# Patient Record
Sex: Male | Born: 1970
Health system: Southern US, Community
[De-identification: ages and names within clinical notes are randomized; demographics above are authoritative.]

## PROBLEM LIST (undated history)

## (undated) ENCOUNTER — Emergency Department (HOSPITAL_COMMUNITY): Admission: EM | Discharge status: Absent without leave | Payer: Medicare HMO

## (undated) DIAGNOSIS — K219 Gastro-esophageal reflux disease without esophagitis: Secondary | ICD-10-CM

## (undated) DIAGNOSIS — K209 Esophagitis, unspecified without bleeding: Secondary | ICD-10-CM

## (undated) DIAGNOSIS — I10 Essential (primary) hypertension: Secondary | ICD-10-CM

## (undated) DIAGNOSIS — D649 Anemia, unspecified: Secondary | ICD-10-CM

## (undated) DIAGNOSIS — G629 Polyneuropathy, unspecified: Secondary | ICD-10-CM

## (undated) DIAGNOSIS — R112 Nausea with vomiting, unspecified: Secondary | ICD-10-CM

## (undated) DIAGNOSIS — A048 Other specified bacterial intestinal infections: Secondary | ICD-10-CM

## (undated) DIAGNOSIS — K2970 Gastritis, unspecified, without bleeding: Secondary | ICD-10-CM

## (undated) DIAGNOSIS — H409 Unspecified glaucoma: Secondary | ICD-10-CM

## (undated) DIAGNOSIS — N186 End stage renal disease: Secondary | ICD-10-CM

## (undated) DIAGNOSIS — R109 Unspecified abdominal pain: Secondary | ICD-10-CM

## (undated) DIAGNOSIS — G8929 Other chronic pain: Secondary | ICD-10-CM

## (undated) DIAGNOSIS — E118 Type 2 diabetes mellitus with unspecified complications: Secondary | ICD-10-CM

## (undated) DIAGNOSIS — F419 Anxiety disorder, unspecified: Secondary | ICD-10-CM

## (undated) DIAGNOSIS — H5789 Other specified disorders of eye and adnexa: Secondary | ICD-10-CM

## (undated) DIAGNOSIS — F32A Depression, unspecified: Secondary | ICD-10-CM

## (undated) DIAGNOSIS — I509 Heart failure, unspecified: Secondary | ICD-10-CM

## (undated) DIAGNOSIS — F329 Major depressive disorder, single episode, unspecified: Secondary | ICD-10-CM

## (undated) DIAGNOSIS — Z992 Dependence on renal dialysis: Secondary | ICD-10-CM

## (undated) DIAGNOSIS — N289 Disorder of kidney and ureter, unspecified: Secondary | ICD-10-CM

## (undated) DIAGNOSIS — K3184 Gastroparesis: Secondary | ICD-10-CM

## (undated) DIAGNOSIS — R066 Hiccough: Secondary | ICD-10-CM

## (undated) HISTORY — DX: Type 2 diabetes mellitus with unspecified complications: E11.8

## (undated) HISTORY — PX: AMPUTATION TOE: SHX6595

## (undated) HISTORY — PX: EYE SURGERY: SHX253

## (undated) HISTORY — DX: End stage renal disease: N18.6

## (undated) HISTORY — DX: End stage renal disease: Z99.2

## (undated) HISTORY — DX: Other specified bacterial intestinal infections: A04.8

## (undated) SURGERY — Surgical Case
Anesthesia: *Unknown

---
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
## 2008-09-30 ENCOUNTER — Encounter: Payer: Self-pay | Admitting: Endocrinology

## 2009-03-22 ENCOUNTER — Ambulatory Visit: Payer: Self-pay | Admitting: Endocrinology

## 2009-03-22 DIAGNOSIS — F329 Major depressive disorder, single episode, unspecified: Secondary | ICD-10-CM

## 2009-03-22 DIAGNOSIS — N186 End stage renal disease: Secondary | ICD-10-CM | POA: Insufficient documentation

## 2009-03-22 DIAGNOSIS — E118 Type 2 diabetes mellitus with unspecified complications: Secondary | ICD-10-CM

## 2009-03-22 DIAGNOSIS — E1122 Type 2 diabetes mellitus with diabetic chronic kidney disease: Secondary | ICD-10-CM | POA: Insufficient documentation

## 2009-03-22 DIAGNOSIS — E785 Hyperlipidemia, unspecified: Secondary | ICD-10-CM | POA: Insufficient documentation

## 2009-03-22 DIAGNOSIS — F32A Depression, unspecified: Secondary | ICD-10-CM | POA: Insufficient documentation

## 2009-03-22 DIAGNOSIS — E1165 Type 2 diabetes mellitus with hyperglycemia: Secondary | ICD-10-CM | POA: Insufficient documentation

## 2009-04-26 ENCOUNTER — Ambulatory Visit: Payer: Self-pay | Admitting: Endocrinology

## 2009-05-10 ENCOUNTER — Ambulatory Visit: Payer: Self-pay | Admitting: Endocrinology

## 2009-05-31 ENCOUNTER — Ambulatory Visit: Payer: Self-pay | Admitting: Endocrinology

## 2009-07-20 ENCOUNTER — Ambulatory Visit: Payer: Self-pay | Admitting: Endocrinology

## 2009-07-20 DIAGNOSIS — R209 Unspecified disturbances of skin sensation: Secondary | ICD-10-CM | POA: Insufficient documentation

## 2009-07-20 LAB — CONVERTED CEMR LAB
Folate: 10 ng/mL
Fructosamine: 515 micromoles/L — ABNORMAL HIGH (ref <285)
TSH: 1.47 microintl units/mL (ref 0.35–5.50)
Vitamin B-12: 386 pg/mL (ref 211–911)

## 2009-10-19 ENCOUNTER — Ambulatory Visit: Payer: Self-pay | Admitting: Endocrinology

## 2009-10-19 DIAGNOSIS — I739 Peripheral vascular disease, unspecified: Secondary | ICD-10-CM | POA: Insufficient documentation

## 2009-10-19 DIAGNOSIS — N529 Male erectile dysfunction, unspecified: Secondary | ICD-10-CM | POA: Insufficient documentation

## 2009-10-19 LAB — CONVERTED CEMR LAB
ALT: 17 units/L (ref 0–53)
AST: 16 units/L (ref 0–37)
Albumin: 4.2 g/dL (ref 3.5–5.2)
Alkaline Phosphatase: 63 units/L (ref 39–117)
Bilirubin, Direct: 0.2 mg/dL (ref 0.0–0.3)
Cholesterol: 263 mg/dL — ABNORMAL HIGH (ref 0–200)
Direct LDL: 142.1 mg/dL
HDL: 46.3 mg/dL (ref >39.00)
Hgb A1c MFr Bld: 12.7 % — ABNORMAL HIGH (ref 4.6–6.5)
Total Bilirubin: 0.8 mg/dL (ref 0.3–1.2)
Total CHOL/HDL Ratio: 6
Total Protein: 7.9 g/dL (ref 6.0–8.3)
Triglycerides: 421 mg/dL — ABNORMAL HIGH (ref 0.0–149.0)
VLDL: 84.2 mg/dL — ABNORMAL HIGH (ref 0.0–40.0)

## 2009-11-08 ENCOUNTER — Encounter (INDEPENDENT_AMBULATORY_CARE_PROVIDER_SITE_OTHER): Payer: Self-pay | Admitting: *Deleted

## 2009-12-11 ENCOUNTER — Ambulatory Visit: Payer: Self-pay | Admitting: Cardiovascular Disease

## 2009-12-11 DIAGNOSIS — M79609 Pain in unspecified limb: Secondary | ICD-10-CM | POA: Insufficient documentation

## 2009-12-14 ENCOUNTER — Ambulatory Visit: Payer: Self-pay

## 2009-12-14 ENCOUNTER — Encounter: Payer: Self-pay | Admitting: Cardiology

## 2010-01-14 ENCOUNTER — Ambulatory Visit: Payer: Self-pay | Admitting: Cardiovascular Disease

## 2010-01-31 ENCOUNTER — Encounter: Payer: Self-pay | Admitting: Endocrinology

## 2010-02-14 ENCOUNTER — Ambulatory Visit: Payer: Self-pay | Admitting: Endocrinology

## 2010-02-14 DIAGNOSIS — E11628 Type 2 diabetes mellitus with other skin complications: Secondary | ICD-10-CM | POA: Insufficient documentation

## 2010-02-14 DIAGNOSIS — L089 Local infection of the skin and subcutaneous tissue, unspecified: Secondary | ICD-10-CM

## 2010-09-10 NOTE — Assessment & Plan Note (Signed)
Summary: NEW ENDO/UNCONTROL DIABETES/MEDCOST/DR TERRY DANIEL/CD   Vital Signs:  Patient profile:   40 year old male Height:      72 inches Weight:      251 pounds BMI:     34.16 O2 Sat:      98 % Temp:     99.1 degrees F oral Pulse rate:   114 / minute BP sitting:   128 / 88  (left arm) Cuff size:   large  Vitals Entered By: Charlynne Cousins CMA (March 22, 2009 3:57 PM) CC: New Endo consult for Diabetic management/ ab   Referring Provider:  Clemmie Krill Primary Provider:  Clemmie Krill  CC:  New Endo consult for Diabetic management/ ab.  History of Present Illness: pt states 10 years h/o dm.  he denies knowing of any chronic complications.  he has been on insulin x 8 years.  he takes lantus 60 units bid, glyburide, and merformin .  cbg's have increased to the 200's recently.  it is in general higher later in the day. pt says his diet and exercise are "fair."  symptomatically, pt states 8 mos of moderate pain at the lower extremities, and associated diarrhea.  he attributes the diarrhea to the metformin.     Current Medications (verified): 1)  Metformin Hcl 500 Mg Tabs (Metformin Hcl) .... 4 Tabs A Day 2)  Lantus 100 Unit/ml Soln (Insulin Glargine) .... 60 Units Two Times A Day 3)  Glyburide 5 Mg Tabs (Glyburide) .[name].. 1 Tab Two Times A Day 4)  Simvastatin 20 Mg Tabs (Simvastatin) .... Take 1 Tab Once A Day  Allergies (verified): 1)  ! Asa 2)  ! Penicillin  Past History:  Past Medical History: HYPERLIPIDEMIA (ICD-272.4) DEPRESSIVE DISORDER (ICD-311) DIABETES MELLITUS TYPE 2-UNCONTROLLED (ICD-250.02)  Family History: Reviewed history and no changes required. Prostate Cancer in Father and Grandfather Heart Disease - parents Hypertension-grandparents Family Hx of Diabetes  Social History: Reviewed history and no changes required. 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         pt reports erectile dysfunction, cramps, excessive diaphoresis, and polyuria denies blurry vision, headache, chest pain, cough, n/v, urinary frequency, excessive diaphoresis, memory loss, hypoglycemia, and easy bruising.    Physical Exam  General:  obese.   Head:  head: no deformity eyes: no periorbital swelling, no proptosis external nose and ears are normal mouth: no lesion seen  Neck:  Supple without thyroid enlargement or tenderness. No cervical lymphadenopathy, neck masses or tracheal deviation.  Lungs:  Clear to auscultation bilaterally. Normal respiratory effort.  Heart:  Regular rate and rhythm without murmurs or gallops noted. Normal S1,S2.   Abdomen:  abdomen is soft, nontender.  no hepatosplenomegaly.   not distended.  no hernia  Msk:  muscle bulk and strength are grossly normal.  no obvious joint swelling.  gait is normal and steady  Pulses:  dorsalis pedis intact bilat.  no carotid bruit  Extremities:  no deformity.  no ulcer on the feet.  feet are of normal color and temp.  no edema  Neurologic:  cn 2-12 grossly intact.   readily moves all 4's.   sensation is intact to touch on the feet, but decreased from normal Skin:  normal texture and temp.  no rash.  not diaphoretic  Cervical Nodes:  No significant  adenopathy.  Psych:  Alert and cooperative; normal mood and affect; normal attention span and concentration.   Additional Exam:  outside test results are reviewed:  09/30/08: a1c=11.7   Impression & Recommendations:  Problem # 1:  DIABETES MELLITUS TYPE 2-UNCONTROLLED (ICD-250.02) needs increased rx  Problem # 2:  erectile dysfunction prob due to #1  Problem # 3:  diarrhea prob due to metformin  Problem # 4:  leg pain prob neuropathic  Medications Added to Medication List This Visit: 1)  Lantus 100 Unit/ml Soln (Insulin glargine) .... 60 units two times a day 2)  Lantus 100 Unit/ml Soln (Insulin  glargine) .... 80 units two times a day  Other Orders: Consultation Level IV [id number]  Patient Instructions: 1)  we discussed the importance of diet and exercise therapy and the risks of diabetes.  you should see an eye doctor every year. 2)  i told pt we will need to take this complex situation in stages 3)  it is very important to keep good control of blood pressure and cholesterol, especially in those with diabetes.  please discuss these with your doctor.  you should take an aspirin every day, unless you have been advised by a doctor not to. 4)  check your blood sugar 2 times a day.  vary the time of day when you check, between before the 3 meals, and at bedtime.  also check if you have symptoms of your blood sugar being too high or too low.  please keep a record of the readings and bring it to your next appointment here.  please call us sooner if you are having low blood sugar episodes. 5)  stop metformin and glyburide 6)  increase lantus to 80 units two times a day 7)  if your sugar is still high, increase to 90 units two times a day.  8)  Please schedule a follow-up appointment in 2 weeks.

## 2010-09-10 NOTE — Assessment & Plan Note (Signed)
Summary: 3wk f/u sl   Visit Type:  Follow-up Referring Provider:  Clemmie Krill Primary Provider:  Clemmie Krill  CC:  Bilateral foot pain and ulceration right foot.  History of Present Illness: 40 yo AAM with history of DM and hyperlipidemia  here  today for PV follow up. He was seen as a new pt several weeks ago. He described fatigue and "tightness" in legs at the end of the day.  There is one spot over the anterior aspect of his right leg that aches with exercise. X ray of right foot showed severe calcification. He was told by a podiatrist that he had a fracture of his foot. He was taken out of work but the pain in his feet has persisted. No edema.  I ordered non-invasive studies to assess.   He is here today for follow up and tells me that he has developed an ulceration on the inner aspect of his right foot at the base of his great toe. He is on antiobiotics and is being seen in the wound center. His leg pain is unchanged. Non-invasive studies with normal ABI bilaterally and brisk, triphasic flow in both legs. No evidence of obstructive arterial disease.    Current Medications (verified): 1)  Benicar 20 Mg Tabs (Olmesartan Medoxomil) .[name].. 1 Qd 2)  Relion R 100 Units .[name].. 100 Units Two Times A Day 3)  Cialis 20 Mg Tabs (Tadalafil) .... As Needed Use 4)  Crestor 40 Mg Tabs (Rosuvastatin Calcium) .[name].. 1 Once Daily 5)  Ciprofloxacin Hcl 500 Mg Tabs (Ciprofloxacin Hcl) .... Take One Tablet Two Times A Day For 10 Days  Allergies (verified): 1)  ! Asa 2)  ! Penicillin  Past History:  Past Medical History: Leg pain-? secondary to neuropathy HYPERLIPIDEMIA (ICD-272.4) NUMBNESS (ICD-782.0) DEPRESSIVE DISORDER (ICD-311) DIABETES MELLITUS TYPE 2-UNCONTROLLED (ICD-250.02) ENCOUNTER FOR LONG-TERM USE OF OTHER MEDICATIONS (ICD-V58.69) ERECTILE DYSFUNCTION, ORGANIC (ICD-607.84) INADEQUATE MATERIAL RESOURCES (ICD-V60.2)  Social History: Reviewed history from 12/11/2009 and no  changes required. 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  The patient denies fatigue, malaise, fever, weight gain/loss, vision loss, decreased hearing, hoarseness, chest pain, palpitations, shortness of breath, prolonged cough, wheezing, sleep apnea, coughing up blood, abdominal pain, blood in stool, nausea, vomiting, diarrhea, heartburn, incontinence, blood in urine, muscle weakness, leg swelling, rash, skin lesions, headache, fainting, dizziness, depression, anxiety, enlarged lymph nodes, easy bruising or bleeding, and environmental allergies.         See HPI. Right foot ulcer. Bilateral foot pain.   Vital Signs:  Patient profile:   40 year old male Height:      72 inches Weight:      238 pounds BMI:     32.40 Pulse rate:   90 / minute Pulse rhythm:   regular BP sitting:   144 / 92  (left arm) Cuff size:   regular  Vitals Entered By: Doug Sou CMA (January 14, 2010 2:01 PM)  Physical Exam  General:  General: Well developed, well nourished, NAD SKIN: warm, dry. Bandage on right foot. Neuro: No focal deficits Musculoskeletal: Muscle strength 5/5 all ext Psychiatric: Mood and affect normal Lungs:Clear bilaterally, no wheezes, rhonci, crackles CV: RRR no murmurs, gallops rubs Abdomen: soft, NT, ND, BS present Extremities: No edema, pulses 1+ bilaterally.     Arterial Doppler  Procedure date:  12/14/2009  Findings:      Bilateral CFA, popliteal, DP and PT waveforms triphasic and brisk.  ABI 1.0 bilaterally  Impression & Recommendations:  Problem # 1:  LIMB PAIN (ICD-729.5) No evidence of obstructive arterial disease by doppler imaging. ABI normal on right and left. No further vascular workup at this time. He is following in the wound clinic and with primary care for his right foot ulceration. Likely secondary to diabetic neuropathy. Local  wound care.  Patient Instructions: 1)  Your physician recommends that you schedule a follow-up appointment as needed

## 2010-09-10 NOTE — Assessment & Plan Note (Signed)
Summary: NPV   Visit Type:  npv Referring Provider:  Clemmie Krill Primary Provider:  Clemmie Krill  CC:  Leg pain with walking and constant aching feet.  History of Present Illness: 40 yo AAM with history of DM and hyperlipidemia  referred today for PV evaluation by Dr. Loanne Drilling. He describes fatigue and "tightness" in legs at the end of the day. His calf muscles tighten up. He can walk 2-3 minutes before his calf muscles begin to ache. His legs feel numb after walking. No rest pain. The right leg seems to ache more. There is one spot over the anterior aspect of his right leg that aches with exercise. X ray of right foot showed severe calcification. He was told by a podiatrist that he had a fracture of his foot. He was taken out of work but the pain in his feet has persisted. No edema.  Problems Prior to Update: 1)  Unspecified Peripheral Vascular Disease  (ICD-443.9) 2)  Hyperlipidemia  (ICD-272.4) 3)  Numbness  (ICD-782.0) 4)  Depressive Disorder  (ICD-311) 5)  Diabetes Mellitus Type 2-uncontrolled  (ICD-250.02) 6)  Encounter For Long-term Use of Other Medications  (ICD-V58.69) 7)  Erectile Dysfunction, Organic  (ICD-607.84) 8)  Inadequate Material Resources  (ICD-V60.2)  Current Medications (verified): 1)  Benicar 20 Mg Tabs (Olmesartan Medoxomil) .[name].. 1 Qd 2)  Relion R 100 Units .[name].. 100 Units Two Times A Day 3)  Cialis 20 Mg Tabs (Tadalafil) .... As Needed Use 4)  Crestor 40 Mg Tabs (Rosuvastatin Calcium) .[name].. 1 Once Daily  Allergies: 1)  ! Asa 2)  ! Penicillin  Past History:  Past Surgical History: None  Family History: Prostate Cancer in Father and Grandfather Heart Disease - parents Hypertension-grandparents Family Hx of Diabetes  Sister DM, deceased. 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  The patient denies fatigue, malaise, fever, weight gain/loss, vision loss, decreased hearing, hoarseness, chest pain, palpitations, shortness of breath, prolonged cough, wheezing, sleep apnea, coughing up blood, abdominal pain, blood in stool, nausea, vomiting, diarrhea, heartburn, incontinence, blood in urine, muscle weakness, leg swelling, rash, skin lesions, headache, fainting, dizziness, depression, anxiety, enlarged lymph nodes, easy bruising or bleeding, and environmental allergies.    Vital Signs:  Patient profile:   40 year old male Height:      72 inches Weight:      236 pounds Pulse rate:   87 / minute BP sitting:   152 / 99 Cuff size:   large  Vitals Entered By: Lynden Ang (Dec 11, 2009 9:29 AM)  Physical Exam  General:  General: Well developed, well nourished, NAD HEENT: OP clear, mucus membranes moist SKIN: warm, dry Neuro: No focal deficits Musculoskeletal: Muscle strength 5/5 all ext Psychiatric: Mood and affect normal Neck: No JVD, no carotid bruits, no thyromegaly, no lymphadenopathy. Lungs:Clear bilaterally, no wheezes, rhonci, crackles CV: RRR no murmurs, gallops rubs Abdomen: soft, NT, ND, BS present Extremities: No edema, pedal pulses non-palpable.     EKG  Procedure date:  12/11/2009  Findings:      NSR, rate 87 bpm. Non-specific T wave abnormalities.   Impression & Recommendations:  Problem # 1:  LIMB PAIN (ICD-729.5) His symptoms are c/w neuropathy with features of possible claudication.  On exam, he does have decreased pulses.  I am able to doppler the DP and PT bilaterally. He has insulin dependent diabetes which increases his risk of PVD. Will arrange bilateral lower extremity arterial dopplers to assess.   Other Orders: EKG w/ Interpretation (93000) Arterial Duplex Lower Extremity (Arterial Duplex Low)  Patient Instructions: 1)  Your physician  recommends that you schedule a follow-up appointment in: 2-3 weeks 2)  Your physician recommends that you continue on your current medications as directed. Please refer to the Current Medication list given to you today. 3)  Your physician has requested that you have a lower or upper extremity arterial duplex.  This test is an ultrasound of the arteries in the legs or arms.  It looks at arterial blood flow in the legs and arms.  Allow one hour for Lower and Upper Arterial scans. There are no restrictions or special instructions.

## 2010-09-10 NOTE — Assessment & Plan Note (Signed)
Summary: 3 MTH FU STC   Vital Signs:  Patient profile:   40 year old male Height:      72 inches (182.88 cm) Weight:      239.13 pounds (108.70 kg) O2 Sat:      96 % on Room air Temp:     97.1 degrees F (36.17 degrees C) oral Pulse rate:   92 / minute BP sitting:   128 / 90  (left arm) Cuff size:   large  Vitals Entered By: Gardenia Phlegm RMA (October 19, 2009 3:49 PM)  O2 Flow:  Room air  CC: 3 month follow up/ CF Is Patient Diabetic? Yes   Referring Provider:  Clemmie Krill Primary Provider:  Clemmie Krill  CC:  3 month follow up/ CF.  History of Present Illness: the status of 3 chronic medical problems is addressed today: dm:  no cbg record, but states cbg's are mid-100's.  he says it is in general higher as the day goes on.   atherosclerosis:  pt says he was incidentally found on x ray of his right foot to have calcified vessels.  he has leg cramps with walking. dyslipidemia:  he takes and tolerates crestor as rx'ed.  he wants to recheck cholesterol while he is having a1c today.  Current Medications (verified): 1)  Crestor 10 Mg Tabs (Rosuvastatin Calcium) .[name].. 1 Tab Qd 2)  Benicar 20 Mg Tabs (Olmesartan Medoxomil) .[name].. 1 Qd 3)  Relion R 100 Unit/ml Soln (Insulin Regular Human) .... 75 Units Three Times A Day (Qac)  Allergies (verified): 1)  ! Asa 2)  ! Penicillin  Past History:  Past Medical History: Last updated: 03/22/2009 HYPERLIPIDEMIA (ICD-272.4) DEPRESSIVE DISORDER (ICD-311) DIABETES MELLITUS TYPE 2-UNCONTROLLED (ICD-250.02)  Review of Systems  The patient denies hypoglycemia.         he has lost weight, due to his efforts  Physical Exam  General:  normal appearance.   Extremities:  has a splint on the right leg and foot. Additional Exam:  Hemoglobin A1C       [H]  12.7 %                      4.6-6.5 Cholesterol LDL        142.1 mg/dL   Impression & Recommendations:  Problem # 1:  DIABETES MELLITUS TYPE 2-UNCONTROLLED  (ICD-250.02) needs increased rx  Problem # 2:  UNSPECIFIED PERIPHERAL VASCULAR DISEASE (ICD-443.9)  Problem # 3:  HYPERLIPIDEMIA (ICD-272.4) needs increased rx  Medications Added to Medication List This Visit: 1)  Humulin R 100 Unit/ml Soln (Insulin regular human) .... 85 units three times a day (just before each meal) 2)  Humulin R 100 Unit/ml Soln (Insulin regular human) .[name].. 100 units three times a day (just before each meal) 3)  Cialis 20 Mg Tabs (Tadalafil) .... As needed use 4)  Crestor 40 Mg Tabs (Rosuvastatin calcium) .[name].. 1 once daily  Other Orders: EKG w/ Interpretation (93000) Cardiology Referral (Cardiology) TLB-Lipid Panel (80061-LIPID) TLB-Hepatic/Liver Function Pnl (80076-HEPATIC) TLB-A1C / Hgb A1C (Glycohemoglobin) (83036-A1C) Est. Patient Level IV [id number])  Patient Instructions: 1)  check your blood sugar 2 times a day.  vary the time of day when you check, between before the 3 meals, and at bedtime.  also check if you have symptoms of your blood sugar being too high or too low.  please keep a record of the readings and bring it to your next appointment here.  please call us sooner if you  are having low blood sugar episodes. 2)  Please schedule a follow-up appointment in 3 months. 3)  tests are being ordered for you today.  a few days after the test(s), please call [phone] to hear your test results. 4)  pending the test results, please increase regular insulin to 85 units three times a day (just before each meal). 5)  the treatment for hardening of the arteries is this:  don't smoke.  control diabetes, cholesterol, and blood pressure.   6)  refer peripheral artery specialist. 7)  (update: i left message on phone-tree:  increase crestor to 40 mg once daily.  increase reular insulin to 100 units three times a day (just before each meal).  return 1 month rather than 3). Prescriptions: CRESTOR 40 MG TABS (ROSUVASTATIN CALCIUM) 1 once daily  #30 x 11   Entered and  Authorized by:   Donavan Foil MD   Signed by:   Donavan Foil MD on 10/20/2009   Method used:   Electronically to        [facility] (retail)       [address], [place]  [address]       Ph: [phone]       Fax: [phone]   RxID:   [id number] HUMULIN R 100 UNIT/ML SOLN (INSULIN REGULAR HUMAN) 85 units three times a day (just before each meal)  #9 vials x 11   Entered and Authorized by:   Donavan Foil MD   Signed by:   Donavan Foil MD on 10/19/2009   Method used:   Electronically to        [facility] (retail)       [address]       [place], [place]  [address]       Ph: [phone]       Fax: [phone]   RxID:   [id number]

## 2010-09-10 NOTE — Assessment & Plan Note (Signed)
Summary: 2 WK ROV /NWS   Vital Signs:  Patient profile:   40 year old male Height:      72 inches (182.88 cm) Weight:      254.38 pounds (115.63 kg) O2 Sat:      96 % on Room air Temp:     98.2 degrees F (36.78 degrees C) oral Pulse rate:   93 / minute BP sitting:   120 / 90  (left arm) Cuff size:   large  Vitals Entered By: Gardenia Phlegm CMA (May 10, 2009 3:57 PM)  O2 Flow:  Room air CC: 2 wk f/u/ CF Is Patient Diabetic? Yes   Referring Provider:  Clemmie Krill Primary Provider:  Clemmie Krill  CC:  2 wk f/u/ CF.  History of Present Illness: pt says cbg's have all been in the mid-to high-100's.  pt states he feels well in general.  Anticoagulation Management History:      Positive risk factors for bleeding include presence of serious comorbidities.  Negative risk factors for bleeding include an age less than 7 years old.  The bleeding index is 'intermediate risk'.  Positive CHADS2 values include History of Diabetes.  Negative CHADS2 values include Age > 40 years old.    Current Medications (verified): 1)  Lantus 100 Unit/ml Soln (Insulin Glargine) .... 90 Units Two Times A Day 2)  Crestor 10 Mg Tabs (Rosuvastatin Calcium) .[name].. 1 Tab Qd  Allergies (verified): 1)  ! Asa 2)  ! Penicillin  Past History:  Past Medical History: Last updated: 03/22/2009 HYPERLIPIDEMIA (ICD-272.4) DEPRESSIVE DISORDER (ICD-311) DIABETES MELLITUS TYPE 2-UNCONTROLLED (ICD-250.02)  Review of Systems  The patient denies hypoglycemia.    Physical Exam  General:  obese.  no distress  Psych:  Alert and cooperative; normal mood and affect; normal attention span and concentration.     Impression & Recommendations:  Problem # 1:  DIABETES MELLITUS TYPE 2-UNCONTROLLED (ICD-250.02) Assessment Improved needs increased rx  Medications Added to Medication List This Visit: 1)  Lantus 100 Unit/ml Soln (Insulin glargine) .[name].. 100 units two times a day 2)  Benicar 20 Mg Tabs  (Olmesartan medoxomil) .[name].. 1 qd  Other Orders: Est. Patient Level III [id number])   Patient Instructions: 1)  increase lantus to 100 units two times a day. 2)  check your blood sugar 2 times a day.  vary the time of day when you check, between before the 3 meals, and at bedtime.  also check if you have symptoms of your blood sugar being too high or too low.  please keep a record of the readings and bring it to your next appointment here.  please call us sooner if you are having low blood sugar episodes. 3)  return 3 weeks 4)  i told pt we will need to take this complex situation in stages, which would probably mean a different insulin regimen Prescriptions: LANTUS 100 UNIT/ML SOLN (INSULIN GLARGINE) 100 units two times a day  #7 vials x 11   Entered and Authorized by:   Donavan Foil MD   Signed by:   Donavan Foil MD on 05/10/2009   Method used:   Electronically to        [facility] (retail)       [address]       [place], [place]  [address]       Ph: [phone]       Fax: [phone]  [id number]

## 2010-09-10 NOTE — Assessment & Plan Note (Signed)
Summary: 2 WK ROV Timothy Clark $50   Vital Signs:  Patient profile:   40 year old male Height:      72 inches Weight:      258 pounds BMI:     35.12 O2 Sat:      98 % on Room air Temp:     97.1 degrees F oral Pulse rate:   89 / minute BP sitting:   118 / 50  (left arm) Cuff size:   large  Vitals Entered By: Charlynne Cousins CMA (April 26, 2009 3:06 PM)  O2 Flow:  Room air CC: pt here for follow up on diabetes and states his CBGs are running in the mid to high 100's with an occasional spike in the mid 200's with no consistent pattern/ pt does not have his CBG journal/ ab   Referring Provider:  Clemmie Krill Primary Provider:  Clemmie Krill  CC:  pt here for follow up on diabetes and states his CBGs are running in the mid to high 100's with an occasional spike in the mid 200's with no consistent pattern/ pt does not have his CBG journal/ ab.  History of Present Illness: pt is now off diabetes pills.  he takes lantus 80 units two times a day.  it varies from 150-250.  it is highest in the afternoon.  Current Medications (verified): 1)  Lantus 100 Unit/ml Soln (Insulin Glargine) .... 60 Units Two Times A Day 2)  Lantus 100 Unit/ml Soln (Insulin Glargine) .... 80 Units Two Times A Day 3)  Crestor 10 Mg Tabs (Rosuvastatin Calcium) .[name].. 1 Tab Qd  Allergies (verified): 1)  ! Asa 2)  ! Penicillin  Past History:  Past Medical History: Last updated: 03/22/2009 HYPERLIPIDEMIA (ICD-272.4) DEPRESSIVE DISORDER (ICD-311) DIABETES MELLITUS TYPE 2-UNCONTROLLED (ICD-250.02)  Social History: Reviewed history from 03/22/2009 and no changes required. 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    Physical Exam  General:  obese.  no distress  Skin:  insulin injection sites at anterior thighs are normal    Impression & Recommendations:  Problem # 1:  DIABETES MELLITUS TYPE 2-UNCONTROLLED  (ICD-250.02) needs increased rx  Medications Added to Medication List This Visit: 1)  Lantus 100 Unit/ml Soln (Insulin glargine) .... 90 units two times a day 2)  Crestor 10 Mg Tabs (Rosuvastatin calcium) .[name].. 1 tab qd  Other Orders: Est. Patient Level III [id number])  Patient Instructions: 1)  for now, increase lantus to 90 units two times a day 2)  Please schedule a follow-up appointment in 2 weeks. 3)  i told pt we will need to take this complex situation in stages.

## 2010-09-10 NOTE — Assessment & Plan Note (Signed)
Summary: 3 month follow up-lb   Vital Signs:  Patient profile:   40 year old male Height:      72 inches (182.88 cm) Weight:      237.38 pounds (107.90 kg) BMI:     32.31 O2 Sat:      98 % on Room air Temp:     98.0 degrees F (36.67 degrees C) oral Pulse rate:   97 / minute BP sitting:   132 / 90  (left arm) Cuff size:   regular  Vitals Entered By: Rebeca Alert MA (February 14, 2010 2:58 PM)  O2 Flow:  Room air CC: 3 mo f/u/pt recently stopped taking crestor/aj   Referring Provider:  Clemmie Krill Primary Provider:  Clemmie Krill  CC:  3 mo f/u/pt recently stopped taking crestor/aj.  History of Present Illness: pt is taking his regular insulin, 100 units only two times a day.  pt states he feels well in general, except for fatigue.  no cbg record, but states cbg's vary from 103-210.    Current Medications (verified): 1)  Benicar 20 Mg Tabs (Olmesartan Medoxomil) .[name].. 1 Qd 2)  Relion R 100 Units .[name].. 100 Units Two Times A Day 3)  Cialis 20 Mg Tabs (Tadalafil) .... As Needed Use 4)  Crestor 40 Mg Tabs (Rosuvastatin Calcium) .[name].. 1 Once Daily 5)  Ciprofloxacin Hcl 500 Mg Tabs (Ciprofloxacin Hcl) .... Take One Tablet Two Times A Day For 10 Days  Allergies (verified): 1)  ! Asa 2)  ! Penicillin  Past History:  Past Medical History: Last updated: 01/14/2010 Leg pain-? secondary to neuropathy HYPERLIPIDEMIA (ICD-272.4) NUMBNESS (ICD-782.0) DEPRESSIVE DISORDER (ICD-311) DIABETES MELLITUS TYPE 2-UNCONTROLLED (ICD-250.02) ENCOUNTER FOR LONG-TERM USE OF OTHER MEDICATIONS (ICD-V58.69) ERECTILE DYSFUNCTION, ORGANIC (ICD-607.84) INADEQUATE MATERIAL RESOURCES (ICD-V60.2)  Review of Systems  The patient denies hypoglycemia.    Physical Exam  General:  normal appearance.   Neck:  Supple without thyroid enlargement or tenderness.  Additional Exam:  a1c=14%   Impression & Recommendations: needs increased rx a1c is much higher than reported cbg's.  i offered to  check fructosamine--pt declines.    Medications Added to Medication List This Visit: 1)  Humulin 70/30 70-30 % Susp (Insulin isophane & regular) .[name].. 125 units two times a day  Patient Instructions: 1)  change current insulin to humulin 70/30, 125 units two times a day. 2)  check your blood sugar 2 times a day.  vary the time of day when you check, between before the 3 meals, and at bedtime.  also check if you have symptoms of your blood sugar being too high or too low.  please keep a record of the readings and bring it to your next appointment here.  please call us sooner if you are having low blood sugar episodes. 3)  Please schedule a follow-up appointment in 1 month. Prescriptions: HUMULIN 70/30 70-30 % SUSP (INSULIN ISOPHANE & REGULAR) 125 units two times a day  #8 vials x 11   Entered and Authorized by:   Donavan Foil MD   Signed by:   Donavan Foil MD on 02/14/2010   Method used:   Electronically to        [facility] (retail)       [address]       [place], [place]  [address]       Ph: [phone]       Fax: [phone]  [id number]   Appended Document: Orders Update    Clinical Lists Changes  Orders: Added new Service order of Est. Patient Level III [id number]) - Signed      Appended Document: 3 month follow up-lb next line after impression should say: diabetes

## 2010-09-10 NOTE — Assessment & Plan Note (Signed)
Summary: 5-6 WK FU--PHONE  STC     Vital Signs:  Patient profile:   40 year old male Height:      72 inches (182.88 cm) Weight:      246.50 pounds (112.05 kg) O2 Sat:      98 % on Room air Temp:     97.6 degrees F (36.44 degrees C) oral Pulse rate:   86 / minute BP sitting:   140 / 92  (left arm) Cuff size:   large  Vitals Entered By: Gardenia Phlegm CMA (July 20, 2009 2:57 PM)  O2 Flow:  Room air CC: 5-6 week follow up/ CF Is Patient Diabetic? Yes   Referring Provider:  Clemmie Krill Primary Provider:  Clemmie Krill  CC:  5-6 week follow up/ CF.  History of Present Illness: no cbg record, but states cbg's are mid-100's.  he says there is no trend throughout the day.   he has few mos of anxiety and insomnia, but no associated pain in the head. he also c/o persistent numbness of the feet .  Current Medications (verified): 1)  Crestor 10 Mg Tabs (Rosuvastatin Calcium) .[name].. 1 Tab Qd 2)  Benicar 20 Mg Tabs (Olmesartan Medoxomil) .[name].. 1 Qd 3)  Metronidazole 500 Mg Tabs (Metronidazole) .[name].. 1 Bid 4)  Relion R 100 Unit/ml Soln (Insulin Regular Human) .... 60 Units Three Times A Day (Qac)  Allergies (verified): 1)  ! Asa 2)  ! Penicillin  Past History:  Past Medical History: Last updated: 03/22/2009 HYPERLIPIDEMIA (ICD-272.4) DEPRESSIVE DISORDER (ICD-311) DIABETES MELLITUS TYPE 2-UNCONTROLLED (ICD-250.02)  Review of Systems  The patient denies hypoglycemia.    Physical Exam  General:  normal appearance.  no distress. Pulses:  dorsalis pedis intact bilat.    Extremities:  no deformity.  no ulcer on the feet.  feet are of normal color and temp.  no edema. there is very skin on the feet  Neurologic:  cn 2-12 grossly intact.   readily moves all 4's.   sensation is intact to touch on the feet, but decreased from normal Additional Exam:  fructosamine=515 (converts to a1c of 10.2) FastTSH                   1.47 uIU/mL                 0.35-5.50 Vitamin  B12               386 pg/mL      Impression & Recommendations:  Problem # 1:  DIABETES MELLITUS TYPE 2-UNCONTROLLED (ICD-250.02) needs increased rx  Problem # 2:  DEPRESSIVE DISORDER (ICD-311) this limits rx of #1  Problem # 3:  NUMBNESS (ICD-782.0) prob due to #1  Medications Added to Medication List This Visit: 1)  Relion R 100 Unit/ml Soln (Insulin regular human) .... 65 units three times a day (qac) 2)  Relion R 100 Unit/ml Soln (Insulin regular human) .... 75 units three times a day (qac)  Other Orders: T-Fructosamine [id number]) TLB-TSH (Thyroid Stimulating Hormone) (84443-TSH) TLB-B12 + Folate Pnl [id number]) Est. Patient Level IV [id number])  Patient Instructions: 1)  increase regular insulin to 65 units three times a day (with each meal).  2)  check your blood sugar 2 times a day.  vary the time of day when you check, between before the 3 meals, and at bedtime.  also check if you have symptoms of your blood sugar being too high or too low.  please keep a record of  the readings and bring it to your next appointment here.  please call us sooner if you are having low blood sugar episodes. 3)  Please schedule a follow-up appointment in 3 months. 4)  tests are being ordered for you today.  a few days after the test(s), please call [phone] to hear your test results. 5)  (update: i left message on phone-tree:  increase regular insulin to 75 units three times a day (qac)) Prescriptions: RELION R 100 UNIT/ML SOLN (INSULIN REGULAR HUMAN) 65 units three times a day (qac)  #7 vials x 11   Entered and Authorized by:   Donavan Foil MD   Signed by:   Donavan Foil MD on 07/20/2009   Method used:   Print then Give to Patient   RxID:   [id number]

## 2010-09-10 NOTE — Letter (Signed)
Summary: Appointment - Missed  [facility], [place]  [address]   [place], [place] [address]   Phone: [phone]  Fax: [phone]     November 08, 2009 MRN: [id number]   [place], [place]  [address]   Dear Mr. [facility],  Our records indicate you missed your appointment on     11/05/09                   with Dr.       .   [place]                                 It is very important that we reach you to reschedule this appointment. We look forward to participating in your health care needs. Please contact us at the number listed above at your earliest convenience to reschedule this appointment.     Sincerely,    Public relations account executive

## 2013-08-11 HISTORY — PX: EYE SURGERY: SHX253

## 2013-08-11 HISTORY — PX: ESOPHAGOGASTRODUODENOSCOPY: SHX1529

## 2013-12-30 ENCOUNTER — Ambulatory Visit: Payer: Self-pay | Admitting: Endocrinology

## 2013-12-30 DIAGNOSIS — Z0289 Encounter for other administrative examinations: Secondary | ICD-10-CM

## 2014-01-11 DIAGNOSIS — H4052X0 Glaucoma secondary to other eye disorders, left eye, stage unspecified: Secondary | ICD-10-CM | POA: Insufficient documentation

## 2014-02-06 DIAGNOSIS — E113599 Type 2 diabetes mellitus with proliferative diabetic retinopathy without macular edema, unspecified eye: Secondary | ICD-10-CM | POA: Insufficient documentation

## 2014-02-18 DIAGNOSIS — E11311 Type 2 diabetes mellitus with unspecified diabetic retinopathy with macular edema: Secondary | ICD-10-CM | POA: Insufficient documentation

## 2014-02-20 ENCOUNTER — Emergency Department: Payer: Self-pay | Admitting: Emergency Medicine

## 2014-03-20 ENCOUNTER — Other Ambulatory Visit: Payer: Self-pay

## 2014-03-20 ENCOUNTER — Encounter (HOSPITAL_COMMUNITY): Payer: Self-pay | Admitting: Emergency Medicine

## 2014-03-20 ENCOUNTER — Emergency Department (HOSPITAL_COMMUNITY)
Admission: EM | Admit: 2014-03-20 | Discharge: 2014-03-20 | Disposition: A | Discharge status: Home / Self Care | Payer: PRIVATE HEALTH INSURANCE | Attending: Emergency Medicine | Admitting: Emergency Medicine

## 2014-03-20 DIAGNOSIS — R079 Chest pain, unspecified: Secondary | ICD-10-CM | POA: Diagnosis present

## 2014-03-20 DIAGNOSIS — K219 Gastro-esophageal reflux disease without esophagitis: Secondary | ICD-10-CM | POA: Diagnosis not present

## 2014-03-20 DIAGNOSIS — K2970 Gastritis, unspecified, without bleeding: Secondary | ICD-10-CM | POA: Insufficient documentation

## 2014-03-20 DIAGNOSIS — Z794 Long term (current) use of insulin: Secondary | ICD-10-CM | POA: Diagnosis not present

## 2014-03-20 DIAGNOSIS — K2990 Gastroduodenitis, unspecified, without bleeding: Secondary | ICD-10-CM | POA: Diagnosis not present

## 2014-03-20 DIAGNOSIS — Z880 Allergy status to penicillin: Secondary | ICD-10-CM | POA: Diagnosis not present

## 2014-03-20 DIAGNOSIS — R6883 Chills (without fever): Secondary | ICD-10-CM | POA: Diagnosis not present

## 2014-03-20 DIAGNOSIS — E119 Type 2 diabetes mellitus without complications: Secondary | ICD-10-CM | POA: Insufficient documentation

## 2014-03-20 DIAGNOSIS — Z79899 Other long term (current) drug therapy: Secondary | ICD-10-CM | POA: Insufficient documentation

## 2014-03-20 DIAGNOSIS — Z8669 Personal history of other diseases of the nervous system and sense organs: Secondary | ICD-10-CM | POA: Insufficient documentation

## 2014-03-20 DIAGNOSIS — R0602 Shortness of breath: Secondary | ICD-10-CM | POA: Diagnosis not present

## 2014-03-20 HISTORY — DX: Unspecified glaucoma: H40.9

## 2014-03-20 HISTORY — DX: Other specified disorders of eye and adnexa: H57.89

## 2014-03-20 LAB — BASIC METABOLIC PANEL
Anion gap: 13 (ref 5–15)
BUN: 10 mg/dL (ref 6–23)
CO2: 25 mEq/L (ref 19–32)
Calcium: 8.9 mg/dL (ref 8.4–10.5)
Chloride: 98 mEq/L (ref 96–112)
Creatinine, Ser: 0.87 mg/dL (ref 0.50–1.35)
GFR calc Af Amer: >90 mL/min (ref >90)
GFR calc non Af Amer: >90 mL/min (ref >90)
Glucose, Bld: 316 mg/dL — ABNORMAL HIGH (ref 70–99)
Potassium: 3.8 mEq/L (ref 3.7–5.3)
Sodium: 136 mEq/L — ABNORMAL LOW (ref 137–147)

## 2014-03-20 LAB — CBC WITH DIFFERENTIAL/PLATELET
Basophils Absolute: 0 10*3/uL (ref 0.0–0.1)
Basophils Relative: 0 % (ref 0–1)
Eosinophils Absolute: 0 10*3/uL (ref 0.0–0.7)
Eosinophils Relative: 0 % (ref 0–5)
HCT: 39.4 % (ref 39.0–52.0)
Hemoglobin: 14 g/dL (ref 13.0–17.0)
Lymphocytes Relative: 15 % (ref 12–46)
Lymphs Abs: 1.3 10*3/uL (ref 0.7–4.0)
MCH: 31.3 pg (ref 26.0–34.0)
MCHC: 35.5 g/dL (ref 30.0–36.0)
MCV: 88.1 fL (ref 78.0–100.0)
Monocytes Absolute: 0.5 10*3/uL (ref 0.1–1.0)
Monocytes Relative: 5 % (ref 3–12)
Neutro Abs: 7 10*3/uL (ref 1.7–7.7)
Neutrophils Relative %: 80 % — ABNORMAL HIGH (ref 43–77)
Platelets: 179 10*3/uL (ref 150–400)
RBC: 4.47 MIL/uL (ref 4.22–5.81)
RDW: 12.1 % (ref 11.5–15.5)
WBC: 8.8 10*3/uL (ref 4.0–10.5)

## 2014-03-20 LAB — CBG MONITORING, ED: Glucose-Capillary: 305 mg/dL — ABNORMAL HIGH (ref 70–99)

## 2014-03-20 MED ORDER — PROMETHAZINE HCL 25 MG PO TABS: 25.0000 mg | ORAL_TABLET | Freq: Four times a day (QID) | ORAL | Status: DC | PRN

## 2014-03-20 NOTE — Discharge Instructions (Signed)
For followup with your GI doctor this week. Trial of adding of Phenergan to your regimen. Also recommend taking the Zofran ODT first thing in the morning. Continue the Protonix. Continue to follow your blood sugars. Although you're not been very well if your blood sugars are continuing Neurontin 300 greater may want to take a reduced dose of the insulin or if you're currently taking your regular dose of insulin in your blood sugars running in the 300 continue that.

## 2014-03-20 NOTE — ED Notes (Signed)
Pt reports 2 weeks ago was admitted for 1 week in [place] for gastritis.  Reports was discharged Monday and started feeling bad again yesterday.  Reports n/v since last night and chest pain started on the way here.  Reports chest pain is worse with vomiting.  Wife also reports blood pressure has been elevated.  Pt had eye surgery on July 24.  Pt has also been on steroids and antibiotics approx 1 month ago for glaucoma.

## 2014-03-20 NOTE — ED Provider Notes (Signed)
CSN: [id number]     Arrival date & time 03/20/14  1637 History  This chart was scribed for Timothy Sorrow, MD by Starleen Arms, ED Scribe. This patient was seen in room APA12/APA12 and the patient's care was started at 5:42 PM.    Chief Complaint  Patient presents with  . Chest Pain   The history is provided by the patient. No language interpreter was used.    HPI Comments: Timothy Clark is a 43 y.o. male with a history of DM who presents to the Emergency Department complaining of an acute exacerbation of constant, unchanged abdominal pain with associated nausea and vomiting.  Patient states that the pain is so severe currently that he feels like he is having difficulty breathing.  Patient states he was admitted 2 weeks ago for a period of 1 week at [facility] for gastritis.  The patient was discharged 1 week ago.  Patient states the same symptoms that led to that admission returned last night and he went to the [facility] ED and was discharged home with the same diagnosis.  He was prescribed Protonix but has not been tolerating the medication.  He states he has also been prescribed Zofran for his associated nausea and another unknown medication for his hiccups.  Patient states he has been unable to tolerate food as well as his insulin medication.  The patient also reports nausea, vomiting, and chest pain.  Patient denies diarrhea.    PCP: Jenetta Downer) Internist: Loanne Drilling Past Medical History  Diagnosis Date  . Diabetes mellitus without complication   . Glaucoma   . Eye hemorrhage    Past Surgical History  Procedure Laterality Date  . Eye surgery     No family history on file. History  Substance Use Topics  . Smoking status: Never Smoker   . Smokeless tobacco: Not on file  . Alcohol Use: No    Review of Systems  Constitutional: Positive for chills. Negative for fever.  HENT: Negative for rhinorrhea and sore throat.   Eyes: Negative for visual disturbance.  Respiratory:  Positive for shortness of breath. Negative for cough.   Cardiovascular: Positive for chest pain. Negative for leg swelling.  Gastrointestinal: Positive for nausea, vomiting and abdominal pain. Negative for diarrhea.  Genitourinary: Negative for dysuria.  Musculoskeletal: Negative for back pain and neck pain.  Skin: Negative for rash.  Neurological: Negative for headaches.  Hematological: Does not bruise/bleed easily.  Psychiatric/Behavioral: Negative for confusion.      Allergies  Aspirin; Doxycycline; and Penicillins  Home Medications   Prior to Admission medications   Medication Sig Start Date End Date Taking? Authorizing Provider  cyclobenzaprine (FLEXERIL) 10 MG tablet Take 10 mg by mouth at bedtime as needed for muscle spasms.   Yes Historical Provider, MD  Dapagliflozin Propanediol (FARXIGA) 10 MG TABS Take 10 mg by mouth daily.   Yes Historical Provider, MD  gabapentin (NEURONTIN) 600 MG tablet Take 600 mg by mouth 2 (two) times daily.   Yes Historical Provider, MD  Insulin Isophane & Regular Human (HUMULIN 70/30 KWIKPEN) (70-30) 100 UNIT/ML PEN Inject 35 Units into the skin 2 (two) times daily.   Yes Historical Provider, MD  ondansetron (ZOFRAN) 4 MG tablet Take 4 mg by mouth every 8 (eight) hours as needed for nausea or vomiting.   Yes Historical Provider, MD  pantoprazole (PROTONIX) 40 MG tablet Take 40 mg by mouth daily.   Yes Historical Provider, MD  sulfamethoxazole-trimethoprim (BACTRIM DS,SEPTRA DS) 800-160 MG per tablet Take  2 tablets by mouth 2 (two) times daily. 10 day course starting on 8/6   Yes Historical Provider, MD  promethazine (PHENERGAN) 25 MG tablet Take 1 tablet (25 mg total) by mouth every 6 (six) hours as needed. 03/20/14   Timothy Sorrow, MD   BP 137/93  Pulse 89  Temp(Src) 99.5 F (37.5 C) (Oral)  Resp 12  SpO2 95% Physical Exam  Nursing note and vitals reviewed. Constitutional: He is oriented to person, place, and time. He appears well-developed  and well-nourished. No distress.  HENT:  Head: Normocephalic and atraumatic.  Eyes: Conjunctivae and EOM are normal.  No vision in left eye.    Neck: Neck supple. No tracheal deviation present.  Cardiovascular: Normal rate and regular rhythm.   Pulmonary/Chest: Effort normal and breath sounds normal. No respiratory distress. He has no wheezes. He has no rales.  Abdominal: Bowel sounds are normal. He exhibits no distension. There is no guarding.  Small amount of epigastric tenderness.  Musculoskeletal: Normal range of motion. He exhibits no edema.  Bandage on left ring finger.   Neurological: He is alert and oriented to person, place, and time. No cranial nerve deficit. He exhibits normal muscle tone. Coordination normal.  Skin: Skin is warm and dry.  Psychiatric: He has a normal mood and affect. His behavior is normal.    ED Course  Procedures (including critical care time)  DIAGNOSTIC STUDIES: Oxygen Saturation is 95% on RA, normal by my interpretation.    COORDINATION OF CARE:  5:59 PM Will order Phenergan.  Patient advised to continue with is prescribed Protonix.  Patient acknowledges and agrees with plan.    Labs Review Labs Reviewed  CBC WITH DIFFERENTIAL - Abnormal; Notable for the following:    Neutrophils Relative % 80 (*)    All other components within normal limits  BASIC METABOLIC PANEL - Abnormal; Notable for the following:    Sodium 136 (*)    Glucose, Bld 316 (*)    All other components within normal limits  CBG MONITORING, ED - Abnormal; Notable for the following:    Glucose-Capillary 305 (*)    All other components within normal limits   Results for orders placed during the hospital encounter of 03/20/14  CBC WITH DIFFERENTIAL      Result Value Ref Range   WBC 8.8  4.0 - 10.5 K/uL   RBC 4.47  4.22 - 5.81 MIL/uL   Hemoglobin 14.0  13.0 - 17.0 g/dL   HCT 39.4  39.0 - 52.0 %   MCV 88.1  78.0 - 100.0 fL   MCH 31.3  26.0 - 34.0 pg   MCHC 35.5  30.0 - 36.0  g/dL   RDW 12.1  11.5 - 15.5 %   Platelets 179  150 - 400 K/uL   Neutrophils Relative % 80 (*) 43 - 77 %   Neutro Abs 7.0  1.7 - 7.7 K/uL   Lymphocytes Relative 15  12 - 46 %   Lymphs Abs 1.3  0.7 - 4.0 K/uL   Monocytes Relative 5  3 - 12 %   Monocytes Absolute 0.5  0.1 - 1.0 K/uL   Eosinophils Relative 0  0 - 5 %   Eosinophils Absolute 0.0  0.0 - 0.7 K/uL   Basophils Relative 0  0 - 1 %   Basophils Absolute 0.0  0.0 - 0.1 K/uL  BASIC METABOLIC PANEL      Result Value Ref Range   Sodium 136 (*) 137 - 147  mEq/L   Potassium 3.8  3.7 - 5.3 mEq/L   Chloride 98  96 - 112 mEq/L   CO2 25  19 - 32 mEq/L   Glucose, Bld 316 (*) 70 - 99 mg/dL   BUN 10  6 - 23 mg/dL   Creatinine, Ser 0.87  0.50 - 1.35 mg/dL   Calcium 8.9  8.4 - 10.5 mg/dL   GFR calc non Af Amer >90  >90 mL/min   GFR calc Af Amer >90  >90 mL/min   Anion gap 13  5 - 15  CBG MONITORING, ED      Result Value Ref Range   Glucose-Capillary 305 (*) 70 - 99 mg/dL    Imaging Review No results found.   EKG Interpretation None      Date: 03/20/2014  Rate: 86  Rhythm: normal sinus rhythm  QRS Axis: normal  Intervals: normal  ST/T Wave abnormalities: normal  Conduction Disutrbances:none  Narrative Interpretation:   Old EKG Reviewed: none available   MDM   Final diagnoses:  Gastritis  Gastroesophageal reflux disease, esophagitis presence not specified   Patient presenting for the same problem identified during his admission at [place] to be gastritis with gastroesophageal reflux. Patient is been seen by GI medicine had a upper endoscopy done which confirmed these findings. Started on Protonix. Patient presenting as he still having symptoms. He has not arrange followup with his GI Dr. but was told to his medicine did not help. No other concerning findings. Patient's blood sugars along the high side today. States that he has not been eating sweets not been taking his insulin. T that he could take a  reduced amount of insulin. We'll add Phenergan to his regiment of part of the problem is the vomiting is having trouble keeping the Protonix down. RE has Zofran. Will recommend the patient takes Zofran person in the morning does this when he has most of his nausea and vomiting. Supplemented with the Phenergan continue the Protonix. Continue his other medications.  Labs without any significant abnormalities. Other blood sugar being elevated but no evidence of acidosis. No evidence of significant leukocytosis or anemia. EKG without any acute changes. Patient's vital signs here in the emergency department without any significant abnormalities.    I personally performed the services described in this documentation, which was scribed in my presence. The recorded information has been reviewed and is accurate.     Timothy Sorrow, MD 03/20/14 1820

## 2014-08-11 DIAGNOSIS — A048 Other specified bacterial intestinal infections: Secondary | ICD-10-CM

## 2014-08-11 HISTORY — DX: Other specified bacterial intestinal infections: A04.8

## 2014-10-25 ENCOUNTER — Encounter (HOSPITAL_COMMUNITY): Payer: Self-pay | Admitting: Emergency Medicine

## 2014-10-25 ENCOUNTER — Emergency Department (HOSPITAL_COMMUNITY): Payer: Medicaid Other

## 2014-10-25 ENCOUNTER — Inpatient Hospital Stay (HOSPITAL_COMMUNITY)
Admission: EM | Admit: 2014-10-25 | Discharge: 2014-10-29 | DRG: 392 | Disposition: A | Discharge status: Home / Self Care | Payer: Medicaid Other | Attending: Family Medicine | Admitting: Family Medicine

## 2014-10-25 DIAGNOSIS — K229 Disease of esophagus, unspecified: Secondary | ICD-10-CM | POA: Insufficient documentation

## 2014-10-25 DIAGNOSIS — R112 Nausea with vomiting, unspecified: Secondary | ICD-10-CM | POA: Diagnosis present

## 2014-10-25 DIAGNOSIS — F419 Anxiety disorder, unspecified: Secondary | ICD-10-CM | POA: Diagnosis present

## 2014-10-25 DIAGNOSIS — Z833 Family history of diabetes mellitus: Secondary | ICD-10-CM | POA: Diagnosis not present

## 2014-10-25 DIAGNOSIS — E11319 Type 2 diabetes mellitus with unspecified diabetic retinopathy without macular edema: Secondary | ICD-10-CM

## 2014-10-25 DIAGNOSIS — N186 End stage renal disease: Secondary | ICD-10-CM

## 2014-10-25 DIAGNOSIS — R197 Diarrhea, unspecified: Secondary | ICD-10-CM

## 2014-10-25 DIAGNOSIS — K3189 Other diseases of stomach and duodenum: Secondary | ICD-10-CM | POA: Insufficient documentation

## 2014-10-25 DIAGNOSIS — K209 Esophagitis, unspecified without bleeding: Secondary | ICD-10-CM

## 2014-10-25 DIAGNOSIS — E118 Type 2 diabetes mellitus with unspecified complications: Secondary | ICD-10-CM

## 2014-10-25 DIAGNOSIS — E876 Hypokalemia: Secondary | ICD-10-CM | POA: Diagnosis present

## 2014-10-25 DIAGNOSIS — Z8041 Family history of malignant neoplasm of ovary: Secondary | ICD-10-CM

## 2014-10-25 DIAGNOSIS — Z794 Long term (current) use of insulin: Secondary | ICD-10-CM | POA: Diagnosis not present

## 2014-10-25 DIAGNOSIS — R030 Elevated blood-pressure reading, without diagnosis of hypertension: Secondary | ICD-10-CM | POA: Diagnosis present

## 2014-10-25 DIAGNOSIS — R1110 Vomiting, unspecified: Secondary | ICD-10-CM

## 2014-10-25 DIAGNOSIS — H409 Unspecified glaucoma: Secondary | ICD-10-CM | POA: Diagnosis present

## 2014-10-25 DIAGNOSIS — K2289 Other specified disease of esophagus: Secondary | ICD-10-CM | POA: Insufficient documentation

## 2014-10-25 DIAGNOSIS — K210 Gastro-esophageal reflux disease with esophagitis: Secondary | ICD-10-CM | POA: Diagnosis present

## 2014-10-25 DIAGNOSIS — E1165 Type 2 diabetes mellitus with hyperglycemia: Secondary | ICD-10-CM | POA: Diagnosis present

## 2014-10-25 DIAGNOSIS — E1122 Type 2 diabetes mellitus with diabetic chronic kidney disease: Secondary | ICD-10-CM

## 2014-10-25 DIAGNOSIS — R1013 Epigastric pain: Secondary | ICD-10-CM | POA: Insufficient documentation

## 2014-10-25 LAB — CBC WITH DIFFERENTIAL/PLATELET
Basophils Absolute: 0 10*3/uL (ref 0.0–0.1)
Basophils Relative: 0 % (ref 0–1)
Eosinophils Absolute: 0 10*3/uL (ref 0.0–0.7)
Eosinophils Relative: 0 % (ref 0–5)
HCT: 41.1 % (ref 39.0–52.0)
Hemoglobin: 15.2 g/dL (ref 13.0–17.0)
Lymphocytes Relative: 21 % (ref 12–46)
Lymphs Abs: 1.8 10*3/uL (ref 0.7–4.0)
MCH: 32 pg (ref 26.0–34.0)
MCHC: 37 g/dL — ABNORMAL HIGH (ref 30.0–36.0)
MCV: 86.5 fL (ref 78.0–100.0)
Monocytes Absolute: 0.5 10*3/uL (ref 0.1–1.0)
Monocytes Relative: 6 % (ref 3–12)
Neutro Abs: 6.4 10*3/uL (ref 1.7–7.7)
Neutrophils Relative %: 73 % (ref 43–77)
Platelets: 152 10*3/uL (ref 150–400)
RBC: 4.75 MIL/uL (ref 4.22–5.81)
RDW: 12.1 % (ref 11.5–15.5)
WBC: 8.8 10*3/uL (ref 4.0–10.5)

## 2014-10-25 LAB — COMPREHENSIVE METABOLIC PANEL
ALT: 14 U/L (ref 0–53)
AST: 20 U/L (ref 0–37)
Albumin: 3.6 g/dL (ref 3.5–5.2)
Alkaline Phosphatase: 47 U/L (ref 39–117)
Anion gap: 10 (ref 5–15)
BUN: 10 mg/dL (ref 6–23)
CO2: 24 mmol/L (ref 19–32)
Calcium: 8.8 mg/dL (ref 8.4–10.5)
Chloride: 104 mmol/L (ref 96–112)
Creatinine, Ser: 1.02 mg/dL (ref 0.50–1.35)
GFR calc Af Amer: >90 mL/min (ref >90)
GFR calc non Af Amer: 88 mL/min — ABNORMAL LOW (ref >90)
Glucose, Bld: 255 mg/dL — ABNORMAL HIGH (ref 70–99)
Potassium: 3.6 mmol/L (ref 3.5–5.1)
Sodium: 138 mmol/L (ref 135–145)
Total Bilirubin: 1.2 mg/dL (ref 0.3–1.2)
Total Protein: 6.9 g/dL (ref 6.0–8.3)

## 2014-10-25 LAB — TROPONIN I: Troponin I: <0.03 ng/mL (ref <0.031)

## 2014-10-25 LAB — LIPASE, BLOOD: Lipase: 18 U/L (ref 11–59)

## 2014-10-25 LAB — GLUCOSE, CAPILLARY
Glucose-Capillary: 237 mg/dL — ABNORMAL HIGH (ref 70–99)
Glucose-Capillary: 218 mg/dL — ABNORMAL HIGH (ref 70–99)

## 2014-10-25 MED ORDER — ONDANSETRON HCL 4 MG/2ML IJ SOLN
4.0000 mg | Freq: Once | INTRAMUSCULAR | Status: AC
  Administered 2014-10-25: 4 mg via INTRAVENOUS
  Filled 2014-10-25: qty 2

## 2014-10-25 MED ORDER — INSULIN ASPART 100 UNIT/ML ~~LOC~~ SOLN
0.0000 [IU] | Freq: Three times a day (TID) | SUBCUTANEOUS | Status: DC
  Administered 2014-10-25: 5 [IU] via SUBCUTANEOUS
  Administered 2014-10-26 (×3): 3 [IU] via SUBCUTANEOUS
  Administered 2014-10-27: 2 [IU] via SUBCUTANEOUS
  Administered 2014-10-27 (×2): 5 [IU] via SUBCUTANEOUS
  Administered 2014-10-28 (×2): 3 [IU] via SUBCUTANEOUS
  Administered 2014-10-28 – 2014-10-29 (×3): 5 [IU] via SUBCUTANEOUS

## 2014-10-25 MED ORDER — PROMETHAZINE HCL 25 MG/ML IJ SOLN
25.0000 mg | Freq: Once | INTRAMUSCULAR | Status: AC
  Administered 2014-10-25: 25 mg via INTRAVENOUS
  Filled 2014-10-25: qty 1

## 2014-10-25 MED ORDER — HEPARIN SODIUM (PORCINE) 5000 UNIT/ML IJ SOLN
5000.0000 [IU] | Freq: Three times a day (TID) | INTRAMUSCULAR | Status: DC
  Administered 2014-10-25 – 2014-10-26 (×2): 5000 [IU] via SUBCUTANEOUS
  Filled 2014-10-25 (×2): qty 1

## 2014-10-25 MED ORDER — SODIUM CHLORIDE 0.9 % IV BOLUS (SEPSIS)
1000.0000 mL | Freq: Once | INTRAVENOUS | Status: AC
Start: 2014-10-25 — End: 2014-10-25
  Administered 2014-10-25: 1000 mL via INTRAVENOUS

## 2014-10-25 MED ORDER — SODIUM CHLORIDE 0.9 % IV SOLN
INTRAVENOUS | Status: AC
  Administered 2014-10-25: 16:00:00 via INTRAVENOUS

## 2014-10-25 MED ORDER — INSULIN ASPART 100 UNIT/ML ~~LOC~~ SOLN
0.0000 [IU] | Freq: Every day | SUBCUTANEOUS | Status: DC
  Administered 2014-10-25: 2 [IU] via SUBCUTANEOUS

## 2014-10-25 MED ORDER — MORPHINE SULFATE 2 MG/ML IJ SOLN
1.0000 mg | INTRAMUSCULAR | Status: DC | PRN
  Administered 2014-10-25 – 2014-10-27 (×5): 1 mg via INTRAVENOUS
  Filled 2014-10-25 (×5): qty 1

## 2014-10-25 MED ORDER — METOCLOPRAMIDE HCL 5 MG/ML IJ SOLN
10.0000 mg | Freq: Once | INTRAMUSCULAR | Status: AC
  Administered 2014-10-25: 10 mg via INTRAVENOUS
  Filled 2014-10-25: qty 2

## 2014-10-25 MED ORDER — SODIUM CHLORIDE 0.9 % IV SOLN
INTRAVENOUS | Status: DC
  Administered 2014-10-25 – 2014-10-27 (×4): via INTRAVENOUS

## 2014-10-25 MED ORDER — FENTANYL CITRATE 0.05 MG/ML IJ SOLN
50.0000 ug | Freq: Once | INTRAMUSCULAR | Status: AC
  Administered 2014-10-25: 50 ug via INTRAVENOUS
  Filled 2014-10-25: qty 2

## 2014-10-25 MED ORDER — METOCLOPRAMIDE HCL 5 MG/ML IJ SOLN
10.0000 mg | Freq: Four times a day (QID) | INTRAMUSCULAR | Status: DC
  Administered 2014-10-25 – 2014-10-27 (×8): 10 mg via INTRAVENOUS
  Filled 2014-10-25 (×8): qty 2

## 2014-10-25 MED ORDER — HYDRALAZINE HCL 20 MG/ML IJ SOLN
5.0000 mg | INTRAMUSCULAR | Status: DC | PRN
  Administered 2014-10-25 – 2014-10-27 (×5): 5 mg via INTRAVENOUS
  Filled 2014-10-25 (×5): qty 1

## 2014-10-25 MED ORDER — ONDANSETRON HCL 4 MG/2ML IJ SOLN
4.0000 mg | Freq: Four times a day (QID) | INTRAMUSCULAR | Status: DC | PRN
  Administered 2014-10-25 – 2014-10-27 (×5): 4 mg via INTRAVENOUS
  Filled 2014-10-25 (×5): qty 2

## 2014-10-25 MED ORDER — GI COCKTAIL ~~LOC~~
30.0000 mL | Freq: Once | ORAL | Status: AC
  Administered 2014-10-25: 30 mL via ORAL
  Filled 2014-10-25: qty 30

## 2014-10-25 NOTE — ED Notes (Signed)
Pt vomited up water.  Pt moaning and grunting, stating that his stomach hurts.

## 2014-10-25 NOTE — H&P (Signed)
[facility] History and Physical  Timothy Clark [id number] DOB: [birth year] DOA: 10/25/2014  Referring physician: ER PCP: Jeri Modena   Chief Complaint: Intractable vomiting.  HPI: Timothy Clark is a 44 y.o. male  This is a 44 year old man, diabetic, who presents with a 2 to three-day history of intermittent episodes of vomiting. Unfortunately, this is continued and does not seem to be stopping. He has had associated epigastric abdominal pain with this vomiting. There is no history of hematemesis. There is no history of melena. There is no history of pain radiating to the back. There is no history of fever. 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 Evaluation in the emergency room shows that he is not in DKA but because of his continued vomiting, he is being admitted for further management.   Review of Systems:  Apart from symptoms above, all systems negative.   Past Medical History  Diagnosis Date  . Diabetes mellitus without complication   . Glaucoma   . Eye hemorrhage    Past Surgical History  Procedure Laterality Date  . Eye surgery     Social History:  reports that he has never smoked. He does not have any smokeless tobacco history on file. He reports that he does not drink alcohol or use illicit drugs.  Allergies  Allergen Reactions  . Aspirin   . Doxycycline   . Penicillins      Family history: He has several siblings who have diabetes.    Prior to Admission medications   Medication Sig Start Date End Date Taking? Authorizing Provider  cyclobenzaprine (FLEXERIL) 10 MG tablet Take 10 mg by mouth at bedtime as needed for muscle spasms.   Yes Historical Provider, MD  Dapagliflozin Propanediol (FARXIGA) 10 MG TABS Take 10 mg by mouth daily.   Yes Historical Provider, MD  gabapentin (NEURONTIN) 600 MG tablet Take 600 mg by mouth 2 (two) times daily.   Yes Historical Provider, MD  Insulin Isophane & Regular  Human (HUMULIN 70/30 KWIKPEN) (70-30) 100 UNIT/ML PEN Inject 35 Units into the skin 3 (three) times daily.    Yes Historical Provider, MD  ondansetron (ZOFRAN) 4 MG tablet Take 4 mg by mouth every 8 (eight) hours as needed for nausea or vomiting.   Yes Historical Provider, MD  pantoprazole (PROTONIX) 40 MG tablet Take 40 mg by mouth daily.   Yes Historical Provider, MD  promethazine (PHENERGAN) 25 MG tablet Take 1 tablet (25 mg total) by mouth every 6 (six) hours as needed. Patient not taking: Reported on 10/25/2014 03/20/14   Fredia Sorrow, MD   Physical Exam: Filed Vitals:   10/25/14 1123 10/25/14 1130 10/25/14 1200 10/25/14 1230  BP: 181/100 178/97 179/103 196/111  Pulse: 104 93 93 94  Temp:      TempSrc:      Resp:      Height:      Weight:      SpO2: 100%  100% 100%    Wt Readings from Last 3 Encounters:  10/25/14 108.863 kg (240 lb)  02/14/10 107.675 kg (237 lb 6.1 oz)  01/14/10 107.956 kg (238 lb)    General:  Appears uncomfortable with heaving during my visit. He is not toxic or septic clinically. He is hemodynamically stable. In fact, his blood pressure is elevated. Eyes: PERRL, normal lids, irises & conjunctiva ENT: grossly normal hearing, lips & tongue Neck: no LAD, masses or thyromegaly Cardiovascular: RRR, no m/r/g. No LE edema. Telemetry: SR,  no arrhythmias  Respiratory: CTA bilaterally, no w/r/r. Normal respiratory effort. Abdomen: mildly tender in the epigastric area. Bowel sounds are heard. He does not clinically appear to have an acute abdomen.  Skin: no rash or induration seen on limited exam Musculoskeletal: grossly normal tone BUE/BLE Psychiatric: grossly normal mood and affect, speech fluent and appropriate Neurologic: grossly non-focal.          Labs on Admission:  Basic Metabolic Panel:  Recent Labs Lab 10/25/14 0823  NA 138  K 3.6  CL 104  CO2 24  GLUCOSE 255*  BUN 10  CREATININE 1.02  CALCIUM 8.8   Liver Function Tests:  Recent  Labs Lab 10/25/14 0823  AST 20  ALT 14  ALKPHOS 47  BILITOT 1.2  PROT 6.9  ALBUMIN 3.6    Recent Labs Lab 10/25/14 0823  LIPASE 18   No results for input(s): AMMONIA in the last 168 hours. CBC:  Recent Labs Lab 10/25/14 0823  WBC 8.8  NEUTROABS 6.4  HGB 15.2  HCT 41.1  MCV 86.5  PLT 152   Cardiac Enzymes:  Recent Labs Lab 10/25/14 0823  TROPONINI <0.03    BNP (last 3 results) No results for input(s): BNP in the last 8760 hours.  ProBNP (last 3 results) No results for input(s): PROBNP in the last 8760 hours.  CBG: No results for input(s): GLUCAP in the last 168 hours.  Radiological Exams on Admission: US Abdomen Complete  10/25/2014   CLINICAL DATA:  Abdominal pain.  Emesis for 3 days.  EXAM: ULTRASOUND ABDOMEN COMPLETE  COMPARISON:  Abdominal ultrasound 10/24/2014 at [facility] [place]: Gallbladder: Sludge is present in the gallbladder. There is no wall thickening or sonographic Murphy sign.  Common bile duct: Diameter: 2.5 mm, within normal limits. The focal dilation on yesterday's exam is not visible.  Liver: The liver is somewhat echogenic throughout. No focal lesions are present.  IVC: Not visualized due to overlying bowel gas.  Pancreas: Not visualized due to overlying bowel gas.  Spleen: The spleen is of normal size and echotexture, measuring 6.9 cm maximally.  Right Kidney: Length: 13.7 cm, within normal limits. Echogenicity within normal limits. No mass or hydronephrosis visualized.  Left Kidney: Length: 13.4 cm, within normal limits. Echogenicity within normal limits. No mass or hydronephrosis visualized.  Abdominal aorta: No aneurysm visualized.  Other findings: None.  IMPRESSION: 1. No acute or focal lesion to explain the patient's symptoms. 2. Stable appearance of sludge in the gallbladder without evidence for cholecystitis. 3. Mild dilation of the common bile duct noticed yesterday is no longer present. 4. Increased echogenicity  throughout the liver is again seen, suggesting hepatic steatosis.   Electronically Signed   By: San Morelle M.D.   On: 10/25/2014 12:22      Assessment/Plan   1. Intractable nausea and vomiting. The etiology is unclear. I wonder if he has gastroparesis. We will give him intravenous metoclopramide on a scheduled basis as well as intravenous fluids. We will try him on clear fluids if he can tolerate them. We will ask gastroenterology to see this patient for further recommendations. 2. Elevated blood pressure. He has no history of hypertension and his family members tell me that his blood pressures been elevated in the last couple of days. He has no headache. We will give him when necessary intravenous hydralazine for the time being but if he is truly hypertensive, he will need long-term antihypertensive medications. 3.  Diabetes. He is not in DKA.  We will at this time use only a sliding scale of insulin to control his diabetes.   Further recommendations will depend on patient's hospital progress.  Code Status: Full code.   DVT Prophylaxis:heparin.   Family Communication: I discussed the plan with the patient at the bedside.   Disposition Plan: Home when medically stable.    Time spent: 60 minutes.   Doree Albee [facility] Pager [phone].

## 2014-10-25 NOTE — ED Notes (Addendum)
Pt reports upper abdominal pain that radiates into chest,n/v x3 days. Pt reports was seen for same at [facility]. Pt reports history of GERD and reports," all I need is a GI cocktail." pt also reports CBG at home have been reading high. Moderate anxiety noted.

## 2014-10-25 NOTE — Progress Notes (Signed)
[place] with Dr.Hernandez regarding patient's nausea and vomiting. New order given for Zofran 4mg  IV Q6H PRN for n/v. Patient aware.

## 2014-10-25 NOTE — ED Provider Notes (Signed)
CSN: [id number]     Arrival date & time 10/25/14  0740 History  This chart was scribed for Davonna Belling, MD by Einar Pheasant, Medical Scribe. This patient was seen in room APA18/APA18 and the patient's care was started at 3:41 PM.  LEVEL 5 CAVEAT- pt not responding to questions   Chief Complaint  Patient presents with  . Abdominal Pain   The history is provided by medical records and a relative. The history is limited by the condition of the patient. No language interpreter was used.   HPI Comments: Timothy Clark is a 44 y.o. male with PMhx of DM presents to the Emergency Department complaining of sudden onset abdominal pain that started this AM. He endorses several intermittent episodes of emesis that started 3 days ago.    Past Medical History  Diagnosis Date  . Diabetes mellitus without complication   . Glaucoma   . Eye hemorrhage    Past Surgical History  Procedure Laterality Date  . Eye surgery     History reviewed. No pertinent family history. History  Substance Use Topics  . Smoking status: Never Smoker   . Smokeless tobacco: Not on file  . Alcohol Use: No    Review of Systems  Unable to perform ROS: Patient nonverbal   Allergies  Aspirin; Doxycycline; and Penicillins  Home Medications   Prior to Admission medications   Medication Sig Start Date End Date Taking? Authorizing Provider  cyclobenzaprine (FLEXERIL) 10 MG tablet Take 10 mg by mouth at bedtime as needed for muscle spasms.   Yes Historical Provider, MD  Dapagliflozin Propanediol (FARXIGA) 10 MG TABS Take 10 mg by mouth daily.   Yes Historical Provider, MD  gabapentin (NEURONTIN) 600 MG tablet Take 600 mg by mouth 2 (two) times daily.   Yes Historical Provider, MD  Insulin Isophane & Regular Human (HUMULIN 70/30 KWIKPEN) (70-30) 100 UNIT/ML PEN Inject 35 Units into the skin 3 (three) times daily.    Yes Historical Provider, MD  ondansetron (ZOFRAN) 4 MG tablet Take 4 mg by mouth every 8 (eight) hours  as needed for nausea or vomiting.   Yes Historical Provider, MD  pantoprazole (PROTONIX) 40 MG tablet Take 40 mg by mouth daily.   Yes Historical Provider, MD  promethazine (PHENERGAN) 25 MG tablet Take 1 tablet (25 mg total) by mouth every 6 (six) hours as needed. Patient not taking: Reported on 10/25/2014 03/20/14   Fredia Sorrow, MD   BP 165/107 mmHg  Pulse 92  Temp(Src) 98.1 F (36.7 C) (Oral)  Resp 16  Ht 6' (1.829 m)  Wt 240 lb (108.863 kg)  BMI 32.54 kg/m2  SpO2 100%   Physical Exam  Constitutional: He appears well-developed and well-nourished. No distress.  Pt is difficult to examine secondary to his persistent loud groaning.   HENT:  Head: Normocephalic and atraumatic.  Eyes: Conjunctivae are normal. Right eye exhibits no discharge. Left eye exhibits no discharge.  Neck: Neck supple.  Cardiovascular: Normal rate, regular rhythm and normal heart sounds.  Exam reveals no gallop and no friction rub.   No murmur heard. Pulmonary/Chest: Effort normal and breath sounds normal. No respiratory distress.  Lungs are clear  Abdominal: Soft. He exhibits no distension. There is no tenderness.  Musculoskeletal: He exhibits no edema or tenderness.  Neurological: He is alert.  Skin: Skin is warm and dry.  Psychiatric: He has a normal mood and affect. His behavior is normal. Thought content normal.  Nursing note and vitals reviewed.  ED Course  Procedures (including critical care time)  COORDINATION OF CARE: 8:10 AM- Pt's family advised of plan of treatment and they agree.  Labs Review Labs Reviewed  CBC WITH DIFFERENTIAL/PLATELET - Abnormal; Notable for the following:    MCHC 37.0 (*)    All other components within normal limits  COMPREHENSIVE METABOLIC PANEL - Abnormal; Notable for the following:    Glucose, Bld 255 (*)    GFR calc non Af Amer 88 (*)    All other components within normal limits  LIPASE, BLOOD  TROPONIN I    Imaging Review US Abdomen  Complete  10/25/2014   CLINICAL DATA:  Abdominal pain.  Emesis for 3 days.  EXAM: ULTRASOUND ABDOMEN COMPLETE  COMPARISON:  Abdominal ultrasound 10/24/2014 at [facility] [place]: Gallbladder: Sludge is present in the gallbladder. There is no wall thickening or sonographic Murphy sign.  Common bile duct: Diameter: 2.5 mm, within normal limits. The focal dilation on yesterday's exam is not visible.  Liver: The liver is somewhat echogenic throughout. No focal lesions are present.  IVC: Not visualized due to overlying bowel gas.  Pancreas: Not visualized due to overlying bowel gas.  Spleen: The spleen is of normal size and echotexture, measuring 6.9 cm maximally.  Right Kidney: Length: 13.7 cm, within normal limits. Echogenicity within normal limits. No mass or hydronephrosis visualized.  Left Kidney: Length: 13.4 cm, within normal limits. Echogenicity within normal limits. No mass or hydronephrosis visualized.  Abdominal aorta: No aneurysm visualized.  Other findings: None.  IMPRESSION: 1. No acute or focal lesion to explain the patient's symptoms. 2. Stable appearance of sludge in the gallbladder without evidence for cholecystitis. 3. Mild dilation of the common bile duct noticed yesterday is no longer present. 4. Increased echogenicity throughout the liver is again seen, suggesting hepatic steatosis.   Electronically Signed   By: San Morelle M.D.   On: 10/25/2014 12:22     EKG Interpretation   Date/Time:  Wednesday October 25 2014 07:52:11 EDT Ventricular Rate:  94 PR Interval:  151 QRS Duration: 69 QT Interval:  448 QTC Calculation: 560 R Axis:   31 Text Interpretation:  Sinus rhythm Ventricular premature complex Low  voltage, precordial leads Anteroseptal infarct, old Nonspecific T  abnormalities, lateral leads Prolonged QT interval Confirmed by Alvino Chapel   MD, Sharlee Rufino [phone]) on 10/25/2014 8:08:58 AM      MDM   Final diagnoses:  Nausea vomiting and diarrhea     Patient with nausea vomiting diarrhea. Has had 2 visits in the last couple days to [place]. Later informed that he had an ultrasound at that time also. Continues to vomit in the ER. Somewhat poor historian. Has had gastritis in the past. Will admit to internal medicine. Diabetic gastroparesis considered with his history of diabetes.  I personally performed the services described in this documentation, which was scribed in my presence. The recorded information has been reviewed and is accurate.     Davonna Belling, MD 10/25/14 [phone]

## 2014-10-26 ENCOUNTER — Encounter (HOSPITAL_COMMUNITY): Payer: Self-pay | Admitting: Gastroenterology

## 2014-10-26 ENCOUNTER — Encounter (HOSPITAL_COMMUNITY)
Admission: EM | Disposition: A | Discharge status: Home / Self Care | Payer: Self-pay | Source: Home / Self Care | Attending: Family Medicine

## 2014-10-26 DIAGNOSIS — G43A1 Cyclical vomiting, intractable: Secondary | ICD-10-CM

## 2014-10-26 DIAGNOSIS — K229 Disease of esophagus, unspecified: Secondary | ICD-10-CM

## 2014-10-26 DIAGNOSIS — K3189 Other diseases of stomach and duodenum: Secondary | ICD-10-CM

## 2014-10-26 DIAGNOSIS — K2289 Other specified disease of esophagus: Secondary | ICD-10-CM | POA: Insufficient documentation

## 2014-10-26 DIAGNOSIS — R1013 Epigastric pain: Secondary | ICD-10-CM

## 2014-10-26 HISTORY — PX: ESOPHAGOGASTRODUODENOSCOPY: SHX5428

## 2014-10-26 LAB — GLUCOSE, CAPILLARY
Glucose-Capillary: 197 mg/dL — ABNORMAL HIGH (ref 70–99)
Glucose-Capillary: 185 mg/dL — ABNORMAL HIGH (ref 70–99)
Glucose-Capillary: 184 mg/dL — ABNORMAL HIGH (ref 70–99)
Glucose-Capillary: 197 mg/dL — ABNORMAL HIGH (ref 70–99)

## 2014-10-26 LAB — CBC
HCT: 36.5 % — ABNORMAL LOW (ref 39.0–52.0)
Hemoglobin: 13.2 g/dL (ref 13.0–17.0)
MCH: 31.8 pg (ref 26.0–34.0)
MCHC: 36.2 g/dL — ABNORMAL HIGH (ref 30.0–36.0)
MCV: 88 fL (ref 78.0–100.0)
Platelets: 143 10*3/uL — ABNORMAL LOW (ref 150–400)
RBC: 4.15 MIL/uL — ABNORMAL LOW (ref 4.22–5.81)
RDW: 12.5 % (ref 11.5–15.5)
WBC: 9.4 10*3/uL (ref 4.0–10.5)

## 2014-10-26 LAB — COMPREHENSIVE METABOLIC PANEL
ALT: 12 U/L (ref 0–53)
AST: 13 U/L (ref 0–37)
Albumin: 3 g/dL — ABNORMAL LOW (ref 3.5–5.2)
Alkaline Phosphatase: 38 U/L — ABNORMAL LOW (ref 39–117)
Anion gap: 7 (ref 5–15)
BUN: 11 mg/dL (ref 6–23)
CO2: 22 mmol/L (ref 19–32)
Calcium: 7.8 mg/dL — ABNORMAL LOW (ref 8.4–10.5)
Chloride: 106 mmol/L (ref 96–112)
Creatinine, Ser: 0.96 mg/dL (ref 0.50–1.35)
GFR calc Af Amer: >90 mL/min (ref >90)
GFR calc non Af Amer: >90 mL/min (ref >90)
Glucose, Bld: 182 mg/dL — ABNORMAL HIGH (ref 70–99)
Potassium: 3 mmol/L — ABNORMAL LOW (ref 3.5–5.1)
Sodium: 135 mmol/L (ref 135–145)
Total Bilirubin: 1.2 mg/dL (ref 0.3–1.2)
Total Protein: 5.8 g/dL — ABNORMAL LOW (ref 6.0–8.3)

## 2014-10-26 LAB — MAGNESIUM: Magnesium: 1.9 mg/dL (ref 1.5–2.5)

## 2014-10-26 SURGERY — EGD (ESOPHAGOGASTRODUODENOSCOPY)
Anesthesia: Moderate Sedation

## 2014-10-26 MED ORDER — LIDOCAINE VISCOUS 2 % MT SOLN
OROMUCOSAL | Status: AC
  Filled 2014-10-26: qty 15

## 2014-10-26 MED ORDER — MIDAZOLAM HCL 5 MG/5ML IJ SOLN
INTRAMUSCULAR | Status: DC | PRN
  Administered 2014-10-26 (×2): 1 mg via INTRAVENOUS

## 2014-10-26 MED ORDER — MEPERIDINE HCL 100 MG/ML IJ SOLN
INTRAMUSCULAR | Status: DC | PRN
  Administered 2014-10-26: 25 mg via INTRAVENOUS

## 2014-10-26 MED ORDER — PANTOPRAZOLE SODIUM 40 MG IV SOLR
40.0000 mg | Freq: Two times a day (BID) | INTRAVENOUS | Status: DC
  Administered 2014-10-26 – 2014-10-27 (×3): 40 mg via INTRAVENOUS
  Filled 2014-10-26 (×3): qty 40

## 2014-10-26 MED ORDER — POTASSIUM CHLORIDE 10 MEQ/100ML IV SOLN
10.0000 meq | INTRAVENOUS | Status: AC
  Administered 2014-10-26 (×4): 10 meq via INTRAVENOUS
  Filled 2014-10-26 (×3): qty 100

## 2014-10-26 MED ORDER — SUCRALFATE 1 GM/10ML PO SUSP: 1.0000 g | Freq: Three times a day (TID) | ORAL | Status: DC

## 2014-10-26 MED ORDER — STERILE WATER FOR IRRIGATION IR SOLN
Status: DC | PRN
  Administered 2014-10-26: 16:00:00

## 2014-10-26 MED ORDER — MEPERIDINE HCL 100 MG/ML IJ SOLN
INTRAMUSCULAR | Status: AC
  Filled 2014-10-26: qty 2

## 2014-10-26 MED ORDER — PROMETHAZINE HCL 25 MG/ML IJ SOLN
25.0000 mg | Freq: Once | INTRAMUSCULAR | Status: AC
  Administered 2014-10-26: 25 mg via INTRAVENOUS
  Filled 2014-10-26: qty 1

## 2014-10-26 MED ORDER — ONDANSETRON HCL 4 MG/2ML IJ SOLN
INTRAMUSCULAR | Status: AC
  Filled 2014-10-26: qty 2

## 2014-10-26 MED ORDER — POTASSIUM CHLORIDE CRYS ER 20 MEQ PO TBCR
40.0000 meq | EXTENDED_RELEASE_TABLET | ORAL | Status: AC
  Administered 2014-10-26: 40 meq via ORAL
  Filled 2014-10-26: qty 2

## 2014-10-26 MED ORDER — MIDAZOLAM HCL 5 MG/5ML IJ SOLN
INTRAMUSCULAR | Status: AC
  Filled 2014-10-26: qty 10

## 2014-10-26 MED ORDER — LIDOCAINE VISCOUS 2 % MT SOLN
OROMUCOSAL | Status: DC | PRN
  Administered 2014-10-26: 4 mL via OROMUCOSAL

## 2014-10-26 MED ORDER — SODIUM CHLORIDE 0.9 % IV SOLN: INTRAVENOUS | Status: DC

## 2014-10-26 MED ORDER — ONDANSETRON HCL 4 MG/2ML IJ SOLN
4.0000 mg | Freq: Once | INTRAMUSCULAR | Status: AC
  Administered 2014-10-26: 4 mg via INTRAVENOUS

## 2014-10-26 MED ORDER — POTASSIUM CHLORIDE 10 MEQ/100ML IV SOLN
INTRAVENOUS | Status: AC
  Filled 2014-10-26: qty 200

## 2014-10-26 MED ORDER — SUCRALFATE 1 GM/10ML PO SUSP
1.0000 g | Freq: Three times a day (TID) | ORAL | Status: DC
  Administered 2014-10-26 – 2014-10-27 (×4): 1 g via ORAL
  Filled 2014-10-26 (×5): qty 10

## 2014-10-26 NOTE — Progress Notes (Signed)
Inpatient Diabetes Program Recommendations  AACE/ADA: New Consensus Statement on Inpatient Glycemic Control (2013)  Target Ranges:  Prepandial:   less than 140 mg/dL      Peak postprandial:   less than 180 mg/dL (1-2 hours)      Critically ill patients:  140 - 180 mg/dL   Results for HARPER, KISE (MRN [id number]) as of 10/26/2014 09:23  Ref. Range 10/25/2014 17:02 10/25/2014 21:57 10/26/2014 08:00  Glucose-Capillary Latest Range: 70-99 mg/dL 237 (H) 218 (H) 197 (H)    Reason for Visit: ? Gastroparesis  Diabetes history: DM 2 Outpatient Diabetes medications: Humalin 70/30 35 units TID, Farxiga 10 mg Daily Current orders for Inpatient glycemic control: Novolog 0-15 units TID, Novolog 0-5 units QHS  Inpatient Diabetes Program Recommendations  Insulin - Basal: Patient takes Humalin 70/30 35units TID at home. Due to home dose of insulin anticipate glucose to increase throughout the day. Please consider ordering low dose basal, Lantus 10-15 units Q24 hrs.  Thanks,  Tama Headings RN, MSN, [facility] Inpatient Diabetes Coordinator Team Pager [phone]

## 2014-10-26 NOTE — Care Management Note (Addendum)
    Page 1 of 1   10/27/2014     1:16:13 PM CARE MANAGEMENT NOTE 10/27/2014  Patient:  Timothy Clark, Timothy Clark   Account Number:  [id number]  Date Initiated:  10/26/2014  Documentation initiated by:  Theophilus Kinds  Subjective/Objective Assessment:   Pt admitted from home with intractable nausea and vomiting. Pt lives with family and willl return home at [employer]. Pt is independent with ADL's.     Action/Plan:   No CM needs noted.   Anticipated DC Date:  10/28/2014   Anticipated DC Plan:  [place]  CM consult      Choice offered to / List presented to:             Status of service:  Completed, signed off Medicare Important Message given?   (If response is "NO", the following Medicare IM given date fields will be blank) Date Medicare IM given:   Medicare IM given by:   Date Additional Medicare IM given:   Additional Medicare IM given by:    Discharge Disposition:  HOME/SELF CARE  Per UR Regulation:    If discussed at Long Length of Stay Meetings, dates discussed:    Comments:  10/27/14 [place], RN BSN CM Anticipate discharge within 48 hours. No Cm needs noted.  10/26/14 [place], RN BSN CM

## 2014-10-26 NOTE — Progress Notes (Signed)
UR chart review completed.  

## 2014-10-26 NOTE — Op Note (Signed)
[facility] [address] [place], [address]   ENDOSCOPY PROCEDURE REPORT  PATIENT: Taishi, Slusser  MR#: [id number] BIRTHDATE: [birth year] , 34  yrs. old GENDER: male ENDOSCOPIST: R.  Garfield Cornea, MD [facility] REFERRED BY:  Tawni Carnes, PA PROCEDURE DATE:  2014-11-08 PROCEDURE:  EGD w/ biopsy INDICATIONS:  Nausea and vomiting; epigastric pain; diabetes mellitus with hemoglobin A1c recently in the 9 range per patient. MEDICATIONS: Versed 1 mg IV and Demerol 25 mg IV.  Phenergan 25 mg IV.  Xylocaine gel orally ASA CLASS:      Class III  CONSENT: The risks, benefits, limitations, alternatives and imponderables have been discussed.  The potential for biopsy, esophogeal dilation, etc. have also been reviewed.  Questions have been answered.  All parties agreeable.  Please see the history and physical in the medical record for more information.  DESCRIPTION OF PROCEDURE: After the risks benefits and alternatives of the procedure were thoroughly explained, informed consent was obtained.  The [id number] endoscope was introduced through the mouth and advanced to the second portion of the duodenum , limited by Without limitations. The instrument was slowly withdrawn as the mucosa was fully examined.    Four-quadrant distal linear esophageal mucosal excoriations within 3 cm the EG junction.  No plaques seen.  No Barrett's esophagus. Stomach empty diffuse submucosal petechiae present.  In the cardia there was a focal area of excoriation consistent with trauma: no ulcer or infiltrating process.  Retroflexed views revealed as previously described.   Pylorus patent. Normal-appearing first and second portion of the duodenum (ampulla appeared normal as well) biopsies the abnormal stomach and distal esophagus taken for histologic study.  The scope was then withdrawn from the patient and the procedure completed.  COMPLICATIONS: There were no immediate  complications.  ENDOSCOPIC IMPRESSION: Distal esophagitis   -  likely reflux related although an element of pill-induced injury not excluded?"status post biopsy. Diffusely abnormal gastric mucosa of uncertain significance  - status post gastric biopsy. Focal area of excoriation in the cardia most consistent with a trauma of heaving.  Reviewed recent right upper quadrant ultrasound with radiologist. They are both felt to be normal studies.  RECOMMENDATIONS: Clear liquid diet. Continue Protonix. Hopefully,can taper off Reglan in the near future. Carafate suspension 4 times a day. Follow-up on pathology. Strive for better glycemic control.    Follow-up on pathology.   Consider trial of low-dose Thorazine if if hiccups  do not resolve.  REPEAT EXAM:  eSigned:  R. Garfield Cornea, MD Rosalita Chessman [facility] 11/08/2014 4:22 PM    CC:  CPT CODES: ICD CODES:  The ICD and CPT codes recommended by this software are interpretations from the data that the clinical staff has captured with the software.  The verification of the translation of this report to the ICD and CPT codes and modifiers is the sole responsibility of the health care institution and practicing physician where this report was generated.  [employer] will not be held responsible for the validity of the ICD and CPT codes included on this report.  AMA assumes no liability for data contained or not contained herein. CPT is a Designer, television/film set of the [employer].  PATIENT NAME:  [place], Kall MR#: [id number]

## 2014-10-26 NOTE — Consult Note (Signed)
Referring Provider: Mikki Harbor* Primary Care Physician:  Jeri Modena Primary Gastroenterologist:  Garfield Cornea, MD   Reason for Consultation:  Intractable N/V, epigastric pain  HPI: Timothy Clark is a 43 y.o. male admitted with four day h/o acute onset N/V associated with epigastric pain. Has been seen in ED twice at [facility] and admitted here yesterday. Similar symptoms last year requiring hospitalization at [facility].  He complains of refractory vomiting, but no hematemesis. Epigastric pain. BM normal. No melena, brbpr. Has be unable to keep anything down.   Abdominal u/s done 10/24/14 at [facility] showed CBD of 6-27mm with small echogenic focus in region of mid-CBD cannot exclude small CBD stone. Head CT 10/24/14: NAD.  Abdominal u/s here yesterday showed normal CBD of 2.58mm with previous dilation resolved. No evidence of gallstones.   CT A/P with contrast in 02/2014 at [facility] with no significant findings.   Denies NSAIDs/ASA use. At baseline has heartburn and takes ?Prevacid. Has had a lot of dental issues/infections. Thinks his nausea is related to that. Sister states symptoms preceded dental issues. Patient unable to provide extensive history due to active heaving. Significant epigastric pain during visit. Would not sit still due to pain.  Patient is unsure if he has ever had a colonoscopy.   Prior to Admission medications   Medication Sig Start Date End Date Taking? Authorizing Provider  cyclobenzaprine (FLEXERIL) 10 MG tablet Take 10 mg by mouth at bedtime as needed for muscle spasms.   Yes Historical Provider, MD  Dapagliflozin Propanediol (FARXIGA) 10 MG TABS Take 10 mg by mouth daily.   Yes Historical Provider, MD  gabapentin (NEURONTIN) 600 MG tablet Take 600 mg by mouth 2 (two) times daily.   Yes Historical Provider, MD  Insulin Isophane & Regular Human (HUMULIN 70/30 KWIKPEN) (70-30) 100 UNIT/ML PEN Inject 35 Units into the skin 3 (three) times daily.    Yes Historical  Provider, MD  ondansetron (ZOFRAN) 4 MG tablet Take 4 mg by mouth every 8 (eight) hours as needed for nausea or vomiting.   Yes Historical Provider, MD  pantoprazole (PROTONIX) 40 MG tablet Take 40 mg by mouth daily.   Yes Historical Provider, MD  promethazine (PHENERGAN) 25 MG tablet Take 1 tablet (25 mg total) by mouth every 6 (six) hours as needed. Patient not taking: Reported on 10/25/2014 03/20/14   Fredia Sorrow, MD    Current Facility-Administered Medications  Medication Dose Route Frequency Provider Last Rate Last Dose  . 0.9 %  sodium chloride infusion   Intravenous Continuous Nimish C Gosrani, MD 100 mL/hr at 10/26/14 0628    . heparin injection 5,000 Units  5,000 Units Subcutaneous 3 times per day Doree Albee, MD   5,000 Units at 10/26/14 0516  . hydrALAZINE (APRESOLINE) injection 5 mg  5 mg Intravenous Q4H PRN Doree Albee, MD   5 mg at 10/25/14 2231  . insulin aspart (novoLOG) injection 0-15 Units  0-15 Units Subcutaneous TID WC Nimish Luther Parody, MD   5 Units at 10/25/14 1721  . insulin aspart (novoLOG) injection 0-5 Units  0-5 Units Subcutaneous QHS Doree Albee, MD   2 Units at 10/25/14 2231  . metoCLOPramide (REGLAN) injection 10 mg  10 mg Intravenous 4 times per day Doree Albee, MD   10 mg at 10/26/14 0516  . morphine 2 MG/ML injection 1 mg  1 mg Intravenous Q4H PRN Erline Hau, MD   1 mg at 10/26/14 0516  . ondansetron (ZOFRAN)  injection 4 mg  4 mg Intravenous Q6H PRN Erline Hau, MD   4 mg at 10/25/14 1832    Allergies as of 10/25/2014 - Review Complete 10/25/2014  Allergen Reaction Noted  . Aspirin  03/22/2009  . Doxycycline  03/20/2014  . Penicillins  03/22/2009    Past Medical History  Diagnosis Date  . Diabetes mellitus without complication     diagnosed around age 53  . Glaucoma   . Eye hemorrhage     Past Surgical History  Procedure Laterality Date  . Eye surgery    . Esophagogastroduodenoscopy  2015    Dr.  Britta Mccreedy    Family History  Problem Relation Age of Onset  . Ovarian cancer Mother   . Cervical cancer Sister   . Diabetes Sister   . Colon cancer Neg Hx     History   Social History  . Marital Status: Single    Spouse Name: N/A  . Number of Children: N/A  . Years of Education: N/A   Occupational History  . Not on file.   Social History Main Topics  . Smoking status: Never Smoker   . Smokeless tobacco: Not on file  . Alcohol Use: No  . Drug Use: No  . Sexual Activity: Not on file   Other Topics Concern  . Not on file   Social History Narrative     ROS:  General: Negative for fever, chills, fatigue, weakness. Eyes: Negative for vision changes.  ENT: Negative for hoarseness, difficulty swallowing , nasal congestion. CV: Negative for chest pain, angina, palpitations, dyspnea on exertion, peripheral edema.  Respiratory: Negative for dyspnea at rest, dyspnea on exertion, cough, sputum, wheezing.  GI: See history of present illness. GU:  Negative for dysuria, hematuria, urinary incontinence, urinary frequency, nocturnal urination.  MS: Negative for joint pain, low back pain.  Derm: Negative for rash or itching.  Neuro: Negative for weakness, abnormal sensation, seizure, frequent headaches, memory loss, confusion.  Psych: Negative for anxiety, depression, suicidal ideation, hallucinations.  Endo: Negative for unusual weight change.  Heme: Negative for bruising or bleeding. Allergy: Negative for rash or hives.       Physical Examination: Vital signs in last 24 hours: Temp:  [98.1 F (36.7 C)-99.5 F (37.5 C)] 98.7 F (37.1 C) (03/17 0608) Pulse Rate:  [92-109] 92 (03/17 0608) Resp:  [16-18] 18 (03/17 0608) BP: (157-204)/(81-120) 162/81 mmHg (03/17 0608) SpO2:  [93 %-100 %] 97 % (03/17 [id number]) Weight:  [240 lb (108.863 kg)] 240 lb (108.863 kg) (03/16 0754) Last BM Date: 10/25/14  General: Well-nourished, well-developed in no acute distress. Active heaving, appears  uncomfortable. Head: Normocephalic, atraumatic.   Eyes: Conjunctiva pink, no icterus. Mouth: Oropharyngeal mucosa moist and pink , no lesions erythema or exudate. Neck: Supple without thyromegaly, masses, or lymphadenopathy.  Lungs: Clear to auscultation bilaterally.  Heart: Regular rate and rhythm, no murmurs rubs or gallops.  Abdomen: Bowel sounds are normal, moderate epigastric tenderness, nondistended, no hepatosplenomegaly or masses, no abdominal bruits or    hernia , no rebound or guarding.   Rectal: not performed Extremities: No lower extremity edema, clubbing, deformity.multiple abrasions   Neuro: Alert and oriented x 4 , grossly normal neurologically.  Skin: Warm and dry, no rash or jaundice.   Psych: Alert and cooperative, normal mood and affect.        Intake/Output from previous day: 03/16 0701 - 03/17 0700 In: 1975 [P.O.:200; I.V.:1775] Out: -  Intake/Output this shift:    Lab  Results: CBC  Recent Labs  10/25/14 0823 10/26/14 0547  WBC 8.8 9.4  HGB 15.2 13.2  HCT 41.1 36.5*  MCV 86.5 88.0  PLT 152 143*   BMET  Recent Labs  10/25/14 0823 10/26/14 0547  NA 138 135  K 3.6 3.0*  CL 104 106  CO2 24 22  GLUCOSE 255* 182*  BUN 10 11  CREATININE 1.02 0.96  CALCIUM 8.8 7.8*   LFT  Recent Labs  10/25/14 0823 10/26/14 0547  BILITOT 1.2 1.2  ALKPHOS 47 38*  AST 20 13  ALT 14 12  PROT 6.9 5.8*  ALBUMIN 3.6 3.0*    Lipase  Recent Labs  10/25/14 0823  LIPASE 18    PT/INR No results for input(s): LABPROT, INR in the last 72 hours.    Imaging Studies: US Abdomen Complete  10/25/2014   CLINICAL DATA:  Abdominal pain.  Emesis for 3 days.  EXAM: ULTRASOUND ABDOMEN COMPLETE  COMPARISON:  Abdominal ultrasound 10/24/2014 at [facility] head [place]: Gallbladder: Sludge is present in the gallbladder. There is no wall thickening or sonographic Murphy sign.  Common bile duct: Diameter: 2.5 mm, within normal limits. The focal dilation on  yesterday's exam is not visible.  Liver: The liver is somewhat echogenic throughout. No focal lesions are present.  IVC: Not visualized due to overlying bowel gas.  Pancreas: Not visualized due to overlying bowel gas.  Spleen: The spleen is of normal size and echotexture, measuring 6.9 cm maximally.  Right Kidney: Length: 13.7 cm, within normal limits. Echogenicity within normal limits. No mass or hydronephrosis visualized.  Left Kidney: Length: 13.4 cm, within normal limits. Echogenicity within normal limits. No mass or hydronephrosis visualized.  Abdominal aorta: No aneurysm visualized.  Other findings: None.  IMPRESSION: 1. No acute or focal lesion to explain the patient's symptoms. 2. Stable appearance of sludge in the gallbladder without evidence for cholecystitis. 3. Mild dilation of the common bile duct noticed yesterday is no longer present. 4. Increased echogenicity throughout the liver is again seen, suggesting hepatic steatosis.   Electronically Signed   By: San Morelle M.D.   On: 10/25/2014 12:22  [4 week]   Impression: 44 y/o male with h/o DM who presents with four day h/o acute onset N/V, epigastric pain. Reportedly had EGD last year as an inpatient that showed some inflammation. Records have been requested. Ddx includes gastritis, PUD, gastroparesis. Given change in U/S findings (resolution of CBD dilation and previous ?CBD stone) cannot exclude passage of small gallstone although clinically would expect patient to feel better at this point.   Plan: 1. Start IV protonix BID. 2. Hold heparin for possible EGD later today. To discuss with Dr. Gala Romney. 3. I have requested copy of labs done during recent ED visit at [facility] to see if any LFTs abnormalities that would support case of possible passage of stone. Cannot rule out possible need for MRCP but at this time patient would not tolerate. 4. I have requested copy of prior EGD report.   We would like to thank you for the opportunity to  participate in the care of [facility].  Laureen Ochs. Bernarda Caffey [facility] [phone] 3/17/20169:36 AM     LOS: 1 day    Attending note:  Patient seen and examined in short stay. Recent 2 ultrasounds have been reviewed with Dr. Thornton Papas. In retrospect, both sonograms demonstrate a normal hepatobiliary tree. Patient states illness preceded by hiccups alone. Hemoglobin A1c's in the 9 range  per pt report. I doubt any occult hepatobiliary pathology at this point in time.  Patient could easily have severe GERD in a setting of gastroparesis precipitating this acute illness.  Agree with EGD this afternoon.  The risks, benefits, limitations, alternatives and imponderables have been reviewed with the patient. Potential for esophageal dilation, biopsy, etc. have also been reviewed.  Questions have been answered. All parties agreeable.

## 2014-10-26 NOTE — Progress Notes (Addendum)
[facility] PROGRESS NOTE  Timothy Clark [id number] DOB: 06-May-1971 DOA: 10/25/2014 PCP: Tawni Carnes, PA-C  Assessment/Plan: Intractable Nausea/Vomiting -For EGD this afternoon. -Symptoms not well controlled despite zofran/phenergan/reglan. -Depending on EGD results, may need to look into performing gastric emptying study to rule out gastroparesis.  Elevated BP -Without dx of HTN. -BP remains elevated, likely due to stress/pain/anxiety. -PRN labetalol to be added.  Hypokalemia -Replete IV. -Check Mg  DM -CBGs elevated. -Follow and adjust insulin as needed if remains uncontrolled.  Code Status: Full Code Family Communication: Sister at bedside  Disposition Plan: Home when ready   Consultants:  GI   Antibiotics:  None   Subjective: Hiccups and emesis today.  Objective: Filed Vitals:   10/26/14 1615 10/26/14 1620 10/26/14 1625 10/26/14 1655  BP: 159/96 157/100  196/106  Pulse: 96 96 97 105  Temp:      TempSrc:      Resp: 18 19 18 18   Height:      Weight:      SpO2: 99% 99% 100% 99%    Intake/Output Summary (Last 24 hours) at 10/26/14 1717 Last data filed at 10/26/14 1600  Gross per 24 hour  Intake   1975 ml  Output      0 ml  Net   1975 ml   Filed Weights   10/25/14 0754  Weight: 108.863 kg (240 lb)    Exam:   General:  AA Ox3  Cardiovascular: RRR  Respiratory: CTA B  Abdomen: S/NT/ND/+BS  Extremities: no C/C/E   Neurologic:  nonfocal  Data Reviewed: Basic Metabolic Panel:  Recent Labs Lab 10/25/14 0823 10/26/14 0547  NA 138 135  K 3.6 3.0*  CL 104 106  CO2 24 22  GLUCOSE 255* 182*  BUN 10 11  CREATININE 1.02 0.96  CALCIUM 8.8 7.8*   Liver Function Tests:  Recent Labs Lab 10/25/14 0823 10/26/14 0547  AST 20 13  ALT 14 12  ALKPHOS 47 38*  BILITOT 1.2 1.2  PROT 6.9 5.8*  ALBUMIN 3.6 3.0*    Recent Labs Lab 10/25/14 0823  LIPASE 18   No results for input(s): AMMONIA in the last 168  hours. CBC:  Recent Labs Lab 10/25/14 0823 10/26/14 0547  WBC 8.8 9.4  NEUTROABS 6.4  --   HGB 15.2 13.2  HCT 41.1 36.5*  MCV 86.5 88.0  PLT 152 143*   Cardiac Enzymes:  Recent Labs Lab 10/25/14 0823  TROPONINI <0.03   BNP (last 3 results) No results for input(s): BNP in the last 8760 hours.  ProBNP (last 3 results) No results for input(s): PROBNP in the last 8760 hours.  CBG:  Recent Labs Lab 10/25/14 1702 10/25/14 2157 10/26/14 0800 10/26/14 1138 10/26/14 1431  GLUCAP 237* 218* 197* 185* 184*    No results found for this or any previous visit (from the past 240 hour(s)).   Studies: US Abdomen Complete  10/25/2014   CLINICAL DATA:  Abdominal pain.  Emesis for 3 days.  EXAM: ULTRASOUND ABDOMEN COMPLETE  COMPARISON:  Abdominal ultrasound 10/24/2014 at [facility] [place]: Gallbladder: Sludge is present in the gallbladder. There is no wall thickening or sonographic Murphy sign.  Common bile duct: Diameter: 2.5 mm, within normal limits. The focal dilation on yesterday's exam is not visible.  Liver: The liver is somewhat echogenic throughout. No focal lesions are present.  IVC: Not visualized due to overlying bowel gas.  Pancreas: Not visualized due to overlying bowel gas.  Spleen: The  spleen is of normal size and echotexture, measuring 6.9 cm maximally.  Right Kidney: Length: 13.7 cm, within normal limits. Echogenicity within normal limits. No mass or hydronephrosis visualized.  Left Kidney: Length: 13.4 cm, within normal limits. Echogenicity within normal limits. No mass or hydronephrosis visualized.  Abdominal aorta: No aneurysm visualized.  Other findings: None.  IMPRESSION: 1. No acute or focal lesion to explain the patient's symptoms. 2. Stable appearance of sludge in the gallbladder without evidence for cholecystitis. 3. Mild dilation of the common bile duct noticed yesterday is no longer present. 4. Increased echogenicity throughout the liver is again  seen, suggesting hepatic steatosis.   Electronically Signed   By: San Morelle M.D.   On: 10/25/2014 12:22    Scheduled Meds: . insulin aspart  0-15 Units Subcutaneous TID WC  . insulin aspart  0-5 Units Subcutaneous QHS  . lidocaine      . meperidine      . metoCLOPramide (REGLAN) injection  10 mg Intravenous 4 times per day  . midazolam      . ondansetron      . pantoprazole (PROTONIX) IV  40 mg Intravenous Q12H  . sucralfate  1 g Oral TID WC & HS   Continuous Infusions: . sodium chloride 100 mL/hr at 10/26/14 1412    Principal Problem:   Intractable vomiting with nausea Active Problems:   Diabetes   Elevated blood pressure   Nausea vomiting and diarrhea   Abdominal pain, epigastric   Mucosal abnormality of stomach   Mucosal abnormality of esophagus    Time spent: 25 minutes. Greater than 50% of this time was spent in direct contact with the patient coordinating care.    Lelon Frohlich  [facility] Pager [phone]  If 7PM-7AM, please contact night-coverage at [email or web address], password [facility] 10/26/2014, 5:17 PM  LOS: 1 day

## 2014-10-26 NOTE — Progress Notes (Signed)
Patient returned from Endo via stretcher. Patient drowsy easy to arouse when name called. Able to swallow po medication. Medicated IV for elevated BP. Sister at bedside.

## 2014-10-27 LAB — CBC
HCT: 40.9 % (ref 39.0–52.0)
Hemoglobin: 14.7 g/dL (ref 13.0–17.0)
MCH: 31.5 pg (ref 26.0–34.0)
MCHC: 35.9 g/dL (ref 30.0–36.0)
MCV: 87.6 fL (ref 78.0–100.0)
Platelets: 154 10*3/uL (ref 150–400)
RBC: 4.67 MIL/uL (ref 4.22–5.81)
RDW: 12.5 % (ref 11.5–15.5)
WBC: 11 10*3/uL — ABNORMAL HIGH (ref 4.0–10.5)

## 2014-10-27 LAB — BASIC METABOLIC PANEL
Anion gap: 12 (ref 5–15)
BUN: 11 mg/dL (ref 6–23)
CO2: 21 mmol/L (ref 19–32)
Calcium: 8.2 mg/dL — ABNORMAL LOW (ref 8.4–10.5)
Chloride: 101 mmol/L (ref 96–112)
Creatinine, Ser: 0.97 mg/dL (ref 0.50–1.35)
GFR calc Af Amer: >90 mL/min (ref >90)
GFR calc non Af Amer: >90 mL/min (ref >90)
Glucose, Bld: 220 mg/dL — ABNORMAL HIGH (ref 70–99)
Potassium: 3.8 mmol/L (ref 3.5–5.1)
Sodium: 134 mmol/L — ABNORMAL LOW (ref 135–145)

## 2014-10-27 LAB — GLUCOSE, CAPILLARY
Glucose-Capillary: 192 mg/dL — ABNORMAL HIGH (ref 70–99)
Glucose-Capillary: 220 mg/dL — ABNORMAL HIGH (ref 70–99)
Glucose-Capillary: 134 mg/dL — ABNORMAL HIGH (ref 70–99)
Glucose-Capillary: 229 mg/dL — ABNORMAL HIGH (ref 70–99)
Glucose-Capillary: 166 mg/dL — ABNORMAL HIGH (ref 70–99)

## 2014-10-27 MED ORDER — SUCRALFATE 1 GM/10ML PO SUSP
1.0000 g | Freq: Four times a day (QID) | ORAL | Status: DC | PRN
  Administered 2014-10-28 (×3): 1 g via ORAL
  Filled 2014-10-27 (×3): qty 10

## 2014-10-27 MED ORDER — METOCLOPRAMIDE HCL 5 MG/ML IJ SOLN
10.0000 mg | Freq: Three times a day (TID) | INTRAMUSCULAR | Status: DC
  Administered 2014-10-27 – 2014-10-28 (×3): 10 mg via INTRAVENOUS
  Filled 2014-10-27 (×3): qty 2

## 2014-10-27 MED ORDER — PANTOPRAZOLE SODIUM 40 MG IV SOLR
40.0000 mg | Freq: Two times a day (BID) | INTRAVENOUS | Status: DC
  Administered 2014-10-27 – 2014-10-28 (×2): 40 mg via INTRAVENOUS
  Filled 2014-10-27 (×2): qty 40

## 2014-10-27 MED ORDER — ONDANSETRON HCL 4 MG/2ML IJ SOLN: 4.0000 mg | Freq: Four times a day (QID) | INTRAMUSCULAR | Status: DC

## 2014-10-27 MED ORDER — ONDANSETRON HCL 4 MG/2ML IJ SOLN: 4.0000 mg | Freq: Three times a day (TID) | INTRAMUSCULAR | Status: DC

## 2014-10-27 MED ORDER — PROCHLORPERAZINE MALEATE 5 MG PO TABS
10.0000 mg | ORAL_TABLET | Freq: Three times a day (TID) | ORAL | Status: DC
  Administered 2014-10-27 – 2014-10-29 (×8): 10 mg via ORAL
  Filled 2014-10-27 (×9): qty 2

## 2014-10-27 MED ORDER — ONDANSETRON HCL 4 MG/2ML IJ SOLN
4.0000 mg | Freq: Four times a day (QID) | INTRAMUSCULAR | Status: DC
  Administered 2014-10-27: 4 mg via INTRAVENOUS
  Filled 2014-10-27: qty 2

## 2014-10-27 NOTE — Progress Notes (Signed)
PROGRESS NOTE  Lisle Safranski [id number] DOB: Jan 14, 1971 DOA: 10/25/2014 PCP: Jeri Modena  Summary: 44 year old man with history of diabetes presented with vomiting, abdominal pain. Admitted for intractable nausea and vomiting of unclear etiology. Noted to have elevated blood pressure.  Assessment/Plan: 1. Nausea, vomiting with epigastric pain. LFTs and lipase were unremarkable. Consider gastroparesis. Consider gastric emptying study when patient can tolerate. On Farxiga which has been associated with euglycemic DKA, however anion gapl within normal limits. 2. Distal esophagitis likely reflux related. Cannot exclude pill injury. PPI. 3. Elevated blood pressure, likely secondary to pain and anxiety. Asymptomatic. Treat underlying issues. 4. DM, stable. 5. Glaucoma    Antiemetics and Reglan per GI. Continue PPI.  Advanced to full liquid diet per GI.  Supportive care.  Code Status: full code DVT prophylaxis:  SCDs Family Communication:  Disposition Plan: home  Murray Hodgkins, MD  [facility]  Pager [phone] If 7PM-7AM, please contact night-coverage at [email or web address], password [facility] 10/27/2014, 3:53 PM  LOS: 2 days   Consultants:  Gastroenterology  Procedures:  EGD Distal esophagitis - likely reflux related although an element of pill-induced injury not excluded?"status post biopsy. Diffusely abnormal gastric mucosa of uncertain significance - status post gastric biopsy. Focal area of excoriation in the cardia most consistent with a trauma of heaving.  Antibiotics:    HPI/Subjective: Complaints of headache of which have been ongoing since admission. He has some mild upper abdominal pain. He did have some vomiting this morning but is not tolerating liquids this afternoon. Ambulating.  Objective: Filed Vitals:   10/27/14 0952 10/27/14 1104 10/27/14 1346 10/27/14 1452  BP: 192/105 190/98 181/104 189/98  Pulse: 106   110  Temp: 99 F (37.2 C)    99.2 F (37.3 C)  TempSrc: Oral   Oral  Resp: 20   20  Height:      Weight:      SpO2: 99%   98%    Intake/Output Summary (Last 24 hours) at 10/27/14 1553 Last data filed at 10/27/14 1544  Gross per 24 hour  Intake   1545 ml  Output    975 ml  Net    570 ml     Filed Weights   10/25/14 0754  Weight: 108.863 kg (240 lb)    Exam:     Labile hypertension noted, mild tachycardia, oxygenation normal on room air. General:  Appears calm, comfortable but not toxic Eyes: PERRL, normal lids, irises  ENT: grossly normal hearing Cardiovascular: RRR, no m/r/g. No LE edema. Respiratory: CTA bilaterally, no w/r/r. Normal respiratory effort. Abdomen: soft, ntnd Psychiatric: grossly normal mood and affect, speech fluent and appropriate  Data Reviewed:  Blood sugars stable  Basic metabolic panel unremarkable. Potassium normal. Anion gap normal.  Scheduled Meds: . insulin aspart  0-15 Units Subcutaneous TID WC  . insulin aspart  0-5 Units Subcutaneous QHS  . metoCLOPramide (REGLAN) injection  10 mg Intravenous 4 times per day  . ondansetron (ZOFRAN) IV  4 mg Intravenous 4 times per day  . pantoprazole (PROTONIX) IV  40 mg Intravenous Q12H  . sucralfate  1 g Oral TID WC & HS   Continuous Infusions: . sodium chloride 100 mL/hr at 10/27/14 1104    Principal Problem:   Intractable vomiting with nausea Active Problems:   Diabetes   Elevated blood pressure   Nausea vomiting and diarrhea   Abdominal pain, epigastric   Mucosal abnormality of stomach   Mucosal abnormality of esophagus   Time spent 20 minutes

## 2014-10-27 NOTE — Progress Notes (Signed)
Subjective: Patient states he's still having some nausea and vomiting with 2 episodes today, last one about an hour ago. He thinks he may have seen blood in his emesis this morning, but minimal amount if any and no recurrence with the next vomiting episode. Had a bowel movement this morning which was soft but no diarrhea and denies melena and hematochezia.Denies abdominal pain. Thinks the phenergan makes him feel sicker and the other nausea medicines are minimally effective.  Objective: Vital signs in last 24 hours: Temp:  [99 F (37.2 C)-99.7 F (37.6 C)] 99 F (37.2 C) (03/18 0952) Pulse Rate:  [88-108] 106 (03/18 0952) Resp:  [11-25] 20 (03/18 0952) BP: (122-206)/(66-137) 190/98 mmHg (03/18 1104) SpO2:  [95 %-100 %] 99 % (03/18 0952) Last BM Date: 10/27/14 General:   Alert and oriented, pleasant Head:  Normocephalic and atraumatic. Heart:  S1, S2 present, no murmurs noted.  Lungs: Clear to auscultation bilaterally, without wheezing, rales, or rhonchi.  Abdomen:  Bowel sounds present, soft, non-tender, non-distended. No HSM or hernias noted. No rebound or guarding. No masses appreciated  Extremities:  Without clubbing or edema. Neurologic:  Alert and  oriented x4;  grossly normal neurologically. Skin:  Warm and dry, intact without significant lesions.  Psych:  Alert and cooperative. Normal mood and affect.  Intake/Output from previous day: 03/17 0701 - 03/18 0700 In: 1545 [I.V.:1545] Out: -  Intake/Output this shift:    Lab Results:  Recent Labs  10/25/14 0823 10/26/14 0547 10/27/14 0624  WBC 8.8 9.4 11.0*  HGB 15.2 13.2 14.7  HCT 41.1 36.5* 40.9  PLT 152 143* 154   BMET  Recent Labs  10/25/14 0823 10/26/14 0547 10/27/14 0624  NA 138 135 134*  K 3.6 3.0* 3.8  CL 104 106 101  CO2 24 22 21   GLUCOSE 255* 182* 220*  BUN 10 11 11   CREATININE 1.02 0.96 0.97  CALCIUM 8.8 7.8* 8.2*   LFT  Recent Labs  10/25/14 0823 10/26/14 0547  PROT 6.9 5.8*   ALBUMIN 3.6 3.0*  AST 20 13  ALT 14 12  ALKPHOS 47 38*  BILITOT 1.2 1.2   PT/INR No results for input(s): LABPROT, INR in the last 72 hours. Hepatitis Panel No results for input(s): HEPBSAG, HCVAB, HEPAIGM, HEPBIGM in the last 72 hours.   Studies/Results: No results found.  Assessment: 44 y/o male with h/o DM who presents with four day h/o acute onset N/V, epigastric pain. Reportedly had EGD last year as an inpatient that showed some inflammation. Records have been requested. Ddx includes gastritis, PUD, gastroparesis.   EGD done yesterday shows distal esophagitis likely reflux-related but unable to exclude pill injury, biopsy taken. Diffusely abnormal gastric mucosa of unknown significance, biopsy taken. Focal area of excoriation in the cardia consistent with heaving trauma. Both recent US reviewed by Dr. Gala Romney with radiologist and determined both to be normal studies. After EGD, added Carafate qid, hope to taper off Reglan.   Continued nausea and vomiting with patient questioning possible minimal blood in one of his vomiting episodes. Hgb this am normal. Gastroparesis remains likely in differentials due to uncontrolled DM.  Plan: 1. Continue bid Protonix and qid Carafate 2. Zofran made scheduled q 6 hours along with Reglan which is already scheduled.  3. Check CBC in the morning 4. Agree with [facility] for workup of gastroparesis when able to tolerate.    Walden Field, AGNP-C Adult & Gerontological Nurse Practitioner [facility]     LOS: 2  days    10/27/2014, 1:20 PM

## 2014-10-27 NOTE — Progress Notes (Signed)
Notified MD of patients BP: 189/98 and patient received IV hydralazine @ 1347. Patient states that he thinks the hydralazine is causing his chills/ sweating. Will continue to monitor patient at this time.

## 2014-10-27 NOTE — Progress Notes (Signed)
Inpatient Diabetes Program Recommendations  AACE/ADA: New Consensus Statement on Inpatient Glycemic Control (2013)  Target Ranges:  Prepandial:   less than 140 mg/dL      Peak postprandial:   less than 180 mg/dL (1-2 hours)      Critically ill patients:  140 - 180 mg/dL   Results for JOHNMARK, PINTER (MRN [id number]) as of 10/27/2014 08:43  Ref. Range 10/26/2014 08:00 10/26/2014 11:38 10/26/2014 14:31 10/26/2014 16:57 10/26/2014 21:06 10/27/2014 08:37  Glucose-Capillary Latest Range: 70-99 mg/dL 197 (H) 185 (H) 184 (H) 197 (H) 192 (H) 220 (H)   Diabetes history: DM 2 Outpatient Diabetes medications: Humalin 70/30 35 units TID with meals, Farxiga 10 mg Daily Current orders for Inpatient glycemic control: Novolog 0-15 units TID, Novolog 0-5 units QHS  Inpatient Diabetes Program Recommendations Insulin - Basal: Fasting glucose 220 mg/dl this morning. Patient takes 70/30 as an outpatient and recommend ordering lower dose 70/30 while inpatient if patient is now eating and tolerating diet. HgbA1C: Last A1c in the chart was 12.7% on 10/19/09. Please consider ordering an A1c to evaluate glycemic control over the past 2-3 months.  Thanks, Barnie Alderman, RN, MSN, CCRN, CDE Diabetes Coordinator Inpatient Diabetes Program [phone] (Team Pager from [place] to [place]) [phone] ([facility] office) [phone] [facility] office)

## 2014-10-28 LAB — CBC WITH DIFFERENTIAL/PLATELET
Basophils Absolute: 0 10*3/uL (ref 0.0–0.1)
Basophils Relative: 0 % (ref 0–1)
Eosinophils Absolute: 0 10*3/uL (ref 0.0–0.7)
Eosinophils Relative: 0 % (ref 0–5)
HCT: 40 % (ref 39.0–52.0)
Hemoglobin: 14.6 g/dL (ref 13.0–17.0)
Lymphocytes Relative: 18 % (ref 12–46)
Lymphs Abs: 1.9 10*3/uL (ref 0.7–4.0)
MCH: 31.7 pg (ref 26.0–34.0)
MCHC: 36.5 g/dL — ABNORMAL HIGH (ref 30.0–36.0)
MCV: 86.8 fL (ref 78.0–100.0)
Monocytes Absolute: 0.7 10*3/uL (ref 0.1–1.0)
Monocytes Relative: 7 % (ref 3–12)
Neutro Abs: 7.8 10*3/uL — ABNORMAL HIGH (ref 1.7–7.7)
Neutrophils Relative %: 75 % (ref 43–77)
Platelets: 151 10*3/uL (ref 150–400)
RBC: 4.61 MIL/uL (ref 4.22–5.81)
RDW: 12.4 % (ref 11.5–15.5)
WBC: 10.4 10*3/uL (ref 4.0–10.5)

## 2014-10-28 LAB — GLUCOSE, CAPILLARY
Glucose-Capillary: 110 mg/dL — ABNORMAL HIGH (ref 70–99)
Glucose-Capillary: 171 mg/dL — ABNORMAL HIGH (ref 70–99)
Glucose-Capillary: 185 mg/dL — ABNORMAL HIGH (ref 70–99)
Glucose-Capillary: 196 mg/dL — ABNORMAL HIGH (ref 70–99)
Glucose-Capillary: 234 mg/dL — ABNORMAL HIGH (ref 70–99)

## 2014-10-28 MED ORDER — BRIMONIDINE TARTRATE 0.2 % OP SOLN
1.0000 [drp] | Freq: Every day | OPHTHALMIC | Status: DC
  Administered 2014-10-28: 1 [drp] via OPHTHALMIC
  Filled 2014-10-28: qty 5

## 2014-10-28 MED ORDER — POLYVINYL ALCOHOL 1.4 % OP SOLN: 1.0000 [drp] | OPHTHALMIC | Status: DC | PRN

## 2014-10-28 MED ORDER — PANTOPRAZOLE SODIUM 40 MG PO TBEC
40.0000 mg | DELAYED_RELEASE_TABLET | Freq: Two times a day (BID) | ORAL | Status: DC
  Administered 2014-10-28 – 2014-10-29 (×2): 40 mg via ORAL
  Filled 2014-10-28 (×2): qty 1

## 2014-10-28 MED ORDER — METOCLOPRAMIDE HCL 10 MG PO TABS
10.0000 mg | ORAL_TABLET | Freq: Three times a day (TID) | ORAL | Status: DC
  Administered 2014-10-28: 10 mg via ORAL
  Filled 2014-10-28: qty 1

## 2014-10-28 MED ORDER — INSULIN ASPART PROT & ASPART (70-30 MIX) 100 UNIT/ML ~~LOC~~ SUSP
20.0000 [IU] | Freq: Three times a day (TID) | SUBCUTANEOUS | Status: DC
  Administered 2014-10-28 – 2014-10-29 (×3): 20 [IU] via SUBCUTANEOUS
  Filled 2014-10-28: qty 10

## 2014-10-28 MED ORDER — TIMOLOL MALEATE 0.5 % OP SOLN
1.0000 [drp] | Freq: Every day | OPHTHALMIC | Status: DC
  Administered 2014-10-28: 1 [drp] via OPHTHALMIC
  Filled 2014-10-28: qty 5

## 2014-10-28 MED ORDER — BRIMONIDINE TARTRATE-TIMOLOL 0.2-0.5 % OP SOLN: 1.0000 [drp] | Freq: Every day | OPHTHALMIC | Status: DC

## 2014-10-28 NOTE — Progress Notes (Addendum)
Patient ID: Timothy Clark, male   DOB: 05-Dec-1970, 44 y.o.   MRN: [id number]   Assessment/Plan: ADMITTED WITH DIAPHORESIS/HICCUPS ASSOCIATED WITH NAUSEA/VOMITNIG/EPIGASTRIC PAIN. NOTED TO HAVE ELEVATED BLOOD SUGARS > 20 AT THE TIME. HAS TRIED MULTIPLE MEDS TO CONTROL NAUSEA/VOMTIING AND PT FEELS HICCUPS INDUCED BY MEDS. HAS TRIED SL ZOFRAN(?) IN THE PAST AND STATES HE TOLERATED IT. COMPAZINE SEEMS TO WORK FOR HICCUPS.  HICCUPS ARE DRIVING NAUSEA/VOMITING FOLLOWED BY ABDOMINAL PAIN. ETIOLOGY FOR HICCUPS UNCLEAR: HYPERGLYCEMIA, DOUBT OCCULT ABSCESS.  PLAN: 1. D/C REGLAN 2. CONTINUE COMPAZINE 3. ADVANCE DIET 4. CONSIDER MRI BRAIN OR CT ABD/PELVIS IF HICCUPS PERSIST AFTER BLOOD GLUCOSE CONTROLLED. 5. PT ENCOURAGED TO DISCUSS EYE MEDS WITH EYE MD SINCE STEROID SHOTS/?MED ASSOCIATED WITH HYPERGLYCEMIA.  Subjective: Since I last evaluated the patient HE DENIES HEADACHE, CHANGE IN VISION, FACE PAIN, OR SINUS PAIN, NECK PAIN, CHEST PAIN, SOB, JOINT OR  HEMATOCHEZIA, HEMATEMESIS, melena, SHORTNESS OF BREATH,  CHANGE IN BOWEL IN HABITS, constipation, abdominal pain, problems swallowing, problems with sedation, heartburn or indigestion.   Objective: Vital signs in last 24 hours: Filed Vitals:   10/27/14 2148  BP: 179/100  Pulse: 100  Temp:   Resp: 21     General appearance: alert, cooperative and no distress NECK: NO THYROMEGALY OR LYMPHADENOPATHY. Resp: clear to auscultation bilaterally Cardio: regular rate and rhythm GI: soft, non-tender; bowel sounds normal; no masses,  no organomegaly  Lab Results: GLU  3/18 220 FS MAR 19: 196-171  Studies/Results: I PERSONALLY REVIEWED FILMS FORM [place] MAR 2016-CXR: PA/LAT NACPD, CT HEAD : NAICP  Medications: I have reviewed the patient's current medications.   LOS: 5 days   Barney Drain 01/19/2014, 2:23 PM

## 2014-10-28 NOTE — Progress Notes (Signed)
Patient woke up with dry heaves, hiccups returned.  Stated he did not want Morphine.  Carafate given per orders.  SCD's in use.

## 2014-10-28 NOTE — Progress Notes (Signed)
Resting with eyes closed.  No hiccups noted at this time.

## 2014-10-28 NOTE — Progress Notes (Signed)
Timothy Clark enjoyed his lunch but hiccups returned after eating his meal. He ambulated in the halls without relief. He was given carafate  and they subsided briefly. No emesis was reported

## 2014-10-28 NOTE — Progress Notes (Signed)
PROGRESS NOTE  Timothy Clark [id number] DOB: 06/29/1971 DOA: 10/25/2014 PCP: Jeri Modena  Summary: 44 year old man with history of diabetes presented with vomiting, abdominal pain. Admitted for intractable nausea and vomiting of unclear etiology. Noted to have elevated blood pressure.  Assessment/Plan: 1. Nausea, vomiting with epigastric pain. Much improved, now tolerating diet. Intolerant to Phenergan and Zofran. On scheduled Compazine and Reglan. LFTs and lipase were unremarkable. Abdominal u/s without acute findings. Consider gastric emptying study when patient can tolerate. On Farxiga which has been associated with euglycemic DKA, however anion gap within normal limits. Per chart head CT 3/15 at [facility] was unremarkable as was CT abdomen/pelvis at [facility] 02/2014. 2. Distal esophagitis likely reflux related. Cannot exclude pill injury. Continue PPI. 3. Elevated blood pressure, likely secondary to pain and anxiety. Asymptomatic. Monitor now that hiccups resolved. 4. DM, remains stable, now on full diet, restart long 5. Glaucoma. Need list of medications from patient. Will ask family and pharmacy to assist.   Antiemetics per GI. Reglan stopped. Continue PPI. Diet advanced.  Supportive care.  Likely home 3/20 if tolerating diet.  Code Status: full code DVT prophylaxis:  SCDs Family Communication:  Disposition Plan: home  Murray Hodgkins, MD  [facility]  Pager [phone] If 7PM-7AM, please contact night-coverage at [email or web address], password [facility] 10/28/2014, 12:28 PM  LOS: 3 days   Consultants:  Gastroenterology  Procedures:  EGD Distal esophagitis - likely reflux related although an element of pill-induced injury not excluded?"status post biopsy. Diffusely abnormal gastric mucosa of uncertain significance - status post gastric biopsy. Focal area of excoriation in the cardia most consistent with a trauma of  heaving.  Antibiotics:    HPI/Subjective: Feeling better, hiccups gone. Nausea gone. No abdominal pain. Tolerated full lunch.  Objective: Filed Vitals:   10/27/14 1346 10/27/14 1452 10/27/14 1827 10/27/14 2148  BP: 181/104 189/98 154/119 179/100  Pulse:  110 117 100  Temp:  99.2 F (37.3 C) 99.1 F (37.3 C)   TempSrc:  Oral Oral Oral  Resp:  20 20 21   Height:      Weight:      SpO2:  98% 95% 99%    Intake/Output Summary (Last 24 hours) at 10/28/14 1228 Last data filed at 10/28/14 0800  Gross per 24 hour  Intake    120 ml  Output   1575 ml  Net  -1455 ml     Filed Weights   10/25/14 0754  Weight: 108.863 kg (240 lb)    Exam:     Afebrile, hypertensive, no hypoxia General: Appears calm and comfortable, sitting on side of bed Cardiovascular: RRR, no m/r/g. No LE edema. Respiratory: CTA bilaterally, no w/r/r. Normal respiratory effort. Abdomen: soft, ntnd Psychiatric: grossly normal mood and affect, speech fluent and appropriate  Data Reviewed:  Blood sugars remain stable  Basic metabolic panel unremarkable. Potassium normal. Anion gap normal.  Scheduled Meds: . insulin aspart  0-15 Units Subcutaneous TID WC  . insulin aspart  0-5 Units Subcutaneous QHS  . metoCLOPramide  10 mg Oral TID AC & HS  . pantoprazole  40 mg Oral BID  . prochlorperazine  10 mg Oral TID AC & HS   Continuous Infusions:    Principal Problem:   Intractable vomiting with nausea Active Problems:   Diabetes   Elevated blood pressure   Nausea vomiting and diarrhea   Abdominal pain, epigastric   Mucosal abnormality of stomach   Mucosal abnormality of esophagus   Time spent 15 minutes

## 2014-10-29 ENCOUNTER — Telehealth: Payer: Self-pay | Admitting: Gastroenterology

## 2014-10-29 DIAGNOSIS — R197 Diarrhea, unspecified: Secondary | ICD-10-CM

## 2014-10-29 DIAGNOSIS — R112 Nausea with vomiting, unspecified: Secondary | ICD-10-CM

## 2014-10-29 DIAGNOSIS — K209 Esophagitis, unspecified without bleeding: Secondary | ICD-10-CM

## 2014-10-29 LAB — GLUCOSE, CAPILLARY
Glucose-Capillary: 137 mg/dL — ABNORMAL HIGH (ref 70–99)
Glucose-Capillary: 65 mg/dL — ABNORMAL LOW (ref 70–99)
Glucose-Capillary: 203 mg/dL — ABNORMAL HIGH (ref 70–99)
Glucose-Capillary: 249 mg/dL — ABNORMAL HIGH (ref 70–99)

## 2014-10-29 MED ORDER — PROCHLORPERAZINE MALEATE 10 MG PO TABS
10.0000 mg | ORAL_TABLET | Freq: Four times a day (QID) | ORAL | Status: DC | PRN
Start: 2014-10-29 — End: 2015-01-16

## 2014-10-29 MED ORDER — PANTOPRAZOLE SODIUM 40 MG PO TBEC: 40.0000 mg | DELAYED_RELEASE_TABLET | Freq: Every day | ORAL | Status: DC

## 2014-10-29 MED ORDER — PANTOPRAZOLE SODIUM 40 MG PO TBEC: 40.0000 mg | DELAYED_RELEASE_TABLET | Freq: Two times a day (BID) | ORAL | Status: DC

## 2014-10-29 NOTE — Telephone Encounter (Signed)
NEEDS OPV IN 2 MOS W/ RMR E30 HICCUPS/VOMITING,/EPIGASTRIC PAIN/HYPERPGLYCEMIA.

## 2014-10-29 NOTE — Progress Notes (Signed)
Hypoglycemic Event  CBG: 65  Treatment: 15 GM carbohydrate snack  Symptoms: Sweaty  Follow-up CBG: [id number] CBG Result:137  Possible Reasons for Event: Inadequate meal intake  Comments/MD notified:    Candice Camp  Remember to initiate Hypoglycemia Order Set & complete

## 2014-10-29 NOTE — Progress Notes (Signed)
PROGRESS NOTE  Timothy Clark [id number] DOB: [birth year] DOA: 10/25/2014 PCP: Jeri Modena  Summary: 44 year old man with history of diabetes presented with vomiting, abdominal pain. Admitted for intractable nausea and vomiting of unclear etiology. Noted to have elevated blood pressure.  Assessment/Plan: 1. Nausea, vomiting with epigastric pain. Resolved. Intolerant to Phenergan and Zofran. LFTs and lipase unremarkable. Abdominal u/s without acute findings. n [place] which has been associated with euglycemic DKA, however anion gap within normal limits. Per chart head CT 3/15 at [facility] was unremarkable as was CT abdomen/pelvis at [facility] 02/2014. 2. Distal esophagitis likely reflux related. Cannot exclude pill injury. Continue PPI. 3. Hiccups, resolved, likely secondary to esophagitis. No evidence of CNS or abd process, no evidence of infection. 4. Elevated blood pressure, resolved, likely secondary to pain and anxiety.  5. DM, one episode of hypoglycemia. Overall CBG stable. Did not eat dinner last night.   Much improved. Hiccups and n/v resolved.   Home today. Discussed with Dr. Oneida Alar, she will f/u as an outpatient.   Murray Hodgkins, MD  [facility]  Pager [phone] If 7PM-7AM, please contact night-coverage at [email or web address], password [facility] 10/29/2014, 11:25 AM  LOS: 4 days   Consultants:  Gastroenterology  Procedures:  EGD Distal esophagitis - likely reflux related although an element of pill-induced injury not excluded?"status post biopsy. Diffusely abnormal gastric mucosa of uncertain significance - status post gastric biopsy. Focal area of excoriation in the cardia most consistent with a trauma of heaving.  Antibiotics:    HPI/Subjective: Episode of hypoglycemia overnight.  Much better today. Eating well, no n/v. No hiccups. Wants to go home. Thinks a lot of his problems stem from poor dentition. He is planning on extraction in  future.  Objective: Filed Vitals:   10/27/14 2148 10/28/14 1534 10/28/14 2036 10/29/14 0333  BP: 179/100 175/104 95/51 113/68  Pulse: 100 97 86 72  Temp:  98.2 F (36.8 C) 98.4 F (36.9 C) 97.9 F (36.6 C)  TempSrc: Oral Oral Oral Oral  Resp: 21 20 20 20   Height:      Weight:      SpO2: 99% 97% 98% 100%    Intake/Output Summary (Last 24 hours) at 10/29/14 1125 Last data filed at 10/28/14 1200  Gross per 24 hour  Intake    480 ml  Output      0 ml  Net    480 ml     Filed Weights   10/25/14 0754  Weight: 108.863 kg (240 lb)    Exam:     Afebrile, labile hypertension, last 113/68, 100% SpO2 on RA General:  Appears comfortable, calm. Cardiovascular: Regular rate and rhythm, no murmur, rub or gallop. Respiratory: Clear to auscultation bilaterally, no wheezes, rales or rhonchi. Normal respiratory effort. Psychiatric: grossly normal mood and affect, speech fluent and appropriate  Data Reviewed:  Blood sugars noted, episode of hypoglycemia early this AM 0305, now 200s.  Scheduled Meds: . brimonidine  1 drop Both Eyes QHS   And  . timolol  1 drop Both Eyes QHS  . insulin aspart  0-15 Units Subcutaneous TID WC  . insulin aspart  0-5 Units Subcutaneous QHS  . insulin aspart protamine- aspart  20 Units Subcutaneous TID  . pantoprazole  40 mg Oral BID  . prochlorperazine  10 mg Oral TID AC & HS   Continuous Infusions:    Principal Problem:   Intractable vomiting with nausea Active Problems:   Diabetes   Elevated blood pressure   Nausea vomiting and diarrhea  Abdominal pain, epigastric   Mucosal abnormality of stomach   Mucosal abnormality of esophagus

## 2014-10-29 NOTE — Progress Notes (Signed)
Patient discharged home today.  Patient was given discharge instructions, prescriptions, and care notes.  Patient verbalized understanding with no complaints or concerns voiced at this time. Patient's IV was removed yesterday due to infiltration.  Patient left unit, ambulating, in stable condition by staff a member.

## 2014-10-29 NOTE — Discharge Summary (Addendum)
Physician Discharge Summary  Timothy Clark [id number] DOB: 10-06-1970 DOA: 10/25/2014  PCP: Jeri Modena  Admit date: 10/25/2014 Discharge date: 10/29/2014  Recommendations for Outpatient Follow-up:  1. Resolution of distal esophagitis, likely reflux related. 2. If hiccups recur, further imaging could be considered including MRI of the brain or CT chest abdomen and pelvis.    Follow-up Information    Follow up with [facility], PA-C In 2 weeks.   Specialty:  Physician Assistant   Contact information:   [place] [id number]      Discharge Diagnoses:  1. Nausea, vomiting, epigastric pain. Resolved. 2. Distal esophagitis, likely reflux related. 3. Hiccups. 4. Elevated blood pressure secondary to pain. 5. Diabetes mellitus  Discharge Condition: Improved Disposition: Home  Diet recommendation: Diabetic diet  Filed Weights   10/25/14 0754  Weight: 108.863 kg (240 lb)    History of present illness:  44 year old man with history of diabetes presented with vomiting, abdominal pain. Admitted for intractable nausea and vomiting of unclear etiology. Noted to have elevated blood pressure.  Hospital Course:  [name] was admitted and seen in consultation with gastroenterology. Hiccups, nausea and vomiting waxed and waned but have subsequently involved. He has had extensive evaluation, predominantly at [facility] as documented by GI. EGD suggested distal esophagitis, likely reflux related. May be inciting event for hiccups, also consider diabetes mellitus. He has no evidence of infection or CNS process at this point. As hiccups have resolved and he tolerating a diet, no further imaging has been pursued. Of note he reportedly had an unremarkable head CT at [facility] recently. Discussed with Dr. Oneida Alar today, patient is stable for discharge. Individual issues as below.  1. Nausea, vomiting with epigastric pain. Resolved. Intolerant to Phenergan  and Zofran. LFTs and lipase unremarkable. Abdominal u/s without acute findings. On Farxiga which has been associated with euglycemic DKA, however anion gap within normal limits. Per chart head CT 3/15 at [facility] was unremarkable as was CT abdomen/pelvis at [facility] 02/2014. 2. Distal esophagitis likely reflux related. Cannot exclude pill injury. Continue PPI. 3. Hiccups, resolved, likely secondary to esophagitis. No evidence of CNS or abd process, no evidence of infection. 4. Elevated blood pressure, resolved, likely secondary to pain and anxiety.  5. DM, one episode of hypoglycemia. Overall CBG stable. Did not eat dinner last night.  Consultants:  Gastroenterology  Procedures:  EGD Distal esophagitis - likely reflux related although an element of pill-induced injury not excluded?"status post biopsy. Diffusely abnormal gastric mucosa of uncertain significance - status post gastric biopsy. Focal area of excoriation in the cardia most consistent with a trauma of heaving.  Discharge Instructions  Discharge Instructions    Activity as tolerated - No restrictions    Complete by:  As directed      Diet Carb Modified    Complete by:  As directed      Discharge instructions    Complete by:  As directed   Call your physician or seek immediate medical attention for recurrent nausea, vomiting, pain or hiccups that won't stop.          Current Discharge Medication List    START taking these medications   Details  prochlorperazine (COMPAZINE) 10 MG tablet Take 1 tablet (10 mg total) by mouth every 6 (six) hours as needed for nausea or vomiting. Qty: 30 tablet, Refills: 0      CONTINUE these medications which have CHANGED   Details  pantoprazole (PROTONIX) 40 MG tablet Take 1  tablet (40 mg total) by mouth 2 (two) times daily. Qty: 60 tablet, Refills: 0      CONTINUE these medications which have NOT CHANGED   Details  brimonidine-timolol (COMBIGAN) 0.2-0.5 % ophthalmic solution Place 1  drop into both eyes at bedtime.    cyclobenzaprine (FLEXERIL) 10 MG tablet Take 10 mg by mouth at bedtime as needed for muscle spasms.    Dapagliflozin Propanediol (FARXIGA) 10 MG TABS Take 10 mg by mouth daily.    gabapentin (NEURONTIN) 600 MG tablet Take 600 mg by mouth 2 (two) times daily.    hydroxypropyl methylcellulose / hypromellose (ISOPTO TEARS / GONIOVISC) 2.5 % ophthalmic solution Place 1 drop into both eyes as needed for dry eyes.    Insulin Isophane & Regular Human (HUMULIN 70/30 KWIKPEN) (70-30) 100 UNIT/ML PEN Inject 35 Units into the skin 3 (three) times daily.        Allergies  Allergen Reactions  . Aspirin Hives and Itching  . Doxycycline Hives and Itching  . Penicillins Hives and Itching  . Phenergan [Promethazine Hcl] Nausea And Vomiting  . Zofran [Ondansetron Hcl] Other (See Comments)    Hiccups     The results of significant diagnostics from this hospitalization (including imaging, microbiology, ancillary and laboratory) are listed below for reference.    Significant Diagnostic Studies: US Abdomen Complete  10/25/2014   CLINICAL DATA:  Abdominal pain.  Emesis for 3 days.  EXAM: ULTRASOUND ABDOMEN COMPLETE  COMPARISON:  Abdominal ultrasound 10/24/2014 at more head [place]: Gallbladder: Sludge is present in the gallbladder. There is no wall thickening or sonographic Murphy sign.  Common bile duct: Diameter: 2.5 mm, within normal limits. The focal dilation on yesterday's exam is not visible.  Liver: The liver is somewhat echogenic throughout. No focal lesions are present.  IVC: Not visualized due to overlying bowel gas.  Pancreas: Not visualized due to overlying bowel gas.  Spleen: The spleen is of normal size and echotexture, measuring 6.9 cm maximally.  Right Kidney: Length: 13.7 cm, within normal limits. Echogenicity within normal limits. No mass or hydronephrosis visualized.  Left Kidney: Length: 13.4 cm, within normal limits. Echogenicity  within normal limits. No mass or hydronephrosis visualized.  Abdominal aorta: No aneurysm visualized.  Other findings: None.  IMPRESSION: 1. No acute or focal lesion to explain the patient's symptoms. 2. Stable appearance of sludge in the gallbladder without evidence for cholecystitis. 3. Mild dilation of the common bile duct noticed yesterday is no longer present. 4. Increased echogenicity throughout the liver is again seen, suggesting hepatic steatosis.   Electronically Signed   By: San Morelle M.D.   On: 10/25/2014 12:22   Labs: Basic Metabolic Panel:  Recent Labs Lab 10/25/14 0823 10/26/14 0547 10/26/14 2036 10/27/14 0624  NA 138 135  --  134*  K 3.6 3.0*  --  3.8  CL 104 106  --  101  CO2 24 22  --  21  GLUCOSE 255* 182*  --  220*  BUN 10 11  --  11  CREATININE 1.02 0.96  --  0.97  CALCIUM 8.8 7.8*  --  8.2*  MG  --   --  1.9  --    Liver Function Tests:  Recent Labs Lab 10/25/14 0823 10/26/14 0547  AST 20 13  ALT 14 12  ALKPHOS 47 38*  BILITOT 1.2 1.2  PROT 6.9 5.8*  ALBUMIN 3.6 3.0*    Recent Labs Lab 10/25/14 0823  LIPASE 18  CBC:  Recent Labs Lab 10/25/14 0823 10/26/14 0547 10/27/14 0624 10/28/14 0627  WBC 8.8 9.4 11.0* 10.4  NEUTROABS 6.4  --   --  7.8*  HGB 15.2 13.2 14.7 14.6  HCT 41.1 36.5* 40.9 40.0  MCV 86.5 88.0 87.6 86.8  PLT 152 143* 154 151   Cardiac Enzymes:  Recent Labs Lab 10/25/14 0823  TROPONINI <0.03   CBG:  Recent Labs Lab 10/28/14 2332 10/29/14 0305 10/29/14 0328 10/29/14 0721 10/29/14 1122  GLUCAP 185* 65* 137* 203* 249*    Principal Problem:   Intractable vomiting with nausea Active Problems:   Diabetes   Elevated blood pressure   Nausea vomiting and diarrhea   Abdominal pain, epigastric   Mucosal abnormality of stomach   Mucosal abnormality of esophagus   Acute esophagitis   Time coordinating discharge: 35 minutes  Signed:  Murray Hodgkins, MD [facility] 10/29/2014, 11:55 AM

## 2014-10-29 NOTE — Progress Notes (Signed)
Patient ID: Timothy Clark, male   DOB: [birth year], 44 y.o.   MRN: [id number]   Assessment/Plan: ADMITTED WITH HICCUPS/DIAPHORESIS/NAUSEA/VOMITING/EPIGASTRIC PAIN. CLINICALLY IMPROVED.  PLAN: 1/ D/C HOME TODAY ON COMPAZINE 10 MG QAC TID 2/ DIABETIC DIET 3. PT SHOULD AIM FOR HGA1C < 7.0 4. OPV IN 2 MOS W/ DR. Gala Romney.   Subjective: Since I last evaluated the patient HE HAD ONE EPISODE OF HICCUPS AROUND 4-5 PM. NO NAUSEA/VOMITING,/EPIGASTRIC PAIN.   Objective: Vital signs in last 24 hours: Filed Vitals:   10/29/14 0333  BP: 113/68  Pulse: 72  Temp: 97.9 F (36.6 C)  Resp: 20    General appearance: alert, cooperative and no distress Resp: clear to auscultation bilaterally Cardio: regular rate and rhythm GI: soft, non-tender; bowel sounds normal  Lab Results: BGFS 249  Studies/Results: No results found.  Medications: I have reviewed the patient's current medications.   LOS: 5 days   Barney Drain 01/19/2014, 2:23 PM

## 2014-10-30 ENCOUNTER — Encounter: Payer: Self-pay | Admitting: Internal Medicine

## 2014-10-30 NOTE — Progress Notes (Signed)
UR chart review completed.  

## 2014-10-30 NOTE — Telephone Encounter (Signed)
APPOINTMENT MADE AND LETTER SENT °

## 2014-10-31 ENCOUNTER — Encounter (HOSPITAL_COMMUNITY): Payer: Self-pay | Admitting: Internal Medicine

## 2014-10-31 ENCOUNTER — Encounter: Payer: Self-pay | Admitting: Internal Medicine

## 2014-11-01 ENCOUNTER — Telehealth: Payer: Self-pay

## 2014-11-01 NOTE — Telephone Encounter (Signed)
Per RMR-  Send letter to patient.  Send copy of letter with path to referring provider and PCP. Allergies to doxycycline and penicillin makes treatment options very challenging. he does have allergies to these medications, he does need to see the ID doctor for treatment.

## 2014-11-01 NOTE — Telephone Encounter (Signed)
Letter mailed to the pt.  Please cc pcp. Please refer pt.

## 2014-11-20 NOTE — Telephone Encounter (Signed)
Referral has been made.

## 2014-11-21 ENCOUNTER — Encounter (HOSPITAL_COMMUNITY): Payer: Self-pay | Admitting: Emergency Medicine

## 2014-11-21 ENCOUNTER — Emergency Department (HOSPITAL_COMMUNITY): Payer: Medicaid Other

## 2014-11-21 ENCOUNTER — Emergency Department (HOSPITAL_COMMUNITY)
Admission: EM | Admit: 2014-11-21 | Discharge: 2014-11-21 | Disposition: A | Discharge status: Home / Self Care | Payer: Medicaid Other | Attending: Emergency Medicine | Admitting: Emergency Medicine

## 2014-11-21 DIAGNOSIS — Z79899 Other long term (current) drug therapy: Secondary | ICD-10-CM | POA: Diagnosis not present

## 2014-11-21 DIAGNOSIS — H409 Unspecified glaucoma: Secondary | ICD-10-CM | POA: Insufficient documentation

## 2014-11-21 DIAGNOSIS — K219 Gastro-esophageal reflux disease without esophagitis: Secondary | ICD-10-CM | POA: Diagnosis not present

## 2014-11-21 DIAGNOSIS — Z794 Long term (current) use of insulin: Secondary | ICD-10-CM | POA: Insufficient documentation

## 2014-11-21 DIAGNOSIS — E119 Type 2 diabetes mellitus without complications: Secondary | ICD-10-CM | POA: Insufficient documentation

## 2014-11-21 DIAGNOSIS — Z880 Allergy status to penicillin: Secondary | ICD-10-CM | POA: Insufficient documentation

## 2014-11-21 DIAGNOSIS — R1013 Epigastric pain: Secondary | ICD-10-CM | POA: Insufficient documentation

## 2014-11-21 DIAGNOSIS — R1010 Upper abdominal pain, unspecified: Secondary | ICD-10-CM | POA: Diagnosis present

## 2014-11-21 HISTORY — DX: Esophagitis, unspecified without bleeding: K20.90

## 2014-11-21 HISTORY — DX: Gastro-esophageal reflux disease without esophagitis: K21.9

## 2014-11-21 HISTORY — DX: Hiccough: R06.6

## 2014-11-21 HISTORY — DX: Gastritis, unspecified, without bleeding: K29.70

## 2014-11-21 HISTORY — DX: Esophagitis, unspecified: K20.9

## 2014-11-21 LAB — COMPREHENSIVE METABOLIC PANEL
ALT: 18 U/L (ref 0–53)
AST: 21 U/L (ref 0–37)
Albumin: 3.3 g/dL — ABNORMAL LOW (ref 3.5–5.2)
Alkaline Phosphatase: 45 U/L (ref 39–117)
Anion gap: 6 (ref 5–15)
BUN: 9 mg/dL (ref 6–23)
CO2: 27 mmol/L (ref 19–32)
Calcium: 8.9 mg/dL (ref 8.4–10.5)
Chloride: 105 mmol/L (ref 96–112)
Creatinine, Ser: 0.9 mg/dL (ref 0.50–1.35)
GFR calc Af Amer: >90 mL/min (ref >90)
GFR calc non Af Amer: >90 mL/min (ref >90)
Glucose, Bld: 201 mg/dL — ABNORMAL HIGH (ref 70–99)
Potassium: 4 mmol/L (ref 3.5–5.1)
Sodium: 138 mmol/L (ref 135–145)
Total Bilirubin: 0.8 mg/dL (ref 0.3–1.2)
Total Protein: 6.6 g/dL (ref 6.0–8.3)

## 2014-11-21 LAB — CBC WITH DIFFERENTIAL/PLATELET
Basophils Absolute: 0 10*3/uL (ref 0.0–0.1)
Basophils Relative: 0 % (ref 0–1)
Eosinophils Absolute: 0 10*3/uL (ref 0.0–0.7)
Eosinophils Relative: 0 % (ref 0–5)
HCT: 39.4 % (ref 39.0–52.0)
Hemoglobin: 14 g/dL (ref 13.0–17.0)
Lymphocytes Relative: 21 % (ref 12–46)
Lymphs Abs: 1.6 10*3/uL (ref 0.7–4.0)
MCH: 32 pg (ref 26.0–34.0)
MCHC: 35.5 g/dL (ref 30.0–36.0)
MCV: 90.2 fL (ref 78.0–100.0)
Monocytes Absolute: 0.5 10*3/uL (ref 0.1–1.0)
Monocytes Relative: 6 % (ref 3–12)
Neutro Abs: 5.8 10*3/uL (ref 1.7–7.7)
Neutrophils Relative %: 73 % (ref 43–77)
Platelets: 157 10*3/uL (ref 150–400)
RBC: 4.37 MIL/uL (ref 4.22–5.81)
RDW: 12.9 % (ref 11.5–15.5)
WBC: 7.9 10*3/uL (ref 4.0–10.5)

## 2014-11-21 LAB — LIPASE, BLOOD: Lipase: 20 U/L (ref 11–59)

## 2014-11-21 MED ORDER — GI COCKTAIL ~~LOC~~
30.0000 mL | Freq: Once | ORAL | Status: AC
  Administered 2014-11-21: 30 mL via ORAL
  Filled 2014-11-21: qty 30

## 2014-11-21 MED ORDER — FAMOTIDINE 20 MG PO TABS
40.0000 mg | ORAL_TABLET | Freq: Once | ORAL | Status: AC
  Administered 2014-11-21: 40 mg via ORAL
  Filled 2014-11-21: qty 2

## 2014-11-21 MED ORDER — IOHEXOL 300 MG/ML  SOLN
100.0000 mL | Freq: Once | INTRAMUSCULAR | Status: AC | PRN
  Administered 2014-11-21: 100 mL via INTRAVENOUS

## 2014-11-21 MED ORDER — HYDROMORPHONE HCL 1 MG/ML IJ SOLN
1.0000 mg | Freq: Once | INTRAMUSCULAR | Status: AC
  Administered 2014-11-21: 1 mg via INTRAMUSCULAR
  Filled 2014-11-21: qty 1

## 2014-11-21 MED ORDER — IOHEXOL 300 MG/ML  SOLN
50.0000 mL | Freq: Once | INTRAMUSCULAR | Status: AC | PRN
  Administered 2014-11-21: 50 mL via ORAL

## 2014-11-21 MED ORDER — SODIUM CHLORIDE 0.9 % IV SOLN
INTRAVENOUS | Status: DC
  Administered 2014-11-21: 18:00:00 via INTRAVENOUS

## 2014-11-21 NOTE — Discharge Instructions (Signed)
°Emergency Department Resource Guide °1) Find a Doctor and Pay Out of Pocket °Although you won't have to find out who is covered by your insurance plan, it is a good idea to ask around and get recommendations. You will then need to call the office and see if the doctor you have chosen will accept you as a new patient and what types of options they offer for patients who are self-pay. Some doctors offer discounts or will set up payment plans for their patients who do not have insurance, but you will need to ask so you aren't surprised when you get to your appointment. ° °2) Contact Your Local Health Department °Not all health departments have doctors that can see patients for sick visits, but many do, so it is worth a call to see if yours does. If you don't know where your local health department is, you can check in your phone book. The CDC also has a tool to help you locate your state's health department, and many state websites also have listings of all of their local health departments. ° °3) Find a Walk-in Clinic °If your illness is not likely to be very severe or complicated, you may want to try a walk in clinic. These are popping up all over the country in pharmacies, drugstores, and shopping centers. They're usually staffed by nurse practitioners or physician assistants that have been trained to treat common illnesses and complaints. They're usually fairly quick and inexpensive. However, if you have serious medical issues or chronic medical problems, these are probably not your best option. ° °No Primary Care Doctor: °- Call [facility] at  832-8000 - they can help you locate a primary care doctor that  accepts your insurance, provides certain services, etc. °- Physician Referral Service- 1-800-533-3463 ° °Chronic Pain Problems: °Organization         Address  Phone   Notes  °[facility]  ([phone]) [phone] Patients need to be referred by their primary care doctor.  ° °Medication  Assistance: °Organization         Address  Phone   Notes  °[place] [facility] [address] °[place], [place] [address] (336) 641-8030 --Must be a resident of [place] °-- Must have NO insurance coverage whatsoever (no Medicaid/ Medicare, etc.) °-- The pt. MUST have a primary care doctor that directs their care regularly and follows them in the community °  °[facility]  (866) 331-1348   °[employer]  (888) 892-1162   ° °Agencies that provide inexpensive medical care: °Organization         Address  Phone   Notes  °[facility]  (336) 832-8035   °[facility]    (336) 832-7272   °[facility] [address] °[place], [place] [address] (336) 832-4777   °[facility] [address], °[place] (336) 271-4999   °[facility]    (336) 373-0678   °[facility]    (336) 272-1050   °[facility] ° [address],  Phone:  (336) 832-4444, Fax:  (336) 832-4440 Hours of Operation:  9 am - 6 pm, M-F.  Also accepts Medicaid/Medicare and self-pay.  °[facility] ° [address],  Phone: (336) 832-3150, Fax: (336) [phone]. Hours of Operation:  8:30 am - 5:30 pm, M-F.  Also accepts Medicaid and self-pay.  °[facility] [place] [address], [place] Phone: (336) 878-6027   °[facility] [address], [place], [place] (336)723-1848, Ext. 123 Mondays & Thursdays: 7-9 AM.  First 15 patients are seen on a first come, first serve basis. °  ° °Medicaid-accepting [place] Providers: ° °Organization         Address  Phone   Notes  °[facility] [address], [place] (336) 641-2100 Also accepts self-pay patients.  °[facility] [address], [place] ° (336) 856-9996   °[facility] [address], [place]  (336) 288-8857   °[facility] [address] [place] [address], [place] (336) 299-7000   °Veita Bland [address], [place]  ° (336) 373-1557 Only accepts [place] Access Medicaid patients after they have their name applied to their card.  ° °Self-Pay (no insurance) in [place]: ° °Organization         Address  Phone   Notes  °Sickle Cell Patients, [facility] [address], [place] (336) 832-1970   °[facility] [address], [place] (336) 832-4400   °[facility] ° [address], [place] (336) 992-4800   °[facility]/Dr. Osei-Bonsu ° [address] [place] [address], [place] or [address], [place] (336) [phone] Phone number for both [place] and [place] locations is the same.  °[facility] [address], [place] (336) 299-0000   °[facility] [address] [place] [address], [place] or [address] (336) 852-7530 °(336) 878-2260   °[facility] [address], [place] (336) 350-1642, phone; (336) 294-5005, fax Sees patients 1st and 3rd Saturday of every month.  Must not qualify for public or private insurance (i.e. Medicaid, Medicare, [place] Health Choice, Veterans' Benefits) • Household income should be no more than 200% of the poverty level •The clinic cannot treat you if you are pregnant or think you are pregnant • Sexually transmitted diseases are not treated at the clinic.  ° ° °Dental Care: °Organization         Address  Phone  Notes  °[place] [facility] [address], [place] (336) 641-6152 Accepts children up to age 21 who are enrolled in Medicaid or [place] Health Choice; pregnant women with a Medicaid card; and children who have applied for Medicaid or [place] Health Choice, but were declined, whose parents can pay a reduced fee at time of service.  °[place]  [facility] [place]  [address], [place] (336) 641-7733 Accepts children up to age 21 who are enrolled in Medicaid or [place] Health Choice; pregnant women with a Medicaid card; and children who have applied for Medicaid or [place] Health Choice, but were declined, whose parents can pay a reduced fee at time of service.  °[facility] ° [address], [place] (336) 641-4533 Patients are seen by appointment only. Walk-ins are not accepted. [facility] will see patients 18 years of age and older. °Monday - Tuesday (8am-5pm) °Most Wednesdays (8:30-5pm) °$30 per visit, cash only  °[facility] ° [address], [place] (336) 641-4533 Patients are seen by appointment only. Walk-ins are not accepted. [facility] will see patients 18 years of age and older. °One   Wednesday Evening (Monthly: Volunteer Based).  $30 per visit, cash only  °[facility]  (919) 537-3737 for adults; Children under age 4, call Graduate Pediatric Dentistry at (919) [phone]. Children aged 4-14, please call (919) [phone] to request a pediatric application. ° Dental services are provided in all areas of dental care including fillings, crowns and bridges, complete and partial dentures, implants, gum treatment, root canals, and extractions. Preventive care is also provided. Treatment is provided to both adults and children. °Patients are selected via a lottery and there is often a waiting list. °  °[facility] [address], °[place] ° (336) 763-8833 [email or web address].[email or web address] °  °[facility] [address], [place], [place] (336)723-1848, Ext. 123 Second and Fourth Thursday of each month, opens at 6:30 AM; Clinic ends at 9 AM.  Patients are seen on a first-come first-served basis, and a limited number are seen during each clinic.  ° °[facility] ° [address], [place], [place] (336) [phone]    Eligibility Requirements °You must have lived in [place], or [place] counties for at least the last three months. °  You cannot be eligible for state or federal sponsored healthcare insurance, including Veterans Administration, Medicaid, or Medicare. °  You generally cannot be eligible for healthcare insurance through your employer.  °  How to apply: °Eligibility screenings are held every Tuesday and Wednesday afternoon from 1:00 pm until 4:00 pm. You do not need an appointment for the interview!  °[facility] [address], [place], [place] 336-631-2330   °[facility]  336-342-8273   °[facility]  336-703-3100   °[facility]  336-570-6415   ° °Behavioral Health Resources in the Community: °Intensive Outpatient Programs °Organization         Address  Phone  Notes  °[facility] [address], [place], [place] 336-878-6098   °[facility] [address], [place], [place] 336-832-9800   °[facility] [address], [place], [place] ° 336-882-2125   °[facility] [address],  °[place], [place] 1-800-853-5163 or 336-641-4981   °Substance Abuse Resources °Organization         Address  Phone  Notes  °[facility]  336-882-2125   °[facility]  336-784-9470   °[facility]  336-285-9073   °[facility]  336-845-3988   °Residential & Outpatient Substance Abuse Program  1-800-659-3381   °Psychological Services °Organization         Address  Phone  Notes  °[facility]  336- 832-9600   °[facility]  336- 378-7881   °[facility] [address], [place] 1-800-853-5163 or 336-641-4981   ° °Mobile Crisis Teams °Organization         Address  Phone  Notes  °[facility], Mobile Crisis Care Unit  1-877-626-1772   °Assertive °[facility] ° [address]  [place], [place] 336-834-9664   °Sharon DeEsch [address] °[place] 336-554-5454   ° °Self-Help/Support Groups °Organization         Address  Phone             Notes  °[facility]  - variety of support groups  336- 373-1402 Call for more information  °Narcotics Anonymous (NA), [facility] [address], °[place]  2 meetings at this location  ° °  Residential Treatment Programs Organization         Address  Phone  Notes  [facility] [address],    [place]  [phone]   [facility]  [address], [place] [id number], [place], [place]   [facility] [place], [place] [phone] Admissions: 8am-3pm M-F  [facility] [place] [address]    [place], [place] [id number]   [facility] [address] [place], [place], [place]   [facility] [address]  [place], [place]   [facility] Intensive Outpatient [address], [place], [place]   [facility] [address]  [place], [place] 1-312-071-4561 or (253)034-8936   [facility] [address] [place], [place] Accepts Medicaid  [facility] [place] [address],  [place] 1-(604) [phone] Substance Abuse/Addiction Treatment   [facility] Organization         Address  Phone  Notes  [facility]  479-569-3876   Domenic Schwab, PhD [address] [place], [place]   6135990147 or 980-468-9919   [facility] [place], [place] (530)[phone]   [facility] [address], [place], [place] [phone] Insurance/Medicaid/sponsorship through [facility] [address]                                    [place], [place] [phone] [place] [address], [place] [phone]    Dr. Adele Schilder  ([phone]   [facility] Dept. 1) [address], [place] 2) [place] 3)  [address] [place] [address], [place] [phone]  [phone]   [place] [phone] or ([phone] (After Hours)      Eat a bland diet, avoiding greasy, fatty, fried foods, as well as spicy and acidic foods or beverages.  Avoid eating within the hour or 2 before going to bed or laying down.  Also avoid teas, colas, coffee, chocolate, pepermint and spearment.  Take over the counter pepcid, one or two tablets by mouth twice a day, for the next 2 weeks.  May also take over the counter maalox/mylanta, as directed on packaging, as needed for discomfort.  Take your usual prescriptions as previously directed.  Call your regular GI doctor tomorrow morning to schedule a follow up appointment in the next 2 days.  Return to the Emergency Department immediately if worsening.

## 2014-11-21 NOTE — ED Provider Notes (Signed)
CSN: [id number]     Arrival date & time 11/21/14  1548 History   First MD Initiated Contact with Patient 11/21/14 1602     Chief Complaint  Patient presents with  . Abdominal Pain      HPI Pt was seen at 1605.  Per pt, c/o gradual onset and persistence of constant upper abd "pain" since this morning.  Describes the abd pain as "sharp." States he took his usual PPI this morning with "some" improvement. Pt endorses hx of similar symptoms last month, dx Hpylori, and rx Protonix only due to multiple antibiotic allergies. Pt did not f/u with GI MD after hospitalization "because I felt better." Pt was referred to ID MD regarding meds to tx Hpylori, but he did not follow up.  Denies N/V, no diarrhea, no fevers, no back pain, no rash, no CP/SOB, no black or blood in stools.       Past Medical History  Diagnosis Date  . Diabetes mellitus without complication     diagnosed around age 25  . Glaucoma   . Eye hemorrhage   . Gastritis   . GERD (gastroesophageal reflux disease)   . Esophagitis   . Hiccups    Past Surgical History  Procedure Laterality Date  . Eye surgery    . Esophagogastroduodenoscopy  2015    Dr. Britta Mccreedy  . Esophagogastroduodenoscopy N/A 10/26/2014    Procedure: ESOPHAGOGASTRODUODENOSCOPY (EGD);  Surgeon: Daneil Dolin, MD;  Location: [facility];  Service: Endoscopy;  Laterality: N/A;   Family History  Problem Relation Age of Onset  . Ovarian cancer Mother   . Cervical cancer Sister   . Diabetes Sister   . Colon cancer Neg Hx    History  Substance Use Topics  . Smoking status: Never Smoker   . Smokeless tobacco: Not on file  . Alcohol Use: No    Review of Systems ROS: Statement: All systems negative except as marked or noted in the HPI; Constitutional: Negative for fever and chills. ; ; Eyes: Negative for eye pain, redness and discharge. ; ; ENMT: Negative for ear pain, hoarseness, nasal congestion, sinus pressure and sore throat. ; ; Cardiovascular: Negative for  chest pain, palpitations, diaphoresis, dyspnea and peripheral edema. ; ; Respiratory: Negative for cough, wheezing and stridor. ; ; Gastrointestinal: +abd pain. Negative for nausea, vomiting, diarrhea, blood in stool, hematemesis, jaundice and rectal bleeding. . ; ; Genitourinary: Negative for dysuria, flank pain and hematuria. ; ; Musculoskeletal: Negative for back pain and neck pain. Negative for swelling and trauma.; ; Skin: Negative for pruritus, rash, abrasions, blisters, bruising and skin lesion.; ; Neuro: Negative for headache, lightheadedness and neck stiffness. Negative for weakness, altered level of consciousness , altered mental status, extremity weakness, paresthesias, involuntary movement, seizure and syncope.     Allergies  Aspirin; Doxycycline; Penicillins; Phenergan; and Zofran  Home Medications   Prior to Admission medications   Medication Sig Start Date End Date Taking? Authorizing Provider  brimonidine-timolol (COMBIGAN) 0.2-0.5 % ophthalmic solution Place 1 drop into both eyes at bedtime.    Historical Provider, MD  cyclobenzaprine (FLEXERIL) 10 MG tablet Take 10 mg by mouth at bedtime as needed for muscle spasms.    Historical Provider, MD  Dapagliflozin Propanediol (FARXIGA) 10 MG TABS Take 10 mg by mouth daily.    Historical Provider, MD  gabapentin (NEURONTIN) 600 MG tablet Take 600 mg by mouth 2 (two) times daily.    Historical Provider, MD  hydroxypropyl methylcellulose / hypromellose (ISOPTO TEARS /  GONIOVISC) 2.5 % ophthalmic solution Place 1 drop into both eyes as needed for dry eyes.    Historical Provider, MD  Insulin Isophane & Regular Human (HUMULIN 70/30 KWIKPEN) (70-30) 100 UNIT/ML PEN Inject 35 Units into the skin 3 (three) times daily.     Historical Provider, MD  pantoprazole (PROTONIX) 40 MG tablet Take 1 tablet (40 mg total) by mouth 2 (two) times daily. 10/29/14   Samuella Cota, MD  prochlorperazine (COMPAZINE) 10 MG tablet Take 1 tablet (10 mg total) by  mouth every 6 (six) hours as needed for nausea or vomiting. 10/29/14   Samuella Cota, MD   BP 152/88 mmHg  Pulse 96  Temp(Src) 98.4 F (36.9 C)  Resp 14  Ht 6' (1.829 m)  Wt 240 lb (108.863 kg)  BMI 32.54 kg/m2  SpO2 100% Physical Exam  1610: Physical examination:  Nursing notes reviewed; Vital signs and O2 SAT reviewed;  Constitutional: Well developed, Well nourished, Well hydrated, In no acute distress; Head:  Normocephalic, atraumatic; Eyes: EOMI, PERRL, No scleral icterus; ENMT: Mouth and pharynx normal, Mucous membranes moist; Neck: Supple, Full range of motion, No lymphadenopathy; Cardiovascular: Regular rate and rhythm, No murmur, rub, or gallop; Respiratory: Breath sounds clear & equal bilaterally, No rales, rhonchi, wheezes.  Speaking full sentences with ease, Normal respiratory effort/excursion; Chest: Nontender, Movement normal; Abdomen: Soft, +very mild mid-epigastric tenderness to palp. No rebound or guarding. Nondistended, Normal bowel sounds; Genitourinary: No CVA tenderness; Extremities: Pulses normal, No tenderness, No edema, No calf edema or asymmetry.; Neuro: AA&Ox3, Major CN grossly intact.  Speech clear. No gross focal motor or sensory deficits in extremities. Climbs on and off stretcher easily by himself. Gait steady.; Skin: Color normal, Warm, Dry.   ED Course  Procedures     EKG Interpretation None      MDM  MDM Reviewed: previous chart, nursing note and vitals Reviewed previous: labs Interpretation: labs, x-ray and CT scan     Results for orders placed or performed during the hospital encounter of 11/21/14  Comprehensive metabolic panel  Result Value Ref Range   Sodium 138 135 - 145 mmol/L   Potassium 4.0 3.5 - 5.1 mmol/L   Chloride 105 96 - 112 mmol/L   CO2 27 19 - 32 mmol/L   Glucose, Bld 201 (H) 70 - 99 mg/dL   BUN 9 6 - 23 mg/dL   Creatinine, Ser 0.90 0.50 - 1.35 mg/dL   Calcium 8.9 8.4 - 10.5 mg/dL   Total Protein 6.6 6.0 - 8.3 g/dL    Albumin 3.3 (L) 3.5 - 5.2 g/dL   AST 21 0 - 37 U/L   ALT 18 0 - 53 U/L   Alkaline Phosphatase 45 39 - 117 U/L   Total Bilirubin 0.8 0.3 - 1.2 mg/dL   GFR calc non Af Amer >90 >90 mL/min   GFR calc Af Amer >90 >90 mL/min   Anion gap 6 5 - 15  Lipase, blood  Result Value Ref Range   Lipase 20 11 - 59 U/L  CBC with Differential  Result Value Ref Range   WBC 7.9 4.0 - 10.5 K/uL   RBC 4.37 4.22 - 5.81 MIL/uL   Hemoglobin 14.0 13.0 - 17.0 g/dL   HCT 39.4 39.0 - 52.0 %   MCV 90.2 78.0 - 100.0 fL   MCH 32.0 26.0 - 34.0 pg   MCHC 35.5 30.0 - 36.0 g/dL   RDW 12.9 11.5 - 15.5 %   Platelets 157 150 -  400 K/uL   Neutrophils Relative % 73 43 - 77 %   Neutro Abs 5.8 1.7 - 7.7 K/uL   Lymphocytes Relative 21 12 - 46 %   Lymphs Abs 1.6 0.7 - 4.0 K/uL   Monocytes Relative 6 3 - 12 %   Monocytes Absolute 0.5 0.1 - 1.0 K/uL   Eosinophils Relative 0 0 - 5 %   Eosinophils Absolute 0.0 0.0 - 0.7 K/uL   Basophils Relative 0 0 - 1 %   Basophils Absolute 0.0 0.0 - 0.1 K/uL   Ct Abdomen Pelvis W Contrast 11/21/2014   CLINICAL DATA:  Epigastric pain, nausea, abnormal abdominal x-ray  EXAM: CT ABDOMEN AND PELVIS WITH CONTRAST  TECHNIQUE: Multidetector CT imaging of the abdomen and pelvis was performed using the standard protocol following bolus administration of intravenous contrast.  CONTRAST:  45mL OMNIPAQUE IOHEXOL 300 MG/ML SOLN, 126mL OMNIPAQUE IOHEXOL 300 MG/ML SOLN  COMPARISON:  11/21/2014 and 03/06/2014  FINDINGS: Lung bases are unremarkable. Moderate degenerative changes lumbar spine. Moderate disc space flattening with endplate sclerotic changes and large anterior osteophytes at L4-L5 level.  Enhanced liver is unremarkable. No calcified gallstones are noted within gallbladder. The pancreas, spleen and adrenal glands are unremarkable. Enhanced kidneys are symmetrical in size. No hydronephrosis or hydroureter. Again noted bilateral duplicated renal collecting system.  No aortic aneurysm.  No small bowel  obstruction. No ascites or free air. No adenopathy. Moderate stool noted in right colon and transverse colon. No pericecal inflammation. Normal appendix. Moderate stool noted in descending colon. Moderate to abundant stool noted in sigmoid colon proximally. Distal sigmoid colon and rectum are empty.  Prostate gland and seminal vesicles are unremarkable. Moderate distended urinary bladder. There is a right inguinal canal hernia containing fat without evidence of acute complication.  IMPRESSION: 1. No acute inflammatory process within abdomen. 2. Moderate stool throughout the colon. 3. No pericecal inflammation.  Normal appendix. 4. No small bowel obstruction. 5. Again noted bilateral duplicated renal collecting system. 6. Moderate distended urinary bladder. 7. No hydronephrosis or hydroureter. Bilateral renal symmetrical excretion.   Electronically Signed   By: Lahoma Crocker M.D.   On: 11/21/2014 20:06   Dg Abd Acute W/chest 11/21/2014   CLINICAL DATA:  Epigastric pain  EXAM: DG ABDOMEN ACUTE W/ 1V CHEST  COMPARISON:  October 23, 2014  FINDINGS: PA chest: Lungs are clear. Heart size and pulmonary vascularity are normal. No adenopathy.  Supine and upright abdomen: There is fairly diffuse stool throughout the colon. There is no bowel dilatation. There are scattered air-fluid levels throughout the abdomen. No free air. There are small phleboliths in the pelvis.  IMPRESSION: Scattered air-fluid levels without bowel dilatation. Question early ileus or enteritis. Obstruction is not felt to be likely. There is diffuse stool throughout the colon. Lungs are clear.   Electronically Signed   By: Lowella Grip III M.D.   On: 11/21/2014 17:18    2020:  Workup reassuring. Feels better and wants to go home now. Has tol PO well while in the ED without N/V. T/C to GI Dr. Oneida Alar, case discussed, including:  HPI, pertinent PM/SHx, VS/PE, dx testing, ED course and treatment:  Requests to have pt start PO pepcid BID x2 weeks,  continue his PPI, call ofc in morning to obtain f/u appt to further look into tx for his Hpylori. Dx and testing, as well as d/w GI MD, d/w pt and family.  Questions answered.  Verb understanding, agreeable to d/c home with outpt f/u.  Francine Graven, DO 11/23/14 1704

## 2014-11-21 NOTE — ED Notes (Signed)
Pain to epigastric pain, rates pain 8/10.  Took nausea medication and medication for reflux this am, with mild relief.

## 2014-11-22 ENCOUNTER — Encounter (HOSPITAL_COMMUNITY): Payer: Self-pay | Admitting: *Deleted

## 2014-11-22 ENCOUNTER — Telehealth: Payer: Self-pay | Admitting: Gastroenterology

## 2014-11-22 ENCOUNTER — Emergency Department (HOSPITAL_COMMUNITY)
Admission: EM | Admit: 2014-11-22 | Discharge: 2014-11-22 | Disposition: A | Discharge status: Home / Self Care | Payer: Medicaid Other | Attending: Emergency Medicine | Admitting: Emergency Medicine

## 2014-11-22 DIAGNOSIS — R112 Nausea with vomiting, unspecified: Secondary | ICD-10-CM | POA: Diagnosis not present

## 2014-11-22 DIAGNOSIS — R1013 Epigastric pain: Secondary | ICD-10-CM | POA: Insufficient documentation

## 2014-11-22 DIAGNOSIS — Z794 Long term (current) use of insulin: Secondary | ICD-10-CM | POA: Diagnosis not present

## 2014-11-22 DIAGNOSIS — K219 Gastro-esophageal reflux disease without esophagitis: Secondary | ICD-10-CM | POA: Diagnosis not present

## 2014-11-22 DIAGNOSIS — Z880 Allergy status to penicillin: Secondary | ICD-10-CM | POA: Insufficient documentation

## 2014-11-22 DIAGNOSIS — H409 Unspecified glaucoma: Secondary | ICD-10-CM | POA: Diagnosis not present

## 2014-11-22 DIAGNOSIS — E119 Type 2 diabetes mellitus without complications: Secondary | ICD-10-CM | POA: Insufficient documentation

## 2014-11-22 DIAGNOSIS — Z79899 Other long term (current) drug therapy: Secondary | ICD-10-CM | POA: Diagnosis not present

## 2014-11-22 DIAGNOSIS — R1012 Left upper quadrant pain: Secondary | ICD-10-CM | POA: Diagnosis present

## 2014-11-22 DIAGNOSIS — R197 Diarrhea, unspecified: Secondary | ICD-10-CM | POA: Diagnosis not present

## 2014-11-22 LAB — CBC WITH DIFFERENTIAL/PLATELET
Basophils Absolute: 0 10*3/uL (ref 0.0–0.1)
Basophils Relative: 0 % (ref 0–1)
Eosinophils Absolute: 0 10*3/uL (ref 0.0–0.7)
Eosinophils Relative: 0 % (ref 0–5)
HCT: 43 % (ref 39.0–52.0)
Hemoglobin: 15.4 g/dL (ref 13.0–17.0)
Lymphocytes Relative: 21 % (ref 12–46)
Lymphs Abs: 1.8 10*3/uL (ref 0.7–4.0)
MCH: 32 pg (ref 26.0–34.0)
MCHC: 35.8 g/dL (ref 30.0–36.0)
MCV: 89.4 fL (ref 78.0–100.0)
Monocytes Absolute: 0.5 10*3/uL (ref 0.1–1.0)
Monocytes Relative: 6 % (ref 3–12)
Neutro Abs: 6.1 10*3/uL (ref 1.7–7.7)
Neutrophils Relative %: 73 % (ref 43–77)
Platelets: 184 10*3/uL (ref 150–400)
RBC: 4.81 MIL/uL (ref 4.22–5.81)
RDW: 12.8 % (ref 11.5–15.5)
WBC: 8.4 10*3/uL (ref 4.0–10.5)

## 2014-11-22 LAB — COMPREHENSIVE METABOLIC PANEL
ALT: 16 U/L (ref 0–53)
AST: 23 U/L (ref 0–37)
Albumin: 3.6 g/dL (ref 3.5–5.2)
Alkaline Phosphatase: 51 U/L (ref 39–117)
Anion gap: 11 (ref 5–15)
BUN: 8 mg/dL (ref 6–23)
CO2: 23 mmol/L (ref 19–32)
Calcium: 9.1 mg/dL (ref 8.4–10.5)
Chloride: 100 mmol/L (ref 96–112)
Creatinine, Ser: 1.03 mg/dL (ref 0.50–1.35)
GFR calc Af Amer: >90 mL/min (ref >90)
GFR calc non Af Amer: 87 mL/min — ABNORMAL LOW (ref >90)
Glucose, Bld: 303 mg/dL — ABNORMAL HIGH (ref 70–99)
Potassium: 4.7 mmol/L (ref 3.5–5.1)
Sodium: 134 mmol/L — ABNORMAL LOW (ref 135–145)
Total Bilirubin: 1.3 mg/dL — ABNORMAL HIGH (ref 0.3–1.2)
Total Protein: 7.1 g/dL (ref 6.0–8.3)

## 2014-11-22 LAB — LIPASE, BLOOD: Lipase: 21 U/L (ref 11–59)

## 2014-11-22 MED ORDER — DIAZEPAM 5 MG/ML IJ SOLN
5.0000 mg | Freq: Once | INTRAMUSCULAR | Status: AC
  Administered 2014-11-22: 5 mg via INTRAVENOUS
  Filled 2014-11-22: qty 2

## 2014-11-22 MED ORDER — SODIUM CHLORIDE 0.9 % IV BOLUS (SEPSIS)
1000.0000 mL | Freq: Once | INTRAVENOUS | Status: AC
  Administered 2014-11-22: 1000 mL via INTRAVENOUS

## 2014-11-22 MED ORDER — HYDROMORPHONE HCL 1 MG/ML IJ SOLN
1.0000 mg | Freq: Once | INTRAMUSCULAR | Status: AC
  Administered 2014-11-22: 1 mg via INTRAVENOUS
  Filled 2014-11-22: qty 1

## 2014-11-22 MED ORDER — FAMOTIDINE IN NACL 20-0.9 MG/50ML-% IV SOLN
20.0000 mg | Freq: Once | INTRAVENOUS | Status: AC
  Administered 2014-11-22: 20 mg via INTRAVENOUS
  Filled 2014-11-22: qty 50

## 2014-11-22 MED ORDER — METOCLOPRAMIDE HCL 5 MG/ML IJ SOLN
10.0000 mg | Freq: Once | INTRAMUSCULAR | Status: AC
  Administered 2014-11-22: 10 mg via INTRAVENOUS
  Filled 2014-11-22: qty 2

## 2014-11-22 NOTE — Discharge Instructions (Signed)

## 2014-11-22 NOTE — ED Provider Notes (Signed)
CSN: [id number]     Arrival date & time 11/22/14  [id number] History  This chart was scribed for Virgel Manifold, MD by Mercy Moore, ED scribe.  This patient was seen in room [facility]04/[facility]04 and the patient's care was started at 8:56 AM.   Chief Complaint  Patient presents with  . Abdominal Pain   HPI HPI Comments: Timothy Clark is a 44 y.o. male with PMHx of GERD, esophagitis, and hiccups who presents to the Emergency Department complaining of unresolved LUQ pain. Patient states that his epigastric pain makes him feel "full." Patient endorses taking his medication this morning, but states "I threw it up." Patient reports four episodes of vomiting this morning; he denies hemoptysis. Patient states his pain is consistent with pain described at evaluation from last night. Patient reports addition of diarrhea to his list of complaints; new since last night. Per chart history patient with history of Timothy Clark and prescription for Protonix one month ago.   Past Medical History  Diagnosis Date  . Diabetes mellitus without complication     diagnosed around age 21  . Glaucoma   . Eye hemorrhage   . Gastritis   . GERD (gastroesophageal reflux disease)   . Esophagitis   . Hiccups    Past Surgical History  Procedure Laterality Date  . Eye surgery    . Esophagogastroduodenoscopy  2015    Dr. Britta Mccreedy  . Esophagogastroduodenoscopy N/A 10/26/2014    Procedure: ESOPHAGOGASTRODUODENOSCOPY (EGD);  Surgeon: Daneil Dolin, MD;  Location: [facility];  Service: Endoscopy;  Laterality: N/A;   Family History  Problem Relation Age of Onset  . Ovarian cancer Mother   . Cervical cancer Sister   . Diabetes Sister   . Colon cancer Neg Hx    History  Substance Use Topics  . Smoking status: Never Smoker   . Smokeless tobacco: Not on file  . Alcohol Use: No    Review of Systems  Constitutional: Positive for chills. Negative for fever.  HENT: Negative for congestion.   Respiratory: Negative for cough.    Cardiovascular: Negative for chest pain.  Gastrointestinal: Positive for nausea, vomiting, abdominal pain and diarrhea.  Genitourinary: Negative for dysuria.  Musculoskeletal: Negative for gait problem.  Skin: Negative for rash.  Neurological: Negative for headaches.  Psychiatric/Behavioral: Negative for confusion.  All other systems reviewed and are negative.   Allergies  Aspirin; Doxycycline; Penicillins; Phenergan; and Zofran  Home Medications   Prior to Admission medications   Medication Sig Start Date End Date Taking? Authorizing Provider  acetaZOLAMIDE (DIAMOX) 250 MG tablet Take 250 mg by mouth every 6 (six) hours.    Historical Provider, MD  alum & mag hydroxide-simeth (MAALOX/MYLANTA) 200-200-20 MG/5ML suspension Take 30 mLs by mouth every 6 (six) hours as needed for indigestion or heartburn.    Historical Provider, MD  brimonidine (ALPHAGAN) 0.2 % ophthalmic solution Place 1 drop into the left eye 2 (two) times daily.    Historical Provider, MD  brimonidine-timolol (COMBIGAN) 0.2-0.5 % ophthalmic solution Place 1 drop into both eyes at bedtime.    Historical Provider, MD  Dapagliflozin Propanediol (FARXIGA) 10 MG TABS Take 10 mg by mouth daily.    Historical Provider, MD  Dexlansoprazole 30 MG capsule Take 30 mg by mouth daily.    Historical Provider, MD  gabapentin (NEURONTIN) 600 MG tablet Take 600 mg by mouth daily as needed (for neuropathy).     Historical Provider, MD  hydroxypropyl methylcellulose / hypromellose (ISOPTO TEARS / GONIOVISC)  2.5 % ophthalmic solution Place 1 drop into both eyes as needed for dry eyes.    Historical Provider, MD  insulin detemir (LEVEMIR) 100 UNIT/ML injection Inject 30 Units into the skin at bedtime.    Historical Provider, MD  Insulin Isophane & Regular Human (HUMULIN 70/30 KWIKPEN) (70-30) 100 UNIT/ML PEN Inject 35 Units into the skin 3 (three) times daily.     Historical Provider, MD  lisinopril (PRINIVIL,ZESTRIL) 10 MG tablet Take 10 mg  by mouth daily.    Historical Provider, MD  pantoprazole (PROTONIX) 40 MG tablet Take 1 tablet (40 mg total) by mouth 2 (two) times daily. Patient not taking: Reported on 11/21/2014 10/29/14   Samuella Cota, MD  prochlorperazine (COMPAZINE) 10 MG tablet Take 1 tablet (10 mg total) by mouth every 6 (six) hours as needed for nausea or vomiting. 10/29/14   Samuella Cota, MD  timolol (TIMOPTIC) 0.5 % ophthalmic solution Place 1 drop into the left eye 2 (two) times daily.    Historical Provider, MD   Triage Vitals: BP 184/96 mmHg  Pulse 99  Temp(Src) 97.8 F (36.6 C) (Oral)  Resp 24  Ht 6' (1.829 m)  Wt 240 lb (108.863 kg)  BMI 32.54 kg/m2  SpO2 100% Physical Exam  Constitutional: He is oriented to person, place, and time. He appears well-developed and well-nourished. No distress.  HENT:  Head: Normocephalic and atraumatic.  Eyes: EOM are normal.  Neck: Neck supple. No tracheal deviation present.  Cardiovascular: Normal rate.   Pulmonary/Chest: Effort normal. No respiratory distress.  Abdominal:  Epigastric tenderness. No rebound or guarding. Actively retching.   Musculoskeletal: Normal range of motion.  Neurological: He is alert and oriented to person, place, and time.  Skin: Skin is warm and dry.  Psychiatric: He has a normal mood and affect. His behavior is normal.  Nursing note and vitals reviewed.   ED Course  Procedures (including critical care time)  COORDINATION OF CARE: 8:56 AM- Discussed treatment plan with patient at bedside and patient agreed to plan.   Labs Review Labs Reviewed - No data to display  Imaging Review Ct Abdomen Pelvis W Contrast  11/21/2014   CLINICAL DATA:  Epigastric pain, nausea, abnormal abdominal x-ray  EXAM: CT ABDOMEN AND PELVIS WITH CONTRAST  TECHNIQUE: Multidetector CT imaging of the abdomen and pelvis was performed using the standard protocol following bolus administration of intravenous contrast.  CONTRAST:  28mL OMNIPAQUE IOHEXOL 300  MG/ML SOLN, 165mL OMNIPAQUE IOHEXOL 300 MG/ML SOLN  COMPARISON:  11/21/2014 and 03/06/2014  FINDINGS: Lung bases are unremarkable. Moderate degenerative changes lumbar spine. Moderate disc space flattening with endplate sclerotic changes and large anterior osteophytes at L4-L5 level.  Enhanced liver is unremarkable. No calcified gallstones are noted within gallbladder. The pancreas, spleen and adrenal glands are unremarkable. Enhanced kidneys are symmetrical in size. No hydronephrosis or hydroureter. Again noted bilateral duplicated renal collecting system.  No aortic aneurysm.  No small bowel obstruction. No ascites or free air. No adenopathy. Moderate stool noted in right colon and transverse colon. No pericecal inflammation. Normal appendix. Moderate stool noted in descending colon. Moderate to abundant stool noted in sigmoid colon proximally. Distal sigmoid colon and rectum are empty.  Prostate gland and seminal vesicles are unremarkable. Moderate distended urinary bladder. There is a right inguinal canal hernia containing fat without evidence of acute complication.  IMPRESSION: 1. No acute inflammatory process within abdomen. 2. Moderate stool throughout the colon. 3. No pericecal inflammation.  Normal appendix. 4. No small  bowel obstruction. 5. Again noted bilateral duplicated renal collecting system. 6. Moderate distended urinary bladder. 7. No hydronephrosis or hydroureter. Bilateral renal symmetrical excretion.   Electronically Signed   By: Lahoma Crocker M.D.   On: 11/21/2014 20:06   Dg Abd Acute W/chest  11/21/2014   CLINICAL DATA:  Epigastric pain  EXAM: DG ABDOMEN ACUTE W/ 1V CHEST  COMPARISON:  October 23, 2014  FINDINGS: PA chest: Lungs are clear. Heart size and pulmonary vascularity are normal. No adenopathy.  Supine and upright abdomen: There is fairly diffuse stool throughout the colon. There is no bowel dilatation. There are scattered air-fluid levels throughout the abdomen. No free air. There are  small phleboliths in the pelvis.  IMPRESSION: Scattered air-fluid levels without bowel dilatation. Question early ileus or enteritis. Obstruction is not felt to be likely. There is diffuse stool throughout the colon. Lungs are clear.   Electronically Signed   By: Lowella Grip III M.D.   On: 11/21/2014 17:18     EKG Interpretation None      MDM   Final diagnoses:  Epigastric pain    44 year male with epigastric pain. Mild tenderness on exam without peritoneal signs. Patient recently admitted for the same. Ended up coming back is Timothy Clark positive. Antibiotic treatment not initiated because of drug allergies. He is referred to infectious disease. Has not followed up with gastroenterology since his hospitalization because he has "felt better." He now feels much better in the emergency room after symptomatically treatment. His symptoms are consistent with his prior ones. Low suspicion for emergent process. Patient instructed with the need to take his medicines as prescribed, to follow-up with gastroenterology and infectious disease as previously recommended.  Virgel Manifold, MD 11/27/14 1415

## 2014-11-22 NOTE — Telephone Encounter (Signed)
PT SEEN IN ED FOR EPIGASTRIC PAIN. ED THOUGHT PT NEEDED TO BE SEEN PRIOR TO MAY 2016.

## 2014-11-22 NOTE — Telephone Encounter (Signed)
CALLED PATIENT AND HE IS COMING IN 11/27/14

## 2014-11-22 NOTE — ED Notes (Signed)
Pt was discharged from here last night. Pt was seen for same symptoms. Pt is having LUQ pain Pt has throw up 4-5 times since discharge. Pt denies diarrhea.

## 2014-11-23 NOTE — Telephone Encounter (Signed)
Let's see if we can get him in next week

## 2014-11-23 NOTE — Telephone Encounter (Signed)
Patient coming in 11/27/14

## 2014-11-27 ENCOUNTER — Encounter: Payer: Self-pay | Admitting: Nurse Practitioner

## 2014-11-27 ENCOUNTER — Ambulatory Visit (INDEPENDENT_AMBULATORY_CARE_PROVIDER_SITE_OTHER): Payer: PRIVATE HEALTH INSURANCE | Admitting: Nurse Practitioner

## 2014-11-27 VITALS — BP 128/75 | HR 85 | Temp 97.6°F | Ht 72.0 in | Wt 251.8 lb

## 2014-11-27 DIAGNOSIS — B9681 Helicobacter pylori [H. pylori] as the cause of diseases classified elsewhere: Secondary | ICD-10-CM

## 2014-11-27 DIAGNOSIS — A048 Other specified bacterial intestinal infections: Secondary | ICD-10-CM | POA: Insufficient documentation

## 2014-11-27 MED ORDER — CLARITHROMYCIN 250 MG PO TABS: 500.0000 mg | ORAL_TABLET | Freq: Two times a day (BID) | ORAL | Status: DC

## 2014-11-27 MED ORDER — METRONIDAZOLE 500 MG PO TABS: 500.0000 mg | ORAL_TABLET | Freq: Two times a day (BID) | ORAL | Status: DC

## 2014-11-27 MED ORDER — PANTOPRAZOLE SODIUM 40 MG PO TBEC: 40.0000 mg | DELAYED_RELEASE_TABLET | Freq: Two times a day (BID) | ORAL | Status: DC

## 2014-11-27 NOTE — Progress Notes (Signed)
cc'ed to pcp °

## 2014-11-27 NOTE — Progress Notes (Signed)
Referring Provider: Tawni Carnes, PA-C Primary Care Physician:  Jeri Modena Primary GI:  Dr. Gala Romney  Chief Complaint  Patient presents with  . Follow-up    HPI:   44 year old male presents for follow-up of hiccups and epigastric pain as well as H. pylori gastritis. Is hospitalized for these symptoms on 10/25/2014 and endoscopy on 10/26/2014 showed distal esophagitis likely reflux related although pill-induced injury cannot be excluded, if easily abnormal gastric mucosa of uncertain significance, focal area of excoriation in the cardia most consistent with trauma of heaving. Pathology of stomach biopsy showed healed Dr. Albertine Grates gastritis. Treatment has been difficult for this patient due to allergies to penicillin as well as doxycycline and subsequently tetracycline. Has been to the emergency room 3 times since his hospitalization all for epigastric pain. Was set up with infectious disease for treatment recommendation of H. pylori with multiple allergies however the patient declined a follow-up. Since that recommendation was made the patient is since made an appointment with infectious disease for May but he does not think he can wait this long. Next  Today he states his persistent symptoms are hiccups, epigastric pain, nausea and vomiting. Was in the ER and admission overnight, discharged again today again for similar symptoms. Is currently taking Dexillant. Allergies to Doxycycline (and subsequently Tetracycline) as well as Penicillins. Denies having tried macrolide antibiotics (specifically Clarithromycin) and Flagyl. Has had occasional low grade fever with the last instance 3 days ago, no fever here today. Denies unintentional weight loss. Denies hematochezia and melena. Denies any other upper or lower GI symptoms.   Past Medical History  Diagnosis Date  . Diabetes mellitus without complication     diagnosed around age 23  . Glaucoma   . Eye hemorrhage   . Gastritis   . GERD  (gastroesophageal reflux disease)   . Esophagitis   . Hiccups     Past Surgical History  Procedure Laterality Date  . Eye surgery    . Esophagogastroduodenoscopy  2015    Dr. Britta Mccreedy  . Esophagogastroduodenoscopy N/A 10/26/2014    RMR: Distal esophagititis-likely reflux related although an element of pill induced injuruy no exclueded  status post biopsy. Diffusely abnormal gastric mucosa of uncertain signigicane -status post gastric biopsy. Focal area of excoriation in the cardia most consistant with a trauma of heaving.     Current Outpatient Prescriptions  Medication Sig Dispense Refill  . acetaZOLAMIDE (DIAMOX) 250 MG tablet Take 250 mg by mouth every 6 (six) hours.    [name] alum & mag hydroxide-simeth (MAALOX/MYLANTA) 200-200-20 MG/5ML suspension Take 30 mLs by mouth every 6 (six) hours as needed for indigestion or heartburn.    . brimonidine (ALPHAGAN) 0.2 % ophthalmic solution Place 1 drop into the left eye 2 (two) times daily.    . brimonidine-timolol (COMBIGAN) 0.2-0.5 % ophthalmic solution Place 1 drop into both eyes at bedtime.    . Dapagliflozin Propanediol (FARXIGA) 10 MG TABS Take 10 mg by mouth daily.    [name] Dexlansoprazole 30 MG capsule Take 30 mg by mouth daily.    [name] gabapentin (NEURONTIN) 600 MG tablet Take 600 mg by mouth daily as needed (for neuropathy).     . hydroxypropyl methylcellulose / hypromellose (ISOPTO TEARS / GONIOVISC) 2.5 % ophthalmic solution Place 1 drop into both eyes as needed for dry eyes.    . insulin detemir (LEVEMIR) 100 UNIT/ML injection Inject 30 Units into the skin at bedtime.    . Insulin Isophane & Regular Human (HUMULIN 70/30 KWIKPEN) (70-30)  100 UNIT/ML PEN Inject 35 Units into the skin 3 (three) times daily.     [name] lisinopril (PRINIVIL,ZESTRIL) 10 MG tablet Take 10 mg by mouth daily.    . pantoprazole (PROTONIX) 40 MG tablet Take 1 tablet (40 mg total) by mouth 2 (two) times daily. 60 tablet 0  . prochlorperazine (COMPAZINE) 10 MG tablet Take 1  tablet (10 mg total) by mouth every 6 (six) hours as needed for nausea or vomiting. 30 tablet 0  . timolol (TIMOPTIC) 0.5 % ophthalmic solution Place 1 drop into the left eye 2 (two) times daily.     No current facility-administered medications for this visit.    Allergies as of 11/27/2014 - Review Complete 11/22/2014  Allergen Reaction Noted  . Aspirin Hives and Itching 03/22/2009  . Doxycycline Hives and Itching 03/20/2014  . Penicillins Hives and Itching 03/22/2009  . Phenergan [promethazine hcl] Nausea And Vomiting 10/27/2014  . Zofran [ondansetron hcl] Other (See Comments) 10/27/2014    Family History  Problem Relation Age of Onset  . Ovarian cancer Mother   . Cervical cancer Sister   . Diabetes Sister   . Colon cancer Neg Hx     History   Social History  . Marital Status: Single    Spouse Name: N/A  . Number of Children: N/A  . Years of Education: N/A   Social History Main Topics  . Smoking status: Never Smoker   . Smokeless tobacco: Not on file  . Alcohol Use: No  . Drug Use: No  . Sexual Activity: Not on file   Other Topics Concern  . None   Social History Narrative    Review of Systems: General: Negative for anorexia, weight loss, fatigue, weakness. Eyes: Negative for vision changes.  ENT: Negative for hoarseness, difficulty swallowing. CV: Negative for chest pain, angina, palpitations, peripheral edema.  Respiratory: Negative for dyspnea at rest, cough, sputum, wheezing.  GI: See history of present illness. MS: Negative for joint pain, low back pain.  Derm: Negative for rash or itching.  Neuro: Negative for weakness, abnormal sensation, seizure, frequent headaches, memory loss, confusion.  Psych: Negative for anxiety, depression, suicidal ideation, hallucinations.  Endo: Negative for unusual weight change.  Heme: Negative for bruising or bleeding. Allergy: Negative for rash or hives.   Physical Exam: BP 128/75 mmHg  Pulse 85  Temp(Src) 97.6 F  (36.4 C) (Oral)  Ht 6' (1.829 m)  Wt 251 lb 12.8 oz (114.216 kg)  BMI 34.14 kg/m2 General:   Alert and oriented. No distress noted. Pleasant and cooperative.  Head:  Normocephalic and atraumatic. Mouth:  Oral mucosa pink and moist. Good dentition. No lesions. Neck:  Supple, without mass or thyromegaly. Lungs:  Clear to auscultation bilaterally. No wheezes, rales, or rhonchi. No distress.  Heart:  S1, S2 present without murmurs, rubs, or gallops. Regular rate and rhythm. Abdomen:  +BS, soft, and non-distended. Mild epigastric TTP noted. No rebound or guarding. No HSM or masses noted. Extremities:  Without edema. Neurologic:  Alert and  oriented x4;  grossly normal neurologically. Skin:  Intact without significant lesions or rashes. Psych:  Alert and cooperative. Normal mood and affect.    11/27/2014 2:14 PM

## 2014-11-27 NOTE — Assessment & Plan Note (Signed)
44 year old male presents for follow-up in H pylori infection. The patient was admitted to [facility] for epigastric pain, nausea, vomiting, and persistent hiccups. Endoscopy during his admission demonstrated H. pylori per gastric biopsy. The patient was referred to infectious disease because he has multiple antibiotic allergies. Reviewed his allergies with him and he is allergic to all penicillins as well as doxycycline (and subsequently tetracycline). Since admission the patient has returned to the emergency room in minimum of 3 times, twice to [facility] and once to [place]. This is resulted in overnight admission at least 2 of those visits. His last admission was in [facility] in which she was discharged today. At some point he was changed from Protonix to [place]. He declined to follow-up and infectious disease until sometime after his initial admission and diagnosis. He currently has an infectious disease for his scheduled for 9 days from now, however giving his persistent symptoms, his statement that he is not sure if he can wait that long, and his repeated ER visits it may be prudent to attempt an initial regimen.   Review of evidence based literature demonstrates and effective regimen of Protonix 40 mg twice a day, clarithromycin 500 mg twice a day (as a substitute for amoxicillin), and Flagyl 500 mg twice a day. This regimen can be prescribed for 7 days and he does not have a known allergy to any of these agents. We will move forward with this regimen, keeping Protonix indefinitely until his follow-up appointment in 3 months. Keep your infectious disease appointment as they can review the antibiotic choices make any changes as necessary. Return for follow-up in 3 months to reevaluate and consider testing for eradication. Stop Dexilant at this point as Protonix twice a day will be substituted per evidence based guidelines

## 2014-11-27 NOTE — Patient Instructions (Signed)
1. Stop taking Dexilant. 2. Start Protonix 40 mg twice a day. I have called in a prescription of this to your pharmacy. 3. Take the following 2 antibiotics together: Clarithromycin 500 mg twice a day, Flagyl 500 mg twice a day. Take both of these for a total of 7 days. I send him prescriptions to your pharmacy. 4. Procedure point with infectious disease today review these antibiotic choices and make any changes or recommendations as necessary.  5. Return for follow-up in 3 months first evaluate your progress.

## 2014-12-06 ENCOUNTER — Encounter: Payer: Self-pay | Admitting: Infectious Diseases

## 2014-12-06 ENCOUNTER — Ambulatory Visit (INDEPENDENT_AMBULATORY_CARE_PROVIDER_SITE_OTHER): Payer: Medicaid Other | Admitting: Infectious Diseases

## 2014-12-06 VITALS — BP 170/105 | HR 101 | Temp 98.3°F | Ht 72.0 in | Wt 256.0 lb

## 2014-12-06 DIAGNOSIS — E118 Type 2 diabetes mellitus with unspecified complications: Secondary | ICD-10-CM | POA: Diagnosis not present

## 2014-12-06 DIAGNOSIS — E1165 Type 2 diabetes mellitus with hyperglycemia: Secondary | ICD-10-CM

## 2014-12-06 DIAGNOSIS — B9681 Helicobacter pylori [H. pylori] as the cause of diseases classified elsewhere: Secondary | ICD-10-CM

## 2014-12-06 DIAGNOSIS — I10 Essential (primary) hypertension: Secondary | ICD-10-CM | POA: Insufficient documentation

## 2014-12-06 DIAGNOSIS — A048 Other specified bacterial intestinal infections: Secondary | ICD-10-CM

## 2014-12-06 DIAGNOSIS — IMO0002 Reserved for concepts with insufficient information to code with codable children: Secondary | ICD-10-CM

## 2014-12-06 MED ORDER — CLARITHROMYCIN 250 MG PO TABS: 500.0000 mg | ORAL_TABLET | Freq: Two times a day (BID) | ORAL | Status: DC

## 2014-12-06 MED ORDER — METRONIDAZOLE 500 MG PO TABS
500.0000 mg | ORAL_TABLET | Freq: Two times a day (BID) | ORAL | Status: DC
Start: 2014-12-06 — End: 2014-12-14

## 2014-12-06 NOTE — Assessment & Plan Note (Signed)
He will f/u with his PCP

## 2014-12-06 NOTE — Progress Notes (Signed)
Noted;  Needs followup w EG in several weeks ; need breath testing vs. stool antigen testing

## 2014-12-06 NOTE — Progress Notes (Signed)
   Subjective:    Patient ID: Timothy Clark, male    DOB: 07-23-1971, 44 y.o.   MRN: [id number]  HPI 44 yo M with hx DM2 with L eye blindness and neuropathy, H. pylori gastritis. He was hospitalized 10/25/2014 and endoscopy on 10/26/2014 showed distal esophagitis likely reflux related although pill-induced injury cannot be excluded, abnormal gastric mucosa of uncertain significance, focal area of (xxx) in the cardia most consistent with trauma of heaving. Pathology of stomach biopsy showed healed H pylori gastritis. Treatment has been complicated by allergies to penicillin as well as tetracycline/doxy. Has been to the emergency room multiple times since his hospitalization all for epigastric pain.  He was seen by his GI provider on 4-18 and changed to PPI, biaxin, flagyl for 7 days. He completed that rx this AM without difficulty.   He has had slight discomfort in his stomach still. Unchanged with eating. Is rare, lasts 2-3 seconds. Goes away with carafate.  Has been eating well, wt has been increasing since on DM rx. Is walking 5 miles/day.  BP is notably high today- had lisinopril increased on 4-25. Has PCP f/u in 1 week.   PMHx, Soc, Fhx reviewed.   Review of Systems  Constitutional: Negative for fever, chills, appetite change and unexpected weight change.  Eyes: Positive for visual disturbance.  Respiratory: Negative for shortness of breath.   Cardiovascular: Negative for chest pain.  Gastrointestinal: Negative for diarrhea and constipation.  Genitourinary: Negative for difficulty urinating.  Neurological: Positive for numbness. Negative for headaches.  has LE edema, worse at night.  saw ophtho end of January. F/u in May FSG 120-145     Objective:   Physical Exam  Constitutional: He appears well-developed and well-nourished.  HENT:  Mouth/Throat: Dental caries present. No oropharyngeal exudate.  Eyes: EOM are normal. Pupils are equal, round, and reactive to light.  Neck: Neck  supple.  Cardiovascular: Normal rate, regular rhythm and normal heart sounds.   Pulmonary/Chest: Effort normal and breath sounds normal.  Abdominal: Soft. Bowel sounds are normal. There is no tenderness. There is no rebound.  Musculoskeletal: He exhibits edema.  Lymphadenopathy:    He has no cervical adenopathy.          Assessment & Plan:

## 2014-12-06 NOTE — Assessment & Plan Note (Signed)
His rx has been changed, he will f/u with his PCP.

## 2014-12-06 NOTE — Assessment & Plan Note (Addendum)
He has completed 1 week of rx and tolerated his current anbx without difficulty.  By guidelines, 2 weeks of therapy is indicated. Will refill his rx for an additional week.  Would consider repeat endoscopy for test of cure, or current protocol of his GI practitioner.  Will see him back prn

## 2014-12-06 NOTE — Addendum Note (Signed)
Addended by: Verbena Boeding C on: 12/06/2014 01:23 PM   Modules accepted: Level of Service

## 2014-12-07 NOTE — Progress Notes (Signed)
HAS APPOINTMENT 02/26/15 WITH EG

## 2014-12-12 ENCOUNTER — Emergency Department (HOSPITAL_COMMUNITY)
Admission: EM | Admit: 2014-12-12 | Discharge: 2014-12-12 | Disposition: A | Discharge status: Home / Self Care | Payer: Medicaid Other | Attending: Emergency Medicine | Admitting: Emergency Medicine

## 2014-12-12 ENCOUNTER — Emergency Department (HOSPITAL_COMMUNITY): Payer: Medicaid Other

## 2014-12-12 ENCOUNTER — Encounter (HOSPITAL_COMMUNITY): Payer: Self-pay

## 2014-12-12 DIAGNOSIS — R112 Nausea with vomiting, unspecified: Secondary | ICD-10-CM | POA: Insufficient documentation

## 2014-12-12 DIAGNOSIS — Z794 Long term (current) use of insulin: Secondary | ICD-10-CM | POA: Diagnosis not present

## 2014-12-12 DIAGNOSIS — E119 Type 2 diabetes mellitus without complications: Secondary | ICD-10-CM | POA: Diagnosis not present

## 2014-12-12 DIAGNOSIS — R1013 Epigastric pain: Secondary | ICD-10-CM | POA: Diagnosis not present

## 2014-12-12 DIAGNOSIS — K219 Gastro-esophageal reflux disease without esophagitis: Secondary | ICD-10-CM | POA: Insufficient documentation

## 2014-12-12 DIAGNOSIS — Z792 Long term (current) use of antibiotics: Secondary | ICD-10-CM | POA: Insufficient documentation

## 2014-12-12 DIAGNOSIS — Z880 Allergy status to penicillin: Secondary | ICD-10-CM | POA: Insufficient documentation

## 2014-12-12 DIAGNOSIS — Z8619 Personal history of other infectious and parasitic diseases: Secondary | ICD-10-CM | POA: Diagnosis not present

## 2014-12-12 DIAGNOSIS — Z79899 Other long term (current) drug therapy: Secondary | ICD-10-CM | POA: Diagnosis not present

## 2014-12-12 DIAGNOSIS — R0602 Shortness of breath: Secondary | ICD-10-CM | POA: Diagnosis present

## 2014-12-12 DIAGNOSIS — Z8669 Personal history of other diseases of the nervous system and sense organs: Secondary | ICD-10-CM | POA: Diagnosis not present

## 2014-12-12 LAB — CBC WITH DIFFERENTIAL/PLATELET
Basophils Absolute: 0 10*3/uL (ref 0.0–0.1)
Basophils Relative: 0 % (ref 0–1)
Eosinophils Absolute: 0 10*3/uL (ref 0.0–0.7)
Eosinophils Relative: 0 % (ref 0–5)
HCT: 40.9 % (ref 39.0–52.0)
Hemoglobin: 14.7 g/dL (ref 13.0–17.0)
Lymphocytes Relative: 21 % (ref 12–46)
Lymphs Abs: 1.9 10*3/uL (ref 0.7–4.0)
MCH: 31.6 pg (ref 26.0–34.0)
MCHC: 35.9 g/dL (ref 30.0–36.0)
MCV: 88 fL (ref 78.0–100.0)
Monocytes Absolute: 0.6 10*3/uL (ref 0.1–1.0)
Monocytes Relative: 7 % (ref 3–12)
Neutro Abs: 6.2 10*3/uL (ref 1.7–7.7)
Neutrophils Relative %: 72 % (ref 43–77)
Platelets: 152 10*3/uL (ref 150–400)
RBC: 4.65 MIL/uL (ref 4.22–5.81)
RDW: 12.2 % (ref 11.5–15.5)
WBC: 8.7 10*3/uL (ref 4.0–10.5)

## 2014-12-12 LAB — COMPREHENSIVE METABOLIC PANEL
ALT: 19 U/L (ref 17–63)
AST: 23 U/L (ref 15–41)
Albumin: 3.8 g/dL (ref 3.5–5.0)
Alkaline Phosphatase: 37 U/L — ABNORMAL LOW (ref 38–126)
Anion gap: 11 (ref 5–15)
BUN: 20 mg/dL (ref 6–20)
CO2: 22 mmol/L (ref 22–32)
Calcium: 9.2 mg/dL (ref 8.9–10.3)
Chloride: 101 mmol/L (ref 101–111)
Creatinine, Ser: 1.11 mg/dL (ref 0.61–1.24)
GFR calc Af Amer: >60 mL/min (ref >60)
GFR calc non Af Amer: >60 mL/min (ref >60)
Glucose, Bld: 251 mg/dL — ABNORMAL HIGH (ref 70–99)
Potassium: 3.7 mmol/L (ref 3.5–5.1)
Sodium: 134 mmol/L — ABNORMAL LOW (ref 135–145)
Total Bilirubin: 1 mg/dL (ref 0.3–1.2)
Total Protein: 7.2 g/dL (ref 6.5–8.1)

## 2014-12-12 LAB — CBG MONITORING, ED: Glucose-Capillary: 212 mg/dL — ABNORMAL HIGH (ref 70–99)

## 2014-12-12 LAB — LIPASE, BLOOD: Lipase: 27 U/L (ref 22–51)

## 2014-12-12 LAB — TROPONIN I: Troponin I: <0.03 ng/mL (ref <0.031)

## 2014-12-12 MED ORDER — ONDANSETRON HCL 4 MG/2ML IJ SOLN
INTRAMUSCULAR | Status: AC
  Administered 2014-12-12: 4 mg
  Filled 2014-12-12: qty 2

## 2014-12-12 MED ORDER — SODIUM CHLORIDE 0.9 % IV BOLUS (SEPSIS)
1000.0000 mL | Freq: Once | INTRAVENOUS | Status: AC
  Administered 2014-12-12: 1000 mL via INTRAVENOUS

## 2014-12-12 MED ORDER — TRAMADOL HCL 50 MG PO TABS: 50.0000 mg | ORAL_TABLET | Freq: Four times a day (QID) | ORAL | Status: DC | PRN

## 2014-12-12 MED ORDER — GI COCKTAIL ~~LOC~~
30.0000 mL | Freq: Once | ORAL | Status: AC
  Administered 2014-12-12: 30 mL via ORAL
  Filled 2014-12-12: qty 30

## 2014-12-12 MED ORDER — HYDROMORPHONE HCL 1 MG/ML IJ SOLN
1.0000 mg | Freq: Once | INTRAMUSCULAR | Status: AC
  Administered 2014-12-12: 1 mg via INTRAVENOUS
  Filled 2014-12-12: qty 1

## 2014-12-12 MED ORDER — METOCLOPRAMIDE HCL 5 MG/ML IJ SOLN
10.0000 mg | Freq: Once | INTRAMUSCULAR | Status: AC
  Administered 2014-12-12: 10 mg via INTRAVENOUS
  Filled 2014-12-12: qty 2

## 2014-12-12 MED ORDER — PANTOPRAZOLE SODIUM 40 MG IV SOLR
40.0000 mg | Freq: Once | INTRAVENOUS | Status: AC
  Administered 2014-12-12: 40 mg via INTRAVENOUS
  Filled 2014-12-12: qty 40

## 2014-12-12 MED ORDER — METOCLOPRAMIDE HCL 10 MG PO TABS: 10.0000 mg | ORAL_TABLET | Freq: Four times a day (QID) | ORAL | Status: DC | PRN

## 2014-12-12 NOTE — ED Notes (Signed)
PT c/o n/v and abd pain since 0630 this morning.  PT says can't breathe well and hurts in chest.  PT vomiting in triage.

## 2014-12-12 NOTE — Discharge Instructions (Signed)

## 2014-12-12 NOTE — ED Notes (Signed)
Patient verbalizes understanding of discharge instructions, prescription medications, home care and follow up care. Patient out of department at this time with family. 

## 2014-12-12 NOTE — ED Provider Notes (Signed)
CSN: [id number]     Arrival date & time 12/12/14  1834 History   First MD Initiated Contact with Patient 12/12/14 1909     Chief Complaint  Patient presents with  . Emesis  . Shortness of Breath     (Consider location/radiation/quality/duration/timing/severity/associated sxs/prior Treatment) HPI   44 year old male with epigastric pain, n/v and hiccups. Several recent evaluations for similar.  Endoscopy during an admission which demonstrated H. pylori per gastric biopsy. Ultimately treated with two week course of azithromycin and flagyl. Reports was feeling better until two days ago when began having epigastric pain and n/v. Improved that evening. Waxing and waning until unbearable today and came to ED. Reports compliance with medications..   Past Medical History  Diagnosis Date  . Type 2 diabetes mellitus with complications     diagnosed around age 68  . Glaucoma   . Eye hemorrhage   . Gastritis   . GERD (gastroesophageal reflux disease)   . Esophagitis   . Hiccups   . Helicobacter pylori (H. pylori) infection    Past Surgical History  Procedure Laterality Date  . Eye surgery    . Esophagogastroduodenoscopy  2015    Dr. Britta Mccreedy  . Esophagogastroduodenoscopy N/A 10/26/2014    RMR: Distal esophagititis-likely reflux related although an element of pill induced injuruy no exclueded  status post biopsy. Diffusely abnormal gastric mucosa of uncertain signigicane -status post gastric biopsy. Focal area of excoriation in the cardia most consistant with a trauma of heaving.    Family History  Problem Relation Age of Onset  . Ovarian cancer Mother   . Cervical cancer Sister   . Diabetes Sister   . Colon cancer Neg Hx    History  Substance Use Topics  . Smoking status: Never Smoker   . Smokeless tobacco: Never Used  . Alcohol Use: No    Review of Systems  All systems reviewed and negative, other than as noted in HPI.   Allergies  Aspirin; Bactrim; Doxycycline; Penicillins;  Phenergan; and Zofran  Home Medications   Prior to Admission medications   Medication Sig Start Date End Date Taking? Authorizing Provider  acetaZOLAMIDE (DIAMOX) 250 MG tablet Take 250 mg by mouth every 6 (six) hours.   Yes Historical Provider, MD  alum & mag hydroxide-simeth (MAALOX/MYLANTA) 200-200-20 MG/5ML suspension Take 30 mLs by mouth every 6 (six) hours as needed for indigestion or heartburn.   Yes Historical Provider, MD  brimonidine-timolol (COMBIGAN) 0.2-0.5 % ophthalmic solution Place 1 drop into both eyes at bedtime.   Yes Historical Provider, MD  clarithromycin (BIAXIN) 250 MG tablet Take 2 tablets (500 mg total) by mouth 2 (two) times daily. 12/06/14  Yes Campbell Riches, MD  Dapagliflozin Propanediol (FARXIGA) 10 MG TABS Take 10 mg by mouth daily.   Yes Historical Provider, MD  gabapentin (NEURONTIN) 600 MG tablet Take 600 mg by mouth daily as needed (for neuropathy).    Yes Historical Provider, MD  hydroxypropyl methylcellulose / hypromellose (ISOPTO TEARS / GONIOVISC) 2.5 % ophthalmic solution Place 1 drop into both eyes as needed for dry eyes.   Yes Historical Provider, MD  insulin aspart protamine- aspart (NOVOLOG MIX 70/30) (70-30) 100 UNIT/ML injection Inject 35 Units into the skin 3 (three) times daily.   Yes Historical Provider, MD  insulin detemir (LEVEMIR) 100 UNIT/ML injection Inject 30 Units into the skin at bedtime.   Yes Historical Provider, MD  lisinopril (PRINIVIL,ZESTRIL) 10 MG tablet Take 10 mg by mouth daily.   Yes Historical  Provider, MD  metroNIDAZOLE (FLAGYL) 500 MG tablet Take 1 tablet (500 mg total) by mouth 2 (two) times daily. 12/06/14  Yes Campbell Riches, MD  ondansetron (ZOFRAN) 4 MG tablet Take 4 mg by mouth every 6 (six) hours as needed for nausea or vomiting.   Yes Historical Provider, MD  pantoprazole (PROTONIX) 40 MG tablet Take 1 tablet (40 mg total) by mouth 2 (two) times daily. 11/27/14  Yes Carlis Stable, NP  potassium chloride SA  (K-DUR,KLOR-CON) 20 MEQ tablet Take 20 mEq by mouth 2 (two) times daily.   Yes Historical Provider, MD  prochlorperazine (COMPAZINE) 10 MG tablet Take 1 tablet (10 mg total) by mouth every 6 (six) hours as needed for nausea or vomiting. 10/29/14  Yes Samuella Cota, MD  sucralfate (CARAFATE) 1 G tablet Take 1 g by mouth 4 (four) times daily.   Yes Historical Provider, MD   There were no vitals taken for this visit. Physical Exam  Constitutional: He appears well-developed and well-nourished. No distress.  HENT:  Head: Normocephalic and atraumatic.  Eyes: Conjunctivae are normal. Right eye exhibits no discharge. Left eye exhibits no discharge.  Neck: Neck supple.  Cardiovascular: Normal rate, regular rhythm and normal heart sounds.  Exam reveals no gallop and no friction rub.   No murmur heard. Pulmonary/Chest: Effort normal and breath sounds normal. No respiratory distress.  Abdominal: Soft. He exhibits no distension. There is tenderness.  Epigastric tenderness w/o rebound or guarding  Musculoskeletal: He exhibits no edema or tenderness.  Neurological: He is alert.  Skin: Skin is warm and dry.  Psychiatric: He has a normal mood and affect. His behavior is normal. Thought content normal.  Nursing note and vitals reviewed.   ED Course  Procedures (including critical care time) Labs Review Labs Reviewed  COMPREHENSIVE METABOLIC PANEL - Abnormal; Notable for the following:    Sodium 134 (*)    Glucose, Bld 251 (*)    Alkaline Phosphatase 37 (*)    All other components within normal limits  CBG MONITORING, ED - Abnormal; Notable for the following:    Glucose-Capillary 212 (*)    All other components within normal limits  CBC WITH DIFFERENTIAL/PLATELET  TROPONIN I  LIPASE, BLOOD    Imaging Review Dg Chest 2 View  12/12/2014   CLINICAL DATA:  Shortness of breath, emesis  EXAM: CHEST  2 VIEW  COMPARISON:  None.  FINDINGS: The heart size and mediastinal contours are within normal  limits. Both lungs are clear. The visualized skeletal structures are unremarkable.  IMPRESSION: No active cardiopulmonary disease.   Electronically Signed   By: Kathreen Devoid   On: 12/12/2014 20:04     EKG Interpretation None      MDM   Final diagnoses:  Abdominal pain, epigastric  Non-intractable vomiting with nausea, vomiting of unspecified type    43yM with continued episodic n/v and abdominal pain. Symptoms currently better. Followed by GI. Hx of diabetes. Consider gastroparesis. Will try reglan. Return precautions discussed.     Virgel Manifold, MD 12/16/14 2242

## 2014-12-13 ENCOUNTER — Emergency Department (HOSPITAL_COMMUNITY)
Admission: EM | Admit: 2014-12-13 | Discharge: 2014-12-13 | Disposition: A | Discharge status: Home / Self Care | Payer: Medicaid Other | Attending: Emergency Medicine | Admitting: Emergency Medicine

## 2014-12-13 ENCOUNTER — Emergency Department (HOSPITAL_COMMUNITY): Payer: Medicaid Other

## 2014-12-13 ENCOUNTER — Telehealth: Payer: Self-pay | Admitting: Internal Medicine

## 2014-12-13 ENCOUNTER — Encounter (HOSPITAL_COMMUNITY): Payer: Self-pay | Admitting: Emergency Medicine

## 2014-12-13 DIAGNOSIS — R112 Nausea with vomiting, unspecified: Secondary | ICD-10-CM | POA: Diagnosis present

## 2014-12-13 DIAGNOSIS — Z9889 Other specified postprocedural states: Secondary | ICD-10-CM | POA: Diagnosis not present

## 2014-12-13 DIAGNOSIS — E119 Type 2 diabetes mellitus without complications: Secondary | ICD-10-CM | POA: Insufficient documentation

## 2014-12-13 DIAGNOSIS — Z8669 Personal history of other diseases of the nervous system and sense organs: Secondary | ICD-10-CM | POA: Diagnosis not present

## 2014-12-13 DIAGNOSIS — Z792 Long term (current) use of antibiotics: Secondary | ICD-10-CM | POA: Diagnosis not present

## 2014-12-13 DIAGNOSIS — Z794 Long term (current) use of insulin: Secondary | ICD-10-CM | POA: Insufficient documentation

## 2014-12-13 DIAGNOSIS — Z79899 Other long term (current) drug therapy: Secondary | ICD-10-CM | POA: Insufficient documentation

## 2014-12-13 DIAGNOSIS — K219 Gastro-esophageal reflux disease without esophagitis: Secondary | ICD-10-CM | POA: Insufficient documentation

## 2014-12-13 DIAGNOSIS — Z880 Allergy status to penicillin: Secondary | ICD-10-CM | POA: Insufficient documentation

## 2014-12-13 DIAGNOSIS — Z8619 Personal history of other infectious and parasitic diseases: Secondary | ICD-10-CM | POA: Diagnosis not present

## 2014-12-13 DIAGNOSIS — R1013 Epigastric pain: Secondary | ICD-10-CM

## 2014-12-13 LAB — BASIC METABOLIC PANEL
Anion gap: 9 (ref 5–15)
Anion gap: 7 (ref 5–15)
BUN: 21 mg/dL — ABNORMAL HIGH (ref 6–20)
BUN: 21 mg/dL — ABNORMAL HIGH (ref 6–20)
CO2: 24 mmol/L (ref 22–32)
CO2: 25 mmol/L (ref 22–32)
Calcium: 8.5 mg/dL — ABNORMAL LOW (ref 8.9–10.3)
Calcium: 8.2 mg/dL — ABNORMAL LOW (ref 8.9–10.3)
Chloride: 101 mmol/L (ref 101–111)
Chloride: 101 mmol/L (ref 101–111)
Creatinine, Ser: 1.13 mg/dL (ref 0.61–1.24)
Creatinine, Ser: 1.07 mg/dL (ref 0.61–1.24)
GFR calc Af Amer: >60 mL/min (ref >60)
GFR calc Af Amer: >60 mL/min (ref >60)
GFR calc non Af Amer: >60 mL/min (ref >60)
GFR calc non Af Amer: >60 mL/min (ref >60)
Glucose, Bld: 339 mg/dL — ABNORMAL HIGH (ref 70–99)
Glucose, Bld: 314 mg/dL — ABNORMAL HIGH (ref 70–99)
Potassium: 4.4 mmol/L (ref 3.5–5.1)
Potassium: 4 mmol/L (ref 3.5–5.1)
Sodium: 134 mmol/L — ABNORMAL LOW (ref 135–145)
Sodium: 133 mmol/L — ABNORMAL LOW (ref 135–145)

## 2014-12-13 LAB — I-STAT CG4 LACTIC ACID, ED
Lactic Acid, Venous: 4.01 mmol/L (ref 0.5–2.0)
Lactic Acid, Venous: 1.84 mmol/L (ref 0.5–2.0)

## 2014-12-13 LAB — CBC WITH DIFFERENTIAL/PLATELET
Basophils Absolute: 0 10*3/uL (ref 0.0–0.1)
Basophils Relative: 0 % (ref 0–1)
Eosinophils Absolute: 0 10*3/uL (ref 0.0–0.7)
Eosinophils Relative: 0 % (ref 0–5)
HCT: 41.7 % (ref 39.0–52.0)
Hemoglobin: 15 g/dL (ref 13.0–17.0)
Lymphocytes Relative: 19 % (ref 12–46)
Lymphs Abs: 1.6 10*3/uL (ref 0.7–4.0)
MCH: 31.9 pg (ref 26.0–34.0)
MCHC: 36 g/dL (ref 30.0–36.0)
MCV: 88.7 fL (ref 78.0–100.0)
Monocytes Absolute: 0.5 10*3/uL (ref 0.1–1.0)
Monocytes Relative: 6 % (ref 3–12)
Neutro Abs: 6.5 10*3/uL (ref 1.7–7.7)
Neutrophils Relative %: 75 % (ref 43–77)
Platelets: 168 10*3/uL (ref 150–400)
RBC: 4.7 MIL/uL (ref 4.22–5.81)
RDW: 12.2 % (ref 11.5–15.5)
WBC: 8.6 10*3/uL (ref 4.0–10.5)

## 2014-12-13 LAB — URINALYSIS, ROUTINE W REFLEX MICROSCOPIC
Bilirubin Urine: NEGATIVE
Glucose, UA: 500 mg/dL — AB
Leukocytes, UA: NEGATIVE
Nitrite: NEGATIVE
Protein, ur: 100 mg/dL — AB
Specific Gravity, Urine: 1.025 (ref 1.005–1.030)
Urobilinogen, UA: 0.2 mg/dL (ref 0.0–1.0)
pH: 7.5 (ref 5.0–8.0)

## 2014-12-13 LAB — URINE MICROSCOPIC-ADD ON

## 2014-12-13 LAB — COMPREHENSIVE METABOLIC PANEL
ALT: 17 U/L (ref 17–63)
AST: 22 U/L (ref 15–41)
Albumin: 3.9 g/dL (ref 3.5–5.0)
Alkaline Phosphatase: 39 U/L (ref 38–126)
Anion gap: 13 (ref 5–15)
BUN: 22 mg/dL — ABNORMAL HIGH (ref 6–20)
CO2: 23 mmol/L (ref 22–32)
Calcium: 9.2 mg/dL (ref 8.9–10.3)
Chloride: 98 mmol/L — ABNORMAL LOW (ref 101–111)
Creatinine, Ser: 1.21 mg/dL (ref 0.61–1.24)
GFR calc Af Amer: >60 mL/min (ref >60)
GFR calc non Af Amer: >60 mL/min (ref >60)
Glucose, Bld: 321 mg/dL — ABNORMAL HIGH (ref 70–99)
Potassium: 3.9 mmol/L (ref 3.5–5.1)
Sodium: 134 mmol/L — ABNORMAL LOW (ref 135–145)
Total Bilirubin: 1.4 mg/dL — ABNORMAL HIGH (ref 0.3–1.2)
Total Protein: 7.5 g/dL (ref 6.5–8.1)

## 2014-12-13 LAB — LIPASE, BLOOD: Lipase: 23 U/L (ref 22–51)

## 2014-12-13 MED ORDER — METOCLOPRAMIDE HCL 5 MG/ML IJ SOLN
10.0000 mg | Freq: Once | INTRAMUSCULAR | Status: AC
  Administered 2014-12-13: 10 mg via INTRAVENOUS
  Filled 2014-12-13: qty 2

## 2014-12-13 MED ORDER — SODIUM CHLORIDE 0.9 % IV BOLUS (SEPSIS)
1000.0000 mL | Freq: Once | INTRAVENOUS | Status: AC
  Administered 2014-12-13: 1000 mL via INTRAVENOUS

## 2014-12-13 MED ORDER — MORPHINE SULFATE 4 MG/ML IJ SOLN
4.0000 mg | Freq: Once | INTRAMUSCULAR | Status: AC
  Administered 2014-12-13: 4 mg via INTRAVENOUS
  Filled 2014-12-13: qty 1

## 2014-12-13 MED ORDER — LORAZEPAM 2 MG/ML IJ SOLN
1.0000 mg | Freq: Once | INTRAMUSCULAR | Status: AC
  Administered 2014-12-13: 1 mg via INTRAVENOUS
  Filled 2014-12-13: qty 1

## 2014-12-13 MED ORDER — LISINOPRIL 10 MG PO TABS
10.0000 mg | ORAL_TABLET | Freq: Every day | ORAL | Status: DC
  Administered 2014-12-13: 10 mg via ORAL
  Filled 2014-12-13: qty 1

## 2014-12-13 MED ORDER — PANTOPRAZOLE SODIUM 40 MG IV SOLR
40.0000 mg | Freq: Once | INTRAVENOUS | Status: AC
  Administered 2014-12-13: 40 mg via INTRAVENOUS
  Filled 2014-12-13: qty 40

## 2014-12-13 NOTE — Discharge Instructions (Signed)
Nausea and Vomiting Fill the reglan you got last night.  Dr. Olevia Perches office will call you for an appointment.  Ask about having a gastric emptying test. Return to the ED if you develop new or worsening symptoms. Nausea is a sick feeling that often comes before throwing up (vomiting). Vomiting is a reflex where stomach contents come out of your mouth. Vomiting can cause severe loss of body fluids (dehydration). Children and elderly adults can become dehydrated quickly, especially if they also have diarrhea. Nausea and vomiting are symptoms of a condition or disease. It is important to find the cause of your symptoms. CAUSES   Direct irritation of the stomach lining. This irritation can result from increased acid production (gastroesophageal reflux disease), infection, food poisoning, taking certain medicines (such as nonsteroidal anti-inflammatory drugs), alcohol use, or tobacco use.  Signals from the brain.These signals could be caused by a headache, heat exposure, an inner ear disturbance, increased pressure in the brain from injury, infection, a tumor, or a concussion, pain, emotional stimulus, or metabolic problems.  An obstruction in the gastrointestinal tract (bowel obstruction).  Illnesses such as diabetes, hepatitis, gallbladder problems, appendicitis, kidney problems, cancer, sepsis, atypical symptoms of a heart attack, or eating disorders.  Medical treatments such as chemotherapy and radiation.  Receiving medicine that makes you sleep (general anesthetic) during surgery. DIAGNOSIS Your caregiver may ask for tests to be done if the problems do not improve after a few days. Tests may also be done if symptoms are severe or if the reason for the nausea and vomiting is not clear. Tests may include:  Urine tests.  Blood tests.  Stool tests.  Cultures (to look for evidence of infection).  X-rays or other imaging studies. Test results can help your caregiver make decisions about  treatment or the need for additional tests. TREATMENT You need to stay well hydrated. Drink frequently but in small amounts.You may wish to drink water, sports drinks, clear broth, or eat frozen ice pops or gelatin dessert to help stay hydrated.When you eat, eating slowly may help prevent nausea.There are also some antinausea medicines that may help prevent nausea. HOME CARE INSTRUCTIONS   Take all medicine as directed by your caregiver.  If you do not have an appetite, do not force yourself to eat. However, you must continue to drink fluids.  If you have an appetite, eat a normal diet unless your caregiver tells you differently.  Eat a variety of complex carbohydrates (rice, wheat, potatoes, bread), lean meats, yogurt, fruits, and vegetables.  Avoid high-fat foods because they are more difficult to digest.  Drink enough water and fluids to keep your urine clear or pale yellow.  If you are dehydrated, ask your caregiver for specific rehydration instructions. Signs of dehydration may include:  Severe thirst.  Dry lips and mouth.  Dizziness.  Dark urine.  Decreasing urine frequency and amount.  Confusion.  Rapid breathing or pulse. SEEK IMMEDIATE MEDICAL CARE IF:   You have blood or brown flecks (like coffee grounds) in your vomit.  You have black or bloody stools.  You have a severe headache or stiff neck.  You are confused.  You have severe abdominal pain.  You have chest pain or trouble breathing.  You do not urinate at least once every 8 hours.  You develop cold or clammy skin.  You continue to vomit for longer than 24 to 48 hours.  You have a fever. MAKE SURE YOU:   Understand these instructions.  Will watch your  condition.  Will get help right away if you are not doing well or get worse. Document Released: 07/28/2005 Document Revised: 10/20/2011 Document Reviewed: 12/25/2010 [facility] Patient Information 2015 [place], [place]. This information is not  intended to replace advice given to you by your health care provider. Make sure you discuss any questions you have with your health care provider.

## 2014-12-13 NOTE — Telephone Encounter (Signed)
SPOKE TO PT SISTER AND HE WILL BE HERE Thursday 12/14/14 AT 8AM WITH ANNA SAMS

## 2014-12-13 NOTE — Telephone Encounter (Signed)
Dr. Wyvonnia Dusky, in ED, called about this patient. Recurrent nausea and vomiting. 2 ED visits in 2 days. Better with Reglan. Going home. Was given a prescription 2 days ago(Reglan) has not yet gotten it filled. EDP request office visit this week. Can we get him in with the extender in the next couple of days?

## 2014-12-13 NOTE — ED Notes (Signed)
Pt reports continued abdominal pain and emesis. Pt reports seen for same yesterday. Pt unsteady gait, dry heaving in triage.

## 2014-12-13 NOTE — ED Provider Notes (Signed)
CSN: [id number]     Arrival date & time 12/13/14  [id number] History  This chart was scribed for Ezequiel Essex, MD by Erling Conte, ED Scribe. This patient was seen in room APA18/APA18 and the patient's care was started at 8:58 AM.    Chief Complaint  Patient presents with  . Emesis    The history is provided by the patient. The history is limited by the condition of the patient. No language interpreter was used.   Level 5 Caveat- Condition of patient  HPI Comments: Timothy Clark is a 44 y.o. male with a h/o DM, gastritis, GERD, esophagitis and H. Pylori infection who presents to the Emergency Department complaining of episodic vomiting for 5 hours. He is having associated waxing and waning epigastric pain. Pt was seen in the ED yesterday for the same. He notes he was beginning to feel better until this morning when the emesis began. He states the vomit is yellow in color. He denies vomiting blood. Pt was previously prescribed Ptorotnix for H.pylori infection and is still taking the antibiotics. Pt takes Copazine at home to help with the nausea. He states the pain medication he was given in the hospital yesterday helped with the symptoms. Pt notes that his BS have been running high lately, when he last checked it was 212. He denies any diarrhea or chest pain  Past Medical History  Diagnosis Date  . Type 2 diabetes mellitus with complications     diagnosed around age 17  . Glaucoma   . Eye hemorrhage   . Gastritis   . GERD (gastroesophageal reflux disease)   . Esophagitis   . Hiccups   . Helicobacter pylori (H. pylori) infection    Past Surgical History  Procedure Laterality Date  . Eye surgery    . Esophagogastroduodenoscopy  2015    Dr. Britta Mccreedy  . Esophagogastroduodenoscopy N/A 10/26/2014    RMR: Distal esophagititis-likely reflux related although an element of pill induced injuruy no exclueded  status post biopsy. Diffusely abnormal gastric mucosa of uncertain signigicane -status  post gastric biopsy. Focal area of excoriation in the cardia most consistant with a trauma of heaving.    Family History  Problem Relation Age of Onset  . Ovarian cancer Mother   . Cervical cancer Sister   . Diabetes Sister   . Colon cancer Neg Hx    History  Substance Use Topics  . Smoking status: Never Smoker   . Smokeless tobacco: Never Used  . Alcohol Use: No    Review of Systems  Unable to perform ROS: Other      Allergies  Aspirin; Bactrim; Doxycycline; Penicillins; Phenergan; and Zofran  Home Medications   Prior to Admission medications   Medication Sig Start Date End Date Taking? Authorizing Provider  acetaZOLAMIDE (DIAMOX) 250 MG tablet Take 250 mg by mouth every 6 (six) hours.   Yes Historical Provider, MD  alum & mag hydroxide-simeth (MAALOX/MYLANTA) 200-200-20 MG/5ML suspension Take 30 mLs by mouth every 6 (six) hours as needed for indigestion or heartburn.   Yes Historical Provider, MD  brimonidine-timolol (COMBIGAN) 0.2-0.5 % ophthalmic solution Place 1 drop into both eyes at bedtime.   Yes Historical Provider, MD  clarithromycin (BIAXIN) 250 MG tablet Take 2 tablets (500 mg total) by mouth 2 (two) times daily. 12/06/14  Yes Campbell Riches, MD  Dapagliflozin Propanediol (FARXIGA) 10 MG TABS Take 10 mg by mouth daily.   Yes Historical Provider, MD  gabapentin (NEURONTIN) 600 MG tablet Take 600  mg by mouth daily as needed (for neuropathy).    Yes Historical Provider, MD  hydroxypropyl methylcellulose / hypromellose (ISOPTO TEARS / GONIOVISC) 2.5 % ophthalmic solution Place 1 drop into both eyes as needed for dry eyes.   Yes Historical Provider, MD  insulin aspart protamine- aspart (NOVOLOG MIX 70/30) (70-30) 100 UNIT/ML injection Inject 35 Units into the skin 3 (three) times daily.   Yes Historical Provider, MD  insulin detemir (LEVEMIR) 100 UNIT/ML injection Inject 30 Units into the skin at bedtime.   Yes Historical Provider, MD  lisinopril (PRINIVIL,ZESTRIL) 10  MG tablet Take 10 mg by mouth daily.   Yes Historical Provider, MD  metroNIDAZOLE (FLAGYL) 500 MG tablet Take 1 tablet (500 mg total) by mouth 2 (two) times daily. 12/06/14  Yes Campbell Riches, MD  ondansetron (ZOFRAN) 4 MG tablet Take 4 mg by mouth every 6 (six) hours as needed for nausea or vomiting.   Yes Historical Provider, MD  pantoprazole (PROTONIX) 40 MG tablet Take 1 tablet (40 mg total) by mouth 2 (two) times daily. 11/27/14  Yes Carlis Stable, NP  potassium chloride SA (K-DUR,KLOR-CON) 20 MEQ tablet Take 20 mEq by mouth 2 (two) times daily.   Yes Historical Provider, MD  prochlorperazine (COMPAZINE) 10 MG tablet Take 1 tablet (10 mg total) by mouth every 6 (six) hours as needed for nausea or vomiting. 10/29/14  Yes Samuella Cota, MD  sucralfate (CARAFATE) 1 G tablet Take 1 g by mouth 4 (four) times daily.   Yes Historical Provider, MD  metoCLOPramide (REGLAN) 10 MG tablet Take 1 tablet (10 mg total) by mouth every 6 (six) hours as needed for nausea or vomiting. 12/12/14   Virgel Manifold, MD  traMADol (ULTRAM) 50 MG tablet Take 1 tablet (50 mg total) by mouth every 6 (six) hours as needed. 12/12/14   Virgel Manifold, MD   Triage Vitals: BP 171/119 mmHg  Pulse 103  Temp(Src) 98.6 F (37 C) (Oral)  Resp 20  Ht 6\' 1"  (1.854 m)  Wt 240 lb (108.863 kg)  BMI 31.67 kg/m2  SpO2 100%  Physical Exam  Constitutional: He is oriented to person, place, and time. He appears well-developed and well-nourished.  Pti s actively retching yellow vomit  HENT:  Head: Normocephalic and atraumatic.  Mouth/Throat: Oropharynx is clear and moist. No oropharyngeal exudate.  Eyes: Conjunctivae and EOM are normal. Pupils are equal, round, and reactive to light.  Neck: Normal range of motion. Neck supple.  No meningismus.  Cardiovascular: Normal rate, regular rhythm, normal heart sounds and intact distal pulses.   No murmur heard. Pulmonary/Chest: Effort normal and breath sounds normal. No respiratory distress.   Abdominal: Soft. There is tenderness in the epigastric area. There is no rebound and no guarding.  No RUQ tenderness  Musculoskeletal: Normal range of motion. He exhibits no edema or tenderness.  Neurological: He is alert and oriented to person, place, and time. No cranial nerve deficit. He exhibits normal muscle tone. Coordination normal.  No ataxia on finger to nose bilaterally. No pronator drift. 5/5 strength throughout. CN 2-12 intact. Negative Romberg. Equal grip strength. Sensation intact. Gait is normal.   Skin: Skin is warm.  Psychiatric: He has a normal mood and affect. His behavior is normal.  Nursing note and vitals reviewed.     ED Course  Procedures (including critical care time)  DIAGNOSTIC STUDIES: Oxygen Saturation is 100% on RA, normal by my interpretation.    COORDINATION OF CARE: 9:29 AM- Will order  diagnostic lab work, abdominal x-ray with chest, medication and fluids. Pt advised of plan for treatment and pt agrees.   Labs Review Labs Reviewed  URINALYSIS, ROUTINE W REFLEX MICROSCOPIC - Abnormal; Notable for the following:    Glucose, UA 500 (*)    Hgb urine dipstick SMALL (*)    Ketones, ur TRACE (*)    Protein, ur 100 (*)    All other components within normal limits  COMPREHENSIVE METABOLIC PANEL - Abnormal; Notable for the following:    Sodium 134 (*)    Chloride 98 (*)    Glucose, Bld 321 (*)    BUN 22 (*)    Total Bilirubin 1.4 (*)    All other components within normal limits  BASIC METABOLIC PANEL - Abnormal; Notable for the following:    Sodium 134 (*)    Glucose, Bld 339 (*)    BUN 21 (*)    Calcium 8.5 (*)    All other components within normal limits  BASIC METABOLIC PANEL - Abnormal; Notable for the following:    Sodium 133 (*)    Glucose, Bld 314 (*)    BUN 21 (*)    Calcium 8.2 (*)    All other components within normal limits  I-STAT CG4 LACTIC ACID, ED - Abnormal; Notable for the following:    Lactic Acid, Venous 4.01 (*)    All  other components within normal limits  CBC WITH DIFFERENTIAL/PLATELET  LIPASE, BLOOD  URINE MICROSCOPIC-ADD ON  URINE RAPID DRUG SCREEN (HOSP PERFORMED)  I-STAT CG4 LACTIC ACID, ED  I-STAT CG4 LACTIC ACID, ED  I-STAT CG4 LACTIC ACID, ED    Imaging Review Dg Chest 2 View  12/12/2014   CLINICAL DATA:  Shortness of breath, emesis  EXAM: CHEST  2 VIEW  COMPARISON:  None.  FINDINGS: The heart size and mediastinal contours are within normal limits. Both lungs are clear. The visualized skeletal structures are unremarkable.  IMPRESSION: No active cardiopulmonary disease.   Electronically Signed   By: Kathreen Devoid   On: 12/12/2014 20:04     EKG Interpretation None      MDM   Final diagnoses:  Epigastric pain  Nausea and vomiting, vomiting of unspecified type   Patient with nausea vomiting and epigastric pain seen for the same yesterday. Previous visits for the same diagnosis with gastritis, and completed treatment for H. pylori. States compliance with medications.  Labs show lactic acidosis. BP and heart rate are elevated. Hyperglycemia with anion gap 13  Lactate 4. Anion gap 13.  After hydration, lactate is clear to 1.8. Patient feels improved and is tolerating by mouth. He remains mildly tachycardic and hypertensive. He did not take his blood pressure medication today.  Discussed with Dr. Gala Romney who will attempt to get patient in office appointment this week. He agrees with the Reglan prescription and patient received yesterday but did not fill. Some concern for possible gastroparesis given his diabetes.  On recheck, lactate has cleared, anion gap is normal.  Not in DKA.  Mild tachycardia and hypertension persist.  Home meds given.  Tolerating PO and stable for outpatient followup with GI. Return precautions discussed.  Ezequiel Essex, MD 12/13/14 [phone]

## 2014-12-14 ENCOUNTER — Other Ambulatory Visit: Payer: Self-pay

## 2014-12-14 ENCOUNTER — Ambulatory Visit (INDEPENDENT_AMBULATORY_CARE_PROVIDER_SITE_OTHER): Payer: Medicaid Other | Admitting: Gastroenterology

## 2014-12-14 ENCOUNTER — Encounter: Payer: Self-pay | Admitting: Gastroenterology

## 2014-12-14 VITALS — BP 160/98 | HR 93 | Temp 97.6°F | Ht 72.0 in | Wt 253.6 lb

## 2014-12-14 DIAGNOSIS — R1013 Epigastric pain: Secondary | ICD-10-CM

## 2014-12-14 DIAGNOSIS — R109 Unspecified abdominal pain: Secondary | ICD-10-CM

## 2014-12-14 MED ORDER — PANTOPRAZOLE SODIUM 40 MG PO TBEC: 40.0000 mg | DELAYED_RELEASE_TABLET | Freq: Two times a day (BID) | ORAL | Status: DC

## 2014-12-14 NOTE — Patient Instructions (Addendum)
Continue taking Protonix twice a day, 30 minutes before breakfast and dinner.   We have scheduled you for a HIDA scan.   Avoid fatty foods. Avoid hamburger. Further recommendations to follow!

## 2014-12-14 NOTE — Progress Notes (Signed)
Referring Provider: Tawni Carnes, PA-C Primary Care Physician:  Jeri Modena  Chief Complaint  Patient presents with  . Abdominal Pain    HPI:   Timothy Clark is a 44 y.o. male presenting today with a history of H.pylori gastritis and multiple drug allergies requiring treatment with Protonix, clarithromycin, and Flagyl. Completed 14 day course and has seen ID in interim. Needs urea breath test vs stool antigen test in near future for assessment of eradication.    States he was doing well until Sunday morning, then had N/V and felt better. Tuesday recurrent symptoms. Hiccups noted, with severe epigastric pain and vomiting. Gallbladder remains in situ. Symptoms have been present for at least a year. Sunday ate a hamburger steak, mashed potatoes, saladTuesday ate 2 bites of double cheeseburger, threw the rest away. Felt full then started having pain. Has associated hiccups. Notes correlation of symptoms with hamburger.   Past Medical History  Diagnosis Date  . Type 2 diabetes mellitus with complications     diagnosed around age 26  . Glaucoma   . Eye hemorrhage   . Gastritis   . GERD (gastroesophageal reflux disease)   . Esophagitis   . Hiccups   . Helicobacter pylori (H. pylori) infection     Past Surgical History  Procedure Laterality Date  . Eye surgery    . Esophagogastroduodenoscopy  2015    Dr. Britta Mccreedy  . Esophagogastroduodenoscopy N/A 10/26/2014    RMR: Distal esophagititis-likely reflux related although an element of pill induced injuruy no exclueded  status post biopsy. Diffusely abnormal gastric mucosa of uncertain signigicane -status post gastric biopsy. Focal area of excoriation in the cardia most consistant with a trauma of heaving.     Current Outpatient Prescriptions  Medication Sig Dispense Refill  . acetaZOLAMIDE (DIAMOX) 250 MG tablet Take 250 mg by mouth every 6 (six) hours.    [name] alum & mag hydroxide-simeth (MAALOX/MYLANTA) 200-200-20 MG/5ML  suspension Take 30 mLs by mouth every 6 (six) hours as needed for indigestion or heartburn.    . brimonidine-timolol (COMBIGAN) 0.2-0.5 % ophthalmic solution Place 1 drop into both eyes at bedtime.    . Dapagliflozin Propanediol (FARXIGA) 10 MG TABS Take 10 mg by mouth daily.    [name] gabapentin (NEURONTIN) 600 MG tablet Take 600 mg by mouth daily as needed (for neuropathy).     . hydroxypropyl methylcellulose / hypromellose (ISOPTO TEARS / GONIOVISC) 2.5 % ophthalmic solution Place 1 drop into both eyes as needed for dry eyes.    . insulin aspart protamine- aspart (NOVOLOG MIX 70/30) (70-30) 100 UNIT/ML injection Inject 35 Units into the skin 3 (three) times daily.    . insulin detemir (LEVEMIR) 100 UNIT/ML injection Inject 30 Units into the skin at bedtime.    [name] lisinopril (PRINIVIL,ZESTRIL) 10 MG tablet Take 10 mg by mouth daily.    . metoCLOPramide (REGLAN) 10 MG tablet Take 1 tablet (10 mg total) by mouth every 6 (six) hours as needed for nausea or vomiting. 30 tablet 0  . ondansetron (ZOFRAN) 4 MG tablet Take 4 mg by mouth every 6 (six) hours as needed for nausea or vomiting.    . pantoprazole (PROTONIX) 40 MG tablet Take 1 tablet (40 mg total) by mouth 2 (two) times daily. 60 tablet 0  . potassium chloride SA (K-DUR,KLOR-CON) 20 MEQ tablet Take 20 mEq by mouth 2 (two) times daily.    . prochlorperazine (COMPAZINE) 10 MG tablet Take 1 tablet (10 mg total) by mouth every  6 (six) hours as needed for nausea or vomiting. 30 tablet 0  . sucralfate (CARAFATE) 1 G tablet Take 1 g by mouth 4 (four) times daily.    . traMADol (ULTRAM) 50 MG tablet Take 1 tablet (50 mg total) by mouth every 6 (six) hours as needed. 10 tablet 0   No current facility-administered medications for this visit.    Allergies as of 12/14/2014 - Review Complete 12/14/2014  Allergen Reaction Noted  . Aspirin Hives and Itching 03/22/2009  . Bactrim [sulfamethoxazole-trimethoprim] Itching 12/06/2014  . Doxycycline Hives and  Itching 03/20/2014  . Penicillins Hives and Itching 03/22/2009  . Phenergan [promethazine hcl] Nausea And Vomiting 10/27/2014  . Zofran [ondansetron hcl] Other (See Comments) 10/27/2014    Family History  Problem Relation Age of Onset  . Ovarian cancer Mother   . Cervical cancer Sister   . Diabetes Sister   . Colon cancer Neg Hx     History   Social History  . Marital Status: Single    Spouse Name: N/A  . Number of Children: N/A  . Years of Education: N/A   Social History Main Topics  . Smoking status: Never Smoker   . Smokeless tobacco: Never Used  . Alcohol Use: No  . Drug Use: No  . Sexual Activity: Not on file   Other Topics Concern  . None   Social History Narrative    Review of Systems: As mentioned in HPI Physical Exam: BP 160/98 mmHg  Pulse 93  Temp(Src) 97.6 F (36.4 C) (Oral)  Ht 6' (1.829 m)  Wt 253 lb 9.6 oz (115.032 kg)  BMI 34.39 kg/m2 General:   Alert and oriented. No distress noted. Pleasant and cooperative.  Head:  Normocephalic and atraumatic. Eyes:  Conjuctiva clear without scleral icterus. Mouth:  Oral mucosa pink and moist. Good dentition. No lesions. Heart:  S1, S2 present without murmurs, rubs, or gallops. Regular rate and rhythm. Abdomen:  +BS, soft, non-tender and non-distended. No rebound or guarding. No HSM or masses noted. Msk:  Symmetrical without gross deformities. Normal posture. Extremities:  Without edema. Neurologic:  Alert and  oriented x4;  grossly normal neurologically. Skin:  Intact without significant lesions or rashes. Psych:  Alert and cooperative. Normal mood and affect.  Lab Results  Component Value Date   WBC 8.6 12/13/2014   HGB 15.0 12/13/2014   HCT 41.7 12/13/2014   MCV 88.7 12/13/2014   PLT 168 12/13/2014   Lab Results  Component Value Date   ALT 17 12/13/2014   AST 22 12/13/2014   ALKPHOS 39 12/13/2014   BILITOT 1.4* 12/13/2014   Lab Results  Component Value Date   CREATININE 1.07 12/13/2014    BUN 21* 12/13/2014   NA 133* 12/13/2014   K 4.0 12/13/2014   CL 101 12/13/2014   CO2 25 12/13/2014   Lab Results  Component Value Date   LIPASE 23 12/13/2014

## 2014-12-15 ENCOUNTER — Encounter (HOSPITAL_COMMUNITY)
Admission: RE | Admit: 2014-12-15 | Discharge: 2014-12-15 | Disposition: A | Discharge status: Home / Self Care | Payer: Medicaid Other | Source: Ambulatory Visit | Attending: Gastroenterology | Admitting: Gastroenterology

## 2014-12-15 ENCOUNTER — Encounter (HOSPITAL_COMMUNITY): Payer: Self-pay

## 2014-12-15 DIAGNOSIS — R109 Unspecified abdominal pain: Secondary | ICD-10-CM | POA: Insufficient documentation

## 2014-12-15 HISTORY — DX: Essential (primary) hypertension: I10

## 2014-12-15 MED ORDER — TECHNETIUM TC 99M MEBROFENIN IV KIT
5.0000 | PACK | Freq: Once | INTRAVENOUS | Status: AC | PRN
  Administered 2014-12-15: 5 via INTRAVENOUS

## 2014-12-15 MED ORDER — SINCALIDE 5 MCG IJ SOLR
INTRAMUSCULAR | Status: AC
  Administered 2014-12-15: 2.3 ug via INTRAVENOUS
  Filled 2014-12-15: qty 5

## 2014-12-15 MED ORDER — STERILE WATER FOR INJECTION IJ SOLN
INTRAMUSCULAR | Status: AC
  Administered 2014-12-15: 2.3 mL via INTRAVENOUS
  Filled 2014-12-15: qty 10

## 2014-12-15 MED ORDER — SODIUM CHLORIDE 0.9 % IJ SOLN
INTRAMUSCULAR | Status: AC
  Filled 2014-12-15: qty 12

## 2014-12-20 NOTE — Assessment & Plan Note (Addendum)
44 year old male recently treated for H.pylori gastritis, s/p 14 days of therapy, with persistent symptoms of intermittent epigastric pain, N/V. Gallbladder remains in situ. [place] earlier this year with stable sludge and no signs of acute cholecystitis. Symptoms correlate with hamburger. Instructed to avoid fatty/fried foods. Will proceed with HIDA scan to further evaluate any underlying biliary etiology. Will need urea breath test vs stool antigen test in the near future.

## 2014-12-25 NOTE — Progress Notes (Signed)
Quick Note:  Normal HIDA. Doubt biliary component. Continue to avoid hamburger. How is he? ______

## 2014-12-26 ENCOUNTER — Encounter: Payer: Self-pay | Admitting: Internal Medicine

## 2014-12-26 NOTE — Progress Notes (Signed)
Quick Note:  Timothy Clark! Follow-up in 3 months. ______

## 2014-12-26 NOTE — Progress Notes (Signed)
APPOINTMENT MADE AND LETTER SENT °

## 2014-12-29 ENCOUNTER — Ambulatory Visit: Payer: PRIVATE HEALTH INSURANCE | Admitting: Internal Medicine

## 2015-01-10 ENCOUNTER — Encounter: Payer: Self-pay | Admitting: Gastroenterology

## 2015-01-10 ENCOUNTER — Ambulatory Visit (INDEPENDENT_AMBULATORY_CARE_PROVIDER_SITE_OTHER): Payer: Medicaid Other | Admitting: Gastroenterology

## 2015-01-10 ENCOUNTER — Other Ambulatory Visit: Payer: Self-pay

## 2015-01-10 VITALS — BP 130/90 | HR 97 | Temp 98.5°F | Ht 72.0 in | Wt 252.4 lb

## 2015-01-10 DIAGNOSIS — R109 Unspecified abdominal pain: Secondary | ICD-10-CM

## 2015-01-10 DIAGNOSIS — B9681 Helicobacter pylori [H. pylori] as the cause of diseases classified elsewhere: Secondary | ICD-10-CM | POA: Diagnosis not present

## 2015-01-10 DIAGNOSIS — R1013 Epigastric pain: Secondary | ICD-10-CM | POA: Diagnosis not present

## 2015-01-10 DIAGNOSIS — A048 Other specified bacterial intestinal infections: Secondary | ICD-10-CM

## 2015-01-10 MED ORDER — SUCRALFATE 1 G PO TABS: 1.0000 g | ORAL_TABLET | Freq: Four times a day (QID) | ORAL | Status: DC

## 2015-01-10 NOTE — Assessment & Plan Note (Addendum)
44 year old with recent diagnosis of H.pylori, completing antibiotics for total of 14 days. Needs eradication documentation. ID recommended to consider repeat EGD or current protocol per our practice. Urea breath test would be appropriate, but he is unable to come off of a PPI long enough to do this. Will proceed with evaluation for biliary etiology to persistent symptoms by referring to Dr. Arnoldo Morale and also a hydrogen breath test. In this scenario, a repeat EGD may be indicated if he continues to have persistent symptoms and biliary etiology is not felt to be a contributor. Will wait to hear from Dr. Arnoldo Morale for further input. As of note, US abdomen showed gallbladder sludge but HIDA was normal. With persistent nausea, unable to exclude underlying delayed gastric emptying in the setting of diabetes; however, persistent abdominal pain would be less likely associated with a diagnosis of gastroparesis but unable to exclude delayed gastric emptying as playing a role in dyspepsia in this scenario.

## 2015-01-10 NOTE — Progress Notes (Signed)
Referring Provider: Tawni Carnes, PA-C Primary Care Physician:  Jeri Modena  Primary GI: Dr. Gala Romney   Chief Complaint  Patient presents with  . Abdominal Pain    HPI:   Timothy Clark is a 44 y.o. male presenting today with a history of H.pylori gastritis and multiple drug allergies requiring treatment with Protonix, clarithromycin, and Flagyl.   Protonix BID. US abdomen March 2016 stable appearance of gallbladder sludge, fatty liver. HIDA scan normal, with EF of 92%. No reproduction of symptoms. CT April 2016 moderate stool, no bowel obstruction.   Pain is located upper abdomen feels underlying, intensifies in the morning time, taking till 11 or 12 till leveling down. Currently at a 7 or 8. Wakes up in the morning with pain present. Carafate helps to ease down. No constipation or diarrhea. Occasional nausea, not exacerbated by eating. Historically, hamburger beef caused significant pain. Eating chicken and salads now. Notes early satiety. Starts sweating with eating now. Gets soaking wet. Hiccups starting to come back. Felt better after taking antibiotics. Symptoms chronic. Doesn't feel bloated.   Past Medical History  Diagnosis Date  . Type 2 diabetes mellitus with complications     diagnosed around age 50  . Glaucoma   . Eye hemorrhage   . Gastritis   . GERD (gastroesophageal reflux disease)   . Esophagitis   . Hiccups   . Helicobacter pylori (H. pylori) infection   . Hypertension     Past Surgical History  Procedure Laterality Date  . Eye surgery    . Esophagogastroduodenoscopy  2015    Dr. Britta Mccreedy  . Esophagogastroduodenoscopy N/A 10/26/2014    RMR: Distal esophagititis-likely reflux related although an element of pill induced injuruy no exclueded  status post biopsy. Diffusely abnormal gastric mucosa of uncertain signigicane -status post gastric biopsy. Focal area of excoriation in the cardia most consistant with a trauma of heaving.     Current  Outpatient Prescriptions  Medication Sig Dispense Refill  . acetaZOLAMIDE (DIAMOX) 250 MG tablet Take 250 mg by mouth every 6 (six) hours.    [name] alum & mag hydroxide-simeth (MAALOX/MYLANTA) 200-200-20 MG/5ML suspension Take 30 mLs by mouth every 6 (six) hours as needed for indigestion or heartburn.    . brimonidine-timolol (COMBIGAN) 0.2-0.5 % ophthalmic solution Place 1 drop into both eyes at bedtime.    . Dapagliflozin Propanediol (FARXIGA) 10 MG TABS Take 10 mg by mouth daily.    [name] gabapentin (NEURONTIN) 600 MG tablet Take 600 mg by mouth daily as needed (for neuropathy).     . hydroxypropyl methylcellulose / hypromellose (ISOPTO TEARS / GONIOVISC) 2.5 % ophthalmic solution Place 1 drop into both eyes as needed for dry eyes.    . insulin aspart protamine- aspart (NOVOLOG MIX 70/30) (70-30) 100 UNIT/ML injection Inject 35 Units into the skin 3 (three) times daily.    . insulin detemir (LEVEMIR) 100 UNIT/ML injection Inject 30 Units into the skin at bedtime.    [name] lisinopril (PRINIVIL,ZESTRIL) 10 MG tablet Take 10 mg by mouth daily.    . metoCLOPramide (REGLAN) 10 MG tablet Take 1 tablet (10 mg total) by mouth every 6 (six) hours as needed for nausea or vomiting. 30 tablet 0  . ondansetron (ZOFRAN) 4 MG tablet Take 4 mg by mouth every 6 (six) hours as needed for nausea or vomiting.    . pantoprazole (PROTONIX) 40 MG tablet Take 1 tablet (40 mg total) by mouth 2 (two) times daily before a meal. 60 tablet 3  .  potassium chloride SA (K-DUR,KLOR-CON) 20 MEQ tablet Take 20 mEq by mouth 2 (two) times daily.    . sucralfate (CARAFATE) 1 G tablet Take 1 g by mouth 4 (four) times daily.    . prochlorperazine (COMPAZINE) 10 MG tablet Take 1 tablet (10 mg total) by mouth every 6 (six) hours as needed for nausea or vomiting. (Patient not taking: Reported on 01/10/2015) 30 tablet 0  . traMADol (ULTRAM) 50 MG tablet Take 1 tablet (50 mg total) by mouth every 6 (six) hours as needed. (Patient not taking: Reported on  01/10/2015) 10 tablet 0   No current facility-administered medications for this visit.    Allergies as of 01/10/2015 - Review Complete 01/10/2015  Allergen Reaction Noted  . Aspirin Hives and Itching 03/22/2009  . Bactrim [sulfamethoxazole-trimethoprim] Itching 12/06/2014  . Doxycycline Hives and Itching 03/20/2014  . Penicillins Hives and Itching 03/22/2009  . Phenergan [promethazine hcl] Nausea And Vomiting 10/27/2014  . Zofran [ondansetron hcl] Other (See Comments) 10/27/2014    Family History  Problem Relation Age of Onset  . Ovarian cancer Mother   . Cervical cancer Sister   . Diabetes Sister   . Colon cancer Neg Hx     History   Social History  . Marital Status: Single    Spouse Name: N/A  . Number of Children: N/A  . Years of Education: N/A   Social History Main Topics  . Smoking status: Never Smoker   . Smokeless tobacco: Never Used  . Alcohol Use: No  . Drug Use: No  . Sexual Activity: Not on file   Other Topics Concern  . None   Social History Narrative    Review of Systems: As mentioned in HPI.   Physical Exam: BP 130/90 mmHg  Pulse 97  Temp(Src) 98.5 F (36.9 C) (Oral)  Ht 6' (1.829 m)  Wt 252 lb 6.4 oz (114.488 kg)  BMI 34.22 kg/m2 General:   Alert and oriented. No distress noted. Pleasant and cooperative.  Head:  Normocephalic and atraumatic. Eyes:  Conjuctiva clear without scleral icterus. Mouth:  Oral mucosa pink and moist. Good dentition. No lesions. Abdomen:  +BS, soft, TTP epigastric area and non-distended. No rebound or guarding. No HSM or masses noted. Msk:  Symmetrical without gross deformities. Normal posture. Extremities:  Without edema. Neurologic:  Alert and  oriented x4;  grossly normal neurologically. Psych:  Alert and cooperative. Normal mood and affect.  Lab Results  Component Value Date   ALT 17 12/13/2014   AST 22 12/13/2014   ALKPHOS 39 12/13/2014   BILITOT 1.4* 12/13/2014   Lab Results  Component Value Date    CREATININE 1.07 12/13/2014   BUN 21* 12/13/2014   NA 133* 12/13/2014   K 4.0 12/13/2014   CL 101 12/13/2014   CO2 25 12/13/2014

## 2015-01-10 NOTE — Patient Instructions (Signed)
We have referred you to Dr. Arnoldo Morale to talk about removing your gallbladder.   I would like to do a hydrogen breath test to make sure you don't have bacterial overgrowth.

## 2015-01-11 ENCOUNTER — Encounter (HOSPITAL_COMMUNITY): Payer: Self-pay

## 2015-01-11 ENCOUNTER — Other Ambulatory Visit: Payer: Self-pay

## 2015-01-11 ENCOUNTER — Emergency Department (HOSPITAL_COMMUNITY)
Admission: EM | Admit: 2015-01-11 | Discharge: 2015-01-11 | Disposition: A | Discharge status: Home / Self Care | Payer: Medicaid Other | Attending: Emergency Medicine | Admitting: Emergency Medicine

## 2015-01-11 DIAGNOSIS — Z794 Long term (current) use of insulin: Secondary | ICD-10-CM | POA: Diagnosis not present

## 2015-01-11 DIAGNOSIS — H409 Unspecified glaucoma: Secondary | ICD-10-CM | POA: Insufficient documentation

## 2015-01-11 DIAGNOSIS — Z880 Allergy status to penicillin: Secondary | ICD-10-CM | POA: Diagnosis not present

## 2015-01-11 DIAGNOSIS — Z8619 Personal history of other infectious and parasitic diseases: Secondary | ICD-10-CM | POA: Diagnosis not present

## 2015-01-11 DIAGNOSIS — Z79899 Other long term (current) drug therapy: Secondary | ICD-10-CM | POA: Insufficient documentation

## 2015-01-11 DIAGNOSIS — R1013 Epigastric pain: Secondary | ICD-10-CM | POA: Diagnosis present

## 2015-01-11 DIAGNOSIS — E119 Type 2 diabetes mellitus without complications: Secondary | ICD-10-CM | POA: Insufficient documentation

## 2015-01-11 DIAGNOSIS — K2100 Gastro-esophageal reflux disease with esophagitis, without bleeding: Secondary | ICD-10-CM

## 2015-01-11 DIAGNOSIS — I10 Essential (primary) hypertension: Secondary | ICD-10-CM | POA: Insufficient documentation

## 2015-01-11 LAB — CBG MONITORING, ED: Glucose-Capillary: 221 mg/dL — ABNORMAL HIGH (ref 65–99)

## 2015-01-11 MED ORDER — HYDROCODONE-ACETAMINOPHEN 5-325 MG PO TABS: 1.0000 | ORAL_TABLET | Freq: Four times a day (QID) | ORAL | Status: DC | PRN

## 2015-01-11 MED ORDER — SUCRALFATE 1 G PO TABS
1.0000 g | ORAL_TABLET | Freq: Three times a day (TID) | ORAL | Status: DC
  Administered 2015-01-11: 1 g via ORAL
  Filled 2015-01-11: qty 1

## 2015-01-11 NOTE — ED Provider Notes (Signed)
CSN: [id number]     Arrival date & time 01/11/15  1212 History   First MD Initiated Contact with Patient 01/11/15 1544     Chief Complaint  Patient presents with  . Abdominal Pain     (Consider location/radiation/quality/duration/timing/severity/associated sxs/prior Treatment) Patient is a 44 y.o. male presenting with abdominal pain. The history is provided by the patient (the pt states he had abd pain, but improved now.  he has hx of bad gerd.  is to follow up with surgeon).  Abdominal Pain Pain location:  Epigastric Pain quality: aching   Pain radiates to:  Does not radiate Pain severity:  Moderate Onset quality:  Gradual Timing:  Intermittent Progression:  Waxing and waning Chronicity:  New Context: not alcohol use   Associated symptoms: no chest pain, no cough, no diarrhea, no fatigue and no hematuria     Past Medical History  Diagnosis Date  . Type 2 diabetes mellitus with complications     diagnosed around age 25  . Glaucoma   . Eye hemorrhage   . Gastritis   . GERD (gastroesophageal reflux disease)   . Esophagitis   . Hiccups   . Helicobacter pylori (H. pylori) infection   . Hypertension    Past Surgical History  Procedure Laterality Date  . Eye surgery    . Esophagogastroduodenoscopy  2015    Dr. Britta Mccreedy  . Esophagogastroduodenoscopy N/A 10/26/2014    RMR: Distal esophagititis-likely reflux related although an element of pill induced injuruy no exclueded  status post biopsy. Diffusely abnormal gastric mucosa of uncertain signigicane -status post gastric biopsy. Focal area of excoriation in the cardia most consistant with a trauma of heaving.    Family History  Problem Relation Age of Onset  . Ovarian cancer Mother   . Cervical cancer Sister   . Diabetes Sister   . Colon cancer Neg Hx    History  Substance Use Topics  . Smoking status: Never Smoker   . Smokeless tobacco: Never Used  . Alcohol Use: No    Review of Systems  Constitutional: Negative for  appetite change and fatigue.  HENT: Negative for congestion, ear discharge and sinus pressure.   Eyes: Negative for discharge.  Respiratory: Negative for cough.   Cardiovascular: Negative for chest pain.  Gastrointestinal: Positive for abdominal pain. Negative for diarrhea.  Genitourinary: Negative for frequency and hematuria.  Musculoskeletal: Negative for back pain.  Skin: Negative for rash.  Neurological: Negative for seizures and headaches.  Psychiatric/Behavioral: Negative for hallucinations.      Allergies  Aspirin; Bactrim; Doxycycline; Penicillins; Phenergan; and Zofran  Home Medications   Prior to Admission medications   Medication Sig Start Date End Date Taking? Authorizing Provider  acetaZOLAMIDE (DIAMOX) 250 MG tablet Take 250 mg by mouth every 6 (six) hours.    Historical Provider, MD  alum & mag hydroxide-simeth (MAALOX/MYLANTA) 200-200-20 MG/5ML suspension Take 30 mLs by mouth every 6 (six) hours as needed for indigestion or heartburn.    Historical Provider, MD  brimonidine-timolol (COMBIGAN) 0.2-0.5 % ophthalmic solution Place 1 drop into both eyes at bedtime.    Historical Provider, MD  Dapagliflozin Propanediol (FARXIGA) 10 MG TABS Take 10 mg by mouth daily.    Historical Provider, MD  gabapentin (NEURONTIN) 600 MG tablet Take 600 mg by mouth daily as needed (for neuropathy).     Historical Provider, MD  HYDROcodone-acetaminophen (NORCO/VICODIN) 5-325 MG per tablet Take 1 tablet by mouth every 6 (six) hours as needed for moderate pain. 01/11/15  Milton Ferguson, MD  hydroxypropyl methylcellulose / hypromellose (ISOPTO TEARS / GONIOVISC) 2.5 % ophthalmic solution Place 1 drop into both eyes as needed for dry eyes.    Historical Provider, MD  insulin aspart protamine- aspart (NOVOLOG MIX 70/30) (70-30) 100 UNIT/ML injection Inject 35 Units into the skin 3 (three) times daily.    Historical Provider, MD  insulin detemir (LEVEMIR) 100 UNIT/ML injection Inject 30 Units into  the skin at bedtime.    Historical Provider, MD  lisinopril (PRINIVIL,ZESTRIL) 10 MG tablet Take 10 mg by mouth daily.    Historical Provider, MD  metoCLOPramide (REGLAN) 10 MG tablet Take 1 tablet (10 mg total) by mouth every 6 (six) hours as needed for nausea or vomiting. 12/12/14   Virgel Manifold, MD  ondansetron (ZOFRAN) 4 MG tablet Take 4 mg by mouth every 6 (six) hours as needed for nausea or vomiting.    Historical Provider, MD  pantoprazole (PROTONIX) 40 MG tablet Take 1 tablet (40 mg total) by mouth 2 (two) times daily before a meal. 12/14/14   Orvil Feil, NP  potassium chloride SA (K-DUR,KLOR-CON) 20 MEQ tablet Take 20 mEq by mouth 2 (two) times daily.    Historical Provider, MD  prochlorperazine (COMPAZINE) 10 MG tablet Take 1 tablet (10 mg total) by mouth every 6 (six) hours as needed for nausea or vomiting. Patient not taking: Reported on 01/10/2015 10/29/14   Samuella Cota, MD  sucralfate (CARAFATE) 1 G tablet Take 1 tablet (1 g total) by mouth 4 (four) times daily. 01/10/15   Orvil Feil, NP  traMADol (ULTRAM) 50 MG tablet Take 1 tablet (50 mg total) by mouth every 6 (six) hours as needed. Patient not taking: Reported on 01/10/2015 12/12/14   Virgel Manifold, MD   BP 119/84 mmHg  Pulse 77  Temp(Src) 97.2 F (36.2 C) (Oral)  Resp 14  Ht 6' (1.829 m)  Wt 249 lb (112.946 kg)  BMI 33.76 kg/m2  SpO2 98% Physical Exam  Constitutional: He is oriented to person, place, and time. He appears well-developed.  HENT:  Head: Normocephalic.  Eyes: Conjunctivae and EOM are normal. No scleral icterus.  Neck: Neck supple. No thyromegaly present.  Cardiovascular: Normal rate and regular rhythm.  Exam reveals no gallop and no friction rub.   No murmur heard. Pulmonary/Chest: No stridor. He has no wheezes. He has no rales. He exhibits no tenderness.  Abdominal: He exhibits no distension. There is tenderness. There is no rebound.  Mild epigastric tender  Musculoskeletal: Normal range of motion. He  exhibits no edema.  Lymphadenopathy:    He has no cervical adenopathy.  Neurological: He is oriented to person, place, and time. He exhibits normal muscle tone. Coordination normal.  Skin: No rash noted. No erythema.  Psychiatric: He has a normal mood and affect. His behavior is normal.    ED Course  Procedures (including critical care time) Labs Review Labs Reviewed  CBG MONITORING, ED - Abnormal; Notable for the following:    Glucose-Capillary 221 (*)    All other components within normal limits    Imaging Review No results found.   EKG Interpretation None      MDM   Final diagnoses:  Gastroesophageal reflux disease with esophagitis    Pt states gi cocktail usually helps, but he is better and does not want one now.  He is to follow with md    Milton Ferguson, MD 01/11/15 [phone]

## 2015-01-11 NOTE — Discharge Instructions (Signed)
Follow up with the specialist as planned.  Return if problems

## 2015-01-11 NOTE — ED Notes (Signed)
Pt c/o epigastric pain with n/v since this morning.  Saw GI yesterday and has a hydrogen breath test scheduled for Monday.     Reports has been referred to Dr. Arnoldo Morale to talk about removing gallbladder.

## 2015-01-14 ENCOUNTER — Emergency Department (HOSPITAL_COMMUNITY): Payer: Medicaid Other

## 2015-01-14 ENCOUNTER — Emergency Department (HOSPITAL_COMMUNITY)
Admission: EM | Admit: 2015-01-14 | Discharge: 2015-01-14 | Disposition: A | Discharge status: Home / Self Care | Payer: Medicaid Other | Source: Home / Self Care | Attending: Emergency Medicine | Admitting: Emergency Medicine

## 2015-01-14 ENCOUNTER — Inpatient Hospital Stay (HOSPITAL_COMMUNITY)
Admission: EM | Admit: 2015-01-14 | Discharge: 2015-01-16 | DRG: 074 | Disposition: A | Discharge status: Home / Self Care | Payer: Medicaid Other | Attending: Internal Medicine | Admitting: Internal Medicine

## 2015-01-14 ENCOUNTER — Encounter (HOSPITAL_COMMUNITY): Payer: Self-pay

## 2015-01-14 ENCOUNTER — Encounter (HOSPITAL_COMMUNITY): Payer: Self-pay | Admitting: *Deleted

## 2015-01-14 ENCOUNTER — Other Ambulatory Visit: Payer: Self-pay

## 2015-01-14 DIAGNOSIS — E860 Dehydration: Secondary | ICD-10-CM | POA: Diagnosis present

## 2015-01-14 DIAGNOSIS — E1143 Type 2 diabetes mellitus with diabetic autonomic (poly)neuropathy: Principal | ICD-10-CM | POA: Diagnosis present

## 2015-01-14 DIAGNOSIS — E876 Hypokalemia: Secondary | ICD-10-CM | POA: Diagnosis present

## 2015-01-14 DIAGNOSIS — E1165 Type 2 diabetes mellitus with hyperglycemia: Secondary | ICD-10-CM | POA: Diagnosis present

## 2015-01-14 DIAGNOSIS — E118 Type 2 diabetes mellitus with unspecified complications: Secondary | ICD-10-CM

## 2015-01-14 DIAGNOSIS — K219 Gastro-esophageal reflux disease without esophagitis: Secondary | ICD-10-CM | POA: Insufficient documentation

## 2015-01-14 DIAGNOSIS — I10 Essential (primary) hypertension: Secondary | ICD-10-CM | POA: Diagnosis present

## 2015-01-14 DIAGNOSIS — H409 Unspecified glaucoma: Secondary | ICD-10-CM | POA: Diagnosis present

## 2015-01-14 DIAGNOSIS — Z881 Allergy status to other antibiotic agents status: Secondary | ICD-10-CM

## 2015-01-14 DIAGNOSIS — Z886 Allergy status to analgesic agent status: Secondary | ICD-10-CM

## 2015-01-14 DIAGNOSIS — Z794 Long term (current) use of insulin: Secondary | ICD-10-CM

## 2015-01-14 DIAGNOSIS — Z880 Allergy status to penicillin: Secondary | ICD-10-CM | POA: Insufficient documentation

## 2015-01-14 DIAGNOSIS — R1013 Epigastric pain: Secondary | ICD-10-CM | POA: Diagnosis present

## 2015-01-14 DIAGNOSIS — Z8619 Personal history of other infectious and parasitic diseases: Secondary | ICD-10-CM

## 2015-01-14 DIAGNOSIS — R1110 Vomiting, unspecified: Secondary | ICD-10-CM | POA: Diagnosis present

## 2015-01-14 DIAGNOSIS — Z79899 Other long term (current) drug therapy: Secondary | ICD-10-CM | POA: Insufficient documentation

## 2015-01-14 DIAGNOSIS — Z833 Family history of diabetes mellitus: Secondary | ICD-10-CM

## 2015-01-14 DIAGNOSIS — R109 Unspecified abdominal pain: Secondary | ICD-10-CM

## 2015-01-14 DIAGNOSIS — Z8669 Personal history of other diseases of the nervous system and sense organs: Secondary | ICD-10-CM | POA: Insufficient documentation

## 2015-01-14 DIAGNOSIS — K2970 Gastritis, unspecified, without bleeding: Secondary | ICD-10-CM

## 2015-01-14 DIAGNOSIS — E872 Acidosis: Secondary | ICD-10-CM | POA: Diagnosis present

## 2015-01-14 DIAGNOSIS — A048 Other specified bacterial intestinal infections: Secondary | ICD-10-CM | POA: Diagnosis present

## 2015-01-14 DIAGNOSIS — Z91013 Allergy to seafood: Secondary | ICD-10-CM

## 2015-01-14 DIAGNOSIS — N186 End stage renal disease: Secondary | ICD-10-CM | POA: Diagnosis present

## 2015-01-14 DIAGNOSIS — K3184 Gastroparesis: Secondary | ICD-10-CM | POA: Diagnosis present

## 2015-01-14 DIAGNOSIS — E1122 Type 2 diabetes mellitus with diabetic chronic kidney disease: Secondary | ICD-10-CM | POA: Diagnosis present

## 2015-01-14 DIAGNOSIS — E119 Type 2 diabetes mellitus without complications: Secondary | ICD-10-CM | POA: Insufficient documentation

## 2015-01-14 LAB — LIPASE, BLOOD: Lipase: 24 U/L (ref 22–51)

## 2015-01-14 LAB — CBC WITH DIFFERENTIAL/PLATELET
Basophils Absolute: 0 10*3/uL (ref 0.0–0.1)
Basophils Relative: 0 % (ref 0–1)
Eosinophils Absolute: 0 10*3/uL (ref 0.0–0.7)
Eosinophils Relative: 0 % (ref 0–5)
HCT: 40.5 % (ref 39.0–52.0)
Hemoglobin: 14.3 g/dL (ref 13.0–17.0)
Lymphocytes Relative: 22 % (ref 12–46)
Lymphs Abs: 1.7 10*3/uL (ref 0.7–4.0)
MCH: 31.6 pg (ref 26.0–34.0)
MCHC: 35.3 g/dL (ref 30.0–36.0)
MCV: 89.6 fL (ref 78.0–100.0)
Monocytes Absolute: 0.5 10*3/uL (ref 0.1–1.0)
Monocytes Relative: 6 % (ref 3–12)
Neutro Abs: 5.3 10*3/uL (ref 1.7–7.7)
Neutrophils Relative %: 72 % (ref 43–77)
Platelets: 163 10*3/uL (ref 150–400)
RBC: 4.52 MIL/uL (ref 4.22–5.81)
RDW: 12.3 % (ref 11.5–15.5)
WBC: 7.5 10*3/uL (ref 4.0–10.5)

## 2015-01-14 LAB — COMPREHENSIVE METABOLIC PANEL
ALT: 13 U/L — ABNORMAL LOW (ref 17–63)
AST: 17 U/L (ref 15–41)
Albumin: 4.1 g/dL (ref 3.5–5.0)
Alkaline Phosphatase: 40 U/L (ref 38–126)
Anion gap: 10 (ref 5–15)
BUN: 18 mg/dL (ref 6–20)
CO2: 23 mmol/L (ref 22–32)
Calcium: 9.1 mg/dL (ref 8.9–10.3)
Chloride: 103 mmol/L (ref 101–111)
Creatinine, Ser: 1.26 mg/dL — ABNORMAL HIGH (ref 0.61–1.24)
GFR calc Af Amer: >60 mL/min (ref >60)
GFR calc non Af Amer: >60 mL/min (ref >60)
Glucose, Bld: 216 mg/dL — ABNORMAL HIGH (ref 65–99)
Potassium: 4 mmol/L (ref 3.5–5.1)
Sodium: 136 mmol/L (ref 135–145)
Total Bilirubin: 1 mg/dL (ref 0.3–1.2)
Total Protein: 7.4 g/dL (ref 6.5–8.1)

## 2015-01-14 LAB — URINALYSIS, ROUTINE W REFLEX MICROSCOPIC
Bilirubin Urine: NEGATIVE
Glucose, UA: >1000 mg/dL — AB
Ketones, ur: 15 mg/dL — AB
Leukocytes, UA: NEGATIVE
Nitrite: NEGATIVE
Protein, ur: 100 mg/dL — AB
Specific Gravity, Urine: 1.015 (ref 1.005–1.030)
Urobilinogen, UA: 0.2 mg/dL (ref 0.0–1.0)
pH: 7.5 (ref 5.0–8.0)

## 2015-01-14 LAB — I-STAT CG4 LACTIC ACID, ED: Lactic Acid, Venous: 1.48 mmol/L (ref 0.5–2.0)

## 2015-01-14 LAB — URINE MICROSCOPIC-ADD ON

## 2015-01-14 MED ORDER — PANTOPRAZOLE SODIUM 40 MG IV SOLR
INTRAVENOUS | Status: AC
  Filled 2015-01-14: qty 40

## 2015-01-14 MED ORDER — METOCLOPRAMIDE HCL 10 MG PO TABS: 10.0000 mg | ORAL_TABLET | Freq: Four times a day (QID) | ORAL | Status: DC | PRN

## 2015-01-14 MED ORDER — SODIUM CHLORIDE 0.9 % IV BOLUS (SEPSIS)
1000.0000 mL | Freq: Once | INTRAVENOUS | Status: AC
  Administered 2015-01-14: 1000 mL via INTRAVENOUS

## 2015-01-14 MED ORDER — IOHEXOL 300 MG/ML  SOLN: 50.0000 mL | Freq: Once | INTRAMUSCULAR | Status: AC | PRN

## 2015-01-14 MED ORDER — HYDROMORPHONE HCL 2 MG/ML IJ SOLN
INTRAMUSCULAR | Status: AC
  Filled 2015-01-14: qty 1

## 2015-01-14 MED ORDER — HYDROMORPHONE HCL 1 MG/ML IJ SOLN
1.0000 mg | Freq: Once | INTRAMUSCULAR | Status: AC
  Administered 2015-01-14: 1 mg via INTRAVENOUS
  Filled 2015-01-14: qty 1

## 2015-01-14 MED ORDER — RANITIDINE HCL 150 MG PO TABS: 150.0000 mg | ORAL_TABLET | Freq: Two times a day (BID) | ORAL | Status: DC

## 2015-01-14 MED ORDER — METOCLOPRAMIDE HCL 5 MG/ML IJ SOLN
10.0000 mg | Freq: Once | INTRAMUSCULAR | Status: AC
  Administered 2015-01-14: 5 mg via INTRAVENOUS

## 2015-01-14 MED ORDER — METOCLOPRAMIDE HCL 5 MG/ML IJ SOLN
10.0000 mg | Freq: Once | INTRAMUSCULAR | Status: AC
  Administered 2015-01-14: 10 mg via INTRAVENOUS
  Filled 2015-01-14: qty 2

## 2015-01-14 MED ORDER — PANTOPRAZOLE SODIUM 40 MG IV SOLR
40.0000 mg | Freq: Once | INTRAVENOUS | Status: AC
  Administered 2015-01-14: 40 mg via INTRAVENOUS

## 2015-01-14 MED ORDER — HYDROMORPHONE HCL 2 MG/ML IJ SOLN
2.0000 mg | Freq: Once | INTRAMUSCULAR | Status: AC
  Administered 2015-01-14: 2 mg via INTRAVENOUS

## 2015-01-14 MED ORDER — METOCLOPRAMIDE HCL 5 MG/ML IJ SOLN
INTRAMUSCULAR | Status: AC
  Filled 2015-01-14: qty 2

## 2015-01-14 NOTE — ED Notes (Signed)
Family states pt has been having abdominal pain for a week. States he was a [facility] hospital on Friday and discharged yesterday. Family states abdominal pain is getting worse. Pt moaning, grabbing his stomach and rolling about in bed

## 2015-01-14 NOTE — ED Notes (Signed)
Pt c/o abdominal pain that has gotten worse during the day

## 2015-01-14 NOTE — ED Notes (Signed)
Patient standing at the end of the bed using urinal at this time. NAD noted.

## 2015-01-14 NOTE — Discharge Instructions (Signed)

## 2015-01-14 NOTE — ED Provider Notes (Signed)
CSN: [id number]     Arrival date & time 01/14/15  1115 History  This chart was scribed for Orpah Greek, MD by Hansel Feinstein, ED Scribe. This patient was seen in room APA16A/APA16A and the patient's care was started at 11:24 AM.      Chief Complaint  Patient presents with  . Abdominal Pain   The history is provided by the patient and a relative. No language interpreter was used.    HPI Comments: Timothy Clark is a 44 y.o. male who presents to the Emergency Department complaining of constant, moderate abdominal pain onset one week ago. Family reports associated nausea and multiple episodes of vomiting. Per family member, he was seen at [facility] with the same symptoms and was released yesterday. He has been treated for H. Pylori with relief of symptoms, but he ran out of medicine. He has a Hydrogen breath test scheduled for 01/15/15. Pt was seen in the ED on 01/11/15 for similar symptoms and was referred to Dr. Arnoldo Morale to talk about cholecystectomy. He was sent home without a GI cocktail when his symptoms improved. Pt is allergic to Bactrim, Aspirin, Doxycycline, Penicillin, Phenergan, and Zofran.   Past Medical History  Diagnosis Date  . Type 2 diabetes mellitus with complications     diagnosed around age 59  . Glaucoma   . Eye hemorrhage   . Gastritis   . GERD (gastroesophageal reflux disease)   . Esophagitis   . Hiccups   . Helicobacter pylori (H. pylori) infection   . Hypertension    Past Surgical History  Procedure Laterality Date  . Eye surgery    . Esophagogastroduodenoscopy  2015    Dr. Britta Mccreedy  . Esophagogastroduodenoscopy N/A 10/26/2014    RMR: Distal esophagititis-likely reflux related although an element of pill induced injuruy no exclueded  status post biopsy. Diffusely abnormal gastric mucosa of uncertain signigicane -status post gastric biopsy. Focal area of excoriation in the cardia most consistant with a trauma of heaving.    Family History   Problem Relation Age of Onset  . Ovarian cancer Mother   . Cervical cancer Sister   . Diabetes Sister   . Colon cancer Neg Hx    History  Substance Use Topics  . Smoking status: Never Smoker   . Smokeless tobacco: Never Used  . Alcohol Use: No    Review of Systems  Gastrointestinal: Positive for nausea, vomiting and abdominal pain.  All other systems reviewed and are negative.  Allergies  Fish allergy; Aspirin; Bactrim; Doxycycline; Penicillins; Phenergan; and Zofran  Home Medications   Prior to Admission medications   Medication Sig Start Date End Date Taking? Authorizing Provider  alum & mag hydroxide-simeth (MAALOX/MYLANTA) 200-200-20 MG/5ML suspension Take 30 mLs by mouth every 6 (six) hours as needed for indigestion or heartburn.   Yes Historical Provider, MD  brimonidine-timolol (COMBIGAN) 0.2-0.5 % ophthalmic solution Place 1 drop into both eyes at bedtime.   Yes Historical Provider, MD  Dapagliflozin Propanediol (FARXIGA) 10 MG TABS Take 10 mg by mouth daily.   Yes Historical Provider, MD  gabapentin (NEURONTIN) 600 MG tablet Take 600 mg by mouth daily as needed (for neuropathy).    Yes Historical Provider, MD  hydroxypropyl methylcellulose / hypromellose (ISOPTO TEARS / GONIOVISC) 2.5 % ophthalmic solution Place 1 drop into both eyes as needed for dry eyes.   Yes Historical Provider, MD  insulin aspart protamine- aspart (NOVOLOG MIX 70/30) (70-30) 100 UNIT/ML injection Inject 35 Units into the skin  3 (three) times daily.   Yes Historical Provider, MD  insulin detemir (LEVEMIR) 100 UNIT/ML injection Inject 30 Units into the skin at bedtime.   Yes Historical Provider, MD  lisinopril (PRINIVIL,ZESTRIL) 10 MG tablet Take 10 mg by mouth daily.   Yes Historical Provider, MD  ondansetron (ZOFRAN) 4 MG tablet Take 4 mg by mouth every 6 (six) hours as needed for nausea or vomiting.   Yes Historical Provider, MD  pantoprazole (PROTONIX) 40 MG tablet Take 1 tablet (40 mg total) by  mouth 2 (two) times daily before a meal. 12/14/14  Yes Orvil Feil, NP  potassium chloride SA (K-DUR,KLOR-CON) 20 MEQ tablet Take 20 mEq by mouth 2 (two) times daily.   Yes Historical Provider, MD  sucralfate (CARAFATE) 1 G tablet Take 1 tablet (1 g total) by mouth 4 (four) times daily. 01/10/15  Yes Orvil Feil, NP  HYDROcodone-acetaminophen (NORCO/VICODIN) 5-325 MG per tablet Take 1 tablet by mouth every 6 (six) hours as needed for moderate pain. 01/11/15   Milton Ferguson, MD  metoCLOPramide (REGLAN) 10 MG tablet Take 1 tablet (10 mg total) by mouth 3 (three) times daily before meals. 01/16/15   Kathie Dike, MD  prochlorperazine (COMPAZINE) 10 MG tablet Take 1 tablet (10 mg total) by mouth every 6 (six) hours as needed for nausea or vomiting. Patient not taking: Reported on 01/10/2015 10/29/14   Samuella Cota, MD  ranitidine (ZANTAC) 150 MG tablet Take 1 tablet (150 mg total) by mouth 2 (two) times daily. 01/14/15   Orpah Greek, MD  traMADol (ULTRAM) 50 MG tablet Take 1 tablet (50 mg total) by mouth every 6 (six) hours as needed. Patient not taking: Reported on 01/10/2015 12/12/14   Virgel Manifold, MD   BP 143/95 mmHg  Pulse 98  Temp(Src) 98.5 F (36.9 C) (Oral)  Resp 18  Ht 6' (1.829 m)  Wt 249 lb (112.946 kg)  BMI 33.76 kg/m2  SpO2 99% Physical Exam  Constitutional: He is oriented to person, place, and time. He appears well-developed and well-nourished. No distress.  HENT:  Head: Normocephalic and atraumatic.  Right Ear: Hearing normal.  Left Ear: Hearing normal.  Nose: Nose normal.  Mouth/Throat: Oropharynx is clear and moist and mucous membranes are normal.  Eyes: Conjunctivae and EOM are normal. Pupils are equal, round, and reactive to light.  Neck: Normal range of motion. Neck supple.  Cardiovascular: Regular rhythm, S1 normal and S2 normal.  Exam reveals no gallop and no friction rub.   No murmur heard. Pulmonary/Chest: Effort normal and breath sounds normal. No respiratory  distress. He exhibits no tenderness.  Abdominal: Soft. Normal appearance and bowel sounds are normal. There is no hepatosplenomegaly. There is no tenderness. There is no rebound, no guarding, no tenderness at McBurney's point and negative Murphy's sign. No hernia.  Musculoskeletal: Normal range of motion.  Neurological: He is alert and oriented to person, place, and time. He has normal strength. No cranial nerve deficit or sensory deficit. Coordination normal. GCS eye subscore is 4. GCS verbal subscore is 5. GCS motor subscore is 6.  Skin: Skin is warm, dry and intact. No rash noted. No cyanosis.  Psychiatric: He has a normal mood and affect. His speech is normal and behavior is normal. Thought content normal.  Nursing note and vitals reviewed.   ED Course  Procedures (including critical care time) DIAGNOSTIC STUDIES: Oxygen Saturation is 100% on RA, normal by my interpretation.    COORDINATION OF CARE: 11:30 AM Discussed  treatment plan with pt at bedside and pt agreed to plan.   Labs Review Labs Reviewed  COMPREHENSIVE METABOLIC PANEL - Abnormal; Notable for the following:    Glucose, Bld 216 (*)    Creatinine, Ser 1.26 (*)    ALT 13 (*)    All other components within normal limits  CBC WITH DIFFERENTIAL/PLATELET  LIPASE, BLOOD  I-STAT CG4 LACTIC ACID, ED    Imaging Review Ct Abdomen Pelvis W Contrast  01/15/2015   CLINICAL DATA:  Upper abdominal pain for 1 week. Nausea and vomiting.  EXAM: CT ABDOMEN AND PELVIS WITH CONTRAST  TECHNIQUE: Multidetector CT imaging of the abdomen and pelvis was performed using the standard protocol following bolus administration of intravenous contrast.  CONTRAST:  143mL OMNIPAQUE IOHEXOL 300 MG/ML  SOLN  COMPARISON:  Radiographs 1 day prior.  CT 11/23/2014, 11/21/2014  FINDINGS: The included lung bases are clear.  The liver, gallbladder, spleen, pancreas, and adrenal glands are normal. Splenule noted at splenic hilum. The kidneys demonstrate symmetric  enhancement without hydronephrosis or localizing abnormality. Deep created renal collecting systems noted bilaterally.  The stomach is decompressed. There are no dilated or thickened bowel loops. The appendix is normal. No colonic wall thickening. Small to moderate volume of colonic stool. No free air, free fluid, or intra-abdominal fluid collection.  No retroperitoneal adenopathy. Abdominal aorta is normal in caliber.  In the pelvis the bladder is physiologically distended. Prostate gland is normal in size. No pelvic free fluid. No pelvic adenopathy.  There are no acute or suspicious osseous abnormalities.  IMPRESSION: No acute abnormality in the abdomen/pelvis. No change from prior exams.   Electronically Signed   By: Jeb Levering M.D.   On: 01/15/2015 01:44     EKG Interpretation   Date/Time:  Sunday January 14 2015 23:15:55 EDT Ventricular Rate:  102 PR Interval:  168 QRS Duration: 68 QT Interval:  333 QTC Calculation: 434 R Axis:   47 Text Interpretation:  Sinus tachycardia Ventricular premature complex Low  voltage, precordial leads Anteroseptal infarct, old Confirmed by Christy Gentles   MD, [place] ([id number]) on 01/14/2015 11:55:12 PM     MDM   Final diagnoses:  Abdominal pain   Patient with recurrent abdominal pain. Patient is currently under the care of gastroenterology for these symptoms. He thinks that his pain got better when he was treated for H. pylori, but it has returned. He is scheduled for hydrogen breath test tomorrow. He was treated symptomatically and will follow up with his GI doctor tomorrow as scheduled.  I personally performed the services described in this documentation, which was scribed in my presence. The recorded information has been reviewed and is accurate.   Orpah Greek, MD 01/16/15 1323

## 2015-01-14 NOTE — ED Provider Notes (Signed)
CSN: [id number]     Arrival date & time 01/14/15  2116 History  This chart was scribed for Elnora Morrison, MD by Meriel Pica, ED Scribe. This patient was seen in room APA01/APA01 and the patient's care was started 10:55 PM.   Chief Complaint  Patient presents with  . Abdominal Pain   Patient is a 44 y.o. male presenting with abdominal pain. The history is provided by the patient. No language interpreter was used.  Abdominal Pain Associated symptoms: nausea and vomiting   Associated symptoms: no chills and no fever     HPI Comments: Timothy Clark is a 44 y.o. male, H. pylori, GERD, DM, who presents to the Emergency Department complaining of progressively worsening, intermittent, sharp, tight abdominal pain with associated nausea and vomiting. Pt is in distress. Mother states was recently treated for H. Pylori. She states these symptoms have been ongoing for over a week and the pt has been [facility] 4 times in the past week. He was seen in [facility] ED earlier today  He denies fevers, chills, blood in stool, history of cardiac pathologies, smoking tobacco/marijuana abuse, or alcohol abuse.   Emesis on the floor upon entering room    Past Medical History  Diagnosis Date  . Type 2 diabetes mellitus with complications     diagnosed around age 8  . Glaucoma   . Eye hemorrhage   . Gastritis   . GERD (gastroesophageal reflux disease)   . Esophagitis   . Hiccups   . Helicobacter pylori (H. pylori) infection   . Hypertension    Past Surgical History  Procedure Laterality Date  . Eye surgery    . Esophagogastroduodenoscopy  2015    Dr. Britta Mccreedy  . Esophagogastroduodenoscopy N/A 10/26/2014    RMR: Distal esophagititis-likely reflux related although an element of pill induced injuruy no exclueded  status post biopsy. Diffusely abnormal gastric mucosa of uncertain signigicane -status post gastric biopsy. Focal area of excoriation in the cardia most consistant with  a trauma of heaving.    Family History  Problem Relation Age of Onset  . Ovarian cancer Mother   . Cervical cancer Sister   . Diabetes Sister   . Colon cancer Neg Hx    History  Substance Use Topics  . Smoking status: Never Smoker   . Smokeless tobacco: Never Used  . Alcohol Use: No    Review of Systems  Constitutional: Negative for fever and chills.  Gastrointestinal: Positive for nausea, vomiting and abdominal pain. Negative for blood in stool.  All other systems reviewed and are negative.     Allergies  Fish allergy; Aspirin; Bactrim; Doxycycline; Penicillins; Phenergan; and Zofran  Home Medications   Prior to Admission medications   Medication Sig Start Date End Date Taking? Authorizing Provider  alum & mag hydroxide-simeth (MAALOX/MYLANTA) 200-200-20 MG/5ML suspension Take 30 mLs by mouth every 6 (six) hours as needed for indigestion or heartburn.   Yes Historical Provider, MD  brimonidine-timolol (COMBIGAN) 0.2-0.5 % ophthalmic solution Place 1 drop into both eyes at bedtime.   Yes Historical Provider, MD  Dapagliflozin Propanediol (FARXIGA) 10 MG TABS Take 10 mg by mouth daily.   Yes Historical Provider, MD  gabapentin (NEURONTIN) 600 MG tablet Take 600 mg by mouth daily as needed (for neuropathy).    Yes Historical Provider, MD  insulin aspart protamine- aspart (NOVOLOG MIX 70/30) (70-30) 100 UNIT/ML injection Inject 35 Units into the skin 3 (three) times daily.   Yes Historical Provider,  MD  insulin detemir (LEVEMIR) 100 UNIT/ML injection Inject 30 Units into the skin at bedtime.   Yes Historical Provider, MD  lisinopril (PRINIVIL,ZESTRIL) 10 MG tablet Take 10 mg by mouth daily.   Yes Historical Provider, MD  metoCLOPramide (REGLAN) 10 MG tablet Take 1 tablet (10 mg total) by mouth every 6 (six) hours as needed for nausea (nausea/headache). 01/14/15  Yes Orpah Greek, MD  ondansetron (ZOFRAN) 4 MG tablet Take 4 mg by mouth every 6 (six) hours as needed for  nausea or vomiting.   Yes Historical Provider, MD  pantoprazole (PROTONIX) 40 MG tablet Take 1 tablet (40 mg total) by mouth 2 (two) times daily before a meal. 12/14/14  Yes Orvil Feil, NP  potassium chloride SA (K-DUR,KLOR-CON) 20 MEQ tablet Take 20 mEq by mouth 2 (two) times daily.   Yes Historical Provider, MD  ranitidine (ZANTAC) 150 MG tablet Take 1 tablet (150 mg total) by mouth 2 (two) times daily. 01/14/15  Yes Orpah Greek, MD  sucralfate (CARAFATE) 1 G tablet Take 1 tablet (1 g total) by mouth 4 (four) times daily. 01/10/15  Yes Orvil Feil, NP  HYDROcodone-acetaminophen (NORCO/VICODIN) 5-325 MG per tablet Take 1 tablet by mouth every 6 (six) hours as needed for moderate pain. 01/11/15   Milton Ferguson, MD  hydroxypropyl methylcellulose / hypromellose (ISOPTO TEARS / GONIOVISC) 2.5 % ophthalmic solution Place 1 drop into both eyes as needed for dry eyes.    Historical Provider, MD  prochlorperazine (COMPAZINE) 10 MG tablet Take 1 tablet (10 mg total) by mouth every 6 (six) hours as needed for nausea or vomiting. Patient not taking: Reported on 01/10/2015 10/29/14   Samuella Cota, MD  traMADol (ULTRAM) 50 MG tablet Take 1 tablet (50 mg total) by mouth every 6 (six) hours as needed. Patient not taking: Reported on 01/10/2015 12/12/14   Virgel Manifold, MD   BP 135/82 mmHg  Pulse 90  Temp(Src) 98.8 F (37.1 C) (Oral)  Resp 20  Ht 6' (1.829 m)  Wt 249 lb (112.946 kg)  BMI 33.76 kg/m2  SpO2 100%  Physical Exam  Constitutional: He is oriented to person, place, and time. He appears well-developed and well-nourished. No distress.  Emesis on floor by bed.    HENT:  Head: Normocephalic.  Right Ear: External ear normal.  Left Ear: External ear normal.  Mouth/Throat: No oropharyngeal exudate.  Eyes: Pupils are equal, round, and reactive to light. Right eye exhibits no discharge. Left eye exhibits no discharge. No scleral icterus.  Sig dry mucous mem Pupils equal   Neck: Neck supple. No  JVD present.  Cardiovascular: Normal rate, regular rhythm and normal heart sounds.   Pulmonary/Chest: Effort normal and breath sounds normal. No respiratory distress.  Upper respiratory transmission Anterior lung fields clear.   Abdominal: There is tenderness.  No peritonitis.  Epigastric tenderness.   Musculoskeletal: He exhibits no edema.  Lymphadenopathy:    He has no cervical adenopathy.  Neurological: He is alert and oriented to person, place, and time.  Skin: Skin is warm and dry. No rash noted. No erythema. No pallor.  Chronic leg lessions bilaterally. No leg swelling.   Psychiatric: He has a normal mood and affect. His behavior is normal.  Nursing note and vitals reviewed.   ED Course  Procedures  DIAGNOSTIC STUDIES: Oxygen Saturation is 100% on RA, normal by my interpretation.    COORDINATION OF CARE: 10:58 PM Discussed treatment plan which includes diagnostic imaging and pain medication  with pt. Pt acknowledges and agrees to plan.   Labs Review Labs Reviewed  URINALYSIS, ROUTINE W REFLEX MICROSCOPIC (NOT AT [facility]) - Abnormal; Notable for the following:    Glucose, UA >1000 (*)    Hgb urine dipstick SMALL (*)    Ketones, ur 15 (*)    Protein, ur 100 (*)    All other components within normal limits  URINE MICROSCOPIC-ADD ON  CBC WITH DIFFERENTIAL/PLATELET  COMPREHENSIVE METABOLIC PANEL  LIPASE, BLOOD    Imaging Review Dg Abd Acute W/chest  01/14/2015   CLINICAL DATA:  Nausea, vomiting, and abdominal pain for 1 week, worsening symptom, history hypertension, type 2 diabetes, GERD, gastritis, esophagitis  EXAM: DG ABDOMEN ACUTE W/ 1V CHEST  COMPARISON:  01/12/2015  FINDINGS: Normal heart size, mediastinal contours and pulmonary vascularity.  Minimal central peribronchial thickening, chronic.  Lungs otherwise clear.  No pleural effusion or pneumothorax.  Diffuse stool throughout colon.  Nonobstructive bowel gas pattern.  No bowel dilatation, bowel wall thickening or free  intraperitoneal air.  Degenerative disc disease changes lumbar spine.  No urinary tract calcification.  IMPRESSION: No acute abnormalities.   Electronically Signed   By: Lavonia Dana M.D.   On: 01/14/2015 13:08     EKG Interpretation None      MDM   Final diagnoses:  Gastritis  Epigastric pain   Patient presents with recurrent epigastric abdominal pain recurrent vomiting. Patient has been seen multiple times in the ER and admitted at outside hospital for similar. Patient has known H. pylori. Severe sharp pain rating to the back. Patient denies alcohol or drugs. Concern for gastritis/ ulcer/ H pylori clinically vs pancreatitis.  On exam patient vomiting in the room dry mucous membranes, epigastric pain. With multiple visits and worsening symptoms plan for CT scan at work and likely observation the hospital for GI consult the morning. This will depend on how patient does and results of imaging and blood work. Patient's care be signed out to ED provider for follow-up and final disposition   Differential diagnosis were considered with the presenting HPI.  Medications  iohexol (OMNIPAQUE) 300 MG/ML solution 50 mL (not administered)  pantoprazole (PROTONIX) 40 MG injection (not administered)  sodium chloride 0.9 % bolus 1,000 mL (1,000 mLs Intravenous New Bag/Given 01/14/15 2338)  metoCLOPramide (REGLAN) injection 10 mg (10 mg Intravenous Given 01/14/15 2338)  HYDROmorphone (DILAUDID) injection 1 mg (1 mg Intravenous Given 01/14/15 2338)  pantoprazole (PROTONIX) injection 40 mg (40 mg Intravenous Given 01/14/15 2348)    Filed Vitals:   01/14/15 2120  BP: 135/82  Pulse: 90  Temp: 98.8 F (37.1 C)  TempSrc: Oral  Resp: 20  Height: 6' (1.829 m)  Weight: 249 lb (112.946 kg)  SpO2: 100%    Final diagnoses:  Gastritis  Epigastric pain    Admission/ observation were discussed with the admitting physician, patient and/or family and they are comfortable with the plan.    Elnora Morrison,  MD 01/15/15 [phone]

## 2015-01-15 ENCOUNTER — Ambulatory Visit (HOSPITAL_COMMUNITY): Admission: RE | Admit: 2015-01-15 | Payer: Medicaid Other | Source: Ambulatory Visit | Admitting: Internal Medicine

## 2015-01-15 ENCOUNTER — Encounter (HOSPITAL_COMMUNITY)
Admission: EM | Disposition: A | Discharge status: Home / Self Care | Payer: Self-pay | Source: Home / Self Care | Attending: Internal Medicine

## 2015-01-15 ENCOUNTER — Encounter (HOSPITAL_COMMUNITY): Payer: Self-pay | Admitting: *Deleted

## 2015-01-15 DIAGNOSIS — Z881 Allergy status to other antibiotic agents status: Secondary | ICD-10-CM | POA: Diagnosis not present

## 2015-01-15 DIAGNOSIS — Z880 Allergy status to penicillin: Secondary | ICD-10-CM | POA: Diagnosis not present

## 2015-01-15 DIAGNOSIS — E1143 Type 2 diabetes mellitus with diabetic autonomic (poly)neuropathy: Secondary | ICD-10-CM | POA: Diagnosis not present

## 2015-01-15 DIAGNOSIS — K2970 Gastritis, unspecified, without bleeding: Secondary | ICD-10-CM | POA: Diagnosis not present

## 2015-01-15 DIAGNOSIS — E860 Dehydration: Secondary | ICD-10-CM | POA: Diagnosis present

## 2015-01-15 DIAGNOSIS — Z794 Long term (current) use of insulin: Secondary | ICD-10-CM | POA: Diagnosis not present

## 2015-01-15 DIAGNOSIS — E118 Type 2 diabetes mellitus with unspecified complications: Secondary | ICD-10-CM

## 2015-01-15 DIAGNOSIS — Z833 Family history of diabetes mellitus: Secondary | ICD-10-CM | POA: Diagnosis not present

## 2015-01-15 DIAGNOSIS — R1013 Epigastric pain: Secondary | ICD-10-CM | POA: Diagnosis not present

## 2015-01-15 DIAGNOSIS — R112 Nausea with vomiting, unspecified: Secondary | ICD-10-CM | POA: Diagnosis not present

## 2015-01-15 DIAGNOSIS — K219 Gastro-esophageal reflux disease without esophagitis: Secondary | ICD-10-CM | POA: Diagnosis present

## 2015-01-15 DIAGNOSIS — R1110 Vomiting, unspecified: Secondary | ICD-10-CM | POA: Diagnosis not present

## 2015-01-15 DIAGNOSIS — I10 Essential (primary) hypertension: Secondary | ICD-10-CM | POA: Diagnosis present

## 2015-01-15 DIAGNOSIS — H409 Unspecified glaucoma: Secondary | ICD-10-CM | POA: Diagnosis present

## 2015-01-15 DIAGNOSIS — E1165 Type 2 diabetes mellitus with hyperglycemia: Secondary | ICD-10-CM | POA: Diagnosis present

## 2015-01-15 DIAGNOSIS — K3184 Gastroparesis: Secondary | ICD-10-CM | POA: Diagnosis present

## 2015-01-15 DIAGNOSIS — Z8619 Personal history of other infectious and parasitic diseases: Secondary | ICD-10-CM | POA: Diagnosis not present

## 2015-01-15 DIAGNOSIS — B9681 Helicobacter pylori [H. pylori] as the cause of diseases classified elsewhere: Secondary | ICD-10-CM

## 2015-01-15 DIAGNOSIS — E872 Acidosis: Secondary | ICD-10-CM | POA: Diagnosis present

## 2015-01-15 DIAGNOSIS — E876 Hypokalemia: Secondary | ICD-10-CM | POA: Diagnosis present

## 2015-01-15 DIAGNOSIS — Z886 Allergy status to analgesic agent status: Secondary | ICD-10-CM | POA: Diagnosis not present

## 2015-01-15 DIAGNOSIS — Z91013 Allergy to seafood: Secondary | ICD-10-CM | POA: Diagnosis not present

## 2015-01-15 LAB — BASIC METABOLIC PANEL
Anion gap: 9 (ref 5–15)
BUN: 16 mg/dL (ref 6–20)
CO2: 19 mmol/L — ABNORMAL LOW (ref 22–32)
Calcium: 8.3 mg/dL — ABNORMAL LOW (ref 8.9–10.3)
Chloride: 108 mmol/L (ref 101–111)
Creatinine, Ser: 1.07 mg/dL (ref 0.61–1.24)
GFR calc Af Amer: >60 mL/min (ref >60)
GFR calc non Af Amer: >60 mL/min (ref >60)
Glucose, Bld: 192 mg/dL — ABNORMAL HIGH (ref 65–99)
Potassium: 3.9 mmol/L (ref 3.5–5.1)
Sodium: 136 mmol/L (ref 135–145)

## 2015-01-15 LAB — CBC WITH DIFFERENTIAL/PLATELET
Basophils Absolute: 0 10*3/uL (ref 0.0–0.1)
Basophils Relative: 0 % (ref 0–1)
Eosinophils Absolute: 0 10*3/uL (ref 0.0–0.7)
Eosinophils Relative: 0 % (ref 0–5)
HCT: 38.3 % — ABNORMAL LOW (ref 39.0–52.0)
Hemoglobin: 13.8 g/dL (ref 13.0–17.0)
Lymphocytes Relative: 18 % (ref 12–46)
Lymphs Abs: 1.5 10*3/uL (ref 0.7–4.0)
MCH: 31.7 pg (ref 26.0–34.0)
MCHC: 36 g/dL (ref 30.0–36.0)
MCV: 88 fL (ref 78.0–100.0)
Monocytes Absolute: 0.5 10*3/uL (ref 0.1–1.0)
Monocytes Relative: 6 % (ref 3–12)
Neutro Abs: 6.2 10*3/uL (ref 1.7–7.7)
Neutrophils Relative %: 76 % (ref 43–77)
Platelets: 161 10*3/uL (ref 150–400)
RBC: 4.35 MIL/uL (ref 4.22–5.81)
RDW: 12.1 % (ref 11.5–15.5)
WBC: 8.2 10*3/uL (ref 4.0–10.5)

## 2015-01-15 LAB — COMPREHENSIVE METABOLIC PANEL
ALT: 11 U/L — ABNORMAL LOW (ref 17–63)
AST: 15 U/L (ref 15–41)
Albumin: 4 g/dL (ref 3.5–5.0)
Alkaline Phosphatase: 36 U/L — ABNORMAL LOW (ref 38–126)
Anion gap: 13 (ref 5–15)
BUN: 16 mg/dL (ref 6–20)
CO2: 18 mmol/L — ABNORMAL LOW (ref 22–32)
Calcium: 9 mg/dL (ref 8.9–10.3)
Chloride: 106 mmol/L (ref 101–111)
Creatinine, Ser: 1.08 mg/dL (ref 0.61–1.24)
GFR calc Af Amer: >60 mL/min (ref >60)
GFR calc non Af Amer: >60 mL/min (ref >60)
Glucose, Bld: 176 mg/dL — ABNORMAL HIGH (ref 65–99)
Potassium: 3.6 mmol/L (ref 3.5–5.1)
Sodium: 137 mmol/L (ref 135–145)
Total Bilirubin: 1.3 mg/dL — ABNORMAL HIGH (ref 0.3–1.2)
Total Protein: 7.3 g/dL (ref 6.5–8.1)

## 2015-01-15 LAB — GLUCOSE, CAPILLARY
Glucose-Capillary: 139 mg/dL — ABNORMAL HIGH (ref 65–99)
Glucose-Capillary: 172 mg/dL — ABNORMAL HIGH (ref 65–99)
Glucose-Capillary: 85 mg/dL (ref 65–99)
Glucose-Capillary: 126 mg/dL — ABNORMAL HIGH (ref 65–99)

## 2015-01-15 LAB — TROPONIN I: Troponin I: <0.03 ng/mL (ref <0.031)

## 2015-01-15 LAB — LIPASE, BLOOD: Lipase: 23 U/L (ref 22–51)

## 2015-01-15 SURGERY — BREATH TEST, FOR INTESTINAL BACTERIAL OVERGROWTH

## 2015-01-15 MED ORDER — METOCLOPRAMIDE HCL 5 MG/ML IJ SOLN
INTRAMUSCULAR | Status: AC
  Filled 2015-01-15: qty 2

## 2015-01-15 MED ORDER — PANTOPRAZOLE SODIUM 40 MG PO TBEC
40.0000 mg | DELAYED_RELEASE_TABLET | Freq: Two times a day (BID) | ORAL | Status: DC
  Administered 2015-01-15 – 2015-01-16 (×3): 40 mg via ORAL
  Filled 2015-01-15 (×3): qty 1

## 2015-01-15 MED ORDER — METOCLOPRAMIDE HCL 5 MG/ML IJ SOLN
10.0000 mg | Freq: Four times a day (QID) | INTRAMUSCULAR | Status: DC
  Administered 2015-01-15 – 2015-01-16 (×5): 10 mg via INTRAVENOUS
  Filled 2015-01-15 (×5): qty 2

## 2015-01-15 MED ORDER — SODIUM CHLORIDE 0.9 % IV BOLUS (SEPSIS)
1000.0000 mL | Freq: Once | INTRAVENOUS | Status: AC
  Administered 2015-01-15: 1000 mL via INTRAVENOUS

## 2015-01-15 MED ORDER — INSULIN ASPART 100 UNIT/ML ~~LOC~~ SOLN
0.0000 [IU] | Freq: Three times a day (TID) | SUBCUTANEOUS | Status: DC
  Administered 2015-01-15: 3 [IU] via SUBCUTANEOUS
  Administered 2015-01-16: 2 [IU] via SUBCUTANEOUS
  Administered 2015-01-16: 3 [IU] via SUBCUTANEOUS

## 2015-01-15 MED ORDER — HYDROCODONE-ACETAMINOPHEN 5-325 MG PO TABS: 1.0000 | ORAL_TABLET | Freq: Four times a day (QID) | ORAL | Status: DC | PRN

## 2015-01-15 MED ORDER — SODIUM CHLORIDE 0.9 % IV SOLN
INTRAVENOUS | Status: DC
  Administered 2015-01-15: 14:00:00 via INTRAVENOUS

## 2015-01-15 MED ORDER — GI COCKTAIL ~~LOC~~
30.0000 mL | Freq: Once | ORAL | Status: AC
  Administered 2015-01-15: 30 mL via ORAL
  Filled 2015-01-15: qty 30

## 2015-01-15 MED ORDER — SUCRALFATE 1 G PO TABS
1.0000 g | ORAL_TABLET | Freq: Four times a day (QID) | ORAL | Status: DC
  Administered 2015-01-15 – 2015-01-16 (×5): 1 g via ORAL
  Filled 2015-01-15 (×5): qty 1

## 2015-01-15 MED ORDER — METOCLOPRAMIDE HCL 5 MG/ML IJ SOLN
10.0000 mg | Freq: Once | INTRAMUSCULAR | Status: AC
  Administered 2015-01-15: 10 mg via INTRAVENOUS

## 2015-01-15 MED ORDER — LISINOPRIL 10 MG PO TABS
10.0000 mg | ORAL_TABLET | Freq: Every day | ORAL | Status: DC
Start: 2015-01-15 — End: 2015-01-16
  Administered 2015-01-15 – 2015-01-16 (×2): 10 mg via ORAL
  Filled 2015-01-15 (×2): qty 1

## 2015-01-15 MED ORDER — CETYLPYRIDINIUM CHLORIDE 0.05 % MT LIQD: 7.0000 mL | Freq: Two times a day (BID) | OROMUCOSAL | Status: DC

## 2015-01-15 MED ORDER — SODIUM CHLORIDE 0.9 % IJ SOLN
INTRAMUSCULAR | Status: AC
  Filled 2015-01-15: qty 750

## 2015-01-15 MED ORDER — GABAPENTIN 300 MG PO CAPS: 600.0000 mg | ORAL_CAPSULE | Freq: Every day | ORAL | Status: DC | PRN

## 2015-01-15 MED ORDER — SODIUM CHLORIDE 0.9 % IJ SOLN
INTRAMUSCULAR | Status: AC
  Filled 2015-01-15: qty 45

## 2015-01-15 MED ORDER — POTASSIUM CHLORIDE CRYS ER 20 MEQ PO TBCR
20.0000 meq | EXTENDED_RELEASE_TABLET | Freq: Two times a day (BID) | ORAL | Status: DC
  Administered 2015-01-15 (×2): 20 meq via ORAL
  Filled 2015-01-15 (×2): qty 1

## 2015-01-15 MED ORDER — INSULIN DETEMIR 100 UNIT/ML ~~LOC~~ SOLN
30.0000 [IU] | Freq: Every day | SUBCUTANEOUS | Status: DC
  Administered 2015-01-15: 30 [IU] via SUBCUTANEOUS
  Filled 2015-01-15 (×2): qty 0.3

## 2015-01-15 MED ORDER — POLYVINYL ALCOHOL 1.4 % OP SOLN: 1.0000 [drp] | OPHTHALMIC | Status: DC | PRN

## 2015-01-15 MED ORDER — ONDANSETRON HCL 4 MG PO TABS: 4.0000 mg | ORAL_TABLET | Freq: Four times a day (QID) | ORAL | Status: DC | PRN

## 2015-01-15 MED ORDER — INSULIN ASPART PROT & ASPART (70-30 MIX) 100 UNIT/ML ~~LOC~~ SUSP
35.0000 [IU] | Freq: Three times a day (TID) | SUBCUTANEOUS | Status: DC
  Administered 2015-01-15: 35 [IU] via SUBCUTANEOUS
  Filled 2015-01-15: qty 10

## 2015-01-15 MED ORDER — METOCLOPRAMIDE HCL 10 MG PO TABS: 10.0000 mg | ORAL_TABLET | Freq: Four times a day (QID) | ORAL | Status: DC | PRN

## 2015-01-15 MED ORDER — HYPROMELLOSE (GONIOSCOPIC) 2.5 % OP SOLN
1.0000 [drp] | OPHTHALMIC | Status: DC | PRN
  Filled 2015-01-15: qty 15

## 2015-01-15 MED ORDER — IOHEXOL 300 MG/ML  SOLN
100.0000 mL | Freq: Once | INTRAMUSCULAR | Status: AC | PRN
  Administered 2015-01-15: 100 mL via INTRAVENOUS

## 2015-01-15 MED ORDER — ALUM & MAG HYDROXIDE-SIMETH 200-200-20 MG/5ML PO SUSP: 30.0000 mL | Freq: Four times a day (QID) | ORAL | Status: DC | PRN

## 2015-01-15 NOTE — ED Provider Notes (Signed)
Workup negative except for dehydration Pt has continued nausea and feels like he may vomit Family is requesting transfer to [facility] Will call hospitalist for admission   Ripley Fraise, MD 01/15/15 [phone]

## 2015-01-15 NOTE — Progress Notes (Signed)
Inpatient Diabetes Program Recommendations  AACE/ADA: New Consensus Statement on Inpatient Glycemic Control (2013)  Target Ranges:  Prepandial:   less than 140 mg/dL      Peak postprandial:   less than 180 mg/dL (1-2 hours)      Critically ill patients:  140 - 180 mg/dL   Results for Timothy Clark, Timothy Clark (MRN [id number]) as of 01/15/2015 08:53  Ref. Range 01/14/2015 11:54 01/14/2015 23:45 01/15/2015 05:56  Glucose Latest Ref Range: 65-99 mg/dL 216 (H) 176 (H) 192 (H)  Results for Timothy Clark, Timothy Clark (MRN [id number]) as of 01/15/2015 08:53  Ref. Range 10/19/2009 16:29  Hemoglobin A1C Latest Ref Range: 4.6-6.5 % 12.7 (H)   Diabetes history: DM2 Outpatient Diabetes medications: Farxiga 10 mg daily, 70/30 35 units TID with meals, Levemir 30 units QHS Current orders for Inpatient glycemic control: Levemir 30 units QHS, 70/30 35 units TID with meals  Inpatient Diabetes Program Recommendations Insulin - Basal: Patient was admitted on 01/14/15 and no Levemir or 70/30 has been given since being admitted and fasting glucose is 192 mg/dl this morning. Patient is currently NPO. If patient continues to be NPO, recommend discontinuing 70/30 and continue Levemir for basal needs. Correction (SSI): While inpatient, please consider ordering Novolog moderate correction scale ACHS. HgbA1C: Please consider adding an A1C to blood in the lab to evaluate glycemic control over the past 2-3 months.  Thanks, Barnie Alderman, RN, MSN, CCRN, CDE Diabetes Coordinator Inpatient Diabetes Program [phone] (Team Pager from [place] to [place]) [phone] ([facility] office) [phone] [facility] office) [phone] [facility] office)

## 2015-01-15 NOTE — ED Notes (Signed)
Patient now has the hiccups which is helping to keep him awake.

## 2015-01-15 NOTE — H&P (Signed)
PCP:   Tawni Carnes, PA-C   Chief Complaint:  abd pain  HPI: 44 yo male h/o recent diagnosis of h pylori with gastritis treated with 1 week course of abx on 11/27/14.  He reports his abdominal pain had resolved while he was on the clarithromycin, flagyl and protonix (on this regimen due to mult allergies) for several weeks, than over the last 2 weeks the epigastric abdominal pain has returned with vomiting leading to several ED visits.  He is still on the protonix bid, and he is taking this.  Pt reports a GI cocktail usually resolves his pain.  He is also taking his carafate qid.  He requested a gi cocktail in the ED, but was given several rounds of dilaudid, reglan, and protonix.  I gave him a gi cocktail about 30 minutes ago, and his pain is almost completely resolved.  He is scheduled for hydrogen breathe test at 7am today.  He has not had any repeat upper endoscopy yet.  He has had several outpatient appts with GI over the last several months.  Review of Systems:  Positive and negative as per HPI otherwise all other systems are negative  Past Medical History: Past Medical History  Diagnosis Date  . Type 2 diabetes mellitus with complications     diagnosed around age 61  . Glaucoma   . Eye hemorrhage   . Gastritis   . GERD (gastroesophageal reflux disease)   . Esophagitis   . Hiccups   . Helicobacter pylori (H. pylori) infection   . Hypertension    Past Surgical History  Procedure Laterality Date  . Eye surgery    . Esophagogastroduodenoscopy  2015    Dr. Britta Mccreedy  . Esophagogastroduodenoscopy N/A 10/26/2014    RMR: Distal esophagititis-likely reflux related although an element of pill induced injuruy no exclueded  status post biopsy. Diffusely abnormal gastric mucosa of uncertain signigicane -status post gastric biopsy. Focal area of excoriation in the cardia most consistant with a trauma of heaving.     Medications: Prior to Admission medications   Medication Sig Start Date  End Date Taking? Authorizing Provider  alum & mag hydroxide-simeth (MAALOX/MYLANTA) 200-200-20 MG/5ML suspension Take 30 mLs by mouth every 6 (six) hours as needed for indigestion or heartburn.   Yes Historical Provider, MD  brimonidine-timolol (COMBIGAN) 0.2-0.5 % ophthalmic solution Place 1 drop into both eyes at bedtime.   Yes Historical Provider, MD  Dapagliflozin Propanediol (FARXIGA) 10 MG TABS Take 10 mg by mouth daily.   Yes Historical Provider, MD  gabapentin (NEURONTIN) 600 MG tablet Take 600 mg by mouth daily as needed (for neuropathy).    Yes Historical Provider, MD  insulin aspart protamine- aspart (NOVOLOG MIX 70/30) (70-30) 100 UNIT/ML injection Inject 35 Units into the skin 3 (three) times daily.   Yes Historical Provider, MD  insulin detemir (LEVEMIR) 100 UNIT/ML injection Inject 30 Units into the skin at bedtime.   Yes Historical Provider, MD  lisinopril (PRINIVIL,ZESTRIL) 10 MG tablet Take 10 mg by mouth daily.   Yes Historical Provider, MD  metoCLOPramide (REGLAN) 10 MG tablet Take 1 tablet (10 mg total) by mouth every 6 (six) hours as needed for nausea (nausea/headache). 01/14/15  Yes Orpah Greek, MD  ondansetron (ZOFRAN) 4 MG tablet Take 4 mg by mouth every 6 (six) hours as needed for nausea or vomiting.   Yes Historical Provider, MD  pantoprazole (PROTONIX) 40 MG tablet Take 1 tablet (40 mg total) by mouth 2 (two) times daily before a  meal. 12/14/14  Yes Orvil Feil, NP  potassium chloride SA (K-DUR,KLOR-CON) 20 MEQ tablet Take 20 mEq by mouth 2 (two) times daily.   Yes Historical Provider, MD  ranitidine (ZANTAC) 150 MG tablet Take 1 tablet (150 mg total) by mouth 2 (two) times daily. 01/14/15  Yes Orpah Greek, MD  sucralfate (CARAFATE) 1 G tablet Take 1 tablet (1 g total) by mouth 4 (four) times daily. 01/10/15  Yes Orvil Feil, NP  HYDROcodone-acetaminophen (NORCO/VICODIN) 5-325 MG per tablet Take 1 tablet by mouth every 6 (six) hours as needed for moderate pain.  01/11/15   Milton Ferguson, MD  hydroxypropyl methylcellulose / hypromellose (ISOPTO TEARS / GONIOVISC) 2.5 % ophthalmic solution Place 1 drop into both eyes as needed for dry eyes.    Historical Provider, MD  prochlorperazine (COMPAZINE) 10 MG tablet Take 1 tablet (10 mg total) by mouth every 6 (six) hours as needed for nausea or vomiting. Patient not taking: Reported on 01/10/2015 10/29/14   Samuella Cota, MD  traMADol (ULTRAM) 50 MG tablet Take 1 tablet (50 mg total) by mouth every 6 (six) hours as needed. Patient not taking: Reported on 01/10/2015 12/12/14   Virgel Manifold, MD    Allergies:   Allergies  Allergen Reactions  . Fish Allergy Anaphylaxis, Hives and Rash  . Aspirin Hives and Itching  . Bactrim [Sulfamethoxazole-Trimethoprim] Itching  . Doxycycline Hives and Itching  . Penicillins Hives and Itching  . Phenergan [Promethazine Hcl] Nausea And Vomiting  . Zofran [Ondansetron Hcl] Other (See Comments)    Hiccups     Social History:  reports that he has never smoked. He has never used smokeless tobacco. He reports that he does not drink alcohol or use illicit drugs.  Family History: Family History  Problem Relation Age of Onset  . Ovarian cancer Mother   . Cervical cancer Sister   . Diabetes Sister   . Colon cancer Neg Hx     Physical Exam: Filed Vitals:   01/15/15 0024 01/15/15 0030 01/15/15 0130 01/15/15 0200  BP:  172/89 161/97 168/103  Pulse: 101 92 106 104  Temp:      TempSrc:      Resp: 15 33 16 9  Height:      Weight:      SpO2: 100% 100% 100% 100%   General appearance: alert, cooperative and no distress Head: Normocephalic, without obvious abnormality, atraumatic Eyes: negative Nose: Nares normal. Septum midline. Mucosa normal. No drainage or sinus tenderness. Neck: no JVD and supple, symmetrical, trachea midline Lungs: clear to auscultation bilaterally Heart: regular rate and rhythm, S1, S2 normal, no murmur, click, rub or gallop Abdomen: soft,  non-tender; bowel sounds normal; no masses,  no organomegaly Extremities: extremities normal, atraumatic, no cyanosis or edema Pulses: 2+ and symmetric Skin: Skin color, texture, turgor normal. No rashes or lesions Neurologic: Grossly normal    Labs on Admission:   Recent Labs  01/14/15 1154 01/14/15 2345  NA 136 137  K 4.0 3.6  CL 103 106  CO2 23 18*  GLUCOSE 216* 176*  BUN 18 16  CREATININE 1.26* 1.08  CALCIUM 9.1 9.0    Recent Labs  01/14/15 1154 01/14/15 2345  AST 17 15  ALT 13* 11*  ALKPHOS 40 36*  BILITOT 1.0 1.3*  PROT 7.4 7.3  ALBUMIN 4.1 4.0    Recent Labs  01/14/15 1154 01/14/15 2345  LIPASE 24 23    Recent Labs  01/14/15 1154 01/14/15 2345  WBC  7.5 8.2  NEUTROABS 5.3 6.2  HGB 14.3 13.8  HCT 40.5 38.3*  MCV 89.6 88.0  PLT 163 161    Radiological Exams on Admission: Ct Abdomen Pelvis W Contrast  01/15/2015   CLINICAL DATA:  Upper abdominal pain for 1 week. Nausea and vomiting.  EXAM: CT ABDOMEN AND PELVIS WITH CONTRAST  TECHNIQUE: Multidetector CT imaging of the abdomen and pelvis was performed using the standard protocol following bolus administration of intravenous contrast.  CONTRAST:  176mL OMNIPAQUE IOHEXOL 300 MG/ML  SOLN  COMPARISON:  Radiographs 1 day prior.  CT 11/23/2014, 11/21/2014  FINDINGS: The included lung bases are clear.  The liver, gallbladder, spleen, pancreas, and adrenal glands are normal. Splenule noted at splenic hilum. The kidneys demonstrate symmetric enhancement without hydronephrosis or localizing abnormality. Deep created renal collecting systems noted bilaterally.  The stomach is decompressed. There are no dilated or thickened bowel loops. The appendix is normal. No colonic wall thickening. Small to moderate volume of colonic stool. No free air, free fluid, or intra-abdominal fluid collection.  No retroperitoneal adenopathy. Abdominal aorta is normal in caliber.  In the pelvis the bladder is physiologically distended.  Prostate gland is normal in size. No pelvic free fluid. No pelvic adenopathy.  There are no acute or suspicious osseous abnormalities.  IMPRESSION: No acute abnormality in the abdomen/pelvis. No change from prior exams.   Electronically Signed   By: Jeb Levering M.D.   On: 01/15/2015 01:44   Dg Abd Acute W/chest  01/14/2015   CLINICAL DATA:  Nausea, vomiting, and abdominal pain for 1 week, worsening symptom, history hypertension, type 2 diabetes, GERD, gastritis, esophagitis  EXAM: DG ABDOMEN ACUTE W/ 1V CHEST  COMPARISON:  01/12/2015  FINDINGS: Normal heart size, mediastinal contours and pulmonary vascularity.  Minimal central peribronchial thickening, chronic.  Lungs otherwise clear.  No pleural effusion or pneumothorax.  Diffuse stool throughout colon.  Nonobstructive bowel gas pattern.  No bowel dilatation, bowel wall thickening or free intraperitoneal air.  Degenerative disc disease changes lumbar spine.  No urinary tract calcification.  IMPRESSION: No acute abnormalities.   Electronically Signed   By: Lavonia Dana M.D.   On: 01/14/2015 13:08    Assessment/Plan  44 yo male with acute on chronic epigastric abdominal pain with h/o h pylori/esophagitis  Principal Problem:   Intractable vomiting with nausea-  Relieved with GI cocktail.  If pt continues to do well over the next several hours, consider discharging home if he tolerates advancing his diet. Repeat bmp this am, was mildly acidotic from dehydration but has received well over 2 liters of ivf.   Active Problems:   Diabetes mellitus type 2 with complications, uncontrolled   Abdominal pain, epigastric   H. pylori infection   Essential hypertension  obs on medical.  Full code.  Will check to see if he can still have his breathe test this am.   Shelbi Vaccaro A 01/15/2015, 2:37 AM

## 2015-01-15 NOTE — Consult Note (Signed)
Referring Provider: Dr. Roderic Palau Primary Care Physician:  Jeri Modena Primary Gastroenterologist:  Dr. Gala Romney   Date of Admission: 01/14/15 Date of Consultation: 01/15/15  Reason for Consultation:  Intractable nausea, vomiting, epigastric pain  HPI:  Timothy Clark is a 44 y.o. year old male with a  history of H.pylori gastritis (EGD March 2016) and multiple drug allergies requiring treatment with Protonix, clarithromycin, and Flagyl. He is well known to our office with a history of chronic abdominal pain, seen at [facility] and [facility] multiple times. He has been followed by our practice as an outpatient, most recently seen by myself on January 10, 2015. He has had multiple evaluations to include US abdomen March 2016 with stable appearance of gallbladder sludge, fatty liver. HIDA scan normal, with EF of 92%. No reproduction of symptoms. CT April 2016 moderate stool, no bowel obstruction. CT this admission unchanged from prior exams. His symptoms as an outpatient revolved around epigastric pain, which seemed to flare with certain food choices (specifically hamburger). He was admitted yesterday with intractable nausea and vomiting. Plans as an outpatient as of June 1st were to refer to Dr. Arnoldo Morale for evaluation of any component of biliary etiology, and proceed with a hydrogen breath test to complete GI work-up. H.pylori has been treated.   Went to the ED on 6/2 and was sent back home. Then went to [facility] and states he was hospitalized for 2 days. States he was told that he would need to go to [facility] for further evaluation. States he has had a total of 2 grilled chicken sandwiches since last Monday. Tolerated chicken broth just now. Epigastric pain intermittent for the past 2 years. Worsening in severity recently. States while treated for H.pylori was better than he has been in 2 years. Nausea and vomiting worsening in severity until he presented again to the ED for admission. Pain worse  after eating. Nausea intermittent, associated with hiccups. Early satiety. Grilled chicken sandwich on a bun from [place] on Saturday night. States his A1c was around 8 a few weeks ago. States prior to this was 14. States Phenergan and Zofran worsened nausea. Reglan was started this morning around 11am. Emesis is clear/bile. States after eating he gets "cold" and breaks out into a sweat.     Past Medical History  Diagnosis Date  . Type 2 diabetes mellitus with complications     diagnosed around age 32  . Glaucoma   . Eye hemorrhage   . Gastritis   . GERD (gastroesophageal reflux disease)   . Esophagitis   . Hiccups   . Helicobacter pylori (H. pylori) infection   . Hypertension     Past Surgical History  Procedure Laterality Date  . Eye surgery    . Esophagogastroduodenoscopy  2015    Dr. Britta Mccreedy  . Esophagogastroduodenoscopy N/A 10/26/2014    RMR: Distal esophagititis-likely reflux related although an element of pill induced injuruy no exclueded  status post biopsy. Diffusely abnormal gastric mucosa of uncertain signigicane -status post gastric biopsy. Focal area of excoriation in the cardia most consistant with a trauma of heaving.     Prior to Admission medications   Medication Sig Start Date End Date Taking? Authorizing Provider  alum & mag hydroxide-simeth (MAALOX/MYLANTA) 200-200-20 MG/5ML suspension Take 30 mLs by mouth every 6 (six) hours as needed for indigestion or heartburn.   Yes Historical Provider, MD  brimonidine-timolol (COMBIGAN) 0.2-0.5 % ophthalmic solution Place 1 drop into both eyes at bedtime.   Yes  Historical Provider, MD  Dapagliflozin Propanediol (FARXIGA) 10 MG TABS Take 10 mg by mouth daily.   Yes Historical Provider, MD  gabapentin (NEURONTIN) 600 MG tablet Take 600 mg by mouth daily as needed (for neuropathy).    Yes Historical Provider, MD  insulin aspart protamine- aspart (NOVOLOG MIX 70/30) (70-30) 100 UNIT/ML injection Inject 35 Units into the skin 3  (three) times daily.   Yes Historical Provider, MD  insulin detemir (LEVEMIR) 100 UNIT/ML injection Inject 30 Units into the skin at bedtime.   Yes Historical Provider, MD  lisinopril (PRINIVIL,ZESTRIL) 10 MG tablet Take 10 mg by mouth daily.   Yes Historical Provider, MD  metoCLOPramide (REGLAN) 10 MG tablet Take 1 tablet (10 mg total) by mouth every 6 (six) hours as needed for nausea (nausea/headache). 01/14/15  Yes Orpah Greek, MD  ondansetron (ZOFRAN) 4 MG tablet Take 4 mg by mouth every 6 (six) hours as needed for nausea or vomiting.   Yes Historical Provider, MD  pantoprazole (PROTONIX) 40 MG tablet Take 1 tablet (40 mg total) by mouth 2 (two) times daily before a meal. 12/14/14  Yes Orvil Feil, NP  potassium chloride SA (K-DUR,KLOR-CON) 20 MEQ tablet Take 20 mEq by mouth 2 (two) times daily.   Yes Historical Provider, MD  ranitidine (ZANTAC) 150 MG tablet Take 1 tablet (150 mg total) by mouth 2 (two) times daily. 01/14/15  Yes Orpah Greek, MD  sucralfate (CARAFATE) 1 G tablet Take 1 tablet (1 g total) by mouth 4 (four) times daily. 01/10/15  Yes Orvil Feil, NP  HYDROcodone-acetaminophen (NORCO/VICODIN) 5-325 MG per tablet Take 1 tablet by mouth every 6 (six) hours as needed for moderate pain. 01/11/15   Milton Ferguson, MD  hydroxypropyl methylcellulose / hypromellose (ISOPTO TEARS / GONIOVISC) 2.5 % ophthalmic solution Place 1 drop into both eyes as needed for dry eyes.    Historical Provider, MD  prochlorperazine (COMPAZINE) 10 MG tablet Take 1 tablet (10 mg total) by mouth every 6 (six) hours as needed for nausea or vomiting. Patient not taking: Reported on 01/10/2015 10/29/14   Samuella Cota, MD  traMADol (ULTRAM) 50 MG tablet Take 1 tablet (50 mg total) by mouth every 6 (six) hours as needed. Patient not taking: Reported on 01/10/2015 12/12/14   Virgel Manifold, MD    Current Facility-Administered Medications  Medication Dose Route Frequency Provider Last Rate Last Dose  . 0.9 %   sodium chloride infusion   Intravenous Continuous Phillips Grout, MD 75 mL/hr at 01/15/15 0350    . alum & mag hydroxide-simeth (MAALOX/MYLANTA) 200-200-20 MG/5ML suspension 30 mL  30 mL Oral Q6H PRN Phillips Grout, MD      . gabapentin (NEURONTIN) capsule 600 mg  600 mg Oral Daily PRN Phillips Grout, MD      . HYDROcodone-acetaminophen (NORCO/VICODIN) 5-325 MG per tablet 1 tablet  1 tablet Oral Q6H PRN Phillips Grout, MD      . insulin aspart (novoLOG) injection 0-15 Units  0-15 Units Subcutaneous TID WC Kathie Dike, MD      . insulin detemir (LEVEMIR) injection 30 Units  30 Units Subcutaneous QHS Rachal A Shanon Brow, MD      . lisinopril (PRINIVIL,ZESTRIL) tablet 10 mg  10 mg Oral Daily Phillips Grout, MD   10 mg at 01/15/15 1115  . metoCLOPramide (REGLAN) injection 10 mg  10 mg Intravenous 4 times per day Kathie Dike, MD   10 mg at 01/15/15 1100  .  ondansetron (ZOFRAN) tablet 4 mg  4 mg Oral Q6H PRN Phillips Grout, MD      . pantoprazole (PROTONIX) EC tablet 40 mg  40 mg Oral BID AC Phillips Grout, MD   40 mg at 01/15/15 1114  . polyvinyl alcohol (LIQUIFILM TEARS) 1.4 % ophthalmic solution 1 drop  1 drop Both Eyes PRN Kathie Dike, MD      . potassium chloride SA (K-DUR,KLOR-CON) CR tablet 20 mEq  20 mEq Oral BID Phillips Grout, MD   20 mEq at 01/15/15 1115  . sucralfate (CARAFATE) tablet 1 g  1 g Oral QID Phillips Grout, MD   1 g at 01/15/15 1114    Allergies as of 01/14/2015 - Review Complete 01/14/2015  Allergen Reaction Noted  . Fish allergy Anaphylaxis, Hives, and Rash 01/14/2015  . Aspirin Hives and Itching 03/22/2009  . Bactrim [sulfamethoxazole-trimethoprim] Itching 12/06/2014  . Doxycycline Hives and Itching 03/20/2014  . Penicillins Hives and Itching 03/22/2009  . Phenergan [promethazine hcl] Nausea And Vomiting 10/27/2014  . Zofran [ondansetron hcl] Other (See Comments) 10/27/2014    Family History  Problem Relation Age of Onset  . Ovarian cancer Mother   . Cervical  cancer Sister   . Diabetes Sister   . Colon cancer Neg Hx     History   Social History  . Marital Status: Single    Spouse Name: N/A  . Number of Children: N/A  . Years of Education: N/A   Occupational History  . Not on file.   Social History Main Topics  . Smoking status: Never Smoker   . Smokeless tobacco: Never Used  . Alcohol Use: No  . Drug Use: No  . Sexual Activity: Not on file   Other Topics Concern  . Not on file   Social History Narrative    Review of Systems: As mentioned in HPI  Physical Exam: Vital signs in last 24 hours: Temp:  [98 F (36.7 C)-98.8 F (37.1 C)] 98 F (36.7 C) (06/06 0324) Pulse Rate:  [90-110] 110 (06/06 0324) Resp:  [9-33] 20 (06/06 0324) BP: (135-179)/(82-108) 164/92 mmHg (06/06 0324) SpO2:  [98 %-100 %] 100 % (06/06 0324) Weight:  [247 lb 12.8 oz (112.4 kg)-249 lb (112.946 kg)] 247 lb 12.8 oz (112.4 kg) (06/06 0324) Last BM Date: 01/14/15 General:   Alert,  Well-developed, well-nourished, pleasant and cooperative in NAD Head:  Normocephalic and atraumatic. Eyes:  Sclera clear, no icterus.   Conjunctiva pink. Ears:  Normal auditory acuity. Nose:  No deformity, discharge,  or lesions. Mouth:  No deformity or lesions, dentition normal. Lungs:  Clear throughout to auscultation.   No wheezes, crackles, or rhonchi. No acute distress. Heart:  Regular rate and rhythm; no murmurs, clicks, rubs,  or gallops. Abdomen:  Soft, mild tenderness to palpation upper abdomen but soft. No rebound or guarding.   Rectal:  Deferred  Msk:  Symmetrical without gross deformities. Normal posture. Pulses:  Normal pulses noted. Extremities:  Without clubbing or edema. Neurologic:  Alert and  oriented x4;  grossly normal neurologically. Psych:  Alert and cooperative. Normal mood and affect.  Intake/Output from previous day: 06/05 0701 - 06/06 0700 In: 146.3 [I.V.:146.3] Out: -  Intake/Output this shift: Total I/O In: 0  Out: 900  [Urine:900]  Lab Results:  Recent Labs  01/14/15 1154 01/14/15 2345  WBC 7.5 8.2  HGB 14.3 13.8  HCT 40.5 38.3*  PLT 163 161   BMET  Recent Labs  01/14/15 1154  01/14/15 2345 01/15/15 0556  NA 136 137 136  K 4.0 3.6 3.9  CL 103 106 108  CO2 23 18* 19*  GLUCOSE 216* 176* 192*  BUN 18 16 16   CREATININE 1.26* 1.08 1.07  CALCIUM 9.1 9.0 8.3*   LFT  Recent Labs  01/14/15 1154 01/14/15 2345  PROT 7.4 7.3  ALBUMIN 4.1 4.0  AST 17 15  ALT 13* 11*  ALKPHOS 40 36*  BILITOT 1.0 1.3*    Studies/Results: Ct Abdomen Pelvis W Contrast  01/15/2015   CLINICAL DATA:  Upper abdominal pain for 1 week. Nausea and vomiting.  EXAM: CT ABDOMEN AND PELVIS WITH CONTRAST  TECHNIQUE: Multidetector CT imaging of the abdomen and pelvis was performed using the standard protocol following bolus administration of intravenous contrast.  CONTRAST:  136mL OMNIPAQUE IOHEXOL 300 MG/ML  SOLN  COMPARISON:  Radiographs 1 day prior.  CT 11/23/2014, 11/21/2014  FINDINGS: The included lung bases are clear.  The liver, gallbladder, spleen, pancreas, and adrenal glands are normal. Splenule noted at splenic hilum. The kidneys demonstrate symmetric enhancement without hydronephrosis or localizing abnormality. Deep created renal collecting systems noted bilaterally.  The stomach is decompressed. There are no dilated or thickened bowel loops. The appendix is normal. No colonic wall thickening. Small to moderate volume of colonic stool. No free air, free fluid, or intra-abdominal fluid collection.  No retroperitoneal adenopathy. Abdominal aorta is normal in caliber.  In the pelvis the bladder is physiologically distended. Prostate gland is normal in size. No pelvic free fluid. No pelvic adenopathy.  There are no acute or suspicious osseous abnormalities.  IMPRESSION: No acute abnormality in the abdomen/pelvis. No change from prior exams.   Electronically Signed   By: Jeb Levering M.D.   On: 01/15/2015 01:44   Dg Abd  Acute W/chest  01/14/2015   CLINICAL DATA:  Nausea, vomiting, and abdominal pain for 1 week, worsening symptom, history hypertension, type 2 diabetes, GERD, gastritis, esophagitis  EXAM: DG ABDOMEN ACUTE W/ 1V CHEST  COMPARISON:  01/12/2015  FINDINGS: Normal heart size, mediastinal contours and pulmonary vascularity.  Minimal central peribronchial thickening, chronic.  Lungs otherwise clear.  No pleural effusion or pneumothorax.  Diffuse stool throughout colon.  Nonobstructive bowel gas pattern.  No bowel dilatation, bowel wall thickening or free intraperitoneal air.  Degenerative disc disease changes lumbar spine.  No urinary tract calcification.  IMPRESSION: No acute abnormalities.   Electronically Signed   By: Lavonia Dana M.D.   On: 01/14/2015 13:08    Impression: 45 year old male with chronic epigastric pain, nausea, and vomiting, with multiple presentations to the ED both here and at [facility]. Known history of H.pylori with last EGD March 2016, s/p treatment. Uncontrolled diabetes, with likely nausea and vomiting secondary to delayed gastric emptying. Although epigastric pain is not typical of gastroparesis, repetitive N/V could precipitate worsening of symptoms. At time of visit June 1 as an outpatient, a hydrogen breath test was ordered to conclude GI evaluation as well as referral to Dr Arnoldo Morale to assess for any possible biliary etiology as a contributor. With his symptomatology now, breath test has been cancelled and not indicated at this time. Agree with Reglan scheduled dosing; it is interesting that he notes worsening of nausea and vomiting with Zofran and Phenergan. GES could be considered once symptoms improved, but he will not tolerate at this time. Will treat empirically for likely delayed gastric emptying. Needs tight glycemic control. EGD not indicated at this time unless he fails to improve over next  48 hours. Will likely need tertiary referral if persistent abdominal pain despite supportive  measures.   Plan: Reglan scheduled before meals and at bedtime PPI BID Keep appointment with Dr. Arnoldo Morale as outpatient June 30th Consider EGD if failure to improve in next 48 hours, otherwise EGD not indicated at this time Clear liquids now, with increase as tolerated to gastroparesis diet Likely tertiary referral as outpatient if persistent symptoms despite supportive measures   Orvil Feil, ANP-BC [facility]        01/15/2015, 12:03 PM  Attending note:  Patient seen and examined. Agree with above assessment and recommendations. I suspect a significant component of his upper GI tract symptoms are related to diabetic gastroparesis.  Diaphoresis and degree of abdominal pain would be atypical. Depending on his response to Reglan empirically, further evaluation will be undertaken as outlined above. Repeat hepatic profile in am. Dr. Oneida Alar will begin seeing patient June 7 in my absence.

## 2015-01-15 NOTE — Progress Notes (Signed)
Patient admitted by Dr. Shanon Brow earlier this morning.   Patient seen and examined. He continues to vomit and retch and unable to keep anything down by mouth. He has tenderness in epigastrium, but abdomen is otherwise soft. CT abd done on admission was unremarkable.  Will request GI input for further assistance. They are familiar with his case. He follows with Dr. Gala Romney.  Kathie Dike

## 2015-01-15 NOTE — ED Notes (Signed)
Patient having decreased respiratory rate of 10 and below, stops breathing at times. Dr. Reather Converse notified and at bedside. States to hold and further narcotics.

## 2015-01-15 NOTE — Care Management Note (Signed)
Case Management Note  Patient Details  Name: Timothy Clark MRN: [id number] Date of Birth: 11-Feb-1971  Subjective/Objective:                  Pt admitted from home with intractable nausea and vomiting. Pt lives with his father and sister and will return home at discharge. Pt is independent with ADL's.  Action/Plan: No CM needs noted.  Expected Discharge Date:  01/16/15               Expected Discharge Plan:  Home/Self Care  In-House Referral:  NA  Discharge planning Services  CM Consult  Post Acute Care Choice:  NA Choice offered to:  NA  DME Arranged:    DME Agency:     HH Arranged:    HH Agency:     Status of Service:  Completed, signed off  Medicare Important Message Given:    Date Medicare IM Given:    Medicare IM give by:    Date Additional Medicare IM Given:    Additional Medicare Important Message give by:     If discussed at [place] of Stay Meetings, dates discussed:    Additional Comments:  Joylene Draft, RN 01/15/2015, 2:43 PM

## 2015-01-16 ENCOUNTER — Encounter: Payer: Self-pay | Admitting: General Practice

## 2015-01-16 ENCOUNTER — Telehealth: Payer: Self-pay | Admitting: Gastroenterology

## 2015-01-16 DIAGNOSIS — E1143 Type 2 diabetes mellitus with diabetic autonomic (poly)neuropathy: Principal | ICD-10-CM

## 2015-01-16 DIAGNOSIS — I10 Essential (primary) hypertension: Secondary | ICD-10-CM

## 2015-01-16 LAB — CBC
HCT: 36.8 % — ABNORMAL LOW (ref 39.0–52.0)
Hemoglobin: 13.3 g/dL (ref 13.0–17.0)
MCH: 31.9 pg (ref 26.0–34.0)
MCHC: 36.1 g/dL — ABNORMAL HIGH (ref 30.0–36.0)
MCV: 88.2 fL (ref 78.0–100.0)
Platelets: 138 10*3/uL — ABNORMAL LOW (ref 150–400)
RBC: 4.17 MIL/uL — ABNORMAL LOW (ref 4.22–5.81)
RDW: 11.8 % (ref 11.5–15.5)
WBC: 7.4 10*3/uL (ref 4.0–10.5)

## 2015-01-16 LAB — HEPATIC FUNCTION PANEL
ALT: 10 U/L — ABNORMAL LOW (ref 17–63)
AST: 13 U/L — ABNORMAL LOW (ref 15–41)
Albumin: 3.4 g/dL — ABNORMAL LOW (ref 3.5–5.0)
Alkaline Phosphatase: 31 U/L — ABNORMAL LOW (ref 38–126)
Bilirubin, Direct: 0.2 mg/dL (ref 0.1–0.5)
Indirect Bilirubin: 0.9 mg/dL (ref 0.3–0.9)
Total Bilirubin: 1.1 mg/dL (ref 0.3–1.2)
Total Protein: 6 g/dL — ABNORMAL LOW (ref 6.5–8.1)

## 2015-01-16 LAB — GLUCOSE, CAPILLARY
Glucose-Capillary: 179 mg/dL — ABNORMAL HIGH (ref 65–99)
Glucose-Capillary: 142 mg/dL — ABNORMAL HIGH (ref 65–99)

## 2015-01-16 LAB — HEMOGLOBIN A1C
Hgb A1c MFr Bld: 7.8 % — ABNORMAL HIGH (ref 4.8–5.6)
Mean Plasma Glucose: 177 mg/dL

## 2015-01-16 LAB — BASIC METABOLIC PANEL
Anion gap: 8 (ref 5–15)
BUN: 12 mg/dL (ref 6–20)
CO2: 21 mmol/L — ABNORMAL LOW (ref 22–32)
Calcium: 8.3 mg/dL — ABNORMAL LOW (ref 8.9–10.3)
Chloride: 107 mmol/L (ref 101–111)
Creatinine, Ser: 0.91 mg/dL (ref 0.61–1.24)
GFR calc Af Amer: >60 mL/min (ref >60)
GFR calc non Af Amer: >60 mL/min (ref >60)
Glucose, Bld: 73 mg/dL (ref 65–99)
Potassium: 3.1 mmol/L — ABNORMAL LOW (ref 3.5–5.1)
Sodium: 136 mmol/L (ref 135–145)

## 2015-01-16 MED ORDER — METOCLOPRAMIDE HCL 10 MG PO TABS: 10.0000 mg | ORAL_TABLET | Freq: Three times a day (TID) | ORAL | Status: DC

## 2015-01-16 MED ORDER — POTASSIUM CHLORIDE CRYS ER 20 MEQ PO TBCR: 20.0000 meq | EXTENDED_RELEASE_TABLET | Freq: Two times a day (BID) | ORAL | Status: DC

## 2015-01-16 MED ORDER — POTASSIUM CHLORIDE CRYS ER 20 MEQ PO TBCR
40.0000 meq | EXTENDED_RELEASE_TABLET | ORAL | Status: AC
  Administered 2015-01-16 (×2): 40 meq via ORAL
  Filled 2015-01-16 (×2): qty 2

## 2015-01-16 NOTE — Progress Notes (Signed)
Subjective: Last episode of vomiting yesterday morning, pain resolved. Tolerated oatmeal. Nausea resolved. Stomach "finally feels free" on the Reglan.   Objective: Vital signs in last 24 hours: Temp:  [98 F (36.7 C)-99.5 F (37.5 C)] 98 F (36.7 C) (06/07 0607) Pulse Rate:  [85-107] 85 (06/07 0607) Resp:  [20] 20 (06/07 0607) BP: (139-166)/(72-93) 166/85 mmHg (06/07 0607) SpO2:  [99 %-100 %] 99 % (06/07 0607) Weight:  [247 lb 4.8 oz (112.175 kg)] 247 lb 4.8 oz (112.175 kg) (06/07 0607) Last BM Date: 01/15/15 General:   Alert and oriented, pleasant Head:  Normocephalic and atraumatic. Eyes:  No icterus, sclera clear. Conjuctiva pink.  Abdomen:  Bowel sounds present, soft, non-tender, non-distended. No HSM or hernias noted. No rebound or guarding. No masses appreciated  Extremities:  Without edema. Neurologic:  Alert and  oriented x4;  grossly normal neurologically. Psych:  Alert and cooperative. Normal mood and affect.  Intake/Output from previous day: 06/06 0701 - 06/07 0700 In: 2292.5 [P.O.:600; I.V.:1692.5] Out: 2200 [Urine:2200] Intake/Output this shift:    Lab Results:  Recent Labs  01/14/15 1154 01/14/15 2345 01/16/15 0626  WBC 7.5 8.2 7.4  HGB 14.3 13.8 13.3  HCT 40.5 38.3* 36.8*  PLT 163 161 138*   BMET  Recent Labs  01/14/15 2345 01/15/15 0556 01/16/15 0626  NA 137 136 136  K 3.6 3.9 3.1*  CL 106 108 107  CO2 18* 19* 21*  GLUCOSE 176* 192* 73  BUN 16 16 12   CREATININE 1.08 1.07 0.91  CALCIUM 9.0 8.3* 8.3*   LFT  Recent Labs  01/14/15 1154 01/14/15 2345 01/16/15 0627  PROT 7.4 7.3 6.0*  ALBUMIN 4.1 4.0 3.4*  AST 17 15 13*  ALT 13* 11* 10*  ALKPHOS 40 36* 31*  BILITOT 1.0 1.3* 1.1  BILIDIR  --   --  0.2  IBILI  --   --  0.9     Studies/Results: Ct Abdomen Pelvis W Contrast  01/15/2015   CLINICAL DATA:  Upper abdominal pain for 1 week. Nausea and vomiting.  EXAM: CT ABDOMEN AND PELVIS WITH CONTRAST  TECHNIQUE: Multidetector CT  imaging of the abdomen and pelvis was performed using the standard protocol following bolus administration of intravenous contrast.  CONTRAST:  143mL OMNIPAQUE IOHEXOL 300 MG/ML  SOLN  COMPARISON:  Radiographs 1 day prior.  CT 11/23/2014, 11/21/2014  FINDINGS: The included lung bases are clear.  The liver, gallbladder, spleen, pancreas, and adrenal glands are normal. Splenule noted at splenic hilum. The kidneys demonstrate symmetric enhancement without hydronephrosis or localizing abnormality. Deep created renal collecting systems noted bilaterally.  The stomach is decompressed. There are no dilated or thickened bowel loops. The appendix is normal. No colonic wall thickening. Small to moderate volume of colonic stool. No free air, free fluid, or intra-abdominal fluid collection.  No retroperitoneal adenopathy. Abdominal aorta is normal in caliber.  In the pelvis the bladder is physiologically distended. Prostate gland is normal in size. No pelvic free fluid. No pelvic adenopathy.  There are no acute or suspicious osseous abnormalities.  IMPRESSION: No acute abnormality in the abdomen/pelvis. No change from prior exams.   Electronically Signed   By: Jeb Levering M.D.   On: 01/15/2015 01:44   Dg Abd Acute W/chest  01/14/2015   CLINICAL DATA:  Nausea, vomiting, and abdominal pain for 1 week, worsening symptom, history hypertension, type 2 diabetes, GERD, gastritis, esophagitis  EXAM: DG ABDOMEN ACUTE W/ 1V CHEST  COMPARISON:  01/12/2015  FINDINGS:  Normal heart size, mediastinal contours and pulmonary vascularity.  Minimal central peribronchial thickening, chronic.  Lungs otherwise clear.  No pleural effusion or pneumothorax.  Diffuse stool throughout colon.  Nonobstructive bowel gas pattern.  No bowel dilatation, bowel wall thickening or free intraperitoneal air.  Degenerative disc disease changes lumbar spine.  No urinary tract calcification.  IMPRESSION: No acute abnormalities.   Electronically Signed   By:  Lavonia Dana M.D.   On: 01/14/2015 13:08    Assessment: 44 year old male admitted with acute on chronic epigastric pain, nausea, and vomiting, likely secondary to presumed diabetic gastroparesis. No GES on file as of yet, but he has responded well to scheduled Reglan before meals. EGD on file. Could consider GES as outpatient. Clinically improved since admission with resolution of abdominal pain, N/V, and appropriate for discharge home with close follow-up in our office.   Plan: Discharge home today Reglan 10 mg before meals and at bedtime PPI BID Will arrange outpatient follow-up Hold on outpatient [facility] referral unless recurrent abdominal pain    Orvil Feil, ANP-BC [facility]    LOS: 1 day    01/16/2015, 8:30 AM

## 2015-01-16 NOTE — Progress Notes (Signed)
Timothy Clark discharged Home per MD order.  Discharge instructions reviewed and discussed with the patient, all questions and concerns answered. Copy of instructions and scripts given to patient.    Medication List    STOP taking these medications        prochlorperazine 10 MG tablet  Commonly known as:  COMPAZINE     traMADol 50 MG tablet  Commonly known as:  ULTRAM      TAKE these medications        alum & mag hydroxide-simeth 200-200-20 MG/5ML suspension  Commonly known as:  MAALOX/MYLANTA  Take 30 mLs by mouth every 6 (six) hours as needed for indigestion or heartburn.     brimonidine-timolol 0.2-0.5 % ophthalmic solution  Commonly known as:  COMBIGAN  Place 1 drop into both eyes at bedtime.     FARXIGA 10 MG Tabs tablet  Generic drug:  dapagliflozin propanediol  Take 10 mg by mouth daily.     gabapentin 600 MG tablet  Commonly known as:  NEURONTIN  Take 600 mg by mouth daily as needed (for neuropathy).     HYDROcodone-acetaminophen 5-325 MG per tablet  Commonly known as:  NORCO/VICODIN  Take 1 tablet by mouth every 6 (six) hours as needed for moderate pain.     hydroxypropyl methylcellulose / hypromellose 2.5 % ophthalmic solution  Commonly known as:  ISOPTO TEARS / GONIOVISC  Place 1 drop into both eyes as needed for dry eyes.     insulin aspart protamine- aspart (70-30) 100 UNIT/ML injection  Commonly known as:  NOVOLOG MIX 70/30  Inject 35 Units into the skin 3 (three) times daily.     insulin detemir 100 UNIT/ML injection  Commonly known as:  LEVEMIR  Inject 30 Units into the skin at bedtime.     lisinopril 10 MG tablet  Commonly known as:  PRINIVIL,ZESTRIL  Take 10 mg by mouth daily.     metoCLOPramide 10 MG tablet  Commonly known as:  REGLAN  Take 1 tablet (10 mg total) by mouth 3 (three) times daily before meals.     ondansetron 4 MG tablet  Commonly known as:  ZOFRAN  Take 4 mg by mouth every 6 (six) hours as needed for nausea or vomiting.     pantoprazole 40 MG tablet  Commonly known as:  PROTONIX  Take 1 tablet (40 mg total) by mouth 2 (two) times daily before a meal.     potassium chloride SA 20 MEQ tablet  Commonly known as:  K-DUR,KLOR-CON  Take 20 mEq by mouth 2 (two) times daily.     ranitidine 150 MG tablet  Commonly known as:  ZANTAC  Take 1 tablet (150 mg total) by mouth 2 (two) times daily.     sucralfate 1 G tablet  Commonly known as:  CARAFATE  Take 1 tablet (1 g total) by mouth 4 (four) times daily.        Patients skin is clean, dry and intact, no evidence of skin break down. IV site discontinued and catheter remains intact. Site without signs and symptoms of complications. Dressing and pressure applied.  Patient escorted to car by Horris Latino in a wheelchair,  no distress noted upon discharge.  [name] 01/16/2015 12:52 PM

## 2015-01-16 NOTE — Care Management Note (Signed)
Case Management Note  Patient Details  Name: ERMAN IKNER MRN: [id number] Date of Birth: 1970-12-05  Subjective/Objective:                    Action/Plan:   Expected Discharge Date:  01/16/15               Expected Discharge Plan:  Home/Self Care  In-House Referral:  NA  Discharge planning Services  CM Consult  Post Acute Care Choice:  NA Choice offered to:  NA  DME Arranged:    DME Agency:     HH Arranged:    [place] Agency:     Status of Service:  Completed, signed off  Medicare Important Message Given:    Date Medicare IM Given:    Medicare IM give by:    Date Additional Medicare IM Given:    Additional Medicare Important Message give by:     If discussed at [place] of Stay Meetings, dates discussed:    Additional Comments: Pt discharged home today. No CM needs noted. Christinia Gully [place], RN 01/16/2015, 9:36 AM

## 2015-01-16 NOTE — Discharge Summary (Signed)
Physician Discharge Summary  Timothy Clark [id number] DOB: Jan 05, 1971 DOA: 01/14/2015  PCP: Tawni Carnes, PA-C  Admit date: 01/14/2015 Discharge date: 01/16/2015  Time spent: 40 minutes  Recommendations for Outpatient Follow-up:  1. Follow up with gastroenterology as an outpatient  Discharge Diagnoses:  Principal Problem:   Intractable vomiting with nausea Active Problems:   Diabetes mellitus type 2 with complications, uncontrolled   Abdominal pain, epigastric   H. pylori infection   Essential hypertension   Intractable vomiting   Gastritis Diabetic gastroparesis Hypokalemia Dehydration  Discharge Condition: improved  Diet recommendation: low carb, low salt  Filed Weights   01/14/15 2120 01/15/15 0324 01/16/15 0607  Weight: 112.946 kg (249 lb) 112.4 kg (247 lb 12.8 oz) 112.175 kg (247 lb 4.8 oz)    History of present illness:  This patient was admitted to the hospital with epigastric pain, persistent nausea and vomiting. Patient has had these symptoms for quite some time now, but describes progressively gotten worse. When the symptoms were not controlled in the emergency room, he was admitted for further treatments.  Hospital Course:  Patient had a CT scan of the abdomen and pelvis done in the emergency room which was found to be unremarkable. He has had an extensive workup regarding his symptoms and is followed closely by GI. He is on proton pump inhibitors and Carafate. The patient was started on intravenous Reglan which dramatically improved his symptoms. He then admitted that he had not taking his Reglan that was prescribed to him as an outpatient. It is likely that his symptoms are related to diabetic gastroparesis. He is advised to continue his Reglan as an outpatient. He'll follow up with gastroenterology as an outpatient. If his symptoms recur, he may need referral to a tertiary center, which will be arranged by GI. The patient is tolerating a solid diet and is ready  for discharge home. Dehydration has resolved with IV fluids.  Procedures:    Consultations:  GI  Discharge Exam: Filed Vitals:   01/16/15 0607  BP: 166/85  Pulse: 85  Temp: 98 F (36.7 C)  Resp: 20    General: NAD Cardiovascular: S1, S2 RRR Respiratory: CTA B  Discharge Instructions   Discharge Instructions    Diet - low sodium heart healthy    Complete by:  As directed      Increase activity slowly    Complete by:  As directed           Current Discharge Medication List    CONTINUE these medications which have CHANGED   Details  metoCLOPramide (REGLAN) 10 MG tablet Take 1 tablet (10 mg total) by mouth 3 (three) times daily before meals. Qty: 90 tablet, Refills: 0      CONTINUE these medications which have NOT CHANGED   Details  alum & mag hydroxide-simeth (MAALOX/MYLANTA) 200-200-20 MG/5ML suspension Take 30 mLs by mouth every 6 (six) hours as needed for indigestion or heartburn.    brimonidine-timolol (COMBIGAN) 0.2-0.5 % ophthalmic solution Place 1 drop into both eyes at bedtime.    Dapagliflozin Propanediol (FARXIGA) 10 MG TABS Take 10 mg by mouth daily.    gabapentin (NEURONTIN) 600 MG tablet Take 600 mg by mouth daily as needed (for neuropathy).     insulin aspart protamine- aspart (NOVOLOG MIX 70/30) (70-30) 100 UNIT/ML injection Inject 35 Units into the skin 3 (three) times daily.    insulin detemir (LEVEMIR) 100 UNIT/ML injection Inject 30 Units into the skin at bedtime.    lisinopril (PRINIVIL,ZESTRIL) 10  MG tablet Take 10 mg by mouth daily.    ondansetron (ZOFRAN) 4 MG tablet Take 4 mg by mouth every 6 (six) hours as needed for nausea or vomiting.    pantoprazole (PROTONIX) 40 MG tablet Take 1 tablet (40 mg total) by mouth 2 (two) times daily before a meal. Qty: 60 tablet, Refills: 3    potassium chloride SA (K-DUR,KLOR-CON) 20 MEQ tablet Take 20 mEq by mouth 2 (two) times daily.    ranitidine (ZANTAC) 150 MG tablet Take 1 tablet (150 mg  total) by mouth 2 (two) times daily. Qty: 60 tablet, Refills: 0    sucralfate (CARAFATE) 1 G tablet Take 1 tablet (1 g total) by mouth 4 (four) times daily. Qty: 120 tablet, Refills: 0    HYDROcodone-acetaminophen (NORCO/VICODIN) 5-325 MG per tablet Take 1 tablet by mouth every 6 (six) hours as needed for moderate pain. Qty: 10 tablet, Refills: 0    hydroxypropyl methylcellulose / hypromellose (ISOPTO TEARS / GONIOVISC) 2.5 % ophthalmic solution Place 1 drop into both eyes as needed for dry eyes.      STOP taking these medications     prochlorperazine (COMPAZINE) 10 MG tablet      traMADol (ULTRAM) 50 MG tablet        Allergies  Allergen Reactions  . Fish Allergy Anaphylaxis, Hives and Rash  . Aspirin Hives and Itching  . Bactrim [Sulfamethoxazole-Trimethoprim] Itching  . Doxycycline Hives and Itching  . Penicillins Hives and Itching  . Phenergan [Promethazine Hcl] Nausea And Vomiting  . Zofran [Ondansetron Hcl] Other (See Comments)    Hiccups       The results of significant diagnostics from this hospitalization (including imaging, microbiology, ancillary and laboratory) are listed below for reference.    Significant Diagnostic Studies: Ct Abdomen Pelvis W Contrast  01/15/2015   CLINICAL DATA:  Upper abdominal pain for 1 week. Nausea and vomiting.  EXAM: CT ABDOMEN AND PELVIS WITH CONTRAST  TECHNIQUE: Multidetector CT imaging of the abdomen and pelvis was performed using the standard protocol following bolus administration of intravenous contrast.  CONTRAST:  125mL OMNIPAQUE IOHEXOL 300 MG/ML  SOLN  COMPARISON:  Radiographs 1 day prior.  CT 11/23/2014, 11/21/2014  FINDINGS: The included lung bases are clear.  The liver, gallbladder, spleen, pancreas, and adrenal glands are normal. Splenule noted at splenic hilum. The kidneys demonstrate symmetric enhancement without hydronephrosis or localizing abnormality. Deep created renal collecting systems noted bilaterally.  The stomach  is decompressed. There are no dilated or thickened bowel loops. The appendix is normal. No colonic wall thickening. Small to moderate volume of colonic stool. No free air, free fluid, or intra-abdominal fluid collection.  No retroperitoneal adenopathy. Abdominal aorta is normal in caliber.  In the pelvis the bladder is physiologically distended. Prostate gland is normal in size. No pelvic free fluid. No pelvic adenopathy.  There are no acute or suspicious osseous abnormalities.  IMPRESSION: No acute abnormality in the abdomen/pelvis. No change from prior exams.   Electronically Signed   By: Jeb Levering M.D.   On: 01/15/2015 01:44   Dg Abd Acute W/chest  01/14/2015   CLINICAL DATA:  Nausea, vomiting, and abdominal pain for 1 week, worsening symptom, history hypertension, type 2 diabetes, GERD, gastritis, esophagitis  EXAM: DG ABDOMEN ACUTE W/ 1V CHEST  COMPARISON:  01/12/2015  FINDINGS: Normal heart size, mediastinal contours and pulmonary vascularity.  Minimal central peribronchial thickening, chronic.  Lungs otherwise clear.  No pleural effusion or pneumothorax.  Diffuse stool throughout colon.  Nonobstructive bowel gas pattern.  No bowel dilatation, bowel wall thickening or free intraperitoneal air.  Degenerative disc disease changes lumbar spine.  No urinary tract calcification.  IMPRESSION: No acute abnormalities.   Electronically Signed   By: Lavonia Dana M.D.   On: 01/14/2015 13:08    Microbiology: No results found for this or any previous visit (from the past 240 hour(s)).   Labs: Basic Metabolic Panel:  Recent Labs Lab 01/14/15 1154 01/14/15 2345 01/15/15 0556 01/16/15 0626  NA 136 137 136 136  K 4.0 3.6 3.9 3.1*  CL 103 106 108 107  CO2 23 18* 19* 21*  GLUCOSE 216* 176* 192* 73  BUN 18 16 16 12   CREATININE 1.26* 1.08 1.07 0.91  CALCIUM 9.1 9.0 8.3* 8.3*   Liver Function Tests:  Recent Labs Lab 01/14/15 1154 01/14/15 2345 01/16/15 0627  AST 17 15 13*  ALT 13* 11* 10*   ALKPHOS 40 36* 31*  BILITOT 1.0 1.3* 1.1  PROT 7.4 7.3 6.0*  ALBUMIN 4.1 4.0 3.4*    Recent Labs Lab 01/14/15 1154 01/14/15 2345  LIPASE 24 23   No results for input(s): AMMONIA in the last 168 hours. CBC:  Recent Labs Lab 01/14/15 1154 01/14/15 2345 01/16/15 0626  WBC 7.5 8.2 7.4  NEUTROABS 5.3 6.2  --   HGB 14.3 13.8 13.3  HCT 40.5 38.3* 36.8*  MCV 89.6 88.0 88.2  PLT 163 161 138*   Cardiac Enzymes:  Recent Labs Lab 01/15/15 0242  TROPONINI <0.03   BNP: BNP (last 3 results) No results for input(s): BNP in the last 8760 hours.  ProBNP (last 3 results) No results for input(s): PROBNP in the last 8760 hours.  CBG:  Recent Labs Lab 01/11/15 1221 01/15/15 1039 01/15/15 1137 01/15/15 1630 01/15/15 2052  GLUCAP 221* 139* 172* 85 126*       Signed:  Lakeith Careaga  [facility] 01/16/2015, 9:36 AM

## 2015-01-16 NOTE — Telephone Encounter (Signed)
Appt given to [facility] via telephone and letter placed in the mail confirming appt 6/28 at 2:30pm

## 2015-01-16 NOTE — Telephone Encounter (Signed)
Please arrange outpatient hospital follow-up in next few weeks. Thanks!

## 2015-01-24 NOTE — Progress Notes (Signed)
cc'd to pcp 

## 2015-01-30 ENCOUNTER — Other Ambulatory Visit: Payer: Self-pay

## 2015-01-31 MED ORDER — PANTOPRAZOLE SODIUM 40 MG PO TBEC: 40.0000 mg | DELAYED_RELEASE_TABLET | Freq: Two times a day (BID) | ORAL | Status: DC

## 2015-02-06 ENCOUNTER — Other Ambulatory Visit: Payer: Self-pay

## 2015-02-06 ENCOUNTER — Ambulatory Visit (INDEPENDENT_AMBULATORY_CARE_PROVIDER_SITE_OTHER): Payer: Medicaid Other | Admitting: Gastroenterology

## 2015-02-06 ENCOUNTER — Encounter: Payer: Self-pay | Admitting: Gastroenterology

## 2015-02-06 VITALS — BP 128/82 | HR 101 | Temp 98.3°F | Ht 72.0 in | Wt 258.4 lb

## 2015-02-06 DIAGNOSIS — R109 Unspecified abdominal pain: Secondary | ICD-10-CM

## 2015-02-06 DIAGNOSIS — R1013 Epigastric pain: Secondary | ICD-10-CM

## 2015-02-06 DIAGNOSIS — K3184 Gastroparesis: Secondary | ICD-10-CM

## 2015-02-06 DIAGNOSIS — E1143 Type 2 diabetes mellitus with diabetic autonomic (poly)neuropathy: Secondary | ICD-10-CM

## 2015-02-06 MED ORDER — METOCLOPRAMIDE HCL 10 MG PO TABS: 10.0000 mg | ORAL_TABLET | Freq: Three times a day (TID) | ORAL | Status: DC

## 2015-02-06 NOTE — Patient Instructions (Signed)
I have increased Reglan to before meals and at bedtime (four times a day).  Please keep appointment with Dr. Arnoldo Morale.  I have referred you to [facility] as well for further evaluation of abdominal pain.

## 2015-02-06 NOTE — Progress Notes (Signed)
Referring Provider: Tawni Carnes, PA-C Primary Care Physician:  Jeri Modena  Primary GI: Dr. Gala Romney   Chief Complaint  Patient presents with  . Follow-up    HPI:   Timothy Clark is a 44 y.o. male presenting today with a history of chronic epigastric pain, nausea, and vomiting, with multiple presentations to the ED both here and at [facility]. Known history of H.pylori with last EGD March 2016, s/p treatment. Eradication documentation not completed, as he has been unable to come off of a PPI to test for this. Uncontrolled diabetes, with likely nausea and vomiting secondary to delayed gastric emptying. Although epigastric pain is not typical of gastroparesis, repetitive N/V could precipitate worsening of symptoms. Recently inpatient due to acute on chronic N/V. Started empirically on Reglan with excellent response. Returns today in follow-up. As of note, he has been referred to Dr. Arnoldo Morale due to gallbladder sludge but normal HIDA. Unclear if any biliary etiology to epigastric pain.   Reglan 10 mg TID. Protonix BID, Carafate QID. Stabbing upper abdominal pain. Intermittent. Feels gas radiating up into esophagus, giving hiccups and burps. No pain with eating. Notes early satiety. Ate a hamburger yesterday. Recurrent symptoms last Thursday. Has had worsening of N/V with Zofran and Phenergan in past. NO EMESIS.   Starts sneezing when he drinks something cold. States his body temperature drops when he eats, has to put on a long sleeve shirt while eating. No weight loss. Scheduled to see Dr. Arnoldo Morale on June 30th. Avoids dairy products. He has actually gained weight despite his multiple complaints.   Past Medical History  Diagnosis Date  . Type 2 diabetes mellitus with complications     diagnosed around age 38  . Glaucoma   . Eye hemorrhage   . Gastritis   . GERD (gastroesophageal reflux disease)   . Esophagitis   . Hiccups   . Helicobacter pylori (H. pylori) infection   .  Hypertension     Past Surgical History  Procedure Laterality Date  . Eye surgery    . Esophagogastroduodenoscopy  2015    Dr. Britta Mccreedy  . Esophagogastroduodenoscopy N/A 10/26/2014    RMR: Distal esophagititis-likely reflux related although an element of pill induced injuruy no exclueded  status post biopsy. Diffusely abnormal gastric mucosa of uncertain signigicane -status post gastric biopsy. Focal area of excoriation in the cardia most consistant with a trauma of heaving.     Current Outpatient Prescriptions  Medication Sig Dispense Refill  . alum & mag hydroxide-simeth (MAALOX/MYLANTA) 200-200-20 MG/5ML suspension Take 30 mLs by mouth every 6 (six) hours as needed for indigestion or heartburn.    . brimonidine-timolol (COMBIGAN) 0.2-0.5 % ophthalmic solution Place 1 drop into both eyes at bedtime.    . Dapagliflozin Propanediol (FARXIGA) 10 MG TABS Take 10 mg by mouth daily.    [name] gabapentin (NEURONTIN) 600 MG tablet Take 600 mg by mouth daily as needed (for neuropathy).     . hydroxypropyl methylcellulose / hypromellose (ISOPTO TEARS / GONIOVISC) 2.5 % ophthalmic solution Place 1 drop into both eyes as needed for dry eyes.    . insulin aspart protamine- aspart (NOVOLOG MIX 70/30) (70-30) 100 UNIT/ML injection Inject 35 Units into the skin 3 (three) times daily.    . insulin detemir (LEVEMIR) 100 UNIT/ML injection Inject 30 Units into the skin at bedtime.    [name] lisinopril (PRINIVIL,ZESTRIL) 10 MG tablet Take 10 mg by mouth daily.    . metoCLOPramide (REGLAN) 10 MG tablet Take 1 tablet (  10 mg total) by mouth 3 (three) times daily before meals. 90 tablet 0  . ondansetron (ZOFRAN) 4 MG tablet Take 4 mg by mouth every 6 (six) hours as needed for nausea or vomiting.    . pantoprazole (PROTONIX) 40 MG tablet Take 1 tablet (40 mg total) by mouth 2 (two) times daily before a meal. 60 tablet 3  . potassium chloride SA (K-DUR,KLOR-CON) 20 MEQ tablet Take 20 mEq by mouth 2 (two) times daily.    .  ranitidine (ZANTAC) 150 MG tablet Take 1 tablet (150 mg total) by mouth 2 (two) times daily. 60 tablet 0  . sucralfate (CARAFATE) 1 G tablet Take 1 tablet (1 g total) by mouth 4 (four) times daily. 120 tablet 0  . HYDROcodone-acetaminophen (NORCO/VICODIN) 5-325 MG per tablet Take 1 tablet by mouth every 6 (six) hours as needed for moderate pain. (Patient not taking: Reported on 02/06/2015) 10 tablet 0   No current facility-administered medications for this visit.    Allergies as of 02/06/2015 - Review Complete 02/06/2015  Allergen Reaction Noted  . Fish allergy Anaphylaxis, Hives, and Rash 01/14/2015  . Aspirin Hives and Itching 03/22/2009  . Bactrim [sulfamethoxazole-trimethoprim] Itching 12/06/2014  . Doxycycline Hives and Itching 03/20/2014  . Penicillins Hives and Itching 03/22/2009  . Phenergan [promethazine hcl] Nausea And Vomiting 10/27/2014  . Zofran [ondansetron hcl] Other (See Comments) 10/27/2014    Family History  Problem Relation Age of Onset  . Ovarian cancer Mother   . Cervical cancer Sister   . Diabetes Sister   . Colon cancer Neg Hx     History   Social History  . Marital Status: Single    Spouse Name: N/A  . Number of Children: N/A  . Years of Education: N/A   Social History Main Topics  . Smoking status: Never Smoker   . Smokeless tobacco: Never Used  . Alcohol Use: No  . Drug Use: No  . Sexual Activity: Not on file   Other Topics Concern  . None   Social History Narrative    Review of Systems: As mentioned in HPI.   Physical Exam: BP 128/82 mmHg  Pulse 101  Temp(Src) 98.3 F (36.8 C) (Oral)  Ht 6' (1.829 m)  Wt 258 lb 6.4 oz (117.209 kg)  BMI 35.04 kg/m2 General:   Alert and oriented. No distress noted. Pleasant and cooperative.  Head:  Normocephalic and atraumatic. Eyes:  Conjuctiva clear without scleral icterus. Abdomen:  +BS, soft, mild TTP upper abdomen and non-distended. No rebound or guarding. No HSM or masses noted. Msk:   Symmetrical without gross deformities. Normal posture. Extremities:  Without edema. Neurologic:  Alert and  oriented x4;  grossly normal neurologically. Psych:  Alert and cooperative. Normal mood and affect.

## 2015-02-07 ENCOUNTER — Ambulatory Visit: Payer: Medicaid Other | Admitting: Gastroenterology

## 2015-02-09 NOTE — Assessment & Plan Note (Signed)
Without formal GES completed, but he has responded well to Reglan 10 mg TID recently during hospitalization. Now with nausea recurring but no vomiting. Increase Reglan by adding an evening dose. Continue PPI therapy. As of note, he believes Zofran and Phenergan make his symptoms worse. Referral to [facility] due to chronic abdominal pain. Keep appt with Dr. Arnoldo Morale upcoming.

## 2015-02-09 NOTE — Progress Notes (Signed)
cc'd to pcp 

## 2015-02-09 NOTE — Assessment & Plan Note (Signed)
44 year old male with chronic intermittent epigastric pain, with history of H.pylori gastritis s/p complete treatment. Unable to test for eradication, as he has not been able to hold PPI for 14 days. US abdomen with stable sludge, HIDA normal. EGD up-to-date, CT abdomen benign. Persistent symptoms during hospitalization felt to be secondary to repetitive vomiting, but he now presents with symptoms in the absence of vomiting. Epigastric pain would be uncommon with gastroparesis, and as this continues to be an issue for him, I would like to refer him to [facility] for further evaluation of chronic abdominal pain. As of note, he is also scheduled to see Dr. Arnoldo Morale June 30th to discuss any possible biliary etiology; although this is less likely, I feel it should be addressed for completeness' sake. Refer to [facility], continue PPI and Carafate. AS OF NOTE: he has actually gained weight despite reports of pain, N/V.

## 2015-02-16 ENCOUNTER — Emergency Department (HOSPITAL_COMMUNITY)
Admission: EM | Admit: 2015-02-16 | Discharge: 2015-02-16 | Disposition: A | Discharge status: Home / Self Care | Payer: Medicaid Other | Attending: Emergency Medicine | Admitting: Emergency Medicine

## 2015-02-16 ENCOUNTER — Encounter (HOSPITAL_COMMUNITY): Payer: Self-pay | Admitting: Emergency Medicine

## 2015-02-16 DIAGNOSIS — E0843 Diabetes mellitus due to underlying condition with diabetic autonomic (poly)neuropathy: Secondary | ICD-10-CM | POA: Diagnosis not present

## 2015-02-16 DIAGNOSIS — Z8619 Personal history of other infectious and parasitic diseases: Secondary | ICD-10-CM | POA: Diagnosis not present

## 2015-02-16 DIAGNOSIS — Z794 Long term (current) use of insulin: Secondary | ICD-10-CM | POA: Insufficient documentation

## 2015-02-16 DIAGNOSIS — H409 Unspecified glaucoma: Secondary | ICD-10-CM | POA: Diagnosis not present

## 2015-02-16 DIAGNOSIS — Z880 Allergy status to penicillin: Secondary | ICD-10-CM | POA: Insufficient documentation

## 2015-02-16 DIAGNOSIS — I10 Essential (primary) hypertension: Secondary | ICD-10-CM | POA: Insufficient documentation

## 2015-02-16 DIAGNOSIS — Z79899 Other long term (current) drug therapy: Secondary | ICD-10-CM | POA: Diagnosis not present

## 2015-02-16 DIAGNOSIS — R1013 Epigastric pain: Secondary | ICD-10-CM | POA: Diagnosis not present

## 2015-02-16 DIAGNOSIS — K3184 Gastroparesis: Secondary | ICD-10-CM

## 2015-02-16 DIAGNOSIS — K219 Gastro-esophageal reflux disease without esophagitis: Secondary | ICD-10-CM | POA: Insufficient documentation

## 2015-02-16 DIAGNOSIS — E1143 Type 2 diabetes mellitus with diabetic autonomic (poly)neuropathy: Secondary | ICD-10-CM

## 2015-02-16 MED ORDER — TRAMADOL HCL 50 MG PO TABS
50.0000 mg | ORAL_TABLET | Freq: Once | ORAL | Status: AC
  Administered 2015-02-16: 50 mg via ORAL
  Filled 2015-02-16: qty 1

## 2015-02-16 MED ORDER — TRAMADOL HCL 50 MG PO TABS: 50.0000 mg | ORAL_TABLET | Freq: Four times a day (QID) | ORAL | Status: DC | PRN

## 2015-02-16 MED ORDER — GI COCKTAIL ~~LOC~~
30.0000 mL | Freq: Once | ORAL | Status: AC
  Administered 2015-02-16: 30 mL via ORAL
  Filled 2015-02-16: qty 30

## 2015-02-16 NOTE — ED Notes (Signed)
Pt reports abdominal pain that started this am. Pt recently dx with H. Pylori.

## 2015-02-16 NOTE — Discharge Instructions (Signed)
Prescription for tramadol. Follow-up your primary care doctor.

## 2015-02-16 NOTE — ED Provider Notes (Signed)
CSN: [id number]     Arrival date & time 02/16/15  1357 History   First MD Initiated Contact with Patient 02/16/15 1424     Chief Complaint  Patient presents with  . Abdominal Pain     (Consider location/radiation/quality/duration/timing/severity/associated sxs/prior Treatment) HPI.... Patient has known gastroparesis from long-standing diabetes. He is now complaining of epigastric pain. No vomiting or diarrhea. This is not a new problem. Usually GI cocktail has helped in the past. No fever, sweats, chills.  Past Medical History  Diagnosis Date  . Type 2 diabetes mellitus with complications     diagnosed around age 59  . Glaucoma   . Eye hemorrhage   . Gastritis   . GERD (gastroesophageal reflux disease)   . Esophagitis   . Hiccups   . Helicobacter pylori (H. pylori) infection   . Hypertension    Past Surgical History  Procedure Laterality Date  . Eye surgery    . Esophagogastroduodenoscopy  2015    Dr. Britta Mccreedy  . Esophagogastroduodenoscopy N/A 10/26/2014    RMR: Distal esophagititis-likely reflux related although an element of pill induced injuruy no exclueded  status post biopsy. Diffusely abnormal gastric mucosa of uncertain signigicane -status post gastric biopsy. Focal area of excoriation in the cardia most consistant with a trauma of heaving.    Family History  Problem Relation Age of Onset  . Ovarian cancer Mother   . Cervical cancer Sister   . Diabetes Sister   . Colon cancer Neg Hx    History  Substance Use Topics  . Smoking status: Never Smoker   . Smokeless tobacco: Never Used  . Alcohol Use: No    Review of Systems  All other systems reviewed and are negative.     Allergies  Fish allergy; Aspirin; Bactrim; Doxycycline; Penicillins; Phenergan; and Zofran  Home Medications   Prior to Admission medications   Medication Sig Start Date End Date Taking? Authorizing Provider  brimonidine-timolol (COMBIGAN) 0.2-0.5 % ophthalmic solution Place 1 drop into  both eyes at bedtime.   Yes Historical Provider, MD  Dapagliflozin Propanediol (FARXIGA) 10 MG TABS Take 10 mg by mouth daily.   Yes Historical Provider, MD  gabapentin (NEURONTIN) 600 MG tablet Take 600 mg by mouth daily as needed (for neuropathy).    Yes Historical Provider, MD  hydroxypropyl methylcellulose / hypromellose (ISOPTO TEARS / GONIOVISC) 2.5 % ophthalmic solution Place 1 drop into both eyes as needed for dry eyes.   Yes Historical Provider, MD  insulin aspart protamine- aspart (NOVOLOG MIX 70/30) (70-30) 100 UNIT/ML injection Inject 35 Units into the skin 3 (three) times daily.   Yes Historical Provider, MD  insulin detemir (LEVEMIR) 100 UNIT/ML injection Inject 30 Units into the skin at bedtime.   Yes Historical Provider, MD  lisinopril (PRINIVIL,ZESTRIL) 10 MG tablet Take 10 mg by mouth daily.   Yes Historical Provider, MD  metoCLOPramide (REGLAN) 10 MG tablet Take 1 tablet (10 mg total) by mouth 4 (four) times daily -  before meals and at bedtime. 02/06/15  Yes Orvil Feil, NP  pantoprazole (PROTONIX) 40 MG tablet Take 1 tablet (40 mg total) by mouth 2 (two) times daily before a meal. 01/31/15  Yes Carlis Stable, NP  potassium chloride SA (K-DUR,KLOR-CON) 20 MEQ tablet Take 20 mEq by mouth 2 (two) times daily.   Yes Historical Provider, MD  sucralfate (CARAFATE) 1 G tablet Take 1 tablet (1 g total) by mouth 4 (four) times daily. 01/10/15  Yes Orvil Feil, NP  alum & mag hydroxide-simeth (MAALOX/MYLANTA) 200-200-20 MG/5ML suspension Take 30 mLs by mouth every 6 (six) hours as needed for indigestion or heartburn.    Historical Provider, MD  HYDROcodone-acetaminophen (NORCO/VICODIN) 5-325 MG per tablet Take 1 tablet by mouth every 6 (six) hours as needed for moderate pain. Patient not taking: Reported on 02/06/2015 01/11/15   Milton Ferguson, MD  ranitidine (ZANTAC) 150 MG tablet Take 1 tablet (150 mg total) by mouth 2 (two) times daily. Patient not taking: Reported on 02/16/2015 01/14/15   Orpah Greek, MD  traMADol (ULTRAM) 50 MG tablet Take 1 tablet (50 mg total) by mouth every 6 (six) hours as needed. 02/16/15   [name], MD   BP 126/83 mmHg  Pulse 99  Temp(Src) 98.5 F (36.9 C) (Oral)  Resp 16  Ht 6' (1.829 m)  Wt 258 lb (117.028 kg)  BMI 34.98 kg/m2  SpO2 98% Physical Exam  Constitutional: He is oriented to person, place, and time. He appears well-developed and well-nourished.  HENT:  Head: Normocephalic and atraumatic.  Eyes: Conjunctivae and EOM are normal. Pupils are equal, round, and reactive to light.  Neck: Normal range of motion. Neck supple.  Cardiovascular: Normal rate and regular rhythm.   Pulmonary/Chest: Effort normal and breath sounds normal.  Abdominal: Soft. Bowel sounds are normal.  Slight epigastric tenderness  Musculoskeletal: Normal range of motion.  Neurological: He is alert and oriented to person, place, and time.  Skin: Skin is warm and dry.  Psychiatric: He has a normal mood and affect. His behavior is normal.  Nursing note and vitals reviewed.   ED Course  Procedures (including critical care time) Labs Review Labs Reviewed - No data to display  Imaging Review No results found.   EKG Interpretation None      MDM   Final diagnoses:  Gastroparesis due to DM    Patient is in no acute distress. No acute abdomen. He is requesting only a GI cocktail and some tramadol for his discomfort.    [name], MD 02/16/15 1524

## 2015-02-17 ENCOUNTER — Inpatient Hospital Stay (HOSPITAL_COMMUNITY)
Admission: EM | Admit: 2015-02-17 | Discharge: 2015-02-20 | DRG: 074 | Disposition: A | Discharge status: Home / Self Care | Payer: Medicaid Other | Attending: Internal Medicine | Admitting: Internal Medicine

## 2015-02-17 ENCOUNTER — Encounter (HOSPITAL_COMMUNITY): Payer: Self-pay | Admitting: Emergency Medicine

## 2015-02-17 ENCOUNTER — Observation Stay (HOSPITAL_COMMUNITY): Payer: Medicaid Other

## 2015-02-17 DIAGNOSIS — K3184 Gastroparesis: Secondary | ICD-10-CM | POA: Diagnosis present

## 2015-02-17 DIAGNOSIS — Z833 Family history of diabetes mellitus: Secondary | ICD-10-CM

## 2015-02-17 DIAGNOSIS — E1143 Type 2 diabetes mellitus with diabetic autonomic (poly)neuropathy: Secondary | ICD-10-CM

## 2015-02-17 DIAGNOSIS — Z8041 Family history of malignant neoplasm of ovary: Secondary | ICD-10-CM

## 2015-02-17 DIAGNOSIS — K219 Gastro-esophageal reflux disease without esophagitis: Secondary | ICD-10-CM | POA: Diagnosis present

## 2015-02-17 DIAGNOSIS — E860 Dehydration: Secondary | ICD-10-CM | POA: Diagnosis not present

## 2015-02-17 DIAGNOSIS — I10 Essential (primary) hypertension: Secondary | ICD-10-CM | POA: Diagnosis present

## 2015-02-17 DIAGNOSIS — R1013 Epigastric pain: Secondary | ICD-10-CM | POA: Diagnosis present

## 2015-02-17 DIAGNOSIS — R112 Nausea with vomiting, unspecified: Secondary | ICD-10-CM

## 2015-02-17 DIAGNOSIS — R1110 Vomiting, unspecified: Secondary | ICD-10-CM | POA: Diagnosis not present

## 2015-02-17 DIAGNOSIS — Z794 Long term (current) use of insulin: Secondary | ICD-10-CM

## 2015-02-17 DIAGNOSIS — Z8049 Family history of malignant neoplasm of other genital organs: Secondary | ICD-10-CM

## 2015-02-17 DIAGNOSIS — Z79899 Other long term (current) drug therapy: Secondary | ICD-10-CM

## 2015-02-17 DIAGNOSIS — E11649 Type 2 diabetes mellitus with hypoglycemia without coma: Secondary | ICD-10-CM | POA: Diagnosis present

## 2015-02-17 LAB — CBC WITH DIFFERENTIAL/PLATELET
Basophils Absolute: 0 10*3/uL (ref 0.0–0.1)
Basophils Relative: 0 % (ref 0–1)
Eosinophils Absolute: 0 10*3/uL (ref 0.0–0.7)
Eosinophils Relative: 1 % (ref 0–5)
HCT: 39.7 % (ref 39.0–52.0)
Hemoglobin: 14.1 g/dL (ref 13.0–17.0)
Lymphocytes Relative: 21 % (ref 12–46)
Lymphs Abs: 1.7 10*3/uL (ref 0.7–4.0)
MCH: 31.5 pg (ref 26.0–34.0)
MCHC: 35.5 g/dL (ref 30.0–36.0)
MCV: 88.8 fL (ref 78.0–100.0)
Monocytes Absolute: 0.7 10*3/uL (ref 0.1–1.0)
Monocytes Relative: 9 % (ref 3–12)
Neutro Abs: 5.6 10*3/uL (ref 1.7–7.7)
Neutrophils Relative %: 69 % (ref 43–77)
Platelets: 166 10*3/uL (ref 150–400)
RBC: 4.47 MIL/uL (ref 4.22–5.81)
RDW: 12.3 % (ref 11.5–15.5)
WBC: 8 10*3/uL (ref 4.0–10.5)

## 2015-02-17 LAB — BASIC METABOLIC PANEL
Anion gap: 11 (ref 5–15)
BUN: 21 mg/dL — ABNORMAL HIGH (ref 6–20)
CO2: 26 mmol/L (ref 22–32)
Calcium: 9.3 mg/dL (ref 8.9–10.3)
Chloride: 98 mmol/L — ABNORMAL LOW (ref 101–111)
Creatinine, Ser: 1.32 mg/dL — ABNORMAL HIGH (ref 0.61–1.24)
GFR calc Af Amer: >60 mL/min (ref >60)
GFR calc non Af Amer: >60 mL/min (ref >60)
Glucose, Bld: 194 mg/dL — ABNORMAL HIGH (ref 65–99)
Potassium: 4 mmol/L (ref 3.5–5.1)
Sodium: 135 mmol/L (ref 135–145)

## 2015-02-17 LAB — HEPATIC FUNCTION PANEL
ALT: 16 U/L — ABNORMAL LOW (ref 17–63)
AST: 20 U/L (ref 15–41)
Albumin: 4.2 g/dL (ref 3.5–5.0)
Alkaline Phosphatase: 50 U/L (ref 38–126)
Bilirubin, Direct: 0.1 mg/dL (ref 0.1–0.5)
Indirect Bilirubin: 0.6 mg/dL (ref 0.3–0.9)
Total Bilirubin: 0.7 mg/dL (ref 0.3–1.2)
Total Protein: 7.8 g/dL (ref 6.5–8.1)

## 2015-02-17 LAB — TROPONIN I: Troponin I: <0.03 ng/mL (ref <0.031)

## 2015-02-17 LAB — LIPASE, BLOOD: Lipase: 26 U/L (ref 22–51)

## 2015-02-17 MED ORDER — BRIMONIDINE TARTRATE-TIMOLOL 0.2-0.5 % OP SOLN
1.0000 [drp] | Freq: Every day | OPHTHALMIC | Status: DC
  Filled 2015-02-17: qty 5

## 2015-02-17 MED ORDER — TRAMADOL HCL 50 MG PO TABS: 50.0000 mg | ORAL_TABLET | Freq: Four times a day (QID) | ORAL | Status: DC | PRN

## 2015-02-17 MED ORDER — PANTOPRAZOLE SODIUM 40 MG IV SOLR
40.0000 mg | Freq: Two times a day (BID) | INTRAVENOUS | Status: DC
  Filled 2015-02-17: qty 40

## 2015-02-17 MED ORDER — INSULIN ASPART 100 UNIT/ML ~~LOC~~ SOLN
0.0000 [IU] | Freq: Three times a day (TID) | SUBCUTANEOUS | Status: DC
  Administered 2015-02-18 – 2015-02-19 (×2): 2 [IU] via SUBCUTANEOUS
  Administered 2015-02-20: 1 [IU] via SUBCUTANEOUS

## 2015-02-17 MED ORDER — METOCLOPRAMIDE HCL 5 MG/ML IJ SOLN
10.0000 mg | Freq: Once | INTRAMUSCULAR | Status: AC
  Administered 2015-02-17: 10 mg via INTRAVENOUS
  Filled 2015-02-17: qty 2

## 2015-02-17 MED ORDER — METOCLOPRAMIDE HCL 5 MG/ML IJ SOLN
10.0000 mg | Freq: Four times a day (QID) | INTRAMUSCULAR | Status: DC | PRN
  Administered 2015-02-18: 10 mg via INTRAVENOUS
  Filled 2015-02-17: qty 2

## 2015-02-17 MED ORDER — PANTOPRAZOLE SODIUM 40 MG IV SOLR
40.0000 mg | Freq: Once | INTRAVENOUS | Status: AC
  Administered 2015-02-17: 40 mg via INTRAVENOUS

## 2015-02-17 MED ORDER — DAPAGLIFLOZIN PROPANEDIOL 10 MG PO TABS: 10.0000 mg | ORAL_TABLET | Freq: Every day | ORAL | Status: DC

## 2015-02-17 MED ORDER — SODIUM CHLORIDE 0.9 % IV BOLUS (SEPSIS)
1000.0000 mL | Freq: Once | INTRAVENOUS | Status: AC
  Administered 2015-02-17: 1000 mL via INTRAVENOUS

## 2015-02-17 MED ORDER — GI COCKTAIL ~~LOC~~
30.0000 mL | Freq: Three times a day (TID) | ORAL | Status: DC | PRN
  Administered 2015-02-17 – 2015-02-18 (×2): 30 mL via ORAL
  Filled 2015-02-17 (×2): qty 30

## 2015-02-17 MED ORDER — ACETAMINOPHEN 325 MG PO TABS: 650.0000 mg | ORAL_TABLET | Freq: Four times a day (QID) | ORAL | Status: DC | PRN

## 2015-02-17 MED ORDER — SUCRALFATE 1 G PO TABS
1.0000 g | ORAL_TABLET | Freq: Four times a day (QID) | ORAL | Status: DC
  Administered 2015-02-18 – 2015-02-20 (×10): 1 g via ORAL
  Filled 2015-02-17 (×10): qty 1

## 2015-02-17 MED ORDER — SODIUM CHLORIDE 0.9 % IV SOLN
INTRAVENOUS | Status: AC
  Administered 2015-02-17: 1 mL via INTRAVENOUS

## 2015-02-17 MED ORDER — GABAPENTIN 600 MG PO TABS
600.0000 mg | ORAL_TABLET | Freq: Every day | ORAL | Status: DC | PRN
  Filled 2015-02-17: qty 1

## 2015-02-17 MED ORDER — SODIUM CHLORIDE 0.9 % IV SOLN
1000.0000 mL | Freq: Once | INTRAVENOUS | Status: AC
  Administered 2015-02-17: 1000 mL via INTRAVENOUS

## 2015-02-17 MED ORDER — PANTOPRAZOLE SODIUM 40 MG PO TBEC
40.0000 mg | DELAYED_RELEASE_TABLET | Freq: Two times a day (BID) | ORAL | Status: DC
  Administered 2015-02-18 – 2015-02-20 (×5): 40 mg via ORAL
  Filled 2015-02-17 (×5): qty 1

## 2015-02-17 MED ORDER — SODIUM CHLORIDE 0.9 % IV SOLN
INTRAVENOUS | Status: AC
  Administered 2015-02-18: 1 mL via INTRAVENOUS

## 2015-02-17 MED ORDER — INSULIN ASPART PROT & ASPART (70-30 MIX) 100 UNIT/ML ~~LOC~~ SUSP
35.0000 [IU] | Freq: Three times a day (TID) | SUBCUTANEOUS | Status: DC
  Administered 2015-02-18 – 2015-02-19 (×4): 35 [IU] via SUBCUTANEOUS
  Filled 2015-02-17: qty 10

## 2015-02-17 MED ORDER — ENOXAPARIN SODIUM 40 MG/0.4ML ~~LOC~~ SOLN
40.0000 mg | SUBCUTANEOUS | Status: DC
  Administered 2015-02-18 – 2015-02-19 (×3): 40 mg via SUBCUTANEOUS
  Filled 2015-02-17 (×3): qty 0.4

## 2015-02-17 MED ORDER — INSULIN DETEMIR 100 UNIT/ML ~~LOC~~ SOLN
30.0000 [IU] | Freq: Every day | SUBCUTANEOUS | Status: DC
  Administered 2015-02-18 (×2): 30 [IU] via SUBCUTANEOUS
  Filled 2015-02-17 (×4): qty 0.3

## 2015-02-17 MED ORDER — ACETAMINOPHEN 650 MG RE SUPP: 650.0000 mg | Freq: Four times a day (QID) | RECTAL | Status: DC | PRN

## 2015-02-17 MED ORDER — HYPROMELLOSE (GONIOSCOPIC) 2.5 % OP SOLN
1.0000 [drp] | OPHTHALMIC | Status: DC | PRN
  Filled 2015-02-17: qty 15

## 2015-02-17 MED ORDER — LISINOPRIL 10 MG PO TABS
10.0000 mg | ORAL_TABLET | Freq: Every day | ORAL | Status: DC
  Administered 2015-02-18 – 2015-02-20 (×3): 10 mg via ORAL
  Filled 2015-02-17 (×3): qty 1

## 2015-02-17 MED ORDER — SODIUM CHLORIDE 0.9 % IJ SOLN
3.0000 mL | Freq: Two times a day (BID) | INTRAMUSCULAR | Status: DC
  Administered 2015-02-18 – 2015-02-19 (×2): 3 mL via INTRAVENOUS

## 2015-02-17 MED ORDER — GI COCKTAIL ~~LOC~~
30.0000 mL | Freq: Once | ORAL | Status: AC
  Administered 2015-02-17: 30 mL via ORAL
  Filled 2015-02-17: qty 30

## 2015-02-17 NOTE — Progress Notes (Signed)
Arrived to floor from ED, moved to bed, eyes closed, no distress, XRay called for Chest X-ray ordered in ED but not done prior to arrival to floor.  Will be coming to get patient for chest X-ray prior to admission data collected.

## 2015-02-17 NOTE — ED Notes (Signed)
\  [id number]

## 2015-02-17 NOTE — ED Notes (Signed)
PT c/o upper abdominal pain worsening x2 days. PT stated ED visit with dx of gastroparesis yesterday and states no relief at home with medications. PT states hx of H. Polyri. PT denies any diarrhea with normal BM today and states n/v.

## 2015-02-17 NOTE — ED Provider Notes (Signed)
CSN: [id number]     Arrival date & time 02/17/15  1727 History   First MD Initiated Contact with Patient 02/17/15 1750     Chief Complaint  Patient presents with  . Abdominal Pain     (Consider location/radiation/quality/duration/timing/severity/associated sxs/prior Treatment) HPI.... Patient is a long-standing diabetic with gastroparesis. Second visit to the emergency department in 24 hours for vomiting and inability to keep fluids down. He is allergic to Phenergan and Zofran so he often uses Reglan and a GI cocktail. No fever, sweats, chills, dysuria, diarrhea.  Severity is moderate.  Nothing makes sxs better or worse.  Past Medical History  Diagnosis Date  . Type 2 diabetes mellitus with complications     diagnosed around age 55  . Glaucoma   . Eye hemorrhage   . Gastritis   . GERD (gastroesophageal reflux disease)   . Esophagitis   . Hiccups   . Helicobacter pylori (H. pylori) infection   . Hypertension    Past Surgical History  Procedure Laterality Date  . Eye surgery    . Esophagogastroduodenoscopy  2015    Dr. Britta Mccreedy  . Esophagogastroduodenoscopy N/A 10/26/2014    RMR: Distal esophagititis-likely reflux related although an element of pill induced injuruy no exclueded  status post biopsy. Diffusely abnormal gastric mucosa of uncertain signigicane -status post gastric biopsy. Focal area of excoriation in the cardia most consistant with a trauma of heaving.    Family History  Problem Relation Age of Onset  . Ovarian cancer Mother   . Cervical cancer Sister   . Diabetes Sister   . Colon cancer Neg Hx    History  Substance Use Topics  . Smoking status: Never Smoker   . Smokeless tobacco: Never Used  . Alcohol Use: No    Review of Systems  All other systems reviewed and are negative.     Allergies  Fish allergy; Aspirin; Bactrim; Doxycycline; Penicillins; Phenergan; and Zofran  Home Medications   Prior to Admission medications   Medication Sig Start Date End  Date Taking? Authorizing Provider  brimonidine-timolol (COMBIGAN) 0.2-0.5 % ophthalmic solution Place 1 drop into both eyes at bedtime.   Yes Historical Provider, MD  Dapagliflozin Propanediol (FARXIGA) 10 MG TABS Take 10 mg by mouth daily.   Yes Historical Provider, MD  gabapentin (NEURONTIN) 600 MG tablet Take 600 mg by mouth daily as needed (for neuropathy).    Yes Historical Provider, MD  hydroxypropyl methylcellulose / hypromellose (ISOPTO TEARS / GONIOVISC) 2.5 % ophthalmic solution Place 1 drop into both eyes as needed for dry eyes.   Yes Historical Provider, MD  insulin aspart protamine- aspart (NOVOLOG MIX 70/30) (70-30) 100 UNIT/ML injection Inject 35 Units into the skin 3 (three) times daily.   Yes Historical Provider, MD  insulin detemir (LEVEMIR) 100 UNIT/ML injection Inject 30 Units into the skin at bedtime.   Yes Historical Provider, MD  lisinopril (PRINIVIL,ZESTRIL) 10 MG tablet Take 10 mg by mouth daily.   Yes Historical Provider, MD  metoCLOPramide (REGLAN) 10 MG tablet Take 1 tablet (10 mg total) by mouth 4 (four) times daily -  before meals and at bedtime. 02/06/15  Yes Orvil Feil, NP  pantoprazole (PROTONIX) 40 MG tablet Take 1 tablet (40 mg total) by mouth 2 (two) times daily before a meal. 01/31/15  Yes Carlis Stable, NP  potassium chloride SA (K-DUR,KLOR-CON) 20 MEQ tablet Take 20 mEq by mouth 2 (two) times daily.   Yes Historical Provider, MD  sucralfate (CARAFATE) 1  G tablet Take 1 tablet (1 g total) by mouth 4 (four) times daily. 01/10/15  Yes Orvil Feil, NP  traMADol (ULTRAM) 50 MG tablet Take 1 tablet (50 mg total) by mouth every 6 (six) hours as needed. 02/16/15  Yes [name], MD  alum & mag hydroxide-simeth (MAALOX/MYLANTA) 200-200-20 MG/5ML suspension Take 30 mLs by mouth every 6 (six) hours as needed for indigestion or heartburn.    Historical Provider, MD  HYDROcodone-acetaminophen (NORCO/VICODIN) 5-325 MG per tablet Take 1 tablet by mouth every 6 (six) hours as needed for  moderate pain. Patient not taking: Reported on 02/06/2015 01/11/15   Milton Ferguson, MD  ranitidine (ZANTAC) 150 MG tablet Take 1 tablet (150 mg total) by mouth 2 (two) times daily. Patient not taking: Reported on 02/16/2015 01/14/15   Orpah Greek, MD   BP 109/65 mmHg  Pulse 98  Temp(Src) 98.8 F (37.1 C) (Oral)  Resp 20  Ht 6' (1.829 m)  Wt 258 lb (117.028 kg)  BMI 34.98 kg/m2  SpO2 96% Physical Exam  Constitutional: He is oriented to person, place, and time.  vomiting  HENT:  Head: Normocephalic and atraumatic.  Eyes: Conjunctivae and EOM are normal. Pupils are equal, round, and reactive to light.  Neck: Normal range of motion. Neck supple.  Cardiovascular: Normal rate and regular rhythm.   Pulmonary/Chest: Effort normal and breath sounds normal.  Abdominal: Soft. Bowel sounds are normal.  Musculoskeletal: Normal range of motion.  Neurological: He is alert and oriented to person, place, and time.  Skin: Skin is warm and dry.  Psychiatric: He has a normal mood and affect. His behavior is normal.  Nursing note and vitals reviewed.   ED Course  Procedures (including critical care time) Labs Review Labs Reviewed  BASIC METABOLIC PANEL - Abnormal; Notable for the following:    Chloride 98 (*)    Glucose, Bld 194 (*)    BUN 21 (*)    Creatinine, Ser 1.32 (*)    All other components within normal limits  HEPATIC FUNCTION PANEL - Abnormal; Notable for the following:    ALT 16 (*)    All other components within normal limits  CBC WITH DIFFERENTIAL/PLATELET  LIPASE, BLOOD    Imaging Review No results found.   EKG Interpretation None      MDM   Final diagnoses:  Gastroparesis due to DM    Patient still feels nauseated after 2 L of IV fluid and 2 doses of IV Reglan. Admit to observation.    [name], MD 02/17/15 2151

## 2015-02-17 NOTE — H&P (Signed)
VICK FILTER is an 44 y.o. male.     Chief Complaint: n/v HPI: 44 yo male with dm2, htn, gerd, gastritis, apparently had n/v, the other day and was seen in the ED.  Today apparently had further n/v, along with epigastric pain.  "sharp".  Pt denies fever, chills, diarrhea, brbpr, black stool.  Pt attributes this to gastroparesis and also possibly working outdoors and being dehydrated.  Pt presented to ED and was found to have dehydration along with n/v.    Past Medical History  Diagnosis Date  . Type 2 diabetes mellitus with complications     diagnosed around age 50  . Glaucoma   . Eye hemorrhage   . Gastritis   . GERD (gastroesophageal reflux disease)   . Esophagitis   . Hiccups   . Helicobacter pylori (H. pylori) infection   . Hypertension     Past Surgical History  Procedure Laterality Date  . Eye surgery    . Esophagogastroduodenoscopy  2015    Dr. Britta Mccreedy  . Esophagogastroduodenoscopy N/A 10/26/2014    RMR: Distal esophagititis-likely reflux related although an element of pill induced injuruy no exclueded  status post biopsy. Diffusely abnormal gastric mucosa of uncertain signigicane -status post gastric biopsy. Focal area of excoriation in the cardia most consistant with a trauma of heaving.     Family History  Problem Relation Age of Onset  . Ovarian cancer Mother   . Cervical cancer Sister   . Diabetes Sister   . Colon cancer Neg Hx    Social History:  reports that he has never smoked. He has never used smokeless tobacco. He reports that he does not drink alcohol or use illicit drugs.  Allergies:  Allergies  Allergen Reactions  . Fish Allergy Anaphylaxis, Hives and Rash  . Aspirin Hives and Itching  . Bactrim [Sulfamethoxazole-Trimethoprim] Itching  . Doxycycline Hives and Itching  . Penicillins Hives and Itching  . Phenergan [Promethazine Hcl] Nausea And Vomiting  . Zofran [Ondansetron Hcl] Other (See Comments)    Hiccups    Medications  reviewed   Results for orders placed or performed during the hospital encounter of 02/17/15 (from the past 48 hour(s))  Basic metabolic panel     Status: Abnormal   Collection Time: 02/17/15  5:50 PM  Result Value Ref Range   Sodium 135 135 - 145 mmol/L   Potassium 4.0 3.5 - 5.1 mmol/L   Chloride 98 (L) 101 - 111 mmol/L   CO2 26 22 - 32 mmol/L   Glucose, Bld 194 (H) 65 - 99 mg/dL   BUN 21 (H) 6 - 20 mg/dL   Creatinine, Ser 1.32 (H) 0.61 - 1.24 mg/dL   Calcium 9.3 8.9 - 10.3 mg/dL   GFR calc non Af Amer >60 >60 mL/min   GFR calc Af Amer >60 >60 mL/min    Comment: (NOTE) The eGFR has been calculated using the CKD EPI equation. This calculation has not been validated in all clinical situations. eGFR's persistently <60 mL/min signify possible Chronic Kidney Disease.    Anion gap 11 5 - 15  CBC with Differential     Status: None   Collection Time: 02/17/15  5:50 PM  Result Value Ref Range   WBC 8.0 4.0 - 10.5 K/uL   RBC 4.47 4.22 - 5.81 MIL/uL   Hemoglobin 14.1 13.0 - 17.0 g/dL   HCT 39.7 39.0 - 52.0 %   MCV 88.8 78.0 - 100.0 fL   MCH 31.5 26.0 - 34.0  pg   MCHC 35.5 30.0 - 36.0 g/dL   RDW 12.3 11.5 - 15.5 %   Platelets 166 150 - 400 K/uL   Neutrophils Relative % 69 43 - 77 %   Neutro Abs 5.6 1.7 - 7.7 K/uL   Lymphocytes Relative 21 12 - 46 %   Lymphs Abs 1.7 0.7 - 4.0 K/uL   Monocytes Relative 9 3 - 12 %   Monocytes Absolute 0.7 0.1 - 1.0 K/uL   Eosinophils Relative 1 0 - 5 %   Eosinophils Absolute 0.0 0.0 - 0.7 K/uL   Basophils Relative 0 0 - 1 %   Basophils Absolute 0.0 0.0 - 0.1 K/uL  Hepatic function panel     Status: Abnormal   Collection Time: 02/17/15  5:50 PM  Result Value Ref Range   Total Protein 7.8 6.5 - 8.1 g/dL   Albumin 4.2 3.5 - 5.0 g/dL   AST 20 15 - 41 U/L   ALT 16 (L) 17 - 63 U/L   Alkaline Phosphatase 50 38 - 126 U/L   Total Bilirubin 0.7 0.3 - 1.2 mg/dL   Bilirubin, Direct 0.1 0.1 - 0.5 mg/dL   Indirect Bilirubin 0.6 0.3 - 0.9 mg/dL  Lipase,  blood     Status: None   Collection Time: 02/17/15  5:50 PM  Result Value Ref Range   Lipase 26 22 - 51 U/L   No results found.  Review of Systems  Constitutional: Negative.   HENT: Negative.   Eyes: Negative.   Respiratory: Negative.   Cardiovascular: Negative.   Gastrointestinal: Positive for nausea, vomiting and abdominal pain. Negative for heartburn, diarrhea, constipation, blood in stool and melena.  Genitourinary: Negative.   Musculoskeletal: Negative.   Skin: Negative.   Neurological: Negative.   Endo/Heme/Allergies: Negative.   Psychiatric/Behavioral: Negative.     Blood pressure 109/65, pulse 98, temperature 98.8 F (37.1 C), temperature source Oral, resp. rate 20, height 6' (1.829 m), weight 117.028 kg (258 lb), SpO2 96 %. Physical Exam  Constitutional: He is oriented to person, place, and time. He appears well-developed and well-nourished.  HENT:  Head: Normocephalic and atraumatic.  Mouth/Throat: No oropharyngeal exudate.  Eyes: Conjunctivae and EOM are normal. Pupils are equal, round, and reactive to light. No scleral icterus.  Neck: Normal range of motion. Neck supple. No JVD present. No tracheal deviation present. No thyromegaly present.  Cardiovascular: Normal rate and regular rhythm.  Exam reveals no gallop and no friction rub.   No murmur heard. Respiratory: Effort normal and breath sounds normal. No respiratory distress. He has no wheezes. He has no rales.  GI: Soft. Bowel sounds are normal. He exhibits no distension. There is no tenderness. There is no rebound and no guarding.  Musculoskeletal: Normal range of motion. He exhibits no edema or tenderness.  Lymphadenopathy:    He has no cervical adenopathy.  Neurological: He is alert and oriented to person, place, and time. He has normal reflexes. He displays normal reflexes. No cranial nerve deficit. He exhibits normal muscle tone. Coordination normal.  Skin: Skin is warm and dry. No rash noted. No erythema.  No pallor.  Psychiatric: He has a normal mood and affect. His behavior is normal. Judgment and thought content normal.     Assessment/Plan N/v:  reglan iv,  Cont protonix, cont sucralfate  Gastroparesis Cont reglan Please f/u with [facility] and Dr. Sydell Axon as outpatient  Dehydration Ns iv  Dm2 HOld novolog 70/30, and levemir NPO for now except for medications  Hypertension Cont current medications  DVT prophylaxis:  Scd, lovenox  Code Status:  FULL CODE   Jani Gravel 02/17/2015, 10:11 PM

## 2015-02-18 DIAGNOSIS — I10 Essential (primary) hypertension: Secondary | ICD-10-CM | POA: Diagnosis not present

## 2015-02-18 DIAGNOSIS — E1143 Type 2 diabetes mellitus with diabetic autonomic (poly)neuropathy: Secondary | ICD-10-CM | POA: Diagnosis not present

## 2015-02-18 DIAGNOSIS — K3184 Gastroparesis: Secondary | ICD-10-CM | POA: Diagnosis not present

## 2015-02-18 DIAGNOSIS — R1013 Epigastric pain: Secondary | ICD-10-CM | POA: Diagnosis not present

## 2015-02-18 DIAGNOSIS — E860 Dehydration: Secondary | ICD-10-CM | POA: Diagnosis not present

## 2015-02-18 LAB — COMPREHENSIVE METABOLIC PANEL
ALT: 15 U/L — ABNORMAL LOW (ref 17–63)
AST: 19 U/L (ref 15–41)
Albumin: 3.7 g/dL (ref 3.5–5.0)
Alkaline Phosphatase: 41 U/L (ref 38–126)
Anion gap: 8 (ref 5–15)
BUN: 18 mg/dL (ref 6–20)
CO2: 23 mmol/L (ref 22–32)
Calcium: 8.5 mg/dL — ABNORMAL LOW (ref 8.9–10.3)
Chloride: 106 mmol/L (ref 101–111)
Creatinine, Ser: 1.07 mg/dL (ref 0.61–1.24)
GFR calc Af Amer: >60 mL/min (ref >60)
GFR calc non Af Amer: >60 mL/min (ref >60)
Glucose, Bld: 174 mg/dL — ABNORMAL HIGH (ref 65–99)
Potassium: 3.7 mmol/L (ref 3.5–5.1)
Sodium: 137 mmol/L (ref 135–145)
Total Bilirubin: 1 mg/dL (ref 0.3–1.2)
Total Protein: 6.8 g/dL (ref 6.5–8.1)

## 2015-02-18 LAB — GLUCOSE, CAPILLARY
Glucose-Capillary: 100 mg/dL — ABNORMAL HIGH (ref 65–99)
Glucose-Capillary: 110 mg/dL — ABNORMAL HIGH (ref 65–99)
Glucose-Capillary: 117 mg/dL — ABNORMAL HIGH (ref 65–99)
Glucose-Capillary: 167 mg/dL — ABNORMAL HIGH (ref 65–99)
Glucose-Capillary: 174 mg/dL — ABNORMAL HIGH (ref 65–99)
Glucose-Capillary: 222 mg/dL — ABNORMAL HIGH (ref 65–99)

## 2015-02-18 LAB — CBC
HCT: 37.8 % — ABNORMAL LOW (ref 39.0–52.0)
Hemoglobin: 13.3 g/dL (ref 13.0–17.0)
MCH: 31.2 pg (ref 26.0–34.0)
MCHC: 35.2 g/dL (ref 30.0–36.0)
MCV: 88.7 fL (ref 78.0–100.0)
Platelets: 147 10*3/uL — ABNORMAL LOW (ref 150–400)
RBC: 4.26 MIL/uL (ref 4.22–5.81)
RDW: 12.3 % (ref 11.5–15.5)
WBC: 8.2 10*3/uL (ref 4.0–10.5)

## 2015-02-18 MED ORDER — METOCLOPRAMIDE HCL 5 MG/ML IJ SOLN
10.0000 mg | Freq: Four times a day (QID) | INTRAMUSCULAR | Status: DC
  Administered 2015-02-18 – 2015-02-20 (×8): 10 mg via INTRAVENOUS
  Filled 2015-02-18 (×8): qty 2

## 2015-02-18 MED ORDER — BRIMONIDINE TARTRATE 0.2 % OP SOLN
1.0000 [drp] | Freq: Every day | OPHTHALMIC | Status: DC
  Administered 2015-02-18 – 2015-02-19 (×2): 1 [drp] via OPHTHALMIC
  Filled 2015-02-18: qty 5

## 2015-02-18 MED ORDER — GABAPENTIN 300 MG PO CAPS: 600.0000 mg | ORAL_CAPSULE | Freq: Every day | ORAL | Status: DC | PRN

## 2015-02-18 MED ORDER — POLYVINYL ALCOHOL 1.4 % OP SOLN: 1.0000 [drp] | OPHTHALMIC | Status: DC | PRN

## 2015-02-18 MED ORDER — LORAZEPAM 2 MG/ML IJ SOLN
1.0000 mg | Freq: Four times a day (QID) | INTRAMUSCULAR | Status: DC | PRN
Start: 2015-02-18 — End: 2015-02-20
  Administered 2015-02-18: 1 mg via INTRAVENOUS
  Filled 2015-02-18: qty 1

## 2015-02-18 MED ORDER — SODIUM CHLORIDE 0.9 % IV SOLN
INTRAVENOUS | Status: DC
  Administered 2015-02-18: 18:00:00 via INTRAVENOUS
  Administered 2015-02-19: 1 mL via INTRAVENOUS

## 2015-02-18 MED ORDER — HYDROMORPHONE HCL 1 MG/ML IJ SOLN
1.0000 mg | INTRAMUSCULAR | Status: DC | PRN
  Administered 2015-02-18 (×2): 1 mg via INTRAVENOUS
  Filled 2015-02-18 (×2): qty 1

## 2015-02-18 MED ORDER — INSULIN DETEMIR 100 UNIT/ML ~~LOC~~ SOLN
SUBCUTANEOUS | Status: AC
  Filled 2015-02-18: qty 1

## 2015-02-18 MED ORDER — TIMOLOL MALEATE 0.5 % OP SOLN
1.0000 [drp] | Freq: Every day | OPHTHALMIC | Status: DC
  Administered 2015-02-18 – 2015-02-19 (×2): 1 [drp] via OPHTHALMIC
  Filled 2015-02-18: qty 5

## 2015-02-18 NOTE — Progress Notes (Signed)
Order entered for Dilaudid 1 mg.  Given.

## 2015-02-18 NOTE — Progress Notes (Signed)
Assisted to bathroom to pass stool.  Having hiccups.

## 2015-02-18 NOTE — Progress Notes (Signed)
Continues to spit up yellow thick liquid, no projectile vomiting.  GI Cocktail given.  Will continue to monitor.

## 2015-02-18 NOTE — Progress Notes (Addendum)
Patient holding throat after GI Cocktail given, informed patient that he received 2 GI Cocktails while in ED and he reported they 'make me feel like my throat is closing up".  Ice water given, patient drank without any difficulty, stood up with assistance and voided in urinal, returned to bed and currently resting without any distress.  Hospitalist E-paged about patient status and request for pain medication.  Waiting return call.

## 2015-02-18 NOTE — Progress Notes (Signed)
Returned from chest x-ray agitated and stating "i am going to vomit."  Yelling, spitting in emesis basin.  Encouraged patient to lay down and rest.  Resting with eyes closed, no distress.  Unable to obtain midnight blood sugar until 0130 due to patient not being still.  Ordered meds given.

## 2015-02-18 NOTE — Progress Notes (Signed)
[facility] PROGRESS NOTE  DOMINICK GUELI [id number] DOB: 07/30/1971    PCP:   Jeri Modena   HPI: Timothy Clark is an 44 y.o. male with hx Dm2 with chronic abdominal pain and gastroparesis, last admitted for same a month ago, followed by Dr Felipe Drone and GI at [facility], Glaucoma, HTN, GERD, admitted for intractable, nausea and vomiting.  He is NOT on chronic narcotics, and is allergic to Zofran and Phenergan.  Currently on Reglan.   Rewiew of Systems:  Constitutional: Negative for malaise, fever and chills. No significant weight loss or weight gain Eyes: Negative for eye pain, redness and discharge, diplopia, visual changes, or flashes of light. ENMT: Negative for ear pain, hoarseness, nasal congestion, sinus pressure and sore throat. No headaches; tinnitus, drooling, or problem swallowing. Cardiovascular: Negative for chest pain, palpitations, diaphoresis, dyspnea and peripheral edema. ; No orthopnea, PND Respiratory: Negative for cough, hemoptysis, wheezing and stridor. No pleuritic chestpain. Gastrointestinal: Negative for diarrhea, constipation,melena, blood in stool, hematemesis, jaundice and rectal bleeding.    Genitourinary: Negative for frequency, dysuria, incontinence,flank pain and hematuria; Musculoskeletal: Negative for back pain and neck pain. Negative for swelling and trauma.;  Skin: . Negative for pruritus, rash, abrasions, bruising and skin lesion.; ulcerations Neuro: Negative for headache, lightheadedness and neck stiffness. Negative for weakness, altered level of consciousness , altered mental status, extremity weakness, burning feet, involuntary movement, seizure and syncope.  Psych: negative for anxiety, depression, insomnia, tearfulness, panic attacks, hallucinations, paranoia, suicidal or homicidal ideation    Past Medical History  Diagnosis Date  . Type 2 diabetes mellitus with complications     diagnosed around age 39  . Glaucoma   . Eye hemorrhage    . Gastritis   . GERD (gastroesophageal reflux disease)   . Esophagitis   . Hiccups   . Helicobacter pylori (H. pylori) infection   . Hypertension     Past Surgical History  Procedure Laterality Date  . Eye surgery    . Esophagogastroduodenoscopy  2015    Dr. Britta Mccreedy  . Esophagogastroduodenoscopy N/A 10/26/2014    RMR: Distal esophagititis-likely reflux related although an element of pill induced injuruy no exclueded  status post biopsy. Diffusely abnormal gastric mucosa of uncertain signigicane -status post gastric biopsy. Focal area of excoriation in the cardia most consistant with a trauma of heaving.     Medications:  HOME MEDS: Prior to Admission medications   Medication Sig Start Date End Date Taking? Authorizing Provider  brimonidine-timolol (COMBIGAN) 0.2-0.5 % ophthalmic solution Place 1 drop into both eyes at bedtime.   Yes Historical Provider, MD  Dapagliflozin Propanediol (FARXIGA) 10 MG TABS Take 10 mg by mouth daily.   Yes Historical Provider, MD  gabapentin (NEURONTIN) 600 MG tablet Take 600 mg by mouth daily as needed (for neuropathy).    Yes Historical Provider, MD  hydroxypropyl methylcellulose / hypromellose (ISOPTO TEARS / GONIOVISC) 2.5 % ophthalmic solution Place 1 drop into both eyes as needed for dry eyes.   Yes Historical Provider, MD  insulin aspart protamine- aspart (NOVOLOG MIX 70/30) (70-30) 100 UNIT/ML injection Inject 35 Units into the skin 3 (three) times daily.   Yes Historical Provider, MD  insulin detemir (LEVEMIR) 100 UNIT/ML injection Inject 30 Units into the skin at bedtime.   Yes Historical Provider, MD  lisinopril (PRINIVIL,ZESTRIL) 10 MG tablet Take 10 mg by mouth daily.   Yes Historical Provider, MD  metoCLOPramide (REGLAN) 10 MG tablet Take 1 tablet (10 mg total) by mouth 4 (four) times  daily -  before meals and at bedtime. 02/06/15  Yes Orvil Feil, NP  pantoprazole (PROTONIX) 40 MG tablet Take 1 tablet (40 mg total) by mouth 2 (two) times daily  before a meal. 01/31/15  Yes Carlis Stable, NP  potassium chloride SA (K-DUR,KLOR-CON) 20 MEQ tablet Take 20 mEq by mouth 2 (two) times daily.   Yes Historical Provider, MD  sucralfate (CARAFATE) 1 G tablet Take 1 tablet (1 g total) by mouth 4 (four) times daily. 01/10/15  Yes Orvil Feil, NP  traMADol (ULTRAM) 50 MG tablet Take 1 tablet (50 mg total) by mouth every 6 (six) hours as needed. 02/16/15  Yes [name], MD  alum & mag hydroxide-simeth (MAALOX/MYLANTA) 200-200-20 MG/5ML suspension Take 30 mLs by mouth every 6 (six) hours as needed for indigestion or heartburn.    Historical Provider, MD  HYDROcodone-acetaminophen (NORCO/VICODIN) 5-325 MG per tablet Take 1 tablet by mouth every 6 (six) hours as needed for moderate pain. Patient not taking: Reported on 02/06/2015 01/11/15   Milton Ferguson, MD  ranitidine (ZANTAC) 150 MG tablet Take 1 tablet (150 mg total) by mouth 2 (two) times daily. Patient not taking: Reported on 02/16/2015 01/14/15   Orpah Greek, MD     Allergies:  Allergies  Allergen Reactions  . Fish Allergy Anaphylaxis, Hives and Rash  . Aspirin Hives and Itching  . Bactrim [Sulfamethoxazole-Trimethoprim] Itching  . Doxycycline Hives and Itching  . Penicillins Hives and Itching  . Phenergan [Promethazine Hcl] Nausea And Vomiting  . Zofran [Ondansetron Hcl] Other (See Comments)    Hiccups     Social History:   reports that he has never smoked. He has never used smokeless tobacco. He reports that he does not drink alcohol or use illicit drugs.  Family History: Family History  Problem Relation Age of Onset  . Ovarian cancer Mother   . Cervical cancer Sister   . Diabetes Sister   . Colon cancer Neg Hx      Physical Exam: Filed Vitals:   02/17/15 2130 02/18/15 0001 02/18/15 0715 02/18/15 1200  BP: 119/75 121/68 157/64 171/100  Pulse: 112 100 102   Temp:  98 F (36.7 C) 97.5 F (36.4 C)   TempSrc:  Oral Oral   Resp:  22 17 18   Height:      Weight:      SpO2:  99% 100% 97% 99%   Blood pressure 171/100, pulse 102, temperature 97.5 F (36.4 C), temperature source Oral, resp. rate 18, height 6' (1.829 m), weight 117.028 kg (258 lb), SpO2 99 %.  GEN:  Pleasant  patient lying in the stretcher in no acute distress; cooperative with exam. PSYCH:  alert and oriented x4; does not appear anxious or depressed; affect is appropriate. HEENT: Mucous membranes pink and anicteric; PERRLA; EOM intact; no cervical lymphadenopathy nor thyromegaly or carotid bruit; no JVD; There were no stridor. Neck is very supple. Breasts:: Not examined CHEST WALL: No tenderness CHEST: Normal respiration, clear to auscultation bilaterally.  HEART: Regular rate and rhythm.  There are no murmur, rub, or gallops.   BACK: No kyphosis or scoliosis; no CVA tenderness ABDOMEN: soft and non-tender; no masses, no organomegaly, normal abdominal bowel sounds; no pannus; no intertriginous candida. There is no rebound and no distention. Rectal Exam: Not done EXTREMITIES: No bone or joint deformity; age-appropriate arthropathy of the hands and knees; no edema; no ulcerations.  There is no calf tenderness. Genitalia: not examined PULSES: 2+ and symmetric SKIN: Normal  hydration no rash or ulceration CNS: Cranial nerves 2-12 grossly intact no focal lateralizing neurologic deficit.  Speech is fluent; uvula elevated with phonation, facial symmetry and tongue midline. DTR are normal bilaterally, cerebella exam is intact, barbinski is negative and strengths are equaled bilaterally.  No sensory loss.   Labs on Admission:  Basic Metabolic Panel:  Recent Labs Lab 02/17/15 1750 02/18/15 0631  NA 135 137  K 4.0 3.7  CL 98* 106  CO2 26 23  GLUCOSE 194* 174*  BUN 21* 18  CREATININE 1.32* 1.07  CALCIUM 9.3 8.5*   Liver Function Tests:  Recent Labs Lab 02/17/15 1750 02/18/15 0631  AST 20 19  ALT 16* 15*  ALKPHOS 50 41  BILITOT 0.7 1.0  PROT 7.8 6.8  ALBUMIN 4.2 3.7    Recent Labs Lab  02/17/15 1750  LIPASE 26   No results for input(s): AMMONIA in the last 168 hours. CBC:  Recent Labs Lab 02/17/15 1750 02/18/15 0631  WBC 8.0 8.2  NEUTROABS 5.6  --   HGB 14.1 13.3  HCT 39.7 37.8*  MCV 88.8 88.7  PLT 166 147*   Cardiac Enzymes:  Recent Labs Lab 02/17/15 1750  TROPONINI <0.03    CBG:  Recent Labs Lab 02/18/15 0132 02/18/15 0741 02/18/15 1122  GLUCAP 222* 174* 167*     Radiological Exams on Admission: Dg Chest 2 View  02/18/2015   CLINICAL DATA:  Acute onset of nausea, vomiting and epigastric abdominal pain. Initial encounter.  EXAM: CHEST  2 VIEW  COMPARISON:  Chest radiograph performed 01/14/2015  FINDINGS: The lungs are well-aerated. Mild left basilar atelectasis is noted. Mild peribronchial thickening seen. There is no evidence of pleural effusion or pneumothorax.  The heart is normal in size; the mediastinal contour is within normal limits. No acute osseous abnormalities are seen.  IMPRESSION: Mild left basilar atelectasis noted. Mild peribronchial thickening seen.   Electronically Signed   By: Garald Balding M.D.   On: 02/18/2015 02:15   Assessment/Plan Present on Admission:  . Gastroparesis DM2 Nausea and Vomiting. HTN.   PLAN: Will continue with clear liquid. Continue with IV Reglan and add Ativan.  Increase Dilaudid to 1mg  q3 hours OK. Continue other meds.   Other plans as per orders.  Code Status: FULL Haskel Khan, MD. [facility] Pager [phone] 7pm to 7am.  02/18/2015, 1:16 PM

## 2015-02-19 DIAGNOSIS — E860 Dehydration: Secondary | ICD-10-CM

## 2015-02-19 DIAGNOSIS — E1143 Type 2 diabetes mellitus with diabetic autonomic (poly)neuropathy: Secondary | ICD-10-CM | POA: Diagnosis present

## 2015-02-19 DIAGNOSIS — R112 Nausea with vomiting, unspecified: Secondary | ICD-10-CM | POA: Diagnosis present

## 2015-02-19 DIAGNOSIS — K219 Gastro-esophageal reflux disease without esophagitis: Secondary | ICD-10-CM | POA: Diagnosis present

## 2015-02-19 DIAGNOSIS — Z8041 Family history of malignant neoplasm of ovary: Secondary | ICD-10-CM | POA: Diagnosis not present

## 2015-02-19 DIAGNOSIS — R1013 Epigastric pain: Secondary | ICD-10-CM | POA: Diagnosis not present

## 2015-02-19 DIAGNOSIS — E11649 Type 2 diabetes mellitus with hypoglycemia without coma: Secondary | ICD-10-CM | POA: Diagnosis present

## 2015-02-19 DIAGNOSIS — Z79899 Other long term (current) drug therapy: Secondary | ICD-10-CM | POA: Diagnosis not present

## 2015-02-19 DIAGNOSIS — Z833 Family history of diabetes mellitus: Secondary | ICD-10-CM | POA: Diagnosis not present

## 2015-02-19 DIAGNOSIS — I10 Essential (primary) hypertension: Secondary | ICD-10-CM | POA: Diagnosis present

## 2015-02-19 DIAGNOSIS — Z794 Long term (current) use of insulin: Secondary | ICD-10-CM | POA: Diagnosis not present

## 2015-02-19 DIAGNOSIS — Z8049 Family history of malignant neoplasm of other genital organs: Secondary | ICD-10-CM | POA: Diagnosis not present

## 2015-02-19 DIAGNOSIS — K3184 Gastroparesis: Secondary | ICD-10-CM | POA: Diagnosis present

## 2015-02-19 LAB — GLUCOSE, CAPILLARY
Glucose-Capillary: 95 mg/dL (ref 65–99)
Glucose-Capillary: 70 mg/dL (ref 65–99)
Glucose-Capillary: 105 mg/dL — ABNORMAL HIGH (ref 65–99)
Glucose-Capillary: 167 mg/dL — ABNORMAL HIGH (ref 65–99)
Glucose-Capillary: 47 mg/dL — ABNORMAL LOW (ref 65–99)
Glucose-Capillary: 53 mg/dL — ABNORMAL LOW (ref 65–99)
Glucose-Capillary: 185 mg/dL — ABNORMAL HIGH (ref 65–99)
Glucose-Capillary: 157 mg/dL — ABNORMAL HIGH (ref 65–99)
Glucose-Capillary: 45 mg/dL — ABNORMAL LOW (ref 65–99)

## 2015-02-19 LAB — HEMOGLOBIN A1C
Hgb A1c MFr Bld: 7.3 % — ABNORMAL HIGH (ref 4.8–5.6)
Mean Plasma Glucose: 163 mg/dL

## 2015-02-19 MED ORDER — DEXTROSE 50 % IV SOLN
INTRAVENOUS | Status: AC
  Administered 2015-02-19: 50 mL
  Filled 2015-02-19: qty 50

## 2015-02-19 MED ORDER — DEXTROSE 5 % IV SOLN
INTRAVENOUS | Status: DC
  Administered 2015-02-19: 16:00:00 via INTRAVENOUS

## 2015-02-19 MED ORDER — GLUCOSE 40 % PO GEL
ORAL | Status: AC
  Administered 2015-02-19: 15:00:00
  Filled 2015-02-19: qty 1

## 2015-02-19 NOTE — Care Management Note (Signed)
Case Management Note  Patient Details  Name: Timothy Clark MRN: [id number] Date of Birth: 12/30/1970  Subjective/Objective:                  Pt admitted from home with vomiting due to gastroparesis. Pt lives with his mother and will return home at discharge. Pt is independent with ADl's.  Action/Plan: Anticipate discharge once pt able to tolerate regular food.  Expected Discharge Date:  02/19/15               Expected Discharge Plan:  Home/Self Care  In-House Referral:  NA  Discharge planning Services  CM Consult  Post Acute Care Choice:  NA Choice offered to:  NA  DME Arranged:    DME Agency:     HH Arranged:    HH Agency:     Status of Service:  Completed, signed off  Medicare Important Message Given:    Date Medicare IM Given:    Medicare IM give by:    Date Additional Medicare IM Given:    Additional Medicare Important Message give by:     If discussed at [place] of Stay Meetings, dates discussed:    Additional Comments:  Joylene Draft, RN 02/19/2015, 1:58 PM

## 2015-02-19 NOTE — Progress Notes (Signed)
CRITICAL VALUE ALERT  Critical value received:  CBG 47  Date of notification:  02/19/2015  Time of notification:  [id number]  Critical value read back:Yes.    Nurse who received alert:  Radene Gunning  MD notified (1st page) : Dr Mamie Nick. Le  Time of first page:  [phone]  MD notified (2nd page):  Time of second page:  Responding MD:  Dr Mamie Nick. Le  Time MD responded:  [phone]

## 2015-02-19 NOTE — Progress Notes (Addendum)
CRITICAL VALUE ALERT  Critical value received:  CBG 53  Date of notification:  02/19/2015  Time of notification:  [id number]  Critical value read back:Yes.    Nurse who received alert:  Radene Gunning  MD notified (1st page):   Dr Mamie Nick.Le Time of first page:  [phone]  MD notified (2nd page):  Time of second page:  Responding MD:  Dr Mamie Nick. Le  Time MD responded: [id number]

## 2015-02-19 NOTE — Progress Notes (Signed)
Patient says" I feel like my blood sugar is dropping" CBG was checked,and it was 53, Dr Susanne Borders notified. Will continue to monitor patient.

## 2015-02-19 NOTE — Progress Notes (Signed)
Patient's recheck CBG was 167. Will continue to monitor patient. Family at bedside.

## 2015-02-19 NOTE — Progress Notes (Signed)
[facility] PROGRESS NOTE  [name] [id number] DOB: 07-Apr-1971    PCP:   Jeri Modena   HPI:  Timothy Clark is an 44 y.o. male with hx Dm2 with chronic abdominal pain and gastroparesis, last admitted for same a month ago, followed by Dr Felipe Drone and GI at [facility], Glaucoma, HTN, GERD, admitted for intractable, nausea and vomiting. He is NOT on chronic narcotics, and is allergic to Zofran and Phenergan. Currently on Reglan. He has no further nausea or vomiting.  He is hypoglycemic with BS of 53 and 44.   Rewiew of Systems:  Constitutional: Negative for malaise, fever and chills. No significant weight loss or weight gain Eyes: Negative for eye pain, redness and discharge, diplopia, visual changes, or flashes of light. ENMT: Negative for ear pain, hoarseness, nasal congestion, sinus pressure and sore throat. No headaches; tinnitus, drooling, or problem swallowing. Cardiovascular: Negative for chest pain, palpitations, diaphoresis, dyspnea and peripheral edema. ; No orthopnea, PND Respiratory: Negative for cough, hemoptysis, wheezing and stridor. No pleuritic chestpain. Gastrointestinal: Negative for nausea, vomiting, diarrhea, constipation, abdominal pain, melena, blood in stool, hematemesis, jaundice and rectal bleeding.    Genitourinary: Negative for frequency, dysuria, incontinence,flank pain and hematuria; Musculoskeletal: Negative for back pain and neck pain. Negative for swelling and trauma.;  Skin: . Negative for pruritus, rash, abrasions, bruising and skin lesion.; ulcerations Neuro: Negative for headache, lightheadedness and neck stiffness. Negative for weakness, altered level of consciousness , altered mental status, extremity weakness, burning feet, involuntary movement, seizure and syncope.  Psych: negative for anxiety, depression, insomnia, tearfulness, panic attacks, hallucinations, paranoia, suicidal or homicidal ideation    Past Medical History   Diagnosis Date  . Type 2 diabetes mellitus with complications     diagnosed around age 77  . Glaucoma   . Eye hemorrhage   . Gastritis   . GERD (gastroesophageal reflux disease)   . Esophagitis   . Hiccups   . Helicobacter pylori (H. pylori) infection   . Hypertension     Past Surgical History  Procedure Laterality Date  . Eye surgery    . Esophagogastroduodenoscopy  2015    Dr. Britta Mccreedy  . Esophagogastroduodenoscopy N/A 10/26/2014    RMR: Distal esophagititis-likely reflux related although an element of pill induced injuruy no exclueded  status post biopsy. Diffusely abnormal gastric mucosa of uncertain signigicane -status post gastric biopsy. Focal area of excoriation in the cardia most consistant with a trauma of heaving.     Medications:  HOME MEDS: Prior to Admission medications   Medication Sig Start Date End Date Taking? Authorizing Provider  brimonidine-timolol (COMBIGAN) 0.2-0.5 % ophthalmic solution Place 1 drop into both eyes at bedtime.   Yes Historical Provider, MD  Dapagliflozin Propanediol (FARXIGA) 10 MG TABS Take 10 mg by mouth daily.   Yes Historical Provider, MD  gabapentin (NEURONTIN) 600 MG tablet Take 600 mg by mouth daily as needed (for neuropathy).    Yes Historical Provider, MD  hydroxypropyl methylcellulose / hypromellose (ISOPTO TEARS / GONIOVISC) 2.5 % ophthalmic solution Place 1 drop into both eyes as needed for dry eyes.   Yes Historical Provider, MD  insulin aspart protamine- aspart (NOVOLOG MIX 70/30) (70-30) 100 UNIT/ML injection Inject 35 Units into the skin 3 (three) times daily.   Yes Historical Provider, MD  insulin detemir (LEVEMIR) 100 UNIT/ML injection Inject 30 Units into the skin at bedtime.   Yes Historical Provider, MD  lisinopril (PRINIVIL,ZESTRIL) 10 MG tablet Take 10 mg by mouth daily.  Yes Historical Provider, MD  metoCLOPramide (REGLAN) 10 MG tablet Take 1 tablet (10 mg total) by mouth 4 (four) times daily -  before meals and at  bedtime. 02/06/15  Yes Orvil Feil, NP  pantoprazole (PROTONIX) 40 MG tablet Take 1 tablet (40 mg total) by mouth 2 (two) times daily before a meal. 01/31/15  Yes Carlis Stable, NP  potassium chloride SA (K-DUR,KLOR-CON) 20 MEQ tablet Take 20 mEq by mouth 2 (two) times daily.   Yes Historical Provider, MD  sucralfate (CARAFATE) 1 G tablet Take 1 tablet (1 g total) by mouth 4 (four) times daily. 01/10/15  Yes Orvil Feil, NP  traMADol (ULTRAM) 50 MG tablet Take 1 tablet (50 mg total) by mouth every 6 (six) hours as needed. 02/16/15  Yes [name], MD  alum & mag hydroxide-simeth (MAALOX/MYLANTA) 200-200-20 MG/5ML suspension Take 30 mLs by mouth every 6 (six) hours as needed for indigestion or heartburn.    Historical Provider, MD  HYDROcodone-acetaminophen (NORCO/VICODIN) 5-325 MG per tablet Take 1 tablet by mouth every 6 (six) hours as needed for moderate pain. Patient not taking: Reported on 02/06/2015 01/11/15   Milton Ferguson, MD  ranitidine (ZANTAC) 150 MG tablet Take 1 tablet (150 mg total) by mouth 2 (two) times daily. Patient not taking: Reported on 02/16/2015 01/14/15   Orpah Greek, MD     Allergies:  Allergies  Allergen Reactions  . Fish Allergy Anaphylaxis, Hives and Rash  . Aspirin Hives and Itching  . Bactrim [Sulfamethoxazole-Trimethoprim] Itching  . Doxycycline Hives and Itching  . Penicillins Hives and Itching  . Phenergan [Promethazine Hcl] Nausea And Vomiting  . Zofran [Ondansetron Hcl] Other (See Comments)    Hiccups     Social History:   reports that he has never smoked. He has never used smokeless tobacco. He reports that he does not drink alcohol or use illicit drugs.  Family History: Family History  Problem Relation Age of Onset  . Ovarian cancer Mother   . Cervical cancer Sister   . Diabetes Sister   . Colon cancer Neg Hx      Physical Exam: Filed Vitals:   02/18/15 1353 02/18/15 2300 02/19/15 0521 02/19/15 1320  BP: 158/83 136/71 138/89 160/89  Pulse: 97  83 72 85  Temp: 98.2 F (36.8 C) 98.7 F (37.1 C) 98.7 F (37.1 C) 98.7 F (37.1 C)  TempSrc: Oral Oral Oral Oral  Resp: 18 20 17 18   Height:      Weight:   116.847 kg (257 lb 9.6 oz)   SpO2: 98% 97% 98% 100%   Blood pressure 160/89, pulse 85, temperature 98.7 F (37.1 C), temperature source Oral, resp. rate 18, height 6' (1.829 m), weight 116.847 kg (257 lb 9.6 oz), SpO2 100 %.  GEN:  Pleasant  patient lying in the stretcher in no acute distress; cooperative with exam. PSYCH:  alert and oriented x4; does not appear anxious or depressed; affect is appropriate. HEENT: Mucous membranes pink and anicteric; PERRLA; EOM intact; no cervical lymphadenopathy nor thyromegaly or carotid bruit; no JVD; There were no stridor. Neck is very supple. Breasts:: Not examined CHEST WALL: No tenderness CHEST: Normal respiration, clear to auscultation bilaterally.  HEART: Regular rate and rhythm.  There are no murmur, rub, or gallops.   BACK: No kyphosis or scoliosis; no CVA tenderness ABDOMEN: soft and non-tender; no masses, no organomegaly, normal abdominal bowel sounds; no pannus; no intertriginous candida. There is no rebound and no distention. Rectal Exam:  Not done EXTREMITIES: No bone or joint deformity; age-appropriate arthropathy of the hands and knees; no edema; no ulcerations.  There is no calf tenderness. Genitalia: not examined PULSES: 2+ and symmetric SKIN: Normal hydration no rash or ulceration CNS: Cranial nerves 2-12 grossly intact no focal lateralizing neurologic deficit.  Speech is fluent; uvula elevated with phonation, facial symmetry and tongue midline. DTR are normal bilaterally, cerebella exam is intact, barbinski is negative and strengths are equaled bilaterally.  No sensory loss.   Labs on Admission:  Basic Metabolic Panel:  Recent Labs Lab 02/17/15 1750 02/18/15 0631  NA 135 137  K 4.0 3.7  CL 98* 106  CO2 26 23  GLUCOSE 194* 174*  BUN 21* 18  CREATININE 1.32* 1.07   CALCIUM 9.3 8.5*   Liver Function Tests:  Recent Labs Lab 02/17/15 1750 02/18/15 0631  AST 20 19  ALT 16* 15*  ALKPHOS 50 41  BILITOT 0.7 1.0  PROT 7.8 6.8  ALBUMIN 4.2 3.7    Recent Labs Lab 02/17/15 1750  LIPASE 26   No results for input(s): AMMONIA in the last 168 hours. CBC:  Recent Labs Lab 02/17/15 1750 02/18/15 0631  WBC 8.0 8.2  NEUTROABS 5.6  --   HGB 14.1 13.3  HCT 39.7 37.8*  MCV 88.8 88.7  PLT 166 147*   Cardiac Enzymes:  Recent Labs Lab 02/17/15 1750  TROPONINI <0.03    CBG:  Recent Labs Lab 02/18/15 2010 02/18/15 2357 02/19/15 0418 02/19/15 0732 02/19/15 1143  GLUCAP 110* 100* 95 70 105*     Radiological Exams on Admission: Dg Chest 2 View  02/18/2015   CLINICAL DATA:  Acute onset of nausea, vomiting and epigastric abdominal pain. Initial encounter.  EXAM: CHEST  2 VIEW  COMPARISON:  Chest radiograph performed 01/14/2015  FINDINGS: The lungs are well-aerated. Mild left basilar atelectasis is noted. Mild peribronchial thickening seen. There is no evidence of pleural effusion or pneumothorax.  The heart is normal in size; the mediastinal contour is within normal limits. No acute osseous abnormalities are seen.  IMPRESSION: Mild left basilar atelectasis noted. Mild peribronchial thickening seen.   Electronically Signed   By: Garald Balding M.D.   On: 02/18/2015 02:15   Assessment/Plan Present on Admission:  . Gastroparesis . Abdominal pain, epigastric . Essential hypertension . Gastroparesis due to DM  PLAN: Diabetic gastroparesis.  Admitted for symptomatic Tx.  Doing better.  Will advance diet to carb modified.  Continue with D5 NS at 100cc per hour.  If he can eat, will d/c him home tomorrow.  Other plans as per orders.  Code Status: FULL Haskel Khan, MD. [facility] Pager [phone] 7pm to 7am.  02/19/2015, 3:10 PM

## 2015-02-20 LAB — GLUCOSE, CAPILLARY
Glucose-Capillary: 135 mg/dL — ABNORMAL HIGH (ref 65–99)
Glucose-Capillary: 150 mg/dL — ABNORMAL HIGH (ref 65–99)
Glucose-Capillary: 204 mg/dL — ABNORMAL HIGH (ref 65–99)
Glucose-Capillary: 63 mg/dL — ABNORMAL LOW (ref 65–99)
Glucose-Capillary: 88 mg/dL (ref 65–99)

## 2015-02-20 MED ORDER — INSULIN ASPART PROT & ASPART (70-30 MIX) 100 UNIT/ML ~~LOC~~ SUSP: 17.5000 [IU] | Freq: Three times a day (TID) | SUBCUTANEOUS | Status: DC

## 2015-02-20 MED ORDER — INSULIN ASPART PROT & ASPART (70-30 MIX) 100 UNIT/ML ~~LOC~~ SUSP
15.0000 [IU] | Freq: Three times a day (TID) | SUBCUTANEOUS | Status: DC
Start: 2015-02-20 — End: 2016-09-05

## 2015-02-20 NOTE — Care Management Note (Signed)
Case Management Note  Patient Details  Name: Timothy Clark MRN: [id number] Date of Birth: 05-02-1971  Subjective/Objective:                    Action/Plan:   Expected Discharge Date:  02/19/15               Expected Discharge Plan:  Home/Self Care  In-House Referral:  NA  Discharge planning Services  CM Consult  Post Acute Care Choice:  NA Choice offered to:  NA  DME Arranged:    DME Agency:     HH Arranged:    [place] Agency:     Status of Service:  Completed, signed off  Medicare Important Message Given:    Date Medicare IM Given:    Medicare IM give by:    Date Additional Medicare IM Given:    Additional Medicare Important Message give by:     If discussed at [place] of Stay Meetings, dates discussed:    Additional Comments: Pt discharged home today. No CM needs noted. Christinia Gully Forest, RN 02/20/2015, 10:53 AM

## 2015-02-20 NOTE — Progress Notes (Signed)
Patient discharged home with instructions given on medications,and follow up visits,patient verbalized understanding. No c/o pain or discomfort noted. Prescription sent to Pharmacy of choice documented on AVS. Accompanied by staff to an awaiting vehicle.

## 2015-02-20 NOTE — Discharge Summary (Signed)
Physician Discharge Summary  Timothy Clark [id number] DOB: 05/30/1971 DOA: 02/17/2015  PCP: Tawni Carnes, PA-C  Admit date: 02/17/2015 Discharge date: 02/20/2015  Time spent: 35 minutes  Recommendations for Outpatient Follow-up:  1. Follow up with PCP in one week.    Discharge Diagnoses:  Active Problems:   Abdominal pain, epigastric   Essential hypertension   Gastroparesis due to DM   Gastroparesis   Nausea & vomiting   Dehydration   Discharge Condition: Stable.   Diet recommendation: Carb modified diet.   Filed Weights   02/17/15 1734 02/19/15 0521 02/20/15 0526  Weight: 117.028 kg (258 lb) 116.847 kg (257 lb 9.6 oz) 118.117 kg (260 lb 6.4 oz)    History of present illness: patient was admitted into the hospital by Dr Maudie Mercury on February 17, 2015 for intractable nausea and vomitig with hx of diabetic gastroparesis and prior similar admission.  As per his H and P:  " HPI: 44 yo male with dm2, htn, gerd, gastritis, apparently had n/v, the other day and was seen in the ED. Today apparently had further n/v, along with epigastric pain. "sharp". Pt denies fever, chills, diarrhea, brbpr, black stool. Pt attributes this to gastroparesis and also possibly working outdoors and being dehydrated. Pt presented to ED and was found to have dehydration along with n/v.    Hospital Course: Patient was admitted and Reglan was used IV, as he has allergy to Zofran and Phenergan.  He was ordered IV ativan with it, but didn't need it.  His nausea and vomiting abated, and clear liquid was given, then advanced and he was able to eat.  He was given IV dilaudid, up to 1mg  IV q 3 hours, and was doing well with it.  His BS was a little low at times, requiring IVF and D50.  His insulin will be reduced upon discharge, but his [place] will be resumed.  He is anxious to go home, and is stable for discharge today.  WIll have him follow up with his PCP in one week.  Thank you and good day.    Discharge  Exam: Filed Vitals:   02/20/15 0526  BP: 135/80  Pulse: 87  Temp: 98.8 F (37.1 C)  Resp: 18   Discharge Instructions   Discharge Instructions    Diet - low sodium heart healthy    Complete by:  As directed      Discharge instructions    Complete by:  As directed   Follow up with your doctor to regulate your Blood sugar.  Continue with your Reglan 4 x per day for your "diabetic gastroparesis".     Increase activity slowly    Complete by:  As directed           Current Discharge Medication List    CONTINUE these medications which have CHANGED   Details  insulin aspart protamine- aspart (NOVOLOG MIX 70/30) (70-30) 100 UNIT/ML injection Inject 0.15 mLs (15 Units total) into the skin 3 (three) times daily. Qty: 10 mL, Refills: 11      CONTINUE these medications which have NOT CHANGED   Details  brimonidine-timolol (COMBIGAN) 0.2-0.5 % ophthalmic solution Place 1 drop into both eyes at bedtime.    Dapagliflozin Propanediol (FARXIGA) 10 MG TABS Take 10 mg by mouth daily.    gabapentin (NEURONTIN) 600 MG tablet Take 600 mg by mouth daily as needed (for neuropathy).     hydroxypropyl methylcellulose / hypromellose (ISOPTO TEARS / GONIOVISC) 2.5 % ophthalmic solution Place 1 drop  into both eyes as needed for dry eyes.    insulin detemir (LEVEMIR) 100 UNIT/ML injection Inject 30 Units into the skin at bedtime.    lisinopril (PRINIVIL,ZESTRIL) 10 MG tablet Take 10 mg by mouth daily.    metoCLOPramide (REGLAN) 10 MG tablet Take 1 tablet (10 mg total) by mouth 4 (four) times daily -  before meals and at bedtime. Qty: 120 tablet, Refills: 3    pantoprazole (PROTONIX) 40 MG tablet Take 1 tablet (40 mg total) by mouth 2 (two) times daily before a meal. Qty: 60 tablet, Refills: 3    potassium chloride SA (K-DUR,KLOR-CON) 20 MEQ tablet Take 20 mEq by mouth 2 (two) times daily.    sucralfate (CARAFATE) 1 G tablet Take 1 tablet (1 g total) by mouth 4 (four) times daily. Qty: 120  tablet, Refills: 0    HYDROcodone-acetaminophen (NORCO/VICODIN) 5-325 MG per tablet Take 1 tablet by mouth every 6 (six) hours as needed for moderate pain. Qty: 10 tablet, Refills: 0      STOP taking these medications     traMADol (ULTRAM) 50 MG tablet      alum & mag hydroxide-simeth (MAALOX/MYLANTA) 200-200-20 MG/5ML suspension      ranitidine (ZANTAC) 150 MG tablet        Allergies  Allergen Reactions  . Fish Allergy Anaphylaxis, Hives and Rash  . Aspirin Hives and Itching  . Bactrim [Sulfamethoxazole-Trimethoprim] Itching  . Doxycycline Hives and Itching  . Penicillins Hives and Itching  . Phenergan [Promethazine Hcl] Nausea And Vomiting  . Zofran [Ondansetron Hcl] Other (See Comments)    Hiccups       The results of significant diagnostics from this hospitalization (including imaging, microbiology, ancillary and laboratory) are listed below for reference.    Significant Diagnostic Studies: Dg Chest 2 View  02/18/2015   CLINICAL DATA:  Acute onset of nausea, vomiting and epigastric abdominal pain. Initial encounter.  EXAM: CHEST  2 VIEW  COMPARISON:  Chest radiograph performed 01/14/2015  FINDINGS: The lungs are well-aerated. Mild left basilar atelectasis is noted. Mild peribronchial thickening seen. There is no evidence of pleural effusion or pneumothorax.  The heart is normal in size; the mediastinal contour is within normal limits. No acute osseous abnormalities are seen.  IMPRESSION: Mild left basilar atelectasis noted. Mild peribronchial thickening seen.   Electronically Signed   By: Garald Balding M.D.   On: 02/18/2015 02:15    Microbiology: No results found for this or any previous visit (from the past 240 hour(s)).   Labs: Basic Metabolic Panel:  Recent Labs Lab 02/17/15 1750 02/18/15 0631  NA 135 137  K 4.0 3.7  CL 98* 106  CO2 26 23  GLUCOSE 194* 174*  BUN 21* 18  CREATININE 1.32* 1.07  CALCIUM 9.3 8.5*   Liver Function Tests:  Recent Labs Lab  02/17/15 1750 02/18/15 0631  AST 20 19  ALT 16* 15*  ALKPHOS 50 41  BILITOT 0.7 1.0  PROT 7.8 6.8  ALBUMIN 4.2 3.7    Recent Labs Lab 02/17/15 1750  LIPASE 26   No results for input(s): AMMONIA in the last 168 hours. CBC:  Recent Labs Lab 02/17/15 1750 02/18/15 0631  WBC 8.0 8.2  NEUTROABS 5.6  --   HGB 14.1 13.3  HCT 39.7 37.8*  MCV 88.8 88.7  PLT 166 147*   Cardiac Enzymes:  Recent Labs Lab 02/17/15 1750  TROPONINI <0.03   CBG:  Recent Labs Lab 02/19/15 2128 02/20/15 0003 02/20/15 0217 02/20/15  0403 02/20/15 0745  GLUCAP 157* 88 63* 204* 150*    Signed:  Kelen Laura  [facility] 02/20/2015, 10:41 AM

## 2015-02-26 ENCOUNTER — Ambulatory Visit: Payer: Medicaid Other | Admitting: Nurse Practitioner

## 2015-03-16 ENCOUNTER — Other Ambulatory Visit: Payer: Self-pay

## 2015-03-16 MED ORDER — SUCRALFATE 1 G PO TABS: 1.0000 g | ORAL_TABLET | Freq: Four times a day (QID) | ORAL | Status: DC

## 2015-03-17 ENCOUNTER — Encounter (HOSPITAL_COMMUNITY): Payer: Self-pay | Admitting: Emergency Medicine

## 2015-03-17 ENCOUNTER — Emergency Department (HOSPITAL_COMMUNITY)
Admission: EM | Admit: 2015-03-17 | Discharge: 2015-03-17 | Disposition: A | Discharge status: Home / Self Care | Payer: Medicaid Other | Attending: Emergency Medicine | Admitting: Emergency Medicine

## 2015-03-17 DIAGNOSIS — Z8619 Personal history of other infectious and parasitic diseases: Secondary | ICD-10-CM | POA: Diagnosis not present

## 2015-03-17 DIAGNOSIS — E860 Dehydration: Secondary | ICD-10-CM

## 2015-03-17 DIAGNOSIS — R1010 Upper abdominal pain, unspecified: Secondary | ICD-10-CM | POA: Diagnosis present

## 2015-03-17 DIAGNOSIS — Z794 Long term (current) use of insulin: Secondary | ICD-10-CM | POA: Diagnosis not present

## 2015-03-17 DIAGNOSIS — K3184 Gastroparesis: Secondary | ICD-10-CM

## 2015-03-17 DIAGNOSIS — R000 Tachycardia, unspecified: Secondary | ICD-10-CM | POA: Insufficient documentation

## 2015-03-17 DIAGNOSIS — H5441 Blindness, right eye, normal vision left eye: Secondary | ICD-10-CM | POA: Insufficient documentation

## 2015-03-17 DIAGNOSIS — I10 Essential (primary) hypertension: Secondary | ICD-10-CM | POA: Insufficient documentation

## 2015-03-17 DIAGNOSIS — E1143 Type 2 diabetes mellitus with diabetic autonomic (poly)neuropathy: Secondary | ICD-10-CM | POA: Insufficient documentation

## 2015-03-17 DIAGNOSIS — Z79899 Other long term (current) drug therapy: Secondary | ICD-10-CM | POA: Diagnosis not present

## 2015-03-17 DIAGNOSIS — K219 Gastro-esophageal reflux disease without esophagitis: Secondary | ICD-10-CM | POA: Insufficient documentation

## 2015-03-17 DIAGNOSIS — Z880 Allergy status to penicillin: Secondary | ICD-10-CM | POA: Diagnosis not present

## 2015-03-17 LAB — COMPREHENSIVE METABOLIC PANEL
ALT: 15 U/L — ABNORMAL LOW (ref 17–63)
AST: 17 U/L (ref 15–41)
Albumin: 4.5 g/dL (ref 3.5–5.0)
Alkaline Phosphatase: 50 U/L (ref 38–126)
Anion gap: 14 (ref 5–15)
BUN: 34 mg/dL — ABNORMAL HIGH (ref 6–20)
CO2: 22 mmol/L (ref 22–32)
Calcium: 9.8 mg/dL (ref 8.9–10.3)
Chloride: 97 mmol/L — ABNORMAL LOW (ref 101–111)
Creatinine, Ser: 1.46 mg/dL — ABNORMAL HIGH (ref 0.61–1.24)
GFR calc Af Amer: >60 mL/min (ref >60)
GFR calc non Af Amer: 56 mL/min — ABNORMAL LOW (ref >60)
Glucose, Bld: 235 mg/dL — ABNORMAL HIGH (ref 65–99)
Potassium: 4 mmol/L (ref 3.5–5.1)
Sodium: 133 mmol/L — ABNORMAL LOW (ref 135–145)
Total Bilirubin: 1.1 mg/dL (ref 0.3–1.2)
Total Protein: 8 g/dL (ref 6.5–8.1)

## 2015-03-17 LAB — URINALYSIS, ROUTINE W REFLEX MICROSCOPIC
Bilirubin Urine: NEGATIVE
Glucose, UA: >1000 mg/dL — AB
Leukocytes, UA: NEGATIVE
Nitrite: NEGATIVE
Protein, ur: 30 mg/dL — AB
Specific Gravity, Urine: 1.025 (ref 1.005–1.030)
Urobilinogen, UA: 0.2 mg/dL (ref 0.0–1.0)
pH: 5 (ref 5.0–8.0)

## 2015-03-17 LAB — CBC WITH DIFFERENTIAL/PLATELET
Basophils Absolute: 0 10*3/uL (ref 0.0–0.1)
Basophils Relative: 0 % (ref 0–1)
Eosinophils Absolute: 0 10*3/uL (ref 0.0–0.7)
Eosinophils Relative: 0 % (ref 0–5)
HCT: 39.7 % (ref 39.0–52.0)
Hemoglobin: 14.4 g/dL (ref 13.0–17.0)
Lymphocytes Relative: 22 % (ref 12–46)
Lymphs Abs: 1.9 10*3/uL (ref 0.7–4.0)
MCH: 32 pg (ref 26.0–34.0)
MCHC: 36.3 g/dL — ABNORMAL HIGH (ref 30.0–36.0)
MCV: 88.2 fL (ref 78.0–100.0)
Monocytes Absolute: 0.5 10*3/uL (ref 0.1–1.0)
Monocytes Relative: 6 % (ref 3–12)
Neutro Abs: 6.2 10*3/uL (ref 1.7–7.7)
Neutrophils Relative %: 72 % (ref 43–77)
Platelets: 173 10*3/uL (ref 150–400)
RBC: 4.5 MIL/uL (ref 4.22–5.81)
RDW: 12.1 % (ref 11.5–15.5)
WBC: 8.6 10*3/uL (ref 4.0–10.5)

## 2015-03-17 LAB — URINE MICROSCOPIC-ADD ON

## 2015-03-17 LAB — LIPASE, BLOOD: Lipase: 27 U/L (ref 22–51)

## 2015-03-17 MED ORDER — SODIUM CHLORIDE 0.9 % IV BOLUS (SEPSIS)
1000.0000 mL | Freq: Once | INTRAVENOUS | Status: AC
  Administered 2015-03-17: 1000 mL via INTRAVENOUS

## 2015-03-17 MED ORDER — METOCLOPRAMIDE HCL 5 MG/ML IJ SOLN
10.0000 mg | Freq: Once | INTRAMUSCULAR | Status: AC
  Administered 2015-03-17: 10 mg via INTRAVENOUS
  Filled 2015-03-17: qty 2

## 2015-03-17 MED ORDER — HYDROMORPHONE HCL 1 MG/ML IJ SOLN
1.0000 mg | Freq: Once | INTRAMUSCULAR | Status: AC
  Administered 2015-03-17: 1 mg via INTRAVENOUS
  Filled 2015-03-17: qty 1

## 2015-03-17 NOTE — ED Notes (Signed)
Pt alert & oriented x4, stable gait. Patient given discharge instructions, paperwork & prescription(s). Patient  instructed to stop at the registration desk to finish any additional paperwork. Patient verbalized understanding. Pt left department w/ no further questions. 

## 2015-03-17 NOTE — ED Notes (Signed)
MD at bedside. 

## 2015-03-17 NOTE — Discharge Instructions (Signed)

## 2015-03-17 NOTE — ED Notes (Signed)
Patient c/o upper abd pain with nausea and vomiting that started this morning. Denies any diarrhea or fevers. Per patient hx of gastroparesis and H pylori. Per patient feels similar to his gastroparesis.

## 2015-03-17 NOTE — ED Provider Notes (Signed)
CSN: [id number]     Arrival date & time 03/17/15  1726 History   First MD Initiated Contact with Patient 03/17/15 1800     Chief Complaint  Patient presents with  . Abdominal Pain     (Consider location/radiation/quality/duration/timing/severity/associated sxs/prior Treatment) Patient is a 44 y.o. male presenting with abdominal pain. The history is provided by the patient.  Abdominal Pain Associated symptoms: nausea and vomiting   Associated symptoms: no chest pain, no diarrhea and no shortness of breath    patient with upper abdominal pain for last few days. Has a history of gastroparesis due to diabetes and also has H. pylori infection. No diarrhea. States is similar pain he has had in the past. States he has had no relief with his Reglan and needs pain medicine and antiemetics. No fevers. No chills. No blood in emesis. Has had similar episodes in the past with similar symptoms   Past Medical History  Diagnosis Date  . Type 2 diabetes mellitus with complications     diagnosed around age 87  . Glaucoma   . Eye hemorrhage   . Gastritis   . GERD (gastroesophageal reflux disease)   . Esophagitis   . Hiccups   . Helicobacter pylori (H. pylori) infection   . Hypertension    Past Surgical History  Procedure Laterality Date  . Eye surgery    . Esophagogastroduodenoscopy  2015    Dr. Britta Mccreedy  . Esophagogastroduodenoscopy N/A 10/26/2014    RMR: Distal esophagititis-likely reflux related although an element of pill induced injuruy no exclueded  status post biopsy. Diffusely abnormal gastric mucosa of uncertain signigicane -status post gastric biopsy. Focal area of excoriation in the cardia most consistant with a trauma of heaving.    Family History  Problem Relation Age of Onset  . Ovarian cancer Mother   . Cervical cancer Sister   . Diabetes Sister   . Colon cancer Neg Hx    History  Substance Use Topics  . Smoking status: Never Smoker   . Smokeless tobacco: Never Used  . Alcohol  Use: No    Review of Systems  Constitutional: Negative for activity change and appetite change.  Eyes: Negative for pain.  Respiratory: Negative for chest tightness and shortness of breath.   Cardiovascular: Negative for chest pain and leg swelling.  Gastrointestinal: Positive for nausea, vomiting and abdominal pain. Negative for diarrhea.  Genitourinary: Negative for flank pain.  Musculoskeletal: Negative for back pain and neck stiffness.  Skin: Negative for rash.  Neurological: Negative for weakness, numbness and headaches.  Psychiatric/Behavioral: Negative for behavioral problems.      Allergies  Fish allergy; Aspirin; Bactrim; Doxycycline; Penicillins; Phenergan; and Zofran  Home Medications   Prior to Admission medications   Medication Sig Start Date End Date Taking? Authorizing Provider  brimonidine-timolol (COMBIGAN) 0.2-0.5 % ophthalmic solution Place 1 drop into both eyes at bedtime.   Yes Historical Provider, MD  Dapagliflozin Propanediol (FARXIGA) 10 MG TABS Take 10 mg by mouth daily.   Yes Historical Provider, MD  gabapentin (NEURONTIN) 600 MG tablet Take 600 mg by mouth daily as needed (for neuropathy).    Yes Historical Provider, MD  hydroxypropyl methylcellulose / hypromellose (ISOPTO TEARS / GONIOVISC) 2.5 % ophthalmic solution Place 1 drop into both eyes as needed for dry eyes.   Yes Historical Provider, MD  insulin aspart protamine- aspart (NOVOLOG MIX 70/30) (70-30) 100 UNIT/ML injection Inject 0.15 mLs (15 Units total) into the skin 3 (three) times daily. Patient taking differently:  Inject 35 Units into the skin 3 (three) times daily.  02/20/15  Yes Orvan Falconer, MD  insulin detemir (LEVEMIR) 100 UNIT/ML injection Inject 30 Units into the skin at bedtime.   Yes Historical Provider, MD  lisinopril (PRINIVIL,ZESTRIL) 10 MG tablet Take 10 mg by mouth daily.   Yes Historical Provider, MD  metoCLOPramide (REGLAN) 10 MG tablet Take 1 tablet (10 mg total) by mouth 4 (four)  times daily -  before meals and at bedtime. 02/06/15  Yes Orvil Feil, NP  pantoprazole (PROTONIX) 40 MG tablet Take 1 tablet (40 mg total) by mouth 2 (two) times daily before a meal. 01/31/15  Yes Carlis Stable, NP  potassium chloride SA (K-DUR,KLOR-CON) 20 MEQ tablet Take 20 mEq by mouth 2 (two) times daily.   Yes Historical Provider, MD  sucralfate (CARAFATE) 1 G tablet Take 1 tablet (1 g total) by mouth 4 (four) times daily. 03/16/15  Yes Orvil Feil, NP  HYDROcodone-acetaminophen (NORCO/VICODIN) 5-325 MG per tablet Take 1 tablet by mouth every 6 (six) hours as needed for moderate pain. Patient not taking: Reported on 02/06/2015 01/11/15   Milton Ferguson, MD   BP 107/82 mmHg  Pulse 102  Temp(Src) 97.7 F (36.5 C) (Oral)  Resp 20  Ht 6\' 1"  (1.854 m)  Wt 260 lb (117.935 kg)  BMI 34.31 kg/m2  SpO2 96% Physical Exam  Constitutional: He appears well-developed.  HENT:  Head: Normocephalic.  Eyes:  Patient is blind in right eye  Cardiovascular:  Mild tachycardia  Pulmonary/Chest: Effort normal.  Abdominal: There is tenderness.  Tenderness and upper abdomen without rebound or guarding.  Musculoskeletal: Normal range of motion.  Neurological: He is alert.  Skin: Skin is warm.    ED Course  Procedures (including critical care time) Labs Review Labs Reviewed  CBC WITH DIFFERENTIAL/PLATELET - Abnormal; Notable for the following:    MCHC 36.3 (*)    All other components within normal limits  COMPREHENSIVE METABOLIC PANEL - Abnormal; Notable for the following:    Sodium 133 (*)    Chloride 97 (*)    Glucose, Bld 235 (*)    BUN 34 (*)    Creatinine, Ser 1.46 (*)    ALT 15 (*)    GFR calc non Af Amer 56 (*)    All other components within normal limits  URINALYSIS, ROUTINE W REFLEX MICROSCOPIC (NOT AT [facility]) - Abnormal; Notable for the following:    Glucose, UA >1000 (*)    Hgb urine dipstick TRACE (*)    Ketones, ur TRACE (*)    Protein, ur 30 (*)    All other components within normal  limits  URINE MICROSCOPIC-ADD ON - Abnormal; Notable for the following:    Squamous Epithelial / LPF FEW (*)    Casts GRANULAR CAST (*)    All other components within normal limits  LIPASE, BLOOD    Imaging Review No results found.   EKG Interpretation None      MDM   Final diagnoses:  Gastroparesis  Dehydration    Patient with gastroparesis. Feels better after treatment. Creatinine is mildly elevated and will need to be followed. Will discharge home.   Davonna Belling, MD 03/17/15 [phone]

## 2015-03-18 ENCOUNTER — Emergency Department (HOSPITAL_COMMUNITY): Payer: Medicaid Other

## 2015-03-18 ENCOUNTER — Encounter (HOSPITAL_COMMUNITY): Payer: Self-pay | Admitting: Emergency Medicine

## 2015-03-18 ENCOUNTER — Emergency Department (HOSPITAL_COMMUNITY)
Admission: EM | Admit: 2015-03-18 | Discharge: 2015-03-18 | Disposition: A | Discharge status: Home / Self Care | Payer: Medicaid Other | Attending: Emergency Medicine | Admitting: Emergency Medicine

## 2015-03-18 DIAGNOSIS — Z794 Long term (current) use of insulin: Secondary | ICD-10-CM | POA: Diagnosis not present

## 2015-03-18 DIAGNOSIS — K219 Gastro-esophageal reflux disease without esophagitis: Secondary | ICD-10-CM | POA: Insufficient documentation

## 2015-03-18 DIAGNOSIS — G8929 Other chronic pain: Secondary | ICD-10-CM

## 2015-03-18 DIAGNOSIS — E118 Type 2 diabetes mellitus with unspecified complications: Secondary | ICD-10-CM | POA: Insufficient documentation

## 2015-03-18 DIAGNOSIS — Z79899 Other long term (current) drug therapy: Secondary | ICD-10-CM | POA: Insufficient documentation

## 2015-03-18 DIAGNOSIS — Z8619 Personal history of other infectious and parasitic diseases: Secondary | ICD-10-CM | POA: Diagnosis not present

## 2015-03-18 DIAGNOSIS — R112 Nausea with vomiting, unspecified: Secondary | ICD-10-CM | POA: Insufficient documentation

## 2015-03-18 DIAGNOSIS — I10 Essential (primary) hypertension: Secondary | ICD-10-CM | POA: Insufficient documentation

## 2015-03-18 DIAGNOSIS — R630 Anorexia: Secondary | ICD-10-CM | POA: Insufficient documentation

## 2015-03-18 DIAGNOSIS — H5442 Blindness, left eye, normal vision right eye: Secondary | ICD-10-CM | POA: Insufficient documentation

## 2015-03-18 DIAGNOSIS — R109 Unspecified abdominal pain: Secondary | ICD-10-CM

## 2015-03-18 DIAGNOSIS — Z880 Allergy status to penicillin: Secondary | ICD-10-CM | POA: Insufficient documentation

## 2015-03-18 DIAGNOSIS — H409 Unspecified glaucoma: Secondary | ICD-10-CM | POA: Insufficient documentation

## 2015-03-18 DIAGNOSIS — R1013 Epigastric pain: Secondary | ICD-10-CM | POA: Diagnosis not present

## 2015-03-18 LAB — COMPREHENSIVE METABOLIC PANEL
ALT: 15 U/L — ABNORMAL LOW (ref 17–63)
AST: 19 U/L (ref 15–41)
Albumin: 4.2 g/dL (ref 3.5–5.0)
Alkaline Phosphatase: 44 U/L (ref 38–126)
Anion gap: 12 (ref 5–15)
BUN: 32 mg/dL — ABNORMAL HIGH (ref 6–20)
CO2: 21 mmol/L — ABNORMAL LOW (ref 22–32)
Calcium: 9.4 mg/dL (ref 8.9–10.3)
Chloride: 99 mmol/L — ABNORMAL LOW (ref 101–111)
Creatinine, Ser: 1.26 mg/dL — ABNORMAL HIGH (ref 0.61–1.24)
GFR calc Af Amer: >60 mL/min (ref >60)
GFR calc non Af Amer: >60 mL/min (ref >60)
Glucose, Bld: 221 mg/dL — ABNORMAL HIGH (ref 65–99)
Potassium: 3.8 mmol/L (ref 3.5–5.1)
Sodium: 132 mmol/L — ABNORMAL LOW (ref 135–145)
Total Bilirubin: 1.3 mg/dL — ABNORMAL HIGH (ref 0.3–1.2)
Total Protein: 7.7 g/dL (ref 6.5–8.1)

## 2015-03-18 LAB — CBC WITH DIFFERENTIAL/PLATELET
Basophils Absolute: 0 10*3/uL (ref 0.0–0.1)
Basophils Relative: 0 % (ref 0–1)
Eosinophils Absolute: 0 10*3/uL (ref 0.0–0.7)
Eosinophils Relative: 0 % (ref 0–5)
HCT: 38.5 % — ABNORMAL LOW (ref 39.0–52.0)
Hemoglobin: 13.6 g/dL (ref 13.0–17.0)
Lymphocytes Relative: 23 % (ref 12–46)
Lymphs Abs: 1.9 10*3/uL (ref 0.7–4.0)
MCH: 31.2 pg (ref 26.0–34.0)
MCHC: 35.3 g/dL (ref 30.0–36.0)
MCV: 88.3 fL (ref 78.0–100.0)
Monocytes Absolute: 0.5 10*3/uL (ref 0.1–1.0)
Monocytes Relative: 6 % (ref 3–12)
Neutro Abs: 5.7 10*3/uL (ref 1.7–7.7)
Neutrophils Relative %: 71 % (ref 43–77)
Platelets: 164 10*3/uL (ref 150–400)
RBC: 4.36 MIL/uL (ref 4.22–5.81)
RDW: 12 % (ref 11.5–15.5)
WBC: 8.1 10*3/uL (ref 4.0–10.5)

## 2015-03-18 LAB — URINALYSIS, ROUTINE W REFLEX MICROSCOPIC
Bilirubin Urine: NEGATIVE
Glucose, UA: >1000 mg/dL — AB
Leukocytes, UA: NEGATIVE
Nitrite: NEGATIVE
Specific Gravity, Urine: 1.02 (ref 1.005–1.030)
Urobilinogen, UA: 0.2 mg/dL (ref 0.0–1.0)
pH: 5.5 (ref 5.0–8.0)

## 2015-03-18 LAB — RAPID URINE DRUG SCREEN, HOSP PERFORMED
Amphetamines: NOT DETECTED
Barbiturates: NOT DETECTED
Benzodiazepines: NOT DETECTED
Cocaine: NOT DETECTED
Opiates: NOT DETECTED
Tetrahydrocannabinol: NOT DETECTED

## 2015-03-18 LAB — LIPASE, BLOOD: Lipase: 25 U/L (ref 22–51)

## 2015-03-18 LAB — URINE MICROSCOPIC-ADD ON

## 2015-03-18 LAB — CBG MONITORING, ED: Glucose-Capillary: 151 mg/dL — ABNORMAL HIGH (ref 65–99)

## 2015-03-18 MED ORDER — HYDROMORPHONE HCL 1 MG/ML IJ SOLN
1.0000 mg | Freq: Once | INTRAMUSCULAR | Status: AC
  Administered 2015-03-18: 1 mg via INTRAVENOUS
  Filled 2015-03-18: qty 1

## 2015-03-18 MED ORDER — SODIUM CHLORIDE 0.9 % IV BOLUS (SEPSIS)
1000.0000 mL | Freq: Once | INTRAVENOUS | Status: AC
  Administered 2015-03-18: 1000 mL via INTRAVENOUS

## 2015-03-18 MED ORDER — LORAZEPAM 2 MG/ML IJ SOLN
1.0000 mg | Freq: Once | INTRAMUSCULAR | Status: AC
  Administered 2015-03-18: 1 mg via INTRAVENOUS
  Filled 2015-03-18: qty 1

## 2015-03-18 MED ORDER — METOCLOPRAMIDE HCL 5 MG/ML IJ SOLN
10.0000 mg | Freq: Once | INTRAMUSCULAR | Status: AC
  Administered 2015-03-18: 10 mg via INTRAVENOUS
  Filled 2015-03-18: qty 2

## 2015-03-18 MED ORDER — PANTOPRAZOLE SODIUM 40 MG IV SOLR
40.0000 mg | Freq: Once | INTRAVENOUS | Status: AC
  Administered 2015-03-18: 40 mg via INTRAVENOUS
  Filled 2015-03-18: qty 40

## 2015-03-18 NOTE — Discharge Instructions (Signed)

## 2015-03-18 NOTE — ED Provider Notes (Signed)
CSN: [id number]     Arrival date & time 03/18/15  1051 History   First MD Initiated Contact with Patient 03/18/15 1156     Chief Complaint  Patient presents with  . Abdominal Pain     (Consider location/radiation/quality/duration/timing/severity/associated sxs/prior Treatment) HPI Comments: Level 5 caveat.  Patient moaning and dry heaving.  States upper abdominal pain, nausea, and vomiting since last night.  Seen yesterday for same.  Felt better at discharge then pain and vomiting returned after trying to eat this morning.  Similar to previous episodes.  No hematemesis. No diarrhea or fever. No chest pain or SOB.  Hx diabetes and gastroparesis.  Did not check blood sugar. States compliance with reglan and PPI.  The history is provided by the patient. The history is limited by the condition of the patient.    Past Medical History  Diagnosis Date  . Type 2 diabetes mellitus with complications     diagnosed around age 26  . Glaucoma   . Eye hemorrhage   . Gastritis   . GERD (gastroesophageal reflux disease)   . Esophagitis   . Hiccups   . Helicobacter pylori (H. pylori) infection   . Hypertension    Past Surgical History  Procedure Laterality Date  . Eye surgery    . Esophagogastroduodenoscopy  2015    Dr. Britta Mccreedy  . Esophagogastroduodenoscopy N/A 10/26/2014    RMR: Distal esophagititis-likely reflux related although an element of pill induced injuruy no exclueded  status post biopsy. Diffusely abnormal gastric mucosa of uncertain signigicane -status post gastric biopsy. Focal area of excoriation in the cardia most consistant with a trauma of heaving.    Family History  Problem Relation Age of Onset  . Ovarian cancer Mother   . Cervical cancer Sister   . Diabetes Sister   . Colon cancer Neg Hx    History  Substance Use Topics  . Smoking status: Never Smoker   . Smokeless tobacco: Never Used  . Alcohol Use: No    Review of Systems  Constitutional: Positive for activity  change and appetite change. Negative for fever.  Respiratory: Negative for cough, chest tightness and shortness of breath.   Cardiovascular: Negative for chest pain.  Gastrointestinal: Positive for nausea, vomiting and abdominal pain.  Genitourinary: Negative for testicular pain.  Musculoskeletal: Negative for myalgias and arthralgias.  Skin: Negative for wound.  Neurological: Negative for dizziness, weakness and headaches.  A complete 10 system review of systems was obtained and all systems are negative except as noted in the HPI and PMH.      Allergies  Fish allergy; Aspirin; Bactrim; Doxycycline; Penicillins; Phenergan; and Zofran  Home Medications   Prior to Admission medications   Medication Sig Start Date End Date Taking? Authorizing Provider  brimonidine-timolol (COMBIGAN) 0.2-0.5 % ophthalmic solution Place 1 drop into both eyes at bedtime.   Yes Historical Provider, MD  Dapagliflozin Propanediol (FARXIGA) 10 MG TABS Take 10 mg by mouth daily.   Yes Historical Provider, MD  insulin aspart protamine- aspart (NOVOLOG MIX 70/30) (70-30) 100 UNIT/ML injection Inject 0.15 mLs (15 Units total) into the skin 3 (three) times daily. Patient taking differently: Inject 35 Units into the skin 3 (three) times daily.  02/20/15  Yes Orvan Falconer, MD  insulin detemir (LEVEMIR) 100 UNIT/ML injection Inject 30 Units into the skin at bedtime.   Yes Historical Provider, MD  lisinopril (PRINIVIL,ZESTRIL) 10 MG tablet Take 10 mg by mouth daily.   Yes Historical Provider, MD  metoCLOPramide (REGLAN)  10 MG tablet Take 1 tablet (10 mg total) by mouth 4 (four) times daily -  before meals and at bedtime. 02/06/15  Yes Orvil Feil, NP  pantoprazole (PROTONIX) 40 MG tablet Take 1 tablet (40 mg total) by mouth 2 (two) times daily before a meal. 01/31/15  Yes Carlis Stable, NP  potassium chloride SA (K-DUR,KLOR-CON) 20 MEQ tablet Take 20 mEq by mouth 2 (two) times daily.   Yes Historical Provider, MD  sucralfate  (CARAFATE) 1 G tablet Take 1 tablet (1 g total) by mouth 4 (four) times daily. 03/16/15  Yes Orvil Feil, NP  gabapentin (NEURONTIN) 600 MG tablet Take 600 mg by mouth daily as needed (for neuropathy).     Historical Provider, MD  HYDROcodone-acetaminophen (NORCO/VICODIN) 5-325 MG per tablet Take 1 tablet by mouth every 6 (six) hours as needed for moderate pain. Patient not taking: Reported on 02/06/2015 01/11/15   Milton Ferguson, MD  hydroxypropyl methylcellulose / hypromellose (ISOPTO TEARS / GONIOVISC) 2.5 % ophthalmic solution Place 1 drop into both eyes as needed for dry eyes.    Historical Provider, MD   BP 143/96 mmHg  Pulse 107  Temp(Src) 98.5 F (36.9 C) (Oral)  Resp 18  Ht 6' (1.829 m)  Wt 260 lb (117.935 kg)  BMI 35.25 kg/m2  SpO2 98% Physical Exam  Constitutional: He is oriented to person, place, and time. He appears well-developed and well-nourished. He appears distressed.  Moaning and dry heaving  HENT:  Head: Normocephalic and atraumatic.  Mouth/Throat: Oropharynx is clear and moist. No oropharyngeal exudate.  Eyes: Conjunctivae and EOM are normal. Pupils are equal, round, and reactive to light.  Blind L eye  Neck: Normal range of motion. Neck supple.  No meningismus.  Cardiovascular: Normal rate, regular rhythm, normal heart sounds and intact distal pulses.   No murmur heard. Pulmonary/Chest: Effort normal and breath sounds normal. No respiratory distress.  Abdominal: Soft. There is tenderness. There is no rebound and no guarding.  TTP epigastrium.  No guarding or rebound.  No Murphy's sign  Musculoskeletal: Normal range of motion. He exhibits no edema or tenderness.  Neurological: He is alert and oriented to person, place, and time. No cranial nerve deficit. He exhibits normal muscle tone. Coordination normal.  No ataxia on finger to nose bilaterally. No pronator drift. 5/5 strength throughout. CN 2-12 intact. Negative Romberg. Equal grip strength. Sensation intact. Gait is  normal.   Skin: Skin is warm.  Psychiatric: He has a normal mood and affect. His behavior is normal.  Nursing note and vitals reviewed.   ED Course  Procedures (including critical care time) Labs Review Labs Reviewed  CBC WITH DIFFERENTIAL/PLATELET - Abnormal; Notable for the following:    HCT 38.5 (*)    All other components within normal limits  COMPREHENSIVE METABOLIC PANEL - Abnormal; Notable for the following:    Sodium 132 (*)    Chloride 99 (*)    CO2 21 (*)    Glucose, Bld 221 (*)    BUN 32 (*)    Creatinine, Ser 1.26 (*)    ALT 15 (*)    Total Bilirubin 1.3 (*)    All other components within normal limits  URINALYSIS, ROUTINE W REFLEX MICROSCOPIC (NOT AT [facility]) - Abnormal; Notable for the following:    Glucose, UA >1000 (*)    Hgb urine dipstick SMALL (*)    Ketones, ur TRACE (*)    Protein, ur TRACE (*)    All other components  within normal limits  CBG MONITORING, ED - Abnormal; Notable for the following:    Glucose-Capillary 151 (*)    All other components within normal limits  LIPASE, BLOOD  URINE RAPID DRUG SCREEN, HOSP PERFORMED  URINE MICROSCOPIC-ADD ON    Imaging Review Dg Abd Acute W/chest  03/18/2015   CLINICAL DATA:  Abdominal pain and nausea for several days  EXAM: DG ABDOMEN ACUTE W/ 1V CHEST  COMPARISON:  02/18/2015  FINDINGS: Heart size normal. Lungs clear. No free air. No abnormally dilated loops of bowel. Several air-fluid levels mid abdominal small bowel nonspecific.  IMPRESSION: Nonspecific gas pattern consisting of a few air-fluid levels in mid small bowel but no dilatation to suggest obstruction.   Electronically Signed   By: Skipper Cliche M.D.   On: 03/18/2015 13:12     EKG Interpretation None      MDM   Final diagnoses:  Chronic abdominal pain   Acute on chronic epigastric pain with nausea and vomiting. No fever.  Similar visits for same with previous admissions. GI notes reviewed. Patient with EGD that showed H. pylori and  gastritis status post treatment. The parents gallbladder sludge on ultrasound with negative hydroscan a negative CT. Patient referred to [facility] for chronic abdominal pain but has not yet made an appointment.  Labs, IV fluids, IV Reglan, IV Ativan.  LFTs and lipase stable. Creatinine improving. Hyperglycemia without evidence of DKA. Workup reassuring.  Patient feeling improved in the ED with symptom control  Tolerating PO without further vomiting.  Patient states he has his Carafate, Protonix, Reglan at home. Has GI follow-up on August 17. He is aware that he does not prescribe chronic pain medications. Also he is informed that his gastroparesis is generally not treated with narcotics and can make symptoms worse. Return precautions discussed.  Ezequiel Essex, MD 03/18/15 2139

## 2015-03-18 NOTE — ED Notes (Signed)
C/o severe abdominal pain.  Rates pain 10/10 to mid upper abdomen.

## 2015-03-19 ENCOUNTER — Encounter (HOSPITAL_COMMUNITY): Payer: Self-pay

## 2015-03-19 ENCOUNTER — Emergency Department (HOSPITAL_COMMUNITY)
Admission: EM | Admit: 2015-03-19 | Discharge: 2015-03-19 | Disposition: A | Discharge status: Home / Self Care | Payer: Medicaid Other | Attending: Emergency Medicine | Admitting: Emergency Medicine

## 2015-03-19 DIAGNOSIS — K219 Gastro-esophageal reflux disease without esophagitis: Secondary | ICD-10-CM | POA: Insufficient documentation

## 2015-03-19 DIAGNOSIS — Z880 Allergy status to penicillin: Secondary | ICD-10-CM | POA: Diagnosis not present

## 2015-03-19 DIAGNOSIS — Z8669 Personal history of other diseases of the nervous system and sense organs: Secondary | ICD-10-CM | POA: Diagnosis not present

## 2015-03-19 DIAGNOSIS — Z794 Long term (current) use of insulin: Secondary | ICD-10-CM | POA: Diagnosis not present

## 2015-03-19 DIAGNOSIS — R109 Unspecified abdominal pain: Secondary | ICD-10-CM | POA: Diagnosis present

## 2015-03-19 DIAGNOSIS — E119 Type 2 diabetes mellitus without complications: Secondary | ICD-10-CM | POA: Diagnosis not present

## 2015-03-19 DIAGNOSIS — Z8619 Personal history of other infectious and parasitic diseases: Secondary | ICD-10-CM | POA: Diagnosis not present

## 2015-03-19 DIAGNOSIS — R112 Nausea with vomiting, unspecified: Secondary | ICD-10-CM | POA: Insufficient documentation

## 2015-03-19 DIAGNOSIS — I10 Essential (primary) hypertension: Secondary | ICD-10-CM | POA: Insufficient documentation

## 2015-03-19 DIAGNOSIS — Z79899 Other long term (current) drug therapy: Secondary | ICD-10-CM | POA: Insufficient documentation

## 2015-03-19 DIAGNOSIS — R1110 Vomiting, unspecified: Secondary | ICD-10-CM

## 2015-03-19 LAB — COMPREHENSIVE METABOLIC PANEL
ALT: 12 U/L — ABNORMAL LOW (ref 17–63)
AST: 17 U/L (ref 15–41)
Albumin: 4.1 g/dL (ref 3.5–5.0)
Alkaline Phosphatase: 43 U/L (ref 38–126)
Anion gap: 12 (ref 5–15)
BUN: 26 mg/dL — ABNORMAL HIGH (ref 6–20)
CO2: 24 mmol/L (ref 22–32)
Calcium: 9.2 mg/dL (ref 8.9–10.3)
Chloride: 98 mmol/L — ABNORMAL LOW (ref 101–111)
Creatinine, Ser: 1.28 mg/dL — ABNORMAL HIGH (ref 0.61–1.24)
GFR calc Af Amer: >60 mL/min (ref >60)
GFR calc non Af Amer: >60 mL/min (ref >60)
Glucose, Bld: 192 mg/dL — ABNORMAL HIGH (ref 65–99)
Potassium: 4 mmol/L (ref 3.5–5.1)
Sodium: 134 mmol/L — ABNORMAL LOW (ref 135–145)
Total Bilirubin: 1 mg/dL (ref 0.3–1.2)
Total Protein: 7.5 g/dL (ref 6.5–8.1)

## 2015-03-19 LAB — CBC WITH DIFFERENTIAL/PLATELET
Basophils Absolute: 0 10*3/uL (ref 0.0–0.1)
Basophils Relative: 0 % (ref 0–1)
Eosinophils Absolute: 0 10*3/uL (ref 0.0–0.7)
Eosinophils Relative: 0 % (ref 0–5)
HCT: 38.9 % — ABNORMAL LOW (ref 39.0–52.0)
Hemoglobin: 13.9 g/dL (ref 13.0–17.0)
Lymphocytes Relative: 22 % (ref 12–46)
Lymphs Abs: 1.6 10*3/uL (ref 0.7–4.0)
MCH: 31.7 pg (ref 26.0–34.0)
MCHC: 35.7 g/dL (ref 30.0–36.0)
MCV: 88.6 fL (ref 78.0–100.0)
Monocytes Absolute: 0.3 10*3/uL (ref 0.1–1.0)
Monocytes Relative: 5 % (ref 3–12)
Neutro Abs: 5.1 10*3/uL (ref 1.7–7.7)
Neutrophils Relative %: 73 % (ref 43–77)
Platelets: 154 10*3/uL (ref 150–400)
RBC: 4.39 MIL/uL (ref 4.22–5.81)
RDW: 11.9 % (ref 11.5–15.5)
WBC: 7.1 10*3/uL (ref 4.0–10.5)

## 2015-03-19 LAB — I-STAT CG4 LACTIC ACID, ED: Lactic Acid, Venous: 1.22 mmol/L (ref 0.5–2.0)

## 2015-03-19 LAB — LIPASE, BLOOD: Lipase: 25 U/L (ref 22–51)

## 2015-03-19 MED ORDER — DIPHENHYDRAMINE HCL 50 MG/ML IJ SOLN
25.0000 mg | Freq: Once | INTRAMUSCULAR | Status: AC
  Administered 2015-03-19: 25 mg via INTRAVENOUS
  Filled 2015-03-19 (×2): qty 1

## 2015-03-19 MED ORDER — METOCLOPRAMIDE HCL 5 MG/ML IJ SOLN
10.0000 mg | Freq: Once | INTRAMUSCULAR | Status: AC
  Administered 2015-03-19: 10 mg via INTRAVENOUS
  Filled 2015-03-19: qty 2

## 2015-03-19 MED ORDER — SODIUM CHLORIDE 0.9 % IV BOLUS (SEPSIS)
1000.0000 mL | Freq: Once | INTRAVENOUS | Status: AC
  Administered 2015-03-19: 1000 mL via INTRAVENOUS

## 2015-03-19 MED ORDER — MORPHINE SULFATE 4 MG/ML IJ SOLN
4.0000 mg | Freq: Once | INTRAMUSCULAR | Status: AC
  Administered 2015-03-19: 4 mg via INTRAVENOUS
  Filled 2015-03-19: qty 1

## 2015-03-19 MED ORDER — SCOPOLAMINE 1 MG/3DAYS TD PT72
1.0000 | MEDICATED_PATCH | TRANSDERMAL | Status: DC
  Administered 2015-03-19: 1.5 mg via TRANSDERMAL
  Filled 2015-03-19: qty 1

## 2015-03-19 MED ORDER — PANTOPRAZOLE SODIUM 40 MG IV SOLR
40.0000 mg | Freq: Once | INTRAVENOUS | Status: AC
  Administered 2015-03-19: 40 mg via INTRAVENOUS
  Filled 2015-03-19: qty 40

## 2015-03-19 MED ORDER — METOCLOPRAMIDE HCL 10 MG PO TABS: 10.0000 mg | ORAL_TABLET | Freq: Four times a day (QID) | ORAL | Status: DC

## 2015-03-19 MED ORDER — SODIUM CHLORIDE 0.9 % IV SOLN
Freq: Once | INTRAVENOUS | Status: AC
  Administered 2015-03-19: 15:00:00 via INTRAVENOUS

## 2015-03-19 NOTE — ED Notes (Signed)
MD at bedside. 

## 2015-03-19 NOTE — ED Notes (Signed)
Pt having dry heaves. Pain 10/10

## 2015-03-19 NOTE — Discharge Instructions (Signed)

## 2015-03-19 NOTE — Progress Notes (Signed)
03/19/15 Pt has gastroparesis.  CBG 192 .  Currently in ED.  Spoke with nurse.  If pt is staying in hospital he will need orders for his DM.  Home DM meds:  Levemir 30 units daily, 70/30 15 units 3 times per day.  Based on previous chart notes pt takes 35 units of 70/30  3 times per day. [place], CDE. M.Ed. Pager [phone] Inpatient Diabetes Coordinator

## 2015-03-19 NOTE — ED Provider Notes (Signed)
CSN: [id number]     Arrival date & time 03/19/15  1134 History   First MD Initiated Contact with Patient 03/19/15 1208     Chief Complaint  Patient presents with  . Abdominal Pain      HPI  History evaluation of abdominal pain nausea and vomiting. Third visit in 2 days. Left last night feeling well. States he tried eat some "bread and crackers" when he got home and started vomiting again. He clarifies upon questioning is not actually vomiting but feels like he is retching "my chest feels like to vomit but I don't". Denies chest pain. Denies abdominal pain as cramping. Some diarrhea. No blood pus or mucus. No actual emesis today.  Past Medical History  Diagnosis Date  . Type 2 diabetes mellitus with complications     diagnosed around age 61  . Glaucoma   . Eye hemorrhage   . Gastritis   . GERD (gastroesophageal reflux disease)   . Esophagitis   . Hiccups   . Helicobacter pylori (H. pylori) infection   . Hypertension    Past Surgical History  Procedure Laterality Date  . Eye surgery    . Esophagogastroduodenoscopy  2015    Dr. Britta Mccreedy  . Esophagogastroduodenoscopy N/A 10/26/2014    RMR: Distal esophagititis-likely reflux related although an element of pill induced injuruy no exclueded  status post biopsy. Diffusely abnormal gastric mucosa of uncertain signigicane -status post gastric biopsy. Focal area of excoriation in the cardia most consistant with a trauma of heaving.    Family History  Problem Relation Age of Onset  . Ovarian cancer Mother   . Cervical cancer Sister   . Diabetes Sister   . Colon cancer Neg Hx    History  Substance Use Topics  . Smoking status: Never Smoker   . Smokeless tobacco: Never Used  . Alcohol Use: No    Review of Systems  Constitutional: Negative for fever, chills, diaphoresis, appetite change and fatigue.  HENT: Negative for mouth sores, sore throat and trouble swallowing.   Eyes: Negative for visual disturbance.  Respiratory: Negative for  cough, chest tightness, shortness of breath and wheezing.   Cardiovascular: Negative for chest pain.  Gastrointestinal: Positive for nausea and abdominal pain. Negative for vomiting, diarrhea and abdominal distention.  Endocrine: Negative for polydipsia, polyphagia and polyuria.  Genitourinary: Negative for dysuria, frequency, hematuria and decreased urine volume.  Musculoskeletal: Negative for gait problem.  Skin: Negative for color change, pallor and rash.  Neurological: Negative for dizziness, syncope, light-headedness and headaches.  Hematological: Does not bruise/bleed easily.  Psychiatric/Behavioral: Negative for behavioral problems and confusion.      Allergies  Fish allergy; Aspirin; Bactrim; Doxycycline; Penicillins; Phenergan; and Zofran  Home Medications   Prior to Admission medications   Medication Sig Start Date End Date Taking? Authorizing Provider  brimonidine-timolol (COMBIGAN) 0.2-0.5 % ophthalmic solution Place 1 drop into both eyes at bedtime.   Yes Historical Provider, MD  Dapagliflozin Propanediol (FARXIGA) 10 MG TABS Take 10 mg by mouth daily.   Yes Historical Provider, MD  gabapentin (NEURONTIN) 600 MG tablet Take 600 mg by mouth daily as needed (for neuropathy).    Yes Historical Provider, MD  hydroxypropyl methylcellulose / hypromellose (ISOPTO TEARS / GONIOVISC) 2.5 % ophthalmic solution Place 1 drop into both eyes as needed for dry eyes.   Yes Historical Provider, MD  insulin aspart protamine- aspart (NOVOLOG MIX 70/30) (70-30) 100 UNIT/ML injection Inject 0.15 mLs (15 Units total) into the skin 3 (three) times  daily. Patient taking differently: Inject 35 Units into the skin 3 (three) times daily.  02/20/15  Yes Orvan Falconer, MD  insulin detemir (LEVEMIR) 100 UNIT/ML injection Inject 30 Units into the skin at bedtime.   Yes Historical Provider, MD  lisinopril (PRINIVIL,ZESTRIL) 10 MG tablet Take 20 mg by mouth daily.    Yes Historical Provider, MD  pantoprazole  (PROTONIX) 40 MG tablet Take 1 tablet (40 mg total) by mouth 2 (two) times daily before a meal. 01/31/15  Yes Carlis Stable, NP  potassium chloride SA (K-DUR,KLOR-CON) 20 MEQ tablet Take 20 mEq by mouth 2 (two) times daily.   Yes Historical Provider, MD  sucralfate (CARAFATE) 1 G tablet Take 1 tablet (1 g total) by mouth 4 (four) times daily. 03/16/15  Yes Orvil Feil, NP  HYDROcodone-acetaminophen (NORCO/VICODIN) 5-325 MG per tablet Take 1 tablet by mouth every 6 (six) hours as needed for moderate pain. Patient not taking: Reported on 02/06/2015 01/11/15   Milton Ferguson, MD  metoCLOPramide (REGLAN) 10 MG tablet Take 1 tablet (10 mg total) by mouth 4 (four) times daily. 03/19/15   Tanna Furry, MD   BP 114/90 mmHg  Pulse 107  Temp(Src) 97 F (36.1 C) (Oral)  Resp 18  Ht 6' (1.829 m)  Wt 260 lb (117.935 kg)  BMI 35.25 kg/m2  SpO2 99% Physical Exam  Constitutional: He is oriented to person, place, and time. He appears well-developed and well-nourished. No distress.  Not tachypneic. No kussmaul respirations. normal mentation.  HENT:  Head: Normocephalic.  Eyes: Conjunctivae are normal. Pupils are equal, round, and reactive to light. No scleral icterus.  Neck: Normal range of motion. Neck supple. No thyromegaly present.  Cardiovascular: Normal rate and regular rhythm.  Exam reveals no gallop and no friction rub.   No murmur heard. Pulmonary/Chest: Effort normal and breath sounds normal. No respiratory distress. He has no decreased breath sounds. He has no wheezes. He has no rales.  Abdominal: Soft. Bowel sounds are normal. He exhibits no distension. There is no tenderness. There is no rebound.  Soft benign abdomen. Normal active bowel sounds.  Musculoskeletal: Normal range of motion.  Neurological: He is alert and oriented to person, place, and time.  Skin: Skin is warm and dry. No rash noted.  Psychiatric: He has a normal mood and affect. His behavior is normal.    ED Course  Procedures  (including critical care time) Labs Review Labs Reviewed  CBC WITH DIFFERENTIAL/PLATELET - Abnormal; Notable for the following:    HCT 38.9 (*)    All other components within normal limits  COMPREHENSIVE METABOLIC PANEL - Abnormal; Notable for the following:    Sodium 134 (*)    Chloride 98 (*)    Glucose, Bld 192 (*)    BUN 26 (*)    Creatinine, Ser 1.28 (*)    ALT 12 (*)    All other components within normal limits  LIPASE, BLOOD  I-STAT CG4 LACTIC ACID, ED    Imaging Review Dg Abd Acute W/chest  03/18/2015   CLINICAL DATA:  Abdominal pain and nausea for several days  EXAM: DG ABDOMEN ACUTE W/ 1V CHEST  COMPARISON:  02/18/2015  FINDINGS: Heart size normal. Lungs clear. No free air. No abnormally dilated loops of bowel. Several air-fluid levels mid abdominal small bowel nonspecific.  IMPRESSION: Nonspecific gas pattern consisting of a few air-fluid levels in mid small bowel but no dilatation to suggest obstruction.   Electronically Signed   By: Skipper Cliche  M.D.   On: 03/18/2015 13:12     EKG Interpretation None      MDM   Final diagnoses:  Intractable vomiting with nausea, vomiting of unspecified type    Although his third visit in 36 hours patient is not actually vomiting. States he "retches" but not regurgitating at home or here. His labs appear acceptable. His given IV fluids. Given a scopolamine patch which seemed to greatly help his cramping and nausea. We discussed admission versus home. He had a strong desire to go home. He was taking some crackers and clear liquids here before discharge.    Tanna Furry, MD 03/19/15 2237

## 2015-03-19 NOTE — ED Notes (Signed)
Pt has been seen here 2 other times in past 2 days for same - Stated that this started back again -

## 2015-03-28 ENCOUNTER — Other Ambulatory Visit: Payer: Self-pay

## 2015-03-28 ENCOUNTER — Encounter: Payer: Self-pay | Admitting: Nurse Practitioner

## 2015-03-28 ENCOUNTER — Ambulatory Visit (INDEPENDENT_AMBULATORY_CARE_PROVIDER_SITE_OTHER): Payer: Medicaid Other | Admitting: Nurse Practitioner

## 2015-03-28 VITALS — BP 129/81 | HR 88 | Temp 98.1°F | Ht 72.0 in | Wt 256.2 lb

## 2015-03-28 DIAGNOSIS — K3184 Gastroparesis: Secondary | ICD-10-CM

## 2015-03-28 DIAGNOSIS — R1013 Epigastric pain: Secondary | ICD-10-CM

## 2015-03-28 NOTE — Assessment & Plan Note (Signed)
Patient with continued pain which is cyclic in nature. He will have 3 weeks of exacerbated symptoms with pain occurring twice a day and am will improve for 2-3 weeks. Pain seems to be worsening and is occurring with nausea as well as. Is recently admitted to [facility] and has a follow-up appointment in 1 month with [facility]. Nutritionist referral as noted above, return for follow-up in 6 months after evaluation at [facility].

## 2015-03-28 NOTE — Patient Instructions (Signed)
1. We will refer you to a nutritionist for assistance with gastroparesis diet. 2. Keep your appointment at [facility]. 3. Return for follow-up in 6 months.

## 2015-03-28 NOTE — Progress Notes (Signed)
Referring Provider: Tawni Carnes, PA-C Primary Care Physician:  Jeri Modena Primary GI:  Dr. Gala Romney  Chief Complaint  Patient presents with  . Follow-up    HPI:   44 year old male presents for follow-up on epigastric abdominal pain and gastroparesis due to diabetes. History of H. pylori gastritis most recently in March 2016 status post treatment. Ultrasound abdomen with sludge, HIDA normal. CT abdomen benign. At last office visit on 02/06/2015 recommend referral to [facility] for further evaluation of chronic abdominal pain. He is also scheduled to see Dr. Arnoldo Morale and surgery on June 30 to discuss possible biliary etiology. At that visit recommended continue PPI and Carafate. Was unable to test for eradication at that time due to inability to hold PPI for 14 days. Was recently admitted to the hospital 02/17/2015 for dehydration and intractable nausea and vomiting. He has been to the emergency room 3 times in the past month for gastroparesis and nausea/vomiting. Per Care Everywhere the patient has an upcoming appointment at [place] [facility] on 04/30/15.  Today he states he was admitted to [facility] last week. They did a fecal H. Pylori stool sample for erradication testing. He states they told him the same thing that he has been told here. States his pain is getting worse. Per Care Everywhere his H. Pylori stool anigen was negative. Pain is still epigastric and is described as sharp pain that is intermittent. Last a1c 7.3. Still taking Reglan. Uses carafate qid which he states does help. Still on Protonix. Hiccups are better. Nausea gets worse with onset of pain. Pain/nausea episodes are cyclic and happen about 2 times a day for 2-3 weeks, then will back off for a few weeks at which point the cycle typically recurs. His pain happens in the morning and at another time during the day. Saw Dr. Arnoldo Morale in surgery and was told his gallbladder looks ok and not recommending surgery at this time.  Denies melena, hematochezia, other abdominal pain. Denies chest pain, dyspnea, dizziness, lightheadedness, syncope, near syncope. Denies any other upper or lower GI symptoms.  Past Medical History  Diagnosis Date  . Type 2 diabetes mellitus with complications     diagnosed around age 36  . Glaucoma   . Eye hemorrhage   . Gastritis   . GERD (gastroesophageal reflux disease)   . Esophagitis   . Hiccups   . Helicobacter pylori (H. pylori) infection   . Hypertension     Past Surgical History  Procedure Laterality Date  . Eye surgery    . Esophagogastroduodenoscopy  2015    Dr. Britta Mccreedy  . Esophagogastroduodenoscopy N/A 10/26/2014    RMR: Distal esophagititis-likely reflux related although an element of pill induced injuruy no exclueded  status post biopsy. Diffusely abnormal gastric mucosa of uncertain signigicane -status post gastric biopsy. Focal area of excoriation in the cardia most consistant with a trauma of heaving.     Current Outpatient Prescriptions  Medication Sig Dispense Refill  . brimonidine-timolol (COMBIGAN) 0.2-0.5 % ophthalmic solution Place 1 drop into both eyes at bedtime.    . Dapagliflozin Propanediol (FARXIGA) 10 MG TABS Take 10 mg by mouth daily.    [name] gabapentin (NEURONTIN) 600 MG tablet Take 600 mg by mouth daily as needed (for neuropathy).     . hydroxypropyl methylcellulose / hypromellose (ISOPTO TEARS / GONIOVISC) 2.5 % ophthalmic solution Place 1 drop into both eyes as needed for dry eyes.    . insulin aspart protamine- aspart (NOVOLOG MIX 70/30) (70-30) 100 UNIT/ML injection  Inject 0.15 mLs (15 Units total) into the skin 3 (three) times daily. (Patient taking differently: Inject 35 Units into the skin 3 (three) times daily. ) 10 mL 11  . insulin detemir (LEVEMIR) 100 UNIT/ML injection Inject 30 Units into the skin at bedtime.    [name] lisinopril (PRINIVIL,ZESTRIL) 10 MG tablet Take 20 mg by mouth daily.     . metoCLOPramide (REGLAN) 10 MG tablet Take 1 tablet (10  mg total) by mouth 4 (four) times daily. 20 tablet 1  . pantoprazole (PROTONIX) 40 MG tablet Take 1 tablet (40 mg total) by mouth 2 (two) times daily before a meal. 60 tablet 3  . potassium chloride SA (K-DUR,KLOR-CON) 20 MEQ tablet Take 20 mEq by mouth 2 (two) times daily.    . sucralfate (CARAFATE) 1 G tablet Take 1 tablet (1 g total) by mouth 4 (four) times daily. 120 tablet 0  . HYDROcodone-acetaminophen (NORCO/VICODIN) 5-325 MG per tablet Take 1 tablet by mouth every 6 (six) hours as needed for moderate pain. (Patient not taking: Reported on 02/06/2015) 10 tablet 0   No current facility-administered medications for this visit.    Allergies as of 03/28/2015 - Review Complete 03/28/2015  Allergen Reaction Noted  . Fish allergy Anaphylaxis, Hives, and Rash 01/14/2015  . Aspirin Hives and Itching 03/22/2009  . Bactrim [sulfamethoxazole-trimethoprim] Itching 12/06/2014  . Doxycycline Hives and Itching 03/20/2014  . Penicillins Hives and Itching 03/22/2009  . Phenergan [promethazine hcl] Nausea And Vomiting 10/27/2014  . Zofran [ondansetron hcl] Other (See Comments) 10/27/2014    Family History  Problem Relation Age of Onset  . Ovarian cancer Mother   . Cervical cancer Sister   . Diabetes Sister   . Colon cancer Neg Hx     Social History   Social History  . Marital Status: Single    Spouse Name: N/A  . Number of Children: N/A  . Years of Education: N/A   Social History Main Topics  . Smoking status: Never Smoker   . Smokeless tobacco: Never Used  . Alcohol Use: No  . Drug Use: No  . Sexual Activity: Not Asked   Other Topics Concern  . None   Social History Narrative    Review of Systems: General: Negative for anorexia, weight loss, fever, chills, fatigue, weakness. CV: Negative for chest pain, angina, palpitations, dyspnea on exertion, peripheral edema.  Respiratory: Negative for dyspnea at rest, dyspnea on exertion, cough, sputum, wheezing.  GI: See history of  present illness. Endo: Negative for unusual weight change.  Heme: Negative for bruising or bleeding.   Physical Exam: BP 129/81 mmHg  Pulse 88  Temp(Src) 98.1 F (36.7 C)  Ht 6' (1.829 m)  Wt 256 lb 3.2 oz (116.212 kg)  BMI 34.74 kg/m2 General:   Alert and oriented. Pleasant and cooperative. Well-nourished and well-developed.  Head:  Normocephalic and atraumatic. Cardiovascular:  S1, S2 present without murmurs appreciated. Normal pulses noted. Extremities without clubbing or edema. Respiratory:  Clear to auscultation bilaterally. No wheezes, rales, or rhonchi. No distress.  Gastrointestinal:  +BS, soft, and non-distended. Mild TTP epigastric region. No HSM noted. No guarding or rebound. No masses appreciated.  Rectal:  Deferred  Neurologic:  Alert and oriented x4;  grossly normal neurologically. Psych:  Alert and cooperative. Normal mood and affect. Heme/Lymph/Immune: No excessive bruising noted.    03/28/2015 10:07 AM

## 2015-03-28 NOTE — Progress Notes (Signed)
cc'ed to pcp °

## 2015-03-28 NOTE — Assessment & Plan Note (Signed)
Patient with continued symptoms. Hemoglobin A1c appears to be improved. He is currently on Reglan which is helping. He has a follow-up appointment with [facility] in 1 month. At this point I offered and he accepted a referral to a nutritionist to improve his diet for gastroparesis. Return for follow-up in 6 months.

## 2015-03-28 NOTE — Progress Notes (Signed)
CC'ED TO PCP 

## 2015-04-17 ENCOUNTER — Telehealth: Payer: Self-pay | Admitting: Internal Medicine

## 2015-04-17 NOTE — Telephone Encounter (Signed)
Routing to the refill box. 

## 2015-04-17 NOTE — Telephone Encounter (Signed)
Patient called today saying he needs a refill of his carafate. I told him to have his pharmacy fax [place] the refill request and he refused. He uses [facility].

## 2015-04-19 ENCOUNTER — Emergency Department (HOSPITAL_COMMUNITY)
Admission: EM | Admit: 2015-04-19 | Discharge: 2015-04-19 | Disposition: A | Discharge status: Home / Self Care | Payer: Medicaid Other | Attending: Emergency Medicine | Admitting: Emergency Medicine

## 2015-04-19 ENCOUNTER — Encounter (HOSPITAL_COMMUNITY): Payer: Self-pay | Admitting: Emergency Medicine

## 2015-04-19 DIAGNOSIS — Z79899 Other long term (current) drug therapy: Secondary | ICD-10-CM | POA: Diagnosis not present

## 2015-04-19 DIAGNOSIS — Z880 Allergy status to penicillin: Secondary | ICD-10-CM | POA: Insufficient documentation

## 2015-04-19 DIAGNOSIS — Z8619 Personal history of other infectious and parasitic diseases: Secondary | ICD-10-CM | POA: Diagnosis not present

## 2015-04-19 DIAGNOSIS — Z8669 Personal history of other diseases of the nervous system and sense organs: Secondary | ICD-10-CM | POA: Diagnosis not present

## 2015-04-19 DIAGNOSIS — I10 Essential (primary) hypertension: Secondary | ICD-10-CM | POA: Diagnosis not present

## 2015-04-19 DIAGNOSIS — E1143 Type 2 diabetes mellitus with diabetic autonomic (poly)neuropathy: Secondary | ICD-10-CM | POA: Insufficient documentation

## 2015-04-19 DIAGNOSIS — K210 Gastro-esophageal reflux disease with esophagitis: Secondary | ICD-10-CM | POA: Insufficient documentation

## 2015-04-19 DIAGNOSIS — Z794 Long term (current) use of insulin: Secondary | ICD-10-CM | POA: Insufficient documentation

## 2015-04-19 DIAGNOSIS — R1010 Upper abdominal pain, unspecified: Secondary | ICD-10-CM | POA: Diagnosis present

## 2015-04-19 DIAGNOSIS — K3184 Gastroparesis: Secondary | ICD-10-CM

## 2015-04-19 LAB — CBC
HCT: 37.4 % — ABNORMAL LOW (ref 39.0–52.0)
Hemoglobin: 13.2 g/dL (ref 13.0–17.0)
MCH: 31.6 pg (ref 26.0–34.0)
MCHC: 35.3 g/dL (ref 30.0–36.0)
MCV: 89.5 fL (ref 78.0–100.0)
Platelets: 156 10*3/uL (ref 150–400)
RBC: 4.18 MIL/uL — ABNORMAL LOW (ref 4.22–5.81)
RDW: 12.8 % (ref 11.5–15.5)
WBC: 7.6 10*3/uL (ref 4.0–10.5)

## 2015-04-19 LAB — BASIC METABOLIC PANEL
Anion gap: 5 (ref 5–15)
BUN: 10 mg/dL (ref 6–20)
CO2: 26 mmol/L (ref 22–32)
Calcium: 8.5 mg/dL — ABNORMAL LOW (ref 8.9–10.3)
Chloride: 106 mmol/L (ref 101–111)
Creatinine, Ser: 0.89 mg/dL (ref 0.61–1.24)
GFR calc Af Amer: >60 mL/min (ref >60)
GFR calc non Af Amer: >60 mL/min (ref >60)
Glucose, Bld: 172 mg/dL — ABNORMAL HIGH (ref 65–99)
Potassium: 3.7 mmol/L (ref 3.5–5.1)
Sodium: 137 mmol/L (ref 135–145)

## 2015-04-19 LAB — CBG MONITORING, ED: Glucose-Capillary: 117 mg/dL — ABNORMAL HIGH (ref 65–99)

## 2015-04-19 MED ORDER — SODIUM CHLORIDE 0.9 % IV SOLN
1000.0000 mL | Freq: Once | INTRAVENOUS | Status: AC
  Administered 2015-04-19: 1000 mL via INTRAVENOUS

## 2015-04-19 MED ORDER — OXYCODONE-ACETAMINOPHEN 5-325 MG PO TABS: 1.0000 | ORAL_TABLET | Freq: Four times a day (QID) | ORAL | Status: DC | PRN

## 2015-04-19 MED ORDER — HYDROMORPHONE HCL 1 MG/ML IJ SOLN
1.0000 mg | Freq: Once | INTRAMUSCULAR | Status: AC
  Administered 2015-04-19: 1 mg via INTRAVENOUS
  Filled 2015-04-19: qty 1

## 2015-04-19 MED ORDER — SODIUM CHLORIDE 0.9 % IV BOLUS (SEPSIS)
1000.0000 mL | Freq: Once | INTRAVENOUS | Status: AC
  Administered 2015-04-19: 1000 mL via INTRAVENOUS

## 2015-04-19 NOTE — ED Notes (Signed)
Pt reports upper abdominal pain with nausea. Pt states he is having issues from gastroparesis. Pt reports bowel movements normal.

## 2015-04-19 NOTE — ED Provider Notes (Signed)
CSN: [id number]     Arrival date & time 04/19/15  1103 History  This chart was scribed for Timothy Christen, MD by Irene Pap, ED Scribe. This patient was seen in room APA10/APA10 and patient care was started at 11:25 AM.   Chief Complaint  Patient presents with  . Abdominal Pain   The history is provided by the patient. No language interpreter was used.   HPI Comments: Timothy Clark is a 44 y.o. Male with hx of Type 2 DM, GERD, Esophagitis, HTN, gastritis, and H-Pylori infection who presents to the Emergency Department complaining of severe,sharp upper abdominal pain with nausea. Pt states that he is having problems with his gastroparesis but states that the flare-up feels different from his past hx. He states that he was on [place] for his DM, but has stopped it 3 weeks ago. Pt denies eating today or vomiting. Pt states he is due to have a "stomach emptying" test for his gastroparesis in October.   Past Medical History  Diagnosis Date  . Type 2 diabetes mellitus with complications     diagnosed around age 65  . Glaucoma   . Eye hemorrhage   . Gastritis   . GERD (gastroesophageal reflux disease)   . Esophagitis   . Hiccups   . Helicobacter pylori (H. pylori) infection   . Hypertension    Past Surgical History  Procedure Laterality Date  . Eye surgery    . Esophagogastroduodenoscopy  2015    Dr. Britta Mccreedy  . Esophagogastroduodenoscopy N/A 10/26/2014    RMR: Distal esophagititis-likely reflux related although an element of pill induced injuruy no exclueded  status post biopsy. Diffusely abnormal gastric mucosa of uncertain signigicane -status post gastric biopsy. Focal area of excoriation in the cardia most consistant with a trauma of heaving.   . Eye surgery     Family History  Problem Relation Age of Onset  . Ovarian cancer Mother   . Cervical cancer Sister   . Diabetes Sister   . Colon cancer Neg Hx    Social History  Substance Use Topics  . Smoking status: Never Smoker   .  Smokeless tobacco: Never Used  . Alcohol Use: No    Review of Systems  Gastrointestinal: Positive for nausea and abdominal pain. Negative for vomiting.  All other systems reviewed and are negative.  Allergies  Fish allergy; Aspirin; Bactrim; Doxycycline; Penicillins; Phenergan; and Zofran  Home Medications   Prior to Admission medications   Medication Sig Start Date End Date Taking? Authorizing Provider  brimonidine-timolol (COMBIGAN) 0.2-0.5 % ophthalmic solution Place 1 drop into both eyes at bedtime.   Yes Historical Provider, MD  gabapentin (NEURONTIN) 600 MG tablet Take 600 mg by mouth daily as needed (for neuropathy).    Yes Historical Provider, MD  hydroxypropyl methylcellulose / hypromellose (ISOPTO TEARS / GONIOVISC) 2.5 % ophthalmic solution Place 1 drop into both eyes as needed for dry eyes.   Yes Historical Provider, MD  insulin aspart protamine- aspart (NOVOLOG MIX 70/30) (70-30) 100 UNIT/ML injection Inject 0.15 mLs (15 Units total) into the skin 3 (three) times daily. Patient taking differently: Inject 35 Units into the skin 3 (three) times daily.  02/20/15  Yes Orvan Falconer, MD  insulin detemir (LEVEMIR) 100 UNIT/ML injection Inject 30 Units into the skin at bedtime.   Yes Historical Provider, MD  lisinopril (PRINIVIL,ZESTRIL) 10 MG tablet Take 20 mg by mouth daily.    Yes Historical Provider, MD  metoCLOPramide (REGLAN) 10 MG tablet Take 1  tablet (10 mg total) by mouth 4 (four) times daily. 03/19/15  Yes Tanna Furry, MD  pantoprazole (PROTONIX) 40 MG tablet Take 1 tablet (40 mg total) by mouth 2 (two) times daily before a meal. 01/31/15  Yes Carlis Stable, NP  sucralfate (CARAFATE) 1 G tablet Take 1 tablet (1 g total) by mouth 4 (four) times daily. 03/16/15  Yes Orvil Feil, NP  oxyCODONE-acetaminophen (PERCOCET/ROXICET) 5-325 MG per tablet Take 1-2 tablets by mouth every 6 (six) hours as needed. 04/19/15   Timothy Christen, MD   BP 170/93 mmHg  Pulse 89  Temp(Src) 98.2 F (36.8 C) (Oral)   Resp 14  Ht 6' (1.829 m)  Wt 269 lb (122.018 kg)  BMI 36.48 kg/m2  SpO2 100%  Physical Exam  Constitutional: He is oriented to person, place, and time. He appears well-developed and well-nourished.  HENT:  Head: Normocephalic and atraumatic.  Eyes: Conjunctivae and EOM are normal. Pupils are equal, round, and reactive to light.  Neck: Normal range of motion. Neck supple.  Cardiovascular: Normal rate and regular rhythm.   Pulmonary/Chest: Effort normal and breath sounds normal.  Abdominal: Soft. Bowel sounds are normal. There is tenderness.  Minimal epigastric tenderness  Musculoskeletal: Normal range of motion.  Neurological: He is alert and oriented to person, place, and time.  Skin: Skin is warm and dry.  Psychiatric: He has a normal mood and affect. His behavior is normal.  Nursing note and vitals reviewed.   ED Course  Procedures (including critical care time) DIAGNOSTIC STUDIES: Oxygen Saturation is 100% on RA, normal by my interpretation.    COORDINATION OF CARE: 11:29 AM-Discussed treatment plan which includes labs and IV with pt at bedside and pt agreed to plan.     Labs Review Labs Reviewed  BASIC METABOLIC PANEL - Abnormal; Notable for the following:    Glucose, Bld 172 (*)    Calcium 8.5 (*)    All other components within normal limits  CBC - Abnormal; Notable for the following:    RBC 4.18 (*)    HCT 37.4 (*)    All other components within normal limits  CBG MONITORING, ED - Abnormal; Notable for the following:    Glucose-Capillary 117 (*)    All other components within normal limits    Imaging Review No results found. I have personally reviewed and evaluated these images and lab results as part of my medical decision-making.   EKG Interpretation None      MDM   Final diagnoses:  Gastroparesis due to DM   Patient is regular visitor to the emergency department for gastroparesis issues.  He presents today with his normal epigastric pain. He  feels better after IV fluids and pain management. He is not in DKA. Discharge medications Percocet.  I personally performed the services described in this documentation, which was scribed in my presence. The recorded information has been reviewed and is accurate.    Timothy Christen, MD 04/19/15 [phone]

## 2015-04-19 NOTE — Discharge Instructions (Signed)
Prescription for pain medicine. Continue your normal stomach medications. Try to follow-up with your primary care doctor

## 2015-04-20 MED ORDER — SUCRALFATE 1 G PO TABS
1.0000 g | ORAL_TABLET | Freq: Four times a day (QID) | ORAL | Status: DC
Start: 2015-04-20 — End: 2015-08-29

## 2015-04-20 NOTE — Addendum Note (Signed)
Addended by: Mahala Menghini on: 04/20/2015 01:06 PM   Modules accepted: Orders

## 2015-04-21 ENCOUNTER — Encounter (HOSPITAL_COMMUNITY): Payer: Self-pay | Admitting: Emergency Medicine

## 2015-04-21 ENCOUNTER — Emergency Department (HOSPITAL_COMMUNITY)
Admission: EM | Admit: 2015-04-21 | Discharge: 2015-04-21 | Disposition: A | Discharge status: Home / Self Care | Payer: Medicaid Other | Attending: Emergency Medicine | Admitting: Emergency Medicine

## 2015-04-21 DIAGNOSIS — K219 Gastro-esophageal reflux disease without esophagitis: Secondary | ICD-10-CM | POA: Diagnosis not present

## 2015-04-21 DIAGNOSIS — Z880 Allergy status to penicillin: Secondary | ICD-10-CM | POA: Diagnosis not present

## 2015-04-21 DIAGNOSIS — R109 Unspecified abdominal pain: Secondary | ICD-10-CM | POA: Diagnosis present

## 2015-04-21 DIAGNOSIS — H409 Unspecified glaucoma: Secondary | ICD-10-CM | POA: Insufficient documentation

## 2015-04-21 DIAGNOSIS — E119 Type 2 diabetes mellitus without complications: Secondary | ICD-10-CM | POA: Insufficient documentation

## 2015-04-21 DIAGNOSIS — Z794 Long term (current) use of insulin: Secondary | ICD-10-CM | POA: Diagnosis not present

## 2015-04-21 DIAGNOSIS — Z8619 Personal history of other infectious and parasitic diseases: Secondary | ICD-10-CM | POA: Insufficient documentation

## 2015-04-21 DIAGNOSIS — Z79899 Other long term (current) drug therapy: Secondary | ICD-10-CM | POA: Diagnosis not present

## 2015-04-21 DIAGNOSIS — I10 Essential (primary) hypertension: Secondary | ICD-10-CM | POA: Diagnosis not present

## 2015-04-21 DIAGNOSIS — K3184 Gastroparesis: Secondary | ICD-10-CM | POA: Diagnosis not present

## 2015-04-21 LAB — COMPREHENSIVE METABOLIC PANEL
ALT: 13 U/L — ABNORMAL LOW (ref 17–63)
AST: 16 U/L (ref 15–41)
Albumin: 3.4 g/dL — ABNORMAL LOW (ref 3.5–5.0)
Alkaline Phosphatase: 45 U/L (ref 38–126)
Anion gap: 7 (ref 5–15)
BUN: 8 mg/dL (ref 6–20)
CO2: 25 mmol/L (ref 22–32)
Calcium: 8.7 mg/dL — ABNORMAL LOW (ref 8.9–10.3)
Chloride: 106 mmol/L (ref 101–111)
Creatinine, Ser: 0.89 mg/dL (ref 0.61–1.24)
GFR calc Af Amer: >60 mL/min (ref >60)
GFR calc non Af Amer: >60 mL/min (ref >60)
Glucose, Bld: 207 mg/dL — ABNORMAL HIGH (ref 65–99)
Potassium: 3.5 mmol/L (ref 3.5–5.1)
Sodium: 138 mmol/L (ref 135–145)
Total Bilirubin: 0.8 mg/dL (ref 0.3–1.2)
Total Protein: 6.7 g/dL (ref 6.5–8.1)

## 2015-04-21 LAB — CBC WITH DIFFERENTIAL/PLATELET
Basophils Absolute: 0 10*3/uL (ref 0.0–0.1)
Basophils Relative: 0 % (ref 0–1)
Eosinophils Absolute: 0.1 10*3/uL (ref 0.0–0.7)
Eosinophils Relative: 1 % (ref 0–5)
HCT: 36.4 % — ABNORMAL LOW (ref 39.0–52.0)
Hemoglobin: 12.8 g/dL — ABNORMAL LOW (ref 13.0–17.0)
Lymphocytes Relative: 17 % (ref 12–46)
Lymphs Abs: 1.3 10*3/uL (ref 0.7–4.0)
MCH: 31.2 pg (ref 26.0–34.0)
MCHC: 35.2 g/dL (ref 30.0–36.0)
MCV: 88.8 fL (ref 78.0–100.0)
Monocytes Absolute: 0.6 10*3/uL (ref 0.1–1.0)
Monocytes Relative: 7 % (ref 3–12)
Neutro Abs: 5.8 10*3/uL (ref 1.7–7.7)
Neutrophils Relative %: 75 % (ref 43–77)
Platelets: 154 10*3/uL (ref 150–400)
RBC: 4.1 MIL/uL — ABNORMAL LOW (ref 4.22–5.81)
RDW: 12.6 % (ref 11.5–15.5)
WBC: 7.7 10*3/uL (ref 4.0–10.5)

## 2015-04-21 LAB — LIPASE, BLOOD: Lipase: 22 U/L (ref 22–51)

## 2015-04-21 MED ORDER — SODIUM CHLORIDE 0.9 % IV SOLN
Freq: Once | INTRAVENOUS | Status: AC
  Administered 2015-04-21: 999 mL via INTRAVENOUS

## 2015-04-21 MED ORDER — METOCLOPRAMIDE HCL 5 MG/ML IJ SOLN
5.0000 mg | Freq: Once | INTRAMUSCULAR | Status: AC
  Administered 2015-04-21: 5 mg via INTRAVENOUS
  Filled 2015-04-21: qty 2

## 2015-04-21 MED ORDER — FENTANYL CITRATE (PF) 100 MCG/2ML IJ SOLN
100.0000 ug | Freq: Once | INTRAMUSCULAR | Status: AC
  Administered 2015-04-21: 100 ug via INTRAVENOUS
  Filled 2015-04-21: qty 2

## 2015-04-21 MED ORDER — PANTOPRAZOLE SODIUM 40 MG IV SOLR
40.0000 mg | Freq: Once | INTRAVENOUS | Status: AC
  Administered 2015-04-21: 40 mg via INTRAVENOUS
  Filled 2015-04-21: qty 40

## 2015-04-21 MED ORDER — MORPHINE SULFATE (PF) 2 MG/ML IV SOLN
4.0000 mg | Freq: Once | INTRAVENOUS | Status: AC
  Administered 2015-04-21: 4 mg via INTRAVENOUS
  Filled 2015-04-21: qty 2

## 2015-04-21 NOTE — Discharge Instructions (Signed)
Gastroparesis  Follow-up with your primary care physician or your gastroenterologist at [facility]. Return for fever, inability to tolerate fluids, increased abdominal pain, or diarrhea. Gastroparesis is also called slowed stomach emptying (delayed gastric emptying). It is a condition in which the stomach takes too long to empty its contents. It often happens in people with diabetes.  CAUSES  Gastroparesis happens when nerves to the stomach are damaged or stop working. When the nerves are damaged, the muscles of the stomach and intestines do not work normally. The movement of food is slowed or stopped. High blood glucose (sugar) causes changes in nerves and can damage the blood vessels that carry oxygen and nutrients to the nerves. RISK FACTORS  Diabetes.  Post-viral syndromes.  Eating disorders (anorexia, bulimia).  Surgery on the stomach or vagus nerve.  Gastroesophageal reflux disease (rarely).  Smooth muscle disorders (amyloidosis, scleroderma).  Metabolic disorders, including hypothyroidism.  Parkinson disease. SYMPTOMS   Heartburn.  Feeling sick to your stomach (nausea).  Vomiting of undigested food.  An early feeling of fullness when eating.  Weight loss.  Abdominal bloating.  Erratic blood glucose levels.  Lack of appetite.  Gastroesophageal reflux.  Spasms of the stomach wall. Complications can include:  Bacterial overgrowth in stomach. Food stays in the stomach and can ferment and cause bacteria to grow.  Weight loss due to difficulty digesting and absorbing nutrients.  Vomiting.  Obstruction in the stomach. Undigested food can harden and cause nausea and vomiting.  Blood glucose fluctuations caused by inconsistent food absorption. DIAGNOSIS  The diagnosis of gastroparesis is confirmed through one or more of the following tests:  Barium X-rays and scans. These tests look at how long it takes for food to move through the stomach.  Gastric manometry.  This test measures electrical and muscular activity in the stomach. A thin tube is passed down the throat into the stomach. The tube contains a wire that takes measurements of the stomach's electrical and muscular activity as it digests liquids and solid food.  Endoscopy. This procedure is done with a long, thin tube called an endoscope. It is passed through the mouth and gently down the esophagus into the stomach. This tube helps the caregiver look at the lining of the stomach to check for any abnormalities.  Ultrasonography. This can rule out gallbladder disease or pancreatitis. This test will outline and define the shape of the gallbladder and pancreas. TREATMENT   Treatments may include:  Exercise.  Medicines to control nausea and vomiting.  Medicines to stimulate stomach muscles.  Changes in what and when you eat.  Having smaller meals more often.  Eating low-fiber forms of high-fiber foods, such as eating cooked vegetables instead of raw vegetables.  Eating low-fat foods.  Consuming liquids, which are easier to digest.  In severe cases, feeding tubes and intravenous (IV) feeding may be needed. It is important to note that in most cases, treatment does not cure gastroparesis. It is usually a lasting (chronic) condition. Treatment helps you manage the underlying condition so that you can be as healthy and comfortable as possible. Other treatments  A gastric neurostimulator has been developed to assist people with gastroparesis. The battery-operated device is surgically implanted. It emits mild electrical pulses to help improve stomach emptying and to control nausea and vomiting.  The use of botulinum toxin has been shown to improve stomach emptying by decreasing the prolonged contractions of the muscle between the stomach and the small intestine (pyloric sphincter). The benefits are temporary. [place]  IF:   You have diabetes and you are having problems keeping your  blood glucose in goal range.  You are having nausea, vomiting, bloating, or early feelings of fullness with eating.  Your symptoms do not change with a change in diet. Document Released: 07/28/2005 Document Revised: 11/22/2012 Document Reviewed: 01/04/2009 [facility] Patient Information 2015 [place], [place]. This information is not intended to replace advice given to you by your health care provider. Make sure you discuss any questions you have with your health care provider.

## 2015-04-21 NOTE — ED Provider Notes (Signed)
CSN: [id number]     Arrival date & time 04/21/15  0820 History   First MD Initiated Contact with Patient 04/21/15 [phone]     Chief Complaint  Patient presents with  . Abdominal Pain     (Consider location/radiation/quality/duration/timing/severity/associated sxs/prior Treatment) Patient is a 44 y.o. male presenting with abdominal pain. The history is provided by the patient. No language interpreter was used.  Abdominal Pain Associated symptoms: nausea and vomiting   Associated symptoms: no fever   Mr. Hewey is a 44 y.o male with a history DM type 2, gastritis, GERD, esophagitis, h.pylori, and htn who presents for epigastric abdominal pain, nausea, and vomiting that began early this morning. He states that it feels like his usual gastroparesis episodes. He states that he took Carafate, Protonix, and Reglan with minimal relief. He states that he is due for a stomach emptying test for his gastroparesis in October. He denies any fever, chills, chest pain, shortness of breath, hematemesis, hematochezia, diarrhea, constipation, urinary symptoms.  Past Medical History  Diagnosis Date  . Type 2 diabetes mellitus with complications     diagnosed around age 22  . Glaucoma   . Eye hemorrhage   . Gastritis   . GERD (gastroesophageal reflux disease)   . Esophagitis   . Hiccups   . Helicobacter pylori (H. pylori) infection   . Hypertension    Past Surgical History  Procedure Laterality Date  . Eye surgery    . Esophagogastroduodenoscopy  2015    Dr. Britta Mccreedy  . Esophagogastroduodenoscopy N/A 10/26/2014    RMR: Distal esophagititis-likely reflux related although an element of pill induced injuruy no exclueded  status post biopsy. Diffusely abnormal gastric mucosa of uncertain signigicane -status post gastric biopsy. Focal area of excoriation in the cardia most consistant with a trauma of heaving.   . Eye surgery     Family History  Problem Relation Age of Onset  . Ovarian cancer Mother   .  Cervical cancer Sister   . Diabetes Sister   . Colon cancer Neg Hx    Social History  Substance Use Topics  . Smoking status: Never Smoker   . Smokeless tobacco: Never Used  . Alcohol Use: No    Review of Systems  Constitutional: Negative for fever.  Gastrointestinal: Positive for nausea, vomiting and abdominal pain.  All other systems reviewed and are negative.     Allergies  Fish allergy; Aspirin; Bactrim; Doxycycline; Penicillins; Phenergan; and Zofran  Home Medications   Prior to Admission medications   Medication Sig Start Date End Date Taking? Authorizing Provider  brimonidine-timolol (COMBIGAN) 0.2-0.5 % ophthalmic solution Place 1 drop into both eyes at bedtime.   Yes Historical Provider, MD  gabapentin (NEURONTIN) 600 MG tablet Take 600 mg by mouth daily as needed (for neuropathy).    Yes Historical Provider, MD  hydroxypropyl methylcellulose / hypromellose (ISOPTO TEARS / GONIOVISC) 2.5 % ophthalmic solution Place 1 drop into both eyes as needed for dry eyes.   Yes Historical Provider, MD  insulin aspart protamine- aspart (NOVOLOG MIX 70/30) (70-30) 100 UNIT/ML injection Inject 0.15 mLs (15 Units total) into the skin 3 (three) times daily. Patient taking differently: Inject 35 Units into the skin 3 (three) times daily.  02/20/15  Yes Orvan Falconer, MD  insulin detemir (LEVEMIR) 100 UNIT/ML injection Inject 30 Units into the skin at bedtime.   Yes Historical Provider, MD  lisinopril (PRINIVIL,ZESTRIL) 20 MG tablet Take 20 mg by mouth daily.   Yes Historical Provider, MD  metoCLOPramide (REGLAN) 10 MG tablet Take 1 tablet (10 mg total) by mouth 4 (four) times daily. 03/19/15  Yes Tanna Furry, MD  oxyCODONE-acetaminophen (PERCOCET/ROXICET) 5-325 MG per tablet Take 1-2 tablets by mouth every 6 (six) hours as needed. 04/19/15  Yes [name], MD  pantoprazole (PROTONIX) 40 MG tablet Take 1 tablet (40 mg total) by mouth 2 (two) times daily before a meal. 01/31/15  Yes Carlis Stable, NP   sucralfate (CARAFATE) 1 G tablet Take 1 tablet (1 g total) by mouth 4 (four) times daily. 04/20/15  Yes Mahala Menghini, PA-C   BP 164/108 mmHg  Pulse 85  Temp(Src) 97.4 F (36.3 C) (Oral)  Resp 20  Ht 6' (1.829 m)  Wt 265 lb (120.203 kg)  BMI 35.93 kg/m2  SpO2 100% Physical Exam  Constitutional: He is oriented to person, place, and time. He appears well-developed and well-nourished. No distress.  He is moaning in pain.   HENT:  Head: Normocephalic and atraumatic.  Eyes: Conjunctivae are normal.  Neck: Normal range of motion. Neck supple.  Cardiovascular: Normal rate, regular rhythm and normal heart sounds.   Pulmonary/Chest: Effort normal and breath sounds normal. No accessory muscle usage. No respiratory distress. He has no decreased breath sounds. He has no wheezes. He has no rales.  Lungs clear to auscultation bilaterally.  Abdominal: Soft. He exhibits no distension. There is tenderness in the epigastric area. There is no rebound and no guarding.  Epigastric abdominal tenderness to palpation. No guarding or rebound. No distention.  Musculoskeletal: Normal range of motion.  Neurological: He is alert and oriented to person, place, and time.  Skin: Skin is warm and dry. He is not diaphoretic.  Nursing note and vitals reviewed.   ED Course  Procedures (including critical care time) Labs Review Labs Reviewed  CBC WITH DIFFERENTIAL/PLATELET - Abnormal; Notable for the following:    RBC 4.10 (*)    Hemoglobin 12.8 (*)    HCT 36.4 (*)    All other components within normal limits  COMPREHENSIVE METABOLIC PANEL - Abnormal; Notable for the following:    Glucose, Bld 207 (*)    Calcium 8.7 (*)    Albumin 3.4 (*)    ALT 13 (*)    All other components within normal limits  LIPASE, BLOOD    Imaging Review No results found. I have personally reviewed and evaluated these images and lab results as part of my medical decision-making.   EKG Interpretation None      MDM    Final diagnoses:  Gastroparesis   Patient presents for abdominal pain, nausea, and vomiting that began this morning. He was seen 2 days ago for the same and is here often for gastroparesis episodes. He was seen by GI at [facility] one month ago for gastroparesis. He has been noncompliant with diabetic medication. He is well-appearing and vitals are stable. Medications  0.9 %  sodium chloride infusion (999 mLs Intravenous New Bag/Given 04/21/15 0855)  metoCLOPramide (REGLAN) injection 5 mg (5 mg Intravenous Given 04/21/15 0854)  fentaNYL (SUBLIMAZE) injection 100 mcg (100 mcg Intravenous Given 04/21/15 0856)  pantoprazole (PROTONIX) injection 40 mg (40 mg Intravenous Given 04/21/15 0856)  morphine 2 MG/ML injection 4 mg (4 mg Intravenous Given 04/21/15 0955)  Recheck: Patient states he is feeling much better after fluids and pain medication. He is slightly hyperglycemic but otherwise labs are not concerning. I discussed return precautions as well as follow-up with his physician at [facility]. Patient verbally agrees with the  plan. I do not believe his pain is caused by pancreatitis, cholecystitis, small bowel obstruction, diverticulitis, or appendicitis.     Ottie Glazier, PA-C 04/21/15 1054  Virgel Manifold, MD 04/23/15 0800

## 2015-04-21 NOTE — ED Notes (Signed)
Patient discharged but waiting in room for ride.

## 2015-04-21 NOTE — ED Notes (Signed)
Pt was here Thursday, dx with gastroparesis, state pain is getting worse, vomiting at the bedside

## 2015-05-30 ENCOUNTER — Emergency Department (HOSPITAL_COMMUNITY)
Admission: EM | Admit: 2015-05-30 | Discharge: 2015-05-30 | Disposition: A | Discharge status: Home / Self Care | Payer: Medicaid Other | Attending: Emergency Medicine | Admitting: Emergency Medicine

## 2015-05-30 ENCOUNTER — Encounter (HOSPITAL_COMMUNITY): Payer: Self-pay | Admitting: Emergency Medicine

## 2015-05-30 DIAGNOSIS — Z880 Allergy status to penicillin: Secondary | ICD-10-CM | POA: Insufficient documentation

## 2015-05-30 DIAGNOSIS — J3489 Other specified disorders of nose and nasal sinuses: Secondary | ICD-10-CM | POA: Diagnosis not present

## 2015-05-30 DIAGNOSIS — E119 Type 2 diabetes mellitus without complications: Secondary | ICD-10-CM | POA: Insufficient documentation

## 2015-05-30 DIAGNOSIS — R0981 Nasal congestion: Secondary | ICD-10-CM | POA: Insufficient documentation

## 2015-05-30 DIAGNOSIS — Z79899 Other long term (current) drug therapy: Secondary | ICD-10-CM | POA: Insufficient documentation

## 2015-05-30 DIAGNOSIS — R197 Diarrhea, unspecified: Secondary | ICD-10-CM | POA: Diagnosis not present

## 2015-05-30 DIAGNOSIS — Z8619 Personal history of other infectious and parasitic diseases: Secondary | ICD-10-CM | POA: Insufficient documentation

## 2015-05-30 DIAGNOSIS — H5442 Blindness, left eye, normal vision right eye: Secondary | ICD-10-CM | POA: Insufficient documentation

## 2015-05-30 DIAGNOSIS — K219 Gastro-esophageal reflux disease without esophagitis: Secondary | ICD-10-CM | POA: Diagnosis not present

## 2015-05-30 DIAGNOSIS — K3184 Gastroparesis: Secondary | ICD-10-CM | POA: Diagnosis not present

## 2015-05-30 DIAGNOSIS — Z794 Long term (current) use of insulin: Secondary | ICD-10-CM | POA: Insufficient documentation

## 2015-05-30 DIAGNOSIS — R1013 Epigastric pain: Secondary | ICD-10-CM | POA: Diagnosis present

## 2015-05-30 DIAGNOSIS — I10 Essential (primary) hypertension: Secondary | ICD-10-CM | POA: Insufficient documentation

## 2015-05-30 DIAGNOSIS — R600 Localized edema: Secondary | ICD-10-CM | POA: Diagnosis not present

## 2015-05-30 DIAGNOSIS — H409 Unspecified glaucoma: Secondary | ICD-10-CM | POA: Diagnosis not present

## 2015-05-30 LAB — CBC WITH DIFFERENTIAL/PLATELET
Basophils Absolute: 0 10*3/uL (ref 0.0–0.1)
Basophils Relative: 0 %
Eosinophils Absolute: 0.1 10*3/uL (ref 0.0–0.7)
Eosinophils Relative: 1 %
HCT: 39.6 % (ref 39.0–52.0)
Hemoglobin: 14.1 g/dL (ref 13.0–17.0)
Lymphocytes Relative: 22 %
Lymphs Abs: 1.7 10*3/uL (ref 0.7–4.0)
MCH: 31.7 pg (ref 26.0–34.0)
MCHC: 35.6 g/dL (ref 30.0–36.0)
MCV: 89 fL (ref 78.0–100.0)
Monocytes Absolute: 0.5 10*3/uL (ref 0.1–1.0)
Monocytes Relative: 6 %
Neutro Abs: 5.7 10*3/uL (ref 1.7–7.7)
Neutrophils Relative %: 71 %
Platelets: 125 10*3/uL — ABNORMAL LOW (ref 150–400)
RBC: 4.45 MIL/uL (ref 4.22–5.81)
RDW: 12.5 % (ref 11.5–15.5)
WBC: 8 10*3/uL (ref 4.0–10.5)

## 2015-05-30 LAB — COMPREHENSIVE METABOLIC PANEL
ALT: 36 U/L (ref 17–63)
AST: 25 U/L (ref 15–41)
Albumin: 3.7 g/dL (ref 3.5–5.0)
Alkaline Phosphatase: 57 U/L (ref 38–126)
Anion gap: 7 (ref 5–15)
BUN: 16 mg/dL (ref 6–20)
CO2: 26 mmol/L (ref 22–32)
Calcium: 9.2 mg/dL (ref 8.9–10.3)
Chloride: 103 mmol/L (ref 101–111)
Creatinine, Ser: 1.07 mg/dL (ref 0.61–1.24)
GFR calc Af Amer: >60 mL/min (ref >60)
GFR calc non Af Amer: >60 mL/min (ref >60)
Glucose, Bld: 205 mg/dL — ABNORMAL HIGH (ref 65–99)
Potassium: 4 mmol/L (ref 3.5–5.1)
Sodium: 136 mmol/L (ref 135–145)
Total Bilirubin: 0.7 mg/dL (ref 0.3–1.2)
Total Protein: 7.3 g/dL (ref 6.5–8.1)

## 2015-05-30 LAB — LIPASE, BLOOD: Lipase: 28 U/L (ref 22–51)

## 2015-05-30 MED ORDER — HYDROMORPHONE HCL 1 MG/ML IJ SOLN
1.0000 mg | Freq: Once | INTRAMUSCULAR | Status: AC
  Administered 2015-05-30: 1 mg via INTRAVENOUS
  Filled 2015-05-30: qty 1

## 2015-05-30 MED ORDER — METOCLOPRAMIDE HCL 5 MG/ML IJ SOLN
5.0000 mg | Freq: Once | INTRAMUSCULAR | Status: AC
  Administered 2015-05-30: 5 mg via INTRAVENOUS
  Filled 2015-05-30: qty 2

## 2015-05-30 MED ORDER — SODIUM CHLORIDE 0.9 % IV BOLUS (SEPSIS)
500.0000 mL | Freq: Once | INTRAVENOUS | Status: AC
  Administered 2015-05-30: 500 mL via INTRAVENOUS

## 2015-05-30 MED ORDER — SODIUM CHLORIDE 0.9 % IV SOLN
INTRAVENOUS | Status: DC
  Administered 2015-05-30: 14:00:00 via INTRAVENOUS

## 2015-05-30 MED ORDER — HYDROCODONE-ACETAMINOPHEN 5-325 MG PO TABS: 1.0000 | ORAL_TABLET | Freq: Four times a day (QID) | ORAL | Status: DC | PRN

## 2015-05-30 MED ORDER — PANTOPRAZOLE SODIUM 40 MG IV SOLR
40.0000 mg | Freq: Once | INTRAVENOUS | Status: AC
  Administered 2015-05-30: 40 mg via INTRAVENOUS
  Filled 2015-05-30: qty 40

## 2015-05-30 NOTE — ED Notes (Signed)
Pt c/o stabbing epigastric pain that started yesterday. Pt reports he has gastroparesis and the pain feels similar except he is also having diarrhea with his abdominal pain this time. Pt reports he was put on antibiotics last week for his foot, which he has now completed, and wasn't sure if this pain had anything to do with the medication he was taking.

## 2015-05-30 NOTE — Discharge Instructions (Signed)
Gastroparesis °Gastroparesis, also called delayed gastric emptying, is a condition in which food takes longer than normal to empty from the stomach. The condition is usually long-lasting (chronic). °CAUSES °This condition may be caused by: °· An endocrine disorder, such as hypothyroidism or diabetes. Diabetes is the most common cause of this condition. °· A nervous system disease, such as Parkinson disease or multiple sclerosis. °· Cancer, infection, or surgery of the stomach or vagus nerve. °· A connective tissue disorder, such as scleroderma. °· Certain medicines. °In most cases, the cause is not known. °RISK FACTORS °This condition is more likely to develop in: °· People with certain disorders, including endocrine disorders, eating disorders, amyloidosis, and scleroderma. °· People with certain diseases, including Parkinson disease or multiple sclerosis. °· People with cancer or infection of the stomach or vagus nerve. °· People who have had surgery on the stomach or vagus nerve. °· People who take certain medicines. °· Women. °SYMPTOMS °Symptoms of this condition include: °· An early feeling of fullness when eating. °· Nausea. °· Weight loss. °· Vomiting. °· Heartburn. °· Abdominal bloating. °· Inconsistent blood glucose levels. °· Lack of appetite. °· Acid from the stomach coming up into the esophagus (gastroesophageal reflux). °· Spasms of the stomach. °Symptoms may come and go. °DIAGNOSIS °This condition is diagnosed with tests, such as: °· Tests that check how long it takes food to move through the stomach and intestines. These tests include: °¨ Upper gastrointestinal (GI) series. In this test, X-rays of the intestines are taken after you drink a liquid. The liquid makes the intestines show up better on the X-rays. °¨ Gastric emptying scintigraphy. In this test, scans are taken after you eat food that contains a small amount of radioactive material. °¨ Wireless capsule GI monitoring system. This test  involves swallowing a capsule that records information about movement through the stomach. °· Gastric manometry. This test measures electrical and muscular activity in the stomach. It is done with a thin tube that is passed down the throat and into the stomach. °· Endoscopy. This test checks for abnormalities in the lining of the stomach. It is done with a long, thin tube that is passed down the throat and into the stomach. °· An ultrasound. This test can help rule out gallbladder disease or pancreatitis as a cause of your symptoms. It uses sound waves to take pictures of the inside of your body. °TREATMENT °There is no cure for gastroparesis. This condition may be managed with: °· Treatment of the underlying condition causing the gastroparesis. °· Lifestyle changes, including exercise and dietary changes. Dietary changes can include: °¨ Changes in what and when you eat. °¨ Eating smaller meals more often. °¨ Eating low-fat foods. °¨ Eating low-fiber forms of high-fiber foods, such as cooked vegetables instead of raw vegetables. °¨ Having liquid foods in place of solid foods. Liquid foods are easier to digest. °· Medicines. These may be given to control nausea and vomiting and to stimulate stomach muscles. °· Getting food through a feeding tube. This may be done in severe cases. °· A gastric neurostimulator. This is a device that is inserted into the body with surgery. It helps improve stomach emptying and control nausea and vomiting. °HOME CARE INSTRUCTIONS °· Follow your health care provider's instructions about exercise and diet. °· Take medicines only as directed by your health care provider. °SEEK MEDICAL CARE IF: °· Your symptoms do not improve with treatment. °· You have new symptoms. °SEEK IMMEDIATE MEDICAL CARE IF: °· You have   severe abdominal pain that does not improve with treatment.  You have nausea that does not go away.  You cannot keep fluids down.   This information is not intended to replace  advice given to you by your health care provider. Make sure you discuss any questions you have with your health care provider.    Continue your current medications. Make an appoint with follow-up with GI doctor. Take hydrocodone as needed.   Document Released: 07/28/2005 Document Revised: 12/12/2014 Document Reviewed: 07/24/2014 Elsevier Interactive Patient Education [employer].

## 2015-05-30 NOTE — ED Notes (Signed)
Pt states that he started with abdominal pain yesterday - states that he has hx of gastroparesis , however he is having dairrehea with this -- vomited x1

## 2015-05-30 NOTE — ED Notes (Signed)
MD at bedside. 

## 2015-05-30 NOTE — ED Provider Notes (Signed)
CSN: [id number]     Arrival date & time 05/30/15  1136 History  By signing my name below, I, Stephania Fragmin, attest that this documentation has been prepared under the direction and in the presence of Fredia Sorrow, MD. Electronically Signed: Stephania Fragmin, ED Scribe. 05/30/2015. 1:00 PM.   Chief Complaint  Patient presents with  . Abdominal Pain   Patient is a 44 y.o. male presenting with abdominal pain. The history is provided by the patient. No language interpreter was used.  Abdominal Pain Pain location:  Epigastric Pain quality: stabbing   Pain radiates to:  Epigastric region Pain severity:  Severe Onset quality:  Gradual Duration:  12 hours Timing:  Constant Progression:  Worsening Chronicity:  Recurrent Relieved by:  Nothing Worsened by:  Nothing tried Ineffective treatments: Carafate. Associated symptoms: diarrhea, nausea and vomiting   Associated symptoms: no chest pain, no chills, no cough, no dysuria, no fever, no hematuria and no sore throat    HPI Comments: Timothy Clark is a 44 y.o. male with a history of gastroparesis, gastritis, esophagitis, NIDDM, and left eye blindness, who presents to the Emergency Department complaining of 10/10 stabbing epigastric pain that began yesterday evening. He reports his pain makes it difficult to breathe. He states he has a history of gastroparesis and notes that this feels similar. He also notes one episode of vomiting, 4-5 episodes of diarrhea, rhinorrhea, and nasal congestion. He reports he had completed a 3-day course of Levaquin yesterday for an infection in his foot. Patient normally takes Carafate with relief, but it has not been working for this episode. When he has come to the ED in the past for gastroparesis, he is normally treated with Dilaudid, which resolves his pain. He denies hematemesis, hematochezia, fevers, chills, visual changes, cough, sore throat, chest pain, SOB, dysuria, hematuria, leg swelling, bleeding easily, back  pain, rash, or headache.  PCP: Jeri Modena at [facility] in [place]  GI: Dr. Gala Romney   Past Medical History  Diagnosis Date  . Type 2 diabetes mellitus with complications ([facility])     diagnosed around age 47  . Glaucoma   . Eye hemorrhage   . Gastritis   . GERD (gastroesophageal reflux disease)   . Esophagitis   . Hiccups   . Helicobacter pylori (H. pylori) infection   . Hypertension    Past Surgical History  Procedure Laterality Date  . Eye surgery    . Esophagogastroduodenoscopy  2015    Dr. Britta Mccreedy  . Esophagogastroduodenoscopy N/A 10/26/2014    RMR: Distal esophagititis-likely reflux related although an element of pill induced injuruy no exclueded  status post biopsy. Diffusely abnormal gastric mucosa of uncertain signigicane -status post gastric biopsy. Focal area of excoriation in the cardia most consistant with a trauma of heaving.   . Eye surgery     Family History  Problem Relation Age of Onset  . Ovarian cancer Mother   . Cervical cancer Sister   . Diabetes Sister   . Colon cancer Neg Hx    Social History  Substance Use Topics  . Smoking status: Never Smoker   . Smokeless tobacco: Never Used  . Alcohol Use: No    Review of Systems  Constitutional: Negative for fever and chills.  HENT: Positive for congestion and rhinorrhea. Negative for sore throat.   Eyes: Negative for visual disturbance.  Respiratory: Negative for cough.   Cardiovascular: Negative for chest pain and leg swelling.  Gastrointestinal: Positive for nausea, vomiting, abdominal pain and  diarrhea.  Genitourinary: Negative for dysuria and hematuria.  Musculoskeletal: Negative for back pain.  Skin: Negative for rash.  Neurological: Negative for headaches.  Hematological: Does not bruise/bleed easily.  All other systems reviewed and are negative.     Allergies  Fish allergy; Aspirin; Bactrim; Doxycycline; Penicillins; Phenergan; and Zofran  Home Medications   Prior to  Admission medications   Medication Sig Start Date End Date Taking? Authorizing Provider  brimonidine-timolol (COMBIGAN) 0.2-0.5 % ophthalmic solution Place 1 drop into both eyes at bedtime.   Yes Historical Provider, MD  gabapentin (NEURONTIN) 600 MG tablet Take 600 mg by mouth daily as needed (for neuropathy).    Yes Historical Provider, MD  hydroxypropyl methylcellulose / hypromellose (ISOPTO TEARS / GONIOVISC) 2.5 % ophthalmic solution Place 1 drop into both eyes as needed for dry eyes.   Yes Historical Provider, MD  insulin aspart protamine- aspart (NOVOLOG MIX 70/30) (70-30) 100 UNIT/ML injection Inject 0.15 mLs (15 Units total) into the skin 3 (three) times daily. Patient taking differently: Inject 35 Units into the skin 3 (three) times daily.  02/20/15  Yes Orvan Falconer, MD  insulin detemir (LEVEMIR) 100 UNIT/ML injection Inject 30 Units into the skin at bedtime.   Yes Historical Provider, MD  lisinopril-hydrochlorothiazide (PRINZIDE,ZESTORETIC) 20-25 MG tablet Take 1 tablet by mouth daily.   Yes Historical Provider, MD  metoCLOPramide (REGLAN) 10 MG tablet Take 1 tablet (10 mg total) by mouth 4 (four) times daily. 03/19/15  Yes Tanna Furry, MD  pantoprazole (PROTONIX) 40 MG tablet Take 1 tablet (40 mg total) by mouth 2 (two) times daily before a meal. 01/31/15  Yes Carlis Stable, NP  sucralfate (CARAFATE) 1 G tablet Take 1 tablet (1 g total) by mouth 4 (four) times daily. 04/20/15  Yes Mahala Menghini, PA-C  HYDROcodone-acetaminophen (NORCO/VICODIN) 5-325 MG tablet Take 1-2 tablets by mouth every 6 (six) hours as needed. 05/30/15   Fredia Sorrow, MD   BP 158/95 mmHg  Pulse 65  Temp(Src) 97.7 F (36.5 C) (Oral)  Resp 13  Ht 6' (1.829 m)  Wt 267 lb (121.11 kg)  BMI 36.20 kg/m2  SpO2 100% Physical Exam  Constitutional: He is oriented to person, place, and time. He appears well-developed and well-nourished. No distress.  HENT:  Head: Normocephalic and atraumatic.  Mouth/Throat: Oropharynx is clear  and moist.  Mucous membranes are moist.  Eyes: Conjunctivae and EOM are normal. Pupils are equal, round, and reactive to light. No scleral icterus.  PERRL. Pupils appear normal. Sclera is clear. Eyes track normal.  Neck: Neck supple. No tracheal deviation present.  Cardiovascular: Normal rate and regular rhythm.   No murmur heard. Pulmonary/Chest: Effort normal and breath sounds normal. No respiratory distress. He has no wheezes. He has no rales.  Lungs are clear to auscultation.  Abdominal: Bowel sounds are normal. He exhibits no distension. There is tenderness.  Abdomen is firm but non-distended. Epigastric tenderness.  Musculoskeletal: Normal range of motion. He exhibits edema.  Trace pitting edema in both ankles.  Neurological: He is alert and oriented to person, place, and time.  Skin: Skin is warm and dry.  Psychiatric: He has a normal mood and affect. His behavior is normal.  Nursing note and vitals reviewed.   ED Course  Procedures (including critical care time)  DIAGNOSTIC STUDIES: Oxygen Saturation is 100% on RA, normal by my interpretation.    COORDINATION OF CARE: 12:56 PM - Discussed treatment plan with pt at bedside which includes labs and symptom-relieving medication.  Pt verbalized understanding and agreed to plan.   Labs Review Labs Reviewed  CBC WITH DIFFERENTIAL/PLATELET - Abnormal; Notable for the following:    Platelets 125 (*)    All other components within normal limits  COMPREHENSIVE METABOLIC PANEL - Abnormal; Notable for the following:    Glucose, Bld 205 (*)    All other components within normal limits  LIPASE, BLOOD   Results for orders placed or performed during the hospital encounter of 05/30/15  CBC with Differential/Platelet  Result Value Ref Range   WBC 8.0 4.0 - 10.5 K/uL   RBC 4.45 4.22 - 5.81 MIL/uL   Hemoglobin 14.1 13.0 - 17.0 g/dL   HCT 39.6 39.0 - 52.0 %   MCV 89.0 78.0 - 100.0 fL   MCH 31.7 26.0 - 34.0 pg   MCHC 35.6 30.0 - 36.0  g/dL   RDW 12.5 11.5 - 15.5 %   Platelets 125 (L) 150 - 400 K/uL   Neutrophils Relative % 71 %   Neutro Abs 5.7 1.7 - 7.7 K/uL   Lymphocytes Relative 22 %   Lymphs Abs 1.7 0.7 - 4.0 K/uL   Monocytes Relative 6 %   Monocytes Absolute 0.5 0.1 - 1.0 K/uL   Eosinophils Relative 1 %   Eosinophils Absolute 0.1 0.0 - 0.7 K/uL   Basophils Relative 0 %   Basophils Absolute 0.0 0.0 - 0.1 K/uL  Comprehensive metabolic panel  Result Value Ref Range   Sodium 136 135 - 145 mmol/L   Potassium 4.0 3.5 - 5.1 mmol/L   Chloride 103 101 - 111 mmol/L   CO2 26 22 - 32 mmol/L   Glucose, Bld 205 (H) 65 - 99 mg/dL   BUN 16 6 - 20 mg/dL   Creatinine, Ser 1.07 0.61 - 1.24 mg/dL   Calcium 9.2 8.9 - 10.3 mg/dL   Total Protein 7.3 6.5 - 8.1 g/dL   Albumin 3.7 3.5 - 5.0 g/dL   AST 25 15 - 41 U/L   ALT 36 17 - 63 U/L   Alkaline Phosphatase 57 38 - 126 U/L   Total Bilirubin 0.7 0.3 - 1.2 mg/dL   GFR calc non Af Amer >60 >60 mL/min   GFR calc Af Amer >60 >60 mL/min   Anion gap 7 5 - 15  Lipase, blood  Result Value Ref Range   Lipase 28 22 - 51 U/L     MDM   Final diagnoses:  Gastroparesis    Patient with significant improvement here with medication. Lab workup without significant abnormalities. No leukocytosis no anemia no significant electrolyte abnormalities. Symptoms consistent with his past gastroparesis. Patient treated here with Reglan Protonix and Demerol with significant improvement     I, Anguel Delapena, personally performed the services described in this documentation. All medical record entries made by the scribe were at my direction and in my presence.  I have reviewed the chart and discharge instructions and agree that the record reflects my personal performance and is accurate and complete. Merville Hijazi.  05/30/2015. 3:15 PM.      Fredia Sorrow, MD 05/30/15 [phone]

## 2015-06-01 ENCOUNTER — Ambulatory Visit: Payer: Medicaid Other | Admitting: Nutrition

## 2015-06-07 ENCOUNTER — Emergency Department (HOSPITAL_COMMUNITY)
Admission: EM | Admit: 2015-06-07 | Discharge: 2015-06-07 | Disposition: A | Discharge status: Home / Self Care | Payer: Medicaid Other | Attending: Emergency Medicine | Admitting: Emergency Medicine

## 2015-06-07 ENCOUNTER — Encounter (HOSPITAL_COMMUNITY): Payer: Self-pay | Admitting: Emergency Medicine

## 2015-06-07 DIAGNOSIS — Z79899 Other long term (current) drug therapy: Secondary | ICD-10-CM | POA: Diagnosis not present

## 2015-06-07 DIAGNOSIS — E119 Type 2 diabetes mellitus without complications: Secondary | ICD-10-CM | POA: Insufficient documentation

## 2015-06-07 DIAGNOSIS — Z880 Allergy status to penicillin: Secondary | ICD-10-CM | POA: Insufficient documentation

## 2015-06-07 DIAGNOSIS — K219 Gastro-esophageal reflux disease without esophagitis: Secondary | ICD-10-CM | POA: Insufficient documentation

## 2015-06-07 DIAGNOSIS — R1013 Epigastric pain: Secondary | ICD-10-CM | POA: Diagnosis present

## 2015-06-07 DIAGNOSIS — K3184 Gastroparesis: Secondary | ICD-10-CM | POA: Diagnosis not present

## 2015-06-07 DIAGNOSIS — Z8619 Personal history of other infectious and parasitic diseases: Secondary | ICD-10-CM | POA: Diagnosis not present

## 2015-06-07 DIAGNOSIS — Z8669 Personal history of other diseases of the nervous system and sense organs: Secondary | ICD-10-CM | POA: Diagnosis not present

## 2015-06-07 DIAGNOSIS — Z794 Long term (current) use of insulin: Secondary | ICD-10-CM | POA: Insufficient documentation

## 2015-06-07 DIAGNOSIS — E118 Type 2 diabetes mellitus with unspecified complications: Secondary | ICD-10-CM

## 2015-06-07 DIAGNOSIS — I10 Essential (primary) hypertension: Secondary | ICD-10-CM | POA: Insufficient documentation

## 2015-06-07 HISTORY — DX: Gastroparesis: K31.84

## 2015-06-07 LAB — URINALYSIS, ROUTINE W REFLEX MICROSCOPIC
Bilirubin Urine: NEGATIVE
Glucose, UA: 100 mg/dL — AB
Ketones, ur: NEGATIVE mg/dL
Leukocytes, UA: NEGATIVE
Nitrite: NEGATIVE
Protein, ur: 100 mg/dL — AB
Specific Gravity, Urine: >1.03 — ABNORMAL HIGH (ref 1.005–1.030)
Urobilinogen, UA: 0.2 mg/dL (ref 0.0–1.0)
pH: 6 (ref 5.0–8.0)

## 2015-06-07 LAB — CBC WITH DIFFERENTIAL/PLATELET
Basophils Absolute: 0 10*3/uL (ref 0.0–0.1)
Basophils Relative: 0 %
Eosinophils Absolute: 0.1 10*3/uL (ref 0.0–0.7)
Eosinophils Relative: 1 %
HCT: 37.8 % — ABNORMAL LOW (ref 39.0–52.0)
Hemoglobin: 13.5 g/dL (ref 13.0–17.0)
Lymphocytes Relative: 32 %
Lymphs Abs: 2.1 10*3/uL (ref 0.7–4.0)
MCH: 31.8 pg (ref 26.0–34.0)
MCHC: 35.7 g/dL (ref 30.0–36.0)
MCV: 88.9 fL (ref 78.0–100.0)
Monocytes Absolute: 0.4 10*3/uL (ref 0.1–1.0)
Monocytes Relative: 7 %
Neutro Abs: 3.9 10*3/uL (ref 1.7–7.7)
Neutrophils Relative %: 60 %
Platelets: 131 10*3/uL — ABNORMAL LOW (ref 150–400)
RBC: 4.25 MIL/uL (ref 4.22–5.81)
RDW: 12.5 % (ref 11.5–15.5)
WBC: 6.4 10*3/uL (ref 4.0–10.5)

## 2015-06-07 LAB — COMPREHENSIVE METABOLIC PANEL
ALT: 43 U/L (ref 17–63)
AST: 34 U/L (ref 15–41)
Albumin: 3.6 g/dL (ref 3.5–5.0)
Alkaline Phosphatase: 55 U/L (ref 38–126)
Anion gap: 3 — ABNORMAL LOW (ref 5–15)
BUN: 21 mg/dL — ABNORMAL HIGH (ref 6–20)
CO2: 30 mmol/L (ref 22–32)
Calcium: 9.1 mg/dL (ref 8.9–10.3)
Chloride: 105 mmol/L (ref 101–111)
Creatinine, Ser: 1 mg/dL (ref 0.61–1.24)
GFR calc Af Amer: >60 mL/min (ref >60)
GFR calc non Af Amer: >60 mL/min (ref >60)
Glucose, Bld: 145 mg/dL — ABNORMAL HIGH (ref 65–99)
Potassium: 4 mmol/L (ref 3.5–5.1)
Sodium: 138 mmol/L (ref 135–145)
Total Bilirubin: 0.7 mg/dL (ref 0.3–1.2)
Total Protein: 7.2 g/dL (ref 6.5–8.1)

## 2015-06-07 LAB — URINE MICROSCOPIC-ADD ON

## 2015-06-07 LAB — TROPONIN I: Troponin I: <0.03 ng/mL (ref <0.031)

## 2015-06-07 LAB — LIPASE, BLOOD: Lipase: 24 U/L (ref 11–51)

## 2015-06-07 MED ORDER — METOCLOPRAMIDE HCL 5 MG/ML IJ SOLN
10.0000 mg | Freq: Once | INTRAMUSCULAR | Status: AC
  Administered 2015-06-07: 10 mg via INTRAVENOUS
  Filled 2015-06-07: qty 2

## 2015-06-07 MED ORDER — PANTOPRAZOLE SODIUM 40 MG IV SOLR
40.0000 mg | Freq: Once | INTRAVENOUS | Status: AC
  Administered 2015-06-07: 40 mg via INTRAVENOUS
  Filled 2015-06-07: qty 40

## 2015-06-07 MED ORDER — SODIUM CHLORIDE 0.9 % IV BOLUS (SEPSIS)
1000.0000 mL | Freq: Once | INTRAVENOUS | Status: AC
  Administered 2015-06-07: 1000 mL via INTRAVENOUS

## 2015-06-07 MED ORDER — TRAMADOL HCL 50 MG PO TABS: 50.0000 mg | ORAL_TABLET | Freq: Four times a day (QID) | ORAL | Status: DC | PRN

## 2015-06-07 MED ORDER — LORAZEPAM 2 MG/ML IJ SOLN
1.0000 mg | Freq: Once | INTRAMUSCULAR | Status: AC
  Administered 2015-06-07: 1 mg via INTRAVENOUS
  Filled 2015-06-07: qty 1

## 2015-06-07 MED ORDER — METOCLOPRAMIDE HCL 10 MG PO TABS: 10.0000 mg | ORAL_TABLET | Freq: Four times a day (QID) | ORAL | Status: DC

## 2015-06-07 NOTE — ED Notes (Signed)
Pt states that he feels better at this time.  Eating graham crackers.

## 2015-06-07 NOTE — ED Notes (Signed)
Pt c/o of nausea and generalized abdominal pain x 2 weeks. Pt states he was treated last week for gastroparesis. States vomiting and diarrhea have resolved. Pt reports unable to see GI doctor until next week.

## 2015-06-07 NOTE — ED Notes (Signed)
Pt verbalizes he is a very hard stick and does not want to be repeatedly stuck if it can be added on to his previous labs.  MD aware.

## 2015-06-07 NOTE — ED Provider Notes (Signed)
CSN: [id number]     Arrival date & time 06/07/15  1237 History   First MD Initiated Contact  Patient 06/07/15 1251     Chief Complaint  Patient presents with  . Abdominal Pain     (Consider location/radiation/quality/duration/timing/severity/associated sxs/prior Treatment) HPI 44 year old male with history of insulin-dependent type 2 diabetes complicated by gastroparesis, esophagitis, gastritis, and GERD, who presents with severe, non-radiating, epigastric abdominal pain, nausea and vomiting. This is reminiscent of his prior episodes of gastroparesis. Symptoms constant and gradually worsening over past day. Was recently seen in the emergency department on October 19 for treatment of this with symptoms improved. Says that she he has had recurrent pain with nausea and vomiting since then. Has not had diarrhea, and reports normal bowel movement this morning. Has not had any fevers, chills, or urinary complaints. No hematemesis, melena, or hematochezia. Denies any chest pain, difficulty breathing, cough or upper respiratory symptoms. Has been compliant with insulin and home medications.    Past Medical History  Diagnosis Date  . Type 2 diabetes mellitus with complications ([place])     diagnosed around age 6  . Glaucoma   . Eye hemorrhage   . Gastritis   . GERD (gastroesophageal reflux disease)   . Esophagitis   . Hiccups   . Helicobacter pylori (H. pylori) infection   . Hypertension   . Gastroparesis    Past Surgical History  Procedure Laterality Date  . Eye surgery    . Esophagogastroduodenoscopy  2015    Dr. Britta Mccreedy  . Esophagogastroduodenoscopy N/A 10/26/2014    RMR: Distal esophagititis-likely reflux related although an element of pill induced injuruy no exclueded  status post biopsy. Diffusely abnormal gastric mucosa of uncertain signigicane -status post gastric biopsy. Focal area of excoriation in the cardia most consistant with a trauma of heaving.   . Eye surgery     Family  History  Problem Relation Age of Onset  . Ovarian cancer Mother   . Cervical cancer Sister   . Diabetes Sister   . Colon cancer Neg Hx    Social History  Substance Use Topics  . Smoking status: Never Smoker   . Smokeless tobacco: Never Used  . Alcohol Use: No    Review of Systems 10/14 systems reviewed and are negative other than those stated in the HPI    Allergies  Fish allergy; Aspirin; Bactrim; Doxycycline; Penicillins; Phenergan; and Zofran  Home Medications   Prior to Admission medications   Medication Sig Start Date End Date Taking? Authorizing Provider  brimonidine-timolol (COMBIGAN) 0.2-0.5 % ophthalmic solution Place 1 drop into both eyes at bedtime.   Yes Historical Provider, MD  gabapentin (NEURONTIN) 600 MG tablet Take 600 mg by mouth daily as needed (for neuropathy).    Yes Historical Provider, MD  hydroxypropyl methylcellulose / hypromellose (ISOPTO TEARS / GONIOVISC) 2.5 % ophthalmic solution Place 1 drop into both eyes as needed for dry eyes.   Yes Historical Provider, MD  insulin aspart protamine- aspart (NOVOLOG MIX 70/30) (70-30) 100 UNIT/ML injection Inject 0.15 mLs (15 Units total) into the skin 3 (three) times daily. Patient taking differently: Inject 35 Units into the skin 3 (three) times daily.  02/20/15  Yes Orvan Falconer, MD  insulin detemir (LEVEMIR) 100 UNIT/ML injection Inject 30 Units into the skin at bedtime.   Yes Historical Provider, MD  lisinopril-hydrochlorothiazide (PRINZIDE,ZESTORETIC) 20-25 MG tablet Take 1 tablet by mouth daily.   Yes Historical Provider, MD  metoCLOPramide (REGLAN) 10 MG tablet Take 1 tablet (  10 mg total) by mouth 4 (four) times daily. 03/19/15  Yes Tanna Furry, MD  pantoprazole (PROTONIX) 40 MG tablet Take 1 tablet (40 mg total) by mouth 2 (two) times daily before a meal. 01/31/15  Yes Carlis Stable, NP  sucralfate (CARAFATE) 1 G tablet Take 1 tablet (1 g total) by mouth 4 (four) times daily. 04/20/15  Yes Mahala Menghini, PA-C   HYDROcodone-acetaminophen (NORCO/VICODIN) 5-325 MG tablet Take 1-2 tablets by mouth every 6 (six) hours as needed. Patient not taking: Reported on 06/07/2015 05/30/15   Fredia Sorrow, MD  metoCLOPramide (REGLAN) 10 MG tablet Take 1 tablet (10 mg total) by mouth every 6 (six) hours. 06/07/15   Forde Dandy, MD  traMADol (ULTRAM) 50 MG tablet Take 1 tablet (50 mg total) by mouth every 6 (six) hours as needed for moderate pain. 06/07/15   Forde Dandy, MD   BP 133/99 mmHg  Pulse 88  Temp(Src) 98 F (36.7 C) (Oral)  Resp 18  Ht 6' (1.829 m)  Wt 270 lb (122.471 kg)  BMI 36.61 kg/m2  SpO2 100% Physical Exam Physical Exam  Nursing note and vitals reviewed. Constitutional: Well developed, well nourished, non-toxic, and in no acute distress Head: Normocephalic and atraumatic.  Mouth/Throat: Oropharynx is clear and moist.  Neck: Normal range of motion. Neck supple.  Cardiovascular: Normal rate and regular rhythm.   Pulmonary/Chest: Effort normal and breath sounds normal.  Abdominal: Soft. Non-distended. Obese. Epigastric tenderness. There is no rebound and no guarding. No tenderness at McBurney's point and negative Murphy's sign.  Musculoskeletal: Normal range of motion.  Neurological: Alert, no facial droop, fluent speech, moves all extremities symmetrically Skin: Skin is warm and dry.  Psychiatric: Cooperative  ED Course  Procedures (including critical care time) Labs Review Labs Reviewed  CBC WITH DIFFERENTIAL/PLATELET - Abnormal; Notable for the following:    HCT 37.8 (*)    Platelets 131 (*)    All other components within normal limits  COMPREHENSIVE METABOLIC PANEL - Abnormal; Notable for the following:    Glucose, Bld 145 (*)    BUN 21 (*)    Anion gap 3 (*)    All other components within normal limits  URINALYSIS, ROUTINE W REFLEX MICROSCOPIC (NOT AT [facility]) - Abnormal; Notable for the following:    Specific Gravity, Urine >1.030 (*)    Glucose, UA 100 (*)    Hgb urine  dipstick SMALL (*)    Protein, ur 100 (*)    All other components within normal limits  LIPASE, BLOOD  TROPONIN I  URINE MICROSCOPIC-ADD ON    I have personally reviewed and evaluated these images and lab results as part of my medical decision-making.   EKG Interpretation   Date/Time:  Thursday June 07 2015 13:55:31 EDT Ventricular Rate:  84 PR Interval:  161 QRS Duration: 78 QT Interval:  370 QTC Calculation: 437 R Axis:   49 Text Interpretation:  Sinus rhythm Low voltage, precordial leads  Anteroseptal infarct, old Borderline T abnormalities, inferior leads No  significant change since last tracing Confirmed by LIU MD, DANA [phone]) on  06/07/2015 10:05:39 PM      MDM   Final diagnoses:  Gastroparesis  Type 2 diabetes mellitus with complication, unspecified long term insulin use status ([place])    In short, this is a 44 year old male with history of insulin-dependent type 2 diabetes complicated by gastroparesis, esophagitis, and gastritis who presents with recurrent nausea and vomiting. States that this is similar to prior episodes  of gastroparesis. He appears well-hydrated, and vital signs overall non-concerning. Abdomen is soft and benign. Blood work shows no significant metabolic or electrolyte derangements. No evidence of DKA. Given IV fluids, Reglan, and Ativan for treatment of gastroparesis. On reevaluation feels symptomatically improved and able to tolerate crackers and fluids at bedside. States that he is comfortable with discharge home. Strict return and follow-up instructions reviewed. He expressed understanding of all discharge instructions and felt comfortable with the plan of care.     Forde Dandy, MD 06/07/15 2216

## 2015-06-07 NOTE — Discharge Instructions (Signed)
Return without fail for worsening symptoms, including chest pain, difficulty breathing, worsening pain, vomiting and unable to keep down food or fluids despite nausea medications, fevers, or any other symptoms concerning to you.  Gastroparesis Gastroparesis, also called delayed gastric emptying, is a condition in which food takes longer than normal to empty from the stomach. The condition is usually long-lasting (chronic). CAUSES This condition may be caused by:  An endocrine disorder, such as hypothyroidism or diabetes. Diabetes is the most common cause of this condition.  A nervous system disease, such as Parkinson disease or multiple sclerosis.  Cancer, infection, or surgery of the stomach or vagus nerve.  A connective tissue disorder, such as scleroderma.  Certain medicines. In most cases, the cause is not known. RISK FACTORS This condition is more likely to develop in:  People with certain disorders, including endocrine disorders, eating disorders, amyloidosis, and scleroderma.  People with certain diseases, including Parkinson disease or multiple sclerosis.  People with cancer or infection of the stomach or vagus nerve.  People who have had surgery on the stomach or vagus nerve.  People who take certain medicines.  Women. SYMPTOMS Symptoms of this condition include:  An early feeling of fullness when eating.  Nausea.  Weight loss.  Vomiting.  Heartburn.  Abdominal bloating.  Inconsistent blood glucose levels.  Lack of appetite.  Acid from the stomach coming up into the esophagus (gastroesophageal reflux).  Spasms of the stomach. Symptoms may come and go. DIAGNOSIS This condition is diagnosed with tests, such as:  Tests that check how long it takes food to move through the stomach and intestines. These tests include:  Upper gastrointestinal (GI) series. In this test, X-rays of the intestines are taken after you drink a liquid. The liquid makes the  intestines show up better on the X-rays.  Gastric emptying scintigraphy. In this test, scans are taken after you eat food that contains a small amount of radioactive material.  Wireless capsule GI monitoring system. This test involves swallowing a capsule that records information about movement through the stomach.  Gastric manometry. This test measures electrical and muscular activity in the stomach. It is done with a thin tube that is passed down the throat and into the stomach.  Endoscopy. This test checks for abnormalities in the lining of the stomach. It is done with a long, thin tube that is passed down the throat and into the stomach.  An ultrasound. This test can help rule out gallbladder disease or pancreatitis as a cause of your symptoms. It uses sound waves to take pictures of the inside of your body. TREATMENT There is no cure for gastroparesis. This condition may be managed with:  Treatment of the underlying condition causing the gastroparesis.  Lifestyle changes, including exercise and dietary changes. Dietary changes can include:  Changes in what and when you eat.  Eating smaller meals more often.  Eating low-fat foods.  Eating low-fiber forms of high-fiber foods, such as cooked vegetables instead of raw vegetables.  Having liquid foods in place of solid foods. Liquid foods are easier to digest.  Medicines. These may be given to control nausea and vomiting and to stimulate stomach muscles.  Getting food through a feeding tube. This may be done in severe cases.  A gastric neurostimulator. This is a device that is inserted into the body with surgery. It helps improve stomach emptying and control nausea and vomiting. HOME CARE INSTRUCTIONS  Follow your health care provider's instructions about exercise and diet.  Take  medicines only as directed by your health care provider. SEEK MEDICAL CARE IF:  Your symptoms do not improve with treatment.  You have new  symptoms. SEEK IMMEDIATE MEDICAL CARE IF:  You have severe abdominal pain that does not improve with treatment.  You have nausea that does not go away.  You cannot keep fluids down.   This information is not intended to replace advice given to you by your health care provider. Make sure you discuss any questions you have with your health care provider.   Document Released: 07/28/2005 Document Revised: 12/12/2014 Document Reviewed: 07/24/2014 Elsevier Interactive Patient Education [employer].

## 2015-06-12 ENCOUNTER — Other Ambulatory Visit: Payer: Self-pay

## 2015-06-14 MED ORDER — PANTOPRAZOLE SODIUM 40 MG PO TBEC: 40.0000 mg | DELAYED_RELEASE_TABLET | Freq: Two times a day (BID) | ORAL | Status: DC

## 2015-07-06 ENCOUNTER — Emergency Department (HOSPITAL_COMMUNITY)
Admission: EM | Admit: 2015-07-06 | Discharge: 2015-07-06 | Disposition: A | Discharge status: Home / Self Care | Payer: Medicaid Other | Attending: Emergency Medicine | Admitting: Emergency Medicine

## 2015-07-06 ENCOUNTER — Encounter (HOSPITAL_COMMUNITY): Payer: Self-pay | Admitting: Emergency Medicine

## 2015-07-06 DIAGNOSIS — Z880 Allergy status to penicillin: Secondary | ICD-10-CM | POA: Diagnosis not present

## 2015-07-06 DIAGNOSIS — Z8619 Personal history of other infectious and parasitic diseases: Secondary | ICD-10-CM | POA: Diagnosis not present

## 2015-07-06 DIAGNOSIS — Z794 Long term (current) use of insulin: Secondary | ICD-10-CM | POA: Diagnosis not present

## 2015-07-06 DIAGNOSIS — Z79899 Other long term (current) drug therapy: Secondary | ICD-10-CM | POA: Insufficient documentation

## 2015-07-06 DIAGNOSIS — K219 Gastro-esophageal reflux disease without esophagitis: Secondary | ICD-10-CM | POA: Insufficient documentation

## 2015-07-06 DIAGNOSIS — G8929 Other chronic pain: Secondary | ICD-10-CM | POA: Diagnosis not present

## 2015-07-06 DIAGNOSIS — R112 Nausea with vomiting, unspecified: Secondary | ICD-10-CM

## 2015-07-06 DIAGNOSIS — E119 Type 2 diabetes mellitus without complications: Secondary | ICD-10-CM | POA: Insufficient documentation

## 2015-07-06 DIAGNOSIS — I10 Essential (primary) hypertension: Secondary | ICD-10-CM | POA: Insufficient documentation

## 2015-07-06 DIAGNOSIS — H409 Unspecified glaucoma: Secondary | ICD-10-CM | POA: Diagnosis not present

## 2015-07-06 DIAGNOSIS — R1013 Epigastric pain: Secondary | ICD-10-CM | POA: Diagnosis not present

## 2015-07-06 DIAGNOSIS — R109 Unspecified abdominal pain: Secondary | ICD-10-CM | POA: Diagnosis present

## 2015-07-06 HISTORY — DX: Nausea with vomiting, unspecified: R11.2

## 2015-07-06 HISTORY — DX: Other chronic pain: G89.29

## 2015-07-06 HISTORY — DX: Unspecified abdominal pain: R10.9

## 2015-07-06 LAB — CBC WITH DIFFERENTIAL/PLATELET
Basophils Absolute: 0 10*3/uL (ref 0.0–0.1)
Basophils Relative: 0 %
Eosinophils Absolute: 0 10*3/uL (ref 0.0–0.7)
Eosinophils Relative: 0 %
HCT: 41.2 % (ref 39.0–52.0)
Hemoglobin: 14.9 g/dL (ref 13.0–17.0)
Lymphocytes Relative: 23 %
Lymphs Abs: 1.9 10*3/uL (ref 0.7–4.0)
MCH: 31.8 pg (ref 26.0–34.0)
MCHC: 36.2 g/dL — ABNORMAL HIGH (ref 30.0–36.0)
MCV: 88 fL (ref 78.0–100.0)
Monocytes Absolute: 0.5 10*3/uL (ref 0.1–1.0)
Monocytes Relative: 6 %
Neutro Abs: 6.1 10*3/uL (ref 1.7–7.7)
Neutrophils Relative %: 71 %
Platelets: 169 10*3/uL (ref 150–400)
RBC: 4.68 MIL/uL (ref 4.22–5.81)
RDW: 12.4 % (ref 11.5–15.5)
WBC: 8.6 10*3/uL (ref 4.0–10.5)

## 2015-07-06 LAB — COMPREHENSIVE METABOLIC PANEL
ALT: 23 U/L (ref 17–63)
AST: 20 U/L (ref 15–41)
Albumin: 4.3 g/dL (ref 3.5–5.0)
Alkaline Phosphatase: 59 U/L (ref 38–126)
Anion gap: 9 (ref 5–15)
BUN: 26 mg/dL — ABNORMAL HIGH (ref 6–20)
CO2: 27 mmol/L (ref 22–32)
Calcium: 9.4 mg/dL (ref 8.9–10.3)
Chloride: 98 mmol/L — ABNORMAL LOW (ref 101–111)
Creatinine, Ser: 1.37 mg/dL — ABNORMAL HIGH (ref 0.61–1.24)
GFR calc Af Amer: >60 mL/min (ref >60)
GFR calc non Af Amer: >60 mL/min (ref >60)
Glucose, Bld: 256 mg/dL — ABNORMAL HIGH (ref 65–99)
Potassium: 4.5 mmol/L (ref 3.5–5.1)
Sodium: 134 mmol/L — ABNORMAL LOW (ref 135–145)
Total Bilirubin: 0.8 mg/dL (ref 0.3–1.2)
Total Protein: 8.1 g/dL (ref 6.5–8.1)

## 2015-07-06 LAB — LIPASE, BLOOD: Lipase: 37 U/L (ref 11–51)

## 2015-07-06 MED ORDER — FENTANYL CITRATE (PF) 100 MCG/2ML IJ SOLN
50.0000 ug | INTRAMUSCULAR | Status: AC | PRN
  Administered 2015-07-06 (×2): 50 ug via INTRAVENOUS
  Filled 2015-07-06 (×2): qty 2

## 2015-07-06 MED ORDER — LORAZEPAM 2 MG/ML IJ SOLN
1.0000 mg | Freq: Once | INTRAMUSCULAR | Status: AC
  Administered 2015-07-06: 1 mg via INTRAVENOUS
  Filled 2015-07-06: qty 1

## 2015-07-06 MED ORDER — SODIUM CHLORIDE 0.9 % IV BOLUS (SEPSIS)
1000.0000 mL | Freq: Once | INTRAVENOUS | Status: AC
  Administered 2015-07-06: 1000 mL via INTRAVENOUS

## 2015-07-06 MED ORDER — FAMOTIDINE IN NACL 20-0.9 MG/50ML-% IV SOLN
20.0000 mg | Freq: Once | INTRAVENOUS | Status: AC
  Administered 2015-07-06: 20 mg via INTRAVENOUS
  Filled 2015-07-06: qty 50

## 2015-07-06 MED ORDER — HYDROCODONE-ACETAMINOPHEN 5-325 MG PO TABS: ORAL_TABLET | ORAL | Status: DC

## 2015-07-06 MED ORDER — METOCLOPRAMIDE HCL 5 MG/ML IJ SOLN
10.0000 mg | Freq: Once | INTRAMUSCULAR | Status: AC
  Administered 2015-07-06: 10 mg via INTRAVENOUS
  Filled 2015-07-06: qty 2

## 2015-07-06 NOTE — ED Notes (Signed)
Called to room by pt with complaints of increase pain in abd. Dr. Elise Benne notified.

## 2015-07-06 NOTE — ED Provider Notes (Signed)
CSN: [id number]     Arrival date & time 07/06/15  1112 History   First MD Initiated Contact with Patient 07/06/15 1208     Chief Complaint  Patient presents with  . Abdominal Pain      HPI Pt was seen at 1225. Per pt, c/o gradual onset and persistence of multiple intermittent episodes of N/V that began yesterday.   Has been associated with acute flair of his chronic abd "pain." Describes his symptoms as "my gastroparesis is acting up again."  Denies CP/SOB, no back pain, no fevers, no black or blood in stools or emesis.  The symptoms have been associated with no other complaints. The patient has a significant history of similar symptoms previously, recently being evaluated for this complaint and multiple prior evals for same.      Past Medical History  Diagnosis Date  . Type 2 diabetes mellitus with complications ([place])     diagnosed around age 67  . Glaucoma   . Eye hemorrhage   . Gastritis   . GERD (gastroesophageal reflux disease)   . Esophagitis   . Hiccups   . Helicobacter pylori (H. pylori) infection   . Hypertension   . Gastroparesis   . Chronic abdominal pain   . Nausea and vomiting     chronic, recurrent   Past Surgical History  Procedure Laterality Date  . Eye surgery    . Esophagogastroduodenoscopy  2015    Dr. Britta Mccreedy  . Esophagogastroduodenoscopy N/A 10/26/2014    RMR: Distal esophagititis-likely reflux related although an element of pill induced injuruy no exclueded  status post biopsy. Diffusely abnormal gastric mucosa of uncertain signigicane -status post gastric biopsy. Focal area of excoriation in the cardia most consistant with a trauma of heaving.   . Eye surgery     Family History  Problem Relation Age of Onset  . Ovarian cancer Mother   . Cervical cancer Sister   . Diabetes Sister   . Colon cancer Neg Hx    Social History  Substance Use Topics  . Smoking status: Never Smoker   . Smokeless tobacco: Never Used  . Alcohol Use: No    Review of  Systems ROS: Statement: All systems negative except as marked or noted in the HPI; Constitutional: Negative for fever and chills. ; ; Eyes: Negative for eye pain, redness and discharge. ; ; ENMT: Negative for ear pain, hoarseness, nasal congestion, sinus pressure and sore throat. ; ; Cardiovascular: Negative for chest pain, palpitations, diaphoresis, dyspnea and peripheral edema. ; ; Respiratory: Negative for cough, wheezing and stridor. ; ; Gastrointestinal: +N/V, abd pain. Negative for diarrhea, blood in stool, hematemesis, jaundice and rectal bleeding. . ; ; Genitourinary: Negative for dysuria, flank pain and hematuria. ; ; Musculoskeletal: Negative for back pain and neck pain. Negative for swelling and trauma.; ; Skin: Negative for pruritus, rash, abrasions, blisters, bruising and skin lesion.; ; Neuro: Negative for headache, lightheadedness and neck stiffness. Negative for weakness, altered level of consciousness , altered mental status, extremity weakness, paresthesias, involuntary movement, seizure and syncope.      Allergies  Fish allergy; Aspirin; Bactrim; Doxycycline; Penicillins; Phenergan; and Zofran  Home Medications   Prior to Admission medications   Medication Sig Start Date End Date Taking? Authorizing Provider  brimonidine-timolol (COMBIGAN) 0.2-0.5 % ophthalmic solution Place 1 drop into both eyes at bedtime.   Yes Historical Provider, MD  gabapentin (NEURONTIN) 600 MG tablet Take 600 mg by mouth daily as needed (for neuropathy).  Yes Historical Provider, MD  hydroxypropyl methylcellulose / hypromellose (ISOPTO TEARS / GONIOVISC) 2.5 % ophthalmic solution Place 1 drop into both eyes as needed for dry eyes.   Yes Historical Provider, MD  insulin aspart protamine- aspart (NOVOLOG MIX 70/30) (70-30) 100 UNIT/ML injection Inject 0.15 mLs (15 Units total) into the skin 3 (three) times daily. Patient taking differently: Inject 35 Units into the skin 3 (three) times daily.  02/20/15  Yes  Orvan Falconer, MD  insulin detemir (LEVEMIR) 100 UNIT/ML injection Inject 30 Units into the skin at bedtime.   Yes Historical Provider, MD  lisinopril-hydrochlorothiazide (PRINZIDE,ZESTORETIC) 20-25 MG tablet Take 1 tablet by mouth daily.   Yes Historical Provider, MD  metoCLOPramide (REGLAN) 10 MG tablet Take 1 tablet (10 mg total) by mouth every 6 (six) hours. 06/07/15  Yes Forde Dandy, MD  pantoprazole (PROTONIX) 40 MG tablet Take 1 tablet (40 mg total) by mouth 2 (two) times daily before a meal. 06/14/15  Yes Mahala Menghini, PA-C  sucralfate (CARAFATE) 1 G tablet Take 1 tablet (1 g total) by mouth 4 (four) times daily. 04/20/15  Yes Mahala Menghini, PA-C  HYDROcodone-acetaminophen (NORCO/VICODIN) 5-325 MG tablet Take 1-2 tablets by mouth every 6 (six) hours as needed. Patient not taking: Reported on 06/07/2015 05/30/15   Fredia Sorrow, MD  metoCLOPramide (REGLAN) 10 MG tablet Take 1 tablet (10 mg total) by mouth 4 (four) times daily. Patient not taking: Reported on 07/06/2015 03/19/15   Tanna Furry, MD  traMADol (ULTRAM) 50 MG tablet Take 1 tablet (50 mg total) by mouth every 6 (six) hours as needed for moderate pain. Patient not taking: Reported on 07/06/2015 06/07/15   Forde Dandy, MD   BP 133/84 mmHg  Pulse 93  Temp(Src) 98.1 F (36.7 C) (Oral)  Resp 16  Ht 6' (1.829 m)  Wt 258 lb (117.028 kg)  BMI 34.98 kg/m2  SpO2 99%   Filed Vitals:   07/06/15 1115 07/06/15 1330 07/06/15 1400  BP: 133/84 120/84 137/92  Pulse: 93 87 88  Temp: 98.1 F (36.7 C)    TempSrc: Oral    Resp: 16    Height: 6' (1.829 m)    Weight: 258 lb (117.028 kg)    SpO2: 99% 95% 97%    Physical Exam  1230: Physical examination:  Nursing notes reviewed; Vital signs and O2 SAT reviewed;  Constitutional: Well developed, Well nourished, Well hydrated, In no acute distress; Head:  Normocephalic, atraumatic; Eyes: EOMI, PERRL, No scleral icterus; ENMT: Mouth and pharynx normal, Mucous membranes moist; Neck: Supple, Full  range of motion, No lymphadenopathy; Cardiovascular: Regular rate and rhythm, No gallop; Respiratory: Breath sounds clear & equal bilaterally, No wheezes.  Speaking full sentences with ease, Normal respiratory effort/excursion; Chest: Nontender, Movement normal; Abdomen: Soft, +mid-epigastric tenderness, no rebound or guarding. Nondistended, Normal bowel sounds; Genitourinary: No CVA tenderness; Extremities: Pulses normal, No tenderness, No edema, No calf edema or asymmetry.; Neuro: AA&Ox3, Major CN grossly intact.  Speech clear. No gross focal motor or sensory deficits in extremities.; Skin: Color normal, Warm, Dry.   ED Course  Procedures (including critical care time) Labs Review   Imaging Review  I have personally reviewed and evaluated these images and lab results as part of my medical decision-making.   EKG Interpretation None      MDM  MDM Reviewed: previous chart, nursing note and vitals Reviewed previous: labs Interpretation: labs   Results for orders placed or performed during the hospital encounter of 07/06/15  CBC with Differential  Result Value Ref Range   WBC 8.6 4.0 - 10.5 K/uL   RBC 4.68 4.22 - 5.81 MIL/uL   Hemoglobin 14.9 13.0 - 17.0 g/dL   HCT 41.2 39.0 - 52.0 %   MCV 88.0 78.0 - 100.0 fL   MCH 31.8 26.0 - 34.0 pg   MCHC 36.2 (H) 30.0 - 36.0 g/dL   RDW 12.4 11.5 - 15.5 %   Platelets 169 150 - 400 K/uL   Neutrophils Relative % 71 %   Neutro Abs 6.1 1.7 - 7.7 K/uL   Lymphocytes Relative 23 %   Lymphs Abs 1.9 0.7 - 4.0 K/uL   Monocytes Relative 6 %   Monocytes Absolute 0.5 0.1 - 1.0 K/uL   Eosinophils Relative 0 %   Eosinophils Absolute 0.0 0.0 - 0.7 K/uL   Basophils Relative 0 %   Basophils Absolute 0.0 0.0 - 0.1 K/uL  Comprehensive metabolic panel  Result Value Ref Range   Sodium 134 (L) 135 - 145 mmol/L   Potassium 4.5 3.5 - 5.1 mmol/L   Chloride 98 (L) 101 - 111 mmol/L   CO2 27 22 - 32 mmol/L   Glucose, Bld 256 (H) 65 - 99 mg/dL   BUN 26 (H) 6  - 20 mg/dL   Creatinine, Ser 1.37 (H) 0.61 - 1.24 mg/dL   Calcium 9.4 8.9 - 10.3 mg/dL   Total Protein 8.1 6.5 - 8.1 g/dL   Albumin 4.3 3.5 - 5.0 g/dL   AST 20 15 - 41 U/L   ALT 23 17 - 63 U/L   Alkaline Phosphatase 59 38 - 126 U/L   Total Bilirubin 0.8 0.3 - 1.2 mg/dL   GFR calc non Af Amer >60 >60 mL/min   GFR calc Af Amer >60 >60 mL/min   Anion gap 9 5 - 15  Lipase, blood  Result Value Ref Range   Lipase 37 11 - 51 U/L    1420:  IVF, IV reglan, pepcid, and ativan given for symptoms. Pt requested "something else for pain." IV fentanyl given. Pt has tol PO well while in the ED without N/V.  No stooling while in the ED.  Abd benign, VSS. Feels better and wants to go home now. States he "has enough" reglan and carafate at home and does not need rx. Also states he has a f/u appt on Monday with his GI MD at [place] hx of chronic pain and N/V, with multiple ED visits for same.  Pt endorses acute flair of his usual long standing chronic symptoms today, no change from his usual chronic symptom pattern.  Pt encouraged to f/u with his GI MD for good continuity of care and control of his chronic symptoms.  Verb understanding.      Francine Graven, DO 07/10/15 1912

## 2015-07-06 NOTE — ED Notes (Signed)
Pt given gingerale to begin fluid challenge

## 2015-07-06 NOTE — ED Notes (Signed)
Pt states that he feels that his gastroparesis is acting up again.  States that he started having abd pain and vomiting x 3 yesterday.  Had gastric emptying study done at [facility].

## 2015-07-06 NOTE — Discharge Instructions (Signed)
°Emergency Department Resource Guide °1) Find a Doctor and Pay Out of Pocket °Although you won't have to find out who is covered by your insurance plan, it is a good idea to ask around and get recommendations. You will then need to call the office and see if the doctor you have chosen will accept you as a new patient and what types of options they offer for patients who are self-pay. Some doctors offer discounts or will set up payment plans for their patients who do not have insurance, but you will need to ask so you aren't surprised when you get to your appointment. ° °2) Contact Your Local Health Department °Not all health departments have doctors that can see patients for sick visits, but many do, so it is worth a call to see if yours does. If you don't know where your local health department is, you can check in your phone book. The CDC also has a tool to help you locate your state's health department, and many state websites also have listings of all of their local health departments. ° °3) Find a Walk-in Clinic °If your illness is not likely to be very severe or complicated, you may want to try a walk in clinic. These are popping up all over the country in pharmacies, drugstores, and shopping centers. They're usually staffed by nurse practitioners or physician assistants that have been trained to treat common illnesses and complaints. They're usually fairly quick and inexpensive. However, if you have serious medical issues or chronic medical problems, these are probably not your best option. ° °No Primary Care Doctor: °- Call [facility] at  832-8000 - they can help you locate a primary care doctor that  accepts your insurance, provides certain services, etc. °- Physician Referral Service- 1-800-533-3463 ° °Chronic Pain Problems: °Organization         Address  Phone   Notes  °[facility]  ([phone]) [phone] Patients need to be referred by their primary care doctor.  ° °Medication  Assistance: °Organization         Address  Phone   Notes  °[place] [facility] [address] °[place], [place] [address] (336) 641-8030 --Must be a resident of [place] °-- Must have NO insurance coverage whatsoever (no Medicaid/ Medicare, etc.) °-- The pt. MUST have a primary care doctor that directs their care regularly and follows them in the community °  °[facility]  (866) 331-1348   °[employer]  (888) 892-1162   ° °Agencies that provide inexpensive medical care: °Organization         Address  Phone   Notes  °[facility]  (336) 832-8035   °[facility]    (336) 832-7272   °[facility] [address] °[place], [place] [address] (336) 832-4777   °[facility] [address], °[place] (336) 271-4999   °[facility]    (336) 373-0678   °[facility]    (336) 272-1050   °[facility] ° [address],  Phone:  (336) 832-4444, Fax:  (336) 832-4440 Hours of Operation:  9 am - 6 pm, M-F.  Also accepts Medicaid/Medicare and self-pay.  °[facility] ° [address],  Phone: (336) 832-3150, Fax: (336) 832-3151. Hours of Operation:  8:30 am - 5:30 pm, M-F.  Also accepts Medicaid and self-pay.  °[facility] [place] [address], [place] Phone: (336) 878-6027   °[facility] [address], [place], [place] (336)723-1848, Ext. 123 Mondays & Thursdays: 7-9 AM.  First 15 patients are seen on a first come, first serve basis. °  ° °Medicaid-accepting [place] Providers: ° °Organization         Address  Phone   Notes  °[facility] [address], [place] (336) [phone] Also accepts self-pay patients.  °[facility] [address], [place] ° (336) 856-9996   °[facility] [address], [place]  (336) 288-8857   °[facility] [address] [place] [address], [place] (336) 299-7000   °Veita Bland [address], [place]  ° (336) 373-1557 Only accepts [place] Access Medicaid patients after they have their name applied to their card.  ° °Self-Pay (no insurance) in [place]: ° °Organization         Address  Phone   Notes  °Sickle Cell Patients, [facility] [address], [place] (336) 832-1970   °[facility] [address], [place] (336) 832-4400   °[facility] ° [address], [place] (336) 992-4800   °[facility]/Dr. Osei-Bonsu ° [address] [place] [address], [place] or [address], [place] (336) [phone] Phone number for both [place] and [place] locations is the same.  °[facility] [address], [place] (336) 299-0000   °[facility] [address] [place] [address], [place] or [address] (336) 852-7530 °(336) 878-2260   °[facility] [address], [place] (336) 350-1642, phone; (336) 294-5005, fax Sees patients 1st and 3rd Saturday of every month.  Must not qualify for public or private insurance (i.e. Medicaid, Medicare, [place] Health Choice, Veterans' Benefits) • Household income should be no more than 200% of the poverty level •The clinic cannot treat you if you are pregnant or think you are pregnant • Sexually transmitted diseases are not treated at the clinic.  ° ° °Dental Care: °Organization         Address  Phone  Notes  °[place] [facility] [address], [place] (336) 641-6152 Accepts children up to age 21 who are enrolled in Medicaid or [place] Health Choice; pregnant women with a Medicaid card; and children who have applied for Medicaid or [place] Health Choice, but were declined, whose parents can pay a reduced fee at time of service.  °[place]  [facility] [place]  [address], [place] (336) 641-7733 Accepts children up to age 21 who are enrolled in Medicaid or [place] Health Choice; pregnant women with a Medicaid card; and children who have applied for Medicaid or [place] Health Choice, but were declined, whose parents can pay a reduced fee at time of service.  °[facility] ° [address], [place] (336) 641-4533 Patients are seen by appointment only. Walk-ins are not accepted. [facility] will see patients 18 years of age and older. °Monday - Tuesday (8am-5pm) °Most Wednesdays (8:30-5pm) °$30 per visit, cash only  °[facility] ° [address], [place] (336) 641-4533 Patients are seen by appointment only. Walk-ins are not accepted. [facility] will see patients 18 years of age and older. °One   Wednesday Evening (Monthly: Volunteer Based).  $30 per visit, cash only  °[facility]  (919) 537-3737 for adults; Children under age 4, call Graduate Pediatric Dentistry at ([phone]) [phone]. Children aged 4-14, please call ([phone]) [phone] to request a pediatric application. ° Dental services are provided in all areas of dental care including fillings, crowns and bridges, complete and partial dentures, implants, gum treatment, root canals, and extractions. Preventive care is also provided. Treatment is provided to both adults and children. °Patients are selected via a lottery and there is often a waiting list. °  °[facility] [address], °[place] ° (336) 763-8833 [email or web address] °  °[facility] [address], [place], [place] (336)723-1848, Ext. 123 Second and Fourth Thursday of each month, opens at 6:30 AM; Clinic ends at 9 AM.  Patients are seen on a first-come first-served basis, and a limited number are seen during each clinic.  ° °[facility] ° [address], [place], [place] (336) 723-7904    Eligibility Requirements °You must have lived in [place], [place], or [place] counties for at least the last three months. °  You cannot be eligible for state or federal sponsored healthcare insurance, including Veterans Administration, Medicaid, or Medicare. °  You generally cannot be eligible for healthcare insurance through your employer.  °  How to apply: °Eligibility screenings are held every Tuesday and Wednesday afternoon from 1:00 pm until 4:00 pm. You do not need an appointment for the interview!  °[facility] [address], [place], [place] 336-631-2330   °[facility]  336-342-8273   °[facility]  336-703-3100   °[facility]  336-570-6415   ° °Behavioral Health Resources in the Community: °Intensive Outpatient Programs °Organization         Address  Phone  Notes  °[facility] [address], [place], [place] 336-878-6098   °[facility] [address], [place], [place] 336-832-9800   °[facility] [address], [place], [place] ° 336-882-2125   °[facility] [address],  °[place], [place] 1-800-853-5163 or 336-641-4981   °Substance Abuse Resources °Organization         Address  Phone  Notes  °[facility]  336-882-2125   °[facility]  336-784-9470   °[facility]  336-285-9073   °[facility]  336-845-3988   °Residential & Outpatient Substance Abuse Program  1-800-659-3381   °Psychological Services °Organization         Address  Phone  Notes  °[facility]  336- 832-9600   °[facility]  336- 378-7881   °[facility] [address], [place] 1-800-853-5163 or 336-641-4981   ° °Mobile Crisis Teams °Organization         Address  Phone  Notes  °[facility], Mobile Crisis Care Unit  1-877-626-1772   °Assertive °[facility] ° [address]  [place], [place] 336-834-9664   °Sharon DeEsch [address] °[place] 336-554-5454   ° °Self-Help/Support Groups °Organization         Address  Phone             Notes  °[facility]  - variety of support groups  336- 373-1402 Call for more information  °Narcotics Anonymous (NA), [facility] [address], °[place]  2 meetings at this location  ° °  Residential Treatment Programs Organization         Address  Phone  Notes  [facility] [address]    [place]  1-418-318-9512   [facility]  [address] [place] [id number], [place], [place]   [place], [place] [phone] Admissions: 8am-3pm M-F  [facility] [place] [address],    [place], [place] [phone]   [facility] [address] [place], [place], [place]   [facility] [address],  [place], [place]   [facility] Intensive Outpatient [address], [place], [place]   [facility] [place]  [place], [place] 1-(505)819-8437 or 504-735-8238   [facility] [address] [place], [place] Accepts Medicaid  [facility] [address]  [place] 1-254-310-6056 Substance Abuse/Addiction Treatment   [facility]         Address  Phone  Notes  [facility]  910-422-0263   Domenic Schwab, PhD [address] [place], [place]   (709) 560-7449 or (629)201-7290   [place], [place] 516-304-0672   [facility] [address], [place], [place] [phone] Insurance/Medicaid/sponsorship through [facility] and Families [address] [place]                                    [place], [place] [phone] [place] [address], [place] [phone]    Dr. Adele Schilder  [phone]   [facility] Dept. 1) [address] [place] 2) [place] 3)  [address], [place] [phone]  (586)[phone]   [place] (417)116-5918 or 316-859-7054 (After Hours)      Take the prescription as directed. Continue to take your usual medications as previously prescribed. Follow your gastroparesis diet. Increase your fluid intake for the next several days.  Call your regular GI doctor on Monday to schedule a follow up appointment within the next 3 days.  Return to the Emergency Department immediately sooner if worsening.

## 2015-07-12 ENCOUNTER — Encounter (HOSPITAL_COMMUNITY): Payer: Self-pay | Admitting: Cardiology

## 2015-07-12 ENCOUNTER — Emergency Department (HOSPITAL_COMMUNITY)
Admission: EM | Admit: 2015-07-12 | Discharge: 2015-07-12 | Disposition: A | Discharge status: Home / Self Care | Payer: Medicaid Other | Attending: Emergency Medicine | Admitting: Emergency Medicine

## 2015-07-12 DIAGNOSIS — R112 Nausea with vomiting, unspecified: Secondary | ICD-10-CM

## 2015-07-12 DIAGNOSIS — Z79899 Other long term (current) drug therapy: Secondary | ICD-10-CM | POA: Diagnosis not present

## 2015-07-12 DIAGNOSIS — E119 Type 2 diabetes mellitus without complications: Secondary | ICD-10-CM | POA: Insufficient documentation

## 2015-07-12 DIAGNOSIS — H409 Unspecified glaucoma: Secondary | ICD-10-CM | POA: Insufficient documentation

## 2015-07-12 DIAGNOSIS — Z880 Allergy status to penicillin: Secondary | ICD-10-CM | POA: Diagnosis not present

## 2015-07-12 DIAGNOSIS — K3184 Gastroparesis: Secondary | ICD-10-CM

## 2015-07-12 DIAGNOSIS — I10 Essential (primary) hypertension: Secondary | ICD-10-CM | POA: Diagnosis not present

## 2015-07-12 DIAGNOSIS — G8929 Other chronic pain: Secondary | ICD-10-CM | POA: Insufficient documentation

## 2015-07-12 DIAGNOSIS — Z794 Long term (current) use of insulin: Secondary | ICD-10-CM | POA: Insufficient documentation

## 2015-07-12 DIAGNOSIS — Z8619 Personal history of other infectious and parasitic diseases: Secondary | ICD-10-CM | POA: Diagnosis not present

## 2015-07-12 DIAGNOSIS — K219 Gastro-esophageal reflux disease without esophagitis: Secondary | ICD-10-CM | POA: Diagnosis not present

## 2015-07-12 LAB — URINALYSIS, ROUTINE W REFLEX MICROSCOPIC
Bilirubin Urine: NEGATIVE
Glucose, UA: >1000 mg/dL — AB
Ketones, ur: NEGATIVE mg/dL
Leukocytes, UA: NEGATIVE
Nitrite: NEGATIVE
Protein, ur: 100 mg/dL — AB
Specific Gravity, Urine: 1.02 (ref 1.005–1.030)
pH: 6 (ref 5.0–8.0)

## 2015-07-12 LAB — LIPASE, BLOOD: Lipase: 29 U/L (ref 11–51)

## 2015-07-12 LAB — URINE MICROSCOPIC-ADD ON
Bacteria, UA: NONE SEEN
Squamous Epithelial / LPF: NONE SEEN
WBC, UA: NONE SEEN WBC/hpf (ref 0–5)

## 2015-07-12 LAB — CBC WITH DIFFERENTIAL/PLATELET
Basophils Absolute: 0 10*3/uL (ref 0.0–0.1)
Basophils Relative: 0 %
Eosinophils Absolute: 0 10*3/uL (ref 0.0–0.7)
Eosinophils Relative: 1 %
HCT: 39.1 % (ref 39.0–52.0)
Hemoglobin: 13.9 g/dL (ref 13.0–17.0)
Lymphocytes Relative: 20 %
Lymphs Abs: 1.4 10*3/uL (ref 0.7–4.0)
MCH: 31.1 pg (ref 26.0–34.0)
MCHC: 35.5 g/dL (ref 30.0–36.0)
MCV: 87.5 fL (ref 78.0–100.0)
Monocytes Absolute: 0.5 10*3/uL (ref 0.1–1.0)
Monocytes Relative: 6 %
Neutro Abs: 5.2 10*3/uL (ref 1.7–7.7)
Neutrophils Relative %: 73 %
Platelets: 151 10*3/uL (ref 150–400)
RBC: 4.47 MIL/uL (ref 4.22–5.81)
RDW: 12.2 % (ref 11.5–15.5)
WBC: 7.2 10*3/uL (ref 4.0–10.5)

## 2015-07-12 LAB — COMPREHENSIVE METABOLIC PANEL
ALT: 18 U/L (ref 17–63)
AST: 17 U/L (ref 15–41)
Albumin: 3.7 g/dL (ref 3.5–5.0)
Alkaline Phosphatase: 53 U/L (ref 38–126)
Anion gap: 5 (ref 5–15)
BUN: 15 mg/dL (ref 6–20)
CO2: 26 mmol/L (ref 22–32)
Calcium: 8.9 mg/dL (ref 8.9–10.3)
Chloride: 107 mmol/L (ref 101–111)
Creatinine, Ser: 0.98 mg/dL (ref 0.61–1.24)
GFR calc Af Amer: >60 mL/min (ref >60)
GFR calc non Af Amer: >60 mL/min (ref >60)
Glucose, Bld: 218 mg/dL — ABNORMAL HIGH (ref 65–99)
Potassium: 4 mmol/L (ref 3.5–5.1)
Sodium: 138 mmol/L (ref 135–145)
Total Bilirubin: 0.5 mg/dL (ref 0.3–1.2)
Total Protein: 7.2 g/dL (ref 6.5–8.1)

## 2015-07-12 LAB — CBG MONITORING, ED: Glucose-Capillary: 160 mg/dL — ABNORMAL HIGH (ref 65–99)

## 2015-07-12 LAB — TROPONIN I: Troponin I: <0.03 ng/mL (ref <0.031)

## 2015-07-12 MED ORDER — LORAZEPAM 2 MG/ML IJ SOLN
1.0000 mg | Freq: Once | INTRAMUSCULAR | Status: AC
  Administered 2015-07-12: 1 mg via INTRAVENOUS
  Filled 2015-07-12: qty 1

## 2015-07-12 MED ORDER — OXYCODONE-ACETAMINOPHEN 5-325 MG PO TABS: 1.0000 | ORAL_TABLET | ORAL | Status: DC | PRN

## 2015-07-12 MED ORDER — DIPHENHYDRAMINE HCL 50 MG/ML IJ SOLN: 12.5000 mg | Freq: Once | INTRAMUSCULAR | Status: DC

## 2015-07-12 MED ORDER — MORPHINE SULFATE (PF) 4 MG/ML IV SOLN
4.0000 mg | Freq: Once | INTRAVENOUS | Status: AC
  Administered 2015-07-12: 4 mg via INTRAVENOUS
  Filled 2015-07-12: qty 1

## 2015-07-12 MED ORDER — GI COCKTAIL ~~LOC~~
30.0000 mL | Freq: Once | ORAL | Status: DC
  Filled 2015-07-12: qty 30

## 2015-07-12 MED ORDER — METOCLOPRAMIDE HCL 5 MG/ML IJ SOLN
10.0000 mg | Freq: Once | INTRAMUSCULAR | Status: AC
  Administered 2015-07-12: 10 mg via INTRAVENOUS
  Filled 2015-07-12: qty 2

## 2015-07-12 MED ORDER — SUCRALFATE 1 GM/10ML PO SUSP
1.0000 g | Freq: Three times a day (TID) | ORAL | Status: DC
  Administered 2015-07-12: 1 g via ORAL
  Filled 2015-07-12: qty 10

## 2015-07-12 NOTE — ED Notes (Signed)
Abdominal pain and vomiting this morning.

## 2015-07-12 NOTE — ED Notes (Signed)
PT stated hx of gastroparesis and starting having upper intestinal pain with n/v x4 this am. PT denies any diarrhea or urinary symptoms.

## 2015-07-12 NOTE — ED Notes (Signed)
When this nurse went into room to give pt the GI cocktail, he stated that he was allergic to this medication - anaphalaxis reaction by descritption

## 2015-07-12 NOTE — Discharge Instructions (Signed)
Gastroparesis °Gastroparesis, also called delayed gastric emptying, is a condition in which food takes longer than normal to empty from the stomach. The condition is usually long-lasting (chronic). °CAUSES °This condition may be caused by: °· An endocrine disorder, such as hypothyroidism or diabetes. Diabetes is the most common cause of this condition. °· A nervous system disease, such as Parkinson disease or multiple sclerosis. °· Cancer, infection, or surgery of the stomach or vagus nerve. °· A connective tissue disorder, such as scleroderma. °· Certain medicines. °In most cases, the cause is not known. °RISK FACTORS °This condition is more likely to develop in: °· People with certain disorders, including endocrine disorders, eating disorders, amyloidosis, and scleroderma. °· People with certain diseases, including Parkinson disease or multiple sclerosis. °· People with cancer or infection of the stomach or vagus nerve. °· People who have had surgery on the stomach or vagus nerve. °· People who take certain medicines. °· Women. °SYMPTOMS °Symptoms of this condition include: °· An early feeling of fullness when eating. °· Nausea. °· Weight loss. °· Vomiting. °· Heartburn. °· Abdominal bloating. °· Inconsistent blood glucose levels. °· Lack of appetite. °· Acid from the stomach coming up into the esophagus (gastroesophageal reflux). °· Spasms of the stomach. °Symptoms may come and go. °DIAGNOSIS °This condition is diagnosed with tests, such as: °· Tests that check how long it takes food to move through the stomach and intestines. These tests include: °¨ Upper gastrointestinal (GI) series. In this test, X-rays of the intestines are taken after you drink a liquid. The liquid makes the intestines show up better on the X-rays. °¨ Gastric emptying scintigraphy. In this test, scans are taken after you eat food that contains a small amount of radioactive material. °¨ Wireless capsule GI monitoring system. This test  involves swallowing a capsule that records information about movement through the stomach. °· Gastric manometry. This test measures electrical and muscular activity in the stomach. It is done with a thin tube that is passed down the throat and into the stomach. °· Endoscopy. This test checks for abnormalities in the lining of the stomach. It is done with a long, thin tube that is passed down the throat and into the stomach. °· An ultrasound. This test can help rule out gallbladder disease or pancreatitis as a cause of your symptoms. It uses sound waves to take pictures of the inside of your body. °TREATMENT °There is no cure for gastroparesis. This condition may be managed with: °· Treatment of the underlying condition causing the gastroparesis. °· Lifestyle changes, including exercise and dietary changes. Dietary changes can include: °¨ Changes in what and when you eat. °¨ Eating smaller meals more often. °¨ Eating low-fat foods. °¨ Eating low-fiber forms of high-fiber foods, such as cooked vegetables instead of raw vegetables. °¨ Having liquid foods in place of solid foods. Liquid foods are easier to digest. °· Medicines. These may be given to control nausea and vomiting and to stimulate stomach muscles. °· Getting food through a feeding tube. This may be done in severe cases. °· A gastric neurostimulator. This is a device that is inserted into the body with surgery. It helps improve stomach emptying and control nausea and vomiting. °HOME CARE INSTRUCTIONS °· Follow your health care provider's instructions about exercise and diet. °· Take medicines only as directed by your health care provider. °SEEK MEDICAL CARE IF: °· Your symptoms do not improve with treatment. °· You have new symptoms. °SEEK IMMEDIATE MEDICAL CARE IF: °· You have   severe abdominal pain that does not improve with treatment. °· You have nausea that does not go away. °· You cannot keep fluids down. °  °This information is not intended to replace  advice given to you by your health care provider. Make sure you discuss any questions you have with your health care provider. °  °Document Released: 07/28/2005 Document Revised: 12/12/2014 Document Reviewed: 07/24/2014 °Elsevier Interactive Patient Education ©2016 [employer] ° °

## 2015-07-12 NOTE — ED Notes (Signed)
Informed J Idol PA that patient continuing to have pain- verbal order for GI cocktail given -- verified that OK to give with allergy too Zofran

## 2015-07-12 NOTE — ED Provider Notes (Signed)
CSN: [id number]     Arrival date & time 07/12/15  1036 History   First MD Initiated Contact with Patient 07/12/15 1101     Chief Complaint  Patient presents with  . Emesis     (Consider location/radiation/quality/duration/timing/severity/associated sxs/prior Treatment) The history is provided by the patient.   Timothy Clark is a 44 y.o. male with a history of type 2 diabetes including gastritis and gastroparesis which is not well controlled on chronic Reglan , Protonix and Carafate presenting with nausea and emesis along with epigastric abdominal pain which started around 4 AM today.  He states he typically has these symptoms every 3-4 weeks, however was just seen for the same symptom 6 days ago here, so is little surprising to him that it has returned.  He is currently being followed by Dr. Buford Dresser but is also undergoing specialized GI testing at [facility] in attempt to get these symptoms under better control.  He denies fevers or chills, has had no diarrhea or constipation, denies abdominal distention, dysuria or hematuria.  He also denies chest pain or shortness of breath.  He has attempted to take Reglan prior to arrival but has been unable to maintain it.       Past Medical History  Diagnosis Date  . Type 2 diabetes mellitus with complications ([place])     diagnosed around age 17  . Glaucoma   . Eye hemorrhage   . Gastritis   . GERD (gastroesophageal reflux disease)   . Esophagitis   . Hiccups   . Helicobacter pylori (H. pylori) infection   . Hypertension   . Gastroparesis   . Chronic abdominal pain   . Nausea and vomiting     chronic, recurrent   Past Surgical History  Procedure Laterality Date  . Eye surgery    . Esophagogastroduodenoscopy  2015    Dr. Britta Mccreedy  . Esophagogastroduodenoscopy N/A 10/26/2014    RMR: Distal esophagititis-likely reflux related although an element of pill induced injuruy no exclueded  status post biopsy. Diffusely abnormal gastric mucosa of  uncertain signigicane -status post gastric biopsy. Focal area of excoriation in the cardia most consistant with a trauma of heaving.   . Eye surgery     Family History  Problem Relation Age of Onset  . Ovarian cancer Mother   . Cervical cancer Sister   . Diabetes Sister   . Colon cancer Neg Hx    Social History  Substance Use Topics  . Smoking status: Never Smoker   . Smokeless tobacco: Never Used  . Alcohol Use: No    Review of Systems  Constitutional: Negative for fever and chills.  HENT: Negative for congestion and sore throat.   Eyes: Negative.   Respiratory: Negative for chest tightness and shortness of breath.   Cardiovascular: Negative for chest pain.  Gastrointestinal: Positive for nausea, vomiting and abdominal pain. Negative for diarrhea, constipation and abdominal distention.  Genitourinary: Negative.  Negative for dysuria.  Musculoskeletal: Negative for joint swelling, arthralgias and neck pain.  Skin: Negative.  Negative for rash and wound.  Neurological: Negative for dizziness, weakness, light-headedness, numbness and headaches.  Psychiatric/Behavioral: Negative.       Allergies  Donnatal; Fish allergy; Lidocaine; Maalox; Aspirin; Bactrim; Doxycycline; Penicillins; Phenergan; and Zofran  Home Medications   Prior to Admission medications   Medication Sig Start Date End Date Taking? Authorizing Provider  brimonidine-timolol (COMBIGAN) 0.2-0.5 % ophthalmic solution Place 1 drop into both eyes at bedtime.   Yes Historical Provider, MD  gabapentin (NEURONTIN) 600 MG tablet Take 600 mg by mouth daily as needed (for neuropathy).    Yes Historical Provider, MD  hydroxypropyl methylcellulose / hypromellose (ISOPTO TEARS / GONIOVISC) 2.5 % ophthalmic solution Place 1 drop into both eyes as needed for dry eyes.   Yes Historical Provider, MD  insulin aspart protamine- aspart (NOVOLOG MIX 70/30) (70-30) 100 UNIT/ML injection Inject 0.15 mLs (15 Units total) into the skin 3  (three) times daily. Patient taking differently: Inject 35 Units into the skin 3 (three) times daily.  02/20/15  Yes Orvan Falconer, MD  insulin detemir (LEVEMIR) 100 UNIT/ML injection Inject 30 Units into the skin at bedtime.   Yes Historical Provider, MD  lisinopril-hydrochlorothiazide (PRINZIDE,ZESTORETIC) 20-25 MG tablet Take 1 tablet by mouth daily.   Yes Historical Provider, MD  metoCLOPramide (REGLAN) 10 MG tablet Take 1 tablet (10 mg total) by mouth every 6 (six) hours. 06/07/15  Yes Forde Dandy, MD  pantoprazole (PROTONIX) 40 MG tablet Take 1 tablet (40 mg total) by mouth 2 (two) times daily before a meal. 06/14/15  Yes Mahala Menghini, PA-C  sucralfate (CARAFATE) 1 G tablet Take 1 tablet (1 g total) by mouth 4 (four) times daily. 04/20/15  Yes Mahala Menghini, PA-C  HYDROcodone-acetaminophen (NORCO/VICODIN) 5-325 MG tablet 1 or 2 tabs PO q6 hours prn pain Patient not taking: Reported on 07/12/2015 07/06/15   Francine Graven, DO  metoCLOPramide (REGLAN) 10 MG tablet Take 1 tablet (10 mg total) by mouth 4 (four) times daily. Patient not taking: Reported on 07/06/2015 03/19/15   Tanna Furry, MD  oxyCODONE-acetaminophen (PERCOCET/ROXICET) 5-325 MG tablet Take 1 tablet by mouth every 4 (four) hours as needed. 07/12/15   Evalee Jefferson, PA-C  traMADol (ULTRAM) 50 MG tablet Take 1 tablet (50 mg total) by mouth every 6 (six) hours as needed for moderate pain. Patient not taking: Reported on 07/06/2015 06/07/15   Forde Dandy, MD   BP 158/97 mmHg  Pulse 86  Temp(Src) 97.8 F (36.6 C) (Oral)  Resp 10  Ht 6' (1.829 m)  Wt 113.399 kg  BMI 33.90 kg/m2  SpO2 100% Physical Exam  Constitutional: He appears well-developed and well-nourished.  HENT:  Head: Normocephalic and atraumatic.  Mouth/Throat: Oropharynx is clear and moist.  Eyes: Conjunctivae are normal.  Neck: Normal range of motion.  Cardiovascular: Normal rate, regular rhythm, normal heart sounds and intact distal pulses.   Pulmonary/Chest: Effort  normal and breath sounds normal. He has no wheezes.  Abdominal: Soft. Bowel sounds are normal. He exhibits no mass. There is no hepatosplenomegaly. There is tenderness in the epigastric area. There is no rebound, no guarding and no CVA tenderness.  Musculoskeletal: Normal range of motion.  Neurological: He is alert.  Skin: Skin is warm and dry.  Psychiatric: He has a normal mood and affect.  Nursing note and vitals reviewed.   ED Course  Procedures (including critical care time) Labs Review Labs Reviewed  COMPREHENSIVE METABOLIC PANEL - Abnormal; Notable for the following:    Glucose, Bld 218 (*)    All other components within normal limits  URINALYSIS, ROUTINE W REFLEX MICROSCOPIC (NOT AT [facility]) - Abnormal; Notable for the following:    Glucose, UA >1000 (*)    Hgb urine dipstick MODERATE (*)    Protein, ur 100 (*)    All other components within normal limits  CBG MONITORING, ED - Abnormal; Notable for the following:    Glucose-Capillary 160 (*)    All other components within  normal limits  LIPASE, BLOOD  CBC WITH DIFFERENTIAL/PLATELET  TROPONIN I  URINE MICROSCOPIC-ADD ON    Imaging Review No results found. I have personally reviewed and evaluated these images and lab results as part of my medical decision-making.   EKG Interpretation None      MDM   Final diagnoses:  Gastroparesis  Non-intractable vomiting with nausea, vomiting of unspecified type    Patients labs reviewed.  Radiological studies were viewed, interpreted and considered during the medical decision making and disposition process. I agree with radiologists reading.  Results were also discussed with patient.   Medications  sucralfate (CARAFATE) 1 GM/10ML suspension 1 g (1 g Oral Given 07/12/15 1343)  metoCLOPramide (REGLAN) injection 10 mg (10 mg Intravenous Given 07/12/15 1156)  LORazepam (ATIVAN) injection 1 mg (1 mg Intravenous Given 07/12/15 1155)  morphine 4 MG/ML injection 4 mg (4 mg Intravenous  Given 07/12/15 1343)   Patient was given IV fluids along with IV Reglan, Ativan with resolution of nausea but with persistent epigastric pain.  He was given a dose of carafate and morphine with complete resolution of sx.  Pts labs stable, he was able to tolerate by mouth fluids are to discharge home.  Reexam unchanged with no guarding or rebound, benign abdomen at this time.  He was prescribed Percocet for pain relief, advised to continue using his Reglan and his home Carafate.  Advise follow-up with his GI specialist when necessary.    The patient appears reasonably screened and/or stabilized for discharge and I doubt any other medical condition or other [facility] requiring further screening, evaluation, or treatment in the ED at this time prior to discharge.     Evalee Jefferson, PA-C 07/12/15 [place], DO 07/15/15 [phone]

## 2015-07-23 ENCOUNTER — Emergency Department (HOSPITAL_COMMUNITY)
Admission: EM | Admit: 2015-07-23 | Discharge: 2015-07-23 | Disposition: A | Discharge status: Home / Self Care | Payer: Medicaid Other | Attending: Emergency Medicine | Admitting: Emergency Medicine

## 2015-07-23 ENCOUNTER — Encounter (HOSPITAL_COMMUNITY): Payer: Self-pay | Admitting: Emergency Medicine

## 2015-07-23 DIAGNOSIS — H578 Other specified disorders of eye and adnexa: Secondary | ICD-10-CM | POA: Diagnosis present

## 2015-07-23 DIAGNOSIS — M545 Low back pain: Secondary | ICD-10-CM | POA: Insufficient documentation

## 2015-07-23 DIAGNOSIS — R1013 Epigastric pain: Secondary | ICD-10-CM | POA: Diagnosis not present

## 2015-07-23 DIAGNOSIS — R61 Generalized hyperhidrosis: Secondary | ICD-10-CM | POA: Insufficient documentation

## 2015-07-23 DIAGNOSIS — Z794 Long term (current) use of insulin: Secondary | ICD-10-CM | POA: Diagnosis not present

## 2015-07-23 DIAGNOSIS — E1143 Type 2 diabetes mellitus with diabetic autonomic (poly)neuropathy: Secondary | ICD-10-CM | POA: Diagnosis not present

## 2015-07-23 DIAGNOSIS — Z79899 Other long term (current) drug therapy: Secondary | ICD-10-CM | POA: Insufficient documentation

## 2015-07-23 DIAGNOSIS — Z8619 Personal history of other infectious and parasitic diseases: Secondary | ICD-10-CM | POA: Diagnosis not present

## 2015-07-23 DIAGNOSIS — R112 Nausea with vomiting, unspecified: Secondary | ICD-10-CM | POA: Insufficient documentation

## 2015-07-23 DIAGNOSIS — H409 Unspecified glaucoma: Secondary | ICD-10-CM | POA: Insufficient documentation

## 2015-07-23 DIAGNOSIS — K219 Gastro-esophageal reflux disease without esophagitis: Secondary | ICD-10-CM | POA: Diagnosis not present

## 2015-07-23 DIAGNOSIS — G8929 Other chronic pain: Secondary | ICD-10-CM | POA: Insufficient documentation

## 2015-07-23 DIAGNOSIS — I10 Essential (primary) hypertension: Secondary | ICD-10-CM | POA: Diagnosis not present

## 2015-07-23 DIAGNOSIS — Z880 Allergy status to penicillin: Secondary | ICD-10-CM | POA: Insufficient documentation

## 2015-07-23 LAB — URINALYSIS, ROUTINE W REFLEX MICROSCOPIC
Bilirubin Urine: NEGATIVE
Glucose, UA: >1000 mg/dL — AB
Ketones, ur: NEGATIVE mg/dL
Leukocytes, UA: NEGATIVE
Nitrite: NEGATIVE
Protein, ur: 100 mg/dL — AB
Specific Gravity, Urine: >1.03 — ABNORMAL HIGH (ref 1.005–1.030)
pH: 5.5 (ref 5.0–8.0)

## 2015-07-23 LAB — COMPREHENSIVE METABOLIC PANEL
ALT: 18 U/L (ref 17–63)
AST: 22 U/L (ref 15–41)
Albumin: 3.7 g/dL (ref 3.5–5.0)
Alkaline Phosphatase: 52 U/L (ref 38–126)
Anion gap: 7 (ref 5–15)
BUN: 11 mg/dL (ref 6–20)
CO2: 25 mmol/L (ref 22–32)
Calcium: 9 mg/dL (ref 8.9–10.3)
Chloride: 103 mmol/L (ref 101–111)
Creatinine, Ser: 1.09 mg/dL (ref 0.61–1.24)
GFR calc Af Amer: >60 mL/min (ref >60)
GFR calc non Af Amer: >60 mL/min (ref >60)
Glucose, Bld: 253 mg/dL — ABNORMAL HIGH (ref 65–99)
Potassium: 3.9 mmol/L (ref 3.5–5.1)
Sodium: 135 mmol/L (ref 135–145)
Total Bilirubin: 0.7 mg/dL (ref 0.3–1.2)
Total Protein: 7.3 g/dL (ref 6.5–8.1)

## 2015-07-23 LAB — CBC WITH DIFFERENTIAL/PLATELET
Basophils Absolute: 0 10*3/uL (ref 0.0–0.1)
Basophils Relative: 0 %
Eosinophils Absolute: 0.1 10*3/uL (ref 0.0–0.7)
Eosinophils Relative: 1 %
HCT: 39.8 % (ref 39.0–52.0)
Hemoglobin: 14.2 g/dL (ref 13.0–17.0)
Lymphocytes Relative: 21 %
Lymphs Abs: 1.7 10*3/uL (ref 0.7–4.0)
MCH: 31.8 pg (ref 26.0–34.0)
MCHC: 35.7 g/dL (ref 30.0–36.0)
MCV: 89 fL (ref 78.0–100.0)
Monocytes Absolute: 0.5 10*3/uL (ref 0.1–1.0)
Monocytes Relative: 7 %
Neutro Abs: 5.7 10*3/uL (ref 1.7–7.7)
Neutrophils Relative %: 71 %
Platelets: 142 10*3/uL — ABNORMAL LOW (ref 150–400)
RBC: 4.47 MIL/uL (ref 4.22–5.81)
RDW: 12.7 % (ref 11.5–15.5)
WBC: 8 10*3/uL (ref 4.0–10.5)

## 2015-07-23 LAB — URINE MICROSCOPIC-ADD ON
Bacteria, UA: NONE SEEN
Squamous Epithelial / LPF: NONE SEEN
WBC, UA: NONE SEEN WBC/hpf (ref 0–5)

## 2015-07-23 LAB — LIPASE, BLOOD: Lipase: 32 U/L (ref 11–51)

## 2015-07-23 LAB — I-STAT CG4 LACTIC ACID, ED
Lactic Acid, Venous: 1.35 mmol/L (ref 0.5–2.0)
Lactic Acid, Venous: 0.95 mmol/L (ref 0.5–2.0)

## 2015-07-23 LAB — CBG MONITORING, ED: Glucose-Capillary: 219 mg/dL — ABNORMAL HIGH (ref 65–99)

## 2015-07-23 MED ORDER — LATANOPROST 0.005 % OP SOLN
1.0000 [drp] | OPHTHALMIC | Status: AC
  Administered 2015-07-23: 1 [drp] via OPHTHALMIC
  Filled 2015-07-23: qty 2.5

## 2015-07-23 MED ORDER — SODIUM CHLORIDE 0.9 % IV BOLUS (SEPSIS)
1000.0000 mL | Freq: Once | INTRAVENOUS | Status: AC
  Administered 2015-07-23: 1000 mL via INTRAVENOUS

## 2015-07-23 MED ORDER — TETRACAINE HCL 0.5 % OP SOLN
1.0000 [drp] | Freq: Once | OPHTHALMIC | Status: AC
  Administered 2015-07-23: 1 [drp] via OPHTHALMIC
  Filled 2015-07-23: qty 4

## 2015-07-23 MED ORDER — TIMOLOL MALEATE 0.5 % OP SOLN
1.0000 [drp] | Freq: Once | OPHTHALMIC | Status: AC
  Administered 2015-07-23: 1 [drp] via OPHTHALMIC
  Filled 2015-07-23: qty 5

## 2015-07-23 MED ORDER — PREDNISOLONE ACETATE 1 % OP SUSP
1.0000 [drp] | Freq: Once | OPHTHALMIC | Status: AC
  Administered 2015-07-23: 1 [drp] via OPHTHALMIC
  Filled 2015-07-23: qty 1

## 2015-07-23 MED ORDER — METOCLOPRAMIDE HCL 10 MG PO TABS: 10.0000 mg | ORAL_TABLET | Freq: Three times a day (TID) | ORAL | Status: DC | PRN

## 2015-07-23 MED ORDER — MANNITOL 20 % IV SOLN
1.0000 g/kg | Freq: Once | INTRAVENOUS | Status: AC
  Administered 2015-07-23: 117 g via INTRAVENOUS
  Filled 2015-07-23: qty 500

## 2015-07-23 MED ORDER — PILOCARPINE HCL 1 % OP SOLN
1.0000 [drp] | Freq: Once | OPHTHALMIC | Status: DC
  Filled 2015-07-23: qty 15

## 2015-07-23 MED ORDER — PROCHLORPERAZINE EDISYLATE 5 MG/ML IJ SOLN
10.0000 mg | Freq: Once | INTRAMUSCULAR | Status: AC
  Administered 2015-07-23: 10 mg via INTRAVENOUS
  Filled 2015-07-23: qty 2

## 2015-07-23 NOTE — ED Provider Notes (Signed)
CSN: [id number]     Arrival date & time 07/23/15  0904 History  By signing my name below, I, Tula Nakayama, attest that this documentation has been prepared under the direction and in the presence of Noemi Chapel, MD.  Electronically Signed: Tula Nakayama, ED Scribe. 07/23/2015. 10:12 AM.   Chief Complaint  Patient presents with  . Eye Problem  . Abdominal Pain  . Vomiting   The history is provided by the patient. No language interpreter was used.    HPI Comments: Timothy Clark is a 44 y.o. male with a history of diabetic gastroparesis, Type II DM and glaucoma who presents to the Emergency Department complaining of intermittent, moderate vomiting that started last night. Pt states tight, upper abdominal pain that radiates to his back and diaphoresis as associated symptoms. He tried Reglan, Protonix and Carafate this morning, but vomited shortly after treatment. Pt was diagnosed with gastroparesis 2 years ago and states current symptoms are consistent with prior exacerbations. He was last seen in the ED on 12/1 for similar symptoms. Pt has follow-up with GI in 3 weeks. He denies fever, difficulty urinating and diarrhea.  Pt also reports increased blurred vision in his left eye that was present this morning upon waking. 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 Pt tried applying eye drops this morning, but missed his eye. Pt has a history of glaucoma and placement of a stent in his left eye. He is able to drive, but did not drive today.  Past Medical History  Diagnosis Date  . Type 2 diabetes mellitus with complications ([place])     diagnosed around age 30  . Glaucoma   . Eye hemorrhage   . Gastritis   . GERD (gastroesophageal reflux disease)   . Esophagitis   . Hiccups   . Helicobacter pylori (H. pylori) infection   . Hypertension   . Gastroparesis   . Chronic abdominal pain   . Nausea and vomiting     chronic, recurrent   Past Surgical History  Procedure Laterality Date  .  Eye surgery    . Esophagogastroduodenoscopy  2015    Dr. Britta Mccreedy  . Esophagogastroduodenoscopy N/A 10/26/2014    RMR: Distal esophagititis-likely reflux related although an element of pill induced injuruy no exclueded  status post biopsy. Diffusely abnormal gastric mucosa of uncertain signigicane -status post gastric biopsy. Focal area of excoriation in the cardia most consistant with a trauma of heaving.   . Eye surgery     Family History  Problem Relation Age of Onset  . Ovarian cancer Mother   . Cervical cancer Sister   . Diabetes Sister   . Colon cancer Neg Hx    Social History  Substance Use Topics  . Smoking status: Never Smoker   . Smokeless tobacco: Never Used  . Alcohol Use: No   Review of Systems  Constitutional: Positive for diaphoresis. Negative for fever.  Eyes: Positive for visual disturbance.  Gastrointestinal: Positive for vomiting and abdominal pain. Negative for diarrhea.  Genitourinary: Negative for difficulty urinating.  Musculoskeletal: Positive for back pain.   Allergies  Donnatal; Fish allergy; Lidocaine; Maalox; Aspirin; Bactrim; Doxycycline; Penicillins; Phenergan; and Zofran  Home Medications   Prior to Admission medications   Medication Sig Start Date End Date Taking? Authorizing Provider  brimonidine-timolol (COMBIGAN) 0.2-0.5 % ophthalmic solution Place 1 drop into both eyes at bedtime.   Yes Historical Provider, MD  gabapentin (NEURONTIN) 600 MG tablet Take 600 mg by mouth daily  as needed (for neuropathy).    Yes Historical Provider, MD  hydroxypropyl methylcellulose / hypromellose (ISOPTO TEARS / GONIOVISC) 2.5 % ophthalmic solution Place 1 drop into both eyes as needed for dry eyes.   Yes Historical Provider, MD  insulin aspart protamine- aspart (NOVOLOG MIX 70/30) (70-30) 100 UNIT/ML injection Inject 0.15 mLs (15 Units total) into the skin 3 (three) times daily. Patient taking differently: Inject 35 Units into the skin 3 (three) times daily.   02/20/15  Yes Orvan Falconer, MD  insulin detemir (LEVEMIR) 100 UNIT/ML injection Inject 30 Units into the skin at bedtime.   Yes Historical Provider, MD  lisinopril-hydrochlorothiazide (PRINZIDE,ZESTORETIC) 20-25 MG tablet Take 1 tablet by mouth daily.   Yes Historical Provider, MD  pantoprazole (PROTONIX) 40 MG tablet Take 1 tablet (40 mg total) by mouth 2 (two) times daily before a meal. 06/14/15  Yes Mahala Menghini, PA-C  sucralfate (CARAFATE) 1 G tablet Take 1 tablet (1 g total) by mouth 4 (four) times daily. 04/20/15  Yes Mahala Menghini, PA-C  traMADol (ULTRAM) 50 MG tablet Take 1 tablet (50 mg total) by mouth every 6 (six) hours as needed for moderate pain. 06/07/15  Yes Forde Dandy, MD  HYDROcodone-acetaminophen (NORCO/VICODIN) 5-325 MG tablet 1 or 2 tabs PO q6 hours prn pain Patient not taking: Reported on 07/12/2015 07/06/15   Francine Graven, DO  metoCLOPramide (REGLAN) 10 MG tablet Take 1 tablet (10 mg total) by mouth 3 (three) times daily as needed for nausea (headache / nausea). 07/23/15   Noemi Chapel, MD  oxyCODONE-acetaminophen (PERCOCET/ROXICET) 5-325 MG tablet Take 1 tablet by mouth every 4 (four) hours as needed. Patient not taking: Reported on 07/23/2015 07/12/15   Evalee Jefferson, PA-C   BP 155/102 mmHg  Pulse 82  Temp(Src) 98.2 F (36.8 C) (Oral)  Resp 14  Ht 6' (1.829 m)  Wt 258 lb (117.028 kg)  BMI 34.98 kg/m2  SpO2 95% Physical Exam  Constitutional: He appears well-developed and well-nourished. No distress.  HENT:  Head: Normocephalic and atraumatic.  Mouth/Throat: Oropharynx is clear and moist. No oropharyngeal exudate.  Eyes: Conjunctivae are normal. Right eye exhibits no discharge. Left eye exhibits no discharge. No scleral icterus.  No vision out of left eye, deviated laterally Vision is severely reduced out of right eye Pupil is minimally reactive Conjunctiva is clear Normal EOM on the right 20/200 in the right eye Intraocular pressure averaged 42 over 3 attempts   Neck: Normal range of motion. Neck supple. No JVD present. No thyromegaly present.  Cardiovascular: Normal rate, regular rhythm, normal heart sounds and intact distal pulses.  Exam reveals no gallop and no friction rub.   No murmur heard. Pulmonary/Chest: Effort normal and breath sounds normal. No respiratory distress. He has no wheezes. He has no rales.  Abdominal: Soft. Bowel sounds are normal. He exhibits no distension and no mass. There is tenderness (Epigastrium). There is no guarding.  Musculoskeletal: Normal range of motion. He exhibits no edema or tenderness.  Lymphadenopathy:    He has no cervical adenopathy.  Neurological: He is alert. Coordination normal.  Neurologic exam:  Pupil minimally reactive on the R extraocular movements intact on the R Normal peripheral visual fields  no facial droop Follows commands, moves all extremities x4, normal strength to bilateral upper and lower extremities at all major muscle groups including grip Sensation normal to light touch and pinprick Coordination intact, no limb ataxia, finger-nose-finger normal Rapid alternating movements normal No pronator drift Gait normal  normal  except for vision  Skin: Skin is warm and dry. No rash noted. No erythema.  Psychiatric: He has a normal mood and affect. His behavior is normal.  Nursing note and vitals reviewed.  ED Course  Procedures  DIAGNOSTIC STUDIES: Oxygen Saturation is 100% on RA, normal by my interpretation.    COORDINATION OF CARE: 10:07 AM Discussed treatment plan with pt which includes lab work and measurement of intraocular pressure. He agreed to plan.  10:50 AM Ophthalmology paged.  11:09 AM Dr. Yolanda Bonine recommended no pilocarpine, but to add xalatan and mannitol.  Labs Review Labs Reviewed  CBC WITH DIFFERENTIAL/PLATELET - Abnormal; Notable for the following:    Platelets 142 (*)    All other components within normal limits  COMPREHENSIVE METABOLIC PANEL - Abnormal; Notable  for the following:    Glucose, Bld 253 (*)    All other components within normal limits  URINALYSIS, ROUTINE W REFLEX MICROSCOPIC (NOT AT [facility]) - Abnormal; Notable for the following:    Specific Gravity, Urine >1.030 (*)    Glucose, UA >1000 (*)    Hgb urine dipstick MODERATE (*)    Protein, ur 100 (*)    All other components within normal limits  CBG MONITORING, ED - Abnormal; Notable for the following:    Glucose-Capillary 219 (*)    All other components within normal limits  LIPASE, BLOOD  URINE MICROSCOPIC-ADD ON  I-STAT CG4 LACTIC ACID, ED  I-STAT CG4 LACTIC ACID, ED    Imaging Review No results found. I have personally reviewed and evaluated these lab results as part of my medical decision-making.  The pt has has ongoing nausea which gradually eased off - he now has normal HR, normal temp, normal oxygenation - he has been hypertensive throughout (mild).  He has had improvement in her IOP with measurements as high as 45 on arrival and 30 after meds given as below - I d/w Dr. Iona Hansen and he made recommendations - the pt wants to do PO trial - states that he feels better and has no more nausea.    Labs reviewed, no signs of DKA.    reconsult optho for final recommendations. 20/50 in the R eye after meds [facility] in [place] is primary Optho. Dr. Iona Hansen now reccomends f/u with optho tomorrow - is willing to see him if he can't be seen at [facility] optho.  Pt in agreement.  MDM   Final diagnoses:  Acute glaucoma of right eye  Non-intractable vomiting with nausea, vomiting of unspecified type    I personally performed the services described in this documentation, which was scribed in my presence. The recorded information has been reviewed and is accurate.        Noemi Chapel, MD 07/23/15 [phone]

## 2015-07-23 NOTE — ED Notes (Addendum)
During triage pt very hostile when asked about reason for coming to ED today.  Explain that ED providers will be glad to provide care but I was asking what was new from his previous visit.  Pt says, "Will I want come here any more."  Reassure pt that we would be happy to see him, that I was asking what was new and pt says he has been vomiting, Emesis added to chief complaints.

## 2015-07-23 NOTE — ED Notes (Addendum)
C/o abdominal pain and vomiting, rates pain 10/10.  Vomited about 5 times lat 24 hours, pt says it foam.  Have any appointment Jan 4th 2017 and PCP Dec. 14 th. Woke up this am with right eye fogged over.  History of blindness to left eye.   Pt very hostile during triage when asked what brought him to ED today.

## 2015-08-07 ENCOUNTER — Encounter (HOSPITAL_COMMUNITY): Payer: Self-pay | Admitting: *Deleted

## 2015-08-07 ENCOUNTER — Emergency Department (HOSPITAL_COMMUNITY)
Admission: EM | Admit: 2015-08-07 | Discharge: 2015-08-07 | Disposition: A | Discharge status: Home / Self Care | Payer: Medicaid Other | Attending: Emergency Medicine | Admitting: Emergency Medicine

## 2015-08-07 DIAGNOSIS — K3184 Gastroparesis: Secondary | ICD-10-CM | POA: Insufficient documentation

## 2015-08-07 DIAGNOSIS — Z8619 Personal history of other infectious and parasitic diseases: Secondary | ICD-10-CM | POA: Insufficient documentation

## 2015-08-07 DIAGNOSIS — H409 Unspecified glaucoma: Secondary | ICD-10-CM | POA: Insufficient documentation

## 2015-08-07 DIAGNOSIS — Z792 Long term (current) use of antibiotics: Secondary | ICD-10-CM | POA: Diagnosis not present

## 2015-08-07 DIAGNOSIS — Z794 Long term (current) use of insulin: Secondary | ICD-10-CM | POA: Diagnosis not present

## 2015-08-07 DIAGNOSIS — Z880 Allergy status to penicillin: Secondary | ICD-10-CM | POA: Diagnosis not present

## 2015-08-07 DIAGNOSIS — T434X5A Adverse effect of butyrophenone and thiothixene neuroleptics, initial encounter: Secondary | ICD-10-CM | POA: Diagnosis not present

## 2015-08-07 DIAGNOSIS — E119 Type 2 diabetes mellitus without complications: Secondary | ICD-10-CM | POA: Diagnosis not present

## 2015-08-07 DIAGNOSIS — I10 Essential (primary) hypertension: Secondary | ICD-10-CM | POA: Insufficient documentation

## 2015-08-07 DIAGNOSIS — R1013 Epigastric pain: Secondary | ICD-10-CM | POA: Diagnosis present

## 2015-08-07 DIAGNOSIS — Z79899 Other long term (current) drug therapy: Secondary | ICD-10-CM | POA: Diagnosis not present

## 2015-08-07 DIAGNOSIS — K219 Gastro-esophageal reflux disease without esophagitis: Secondary | ICD-10-CM | POA: Insufficient documentation

## 2015-08-07 DIAGNOSIS — G8929 Other chronic pain: Secondary | ICD-10-CM | POA: Diagnosis not present

## 2015-08-07 DIAGNOSIS — G2571 Drug induced akathisia: Secondary | ICD-10-CM | POA: Diagnosis not present

## 2015-08-07 LAB — COMPREHENSIVE METABOLIC PANEL
ALT: 18 U/L (ref 17–63)
AST: 21 U/L (ref 15–41)
Albumin: 4.1 g/dL (ref 3.5–5.0)
Alkaline Phosphatase: 46 U/L (ref 38–126)
Anion gap: 9 (ref 5–15)
BUN: 15 mg/dL (ref 6–20)
CO2: 27 mmol/L (ref 22–32)
Calcium: 9.4 mg/dL (ref 8.9–10.3)
Chloride: 101 mmol/L (ref 101–111)
Creatinine, Ser: 1.17 mg/dL (ref 0.61–1.24)
GFR calc Af Amer: >60 mL/min (ref >60)
GFR calc non Af Amer: >60 mL/min (ref >60)
Glucose, Bld: 182 mg/dL — ABNORMAL HIGH (ref 65–99)
Potassium: 3.6 mmol/L (ref 3.5–5.1)
Sodium: 137 mmol/L (ref 135–145)
Total Bilirubin: 0.8 mg/dL (ref 0.3–1.2)
Total Protein: 7.2 g/dL (ref 6.5–8.1)

## 2015-08-07 LAB — URINALYSIS, ROUTINE W REFLEX MICROSCOPIC
Bilirubin Urine: NEGATIVE
Glucose, UA: >1000 mg/dL — AB
Ketones, ur: 15 mg/dL — AB
Leukocytes, UA: NEGATIVE
Nitrite: NEGATIVE
Protein, ur: 100 mg/dL — AB
Specific Gravity, Urine: 1.015 (ref 1.005–1.030)
pH: 6 (ref 5.0–8.0)

## 2015-08-07 LAB — CBC
HCT: 41 % (ref 39.0–52.0)
Hemoglobin: 14 g/dL (ref 13.0–17.0)
MCH: 30.4 pg (ref 26.0–34.0)
MCHC: 34.1 g/dL (ref 30.0–36.0)
MCV: 88.9 fL (ref 78.0–100.0)
Platelets: 186 10*3/uL (ref 150–400)
RBC: 4.61 MIL/uL (ref 4.22–5.81)
RDW: 12.9 % (ref 11.5–15.5)
WBC: 8.2 10*3/uL (ref 4.0–10.5)

## 2015-08-07 LAB — URINE MICROSCOPIC-ADD ON

## 2015-08-07 LAB — LIPASE, BLOOD: Lipase: 20 U/L (ref 11–51)

## 2015-08-07 MED ORDER — DIPHENHYDRAMINE HCL 25 MG PO CAPS
25.0000 mg | ORAL_CAPSULE | Freq: Once | ORAL | Status: AC
  Administered 2015-08-07: 25 mg via ORAL

## 2015-08-07 MED ORDER — LORAZEPAM 2 MG/ML IJ SOLN
INTRAMUSCULAR | Status: AC
  Administered 2015-08-07: 2 mg
  Filled 2015-08-07: qty 1

## 2015-08-07 MED ORDER — DIPHENHYDRAMINE HCL 25 MG PO CAPS
ORAL_CAPSULE | ORAL | Status: AC
  Filled 2015-08-07: qty 1

## 2015-08-07 MED ORDER — DIPHENHYDRAMINE HCL 50 MG/ML IJ SOLN
25.0000 mg | Freq: Once | INTRAMUSCULAR | Status: DC
Start: 2015-08-07 — End: 2015-08-07

## 2015-08-07 MED ORDER — DIPHENHYDRAMINE HCL 50 MG/ML IJ SOLN
25.0000 mg | Freq: Once | INTRAMUSCULAR | Status: AC
  Administered 2015-08-07: 25 mg via INTRAVENOUS
  Filled 2015-08-07: qty 1

## 2015-08-07 MED ORDER — PANTOPRAZOLE SODIUM 40 MG IV SOLR
40.0000 mg | Freq: Once | INTRAVENOUS | Status: AC
  Administered 2015-08-07: 40 mg via INTRAVENOUS
  Filled 2015-08-07: qty 40

## 2015-08-07 MED ORDER — SODIUM CHLORIDE 0.9 % IV BOLUS (SEPSIS)
1000.0000 mL | Freq: Once | INTRAVENOUS | Status: AC
  Administered 2015-08-07: 1000 mL via INTRAVENOUS

## 2015-08-07 MED ORDER — HALOPERIDOL LACTATE 5 MG/ML IJ SOLN
2.5000 mg | Freq: Once | INTRAMUSCULAR | Status: AC
  Administered 2015-08-07: 2.5 mg via INTRAVENOUS
  Filled 2015-08-07: qty 1

## 2015-08-07 MED ORDER — METOCLOPRAMIDE HCL 5 MG/ML IJ SOLN
10.0000 mg | Freq: Once | INTRAMUSCULAR | Status: AC
  Administered 2015-08-07: 10 mg via INTRAVENOUS
  Filled 2015-08-07: qty 2

## 2015-08-07 NOTE — ED Notes (Signed)
Pt. c/o abd pain with n/v. Reports hx of gastroparesis.

## 2015-08-07 NOTE — ED Provider Notes (Signed)
CSN: [id number]     Arrival date & time 08/07/15  1117 History   First MD Initiated Contact with Patient 08/07/15 1447     Chief Complaint  Patient presents with  . Abdominal Pain      HPI  Central evaluation of "my gastroparesis is acting up again". Describes central epigastric abdominal pain with vomiting starting last night and persisting through this morning. Of note he had a recent flare of his glaucoma require ER treatment on the 12th. His been using his drops and compliant. He states his vision is at its baseline.  Heme-negative nonbilious emesis. Last bowel movement yesterday. No history of abdominal surgeries. No fevers no chills.  Past Medical History  Diagnosis Date  . Type 2 diabetes mellitus with complications ([place])     diagnosed around age 31  . Glaucoma   . Eye hemorrhage   . Gastritis   . GERD (gastroesophageal reflux disease)   . Esophagitis   . Hiccups   . Helicobacter pylori (H. pylori) infection   . Hypertension   . Gastroparesis   . Chronic abdominal pain   . Nausea and vomiting     chronic, recurrent   Past Surgical History  Procedure Laterality Date  . Eye surgery    . Esophagogastroduodenoscopy  2015    Dr. Britta Mccreedy  . Esophagogastroduodenoscopy N/A 10/26/2014    RMR: Distal esophagititis-likely reflux related although an element of pill induced injuruy no exclueded  status post biopsy. Diffusely abnormal gastric mucosa of uncertain signigicane -status post gastric biopsy. Focal area of excoriation in the cardia most consistant with a trauma of heaving.   . Eye surgery     Family History  Problem Relation Age of Onset  . Ovarian cancer Mother   . Cervical cancer Sister   . Diabetes Sister   . Colon cancer Neg Hx    Social History  Substance Use Topics  . Smoking status: Never Smoker   . Smokeless tobacco: Never Used  . Alcohol Use: No    Review of Systems  Constitutional: Negative for fever, chills, diaphoresis, appetite change and  fatigue.  HENT: Negative for mouth sores, sore throat and trouble swallowing.   Eyes: Negative for visual disturbance.  Respiratory: Negative for cough, chest tightness, shortness of breath and wheezing.   Cardiovascular: Negative for chest pain.  Gastrointestinal: Positive for nausea, vomiting and abdominal pain. Negative for diarrhea and abdominal distention.  Endocrine: Negative for polydipsia, polyphagia and polyuria.  Genitourinary: Negative for dysuria, frequency and hematuria.  Musculoskeletal: Negative for gait problem.  Skin: Negative for color change, pallor and rash.  Neurological: Negative for dizziness, syncope, light-headedness and headaches.  Hematological: Does not bruise/bleed easily.  Psychiatric/Behavioral: Negative for behavioral problems and confusion.      Allergies  Donnatal; Fish allergy; Haldol; Lidocaine; Maalox; Aspirin; Bactrim; Doxycycline; Penicillins; Phenergan; and Zofran  Home Medications   Prior to Admission medications   Medication Sig Start Date End Date Taking? Authorizing Provider  brimonidine-timolol (COMBIGAN) 0.2-0.5 % ophthalmic solution Place 1 drop into both eyes at bedtime.   Yes Historical Provider, MD  erythromycin (E-MYCIN) 250 MG tablet Take 250 mg by mouth daily.   Yes Historical Provider, MD  gabapentin (NEURONTIN) 600 MG tablet Take 600 mg by mouth daily as needed (for neuropathy).    Yes Historical Provider, MD  hydroxypropyl methylcellulose / hypromellose (ISOPTO TEARS / GONIOVISC) 2.5 % ophthalmic solution Place 1 drop into both eyes as needed for dry eyes.   Yes Historical Provider,  MD  insulin aspart protamine- aspart (NOVOLOG MIX 70/30) (70-30) 100 UNIT/ML injection Inject 0.15 mLs (15 Units total) into the skin 3 (three) times daily. Patient taking differently: Inject 35 Units into the skin 3 (three) times daily.  02/20/15  Yes Orvan Falconer, MD  insulin detemir (LEVEMIR) 100 UNIT/ML injection Inject 30 Units into the skin at bedtime.    Yes Historical Provider, MD  lisinopril-hydrochlorothiazide (PRINZIDE,ZESTORETIC) 20-25 MG tablet Take 1 tablet by mouth daily.   Yes Historical Provider, MD  metoCLOPramide (REGLAN) 10 MG tablet Take 1 tablet (10 mg total) by mouth 3 (three) times daily as needed for nausea (headache / nausea). 07/23/15  Yes Noemi Chapel, MD  pantoprazole (PROTONIX) 40 MG tablet Take 1 tablet (40 mg total) by mouth 2 (two) times daily before a meal. 06/14/15  Yes Mahala Menghini, PA-C  sucralfate (CARAFATE) 1 G tablet Take 1 tablet (1 g total) by mouth 4 (four) times daily. 04/20/15  Yes Mahala Menghini, PA-C  HYDROcodone-acetaminophen (NORCO/VICODIN) 5-325 MG tablet 1 or 2 tabs PO q6 hours prn pain Patient not taking: Reported on 07/12/2015 07/06/15   Francine Graven, DO  oxyCODONE-acetaminophen (PERCOCET/ROXICET) 5-325 MG tablet Take 1 tablet by mouth every 4 (four) hours as needed. Patient not taking: Reported on 07/23/2015 07/12/15   Evalee Jefferson, PA-C  traMADol (ULTRAM) 50 MG tablet Take 1 tablet (50 mg total) by mouth every 6 (six) hours as needed for moderate pain. Patient not taking: Reported on 08/07/2015 06/07/15   Forde Dandy, MD   BP 147/84 mmHg  Pulse 100  Temp(Src) 98.3 F (36.8 C) (Oral)  Resp 13  Ht 6' (1.829 m)  Wt 268 lb (121.564 kg)  BMI 36.34 kg/m2  SpO2 97% Physical Exam  Constitutional: He is oriented to person, place, and time. He appears well-developed and well-nourished. No distress.  HENT:  Head: Normocephalic.  Eyes: Conjunctivae are normal. No scleral icterus.  Left eye with lateral deviation. States no vision from the left eye. Can see light, colors, and read with his right eye. Normal reactive right pupil.  Neck: Normal range of motion. Neck supple. No thyromegaly present.  Cardiovascular: Normal rate and regular rhythm.  Exam reveals no gallop and no friction rub.   No murmur heard. Pulmonary/Chest: Effort normal and breath sounds normal. No respiratory distress. He has no  wheezes. He has no rales.  Abdominal: Soft. Bowel sounds are normal. He exhibits no distension. There is no tenderness. There is no rebound.  Abdomen soft. No apparent tenderness on exam. Intermittently complains of cramping abdominal pain that "double me over".  Musculoskeletal: Normal range of motion.  Neurological: He is alert and oriented to person, place, and time.  Skin: Skin is warm and dry. No rash noted.  Psychiatric: He has a normal mood and affect. His behavior is normal.    ED Course  Procedures (including critical care time) Labs Review Labs Reviewed  COMPREHENSIVE METABOLIC PANEL - Abnormal; Notable for the following:    Glucose, Bld 182 (*)    All other components within normal limits  URINALYSIS, ROUTINE W REFLEX MICROSCOPIC (NOT AT [facility]) - Abnormal; Notable for the following:    Glucose, UA >1000 (*)    Hgb urine dipstick MODERATE (*)    Ketones, ur 15 (*)    Protein, ur 100 (*)    All other components within normal limits  URINE MICROSCOPIC-ADD ON - Abnormal; Notable for the following:    Squamous Epithelial / LPF 0-5 (*)  Bacteria, UA RARE (*)    All other components within normal limits  LIPASE, BLOOD  CBC    Imaging Review No results found. I have personally reviewed and evaluated these images and lab results as part of my medical decision-making.   EKG Interpretation None      MDM   Final diagnoses:  Drug induced akathisia  Gastroparesis    Patient given Haldol 2.5 mg IV. Promptly had akathisia reaction with feeling of doom and desire to "have to get up and walk around as well as anxiety. Given additional Benadryl. His symptoms did improve. He struck his nausea was "much much better and he was feeling "fine" other than some mild continued anxiety. Given additional Benadryl. He is appropriate for discharge.    Tanna Furry, MD 08/07/15 [phone]

## 2015-08-07 NOTE — ED Notes (Addendum)
MD at bedside. New orders give.

## 2015-08-07 NOTE — Discharge Instructions (Signed)
Gastroparesis °Gastroparesis, also called delayed gastric emptying, is a condition in which food takes longer than normal to empty from the stomach. The condition is usually long-lasting (chronic). °CAUSES °This condition may be caused by: °· An endocrine disorder, such as hypothyroidism or diabetes. Diabetes is the most common cause of this condition. °· A nervous system disease, such as Parkinson disease or multiple sclerosis. °· Cancer, infection, or surgery of the stomach or vagus nerve. °· A connective tissue disorder, such as scleroderma. °· Certain medicines. °In most cases, the cause is not known. °RISK FACTORS °This condition is more likely to develop in: °· People with certain disorders, including endocrine disorders, eating disorders, amyloidosis, and scleroderma. °· People with certain diseases, including Parkinson disease or multiple sclerosis. °· People with cancer or infection of the stomach or vagus nerve. °· People who have had surgery on the stomach or vagus nerve. °· People who take certain medicines. °· Women. °SYMPTOMS °Symptoms of this condition include: °· An early feeling of fullness when eating. °· Nausea. °· Weight loss. °· Vomiting. °· Heartburn. °· Abdominal bloating. °· Inconsistent blood glucose levels. °· Lack of appetite. °· Acid from the stomach coming up into the esophagus (gastroesophageal reflux). °· Spasms of the stomach. °Symptoms may come and go. °DIAGNOSIS °This condition is diagnosed with tests, such as: °· Tests that check how long it takes food to move through the stomach and intestines. These tests include: °¨ Upper gastrointestinal (GI) series. In this test, X-rays of the intestines are taken after you drink a liquid. The liquid makes the intestines show up better on the X-rays. °¨ Gastric emptying scintigraphy. In this test, scans are taken after you eat food that contains a small amount of radioactive material. °¨ Wireless capsule GI monitoring system. This test  involves swallowing a capsule that records information about movement through the stomach. °· Gastric manometry. This test measures electrical and muscular activity in the stomach. It is done with a thin tube that is passed down the throat and into the stomach. °· Endoscopy. This test checks for abnormalities in the lining of the stomach. It is done with a long, thin tube that is passed down the throat and into the stomach. °· An ultrasound. This test can help rule out gallbladder disease or pancreatitis as a cause of your symptoms. It uses sound waves to take pictures of the inside of your body. °TREATMENT °There is no cure for gastroparesis. This condition may be managed with: °· Treatment of the underlying condition causing the gastroparesis. °· Lifestyle changes, including exercise and dietary changes. Dietary changes can include: °¨ Changes in what and when you eat. °¨ Eating smaller meals more often. °¨ Eating low-fat foods. °¨ Eating low-fiber forms of high-fiber foods, such as cooked vegetables instead of raw vegetables. °¨ Having liquid foods in place of solid foods. Liquid foods are easier to digest. °· Medicines. These may be given to control nausea and vomiting and to stimulate stomach muscles. °· Getting food through a feeding tube. This may be done in severe cases. °· A gastric neurostimulator. This is a device that is inserted into the body with surgery. It helps improve stomach emptying and control nausea and vomiting. °HOME CARE INSTRUCTIONS °· Follow your health care provider's instructions about exercise and diet. °· Take medicines only as directed by your health care provider. °SEEK MEDICAL CARE IF: °· Your symptoms do not improve with treatment. °· You have new symptoms. °SEEK IMMEDIATE MEDICAL CARE IF: °· You have   severe abdominal pain that does not improve with treatment. °· You have nausea that does not go away. °· You cannot keep fluids down. °  °This information is not intended to replace  advice given to you by your health care provider. Make sure you discuss any questions you have with your health care provider. °  °Document Released: 07/28/2005 Document Revised: 12/12/2014 Document Reviewed: 07/24/2014 °Elsevier Interactive Patient Education ©2016 [employer] ° °

## 2015-08-07 NOTE — ED Notes (Signed)
Post administration of Haldol patient complained of sudden onset of chest pain. Patient reported "I can't breathe." Patient placed on 2 LPM of oxygen via [place]. O2 sat 100%. Dr. Jeneen Rinks made aware.

## 2015-08-07 NOTE — ED Notes (Signed)
Patient reports of "feeling better." Denies respiratory complaints.

## 2015-08-09 ENCOUNTER — Emergency Department (HOSPITAL_COMMUNITY)
Admission: EM | Admit: 2015-08-09 | Discharge: 2015-08-09 | Disposition: A | Discharge status: Home / Self Care | Payer: Medicaid Other | Attending: Emergency Medicine | Admitting: Emergency Medicine

## 2015-08-09 ENCOUNTER — Encounter (HOSPITAL_COMMUNITY): Payer: Self-pay | Admitting: Emergency Medicine

## 2015-08-09 DIAGNOSIS — Z792 Long term (current) use of antibiotics: Secondary | ICD-10-CM | POA: Diagnosis not present

## 2015-08-09 DIAGNOSIS — Z794 Long term (current) use of insulin: Secondary | ICD-10-CM | POA: Diagnosis not present

## 2015-08-09 DIAGNOSIS — K219 Gastro-esophageal reflux disease without esophagitis: Secondary | ICD-10-CM | POA: Diagnosis not present

## 2015-08-09 DIAGNOSIS — G8929 Other chronic pain: Secondary | ICD-10-CM | POA: Diagnosis not present

## 2015-08-09 DIAGNOSIS — E119 Type 2 diabetes mellitus without complications: Secondary | ICD-10-CM | POA: Diagnosis not present

## 2015-08-09 DIAGNOSIS — Z880 Allergy status to penicillin: Secondary | ICD-10-CM | POA: Diagnosis not present

## 2015-08-09 DIAGNOSIS — Z8619 Personal history of other infectious and parasitic diseases: Secondary | ICD-10-CM | POA: Diagnosis not present

## 2015-08-09 DIAGNOSIS — R1013 Epigastric pain: Secondary | ICD-10-CM | POA: Diagnosis present

## 2015-08-09 DIAGNOSIS — H409 Unspecified glaucoma: Secondary | ICD-10-CM | POA: Diagnosis not present

## 2015-08-09 DIAGNOSIS — I10 Essential (primary) hypertension: Secondary | ICD-10-CM | POA: Insufficient documentation

## 2015-08-09 DIAGNOSIS — K3184 Gastroparesis: Secondary | ICD-10-CM | POA: Diagnosis not present

## 2015-08-09 DIAGNOSIS — Z79899 Other long term (current) drug therapy: Secondary | ICD-10-CM | POA: Insufficient documentation

## 2015-08-09 LAB — CBC WITH DIFFERENTIAL/PLATELET

## 2015-08-09 LAB — COMPREHENSIVE METABOLIC PANEL
ALT: 19 U/L (ref 17–63)
AST: 21 U/L (ref 15–41)
Albumin: 4.4 g/dL (ref 3.5–5.0)
Alkaline Phosphatase: 43 U/L (ref 38–126)
Anion gap: 9 (ref 5–15)
BUN: 12 mg/dL (ref 6–20)
CO2: 27 mmol/L (ref 22–32)
Calcium: 9.6 mg/dL (ref 8.9–10.3)
Chloride: 102 mmol/L (ref 101–111)
Creatinine, Ser: 1.09 mg/dL (ref 0.61–1.24)
GFR calc Af Amer: >60 mL/min (ref >60)
GFR calc non Af Amer: >60 mL/min (ref >60)
Glucose, Bld: 141 mg/dL — ABNORMAL HIGH (ref 65–99)
Potassium: 3.7 mmol/L (ref 3.5–5.1)
Sodium: 138 mmol/L (ref 135–145)
Total Bilirubin: 0.9 mg/dL (ref 0.3–1.2)
Total Protein: 7.3 g/dL (ref 6.5–8.1)

## 2015-08-09 LAB — CBC
HCT: 42 % (ref 39.0–52.0)
Hemoglobin: 14.9 g/dL (ref 13.0–17.0)
MCH: 31.7 pg (ref 26.0–34.0)
MCHC: 35.5 g/dL (ref 30.0–36.0)
MCV: 89.4 fL (ref 78.0–100.0)
Platelets: 188 10*3/uL (ref 150–400)
RBC: 4.7 MIL/uL (ref 4.22–5.81)
RDW: 12.8 % (ref 11.5–15.5)
WBC: 8.7 10*3/uL (ref 4.0–10.5)

## 2015-08-09 LAB — DIFFERENTIAL
Basophils Absolute: 0 10*3/uL (ref 0.0–0.1)
Basophils Relative: 0 %
Eosinophils Absolute: 0 10*3/uL (ref 0.0–0.7)
Eosinophils Relative: 0 %
Lymphocytes Relative: 26 %
Lymphs Abs: 2.1 10*3/uL (ref 0.7–4.0)
Monocytes Absolute: 0.4 10*3/uL (ref 0.1–1.0)
Monocytes Relative: 5 %
Neutro Abs: 5.6 10*3/uL (ref 1.7–7.7)
Neutrophils Relative %: 69 %

## 2015-08-09 LAB — LIPASE, BLOOD: Lipase: 23 U/L (ref 11–51)

## 2015-08-09 MED ORDER — HYDROMORPHONE HCL 1 MG/ML IJ SOLN
0.5000 mg | Freq: Once | INTRAMUSCULAR | Status: AC
  Administered 2015-08-09: 0.5 mg via INTRAVENOUS
  Filled 2015-08-09: qty 1

## 2015-08-09 MED ORDER — SODIUM CHLORIDE 0.9 % IV BOLUS (SEPSIS)
1000.0000 mL | Freq: Once | INTRAVENOUS | Status: AC
  Administered 2015-08-09: 1000 mL via INTRAVENOUS

## 2015-08-09 MED ORDER — METOCLOPRAMIDE HCL 5 MG/ML IJ SOLN
10.0000 mg | Freq: Once | INTRAMUSCULAR | Status: AC
  Administered 2015-08-09: 10 mg via INTRAVENOUS
  Filled 2015-08-09: qty 2

## 2015-08-09 NOTE — ED Notes (Signed)
PT c/o epigastric abdominal pain with nausea and vomiting x3 days and states has a GI appointment at [facility] on 08/14/14.

## 2015-08-09 NOTE — Discharge Instructions (Signed)
Follow up with your gi md as planned

## 2015-08-09 NOTE — ED Provider Notes (Signed)
CSN: [id number]     Arrival date & time 08/09/15  1320 History   First MD Initiated Contact with Patient 08/09/15 1552     Chief Complaint  Patient presents with  . Abdominal Pain     (Consider location/radiation/quality/duration/timing/severity/associated sxs/prior Treatment) Patient is a 44 y.o. male presenting with abdominal pain. The history is provided by the patient (The patient complains of epigastric discomfort and vomiting. He states that his gastroparesis is acting up.).  Abdominal Pain Pain location:  Epigastric Pain quality: aching   Pain radiates to:  Does not radiate Pain severity:  Moderate Onset quality:  Sudden Timing:  Constant Progression:  Waxing and waning Chronicity:  Recurrent Context: not alcohol use   Associated symptoms: no chest pain, no cough, no diarrhea, no fatigue and no hematuria     Past Medical History  Diagnosis Date  . Type 2 diabetes mellitus with complications ([place])     diagnosed around age 72  . Glaucoma   . Eye hemorrhage   . Gastritis   . GERD (gastroesophageal reflux disease)   . Esophagitis   . Hiccups   . Helicobacter pylori (H. pylori) infection   . Hypertension   . Gastroparesis   . Chronic abdominal pain   . Nausea and vomiting     chronic, recurrent   Past Surgical History  Procedure Laterality Date  . Eye surgery    . Esophagogastroduodenoscopy  2015    Dr. Britta Mccreedy  . Esophagogastroduodenoscopy N/A 10/26/2014    RMR: Distal esophagititis-likely reflux related although an element of pill induced injuruy no exclueded  status post biopsy. Diffusely abnormal gastric mucosa of uncertain signigicane -status post gastric biopsy. Focal area of excoriation in the cardia most consistant with a trauma of heaving.   . Eye surgery     Family History  Problem Relation Age of Onset  . Ovarian cancer Mother   . Cervical cancer Sister   . Diabetes Sister   . Colon cancer Neg Hx    Social History  Substance Use Topics  . Smoking  status: Never Smoker   . Smokeless tobacco: Never Used  . Alcohol Use: No    Review of Systems  Constitutional: Negative for appetite change and fatigue.  HENT: Negative for congestion, ear discharge and sinus pressure.   Eyes: Negative for discharge.  Respiratory: Negative for cough.   Cardiovascular: Negative for chest pain.  Gastrointestinal: Positive for abdominal pain. Negative for diarrhea.  Genitourinary: Negative for frequency and hematuria.  Musculoskeletal: Negative for back pain.  Skin: Negative for rash.  Neurological: Negative for seizures and headaches.  Psychiatric/Behavioral: Negative for hallucinations.      Allergies  Donnatal; Fish allergy; Haldol; Lidocaine; Maalox; Aspirin; Bactrim; Doxycycline; Penicillins; Phenergan; and Zofran  Home Medications   Prior to Admission medications   Medication Sig Start Date End Date Taking? Authorizing Provider  brimonidine-timolol (COMBIGAN) 0.2-0.5 % ophthalmic solution Place 1 drop into both eyes at bedtime.   Yes Historical Provider, MD  erythromycin (E-MYCIN) 250 MG tablet Take 250 mg by mouth daily.   Yes Historical Provider, MD  gabapentin (NEURONTIN) 600 MG tablet Take 600 mg by mouth daily as needed (for neuropathy).    Yes Historical Provider, MD  hydroxypropyl methylcellulose / hypromellose (ISOPTO TEARS / GONIOVISC) 2.5 % ophthalmic solution Place 1 drop into both eyes as needed for dry eyes.   Yes Historical Provider, MD  insulin aspart protamine- aspart (NOVOLOG MIX 70/30) (70-30) 100 UNIT/ML injection Inject 0.15 mLs (15 Units total)  into the skin 3 (three) times daily. Patient taking differently: Inject 35 Units into the skin 3 (three) times daily.  02/20/15  Yes Orvan Falconer, MD  insulin detemir (LEVEMIR) 100 UNIT/ML injection Inject 30 Units into the skin at bedtime.   Yes Historical Provider, MD  lisinopril-hydrochlorothiazide (PRINZIDE,ZESTORETIC) 20-25 MG tablet Take 1 tablet by mouth daily.   Yes Historical  Provider, MD  metoCLOPramide (REGLAN) 10 MG tablet Take 1 tablet (10 mg total) by mouth 3 (three) times daily as needed for nausea (headache / nausea). 07/23/15  Yes Noemi Chapel, MD  pantoprazole (PROTONIX) 40 MG tablet Take 1 tablet (40 mg total) by mouth 2 (two) times daily before a meal. 06/14/15  Yes Mahala Menghini, PA-C  sucralfate (CARAFATE) 1 G tablet Take 1 tablet (1 g total) by mouth 4 (four) times daily. 04/20/15  Yes Mahala Menghini, PA-C  HYDROcodone-acetaminophen (NORCO/VICODIN) 5-325 MG tablet 1 or 2 tabs PO q6 hours prn pain Patient not taking: Reported on 07/12/2015 07/06/15   Francine Graven, DO  traMADol (ULTRAM) 50 MG tablet Take 1 tablet (50 mg total) by mouth every 6 (six) hours as needed for moderate pain. Patient not taking: Reported on 08/07/2015 06/07/15   Forde Dandy, MD   BP 130/83 mmHg  Pulse 82  Temp(Src) 98.7 F (37.1 C) (Oral)  Resp 24  Ht 6' (1.829 m)  Wt 248 lb (112.492 kg)  BMI 33.63 kg/m2  SpO2 97% Physical Exam  Constitutional: He is oriented to person, place, and time. He appears well-developed.  HENT:  Head: Normocephalic.  Eyes: Conjunctivae and EOM are normal. No scleral icterus.  Neck: Neck supple. No thyromegaly present.  Cardiovascular: Normal rate and regular rhythm.  Exam reveals no gallop and no friction rub.   No murmur heard. Pulmonary/Chest: No stridor. He has no wheezes. He has no rales. He exhibits no tenderness.  Abdominal: He exhibits no distension. There is tenderness. There is no rebound.  Musculoskeletal: Normal range of motion. He exhibits no edema.  Lymphadenopathy:    He has no cervical adenopathy.  Neurological: He is oriented to person, place, and time. He exhibits normal muscle tone. Coordination normal.  Skin: No rash noted. No erythema.  Psychiatric: He has a normal mood and affect. His behavior is normal.    ED Course  Procedures (including critical care time) Labs Review Labs Reviewed  COMPREHENSIVE METABOLIC  PANEL - Abnormal; Notable for the following:    Glucose, Bld 141 (*)    All other components within normal limits  LIPASE, BLOOD  CBC  CBC WITH DIFFERENTIAL/PLATELET  DIFFERENTIAL    Imaging Review No results found. I have personally reviewed and evaluated these images and lab results as part of my medical decision-making.   EKG Interpretation None      MDM   Final diagnoses:  Gastroparesis    Patient improved with Reglan and fluids he will follow-up with his GI doctor as planned    Milton Ferguson, MD 08/09/15 [id number]

## 2015-08-12 ENCOUNTER — Encounter (HOSPITAL_COMMUNITY): Payer: Self-pay | Admitting: Emergency Medicine

## 2015-08-12 ENCOUNTER — Emergency Department (HOSPITAL_COMMUNITY)
Admission: EM | Admit: 2015-08-12 | Discharge: 2015-08-12 | Disposition: A | Discharge status: Home / Self Care | Payer: Medicaid Other | Attending: Emergency Medicine | Admitting: Emergency Medicine

## 2015-08-12 DIAGNOSIS — E119 Type 2 diabetes mellitus without complications: Secondary | ICD-10-CM | POA: Diagnosis not present

## 2015-08-12 DIAGNOSIS — K3184 Gastroparesis: Secondary | ICD-10-CM

## 2015-08-12 DIAGNOSIS — R109 Unspecified abdominal pain: Secondary | ICD-10-CM | POA: Diagnosis present

## 2015-08-12 DIAGNOSIS — H409 Unspecified glaucoma: Secondary | ICD-10-CM | POA: Diagnosis not present

## 2015-08-12 DIAGNOSIS — Z880 Allergy status to penicillin: Secondary | ICD-10-CM | POA: Insufficient documentation

## 2015-08-12 DIAGNOSIS — Z8619 Personal history of other infectious and parasitic diseases: Secondary | ICD-10-CM | POA: Diagnosis not present

## 2015-08-12 DIAGNOSIS — Z794 Long term (current) use of insulin: Secondary | ICD-10-CM | POA: Diagnosis not present

## 2015-08-12 DIAGNOSIS — Z792 Long term (current) use of antibiotics: Secondary | ICD-10-CM | POA: Diagnosis not present

## 2015-08-12 DIAGNOSIS — I10 Essential (primary) hypertension: Secondary | ICD-10-CM | POA: Insufficient documentation

## 2015-08-12 DIAGNOSIS — G8929 Other chronic pain: Secondary | ICD-10-CM | POA: Insufficient documentation

## 2015-08-12 DIAGNOSIS — K2970 Gastritis, unspecified, without bleeding: Secondary | ICD-10-CM | POA: Diagnosis not present

## 2015-08-12 DIAGNOSIS — Z79899 Other long term (current) drug therapy: Secondary | ICD-10-CM | POA: Diagnosis not present

## 2015-08-12 DIAGNOSIS — K219 Gastro-esophageal reflux disease without esophagitis: Secondary | ICD-10-CM | POA: Diagnosis not present

## 2015-08-12 HISTORY — PX: ESOPHAGOGASTRODUODENOSCOPY: SHX1529

## 2015-08-12 LAB — COMPREHENSIVE METABOLIC PANEL
ALT: 18 U/L (ref 17–63)
AST: 27 U/L (ref 15–41)
Albumin: 4.1 g/dL (ref 3.5–5.0)
Alkaline Phosphatase: 44 U/L (ref 38–126)
Anion gap: 12 (ref 5–15)
BUN: 11 mg/dL (ref 6–20)
CO2: 22 mmol/L (ref 22–32)
Calcium: 9.3 mg/dL (ref 8.9–10.3)
Chloride: 103 mmol/L (ref 101–111)
Creatinine, Ser: 1.11 mg/dL (ref 0.61–1.24)
GFR calc Af Amer: >60 mL/min (ref >60)
GFR calc non Af Amer: >60 mL/min (ref >60)
Glucose, Bld: 204 mg/dL — ABNORMAL HIGH (ref 65–99)
Potassium: 4.8 mmol/L (ref 3.5–5.1)
Sodium: 137 mmol/L (ref 135–145)
Total Bilirubin: 1.7 mg/dL — ABNORMAL HIGH (ref 0.3–1.2)
Total Protein: 7.1 g/dL (ref 6.5–8.1)

## 2015-08-12 LAB — URINE MICROSCOPIC-ADD ON
Bacteria, UA: NONE SEEN
Squamous Epithelial / LPF: NONE SEEN
WBC, UA: NONE SEEN WBC/hpf (ref 0–5)

## 2015-08-12 LAB — CBC
HCT: 40 % (ref 39.0–52.0)
Hemoglobin: 14.3 g/dL (ref 13.0–17.0)
MCH: 31.7 pg (ref 26.0–34.0)
MCHC: 35.8 g/dL (ref 30.0–36.0)
MCV: 88.7 fL (ref 78.0–100.0)
Platelets: 164 10*3/uL (ref 150–400)
RBC: 4.51 MIL/uL (ref 4.22–5.81)
RDW: 12.7 % (ref 11.5–15.5)
WBC: 10.5 10*3/uL (ref 4.0–10.5)

## 2015-08-12 LAB — URINALYSIS, ROUTINE W REFLEX MICROSCOPIC
Bilirubin Urine: NEGATIVE
Glucose, UA: >1000 mg/dL — AB
Ketones, ur: 15 mg/dL — AB
Leukocytes, UA: NEGATIVE
Nitrite: NEGATIVE
Protein, ur: 100 mg/dL — AB
Specific Gravity, Urine: 1.015 (ref 1.005–1.030)
pH: 6 (ref 5.0–8.0)

## 2015-08-12 LAB — LIPASE, BLOOD: Lipase: 23 U/L (ref 11–51)

## 2015-08-12 MED ORDER — SODIUM CHLORIDE 0.9 % IV BOLUS (SEPSIS): 1000.0000 mL | Freq: Once | INTRAVENOUS | Status: DC

## 2015-08-12 MED ORDER — HYDROMORPHONE HCL 1 MG/ML IJ SOLN
1.0000 mg | Freq: Once | INTRAMUSCULAR | Status: AC
  Administered 2015-08-12: 1 mg via INTRAVENOUS
  Filled 2015-08-12: qty 1

## 2015-08-12 MED ORDER — SODIUM CHLORIDE 0.9 % IV BOLUS (SEPSIS)
1000.0000 mL | Freq: Once | INTRAVENOUS | Status: AC
  Administered 2015-08-12: 1000 mL via INTRAVENOUS

## 2015-08-12 MED ORDER — METOCLOPRAMIDE HCL 5 MG/ML IJ SOLN
10.0000 mg | Freq: Once | INTRAMUSCULAR | Status: AC
  Administered 2015-08-12: 10 mg via INTRAVENOUS
  Filled 2015-08-12: qty 2

## 2015-08-12 NOTE — ED Notes (Addendum)
Pt reports severe mid, upper abdominal pain x 2 days. Reports n/v. Denies diarrhea. Hx of gastroparesis.

## 2015-08-12 NOTE — Discharge Instructions (Signed)
Gastroparesis °Gastroparesis, also called delayed gastric emptying, is a condition in which food takes longer than normal to empty from the stomach. The condition is usually long-lasting (chronic). °CAUSES °This condition may be caused by: °· An endocrine disorder, such as hypothyroidism or diabetes. Diabetes is the most common cause of this condition. °· A nervous system disease, such as Parkinson disease or multiple sclerosis. °· Cancer, infection, or surgery of the stomach or vagus nerve. °· A connective tissue disorder, such as scleroderma. °· Certain medicines. °In most cases, the cause is not known. °RISK FACTORS °This condition is more likely to develop in: °· People with certain disorders, including endocrine disorders, eating disorders, amyloidosis, and scleroderma. °· People with certain diseases, including Parkinson disease or multiple sclerosis. °· People with cancer or infection of the stomach or vagus nerve. °· People who have had surgery on the stomach or vagus nerve. °· People who take certain medicines. °· Women. °SYMPTOMS °Symptoms of this condition include: °· An early feeling of fullness when eating. °· Nausea. °· Weight loss. °· Vomiting. °· Heartburn. °· Abdominal bloating. °· Inconsistent blood glucose levels. °· Lack of appetite. °· Acid from the stomach coming up into the esophagus (gastroesophageal reflux). °· Spasms of the stomach. °Symptoms may come and go. °DIAGNOSIS °This condition is diagnosed with tests, such as: °· Tests that check how long it takes food to move through the stomach and intestines. These tests include: °¨ Upper gastrointestinal (GI) series. In this test, X-rays of the intestines are taken after you drink a liquid. The liquid makes the intestines show up better on the X-rays. °¨ Gastric emptying scintigraphy. In this test, scans are taken after you eat food that contains a small amount of radioactive material. °¨ Wireless capsule GI monitoring system. This test  involves swallowing a capsule that records information about movement through the stomach. °· Gastric manometry. This test measures electrical and muscular activity in the stomach. It is done with a thin tube that is passed down the throat and into the stomach. °· Endoscopy. This test checks for abnormalities in the lining of the stomach. It is done with a long, thin tube that is passed down the throat and into the stomach. °· An ultrasound. This test can help rule out gallbladder disease or pancreatitis as a cause of your symptoms. It uses sound waves to take pictures of the inside of your body. °TREATMENT °There is no cure for gastroparesis. This condition may be managed with: °· Treatment of the underlying condition causing the gastroparesis. °· Lifestyle changes, including exercise and dietary changes. Dietary changes can include: °¨ Changes in what and when you eat. °¨ Eating smaller meals more often. °¨ Eating low-fat foods. °¨ Eating low-fiber forms of high-fiber foods, such as cooked vegetables instead of raw vegetables. °¨ Having liquid foods in place of solid foods. Liquid foods are easier to digest. °· Medicines. These may be given to control nausea and vomiting and to stimulate stomach muscles. °· Getting food through a feeding tube. This may be done in severe cases. °· A gastric neurostimulator. This is a device that is inserted into the body with surgery. It helps improve stomach emptying and control nausea and vomiting. °HOME CARE INSTRUCTIONS °· Follow your health care provider's instructions about exercise and diet. °· Take medicines only as directed by your health care provider. °SEEK MEDICAL CARE IF: °· Your symptoms do not improve with treatment. °· You have new symptoms. °SEEK IMMEDIATE MEDICAL CARE IF: °· You have   severe abdominal pain that does not improve with treatment.  You have nausea that does not go away.  You cannot keep fluids down.   This information is not intended to replace  advice given to you by your health care provider. Make sure you discuss any questions you have with your health care provider.   Document Released: 07/28/2005 Document Revised: 12/12/2014 Document Reviewed: 07/24/2014 Elsevier Interactive Patient Education 2016 [place] liquids today. Follow-up your doctor at [facility] on Wednesday.

## 2015-08-12 NOTE — ED Provider Notes (Signed)
CSN: [id number]     Arrival date & time 08/12/15  [id number] History  By signing my name below, I, [facility], attest that this documentation has been prepared under the direction and in the presence of [name], MD. Electronically Signed: Virgel Bouquet, ED Scribe. 08/12/2015. 10:58 AM.   Chief Complaint  Patient presents with  . Abdominal Pain   The history is provided by the patient. No language interpreter was used.   HPI Comments: Timothy Clark is a 45 y.o. male brought in by his sisters with hx chronic abdominal pain, DM, gastroparesis, gastritis, esophagitis and GERD who presents to the Emergency Department complaining of "severe", constant epigastric pain for the past 2 days. Patient reports that his CBG has been elevated for the past few days. He endorses associated nausea and vomiting. Sisters report that patient has an appointment with a gastroenterologist 3 days from today. He has taken Percocet, last dose last night, with no relief. He denies diarrhea.  Past Medical History  Diagnosis Date  . Type 2 diabetes mellitus with complications ([place])     diagnosed around age 69  . Glaucoma   . Eye hemorrhage   . Gastritis   . GERD (gastroesophageal reflux disease)   . Esophagitis   . Hiccups   . Helicobacter pylori (H. pylori) infection   . Hypertension   . Gastroparesis   . Chronic abdominal pain   . Nausea and vomiting     chronic, recurrent   Past Surgical History  Procedure Laterality Date  . Eye surgery    . Esophagogastroduodenoscopy  2015    Dr. Britta Mccreedy  . Esophagogastroduodenoscopy N/A 10/26/2014    RMR: Distal esophagititis-likely reflux related although an element of pill induced injuruy no exclueded  status post biopsy. Diffusely abnormal gastric mucosa of uncertain signigicane -status post gastric biopsy. Focal area of excoriation in the cardia most consistant with a trauma of heaving.   . Eye surgery     Family History  Problem Relation Age of Onset   . Ovarian cancer Mother   . Cervical cancer Sister   . Diabetes Sister   . Colon cancer Neg Hx    Social History  Substance Use Topics  . Smoking status: Never Smoker   . Smokeless tobacco: Never Used  . Alcohol Use: No    Review of Systems A complete 10 system review of systems was obtained and all systems are negative except as noted in the HPI and PMH.    Allergies  Donnatal; Fish allergy; Haldol; Lidocaine; Maalox; Aspirin; Bactrim; Doxycycline; Penicillins; Phenergan; and Zofran  Home Medications   Prior to Admission medications   Medication Sig Start Date End Date Taking? Authorizing Provider  brimonidine-timolol (COMBIGAN) 0.2-0.5 % ophthalmic solution Place 1 drop into both eyes at bedtime.   Yes Historical Provider, MD  erythromycin (E-MYCIN) 250 MG tablet Take 250 mg by mouth daily.   Yes Historical Provider, MD  gabapentin (NEURONTIN) 600 MG tablet Take 600 mg by mouth daily as needed (for neuropathy).    Yes Historical Provider, MD  hydroxypropyl methylcellulose / hypromellose (ISOPTO TEARS / GONIOVISC) 2.5 % ophthalmic solution Place 1 drop into both eyes as needed for dry eyes.   Yes Historical Provider, MD  insulin aspart protamine- aspart (NOVOLOG MIX 70/30) (70-30) 100 UNIT/ML injection Inject 0.15 mLs (15 Units total) into the skin 3 (three) times daily. Patient taking differently: Inject 35 Units into the skin 3 (three) times daily.  02/20/15  Yes Orvan Falconer, MD  insulin detemir (LEVEMIR) 100 UNIT/ML injection Inject 30 Units into the skin at bedtime.   Yes Historical Provider, MD  lisinopril-hydrochlorothiazide (PRINZIDE,ZESTORETIC) 20-25 MG tablet Take 1 tablet by mouth daily.   Yes Historical Provider, MD  metoCLOPramide (REGLAN) 10 MG tablet Take 1 tablet (10 mg total) by mouth 3 (three) times daily as needed for nausea (headache / nausea). 07/23/15  Yes Noemi Chapel, MD  pantoprazole (PROTONIX) 40 MG tablet Take 1 tablet (40 mg total) by mouth 2 (two) times  daily before a meal. 06/14/15  Yes Mahala Menghini, PA-C  sucralfate (CARAFATE) 1 G tablet Take 1 tablet (1 g total) by mouth 4 (four) times daily. 04/20/15  Yes Mahala Menghini, PA-C  HYDROcodone-acetaminophen (NORCO/VICODIN) 5-325 MG tablet 1 or 2 tabs PO q6 hours prn pain Patient not taking: Reported on 07/12/2015 07/06/15   Francine Graven, DO  traMADol (ULTRAM) 50 MG tablet Take 1 tablet (50 mg total) by mouth every 6 (six) hours as needed for moderate pain. Patient not taking: Reported on 08/07/2015 06/07/15   Forde Dandy, MD   BP 151/85 mmHg  Pulse 86  Temp(Src) 98.5 F (36.9 C) (Oral)  Resp 21  SpO2 97% Physical Exam  Constitutional: He is oriented to person, place, and time. He appears well-developed and well-nourished.  HENT:  Head: Normocephalic and atraumatic.  Eyes: Conjunctivae and EOM are normal. Pupils are equal, round, and reactive to light.  Neck: Normal range of motion. Neck supple.  Cardiovascular: Normal rate and regular rhythm.   Pulmonary/Chest: Effort normal and breath sounds normal.  Abdominal: Soft. Bowel sounds are normal.  Tenderness in epigastrium.  Musculoskeletal: Normal range of motion.  Neurological: He is alert and oriented to person, place, and time.  Skin: Skin is warm and dry.  Psychiatric: He has a normal mood and affect. His behavior is normal.  Nursing note and vitals reviewed.   ED Course  Procedures   DIAGNOSTIC STUDIES: Oxygen Saturation is 98% on RA, normal by my interpretation.    COORDINATION OF CARE: 10:40 AM Will order IV fluids and pain medication. Discussed treatment plan with pt at bedside and pt agreed to plan.  Labs Review Labs Reviewed  COMPREHENSIVE METABOLIC PANEL - Abnormal; Notable for the following:    Glucose, Bld 204 (*)    Total Bilirubin 1.7 (*)    All other components within normal limits  URINALYSIS, ROUTINE W REFLEX MICROSCOPIC (NOT AT [facility]) - Abnormal; Notable for the following:    Glucose, UA >1000 (*)     Hgb urine dipstick MODERATE (*)    Ketones, ur 15 (*)    Protein, ur 100 (*)    All other components within normal limits  LIPASE, BLOOD  CBC  URINE MICROSCOPIC-ADD ON    Imaging Review No results found. I have personally reviewed and evaluated lab results as part of my medical decision-making.   EKG Interpretation None      MDM   Final diagnoses:  Gastroparesis    No acute abdomen. Patient feels better after IV fluids and pain management. Glucose 204. He has follow-up at [facility] on Wednesday.  I personally performed the services described in this documentation, which was scribed in my presence. The recorded information has been reviewed and is accurate.    [name], MD 08/12/15 1339

## 2015-08-14 ENCOUNTER — Emergency Department (HOSPITAL_COMMUNITY)
Admission: EM | Admit: 2015-08-14 | Discharge: 2015-08-14 | Disposition: A | Discharge status: Home / Self Care | Payer: Medicaid Other | Attending: Emergency Medicine | Admitting: Emergency Medicine

## 2015-08-14 ENCOUNTER — Encounter (HOSPITAL_COMMUNITY): Payer: Self-pay | Admitting: Emergency Medicine

## 2015-08-14 DIAGNOSIS — K219 Gastro-esophageal reflux disease without esophagitis: Secondary | ICD-10-CM | POA: Diagnosis not present

## 2015-08-14 DIAGNOSIS — I10 Essential (primary) hypertension: Secondary | ICD-10-CM | POA: Insufficient documentation

## 2015-08-14 DIAGNOSIS — Z79899 Other long term (current) drug therapy: Secondary | ICD-10-CM | POA: Insufficient documentation

## 2015-08-14 DIAGNOSIS — G8929 Other chronic pain: Secondary | ICD-10-CM | POA: Insufficient documentation

## 2015-08-14 DIAGNOSIS — Z794 Long term (current) use of insulin: Secondary | ICD-10-CM | POA: Insufficient documentation

## 2015-08-14 DIAGNOSIS — Z792 Long term (current) use of antibiotics: Secondary | ICD-10-CM | POA: Insufficient documentation

## 2015-08-14 DIAGNOSIS — H409 Unspecified glaucoma: Secondary | ICD-10-CM | POA: Insufficient documentation

## 2015-08-14 DIAGNOSIS — E119 Type 2 diabetes mellitus without complications: Secondary | ICD-10-CM | POA: Insufficient documentation

## 2015-08-14 DIAGNOSIS — Z880 Allergy status to penicillin: Secondary | ICD-10-CM | POA: Insufficient documentation

## 2015-08-14 DIAGNOSIS — Z8619 Personal history of other infectious and parasitic diseases: Secondary | ICD-10-CM | POA: Diagnosis not present

## 2015-08-14 DIAGNOSIS — K3184 Gastroparesis: Secondary | ICD-10-CM | POA: Diagnosis not present

## 2015-08-14 DIAGNOSIS — R109 Unspecified abdominal pain: Secondary | ICD-10-CM | POA: Diagnosis present

## 2015-08-14 LAB — CBC WITH DIFFERENTIAL/PLATELET
Basophils Absolute: 0 10*3/uL (ref 0.0–0.1)
Basophils Relative: 0 %
Eosinophils Absolute: 0 10*3/uL (ref 0.0–0.7)
Eosinophils Relative: 0 %
HCT: 42 % (ref 39.0–52.0)
Hemoglobin: 15 g/dL (ref 13.0–17.0)
Lymphocytes Relative: 19 %
Lymphs Abs: 1.8 10*3/uL (ref 0.7–4.0)
MCH: 31.8 pg (ref 26.0–34.0)
MCHC: 35.7 g/dL (ref 30.0–36.0)
MCV: 89.2 fL (ref 78.0–100.0)
Monocytes Absolute: 0.7 10*3/uL (ref 0.1–1.0)
Monocytes Relative: 7 %
Neutro Abs: 6.9 10*3/uL (ref 1.7–7.7)
Neutrophils Relative %: 74 %
Platelets: 162 10*3/uL (ref 150–400)
RBC: 4.71 MIL/uL (ref 4.22–5.81)
RDW: 12.8 % (ref 11.5–15.5)
WBC: 9.4 10*3/uL (ref 4.0–10.5)

## 2015-08-14 LAB — COMPREHENSIVE METABOLIC PANEL
ALT: 18 U/L (ref 17–63)
AST: 23 U/L (ref 15–41)
Albumin: 4.2 g/dL (ref 3.5–5.0)
Alkaline Phosphatase: 41 U/L (ref 38–126)
Anion gap: 11 (ref 5–15)
BUN: 11 mg/dL (ref 6–20)
CO2: 25 mmol/L (ref 22–32)
Calcium: 9.4 mg/dL (ref 8.9–10.3)
Chloride: 103 mmol/L (ref 101–111)
Creatinine, Ser: 1.08 mg/dL (ref 0.61–1.24)
GFR calc Af Amer: >60 mL/min (ref >60)
GFR calc non Af Amer: >60 mL/min (ref >60)
Glucose, Bld: 155 mg/dL — ABNORMAL HIGH (ref 65–99)
Potassium: 3.7 mmol/L (ref 3.5–5.1)
Sodium: 139 mmol/L (ref 135–145)
Total Bilirubin: 1.3 mg/dL — ABNORMAL HIGH (ref 0.3–1.2)
Total Protein: 7.5 g/dL (ref 6.5–8.1)

## 2015-08-14 LAB — URINALYSIS, ROUTINE W REFLEX MICROSCOPIC
Glucose, UA: >1000 mg/dL — AB
Ketones, ur: 40 mg/dL — AB
Leukocytes, UA: NEGATIVE
Nitrite: NEGATIVE
Protein, ur: >300 mg/dL — AB
Specific Gravity, Urine: >1.03 — ABNORMAL HIGH (ref 1.005–1.030)
pH: 5.5 (ref 5.0–8.0)

## 2015-08-14 LAB — URINE MICROSCOPIC-ADD ON

## 2015-08-14 LAB — LIPASE, BLOOD: Lipase: 29 U/L (ref 11–51)

## 2015-08-14 LAB — CBG MONITORING, ED: Glucose-Capillary: 147 mg/dL — ABNORMAL HIGH (ref 65–99)

## 2015-08-14 MED ORDER — LORAZEPAM 2 MG/ML IJ SOLN
1.0000 mg | Freq: Once | INTRAMUSCULAR | Status: AC
  Administered 2015-08-14: 1 mg via INTRAVENOUS
  Filled 2015-08-14: qty 1

## 2015-08-14 MED ORDER — HYDROMORPHONE HCL 1 MG/ML IJ SOLN
1.0000 mg | Freq: Once | INTRAMUSCULAR | Status: AC
  Administered 2015-08-14: 1 mg via INTRAVENOUS
  Filled 2015-08-14: qty 1

## 2015-08-14 MED ORDER — HYDROCHLOROTHIAZIDE 25 MG PO TABS
25.0000 mg | ORAL_TABLET | Freq: Every day | ORAL | Status: DC
  Administered 2015-08-14: 25 mg via ORAL
  Filled 2015-08-14: qty 1

## 2015-08-14 MED ORDER — PROCHLORPERAZINE EDISYLATE 5 MG/ML IJ SOLN
10.0000 mg | Freq: Once | INTRAMUSCULAR | Status: AC
  Administered 2015-08-14: 10 mg via INTRAVENOUS
  Filled 2015-08-14: qty 2

## 2015-08-14 MED ORDER — DIPHENHYDRAMINE HCL 50 MG/ML IJ SOLN
25.0000 mg | Freq: Once | INTRAMUSCULAR | Status: AC
  Administered 2015-08-14: 25 mg via INTRAVENOUS
  Filled 2015-08-14: qty 1

## 2015-08-14 MED ORDER — SODIUM CHLORIDE 0.9 % IV BOLUS (SEPSIS)
1000.0000 mL | Freq: Once | INTRAVENOUS | Status: AC
  Administered 2015-08-14: 1000 mL via INTRAVENOUS

## 2015-08-14 MED ORDER — METOCLOPRAMIDE HCL 5 MG/ML IJ SOLN
10.0000 mg | Freq: Once | INTRAMUSCULAR | Status: AC
  Administered 2015-08-14: 10 mg via INTRAVENOUS
  Filled 2015-08-14: qty 2

## 2015-08-14 MED ORDER — LISINOPRIL 10 MG PO TABS
20.0000 mg | ORAL_TABLET | Freq: Every day | ORAL | Status: DC
  Administered 2015-08-14: 20 mg via ORAL
  Filled 2015-08-14: qty 2

## 2015-08-14 MED ORDER — LISINOPRIL-HYDROCHLOROTHIAZIDE 20-25 MG PO TABS: 1.0000 | ORAL_TABLET | Freq: Every day | ORAL | Status: DC

## 2015-08-14 NOTE — Discharge Instructions (Signed)
Gastroparesis °Gastroparesis, also called delayed gastric emptying, is a condition in which food takes longer than normal to empty from the stomach. The condition is usually long-lasting (chronic). °CAUSES °This condition may be caused by: °· An endocrine disorder, such as hypothyroidism or diabetes. Diabetes is the most common cause of this condition. °· A nervous system disease, such as Parkinson disease or multiple sclerosis. °· Cancer, infection, or surgery of the stomach or vagus nerve. °· A connective tissue disorder, such as scleroderma. °· Certain medicines. °In most cases, the cause is not known. °RISK FACTORS °This condition is more likely to develop in: °· People with certain disorders, including endocrine disorders, eating disorders, amyloidosis, and scleroderma. °· People with certain diseases, including Parkinson disease or multiple sclerosis. °· People with cancer or infection of the stomach or vagus nerve. °· People who have had surgery on the stomach or vagus nerve. °· People who take certain medicines. °· Women. °SYMPTOMS °Symptoms of this condition include: °· An early feeling of fullness when eating. °· Nausea. °· Weight loss. °· Vomiting. °· Heartburn. °· Abdominal bloating. °· Inconsistent blood glucose levels. °· Lack of appetite. °· Acid from the stomach coming up into the esophagus (gastroesophageal reflux). °· Spasms of the stomach. °Symptoms may come and go. °DIAGNOSIS °This condition is diagnosed with tests, such as: °· Tests that check how long it takes food to move through the stomach and intestines. These tests include: °¨ Upper gastrointestinal (GI) series. In this test, X-rays of the intestines are taken after you drink a liquid. The liquid makes the intestines show up better on the X-rays. °¨ Gastric emptying scintigraphy. In this test, scans are taken after you eat food that contains a small amount of radioactive material. °¨ Wireless capsule GI monitoring system. This test  involves swallowing a capsule that records information about movement through the stomach. °· Gastric manometry. This test measures electrical and muscular activity in the stomach. It is done with a thin tube that is passed down the throat and into the stomach. °· Endoscopy. This test checks for abnormalities in the lining of the stomach. It is done with a long, thin tube that is passed down the throat and into the stomach. °· An ultrasound. This test can help rule out gallbladder disease or pancreatitis as a cause of your symptoms. It uses sound waves to take pictures of the inside of your body. °TREATMENT °There is no cure for gastroparesis. This condition may be managed with: °· Treatment of the underlying condition causing the gastroparesis. °· Lifestyle changes, including exercise and dietary changes. Dietary changes can include: °¨ Changes in what and when you eat. °¨ Eating smaller meals more often. °¨ Eating low-fat foods. °¨ Eating low-fiber forms of high-fiber foods, such as cooked vegetables instead of raw vegetables. °¨ Having liquid foods in place of solid foods. Liquid foods are easier to digest. °· Medicines. These may be given to control nausea and vomiting and to stimulate stomach muscles. °· Getting food through a feeding tube. This may be done in severe cases. °· A gastric neurostimulator. This is a device that is inserted into the body with surgery. It helps improve stomach emptying and control nausea and vomiting. °HOME CARE INSTRUCTIONS °· Follow your health care provider's instructions about exercise and diet. °· Take medicines only as directed by your health care provider. °SEEK MEDICAL CARE IF: °· Your symptoms do not improve with treatment. °· You have new symptoms. °SEEK IMMEDIATE MEDICAL CARE IF: °· You have   severe abdominal pain that does not improve with treatment. °· You have nausea that does not go away. °· You cannot keep fluids down. °  °This information is not intended to replace  advice given to you by your health care provider. Make sure you discuss any questions you have with your health care provider. °  °Document Released: 07/28/2005 Document Revised: 12/12/2014 Document Reviewed: 07/24/2014 °Elsevier Interactive Patient Education ©2016 [employer] ° °

## 2015-08-14 NOTE — ED Notes (Signed)
Dr. Wyvonnia Dusky informed pt had not had fluid challenge, still vomiting.

## 2015-08-14 NOTE — ED Provider Notes (Signed)
CSN: [id number]     Arrival date & time 08/14/15  1117 History  By signing my name below, I, Timothy Clark, attest that this documentation has been prepared under the direction and in the presence of Timothy Essex, MD. Electronically Signed: Terressa Clark, ED Scribe. 08/14/2015. 1:08 PM.  Chief Complaint  Patient presents with  . Abdominal Pain  . Emesis    once this am.    Patient is a 45 y.o. male presenting with vomiting. The history is provided by the patient. No language interpreter was used.  Emesis  PCP: Tawni Carnes, PA-C HPI Comments: Timothy Clark is a 45 y.o. male, arriving via ambulance, with PMHx noted below including gastroparesis, chronic abd pain, DMTII (pt reports his glucose levels are within the 100s), who presents to the Emergency Department complaining of chronic abd pain with current episode onset 4 days ago and worsening in the last 24 hours. Associated Sx include one episode of vomiting for "10 minutes" yesterday. Pt reports current Sx are consistent with prior gastroparesis flare ups. Pt reports he was seen for the same at the ED 2 days ago. Following said ED visit, pt reports some improvement of Sx, however, his Sx worsened within the past 24 hours. Pt denies Hx of asthma, COPD, heart problems. Pt denies fever, chest pain, diarrhea, back pain.   Past Medical History  Diagnosis Date  . Type 2 diabetes mellitus with complications ([place])     diagnosed around age 22  . Glaucoma   . Eye hemorrhage   . Gastritis   . GERD (gastroesophageal reflux disease)   . Esophagitis   . Hiccups   . Helicobacter pylori (H. pylori) infection   . Hypertension   . Gastroparesis   . Chronic abdominal pain   . Nausea and vomiting     chronic, recurrent   Past Surgical History  Procedure Laterality Date  . Eye surgery    . Esophagogastroduodenoscopy  2015    Dr. Britta Mccreedy  . Esophagogastroduodenoscopy N/A 10/26/2014    RMR: Distal esophagititis-likely reflux related although an  element of pill induced injuruy no exclueded  status post biopsy. Diffusely abnormal gastric mucosa of uncertain signigicane -status post gastric biopsy. Focal area of excoriation in the cardia most consistant with a trauma of heaving.   . Eye surgery     Family History  Problem Relation Age of Onset  . Ovarian cancer Mother   . Cervical cancer Sister   . Diabetes Sister   . Colon cancer Neg Hx    Social History  Substance Use Topics  . Smoking status: Never Smoker   . Smokeless tobacco: Never Used  . Alcohol Use: No    Review of Systems  Gastrointestinal: Positive for vomiting.    A complete 10 system review of systems was obtained and all systems are negative except as noted in the HPI and PMH.    Allergies  Donnatal; Fish allergy; Haldol; Lidocaine; Maalox; Aspirin; Bactrim; Doxycycline; Penicillins; Phenergan; and Zofran  Home Medications   Prior to Admission medications   Medication Sig Start Date End Date Taking? Authorizing Provider  brimonidine-timolol (COMBIGAN) 0.2-0.5 % ophthalmic solution Place 1 drop into both eyes at bedtime.   Yes Historical Provider, MD  erythromycin (E-MYCIN) 250 MG tablet Take 250 mg by mouth daily.   Yes Historical Provider, MD  gabapentin (NEURONTIN) 600 MG tablet Take 600 mg by mouth daily as needed (for neuropathy).    Yes Historical Provider, MD  hydroxypropyl methylcellulose / hypromellose (ISOPTO  TEARS / GONIOVISC) 2.5 % ophthalmic solution Place 1 drop into both eyes as needed for dry eyes.   Yes Historical Provider, MD  insulin aspart protamine- aspart (NOVOLOG MIX 70/30) (70-30) 100 UNIT/ML injection Inject 0.15 mLs (15 Units total) into the skin 3 (three) times daily. Patient taking differently: Inject 35 Units into the skin 3 (three) times daily.  02/20/15  Yes Orvan Falconer, MD  insulin detemir (LEVEMIR) 100 UNIT/ML injection Inject 30 Units into the skin at bedtime.   Yes Historical Provider, MD  lisinopril-hydrochlorothiazide  (PRINZIDE,ZESTORETIC) 20-25 MG tablet Take 1 tablet by mouth daily.   Yes Historical Provider, MD  metoCLOPramide (REGLAN) 10 MG tablet Take 1 tablet (10 mg total) by mouth 3 (three) times daily as needed for nausea (headache / nausea). 07/23/15  Yes Noemi Chapel, MD  pantoprazole (PROTONIX) 40 MG tablet Take 1 tablet (40 mg total) by mouth 2 (two) times daily before a meal. 06/14/15  Yes Mahala Menghini, PA-C  sucralfate (CARAFATE) 1 G tablet Take 1 tablet (1 g total) by mouth 4 (four) times daily. 04/20/15  Yes Mahala Menghini, PA-C   Triage Vitals: BP 134/81 mmHg  Pulse 89  Temp(Src) 99 F (37.2 C) (Temporal)  Resp 18  Ht 5\' 7"  (1.702 m)  Wt 148 lb (67.132 kg)  BMI 23.17 kg/m2  SpO2 100% Physical Exam  Constitutional: He is oriented to person, place, and time. He appears well-developed and well-nourished. No distress.  Dry heaving.  HENT:  Head: Normocephalic and atraumatic.  Mouth/Throat: Oropharynx is clear and moist. No oropharyngeal exudate.  Eyes:   Left eye strabismus. Left pupil not reactive (baseline).    Neck: Normal range of motion. Neck supple.  No meningismus.  Cardiovascular: Normal rate, regular rhythm, normal heart sounds and intact distal pulses.   No murmur heard. Pulmonary/Chest: Effort normal and breath sounds normal. No respiratory distress.  Abdominal: Soft. There is tenderness in the epigastric area. There is no rebound and no guarding.  Musculoskeletal: Normal range of motion. He exhibits no edema or tenderness.  Neurological: He is alert and oriented to person, place, and time. No cranial nerve deficit. He exhibits normal muscle tone. Coordination normal.  No ataxia on finger to nose bilaterally. No pronator drift. 5/5 strength throughout. CN 2-12 intact.Equal grip strength. Sensation intact.   Skin: Skin is warm.  Psychiatric: He has a normal mood and affect. His behavior is normal.  Nursing note and vitals reviewed.   ED Course  Procedures (including  critical care time) DIAGNOSTIC STUDIES: Oxygen Saturation is 100% on ra, nl by my interpretation.    COORDINATION OF CARE: 1:04 PM: Discussed treatment plan which includes meds, labs with pt at bedside; patient verbalizes understanding and agrees with treatment plan.  Labs Review Labs Reviewed  COMPREHENSIVE METABOLIC PANEL - Abnormal; Notable for the following:    Glucose, Bld 155 (*)    Total Bilirubin 1.3 (*)    All other components within normal limits  URINALYSIS, ROUTINE W REFLEX MICROSCOPIC (NOT AT [facility]) - Abnormal; Notable for the following:    Specific Gravity, Urine >1.030 (*)    Glucose, UA >1000 (*)    Hgb urine dipstick MODERATE (*)    Bilirubin Urine SMALL (*)    Ketones, ur 40 (*)    Protein, ur >300 (*)    All other components within normal limits  URINE MICROSCOPIC-ADD ON - Abnormal; Notable for the following:    Squamous Epithelial / LPF 0-5 (*)  Bacteria, UA RARE (*)    Casts HYALINE CASTS (*)    All other components within normal limits  CBG MONITORING, ED - Abnormal; Notable for the following:    Glucose-Capillary 147 (*)    All other components within normal limits  CBC WITH DIFFERENTIAL/PLATELET  LIPASE, BLOOD    I have personally reviewed and evaluated these lab results as part of my medical decision-making.   MDM   Final diagnoses:  Gastroparesis   patient with typical epigastric pain, nausea and vomiting consistent with his previous gastroparesis exacerbations. Multiple previous visits for same. epigastric tenderness without peritoneal signs.   Labs show normal LFTs and lipase. Hyperglycemia without DKA. In an gap is normal.   Patient given IV fluids, antiemetics   Symptoms improved in the ED without further vomiting. He has GI follow-up tomorrow. Continue PPI HR and BP have improved. Sugar 147. Tolerating PO in the ED, return precautions discussed.  BP 164/108 mmHg  Pulse 108  Temp(Src) 98.8 F (37.1 C) (Oral)  Resp 16  Ht 5\' 7"   (1.702 m)  Wt 148 lb (67.132 kg)  BMI 23.17 kg/m2  SpO2 97%   Timothy Essex, MD 08/14/15 1911

## 2015-08-14 NOTE — ED Notes (Signed)
Started with vomiting constantly this am.  C/o abdominal pain rates pain 10/10.  Had appointment tomorrow 2;30pm at [facility].

## 2015-08-29 ENCOUNTER — Other Ambulatory Visit: Payer: Self-pay | Admitting: Gastroenterology

## 2015-09-05 ENCOUNTER — Encounter (HOSPITAL_COMMUNITY): Payer: Self-pay | Admitting: Emergency Medicine

## 2015-09-05 ENCOUNTER — Emergency Department (HOSPITAL_COMMUNITY)
Admission: EM | Admit: 2015-09-05 | Discharge: 2015-09-05 | Disposition: A | Discharge status: Home / Self Care | Payer: Medicaid Other | Attending: Emergency Medicine | Admitting: Emergency Medicine

## 2015-09-05 DIAGNOSIS — I10 Essential (primary) hypertension: Secondary | ICD-10-CM | POA: Insufficient documentation

## 2015-09-05 DIAGNOSIS — H409 Unspecified glaucoma: Secondary | ICD-10-CM | POA: Diagnosis not present

## 2015-09-05 DIAGNOSIS — Z8619 Personal history of other infectious and parasitic diseases: Secondary | ICD-10-CM | POA: Diagnosis not present

## 2015-09-05 DIAGNOSIS — K219 Gastro-esophageal reflux disease without esophagitis: Secondary | ICD-10-CM | POA: Insufficient documentation

## 2015-09-05 DIAGNOSIS — E119 Type 2 diabetes mellitus without complications: Secondary | ICD-10-CM | POA: Insufficient documentation

## 2015-09-05 DIAGNOSIS — G8929 Other chronic pain: Secondary | ICD-10-CM | POA: Diagnosis not present

## 2015-09-05 DIAGNOSIS — Z880 Allergy status to penicillin: Secondary | ICD-10-CM | POA: Insufficient documentation

## 2015-09-05 DIAGNOSIS — Z794 Long term (current) use of insulin: Secondary | ICD-10-CM | POA: Insufficient documentation

## 2015-09-05 DIAGNOSIS — R109 Unspecified abdominal pain: Secondary | ICD-10-CM | POA: Diagnosis present

## 2015-09-05 DIAGNOSIS — K3184 Gastroparesis: Secondary | ICD-10-CM | POA: Insufficient documentation

## 2015-09-05 DIAGNOSIS — Z792 Long term (current) use of antibiotics: Secondary | ICD-10-CM | POA: Diagnosis not present

## 2015-09-05 DIAGNOSIS — Z79899 Other long term (current) drug therapy: Secondary | ICD-10-CM | POA: Diagnosis not present

## 2015-09-05 LAB — CBC
HCT: 40.5 % (ref 39.0–52.0)
Hemoglobin: 14.4 g/dL (ref 13.0–17.0)
MCH: 32.3 pg (ref 26.0–34.0)
MCHC: 35.6 g/dL (ref 30.0–36.0)
MCV: 90.8 fL (ref 78.0–100.0)
Platelets: 159 10*3/uL (ref 150–400)
RBC: 4.46 MIL/uL (ref 4.22–5.81)
RDW: 13 % (ref 11.5–15.5)
WBC: 9.1 10*3/uL (ref 4.0–10.5)

## 2015-09-05 LAB — COMPREHENSIVE METABOLIC PANEL
ALT: 20 U/L (ref 17–63)
AST: 23 U/L (ref 15–41)
Albumin: 4 g/dL (ref 3.5–5.0)
Alkaline Phosphatase: 56 U/L (ref 38–126)
Anion gap: 12 (ref 5–15)
BUN: 10 mg/dL (ref 6–20)
CO2: 23 mmol/L (ref 22–32)
Calcium: 9.4 mg/dL (ref 8.9–10.3)
Chloride: 104 mmol/L (ref 101–111)
Creatinine, Ser: 1.04 mg/dL (ref 0.61–1.24)
GFR calc Af Amer: >60 mL/min (ref >60)
GFR calc non Af Amer: >60 mL/min (ref >60)
Glucose, Bld: 220 mg/dL — ABNORMAL HIGH (ref 65–99)
Potassium: 3.9 mmol/L (ref 3.5–5.1)
Sodium: 139 mmol/L (ref 135–145)
Total Bilirubin: 1 mg/dL (ref 0.3–1.2)
Total Protein: 7.4 g/dL (ref 6.5–8.1)

## 2015-09-05 LAB — LIPASE, BLOOD: Lipase: 25 U/L (ref 11–51)

## 2015-09-05 MED ORDER — SODIUM CHLORIDE 0.9 % IV BOLUS (SEPSIS)
1000.0000 mL | Freq: Once | INTRAVENOUS | Status: AC
  Administered 2015-09-05: 1000 mL via INTRAVENOUS

## 2015-09-05 MED ORDER — METOCLOPRAMIDE HCL 5 MG/ML IJ SOLN
10.0000 mg | Freq: Once | INTRAMUSCULAR | Status: AC
  Administered 2015-09-05: 10 mg via INTRAVENOUS
  Filled 2015-09-05: qty 2

## 2015-09-05 MED ORDER — HALOPERIDOL LACTATE 5 MG/ML IJ SOLN
2.0000 mg | Freq: Once | INTRAMUSCULAR | Status: AC
  Administered 2015-09-05: 2 mg via INTRAVENOUS
  Filled 2015-09-05: qty 1

## 2015-09-05 MED ORDER — MORPHINE SULFATE (PF) 4 MG/ML IV SOLN
4.0000 mg | Freq: Once | INTRAVENOUS | Status: AC
  Administered 2015-09-05: 4 mg via INTRAVENOUS
  Filled 2015-09-05: qty 1

## 2015-09-05 MED ORDER — LORAZEPAM 2 MG/ML IJ SOLN
1.0000 mg | Freq: Once | INTRAMUSCULAR | Status: AC
  Administered 2015-09-05: 1 mg via INTRAVENOUS
  Filled 2015-09-05: qty 1

## 2015-09-05 NOTE — ED Provider Notes (Signed)
CSN: [id number]     Arrival date & time 09/05/15  1501 History   First MD Initiated Contact with Patient 09/05/15 1550     Chief Complaint  Patient presents with  . Abdominal Pain     The history is provided by the patient and medical records.   patient brought to the emergency department complaining of severe nausea and vomiting that began yesterday.  He has long-standing history of gastroparesis.  No diarrhea.  No fevers or chills.  Denies hematemesis.  Reports upper abdominal cramping.  Reports symptoms are moderate to severe in severity.  He's tried his medications at home which includes Carafate without improvement in his symptoms.    Past Medical History  Diagnosis Date  . Type 2 diabetes mellitus with complications ([place])     diagnosed around age 45  . Glaucoma   . Eye hemorrhage   . Gastritis   . GERD (gastroesophageal reflux disease)   . Esophagitis   . Hiccups   . Helicobacter pylori (H. pylori) infection   . Hypertension   . Gastroparesis   . Chronic abdominal pain   . Nausea and vomiting     chronic, recurrent   Past Surgical History  Procedure Laterality Date  . Eye surgery    . Esophagogastroduodenoscopy  2015    Dr. Britta Mccreedy  . Esophagogastroduodenoscopy N/A 10/26/2014    RMR: Distal esophagititis-likely reflux related although an element of pill induced injuruy no exclueded  status post biopsy. Diffusely abnormal gastric mucosa of uncertain signigicane -status post gastric biopsy. Focal area of excoriation in the cardia most consistant with a trauma of heaving.   . Eye surgery     Family History  Problem Relation Age of Onset  . Ovarian cancer Mother   . Cervical cancer Sister   . Diabetes Sister   . Colon cancer Neg Hx    Social History  Substance Use Topics  . Smoking status: Never Smoker   . Smokeless tobacco: Never Used  . Alcohol Use: No    Review of Systems  All other systems reviewed and are negative.     Allergies  Donnatal; Fish  allergy; Haldol; Lidocaine; Maalox; Aspirin; Bactrim; Doxycycline; Penicillins; Phenergan; and Zofran  Home Medications   Prior to Admission medications   Medication Sig Start Date End Date Taking? Authorizing Provider  baclofen (LIORESAL) 10 MG tablet Take 5 mg by mouth 2 (two) times daily. 1/46/17  Yes Historical Provider, MD  brimonidine-timolol (COMBIGAN) 0.2-0.5 % ophthalmic solution Place 1 drop into both eyes at bedtime.   Yes Historical Provider, MD  erythromycin (E-MYCIN) 250 MG tablet Take 250 mg by mouth daily.   Yes Historical Provider, MD  insulin aspart protamine- aspart (NOVOLOG MIX 70/30) (70-30) 100 UNIT/ML injection Inject 0.15 mLs (15 Units total) into the skin 3 (three) times daily. Patient taking differently: Inject 35 Units into the skin 3 (three) times daily.  02/20/15  Yes Orvan Falconer, MD  insulin detemir (LEVEMIR) 100 UNIT/ML injection Inject 30 Units into the skin at bedtime.   Yes Historical Provider, MD  lisinopril-hydrochlorothiazide (PRINZIDE,ZESTORETIC) 20-25 MG tablet Take 1 tablet by mouth daily.   Yes Historical Provider, MD  metoCLOPramide (REGLAN) 10 MG tablet Take 1 tablet (10 mg total) by mouth 3 (three) times daily as needed for nausea (headache / nausea). 07/23/15  Yes Noemi Chapel, MD  pantoprazole (PROTONIX) 40 MG tablet Take 1 tablet (40 mg total) by mouth 2 (two) times daily before a meal. 06/14/15  Yes Magda Paganini  S Lewis, PA-C  sucralfate (CARAFATE) 1 g tablet TAKE ONE TABLET BY MOUTH 4 TIMES DAILY 08/31/15  Yes Orvil Feil, NP  gabapentin (NEURONTIN) 600 MG tablet Take 600 mg by mouth daily as needed (for neuropathy).     Historical Provider, MD  hydroxypropyl methylcellulose / hypromellose (ISOPTO TEARS / GONIOVISC) 2.5 % ophthalmic solution Place 1 drop into both eyes as needed for dry eyes.    Historical Provider, MD   BP 204/119 mmHg  Pulse 92  Temp(Src) 98.3 F (36.8 C) (Oral)  Resp 16  Ht 6' (1.829 m)  Wt 148 lb (67.132 kg)  BMI 20.07 kg/m2  SpO2  100% Physical Exam  Constitutional: He is oriented to person, place, and time. He appears well-developed and well-nourished.  HENT:  Head: Normocephalic and atraumatic.  Eyes: EOM are normal.  Neck: Normal range of motion.  Cardiovascular: Normal rate and regular rhythm.   Pulmonary/Chest: Effort normal. No respiratory distress.  Abdominal: Soft. He exhibits no distension. There is no tenderness.  Musculoskeletal: Normal range of motion.  Neurological: He is alert and oriented to person, place, and time.  Skin: Skin is warm and dry.  Psychiatric: He has a normal mood and affect. Judgment normal.  Nursing note and vitals reviewed.   ED Course  Procedures (including critical care time) Labs Review Labs Reviewed  COMPREHENSIVE METABOLIC PANEL - Abnormal; Notable for the following:    Glucose, Bld 220 (*)    All other components within normal limits  CBC  LIPASE, BLOOD    Imaging Review No results found. I have personally reviewed and evaluated these images and lab results as part of my medical decision-making.   EKG Interpretation None      MDM   Final diagnoses:  Gastroparesis    7:02 PM Patient feels much better this time.  Repeat abdominal exam benign.  Likely flare of his gastroparesis.  Hydrated in the emergency department.  Discharge home with primary care and GI follow-up.  Labs without significant abnormality.    Jola Schmidt, MD 09/05/15 [phone]

## 2015-09-05 NOTE — Discharge Instructions (Signed)
Gastroparesis °Gastroparesis, also called delayed gastric emptying, is a condition in which food takes longer than normal to empty from the stomach. The condition is usually long-lasting (chronic). °CAUSES °This condition may be caused by: °· An endocrine disorder, such as hypothyroidism or diabetes. Diabetes is the most common cause of this condition. °· A nervous system disease, such as Parkinson disease or multiple sclerosis. °· Cancer, infection, or surgery of the stomach or vagus nerve. °· A connective tissue disorder, such as scleroderma. °· Certain medicines. °In most cases, the cause is not known. °RISK FACTORS °This condition is more likely to develop in: °· People with certain disorders, including endocrine disorders, eating disorders, amyloidosis, and scleroderma. °· People with certain diseases, including Parkinson disease or multiple sclerosis. °· People with cancer or infection of the stomach or vagus nerve. °· People who have had surgery on the stomach or vagus nerve. °· People who take certain medicines. °· Women. °SYMPTOMS °Symptoms of this condition include: °· An early feeling of fullness when eating. °· Nausea. °· Weight loss. °· Vomiting. °· Heartburn. °· Abdominal bloating. °· Inconsistent blood glucose levels. °· Lack of appetite. °· Acid from the stomach coming up into the esophagus (gastroesophageal reflux). °· Spasms of the stomach. °Symptoms may come and go. °DIAGNOSIS °This condition is diagnosed with tests, such as: °· Tests that check how long it takes food to move through the stomach and intestines. These tests include: °¨ Upper gastrointestinal (GI) series. In this test, X-rays of the intestines are taken after you drink a liquid. The liquid makes the intestines show up better on the X-rays. °¨ Gastric emptying scintigraphy. In this test, scans are taken after you eat food that contains a small amount of radioactive material. °¨ Wireless capsule GI monitoring system. This test  involves swallowing a capsule that records information about movement through the stomach. °· Gastric manometry. This test measures electrical and muscular activity in the stomach. It is done with a thin tube that is passed down the throat and into the stomach. °· Endoscopy. This test checks for abnormalities in the lining of the stomach. It is done with a long, thin tube that is passed down the throat and into the stomach. °· An ultrasound. This test can help rule out gallbladder disease or pancreatitis as a cause of your symptoms. It uses sound waves to take pictures of the inside of your body. °TREATMENT °There is no cure for gastroparesis. This condition may be managed with: °· Treatment of the underlying condition causing the gastroparesis. °· Lifestyle changes, including exercise and dietary changes. Dietary changes can include: °¨ Changes in what and when you eat. °¨ Eating smaller meals more often. °¨ Eating low-fat foods. °¨ Eating low-fiber forms of high-fiber foods, such as cooked vegetables instead of raw vegetables. °¨ Having liquid foods in place of solid foods. Liquid foods are easier to digest. °· Medicines. These may be given to control nausea and vomiting and to stimulate stomach muscles. °· Getting food through a feeding tube. This may be done in severe cases. °· A gastric neurostimulator. This is a device that is inserted into the body with surgery. It helps improve stomach emptying and control nausea and vomiting. °HOME CARE INSTRUCTIONS °· Follow your health care provider's instructions about exercise and diet. °· Take medicines only as directed by your health care provider. °SEEK MEDICAL CARE IF: °· Your symptoms do not improve with treatment. °· You have new symptoms. °SEEK IMMEDIATE MEDICAL CARE IF: °· You have   severe abdominal pain that does not improve with treatment. °· You have nausea that does not go away. °· You cannot keep fluids down. °  °This information is not intended to replace  advice given to you by your health care provider. Make sure you discuss any questions you have with your health care provider. °  °Document Released: 07/28/2005 Document Revised: 12/12/2014 Document Reviewed: 07/24/2014 °Elsevier Interactive Patient Education ©2016 [employer] ° °

## 2015-09-05 NOTE — ED Notes (Signed)
Pt resting quietly.

## 2015-09-05 NOTE — ED Notes (Signed)
Pt reports abdominal pain and n/v that began yesterday. Pt hx of gastroparesis. Denies diarrhea.

## 2015-09-07 ENCOUNTER — Emergency Department (HOSPITAL_COMMUNITY)
Admission: EM | Admit: 2015-09-07 | Discharge: 2015-09-08 | Disposition: A | Discharge status: Home / Self Care | Payer: Medicaid Other | Attending: Emergency Medicine | Admitting: Emergency Medicine

## 2015-09-07 ENCOUNTER — Telehealth: Payer: Self-pay | Admitting: Nutrition

## 2015-09-07 ENCOUNTER — Emergency Department (HOSPITAL_COMMUNITY)
Admission: EM | Admit: 2015-09-07 | Discharge: 2015-09-07 | Disposition: A | Discharge status: Home / Self Care | Payer: Medicaid Other | Source: Home / Self Care | Attending: Emergency Medicine | Admitting: Emergency Medicine

## 2015-09-07 ENCOUNTER — Encounter (HOSPITAL_COMMUNITY): Payer: Self-pay

## 2015-09-07 ENCOUNTER — Encounter (HOSPITAL_COMMUNITY): Payer: Self-pay | Admitting: *Deleted

## 2015-09-07 DIAGNOSIS — G8929 Other chronic pain: Secondary | ICD-10-CM | POA: Insufficient documentation

## 2015-09-07 DIAGNOSIS — Z792 Long term (current) use of antibiotics: Secondary | ICD-10-CM | POA: Insufficient documentation

## 2015-09-07 DIAGNOSIS — R1013 Epigastric pain: Secondary | ICD-10-CM

## 2015-09-07 DIAGNOSIS — K3184 Gastroparesis: Secondary | ICD-10-CM | POA: Insufficient documentation

## 2015-09-07 DIAGNOSIS — R112 Nausea with vomiting, unspecified: Secondary | ICD-10-CM

## 2015-09-07 DIAGNOSIS — Z794 Long term (current) use of insulin: Secondary | ICD-10-CM | POA: Insufficient documentation

## 2015-09-07 DIAGNOSIS — K219 Gastro-esophageal reflux disease without esophagitis: Secondary | ICD-10-CM | POA: Insufficient documentation

## 2015-09-07 DIAGNOSIS — Z8619 Personal history of other infectious and parasitic diseases: Secondary | ICD-10-CM

## 2015-09-07 DIAGNOSIS — R1114 Bilious vomiting: Secondary | ICD-10-CM

## 2015-09-07 DIAGNOSIS — E119 Type 2 diabetes mellitus without complications: Secondary | ICD-10-CM | POA: Insufficient documentation

## 2015-09-07 DIAGNOSIS — Z880 Allergy status to penicillin: Secondary | ICD-10-CM

## 2015-09-07 DIAGNOSIS — I10 Essential (primary) hypertension: Secondary | ICD-10-CM

## 2015-09-07 DIAGNOSIS — H409 Unspecified glaucoma: Secondary | ICD-10-CM

## 2015-09-07 DIAGNOSIS — Z79899 Other long term (current) drug therapy: Secondary | ICD-10-CM

## 2015-09-07 DIAGNOSIS — R109 Unspecified abdominal pain: Principal | ICD-10-CM

## 2015-09-07 LAB — COMPREHENSIVE METABOLIC PANEL
ALT: 15 U/L — ABNORMAL LOW (ref 17–63)
AST: 17 U/L (ref 15–41)
Albumin: 3.8 g/dL (ref 3.5–5.0)
Alkaline Phosphatase: 46 U/L (ref 38–126)
Anion gap: 10 (ref 5–15)
BUN: 10 mg/dL (ref 6–20)
CO2: 25 mmol/L (ref 22–32)
Calcium: 9.2 mg/dL (ref 8.9–10.3)
Chloride: 103 mmol/L (ref 101–111)
Creatinine, Ser: 0.98 mg/dL (ref 0.61–1.24)
GFR calc Af Amer: >60 mL/min (ref >60)
GFR calc non Af Amer: >60 mL/min (ref >60)
Glucose, Bld: 218 mg/dL — ABNORMAL HIGH (ref 65–99)
Potassium: 3.7 mmol/L (ref 3.5–5.1)
Sodium: 138 mmol/L (ref 135–145)
Total Bilirubin: 1.2 mg/dL (ref 0.3–1.2)
Total Protein: 7.1 g/dL (ref 6.5–8.1)

## 2015-09-07 LAB — CBC WITH DIFFERENTIAL/PLATELET
Basophils Absolute: 0 10*3/uL (ref 0.0–0.1)
Basophils Relative: 0 %
Eosinophils Absolute: 0 10*3/uL (ref 0.0–0.7)
Eosinophils Relative: 0 %
HCT: 41.3 % (ref 39.0–52.0)
Hemoglobin: 14.5 g/dL (ref 13.0–17.0)
Lymphocytes Relative: 15 %
Lymphs Abs: 1.3 10*3/uL (ref 0.7–4.0)
MCH: 32.1 pg (ref 26.0–34.0)
MCHC: 35.1 g/dL (ref 30.0–36.0)
MCV: 91.4 fL (ref 78.0–100.0)
Monocytes Absolute: 0.5 10*3/uL (ref 0.1–1.0)
Monocytes Relative: 6 %
Neutro Abs: 6.8 10*3/uL (ref 1.7–7.7)
Neutrophils Relative %: 79 %
Platelets: 134 10*3/uL — ABNORMAL LOW (ref 150–400)
RBC: 4.52 MIL/uL (ref 4.22–5.81)
RDW: 13.1 % (ref 11.5–15.5)
WBC: 8.6 10*3/uL (ref 4.0–10.5)

## 2015-09-07 LAB — URINALYSIS, ROUTINE W REFLEX MICROSCOPIC
Bilirubin Urine: NEGATIVE
Glucose, UA: NEGATIVE mg/dL
Leukocytes, UA: NEGATIVE
Nitrite: NEGATIVE
Protein, ur: 100 mg/dL — AB
Specific Gravity, Urine: 1.025 (ref 1.005–1.030)
pH: 7 (ref 5.0–8.0)

## 2015-09-07 LAB — URINE MICROSCOPIC-ADD ON
Bacteria, UA: NONE SEEN
Squamous Epithelial / LPF: NONE SEEN
WBC, UA: NONE SEEN WBC/hpf (ref 0–5)

## 2015-09-07 MED ORDER — LORAZEPAM 2 MG/ML IJ SOLN
1.0000 mg | Freq: Once | INTRAMUSCULAR | Status: AC
  Administered 2015-09-07: 1 mg via INTRAVENOUS
  Filled 2015-09-07: qty 1

## 2015-09-07 MED ORDER — DIPHENHYDRAMINE HCL 50 MG/ML IJ SOLN
25.0000 mg | Freq: Once | INTRAMUSCULAR | Status: AC
  Administered 2015-09-07: 25 mg via INTRAVENOUS
  Filled 2015-09-07: qty 1

## 2015-09-07 MED ORDER — FAMOTIDINE IN NACL 20-0.9 MG/50ML-% IV SOLN
20.0000 mg | Freq: Once | INTRAVENOUS | Status: AC
  Administered 2015-09-07: 20 mg via INTRAVENOUS
  Filled 2015-09-07: qty 50

## 2015-09-07 MED ORDER — METOCLOPRAMIDE HCL 5 MG/ML IJ SOLN
10.0000 mg | Freq: Once | INTRAMUSCULAR | Status: AC
  Administered 2015-09-07: 10 mg via INTRAVENOUS
  Filled 2015-09-07: qty 2

## 2015-09-07 MED ORDER — SODIUM CHLORIDE 0.9 % IV BOLUS (SEPSIS)
1000.0000 mL | Freq: Once | INTRAVENOUS | Status: AC
  Administered 2015-09-07: 1000 mL via INTRAVENOUS

## 2015-09-07 MED ORDER — SUCRALFATE 1 GM/10ML PO SUSP: 1.0000 g | Freq: Three times a day (TID) | ORAL | Status: DC

## 2015-09-07 MED ORDER — METOCLOPRAMIDE HCL 10 MG PO TABS: 10.0000 mg | ORAL_TABLET | Freq: Three times a day (TID) | ORAL | Status: DC

## 2015-09-07 NOTE — ED Notes (Signed)
Pt was discharged from here several hours ago. Pt c/o continued abdominal pain and vomiting.

## 2015-09-07 NOTE — Telephone Encounter (Signed)
Vm to call and reschedule missed appt.

## 2015-09-07 NOTE — ED Notes (Signed)
Pt reports history of gastroparesis.  C/O abd pain and vomiting since this morning.

## 2015-09-07 NOTE — ED Notes (Signed)
Pt made aware to return if symptoms worsen or if any life threatening symptoms occur.   

## 2015-09-07 NOTE — Discharge Instructions (Signed)
°Emergency Department Resource Guide °1) Find a Doctor and Pay Out of Pocket °Although you won't have to find out who is covered by your insurance plan, it is a good idea to ask around and get recommendations. You will then need to call the office and see if the doctor you have chosen will accept you as a new patient and what types of options they offer for patients who are self-pay. Some doctors offer discounts or will set up payment plans for their patients who do not have insurance, but you will need to ask so you aren't surprised when you get to your appointment. ° °2) Contact Your Local Health Department °Not all health departments have doctors that can see patients for sick visits, but many do, so it is worth a call to see if yours does. If you don't know where your local health department is, you can check in your phone book. The CDC also has a tool to help you locate your state's health department, and many state websites also have listings of all of their local health departments. ° °3) Find a Walk-in Clinic °If your illness is not likely to be very severe or complicated, you may want to try a walk in clinic. These are popping up all over the country in pharmacies, drugstores, and shopping centers. They're usually staffed by nurse practitioners or physician assistants that have been trained to treat common illnesses and complaints. They're usually fairly quick and inexpensive. However, if you have serious medical issues or chronic medical problems, these are probably not your best option. ° °No Primary Care Doctor: °- Call [facility] at  [phone] - they can help you locate a primary care doctor that  accepts your insurance, provides certain services, etc. °- Physician Referral Service- 1-800-533-3463 ° °Chronic Pain Problems: °Organization         Address  Phone   Notes  °[facility]  ([phone]) [phone] Patients need to be referred by their primary care doctor.  ° °Medication  Assistance: °Organization         Address  Phone   Notes  °[place] [facility] [address] °[place], [place] [address] (336) 641-8030 --Must be a resident of [place] °-- Must have NO insurance coverage whatsoever (no Medicaid/ Medicare, etc.) °-- The pt. MUST have a primary care doctor that directs their care regularly and follows them in the community °  °[facility]  (866) 331-1348   °[employer]  (888) 892-1162   ° °Agencies that provide inexpensive medical care: °Organization         Address  Phone   Notes  °[facility]  (336) 832-8035   °[facility]    (336) 832-7272   °[facility] [address] °[place], [place] [address] (336) 832-4777   °[facility] [address], °[place] (336) 271-4999   °[facility]    (336) 373-0678   °[facility]    (336) 272-1050   °[facility] ° [address],  Phone:  (336) 832-4444, Fax:  (336) 832-4440 Hours of Operation:  9 am - 6 pm, M-F.  Also accepts Medicaid/Medicare and self-pay.  °[facility] ° [address],  Phone: (336) 832-3150, Fax: (336) [phone]. Hours of Operation:  8:30 am - 5:30 pm, M-F.  Also accepts Medicaid and self-pay.  °[facility] [place] [address], [place] Phone: (336) 878-6027   °[facility] [address], [place], [place] (336)723-1848, Ext. 123 Mondays & Thursdays: 7-9 AM.  First 15 patients are seen on a first come, first serve basis. °  ° °Medicaid-accepting [place] Providers: ° °Organization         Address  Phone   Notes  °[facility] [address], [place] (336) 641-2100 Also accepts self-pay patients.  °[facility] [address], [place] ° (336) 856-9996   °[facility] [address], [place]  (336) 288-8857   °[facility] [address] [place] [address], [place] (336) 299-7000   °Veita Bland [address], [place]  ° (336) 373-1557 Only accepts [place] Access Medicaid patients after they have their name applied to their card.  ° °Self-Pay (no insurance) in [place]: ° °Organization         Address  Phone   Notes  °Sickle Cell Patients, [facility] [address], [place] (336) 832-1970   °[facility] [address], [place] (336) 832-4400   °[facility] ° [address], [place] (336) 992-4800   °[facility]/Dr. Osei-Bonsu ° [address] [place] [address], [place] or [address], [place] (336) [phone] Phone number for both [place] and [place] locations is the same.  °[facility] [address], [place] (336) 299-0000   °[facility] [address] [place] [address], [place] or [address] (336) 852-7530 °(336) 878-2260   °[facility] [address], [place] (336) 350-1642, phone; (336) 294-5005, fax Sees patients 1st and 3rd Saturday of every month.  Must not qualify for public or private insurance (i.e. Medicaid, Medicare, [place] Health Choice, Veterans' Benefits) • Household income should be no more than 200% of the poverty level •The clinic cannot treat you if you are pregnant or think you are pregnant • Sexually transmitted diseases are not treated at the clinic.  ° ° °Dental Care: °Organization         Address  Phone  Notes  °[place] [facility] [address], [place] (336) 641-6152 Accepts children up to age 21 who are enrolled in Medicaid or [place] Health Choice; pregnant women with a Medicaid card; and children who have applied for Medicaid or [place] Health Choice, but were declined, whose parents can pay a reduced fee at time of service.  °[place]  [facility] [place]  [address], [place] (336) 641-7733 Accepts children up to age 21 who are enrolled in Medicaid or [place] Health Choice; pregnant women with a Medicaid card; and children who have applied for Medicaid or [place] Health Choice, but were declined, whose parents can pay a reduced fee at time of service.  °[facility] ° [address], [place] (336) 641-4533 Patients are seen by appointment only. Walk-ins are not accepted. [facility] will see patients 18 years of age and older. °Monday - Tuesday (8am-5pm) °Most Wednesdays (8:30-5pm) °$30 per visit, cash only  °[facility] ° [address], [place] (336) 641-4533 Patients are seen by appointment only. Walk-ins are not accepted. [facility] will see patients 18 years of age and older. °One   Wednesday Evening (Monthly: Volunteer Based).  $30 per visit, cash only  °[facility]  (919) [phone] for adults; Children under age 4, call Graduate Pediatric Dentistry at ([phone]) [phone]. Children aged 4-14, please call ([phone]) [phone] to request a pediatric application. ° Dental services are provided in all areas of dental care including fillings, crowns and bridges, complete and partial dentures, implants, gum treatment, root canals, and extractions. Preventive care is also provided. Treatment is provided to both adults and children. °Patients are selected via a lottery and there is often a waiting list. °  °[facility] [address], °[place] ° (336) 763-8833 [email or web address].[email or web address] °  °[facility] [address], [place], [place] (336)723-1848, Ext. 123 Second and Fourth Thursday of each month, opens at 6:30 AM; Clinic ends at 9 AM.  Patients are seen on a first-come first-served basis, and a limited number are seen during each clinic.  ° °[facility] ° [address], [place], [place] (336) [phone]    Eligibility Requirements °You must have lived in [place], or [place] counties for at least the last three months. °  You cannot be eligible for state or federal sponsored healthcare insurance, including Veterans Administration, Medicaid, or Medicare. °  You generally cannot be eligible for healthcare insurance through your employer.  °  How to apply: °Eligibility screenings are held every Tuesday and Wednesday afternoon from 1:00 pm until 4:00 pm. You do not need an appointment for the interview!  °[facility] [address], [place], [place] 336-631-2330   °[facility]  336-342-8273   °[facility]  336-703-3100   °[facility]  336-570-6415   ° °Behavioral Health Resources in the Community: °Intensive Outpatient Programs °Organization         Address  Phone  Notes  °[facility] [address], [place], [place] 336-878-6098   °[facility] [address], [place], [place] 336-832-9800   °[facility] [address], [place], [place] ° 336-882-2125   °[facility] [address],  °[place], [place] 1-800-853-5163 or 336-641-4981   °Substance Abuse Resources °Organization         Address  Phone  Notes  °[facility]  336-882-2125   °[facility]  336-784-9470   °[facility]  336-285-9073   °[facility]  336-845-3988   °Residential & Outpatient Substance Abuse Program  1-800-659-3381   °Psychological Services °Organization         Address  Phone  Notes  °[facility]  336- 832-9600   °[facility]  336- 378-7881   °[facility] [address], [place] 1-800-853-5163 or 336-641-4981   ° °Mobile Crisis Teams °Organization         Address  Phone  Notes  °[facility], Mobile Crisis Care Unit  1-877-626-1772   °Assertive °[facility] ° [address]  [place], [place] 336-834-9664   °Sharon DeEsch [address] °[place] 336-554-5454   ° °Self-Help/Support Groups °Organization         Address  Phone             Notes  °[facility]  - variety of support groups  336- 373-1402 Call for more information  °Narcotics Anonymous (NA), [facility] [address], °[place]  2 meetings at this location  ° °  Residential Treatment Programs Organization         Address  Phone  Notes  [facility] [address],    [place]  1-(956) 048-0774   [facility]  [address], [place] [id number], [place], [place]   [place], [place] [phone] Admissions: 8am-3pm M-F  [facility] [address],    [place], [place] [id number]   [facility] [address] [place], [place], [place]   [facility] [address]  [place], [place]   [facility] Intensive Outpatient [address], [place], [place]   [facility] [address]  [place], [place] 1-(343)363-0504 or [phone]   [facility] [address] [place], [place] Accepts Medicaid  [facility] [address],  [place] 1-(360) 031-9188 Substance Abuse/Addiction Treatment   [facility] Organization         Address  Phone  Notes  [facility]  941-258-0065   Domenic Schwab, PhD [address] [place], [place]   218-106-6468 or 909-386-5840   [place], [place] [phone]   [facility] [address], [place], [place] ([phone] Insurance/Medicaid/sponsorship through [facility] and Families [address], [address] [place]                                    [place], [place] [phone] [place] [address] [place], [place] [phone]) [phone]    Dr. Adele Schilder  [phone]   [facility] 1) [address] [place] 2) [place] 3)  [address], [place] [phone]  [phone]   [place] [phone] or [phone] (After Hours)      Take the prescription as directed. Follow your gastroparesis diet and take the medications prescribed by your GI doctor. Call your regular medical doctor and your GI doctor today to schedule a follow up appointment within the next 3 days.  Return to the Emergency Department immediately sooner if worsening.

## 2015-09-07 NOTE — ED Provider Notes (Signed)
CSN: [id number]     Arrival date & time 09/07/15  1109 History   First MD Initiated Contact with Patient 09/07/15 1309     Chief Complaint  Patient presents with  . Abdominal Pain  . Emesis      HPI Pt was seen at 1315.  Per pt, c/o gradual onset and persistence of constant acute flair of his chronic upper abd "pain" since this morning.  Has been associated with multiple intermittent episodes of N/V.  Describes the abd pain as "cramping" and per his usual chronic pain pattern.  Denies diarrhea, no fevers, no back pain, no rash, no CP/SOB, no black or blood in stools or emesis.  The symptoms have been associated with no other complaints. The patient has a significant history of similar symptoms previously, recently being evaluated for this complaint and multiple prior evals for same. Pt has had 6 ED evaluations this month for his symptoms (this ED and [facility]).    Past Medical History  Diagnosis Date  . Type 2 diabetes mellitus with complications ([place])     diagnosed around age 77  . Glaucoma   . Eye hemorrhage   . Gastritis   . GERD (gastroesophageal reflux disease)   . Esophagitis   . Hiccups   . Helicobacter pylori (H. pylori) infection   . Hypertension   . Gastroparesis   . Chronic abdominal pain   . Nausea and vomiting     chronic, recurrent   Past Surgical History  Procedure Laterality Date  . Eye surgery    . Esophagogastroduodenoscopy  2015    Dr. Britta Mccreedy  . Esophagogastroduodenoscopy N/A 10/26/2014    RMR: Distal esophagititis-likely reflux related although an element of pill induced injuruy no exclueded  status post biopsy. Diffusely abnormal gastric mucosa of uncertain signigicane -status post gastric biopsy. Focal area of excoriation in the cardia most consistant with a trauma of heaving.   . Eye surgery     Family History  Problem Relation Age of Onset  . Ovarian cancer Mother   . Cervical cancer Sister   . Diabetes Sister   . Colon cancer Neg Hx    Social  History  Substance Use Topics  . Smoking status: Never Smoker   . Smokeless tobacco: Never Used  . Alcohol Use: No    Review of Systems ROS: Statement: All systems negative except as marked or noted in the HPI; Constitutional: Negative for fever and chills. ; ; Eyes: Negative for eye pain, redness and discharge. ; ; ENMT: Negative for ear pain, hoarseness, nasal congestion, sinus pressure and sore throat. ; ; Cardiovascular: Negative for chest pain, palpitations, diaphoresis, dyspnea and peripheral edema. ; ; Respiratory: Negative for cough, wheezing and stridor. ; ; Gastrointestinal: +N/V, abd pain. Negative for diarrhea, blood in stool, hematemesis, jaundice and rectal bleeding. . ; ; Genitourinary: Negative for dysuria, flank pain and hematuria. ; ; Musculoskeletal: Negative for back pain and neck pain. Negative for swelling and trauma.; ; Skin: Negative for pruritus, rash, abrasions, blisters, bruising and skin lesion.; ; Neuro: Negative for headache, lightheadedness and neck stiffness. Negative for weakness, altered level of consciousness , altered mental status, extremity weakness, paresthesias, involuntary movement, seizure and syncope.        Allergies  Donnatal; Fish allergy; Haldol; Lidocaine; Maalox; Aspirin; Bactrim; Doxycycline; Penicillins; Phenergan; and Zofran  Home Medications   Prior to Admission medications   Medication Sig Start Date End Date Taking? Authorizing Provider  baclofen (LIORESAL) 10 MG tablet Take 5  mg by mouth 2 (two) times daily. 08/20/15  Yes Historical Provider, MD  brimonidine-timolol (COMBIGAN) 0.2-0.5 % ophthalmic solution Place 1 drop into both eyes at bedtime.   Yes Historical Provider, MD  erythromycin (E-MYCIN) 250 MG tablet Take 250 mg by mouth daily.   Yes Historical Provider, MD  gabapentin (NEURONTIN) 600 MG tablet Take 600 mg by mouth daily as needed (for neuropathy).    Yes Historical Provider, MD  hydroxypropyl methylcellulose / hypromellose  (ISOPTO TEARS / GONIOVISC) 2.5 % ophthalmic solution Place 1 drop into both eyes as needed for dry eyes.   Yes Historical Provider, MD  insulin aspart protamine- aspart (NOVOLOG MIX 70/30) (70-30) 100 UNIT/ML injection Inject 0.15 mLs (15 Units total) into the skin 3 (three) times daily. Patient taking differently: Inject 35 Units into the skin 3 (three) times daily.  02/20/15  Yes Orvan Falconer, MD  insulin detemir (LEVEMIR) 100 UNIT/ML injection Inject 30 Units into the skin at bedtime.   Yes Historical Provider, MD  lisinopril-hydrochlorothiazide (PRINZIDE,ZESTORETIC) 20-25 MG tablet Take 1 tablet by mouth daily.   Yes Historical Provider, MD  metoCLOPramide (REGLAN) 10 MG tablet Take 1 tablet (10 mg total) by mouth 3 (three) times daily as needed for nausea (headache / nausea). 07/23/15  Yes Noemi Chapel, MD  pantoprazole (PROTONIX) 40 MG tablet Take 1 tablet (40 mg total) by mouth 2 (two) times daily before a meal. 06/14/15  Yes Mahala Menghini, PA-C  sucralfate (CARAFATE) 1 g tablet TAKE ONE TABLET BY MOUTH 4 TIMES DAILY 08/31/15  Yes Orvil Feil, NP   BP 130/72 mmHg  Pulse 68  Temp(Src) 99.1 F (37.3 C) (Oral)  Resp 14  Ht 6' (1.829 m)  Wt 258 lb (117.028 kg)  BMI 34.98 kg/m2  SpO2 100% Physical Exam  1320: Physical examination:  Nursing notes reviewed; Vital signs and O2 SAT reviewed;  Constitutional: Well developed, Well nourished, Well hydrated, In no acute distress; Head:  Normocephalic, atraumatic; Eyes: EOMI, PERRL, No scleral icterus; ENMT: Mouth and pharynx normal, Mucous membranes moist; Neck: Supple, Full range of motion, No lymphadenopathy; Cardiovascular: Regular rate and rhythm, No gallop; Respiratory: Breath sounds clear & equal bilaterally, No wheezes.  Speaking full sentences with ease, Normal respiratory effort/excursion; Chest: Nontender, Movement normal; Abdomen: Soft, +epigastric tenderness to palp. No rebound or guarding. Nondistended, Normal bowel sounds; Genitourinary: No CVA  tenderness; Extremities: Pulses normal, No tenderness, No edema, No calf edema or asymmetry.; Neuro: AA&Ox3, Major CN grossly intact.  Speech clear. No gross focal motor or sensory deficits in extremities.; Skin: Color normal, Warm, Dry.   ED Course  Procedures (including critical care time) Labs Review   Imaging Review  I have personally reviewed and evaluated these images and lab results as part of my medical decision-making.   EKG Interpretation None      MDM  MDM Reviewed: previous chart, nursing note and vitals Reviewed previous: labs Interpretation: labs     Results for orders placed or performed during the hospital encounter of 09/07/15  Urinalysis, Routine w reflex microscopic (not at [facility])  Result Value Ref Range   Color, Urine YELLOW YELLOW   APPearance CLEAR CLEAR   Specific Gravity, Urine 1.025 1.005 - 1.030   pH 7.0 5.0 - 8.0   Glucose, UA NEGATIVE NEGATIVE mg/dL   Hgb urine dipstick SMALL (A) NEGATIVE   Bilirubin Urine NEGATIVE NEGATIVE   Ketones, ur TRACE (A) NEGATIVE mg/dL   Protein, ur 100 (A) NEGATIVE mg/dL   Nitrite NEGATIVE  NEGATIVE   Leukocytes, UA NEGATIVE NEGATIVE  CBC with Differential  Result Value Ref Range   WBC 8.6 4.0 - 10.5 K/uL   RBC 4.52 4.22 - 5.81 MIL/uL   Hemoglobin 14.5 13.0 - 17.0 g/dL   HCT 41.3 39.0 - 52.0 %   MCV 91.4 78.0 - 100.0 fL   MCH 32.1 26.0 - 34.0 pg   MCHC 35.1 30.0 - 36.0 g/dL   RDW 13.1 11.5 - 15.5 %   Platelets 134 (L) 150 - 400 K/uL   Neutrophils Relative % 79 %   Neutro Abs 6.8 1.7 - 7.7 K/uL   Lymphocytes Relative 15 %   Lymphs Abs 1.3 0.7 - 4.0 K/uL   Monocytes Relative 6 %   Monocytes Absolute 0.5 0.1 - 1.0 K/uL   Eosinophils Relative 0 %   Eosinophils Absolute 0.0 0.0 - 0.7 K/uL   Basophils Relative 0 %   Basophils Absolute 0.0 0.0 - 0.1 K/uL  Comprehensive metabolic panel  Result Value Ref Range   Sodium 138 135 - 145 mmol/L   Potassium 3.7 3.5 - 5.1 mmol/L   Chloride 103 101 - 111 mmol/L    CO2 25 22 - 32 mmol/L   Glucose, Bld 218 (H) 65 - 99 mg/dL   BUN 10 6 - 20 mg/dL   Creatinine, Ser 0.98 0.61 - 1.24 mg/dL   Calcium 9.2 8.9 - 10.3 mg/dL   Total Protein 7.1 6.5 - 8.1 g/dL   Albumin 3.8 3.5 - 5.0 g/dL   AST 17 15 - 41 U/L   ALT 15 (L) 17 - 63 U/L   Alkaline Phosphatase 46 38 - 126 U/L   Total Bilirubin 1.2 0.3 - 1.2 mg/dL   GFR calc non Af Amer >60 >60 mL/min   GFR calc Af Amer >60 >60 mL/min   Anion gap 10 5 - 15  Urine microscopic-add on  Result Value Ref Range   Squamous Epithelial / LPF NONE SEEN NONE SEEN   WBC, UA NONE SEEN 0 - 5 WBC/hpf   RBC / HPF 0-5 0 - 5 RBC/hpf   Bacteria, UA NONE SEEN NONE SEEN     1515:  Workup reassuring. Pt has tol PO well while in the ED without N/V.  No stooling while in the ED.  Abd benign, VSS. Pt has ambulated with steady gait, easy resps, NAD. Feels better and wants to go home now. Long hx of chronic abd pain and recurrent N/V, with multiple ED visits for same.  Pt endorses acute flair of his usual long standing chronic pain today, no change from his usual chronic pain pattern.  Pt encouraged to follow his gastroparesis diet, take his meds as rx, and f/u with his PMD and GI MD for good continuity of care and control of his chronic, recurrent symptoms.  Pt verb understanding.   1550:  Called back into exam room by ED RN: pt requesting narcotic pain medication rx, as well as rx for dronabinol (merinol). Pt states he "didn't get that rx filled." [facility] records accessed via Care Everywhere: pt given rx merinol on hospital discharge 08/20/15. [place] Controlled Substance Database accessed: pt filled dronabinol 2.5mg , #60, on 08/22/15. Pt insistent he did not get this rx or fill it. Pt encouraged to check his medications when he gets home, and call [facility] if there is a discrepancy. Pt and family verb understanding, but continue insistent he did not fill this prescription and needs another (despite my showing him and his  family the print out  from [place] that he did fill the prescription). Concerned regarding this behavior.     Francine Graven, DO 09/09/15 1038

## 2015-09-07 NOTE — ED Notes (Signed)
Pt requesting pain medication at this time.

## 2015-09-07 NOTE — ED Notes (Signed)
Pt given diet coke and sprite zero.

## 2015-09-07 NOTE — ED Notes (Signed)
Pt states that he was seen in er earlier, went home and started having vomiting and abd pain again,

## 2015-09-08 LAB — BASIC METABOLIC PANEL
Anion gap: 10 (ref 5–15)
BUN: 10 mg/dL (ref 6–20)
CO2: 25 mmol/L (ref 22–32)
Calcium: 8.9 mg/dL (ref 8.9–10.3)
Chloride: 102 mmol/L (ref 101–111)
Creatinine, Ser: 1.05 mg/dL (ref 0.61–1.24)
GFR calc Af Amer: >60 mL/min (ref >60)
GFR calc non Af Amer: >60 mL/min (ref >60)
Glucose, Bld: 227 mg/dL — ABNORMAL HIGH (ref 65–99)
Potassium: 3.5 mmol/L (ref 3.5–5.1)
Sodium: 137 mmol/L (ref 135–145)

## 2015-09-08 MED ORDER — SUCRALFATE 1 G PO TABS
1.0000 g | ORAL_TABLET | Freq: Once | ORAL | Status: AC
  Administered 2015-09-08: 1 g via ORAL
  Filled 2015-09-08: qty 1

## 2015-09-08 MED ORDER — LORAZEPAM 1 MG PO TABS: 1.0000 mg | ORAL_TABLET | Freq: Three times a day (TID) | ORAL | Status: DC | PRN

## 2015-09-08 NOTE — ED Notes (Signed)
Almyra Free PA at bedside speaking with pt and family,

## 2015-09-08 NOTE — ED Notes (Signed)
Pt and family updated,  

## 2015-09-08 NOTE — Discharge Instructions (Signed)
Nausea and Vomiting  Nausea is a sick feeling that often comes before throwing up (vomiting). Vomiting is a reflex where stomach contents come out of your mouth. Vomiting can cause severe loss of body fluids (dehydration). Children and elderly adults can become dehydrated quickly, especially if they also have diarrhea. Nausea and vomiting are symptoms of a condition or disease. It is important to find the cause of your symptoms.  CAUSES    Direct irritation of the stomach lining. This irritation can result from increased acid production (gastroesophageal reflux disease), infection, food poisoning, taking certain medicines (such as nonsteroidal anti-inflammatory drugs), alcohol use, or tobacco use.   Signals from the brain.These signals could be caused by a headache, heat exposure, an inner ear disturbance, increased pressure in the brain from injury, infection, a tumor, or a concussion, pain, emotional stimulus, or metabolic problems.   An obstruction in the gastrointestinal tract (bowel obstruction).   Illnesses such as diabetes, hepatitis, gallbladder problems, appendicitis, kidney problems, cancer, sepsis, atypical symptoms of a heart attack, or eating disorders.   Medical treatments such as chemotherapy and radiation.   Receiving medicine that makes you sleep (general anesthetic) during surgery.  DIAGNOSIS  Your caregiver may ask for tests to be done if the problems do not improve after a few days. Tests may also be done if symptoms are severe or if the reason for the nausea and vomiting is not clear. Tests may include:   Urine tests.   Blood tests.   Stool tests.   Cultures (to look for evidence of infection).   X-rays or other imaging studies.  Test results can help your caregiver make decisions about treatment or the need for additional tests.  TREATMENT  You need to stay well hydrated. Drink frequently but in small amounts.You may wish to drink water, sports drinks, clear broth, or eat frozen  ice pops or gelatin dessert to help stay hydrated.When you eat, eating slowly may help prevent nausea.There are also some antinausea medicines that may help prevent nausea.  HOME CARE INSTRUCTIONS    Take all medicine as directed by your caregiver.   If you do not have an appetite, do not force yourself to eat. However, you must continue to drink fluids.   If you have an appetite, eat a normal diet unless your caregiver tells you differently.    Eat a variety of complex carbohydrates (rice, wheat, potatoes, bread), lean meats, yogurt, fruits, and vegetables.    Avoid high-fat foods because they are more difficult to digest.   Drink enough water and fluids to keep your urine clear or pale yellow.   If you are dehydrated, ask your caregiver for specific rehydration instructions. Signs of dehydration may include:    Severe thirst.    Dry lips and mouth.    Dizziness.    Dark urine.    Decreasing urine frequency and amount.    Confusion.    Rapid breathing or pulse.  SEEK IMMEDIATE MEDICAL CARE IF:    You have blood or brown flecks (like coffee grounds) in your vomit.   You have black or bloody stools.   You have a severe headache or stiff neck.   You are confused.   You have severe abdominal pain.   You have chest pain or trouble breathing.   You do not urinate at least once every 8 hours.   You develop cold or clammy skin.   You continue to vomit for longer than 24 to 48 hours.     You have a fever.  MAKE SURE YOU:    Understand these instructions.   Will watch your condition.   Will get help right away if you are not doing well or get worse.     This information is not intended to replace advice given to you by your health care provider. Make sure you discuss any questions you have with your health care provider.     Document Released: 07/28/2005 Document Revised: 10/20/2011 Document Reviewed: 12/25/2010  Elsevier Interactive Patient Education 2016 [employer]  Gastroparesis  Gastroparesis, also  called delayed gastric emptying, is a condition in which food takes longer than normal to empty from the stomach. The condition is usually long-lasting (chronic).  CAUSES  This condition may be caused by:   An endocrine disorder, such as hypothyroidism or diabetes. Diabetes is the most common cause of this condition.   A nervous system disease, such as Parkinson disease or multiple sclerosis.   Cancer, infection, or surgery of the stomach or vagus nerve.   A connective tissue disorder, such as scleroderma.   Certain medicines.  In most cases, the cause is not known.  RISK FACTORS  This condition is more likely to develop in:   People with certain disorders, including endocrine disorders, eating disorders, amyloidosis, and scleroderma.   People with certain diseases, including Parkinson disease or multiple sclerosis.   People with cancer or infection of the stomach or vagus nerve.   People who have had surgery on the stomach or vagus nerve.   People who take certain medicines.   Women.  SYMPTOMS  Symptoms of this condition include:   An early feeling of fullness when eating.   Nausea.   Weight loss.   Vomiting.   Heartburn.   Abdominal bloating.   Inconsistent blood glucose levels.   Lack of appetite.   Acid from the stomach coming up into the esophagus (gastroesophageal reflux).   Spasms of the stomach.  Symptoms may come and go.  DIAGNOSIS  This condition is diagnosed with tests, such as:   Tests that check how long it takes food to move through the stomach and intestines. These tests include:   Upper gastrointestinal (GI) series. In this test, X-rays of the intestines are taken after you drink a liquid. The liquid makes the intestines show up better on the X-rays.   Gastric emptying scintigraphy. In this test, scans are taken after you eat food that contains a small amount of radioactive material.   Wireless capsule GI monitoring system. This test involves swallowing a capsule that records  information about movement through the stomach.   Gastric manometry. This test measures electrical and muscular activity in the stomach. It is done with a thin tube that is passed down the throat and into the stomach.   Endoscopy. This test checks for abnormalities in the lining of the stomach. It is done with a long, thin tube that is passed down the throat and into the stomach.   An ultrasound. This test can help rule out gallbladder disease or pancreatitis as a cause of your symptoms. It uses sound waves to take pictures of the inside of your body.  TREATMENT  There is no cure for gastroparesis. This condition may be managed with:   Treatment of the underlying condition causing the gastroparesis.   Lifestyle changes, including exercise and dietary changes. Dietary changes can include:   Changes in what and when you eat.   Eating smaller meals more often.   Eating   low-fat foods.   Eating low-fiber forms of high-fiber foods, such as cooked vegetables instead of raw vegetables.   Having liquid foods in place of solid foods. Liquid foods are easier to digest.   Medicines. These may be given to control nausea and vomiting and to stimulate stomach muscles.   Getting food through a feeding tube. This may be done in severe cases.   A gastric neurostimulator. This is a device that is inserted into the body with surgery. It helps improve stomach emptying and control nausea and vomiting.  HOME CARE INSTRUCTIONS   Follow your health care provider's instructions about exercise and diet.   Take medicines only as directed by your health care provider.  SEEK MEDICAL CARE IF:   Your symptoms do not improve with treatment.   You have new symptoms.  SEEK IMMEDIATE MEDICAL CARE IF:   You have severe abdominal pain that does not improve with treatment.   You have nausea that does not go away.   You cannot keep fluids down.     This information is not intended to replace advice given to you by your health care  provider. Make sure you discuss any questions you have with your health care provider.     Document Released: 07/28/2005 Document Revised: 12/12/2014 Document Reviewed: 07/24/2014  Elsevier Interactive Patient Education 2016 Elsevier Inc.

## 2015-09-09 NOTE — ED Provider Notes (Signed)
CSN: [id number]     Arrival date & time 09/07/15  2051 History   First MD Initiated Contact with Patient 09/07/15 2251     Chief Complaint  Patient presents with  . Emesis     (Consider location/radiation/quality/duration/timing/severity/associated sxs/prior Treatment) The history is provided by the patient and the spouse.   Timothy Clark is a 45 y.o. male with history outlined below, most pertinent for DM induced severe gastroparesis which is treated by his GI specialist at [facility], currently with a combination of erythromycin and reglan, presenting with persistent epigastric pain and vomiting.  He was seen here this afternoon, felt improved at the time of his discharge, but returns for worsened sx again.  He denies diarrhea, fevers, abdominal distention, hematemesis, chest pain or sob.  At his visit this am there was confusion about the new medication Marinol which was prescribed by his MD at [facility] denied at his earlier visit having this med despite its reflection on the [place].  When he got home today, he checked his meds and found it in the refridge behind his insulin as it was labeled to keep it cool, he thought this was an insulin med, hence the confusion this morning.  He took 2 tablets prior to arrival resulting in emesis.   Wife at bedside mentions that during his last hospitalization at [facility], his emesis improved when given an anxiolytic medicine.    Past Medical History  Diagnosis Date  . Type 2 diabetes mellitus with complications ([place])     diagnosed around age 39  . Glaucoma   . Eye hemorrhage   . Gastritis   . GERD (gastroesophageal reflux disease)   . Esophagitis   . Hiccups   . Helicobacter pylori (H. pylori) infection   . Hypertension   . Gastroparesis   . Chronic abdominal pain   . Nausea and vomiting     chronic, recurrent   Past Surgical History  Procedure Laterality Date  . Eye surgery    . Esophagogastroduodenoscopy  2015    Dr.  Britta Mccreedy  . Esophagogastroduodenoscopy N/A 10/26/2014    RMR: Distal esophagititis-likely reflux related although an element of pill induced injuruy no exclueded  status post biopsy. Diffusely abnormal gastric mucosa of uncertain signigicane -status post gastric biopsy. Focal area of excoriation in the cardia most consistant with a trauma of heaving.   . Eye surgery     Family History  Problem Relation Age of Onset  . Ovarian cancer Mother   . Cervical cancer Sister   . Diabetes Sister   . Colon cancer Neg Hx    Social History  Substance Use Topics  . Smoking status: Never Smoker   . Smokeless tobacco: Never Used  . Alcohol Use: No    Review of Systems  Constitutional: Negative for fever and chills.  HENT: Negative for congestion and sore throat.   Eyes: Negative.   Respiratory: Negative for chest tightness and shortness of breath.   Cardiovascular: Negative for chest pain.  Gastrointestinal: Positive for nausea, vomiting and abdominal pain. Negative for diarrhea and blood in stool.  Genitourinary: Negative.   Musculoskeletal: Negative for joint swelling, arthralgias and neck pain.  Skin: Negative.  Negative for rash and wound.  Neurological: Negative for dizziness, weakness, light-headedness, numbness and headaches.  Psychiatric/Behavioral: Negative.       Allergies  Donnatal; Fish allergy; Haldol; Lidocaine; Maalox; Aspirin; Bactrim; Doxycycline; Penicillins; Phenergan; and Zofran  Home Medications   Prior to Admission medications  Medication Sig Start Date End Date Taking? Authorizing Provider  baclofen (LIORESAL) 10 MG tablet Take 5 mg by mouth 2 (two) times daily. 08/20/15  Yes Historical Provider, MD  brimonidine-timolol (COMBIGAN) 0.2-0.5 % ophthalmic solution Place 1 drop into both eyes at bedtime.   Yes Historical Provider, MD  erythromycin (E-MYCIN) 250 MG tablet Take 250 mg by mouth daily.   Yes Historical Provider, MD  gabapentin (NEURONTIN) 600 MG tablet Take 600  mg by mouth daily as needed (for neuropathy).    Yes Historical Provider, MD  hydroxypropyl methylcellulose / hypromellose (ISOPTO TEARS / GONIOVISC) 2.5 % ophthalmic solution Place 1 drop into both eyes as needed for dry eyes.   Yes Historical Provider, MD  insulin aspart protamine- aspart (NOVOLOG MIX 70/30) (70-30) 100 UNIT/ML injection Inject 0.15 mLs (15 Units total) into the skin 3 (three) times daily. Patient taking differently: Inject 35 Units into the skin 3 (three) times daily.  02/20/15  Yes Orvan Falconer, MD  insulin detemir (LEVEMIR) 100 UNIT/ML injection Inject 30 Units into the skin at bedtime.   Yes Historical Provider, MD  lisinopril-hydrochlorothiazide (PRINZIDE,ZESTORETIC) 20-25 MG tablet Take 1 tablet by mouth daily.   Yes Historical Provider, MD  metoCLOPramide (REGLAN) 10 MG tablet Take 1 tablet (10 mg total) by mouth 4 (four) times daily -  before meals and at bedtime. 09/07/15  Yes Francine Graven, DO  pantoprazole (PROTONIX) 40 MG tablet Take 1 tablet (40 mg total) by mouth 2 (two) times daily before a meal. 06/14/15  Yes Mahala Menghini, PA-C  sucralfate (CARAFATE) 1 g tablet TAKE ONE TABLET BY MOUTH 4 TIMES DAILY 08/31/15  Yes Orvil Feil, NP  LORazepam (ATIVAN) 1 MG tablet Take 1 tablet (1 mg total) by mouth 3 (three) times daily as needed for anxiety. 09/08/15   Evalee Jefferson, PA-C   BP 165/103 mmHg  Pulse 92  Temp(Src) 98.7 F (37.1 C) (Oral)  Resp 16  Ht 6' (1.829 m)  Wt 117.028 kg  BMI 34.98 kg/m2  SpO2 99% Physical Exam  Constitutional: He appears well-developed and well-nourished.  HENT:  Head: Normocephalic and atraumatic.  Eyes: Conjunctivae are normal.  Neck: Normal range of motion.  Cardiovascular: Normal rate, regular rhythm, normal heart sounds and intact distal pulses.   Pulmonary/Chest: Effort normal and breath sounds normal. He has no wheezes.  Abdominal: Soft. Bowel sounds are normal. There is tenderness. There is no rebound and no guarding.   Musculoskeletal: Normal range of motion.  Neurological: He is alert.  Skin: Skin is warm and dry.  Psychiatric: He has a normal mood and affect.  Nursing note and vitals reviewed.   ED Course  Procedures (including critical care time) Labs Review Labs Reviewed  BASIC METABOLIC PANEL - Abnormal; Notable for the following:    Glucose, Bld 227 (*)    All other components within normal limits    Imaging Review No results found. I have personally reviewed and evaluated these images and lab results as part of my medical decision-making.   EKG Interpretation None      MDM   Final diagnoses:  Bilious vomiting with nausea  Gastroparesis   Review of recent [facility] hospitalization.  Pt did obtain improvement in sx when ativan given.  Labs reviewed, stable.  Pt was given 1 liter of IV NS.  Reglan, benadryl and ativan given , pt tolerated well and nausea improved, endorsing however burning in epigastric c/w his GERD, carafate given with resolution.  Pt felt improved enough  to go home.  He was given a several day script for ativan.  Encouraged to start the Marinol as recommended by his GI specialist, f/u there as needed, returning here for any worsened sx.    Evalee Jefferson, PA-C 09/09/15 1406  Dorie Rank, MD 09/12/15 [phone]

## 2015-09-27 ENCOUNTER — Ambulatory Visit (INDEPENDENT_AMBULATORY_CARE_PROVIDER_SITE_OTHER): Payer: Medicaid Other | Admitting: Nurse Practitioner

## 2015-09-27 ENCOUNTER — Encounter: Payer: Self-pay | Admitting: Nurse Practitioner

## 2015-09-27 VITALS — BP 124/79 | HR 89 | Temp 97.1°F | Ht 72.0 in | Wt 252.6 lb

## 2015-09-27 DIAGNOSIS — R197 Diarrhea, unspecified: Secondary | ICD-10-CM | POA: Diagnosis not present

## 2015-09-27 DIAGNOSIS — E1143 Type 2 diabetes mellitus with diabetic autonomic (poly)neuropathy: Secondary | ICD-10-CM

## 2015-09-27 DIAGNOSIS — K3184 Gastroparesis: Secondary | ICD-10-CM | POA: Diagnosis not present

## 2015-09-27 NOTE — Progress Notes (Signed)
Primary Care Physician:  Jeri Modena Primary Gastroenterologist:  Dr. Gala Romney  Chief Complaint  Patient presents with  . Follow-up    HPI:   Timothy Clark is a 45 y.o. male who presents for follow-up on gastroparesis and abdominal pain. Last seen in our office 03/28/2015 at which point he is having continued symptoms. At that time his A1c was improved. Currently on Reglan which was helping. Follow-up visit with [facility] 1 month after that. Was offered and accepted referral to nutritionist to help with gastroparesis diet. At that time abdominal pain with cyclic in nature with 3 weeks of exacerbated symptoms and twice a day pain and an improvement for 2-3 weeks. Last EGD 10/26/2014 which found likely reflux distal esophagitis although possible pill injury, diffusely abnormal gastric mucosa status post biopsy, focal area of excoriation in the cardia most consistent with trauma of heaving. Surgical pathology found stomach biopsy to be H. pylori gastritis, esophageal biopsy squamous mucosa with acute ulcer. Due to multiple allergies he was referred to infectious disease for treatment.  Office visit 04/11/2015 for abdominal pain at [facility] to obtain GET, labs, and if GET nondiagnostic consider expanded acid workup including pH study. [facility] recently [facility] January 2017 for acute on chronic abdominal pain with intractable nausea, vomiting likely secondary to gastroparesis flare. EGD done at that time found mild esophagitis and gastric contents but no active bleeding. Gastric emptying test was done and it was a rapid exam but symptoms consistent with gastroparesis. Reglan was discontinued, given IV Thorazine. Symptoms found only respond to Ativan. Was started on erythromycin 250 mg for gastroparesis which dramatically improved as well as baclofen twice a day for hiccups. He was discharged on Marinol, baclofen, and erythromycin with follow-up [facility] in a few  weeks.  Today he states he was discharged about 3-4 weeks ago. Has been doing well on new medications. Had laser eye surgery yesterday and is doing well with this. He has been to the ER twice since hospital discharge. Has a follow-up next Wednesday at [facility]. While in the ER requested marinol Rx because he stated he did not fill it, but provider documented controlled substance database showing the Rx was filled, so a replacement Rx was not provided. The past couple weeks he has been doing well, has been avoiding red meat. No nausea and vomiting since then. Has had diarrhea. Having 3-4 very loose stools a day. Denies fever, chills, hematochezia, melena. This started about 2 weeks ago. Is drinking plenty of fluids to stay hydrated. Denies chest pain, dyspnea, dizziness, lightheadedness, syncope, near syncope. Denies any other upper or lower GI symptoms.  Past Medical History  Diagnosis Date  . Type 2 diabetes mellitus with complications ([place])     diagnosed around age 20  . Glaucoma   . Eye hemorrhage   . Gastritis   . GERD (gastroesophageal reflux disease)   . Esophagitis   . Hiccups   . Helicobacter pylori (H. pylori) infection   . Hypertension   . Gastroparesis   . Chronic abdominal pain   . Nausea and vomiting     chronic, recurrent    Past Surgical History  Procedure Laterality Date  . Eye surgery    . Esophagogastroduodenoscopy  2015    Dr. Britta Mccreedy  . Esophagogastroduodenoscopy N/A 10/26/2014    RMR: Distal esophagititis-likely reflux related although an element of pill induced injuruy no exclueded  status post biopsy. Diffusely abnormal gastric mucosa of uncertain  signigicane -status post gastric biopsy. Focal area of excoriation in the cardia most consistant with a trauma of heaving.   . Eye surgery      Current Outpatient Prescriptions  Medication Sig Dispense Refill  . baclofen (LIORESAL) 10 MG tablet Take 5 mg by mouth 2 (two) times daily.    . brimonidine-timolol (COMBIGAN)  0.2-0.5 % ophthalmic solution Place 1 drop into both eyes at bedtime.    . dronabinol (MARINOL) 2.5 MG capsule Take 2.5 mg by mouth 2 (two) times daily before lunch and supper.    [name] erythromycin (E-MYCIN) 250 MG tablet Take 250 mg by mouth daily.    [name] gabapentin (NEURONTIN) 600 MG tablet Take 600 mg by mouth daily as needed (for neuropathy).     . hydroxypropyl methylcellulose / hypromellose (ISOPTO TEARS / GONIOVISC) 2.5 % ophthalmic solution Place 1 drop into both eyes as needed for dry eyes.    . insulin aspart protamine- aspart (NOVOLOG MIX 70/30) (70-30) 100 UNIT/ML injection Inject 0.15 mLs (15 Units total) into the skin 3 (three) times daily. (Patient taking differently: Inject 35 Units into the skin 3 (three) times daily. ) 10 mL 11  . insulin detemir (LEVEMIR) 100 UNIT/ML injection Inject 30 Units into the skin at bedtime.    [name] lisinopril-hydrochlorothiazide (PRINZIDE,ZESTORETIC) 20-25 MG tablet Take 1 tablet by mouth daily.    [name] LORazepam (ATIVAN) 1 MG tablet Take 1 tablet (1 mg total) by mouth 3 (three) times daily as needed for anxiety. 15 tablet 0  . metoCLOPramide (REGLAN) 10 MG tablet Take 1 tablet (10 mg total) by mouth 4 (four) times daily -  before meals and at bedtime. 30 tablet 0  . pantoprazole (PROTONIX) 40 MG tablet Take 1 tablet (40 mg total) by mouth 2 (two) times daily before a meal. 60 tablet 5  . sucralfate (CARAFATE) 1 g tablet TAKE ONE TABLET BY MOUTH 4 TIMES DAILY 120 tablet 0   No current facility-administered medications for this visit.    Allergies as of 09/27/2015 - Review Complete 09/27/2015  Allergen Reaction Noted  . Donnatal [pb-hyoscy-atropine-scopol er] Anaphylaxis 07/12/2015  . Fish allergy Anaphylaxis, Hives, and Rash 01/14/2015  . Haldol [haloperidol lactate] Other (See Comments) 08/07/2015  . Lidocaine Anaphylaxis 07/12/2015  . Maalox [calcium carbonate antacid] Anaphylaxis 07/12/2015  . Aspirin Hives and Itching 03/22/2009  . Bactrim  [sulfamethoxazole-trimethoprim] Itching 12/06/2014  . Doxycycline Hives and Itching 03/20/2014  . Penicillins Hives and Itching 03/22/2009  . Phenergan [promethazine hcl] Nausea And Vomiting 10/27/2014  . Zofran [ondansetron hcl] Other (See Comments) 10/27/2014    Family History  Problem Relation Age of Onset  . Ovarian cancer Mother   . Cervical cancer Sister   . Diabetes Sister   . Colon cancer Neg Hx     Social History   Social History  . Marital Status: Single    Spouse Name: N/A  . Number of Children: N/A  . Years of Education: N/A   Occupational History  . Not on file.   Social History Main Topics  . Smoking status: Never Smoker   . Smokeless tobacco: Never Used  . Alcohol Use: No  . Drug Use: No  . Sexual Activity: Not on file   Other Topics Concern  . Not on file   Social History Narrative    Review of Systems: General: Negative for anorexia, weight loss, fever, chills, fatigue, weakness. ENT: Negative for hoarseness, difficulty swallowing. CV: Negative for chest pain, angina, palpitations, peripheral edema.  Respiratory:  Negative for dyspnea at rest, cough, sputum, wheezing.  GI: See history of present illness. Endo: Negative for unusual weight change.  Heme: Negative for bruising or bleeding.    Physical Exam: BP 124/79 mmHg  Pulse 89  Temp(Src) 97.1 F (36.2 C)  Ht 6' (1.829 m)  Wt 252 lb 9.6 oz (114.579 kg)  BMI 34.25 kg/m2 General:   Alert and oriented. Pleasant and cooperative. Well-nourished and well-developed.  Head:  Normocephalic and atraumatic. Cardiovascular:  S1, S2 present without murmurs appreciated. Extremities without clubbing or edema. Respiratory:  Clear to auscultation bilaterally. No wheezes, rales, or rhonchi. No distress.  Gastrointestinal:  +BS, rounded but soft, non-tender and non-distended. No HSM noted. No guarding or rebound. No masses appreciated.  Rectal:  Deferred  Neurologic:  Alert and oriented x4;  grossly  normal neurologically. Psych:  Alert and cooperative. Normal mood and affect.    09/27/2015 9:28 AM

## 2015-09-27 NOTE — Patient Instructions (Signed)
1. Bring her stool samples back to [facility], across the street from [facility]. 2. Return for follow-up here in 6 months. 3. Continue to see [facility] as a recommend.

## 2015-09-28 NOTE — Assessment & Plan Note (Signed)
Patient complaining of nonbloody persistent diarrhea. We'll check stool studies this time to evaluate for infection and especially C. Diff due to recent hospital admission and use of erythromycin for gastroparesis. Otherwise, recommend keep his upcoming appointment with [facility]. Return for follow-up here in 6 months to touch base.

## 2015-09-28 NOTE — Assessment & Plan Note (Signed)
Recommend keep his follow-up with [facility]. Return for follow-up. Touch base in 6 months. Continue medications he was prescribed after hospital discharge.

## 2015-10-01 NOTE — Progress Notes (Signed)
cc'ed to pcp °

## 2015-11-14 ENCOUNTER — Other Ambulatory Visit: Payer: Self-pay | Admitting: Gastroenterology

## 2016-01-24 ENCOUNTER — Other Ambulatory Visit: Payer: Self-pay | Admitting: Nurse Practitioner

## 2016-03-10 ENCOUNTER — Other Ambulatory Visit: Payer: Self-pay | Admitting: Gastroenterology

## 2016-03-26 ENCOUNTER — Ambulatory Visit: Payer: Medicaid Other | Admitting: Nurse Practitioner

## 2016-04-22 ENCOUNTER — Ambulatory Visit (INDEPENDENT_AMBULATORY_CARE_PROVIDER_SITE_OTHER): Payer: BLUE CROSS/BLUE SHIELD | Admitting: Nurse Practitioner

## 2016-04-22 ENCOUNTER — Encounter: Payer: Self-pay | Admitting: Nurse Practitioner

## 2016-04-22 VITALS — BP 165/98 | HR 74 | Temp 97.9°F | Ht 72.0 in | Wt 284.6 lb

## 2016-04-22 DIAGNOSIS — K911 Postgastric surgery syndromes: Secondary | ICD-10-CM

## 2016-04-22 DIAGNOSIS — R1110 Vomiting, unspecified: Secondary | ICD-10-CM | POA: Diagnosis not present

## 2016-04-22 DIAGNOSIS — K2970 Gastritis, unspecified, without bleeding: Secondary | ICD-10-CM | POA: Diagnosis not present

## 2016-04-22 DIAGNOSIS — R1013 Epigastric pain: Secondary | ICD-10-CM | POA: Diagnosis not present

## 2016-04-22 NOTE — Patient Instructions (Signed)
1. Continue taking your current medications as you have been. 2. Follow-up with your primary care about blood sugar control and diabetes. 3. All up with [facility] as previously recommended. 4. Return for follow-up in 6 months. 5. Call us if you have any worsening symptoms before then.

## 2016-04-22 NOTE — Progress Notes (Signed)
CC'ED TO PCP 

## 2016-04-22 NOTE — Assessment & Plan Note (Signed)
History of gastritis with significant GERD symptoms. GERD symptoms currently well managed on PPI. Recommend continue PPI, return for follow-up in 6 months.

## 2016-04-22 NOTE — Assessment & Plan Note (Addendum)
Nausea and vomiting significantly improved with medication adjustments at [facility]. Has not vomited in "a very long time." He did have an episode of nausea and vomiting this morning which he attributes to higher than normal blood sugar at 160.   Because of this he did not keep his blood pressure medication down explains his elevated BP 165/98. He is asymptomatic from a cardiac standpoint related to his blood pressure. Informed him if his blood pressure does cause chest pain, shortness of breath, dizziness, syncope, near syncope, or other alarm features to present to the emergency department or call his primary care. Follow-up in 6 months. Follow-up with primary care regarding blood sugar control.

## 2016-04-22 NOTE — Assessment & Plan Note (Signed)
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 Medication changes noted in history of present illness. Patient has done significantly better. Recommend continue current medications, follow up with [facility] previously recommended, return for follow-up here in 6 months. Call if any worsening symptoms.

## 2016-04-22 NOTE — Assessment & Plan Note (Signed)
Symptoms resolved at this time. Return for follow-up in 6 months.

## 2016-04-22 NOTE — Progress Notes (Signed)
Referring Provider: Tawni Carnes, PA-C Primary Care Physician:  Jeri Modena Primary GI:  Dr. Gala Romney  Chief Complaint  Patient presents with  . Follow-up    vomited this am    HPI:   Timothy Clark is a 45 y.o. male who presents for follow-up on gastroparesis and diarrhea. He was last seen in our office 09/27/2015 at which point he was doing well on new medications. Had been to the ER twice since hospital discharge and had an upcoming scheduled follow-up at [facility]. Had been avoiding red meat which helped control his symptoms, no nausea and vomiting since then. Noted diarrhea 3-4 very loose stools a day for the previous couple months. No other GI symptoms. Stool studies were ordered, recommend keep upcoming appointment with weight forced, return for follow-up in 6 months.  Follow-up visit at [facility] 10/01/2015, visit notes reviewed. Assessment includes history of type 2 diabetes, history of H. pylori status post eradication, hypertension, recurrent nausea/vomiting likely secondary to rapid gastric emptying/dumping syndrome. Recommended increase Marinol to 5 mg twice a day, continue twice a day PPI, start Bentyl 10 mg 3 times a day, strict diabetes control. Recommended follow-up visit in 3 months (12/31/2015). No appointment at [facility] found in the system for May 2017 as recommended.  No ER visits in our system since last office visit.  Today he states he's doing well overall. Had nausea and vomiting for the first time in "a long time" this morning. He thinks the vomiting this morning isdue to his blood sugar being a little elevated at 160. Has had a hard time with his diabetes control lately, has increased weight lately. Medication changes by [facility] have helped significantly. He vomited after taking his blood pressure medication which is why his BP is high this morning. He stopped eating red meat and has helped as well. Rare N/V, denies abdominal pain, hematochezia, melena.  GERD well-controlled on PPI. Denies chest pain, dyspnea, dizziness, lightheadedness, syncope, near syncope. Denies any other upper or lower GI symptoms.  Is not taking E-mycin daily, only as needed. PCP recommended to hold this until he follows-up with [facility] (in about a month from now).   Past Medical History:  Diagnosis Date  . Chronic abdominal pain   . Esophagitis   . Eye hemorrhage   . Gastritis   . Gastroparesis   . GERD (gastroesophageal reflux disease)   . Glaucoma   . Helicobacter pylori (H. pylori) infection   . Hiccups   . Hypertension   . Nausea and vomiting    chronic, recurrent  . Type 2 diabetes mellitus with complications (HCC)    diagnosed around age 43    Past Surgical History:  Procedure Laterality Date  . ESOPHAGOGASTRODUODENOSCOPY  2015   Dr. Britta Mccreedy  . ESOPHAGOGASTRODUODENOSCOPY N/A 10/26/2014   RMR: Distal esophagititis-likely reflux related although an element of pill induced injuruy no exclueded  status post biopsy. Diffusely abnormal gastric mucosa of uncertain signigicane -status post gastric biopsy. Focal area of excoriation in the cardia most consistant with a trauma of heaving.   [name] EYE SURGERY    . EYE SURGERY      Current Outpatient Prescriptions  Medication Sig Dispense Refill  . brimonidine-timolol (COMBIGAN) 0.2-0.5 % ophthalmic solution Place 1 drop into both eyes at bedtime.    [name] erythromycin (E-MYCIN) 250 MG tablet Take 250 mg by mouth daily as needed.     . gabapentin (NEURONTIN) 600 MG tablet Take 600 mg by mouth daily as  needed (for neuropathy).     . hydroxypropyl methylcellulose / hypromellose (ISOPTO TEARS / GONIOVISC) 2.5 % ophthalmic solution Place 1 drop into both eyes as needed for dry eyes.    . insulin aspart protamine- aspart (NOVOLOG MIX 70/30) (70-30) 100 UNIT/ML injection Inject 0.15 mLs (15 Units total) into the skin 3 (three) times daily. (Patient taking differently: Inject 35 Units into the skin 3 (three) times daily. )  10 mL 11  . insulin detemir (LEVEMIR) 100 UNIT/ML injection Inject 30 Units into the skin at bedtime.    [name] lisinopril-hydrochlorothiazide (PRINZIDE,ZESTORETIC) 20-25 MG tablet Take 1 tablet by mouth daily.    [name] LORazepam (ATIVAN) 1 MG tablet Take 1 tablet (1 mg total) by mouth 3 (three) times daily as needed for anxiety. 15 tablet 0  . pantoprazole (PROTONIX) 40 MG tablet Take 1 tablet (40 mg total) by mouth 2 (two) times daily before a meal. 60 tablet 5  . sucralfate (CARAFATE) 1 g tablet TAKE ONE TABLET BY MOUTH 4 TIMES DAILY 120 tablet 0   No current facility-administered medications for this visit.     Allergies as of 04/22/2016 - Review Complete 04/22/2016  Allergen Reaction Noted  . Donnatal [pb-hyoscy-atropine-scopol er] Anaphylaxis 07/12/2015  . Fish allergy Anaphylaxis, Hives, and Rash 01/14/2015  . Haldol [haloperidol lactate] Other (See Comments) 08/07/2015  . Lidocaine Anaphylaxis 07/12/2015  . Maalox [calcium carbonate antacid] Anaphylaxis 07/12/2015  . Aspirin Hives and Itching 03/22/2009  . Bactrim [sulfamethoxazole-trimethoprim] Itching 12/06/2014  . Doxycycline Hives and Itching 03/20/2014  . Penicillins Hives and Itching 03/22/2009  . Phenergan [promethazine hcl] Nausea And Vomiting 10/27/2014  . Zofran [ondansetron hcl] Other (See Comments) 10/27/2014    Family History  Problem Relation Age of Onset  . Ovarian cancer Mother   . Cervical cancer Sister   . Diabetes Sister   . Colon cancer Neg Hx     Social History   Social History  . Marital status: Single    Spouse name: N/A  . Number of children: N/A  . Years of education: N/A   Social History Main Topics  . Smoking status: Never Smoker  . Smokeless tobacco: Never Used  . Alcohol use No  . Drug use: No  . Sexual activity: Not Asked   Other Topics Concern  . None   Social History Narrative  . None    Review of Systems: General: Negative for anorexia, weight loss, fever, chills, fatigue,  weakness. Eyes: Some decrease in vision related to history of glaucoma. Scheduled for upcoming surgery. ENT: Negative for hoarseness, difficulty swallowing. CV: Negative for chest pain, angina, palpitations, peripheral edema.  Respiratory: Negative for dyspnea at rest, cough, sputum, wheezing.  GI: See history of present illness. Endo: Negative for unusual weight change.  Heme: Negative for bruising or bleeding. Allergy: Negative for rash or hives.   Physical Exam: BP (!) 165/98   Pulse 74   Temp 97.9 F (36.6 C) (Oral)   Ht 6' (1.829 m)   Wt 284 lb 9.6 oz (129.1 kg)   BMI 38.60 kg/m  General:   Obese male. Alert and oriented. Pleasant and cooperative. Well-nourished and well-developed.  Ears:  Normal auditory acuity. Cardiovascular:  S1, S2 present without murmurs appreciated. Extremities without clubbing or edema. Respiratory:  Clear to auscultation bilaterally. No wheezes, rales, or rhonchi. No distress.  Gastrointestinal:  +BS, rounded but soft, non-tender and non-distended. No HSM noted. No guarding or rebound. No masses appreciated.  Rectal:  Deferred  Musculoskalatal:  Symmetrical  without gross deformities. Neurologic:  Alert and oriented x4;  grossly normal neurologically. Psych:  Alert and cooperative. Normal mood and affect. Heme/Lymph/Immune: No excessive bruising noted.    04/22/2016 10:49 AM   Disclaimer: This note was dictated with voice recognition software. Similar sounding words can inadvertently be transcribed and may not be corrected upon review.

## 2016-05-12 NOTE — Patient Instructions (Signed)
Your procedure is scheduled on:   05/19/2016              Report to [facility] at   10:50  AM.  Call this number if you have problems the morning of surgery: [phone]   Remember:   Do not eat or drink :After Midnight.    Take these medicines the morning of surgery with A SIP OF WATER:    Gabapentin, Lisinopril, and Protonix                           Take only half dose of Levemir 15 units the night before surgery        Do not wear jewelry, make-up or nail polish.  Do not wear lotions, powders, or perfumes. You may wear deodorant.  Do not bring valuables to the hospital.  Contacts, dentures or bridgework may not be worn into surgery.  Patients discharged the day of surgery will not be allowed to drive home.  Name and phone number of your driver:    [email or web address] @ Cataract Surgery  A cataract is a clouding of the lens of the eye. When a lens becomes cloudy, vision is reduced based on the degree and nature of the clouding. Surgery may be needed to improve vision. Surgery removes the cloudy lens and usually replaces it with a substitute lens (intraocular lens, IOL). LET YOUR EYE DOCTOR KNOW ABOUT:  Allergies to food or medicine.   Medicines taken including herbs, eyedrops, over-the-counter medicines, and creams.   Use of steroids (by mouth or creams).   Previous problems with anesthetics or numbing medicine.   History of bleeding problems or blood clots.   Previous surgery.   Other health problems, including diabetes and kidney problems.   Possibility of pregnancy, if this applies.  RISKS AND COMPLICATIONS  Infection.   Inflammation of the eyeball (endophthalmitis) that can spread to both eyes (sympathetic ophthalmia).   Poor wound healing.   If an IOL is inserted, it can later fall out of proper position. This is very uncommon.   Clouding of the part of your eye that holds an IOL in place. This is called an "after-cataract." These are uncommon, but easily  treated.  BEFORE THE PROCEDURE  Do not eat or drink anything except small amounts of water for 8 to 12 before your surgery, or as directed by your caregiver.   Unless you are told otherwise, continue any eyedrops you have been prescribed.   Talk to your primary caregiver about all other medicines that you take (both prescription and non-prescription). In some cases, you may need to stop or change medicines near the time of your surgery. This is most important if you are taking blood-thinning medicine.Do not stop medicines unless you are told to do so.   Arrange for someone to drive you to and from the procedure.   Do not put contact lenses in either eye on the day of your surgery.  PROCEDURE There is more than one method for safely removing a cataract. Your doctor can explain the differences and help determine which is best for you. Phacoemulsification surgery is the most common form of cataract surgery.  An injection is given behind the eye or eyedrops are given to make this a painless procedure.   A small cut (incision) is made on the edge of the clear, dome-shaped surface that covers the front of the eye (cornea).   A tiny probe is painlessly  inserted into the eye. This device gives off ultrasound waves that soften and break up the cloudy center of the lens. This makes it easier for the cloudy lens to be removed by suction.   An IOL may be implanted.   The normal lens of the eye is covered by a clear capsule. Part of that capsule is intentionally left in the eye to support the IOL.   Your surgeon may or may not use stitches to close the incision.  There are other forms of cataract surgery that require a larger incision and stiches to close the eye. This approach is taken in cases where the doctor feels that the cataract cannot be easily removed using phacoemulsification. AFTER THE PROCEDURE  When an IOL is implanted, it does not need care. It becomes a permanent part of your eye  and cannot be seen or felt.   Your doctor will schedule follow-up exams to check on your progress.   Review your other medicines with your doctor to see which can be resumed after surgery.   Use eyedrops or take medicine as prescribed by your doctor.  Document Released: 07/17/2011 Document Reviewed: 07/14/2011 [facility] Patient Information 2012 [place].  .Cataract Surgery Care After Refer to this sheet in the next few weeks. These instructions provide you with information on caring for yourself after your procedure. Your caregiver may also give you more specific instructions. Your treatment has been planned according to current medical practices, but problems sometimes occur. Call your caregiver if you have any problems or questions after your procedure.  HOME CARE INSTRUCTIONS   Avoid strenuous activities as directed by your caregiver.   Ask your caregiver when you can resume driving.   Use eyedrops or other medicines to help healing and control pressure inside your eye as directed by your caregiver.   Only take over-the-counter or prescription medicines for pain, discomfort, or fever as directed by your caregiver.   Do not to touch or rub your eyes.   You may be instructed to use a protective shield during the first few days and nights after surgery. If not, wear sunglasses to protect your eyes. This is to protect the eye from pressure or from being accidentally bumped.   Keep the area around your eye clean and dry. Avoid swimming or allowing water to hit you directly in the face while showering. Keep soap and shampoo out of your eyes.   Do not bend or lift heavy objects. Bending increases pressure in the eye. You can walk, climb stairs, and do light household chores.   Do not put a contact lens into the eye that had surgery until your caregiver says it is okay to do so.   Ask your doctor when you can return to work. This will depend on the kind of work that you do. If you  work in a dusty environment, you may be advised to wear protective eyewear for a period of time.   Ask your caregiver when it will be safe to engage in sexual activity.   Continue with your regular eye exams as directed by your caregiver.  What to expect:  It is normal to feel itching and mild discomfort for a few days after cataract surgery. Some fluid discharge is also common, and your eye may be sensitive to light and touch.   After 1 to 2 days, even moderate discomfort should disappear. In most cases, healing will take about 6 weeks.   If you received an intraocular  lens (IOL), you may notice that colors are very bright or have a blue tinge. Also, if you have been in bright sunlight, everything may appear reddish for a few hours. If you see these color tinges, it is because your lens is clear and no longer cloudy. Within a few months after receiving an IOL, these extra colors should go away. When you have healed, you will probably need new glasses.  SEEK MEDICAL CARE IF:   You have increased bruising around your eye.   You have discomfort not helped by medicine.  SEEK IMMEDIATE MEDICAL CARE IF:   You have a fever.   You have a worsening or sudden vision loss.   You have redness, swelling, or increasing pain in the eye.   You have a thick discharge from the eye that had surgery.  MAKE SURE YOU:  Understand these instructions.   Will watch your condition.   Will get help right away if you are not doing well or get worse.  Document Released: 02/14/2005 Document Revised: 07/17/2011 Document Reviewed: 03/21/2011 [facility] Patient Information 2012 [place].    Monitored Anesthesia Care  Monitored anesthesia care is an anesthesia service for a medical procedure. Anesthesia is the loss of the ability to feel pain. It is produced by medications called anesthetics. It may affect a small area of your body (local anesthesia), a large area of your body (regional anesthesia), or  your entire body (general anesthesia). The need for monitored anesthesia care depends your procedure, your condition, and the potential need for regional or general anesthesia. It is often provided during procedures where:   General anesthesia may be needed if there are complications. This is because you need special care when you are under general anesthesia.   You will be under local or regional anesthesia. This is so that you are able to have higher levels of anesthesia if needed.   You will receive calming medications (sedatives). This is especially the case if sedatives are given to put you in a semi-conscious state of relaxation (deep sedation). This is because the amount of sedative needed to produce this state can be hard to predict. Too much of a sedative can produce general anesthesia. Monitored anesthesia care is performed by one or more caregivers who have special training in all types of anesthesia. You will need to meet with these caregivers before your procedure. During this meeting, they will ask you about your medical history. They will also give you instructions to follow. (For example, you will need to stop eating and drinking before your procedure. You may also need to stop or change medications you are taking.) During your procedure, your caregivers will stay with you. They will:   Watch your condition. This includes watching you blood pressure, breathing, and level of pain.   Diagnose and treat problems that occur.   Give medications if they are needed. These may include calming medications (sedatives) and anesthetics.   Make sure you are comfortable.  Having monitored anesthesia care does not necessarily mean that you will be under anesthesia. It does mean that your caregivers will be able to manage anesthesia if you need it or if it occurs. It also means that you will be able to have a different type of anesthesia than you are having if you need it. When your procedure  is complete, your caregivers will continue to watch your condition. They will make sure any medications wear off before you are allowed to go home.  Document  Released: 04/23/2005 Document Revised: 11/22/2012 Document Reviewed: 09/08/2012 [facility] Patient Information 2014 [place], [place].

## 2016-05-14 ENCOUNTER — Encounter (HOSPITAL_COMMUNITY)
Admission: RE | Admit: 2016-05-14 | Discharge: 2016-05-14 | Disposition: A | Discharge status: Home / Self Care | Payer: BLUE CROSS/BLUE SHIELD | Source: Ambulatory Visit | Attending: Ophthalmology | Admitting: Ophthalmology

## 2016-05-14 ENCOUNTER — Encounter (HOSPITAL_COMMUNITY): Payer: Self-pay

## 2016-05-14 DIAGNOSIS — Z01812 Encounter for preprocedural laboratory examination: Secondary | ICD-10-CM | POA: Diagnosis not present

## 2016-05-14 LAB — CBC
HCT: 40.4 % (ref 39.0–52.0)
Hemoglobin: 14.3 g/dL (ref 13.0–17.0)
MCH: 31.7 pg (ref 26.0–34.0)
MCHC: 35.4 g/dL (ref 30.0–36.0)
MCV: 89.6 fL (ref 78.0–100.0)
Platelets: 126 10*3/uL — ABNORMAL LOW (ref 150–400)
RBC: 4.51 MIL/uL (ref 4.22–5.81)
RDW: 12.6 % (ref 11.5–15.5)
WBC: 7.1 10*3/uL (ref 4.0–10.5)

## 2016-05-14 LAB — BASIC METABOLIC PANEL
Anion gap: 5 (ref 5–15)
BUN: 17 mg/dL (ref 6–20)
CO2: 25 mmol/L (ref 22–32)
Calcium: 8.8 mg/dL — ABNORMAL LOW (ref 8.9–10.3)
Chloride: 104 mmol/L (ref 101–111)
Creatinine, Ser: 1.03 mg/dL (ref 0.61–1.24)
GFR calc Af Amer: >60 mL/min (ref >60)
GFR calc non Af Amer: >60 mL/min (ref >60)
Glucose, Bld: 229 mg/dL — ABNORMAL HIGH (ref 65–99)
Potassium: 3.8 mmol/L (ref 3.5–5.1)
Sodium: 134 mmol/L — ABNORMAL LOW (ref 135–145)

## 2016-05-19 ENCOUNTER — Ambulatory Visit (HOSPITAL_COMMUNITY)
Admission: RE | Admit: 2016-05-19 | Discharge: 2016-05-19 | Disposition: A | Discharge status: Home / Self Care | Payer: BLUE CROSS/BLUE SHIELD | Source: Ambulatory Visit | Attending: Ophthalmology | Admitting: Ophthalmology

## 2016-05-19 ENCOUNTER — Encounter (HOSPITAL_COMMUNITY): Payer: Self-pay | Admitting: *Deleted

## 2016-05-19 ENCOUNTER — Ambulatory Visit (HOSPITAL_COMMUNITY): Payer: BLUE CROSS/BLUE SHIELD | Admitting: Anesthesiology

## 2016-05-19 ENCOUNTER — Encounter (HOSPITAL_COMMUNITY)
Admission: RE | Disposition: A | Discharge status: Home / Self Care | Payer: Self-pay | Source: Ambulatory Visit | Attending: Ophthalmology

## 2016-05-19 DIAGNOSIS — E119 Type 2 diabetes mellitus without complications: Secondary | ICD-10-CM | POA: Diagnosis not present

## 2016-05-19 DIAGNOSIS — I10 Essential (primary) hypertension: Secondary | ICD-10-CM | POA: Diagnosis not present

## 2016-05-19 DIAGNOSIS — H2512 Age-related nuclear cataract, left eye: Secondary | ICD-10-CM | POA: Diagnosis not present

## 2016-05-19 DIAGNOSIS — H47293 Other optic atrophy, bilateral: Secondary | ICD-10-CM | POA: Insufficient documentation

## 2016-05-19 DIAGNOSIS — H409 Unspecified glaucoma: Secondary | ICD-10-CM | POA: Insufficient documentation

## 2016-05-19 DIAGNOSIS — Z79899 Other long term (current) drug therapy: Secondary | ICD-10-CM | POA: Diagnosis not present

## 2016-05-19 HISTORY — PX: CATARACT EXTRACTION W/PHACO: SHX586

## 2016-05-19 LAB — GLUCOSE, CAPILLARY: Glucose-Capillary: 275 mg/dL — ABNORMAL HIGH (ref 65–99)

## 2016-05-19 SURGERY — PHACOEMULSIFICATION, CATARACT, WITH IOL INSERTION
Anesthesia: Monitor Anesthesia Care | Site: Eye | Laterality: Left

## 2016-05-19 MED ORDER — LACTATED RINGERS IV SOLN
INTRAVENOUS | Status: DC
  Administered 2016-05-19: 11:00:00 via INTRAVENOUS

## 2016-05-19 MED ORDER — BSS IO SOLN
INTRAOCULAR | Status: DC | PRN
  Administered 2016-05-19: 15 mL

## 2016-05-19 MED ORDER — PHENYLEPHRINE-KETOROLAC 1-0.3 % IO SOLN
INTRAOCULAR | Status: AC
  Filled 2016-05-19: qty 4

## 2016-05-19 MED ORDER — FENTANYL CITRATE (PF) 100 MCG/2ML IJ SOLN
25.0000 ug | INTRAMUSCULAR | Status: AC | PRN
  Administered 2016-05-19 (×2): 25 ug via INTRAVENOUS

## 2016-05-19 MED ORDER — NEOMYCIN-POLYMYXIN-DEXAMETH 3.5-10000-0.1 OP SUSP
OPHTHALMIC | Status: DC | PRN
  Administered 2016-05-19: 2 [drp] via OPHTHALMIC

## 2016-05-19 MED ORDER — CYCLOPENTOLATE-PHENYLEPHRINE 0.2-1 % OP SOLN
1.0000 [drp] | OPHTHALMIC | Status: AC
  Administered 2016-05-19 (×3): 1 [drp] via OPHTHALMIC

## 2016-05-19 MED ORDER — EPINEPHRINE HCL 1 MG/ML IJ SOLN
INTRAMUSCULAR | Status: AC
  Filled 2016-05-19: qty 2

## 2016-05-19 MED ORDER — FENTANYL CITRATE (PF) 100 MCG/2ML IJ SOLN
INTRAMUSCULAR | Status: AC
  Filled 2016-05-19: qty 2

## 2016-05-19 MED ORDER — EPINEPHRINE HCL 1 MG/ML IJ SOLN
INTRAMUSCULAR | Status: DC | PRN
  Administered 2016-05-19: .7 mL via OPHTHALMIC

## 2016-05-19 MED ORDER — TETRACAINE HCL 0.5 % OP SOLN
1.0000 [drp] | OPHTHALMIC | Status: AC
  Administered 2016-05-19 (×3): 1 [drp] via OPHTHALMIC

## 2016-05-19 MED ORDER — PROVISC 10 MG/ML IO SOLN
INTRAOCULAR | Status: DC | PRN
  Administered 2016-05-19: 0.85 mL via INTRAOCULAR

## 2016-05-19 MED ORDER — PHENYLEPHRINE HCL 2.5 % OP SOLN
1.0000 [drp] | OPHTHALMIC | Status: AC
  Administered 2016-05-19 (×3): 1 [drp] via OPHTHALMIC

## 2016-05-19 MED ORDER — LIDOCAINE HCL 3.5 % OP GEL
1.0000 "application " | Freq: Once | OPHTHALMIC | Status: AC
  Administered 2016-05-19: 1 via OPHTHALMIC

## 2016-05-19 MED ORDER — POVIDONE-IODINE 5 % OP SOLN
OPHTHALMIC | Status: DC | PRN
  Administered 2016-05-19: 1 via OPHTHALMIC

## 2016-05-19 MED ORDER — BSS IO SOLN
INTRAOCULAR | Status: DC | PRN
  Administered 2016-05-19: 500 mL

## 2016-05-19 MED ORDER — MIDAZOLAM HCL 2 MG/2ML IJ SOLN
1.0000 mg | INTRAMUSCULAR | Status: DC | PRN
  Administered 2016-05-19: 2 mg via INTRAVENOUS

## 2016-05-19 MED ORDER — MIDAZOLAM HCL 2 MG/2ML IJ SOLN
INTRAMUSCULAR | Status: AC
  Filled 2016-05-19: qty 2

## 2016-05-19 SURGICAL SUPPLY — 12 items
CLOTH BEACON ORANGE TIMEOUT ST (SAFETY) ×2 IMPLANT
EYE SHIELD UNIVERSAL CLEAR (GAUZE/BANDAGES/DRESSINGS) ×2 IMPLANT
GLOVE BIOGEL PI IND STRL 7.0 (GLOVE) ×1 IMPLANT
GLOVE BIOGEL PI INDICATOR 7.0 (GLOVE) ×1
GLOVE EXAM NITRILE MD LF STRL (GLOVE) ×2 IMPLANT
PAD ARMBOARD 7.5X6 YLW CONV (MISCELLANEOUS) ×2 IMPLANT
RING MALYGIN (MISCELLANEOUS) IMPLANT
SIGHTPATH CAT PROC W REG LENS (Ophthalmic Related) ×2 IMPLANT
SYR 5ML LL (SYRINGE) ×2 IMPLANT
TAPE SURG TRANSPORE 1 IN (GAUZE/BANDAGES/DRESSINGS) ×1 IMPLANT
TAPE SURGICAL TRANSPORE 1 IN (GAUZE/BANDAGES/DRESSINGS) ×1
WATER STERILE IRR 250ML POUR (IV SOLUTION) ×2 IMPLANT

## 2016-05-19 NOTE — Anesthesia Postprocedure Evaluation (Signed)
  Anesthesia Post-op Note  Patient: BRANDONN CAPELLI  Procedure(s) Performed: Procedure(s) (LRB): CATARACT EXTRACTION PHACO AND INTRAOCULAR LENS PLACEMENT LEFT EYE (Left)  Patient Location:  Short Stay  Anesthesia Type: MAC  Level of Consciousness: awake  Airway and Oxygen Therapy: Patient Spontanous Breathing  Post-op Pain: none  Post-op Assessment: Post-op Vital signs reviewed, Patient's Cardiovascular Status Stable, Respiratory Function Stable, Patent Airway, No signs of Nausea or vomiting and Pain level controlled  Post-op Vital Signs: Reviewed and stable  Complications: No apparent anesthesia complications Anesthesia Post Note  Patient: SONAM HUELSMANN  Procedure(s) Performed: Procedure(s) (LRB): CATARACT EXTRACTION PHACO AND INTRAOCULAR LENS PLACEMENT LEFT EYE (Left)  Anesthesia Post Evaluation  Last Vitals:  Vitals:   05/19/16 1057  BP: (!) 175/102  Pulse: 76  Resp: 18  Temp: 36.7 C    Last Pain:  Vitals:   05/19/16 1057  TempSrc: Oral                 Brieana Shimmin

## 2016-05-19 NOTE — Anesthesia Preprocedure Evaluation (Signed)
Anesthesia Evaluation  Patient identified by MRN, date of birth, ID band Patient awake    Reviewed: Allergy & Precautions, NPO status , Patient's Chart, lab work & pertinent test results  Airway Mallampati: II  TM Distance: >3 FB     Dental  (+) Poor Dentition, Edentulous Upper   Pulmonary neg pulmonary ROS,    breath sounds clear to auscultation       Cardiovascular hypertension, Pt. on medications + Peripheral Vascular Disease   Rhythm:Regular Rate:Normal     Neuro/Psych PSYCHIATRIC DISORDERS Depression    GI/Hepatic GERD  ,  Endo/Other  diabetes, Type 2, Oral Hypoglycemic Agents  Renal/GU      Musculoskeletal   Abdominal   Peds  Hematology   Anesthesia Other Findings   Reproductive/Obstetrics                             Anesthesia Physical Anesthesia Plan  ASA: III  Anesthesia Plan: MAC   Post-op Pain Management:    Induction: Intravenous  Airway Management Planned: Nasal Cannula  Additional Equipment:   Intra-op Plan:   Post-operative Plan:   Informed Consent: I have reviewed the patients History and Physical, chart, labs and discussed the procedure including the risks, benefits and alternatives for the proposed anesthesia with the patient or authorized representative who has indicated his/her understanding and acceptance.     Plan Discussed with:   Anesthesia Plan Comments:         Anesthesia Quick Evaluation

## 2016-05-19 NOTE — Transfer of Care (Signed)
Immediate Anesthesia Transfer of Care Note  Patient: Timothy Clark  Procedure(s) Performed: Procedure(s) (LRB): CATARACT EXTRACTION PHACO AND INTRAOCULAR LENS PLACEMENT LEFT EYE (Left)  Patient Location: Shortstay  Anesthesia Type: MAC  Level of Consciousness: awake  Airway & Oxygen Therapy: Patient Spontanous Breathing   Post-op Assessment: Report given to PACU RN, Post -op Vital signs reviewed and stable and Patient moving all extremities  Post vital signs: Reviewed and stable  Complications: No apparent anesthesia complications

## 2016-05-19 NOTE — H&P (Signed)
I have reviewed the H&P, the patient was re-examined, and I have identified no interval changes in medical condition and plan of care since the history and physical of record  

## 2016-05-19 NOTE — Anesthesia Procedure Notes (Signed)
Procedure Name: MAC Date/Time: 05/19/2016 12:31 PM Performed by: Vista Deck Pre-anesthesia Checklist: Patient identified, Emergency Drugs available, Suction available, Timeout performed and Patient being monitored Patient Re-evaluated:Patient Re-evaluated prior to inductionOxygen Delivery Method: Nasal Cannula

## 2016-05-19 NOTE — Progress Notes (Addendum)
Dr. Geoffry Paradise advised patient to notify primary MD of high blood pressure, but did not order any blood pressure medications at this time. Patient verbalizes understanding. Patient not symptomatic.

## 2016-05-19 NOTE — Op Note (Signed)
Date of Admission: 05/19/2016  Date of Surgery: 05/19/2016   Pre-Op Dx: Cataract Left Eye  Post-Op Dx: Senile Nuclear Cataract Left  Eye,  Dx Code H25.12  Surgeon: Tonny Branch, M.D.  Assistants: None  Anesthesia: Topical with MAC  Indications: Painless, progressive loss of vision with compromise of daily activities.  Surgery: Cataract Extraction with Intraocular lens Implant Left Eye  Discription: The patient had dilating drops and viscous lidocaine placed into the Left eye in the pre-op holding area. Omidria was added to the infusion bag.  After transfer to the operating room, a time out was performed. The patient was then prepped and draped. Beginning with a 64 degree blade a paracentesis port was made at the surgeon's 2 o'clock position. The anterior chamber was then filled with 1% non-preserved lidocaine. This was followed by filling the anterior chamber with Provisc.  A 2.28mm keratome blade was used to make a clear corneal incision at the temporal limbus.  A bent cystatome needle was used to create a continuous tear capsulotomy. Hydrodissection was performed with balanced salt solution on a Fine canula. The lens nucleus was then removed using the phacoemulsification handpiece. Residual cortex was removed with the I&A handpiece. The anterior chamber and capsular bag were refilled with Provisc. A posterior chamber intraocular lens was placed into the capsular bag with it's injector. The implant was positioned with the Kuglan hook. The Provisc was then thoroughly removed from the anterior chamber and capsular bag with the I&A handpiece. Stromal hydration of the main incision and paracentesis port was performed with BSS on a Fine canula. The wounds were tested for leak which was negative. The patient tolerated the procedure well. There were no operative complications. The patient was then transferred to the recovery room in stable condition.  Complications: None  Specimen: None  EBL:  None  Prosthetic device: [employer]oya iSert 250, power 17.0 D, SN [id number].

## 2016-05-19 NOTE — Discharge Instructions (Signed)

## 2016-05-22 ENCOUNTER — Encounter (HOSPITAL_COMMUNITY): Payer: Self-pay | Admitting: Ophthalmology

## 2016-06-17 ENCOUNTER — Encounter (HOSPITAL_COMMUNITY)
Admission: RE | Admit: 2016-06-17 | Discharge: 2016-06-17 | Disposition: A | Discharge status: Home / Self Care | Payer: BLUE CROSS/BLUE SHIELD | Source: Ambulatory Visit | Attending: Ophthalmology | Admitting: Ophthalmology

## 2016-06-23 ENCOUNTER — Ambulatory Visit (HOSPITAL_COMMUNITY)
Admission: RE | Admit: 2016-06-23 | Discharge: 2016-06-23 | Disposition: A | Discharge status: Home / Self Care | Payer: BLUE CROSS/BLUE SHIELD | Source: Ambulatory Visit | Attending: Ophthalmology | Admitting: Ophthalmology

## 2016-06-23 ENCOUNTER — Encounter (HOSPITAL_COMMUNITY): Payer: Self-pay | Admitting: *Deleted

## 2016-06-23 ENCOUNTER — Ambulatory Visit (HOSPITAL_COMMUNITY): Payer: BLUE CROSS/BLUE SHIELD | Admitting: Anesthesiology

## 2016-06-23 ENCOUNTER — Encounter (HOSPITAL_COMMUNITY)
Admission: RE | Disposition: A | Discharge status: Home / Self Care | Payer: Self-pay | Source: Ambulatory Visit | Attending: Ophthalmology

## 2016-06-23 DIAGNOSIS — H2181 Floppy iris syndrome: Secondary | ICD-10-CM | POA: Diagnosis not present

## 2016-06-23 DIAGNOSIS — F329 Major depressive disorder, single episode, unspecified: Secondary | ICD-10-CM | POA: Diagnosis not present

## 2016-06-23 DIAGNOSIS — E1136 Type 2 diabetes mellitus with diabetic cataract: Secondary | ICD-10-CM | POA: Insufficient documentation

## 2016-06-23 DIAGNOSIS — I739 Peripheral vascular disease, unspecified: Secondary | ICD-10-CM | POA: Diagnosis not present

## 2016-06-23 DIAGNOSIS — Z7984 Long term (current) use of oral hypoglycemic drugs: Secondary | ICD-10-CM | POA: Diagnosis not present

## 2016-06-23 DIAGNOSIS — Z79899 Other long term (current) drug therapy: Secondary | ICD-10-CM | POA: Diagnosis not present

## 2016-06-23 DIAGNOSIS — K219 Gastro-esophageal reflux disease without esophagitis: Secondary | ICD-10-CM | POA: Insufficient documentation

## 2016-06-23 DIAGNOSIS — I10 Essential (primary) hypertension: Secondary | ICD-10-CM | POA: Diagnosis not present

## 2016-06-23 HISTORY — PX: CATARACT EXTRACTION W/PHACO: SHX586

## 2016-06-23 LAB — GLUCOSE, CAPILLARY: Glucose-Capillary: 200 mg/dL — ABNORMAL HIGH (ref 65–99)

## 2016-06-23 SURGERY — PHACOEMULSIFICATION, CATARACT, WITH IOL INSERTION
Anesthesia: Monitor Anesthesia Care | Site: Eye | Laterality: Right

## 2016-06-23 MED ORDER — LIDOCAINE HCL (PF) 1 % IJ SOLN
INTRAMUSCULAR | Status: DC | PRN
  Administered 2016-06-23: .9 mL via OPHTHALMIC

## 2016-06-23 MED ORDER — CYCLOPENTOLATE-PHENYLEPHRINE 0.2-1 % OP SOLN
1.0000 [drp] | OPHTHALMIC | Status: AC
  Administered 2016-06-23 (×3): 1 [drp] via OPHTHALMIC

## 2016-06-23 MED ORDER — LACTATED RINGERS IV SOLN
INTRAVENOUS | Status: DC
  Administered 2016-06-23: 1000 mL via INTRAVENOUS

## 2016-06-23 MED ORDER — TETRACAINE HCL 0.5 % OP SOLN
1.0000 [drp] | OPHTHALMIC | Status: AC
  Administered 2016-06-23 (×3): 1 [drp] via OPHTHALMIC

## 2016-06-23 MED ORDER — BSS IO SOLN
INTRAOCULAR | Status: DC | PRN
  Administered 2016-06-23: 15 mL via INTRAOCULAR

## 2016-06-23 MED ORDER — LIDOCAINE HCL 3.5 % OP GEL
OPHTHALMIC | Status: AC
  Filled 2016-06-23: qty 1

## 2016-06-23 MED ORDER — EPINEPHRINE PF 1 MG/ML IJ SOLN
INTRAMUSCULAR | Status: AC
  Filled 2016-06-23: qty 1

## 2016-06-23 MED ORDER — FENTANYL CITRATE (PF) 100 MCG/2ML IJ SOLN
25.0000 ug | INTRAMUSCULAR | Status: DC | PRN
  Administered 2016-06-23: 25 ug via INTRAVENOUS

## 2016-06-23 MED ORDER — NEOMYCIN-POLYMYXIN-DEXAMETH 3.5-10000-0.1 OP SUSP
OPHTHALMIC | Status: DC | PRN
  Administered 2016-06-23: 2 [drp] via OPHTHALMIC

## 2016-06-23 MED ORDER — MIDAZOLAM HCL 2 MG/2ML IJ SOLN
1.0000 mg | INTRAMUSCULAR | Status: DC | PRN
  Administered 2016-06-23: 2 mg via INTRAVENOUS

## 2016-06-23 MED ORDER — LIDOCAINE HCL 3.5 % OP GEL
1.0000 "application " | Freq: Once | OPHTHALMIC | Status: AC
  Administered 2016-06-23: 1 via OPHTHALMIC

## 2016-06-23 MED ORDER — LIDOCAINE 3.5 % OP GEL OPTIME - NO CHARGE
OPHTHALMIC | Status: DC | PRN
  Administered 2016-06-23: 1 [drp] via OPHTHALMIC

## 2016-06-23 MED ORDER — BSS IO SOLN
INTRAOCULAR | Status: DC | PRN
  Administered 2016-06-23: 500 mL

## 2016-06-23 MED ORDER — FENTANYL CITRATE (PF) 100 MCG/2ML IJ SOLN
INTRAMUSCULAR | Status: AC
  Filled 2016-06-23: qty 2

## 2016-06-23 MED ORDER — MIDAZOLAM HCL 2 MG/2ML IJ SOLN
INTRAMUSCULAR | Status: AC
  Filled 2016-06-23: qty 2

## 2016-06-23 MED ORDER — PHENYLEPHRINE HCL 2.5 % OP SOLN
1.0000 [drp] | OPHTHALMIC | Status: AC
  Administered 2016-06-23 (×3): 1 [drp] via OPHTHALMIC

## 2016-06-23 MED ORDER — PROVISC 10 MG/ML IO SOLN
INTRAOCULAR | Status: DC | PRN
Start: 2016-06-23 — End: 2016-06-23
  Administered 2016-06-23: 0.85 mL via INTRAOCULAR

## 2016-06-23 MED ORDER — POVIDONE-IODINE 5 % OP SOLN
OPHTHALMIC | Status: DC | PRN
  Administered 2016-06-23: 1 via OPHTHALMIC

## 2016-06-23 SURGICAL SUPPLY — 13 items
CLOTH BEACON ORANGE TIMEOUT ST (SAFETY) ×2 IMPLANT
EYE SHIELD UNIVERSAL CLEAR (GAUZE/BANDAGES/DRESSINGS) ×2 IMPLANT
GLOVE BIOGEL PI IND STRL 6.5 (GLOVE) ×2 IMPLANT
GLOVE BIOGEL PI INDICATOR 6.5 (GLOVE) ×2
GLOVE EXAM NITRILE MD LF STRL (GLOVE) ×2 IMPLANT
GOWN STRL REUS W/TWL LRG LVL3 (GOWN DISPOSABLE) ×2 IMPLANT
PAD ARMBOARD 7.5X6 YLW CONV (MISCELLANEOUS) ×2 IMPLANT
RING MALYGIN (MISCELLANEOUS) ×2 IMPLANT
SIGHTPATH CAT PROC W REG LENS (Ophthalmic Related) ×2 IMPLANT
SYRINGE LUER LOK 1CC (MISCELLANEOUS) ×2 IMPLANT
TAPE SURG TRANSPORE 1 IN (GAUZE/BANDAGES/DRESSINGS) ×1 IMPLANT
TAPE SURGICAL TRANSPORE 1 IN (GAUZE/BANDAGES/DRESSINGS) ×1
WATER STERILE IRR 250ML POUR (IV SOLUTION) ×2 IMPLANT

## 2016-06-23 NOTE — Transfer of Care (Signed)
Immediate Anesthesia Transfer of Care Note  Patient: Timothy Clark  Procedure(s) Performed: Procedure(s) with comments: CATARACT EXTRACTION PHACO AND INTRAOCULAR LENS PLACEMENT RIGHT EYE CDE=9.87 (Right) - right  Patient Location: Short Stay  Anesthesia Type:MAC  Level of Consciousness: awake, alert , oriented and patient cooperative  Airway & Oxygen Therapy: Patient Spontanous Breathing  Post-op Assessment: Report given to RN, Post -op Vital signs reviewed and stable and Patient moving all extremities  Post vital signs: Reviewed and stable  Last Vitals:  Vitals:   06/23/16 1037 06/23/16 1040  BP: 131/86 132/86  Pulse: 76   Resp: 18 14  Temp: 36.7 C     Last Pain:  Vitals:   06/23/16 1037  TempSrc: Oral      Patients Stated Pain Goal: 8 (82/42/35 3614)  Complications: No apparent anesthesia complications

## 2016-06-23 NOTE — Anesthesia Postprocedure Evaluation (Signed)
Anesthesia Post Note  Patient: RASHED EDLER  Procedure(s) Performed: Procedure(s) (LRB): CATARACT EXTRACTION PHACO AND INTRAOCULAR LENS PLACEMENT RIGHT EYE CDE=9.87 (Right)  Patient location during evaluation: Short Stay Anesthesia Type: MAC Level of consciousness: awake and alert, oriented and patient cooperative Pain management: pain level controlled Vital Signs Assessment: post-procedure vital signs reviewed and stable Respiratory status: spontaneous breathing, nonlabored ventilation and respiratory function stable Cardiovascular status: blood pressure returned to baseline Postop Assessment: no signs of nausea or vomiting Anesthetic complications: no    Last Vitals:  Vitals:   06/23/16 1037 06/23/16 1040  BP: 131/86 132/86  Pulse: 76   Resp: 18 14  Temp: 36.7 C     Last Pain:  Vitals:   06/23/16 1037  TempSrc: Oral                 Josefine Fuhr J

## 2016-06-23 NOTE — Anesthesia Preprocedure Evaluation (Signed)
Anesthesia Evaluation  Patient identified by MRN, date of birth, ID band Patient awake    Reviewed: Allergy & Precautions, NPO status , Patient's Chart, lab work & pertinent test results  Airway Mallampati: II  TM Distance: >3 FB     Dental  (+) Poor Dentition, Edentulous Upper   Pulmonary neg pulmonary ROS,    breath sounds clear to auscultation       Cardiovascular hypertension, Pt. on medications + Peripheral Vascular Disease   Rhythm:Regular Rate:Normal     Neuro/Psych PSYCHIATRIC DISORDERS Depression    GI/Hepatic GERD  ,  Endo/Other  diabetes, Type 2, Oral Hypoglycemic Agents  Renal/GU      Musculoskeletal   Abdominal   Peds  Hematology   Anesthesia Other Findings   Reproductive/Obstetrics                             Anesthesia Physical Anesthesia Plan  ASA: III  Anesthesia Plan: MAC   Post-op Pain Management:    Induction: Intravenous  Airway Management Planned: Nasal Cannula  Additional Equipment:   Intra-op Plan:   Post-operative Plan:   Informed Consent: I have reviewed the patients History and Physical, chart, labs and discussed the procedure including the risks, benefits and alternatives for the proposed anesthesia with the patient or authorized representative who has indicated his/her understanding and acceptance.     Plan Discussed with:   Anesthesia Plan Comments:         Anesthesia Quick Evaluation

## 2016-06-23 NOTE — H&P (Signed)
I have reviewed the H&P, the patient was re-examined, and I have identified no interval changes in medical condition and plan of care since the history and physical of record  

## 2016-06-23 NOTE — Discharge Instructions (Signed)

## 2016-06-23 NOTE — Op Note (Signed)
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  Assistants: None  Anesthesia: Topical with MAC  Indications: Painless, progressive loss of vision with compromise of daily activities.  Surgery: Cataract Extraction with Intraocular lens Implant Right Eye, CPT Code [phone]  Discription: The patient had dilating drops and viscous lidocaine placed into the left eye in the pre-op holding area. After transfer to the operating room, a time out was performed. The patient was then prepped and draped. Beginning with a 58 degree blade a paracentesis port was made at the surgeon's 2 o'clock position. The anterior chamber was then filled with 1% non-preserved lidocaine with epinepherine. This was followed by filling the anterior chamber with Provisc. A Malyugan ring was placed into the anterior chamber using its injector. The loops were positioned with the Kuglan hook. A bent cystatome needle was used to create a continuous tear capsulotomy. Hydrodissection was performed with balanced salt solution on a Fine canula. The lens nucleus was then removed using the phacoemulsification handpiece. Residual cortex was removed with the I&A handpiece. The anterior chamber and capsular bag were refilled with Provisc. A posterior chamber intraocular lens was placed into the capsular bag with it's injector. The implant was positioned with the Kuglan hook. The Malyugan ring was disengaged from the iris margin and removed with its injector. The Provisc was then removed from the anterior chamber and capsular bag with the I&A handpiece. Stromal hydration of the main incision and paracentesis port was performed with BSS on a Fine canula. The wounds were tested for leak which was negative. The patient tolerated the procedure well. There  were no operative complications. The patient was then transferred to the recovery room in stable condition.  Complications: None  Specimen: None  EBL: None  Prosthetic device: [employer] 250, power 17.0, SN [id number].

## 2016-06-24 ENCOUNTER — Encounter (HOSPITAL_COMMUNITY): Payer: Self-pay | Admitting: Ophthalmology

## 2016-09-05 ENCOUNTER — Observation Stay (HOSPITAL_COMMUNITY): Payer: BLUE CROSS/BLUE SHIELD

## 2016-09-05 ENCOUNTER — Inpatient Hospital Stay (HOSPITAL_COMMUNITY)
Admission: EM | Admit: 2016-09-05 | Discharge: 2016-09-07 | DRG: 392 | Disposition: A | Discharge status: Home / Self Care | Payer: BLUE CROSS/BLUE SHIELD | Attending: Internal Medicine | Admitting: Internal Medicine

## 2016-09-05 ENCOUNTER — Encounter (HOSPITAL_COMMUNITY): Payer: Self-pay | Admitting: Emergency Medicine

## 2016-09-05 ENCOUNTER — Emergency Department (HOSPITAL_COMMUNITY)
Admission: EM | Admit: 2016-09-05 | Discharge: 2016-09-05 | Disposition: A | Discharge status: Home / Self Care | Payer: BLUE CROSS/BLUE SHIELD | Source: Home / Self Care | Attending: Emergency Medicine | Admitting: Emergency Medicine

## 2016-09-05 ENCOUNTER — Encounter (HOSPITAL_COMMUNITY): Payer: Self-pay

## 2016-09-05 DIAGNOSIS — H409 Unspecified glaucoma: Secondary | ICD-10-CM | POA: Diagnosis present

## 2016-09-05 DIAGNOSIS — Z882 Allergy status to sulfonamides status: Secondary | ICD-10-CM

## 2016-09-05 DIAGNOSIS — R1013 Epigastric pain: Secondary | ICD-10-CM | POA: Insufficient documentation

## 2016-09-05 DIAGNOSIS — E119 Type 2 diabetes mellitus without complications: Secondary | ICD-10-CM | POA: Insufficient documentation

## 2016-09-05 DIAGNOSIS — R112 Nausea with vomiting, unspecified: Secondary | ICD-10-CM

## 2016-09-05 DIAGNOSIS — Z9842 Cataract extraction status, left eye: Secondary | ICD-10-CM

## 2016-09-05 DIAGNOSIS — Z880 Allergy status to penicillin: Secondary | ICD-10-CM

## 2016-09-05 DIAGNOSIS — J101 Influenza due to other identified influenza virus with other respiratory manifestations: Secondary | ICD-10-CM | POA: Diagnosis present

## 2016-09-05 DIAGNOSIS — J029 Acute pharyngitis, unspecified: Secondary | ICD-10-CM | POA: Insufficient documentation

## 2016-09-05 DIAGNOSIS — R55 Syncope and collapse: Secondary | ICD-10-CM | POA: Diagnosis not present

## 2016-09-05 DIAGNOSIS — Z961 Presence of intraocular lens: Secondary | ICD-10-CM | POA: Diagnosis present

## 2016-09-05 DIAGNOSIS — R109 Unspecified abdominal pain: Secondary | ICD-10-CM | POA: Diagnosis not present

## 2016-09-05 DIAGNOSIS — E860 Dehydration: Secondary | ICD-10-CM | POA: Diagnosis not present

## 2016-09-05 DIAGNOSIS — I10 Essential (primary) hypertension: Secondary | ICD-10-CM | POA: Diagnosis present

## 2016-09-05 DIAGNOSIS — I951 Orthostatic hypotension: Secondary | ICD-10-CM | POA: Diagnosis present

## 2016-09-05 DIAGNOSIS — Z79899 Other long term (current) drug therapy: Secondary | ICD-10-CM

## 2016-09-05 DIAGNOSIS — Z888 Allergy status to other drugs, medicaments and biological substances status: Secondary | ICD-10-CM

## 2016-09-05 DIAGNOSIS — Z9841 Cataract extraction status, right eye: Secondary | ICD-10-CM

## 2016-09-05 DIAGNOSIS — Z881 Allergy status to other antibiotic agents status: Secondary | ICD-10-CM

## 2016-09-05 DIAGNOSIS — Z833 Family history of diabetes mellitus: Secondary | ICD-10-CM

## 2016-09-05 DIAGNOSIS — K3184 Gastroparesis: Principal | ICD-10-CM | POA: Diagnosis present

## 2016-09-05 DIAGNOSIS — E1143 Type 2 diabetes mellitus with diabetic autonomic (poly)neuropathy: Secondary | ICD-10-CM | POA: Diagnosis not present

## 2016-09-05 DIAGNOSIS — Z794 Long term (current) use of insulin: Secondary | ICD-10-CM

## 2016-09-05 DIAGNOSIS — R110 Nausea: Secondary | ICD-10-CM

## 2016-09-05 DIAGNOSIS — R509 Fever, unspecified: Secondary | ICD-10-CM

## 2016-09-05 DIAGNOSIS — R05 Cough: Secondary | ICD-10-CM | POA: Insufficient documentation

## 2016-09-05 DIAGNOSIS — IMO0001 Reserved for inherently not codable concepts without codable children: Secondary | ICD-10-CM

## 2016-09-05 DIAGNOSIS — K219 Gastro-esophageal reflux disease without esophagitis: Secondary | ICD-10-CM | POA: Diagnosis present

## 2016-09-05 DIAGNOSIS — N179 Acute kidney failure, unspecified: Secondary | ICD-10-CM | POA: Diagnosis present

## 2016-09-05 LAB — INFLUENZA PANEL BY PCR (TYPE A & B)
Influenza A By PCR: POSITIVE — AB
Influenza B By PCR: NEGATIVE

## 2016-09-05 LAB — COMPREHENSIVE METABOLIC PANEL
ALT: 20 U/L (ref 17–63)
AST: 20 U/L (ref 15–41)
Albumin: 3.5 g/dL (ref 3.5–5.0)
Alkaline Phosphatase: 74 U/L (ref 38–126)
Anion gap: 8 (ref 5–15)
BUN: 22 mg/dL — ABNORMAL HIGH (ref 6–20)
CO2: 25 mmol/L (ref 22–32)
Calcium: 9.1 mg/dL (ref 8.9–10.3)
Chloride: 101 mmol/L (ref 101–111)
Creatinine, Ser: 1.31 mg/dL — ABNORMAL HIGH (ref 0.61–1.24)
GFR calc Af Amer: >60 mL/min (ref >60)
GFR calc non Af Amer: >60 mL/min (ref >60)
Glucose, Bld: 268 mg/dL — ABNORMAL HIGH (ref 65–99)
Potassium: 3.9 mmol/L (ref 3.5–5.1)
Sodium: 134 mmol/L — ABNORMAL LOW (ref 135–145)
Total Bilirubin: 0.9 mg/dL (ref 0.3–1.2)
Total Protein: 7.1 g/dL (ref 6.5–8.1)

## 2016-09-05 LAB — I-STAT CHEM 8, ED
BUN: 26 mg/dL — ABNORMAL HIGH (ref 6–20)
Calcium, Ion: 1.08 mmol/L — ABNORMAL LOW (ref 1.15–1.40)
Chloride: 102 mmol/L (ref 101–111)
Creatinine, Ser: 1.2 mg/dL (ref 0.61–1.24)
Glucose, Bld: 260 mg/dL — ABNORMAL HIGH (ref 65–99)
HCT: 36 % — ABNORMAL LOW (ref 39.0–52.0)
Hemoglobin: 12.2 g/dL — ABNORMAL LOW (ref 13.0–17.0)
Potassium: 5.4 mmol/L — ABNORMAL HIGH (ref 3.5–5.1)
Sodium: 137 mmol/L (ref 135–145)
TCO2: 28 mmol/L (ref 0–100)

## 2016-09-05 LAB — CBC WITH DIFFERENTIAL/PLATELET
Basophils Absolute: 0 10*3/uL (ref 0.0–0.1)
Basophils Relative: 0 %
Eosinophils Absolute: 0.1 10*3/uL (ref 0.0–0.7)
Eosinophils Relative: 1 %
HCT: 37.5 % — ABNORMAL LOW (ref 39.0–52.0)
Hemoglobin: 13.1 g/dL (ref 13.0–17.0)
Lymphocytes Relative: 11 %
Lymphs Abs: 0.9 10*3/uL (ref 0.7–4.0)
MCH: 30.8 pg (ref 26.0–34.0)
MCHC: 34.9 g/dL (ref 30.0–36.0)
MCV: 88.2 fL (ref 78.0–100.0)
Monocytes Absolute: 0.9 10*3/uL (ref 0.1–1.0)
Monocytes Relative: 10 %
Neutro Abs: 6.8 10*3/uL (ref 1.7–7.7)
Neutrophils Relative %: 78 %
Platelets: 147 10*3/uL — ABNORMAL LOW (ref 150–400)
RBC: 4.25 MIL/uL (ref 4.22–5.81)
RDW: 12.4 % (ref 11.5–15.5)
WBC: 8.7 10*3/uL (ref 4.0–10.5)

## 2016-09-05 LAB — CBG MONITORING, ED: Glucose-Capillary: 237 mg/dL — ABNORMAL HIGH (ref 65–99)

## 2016-09-05 LAB — LIPASE, BLOOD: Lipase: 20 U/L (ref 11–51)

## 2016-09-05 MED ORDER — INSULIN ASPART 100 UNIT/ML ~~LOC~~ SOLN
0.0000 [IU] | Freq: Three times a day (TID) | SUBCUTANEOUS | Status: DC
  Administered 2016-09-06: 5 [IU] via SUBCUTANEOUS
  Administered 2016-09-06 – 2016-09-07 (×3): 3 [IU] via SUBCUTANEOUS
  Administered 2016-09-07: 2 [IU] via SUBCUTANEOUS

## 2016-09-05 MED ORDER — ATORVASTATIN CALCIUM 10 MG PO TABS
10.0000 mg | ORAL_TABLET | Freq: Every day | ORAL | Status: DC
  Administered 2016-09-06: 10 mg via ORAL
  Filled 2016-09-05: qty 1

## 2016-09-05 MED ORDER — DIPHENHYDRAMINE HCL 50 MG/ML IJ SOLN
50.0000 mg | Freq: Once | INTRAMUSCULAR | Status: AC
  Administered 2016-09-05: 50 mg via INTRAVENOUS
  Filled 2016-09-05: qty 1

## 2016-09-05 MED ORDER — TIMOLOL MALEATE 0.5 % OP SOLN
1.0000 [drp] | Freq: Two times a day (BID) | OPHTHALMIC | Status: DC
  Administered 2016-09-05 – 2016-09-07 (×4): 1 [drp] via OPHTHALMIC
  Filled 2016-09-05 (×2): qty 5

## 2016-09-05 MED ORDER — CYCLOBENZAPRINE HCL 10 MG PO TABS
10.0000 mg | ORAL_TABLET | Freq: Every day | ORAL | Status: DC
  Administered 2016-09-05 – 2016-09-06 (×2): 10 mg via ORAL
  Filled 2016-09-05 (×2): qty 1

## 2016-09-05 MED ORDER — METOCLOPRAMIDE HCL 5 MG/ML IJ SOLN
10.0000 mg | Freq: Once | INTRAMUSCULAR | Status: AC
Start: 2016-09-05 — End: 2016-09-05
  Administered 2016-09-05: 10 mg via INTRAVENOUS
  Filled 2016-09-05: qty 2

## 2016-09-05 MED ORDER — PANTOPRAZOLE SODIUM 40 MG PO TBEC
40.0000 mg | DELAYED_RELEASE_TABLET | Freq: Every day | ORAL | Status: DC
  Administered 2016-09-06 – 2016-09-07 (×3): 40 mg via ORAL
  Filled 2016-09-05 (×3): qty 1

## 2016-09-05 MED ORDER — GABAPENTIN 300 MG PO CAPS
600.0000 mg | ORAL_CAPSULE | Freq: Four times a day (QID) | ORAL | Status: DC | PRN
  Administered 2016-09-06: 600 mg via ORAL
  Filled 2016-09-05: qty 2

## 2016-09-05 MED ORDER — OSELTAMIVIR PHOSPHATE 75 MG PO CAPS
75.0000 mg | ORAL_CAPSULE | Freq: Two times a day (BID) | ORAL | Status: DC
  Administered 2016-09-05 – 2016-09-07 (×4): 75 mg via ORAL
  Filled 2016-09-05 (×4): qty 1

## 2016-09-05 MED ORDER — ACETAMINOPHEN 325 MG PO TABS
650.0000 mg | ORAL_TABLET | Freq: Four times a day (QID) | ORAL | Status: DC | PRN
  Administered 2016-09-05 – 2016-09-06 (×2): 650 mg via ORAL
  Filled 2016-09-05 (×2): qty 2

## 2016-09-05 MED ORDER — SODIUM CHLORIDE 0.9 % IV BOLUS (SEPSIS)
1000.0000 mL | Freq: Once | INTRAVENOUS | Status: AC
Start: 2016-09-05 — End: 2016-09-05
  Administered 2016-09-05: 1000 mL via INTRAVENOUS

## 2016-09-05 MED ORDER — SODIUM CHLORIDE 0.9 % IV BOLUS (SEPSIS)
1000.0000 mL | Freq: Once | INTRAVENOUS | Status: AC
  Administered 2016-09-05: 1000 mL via INTRAVENOUS

## 2016-09-05 MED ORDER — SODIUM CHLORIDE 0.9 % IV SOLN
INTRAVENOUS | Status: DC
  Administered 2016-09-05: 23:00:00 via INTRAVENOUS
  Administered 2016-09-06: 125 mL/h via INTRAVENOUS
  Administered 2016-09-06: 09:00:00 via INTRAVENOUS
  Administered 2016-09-07: 125 mL/h via INTRAVENOUS

## 2016-09-05 MED ORDER — LORAZEPAM 1 MG PO TABS: 1.0000 mg | ORAL_TABLET | Freq: Once | ORAL | Status: DC

## 2016-09-05 MED ORDER — SUCRALFATE 1 G PO TABS
1.0000 g | ORAL_TABLET | Freq: Two times a day (BID) | ORAL | Status: DC
  Administered 2016-09-05 – 2016-09-07 (×4): 1 g via ORAL
  Filled 2016-09-05 (×4): qty 1

## 2016-09-05 MED ORDER — SODIUM CHLORIDE 0.9 % IV SOLN: Freq: Once | INTRAVENOUS | Status: DC

## 2016-09-05 MED ORDER — METOCLOPRAMIDE HCL 5 MG/ML IJ SOLN
10.0000 mg | Freq: Three times a day (TID) | INTRAMUSCULAR | Status: DC
  Administered 2016-09-05 – 2016-09-07 (×5): 10 mg via INTRAVENOUS
  Filled 2016-09-05 (×5): qty 2

## 2016-09-05 MED ORDER — LORAZEPAM 2 MG/ML IJ SOLN
1.0000 mg | Freq: Once | INTRAMUSCULAR | Status: AC
  Administered 2016-09-05: 1 mg via INTRAVENOUS
  Filled 2016-09-05: qty 1

## 2016-09-05 MED ORDER — METOCLOPRAMIDE HCL 5 MG/ML IJ SOLN
10.0000 mg | Freq: Once | INTRAMUSCULAR | Status: AC
  Administered 2016-09-05: 10 mg via INTRAVENOUS
  Filled 2016-09-05: qty 2

## 2016-09-05 MED ORDER — BRIMONIDINE TARTRATE 0.2 % OP SOLN
1.0000 [drp] | Freq: Three times a day (TID) | OPHTHALMIC | Status: DC
  Administered 2016-09-06 – 2016-09-07 (×4): 1 [drp] via OPHTHALMIC
  Filled 2016-09-05: qty 5

## 2016-09-05 MED ORDER — MORPHINE SULFATE (PF) 4 MG/ML IV SOLN
4.0000 mg | Freq: Once | INTRAVENOUS | Status: AC
  Administered 2016-09-05: 4 mg via INTRAVENOUS
  Filled 2016-09-05: qty 1

## 2016-09-05 NOTE — ED Triage Notes (Signed)
Pt presents to ED with nausea and vomiting.  Was seen this morning for same.

## 2016-09-05 NOTE — ED Provider Notes (Signed)
Emergency Department Provider Note   I have reviewed the triage vital signs and the nursing notes.   HISTORY  Chief Complaint Emesis   HPI Timothy Clark is a 46 y.o. male with PMH of chronic abdominal pain and gastroparesis presents to the emergency department for evaluation of intractable nausea and vomiting with epigastric abdominal pain. Patient was seen in the emergency department earlier today her labs were drawn and he was given IV fluids. Patient was feeling better at discharge and tolerating PO. Since returning home his symptoms have acutely worsened. He has epigastric discomfort and severe nausea and vomiting.Patient states he's not had an episode of gastroparesis in the last year but previously had followed with the gastroenterologist at [facility]. The patient's wife at bedside states that they found him on the floor earlier this evening after an apparent syncopal episode. Patient's had multiple falls since then including one outside the emergency department. He denies significant headache, difficulty breathing, chest pain.     Past Medical History:  Diagnosis Date  . Chronic abdominal pain   . Esophagitis   . Eye hemorrhage   . Gastritis   . Gastroparesis   . GERD (gastroesophageal reflux disease)   . Glaucoma   . Helicobacter pylori (H. pylori) infection   . Hiccups   . Hypertension   . Nausea and vomiting    chronic, recurrent  . Type 2 diabetes mellitus with complications (HCC)    diagnosed around age 62    Patient Active Problem List   Diagnosis Date Noted  . Orthostatic syncope 09/05/2016  . Syncope and collapse   . 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 Diarrhea 09/27/2015  . Gastroparesis 02/17/2015  . Nausea & vomiting 02/17/2015  . Dehydration 02/17/2015  . Gastroparesis due to DM ([place]) 02/06/2015  . Intractable vomiting 01/15/2015  . Gastritis   . Essential hypertension 12/06/2014  . H. pylori infection 11/27/2014  . Acute esophagitis 10/29/2014  .  Abdominal pain, epigastric   . Mucosal abnormality of stomach   . Mucosal abnormality of esophagus   . Intractable vomiting with nausea 10/25/2014  . Elevated blood pressure 10/25/2014  . Nausea vomiting and diarrhea 10/25/2014  . DIABETIC FOOT ULCER 02/14/2010  . LIMB PAIN 12/11/2009  . UNSPECIFIED PERIPHERAL VASCULAR DISEASE 10/19/2009  . ERECTILE DYSFUNCTION, ORGANIC 10/19/2009  . NUMBNESS 07/20/2009  . Diabetes mellitus type 2 with complications, uncontrolled ([place]) 03/22/2009  . HYPERLIPIDEMIA 03/22/2009  . DEPRESSIVE DISORDER 03/22/2009    Past Surgical History:  Procedure Laterality Date  . CATARACT EXTRACTION W/PHACO Left 05/19/2016   Procedure: CATARACT EXTRACTION PHACO AND INTRAOCULAR LENS PLACEMENT LEFT EYE;  Surgeon: Tonny Branch, MD;  Location: [facility];  Service: Ophthalmology;  Laterality: Left;  CDE: 7.30  . CATARACT EXTRACTION W/PHACO Right 06/23/2016   Procedure: CATARACT EXTRACTION PHACO AND INTRAOCULAR LENS PLACEMENT RIGHT EYE CDE=9.87;  Surgeon: Tonny Branch, MD;  Location: [facility];  Service: Ophthalmology;  Laterality: Right;  right  . ESOPHAGOGASTRODUODENOSCOPY  2015   Dr. Britta Mccreedy  . ESOPHAGOGASTRODUODENOSCOPY N/A 10/26/2014   RMR: Distal esophagititis-likely reflux related although an element of pill induced injuruy no exclueded  status post biopsy. Diffusely abnormal gastric mucosa of uncertain signigicane -status post gastric biopsy. Focal area of excoriation in the cardia most consistant with a trauma of heaving.   [name] EYE SURGERY  2015   stent placed to left eye  . EYE SURGERY        Allergies Donnatal [pb-hyoscy-atropine-scopol er]; Fish allergy; Haldol [haloperidol lactate]; Lidocaine; Maalox [  calcium carbonate antacid]; Aspirin; Bactrim [sulfamethoxazole-trimethoprim]; Doxycycline; Penicillins; Phenergan [promethazine hcl]; and Zofran [ondansetron hcl]  Family History  Problem Relation Age of Onset  . Ovarian cancer Mother   . Cervical cancer Sister   .  Diabetes Sister   . Colon cancer Neg Hx     Social History Social History  Substance Use Topics  . Smoking status: Never Smoker  . Smokeless tobacco: Never Used  . Alcohol use No    Review of Systems  Constitutional: No fever/chills Eyes: No visual changes. ENT: No sore throat. Cardiovascular: Denies chest pain. Positive syncope.  Respiratory: Denies shortness of breath. Gastrointestinal: Positive abdominal pain. Positive nausea and vomiting.  No diarrhea.  No constipation. Genitourinary: Negative for dysuria. Musculoskeletal: Negative for back pain. Skin: Negative for rash. Neurological: Negative for headaches, focal weakness or numbness.  10-point ROS otherwise negative.  ____________________________________________   PHYSICAL EXAM:  VITAL SIGNS: ED Triage Vitals  Enc Vitals Group     BP 09/05/16 1800 (!) 132/105     Pulse Rate 09/05/16 1800 105     Resp 09/05/16 1800 22     Temp 09/05/16 1800 100.6 F (38.1 C)     Temp Source 09/05/16 1800 Oral     SpO2 09/05/16 1800 98 %     Weight 09/05/16 1732 300 lb (136.1 kg)     Height 09/05/16 1732 6' (1.829 m)   Constitutional: Appears acutely uncomfortable with active dry heaving. Eyes: Conjunctivae are normal. Head: Atraumatic. Nose: No congestion/rhinnorhea. Mouth/Throat: Mucous membranes are very dry.  Neck: No stridor.   Cardiovascular: Tachycardia. Good peripheral circulation. Grossly normal heart sounds.   Respiratory: Normal respiratory effort.  No retractions. Lungs CTAB. Gastrointestinal: Soft with diffuse mild tenderness. No rebound or guarding.  No distention.  Musculoskeletal: No lower extremity tenderness nor edema. No gross deformities of extremities. Neurologic:  Normal speech and language. No gross focal neurologic deficits are appreciated.  Skin:  Skin is warm, dry and intact. No rash noted. Psychiatric: Mood and affect are normal. Speech and behavior are  normal.  ____________________________________________   LABS (all labs ordered are listed, but only abnormal results are displayed)  Labs Reviewed  BASIC METABOLIC PANEL - Abnormal; Notable for the following:       Result Value   Glucose, Bld 252 (*)    Calcium 8.3 (*)    All other components within normal limits  CBC - Abnormal; Notable for the following:    RBC 4.03 (*)    Hemoglobin 12.6 (*)    HCT 35.9 (*)    Platelets 137 (*)    All other components within normal limits  INFLUENZA PANEL BY PCR (TYPE A & B) - Abnormal; Notable for the following:    Influenza A By PCR POSITIVE (*)    All other components within normal limits  GLUCOSE, CAPILLARY - Abnormal; Notable for the following:    Glucose-Capillary 246 (*)    All other components within normal limits  CBG MONITORING, ED - Abnormal; Notable for the following:    Glucose-Capillary 237 (*)    All other components within normal limits  I-STAT CHEM 8, ED - Abnormal; Notable for the following:    Potassium 5.4 (*)    BUN 26 (*)    Glucose, Bld 260 (*)    Calcium, Ion 1.08 (*)    Hemoglobin 12.2 (*)    HCT 36.0 (*)    All other components within normal limits   ____________________________________________  EKG  Rate: 115  PR: 160 QTc: 448 Sinus tachycardia. Narrow QRS. No ST elevation or depression. No STEMI. Recorded in MUSE.  ____________________________________________  RADIOLOGY  Dg Chest Port 1 View  Result Date: 09/05/2016 CLINICAL DATA:  46 year old male with fever and nausea and vomiting. EXAM: PORTABLE CHEST 1 VIEW COMPARISON:  Chest radiograph dated 03/18/2015 FINDINGS: There is shallow inspiration with minimal bibasilar atelectatic changes. There is no focal consolidation, pleural effusion, or pneumothorax. The cardiac silhouette is within normal limits. No acute osseous pathology. IMPRESSION: No active disease. Electronically Signed   By: Anner Crete M.D.   On: 09/05/2016 23:52     ____________________________________________   PROCEDURES  Procedure(s) performed:   Procedures  None ____________________________________________   INITIAL IMPRESSION / ASSESSMENT AND PLAN / ED COURSE  Pertinent labs & imaging results that were available during my care of the patient were reviewed by me and considered in my medical decision making (see chart for details).  Agent presents to the emergency department for evaluation of intractable nausea, vomiting, and epigastric abdominal pain. The patient said multiple syncopal episodes today since leaving the emergency department initially.  07:22 PM Patient is resting comfortably at this time. No acute distress. Near syncope on standing for orthostatic vital signs. No clear reason for fever. Low suspicion for intrabdominal infectious process. Possible flu. Plan for admission for additional IVF and treatment of nausea/vomiting.   07:28 PM Discussed patient's case with hospitalist, Dr. Shanon Brow.  Recommend admission to obs, telemetry bed.  I will place holding orders per their request. Patient and family (if present) updated with plan. Care transferred to hospitalist service.  I reviewed all nursing notes, vitals, pertinent old records, EKGs, labs, imaging (as available).  ____________________________________________  FINAL CLINICAL IMPRESSION(S) / ED DIAGNOSES  Final diagnoses:  Non-intractable vomiting with nausea, unspecified vomiting type  Syncope and collapse     MEDICATIONS GIVEN DURING THIS VISIT:  Medications  gabapentin (NEURONTIN) capsule 600 mg (600 mg Oral Given 09/06/16 0846)  pantoprazole (PROTONIX) EC tablet 40 mg (40 mg Oral Given 09/06/16 0840)  sucralfate (CARAFATE) tablet 1 g (1 g Oral Given 09/06/16 0840)  cyclobenzaprine (FLEXERIL) tablet 10 mg (10 mg Oral Given 09/05/16 2307)  atorvastatin (LIPITOR) tablet 10 mg (not administered)  timolol (TIMOPTIC) 0.5 % ophthalmic solution 1 drop (1 drop Both Eyes  Given 09/06/16 0841)  brimonidine (ALPHAGAN) 0.2 % ophthalmic solution 1 drop (1 drop Both Eyes Not Given 09/05/16 2200)  0.9 %  sodium chloride infusion ( Intravenous New Bag/Given 09/06/16 0843)  insulin aspart (novoLOG) injection 0-9 Units (3 Units Subcutaneous Given 09/06/16 0840)  metoCLOPramide (REGLAN) injection 10 mg (10 mg Intravenous Given 09/06/16 0439)  acetaminophen (TYLENOL) tablet 650 mg (650 mg Oral Given 09/06/16 0440)  oseltamivir (TAMIFLU) capsule 75 mg (75 mg Oral Given 09/06/16 0840)  sodium chloride 0.9 % bolus 1,000 mL (0 mLs Intravenous Stopped 09/05/16 2000)  metoCLOPramide (REGLAN) injection 10 mg (10 mg Intravenous Given 09/05/16 1841)  diphenhydrAMINE (BENADRYL) injection 50 mg (50 mg Intravenous Given 09/05/16 1841)  LORazepam (ATIVAN) injection 1 mg (1 mg Intravenous Given 09/05/16 1841)     NEW OUTPATIENT MEDICATIONS STARTED DURING THIS VISIT:  None   Note:  This document was prepared using Dragon voice recognition software and may include unintentional dictation errors.  Nanda Quinton, MD Emergency Medicine   Margette Fast, MD 09/06/16 [phone]

## 2016-09-05 NOTE — Discharge Instructions (Signed)
Take your normal stomach medications. Clear liquids for several hours. Follow-up your primary care doctor.

## 2016-09-05 NOTE — ED Triage Notes (Signed)
Pt reports upper abd pain onset last night with vomiting, cold chills, sore throat, cough.

## 2016-09-05 NOTE — ED Notes (Signed)
Upon standing during orthostatic vitals, patient's knees buckled, but pt was caught by tech and sister and lowered to knees. Pt put back into bed with the help of RN Megan and EMS.

## 2016-09-05 NOTE — ED Notes (Signed)
Pt retching in blue bag.  Trying to climb out of bed.  Significant other with pt.  Will keep in hallway to avoid fall.

## 2016-09-05 NOTE — ED Provider Notes (Signed)
[facility] DEPT Provider Note   CSN: [id number] Arrival date & time: 09/05/16  0532     History   Chief Complaint Chief Complaint  Patient presents with  . Abdominal Pain    HPI Timothy Clark is a 46 y.o. male.  Epigastric pain since approximately 2 PM this morning with associated chills. Patient has been eating and drinking. One episode of vomiting. Past medical history includes gastroparesis secondary to diabetes for which she takes Carafate and Protonix.  No chest pain or dyspnea. Review systems positive for sore throat and cough. Severity of illness is mild to moderate.      Past Medical History:  Diagnosis Date  . Chronic abdominal pain   . Esophagitis   . Eye hemorrhage   . Gastritis   . Gastroparesis   . GERD (gastroesophageal reflux disease)   . Glaucoma   . Helicobacter pylori (H. pylori) infection   . Hiccups   . Hypertension   . Nausea and vomiting    chronic, recurrent  . Type 2 diabetes mellitus with complications (HCC)    diagnosed around age 66    Patient Active Problem List   Diagnosis Date Noted  . 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 Diarrhea 09/27/2015  . Gastroparesis 02/17/2015  . Nausea & vomiting 02/17/2015  . Dehydration 02/17/2015  . Gastroparesis due to DM ([place]) 02/06/2015  . Intractable vomiting 01/15/2015  . Gastritis   . Essential hypertension 12/06/2014  . H. pylori infection 11/27/2014  . Acute esophagitis 10/29/2014  . Abdominal pain, epigastric   . Mucosal abnormality of stomach   . Mucosal abnormality of esophagus   . Intractable vomiting with nausea 10/25/2014  . Elevated blood pressure 10/25/2014  . Nausea vomiting and diarrhea 10/25/2014  . DIABETIC FOOT ULCER 02/14/2010  . LIMB PAIN 12/11/2009  . UNSPECIFIED PERIPHERAL VASCULAR DISEASE 10/19/2009  . ERECTILE DYSFUNCTION, ORGANIC 10/19/2009  . NUMBNESS 07/20/2009  . Diabetes mellitus type 2 with complications, uncontrolled ([place]) 03/22/2009  . HYPERLIPIDEMIA  03/22/2009  . DEPRESSIVE DISORDER 03/22/2009    Past Surgical History:  Procedure Laterality Date  . CATARACT EXTRACTION W/PHACO Left 05/19/2016   Procedure: CATARACT EXTRACTION PHACO AND INTRAOCULAR LENS PLACEMENT LEFT EYE;  Surgeon: Tonny Branch, MD;  Location: [facility];  Service: Ophthalmology;  Laterality: Left;  CDE: 7.30  . CATARACT EXTRACTION W/PHACO Right 06/23/2016   Procedure: CATARACT EXTRACTION PHACO AND INTRAOCULAR LENS PLACEMENT RIGHT EYE CDE=9.87;  Surgeon: Tonny Branch, MD;  Location: [facility];  Service: Ophthalmology;  Laterality: Right;  right  . ESOPHAGOGASTRODUODENOSCOPY  2015   Dr. Britta Mccreedy  . ESOPHAGOGASTRODUODENOSCOPY N/A 10/26/2014   RMR: Distal esophagititis-likely reflux related although an element of pill induced injuruy no exclueded  status post biopsy. Diffusely abnormal gastric mucosa of uncertain signigicane -status post gastric biopsy. Focal area of excoriation in the cardia most consistant with a trauma of heaving.   [name] EYE SURGERY  2015   stent placed to left eye  . EYE SURGERY         Home Medications    Prior to Admission medications   Medication Sig Start Date End Date Taking? Authorizing Provider  amLODipine (NORVASC) 5 MG tablet Take 5 mg by mouth daily.    Historical Provider, MD  atorvastatin (LIPITOR) 10 MG tablet Take 10 mg by mouth daily.    Historical Provider, MD  brimonidine-timolol (COMBIGAN) 0.2-0.5 % ophthalmic solution Place 1 drop into both eyes at bedtime.    Historical Provider, MD  cyclobenzaprine (FLEXERIL) 10 MG tablet  Take 10 mg by mouth 3 (three) times daily as needed for muscle spasms.    Historical Provider, MD  gabapentin (NEURONTIN) 600 MG tablet Take 600 mg by mouth daily as needed (for neuropathy).     Historical Provider, MD  hydrochlorothiazide (HYDRODIURIL) 25 MG tablet Take 25 mg by mouth daily.    Historical Provider, MD  insulin aspart protamine- aspart (NOVOLOG MIX 70/30) (70-30) 100 UNIT/ML injection Inject 0.15 mLs (15 Units  total) into the skin 3 (three) times daily. Patient taking differently: Inject 35 Units into the skin 2 (two) times daily with a meal.  02/20/15   Orvan Falconer, MD  insulin detemir (LEVEMIR) 100 UNIT/ML injection Inject 30 Units into the skin at bedtime.    Historical Provider, MD  ketorolac (ACULAR) 0.4 % SOLN Place 1 drop into the right eye 2 (two) times daily. 06/13/16   Historical Provider, MD  lisinopril (PRINIVIL,ZESTRIL) 40 MG tablet Take 40 mg by mouth daily.    Historical Provider, MD  LORazepam (ATIVAN) 1 MG tablet Take 1 tablet (1 mg total) by mouth 3 (three) times daily as needed for anxiety. 09/08/15   Evalee Jefferson, PA-C  ofloxacin (OCUFLOX) 0.3 % ophthalmic solution Place 1 drop into the right eye 4 (four) times daily. 06/09/16   Historical Provider, MD  pantoprazole (PROTONIX) 40 MG tablet Take 1 tablet (40 mg total) by mouth 2 (two) times daily before a meal. Patient taking differently: Take 40 mg by mouth daily.  06/14/15   Mahala Menghini, PA-C  Phenyleph-Doxylamine-DM-APAP (NYQUIL SEVERE COLD/FLU) 5-6.25-10-325 MG/15ML LIQD Take 15-30 mLs by mouth 3 (three) times daily as needed (for cold symptoms.).    Historical Provider, MD  prednisoLONE acetate (PRED FORTE) 1 % ophthalmic suspension Place 1 drop into the right eye 3 (three) times daily. 06/13/16   Historical Provider, MD  saxagliptin HCl (ONGLYZA) 5 MG TABS tablet Take 5 mg by mouth daily.    Historical Provider, MD  sucralfate (CARAFATE) 1 g tablet TAKE ONE TABLET BY MOUTH 4 TIMES DAILY Patient taking differently: bid 03/12/16   Annitta Needs, NP  travoprost, benzalkonium, (TRAVATAN) 0.004 % ophthalmic solution Place 1 drop into the right eye at bedtime.    Historical Provider, MD    Family History Family History  Problem Relation Age of Onset  . Ovarian cancer Mother   . Cervical cancer Sister   . Diabetes Sister   . Colon cancer Neg Hx     Social History Social History  Substance Use Topics  . Smoking status: Never Smoker  .  Smokeless tobacco: Never Used  . Alcohol use No     Allergies   Donnatal [pb-hyoscy-atropine-scopol er]; Fish allergy; Haldol [haloperidol lactate]; Lidocaine; Maalox [calcium carbonate antacid]; Aspirin; Bactrim [sulfamethoxazole-trimethoprim]; Doxycycline; Penicillins; Phenergan [promethazine hcl]; and Zofran [ondansetron hcl]   Review of Systems Review of Systems  All other systems reviewed and are negative.    Physical Exam Updated Vital Signs BP 151/92 (BP Location: Left Arm)   Pulse 96   Temp 99.5 F (37.5 C) (Oral)   Resp 16   Ht 6' (1.829 m)   Wt 300 lb (136.1 kg)   SpO2 98%   BMI 40.69 kg/m   Physical Exam  Constitutional: He is oriented to person, place, and time.  Obese, no acute distress  HENT:  Head: Normocephalic and atraumatic.  Eyes: Conjunctivae are normal.  Neck: Neck supple.  Cardiovascular: Normal rate and regular rhythm.   Pulmonary/Chest: Effort normal and breath sounds  normal.  Abdominal:  Minimal epigastric tenderness  Musculoskeletal: Normal range of motion.  Neurological: He is alert and oriented to person, place, and time.  Skin: Skin is warm and dry.  Psychiatric: He has a normal mood and affect. His behavior is normal.  Nursing note and vitals reviewed.    ED Treatments / Results  Labs (all labs ordered are listed, but only abnormal results are displayed) Labs Reviewed  CBC WITH DIFFERENTIAL/PLATELET - Abnormal; Notable for the following:       Result Value   HCT 37.5 (*)    Platelets 147 (*)    All other components within normal limits  COMPREHENSIVE METABOLIC PANEL - Abnormal; Notable for the following:    Sodium 134 (*)    Glucose, Bld 268 (*)    BUN 22 (*)    Creatinine, Ser 1.31 (*)    All other components within normal limits  LIPASE, BLOOD    EKG  EKG Interpretation None       Radiology No results found.  Procedures Procedures (including critical care time)  Medications Ordered in ED Medications    sodium chloride 0.9 % bolus 1,000 mL (1,000 mLs Intravenous New Bag/Given 09/05/16 0812)  metoCLOPramide (REGLAN) injection 10 mg (10 mg Intravenous Given 09/05/16 0810)  morphine 4 MG/ML injection 4 mg (4 mg Intravenous Given 09/05/16 0811)     Initial Impression / Assessment and Plan / ED Course  I have reviewed the triage vital signs and the nursing notes.  Pertinent labs & imaging results that were available during my care of the patient were reviewed by me and considered in my medical decision making (see chart for details).    No acute abdomen. Patient feels better after IV fluids, IV Reglan, IV morphine. He is slightly hyperglycemic but not in DKA. White count and liver functions are normal.   Final Clinical Impressions(s) / ED Diagnoses   Final diagnoses:  Epigastric pain    New Prescriptions New Prescriptions   No medications on file     [name], MD 09/05/16 [phone]

## 2016-09-05 NOTE — ED Triage Notes (Signed)
Pt was found on the ground by triage nurse and was brought back to treatment area.  Pt immediately was able to follow commands and ask for pain and nausea medications.

## 2016-09-05 NOTE — H&P (Signed)
History and Physical    EDWORD CU VVO:[id number] DOB: 1971-08-05 DOA: 09/05/2016  PCP: Jeri Modena  Patient coming from: home  Chief Complaint:  Vomiting and passing out  HPI: Timothy Clark is a 46 y.o. male with medical history significant of IDDM, gastroparesis, chronic abdominal pain comes in with one day of not feeling well.  This is his second ER visit today.  He reports yesterday he started feeling weak, malaise, with nasal congestion and has had uncontrollable vomiting since.  This feels similar to his previous gastroparesis flares which has not happened in a year, but this time he has been having cold symptoms.  No pain anywhere.  No diarrhea.  No dysuria.  Having nasal congestion, cough for a day.  Every time he stands up he gets weak and has passed out several times.  Pt referred for admission for orthostatic hypotension.   Review of Systems: As per HPI otherwise 10 point review of systems negative.   Past Medical History:  Diagnosis Date  . Chronic abdominal pain   . Esophagitis   . Eye hemorrhage   . Gastritis   . Gastroparesis   . GERD (gastroesophageal reflux disease)   . Glaucoma   . Helicobacter pylori (H. pylori) infection   . Hiccups   . Hypertension   . Nausea and vomiting    chronic, recurrent  . Type 2 diabetes mellitus with complications (HCC)    diagnosed around age 64    Past Surgical History:  Procedure Laterality Date  . CATARACT EXTRACTION W/PHACO Left 05/19/2016   Procedure: CATARACT EXTRACTION PHACO AND INTRAOCULAR LENS PLACEMENT LEFT EYE;  Surgeon: Tonny Branch, MD;  Location: [facility];  Service: Ophthalmology;  Laterality: Left;  CDE: 7.30  . CATARACT EXTRACTION W/PHACO Right 06/23/2016   Procedure: CATARACT EXTRACTION PHACO AND INTRAOCULAR LENS PLACEMENT RIGHT EYE CDE=9.87;  Surgeon: Tonny Branch, MD;  Location: [facility];  Service: Ophthalmology;  Laterality: Right;  right  . ESOPHAGOGASTRODUODENOSCOPY  2015   Dr. Britta Mccreedy  .  ESOPHAGOGASTRODUODENOSCOPY N/A 10/26/2014   RMR: Distal esophagititis-likely reflux related although an element of pill induced injuruy no exclueded  status post biopsy. Diffusely abnormal gastric mucosa of uncertain signigicane -status post gastric biopsy. Focal area of excoriation in the cardia most consistant with a trauma of heaving.   [name] EYE SURGERY  2015   stent placed to left eye  . EYE SURGERY       reports that he has never smoked. He has never used smokeless tobacco. He reports that he does not drink alcohol or use drugs.  Allergies all reviewed  Family History  Problem Relation Age of Onset  . Ovarian cancer Mother   . Cervical cancer Sister   . Diabetes Sister   . Colon cancer Neg Hx     Prior to Admission medications   Medication Sig Start Date End Date Taking? Authorizing Provider  amLODipine (NORVASC) 5 MG tablet Take 5 mg by mouth daily.   Yes Historical Provider, MD  atorvastatin (LIPITOR) 10 MG tablet Take 10 mg by mouth daily.   Yes Historical Provider, MD  brimonidine (ALPHAGAN) 0.15 % ophthalmic solution Place 1 drop into both eyes 3 (three) times daily.   Yes Historical Provider, MD  cyclobenzaprine (FLEXERIL) 10 MG tablet Take 10 mg by mouth at bedtime.    Yes Historical Provider, MD  gabapentin (NEURONTIN) 600 MG tablet Take 600 mg by mouth 4 (four) times daily as needed (for neuropathy).    Yes Historical  Provider, MD  hydrochlorothiazide (HYDRODIURIL) 25 MG tablet Take 25 mg by mouth daily.   Yes Historical Provider, MD  insulin NPH-regular Human (NOVOLIN 70/30) (70-30) 100 UNIT/ML injection Inject 35 Units into the skin 2 (two) times daily.   Yes Historical Provider, MD  lisinopril (PRINIVIL,ZESTRIL) 40 MG tablet Take 40 mg by mouth daily.   Yes Historical Provider, MD  pantoprazole (PROTONIX) 40 MG tablet Take 1 tablet (40 mg total) by mouth 2 (two) times daily before a meal. Patient taking differently: Take 40 mg by mouth daily.  06/14/15  Yes Mahala Menghini,  PA-C  saxagliptin HCl (ONGLYZA) 5 MG TABS tablet Take 5 mg by mouth daily.   Yes Historical Provider, MD  sucralfate (CARAFATE) 1 g tablet TAKE ONE TABLET BY MOUTH 4 TIMES DAILY Patient taking differently: Take 1 tablet by mouth twice daily 03/12/16  Yes Annitta Needs, NP  timolol (BETIMOL) 0.5 % ophthalmic solution Place 1 drop into both eyes 2 (two) times daily.   Yes Historical Provider, MD    Physical Exam: Vitals:   09/05/16 1732 09/05/16 1800 09/05/16 2001  BP:  (!) 132/105 165/72  Pulse:  105 112  Resp:  22 16  Temp:  100.6 F (38.1 C)   TempSrc:  Oral   SpO2:  98% 96%  Weight: 136.1 kg (300 lb)    Height: 6' (1.829 m)      Constitutional: NAD, calm, comfortable Vitals:   09/05/16 1732 09/05/16 1800 09/05/16 2001  BP:  (!) 132/105 165/72  Pulse:  105 112  Resp:  22 16  Temp:  100.6 F (38.1 C)   TempSrc:  Oral   SpO2:  98% 96%  Weight: 136.1 kg (300 lb)    Height: 6' (1.829 m)     Eyes: PERRL, lids and conjunctivae normal ENMT: Mucous membranes are moist. Posterior pharynx clear of any exudate or lesions.Normal dentition.  Neck: normal, supple, no masses, no thyromegaly Respiratory: clear to auscultation bilaterally, no wheezing, no crackles. Normal respiratory effort. No accessory muscle use.  Cardiovascular: Regular rate and rhythm, no murmurs / rubs / gallops. No extremity edema. 2+ pedal pulses. No carotid bruits.  Abdomen: no tenderness, no masses palpated. No hepatosplenomegaly. Bowel sounds positive.  Musculoskeletal: no clubbing / cyanosis. No joint deformity upper and lower extremities. Good ROM, no contractures. Normal muscle tone.  Skin: no rashes, lesions, ulcers. No induration Neurologic: CN 2-12 grossly intact. Sensation intact, DTR normal. Strength 5/5 in all 4.  Psychiatric: Normal judgment and insight. Alert and oriented x 3. Normal mood.    Labs on Admission: I have personally reviewed following labs and imaging studies  CBC:  Recent Labs Lab  09/05/16 0752 09/05/16 1831  WBC 8.7  --   NEUTROABS 6.8  --   HGB 13.1 12.2*  HCT 37.5* 36.0*  MCV 88.2  --   PLT 147*  --    Basic Metabolic Panel:  Recent Labs Lab 09/05/16 0752 09/05/16 1831  NA 134* 137  K 3.9 5.4*  CL 101 102  CO2 25  --   GLUCOSE 268* 260*  BUN 22* 26*  CREATININE 1.31* 1.20  CALCIUM 9.1  --    GFR: Estimated Creatinine Clearance: 109.9 mL/min (by C-G formula based on SCr of 1.2 mg/dL). Liver Function Tests:  Recent Labs Lab 09/05/16 0752  AST 20  ALT 20  ALKPHOS 74  BILITOT 0.9  PROT 7.1  ALBUMIN 3.5    Recent Labs Lab 09/05/16 0752  LIPASE  20    CBG:  Recent Labs Lab 09/05/16 1732  GLUCAP 237*    Urine analysis:    Component Value Date/Time   COLORURINE YELLOW 09/07/2015 1217   APPEARANCEUR CLEAR 09/07/2015 1217   LABSPEC 1.025 09/07/2015 1217   PHURINE 7.0 09/07/2015 1217   GLUCOSEU NEGATIVE 09/07/2015 1217   HGBUR SMALL (A) 09/07/2015 1217   BILIRUBINUR NEGATIVE 09/07/2015 1217   KETONESUR TRACE (A) 09/07/2015 1217   PROTEINUR 100 (A) 09/07/2015 1217   UROBILINOGEN 0.2 06/07/2015 1353   NITRITE NEGATIVE 09/07/2015 1217   LEUKOCYTESUR NEGATIVE 09/07/2015 1217     Assessment/Plan 46 yo male with intractable nausea, vomiting, recent uri symptoms and orthostatic syncope  Principal Problem:   Orthostatic syncope- got a liter of ivf earlier today, went home and continued to vomit a lot which is nonbloody.  Passed out again at home.  Found to be orthostatic in ED and upon standing almost passed out again due to weakness.  Given another liter of ivf.  Hold bp meds.  ivf overnight. Check orthostatic vitals q shift.  Pt says he is feeling  Much better already with treatment in the ED.  Active Problems:   Gastroparesis due to DM ([place])- prn reglan ordered, abd exam is benign   Dehydration- noted   Intractable vomiting with nausea- pt has fever, and uri symptoms.  Will check quick flu and cxr to r/o underlying infectious  issues     DVT prophylaxis: scds  Code Status:  full Family Communication: none Disposition Plan:  Per day team Consults called:  none Admission status:  observation   Ersel Wadleigh A MD [facility]  If 7PM-7AM, please contact night-coverage [email or web address] Password [email or web address]  09/05/2016, 8:02 PM

## 2016-09-06 DIAGNOSIS — K219 Gastro-esophageal reflux disease without esophagitis: Secondary | ICD-10-CM | POA: Diagnosis present

## 2016-09-06 DIAGNOSIS — J101 Influenza due to other identified influenza virus with other respiratory manifestations: Secondary | ICD-10-CM

## 2016-09-06 DIAGNOSIS — I10 Essential (primary) hypertension: Secondary | ICD-10-CM | POA: Diagnosis present

## 2016-09-06 DIAGNOSIS — K3184 Gastroparesis: Principal | ICD-10-CM

## 2016-09-06 DIAGNOSIS — H409 Unspecified glaucoma: Secondary | ICD-10-CM | POA: Diagnosis present

## 2016-09-06 DIAGNOSIS — R112 Nausea with vomiting, unspecified: Secondary | ICD-10-CM

## 2016-09-06 DIAGNOSIS — Z888 Allergy status to other drugs, medicaments and biological substances status: Secondary | ICD-10-CM | POA: Diagnosis not present

## 2016-09-06 DIAGNOSIS — I951 Orthostatic hypotension: Secondary | ICD-10-CM | POA: Diagnosis present

## 2016-09-06 DIAGNOSIS — R109 Unspecified abdominal pain: Secondary | ICD-10-CM | POA: Diagnosis present

## 2016-09-06 DIAGNOSIS — Z79899 Other long term (current) drug therapy: Secondary | ICD-10-CM | POA: Diagnosis not present

## 2016-09-06 DIAGNOSIS — E1143 Type 2 diabetes mellitus with diabetic autonomic (poly)neuropathy: Secondary | ICD-10-CM | POA: Diagnosis present

## 2016-09-06 DIAGNOSIS — Z880 Allergy status to penicillin: Secondary | ICD-10-CM | POA: Diagnosis not present

## 2016-09-06 DIAGNOSIS — E860 Dehydration: Secondary | ICD-10-CM

## 2016-09-06 DIAGNOSIS — Z833 Family history of diabetes mellitus: Secondary | ICD-10-CM | POA: Diagnosis not present

## 2016-09-06 DIAGNOSIS — Z9842 Cataract extraction status, left eye: Secondary | ICD-10-CM | POA: Diagnosis not present

## 2016-09-06 DIAGNOSIS — Z882 Allergy status to sulfonamides status: Secondary | ICD-10-CM | POA: Diagnosis not present

## 2016-09-06 DIAGNOSIS — N179 Acute kidney failure, unspecified: Secondary | ICD-10-CM | POA: Diagnosis present

## 2016-09-06 DIAGNOSIS — Z961 Presence of intraocular lens: Secondary | ICD-10-CM | POA: Diagnosis present

## 2016-09-06 DIAGNOSIS — Z794 Long term (current) use of insulin: Secondary | ICD-10-CM | POA: Diagnosis not present

## 2016-09-06 DIAGNOSIS — Z9841 Cataract extraction status, right eye: Secondary | ICD-10-CM | POA: Diagnosis not present

## 2016-09-06 DIAGNOSIS — Z881 Allergy status to other antibiotic agents status: Secondary | ICD-10-CM | POA: Diagnosis not present

## 2016-09-06 LAB — CBC
HCT: 35.9 % — ABNORMAL LOW (ref 39.0–52.0)
Hemoglobin: 12.6 g/dL — ABNORMAL LOW (ref 13.0–17.0)
MCH: 31.3 pg (ref 26.0–34.0)
MCHC: 35.1 g/dL (ref 30.0–36.0)
MCV: 89.1 fL (ref 78.0–100.0)
Platelets: 137 10*3/uL — ABNORMAL LOW (ref 150–400)
RBC: 4.03 MIL/uL — ABNORMAL LOW (ref 4.22–5.81)
RDW: 12.4 % (ref 11.5–15.5)
WBC: 9.2 10*3/uL (ref 4.0–10.5)

## 2016-09-06 LAB — GLUCOSE, CAPILLARY
Glucose-Capillary: 246 mg/dL — ABNORMAL HIGH (ref 65–99)
Glucose-Capillary: 217 mg/dL — ABNORMAL HIGH (ref 65–99)
Glucose-Capillary: 261 mg/dL — ABNORMAL HIGH (ref 65–99)
Glucose-Capillary: 172 mg/dL — ABNORMAL HIGH (ref 65–99)

## 2016-09-06 LAB — BASIC METABOLIC PANEL
Anion gap: 11 (ref 5–15)
BUN: 19 mg/dL (ref 6–20)
CO2: 23 mmol/L (ref 22–32)
Calcium: 8.3 mg/dL — ABNORMAL LOW (ref 8.9–10.3)
Chloride: 102 mmol/L (ref 101–111)
Creatinine, Ser: 1.16 mg/dL (ref 0.61–1.24)
GFR calc Af Amer: >60 mL/min (ref >60)
GFR calc non Af Amer: >60 mL/min (ref >60)
Glucose, Bld: 252 mg/dL — ABNORMAL HIGH (ref 65–99)
Potassium: 3.5 mmol/L (ref 3.5–5.1)
Sodium: 136 mmol/L (ref 135–145)

## 2016-09-06 MED ORDER — INSULIN ASPART PROT & ASPART (70-30 MIX) 100 UNIT/ML ~~LOC~~ SUSP
15.0000 [IU] | Freq: Two times a day (BID) | SUBCUTANEOUS | Status: DC
  Administered 2016-09-07: 15 [IU] via SUBCUTANEOUS
  Filled 2016-09-06: qty 10

## 2016-09-06 MED ORDER — DICYCLOMINE HCL 10 MG PO CAPS
10.0000 mg | ORAL_CAPSULE | Freq: Three times a day (TID) | ORAL | Status: DC
  Administered 2016-09-06 – 2016-09-07 (×4): 10 mg via ORAL
  Filled 2016-09-06 (×4): qty 1

## 2016-09-06 MED ORDER — PROCHLORPERAZINE EDISYLATE 5 MG/ML IJ SOLN
10.0000 mg | Freq: Four times a day (QID) | INTRAMUSCULAR | Status: DC | PRN
  Administered 2016-09-06: 10 mg via INTRAVENOUS
  Filled 2016-09-06: qty 2

## 2016-09-06 NOTE — Progress Notes (Signed)
PROGRESS NOTE    Timothy Clark  OMV:[id number] DOB: 08-Sep-1970 DOA: 09/05/2016 PCP: Jeri Modena    Brief Narrative:  46 y/o male with diabetes and gastroparesis, admitted to ED with orthostatic hypotension, dehydration, intractable vomiting and fever. Found to be positive for Influenza A. He is being hydrated and started on tamiflu. Continues to have dry heaving and having difficulty keeping down medications/ food. Continue supportive treatment. Anticipate he should be ready for discharge in AM.   Assessment & Plan:   Principal Problem:   Orthostatic syncope Active Problems:   Intractable vomiting with nausea   Gastroparesis due to DM (HCC)   Dehydration   1. Orthostatic syncope, related to dehydration from vomiting. Improved with IVF. No further symptoms of dizziness  2. Influenza. Patient has tested positive for influenza that is causing his vomiting. He has been started on tamiflu  3. Intractable vomiting. Related to influenza. Continue to treat supportively. Continue antiemetics. Advance diet as tolerated. Discharge home once able to keep medications down.  4. Gastroparesis. On reglan  5. Insulin dependent diabetes. Restart on novolog 70/30. Continue to follow blood sugars    DVT prophylaxis: scd Code Status: full Family Communication: no family present Disposition Plan: discharge home, likely in AM.   Consultants:     Procedures:     Antimicrobials:      Subjective: Overall feeling a little better. Still having dry heaves  Objective: Vitals:   09/05/16 2027 09/05/16 2133 09/05/16 2230 09/06/16 0545  BP: (!) 163/84   (!) 173/96  Pulse: (!) 111   89  Resp: 17   16  Temp:  (!) 101.9 F (38.8 C) 98 F (36.7 C) 99 F (37.2 C)  TempSrc:  Oral Oral Oral  SpO2: 99%   98%  Weight:  136.1 kg (300 lb)    Height:  6' (1.829 m)      Intake/Output Summary (Last 24 hours) at 09/06/16 1809 Last data filed at 09/06/16 1500  Gross per 24 hour    Intake             1975 ml  Output                0 ml  Net             1975 ml   Filed Weights   09/05/16 1732 09/05/16 2133  Weight: 136.1 kg (300 lb) 136.1 kg (300 lb)    Examination:  General exam: Appears calm and comfortable  Respiratory system: Clear to auscultation. Respiratory effort normal. Cardiovascular system: S1 & S2 heard, RRR. No JVD, murmurs, rubs, gallops or clicks. No pedal edema. Gastrointestinal system: Abdomen is nondistended, soft and diffusely tender. No organomegaly or masses felt. Normal bowel sounds heard. Central nervous system: Alert and oriented. No focal neurological deficits. Extremities: Symmetric 5 x 5 power. Skin: No rashes, lesions or ulcers Psychiatry: Judgement and insight appear normal. Mood & affect appropriate.     Data Reviewed: I have personally reviewed following labs and imaging studies  CBC:  Recent Labs Lab 09/05/16 0752 09/05/16 1831 09/06/16 0637  WBC 8.7  --  9.2  NEUTROABS 6.8  --   --   HGB 13.1 12.2* 12.6*  HCT 37.5* 36.0* 35.9*  MCV 88.2  --  89.1  PLT 147*  --  628*   Basic Metabolic Panel:  Recent Labs Lab 09/05/16 0752 09/05/16 1831 09/06/16 0637  NA 134* 137 136  K 3.9 5.4* 3.5  CL 101 102 102  CO2 25  --  23  GLUCOSE 268* 260* 252*  BUN 22* 26* 19  CREATININE 1.31* 1.20 1.16  CALCIUM 9.1  --  8.3*   GFR: Estimated Creatinine Clearance: 113.7 mL/min (by C-G formula based on SCr of 1.16 mg/dL). Liver Function Tests:  Recent Labs Lab 09/05/16 0752  AST 20  ALT 20  ALKPHOS 74  BILITOT 0.9  PROT 7.1  ALBUMIN 3.5    Recent Labs Lab 09/05/16 0752  LIPASE 20   No results for input(s): AMMONIA in the last 168 hours. Coagulation Profile: No results for input(s): INR, PROTIME in the last 168 hours. Cardiac Enzymes: No results for input(s): CKTOTAL, CKMB, CKMBINDEX, TROPONINI in the last 168 hours. BNP (last 3 results) No results for input(s): PROBNP in the last 8760 hours. HbA1C: No  results for input(s): HGBA1C in the last 72 hours. CBG:  Recent Labs Lab 09/05/16 1732 09/06/16 0803 09/06/16 1156 09/06/16 1636  GLUCAP 237* 246* 217* 261*   Lipid Profile: No results for input(s): CHOL, HDL, LDLCALC, TRIG, CHOLHDL, LDLDIRECT in the last 72 hours. Thyroid Function Tests: No results for input(s): TSH, T4TOTAL, FREET4, T3FREE, THYROIDAB in the last 72 hours. Anemia Panel: No results for input(s): VITAMINB12, FOLATE, FERRITIN, TIBC, IRON, RETICCTPCT in the last 72 hours. Sepsis Labs: No results for input(s): PROCALCITON, LATICACIDVEN in the last 168 hours.  No results found for this or any previous visit (from the past 240 hour(s)).       Radiology Studies: Dg Chest Port 1 View  Result Date: 09/05/2016 CLINICAL DATA:  46 year old male with fever and nausea and vomiting. EXAM: PORTABLE CHEST 1 VIEW COMPARISON:  Chest radiograph dated 03/18/2015 FINDINGS: There is shallow inspiration with minimal bibasilar atelectatic changes. There is no focal consolidation, pleural effusion, or pneumothorax. The cardiac silhouette is within normal limits. No acute osseous pathology. IMPRESSION: No active disease. Electronically Signed   By: Anner Crete M.D.   On: 09/05/2016 23:52        Scheduled Meds: . atorvastatin  10 mg Oral q1800  . brimonidine  1 drop Both Eyes TID  . cyclobenzaprine  10 mg Oral QHS  . dicyclomine  10 mg Oral TID AC  . insulin aspart  0-9 Units Subcutaneous TID WC  . [START ON 09/07/2016] insulin aspart protamine- aspart  15 Units Subcutaneous BID WC  . metoCLOPramide (REGLAN) injection  10 mg Intravenous Q8H  . oseltamivir  75 mg Oral BID  . pantoprazole  40 mg Oral Daily  . sucralfate  1 g Oral BID  . timolol  1 drop Both Eyes BID   Continuous Infusions: . sodium chloride 125 mL/hr at 09/06/16 0843     LOS: 0 days    Time spent: 35mins    MEMON,JEHANZEB, MD [facility] Pager [phone]  If 7PM-7AM, please contact  night-coverage [email or web address] Password [email or web address] 09/06/2016, 6:09 PM

## 2016-09-07 DIAGNOSIS — N179 Acute kidney failure, unspecified: Secondary | ICD-10-CM | POA: Diagnosis present

## 2016-09-07 DIAGNOSIS — E119 Type 2 diabetes mellitus without complications: Secondary | ICD-10-CM

## 2016-09-07 DIAGNOSIS — IMO0001 Reserved for inherently not codable concepts without codable children: Secondary | ICD-10-CM

## 2016-09-07 DIAGNOSIS — Z794 Long term (current) use of insulin: Secondary | ICD-10-CM

## 2016-09-07 DIAGNOSIS — I10 Essential (primary) hypertension: Secondary | ICD-10-CM

## 2016-09-07 LAB — GLUCOSE, CAPILLARY
Glucose-Capillary: 206 mg/dL — ABNORMAL HIGH (ref 65–99)
Glucose-Capillary: 182 mg/dL — ABNORMAL HIGH (ref 65–99)

## 2016-09-07 MED ORDER — DICYCLOMINE HCL 10 MG PO CAPS: 10.0000 mg | ORAL_CAPSULE | Freq: Three times a day (TID) | ORAL | 0 refills | Status: DC | PRN

## 2016-09-07 MED ORDER — PROCHLORPERAZINE MALEATE 10 MG PO TABS: 10.0000 mg | ORAL_TABLET | Freq: Four times a day (QID) | ORAL | 0 refills | Status: DC | PRN

## 2016-09-07 MED ORDER — OSELTAMIVIR PHOSPHATE 75 MG PO CAPS: 75.0000 mg | ORAL_CAPSULE | Freq: Two times a day (BID) | ORAL | 0 refills | Status: DC

## 2016-09-07 NOTE — Discharge Summary (Signed)
Physician Discharge Summary  Timothy Clark:[id number] DOB: 03/31/1971 DOA: 09/05/2016  PCP: Jeri Modena  Admit date: 09/05/2016 Discharge date: 09/07/2016  Admitted From: home Disposition:  home  Recommendations for Outpatient Follow-up:  1. Follow up with PCP in 1-2 weeks 2. Please obtain BMP/CBC in one week   Home Health: Equipment/Devices:  Discharge Condition: stable CODE STATUS: full Diet recommendation: Heart Healthy / Carb Modified   Brief/Interim Summary: 46 y/o male with diabetes and gastroparesis, admitted to ED with orthostatic hypotension, dehydration, intractable vomiting and fever. Found to be positive for Influenza A.Patient was treated with Tamiflu. He was started on intravenous hydration and orthostasis has since resolved. Due to his dehydration, he did have mild AK I . hydrochlorothiazide and lisinopril were held on admission. His creatinine was initially 1.3, but has since improved to 1.1. Patient treated supportively for nausea and vomiting. His symptoms have since improved and is able to tolerate solid diet. He's not having any further vomiting. He is adequately hydrated. The patient is otherwise stable for discharge home. He will be discharged on Tamiflu and has been advised to keep himself well-hydrated.  Discharge Diagnoses:  Principal Problem:   Orthostatic syncope Active Problems:   Intractable vomiting with nausea   Essential hypertension   Gastroparesis due to DM (HCC)   Dehydration   AKI (acute kidney injury) ([place])   Insulin dependent diabetes mellitus [facility])    Discharge Instructions  Discharge Instructions    Diet - low sodium heart healthy    Complete by:  As directed    Increase activity slowly    Complete by:  As directed      Allergies as of 09/07/2016      Reactions   Donnatal [pb-hyoscy-atropine-scopol Er] Anaphylaxis   Reaction to GI cocktail   Fish Allergy Anaphylaxis, Hives, Rash   Haldol [haloperidol Lactate] Other  (See Comments)   Chest Pain   Lidocaine Anaphylaxis   Reaction to GI cocktail   Maalox [calcium Carbonate Antacid] Anaphylaxis   Reaction to GI cocktail   Aspirin Hives, Itching   Bactrim [sulfamethoxazole-trimethoprim] Itching   Doxycycline Hives, Itching   Penicillins Hives, Itching   Has patient had a PCN reaction causing immediate rash, facial/tongue/throat swelling, SOB or lightheadedness with hypotension: yes Has patient had a PCN reaction causing severe rash involving mucus membranes or skin necrosis: yes Has patient had a PCN reaction that required hospitalization no Has patient had a PCN reaction occurring within the last 10 years: yes If all of the above answers are "NO", then may proceed with Cephalospor   Phenergan [promethazine Hcl] Nausea And Vomiting   Zofran [ondansetron Hcl] Other (See Comments)   Hiccups      Medication List    TAKE these medications   amLODipine 5 MG tablet Commonly known as:  NORVASC Take 5 mg by mouth daily.   atorvastatin 10 MG tablet Commonly known as:  LIPITOR Take 10 mg by mouth daily.   brimonidine 0.15 % ophthalmic solution Commonly known as:  ALPHAGAN Place 1 drop into both eyes 3 (three) times daily.   cyclobenzaprine 10 MG tablet Commonly known as:  FLEXERIL Take 10 mg by mouth at bedtime.   dicyclomine 10 MG capsule Commonly known as:  BENTYL Take 1 capsule (10 mg total) by mouth 3 (three) times daily as needed for spasms.   gabapentin 600 MG tablet Commonly known as:  NEURONTIN Take 600 mg by mouth 4 (four) times daily as needed (for neuropathy).   hydrochlorothiazide  25 MG tablet Commonly known as:  HYDRODIURIL Take 25 mg by mouth daily.   insulin NPH-regular Human (70-30) 100 UNIT/ML injection Commonly known as:  NOVOLIN 70/30 Inject 35 Units into the skin 2 (two) times daily.   lisinopril 40 MG tablet Commonly known as:  PRINIVIL,ZESTRIL Take 40 mg by mouth daily.   ONGLYZA 5 MG Tabs tablet Generic drug:   saxagliptin HCl Take 5 mg by mouth daily.   oseltamivir 75 MG capsule Commonly known as:  TAMIFLU Take 1 capsule (75 mg total) by mouth 2 (two) times daily.   pantoprazole 40 MG tablet Commonly known as:  PROTONIX Take 1 tablet (40 mg total) by mouth 2 (two) times daily before a meal. What changed:  when to take this   prochlorperazine 10 MG tablet Commonly known as:  COMPAZINE Take 1 tablet (10 mg total) by mouth every 6 (six) hours as needed for nausea or vomiting.   sucralfate 1 g tablet Commonly known as:  CARAFATE TAKE ONE TABLET BY MOUTH 4 TIMES DAILY What changed:  See the new instructions.   timolol 0.5 % ophthalmic solution Commonly known as:  BETIMOL Place 1 drop into both eyes 2 (two) times daily.       Allergies  Allergen Reactions  . Donnatal [Pb-Hyoscy-Atropine-Scopol Er] Anaphylaxis    Reaction to GI cocktail  . Fish Allergy Anaphylaxis, Hives and Rash  . Haldol [Haloperidol Lactate] Other (See Comments)    Chest Pain  . Lidocaine Anaphylaxis    Reaction to GI cocktail  . Maalox [Calcium Carbonate Antacid] Anaphylaxis    Reaction to GI cocktail  . Aspirin Hives and Itching  . Bactrim [Sulfamethoxazole-Trimethoprim] Itching  . Doxycycline Hives and Itching  . Penicillins Hives and Itching      . Phenergan [Promethazine Hcl] Nausea And Vomiting  . Zofran [Ondansetron Hcl] Other (See Comments)    Hiccups     Consultations:     Procedures/Studies: Dg Chest Port 1 View  Result Date: 09/05/2016 CLINICAL DATA:  46 year old male with fever and nausea and vomiting. EXAM: PORTABLE CHEST 1 VIEW COMPARISON:  Chest radiograph dated 03/18/2015 FINDINGS: There is shallow inspiration with minimal bibasilar atelectatic changes. There is no focal consolidation, pleural effusion, or pneumothorax. The cardiac silhouette is within normal limits. No acute osseous pathology. IMPRESSION: No active disease. Electronically Signed   By: Anner Crete M.D.   On:  09/05/2016 23:52      Subjective: No vomiting, abdominal pain is better  Discharge Exam: Vitals:   09/06/16 2254 09/07/16 0500  BP: (!) 161/80 (!) 175/91  Pulse: 83 87  Resp: 16 16  Temp: 100.1 F (37.8 C) 99.4 F (37.4 C)   Vitals:   09/05/16 2230 09/06/16 0545 09/06/16 2254 09/07/16 0500  BP:  (!) 173/96 (!) 161/80 (!) 175/91  Pulse:  89 83 87  Resp:  16 16 16   Temp: 98 F (36.7 C) 99 F (37.2 C) 100.1 F (37.8 C) 99.4 F (37.4 C)  TempSrc: Oral Oral Oral Oral  SpO2:  98% 98% 100%  Weight:      Height:        General: Pt is alert, awake, not in acute distress Cardiovascular: RRR, S1/S2 +, no rubs, no gallops Respiratory: CTA bilaterally, no wheezing, no rhonchi Abdominal: Soft, NT, ND, bowel sounds + Extremities: no edema, no cyanosis    The results of significant diagnostics from this hospitalization (including imaging, microbiology, ancillary and laboratory) are listed below for reference.  Microbiology: No results found for this or any previous visit (from the past 240 hour(s)).   Labs: BNP (last 3 results) No results for input(s): BNP in the last 8760 hours. Basic Metabolic Panel:  Recent Labs Lab 09/05/16 0752 09/05/16 1831 09/06/16 0637  NA 134* 137 136  K 3.9 5.4* 3.5  CL 101 102 102  CO2 25  --  23  GLUCOSE 268* 260* 252*  BUN 22* 26* 19  CREATININE 1.31* 1.20 1.16  CALCIUM 9.1  --  8.3*   Liver Function Tests:  Recent Labs Lab 09/05/16 0752  AST 20  ALT 20  ALKPHOS 74  BILITOT 0.9  PROT 7.1  ALBUMIN 3.5    Recent Labs Lab 09/05/16 0752  LIPASE 20   No results for input(s): AMMONIA in the last 168 hours. CBC:  Recent Labs Lab 09/05/16 0752 09/05/16 1831 09/06/16 0637  WBC 8.7  --  9.2  NEUTROABS 6.8  --   --   HGB 13.1 12.2* 12.6*  HCT 37.5* 36.0* 35.9*  MCV 88.2  --  89.1  PLT 147*  --  137*   Cardiac Enzymes: No results for input(s): CKTOTAL, CKMB, CKMBINDEX, TROPONINI in the last 168  hours. BNP: Invalid input(s): POCBNP CBG:  Recent Labs Lab 09/06/16 1156 09/06/16 1636 09/06/16 2206 09/07/16 0719 09/07/16 1111  GLUCAP 217* 261* 172* 206* 182*   D-Dimer No results for input(s): DDIMER in the last 72 hours. Hgb A1c No results for input(s): HGBA1C in the last 72 hours. Lipid Profile No results for input(s): CHOL, HDL, LDLCALC, TRIG, CHOLHDL, LDLDIRECT in the last 72 hours. Thyroid function studies No results for input(s): TSH, T4TOTAL, T3FREE, THYROIDAB in the last 72 hours.  Invalid input(s): FREET3 Anemia work up No results for input(s): VITAMINB12, FOLATE, FERRITIN, TIBC, IRON, RETICCTPCT in the last 72 hours. Urinalysis    Component Value Date/Time   COLORURINE YELLOW 09/07/2015 [place] 09/07/2015 1217   LABSPEC 1.025 09/07/2015 1217   PHURINE 7.0 09/07/2015 1217   GLUCOSEU NEGATIVE 09/07/2015 1217   HGBUR SMALL (A) 09/07/2015 1217   BILIRUBINUR NEGATIVE 09/07/2015 1217   KETONESUR TRACE (A) 09/07/2015 1217   PROTEINUR 100 (A) 09/07/2015 1217   UROBILINOGEN 0.2 06/07/2015 1353   NITRITE NEGATIVE 09/07/2015 1217   LEUKOCYTESUR NEGATIVE 09/07/2015 1217   Sepsis Labs Invalid input(s): PROCALCITONIN,  WBC,  LACTICIDVEN Microbiology No results found for this or any previous visit (from the past 240 hour(s)).   Time coordinating discharge: Over 30 minutes  SIGNED:   Kathie Dike, MD  [facility] 09/07/2016, 12:28 PM Pager   If 7PM-7AM, please contact night-coverage [email or web address] Password [email or web address]

## 2016-09-08 ENCOUNTER — Encounter: Payer: Self-pay | Admitting: Nurse Practitioner

## 2016-09-13 ENCOUNTER — Emergency Department (HOSPITAL_COMMUNITY): Payer: Medicare Other

## 2016-09-13 ENCOUNTER — Inpatient Hospital Stay (HOSPITAL_COMMUNITY)
Admission: EM | Admit: 2016-09-13 | Discharge: 2016-09-15 | DRG: 074 | Disposition: A | Discharge status: Home / Self Care | Payer: Medicare Other | Attending: Internal Medicine | Admitting: Internal Medicine

## 2016-09-13 ENCOUNTER — Encounter (HOSPITAL_COMMUNITY): Payer: Self-pay | Admitting: Emergency Medicine

## 2016-09-13 DIAGNOSIS — I10 Essential (primary) hypertension: Secondary | ICD-10-CM | POA: Diagnosis not present

## 2016-09-13 DIAGNOSIS — E118 Type 2 diabetes mellitus with unspecified complications: Secondary | ICD-10-CM | POA: Diagnosis not present

## 2016-09-13 DIAGNOSIS — Z794 Long term (current) use of insulin: Secondary | ICD-10-CM

## 2016-09-13 DIAGNOSIS — Z8049 Family history of malignant neoplasm of other genital organs: Secondary | ICD-10-CM

## 2016-09-13 DIAGNOSIS — N186 End stage renal disease: Secondary | ICD-10-CM | POA: Diagnosis present

## 2016-09-13 DIAGNOSIS — R112 Nausea with vomiting, unspecified: Secondary | ICD-10-CM | POA: Diagnosis not present

## 2016-09-13 DIAGNOSIS — E1165 Type 2 diabetes mellitus with hyperglycemia: Secondary | ICD-10-CM | POA: Diagnosis not present

## 2016-09-13 DIAGNOSIS — E876 Hypokalemia: Secondary | ICD-10-CM | POA: Diagnosis present

## 2016-09-13 DIAGNOSIS — H409 Unspecified glaucoma: Secondary | ICD-10-CM | POA: Diagnosis present

## 2016-09-13 DIAGNOSIS — Z91013 Allergy to seafood: Secondary | ICD-10-CM

## 2016-09-13 DIAGNOSIS — E1143 Type 2 diabetes mellitus with diabetic autonomic (poly)neuropathy: Secondary | ICD-10-CM | POA: Diagnosis not present

## 2016-09-13 DIAGNOSIS — K3184 Gastroparesis: Secondary | ICD-10-CM | POA: Diagnosis not present

## 2016-09-13 DIAGNOSIS — Z880 Allergy status to penicillin: Secondary | ICD-10-CM

## 2016-09-13 DIAGNOSIS — G43A1 Cyclical vomiting, intractable: Secondary | ICD-10-CM | POA: Diagnosis not present

## 2016-09-13 DIAGNOSIS — R110 Nausea: Secondary | ICD-10-CM | POA: Diagnosis not present

## 2016-09-13 DIAGNOSIS — K219 Gastro-esophageal reflux disease without esophagitis: Secondary | ICD-10-CM | POA: Diagnosis present

## 2016-09-13 DIAGNOSIS — E1122 Type 2 diabetes mellitus with diabetic chronic kidney disease: Secondary | ICD-10-CM | POA: Diagnosis present

## 2016-09-13 DIAGNOSIS — J101 Influenza due to other identified influenza virus with other respiratory manifestations: Secondary | ICD-10-CM | POA: Diagnosis present

## 2016-09-13 DIAGNOSIS — Z833 Family history of diabetes mellitus: Secondary | ICD-10-CM

## 2016-09-13 DIAGNOSIS — Z886 Allergy status to analgesic agent status: Secondary | ICD-10-CM

## 2016-09-13 DIAGNOSIS — Z8041 Family history of malignant neoplasm of ovary: Secondary | ICD-10-CM

## 2016-09-13 DIAGNOSIS — Z888 Allergy status to other drugs, medicaments and biological substances status: Secondary | ICD-10-CM

## 2016-09-13 DIAGNOSIS — R1115 Cyclical vomiting syndrome unrelated to migraine: Secondary | ICD-10-CM

## 2016-09-13 DIAGNOSIS — E785 Hyperlipidemia, unspecified: Secondary | ICD-10-CM | POA: Diagnosis present

## 2016-09-13 LAB — URINALYSIS, ROUTINE W REFLEX MICROSCOPIC
Bilirubin Urine: NEGATIVE
Glucose, UA: >=500 mg/dL — AB
Ketones, ur: 5 mg/dL — AB
Leukocytes, UA: NEGATIVE
Nitrite: NEGATIVE
Protein, ur: >=300 mg/dL — AB
Specific Gravity, Urine: 1.015 (ref 1.005–1.030)
pH: 7 (ref 5.0–8.0)

## 2016-09-13 LAB — COMPREHENSIVE METABOLIC PANEL
ALT: 21 U/L (ref 17–63)
AST: 23 U/L (ref 15–41)
Albumin: 3.6 g/dL (ref 3.5–5.0)
Alkaline Phosphatase: 65 U/L (ref 38–126)
Anion gap: 10 (ref 5–15)
BUN: 15 mg/dL (ref 6–20)
CO2: 29 mmol/L (ref 22–32)
Calcium: 9.5 mg/dL (ref 8.9–10.3)
Chloride: 100 mmol/L — ABNORMAL LOW (ref 101–111)
Creatinine, Ser: 1.23 mg/dL (ref 0.61–1.24)
GFR calc Af Amer: >60 mL/min (ref >60)
GFR calc non Af Amer: >60 mL/min (ref >60)
Glucose, Bld: 311 mg/dL — ABNORMAL HIGH (ref 65–99)
Potassium: 3.7 mmol/L (ref 3.5–5.1)
Sodium: 139 mmol/L (ref 135–145)
Total Bilirubin: 1 mg/dL (ref 0.3–1.2)
Total Protein: 7.7 g/dL (ref 6.5–8.1)

## 2016-09-13 LAB — LIPASE, BLOOD: Lipase: 25 U/L (ref 11–51)

## 2016-09-13 LAB — CBC WITH DIFFERENTIAL/PLATELET
Basophils Absolute: 0 10*3/uL (ref 0.0–0.1)
Basophils Relative: 0 %
Eosinophils Absolute: 0.1 10*3/uL (ref 0.0–0.7)
Eosinophils Relative: 1 %
HCT: 36.4 % — ABNORMAL LOW (ref 39.0–52.0)
Hemoglobin: 13.1 g/dL (ref 13.0–17.0)
Lymphocytes Relative: 14 %
Lymphs Abs: 1.5 10*3/uL (ref 0.7–4.0)
MCH: 31 pg (ref 26.0–34.0)
MCHC: 36 g/dL (ref 30.0–36.0)
MCV: 86.1 fL (ref 78.0–100.0)
Monocytes Absolute: 0.5 10*3/uL (ref 0.1–1.0)
Monocytes Relative: 5 %
Neutro Abs: 8.7 10*3/uL — ABNORMAL HIGH (ref 1.7–7.7)
Neutrophils Relative %: 80 %
Platelets: 179 10*3/uL (ref 150–400)
RBC: 4.23 MIL/uL (ref 4.22–5.81)
RDW: 12.1 % (ref 11.5–15.5)
WBC: 10.8 10*3/uL — ABNORMAL HIGH (ref 4.0–10.5)

## 2016-09-13 LAB — TROPONIN I: Troponin I: <0.03 ng/mL (ref <0.03)

## 2016-09-13 LAB — CBG MONITORING, ED: Glucose-Capillary: 277 mg/dL — ABNORMAL HIGH (ref 65–99)

## 2016-09-13 LAB — GLUCOSE, CAPILLARY: Glucose-Capillary: 247 mg/dL — ABNORMAL HIGH (ref 65–99)

## 2016-09-13 MED ORDER — POTASSIUM CHLORIDE IN NACL 20-0.9 MEQ/L-% IV SOLN
INTRAVENOUS | Status: DC
  Administered 2016-09-13 – 2016-09-15 (×5): via INTRAVENOUS

## 2016-09-13 MED ORDER — ENOXAPARIN SODIUM 80 MG/0.8ML ~~LOC~~ SOLN
70.0000 mg | SUBCUTANEOUS | Status: DC
  Administered 2016-09-13: 70 mg via SUBCUTANEOUS
  Filled 2016-09-13: qty 0.8

## 2016-09-13 MED ORDER — LORAZEPAM 2 MG/ML IJ SOLN
1.0000 mg | Freq: Once | INTRAMUSCULAR | Status: AC
  Administered 2016-09-13: 1 mg via INTRAVENOUS
  Filled 2016-09-13: qty 1

## 2016-09-13 MED ORDER — INSULIN ASPART 100 UNIT/ML ~~LOC~~ SOLN
0.0000 [IU] | Freq: Every day | SUBCUTANEOUS | Status: DC
  Administered 2016-09-13: 2 [IU] via SUBCUTANEOUS

## 2016-09-13 MED ORDER — DIPHENHYDRAMINE HCL 50 MG/ML IJ SOLN
25.0000 mg | Freq: Once | INTRAMUSCULAR | Status: AC
  Administered 2016-09-13: 25 mg via INTRAVENOUS
  Filled 2016-09-13: qty 1

## 2016-09-13 MED ORDER — SODIUM CHLORIDE 0.9 % IV SOLN: INTRAVENOUS | Status: AC

## 2016-09-13 MED ORDER — OSELTAMIVIR PHOSPHATE 75 MG PO CAPS
75.0000 mg | ORAL_CAPSULE | Freq: Two times a day (BID) | ORAL | Status: DC
  Administered 2016-09-13 – 2016-09-15 (×4): 75 mg via ORAL
  Filled 2016-09-13 (×4): qty 1

## 2016-09-13 MED ORDER — INSULIN ASPART 100 UNIT/ML ~~LOC~~ SOLN
0.0000 [IU] | Freq: Three times a day (TID) | SUBCUTANEOUS | Status: DC
  Administered 2016-09-14: 4 [IU] via SUBCUTANEOUS
  Administered 2016-09-14: 3 [IU] via SUBCUTANEOUS
  Administered 2016-09-14: 4 [IU] via SUBCUTANEOUS

## 2016-09-13 MED ORDER — PROCHLORPERAZINE MALEATE 5 MG PO TABS: 10.0000 mg | ORAL_TABLET | Freq: Four times a day (QID) | ORAL | Status: DC | PRN

## 2016-09-13 MED ORDER — FAMOTIDINE IN NACL 20-0.9 MG/50ML-% IV SOLN
20.0000 mg | Freq: Once | INTRAVENOUS | Status: AC
  Administered 2016-09-13: 20 mg via INTRAVENOUS
  Filled 2016-09-13: qty 50

## 2016-09-13 MED ORDER — HYDROCHLOROTHIAZIDE 25 MG PO TABS
25.0000 mg | ORAL_TABLET | Freq: Every day | ORAL | Status: DC
  Administered 2016-09-14 – 2016-09-15 (×2): 25 mg via ORAL
  Filled 2016-09-13 (×2): qty 1

## 2016-09-13 MED ORDER — POLYETHYLENE GLYCOL 3350 17 G PO PACK: 17.0000 g | PACK | Freq: Every day | ORAL | Status: DC | PRN

## 2016-09-13 MED ORDER — ACETAMINOPHEN 650 MG RE SUPP
650.0000 mg | Freq: Four times a day (QID) | RECTAL | Status: DC | PRN
Start: 2016-09-13 — End: 2016-09-15

## 2016-09-13 MED ORDER — LINAGLIPTIN 5 MG PO TABS
5.0000 mg | ORAL_TABLET | Freq: Every day | ORAL | Status: DC
  Administered 2016-09-14 – 2016-09-15 (×2): 5 mg via ORAL
  Filled 2016-09-13 (×2): qty 1

## 2016-09-13 MED ORDER — METOCLOPRAMIDE HCL 5 MG/ML IJ SOLN
10.0000 mg | Freq: Four times a day (QID) | INTRAMUSCULAR | Status: DC
  Administered 2016-09-13 – 2016-09-15 (×6): 10 mg via INTRAVENOUS
  Filled 2016-09-13 (×6): qty 2

## 2016-09-13 MED ORDER — SUCRALFATE 1 G PO TABS
1.0000 g | ORAL_TABLET | Freq: Two times a day (BID) | ORAL | Status: DC
  Administered 2016-09-13 – 2016-09-15 (×4): 1 g via ORAL
  Filled 2016-09-13 (×4): qty 1

## 2016-09-13 MED ORDER — GABAPENTIN 600 MG PO TABS
600.0000 mg | ORAL_TABLET | Freq: Four times a day (QID) | ORAL | Status: DC | PRN
  Filled 2016-09-13: qty 1

## 2016-09-13 MED ORDER — PANTOPRAZOLE SODIUM 40 MG PO TBEC
40.0000 mg | DELAYED_RELEASE_TABLET | Freq: Two times a day (BID) | ORAL | Status: DC
  Administered 2016-09-13 – 2016-09-15 (×4): 40 mg via ORAL
  Filled 2016-09-13 (×4): qty 1

## 2016-09-13 MED ORDER — METOCLOPRAMIDE HCL 5 MG/ML IJ SOLN
10.0000 mg | Freq: Once | INTRAMUSCULAR | Status: AC
  Administered 2016-09-13: 10 mg via INTRAVENOUS
  Filled 2016-09-13: qty 2

## 2016-09-13 MED ORDER — LISINOPRIL 10 MG PO TABS
40.0000 mg | ORAL_TABLET | Freq: Every day | ORAL | Status: DC
  Administered 2016-09-14 – 2016-09-15 (×2): 40 mg via ORAL
  Filled 2016-09-13 (×2): qty 4

## 2016-09-13 MED ORDER — DICYCLOMINE HCL 10 MG PO CAPS
10.0000 mg | ORAL_CAPSULE | Freq: Three times a day (TID) | ORAL | Status: DC | PRN
  Administered 2016-09-14: 10 mg via ORAL
  Filled 2016-09-13: qty 1

## 2016-09-13 MED ORDER — ACETAMINOPHEN 325 MG PO TABS
650.0000 mg | ORAL_TABLET | Freq: Four times a day (QID) | ORAL | Status: DC | PRN
  Administered 2016-09-14: 650 mg via ORAL
  Filled 2016-09-13: qty 2

## 2016-09-13 MED ORDER — SODIUM CHLORIDE 0.9 % IV BOLUS (SEPSIS)
1000.0000 mL | Freq: Once | INTRAVENOUS | Status: AC
  Administered 2016-09-13: 1000 mL via INTRAVENOUS

## 2016-09-13 MED ORDER — CYCLOBENZAPRINE HCL 10 MG PO TABS
10.0000 mg | ORAL_TABLET | Freq: Every day | ORAL | Status: DC
  Administered 2016-09-13 – 2016-09-14 (×2): 10 mg via ORAL
  Filled 2016-09-13 (×2): qty 1

## 2016-09-13 MED ORDER — INSULIN GLARGINE 100 UNIT/ML ~~LOC~~ SOLN
50.0000 [IU] | Freq: Every day | SUBCUTANEOUS | Status: DC
  Administered 2016-09-13 – 2016-09-14 (×2): 50 [IU] via SUBCUTANEOUS
  Filled 2016-09-13 (×3): qty 0.5

## 2016-09-13 MED ORDER — AMLODIPINE BESYLATE 5 MG PO TABS
5.0000 mg | ORAL_TABLET | Freq: Every day | ORAL | Status: DC
  Administered 2016-09-14 – 2016-09-15 (×2): 5 mg via ORAL
  Filled 2016-09-13 (×2): qty 1

## 2016-09-13 MED ORDER — ATORVASTATIN CALCIUM 10 MG PO TABS
10.0000 mg | ORAL_TABLET | Freq: Every day | ORAL | Status: DC
Start: 2016-09-14 — End: 2016-09-15
  Administered 2016-09-14 – 2016-09-15 (×2): 10 mg via ORAL
  Filled 2016-09-13 (×2): qty 1

## 2016-09-13 NOTE — ED Triage Notes (Addendum)
Pt reports emesis since this morning with cough, abdominal pain, and chest pain.  Pt alert and oriented at this time. Pt also had left arm numbness starting after getting out of the car.   Pt was helped by security into wheelchair in the waiting room and then started to slide out of wheelchair, but never hit the floor.  Pt was placed on a stretcher in the waiting room.  Pt is a high fall risk, christy, primary rn notified.

## 2016-09-13 NOTE — Progress Notes (Signed)
Pharmacy clarification  Adjusted lovenox dose to 0.5 mg/kg q24h for DVT pxl in patient with BMI >40.  Dose is 70 mg sq q24h.  [place] PharmD BCPS 09/13/2016 8:00 PM

## 2016-09-13 NOTE — H&P (Signed)
History and Physical  JESSICA SEIDMAN KGY:[id number] DOB: 05/18/1971 DOA: 09/13/2016  Referring physician: Dr Thurnell Garbe, ED physician PCP: Jeri Modena  Outpatient Specialists:   Dr Gala Romney (GI)  Patient Coming From: Home  Chief Complaint: abdominal pain, vomiting  HPI: THELTON GRACA is a 46 y.o. male with a history of DM2 on insulin, gastroparesis, GERD, HTN, glaucoma. Patient was admitted for Flu and gastroparesis from 1/26-1/28. Patient was discharged to home on tamiflu and tolerating an oral diet. Yesterday, patient had a salad and started to have vomiting. Vomited multiple times throughout night - had little sleep. Describes emesis as stomach contents. Regular antiemetics not helpful. Oral food and liquid makes symptoms worse. Has upper abdominal pain, no radiation.  Emergency Department Course: Xray and labs normal. Pt received IV reglan and benadryl. Failed PO challenge  Review of Systems:   Pt denies any fevers, chills, diarrhea, constipation, abdominal pain, shortness of breath, dyspnea on exertion, orthopnea, cough, wheezing, palpitations, headache, vision changes, lightheadedness, dizziness, melena, rectal bleeding.  Review of systems are otherwise negative  Past Medical History:  Diagnosis Date  . Chronic abdominal pain   . Esophagitis   . Eye hemorrhage   . Gastritis   . Gastroparesis   . GERD (gastroesophageal reflux disease)   . Glaucoma   . Helicobacter pylori (H. pylori) infection   . Hiccups   . Hypertension   . Nausea and vomiting    chronic, recurrent  . Type 2 diabetes mellitus with complications (HCC)    diagnosed around age 70   Past Surgical History:  Procedure Laterality Date  . CATARACT EXTRACTION W/PHACO Left 05/19/2016   Procedure: CATARACT EXTRACTION PHACO AND INTRAOCULAR LENS PLACEMENT LEFT EYE;  Surgeon: Tonny Branch, MD;  Location: [facility];  Service: Ophthalmology;  Laterality: Left;  CDE: 7.30  . CATARACT EXTRACTION W/PHACO Right  06/23/2016   Procedure: CATARACT EXTRACTION PHACO AND INTRAOCULAR LENS PLACEMENT RIGHT EYE CDE=9.87;  Surgeon: Tonny Branch, MD;  Location: [facility];  Service: Ophthalmology;  Laterality: Right;  right  . ESOPHAGOGASTRODUODENOSCOPY  2015   Dr. Britta Mccreedy  . ESOPHAGOGASTRODUODENOSCOPY N/A 10/26/2014   RMR: Distal esophagititis-likely reflux related although an element of pill induced injuruy no exclueded  status post biopsy. Diffusely abnormal gastric mucosa of uncertain signigicane -status post gastric biopsy. Focal area of excoriation in the cardia most consistant with a trauma of heaving.   [name] EYE SURGERY  2015   stent placed to left eye  . EYE SURGERY     Social History:  reports that he has never smoked. He has never used smokeless tobacco. He reports that he does not drink alcohol or use drugs. Patient lives at home  Allergies  Allergen Reactions  . Donnatal [Pb-Hyoscy-Atropine-Scopol Er] Anaphylaxis    Reaction to GI cocktail  . Fish Allergy Anaphylaxis, Hives and Rash  . Haldol [Haloperidol Lactate] Other (See Comments)    Chest Pain  . Lidocaine Anaphylaxis    Reaction to GI cocktail  . Maalox [Calcium Carbonate Antacid] Anaphylaxis    Reaction to GI cocktail  . Aspirin Hives and Itching  . Bactrim [Sulfamethoxazole-Trimethoprim] Itching  . Doxycycline Hives and Itching  . Penicillins Hives and Itching    Has patient had a PCN reaction causing immediate rash, facial/tongue/throat swelling, SOB or lightheadedness with hypotension: yes Has patient had a PCN reaction causing severe rash involving mucus membranes or skin necrosis: yes Has patient had a PCN reaction that required hospitalization no Has patient had a PCN reaction occurring  within the last 10 years: yes If all of the above answers are "NO", then may proceed with Cephalospor  . Phenergan [Promethazine Hcl] Nausea And Vomiting  . Zofran [Ondansetron Hcl] Other (See Comments)    Hiccups     Family History  Problem  Relation Age of Onset  . Ovarian cancer Mother   . Cervical cancer Sister   . Diabetes Sister   . Colon cancer Neg Hx      Prior to Admission medications   Medication Sig Start Date End Date Taking? Authorizing Provider  amLODipine (NORVASC) 5 MG tablet Take 5 mg by mouth daily.   Yes Historical Provider, MD  atorvastatin (LIPITOR) 10 MG tablet Take 10 mg by mouth daily.   Yes Historical Provider, MD  brimonidine (ALPHAGAN) 0.15 % ophthalmic solution Place 1 drop into both eyes 3 (three) times daily.   Yes Historical Provider, MD  cyclobenzaprine (FLEXERIL) 10 MG tablet Take 10 mg by mouth at bedtime.    Yes Historical Provider, MD  dicyclomine (BENTYL) 10 MG capsule Take 1 capsule (10 mg total) by mouth 3 (three) times daily as needed for spasms. 09/07/16  Yes Kathie Dike, MD  gabapentin (NEURONTIN) 600 MG tablet Take 600 mg by mouth 4 (four) times daily as needed (for neuropathy).    Yes Historical Provider, MD  hydrochlorothiazide (HYDRODIURIL) 25 MG tablet Take 25 mg by mouth daily.   Yes Historical Provider, MD  insulin NPH-regular Human (NOVOLIN 70/30) (70-30) 100 UNIT/ML injection Inject 35 Units into the skin 2 (two) times daily.   Yes Historical Provider, MD  lisinopril (PRINIVIL,ZESTRIL) 40 MG tablet Take 40 mg by mouth daily.   Yes Historical Provider, MD  oseltamivir (TAMIFLU) 75 MG capsule Take 1 capsule (75 mg total) by mouth 2 (two) times daily. 09/07/16  Yes Kathie Dike, MD  pantoprazole (PROTONIX) 40 MG tablet Take 1 tablet (40 mg total) by mouth 2 (two) times daily before a meal. Patient taking differently: Take 40 mg by mouth daily.  06/14/15  Yes Mahala Menghini, PA-C  prochlorperazine (COMPAZINE) 10 MG tablet Take 1 tablet (10 mg total) by mouth every 6 (six) hours as needed for nausea or vomiting. 09/07/16  Yes Kathie Dike, MD  saxagliptin HCl (ONGLYZA) 5 MG TABS tablet Take 5 mg by mouth daily.   Yes Historical Provider, MD  sucralfate (CARAFATE) 1 g tablet TAKE ONE  TABLET BY MOUTH 4 TIMES DAILY Patient taking differently: Take 1 tablet by mouth twice daily 03/12/16  Yes Annitta Needs, NP  timolol (BETIMOL) 0.5 % ophthalmic solution Place 1 drop into both eyes 2 (two) times daily.   Yes Historical Provider, MD    Physical Exam: BP 170/92   Pulse 105   Temp 98.3 F (36.8 C) (Oral)   Resp 18   Ht 6' (1.829 m)   Wt 136.1 kg (300 lb)   SpO2 97%   BMI 40.69 kg/m   General: middle age black male. Awake and alert and oriented x3. No acute cardiopulmonary distress.  HEENT: Normocephalic atraumatic.  Right and left ears normal in appearance.  Pupils equal, round, reactive to light. Extraocular muscles are intact. Sclerae anicteric and noninjected.  Moist mucosal membranes. No mucosal lesions.  Neck: Neck supple without lymphadenopathy. No carotid bruits. No masses palpated.  Cardiovascular: Regular rate with normal S1-S2 sounds. No murmurs, rubs, gallops auscultated. No JVD.  Respiratory: Good respiratory effort with no wheezes, rales, rhonchi. Lungs clear to auscultation bilaterally.  No accessory muscle use.  Abdomen: Mild epigastric abdominal pain. No rebound or guarding. Soft, nondistended. Active bowel sounds. No masses or hepatosplenomegaly  Skin: No rashes, lesions, or ulcerations.  Dry, warm to touch. 2+ dorsalis pedis and radial pulses. Musculoskeletal: No calf or leg pain. All major joints not erythematous nontender.  No upper or lower joint deformation.  Good ROM.  No contractures  Psychiatric: Intact judgment and insight. Pleasant and cooperative. Neurologic: No focal neurological deficits. Strength is 5/5 and symmetric in upper and lower extremities.  Cranial nerves II through XII are grossly intact.           Labs on Admission: I have personally reviewed following labs and imaging studies  CBC:  Recent Labs Lab 09/13/16 1514  WBC 10.8*  NEUTROABS 8.7*  HGB 13.1  HCT 36.4*  MCV 86.1  PLT 825   Basic Metabolic Panel:  Recent  Labs Lab 09/13/16 1514  NA 139  K 3.7  CL 100*  CO2 29  GLUCOSE 311*  BUN 15  CREATININE 1.23  CALCIUM 9.5   GFR: Estimated Creatinine Clearance: 107.2 mL/min (by C-G formula based on SCr of 1.23 mg/dL). Liver Function Tests:  Recent Labs Lab 09/13/16 1514  AST 23  ALT 21  ALKPHOS 65  BILITOT 1.0  PROT 7.7  ALBUMIN 3.6    Recent Labs Lab 09/13/16 1514  LIPASE 25   No results for input(s): AMMONIA in the last 168 hours. Coagulation Profile: No results for input(s): INR, PROTIME in the last 168 hours. Cardiac Enzymes:  Recent Labs Lab 09/13/16 1514  TROPONINI <0.03   BNP (last 3 results) No results for input(s): PROBNP in the last 8760 hours. HbA1C: No results for input(s): HGBA1C in the last 72 hours. CBG:  Recent Labs Lab 09/06/16 2206 09/07/16 0719 09/07/16 1111 09/13/16 1657  GLUCAP 172* 206* 182* 277*   Lipid Profile: No results for input(s): CHOL, HDL, LDLCALC, TRIG, CHOLHDL, LDLDIRECT in the last 72 hours. Thyroid Function Tests: No results for input(s): TSH, T4TOTAL, FREET4, T3FREE, THYROIDAB in the last 72 hours. Anemia Panel: No results for input(s): VITAMINB12, FOLATE, FERRITIN, TIBC, IRON, RETICCTPCT in the last 72 hours. Urine analysis:    Component Value Date/Time   COLORURINE YELLOW 09/13/2016 1430   APPEARANCEUR CLEAR 09/13/2016 1430   LABSPEC 1.015 09/13/2016 1430   PHURINE 7.0 09/13/2016 1430   GLUCOSEU >=500 (A) 09/13/2016 1430   HGBUR SMALL (A) 09/13/2016 1430   BILIRUBINUR NEGATIVE 09/13/2016 1430   KETONESUR 5 (A) 09/13/2016 1430   PROTEINUR >=300 (A) 09/13/2016 1430   UROBILINOGEN 0.2 06/07/2015 1353   NITRITE NEGATIVE 09/13/2016 1430   LEUKOCYTESUR NEGATIVE 09/13/2016 1430   Sepsis Labs: @LABRCNTIP (procalcitonin:4,lacticidven:4) )No results found for this or any previous visit (from the past 240 hour(s)).   Radiological Exams on Admission: Dg Abd Acute W/chest  Result Date: 09/13/2016 CLINICAL DATA:  Nausea and  vomiting. History of gastritis and gastroparesis. EXAM: DG ABDOMEN ACUTE W/ 1V CHEST COMPARISON:  Chest radiograph - 09/05/2016 FINDINGS: Grossly unchanged cardiac silhouette and mediastinal contours given persistently reduced lung volumes. Overall improved aeration of lungs. No focal airspace opacities. No pleural effusion or pneumothorax. No evidence of edema. Moderate colonic stool burden without evidence of enteric obstruction. No pneumoperitoneum, pneumatosis or portal venous gas. No definitive abnormal intra-abdominal calcifications. No acute osseus abnormalities. Degenerative change of the lower lumbar spine. IMPRESSION: 1.  No acute cardiopulmonary disease. 2. Moderate colonic stool burden without evidence of enteric obstruction Electronically Signed   By: Eldridge Abrahams.D.  On: 09/13/2016 15:46    EKG: Independently reviewed. Sinus rhythm  Assessment/Plan:  Principal Problem:   Intractable nausea and vomiting Active Problems:   Diabetes mellitus type 2 with complications, uncontrolled ([place])   Essential hypertension   Gastroparesis due to DM [facility])    This patient was discussed with the ED physician, including pertinent vitals, physical exam findings, labs, and imaging.  We also discussed care given by the ED provider.  #1 Intractable N/V #2 Gastroparesis  Observation  Reglan 10mg  q6  Compazine  Clear liquid #3 DM2  Lantus 50mg  qhs  SSI  AC, qhs #4 HTN  Home antihypertensives  DVT prophylaxis: lovenox Consultants: none Code Status: full Family Communication: father  Disposition Plan: home following improvement of symptoms    Truett Mainland, DO [facility] Pager [phone]  If 7PM-7AM, please contact night-coverage [email or web address] Password [email or web address]

## 2016-09-13 NOTE — ED Notes (Signed)
Pt having nausea and vomiting again

## 2016-09-13 NOTE — ED Provider Notes (Signed)
[facility] DEPT Provider Note   CSN: [id number] Arrival date & time: 09/13/16  1422     History   Chief Complaint Chief Complaint  Patient presents with  . Emesis  . Chest Pain    HPI Timothy Clark is a 46 y.o. male.  HPI  Pt was seen at 1430. Per pt, c/o gradual onset and persistence of multiple intermittent episodes of N/V that began this morning at 0400. Has been associated with generalized abd pain and chest pain. Describes his symptoms as "my gastroparesis is acting up" "because I ate a salad yesterday." Denies any change in his usual chronic abd pain. Pt was discharged from the hospital 1 week ago for dx influenza A and intractable N/V. Denies palpitations, no SOB, no back pain, no fevers, no black or blood in stools or emesis.    Past Medical History:  Diagnosis Date  . Chronic abdominal pain   . Esophagitis   . Eye hemorrhage   . Gastritis   . Gastroparesis   . GERD (gastroesophageal reflux disease)   . Glaucoma   . Helicobacter pylori (H. pylori) infection   . Hiccups   . Hypertension   . Nausea and vomiting    chronic, recurrent  . Type 2 diabetes mellitus with complications (HCC)    diagnosed around age 56    Patient Active Problem List   Diagnosis Date Noted  . AKI (acute kidney injury) ([place]) 09/07/2016  . Insulin dependent diabetes mellitus ([place]) 09/07/2016  . Orthostatic syncope 09/05/2016  . Syncope and collapse   . 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 Diarrhea 09/27/2015  . Gastroparesis 02/17/2015  . Nausea & vomiting 02/17/2015  . Dehydration 02/17/2015  . Gastroparesis due to DM ([place]) 02/06/2015  . Intractable vomiting 01/15/2015  . Gastritis   . Essential hypertension 12/06/2014  . H. pylori infection 11/27/2014  . Acute esophagitis 10/29/2014  . Abdominal pain, epigastric   . Mucosal abnormality of stomach   . Mucosal abnormality of esophagus   . Intractable vomiting with nausea 10/25/2014  . Elevated blood pressure 10/25/2014  .  Nausea vomiting and diarrhea 10/25/2014  . DIABETIC FOOT ULCER 02/14/2010  . LIMB PAIN 12/11/2009  . UNSPECIFIED PERIPHERAL VASCULAR DISEASE 10/19/2009  . ERECTILE DYSFUNCTION, ORGANIC 10/19/2009  . NUMBNESS 07/20/2009  . Diabetes mellitus type 2 with complications, uncontrolled ([place]) 03/22/2009  . HYPERLIPIDEMIA 03/22/2009  . DEPRESSIVE DISORDER 03/22/2009    Past Surgical History:  Procedure Laterality Date  . CATARACT EXTRACTION W/PHACO Left 05/19/2016   Procedure: CATARACT EXTRACTION PHACO AND INTRAOCULAR LENS PLACEMENT LEFT EYE;  Surgeon: Tonny Branch, MD;  Location: [facility];  Service: Ophthalmology;  Laterality: Left;  CDE: 7.30  . CATARACT EXTRACTION W/PHACO Right 06/23/2016   Procedure: CATARACT EXTRACTION PHACO AND INTRAOCULAR LENS PLACEMENT RIGHT EYE CDE=9.87;  Surgeon: Tonny Branch, MD;  Location: [facility];  Service: Ophthalmology;  Laterality: Right;  right  . ESOPHAGOGASTRODUODENOSCOPY  2015   Dr. Britta Mccreedy  . ESOPHAGOGASTRODUODENOSCOPY N/A 10/26/2014   RMR: Distal esophagititis-likely reflux related although an element of pill induced injuruy no exclueded  status post biopsy. Diffusely abnormal gastric mucosa of uncertain signigicane -status post gastric biopsy. Focal area of excoriation in the cardia most consistant with a trauma of heaving.   [name] EYE SURGERY  2015   stent placed to left eye  . EYE SURGERY         Home Medications    Prior to Admission medications   Medication Sig Start Date End Date Taking? Authorizing  Provider  amLODipine (NORVASC) 5 MG tablet Take 5 mg by mouth daily.    Historical Provider, MD  atorvastatin (LIPITOR) 10 MG tablet Take 10 mg by mouth daily.    Historical Provider, MD  brimonidine (ALPHAGAN) 0.15 % ophthalmic solution Place 1 drop into both eyes 3 (three) times daily.    Historical Provider, MD  cyclobenzaprine (FLEXERIL) 10 MG tablet Take 10 mg by mouth at bedtime.     Historical Provider, MD  dicyclomine (BENTYL) 10 MG capsule Take 1 capsule  (10 mg total) by mouth 3 (three) times daily as needed for spasms. 09/07/16   Kathie Dike, MD  gabapentin (NEURONTIN) 600 MG tablet Take 600 mg by mouth 4 (four) times daily as needed (for neuropathy).     Historical Provider, MD  hydrochlorothiazide (HYDRODIURIL) 25 MG tablet Take 25 mg by mouth daily.    Historical Provider, MD  insulin NPH-regular Human (NOVOLIN 70/30) (70-30) 100 UNIT/ML injection Inject 35 Units into the skin 2 (two) times daily.    Historical Provider, MD  lisinopril (PRINIVIL,ZESTRIL) 40 MG tablet Take 40 mg by mouth daily.    Historical Provider, MD  oseltamivir (TAMIFLU) 75 MG capsule Take 1 capsule (75 mg total) by mouth 2 (two) times daily. 09/07/16   Kathie Dike, MD  pantoprazole (PROTONIX) 40 MG tablet Take 1 tablet (40 mg total) by mouth 2 (two) times daily before a meal. Patient taking differently: Take 40 mg by mouth daily.  06/14/15   Mahala Menghini, PA-C  prochlorperazine (COMPAZINE) 10 MG tablet Take 1 tablet (10 mg total) by mouth every 6 (six) hours as needed for nausea or vomiting. 09/07/16   Kathie Dike, MD  saxagliptin HCl (ONGLYZA) 5 MG TABS tablet Take 5 mg by mouth daily.    Historical Provider, MD  sucralfate (CARAFATE) 1 g tablet TAKE ONE TABLET BY MOUTH 4 TIMES DAILY Patient taking differently: Take 1 tablet by mouth twice daily 03/12/16   Annitta Needs, NP  timolol (BETIMOL) 0.5 % ophthalmic solution Place 1 drop into both eyes 2 (two) times daily.    Historical Provider, MD    Family History Family History  Problem Relation Age of Onset  . Ovarian cancer Mother   . Cervical cancer Sister   . Diabetes Sister   . Colon cancer Neg Hx     Social History Social History  Substance Use Topics  . Smoking status: Never Smoker  . Smokeless tobacco: Never Used  . Alcohol use No     Allergies   Donnatal [pb-hyoscy-atropine-scopol er]; Fish allergy; Haldol [haloperidol lactate]; Lidocaine; Maalox [calcium carbonate antacid]; Aspirin; Bactrim  [sulfamethoxazole-trimethoprim]; Doxycycline; Penicillins; Phenergan [promethazine hcl]; and Zofran [ondansetron hcl]   Review of Systems Review of Systems ROS: Statement: All systems negative except as marked or noted in the HPI; Constitutional: Negative for fever and chills. ; ; Eyes: Negative for eye pain, redness and discharge. ; ; ENMT: Negative for ear pain, hoarseness, nasal congestion, sinus pressure and sore throat. ; ; Cardiovascular: Negative for chest pain, palpitations, diaphoresis, dyspnea and peripheral edema. ; ; Respiratory: Negative for cough, wheezing and stridor. ; ; Gastrointestinal: +N/V, abd pain. Negative for diarrhea, blood in stool, hematemesis, jaundice and rectal bleeding. . ; ; Genitourinary: Negative for dysuria, flank pain and hematuria. ; ; Musculoskeletal: Negative for back pain and neck pain. Negative for swelling and trauma.; ; Skin: Negative for pruritus, rash, abrasions, blisters, bruising and skin lesion.; ; Neuro: Negative for headache, lightheadedness and neck stiffness. Negative  for weakness, altered level of consciousness, altered mental status, extremity weakness, paresthesias, involuntary movement, seizure and syncope.       Physical Exam Updated Vital Signs BP 146/96 (BP Location: Left Arm)   Pulse 91   Temp 98.3 F (36.8 C) (Oral)   Resp 18   Ht 6' (1.829 m)   Wt 300 lb (136.1 kg)   SpO2 100%   BMI 40.69 kg/m   Physical Exam 1435: Physical examination:  Nursing notes reviewed; Vital signs and O2 SAT reviewed;  Constitutional: Well developed, Well nourished, Well hydrated, Uncomfortable appearing.; Head:  Normocephalic, atraumatic; Eyes: EOMI, PERRL, No scleral icterus; ENMT: Mouth and pharynx normal, Mucous membranes moist; Neck: Supple, Full range of motion, No lymphadenopathy; Cardiovascular: Regular rate and rhythm, No gallop; Respiratory: Breath sounds clear & equal bilaterally, No wheezes.  Speaking full sentences with ease, Normal  respiratory effort/excursion; Chest: Nontender, Movement normal; Abdomen: Hanging over stretcher railing loudly retching into emesis bag. Soft, +mild mid-epigastric area tender to palp. Nondistended, Normal bowel sounds; Genitourinary: No CVA tenderness; Extremities: Pulses normal, No tenderness, No edema, No calf edema or asymmetry.; Neuro: AA&Ox3, Major CN grossly intact.  Speech clear. No gross focal motor or sensory deficits in extremities.; Skin: Color normal, Warm, Dry.   ED Treatments / Results  Labs (all labs ordered are listed, but only abnormal results are displayed)   EKG  EKG Interpretation None       Radiology   Procedures Procedures (including critical care time)  Medications Ordered in ED Medications  famotidine (PEPCID) IVPB 20 mg premix (20 mg Intravenous New Bag/Given 09/13/16 1455)  sodium chloride 0.9 % bolus 1,000 mL (1,000 mLs Intravenous New Bag/Given 09/13/16 1459)  metoCLOPramide (REGLAN) injection 10 mg (10 mg Intravenous Given 09/13/16 1454)  diphenhydrAMINE (BENADRYL) injection 25 mg (25 mg Intravenous Given 09/13/16 1455)  LORazepam (ATIVAN) injection 1 mg (1 mg Intravenous Given 09/13/16 1453)     Initial Impression / Assessment and Plan / ED Course  I have reviewed the triage vital signs and the nursing notes.  Pertinent labs & imaging results that were available during my care of the patient were reviewed by me and considered in my medical decision making (see chart for details).  MDM Reviewed: previous chart, nursing note and vitals Reviewed previous: labs and ECG Interpretation: labs, ECG and x-ray   Results for orders placed or performed during the hospital encounter of 09/13/16  Lipase, blood  Result Value Ref Range   Lipase 25 11 - 51 U/L  Comprehensive metabolic panel  Result Value Ref Range   Sodium 139 135 - 145 mmol/L   Potassium 3.7 3.5 - 5.1 mmol/L   Chloride 100 (L) 101 - 111 mmol/L   CO2 29 22 - 32 mmol/L   Glucose, Bld 311 (H) 65  - 99 mg/dL   BUN 15 6 - 20 mg/dL   Creatinine, Ser 1.23 0.61 - 1.24 mg/dL   Calcium 9.5 8.9 - 10.3 mg/dL   Total Protein 7.7 6.5 - 8.1 g/dL   Albumin 3.6 3.5 - 5.0 g/dL   AST 23 15 - 41 U/L   ALT 21 17 - 63 U/L   Alkaline Phosphatase 65 38 - 126 U/L   Total Bilirubin 1.0 0.3 - 1.2 mg/dL   GFR calc non Af Amer >60 >60 mL/min   GFR calc Af Amer >60 >60 mL/min   Anion gap 10 5 - 15  Urinalysis, Routine w reflex microscopic  Result Value Ref Range  Color, Urine YELLOW YELLOW   APPearance CLEAR CLEAR   Specific Gravity, Urine 1.015 1.005 - 1.030   pH 7.0 5.0 - 8.0   Glucose, UA >=500 (A) NEGATIVE mg/dL   Hgb urine dipstick SMALL (A) NEGATIVE   Bilirubin Urine NEGATIVE NEGATIVE   Ketones, ur 5 (A) NEGATIVE mg/dL   Protein, ur >=300 (A) NEGATIVE mg/dL   Nitrite NEGATIVE NEGATIVE   Leukocytes, UA NEGATIVE NEGATIVE   RBC / HPF 6-30 0 - 5 RBC/hpf   WBC, UA 0-5 0 - 5 WBC/hpf   Bacteria, UA RARE (A) NONE SEEN   Mucous PRESENT    Sperm, UA PRESENT   CBC with Differential  Result Value Ref Range   WBC 10.8 (H) 4.0 - 10.5 K/uL   RBC 4.23 4.22 - 5.81 MIL/uL   Hemoglobin 13.1 13.0 - 17.0 g/dL   HCT 36.4 (L) 39.0 - 52.0 %   MCV 86.1 78.0 - 100.0 fL   MCH 31.0 26.0 - 34.0 pg   MCHC 36.0 30.0 - 36.0 g/dL   RDW 12.1 11.5 - 15.5 %   Platelets 179 150 - 400 K/uL   Neutrophils Relative % 80 %   Neutro Abs 8.7 (H) 1.7 - 7.7 K/uL   Lymphocytes Relative 14 %   Lymphs Abs 1.5 0.7 - 4.0 K/uL   Monocytes Relative 5 %   Monocytes Absolute 0.5 0.1 - 1.0 K/uL   Eosinophils Relative 1 %   Eosinophils Absolute 0.1 0.0 - 0.7 K/uL   Basophils Relative 0 %   Basophils Absolute 0.0 0.0 - 0.1 K/uL  Troponin I  Result Value Ref Range   Troponin I <0.03 <0.03 ng/mL  CBG monitoring, ED  Result Value Ref Range   Glucose-Capillary 277 (H) 65 - 99 mg/dL   Dg Abd Acute W/chest Result Date: 09/13/2016 CLINICAL DATA:  Nausea and vomiting. History of gastritis and gastroparesis. EXAM: DG ABDOMEN ACUTE W/  1V CHEST COMPARISON:  Chest radiograph - 09/05/2016 FINDINGS: Grossly unchanged cardiac silhouette and mediastinal contours given persistently reduced lung volumes. Overall improved aeration of lungs. No focal airspace opacities. No pleural effusion or pneumothorax. No evidence of edema. Moderate colonic stool burden without evidence of enteric obstruction. No pneumoperitoneum, pneumatosis or portal venous gas. No definitive abnormal intra-abdominal calcifications. No acute osseus abnormalities. Degenerative change of the lower lumbar spine. IMPRESSION: 1.  No acute cardiopulmonary disease. 2. Moderate colonic stool burden without evidence of enteric obstruction Electronically Signed   By: Sandi Mariscal M.D.   On: 09/13/2016 15:46    1810:  Unable to tol PO despite multiple doses of IV reglan, benadryl, ativan. CBG trending downward after IVF; AG normal. Dx and testing d/w pt and family.  Questions answered.  Verb understanding, agreeable to admit. T/C to [facility] Dr. Nehemiah Settle, case discussed, including:  HPI, pertinent PM/SHx, VS/PE, dx testing, ED course and treatment:  Agreeable to admit, requests to write temporary orders, obtain observation medical bed to team [facility].   Final Clinical Impressions(s) / ED Diagnoses   Final diagnoses:  None    New Prescriptions New Prescriptions   No medications on file     Francine Graven, DO 09/14/16 2117

## 2016-09-13 NOTE — ED Notes (Signed)
Given diet coke

## 2016-09-13 NOTE — ED Notes (Signed)
Pt states he is still nauseated and his stomach is hurting 10/10.  EDP notified.

## 2016-09-14 DIAGNOSIS — I10 Essential (primary) hypertension: Secondary | ICD-10-CM | POA: Diagnosis not present

## 2016-09-14 DIAGNOSIS — E1165 Type 2 diabetes mellitus with hyperglycemia: Secondary | ICD-10-CM

## 2016-09-14 DIAGNOSIS — Z8049 Family history of malignant neoplasm of other genital organs: Secondary | ICD-10-CM | POA: Diagnosis not present

## 2016-09-14 DIAGNOSIS — H409 Unspecified glaucoma: Secondary | ICD-10-CM | POA: Diagnosis present

## 2016-09-14 DIAGNOSIS — J101 Influenza due to other identified influenza virus with other respiratory manifestations: Secondary | ICD-10-CM | POA: Diagnosis present

## 2016-09-14 DIAGNOSIS — Z794 Long term (current) use of insulin: Secondary | ICD-10-CM | POA: Diagnosis not present

## 2016-09-14 DIAGNOSIS — Z880 Allergy status to penicillin: Secondary | ICD-10-CM | POA: Diagnosis not present

## 2016-09-14 DIAGNOSIS — E1143 Type 2 diabetes mellitus with diabetic autonomic (poly)neuropathy: Secondary | ICD-10-CM | POA: Diagnosis not present

## 2016-09-14 DIAGNOSIS — E785 Hyperlipidemia, unspecified: Secondary | ICD-10-CM | POA: Diagnosis present

## 2016-09-14 DIAGNOSIS — E118 Type 2 diabetes mellitus with unspecified complications: Secondary | ICD-10-CM | POA: Diagnosis not present

## 2016-09-14 DIAGNOSIS — K3184 Gastroparesis: Secondary | ICD-10-CM | POA: Diagnosis not present

## 2016-09-14 DIAGNOSIS — K219 Gastro-esophageal reflux disease without esophagitis: Secondary | ICD-10-CM | POA: Diagnosis present

## 2016-09-14 DIAGNOSIS — Z91013 Allergy to seafood: Secondary | ICD-10-CM | POA: Diagnosis not present

## 2016-09-14 DIAGNOSIS — R112 Nausea with vomiting, unspecified: Secondary | ICD-10-CM | POA: Diagnosis not present

## 2016-09-14 DIAGNOSIS — Z833 Family history of diabetes mellitus: Secondary | ICD-10-CM | POA: Diagnosis not present

## 2016-09-14 DIAGNOSIS — G43A1 Cyclical vomiting, intractable: Secondary | ICD-10-CM | POA: Diagnosis present

## 2016-09-14 DIAGNOSIS — Z886 Allergy status to analgesic agent status: Secondary | ICD-10-CM | POA: Diagnosis not present

## 2016-09-14 DIAGNOSIS — Z888 Allergy status to other drugs, medicaments and biological substances status: Secondary | ICD-10-CM | POA: Diagnosis not present

## 2016-09-14 DIAGNOSIS — E876 Hypokalemia: Secondary | ICD-10-CM | POA: Diagnosis present

## 2016-09-14 DIAGNOSIS — Z8041 Family history of malignant neoplasm of ovary: Secondary | ICD-10-CM | POA: Diagnosis not present

## 2016-09-14 LAB — GLUCOSE, CAPILLARY
Glucose-Capillary: 148 mg/dL — ABNORMAL HIGH (ref 65–99)
Glucose-Capillary: 171 mg/dL — ABNORMAL HIGH (ref 65–99)
Glucose-Capillary: 189 mg/dL — ABNORMAL HIGH (ref 65–99)
Glucose-Capillary: 188 mg/dL — ABNORMAL HIGH (ref 65–99)

## 2016-09-14 LAB — BASIC METABOLIC PANEL
Anion gap: 5 (ref 5–15)
BUN: 15 mg/dL (ref 6–20)
CO2: 27 mmol/L (ref 22–32)
Calcium: 8 mg/dL — ABNORMAL LOW (ref 8.9–10.3)
Chloride: 106 mmol/L (ref 101–111)
Creatinine, Ser: 1.08 mg/dL (ref 0.61–1.24)
GFR calc Af Amer: >60 mL/min (ref >60)
GFR calc non Af Amer: >60 mL/min (ref >60)
Glucose, Bld: 164 mg/dL — ABNORMAL HIGH (ref 65–99)
Potassium: 3.1 mmol/L — ABNORMAL LOW (ref 3.5–5.1)
Sodium: 138 mmol/L (ref 135–145)

## 2016-09-14 LAB — CBC
HCT: 32.5 % — ABNORMAL LOW (ref 39.0–52.0)
Hemoglobin: 11.7 g/dL — ABNORMAL LOW (ref 13.0–17.0)
MCH: 31.4 pg (ref 26.0–34.0)
MCHC: 36 g/dL (ref 30.0–36.0)
MCV: 87.1 fL (ref 78.0–100.0)
Platelets: 184 10*3/uL (ref 150–400)
RBC: 3.73 MIL/uL — ABNORMAL LOW (ref 4.22–5.81)
RDW: 12.4 % (ref 11.5–15.5)
WBC: 9.2 10*3/uL (ref 4.0–10.5)

## 2016-09-14 MED ORDER — BRIMONIDINE TARTRATE 0.2 % OP SOLN
1.0000 [drp] | Freq: Three times a day (TID) | OPHTHALMIC | Status: DC
  Administered 2016-09-14 – 2016-09-15 (×3): 1 [drp] via OPHTHALMIC
  Filled 2016-09-14: qty 5

## 2016-09-14 MED ORDER — GABAPENTIN 300 MG PO CAPS: 600.0000 mg | ORAL_CAPSULE | Freq: Four times a day (QID) | ORAL | Status: DC | PRN

## 2016-09-14 MED ORDER — POTASSIUM CHLORIDE CRYS ER 20 MEQ PO TBCR
30.0000 meq | EXTENDED_RELEASE_TABLET | Freq: Once | ORAL | Status: AC
  Administered 2016-09-14: 30 meq via ORAL
  Filled 2016-09-14: qty 1

## 2016-09-14 MED ORDER — TIMOLOL MALEATE 0.5 % OP SOLN
1.0000 [drp] | Freq: Two times a day (BID) | OPHTHALMIC | Status: DC
  Administered 2016-09-14 – 2016-09-15 (×3): 1 [drp] via OPHTHALMIC
  Filled 2016-09-14: qty 5

## 2016-09-14 MED ORDER — ENOXAPARIN SODIUM 80 MG/0.8ML ~~LOC~~ SOLN
65.0000 mg | SUBCUTANEOUS | Status: DC
  Administered 2016-09-14: 65 mg via SUBCUTANEOUS
  Filled 2016-09-14: qty 0.8

## 2016-09-14 NOTE — Progress Notes (Signed)
Patient ID: Timothy Clark, male   DOB: [birth year], 46 y.o.   MRN: [id number]                                                                PROGRESS NOTE                                                                                                                                                                                                             Patient Demographics:    Timothy Clark, is a 46 y.o. male, DOB - 10-11-1970, IEP:[id number]  Admit date - 09/13/2016   Admitting Physician Truett Mainland, DO  Outpatient Primary MD for the patient is [facility], PA-C  LOS - 0  Outpatient Specialists:   Chief Complaint  Patient presents with  . Emesis  . Chest Pain       Brief Narrative   46 y.o. male with a history of DM2 on insulin, gastroparesis, GERD, HTN, glaucoma. Patient was admitted for Flu and gastroparesis from 1/26-1/28. Patient was discharged to home on tamiflu and tolerating an oral diet. Yesterday, patient had a salad and started to have vomiting. Vomited multiple times throughout night - had little sleep. Describes emesis as stomach contents. Regular antiemetics not helpful. Oral food and liquid makes symptoms worse. Has upper abdominal pain, no radiation.  Emergency Department Course: Xray and labs normal. Pt received IV reglan and benadryl. Failed PO challenge   Subjective:    Timothy Clark today has been feeling better. Still with some nausea.  No emesis this morning.    Slight congestion. Slight chill.  reglan appear to be helping.  Pt denies headache, cp, palp, abd pain, diarrhea, brbpr.     Assessment  & Plan :    Principal Problem:   Intractable nausea and vomiting Active Problems:   Diabetes mellitus type 2 with complications, uncontrolled (HCC)   Essential hypertension   Gastroparesis due to DM ([place])   1. Intractable nausea and vomitting secondary to DIabetic gastroparesis Clear liquid diet today, advance as tolerated Agree with reglan 10mg  iv  q6h  Cont compazine prn  2. Influenza A + (w slight chills) Treat with tamiflu 75mg  po bid Cont droplet precautions  3. Gerd Cont protonix  4. Dm2 Continue lantus 50 units [place] qhs fsbs ac and  qhs, ISS  5. Hypertension Cont lisinopril , amlodipine, hydrochlorothiazide  6. Hyperlipidemia Cont atorvastatin     Code Status : FULL CODE  Family Communication  : w patient  Disposition Plan  :  home  Barriers For Discharge :   Consults  :    Procedures  :   DVT Prophylaxis  :  Lovenox  - SCDs   Lab Results  Component Value Date   PLT 179 09/13/2016    Antibiotics  :  Tamiflu 2/4=>  Anti-infectives    Start     Dose/Rate Route Frequency Ordered Stop   09/13/16 2200  oseltamivir (TAMIFLU) capsule 75 mg     75 mg Oral 2 times daily 09/13/16 1955 09/18/16 2159        Objective:   Vitals:   09/13/16 1830 09/13/16 1845 09/13/16 2132 09/14/16 0537  BP: 170/92  (!) 171/80 (!) 151/89  Pulse:  105 100 80  Resp:   20 20  Temp:   99.6 F (37.6 C) 98.1 F (36.7 C)  TempSrc:   Oral Oral  SpO2:  97% 99% 98%  Weight:   128.2 kg (282 lb 9.6 oz) 128 kg (282 lb 3 oz)  Height:        Wt Readings from Last 3 Encounters:  09/14/16 128 kg (282 lb 3 oz)  09/05/16 136.1 kg (300 lb)  09/05/16 136.1 kg (300 lb)     Intake/Output Summary (Last 24 hours) at 09/14/16 0551 Last data filed at 09/14/16 0056  Gross per 24 hour  Intake                0 ml  Output              450 ml  Net             -450 ml     Physical Exam  Awake Alert, Oriented X 3, No new F.N deficits, Normal affect [name],PERRAL Supple Neck,No JVD, No cervical lymphadenopathy appriciated.  Symmetrical Chest wall movement, Good air movement bilaterally, CTAB RRR,No Gallops,Rubs or new Murmurs, No Parasternal Heave +ve B.Sounds, Abd Soft, No tenderness, No organomegaly appriciated, No rebound - guarding or rigidity. No Cyanosis, Clubbing or edema, No new Rash or bruise      Data Review:     CBC  Recent Labs Lab 09/13/16 1514  WBC 10.8*  HGB 13.1  HCT 36.4*  PLT 179  MCV 86.1  MCH 31.0  MCHC 36.0  RDW 12.1  LYMPHSABS 1.5  MONOABS 0.5  EOSABS 0.1  BASOSABS 0.0    Chemistries   Recent Labs Lab 09/13/16 1514  NA 139  K 3.7  CL 100*  CO2 29  GLUCOSE 311*  BUN 15  CREATININE 1.23  CALCIUM 9.5  AST 23  ALT 21  ALKPHOS 65  BILITOT 1.0   ------------------------------------------------------------------------------------------------------------------ No results for input(s): CHOL, HDL, LDLCALC, TRIG, CHOLHDL, LDLDIRECT in the last 72 hours.  Lab Results  Component Value Date   HGBA1C 7.3 (H) 02/17/2015   ------------------------------------------------------------------------------------------------------------------ No results for input(s): TSH, T4TOTAL, T3FREE, THYROIDAB in the last 72 hours.  Invalid input(s): FREET3 ------------------------------------------------------------------------------------------------------------------ No results for input(s): VITAMINB12, FOLATE, FERRITIN, TIBC, IRON, RETICCTPCT in the last 72 hours.  Coagulation profile No results for input(s): INR, PROTIME in the last 168 hours.  No results for input(s): DDIMER in the last 72 hours.  Cardiac Enzymes  Recent Labs Lab 09/13/16 1514  TROPONINI <0.03   ------------------------------------------------------------------------------------------------------------------ No results found for: BNP  Inpatient Medications  Scheduled Meds: . sodium chloride   Intravenous STAT  . amLODipine  5 mg Oral Daily  . atorvastatin  10 mg Oral Daily  . cyclobenzaprine  10 mg Oral QHS  . enoxaparin (LOVENOX) injection  70 mg Subcutaneous Q24H  . hydrochlorothiazide  25 mg Oral Daily  . insulin aspart  0-20 Units Subcutaneous TID WC  . insulin aspart  0-5 Units Subcutaneous QHS  . insulin glargine  50 Units Subcutaneous QHS  . linagliptin  5 mg Oral Daily  . lisinopril   40 mg Oral Daily  . metoCLOPramide (REGLAN) injection  10 mg Intravenous Q6H  . oseltamivir  75 mg Oral BID  . pantoprazole  40 mg Oral BID AC  . sucralfate  1 g Oral BID   Continuous Infusions: . 0.9 % NaCl with KCl 20 mEq / L 125 mL/hr at 09/14/16 0401   PRN Meds:.acetaminophen **OR** acetaminophen, dicyclomine, gabapentin, polyethylene glycol, prochlorperazine  Micro Results No results found for this or any previous visit (from the past 240 hour(s)).  Radiology Reports Dg Chest Port 1 View  Result Date: 09/05/2016 CLINICAL DATA:  46 year old male with fever and nausea and vomiting. EXAM: PORTABLE CHEST 1 VIEW COMPARISON:  Chest radiograph dated 03/18/2015 FINDINGS: There is shallow inspiration with minimal bibasilar atelectatic changes. There is no focal consolidation, pleural effusion, or pneumothorax. The cardiac silhouette is within normal limits. No acute osseous pathology. IMPRESSION: No active disease. Electronically Signed   By: Anner Crete M.D.   On: 09/05/2016 23:52   Dg Abd Acute W/chest  Result Date: 09/13/2016 CLINICAL DATA:  Nausea and vomiting. History of gastritis and gastroparesis. EXAM: DG ABDOMEN ACUTE W/ 1V CHEST COMPARISON:  Chest radiograph - 09/05/2016 FINDINGS: Grossly unchanged cardiac silhouette and mediastinal contours given persistently reduced lung volumes. Overall improved aeration of lungs. No focal airspace opacities. No pleural effusion or pneumothorax. No evidence of edema. Moderate colonic stool burden without evidence of enteric obstruction. No pneumoperitoneum, pneumatosis or portal venous gas. No definitive abnormal intra-abdominal calcifications. No acute osseus abnormalities. Degenerative change of the lower lumbar spine. IMPRESSION: 1.  No acute cardiopulmonary disease. 2. Moderate colonic stool burden without evidence of enteric obstruction Electronically Signed   By: Sandi Mariscal M.D.   On: 09/13/2016 15:46    Time Spent in minutes   30   Jani Gravel M.D on 09/14/2016 at 5:51 AM  Between 7am to 7pm - Pager - [phone]  After 7pm go to [email or web address] - password [email or web address]  [facility] -  Office  [phone]

## 2016-09-15 DIAGNOSIS — K3184 Gastroparesis: Secondary | ICD-10-CM

## 2016-09-15 DIAGNOSIS — E1143 Type 2 diabetes mellitus with diabetic autonomic (poly)neuropathy: Principal | ICD-10-CM

## 2016-09-15 LAB — COMPREHENSIVE METABOLIC PANEL
ALT: 14 U/L — ABNORMAL LOW (ref 17–63)
AST: 16 U/L (ref 15–41)
Albumin: 2.8 g/dL — ABNORMAL LOW (ref 3.5–5.0)
Alkaline Phosphatase: 46 U/L (ref 38–126)
Anion gap: 6 (ref 5–15)
BUN: 8 mg/dL (ref 6–20)
CO2: 26 mmol/L (ref 22–32)
Calcium: 8.2 mg/dL — ABNORMAL LOW (ref 8.9–10.3)
Chloride: 106 mmol/L (ref 101–111)
Creatinine, Ser: 0.99 mg/dL (ref 0.61–1.24)
GFR calc Af Amer: >60 mL/min (ref >60)
GFR calc non Af Amer: >60 mL/min (ref >60)
Glucose, Bld: 102 mg/dL — ABNORMAL HIGH (ref 65–99)
Potassium: 3.1 mmol/L — ABNORMAL LOW (ref 3.5–5.1)
Sodium: 138 mmol/L (ref 135–145)
Total Bilirubin: 0.7 mg/dL (ref 0.3–1.2)
Total Protein: 6 g/dL — ABNORMAL LOW (ref 6.5–8.1)

## 2016-09-15 LAB — CBC
HCT: 35.9 % — ABNORMAL LOW (ref 39.0–52.0)
Hemoglobin: 12.6 g/dL — ABNORMAL LOW (ref 13.0–17.0)
MCH: 30.9 pg (ref 26.0–34.0)
MCHC: 35.1 g/dL (ref 30.0–36.0)
MCV: 88 fL (ref 78.0–100.0)
Platelets: 183 10*3/uL (ref 150–400)
RBC: 4.08 MIL/uL — ABNORMAL LOW (ref 4.22–5.81)
RDW: 12.6 % (ref 11.5–15.5)
WBC: 7.3 10*3/uL (ref 4.0–10.5)

## 2016-09-15 LAB — GLUCOSE, CAPILLARY
Glucose-Capillary: 91 mg/dL (ref 65–99)
Glucose-Capillary: 100 mg/dL — ABNORMAL HIGH (ref 65–99)

## 2016-09-15 MED ORDER — AMLODIPINE BESYLATE 5 MG PO TABS
5.0000 mg | ORAL_TABLET | Freq: Once | ORAL | Status: AC
  Administered 2016-09-15: 5 mg via ORAL
  Filled 2016-09-15: qty 1

## 2016-09-15 MED ORDER — OSELTAMIVIR PHOSPHATE 75 MG PO CAPS: 75.0000 mg | ORAL_CAPSULE | Freq: Two times a day (BID) | ORAL | 0 refills | Status: DC

## 2016-09-15 MED ORDER — PANTOPRAZOLE SODIUM 40 MG PO TBEC: 40.0000 mg | DELAYED_RELEASE_TABLET | Freq: Two times a day (BID) | ORAL | 0 refills | Status: DC

## 2016-09-15 MED ORDER — HYDRALAZINE HCL 20 MG/ML IJ SOLN
10.0000 mg | Freq: Once | INTRAMUSCULAR | Status: AC
  Administered 2016-09-15: 10 mg via INTRAVENOUS
  Filled 2016-09-15: qty 1

## 2016-09-15 MED ORDER — METOCLOPRAMIDE HCL 5 MG PO TABS: 5.0000 mg | ORAL_TABLET | Freq: Four times a day (QID) | ORAL | 0 refills | Status: DC

## 2016-09-15 MED ORDER — POTASSIUM CHLORIDE 20 MEQ/15ML (10%) PO SOLN
40.0000 meq | Freq: Once | ORAL | Status: AC
  Administered 2016-09-15: 40 meq via ORAL
  Filled 2016-09-15: qty 30

## 2016-09-15 NOTE — Discharge Instructions (Signed)
Please follow up with your pcp within 1 week to check your bp and also your gastroparesis

## 2016-09-15 NOTE — Progress Notes (Signed)
Patient's IV removed.  Site WNL/  AVS reviewed with patient.  Verbalized understanding of discharge instructions, physician follow-up, medications.  Patient transported by NT via wheelchair to main entrance for discharge.  Patient stable at time of discharge.  Reports all belongings intact and in possession.

## 2016-09-15 NOTE — Progress Notes (Addendum)
Dr. Maudie Mercury notified via text page of patient's BP after receiving blood pressure medications.  Patient to be discharged this afternoon.   Dr. Maudie Mercury returned page and gave order for one time dose of Norvasc 5 mg and discharge patient with PCP follow-up.

## 2016-09-15 NOTE — Discharge Summary (Signed)
Timothy Clark, is a 46 y.o. male  DOB 1970/10/07  MRN [id number].  Admission date:  09/13/2016  Admitting Physician  Truett Mainland, DO  Discharge Date:  09/15/2016   Primary MD  Tawni Carnes, PA-C  Recommendations for primary care physician for things to follow:    1. Intractable nausea and vomitting secondary to DIabetic gastroparesis Started on reglan 5mg  po tid ac meals and qhs,  Cont compazine prn Increased protonix to 40mg  po bid Please f/u clinically  Hypokalemia repleted prior to discharge Check bmp in 1 week  Influenza A + (w slight chills) Treat with tamiflu 75mg  po bid x 3 days  Hypertension Hydralazine given prior to discharge Pt will check his bp at home, call pcp office if sbp >160 Restarted his regular bp medication Please check his bp at office visit and adjust medication as needed  Gerd Cont protonix  Dm2 Continue lantus 50 units [place] qhs  Hyperlipidemia Cont atorvastatin    Admission Diagnosis  Intractable cyclical vomiting with nausea [G43.A1]   Discharge Diagnosis  Intractable cyclical vomiting with nausea [G43.A1]    Principal Problem:   Intractable nausea and vomiting Active Problems:   Diabetes mellitus type 2 with complications, uncontrolled ([place])   Essential hypertension   Gastroparesis due to DM [facility])      Past Medical History:  Diagnosis Date  . Chronic abdominal pain   . Esophagitis   . Eye hemorrhage   . Gastritis   . Gastroparesis   . GERD (gastroesophageal reflux disease)   . Glaucoma   . Helicobacter pylori (H. pylori) infection   . Hiccups   . Hypertension   . Nausea and vomiting    chronic, recurrent  . Type 2 diabetes mellitus with complications (HCC)    diagnosed around age 30    Past Surgical History:  Procedure Laterality Date  . CATARACT EXTRACTION W/PHACO Left 05/19/2016   Procedure: CATARACT EXTRACTION PHACO  AND INTRAOCULAR LENS PLACEMENT LEFT EYE;  Surgeon: Tonny Branch, MD;  Location: [facility];  Service: Ophthalmology;  Laterality: Left;  CDE: 7.30  . CATARACT EXTRACTION W/PHACO Right 06/23/2016   Procedure: CATARACT EXTRACTION PHACO AND INTRAOCULAR LENS PLACEMENT RIGHT EYE CDE=9.87;  Surgeon: Tonny Branch, MD;  Location: [facility];  Service: Ophthalmology;  Laterality: Right;  right  . ESOPHAGOGASTRODUODENOSCOPY  2015   Dr. Britta Mccreedy  . ESOPHAGOGASTRODUODENOSCOPY N/A 10/26/2014   RMR: Distal esophagititis-likely reflux related although an element of pill induced injuruy no exclueded  status post biopsy. Diffusely abnormal gastric mucosa of uncertain signigicane -status post gastric biopsy. Focal area of excoriation in the cardia most consistant with a trauma of heaving.   [name] EYE SURGERY  2015   stent placed to left eye  . EYE SURGERY         HPI  from the history and physical done on the day of admission:      46 y.o.malewith a history of DM2 on insulin, gastroparesis, GERD, HTN, glaucoma. Patient was admitted for Flu and gastroparesis  from 1/26-1/28. Patient was discharged to home on tamiflu and tolerating an oral diet. Yesterday, patient had a salad and started to have vomiting. Vomited multiple times throughout night - had little sleep. Describes emesis as stomach contents. Regular antiemetics not helpful. Oral food and liquid makes symptoms worse. Has upper abdominal pain, no radiation.  Emergency Department Course: Xray and labs normal. Pt received IV reglan and benadryl. Failed PO challenge    Hospital Course:     Pt was started on reglan 10mg  iv q6h and his protonix was increased from once daily to bid.  Pt appeared to improved.  He was hypokalemic and this was repleted.  Pt tested + for flu so another couse of tamiflu was given.  Pt appears to be tolerating po intake yesterday and today and will be discharged to home.    Follow UP  Follow-up Information    SKILLMAN,KATIE, PA-C Follow up in  1 week(s).   Specialty:  Physician Assistant Contact information: [address] [id number]            Consults obtained - none  Discharge Condition: stable  Diet and Activity recommendation: See Discharge Instructions below  Discharge Instructions    Please follow up with your pcp in 1 week to f/u on gastroparesis as well as bp, and potassium      Discharge Medications     Allergies as of 09/15/2016      Reactions   Donnatal [pb-hyoscy-atropine-scopol Er] Anaphylaxis   Reaction to GI cocktail   Fish Allergy Anaphylaxis, Hives, Rash   Haldol [haloperidol Lactate] Other (See Comments)   Chest Pain   Lidocaine Anaphylaxis   Reaction to GI cocktail   Maalox [calcium Carbonate Antacid] Anaphylaxis   Reaction to GI cocktail   Aspirin Hives, Itching   Bactrim [sulfamethoxazole-trimethoprim] Itching   Doxycycline Hives, Itching   Penicillins Hives, Itching   Has patient had a PCN reaction causing immediate rash, facial/tongue/throat swelling, SOB or lightheadedness with hypotension: yes Has patient had a PCN reaction causing severe rash involving mucus membranes or skin necrosis: yes Has patient had a PCN reaction that required hospitalization no Has patient had a PCN reaction occurring within the last 10 years: yes If all of the above answers are "NO", then may proceed with Cephalospor   Phenergan [promethazine Hcl] Nausea And Vomiting   Zofran [ondansetron Hcl] Other (See Comments)   Hiccups      Medication List    TAKE these medications   amLODipine 5 MG tablet Commonly known as:  NORVASC Take 5 mg by mouth daily.   atorvastatin 10 MG tablet Commonly known as:  LIPITOR Take 10 mg by mouth daily.   brimonidine 0.15 % ophthalmic solution Commonly known as:  ALPHAGAN Place 1 drop into both eyes 3 (three) times daily.   cyclobenzaprine 10 MG tablet Commonly known as:  FLEXERIL Take 10 mg by mouth at bedtime.   dicyclomine 10 MG capsule Commonly  known as:  BENTYL Take 1 capsule (10 mg total) by mouth 3 (three) times daily as needed for spasms.   gabapentin 600 MG tablet Commonly known as:  NEURONTIN Take 600 mg by mouth 4 (four) times daily as needed (for neuropathy).   hydrochlorothiazide 25 MG tablet Commonly known as:  HYDRODIURIL Take 25 mg by mouth daily.   insulin NPH-regular Human (70-30) 100 UNIT/ML injection Commonly known as:  NOVOLIN 70/30 Inject 35 Units into the skin 2 (two) times daily.   lisinopril 40 MG  tablet Commonly known as:  PRINIVIL,ZESTRIL Take 40 mg by mouth daily.   metoCLOPramide 5 MG tablet Commonly known as:  REGLAN Take 1 tablet (5 mg total) by mouth 4 (four) times daily.   ONGLYZA 5 MG Tabs tablet Generic drug:  saxagliptin HCl Take 5 mg by mouth daily.   oseltamivir 75 MG capsule Commonly known as:  TAMIFLU Take 1 capsule (75 mg total) by mouth 2 (two) times daily.   pantoprazole 40 MG tablet Commonly known as:  PROTONIX Take 1 tablet (40 mg total) by mouth 2 (two) times daily before a meal. What changed:  when to take this   prochlorperazine 10 MG tablet Commonly known as:  COMPAZINE Take 1 tablet (10 mg total) by mouth every 6 (six) hours as needed for nausea or vomiting.   sucralfate 1 g tablet Commonly known as:  CARAFATE TAKE ONE TABLET BY MOUTH 4 TIMES DAILY What changed:  See the new instructions.   timolol 0.5 % ophthalmic solution Commonly known as:  BETIMOL Place 1 drop into both eyes 2 (two) times daily.       Major procedures and Radiology Reports - PLEASE review detailed and final reports for all details, in brief -      Dg Chest Port 1 View  Result Date: 09/05/2016 CLINICAL DATA:  46 year old male with fever and nausea and vomiting. EXAM: PORTABLE CHEST 1 VIEW COMPARISON:  Chest radiograph dated 03/18/2015 FINDINGS: There is shallow inspiration with minimal bibasilar atelectatic changes. There is no focal consolidation, pleural effusion, or pneumothorax.  The cardiac silhouette is within normal limits. No acute osseous pathology. IMPRESSION: No active disease. Electronically Signed   By: Anner Crete M.D.   On: 09/05/2016 23:52   Dg Abd Acute W/chest  Result Date: 09/13/2016 CLINICAL DATA:  Nausea and vomiting. History of gastritis and gastroparesis. EXAM: DG ABDOMEN ACUTE W/ 1V CHEST COMPARISON:  Chest radiograph - 09/05/2016 FINDINGS: Grossly unchanged cardiac silhouette and mediastinal contours given persistently reduced lung volumes. Overall improved aeration of lungs. No focal airspace opacities. No pleural effusion or pneumothorax. No evidence of edema. Moderate colonic stool burden without evidence of enteric obstruction. No pneumoperitoneum, pneumatosis or portal venous gas. No definitive abnormal intra-abdominal calcifications. No acute osseus abnormalities. Degenerative change of the lower lumbar spine. IMPRESSION: 1.  No acute cardiopulmonary disease. 2. Moderate colonic stool burden without evidence of enteric obstruction Electronically Signed   By: Sandi Mariscal M.D.   On: 09/13/2016 15:46    Micro Results     No results found for this or any previous visit (from the past 240 hour(s)).     Today   Subjective    Zaiden Ludlum today has no headache,no chest or abdominal pain, no n/v,no diarrhea, no new weakness tingling or numbness, feels much better wants to go home today.    Objective   Blood pressure (!) 171/85, pulse 78, temperature 97.7 F (36.5 C), temperature source Oral, resp. rate 20, height 6' (1.829 m), weight 128 kg (282 lb 3 oz), SpO2 99 %.   Intake/Output Summary (Last 24 hours) at 09/15/16 0927 Last data filed at 09/14/16 2324  Gross per 24 hour  Intake          1018.75 ml  Output             1750 ml  Net          -731.25 ml    Exam Awake Alert, Oriented x 3, No new F.N deficits, Normal affect [place].AT,PERRAL  Supple Neck,No JVD, No cervical lymphadenopathy appriciated.  Symmetrical Chest wall movement,  Good air movement bilaterally, CTAB RRR,No Gallops,Rubs or new Murmurs, No Parasternal Heave +ve B.Sounds, Abd Soft, Non tender, No organomegaly appriciated, No rebound -guarding or rigidity. No Cyanosis, Clubbing or edema, No new Rash or bruise   Data Review   CBC w Diff:  Lab Results  Component Value Date   WBC 7.3 09/15/2016   HGB 12.6 (L) 09/15/2016   HCT 35.9 (L) 09/15/2016   PLT 183 09/15/2016   LYMPHOPCT 14 09/13/2016   BANDSPCT PENDING 08/09/2015   MONOPCT 5 09/13/2016   EOSPCT 1 09/13/2016   BASOPCT 0 09/13/2016    CMP:  Lab Results  Component Value Date   NA 138 09/15/2016   K 3.1 (L) 09/15/2016   CL 106 09/15/2016   CO2 26 09/15/2016   BUN 8 09/15/2016   CREATININE 0.99 09/15/2016   PROT 6.0 (L) 09/15/2016   ALBUMIN 2.8 (L) 09/15/2016   BILITOT 0.7 09/15/2016   ALKPHOS 46 09/15/2016   AST 16 09/15/2016   ALT 14 (L) 09/15/2016  .   Total Time in preparing paper work, data evaluation and todays exam - 76 minutes  Jani Gravel M.D on 09/15/2016 at 9:27 AM  [facility]   Office  [phone]

## 2016-09-15 NOTE — Care Management Note (Signed)
Case Management Note  Patient Details  Name: GEORDIE NOONEY MRN: [id number] Date of Birth: June 14, 1971  Subjective/Objective:                  Pt admitted with flu and intractable N/V. Chart reviewed for CM needs. Pt is from home, lives with family. He is ind with ADL's. He has PCP, transportation and insurance with drug coverage.  Action/Plan: Pt plans to return home with self care. No CM needs anticipated.   Expected Discharge Date:     09/15/2016            Expected Discharge Plan:  Home/Self Care  In-House Referral:  NA  Discharge planning Services  CM Consult  Post Acute Care Choice:  NA Choice offered to:  NA  Status of Service:  Completed, signed off  Sherald Barge, RN 09/15/2016, 10:09 AM

## 2016-09-29 DIAGNOSIS — H26492 Other secondary cataract, left eye: Secondary | ICD-10-CM | POA: Diagnosis not present

## 2016-09-29 DIAGNOSIS — H401134 Primary open-angle glaucoma, bilateral, indeterminate stage: Secondary | ICD-10-CM | POA: Diagnosis not present

## 2016-09-29 DIAGNOSIS — Z961 Presence of intraocular lens: Secondary | ICD-10-CM | POA: Diagnosis not present

## 2016-09-29 DIAGNOSIS — H26491 Other secondary cataract, right eye: Secondary | ICD-10-CM | POA: Diagnosis not present

## 2016-10-07 DIAGNOSIS — R21 Rash and other nonspecific skin eruption: Secondary | ICD-10-CM | POA: Diagnosis not present

## 2016-10-07 DIAGNOSIS — Z6841 Body Mass Index (BMI) 40.0 and over, adult: Secondary | ICD-10-CM | POA: Diagnosis not present

## 2016-10-13 DIAGNOSIS — H4051X3 Glaucoma secondary to other eye disorders, right eye, severe stage: Secondary | ICD-10-CM | POA: Diagnosis not present

## 2016-10-13 DIAGNOSIS — E113553 Type 2 diabetes mellitus with stable proliferative diabetic retinopathy, bilateral: Secondary | ICD-10-CM | POA: Diagnosis not present

## 2016-10-13 DIAGNOSIS — H211X1 Other vascular disorders of iris and ciliary body, right eye: Secondary | ICD-10-CM | POA: Diagnosis not present

## 2016-10-13 DIAGNOSIS — H3589 Other specified retinal disorders: Secondary | ICD-10-CM | POA: Diagnosis not present

## 2016-10-13 DIAGNOSIS — H211X2 Other vascular disorders of iris and ciliary body, left eye: Secondary | ICD-10-CM | POA: Diagnosis not present

## 2016-10-20 ENCOUNTER — Ambulatory Visit: Payer: BLUE CROSS/BLUE SHIELD | Admitting: Nurse Practitioner

## 2016-10-22 ENCOUNTER — Encounter: Payer: Self-pay | Admitting: Nurse Practitioner

## 2016-10-22 ENCOUNTER — Ambulatory Visit (INDEPENDENT_AMBULATORY_CARE_PROVIDER_SITE_OTHER): Payer: Medicare Other | Admitting: Nurse Practitioner

## 2016-10-22 VITALS — BP 192/97 | HR 92 | Temp 98.2°F | Ht 72.0 in | Wt 298.2 lb

## 2016-10-22 DIAGNOSIS — K2970 Gastritis, unspecified, without bleeding: Secondary | ICD-10-CM

## 2016-10-22 DIAGNOSIS — K911 Postgastric surgery syndromes: Secondary | ICD-10-CM | POA: Diagnosis not present

## 2016-10-22 DIAGNOSIS — R112 Nausea with vomiting, unspecified: Secondary | ICD-10-CM

## 2016-10-22 NOTE — Assessment & Plan Note (Signed)
Symptoms currently well managed on current medications. Only recent episode of nausea or vomiting is when he had the flu. Continue to monitor. Return for follow-up in one year. Call back if any worsening symptoms.

## 2016-10-22 NOTE — Progress Notes (Signed)
Referring Provider: Tawni Carnes, PA-C Primary Care Physician:  Jeri Modena Primary GI:  Dr. Gala Romney  Chief Complaint  Patient presents with  . gastritis    f/u, doing ok    HPI:   Timothy Clark is a 46 y.o. male who presents for follow-up. The patient was last seen in our office 04/22/2016 at which point he stated he was doing well overall, nausea and vomiting for the first time in a long time earlier that morning thinks possibly due to blood sugar being a little elevated. Medication changes by [facility] helped significantly. No other GI symptoms. Taking erythromycin only as needed. Recommended continue current medications, follow up with PCP about diabetes control, follow up with [facility] has previous he recommended, return to our office in 6 months.  Today he states he's doing well overall. His BP is elevated today, BP at home has been 160s SBP. Recently saw PCP who changed his medications and told him to hold off for a couple weeks to allow changes to take effect. Denies dizziness, chest pain, shortness of breath currently. N/V doing well, only only recent episode was when he had the flu in January. Gastritis is doing well, states "I swear by that Carafate." Still on Protonix (PPI) twice daily. Has not had to take E-mycin in a while. Denies hematochezia, melena, fever, chills. Denies chest pain, dyspnea, dizziness, lightheadedness, syncope, near syncope. Denies any other upper or lower GI symptoms.  Past Medical History:  Diagnosis Date  . Chronic abdominal pain   . Esophagitis   . Eye hemorrhage   . Gastritis   . Gastroparesis   . GERD (gastroesophageal reflux disease)   . Glaucoma   . Helicobacter pylori (H. pylori) infection   . Hiccups   . Hypertension   . Nausea and vomiting    chronic, recurrent  . Type 2 diabetes mellitus with complications (HCC)    diagnosed around age 45    Past Surgical History:  Procedure Laterality Date  . CATARACT  EXTRACTION W/PHACO Left 05/19/2016   Procedure: CATARACT EXTRACTION PHACO AND INTRAOCULAR LENS PLACEMENT LEFT EYE;  Surgeon: Tonny Branch, MD;  Location: [facility];  Service: Ophthalmology;  Laterality: Left;  CDE: 7.30  . CATARACT EXTRACTION W/PHACO Right 06/23/2016   Procedure: CATARACT EXTRACTION PHACO AND INTRAOCULAR LENS PLACEMENT RIGHT EYE CDE=9.87;  Surgeon: Tonny Branch, MD;  Location: [facility];  Service: Ophthalmology;  Laterality: Right;  right  . ESOPHAGOGASTRODUODENOSCOPY  2015   Dr. Britta Mccreedy  . ESOPHAGOGASTRODUODENOSCOPY N/A 10/26/2014   RMR: Distal esophagititis-likely reflux related although an element of pill induced injuruy no exclueded  status post biopsy. Diffusely abnormal gastric mucosa of uncertain signigicane -status post gastric biopsy. Focal area of excoriation in the cardia most consistant with a trauma of heaving.   [name] EYE SURGERY  2015   stent placed to left eye  . EYE SURGERY      Current Outpatient Prescriptions  Medication Sig Dispense Refill  . amLODipine (NORVASC) 5 MG tablet Take 5 mg by mouth daily.    [name] atorvastatin (LIPITOR) 10 MG tablet Take 10 mg by mouth daily.    . brimonidine (ALPHAGAN) 0.15 % ophthalmic solution Place 1 drop into both eyes 3 (three) times daily.    . cyclobenzaprine (FLEXERIL) 10 MG tablet Take 10 mg by mouth at bedtime.     . dicyclomine (BENTYL) 10 MG capsule Take 1 capsule (10 mg total) by mouth 3 (three) times daily as needed for spasms.  30 capsule 0  . gabapentin (NEURONTIN) 600 MG tablet Take 600 mg by mouth 4 (four) times daily as needed (for neuropathy).     . hydrochlorothiazide (HYDRODIURIL) 25 MG tablet Take 25 mg by mouth daily.    . insulin NPH-regular Human (NOVOLIN 70/30) (70-30) 100 UNIT/ML injection Inject 35 Units into the skin 2 (two) times daily.    [name] lisinopril (PRINIVIL,ZESTRIL) 40 MG tablet Take 40 mg by mouth daily.    . pantoprazole (PROTONIX) 40 MG tablet Take 1 tablet (40 mg total) by mouth 2 (two) times daily before a  meal. 60 tablet 0  . prochlorperazine (COMPAZINE) 10 MG tablet Take 1 tablet (10 mg total) by mouth every 6 (six) hours as needed for nausea or vomiting. 30 tablet 0  . saxagliptin HCl (ONGLYZA) 5 MG TABS tablet Take 5 mg by mouth daily.    . sucralfate (CARAFATE) 1 g tablet TAKE ONE TABLET BY MOUTH 4 TIMES DAILY (Patient taking differently: Take 1 tablet by mouth twice daily) 120 tablet 0  . timolol (BETIMOL) 0.5 % ophthalmic solution Place 1 drop into both eyes 2 (two) times daily.     No current facility-administered medications for this visit.     Allergies as of 10/22/2016 - Review Complete 10/22/2016  Allergen Reaction Noted  . Donnatal [pb-hyoscy-atropine-scopol er] Anaphylaxis 07/12/2015  . Fish allergy Anaphylaxis, Hives, and Rash 01/14/2015  . Haldol [haloperidol lactate] Other (See Comments) 08/07/2015  . Lidocaine Anaphylaxis 07/12/2015  . Maalox [calcium carbonate antacid] Anaphylaxis 07/12/2015  . Aspirin Hives and Itching 03/22/2009  . Bactrim [sulfamethoxazole-trimethoprim] Itching 12/06/2014  . Doxycycline Hives and Itching 03/20/2014  . Penicillins Hives and Itching 03/22/2009  . Phenergan [promethazine hcl] Nausea And Vomiting 10/27/2014  . Zofran [ondansetron hcl] Other (See Comments) 10/27/2014    Family History  Problem Relation Age of Onset  . Ovarian cancer Mother   . Cervical cancer Sister   . Diabetes Sister   . Colon cancer Neg Hx     Social History   Social History  . Marital status: Single    Spouse name: N/A  . Number of children: N/A  . Years of education: N/A   Social History Main Topics  . Smoking status: Never Smoker  . Smokeless tobacco: Never Used  . Alcohol use No  . Drug use: No  . Sexual activity: Yes   Other Topics Concern  . None   Social History Narrative  . None    Review of Systems: General: Negative for anorexia, weight loss, fever, chills, fatigue, weakness. Eyes: Negative for vision changes.  ENT: Negative for  hoarseness, difficulty swallowing , nasal congestion. CV: Negative for chest pain, angina, palpitations, dyspnea on exertion, peripheral edema.  Respiratory: Negative for dyspnea at rest, dyspnea on exertion, cough, sputum, wheezing.  GI: See history of present illness. GU:  Negative for dysuria, hematuria, urinary incontinence, urinary frequency, nocturnal urination.  MS: Negative for joint pain, low back pain.  Derm: Negative for rash or itching.  Neuro: Negative for weakness, abnormal sensation, seizure, frequent headaches, memory loss, confusion.  Psych: Negative for anxiety, depression, suicidal ideation, hallucinations.  Endo: Negative for unusual weight change.  Heme: Negative for bruising or bleeding. Allergy: Negative for rash or hives.   Physical Exam: BP (!) 192/97   Pulse 92   Temp 98.2 F (36.8 C) (Oral)   Ht 6' (1.829 m)   Wt 298 lb 3.2 oz (135.3 kg)   BMI 40.44 kg/m  General:   Alert  and oriented. Pleasant and cooperative. Well-nourished and well-developed.  Head:  Normocephalic and atraumatic. Eyes:  Without icterus, sclera clear and conjunctiva pink.  Ears:  Normal auditory acuity. Mouth:  No deformity or lesions, oral mucosa pink.  Throat/Neck:  Supple, without mass or thyromegaly. Cardiovascular:  S1, S2 present without murmurs appreciated. Normal pulses noted. Extremities without clubbing or edema. Respiratory:  Clear to auscultation bilaterally. No wheezes, rales, or rhonchi. No distress.  Gastrointestinal:  +BS, soft, non-tender and non-distended. No HSM noted. No guarding or rebound. No masses appreciated.  Rectal:  Deferred  Musculoskalatal:  Symmetrical without gross deformities. Normal posture. Skin:  Intact without significant lesions or rashes. Neurologic:  Alert and oriented x4;  grossly normal neurologically. Psych:  Alert and cooperative. Normal mood and affect. Heme/Lymph/Immune: No significant cervical adenopathy. No excessive bruising  noted.    10/22/2016 3:49 PM   Disclaimer: This note was dictated with voice recognition software. Similar sounding words can inadvertently be transcribed and may not be corrected upon review.

## 2016-10-22 NOTE — Assessment & Plan Note (Signed)
No recurrence of dumping syndrome and quite some time. Continue current medications, continue to monitor for any recurrence. Call us and let us now if you have worsening or recurrent symptoms. Otherwise, return for follow-up in one year.

## 2016-10-22 NOTE — Patient Instructions (Signed)
1. Continue your current medications. 2. Return for follow-up in one year. 3. Call us if you have any worsening symptoms are returning symptoms before then.

## 2016-10-22 NOTE — Assessment & Plan Note (Signed)
Symptoms currently well-controlled on current medicines. Continue PPI and Carafate, which she feels makes a large difference in his gastritis symptoms. Continue to monitor. Return for follow-up in one year. Call back if any worsening symptoms.

## 2016-10-23 NOTE — Progress Notes (Signed)
cc'ed to pcp °

## 2016-11-04 ENCOUNTER — Telehealth: Payer: Self-pay | Admitting: Internal Medicine

## 2016-11-04 NOTE — Telephone Encounter (Signed)
PATIENT WOULD LIKE ERIC TO SEND HIM A PRESCRIPTION OF GENERIC [address] (2 DAILY) TO [facility] IN [place]

## 2016-11-04 NOTE — Telephone Encounter (Signed)
Routing to the refill box. 

## 2016-11-05 MED ORDER — PANTOPRAZOLE SODIUM 40 MG PO TBEC: 40.0000 mg | DELAYED_RELEASE_TABLET | Freq: Two times a day (BID) | ORAL | 0 refills | Status: DC

## 2016-11-05 NOTE — Telephone Encounter (Signed)
Please notify the patient the Rx was sent to the pharmacy as requested.

## 2016-11-05 NOTE — Addendum Note (Signed)
Addended by: Gordy Levan, Renee Erb A on: 11/05/2016 04:40 PM   Modules accepted: Orders

## 2016-11-05 NOTE — Telephone Encounter (Signed)
Pt aware.

## 2016-11-13 DIAGNOSIS — I10 Essential (primary) hypertension: Secondary | ICD-10-CM | POA: Diagnosis not present

## 2016-11-13 DIAGNOSIS — I739 Peripheral vascular disease, unspecified: Secondary | ICD-10-CM | POA: Diagnosis not present

## 2016-11-13 DIAGNOSIS — E13621 Other specified diabetes mellitus with foot ulcer: Secondary | ICD-10-CM | POA: Diagnosis not present

## 2016-11-17 DIAGNOSIS — E1143 Type 2 diabetes mellitus with diabetic autonomic (poly)neuropathy: Secondary | ICD-10-CM | POA: Diagnosis not present

## 2016-11-17 DIAGNOSIS — I739 Peripheral vascular disease, unspecified: Secondary | ICD-10-CM | POA: Diagnosis not present

## 2016-11-17 DIAGNOSIS — F419 Anxiety disorder, unspecified: Secondary | ICD-10-CM | POA: Diagnosis not present

## 2016-11-17 DIAGNOSIS — I10 Essential (primary) hypertension: Secondary | ICD-10-CM | POA: Diagnosis not present

## 2016-11-18 DIAGNOSIS — M869 Osteomyelitis, unspecified: Secondary | ICD-10-CM | POA: Diagnosis not present

## 2016-11-18 DIAGNOSIS — K3184 Gastroparesis: Secondary | ICD-10-CM | POA: Diagnosis present

## 2016-11-18 DIAGNOSIS — Z9119 Patient's noncompliance with other medical treatment and regimen: Secondary | ICD-10-CM | POA: Diagnosis not present

## 2016-11-18 DIAGNOSIS — Z79899 Other long term (current) drug therapy: Secondary | ICD-10-CM | POA: Diagnosis not present

## 2016-11-18 DIAGNOSIS — E1169 Type 2 diabetes mellitus with other specified complication: Secondary | ICD-10-CM | POA: Diagnosis present

## 2016-11-18 DIAGNOSIS — E1143 Type 2 diabetes mellitus with diabetic autonomic (poly)neuropathy: Secondary | ICD-10-CM | POA: Diagnosis present

## 2016-11-18 DIAGNOSIS — I10 Essential (primary) hypertension: Secondary | ICD-10-CM | POA: Diagnosis not present

## 2016-11-18 DIAGNOSIS — Z452 Encounter for adjustment and management of vascular access device: Secondary | ICD-10-CM | POA: Diagnosis not present

## 2016-11-18 DIAGNOSIS — E11319 Type 2 diabetes mellitus with unspecified diabetic retinopathy without macular edema: Secondary | ICD-10-CM | POA: Diagnosis present

## 2016-11-18 DIAGNOSIS — Z794 Long term (current) use of insulin: Secondary | ICD-10-CM | POA: Diagnosis not present

## 2016-11-18 DIAGNOSIS — Z833 Family history of diabetes mellitus: Secondary | ICD-10-CM | POA: Diagnosis not present

## 2016-11-18 DIAGNOSIS — E1110 Type 2 diabetes mellitus with ketoacidosis without coma: Secondary | ICD-10-CM | POA: Diagnosis not present

## 2016-11-18 DIAGNOSIS — E876 Hypokalemia: Secondary | ICD-10-CM | POA: Diagnosis present

## 2016-11-18 DIAGNOSIS — Z888 Allergy status to other drugs, medicaments and biological substances status: Secondary | ICD-10-CM | POA: Diagnosis not present

## 2016-11-18 DIAGNOSIS — Z881 Allergy status to other antibiotic agents status: Secondary | ICD-10-CM | POA: Diagnosis not present

## 2016-11-18 DIAGNOSIS — E11621 Type 2 diabetes mellitus with foot ulcer: Secondary | ICD-10-CM | POA: Diagnosis not present

## 2016-11-18 DIAGNOSIS — Z886 Allergy status to analgesic agent status: Secondary | ICD-10-CM | POA: Diagnosis not present

## 2016-11-18 DIAGNOSIS — Z91018 Allergy to other foods: Secondary | ICD-10-CM | POA: Diagnosis not present

## 2016-11-18 DIAGNOSIS — L97519 Non-pressure chronic ulcer of other part of right foot with unspecified severity: Secondary | ICD-10-CM | POA: Diagnosis present

## 2016-11-18 DIAGNOSIS — M216X1 Other acquired deformities of right foot: Secondary | ICD-10-CM | POA: Diagnosis not present

## 2016-11-18 DIAGNOSIS — Z880 Allergy status to penicillin: Secondary | ICD-10-CM | POA: Diagnosis not present

## 2016-11-18 DIAGNOSIS — S91301A Unspecified open wound, right foot, initial encounter: Secondary | ICD-10-CM | POA: Diagnosis not present

## 2016-11-18 DIAGNOSIS — S86291A Other injury of muscle(s) and tendon(s) of anterior muscle group at lower leg level, right leg, initial encounter: Secondary | ICD-10-CM | POA: Diagnosis not present

## 2016-11-18 DIAGNOSIS — R109 Unspecified abdominal pain: Secondary | ICD-10-CM | POA: Diagnosis not present

## 2016-11-18 DIAGNOSIS — S91011A Laceration without foreign body, right ankle, initial encounter: Secondary | ICD-10-CM | POA: Diagnosis not present

## 2016-11-18 DIAGNOSIS — K219 Gastro-esophageal reflux disease without esophagitis: Secondary | ICD-10-CM | POA: Diagnosis present

## 2016-11-18 DIAGNOSIS — M7989 Other specified soft tissue disorders: Secondary | ICD-10-CM | POA: Diagnosis not present

## 2016-11-18 DIAGNOSIS — M86171 Other acute osteomyelitis, right ankle and foot: Secondary | ICD-10-CM | POA: Diagnosis not present

## 2016-11-18 DIAGNOSIS — E1310 Other specified diabetes mellitus with ketoacidosis without coma: Secondary | ICD-10-CM | POA: Diagnosis not present

## 2016-11-26 DIAGNOSIS — E1169 Type 2 diabetes mellitus with other specified complication: Secondary | ICD-10-CM | POA: Diagnosis not present

## 2016-11-26 DIAGNOSIS — L97519 Non-pressure chronic ulcer of other part of right foot with unspecified severity: Secondary | ICD-10-CM | POA: Diagnosis not present

## 2016-11-26 DIAGNOSIS — Z794 Long term (current) use of insulin: Secondary | ICD-10-CM | POA: Diagnosis not present

## 2016-11-26 DIAGNOSIS — E113599 Type 2 diabetes mellitus with proliferative diabetic retinopathy without macular edema, unspecified eye: Secondary | ICD-10-CM | POA: Diagnosis not present

## 2016-11-26 DIAGNOSIS — I10 Essential (primary) hypertension: Secondary | ICD-10-CM | POA: Diagnosis not present

## 2016-11-26 DIAGNOSIS — K219 Gastro-esophageal reflux disease without esophagitis: Secondary | ICD-10-CM | POA: Diagnosis not present

## 2016-11-26 DIAGNOSIS — E11621 Type 2 diabetes mellitus with foot ulcer: Secondary | ICD-10-CM | POA: Diagnosis not present

## 2016-11-26 DIAGNOSIS — Z452 Encounter for adjustment and management of vascular access device: Secondary | ICD-10-CM | POA: Diagnosis not present

## 2016-11-26 DIAGNOSIS — K3184 Gastroparesis: Secondary | ICD-10-CM | POA: Diagnosis not present

## 2016-11-26 DIAGNOSIS — E1165 Type 2 diabetes mellitus with hyperglycemia: Secondary | ICD-10-CM | POA: Diagnosis not present

## 2016-11-26 DIAGNOSIS — E1143 Type 2 diabetes mellitus with diabetic autonomic (poly)neuropathy: Secondary | ICD-10-CM | POA: Diagnosis not present

## 2016-11-26 DIAGNOSIS — M86171 Other acute osteomyelitis, right ankle and foot: Secondary | ICD-10-CM | POA: Diagnosis not present

## 2016-11-27 DIAGNOSIS — Z886 Allergy status to analgesic agent status: Secondary | ICD-10-CM | POA: Diagnosis not present

## 2016-11-27 DIAGNOSIS — Z882 Allergy status to sulfonamides status: Secondary | ICD-10-CM | POA: Diagnosis not present

## 2016-11-27 DIAGNOSIS — L97519 Non-pressure chronic ulcer of other part of right foot with unspecified severity: Secondary | ICD-10-CM | POA: Diagnosis not present

## 2016-11-27 DIAGNOSIS — Z79899 Other long term (current) drug therapy: Secondary | ICD-10-CM | POA: Diagnosis not present

## 2016-11-27 DIAGNOSIS — M86171 Other acute osteomyelitis, right ankle and foot: Secondary | ICD-10-CM | POA: Diagnosis not present

## 2016-11-27 DIAGNOSIS — E1140 Type 2 diabetes mellitus with diabetic neuropathy, unspecified: Secondary | ICD-10-CM | POA: Diagnosis not present

## 2016-11-27 DIAGNOSIS — K3184 Gastroparesis: Secondary | ICD-10-CM | POA: Diagnosis not present

## 2016-11-27 DIAGNOSIS — H5440 Blindness, one eye, unspecified eye: Secondary | ICD-10-CM | POA: Diagnosis not present

## 2016-11-27 DIAGNOSIS — I10 Essential (primary) hypertension: Secondary | ICD-10-CM | POA: Diagnosis not present

## 2016-11-27 DIAGNOSIS — Z888 Allergy status to other drugs, medicaments and biological substances status: Secondary | ICD-10-CM | POA: Diagnosis not present

## 2016-11-27 DIAGNOSIS — Z881 Allergy status to other antibiotic agents status: Secondary | ICD-10-CM | POA: Diagnosis not present

## 2016-11-27 DIAGNOSIS — Z880 Allergy status to penicillin: Secondary | ICD-10-CM | POA: Diagnosis not present

## 2016-11-27 DIAGNOSIS — E1169 Type 2 diabetes mellitus with other specified complication: Secondary | ICD-10-CM | POA: Diagnosis not present

## 2016-11-27 DIAGNOSIS — Z794 Long term (current) use of insulin: Secondary | ICD-10-CM | POA: Diagnosis not present

## 2016-11-27 DIAGNOSIS — E1165 Type 2 diabetes mellitus with hyperglycemia: Secondary | ICD-10-CM | POA: Diagnosis not present

## 2016-11-27 DIAGNOSIS — E1143 Type 2 diabetes mellitus with diabetic autonomic (poly)neuropathy: Secondary | ICD-10-CM | POA: Diagnosis not present

## 2016-11-27 DIAGNOSIS — E7800 Pure hypercholesterolemia, unspecified: Secondary | ICD-10-CM | POA: Diagnosis not present

## 2016-11-27 DIAGNOSIS — L97513 Non-pressure chronic ulcer of other part of right foot with necrosis of muscle: Secondary | ICD-10-CM | POA: Diagnosis not present

## 2016-11-27 DIAGNOSIS — K219 Gastro-esophageal reflux disease without esophagitis: Secondary | ICD-10-CM | POA: Diagnosis not present

## 2016-11-27 DIAGNOSIS — E11621 Type 2 diabetes mellitus with foot ulcer: Secondary | ICD-10-CM | POA: Diagnosis not present

## 2016-11-27 DIAGNOSIS — Z452 Encounter for adjustment and management of vascular access device: Secondary | ICD-10-CM | POA: Diagnosis not present

## 2016-11-28 DIAGNOSIS — L97519 Non-pressure chronic ulcer of other part of right foot with unspecified severity: Secondary | ICD-10-CM | POA: Diagnosis not present

## 2016-11-28 DIAGNOSIS — Z452 Encounter for adjustment and management of vascular access device: Secondary | ICD-10-CM | POA: Diagnosis not present

## 2016-11-28 DIAGNOSIS — E1165 Type 2 diabetes mellitus with hyperglycemia: Secondary | ICD-10-CM | POA: Diagnosis not present

## 2016-11-28 DIAGNOSIS — E1169 Type 2 diabetes mellitus with other specified complication: Secondary | ICD-10-CM | POA: Diagnosis not present

## 2016-11-28 DIAGNOSIS — M86171 Other acute osteomyelitis, right ankle and foot: Secondary | ICD-10-CM | POA: Diagnosis not present

## 2016-11-28 DIAGNOSIS — E11621 Type 2 diabetes mellitus with foot ulcer: Secondary | ICD-10-CM | POA: Diagnosis not present

## 2016-11-29 DIAGNOSIS — Z452 Encounter for adjustment and management of vascular access device: Secondary | ICD-10-CM | POA: Diagnosis not present

## 2016-11-29 DIAGNOSIS — M86171 Other acute osteomyelitis, right ankle and foot: Secondary | ICD-10-CM | POA: Diagnosis not present

## 2016-11-29 DIAGNOSIS — L97519 Non-pressure chronic ulcer of other part of right foot with unspecified severity: Secondary | ICD-10-CM | POA: Diagnosis not present

## 2016-11-29 DIAGNOSIS — E1169 Type 2 diabetes mellitus with other specified complication: Secondary | ICD-10-CM | POA: Diagnosis not present

## 2016-11-29 DIAGNOSIS — E11621 Type 2 diabetes mellitus with foot ulcer: Secondary | ICD-10-CM | POA: Diagnosis not present

## 2016-11-29 DIAGNOSIS — E1165 Type 2 diabetes mellitus with hyperglycemia: Secondary | ICD-10-CM | POA: Diagnosis not present

## 2016-11-30 DIAGNOSIS — M86171 Other acute osteomyelitis, right ankle and foot: Secondary | ICD-10-CM | POA: Diagnosis not present

## 2016-11-30 DIAGNOSIS — E1165 Type 2 diabetes mellitus with hyperglycemia: Secondary | ICD-10-CM | POA: Diagnosis not present

## 2016-11-30 DIAGNOSIS — E1169 Type 2 diabetes mellitus with other specified complication: Secondary | ICD-10-CM | POA: Diagnosis not present

## 2016-11-30 DIAGNOSIS — E11621 Type 2 diabetes mellitus with foot ulcer: Secondary | ICD-10-CM | POA: Diagnosis not present

## 2016-11-30 DIAGNOSIS — Z452 Encounter for adjustment and management of vascular access device: Secondary | ICD-10-CM | POA: Diagnosis not present

## 2016-11-30 DIAGNOSIS — L97519 Non-pressure chronic ulcer of other part of right foot with unspecified severity: Secondary | ICD-10-CM | POA: Diagnosis not present

## 2016-12-01 DIAGNOSIS — E11621 Type 2 diabetes mellitus with foot ulcer: Secondary | ICD-10-CM | POA: Diagnosis not present

## 2016-12-01 DIAGNOSIS — K219 Gastro-esophageal reflux disease without esophagitis: Secondary | ICD-10-CM | POA: Diagnosis not present

## 2016-12-01 DIAGNOSIS — I10 Essential (primary) hypertension: Secondary | ICD-10-CM | POA: Diagnosis not present

## 2016-12-01 DIAGNOSIS — E1169 Type 2 diabetes mellitus with other specified complication: Secondary | ICD-10-CM | POA: Diagnosis not present

## 2016-12-01 DIAGNOSIS — E119 Type 2 diabetes mellitus without complications: Secondary | ICD-10-CM | POA: Diagnosis not present

## 2016-12-01 DIAGNOSIS — M86171 Other acute osteomyelitis, right ankle and foot: Secondary | ICD-10-CM | POA: Diagnosis not present

## 2016-12-01 DIAGNOSIS — E1165 Type 2 diabetes mellitus with hyperglycemia: Secondary | ICD-10-CM | POA: Diagnosis not present

## 2016-12-01 DIAGNOSIS — M869 Osteomyelitis, unspecified: Secondary | ICD-10-CM | POA: Diagnosis not present

## 2016-12-01 DIAGNOSIS — E1140 Type 2 diabetes mellitus with diabetic neuropathy, unspecified: Secondary | ICD-10-CM | POA: Diagnosis not present

## 2016-12-01 DIAGNOSIS — E7800 Pure hypercholesterolemia, unspecified: Secondary | ICD-10-CM | POA: Diagnosis not present

## 2016-12-01 DIAGNOSIS — Z452 Encounter for adjustment and management of vascular access device: Secondary | ICD-10-CM | POA: Diagnosis not present

## 2016-12-01 DIAGNOSIS — L97519 Non-pressure chronic ulcer of other part of right foot with unspecified severity: Secondary | ICD-10-CM | POA: Diagnosis not present

## 2016-12-02 DIAGNOSIS — E11621 Type 2 diabetes mellitus with foot ulcer: Secondary | ICD-10-CM | POA: Diagnosis not present

## 2016-12-02 DIAGNOSIS — I10 Essential (primary) hypertension: Secondary | ICD-10-CM | POA: Diagnosis not present

## 2016-12-02 DIAGNOSIS — E1169 Type 2 diabetes mellitus with other specified complication: Secondary | ICD-10-CM | POA: Diagnosis not present

## 2016-12-02 DIAGNOSIS — E7800 Pure hypercholesterolemia, unspecified: Secondary | ICD-10-CM | POA: Diagnosis not present

## 2016-12-02 DIAGNOSIS — Z452 Encounter for adjustment and management of vascular access device: Secondary | ICD-10-CM | POA: Diagnosis not present

## 2016-12-02 DIAGNOSIS — L97519 Non-pressure chronic ulcer of other part of right foot with unspecified severity: Secondary | ICD-10-CM | POA: Diagnosis not present

## 2016-12-02 DIAGNOSIS — E1140 Type 2 diabetes mellitus with diabetic neuropathy, unspecified: Secondary | ICD-10-CM | POA: Diagnosis not present

## 2016-12-02 DIAGNOSIS — I739 Peripheral vascular disease, unspecified: Secondary | ICD-10-CM | POA: Diagnosis not present

## 2016-12-02 DIAGNOSIS — E1165 Type 2 diabetes mellitus with hyperglycemia: Secondary | ICD-10-CM | POA: Diagnosis not present

## 2016-12-02 DIAGNOSIS — M86171 Other acute osteomyelitis, right ankle and foot: Secondary | ICD-10-CM | POA: Diagnosis not present

## 2016-12-02 DIAGNOSIS — K219 Gastro-esophageal reflux disease without esophagitis: Secondary | ICD-10-CM | POA: Diagnosis not present

## 2016-12-03 DIAGNOSIS — E11621 Type 2 diabetes mellitus with foot ulcer: Secondary | ICD-10-CM | POA: Diagnosis not present

## 2016-12-03 DIAGNOSIS — Z452 Encounter for adjustment and management of vascular access device: Secondary | ICD-10-CM | POA: Diagnosis not present

## 2016-12-03 DIAGNOSIS — E1169 Type 2 diabetes mellitus with other specified complication: Secondary | ICD-10-CM | POA: Diagnosis not present

## 2016-12-03 DIAGNOSIS — L97519 Non-pressure chronic ulcer of other part of right foot with unspecified severity: Secondary | ICD-10-CM | POA: Diagnosis not present

## 2016-12-03 DIAGNOSIS — M86171 Other acute osteomyelitis, right ankle and foot: Secondary | ICD-10-CM | POA: Diagnosis not present

## 2016-12-03 DIAGNOSIS — E1165 Type 2 diabetes mellitus with hyperglycemia: Secondary | ICD-10-CM | POA: Diagnosis not present

## 2016-12-04 DIAGNOSIS — E1169 Type 2 diabetes mellitus with other specified complication: Secondary | ICD-10-CM | POA: Diagnosis not present

## 2016-12-04 DIAGNOSIS — E7800 Pure hypercholesterolemia, unspecified: Secondary | ICD-10-CM | POA: Diagnosis not present

## 2016-12-04 DIAGNOSIS — M86171 Other acute osteomyelitis, right ankle and foot: Secondary | ICD-10-CM | POA: Diagnosis not present

## 2016-12-04 DIAGNOSIS — E1165 Type 2 diabetes mellitus with hyperglycemia: Secondary | ICD-10-CM | POA: Diagnosis not present

## 2016-12-04 DIAGNOSIS — L97513 Non-pressure chronic ulcer of other part of right foot with necrosis of muscle: Secondary | ICD-10-CM | POA: Diagnosis not present

## 2016-12-04 DIAGNOSIS — L97519 Non-pressure chronic ulcer of other part of right foot with unspecified severity: Secondary | ICD-10-CM | POA: Diagnosis not present

## 2016-12-04 DIAGNOSIS — Z452 Encounter for adjustment and management of vascular access device: Secondary | ICD-10-CM | POA: Diagnosis not present

## 2016-12-04 DIAGNOSIS — I10 Essential (primary) hypertension: Secondary | ICD-10-CM | POA: Diagnosis not present

## 2016-12-04 DIAGNOSIS — E1140 Type 2 diabetes mellitus with diabetic neuropathy, unspecified: Secondary | ICD-10-CM | POA: Diagnosis not present

## 2016-12-04 DIAGNOSIS — K219 Gastro-esophageal reflux disease without esophagitis: Secondary | ICD-10-CM | POA: Diagnosis not present

## 2016-12-04 DIAGNOSIS — E11621 Type 2 diabetes mellitus with foot ulcer: Secondary | ICD-10-CM | POA: Diagnosis not present

## 2016-12-05 DIAGNOSIS — E1165 Type 2 diabetes mellitus with hyperglycemia: Secondary | ICD-10-CM | POA: Diagnosis not present

## 2016-12-05 DIAGNOSIS — E1169 Type 2 diabetes mellitus with other specified complication: Secondary | ICD-10-CM | POA: Diagnosis not present

## 2016-12-05 DIAGNOSIS — E11621 Type 2 diabetes mellitus with foot ulcer: Secondary | ICD-10-CM | POA: Diagnosis not present

## 2016-12-05 DIAGNOSIS — Z452 Encounter for adjustment and management of vascular access device: Secondary | ICD-10-CM | POA: Diagnosis not present

## 2016-12-05 DIAGNOSIS — L97519 Non-pressure chronic ulcer of other part of right foot with unspecified severity: Secondary | ICD-10-CM | POA: Diagnosis not present

## 2016-12-05 DIAGNOSIS — M86171 Other acute osteomyelitis, right ankle and foot: Secondary | ICD-10-CM | POA: Diagnosis not present

## 2016-12-08 DIAGNOSIS — E11621 Type 2 diabetes mellitus with foot ulcer: Secondary | ICD-10-CM | POA: Diagnosis not present

## 2016-12-08 DIAGNOSIS — L97519 Non-pressure chronic ulcer of other part of right foot with unspecified severity: Secondary | ICD-10-CM | POA: Diagnosis not present

## 2016-12-08 DIAGNOSIS — E1169 Type 2 diabetes mellitus with other specified complication: Secondary | ICD-10-CM | POA: Diagnosis not present

## 2016-12-08 DIAGNOSIS — Z452 Encounter for adjustment and management of vascular access device: Secondary | ICD-10-CM | POA: Diagnosis not present

## 2016-12-08 DIAGNOSIS — E1165 Type 2 diabetes mellitus with hyperglycemia: Secondary | ICD-10-CM | POA: Diagnosis not present

## 2016-12-08 DIAGNOSIS — M86171 Other acute osteomyelitis, right ankle and foot: Secondary | ICD-10-CM | POA: Diagnosis not present

## 2016-12-09 DIAGNOSIS — E1169 Type 2 diabetes mellitus with other specified complication: Secondary | ICD-10-CM | POA: Diagnosis not present

## 2016-12-09 DIAGNOSIS — M86171 Other acute osteomyelitis, right ankle and foot: Secondary | ICD-10-CM | POA: Diagnosis not present

## 2016-12-09 DIAGNOSIS — E1165 Type 2 diabetes mellitus with hyperglycemia: Secondary | ICD-10-CM | POA: Diagnosis not present

## 2016-12-09 DIAGNOSIS — L97519 Non-pressure chronic ulcer of other part of right foot with unspecified severity: Secondary | ICD-10-CM | POA: Diagnosis not present

## 2016-12-09 DIAGNOSIS — Z452 Encounter for adjustment and management of vascular access device: Secondary | ICD-10-CM | POA: Diagnosis not present

## 2016-12-09 DIAGNOSIS — E11621 Type 2 diabetes mellitus with foot ulcer: Secondary | ICD-10-CM | POA: Diagnosis not present

## 2016-12-11 ENCOUNTER — Other Ambulatory Visit: Payer: Self-pay | Admitting: Vascular Surgery

## 2016-12-11 DIAGNOSIS — E11621 Type 2 diabetes mellitus with foot ulcer: Secondary | ICD-10-CM | POA: Diagnosis not present

## 2016-12-11 DIAGNOSIS — I739 Peripheral vascular disease, unspecified: Secondary | ICD-10-CM

## 2016-12-11 DIAGNOSIS — E1169 Type 2 diabetes mellitus with other specified complication: Secondary | ICD-10-CM | POA: Diagnosis not present

## 2016-12-11 DIAGNOSIS — Z452 Encounter for adjustment and management of vascular access device: Secondary | ICD-10-CM | POA: Diagnosis not present

## 2016-12-11 DIAGNOSIS — L97519 Non-pressure chronic ulcer of other part of right foot with unspecified severity: Secondary | ICD-10-CM | POA: Diagnosis not present

## 2016-12-11 DIAGNOSIS — E1165 Type 2 diabetes mellitus with hyperglycemia: Secondary | ICD-10-CM | POA: Diagnosis not present

## 2016-12-11 DIAGNOSIS — M86171 Other acute osteomyelitis, right ankle and foot: Secondary | ICD-10-CM | POA: Diagnosis not present

## 2016-12-15 ENCOUNTER — Other Ambulatory Visit: Payer: Self-pay | Admitting: Nurse Practitioner

## 2016-12-15 DIAGNOSIS — E113553 Type 2 diabetes mellitus with stable proliferative diabetic retinopathy, bilateral: Secondary | ICD-10-CM | POA: Diagnosis not present

## 2016-12-15 DIAGNOSIS — H4051X3 Glaucoma secondary to other eye disorders, right eye, severe stage: Secondary | ICD-10-CM | POA: Diagnosis not present

## 2016-12-15 DIAGNOSIS — E11621 Type 2 diabetes mellitus with foot ulcer: Secondary | ICD-10-CM | POA: Diagnosis not present

## 2016-12-15 DIAGNOSIS — H4052X4 Glaucoma secondary to other eye disorders, left eye, indeterminate stage: Secondary | ICD-10-CM | POA: Diagnosis not present

## 2016-12-15 DIAGNOSIS — E1169 Type 2 diabetes mellitus with other specified complication: Secondary | ICD-10-CM | POA: Diagnosis not present

## 2016-12-15 DIAGNOSIS — E1165 Type 2 diabetes mellitus with hyperglycemia: Secondary | ICD-10-CM | POA: Diagnosis not present

## 2016-12-15 DIAGNOSIS — H211X1 Other vascular disorders of iris and ciliary body, right eye: Secondary | ICD-10-CM | POA: Diagnosis not present

## 2016-12-15 DIAGNOSIS — L97519 Non-pressure chronic ulcer of other part of right foot with unspecified severity: Secondary | ICD-10-CM | POA: Diagnosis not present

## 2016-12-15 DIAGNOSIS — H4720 Unspecified optic atrophy: Secondary | ICD-10-CM | POA: Diagnosis not present

## 2016-12-15 DIAGNOSIS — H35041 Retinal micro-aneurysms, unspecified, right eye: Secondary | ICD-10-CM | POA: Diagnosis not present

## 2016-12-15 DIAGNOSIS — Z452 Encounter for adjustment and management of vascular access device: Secondary | ICD-10-CM | POA: Diagnosis not present

## 2016-12-15 DIAGNOSIS — M86171 Other acute osteomyelitis, right ankle and foot: Secondary | ICD-10-CM | POA: Diagnosis not present

## 2016-12-16 DIAGNOSIS — L97519 Non-pressure chronic ulcer of other part of right foot with unspecified severity: Secondary | ICD-10-CM | POA: Diagnosis not present

## 2016-12-16 DIAGNOSIS — M86171 Other acute osteomyelitis, right ankle and foot: Secondary | ICD-10-CM | POA: Diagnosis not present

## 2016-12-16 DIAGNOSIS — E1169 Type 2 diabetes mellitus with other specified complication: Secondary | ICD-10-CM | POA: Diagnosis not present

## 2016-12-16 DIAGNOSIS — Z452 Encounter for adjustment and management of vascular access device: Secondary | ICD-10-CM | POA: Diagnosis not present

## 2016-12-16 DIAGNOSIS — E11621 Type 2 diabetes mellitus with foot ulcer: Secondary | ICD-10-CM | POA: Diagnosis not present

## 2016-12-16 DIAGNOSIS — E1165 Type 2 diabetes mellitus with hyperglycemia: Secondary | ICD-10-CM | POA: Diagnosis not present

## 2016-12-18 DIAGNOSIS — E11621 Type 2 diabetes mellitus with foot ulcer: Secondary | ICD-10-CM | POA: Diagnosis not present

## 2016-12-18 DIAGNOSIS — L97519 Non-pressure chronic ulcer of other part of right foot with unspecified severity: Secondary | ICD-10-CM | POA: Diagnosis not present

## 2016-12-18 DIAGNOSIS — L97513 Non-pressure chronic ulcer of other part of right foot with necrosis of muscle: Secondary | ICD-10-CM | POA: Diagnosis not present

## 2016-12-19 DIAGNOSIS — E1142 Type 2 diabetes mellitus with diabetic polyneuropathy: Secondary | ICD-10-CM | POA: Diagnosis not present

## 2016-12-19 DIAGNOSIS — E11621 Type 2 diabetes mellitus with foot ulcer: Secondary | ICD-10-CM | POA: Diagnosis not present

## 2016-12-19 DIAGNOSIS — Z794 Long term (current) use of insulin: Secondary | ICD-10-CM | POA: Diagnosis not present

## 2016-12-19 DIAGNOSIS — Z880 Allergy status to penicillin: Secondary | ICD-10-CM | POA: Diagnosis not present

## 2016-12-19 DIAGNOSIS — E1165 Type 2 diabetes mellitus with hyperglycemia: Secondary | ICD-10-CM | POA: Diagnosis not present

## 2016-12-19 DIAGNOSIS — E11319 Type 2 diabetes mellitus with unspecified diabetic retinopathy without macular edema: Secondary | ICD-10-CM | POA: Diagnosis not present

## 2016-12-19 DIAGNOSIS — R112 Nausea with vomiting, unspecified: Secondary | ICD-10-CM | POA: Diagnosis not present

## 2016-12-19 DIAGNOSIS — R1110 Vomiting, unspecified: Secondary | ICD-10-CM | POA: Diagnosis not present

## 2016-12-19 DIAGNOSIS — Z886 Allergy status to analgesic agent status: Secondary | ICD-10-CM | POA: Diagnosis not present

## 2016-12-19 DIAGNOSIS — Z883 Allergy status to other anti-infective agents status: Secondary | ICD-10-CM | POA: Diagnosis not present

## 2016-12-19 DIAGNOSIS — I10 Essential (primary) hypertension: Secondary | ICD-10-CM | POA: Diagnosis not present

## 2016-12-19 DIAGNOSIS — R6881 Early satiety: Secondary | ICD-10-CM | POA: Diagnosis not present

## 2016-12-19 DIAGNOSIS — E1143 Type 2 diabetes mellitus with diabetic autonomic (poly)neuropathy: Secondary | ICD-10-CM | POA: Diagnosis not present

## 2016-12-19 DIAGNOSIS — R109 Unspecified abdominal pain: Secondary | ICD-10-CM | POA: Diagnosis not present

## 2016-12-19 DIAGNOSIS — L97519 Non-pressure chronic ulcer of other part of right foot with unspecified severity: Secondary | ICD-10-CM | POA: Diagnosis not present

## 2016-12-19 DIAGNOSIS — Z888 Allergy status to other drugs, medicaments and biological substances status: Secondary | ICD-10-CM | POA: Diagnosis not present

## 2016-12-19 DIAGNOSIS — Z79899 Other long term (current) drug therapy: Secondary | ICD-10-CM | POA: Diagnosis not present

## 2016-12-19 DIAGNOSIS — K3184 Gastroparesis: Secondary | ICD-10-CM | POA: Diagnosis not present

## 2016-12-19 DIAGNOSIS — Z6841 Body Mass Index (BMI) 40.0 and over, adult: Secondary | ICD-10-CM | POA: Diagnosis not present

## 2016-12-19 DIAGNOSIS — K219 Gastro-esophageal reflux disease without esophagitis: Secondary | ICD-10-CM | POA: Diagnosis not present

## 2016-12-20 DIAGNOSIS — E1165 Type 2 diabetes mellitus with hyperglycemia: Secondary | ICD-10-CM | POA: Diagnosis not present

## 2016-12-22 ENCOUNTER — Encounter: Payer: Self-pay | Admitting: Vascular Surgery

## 2016-12-22 ENCOUNTER — Telehealth: Payer: Self-pay

## 2016-12-22 DIAGNOSIS — E1165 Type 2 diabetes mellitus with hyperglycemia: Secondary | ICD-10-CM | POA: Diagnosis not present

## 2016-12-22 DIAGNOSIS — R0602 Shortness of breath: Secondary | ICD-10-CM | POA: Diagnosis not present

## 2016-12-22 DIAGNOSIS — E1121 Type 2 diabetes mellitus with diabetic nephropathy: Secondary | ICD-10-CM | POA: Diagnosis not present

## 2016-12-22 DIAGNOSIS — E1143 Type 2 diabetes mellitus with diabetic autonomic (poly)neuropathy: Secondary | ICD-10-CM | POA: Diagnosis not present

## 2016-12-22 DIAGNOSIS — K3184 Gastroparesis: Secondary | ICD-10-CM | POA: Diagnosis not present

## 2016-12-22 DIAGNOSIS — Z888 Allergy status to other drugs, medicaments and biological substances status: Secondary | ICD-10-CM | POA: Diagnosis not present

## 2016-12-22 DIAGNOSIS — I10 Essential (primary) hypertension: Secondary | ICD-10-CM | POA: Diagnosis not present

## 2016-12-22 DIAGNOSIS — R112 Nausea with vomiting, unspecified: Secondary | ICD-10-CM | POA: Diagnosis not present

## 2016-12-22 DIAGNOSIS — M86171 Other acute osteomyelitis, right ankle and foot: Secondary | ICD-10-CM | POA: Diagnosis not present

## 2016-12-22 DIAGNOSIS — I739 Peripheral vascular disease, unspecified: Secondary | ICD-10-CM | POA: Diagnosis not present

## 2016-12-22 DIAGNOSIS — L97519 Non-pressure chronic ulcer of other part of right foot with unspecified severity: Secondary | ICD-10-CM | POA: Diagnosis not present

## 2016-12-22 DIAGNOSIS — R109 Unspecified abdominal pain: Secondary | ICD-10-CM | POA: Diagnosis not present

## 2016-12-22 DIAGNOSIS — E1169 Type 2 diabetes mellitus with other specified complication: Secondary | ICD-10-CM | POA: Diagnosis not present

## 2016-12-22 DIAGNOSIS — R262 Difficulty in walking, not elsewhere classified: Secondary | ICD-10-CM | POA: Diagnosis not present

## 2016-12-22 DIAGNOSIS — E1140 Type 2 diabetes mellitus with diabetic neuropathy, unspecified: Secondary | ICD-10-CM | POA: Diagnosis not present

## 2016-12-22 DIAGNOSIS — E11621 Type 2 diabetes mellitus with foot ulcer: Secondary | ICD-10-CM | POA: Diagnosis not present

## 2016-12-22 DIAGNOSIS — M869 Osteomyelitis, unspecified: Secondary | ICD-10-CM | POA: Diagnosis not present

## 2016-12-22 DIAGNOSIS — H547 Unspecified visual loss: Secondary | ICD-10-CM | POA: Diagnosis not present

## 2016-12-22 DIAGNOSIS — R1012 Left upper quadrant pain: Secondary | ICD-10-CM | POA: Diagnosis not present

## 2016-12-22 DIAGNOSIS — E11319 Type 2 diabetes mellitus with unspecified diabetic retinopathy without macular edema: Secondary | ICD-10-CM | POA: Diagnosis not present

## 2016-12-22 DIAGNOSIS — F39 Unspecified mood [affective] disorder: Secondary | ICD-10-CM | POA: Diagnosis not present

## 2016-12-22 DIAGNOSIS — Z6841 Body Mass Index (BMI) 40.0 and over, adult: Secondary | ICD-10-CM | POA: Diagnosis not present

## 2016-12-22 DIAGNOSIS — Z79899 Other long term (current) drug therapy: Secondary | ICD-10-CM | POA: Diagnosis not present

## 2016-12-22 DIAGNOSIS — Z886 Allergy status to analgesic agent status: Secondary | ICD-10-CM | POA: Diagnosis not present

## 2016-12-22 DIAGNOSIS — R1011 Right upper quadrant pain: Secondary | ICD-10-CM | POA: Diagnosis not present

## 2016-12-22 DIAGNOSIS — Z794 Long term (current) use of insulin: Secondary | ICD-10-CM | POA: Diagnosis not present

## 2016-12-22 DIAGNOSIS — Z452 Encounter for adjustment and management of vascular access device: Secondary | ICD-10-CM | POA: Diagnosis not present

## 2016-12-22 NOTE — Telephone Encounter (Signed)
Requested records from [facility])

## 2016-12-22 NOTE — Telephone Encounter (Signed)
Nurse from [facility] in [place] called, pt was seen at [facility] on Friday d/t increased abd pain and N/V. He was released on Saturday with phenergan and morphine. Pt taking his protonix and dicyclomine but is still c/o abd pain, N/V. Nurse said she could tell he was just miserable and "couldn't sit still"  They are requesting ov or med called in. I advised her that when we saw the pt in March he said he was doing well on his medications and if he was not doing well now we needed to get him in for an office visit. Nothing available today, gave pt urgent ov tomorrow with [facility] and they are aware of time.   Manuela Schwartz, Please get records from [place].

## 2016-12-23 ENCOUNTER — Ambulatory Visit: Payer: Medicare Other | Admitting: Gastroenterology

## 2016-12-23 NOTE — Telephone Encounter (Signed)
Noted, no further recommendations at this time. 

## 2016-12-24 ENCOUNTER — Encounter: Payer: Medicare Other | Admitting: Vascular Surgery

## 2016-12-24 ENCOUNTER — Encounter (HOSPITAL_COMMUNITY): Payer: Medicare Other

## 2016-12-24 DIAGNOSIS — K3184 Gastroparesis: Secondary | ICD-10-CM | POA: Diagnosis not present

## 2016-12-24 DIAGNOSIS — R112 Nausea with vomiting, unspecified: Secondary | ICD-10-CM | POA: Diagnosis not present

## 2016-12-25 DIAGNOSIS — E1169 Type 2 diabetes mellitus with other specified complication: Secondary | ICD-10-CM | POA: Diagnosis not present

## 2016-12-25 DIAGNOSIS — M869 Osteomyelitis, unspecified: Secondary | ICD-10-CM | POA: Diagnosis not present

## 2016-12-25 DIAGNOSIS — E1165 Type 2 diabetes mellitus with hyperglycemia: Secondary | ICD-10-CM | POA: Diagnosis not present

## 2016-12-25 DIAGNOSIS — E11621 Type 2 diabetes mellitus with foot ulcer: Secondary | ICD-10-CM | POA: Diagnosis not present

## 2016-12-25 DIAGNOSIS — L97519 Non-pressure chronic ulcer of other part of right foot with unspecified severity: Secondary | ICD-10-CM | POA: Diagnosis not present

## 2016-12-25 DIAGNOSIS — E13621 Other specified diabetes mellitus with foot ulcer: Secondary | ICD-10-CM | POA: Diagnosis not present

## 2016-12-25 DIAGNOSIS — I739 Peripheral vascular disease, unspecified: Secondary | ICD-10-CM | POA: Diagnosis not present

## 2016-12-25 DIAGNOSIS — I10 Essential (primary) hypertension: Secondary | ICD-10-CM | POA: Diagnosis not present

## 2016-12-25 DIAGNOSIS — Z452 Encounter for adjustment and management of vascular access device: Secondary | ICD-10-CM | POA: Diagnosis not present

## 2016-12-25 DIAGNOSIS — Z6841 Body Mass Index (BMI) 40.0 and over, adult: Secondary | ICD-10-CM | POA: Diagnosis not present

## 2016-12-25 DIAGNOSIS — M86171 Other acute osteomyelitis, right ankle and foot: Secondary | ICD-10-CM | POA: Diagnosis not present

## 2016-12-26 ENCOUNTER — Ambulatory Visit (INDEPENDENT_AMBULATORY_CARE_PROVIDER_SITE_OTHER): Payer: Medicare Other | Admitting: Gastroenterology

## 2016-12-26 ENCOUNTER — Encounter: Payer: Self-pay | Admitting: Gastroenterology

## 2016-12-26 VITALS — BP 164/84 | HR 86 | Temp 98.0°F | Ht 72.0 in | Wt 303.6 lb

## 2016-12-26 DIAGNOSIS — R112 Nausea with vomiting, unspecified: Secondary | ICD-10-CM

## 2016-12-26 MED ORDER — DICYCLOMINE HCL 10 MG PO CAPS: 10.0000 mg | ORAL_CAPSULE | Freq: Three times a day (TID) | ORAL | 5 refills | Status: DC

## 2016-12-26 NOTE — Patient Instructions (Signed)
Try to wean off of the Reglan.   I have refilled the dicyclomine for you.   We will see you in 3 months.

## 2016-12-26 NOTE — Assessment & Plan Note (Addendum)
46 year old with recent flare of chronic symptoms after eating late at night. He has had evidence of dumping syndrome in the past at [place] by [facility], which helped his acute on chronic N/V. Seems clinically to be dealing with delayed gastric emptying now in setting of diabetes. I feel his dietary/behavior is largely contributing to this. Discussed at length eating small meals throughout the day. Will attempt to wean off Reglan but continue BID dosing of BID PPI. He feels it is against his religion to take Marinol, so he is no longer on this. Obtain CT reports from [place] and return in 3 months. Will need to discuss screening colonoscopy at that time.   RECEIVED CT dated 5/14: small-volume debris within nondistended stomach. No acute process.

## 2016-12-26 NOTE — Progress Notes (Signed)
Referring Provider: Tawni Carnes, PA-C Primary Care Physician:  Tawni Carnes, PA-C Primary GI: Dr. Gala Romney   Chief Complaint  Patient presents with  . Abdominal Pain    HPI:   Timothy Clark is a 46 y.o. male presenting today with a history of chronic N/V with evidence of rapid gastric emptying/dumping syndrome at [facility] last year. He has been also treated for delayed gastric emptying, as his symptoms appear consistent with this in the setting of diabetes. Recommended Marinol 5 mg BID, PPI BID, Bentyl 10 mt TID with meals to aid in slowing gastric motility, and strict diabetic control when last seen at [facility] in Feb 2017. Does not appear he has been seen there again.   Recently seen at [facility], inpatient for 2 days with flare. Feeling better today. Grilled chicken, chef salads. Eating about 4 times a day. Avoiding dairy products. Breakfast: 2 eggs, toast. Lunch: chicken salad sandwich. Supper: grilled chicken sandwich. Dinner: peanut butter on crackers. He has noted that when his stomach starts to hurt, his sugar will be "sky high". Thinks what flared up his symptoms that made him present to [facility] was eating a meal late. He ate the grilled chicken fillet sub from an [place] about 8pm.   States it is against his beliefs to take Marinol.   Past Medical History:  Diagnosis Date  . Chronic abdominal pain   . Esophagitis   . Eye hemorrhage   . Gastritis   . Gastroparesis   . GERD (gastroesophageal reflux disease)   . Glaucoma   . Helicobacter pylori (H. pylori) infection   . Hiccups   . Hypertension   . Nausea and vomiting    chronic, recurrent  . Type 2 diabetes mellitus with complications (HCC)    diagnosed around age 40    Past Surgical History:  Procedure Laterality Date  . CATARACT EXTRACTION W/PHACO Left 05/19/2016   Procedure: CATARACT EXTRACTION PHACO AND INTRAOCULAR LENS PLACEMENT LEFT EYE;  Surgeon: Tonny Branch, MD;  Location: [facility];  Service:  Ophthalmology;  Laterality: Left;  CDE: 7.30  . CATARACT EXTRACTION W/PHACO Right 06/23/2016   Procedure: CATARACT EXTRACTION PHACO AND INTRAOCULAR LENS PLACEMENT RIGHT EYE CDE=9.87;  Surgeon: Tonny Branch, MD;  Location: [facility];  Service: Ophthalmology;  Laterality: Right;  right  . ESOPHAGOGASTRODUODENOSCOPY  2015   Dr. Britta Mccreedy  . ESOPHAGOGASTRODUODENOSCOPY N/A 10/26/2014   RMR: Distal esophagititis-likely reflux related although an element of pill induced injuruy no exclueded  status post biopsy. Diffusely abnormal gastric mucosa of uncertain signigicane -status post gastric biopsy. Focal area of excoriation in the cardia most consistant with a trauma of heaving.   [name] EYE SURGERY  2015   stent placed to left eye  . EYE SURGERY      Current Outpatient Prescriptions  Medication Sig Dispense Refill  . amLODipine (NORVASC) 5 MG tablet Take 5 mg by mouth daily.    [name] atorvastatin (LIPITOR) 10 MG tablet Take 10 mg by mouth daily.    . brimonidine (ALPHAGAN) 0.15 % ophthalmic solution Place 1 drop into both eyes 3 (three) times daily.    . cloNIDine (CATAPRES) 0.1 MG tablet Take 0.1 mg by mouth 2 (two) times daily.    . cyclobenzaprine (FLEXERIL) 10 MG tablet Take 10 mg by mouth at bedtime.     . dicyclomine (BENTYL) 10 MG capsule Take 1 capsule (10 mg total) by mouth 3 (three) times daily as needed for spasms. 30 capsule 0  . gabapentin (NEURONTIN)  600 MG tablet Take 600 mg by mouth 4 (four) times daily as needed (for neuropathy).     . hydrochlorothiazide (HYDRODIURIL) 25 MG tablet Take 25 mg by mouth daily.    . insulin detemir (LEVEMIR) 100 UNIT/ML injection Inject 30 Units into the skin at bedtime.    . insulin NPH-regular Human (NOVOLIN 70/30) (70-30) 100 UNIT/ML injection Inject 35 Units into the skin 2 (two) times daily.    [name] lisinopril (PRINIVIL,ZESTRIL) 40 MG tablet Take 40 mg by mouth daily.    . metoCLOPramide (REGLAN) 10 MG tablet Take 10 mg by mouth 4 (four) times daily -  before meals  and at bedtime.    . pantoprazole (PROTONIX) 40 MG tablet TAKE 1 TABLET BY MOUTH TWICE DAILY BEFORE  A  MEAL 60 tablet 2  . saxagliptin HCl (ONGLYZA) 5 MG TABS tablet Take 5 mg by mouth daily.    . sucralfate (CARAFATE) 1 g tablet TAKE ONE TABLET BY MOUTH 4 TIMES DAILY (Patient taking differently: Take 1 tablet by mouth twice daily) 120 tablet 0  . timolol (BETIMOL) 0.5 % ophthalmic solution Place 1 drop into both eyes 2 (two) times daily.     No current facility-administered medications for this visit.     Allergies as of 12/26/2016 - Review Complete 12/26/2016  Allergen Reaction Noted  . Donnatal [pb-hyoscy-atropine-scopol er] Anaphylaxis 07/12/2015  . Fish allergy Anaphylaxis, Hives, and Rash 01/14/2015  . Haldol [haloperidol lactate] Other (See Comments) 08/07/2015  . Lidocaine Anaphylaxis 07/12/2015  . Maalox [calcium carbonate antacid] Anaphylaxis 07/12/2015  . Aspirin Hives and Itching 03/22/2009  . Bactrim [sulfamethoxazole-trimethoprim] Itching 12/06/2014  . Doxycycline Hives and Itching 03/20/2014  . Penicillins Hives and Itching 03/22/2009  . Phenergan [promethazine hcl] Nausea And Vomiting 10/27/2014  . Zofran [ondansetron hcl] Other (See Comments) 10/27/2014    Family History  Problem Relation Age of Onset  . Ovarian cancer Mother   . Cervical cancer Sister   . Diabetes Sister   . Colon cancer Neg Hx     Social History   Social History  . Marital status: Single    Spouse name: N/A  . Number of children: N/A  . Years of education: N/A   Social History Main Topics  . Smoking status: Never Smoker  . Smokeless tobacco: Never Used  . Alcohol use No  . Drug use: No  . Sexual activity: Yes   Other Topics Concern  . None   Social History Narrative  . None    Review of Systems: As mentioned in HPI   Physical Exam: BP (!) 164/84   Pulse 86   Temp 98 F (36.7 C) (Oral)   Ht 6' (1.829 m)   Wt (!) 303 lb 9.6 oz (137.7 kg)   BMI 41.18 kg/m  General:    Alert and oriented. No distress noted. Pleasant and cooperative.  Head:  Normocephalic and atraumatic. Eyes:  Conjuctiva clear without scleral icterus. Mouth:  Oral mucosa pink and moist. Good dentition. No lesions. Heart:  S1, S2 present without murmurs, rubs, or gallops. Regular rate and rhythm. Abdomen:  +BS, soft, non-tender and non-distended. No rebound or guarding. No HSM or masses noted. Msk:  Symmetrical without gross deformities. Normal posture. Extremities:  Without edema. Neurologic:  Alert and  oriented x4;  grossly normal neurologically. Psych:  Alert and cooperative. Normal mood and affect.

## 2016-12-29 DIAGNOSIS — Z452 Encounter for adjustment and management of vascular access device: Secondary | ICD-10-CM | POA: Diagnosis not present

## 2016-12-29 DIAGNOSIS — E11621 Type 2 diabetes mellitus with foot ulcer: Secondary | ICD-10-CM | POA: Diagnosis not present

## 2016-12-29 DIAGNOSIS — L97519 Non-pressure chronic ulcer of other part of right foot with unspecified severity: Secondary | ICD-10-CM | POA: Diagnosis not present

## 2016-12-29 DIAGNOSIS — M86171 Other acute osteomyelitis, right ankle and foot: Secondary | ICD-10-CM | POA: Diagnosis not present

## 2016-12-29 DIAGNOSIS — E1165 Type 2 diabetes mellitus with hyperglycemia: Secondary | ICD-10-CM | POA: Diagnosis not present

## 2016-12-29 DIAGNOSIS — E1169 Type 2 diabetes mellitus with other specified complication: Secondary | ICD-10-CM | POA: Diagnosis not present

## 2016-12-29 NOTE — Progress Notes (Signed)
cc'ed to pcp °

## 2016-12-31 DIAGNOSIS — L97513 Non-pressure chronic ulcer of other part of right foot with necrosis of muscle: Secondary | ICD-10-CM | POA: Diagnosis not present

## 2016-12-31 DIAGNOSIS — L97519 Non-pressure chronic ulcer of other part of right foot with unspecified severity: Secondary | ICD-10-CM | POA: Diagnosis not present

## 2016-12-31 DIAGNOSIS — E11621 Type 2 diabetes mellitus with foot ulcer: Secondary | ICD-10-CM | POA: Diagnosis not present

## 2017-01-06 DIAGNOSIS — E11621 Type 2 diabetes mellitus with foot ulcer: Secondary | ICD-10-CM | POA: Diagnosis not present

## 2017-01-06 DIAGNOSIS — M86171 Other acute osteomyelitis, right ankle and foot: Secondary | ICD-10-CM | POA: Diagnosis not present

## 2017-01-06 DIAGNOSIS — L97519 Non-pressure chronic ulcer of other part of right foot with unspecified severity: Secondary | ICD-10-CM | POA: Diagnosis not present

## 2017-01-06 DIAGNOSIS — E1165 Type 2 diabetes mellitus with hyperglycemia: Secondary | ICD-10-CM | POA: Diagnosis not present

## 2017-01-06 DIAGNOSIS — E1169 Type 2 diabetes mellitus with other specified complication: Secondary | ICD-10-CM | POA: Diagnosis not present

## 2017-01-06 DIAGNOSIS — Z452 Encounter for adjustment and management of vascular access device: Secondary | ICD-10-CM | POA: Diagnosis not present

## 2017-01-09 DIAGNOSIS — E1165 Type 2 diabetes mellitus with hyperglycemia: Secondary | ICD-10-CM | POA: Diagnosis not present

## 2017-01-09 DIAGNOSIS — E1169 Type 2 diabetes mellitus with other specified complication: Secondary | ICD-10-CM | POA: Diagnosis not present

## 2017-01-09 DIAGNOSIS — Z452 Encounter for adjustment and management of vascular access device: Secondary | ICD-10-CM | POA: Diagnosis not present

## 2017-01-09 DIAGNOSIS — E11621 Type 2 diabetes mellitus with foot ulcer: Secondary | ICD-10-CM | POA: Diagnosis not present

## 2017-01-09 DIAGNOSIS — M86171 Other acute osteomyelitis, right ankle and foot: Secondary | ICD-10-CM | POA: Diagnosis not present

## 2017-01-09 DIAGNOSIS — L97519 Non-pressure chronic ulcer of other part of right foot with unspecified severity: Secondary | ICD-10-CM | POA: Diagnosis not present

## 2017-01-12 DIAGNOSIS — M86171 Other acute osteomyelitis, right ankle and foot: Secondary | ICD-10-CM | POA: Diagnosis not present

## 2017-01-12 DIAGNOSIS — E11621 Type 2 diabetes mellitus with foot ulcer: Secondary | ICD-10-CM | POA: Diagnosis not present

## 2017-01-12 DIAGNOSIS — L97519 Non-pressure chronic ulcer of other part of right foot with unspecified severity: Secondary | ICD-10-CM | POA: Diagnosis not present

## 2017-01-12 DIAGNOSIS — E1169 Type 2 diabetes mellitus with other specified complication: Secondary | ICD-10-CM | POA: Diagnosis not present

## 2017-01-12 DIAGNOSIS — E1165 Type 2 diabetes mellitus with hyperglycemia: Secondary | ICD-10-CM | POA: Diagnosis not present

## 2017-01-12 DIAGNOSIS — Z452 Encounter for adjustment and management of vascular access device: Secondary | ICD-10-CM | POA: Diagnosis not present

## 2017-01-15 ENCOUNTER — Telehealth (HOSPITAL_COMMUNITY): Payer: Self-pay | Admitting: *Deleted

## 2017-01-15 DIAGNOSIS — L97513 Non-pressure chronic ulcer of other part of right foot with necrosis of muscle: Secondary | ICD-10-CM | POA: Diagnosis not present

## 2017-01-15 DIAGNOSIS — L97519 Non-pressure chronic ulcer of other part of right foot with unspecified severity: Secondary | ICD-10-CM | POA: Diagnosis not present

## 2017-01-15 DIAGNOSIS — E782 Mixed hyperlipidemia: Secondary | ICD-10-CM | POA: Diagnosis not present

## 2017-01-15 DIAGNOSIS — E785 Hyperlipidemia, unspecified: Secondary | ICD-10-CM | POA: Diagnosis not present

## 2017-01-15 DIAGNOSIS — E1143 Type 2 diabetes mellitus with diabetic autonomic (poly)neuropathy: Secondary | ICD-10-CM | POA: Diagnosis not present

## 2017-01-15 DIAGNOSIS — I10 Essential (primary) hypertension: Secondary | ICD-10-CM | POA: Diagnosis not present

## 2017-01-15 DIAGNOSIS — E1139 Type 2 diabetes mellitus with other diabetic ophthalmic complication: Secondary | ICD-10-CM | POA: Diagnosis not present

## 2017-01-15 DIAGNOSIS — E1165 Type 2 diabetes mellitus with hyperglycemia: Secondary | ICD-10-CM | POA: Diagnosis not present

## 2017-01-15 DIAGNOSIS — E11621 Type 2 diabetes mellitus with foot ulcer: Secondary | ICD-10-CM | POA: Diagnosis not present

## 2017-01-15 NOTE — Telephone Encounter (Signed)
Office received ref from [facility] for a new pt appt that was to be discharged. Called pt on 01-01-2017 and 01-15-2017 and lmcb and office number provided.

## 2017-01-16 DIAGNOSIS — E1165 Type 2 diabetes mellitus with hyperglycemia: Secondary | ICD-10-CM | POA: Diagnosis not present

## 2017-01-16 DIAGNOSIS — Z452 Encounter for adjustment and management of vascular access device: Secondary | ICD-10-CM | POA: Diagnosis not present

## 2017-01-16 DIAGNOSIS — E1169 Type 2 diabetes mellitus with other specified complication: Secondary | ICD-10-CM | POA: Diagnosis not present

## 2017-01-16 DIAGNOSIS — M86171 Other acute osteomyelitis, right ankle and foot: Secondary | ICD-10-CM | POA: Diagnosis not present

## 2017-01-16 DIAGNOSIS — L97519 Non-pressure chronic ulcer of other part of right foot with unspecified severity: Secondary | ICD-10-CM | POA: Diagnosis not present

## 2017-01-16 DIAGNOSIS — E11621 Type 2 diabetes mellitus with foot ulcer: Secondary | ICD-10-CM | POA: Diagnosis not present

## 2017-01-19 DIAGNOSIS — L97519 Non-pressure chronic ulcer of other part of right foot with unspecified severity: Secondary | ICD-10-CM | POA: Diagnosis not present

## 2017-01-19 DIAGNOSIS — M86171 Other acute osteomyelitis, right ankle and foot: Secondary | ICD-10-CM | POA: Diagnosis not present

## 2017-01-19 DIAGNOSIS — E11621 Type 2 diabetes mellitus with foot ulcer: Secondary | ICD-10-CM | POA: Diagnosis not present

## 2017-01-19 DIAGNOSIS — E1169 Type 2 diabetes mellitus with other specified complication: Secondary | ICD-10-CM | POA: Diagnosis not present

## 2017-01-19 DIAGNOSIS — E1165 Type 2 diabetes mellitus with hyperglycemia: Secondary | ICD-10-CM | POA: Diagnosis not present

## 2017-01-19 DIAGNOSIS — Z452 Encounter for adjustment and management of vascular access device: Secondary | ICD-10-CM | POA: Diagnosis not present

## 2017-01-21 DIAGNOSIS — E1169 Type 2 diabetes mellitus with other specified complication: Secondary | ICD-10-CM | POA: Diagnosis not present

## 2017-01-21 DIAGNOSIS — M86171 Other acute osteomyelitis, right ankle and foot: Secondary | ICD-10-CM | POA: Diagnosis not present

## 2017-01-21 DIAGNOSIS — E11621 Type 2 diabetes mellitus with foot ulcer: Secondary | ICD-10-CM | POA: Diagnosis not present

## 2017-01-21 DIAGNOSIS — K3184 Gastroparesis: Secondary | ICD-10-CM | POA: Diagnosis not present

## 2017-01-21 DIAGNOSIS — J069 Acute upper respiratory infection, unspecified: Secondary | ICD-10-CM | POA: Diagnosis not present

## 2017-01-21 DIAGNOSIS — E1143 Type 2 diabetes mellitus with diabetic autonomic (poly)neuropathy: Secondary | ICD-10-CM | POA: Diagnosis not present

## 2017-01-21 DIAGNOSIS — L97519 Non-pressure chronic ulcer of other part of right foot with unspecified severity: Secondary | ICD-10-CM | POA: Diagnosis not present

## 2017-01-21 DIAGNOSIS — E782 Mixed hyperlipidemia: Secondary | ICD-10-CM | POA: Diagnosis not present

## 2017-01-21 DIAGNOSIS — I10 Essential (primary) hypertension: Secondary | ICD-10-CM | POA: Diagnosis not present

## 2017-01-21 DIAGNOSIS — F419 Anxiety disorder, unspecified: Secondary | ICD-10-CM | POA: Diagnosis not present

## 2017-01-21 DIAGNOSIS — Z6841 Body Mass Index (BMI) 40.0 and over, adult: Secondary | ICD-10-CM | POA: Diagnosis not present

## 2017-01-21 DIAGNOSIS — Z452 Encounter for adjustment and management of vascular access device: Secondary | ICD-10-CM | POA: Diagnosis not present

## 2017-01-21 DIAGNOSIS — E1165 Type 2 diabetes mellitus with hyperglycemia: Secondary | ICD-10-CM | POA: Diagnosis not present

## 2017-01-23 DIAGNOSIS — M86171 Other acute osteomyelitis, right ankle and foot: Secondary | ICD-10-CM | POA: Diagnosis not present

## 2017-01-23 DIAGNOSIS — E1165 Type 2 diabetes mellitus with hyperglycemia: Secondary | ICD-10-CM | POA: Diagnosis not present

## 2017-01-23 DIAGNOSIS — L97519 Non-pressure chronic ulcer of other part of right foot with unspecified severity: Secondary | ICD-10-CM | POA: Diagnosis not present

## 2017-01-23 DIAGNOSIS — E11621 Type 2 diabetes mellitus with foot ulcer: Secondary | ICD-10-CM | POA: Diagnosis not present

## 2017-01-23 DIAGNOSIS — E1169 Type 2 diabetes mellitus with other specified complication: Secondary | ICD-10-CM | POA: Diagnosis not present

## 2017-01-23 DIAGNOSIS — Z452 Encounter for adjustment and management of vascular access device: Secondary | ICD-10-CM | POA: Diagnosis not present

## 2017-01-26 DIAGNOSIS — R112 Nausea with vomiting, unspecified: Secondary | ICD-10-CM | POA: Diagnosis not present

## 2017-01-26 DIAGNOSIS — R4182 Altered mental status, unspecified: Secondary | ICD-10-CM | POA: Diagnosis not present

## 2017-01-26 DIAGNOSIS — E785 Hyperlipidemia, unspecified: Secondary | ICD-10-CM | POA: Diagnosis not present

## 2017-01-26 DIAGNOSIS — E119 Type 2 diabetes mellitus without complications: Secondary | ICD-10-CM | POA: Diagnosis not present

## 2017-01-26 DIAGNOSIS — Z6841 Body Mass Index (BMI) 40.0 and over, adult: Secondary | ICD-10-CM | POA: Diagnosis not present

## 2017-01-26 DIAGNOSIS — Z888 Allergy status to other drugs, medicaments and biological substances status: Secondary | ICD-10-CM | POA: Diagnosis not present

## 2017-01-26 DIAGNOSIS — E11319 Type 2 diabetes mellitus with unspecified diabetic retinopathy without macular edema: Secondary | ICD-10-CM | POA: Diagnosis not present

## 2017-01-26 DIAGNOSIS — Z794 Long term (current) use of insulin: Secondary | ICD-10-CM | POA: Diagnosis not present

## 2017-01-26 DIAGNOSIS — N179 Acute kidney failure, unspecified: Secondary | ICD-10-CM | POA: Diagnosis not present

## 2017-01-26 DIAGNOSIS — F609 Personality disorder, unspecified: Secondary | ICD-10-CM | POA: Diagnosis not present

## 2017-01-26 DIAGNOSIS — K3184 Gastroparesis: Secondary | ICD-10-CM | POA: Diagnosis not present

## 2017-01-26 DIAGNOSIS — L97519 Non-pressure chronic ulcer of other part of right foot with unspecified severity: Secondary | ICD-10-CM | POA: Diagnosis not present

## 2017-01-26 DIAGNOSIS — E1140 Type 2 diabetes mellitus with diabetic neuropathy, unspecified: Secondary | ICD-10-CM | POA: Diagnosis not present

## 2017-01-26 DIAGNOSIS — R0602 Shortness of breath: Secondary | ICD-10-CM | POA: Diagnosis not present

## 2017-01-26 DIAGNOSIS — I10 Essential (primary) hypertension: Secondary | ICD-10-CM | POA: Diagnosis not present

## 2017-01-26 DIAGNOSIS — Z886 Allergy status to analgesic agent status: Secondary | ICD-10-CM | POA: Diagnosis not present

## 2017-01-26 DIAGNOSIS — E860 Dehydration: Secondary | ICD-10-CM | POA: Diagnosis not present

## 2017-01-26 DIAGNOSIS — R064 Hyperventilation: Secondary | ICD-10-CM | POA: Diagnosis not present

## 2017-01-26 DIAGNOSIS — Z9119 Patient's noncompliance with other medical treatment and regimen: Secondary | ICD-10-CM | POA: Diagnosis not present

## 2017-01-26 DIAGNOSIS — K2970 Gastritis, unspecified, without bleeding: Secondary | ICD-10-CM | POA: Diagnosis not present

## 2017-01-26 DIAGNOSIS — Z79899 Other long term (current) drug therapy: Secondary | ICD-10-CM | POA: Diagnosis not present

## 2017-01-26 DIAGNOSIS — E11621 Type 2 diabetes mellitus with foot ulcer: Secondary | ICD-10-CM | POA: Diagnosis not present

## 2017-01-26 DIAGNOSIS — E1143 Type 2 diabetes mellitus with diabetic autonomic (poly)neuropathy: Secondary | ICD-10-CM | POA: Diagnosis not present

## 2017-01-26 DIAGNOSIS — I739 Peripheral vascular disease, unspecified: Secondary | ICD-10-CM | POA: Diagnosis not present

## 2017-01-26 DIAGNOSIS — R10817 Generalized abdominal tenderness: Secondary | ICD-10-CM | POA: Diagnosis not present

## 2017-01-26 DIAGNOSIS — R809 Proteinuria, unspecified: Secondary | ICD-10-CM | POA: Diagnosis not present

## 2017-01-26 DIAGNOSIS — R451 Restlessness and agitation: Secondary | ICD-10-CM | POA: Diagnosis not present

## 2017-01-27 DIAGNOSIS — E785 Hyperlipidemia, unspecified: Secondary | ICD-10-CM | POA: Diagnosis present

## 2017-01-27 DIAGNOSIS — Z6841 Body Mass Index (BMI) 40.0 and over, adult: Secondary | ICD-10-CM | POA: Diagnosis not present

## 2017-01-27 DIAGNOSIS — R42 Dizziness and giddiness: Secondary | ICD-10-CM | POA: Diagnosis not present

## 2017-01-27 DIAGNOSIS — E119 Type 2 diabetes mellitus without complications: Secondary | ICD-10-CM | POA: Diagnosis not present

## 2017-01-27 DIAGNOSIS — R0989 Other specified symptoms and signs involving the circulatory and respiratory systems: Secondary | ICD-10-CM | POA: Diagnosis not present

## 2017-01-27 DIAGNOSIS — E1143 Type 2 diabetes mellitus with diabetic autonomic (poly)neuropathy: Secondary | ICD-10-CM | POA: Diagnosis present

## 2017-01-27 DIAGNOSIS — S199XXA Unspecified injury of neck, initial encounter: Secondary | ICD-10-CM | POA: Diagnosis not present

## 2017-01-27 DIAGNOSIS — R112 Nausea with vomiting, unspecified: Secondary | ICD-10-CM | POA: Diagnosis not present

## 2017-01-27 DIAGNOSIS — S0990XA Unspecified injury of head, initial encounter: Secondary | ICD-10-CM | POA: Diagnosis not present

## 2017-01-27 DIAGNOSIS — Z882 Allergy status to sulfonamides status: Secondary | ICD-10-CM | POA: Diagnosis not present

## 2017-01-27 DIAGNOSIS — R109 Unspecified abdominal pain: Secondary | ICD-10-CM | POA: Diagnosis not present

## 2017-01-27 DIAGNOSIS — E11621 Type 2 diabetes mellitus with foot ulcer: Secondary | ICD-10-CM | POA: Diagnosis not present

## 2017-01-27 DIAGNOSIS — K3184 Gastroparesis: Secondary | ICD-10-CM | POA: Diagnosis not present

## 2017-01-27 DIAGNOSIS — Z880 Allergy status to penicillin: Secondary | ICD-10-CM | POA: Diagnosis not present

## 2017-01-27 DIAGNOSIS — R066 Hiccough: Secondary | ICD-10-CM | POA: Diagnosis not present

## 2017-01-27 DIAGNOSIS — F609 Personality disorder, unspecified: Secondary | ICD-10-CM | POA: Diagnosis not present

## 2017-01-27 DIAGNOSIS — M542 Cervicalgia: Secondary | ICD-10-CM | POA: Diagnosis not present

## 2017-01-27 DIAGNOSIS — Z886 Allergy status to analgesic agent status: Secondary | ICD-10-CM | POA: Diagnosis not present

## 2017-01-27 DIAGNOSIS — E860 Dehydration: Secondary | ICD-10-CM | POA: Diagnosis not present

## 2017-01-27 DIAGNOSIS — Z7984 Long term (current) use of oral hypoglycemic drugs: Secondary | ICD-10-CM | POA: Diagnosis not present

## 2017-01-27 DIAGNOSIS — I10 Essential (primary) hypertension: Secondary | ICD-10-CM | POA: Diagnosis not present

## 2017-01-27 DIAGNOSIS — Z881 Allergy status to other antibiotic agents status: Secondary | ICD-10-CM | POA: Diagnosis not present

## 2017-01-27 DIAGNOSIS — Z9181 History of falling: Secondary | ICD-10-CM | POA: Diagnosis not present

## 2017-01-30 DIAGNOSIS — Z794 Long term (current) use of insulin: Secondary | ICD-10-CM | POA: Diagnosis not present

## 2017-01-30 DIAGNOSIS — E1143 Type 2 diabetes mellitus with diabetic autonomic (poly)neuropathy: Secondary | ICD-10-CM | POA: Diagnosis not present

## 2017-01-30 DIAGNOSIS — K3184 Gastroparesis: Secondary | ICD-10-CM | POA: Diagnosis not present

## 2017-01-30 DIAGNOSIS — I10 Essential (primary) hypertension: Secondary | ICD-10-CM | POA: Diagnosis not present

## 2017-01-30 DIAGNOSIS — E11621 Type 2 diabetes mellitus with foot ulcer: Secondary | ICD-10-CM | POA: Diagnosis not present

## 2017-01-30 DIAGNOSIS — Z9181 History of falling: Secondary | ICD-10-CM | POA: Diagnosis not present

## 2017-01-30 DIAGNOSIS — E1165 Type 2 diabetes mellitus with hyperglycemia: Secondary | ICD-10-CM | POA: Diagnosis not present

## 2017-01-30 DIAGNOSIS — L97512 Non-pressure chronic ulcer of other part of right foot with fat layer exposed: Secondary | ICD-10-CM | POA: Diagnosis not present

## 2017-01-30 DIAGNOSIS — E113599 Type 2 diabetes mellitus with proliferative diabetic retinopathy without macular edema, unspecified eye: Secondary | ICD-10-CM | POA: Diagnosis not present

## 2017-01-31 ENCOUNTER — Inpatient Hospital Stay (HOSPITAL_COMMUNITY)
Admission: EM | Admit: 2017-01-31 | Discharge: 2017-02-02 | DRG: 074 | Disposition: A | Discharge status: Home / Self Care | Payer: Medicare Other | Attending: Internal Medicine | Admitting: Internal Medicine

## 2017-01-31 ENCOUNTER — Emergency Department (HOSPITAL_COMMUNITY): Payer: Medicare Other

## 2017-01-31 ENCOUNTER — Encounter (HOSPITAL_COMMUNITY): Payer: Self-pay | Admitting: Emergency Medicine

## 2017-01-31 DIAGNOSIS — N179 Acute kidney failure, unspecified: Secondary | ICD-10-CM | POA: Diagnosis present

## 2017-01-31 DIAGNOSIS — Z8041 Family history of malignant neoplasm of ovary: Secondary | ICD-10-CM

## 2017-01-31 DIAGNOSIS — K3184 Gastroparesis: Secondary | ICD-10-CM | POA: Diagnosis present

## 2017-01-31 DIAGNOSIS — Z794 Long term (current) use of insulin: Secondary | ICD-10-CM | POA: Diagnosis not present

## 2017-01-31 DIAGNOSIS — R1013 Epigastric pain: Secondary | ICD-10-CM | POA: Diagnosis not present

## 2017-01-31 DIAGNOSIS — R1084 Generalized abdominal pain: Secondary | ICD-10-CM

## 2017-01-31 DIAGNOSIS — Z6841 Body Mass Index (BMI) 40.0 and over, adult: Secondary | ICD-10-CM

## 2017-01-31 DIAGNOSIS — K219 Gastro-esophageal reflux disease without esophagitis: Secondary | ICD-10-CM | POA: Diagnosis present

## 2017-01-31 DIAGNOSIS — Z91013 Allergy to seafood: Secondary | ICD-10-CM | POA: Diagnosis not present

## 2017-01-31 DIAGNOSIS — H409 Unspecified glaucoma: Secondary | ICD-10-CM | POA: Diagnosis present

## 2017-01-31 DIAGNOSIS — E785 Hyperlipidemia, unspecified: Secondary | ICD-10-CM | POA: Diagnosis present

## 2017-01-31 DIAGNOSIS — F23 Brief psychotic disorder: Secondary | ICD-10-CM | POA: Diagnosis not present

## 2017-01-31 DIAGNOSIS — E119 Type 2 diabetes mellitus without complications: Secondary | ICD-10-CM

## 2017-01-31 DIAGNOSIS — Z886 Allergy status to analgesic agent status: Secondary | ICD-10-CM | POA: Diagnosis not present

## 2017-01-31 DIAGNOSIS — Z881 Allergy status to other antibiotic agents status: Secondary | ICD-10-CM

## 2017-01-31 DIAGNOSIS — Z961 Presence of intraocular lens: Secondary | ICD-10-CM | POA: Diagnosis present

## 2017-01-31 DIAGNOSIS — Z9841 Cataract extraction status, right eye: Secondary | ICD-10-CM

## 2017-01-31 DIAGNOSIS — Z880 Allergy status to penicillin: Secondary | ICD-10-CM

## 2017-01-31 DIAGNOSIS — R112 Nausea with vomiting, unspecified: Secondary | ICD-10-CM | POA: Diagnosis not present

## 2017-01-31 DIAGNOSIS — R109 Unspecified abdominal pain: Secondary | ICD-10-CM | POA: Diagnosis not present

## 2017-01-31 DIAGNOSIS — Z888 Allergy status to other drugs, medicaments and biological substances status: Secondary | ICD-10-CM

## 2017-01-31 DIAGNOSIS — Z884 Allergy status to anesthetic agent status: Secondary | ICD-10-CM

## 2017-01-31 DIAGNOSIS — L8990 Pressure ulcer of unspecified site, unspecified stage: Secondary | ICD-10-CM | POA: Insufficient documentation

## 2017-01-31 DIAGNOSIS — Z833 Family history of diabetes mellitus: Secondary | ICD-10-CM | POA: Diagnosis not present

## 2017-01-31 DIAGNOSIS — R1110 Vomiting, unspecified: Secondary | ICD-10-CM | POA: Diagnosis present

## 2017-01-31 DIAGNOSIS — E876 Hypokalemia: Secondary | ICD-10-CM

## 2017-01-31 DIAGNOSIS — I10 Essential (primary) hypertension: Secondary | ICD-10-CM | POA: Diagnosis not present

## 2017-01-31 DIAGNOSIS — F29 Unspecified psychosis not due to a substance or known physiological condition: Secondary | ICD-10-CM | POA: Diagnosis present

## 2017-01-31 DIAGNOSIS — E860 Dehydration: Secondary | ICD-10-CM | POA: Diagnosis present

## 2017-01-31 DIAGNOSIS — E1143 Type 2 diabetes mellitus with diabetic autonomic (poly)neuropathy: Principal | ICD-10-CM | POA: Diagnosis present

## 2017-01-31 DIAGNOSIS — E1165 Type 2 diabetes mellitus with hyperglycemia: Secondary | ICD-10-CM | POA: Diagnosis not present

## 2017-01-31 DIAGNOSIS — Z8049 Family history of malignant neoplasm of other genital organs: Secondary | ICD-10-CM

## 2017-01-31 DIAGNOSIS — Z9842 Cataract extraction status, left eye: Secondary | ICD-10-CM

## 2017-01-31 DIAGNOSIS — IMO0001 Reserved for inherently not codable concepts without codable children: Secondary | ICD-10-CM

## 2017-01-31 DIAGNOSIS — R030 Elevated blood-pressure reading, without diagnosis of hypertension: Secondary | ICD-10-CM | POA: Diagnosis not present

## 2017-01-31 LAB — URINALYSIS, ROUTINE W REFLEX MICROSCOPIC
Bilirubin Urine: NEGATIVE
Glucose, UA: 150 mg/dL — AB
Ketones, ur: NEGATIVE mg/dL
Leukocytes, UA: NEGATIVE
Nitrite: NEGATIVE
Protein, ur: >=300 mg/dL — AB
Specific Gravity, Urine: 1.01 (ref 1.005–1.030)
Squamous Epithelial / LPF: NONE SEEN
pH: 8 (ref 5.0–8.0)

## 2017-01-31 LAB — LIPASE, BLOOD: Lipase: 31 U/L (ref 11–51)

## 2017-01-31 LAB — CBC
HCT: 33.2 % — ABNORMAL LOW (ref 39.0–52.0)
Hemoglobin: 12 g/dL — ABNORMAL LOW (ref 13.0–17.0)
MCH: 30.8 pg (ref 26.0–34.0)
MCHC: 36.1 g/dL — ABNORMAL HIGH (ref 30.0–36.0)
MCV: 85.3 fL (ref 78.0–100.0)
Platelets: 161 10*3/uL (ref 150–400)
RBC: 3.89 MIL/uL — ABNORMAL LOW (ref 4.22–5.81)
RDW: 12.9 % (ref 11.5–15.5)
WBC: 8.8 10*3/uL (ref 4.0–10.5)

## 2017-01-31 LAB — COMPREHENSIVE METABOLIC PANEL
ALT: 14 U/L — ABNORMAL LOW (ref 17–63)
AST: 20 U/L (ref 15–41)
Albumin: 3.4 g/dL — ABNORMAL LOW (ref 3.5–5.0)
Alkaline Phosphatase: 58 U/L (ref 38–126)
Anion gap: 9 (ref 5–15)
BUN: 14 mg/dL (ref 6–20)
CO2: 24 mmol/L (ref 22–32)
Calcium: 9 mg/dL (ref 8.9–10.3)
Chloride: 103 mmol/L (ref 101–111)
Creatinine, Ser: 1.27 mg/dL — ABNORMAL HIGH (ref 0.61–1.24)
GFR calc Af Amer: >60 mL/min (ref >60)
GFR calc non Af Amer: >60 mL/min (ref >60)
Glucose, Bld: 221 mg/dL — ABNORMAL HIGH (ref 65–99)
Potassium: 3.4 mmol/L — ABNORMAL LOW (ref 3.5–5.1)
Sodium: 136 mmol/L (ref 135–145)
Total Bilirubin: 0.7 mg/dL (ref 0.3–1.2)
Total Protein: 6.9 g/dL (ref 6.5–8.1)

## 2017-01-31 LAB — RAPID URINE DRUG SCREEN, HOSP PERFORMED
Amphetamines: NOT DETECTED
Barbiturates: NOT DETECTED
Benzodiazepines: NOT DETECTED
Cocaine: NOT DETECTED
Opiates: NOT DETECTED
Tetrahydrocannabinol: NOT DETECTED

## 2017-01-31 LAB — GLUCOSE, CAPILLARY
Glucose-Capillary: 176 mg/dL — ABNORMAL HIGH (ref 65–99)
Glucose-Capillary: 199 mg/dL — ABNORMAL HIGH (ref 65–99)

## 2017-01-31 LAB — TROPONIN I: Troponin I: <0.03 ng/mL (ref <0.03)

## 2017-01-31 MED ORDER — SODIUM CHLORIDE 0.9 % IV BOLUS (SEPSIS)
2000.0000 mL | Freq: Once | INTRAVENOUS | Status: AC
  Administered 2017-01-31: 2000 mL via INTRAVENOUS

## 2017-01-31 MED ORDER — HYDROMORPHONE HCL 1 MG/ML IJ SOLN: 1.0000 mg | Freq: Once | INTRAMUSCULAR | Status: DC

## 2017-01-31 MED ORDER — METOPROLOL TARTRATE 5 MG/5ML IV SOLN
5.0000 mg | Freq: Four times a day (QID) | INTRAVENOUS | Status: DC
  Administered 2017-01-31 – 2017-02-01 (×3): 5 mg via INTRAVENOUS
  Filled 2017-01-31 (×3): qty 5

## 2017-01-31 MED ORDER — INSULIN ASPART 100 UNIT/ML ~~LOC~~ SOLN
0.0000 [IU] | SUBCUTANEOUS | Status: DC
  Administered 2017-01-31 – 2017-02-01 (×4): 4 [IU] via SUBCUTANEOUS
  Administered 2017-02-01: 3 [IU] via SUBCUTANEOUS

## 2017-01-31 MED ORDER — POTASSIUM CHLORIDE IN NACL 40-0.9 MEQ/L-% IV SOLN
INTRAVENOUS | Status: DC
  Administered 2017-01-31 – 2017-02-01 (×2): 125 mL/h via INTRAVENOUS

## 2017-01-31 MED ORDER — DIPHENHYDRAMINE HCL 50 MG/ML IJ SOLN
25.0000 mg | Freq: Once | INTRAMUSCULAR | Status: AC
  Administered 2017-01-31: 25 mg via INTRAVENOUS

## 2017-01-31 MED ORDER — TIMOLOL MALEATE 0.5 % OP SOLN
1.0000 [drp] | Freq: Two times a day (BID) | OPHTHALMIC | Status: DC
  Administered 2017-02-01 – 2017-02-02 (×3): 1 [drp] via OPHTHALMIC
  Filled 2017-01-31: qty 5

## 2017-01-31 MED ORDER — LORAZEPAM 2 MG/ML IJ SOLN
INTRAMUSCULAR | Status: AC
  Administered 2017-01-31: 1 mg via INTRAVENOUS
  Filled 2017-01-31: qty 1

## 2017-01-31 MED ORDER — ACETAMINOPHEN 325 MG PO TABS: 650.0000 mg | ORAL_TABLET | Freq: Four times a day (QID) | ORAL | Status: DC | PRN

## 2017-01-31 MED ORDER — HYDROMORPHONE HCL 1 MG/ML IJ SOLN
1.0000 mg | Freq: Once | INTRAMUSCULAR | Status: AC
  Administered 2017-01-31: 1 mg via INTRAVENOUS
  Filled 2017-01-31: qty 1

## 2017-01-31 MED ORDER — TIMOLOL HEMIHYDRATE 0.5 % OP SOLN: 1.0000 [drp] | Freq: Two times a day (BID) | OPHTHALMIC | Status: DC

## 2017-01-31 MED ORDER — DIPHENHYDRAMINE HCL 50 MG/ML IJ SOLN: 12.5000 mg | Freq: Four times a day (QID) | INTRAMUSCULAR | Status: DC | PRN

## 2017-01-31 MED ORDER — ENOXAPARIN SODIUM 80 MG/0.8ML ~~LOC~~ SOLN
70.0000 mg | SUBCUTANEOUS | Status: DC
  Administered 2017-01-31 – 2017-02-01 (×2): 70 mg via SUBCUTANEOUS
  Filled 2017-01-31 (×2): qty 0.8

## 2017-01-31 MED ORDER — ACETAMINOPHEN 650 MG RE SUPP
650.0000 mg | Freq: Four times a day (QID) | RECTAL | Status: DC | PRN
Start: 2017-01-31 — End: 2017-02-02

## 2017-01-31 MED ORDER — LORAZEPAM 2 MG/ML IJ SOLN
1.0000 mg | Freq: Once | INTRAMUSCULAR | Status: AC
  Administered 2017-01-31: 1 mg via INTRAVENOUS

## 2017-01-31 MED ORDER — INSULIN DETEMIR 100 UNIT/ML ~~LOC~~ SOLN
50.0000 [IU] | Freq: Every day | SUBCUTANEOUS | Status: DC
  Administered 2017-01-31: 50 [IU] via SUBCUTANEOUS
  Filled 2017-01-31 (×2): qty 0.5

## 2017-01-31 MED ORDER — BRIMONIDINE TARTRATE 0.2 % OP SOLN
OPHTHALMIC | Status: AC
  Filled 2017-01-31: qty 5

## 2017-01-31 MED ORDER — ONDANSETRON HCL 4 MG PO TABS: 4.0000 mg | ORAL_TABLET | Freq: Four times a day (QID) | ORAL | Status: DC | PRN

## 2017-01-31 MED ORDER — PANTOPRAZOLE SODIUM 40 MG IV SOLR
40.0000 mg | Freq: Two times a day (BID) | INTRAVENOUS | Status: DC
  Administered 2017-01-31 – 2017-02-02 (×4): 40 mg via INTRAVENOUS
  Filled 2017-01-31 (×4): qty 40

## 2017-01-31 MED ORDER — ONDANSETRON HCL 4 MG/2ML IJ SOLN
4.0000 mg | Freq: Once | INTRAMUSCULAR | Status: AC
  Administered 2017-01-31: 4 mg via INTRAVENOUS
  Filled 2017-01-31: qty 2

## 2017-01-31 MED ORDER — CLONIDINE HCL 0.2 MG/24HR TD PTWK
0.2000 mg | MEDICATED_PATCH | TRANSDERMAL | Status: DC
  Administered 2017-01-31: 0.2 mg via TRANSDERMAL
  Filled 2017-01-31: qty 1

## 2017-01-31 MED ORDER — POTASSIUM CHLORIDE 10 MEQ/100ML IV SOLN
10.0000 meq | Freq: Once | INTRAVENOUS | Status: AC
  Administered 2017-01-31: 10 meq via INTRAVENOUS
  Filled 2017-01-31: qty 100

## 2017-01-31 MED ORDER — DIPHENHYDRAMINE HCL 50 MG/ML IJ SOLN
INTRAMUSCULAR | Status: AC
  Administered 2017-01-31: 25 mg via INTRAVENOUS
  Filled 2017-01-31: qty 1

## 2017-01-31 MED ORDER — ONDANSETRON HCL 4 MG/2ML IJ SOLN: 4.0000 mg | Freq: Four times a day (QID) | INTRAMUSCULAR | Status: DC | PRN

## 2017-01-31 MED ORDER — METOPROLOL TARTRATE 5 MG/5ML IV SOLN
INTRAVENOUS | Status: AC
  Administered 2017-01-31: 5 mg via INTRAVENOUS
  Filled 2017-01-31: qty 5

## 2017-01-31 MED ORDER — METOCLOPRAMIDE HCL 5 MG/ML IJ SOLN
10.0000 mg | Freq: Four times a day (QID) | INTRAMUSCULAR | Status: DC
  Administered 2017-01-31 – 2017-02-02 (×7): 10 mg via INTRAVENOUS
  Filled 2017-01-31 (×7): qty 2

## 2017-01-31 MED ORDER — METOCLOPRAMIDE HCL 5 MG/ML IJ SOLN
10.0000 mg | Freq: Once | INTRAMUSCULAR | Status: AC
  Administered 2017-01-31: 10 mg via INTRAVENOUS
  Filled 2017-01-31: qty 2

## 2017-01-31 MED ORDER — DICYCLOMINE HCL 10 MG/ML IM SOLN
10.0000 mg | Freq: Four times a day (QID) | INTRAMUSCULAR | Status: DC
  Administered 2017-01-31 – 2017-02-02 (×7): 10 mg via INTRAMUSCULAR
  Filled 2017-01-31: qty 1
  Filled 2017-01-31 (×2): qty 2
  Filled 2017-01-31 (×5): qty 1
  Filled 2017-01-31 (×2): qty 2
  Filled 2017-01-31 (×4): qty 1

## 2017-01-31 MED ORDER — BRIMONIDINE TARTRATE 0.15 % OP SOLN
1.0000 [drp] | Freq: Three times a day (TID) | OPHTHALMIC | Status: DC
  Administered 2017-01-31 – 2017-02-02 (×6): 1 [drp] via OPHTHALMIC
  Filled 2017-01-31: qty 5

## 2017-01-31 NOTE — ED Notes (Signed)
TTS in progress 

## 2017-01-31 NOTE — ED Notes (Signed)
restrants remove RN

## 2017-01-31 NOTE — ED Notes (Signed)
Pt thrashing around in bed, throwing himself to end of bed. Handrails up x 2. Family and staff attempting to get patient to lay back down, but pt continued to fight to stand up. Pt is blind and a high fall risk. Took 4 staff members to get patient to lay back down. Charge RN notified and patient moved to room closer to nurses station for observation.

## 2017-01-31 NOTE — Progress Notes (Signed)
ANTICOAGULATION CONSULT NOTE - Initial Consult  Pharmacy Consult for lovenox dosage adjustment Indication: VTE prophylaxis  Allergies  Allergen Reactions  . Donnatal [Pb-Hyoscy-Atropine-Scopol Er] Anaphylaxis    Reaction to GI cocktail  . Fish Allergy Anaphylaxis, Hives and Rash  . Haldol [Haloperidol Lactate] Other (See Comments)    Chest Pain  . Lidocaine Anaphylaxis    Reaction to GI cocktail  . Maalox [Calcium Carbonate Antacid] Anaphylaxis    Reaction to GI cocktail  . Aspirin Hives and Itching  . Bactrim [Sulfamethoxazole-Trimethoprim] Itching  . Doxycycline Hives and Itching  . Penicillins Hives and Itching    Has patient had a PCN reaction causing immediate rash, facial/tongue/throat swelling, SOB or lightheadedness with hypotension: yes Has patient had a PCN reaction causing severe rash involving mucus membranes or skin necrosis: yes Has patient had a PCN reaction that required hospitalization no Has patient had a PCN reaction occurring within the last 10 years: yes If all of the above answers are "NO", then may proceed with Cephalospor  . Phenergan [Promethazine Hcl] Nausea And Vomiting    Patient Measurements: Height: 6' (182.9 cm) Weight: (!) 306 lb 10.6 oz (139.1 kg) IBW/kg (Calculated) : 77.6   Vital Signs: Temp: 99.2 F (37.3 C) (06/23 1745) Temp Source: Oral (06/23 1745) BP: 174/99 (06/23 1700) Pulse Rate: 98 (06/23 1700)  Labs:  Recent Labs  01/31/17 1322  HGB 12.0*  HCT 33.2*  PLT 161  CREATININE 1.27*  TROPONINI <0.03    Estimated Creatinine Clearance: 105.1 mL/min (A) (by C-G formula based on SCr of 1.27 mg/dL (H)).   Medical History: Past Medical History:  Diagnosis Date  . Chronic abdominal pain   . Esophagitis   . Eye hemorrhage   . Gastritis   . Gastroparesis   . GERD (gastroesophageal reflux disease)   . Glaucoma   . Helicobacter pylori (H. pylori) infection   . Hiccups   . Hypertension   . Nausea and vomiting    chronic,  recurrent  . Type 2 diabetes mellitus with complications (HCC)    diagnosed around age 73    Medications:  Prescriptions Prior to Admission  Medication Sig Dispense Refill Last Dose  . amLODipine (NORVASC) 5 MG tablet Take 5 mg by mouth daily.   Past Week at Unknown time  . atorvastatin (LIPITOR) 10 MG tablet Take 10 mg by mouth daily.   Past Week at Unknown time  . cloNIDine (CATAPRES) 0.1 MG tablet Take 0.1 mg by mouth daily.    Past Week at Unknown time  . cyclobenzaprine (FLEXERIL) 10 MG tablet Take 10 mg by mouth at bedtime.    Past Week at Unknown time  . dicyclomine (BENTYL) 10 MG capsule Take 1 capsule (10 mg total) by mouth 4 (four) times daily -  before meals and at bedtime. 120 capsule 5 Past Week at Unknown time  . gabapentin (NEURONTIN) 600 MG tablet Take 600 mg by mouth 4 (four) times daily as needed (for neuropathy).    Past Week at Unknown time  . hydrochlorothiazide (HYDRODIURIL) 25 MG tablet Take 25 mg by mouth daily.   Past Week at Unknown time  . lisinopril (PRINIVIL,ZESTRIL) 40 MG tablet Take 40 mg by mouth daily.   Past Week at Unknown time  . pantoprazole (PROTONIX) 40 MG tablet TAKE 1 TABLET BY MOUTH TWICE DAILY BEFORE  A  MEAL 60 tablet 2 Past Week at Unknown time  . sucralfate (CARAFATE) 1 g tablet TAKE ONE TABLET BY MOUTH 4 TIMES  DAILY (Patient taking differently: Take 1 tablet by mouth twice daily) 120 tablet 0 Past Week at Unknown time  . brimonidine (ALPHAGAN) 0.15 % ophthalmic solution Place 1 drop into both eyes 3 (three) times daily.   Taking  . insulin detemir (LEVEMIR) 100 UNIT/ML injection Inject 30 Units into the skin at bedtime.   Taking  . insulin NPH-regular Human (NOVOLIN 70/30) (70-30) 100 UNIT/ML injection Inject 35 Units into the skin 2 (two) times daily.   Taking  . metoCLOPramide (REGLAN) 10 MG tablet Take 10 mg by mouth 4 (four) times daily -  before meals and at bedtime.   Taking  . saxagliptin HCl (ONGLYZA) 5 MG TABS tablet Take 5 mg by mouth  daily.   Taking  . timolol (BETIMOL) 0.5 % ophthalmic solution Place 1 drop into both eyes 2 (two) times daily.   Taking    Assessment: 45 yo M ordered lovenox for dvt pxl; bmi 41.5  Goal of Therapy: dvt pxl     Plan:  lovenox 0.5 mg /kg q24h = lovenox 70 mg sq q24h   Vonda Antigua 01/31/2017,5:50 PM

## 2017-01-31 NOTE — ED Notes (Signed)
162/100 MANUAL RIGHT SIDE 175/103 LARGER CUFF

## 2017-01-31 NOTE — ED Provider Notes (Signed)
[facility] DEPT Provider Note   CSN: [id number] Arrival date & time: 01/31/17  1313     History   Chief Complaint Chief Complaint  Patient presents with  . Abdominal Pain  Level V caveat acutely situation altered mental status. History is obtained from patient and from patient's sister who accompanies him  HPI Timothy Clark is a 46 y.o. male. She complains of diffuse abdominal pain for the past one week with vomiting multiple episodes per day including at least 7 episodes today. No fever. Symptoms typical of gastroparesis that he's had in the past. Patient was   hospitalized briefly at [facility] rocking him earlier this week, and released. His sister reports that he's been speaking of suicide this past week and tried to get out of a moving car while on the way here. Last bowel movement was today, normal  HPI  Past Medical History:  Diagnosis Date  . Chronic abdominal pain   . Esophagitis   . Eye hemorrhage   . Gastritis   . Gastroparesis   . GERD (gastroesophageal reflux disease)   . Glaucoma   . Helicobacter pylori (H. pylori) infection   . Hiccups   . Hypertension   . Nausea and vomiting    chronic, recurrent  . Type 2 diabetes mellitus with complications (HCC)    diagnosed around age 17    Patient Active Problem List   Diagnosis Date Noted  . Intractable nausea and vomiting 09/13/2016  . AKI (acute kidney injury) ([place]) 09/07/2016  . Insulin dependent diabetes mellitus ([name]) 09/07/2016  . Orthostatic syncope 09/05/2016  . Syncope and collapse   . 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 Diarrhea 09/27/2015  . Gastroparesis 02/17/2015  . Nausea & vomiting 02/17/2015  . Dehydration 02/17/2015  . Gastroparesis due to DM ([place]) 02/06/2015  . Intractable vomiting 01/15/2015  . Gastritis   . Essential hypertension 12/06/2014  . H. pylori infection 11/27/2014  . Acute esophagitis 10/29/2014  . Abdominal pain, epigastric   . Mucosal abnormality of stomach   . Mucosal  abnormality of esophagus   . Intractable vomiting with nausea 10/25/2014  . Elevated blood pressure 10/25/2014  . Nausea vomiting and diarrhea 10/25/2014  . DIABETIC FOOT ULCER 02/14/2010  . LIMB PAIN 12/11/2009  . UNSPECIFIED PERIPHERAL VASCULAR DISEASE 10/19/2009  . ERECTILE DYSFUNCTION, ORGANIC 10/19/2009  . NUMBNESS 07/20/2009  . Diabetes mellitus type 2 with complications, uncontrolled ([place]) 03/22/2009  . HYPERLIPIDEMIA 03/22/2009  . DEPRESSIVE DISORDER 03/22/2009    Past Surgical History:  Procedure Laterality Date  . CATARACT EXTRACTION W/PHACO Left 05/19/2016   Procedure: CATARACT EXTRACTION PHACO AND INTRAOCULAR LENS PLACEMENT LEFT EYE;  Surgeon: Tonny Branch, MD;  Location: [facility];  Service: Ophthalmology;  Laterality: Left;  CDE: 7.30  . CATARACT EXTRACTION W/PHACO Right 06/23/2016   Procedure: CATARACT EXTRACTION PHACO AND INTRAOCULAR LENS PLACEMENT RIGHT EYE CDE=9.87;  Surgeon: Tonny Branch, MD;  Location: [facility];  Service: Ophthalmology;  Laterality: Right;  right  . ESOPHAGOGASTRODUODENOSCOPY  2015   Dr. Britta Mccreedy  . ESOPHAGOGASTRODUODENOSCOPY N/A 10/26/2014   RMR: Distal esophagititis-likely reflux related although an element of pill induced injuruy no exclueded  status post biopsy. Diffusely abnormal gastric mucosa of uncertain signigicane -status post gastric biopsy. Focal area of excoriation in the cardia most consistant with a trauma of heaving.   [name] EYE SURGERY  2015   stent placed to left eye  . EYE SURGERY         Home Medications    Prior to Admission  medications   Medication Sig Start Date End Date Taking? Authorizing Provider  amLODipine (NORVASC) 5 MG tablet Take 5 mg by mouth daily.    [provider]  atorvastatin (LIPITOR) 10 MG tablet Take 10 mg by mouth daily.    [provider]  brimonidine (ALPHAGAN) 0.15 % ophthalmic solution Place 1 drop into both eyes 3 (three) times daily.    [provider]  cloNIDine (CATAPRES) 0.1 MG  tablet Take 0.1 mg by mouth 2 (two) times daily.    [provider]  cyclobenzaprine (FLEXERIL) 10 MG tablet Take 10 mg by mouth at bedtime.     [provider]  dicyclomine (BENTYL) 10 MG capsule Take 1 capsule (10 mg total) by mouth 4 (four) times daily -  before meals and at bedtime. 12/26/16   Annitta Needs, NP  gabapentin (NEURONTIN) 600 MG tablet Take 600 mg by mouth 4 (four) times daily as needed (for neuropathy).     [provider]  hydrochlorothiazide (HYDRODIURIL) 25 MG tablet Take 25 mg by mouth daily.    [provider]  insulin detemir (LEVEMIR) 100 UNIT/ML injection Inject 30 Units into the skin at bedtime.    [provider]  insulin NPH-regular Human (NOVOLIN 70/30) (70-30) 100 UNIT/ML injection Inject 35 Units into the skin 2 (two) times daily.    [provider]  lisinopril (PRINIVIL,ZESTRIL) 40 MG tablet Take 40 mg by mouth daily.    [provider]  metoCLOPramide (REGLAN) 10 MG tablet Take 10 mg by mouth 4 (four) times daily -  before meals and at bedtime.    [provider]  pantoprazole (PROTONIX) 40 MG tablet TAKE 1 TABLET BY MOUTH TWICE DAILY BEFORE  A  MEAL 12/16/16   Carlis Stable, NP  saxagliptin HCl (ONGLYZA) 5 MG TABS tablet Take 5 mg by mouth daily.    [provider]  sucralfate (CARAFATE) 1 g tablet TAKE ONE TABLET BY MOUTH 4 TIMES DAILY Patient taking differently: Take 1 tablet by mouth twice daily 03/12/16   Annitta Needs, NP  timolol (BETIMOL) 0.5 % ophthalmic solution Place 1 drop into both eyes 2 (two) times daily.    [provider]    Family History Family History  Problem Relation Age of Onset  . Ovarian cancer Mother   . Cervical cancer Sister   . Diabetes Sister   . Colon cancer Neg Hx     Social History Social History  Substance Use Topics  . Smoking status: Never Smoker  . Smokeless tobacco: Never Used  . Alcohol use No     Allergies   Donnatal  [pb-hyoscy-atropine-scopol er]; Fish allergy; Haldol [haloperidol lactate]; Lidocaine; Maalox [calcium carbonate antacid]; Aspirin; Bactrim [sulfamethoxazole-trimethoprim]; Doxycycline; Penicillins; Phenergan [promethazine hcl]; and Zofran [ondansetron hcl] Patient reports Zofran causes headache ups and Phenergan "makes me vomit more"   Review of Systems Review of Systems  Unable to perform ROS: Acuity of condition  Gastrointestinal: Positive for abdominal pain and vomiting.  Skin: Positive for wound.       Chronic wound on left foot  Psychiatric/Behavioral: Positive for suicidal ideas.     Physical Exam Updated Vital Signs BP (!) 186/97 (BP Location: Left Arm)   Pulse 92   Temp 99.1 F (37.3 C) (Oral)   Resp (!) 26   Ht 6' (1.829 m)   Wt 136.1 kg (300 lb)   SpO2 100%   BMI 40.69 kg/m   Physical Exam  Constitutional:  Agitated,  ill-appearing  HENT:  Head: Normocephalic and atraumatic.  Mucous membranes dry  Eyes: Conjunctivae are normal. Pupils are equal, round, and reactive to light.  Neck: Neck supple. No tracheal deviation present. No thyromegaly present.  Cardiovascular: Normal rate and regular rhythm.   No murmur heard. Pulmonary/Chest: Effort normal and breath sounds normal.  Abdominal: Soft. He exhibits no distension. There is no tenderness.  Morbidly obese  Genitourinary: Penis normal.  Genitourinary Comments: Normal male genitalia  Musculoskeletal: Normal range of motion. He exhibits no edema or tenderness.  Dime-sized ulcer at dorsum of left foot. No drainage. Appears clean. Chronic appearing brawny skin changes to bilateral lower extremities below the knees. All 4 extremities without deformity or swelling, neurovascularly intact  Neurological: He is alert. Coordination normal.  Motor strength 5 over 5 overall.  Skin: Skin is warm and dry. No rash noted.  Psychiatric: He has a normal mood and affect.  Nursing note and vitals reviewed.    ED Treatments /  Results  Labs (all labs ordered are listed, but only abnormal results are displayed) Labs Reviewed  COMPREHENSIVE METABOLIC PANEL - Abnormal; Notable for the following:       Result Value   Potassium 3.4 (*)    Glucose, Bld 221 (*)    Creatinine, Ser 1.27 (*)    Albumin 3.4 (*)    ALT 14 (*)    All other components within normal limits  CBC - Abnormal; Notable for the following:    RBC 3.89 (*)    Hemoglobin 12.0 (*)    HCT 33.2 (*)    MCHC 36.1 (*)    All other components within normal limits  URINALYSIS, ROUTINE W REFLEX MICROSCOPIC - Abnormal; Notable for the following:    Glucose, UA 150 (*)    Hgb urine dipstick SMALL (*)    Protein, ur >=300 (*)    Bacteria, UA MANY (*)    All other components within normal limits  LIPASE, BLOOD  TROPONIN I  RAPID URINE DRUG SCREEN, HOSP PERFORMED    EKG  EKG Interpretation  Date/Time:  Saturday January 31 2017 15:06:08 EDT Ventricular Rate:  95 PR Interval:    QRS Duration: 79 QT Interval:  371 QTC Calculation: 467 R Axis:   59 Text Interpretation:  Sinus rhythm Low voltage, precordial leads No significant change since last tracing Confirmed by Orlie Dakin [phone]) on 01/31/2017 3:09:54 PM       Radiology No results found.  Procedures Procedures (including critical care time)  Medications Ordered in ED Medications  ondansetron (ZOFRAN) injection 4 mg (not administered)  metoCLOPramide (REGLAN) injection 10 mg (10 mg Intravenous Given 01/31/17 1416)  sodium chloride 0.9 % bolus 2,000 mL (2,000 mLs Intravenous New Bag/Given 01/31/17 1456)  diphenhydrAMINE (BENADRYL) injection 25 mg (25 mg Intravenous Given 01/31/17 1450)  LORazepam (ATIVAN) injection 1 mg (1 mg Intravenous Given 01/31/17 1451)   2:55 PM After treatment with IV Reglan patient stood up and stated "people are looking at me "I've got a get out of here" he became combative and flailing all extremities. He required physical restraint to prevent harm to himself and  staff. IV Benadryl and IV Ativan ordered. He was involuntarily committed by me as he was attempting to leave the emergency department and is not of sound mind to make that decision At 3:05 PM patient is resting more comfortably.  4:45 PM patient is resting comfortably in bed. He no longer requires restraint. Restraints been removed. He states "my stomach hurts."  Intravenous potassium ordered by me. I consulted Dr. Nehemiah Settle who evaluated patient in emergency department arrange for admission. I also consultedTTS at Dr. Glenna Durand request who will evaluate him for possible psychiatric bed placement while he is on medical unit.  Results for orders placed or performed during the hospital encounter of 01/31/17  Lipase, blood  Result Value Ref Range   Lipase 31 11 - 51 U/L  Comprehensive metabolic panel  Result Value Ref Range   Sodium 136 135 - 145 mmol/L   Potassium 3.4 (L) 3.5 - 5.1 mmol/L   Chloride 103 101 - 111 mmol/L   CO2 24 22 - 32 mmol/L   Glucose, Bld 221 (H) 65 - 99 mg/dL   BUN 14 6 - 20 mg/dL   Creatinine, Ser 1.27 (H) 0.61 - 1.24 mg/dL   Calcium 9.0 8.9 - 10.3 mg/dL   Total Protein 6.9 6.5 - 8.1 g/dL   Albumin 3.4 (L) 3.5 - 5.0 g/dL   AST 20 15 - 41 U/L   ALT 14 (L) 17 - 63 U/L   Alkaline Phosphatase 58 38 - 126 U/L   Total Bilirubin 0.7 0.3 - 1.2 mg/dL   GFR calc non Af Amer >60 >60 mL/min   GFR calc Af Amer >60 >60 mL/min   Anion gap 9 5 - 15  CBC  Result Value Ref Range   WBC 8.8 4.0 - 10.5 K/uL   RBC 3.89 (L) 4.22 - 5.81 MIL/uL   Hemoglobin 12.0 (L) 13.0 - 17.0 g/dL   HCT 33.2 (L) 39.0 - 52.0 %   MCV 85.3 78.0 - 100.0 fL   MCH 30.8 26.0 - 34.0 pg   MCHC 36.1 (H) 30.0 - 36.0 g/dL   RDW 12.9 11.5 - 15.5 %   Platelets 161 150 - 400 K/uL  Urinalysis, Routine w reflex microscopic  Result Value Ref Range   Color, Urine YELLOW YELLOW   APPearance CLEAR CLEAR   Specific Gravity, Urine 1.010 1.005 - 1.030   pH 8.0 5.0 - 8.0   Glucose, UA 150 (A) NEGATIVE mg/dL   Hgb  urine dipstick SMALL (A) NEGATIVE   Bilirubin Urine NEGATIVE NEGATIVE   Ketones, ur NEGATIVE NEGATIVE mg/dL   Protein, ur >=300 (A) NEGATIVE mg/dL   Nitrite NEGATIVE NEGATIVE   Leukocytes, UA NEGATIVE NEGATIVE   RBC / HPF 6-30 0 - 5 RBC/hpf   WBC, UA 0-5 0 - 5 WBC/hpf   Bacteria, UA MANY (A) NONE SEEN   Squamous Epithelial / LPF NONE SEEN NONE SEEN   Sperm, UA PRESENT   Troponin I  Result Value Ref Range   Troponin I <0.03 <0.03 ng/mL   Dg Abd Acute W/chest  Result Date: 01/31/2017 CLINICAL DATA:  Pain and vomiting. EXAM: DG ABDOMEN ACUTE W/ 1V CHEST COMPARISON:  None. FINDINGS: There is no evidence of dilated bowel loops or free intraperitoneal air. No radiopaque calculi or other significant radiographic abnormality is seen. Heart size and mediastinal contours are within normal limits. Both lungs are clear. IMPRESSION: Negative abdominal radiographs.  No acute cardiopulmonary disease. Electronically Signed   By: Dorise Bullion III M.D   On: 01/31/2017 15:34  X-rays viewed by me Initial Impression / Assessment and Plan / ED Course  I have reviewed the triage vital signs and the nursing notes.  Pertinent labs & imaging results that were available during my care of the patient were reviewed by me and considered in my medical decision making (see chart for details).  Patient requires intravenous hydration, observation for abdominal pain and psychosis and hemodynamic monitoring.  Final Clinical Impressions(s) / ED Diagnoses  Diagnosis #1 diffuse abdominal pain #2 vomiting #3 acute psychosis #4 elevated blood pressure #5 hypokalemia #6 hyperglycemia Final diagnoses:  None  CRITICAL CARE Performed by: Orlie Dakin Total critical care time: 45 minutes Critical care time was exclusive of separately billable procedures and treating other patients. Critical care was necessary to treat or prevent imminent or life-threatening deterioration. Critical care was time spent personally  by me on the following activities: development of treatment plan with patient and/or surrogate as well as nursing, discussions with consultants, evaluation of patient's response to treatment, examination of patient, obtaining history from patient or surrogate, ordering and performing treatments and interventions, ordering and review of laboratory studies, ordering and review of radiographic studies, pulse oximetry and re-evaluation of patient's condition.  New Prescriptions New Prescriptions   No medications on file     Orlie Dakin, MD 01/31/17 [phone]

## 2017-01-31 NOTE — ED Notes (Signed)
Timothy Clark [phone] (cell).

## 2017-01-31 NOTE — ED Notes (Signed)
Pt has been getting out of bed, stating I am seeing people, and they are looking at me."  MD requested 4 pt soft restraints.

## 2017-01-31 NOTE — ED Triage Notes (Addendum)
Pt with gastroparesis, n/v/abd pain for past week. Pt is very anxious and will not sit still in wheelchair or bed. When pt entered ER, he was walking with two family members, stumbling through doors.  Nurse first (myself) went to retrieve wheelchair and pt stated that he was "cramping up" and began leaning forward.  Security and myself helped to balance pt and assisted him to wheelchair, no falls occurred.  Pt continually tried to readjust and/or stand up while being pushed down hallway in wheelchair.  Multiple staff members had to help pt get into bed.

## 2017-01-31 NOTE — BH Assessment (Signed)
Tele Assessment Note   Timothy Clark is a 46 y.o. male who presented voluntarily to [facility] with complaint of abdominal pain and other physical issues.  Per hospital report, Pt expressed suicidal ideation last week and tried to leave a moving vehicle today.  Pt provided history.  Pt lives with a parent and sister.  He is on disability due to vision issues (blind in right eye, poor vision in left).  Pt stated that he has felt despondent over his physical condition (gastroparesis, Type 2 diabetes, chronic abdominal pain), and that he may have made a statement about wanting to die last week, but he denied current suicidal ideation or past suicide attempt.  Pt also denied homicidal ideation, auditory/visual hallucination, self-injurious behavior, and substance use concerns.  Regarding leaving the vehicle earlier today, Pt stated that he needed to stand up to relieve pain and asked the driver to pull over so he could stand.  Pt stated that he has not been treated for depression, and that he does not have any psychiatric/therapeutic provider at this time.  Pt said he was not interested in inpatient care.    Attempts to reach Pt's sister were unsuccessful.    During assessment, Pt was calm and cooperative.  Pt was dressed in scrubs.  He reported his mood as "OK," although it was apparent he felt irritable and frustrated with physical pain.  Pt endorsed despondency and past fleeting suicidal ideation linked to physical pain.  Pt denied current suicidal ideation, any homicidal ideation, hallucination, self-injurious behavior, substance use.  Pt's speech was normal in rate, rhythm, and volume.  Thought processes were within normal range, and thought content was logical and goal-oriented.  There was no evidence of delusion.  Pt's memory and concentration were intact.  Impulse control, judgment, and insight were fair.    Consulted with Starleen Arms NP who stated that Pt does not meet inpatient criteria.  The  recommendation is that Pt receive outpatient treatment to help cope with stressors arising from physical pain.  Diagnosis: Adjustment Disorder, Depressed Type  Past Medical History:  Past Medical History:  Diagnosis Date  . Chronic abdominal pain   . Esophagitis   . Eye hemorrhage   . Gastritis   . Gastroparesis   . GERD (gastroesophageal reflux disease)   . Glaucoma   . Helicobacter pylori (H. pylori) infection   . Hiccups   . Hypertension   . Nausea and vomiting    chronic, recurrent  . Type 2 diabetes mellitus with complications (HCC)    diagnosed around age 62    Past Surgical History:  Procedure Laterality Date  . CATARACT EXTRACTION W/PHACO Left 05/19/2016   Procedure: CATARACT EXTRACTION PHACO AND INTRAOCULAR LENS PLACEMENT LEFT EYE;  Surgeon: Tonny Branch, MD;  Location: [facility];  Service: Ophthalmology;  Laterality: Left;  CDE: 7.30  . CATARACT EXTRACTION W/PHACO Right 06/23/2016   Procedure: CATARACT EXTRACTION PHACO AND INTRAOCULAR LENS PLACEMENT RIGHT EYE CDE=9.87;  Surgeon: Tonny Branch, MD;  Location: [facility];  Service: Ophthalmology;  Laterality: Right;  right  . ESOPHAGOGASTRODUODENOSCOPY  2015   Dr. Britta Mccreedy  . ESOPHAGOGASTRODUODENOSCOPY N/A 10/26/2014   RMR: Distal esophagititis-likely reflux related although an element of pill induced injuruy no exclueded  status post biopsy. Diffusely abnormal gastric mucosa of uncertain signigicane -status post gastric biopsy. Focal area of excoriation in the cardia most consistant with a trauma of heaving.   [name] EYE SURGERY  2015   stent placed to left eye  . EYE  SURGERY      Family History:  Family History  Problem Relation Age of Onset  . Ovarian cancer Mother   . Cervical cancer Sister   . Diabetes Sister   . Colon cancer Neg Hx     Social History:  reports that he has never smoked. He has never used smokeless tobacco. He reports that he does not drink alcohol or use drugs.  Additional Social History:  Alcohol / Drug  Use Pain Medications: See MAR Prescriptions: See MAR Over the Counter: See MAR History of alcohol / drug use?: No history of alcohol / drug abuse  CIWA: CIWA-Ar BP: (!) 174/99 Pulse Rate: 98 COWS:    PATIENT STRENGTHS: (choose at least two) Average or above average intelligence Communication skills  Allergies:  Allergies  Allergen Reactions  . Donnatal [Pb-Hyoscy-Atropine-Scopol Er] Anaphylaxis    Reaction to GI cocktail  . Fish Allergy Anaphylaxis, Hives and Rash  . Haldol [Haloperidol Lactate] Other (See Comments)    Chest Pain  . Lidocaine Anaphylaxis    Reaction to GI cocktail  . Maalox [Calcium Carbonate Antacid] Anaphylaxis    Reaction to GI cocktail  . Aspirin Hives and Itching  . Bactrim [Sulfamethoxazole-Trimethoprim] Itching  . Doxycycline Hives and Itching  . Penicillins Hives and Itching    Has patient had a PCN reaction causing immediate rash, facial/tongue/throat swelling, SOB or lightheadedness with hypotension: yes Has patient had a PCN reaction causing severe rash involving mucus membranes or skin necrosis: yes Has patient had a PCN reaction that required hospitalization no Has patient had a PCN reaction occurring within the last 10 years: yes If all of the above answers are "NO", then may proceed with Cephalospor  . Phenergan [Promethazine Hcl] Nausea And Vomiting    Home Medications:  (Not in a hospital admission)  OB/GYN Status:  No LMP for male patient.  General Assessment Data Location of Assessment: [facility] TTS Assessment: In system Is this a Tele or Face-to-Face Assessment?: Tele Assessment Is this an Initial Assessment or a Re-assessment for this encounter?: Initial Assessment Marital status: Single Is patient pregnant?: No Pregnancy Status: No Living Arrangements: Parent, Other relatives (Father, sister) Can pt return to current living arrangement?: Yes Admission Status: Voluntary Is patient capable of signing voluntary admission?:  Yes Referral Source: MD Insurance type: [facility] MCR     Crisis Care Plan Living Arrangements: Parent, Other relatives (Father, sister) Name of Psychiatrist: None currently Name of Therapist: None currently  Education Status Is patient currently in school?: No  Risk to self with the past 6 months Suicidal Ideation: No-Not Currently/Within Last 6 Months Has patient been a risk to self within the past 6 months prior to admission? : No Suicidal Intent: No Has patient had any suicidal intent within the past 6 months prior to admission? : No Is patient at risk for suicide?: No Suicidal Plan?: No-Not Currently/Within Last 6 Months Has patient had any suicidal plan within the past 6 months prior to admission? : No Access to Means: No What has been your use of drugs/alcohol within the last 12 months?: Denied Previous Attempts/Gestures: No Intentional Self Injurious Behavior: None Family Suicide History: Unknown Recent stressful life event(s): Recent negative physical changes (Issues with gastroparesis) Persecutory voices/beliefs?: No Depression: Yes Depression Symptoms: Despondent Substance abuse history and/or treatment for substance abuse?: No Suicide prevention information given to non-admitted patients: Not applicable  Risk to Others within the past 6 months Homicidal Ideation: No Does patient have any lifetime risk of violence  toward others beyond the six months prior to admission? : No Thoughts of Harm to Others: No Current Homicidal Intent: No Current Homicidal Plan: No Access to Homicidal Means: No History of harm to others?: No Assessment of Violence: None Noted Does patient have access to weapons?: No Criminal Charges Pending?: No Does patient have a court date: No Is patient on probation?: No  Psychosis Hallucinations: None noted Delusions: None noted  Mental Status Report Appearance/Hygiene: In scrubs, Unremarkable Eye Contact: Other (Comment) (Blind in one eye;  poor vision in other) Motor Activity: Unremarkable Speech: Logical/coherent Level of Consciousness: Alert Mood: Sad, Preoccupied, Irritable Affect: Appropriate to circumstance (physical discomfort) Anxiety Level: None Thought Processes: Coherent, Relevant Judgement: Partial Orientation: Person, Place, Time, Situation Obsessive Compulsive Thoughts/Behaviors: None  Cognitive Functioning Concentration: Good Memory: Remote Intact, Recent Intact IQ: Average Insight: Fair Impulse Control: Fair Appetite: Fair Sleep: No Change Vegetative Symptoms: None  ADLScreening [facility] Assessment Services) Patient's cognitive ability adequate to safely complete daily activities?: Yes Patient able to express need for assistance with ADLs?: Yes Independently performs ADLs?: Yes (appropriate for developmental age)  Prior Inpatient Therapy Prior Inpatient Therapy: No  Prior Outpatient Therapy Prior Outpatient Therapy: No Does patient have an ACCT team?: No Does patient have Intensive In-House Services?  : No Does patient have [facility] services? : No Does patient have P4CC services?: No  ADL Screening (condition at time of admission) Patient's cognitive ability adequate to safely complete daily activities?: Yes Is the patient deaf or have difficulty hearing?: No Does the patient have difficulty seeing, even when wearing glasses/contacts?: Yes (Blind in right eye, difficulty seeing with left eye) Does the patient have difficulty concentrating, remembering, or making decisions?: No Patient able to express need for assistance with ADLs?: Yes Does the patient have difficulty dressing or bathing?: No Independently performs ADLs?: Yes (appropriate for developmental age) Does the patient have difficulty walking or climbing stairs?: No Weakness of Legs: None Weakness of Arms/Hands: None  Home Assistive Devices/Equipment Home Assistive Devices/Equipment: None  Therapy Consults (therapy consults require  a physician order) PT Evaluation Needed: No OT Evalulation Needed: No SLP Evaluation Needed: No Abuse/Neglect Assessment (Assessment to be complete while patient is alone) Physical Abuse: Denies Verbal Abuse: Denies Sexual Abuse: Denies Exploitation of patient/patient's resources: Denies Self-Neglect: Denies Values / Beliefs Cultural Requests During Hospitalization: None Spiritual Requests During Hospitalization: None Consults Spiritual Care Consult Needed: No Social Work Consult Needed: No Regulatory affairs officer (For Healthcare) Does Patient Have a Medical Advance Directive?: No Would patient like information on creating a medical advance directive?: No - Patient declined    Additional Information 1:1 In Past 12 Months?: No CIRT Risk: No Elopement Risk: No Does patient have medical clearance?: Yes     Disposition:  Disposition Initial Assessment Completed for this Encounter: Yes Disposition of Patient: Outpatient treatment (Per L. Romilda Garret, NP Pt meets o/p criteria) Type of outpatient treatment: Adult  Marlowe Aschoff 01/31/2017 5:27 PM

## 2017-01-31 NOTE — ED Notes (Signed)
Ok to hold Zofran per MD.

## 2017-01-31 NOTE — H&P (Signed)
History and Physical  Timothy Clark LYY:[id number] DOB: May 01, 1971 DOA: 01/31/2017  Referring physician: Dr Winfred Leeds, ED physician PCP: Tawni Carnes, PA-C  Outpatient Specialists:   Dr Gala Romney (GI)  Patient Coming From: home  Chief Complaint: vomiting  HPI: Timothy Clark is a 46 y.o. male with a history of diabetes, GERD, gastroparesis, hypertension. Patient has had several episode of gastroparesis requiring hospitalization. He was recently hospitalized at [facility] earlier this week. He stayed there for 2 days and then was discharged, although he states that he wasn't fully improved. He began to have increasing vomiting, then presented here for evaluation. Symptoms exacerbated by oral food and liquid intake improved stomach rest, although the patient continues to retch. Patient states that he continued to take his insulin, however was unable to take his oral medications. Patient does complain of nonradiating abdominal pain in the epigastric and left upper quadrant area. The patient states that this is consistent with his normal gastroparesis pain.  Per ER records, sister states that he has had suicidal ideation earlier in the week, and even attempted to get out of a moving car while being transported to the hospital. In the emergency department, per EDP, the patient stood up on his bed and stated that he needed to "get out of here" and that people were staring at him. The patient had to be restrained physically and chemically with Ativan. The EP determined that the patient was unsafe and involuntarily committed the patient. This took place shortly after IV Reglan was given to the patient, although the patient has been receiving Reglan consistently in the outpatient setting and has never had any reactions prior to this.  Emergency Department Course: Patient given 2 L IV fluids, Zofran, 10 mg of Reglan, 1 mg of Ativan, 25 mg of Benadryl. Patient has had 7 episodes of vomiting  in the emergency department. Labs showed mild hypokalemia, AKI. Abdominal series was negative  Review of Systems:   Pt denies any fevers, chills, diarrhea, constipation, abdominal pain, shortness of breath, dyspnea on exertion, orthopnea, cough, wheezing, palpitations, headache, vision changes, lightheadedness, dizziness, melena, rectal bleeding.  Review of systems are otherwise negative  Past Medical History:  Diagnosis Date  . Chronic abdominal pain   . Esophagitis   . Eye hemorrhage   . Gastritis   . Gastroparesis   . GERD (gastroesophageal reflux disease)   . Glaucoma   . Helicobacter pylori (H. pylori) infection   . Hiccups   . Hypertension   . Nausea and vomiting    chronic, recurrent  . Type 2 diabetes mellitus with complications (HCC)    diagnosed around age 79   Past Surgical History:  Procedure Laterality Date  . CATARACT EXTRACTION W/PHACO Left 05/19/2016   Procedure: CATARACT EXTRACTION PHACO AND INTRAOCULAR LENS PLACEMENT LEFT EYE;  Surgeon: Tonny Branch, MD;  Location: [facility];  Service: Ophthalmology;  Laterality: Left;  CDE: 7.30  . CATARACT EXTRACTION W/PHACO Right 06/23/2016   Procedure: CATARACT EXTRACTION PHACO AND INTRAOCULAR LENS PLACEMENT RIGHT EYE CDE=9.87;  Surgeon: Tonny Branch, MD;  Location: [facility];  Service: Ophthalmology;  Laterality: Right;  right  . ESOPHAGOGASTRODUODENOSCOPY  2015   Dr. Britta Mccreedy  . ESOPHAGOGASTRODUODENOSCOPY N/A 10/26/2014   RMR: Distal esophagititis-likely reflux related although an element of pill induced injuruy no exclueded  status post biopsy. Diffusely abnormal gastric mucosa of uncertain signigicane -status post gastric biopsy. Focal area of excoriation in the cardia most consistant with a trauma of heaving.   [name] EYE  SURGERY  2015   stent placed to left eye  . EYE SURGERY     Social History:  reports that he has never smoked. He has never used smokeless tobacco. He reports that he does not drink alcohol or use drugs. Patient lives  at [place]  . Donnatal [Pb-Hyoscy-Atropine-Scopol Er] Anaphylaxis    Reaction to GI cocktail  . Fish Allergy Anaphylaxis, Hives and Rash  . Haldol [Haloperidol Lactate] Other (See Comments)    Chest Pain  . Lidocaine Anaphylaxis    Reaction to GI cocktail  . Maalox [Calcium Carbonate Antacid] Anaphylaxis    Reaction to GI cocktail  . Aspirin Hives and Itching  . Bactrim [Sulfamethoxazole-Trimethoprim] Itching  . Doxycycline Hives and Itching  . Penicillins Hives and Itching    Has patient had a PCN reaction causing immediate rash, facial/tongue/throat swelling, SOB or lightheadedness with hypotension: yes Has patient had a PCN reaction causing severe rash involving mucus membranes or skin necrosis: yes Has patient had a PCN reaction that required hospitalization no Has patient had a PCN reaction occurring within the last 10 years: yes If all of the above answers are "NO", then may proceed with Cephalospor  . Phenergan [Promethazine Hcl] Nausea And Vomiting  . Zofran [Ondansetron Hcl] Other (See Comments)    Hiccups     Family History  Problem Relation Age of Onset  . Ovarian cancer Mother   . Cervical cancer Sister   . Diabetes Sister   . Colon cancer Neg Hx       Prior to Admission medications   Medication Sig Start Date End Date Taking? Authorizing Provider  amLODipine (NORVASC) 5 MG tablet Take 5 mg by mouth daily.    [provider]  atorvastatin (LIPITOR) 10 MG tablet Take 10 mg by mouth daily.    [provider]  brimonidine (ALPHAGAN) 0.15 % ophthalmic solution Place 1 drop into both eyes 3 (three) times daily.    [provider]  cloNIDine (CATAPRES) 0.1 MG tablet Take 0.1 mg by mouth 2 (two) times daily.    [provider]  cyclobenzaprine (FLEXERIL) 10 MG tablet Take 10 mg by mouth at bedtime.     [provider]  dicyclomine (BENTYL) 10 MG capsule Take 1 capsule (10 mg total) by mouth 4  (four) times daily -  before meals and at bedtime. 12/26/16   Annitta Needs, NP  gabapentin (NEURONTIN) 600 MG tablet Take 600 mg by mouth 4 (four) times daily as needed (for neuropathy).     [provider]  hydrochlorothiazide (HYDRODIURIL) 25 MG tablet Take 25 mg by mouth daily.    [provider]  insulin detemir (LEVEMIR) 100 UNIT/ML injection Inject 30 Units into the skin at bedtime.    [provider]  insulin NPH-regular Human (NOVOLIN 70/30) (70-30) 100 UNIT/ML injection Inject 35 Units into the skin 2 (two) times daily.    [provider]  lisinopril (PRINIVIL,ZESTRIL) 40 MG tablet Take 40 mg by mouth daily.    [provider]  metoCLOPramide (REGLAN) 10 MG tablet Take 10 mg by mouth 4 (four) times daily -  before meals and at bedtime.    [provider]  pantoprazole (PROTONIX) 40 MG tablet TAKE 1 TABLET BY MOUTH TWICE DAILY BEFORE  A  MEAL 12/16/16   Carlis Stable, NP  saxagliptin HCl (ONGLYZA) 5 MG TABS tablet Take 5 mg by mouth daily.    [provider]  sucralfate (CARAFATE) 1 g tablet TAKE ONE TABLET BY MOUTH 4 TIMES DAILY Patient taking differently: Take 1 tablet by mouth twice daily 03/12/16   Annitta Needs, NP  timolol (BETIMOL) 0.5 % ophthalmic solution Place 1 drop into both eyes 2 (two) times daily.    [provider]    Physical Exam: BP (!) 174/99   Pulse 98   Temp 99.1 F (37.3 C) (Oral)   Resp 20   Ht 6' (1.829 m)   Wt 136.1 kg (300 lb)   SpO2 99%   BMI 40.69 kg/m   General: Middle-aged black male. Awake and alert and oriented x3. No acute cardiopulmonary distress.  HEENT: Normocephalic atraumatic.  Right and left ears normal in appearance.  Pupils equal, round, reactive to light. Extraocular muscles are intact. Sclerae anicteric and noninjected.  Dry mucosal membranes. No mucosal lesions.  Neck: Neck supple without lymphadenopathy. No carotid bruits. No masses palpated.  Cardiovascular: Regular  rate with normal S1-S2 sounds. No murmurs, rubs, gallops auscultated. No JVD.  Respiratory: Good respiratory effort with no wheezes, rales, rhonchi. Lungs clear to auscultation bilaterally.  No accessory muscle use. Abdomen: Obese. Soft, nondistended. Tender to the epigastric and left upper quadrant area. No rebound or guarding. Active bowel sounds. No masses or hepatosplenomegaly  Skin: No rashes, lesions, or ulcerations.  Dry, warm to touch. 2+ dorsalis pedis and radial pulses. Musculoskeletal: No calf or leg pain. All major joints not erythematous nontender.  No upper or lower joint deformation.  Good ROM.  No contractures  Psychiatric: Intact judgment and insight. Pleasant and cooperative. Neurologic: No focal neurological deficits. Strength is 5/5 and symmetric in upper and lower extremities.  Cranial nerves II through XII are grossly intact.           Labs on Admission: I have personally reviewed following labs and imaging studies  CBC:  Recent Labs Lab 01/31/17 1322  WBC 8.8  HGB 12.0*  HCT 33.2*  MCV 85.3  PLT 191   Basic Metabolic Panel:  Recent Labs Lab 01/31/17 1322  NA 136  K 3.4*  CL 103  CO2 24  GLUCOSE 221*  BUN 14  CREATININE 1.27*  CALCIUM 9.0   GFR: Estimated Creatinine Clearance: 103.8 mL/min (A) (by C-G formula based on SCr of 1.27 mg/dL (H)). Liver Function Tests:  Recent Labs Lab 01/31/17 1322  AST 20  ALT 14*  ALKPHOS 58  BILITOT 0.7  PROT 6.9  ALBUMIN 3.4*    Recent Labs Lab 01/31/17 1322  LIPASE 31   No results for input(s): AMMONIA in the last 168 hours. Coagulation Profile: No results for input(s): INR, PROTIME in the last 168 hours. Cardiac Enzymes:  Recent Labs Lab 01/31/17 1322  TROPONINI <0.03   BNP (last 3 results) No results for input(s): PROBNP in the last 8760 hours. HbA1C: No results for input(s): HGBA1C in the last 72 hours. CBG: No results for input(s): GLUCAP in the last 168 hours. Lipid Profile: No  results for input(s): CHOL, HDL, LDLCALC, TRIG, CHOLHDL, LDLDIRECT in the last 72 hours. Thyroid Function Tests: No results for input(s): TSH, T4TOTAL, FREET4, T3FREE, THYROIDAB in the last 72 hours. Anemia Panel: No results for input(s): VITAMINB12, FOLATE, FERRITIN, TIBC, IRON, RETICCTPCT in the last 72 hours. Urine analysis:    Component Value Date/Time   COLORURINE YELLOW 01/31/2017 1410   APPEARANCEUR CLEAR 01/31/2017 1410   LABSPEC 1.010 01/31/2017 1410   PHURINE 8.0 01/31/2017 1410   GLUCOSEU 150 (A) 01/31/2017 1410  HGBUR SMALL (A) 01/31/2017 1410   BILIRUBINUR NEGATIVE 01/31/2017 1410   KETONESUR NEGATIVE 01/31/2017 1410   PROTEINUR >=300 (A) 01/31/2017 1410   UROBILINOGEN 0.2 06/07/2015 1353   NITRITE NEGATIVE 01/31/2017 1410   LEUKOCYTESUR NEGATIVE 01/31/2017 1410   Sepsis Labs: @LABRCNTIP (procalcitonin:4,lacticidven:4) )No results found for this or any previous visit (from the past 240 hour(s)).   Radiological Exams on Admission: Dg Abd Acute W/chest  Result Date: 01/31/2017 CLINICAL DATA:  Pain and vomiting. EXAM: DG ABDOMEN ACUTE W/ 1V CHEST COMPARISON:  None. FINDINGS: There is no evidence of dilated bowel loops or free intraperitoneal air. No radiopaque calculi or other significant radiographic abnormality is seen. Heart size and mediastinal contours are within normal limits. Both lungs are clear. IMPRESSION: Negative abdominal radiographs.  No acute cardiopulmonary disease. Electronically Signed   By: Dorise Bullion III M.D   On: 01/31/2017 15:34    EKG: Independently reviewed. Sinus rhythm. No acute ST changes  Assessment/Plan: Principal Problem:   Intractable vomiting Active Problems:   Essential hypertension   Gastroparesis due to DM (HCC)   AKI (acute kidney injury) ([place])   Insulin dependent diabetes mellitus ([place])   Psychosis   Hypokalemia    This patient was discussed with the ED physician, including pertinent vitals, physical exam findings, labs,  and imaging.  We also discussed care given by the ED provider.  #1 intractable vomiting  Admit to stepdown (due to nursing needs)  Continue IV fluids  Nothing by mouth  Reglan 10 mg every 6 hours  Benadryl IV every 6 hours when necessary  Zofran IV when necessary #2 gastroparesis secondary to diabetes  Continue Reglan #3 essential hypertension  We'll place the patient on metoprolol IV  Change Catapres to patch #4 AK I  IV fluid bolus  Continue IV fluids  Recheck creatinine in the morning #5 psychosis  Telepsych #6 hypokalemia  Patient received potassium in the emergency department  40 mg of K+ per liter and his IV fluids #7 insulin-dependent diabetes  As patient is nothing by mouth, will continue patient's long-acting insulin to maintain basal insulin  Increase Levemir to 50 units at night (patient currently on 30 units, but is taking 35 units of NPH twice a day, so will increase Levemir to maintain long-acting basal insulin)  Slide scale insulin every 4 hours  CBGs every 4 hours  DVT prophylaxis: Lovenox Consultants: Psych Code Status: Full code presumed Family Communication: None  Disposition Plan: Admission to stepdown, long-term disposition pending psych evaluation.   Truett Mainland, DO [facility] Pager [phone]  If 7PM-7AM, please contact night-coverage [email or web address] Password [email or web address]

## 2017-02-01 DIAGNOSIS — K3184 Gastroparesis: Secondary | ICD-10-CM

## 2017-02-01 DIAGNOSIS — F29 Unspecified psychosis not due to a substance or known physiological condition: Secondary | ICD-10-CM

## 2017-02-01 DIAGNOSIS — E119 Type 2 diabetes mellitus without complications: Secondary | ICD-10-CM

## 2017-02-01 DIAGNOSIS — I10 Essential (primary) hypertension: Secondary | ICD-10-CM

## 2017-02-01 DIAGNOSIS — N179 Acute kidney failure, unspecified: Secondary | ICD-10-CM

## 2017-02-01 DIAGNOSIS — Z794 Long term (current) use of insulin: Secondary | ICD-10-CM

## 2017-02-01 DIAGNOSIS — E876 Hypokalemia: Secondary | ICD-10-CM

## 2017-02-01 DIAGNOSIS — E1143 Type 2 diabetes mellitus with diabetic autonomic (poly)neuropathy: Principal | ICD-10-CM

## 2017-02-01 DIAGNOSIS — L8990 Pressure ulcer of unspecified site, unspecified stage: Secondary | ICD-10-CM | POA: Insufficient documentation

## 2017-02-01 DIAGNOSIS — R112 Nausea with vomiting, unspecified: Secondary | ICD-10-CM

## 2017-02-01 LAB — MRSA PCR SCREENING: MRSA by PCR: NEGATIVE

## 2017-02-01 LAB — BASIC METABOLIC PANEL
Anion gap: 5 (ref 5–15)
BUN: 13 mg/dL (ref 6–20)
CO2: 26 mmol/L (ref 22–32)
Calcium: 8 mg/dL — ABNORMAL LOW (ref 8.9–10.3)
Chloride: 107 mmol/L (ref 101–111)
Creatinine, Ser: 1.17 mg/dL (ref 0.61–1.24)
GFR calc Af Amer: >60 mL/min (ref >60)
GFR calc non Af Amer: >60 mL/min (ref >60)
Glucose, Bld: 77 mg/dL (ref 65–99)
Potassium: 3.1 mmol/L — ABNORMAL LOW (ref 3.5–5.1)
Sodium: 138 mmol/L (ref 135–145)

## 2017-02-01 LAB — CBC
HCT: 30.4 % — ABNORMAL LOW (ref 39.0–52.0)
Hemoglobin: 10.5 g/dL — ABNORMAL LOW (ref 13.0–17.0)
MCH: 30.4 pg (ref 26.0–34.0)
MCHC: 34.5 g/dL (ref 30.0–36.0)
MCV: 88.1 fL (ref 78.0–100.0)
Platelets: 153 10*3/uL (ref 150–400)
RBC: 3.45 MIL/uL — ABNORMAL LOW (ref 4.22–5.81)
RDW: 12.8 % (ref 11.5–15.5)
WBC: 7.6 10*3/uL (ref 4.0–10.5)

## 2017-02-01 LAB — GLUCOSE, CAPILLARY
Glucose-Capillary: 125 mg/dL — ABNORMAL HIGH (ref 65–99)
Glucose-Capillary: 74 mg/dL (ref 65–99)
Glucose-Capillary: 109 mg/dL — ABNORMAL HIGH (ref 65–99)
Glucose-Capillary: 197 mg/dL — ABNORMAL HIGH (ref 65–99)
Glucose-Capillary: 188 mg/dL — ABNORMAL HIGH (ref 65–99)
Glucose-Capillary: 117 mg/dL — ABNORMAL HIGH (ref 65–99)

## 2017-02-01 MED ORDER — INSULIN DETEMIR 100 UNIT/ML ~~LOC~~ SOLN
10.0000 [IU] | Freq: Every day | SUBCUTANEOUS | Status: DC
  Administered 2017-02-01: 10 [IU] via SUBCUTANEOUS
  Filled 2017-02-01 (×2): qty 0.1

## 2017-02-01 MED ORDER — LISINOPRIL 10 MG PO TABS
40.0000 mg | ORAL_TABLET | Freq: Every day | ORAL | Status: DC
  Administered 2017-02-01 – 2017-02-02 (×2): 40 mg via ORAL
  Filled 2017-02-01 (×2): qty 4

## 2017-02-01 MED ORDER — SODIUM CHLORIDE 0.9 % IV SOLN
INTRAVENOUS | Status: DC
  Administered 2017-02-01 (×2): via INTRAVENOUS

## 2017-02-01 MED ORDER — AMLODIPINE BESYLATE 5 MG PO TABS
5.0000 mg | ORAL_TABLET | Freq: Every day | ORAL | Status: DC
  Administered 2017-02-01 – 2017-02-02 (×2): 5 mg via ORAL
  Filled 2017-02-01 (×2): qty 1

## 2017-02-01 MED ORDER — SUCRALFATE 1 GM/10ML PO SUSP
1.0000 g | Freq: Three times a day (TID) | ORAL | Status: DC
  Administered 2017-02-01 – 2017-02-02 (×4): 1 g via ORAL
  Filled 2017-02-01 (×4): qty 10

## 2017-02-01 MED ORDER — HYDROCHLOROTHIAZIDE 25 MG PO TABS
25.0000 mg | ORAL_TABLET | Freq: Every day | ORAL | Status: DC
  Administered 2017-02-01 – 2017-02-02 (×2): 25 mg via ORAL
  Filled 2017-02-01 (×2): qty 1

## 2017-02-01 MED ORDER — POTASSIUM CHLORIDE 10 MEQ/100ML IV SOLN
10.0000 meq | INTRAVENOUS | Status: AC
  Administered 2017-02-01 (×4): 10 meq via INTRAVENOUS
  Filled 2017-02-01 (×4): qty 100

## 2017-02-01 NOTE — Progress Notes (Addendum)
PROGRESS NOTE    Timothy Clark  MEQ:[id number] DOB: [birth year] DOA: 01/31/2017 PCP: Tawni Carnes, PA-C    Brief Narrative:  46 year old male who presents with vomiting. He is known to have diabetes mellitus type 2, GERD, gastroparesis, hypertension. Progressing and worsening vomiting, worse with by mouth intake, improved while holding meals, associated with abdominal pain in the epigastric and left upper quadrant area. Recent hospitalization for gastroparesis. In the emergency department patient became agitated, his sister reported that patient had suicidal ideation but a week ago, he was involuntarily committed to the hospital. On his initial physical examination, blood pressure 174/99, heart rate 98, temperature 99.1, respiratory rate 20. Heart S1-S2 present and rhythmic, lungs were clear to auscultation bilaterally, abdomen was soft and protuberant, positive tenderness at the epigastric and left upper quadrant region. No guarding or peritoneal signs. No masses. No lower extremity edema. Sodium 136, potassium 3.4, chloride 103, bicarbonate 24, glucose 221, BUN 14, creatinine 1.27, white count 8.8, hemoglobin 12.0, hematocrit 33.2, platelets 161, urinalysis with greater than 300 protein, white cells 0-5, negative urine drug screen, chest x-ray negative for infiltrates, abdominal films with no air-fluid levels. EKG with normal sinus rhythm, normal intervals, normal axis.   Patient was admitted to the stepdown unit with working diagnosis of intractable nausea and vomiting, due to gastroparesis, complicated by acute kidney injury and psychosis.  Assessment & Plan:   Principal Problem:   Intractable vomiting Active Problems:   Essential hypertension   Gastroparesis due to DM (HCC)   AKI (acute kidney injury) ([place])   Insulin dependent diabetes mellitus ([place])   Psychosis   Hypokalemia   Pressure injury of skin  Correction: skin injury resolved.   1. Intractable nausea and vomiting. Will  advance diet to clears, will continue IV antiemetics, IV antiacids and IV fluids. No abdominal pain, no signs of ileus or bowel obstruction. Old records personally reviewed, GI office visit from 12/2016, with evidence of dumping syndrome, with delayed gastric emptying, exacerbated by his dietary/ behavior. Continue bentyl qid and sucralfate.   2. AKI with hypokalemia due to dehydration. Serum cr down to 1,17 from 1,27, will continue IV fluids with NS with decrease rate to 75 ml/ per hour, will replete K IV with kcl 40 meq and will follow on renal panel in am. Advance diet as tolerated. Avoid hypotension or nephrotoxic medications.   3. Psychosis. Patient more calm, denies being suicidal, will continue suicidal precautions, one to one observation and follow on psych recommendations.  4. T2DM. Serum glucose 77 this am, with capillary measurements 176, 199, 125, 74, 109. Patient with poor oral intake, likely non compliant at home, will decrease dose of basal insulin to 10 units to avoid hypoglycemia.   5. HTN. Uncontrolled blood pressure, will resume antihypertensive agents with amlodipine, lisinopril and HCTZ, for now will continue clonidine patch. Once tolerating po well, will change to po clonidine, due to risk of rebound hypertension.     DVT prophylaxis: enoxaparin  Code Status: Full  Family Communication:  Disposition Plan:    Consultants:     Procedures:     Antimicrobials:      Subjective: Patient feeling better, nausea and vomiting, improved, no chest pain or dyspnea. Increased appetite. No suicidal ideation.  Objective: Vitals:   02/01/17 0500 02/01/17 0600 02/01/17 0700 02/01/17 0800  BP: (!) 146/82 135/84 (!) 157/86 (!) 168/101  Pulse: 74 70 77 68  Resp: 12 14 15 12   Temp: 98.4 F (36.9 C)  TempSrc: Oral     SpO2: 99% 97% 99% 98%  Weight: (!) 137.9 kg (304 lb 0.2 oz)     Height:        Intake/Output Summary (Last 24 hours) at 02/01/17 0811 Last data filed at  02/01/17 0742  Gross per 24 hour  Intake          4046.25 ml  Output             2000 ml  Net          2046.25 ml   Filed Weights   01/31/17 1319 01/31/17 1745 02/01/17 0500  Weight: 136.1 kg (300 lb) (!) 139.1 kg (306 lb 10.6 oz) (!) 137.9 kg (304 lb 0.2 oz)    Examination:  General exam: deconditioned E ENT: mild pallor, no icterus, oral mucosa moist.  Respiratory system: Clear to auscultation. Respiratory effort normal. No rales, rhonchi or wheezing.  Cardiovascular system: S1 & S2 heard, RRR. No JVD, murmurs, rubs, gallops or clicks. No pedal edema. Gastrointestinal system: Abdomen is protuberant and mild distended, soft and nontender. No organomegaly or masses felt. Normal bowel sounds heard. Central nervous system: Alert and oriented. No focal neurological deficits. Extremities: Symmetric 5 x 5 power. Skin: No rashes, lesions or ulcers    Data Reviewed: I have personally reviewed following labs and imaging studies  CBC:  Recent Labs Lab 01/31/17 1322 02/01/17 0413  WBC 8.8 7.6  HGB 12.0* 10.5*  HCT 33.2* 30.4*  MCV 85.3 88.1  PLT 161 174   Basic Metabolic Panel:  Recent Labs Lab 01/31/17 1322 02/01/17 0413  NA 136 138  K 3.4* 3.1*  CL 103 107  CO2 24 26  GLUCOSE 221* 77  BUN 14 13  CREATININE 1.27* 1.17  CALCIUM 9.0 8.0*   GFR: Estimated Creatinine Clearance: 113.5 mL/min (by C-G formula based on SCr of 1.17 mg/dL). Liver Function Tests:  Recent Labs Lab 01/31/17 1322  AST 20  ALT 14*  ALKPHOS 58  BILITOT 0.7  PROT 6.9  ALBUMIN 3.4*    Recent Labs Lab 01/31/17 1322  LIPASE 31   No results for input(s): AMMONIA in the last 168 hours. Coagulation Profile: No results for input(s): INR, PROTIME in the last 168 hours. Cardiac Enzymes:  Recent Labs Lab 01/31/17 1322  TROPONINI <0.03   BNP (last 3 results) No results for input(s): PROBNP in the last 8760 hours. HbA1C: No results for input(s): HGBA1C in the last 72  hours. CBG:  Recent Labs Lab 01/31/17 1804 01/31/17 2030 02/01/17 0101 02/01/17 0507  GLUCAP 176* 199* 125* 74   Lipid Profile: No results for input(s): CHOL, HDL, LDLCALC, TRIG, CHOLHDL, LDLDIRECT in the last 72 hours. Thyroid Function Tests: No results for input(s): TSH, T4TOTAL, FREET4, T3FREE, THYROIDAB in the last 72 hours. Anemia Panel: No results for input(s): VITAMINB12, FOLATE, FERRITIN, TIBC, IRON, RETICCTPCT in the last 72 hours. Sepsis Labs: No results for input(s): PROCALCITON, LATICACIDVEN in the last 168 hours.  Recent Results (from the past 240 hour(s))  MRSA PCR Screening     Status: None   Collection Time: 01/31/17  6:29 PM  Result Value Ref Range Status   MRSA by PCR NEGATIVE NEGATIVE Final    Comment:        The GeneXpert MRSA Assay (FDA approved for NASAL specimens only), is one component of a comprehensive MRSA colonization surveillance program. It is not intended to diagnose MRSA infection nor to guide or monitor treatment for MRSA infections.  Radiology Studies: Dg Abd Acute W/chest  Result Date: 01/31/2017 CLINICAL DATA:  Pain and vomiting. EXAM: DG ABDOMEN ACUTE W/ 1V CHEST COMPARISON:  None. FINDINGS: There is no evidence of dilated bowel loops or free intraperitoneal air. No radiopaque calculi or other significant radiographic abnormality is seen. Heart size and mediastinal contours are within normal limits. Both lungs are clear. IMPRESSION: Negative abdominal radiographs.  No acute cardiopulmonary disease. Electronically Signed   By: Dorise Bullion III M.D   On: 01/31/2017 15:34        Scheduled Meds: . brimonidine  1 drop Both Eyes TID  . cloNIDine  0.2 mg Transdermal Weekly  . dicyclomine  10 mg Intramuscular QID  . enoxaparin (LOVENOX) injection  70 mg Subcutaneous Q24H  . insulin aspart  0-20 Units Subcutaneous Q4H  . insulin detemir  50 Units Subcutaneous QHS  . metoCLOPramide (REGLAN) injection  10 mg Intravenous  Q6H  . metoprolol tartrate  5 mg Intravenous Q6H  . pantoprazole (PROTONIX) IV  40 mg Intravenous Q12H  . timolol  1 drop Both Eyes BID   Continuous Infusions: . 0.9 % NaCl with KCl 40 mEq / L 125 mL/hr at 02/01/17 0500     LOS: 1 day       Jacobus Colvin Gerome Apley, MD [facility] Pager [phone]  If 7PM-7AM, please contact night-coverage [email or web address] Password [email or web address] 02/01/2017, 8:11 AM

## 2017-02-01 NOTE — Progress Notes (Signed)
Patient remained alert, oriented and calm during night shift. No nausea or vomiting during shift

## 2017-02-01 NOTE — Progress Notes (Signed)
Stated that cramps of muscles much less than they were yesterday.

## 2017-02-02 ENCOUNTER — Other Ambulatory Visit: Payer: Self-pay | Admitting: Internal Medicine

## 2017-02-02 LAB — BASIC METABOLIC PANEL
Anion gap: 7 (ref 5–15)
BUN: 9 mg/dL (ref 6–20)
CO2: 26 mmol/L (ref 22–32)
Calcium: 8.2 mg/dL — ABNORMAL LOW (ref 8.9–10.3)
Chloride: 104 mmol/L (ref 101–111)
Creatinine, Ser: 1.03 mg/dL (ref 0.61–1.24)
GFR calc Af Amer: >60 mL/min (ref >60)
GFR calc non Af Amer: >60 mL/min (ref >60)
Glucose, Bld: 126 mg/dL — ABNORMAL HIGH (ref 65–99)
Potassium: 3.3 mmol/L — ABNORMAL LOW (ref 3.5–5.1)
Sodium: 137 mmol/L (ref 135–145)

## 2017-02-02 LAB — GLUCOSE, CAPILLARY
Glucose-Capillary: 135 mg/dL — ABNORMAL HIGH (ref 65–99)
Glucose-Capillary: 139 mg/dL — ABNORMAL HIGH (ref 65–99)
Glucose-Capillary: 138 mg/dL — ABNORMAL HIGH (ref 65–99)

## 2017-02-02 LAB — HIV ANTIBODY (ROUTINE TESTING W REFLEX): HIV Screen 4th Generation wRfx: NONREACTIVE

## 2017-02-02 MED ORDER — POTASSIUM CHLORIDE 20 MEQ PO PACK
40.0000 meq | PACK | Freq: Once | ORAL | Status: AC
Start: 2017-02-02 — End: 2017-02-02
  Administered 2017-02-02: 40 meq via ORAL
  Filled 2017-02-02: qty 2

## 2017-02-02 MED ORDER — CLONIDINE HCL 0.1 MG PO TABS
0.1000 mg | ORAL_TABLET | Freq: Every day | ORAL | Status: DC
  Administered 2017-02-02: 0.1 mg via ORAL
  Filled 2017-02-02: qty 1

## 2017-02-02 NOTE — Progress Notes (Unsigned)
I called patient's pharmacy and patient is on clonidine 0.1 mg qhs. Last dose on 6/16. New refill called for another 30 days. Called patient at [phone] and informed about the refill.

## 2017-02-02 NOTE — Discharge Summary (Addendum)
Physician Discharge Summary  Timothy MOSSA ACZ:[id number] DOB: 1971-08-05 DOA: 01/31/2017  PCP: Tawni Carnes, PA-C  Admit date: 01/31/2017 Discharge date: 02/02/2017  Admitted From:  Home  Disposition:  Home    Recommendations for Outpatient Follow-up:  1. Follow up with PCP in 1- weeks 2. Patient should follow up with outpatient psychiatry, does not meet criteria for inpatient.    Home Health: No  Equipment/Devices: No    Discharge Condition: Stable  CODE STATUS: Full  Diet recommendation: Heart Healthy / Carb Modified   Brief/Interim Summary: 46 year old male who presents with vomiting. He is known to have diabetes mellitus type 2, GERD, gastroparesis, hypertension. Progressing and worsening vomiting, worse with by mouth intake, improved while holding meals, associated with abdominal pain in the epigastric and left upper quadrant area. Recent hospitalization for gastroparesis. In the emergency department patient became agitated, his sister reported that patient had suicidal ideation about a week ago, he was involuntarily committed to the hospital. On his initial physical examination, blood pressure 174/99, heart rate 98, temperature 99.1, respiratory rate 20. Heart S1-S2 present and rhythmic, lungs were clear to auscultation bilaterally, abdomen was soft and protuberant, positive tenderness at the epigastric and left upper quadrant region. No guarding or peritoneal signs. No masses. No lower extremity edema. Sodium 136, potassium 3.4, chloride 103, bicarbonate 24, glucose 221, BUN 14, creatinine 1.27, white count 8.8, hemoglobin 12.0, hematocrit 33.2, platelets 161, urinalysis with greater than 300 protein, white cells 0-5, negative urine drug screen, chest x-ray negative for infiltrates, abdominal films with no air-fluid levels. EKG with normal sinus rhythm, normal intervals, normal axis.   Patient was admitted to the stepdown unit with working diagnosis of intractable nausea and  vomiting, due to gastroparesis, complicated by acute kidney injury and psychosis.  1. Intractable nausea and vomiting. Patient was admitted to the stepdown unit, he was placed on intravenous fluids, IV antiemetics and IV antiacids. His diet was advanced progressively with good toleration. By the time of discharge he is tolerating regular diet. He will continue with Bentyl, metoclopramide with meals, and sucralfate. He was advised to eat small  portions and continue to follow-up as an outpatient with the Gastroenterology clinic.   2. Acute kidney injury with hypokalemia due to dehydration. Patient received isotonic IV fluids with improvement of the kidney function, discharge creatinine 1.0. He received potassium chloride for potassium repletion, discharge potassium 3.3.   3. Psychosis. Patient was placed on suicidal precautions, 1-1 sitter. No further suicidal thoughts, patient was seen by psychiatry with recommendations to follow-up as an outpatient, he did not meet criteria for inpatient treatment.   4. Hypertension. Blood pressure medications were continued including amlodipine, clonidine and lisinopril. Discharge blood pressure 148/92.   5. Type 2 diabetes mellitus. Patient was placed on insulin sliding scale for glucose coverage and monitoring, his  basal insulin dose was reduced to avoid hypoglycemia during his hospitalization. By the time of discharge he will resume his usual doses. Capillary glucose 135, 139, 138.    Discharge Diagnoses:  Principal Problem:   Intractable vomiting Active Problems:   Essential hypertension   Gastroparesis due to DM (HCC)   AKI (acute kidney injury) ([place])   Insulin dependent diabetes mellitus ([place])   Psychosis   Hypokalemia   Pressure injury of skin  Correction: skin injury resolved  Discharge Instructions   Allergies as of 02/02/2017      Reactions   Donnatal [pb-hyoscy-atropine-scopol Er] Anaphylaxis   Reaction to GI cocktail   Fish Allergy  Anaphylaxis, Hives, Rash   Haldol [haloperidol Lactate] Other (See Comments)   Chest Pain   Lidocaine Anaphylaxis   Reaction to GI cocktail   Maalox [calcium Carbonate Antacid] Anaphylaxis   Reaction to GI cocktail   Aspirin Hives, Itching   Bactrim [sulfamethoxazole-trimethoprim] Itching   Doxycycline Hives, Itching   Penicillins Hives, Itching   Has patient had a PCN reaction causing immediate rash, facial/tongue/throat swelling, SOB or lightheadedness with hypotension: yes Has patient had a PCN reaction causing severe rash involving mucus membranes or skin necrosis: yes Has patient had a PCN reaction that required hospitalization no Has patient had a PCN reaction occurring within the last 10 years: yes If all of the above answers are "NO", then may proceed with Cephalospor   Phenergan [promethazine Hcl] Nausea And Vomiting      Medication List    TAKE these medications   amLODipine 5 MG tablet Commonly known as:  NORVASC Take 5 mg by mouth daily.   atorvastatin 10 MG tablet Commonly known as:  LIPITOR Take 10 mg by mouth daily.   brimonidine 0.15 % ophthalmic solution Commonly known as:  ALPHAGAN Place 1 drop into both eyes 3 (three) times daily.   cyclobenzaprine 10 MG tablet Commonly known as:  FLEXERIL Take 10 mg by mouth at bedtime.   dicyclomine 10 MG capsule Commonly known as:  BENTYL Take 1 capsule (10 mg total) by mouth 4 (four) times daily -  before meals and at bedtime.   gabapentin 600 MG tablet Commonly known as:  NEURONTIN Take 600 mg by mouth 4 (four) times daily as needed (for neuropathy).   hydrochlorothiazide 25 MG tablet Commonly known as:  HYDRODIURIL Take 25 mg by mouth daily.   insulin detemir 100 UNIT/ML injection Commonly known as:  LEVEMIR Inject 30 Units into the skin at bedtime.   insulin NPH-regular Human (70-30) 100 UNIT/ML injection Commonly known as:  NOVOLIN 70/30 Inject 35 Units into the skin 2 (two) times daily.   JANUVIA  100 MG tablet Generic drug:  sitaGLIPtin Take 100 mg by mouth daily.   lisinopril 40 MG tablet Commonly known as:  PRINIVIL,ZESTRIL Take 40 mg by mouth daily.   metoCLOPramide 10 MG tablet Commonly known as:  REGLAN Take 10 mg by mouth 4 (four) times daily -  before meals and at bedtime.   pantoprazole 40 MG tablet Commonly known as:  PROTONIX TAKE 1 TABLET BY MOUTH TWICE DAILY BEFORE  A  MEAL   sucralfate 1 g tablet Commonly known as:  CARAFATE TAKE ONE TABLET BY MOUTH 4 TIMES DAILY   timolol 0.5 % ophthalmic solution Commonly known as:  BETIMOL Place 1 drop into both eyes 2 (two) times daily.     Clonidine 0.1 mg qhs.   Allergies  Allergen Reactions  . Donnatal [Pb-Hyoscy-Atropine-Scopol Er] Anaphylaxis    Reaction to GI cocktail  . Fish Allergy Anaphylaxis, Hives and Rash  . Haldol [Haloperidol Lactate] Other (See Comments)    Chest Pain  . Lidocaine Anaphylaxis    Reaction to GI cocktail  . Maalox [Calcium Carbonate Antacid] Anaphylaxis    Reaction to GI cocktail  . Aspirin Hives and Itching  . Bactrim [Sulfamethoxazole-Trimethoprim] Itching  . Doxycycline Hives and Itching  . Penicillins Hives and Itching    Has patient had a PCN reaction causing immediate rash, facial/tongue/throat swelling, SOB or lightheadedness with hypotension: yes Has patient had a PCN reaction causing severe rash involving mucus membranes or skin necrosis: yes Has patient had  a PCN reaction that required hospitalization no Has patient had a PCN reaction occurring within the last 10 years: yes If all of the above answers are "NO", then may proceed with Cephalospor  . Phenergan [Promethazine Hcl] Nausea And Vomiting    Consultations:  Psychiarty   Procedures/Studies: Dg Abd Acute W/chest  Result Date: 01/31/2017 CLINICAL DATA:  Pain and vomiting. EXAM: DG ABDOMEN ACUTE W/ 1V CHEST COMPARISON:  None. FINDINGS: There is no evidence of dilated bowel loops or free intraperitoneal air.  No radiopaque calculi or other significant radiographic abnormality is seen. Heart size and mediastinal contours are within normal limits. Both lungs are clear. IMPRESSION: Negative abdominal radiographs.  No acute cardiopulmonary disease. Electronically Signed   By: Dorise Bullion III M.D   On: 01/31/2017 15:34      Subjective: Patient feeling well, no nausea or vomiting, tolerating po well, no chest pain or dyspnea. No suicidal thoughts.   Discharge Exam: Vitals:   02/02/17 0952 02/02/17 0954  BP: (!) 148/92 (!) 148/92  Pulse:    Resp:    Temp:     Vitals:   02/02/17 0500 02/02/17 0732 02/02/17 0952 02/02/17 0954  BP:  (!) 167/92 (!) 148/92 (!) 148/92  Pulse:      Resp:  16    Temp: 98.5 F (36.9 C) 98.4 F (36.9 C)    TempSrc: Oral Oral    SpO2:  100%    Weight: (!) 138 kg (304 lb 3.8 oz)     Height:        General: Pt is alert, awake, not in acute distress E ENT: no pallor, oral mucosa moist.  Cardiovascular: RRR, S1/S2 +, no rubs, no gallops Respiratory: CTA bilaterally, no wheezing, no rhonchi Abdominal: Soft, NT, ND, bowel sounds + Extremities: no edema, no cyanosis    The results of significant diagnostics from this hospitalization (including imaging, microbiology, ancillary and laboratory) are listed below for reference.     Microbiology: Recent Results (from the past 240 hour(s))  MRSA PCR Screening     Status: None   Collection Time: 01/31/17  6:29 PM  Result Value Ref Range Status   MRSA by PCR NEGATIVE NEGATIVE Final    Comment:        The GeneXpert MRSA Assay (FDA approved for NASAL specimens only), is one component of a comprehensive MRSA colonization surveillance program. It is not intended to diagnose MRSA infection nor to guide or monitor treatment for MRSA infections.      Labs: BNP (last 3 results) No results for input(s): BNP in the last 8760 hours. Basic Metabolic Panel:  Recent Labs Lab 01/31/17 1322 02/01/17 0413  02/02/17 0459  NA 136 138 137  K 3.4* 3.1* 3.3*  CL 103 107 104  CO2 24 26 26   GLUCOSE 221* 77 126*  BUN 14 13 9   CREATININE 1.27* 1.17 1.03  CALCIUM 9.0 8.0* 8.2*   Liver Function Tests:  Recent Labs Lab 01/31/17 1322  AST 20  ALT 14*  ALKPHOS 58  BILITOT 0.7  PROT 6.9  ALBUMIN 3.4*    Recent Labs Lab 01/31/17 1322  LIPASE 31   No results for input(s): AMMONIA in the last 168 hours. CBC:  Recent Labs Lab 01/31/17 1322 02/01/17 0413  WBC 8.8 7.6  HGB 12.0* 10.5*  HCT 33.2* 30.4*  MCV 85.3 88.1  PLT 161 153   Cardiac Enzymes:  Recent Labs Lab 01/31/17 1322  TROPONINI <0.03   BNP: Invalid input(s): POCBNP CBG:  Recent Labs Lab 02/01/17 1731 02/01/17 2147 02/02/17 0118 02/02/17 0501 02/02/17 0729  GLUCAP 188* 117* 135* 139* 138*   D-Dimer No results for input(s): DDIMER in the last 72 hours. Hgb A1c No results for input(s): HGBA1C in the last 72 hours. Lipid Profile No results for input(s): CHOL, HDL, LDLCALC, TRIG, CHOLHDL, LDLDIRECT in the last 72 hours. Thyroid function studies No results for input(s): TSH, T4TOTAL, T3FREE, THYROIDAB in the last 72 hours.  Invalid input(s): FREET3 Anemia work up No results for input(s): VITAMINB12, FOLATE, FERRITIN, TIBC, IRON, RETICCTPCT in the last 72 hours. Urinalysis    Component Value Date/Time   COLORURINE YELLOW 01/31/2017 1410   APPEARANCEUR CLEAR 01/31/2017 1410   LABSPEC 1.010 01/31/2017 1410   PHURINE 8.0 01/31/2017 1410   GLUCOSEU 150 (A) 01/31/2017 1410   HGBUR SMALL (A) 01/31/2017 1410   BILIRUBINUR NEGATIVE 01/31/2017 1410   KETONESUR NEGATIVE 01/31/2017 1410   PROTEINUR >=300 (A) 01/31/2017 1410   UROBILINOGEN 0.2 06/07/2015 1353   NITRITE NEGATIVE 01/31/2017 1410   LEUKOCYTESUR NEGATIVE 01/31/2017 1410   Sepsis Labs Invalid input(s): PROCALCITONIN,  WBC,  LACTICIDVEN Microbiology Recent Results (from the past 240 hour(s))  MRSA PCR Screening     Status: None   Collection  Time: 01/31/17  6:29 PM  Result Value Ref Range Status   MRSA by PCR NEGATIVE NEGATIVE Final    Comment:        The GeneXpert MRSA Assay (FDA approved for NASAL specimens only), is one component of a comprehensive MRSA colonization surveillance program. It is not intended to diagnose MRSA infection nor to guide or monitor treatment for MRSA infections.      Time coordinating discharge: 45 minutes  SIGNED:   Tawni Millers, MD  [facility] 02/02/2017, 10:13 AM Pager   If 7PM-7AM, please contact night-coverage [email or web address] Password [email or web address]

## 2017-02-02 NOTE — Clinical Social Work Note (Signed)
CSW consult was made for patient by previous nurse (no comments were left as to why CSW consult was made). LCSW spoke with current nurse who was unaware of current CSW needs.  Please reconsult if patient has CSW needs.  LCSW signing off.       Aiman Sonn, Clydene Pugh, LCSW

## 2017-02-03 DIAGNOSIS — L97519 Non-pressure chronic ulcer of other part of right foot with unspecified severity: Secondary | ICD-10-CM | POA: Diagnosis not present

## 2017-02-03 DIAGNOSIS — E11621 Type 2 diabetes mellitus with foot ulcer: Secondary | ICD-10-CM | POA: Diagnosis not present

## 2017-02-04 DIAGNOSIS — E1165 Type 2 diabetes mellitus with hyperglycemia: Secondary | ICD-10-CM | POA: Diagnosis not present

## 2017-02-04 DIAGNOSIS — E1143 Type 2 diabetes mellitus with diabetic autonomic (poly)neuropathy: Secondary | ICD-10-CM | POA: Diagnosis not present

## 2017-02-04 DIAGNOSIS — I10 Essential (primary) hypertension: Secondary | ICD-10-CM | POA: Diagnosis not present

## 2017-02-04 DIAGNOSIS — E11621 Type 2 diabetes mellitus with foot ulcer: Secondary | ICD-10-CM | POA: Diagnosis not present

## 2017-02-04 DIAGNOSIS — K3184 Gastroparesis: Secondary | ICD-10-CM | POA: Diagnosis not present

## 2017-02-04 DIAGNOSIS — L97512 Non-pressure chronic ulcer of other part of right foot with fat layer exposed: Secondary | ICD-10-CM | POA: Diagnosis not present

## 2017-02-05 DIAGNOSIS — L97513 Non-pressure chronic ulcer of other part of right foot with necrosis of muscle: Secondary | ICD-10-CM | POA: Diagnosis not present

## 2017-02-05 DIAGNOSIS — E11621 Type 2 diabetes mellitus with foot ulcer: Secondary | ICD-10-CM | POA: Diagnosis not present

## 2017-02-06 DIAGNOSIS — E11621 Type 2 diabetes mellitus with foot ulcer: Secondary | ICD-10-CM | POA: Diagnosis not present

## 2017-02-06 DIAGNOSIS — K3184 Gastroparesis: Secondary | ICD-10-CM | POA: Diagnosis not present

## 2017-02-06 DIAGNOSIS — E1143 Type 2 diabetes mellitus with diabetic autonomic (poly)neuropathy: Secondary | ICD-10-CM | POA: Diagnosis not present

## 2017-02-06 DIAGNOSIS — I10 Essential (primary) hypertension: Secondary | ICD-10-CM | POA: Diagnosis not present

## 2017-02-06 DIAGNOSIS — L97512 Non-pressure chronic ulcer of other part of right foot with fat layer exposed: Secondary | ICD-10-CM | POA: Diagnosis not present

## 2017-02-06 DIAGNOSIS — E1165 Type 2 diabetes mellitus with hyperglycemia: Secondary | ICD-10-CM | POA: Diagnosis not present

## 2017-02-07 ENCOUNTER — Emergency Department (HOSPITAL_COMMUNITY)
Admission: EM | Admit: 2017-02-07 | Discharge: 2017-02-08 | Disposition: A | Discharge status: Home / Self Care | Payer: Medicare Other | Attending: Emergency Medicine | Admitting: Emergency Medicine

## 2017-02-07 ENCOUNTER — Encounter (HOSPITAL_COMMUNITY): Payer: Self-pay | Admitting: *Deleted

## 2017-02-07 DIAGNOSIS — K3184 Gastroparesis: Secondary | ICD-10-CM | POA: Diagnosis not present

## 2017-02-07 DIAGNOSIS — I10 Essential (primary) hypertension: Secondary | ICD-10-CM | POA: Diagnosis not present

## 2017-02-07 DIAGNOSIS — R1013 Epigastric pain: Secondary | ICD-10-CM | POA: Diagnosis not present

## 2017-02-07 DIAGNOSIS — E1143 Type 2 diabetes mellitus with diabetic autonomic (poly)neuropathy: Secondary | ICD-10-CM | POA: Insufficient documentation

## 2017-02-07 DIAGNOSIS — Z7984 Long term (current) use of oral hypoglycemic drugs: Secondary | ICD-10-CM | POA: Insufficient documentation

## 2017-02-07 DIAGNOSIS — Z79899 Other long term (current) drug therapy: Secondary | ICD-10-CM | POA: Insufficient documentation

## 2017-02-07 DIAGNOSIS — R109 Unspecified abdominal pain: Secondary | ICD-10-CM | POA: Diagnosis present

## 2017-02-07 DIAGNOSIS — Z794 Long term (current) use of insulin: Secondary | ICD-10-CM | POA: Diagnosis not present

## 2017-02-07 LAB — COMPREHENSIVE METABOLIC PANEL
ALT: 14 U/L — ABNORMAL LOW (ref 17–63)
AST: 17 U/L (ref 15–41)
Albumin: 3.5 g/dL (ref 3.5–5.0)
Alkaline Phosphatase: 56 U/L (ref 38–126)
Anion gap: 10 (ref 5–15)
BUN: 20 mg/dL (ref 6–20)
CO2: 26 mmol/L (ref 22–32)
Calcium: 9.1 mg/dL (ref 8.9–10.3)
Chloride: 103 mmol/L (ref 101–111)
Creatinine, Ser: 2.16 mg/dL — ABNORMAL HIGH (ref 0.61–1.24)
GFR calc Af Amer: 40 mL/min — ABNORMAL LOW (ref >60)
GFR calc non Af Amer: 35 mL/min — ABNORMAL LOW (ref >60)
Glucose, Bld: 204 mg/dL — ABNORMAL HIGH (ref 65–99)
Potassium: 4 mmol/L (ref 3.5–5.1)
Sodium: 139 mmol/L (ref 135–145)
Total Bilirubin: 1.1 mg/dL (ref 0.3–1.2)
Total Protein: 7.2 g/dL (ref 6.5–8.1)

## 2017-02-07 LAB — CBC WITH DIFFERENTIAL/PLATELET
Basophils Absolute: 0 10*3/uL (ref 0.0–0.1)
Basophils Relative: 0 %
Eosinophils Absolute: 0 10*3/uL (ref 0.0–0.7)
Eosinophils Relative: 1 %
HCT: 33.1 % — ABNORMAL LOW (ref 39.0–52.0)
Hemoglobin: 11.5 g/dL — ABNORMAL LOW (ref 13.0–17.0)
Lymphocytes Relative: 24 %
Lymphs Abs: 2 10*3/uL (ref 0.7–4.0)
MCH: 30.5 pg (ref 26.0–34.0)
MCHC: 34.7 g/dL (ref 30.0–36.0)
MCV: 87.8 fL (ref 78.0–100.0)
Monocytes Absolute: 0.5 10*3/uL (ref 0.1–1.0)
Monocytes Relative: 7 %
Neutro Abs: 5.7 10*3/uL (ref 1.7–7.7)
Neutrophils Relative %: 68 %
Platelets: 186 10*3/uL (ref 150–400)
RBC: 3.77 MIL/uL — ABNORMAL LOW (ref 4.22–5.81)
RDW: 13.1 % (ref 11.5–15.5)
WBC: 8.3 10*3/uL (ref 4.0–10.5)

## 2017-02-07 LAB — LIPASE, BLOOD: Lipase: 27 U/L (ref 11–51)

## 2017-02-07 MED ORDER — DIPHENHYDRAMINE HCL 50 MG/ML IJ SOLN
25.0000 mg | Freq: Once | INTRAMUSCULAR | Status: DC
Start: 2017-02-07 — End: 2017-02-08

## 2017-02-07 MED ORDER — METOCLOPRAMIDE HCL 5 MG/ML IJ SOLN: 10.0000 mg | Freq: Once | INTRAMUSCULAR | Status: DC

## 2017-02-07 MED ORDER — SODIUM CHLORIDE 0.9 % IV BOLUS (SEPSIS): 1000.0000 mL | Freq: Once | INTRAVENOUS | Status: DC

## 2017-02-07 MED ORDER — DICYCLOMINE HCL 10 MG/ML IM SOLN: 20.0000 mg | Freq: Once | INTRAMUSCULAR | Status: DC

## 2017-02-07 MED ORDER — SODIUM CHLORIDE 0.9 % IV BOLUS (SEPSIS): 500.0000 mL | Freq: Once | INTRAVENOUS | Status: DC

## 2017-02-07 NOTE — ED Triage Notes (Signed)
abd pain since this morning "Gastroperisis" per pt  Followed by Dr Saunders Glance

## 2017-02-07 NOTE — ED Triage Notes (Signed)
Pt reports upper abdominal pain since this morning with n/v. Pt reports just being discharged from the hospital on Monday after being admitted for "gastroparesis"

## 2017-02-08 NOTE — ED Notes (Signed)
Per charge [employer], pt had been stuck 6 times without success. Pt told EDP Tomi Bamberger that he was feeling better and wanted to be discharged and would come back if needed but has a follow up appointment on Monday with his PCP.

## 2017-02-08 NOTE — Discharge Instructions (Signed)
Continue your medications. Recheck if you get worse again.

## 2017-02-08 NOTE — ED Provider Notes (Signed)
[facility] DEPT Provider Note   CSN: [id number] Arrival date & time: 02/07/17  2008  Time seen 23:35 PM  History   Chief Complaint Chief Complaint  Patient presents with  . Abdominal Pain    HPI Timothy Clark is a 46 y.o. male.  HPI   patient states this morning that his gastroparesis started flaring up. He states he's had nausea with vomiting about 3-4 times today. He complains of sharp epigastric pain that sometimes throbs. He states it will come and go and lasts about 4-5 hours. He denies diarrhea or fever, feeling dizzy or lightheaded. He states he only ate some scrambled eggs this morning which is not atypical. He states he took an Ativan about 7 PM hoping that would help but it didn't. Patient was admitted June 23 to the 24th for similar problems.  PCP Tawni Carnes, PA-C   Past Medical History:  Diagnosis Date  . Chronic abdominal pain   . Esophagitis   . Eye hemorrhage   . Gastritis   . Gastroparesis   . GERD (gastroesophageal reflux disease)   . Glaucoma   . Helicobacter pylori (H. pylori) infection   . Hiccups   . Hypertension   . Nausea and vomiting    chronic, recurrent  . Type 2 diabetes mellitus with complications (HCC)    diagnosed around age 30    Patient Active Problem List   Diagnosis Date Noted  . Pressure injury of skin 02/01/2017  . Psychosis 01/31/2017  . Hypokalemia 01/31/2017  . AKI (acute kidney injury) ([place]) 09/07/2016  . Insulin dependent diabetes mellitus ([place]) 09/07/2016  . Orthostatic syncope 09/05/2016  . Syncope and collapse   . 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 Diarrhea 09/27/2015  . Dehydration 02/17/2015  . Gastroparesis due to DM ([place]) 02/06/2015  . Intractable vomiting 01/15/2015  . Gastritis   . Essential hypertension 12/06/2014  . H. pylori infection 11/27/2014  . Acute esophagitis 10/29/2014  . Abdominal pain, epigastric   . Mucosal abnormality of stomach   . Mucosal abnormality of esophagus   . Nausea vomiting  and diarrhea 10/25/2014  . DIABETIC FOOT ULCER 02/14/2010  . LIMB PAIN 12/11/2009  . UNSPECIFIED PERIPHERAL VASCULAR DISEASE 10/19/2009  . ERECTILE DYSFUNCTION, ORGANIC 10/19/2009  . NUMBNESS 07/20/2009  . Diabetes mellitus type 2 with complications, uncontrolled ([place]) 03/22/2009  . HYPERLIPIDEMIA 03/22/2009  . DEPRESSIVE DISORDER 03/22/2009    Past Surgical History:  Procedure Laterality Date  . CATARACT EXTRACTION W/PHACO Left 05/19/2016   Procedure: CATARACT EXTRACTION PHACO AND INTRAOCULAR LENS PLACEMENT LEFT EYE;  Surgeon: Tonny Branch, MD;  Location: [facility];  Service: Ophthalmology;  Laterality: Left;  CDE: 7.30  . CATARACT EXTRACTION W/PHACO Right 06/23/2016   Procedure: CATARACT EXTRACTION PHACO AND INTRAOCULAR LENS PLACEMENT RIGHT EYE CDE=9.87;  Surgeon: Tonny Branch, MD;  Location: [facility];  Service: Ophthalmology;  Laterality: Right;  right  . ESOPHAGOGASTRODUODENOSCOPY  2015   Dr. Britta Mccreedy  . ESOPHAGOGASTRODUODENOSCOPY N/A 10/26/2014   RMR: Distal esophagititis-likely reflux related although an element of pill induced injuruy no exclueded  status post biopsy. Diffusely abnormal gastric mucosa of uncertain signigicane -status post gastric biopsy. Focal area of excoriation in the cardia most consistant with a trauma of heaving.   [name] EYE SURGERY  2015   stent placed to left eye  . EYE SURGERY         Home Medications    Prior to Admission medications   Medication Sig Start Date End Date Taking? Authorizing Provider  amLODipine (  NORVASC) 5 MG tablet Take 5 mg by mouth daily.   Yes [provider]  atorvastatin (LIPITOR) 10 MG tablet Take 10 mg by mouth daily.   Yes [provider]  brimonidine (ALPHAGAN) 0.15 % ophthalmic solution Place 1 drop into both eyes 2 (two) times daily.    Yes [provider]  cetirizine (ZYRTEC) 10 MG tablet Take 10 mg by mouth daily.   Yes [provider]  cyclobenzaprine (FLEXERIL) 10 MG tablet Take 10 mg by mouth at  bedtime.    Yes [provider]  dicyclomine (BENTYL) 10 MG capsule Take 1 capsule (10 mg total) by mouth 4 (four) times daily -  before meals and at bedtime. 12/26/16  Yes Annitta Needs, NP  gabapentin (NEURONTIN) 600 MG tablet Take 600 mg by mouth 4 (four) times daily as needed (for neuropathy).    Yes [provider]  hydrochlorothiazide (HYDRODIURIL) 25 MG tablet Take 25 mg by mouth daily.   Yes [provider]  insulin detemir (LEVEMIR) 100 UNIT/ML injection Inject 30 Units into the skin at bedtime.   Yes [provider]  insulin NPH-regular Human (NOVOLIN 70/30) (70-30) 100 UNIT/ML injection Inject 35 Units into the skin 2 (two) times daily.   Yes [provider]  lisinopril (PRINIVIL,ZESTRIL) 40 MG tablet Take 40 mg by mouth daily.   Yes [provider]  metoCLOPramide (REGLAN) 10 MG tablet Take 10 mg by mouth 4 (four) times daily -  before meals and at bedtime.   Yes [provider]  pantoprazole (PROTONIX) 40 MG tablet TAKE 1 TABLET BY MOUTH TWICE DAILY BEFORE  A  MEAL 12/16/16  Yes Walden Field A, NP  sitaGLIPtin (JANUVIA) 100 MG tablet Take 100 mg by mouth daily.   Yes [provider]  sucralfate (CARAFATE) 1 g tablet TAKE ONE TABLET BY MOUTH 4 TIMES DAILY 03/12/16  Yes Annitta Needs, NP  timolol (BETIMOL) 0.5 % ophthalmic solution Place 1 drop into both eyes 2 (two) times daily.   Yes [provider]    Family History Family History  Problem Relation Age of Onset  . Ovarian cancer Mother   . Cervical cancer Sister   . Diabetes Sister   . Colon cancer Neg Hx     Social History Social History  Substance Use Topics  . Smoking status: Never Smoker  . Smokeless tobacco: Never Used  . Alcohol use No  on disability for being blind in right eye and decreased vision in left from gastroparesis   Allergies   Donnatal [pb-hyoscy-atropine-scopol er]; Fish allergy; Haldol [haloperidol lactate]; Lidocaine; Maalox  [calcium carbonate antacid]; Aspirin; Bactrim [sulfamethoxazole-trimethoprim]; Doxycycline; Penicillins; and Phenergan [promethazine hcl]   Review of Systems Review of Systems  All other systems reviewed and are negative.    Physical Exam Updated Vital Signs BP 134/75 (BP Location: Right Arm)   Pulse 88   Temp 98.2 F (36.8 C) (Oral)   Resp 17   Ht 6' (1.829 m)   Wt (!) 137.9 kg (304 lb)   SpO2 100%   BMI 41.23 kg/m   Vital signs normal    Physical Exam  Constitutional: He is oriented to person, place, and time. He appears well-developed and well-nourished.  Non-toxic appearance. He does not appear ill. No distress.  obese  HENT:  Head: Normocephalic and atraumatic.  Right Ear: External ear normal.  Left Ear: External ear normal.  Nose: Nose normal. No mucosal edema or rhinorrhea.  Mouth/Throat: Oropharynx is  clear and moist and mucous membranes are normal. No dental abscesses or uvula swelling.  Eyes: Conjunctivae and EOM are normal. Pupils are equal, round, and reactive to light.  Neck: Normal range of motion and full passive range of motion without pain. Neck supple.  Cardiovascular: Normal rate, regular rhythm and normal heart sounds.  Exam reveals no gallop and no friction rub.   No murmur heard. Pulmonary/Chest: Effort normal and breath sounds normal. No respiratory distress. He has no wheezes. He has no rhonchi. He has no rales. He exhibits no tenderness and no crepitus.  Abdominal: Soft. Normal appearance and bowel sounds are normal. He exhibits no distension. There is tenderness in the epigastric area. There is no rebound and no guarding.    Musculoskeletal: Normal range of motion. He exhibits no edema or tenderness.  Moves all extremities well.   Neurological: He is alert and oriented to person, place, and time. He has normal strength. No cranial nerve deficit.  Skin: Skin is warm, dry and intact. No rash noted. No erythema. No pallor.  Psychiatric: He has a  normal mood and affect. His speech is normal and behavior is normal. His mood appears not anxious.  Nursing note and vitals reviewed.    ED Treatments / Results  Labs (all labs ordered are listed, but only abnormal results are displayed) Results for orders placed or performed during the hospital encounter of 02/07/17  CBC with Differential  Result Value Ref Range   WBC 8.3 4.0 - 10.5 K/uL   RBC 3.77 (L) 4.22 - 5.81 MIL/uL   Hemoglobin 11.5 (L) 13.0 - 17.0 g/dL   HCT 33.1 (L) 39.0 - 52.0 %   MCV 87.8 78.0 - 100.0 fL   MCH 30.5 26.0 - 34.0 pg   MCHC 34.7 30.0 - 36.0 g/dL   RDW 13.1 11.5 - 15.5 %   Platelets 186 150 - 400 K/uL   Neutrophils Relative % 68 %   Neutro Abs 5.7 1.7 - 7.7 K/uL   Lymphocytes Relative 24 %   Lymphs Abs 2.0 0.7 - 4.0 K/uL   Monocytes Relative 7 %   Monocytes Absolute 0.5 0.1 - 1.0 K/uL   Eosinophils Relative 1 %   Eosinophils Absolute 0.0 0.0 - 0.7 K/uL   Basophils Relative 0 %   Basophils Absolute 0.0 0.0 - 0.1 K/uL  Comprehensive metabolic panel  Result Value Ref Range   Sodium 139 135 - 145 mmol/L   Potassium 4.0 3.5 - 5.1 mmol/L   Chloride 103 101 - 111 mmol/L   CO2 26 22 - 32 mmol/L   Glucose, Bld 204 (H) 65 - 99 mg/dL   BUN 20 6 - 20 mg/dL   Creatinine, Ser 2.16 (H) 0.61 - 1.24 mg/dL   Calcium 9.1 8.9 - 10.3 mg/dL   Total Protein 7.2 6.5 - 8.1 g/dL   Albumin 3.5 3.5 - 5.0 g/dL   AST 17 15 - 41 U/L   ALT 14 (L) 17 - 63 U/L   Alkaline Phosphatase 56 38 - 126 U/L   Total Bilirubin 1.1 0.3 - 1.2 mg/dL   GFR calc non Af Amer 35 (L) >60 mL/min   GFR calc Af Amer 40 (L) >60 mL/min   Anion gap 10 5 - 15  Lipase, blood  Result Value Ref Range   Lipase 27 11 - 51 U/L   Laboratory interpretation all normal except mild stable anemia, hyperglycemia without metabolic acidosis, new renal insufficiency c/w 5 days ago c/w dehydration  EKG  EKG Interpretation None       Radiology No results found.  Procedures Procedures (including  critical care time)  Medications Ordered in ED Medications  sodium chloride 0.9 % bolus 1,000 mL (not administered)  metoCLOPramide (REGLAN) injection 10 mg (not administered)  diphenhydrAMINE (BENADRYL) injection 25 mg (not administered)  dicyclomine (BENTYL) injection 20 mg (not administered)  sodium chloride 0.9 % bolus 1,000 mL (not administered)     Initial Impression / Assessment and Plan / ED Course  I have reviewed the triage vital signs and the nursing notes.  Pertinent labs & imaging results that were available during my care of the patient were reviewed by me and considered in my medical decision making (see chart for details).  Patient was given IV fluids for his dehydration and given Reglan, Benadryl, and Bentyl for his complaints of gastroparesis.  Recheck at 01:40 AM Patient states nursing staff was unable to these IV and he did not get any medications. However he states he feels better and feels ready to go home.  Final Clinical Impressions(s) / ED Diagnoses   Final diagnoses:  Epigastric pain  Gastroparesis diabeticorum [facility])    Plan discharge  Rolland Porter, MD, Barbette Or, MD 02/08/17 [phone]

## 2017-02-09 DIAGNOSIS — Z6839 Body mass index (BMI) 39.0-39.9, adult: Secondary | ICD-10-CM | POA: Diagnosis not present

## 2017-02-09 DIAGNOSIS — E1143 Type 2 diabetes mellitus with diabetic autonomic (poly)neuropathy: Secondary | ICD-10-CM | POA: Diagnosis not present

## 2017-02-09 DIAGNOSIS — I10 Essential (primary) hypertension: Secondary | ICD-10-CM | POA: Diagnosis not present

## 2017-02-09 DIAGNOSIS — R002 Palpitations: Secondary | ICD-10-CM | POA: Diagnosis not present

## 2017-02-09 DIAGNOSIS — L97512 Non-pressure chronic ulcer of other part of right foot with fat layer exposed: Secondary | ICD-10-CM | POA: Diagnosis not present

## 2017-02-09 DIAGNOSIS — F419 Anxiety disorder, unspecified: Secondary | ICD-10-CM | POA: Diagnosis not present

## 2017-02-09 DIAGNOSIS — K3184 Gastroparesis: Secondary | ICD-10-CM | POA: Diagnosis not present

## 2017-02-09 DIAGNOSIS — E11621 Type 2 diabetes mellitus with foot ulcer: Secondary | ICD-10-CM | POA: Diagnosis not present

## 2017-02-09 DIAGNOSIS — E876 Hypokalemia: Secondary | ICD-10-CM | POA: Diagnosis not present

## 2017-02-09 DIAGNOSIS — R7309 Other abnormal glucose: Secondary | ICD-10-CM | POA: Diagnosis not present

## 2017-02-09 DIAGNOSIS — E1165 Type 2 diabetes mellitus with hyperglycemia: Secondary | ICD-10-CM | POA: Diagnosis not present

## 2017-02-10 ENCOUNTER — Emergency Department (HOSPITAL_COMMUNITY): Payer: Medicare Other

## 2017-02-10 ENCOUNTER — Emergency Department (HOSPITAL_COMMUNITY)
Admission: EM | Admit: 2017-02-10 | Discharge: 2017-02-10 | Disposition: A | Discharge status: Home / Self Care | Payer: Medicare Other | Attending: Emergency Medicine | Admitting: Emergency Medicine

## 2017-02-10 ENCOUNTER — Encounter (HOSPITAL_COMMUNITY): Payer: Self-pay | Admitting: *Deleted

## 2017-02-10 DIAGNOSIS — Z7984 Long term (current) use of oral hypoglycemic drugs: Secondary | ICD-10-CM | POA: Diagnosis not present

## 2017-02-10 DIAGNOSIS — E119 Type 2 diabetes mellitus without complications: Secondary | ICD-10-CM | POA: Insufficient documentation

## 2017-02-10 DIAGNOSIS — K5900 Constipation, unspecified: Secondary | ICD-10-CM | POA: Diagnosis not present

## 2017-02-10 DIAGNOSIS — R109 Unspecified abdominal pain: Secondary | ICD-10-CM | POA: Diagnosis present

## 2017-02-10 DIAGNOSIS — Z79899 Other long term (current) drug therapy: Secondary | ICD-10-CM | POA: Insufficient documentation

## 2017-02-10 DIAGNOSIS — R252 Cramp and spasm: Secondary | ICD-10-CM | POA: Diagnosis not present

## 2017-02-10 DIAGNOSIS — I10 Essential (primary) hypertension: Secondary | ICD-10-CM | POA: Insufficient documentation

## 2017-02-10 LAB — BASIC METABOLIC PANEL
Anion gap: 11 (ref 5–15)
BUN: 22 mg/dL — ABNORMAL HIGH (ref 6–20)
CO2: 25 mmol/L (ref 22–32)
Calcium: 9.1 mg/dL (ref 8.9–10.3)
Chloride: 96 mmol/L — ABNORMAL LOW (ref 101–111)
Creatinine, Ser: 2.11 mg/dL — ABNORMAL HIGH (ref 0.61–1.24)
GFR calc Af Amer: 42 mL/min — ABNORMAL LOW (ref >60)
GFR calc non Af Amer: 36 mL/min — ABNORMAL LOW (ref >60)
Glucose, Bld: 281 mg/dL — ABNORMAL HIGH (ref 65–99)
Potassium: 4 mmol/L (ref 3.5–5.1)
Sodium: 132 mmol/L — ABNORMAL LOW (ref 135–145)

## 2017-02-10 LAB — HEPATIC FUNCTION PANEL
ALT: 12 U/L — ABNORMAL LOW (ref 17–63)
AST: 17 U/L (ref 15–41)
Albumin: 3.4 g/dL — ABNORMAL LOW (ref 3.5–5.0)
Alkaline Phosphatase: 59 U/L (ref 38–126)
Bilirubin, Direct: 0.1 mg/dL (ref 0.1–0.5)
Indirect Bilirubin: 1 mg/dL — ABNORMAL HIGH (ref 0.3–0.9)
Total Bilirubin: 1.1 mg/dL (ref 0.3–1.2)
Total Protein: 6.7 g/dL (ref 6.5–8.1)

## 2017-02-10 LAB — CBC WITH DIFFERENTIAL/PLATELET
Basophils Absolute: 0 10*3/uL (ref 0.0–0.1)
Basophils Relative: 0 %
Eosinophils Absolute: 0.1 10*3/uL (ref 0.0–0.7)
Eosinophils Relative: 1 %
HCT: 33.8 % — ABNORMAL LOW (ref 39.0–52.0)
Hemoglobin: 11.9 g/dL — ABNORMAL LOW (ref 13.0–17.0)
Lymphocytes Relative: 15 %
Lymphs Abs: 1.5 10*3/uL (ref 0.7–4.0)
MCH: 30.4 pg (ref 26.0–34.0)
MCHC: 35.2 g/dL (ref 30.0–36.0)
MCV: 86.4 fL (ref 78.0–100.0)
Monocytes Absolute: 0.8 10*3/uL (ref 0.1–1.0)
Monocytes Relative: 8 %
Neutro Abs: 7.5 10*3/uL (ref 1.7–7.7)
Neutrophils Relative %: 76 %
Platelets: 187 10*3/uL (ref 150–400)
RBC: 3.91 MIL/uL — ABNORMAL LOW (ref 4.22–5.81)
RDW: 12.9 % (ref 11.5–15.5)
WBC: 9.8 10*3/uL (ref 4.0–10.5)

## 2017-02-10 MED ORDER — LORAZEPAM 2 MG/ML IJ SOLN
0.5000 mg | Freq: Once | INTRAMUSCULAR | Status: AC
  Administered 2017-02-10: 0.5 mg via INTRAVENOUS
  Filled 2017-02-10: qty 1

## 2017-02-10 MED ORDER — SODIUM CHLORIDE 0.9 % IV BOLUS (SEPSIS)
1000.0000 mL | Freq: Once | INTRAVENOUS | Status: AC
  Administered 2017-02-10: 1000 mL via INTRAVENOUS

## 2017-02-10 MED ORDER — ONDANSETRON HCL 4 MG/2ML IJ SOLN
4.0000 mg | Freq: Once | INTRAMUSCULAR | Status: AC
  Administered 2017-02-10: 4 mg via INTRAVENOUS
  Filled 2017-02-10: qty 2

## 2017-02-10 MED ORDER — HYDROMORPHONE HCL 1 MG/ML IJ SOLN
1.0000 mg | Freq: Once | INTRAMUSCULAR | Status: AC
  Administered 2017-02-10: 1 mg via INTRAVENOUS
  Filled 2017-02-10: qty 1

## 2017-02-10 MED ORDER — PANTOPRAZOLE SODIUM 40 MG IV SOLR
40.0000 mg | Freq: Once | INTRAVENOUS | Status: AC
  Administered 2017-02-10: 40 mg via INTRAVENOUS
  Filled 2017-02-10: qty 40

## 2017-02-10 MED ORDER — PANTOPRAZOLE SODIUM 40 MG IV SOLR
INTRAVENOUS | Status: AC
  Filled 2017-02-10: qty 40

## 2017-02-10 NOTE — Discharge Instructions (Signed)
Increase your Flexeril to take 1 pill every 8-12 hours as needed for muscle spasm. You can continue to use it for sleep at night but make sure you do not take any more than 3 pills a day.  Follow up with your family doctor in a week for checkup

## 2017-02-10 NOTE — ED Triage Notes (Addendum)
Pt c/o muscle cramps all over his body that started yesterday. Pt reports he was told last week that his potassium was low and was given IV Potassium. Pt had labwork repeated yesterday by PCP but hasn't had return call yet. Pt also c/o nausea with no vomiting, hx of gastroparesis. Pt also reports he is very anxious.

## 2017-02-10 NOTE — ED Provider Notes (Signed)
[facility] DEPT Provider Note   CSN: [id number] Arrival date & time: 02/10/17  1156     History   Chief Complaint Chief Complaint  Patient presents with  . Abdominal Cramping    HPI Timothy Clark is a 46 y.o. male.  Patient complains of muscle spasm to his face his arms chest and abdomen. He states this happened before and his potassium was low   The history is provided by the patient. No language interpreter was used.  Muscle Pain  This is a new problem. The current episode started 12 to 24 hours ago. The problem occurs constantly. The problem has not changed since onset.Pertinent negatives include no chest pain, no abdominal pain and no headaches. Nothing aggravates the symptoms. Nothing relieves the symptoms. He has tried nothing for the symptoms.    Past Medical History:  Diagnosis Date  . Chronic abdominal pain   . Esophagitis   . Eye hemorrhage   . Gastritis   . Gastroparesis   . GERD (gastroesophageal reflux disease)   . Glaucoma   . Helicobacter pylori (H. pylori) infection   . Hiccups   . Hypertension   . Nausea and vomiting    chronic, recurrent  . Type 2 diabetes mellitus with complications (HCC)    diagnosed around age 60    Patient Active Problem List   Diagnosis Date Noted  . Pressure injury of skin 02/01/2017  . Psychosis 01/31/2017  . Hypokalemia 01/31/2017  . AKI (acute kidney injury) ([place]) 09/07/2016  . Insulin dependent diabetes mellitus ([place]) 09/07/2016  . Orthostatic syncope 09/05/2016  . Syncope and collapse   . 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 Diarrhea 09/27/2015  . Dehydration 02/17/2015  . Gastroparesis due to DM ([place]) 02/06/2015  . Intractable vomiting 01/15/2015  . Gastritis   . Essential hypertension 12/06/2014  . H. pylori infection 11/27/2014  . Acute esophagitis 10/29/2014  . Abdominal pain, epigastric   . Mucosal abnormality of stomach   . Mucosal abnormality of esophagus   . Nausea vomiting and diarrhea 10/25/2014    . DIABETIC FOOT ULCER 02/14/2010  . LIMB PAIN 12/11/2009  . UNSPECIFIED PERIPHERAL VASCULAR DISEASE 10/19/2009  . ERECTILE DYSFUNCTION, ORGANIC 10/19/2009  . NUMBNESS 07/20/2009  . Diabetes mellitus type 2 with complications, uncontrolled ([place]) 03/22/2009  . HYPERLIPIDEMIA 03/22/2009  . DEPRESSIVE DISORDER 03/22/2009    Past Surgical History:  Procedure Laterality Date  . CATARACT EXTRACTION W/PHACO Left 05/19/2016   Procedure: CATARACT EXTRACTION PHACO AND INTRAOCULAR LENS PLACEMENT LEFT EYE;  Surgeon: Tonny Branch, MD;  Location: [facility];  Service: Ophthalmology;  Laterality: Left;  CDE: 7.30  . CATARACT EXTRACTION W/PHACO Right 06/23/2016   Procedure: CATARACT EXTRACTION PHACO AND INTRAOCULAR LENS PLACEMENT RIGHT EYE CDE=9.87;  Surgeon: Tonny Branch, MD;  Location: [facility];  Service: Ophthalmology;  Laterality: Right;  right  . ESOPHAGOGASTRODUODENOSCOPY  2015   Dr. Britta Mccreedy  . ESOPHAGOGASTRODUODENOSCOPY N/A 10/26/2014   RMR: Distal esophagititis-likely reflux related although an element of pill induced injuruy no exclueded  status post biopsy. Diffusely abnormal gastric mucosa of uncertain signigicane -status post gastric biopsy. Focal area of excoriation in the cardia most consistant with a trauma of heaving.   [name] EYE SURGERY  2015   stent placed to left eye  . EYE SURGERY         Home Medications    Prior to Admission medications   Medication Sig Start Date End Date Taking? Authorizing Provider  amLODipine (NORVASC) 5 MG tablet Take 5 mg by  mouth daily.   Yes [provider]  atorvastatin (LIPITOR) 10 MG tablet Take 10 mg by mouth daily.   Yes [provider]  brimonidine (ALPHAGAN) 0.15 % ophthalmic solution Place 1 drop into both eyes 2 (two) times daily.    Yes [provider]  cetirizine (ZYRTEC) 10 MG tablet Take 10 mg by mouth daily.   Yes [provider]  cyclobenzaprine (FLEXERIL) 10 MG tablet Take 10 mg by mouth at bedtime.    Yes  [provider]  dicyclomine (BENTYL) 10 MG capsule Take 1 capsule (10 mg total) by mouth 4 (four) times daily -  before meals and at bedtime. 12/26/16  Yes Annitta Needs, NP  gabapentin (NEURONTIN) 600 MG tablet Take 600 mg by mouth 4 (four) times daily as needed (for neuropathy).    Yes [provider]  hydrochlorothiazide (HYDRODIURIL) 25 MG tablet Take 25 mg by mouth daily.   Yes [provider]  insulin detemir (LEVEMIR) 100 UNIT/ML injection Inject 30 Units into the skin at bedtime.   Yes [provider]  insulin NPH-regular Human (NOVOLIN 70/30) (70-30) 100 UNIT/ML injection Inject 35 Units into the skin 2 (two) times daily.   Yes [provider]  lisinopril (PRINIVIL,ZESTRIL) 40 MG tablet Take 40 mg by mouth daily.   Yes [provider]  metoCLOPramide (REGLAN) 10 MG tablet Take 10 mg by mouth 4 (four) times daily -  before meals and at bedtime.   Yes [provider]  pantoprazole (PROTONIX) 40 MG tablet TAKE 1 TABLET BY MOUTH TWICE DAILY BEFORE  A  MEAL 12/16/16  Yes Walden Field A, NP  sitaGLIPtin (JANUVIA) 100 MG tablet Take 100 mg by mouth daily.   Yes [provider]  sucralfate (CARAFATE) 1 g tablet TAKE ONE TABLET BY MOUTH 4 TIMES DAILY 03/12/16  Yes Annitta Needs, NP  timolol (BETIMOL) 0.5 % ophthalmic solution Place 1 drop into both eyes 2 (two) times daily.   Yes [provider]    Family History Family History  Problem Relation Age of Onset  . Ovarian cancer Mother   . Cervical cancer Sister   . Diabetes Sister   . Colon cancer Neg Hx     Social History Social History  Substance Use Topics  . Smoking status: Never Smoker  . Smokeless tobacco: Never Used  . Alcohol use No     Allergies   Donnatal [pb-hyoscy-atropine-scopol er]; Fish allergy; Haldol [haloperidol lactate]; Lidocaine; Maalox [calcium carbonate antacid]; Aspirin; Bactrim [sulfamethoxazole-trimethoprim]; Doxycycline; Penicillins;  and Phenergan [promethazine hcl]   Review of Systems Review of Systems  Constitutional: Negative for appetite change and fatigue.  HENT: Negative for congestion, ear discharge and sinus pressure.   Eyes: Negative for discharge.  Respiratory: Negative for cough.   Cardiovascular: Negative for chest pain.  Gastrointestinal: Negative for abdominal pain and diarrhea.  Genitourinary: Negative for frequency and hematuria.  Musculoskeletal: Negative for back pain.       Muscle spasm  Skin: Negative for rash.  Neurological: Negative for seizures and headaches.  Psychiatric/Behavioral: Negative for hallucinations.     Physical Exam Updated Vital Signs BP (!) 155/95   Pulse 79   Temp 98.2 F (36.8 C) (Oral)   Resp 13   Ht 6' (1.829 m)   Wt (!) 137.9 kg (304 lb)   SpO2 100%   BMI 41.23 kg/m   Physical Exam  Constitutional: He is oriented to person, place, and time. He appears well-developed.  HENT:  Head: Normocephalic.  Eyes: Conjunctivae and EOM are normal. No scleral icterus.  Neck: Neck supple. No thyromegaly present.  Cardiovascular: Normal rate and regular rhythm.  Exam reveals no gallop and no friction rub.   No murmur heard. Pulmonary/Chest: No stridor. He has no wheezes. He has no rales. He exhibits no tenderness.  Abdominal: He exhibits no distension. There is no tenderness. There is no rebound.  Musculoskeletal: Normal range of motion. He exhibits no edema.  Lymphadenopathy:    He has no cervical adenopathy.  Neurological: He is oriented to person, place, and time. He exhibits normal muscle tone. Coordination normal.  Skin: No rash noted. No erythema.  Psychiatric: He has a normal mood and affect. His behavior is normal.     ED Treatments / Results  Labs (all labs ordered are listed, but only abnormal results are displayed) Labs Reviewed  CBC WITH DIFFERENTIAL/PLATELET - Abnormal; Notable for the following:       Result Value   RBC 3.91 (*)    Hemoglobin  11.9 (*)    HCT 33.8 (*)    All other components within normal limits  BASIC METABOLIC PANEL - Abnormal; Notable for the following:    Sodium 132 (*)    Chloride 96 (*)    Glucose, Bld 281 (*)    BUN 22 (*)    Creatinine, Ser 2.11 (*)    GFR calc non Af Amer 36 (*)    GFR calc Af Amer 42 (*)    All other components within normal limits  HEPATIC FUNCTION PANEL - Abnormal; Notable for the following:    Albumin 3.4 (*)    ALT 12 (*)    Indirect Bilirubin 1.0 (*)    All other components within normal limits    EKG  EKG Interpretation None       Radiology Dg Abd Acute W/chest  Result Date: 02/10/2017 CLINICAL DATA:  Cramping and chest discomfort EXAM: DG ABDOMEN ACUTE W/ 1V CHEST COMPARISON:  01/31/2017 FINDINGS: Cardiac shadow is within normal limits. The lungs are hypoinflated but clear. No acute bony abnormality is noted. Scattered large and small bowel gas is noted. Fecal material is noted throughout the colon consistent with mild constipation. Degenerative changes of lumbar spine are seen. No abnormal mass or abnormal calcifications are noted. IMPRESSION: Mild constipation.  No other focal abnormality is noted. Electronically Signed   By: Inez Catalina M.D.   On: 02/10/2017 13:27    Procedures Procedures (including critical care time)  Medications Ordered in ED Medications  sodium chloride 0.9 % bolus 1,000 mL (not administered)  LORazepam (ATIVAN) injection 0.5 mg (0.5 mg Intravenous Given 02/10/17 1240)  HYDROmorphone (DILAUDID) injection 1 mg (1 mg Intravenous Given 02/10/17 1242)  ondansetron (ZOFRAN) injection 4 mg (4 mg Intravenous Given 02/10/17 1241)  pantoprazole (PROTONIX) injection 40 mg (40 mg Intravenous Given 02/10/17 1242)     Initial Impression / Assessment and Plan / ED Course  I have reviewed the triage vital signs and the nursing notes.  Pertinent labs & imaging results that were available during my care of the patient were reviewed by me and considered in my  medical decision making (see chart for details).     Labs unremarkable. Muscle spasms improved with treatment. Patient told to increase his Flexeril to take 1 pill every 8 hours as needed for muscle spasm and follow-up with his PCP  Final Clinical Impressions(s) / ED Diagnoses   Final diagnoses:  Muscle cramping    New  Prescriptions New Prescriptions   No medications on file     Milton Ferguson, MD 02/10/17 1427

## 2017-02-10 NOTE — ED Notes (Signed)
Pt taken to xray 

## 2017-02-12 DIAGNOSIS — I10 Essential (primary) hypertension: Secondary | ICD-10-CM | POA: Diagnosis not present

## 2017-02-12 DIAGNOSIS — L97512 Non-pressure chronic ulcer of other part of right foot with fat layer exposed: Secondary | ICD-10-CM | POA: Diagnosis not present

## 2017-02-12 DIAGNOSIS — E1165 Type 2 diabetes mellitus with hyperglycemia: Secondary | ICD-10-CM | POA: Diagnosis not present

## 2017-02-12 DIAGNOSIS — E1143 Type 2 diabetes mellitus with diabetic autonomic (poly)neuropathy: Secondary | ICD-10-CM | POA: Diagnosis not present

## 2017-02-12 DIAGNOSIS — E11621 Type 2 diabetes mellitus with foot ulcer: Secondary | ICD-10-CM | POA: Diagnosis not present

## 2017-02-12 DIAGNOSIS — K3184 Gastroparesis: Secondary | ICD-10-CM | POA: Diagnosis not present

## 2017-02-13 DIAGNOSIS — K3184 Gastroparesis: Secondary | ICD-10-CM | POA: Diagnosis not present

## 2017-02-13 DIAGNOSIS — E11621 Type 2 diabetes mellitus with foot ulcer: Secondary | ICD-10-CM | POA: Diagnosis not present

## 2017-02-13 DIAGNOSIS — E1165 Type 2 diabetes mellitus with hyperglycemia: Secondary | ICD-10-CM | POA: Diagnosis not present

## 2017-02-13 DIAGNOSIS — L97512 Non-pressure chronic ulcer of other part of right foot with fat layer exposed: Secondary | ICD-10-CM | POA: Diagnosis not present

## 2017-02-13 DIAGNOSIS — I10 Essential (primary) hypertension: Secondary | ICD-10-CM | POA: Diagnosis not present

## 2017-02-13 DIAGNOSIS — E1143 Type 2 diabetes mellitus with diabetic autonomic (poly)neuropathy: Secondary | ICD-10-CM | POA: Diagnosis not present

## 2017-02-16 DIAGNOSIS — L97512 Non-pressure chronic ulcer of other part of right foot with fat layer exposed: Secondary | ICD-10-CM | POA: Diagnosis not present

## 2017-02-16 DIAGNOSIS — I10 Essential (primary) hypertension: Secondary | ICD-10-CM | POA: Diagnosis not present

## 2017-02-16 DIAGNOSIS — E1165 Type 2 diabetes mellitus with hyperglycemia: Secondary | ICD-10-CM | POA: Diagnosis not present

## 2017-02-16 DIAGNOSIS — E1143 Type 2 diabetes mellitus with diabetic autonomic (poly)neuropathy: Secondary | ICD-10-CM | POA: Diagnosis not present

## 2017-02-16 DIAGNOSIS — E11621 Type 2 diabetes mellitus with foot ulcer: Secondary | ICD-10-CM | POA: Diagnosis not present

## 2017-02-16 DIAGNOSIS — K3184 Gastroparesis: Secondary | ICD-10-CM | POA: Diagnosis not present

## 2017-02-17 DIAGNOSIS — L97513 Non-pressure chronic ulcer of other part of right foot with necrosis of muscle: Secondary | ICD-10-CM | POA: Diagnosis not present

## 2017-02-17 DIAGNOSIS — L97519 Non-pressure chronic ulcer of other part of right foot with unspecified severity: Secondary | ICD-10-CM | POA: Diagnosis not present

## 2017-02-17 DIAGNOSIS — E11621 Type 2 diabetes mellitus with foot ulcer: Secondary | ICD-10-CM | POA: Diagnosis not present

## 2017-02-18 DIAGNOSIS — E113599 Type 2 diabetes mellitus with proliferative diabetic retinopathy without macular edema, unspecified eye: Secondary | ICD-10-CM | POA: Diagnosis not present

## 2017-02-18 DIAGNOSIS — E11621 Type 2 diabetes mellitus with foot ulcer: Secondary | ICD-10-CM | POA: Diagnosis not present

## 2017-02-18 DIAGNOSIS — K219 Gastro-esophageal reflux disease without esophagitis: Secondary | ICD-10-CM | POA: Diagnosis not present

## 2017-02-18 DIAGNOSIS — E1165 Type 2 diabetes mellitus with hyperglycemia: Secondary | ICD-10-CM | POA: Diagnosis not present

## 2017-02-18 DIAGNOSIS — L97512 Non-pressure chronic ulcer of other part of right foot with fat layer exposed: Secondary | ICD-10-CM | POA: Diagnosis not present

## 2017-02-18 DIAGNOSIS — E1143 Type 2 diabetes mellitus with diabetic autonomic (poly)neuropathy: Secondary | ICD-10-CM | POA: Diagnosis not present

## 2017-02-18 DIAGNOSIS — K3184 Gastroparesis: Secondary | ICD-10-CM | POA: Diagnosis not present

## 2017-02-18 DIAGNOSIS — I10 Essential (primary) hypertension: Secondary | ICD-10-CM | POA: Diagnosis not present

## 2017-02-20 DIAGNOSIS — E1143 Type 2 diabetes mellitus with diabetic autonomic (poly)neuropathy: Secondary | ICD-10-CM | POA: Diagnosis not present

## 2017-02-20 DIAGNOSIS — E11621 Type 2 diabetes mellitus with foot ulcer: Secondary | ICD-10-CM | POA: Diagnosis not present

## 2017-02-20 DIAGNOSIS — I10 Essential (primary) hypertension: Secondary | ICD-10-CM | POA: Diagnosis not present

## 2017-02-20 DIAGNOSIS — K3184 Gastroparesis: Secondary | ICD-10-CM | POA: Diagnosis not present

## 2017-02-20 DIAGNOSIS — L97512 Non-pressure chronic ulcer of other part of right foot with fat layer exposed: Secondary | ICD-10-CM | POA: Diagnosis not present

## 2017-02-20 DIAGNOSIS — E1165 Type 2 diabetes mellitus with hyperglycemia: Secondary | ICD-10-CM | POA: Diagnosis not present

## 2017-02-21 ENCOUNTER — Encounter (HOSPITAL_COMMUNITY): Payer: Self-pay | Admitting: Emergency Medicine

## 2017-02-21 ENCOUNTER — Emergency Department (HOSPITAL_COMMUNITY)
Admission: EM | Admit: 2017-02-21 | Discharge: 2017-02-21 | Disposition: A | Discharge status: Home / Self Care | Payer: Medicare Other | Attending: Emergency Medicine | Admitting: Emergency Medicine

## 2017-02-21 DIAGNOSIS — E1140 Type 2 diabetes mellitus with diabetic neuropathy, unspecified: Secondary | ICD-10-CM | POA: Insufficient documentation

## 2017-02-21 DIAGNOSIS — K219 Gastro-esophageal reflux disease without esophagitis: Secondary | ICD-10-CM | POA: Insufficient documentation

## 2017-02-21 DIAGNOSIS — E1143 Type 2 diabetes mellitus with diabetic autonomic (poly)neuropathy: Secondary | ICD-10-CM | POA: Diagnosis not present

## 2017-02-21 DIAGNOSIS — Z794 Long term (current) use of insulin: Secondary | ICD-10-CM | POA: Insufficient documentation

## 2017-02-21 DIAGNOSIS — K3184 Gastroparesis: Secondary | ICD-10-CM

## 2017-02-21 DIAGNOSIS — Z79899 Other long term (current) drug therapy: Secondary | ICD-10-CM | POA: Insufficient documentation

## 2017-02-21 DIAGNOSIS — E1151 Type 2 diabetes mellitus with diabetic peripheral angiopathy without gangrene: Secondary | ICD-10-CM | POA: Diagnosis not present

## 2017-02-21 DIAGNOSIS — Z8719 Personal history of other diseases of the digestive system: Secondary | ICD-10-CM | POA: Insufficient documentation

## 2017-02-21 DIAGNOSIS — Z7984 Long term (current) use of oral hypoglycemic drugs: Secondary | ICD-10-CM | POA: Insufficient documentation

## 2017-02-21 DIAGNOSIS — I10 Essential (primary) hypertension: Secondary | ICD-10-CM | POA: Diagnosis not present

## 2017-02-21 DIAGNOSIS — R1084 Generalized abdominal pain: Secondary | ICD-10-CM | POA: Diagnosis present

## 2017-02-21 LAB — COMPREHENSIVE METABOLIC PANEL
ALT: 26 U/L (ref 17–63)
AST: 27 U/L (ref 15–41)
Albumin: 3.5 g/dL (ref 3.5–5.0)
Alkaline Phosphatase: 70 U/L (ref 38–126)
Anion gap: 11 (ref 5–15)
BUN: 24 mg/dL — ABNORMAL HIGH (ref 6–20)
CO2: 27 mmol/L (ref 22–32)
Calcium: 9.1 mg/dL (ref 8.9–10.3)
Chloride: 98 mmol/L — ABNORMAL LOW (ref 101–111)
Creatinine, Ser: 1.65 mg/dL — ABNORMAL HIGH (ref 0.61–1.24)
GFR calc Af Amer: 56 mL/min — ABNORMAL LOW (ref >60)
GFR calc non Af Amer: 48 mL/min — ABNORMAL LOW (ref >60)
Glucose, Bld: 332 mg/dL — ABNORMAL HIGH (ref 65–99)
Potassium: 4.2 mmol/L (ref 3.5–5.1)
Sodium: 136 mmol/L (ref 135–145)
Total Bilirubin: 0.9 mg/dL (ref 0.3–1.2)
Total Protein: 7 g/dL (ref 6.5–8.1)

## 2017-02-21 LAB — URINALYSIS, ROUTINE W REFLEX MICROSCOPIC
Bacteria, UA: NONE SEEN
Bilirubin Urine: NEGATIVE
Glucose, UA: >=500 mg/dL — AB
Ketones, ur: NEGATIVE mg/dL
Leukocytes, UA: NEGATIVE
Nitrite: NEGATIVE
Protein, ur: 100 mg/dL — AB
Specific Gravity, Urine: 1.008 (ref 1.005–1.030)
Squamous Epithelial / LPF: NONE SEEN
pH: 5 (ref 5.0–8.0)

## 2017-02-21 LAB — CBC
HCT: 32.7 % — ABNORMAL LOW (ref 39.0–52.0)
Hemoglobin: 11.7 g/dL — ABNORMAL LOW (ref 13.0–17.0)
MCH: 31 pg (ref 26.0–34.0)
MCHC: 35.8 g/dL (ref 30.0–36.0)
MCV: 86.5 fL (ref 78.0–100.0)
Platelets: 144 10*3/uL — ABNORMAL LOW (ref 150–400)
RBC: 3.78 MIL/uL — ABNORMAL LOW (ref 4.22–5.81)
RDW: 12.7 % (ref 11.5–15.5)
WBC: 8.9 10*3/uL (ref 4.0–10.5)

## 2017-02-21 LAB — LIPASE, BLOOD: Lipase: 29 U/L (ref 11–51)

## 2017-02-21 MED ORDER — METOCLOPRAMIDE HCL 10 MG PO TABS: 10.0000 mg | ORAL_TABLET | Freq: Four times a day (QID) | ORAL | 1 refills | Status: DC | PRN

## 2017-02-21 MED ORDER — SODIUM CHLORIDE 0.9 % IV BOLUS (SEPSIS)
1000.0000 mL | Freq: Once | INTRAVENOUS | Status: AC
  Administered 2017-02-21: 1000 mL via INTRAVENOUS

## 2017-02-21 MED ORDER — PANTOPRAZOLE SODIUM 40 MG IV SOLR
40.0000 mg | Freq: Once | INTRAVENOUS | Status: AC
  Administered 2017-02-21: 40 mg via INTRAVENOUS
  Filled 2017-02-21: qty 40

## 2017-02-21 MED ORDER — HYDROMORPHONE HCL 1 MG/ML IJ SOLN
0.5000 mg | Freq: Once | INTRAMUSCULAR | Status: AC
  Administered 2017-02-21: 0.5 mg via INTRAVENOUS
  Filled 2017-02-21: qty 1

## 2017-02-21 NOTE — ED Provider Notes (Signed)
[facility] DEPT Provider Note   CSN: [id number] Arrival date & time: 02/21/17  1643     History   Chief Complaint Chief Complaint  Patient presents with  . Abdominal Pain    HPI Timothy Clark is a 46 y.o. male.  Pt complains of vomiting and weakness.  Pt states it is his gastroparesis   The history is provided by the patient. No language interpreter was used.  Abdominal Pain   This is a recurrent problem. The current episode started less than 1 hour ago. The problem occurs every several days. The problem has been gradually improving. The pain is associated with an unknown factor. The pain is located in the generalized abdominal region. The quality of the pain is aching. Associated symptoms include vomiting. Pertinent negatives include diarrhea, frequency, hematuria and headaches.    Past Medical History:  Diagnosis Date  . Chronic abdominal pain   . Esophagitis   . Eye hemorrhage   . Gastritis   . Gastroparesis   . GERD (gastroesophageal reflux disease)   . Glaucoma   . Helicobacter pylori (H. pylori) infection   . Hiccups   . Hypertension   . Nausea and vomiting    chronic, recurrent  . Type 2 diabetes mellitus with complications (HCC)    diagnosed around age 19    Patient Active Problem List   Diagnosis Date Noted  . Pressure injury of skin 02/01/2017  . Psychosis 01/31/2017  . Hypokalemia 01/31/2017  . AKI (acute kidney injury) ([place]) 09/07/2016  . Insulin dependent diabetes mellitus ([place]) 09/07/2016  . Orthostatic syncope 09/05/2016  . Syncope and collapse   . 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 Diarrhea 09/27/2015  . Dehydration 02/17/2015  . Gastroparesis due to DM ([place]) 02/06/2015  . Intractable vomiting 01/15/2015  . Gastritis   . Essential hypertension 12/06/2014  . H. pylori infection 11/27/2014  . Acute esophagitis 10/29/2014  . Abdominal pain, epigastric   . Mucosal abnormality of stomach   . Mucosal abnormality of esophagus   . Nausea  vomiting and diarrhea 10/25/2014  . DIABETIC FOOT ULCER 02/14/2010  . LIMB PAIN 12/11/2009  . UNSPECIFIED PERIPHERAL VASCULAR DISEASE 10/19/2009  . ERECTILE DYSFUNCTION, ORGANIC 10/19/2009  . NUMBNESS 07/20/2009  . Diabetes mellitus type 2 with complications, uncontrolled ([place]) 03/22/2009  . HYPERLIPIDEMIA 03/22/2009  . DEPRESSIVE DISORDER 03/22/2009    Past Surgical History:  Procedure Laterality Date  . CATARACT EXTRACTION W/PHACO Left 05/19/2016   Procedure: CATARACT EXTRACTION PHACO AND INTRAOCULAR LENS PLACEMENT LEFT EYE;  Surgeon: Tonny Branch, MD;  Location: [facility];  Service: Ophthalmology;  Laterality: Left;  CDE: 7.30  . CATARACT EXTRACTION W/PHACO Right 06/23/2016   Procedure: CATARACT EXTRACTION PHACO AND INTRAOCULAR LENS PLACEMENT RIGHT EYE CDE=9.87;  Surgeon: Tonny Branch, MD;  Location: [facility];  Service: Ophthalmology;  Laterality: Right;  right  . ESOPHAGOGASTRODUODENOSCOPY  2015   Dr. Britta Mccreedy  . ESOPHAGOGASTRODUODENOSCOPY N/A 10/26/2014   RMR: Distal esophagititis-likely reflux related although an element of pill induced injuruy no exclueded  status post biopsy. Diffusely abnormal gastric mucosa of uncertain signigicane -status post gastric biopsy. Focal area of excoriation in the cardia most consistant with a trauma of heaving.   [name] EYE SURGERY  2015   stent placed to left eye  . EYE SURGERY         Home Medications    Prior to Admission medications   Medication Sig Start Date End Date Taking? Authorizing Provider  amLODipine (NORVASC) 5 MG tablet Take 5 mg  by mouth daily.   Yes [provider]  atorvastatin (LIPITOR) 10 MG tablet Take 10 mg by mouth daily.   Yes [provider]  brimonidine (ALPHAGAN) 0.15 % ophthalmic solution Place 1 drop into both eyes 2 (two) times daily.    Yes [provider]  cetirizine (ZYRTEC) 10 MG tablet Take 10 mg by mouth daily.   Yes [provider]  cyclobenzaprine (FLEXERIL) 10 MG tablet Take 10 mg by  mouth at bedtime as needed for muscle spasms.    Yes [provider]  dicyclomine (BENTYL) 10 MG capsule Take 1 capsule (10 mg total) by mouth 4 (four) times daily -  before meals and at bedtime. Patient taking differently: Take 10 mg by mouth 2 (two) times daily.  12/26/16  Yes Annitta Needs, NP  gabapentin (NEURONTIN) 600 MG tablet Take 600 mg by mouth 4 (four) times daily as needed (for neuropathy).    Yes [provider]  hydrochlorothiazide (HYDRODIURIL) 25 MG tablet Take 25 mg by mouth daily.   Yes [provider]  insulin detemir (LEVEMIR) 100 UNIT/ML injection Inject 20 Units into the skin at bedtime as needed.    Yes [provider]  insulin NPH-regular Human (NOVOLIN 70/30) (70-30) 100 UNIT/ML injection Inject 35 Units into the skin 2 (two) times daily.   Yes [provider]  lisinopril (PRINIVIL,ZESTRIL) 40 MG tablet Take 40 mg by mouth daily.   Yes [provider]  LORazepam (ATIVAN) 0.5 MG tablet Take 0.5 mg by mouth at bedtime as needed for anxiety.   Yes [provider]  pantoprazole (PROTONIX) 40 MG tablet TAKE 1 TABLET BY MOUTH TWICE DAILY BEFORE  A  MEAL 12/16/16  Yes Walden Field A, NP  sitaGLIPtin (JANUVIA) 100 MG tablet Take 100 mg by mouth daily.   Yes [provider]  sucralfate (CARAFATE) 1 g tablet TAKE ONE TABLET BY MOUTH 4 TIMES DAILY 03/12/16  Yes Annitta Needs, NP  timolol (BETIMOL) 0.5 % ophthalmic solution Place 1 drop into both eyes 2 (two) times daily.   Yes [provider]  metoCLOPramide (REGLAN) 10 MG tablet Take 1 tablet (10 mg total) by mouth every 6 (six) hours as needed for nausea (nausea/headache). 02/21/17   Milton Ferguson, MD    Family History Family History  Problem Relation Age of Onset  . Ovarian cancer Mother   . Cervical cancer Sister   . Diabetes Sister   . Colon cancer Neg Hx     Social History Social History  Substance Use Topics  . Smoking status: Never Smoker  .  Smokeless tobacco: Never Used  . Alcohol use No     Allergies   Donnatal [pb-hyoscy-atropine-scopol er]; Fish allergy; Haldol [haloperidol lactate]; Lidocaine; Maalox [calcium carbonate antacid]; Aspirin; Bactrim [sulfamethoxazole-trimethoprim]; Doxycycline; Penicillins; and Phenergan [promethazine hcl]   Review of Systems Review of Systems  Constitutional: Negative for appetite change and fatigue.  HENT: Negative for congestion, ear discharge and sinus pressure.   Eyes: Negative for discharge.  Respiratory: Negative for cough.   Cardiovascular: Negative for chest pain.  Gastrointestinal: Positive for abdominal pain and vomiting. Negative for diarrhea.  Genitourinary: Negative for frequency and hematuria.  Musculoskeletal: Negative for back pain.  Skin: Negative for rash.  Neurological: Negative for seizures and headaches.  Psychiatric/Behavioral: Negative for hallucinations.     Physical Exam Updated Vital Signs BP (!) 154/89   Pulse 90   Temp 100.1 F (37.8 C)   Resp 18  Ht 6' (1.829 m)   Wt 136.1 kg (300 lb)   SpO2 97%   BMI 40.69 kg/m   Physical Exam  Constitutional: He is oriented to person, place, and time. He appears well-developed.  HENT:  Head: Normocephalic.  Eyes: Conjunctivae and EOM are normal. No scleral icterus.  Neck: Neck supple. No thyromegaly present.  Cardiovascular: Normal rate and regular rhythm.  Exam reveals no gallop and no friction rub.   No murmur heard. Pulmonary/Chest: No stridor. He has no wheezes. He has no rales. He exhibits no tenderness.  Abdominal: He exhibits no distension. There is tenderness. There is no rebound.  Mild tenderness  Musculoskeletal: Normal range of motion. He exhibits no edema.  Lymphadenopathy:    He has no cervical adenopathy.  Neurological: He is oriented to person, place, and time. He exhibits normal muscle tone. Coordination normal.  Skin: No rash noted. No erythema.  Psychiatric: He has a normal mood and  affect. His behavior is normal.     ED Treatments / Results  Labs (all labs ordered are listed, but only abnormal results are displayed) Labs Reviewed  COMPREHENSIVE METABOLIC PANEL - Abnormal; Notable for the following:       Result Value   Chloride 98 (*)    Glucose, Bld 332 (*)    BUN 24 (*)    Creatinine, Ser 1.65 (*)    GFR calc non Af Amer 48 (*)    GFR calc Af Amer 56 (*)    All other components within normal limits  CBC - Abnormal; Notable for the following:    RBC 3.78 (*)    Hemoglobin 11.7 (*)    HCT 32.7 (*)    Platelets 144 (*)    All other components within normal limits  URINALYSIS, ROUTINE W REFLEX MICROSCOPIC - Abnormal; Notable for the following:    Glucose, UA >=500 (*)    Hgb urine dipstick SMALL (*)    Protein, ur 100 (*)    All other components within normal limits  LIPASE, BLOOD    EKG  EKG Interpretation None       Radiology No results found.  Procedures Procedures (including critical care time)  Medications Ordered in ED Medications  sodium chloride 0.9 % bolus 1,000 mL (1,000 mLs Intravenous New Bag/Given 02/21/17 1755)  pantoprazole (PROTONIX) injection 40 mg (40 mg Intravenous Given 02/21/17 1755)  HYDROmorphone (DILAUDID) injection 0.5 mg (0.5 mg Intravenous Given 02/21/17 1755)     Initial Impression / Assessment and Plan / ED Course  I have reviewed the triage vital signs and the nursing notes.  Pertinent labs & imaging results that were available during my care of the patient were reviewed by me and considered in my medical decision making (see chart for details).     Patient improved with fluids and Toradol. Patient will be sent home with Reglan to help with gastroparesis  Final Clinical Impressions(s) / ED Diagnoses   Final diagnoses:  Gastroparesis    New Prescriptions New Prescriptions   METOCLOPRAMIDE (REGLAN) 10 MG TABLET    Take 1 tablet (10 mg total) by mouth every 6 (six) hours as needed for nausea  (nausea/headache).     Milton Ferguson, MD 02/21/17 1946

## 2017-02-21 NOTE — Discharge Instructions (Signed)
Follow up with your md this week. °

## 2017-02-21 NOTE — ED Triage Notes (Signed)
Pt c/o abd pain with n/v and generalized muscle cramping since 0800 this am.

## 2017-02-23 DIAGNOSIS — E11621 Type 2 diabetes mellitus with foot ulcer: Secondary | ICD-10-CM | POA: Diagnosis not present

## 2017-02-23 DIAGNOSIS — L97512 Non-pressure chronic ulcer of other part of right foot with fat layer exposed: Secondary | ICD-10-CM | POA: Diagnosis not present

## 2017-02-23 DIAGNOSIS — Z6839 Body mass index (BMI) 39.0-39.9, adult: Secondary | ICD-10-CM | POA: Diagnosis not present

## 2017-02-23 DIAGNOSIS — I10 Essential (primary) hypertension: Secondary | ICD-10-CM | POA: Diagnosis not present

## 2017-02-23 DIAGNOSIS — E1143 Type 2 diabetes mellitus with diabetic autonomic (poly)neuropathy: Secondary | ICD-10-CM | POA: Diagnosis not present

## 2017-02-23 DIAGNOSIS — E1165 Type 2 diabetes mellitus with hyperglycemia: Secondary | ICD-10-CM | POA: Diagnosis not present

## 2017-02-23 DIAGNOSIS — F419 Anxiety disorder, unspecified: Secondary | ICD-10-CM | POA: Diagnosis not present

## 2017-02-23 DIAGNOSIS — K3184 Gastroparesis: Secondary | ICD-10-CM | POA: Diagnosis not present

## 2017-02-25 DIAGNOSIS — L97519 Non-pressure chronic ulcer of other part of right foot with unspecified severity: Secondary | ICD-10-CM | POA: Diagnosis not present

## 2017-02-25 DIAGNOSIS — L97513 Non-pressure chronic ulcer of other part of right foot with necrosis of muscle: Secondary | ICD-10-CM | POA: Diagnosis not present

## 2017-02-25 DIAGNOSIS — E11621 Type 2 diabetes mellitus with foot ulcer: Secondary | ICD-10-CM | POA: Diagnosis not present

## 2017-02-25 DIAGNOSIS — M86171 Other acute osteomyelitis, right ankle and foot: Secondary | ICD-10-CM | POA: Diagnosis not present

## 2017-02-26 ENCOUNTER — Encounter (HOSPITAL_COMMUNITY): Payer: Self-pay | Admitting: *Deleted

## 2017-02-26 ENCOUNTER — Telehealth: Payer: Self-pay | Admitting: Internal Medicine

## 2017-02-26 ENCOUNTER — Emergency Department (HOSPITAL_COMMUNITY)
Admission: EM | Admit: 2017-02-26 | Discharge: 2017-02-26 | Disposition: A | Discharge status: Home / Self Care | Payer: Medicare Other | Attending: Emergency Medicine | Admitting: Emergency Medicine

## 2017-02-26 DIAGNOSIS — Z6841 Body Mass Index (BMI) 40.0 and over, adult: Secondary | ICD-10-CM | POA: Diagnosis not present

## 2017-02-26 DIAGNOSIS — R410 Disorientation, unspecified: Secondary | ICD-10-CM | POA: Diagnosis not present

## 2017-02-26 DIAGNOSIS — R1013 Epigastric pain: Secondary | ICD-10-CM

## 2017-02-26 DIAGNOSIS — Z0289 Encounter for other administrative examinations: Secondary | ICD-10-CM | POA: Diagnosis not present

## 2017-02-26 DIAGNOSIS — G43A1 Cyclical vomiting, intractable: Secondary | ICD-10-CM | POA: Diagnosis not present

## 2017-02-26 DIAGNOSIS — K219 Gastro-esophageal reflux disease without esophagitis: Secondary | ICD-10-CM | POA: Diagnosis not present

## 2017-02-26 DIAGNOSIS — N179 Acute kidney failure, unspecified: Secondary | ICD-10-CM | POA: Diagnosis not present

## 2017-02-26 DIAGNOSIS — R4182 Altered mental status, unspecified: Secondary | ICD-10-CM | POA: Diagnosis not present

## 2017-02-26 DIAGNOSIS — E1143 Type 2 diabetes mellitus with diabetic autonomic (poly)neuropathy: Secondary | ICD-10-CM | POA: Diagnosis not present

## 2017-02-26 DIAGNOSIS — R110 Nausea: Secondary | ICD-10-CM | POA: Insufficient documentation

## 2017-02-26 DIAGNOSIS — I10 Essential (primary) hypertension: Secondary | ICD-10-CM | POA: Diagnosis not present

## 2017-02-26 DIAGNOSIS — K3184 Gastroparesis: Secondary | ICD-10-CM | POA: Diagnosis not present

## 2017-02-26 DIAGNOSIS — K760 Fatty (change of) liver, not elsewhere classified: Secondary | ICD-10-CM | POA: Diagnosis not present

## 2017-02-26 DIAGNOSIS — R109 Unspecified abdominal pain: Secondary | ICD-10-CM | POA: Diagnosis present

## 2017-02-26 DIAGNOSIS — E860 Dehydration: Secondary | ICD-10-CM | POA: Diagnosis not present

## 2017-02-26 DIAGNOSIS — E1165 Type 2 diabetes mellitus with hyperglycemia: Secondary | ICD-10-CM | POA: Diagnosis not present

## 2017-02-26 DIAGNOSIS — G43A0 Cyclical vomiting, not intractable: Secondary | ICD-10-CM | POA: Diagnosis not present

## 2017-02-26 DIAGNOSIS — I129 Hypertensive chronic kidney disease with stage 1 through stage 4 chronic kidney disease, or unspecified chronic kidney disease: Secondary | ICD-10-CM | POA: Diagnosis not present

## 2017-02-26 DIAGNOSIS — R112 Nausea with vomiting, unspecified: Secondary | ICD-10-CM | POA: Diagnosis not present

## 2017-02-26 LAB — CBG MONITORING, ED: Glucose-Capillary: 351 mg/dL — ABNORMAL HIGH (ref 65–99)

## 2017-02-26 MED ORDER — DICYCLOMINE HCL 10 MG PO CAPS
10.0000 mg | ORAL_CAPSULE | Freq: Once | ORAL | Status: AC
  Administered 2017-02-26: 10 mg via ORAL
  Filled 2017-02-26: qty 1

## 2017-02-26 MED ORDER — HYDROMORPHONE HCL 1 MG/ML IJ SOLN
1.0000 mg | Freq: Once | INTRAMUSCULAR | Status: DC
  Filled 2017-02-26: qty 1

## 2017-02-26 MED ORDER — HYDROMORPHONE HCL 1 MG/ML IJ SOLN
1.0000 mg | Freq: Once | INTRAMUSCULAR | Status: AC
  Administered 2017-02-26: 1 mg via INTRAMUSCULAR

## 2017-02-26 MED ORDER — METOCLOPRAMIDE HCL 5 MG/5ML PO SOLN
10.0000 mg | Freq: Once | ORAL | Status: AC
  Administered 2017-02-26: 10 mg via ORAL
  Filled 2017-02-26: qty 10

## 2017-02-26 MED ORDER — SODIUM CHLORIDE 0.9 % IV BOLUS (SEPSIS)
1000.0000 mL | Freq: Once | INTRAVENOUS | Status: AC
  Administered 2017-02-26: 1000 mL via INTRAVENOUS

## 2017-02-26 MED ORDER — LORAZEPAM 1 MG PO TABS
1.0000 mg | ORAL_TABLET | Freq: Once | ORAL | Status: AC
  Administered 2017-02-26: 1 mg via ORAL
  Filled 2017-02-26: qty 1

## 2017-02-26 NOTE — ED Triage Notes (Signed)
Abdominal cramping

## 2017-02-26 NOTE — ED Notes (Signed)
Pt called out to this RN stating he accidentally pulled out his IV.

## 2017-02-26 NOTE — Telephone Encounter (Signed)
Noted  

## 2017-02-26 NOTE — Telephone Encounter (Signed)
PT is at the ED now.

## 2017-02-26 NOTE — ED Provider Notes (Signed)
Emergency Department Provider Note   I have reviewed the triage vital signs and the nursing notes.   HISTORY  Chief Complaint Abdominal Pain   HPI TRIGGER FRASIER is a 46 y.o. male with PMH of GERD, DM, and gastroparesis with chronic abdominal pain presents to the emergency department for evaluation of an acute exacerbation of his chronic abdominal pain. He describes it as an epigastric cramping sensation that radiates up into his chest and throat. Symptoms seem worse in the morning and he has pain almost every day. He feels that his pain has become significantly worse over the last month. He has a follow-up appointment with the gastroenterologist tomorrow morning at 10:30 AM. He states that the radiation into his chest is typical of his gastroparesis. There is no new or different quality or character. He denies any fevers or chills. No medication changes. He has Reglan, Bentyl, and Carafate at home which he tried with little to no relief. No vomiting this AM. No diarrhea. No radiation of pain.   Past Medical History:  Diagnosis Date  . Chronic abdominal pain   . Esophagitis   . Eye hemorrhage   . Gastritis   . Gastroparesis   . GERD (gastroesophageal reflux disease)   . Glaucoma   . Helicobacter pylori (H. pylori) infection   . Hiccups   . Hypertension   . Nausea and vomiting    chronic, recurrent  . Type 2 diabetes mellitus with complications (HCC)    diagnosed around age 21    Patient Active Problem List   Diagnosis Date Noted  . Pressure injury of skin 02/01/2017  . Psychosis 01/31/2017  . Hypokalemia 01/31/2017  . AKI (acute kidney injury) ([place]) 09/07/2016  . Insulin dependent diabetes mellitus ([place]) 09/07/2016  . Orthostatic syncope 09/05/2016  . Syncope and collapse   . 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 Diarrhea 09/27/2015  . Dehydration 02/17/2015  . Gastroparesis due to DM ([place]) 02/06/2015  . Intractable vomiting 01/15/2015  . Gastritis   . Essential  hypertension 12/06/2014  . H. pylori infection 11/27/2014  . Acute esophagitis 10/29/2014  . Abdominal pain, epigastric   . Mucosal abnormality of stomach   . Mucosal abnormality of esophagus   . Nausea vomiting and diarrhea 10/25/2014  . DIABETIC FOOT ULCER 02/14/2010  . LIMB PAIN 12/11/2009  . UNSPECIFIED PERIPHERAL VASCULAR DISEASE 10/19/2009  . ERECTILE DYSFUNCTION, ORGANIC 10/19/2009  . NUMBNESS 07/20/2009  . Diabetes mellitus type 2 with complications, uncontrolled ([place]) 03/22/2009  . HYPERLIPIDEMIA 03/22/2009  . DEPRESSIVE DISORDER 03/22/2009    Past Surgical History:  Procedure Laterality Date  . CATARACT EXTRACTION W/PHACO Left 05/19/2016   Procedure: CATARACT EXTRACTION PHACO AND INTRAOCULAR LENS PLACEMENT LEFT EYE;  Surgeon: Tonny Branch, MD;  Location: [facility];  Service: Ophthalmology;  Laterality: Left;  CDE: 7.30  . CATARACT EXTRACTION W/PHACO Right 06/23/2016   Procedure: CATARACT EXTRACTION PHACO AND INTRAOCULAR LENS PLACEMENT RIGHT EYE CDE=9.87;  Surgeon: Tonny Branch, MD;  Location: [facility];  Service: Ophthalmology;  Laterality: Right;  right  . ESOPHAGOGASTRODUODENOSCOPY  2015   Dr. Britta Mccreedy  . ESOPHAGOGASTRODUODENOSCOPY N/A 10/26/2014   RMR: Distal esophagititis-likely reflux related although an element of pill induced injuruy no exclueded  status post biopsy. Diffusely abnormal gastric mucosa of uncertain signigicane -status post gastric biopsy. Focal area of excoriation in the cardia most consistant with a trauma of heaving.   [name] EYE SURGERY  2015   stent placed to left eye  . EYE SURGERY  Current Outpatient Rx  . Order #: [id number] Class: Historical Med  . Order #: [id number] Class: Historical Med  . Order #: [id number] Class: Historical Med  . Order #: [id number] Class: Historical Med  . Order #: [id number] Class: Historical Med  . Order #: [id number] Class: Normal  . Order #: [id number] Class: Historical Med  . Order #: [id number] Class: Historical Med  . Order #:  [id number] Class: Historical Med  . Order #: [id number] Class: Historical Med  . Order #: [id number] Class: Historical Med  . Order #: [id number] Class: Historical Med  . Order #: [id number] Class: Print  . Order #: [id number] Class: Normal  . Order #: [id number] Class: Historical Med  . Order #: [id number] Class: Normal  . Order #: [id number] Class: Historical Med    Allergies Donnatal [pb-hyoscy-atropine-scopol er]; Fish allergy; Haldol [haloperidol lactate]; Lidocaine; Maalox [calcium carbonate antacid]; Aspirin; Bactrim [sulfamethoxazole-trimethoprim]; Doxycycline; Penicillins; and Phenergan [promethazine hcl]  Family History  Problem Relation Age of Onset  . Ovarian cancer Mother   . Cervical cancer Sister   . Diabetes Sister   . Colon cancer Neg Hx     Social History Social History  Substance Use Topics  . Smoking status: Never Smoker  . Smokeless tobacco: Never Used  . Alcohol use No    Review of Systems  Constitutional: No fever/chills Eyes: No visual changes. ENT: No sore throat. Cardiovascular: Positive midline chest pain. Respiratory: Denies shortness of breath. Gastrointestinal: Positive epigastric abdominal pain. Positive nausea, no vomiting.  No diarrhea.  No constipation. Genitourinary: Negative for dysuria. Musculoskeletal: Negative for back pain. Skin: Negative for rash. Neurological: Negative for headaches, focal weakness or numbness.  10-point ROS otherwise negative.  ____________________________________________   PHYSICAL EXAM:  VITAL SIGNS: ED Triage Vitals [02/26/17 1329]  Enc Vitals Group     BP (!) 151/93     Pulse Rate 96     Resp 20     Temp 99.1 F (37.3 C)     Temp Source Oral     SpO2 100 %     Pain Score 10   Constitutional: Alert and oriented. Well appearing and in no acute distress. Eyes: Conjunctivae are normal. Head: Atraumatic. Nose: No congestion/rhinnorhea. Mouth/Throat: Mucous membranes are moist.  Neck: No  stridor. Cardiovascular: Normal rate, regular rhythm. Good peripheral circulation. Grossly normal heart sounds.   Respiratory: Normal respiratory effort.  No retractions. Lungs CTAB. Gastrointestinal: Soft with focal mid-epigastric abdominal pain. No rebound or guarding. No distention.  Musculoskeletal: No lower extremity tenderness nor edema. No gross deformities of extremities. Neurologic:  Normal speech and language. No gross focal neurologic deficits are appreciated.  Skin:  Skin is warm, dry and intact. No rash noted.  ____________________________________________   PROCEDURES  Procedure(s) performed:   Procedures  None ____________________________________________   INITIAL IMPRESSION / ASSESSMENT AND PLAN / ED COURSE  Pertinent labs & imaging results that were available during my care of the patient were reviewed by me and considered in my medical decision making (see chart for details).  Patient resents to the emergency room in for evaluation of epigastric pain radiating to his chest that is cramping in quality. This feels exactly like his gastroparesis exacerbations. The patient has been seen multiple times this month with similar pain. He states it is not new or different pain. He had complete set of lab work and UA performed 5 days ago. No plan to repeat labs at this time. Will start IV and rehydrate along with PO reglan, IV ativan, and Bentyl. Patient has GI follow up this AM. Patient insists that chest discomfort  is typical of his gastroparesis. Will not pursue additional ACS w/u at this time.   04:44 PM Patient feeling much better. Plan for discharge with GI follow-up tomorrow.  At this time, I do not feel there is any life-threatening condition present. I have reviewed and discussed all results (EKG, imaging, lab, urine as appropriate), exam findings with patient. I have reviewed nursing notes and appropriate previous records.  I feel the patient is safe to be discharged  home without further emergent workup. Discussed usual and customary return precautions. Patient and family (if present) verbalize understanding and are comfortable with this plan.  Patient will follow-up with their primary care provider. If they do not have a primary care provider, information for follow-up has been provided to them. All questions have been answered.  ____________________________________________  FINAL CLINICAL IMPRESSION(S) / ED DIAGNOSES  Final diagnoses:  Epigastric pain  Nausea     MEDICATIONS GIVEN DURING THIS VISIT:  Medications  metoCLOPramide (REGLAN) 5 MG/5ML solution 10 mg (10 mg Oral Given 02/26/17 1412)  LORazepam (ATIVAN) tablet 1 mg (1 mg Oral Given 02/26/17 1410)  dicyclomine (BENTYL) capsule 10 mg (10 mg Oral Given 02/26/17 1410)  sodium chloride 0.9 % bolus 1,000 mL (1,000 mLs Intravenous New Bag/Given 02/26/17 1409)  HYDROmorphone (DILAUDID) injection 1 mg (1 mg Intramuscular Given 02/26/17 1545)     NEW OUTPATIENT MEDICATIONS STARTED DURING THIS VISIT:  None   Note:  This document was prepared using Dragon voice recognition software and may include unintentional dictation errors.  Nanda Quinton, MD Emergency Medicine    Long, [facility], MD 02/26/17 [phone]

## 2017-02-26 NOTE — Telephone Encounter (Signed)
Please call patient [phone], he is having stomach pain and wants to speak to someone

## 2017-02-26 NOTE — Discharge Instructions (Signed)

## 2017-02-27 ENCOUNTER — Ambulatory Visit: Payer: Medicare Other | Admitting: Gastroenterology

## 2017-02-27 DIAGNOSIS — Z886 Allergy status to analgesic agent status: Secondary | ICD-10-CM | POA: Diagnosis not present

## 2017-02-27 DIAGNOSIS — E1122 Type 2 diabetes mellitus with diabetic chronic kidney disease: Secondary | ICD-10-CM | POA: Diagnosis present

## 2017-02-27 DIAGNOSIS — E1143 Type 2 diabetes mellitus with diabetic autonomic (poly)neuropathy: Secondary | ICD-10-CM | POA: Diagnosis present

## 2017-02-27 DIAGNOSIS — G43A0 Cyclical vomiting, not intractable: Secondary | ICD-10-CM | POA: Diagnosis not present

## 2017-02-27 DIAGNOSIS — Z882 Allergy status to sulfonamides status: Secondary | ICD-10-CM | POA: Diagnosis not present

## 2017-02-27 DIAGNOSIS — I10 Essential (primary) hypertension: Secondary | ICD-10-CM | POA: Diagnosis not present

## 2017-02-27 DIAGNOSIS — Z6841 Body Mass Index (BMI) 40.0 and over, adult: Secondary | ICD-10-CM | POA: Diagnosis not present

## 2017-02-27 DIAGNOSIS — I493 Ventricular premature depolarization: Secondary | ICD-10-CM | POA: Diagnosis not present

## 2017-02-27 DIAGNOSIS — Z888 Allergy status to other drugs, medicaments and biological substances status: Secondary | ICD-10-CM | POA: Diagnosis not present

## 2017-02-27 DIAGNOSIS — E860 Dehydration: Secondary | ICD-10-CM | POA: Diagnosis present

## 2017-02-27 DIAGNOSIS — Z881 Allergy status to other antibiotic agents status: Secondary | ICD-10-CM | POA: Diagnosis not present

## 2017-02-27 DIAGNOSIS — E669 Obesity, unspecified: Secondary | ICD-10-CM | POA: Diagnosis present

## 2017-02-27 DIAGNOSIS — N189 Chronic kidney disease, unspecified: Secondary | ICD-10-CM | POA: Diagnosis present

## 2017-02-27 DIAGNOSIS — N179 Acute kidney failure, unspecified: Secondary | ICD-10-CM | POA: Diagnosis not present

## 2017-02-27 DIAGNOSIS — K3184 Gastroparesis: Secondary | ICD-10-CM | POA: Diagnosis not present

## 2017-02-27 DIAGNOSIS — R112 Nausea with vomiting, unspecified: Secondary | ICD-10-CM | POA: Diagnosis not present

## 2017-02-27 DIAGNOSIS — Z7984 Long term (current) use of oral hypoglycemic drugs: Secondary | ICD-10-CM | POA: Diagnosis not present

## 2017-02-27 DIAGNOSIS — I129 Hypertensive chronic kidney disease with stage 1 through stage 4 chronic kidney disease, or unspecified chronic kidney disease: Secondary | ICD-10-CM | POA: Diagnosis present

## 2017-02-27 DIAGNOSIS — Z880 Allergy status to penicillin: Secondary | ICD-10-CM | POA: Diagnosis not present

## 2017-02-27 DIAGNOSIS — Z713 Dietary counseling and surveillance: Secondary | ICD-10-CM | POA: Diagnosis not present

## 2017-02-27 DIAGNOSIS — E11621 Type 2 diabetes mellitus with foot ulcer: Secondary | ICD-10-CM | POA: Diagnosis present

## 2017-02-27 DIAGNOSIS — H5461 Unqualified visual loss, right eye, normal vision left eye: Secondary | ICD-10-CM | POA: Diagnosis present

## 2017-02-27 DIAGNOSIS — L97529 Non-pressure chronic ulcer of other part of left foot with unspecified severity: Secondary | ICD-10-CM | POA: Diagnosis present

## 2017-03-02 DIAGNOSIS — L97512 Non-pressure chronic ulcer of other part of right foot with fat layer exposed: Secondary | ICD-10-CM | POA: Diagnosis not present

## 2017-03-02 DIAGNOSIS — E1143 Type 2 diabetes mellitus with diabetic autonomic (poly)neuropathy: Secondary | ICD-10-CM | POA: Diagnosis not present

## 2017-03-02 DIAGNOSIS — K3184 Gastroparesis: Secondary | ICD-10-CM | POA: Diagnosis not present

## 2017-03-02 DIAGNOSIS — E1165 Type 2 diabetes mellitus with hyperglycemia: Secondary | ICD-10-CM | POA: Diagnosis not present

## 2017-03-02 DIAGNOSIS — E11621 Type 2 diabetes mellitus with foot ulcer: Secondary | ICD-10-CM | POA: Diagnosis not present

## 2017-03-02 DIAGNOSIS — I10 Essential (primary) hypertension: Secondary | ICD-10-CM | POA: Diagnosis not present

## 2017-03-04 DIAGNOSIS — E876 Hypokalemia: Secondary | ICD-10-CM | POA: Diagnosis not present

## 2017-03-04 DIAGNOSIS — I10 Essential (primary) hypertension: Secondary | ICD-10-CM | POA: Diagnosis not present

## 2017-03-04 DIAGNOSIS — Z6839 Body mass index (BMI) 39.0-39.9, adult: Secondary | ICD-10-CM | POA: Diagnosis not present

## 2017-03-04 DIAGNOSIS — E1143 Type 2 diabetes mellitus with diabetic autonomic (poly)neuropathy: Secondary | ICD-10-CM | POA: Diagnosis not present

## 2017-03-04 DIAGNOSIS — R002 Palpitations: Secondary | ICD-10-CM | POA: Diagnosis not present

## 2017-03-04 DIAGNOSIS — F419 Anxiety disorder, unspecified: Secondary | ICD-10-CM | POA: Diagnosis not present

## 2017-03-04 DIAGNOSIS — K3184 Gastroparesis: Secondary | ICD-10-CM | POA: Diagnosis not present

## 2017-03-05 DIAGNOSIS — E1143 Type 2 diabetes mellitus with diabetic autonomic (poly)neuropathy: Secondary | ICD-10-CM | POA: Diagnosis not present

## 2017-03-05 DIAGNOSIS — E1165 Type 2 diabetes mellitus with hyperglycemia: Secondary | ICD-10-CM | POA: Diagnosis not present

## 2017-03-05 DIAGNOSIS — L97512 Non-pressure chronic ulcer of other part of right foot with fat layer exposed: Secondary | ICD-10-CM | POA: Diagnosis not present

## 2017-03-05 DIAGNOSIS — K3184 Gastroparesis: Secondary | ICD-10-CM | POA: Diagnosis not present

## 2017-03-05 DIAGNOSIS — I10 Essential (primary) hypertension: Secondary | ICD-10-CM | POA: Diagnosis not present

## 2017-03-05 DIAGNOSIS — E11621 Type 2 diabetes mellitus with foot ulcer: Secondary | ICD-10-CM | POA: Diagnosis not present

## 2017-03-09 DIAGNOSIS — I10 Essential (primary) hypertension: Secondary | ICD-10-CM | POA: Diagnosis not present

## 2017-03-09 DIAGNOSIS — E11621 Type 2 diabetes mellitus with foot ulcer: Secondary | ICD-10-CM | POA: Diagnosis not present

## 2017-03-09 DIAGNOSIS — E1165 Type 2 diabetes mellitus with hyperglycemia: Secondary | ICD-10-CM | POA: Diagnosis not present

## 2017-03-09 DIAGNOSIS — L97512 Non-pressure chronic ulcer of other part of right foot with fat layer exposed: Secondary | ICD-10-CM | POA: Diagnosis not present

## 2017-03-09 DIAGNOSIS — E1143 Type 2 diabetes mellitus with diabetic autonomic (poly)neuropathy: Secondary | ICD-10-CM | POA: Diagnosis not present

## 2017-03-09 DIAGNOSIS — K3184 Gastroparesis: Secondary | ICD-10-CM | POA: Diagnosis not present

## 2017-03-11 DIAGNOSIS — E11621 Type 2 diabetes mellitus with foot ulcer: Secondary | ICD-10-CM | POA: Diagnosis not present

## 2017-03-11 DIAGNOSIS — M86671 Other chronic osteomyelitis, right ankle and foot: Secondary | ICD-10-CM | POA: Diagnosis not present

## 2017-03-11 DIAGNOSIS — L97519 Non-pressure chronic ulcer of other part of right foot with unspecified severity: Secondary | ICD-10-CM | POA: Diagnosis not present

## 2017-03-11 DIAGNOSIS — L97513 Non-pressure chronic ulcer of other part of right foot with necrosis of muscle: Secondary | ICD-10-CM | POA: Diagnosis not present

## 2017-03-11 DIAGNOSIS — E1169 Type 2 diabetes mellitus with other specified complication: Secondary | ICD-10-CM | POA: Diagnosis not present

## 2017-03-13 DIAGNOSIS — E1143 Type 2 diabetes mellitus with diabetic autonomic (poly)neuropathy: Secondary | ICD-10-CM | POA: Diagnosis not present

## 2017-03-13 DIAGNOSIS — L97512 Non-pressure chronic ulcer of other part of right foot with fat layer exposed: Secondary | ICD-10-CM | POA: Diagnosis not present

## 2017-03-13 DIAGNOSIS — E1165 Type 2 diabetes mellitus with hyperglycemia: Secondary | ICD-10-CM | POA: Diagnosis not present

## 2017-03-13 DIAGNOSIS — I10 Essential (primary) hypertension: Secondary | ICD-10-CM | POA: Diagnosis not present

## 2017-03-13 DIAGNOSIS — E11621 Type 2 diabetes mellitus with foot ulcer: Secondary | ICD-10-CM | POA: Diagnosis not present

## 2017-03-13 DIAGNOSIS — K3184 Gastroparesis: Secondary | ICD-10-CM | POA: Diagnosis not present

## 2017-03-16 DIAGNOSIS — E113591 Type 2 diabetes mellitus with proliferative diabetic retinopathy without macular edema, right eye: Secondary | ICD-10-CM | POA: Diagnosis not present

## 2017-03-16 DIAGNOSIS — K3184 Gastroparesis: Secondary | ICD-10-CM | POA: Diagnosis not present

## 2017-03-16 DIAGNOSIS — E1143 Type 2 diabetes mellitus with diabetic autonomic (poly)neuropathy: Secondary | ICD-10-CM | POA: Diagnosis not present

## 2017-03-16 DIAGNOSIS — L97512 Non-pressure chronic ulcer of other part of right foot with fat layer exposed: Secondary | ICD-10-CM | POA: Diagnosis not present

## 2017-03-16 DIAGNOSIS — E113512 Type 2 diabetes mellitus with proliferative diabetic retinopathy with macular edema, left eye: Secondary | ICD-10-CM | POA: Diagnosis not present

## 2017-03-16 DIAGNOSIS — H35372 Puckering of macula, left eye: Secondary | ICD-10-CM | POA: Diagnosis not present

## 2017-03-16 DIAGNOSIS — I10 Essential (primary) hypertension: Secondary | ICD-10-CM | POA: Diagnosis not present

## 2017-03-16 DIAGNOSIS — E11621 Type 2 diabetes mellitus with foot ulcer: Secondary | ICD-10-CM | POA: Diagnosis not present

## 2017-03-16 DIAGNOSIS — E1165 Type 2 diabetes mellitus with hyperglycemia: Secondary | ICD-10-CM | POA: Diagnosis not present

## 2017-03-19 DIAGNOSIS — E1165 Type 2 diabetes mellitus with hyperglycemia: Secondary | ICD-10-CM | POA: Diagnosis not present

## 2017-03-19 DIAGNOSIS — L97512 Non-pressure chronic ulcer of other part of right foot with fat layer exposed: Secondary | ICD-10-CM | POA: Diagnosis not present

## 2017-03-19 DIAGNOSIS — M86171 Other acute osteomyelitis, right ankle and foot: Secondary | ICD-10-CM | POA: Diagnosis not present

## 2017-03-19 DIAGNOSIS — E1143 Type 2 diabetes mellitus with diabetic autonomic (poly)neuropathy: Secondary | ICD-10-CM | POA: Diagnosis not present

## 2017-03-19 DIAGNOSIS — K3184 Gastroparesis: Secondary | ICD-10-CM | POA: Diagnosis not present

## 2017-03-19 DIAGNOSIS — I10 Essential (primary) hypertension: Secondary | ICD-10-CM | POA: Diagnosis not present

## 2017-03-19 DIAGNOSIS — E11621 Type 2 diabetes mellitus with foot ulcer: Secondary | ICD-10-CM | POA: Diagnosis not present

## 2017-03-19 DIAGNOSIS — L03115 Cellulitis of right lower limb: Secondary | ICD-10-CM | POA: Diagnosis not present

## 2017-03-19 DIAGNOSIS — E113599 Type 2 diabetes mellitus with proliferative diabetic retinopathy without macular edema, unspecified eye: Secondary | ICD-10-CM | POA: Diagnosis not present

## 2017-03-19 DIAGNOSIS — M60871 Other myositis, right ankle and foot: Secondary | ICD-10-CM | POA: Diagnosis not present

## 2017-03-19 DIAGNOSIS — L97519 Non-pressure chronic ulcer of other part of right foot with unspecified severity: Secondary | ICD-10-CM | POA: Diagnosis not present

## 2017-03-20 DIAGNOSIS — K3184 Gastroparesis: Secondary | ICD-10-CM | POA: Diagnosis not present

## 2017-03-20 DIAGNOSIS — I10 Essential (primary) hypertension: Secondary | ICD-10-CM | POA: Diagnosis not present

## 2017-03-20 DIAGNOSIS — L97512 Non-pressure chronic ulcer of other part of right foot with fat layer exposed: Secondary | ICD-10-CM | POA: Diagnosis not present

## 2017-03-20 DIAGNOSIS — E1143 Type 2 diabetes mellitus with diabetic autonomic (poly)neuropathy: Secondary | ICD-10-CM | POA: Diagnosis not present

## 2017-03-20 DIAGNOSIS — E11621 Type 2 diabetes mellitus with foot ulcer: Secondary | ICD-10-CM | POA: Diagnosis not present

## 2017-03-20 DIAGNOSIS — E1165 Type 2 diabetes mellitus with hyperglycemia: Secondary | ICD-10-CM | POA: Diagnosis not present

## 2017-03-23 ENCOUNTER — Other Ambulatory Visit: Payer: Self-pay | Admitting: Nurse Practitioner

## 2017-03-23 DIAGNOSIS — K3184 Gastroparesis: Secondary | ICD-10-CM | POA: Diagnosis not present

## 2017-03-23 DIAGNOSIS — E1143 Type 2 diabetes mellitus with diabetic autonomic (poly)neuropathy: Secondary | ICD-10-CM | POA: Diagnosis not present

## 2017-03-23 DIAGNOSIS — L97512 Non-pressure chronic ulcer of other part of right foot with fat layer exposed: Secondary | ICD-10-CM | POA: Diagnosis not present

## 2017-03-23 DIAGNOSIS — E11621 Type 2 diabetes mellitus with foot ulcer: Secondary | ICD-10-CM | POA: Diagnosis not present

## 2017-03-23 DIAGNOSIS — E1165 Type 2 diabetes mellitus with hyperglycemia: Secondary | ICD-10-CM | POA: Diagnosis not present

## 2017-03-23 DIAGNOSIS — I10 Essential (primary) hypertension: Secondary | ICD-10-CM | POA: Diagnosis not present

## 2017-03-25 DIAGNOSIS — M86671 Other chronic osteomyelitis, right ankle and foot: Secondary | ICD-10-CM | POA: Diagnosis not present

## 2017-03-25 DIAGNOSIS — L97513 Non-pressure chronic ulcer of other part of right foot with necrosis of muscle: Secondary | ICD-10-CM | POA: Diagnosis not present

## 2017-03-25 DIAGNOSIS — L97519 Non-pressure chronic ulcer of other part of right foot with unspecified severity: Secondary | ICD-10-CM | POA: Diagnosis not present

## 2017-03-25 DIAGNOSIS — E11621 Type 2 diabetes mellitus with foot ulcer: Secondary | ICD-10-CM | POA: Diagnosis not present

## 2017-03-25 DIAGNOSIS — E1169 Type 2 diabetes mellitus with other specified complication: Secondary | ICD-10-CM | POA: Diagnosis not present

## 2017-03-27 ENCOUNTER — Ambulatory Visit: Payer: Medicare Other | Admitting: Gastroenterology

## 2017-03-27 DIAGNOSIS — K3184 Gastroparesis: Secondary | ICD-10-CM | POA: Diagnosis not present

## 2017-03-27 DIAGNOSIS — E1143 Type 2 diabetes mellitus with diabetic autonomic (poly)neuropathy: Secondary | ICD-10-CM | POA: Diagnosis not present

## 2017-03-27 DIAGNOSIS — E11621 Type 2 diabetes mellitus with foot ulcer: Secondary | ICD-10-CM | POA: Diagnosis not present

## 2017-03-27 DIAGNOSIS — I10 Essential (primary) hypertension: Secondary | ICD-10-CM | POA: Diagnosis not present

## 2017-03-27 DIAGNOSIS — L97512 Non-pressure chronic ulcer of other part of right foot with fat layer exposed: Secondary | ICD-10-CM | POA: Diagnosis not present

## 2017-03-27 DIAGNOSIS — E1165 Type 2 diabetes mellitus with hyperglycemia: Secondary | ICD-10-CM | POA: Diagnosis not present

## 2017-03-30 DIAGNOSIS — E113512 Type 2 diabetes mellitus with proliferative diabetic retinopathy with macular edema, left eye: Secondary | ICD-10-CM | POA: Diagnosis not present

## 2017-03-31 DIAGNOSIS — I10 Essential (primary) hypertension: Secondary | ICD-10-CM | POA: Diagnosis not present

## 2017-03-31 DIAGNOSIS — L97513 Non-pressure chronic ulcer of other part of right foot with necrosis of muscle: Secondary | ICD-10-CM | POA: Diagnosis not present

## 2017-03-31 DIAGNOSIS — E1143 Type 2 diabetes mellitus with diabetic autonomic (poly)neuropathy: Secondary | ICD-10-CM | POA: Diagnosis not present

## 2017-03-31 DIAGNOSIS — M86671 Other chronic osteomyelitis, right ankle and foot: Secondary | ICD-10-CM | POA: Diagnosis not present

## 2017-03-31 DIAGNOSIS — E1169 Type 2 diabetes mellitus with other specified complication: Secondary | ICD-10-CM | POA: Diagnosis not present

## 2017-03-31 DIAGNOSIS — L97519 Non-pressure chronic ulcer of other part of right foot with unspecified severity: Secondary | ICD-10-CM | POA: Diagnosis not present

## 2017-03-31 DIAGNOSIS — E11621 Type 2 diabetes mellitus with foot ulcer: Secondary | ICD-10-CM | POA: Diagnosis not present

## 2017-03-31 DIAGNOSIS — F419 Anxiety disorder, unspecified: Secondary | ICD-10-CM | POA: Diagnosis not present

## 2017-03-31 DIAGNOSIS — K3184 Gastroparesis: Secondary | ICD-10-CM | POA: Diagnosis not present

## 2017-03-31 DIAGNOSIS — Z6839 Body mass index (BMI) 39.0-39.9, adult: Secondary | ICD-10-CM | POA: Diagnosis not present

## 2017-04-01 DIAGNOSIS — L97519 Non-pressure chronic ulcer of other part of right foot with unspecified severity: Secondary | ICD-10-CM | POA: Diagnosis not present

## 2017-04-01 DIAGNOSIS — L97513 Non-pressure chronic ulcer of other part of right foot with necrosis of muscle: Secondary | ICD-10-CM | POA: Diagnosis not present

## 2017-04-01 DIAGNOSIS — E11621 Type 2 diabetes mellitus with foot ulcer: Secondary | ICD-10-CM | POA: Diagnosis not present

## 2017-04-01 DIAGNOSIS — M86671 Other chronic osteomyelitis, right ankle and foot: Secondary | ICD-10-CM | POA: Diagnosis not present

## 2017-04-01 DIAGNOSIS — E1169 Type 2 diabetes mellitus with other specified complication: Secondary | ICD-10-CM | POA: Diagnosis not present

## 2017-04-06 ENCOUNTER — Encounter: Payer: Self-pay | Admitting: Infectious Diseases

## 2017-04-06 ENCOUNTER — Ambulatory Visit (INDEPENDENT_AMBULATORY_CARE_PROVIDER_SITE_OTHER): Payer: Medicare Other | Admitting: Infectious Diseases

## 2017-04-06 VITALS — BP 159/96 | HR 84 | Temp 97.8°F | Ht 72.0 in | Wt 304.0 lb

## 2017-04-06 DIAGNOSIS — L97414 Non-pressure chronic ulcer of right heel and midfoot with necrosis of bone: Secondary | ICD-10-CM

## 2017-04-06 DIAGNOSIS — M869 Osteomyelitis, unspecified: Secondary | ICD-10-CM | POA: Diagnosis not present

## 2017-04-06 NOTE — Patient Instructions (Signed)
Nice to meet you today. We will check lab work and get records of your last hospitalization to see what antibiotics we had you on.   Will discuss with Orthopedic surgery team about possibility for bone biopsy to determine what we are treating exactly.   Will see you back in 2 weeks to discuss further about your plan.

## 2017-04-06 NOTE — Progress Notes (Signed)
Patient Name: Timothy Clark  MRN: [id number]  DOB: [birth year]    Reason for Visit: Osteomyelitis   Referring Provider: Zada Girt ([place])  INFECTIOUS DISEASE OFFICE CONSULT SUBJECTIVE:  HPI: Timothy Clark is a 46 y.o. male diabetic patient who is here today for evaluation for management of osteomyelitis of his right forefoot. He has had a chronic ulcer involving the superior right hallux for at least 4 months now; previously with ulcer to ball of his foot about a year ago that is now healed. Recent Hgb A1c per office records was 9.1%. He continues to be ambulatory. He has been instructed to apply Iodosorb every 3 days and is currently working with home health team to manage his edema. He has in the past been treated with offloading of the wound and now is currently getting ready to undergo hyperbaric oxygen treatments.   Had been on IV antibiotics about 2 months ago for about 10-14 days when he was hospitalized at [facility]) for ulcer. Has had the wound on the top of his foot since March of this year. Previously the wound was on bottom of foot at the ball about 1 year ago. Blood flow in the legs normal with previous ABIs. Struggles with leg swelling daily. Today he endorses no concerning signs or symptoms for systemic involvement such as fevers, malaise, chills, decreased appetite or altered mental status.   Patient Active Problem List   Diagnosis Date Noted  . Pressure injury of skin 02/01/2017  . Psychosis 01/31/2017  . Hypokalemia 01/31/2017  . AKI (acute kidney injury) ([place]) 09/07/2016  . Insulin dependent diabetes mellitus ([place]) 09/07/2016  . Orthostatic syncope 09/05/2016  . Syncope and collapse   . 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 Diarrhea 09/27/2015  . Dehydration 02/17/2015  . Gastroparesis due to DM ([place]) 02/06/2015  . Intractable vomiting 01/15/2015  . Gastritis   . Essential hypertension 12/06/2014  . H. pylori  infection 11/27/2014  . Acute esophagitis 10/29/2014  . Abdominal pain, epigastric   . Mucosal abnormality of stomach   . Mucosal abnormality of esophagus   . Nausea vomiting and diarrhea 10/25/2014  . DIABETIC FOOT ULCER 02/14/2010  . LIMB PAIN 12/11/2009  . UNSPECIFIED PERIPHERAL VASCULAR DISEASE 10/19/2009  . ERECTILE DYSFUNCTION, ORGANIC 10/19/2009  . NUMBNESS 07/20/2009  . Diabetes mellitus type 2 with complications, uncontrolled ([place]) 03/22/2009  . HYPERLIPIDEMIA 03/22/2009  . DEPRESSIVE DISORDER 03/22/2009    Patient's Medications  New Prescriptions   No medications on file  Previous Medications   AMLODIPINE (NORVASC) 5 MG TABLET    Take 5 mg by mouth daily.   ATORVASTATIN (LIPITOR) 10 MG TABLET    Take 10 mg by mouth daily.   BRIMONIDINE (ALPHAGAN) 0.15 % OPHTHALMIC SOLUTION    Place 1 drop into both eyes 2 (two) times daily.    CETIRIZINE (ZYRTEC) 10 MG TABLET    Take 10 mg by mouth daily.   CYCLOBENZAPRINE (FLEXERIL) 10 MG TABLET    Take 10 mg by mouth at bedtime as needed for muscle spasms.    DICYCLOMINE (BENTYL) 10 MG CAPSULE    Take 1 capsule (10 mg total) by mouth 4 (four) times daily -  before meals and at bedtime.   GABAPENTIN (NEURONTIN) 600 MG TABLET    Take 600 mg by mouth 4 (four) times daily as needed (for neuropathy).    HYDROCHLOROTHIAZIDE (HYDRODIURIL) 25 MG TABLET    Take 25 mg by mouth daily.  INSULIN DETEMIR (LEVEMIR) 100 UNIT/ML INJECTION    Inject 20 Units into the skin at bedtime as needed.    INSULIN NPH-REGULAR HUMAN (NOVOLIN 70/30) (70-30) 100 UNIT/ML INJECTION    Inject 35 Units into the skin 2 (two) times daily.   LISINOPRIL (PRINIVIL,ZESTRIL) 40 MG TABLET    Take 40 mg by mouth daily.   LORAZEPAM (ATIVAN) 0.5 MG TABLET    Take 0.5 mg by mouth at bedtime as needed for anxiety.   METOCLOPRAMIDE (REGLAN) 10 MG TABLET    Take 1 tablet (10 mg total) by mouth every 6 (six) hours as needed for nausea (nausea/headache).   PANTOPRAZOLE (PROTONIX) 40 MG  TABLET    TAKE 1 TABLET BY MOUTH TWICE DAILY BEFORE  A  MEAL   SITAGLIPTIN (JANUVIA) 100 MG TABLET    Take 100 mg by mouth daily.   SUCRALFATE (CARAFATE) 1 G TABLET    TAKE ONE TABLET BY MOUTH 4 TIMES DAILY   TIMOLOL (BETIMOL) 0.5 % OPHTHALMIC SOLUTION    Place 1 drop into both eyes 2 (two) times daily.  Modified Medications   No medications on file  Discontinued Medications   No medications on file    Review of Systems  Constitutional: Negative for chills, fever, malaise/fatigue and weight loss.  HENT: Negative for sore throat.        No dental problems  Respiratory: Negative for cough and sputum production.   Cardiovascular: Positive for leg swelling. Negative for chest pain.  Gastrointestinal: Negative for abdominal pain, diarrhea and vomiting.  Genitourinary: Negative for dysuria and flank pain.  Musculoskeletal: Negative for joint pain and myalgias.       Ulcer to right foot    Skin: Positive for rash (flaking rash to shins).  Neurological: Negative for dizziness, tingling and headaches.  Endo/Heme/Allergies: Positive for polydipsia.    Past Medical History:  Diagnosis Date  . Chronic abdominal pain   . Esophagitis   . Eye hemorrhage   . Gastritis   . Gastroparesis   . GERD (gastroesophageal reflux disease)   . Glaucoma   . Helicobacter pylori (H. pylori) infection   . Hiccups   . Hypertension   . Nausea and vomiting    chronic, recurrent  . Type 2 diabetes mellitus with complications (HCC)    diagnosed around age 45    Social History  Substance Use Topics  . Smoking status: Never Smoker  . Smokeless tobacco: Never Used  . Alcohol use No    Family History  Problem Relation Age of Onset  . Ovarian cancer Mother   . Cervical cancer Sister   . Diabetes Sister   . Colon cancer Neg Hx      Allergies  Allergen Reactions  . Donnatal [Pb-Hyoscy-Atropine-Scopol Er] Anaphylaxis    Reaction to GI cocktail  . Fish Allergy Anaphylaxis, Hives and Rash  . Haldol  [Haloperidol Lactate] Other (See Comments)    Chest Pain  . Lidocaine Anaphylaxis    Reaction to GI cocktail  . Maalox [Calcium Carbonate Antacid] Anaphylaxis    Reaction to GI cocktail  . Aspirin Hives and Itching  . Bactrim [Sulfamethoxazole-Trimethoprim] Itching  . Doxycycline Hives and Itching  . Penicillins Hives and Itching    Has patient had a PCN reaction causing immediate rash, facial/tongue/throat swelling, SOB or lightheadedness with hypotension: yes Has patient had a PCN reaction causing severe rash involving mucus membranes or skin necrosis: yes Has patient had a PCN reaction that required hospitalization no Has patient had  a PCN reaction occurring within the last 10 years: yes If all of the above answers are "NO", then may proceed with Cephalospor  . Phenergan [Promethazine Hcl] Nausea And Vomiting     OBJECTIVE: Vitals:   04/06/17 1427  BP: (!) 159/96  Pulse: 84  Temp: 97.8 F (36.6 C)  TempSrc: Oral  Weight: (!) 304 lb (137.9 kg)  Height: 6' (1.829 m)   Body mass index is 41.23 kg/m.  Physical Exam  Constitutional: He is oriented to person, place, and time and well-developed, well-nourished, and in no distress.  HENT:  Mouth/Throat: Oropharynx is clear and moist.  Eyes:  Lazy eye with outward gaze (right)  Cardiovascular: Normal rate, normal heart sounds and intact distal pulses.   No murmur heard. Pulmonary/Chest: Effort normal and breath sounds normal.  Abdominal: Soft. Bowel sounds are normal. He exhibits no distension.  Musculoskeletal:  2+ edema through knees bilaterally. Does have scaling rash with small nodules on anterior shins.   Foot wound as pictured below  Neurological: He is alert and oriented to person, place, and time.  Skin: Skin is warm and dry.  Warm and well perfused. Decreased distal sensation to bilateral feet on underside of foot. Indistinguishable sharp/dull pain. Thickened black toenails and skin.   Psychiatric: Affect normal.      LABS & DIAGNOSTICS:  MRI 02/25/17 Right Forefoot W/O Contrast: Soft tissue ulceration involving the medial aspect of forefoot at the level of the first MTP joint. There is underlying osteomyelitis involving the first metatarsal head. I do not see any definite findings for septic arthritis. Moderate degenerative changes noted. Surrounding cellulitis w/o myofasciitis w/o drainable soft tissue abscess or pyomyositis. The other bony structures appear intact. The medial and lateral sesamoids of the great toe are intact.  ASSESSMENT & PLAN:  Problem List Items Addressed This Visit      Musculoskeletal and Integument   DIABETIC FOOT ULCER    Chronic ulcer with underlying osteomyelitis of #6metarsal to right foot. He has been treated in the past with 2 weeks parenteral therapy - do not believe it was known he had OM at this time; likely only treated for wound / cellulitis. No cellulitis present today on exam. He will need evaluation with podiatry team to facilitate possibility of bone biopsy - he has multiple allergies to antibiotics and would be best to get targeted therapy to pathogen ID to limit side effects of unnecessary antibiotics. Will obtain pre-treatment CRP/Sed Rate today along with CBC w/ dif, CMET.   He has some work to do on his glycemic control. Foot pulses easily palpated - suspect more venous insufficiency vs arterial disease. Reported normal ABIs in the past. Will request records from [facility] regarding previous abx treatment.   Depending on what Podiatry says will need 6-8 weeks IV antibiotics to treat osteomyelitis infection. If unable to do bone biopsy would likely start with Vanco/Ceftriaxone +/- Flagyl for anaerobic coverage. Encouraged him to continue working with wound care center - he is undergoing hyperbaric treatments and although it may not be helpful in the long run I do not feel as it will hurt his situation by any means. He will follow up with me after labs are  obtained and podiatry weights in. Instructed to notify clinic should he develop worsening symptoms or fevers/chills as it will require empiric therapy.        Other Visit Diagnoses    Osteomyelitis, unspecified site, unspecified type ([place])    -  Primary  Relevant Orders   C-reactive protein (Completed)   Sedimentation rate (Completed)   COMPLETE METABOLIC PANEL WITH GFR   CBC with Differential/Platelet   Culture, blood (single) (Completed)   Culture, blood (single) (Completed)   Ambulatory referral to [place], MSN, [place] [facility]  04/07/2017  1:35 PM

## 2017-04-07 ENCOUNTER — Telehealth: Payer: Self-pay | Admitting: *Deleted

## 2017-04-07 ENCOUNTER — Telehealth: Payer: Self-pay

## 2017-04-07 DIAGNOSIS — L97519 Non-pressure chronic ulcer of other part of right foot with unspecified severity: Secondary | ICD-10-CM | POA: Diagnosis not present

## 2017-04-07 DIAGNOSIS — E1169 Type 2 diabetes mellitus with other specified complication: Secondary | ICD-10-CM | POA: Diagnosis not present

## 2017-04-07 DIAGNOSIS — E11621 Type 2 diabetes mellitus with foot ulcer: Secondary | ICD-10-CM | POA: Diagnosis not present

## 2017-04-07 DIAGNOSIS — L97513 Non-pressure chronic ulcer of other part of right foot with necrosis of muscle: Secondary | ICD-10-CM | POA: Diagnosis not present

## 2017-04-07 DIAGNOSIS — M86671 Other chronic osteomyelitis, right ankle and foot: Secondary | ICD-10-CM | POA: Diagnosis not present

## 2017-04-07 LAB — SEDIMENTATION RATE: Sed Rate: 74 mm/hr — ABNORMAL HIGH (ref 0–15)

## 2017-04-07 LAB — C-REACTIVE PROTEIN: CRP: 2.1 mg/L (ref <8.0)

## 2017-04-07 NOTE — Telephone Encounter (Signed)
Called patient and left a voice mail to inform him of appt at [facility] [place] on 04/22/17 at 9:45 AM. Asked that he call me back to confirm that he received this appt information. Myrtis Hopping

## 2017-04-07 NOTE — Telephone Encounter (Signed)
I called [name] to inform him of an appointment at [facility] on 04/22/17 at 9:45 am. He said he would reschedule his appointment at the wound center so he can make the appointment at [facility]. He verified the appointment back to me and said he can make the appointment.

## 2017-04-07 NOTE — Assessment & Plan Note (Addendum)
Chronic ulcer with underlying osteomyelitis of #40metarsal to right foot. He has been treated in the past with 2 weeks parenteral therapy - do not believe it was known he had OM at this time; likely only treated for wound / cellulitis. No cellulitis present today on exam. He will need evaluation with podiatry team to facilitate possibility of bone biopsy - he has multiple allergies to antibiotics and would be best to get targeted therapy to pathogen ID to limit side effects of unnecessary antibiotics. Will obtain pre-treatment CRP/Sed Rate today along with CBC w/ dif, CMET.   He has some work to do on his glycemic control. Foot pulses easily palpated - suspect more venous insufficiency vs arterial disease. Reported normal ABIs in the past. Will request records from [facility] regarding previous abx treatment.   Depending on what Podiatry says will need 6-8 weeks IV antibiotics to treat osteomyelitis infection. If unable to do bone biopsy would likely start with Vanco/Ceftriaxone +/- Flagyl for anaerobic coverage. Encouraged him to continue working with wound care center - he is undergoing hyperbaric treatments and although it may not be helpful in the long run I do not feel as it will hurt his situation by any means. He will follow up with me after labs are obtained and podiatry weights in. Instructed to notify clinic should he develop worsening symptoms or fevers/chills as it will require empiric therapy.

## 2017-04-08 DIAGNOSIS — L97519 Non-pressure chronic ulcer of other part of right foot with unspecified severity: Secondary | ICD-10-CM | POA: Diagnosis not present

## 2017-04-08 DIAGNOSIS — E1169 Type 2 diabetes mellitus with other specified complication: Secondary | ICD-10-CM | POA: Diagnosis not present

## 2017-04-08 DIAGNOSIS — E11621 Type 2 diabetes mellitus with foot ulcer: Secondary | ICD-10-CM | POA: Diagnosis not present

## 2017-04-08 DIAGNOSIS — M86671 Other chronic osteomyelitis, right ankle and foot: Secondary | ICD-10-CM | POA: Diagnosis not present

## 2017-04-08 DIAGNOSIS — L97513 Non-pressure chronic ulcer of other part of right foot with necrosis of muscle: Secondary | ICD-10-CM | POA: Diagnosis not present

## 2017-04-08 NOTE — Telephone Encounter (Signed)
Patient aware of appointment

## 2017-04-09 DIAGNOSIS — L97513 Non-pressure chronic ulcer of other part of right foot with necrosis of muscle: Secondary | ICD-10-CM | POA: Diagnosis not present

## 2017-04-09 DIAGNOSIS — M86671 Other chronic osteomyelitis, right ankle and foot: Secondary | ICD-10-CM | POA: Diagnosis not present

## 2017-04-09 DIAGNOSIS — E1169 Type 2 diabetes mellitus with other specified complication: Secondary | ICD-10-CM | POA: Diagnosis not present

## 2017-04-09 DIAGNOSIS — L97519 Non-pressure chronic ulcer of other part of right foot with unspecified severity: Secondary | ICD-10-CM | POA: Diagnosis not present

## 2017-04-09 DIAGNOSIS — E11621 Type 2 diabetes mellitus with foot ulcer: Secondary | ICD-10-CM | POA: Diagnosis not present

## 2017-04-12 LAB — CULTURE, BLOOD (SINGLE)
Organism ID, Bacteria: NO GROWTH
Organism ID, Bacteria: NO GROWTH

## 2017-04-14 DIAGNOSIS — E11621 Type 2 diabetes mellitus with foot ulcer: Secondary | ICD-10-CM | POA: Diagnosis not present

## 2017-04-14 DIAGNOSIS — L97519 Non-pressure chronic ulcer of other part of right foot with unspecified severity: Secondary | ICD-10-CM | POA: Diagnosis not present

## 2017-04-14 DIAGNOSIS — M86671 Other chronic osteomyelitis, right ankle and foot: Secondary | ICD-10-CM | POA: Diagnosis not present

## 2017-04-14 DIAGNOSIS — L97513 Non-pressure chronic ulcer of other part of right foot with necrosis of muscle: Secondary | ICD-10-CM | POA: Diagnosis not present

## 2017-04-14 DIAGNOSIS — E1169 Type 2 diabetes mellitus with other specified complication: Secondary | ICD-10-CM | POA: Diagnosis not present

## 2017-04-15 DIAGNOSIS — L97519 Non-pressure chronic ulcer of other part of right foot with unspecified severity: Secondary | ICD-10-CM | POA: Diagnosis not present

## 2017-04-15 DIAGNOSIS — M86671 Other chronic osteomyelitis, right ankle and foot: Secondary | ICD-10-CM | POA: Diagnosis not present

## 2017-04-15 DIAGNOSIS — L97513 Non-pressure chronic ulcer of other part of right foot with necrosis of muscle: Secondary | ICD-10-CM | POA: Diagnosis not present

## 2017-04-15 DIAGNOSIS — E11621 Type 2 diabetes mellitus with foot ulcer: Secondary | ICD-10-CM | POA: Diagnosis not present

## 2017-04-15 DIAGNOSIS — E1169 Type 2 diabetes mellitus with other specified complication: Secondary | ICD-10-CM | POA: Diagnosis not present

## 2017-04-16 ENCOUNTER — Emergency Department (HOSPITAL_COMMUNITY)
Admission: EM | Admit: 2017-04-16 | Discharge: 2017-04-16 | Disposition: A | Discharge status: Home / Self Care | Payer: Medicare Other | Attending: Emergency Medicine | Admitting: Emergency Medicine

## 2017-04-16 ENCOUNTER — Encounter (HOSPITAL_COMMUNITY): Payer: Self-pay | Admitting: Emergency Medicine

## 2017-04-16 DIAGNOSIS — L97513 Non-pressure chronic ulcer of other part of right foot with necrosis of muscle: Secondary | ICD-10-CM | POA: Diagnosis not present

## 2017-04-16 DIAGNOSIS — E119 Type 2 diabetes mellitus without complications: Secondary | ICD-10-CM | POA: Insufficient documentation

## 2017-04-16 DIAGNOSIS — K3184 Gastroparesis: Secondary | ICD-10-CM | POA: Diagnosis not present

## 2017-04-16 DIAGNOSIS — R1084 Generalized abdominal pain: Secondary | ICD-10-CM | POA: Diagnosis present

## 2017-04-16 DIAGNOSIS — Z7984 Long term (current) use of oral hypoglycemic drugs: Secondary | ICD-10-CM | POA: Insufficient documentation

## 2017-04-16 DIAGNOSIS — E11621 Type 2 diabetes mellitus with foot ulcer: Secondary | ICD-10-CM | POA: Diagnosis not present

## 2017-04-16 DIAGNOSIS — Z79899 Other long term (current) drug therapy: Secondary | ICD-10-CM | POA: Insufficient documentation

## 2017-04-16 DIAGNOSIS — M86671 Other chronic osteomyelitis, right ankle and foot: Secondary | ICD-10-CM | POA: Diagnosis not present

## 2017-04-16 DIAGNOSIS — L97519 Non-pressure chronic ulcer of other part of right foot with unspecified severity: Secondary | ICD-10-CM | POA: Diagnosis not present

## 2017-04-16 DIAGNOSIS — I10 Essential (primary) hypertension: Secondary | ICD-10-CM | POA: Diagnosis not present

## 2017-04-16 DIAGNOSIS — E1169 Type 2 diabetes mellitus with other specified complication: Secondary | ICD-10-CM | POA: Diagnosis not present

## 2017-04-16 LAB — CBC
HCT: 33.1 % — ABNORMAL LOW (ref 39.0–52.0)
Hemoglobin: 11.8 g/dL — ABNORMAL LOW (ref 13.0–17.0)
MCH: 31.1 pg (ref 26.0–34.0)
MCHC: 35.6 g/dL (ref 30.0–36.0)
MCV: 87.3 fL (ref 78.0–100.0)
Platelets: 148 10*3/uL — ABNORMAL LOW (ref 150–400)
RBC: 3.79 MIL/uL — ABNORMAL LOW (ref 4.22–5.81)
RDW: 13.3 % (ref 11.5–15.5)
WBC: 9.7 10*3/uL (ref 4.0–10.5)

## 2017-04-16 LAB — URINALYSIS, ROUTINE W REFLEX MICROSCOPIC
Bacteria, UA: NONE SEEN
Bilirubin Urine: NEGATIVE
Glucose, UA: 50 mg/dL — AB
Ketones, ur: NEGATIVE mg/dL
Leukocytes, UA: NEGATIVE
Nitrite: NEGATIVE
Protein, ur: >=300 mg/dL — AB
Specific Gravity, Urine: 1.014 (ref 1.005–1.030)
Squamous Epithelial / HPF: NONE SEEN
pH: 7 (ref 5.0–8.0)

## 2017-04-16 LAB — COMPREHENSIVE METABOLIC PANEL
ALT: 16 U/L — ABNORMAL LOW (ref 17–63)
AST: 20 U/L (ref 15–41)
Albumin: 3.9 g/dL (ref 3.5–5.0)
Alkaline Phosphatase: 53 U/L (ref 38–126)
Anion gap: 9 (ref 5–15)
BUN: 20 mg/dL (ref 6–20)
CO2: 26 mmol/L (ref 22–32)
Calcium: 9.5 mg/dL (ref 8.9–10.3)
Chloride: 105 mmol/L (ref 101–111)
Creatinine, Ser: 1.27 mg/dL — ABNORMAL HIGH (ref 0.61–1.24)
GFR calc Af Amer: >60 mL/min (ref >60)
GFR calc non Af Amer: >60 mL/min (ref >60)
Glucose, Bld: 214 mg/dL — ABNORMAL HIGH (ref 65–99)
Potassium: 3.9 mmol/L (ref 3.5–5.1)
Sodium: 140 mmol/L (ref 135–145)
Total Bilirubin: 0.9 mg/dL (ref 0.3–1.2)
Total Protein: 7.6 g/dL (ref 6.5–8.1)

## 2017-04-16 LAB — RAPID URINE DRUG SCREEN, HOSP PERFORMED
Amphetamines: NOT DETECTED
Barbiturates: NOT DETECTED
Benzodiazepines: POSITIVE — AB
Cocaine: NOT DETECTED
Opiates: NOT DETECTED
Tetrahydrocannabinol: NOT DETECTED

## 2017-04-16 LAB — LIPASE, BLOOD: Lipase: 29 U/L (ref 11–51)

## 2017-04-16 MED ORDER — ONDANSETRON 4 MG PO TBDP
4.0000 mg | ORAL_TABLET | Freq: Once | ORAL | Status: AC | PRN
  Administered 2017-04-16: 4 mg via ORAL

## 2017-04-16 MED ORDER — OXYCODONE-ACETAMINOPHEN 5-325 MG PO TABS
1.0000 | ORAL_TABLET | Freq: Once | ORAL | Status: AC
  Administered 2017-04-16: 1 via ORAL
  Filled 2017-04-16: qty 1

## 2017-04-16 MED ORDER — METOCLOPRAMIDE HCL 5 MG/ML IJ SOLN
10.0000 mg | Freq: Once | INTRAMUSCULAR | Status: AC
  Administered 2017-04-16: 10 mg via INTRAMUSCULAR
  Filled 2017-04-16: qty 2

## 2017-04-16 MED ORDER — ONDANSETRON 4 MG PO TBDP
ORAL_TABLET | ORAL | Status: AC
  Filled 2017-04-16: qty 1

## 2017-04-16 MED ORDER — HYDROXYZINE HCL 50 MG/ML IM SOLN
50.0000 mg | Freq: Once | INTRAMUSCULAR | Status: AC
  Administered 2017-04-16: 50 mg via INTRAMUSCULAR
  Filled 2017-04-16: qty 1

## 2017-04-16 NOTE — ED Triage Notes (Addendum)
Pt reports was at wound clinic in hyperbaric chamber for "treatment of a foot ulcer ". Pt reports abd pain,nausea started during treatment and reports history of gastroparesis. Pt dry heaving in triage. Emesis bag provided.

## 2017-04-16 NOTE — ED Notes (Signed)
Lab notified by Mariann Laster, RN that pt does need lab draw at this time per EDP

## 2017-04-16 NOTE — Discharge Instructions (Signed)
Gradually advance your diet.  Continue taking your medications, as usual.  Make sure that you go to the appointment with the podiatrist, on September 12 as scheduled.  They are located at [address] in [place], [place].

## 2017-04-16 NOTE — ED Notes (Signed)
Phlebotomy unable to obtain labs due to pt unable to stay still

## 2017-04-16 NOTE — ED Notes (Signed)
Phlebotomy in to attempt blood draw

## 2017-04-16 NOTE — ED Notes (Signed)
Pt sleeping. 

## 2017-04-16 NOTE — ED Notes (Signed)
Pt stating he is throwing up but pt is only spitting up. Only spit in emesis bag.

## 2017-04-16 NOTE — ED Provider Notes (Signed)
[facility] DEPT Provider Note   CSN: [id number] Arrival date & time: 04/16/17  1245     History   Chief Complaint Chief Complaint  Patient presents with  . Abdominal Pain    HPI Timothy Clark is a 46 y.o. male.  He presents for vomiting, multiple times with subsequent epigastric abdominal pain, which started this morning while he was in the hyperbaric chamber.  He is been treated at the wound care center, for a left foot ulcer.  He has an upcoming appointment with a podiatrist, on 04/22/17, for evaluation and possible bone biopsy to evaluate for a chronic foot wound.  Currently he is being managed off antibiotics pending the biopsy, which is hopefully going to help direct the care.  The patient states his blood sugar has been running normal for him, and this morning was 158.  He has not had any fever, other episodes of vomiting recently, diarrhea, weakness or dizziness.  He has been treated with IM Reglan prior to my seeing him, and states that he feels better and nausea currently.  He typically uses Reglan and Bentyl at home for chronic symptoms of "gastroparesis."  There are no other no modifying factors.  HPI  Past Medical History:  Diagnosis Date  . Chronic abdominal pain   . Esophagitis   . Eye hemorrhage   . Gastritis   . Gastroparesis   . GERD (gastroesophageal reflux disease)   . Glaucoma   . Helicobacter pylori (H. pylori) infection   . Hiccups   . Hypertension   . Nausea and vomiting    chronic, recurrent  . Type 2 diabetes mellitus with complications (HCC)    diagnosed around age 46    Patient Active Problem List   Diagnosis Date Noted  . Pressure injury of skin 02/01/2017  . Psychosis 01/31/2017  . Hypokalemia 01/31/2017  . AKI (acute kidney injury) ([place]) 09/07/2016  . Insulin dependent diabetes mellitus ([place]) 09/07/2016  . Orthostatic syncope 09/05/2016  . Syncope and collapse   . 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 Diarrhea 09/27/2015  . Dehydration  02/17/2015  . Gastroparesis due to DM ([place]) 02/06/2015  . Intractable vomiting 01/15/2015  . Gastritis   . Essential hypertension 12/06/2014  . H. pylori infection 11/27/2014  . Acute esophagitis 10/29/2014  . Abdominal pain, epigastric   . Mucosal abnormality of stomach   . Mucosal abnormality of esophagus   . Nausea vomiting and diarrhea 10/25/2014  . DIABETIC FOOT ULCER 02/14/2010  . LIMB PAIN 12/11/2009  . UNSPECIFIED PERIPHERAL VASCULAR DISEASE 10/19/2009  . ERECTILE DYSFUNCTION, ORGANIC 10/19/2009  . NUMBNESS 07/20/2009  . Diabetes mellitus type 2 with complications, uncontrolled ([place]) 03/22/2009  . HYPERLIPIDEMIA 03/22/2009  . DEPRESSIVE DISORDER 03/22/2009    Past Surgical History:  Procedure Laterality Date  . CATARACT EXTRACTION W/PHACO Left 05/19/2016   Procedure: CATARACT EXTRACTION PHACO AND INTRAOCULAR LENS PLACEMENT LEFT EYE;  Surgeon: Tonny Branch, MD;  Location: [facility];  Service: Ophthalmology;  Laterality: Left;  CDE: 7.30  . CATARACT EXTRACTION W/PHACO Right 06/23/2016   Procedure: CATARACT EXTRACTION PHACO AND INTRAOCULAR LENS PLACEMENT RIGHT EYE CDE=9.87;  Surgeon: Tonny Branch, MD;  Location: [facility];  Service: Ophthalmology;  Laterality: Right;  right  . ESOPHAGOGASTRODUODENOSCOPY  2015   Dr. Britta Mccreedy  . ESOPHAGOGASTRODUODENOSCOPY N/A 10/26/2014   RMR: Distal esophagititis-likely reflux related although an element of pill induced injuruy no exclueded  status post biopsy. Diffusely abnormal gastric mucosa of uncertain signigicane -status post gastric biopsy. Focal area of excoriation  in the cardia most consistant with a trauma of heaving.   [name] EYE SURGERY  2015   stent placed to left eye  . EYE SURGERY         Home Medications    Prior to Admission medications   Medication Sig Start Date End Date Taking? Authorizing Provider  amLODipine (NORVASC) 5 MG tablet Take 5 mg by mouth daily.   Yes [provider]  atorvastatin (LIPITOR) 10 MG tablet Take 10 mg  by mouth daily.   Yes [provider]  brimonidine (ALPHAGAN) 0.15 % ophthalmic solution Place 1 drop into both eyes 2 (two) times daily.    Yes [provider]  cetirizine (ZYRTEC) 10 MG tablet Take 10 mg by mouth daily.   Yes [provider]  cyclobenzaprine (FLEXERIL) 10 MG tablet Take 10 mg by mouth at bedtime as needed for muscle spasms.    Yes [provider]  dicyclomine (BENTYL) 10 MG capsule Take 1 capsule (10 mg total) by mouth 4 (four) times daily -  before meals and at bedtime. Patient taking differently: Take 10 mg by mouth 2 (two) times daily.  12/26/16  Yes Annitta Needs, NP  gabapentin (NEURONTIN) 600 MG tablet Take 600 mg by mouth 4 (four) times daily as needed (for neuropathy).    Yes [provider]  hydrochlorothiazide (HYDRODIURIL) 25 MG tablet Take 25 mg by mouth daily.   Yes [provider]  insulin detemir (LEVEMIR) 100 UNIT/ML injection Inject 20 Units into the skin at bedtime as needed.    Yes [provider]  insulin NPH-regular Human (NOVOLIN 70/30) (70-30) 100 UNIT/ML injection Inject 35 Units into the skin 2 (two) times daily.   Yes [provider]  lisinopril (PRINIVIL,ZESTRIL) 40 MG tablet Take 40 mg by mouth daily.   Yes [provider]  LORazepam (ATIVAN) 0.5 MG tablet Take 0.5 mg by mouth at bedtime as needed for anxiety.   Yes [provider]  metoCLOPramide (REGLAN) 10 MG tablet Take 1 tablet (10 mg total) by mouth every 6 (six) hours as needed for nausea (nausea/headache). 02/21/17  Yes Milton Ferguson, MD  pantoprazole (PROTONIX) 40 MG tablet TAKE 1 TABLET BY MOUTH TWICE DAILY BEFORE  A  MEAL 03/23/17  Yes Mahala Menghini, PA-C  sitaGLIPtin (JANUVIA) 100 MG tablet Take 100 mg by mouth daily.   Yes [provider]  sucralfate (CARAFATE) 1 g tablet TAKE ONE TABLET BY MOUTH 4 TIMES DAILY 03/12/16  Yes Annitta Needs, NP  timolol (BETIMOL) 0.5 % ophthalmic solution Place 1  drop into both eyes 2 (two) times daily.   Yes [provider]    Family History Family History  Problem Relation Age of Onset  . Ovarian cancer Mother   . Cervical cancer Sister   . Diabetes Sister   . Colon cancer Neg Hx     Social History Social History  Substance Use Topics  . Smoking status: Never Smoker  . Smokeless tobacco: Never Used  . Alcohol use No     Allergies   Donnatal [pb-hyoscy-atropine-scopol er]; Fish allergy; Haldol [haloperidol lactate]; Lidocaine; Maalox [calcium carbonate antacid]; Aspirin; Bactrim [sulfamethoxazole-trimethoprim]; Doxycycline; Penicillins; and Phenergan [promethazine hcl]   Review of Systems Review of Systems  All other systems reviewed and are negative.    Physical Exam Updated Vital Signs BP (!) 170/93   Pulse (!) 101   Temp 99 F (37.2 C) (Oral)   Resp 18   Ht 6' (1.829  m)   Wt 136.1 kg (300 lb)   SpO2 99%   BMI 40.69 kg/m   Physical Exam  Constitutional: He is oriented to person, place, and time. He appears well-developed and well-nourished. No distress.  Obese  HENT:  Head: Normocephalic and atraumatic.  Right Ear: External ear normal.  Left Ear: External ear normal.  Eyes: Pupils are equal, round, and reactive to light. Conjunctivae and EOM are normal.  Neck: Normal range of motion and phonation normal. Neck supple.  Cardiovascular: Normal rate, regular rhythm and normal heart sounds.   Pulmonary/Chest: Effort normal and breath sounds normal. He exhibits no bony tenderness.  Abdominal: Soft. He exhibits no mass. There is tenderness (Epigastric, mild). There is no rebound and no guarding.  Musculoskeletal: Normal range of motion.  Neurological: He is alert and oriented to person, place, and time. No cranial nerve deficit or sensory deficit. He exhibits normal muscle tone. Coordination normal.  Skin: Skin is warm, dry and intact.  Psychiatric: He has a normal mood and affect. His behavior is normal.  Judgment and thought content normal.  Nursing note and vitals reviewed.    ED Treatments / Results  Labs (all labs ordered are listed, but only abnormal results are displayed) Labs Reviewed  COMPREHENSIVE METABOLIC PANEL - Abnormal; Notable for the following:       Result Value   Glucose, Bld 214 (*)    Creatinine, Ser 1.27 (*)    ALT 16 (*)    All other components within normal limits  CBC - Abnormal; Notable for the following:    RBC 3.79 (*)    Hemoglobin 11.8 (*)    HCT 33.1 (*)    Platelets 148 (*)    All other components within normal limits  URINALYSIS, ROUTINE W REFLEX MICROSCOPIC - Abnormal; Notable for the following:    Glucose, UA 50 (*)    Hgb urine dipstick SMALL (*)    Protein, ur >=300 (*)    All other components within normal limits  RAPID URINE DRUG SCREEN, HOSP PERFORMED - Abnormal; Notable for the following:    Benzodiazepines POSITIVE (*)    All other components within normal limits  LIPASE, BLOOD    EKG  EKG Interpretation None       Radiology No results found.  Procedures Procedures (including critical care time)  Medications Ordered in ED Medications  ondansetron (ZOFRAN-ODT) disintegrating tablet 4 mg (4 mg Oral Given by Other 04/16/17 1309)  metoCLOPramide (REGLAN) injection 10 mg (10 mg Intramuscular Given 04/16/17 1419)  oxyCODONE-acetaminophen (PERCOCET/ROXICET) 5-325 MG per tablet 1 tablet (1 tablet Oral Given 04/16/17 1606)  hydrOXYzine (VISTARIL) injection 50 mg (50 mg Intramuscular Given 04/16/17 1747)     Initial Impression / Assessment and Plan / ED Course  I have reviewed the triage vital signs and the nursing notes.  Pertinent labs & imaging results that were available during my care of the patient were reviewed by me and considered in my medical decision making (see chart for details).  Clinical Course as of Apr 17 1102  Thu Apr 16, 2017  2108 Glucose: (!) 214 [EW]    Clinical Course User Index [EW] Daleen Bo, MD      Patient Vitals for the past 24 hrs:  BP Temp Temp src Pulse Resp SpO2 Height Weight  04/16/17 2118 (!) 170/93 - - (!) 101 18 99 % - -  04/16/17 1900 (!) 131/101 - - 89 20 98 % - -  04/16/17 1650 (!) 162/104  99 F (37.2 C) Oral 95 (!) 22 100 % - -  04/16/17 1616 - - - 96 - 98 % - -  04/16/17 1608 - - - 96 - 98 % - -  04/16/17 1607 (!) 178/103 - - - - - - -  04/16/17 1332 (!) 176/103 - - 98 - 99 % - -  04/16/17 1303 (!) 158/95 99.4 F (37.4 C) Oral 91 18 96 % 6' (1.829 m) 136.1 kg (300 lb)    At discharge- reevaluation with update and discussion. After initial assessment and treatment, an updated evaluation reveals he is comfortable now, and feels somewhat better.  Findings discussed with patient and his family member, all questions answered. Acel Natzke L      Final Clinical Impressions(s) / ED Diagnoses   Final diagnoses:  Gastroparesis    Chronic recurrent nausea and vomiting related to gastroparesis.  Patient has recovered his baseline and stable.  Mild hyperglycemia, without ketosis.  Doubt metabolic instability, impending vascular collapse.  Nursing Notes Reviewed/ Care Coordinated Applicable Imaging Reviewed Interpretation of Laboratory Data incorporated into ED treatment  The patient appears reasonably screened and/or stabilized for discharge and I doubt any other medical condition or other [facility] requiring further screening, evaluation, or treatment in the ED at this time prior to discharge.  Plan: Home Medications-continue current medications; Home Treatments-rest, gradually advance diet; return here if the recommended treatment, does not improve the symptoms; Recommended follow up-PCP as needed   New Prescriptions Discharge Medication List as of 04/16/2017  9:13 PM       Daleen Bo, MD 04/17/17 1105

## 2017-04-21 DIAGNOSIS — I10 Essential (primary) hypertension: Secondary | ICD-10-CM | POA: Diagnosis not present

## 2017-04-21 DIAGNOSIS — E876 Hypokalemia: Secondary | ICD-10-CM | POA: Diagnosis not present

## 2017-04-21 DIAGNOSIS — L97519 Non-pressure chronic ulcer of other part of right foot with unspecified severity: Secondary | ICD-10-CM | POA: Diagnosis not present

## 2017-04-21 DIAGNOSIS — E11621 Type 2 diabetes mellitus with foot ulcer: Secondary | ICD-10-CM | POA: Diagnosis not present

## 2017-04-21 DIAGNOSIS — E1143 Type 2 diabetes mellitus with diabetic autonomic (poly)neuropathy: Secondary | ICD-10-CM | POA: Diagnosis not present

## 2017-04-21 DIAGNOSIS — E1169 Type 2 diabetes mellitus with other specified complication: Secondary | ICD-10-CM | POA: Diagnosis not present

## 2017-04-21 DIAGNOSIS — L97513 Non-pressure chronic ulcer of other part of right foot with necrosis of muscle: Secondary | ICD-10-CM | POA: Diagnosis not present

## 2017-04-21 DIAGNOSIS — M86671 Other chronic osteomyelitis, right ankle and foot: Secondary | ICD-10-CM | POA: Diagnosis not present

## 2017-04-21 DIAGNOSIS — E1139 Type 2 diabetes mellitus with other diabetic ophthalmic complication: Secondary | ICD-10-CM | POA: Diagnosis not present

## 2017-04-22 ENCOUNTER — Ambulatory Visit (INDEPENDENT_AMBULATORY_CARE_PROVIDER_SITE_OTHER): Payer: Medicare Other

## 2017-04-22 ENCOUNTER — Ambulatory Visit (INDEPENDENT_AMBULATORY_CARE_PROVIDER_SITE_OTHER): Payer: Medicare Other | Admitting: Podiatry

## 2017-04-22 DIAGNOSIS — L97512 Non-pressure chronic ulcer of other part of right foot with fat layer exposed: Secondary | ICD-10-CM | POA: Diagnosis not present

## 2017-04-22 DIAGNOSIS — M79671 Pain in right foot: Secondary | ICD-10-CM

## 2017-04-22 DIAGNOSIS — M86471 Chronic osteomyelitis with draining sinus, right ankle and foot: Secondary | ICD-10-CM | POA: Diagnosis not present

## 2017-04-22 DIAGNOSIS — I70235 Atherosclerosis of native arteries of right leg with ulceration of other part of foot: Secondary | ICD-10-CM | POA: Diagnosis not present

## 2017-04-22 DIAGNOSIS — E0842 Diabetes mellitus due to underlying condition with diabetic polyneuropathy: Secondary | ICD-10-CM

## 2017-04-22 NOTE — Patient Instructions (Signed)
Pre-Operative Instructions  Congratulations, you have decided to take an important step towards improving your quality of life.  You can be assured that the doctors and staff at [facility] will be with you every step of the way.  Here are some important things you should know:  1. Plan to be at the surgery center/hospital at least 1 (one) hour prior to your scheduled time, unless otherwise directed by the surgical center/hospital staff.  You must have a responsible adult accompany you, remain during the surgery and drive you home.  Make sure you have directions to the surgical center/hospital to ensure you arrive on time. 2. If you are having surgery at [facility] or [facility] hospitals, you will need a copy of your medical history and physical form from your family physician within one month prior to the date of surgery. We will give you a form for your primary physician to complete.  3. We make every effort to accommodate the date you request for surgery.  However, there are times where surgery dates or times have to be moved.  We will contact you as soon as possible if a change in schedule is required.   4. No aspirin/ibuprofen for one week before surgery.  If you are on aspirin, any non-steroidal anti-inflammatory medications (Mobic, Aleve, Ibuprofen) should not be taken seven (7) days prior to your surgery.  You make take Tylenol for pain prior to surgery.  5. Medications - If you are taking daily heart and blood pressure medications, seizure, reflux, allergy, asthma, anxiety, pain or diabetes medications, make sure you notify the surgery center/hospital before the day of surgery so they can tell you which medications you should take or avoid the day of surgery. 6. No food or drink after midnight the night before surgery unless directed otherwise by surgical center/hospital staff. 7. No alcoholic beverages 24-hours prior to surgery.  No smoking 24-hours prior or 24-hours after  surgery. 8. Wear loose pants or shorts. They should be loose enough to fit over bandages, boots, and casts. 9. Don't wear slip-on shoes. Sneakers are preferred. 10. Bring your boot with you to the surgery center/hospital.  Also bring crutches or a walker if your physician has prescribed it for you.  If you do not have this equipment, it will be provided for you after surgery. 11. If you have not been contacted by the surgery center/hospital by the day before your surgery, call to confirm the date and time of your surgery. 12. Leave-time from work may vary depending on the type of surgery you have.  Appropriate arrangements should be made prior to surgery with your employer. 13. Prescriptions will be provided immediately following surgery by your doctor.  Fill these as soon as possible after surgery and take the medication as directed. Pain medications will not be refilled on weekends and must be approved by the doctor. 14. Remove nail polish on the operative foot and avoid getting pedicures prior to surgery. 15. Wash the night before surgery.  The night before surgery wash the foot and leg well with water and the antibacterial soap provided. Be sure to pay special attention to beneath the toenails and in between the toes.  Wash for at least three (3) minutes. Rinse thoroughly with water and dry well with a towel.  Perform this wash unless told not to do so by your physician.  Enclosed: 1 Ice pack (please put in freezer the night before surgery)   1 Hibiclens skin cleaner     Pre-op instructions  If you have any questions regarding the instructions, please do not hesitate to call our office.  [place]: [address], [place], [place] [address] -- [phone]  [place]: [address] [place], [place] [address] -- [phone]  [place]: [address]  [place], [place] [address] -- [phone]  [place]: [address], [address], [place], [place] [address] -- [phone]  Website:  [email or web address] 

## 2017-04-22 NOTE — Progress Notes (Signed)
   Subjective:  46 year old male with a history of diabetes mellitus results to the office today for evaluation of a nonhealing ulceration to the right forefoot. The patient states that the ulceration is been present for approximately 5 months now and he is currently being treated at the [facility] wound care center in [place], [place] under the care of Dr. Zada Girt, MD. Patient is also seeing infectious disease under the care of [name], NP (Infectious Disease). Patient was seen on 04/06/2017 by infectious disease and referred [facility] for possible consideration of bone biopsy. He presents today for further treatment and evaluation   Objective/Physical Exam General: The patient is alert and oriented x3 in no acute distress.  Dermatology:  Wound #1 noted to the dorsal medial aspect of the first MPJ right foot measuring [phone] cm (LxWxD).   To the noted ulceration(s), there is no eschar. There is a moderate amount of slough, fibrin, and necrotic tissue noted. Granulation tissue and wound base is red. There is a minimal amount of serosanguineous drainage noted. There is no exposed bone muscle-tendon ligament or joint. There is no malodor. Periwound integrity is intact. Skin is warm, dry and supple bilateral lower extremities.  Vascular: Neurovascular status intact. No edema or erythema noted. Capillary refill within normal limits.  Neurological: Epicritic and protective threshold absent bilaterally.   Musculoskeletal Exam: Range of motion within normal limits to all pedal and ankle joints bilateral. Muscle strength 5/5 in all groups bilateral.   Assessment: #1 right forefoot ulceration secondary to diabetes mellitus #2 diabetes mellitus w/ peripheral neuropathy   Plan of Care:  #1 Patient was evaluated. #2 medically necessary excisional debridement including subcutaneous tissue was performed using a tissue nipper and a chisel blade. Excisional debridement of  all the necrotic nonviable tissue down to healthy bleeding viable tissue was performed with post-debridement measurements same as pre-. #3 the wound was cleansed and dry sterile dressing applied. #4 today discussed in detail the benefits of performing a bone biopsy to rule out possible osteomyelitis to the head of the first metatarsal underlying the ulceration. Patient states verbal understanding. All possible complications and details the procedure were explained. No guarantees were expressed or implied. All patient questions answered. #5 authorization for surgery initiated today. Surgery will consist of bone biopsy first metatarsal head right foot. Cheilectomy first metatarsal right foot.  #6 continue follow-up care with Dr. Nils Pyle at [facility] wound care center in [place]. #7 continue antibiotic treatment through infectious disease #8 return to clinic 1 week postop. I informed the patient that I will personally called Dr. Nils Pyle and discuss my plan of care for the patient  Edrick Kins, DPM [facility]  Dr. Edrick Kins, [place]                                        [place], [place] [address]                Office [phone]  Fax [phone]

## 2017-04-23 DIAGNOSIS — E1169 Type 2 diabetes mellitus with other specified complication: Secondary | ICD-10-CM | POA: Diagnosis not present

## 2017-04-23 DIAGNOSIS — M86671 Other chronic osteomyelitis, right ankle and foot: Secondary | ICD-10-CM | POA: Diagnosis not present

## 2017-04-23 DIAGNOSIS — L97513 Non-pressure chronic ulcer of other part of right foot with necrosis of muscle: Secondary | ICD-10-CM | POA: Diagnosis not present

## 2017-04-23 DIAGNOSIS — E11621 Type 2 diabetes mellitus with foot ulcer: Secondary | ICD-10-CM | POA: Diagnosis not present

## 2017-04-23 DIAGNOSIS — L97519 Non-pressure chronic ulcer of other part of right foot with unspecified severity: Secondary | ICD-10-CM | POA: Diagnosis not present

## 2017-04-27 ENCOUNTER — Ambulatory Visit (INDEPENDENT_AMBULATORY_CARE_PROVIDER_SITE_OTHER): Payer: Medicare Other | Admitting: Infectious Diseases

## 2017-04-27 VITALS — BP 137/88 | HR 82 | Temp 98.6°F | Wt 304.0 lb

## 2017-04-27 DIAGNOSIS — G6289 Other specified polyneuropathies: Secondary | ICD-10-CM | POA: Diagnosis not present

## 2017-04-27 DIAGNOSIS — F419 Anxiety disorder, unspecified: Secondary | ICD-10-CM | POA: Diagnosis not present

## 2017-04-27 DIAGNOSIS — L97414 Non-pressure chronic ulcer of right heel and midfoot with necrosis of bone: Secondary | ICD-10-CM

## 2017-04-27 DIAGNOSIS — K3184 Gastroparesis: Secondary | ICD-10-CM | POA: Diagnosis not present

## 2017-04-27 DIAGNOSIS — E785 Hyperlipidemia, unspecified: Secondary | ICD-10-CM | POA: Diagnosis not present

## 2017-04-27 DIAGNOSIS — M869 Osteomyelitis, unspecified: Secondary | ICD-10-CM | POA: Diagnosis not present

## 2017-04-27 DIAGNOSIS — E782 Mixed hyperlipidemia: Secondary | ICD-10-CM | POA: Diagnosis not present

## 2017-04-27 DIAGNOSIS — I10 Essential (primary) hypertension: Secondary | ICD-10-CM | POA: Diagnosis not present

## 2017-04-27 DIAGNOSIS — E1143 Type 2 diabetes mellitus with diabetic autonomic (poly)neuropathy: Secondary | ICD-10-CM | POA: Diagnosis present

## 2017-04-27 NOTE — Progress Notes (Signed)
Patient Name: Timothy Clark  MRN: [id number]  DOB: Dec 13, 1970    Reason for Visit: Osteomyelitis   Referring Provider: Zada Girt ([place])  INFECTIOUS DISEASE OFFICE FOLLOW UP SUBJECTIVE:  HPI: Timothy Clark is a 46 y.o. male diabetic patient who is here today for follow up on management of osteomyelitis of his right forefoot/#1MTP.  He has had a chronic ulcer involving the superior right hallux since at least March 2018; previously with ulcer to ball of his foot about a year ago that is now healed. He has been on IV antibiotics  for about 10-14 days earlier this year in 2018 when he was hospitalized at [facility]). Recent Hgb A1c per office records was 9.1%. Blood flow in the legs normal with previous ABIs. He continues to be ambulatory, although spends a fair amount of time sitting. Currently undergoing hyperbaric oxygen treatments for wound 2-3x a week.   Interval history includes one trip to [facility] ER for intractable vomiting with his history of gastroporesis. Otherwise doing well. Only medication change includes switching HTN therapy - PCP stopped his amlodipine and started new medication to help with swelling of LEs, which he does report to be improved. Today he endorses no concerning signs or symptoms for systemic involvement such as fevers, malaise, chills, decreased appetite or altered mental status. Reports his fasting blood sugars in 130s. Nervous about having PICC line as he does not have the family support to assist with outpatient management. Previously in care with [facility] in [place] where they made an exception for daily home health visits for infusion therapy. Met with Dr. Amalia Hailey this week - will have bone biopsy and debridement this Thursday.    Patient's Medications  New Prescriptions   No medications on file  Previous Medications   AMLODIPINE (NORVASC) 5 MG TABLET    Take 5 mg by mouth daily.   ATORVASTATIN  (LIPITOR) 10 MG TABLET    Take 10 mg by mouth daily.   BRIMONIDINE (ALPHAGAN) 0.15 % OPHTHALMIC SOLUTION    Place 1 drop into both eyes 2 (two) times daily.    CETIRIZINE (ZYRTEC) 10 MG TABLET    Take 10 mg by mouth daily.   CYCLOBENZAPRINE (FLEXERIL) 10 MG TABLET    Take 10 mg by mouth at bedtime as needed for muscle spasms.    DICYCLOMINE (BENTYL) 10 MG CAPSULE    Take 1 capsule (10 mg total) by mouth 4 (four) times daily -  before meals and at bedtime.   GABAPENTIN (NEURONTIN) 600 MG TABLET    Take 600 mg by mouth 4 (four) times daily as needed (for neuropathy).    HYDROCHLOROTHIAZIDE (HYDRODIURIL) 25 MG TABLET    Take 25 mg by mouth daily.   INSULIN DETEMIR (LEVEMIR) 100 UNIT/ML INJECTION    Inject 20 Units into the skin at bedtime as needed.    INSULIN NPH-REGULAR HUMAN (NOVOLIN 70/30) (70-30) 100 UNIT/ML INJECTION    Inject 35 Units into the skin 2 (two) times daily.   LISINOPRIL (PRINIVIL,ZESTRIL) 40 MG TABLET    Take 40 mg by mouth daily.   LORAZEPAM (ATIVAN) 0.5 MG TABLET    Take 0.5 mg by mouth at bedtime as needed for anxiety.   METOCLOPRAMIDE (REGLAN) 10 MG TABLET    Take 1 tablet (10 mg total) by mouth every 6 (six) hours as needed for nausea (nausea/headache).   PANTOPRAZOLE (PROTONIX) 40 MG TABLET    TAKE 1 TABLET  BY MOUTH TWICE DAILY BEFORE  A  MEAL   SITAGLIPTIN (JANUVIA) 100 MG TABLET    Take 100 mg by mouth daily.   SUCRALFATE (CARAFATE) 1 G TABLET    TAKE ONE TABLET BY MOUTH 4 TIMES DAILY   TIMOLOL (BETIMOL) 0.5 % OPHTHALMIC SOLUTION    Place 1 drop into both eyes 2 (two) times daily.  Modified Medications   No medications on file  Discontinued Medications   No medications on file    Review of Systems  Constitutional: Negative for chills, fever, malaise/fatigue and weight loss.  HENT: Negative for sore throat.   Respiratory: Negative for cough and sputum production.   Cardiovascular: Positive for leg swelling. Negative for chest pain.  Gastrointestinal: Negative for  abdominal pain, diarrhea and vomiting.  Genitourinary: Negative for dysuria and flank pain.  Musculoskeletal: Negative for joint pain and myalgias.       Ulcer to right foot hallux   Skin: Positive for rash (flaking rash to shins).  Neurological: Negative for dizziness, tingling and headaches.  Endo/Heme/Allergies: Positive for polydipsia.    Past Medical History:  Diagnosis Date  . Chronic abdominal pain   . Esophagitis   . Eye hemorrhage   . Gastritis   . Gastroparesis   . GERD (gastroesophageal reflux disease)   . Glaucoma   . Helicobacter pylori (H. pylori) infection   . Hiccups   . Hypertension   . Nausea and vomiting    chronic, recurrent  . Type 2 diabetes mellitus with complications (HCC)    diagnosed around age 56    Allergies  Allergen Reactions  . Donnatal [Pb-Hyoscy-Atropine-Scopol Er] Anaphylaxis    Reaction to GI cocktail  . Fish Allergy Anaphylaxis, Hives and Rash  . Haldol [Haloperidol Lactate] Other (See Comments)    Chest Pain  . Lidocaine Anaphylaxis    Reaction to GI cocktail  . Maalox [Calcium Carbonate Antacid] Anaphylaxis    Reaction to GI cocktail  . Aspirin Hives and Itching  . Bactrim [Sulfamethoxazole-Trimethoprim] Itching  . Doxycycline Hives and Itching  . Penicillins Hives and Itching    Has patient had a PCN reaction causing immediate rash, facial/tongue/throat swelling, SOB or lightheadedness with hypotension: yes Has patient had a PCN reaction causing severe rash involving mucus membranes or skin necrosis: yes Has patient had a PCN reaction that required hospitalization no Has patient had a PCN reaction occurring within the last 10 years: yes If all of the above answers are "NO", then may proceed with Cephalospor  . Phenergan [Promethazine Hcl] Nausea And Vomiting    OBJECTIVE: Vitals:   04/27/17 1419  BP: 137/88  Pulse: 82  Temp: 98.6 F (37 C)  TempSrc: Oral  Weight: (!) 304 lb (137.9 kg)   Body mass index is 41.23  kg/m.  Physical Exam  Constitutional: He is oriented to person, place, and time and well-developed, well-nourished, and in no distress.  HENT:  Mouth/Throat: Oropharynx is clear and moist.  Eyes:  Right eye with outward gaze (chronic)  Cardiovascular: Normal rate, normal heart sounds and intact distal pulses.   No murmur heard. Pulmonary/Chest: Effort normal and breath sounds normal.  Abdominal: Soft. Bowel sounds are normal. He exhibits no distension.  Musculoskeletal:  2+ edema through knees bilaterally. Does have scaling rash with small nodules on anterior shins.   Foot wound covered with small gauze today - callous removed with evaluation by Dr. Amalia Hailey with serous/browh drainage to site. No pain. No purulence.   Neurological: He is alert  and oriented to person, place, and time.  Skin: Skin is warm and dry.  Warm and well perfused. Decreased distal sensation to bilateral feet on underside of foot. Indistinguishable sharp/dull pain. Thickened black toenails and skin.   Psychiatric: Affect normal.    LABS & DIAGNOSTICS:  MRI 02/25/17 Right Forefoot W/O Contrast: Soft tissue ulceration involving the medial aspect of forefoot at the level of the first MTP joint. There is underlying osteomyelitis involving the first metatarsal head. I do not see any definite findings for septic arthritis. Moderate degenerative changes noted. Surrounding cellulitis w/o myofasciitis w/o drainable soft tissue abscess or pyomyositis. The other bony structures appear intact. The medial and lateral sesamoids of the great toe are intact.  ASSESSMENT & PLAN:  Problem List Items Addressed This Visit      Digestive   Gastroparesis due to DM [facility]) - Primary    Seen in ER recently for this. Controlled currently however concerned about PO options for treatment of his condition.        Musculoskeletal and Integument   DIABETIC FOOT ULCER    Underlying osteomyelitis of #1MTP. Still without systemic signs of  illness. Scheduled for bone biopsy with Dr. Amalia Hailey and debridement of long-standing ulcer. I would ultimately like to have PICC placed and treat patient with prolonged IV antibiotics however he is very concerned about lack of continuous support at home to facilitate this option. May need to admit and send to SNF for duration of therapy. May be potential for oral regimen however he has significant gastroparesis and several allergies to different antibiotics and I worry about tolerability and viability of this option. PCN allergy - seems he has tolerated 1st gen cephalosporin (keflex) in the past  - may have caused GI upset but unsure if it was this medication or the ciprofloxacin he was prescribed but no hives. Was unable to complete therapy d/t vomiting.   Will FU next week on culture results and call patient to discuss further plans.          [name], MSN, [facility]  04/27/2017  3:08 PM

## 2017-04-27 NOTE — Assessment & Plan Note (Addendum)
Seen in ER recently for this. Controlled currently however concerned about PO options for treatment of his condition.

## 2017-04-27 NOTE — Assessment & Plan Note (Signed)
Underlying osteomyelitis of #1MTP. Still without systemic signs of illness. Scheduled for bone biopsy with Dr. Amalia Hailey and debridement of long-standing ulcer. I would ultimately like to have PICC placed and treat patient with prolonged IV antibiotics however he is very concerned about lack of continuous support at home to facilitate this option. May need to admit and send to SNF for duration of therapy. May be potential for oral regimen however he has significant gastroparesis and several allergies to different antibiotics and I worry about tolerability and viability of this option. PCN allergy - seems he has tolerated 1st gen cephalosporin (keflex) in the past  - may have caused GI upset but unsure if it was this medication or the ciprofloxacin he was prescribed but no hives. Was unable to complete therapy d/t vomiting.   Will FU next week on culture results and call patient to discuss further plans.

## 2017-04-27 NOTE — Patient Instructions (Signed)
Will call you at the end of next week once we get information back about the bone cultures so we can discuss the plan we need to take for your treatment.   Please call and report any fevers or chills should you develop these while we are planning your treatment.

## 2017-04-28 DIAGNOSIS — E11621 Type 2 diabetes mellitus with foot ulcer: Secondary | ICD-10-CM | POA: Diagnosis not present

## 2017-04-28 DIAGNOSIS — L97513 Non-pressure chronic ulcer of other part of right foot with necrosis of muscle: Secondary | ICD-10-CM | POA: Diagnosis not present

## 2017-04-28 DIAGNOSIS — L97519 Non-pressure chronic ulcer of other part of right foot with unspecified severity: Secondary | ICD-10-CM | POA: Diagnosis not present

## 2017-04-28 DIAGNOSIS — E1169 Type 2 diabetes mellitus with other specified complication: Secondary | ICD-10-CM | POA: Diagnosis not present

## 2017-04-28 DIAGNOSIS — M86671 Other chronic osteomyelitis, right ankle and foot: Secondary | ICD-10-CM | POA: Diagnosis not present

## 2017-04-29 DIAGNOSIS — E11621 Type 2 diabetes mellitus with foot ulcer: Secondary | ICD-10-CM | POA: Diagnosis not present

## 2017-04-29 DIAGNOSIS — M86671 Other chronic osteomyelitis, right ankle and foot: Secondary | ICD-10-CM | POA: Diagnosis not present

## 2017-04-29 DIAGNOSIS — E1169 Type 2 diabetes mellitus with other specified complication: Secondary | ICD-10-CM | POA: Diagnosis not present

## 2017-04-29 DIAGNOSIS — L97513 Non-pressure chronic ulcer of other part of right foot with necrosis of muscle: Secondary | ICD-10-CM | POA: Diagnosis not present

## 2017-04-29 DIAGNOSIS — L97519 Non-pressure chronic ulcer of other part of right foot with unspecified severity: Secondary | ICD-10-CM | POA: Diagnosis not present

## 2017-04-30 ENCOUNTER — Encounter: Payer: Self-pay | Admitting: Podiatry

## 2017-04-30 DIAGNOSIS — M19071 Primary osteoarthritis, right ankle and foot: Secondary | ICD-10-CM | POA: Diagnosis not present

## 2017-04-30 DIAGNOSIS — E7800 Pure hypercholesterolemia, unspecified: Secondary | ICD-10-CM | POA: Diagnosis not present

## 2017-04-30 DIAGNOSIS — E0842 Diabetes mellitus due to underlying condition with diabetic polyneuropathy: Secondary | ICD-10-CM | POA: Diagnosis not present

## 2017-04-30 DIAGNOSIS — Z0189 Encounter for other specified special examinations: Secondary | ICD-10-CM | POA: Diagnosis not present

## 2017-04-30 DIAGNOSIS — M898X7 Other specified disorders of bone, ankle and foot: Secondary | ICD-10-CM | POA: Diagnosis not present

## 2017-04-30 DIAGNOSIS — M25774 Osteophyte, right foot: Secondary | ICD-10-CM | POA: Diagnosis not present

## 2017-04-30 DIAGNOSIS — L97512 Non-pressure chronic ulcer of other part of right foot with fat layer exposed: Secondary | ICD-10-CM | POA: Diagnosis not present

## 2017-04-30 DIAGNOSIS — M86171 Other acute osteomyelitis, right ankle and foot: Secondary | ICD-10-CM | POA: Diagnosis not present

## 2017-04-30 DIAGNOSIS — M2021 Hallux rigidus, right foot: Secondary | ICD-10-CM | POA: Diagnosis not present

## 2017-05-04 DIAGNOSIS — E113591 Type 2 diabetes mellitus with proliferative diabetic retinopathy without macular edema, right eye: Secondary | ICD-10-CM | POA: Diagnosis not present

## 2017-05-05 DIAGNOSIS — M86671 Other chronic osteomyelitis, right ankle and foot: Secondary | ICD-10-CM | POA: Diagnosis not present

## 2017-05-05 DIAGNOSIS — E11621 Type 2 diabetes mellitus with foot ulcer: Secondary | ICD-10-CM | POA: Diagnosis not present

## 2017-05-05 DIAGNOSIS — L97513 Non-pressure chronic ulcer of other part of right foot with necrosis of muscle: Secondary | ICD-10-CM | POA: Diagnosis not present

## 2017-05-05 DIAGNOSIS — L97519 Non-pressure chronic ulcer of other part of right foot with unspecified severity: Secondary | ICD-10-CM | POA: Diagnosis not present

## 2017-05-05 DIAGNOSIS — E1169 Type 2 diabetes mellitus with other specified complication: Secondary | ICD-10-CM | POA: Diagnosis not present

## 2017-05-06 ENCOUNTER — Ambulatory Visit (INDEPENDENT_AMBULATORY_CARE_PROVIDER_SITE_OTHER): Payer: Medicare Other | Admitting: Podiatry

## 2017-05-06 ENCOUNTER — Encounter: Payer: Self-pay | Admitting: Podiatry

## 2017-05-06 ENCOUNTER — Ambulatory Visit (INDEPENDENT_AMBULATORY_CARE_PROVIDER_SITE_OTHER): Payer: Medicare Other

## 2017-05-06 VITALS — Temp 99.2°F

## 2017-05-06 DIAGNOSIS — M2021 Hallux rigidus, right foot: Secondary | ICD-10-CM

## 2017-05-06 DIAGNOSIS — Z9889 Other specified postprocedural states: Secondary | ICD-10-CM

## 2017-05-06 MED ORDER — DELAFLOXACIN MEGLUMINE 450 MG PO TABS: 1.0000 | ORAL_TABLET | Freq: Two times a day (BID) | ORAL | 0 refills | Status: DC

## 2017-05-06 NOTE — Progress Notes (Signed)
   Subjective:  Patient presents today status post exostectomy with bone biopsy first metatarsal right foot. DOS: 04/30/2017. Patient has a history of recurrent ulceration overlying the first MTPJ right foot. Infectious disease requested bone biopsy. Cheilectomy bone biopsy was performed to the first MPJ right foot. Bone biopsy results are still pending. He presents today for further treatment and evaluation   Past Medical History:  Diagnosis Date  . Chronic abdominal pain   . Esophagitis   . Eye hemorrhage   . Gastritis   . Gastroparesis   . GERD (gastroesophageal reflux disease)   . Glaucoma   . Helicobacter pylori (H. pylori) infection   . Hiccups   . Hypertension   . Nausea and vomiting    chronic, recurrent  . Type 2 diabetes mellitus with complications (HCC)    diagnosed around age 20      Objective/Physical Exam Skin incisions appear to be well coapted with sutures and staples intact.There is a small central portion of the incision site does have some maceration with some drainage noted. No sign of infectious process noted. No dehiscence. No active bleeding noted. Moderate edema noted to the surgical extremity. There does appear to be some localized cellulitis surrounding the incision site. The redness with erythema does not extend proximal past the incision site however. Incisional area appears to contain more rubor than usual.  Radiographic Exam:  Osteotomies sites appear to be stable with routine healing.  Assessment: 1. s/p bone biopsy was exostectomy first metatarsal right foot. DOS: 04/30/2017   Plan of Care:  1. Patient was evaluated. X-rays reviewed 2. Today dressings were changed. Continue weightbearing in a postoperative shoe. 3. Prescription for Baxdela placed to address the localized cellulitis 4. Send a copy of the pathology report to [name], NP (infectious disease) 5. Return to clinic in 1 week   Edrick Kins, DPM [facility]  Dr. Edrick Kins, [address]                                        [place], [place] [address]                Office [phone]  Fax [phone]

## 2017-05-07 DIAGNOSIS — E11621 Type 2 diabetes mellitus with foot ulcer: Secondary | ICD-10-CM | POA: Diagnosis not present

## 2017-05-07 DIAGNOSIS — E1169 Type 2 diabetes mellitus with other specified complication: Secondary | ICD-10-CM | POA: Diagnosis not present

## 2017-05-07 DIAGNOSIS — L97519 Non-pressure chronic ulcer of other part of right foot with unspecified severity: Secondary | ICD-10-CM | POA: Diagnosis not present

## 2017-05-07 DIAGNOSIS — L97513 Non-pressure chronic ulcer of other part of right foot with necrosis of muscle: Secondary | ICD-10-CM | POA: Diagnosis not present

## 2017-05-07 DIAGNOSIS — M86671 Other chronic osteomyelitis, right ankle and foot: Secondary | ICD-10-CM | POA: Diagnosis not present

## 2017-05-13 ENCOUNTER — Ambulatory Visit (INDEPENDENT_AMBULATORY_CARE_PROVIDER_SITE_OTHER): Payer: Medicare Other | Admitting: Podiatry

## 2017-05-13 ENCOUNTER — Telehealth: Payer: Self-pay | Admitting: *Deleted

## 2017-05-13 ENCOUNTER — Encounter: Payer: Self-pay | Admitting: Podiatry

## 2017-05-13 VITALS — Temp 97.9°F

## 2017-05-13 DIAGNOSIS — E0842 Diabetes mellitus due to underlying condition with diabetic polyneuropathy: Secondary | ICD-10-CM

## 2017-05-13 DIAGNOSIS — I70235 Atherosclerosis of native arteries of right leg with ulceration of other part of foot: Secondary | ICD-10-CM | POA: Diagnosis not present

## 2017-05-13 DIAGNOSIS — L97512 Non-pressure chronic ulcer of other part of right foot with fat layer exposed: Secondary | ICD-10-CM

## 2017-05-13 DIAGNOSIS — Z9889 Other specified postprocedural states: Secondary | ICD-10-CM

## 2017-05-13 NOTE — Telephone Encounter (Addendum)
-----   Message from Edrick Kins, DPM sent at 05/13/2017 11:38 AM EDT ----- Please order home health dressing changes right foot ulcer. Patient lives in [place]. He recommended [facility] who he has used in the past.   Diagnosis: Right foot diabetic ulcer. Surgical dehiscence.   3x/week x 8 weeks - Cleansed with normal saline. Dry. - Applied Betadine periwound - Dress with Aquacel Ag and dry sterile dressing  Thanks, Dr. Amalia Hailey. Faxed required form, clinicals and demographics to [place] [facility] [place].

## 2017-05-14 ENCOUNTER — Telehealth: Payer: Self-pay | Admitting: *Deleted

## 2017-05-14 DIAGNOSIS — K3184 Gastroparesis: Secondary | ICD-10-CM | POA: Diagnosis not present

## 2017-05-14 DIAGNOSIS — E11621 Type 2 diabetes mellitus with foot ulcer: Secondary | ICD-10-CM | POA: Diagnosis not present

## 2017-05-14 DIAGNOSIS — L97513 Non-pressure chronic ulcer of other part of right foot with necrosis of muscle: Secondary | ICD-10-CM | POA: Diagnosis not present

## 2017-05-14 DIAGNOSIS — E1143 Type 2 diabetes mellitus with diabetic autonomic (poly)neuropathy: Secondary | ICD-10-CM | POA: Diagnosis not present

## 2017-05-14 NOTE — Telephone Encounter (Addendum)
-----   Message from Edrick Kins, DPM sent at 05/13/2017 11:38 AM EDT ----- Please order home health dressing changes right foot ulcer. Patient lives in [place]. He recommended [facility] who he has used in the past.   Diagnosis: Right foot diabetic ulcer. Surgical dehiscence.   3x/week x 8 weeks - Cleansed with normal saline. Dry. - Applied Betadine periwound - Dress with Aquacel Ag and dry sterile dressing  Thanks, Dr. Amalia Hailey. Faxed required form, clinicals and demographics to [place] [facility] [place].

## 2017-05-15 ENCOUNTER — Telehealth: Payer: Self-pay | Admitting: Podiatry

## 2017-05-15 NOTE — Telephone Encounter (Signed)
This is Timothy Clark, nurse and Publishing copy with [facility]. We received orders to go out to see him for wound care for three times a week for his foot. He informed us he goes to the wound center three times a week for the hyperbaric treatment. I was calling to see if you needed anything further you needed [place] to do, but we will not be admitting him since he is already getting that treated. If you have any questions, give [place] a call back at [phone]. Thank you so much.

## 2017-05-16 NOTE — Progress Notes (Signed)
   Subjective:  Patient presents today status post exostectomy with bone biopsy first metatarsal right foot. DOS: 04/30/2017. Patient has a history of recurrent ulceration overlying the first MTPJ right foot. Patient reports associated drainage from the incision site. He states he was in the hyperbaric chamber yesterday, 05/12/17. He presents today for further treatment and evaluation.   Past Medical History:  Diagnosis Date  . Chronic abdominal pain   . Esophagitis   . Eye hemorrhage   . Gastritis   . Gastroparesis   . GERD (gastroesophageal reflux disease)   . Glaucoma   . Helicobacter pylori (H. pylori) infection   . Hiccups   . Hypertension   . Nausea and vomiting    chronic, recurrent  . Type 2 diabetes mellitus with complications (HCC)    diagnosed around age 51      Objective/Physical Exam Skin incisions appear to be well coapted with sutures and staples intact. There is a small central portion of the incision site does have some maceration with some drainage noted. No sign of infectious process noted. No dehiscence. No active bleeding noted. Moderate edema noted to the surgical extremity. There does appear to be some localized cellulitis surrounding the incision site. The redness with erythema does not extend proximal past the incision site however. Incisional area appears to contain more rubor than usual.   Assessment: 1. s/p bone biopsy was exostectomy first metatarsal right foot. DOS: 04/30/2017 2. Ulcer right foot secondary to DM and postoperatively.   Plan of Care:  1. Patient was evaluated. 2. medically necessary excisional debridement including muscle and deep fascial tissue was performed using a tissue nipper and a chisel blade. Excisional debridement of all the necrotic nonviable tissue down to healthy bleeding viable tissue was performed with post-debridement measurements same as pre-. 3. The wound was cleansed and dry sterile dressing applied. 4. Begin taking  Baxdela as soon as it is delivered-supposed to be today. 5. Orders to initiate home health care dressing changes.  6. Return to clinic in 1 week.    Edrick Kins, DPM [facility]  Dr. Edrick Kins, [address]                                        [place], [place] [address]                Office [phone]  Fax [phone]

## 2017-05-19 NOTE — Progress Notes (Signed)
DOS 04/30/17 bone biopsy 1st met Rt foot, Cheilectomy Rt foot

## 2017-05-20 ENCOUNTER — Ambulatory Visit (INDEPENDENT_AMBULATORY_CARE_PROVIDER_SITE_OTHER): Payer: Medicare Other | Admitting: Podiatry

## 2017-05-20 ENCOUNTER — Ambulatory Visit: Payer: Medicare Other

## 2017-05-20 ENCOUNTER — Encounter: Payer: Self-pay | Admitting: Podiatry

## 2017-05-20 VITALS — BP 156/93 | HR 67 | Temp 97.2°F

## 2017-05-20 DIAGNOSIS — I70235 Atherosclerosis of native arteries of right leg with ulceration of other part of foot: Secondary | ICD-10-CM

## 2017-05-20 DIAGNOSIS — E0842 Diabetes mellitus due to underlying condition with diabetic polyneuropathy: Secondary | ICD-10-CM | POA: Diagnosis not present

## 2017-05-20 DIAGNOSIS — L97512 Non-pressure chronic ulcer of other part of right foot with fat layer exposed: Secondary | ICD-10-CM

## 2017-05-25 ENCOUNTER — Encounter: Payer: Self-pay | Admitting: Infectious Diseases

## 2017-05-25 ENCOUNTER — Ambulatory Visit (INDEPENDENT_AMBULATORY_CARE_PROVIDER_SITE_OTHER): Payer: Medicare Other | Admitting: Infectious Diseases

## 2017-05-25 DIAGNOSIS — L97414 Non-pressure chronic ulcer of right heel and midfoot with necrosis of bone: Secondary | ICD-10-CM | POA: Diagnosis present

## 2017-05-25 DIAGNOSIS — IMO0002 Reserved for concepts with insufficient information to code with codable children: Secondary | ICD-10-CM

## 2017-05-25 DIAGNOSIS — E118 Type 2 diabetes mellitus with unspecified complications: Secondary | ICD-10-CM | POA: Diagnosis not present

## 2017-05-25 DIAGNOSIS — E1165 Type 2 diabetes mellitus with hyperglycemia: Secondary | ICD-10-CM | POA: Diagnosis not present

## 2017-05-25 MED ORDER — DOXYCYCLINE HYCLATE 100 MG PO TABS: 100.0000 mg | ORAL_TABLET | Freq: Two times a day (BID) | ORAL | 1 refills | Status: DC

## 2017-05-25 MED ORDER — CIPROFLOXACIN HCL 500 MG PO TABS: 500.0000 mg | ORAL_TABLET | Freq: Two times a day (BID) | ORAL | 1 refills | Status: DC

## 2017-05-25 NOTE — Progress Notes (Signed)
Patient Name: Timothy Clark  MRN: [id number]  DOB: May 12, 1971    Reason for Visit: Osteomyelitis   Referring Provider: Zada Girt ([place])  INFECTIOUS DISEASE OFFICE FOLLOW UP SUBJECTIVE:  HPI: Timothy Clark is a 46 y.o. male diabetic patient who is here for follow up on osteomyelitis of his right forefoot/#[id number].  He has had a chronic ulcer involving the superior right hallux since approx March 2018; previously with ulcer to ball of his foot about a year ago that is now healed. He has in the past been on IV antibiotics ~10-14 days duration earlier 2018 at [facility]). Recent Hgb A1c per office records was 9.1%. Blood flow in the legs normal with previous ABIs. He continues to be ambulatory, although spends a fair amount of time sitting. Undergoing hyperbaric oxygen treatments for wound 2-3x a week.   S/P bone biopsy 04/30/17 - resolving chronic osteomyelitis, no culture performed. On [facility] per podiatry - reports it is very expensive. Was in to see Dr. Amalia Hailey recently again where he performed excisional debridement of necrotic tissue. He has his foot wrapped and in orthopedic shoe. He endorses no concerning signs or symptoms for systemic involvement such as fevers, malaise, chills, decreased appetite or altered mental status. Has been doing hyperbaric treatments 3x a week as best as he can attend. Has been close to 2 months of therapy. Blood sugars have been well controlled and actually tells me they were a little low today at 101. He has many concerns regarding regarding home infusion therapy as he does not have the support to do so and unable to drive himself (previous CVA with field cuts). He has several allergies/intolerances to antibiotics. Reports that he believes he can take doxycycline as he has tolerated it recently. Gastroparesis has been under control.   Patient's Medications  New Prescriptions   CIPROFLOXACIN (CIPRO) 500 MG  TABLET    Take 1 tablet (500 mg total) by mouth 2 (two) times daily.   DOXYCYCLINE (VIBRA-TABS) 100 MG TABLET    Take 1 tablet (100 mg total) by mouth 2 (two) times daily.  Previous Medications   AMLODIPINE (NORVASC) 5 MG TABLET    Take 5 mg by mouth daily.   ATORVASTATIN (LIPITOR) 10 MG TABLET    Take 10 mg by mouth daily.   BRIMONIDINE (ALPHAGAN) 0.15 % OPHTHALMIC SOLUTION    Place 1 drop into both eyes 2 (two) times daily.    CETIRIZINE (ZYRTEC) 10 MG TABLET    Take 10 mg by mouth daily.   CYCLOBENZAPRINE (FLEXERIL) 10 MG TABLET    Take 10 mg by mouth at bedtime as needed for muscle spasms.    DELAFLOXACIN MEGLUMINE (BAXDELA) 450 MG TABS    Take 1 tablet by mouth 2 (two) times daily.   DICYCLOMINE (BENTYL) 10 MG CAPSULE    Take 1 capsule (10 mg total) by mouth 4 (four) times daily -  before meals and at bedtime.   GABAPENTIN (NEURONTIN) 600 MG TABLET    Take 600 mg by mouth 4 (four) times daily as needed (for neuropathy).    HYDROCHLOROTHIAZIDE (HYDRODIURIL) 25 MG TABLET    Take 25 mg by mouth daily.   INSULIN DETEMIR (LEVEMIR) 100 UNIT/ML INJECTION    Inject 20 Units into the skin at bedtime as needed.    INSULIN NPH-REGULAR HUMAN (NOVOLIN 70/30) (70-30) 100 UNIT/ML INJECTION    Inject 35 Units into the skin 2 (two) times daily.   LISINOPRIL (  PRINIVIL,ZESTRIL) 40 MG TABLET    Take 40 mg by mouth daily.   LORAZEPAM (ATIVAN) 0.5 MG TABLET    Take 0.5 mg by mouth at bedtime as needed for anxiety.   METOCLOPRAMIDE (REGLAN) 10 MG TABLET    Take 1 tablet (10 mg total) by mouth every 6 (six) hours as needed for nausea (nausea/headache).   PANTOPRAZOLE (PROTONIX) 40 MG TABLET    TAKE 1 TABLET BY MOUTH TWICE DAILY BEFORE  A  MEAL   SITAGLIPTIN (JANUVIA) 100 MG TABLET    Take 100 mg by mouth daily.   SUCRALFATE (CARAFATE) 1 G TABLET    TAKE ONE TABLET BY MOUTH 4 TIMES DAILY   TIMOLOL (BETIMOL) 0.5 % OPHTHALMIC SOLUTION    Place 1 drop into both eyes 2 (two) times daily.  Modified Medications   No  medications on file  Discontinued Medications   No medications on file    Review of Systems  Constitutional: Negative for chills and fever.  Respiratory: Negative for cough and sputum production.   Cardiovascular: Positive for leg swelling. Negative for chest pain.  Gastrointestinal: Negative for abdominal pain, diarrhea and vomiting.  Genitourinary: Negative for dysuria and flank pain.  Musculoskeletal: Negative for joint pain and myalgias.  Skin: Negative for rash.  Neurological: Negative for dizziness, tingling and headaches.  Endo/Heme/Allergies: Negative for polydipsia.    Past Medical History:  Diagnosis Date  . Chronic abdominal pain   . Esophagitis   . Eye hemorrhage   . Gastritis   . Gastroparesis   . GERD (gastroesophageal reflux disease)   . Glaucoma   . Helicobacter pylori (H. pylori) infection   . Hiccups   . Hypertension   . Nausea and vomiting    chronic, recurrent  . Type 2 diabetes mellitus with complications (HCC)    diagnosed around age 53    Allergies  Allergen Reactions  . Donnatal [Pb-Hyoscy-Atropine-Scopol Er] Anaphylaxis    Reaction to GI cocktail  . Fish Allergy Anaphylaxis, Hives and Rash  . Haldol [Haloperidol Lactate] Other (See Comments)    Chest Pain  . Lidocaine Anaphylaxis    Reaction to GI cocktail  . Maalox [Calcium Carbonate Antacid] Anaphylaxis    Reaction to GI cocktail  . Aspirin Hives and Itching  . Bactrim [Sulfamethoxazole-Trimethoprim] Itching  . Penicillins Hives and Itching    Has patient had a PCN reaction causing immediate rash, facial/tongue/throat swelling, SOB or lightheadedness with hypotension: yes Has patient had a PCN reaction causing severe rash involving mucus membranes or skin necrosis: yes Has patient had a PCN reaction that required hospitalization no Has patient had a PCN reaction occurring within the last 10 years: yes If all of the above answers are "NO", then may proceed with Cephalospor  . Phenergan  [Promethazine Hcl] Nausea And Vomiting    OBJECTIVE: Vitals:   05/25/17 1429  BP: 133/81  Pulse: 76  Temp: 98.6 F (37 C)  TempSrc: Oral  Weight: (!) 304 lb (137.9 kg)   Body mass index is 41.23 kg/m.  Physical Exam  Constitutional: He is oriented to person, place, and time and well-developed, well-nourished, and in no distress.  HENT:  Mouth/Throat: Oropharynx is clear and moist.  Eyes:  Right eye with outward gaze (chronic)  Cardiovascular: Normal rate, normal heart sounds and intact distal pulses.   No murmur heard. Pulmonary/Chest: Effort normal and breath sounds normal.  Abdominal: Soft. Bowel sounds are normal. He exhibits no distension.  Musculoskeletal:  Foot wound covered with gauze  and ortho shoe in place. Requesting to not remove dressing as he is going to wound clinic today.   Neurological: He is alert and oriented to person, place, and time.  Skin: Skin is warm and dry.  Psychiatric: Affect normal.    LABS & DIAGNOSTICS:  MRI 02/25/17 Right Forefoot W/O Contrast: Soft tissue ulceration involving the medial aspect of forefoot at the level of the first MTP joint. There is underlying osteomyelitis involving the first metatarsal head. I do not see any definite findings for septic arthritis. Moderate degenerative changes noted. Surrounding cellulitis w/o myofasciitis w/o drainable soft tissue abscess or pyomyositis. The other bony structures appear intact. The medial and lateral sesamoids of the great toe are intact.  ASSESSMENT & PLAN:  Problem List Items Addressed This Visit      Endocrine   Diabetes mellitus type 2 with complications, uncontrolled ([place])    Counseled again on target blood sugar management and adequate protein intake for wound healing.         Musculoskeletal and Integument   DIABETIC FOOT ULCER with Osteomyelitis    With resolving osteomyelitis on biopsy and patient's preference will choose oral therapy for prolonged time frame. Without target  organism(s) will have him start on Doxycycline and Ciprofloxacin BID. I have counseled him on therapy and informed him to take with food to avoid GI upset. If he is intolerant to the oral therapy d/t his gastroparesis will need to reconsider tx with PICC + IV ABX.  Can continue wound care/HBO through [facility] wound clinic. FU with Dr. Amalia Hailey with Podiatry as indicated for debridements.   RTC in 4 weeks - asked that he arrange next appointment so I can see the wound progress.          I spent 15 minutes with the patient including greater than 50% of time in face to face counsel of the patient re findings on bone biopsy, projected tx for osteomyelitis, counseling on wound care, offloading therapy, BS management and in coordination of their care.  [name], MSN, [facility]  05/25/2017

## 2017-05-25 NOTE — Patient Instructions (Addendum)
You have 2 antibiotics to pick up from the store - Doxycycline and Ciprofloxacin. Please take each medication twice a day for 4 weeks until we see you back. You have a refill on these antibiotics.   Continue with hyperbaric oxygen treatments and good wound care.   Continue to try to keep your blood sugars under control.   Back to see Colletta [place] in 4 weeks.

## 2017-05-26 DIAGNOSIS — E11621 Type 2 diabetes mellitus with foot ulcer: Secondary | ICD-10-CM | POA: Diagnosis not present

## 2017-05-26 DIAGNOSIS — K3184 Gastroparesis: Secondary | ICD-10-CM | POA: Diagnosis not present

## 2017-05-26 DIAGNOSIS — L97513 Non-pressure chronic ulcer of other part of right foot with necrosis of muscle: Secondary | ICD-10-CM | POA: Diagnosis not present

## 2017-05-26 DIAGNOSIS — E1143 Type 2 diabetes mellitus with diabetic autonomic (poly)neuropathy: Secondary | ICD-10-CM | POA: Diagnosis not present

## 2017-05-26 NOTE — Progress Notes (Signed)
   Subjective:  Patient presents today status post exostectomy with bone biopsy first metatarsal right foot. DOS: 04/30/2017. Patient has a history of recurrent ulceration overlying the first MTPJ right foot. He reports purple drainage on his bandage. He states he was told Medicare will not pay for home health or the wound Center. He goes to the wound Center next Tuesday and to infectious disease on Monday. He has been taking Baxdela as directed. He presents today for further treatment and evaluation.   Past Medical History:  Diagnosis Date  . Chronic abdominal pain   . Esophagitis   . Eye hemorrhage   . Gastritis   . Gastroparesis   . GERD (gastroesophageal reflux disease)   . Glaucoma   . Helicobacter pylori (H. pylori) infection   . Hiccups   . Hypertension   . Nausea and vomiting    chronic, recurrent  . Type 2 diabetes mellitus with complications (HCC)    diagnosed around age 76      Objective/Physical Exam Skin incisions appear to be well coapted with sutures and staples intact. There is a small central portion of the incision site does have some maceration with some drainage noted. No sign of infectious process noted. No dehiscence. No active bleeding noted. Moderate edema noted to the surgical extremity. There does appear to be some localized cellulitis surrounding the incision site. The redness with erythema does not extend proximal past the incision site however. Incisional area appears to contain more rubor than usual.   Assessment: 1. s/p bone biopsy was exostectomy first metatarsal right foot. DOS: 04/30/2017 2. Ulcer right foot secondary to DM and postoperatively.   Plan of Care:  1. Patient was evaluated. 2. medically necessary excisional debridement including muscle and deep fascial tissue was performed using a tissue nipper and a chisel blade. Excisional debridement of all the necrotic nonviable tissue down to healthy bleeding viable tissue was performed with  post-debridement measurements same as pre-. 3. The wound was cleansed and dry sterile dressing applied. 4. Follow up with the wound care center in [place]. 5. Follow-up with infectious disease. 6. Continue wearing postop shoe. 7. Return to clinic in 4 weeks.    Edrick Kins, DPM [facility]  Dr. Edrick Kins, [employer]                                        [place],  [address]                Office [phone]  Fax [phone]

## 2017-05-27 DIAGNOSIS — E1143 Type 2 diabetes mellitus with diabetic autonomic (poly)neuropathy: Secondary | ICD-10-CM | POA: Diagnosis not present

## 2017-05-27 DIAGNOSIS — K3184 Gastroparesis: Secondary | ICD-10-CM | POA: Diagnosis not present

## 2017-05-27 DIAGNOSIS — L97513 Non-pressure chronic ulcer of other part of right foot with necrosis of muscle: Secondary | ICD-10-CM | POA: Diagnosis not present

## 2017-05-27 DIAGNOSIS — E11621 Type 2 diabetes mellitus with foot ulcer: Secondary | ICD-10-CM | POA: Diagnosis not present

## 2017-05-28 DIAGNOSIS — K3184 Gastroparesis: Secondary | ICD-10-CM | POA: Diagnosis not present

## 2017-05-28 DIAGNOSIS — E1143 Type 2 diabetes mellitus with diabetic autonomic (poly)neuropathy: Secondary | ICD-10-CM | POA: Diagnosis not present

## 2017-05-28 DIAGNOSIS — E11621 Type 2 diabetes mellitus with foot ulcer: Secondary | ICD-10-CM | POA: Diagnosis not present

## 2017-05-28 DIAGNOSIS — L97513 Non-pressure chronic ulcer of other part of right foot with necrosis of muscle: Secondary | ICD-10-CM | POA: Diagnosis not present

## 2017-05-31 NOTE — Assessment & Plan Note (Signed)
Counseled again on target blood sugar management and adequate protein intake for wound healing.

## 2017-05-31 NOTE — Assessment & Plan Note (Addendum)
With resolving osteomyelitis on biopsy and patient's preference will choose oral therapy for prolonged time frame. Without target organism(s) will have him start on Doxycycline and Ciprofloxacin BID. I have counseled him on therapy and informed him to take with food to avoid GI upset. If he is intolerant to the oral therapy d/t his gastroparesis will need to reconsider tx with PICC + IV ABX.  Can continue wound care/HBO through [facility] clinic. FU with Dr. Amalia Hailey with Podiatry as indicated for debridements.   RTC in 4 weeks - asked that he arrange next appointment so I can see the wound progress.

## 2017-06-02 DIAGNOSIS — E11621 Type 2 diabetes mellitus with foot ulcer: Secondary | ICD-10-CM | POA: Diagnosis not present

## 2017-06-02 DIAGNOSIS — L97513 Non-pressure chronic ulcer of other part of right foot with necrosis of muscle: Secondary | ICD-10-CM | POA: Diagnosis not present

## 2017-06-02 DIAGNOSIS — K3184 Gastroparesis: Secondary | ICD-10-CM | POA: Diagnosis not present

## 2017-06-02 DIAGNOSIS — E1143 Type 2 diabetes mellitus with diabetic autonomic (poly)neuropathy: Secondary | ICD-10-CM | POA: Diagnosis not present

## 2017-06-03 DIAGNOSIS — E1143 Type 2 diabetes mellitus with diabetic autonomic (poly)neuropathy: Secondary | ICD-10-CM | POA: Diagnosis not present

## 2017-06-03 DIAGNOSIS — K3184 Gastroparesis: Secondary | ICD-10-CM | POA: Diagnosis not present

## 2017-06-03 DIAGNOSIS — E11621 Type 2 diabetes mellitus with foot ulcer: Secondary | ICD-10-CM | POA: Diagnosis not present

## 2017-06-03 DIAGNOSIS — L97513 Non-pressure chronic ulcer of other part of right foot with necrosis of muscle: Secondary | ICD-10-CM | POA: Diagnosis not present

## 2017-06-04 DIAGNOSIS — E11621 Type 2 diabetes mellitus with foot ulcer: Secondary | ICD-10-CM | POA: Diagnosis not present

## 2017-06-04 DIAGNOSIS — L97513 Non-pressure chronic ulcer of other part of right foot with necrosis of muscle: Secondary | ICD-10-CM | POA: Diagnosis not present

## 2017-06-04 DIAGNOSIS — K3184 Gastroparesis: Secondary | ICD-10-CM | POA: Diagnosis not present

## 2017-06-04 DIAGNOSIS — E1143 Type 2 diabetes mellitus with diabetic autonomic (poly)neuropathy: Secondary | ICD-10-CM | POA: Diagnosis not present

## 2017-06-09 DIAGNOSIS — L97513 Non-pressure chronic ulcer of other part of right foot with necrosis of muscle: Secondary | ICD-10-CM | POA: Diagnosis not present

## 2017-06-09 DIAGNOSIS — E1143 Type 2 diabetes mellitus with diabetic autonomic (poly)neuropathy: Secondary | ICD-10-CM | POA: Diagnosis not present

## 2017-06-09 DIAGNOSIS — K3184 Gastroparesis: Secondary | ICD-10-CM | POA: Diagnosis not present

## 2017-06-09 DIAGNOSIS — E11621 Type 2 diabetes mellitus with foot ulcer: Secondary | ICD-10-CM | POA: Diagnosis not present

## 2017-06-10 ENCOUNTER — Inpatient Hospital Stay (HOSPITAL_COMMUNITY)
Admission: EM | Admit: 2017-06-10 | Discharge: 2017-06-16 | DRG: 638 | Disposition: A | Discharge status: Home / Self Care | Payer: Medicare Other | Attending: Family Medicine | Admitting: Family Medicine

## 2017-06-10 ENCOUNTER — Encounter (HOSPITAL_COMMUNITY): Payer: Self-pay | Admitting: Emergency Medicine

## 2017-06-10 ENCOUNTER — Emergency Department (HOSPITAL_COMMUNITY): Payer: Medicare Other

## 2017-06-10 DIAGNOSIS — Z880 Allergy status to penicillin: Secondary | ICD-10-CM

## 2017-06-10 DIAGNOSIS — Z888 Allergy status to other drugs, medicaments and biological substances status: Secondary | ICD-10-CM

## 2017-06-10 DIAGNOSIS — E118 Type 2 diabetes mellitus with unspecified complications: Secondary | ICD-10-CM

## 2017-06-10 DIAGNOSIS — N179 Acute kidney failure, unspecified: Secondary | ICD-10-CM | POA: Diagnosis present

## 2017-06-10 DIAGNOSIS — E1143 Type 2 diabetes mellitus with diabetic autonomic (poly)neuropathy: Secondary | ICD-10-CM | POA: Diagnosis present

## 2017-06-10 DIAGNOSIS — L97414 Non-pressure chronic ulcer of right heel and midfoot with necrosis of bone: Secondary | ICD-10-CM

## 2017-06-10 DIAGNOSIS — E876 Hypokalemia: Secondary | ICD-10-CM | POA: Diagnosis present

## 2017-06-10 DIAGNOSIS — N186 End stage renal disease: Secondary | ICD-10-CM | POA: Diagnosis present

## 2017-06-10 DIAGNOSIS — I10 Essential (primary) hypertension: Secondary | ICD-10-CM | POA: Diagnosis not present

## 2017-06-10 DIAGNOSIS — M869 Osteomyelitis, unspecified: Secondary | ICD-10-CM | POA: Diagnosis not present

## 2017-06-10 DIAGNOSIS — E1165 Type 2 diabetes mellitus with hyperglycemia: Secondary | ICD-10-CM | POA: Diagnosis present

## 2017-06-10 DIAGNOSIS — Z961 Presence of intraocular lens: Secondary | ICD-10-CM | POA: Diagnosis present

## 2017-06-10 DIAGNOSIS — Z9114 Patient's other noncompliance with medication regimen: Secondary | ICD-10-CM

## 2017-06-10 DIAGNOSIS — K3184 Gastroparesis: Secondary | ICD-10-CM | POA: Diagnosis not present

## 2017-06-10 DIAGNOSIS — D638 Anemia in other chronic diseases classified elsewhere: Secondary | ICD-10-CM | POA: Diagnosis present

## 2017-06-10 DIAGNOSIS — H409 Unspecified glaucoma: Secondary | ICD-10-CM | POA: Diagnosis not present

## 2017-06-10 DIAGNOSIS — R066 Hiccough: Secondary | ICD-10-CM | POA: Diagnosis present

## 2017-06-10 DIAGNOSIS — E1122 Type 2 diabetes mellitus with diabetic chronic kidney disease: Secondary | ICD-10-CM | POA: Diagnosis present

## 2017-06-10 DIAGNOSIS — F329 Major depressive disorder, single episode, unspecified: Secondary | ICD-10-CM | POA: Diagnosis present

## 2017-06-10 DIAGNOSIS — E11628 Type 2 diabetes mellitus with other skin complications: Secondary | ICD-10-CM | POA: Diagnosis present

## 2017-06-10 DIAGNOSIS — I739 Peripheral vascular disease, unspecified: Secondary | ICD-10-CM

## 2017-06-10 DIAGNOSIS — R112 Nausea with vomiting, unspecified: Secondary | ICD-10-CM | POA: Diagnosis present

## 2017-06-10 DIAGNOSIS — R9431 Abnormal electrocardiogram [ECG] [EKG]: Secondary | ICD-10-CM | POA: Diagnosis not present

## 2017-06-10 DIAGNOSIS — Z886 Allergy status to analgesic agent status: Secondary | ICD-10-CM

## 2017-06-10 DIAGNOSIS — Z883 Allergy status to other anti-infective agents status: Secondary | ICD-10-CM

## 2017-06-10 DIAGNOSIS — Z6841 Body Mass Index (BMI) 40.0 and over, adult: Secondary | ICD-10-CM | POA: Diagnosis not present

## 2017-06-10 DIAGNOSIS — Z91013 Allergy to seafood: Secondary | ICD-10-CM

## 2017-06-10 DIAGNOSIS — E785 Hyperlipidemia, unspecified: Secondary | ICD-10-CM | POA: Diagnosis present

## 2017-06-10 DIAGNOSIS — Z794 Long term (current) use of insulin: Secondary | ICD-10-CM

## 2017-06-10 DIAGNOSIS — E1169 Type 2 diabetes mellitus with other specified complication: Principal | ICD-10-CM | POA: Diagnosis present

## 2017-06-10 DIAGNOSIS — E1151 Type 2 diabetes mellitus with diabetic peripheral angiopathy without gangrene: Secondary | ICD-10-CM | POA: Diagnosis present

## 2017-06-10 DIAGNOSIS — K219 Gastro-esophageal reflux disease without esophagitis: Secondary | ICD-10-CM | POA: Diagnosis present

## 2017-06-10 DIAGNOSIS — Z79899 Other long term (current) drug therapy: Secondary | ICD-10-CM

## 2017-06-10 DIAGNOSIS — IMO0002 Reserved for concepts with insufficient information to code with codable children: Secondary | ICD-10-CM

## 2017-06-10 DIAGNOSIS — D696 Thrombocytopenia, unspecified: Secondary | ICD-10-CM | POA: Diagnosis present

## 2017-06-10 DIAGNOSIS — L089 Local infection of the skin and subcutaneous tissue, unspecified: Secondary | ICD-10-CM | POA: Diagnosis present

## 2017-06-10 DIAGNOSIS — Z833 Family history of diabetes mellitus: Secondary | ICD-10-CM

## 2017-06-10 DIAGNOSIS — E11621 Type 2 diabetes mellitus with foot ulcer: Secondary | ICD-10-CM | POA: Diagnosis present

## 2017-06-10 DIAGNOSIS — L97509 Non-pressure chronic ulcer of other part of unspecified foot with unspecified severity: Secondary | ICD-10-CM | POA: Diagnosis present

## 2017-06-10 LAB — COMPREHENSIVE METABOLIC PANEL
ALT: 26 U/L (ref 17–63)
AST: 23 U/L (ref 15–41)
Albumin: 3.8 g/dL (ref 3.5–5.0)
Alkaline Phosphatase: 70 U/L (ref 38–126)
Anion gap: 10 (ref 5–15)
BUN: 17 mg/dL (ref 6–20)
CO2: 24 mmol/L (ref 22–32)
Calcium: 9.1 mg/dL (ref 8.9–10.3)
Chloride: 101 mmol/L (ref 101–111)
Creatinine, Ser: 1.33 mg/dL — ABNORMAL HIGH (ref 0.61–1.24)
GFR calc Af Amer: >60 mL/min (ref >60)
GFR calc non Af Amer: >60 mL/min (ref >60)
Glucose, Bld: 263 mg/dL — ABNORMAL HIGH (ref 65–99)
Potassium: 3.8 mmol/L (ref 3.5–5.1)
Sodium: 135 mmol/L (ref 135–145)
Total Bilirubin: 1.1 mg/dL (ref 0.3–1.2)
Total Protein: 7.6 g/dL (ref 6.5–8.1)

## 2017-06-10 LAB — URINALYSIS, ROUTINE W REFLEX MICROSCOPIC
Bacteria, UA: NONE SEEN
Bilirubin Urine: NEGATIVE
Glucose, UA: 150 mg/dL — AB
Ketones, ur: NEGATIVE mg/dL
Leukocytes, UA: NEGATIVE
Nitrite: NEGATIVE
Protein, ur: >=300 mg/dL — AB
Specific Gravity, Urine: 1.015 (ref 1.005–1.030)
Squamous Epithelial / LPF: NONE SEEN
pH: 7 (ref 5.0–8.0)

## 2017-06-10 LAB — CBC
HCT: 35.3 % — ABNORMAL LOW (ref 39.0–52.0)
Hemoglobin: 12.4 g/dL — ABNORMAL LOW (ref 13.0–17.0)
MCH: 30.8 pg (ref 26.0–34.0)
MCHC: 35.1 g/dL (ref 30.0–36.0)
MCV: 87.8 fL (ref 78.0–100.0)
Platelets: 140 10*3/uL — ABNORMAL LOW (ref 150–400)
RBC: 4.02 MIL/uL — ABNORMAL LOW (ref 4.22–5.81)
RDW: 12.6 % (ref 11.5–15.5)
WBC: 9.1 10*3/uL (ref 4.0–10.5)

## 2017-06-10 LAB — TROPONIN I
Troponin I: <0.03 ng/mL (ref <0.03)
Troponin I: <0.03 ng/mL (ref <0.03)

## 2017-06-10 LAB — GLUCOSE, CAPILLARY: Glucose-Capillary: 222 mg/dL — ABNORMAL HIGH (ref 65–99)

## 2017-06-10 LAB — MRSA PCR SCREENING: MRSA by PCR: NEGATIVE

## 2017-06-10 LAB — LIPASE, BLOOD: Lipase: 26 U/L (ref 11–51)

## 2017-06-10 MED ORDER — ACETAMINOPHEN 650 MG RE SUPP: 650.0000 mg | Freq: Four times a day (QID) | RECTAL | Status: DC | PRN

## 2017-06-10 MED ORDER — SODIUM CHLORIDE 0.9 % IV BOLUS (SEPSIS)
1000.0000 mL | Freq: Once | INTRAVENOUS | Status: AC
  Administered 2017-06-10: 1000 mL via INTRAVENOUS

## 2017-06-10 MED ORDER — DOXYCYCLINE HYCLATE 100 MG IV SOLR
100.0000 mg | Freq: Two times a day (BID) | INTRAVENOUS | Status: DC
  Administered 2017-06-11 – 2017-06-16 (×10): 100 mg via INTRAVENOUS
  Filled 2017-06-10 (×14): qty 100

## 2017-06-10 MED ORDER — FENTANYL CITRATE (PF) 100 MCG/2ML IJ SOLN
50.0000 ug | INTRAMUSCULAR | Status: AC | PRN
  Administered 2017-06-10 – 2017-06-11 (×2): 50 ug via INTRAVENOUS
  Filled 2017-06-10 (×3): qty 2

## 2017-06-10 MED ORDER — DIPHENHYDRAMINE HCL 50 MG/ML IJ SOLN
50.0000 mg | Freq: Once | INTRAMUSCULAR | Status: AC
  Administered 2017-06-10: 50 mg via INTRAVENOUS
  Filled 2017-06-10: qty 1

## 2017-06-10 MED ORDER — ONDANSETRON HCL 4 MG/2ML IJ SOLN
4.0000 mg | INTRAMUSCULAR | Status: AC | PRN
  Administered 2017-06-10 – 2017-06-11 (×2): 4 mg via INTRAVENOUS
  Filled 2017-06-10 (×4): qty 2

## 2017-06-10 MED ORDER — SODIUM CHLORIDE 0.9 % IV SOLN
INTRAVENOUS | Status: AC
  Administered 2017-06-10: 100 mL via INTRAVENOUS

## 2017-06-10 MED ORDER — LORAZEPAM 2 MG/ML IJ SOLN
1.0000 mg | INTRAMUSCULAR | Status: DC | PRN
  Administered 2017-06-11 – 2017-06-16 (×11): 1 mg via INTRAVENOUS
  Filled 2017-06-10 (×11): qty 1

## 2017-06-10 MED ORDER — FAMOTIDINE IN NACL 20-0.9 MG/50ML-% IV SOLN
20.0000 mg | Freq: Once | INTRAVENOUS | Status: AC
  Administered 2017-06-10: 20 mg via INTRAVENOUS
  Filled 2017-06-10: qty 50

## 2017-06-10 MED ORDER — ENOXAPARIN SODIUM 40 MG/0.4ML ~~LOC~~ SOLN
40.0000 mg | SUBCUTANEOUS | Status: DC
  Administered 2017-06-10 – 2017-06-13 (×4): 40 mg via SUBCUTANEOUS
  Filled 2017-06-10 (×5): qty 0.4

## 2017-06-10 MED ORDER — INSULIN ASPART 100 UNIT/ML ~~LOC~~ SOLN
0.0000 [IU] | Freq: Three times a day (TID) | SUBCUTANEOUS | Status: DC
  Administered 2017-06-11: 3 [IU] via SUBCUTANEOUS
  Administered 2017-06-11: 5 [IU] via SUBCUTANEOUS

## 2017-06-10 MED ORDER — ONDANSETRON HCL 4 MG/2ML IJ SOLN
4.0000 mg | Freq: Four times a day (QID) | INTRAMUSCULAR | Status: DC | PRN
  Administered 2017-06-11 – 2017-06-12 (×2): 4 mg via INTRAVENOUS
  Filled 2017-06-10: qty 2

## 2017-06-10 MED ORDER — DICYCLOMINE HCL 10 MG/ML IM SOLN
10.0000 mg | Freq: Three times a day (TID) | INTRAMUSCULAR | Status: DC
  Administered 2017-06-10: 10 mg via INTRAMUSCULAR
  Filled 2017-06-10: qty 2
  Filled 2017-06-10 (×4): qty 1

## 2017-06-10 MED ORDER — CIPROFLOXACIN IN D5W 400 MG/200ML IV SOLN
400.0000 mg | Freq: Two times a day (BID) | INTRAVENOUS | Status: DC
  Administered 2017-06-10 – 2017-06-16 (×12): 400 mg via INTRAVENOUS
  Filled 2017-06-10 (×12): qty 200

## 2017-06-10 MED ORDER — HYDRALAZINE HCL 20 MG/ML IJ SOLN
5.0000 mg | INTRAMUSCULAR | Status: DC | PRN
  Administered 2017-06-13: 5 mg via INTRAVENOUS
  Filled 2017-06-10: qty 1

## 2017-06-10 MED ORDER — DICYCLOMINE HCL 10 MG/ML IM SOLN
20.0000 mg | Freq: Once | INTRAMUSCULAR | Status: AC
  Administered 2017-06-10: 20 mg via INTRAMUSCULAR
  Filled 2017-06-10: qty 2

## 2017-06-10 MED ORDER — METOCLOPRAMIDE HCL 5 MG/ML IJ SOLN
10.0000 mg | Freq: Once | INTRAMUSCULAR | Status: AC
  Administered 2017-06-10: 10 mg via INTRAVENOUS
  Filled 2017-06-10: qty 2

## 2017-06-10 MED ORDER — LORAZEPAM 2 MG/ML IJ SOLN
0.5000 mg | Freq: Once | INTRAMUSCULAR | Status: AC
  Administered 2017-06-10: 0.5 mg via INTRAVENOUS
  Filled 2017-06-10: qty 1

## 2017-06-10 MED ORDER — PANTOPRAZOLE SODIUM 40 MG IV SOLR
40.0000 mg | Freq: Every day | INTRAVENOUS | Status: DC
  Administered 2017-06-10 – 2017-06-14 (×5): 40 mg via INTRAVENOUS
  Filled 2017-06-10 (×5): qty 40

## 2017-06-10 MED ORDER — ONDANSETRON HCL 4 MG PO TABS: 4.0000 mg | ORAL_TABLET | Freq: Four times a day (QID) | ORAL | Status: DC | PRN

## 2017-06-10 MED ORDER — MORPHINE SULFATE (PF) 2 MG/ML IV SOLN
1.0000 mg | INTRAVENOUS | Status: DC | PRN
  Administered 2017-06-11 – 2017-06-14 (×7): 1 mg via INTRAVENOUS
  Filled 2017-06-10 (×8): qty 1

## 2017-06-10 MED ORDER — ACETAMINOPHEN 325 MG PO TABS: 650.0000 mg | ORAL_TABLET | Freq: Four times a day (QID) | ORAL | Status: DC | PRN

## 2017-06-10 NOTE — ED Triage Notes (Signed)
Pt reports epigastric pain radiating to chest. Pt describes pain as "cramping sensation." emesis x1 noted in triage. Pt reports history of gastroparesis.

## 2017-06-10 NOTE — Progress Notes (Signed)
Pharmacy Antibiotic Note  URIYAH RASKA is a 46 y.o. male admitted on 06/10/2017 with osteomyelitis.  Pharmacy has been consulted for Cipro dosing.  Plan: Cipro 400mg  IV every 12 hours. Monitor labs, micro and vitals.   Height: 5\' 9"  (175.3 cm) Weight: 295 lb 3.1 oz (133.9 kg) IBW/kg (Calculated) : 70.7  Temp (24hrs), Avg:99 F (37.2 C), Min:98.4 F (36.9 C), Max:99.5 F (37.5 C)   Recent Labs Lab 06/10/17 1145  WBC 9.1  CREATININE 1.33*    Estimated Creatinine Clearance: 93.2 mL/min (A) (by C-G formula based on SCr of 1.33 mg/dL (H)).    Allergies  Allergen Reactions  . Donnatal [Pb-Hyoscy-Atropine-Scopol Er] Anaphylaxis    Reaction to GI cocktail  . Fish Allergy Anaphylaxis, Hives and Rash  . Haldol [Haloperidol Lactate] Other (See Comments)    Chest Pain  . Lidocaine Anaphylaxis    Reaction to GI cocktail  . Maalox [Calcium Carbonate Antacid] Anaphylaxis    Reaction to GI cocktail  . Aspirin Hives and Itching  . Bactrim [Sulfamethoxazole-Trimethoprim] Itching  . Penicillins Hives and Itching    Has patient had a PCN reaction causing immediate rash, facial/tongue/throat swelling, SOB or lightheadedness with hypotension: yes Has patient had a PCN reaction causing severe rash involving mucus membranes or skin necrosis: yes Has patient had a PCN reaction that required hospitalization no Has patient had a PCN reaction occurring within the last 10 years: yes If all of the above answers are "NO", then may proceed with Cephalospor  . Phenergan [Promethazine Hcl] Nausea And Vomiting    Antimicrobials this admission: Cipro 10/31 >>  Doxy 10/31 >>   Dose adjustments this admission: n/a   Microbiology results:  BCx:   UCx:    Sputum:    MRSA PCR:   Thank you for allowing pharmacy to be a part of this patient's care.  [name] 06/10/2017 6:32 PM

## 2017-06-10 NOTE — ED Notes (Signed)
Pt states abd pain since yesterday. Emesis X2 in ED room. Clear phlegm.

## 2017-06-10 NOTE — ED Notes (Signed)
Pt back from Xray, radiology staff says pt unable to sit still due to cramping in chest.  Dr. Thurnell Garbe aware and meds ordered.

## 2017-06-10 NOTE — H&P (Addendum)
History and Physical    Timothy Clark ZDG:[id number] DOB: July 08, 1971 DOA: 06/10/2017  PCP: Tawni Carnes, PA-C  Patient coming from: Home  Chief Complaint: Nausea and vomiting  HPI: Timothy Clark is a 46 y.o. male with medical history significant of gastritis, gastroparesis, GERD, Glaucoma, HTN, Type II DM with complications that presents with nausea and vomiting for which patient has a history of.  Per patient nausea and vomiting began yesterday.  Having cramping abdominal pain.  Unable to take usual medications due to his nausea and vomiting.  Has had 5 ED visits in the past 6 months for these symptoms.  Denies fevers, chills, chest pressure, shortness of breath.  Patient reports that he has had about 10 episodes of emesis since yesterday.  Denies blood in emesis.  Patient reports chest pain that feels like a cramp that is accompanying dry heaves and vomiting.  States emesis at this time is mostly mucuous and is mostly just dry heaving.  Denies any sick contacts.    ED Course: Seen by Dr. Thurnell Garbe who ordered troponin (<0.03), UA (with small Hgb, >300 glucose, negative LE and nitrite), Cr of 1.33 (around baseline).  Was given reglan, benadryl, zofran, ativan, bentyl, pepcid, fentanyl without relief.  [facility] asked to admit for intractable nausea and vomiting.   Review of Systems: As per HPI otherwise 10 point review of systems negative.    Past Medical History:  Diagnosis Date  . Chronic abdominal pain   . Esophagitis   . Eye hemorrhage   . Gastritis   . Gastroparesis   . GERD (gastroesophageal reflux disease)   . Glaucoma   . Helicobacter pylori (H. pylori) infection   . Hiccups   . Hypertension   . Nausea and vomiting    chronic, recurrent  . Type 2 diabetes mellitus with complications (HCC)    diagnosed around age 31    Past Surgical History:  Procedure Laterality Date  . CATARACT EXTRACTION W/PHACO Left 05/19/2016   Procedure: CATARACT EXTRACTION PHACO AND INTRAOCULAR  LENS PLACEMENT LEFT EYE;  Surgeon: Tonny Branch, MD;  Location: [facility];  Service: Ophthalmology;  Laterality: Left;  CDE: 7.30  . CATARACT EXTRACTION W/PHACO Right 06/23/2016   Procedure: CATARACT EXTRACTION PHACO AND INTRAOCULAR LENS PLACEMENT RIGHT EYE CDE=9.87;  Surgeon: Tonny Branch, MD;  Location: [facility];  Service: Ophthalmology;  Laterality: Right;  right  . ESOPHAGOGASTRODUODENOSCOPY  2015   Dr. Britta Mccreedy  . ESOPHAGOGASTRODUODENOSCOPY N/A 10/26/2014   RMR: Distal esophagititis-likely reflux related although an element of pill induced injuruy no exclueded  status post biopsy. Diffusely abnormal gastric mucosa of uncertain signigicane -status post gastric biopsy. Focal area of excoriation in the cardia most consistant with a trauma of heaving.   [name] EYE SURGERY  2015   stent placed to left eye  . EYE SURGERY       reports that he has never smoked. He has never used smokeless tobacco. He reports that he does not drink alcohol or use drugs.  Allergies  Allergen Reactions  . Donnatal [Pb-Hyoscy-Atropine-Scopol Er] Anaphylaxis    Reaction to GI cocktail  . Fish Allergy Anaphylaxis, Hives and Rash  . Haldol [Haloperidol Lactate] Other (See Comments)    Chest Pain  . Lidocaine Anaphylaxis    Reaction to GI cocktail  . Maalox [Calcium Carbonate Antacid] Anaphylaxis    Reaction to GI cocktail  . Aspirin Hives and Itching  . Bactrim [Sulfamethoxazole-Trimethoprim] Itching  . Penicillins Hives and Itching    Has patient had  a PCN reaction causing immediate rash, facial/tongue/throat swelling, SOB or lightheadedness with hypotension: yes Has patient had a PCN reaction causing severe rash involving mucus membranes or skin necrosis: yes Has patient had a PCN reaction that required hospitalization no Has patient had a PCN reaction occurring within the last 10 years: yes If all of the above answers are "NO", then may proceed with Cephalospor  . Phenergan [Promethazine Hcl] Nausea And Vomiting     Family History  Problem Relation Age of Onset  . Ovarian cancer Mother   . Cervical cancer Sister   . Diabetes Sister   . Colon cancer Neg Hx     Prior to Admission medications   Medication Sig Start Date End Date Taking? Authorizing Provider  amLODipine (NORVASC) 5 MG tablet Take 5 mg by mouth daily.   Yes [provider]  atorvastatin (LIPITOR) 10 MG tablet Take 10 mg by mouth daily.   Yes [provider]  brimonidine (ALPHAGAN) 0.15 % ophthalmic solution Place 1 drop into both eyes 2 (two) times daily.    Yes [provider]  cetirizine (ZYRTEC) 10 MG tablet Take 10 mg by mouth daily.   Yes [provider]  ciprofloxacin (CIPRO) 500 MG tablet Take 1 tablet (500 mg total) by mouth 2 (two) times daily. 05/25/17  Yes [place] Callas, NP  cyclobenzaprine (FLEXERIL) 10 MG tablet Take 10 mg by mouth at bedtime as needed for muscle spasms.    Yes [provider]  Delafloxacin Meglumine (BAXDELA) 450 MG TABS Take 1 tablet by mouth 2 (two) times daily. 05/06/17   Edrick Kins, DPM  dicyclomine (BENTYL) 10 MG capsule Take 1 capsule (10 mg total) by mouth 4 (four) times daily -  before meals and at bedtime. Patient taking differently: Take 10 mg by mouth 2 (two) times daily.  12/26/16   Annitta Needs, NP  doxycycline (VIBRA-TABS) 100 MG tablet Take 1 tablet (100 mg total) by mouth 2 (two) times daily. 05/25/17   Estill Callas, NP  gabapentin (NEURONTIN) 600 MG tablet Take 600 mg by mouth 4 (four) times daily as needed (for neuropathy).     [provider]  hydrochlorothiazide (HYDRODIURIL) 25 MG tablet Take 25 mg by mouth daily.    [provider]  insulin detemir (LEVEMIR) 100 UNIT/ML injection Inject 20 Units into the skin at bedtime as needed.     [provider]  insulin NPH-regular Human (NOVOLIN 70/30) (70-30) 100 UNIT/ML injection Inject 35 Units into the skin 2 (two) times daily.    [provider]   lisinopril (PRINIVIL,ZESTRIL) 40 MG tablet Take 40 mg by mouth daily.    [provider]  LORazepam (ATIVAN) 0.5 MG tablet Take 0.5 mg by mouth at bedtime as needed for anxiety.    [provider]  metoCLOPramide (REGLAN) 10 MG tablet Take 1 tablet (10 mg total) by mouth every 6 (six) hours as needed for nausea (nausea/headache). 02/21/17   Milton Ferguson, MD  pantoprazole (PROTONIX) 40 MG tablet TAKE 1 TABLET BY MOUTH TWICE DAILY BEFORE  A  MEAL 03/23/17   Mahala Menghini, PA-C  sitaGLIPtin (JANUVIA) 100 MG tablet Take 100 mg by mouth daily.    [provider]  sucralfate (CARAFATE) 1 g tablet TAKE ONE TABLET BY MOUTH 4 TIMES DAILY 03/12/16   Annitta Needs, NP  timolol (BETIMOL) 0.5 % ophthalmic solution Place 1 drop into both eyes 2 (two) times daily.    [provider]    Physical Exam: Vitals:   06/10/17 1145 06/10/17 1213 06/10/17 1348 06/10/17 1453  BP: (!) 172/117 (!) 193/103 (!) 179/105 (!) 174/105  Pulse: 76  85 89  Resp: 20  16 18   Temp: 98.4 F (36.9 C)     TempSrc: Oral     SpO2: 100% 100% 98% 99%  Weight:      Height:          Constitutional: distress, dry heaving throughout exam Vitals:   06/10/17 1145 06/10/17 1213 06/10/17 1348 06/10/17 1453  BP: (!) 172/117 (!) 193/103 (!) 179/105 (!) 174/105  Pulse: 76  85 89  Resp: 20  16 18   Temp: 98.4 F (36.9 C)     TempSrc: Oral     SpO2: 100% 100% 98% 99%  Weight:      Height:       Eyes: PERRL, lids and conjunctivae normal, strabismus ENMT: Mucous membranes are moist. Posterior pharynx clear of any exudate or lesions. Poor dentition Neck: normal, supple, no masses, no thyromegaly Respiratory: clear to auscultation bilaterally, no wheezing, no crackles. Normal respiratory effort. No accessory muscle use.  Cardiovascular: Regular rate and rhythm, no murmurs / rubs / gallops. 2+ edema bilaterally shin distal, compression stockings in place. No carotid bruits.  Abdomen: obese, no  tenderness, no masses palpated. No hepatosplenomegaly. Bowel sounds positive.  Musculoskeletal: no clubbing / cyanosis. No joint deformity upper and lower extremities. Good ROM, no contractures. Normal muscle tone.  Skin: skin thickening of knees bilaterally, bilateral lower extremities with compression stockings, right foot with bandage over toes Neurologic: CN 2-12 grossly intact.   Psychiatric: Normal judgment and insight. Alert and oriented x 3. Normal mood.     Labs on Admission: I have personally reviewed following labs and imaging studies  CBC:  Recent Labs Lab 06/10/17 1145  WBC 9.1  HGB 12.4*  HCT 35.3*  MCV 87.8  PLT 588*   Basic Metabolic Panel:  Recent Labs Lab 06/10/17 1145  NA 135  K 3.8  CL 101  CO2 24  GLUCOSE 263*  BUN 17  CREATININE 1.33*  CALCIUM 9.1   GFR: Estimated Creatinine Clearance: 94.8 mL/min (A) (by C-G formula based on SCr of 1.33 mg/dL (H)). Liver Function Tests:  Recent Labs Lab 06/10/17 1145  AST 23  ALT 26  ALKPHOS 70  BILITOT 1.1  PROT 7.6  ALBUMIN 3.8    Recent Labs Lab 06/10/17 1145  LIPASE 26   No results for input(s): AMMONIA in the last 168 hours. Coagulation Profile: No results for input(s): INR, PROTIME in the last 168 hours. Cardiac Enzymes:  Recent Labs Lab 06/10/17 1308  TROPONINI <0.03   BNP (last 3 results) No results for input(s): PROBNP in the last 8760 hours. HbA1C: No results for input(s): HGBA1C in the last 72 hours. CBG: No results for input(s): GLUCAP in the last 168 hours. Lipid Profile: No results for input(s): CHOL, HDL, LDLCALC, TRIG, CHOLHDL, LDLDIRECT in the last 72 hours. Thyroid Function Tests: No results for input(s): TSH, T4TOTAL, FREET4, T3FREE, THYROIDAB in the last 72 hours. Anemia Panel: No results for input(s): VITAMINB12, FOLATE, FERRITIN, TIBC, IRON, RETICCTPCT in the last 72 hours. Urine analysis:    Component Value Date/Time   COLORURINE YELLOW 06/10/2017 1140    APPEARANCEUR CLEAR 06/10/2017 1140   LABSPEC 1.015 06/10/2017 1140   PHURINE 7.0 06/10/2017 1140   GLUCOSEU 150 (A) 06/10/2017 1140   HGBUR SMALL (A) 06/10/2017 1140   BILIRUBINUR NEGATIVE 06/10/2017  [place] 06/10/2017 1140   PROTEINUR >=300 (A) 06/10/2017 1140   UROBILINOGEN 0.2 06/07/2015 1353   NITRITE NEGATIVE 06/10/2017 [place] 06/10/2017 1140   Sepsis Labs: !!!!!!!!!!!!!!!!!!!!!!!!!!!!!!!!!!!!!!!!!!!! @LABRCNTIP (procalcitonin:4,lacticidven:4) )No results found for this or any previous visit (from the past 240 hour(s)).   Radiological Exams on Admission: Dg Abd Acute W/chest  Result Date: 06/10/2017 CLINICAL DATA:  46 year old male with nausea vomiting and abdominal pain since yesterday. EXAM: DG ABDOMEN ACUTE W/ 1V CHEST COMPARISON:  02/10/2017 and earlier. FINDINGS: Seated upright AP view of the chest. Lordotic positioning. Mediastinal contours and lung parenchyma suspected to be stable. Visualized tracheal air column is within normal limits. No pneumoperitoneum identified. Non obstructed bowel gas pattern. Abdominal and pelvic visceral contours are within normal limits. No acute osseous abnormality identified. Chronic right lateral L4-L5 endplate degeneration. IMPRESSION: 1.  Normal bowel gas pattern, no free air. 2.  No acute cardiopulmonary abnormality. Electronically Signed   By: Genevie Ann M.D.   On: 06/10/2017 15:36    EKG: Independently reviewed. Sinus rhythm with PVCs  Assessment/Plan Principal Problem:   Intractable nausea and vomiting Active Problems:   Diabetes mellitus type 2 with complications, uncontrolled (HCC)   UNSPECIFIED PERIPHERAL VASCULAR DISEASE   DIABETIC FOOT ULCER with Osteomyelitis   Essential hypertension   Gastroparesis due to DM (HCC)   AKI (acute kidney injury) (HCC)    Intractable nausea and vomiting - IVF of NS at 175ml/hr - IV zofran, pepcid, reglan - sucrafate - NPO - fentanyl for pain - repeat  BMP in am  Chest pain - cycle troponins  Osteomyelitis - continue cipro and flagyl per pharm IV   Diabetes mellitus Type II - SSI  Diabetic foot ulcer with osteo - continue ciprofloxacin and doxycycline  Essential HTN - IV hydralazine for SBP >180 - will add IV metoprolol - will need to restart home medications when able to tolerate PO  AKI - Cr actually appears to be almost at baseline - repeat BMP in am    DVT prophylaxis: lovenox, SCDs Code Status:  Full Code  Family Communication:  No family bedside  Disposition Plan: likely home when improved  Consults called: none Admission status:  Obs, med surg   Loretha Stapler MD [facility] Pager [phone]  If 7PM-7AM, please contact night-coverage [email or web address] Password [email or web address]  06/10/2017, 4:26 PM

## 2017-06-10 NOTE — ED Provider Notes (Signed)
[facility] EMERGENCY DEPARTMENT Provider Note   CSN: [id number] Arrival date & time: 06/10/17  1132     History   Chief Complaint Chief Complaint  Patient presents with  . Abdominal Pain    HPI Timothy Clark is a 46 y.o. male.  HPI  Pt was seen at 1210. Per pt, c/o gradual onset and persistence of multiple intermittent episodes of N/V that began yesterday. Has been associated with "cramping" epigastric abd pain. Describes his symptoms as "my gastroparesis." Pt was unable to take his usual medications this morning due to his symptoms. Denies CP/SOB, no back pain, no fevers, no black or blood in stools or emesis. The symptoms have been associated with no other complaints. The patient has a significant history of similar symptoms previously, recently being evaluated for this complaint and multiple prior evals for same.      Past Medical History:  Diagnosis Date  . Chronic abdominal pain   . Esophagitis   . Eye hemorrhage   . Gastritis   . Gastroparesis   . GERD (gastroesophageal reflux disease)   . Glaucoma   . Helicobacter pylori (H. pylori) infection   . Hiccups   . Hypertension   . Nausea and vomiting    chronic, recurrent  . Type 2 diabetes mellitus with complications (HCC)    diagnosed around age 17    Patient Active Problem List   Diagnosis Date Noted  . Pressure injury of skin 02/01/2017  . Psychosis ([place]) 01/31/2017  . Hypokalemia 01/31/2017  . AKI (acute kidney injury) ([place]) 09/07/2016  . Insulin dependent diabetes mellitus ([place]) 09/07/2016  . Orthostatic syncope 09/05/2016  . Syncope and collapse   . 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 Diarrhea 09/27/2015  . Gastroparesis due to DM ([place]) 02/06/2015  . Intractable vomiting 01/15/2015  . Gastritis   . Essential hypertension 12/06/2014  . H. pylori infection 11/27/2014  . Mucosal abnormality of stomach   . Mucosal abnormality of esophagus   . Nausea vomiting and diarrhea 10/25/2014  . DIABETIC FOOT  ULCER with Osteomyelitis 02/14/2010  . LIMB PAIN 12/11/2009  . UNSPECIFIED PERIPHERAL VASCULAR DISEASE 10/19/2009  . ERECTILE DYSFUNCTION, ORGANIC 10/19/2009  . NUMBNESS 07/20/2009  . Diabetes mellitus type 2 with complications, uncontrolled ([place]) 03/22/2009  . HYPERLIPIDEMIA 03/22/2009  . DEPRESSIVE DISORDER 03/22/2009    Past Surgical History:  Procedure Laterality Date  . CATARACT EXTRACTION W/PHACO Left 05/19/2016   Procedure: CATARACT EXTRACTION PHACO AND INTRAOCULAR LENS PLACEMENT LEFT EYE;  Surgeon: Tonny Branch, MD;  Location: [facility];  Service: Ophthalmology;  Laterality: Left;  CDE: 7.30  . CATARACT EXTRACTION W/PHACO Right 06/23/2016   Procedure: CATARACT EXTRACTION PHACO AND INTRAOCULAR LENS PLACEMENT RIGHT EYE CDE=9.87;  Surgeon: Tonny Branch, MD;  Location: [facility];  Service: Ophthalmology;  Laterality: Right;  right  . ESOPHAGOGASTRODUODENOSCOPY  2015   Dr. Britta Mccreedy  . ESOPHAGOGASTRODUODENOSCOPY N/A 10/26/2014   RMR: Distal esophagititis-likely reflux related although an element of pill induced injuruy no exclueded  status post biopsy. Diffusely abnormal gastric mucosa of uncertain signigicane -status post gastric biopsy. Focal area of excoriation in the cardia most consistant with a trauma of heaving.   [name] EYE SURGERY  2015   stent placed to left eye  . EYE SURGERY         Home Medications    Prior to Admission medications   Medication Sig Start Date End Date Taking? Authorizing Provider  amLODipine (NORVASC) 5 MG tablet Take 5 mg by mouth daily.  [provider]  atorvastatin (LIPITOR) 10 MG tablet Take 10 mg by mouth daily.    [provider]  brimonidine (ALPHAGAN) 0.15 % ophthalmic solution Place 1 drop into both eyes 2 (two) times daily.     [provider]  cetirizine (ZYRTEC) 10 MG tablet Take 10 mg by mouth daily.    [provider]  ciprofloxacin (CIPRO) 500 MG tablet Take 1 tablet (500 mg total) by mouth 2 (two) times daily.  05/25/17   [place] Callas, NP  cyclobenzaprine (FLEXERIL) 10 MG tablet Take 10 mg by mouth at bedtime as needed for muscle spasms.     [provider]  Delafloxacin Meglumine (BAXDELA) 450 MG TABS Take 1 tablet by mouth 2 (two) times daily. 05/06/17   Edrick Kins, DPM  dicyclomine (BENTYL) 10 MG capsule Take 1 capsule (10 mg total) by mouth 4 (four) times daily -  before meals and at bedtime. Patient taking differently: Take 10 mg by mouth 2 (two) times daily.  12/26/16   Annitta Needs, NP  doxycycline (VIBRA-TABS) 100 MG tablet Take 1 tablet (100 mg total) by mouth 2 (two) times daily. 05/25/17   [place] Callas, NP  gabapentin (NEURONTIN) 600 MG tablet Take 600 mg by mouth 4 (four) times daily as needed (for neuropathy).     [provider]  hydrochlorothiazide (HYDRODIURIL) 25 MG tablet Take 25 mg by mouth daily.    [provider]  insulin detemir (LEVEMIR) 100 UNIT/ML injection Inject 20 Units into the skin at bedtime as needed.     [provider]  insulin NPH-regular Human (NOVOLIN 70/30) (70-30) 100 UNIT/ML injection Inject 35 Units into the skin 2 (two) times daily.    [provider]  lisinopril (PRINIVIL,ZESTRIL) 40 MG tablet Take 40 mg by mouth daily.    [provider]  LORazepam (ATIVAN) 0.5 MG tablet Take 0.5 mg by mouth at bedtime as needed for anxiety.    [provider]  metoCLOPramide (REGLAN) 10 MG tablet Take 1 tablet (10 mg total) by mouth every 6 (six) hours as needed for nausea (nausea/headache). 02/21/17   Milton Ferguson, MD  pantoprazole (PROTONIX) 40 MG tablet TAKE 1 TABLET BY MOUTH TWICE DAILY BEFORE  A  MEAL 03/23/17   Mahala Menghini, PA-C  sitaGLIPtin (JANUVIA) 100 MG tablet Take 100 mg by mouth daily.    [provider]  sucralfate (CARAFATE) 1 g tablet TAKE ONE TABLET BY MOUTH 4 TIMES DAILY 03/12/16   Annitta Needs, NP  timolol (BETIMOL) 0.5 % ophthalmic solution Place 1 drop into both eyes 2  (two) times daily.    [provider]    Family History Family History  Problem Relation Age of Onset  . Ovarian cancer Mother   . Cervical cancer Sister   . Diabetes Sister   . Colon cancer Neg Hx     Social History Social History  Substance Use Topics  . Smoking status: Never Smoker  . Smokeless tobacco: Never Used  . Alcohol use No     Allergies   Donnatal [pb-hyoscy-atropine-scopol er]; Fish allergy; Haldol [haloperidol lactate]; Lidocaine; Maalox [calcium carbonate antacid]; Aspirin; Bactrim [sulfamethoxazole-trimethoprim]; Penicillins; and Phenergan [promethazine hcl]   Review of Systems Review of Systems ROS: Statement: All systems negative except as marked or noted in the HPI; Constitutional: Negative for fever and chills. ; ; Eyes: Negative for eye pain, redness and discharge. ; ; ENMT: Negative for ear pain, hoarseness, nasal congestion, sinus  pressure and sore throat. ; ; Cardiovascular: Negative for chest pain, palpitations, diaphoresis, dyspnea and peripheral edema. ; ; Respiratory: Negative for cough, wheezing and stridor. ; ; Gastrointestinal: +N/V, abd pain. Negative for diarrhea, blood in stool, hematemesis, jaundice and rectal bleeding. . ; ; Genitourinary: Negative for dysuria, flank pain and hematuria. ; ; Musculoskeletal: Negative for back pain and neck pain. Negative for swelling and trauma.; ; Skin: Negative for pruritus, rash, abrasions, blisters, bruising and skin lesion.; ; Neuro: Negative for headache, lightheadedness and neck stiffness. Negative for weakness, altered level of consciousness, altered mental status, extremity weakness, paresthesias, involuntary movement, seizure and syncope.       Physical Exam Updated Vital Signs BP (!) 193/103 (BP Location: Left Arm)   Pulse 76   Temp 98.4 F (36.9 C) (Oral)   Resp 20   Ht 5\' 9"  (1.753 m)   Wt (!) 137.9 kg (304 lb)   SpO2 100%   BMI 44.89 kg/m   Physical Exam 1215: Physical  examination:  Nursing notes reviewed; Vital signs and O2 SAT reviewed;  Constitutional: Well developed, Well nourished, Well hydrated, Uncomfortable appearing.; Head:  Normocephalic, atraumatic; Eyes: EOMI, PERRL, No scleral icterus; ENMT: Mouth and pharynx normal, Mucous membranes moist; Neck: Supple, Full range of motion, No lymphadenopathy; Cardiovascular: Regular rate and rhythm, No gallop; Respiratory: Breath sounds clear & equal bilaterally, No wheezes.  Speaking full sentences with ease, Normal respiratory effort/excursion; Chest: Nontender, Movement normal; Abdomen: Soft, +mid-epigastric tenderness to palp. No rebound or guarding. Nondistended, Normal bowel sounds; Genitourinary: No CVA tenderness; Extremities: Pulses normal, +post-op shoe right foot, DSD in place. No deformity. No calf edema or asymmetry.; Neuro: AA&Ox3, Major CN grossly intact.  Speech clear. No gross focal motor or sensory deficits in extremities.; Skin: Color normal, Warm, Dry.   ED Treatments / Results  Labs (all labs ordered are listed, but only abnormal results are displayed)   EKG  EKG Interpretation  Date/Time:  Wednesday June 10 2017 11:43:05 EDT Ventricular Rate:  80 PR Interval:    QRS Duration: 76 QT Interval:  388 QTC Calculation: 442 R Axis:   29 Text Interpretation:  Sinus rhythm Ventricular premature complex Low voltage, precordial leads Baseline wander Artifact When compared with ECG of 02/10/2017 PVC is present Confirmed by Francine Graven [phone]) on 06/10/2017 12:21:53 PM       Radiology   Procedures Procedures (including critical care time)  Medications Ordered in ED Medications  fentaNYL (SUBLIMAZE) injection 50 mcg (50 mcg Intravenous Given 06/10/17 1456)  ondansetron (ZOFRAN) injection 4 mg (4 mg Intravenous Given 06/10/17 1605)  sodium chloride 0.9 % bolus 1,000 mL (0 mLs Intravenous Stopped 06/10/17 1557)  metoCLOPramide (REGLAN) injection 10 mg (10 mg Intravenous Given 06/10/17  1238)  famotidine (PEPCID) IVPB 20 mg premix (0 mg Intravenous Stopped 06/10/17 1313)  dicyclomine (BENTYL) injection 20 mg (20 mg Intramuscular Given 06/10/17 1245)  LORazepam (ATIVAN) injection 0.5 mg (0.5 mg Intravenous Given 06/10/17 1311)  diphenhydrAMINE (BENADRYL) injection 50 mg (50 mg Intravenous Given 06/10/17 1605)     Initial Impression / Assessment and Plan / ED Course  I have reviewed the triage vital signs and the nursing notes.  Pertinent labs & imaging results that were available during my care of the patient were reviewed by me and considered in my medical decision making (see chart for details).  MDM Reviewed: previous chart, nursing note and vitals Reviewed previous: labs and ECG Interpretation: labs, ECG and x-ray   Results for orders placed or  performed during the hospital encounter of 06/10/17  Lipase, blood  Result Value Ref Range   Lipase 26 11 - 51 U/L  Comprehensive metabolic panel  Result Value Ref Range   Sodium 135 135 - 145 mmol/L   Potassium 3.8 3.5 - 5.1 mmol/L   Chloride 101 101 - 111 mmol/L   CO2 24 22 - 32 mmol/L   Glucose, Bld 263 (H) 65 - 99 mg/dL   BUN 17 6 - 20 mg/dL   Creatinine, Ser 1.33 (H) 0.61 - 1.24 mg/dL   Calcium 9.1 8.9 - 10.3 mg/dL   Total Protein 7.6 6.5 - 8.1 g/dL   Albumin 3.8 3.5 - 5.0 g/dL   AST 23 15 - 41 U/L   ALT 26 17 - 63 U/L   Alkaline Phosphatase 70 38 - 126 U/L   Total Bilirubin 1.1 0.3 - 1.2 mg/dL   GFR calc non Af Amer >60 >60 mL/min   GFR calc Af Amer >60 >60 mL/min   Anion gap 10 5 - 15  CBC  Result Value Ref Range   WBC 9.1 4.0 - 10.5 K/uL   RBC 4.02 (L) 4.22 - 5.81 MIL/uL   Hemoglobin 12.4 (L) 13.0 - 17.0 g/dL   HCT 35.3 (L) 39.0 - 52.0 %   MCV 87.8 78.0 - 100.0 fL   MCH 30.8 26.0 - 34.0 pg   MCHC 35.1 30.0 - 36.0 g/dL   RDW 12.6 11.5 - 15.5 %   Platelets 140 (L) 150 - 400 K/uL  Urinalysis, Routine w reflex microscopic  Result Value Ref Range   Color, Urine YELLOW YELLOW   APPearance CLEAR  CLEAR   Specific Gravity, Urine 1.015 1.005 - 1.030   pH 7.0 5.0 - 8.0   Glucose, UA 150 (A) NEGATIVE mg/dL   Hgb urine dipstick SMALL (A) NEGATIVE   Bilirubin Urine NEGATIVE NEGATIVE   Ketones, ur NEGATIVE NEGATIVE mg/dL   Protein, ur >=300 (A) NEGATIVE mg/dL   Nitrite NEGATIVE NEGATIVE   Leukocytes, UA NEGATIVE NEGATIVE   RBC / HPF 6-30 0 - 5 RBC/hpf   WBC, UA 0-5 0 - 5 WBC/hpf   Bacteria, UA NONE SEEN NONE SEEN   Squamous Epithelial / LPF NONE SEEN NONE SEEN  Troponin I  Result Value Ref Range   Troponin I <0.03 <0.03 ng/mL   Dg Abd Acute W/chest Result Date: 06/10/2017 CLINICAL DATA:  46 year old male with nausea vomiting and abdominal pain since yesterday. EXAM: DG ABDOMEN ACUTE W/ 1V CHEST COMPARISON:  02/10/2017 and earlier. FINDINGS: Seated upright AP view of the chest. Lordotic positioning. Mediastinal contours and lung parenchyma suspected to be stable. Visualized tracheal air column is within normal limits. No pneumoperitoneum identified. Non obstructed bowel gas pattern. Abdominal and pelvic visceral contours are within normal limits. No acute osseous abnormality identified. Chronic right lateral L4-L5 endplate degeneration. IMPRESSION: 1.  Normal bowel gas pattern, no free air. 2.  No acute cardiopulmonary abnormality. Electronically Signed   By: Genevie Ann M.D.   On: 06/10/2017 15:36    1610:  Pt given multiple doses of meds without sustained improvement in N/V. Will admit. T/C to [facility] Dr. Adair Patter, case discussed, including:  HPI, pertinent PM/SHx, VS/PE, dx testing, ED course and treatment:  Agreeable to admit.     Final Clinical Impressions(s) / ED Diagnoses   Final diagnoses:  None    New Prescriptions New Prescriptions   No medications on file     Francine Graven, DO 06/14/17 1335

## 2017-06-11 DIAGNOSIS — L97414 Non-pressure chronic ulcer of right heel and midfoot with necrosis of bone: Secondary | ICD-10-CM | POA: Diagnosis not present

## 2017-06-11 DIAGNOSIS — Z79899 Other long term (current) drug therapy: Secondary | ICD-10-CM | POA: Diagnosis not present

## 2017-06-11 DIAGNOSIS — R112 Nausea with vomiting, unspecified: Secondary | ICD-10-CM | POA: Diagnosis not present

## 2017-06-11 DIAGNOSIS — Z961 Presence of intraocular lens: Secondary | ICD-10-CM | POA: Diagnosis present

## 2017-06-11 DIAGNOSIS — K219 Gastro-esophageal reflux disease without esophagitis: Secondary | ICD-10-CM | POA: Diagnosis present

## 2017-06-11 DIAGNOSIS — D638 Anemia in other chronic diseases classified elsewhere: Secondary | ICD-10-CM | POA: Diagnosis present

## 2017-06-11 DIAGNOSIS — Z6841 Body Mass Index (BMI) 40.0 and over, adult: Secondary | ICD-10-CM | POA: Diagnosis not present

## 2017-06-11 DIAGNOSIS — F329 Major depressive disorder, single episode, unspecified: Secondary | ICD-10-CM | POA: Diagnosis present

## 2017-06-11 DIAGNOSIS — I739 Peripheral vascular disease, unspecified: Secondary | ICD-10-CM | POA: Diagnosis not present

## 2017-06-11 DIAGNOSIS — E785 Hyperlipidemia, unspecified: Secondary | ICD-10-CM | POA: Diagnosis present

## 2017-06-11 DIAGNOSIS — R066 Hiccough: Secondary | ICD-10-CM | POA: Diagnosis present

## 2017-06-11 DIAGNOSIS — Z833 Family history of diabetes mellitus: Secondary | ICD-10-CM | POA: Diagnosis not present

## 2017-06-11 DIAGNOSIS — E1169 Type 2 diabetes mellitus with other specified complication: Secondary | ICD-10-CM | POA: Diagnosis present

## 2017-06-11 DIAGNOSIS — E1151 Type 2 diabetes mellitus with diabetic peripheral angiopathy without gangrene: Secondary | ICD-10-CM | POA: Diagnosis present

## 2017-06-11 DIAGNOSIS — E1143 Type 2 diabetes mellitus with diabetic autonomic (poly)neuropathy: Secondary | ICD-10-CM | POA: Diagnosis not present

## 2017-06-11 DIAGNOSIS — E1165 Type 2 diabetes mellitus with hyperglycemia: Secondary | ICD-10-CM | POA: Diagnosis not present

## 2017-06-11 DIAGNOSIS — E118 Type 2 diabetes mellitus with unspecified complications: Secondary | ICD-10-CM | POA: Diagnosis not present

## 2017-06-11 DIAGNOSIS — N179 Acute kidney failure, unspecified: Secondary | ICD-10-CM | POA: Diagnosis not present

## 2017-06-11 DIAGNOSIS — E876 Hypokalemia: Secondary | ICD-10-CM | POA: Diagnosis present

## 2017-06-11 DIAGNOSIS — M869 Osteomyelitis, unspecified: Secondary | ICD-10-CM | POA: Diagnosis present

## 2017-06-11 DIAGNOSIS — D696 Thrombocytopenia, unspecified: Secondary | ICD-10-CM | POA: Diagnosis present

## 2017-06-11 DIAGNOSIS — L97509 Non-pressure chronic ulcer of other part of unspecified foot with unspecified severity: Secondary | ICD-10-CM | POA: Diagnosis present

## 2017-06-11 DIAGNOSIS — Z794 Long term (current) use of insulin: Secondary | ICD-10-CM | POA: Diagnosis not present

## 2017-06-11 DIAGNOSIS — H409 Unspecified glaucoma: Secondary | ICD-10-CM | POA: Diagnosis present

## 2017-06-11 DIAGNOSIS — I10 Essential (primary) hypertension: Secondary | ICD-10-CM | POA: Diagnosis not present

## 2017-06-11 DIAGNOSIS — E11621 Type 2 diabetes mellitus with foot ulcer: Secondary | ICD-10-CM | POA: Diagnosis present

## 2017-06-11 DIAGNOSIS — K3184 Gastroparesis: Secondary | ICD-10-CM | POA: Diagnosis not present

## 2017-06-11 LAB — BASIC METABOLIC PANEL
Anion gap: 7 (ref 5–15)
BUN: 21 mg/dL — ABNORMAL HIGH (ref 6–20)
CO2: 26 mmol/L (ref 22–32)
Calcium: 8.3 mg/dL — ABNORMAL LOW (ref 8.9–10.3)
Chloride: 102 mmol/L (ref 101–111)
Creatinine, Ser: 1.62 mg/dL — ABNORMAL HIGH (ref 0.61–1.24)
GFR calc Af Amer: 57 mL/min — ABNORMAL LOW (ref >60)
GFR calc non Af Amer: 49 mL/min — ABNORMAL LOW (ref >60)
Glucose, Bld: 194 mg/dL — ABNORMAL HIGH (ref 65–99)
Potassium: 3.4 mmol/L — ABNORMAL LOW (ref 3.5–5.1)
Sodium: 135 mmol/L (ref 135–145)

## 2017-06-11 LAB — GLUCOSE, CAPILLARY
Glucose-Capillary: 220 mg/dL — ABNORMAL HIGH (ref 65–99)
Glucose-Capillary: 255 mg/dL — ABNORMAL HIGH (ref 65–99)
Glucose-Capillary: 181 mg/dL — ABNORMAL HIGH (ref 65–99)
Glucose-Capillary: 168 mg/dL — ABNORMAL HIGH (ref 65–99)
Glucose-Capillary: 156 mg/dL — ABNORMAL HIGH (ref 65–99)

## 2017-06-11 LAB — TROPONIN I
Troponin I: <0.03 ng/mL (ref <0.03)
Troponin I: <0.03 ng/mL (ref <0.03)

## 2017-06-11 LAB — HEMOGLOBIN A1C
Hgb A1c MFr Bld: 6.7 % — ABNORMAL HIGH (ref 4.8–5.6)
Mean Plasma Glucose: 145.59 mg/dL

## 2017-06-11 MED ORDER — ATORVASTATIN CALCIUM 10 MG PO TABS
10.0000 mg | ORAL_TABLET | Freq: Every day | ORAL | Status: DC
  Administered 2017-06-11 – 2017-06-16 (×6): 10 mg via ORAL
  Filled 2017-06-11 (×6): qty 1

## 2017-06-11 MED ORDER — LISINOPRIL 10 MG PO TABS
40.0000 mg | ORAL_TABLET | Freq: Every day | ORAL | Status: DC
  Administered 2017-06-11 – 2017-06-15 (×5): 40 mg via ORAL
  Filled 2017-06-11 (×5): qty 4

## 2017-06-11 MED ORDER — INSULIN ASPART 100 UNIT/ML ~~LOC~~ SOLN
0.0000 [IU] | SUBCUTANEOUS | Status: DC
  Administered 2017-06-11 (×2): 2 [IU] via SUBCUTANEOUS
  Administered 2017-06-12: 1 [IU] via SUBCUTANEOUS
  Administered 2017-06-12: 5 [IU] via SUBCUTANEOUS
  Administered 2017-06-12: 1 [IU] via SUBCUTANEOUS
  Administered 2017-06-12: 5 [IU] via SUBCUTANEOUS
  Administered 2017-06-12: 3 [IU] via SUBCUTANEOUS
  Administered 2017-06-12: 2 [IU] via SUBCUTANEOUS
  Administered 2017-06-13 (×3): 3 [IU] via SUBCUTANEOUS
  Administered 2017-06-13: 4 [IU] via SUBCUTANEOUS
  Administered 2017-06-14 (×2): 3 [IU] via SUBCUTANEOUS

## 2017-06-11 MED ORDER — SERTRALINE HCL 50 MG PO TABS
100.0000 mg | ORAL_TABLET | Freq: Every day | ORAL | Status: DC
  Administered 2017-06-11 – 2017-06-16 (×6): 100 mg via ORAL
  Filled 2017-06-11 (×6): qty 2

## 2017-06-11 MED ORDER — FENTANYL CITRATE (PF) 100 MCG/2ML IJ SOLN
50.0000 ug | INTRAMUSCULAR | Status: AC | PRN
  Administered 2017-06-12 (×2): 50 ug via INTRAVENOUS
  Filled 2017-06-11 (×2): qty 2

## 2017-06-11 MED ORDER — DOXYCYCLINE HYCLATE 100 MG IV SOLR
INTRAVENOUS | Status: AC
  Filled 2017-06-11: qty 100

## 2017-06-11 MED ORDER — HYDROCHLOROTHIAZIDE 25 MG PO TABS
25.0000 mg | ORAL_TABLET | Freq: Every day | ORAL | Status: DC
  Administered 2017-06-11 – 2017-06-13 (×3): 25 mg via ORAL
  Filled 2017-06-11 (×3): qty 1

## 2017-06-11 MED ORDER — SUCRALFATE 1 G PO TABS
1.0000 g | ORAL_TABLET | Freq: Four times a day (QID) | ORAL | Status: DC
  Administered 2017-06-11 – 2017-06-16 (×20): 1 g via ORAL
  Filled 2017-06-11 (×21): qty 1

## 2017-06-11 MED ORDER — SODIUM CHLORIDE 0.9 % IV SOLN
INTRAVENOUS | Status: AC
  Administered 2017-06-11: 17:00:00 via INTRAVENOUS

## 2017-06-11 MED ORDER — DICYCLOMINE HCL 10 MG/ML IM SOLN
10.0000 mg | Freq: Three times a day (TID) | INTRAMUSCULAR | Status: DC
  Administered 2017-06-11 – 2017-06-13 (×6): 10 mg via INTRAMUSCULAR
  Filled 2017-06-11 (×12): qty 1

## 2017-06-11 NOTE — Care Management Note (Signed)
Case Management Note  Patient Details  Name: Timothy Clark MRN: [id number] Date of Birth: 07-11-1971  Subjective/Objective:       admitted with intractable N/V. Pt from home, recently DC'd from hospital for osteomylitis. Taking PO abx and going to OP wound center for hyperbaric treatment of his wound. Pt reports he is getting HH through [facility] but after CM clarified with HH rep, pt was not admitted to their services d/t him receiving OP therapy. Pt staying with his brother for the past two weeks, in [place]. Pt reports he is almost completely blind at baseline but is ind with ADL's and with mobility, he has a boot he wears to protect his wound. Pt reports he is completely blind this AM. Before entering room pt is lying quietly in the bed, after introducing myself pt begins to retch and complain of SOB. RN raised the head of his bed and notified the RN. Pt resting comfortably a few minutes later.               Action/Plan: Anticipate DC home with resumption of OP services. CM will cont to follow for DC planning needs.   Expected Discharge Date:     06/12/2017             Expected Discharge Plan:  Home/Self Care  In-House Referral:  NA  Discharge planning Services  CM Consult  Post Acute Care Choice:  NA Choice offered to:  NA  Status of Service:  In process, will continue to follow  Sherald Barge, RN 06/11/2017, 11:20 AM

## 2017-06-11 NOTE — Progress Notes (Signed)
Pharmacy Antibiotic Note  Timothy Clark is a 46 y.o. male admitted on 06/10/2017 with osteomyelitis.  Pharmacy has been consulted for Cipro dosing.  Plan: Cipro 400mg  IV every 12 hours. Monitor labs, micro and vitals.  Dose stable for age, weight, renal function and indication. No pharmacokinetic monitoring needed. Sign off.   Height: 5\' 9"  (175.3 cm) Weight: 294 lb 12.1 oz (133.7 kg) IBW/kg (Calculated) : 70.7  Temp (24hrs), Avg:99.1 F (37.3 C), Min:98.7 F (37.1 C), Max:99.5 F (37.5 C)   Recent Labs Lab 06/10/17 1145 06/11/17 0601  WBC 9.1  --   CREATININE 1.33* 1.62*    Estimated Creatinine Clearance: 76.5 mL/min (A) (by C-G formula based on SCr of 1.62 mg/dL (H)).    Allergies  Allergen Reactions  . Donnatal [Pb-Hyoscy-Atropine-Scopol Er] Anaphylaxis    Reaction to GI cocktail  . Fish Allergy Anaphylaxis, Hives and Rash  . Haldol [Haloperidol Lactate] Other (See Comments)    Chest Pain  . Lidocaine Anaphylaxis    Reaction to GI cocktail  . Maalox [Calcium Carbonate Antacid] Anaphylaxis    Reaction to GI cocktail  . Aspirin Hives and Itching  . Bactrim [Sulfamethoxazole-Trimethoprim] Itching  . Penicillins Hives and Itching    Has patient had a PCN reaction causing immediate rash, facial/tongue/throat swelling, SOB or lightheadedness with hypotension: yes Has patient had a PCN reaction causing severe rash involving mucus membranes or skin necrosis: yes Has patient had a PCN reaction that required hospitalization no Has patient had a PCN reaction occurring within the last 10 years: yes If all of the above answers are "NO", then may proceed with Cephalospor  . Phenergan [Promethazine Hcl] Nausea And Vomiting    Antimicrobials this admission: Cipro 10/31 >>  Doxy 10/31 >>   Dose adjustments this admission: n/a   Microbiology results:  BCx:   UCx:    Sputum:    10/31 MRSA PCR: (-)  Thank you for allowing pharmacy to be a part of this patient's  care.  Pricilla Clark 06/11/2017 11:54 AM

## 2017-06-11 NOTE — Progress Notes (Signed)
PROGRESS NOTE    Timothy Clark  PXT:[id number] DOB: 1971-05-01 DOA: 06/10/2017 PCP: Tawni Carnes, PA-C    Brief Narrative:  Timothy Clark is a 46 y.o. male with medical history significant of gastritis, gastroparesis, GERD, Glaucoma, HTN, Type II DM with complications that presents with nausea and vomiting for which patient has a history of.  Per patient nausea and vomiting began yesterday.  Having cramping abdominal pain.  Unable to take usual medications due to his nausea and vomiting.  Has had 5 ED visits in the past 6 months for these symptoms.  Denies fevers, chills, chest pressure, shortness of breath.  Patient reports that he has had about 10 episodes of emesis since yesterday.  Denies blood in emesis.  Patient reports chest pain that feels like a cramp that is accompanying dry heaves and vomiting.  States emesis at this time is mostly mucuous and is mostly just dry heaving.  Denies any sick contacts.    ED Course: Seen by Dr. Thurnell Garbe who ordered troponin (<0.03), UA (with small Hgb, >300 glucose, negative LE and nitrite), Cr of 1.33 (around baseline).  Was given reglan, benadryl, zofran, ativan, bentyl, pepcid, fentanyl without relief.  [facility] asked to admit for intractable nausea and vomiting.   Review of Systems: As per HPI otherwise 10 point review of systems negative.    Assessment & Plan:   Principal Problem:   Intractable nausea and vomiting Active Problems:   Diabetes mellitus type 2 with complications, uncontrolled (HCC)   UNSPECIFIED PERIPHERAL VASCULAR DISEASE   DIABETIC FOOT ULCER with Osteomyelitis   Essential hypertension   Gastroparesis due to DM (HCC)   AKI (acute kidney injury) (HCC)   Intractable nausea and vomiting - IVF of NS at 131ml/hr for 15 hours - IV zofran, pepcid, reglan - NPO - fentanyl for pain - ativan for anxiety - increased creatinine this am, will repeat IVF resuscitation and BMP in am  Chest pain - troponins negative x  3  Osteomyelitis - continue cipro and flagyl per pharm IV   Diabetes mellitus Type II - SSI  Diabetic foot ulcer with osteo - continue ciprofloxacin and doxycycline  Essential HTN - IV hydralazine for SBP >180 - restart home meds  AKI - Cr slightly increased this am - repeat BMP in am    DVT prophylaxis: lovenox, SCDs Code Status:  Full Code  Family Communication:  No family bedside  Disposition Plan: likely home when improved     Consultants:   none  Procedures:   none  Antimicrobials:   cipro and doxy per outpatient regimen    Subjective: Patient says he felt better sporadically throughout the night. Now having a panic attack.   Objective: Vitals:   06/11/17 0419 06/11/17 0420 06/11/17 0716 06/11/17 1532  BP:  (!) 158/89  (!) 143/75  Pulse:  85  77  Resp:  12  18  Temp: 98.7 F (37.1 C)  99 F (37.2 C) 98 F (36.7 C)  TempSrc: Oral  Oral   SpO2:  95%  97%  Weight: 133.7 kg (294 lb 12.1 oz)     Height:        Intake/Output Summary (Last 24 hours) at 06/11/17 1559 Last data filed at 06/11/17 1100  Gross per 24 hour  Intake             1700 ml  Output              450 ml  Net  1250 ml   Filed Weights   06/10/17 1137 06/10/17 1722 06/11/17 0419  Weight: (!) 137.9 kg (304 lb) 133.9 kg (295 lb 3.1 oz) 133.7 kg (294 lb 12.1 oz)    Examination:  General exam: mild distress Respiratory system: Clear to auscultation. Respiratory effort normal. Cardiovascular system: S1 & S2 heard, RRR. No JVD, murmurs, rubs, gallops or clicks. No pedal edema. Gastrointestinal system: Abdomen is nondistended, soft and nontender. No organomegaly or masses felt. Normal bowel sounds heard. Central nervous system: Alert and oriented.  Extremities: Symmetric 5 x 5 power. Skin: No rashes, lesions or ulcers Psychiatry: Mood & affect appropriate.     Data Reviewed: I have personally reviewed following labs and imaging studies  CBC:  Recent  Labs Lab 06/10/17 1145  WBC 9.1  HGB 12.4*  HCT 35.3*  MCV 87.8  PLT 277*   Basic Metabolic Panel:  Recent Labs Lab 06/10/17 1145 06/11/17 0601  NA 135 135  K 3.8 3.4*  CL 101 102  CO2 24 26  GLUCOSE 263* 194*  BUN 17 21*  CREATININE 1.33* 1.62*  CALCIUM 9.1 8.3*   GFR: Estimated Creatinine Clearance: 76.5 mL/min (A) (by C-G formula based on SCr of 1.62 mg/dL (H)). Liver Function Tests:  Recent Labs Lab 06/10/17 1145  AST 23  ALT 26  ALKPHOS 70  BILITOT 1.1  PROT 7.6  ALBUMIN 3.8    Recent Labs Lab 06/10/17 1145  LIPASE 26   No results for input(s): AMMONIA in the last 168 hours. Coagulation Profile: No results for input(s): INR, PROTIME in the last 168 hours. Cardiac Enzymes:  Recent Labs Lab 06/10/17 1308 06/10/17 1817 06/10/17 2353 06/11/17 0601  TROPONINI <0.03 <0.03 <0.03 <0.03   BNP (last 3 results) No results for input(s): PROBNP in the last 8760 hours. HbA1C:  Recent Labs  06/10/17 1147  HGBA1C 6.7*   CBG:  Recent Labs Lab 06/10/17 2102 06/11/17 0715 06/11/17 1151  GLUCAP 222* 220* 255*   Lipid Profile: No results for input(s): CHOL, HDL, LDLCALC, TRIG, CHOLHDL, LDLDIRECT in the last 72 hours. Thyroid Function Tests: No results for input(s): TSH, T4TOTAL, FREET4, T3FREE, THYROIDAB in the last 72 hours. Anemia Panel: No results for input(s): VITAMINB12, FOLATE, FERRITIN, TIBC, IRON, RETICCTPCT in the last 72 hours. Sepsis Labs: No results for input(s): PROCALCITON, LATICACIDVEN in the last 168 hours.  Recent Results (from the past 240 hour(s))  MRSA PCR Screening     Status: None   Collection Time: 06/10/17  5:27 PM  Result Value Ref Range Status   MRSA by PCR NEGATIVE NEGATIVE Final    Comment:        The GeneXpert MRSA Assay (FDA approved for NASAL specimens only), is one component of a comprehensive MRSA colonization surveillance program. It is not intended to diagnose MRSA infection nor to guide or monitor  treatment for MRSA infections.          Radiology Studies: Dg Abd Acute W/chest  Result Date: 06/10/2017 CLINICAL DATA:  46 year old male with nausea vomiting and abdominal pain since yesterday. EXAM: DG ABDOMEN ACUTE W/ 1V CHEST COMPARISON:  02/10/2017 and earlier. FINDINGS: Seated upright AP view of the chest. Lordotic positioning. Mediastinal contours and lung parenchyma suspected to be stable. Visualized tracheal air column is within normal limits. No pneumoperitoneum identified. Non obstructed bowel gas pattern. Abdominal and pelvic visceral contours are within normal limits. No acute osseous abnormality identified. Chronic right lateral L4-L5 endplate degeneration. IMPRESSION: 1.  Normal bowel gas pattern, no  free air. 2.  No acute cardiopulmonary abnormality. Electronically Signed   By: Genevie Ann M.D.   On: 06/10/2017 15:36        Scheduled Meds: . dicyclomine  10 mg Intramuscular TID  . enoxaparin (LOVENOX) injection  40 mg Subcutaneous Q24H  . insulin aspart  0-9 Units Subcutaneous TID WC  . pantoprazole (PROTONIX) IV  40 mg Intravenous QHS   Continuous Infusions: . ciprofloxacin Stopped (06/11/17 1016)  . doxycycline (VIBRAMYCIN) IV Stopped (06/11/17 0917)     LOS: 0 days    Time spent: 30 minutes    Loretha Stapler, MD [facility] Pager [phone]  If 7PM-7AM, please contact night-coverage [email or web address] Password [email or web address] 06/11/2017, 3:59 PM

## 2017-06-11 NOTE — Care Management Obs Status (Signed)
Welcome NOTIFICATION   Patient Details  Name: Timothy Clark MRN: [id number] Date of Birth: [birth year]   Medicare Observation Status Notification Given:  Yes    Sherald Barge, RN 06/11/2017, 10:29 AM

## 2017-06-11 NOTE — Progress Notes (Signed)
Inpatient Diabetes Program Recommendations  AACE/ADA: New Consensus Statement on Inpatient Glycemic Control (2015)  Target Ranges:  Prepandial:   less than 140 mg/dL      Peak postprandial:   less than 180 mg/dL (1-2 hours)      Critically ill patients:  140 - 180 mg/dL   Results for LARK, RUNK (MRN [id number]) as of 06/11/2017 10:41  Ref. Range 06/10/2017 21:02 06/11/2017 07:15  Glucose-Capillary Latest Ref Range: 65 - 99 mg/dL 222 (H) 220 (H)   Review of Glycemic Control  Diabetes history: DM2 Outpatient Diabetes medications: Levemir 20 units QHS, 70/30 35 units BID, Januvia 100 mg daily Current orders for Inpatient glycemic control: Novolog 0-9 units TID with meals  Inpatient Diabetes Program Recommendations:  Insulin - Basal: Please consider ordering Levemir 10 units Q24H. Correction (SSI): If patient will remain NPO, please consider changing frequency of CBGs and Novolog correction to Q4H. HgbA1C: A1C in process.  Thanks, Barnie Alderman, RN, MSN, CDE Diabetes Coordinator Inpatient Diabetes Program [phone] (Team Pager from 8am to 5pm)

## 2017-06-12 ENCOUNTER — Inpatient Hospital Stay (HOSPITAL_COMMUNITY): Payer: Medicare Other

## 2017-06-12 LAB — GLUCOSE, CAPILLARY
Glucose-Capillary: 137 mg/dL — ABNORMAL HIGH (ref 65–99)
Glucose-Capillary: 146 mg/dL — ABNORMAL HIGH (ref 65–99)
Glucose-Capillary: 194 mg/dL — ABNORMAL HIGH (ref 65–99)
Glucose-Capillary: 228 mg/dL — ABNORMAL HIGH (ref 65–99)
Glucose-Capillary: 267 mg/dL — ABNORMAL HIGH (ref 65–99)
Glucose-Capillary: 296 mg/dL — ABNORMAL HIGH (ref 65–99)

## 2017-06-12 LAB — TROPONIN I
Troponin I: <0.03 ng/mL (ref <0.03)
Troponin I: <0.03 ng/mL (ref <0.03)
Troponin I: <0.03 ng/mL (ref <0.03)

## 2017-06-12 LAB — BASIC METABOLIC PANEL
Anion gap: 10 (ref 5–15)
BUN: 18 mg/dL (ref 6–20)
CO2: 26 mmol/L (ref 22–32)
Calcium: 8.5 mg/dL — ABNORMAL LOW (ref 8.9–10.3)
Chloride: 100 mmol/L — ABNORMAL LOW (ref 101–111)
Creatinine, Ser: 1.42 mg/dL — ABNORMAL HIGH (ref 0.61–1.24)
GFR calc Af Amer: >60 mL/min (ref >60)
GFR calc non Af Amer: 58 mL/min — ABNORMAL LOW (ref >60)
Glucose, Bld: 169 mg/dL — ABNORMAL HIGH (ref 65–99)
Potassium: 3.1 mmol/L — ABNORMAL LOW (ref 3.5–5.1)
Sodium: 136 mmol/L (ref 135–145)

## 2017-06-12 MED ORDER — CYCLOBENZAPRINE HCL 10 MG PO TABS
10.0000 mg | ORAL_TABLET | Freq: Every evening | ORAL | Status: DC | PRN
  Administered 2017-06-13: 10 mg via ORAL
  Filled 2017-06-12: qty 1

## 2017-06-12 MED ORDER — METOCLOPRAMIDE HCL 5 MG/ML IJ SOLN
10.0000 mg | Freq: Three times a day (TID) | INTRAMUSCULAR | Status: DC | PRN
  Administered 2017-06-12 – 2017-06-15 (×4): 10 mg via INTRAVENOUS
  Filled 2017-06-12 (×7): qty 2

## 2017-06-12 MED ORDER — ONDANSETRON HCL 4 MG/2ML IJ SOLN
4.0000 mg | Freq: Four times a day (QID) | INTRAMUSCULAR | Status: DC
  Administered 2017-06-13 – 2017-06-16 (×11): 4 mg via INTRAVENOUS
  Filled 2017-06-12 (×11): qty 2

## 2017-06-12 MED ORDER — INSULIN DETEMIR 100 UNIT/ML ~~LOC~~ SOLN
8.0000 [IU] | Freq: Every day | SUBCUTANEOUS | Status: DC
  Administered 2017-06-12 – 2017-06-14 (×3): 8 [IU] via SUBCUTANEOUS
  Filled 2017-06-12 (×4): qty 0.08

## 2017-06-12 MED ORDER — ONDANSETRON HCL 4 MG PO TABS
4.0000 mg | ORAL_TABLET | Freq: Four times a day (QID) | ORAL | Status: DC
  Administered 2017-06-12 – 2017-06-16 (×6): 4 mg via ORAL
  Filled 2017-06-12 (×8): qty 1

## 2017-06-12 MED ORDER — POTASSIUM CHLORIDE 10 MEQ/100ML IV SOLN
10.0000 meq | INTRAVENOUS | Status: AC
  Administered 2017-06-12 (×3): 10 meq via INTRAVENOUS
  Filled 2017-06-12 (×3): qty 100

## 2017-06-12 MED ORDER — OLANZAPINE 10 MG PO TBDP
10.0000 mg | ORAL_TABLET | Freq: Once | ORAL | Status: DC | PRN
  Filled 2017-06-12: qty 1

## 2017-06-12 NOTE — Progress Notes (Signed)
PROGRESS NOTE    Timothy Clark  TGG:[id number] DOB: 01/14/1971 DOA: 06/10/2017 PCP: Tawni Carnes, PA-C    Brief Narrative:  Timothy Clark is a 45 y.o. male with medical history significant of gastritis, gastroparesis, GERD, Glaucoma, HTN, Type II DM with complications that presents with nausea and vomiting for which patient has a history of.  Per patient nausea and vomiting began yesterday.  Having cramping abdominal pain.  Unable to take usual medications due to his nausea and vomiting.  Has had 5 ED visits in the past 6 months for these symptoms.  Denies fevers, chills, chest pressure, shortness of breath.  Patient reports that he has had about 10 episodes of emesis since yesterday.  Denies blood in emesis.  Patient reports chest pain that feels like a cramp that is accompanying dry heaves and vomiting.  States emesis at this time is mostly mucuous and is mostly just dry heaving.  Denies any sick contacts.    ED Course: Seen by Dr. Thurnell Garbe who ordered troponin (<0.03), UA (with small Hgb, >300 glucose, negative LE and nitrite), Cr of 1.33 (around baseline).  Was given reglan, benadryl, zofran, ativan, bentyl, pepcid, fentanyl without relief.  TRH asked to admit for intractable nausea and vomiting.   Review of Systems: As per HPI otherwise 10 point review of systems negative.    Assessment & Plan:   Principal Problem:   Intractable nausea and vomiting Active Problems:   Diabetes mellitus type 2 with complications, uncontrolled (HCC)   UNSPECIFIED PERIPHERAL VASCULAR DISEASE   DIABETIC FOOT ULCER with Osteomyelitis   Essential hypertension   Gastroparesis due to DM (HCC)   AKI (acute kidney injury) (HCC)   Intractable nausea and vomiting - IV zofran, pepcid, reglan - NPO - ativan for anxiety - creatinine better this am - will not be giving IV or PO diluadid as not clinically indicated - KUB  Chest pain - troponins negative x 3  Osteomyelitis - continue cipro and  doxy per pharm IV  Diabetes mellitus Type II - SSI - starting levemir 8units q24  Diabetic foot ulcer with osteo - continue ciprofloxacin and doxycycline  Essential HTN - IV hydralazine for SBP >180 - restart home meds  AKI - monitor renal function - improved Cr with fluid resuscitation   DVT prophylaxis: lovenox, SCDs Code Status:  Full Code  Family Communication:  No family bedside  Disposition Plan: likely home when improved   Consultants:   none  Procedures:   none  Antimicrobials:   cipro and doxy per outpatient regimen    Subjective: Patient initially asleep when provider entered room.  Upon provider introducing herself patient stated he was in pain and cramping all over and that he needed something for pain.  Then stated that previously he was given diluadid and that helped him.  No episodes of actual emesis seen since time of admission.   Objective: Vitals:   06/11/17 2023 06/11/17 2039 06/12/17 0407 06/12/17 0549  BP:  (!) 157/73 (!) 174/88 (!) 190/105  Pulse:  75 84 95  Resp:  16 14 (!) 22  Temp:  98.9 F (37.2 C) 98.7 F (37.1 C) 98.3 F (36.8 C)  TempSrc:  Oral Oral   SpO2: 98% 99% 99% 100%  Weight:      Height:        Intake/Output Summary (Last 24 hours) at 06/12/17 1616 Last data filed at 06/12/17 1200  Gross per 24 hour  Intake  2805 ml  Output             1125 ml  Net             1680 ml   Filed Weights   06/10/17 1137 06/10/17 1722 06/11/17 0419  Weight: (!) 137.9 kg (304 lb) 133.9 kg (295 lb 3.1 oz) 133.7 kg (294 lb 12.1 oz)    Examination:  General exam: mild distress, screaming in distress Respiratory system: Clear to auscultation. Respiratory effort normal. Cardiovascular system: S1 & S2 heard, RRR. No JVD, murmurs, rubs, gallops or clicks. No pedal edema. Gastrointestinal system: Abdomen is nondistended, soft and nontender. No organomegaly or masses felt. Normal bowel sounds heard. Central nervous system:  Alert and oriented.  Extremities: Symmetric 5 x 5 power. Skin: thickened skin on knees bilaterally, dry skin on bilateral lower extremities Psychiatry: distres    Data Reviewed: I have personally reviewed following labs and imaging studies  CBC:  Recent Labs Lab 06/10/17 1145  WBC 9.1  HGB 12.4*  HCT 35.3*  MCV 87.8  PLT 416*   Basic Metabolic Panel:  Recent Labs Lab 06/10/17 1145 06/11/17 0601 06/12/17 0449  NA 135 135 136  K 3.8 3.4* 3.1*  CL 101 102 100*  CO2 24 26 26   GLUCOSE 263* 194* 169*  BUN 17 21* 18  CREATININE 1.33* 1.62* 1.42*  CALCIUM 9.1 8.3* 8.5*   GFR: Estimated Creatinine Clearance: 87.2 mL/min (A) (by C-G formula based on SCr of 1.42 mg/dL (H)). Liver Function Tests:  Recent Labs Lab 06/10/17 1145  AST 23  ALT 26  ALKPHOS 70  BILITOT 1.1  PROT 7.6  ALBUMIN 3.8    Recent Labs Lab 06/10/17 1145  LIPASE 26   No results for input(s): AMMONIA in the last 168 hours. Coagulation Profile: No results for input(s): INR, PROTIME in the last 168 hours. Cardiac Enzymes:  Recent Labs Lab 06/10/17 1817 06/10/17 2353 06/11/17 0601 06/12/17 0604 06/12/17 1205  TROPONINI <0.03 <0.03 <0.03 <0.03 <0.03   BNP (last 3 results) No results for input(s): PROBNP in the last 8760 hours. HbA1C:  Recent Labs  06/10/17 1147  HGBA1C 6.7*   CBG:  Recent Labs Lab 06/11/17 2221 06/12/17 0024 06/12/17 0402 06/12/17 0810 06/12/17 1120  GLUCAP 156* 137* 146* 267* 296*   Lipid Profile: No results for input(s): CHOL, HDL, LDLCALC, TRIG, CHOLHDL, LDLDIRECT in the last 72 hours. Thyroid Function Tests: No results for input(s): TSH, T4TOTAL, FREET4, T3FREE, THYROIDAB in the last 72 hours. Anemia Panel: No results for input(s): VITAMINB12, FOLATE, FERRITIN, TIBC, IRON, RETICCTPCT in the last 72 hours. Sepsis Labs: No results for input(s): PROCALCITON, LATICACIDVEN in the last 168 hours.  Recent Results (from the past 240 hour(s))  MRSA PCR  Screening     Status: None   Collection Time: 06/10/17  5:27 PM  Result Value Ref Range Status   MRSA by PCR NEGATIVE NEGATIVE Final    Comment:        The GeneXpert MRSA Assay (FDA approved for NASAL specimens only), is one component of a comprehensive MRSA colonization surveillance program. It is not intended to diagnose MRSA infection nor to guide or monitor treatment for MRSA infections.          Radiology Studies: No results found.      Scheduled Meds: . atorvastatin  10 mg Oral Daily  . dicyclomine  10 mg Intramuscular TID  . enoxaparin (LOVENOX) injection  40 mg Subcutaneous Q24H  . hydrochlorothiazide  25  mg Oral Daily  . insulin aspart  0-9 Units Subcutaneous Q4H  . insulin detemir  8 Units Subcutaneous Daily  . lisinopril  40 mg Oral Daily  . ondansetron (ZOFRAN) IV  4 mg Intravenous Q6H   Or  . ondansetron  4 mg Oral Q6H  . pantoprazole (PROTONIX) IV  40 mg Intravenous QHS  . sertraline  100 mg Oral Daily  . sucralfate  1 g Oral QID   Continuous Infusions: . ciprofloxacin Stopped (06/12/17 1017)  . doxycycline (VIBRAMYCIN) IV Stopped (06/12/17 6154)     LOS: 1 day    Time spent: 30 minutes    Loretha Stapler, MD [facility] Pager [phone]  If 7PM-7AM, please contact night-coverage [email or web address] Password [email or web address] 06/12/2017, 4:16 PM

## 2017-06-12 NOTE — Care Management Important Message (Signed)
Important Message  Patient Details  Name: RILYN UPSHAW MRN: [id number] Date of Birth: 11/19/1970   Medicare Important Message Given:  Yes    Sherald Barge, RN 06/12/2017, 2:03 PM

## 2017-06-12 NOTE — Progress Notes (Signed)
Inpatient Diabetes Program Recommendations  AACE/ADA: New Consensus Statement on Inpatient Glycemic Control (2015)  Target Ranges:  Prepandial:   less than 140 mg/dL      Peak postprandial:   less than 180 mg/dL (1-2 hours)      Critically ill patients:  140 - 180 mg/dL   Results for Timothy Clark, Timothy Clark (MRN [id number]) as of 06/12/2017 14:25  Ref. Range 06/11/2017 16:37 06/11/2017 20:29 06/11/2017 22:21 06/12/2017 00:24 06/12/2017 04:02 06/12/2017 08:10 06/12/2017 11:20  Glucose-Capillary Latest Ref Range: 65 - 99 mg/dL 181 (H) 168 (H) 156 (H) 137 (H) 146 (H) 267 (H) 296 (H)   Review of Glycemic Control  Diabetes history: DM2 Outpatient Diabetes medications: Levemir 20 units QHS, 70/30 35 units BID, Januvia 100 mg daily Current orders for Inpatient glycemic control: Novolog 0-9 units Q4hours  Inpatient Diabetes Program Recommendations:   Glucose levels in the 200 range. Please consider ordering Levemir 8-10 units Q24H. HgbA1C: 6.7%  Thanks,  Tama Headings RN, MSN, [facility] Inpatient Diabetes Coordinator Team Pager [phone] (8a-5p)

## 2017-06-12 NOTE — Plan of Care (Signed)
Problem: Safety: Goal: Ability to remain free from injury will improve Outcome: Progressing Assessment of client and environment with the ability to remain free from injury during hospitalizations.

## 2017-06-12 NOTE — Plan of Care (Signed)
Problem: Education: Goal: Knowledge of [facility] Education information/materials will improve Outcome: Progressing Discussed current medical regimen and plan of care with client-verbalized understanding

## 2017-06-13 LAB — BASIC METABOLIC PANEL
Anion gap: 11 (ref 5–15)
BUN: 20 mg/dL (ref 6–20)
CO2: 27 mmol/L (ref 22–32)
Calcium: 8.8 mg/dL — ABNORMAL LOW (ref 8.9–10.3)
Chloride: 99 mmol/L — ABNORMAL LOW (ref 101–111)
Creatinine, Ser: 1.61 mg/dL — ABNORMAL HIGH (ref 0.61–1.24)
GFR calc Af Amer: 57 mL/min — ABNORMAL LOW (ref >60)
GFR calc non Af Amer: 49 mL/min — ABNORMAL LOW (ref >60)
Glucose, Bld: 248 mg/dL — ABNORMAL HIGH (ref 65–99)
Potassium: 3.2 mmol/L — ABNORMAL LOW (ref 3.5–5.1)
Sodium: 137 mmol/L (ref 135–145)

## 2017-06-13 LAB — GLUCOSE, CAPILLARY
Glucose-Capillary: 229 mg/dL — ABNORMAL HIGH (ref 65–99)
Glucose-Capillary: 250 mg/dL — ABNORMAL HIGH (ref 65–99)
Glucose-Capillary: 203 mg/dL — ABNORMAL HIGH (ref 65–99)
Glucose-Capillary: 193 mg/dL — ABNORMAL HIGH (ref 65–99)

## 2017-06-13 MED ORDER — POLYETHYLENE GLYCOL 3350 17 G PO PACK
17.0000 g | PACK | Freq: Every day | ORAL | Status: DC
  Administered 2017-06-13 – 2017-06-16 (×4): 17 g via ORAL
  Filled 2017-06-13 (×4): qty 1

## 2017-06-13 MED ORDER — AMLODIPINE BESYLATE 5 MG PO TABS
2.5000 mg | ORAL_TABLET | Freq: Every day | ORAL | Status: DC
  Administered 2017-06-13 – 2017-06-14 (×2): 2.5 mg via ORAL
  Filled 2017-06-13 (×2): qty 1

## 2017-06-13 MED ORDER — DICYCLOMINE HCL 20 MG PO TABS
20.0000 mg | ORAL_TABLET | Freq: Three times a day (TID) | ORAL | Status: DC
  Administered 2017-06-13: 20 mg via ORAL
  Filled 2017-06-13 (×8): qty 1

## 2017-06-13 NOTE — Progress Notes (Signed)
PROGRESS NOTE    Timothy Clark  VCB:[id number] DOB: 04-26-1971 DOA: 06/10/2017 PCP: Tawni Carnes, PA-C    Brief Narrative:  Timothy Clark is a 46 y.o. male with medical history significant of gastritis, gastroparesis, GERD, Glaucoma, HTN, Type II DM with complications that presents with nausea and vomiting for which patient has a history of.  Per patient nausea and vomiting began yesterday.  Having cramping abdominal pain.  Unable to take usual medications due to his nausea and vomiting.  Has had 5 ED visits in the past 6 months for these symptoms.  Denies fevers, chills, chest pressure, shortness of breath.  Patient reports that he has had about 10 episodes of emesis since yesterday.  Denies blood in emesis.  Patient reports chest pain that feels like a cramp that is accompanying dry heaves and vomiting.  States emesis at this time is mostly mucuous and is mostly just dry heaving.  Denies any sick contacts.    ED Course: Seen by Dr. Thurnell Garbe who ordered troponin (<0.03), UA (with small Hgb, >300 glucose, negative LE and nitrite), Cr of 1.33 (around baseline).  Was given reglan, benadryl, zofran, ativan, bentyl, pepcid, fentanyl without relief.  TRH asked to admit for intractable nausea and vomiting.   Patient was continually monitored on medications to help control his nausea and vomiting.  Of note, since admission, no providers have witnessed patient have any episodes of emesis.  He continued to voice that he was extremely nauseous and had numerous episodes of emesis throughout admission.  He repeatedly asked for pain medication.   Assessment & Plan:   Principal Problem:   Intractable nausea and vomiting Active Problems:   Diabetes mellitus type 2 with complications, uncontrolled (HCC)   UNSPECIFIED PERIPHERAL VASCULAR DISEASE   DIABETIC FOOT ULCER with Osteomyelitis   Essential hypertension   Gastroparesis due to DM (HCC)   AKI (acute kidney injury) (HCC)   Intractable  nausea and vomiting - IV zofran, pepcid, reglan - PO bentyl - patient asking to try to eat today - ativan for anxiety - KUB yesterday showing moderate stool burden - start erythromycin  Chest pain - troponins negative x 3  Osteomyelitis - continue cipro and doxy per pharm IV  Diabetes mellitus Type II - SSI - starting levemir 8units q24  Diabetic foot ulcer with osteo - continue ciprofloxacin and doxycycline  Essential HTN - IV hydralazine for SBP >180 - d/c HCTZ - start amlodipine  AKI - daily BMP   DVT prophylaxis: lovenox, SCDs Code Status:  Full Code  Family Communication:  No family bedside  Disposition Plan: likely home when improved   Consultants:   none  Procedures:   none  Antimicrobials:   cipro and doxy per outpatient regimen    Subjective: Patient asleep initially on exam.  Upon awaking states he is in pain in his abdomen.  Asking if he can try to eat today.  Says he is still throwing up mostly foamy saliva.  Objective: Vitals:   06/13/17 0436 06/13/17 0643 06/13/17 1045 06/13/17 1513  BP: (!) 204/103 (!) 192/84 (!) 174/93 (!) 159/79  Pulse: 98   89  Resp:    20  Temp: 98.2 F (36.8 C)   99 F (37.2 C)  TempSrc: Oral   Oral  SpO2: 100%   99%  Weight:      Height:        Intake/Output Summary (Last 24 hours) at 06/13/17 1525 Last data filed at 06/13/17 1517  Gross  per 24 hour  Intake                0 ml  Output             1100 ml  Net            -1100 ml   Filed Weights   06/10/17 1137 06/10/17 1722 06/11/17 0419  Weight: (!) 137.9 kg (304 lb) 133.9 kg (295 lb 3.1 oz) 133.7 kg (294 lb 12.1 oz)    Examination:  General exam: initially asleep, minimal distress upon waking Respiratory system: Clear to auscultation. Respiratory effort normal. Cardiovascular system: S1 & S2 heard, RRR. No JVD, murmurs, rubs, gallops or clicks. No pedal edema. Gastrointestinal system: Abdomen is obese, nondistended, soft and nontender.  No organomegaly or masses felt. Normal bowel sounds heard. Central nervous system: Alert and oriented.  Extremities: Symmetric 5 x 5 power. Skin: thickened skin on knees bilaterally, dry skin on bilateral lower extremities Psychiatry: distress when awakened,     Data Reviewed: I have personally reviewed following labs and imaging studies  CBC:  Recent Labs Lab 06/10/17 1145  WBC 9.1  HGB 12.4*  HCT 35.3*  MCV 87.8  PLT 253*   Basic Metabolic Panel:  Recent Labs Lab 06/10/17 1145 06/11/17 0601 06/12/17 0449 06/13/17 0852  NA 135 135 136 137  K 3.8 3.4* 3.1* 3.2*  CL 101 102 100* 99*  CO2 24 26 26 27   GLUCOSE 263* 194* 169* 248*  BUN 17 21* 18 20  CREATININE 1.33* 1.62* 1.42* 1.61*  CALCIUM 9.1 8.3* 8.5* 8.8*   GFR: Estimated Creatinine Clearance: 76.9 mL/min (A) (by C-G formula based on SCr of 1.61 mg/dL (H)). Liver Function Tests:  Recent Labs Lab 06/10/17 1145  AST 23  ALT 26  ALKPHOS 70  BILITOT 1.1  PROT 7.6  ALBUMIN 3.8    Recent Labs Lab 06/10/17 1145  LIPASE 26   No results for input(s): AMMONIA in the last 168 hours. Coagulation Profile: No results for input(s): INR, PROTIME in the last 168 hours. Cardiac Enzymes:  Recent Labs Lab 06/10/17 2353 06/11/17 0601 06/12/17 0604 06/12/17 1205 06/12/17 1856  TROPONINI <0.03 <0.03 <0.03 <0.03 <0.03   BNP (last 3 results) No results for input(s): PROBNP in the last 8760 hours. HbA1C: No results for input(s): HGBA1C in the last 72 hours. CBG:  Recent Labs Lab 06/12/17 1120 06/12/17 1620 06/12/17 2130 06/13/17 0732 06/13/17 1132  GLUCAP 296* 194* 228* 229* 250*   Lipid Profile: No results for input(s): CHOL, HDL, LDLCALC, TRIG, CHOLHDL, LDLDIRECT in the last 72 hours. Thyroid Function Tests: No results for input(s): TSH, T4TOTAL, FREET4, T3FREE, THYROIDAB in the last 72 hours. Anemia Panel: No results for input(s): VITAMINB12, FOLATE, FERRITIN, TIBC, IRON, RETICCTPCT in the last  72 hours. Sepsis Labs: No results for input(s): PROCALCITON, LATICACIDVEN in the last 168 hours.  Recent Results (from the past 240 hour(s))  MRSA PCR Screening     Status: None   Collection Time: 06/10/17  5:27 PM  Result Value Ref Range Status   MRSA by PCR NEGATIVE NEGATIVE Final    Comment:        The GeneXpert MRSA Assay (FDA approved for NASAL specimens only), is one component of a comprehensive MRSA colonization surveillance program. It is not intended to diagnose MRSA infection nor to guide or monitor treatment for MRSA infections.          Radiology Studies: Dg Abd 1 View  Result Date:  06/12/2017 CLINICAL DATA:  46 year old male with intractable nausea vomiting. EXAM: ABDOMEN - 1 VIEW COMPARISON:  Abdominal radiograph dated 06/10/2017 FINDINGS: There is moderate colonic stool burden. No bowel dilatation or evidence of obstruction. No free air noted on the provided images. No radiopaque calculi. The osseous structures and soft tissues are grossly unremarkable. Partially visualized hazy densities over the right lung base may represent atelectasis versus effusion. Clinical correlation is recommended. IMPRESSION: Moderate colonic stool burden.  No bowel obstruction. Probable right lung base atelectasis/infiltrate and small pleural effusion. Clinical correlation is recommended. Electronically Signed   By: Anner Crete M.D.   On: 06/12/2017 19:54        Scheduled Meds: . atorvastatin  10 mg Oral Daily  . dicyclomine  10 mg Intramuscular TID  . enoxaparin (LOVENOX) injection  40 mg Subcutaneous Q24H  . hydrochlorothiazide  25 mg Oral Daily  . insulin aspart  0-9 Units Subcutaneous Q4H  . insulin detemir  8 Units Subcutaneous Daily  . lisinopril  40 mg Oral Daily  . ondansetron (ZOFRAN) IV  4 mg Intravenous Q6H   Or  . ondansetron  4 mg Oral Q6H  . pantoprazole (PROTONIX) IV  40 mg Intravenous QHS  . polyethylene glycol  17 g Oral Daily  . sertraline  100 mg Oral  Daily  . sucralfate  1 g Oral QID   Continuous Infusions: . ciprofloxacin Stopped (06/13/17 1049)  . doxycycline (VIBRAMYCIN) IV Stopped (06/13/17 0703)     LOS: 2 days    Time spent: 25 minutes    Loretha Stapler, MD [facility] Pager [phone]  If 7PM-7AM, please contact night-coverage [email or web address] Password [email or web address] 06/13/2017, 3:25 PM

## 2017-06-14 ENCOUNTER — Other Ambulatory Visit: Payer: Self-pay

## 2017-06-14 LAB — BASIC METABOLIC PANEL
Anion gap: 11 (ref 5–15)
BUN: 21 mg/dL — ABNORMAL HIGH (ref 6–20)
CO2: 25 mmol/L (ref 22–32)
Calcium: 8.5 mg/dL — ABNORMAL LOW (ref 8.9–10.3)
Chloride: 98 mmol/L — ABNORMAL LOW (ref 101–111)
Creatinine, Ser: 1.51 mg/dL — ABNORMAL HIGH (ref 0.61–1.24)
GFR calc Af Amer: >60 mL/min (ref >60)
GFR calc non Af Amer: 53 mL/min — ABNORMAL LOW (ref >60)
Glucose, Bld: 273 mg/dL — ABNORMAL HIGH (ref 65–99)
Potassium: 3 mmol/L — ABNORMAL LOW (ref 3.5–5.1)
Sodium: 134 mmol/L — ABNORMAL LOW (ref 135–145)

## 2017-06-14 LAB — GLUCOSE, CAPILLARY
Glucose-Capillary: 238 mg/dL — ABNORMAL HIGH (ref 65–99)
Glucose-Capillary: 238 mg/dL — ABNORMAL HIGH (ref 65–99)
Glucose-Capillary: 193 mg/dL — ABNORMAL HIGH (ref 65–99)
Glucose-Capillary: 202 mg/dL — ABNORMAL HIGH (ref 65–99)

## 2017-06-14 MED ORDER — CHLORPROMAZINE HCL 25 MG PO TABS
25.0000 mg | ORAL_TABLET | Freq: Three times a day (TID) | ORAL | Status: DC
  Administered 2017-06-14 – 2017-06-16 (×6): 25 mg via ORAL
  Filled 2017-06-14 (×10): qty 1

## 2017-06-14 MED ORDER — INSULIN ASPART 100 UNIT/ML ~~LOC~~ SOLN
0.0000 [IU] | Freq: Three times a day (TID) | SUBCUTANEOUS | Status: DC
  Administered 2017-06-14: 3 [IU] via SUBCUTANEOUS
  Administered 2017-06-15: 8 [IU] via SUBCUTANEOUS

## 2017-06-14 MED ORDER — INSULIN DETEMIR 100 UNIT/ML ~~LOC~~ SOLN
11.0000 [IU] | Freq: Every day | SUBCUTANEOUS | Status: DC
  Administered 2017-06-15 – 2017-06-16 (×2): 11 [IU] via SUBCUTANEOUS
  Filled 2017-06-14 (×3): qty 0.11

## 2017-06-14 MED ORDER — ENOXAPARIN SODIUM 40 MG/0.4ML ~~LOC~~ SOLN
40.0000 mg | SUBCUTANEOUS | Status: DC
  Administered 2017-06-14 – 2017-06-15 (×2): 40 mg via SUBCUTANEOUS
  Filled 2017-06-14 (×2): qty 0.4

## 2017-06-14 MED ORDER — DICYCLOMINE HCL 10 MG PO CAPS
20.0000 mg | ORAL_CAPSULE | Freq: Three times a day (TID) | ORAL | Status: DC
  Administered 2017-06-14 – 2017-06-16 (×11): 20 mg via ORAL
  Filled 2017-06-14 (×11): qty 2

## 2017-06-14 MED ORDER — DIPHENHYDRAMINE HCL 50 MG/ML IJ SOLN: 25.0000 mg | Freq: Four times a day (QID) | INTRAMUSCULAR | Status: DC | PRN

## 2017-06-14 MED ORDER — HYDROMORPHONE HCL 1 MG/ML IJ SOLN
1.0000 mg | INTRAMUSCULAR | Status: DC | PRN
  Administered 2017-06-14 – 2017-06-15 (×2): 1 mg via INTRAVENOUS
  Filled 2017-06-14 (×2): qty 1

## 2017-06-14 NOTE — Progress Notes (Signed)
Pt refused doxycycline this am. He said that the MD he is seeing at the wound clinic told him to stop taking doxycycline. I explained that he has received several doses during his admission and the MD here prescribed it because it would further benefit him. He stated he did not want to take it until he personally speaks to the doctor about it.

## 2017-06-14 NOTE — Progress Notes (Signed)
PROGRESS NOTE    Timothy Clark  OIZ:[id number] DOB: July 29, 1971 DOA: 06/10/2017 PCP: Tawni Carnes, PA-C    Brief Narrative:  Timothy Clark is a 46 y.o. male with medical history significant of gastritis, gastroparesis, GERD, Glaucoma, HTN, Type II DM with complications that presents with nausea and vomiting for which patient has a history of.  Per patient nausea and vomiting began yesterday.  Having cramping abdominal pain.  Unable to take usual medications due to his nausea and vomiting.  Has had 5 ED visits in the past 6 months for these symptoms.  Denies fevers, chills, chest pressure, shortness of breath.  Patient reports that he has had about 10 episodes of emesis since yesterday.  Denies blood in emesis.  Patient reports chest pain that feels like a cramp that is accompanying dry heaves and vomiting.  States emesis at this time is mostly mucuous and is mostly just dry heaving.  Denies any sick contacts.    ED Course: Seen by Dr. Thurnell Garbe who ordered troponin (<0.03), UA (with small Hgb, >300 glucose, negative LE and nitrite), Cr of 1.33 (around baseline).  Was given reglan, benadryl, zofran, ativan, bentyl, pepcid, fentanyl without relief.  [facility] asked to admit for intractable nausea and vomiting.   Patient was continually monitored on medications to help control his nausea and vomiting.  Of note, since admission, no providers have witnessed patient have any episodes of emesis.  He continued to voice that he was extremely nauseous and had numerous episodes of emesis throughout admission.  He repeatedly asked for pain medication.  He failed to improve in terms of his nausea and "vomiting" and so gastroenterology was consulted.   Assessment & Plan:   Principal Problem:   Intractable nausea and vomiting Active Problems:   Diabetes mellitus type 2 with complications, uncontrolled (HCC)   UNSPECIFIED PERIPHERAL VASCULAR DISEASE   DIABETIC FOOT ULCER with Osteomyelitis   Essential  hypertension   Gastroparesis due to DM (HCC)   AKI (acute kidney injury) (HCC)   Intractable nausea and vomiting - IV zofran, pepcid, reglan - PO bentyl - soft diet - ativan for anxiety - KUB on 11/2 showing moderate stool burden - start erythromycin - patient is failing to improve - will consult GI  Hypokalemia - replace potassium with IV  Chest pain - troponins negative x 3  Osteomyelitis - continue cipro and doxy per pharm IV  Diabetes mellitus Type II - SSI - starting levemir 8units q24  Diabetic foot ulcer with osteo - continue ciprofloxacin and doxycycline - patient refused doxycycline this am - per last ID note patient was supposed to continue doxycycline  Essential HTN - IV hydralazine for SBP >180 - d/c HCTZ - start amlodipine  AKI - daily BMP   DVT prophylaxis: lovenox, SCDs Code Status:  Full Code  Family Communication:  No family bedside  Disposition Plan: likely home when improved   Consultants:   Gastroenterology  Procedures:   none  Antimicrobials:   cipro and doxy per outpatient regimen    Subjective: Patient getting blood draw this am.  Says he attempted to eat oatmeal this am but felt nauseous and vomited.  Says pain medication not helping.  Objective: Vitals:   06/13/17 1513 06/13/17 2038 06/13/17 2042 06/14/17 0430  BP: (!) 159/79  129/64   Pulse: 89  82 93  Resp: 20  20 18   Temp: 99 F (37.2 C)  98.4 F (36.9 C) 99.1 F (37.3 C)  TempSrc: Oral  Oral  Oral  SpO2: 99% 96% 98% 100%  Weight:      Height:        Intake/Output Summary (Last 24 hours) at 06/14/2017 1011 Last data filed at 06/14/2017 0900 Gross per 24 hour  Intake 0 ml  Output 1000 ml  Net -1000 ml   Filed Weights   06/10/17 1137 06/10/17 1722 06/11/17 0419  Weight: (!) 137.9 kg (304 lb) 133.9 kg (295 lb 3.1 oz) 133.7 kg (294 lb 12.1 oz)    Examination:  General exam: laying in bed comfortably during blood draw Respiratory system: Clear to  auscultation. Respiratory effort normal. Cardiovascular system: S1 & S2 heard, RRR. No JVD, murmurs, rubs, gallops or clicks.  Gastrointestinal system: Abdomen is obese, nondistended, soft and minimally tender in the epigastrium. No organomegaly or masses felt. Normal bowel sounds heard. Central nervous system: Alert and oriented.  Extremities: Symmetric 5 x 5 power. Skin: thickened skin on knees bilaterally, dry skin on bilateral lower extremities Psychiatry: mild distress during interview and exam    Data Reviewed: I have personally reviewed following labs and imaging studies  CBC: Recent Labs  Lab 06/10/17 1145  WBC 9.1  HGB 12.4*  HCT 35.3*  MCV 87.8  PLT 622*   Basic Metabolic Panel: Recent Labs  Lab 06/10/17 1145 06/11/17 0601 06/12/17 0449 06/13/17 0852 06/14/17 0930  NA 135 135 136 137 134*  K 3.8 3.4* 3.1* 3.2* 3.0*  CL 101 102 100* 99* 98*  CO2 24 26 26 27 25   GLUCOSE 263* 194* 169* 248* 273*  BUN 17 21* 18 20 21*  CREATININE 1.33* 1.62* 1.42* 1.61* 1.51*  CALCIUM 9.1 8.3* 8.5* 8.8* 8.5*   GFR: Estimated Creatinine Clearance: 82 mL/min (A) (by C-G formula based on SCr of 1.51 mg/dL (H)). Liver Function Tests: Recent Labs  Lab 06/10/17 1145  AST 23  ALT 26  ALKPHOS 70  BILITOT 1.1  PROT 7.6  ALBUMIN 3.8   Recent Labs  Lab 06/10/17 1145  LIPASE 26   No results for input(s): AMMONIA in the last 168 hours. Coagulation Profile: No results for input(s): INR, PROTIME in the last 168 hours. Cardiac Enzymes: Recent Labs  Lab 06/10/17 2353 06/11/17 0601 06/12/17 0604 06/12/17 1205 06/12/17 1856  TROPONINI <0.03 <0.03 <0.03 <0.03 <0.03   BNP (last 3 results) No results for input(s): PROBNP in the last 8760 hours. HbA1C: No results for input(s): HGBA1C in the last 72 hours. CBG: Recent Labs  Lab 06/13/17 0732 06/13/17 1132 06/13/17 1639 06/13/17 2044 06/14/17 0744  GLUCAP 229* 250* 203* 193* 238*   Lipid Profile: No results for  input(s): CHOL, HDL, LDLCALC, TRIG, CHOLHDL, LDLDIRECT in the last 72 hours. Thyroid Function Tests: No results for input(s): TSH, T4TOTAL, FREET4, T3FREE, THYROIDAB in the last 72 hours. Anemia Panel: No results for input(s): VITAMINB12, FOLATE, FERRITIN, TIBC, IRON, RETICCTPCT in the last 72 hours. Sepsis Labs: No results for input(s): PROCALCITON, LATICACIDVEN in the last 168 hours.  Recent Results (from the past 240 hour(s))  MRSA PCR Screening     Status: None   Collection Time: 06/10/17  5:27 PM  Result Value Ref Range Status   MRSA by PCR NEGATIVE NEGATIVE Final    Comment:        The GeneXpert MRSA Assay (FDA approved for NASAL specimens only), is one component of a comprehensive MRSA colonization surveillance program. It is not intended to diagnose MRSA infection nor to guide or monitor treatment for MRSA infections.  Radiology Studies: Dg Abd 1 View  Result Date: 06/12/2017 CLINICAL DATA:  46 year old male with intractable nausea vomiting. EXAM: ABDOMEN - 1 VIEW COMPARISON:  Abdominal radiograph dated 06/10/2017 FINDINGS: There is moderate colonic stool burden. No bowel dilatation or evidence of obstruction. No free air noted on the provided images. No radiopaque calculi. The osseous structures and soft tissues are grossly unremarkable. Partially visualized hazy densities over the right lung base may represent atelectasis versus effusion. Clinical correlation is recommended. IMPRESSION: Moderate colonic stool burden.  No bowel obstruction. Probable right lung base atelectasis/infiltrate and small pleural effusion. Clinical correlation is recommended. Electronically Signed   By: Anner Crete M.D.   On: 06/12/2017 19:54        Scheduled Meds: . amLODipine  2.5 mg Oral Daily  . atorvastatin  10 mg Oral Daily  . dicyclomine  20 mg Oral TID AC & HS  . enoxaparin (LOVENOX) injection  40 mg Subcutaneous Q24H  . insulin aspart  0-9 Units Subcutaneous Q4H  .  insulin detemir  8 Units Subcutaneous Daily  . lisinopril  40 mg Oral Daily  . ondansetron (ZOFRAN) IV  4 mg Intravenous Q6H   Or  . ondansetron  4 mg Oral Q6H  . pantoprazole (PROTONIX) IV  40 mg Intravenous QHS  . polyethylene glycol  17 g Oral Daily  . sertraline  100 mg Oral Daily  . sucralfate  1 g Oral QID   Continuous Infusions: . ciprofloxacin 400 mg (06/14/17 0822)  . doxycycline (VIBRAMYCIN) IV Stopped (06/13/17 1906)     LOS: 3 days    Time spent: 25 minutes    Loretha Stapler, MD [facility] Pager [phone]  If 7PM-7AM, please contact night-coverage [email or web address] Password [email or web address] 06/14/2017, 10:11 AM

## 2017-06-15 ENCOUNTER — Encounter (HOSPITAL_COMMUNITY): Payer: Self-pay | Admitting: Gastroenterology

## 2017-06-15 DIAGNOSIS — K3184 Gastroparesis: Secondary | ICD-10-CM

## 2017-06-15 DIAGNOSIS — E1143 Type 2 diabetes mellitus with diabetic autonomic (poly)neuropathy: Secondary | ICD-10-CM

## 2017-06-15 DIAGNOSIS — R112 Nausea with vomiting, unspecified: Secondary | ICD-10-CM

## 2017-06-15 LAB — CBC WITH DIFFERENTIAL/PLATELET
Basophils Absolute: 0 10*3/uL (ref 0.0–0.1)
Basophils Relative: 0 %
Eosinophils Absolute: 0.2 10*3/uL (ref 0.0–0.7)
Eosinophils Relative: 2 %
HCT: 34 % — ABNORMAL LOW (ref 39.0–52.0)
Hemoglobin: 11.7 g/dL — ABNORMAL LOW (ref 13.0–17.0)
Lymphocytes Relative: 21 %
Lymphs Abs: 2.3 10*3/uL (ref 0.7–4.0)
MCH: 30.7 pg (ref 26.0–34.0)
MCHC: 34.4 g/dL (ref 30.0–36.0)
MCV: 89.2 fL (ref 78.0–100.0)
Monocytes Absolute: 1.2 10*3/uL — ABNORMAL HIGH (ref 0.1–1.0)
Monocytes Relative: 11 %
Neutro Abs: 7.4 10*3/uL (ref 1.7–7.7)
Neutrophils Relative %: 66 %
Platelets: 142 10*3/uL — ABNORMAL LOW (ref 150–400)
RBC: 3.81 MIL/uL — ABNORMAL LOW (ref 4.22–5.81)
RDW: 12.7 % (ref 11.5–15.5)
WBC: 11.1 10*3/uL — ABNORMAL HIGH (ref 4.0–10.5)

## 2017-06-15 LAB — BASIC METABOLIC PANEL
Anion gap: 9 (ref 5–15)
BUN: 23 mg/dL — ABNORMAL HIGH (ref 6–20)
CO2: 29 mmol/L (ref 22–32)
Calcium: 8.2 mg/dL — ABNORMAL LOW (ref 8.9–10.3)
Chloride: 97 mmol/L — ABNORMAL LOW (ref 101–111)
Creatinine, Ser: 1.81 mg/dL — ABNORMAL HIGH (ref 0.61–1.24)
GFR calc Af Amer: 50 mL/min — ABNORMAL LOW (ref >60)
GFR calc non Af Amer: 43 mL/min — ABNORMAL LOW (ref >60)
Glucose, Bld: 190 mg/dL — ABNORMAL HIGH (ref 65–99)
Potassium: 2.8 mmol/L — ABNORMAL LOW (ref 3.5–5.1)
Sodium: 135 mmol/L (ref 135–145)

## 2017-06-15 LAB — FERRITIN: Ferritin: 344 ng/mL — ABNORMAL HIGH (ref 24–336)

## 2017-06-15 LAB — GLUCOSE, CAPILLARY
Glucose-Capillary: 261 mg/dL — ABNORMAL HIGH (ref 65–99)
Glucose-Capillary: 233 mg/dL — ABNORMAL HIGH (ref 65–99)
Glucose-Capillary: 149 mg/dL — ABNORMAL HIGH (ref 65–99)
Glucose-Capillary: 183 mg/dL — ABNORMAL HIGH (ref 65–99)
Glucose-Capillary: 165 mg/dL — ABNORMAL HIGH (ref 65–99)

## 2017-06-15 LAB — HEPATIC FUNCTION PANEL
ALT: 16 U/L — ABNORMAL LOW (ref 17–63)
AST: 23 U/L (ref 15–41)
Albumin: 3.5 g/dL (ref 3.5–5.0)
Alkaline Phosphatase: 57 U/L (ref 38–126)
Bilirubin, Direct: 0.2 mg/dL (ref 0.1–0.5)
Indirect Bilirubin: 0.9 mg/dL (ref 0.3–0.9)
Total Bilirubin: 1.1 mg/dL (ref 0.3–1.2)
Total Protein: 7.4 g/dL (ref 6.5–8.1)

## 2017-06-15 LAB — IRON AND TIBC
Iron: 44 ug/dL — ABNORMAL LOW (ref 45–182)
Saturation Ratios: 22 % (ref 17.9–39.5)
TIBC: 203 ug/dL — ABNORMAL LOW (ref 250–450)
UIBC: 159 ug/dL

## 2017-06-15 LAB — FOLATE: Folate: 13 ng/mL (ref >5.9)

## 2017-06-15 LAB — VITAMIN B12: Vitamin B-12: 205 pg/mL (ref 180–914)

## 2017-06-15 LAB — RETICULOCYTES
RBC.: 4.41 MIL/uL (ref 4.22–5.81)
Retic Count, Absolute: 154.4 10*3/uL (ref 19.0–186.0)
Retic Ct Pct: 3.5 % — ABNORMAL HIGH (ref 0.4–3.1)

## 2017-06-15 MED ORDER — SODIUM CHLORIDE 0.9 % IV SOLN
INTRAVENOUS | Status: AC
  Administered 2017-06-15 – 2017-06-16 (×2): via INTRAVENOUS

## 2017-06-15 MED ORDER — METOCLOPRAMIDE HCL 5 MG/ML IJ SOLN
5.0000 mg | Freq: Three times a day (TID) | INTRAMUSCULAR | Status: DC
  Administered 2017-06-15 – 2017-06-16 (×5): 5 mg via INTRAVENOUS
  Filled 2017-06-15 (×6): qty 2

## 2017-06-15 MED ORDER — INSULIN ASPART 100 UNIT/ML ~~LOC~~ SOLN: 0.0000 [IU] | Freq: Every day | SUBCUTANEOUS | Status: DC

## 2017-06-15 MED ORDER — INSULIN ASPART 100 UNIT/ML ~~LOC~~ SOLN
0.0000 [IU] | Freq: Three times a day (TID) | SUBCUTANEOUS | Status: DC
  Administered 2017-06-15: 5 [IU] via SUBCUTANEOUS
  Administered 2017-06-15: 2 [IU] via SUBCUTANEOUS
  Administered 2017-06-16: 3 [IU] via SUBCUTANEOUS
  Administered 2017-06-16: 2 [IU] via SUBCUTANEOUS
  Administered 2017-06-16: 3 [IU] via SUBCUTANEOUS

## 2017-06-15 MED ORDER — INSULIN ASPART 100 UNIT/ML ~~LOC~~ SOLN
3.0000 [IU] | Freq: Three times a day (TID) | SUBCUTANEOUS | Status: DC
  Administered 2017-06-15 – 2017-06-16 (×5): 3 [IU] via SUBCUTANEOUS

## 2017-06-15 MED ORDER — AMLODIPINE BESYLATE 5 MG PO TABS
5.0000 mg | ORAL_TABLET | Freq: Every day | ORAL | Status: DC
  Administered 2017-06-15 – 2017-06-16 (×2): 5 mg via ORAL
  Filled 2017-06-15 (×2): qty 1

## 2017-06-15 MED ORDER — POTASSIUM CHLORIDE 10 MEQ/100ML IV SOLN
10.0000 meq | INTRAVENOUS | Status: AC
  Administered 2017-06-15 (×5): 10 meq via INTRAVENOUS
  Filled 2017-06-15 (×5): qty 100

## 2017-06-15 MED ORDER — PANTOPRAZOLE SODIUM 40 MG IV SOLR
40.0000 mg | Freq: Two times a day (BID) | INTRAVENOUS | Status: DC
  Administered 2017-06-15 – 2017-06-16 (×3): 40 mg via INTRAVENOUS
  Filled 2017-06-15 (×3): qty 40

## 2017-06-15 NOTE — Progress Notes (Signed)
Inpatient Diabetes Program Recommendations  AACE/ADA: New Consensus Statement on Inpatient Glycemic Control (2015)  Target Ranges:  Prepandial:   less than 140 mg/dL      Peak postprandial:   less than 180 mg/dL (1-2 hours)      Critically ill patients:  140 - 180 mg/dL   Results for Timothy Clark, Timothy Clark (MRN [id number]) as of 06/15/2017 08:38  Ref. Range 06/14/2017 07:44 06/14/2017 11:16 06/14/2017 16:10 06/14/2017 22:28 06/15/2017 07:35  Glucose-Capillary Latest Ref Range: 65 - 99 mg/dL 238 (H) 238 (H) 193 (H) 202 (H) 261 (H)    Review of Glycemic Control  Diabetes history: DM2 Outpatient Diabetes medications: Levemir 20 units QHS, 70/30 35 units BID, Januvia 100 mg daily Current orders for Inpatient glycemic control: Levemir 11 units daily, Novolog 0-15 units TID with meals  Inpatient Diabetes Program Recommendations:  Insulin - Basal: Patient received Levemir 8 untis on 06/14/17 and will be receiving Levemir 11 units today as ordered. Correction (SSI): Please consider ordering Novolog 0-5 units QHS. Insulin - Meal Coverage: Please consider ordering Novolog 3 units TID with meals for meal coverage if patient eats at least 50% of meals. HgbA1C: A1C 6.7% on 06/10/17 indicating an average glucose of 146 mg/dl.  Thanks, Barnie Alderman, RN, MSN, CDE Diabetes Coordinator Inpatient Diabetes Program [phone] (Team Pager from 8am to 5pm)

## 2017-06-15 NOTE — Progress Notes (Signed)
PROGRESS NOTE    ROYALE SWAMY  WUJ:[id number] DOB: 1971-02-04 DOA: 06/10/2017 PCP: Tawni Carnes, PA-C    Brief Narrative:  Timothy Clark is a 46 y.o. male with medical history significant of gastritis, gastroparesis, GERD, Glaucoma, HTN, Type II DM with complications that presents with nausea and vomiting for which patient has a history of.  Per patient nausea and vomiting began yesterday.  Having cramping abdominal pain.  Unable to take usual medications due to his nausea and vomiting.  Has had 5 ED visits in the past 6 months for these symptoms.  Denies fevers, chills, chest pressure, shortness of breath.  Patient reports that he has had about 10 episodes of emesis since yesterday.  Denies blood in emesis.  Patient reports chest pain that feels like a cramp that is accompanying dry heaves and vomiting.  States emesis at this time is mostly mucuous and is mostly just dry heaving.  Denies any sick contacts.    ED Course: Seen by Dr. Thurnell Garbe who ordered troponin (<0.03), UA (with small Hgb, >300 glucose, negative LE and nitrite), Cr of 1.33 (around baseline).  Was given reglan, benadryl, zofran, ativan, bentyl, pepcid, fentanyl without relief.  TRH asked to admit for intractable nausea and vomiting.   Patient was continually monitored on medications to help control his nausea and vomiting.  Of note, since admission, no providers have witnessed patient have any episodes of emesis.  He continued to voice that he was extremely nauseous and had numerous episodes of emesis throughout admission.  He repeatedly asked for pain medication.  He failed to improve in terms of his nausea and "vomiting" and so gastroenterology was consulted.   Assessment & Plan:   Principal Problem:   Intractable nausea and vomiting Active Problems:   Diabetes mellitus type 2 with complications, uncontrolled (HCC)   UNSPECIFIED PERIPHERAL VASCULAR DISEASE   DIABETIC FOOT ULCER with Osteomyelitis   Essential  hypertension   Gastroparesis due to DM (HCC)   AKI (acute kidney injury) (HCC)   Intractable nausea and vomiting - IV zofran, pepcid, reglan - PO bentyl - soft diet - ativan for anxiety - KUB on 11/2 showing moderate stool burden - start erythromycin - patient is failing to improve - GI consulted - PPI - if patient fails to improve may need EGD  Hypokalemia - IV potassium replacement 21mEq x 5  Chest pain - troponins negative x 3  Osteomyelitis - continue cipro and doxy per pharm IV - last note from infectious disease states patient to continue abx and if unable to tolerate PO may require PICC line and IV  Diabetes mellitus Type II - SSI - levemir 11units - start novolog 3 units TIDWM  Diabetic foot ulcer with osteo - as above  Essential HTN - IV hydralazine for SBP >180 - d/c HCTZ - lisinopril held given elevated creatinine - amlodipine 5mg  to be increased to 10mg  - creatinine slightly elevated  AKI - daily BMP - creatinine increase  - will given IVF for next 20 hours   DVT prophylaxis: lovenox, SCDs Code Status:  Full Code  Family Communication:  No family bedside  Disposition Plan: likely home when improved   Consultants:   Gastroenterology  Procedures:   none  Antimicrobials:   cipro and doxy per outpatient regimen    Subjective: Patient asleep.  Upon awaking says he feels like he has a knot in his epigastric area that feels like severe reflux.  Was able to sleep with addition of pain  medication.    Objective: Vitals:   06/14/17 0430 06/14/17 1549 06/14/17 2225 06/15/17 0555  BP:  (!) 180/91 (!) 153/80 (!) 201/84  Pulse: 93 90 93 90  Resp: 18 18 20 18   Temp: 99.1 F (37.3 C) 98.7 F (37.1 C) 99 F (37.2 C) 98.7 F (37.1 C)  TempSrc: Oral Oral Oral Oral  SpO2: 100% 100% 96% 97%  Weight:      Height:        Intake/Output Summary (Last 24 hours) at 06/15/2017 1045 Last data filed at 06/15/2017 0601 Gross per 24 hour    Intake 450 ml  Output 925 ml  Net -475 ml   Filed Weights   06/10/17 1137 06/10/17 1722 06/11/17 0419  Weight: (!) 137.9 kg (304 lb) 133.9 kg (295 lb 3.1 oz) 133.7 kg (294 lb 12.1 oz)    Examination:  Physical Exam  Constitutional: He is oriented to person, place, and time and well-developed, well-nourished, and in no distress.  Cardiovascular: Normal rate, regular rhythm and normal heart sounds.  Pulmonary/Chest: Effort normal and breath sounds normal.  Abdominal: Soft. Bowel sounds are normal. He exhibits no distension. There is tenderness. There is no rebound and no guarding.  Tenderness in epigastrium  Neurological: He is alert and oriented to person, place, and time.     Data Reviewed: I have personally reviewed following labs and imaging studies  CBC: Recent Labs  Lab 06/10/17 1145 06/15/17 0417  WBC 9.1 11.1*  NEUTROABS  --  7.4  HGB 12.4* 11.7*  HCT 35.3* 34.0*  MCV 87.8 89.2  PLT 140* 941*   Basic Metabolic Panel: Recent Labs  Lab 06/11/17 0601 06/12/17 0449 06/13/17 0852 06/14/17 0930 06/15/17 0417  NA 135 136 137 134* 135  K 3.4* 3.1* 3.2* 3.0* 2.8*  CL 102 100* 99* 98* 97*  CO2 26 26 27 25 29   GLUCOSE 194* 169* 248* 273* 190*  BUN 21* 18 20 21* 23*  CREATININE 1.62* 1.42* 1.61* 1.51* 1.81*  CALCIUM 8.3* 8.5* 8.8* 8.5* 8.2*   GFR: Estimated Creatinine Clearance: 68.4 mL/min (A) (by C-G formula based on SCr of 1.81 mg/dL (H)). Liver Function Tests: Recent Labs  Lab 06/10/17 1145  AST 23  ALT 26  ALKPHOS 70  BILITOT 1.1  PROT 7.6  ALBUMIN 3.8   Recent Labs  Lab 06/10/17 1145  LIPASE 26   No results for input(s): AMMONIA in the last 168 hours. Coagulation Profile: No results for input(s): INR, PROTIME in the last 168 hours. Cardiac Enzymes: Recent Labs  Lab 06/10/17 2353 06/11/17 0601 06/12/17 0604 06/12/17 1205 06/12/17 1856  TROPONINI <0.03 <0.03 <0.03 <0.03 <0.03   BNP (last 3 results) No results for input(s): PROBNP in  the last 8760 hours. HbA1C: No results for input(s): HGBA1C in the last 72 hours. CBG: Recent Labs  Lab 06/14/17 0744 06/14/17 1116 06/14/17 1610 06/14/17 2228 06/15/17 0735  GLUCAP 238* 238* 193* 202* 261*   Lipid Profile: No results for input(s): CHOL, HDL, LDLCALC, TRIG, CHOLHDL, LDLDIRECT in the last 72 hours. Thyroid Function Tests: No results for input(s): TSH, T4TOTAL, FREET4, T3FREE, THYROIDAB in the last 72 hours. Anemia Panel: No results for input(s): VITAMINB12, FOLATE, FERRITIN, TIBC, IRON, RETICCTPCT in the last 72 hours. Sepsis Labs: No results for input(s): PROCALCITON, LATICACIDVEN in the last 168 hours.  Recent Results (from the past 240 hour(s))  MRSA PCR Screening     Status: None   Collection Time: 06/10/17  5:27 PM  Result Value  Ref Range Status   MRSA by PCR NEGATIVE NEGATIVE Final    Comment:        The GeneXpert MRSA Assay (FDA approved for NASAL specimens only), is one component of a comprehensive MRSA colonization surveillance program. It is not intended to diagnose MRSA infection nor to guide or monitor treatment for MRSA infections.          Radiology Studies: No results found.      Scheduled Meds: . amLODipine  5 mg Oral Daily  . atorvastatin  10 mg Oral Daily  . chlorproMAZINE  25 mg Oral TID  . dicyclomine  20 mg Oral TID AC & HS  . enoxaparin (LOVENOX) injection  40 mg Subcutaneous Q24H  . insulin aspart  0-15 Units Subcutaneous TID WC  . insulin detemir  11 Units Subcutaneous Daily  . lisinopril  40 mg Oral Daily  . metoCLOPramide (REGLAN) injection  5 mg Intravenous TID AC & HS  . ondansetron (ZOFRAN) IV  4 mg Intravenous Q6H   Or  . ondansetron  4 mg Oral Q6H  . pantoprazole (PROTONIX) IV  40 mg Intravenous Q12H  . polyethylene glycol  17 g Oral Daily  . sertraline  100 mg Oral Daily  . sucralfate  1 g Oral QID   Continuous Infusions: . ciprofloxacin Stopped (06/15/17 0856)  . doxycycline (VIBRAMYCIN) IV Stopped  (06/15/17 0804)     LOS: 4 days    Time spent: 30 minutes    Loretha Stapler, MD [facility] Pager [phone]  If 7PM-7AM, please contact night-coverage [email or web address] Password [email or web address] 06/15/2017, 10:45 AM

## 2017-06-15 NOTE — Consult Note (Signed)
Referring Provider: Dr. Adair Patter  Primary Care Physician:  Tawni Carnes, PA-C Primary Gastroenterologist:  Dr. Gala Romney   Date of Admission: 06/10/17 Date of Consultation: 06/15/17  Reason for Consultation:  Intractable nausea and vomiting  HPI:  Timothy Clark is a 46 y.o. year old male with history of chronic N/V, previously evaluated by [facility] in 2016. [facility] felt he had dumping syndrome due to rapid gastric emptying on GES in Nov 2016. His last appt was in Feb 2017 at [place] history of esophagitis. Last EGD at [facility] in Jan 2017 with retained gastric contents and mild esophgaitis. He was seen as an outpatient in May 2018 and felt that dietary behavior was playing a large role in his symptoms. Reglan had been taken for a short course and BID PPI recommended. Historically, he has done well on Reglan and seemed to fit the clinical picture of gastroparesis.   States he woke up this morning feeling fine. He took a few pills and noted pain in his epigastric region. Has severe anxiety with the pain. States he hasn't eaten anything in 4 days. For breakfast had a "spoonful of something". Made him feel full and feels like he has severe gas and heartburn with any intake. Most recent A1c 6.7 in October 2018. Take 20 my of Bentyl before meals and at bedtime, started by [facility]. Has hiccups since arriving to hospital. States he had an acute onset of N/V and pain, prompting ED presentation. States this originally started as just mild discomfort but got worse daily. States symptoms worsening about a month ago. Taking Pronotix BID, carafate, and Reglan BID as outpatient. Eating a lot of grilled chicken. Cutting back on sodas. No fried foods. Had  BM on admission but none since.    HIDA scan in 2016 with normal EF of 92%. History of H.pylori gastritis in 2016. He is also being treated for osteomyelitis (history of diabetic right foot ulcer). Last saw ID on 05/25/17 and prescribed doxycycline  and ciprofloxacin.   Past Medical History:  Diagnosis Date  . Chronic abdominal pain   . Esophagitis   . Eye hemorrhage   . Gastritis   . Gastroparesis   . GERD (gastroesophageal reflux disease)   . Glaucoma   . Helicobacter pylori (H. pylori) infection 2016   ERADICATION DOCUMENTED VIA STOOL TEST in AUG 2016 at [place]  . Hiccups   . Hypertension   . Nausea and vomiting    chronic, recurrent  . Type 2 diabetes mellitus with complications (HCC)    diagnosed around age 55    Past Surgical History:  Procedure Laterality Date  . ESOPHAGOGASTRODUODENOSCOPY  2015   Dr. Britta Mccreedy  . EYE SURGERY  2015   stent placed to left eye  . EYE SURGERY    Additional surgical history that did not populate from epic: Cataracts Oct and Nov 2017 EGD March 2016:  Distal esophagititis-likely reflux related although an element of pill induced injuruy no exclueded  status post biopsy. Diffusely abnormal gastric mucosa of uncertain signigicane -status post gastric biopsy. Focal area of excoriation in the cardia most consistant with a trauma of heaving. +H.pylori gastritis.  EGD Jan 2017 at [facility]: mild esophagitis, old gastric content, no active bleeding.   Prior to Admission medications   Medication Sig Start Date End Date Taking? Authorizing Provider  amLODipine (NORVASC) 5 MG tablet Take 5 mg by mouth daily.   Yes [provider]  atorvastatin (LIPITOR) 10 MG tablet Take 10 mg  by mouth daily.   Yes [provider]  brimonidine (ALPHAGAN) 0.15 % ophthalmic solution Place 1 drop into both eyes 2 (two) times daily.    Yes [provider]  cetirizine (ZYRTEC) 10 MG tablet Take 10 mg by mouth daily.   Yes [provider]  ciprofloxacin (CIPRO) 500 MG tablet Take 1 tablet (500 mg total) by mouth 2 (two) times daily. 05/25/17  Yes [place] Callas, NP  cyclobenzaprine (FLEXERIL) 10 MG tablet Take 10 mg by mouth at bedtime as needed for muscle spasms.    Yes [provider]  dicyclomine (BENTYL) 10 MG capsule Take 1 capsule (10 mg total) by mouth 4 (four) times daily -  before meals and at bedtime. Patient taking differently: Take 10 mg by mouth 2 (two) times daily.  12/26/16  Yes Annitta Needs, NP  doxycycline (VIBRA-TABS) 100 MG tablet Take 1 tablet (100 mg total) by mouth 2 (two) times daily. 05/25/17  Yes Lebec Callas, NP  hydrochlorothiazide (HYDRODIURIL) 25 MG tablet Take 25 mg by mouth daily.   Yes [provider]  insulin detemir (LEVEMIR) 100 UNIT/ML injection Inject 20 Units into the skin at bedtime.    Yes [provider]  insulin NPH-regular Human (NOVOLIN 70/30) (70-30) 100 UNIT/ML injection Inject 35 Units into the skin 2 (two) times daily.   Yes [provider]  lisinopril (PRINIVIL,ZESTRIL) 40 MG tablet Take 40 mg by mouth daily.   Yes [provider]  LORazepam (ATIVAN) 0.5 MG tablet Take 0.5 mg by mouth at bedtime as needed for anxiety.   Yes [provider]  metoprolol succinate (TOPROL-XL) 50 MG 24 hr tablet Take 50 mg by mouth daily. Take with or immediately following a meal.   Yes [provider]  pantoprazole (PROTONIX) 40 MG tablet TAKE 1 TABLET BY MOUTH TWICE DAILY BEFORE  A  MEAL 03/23/17  Yes Mahala Menghini, PA-C  sertraline (ZOLOFT) 100 MG tablet Take 100 mg by mouth daily.   Yes [provider]  sitaGLIPtin (JANUVIA) 100 MG tablet Take 100 mg by mouth daily.   Yes [provider]  sucralfate (CARAFATE) 1 g tablet TAKE ONE TABLET BY MOUTH 4 TIMES DAILY 03/12/16  Yes Annitta Needs, NP  metoCLOPramide (REGLAN) 10 MG tablet Take 1 tablet (10 mg total) by mouth every 6 (six) hours as needed for nausea (nausea/headache). Patient not taking: Reported on 06/10/2017 02/21/17   Milton Ferguson, MD    Current Facility-Administered Medications  Medication Dose Route Frequency Provider Last Rate Last Dose  . 0.9 %  sodium chloride infusion   Intravenous Continuous  Eber Jones, MD 100 mL/hr at 06/15/17 1133    . acetaminophen (TYLENOL) tablet 650 mg  650 mg Oral Q6H PRN Eber Jones, MD       Or  . acetaminophen (TYLENOL) suppository 650 mg  650 mg Rectal Q6H PRN Eber Jones, MD      . amLODipine (NORVASC) tablet 5 mg  5 mg Oral Daily Eber Jones, MD   5 mg at 06/15/17 1049  . atorvastatin (LIPITOR) tablet 10 mg  10 mg Oral Daily Eber Jones, MD   10 mg at 06/15/17 1047  . chlorproMAZINE (THORAZINE) tablet 25 mg  25 mg Oral TID Eber Jones, MD   25 mg at 06/15/17 1047  . ciprofloxacin (CIPRO) IVPB 400 mg  400 mg Intravenous Q12H Eber Jones, MD   Stopped at 06/15/17 [phone]  .  cyclobenzaprine (FLEXERIL) tablet 10 mg  10 mg Oral QHS PRN Eber Jones, MD   10 mg at 06/13/17 [phone]  . dicyclomine (BENTYL) capsule 20 mg  20 mg Oral TID AC & HS Eber Jones, MD   20 mg at 06/15/17 1131  . diphenhydrAMINE (BENADRYL) injection 25 mg  25 mg Intravenous Q6H PRN Eber Jones, MD      . doxycycline (VIBRAMYCIN) 100 mg in dextrose 5 % 250 mL IVPB  100 mg Intravenous Q12H Eber Jones, MD   Stopped at 06/15/17 [phone]  . enoxaparin (LOVENOX) injection 40 mg  40 mg Subcutaneous Q24H Ilene Qua U, MD   40 mg at 06/14/17 2015  . hydrALAZINE (APRESOLINE) injection 5 mg  5 mg Intravenous Q4H PRN Eber Jones, MD   5 mg at 06/13/17 0459  . insulin aspart (novoLOG) injection 0-15 Units  0-15 Units Subcutaneous TID WC Eber Jones, MD   5 Units at 06/15/17 1133  . insulin aspart (novoLOG) injection 0-5 Units  0-5 Units Subcutaneous QHS Ilene Qua U, MD      . insulin aspart (novoLOG) injection 3 Units  3 Units Subcutaneous TID WC Eber Jones, MD   3 Units at 06/15/17 1132  . insulin detemir (LEVEMIR) injection 11 Units  11 Units Subcutaneous Daily Eber Jones, MD   11 Units at 06/15/17 1051  . LORazepam (ATIVAN) injection 1 mg  1  mg Intravenous Q4H PRN Eber Jones, MD   1 mg at 06/15/17 0640  . metoCLOPramide (REGLAN) injection 10 mg  10 mg Intravenous Q8H PRN Opyd, Ilene Qua, MD   10 mg at 06/15/17 0758  . metoCLOPramide (REGLAN) injection 5 mg  5 mg Intravenous TID AC & HS Annitta Needs, NP      . OLANZapine zydis (ZYPREXA) disintegrating tablet 10 mg  10 mg Oral Once PRN Opyd, Ilene Qua, MD      . ondansetron (ZOFRAN) injection 4 mg  4 mg Intravenous Q6H Eber Jones, MD   4 mg at 06/15/17 1129   Or  . ondansetron (ZOFRAN) tablet 4 mg  4 mg Oral Q6H Eber Jones, MD   4 mg at 06/15/17 0500  . pantoprazole (PROTONIX) injection 40 mg  40 mg Intravenous Q12H Annitta Needs, NP   40 mg at 06/15/17 1046  . polyethylene glycol (MIRALAX / GLYCOLAX) packet 17 g  17 g Oral Daily Eber Jones, MD   17 g at 06/15/17 1046  . potassium chloride 10 mEq in 100 mL IVPB  10 mEq Intravenous Q1 Hr x 5 Eber Jones, MD   Stopped at 06/15/17 1159  . sertraline (ZOLOFT) tablet 100 mg  100 mg Oral Daily Eber Jones, MD   100 mg at 06/15/17 1048  . sucralfate (CARAFATE) tablet 1 g  1 g Oral QID Eber Jones, MD   1 g at 06/15/17 1049    Allergies as of 06/10/2017 - Review Complete 06/10/2017  Allergen Reaction Noted  . Donnatal [pb-hyoscy-atropine-scopol er] Anaphylaxis 07/12/2015  . Fish allergy Anaphylaxis, Hives, and Rash 01/14/2015  . Haldol [haloperidol lactate] Other (See Comments) 08/07/2015  . Lidocaine Anaphylaxis 07/12/2015  . Maalox [calcium carbonate antacid] Anaphylaxis 07/12/2015  . Aspirin Hives and Itching 03/22/2009  . Bactrim [sulfamethoxazole-trimethoprim] Itching 12/06/2014  . Penicillins Hives and Itching 03/22/2009  . Phenergan [promethazine hcl] Nausea And Vomiting 10/27/2014    Family History  Problem Relation Age of Onset  .  Ovarian cancer Mother   . Cervical cancer Sister   . Diabetes Sister   . Colon cancer Neg Hx     Social History    Socioeconomic History  . Marital status: Single    Spouse name: Not on file  . Number of children: Not on file  . Years of education: Not on file  . Highest education level: Not on file  Social Needs  . Financial resource strain: Not on file  . Food insecurity - worry: Not on file  . Food insecurity - inability: Not on file  . Transportation needs - medical: Not on file  . Transportation needs - non-medical: Not on file  Occupational History  . Not on file  Tobacco Use  . Smoking status: Never Smoker  . Smokeless tobacco: Never Used  Substance and Sexual Activity  . Alcohol use: No    Alcohol/week: 0.0 oz  . Drug use: No  . Sexual activity: Yes  Other Topics Concern  . Not on file  Social History Narrative  . Not on file    Review of Systems: Gen: Denies fever, chills, loss of appetite, change in weight or weight loss CV: Denies chest pain, heart palpitations, syncope, edema  Resp: Denies shortness of breath with rest, cough, wheezing GI: see HPI  GU : Denies urinary burning, urinary frequency, urinary incontinence.  MS: Denies joint pain,swelling, cramping Derm: Denies rash, itching, dry skin Psych: see HPI  Heme: see HPI   Physical Exam: Vital signs in last 24 hours: Temp:  [98.7 F (37.1 C)-99 F (37.2 C)] 98.7 F (37.1 C) (11/05 0555) Pulse Rate:  [90-93] 90 (11/05 0555) Resp:  [18-20] 18 (11/05 0555) BP: (153-201)/(76-91) 173/76 (11/05 1048) SpO2:  [96 %-100 %] 97 % (11/05 0555) Last BM Date: 06/12/17 General:   Alert,  Well-developed, well-nourished, pleasant and cooperative. Persistent hiccups Head:  Normocephalic and atraumatic. Eyes:  Sclera clear, no icterus.    Ears:  Normal auditory acuity. Nose:  No deformity, discharge,  or lesions. Mouth:  No deformity or lesions Lungs:  Clear throughout to auscultation.   Heart:  S1 S2 present without murmurs  Abdomen:  Soft, obese, TTP epigastric. No rebound or guarding.  No masses, hepatosplenomegaly or  hernias noted. Normal bowel sounds Rectal:  Deferred until time of colonoscopy.   Msk:  Symmetrical without gross deformities. Normal posture. Extremities:  Without edema. Neurologic:  Alert and  oriented x4 Psych:  Alert and cooperative. Normal mood and affect.  Intake/Output from previous day: 11/04 0701 - 11/05 0700 In: 650 [IV Piggyback:650] Out: 1125 [Urine:1125] Intake/Output this shift: Total I/O In: 0  Out: 600 [Urine:600]  Lab Results: Recent Labs    06/15/17 0417  WBC 11.1*  HGB 11.7*  HCT 34.0*  PLT 142*   BMET Recent Labs    06/13/17 0852 06/14/17 0930 06/15/17 0417  NA 137 134* 135  K 3.2* 3.0* 2.8*  CL 99* 98* 97*  CO2 27 25 29   GLUCOSE 248* 273* 190*  BUN 20 21* 23*  CREATININE 1.61* 1.51* 1.81*  CALCIUM 8.8* 8.5* 8.2*     Impression: 46 year old male well known to [place] from prior evaluations, presenting with acute on chronic abdominal pain, nausea, and vomiting. Historically treated for suspected delayed gastric emptying in setting of diabetes. Although last A1c was 6.7 in October, 2018, his blood sugars are consistently above 200. Also dealing with osteomyelitis requiring Cipro and Doxycycline IV. Epigastric pain would not be typical with gastroparesis. Last EGD  by [facility] in Jan 2017 with mild esophagitis. History of H.pylori gastritis in 2016, but we have documented H.pylori eradication on file from care everywhere (stool antigen). N/V could be multifactorial in setting of chronic antibiotics, delayed gastric emptying.  Will decrease diet to soft foods, increase Protonix to BID, make Reglan scheduled instead of prn, continue Zofran, and make NPO after midnight. If he does not have significant improvement, would consider EGD this admission. Although gallbladder remains in situ, less likely biliary in origin. HIDA scan on file from 2016 with EF of 92%.   Chronic normocytic anemia: will order anemia panel. Likely chronic disease. Mild thrombocytopenia  chronically noted in setting of acute illness. Continue to monitor. No evidence of overt GI bleeding. At some point, he will need first ever colonoscopy.   Plan: Increase Protonix to BID Add Reglan QID for short course Continue Zofran scheduled Check anemia panel Soft diet Will make NPO after midnight and reassess tomorrow Update HFP today. Lipase has remained normal on multiple occasions Continue supportive measures   Annitta Needs, PhD, ANP-BC [facility]       LOS: 4 days    06/15/2017, 12:42 PM

## 2017-06-16 ENCOUNTER — Telehealth: Payer: Self-pay | Admitting: Gastroenterology

## 2017-06-16 LAB — BASIC METABOLIC PANEL
Anion gap: 9 (ref 5–15)
BUN: 32 mg/dL — ABNORMAL HIGH (ref 6–20)
CO2: 25 mmol/L (ref 22–32)
Calcium: 7.9 mg/dL — ABNORMAL LOW (ref 8.9–10.3)
Chloride: 95 mmol/L — ABNORMAL LOW (ref 101–111)
Creatinine, Ser: 2.71 mg/dL — ABNORMAL HIGH (ref 0.61–1.24)
GFR calc Af Amer: 31 mL/min — ABNORMAL LOW (ref >60)
GFR calc non Af Amer: 26 mL/min — ABNORMAL LOW (ref >60)
Glucose, Bld: 244 mg/dL — ABNORMAL HIGH (ref 65–99)
Potassium: 3 mmol/L — ABNORMAL LOW (ref 3.5–5.1)
Sodium: 129 mmol/L — ABNORMAL LOW (ref 135–145)

## 2017-06-16 LAB — GLUCOSE, CAPILLARY
Glucose-Capillary: 182 mg/dL — ABNORMAL HIGH (ref 65–99)
Glucose-Capillary: 195 mg/dL — ABNORMAL HIGH (ref 65–99)
Glucose-Capillary: 137 mg/dL — ABNORMAL HIGH (ref 65–99)

## 2017-06-16 MED ORDER — POTASSIUM CHLORIDE CRYS ER 20 MEQ PO TBCR
40.0000 meq | EXTENDED_RELEASE_TABLET | Freq: Two times a day (BID) | ORAL | Status: DC
  Administered 2017-06-16: 40 meq via ORAL
  Filled 2017-06-16: qty 2

## 2017-06-16 MED ORDER — METOCLOPRAMIDE HCL 10 MG PO TABS: 10.0000 mg | ORAL_TABLET | Freq: Four times a day (QID) | ORAL | 1 refills | Status: DC | PRN

## 2017-06-16 MED ORDER — POTASSIUM CHLORIDE 10 MEQ/100ML IV SOLN
10.0000 meq | INTRAVENOUS | Status: AC
  Administered 2017-06-16 (×3): 10 meq via INTRAVENOUS
  Filled 2017-06-16 (×3): qty 100

## 2017-06-16 MED ORDER — ONDANSETRON HCL 4 MG PO TABS: 4.0000 mg | ORAL_TABLET | Freq: Four times a day (QID) | ORAL | 0 refills | Status: DC

## 2017-06-16 MED ORDER — POTASSIUM CHLORIDE CRYS ER 20 MEQ PO TBCR: 40.0000 meq | EXTENDED_RELEASE_TABLET | Freq: Two times a day (BID) | ORAL | 0 refills | Status: DC

## 2017-06-16 NOTE — Care Management Note (Signed)
Case Management Note  Patient Details  Name: Timothy Clark MRN: [id number] Date of Birth: 1970-12-29  Expected Discharge Date:  06/16/17               Expected Discharge Plan:  Home/Self Care  In-House Referral:  NA  Discharge planning Services  CM Consult  Post Acute Care Choice:  NA Choice offered to:  NA  Status of Service:  Completed, signed off  If discussed at [place] Length of Stay Meetings, dates discussed:  06/16/2017  Additional Comments: DC home today with self care. Continue OP wound care. Pt family at bedside for DC home. Pt communicates no further needs or concerns about transition home.   Sherald Barge, RN 06/16/2017, 1:51 PM

## 2017-06-16 NOTE — Telephone Encounter (Signed)
Please arrange outpatient follow-up, non-urgent, in next 6 weeks or so. We saw him as inpatient. Hospital follow-up.

## 2017-06-16 NOTE — Progress Notes (Signed)
    Subjective: Feels much improved. States N/V resolved. Abdominal pain still present but much improved. Tells me he takes ativan every morning, which helps his nerves. He notes that prior to admission, he had missed several days of this as he had taken pills over the sink and "dropped a lot of them". He had to wait to see PCP, so he was without the ativan for 2 days.   Objective: Vital signs in last 24 hours: Temp:  [98 F (36.7 C)-99.1 F (37.3 C)] 98 F (36.7 C) (11/06 0701) Pulse Rate:  [74-95] 74 (11/06 0701) Resp:  [10-18] 10 (11/06 0701) BP: (90-173)/(54-96) 90/54 (11/06 0701) SpO2:  [96 %-100 %] 98 % (11/06 0701) Last BM Date: 06/12/17 General:   Alert and oriented, pleasant Head:  Normocephalic and atraumatic. Abdomen:  Bowel sounds present, soft, non-tender, non-distended. No HSM or hernias noted. No rebound or guarding. No masses appreciated  Msk:  Symmetrical without gross deformities. Normal posture. Neurologic:  Alert and  oriented x4 Psych:  Alert and cooperative. Normal mood and affect.  Intake/Output from previous day: 11/05 0701 - 11/06 0700 In: 3095 [P.O.:240; I.V.:1705; IV Piggyback:1150] Out: 600 [Urine:600] Intake/Output this shift: No intake/output data recorded.  Lab Results: Recent Labs    06/15/17 0417  WBC 11.1*  HGB 11.7*  HCT 34.0*  PLT 142*   BMET Recent Labs    06/14/17 0930 06/15/17 0417  NA 134* 135  K 3.0* 2.8*  CL 98* 97*  CO2 25 29  GLUCOSE 273* 190*  BUN 21* 23*  CREATININE 1.51* 1.81*  CALCIUM 8.5* 8.2*   LFT Recent Labs    06/15/17 1308  PROT 7.4  ALBUMIN 3.5  AST 23  ALT 16*  ALKPHOS 84  BILITOT 1.1  BILIDIR 0.2  IBILI 0.9    Assessment: 46 year old male well known to [place] from prior evaluations, presenting with acute on chronic abdominal pain, nausea, and vomiting. Historically treated for suspected delayed gastric emptying in setting of diabetes. Last A1c 6.7 in October 2018. Also dealing with osteomyelitis  requiring Cipro and Doxycycline IV. Much improved today with resolution of N/V and improved abdominal pain on PPI BID, Reglan scheduled QID, and Zofran scheduled. Will advance to full liquids and then as tolerated.   Interestingly, he tells me that he has significant anxiety. He missed several doses of Ativan prior to admission; he notes that when he takes this, his symptoms are controlled.   Chronic normocytic anemia: Likely chronic disease. Mild thrombocytopenia chronically noted in setting of acute illness. Continue to monitor. No evidence of overt GI bleeding. At some point, he will need first ever colonoscopy.   Plan: Continue Protonix BID Reglan QID Zofran scheduled Will order full liquids now and advance as tolerated Will arrange outpatient follow-up with [place] No need for EGD right now unless clinical condition changes  Annitta Needs, PhD, ANP-BC [facility]enter[facility]    LOS: 5 days    06/16/2017, 8:26 AM

## 2017-06-16 NOTE — Discharge Summary (Signed)
Physician Discharge Summary  GERREN HOFFMEIER WUJ:[id number] DOB: 05/04/1971 DOA: 06/10/2017  PCP: Tawni Carnes, PA-C  Admit date: 06/10/2017 Discharge date: 06/16/2017  Admitted From: Home Disposition:  Home   Recommendations for Outpatient Follow-up:  1. Follow up with PCP in 1-2 weeks 2. Schedule follow up with GI within the next month 3. Take medications as prescribed 4. Please obtain BMP/CBC in one week   Home Health: No  Equipment/Devices: None  Discharge Condition: Stable CODE STATUS: Full Code  Diet recommendation: Heart healthy carb modified  Brief/Interim Summary: Timothy Labarge Hamlinis a 46 y.o.malewith medical history significant of gastritis, gastroparesis, GERD, Glaucoma, HTN, Type II DM with complications that presents with nausea and vomiting for which patient has a history of. Per patient nausea and vomiting began yesterday. Having cramping abdominal pain. Unable to take usual medications due to his nausea and vomiting. Has had 5 ED visits in the past 6 months for these symptoms. Denies fevers, chills, chest pressure, shortness of breath. Patient reports that he has had about 10 episodes of emesis since yesterday. Denies blood in emesis. Patient reports chest pain that feels like a cramp that is accompanying dry heaves and vomiting. States emesis at this time is mostly mucuous and is mostly just dry heaving. Denies any sick contacts.   ED Course:Seen by Dr. Thurnell Garbe who ordered troponin (<0.03), UA (with small Hgb, >300 glucose, negative LE and nitrite), Cr of 1.33 (around baseline). Was given reglan, benadryl, zofran, ativan, bentyl, pepcid, fentanyl without relief. [facility] asked to admit for intractable nausea and vomiting.   Patient was continually monitored on medications to help control his nausea and vomiting.  Of note, since admission, no providers have witnessed patient have any episodes of emesis.  He continued to voice that he was extremely nauseous  and had numerous episodes of emesis throughout admission.  He repeatedly asked for pain medication.  He failed to improve in terms of his nausea and "vomiting" and so gastroenterology was consulted.  He was found to also be hypokalemic during his admission and potassium was replaced both IV and PO. Patient was started on schedule reglan.  He improved and tolerated a diet on 11/6 and was stable for discharge.  He was instructed to follow up with GI outpatient.      Discharge Diagnoses:  Principal Problem:   Intractable nausea and vomiting Active Problems:   Diabetes mellitus type 2 with complications, uncontrolled (HCC)   UNSPECIFIED PERIPHERAL VASCULAR DISEASE   DIABETIC FOOT ULCER with Osteomyelitis   Essential hypertension   Gastroparesis due to DM ([place])   AKI (acute kidney injury) [facility])    Discharge Instructions  Discharge Instructions    Call MD for:  difficulty breathing, headache or visual disturbances   Complete by:  As directed    Call MD for:  extreme fatigue   Complete by:  As directed    Call MD for:  hives   Complete by:  As directed    Call MD for:  persistant dizziness or light-headedness   Complete by:  As directed    Call MD for:  persistant nausea and vomiting   Complete by:  As directed    Call MD for:  redness, tenderness, or signs of infection (pain, swelling, redness, odor or green/yellow discharge around incision site)   Complete by:  As directed    Call MD for:  severe uncontrolled pain   Complete by:  As directed    Call MD for:  temperature >100.4  Complete by:  As directed    Diet - low sodium heart healthy   Complete by:  As directed    Diet Carb Modified   Complete by:  As directed    Discharge instructions   Complete by:  As directed    Follow up with GI outpatient   Discharge instructions   Complete by:  As directed    Follow up with GI Take medications as prescribed F/u with hyperbaric oxygen wound care and with ID for further treatment  of your infection   Increase activity slowly   Complete by:  As directed      Allergies as of 06/16/2017      Reactions   Donnatal [pb-hyoscy-atropine-scopol Er] Anaphylaxis   Reaction to GI cocktail   Fish Allergy Anaphylaxis, Hives, Rash   Haldol [haloperidol Lactate] Other (See Comments)   Chest Pain   Lidocaine Anaphylaxis   Reaction to GI cocktail   Maalox [calcium Carbonate Antacid] Anaphylaxis   Reaction to GI cocktail   Aspirin Hives, Itching   Bactrim [sulfamethoxazole-trimethoprim] Itching   Penicillins Hives, Itching   Has patient had a PCN reaction causing immediate rash, facial/tongue/throat swelling, SOB or lightheadedness with hypotension: yes Has patient had a PCN reaction causing severe rash involving mucus membranes or skin necrosis: yes Has patient had a PCN reaction that required hospitalization no Has patient had a PCN reaction occurring within the last 10 years: yes If all of the above answers are "NO", then may proceed with Cephalospor   Phenergan [promethazine Hcl] Nausea And Vomiting      Medication List    TAKE these medications   amLODipine 5 MG tablet Commonly known as:  NORVASC Take 5 mg by mouth daily.   atorvastatin 10 MG tablet Commonly known as:  LIPITOR Take 10 mg by mouth daily.   brimonidine 0.15 % ophthalmic solution Commonly known as:  ALPHAGAN Place 1 drop into both eyes 2 (two) times daily.   cetirizine 10 MG tablet Commonly known as:  ZYRTEC Take 10 mg by mouth daily.   ciprofloxacin 500 MG tablet Commonly known as:  CIPRO Take 1 tablet (500 mg total) by mouth 2 (two) times daily.   cyclobenzaprine 10 MG tablet Commonly known as:  FLEXERIL Take 10 mg by mouth at bedtime as needed for muscle spasms.   dicyclomine 10 MG capsule Commonly known as:  BENTYL Take 1 capsule (10 mg total) by mouth 4 (four) times daily -  before meals and at bedtime. What changed:  when to take this   doxycycline 100 MG tablet Commonly known  as:  VIBRA-TABS Take 1 tablet (100 mg total) by mouth 2 (two) times daily.   hydrochlorothiazide 25 MG tablet Commonly known as:  HYDRODIURIL Take 25 mg by mouth daily.   insulin detemir 100 UNIT/ML injection Commonly known as:  LEVEMIR Inject 20 Units into the skin at bedtime.   insulin NPH-regular Human (70-30) 100 UNIT/ML injection Commonly known as:  NOVOLIN 70/30 Inject 35 Units into the skin 2 (two) times daily.   JANUVIA 100 MG tablet Generic drug:  sitaGLIPtin Take 100 mg by mouth daily.   lisinopril 40 MG tablet Commonly known as:  PRINIVIL,ZESTRIL Take 40 mg by mouth daily.   LORazepam 0.5 MG tablet Commonly known as:  ATIVAN Take 0.5 mg by mouth at bedtime as needed for anxiety.   metoCLOPramide 10 MG tablet Commonly known as:  REGLAN Take 1 tablet (10 mg total) every 6 (six) hours as needed  by mouth for nausea or refractory nausea / vomiting (nausea/headache). What changed:  reasons to take this   metoprolol succinate 50 MG 24 hr tablet Commonly known as:  TOPROL-XL Take 50 mg by mouth daily. Take with or immediately following a meal.   ondansetron 4 MG tablet Commonly known as:  ZOFRAN Take 1 tablet (4 mg total) every 6 (six) hours by mouth.   pantoprazole 40 MG tablet Commonly known as:  PROTONIX TAKE 1 TABLET BY MOUTH TWICE DAILY BEFORE  A  MEAL   potassium chloride SA 20 MEQ tablet Commonly known as:  K-DUR,KLOR-CON Take 2 tablets (40 mEq total) 2 (two) times daily by mouth.   sertraline 100 MG tablet Commonly known as:  ZOLOFT Take 100 mg by mouth daily.   sucralfate 1 g tablet Commonly known as:  CARAFATE TAKE ONE TABLET BY MOUTH 4 TIMES DAILY      Follow-up Information    Tawni Carnes, PA-C. Schedule an appointment as soon as possible for a visit in 2 week(s).   Specialty:  Physician Assistant Contact information: [place] [id number]        Annitta Needs, NP. Schedule an appointment as soon as possible for a visit  in 1 month(s).   Specialty:  Gastroenterology Contact information: [place] [address] [phone]          Allergies  Allergen Reactions  . Donnatal [Pb-Hyoscy-Atropine-Scopol Er] Anaphylaxis    Reaction to GI cocktail  . Fish Allergy Anaphylaxis, Hives and Rash  . Haldol [Haloperidol Lactate] Other (See Comments)    Chest Pain  . Lidocaine Anaphylaxis    Reaction to GI cocktail  . Maalox [Calcium Carbonate Antacid] Anaphylaxis    Reaction to GI cocktail  . Aspirin Hives and Itching  . Bactrim [Sulfamethoxazole-Trimethoprim] Itching  . Penicillins Hives and Itching    Has patient had a PCN reaction causing immediate rash, facial/tongue/throat swelling, SOB or lightheadedness with hypotension: yes Has patient had a PCN reaction causing severe rash involving mucus membranes or skin necrosis: yes Has patient had a PCN reaction that required hospitalization no Has patient had a PCN reaction occurring within the last 10 years: yes If all of the above answers are "NO", then may proceed with Cephalospor  . Phenergan [Promethazine Hcl] Nausea And Vomiting    Consultations:  Gastroenterolgy   Procedures/Studies: Dg Abd 1 View  Result Date: 06/12/2017 CLINICAL DATA:  46 year old male with intractable nausea vomiting. EXAM: ABDOMEN - 1 VIEW COMPARISON:  Abdominal radiograph dated 06/10/2017 FINDINGS: There is moderate colonic stool burden. No bowel dilatation or evidence of obstruction. No free air noted on the provided images. No radiopaque calculi. The osseous structures and soft tissues are grossly unremarkable. Partially visualized hazy densities over the right lung base may represent atelectasis versus effusion. Clinical correlation is recommended. IMPRESSION: Moderate colonic stool burden.  No bowel obstruction. Probable right lung base atelectasis/infiltrate and small pleural effusion. Clinical correlation is recommended. Electronically Signed   By: Anner Crete M.D.   On: 06/12/2017 19:54   Dg Abd Acute W/chest  Result Date: 06/10/2017 CLINICAL DATA:  46 year old male with nausea vomiting and abdominal pain since yesterday. EXAM: DG ABDOMEN ACUTE W/ 1V CHEST COMPARISON:  02/10/2017 and earlier. FINDINGS: Seated upright AP view of the chest. Lordotic positioning. Mediastinal contours and lung parenchyma suspected to be stable. Visualized tracheal air column is within normal limits. No pneumoperitoneum identified. Non obstructed bowel gas pattern. Abdominal and pelvic visceral  contours are within normal limits. No acute osseous abnormality identified. Chronic right lateral L4-L5 endplate degeneration. IMPRESSION: 1.  Normal bowel gas pattern, no free air. 2.  No acute cardiopulmonary abnormality. Electronically Signed   By: Genevie Ann M.D.   On: 06/10/2017 15:36     Subjective: Patient feeling very well this am.  Says that his nausea and vomiting have resolved.  Asking to discharge.  Understands he needs follow up with GI.  Discharge Exam: Vitals:   06/16/17 0701 06/16/17 1014  BP: (!) 90/54 117/72  Pulse: 74 91  Resp: 10   Temp: 98 F (36.7 C)   SpO2: 98%    Vitals:   06/15/17 1423 06/15/17 2048 06/16/17 0701 06/16/17 1014  BP: (!) 152/96 99/61 (!) 90/54 117/72  Pulse: 95 89 74 91  Resp: 18 18 10    Temp:  98.1 F (36.7 C) 98 F (36.7 C)   TempSrc:  Oral Oral   SpO2: 100% 96% 98%   Weight:      Height:        General: Pt is alert, awake, not in acute distress Cardiovascular: RRR, S1/S2 +, no rubs, no gallops Respiratory: CTA bilaterally, no wheezing, no rhonchi Abdominal: Soft, NT, ND, bowel sounds + Extremities: edema of lower extremities bilaterally, compression stockings up to knee bilaterally, right foot with dressing, no cyanosis    The results of significant diagnostics from this hospitalization (including imaging, microbiology, ancillary and laboratory) are listed below for reference.     Microbiology: Recent  Results (from the past 240 hour(s))  MRSA PCR Screening     Status: None   Collection Time: 06/10/17  5:27 PM  Result Value Ref Range Status   MRSA by PCR NEGATIVE NEGATIVE Final    Comment:        The GeneXpert MRSA Assay (FDA approved for NASAL specimens only), is one component of a comprehensive MRSA colonization surveillance program. It is not intended to diagnose MRSA infection nor to guide or monitor treatment for MRSA infections.      Labs: BNP (last 3 results) No results for input(s): BNP in the last 8760 hours. Basic Metabolic Panel: Recent Labs  Lab 06/12/17 0449 06/13/17 0852 06/14/17 0930 06/15/17 0417 06/16/17 1315  NA 136 137 134* 135 129*  K 3.1* 3.2* 3.0* 2.8* 3.0*  CL 100* 99* 98* 97* 95*  CO2 26 27 25 29 25   GLUCOSE 169* 248* 273* 190* 244*  BUN 18 20 21* 23* 32*  CREATININE 1.42* 1.61* 1.51* 1.81* 2.71*  CALCIUM 8.5* 8.8* 8.5* 8.2* 7.9*   Liver Function Tests: Recent Labs  Lab 06/10/17 1145 06/15/17 1308  AST 23 23  ALT 26 16*  ALKPHOS 70 57  BILITOT 1.1 1.1  PROT 7.6 7.4  ALBUMIN 3.8 3.5   Recent Labs  Lab 06/10/17 1145  LIPASE 26   No results for input(s): AMMONIA in the last 168 hours. CBC: Recent Labs  Lab 06/10/17 1145 06/15/17 0417  WBC 9.1 11.1*  NEUTROABS  --  7.4  HGB 12.4* 11.7*  HCT 35.3* 34.0*  MCV 87.8 89.2  PLT 140* 142*   Cardiac Enzymes: Recent Labs  Lab 06/10/17 2353 06/11/17 0601 06/12/17 0604 06/12/17 1205 06/12/17 1856  TROPONINI <0.03 <0.03 <0.03 <0.03 <0.03   BNP: Invalid input(s): POCBNP CBG: Recent Labs  Lab 06/15/17 1622 06/15/17 2052 06/15/17 2201 06/16/17 0733 06/16/17 1123  GLUCAP 149* 183* 165* 182* 195*   D-Dimer No results for input(s): DDIMER in the last  72 hours. Hgb A1c No results for input(s): HGBA1C in the last 72 hours. Lipid Profile No results for input(s): CHOL, HDL, LDLCALC, TRIG, CHOLHDL, LDLDIRECT in the last 72 hours. Thyroid function studies No results for  input(s): TSH, T4TOTAL, T3FREE, THYROIDAB in the last 72 hours.  Invalid input(s): FREET3 Anemia work up Recent Labs    06/15/17 1308  VITAMINB12 205  FOLATE 13.0  FERRITIN 344*  TIBC 203*  IRON 44*  RETICCTPCT 3.5*   Urinalysis    Component Value Date/Time   COLORURINE YELLOW 06/10/2017 1140   APPEARANCEUR CLEAR 06/10/2017 1140   LABSPEC 1.015 06/10/2017 1140   PHURINE 7.0 06/10/2017 1140   GLUCOSEU 150 (A) 06/10/2017 1140   HGBUR SMALL (A) 06/10/2017 1140   BILIRUBINUR NEGATIVE 06/10/2017 1140   [place] 06/10/2017 1140   PROTEINUR >=300 (A) 06/10/2017 1140   UROBILINOGEN 0.2 06/07/2015 1353   NITRITE NEGATIVE 06/10/2017 1140   LEUKOCYTESUR NEGATIVE 06/10/2017 1140   Sepsis Labs Invalid input(s): PROCALCITONIN,  WBC,  LACTICIDVEN Microbiology Recent Results (from the past 240 hour(s))  MRSA PCR Screening     Status: None   Collection Time: 06/10/17  5:27 PM  Result Value Ref Range Status   MRSA by PCR NEGATIVE NEGATIVE Final    Comment:        The GeneXpert MRSA Assay (FDA approved for NASAL specimens only), is one component of a comprehensive MRSA colonization surveillance program. It is not intended to diagnose MRSA infection nor to guide or monitor treatment for MRSA infections.      Time coordinating discharge: 35 minutes  SIGNED:   Loretha Stapler, MD  [facility] 06/16/2017, 2:44 PM Pager [phone] If 7PM-7AM, please contact night-coverage [email or web address] Password [email or web address]

## 2017-06-16 NOTE — Care Management Important Message (Signed)
Important Message  Patient Details  Name: Timothy Clark MRN: [id number] Date of Birth: Mar 26, 1971   Medicare Important Message Given:  Yes    Sherald Barge, RN 06/16/2017, 1:51 PM

## 2017-06-16 NOTE — Consult Note (Signed)
[facility] nurse consulted for wound care. Patient has been inpatient with foot wound since 06/10/17.  He is being treated outpatient by [facility]. Verified POC with center.  He has HBO tx T/Wed/Th and he is followed by infectious disease for osteomyelitis. Plans made for [facility] nurse to perform remote telehealth consultation, however when I have contacted the bedside nurse at the scheduled time patient has DC orders.  I have instructed nurse to make sure patient keeps his scheduled HBO tx and wound care evaluation tomorrow as scheduled. [facility] nurse will not consult for this reason.     Thanks  Soniyah Mcglory [employer], RN,CWOCN, CNS, [place] [phone])

## 2017-06-17 ENCOUNTER — Ambulatory Visit: Payer: Medicare Other | Admitting: Podiatry

## 2017-06-18 ENCOUNTER — Encounter: Payer: Self-pay | Admitting: Gastroenterology

## 2017-06-18 NOTE — Telephone Encounter (Signed)
Patient scheduled.

## 2017-06-22 ENCOUNTER — Ambulatory Visit (INDEPENDENT_AMBULATORY_CARE_PROVIDER_SITE_OTHER): Payer: Medicare Other | Admitting: Infectious Diseases

## 2017-06-22 ENCOUNTER — Encounter: Payer: Self-pay | Admitting: Infectious Diseases

## 2017-06-22 ENCOUNTER — Ambulatory Visit (INDEPENDENT_AMBULATORY_CARE_PROVIDER_SITE_OTHER): Payer: Medicare Other | Admitting: Podiatry

## 2017-06-22 VITALS — BP 162/81 | HR 85 | Temp 98.6°F | Wt 292.0 lb

## 2017-06-22 DIAGNOSIS — E1143 Type 2 diabetes mellitus with diabetic autonomic (poly)neuropathy: Secondary | ICD-10-CM

## 2017-06-22 DIAGNOSIS — E0842 Diabetes mellitus due to underlying condition with diabetic polyneuropathy: Secondary | ICD-10-CM

## 2017-06-22 DIAGNOSIS — L97512 Non-pressure chronic ulcer of other part of right foot with fat layer exposed: Secondary | ICD-10-CM

## 2017-06-22 DIAGNOSIS — I70235 Atherosclerosis of native arteries of right leg with ulceration of other part of foot: Secondary | ICD-10-CM

## 2017-06-22 DIAGNOSIS — K3184 Gastroparesis: Secondary | ICD-10-CM | POA: Diagnosis not present

## 2017-06-22 DIAGNOSIS — T801XXA Vascular complications following infusion, transfusion and therapeutic injection, initial encounter: Secondary | ICD-10-CM | POA: Diagnosis not present

## 2017-06-22 DIAGNOSIS — L97414 Non-pressure chronic ulcer of right heel and midfoot with necrosis of bone: Secondary | ICD-10-CM | POA: Diagnosis not present

## 2017-06-22 MED ORDER — CLINDAMYCIN HCL 300 MG PO CAPS: 300.0000 mg | ORAL_CAPSULE | Freq: Three times a day (TID) | ORAL | 1 refills | Status: AC

## 2017-06-22 NOTE — Progress Notes (Signed)
Patient Name: Timothy Clark  MRN: [id number]  DOB: [birth year]    Reason for Visit: Osteomyelitis   Referring Provider: Zada Girt ([place])  INFECTIOUS DISEASE OFFICE FOLLOW UP SUBJECTIVE:  HPI: Timothy Clark is a 46 y.o. male diabetic patient who is here for follow up on osteomyelitis of his right forefoot/#1MTP.  He has had a chronic ulcer involving the superior right hallux since approx March 2018; previously with ulcer to ball of his foot about a year ago that is now healed. He has in the past been on IV antibiotics ~10-14 days duration earlier 2018 at [facility]). Recent Hgb A1c per office records was 9.1%. Blood flow in the legs normal with previous ABIs. He is ambulatory, although spends a fair amount of time sitting with feet up in compression stockings. Goes to wound care 2-3x a week.   Currently on day 28 for empiric treatment of his osteomyelitis. Recently underwent a week long hospitalization at [facility] for intractable nausea and vomiting r/t his gastroparesis. Received Doxycycline and Ciprofloxacin IV while inpatient. Has not had doxycycline since Wednesday as he was told not to continue this in fear it caused his flare. He is no longer doing the hyperbaric oxygen treatments due to the cost. Saw Dr. Amalia Hailey today and had sharp debridement in the office today. He reports no fevers. Blood sugars have been low per his record since discharge home.   Multiple Allergies = Intolerant of Keflex in the past with hives/itching and vomiting just as with PCN. Did well with Doxy IV after nausea was controlled.   PIV Site Pain / Infiltration = Only complaint today is that he has residual pain and swelling at previous IV site to left forearm. No tingling and fingers can move freely but pain with light compression to area on arm and some redness. Reports swelling has improved over time. Tried heat to the site but intolerant of this.       Medication List        Accurate as of 06/22/17  2:55 PM. Always use your most recent med list.          amLODipine 5 MG tablet Commonly known as:  NORVASC   atorvastatin 10 MG tablet Commonly known as:  LIPITOR   brimonidine 0.15 % ophthalmic solution Commonly known as:  ALPHAGAN   cetirizine 10 MG tablet Commonly known as:  ZYRTEC   ciprofloxacin 500 MG tablet Commonly known as:  CIPRO Take 1 tablet (500 mg total) by mouth 2 (two) times daily.   clindamycin 300 MG capsule Commonly known as:  CLEOCIN Take 1 capsule (300 mg total) 3 (three) times daily by mouth.   cyclobenzaprine 10 MG tablet Commonly known as:  FLEXERIL   dicyclomine 10 MG capsule Commonly known as:  BENTYL Take 1 capsule (10 mg total) by mouth 4 (four) times daily -  before meals and at bedtime.   doxycycline 100 MG tablet Commonly known as:  VIBRA-TABS Take 1 tablet (100 mg total) by mouth 2 (two) times daily.   hydrochlorothiazide 25 MG tablet Commonly known as:  HYDRODIURIL   insulin detemir 100 UNIT/ML injection Commonly known as:  LEVEMIR   insulin NPH-regular Human (70-30) 100 UNIT/ML injection Commonly known as:  NOVOLIN 70/30   JANUVIA 100 MG tablet Generic drug:  sitaGLIPtin   lisinopril 40 MG tablet Commonly known as:  PRINIVIL,ZESTRIL   LORazepam 0.5 MG tablet Commonly known as:  ATIVAN  metoCLOPramide 10 MG tablet Commonly known as:  REGLAN Take 1 tablet (10 mg total) every 6 (six) hours as needed by mouth for nausea or refractory nausea / vomiting (nausea/headache).   metoprolol succinate 50 MG 24 hr tablet Commonly known as:  TOPROL-XL   ondansetron 4 MG tablet Commonly known as:  ZOFRAN Take 1 tablet (4 mg total) every 6 (six) hours by mouth.   pantoprazole 40 MG tablet Commonly known as:  PROTONIX TAKE 1 TABLET BY MOUTH TWICE DAILY BEFORE  A  MEAL   potassium chloride SA 20 MEQ tablet Commonly known as:  K-DUR,KLOR-CON Take 2 tablets (40 mEq total) 2  (two) times daily by mouth.   sertraline 100 MG tablet Commonly known as:  ZOLOFT   sucralfate 1 g tablet Commonly known as:  CARAFATE TAKE ONE TABLET BY MOUTH 4 TIMES DAILY       Where to Get Your Medications    These medications were sent to [facility], [address] [place]  [place], [place] [address]   Phone:  [phone]   clindamycin 300 MG capsule     Review of Systems  Constitutional: Negative for chills and fever.  Respiratory: Negative for cough and sputum production.   Cardiovascular: Positive for leg swelling. Negative for chest pain.  Gastrointestinal: Negative for abdominal pain, diarrhea and vomiting.  Genitourinary: Negative for dysuria and flank pain.  Musculoskeletal: Negative for joint pain and myalgias.       Pain and swelling to left lateral upper wrist/forearm area from previous IV site.   Skin: Negative for rash.  Neurological: Negative for dizziness, tingling and headaches.  Endo/Heme/Allergies: Negative for polydipsia.    Past Medical History:  Diagnosis Date  . Chronic abdominal pain   . Esophagitis   . Eye hemorrhage   . Gastritis   . Gastroparesis   . GERD (gastroesophageal reflux disease)   . Glaucoma   . Helicobacter pylori (H. pylori) infection 2016   ERADICATION DOCUMENTED VIA STOOL TEST in AUG 2016 at [place]  . Hiccups   . Hypertension   . Nausea and vomiting    chronic, recurrent  . Type 2 diabetes mellitus with complications (HCC)    diagnosed around age 79    Allergies  Allergen Reactions  . Donnatal [Pb-Hyoscy-Atropine-Scopol Er] Anaphylaxis    Reaction to GI cocktail  . Fish Allergy Anaphylaxis, Hives and Rash  . Haldol [Haloperidol Lactate] Other (See Comments)    Chest Pain  . Lidocaine Anaphylaxis    Reaction to GI cocktail  . Maalox [Calcium Carbonate Antacid] Anaphylaxis    Reaction to GI cocktail  . Aspirin Hives and Itching  . Bactrim [Sulfamethoxazole-Trimethoprim] Itching  . Keflex  [Cephalexin] Hives, Itching and Nausea And Vomiting  . Penicillins Hives and Itching    Has patient had a PCN reaction causing immediate rash, facial/tongue/throat swelling, SOB or lightheadedness with hypotension: yes Has patient had a PCN reaction causing severe rash involving mucus membranes or skin necrosis: yes Has patient had a PCN reaction that required hospitalization no Has patient had a PCN reaction occurring within the last 10 years: yes If all of the above answers are "NO", then may proceed with Cephalospor  . Phenergan [Promethazine Hcl] Nausea And Vomiting    OBJECTIVE: Vitals:   06/22/17 1351  BP: (!) 162/81  Pulse: 85  Temp: 98.6 F (37 C)  TempSrc: Oral  Weight: 292 lb (132.5 kg)   Body mass index is  43.12 kg/m.  Physical Exam  Constitutional: He is oriented to person, place, and time and well-developed, well-nourished, and in no distress.  HENT:  Mouth/Throat: Oropharynx is clear and moist.  Eyes:  Right eye with outward gaze (chronic)  Cardiovascular: Normal rate, normal heart sounds and intact distal pulses.  No murmur heard. Pulmonary/Chest: Effort normal and breath sounds normal.  Abdominal: Soft. Bowel sounds are normal. He exhibits no distension.  Musculoskeletal:  Right Foot - small bleeding noted. Healthy red/pink wound bed without slough or exudate.   Neurological: He is alert and oriented to person, place, and time.  Skin: Skin is warm and dry.  Psychiatric: Affect normal.  Vitals reviewed.   LABS & DIAGNOSTICS:  MRI 02/25/17 Right Forefoot W/O Contrast: Soft tissue ulceration involving the medial aspect of forefoot at the level of the first MTP joint. There is underlying osteomyelitis involving the first metatarsal head. I do not see any definite findings for septic arthritis. Moderate degenerative changes noted. Surrounding cellulitis w/o myofasciitis w/o drainable soft tissue abscess or pyomyositis. The other bony structures appear intact. The  medial and lateral sesamoids of the great toe are intact.  Bone biopsy 04/30/17 - resolving chronic osteomyelitis, no culture.   ASSESSMENT & PLAN:  Problem List Items Addressed This Visit      Cardiovascular and Mediastinum   IV infiltrate    Apparently had [facility] week at [facility]. Pain whenever the nurse would deliver IV push medication. Now with resolving infiltrate with what seems to be some cellulitis/phlebitis. No necrosis or ulcerated areas. Good neurovascular assessment and 2+ radial pulses with good cap refill.   Hopefully this will respond to the clindamycin if there is any infectious component to it; however with worsening pain I have advised him he may need to return to ER for care and instruction as it may require injection to help with complete resolution. Should elevate limb and try heat/ice to the site if he can tolerate it.         Digestive   Gastroparesis due to DM ([place]) - Primary    Apparently flared with Doxycycline as he has been tolerant of Cipro monotherapy over the last week. Will stop Doxycycline and try alternative.         Musculoskeletal and Integument   DIABETIC FOOT ULCER with Osteomyelitis    Will try to add Clindamycin to Cipro. He has multiple drug allergies and intolerances. Other option would be to change to [place] (which he cannot afford and I would like to avoid) or consider linezolid, which is not well tolerated for long term therapy. Hopefully he will tolerate the Clindamycin. Will check ESR/CRP/CBC with Diff and BMET today to monitor on long term antibiotics.  Wound is still not healed since his biopsy 1.5 months ago. Counseled on importance of staying off foot when he can, elevate limb and use compression stockings to limit some of the tissue swelling.   Will have him return in 3-4 weeks.      Relevant Orders   CBC with Differential/Platelet   Sedimentation rate   C-reactive protein   Basic metabolic panel     I spent  25 minutes  with the patient including greater than 50% of time in face to face counsel of the patient re wound care, antibiotic discussion, PIV infiltrate and in coordination of their care.  [name], MSN, [facility]  05/25/2017

## 2017-06-22 NOTE — Patient Instructions (Signed)
Stop Doxycycline.   Continue Ciprofloxacin  Start taking Clindamycin one pill three times a day.   If your arm is not improved with elevation, heat/ice and starting antibiotics would go back to ER for evaluation if pain is still there or worse.   Please come back to see me in 3-4 weeks.

## 2017-06-22 NOTE — Assessment & Plan Note (Signed)
Apparently had PIV in for 1 week at [facility]. Pain whenever the nurse would deliver IV push medication. Now with resolving infiltrate with what seems to be some cellulitis/phlebitis. No necrosis or ulcerated areas. Good neurovascular assessment and 2+ radial pulses with good cap refill.   Hopefully this will respond to the clindamycin if there is any infectious component to it; however with worsening pain I have advised him he may need to return to ER for care and instruction as it may require injection to help with complete resolution. Should elevate limb and try heat/ice to the site if he can tolerate it.

## 2017-06-22 NOTE — Assessment & Plan Note (Signed)
Will try to add Clindamycin to Cipro. He has multiple drug allergies and intolerances. Other option would be to change to Kopperl (which he cannot afford and I would like to avoid) or consider linezolid, which is not well tolerated for long term therapy. Hopefully he will tolerate the Clindamycin. Will check ESR/CRP/CBC with Diff and BMET today to monitor on long term antibiotics.  Wound is still not healed since his biopsy 1.5 months ago. Counseled on importance of staying off foot when he can, elevate limb and use compression stockings to limit some of the tissue swelling.   Will have him return in 3-4 weeks.

## 2017-06-22 NOTE — Assessment & Plan Note (Signed)
Apparently flared with Doxycycline as he has been tolerant of Cipro monotherapy over the last week. Will stop Doxycycline and try alternative.

## 2017-06-23 DIAGNOSIS — E11621 Type 2 diabetes mellitus with foot ulcer: Secondary | ICD-10-CM | POA: Diagnosis not present

## 2017-06-23 DIAGNOSIS — L97519 Non-pressure chronic ulcer of other part of right foot with unspecified severity: Secondary | ICD-10-CM | POA: Diagnosis not present

## 2017-06-23 DIAGNOSIS — E1169 Type 2 diabetes mellitus with other specified complication: Secondary | ICD-10-CM | POA: Diagnosis not present

## 2017-06-23 DIAGNOSIS — M869 Osteomyelitis, unspecified: Secondary | ICD-10-CM | POA: Diagnosis not present

## 2017-06-23 DIAGNOSIS — L97514 Non-pressure chronic ulcer of other part of right foot with necrosis of bone: Secondary | ICD-10-CM | POA: Diagnosis not present

## 2017-06-23 LAB — BASIC METABOLIC PANEL
BUN: 14 mg/dL (ref 7–25)
CO2: 30 mmol/L (ref 20–32)
Calcium: 9.2 mg/dL (ref 8.6–10.3)
Chloride: 102 mmol/L (ref 98–110)
Creat: 1.2 mg/dL (ref 0.60–1.35)
Glucose, Bld: 262 mg/dL — ABNORMAL HIGH (ref 65–99)
Potassium: 4 mmol/L (ref 3.5–5.3)
Sodium: 137 mmol/L (ref 135–146)

## 2017-06-23 LAB — CBC WITH DIFFERENTIAL/PLATELET
Basophils Absolute: 44 cells/uL (ref 0–200)
Basophils Relative: 0.5 %
Eosinophils Absolute: 97 cells/uL (ref 15–500)
Eosinophils Relative: 1.1 %
HCT: 36.4 % — ABNORMAL LOW (ref 38.5–50.0)
Hemoglobin: 12 g/dL — ABNORMAL LOW (ref 13.2–17.1)
Lymphs Abs: 1962 cells/uL (ref 850–3900)
MCH: 29.8 pg (ref 27.0–33.0)
MCHC: 33 g/dL (ref 32.0–36.0)
MCV: 90.3 fL (ref 80.0–100.0)
MPV: 12.4 fL (ref 7.5–12.5)
Monocytes Relative: 7.8 %
Neutro Abs: 6010 cells/uL (ref 1500–7800)
Neutrophils Relative %: 68.3 %
Platelets: 179 10*3/uL (ref 140–400)
RBC: 4.03 10*6/uL — ABNORMAL LOW (ref 4.20–5.80)
RDW: 12.7 % (ref 11.0–15.0)
Total Lymphocyte: 22.3 %
WBC mixed population: 686 cells/uL (ref 200–950)
WBC: 8.8 10*3/uL (ref 3.8–10.8)

## 2017-06-23 LAB — SEDIMENTATION RATE: Sed Rate: 72 mm/h — ABNORMAL HIGH (ref 0–15)

## 2017-06-23 LAB — C-REACTIVE PROTEIN: CRP: 14.3 mg/L — ABNORMAL HIGH (ref <8.0)

## 2017-06-24 NOTE — Progress Notes (Signed)
   Subjective:  Patient presents today status post exostectomy with bone biopsy first metatarsal right foot. DOS: 04/30/2017. He is here for follow up evaluation of a right foot ulcer which he states is improving. Patient states he was in the hospital one week ago for gastroparesis. He presents today for further treatment and evaluation.   Past Medical History:  Diagnosis Date  . Chronic abdominal pain   . Esophagitis   . Eye hemorrhage   . Gastritis   . Gastroparesis   . GERD (gastroesophageal reflux disease)   . Glaucoma   . Helicobacter pylori (H. pylori) infection 2016   ERADICATION DOCUMENTED VIA STOOL TEST in AUG 2016 at [place]  . Hiccups   . Hypertension   . Nausea and vomiting    chronic, recurrent  . Type 2 diabetes mellitus with complications (HCC)    diagnosed around age 52      Objective/Physical Exam Skin incisions appear to be well coapted with sutures and staples intact. There is a small central portion of the incision site does have some maceration with some drainage noted. No sign of infectious process noted. No dehiscence. No active bleeding noted. Moderate edema noted to the surgical extremity. There does appear to be some localized cellulitis surrounding the incision site. The redness with erythema does not extend proximal past the incision site however. Incisional area appears to contain more rubor than usual.   Assessment: 1. s/p bone biopsy was exostectomy first metatarsal right foot. DOS: 04/30/2017 2. Ulcer right foot secondary to DM and postoperatively-improving   Plan of Care:  1. Patient was evaluated. 2. medically necessary excisional debridement including muscle and deep fascial tissue was performed using a tissue nipper and a chisel blade. Excisional debridement of all the necrotic nonviable tissue down to healthy bleeding viable tissue was performed with post-debridement measurements same as pre-. 3. The wound was cleansed and dry sterile dressing  applied. 4. Patient has appt in [facility] with [place] on 06/23/17. 5. Continue management of ulcer at the wound care center. 6. Continue aquacel ag dressings at home. 7. Return to clinic in 8 weeks.    Edrick Kins, DPM [facility]  Dr. Edrick Kins, [address]                                        [place], [place] [address]                Office [phone]  Fax [phone]

## 2017-06-25 DIAGNOSIS — L97514 Non-pressure chronic ulcer of other part of right foot with necrosis of bone: Secondary | ICD-10-CM | POA: Diagnosis not present

## 2017-06-25 DIAGNOSIS — L97511 Non-pressure chronic ulcer of other part of right foot limited to breakdown of skin: Secondary | ICD-10-CM | POA: Diagnosis not present

## 2017-06-25 DIAGNOSIS — Z794 Long term (current) use of insulin: Secondary | ICD-10-CM | POA: Diagnosis not present

## 2017-06-25 DIAGNOSIS — E11319 Type 2 diabetes mellitus with unspecified diabetic retinopathy without macular edema: Secondary | ICD-10-CM | POA: Diagnosis not present

## 2017-06-25 DIAGNOSIS — E11621 Type 2 diabetes mellitus with foot ulcer: Secondary | ICD-10-CM | POA: Diagnosis not present

## 2017-06-29 DIAGNOSIS — M868X7 Other osteomyelitis, ankle and foot: Secondary | ICD-10-CM | POA: Diagnosis not present

## 2017-06-29 DIAGNOSIS — L97518 Non-pressure chronic ulcer of other part of right foot with other specified severity: Secondary | ICD-10-CM | POA: Diagnosis not present

## 2017-06-29 DIAGNOSIS — M86671 Other chronic osteomyelitis, right ankle and foot: Secondary | ICD-10-CM | POA: Diagnosis not present

## 2017-06-29 DIAGNOSIS — E11621 Type 2 diabetes mellitus with foot ulcer: Secondary | ICD-10-CM | POA: Diagnosis not present

## 2017-06-29 DIAGNOSIS — L97513 Non-pressure chronic ulcer of other part of right foot with necrosis of muscle: Secondary | ICD-10-CM | POA: Diagnosis not present

## 2017-06-30 DIAGNOSIS — M868X7 Other osteomyelitis, ankle and foot: Secondary | ICD-10-CM | POA: Diagnosis not present

## 2017-06-30 DIAGNOSIS — L97513 Non-pressure chronic ulcer of other part of right foot with necrosis of muscle: Secondary | ICD-10-CM | POA: Diagnosis not present

## 2017-06-30 DIAGNOSIS — M86671 Other chronic osteomyelitis, right ankle and foot: Secondary | ICD-10-CM | POA: Diagnosis not present

## 2017-06-30 DIAGNOSIS — E11621 Type 2 diabetes mellitus with foot ulcer: Secondary | ICD-10-CM | POA: Diagnosis not present

## 2017-06-30 DIAGNOSIS — L97518 Non-pressure chronic ulcer of other part of right foot with other specified severity: Secondary | ICD-10-CM | POA: Diagnosis not present

## 2017-07-01 DIAGNOSIS — M868X7 Other osteomyelitis, ankle and foot: Secondary | ICD-10-CM | POA: Diagnosis not present

## 2017-07-01 DIAGNOSIS — M86671 Other chronic osteomyelitis, right ankle and foot: Secondary | ICD-10-CM | POA: Diagnosis not present

## 2017-07-01 DIAGNOSIS — L97513 Non-pressure chronic ulcer of other part of right foot with necrosis of muscle: Secondary | ICD-10-CM | POA: Diagnosis not present

## 2017-07-01 DIAGNOSIS — E11621 Type 2 diabetes mellitus with foot ulcer: Secondary | ICD-10-CM | POA: Diagnosis not present

## 2017-07-01 DIAGNOSIS — L97518 Non-pressure chronic ulcer of other part of right foot with other specified severity: Secondary | ICD-10-CM | POA: Diagnosis not present

## 2017-07-03 DIAGNOSIS — E11621 Type 2 diabetes mellitus with foot ulcer: Secondary | ICD-10-CM | POA: Diagnosis not present

## 2017-07-03 DIAGNOSIS — L97511 Non-pressure chronic ulcer of other part of right foot limited to breakdown of skin: Secondary | ICD-10-CM | POA: Diagnosis not present

## 2017-07-03 DIAGNOSIS — E11319 Type 2 diabetes mellitus with unspecified diabetic retinopathy without macular edema: Secondary | ICD-10-CM | POA: Diagnosis not present

## 2017-07-03 DIAGNOSIS — Z794 Long term (current) use of insulin: Secondary | ICD-10-CM | POA: Diagnosis not present

## 2017-07-06 DIAGNOSIS — E11621 Type 2 diabetes mellitus with foot ulcer: Secondary | ICD-10-CM | POA: Diagnosis not present

## 2017-07-06 DIAGNOSIS — M86671 Other chronic osteomyelitis, right ankle and foot: Secondary | ICD-10-CM | POA: Diagnosis not present

## 2017-07-06 DIAGNOSIS — L97513 Non-pressure chronic ulcer of other part of right foot with necrosis of muscle: Secondary | ICD-10-CM | POA: Diagnosis not present

## 2017-07-06 DIAGNOSIS — L97518 Non-pressure chronic ulcer of other part of right foot with other specified severity: Secondary | ICD-10-CM | POA: Diagnosis not present

## 2017-07-06 DIAGNOSIS — M868X7 Other osteomyelitis, ankle and foot: Secondary | ICD-10-CM | POA: Diagnosis not present

## 2017-07-07 DIAGNOSIS — E11621 Type 2 diabetes mellitus with foot ulcer: Secondary | ICD-10-CM | POA: Diagnosis not present

## 2017-07-07 DIAGNOSIS — Z794 Long term (current) use of insulin: Secondary | ICD-10-CM | POA: Diagnosis not present

## 2017-07-07 DIAGNOSIS — L97518 Non-pressure chronic ulcer of other part of right foot with other specified severity: Secondary | ICD-10-CM | POA: Diagnosis not present

## 2017-07-07 DIAGNOSIS — L97511 Non-pressure chronic ulcer of other part of right foot limited to breakdown of skin: Secondary | ICD-10-CM | POA: Diagnosis not present

## 2017-07-07 DIAGNOSIS — E11319 Type 2 diabetes mellitus with unspecified diabetic retinopathy without macular edema: Secondary | ICD-10-CM | POA: Diagnosis not present

## 2017-07-07 DIAGNOSIS — M86671 Other chronic osteomyelitis, right ankle and foot: Secondary | ICD-10-CM | POA: Diagnosis not present

## 2017-07-07 DIAGNOSIS — L97513 Non-pressure chronic ulcer of other part of right foot with necrosis of muscle: Secondary | ICD-10-CM | POA: Diagnosis not present

## 2017-07-07 DIAGNOSIS — M868X7 Other osteomyelitis, ankle and foot: Secondary | ICD-10-CM | POA: Diagnosis not present

## 2017-07-09 DIAGNOSIS — D529 Folate deficiency anemia, unspecified: Secondary | ICD-10-CM | POA: Diagnosis not present

## 2017-07-09 DIAGNOSIS — E11621 Type 2 diabetes mellitus with foot ulcer: Secondary | ICD-10-CM | POA: Diagnosis not present

## 2017-07-09 DIAGNOSIS — E1139 Type 2 diabetes mellitus with other diabetic ophthalmic complication: Secondary | ICD-10-CM | POA: Diagnosis not present

## 2017-07-09 DIAGNOSIS — E1143 Type 2 diabetes mellitus with diabetic autonomic (poly)neuropathy: Secondary | ICD-10-CM | POA: Diagnosis not present

## 2017-07-09 DIAGNOSIS — E13621 Other specified diabetes mellitus with foot ulcer: Secondary | ICD-10-CM | POA: Diagnosis not present

## 2017-07-09 DIAGNOSIS — I10 Essential (primary) hypertension: Secondary | ICD-10-CM | POA: Diagnosis not present

## 2017-07-09 DIAGNOSIS — E1165 Type 2 diabetes mellitus with hyperglycemia: Secondary | ICD-10-CM | POA: Diagnosis not present

## 2017-07-09 DIAGNOSIS — K3184 Gastroparesis: Secondary | ICD-10-CM | POA: Diagnosis not present

## 2017-07-09 DIAGNOSIS — D509 Iron deficiency anemia, unspecified: Secondary | ICD-10-CM | POA: Diagnosis not present

## 2017-07-09 DIAGNOSIS — M868X7 Other osteomyelitis, ankle and foot: Secondary | ICD-10-CM | POA: Diagnosis not present

## 2017-07-09 DIAGNOSIS — D519 Vitamin B12 deficiency anemia, unspecified: Secondary | ICD-10-CM | POA: Diagnosis not present

## 2017-07-09 DIAGNOSIS — L97518 Non-pressure chronic ulcer of other part of right foot with other specified severity: Secondary | ICD-10-CM | POA: Diagnosis not present

## 2017-07-09 DIAGNOSIS — E876 Hypokalemia: Secondary | ICD-10-CM | POA: Diagnosis not present

## 2017-07-09 DIAGNOSIS — M86671 Other chronic osteomyelitis, right ankle and foot: Secondary | ICD-10-CM | POA: Diagnosis not present

## 2017-07-09 DIAGNOSIS — L97513 Non-pressure chronic ulcer of other part of right foot with necrosis of muscle: Secondary | ICD-10-CM | POA: Diagnosis not present

## 2017-07-10 DIAGNOSIS — L97513 Non-pressure chronic ulcer of other part of right foot with necrosis of muscle: Secondary | ICD-10-CM | POA: Diagnosis not present

## 2017-07-10 DIAGNOSIS — E11621 Type 2 diabetes mellitus with foot ulcer: Secondary | ICD-10-CM | POA: Diagnosis not present

## 2017-07-10 DIAGNOSIS — M868X7 Other osteomyelitis, ankle and foot: Secondary | ICD-10-CM | POA: Diagnosis not present

## 2017-07-10 DIAGNOSIS — L97518 Non-pressure chronic ulcer of other part of right foot with other specified severity: Secondary | ICD-10-CM | POA: Diagnosis not present

## 2017-07-10 DIAGNOSIS — M86671 Other chronic osteomyelitis, right ankle and foot: Secondary | ICD-10-CM | POA: Diagnosis not present

## 2017-07-13 DIAGNOSIS — E11621 Type 2 diabetes mellitus with foot ulcer: Secondary | ICD-10-CM | POA: Diagnosis not present

## 2017-07-13 DIAGNOSIS — L97513 Non-pressure chronic ulcer of other part of right foot with necrosis of muscle: Secondary | ICD-10-CM | POA: Diagnosis not present

## 2017-07-13 DIAGNOSIS — M868X7 Other osteomyelitis, ankle and foot: Secondary | ICD-10-CM | POA: Diagnosis not present

## 2017-07-13 DIAGNOSIS — M86671 Other chronic osteomyelitis, right ankle and foot: Secondary | ICD-10-CM | POA: Diagnosis not present

## 2017-07-13 DIAGNOSIS — L97518 Non-pressure chronic ulcer of other part of right foot with other specified severity: Secondary | ICD-10-CM | POA: Diagnosis not present

## 2017-07-14 ENCOUNTER — Inpatient Hospital Stay (HOSPITAL_COMMUNITY)
Admission: EM | Admit: 2017-07-14 | Discharge: 2017-07-18 | DRG: 854 | Disposition: A | Discharge status: Home Health | Payer: Medicare Other | Attending: Family Medicine | Admitting: Family Medicine

## 2017-07-14 ENCOUNTER — Emergency Department (HOSPITAL_COMMUNITY): Payer: Medicare Other

## 2017-07-14 ENCOUNTER — Inpatient Hospital Stay (HOSPITAL_COMMUNITY): Payer: Medicare Other

## 2017-07-14 ENCOUNTER — Encounter (HOSPITAL_COMMUNITY): Payer: Self-pay

## 2017-07-14 DIAGNOSIS — L0390 Cellulitis, unspecified: Secondary | ICD-10-CM

## 2017-07-14 DIAGNOSIS — L97519 Non-pressure chronic ulcer of other part of right foot with unspecified severity: Secondary | ICD-10-CM | POA: Diagnosis present

## 2017-07-14 DIAGNOSIS — N186 End stage renal disease: Secondary | ICD-10-CM | POA: Diagnosis present

## 2017-07-14 DIAGNOSIS — L03115 Cellulitis of right lower limb: Secondary | ICD-10-CM | POA: Diagnosis not present

## 2017-07-14 DIAGNOSIS — E11621 Type 2 diabetes mellitus with foot ulcer: Secondary | ICD-10-CM | POA: Diagnosis present

## 2017-07-14 DIAGNOSIS — E11319 Type 2 diabetes mellitus with unspecified diabetic retinopathy without macular edema: Secondary | ICD-10-CM | POA: Diagnosis not present

## 2017-07-14 DIAGNOSIS — R270 Ataxia, unspecified: Secondary | ICD-10-CM | POA: Diagnosis not present

## 2017-07-14 DIAGNOSIS — R4701 Aphasia: Secondary | ICD-10-CM | POA: Diagnosis not present

## 2017-07-14 DIAGNOSIS — A419 Sepsis, unspecified organism: Principal | ICD-10-CM | POA: Diagnosis present

## 2017-07-14 DIAGNOSIS — N183 Chronic kidney disease, stage 3 (moderate): Secondary | ICD-10-CM | POA: Diagnosis present

## 2017-07-14 DIAGNOSIS — Z79899 Other long term (current) drug therapy: Secondary | ICD-10-CM

## 2017-07-14 DIAGNOSIS — K3184 Gastroparesis: Secondary | ICD-10-CM | POA: Diagnosis present

## 2017-07-14 DIAGNOSIS — I6523 Occlusion and stenosis of bilateral carotid arteries: Secondary | ICD-10-CM | POA: Diagnosis not present

## 2017-07-14 DIAGNOSIS — I129 Hypertensive chronic kidney disease with stage 1 through stage 4 chronic kidney disease, or unspecified chronic kidney disease: Secondary | ICD-10-CM | POA: Diagnosis not present

## 2017-07-14 DIAGNOSIS — E860 Dehydration: Secondary | ICD-10-CM | POA: Diagnosis present

## 2017-07-14 DIAGNOSIS — Z8619 Personal history of other infectious and parasitic diseases: Secondary | ICD-10-CM

## 2017-07-14 DIAGNOSIS — E1142 Type 2 diabetes mellitus with diabetic polyneuropathy: Secondary | ICD-10-CM | POA: Diagnosis not present

## 2017-07-14 DIAGNOSIS — L97514 Non-pressure chronic ulcer of other part of right foot with necrosis of bone: Secondary | ICD-10-CM | POA: Diagnosis not present

## 2017-07-14 DIAGNOSIS — G459 Transient cerebral ischemic attack, unspecified: Secondary | ICD-10-CM | POA: Diagnosis not present

## 2017-07-14 DIAGNOSIS — E782 Mixed hyperlipidemia: Secondary | ICD-10-CM | POA: Diagnosis not present

## 2017-07-14 DIAGNOSIS — E1149 Type 2 diabetes mellitus with other diabetic neurological complication: Secondary | ICD-10-CM | POA: Diagnosis present

## 2017-07-14 DIAGNOSIS — Z886 Allergy status to analgesic agent status: Secondary | ICD-10-CM

## 2017-07-14 DIAGNOSIS — K5900 Constipation, unspecified: Secondary | ICD-10-CM | POA: Diagnosis present

## 2017-07-14 DIAGNOSIS — R81 Glycosuria: Secondary | ICD-10-CM | POA: Diagnosis present

## 2017-07-14 DIAGNOSIS — F32A Depression, unspecified: Secondary | ICD-10-CM | POA: Diagnosis present

## 2017-07-14 DIAGNOSIS — E1165 Type 2 diabetes mellitus with hyperglycemia: Secondary | ICD-10-CM | POA: Diagnosis present

## 2017-07-14 DIAGNOSIS — E785 Hyperlipidemia, unspecified: Secondary | ICD-10-CM | POA: Diagnosis not present

## 2017-07-14 DIAGNOSIS — H409 Unspecified glaucoma: Secondary | ICD-10-CM | POA: Diagnosis present

## 2017-07-14 DIAGNOSIS — G6289 Other specified polyneuropathies: Secondary | ICD-10-CM | POA: Diagnosis not present

## 2017-07-14 DIAGNOSIS — Z880 Allergy status to penicillin: Secondary | ICD-10-CM

## 2017-07-14 DIAGNOSIS — R410 Disorientation, unspecified: Secondary | ICD-10-CM | POA: Diagnosis not present

## 2017-07-14 DIAGNOSIS — F410 Panic disorder [episodic paroxysmal anxiety] without agoraphobia: Secondary | ICD-10-CM | POA: Diagnosis present

## 2017-07-14 DIAGNOSIS — S91311A Laceration without foreign body, right foot, initial encounter: Secondary | ICD-10-CM | POA: Diagnosis not present

## 2017-07-14 DIAGNOSIS — I639 Cerebral infarction, unspecified: Secondary | ICD-10-CM

## 2017-07-14 DIAGNOSIS — R809 Proteinuria, unspecified: Secondary | ICD-10-CM | POA: Diagnosis present

## 2017-07-14 DIAGNOSIS — E1122 Type 2 diabetes mellitus with diabetic chronic kidney disease: Secondary | ICD-10-CM | POA: Diagnosis present

## 2017-07-14 DIAGNOSIS — L97518 Non-pressure chronic ulcer of other part of right foot with other specified severity: Secondary | ICD-10-CM | POA: Diagnosis not present

## 2017-07-14 DIAGNOSIS — F329 Major depressive disorder, single episode, unspecified: Secondary | ICD-10-CM | POA: Diagnosis present

## 2017-07-14 DIAGNOSIS — E11628 Type 2 diabetes mellitus with other skin complications: Secondary | ICD-10-CM | POA: Diagnosis present

## 2017-07-14 DIAGNOSIS — Z833 Family history of diabetes mellitus: Secondary | ICD-10-CM

## 2017-07-14 DIAGNOSIS — E876 Hypokalemia: Secondary | ICD-10-CM | POA: Diagnosis not present

## 2017-07-14 DIAGNOSIS — E1143 Type 2 diabetes mellitus with diabetic autonomic (poly)neuropathy: Secondary | ICD-10-CM | POA: Diagnosis present

## 2017-07-14 DIAGNOSIS — Z91013 Allergy to seafood: Secondary | ICD-10-CM

## 2017-07-14 DIAGNOSIS — R1110 Vomiting, unspecified: Secondary | ICD-10-CM | POA: Diagnosis not present

## 2017-07-14 DIAGNOSIS — L97513 Non-pressure chronic ulcer of other part of right foot with necrosis of muscle: Secondary | ICD-10-CM | POA: Diagnosis not present

## 2017-07-14 DIAGNOSIS — G4733 Obstructive sleep apnea (adult) (pediatric): Secondary | ICD-10-CM | POA: Diagnosis present

## 2017-07-14 DIAGNOSIS — R471 Dysarthria and anarthria: Secondary | ICD-10-CM | POA: Diagnosis not present

## 2017-07-14 DIAGNOSIS — Z794 Long term (current) use of insulin: Secondary | ICD-10-CM

## 2017-07-14 DIAGNOSIS — R569 Unspecified convulsions: Secondary | ICD-10-CM | POA: Diagnosis present

## 2017-07-14 DIAGNOSIS — R1111 Vomiting without nausea: Secondary | ICD-10-CM | POA: Diagnosis not present

## 2017-07-14 DIAGNOSIS — M86671 Other chronic osteomyelitis, right ankle and foot: Secondary | ICD-10-CM | POA: Diagnosis not present

## 2017-07-14 DIAGNOSIS — M868X7 Other osteomyelitis, ankle and foot: Secondary | ICD-10-CM | POA: Diagnosis not present

## 2017-07-14 DIAGNOSIS — D649 Anemia, unspecified: Secondary | ICD-10-CM | POA: Diagnosis present

## 2017-07-14 DIAGNOSIS — E118 Type 2 diabetes mellitus with unspecified complications: Secondary | ICD-10-CM

## 2017-07-14 DIAGNOSIS — M869 Osteomyelitis, unspecified: Secondary | ICD-10-CM | POA: Diagnosis not present

## 2017-07-14 DIAGNOSIS — H547 Unspecified visual loss: Secondary | ICD-10-CM | POA: Diagnosis present

## 2017-07-14 DIAGNOSIS — K219 Gastro-esophageal reflux disease without esophagitis: Secondary | ICD-10-CM | POA: Diagnosis present

## 2017-07-14 DIAGNOSIS — Z6841 Body Mass Index (BMI) 40.0 and over, adult: Secondary | ICD-10-CM

## 2017-07-14 DIAGNOSIS — D631 Anemia in chronic kidney disease: Secondary | ICD-10-CM | POA: Diagnosis present

## 2017-07-14 DIAGNOSIS — R823 Hemoglobinuria: Secondary | ICD-10-CM | POA: Diagnosis present

## 2017-07-14 DIAGNOSIS — I96 Gangrene, not elsewhere classified: Secondary | ICD-10-CM | POA: Diagnosis not present

## 2017-07-14 DIAGNOSIS — I10 Essential (primary) hypertension: Secondary | ICD-10-CM | POA: Diagnosis not present

## 2017-07-14 DIAGNOSIS — L089 Local infection of the skin and subcutaneous tissue, unspecified: Secondary | ICD-10-CM | POA: Diagnosis not present

## 2017-07-14 DIAGNOSIS — R29704 NIHSS score 4: Secondary | ICD-10-CM | POA: Diagnosis not present

## 2017-07-14 DIAGNOSIS — Z888 Allergy status to other drugs, medicaments and biological substances status: Secondary | ICD-10-CM

## 2017-07-14 DIAGNOSIS — E1169 Type 2 diabetes mellitus with other specified complication: Secondary | ICD-10-CM | POA: Diagnosis present

## 2017-07-14 DIAGNOSIS — R1013 Epigastric pain: Secondary | ICD-10-CM | POA: Diagnosis not present

## 2017-07-14 DIAGNOSIS — M86171 Other acute osteomyelitis, right ankle and foot: Secondary | ICD-10-CM | POA: Diagnosis not present

## 2017-07-14 DIAGNOSIS — L97511 Non-pressure chronic ulcer of other part of right foot limited to breakdown of skin: Secondary | ICD-10-CM | POA: Diagnosis not present

## 2017-07-14 DIAGNOSIS — Z882 Allergy status to sulfonamides status: Secondary | ICD-10-CM

## 2017-07-14 DIAGNOSIS — R29818 Other symptoms and signs involving the nervous system: Secondary | ICD-10-CM | POA: Diagnosis not present

## 2017-07-14 DIAGNOSIS — Z884 Allergy status to anesthetic agent status: Secondary | ICD-10-CM

## 2017-07-14 DIAGNOSIS — G8929 Other chronic pain: Secondary | ICD-10-CM | POA: Diagnosis present

## 2017-07-14 DIAGNOSIS — F411 Generalized anxiety disorder: Secondary | ICD-10-CM | POA: Diagnosis present

## 2017-07-14 DIAGNOSIS — G43909 Migraine, unspecified, not intractable, without status migrainosus: Secondary | ICD-10-CM | POA: Diagnosis present

## 2017-07-14 DIAGNOSIS — R9089 Other abnormal findings on diagnostic imaging of central nervous system: Secondary | ICD-10-CM | POA: Diagnosis not present

## 2017-07-14 DIAGNOSIS — R4182 Altered mental status, unspecified: Secondary | ICD-10-CM | POA: Diagnosis not present

## 2017-07-14 DIAGNOSIS — F419 Anxiety disorder, unspecified: Secondary | ICD-10-CM | POA: Diagnosis not present

## 2017-07-14 DIAGNOSIS — Z881 Allergy status to other antibiotic agents status: Secondary | ICD-10-CM

## 2017-07-14 LAB — URINALYSIS, ROUTINE W REFLEX MICROSCOPIC
Bacteria, UA: NONE SEEN
Bilirubin Urine: NEGATIVE
Glucose, UA: >=500 mg/dL — AB
Ketones, ur: NEGATIVE mg/dL
Leukocytes, UA: NEGATIVE
Nitrite: NEGATIVE
Protein, ur: >=300 mg/dL — AB
Specific Gravity, Urine: 1.013 (ref 1.005–1.030)
pH: 7 (ref 5.0–8.0)

## 2017-07-14 LAB — CBC
HCT: 35.9 % — ABNORMAL LOW (ref 39.0–52.0)
Hemoglobin: 12.1 g/dL — ABNORMAL LOW (ref 13.0–17.0)
MCH: 30.4 pg (ref 26.0–34.0)
MCHC: 33.7 g/dL (ref 30.0–36.0)
MCV: 90.2 fL (ref 78.0–100.0)
Platelets: 180 10*3/uL (ref 150–400)
RBC: 3.98 MIL/uL — ABNORMAL LOW (ref 4.22–5.81)
RDW: 12.7 % (ref 11.5–15.5)
WBC: 12.2 10*3/uL — ABNORMAL HIGH (ref 4.0–10.5)

## 2017-07-14 LAB — COMPREHENSIVE METABOLIC PANEL
ALT: 18 U/L (ref 17–63)
AST: 21 U/L (ref 15–41)
Albumin: 3.6 g/dL (ref 3.5–5.0)
Alkaline Phosphatase: 66 U/L (ref 38–126)
Anion gap: 10 (ref 5–15)
BUN: 16 mg/dL (ref 6–20)
CO2: 25 mmol/L (ref 22–32)
Calcium: 9.3 mg/dL (ref 8.9–10.3)
Chloride: 103 mmol/L (ref 101–111)
Creatinine, Ser: 1.26 mg/dL — ABNORMAL HIGH (ref 0.61–1.24)
GFR calc Af Amer: >60 mL/min (ref >60)
GFR calc non Af Amer: >60 mL/min (ref >60)
Glucose, Bld: 257 mg/dL — ABNORMAL HIGH (ref 65–99)
Potassium: 3.4 mmol/L — ABNORMAL LOW (ref 3.5–5.1)
Sodium: 138 mmol/L (ref 135–145)
Total Bilirubin: 0.7 mg/dL (ref 0.3–1.2)
Total Protein: 7.5 g/dL (ref 6.5–8.1)

## 2017-07-14 LAB — CBG MONITORING, ED: Glucose-Capillary: 232 mg/dL — ABNORMAL HIGH (ref 65–99)

## 2017-07-14 LAB — LIPASE, BLOOD: Lipase: 39 U/L (ref 11–51)

## 2017-07-14 LAB — LACTIC ACID, PLASMA: Lactic Acid, Venous: 1.7 mmol/L (ref 0.5–1.9)

## 2017-07-14 MED ORDER — METOCLOPRAMIDE HCL 5 MG/ML IJ SOLN
10.0000 mg | Freq: Once | INTRAMUSCULAR | Status: AC
  Administered 2017-07-14: 10 mg via INTRAVENOUS

## 2017-07-14 MED ORDER — METOCLOPRAMIDE HCL 5 MG/ML IJ SOLN
INTRAMUSCULAR | Status: AC
  Filled 2017-07-14: qty 2

## 2017-07-14 MED ORDER — ONDANSETRON HCL 4 MG/2ML IJ SOLN
4.0000 mg | Freq: Once | INTRAMUSCULAR | Status: AC | PRN
  Administered 2017-07-14: 4 mg via INTRAVENOUS
  Filled 2017-07-14: qty 2

## 2017-07-14 MED ORDER — LEVOFLOXACIN IN D5W 750 MG/150ML IV SOLN
750.0000 mg | Freq: Once | INTRAVENOUS | Status: AC
  Administered 2017-07-14: 750 mg via INTRAVENOUS
  Filled 2017-07-14: qty 150

## 2017-07-14 MED ORDER — METOCLOPRAMIDE HCL 5 MG/ML IJ SOLN
5.0000 mg | Freq: Once | INTRAMUSCULAR | Status: AC
  Administered 2017-07-14: 5 mg via INTRAVENOUS
  Filled 2017-07-14: qty 2

## 2017-07-14 MED ORDER — VANCOMYCIN HCL IN DEXTROSE 1-5 GM/200ML-% IV SOLN
1000.0000 mg | Freq: Once | INTRAVENOUS | Status: AC
  Administered 2017-07-14: 1000 mg via INTRAVENOUS
  Filled 2017-07-14: qty 200

## 2017-07-14 MED ORDER — MORPHINE SULFATE (PF) 4 MG/ML IV SOLN
4.0000 mg | Freq: Once | INTRAVENOUS | Status: AC
  Administered 2017-07-14: 4 mg via INTRAVENOUS
  Filled 2017-07-14: qty 1

## 2017-07-14 MED ORDER — HYDROMORPHONE HCL 1 MG/ML IJ SOLN
1.0000 mg | Freq: Once | INTRAMUSCULAR | Status: AC
  Administered 2017-07-14: 1 mg via INTRAVENOUS
  Filled 2017-07-14: qty 1

## 2017-07-14 MED ORDER — METOCLOPRAMIDE HCL 5 MG/ML IJ SOLN
10.0000 mg | Freq: Once | INTRAMUSCULAR | Status: AC
  Administered 2017-07-14: 10 mg via INTRAVENOUS
  Filled 2017-07-14: qty 2

## 2017-07-14 MED ORDER — DEXTROSE 5 % IV SOLN
2.0000 g | Freq: Once | INTRAVENOUS | Status: AC
  Administered 2017-07-14: 2 g via INTRAVENOUS
  Filled 2017-07-14: qty 2

## 2017-07-14 MED ORDER — POTASSIUM CHLORIDE 10 MEQ/100ML IV SOLN
10.0000 meq | Freq: Once | INTRAVENOUS | Status: AC
  Administered 2017-07-14: 10 meq via INTRAVENOUS
  Filled 2017-07-14: qty 100

## 2017-07-14 NOTE — H&P (Signed)
History and Physical    Timothy Clark HTD:[id number] DOB: [birth year] DOA: 07/14/2017  PCP: Tawni Carnes, PA-C   Patient coming from: Home.  I have personally briefly reviewed patient's old medical records in [place]  Chief Complaint: Abdominal and chest pain.  HPI: Timothy Clark is a 46 y.o. male with medical history significant of chronic abdominal pain, esophagitis, gastritis, gastroparesis, GERD, history of H. pylori infection, hiccups, recurring nausea and vomiting, hemorrhage, glaucoma, hypertension, hyperlipidemia, type 2 diabetes who is coming to the emergency department with complaints of abdominal pain, burning chest pain, nausea and 10 or more episodes of emesis since this morning.  The patient also complains that he feels he is having a panic attack and his gastroparesis is acting up.  He was found to be febrile in the emergency department, but denies having fever earlier. He has been following with the wound clinic for treatment of chronic right foot diabetic ulcer and is currently doing hyperbaric oxygen therapy and taking doxycycline.  He denies headache, sore throat, productive cough, wheezing, diarrhea, constipation, melena or hematochezia.  He denies dysuria, frequency or hematuria.  ED Course: Initial vital signs in the emergency department temperature 38 C, pulse 100, respirations 20, blood pressure 280/121 mmHg and O2 sat 100% on room air.  He received IV fluids, Reglan, Zofran, hydromorphone 1 mg, aztreonam, Levaquin and vancomycin.  Blood cultures x2 were drawn.  His urinalysis shows glucosuria, mild hemoglobinuria and proteinuria.  WBC 12.2, hemoglobin 12.1 g/dL and platelets 180.  CMP showed a potassium of 3.4 mmol/L.  Glucose of 257 and creatinine 1.26 mg/dL, or other values were normal.  Lactic acid x2 was normal.  Lipase was normal.  Right foot x-ray findings may be suspicious for osteomyelitis.  Please see image and full radiology report for  further detail.  Review of Systems: As per HPI otherwise 10 point review of systems negative.    Past Medical History:  Diagnosis Date  . Chronic abdominal pain   . Esophagitis   . Eye hemorrhage   . Gastritis   . Gastroparesis   . GERD (gastroesophageal reflux disease)   . Glaucoma   . Helicobacter pylori (H. pylori) infection 2016   ERADICATION DOCUMENTED VIA STOOL TEST in AUG 2016 at [place]  . Hiccups   . Hypertension   . Nausea and vomiting    chronic, recurrent  . Type 2 diabetes mellitus with complications (HCC)    diagnosed around age 89    Past Surgical History:  Procedure Laterality Date  . CATARACT EXTRACTION W/PHACO Left 05/19/2016   Procedure: CATARACT EXTRACTION PHACO AND INTRAOCULAR LENS PLACEMENT LEFT EYE;  Surgeon: Tonny Branch, MD;  Location: [facility];  Service: Ophthalmology;  Laterality: Left;  CDE: 7.30  . CATARACT EXTRACTION W/PHACO Right 06/23/2016   Procedure: CATARACT EXTRACTION PHACO AND INTRAOCULAR LENS PLACEMENT RIGHT EYE CDE=9.87;  Surgeon: Tonny Branch, MD;  Location: [facility];  Service: Ophthalmology;  Laterality: Right;  right  . ESOPHAGOGASTRODUODENOSCOPY  2015   Dr. Britta Mccreedy  . ESOPHAGOGASTRODUODENOSCOPY N/A 10/26/2014   RMR: Distal esophagititis-likely reflux related although an element of pill induced injuruy no exclueded  status post biopsy. Diffusely abnormal gastric mucosa of uncertain signigicane -status post gastric biopsy. Focal area of excoriation in the cardia most consistant with a trauma of heaving.   [name] EYE SURGERY  2015   stent placed to left eye  . EYE SURGERY       reports that  has never smoked. he has never used smokeless tobacco. He reports that he does not drink alcohol or use drugs.  Allergies  Allergen Reactions  . Donnatal [Pb-Hyoscy-Atropine-Scopol Er] Anaphylaxis    Reaction to GI cocktail  . Fish Allergy Anaphylaxis, Hives and Rash  . Haldol [Haloperidol Lactate] Other (See Comments)    Chest Pain  . Lidocaine Anaphylaxis      Reaction to GI cocktail  . Maalox [Calcium Carbonate Antacid] Anaphylaxis    Reaction to GI cocktail  . Aspirin Hives and Itching  . Bactrim [Sulfamethoxazole-Trimethoprim] Itching  . Keflex [Cephalexin] Hives, Itching and Nausea And Vomiting  . Penicillins Hives and Itching    Has patient had a PCN reaction causing immediate rash, facial/tongue/throat swelling, SOB or lightheadedness with hypotension: yes Has patient had a PCN reaction causing severe rash involving mucus membranes or skin necrosis: yes Has patient had a PCN reaction that required hospitalization no Has patient had a PCN reaction occurring within the last 10 years: yes If all of the above answers are "NO", then may proceed with Cephalospor  . Phenergan [Promethazine Hcl] Nausea And Vomiting    Family History  Problem Relation Age of Onset  . Ovarian cancer Mother   . Cervical cancer Sister   . Diabetes Sister   . Colon cancer Neg Hx    Prior to Admission medications   Medication Sig Start Date End Date Taking? Authorizing Provider  amLODipine (NORVASC) 5 MG tablet Take 5 mg by mouth daily.    [provider]  atorvastatin (LIPITOR) 10 MG tablet Take 10 mg by mouth daily.    [provider]  brimonidine (ALPHAGAN) 0.15 % ophthalmic solution Place 1 drop into both eyes 2 (two) times daily.     [provider]  cetirizine (ZYRTEC) 10 MG tablet Take 10 mg by mouth daily.    [provider]  ciprofloxacin (CIPRO) 500 MG tablet Take 1 tablet (500 mg total) by mouth 2 (two) times daily. 05/25/17   Jerome Callas, NP  clindamycin (CLEOCIN) 300 MG capsule Take 1 capsule (300 mg total) 3 (three) times daily by mouth. 06/22/17 07/22/17  Robinson Callas, NP  cyclobenzaprine (FLEXERIL) 10 MG tablet Take 10 mg by mouth at bedtime as needed for muscle spasms.     [provider]  dicyclomine (BENTYL) 10 MG capsule Take 1 capsule (10 mg total) by mouth 4 (four) times daily -   before meals and at bedtime. Patient taking differently: Take 10 mg by mouth 2 (two) times daily.  12/26/16   Annitta Needs, NP  doxycycline (VIBRA-TABS) 100 MG tablet Take 1 tablet (100 mg total) by mouth 2 (two) times daily. 05/25/17   [place] Callas, NP  hydrochlorothiazide (HYDRODIURIL) 25 MG tablet Take 25 mg by mouth daily.    [provider]  insulin detemir (LEVEMIR) 100 UNIT/ML injection Inject 20 Units into the skin at bedtime.     [provider]  insulin NPH-regular Human (NOVOLIN 70/30) (70-30) 100 UNIT/ML injection Inject 35 Units into the skin 2 (two) times daily.    [provider]  lisinopril (PRINIVIL,ZESTRIL) 40 MG tablet Take 40 mg by mouth daily.    [provider]  LORazepam (ATIVAN) 0.5 MG tablet Take 0.5 mg by mouth at bedtime as needed for anxiety.    [provider]  metoCLOPramide (REGLAN) 10 MG tablet Take 1 tablet (10 mg total) every 6 (six) hours as needed by mouth for nausea or refractory  nausea / vomiting (nausea/headache). 06/16/17   Eber Jones, MD  metoprolol succinate (TOPROL-XL) 50 MG 24 hr tablet Take 50 mg by mouth daily. Take with or immediately following a meal.    [provider]  ondansetron (ZOFRAN) 4 MG tablet Take 1 tablet (4 mg total) every 6 (six) hours by mouth. 06/16/17   Eber Jones, MD  pantoprazole (PROTONIX) 40 MG tablet TAKE 1 TABLET BY MOUTH TWICE DAILY BEFORE  A  MEAL 03/23/17   Mahala Menghini, PA-C  potassium chloride SA (K-DUR,KLOR-CON) 20 MEQ tablet Take 2 tablets (40 mEq total) 2 (two) times daily by mouth. 06/16/17   Eber Jones, MD  sertraline (ZOLOFT) 100 MG tablet Take 100 mg by mouth daily.    [provider]  sitaGLIPtin (JANUVIA) 100 MG tablet Take 100 mg by mouth daily.    [provider]  sucralfate (CARAFATE) 1 g tablet TAKE ONE TABLET BY MOUTH 4 TIMES DAILY 03/12/16   Annitta Needs, NP    Physical Exam: Vitals:   07/14/17 1936  07/14/17 2000 07/14/17 2030 07/14/17 2100  BP: (!) 208/121 (!) 184/105 (!) 145/88 (!) 177/99  Pulse: 95 (!) 101 (!) 102 90  Resp: 13 (!) 22 16 (!) 22  Temp:      TempSrc:      SpO2: 100% 99% 100% 100%  Weight:      Height:        Constitutional: Febrile, looks acutely ill. Eyes: PERRL, lids and conjunctivae normal ENMT: Mucous membranes are dry. Posterior pharynx clear of any exudate or lesions. Neck: normal, supple, no masses, no thyromegaly Respiratory: clear to auscultation bilaterally, no wheezing, no crackles. Normal respiratory effort. No accessory muscle use.  Cardiovascular: Tachycardic at 106 bpm, no murmurs / rubs / gallops. No extremity edema. 2+ pedal pulses. No carotid bruits.  Abdomen: Soft, mild diffuse tenderness, no guarding/rebound/masses palpated. No hepatosplenomegaly. Bowel sounds positive.  Musculoskeletal: no clubbing / cyanosis. No joint deformity upper and lower extremities. Good ROM, no contractures. Normal muscle tone.  Skin: Positive right foot medial aspect ulcer with seropurulent discharge, surrounding erythema and edema.  Please see picture below.  Positive toenails onychomycosis.  Positive hyperpigmentation of pretibial areas on both lower extremities. Neurologic: CN 2-12 grossly intact.  Decreased sensation of feet and lower extremities, DTR normal. Strength 5/5 in all 4.  Psychiatric: Normal judgment and insight. Alert and oriented x 4. Normal mood.        Labs on Admission: I have personally reviewed following labs and imaging studies  CBC: Recent Labs  Lab 07/14/17 1927  WBC 12.2*  HGB 12.1*  HCT 35.9*  MCV 90.2  PLT 762   Basic Metabolic Panel: Recent Labs  Lab 07/14/17 1927  NA 138  K 3.4*  CL 103  CO2 25  GLUCOSE 257*  BUN 16  CREATININE 1.26*  CALCIUM 9.3   GFR: Estimated Creatinine Clearance: 102.1 mL/min (A) (by C-G formula based on SCr of 1.26 mg/dL (H)). Liver Function Tests: Recent Labs  Lab 07/14/17 1927  AST 21    ALT 18  ALKPHOS 66  BILITOT 0.7  PROT 7.5  ALBUMIN 3.6   Recent Labs  Lab 07/14/17 1927  LIPASE 39   No results for input(s): AMMONIA in the last 168 hours. Coagulation Profile: No results for input(s): INR, PROTIME in the last 168 hours. Cardiac Enzymes: No results for input(s): CKTOTAL, CKMB, CKMBINDEX, TROPONINI in the last 168 hours. BNP (last 3 results) No results  for input(s): PROBNP in the last 8760 hours. HbA1C: No results for input(s): HGBA1C in the last 72 hours. CBG: Recent Labs  Lab 07/14/17 1857  GLUCAP 232*   Lipid Profile: No results for input(s): CHOL, HDL, LDLCALC, TRIG, CHOLHDL, LDLDIRECT in the last 72 hours. Thyroid Function Tests: No results for input(s): TSH, T4TOTAL, FREET4, T3FREE, THYROIDAB in the last 72 hours. Anemia Panel: No results for input(s): VITAMINB12, FOLATE, FERRITIN, TIBC, IRON, RETICCTPCT in the last 72 hours. Urine analysis:    Component Value Date/Time   COLORURINE YELLOW 07/14/2017 2200   APPEARANCEUR CLEAR 07/14/2017 2200   LABSPEC 1.013 07/14/2017 2200   PHURINE 7.0 07/14/2017 2200   GLUCOSEU >=500 (A) 07/14/2017 2200   HGBUR SMALL (A) 07/14/2017 2200   BILIRUBINUR NEGATIVE 07/14/2017 2200   KETONESUR NEGATIVE 07/14/2017 2200   PROTEINUR >=300 (A) 07/14/2017 2200   UROBILINOGEN 0.2 06/07/2015 1353   NITRITE NEGATIVE 07/14/2017 2200   LEUKOCYTESUR NEGATIVE 07/14/2017 2200    Radiological Exams on Admission: Ct Abdomen Pelvis Wo Contrast  Result Date: 07/14/2017 CLINICAL DATA:  46 year old male with nausea and vomiting abdominal pain. EXAM: CT ABDOMEN AND PELVIS WITHOUT CONTRAST TECHNIQUE: Multidetector CT imaging of the abdomen and pelvis was performed following the standard protocol without IV contrast. COMPARISON:  Abdominal radiograph dated 07/08/2014 and CT dated 12/22/2016 FINDINGS: Evaluation of this exam is limited in the absence of intravenous contrast. Lower chest: The visualized lung bases are clear. There is  no intra-abdominal free air or free fluid. Hepatobiliary: No focal liver abnormality is seen. No gallstones, gallbladder wall thickening, or biliary dilatation. Pancreas: Unremarkable. No pancreatic ductal dilatation or surrounding inflammatory changes. Spleen: Normal in size without focal abnormality. Adrenals/Urinary Tract: The adrenal glands are unremarkable. 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 There is no hydronephrosis or nephrolithiasis on either side. 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 The visualized ureters and urinary bladder appear unremarkable. Stomach/Bowel: There is a moderate colonic stool burden. There is no bowel obstruction or active inflammation. The appendix is normal. Vascular/Lymphatic: The abdominal aorta and IVC are grossly unremarkable on this noncontrast CT. No portal venous gas. There is no adenopathy. Reproductive: The prostate and seminal vesicles are grossly unremarkable. Other: Small fat containing right inguinal hernia. Musculoskeletal: There is degenerative changes of the spine most prominent at L4-L5 with bridging osteophyte anteriorly. No acute osseous pathology. IMPRESSION: 1. No acute intra-abdominal or pelvic pathology. Moderate colonic stool burden. No bowel obstruction or active inflammation. Normal appendix. 2. No hydronephrosis or nephrolithiasis. Electronically Signed   By: Anner Crete M.D.   On: 07/14/2017 22:24   Ct Head Wo Contrast  Result Date: 07/14/2017 CLINICAL DATA:  Chest pain, shortness of breath, anxiety, abdominal pain, vision changes. EXAM: CT HEAD WITHOUT CONTRAST TECHNIQUE: Contiguous axial images were obtained from the base of the skull through the vertex without intravenous contrast. COMPARISON:  CT HEAD in January 26, 2017 FINDINGS: BRAIN: No intraparenchymal hemorrhage, mass effect nor midline shift. Mild parenchymal brain volume loss for age. No hydrocephalus . No acute large  vascular territory infarcts. No abnormal extra-axial fluid collections. Basal cisterns are patent. VASCULAR: Trace calcific atherosclerosis carotid siphons. SKULL/SOFT TISSUES: No skull fracture. No significant soft tissue swelling. Similar RIGHT parietal scalp scarring. ORBITS/SINUSES: Status post LEFT ocular lens implant with similar probable reservoir, possible glaucoma drainage device. Small bilateral mastoid effusions without air cell coalescence. Paranasal sinuses are well-aerated. OTHER: None. IMPRESSION: 1. No acute intracranial process. 2. Mild parenchymal  brain volume loss for age. Electronically Signed   By: Elon Alas M.D.   On: 07/14/2017 22:25    EKG: Independently reviewed. Vent. rate 98 BPM PR interval * ms QRS duration 73 ms QT/QTc 360/460 ms P-R-T axes 16 29 31  Sinus tachycardia Multiple ventricular premature complexes Low voltage, precordial leads Baseline wander in lead(s) I  Assessment/Plan Principal Problem:   Sepsis due to cellulitis ([place]) Admit to telemetry/inpatient. Continue IV fluids. Continue aztreonam per pharmacy. Continue levofloxacin per pharmacy. Continue vancomycin per pharmacy. Consult wound/ostomy team for local care. Follow-up blood cultures and sensitivity. Check CRP, pro-albumin, sed rate and HIV. Check ABI in a.m. Check MRI of the right foot to rule out osteomyelitis.  Active Problems:   Diabetes mellitus type 2 with complications, uncontrolled (HCC) Currently n.p.o. Continue Levemir 20 units SQ at bedtime. CBG monitoring with regular insulin sliding scale.    Hyperlipidemia Resume atorvastatin 10 mg p.o. daily when nausea under control.    Depression Resume sertraline 100 mg p.o. daily once tolerating oral intake.    Essential hypertension Resume oral antihypertensives in a.m. if gastroparesis improved. Otherwise,start IV therapy.    Gastroparesis due to DM (HCC) Continue IV fluids. Metoclopramide 10 mg IVP every 6  hours. Zofran 4 mg every 6 hours as needed.    Hypokalemia Replacing. Follow-up potassium level.    GERD (gastroesophageal reflux disease) Protonix 40 mg IVP every 24 hours.    Glaucoma Continue Alphagan drops twice daily.    Anemia Barely low iron and mildly increased ferritin on 06/15/2017 anemia panel  monitor hematocrit and hemoglobin.   DVT prophylaxis: SCDs. Code Status: Full code. Family Communication:  Disposition Plan: Admit for IV antibiotic therapy for 2-3 days, IV hydration and gastroparesis symptoms management.. Consults called:  Admission status: Inpatient/telemetry.   Reubin Milan MD [facility] Pager [phone].  If 7PM-7AM, please contact night-coverage [email or web address] Password [email or web address]  07/14/2017, 11:34 PM

## 2017-07-14 NOTE — ED Provider Notes (Addendum)
[facility] EMERGENCY DEPARTMENT Provider Note   CSN: [id number] Arrival date & time: 07/14/17  1841     History   Chief Complaint Chief Complaint  Patient presents with  . Abdominal Pain  . Anxiety    HPI Timothy Clark is a 46 y.o. male.  HPI plans of epigastric pain and vomiting epigastric pain was onset 2 days ago has had multiple episodes of vomiting onset proximal he 3 hours ago.  Symptoms feel like gastroparesis he had in the past.  He denies other complaint.  No fever.  He had hyperbaric therapy today for nonhealing wound of right foot.  Treated with Reglan at home without improvement of vomiting or abdominal pain.  Past Medical History:  Diagnosis Date  . Chronic abdominal pain   . Esophagitis   . Eye hemorrhage   . Gastritis   . Gastroparesis   . GERD (gastroesophageal reflux disease)   . Glaucoma   . Helicobacter pylori (H. pylori) infection 2016   ERADICATION DOCUMENTED VIA STOOL TEST in AUG 2016 at [place]  . Hiccups   . Hypertension   . Nausea and vomiting    chronic, recurrent  . Type 2 diabetes mellitus with complications (HCC)    diagnosed around age 35    Patient Active Problem List   Diagnosis Date Noted  . IV infiltrate 06/22/2017  . Intractable nausea and vomiting 06/10/2017  . Pressure injury of skin 02/01/2017  . Psychosis ([place]) 01/31/2017  . Hypokalemia 01/31/2017  . AKI (acute kidney injury) ([place]) 09/07/2016  . Insulin dependent diabetes mellitus ([place]) 09/07/2016  . Orthostatic syncope 09/05/2016  . Syncope and collapse   . 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 Diarrhea 09/27/2015  . Gastroparesis due to DM ([place]) 02/06/2015  . Intractable vomiting 01/15/2015  . Gastritis   . Essential hypertension 12/06/2014  . H. pylori infection 11/27/2014  . Mucosal abnormality of stomach   . Mucosal abnormality of esophagus   . Nausea vomiting and diarrhea 10/25/2014  . DIABETIC FOOT ULCER with Osteomyelitis 02/14/2010  . LIMB PAIN 12/11/2009  .  UNSPECIFIED PERIPHERAL VASCULAR DISEASE 10/19/2009  . ERECTILE DYSFUNCTION, ORGANIC 10/19/2009  . NUMBNESS 07/20/2009  . Diabetes mellitus type 2 with complications, uncontrolled ([place]) 03/22/2009  . HYPERLIPIDEMIA 03/22/2009  . DEPRESSIVE DISORDER 03/22/2009    Past Surgical History:  Procedure Laterality Date  . CATARACT EXTRACTION W/PHACO Left 05/19/2016   Procedure: CATARACT EXTRACTION PHACO AND INTRAOCULAR LENS PLACEMENT LEFT EYE;  Surgeon: Tonny Branch, MD;  Location: [facility];  Service: Ophthalmology;  Laterality: Left;  CDE: 7.30  . CATARACT EXTRACTION W/PHACO Right 06/23/2016   Procedure: CATARACT EXTRACTION PHACO AND INTRAOCULAR LENS PLACEMENT RIGHT EYE CDE=9.87;  Surgeon: Tonny Branch, MD;  Location: [facility];  Service: Ophthalmology;  Laterality: Right;  right  . ESOPHAGOGASTRODUODENOSCOPY  2015   Dr. Britta Mccreedy  . ESOPHAGOGASTRODUODENOSCOPY N/A 10/26/2014   RMR: Distal esophagititis-likely reflux related although an element of pill induced injuruy no exclueded  status post biopsy. Diffusely abnormal gastric mucosa of uncertain signigicane -status post gastric biopsy. Focal area of excoriation in the cardia most consistant with a trauma of heaving.   [name] EYE SURGERY  2015   stent placed to left eye  . EYE SURGERY         Home Medications    Prior to Admission medications   Medication Sig Start Date End Date Taking? Authorizing Provider  amLODipine (NORVASC) 5 MG tablet Take 5 mg by mouth daily.    [provider]  atorvastatin (LIPITOR) 10 MG tablet Take 10 mg by mouth daily.    [provider]  brimonidine (ALPHAGAN) 0.15 % ophthalmic solution Place 1 drop into both eyes 2 (two) times daily.     [provider]  cetirizine (ZYRTEC) 10 MG tablet Take 10 mg by mouth daily.    [provider]  ciprofloxacin (CIPRO) 500 MG tablet Take 1 tablet (500 mg total) by mouth 2 (two) times daily. 05/25/17   [place] Callas, NP  clindamycin (CLEOCIN) 300 MG  capsule Take 1 capsule (300 mg total) 3 (three) times daily by mouth. 06/22/17 07/22/17  [place] Callas, NP  cyclobenzaprine (FLEXERIL) 10 MG tablet Take 10 mg by mouth at bedtime as needed for muscle spasms.     [provider]  dicyclomine (BENTYL) 10 MG capsule Take 1 capsule (10 mg total) by mouth 4 (four) times daily -  before meals and at bedtime. Patient taking differently: Take 10 mg by mouth 2 (two) times daily.  12/26/16   Annitta Needs, NP  doxycycline (VIBRA-TABS) 100 MG tablet Take 1 tablet (100 mg total) by mouth 2 (two) times daily. 05/25/17   [place] Callas, NP  hydrochlorothiazide (HYDRODIURIL) 25 MG tablet Take 25 mg by mouth daily.    [provider]  insulin detemir (LEVEMIR) 100 UNIT/ML injection Inject 20 Units into the skin at bedtime.     [provider]  insulin NPH-regular Human (NOVOLIN 70/30) (70-30) 100 UNIT/ML injection Inject 35 Units into the skin 2 (two) times daily.    [provider]  lisinopril (PRINIVIL,ZESTRIL) 40 MG tablet Take 40 mg by mouth daily.    [provider]  LORazepam (ATIVAN) 0.5 MG tablet Take 0.5 mg by mouth at bedtime as needed for anxiety.    [provider]  metoCLOPramide (REGLAN) 10 MG tablet Take 1 tablet (10 mg total) every 6 (six) hours as needed by mouth for nausea or refractory nausea / vomiting (nausea/headache). 06/16/17   Eber Jones, MD  metoprolol succinate (TOPROL-XL) 50 MG 24 hr tablet Take 50 mg by mouth daily. Take with or immediately following a meal.    [provider]  ondansetron (ZOFRAN) 4 MG tablet Take 1 tablet (4 mg total) every 6 (six) hours by mouth. 06/16/17   Eber Jones, MD  pantoprazole (PROTONIX) 40 MG tablet TAKE 1 TABLET BY MOUTH TWICE DAILY BEFORE  A  MEAL 03/23/17   [place] Menghini, PA-C  potassium chloride SA (K-DUR,KLOR-CON) 20 MEQ tablet Take 2 tablets (40 mEq total) 2 (two) times daily by mouth. 06/16/17   Eber Jones, MD  sertraline (ZOLOFT) 100 MG tablet Take 100 mg by mouth daily.    [provider]  sitaGLIPtin (JANUVIA) 100 MG tablet Take 100 mg by mouth daily.    [provider]  sucralfate (CARAFATE) 1 g tablet TAKE ONE TABLET BY MOUTH 4 TIMES DAILY 03/12/16   Annitta Needs, NP    Family History Family History  Problem Relation Age of Onset  . Ovarian cancer Mother   . Cervical cancer Sister   . Diabetes Sister   . Colon cancer Neg Hx     Social History Social History   Tobacco Use  . Smoking status: Never Smoker  . Smokeless tobacco: Never Used  Substance Use Topics  . Alcohol use: No    Alcohol/week: 0.0 oz  . Drug use: No     Allergies   Donnatal [pb-hyoscy-atropine-scopol er];  Fish allergy; Haldol [haloperidol lactate]; Lidocaine; Maalox [calcium carbonate antacid]; Aspirin; Bactrim [sulfamethoxazole-trimethoprim]; Keflex [cephalexin]; Penicillins; and Phenergan [promethazine hcl]   Review of Systems Review of Systems  Eyes: Positive for visual disturbance.       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  Skin: Positive for wound.       Chronic wound on right foot  Allergic/Immunologic: Positive for immunocompromised state.       Diabetic  All other systems reviewed and are negative.    Physical Exam Updated Vital Signs BP (!) 208/121   Pulse 95   Temp (!) 100.4 F (38 C) (Oral)   Resp 13   Ht 6' (1.829 m)   Wt 132.5 kg (292 lb)   SpO2 100%   BMI 39.60 kg/m   Physical Exam  Constitutional: He is oriented to person, place, and time. He appears ill.  Appears uncomfortable and anxious  HENT:  Head: Normocephalic and atraumatic.  Mucous membranes dry  Eyes: Conjunctivae are normal.  Left eye with lateral strabismus.  Pupils 2 mm bilaterally and right eye with extraocular muscles intact  Neck: Neck supple. No tracheal deviation present. No thyromegaly present.  Cardiovascular:  Normal rate and regular rhythm.  No murmur heard. Pulmonary/Chest: Effort normal and breath sounds normal.  Abdominal: Soft. Bowel sounds are normal. He exhibits no distension, no pulsatile liver and no pulsatile midline mass. There is tenderness.  Tender at epigastrium and hypogastric area  Genitourinary: Testes normal and penis normal.  Musculoskeletal: Normal range of motion. He exhibits no edema or tenderness.  Right lower extremity dime sized open wound at medial aspect of foot immediately proximal to great toe.  DP pulse 2+ good capillary refill.  Surrounding redness or tenderness.  Neurological: He is alert and oriented to person, place, and time. Coordination normal.  Skin: Skin is warm and dry. No rash noted.  Psychiatric:  Anxious  Nursing note and vitals reviewed.    ED Treatments / Results  Labs (all labs ordered are listed, but only abnormal results are displayed) Labs Reviewed  CBC - Abnormal; Notable for the following components:      Result Value   WBC 12.2 (*)    RBC 3.98 (*)    Hemoglobin 12.1 (*)    HCT 35.9 (*)    All other components within normal limits  CBG MONITORING, ED - Abnormal; Notable for the following components:   Glucose-Capillary 232 (*)    All other components within normal limits  CULTURE, BLOOD (ROUTINE X 2)  CULTURE, BLOOD (ROUTINE X 2)  LIPASE, BLOOD  COMPREHENSIVE METABOLIC PANEL  URINALYSIS, ROUTINE W REFLEX MICROSCOPIC  I-STAT CG4 LACTIC ACID, ED    EKG  EKG Interpretation  Date/Time:  Tuesday July 14 2017 19:01:27 EST Ventricular Rate:  98 PR Interval:    QRS Duration: 73 QT Interval:  360 QTC Calculation: 460 R Axis:   29 Text Interpretation:  Sinus tachycardia Multiple ventricular premature complexes Low voltage, precordial leads Baseline wander in lead(s) I Premature ventricular complexes New since previous tracing Confirmed by Orlie Dakin [id number]) on 07/14/2017 7:37:35 PM       Radiology No results  found.  Procedures Procedures (including critical care time)  Medications Ordered in ED Medications  levofloxacin (LEVAQUIN) IVPB 750 mg (not administered)  aztreonam (AZACTAM) 2 g in dextrose 5 % 50 mL IVPB (not administered)  vancomycin (VANCOCIN) IVPB 1000 mg/200 mL premix (not administered)  ondansetron (ZOFRAN) injection 4 mg (  4 mg Intravenous Given 07/14/17 1930)  metoCLOPramide (REGLAN) injection 10 mg (10 mg Intravenous Given 07/14/17 1951)    X-rays viewed by me Results for orders placed or performed during the hospital encounter of 07/14/17  Blood Culture (routine x 2)  Result Value Ref Range   Specimen Description BLOOD RIGHT HAND    Special Requests      BOTTLES DRAWN AEROBIC AND ANAEROBIC Blood Culture results may not be optimal due to an inadequate volume of blood received in culture bottles   Culture PENDING    Report Status PENDING   Blood Culture (routine x 2)  Result Value Ref Range   Specimen Description BLOOD RIGHT FOREARM    Special Requests      BOTTLES DRAWN AEROBIC AND ANAEROBIC Blood Culture results may not be optimal due to an inadequate volume of blood received in culture bottles   Culture PENDING    Report Status PENDING   Lipase, blood  Result Value Ref Range   Lipase 39 11 - 51 U/L  Comprehensive metabolic panel  Result Value Ref Range   Sodium 138 135 - 145 mmol/L   Potassium 3.4 (L) 3.5 - 5.1 mmol/L   Chloride 103 101 - 111 mmol/L   CO2 25 22 - 32 mmol/L   Glucose, Bld 257 (H) 65 - 99 mg/dL   BUN 16 6 - 20 mg/dL   Creatinine, Ser 1.26 (H) 0.61 - 1.24 mg/dL   Calcium 9.3 8.9 - 10.3 mg/dL   Total Protein 7.5 6.5 - 8.1 g/dL   Albumin 3.6 3.5 - 5.0 g/dL   AST 21 15 - 41 U/L   ALT 18 17 - 63 U/L   Alkaline Phosphatase 66 38 - 126 U/L   Total Bilirubin 0.7 0.3 - 1.2 mg/dL   GFR calc non Af Amer >60 >60 mL/min   GFR calc Af Amer >60 >60 mL/min   Anion gap 10 5 - 15  CBC  Result Value Ref Range   WBC 12.2 (H) 4.0 - 10.5 K/uL   RBC 3.98 (L)  4.22 - 5.81 MIL/uL   Hemoglobin 12.1 (L) 13.0 - 17.0 g/dL   HCT 35.9 (L) 39.0 - 52.0 %   MCV 90.2 78.0 - 100.0 fL   MCH 30.4 26.0 - 34.0 pg   MCHC 33.7 30.0 - 36.0 g/dL   RDW 12.7 11.5 - 15.5 %   Platelets 180 150 - 400 K/uL  Urinalysis, Routine w reflex microscopic  Result Value Ref Range   Color, Urine YELLOW YELLOW   APPearance CLEAR CLEAR   Specific Gravity, Urine 1.013 1.005 - 1.030   pH 7.0 5.0 - 8.0   Glucose, UA >=500 (A) NEGATIVE mg/dL   Hgb urine dipstick SMALL (A) NEGATIVE   Bilirubin Urine NEGATIVE NEGATIVE   Ketones, ur NEGATIVE NEGATIVE mg/dL   Protein, ur >=300 (A) NEGATIVE mg/dL   Nitrite NEGATIVE NEGATIVE   Leukocytes, UA NEGATIVE NEGATIVE   RBC / HPF 6-30 0 - 5 RBC/hpf   WBC, UA 0-5 0 - 5 WBC/hpf   Bacteria, UA NONE SEEN NONE SEEN   Squamous Epithelial / LPF 0-5 (A) NONE SEEN   Hyaline Casts, UA PRESENT   Lactic acid, plasma  Result Value Ref Range   Lactic Acid, Venous 1.7 0.5 - 1.9 mmol/L  CBG monitoring, ED  Result Value Ref Range   Glucose-Capillary 232 (H) 65 - 99 mg/dL   Ct Abdomen Pelvis Wo Contrast  Result Date: 07/14/2017 CLINICAL DATA:  46 year old male  with nausea and vomiting abdominal pain. EXAM: CT ABDOMEN AND PELVIS WITHOUT CONTRAST TECHNIQUE: Multidetector CT imaging of the abdomen and pelvis was performed following the standard protocol without IV contrast. COMPARISON:  Abdominal radiograph dated 07/08/2014 and CT dated 12/22/2016 FINDINGS: Evaluation of this exam is limited in the absence of intravenous contrast. Lower chest: The visualized lung bases are clear. There is no intra-abdominal free air or free fluid. Hepatobiliary: No focal liver abnormality is seen. No gallstones, gallbladder wall thickening, or biliary dilatation. Pancreas: Unremarkable. No pancreatic ductal dilatation or surrounding inflammatory changes. Spleen: Normal in size without focal abnormality. Adrenals/Urinary Tract: The adrenal glands are unremarkable. 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 There is no hydronephrosis or nephrolithiasis on either side. 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 The visualized ureters and urinary bladder appear unremarkable. Stomach/Bowel: There is a moderate colonic stool burden. There is no bowel obstruction or active inflammation. The appendix is normal. Vascular/Lymphatic: The abdominal aorta and IVC are grossly unremarkable on this noncontrast CT. No portal venous gas. There is no adenopathy. Reproductive: The prostate and seminal vesicles are grossly unremarkable. Other: Small fat containing right inguinal hernia. Musculoskeletal: There is degenerative changes of the spine most prominent at L4-L5 with bridging osteophyte anteriorly. No acute osseous pathology. IMPRESSION: 1. No acute intra-abdominal or pelvic pathology. Moderate colonic stool burden. No bowel obstruction or active inflammation. Normal appendix. 2. No hydronephrosis or nephrolithiasis. Electronically Signed   By: Anner Crete M.D.   On: 07/14/2017 22:24   Ct Head Wo Contrast  Result Date: 07/14/2017 CLINICAL DATA:  Chest pain, shortness of breath, anxiety, abdominal pain, vision changes. EXAM: CT HEAD WITHOUT CONTRAST TECHNIQUE: Contiguous axial images were obtained from the base of the skull through the vertex without intravenous contrast. COMPARISON:  CT HEAD in January 26, 2017 FINDINGS: BRAIN: No intraparenchymal hemorrhage, mass effect nor midline shift. Mild parenchymal brain volume loss for age. No hydrocephalus . No acute large vascular territory infarcts. No abnormal extra-axial fluid collections. Basal cisterns are patent. VASCULAR: Trace calcific atherosclerosis carotid siphons. SKULL/SOFT TISSUES: No skull fracture. No significant soft tissue swelling. Similar RIGHT parietal scalp scarring. ORBITS/SINUSES: Status post LEFT ocular lens implant with similar probable reservoir, possible  glaucoma drainage device. Small bilateral mastoid effusions without air cell coalescence. Paranasal sinuses are well-aerated. OTHER: None. IMPRESSION: 1. No acute intracranial process. 2. Mild parenchymal brain volume loss for age. Electronically Signed   By: Elon Alas M.D.   On: 07/14/2017 22:25   Initial Impression / Assessment and Plan / ED Course  I have reviewed the triage vital signs and the nursing notes.  Pertinent labs & imaging results that were available during my care of the patient were reviewed by me and considered in my medical decision making (see chart for details).     Code sepsis called based on sirs criteria of temperature, pulse rate.   source of infection unclear.   8 PM patient complained of diminished vision in his left eye on reexamination he states he can see only light from the left eye he is unable to perceive light from the right eye which is baseline 8:40 PM requesting pain medicine.  States his eyesight is coming back to normal.  Nausea is improved after treatment with intravenous Reglan.  IV morphine ordered  1050 p.m. continues to complain of severe pain and nausea.  Additional IV Reglan and intravenous hydromorphone ordered. A 12:05 AM patient's pain and nausea much  improved.  After treatment with additional IV antiemetics, IV opioid pain medicine and intravenous antibiotics his vision is also improved in his left eye.  Stating he is able to see my hand but is unable to finger count which she reports is not his baseline.  Patient may need ophthalmologic consultation. Concerned the patient is febrile, uncertain of etiology of infection but he is chronically on Cipro.  With protracted vomiting he is unable to hold down antibiotics.  He is chronically on Cipro for foot infection leukocytosis and fever and sirs criteria consistent with sepsis.  Dr. Olevia Bowens consulted and will arrange for admission renal insufficiency is chronic.  Intravenous potassium ordered to  treat hypokalemia Final Clinical Impressions(s) / ED Diagnoses  Diagnoses #1 sepsis #2 hyperglycemia #3 hypokalemia #4 upper abdominal pain #5 vomiting #6 visual loss #7 chronic renal insufficiency #8 chronic wound of right foot CRITICAL CARE Performed by: Orlie Dakin Total critical care time: 40 minutes Critical care time was exclusive of separately billable procedures and treating other patients. Critical care was necessary to treat or prevent imminent or life-threatening deterioration. Critical care was time spent personally by me on the following activities: development of treatment plan with patient and/or surrogate as well as nursing, discussions with consultants, evaluation of patient's response to treatment, examination of patient, obtaining history from patient or surrogate, ordering and performing treatments and interventions, ordering and review of laboratory studies, ordering and review of radiographic studies, pulse oximetry and re-evaluation of patient's condition. Final diagnoses:  None    ED Discharge Orders    None       Orlie Dakin, MD 07/15/17 [id number]    Orlie Dakin, MD 07/15/17 [phone]

## 2017-07-14 NOTE — ED Notes (Signed)
EKG printed and given to Dr. Winfred Leeds

## 2017-07-14 NOTE — Progress Notes (Signed)
Pharmacy Note:  Initial antibiotic(s) regimen of Vancomycin, Aztreonam and Levaquin ordered by EDP to treat Sepsis.  Estimated Creatinine Clearance: 102.1 mL/min (A) (by C-G formula based on SCr of 1.26 mg/dL (H)).   Allergies  Allergen Reactions  . Donnatal [Pb-Hyoscy-Atropine-Scopol Er] Anaphylaxis    Reaction to GI cocktail  . Fish Allergy Anaphylaxis, Hives and Rash  . Haldol [Haloperidol Lactate] Other (See Comments)    Chest Pain  . Lidocaine Anaphylaxis    Reaction to GI cocktail  . Maalox [Calcium Carbonate Antacid] Anaphylaxis    Reaction to GI cocktail  . Aspirin Hives and Itching  . Bactrim [Sulfamethoxazole-Trimethoprim] Itching  . Keflex [Cephalexin] Hives, Itching and Nausea And Vomiting  . Penicillins Hives and Itching    Has patient had a PCN reaction causing immediate rash, facial/tongue/throat swelling, SOB or lightheadedness with hypotension: yes Has patient had a PCN reaction causing severe rash involving mucus membranes or skin necrosis: yes Has patient had a PCN reaction that required hospitalization no Has patient had a PCN reaction occurring within the last 10 years: yes If all of the above answers are "NO", then may proceed with Cephalospor  . Phenergan [Promethazine Hcl] Nausea And Vomiting    Vitals:   07/14/17 1856 07/14/17 1936  BP:  (!) 208/121  Pulse: 100 95  Resp: 20 13  Temp: (!) 100.4 F (38 C)   SpO2: 100% 100%    Anti-infectives (From admission, onward)   Start     Dose/Rate Route Frequency Ordered Stop   07/14/17 2000  levofloxacin (LEVAQUIN) IVPB 750 mg     750 mg 100 mL/hr over 90 Minutes Intravenous  Once 07/14/17 1959     07/14/17 2000  aztreonam (AZACTAM) 2 g in dextrose 5 % 50 mL IVPB     2 g 100 mL/hr over 30 Minutes Intravenous  Once 07/14/17 1959     07/14/17 2000  vancomycin (VANCOCIN) IVPB 1000 mg/200 mL premix     1,000 mg 200 mL/hr over 60 Minutes Intravenous  Once 07/14/17 1959       Plan: Initial dose(s) of  Vancomycin, Aztreonam and Levaquin X 1 ordered. F/U admission orders for further dosing if therapy continued.  [name], [facility] 07/14/2017 8:23 PM

## 2017-07-14 NOTE — ED Triage Notes (Signed)
Pt reports chest pain, sob, anxiety, n/v and abd pain since this morning.  Pt says feels like his having a panic attack.  Pt has  history of gastroparesis

## 2017-07-15 ENCOUNTER — Encounter (HOSPITAL_COMMUNITY): Payer: Self-pay | Admitting: *Deleted

## 2017-07-15 ENCOUNTER — Inpatient Hospital Stay (HOSPITAL_COMMUNITY): Payer: Medicare Other

## 2017-07-15 ENCOUNTER — Other Ambulatory Visit: Payer: Self-pay

## 2017-07-15 DIAGNOSIS — E11628 Type 2 diabetes mellitus with other skin complications: Secondary | ICD-10-CM | POA: Diagnosis present

## 2017-07-15 DIAGNOSIS — I10 Essential (primary) hypertension: Secondary | ICD-10-CM

## 2017-07-15 DIAGNOSIS — E876 Hypokalemia: Secondary | ICD-10-CM

## 2017-07-15 DIAGNOSIS — L089 Local infection of the skin and subcutaneous tissue, unspecified: Secondary | ICD-10-CM

## 2017-07-15 DIAGNOSIS — E785 Hyperlipidemia, unspecified: Secondary | ICD-10-CM

## 2017-07-15 LAB — BASIC METABOLIC PANEL
Anion gap: 9 (ref 5–15)
BUN: 18 mg/dL (ref 6–20)
CO2: 26 mmol/L (ref 22–32)
Calcium: 8.5 mg/dL — ABNORMAL LOW (ref 8.9–10.3)
Chloride: 101 mmol/L (ref 101–111)
Creatinine, Ser: 1.42 mg/dL — ABNORMAL HIGH (ref 0.61–1.24)
GFR calc Af Amer: >60 mL/min (ref >60)
GFR calc non Af Amer: 58 mL/min — ABNORMAL LOW (ref >60)
Glucose, Bld: 274 mg/dL — ABNORMAL HIGH (ref 65–99)
Potassium: 3.6 mmol/L (ref 3.5–5.1)
Sodium: 136 mmol/L (ref 135–145)

## 2017-07-15 LAB — GLUCOSE, CAPILLARY
Glucose-Capillary: 387 mg/dL — ABNORMAL HIGH (ref 65–99)
Glucose-Capillary: 239 mg/dL — ABNORMAL HIGH (ref 65–99)
Glucose-Capillary: 222 mg/dL — ABNORMAL HIGH (ref 65–99)
Glucose-Capillary: 88 mg/dL (ref 65–99)
Glucose-Capillary: 136 mg/dL — ABNORMAL HIGH (ref 65–99)

## 2017-07-15 LAB — CBC WITH DIFFERENTIAL/PLATELET
Basophils Absolute: 0 10*3/uL (ref 0.0–0.1)
Basophils Relative: 0 %
Eosinophils Absolute: 0 10*3/uL (ref 0.0–0.7)
Eosinophils Relative: 0 %
HCT: 32.1 % — ABNORMAL LOW (ref 39.0–52.0)
Hemoglobin: 11 g/dL — ABNORMAL LOW (ref 13.0–17.0)
Lymphocytes Relative: 17 %
Lymphs Abs: 1.5 10*3/uL (ref 0.7–4.0)
MCH: 31.1 pg (ref 26.0–34.0)
MCHC: 34.3 g/dL (ref 30.0–36.0)
MCV: 90.7 fL (ref 78.0–100.0)
Monocytes Absolute: 0.8 10*3/uL (ref 0.1–1.0)
Monocytes Relative: 9 %
Neutro Abs: 6.3 10*3/uL (ref 1.7–7.7)
Neutrophils Relative %: 74 %
Platelets: 151 10*3/uL (ref 150–400)
RBC: 3.54 MIL/uL — ABNORMAL LOW (ref 4.22–5.81)
RDW: 12.8 % (ref 11.5–15.5)
WBC: 8.6 10*3/uL (ref 4.0–10.5)

## 2017-07-15 LAB — LACTIC ACID, PLASMA: Lactic Acid, Venous: 1.6 mmol/L (ref 0.5–1.9)

## 2017-07-15 LAB — MAGNESIUM: Magnesium: 1.3 mg/dL — ABNORMAL LOW (ref 1.7–2.4)

## 2017-07-15 LAB — PREALBUMIN: Prealbumin: 21.4 mg/dL (ref 18–38)

## 2017-07-15 LAB — SURGICAL PCR SCREEN
MRSA, PCR: NEGATIVE
Staphylococcus aureus: NEGATIVE

## 2017-07-15 LAB — C-REACTIVE PROTEIN: CRP: <0.8 mg/dL (ref <1.0)

## 2017-07-15 LAB — PROTIME-INR
INR: 1.09
Prothrombin Time: 14 seconds (ref 11.4–15.2)

## 2017-07-15 LAB — SEDIMENTATION RATE: Sed Rate: 73 mm/hr — ABNORMAL HIGH (ref 0–16)

## 2017-07-15 MED ORDER — POTASSIUM CHLORIDE IN NACL 20-0.9 MEQ/L-% IV SOLN
INTRAVENOUS | Status: AC
Start: 2017-07-15 — End: 2017-07-16
  Administered 2017-07-15 – 2017-07-16 (×2): via INTRAVENOUS

## 2017-07-15 MED ORDER — INSULIN ASPART 100 UNIT/ML ~~LOC~~ SOLN: 5.0000 [IU] | Freq: Three times a day (TID) | SUBCUTANEOUS | Status: DC

## 2017-07-15 MED ORDER — INSULIN DETEMIR 100 UNIT/ML ~~LOC~~ SOLN
40.0000 [IU] | Freq: Every day | SUBCUTANEOUS | Status: DC
  Filled 2017-07-15: qty 0.4

## 2017-07-15 MED ORDER — INSULIN ASPART 100 UNIT/ML ~~LOC~~ SOLN: 6.0000 [IU] | Freq: Three times a day (TID) | SUBCUTANEOUS | Status: DC

## 2017-07-15 MED ORDER — SODIUM CHLORIDE 0.9 % IV BOLUS (SEPSIS)
1000.0000 mL | Freq: Once | INTRAVENOUS | Status: AC
  Administered 2017-07-15: 1000 mL via INTRAVENOUS

## 2017-07-15 MED ORDER — POLYETHYLENE GLYCOL 3350 17 G PO PACK
17.0000 g | PACK | Freq: Two times a day (BID) | ORAL | Status: DC
  Administered 2017-07-15 – 2017-07-18 (×3): 17 g via ORAL
  Filled 2017-07-15 (×5): qty 1

## 2017-07-15 MED ORDER — DEXTROSE 5 % IV SOLN
2.0000 g | Freq: Three times a day (TID) | INTRAVENOUS | Status: DC
  Administered 2017-07-15 – 2017-07-18 (×9): 2 g via INTRAVENOUS
  Filled 2017-07-15 (×16): qty 2

## 2017-07-15 MED ORDER — INSULIN GLARGINE 100 UNIT/ML ~~LOC~~ SOLN
25.0000 [IU] | Freq: Every day | SUBCUTANEOUS | Status: DC
  Administered 2017-07-16 – 2017-07-17 (×2): 25 [IU] via SUBCUTANEOUS
  Filled 2017-07-15 (×5): qty 0.25

## 2017-07-15 MED ORDER — SENNA 8.6 MG PO TABS
1.0000 | ORAL_TABLET | Freq: Two times a day (BID) | ORAL | Status: DC
  Administered 2017-07-15: 8.6 mg via ORAL
  Filled 2017-07-15 (×2): qty 1

## 2017-07-15 MED ORDER — LEVOFLOXACIN IN D5W 750 MG/150ML IV SOLN
750.0000 mg | INTRAVENOUS | Status: DC
  Administered 2017-07-15 – 2017-07-17 (×3): 750 mg via INTRAVENOUS
  Filled 2017-07-15 (×3): qty 150

## 2017-07-15 MED ORDER — HYDROXYZINE HCL 50 MG/ML IM SOLN
25.0000 mg | Freq: Once | INTRAMUSCULAR | Status: AC
  Administered 2017-07-15: 25 mg via INTRAMUSCULAR
  Filled 2017-07-15: qty 1

## 2017-07-15 MED ORDER — PRO-STAT SUGAR FREE PO LIQD
30.0000 mL | Freq: Three times a day (TID) | ORAL | Status: DC
  Administered 2017-07-15 (×2): 30 mL via ORAL
  Filled 2017-07-15 (×2): qty 30

## 2017-07-15 MED ORDER — PANTOPRAZOLE SODIUM 40 MG IV SOLR
40.0000 mg | INTRAVENOUS | Status: DC
  Administered 2017-07-15: 40 mg via INTRAVENOUS
  Filled 2017-07-15: qty 40

## 2017-07-15 MED ORDER — INSULIN ASPART 100 UNIT/ML ~~LOC~~ SOLN
0.0000 [IU] | Freq: Three times a day (TID) | SUBCUTANEOUS | Status: DC
  Administered 2017-07-15 – 2017-07-16 (×4): 7 [IU] via SUBCUTANEOUS
  Administered 2017-07-17: 4 [IU] via SUBCUTANEOUS

## 2017-07-15 MED ORDER — TIMOLOL MALEATE 0.5 % OP SOLN
1.0000 [drp] | Freq: Two times a day (BID) | OPHTHALMIC | Status: DC
  Administered 2017-07-15 – 2017-07-18 (×7): 1 [drp] via OPHTHALMIC
  Filled 2017-07-15: qty 5

## 2017-07-15 MED ORDER — BRIMONIDINE TARTRATE 0.15 % OP SOLN
1.0000 [drp] | Freq: Two times a day (BID) | OPHTHALMIC | Status: DC
  Administered 2017-07-15 – 2017-07-18 (×7): 1 [drp] via OPHTHALMIC
  Filled 2017-07-15: qty 5

## 2017-07-15 MED ORDER — INSULIN DETEMIR 100 UNIT/ML ~~LOC~~ SOLN
20.0000 [IU] | Freq: Every day | SUBCUTANEOUS | Status: DC
  Administered 2017-07-15: 20 [IU] via SUBCUTANEOUS
  Filled 2017-07-15 (×2): qty 0.2

## 2017-07-15 MED ORDER — INSULIN ASPART 100 UNIT/ML ~~LOC~~ SOLN
5.0000 [IU] | Freq: Once | SUBCUTANEOUS | Status: AC
  Administered 2017-07-15: 5 [IU] via SUBCUTANEOUS

## 2017-07-15 MED ORDER — HYDROCERIN EX CREA
TOPICAL_CREAM | Freq: Two times a day (BID) | CUTANEOUS | Status: DC
  Administered 2017-07-15 – 2017-07-18 (×6): via TOPICAL
  Filled 2017-07-15: qty 113

## 2017-07-15 MED ORDER — INSULIN DETEMIR 100 UNIT/ML ~~LOC~~ SOLN: 20.0000 [IU] | Freq: Every day | SUBCUTANEOUS | Status: DC

## 2017-07-15 MED ORDER — LORAZEPAM 0.5 MG PO TABS
0.5000 mg | ORAL_TABLET | Freq: Every evening | ORAL | Status: DC | PRN
  Administered 2017-07-15: 0.5 mg via ORAL
  Filled 2017-07-15: qty 1

## 2017-07-15 MED ORDER — INSULIN ASPART 100 UNIT/ML ~~LOC~~ SOLN
8.0000 [IU] | Freq: Three times a day (TID) | SUBCUTANEOUS | Status: DC
Start: 2017-07-16 — End: 2017-07-18

## 2017-07-15 MED ORDER — GABAPENTIN 300 MG PO CAPS
600.0000 mg | ORAL_CAPSULE | Freq: Three times a day (TID) | ORAL | Status: DC | PRN
  Administered 2017-07-15: 600 mg via ORAL
  Filled 2017-07-15: qty 2

## 2017-07-15 MED ORDER — ATORVASTATIN CALCIUM 10 MG PO TABS
10.0000 mg | ORAL_TABLET | Freq: Every day | ORAL | Status: DC
  Administered 2017-07-15 – 2017-07-18 (×3): 10 mg via ORAL
  Filled 2017-07-15 (×4): qty 1

## 2017-07-15 MED ORDER — INSULIN ASPART 100 UNIT/ML ~~LOC~~ SOLN
10.0000 [IU] | Freq: Three times a day (TID) | SUBCUTANEOUS | Status: DC
  Administered 2017-07-15: 10 [IU] via SUBCUTANEOUS

## 2017-07-15 MED ORDER — METOCLOPRAMIDE HCL 5 MG/ML IJ SOLN
10.0000 mg | Freq: Four times a day (QID) | INTRAMUSCULAR | Status: AC
  Administered 2017-07-15 – 2017-07-16 (×4): 10 mg via INTRAVENOUS
  Filled 2017-07-15 (×4): qty 2

## 2017-07-15 MED ORDER — AMLODIPINE BESYLATE 5 MG PO TABS
5.0000 mg | ORAL_TABLET | Freq: Every day | ORAL | Status: DC
  Administered 2017-07-15 – 2017-07-17 (×3): 5 mg via ORAL
  Filled 2017-07-15 (×3): qty 1

## 2017-07-15 MED ORDER — INSULIN GLARGINE 100 UNIT/ML ~~LOC~~ SOLN: 30.0000 [IU] | Freq: Every day | SUBCUTANEOUS | Status: DC

## 2017-07-15 MED ORDER — POTASSIUM CHLORIDE IN NACL 20-0.45 MEQ/L-% IV SOLN
INTRAVENOUS | Status: DC
  Administered 2017-07-15: 03:00:00 via INTRAVENOUS
  Filled 2017-07-15 (×4): qty 1000

## 2017-07-15 MED ORDER — ENOXAPARIN SODIUM 80 MG/0.8ML ~~LOC~~ SOLN
70.0000 mg | SUBCUTANEOUS | Status: DC
  Administered 2017-07-15 – 2017-07-18 (×3): 70 mg via SUBCUTANEOUS
  Filled 2017-07-15 (×3): qty 0.8

## 2017-07-15 MED ORDER — METOPROLOL SUCCINATE ER 50 MG PO TB24
50.0000 mg | ORAL_TABLET | Freq: Every day | ORAL | Status: DC
  Administered 2017-07-15 – 2017-07-17 (×3): 50 mg via ORAL
  Filled 2017-07-15 (×3): qty 1

## 2017-07-15 MED ORDER — SERTRALINE HCL 50 MG PO TABS
100.0000 mg | ORAL_TABLET | Freq: Every day | ORAL | Status: DC
  Administered 2017-07-15 – 2017-07-18 (×3): 100 mg via ORAL
  Filled 2017-07-15 (×4): qty 2

## 2017-07-15 MED ORDER — GABAPENTIN 600 MG PO TABS
600.0000 mg | ORAL_TABLET | Freq: Three times a day (TID) | ORAL | Status: DC | PRN
  Filled 2017-07-15 (×3): qty 1

## 2017-07-15 MED ORDER — PRO-STAT SUGAR FREE PO LIQD
30.0000 mL | Freq: Two times a day (BID) | ORAL | Status: DC
Start: 2017-07-15 — End: 2017-07-15

## 2017-07-15 MED ORDER — ONDANSETRON HCL 4 MG/2ML IJ SOLN
4.0000 mg | Freq: Four times a day (QID) | INTRAMUSCULAR | Status: DC | PRN
  Administered 2017-07-15 – 2017-07-18 (×5): 4 mg via INTRAVENOUS
  Filled 2017-07-15 (×5): qty 2

## 2017-07-15 MED ORDER — POLYETHYLENE GLYCOL 3350 17 G PO PACK: 17.0000 g | PACK | Freq: Every day | ORAL | Status: DC | PRN

## 2017-07-15 MED ORDER — INSULIN GLARGINE 100 UNIT/ML ~~LOC~~ SOLN
40.0000 [IU] | Freq: Every day | SUBCUTANEOUS | Status: DC
  Filled 2017-07-15: qty 0.4

## 2017-07-15 MED ORDER — VANCOMYCIN HCL IN DEXTROSE 1-5 GM/200ML-% IV SOLN
1000.0000 mg | Freq: Two times a day (BID) | INTRAVENOUS | Status: DC
  Administered 2017-07-15 – 2017-07-18 (×6): 1000 mg via INTRAVENOUS
  Filled 2017-07-15 (×7): qty 200

## 2017-07-15 MED ORDER — PANTOPRAZOLE SODIUM 40 MG PO TBEC
40.0000 mg | DELAYED_RELEASE_TABLET | Freq: Two times a day (BID) | ORAL | Status: DC
  Administered 2017-07-16 – 2017-07-18 (×5): 40 mg via ORAL
  Filled 2017-07-15 (×5): qty 1

## 2017-07-15 NOTE — Clinical Social Work Note (Signed)
CSW received consult for medication assistance at dc. Will notify RN CM who can assist with this need. CSW will be available if other needs arise. LCSW will sign off for now.

## 2017-07-15 NOTE — Progress Notes (Signed)
PROGRESS NOTE   [name]  MVE:[id number]  DOB: 19-Mar-1971  DOA: 07/14/2017 PCP: Tawni Carnes, PA-C   Brief Admission Hx: Timothy Clark is a 46 y.o. male with medical history significant of chronic abdominal pain, esophagitis, gastritis, gastroparesis, GERD, history of H. pylori infection, hiccups, recurring nausea and vomiting, hemorrhage, glaucoma, hypertension, hyperlipidemia, type 2 diabetes who is coming to the emergency department with complaints of abdominal pain, burning chest pain, nausea and 10 or more episodes of emesis since this morning.  He was admitted for sepsis, cellulitis and constipation.   MDM/Assessment & Plan:   1. Sepsis secondary to cellulitis and osteomyelitis of right foot - MRI pending but plain film imaging of right foot highly suspicious for osteomyelitis.  Continue IV antibiotics, obtain podiatry consultation.   2. 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  Check A1c.   3. Insensate feet - Pt is at very HIGH RISK for amputation.  4. Dyslipidemia - likely secondary to poorly controlled diabetes mellitus.  Resume home atorvastatin.  5. Glaucoma - resume home eye drops.  6. Diabetic gastroparesis - IVFs, IV nausea meds and IV metoclopromide ordered.  7. Hypokalemia - repleting.  8. GERD - IV protonix ordered.  9. Moderate constipation - likely causing most of symptoms, laxative ordered.  10. Anemia unspecified - monitor Hg closely.  Repeat CBC in AM.    DVT prophylaxis: SCDs Code Status: Full  Family Communication: none present Disposition Plan: TBD   Consultants:  podiatry  Subjective: Pt c/o not receiving his home eye drops for glaucoma.    Objective: Vitals:   07/15/17 0004 07/15/17 0126 07/15/17 0200 07/15/17 0517  BP:  137/81  (!) 156/109  Pulse:  91  97  Resp:  19  18  Temp: 98.7 F (37.1 C) 98.6 F (37 C)  98 F (36.7 C)  TempSrc: Oral  Oral  Oral  SpO2:  99%  100%  Weight:   132.5 kg (292 lb 1.8 oz)   Height:   6' (1.829 m)     Intake/Output Summary (Last 24 hours) at 07/15/2017 1017 Last data filed at 07/15/2017 0600 Gross per 24 hour  Intake 1326.67 ml  Output -  Net 1326.67 ml   Filed Weights   07/14/17 1855 07/15/17 0200  Weight: 132.5 kg (292 lb) 132.5 kg (292 lb 1.8 oz)   REVIEW OF SYSTEMS  As per history otherwise all reviewed and reported negative  Exam:  General exam: awake, alert, NAD. Strabismus noted.  Dry MM. Respiratory system: Clear. No increased work of breathing. Cardiovascular system: S1 & S2 heard, RRR. No JVD, murmurs, gallops, clicks or pedal edema. Gastrointestinal system: Abdomen is nondistended, soft and nontender. Normal bowel sounds heard. Central nervous system: Alert and oriented. No focal neurological deficits. Extremities: positive right foot medial aspect ulcer with seropurulent discharge  Data Reviewed: Basic Metabolic Panel: Recent Labs  Lab 07/14/17 1927 07/15/17 0620  NA 138 136  K 3.4* 3.6  CL 103 101  CO2 25 26  GLUCOSE 257* 274*  BUN 16 18  CREATININE 1.26* 1.42*  CALCIUM 9.3 8.5*  MG  --  1.3*   Liver Function Tests: Recent Labs  Lab 07/14/17 1927  AST 21  ALT 18  ALKPHOS 66  BILITOT 0.7  PROT 7.5  ALBUMIN 3.6   Recent Labs  Lab 07/14/17 1927  LIPASE 39   No results for input(s): AMMONIA in the last 168 hours.  CBC: Recent Labs  Lab 07/14/17 1927 07/15/17 0620  WBC 12.2* 8.6  NEUTROABS  --  6.3  HGB 12.1* 11.0*  HCT 35.9* 32.1*  MCV 90.2 90.7  PLT 180 151   Cardiac Enzymes: No results for input(s): CKTOTAL, CKMB, CKMBINDEX, TROPONINI in the last 168 hours. CBG (last 3)  Recent Labs    07/14/17 1857 07/15/17 0149 07/15/17 0811  GLUCAP 232* 387* 239*   Recent Results (from the past 240 hour(s))  Blood Culture (routine x 2)     Status: None (Preliminary result)   Collection Time: 07/14/17  8:18 PM  Result Value Ref Range Status     Specimen Description BLOOD RIGHT HAND  Final   Special Requests   Final    BOTTLES DRAWN AEROBIC AND ANAEROBIC Blood Culture results may not be optimal due to an inadequate volume of blood received in culture bottles   Culture NO GROWTH < 12 HOURS  Final   Report Status PENDING  Incomplete  Blood Culture (routine x 2)     Status: None (Preliminary result)   Collection Time: 07/14/17  8:18 PM  Result Value Ref Range Status   Specimen Description BLOOD RIGHT FOREARM  Final   Special Requests   Final    BOTTLES DRAWN AEROBIC AND ANAEROBIC Blood Culture results may not be optimal due to an inadequate volume of blood received in culture bottles   Culture NO GROWTH < 12 HOURS  Final   Report Status PENDING  Incomplete     Studies: Ct Abdomen Pelvis Wo Contrast  Result Date: 07/14/2017 CLINICAL DATA:  46 year old male with nausea and vomiting abdominal pain. EXAM: CT ABDOMEN AND PELVIS WITHOUT CONTRAST TECHNIQUE: Multidetector CT imaging of the abdomen and pelvis was performed following the standard protocol without IV contrast. COMPARISON:  Abdominal radiograph dated 07/08/2014 and CT dated 12/22/2016 FINDINGS: Evaluation of this exam is limited in the absence of intravenous contrast. Lower chest: The visualized lung bases are clear. There is no intra-abdominal free air or free fluid. Hepatobiliary: No focal liver abnormality is seen. No gallstones, gallbladder wall thickening, or biliary dilatation. Pancreas: Unremarkable. No pancreatic ductal dilatation or surrounding inflammatory changes. Spleen: Normal in size without focal abnormality. Adrenals/Urinary Tract: The adrenal glands are unremarkable. 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 There is no hydronephrosis or nephrolithiasis on either side. 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 The visualized ureters and urinary bladder appear unremarkable. Stomach/Bowel: There is a moderate  colonic stool burden. There is no bowel obstruction or active inflammation. The appendix is normal. Vascular/Lymphatic: The abdominal aorta and IVC are grossly unremarkable on this noncontrast CT. No portal venous gas. There is no adenopathy. Reproductive: The prostate and seminal vesicles are grossly unremarkable. Other: Small fat containing right inguinal hernia. Musculoskeletal: There is degenerative changes of the spine most prominent at L4-L5 with bridging osteophyte anteriorly. No acute osseous pathology. IMPRESSION: 1. No acute intra-abdominal or pelvic pathology. Moderate colonic stool burden. No bowel obstruction or active inflammation. Normal appendix. 2. No hydronephrosis or nephrolithiasis. Electronically Signed   By: Anner Crete M.D.   On: 07/14/2017 22:24   Ct Head Wo Contrast  Result Date: 07/14/2017 CLINICAL DATA:  Chest pain, shortness of breath, anxiety, abdominal pain, vision changes. EXAM: CT HEAD WITHOUT CONTRAST TECHNIQUE: Contiguous axial images were obtained from the base of the skull through the vertex without intravenous contrast. COMPARISON:  CT HEAD in January 26, 2017 FINDINGS: BRAIN: No intraparenchymal hemorrhage,  mass effect nor midline shift. Mild parenchymal brain volume loss for age. No hydrocephalus . No acute large vascular territory infarcts. No abnormal extra-axial fluid collections. Basal cisterns are patent. VASCULAR: Trace calcific atherosclerosis carotid siphons. SKULL/SOFT TISSUES: No skull fracture. No significant soft tissue swelling. Similar RIGHT parietal scalp scarring. ORBITS/SINUSES: Status post LEFT ocular lens implant with similar probable reservoir, possible glaucoma drainage device. Small bilateral mastoid effusions without air cell coalescence. Paranasal sinuses are well-aerated. OTHER: None. IMPRESSION: 1. No acute intracranial process. 2. Mild parenchymal brain volume loss for age. Electronically Signed   By: Elon Alas M.D.   On: 07/14/2017  22:25   Mr Foot Right Wo Contrast  Result Date: 07/15/2017 CLINICAL DATA:  Diabetic patient with a chronic, nonhealing wound along the distal first metatarsal. Osteomyelitis in the distal first metatarsal on prior MRI. EXAM: MRI OF THE RIGHT FOREFOOT WITHOUT CONTRAST TECHNIQUE: Multiplanar, multisequence MR imaging of the right forefoot was performed. No intravenous contrast was administered. COMPARISON:  MRI right foot 03/19/2017. Plain films right foot 11/18/2016 and 07/14/2017. FINDINGS: This examination was ordered with and without contrast but contrast could not be given due to the patient's renal insufficiency. Bones/Joint/Cartilage There is marrow edema with corresponding decreased T1 signal in approximately the distal 4 cm of the first metatarsal, throughout the proximal phalanx of the great toe and in both the medial and lateral sesamoids of the first MTP joint. Bony destructive change along the lateral cortex of the distal first metatarsal is identified. There is a first MTP joint effusion. Ligaments Intact. Muscles and Tendons There is edema throughout intrinsic musculature of the foot. No intramuscular fluid collection is identified. Soft tissues A large skin ulceration is seen along the medial margin of the first metatarsal head. Fluid collection in the first intermetatarsal space measures 1.3 cm craniocaudal by 0.5 cm transverse by 1 cm long. No other focal fluid collection is identified. IMPRESSION: Markedly worsened appearance of the patient's right foot since the comparison MRI. Abnormal marrow signal in the distal 4 cm of the first metatarsal, throughout the proximal phalanx of the great toe and in both the medial and lateral sesamoids of the first MTP joint is consistent with osteomyelitis. First MTP joint effusion is likely due to septic joint. Intense subcutaneous edema over the foot consistent with cellulitis. Fluid in the first intermetatarsal space is consistent with bursitis, possibly  septic. Large skin ulceration adjacent to the first metatarsal head is noted. Electronically Signed   By: Inge Rise M.D.   On: 07/15/2017 09:56   Dg Foot Complete Right  Result Date: 07/15/2017 CLINICAL DATA:  46 y/o M; laceration to medial aspect of right foot. EXAM: RIGHT FOOT COMPLETE - 3+ VIEW COMPARISON:  02/25/2017 right foot radiographs. FINDINGS: Bony defect of medial aspect of the first metatarsal head with a bone displaced medially in the soft tissues as well as overlying soft tissue irregularity. Joint spaces are well maintained. Vascular calcifications noted. Small dorsal calcaneal enthesophyte. IMPRESSION: Bony defect of medial aspect of first metatarsal head with a bone displaced medially into soft tissues. In the setting of trauma this may represent a soft tissue and bone laceration. Destructive changes of osteomyelitis are possible in the setting of infection. Electronically Signed   By: Kristine Garbe M.D.   On: 07/15/2017 00:26   Scheduled Meds: . brimonidine  1 drop Both Eyes BID  . enoxaparin (LOVENOX) injection  70 mg Subcutaneous Q24H  . hydrocerin   Topical BID  . insulin aspart  0-20 Units Subcutaneous TID WC  . insulin aspart  6 Units Subcutaneous TID WC  . insulin detemir  20 Units Subcutaneous QHS  . metoCLOPramide (REGLAN) injection  10 mg Intravenous Q6H  . pantoprazole (PROTONIX) IV  40 mg Intravenous Q24H  . polyethylene glycol  17 g Oral BID   Continuous Infusions: . 0.45 % NaCl with KCl 20 mEq / L 100 mL/hr at 07/15/17 0244  . aztreonam Stopped (07/15/17 1013)  . levofloxacin (LEVAQUIN) IV    . vancomycin Stopped (07/15/17 1013)    Principal Problem:   Sepsis due to cellulitis [facility]) Active Problems:   Diabetes mellitus type 2 with complications, uncontrolled ([place])   Hyperlipidemia   Depression   Essential hypertension   Gastroparesis due to DM ([facility])   Hypokalemia   GERD (gastroesophageal reflux disease)   Glaucoma   Anemia    Diabetic foot infection ([place])   Time spent:   Irwin Brakeman, MD, FAAFP [facility] Pager [phone]  If 7PM-7AM, please contact night-coverage [email or web address] Password [email or web address] 07/15/2017, 10:17 AM    LOS: 1 day

## 2017-07-15 NOTE — Progress Notes (Signed)
Pharmacy Antibiotic Note  Timothy Clark is a 46 y.o. male admitted on 07/14/2017 with sepsis.  Pharmacy has been consulted for Vancomycin, Aztreonam, and Levaquin dosing. Possible Osteomyelitis  Plan: Vancomycin 2000mg  loading dose given in ED, then 1000mg   IV every 12 hours.  Goal trough 15-20 mcg/mL.  Aztreonam 2gm IV q8h Levaquin 750mg  IV q24h F/U cxs and clinical progress Monitor V/S, labs and levels as indicated  Height: 6' (182.9 cm) Weight: 292 lb 1.8 oz (132.5 kg) IBW/kg (Calculated) : 77.6  Temp (24hrs), Avg:98.9 F (37.2 C), Min:98 F (36.7 C), Max:100.4 F (38 C)  Recent Labs  Lab 07/14/17 1927 07/14/17 2304 07/15/17 0057 07/15/17 0620  WBC 12.2*  --   --  8.6  CREATININE 1.26*  --   --  1.42*  LATICACIDVEN  --  1.7 1.6  --     Estimated Creatinine Clearance: 90.6 mL/min (A) (by C-G formula based on SCr of 1.42 mg/dL (H)).    Allergies  Allergen Reactions  . Donnatal [Pb-Hyoscy-Atropine-Scopol Er] Anaphylaxis    Reaction to GI cocktail  . Fish Allergy Anaphylaxis, Hives and Rash  . Haldol [Haloperidol Lactate] Other (See Comments)    Chest Pain  . Lidocaine Anaphylaxis    Reaction to GI cocktail  . Maalox [Calcium Carbonate Antacid] Anaphylaxis    Reaction to GI cocktail  . Aspirin Hives and Itching  . Bactrim [Sulfamethoxazole-Trimethoprim] Itching  . Keflex [Cephalexin] Hives, Itching and Nausea And Vomiting  . Penicillins Hives and Itching    Has patient had a PCN reaction causing immediate rash, facial/tongue/throat swelling, SOB or lightheadedness with hypotension: yes Has patient had a PCN reaction causing severe rash involving mucus membranes or skin necrosis: yes Has patient had a PCN reaction that required hospitalization no Has patient had a PCN reaction occurring within the last 10 years: yes If all of the above answers are "NO", then may proceed with Cephalospor  . Phenergan [Promethazine Hcl] Nausea And Vomiting    Antimicrobials this  admission: Vancomycin 12/4 >>  Aztreonam 12/4 >> Levaquin 12/4 >>   Dose adjustments this admission: N/A  Microbiology results: 12/4 BCx: pending 06/10/17 MRSA PCR: negative  Thank you for allowing pharmacy to be a part of this patient's care.  Isac Sarna, BS Pharm D, [place] Clinical Pharmacist Pager [phone] 07/15/2017 9:04 AM

## 2017-07-15 NOTE — Consult Note (Signed)
[place] Nurse wound consult note Reason for Consult:Chronic, nonhealing full thickness wound on right foot, lateral aspect of hallux.  Patient has been followed by outpatient WCCs locally and in [place], [place] for HBOT and has had 22 treatments to date.  He had wound debridement yesterday. Wound type:Neuropathic, infectious Pressure Injury POA: N/A Measurement:1.5cm x 2cm with depth undetermined due to the presence of necrotic tissue Wound bed: Red, wet. Drainage (amount, consistency, odor) serosanguinous, small to moderate amount on old dressing. Periwound: Macerated to 2cm in the periwound tissue, otherwise dry and with evidence of venous insufficiency (hemosiderin staining, mild edema, plaque-like deposits on pretibial area and bilateral knees. Dressing procedure/placement/frequency: Patient reports a negative MRI for osteomyelitis 2 months ago and is just back from a repeat MRI at the time of my assessment. I will implement a POC that includes a topical antimicrobial dressing for its local donation of silver and for absorption of exudate (Aquacel Ag+). Nursing has been provided with guidance for skin care to the bilateral LEs and feet and for floatation of the heels due to risk for pressure injury. Podiatry consult has been ordered but they have not yet seen.  Their orders will supercede mine and I defer to their expertise.  [place] nursing team will not follow, but will remain available to this patient, the nursing and medical teams.  Please re-consult if needed. Thanks, Maudie Flakes, MSN, RN, [place], Arther Abbott  Pager# [phone]

## 2017-07-15 NOTE — Progress Notes (Addendum)
Noninvasive arterial studies performed at [facility] on 12/02/2016 received and reviewed.  Multiple levels of noncompressible vessels.  Doppler revealed triphasic PT and biphasic DP of the right lower extremity.[name]

## 2017-07-15 NOTE — Progress Notes (Signed)
Podiatry Progress Note  Subjective No change since this AM.  Objective Vital signs in last 24 hours:   Temp:  [98 F (36.7 C)-100.4 F (38 C)] 98 F (36.7 C) (12/05 0517) Pulse Rate:  [89-102] 97 (12/05 0517) Resp:  [13-22] 18 (12/05 0517) BP: (137-208)/(81-121) 156/109 (12/05 0517) SpO2:  [96 %-100 %] 100 % (12/05 0517) Weight:  [292 lb (132.5 kg)-292 lb 1.8 oz (132.5 kg)] 292 lb 1.8 oz (132.5 kg) (12/05 0200)  DRESSING:  Clean, dry intact dressing on right foot.  No strikethrough.  Dressing was not removed.  Lab/Test Results  Recent Labs    07/14/17 1927 07/15/17 0620  WBC 12.2* 8.6  HGB 12.1* 11.0*  HCT 35.9* 32.1*  PLT 180 151  NA 138 136  K 3.4* 3.6  CL 103 101  CO2 25 26  BUN 16 18  CREATININE 1.26* 1.42*  GLUCOSE 257* 274*  CALCIUM 9.3 8.5*    Recent Results (from the past 240 hour(s))  Blood Culture (routine x 2)     Status: None (Preliminary result)   Collection Time: 07/14/17  8:18 PM  Result Value Ref Range Status   Specimen Description BLOOD RIGHT HAND  Final   Special Requests   Final    BOTTLES DRAWN AEROBIC AND ANAEROBIC Blood Culture results may not be optimal due to an inadequate volume of blood received in culture bottles   Culture NO GROWTH < 12 HOURS  Final   Report Status PENDING  Incomplete  Blood Culture (routine x 2)     Status: None (Preliminary result)   Collection Time: 07/14/17  8:18 PM  Result Value Ref Range Status   Specimen Description BLOOD RIGHT FOREARM  Final   Special Requests   Final    BOTTLES DRAWN AEROBIC AND ANAEROBIC Blood Culture results may not be optimal due to an inadequate volume of blood received in culture bottles   Culture NO GROWTH < 12 HOURS  Final   Report Status PENDING  Incomplete     Ct Abdomen Pelvis Wo Contrast  Result Date: 07/14/2017 CLINICAL DATA:  46 year old male with nausea and vomiting abdominal pain. EXAM: CT ABDOMEN AND PELVIS WITHOUT CONTRAST TECHNIQUE: Multidetector CT imaging of the  abdomen and pelvis was performed following the standard protocol without IV contrast. COMPARISON:  Abdominal radiograph dated 07/08/2014 and CT dated 12/22/2016 FINDINGS: Evaluation of this exam is limited in the absence of intravenous contrast. Lower chest: The visualized lung bases are clear. There is no intra-abdominal free air or free fluid. Hepatobiliary: No focal liver abnormality is seen. No gallstones, gallbladder wall thickening, or biliary dilatation. Pancreas: Unremarkable. No pancreatic ductal dilatation or surrounding inflammatory changes. Spleen: Normal in size without focal abnormality. Adrenals/Urinary Tract: The adrenal glands are unremarkable. 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 There is no hydronephrosis or nephrolithiasis on either side. 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 The visualized ureters and urinary bladder appear unremarkable. Stomach/Bowel: There is a moderate colonic stool burden. There is no bowel obstruction or active inflammation. The appendix is normal. Vascular/Lymphatic: The abdominal aorta and IVC are grossly unremarkable on this noncontrast CT. No portal venous gas. There is no adenopathy. Reproductive: The prostate and seminal vesicles are grossly unremarkable. Other: Small fat containing right inguinal hernia. Musculoskeletal: There is degenerative changes of the spine most prominent at L4-L5 with bridging osteophyte anteriorly. No acute osseous pathology. IMPRESSION: 1. No acute intra-abdominal or pelvic pathology. Moderate colonic stool burden.  No bowel obstruction or active inflammation. Normal appendix. 2. No hydronephrosis or nephrolithiasis. Electronically Signed   By: Anner Crete M.D.   On: 07/14/2017 22:24   Ct Head Wo Contrast  Result Date: 07/14/2017 CLINICAL DATA:  Chest pain, shortness of breath, anxiety, abdominal pain, vision changes. EXAM: CT HEAD WITHOUT CONTRAST TECHNIQUE:  Contiguous axial images were obtained from the base of the skull through the vertex without intravenous contrast. COMPARISON:  CT HEAD in January 26, 2017 FINDINGS: BRAIN: No intraparenchymal hemorrhage, mass effect nor midline shift. Mild parenchymal brain volume loss for age. No hydrocephalus . No acute large vascular territory infarcts. No abnormal extra-axial fluid collections. Basal cisterns are patent. VASCULAR: Trace calcific atherosclerosis carotid siphons. SKULL/SOFT TISSUES: No skull fracture. No significant soft tissue swelling. Similar RIGHT parietal scalp scarring. ORBITS/SINUSES: Status post LEFT ocular lens implant with similar probable reservoir, possible glaucoma drainage device. Small bilateral mastoid effusions without air cell coalescence. Paranasal sinuses are well-aerated. OTHER: None. IMPRESSION: 1. No acute intracranial process. 2. Mild parenchymal brain volume loss for age. Electronically Signed   By: Elon Alas M.D.   On: 07/14/2017 22:25   Mr Foot Right Wo Contrast  Result Date: 07/15/2017 CLINICAL DATA:  Diabetic patient with a chronic, nonhealing wound along the distal first metatarsal. Osteomyelitis in the distal first metatarsal on prior MRI. EXAM: MRI OF THE RIGHT FOREFOOT WITHOUT CONTRAST TECHNIQUE: Multiplanar, multisequence MR imaging of the right forefoot was performed. No intravenous contrast was administered. COMPARISON:  MRI right foot 03/19/2017. Plain films right foot 11/18/2016 and 07/14/2017. FINDINGS: This examination was ordered with and without contrast but contrast could not be given due to the patient's renal insufficiency. Bones/Joint/Cartilage There is marrow edema with corresponding decreased T1 signal in approximately the distal 4 cm of the first metatarsal, throughout the proximal phalanx of the great toe and in both the medial and lateral sesamoids of the first MTP joint. Bony destructive change along the lateral cortex of the distal first metatarsal is  identified. There is a first MTP joint effusion. Ligaments Intact. Muscles and Tendons There is edema throughout intrinsic musculature of the foot. No intramuscular fluid collection is identified. Soft tissues A large skin ulceration is seen along the medial margin of the first metatarsal head. Fluid collection in the first intermetatarsal space measures 1.3 cm craniocaudal by 0.5 cm transverse by 1 cm long. No other focal fluid collection is identified. IMPRESSION: Markedly worsened appearance of the patient's right foot since the comparison MRI. Abnormal marrow signal in the distal 4 cm of the first metatarsal, throughout the proximal phalanx of the great toe and in both the medial and lateral sesamoids of the first MTP joint is consistent with osteomyelitis. First MTP joint effusion is likely due to septic joint. Intense subcutaneous edema over the foot consistent with cellulitis. Fluid in the first intermetatarsal space is consistent with bursitis, possibly septic. Large skin ulceration adjacent to the first metatarsal head is noted. Electronically Signed   By: Inge Rise M.D.   On: 07/15/2017 09:56   US Arterial Abi (screening Lower Extremity)  Result Date: 07/15/2017 CLINICAL DATA:  Diabetic foot wound, poor healing EXAM: NONINVASIVE PHYSIOLOGIC VASCULAR STUDY OF BILATERAL LOWER EXTREMITIES TECHNIQUE: Evaluation of both lower extremities were performed at rest, including calculation of ankle-brachial indices with single level Doppler, pressure and pulse volume recording. COMPARISON:  12/06/2016 FINDINGS: Right ABI:  Not obtainable, vessels are noncompressible Left ABI:  Not obtainable, vessels are noncompressible Right Lower Extremity:  Normal arterial waveforms  at the ankle. Left Lower Extremity:  Normal arterial waveforms at the ankle. Accurate ABIs cannot be obtained secondary to noncompressible vessels at the ankle. IMPRESSION: ABIs cannot be obtained secondary to vessel noncompressibility No  significant waveform abnormality involving the posterior tibial and dorsalis pedis arteries bilaterally. Electronically Signed   By: Jerilynn Mages.  Shick M.D.   On: 07/15/2017 12:44   Dg Foot Complete Right  Result Date: 07/15/2017 CLINICAL DATA:  46 y/o M; laceration to medial aspect of right foot. EXAM: RIGHT FOOT COMPLETE - 3+ VIEW COMPARISON:  02/25/2017 right foot radiographs. FINDINGS: Bony defect of medial aspect of the first metatarsal head with a bone displaced medially in the soft tissues as well as overlying soft tissue irregularity. Joint spaces are well maintained. Vascular calcifications noted. Small dorsal calcaneal enthesophyte. IMPRESSION: Bony defect of medial aspect of first metatarsal head with a bone displaced medially into soft tissues. In the setting of trauma this may represent a soft tissue and bone laceration. Destructive changes of osteomyelitis are possible in the setting of infection. Electronically Signed   By: Kristine Garbe M.D.   On: 07/15/2017 00:26    Medications Scheduled Meds: . amLODipine  5 mg Oral Daily  . atorvastatin  10 mg Oral Daily  . brimonidine  1 drop Both Eyes BID  . enoxaparin (LOVENOX) injection  70 mg Subcutaneous Q24H  . hydrocerin   Topical BID  . insulin aspart  0-20 Units Subcutaneous TID WC  . insulin aspart  10 Units Subcutaneous TID WC  . insulin glargine  40 Units Subcutaneous QHS  . metoCLOPramide (REGLAN) injection  10 mg Intravenous Q6H  . metoprolol succinate  50 mg Oral Daily  . [START ON 07/16/2017] pantoprazole  40 mg Oral BID AC  . polyethylene glycol  17 g Oral BID  . senna  1 tablet Oral BID  . sertraline  100 mg Oral Daily  . timolol  1 drop Both Eyes BID   Continuous Infusions: . 0.9 % NaCl with KCl 20 mEq / L 60 mL/hr at 07/15/17 1143  . aztreonam Stopped (07/15/17 1013)  . levofloxacin (LEVAQUIN) IV    . vancomycin Stopped (07/15/17 1013)   PRN Meds:.gabapentin, LORazepam, ondansetron (ZOFRAN) IV  Assessment 1.   Sepsis with cellulitis of the right foot. 2.  Osteomylitis first ray of right foot. 3.  Ulceration of right foot with necrosis. 4.  Diabetes mellitus with peripheral neuropathy.  Plan Explained MRI result with the patient.  Recommended partial 1st ray amputation of right foot.  Explained the benefits and risks of the procedure.  I also explained the risks of waiting to proceed with proposed surgery.  Patient declined amputation but is agreeable to debridement of soft tissue.  Patient would like to follow-up with Dr. Amalia Hailey upon discharge if clinical course will allow.  I discussed case with Dr. Amalia Hailey.  He is agreeable and will see the patient upon discharge.  I will proceed with debridement of right foot tomorrow.  No amputation will be performed.  Benefits and risks of the planned procedure were explained.  NPO after midnight.  Hold anticoagulation.  Hold long-acting insulin.  Timothy Clark IVAN 07/15/2017, 1:11 PM

## 2017-07-15 NOTE — Progress Notes (Addendum)
Initial Nutrition Assessment  DOCUMENTATION CODES:   Obesity unspecified  INTERVENTION:  ProStat 30 ml TID (each 30 ml provides 100 kcal, 15 gr protein)   Recommend CHO modified (soft) diet   NOTE---MVI and Juven  (attempted to order but pt has an associated allergy to calcium carbonate)  NUTRITION DIAGNOSIS:   Increased nutrient needs related to wound healing as evidenced by estimated needs, per patient/family report.  GOAL:   Patient will meet greater than or equal to 90% of their needs   MONITOR:   PO intake, Labs, Skin  REASON FOR ASSESSMENT:   Consult Wound healing  ASSESSMENT: 46 yo who presents with c/o N/V and abdominal pain. Found to have sepsis secondary to cellulitis and osteomyelitis of right foot .  MRI (right foot).  CT of abdomen/pelivs and head obtained and unremarkable.  His home diet is regular. He usually has oatmeal  (bowl to go) at breakfast, lunch is sandwich ham or [place] and chips and dinner is frequently [employer]. Snacks on Peanut butter crackers or chips and drinks primarily water and 1 (20 oz) coke daily.   Talked with him about adding protein to breakfast daily and substituting a more healthy choice for side item with sandwiches.  His weight hx is stable between 133-137 kg.    Recent Labs  Lab 07/14/17 1927 07/15/17 0620  NA 138 136  K 3.4* 3.6  CL 103 101  CO2 25 26  BUN 16 18  CREATININE 1.26* 1.42*  CALCIUM 9.3 8.5*  MG  --  1.3*  GLUCOSE 257* 274*   Labs: glucose 274   CBG (last 3)  Recent Labs    07/15/17 0149 07/15/17 0811 07/15/17 1119  GLUCAP 387* 239* 222*     Meds: levaquin, vancomycin,  reglan, PPI, Miralax, Senna, Neurotin,   SSI and lantus  NUTRITION - FOCUSED PHYSICAL EXAM:    Most Recent Value  Orbital Region  No depletion  Upper Arm Region  No depletion  Thoracic and Lumbar Region  No depletion  Buccal Region  No depletion  Temple Region  No depletion  Clavicle Bone Region  No depletion  Clavicle and  Acromion Bone Region  No depletion  Scapular Bone Region  No depletion  Dorsal Hand  No depletion  Patellar Region  No depletion  Anterior Thigh Region  No depletion  Posterior Calf Region  No depletion  Edema (RD Assessment)  Mild  Hair  Reviewed  Eyes  Reviewed  Mouth  Reviewed  Skin  Reviewed  Nails  Reviewed      Diet Order:  DIET SOFT Room service appropriate? Yes; Fluid consistency: Thin  EDUCATION NEEDS:  Education needs have been addressed(protien needs for wound healing)   Skin:  Skin Assessment: Skin Integrity Issues: Skin Integrity Issues:: Diabetic Ulcer(diabetic wound to lateral right foot)  Last BM:  12/4  Height:   Ht Readings from Last 1 Encounters:  07/15/17 6' (1.829 m)    Weight:   Wt Readings from Last 1 Encounters:  07/15/17 292 lb 1.8 oz (132.5 kg)    Ideal Body Weight:  81 kg  BMI:  Body mass index is 39.62 kg/m.  Estimated Nutritional Needs:   Kcal:  2600-2700 (MSJ x 1.2 AF)  Protein:  140-160 gr  Fluid:  2.6-2.7 liters daily  Colman Cater MS,RD,CSG,LDN Office: [phone] Pager: [phone]

## 2017-07-15 NOTE — Progress Notes (Signed)
Inpatient Diabetes Program Recommendations  AACE/ADA: New Consensus Statement on Inpatient Glycemic Control (2015)  Target Ranges:  Prepandial:   less than 140 mg/dL      Peak postprandial:   less than 180 mg/dL (1-2 hours)      Critically ill patients:  140 - 180 mg/dL   Lab Results  Component Value Date   GLUCAP 239 (H) 07/15/2017   HGBA1C 6.7 (H) 06/10/2017    Review of Glycemic Control  Diabetes history: DM2 Outpatient Diabetes medications: Levemir 20 units QHS, Novolin 70/30 35 units bid Current orders for Inpatient glycemic control: Levemir 20 units QHS, Novolog 0-20 units tidwc + 6 units tidwc  Inpatient Diabetes Program Recommendations:   Increase Levemir to 40 units QHS Increase Novolog to 10 units tidwc for meal coverage insulin Add Novolog HS correction.  Case manager consult from social worker to assist with obtaining meds. Will follow daily.  Thank you. Lorenda Peck, RD, LDN, CDE Inpatient Diabetes Coordinator [phone]

## 2017-07-15 NOTE — Consult Note (Signed)
Podiatry Consult Note  Reason for Consultation:  Cellulitis with possible osteomyelitis, right foot  History of Present Illness: Timothy Clark is a 46 y.o. male admitted after presenting to the ED last night complaining of epigrastric pain and vomiting x 2 days.  He was diagnosed with sepsis due to cellulitis.  Started on aztreonam, levofloxacin and vancomycin.  MRI was ordered.  He has a history of chronic wound of his right foot.  He has been followed by Dr. Amalia Hailey at [place].  There was concern with deep infection and bone biopsy was performed by Dr. Amalia Hailey at an outpatient facility in [place].  He has also been treated at [facility].  HBO treatments were initiated.  Due to availability his care was transferred to [facility].  He was seen at the [facility] yesterday.  A debridement was performed.  A noninvasive arterial study was performed about 2 months ago in [place].   Past Medical History:  Diagnosis Date  . Chronic abdominal pain   . Esophagitis   . Eye hemorrhage   . Gastritis   . Gastroparesis   . GERD (gastroesophageal reflux disease)   . Glaucoma   . Helicobacter pylori (H. pylori) infection 2016   ERADICATION DOCUMENTED VIA STOOL TEST in AUG 2016 at [facility]  . Hiccups   . Hypertension   . Nausea and vomiting    chronic, recurrent  . Type 2 diabetes mellitus with complications (HCC)    diagnosed around age 51   Scheduled Meds: . brimonidine  1 drop Both Eyes BID  . enoxaparin (LOVENOX) injection  70 mg Subcutaneous Q24H  . hydrocerin   Topical BID  . insulin aspart  0-20 Units Subcutaneous TID WC  . insulin detemir  20 Units Subcutaneous QHS  . metoCLOPramide (REGLAN) injection  10 mg Intravenous Q6H  . pantoprazole (PROTONIX) IV  40 mg Intravenous Q24H  . polyethylene glycol  17 g Oral BID   Continuous Infusions: . 0.45 % NaCl with KCl 20 mEq / L 100 mL/hr at 07/15/17 0244  . aztreonam    . vancomycin      PRN Meds:.ondansetron (ZOFRAN) IV  Allergies  Allergen Reactions  . Donnatal [Pb-Hyoscy-Atropine-Scopol Er] Anaphylaxis    Reaction to GI cocktail  . Fish Allergy Anaphylaxis, Hives and Rash  . Haldol [Haloperidol Lactate] Other (See Comments)    Chest Pain  . Lidocaine Anaphylaxis    Reaction to GI cocktail  . Maalox [Calcium Carbonate Antacid] Anaphylaxis    Reaction to GI cocktail  . Aspirin Hives and Itching  . Bactrim [Sulfamethoxazole-Trimethoprim] Itching  . Keflex [Cephalexin] Hives, Itching and Nausea And Vomiting  . Penicillins Hives and Itching    Has patient had a PCN reaction causing immediate rash, facial/tongue/throat swelling, SOB or lightheadedness with hypotension: yes Has patient had a PCN reaction causing severe rash involving mucus membranes or skin necrosis: yes Has patient had a PCN reaction that required hospitalization no Has patient had a PCN reaction occurring within the last 10 years: yes If all of the above answers are "NO", then may proceed with Cephalospor  . Phenergan [Promethazine Hcl] Nausea And Vomiting   Past Surgical History:  Procedure Laterality Date  . CATARACT EXTRACTION W/PHACO Left 05/19/2016   Procedure: CATARACT EXTRACTION PHACO AND INTRAOCULAR LENS PLACEMENT LEFT EYE;  Surgeon: Tonny Branch, MD;  Location: [facility];  Service: Ophthalmology;  Laterality: Left;  CDE: 7.30  . CATARACT EXTRACTION W/PHACO Right  06/23/2016   Procedure: CATARACT EXTRACTION PHACO AND INTRAOCULAR LENS PLACEMENT RIGHT EYE CDE=9.87;  Surgeon: Tonny Branch, MD;  Location: [facility];  Service: Ophthalmology;  Laterality: Right;  right  . ESOPHAGOGASTRODUODENOSCOPY  2015   Dr. Britta Mccreedy  . ESOPHAGOGASTRODUODENOSCOPY N/A 10/26/2014   RMR: Distal esophagititis-likely reflux related although an element of pill induced injuruy no exclueded  status post biopsy. Diffusely abnormal gastric mucosa of uncertain signigicane -status post gastric biopsy. Focal area of excoriation in the  cardia most consistant with a trauma of heaving.   [name] EYE SURGERY  2015   stent placed to left eye  . EYE SURGERY     Family History  Problem Relation Age of Onset  . Ovarian cancer Mother   . Cervical cancer Sister   . Diabetes Sister   . Colon cancer Neg Hx    Social History:  reports that  has never smoked. he has never used smokeless tobacco. He reports that he does not drink alcohol or use drugs.  Review of Systems: Recent history of fever, nausea and emesis.  Chronic ulceration right foot.  Physical Examination: Vital signs in last 24 hours:   Temp:  [98 F (36.7 C)-100.4 F (38 C)] 98 F (36.7 C) (12/05 0517) Pulse Rate:  [89-102] 97 (12/05 0517) Resp:  [13-22] 18 (12/05 0517) BP: (137-208)/(81-121) 156/109 (12/05 0517) SpO2:  [96 %-100 %] 100 % (12/05 0517) Weight:  [292 lb (132.5 kg)-292 lb 1.8 oz (132.5 kg)] 292 lb 1.8 oz (132.5 kg) (12/05 0200)  DRESSING:  Pink foam dressing with adhesive perimeter in place.  No strikethrough. INTEGUMENT:  Skin of both feet is warm and dry with diffuse scaling.  Full thickness ulceration along the dorsomedial aspect of the first metatarsal head of the right foot measuring 1.6 cm x 2.0 cm x 0.9 cm.  Wound bed is comprised of mixed fibrotic and necrotic tissue.  Periwound is macerated.  Moderate exudate on inside of dressing.  Malodor present. VASCULAR:  DP 1/4 bilaterally.  PT nonpalpable.  Edema of both feet and ankles. NEUROLOGIC:  Protective sensation absent bilaterally.  Lab/Test Results:   Recent Labs    07/14/17 1927 07/15/17 0620  WBC 12.2* 8.6  HGB 12.1* 11.0*  HCT 35.9* 32.1*  PLT 180 151  NA 138 136  K 3.4* 3.6  CL 103 101  CO2 25 26  BUN 16 18  CREATININE 1.26* 1.42*  GLUCOSE 257* 274*  CALCIUM 9.3 8.5*    Recent Results (from the past 240 hour(s))  Blood Culture (routine x 2)     Status: None (Preliminary result)   Collection Time: 07/14/17  8:18 PM  Result Value Ref Range Status   Specimen Description  BLOOD RIGHT HAND  Final   Special Requests   Final    BOTTLES DRAWN AEROBIC AND ANAEROBIC Blood Culture results may not be optimal due to an inadequate volume of blood received in culture bottles   Culture NO GROWTH < 12 HOURS  Final   Report Status PENDING  Incomplete  Blood Culture (routine x 2)     Status: None (Preliminary result)   Collection Time: 07/14/17  8:18 PM  Result Value Ref Range Status   Specimen Description BLOOD RIGHT FOREARM  Final   Special Requests   Final    BOTTLES DRAWN AEROBIC AND ANAEROBIC Blood Culture results may not be optimal due to an inadequate volume of blood received in culture bottles   Culture NO GROWTH < 12 HOURS  Final  Report Status PENDING  Incomplete     Ct Abdomen Pelvis Wo Contrast  Result Date: 07/14/2017 CLINICAL DATA:  46 year old male with nausea and vomiting abdominal pain. EXAM: CT ABDOMEN AND PELVIS WITHOUT CONTRAST TECHNIQUE: Multidetector CT imaging of the abdomen and pelvis was performed following the standard protocol without IV contrast. COMPARISON:  Abdominal radiograph dated 07/08/2014 and CT dated 12/22/2016 FINDINGS: Evaluation of this exam is limited in the absence of intravenous contrast. Lower chest: The visualized lung bases are clear. There is no intra-abdominal free air or free fluid. Hepatobiliary: No focal liver abnormality is seen. No gallstones, gallbladder wall thickening, or biliary dilatation. Pancreas: Unremarkable. No pancreatic ductal dilatation or surrounding inflammatory changes. Spleen: Normal in size without focal abnormality. Adrenals/Urinary Tract: The adrenal glands are unremarkable. 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 There is no hydronephrosis or nephrolithiasis on either side. 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 The visualized ureters and urinary bladder appear unremarkable. Stomach/Bowel: There is a moderate colonic stool burden. There is no  bowel obstruction or active inflammation. The appendix is normal. Vascular/Lymphatic: The abdominal aorta and IVC are grossly unremarkable on this noncontrast CT. No portal venous gas. There is no adenopathy. Reproductive: The prostate and seminal vesicles are grossly unremarkable. Other: Small fat containing right inguinal hernia. Musculoskeletal: There is degenerative changes of the spine most prominent at L4-L5 with bridging osteophyte anteriorly. No acute osseous pathology. IMPRESSION: 1. No acute intra-abdominal or pelvic pathology. Moderate colonic stool burden. No bowel obstruction or active inflammation. Normal appendix. 2. No hydronephrosis or nephrolithiasis. Electronically Signed   By: Anner Crete M.D.   On: 07/14/2017 22:24   Ct Head Wo Contrast  Result Date: 07/14/2017 CLINICAL DATA:  Chest pain, shortness of breath, anxiety, abdominal pain, vision changes. EXAM: CT HEAD WITHOUT CONTRAST TECHNIQUE: Contiguous axial images were obtained from the base of the skull through the vertex without intravenous contrast. COMPARISON:  CT HEAD in January 26, 2017 FINDINGS: BRAIN: No intraparenchymal hemorrhage, mass effect nor midline shift. Mild parenchymal brain volume loss for age. No hydrocephalus . No acute large vascular territory infarcts. No abnormal extra-axial fluid collections. Basal cisterns are patent. VASCULAR: Trace calcific atherosclerosis carotid siphons. SKULL/SOFT TISSUES: No skull fracture. No significant soft tissue swelling. Similar RIGHT parietal scalp scarring. ORBITS/SINUSES: Status post LEFT ocular lens implant with similar probable reservoir, possible glaucoma drainage device. Small bilateral mastoid effusions without air cell coalescence. Paranasal sinuses are well-aerated. OTHER: None. IMPRESSION: 1. No acute intracranial process. 2. Mild parenchymal brain volume loss for age. Electronically Signed   By: Elon Alas M.D.   On: 07/14/2017 22:25   Dg Foot Complete  Right  Result Date: 07/15/2017 CLINICAL DATA:  46 y/o M; laceration to medial aspect of right foot. EXAM: RIGHT FOOT COMPLETE - 3+ VIEW COMPARISON:  02/25/2017 right foot radiographs. FINDINGS: Bony defect of medial aspect of the first metatarsal head with a bone displaced medially in the soft tissues as well as overlying soft tissue irregularity. Joint spaces are well maintained. Vascular calcifications noted. Small dorsal calcaneal enthesophyte. IMPRESSION: Bony defect of medial aspect of first metatarsal head with a bone displaced medially into soft tissues. In the setting of trauma this may represent a soft tissue and bone laceration. Destructive changes of osteomyelitis are possible in the setting of infection. Electronically Signed   By: Kristine Garbe M.D.   On: 07/15/2017 00:26    Assessment: 1.  Sepsis with cellulitis of the right  foot. 2.  Ulceration of right foot with necrosis. 3.  Concern for osteomyelitis. 4.  Diabetes mellitus with peripheral neuropathy.  Plan: Appreciate Wound Care input and agree with recommended care for now.  Medical records requested from [facility], [facility] (including noninvasive arterial studies), [place] [facility] and [place].  Radiographs reveals what appears to be post-surgical changes along the medial aspect of the first metatarsal head.  This likely corresponds to the biopsy the patient described.  MRI pending.  Continue IV antibiotics per hospitalist service.  Will likely need debridement of soft tissue and/or bone.  Awaiting vascular study results from [facility].  Will follow during admission.  Timothy Clark 07/15/2017, 8:47 AM

## 2017-07-16 ENCOUNTER — Inpatient Hospital Stay (HOSPITAL_COMMUNITY): Payer: Medicare Other | Admitting: Anesthesiology

## 2017-07-16 ENCOUNTER — Encounter (HOSPITAL_COMMUNITY): Payer: Self-pay | Admitting: Anesthesiology

## 2017-07-16 ENCOUNTER — Encounter (HOSPITAL_COMMUNITY)
Admission: EM | Disposition: A | Discharge status: Home Health | Payer: Self-pay | Source: Home / Self Care | Attending: Family Medicine

## 2017-07-16 DIAGNOSIS — M869 Osteomyelitis, unspecified: Secondary | ICD-10-CM | POA: Diagnosis not present

## 2017-07-16 DIAGNOSIS — E782 Mixed hyperlipidemia: Secondary | ICD-10-CM | POA: Diagnosis not present

## 2017-07-16 DIAGNOSIS — I10 Essential (primary) hypertension: Secondary | ICD-10-CM | POA: Diagnosis not present

## 2017-07-16 DIAGNOSIS — E1143 Type 2 diabetes mellitus with diabetic autonomic (poly)neuropathy: Secondary | ICD-10-CM | POA: Diagnosis not present

## 2017-07-16 DIAGNOSIS — E785 Hyperlipidemia, unspecified: Secondary | ICD-10-CM | POA: Diagnosis not present

## 2017-07-16 DIAGNOSIS — F419 Anxiety disorder, unspecified: Secondary | ICD-10-CM | POA: Diagnosis not present

## 2017-07-16 DIAGNOSIS — G6289 Other specified polyneuropathies: Secondary | ICD-10-CM | POA: Diagnosis not present

## 2017-07-16 DIAGNOSIS — K3184 Gastroparesis: Secondary | ICD-10-CM | POA: Diagnosis not present

## 2017-07-16 HISTORY — PX: INCISION AND DRAINAGE OF WOUND: SHX1803

## 2017-07-16 LAB — GLUCOSE, CAPILLARY
Glucose-Capillary: 225 mg/dL — ABNORMAL HIGH (ref 65–99)
Glucose-Capillary: 182 mg/dL — ABNORMAL HIGH (ref 65–99)
Glucose-Capillary: 247 mg/dL — ABNORMAL HIGH (ref 65–99)
Glucose-Capillary: 211 mg/dL — ABNORMAL HIGH (ref 65–99)

## 2017-07-16 LAB — BASIC METABOLIC PANEL
Anion gap: 8 (ref 5–15)
BUN: 24 mg/dL — ABNORMAL HIGH (ref 6–20)
CO2: 24 mmol/L (ref 22–32)
Calcium: 8.3 mg/dL — ABNORMAL LOW (ref 8.9–10.3)
Chloride: 104 mmol/L (ref 101–111)
Creatinine, Ser: 1.68 mg/dL — ABNORMAL HIGH (ref 0.61–1.24)
GFR calc Af Amer: 54 mL/min — ABNORMAL LOW (ref >60)
GFR calc non Af Amer: 47 mL/min — ABNORMAL LOW (ref >60)
Glucose, Bld: 267 mg/dL — ABNORMAL HIGH (ref 65–99)
Potassium: 3.8 mmol/L (ref 3.5–5.1)
Sodium: 136 mmol/L (ref 135–145)

## 2017-07-16 LAB — CBC WITH DIFFERENTIAL/PLATELET
Basophils Absolute: 0 10*3/uL (ref 0.0–0.1)
Basophils Relative: 0 %
Eosinophils Absolute: 0 10*3/uL (ref 0.0–0.7)
Eosinophils Relative: 0 %
HCT: 33.4 % — ABNORMAL LOW (ref 39.0–52.0)
Hemoglobin: 11.3 g/dL — ABNORMAL LOW (ref 13.0–17.0)
Lymphocytes Relative: 12 %
Lymphs Abs: 1.2 10*3/uL (ref 0.7–4.0)
MCH: 31 pg (ref 26.0–34.0)
MCHC: 33.8 g/dL (ref 30.0–36.0)
MCV: 91.8 fL (ref 78.0–100.0)
Monocytes Absolute: 0.6 10*3/uL (ref 0.1–1.0)
Monocytes Relative: 6 %
Neutro Abs: 7.9 10*3/uL — ABNORMAL HIGH (ref 1.7–7.7)
Neutrophils Relative %: 82 %
Platelets: 149 10*3/uL — ABNORMAL LOW (ref 150–400)
RBC: 3.64 MIL/uL — ABNORMAL LOW (ref 4.22–5.81)
RDW: 12.9 % (ref 11.5–15.5)
WBC: 9.7 10*3/uL (ref 4.0–10.5)

## 2017-07-16 LAB — HIV ANTIBODY (ROUTINE TESTING W REFLEX): HIV Screen 4th Generation wRfx: NONREACTIVE

## 2017-07-16 SURGERY — IRRIGATION AND DEBRIDEMENT WOUND
Anesthesia: Monitor Anesthesia Care | Site: Foot | Laterality: Right

## 2017-07-16 MED ORDER — LABETALOL HCL 5 MG/ML IV SOLN
INTRAVENOUS | Status: AC
  Filled 2017-07-16: qty 4

## 2017-07-16 MED ORDER — HYDROMORPHONE HCL 1 MG/ML IJ SOLN
0.5000 mg | Freq: Once | INTRAMUSCULAR | Status: AC
  Administered 2017-07-16: 0.5 mg via INTRAVENOUS

## 2017-07-16 MED ORDER — LORAZEPAM 2 MG/ML IJ SOLN
1.0000 mg | INTRAMUSCULAR | Status: DC | PRN
  Administered 2017-07-16 – 2017-07-18 (×7): 1 mg via INTRAVENOUS
  Filled 2017-07-16 (×7): qty 1

## 2017-07-16 MED ORDER — 0.9 % SODIUM CHLORIDE (POUR BTL) OPTIME
TOPICAL | Status: DC | PRN
  Administered 2017-07-16: 1000 mL

## 2017-07-16 MED ORDER — ONDANSETRON 4 MG PO TBDP
ORAL_TABLET | ORAL | Status: AC
  Filled 2017-07-16: qty 1

## 2017-07-16 MED ORDER — PANTOPRAZOLE SODIUM 40 MG IV SOLR
40.0000 mg | Freq: Once | INTRAVENOUS | Status: AC
  Filled 2017-07-16: qty 40

## 2017-07-16 MED ORDER — DICYCLOMINE HCL 10 MG PO CAPS
20.0000 mg | ORAL_CAPSULE | Freq: Three times a day (TID) | ORAL | Status: DC
  Administered 2017-07-16 – 2017-07-18 (×8): 20 mg via ORAL
  Filled 2017-07-16 (×8): qty 2

## 2017-07-16 MED ORDER — DICYCLOMINE HCL 10 MG PO CAPS
20.0000 mg | ORAL_CAPSULE | Freq: Once | ORAL | Status: AC
  Administered 2017-07-16: 20 mg via ORAL
  Filled 2017-07-16: qty 2

## 2017-07-16 MED ORDER — SODIUM CHLORIDE 0.9% FLUSH
INTRAVENOUS | Status: AC
  Filled 2017-07-16: qty 10

## 2017-07-16 MED ORDER — BUPIVACAINE HCL (PF) 0.5 % IJ SOLN
INTRAMUSCULAR | Status: AC
  Filled 2017-07-16: qty 30

## 2017-07-16 MED ORDER — SODIUM CHLORIDE 0.9 % IR SOLN
Status: DC | PRN
  Administered 2017-07-16: 3000 mL

## 2017-07-16 MED ORDER — MIDAZOLAM HCL 2 MG/2ML IJ SOLN
INTRAMUSCULAR | Status: AC
  Filled 2017-07-16: qty 2

## 2017-07-16 MED ORDER — ONDANSETRON 4 MG PO TBDP
4.0000 mg | ORAL_TABLET | Freq: Once | ORAL | Status: AC
  Administered 2017-07-16: 4 mg via ORAL

## 2017-07-16 MED ORDER — PROPOFOL 500 MG/50ML IV EMUL
INTRAVENOUS | Status: DC | PRN
  Administered 2017-07-16: 100 ug/kg/min via INTRAVENOUS
  Administered 2017-07-16: 11:00:00 via INTRAVENOUS

## 2017-07-16 MED ORDER — LORAZEPAM 2 MG/ML IJ SOLN
0.5000 mg | Freq: Once | INTRAMUSCULAR | Status: AC
  Administered 2017-07-16: 0.5 mg via INTRAVENOUS

## 2017-07-16 MED ORDER — BISACODYL 10 MG RE SUPP
10.0000 mg | Freq: Once | RECTAL | Status: AC
  Administered 2017-07-16: 10 mg via RECTAL
  Filled 2017-07-16: qty 1

## 2017-07-16 MED ORDER — LABETALOL HCL 5 MG/ML IV SOLN
5.0000 mg | Freq: Once | INTRAVENOUS | Status: AC
  Administered 2017-07-16: 5 mg via INTRAVENOUS

## 2017-07-16 MED ORDER — MIDAZOLAM HCL 5 MG/5ML IJ SOLN
INTRAMUSCULAR | Status: DC | PRN
  Administered 2017-07-16: 1 mg via INTRAVENOUS

## 2017-07-16 MED ORDER — PROPOFOL 10 MG/ML IV BOLUS
INTRAVENOUS | Status: AC
Start: 2017-07-16 — End: ?
  Filled 2017-07-16: qty 20

## 2017-07-16 MED ORDER — LIDOCAINE HCL (PF) 1 % IJ SOLN
INTRAMUSCULAR | Status: AC
  Filled 2017-07-16: qty 30

## 2017-07-16 MED ORDER — SEVOFLURANE IN SOLN
RESPIRATORY_TRACT | Status: AC
  Filled 2017-07-16: qty 250

## 2017-07-16 MED ORDER — METOCLOPRAMIDE HCL 10 MG PO TABS: 10.0000 mg | ORAL_TABLET | Freq: Four times a day (QID) | ORAL | Status: DC | PRN

## 2017-07-16 MED ORDER — PROMETHAZINE HCL 25 MG/ML IJ SOLN
6.2500 mg | Freq: Once | INTRAMUSCULAR | Status: AC
  Administered 2017-07-16: 6.25 mg via INTRAVENOUS

## 2017-07-16 MED ORDER — HYDRALAZINE HCL 20 MG/ML IJ SOLN
10.0000 mg | Freq: Once | INTRAMUSCULAR | Status: AC
  Administered 2017-07-16: 10 mg via INTRAVENOUS

## 2017-07-16 MED ORDER — LABETALOL HCL 5 MG/ML IV SOLN
10.0000 mg | INTRAVENOUS | Status: DC | PRN
Start: 2017-07-16 — End: 2017-07-18
  Administered 2017-07-16: 10 mg via INTRAVENOUS
  Filled 2017-07-16: qty 4

## 2017-07-16 MED ORDER — DICYCLOMINE HCL 20 MG PO TABS
20.0000 mg | ORAL_TABLET | Freq: Three times a day (TID) | ORAL | Status: DC
  Filled 2017-07-16 (×4): qty 1

## 2017-07-16 MED ORDER — FENTANYL CITRATE (PF) 100 MCG/2ML IJ SOLN
INTRAMUSCULAR | Status: DC | PRN
  Administered 2017-07-16: 25 ug via INTRAVENOUS

## 2017-07-16 MED ORDER — HYDRALAZINE HCL 20 MG/ML IJ SOLN
INTRAMUSCULAR | Status: AC
  Filled 2017-07-16: qty 1

## 2017-07-16 MED ORDER — HYDROMORPHONE HCL 1 MG/ML IJ SOLN
INTRAMUSCULAR | Status: AC
  Filled 2017-07-16: qty 1

## 2017-07-16 MED ORDER — SODIUM CHLORIDE 0.9 % IV SOLN
INTRAVENOUS | Status: DC
  Administered 2017-07-16: 10:00:00 via INTRAVENOUS

## 2017-07-16 MED ORDER — FENTANYL CITRATE (PF) 100 MCG/2ML IJ SOLN
INTRAMUSCULAR | Status: AC
  Filled 2017-07-16: qty 2

## 2017-07-16 MED ORDER — PROMETHAZINE HCL 25 MG/ML IJ SOLN
INTRAMUSCULAR | Status: AC
  Filled 2017-07-16: qty 1

## 2017-07-16 MED ORDER — SENNA 8.6 MG PO TABS
2.0000 | ORAL_TABLET | Freq: Two times a day (BID) | ORAL | Status: DC
  Administered 2017-07-16 – 2017-07-18 (×3): 17.2 mg via ORAL
  Filled 2017-07-16 (×4): qty 2

## 2017-07-16 MED ORDER — HYDRALAZINE HCL 20 MG/ML IJ SOLN
15.0000 mg | INTRAMUSCULAR | Status: DC | PRN
  Administered 2017-07-17: 15 mg via INTRAVENOUS
  Filled 2017-07-16: qty 1

## 2017-07-16 MED ORDER — SUCRALFATE 1 G PO TABS
1.0000 g | ORAL_TABLET | Freq: Four times a day (QID) | ORAL | Status: DC
  Administered 2017-07-16 – 2017-07-18 (×8): 1 g via ORAL
  Filled 2017-07-16 (×9): qty 1

## 2017-07-16 MED ORDER — MIDAZOLAM HCL 2 MG/2ML IJ SOLN
1.0000 mg | Freq: Once | INTRAMUSCULAR | Status: AC | PRN
  Administered 2017-07-16: 2 mg via INTRAVENOUS

## 2017-07-16 MED ORDER — LORAZEPAM 2 MG/ML IJ SOLN
0.5000 mg | Freq: Once | INTRAMUSCULAR | Status: AC
  Administered 2017-07-16: 0.5 mg via INTRAVENOUS
  Filled 2017-07-16: qty 1

## 2017-07-16 MED ORDER — SODIUM CHLORIDE 0.9% FLUSH
INTRAVENOUS | Status: AC
Start: 2017-07-16 — End: ?
  Filled 2017-07-16: qty 10

## 2017-07-16 MED ORDER — GABAPENTIN 300 MG PO CAPS
300.0000 mg | ORAL_CAPSULE | Freq: Three times a day (TID) | ORAL | Status: DC | PRN
  Administered 2017-07-17: 300 mg via ORAL
  Filled 2017-07-16: qty 1

## 2017-07-16 SURGICAL SUPPLY — 33 items
BAG HAMPER (MISCELLANEOUS) ×2 IMPLANT
BANDAGE ELASTIC 4 LF NS (GAUZE/BANDAGES/DRESSINGS) ×2 IMPLANT
BLADE SURG 15 STRL LF DISP TIS (BLADE) ×2 IMPLANT
BLADE SURG 15 STRL SS (BLADE) ×2
BNDG GAUZE ELAST 4 BULKY (GAUZE/BANDAGES/DRESSINGS) ×2 IMPLANT
CLOTH BEACON ORANGE TIMEOUT ST (SAFETY) ×2 IMPLANT
COVER LIGHT HANDLE STERIS (MISCELLANEOUS) ×4 IMPLANT
CUFF TOURNIQUET SINGLE 18IN (TOURNIQUET CUFF) IMPLANT
ELECT REM PT RETURN 9FT ADLT (ELECTROSURGICAL) ×2
ELECTRODE REM PT RTRN 9FT ADLT (ELECTROSURGICAL) ×1 IMPLANT
GAUZE IODOFORM PACK 1/2 7832 (GAUZE/BANDAGES/DRESSINGS) ×2 IMPLANT
GAUZE SPONGE 4X4 12PLY STRL (GAUZE/BANDAGES/DRESSINGS) ×2 IMPLANT
GLOVE BIO SURGEON STRL SZ7.5 (GLOVE) ×2 IMPLANT
GLOVE BIOGEL PI IND STRL 7.0 (GLOVE) ×2 IMPLANT
GLOVE BIOGEL PI INDICATOR 7.0 (GLOVE) ×2
GLOVE ECLIPSE 6.5 STRL STRAW (GLOVE) ×2 IMPLANT
GLOVE ECLIPSE 7.0 STRL STRAW (GLOVE) ×2 IMPLANT
GOWN STRL REUS W/TWL LRG LVL3 (GOWN DISPOSABLE) ×4 IMPLANT
HANDPIECE INTERPULSE COAX TIP (DISPOSABLE) ×2
IV NS IRRIG 3000ML ARTHROMATIC (IV SOLUTION) ×2 IMPLANT
KIT ROOM TURNOVER AP CYSTO (KITS) ×2 IMPLANT
MANIFOLD NEPTUNE II (INSTRUMENTS) ×2 IMPLANT
NEEDLE HYPO 27GX1-1/4 (NEEDLE) ×4 IMPLANT
NS IRRIG 1000ML POUR BTL (IV SOLUTION) ×2 IMPLANT
PACK BASIC LIMB (CUSTOM PROCEDURE TRAY) ×2 IMPLANT
PAD ARMBOARD 7.5X6 YLW CONV (MISCELLANEOUS) ×2 IMPLANT
SET BASIN LINEN APH (SET/KITS/TRAYS/PACK) ×2 IMPLANT
SET HNDPC FAN SPRY TIP SCT (DISPOSABLE) ×2 IMPLANT
SPONGE LAP 18X18 X RAY DECT (DISPOSABLE) ×2 IMPLANT
SUT VIC AB 2-0 CT2 27 (SUTURE) ×2 IMPLANT
SUT VIC AB 4-0 PS2 27 (SUTURE) ×2 IMPLANT
SUT VICRYL AB 3-0 FS1 BRD 27IN (SUTURE) ×2 IMPLANT
SYR CONTROL 10ML LL (SYRINGE) ×4 IMPLANT

## 2017-07-16 NOTE — Transfer of Care (Signed)
Immediate Anesthesia Transfer of Care Note  Patient: Timothy Clark  Procedure(s) Performed: IRRIGATION AND DEBRIDEMENT OF SOFT TISSUE OF ULCERATION RIGHT FOOT (Right Foot)  Patient Location: PACU  Anesthesia Type:MAC  Level of Consciousness: awake and patient cooperative  Airway & Oxygen Therapy: Patient Spontanous Breathing and Patient connected to face mask oxygen  Post-op Assessment: Report given to RN, Post -op Vital signs reviewed and stable and Patient moving all extremities  Post vital signs: Reviewed and stable  Last Vitals:  Vitals:   07/16/17 1000 07/16/17 1005  BP: (!) 163/93   Pulse:    Resp: 14 15  Temp:    SpO2: 96% 97%    Last Pain:  Vitals:   07/16/17 0900  TempSrc: Oral  PainSc:          Complications: No apparent anesthesia complications

## 2017-07-16 NOTE — Progress Notes (Signed)
Pt attempting to ambulate back to bed from bathroom after having BM. Two NT's assisting patient with ambulation. Pt assisted to seated position in floor. Pt states "my leg went stiff and it gave out on me". Pt's VSS. Noted abrasion to patient's right flank. Foam dressing applied. Pt was lifted from floor to bed with Maximove lift. Pt tolerated well. Pt verbalizes no pain. Dr. Wynetta Emery paged and made aware.

## 2017-07-16 NOTE — Progress Notes (Signed)
PROGRESS NOTE   GURKARAN RAHM  IWP:[id number]  DOB: 25-Jul-1971  DOA: 07/14/2017 PCP: Tawni Carnes, PA-C  Brief Admission Hx: HARTWELL VANDIVER is a 46 y.o. male with medical history significant of chronic abdominal pain, esophagitis, gastritis, gastroparesis, GERD, history of H. pylori infection, hiccups, recurring nausea and vomiting, hemorrhage, glaucoma, hypertension, hyperlipidemia, type 2 diabetes who is coming to the emergency department with complaints of abdominal pain, burning chest pain, nausea and 10 or more episodes of emesis since this morning.  He was admitted for sepsis, cellulitis and constipation.   MDM/Assessment & Plan:   1. Sepsis secondary to cellulitis and osteomyelitis of right foot - MRI and plain film imaging of right foot highly suspicious for osteomyelitis.  Continue IV antibiotics, obtain podiatry consultation.  I spoke with podiatrist and he is recommending I&D which is scheduled for today.   2. 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  A1c 6.7%.   3. Insensate feet - Pt is at very HIGH RISK for amputation.  4. Dyslipidemia - likely secondary to poorly controlled diabetes mellitus.  Resume home atorvastatin.  5. Glaucoma - resume home eye drops.  6. Diabetic gastroparesis - IVFs, IV nausea meds and IV metoclopromide ordered.  7. Hypokalemia - repleting.  8. GERD - IV protonix ordered.  9. Moderate constipation - likely causing most of symptoms, laxative ordered.  10. Anemia unspecified - monitor Hg closely.  Repeat CBC in AM.    DVT prophylaxis: SCDs Code Status: Full  Family Communication: none present Disposition Plan: TBD   Consultants:  podiatry  Subjective: Pt reports that he was nauseated after drinking the protein supplement yesterday.   Objective: Vitals:   07/16/17 0950 07/16/17 0955 07/16/17 1000 07/16/17 1005  BP: (!) 156/94 (!) 161/92 (!) 163/93    Pulse:      Resp: 14 13 14 15   Temp:      TempSrc:      SpO2: 97% 97% 96% 97%  Weight:      Height:        Intake/Output Summary (Last 24 hours) at 07/16/2017 1010 Last data filed at 07/16/2017 0900 Gross per 24 hour  Intake 2202 ml  Output 2675 ml  Net -473 ml   Filed Weights   07/14/17 1855 07/15/17 0200  Weight: 132.5 kg (292 lb) 132.5 kg (292 lb 1.8 oz)   REVIEW OF SYSTEMS  As per history otherwise all reviewed and reported negative  Exam:  General exam: awake, alert, NAD. Strabismus noted.  Dry MM. Respiratory system: Clear. No increased work of breathing. Cardiovascular system: S1 & S2 heard, RRR. No JVD, murmurs, gallops, clicks or pedal edema. Gastrointestinal system: Abdomen is nondistended, soft and nontender. Normal bowel sounds heard. Central nervous system: Alert and oriented. No focal neurological deficits. Extremities: positive right foot medial aspect ulcer with seropurulent discharge  Data Reviewed: Basic Metabolic Panel: Recent Labs  Lab 07/14/17 1927 07/15/17 0620 07/16/17 0526  NA 138 136 136  K 3.4* 3.6 3.8  CL 103 101 104  CO2 25 26 24   GLUCOSE 257* 274* 267*  BUN 16 18 24*  CREATININE 1.26* 1.42* 1.68*  CALCIUM 9.3 8.5* 8.3*  MG  --  1.3*  --    Liver Function Tests: Recent Labs  Lab 07/14/17 1927  AST 21  ALT 18  ALKPHOS 66  BILITOT 0.7  PROT 7.5  ALBUMIN 3.6   Recent Labs  Lab 07/14/17 1927  LIPASE 39   No results for input(s): AMMONIA in the last 168 hours. CBC: Recent Labs  Lab 07/14/17 1927 07/15/17 0620 07/16/17 0526  WBC 12.2* 8.6 9.7  NEUTROABS  --  6.3 7.9*  HGB 12.1* 11.0* 11.3*  HCT 35.9* 32.1* 33.4*  MCV 90.2 90.7 91.8  PLT 180 151 149*   Cardiac Enzymes: No results for input(s): CKTOTAL, CKMB, CKMBINDEX, TROPONINI in the last 168 hours. CBG (last 3)  Recent Labs    07/15/17 1625 07/15/17 2039 07/16/17 0806  GLUCAP 88 136* 225*   Recent Results (from the past 240 hour(s))  Blood Culture  (routine x 2)     Status: None (Preliminary result)   Collection Time: 07/14/17  8:18 PM  Result Value Ref Range Status   Specimen Description BLOOD RIGHT HAND  Final   Special Requests   Final    BOTTLES DRAWN AEROBIC AND ANAEROBIC Blood Culture results may not be optimal due to an inadequate volume of blood received in culture bottles   Culture NO GROWTH 2 DAYS  Final   Report Status PENDING  Incomplete  Blood Culture (routine x 2)     Status: None (Preliminary result)   Collection Time: 07/14/17  8:18 PM  Result Value Ref Range Status   Specimen Description BLOOD RIGHT FOREARM  Final   Special Requests   Final    BOTTLES DRAWN AEROBIC AND ANAEROBIC Blood Culture results may not be optimal due to an inadequate volume of blood received in culture bottles   Culture NO GROWTH 2 DAYS  Final   Report Status PENDING  Incomplete  Surgical pcr screen     Status: None   Collection Time: 07/15/17  2:35 PM  Result Value Ref Range Status   MRSA, PCR NEGATIVE NEGATIVE Final   Staphylococcus aureus NEGATIVE NEGATIVE Final    Comment: (NOTE) The Xpert SA Assay (FDA approved for NASAL specimens in patients 42 years of age and older), is one component of a comprehensive surveillance program. It is not intended to diagnose infection nor to guide or monitor treatment.      Studies: Ct Abdomen Pelvis Wo Contrast  Result Date: 07/14/2017 CLINICAL DATA:  46 year old male with nausea and vomiting abdominal pain. EXAM: CT ABDOMEN AND PELVIS WITHOUT CONTRAST TECHNIQUE: Multidetector CT imaging of the abdomen and pelvis was performed following the standard protocol without IV contrast. COMPARISON:  Abdominal radiograph dated 07/08/2014 and CT dated 12/22/2016 FINDINGS: Evaluation of this exam is limited in the absence of intravenous contrast. Lower chest: The visualized lung bases are clear. There is no intra-abdominal free air or free fluid. Hepatobiliary: No focal liver abnormality is seen. No gallstones,  gallbladder wall thickening, or biliary dilatation. Pancreas: Unremarkable. No pancreatic ductal dilatation or surrounding inflammatory changes. Spleen: Normal in size without focal abnormality. Adrenals/Urinary Tract: The adrenal glands are unremarkable. 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 There is no hydronephrosis or nephrolithiasis on either side. 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 The visualized ureters and urinary bladder appear unremarkable. Stomach/Bowel: There is a moderate colonic stool burden. There is no bowel obstruction or active inflammation. The appendix is normal. Vascular/Lymphatic: The abdominal aorta and IVC are grossly unremarkable on this noncontrast CT. No portal venous gas. There is no adenopathy. Reproductive: The prostate and seminal vesicles are grossly unremarkable. Other: Small fat containing right inguinal hernia. Musculoskeletal: There is degenerative changes of the spine most prominent at L4-L5 with bridging osteophyte anteriorly. No  acute osseous pathology. IMPRESSION: 1. No acute intra-abdominal or pelvic pathology. Moderate colonic stool burden. No bowel obstruction or active inflammation. Normal appendix. 2. No hydronephrosis or nephrolithiasis. Electronically Signed   By: Anner Crete M.D.   On: 07/14/2017 22:24   Ct Head Wo Contrast  Result Date: 07/14/2017 CLINICAL DATA:  Chest pain, shortness of breath, anxiety, abdominal pain, vision changes. EXAM: CT HEAD WITHOUT CONTRAST TECHNIQUE: Contiguous axial images were obtained from the base of the skull through the vertex without intravenous contrast. COMPARISON:  CT HEAD in January 26, 2017 FINDINGS: BRAIN: No intraparenchymal hemorrhage, mass effect nor midline shift. Mild parenchymal brain volume loss for age. No hydrocephalus . No acute large vascular territory infarcts. No abnormal extra-axial fluid collections. Basal cisterns are patent. VASCULAR:  Trace calcific atherosclerosis carotid siphons. SKULL/SOFT TISSUES: No skull fracture. No significant soft tissue swelling. Similar RIGHT parietal scalp scarring. ORBITS/SINUSES: Status post LEFT ocular lens implant with similar probable reservoir, possible glaucoma drainage device. Small bilateral mastoid effusions without air cell coalescence. Paranasal sinuses are well-aerated. OTHER: None. IMPRESSION: 1. No acute intracranial process. 2. Mild parenchymal brain volume loss for age. Electronically Signed   By: Elon Alas M.D.   On: 07/14/2017 22:25   Mr Foot Right Wo Contrast  Result Date: 07/15/2017 CLINICAL DATA:  Diabetic patient with a chronic, nonhealing wound along the distal first metatarsal. Osteomyelitis in the distal first metatarsal on prior MRI. EXAM: MRI OF THE RIGHT FOREFOOT WITHOUT CONTRAST TECHNIQUE: Multiplanar, multisequence MR imaging of the right forefoot was performed. No intravenous contrast was administered. COMPARISON:  MRI right foot 03/19/2017. Plain films right foot 11/18/2016 and 07/14/2017. FINDINGS: This examination was ordered with and without contrast but contrast could not be given due to the patient's renal insufficiency. Bones/Joint/Cartilage There is marrow edema with corresponding decreased T1 signal in approximately the distal 4 cm of the first metatarsal, throughout the proximal phalanx of the great toe and in both the medial and lateral sesamoids of the first MTP joint. Bony destructive change along the lateral cortex of the distal first metatarsal is identified. There is a first MTP joint effusion. Ligaments Intact. Muscles and Tendons There is edema throughout intrinsic musculature of the foot. No intramuscular fluid collection is identified. Soft tissues A large skin ulceration is seen along the medial margin of the first metatarsal head. Fluid collection in the first intermetatarsal space measures 1.3 cm craniocaudal by 0.5 cm transverse by 1 cm long. No other  focal fluid collection is identified. IMPRESSION: Markedly worsened appearance of the patient's right foot since the comparison MRI. Abnormal marrow signal in the distal 4 cm of the first metatarsal, throughout the proximal phalanx of the great toe and in both the medial and lateral sesamoids of the first MTP joint is consistent with osteomyelitis. First MTP joint effusion is likely due to septic joint. Intense subcutaneous edema over the foot consistent with cellulitis. Fluid in the first intermetatarsal space is consistent with bursitis, possibly septic. Large skin ulceration adjacent to the first metatarsal head is noted. Electronically Signed   By: Inge Rise M.D.   On: 07/15/2017 09:56   US Arterial Abi (screening Lower Extremity)  Result Date: 07/15/2017 CLINICAL DATA:  Diabetic foot wound, poor healing EXAM: NONINVASIVE PHYSIOLOGIC VASCULAR STUDY OF BILATERAL LOWER EXTREMITIES TECHNIQUE: Evaluation of both lower extremities were performed at rest, including calculation of ankle-brachial indices with single level Doppler, pressure and pulse volume recording. COMPARISON:  12/06/2016 FINDINGS: Right ABI:  Not obtainable, vessels are noncompressible  Left ABI:  Not obtainable, vessels are noncompressible Right Lower Extremity:  Normal arterial waveforms at the ankle. Left Lower Extremity:  Normal arterial waveforms at the ankle. Accurate ABIs cannot be obtained secondary to noncompressible vessels at the ankle. IMPRESSION: ABIs cannot be obtained secondary to vessel noncompressibility No significant waveform abnormality involving the posterior tibial and dorsalis pedis arteries bilaterally. Electronically Signed   By: Jerilynn Mages.  Shick M.D.   On: 07/15/2017 12:44   Dg Foot Complete Right  Result Date: 07/15/2017 CLINICAL DATA:  46 y/o M; laceration to medial aspect of right foot. EXAM: RIGHT FOOT COMPLETE - 3+ VIEW COMPARISON:  02/25/2017 right foot radiographs. FINDINGS: Bony defect of medial aspect of the  first metatarsal head with a bone displaced medially in the soft tissues as well as overlying soft tissue irregularity. Joint spaces are well maintained. Vascular calcifications noted. Small dorsal calcaneal enthesophyte. IMPRESSION: Bony defect of medial aspect of first metatarsal head with a bone displaced medially into soft tissues. In the setting of trauma this may represent a soft tissue and bone laceration. Destructive changes of osteomyelitis are possible in the setting of infection. Electronically Signed   By: Kristine Garbe M.D.   On: 07/15/2017 00:26   Scheduled Meds: . [MAR Hold] amLODipine  5 mg Oral Daily  . [MAR Hold] atorvastatin  10 mg Oral Daily  . [MAR Hold] brimonidine  1 drop Both Eyes BID  . [MAR Hold] enoxaparin (LOVENOX) injection  70 mg Subcutaneous Q24H  . [MAR Hold] hydrocerin   Topical BID  . [MAR Hold] insulin aspart  0-20 Units Subcutaneous TID WC  . [MAR Hold] insulin aspart  8 Units Subcutaneous TID WC  . [MAR Hold] insulin glargine  25 Units Subcutaneous QHS  . [MAR Hold] metoprolol succinate  50 mg Oral Daily  . [MAR Hold] pantoprazole  40 mg Oral BID AC  . [MAR Hold] polyethylene glycol  17 g Oral BID  . [MAR Hold] senna  1 tablet Oral BID  . [MAR Hold] sertraline  100 mg Oral Daily  . [MAR Hold] sucralfate  1 g Oral QID  . [MAR Hold] timolol  1 drop Both Eyes BID   Continuous Infusions: . sodium chloride 10 mL/hr at 07/16/17 1001  . 0.9 % NaCl with KCl 20 mEq / L Stopped (07/16/17 0845)  . [MAR Hold] aztreonam 2 g (07/16/17 0900)  . [MAR Hold] levofloxacin (LEVAQUIN) IV Stopped (07/15/17 2130)  . [MAR Hold] vancomycin Stopped (07/15/17 2239)    Principal Problem:   Sepsis due to cellulitis [facility]) Active Problems:   Diabetes mellitus type 2 with complications, uncontrolled ([place])   Hyperlipidemia   Depression   Essential hypertension   Gastroparesis due to DM ([facility])   Hypokalemia   GERD (gastroesophageal reflux disease)   Glaucoma    Anemia   Diabetic foot infection ([place])  Time spent:   Irwin Brakeman, MD, FAAFP [facility] Pager [phone]  If 7PM-7AM, please contact night-coverage [email or web address] Password [email or web address] 07/16/2017, 10:10 AM    LOS: 2 days

## 2017-07-16 NOTE — Anesthesia Postprocedure Evaluation (Signed)
Anesthesia Post Note  Patient: Timothy Clark  Procedure(s) Performed: IRRIGATION AND DEBRIDEMENT OF SOFT TISSUE OF ULCERATION RIGHT FOOT (Right Foot)  Patient location during evaluation: PACU Anesthesia Type: MAC Level of consciousness: awake and patient cooperative Pain management: pain level controlled Vital Signs Assessment: post-procedure vital signs reviewed and stable Respiratory status: spontaneous breathing, nonlabored ventilation and respiratory function stable Cardiovascular status: blood pressure returned to baseline Postop Assessment: no apparent nausea or vomiting Anesthetic complications: no     Last Vitals:  Vitals:   07/16/17 1125 07/16/17 1130  BP: (!) 185/105 (!) 196/114  Pulse: 94 91  Resp: 11 13  Temp: 36.8 C   SpO2: 100% 100%    Last Pain:  Vitals:   07/16/17 1125  TempSrc:   PainSc: Asleep                 Summer Mccolgan J

## 2017-07-16 NOTE — Progress Notes (Signed)
Patient arrived from OR with c/o some mild nausea and pain to his stomach.  BP was elevated and Dr. Delena Bali was made aware.  10 mg of Hydralazine was ordered and administered.  Patient began to state that he "could not see", mild upper extremity tremors were noted and patient became very anxious.  Dr Delena Bali was at bedside and ordered 0.5 mg of Ativan, 6.25 mg of Phenergan, and 5 mg of Labetalol.  Dr Delena Bali also wanted to consult with the patient's hospitalist.   Dr. Wynetta Emery was paged and came to bedside.  Patient continued with c/o abd pain and nausea.  BP continued to be elevated.  0.5 mg of Ativan and 5 mg of Labetalol was again ordered and administered.  Dr Wynetta Emery ordered 40 mg of Protonix IVP and 20 mg of Bentyl.  Dr Wynetta Emery also asked for the PACU to give his eye drops to the patient.  The eye drops were obtained and administered.  0.5 mg of Dilaudid was ordered and given.  The patient was then transferred back to his bed assignment.  Patient was seen resting comfortably during bedside report.

## 2017-07-16 NOTE — Brief Op Note (Signed)
BRIEF OPERATIVE NOTE  DATE OF PROCEDURE 07/16/2017  SURGEON Marcheta Grammes, DPM  ASSISTANT SURGEON None  OR STAFF Circulator: Glory Rosebush, RN Scrub Person: Lucie Leather, CST   PREOPERATIVE DIAGNOSIS 1.  Necrotic ulceration, right foot 2.  Osteomyelitis of first ray, right foot 3.  Diabetes mellitus with peripheral neuropathy  POSTOPERATIVE DIAGNOSIS Same  PROCEDURE Irrigation and debridement of ulceration, right foot  ANESTHESIA Monitor Anesthesia Care   HEMOSTASIS Pneumatic ankle tourniquet applied but not inflated  ESTIMATED BLOOD LOSS ~10 cc  MATERIALS USED Packing gauze  INJECTABLES None  COMPLICATIONS None

## 2017-07-16 NOTE — Care Management Note (Signed)
Case Management Note  Patient Details  Name: Timothy Clark: [id number] Date of Birth: 1971-03-10  Subjective/Objective:       Admitted with osteomylitis in LE. Pt is from home, lives with his father in [place]. He was here previously for same dx. He is on PO abx pta, was getting hyperbaric therapy in [place] 5 days a week and active with [place] through [facility]. He ambulates with no assistive device. He has agreed to debridement (done today) but refuses recommended amputation of his toe. He wants to follow up with his MD.             Action/Plan: Jamey Reas [facility] rep, aware of admission. CM following and will notify [facility] when pt is DC'd. Pt will need orders to resume services.   Expected Discharge Date:     07/17/2017             Expected Discharge Plan:  [place]  In-House Referral:  NA  Discharge planning Services  CM Consult  Post Acute Care Choice:  Home Health Choice offered to:  Patient  HH Arranged:  RN [facility] Agency:  [place]  Status of Service:  In process, will continue to follow  Sherald Barge, RN 07/16/2017, 2:01 PM

## 2017-07-16 NOTE — Addendum Note (Signed)
Addendum  created 07/16/17 1224 by Mikey College, MD   Order list changed

## 2017-07-16 NOTE — Addendum Note (Signed)
Addendum  created 07/16/17 1313 by Mikey College, MD   Order list changed

## 2017-07-16 NOTE — Progress Notes (Signed)
Podiatry Progress Note  Subjective Timothy Clark is scheduled for debridement of ulceration of right foot today.  No new complaints.  Objective Vital signs in last 24 hours:   Temp:  [98.3 F (36.8 C)-99.2 F (37.3 C)] 98.4 F (36.9 C) (12/06 0900) Pulse Rate:  [72-89] 88 (12/06 0600) Resp:  [0-19] 15 (12/06 1005) BP: (145-197)/(79-103) 163/93 (12/06 1000) SpO2:  [96 %-100 %] 97 % (12/06 1005)  DRESSING:  Intact.  Lab/Test Results  Recent Labs    07/15/17 0620 07/16/17 0526  WBC 8.6 9.7  HGB 11.0* 11.3*  HCT 32.1* 33.4*  PLT 151 149*  NA 136 136  K 3.6 3.8  CL 101 104  CO2 26 24  BUN 18 24*  CREATININE 1.42* 1.68*  GLUCOSE 274* 267*  CALCIUM 8.5* 8.3*    Recent Results (from the past 240 hour(s))  Blood Culture (routine x 2)     Status: None (Preliminary result)   Collection Time: 07/14/17  8:18 PM  Result Value Ref Range Status   Specimen Description BLOOD RIGHT HAND  Final   Special Requests   Final    BOTTLES DRAWN AEROBIC AND ANAEROBIC Blood Culture results may not be optimal due to an inadequate volume of blood received in culture bottles   Culture NO GROWTH 2 DAYS  Final   Report Status PENDING  Incomplete  Blood Culture (routine x 2)     Status: None (Preliminary result)   Collection Time: 07/14/17  8:18 PM  Result Value Ref Range Status   Specimen Description BLOOD RIGHT FOREARM  Final   Special Requests   Final    BOTTLES DRAWN AEROBIC AND ANAEROBIC Blood Culture results may not be optimal due to an inadequate volume of blood received in culture bottles   Culture NO GROWTH 2 DAYS  Final   Report Status PENDING  Incomplete  Surgical pcr screen     Status: None   Collection Time: 07/15/17  2:35 PM  Result Value Ref Range Status   MRSA, PCR NEGATIVE NEGATIVE Final   Staphylococcus aureus NEGATIVE NEGATIVE Final    Comment: (NOTE) The Xpert SA Assay (FDA approved for NASAL specimens in patients 67 years of age and older), is one component of a  comprehensive surveillance program. It is not intended to diagnose infection nor to guide or monitor treatment.      Ct Abdomen Pelvis Wo Contrast  Result Date: 07/14/2017 CLINICAL DATA:  46 year old male with nausea and vomiting abdominal pain. EXAM: CT ABDOMEN AND PELVIS WITHOUT CONTRAST TECHNIQUE: Multidetector CT imaging of the abdomen and pelvis was performed following the standard protocol without IV contrast. COMPARISON:  Abdominal radiograph dated 07/08/2014 and CT dated 12/22/2016 FINDINGS: Evaluation of this exam is limited in the absence of intravenous contrast. Lower chest: The visualized lung bases are clear. There is no intra-abdominal free air or free fluid. Hepatobiliary: No focal liver abnormality is seen. No gallstones, gallbladder wall thickening, or biliary dilatation. Pancreas: Unremarkable. No pancreatic ductal dilatation or surrounding inflammatory changes. Spleen: Normal in size without focal abnormality. Adrenals/Urinary Tract: The adrenal glands are unremarkable. 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 There is no hydronephrosis or nephrolithiasis on either side. 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 The visualized ureters and urinary bladder appear unremarkable. Stomach/Bowel: There is a moderate colonic stool burden. There is no bowel obstruction or active inflammation. The appendix is normal. Vascular/Lymphatic: The abdominal aorta and IVC are grossly unremarkable on  this noncontrast CT. No portal venous gas. There is no adenopathy. Reproductive: The prostate and seminal vesicles are grossly unremarkable. Other: Small fat containing right inguinal hernia. Musculoskeletal: There is degenerative changes of the spine most prominent at L4-L5 with bridging osteophyte anteriorly. No acute osseous pathology. IMPRESSION: 1. No acute intra-abdominal or pelvic pathology. Moderate colonic stool burden. No bowel obstruction  or active inflammation. Normal appendix. 2. No hydronephrosis or nephrolithiasis. Electronically Signed   By: Anner Crete M.D.   On: 07/14/2017 22:24   Ct Head Wo Contrast  Result Date: 07/14/2017 CLINICAL DATA:  Chest pain, shortness of breath, anxiety, abdominal pain, vision changes. EXAM: CT HEAD WITHOUT CONTRAST TECHNIQUE: Contiguous axial images were obtained from the base of the skull through the vertex without intravenous contrast. COMPARISON:  CT HEAD in January 26, 2017 FINDINGS: BRAIN: No intraparenchymal hemorrhage, mass effect nor midline shift. Mild parenchymal brain volume loss for age. No hydrocephalus . No acute large vascular territory infarcts. No abnormal extra-axial fluid collections. Basal cisterns are patent. VASCULAR: Trace calcific atherosclerosis carotid siphons. SKULL/SOFT TISSUES: No skull fracture. No significant soft tissue swelling. Similar RIGHT parietal scalp scarring. ORBITS/SINUSES: Status post LEFT ocular lens implant with similar probable reservoir, possible glaucoma drainage device. Small bilateral mastoid effusions without air cell coalescence. Paranasal sinuses are well-aerated. OTHER: None. IMPRESSION: 1. No acute intracranial process. 2. Mild parenchymal brain volume loss for age. Electronically Signed   By: Elon Alas M.D.   On: 07/14/2017 22:25   Mr Foot Right Wo Contrast  Result Date: 07/15/2017 CLINICAL DATA:  Diabetic patient with a chronic, nonhealing wound along the distal first metatarsal. Osteomyelitis in the distal first metatarsal on prior MRI. EXAM: MRI OF THE RIGHT FOREFOOT WITHOUT CONTRAST TECHNIQUE: Multiplanar, multisequence MR imaging of the right forefoot was performed. No intravenous contrast was administered. COMPARISON:  MRI right foot 03/19/2017. Plain films right foot 11/18/2016 and 07/14/2017. FINDINGS: This examination was ordered with and without contrast but contrast could not be given due to the patient's renal insufficiency.  Bones/Joint/Cartilage There is marrow edema with corresponding decreased T1 signal in approximately the distal 4 cm of the first metatarsal, throughout the proximal phalanx of the great toe and in both the medial and lateral sesamoids of the first MTP joint. Bony destructive change along the lateral cortex of the distal first metatarsal is identified. There is a first MTP joint effusion. Ligaments Intact. Muscles and Tendons There is edema throughout intrinsic musculature of the foot. No intramuscular fluid collection is identified. Soft tissues A large skin ulceration is seen along the medial margin of the first metatarsal head. Fluid collection in the first intermetatarsal space measures 1.3 cm craniocaudal by 0.5 cm transverse by 1 cm long. No other focal fluid collection is identified. IMPRESSION: Markedly worsened appearance of the patient's right foot since the comparison MRI. Abnormal marrow signal in the distal 4 cm of the first metatarsal, throughout the proximal phalanx of the great toe and in both the medial and lateral sesamoids of the first MTP joint is consistent with osteomyelitis. First MTP joint effusion is likely due to septic joint. Intense subcutaneous edema over the foot consistent with cellulitis. Fluid in the first intermetatarsal space is consistent with bursitis, possibly septic. Large skin ulceration adjacent to the first metatarsal head is noted. Electronically Signed   By: Inge Rise M.D.   On: 07/15/2017 09:56   US Arterial Abi (screening Lower Extremity)  Result Date: 07/15/2017 CLINICAL DATA:  Diabetic foot wound, poor healing EXAM:  NONINVASIVE PHYSIOLOGIC VASCULAR STUDY OF BILATERAL LOWER EXTREMITIES TECHNIQUE: Evaluation of both lower extremities were performed at rest, including calculation of ankle-brachial indices with single level Doppler, pressure and pulse volume recording. COMPARISON:  12/06/2016 FINDINGS: Right ABI:  Not obtainable, vessels are noncompressible Left  ABI:  Not obtainable, vessels are noncompressible Right Lower Extremity:  Normal arterial waveforms at the ankle. Left Lower Extremity:  Normal arterial waveforms at the ankle. Accurate ABIs cannot be obtained secondary to noncompressible vessels at the ankle. IMPRESSION: ABIs cannot be obtained secondary to vessel noncompressibility No significant waveform abnormality involving the posterior tibial and dorsalis pedis arteries bilaterally. Electronically Signed   By: Jerilynn Mages.  Shick M.D.   On: 07/15/2017 12:44   Dg Foot Complete Right  Result Date: 07/15/2017 CLINICAL DATA:  46 y/o M; laceration to medial aspect of right foot. EXAM: RIGHT FOOT COMPLETE - 3+ VIEW COMPARISON:  02/25/2017 right foot radiographs. FINDINGS: Bony defect of medial aspect of the first metatarsal head with a bone displaced medially in the soft tissues as well as overlying soft tissue irregularity. Joint spaces are well maintained. Vascular calcifications noted. Small dorsal calcaneal enthesophyte. IMPRESSION: Bony defect of medial aspect of first metatarsal head with a bone displaced medially into soft tissues. In the setting of trauma this may represent a soft tissue and bone laceration. Destructive changes of osteomyelitis are possible in the setting of infection. Electronically Signed   By: Kristine Garbe M.D.   On: 07/15/2017 00:26    Medications Scheduled Meds: . [MAR Hold] amLODipine  5 mg Oral Daily  . [MAR Hold] atorvastatin  10 mg Oral Daily  . [MAR Hold] brimonidine  1 drop Both Eyes BID  . [MAR Hold] enoxaparin (LOVENOX) injection  70 mg Subcutaneous Q24H  . [MAR Hold] hydrocerin   Topical BID  . [MAR Hold] insulin aspart  0-20 Units Subcutaneous TID WC  . [MAR Hold] insulin aspart  8 Units Subcutaneous TID WC  . [MAR Hold] insulin glargine  25 Units Subcutaneous QHS  . [MAR Hold] metoprolol succinate  50 mg Oral Daily  . [MAR Hold] pantoprazole  40 mg Oral BID AC  . [MAR Hold] polyethylene glycol  17 g Oral  BID  . [MAR Hold] senna  1 tablet Oral BID  . [MAR Hold] sertraline  100 mg Oral Daily  . [MAR Hold] sucralfate  1 g Oral QID  . [MAR Hold] timolol  1 drop Both Eyes BID   Continuous Infusions: . sodium chloride 10 mL/hr at 07/16/17 1001  . 0.9 % NaCl with KCl 20 mEq / L Stopped (07/16/17 0845)  . [MAR Hold] aztreonam 2 g (07/16/17 0900)  . [MAR Hold] levofloxacin (LEVAQUIN) IV Stopped (07/15/17 2130)  . [MAR Hold] vancomycin Stopped (07/15/17 2239)   PRN Meds:.[MAR Hold] gabapentin, [MAR Hold] LORazepam, [MAR Hold] ondansetron (ZOFRAN) IV  Assessment 1. Sepsis with cellulitis of the right foot. 2. Osteomylitis first ray of right foot. 3.  Ulceration of right foot with necrosis. 4. Diabetes mellitus with peripheral neuropathy.   Plan Signed consent on chart.  Lovenox held.  Proceed with debridement of right foot as planned.  Danitza Schoenfeldt IVAN 07/16/2017, 10:17 AM

## 2017-07-16 NOTE — Progress Notes (Signed)
Inpatient Diabetes Program Recommendations  AACE/ADA: New Consensus Statement on Inpatient Glycemic Control (2015)  Target Ranges:  Prepandial:   less than 140 mg/dL      Peak postprandial:   less than 180 mg/dL (1-2 hours)      Critically ill patients:  140 - 180 mg/dL   Results for KIRE, FERG (MRN [id number]) as of 07/16/2017 08:14  Ref. Range 07/15/2017 08:11 07/15/2017 11:19 07/15/2017 16:25 07/15/2017 20:39 07/16/2017 08:06  Glucose-Capillary Latest Ref Range: 65 - 99 mg/dL 239 (H) 222 (H) 88 136 (H) 225 (H)   Review of Glycemic Control  Diabetes history: DM2 Outpatient Diabetes medications: Levemir 10 units QHS, 70/30 35 units BID Current orders for Inpatient glycemic control: Lantus 25 units QHS, Novolog 8 units TID with meals, Novolog 0-20 units TID with meals  Inpatient Diabetes Program Recommendations:  Insulin - Basal: Noted patient did NOT receive any Lantus last night (per MAR, not given per MD instructions). Fasting glucose 225 mg/dl this morning.  Would not recommend increasing Lantus at this time.  NOTE: Patient is NPO for debridement procedure today.   Thanks, Barnie Alderman, RN, MSN, CDE Diabetes Coordinator Inpatient Diabetes Program [phone] (Team Pager from 8am to 5pm)

## 2017-07-16 NOTE — Op Note (Signed)
OPERATIVE NOTE  DATE OF PROCEDURE 07/16/2017  SURGEON Marcheta Grammes, DPM  ASSISTANT SURGEON None  OR STAFF Circulator: Glory Rosebush, RN Scrub Person: Lucie Leather, CST   PREOPERATIVE DIAGNOSIS 1.  Necrotic ulceration, right foot 2.  Osteomyelitis of first ray, right foot 3.  Diabetes mellitus with peripheral neuropathy  POSTOPERATIVE DIAGNOSIS Same  PROCEDURE Irrigation and debridement of ulceration, right foot  ANESTHESIA Monitor Anesthesia Care   HEMOSTASIS Pneumatic ankle tourniquet applied but not inflated  ESTIMATED BLOOD LOSS ~10 cc  MATERIALS USED Packing gauze  INJECTABLES None  COMPLICATIONS None  INDICATIONS:  Necrotic ulceration of the right foot.  DESCRIPTION OF THE PROCEDURE:  The patient was brought to the operating room and placed on the operative table in the supine position.  A pneumatic ankle tourniquet was applied to the operative extremity.  The foot was then prepped, scrubbed, and draped in the usual sterile technique.    A sharp excisional debridement of the ulceration was performed using a #15 blade and soft tissue nipper.  Prior to debridement the ulceration measured 1.6 x 2.0 x 0.9 cm.  [Adherent, nonviable fibrotic and necrotic tissue] including subcutaneous tissue including muscle and fascia was debrided and removed.  Following debridement, the ulceration measured 1.6 x 2.0 x 2.8 cm.  The ulceration tunneled 2.8 cm from 12:00 to 6:00 position.  The patient tolerated procedure well and no anesthesia was required.    The tunnel was packed with 1/2" packing gauze.  A sterile compressive dressing was applied to the right foot.  The patient tolerated the procedure well.  The patient was then transferred to PACU with vital signs stable and vascular status intact to all toes of the operative foot.

## 2017-07-16 NOTE — Progress Notes (Signed)
Pt c/o abdominal pain, BP elevated. Pt states the pain is similar to PACU experience from earlier today. Pt assisted with urinal. Dr. Wynetta Emery paged and made aware. No new orders at this time. PRN Zofran and scheduled Bentyl given per order. Will continue to monitor.

## 2017-07-16 NOTE — Anesthesia Preprocedure Evaluation (Signed)
Anesthesia Evaluation  Patient identified by MRN, date of birth, ID band Patient awake    Airway Mallampati: I  TM Distance: >3 FB Neck ROM: Full    Dental   Pulmonary    breath sounds clear to auscultation       Cardiovascular Exercise Tolerance: Poor hypertension, + Peripheral Vascular Disease  Normal cardiovascular exam Rhythm:Regular Rate:Normal  NSR, multiple PVC's   Neuro/Psych PSYCHIATRIC DISORDERS Depression Schizophrenia    GI/Hepatic GERD  Medicated and Controlled,  Endo/Other  diabetes  Renal/GU Renal diseaseResults for IHAN, PAT (MRN [id number]) as of 07/16/2017 09:36  07/15/2017 20:39  07/16/2017 05:26 Sodium: 136 Potassium: 3.8 Chloride: 104 CO2: 24 Glucose: 267 (H) BUN: 24 (H) Creatinine: 1.68 (H) Calcium: 8.3 (L)      Musculoskeletal   Abdominal   Peds  Hematology  (+) anemia , Results for TEVIN, SH[place]ORD (MRN [id number]) as of 07/16/2017 09:36  07/15/2017 20:39  07/16/2017 05:26 Sodium: 136 Potassium: 3.8 Chloride: 104 CO2: 24 Glucose: 267 (H) BUN: 24 (H) Creatinine: 1.68 (H) Calcium: 8.3 (L)    Anesthesia Other Findings   Reproductive/Obstetrics                             Anesthesia Physical Anesthesia Plan  ASA: IV  Anesthesia Plan: MAC   Post-op Pain Management:    Induction:   PONV Risk Score and Plan:   Airway Management Planned: Nasal Cannula  Additional Equipment:   Intra-op Plan:   Post-operative Plan:   Informed Consent: I have reviewed the patients History and Physical, chart, labs and discussed the procedure including the risks, benefits and alternatives for the proposed anesthesia with the patient or authorized representative who has indicated his/her understanding and acceptance.   Dental advisory given  Plan Discussed with: CRNA  Anesthesia Plan Comments:         Anesthesia Quick Evaluation

## 2017-07-17 ENCOUNTER — Inpatient Hospital Stay (HOSPITAL_COMMUNITY)
Admit: 2017-07-17 | Discharge: 2017-07-17 | Disposition: A | Discharge status: Home / Self Care | Payer: Medicare Other | Attending: Family Medicine | Admitting: Family Medicine

## 2017-07-17 ENCOUNTER — Inpatient Hospital Stay (HOSPITAL_COMMUNITY): Payer: Medicare Other

## 2017-07-17 ENCOUNTER — Encounter (HOSPITAL_COMMUNITY): Payer: Self-pay | Admitting: Podiatry

## 2017-07-17 DIAGNOSIS — E1143 Type 2 diabetes mellitus with diabetic autonomic (poly)neuropathy: Secondary | ICD-10-CM

## 2017-07-17 DIAGNOSIS — K3184 Gastroparesis: Secondary | ICD-10-CM

## 2017-07-17 DIAGNOSIS — L0390 Cellulitis, unspecified: Secondary | ICD-10-CM

## 2017-07-17 DIAGNOSIS — E1165 Type 2 diabetes mellitus with hyperglycemia: Secondary | ICD-10-CM

## 2017-07-17 DIAGNOSIS — L089 Local infection of the skin and subcutaneous tissue, unspecified: Secondary | ICD-10-CM

## 2017-07-17 DIAGNOSIS — A419 Sepsis, unspecified organism: Principal | ICD-10-CM

## 2017-07-17 DIAGNOSIS — E11628 Type 2 diabetes mellitus with other skin complications: Secondary | ICD-10-CM

## 2017-07-17 DIAGNOSIS — H409 Unspecified glaucoma: Secondary | ICD-10-CM

## 2017-07-17 DIAGNOSIS — E118 Type 2 diabetes mellitus with unspecified complications: Secondary | ICD-10-CM

## 2017-07-17 DIAGNOSIS — D649 Anemia, unspecified: Secondary | ICD-10-CM

## 2017-07-17 DIAGNOSIS — I10 Essential (primary) hypertension: Secondary | ICD-10-CM

## 2017-07-17 DIAGNOSIS — K219 Gastro-esophageal reflux disease without esophagitis: Secondary | ICD-10-CM

## 2017-07-17 LAB — CBC
HCT: 31 % — ABNORMAL LOW (ref 39.0–52.0)
Hemoglobin: 10.5 g/dL — ABNORMAL LOW (ref 13.0–17.0)
MCH: 30.9 pg (ref 26.0–34.0)
MCHC: 33.9 g/dL (ref 30.0–36.0)
MCV: 91.2 fL (ref 78.0–100.0)
Platelets: 142 10*3/uL — ABNORMAL LOW (ref 150–400)
RBC: 3.4 MIL/uL — ABNORMAL LOW (ref 4.22–5.81)
RDW: 13 % (ref 11.5–15.5)
WBC: 10.2 10*3/uL (ref 4.0–10.5)

## 2017-07-17 LAB — CBC WITH DIFFERENTIAL/PLATELET
Basophils Absolute: 0 10*3/uL (ref 0.0–0.1)
Basophils Relative: 0 %
Eosinophils Absolute: 0 10*3/uL (ref 0.0–0.7)
Eosinophils Relative: 0 %
HCT: 30.7 % — ABNORMAL LOW (ref 39.0–52.0)
Hemoglobin: 10.4 g/dL — ABNORMAL LOW (ref 13.0–17.0)
Lymphocytes Relative: 11 %
Lymphs Abs: 1.2 10*3/uL (ref 0.7–4.0)
MCH: 31 pg (ref 26.0–34.0)
MCHC: 33.9 g/dL (ref 30.0–36.0)
MCV: 91.6 fL (ref 78.0–100.0)
Monocytes Absolute: 0.7 10*3/uL (ref 0.1–1.0)
Monocytes Relative: 6 %
Neutro Abs: 8.9 10*3/uL — ABNORMAL HIGH (ref 1.7–7.7)
Neutrophils Relative %: 83 %
Platelets: 147 10*3/uL — ABNORMAL LOW (ref 150–400)
RBC: 3.35 MIL/uL — ABNORMAL LOW (ref 4.22–5.81)
RDW: 13.1 % (ref 11.5–15.5)
WBC: 10.8 10*3/uL — ABNORMAL HIGH (ref 4.0–10.5)

## 2017-07-17 LAB — BASIC METABOLIC PANEL
Anion gap: 7 (ref 5–15)
Anion gap: 7 (ref 5–15)
BUN: 28 mg/dL — ABNORMAL HIGH (ref 6–20)
BUN: 29 mg/dL — ABNORMAL HIGH (ref 6–20)
CO2: 25 mmol/L (ref 22–32)
CO2: 24 mmol/L (ref 22–32)
Calcium: 8.2 mg/dL — ABNORMAL LOW (ref 8.9–10.3)
Calcium: 8.2 mg/dL — ABNORMAL LOW (ref 8.9–10.3)
Chloride: 103 mmol/L (ref 101–111)
Chloride: 103 mmol/L (ref 101–111)
Creatinine, Ser: 1.95 mg/dL — ABNORMAL HIGH (ref 0.61–1.24)
Creatinine, Ser: 1.95 mg/dL — ABNORMAL HIGH (ref 0.61–1.24)
GFR calc Af Amer: 45 mL/min — ABNORMAL LOW (ref >60)
GFR calc Af Amer: 45 mL/min — ABNORMAL LOW (ref >60)
GFR calc non Af Amer: 39 mL/min — ABNORMAL LOW (ref >60)
GFR calc non Af Amer: 39 mL/min — ABNORMAL LOW (ref >60)
Glucose, Bld: 213 mg/dL — ABNORMAL HIGH (ref 65–99)
Glucose, Bld: 245 mg/dL — ABNORMAL HIGH (ref 65–99)
Potassium: 3.3 mmol/L — ABNORMAL LOW (ref 3.5–5.1)
Potassium: 3.3 mmol/L — ABNORMAL LOW (ref 3.5–5.1)
Sodium: 135 mmol/L (ref 135–145)
Sodium: 134 mmol/L — ABNORMAL LOW (ref 135–145)

## 2017-07-17 LAB — TROPONIN I: Troponin I: <0.03 ng/mL (ref <0.03)

## 2017-07-17 LAB — ECHOCARDIOGRAM COMPLETE
E decel time: 194 msec
E/e' ratio: 15.16
FS: 40 % (ref 28–44)
Height: 72 in
IVS/LV PW RATIO, ED: 1.4
LA ID, A-P, ES: 31 mm
LA diam end sys: 31 mm
LA diam index: 1.16 cm/m2
LA vol A4C: 46.7 ml
LA vol index: 16.2 mL/m2
LA vol: 43.5 mL
LV E/e' medial: 15.16
LV E/e'average: 15.16
LV PW d: 10.1 mm — AB (ref 0.6–1.1)
LV dias vol index: 39 mL/m2
LV dias vol: 105 mL (ref 62–150)
LV e' LATERAL: 6.53 cm/s
LV sys vol index: 15 mL/m2
LV sys vol: 40 mL (ref 21–61)
LVOT SV: 76 mL
LVOT VTI: 22.1 cm
LVOT area: 3.46 cm2
LVOT diameter: 21 mm
LVOT peak grad rest: 3 mmHg
LVOT peak vel: 93.3 cm/s
Lateral S' vel: 19.5 cm/s
MV Dec: 194
MV Peak grad: 4 mmHg
MV pk A vel: 77.6 m/s
MV pk E vel: 99 m/s
Simpson's disk: 62
Stroke v: 65 ml
TAPSE: 27 mm
TDI e' lateral: 6.53
TDI e' medial: 5.11
Weight: 4788.8 oz

## 2017-07-17 LAB — GLUCOSE, CAPILLARY
Glucose-Capillary: 199 mg/dL — ABNORMAL HIGH (ref 65–99)
Glucose-Capillary: 231 mg/dL — ABNORMAL HIGH (ref 65–99)
Glucose-Capillary: 197 mg/dL — ABNORMAL HIGH (ref 65–99)
Glucose-Capillary: 122 mg/dL — ABNORMAL HIGH (ref 65–99)

## 2017-07-17 LAB — MAGNESIUM: Magnesium: 1.5 mg/dL — ABNORMAL LOW (ref 1.7–2.4)

## 2017-07-17 LAB — PROTIME-INR
INR: 1.21
Prothrombin Time: 15.2 seconds (ref 11.4–15.2)

## 2017-07-17 LAB — LIPID PANEL
Cholesterol: 143 mg/dL (ref 0–200)
HDL: 27 mg/dL — ABNORMAL LOW (ref >40)
LDL Cholesterol: 88 mg/dL (ref 0–99)
Total CHOL/HDL Ratio: 5.3 RATIO
Triglycerides: 140 mg/dL (ref <150)
VLDL: 28 mg/dL (ref 0–40)

## 2017-07-17 LAB — HEMOGLOBIN A1C
Hgb A1c MFr Bld: 7.5 % — ABNORMAL HIGH (ref 4.8–5.6)
Mean Plasma Glucose: 168.55 mg/dL

## 2017-07-17 LAB — APTT: aPTT: 33 seconds (ref 24–36)

## 2017-07-17 MED ORDER — AMLODIPINE BESYLATE 5 MG PO TABS
10.0000 mg | ORAL_TABLET | Freq: Every day | ORAL | Status: DC
  Administered 2017-07-18: 10 mg via ORAL
  Filled 2017-07-17: qty 2

## 2017-07-17 MED ORDER — STROKE: EARLY STAGES OF RECOVERY BOOK
Freq: Once | Status: AC
  Administered 2017-07-18: 09:00:00
  Filled 2017-07-17: qty 1

## 2017-07-17 MED ORDER — METOCLOPRAMIDE HCL 10 MG PO TABS
10.0000 mg | ORAL_TABLET | Freq: Three times a day (TID) | ORAL | Status: DC
  Administered 2017-07-17 – 2017-07-18 (×4): 10 mg via ORAL
  Filled 2017-07-17 (×4): qty 1

## 2017-07-17 MED ORDER — ALPRAZOLAM 0.5 MG PO TABS
0.5000 mg | ORAL_TABLET | Freq: Two times a day (BID) | ORAL | Status: DC | PRN
Start: 2017-07-17 — End: 2017-07-18
  Administered 2017-07-17 – 2017-07-18 (×2): 0.5 mg via ORAL
  Filled 2017-07-17 (×2): qty 1

## 2017-07-17 MED ORDER — SODIUM CHLORIDE 0.9 % IV BOLUS (SEPSIS)
750.0000 mL | Freq: Once | INTRAVENOUS | Status: AC
  Administered 2017-07-17: 750 mL via INTRAVENOUS

## 2017-07-17 MED ORDER — INSULIN ASPART 100 UNIT/ML ~~LOC~~ SOLN
0.0000 [IU] | SUBCUTANEOUS | Status: DC
  Administered 2017-07-17: 4 [IU] via SUBCUTANEOUS
  Administered 2017-07-18 (×2): 3 [IU] via SUBCUTANEOUS

## 2017-07-17 MED ORDER — CLOPIDOGREL BISULFATE 75 MG PO TABS: 75.0000 mg | ORAL_TABLET | Freq: Every day | ORAL | Status: DC

## 2017-07-17 MED ORDER — SODIUM CHLORIDE 0.9 % IV SOLN
INTRAVENOUS | Status: DC
  Administered 2017-07-17: 19:00:00 via INTRAVENOUS

## 2017-07-17 MED ORDER — POTASSIUM CHLORIDE CRYS ER 20 MEQ PO TBCR
40.0000 meq | EXTENDED_RELEASE_TABLET | Freq: Once | ORAL | Status: DC
  Filled 2017-07-17: qty 2

## 2017-07-17 MED ORDER — METOPROLOL SUCCINATE ER 50 MG PO TB24
75.0000 mg | ORAL_TABLET | Freq: Every day | ORAL | Status: DC
  Administered 2017-07-18: 75 mg via ORAL
  Filled 2017-07-17: qty 1

## 2017-07-17 NOTE — Progress Notes (Addendum)
PROGRESS NOTE   Timothy Clark  SNK:[id number]  DOB: [birth year]  DOA: 07/14/2017 PCP: Tawni Carnes, PA-C  Brief Admission Hx: Timothy Clark is a 46 y.o. male with medical history significant of chronic abdominal pain, esophagitis, gastritis, gastroparesis, GERD, history of H. pylori infection, hiccups, recurring nausea and vomiting, hemorrhage, glaucoma, hypertension, hyperlipidemia, type 2 diabetes who is coming to the emergency department with complaints of abdominal pain, burning chest pain, nausea and 10 or more episodes of emesis since this morning.  He was admitted for sepsis, cellulitis and constipation.   CODE STROKE DOCUMENTATION I received a call from the RN that patient developed acute slurred speech with aphasia, confusion and left side weakness at 10:25 am.  I asked for a CODE STROKE to be called.  I immediately came to assess patient and found him to be confused and had slurred speech with slight facial droop and left sided weakness. I went with him to CT scanner and came back up to room with him and stayed for teleneurologist evaluation.  Radiology called me and told me that the prelim CT head was without acute change.  The patient is recently postop and teleneurology evaluated him and he is not a candidate for tPA.  His father and other family members were in the room.  They were updated. The neurologist recommended that patient get a stroke workup performed.  I placed consult for inpatient neurology.  I ordered an EEG and an MRI brain.  Plavix ordered for antiplatelet therapy as pt reports significant aspirin allergy.  Critical Care time spent 75 mins.  See orders.    MDM/Assessment & Plan:   1. Sepsis secondary to cellulitis and osteomyelitis of right foot - MRI and plain film imaging of right foot highly suspicious for osteomyelitis.  Continue IV antibiotics, obtain podiatry consultation.  I spoke with podiatrist and he is recommending debridement - pt declined amputation.   Continue antibiotics, await deep wound culture results.    2. Acute CVA - stroke work up initiated, neurology consulted, teleneuro worried about endocarditis given active infection, echo pending.   EEG ordered. MRI brain pending.  3. 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  A1c 6.7%.   (Pt is NPO until he has speech evaluation) 4. GAD - severe anxiety disorder, lorazepam ordered as needed.   5. Insensate feet - Pt is at very HIGH RISK for amputation.  6. Dyslipidemia - likely secondary to poorly controlled diabetes mellitus.  Resume home atorvastatin.  7. Glaucoma - continue home eye drops.  8. Stage 3 CKD - mild bump in Cr likely related to dehydration, he is not eating and drinking well, give IVFs.  Follow.  9. Severe diabetic gastroparesis - IVFs, IV nausea meds and IV metoclopromide ordered and still no improvement, will ask for GI evaluation for possible EGD.   10. Hypokalemia - repleting.  11. GERD - IV protonix ordered.  12. Moderate constipation - likely causing most of symptoms, laxative ordered.  13. Anemia unspecified - monitor Hg closely.  Repeat CBC in AM.    DVT prophylaxis: SCDs Code Status: Full  Family Communication: father present Disposition Plan: TBD   Consultants:  podiatry  Subjective: Pt reports that he was nauseated after drinking the protein supplement yesterday.   Objective: Vitals:   07/17/17 0641 07/17/17 0935 07/17/17 1031 07/17/17 1131  BP: 138/68 (!) 194/99 (!) 165/81 (!) 149/70  Pulse: 89 96 92 87  Resp:  16   16  Temp: 98 F (36.7 C)   99.8 F (37.7 C)  TempSrc: Oral   Oral  SpO2: 98%  97% 100%  Weight:   135.8 kg (299 lb 4.8 oz)   Height:        Intake/Output Summary (Last 24 hours) at 07/17/2017 1148 Last data filed at 07/17/2017 0900 Gross per 24 hour  Intake 885 ml  Output 950 ml  Net -65 ml   Filed Weights   07/14/17 1855 07/15/17 0200 07/17/17  1031  Weight: 132.5 kg (292 lb) 132.5 kg (292 lb 1.8 oz) 135.8 kg (299 lb 4.8 oz)   REVIEW OF SYSTEMS  As per history otherwise all reviewed and reported negative  Exam:  General exam: awake, alert, NAD. Strabismus noted.  Dry MM. Respiratory system: Clear. No increased work of breathing. Cardiovascular system: S1 & S2 heard, RRR. No JVD, murmurs, gallops, clicks or pedal edema. Gastrointestinal system: Abdomen is nondistended, soft and nontender. Normal bowel sounds heard. Central nervous system: LUE/LLE 4/5, small left facial droop, slurred speech.  Extremities: right foot wound clean and dry.   Data Reviewed: Basic Metabolic Panel: Recent Labs  Lab 07/14/17 1927 07/15/17 0620 07/16/17 0526 07/17/17 0620  NA 138 136 136 135  K 3.4* 3.6 3.8 3.3*  CL 103 101 104 103  CO2 25 26 24 25   GLUCOSE 257* 274* 267* 213*  BUN 16 18 24* 28*  CREATININE 1.26* 1.42* 1.68* 1.95*  CALCIUM 9.3 8.5* 8.3* 8.2*  MG  --  1.3*  --  1.5*   Liver Function Tests: Recent Labs  Lab 07/14/17 1927  AST 21  ALT 18  ALKPHOS 66  BILITOT 0.7  PROT 7.5  ALBUMIN 3.6   Recent Labs  Lab 07/14/17 1927  LIPASE 39   No results for input(s): AMMONIA in the last 168 hours. CBC: Recent Labs  Lab 07/14/17 1927 07/15/17 0620 07/16/17 0526 07/17/17 0620  WBC 12.2* 8.6 9.7 10.8*  NEUTROABS  --  6.3 7.9* 8.9*  HGB 12.1* 11.0* 11.3* 10.4*  HCT 35.9* 32.1* 33.4* 30.7*  MCV 90.2 90.7 91.8 91.6  PLT 180 151 149* 147*   Cardiac Enzymes: No results for input(s): CKTOTAL, CKMB, CKMBINDEX, TROPONINI in the last 168 hours. CBG (last 3)  Recent Labs    07/16/17 2136 07/17/17 0742 07/17/17 1112  GLUCAP 211* 199* 231*   Recent Results (from the past 240 hour(s))  Blood Culture (routine x 2)     Status: None (Preliminary result)   Collection Time: 07/14/17  8:18 PM  Result Value Ref Range Status   Specimen Description BLOOD RIGHT HAND  Final   Special Requests   Final    BOTTLES DRAWN AEROBIC  AND ANAEROBIC Blood Culture results may not be optimal due to an inadequate volume of blood received in culture bottles   Culture NO GROWTH 3 DAYS  Final   Report Status PENDING  Incomplete  Blood Culture (routine x 2)     Status: None (Preliminary result)   Collection Time: 07/14/17  8:18 PM  Result Value Ref Range Status   Specimen Description BLOOD RIGHT FOREARM  Final   Special Requests   Final    BOTTLES DRAWN AEROBIC AND ANAEROBIC Blood Culture results may not be optimal due to an inadequate volume of blood received in culture bottles   Culture NO GROWTH 3 DAYS  Final   Report Status PENDING  Incomplete  Surgical pcr screen     Status: None  Collection Time: 07/15/17  2:35 PM  Result Value Ref Range Status   MRSA, PCR NEGATIVE NEGATIVE Final   Staphylococcus aureus NEGATIVE NEGATIVE Final    Comment: (NOTE) The Xpert SA Assay (FDA approved for NASAL specimens in patients 43 years of age and older), is one component of a comprehensive surveillance program. It is not intended to diagnose infection nor to guide or monitor treatment.      Studies: US Arterial Abi (screening Lower Extremity)  Result Date: 07/15/2017 CLINICAL DATA:  Diabetic foot wound, poor healing EXAM: NONINVASIVE PHYSIOLOGIC VASCULAR STUDY OF BILATERAL LOWER EXTREMITIES TECHNIQUE: Evaluation of both lower extremities were performed at rest, including calculation of ankle-brachial indices with single level Doppler, pressure and pulse volume recording. COMPARISON:  12/06/2016 FINDINGS: Right ABI:  Not obtainable, vessels are noncompressible Left ABI:  Not obtainable, vessels are noncompressible Right Lower Extremity:  Normal arterial waveforms at the ankle. Left Lower Extremity:  Normal arterial waveforms at the ankle. Accurate ABIs cannot be obtained secondary to noncompressible vessels at the ankle. IMPRESSION: ABIs cannot be obtained secondary to vessel noncompressibility No significant waveform abnormality involving  the posterior tibial and dorsalis pedis arteries bilaterally. Electronically Signed   By: Jerilynn Mages.  Shick M.D.   On: 07/15/2017 12:44   Ct Head Code Stroke Wo Contrast  Result Date: 07/17/2017 CLINICAL DATA:  Code stroke.  Ataxia.  Code stroke. EXAM: CT HEAD WITHOUT CONTRAST TECHNIQUE: Contiguous axial images were obtained from the base of the skull through the vertex without intravenous contrast. COMPARISON:  07/14/2017 FINDINGS: Brain: Normal appearance without evidence of atrophy, old or acute infarction, mass lesion, hemorrhage, hydrocephalus or extra-axial collection. Vascular: No abnormal vascular finding. Skull: Normal Sinuses/Orbits: Clear/normal. The patient does have fluid in the mastoid air cells on each side. Other: None ASPECTS ([place] Stroke Program Early CT Score) - Ganglionic level infarction (caudate, lentiform nuclei, internal capsule, insula, M1-M3 cortex): 7 - Supraganglionic infarction (M4-M6 cortex): 3 Total score (0-10 with 10 being normal): 10 IMPRESSION: 1. Normal appearance of the brain itself. 2. ASPECTS is 10. Bilateral mastoid effusions. These results were called by telephone at the time of interpretation on 07/17/2017 at 11:00 am to Dr. Irwin Brakeman , who verbally acknowledged these results. Electronically Signed   By: Nelson Chimes M.D.   On: 07/17/2017 11:03   Scheduled Meds: .  stroke: mapping our early stages of recovery book   Does not apply Once  . amLODipine  5 mg Oral Daily  . atorvastatin  10 mg Oral Daily  . brimonidine  1 drop Both Eyes BID  . clopidogrel  75 mg Oral Daily  . dicyclomine  20 mg Oral TID AC & HS  . enoxaparin (LOVENOX) injection  70 mg Subcutaneous Q24H  . hydrocerin   Topical BID  . insulin aspart  0-20 Units Subcutaneous TID WC  . insulin aspart  8 Units Subcutaneous TID WC  . insulin glargine  25 Units Subcutaneous QHS  . metoprolol succinate  50 mg Oral Daily  . pantoprazole  40 mg Oral BID AC  . polyethylene glycol  17 g Oral BID  .  potassium chloride  40 mEq Oral Once  . senna  2 tablet Oral BID  . sertraline  100 mg Oral Daily  . sucralfate  1 g Oral QID  . timolol  1 drop Both Eyes BID   Continuous Infusions: . sodium chloride    . aztreonam 2 g (07/17/17 0947)  . levofloxacin (LEVAQUIN) IV Stopped (07/16/17 2131)  .  sodium chloride    . vancomycin Stopped (07/16/17 2245)    Principal Problem:   Sepsis due to cellulitis [facility]) Active Problems:   Diabetes mellitus type 2 with complications, uncontrolled ([place])   Hyperlipidemia   Depression   Essential hypertension   Gastroparesis due to DM ([facility])   Hypokalemia   GERD (gastroesophageal reflux disease)   Glaucoma   Anemia   Diabetic foot infection [facility])  Critical Care Time spent: 61 mins  Irwin Brakeman, MD, FAAFP [facility] Pager [phone]  If 7PM-7AM, please contact night-coverage [email or web address] Password [email or web address] 07/17/2017, 11:48 AM    LOS: 3 days

## 2017-07-17 NOTE — Consult Note (Signed)
Referring Provider: [facility] Primary Care Physician:  Tawni Carnes, PA-C Primary Gastroenterologist:  Dr.   Date of Admission: 07/14/2017 Date of Consultation: 07/17/2017  Reason for Consultation:  N/V, query diabetic gastroparesis  HPI:  Timothy Clark is a 46 y.o. male with a past medical history of chronic abdominal pain, esophagitis, gastritis, gastroparesis, GERD, H pylori infection status post eradication, hiccups, nausea vomiting.  Present to the emergency department complains of abdominal pain, burning chest pain, nausea and 10 or more episodes of emesis since the morning of evaluation.  You admitted for sepsis, cellulitis, constipation.  He underwent an incision and drainage by podiatry.  We are asked to see him due to ongoing vomiting with a history of gastroparesis likely worsening diabetic gastroparesis.  Today he states he is having intermittent abdominal cramping and nausea/vomiting.  His nausea and vomiting restarted 2 days prior.  He does have persistent GERD symptoms.  He states his blood sugar is up and down.  His abdominal pain is epigastric.  He seems to be very anxious let him into see him.  He is having an episode of abdominal pain.  He states it tends to last about 1 hour.  Denies hematochezia or melena.  No other GI symptoms.  Past Medical History:  Diagnosis Date  . Chronic abdominal pain   . Esophagitis   . Eye hemorrhage   . Gastritis   . Gastroparesis   . GERD (gastroesophageal reflux disease)   . Glaucoma   . Helicobacter pylori (H. pylori) infection 2016   ERADICATION DOCUMENTED VIA STOOL TEST in AUG 2016 at [place]  . Hiccups   . Hypertension   . Nausea and vomiting    chronic, recurrent  . Type 2 diabetes mellitus with complications (HCC)    diagnosed around age 17    Past Surgical History:  Procedure Laterality Date  . CATARACT EXTRACTION W/PHACO Left 05/19/2016   Procedure: CATARACT EXTRACTION PHACO AND INTRAOCULAR LENS PLACEMENT  LEFT EYE;  Surgeon: Tonny Branch, MD;  Location: [facility];  Service: Ophthalmology;  Laterality: Left;  CDE: 7.30  . CATARACT EXTRACTION W/PHACO Right 06/23/2016   Procedure: CATARACT EXTRACTION PHACO AND INTRAOCULAR LENS PLACEMENT RIGHT EYE CDE=9.87;  Surgeon: Tonny Branch, MD;  Location: [facility];  Service: Ophthalmology;  Laterality: Right;  right  . ESOPHAGOGASTRODUODENOSCOPY  2015   Dr. Britta Mccreedy  . ESOPHAGOGASTRODUODENOSCOPY N/A 10/26/2014   RMR: Distal esophagititis-likely reflux related although an element of pill induced injuruy no exclueded  status post biopsy. Diffusely abnormal gastric mucosa of uncertain signigicane -status post gastric biopsy. Focal area of excoriation in the cardia most consistant with a trauma of heaving.   [name] EYE SURGERY  2015   stent placed to left eye  . EYE SURGERY    . INCISION AND DRAINAGE OF WOUND Right 07/16/2017   Procedure: IRRIGATION AND DEBRIDEMENT OF SOFT TISSUE OF ULCERATION RIGHT FOOT;  Surgeon: Caprice Beaver, DPM;  Location: [facility];  Service: Podiatry;  Laterality: Right;    Prior to Admission medications   Medication Sig Start Date End Date Taking? Authorizing Provider  atorvastatin (LIPITOR) 10 MG tablet Take 10 mg by mouth daily.   Yes [provider]  brimonidine (ALPHAGAN) 0.15 % ophthalmic solution Place 1 drop into both eyes 2 (two) times daily.    Yes [provider]  cetirizine (ZYRTEC) 10 MG tablet Take 10 mg by mouth daily.   Yes [provider]  clindamycin (CLEOCIN) 300 MG capsule Take 1 capsule (300 mg total)  3 (three) times daily by mouth. 06/22/17 07/22/17 Yes Dixon, Melton Krebs, NP  cyclobenzaprine (FLEXERIL) 10 MG tablet Take 10 mg by mouth at bedtime as needed for muscle spasms.    Yes [provider]  dicyclomine (BENTYL) 10 MG capsule Take 1 capsule (10 mg total) by mouth 4 (four) times daily -  before meals and at bedtime. Patient taking differently: Take 10 mg by mouth 2 (two) times daily.   12/26/16  Yes Annitta Needs, NP  hydrochlorothiazide (HYDRODIURIL) 25 MG tablet Take 25 mg by mouth daily.   Yes [provider]  insulin detemir (LEVEMIR) 100 UNIT/ML injection Inject 10 Units into the skin at bedtime.    Yes [provider]  insulin NPH-regular Human (NOVOLIN 70/30) (70-30) 100 UNIT/ML injection Inject 35 Units into the skin 2 (two) times daily.   Yes [provider]  lisinopril (PRINIVIL,ZESTRIL) 40 MG tablet Take 40 mg by mouth daily.   Yes [provider]  LORazepam (ATIVAN) 0.5 MG tablet Take 0.5 mg by mouth at bedtime as needed for anxiety.   Yes [provider]  metoCLOPramide (REGLAN) 10 MG tablet Take 1 tablet (10 mg total) every 6 (six) hours as needed by mouth for nausea or refractory nausea / vomiting (nausea/headache). 06/16/17  Yes Eber Jones, MD  metoprolol succinate (TOPROL-XL) 50 MG 24 hr tablet Take 50 mg by mouth daily. Take with or immediately following a meal.   Yes [provider]  ondansetron (ZOFRAN) 4 MG tablet Take 1 tablet (4 mg total) every 6 (six) hours by mouth. 06/16/17  Yes Eber Jones, MD  pantoprazole (PROTONIX) 40 MG tablet TAKE 1 TABLET BY MOUTH TWICE DAILY BEFORE  A  MEAL 03/23/17  Yes Mahala Menghini, PA-C  potassium chloride SA (K-DUR,KLOR-CON) 20 MEQ tablet Take 2 tablets (40 mEq total) 2 (two) times daily by mouth. 06/16/17  Yes Eber Jones, MD  sertraline (ZOLOFT) 100 MG tablet Take 100 mg by mouth daily.   Yes [provider]  sucralfate (CARAFATE) 1 g tablet TAKE ONE TABLET BY MOUTH 4 TIMES DAILY 03/12/16  Yes Annitta Needs, NP  timolol (TIMOPTIC) 0.5 % ophthalmic solution Place 1 drop into both eyes 2 (two) times daily.   Yes [provider]  doxycycline (VIBRA-TABS) 100 MG tablet Take 1 tablet (100 mg total) by mouth 2 (two) times daily. Patient not taking: Reported on 07/15/2017 05/25/17   Aurora Callas, NP  gabapentin (NEURONTIN) 600 MG  tablet Take 600 mg by mouth 3 (three) times daily with meals as needed.    [provider]    Current Facility-Administered Medications  Medication Dose Route Frequency Provider Last Rate Last Dose  .  stroke: mapping our early stages of recovery book   Does not apply Once Johnson, Clanford L, MD      . 0.9 %  sodium chloride infusion   Intravenous Continuous Johnson, Clanford L, MD      . amLODipine (NORVASC) tablet 5 mg  5 mg Oral Daily Johnson, Clanford L, MD   5 mg at 07/17/17 0935  . atorvastatin (LIPITOR) tablet 10 mg  10 mg Oral Daily Johnson, Clanford L, MD   10 mg at 07/17/17 0935  . aztreonam (AZACTAM) 2 g in dextrose 5 % 50 mL IVPB  2 g Intravenous Q8H Johnson, Clanford L, MD   Stopped at 07/17/17 1047  . brimonidine (ALPHAGAN) 0.15 % ophthalmic solution 1 drop  1 drop Both Eyes  BID Reubin Milan, MD   1 drop at 07/17/17 [id number]  . clopidogrel (PLAVIX) tablet 75 mg  75 mg Oral Daily Johnson, Clanford L, MD      . dicyclomine (BENTYL) capsule 20 mg  20 mg Oral TID AC & HS Johnson, Clanford L, MD   20 mg at 07/17/17 0919  . enoxaparin (LOVENOX) injection 70 mg  70 mg Subcutaneous Q24H Reubin Milan, MD   70 mg at 07/17/17 0954  . gabapentin (NEURONTIN) capsule 300 mg  300 mg Oral TID WC PRN Wynetta Emery, Clanford L, MD   300 mg at 07/17/17 6599  . hydrALAZINE (APRESOLINE) injection 15 mg  15 mg Intravenous Q4H PRN Johnson, Clanford L, MD      . hydrocerin (EUCERIN) cream   Topical BID Johnson, Clanford L, MD      . insulin aspart (novoLOG) injection 0-20 Units  0-20 Units Subcutaneous Q4H Johnson, Clanford L, MD      . insulin aspart (novoLOG) injection 8 Units  8 Units Subcutaneous TID WC Johnson, Clanford L, MD      . insulin glargine (LANTUS) injection 25 Units  25 Units Subcutaneous QHS Wynetta Emery, Clanford L, MD   25 Units at 07/16/17 2145  . labetalol (NORMODYNE,TRANDATE) injection 10 mg  10 mg Intravenous Q2H PRN Johnson, Clanford L, MD   10 mg at 07/16/17 1637  .  levofloxacin (LEVAQUIN) IVPB 750 mg  750 mg Intravenous Q24H Murlean Iba, MD   Stopped at 07/16/17 2131  . LORazepam (ATIVAN) injection 1 mg  1 mg Intravenous Q4H PRN Wynetta Emery, Clanford L, MD   1 mg at 07/17/17 0914  . metoCLOPramide (REGLAN) tablet 10 mg  10 mg Oral Q6H PRN Johnson, Clanford L, MD      . metoprolol succinate (TOPROL-XL) 24 hr tablet 50 mg  50 mg Oral Daily Johnson, Clanford L, MD   50 mg at 07/17/17 0935  . ondansetron (ZOFRAN) injection 4 mg  4 mg Intravenous Q6H PRN Reubin Milan, MD   4 mg at 07/17/17 3570  . pantoprazole (PROTONIX) EC tablet 40 mg  40 mg Oral BID AC Johnson, Clanford L, MD   40 mg at 07/17/17 1779  . polyethylene glycol (MIRALAX / GLYCOLAX) packet 17 g  17 g Oral BID Wynetta Emery, Clanford L, MD   17 g at 07/16/17 2145  . potassium chloride SA (K-DUR,KLOR-CON) CR tablet 40 mEq  40 mEq Oral Once Johnson, Clanford L, MD      . senna (SENOKOT) tablet 17.2 mg  2 tablet Oral BID Johnson, Clanford L, MD   17.2 mg at 07/17/17 0935  . sertraline (ZOLOFT) tablet 100 mg  100 mg Oral Daily Johnson, Clanford L, MD   100 mg at 07/17/17 0935  . sucralfate (CARAFATE) tablet 1 g  1 g Oral QID Johnson, Clanford L, MD   1 g at 07/17/17 0935  . timolol (TIMOPTIC) 0.5 % ophthalmic solution 1 drop  1 drop Both Eyes BID Wynetta Emery, Clanford L, MD   1 drop at 07/17/17 0938  . vancomycin (VANCOCIN) IVPB 1000 mg/200 mL premix  1,000 mg Intravenous Q12H Wynetta Emery, Clanford L, MD   Stopped at 07/16/17 2245    Allergies as of 07/14/2017 - Review Complete 07/14/2017  Allergen Reaction Noted  . Donnatal [pb-hyoscy-atropine-scopol er] Anaphylaxis 07/12/2015  . Fish allergy Anaphylaxis, Hives, and Rash 01/14/2015  . Haldol [haloperidol lactate] Other (See Comments) 08/07/2015  . Lidocaine Anaphylaxis 07/12/2015  . Maalox [calcium carbonate antacid] Anaphylaxis 07/12/2015  .  Aspirin Hives and Itching 03/22/2009  . Bactrim [sulfamethoxazole-trimethoprim] Itching 12/06/2014  . Keflex  [cephalexin] Hives, Itching, and Nausea And Vomiting 06/22/2017  . Penicillins Hives and Itching 03/22/2009  . Phenergan [promethazine hcl] Nausea And Vomiting 10/27/2014    Family History  Problem Relation Age of Onset  . Ovarian cancer Mother   . Cervical cancer Sister   . Diabetes Sister   . Colon cancer Neg Hx     Social History   Socioeconomic History  . Marital status: Single    Spouse name: Not on file  . Number of children: Not on file  . Years of education: Not on file  . Highest education level: Not on file  Social Needs  . Financial resource strain: Not on file  . Food insecurity - worry: Not on file  . Food insecurity - inability: Not on file  . Transportation needs - medical: Not on file  . Transportation needs - non-medical: Not on file  Occupational History  . Not on file  Tobacco Use  . Smoking status: Never Smoker  . Smokeless tobacco: Never Used  Substance and Sexual Activity  . Alcohol use: No    Alcohol/week: 0.0 oz  . Drug use: No  . Sexual activity: Yes  Other Topics Concern  . Not on file  Social History Narrative  . Not on file    Review of Systems: General: Negative for anorexia, weight loss, fever, chills, fatigue, weakness. Eyes: Noted blindness ENT: Negative for hoarseness, difficulty swallowing. CV: Negative for chest pain, angina, palpitations, peripheral edema.  Respiratory: Negative for dyspnea at rest, cough, sputum, wheezing.  GI: See history of present illness. Endo: Negative for unusual weight change.  Heme: Negative for bruising or bleeding. Allergy: Negative for rash or hives.  Physical Exam: Vital signs in last 24 hours: Temp:  [98 F (36.7 C)-99.8 F (37.7 C)] 99.8 F (37.7 C) (12/07 1131) Pulse Rate:  [85-96] 87 (12/07 1131) Resp:  [16-21] 16 (12/07 1131) BP: (126-199)/(68-99) 149/70 (12/07 1131) SpO2:  [97 %-100 %] 100 % (12/07 1131) Weight:  [299 lb 4.8 oz (135.8 kg)] 299 lb 4.8 oz (135.8 kg) (12/07 1031) Last  BM Date: 07/16/17 General:   Alert,  Well-developed, well-nourished, pleasant and cooperative in NAD Head:  Normocephalic and atraumatic. Eyes:  Sclera clear, no icterus. Conjunctiva pink. Ears:  Normal auditory acuity. Neck:  Supple; no masses or thyromegaly. Lungs:  Clear throughout to auscultation.  No wheezes, crackles, or rhonchi. No acute distress. Heart:  Regular rate and rhythm; no murmurs, clicks, rubs,  or gallops. Abdomen:  Soft, and nondistended. Moderate epigastric TTP. No masses, hepatosplenomegaly or hernias noted. Normal bowel sounds, without guarding, and without rebound.   Rectal:  Deferred.   Msk:  Symmetrical without gross deformities. Pulses:  Normal bilateral DP pulses noted. Extremities:  Without clubbing or edema. Neurologic:  Alert and  oriented x4;  grossly normal neurologically. Psych:  Alert and cooperative. Normal mood and affect.  Intake/Output from previous day: 12/06 0701 - 12/07 0700 In: 1093 [P.O.:175; I.V.:268; IV Piggyback:650] Out: 4193 [Urine:1550; Emesis/NG output:100] Intake/Output this shift: Total I/O In: 60 [P.O.:60] Out: -   Lab Results: Recent Labs    07/16/17 0526 07/17/17 0620 07/17/17 1053  WBC 9.7 10.8* 10.2  HGB 11.3* 10.4* 10.5*  HCT 33.4* 30.7* 31.0*  PLT 149* 147* 142*   BMET Recent Labs    07/16/17 0526 07/17/17 0620 07/17/17 1053  NA 136 135 134*  K 3.8 3.3* 3.3*  CL 104 103 103  CO2 24 25 24   GLUCOSE 267* 213* 245*  BUN 24* 28* 29*  CREATININE 1.68* 1.95* 1.95*  CALCIUM 8.3* 8.2* 8.2*   LFT Recent Labs    07/14/17 1927  PROT 7.5  ALBUMIN 3.6  AST 21  ALT 18  ALKPHOS 66  BILITOT 0.7   PT/INR Recent Labs    07/15/17 0620 07/17/17 1053  LABPROT 14.0 15.2  INR 1.09 1.21   Hepatitis Panel No results for input(s): HEPBSAG, HCVAB, HEPAIGM, HEPBIGM in the last 72 hours. C-Diff No results for input(s): CDIFFTOX in the last 72 hours.  Studies/Results: Ct Head Code Stroke Wo Contrast  Result  Date: 07/17/2017 CLINICAL DATA:  Code stroke.  Ataxia.  Code stroke. EXAM: CT HEAD WITHOUT CONTRAST TECHNIQUE: Contiguous axial images were obtained from the base of the skull through the vertex without intravenous contrast. COMPARISON:  07/14/2017 FINDINGS: Brain: Normal appearance without evidence of atrophy, old or acute infarction, mass lesion, hemorrhage, hydrocephalus or extra-axial collection. Vascular: No abnormal vascular finding. Skull: Normal Sinuses/Orbits: Clear/normal. The patient does have fluid in the mastoid air cells on each side. Other: None ASPECTS ([place] Stroke Program Early CT Score) - Ganglionic level infarction (caudate, lentiform nuclei, internal capsule, insula, M1-M3 cortex): 7 - Supraganglionic infarction (M4-M6 cortex): 3 Total score (0-10 with 10 being normal): 10 IMPRESSION: 1. Normal appearance of the brain itself. 2. ASPECTS is 10. Bilateral mastoid effusions. These results were called by telephone at the time of interpretation on 07/17/2017 at 11:00 am to Dr. Irwin Brakeman , who verbally acknowledged these results. Electronically Signed   By: Nelson Chimes M.D.   On: 07/17/2017 11:03    Impression: Patient presented for other medical issues including surgery for incision and drainage of his foot wound.  He developed an complaint of abdominal pain which is intermittent and lasting about an hour described as a B, knotting type pain.  He does have persistent GERD symptoms.  His nausea and vomiting restarted 2 days prior to admission.  He has a history of gastroparesis.  Possible exacerbation of gastroparesis at this time.  He appeared to be quite anxious when I was in there seeing him.  At this point we will get Reglan scheduled, scheduled Bentyl, PPI twice a day.  Plan: 1. Bentyl Reglan 3 times a day 2. Protonix twice daily 3. Bentyl 3 times a day 4. Scheduled antiemetics. 5. Supportive measures   Thank you for allowing [place] to participate in the care of Timothy Clark  Walden Field, DNP, AGNP-C Adult & Gerontological Nurse Practitioner [facility]    LOS: 3 days     07/17/2017, 1:50 PM

## 2017-07-17 NOTE — Progress Notes (Signed)
Code Stroke Time-   Call time 1030 Beeper time 1035 Exam started 1039 Exam finished 1041 Images sent [id number] Exam competed 1048 GR called 1047  Issues getting order put in

## 2017-07-17 NOTE — Progress Notes (Signed)
*  PRELIMINARY RESULTS* Echocardiogram 2D Echocardiogram has been performed.  Timothy Clark 07/17/2017, 4:16 PM

## 2017-07-17 NOTE — Consult Note (Signed)
[name], MD     [email or web address]          Timothy Clark is an 46 y.o. male.   ASSESSMENT/PLAN: 1. Recurrent episodes of convulsion and sustaining etiology unclear: Differential diagnosis includes seizures, psychosomatic disorders, medication effect, GERD and ischemia although ischemia is less likely given normal imaging. We will follow the EEG.    2. Likely severe obstructive sleep apnea syndrome that is untreated: The patient be given an auto Pap during this hospitalization but should have appropriate testing as soon as possible. This likely can be arranged on discharge.  3. Morbid obesity:  4. 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 Diabetic foot ulcer:  6. Panic attacks: Will consider SSRI and possibly low-dose benzodiazepine.     The patient is a 46 year old black male who presented to the hospital with abdominal pain and chest pain. It appear that the coin workup is that his GI symptoms and chest pain are likely due to severe GERD and diabetic gastroparesis. The patient was also noted to have diabetic foot ulcer on the right. He has been getting treatment outpatient and even for this condition. The patient had an event earlier this morning where he became unresponsive and was shaking. It appeared this was followed by focal weakness involving the left facial region and weakness of the left upper extremity which lasted for a few minutes. He has had imaging below which has been negative. He's never had a stroke in the past. Patient had another event while we were getting ready to see the patient. A medical alert was initiated. Again the nurse reports that the patient became initially unresponsive but was shaking particularly involving lower extremities and he was stiff and rigid involving the upper extremities. He did eventually regained consciousness and was responsive after sternal rub. Interestingly however he probably continue to have  shaking of the legs while he regained consciousness. The patient tells me that he has had these spells and that they're due to anxiety attack. The patient was seen dysarthria event and reports that he has had these events in the past. ROS: pos for snoring.    GENERAL: This is a massively obese male who is in some discomfort but appears to be recovering from the event that he just had.  HEENT: He has a large neck size approximately 22 inch. Tongue is also large and he has circumferential crowding of the posterior space.  ABDOMEN: soft  EXTREMITIES: There is significant pitting edema of the feet and ankles bilaterally. The right foot is in a bandage.  BACK: This is normal.  SKIN: Normal by inspection.    MENTAL STATUS:   CRANIAL NERVES: There appears to be a right exotropia; there is mild gaze evoke nystagmus especially on engage in horizontal movements: Pupils are reactive; vision or poor on the right and also limited on the left ; upper and lower facial muscles are normal in strength and symmetric, there is no flattening of the nasolabial folds; tongue is midline; uvula is midline; shoulder elevation is normal.  MOTOR: He has 4/5 strength of the upper lower extremities. Bulk and tone are normal.  COORDINATION: Left finger to nose is normal, right finger to nose is normal, No rest tremor; no intention tremor; no postural tremor; no bradykinesia.  REFLEXES: Deep tendon reflexes are symmetrical and normal.  SENSATION: Normal to light touch, temperature, and pinprick.      Blood pressure (!) 155/92, pulse 80, temperature 98.3 F (36.8 C),  temperature source Oral, resp. rate 16, height 6' (1.829 m), weight 299 lb 4.8 oz (135.8 kg), SpO2 100 %.  Past Medical History:  Diagnosis Date  . Chronic abdominal pain   . Esophagitis   . Eye hemorrhage   . Gastritis   . Gastroparesis   . GERD (gastroesophageal reflux disease)   . Glaucoma   . Helicobacter pylori (H. pylori) infection 2016     ERADICATION DOCUMENTED VIA STOOL TEST in AUG 2016 at [facility]  . Hiccups   . Hypertension   . Nausea and vomiting    chronic, recurrent  . Type 2 diabetes mellitus with complications (HCC)    diagnosed around age 72    Past Surgical History:  Procedure Laterality Date  . CATARACT EXTRACTION W/PHACO Left 05/19/2016   Procedure: CATARACT EXTRACTION PHACO AND INTRAOCULAR LENS PLACEMENT LEFT EYE;  Surgeon: Tonny Branch, MD;  Location: [facility];  Service: Ophthalmology;  Laterality: Left;  CDE: 7.30  . CATARACT EXTRACTION W/PHACO Right 06/23/2016   Procedure: CATARACT EXTRACTION PHACO AND INTRAOCULAR LENS PLACEMENT RIGHT EYE CDE=9.87;  Surgeon: Tonny Branch, MD;  Location: [facility];  Service: Ophthalmology;  Laterality: Right;  right  . ESOPHAGOGASTRODUODENOSCOPY  2015   Dr. Britta Mccreedy  . ESOPHAGOGASTRODUODENOSCOPY N/A 10/26/2014   RMR: Distal esophagititis-likely reflux related although an element of pill induced injuruy no exclueded  status post biopsy. Diffusely abnormal gastric mucosa of uncertain signigicane -status post gastric biopsy. Focal area of excoriation in the cardia most consistant with a trauma of heaving.   [name] EYE SURGERY  2015   stent placed to left eye  . EYE SURGERY    . INCISION AND DRAINAGE OF WOUND Right 07/16/2017   Procedure: IRRIGATION AND DEBRIDEMENT OF SOFT TISSUE OF ULCERATION RIGHT FOOT;  Surgeon: Caprice Beaver, DPM;  Location: [facility];  Service: Podiatry;  Laterality: Right;    Family History  Problem Relation Age of Onset  . Ovarian cancer Mother   . Cervical cancer Sister   . Diabetes Sister   . Colon cancer Neg Hx     Social History:  reports that  has never smoked. he has never used smokeless tobacco. He reports that he does not drink alcohol or use drugs.  Allergies:  Allergies  Allergen Reactions  . Donnatal [Pb-Hyoscy-Atropine-Scopol Er] Anaphylaxis    Reaction to GI cocktail  . Fish Allergy Anaphylaxis, Hives and Rash  . Haldol [Haloperidol Lactate]  Other (See Comments)    Chest Pain  . Lidocaine Anaphylaxis    Reaction to GI cocktail  . Maalox [Calcium Carbonate Antacid] Anaphylaxis    Reaction to GI cocktail  . Aspirin Hives and Itching  . Bactrim [Sulfamethoxazole-Trimethoprim] Itching  . Keflex [Cephalexin] Hives, Itching and Nausea And Vomiting  . Penicillins Hives and Itching    Has patient had a PCN reaction causing immediate rash, facial/tongue/throat swelling, SOB or lightheadedness with hypotension: yes Has patient had a PCN reaction causing severe rash involving mucus membranes or skin necrosis: yes Has patient had a PCN reaction that required hospitalization no Has patient had a PCN reaction occurring within the last 10 years: yes If all of the above answers are "NO", then may proceed with Cephalospor  . Phenergan [Promethazine Hcl] Nausea And Vomiting    Medications: Prior to Admission medications   Medication Sig Start Date End Date Taking? Authorizing Provider  atorvastatin (LIPITOR) 10 MG tablet Take 10 mg by mouth daily.   Yes [provider]  brimonidine (ALPHAGAN) 0.15 % ophthalmic solution Place  1 drop into both eyes 2 (two) times daily.    Yes [provider]  cetirizine (ZYRTEC) 10 MG tablet Take 10 mg by mouth daily.   Yes [provider]  clindamycin (CLEOCIN) 300 MG capsule Take 1 capsule (300 mg total) 3 (three) times daily by mouth. 06/22/17 07/22/17 Yes Dixon, Melton Krebs, NP  cyclobenzaprine (FLEXERIL) 10 MG tablet Take 10 mg by mouth at bedtime as needed for muscle spasms.    Yes [provider]  dicyclomine (BENTYL) 10 MG capsule Take 1 capsule (10 mg total) by mouth 4 (four) times daily -  before meals and at bedtime. Patient taking differently: Take 10 mg by mouth 2 (two) times daily.  12/26/16  Yes Annitta Needs, NP  hydrochlorothiazide (HYDRODIURIL) 25 MG tablet Take 25 mg by mouth daily.   Yes [provider]  insulin detemir (LEVEMIR) 100 UNIT/ML  injection Inject 10 Units into the skin at bedtime.    Yes [provider]  insulin NPH-regular Human (NOVOLIN 70/30) (70-30) 100 UNIT/ML injection Inject 35 Units into the skin 2 (two) times daily.   Yes [provider]  lisinopril (PRINIVIL,ZESTRIL) 40 MG tablet Take 40 mg by mouth daily.   Yes [provider]  LORazepam (ATIVAN) 0.5 MG tablet Take 0.5 mg by mouth at bedtime as needed for anxiety.   Yes [provider]  metoCLOPramide (REGLAN) 10 MG tablet Take 1 tablet (10 mg total) every 6 (six) hours as needed by mouth for nausea or refractory nausea / vomiting (nausea/headache). 06/16/17  Yes Eber Jones, MD  metoprolol succinate (TOPROL-XL) 50 MG 24 hr tablet Take 50 mg by mouth daily. Take with or immediately following a meal.   Yes [provider]  ondansetron (ZOFRAN) 4 MG tablet Take 1 tablet (4 mg total) every 6 (six) hours by mouth. 06/16/17  Yes Eber Jones, MD  pantoprazole (PROTONIX) 40 MG tablet TAKE 1 TABLET BY MOUTH TWICE DAILY BEFORE  A  MEAL 03/23/17  Yes Mahala Menghini, PA-C  potassium chloride SA (K-DUR,KLOR-CON) 20 MEQ tablet Take 2 tablets (40 mEq total) 2 (two) times daily by mouth. 06/16/17  Yes Eber Jones, MD  sertraline (ZOLOFT) 100 MG tablet Take 100 mg by mouth daily.   Yes [provider]  sucralfate (CARAFATE) 1 g tablet TAKE ONE TABLET BY MOUTH 4 TIMES DAILY 03/12/16  Yes Annitta Needs, NP  timolol (TIMOPTIC) 0.5 % ophthalmic solution Place 1 drop into both eyes 2 (two) times daily.   Yes [provider]  doxycycline (VIBRA-TABS) 100 MG tablet Take 1 tablet (100 mg total) by mouth 2 (two) times daily. Patient not taking: Reported on 07/15/2017 05/25/17   [place] Callas, NP  gabapentin (NEURONTIN) 600 MG tablet Take 600 mg by mouth 3 (three) times daily with meals as needed.    [provider]    Scheduled Meds: .  stroke: mapping our early stages of recovery book    Does not apply Once  . amLODipine  5 mg Oral Daily  . atorvastatin  10 mg Oral Daily  . brimonidine  1 drop Both Eyes BID  . clopidogrel  75 mg Oral Daily  . dicyclomine  20 mg Oral TID AC & HS  . enoxaparin (LOVENOX) injection  70 mg Subcutaneous Q24H  . hydrocerin   Topical BID  . insulin aspart  0-20 Units Subcutaneous Q4H  . insulin aspart  8 Units Subcutaneous TID WC  . insulin  glargine  25 Units Subcutaneous QHS  . metoCLOPramide  10 mg Oral TID AC  . metoprolol succinate  50 mg Oral Daily  . pantoprazole  40 mg Oral BID AC  . polyethylene glycol  17 g Oral BID  . potassium chloride  40 mEq Oral Once  . senna  2 tablet Oral BID  . sertraline  100 mg Oral Daily  . sucralfate  1 g Oral QID  . timolol  1 drop Both Eyes BID   Continuous Infusions: . sodium chloride    . aztreonam Stopped (07/17/17 1047)  . levofloxacin (LEVAQUIN) IV Stopped (07/16/17 2131)  . vancomycin Stopped (07/16/17 2245)   PRN Meds:.gabapentin, hydrALAZINE, labetalol, LORazepam, ondansetron (ZOFRAN) IV     Results for orders placed or performed during the hospital encounter of 07/14/17 (from the past 48 hour(s))  Glucose, capillary     Status: Abnormal   Collection Time: 07/15/17  8:39 PM  Result Value Ref Range   Glucose-Capillary 136 (H) 65 - 99 mg/dL  Basic metabolic panel     Status: Abnormal   Collection Time: 07/16/17  5:26 AM  Result Value Ref Range   Sodium 136 135 - 145 mmol/L   Potassium 3.8 3.5 - 5.1 mmol/L   Chloride 104 101 - 111 mmol/L   CO2 24 22 - 32 mmol/L   Glucose, Bld 267 (H) 65 - 99 mg/dL   BUN 24 (H) 6 - 20 mg/dL   Creatinine, Ser 1.68 (H) 0.61 - 1.24 mg/dL   Calcium 8.3 (L) 8.9 - 10.3 mg/dL   GFR calc non Af Amer 47 (L) >60 mL/min   GFR calc Af Amer 54 (L) >60 mL/min    Comment: (NOTE) The eGFR has been calculated using the CKD EPI equation. This calculation has not been validated in all clinical situations. eGFR's persistently <60 mL/min signify possible Chronic  Kidney Disease.    Anion gap 8 5 - 15  CBC WITH DIFFERENTIAL     Status: Abnormal   Collection Time: 07/16/17  5:26 AM  Result Value Ref Range   WBC 9.7 4.0 - 10.5 K/uL   RBC 3.64 (L) 4.22 - 5.81 MIL/uL   Hemoglobin 11.3 (L) 13.0 - 17.0 g/dL   HCT 33.4 (L) 39.0 - 52.0 %   MCV 91.8 78.0 - 100.0 fL   MCH 31.0 26.0 - 34.0 pg   MCHC 33.8 30.0 - 36.0 g/dL   RDW 12.9 11.5 - 15.5 %   Platelets 149 (L) 150 - 400 K/uL   Neutrophils Relative % 82 %   Neutro Abs 7.9 (H) 1.7 - 7.7 K/uL   Lymphocytes Relative 12 %   Lymphs Abs 1.2 0.7 - 4.0 K/uL   Monocytes Relative 6 %   Monocytes Absolute 0.6 0.1 - 1.0 K/uL   Eosinophils Relative 0 %   Eosinophils Absolute 0.0 0.0 - 0.7 K/uL   Basophils Relative 0 %   Basophils Absolute 0.0 0.0 - 0.1 K/uL  Glucose, capillary     Status: Abnormal   Collection Time: 07/16/17  8:06 AM  Result Value Ref Range   Glucose-Capillary 225 (H) 65 - 99 mg/dL   Comment 1 Notify RN    Comment 2 Document in Chart   Glucose, capillary     Status: Abnormal   Collection Time: 07/16/17 11:24 AM  Result Value Ref Range   Glucose-Capillary 182 (H) 65 - 99 mg/dL  Glucose, capillary     Status: Abnormal   Collection Time: 07/16/17  4:50 PM  Result Value Ref Range   Glucose-Capillary 247 (H) 65 - 99 mg/dL   Comment 1 Notify RN    Comment 2 Document in Chart   Glucose, capillary     Status: Abnormal   Collection Time: 07/16/17  9:36 PM  Result Value Ref Range   Glucose-Capillary 211 (H) 65 - 99 mg/dL   Comment 1 Notify RN    Comment 2 Document in Chart   Basic metabolic panel     Status: Abnormal   Collection Time: 07/17/17  6:20 AM  Result Value Ref Range   Sodium 135 135 - 145 mmol/L   Potassium 3.3 (L) 3.5 - 5.1 mmol/L   Chloride 103 101 - 111 mmol/L   CO2 25 22 - 32 mmol/L   Glucose, Bld 213 (H) 65 - 99 mg/dL   BUN 28 (H) 6 - 20 mg/dL   Creatinine, Ser 1.95 (H) 0.61 - 1.24 mg/dL   Calcium 8.2 (L) 8.9 - 10.3 mg/dL   GFR calc non Af Amer 39 (L) >60 mL/min    GFR calc Af Amer 45 (L) >60 mL/min    Comment: (NOTE) The eGFR has been calculated using the CKD EPI equation. This calculation has not been validated in all clinical situations. eGFR's persistently <60 mL/min signify possible Chronic Kidney Disease.    Anion gap 7 5 - 15  CBC WITH DIFFERENTIAL     Status: Abnormal   Collection Time: 07/17/17  6:20 AM  Result Value Ref Range   WBC 10.8 (H) 4.0 - 10.5 K/uL   RBC 3.35 (L) 4.22 - 5.81 MIL/uL   Hemoglobin 10.4 (L) 13.0 - 17.0 g/dL   HCT 30.7 (L) 39.0 - 52.0 %   MCV 91.6 78.0 - 100.0 fL   MCH 31.0 26.0 - 34.0 pg   MCHC 33.9 30.0 - 36.0 g/dL   RDW 13.1 11.5 - 15.5 %   Platelets 147 (L) 150 - 400 K/uL   Neutrophils Relative % 83 %   Neutro Abs 8.9 (H) 1.7 - 7.7 K/uL   Lymphocytes Relative 11 %   Lymphs Abs 1.2 0.7 - 4.0 K/uL   Monocytes Relative 6 %   Monocytes Absolute 0.7 0.1 - 1.0 K/uL   Eosinophils Relative 0 %   Eosinophils Absolute 0.0 0.0 - 0.7 K/uL   Basophils Relative 0 %   Basophils Absolute 0.0 0.0 - 0.1 K/uL  Magnesium     Status: Abnormal   Collection Time: 07/17/17  6:20 AM  Result Value Ref Range   Magnesium 1.5 (L) 1.7 - 2.4 mg/dL  Glucose, capillary     Status: Abnormal   Collection Time: 07/17/17  7:42 AM  Result Value Ref Range   Glucose-Capillary 199 (H) 65 - 99 mg/dL   Comment 1 Notify RN    Comment 2 Document in Chart   Lipid panel     Status: Abnormal   Collection Time: 07/17/17 10:53 AM  Result Value Ref Range   Cholesterol 143 0 - 200 mg/dL   Triglycerides 140 <150 mg/dL   HDL 27 (L) >40 mg/dL   Total CHOL/HDL Ratio 5.3 RATIO   VLDL 28 0 - 40 mg/dL   LDL Cholesterol 88 0 - 99 mg/dL    Comment:        Total Cholesterol/HDL:CHD Risk Coronary Heart Disease Risk Table                     Men   Women  1/2  Average Risk   3.4   3.3  Average Risk       5.0   4.4  2 X Average Risk   9.6   7.1  3 X Average Risk  23.4   11.0        Use the calculated Patient Ratio above and the CHD Risk Table to  determine the patient's CHD Risk.        ATP III CLASSIFICATION (LDL):  <100     mg/dL   Optimal  100-129  mg/dL   Near or Above                    Optimal  130-159  mg/dL   Borderline  160-189  mg/dL   High  >190     mg/dL   Very High   CBC     Status: Abnormal   Collection Time: 07/17/17 10:53 AM  Result Value Ref Range   WBC 10.2 4.0 - 10.5 K/uL   RBC 3.40 (L) 4.22 - 5.81 MIL/uL   Hemoglobin 10.5 (L) 13.0 - 17.0 g/dL   HCT 31.0 (L) 39.0 - 52.0 %   MCV 91.2 78.0 - 100.0 fL   MCH 30.9 26.0 - 34.0 pg   MCHC 33.9 30.0 - 36.0 g/dL   RDW 13.0 11.5 - 15.5 %   Platelets 142 (L) 150 - 400 K/uL  Protime-INR     Status: None   Collection Time: 07/17/17 10:53 AM  Result Value Ref Range   Prothrombin Time 15.2 11.4 - 15.2 seconds   INR 5.09   Basic metabolic panel     Status: Abnormal   Collection Time: 07/17/17 10:53 AM  Result Value Ref Range   Sodium 134 (L) 135 - 145 mmol/L   Potassium 3.3 (L) 3.5 - 5.1 mmol/L   Chloride 103 101 - 111 mmol/L   CO2 24 22 - 32 mmol/L   Glucose, Bld 245 (H) 65 - 99 mg/dL   BUN 29 (H) 6 - 20 mg/dL   Creatinine, Ser 1.95 (H) 0.61 - 1.24 mg/dL   Calcium 8.2 (L) 8.9 - 10.3 mg/dL   GFR calc non Af Amer 39 (L) >60 mL/min   GFR calc Af Amer 45 (L) >60 mL/min    Comment: (NOTE) The eGFR has been calculated using the CKD EPI equation. This calculation has not been validated in all clinical situations. eGFR's persistently <60 mL/min signify possible Chronic Kidney Disease.    Anion gap 7 5 - 15  APTT     Status: None   Collection Time: 07/17/17 10:53 AM  Result Value Ref Range   aPTT 33 24 - 36 seconds  Troponin I     Status: None   Collection Time: 07/17/17 10:53 AM  Result Value Ref Range   Troponin I <0.03 <0.03 ng/mL  Glucose, capillary     Status: Abnormal   Collection Time: 07/17/17 11:12 AM  Result Value Ref Range   Glucose-Capillary 231 (H) 65 - 99 mg/dL  Glucose, capillary     Status: Abnormal   Collection Time: 07/17/17  2:44 PM    Result Value Ref Range   Glucose-Capillary 197 (H) 65 - 99 mg/dL   Comment 1 Notify RN    Comment 2 Document in Chart     Studies/Results:  BRAIN MRI MRA FINDINGS: MRI HEAD FINDINGS  Brain: Cerebral volume within normal limits for patient age. No focal parenchymal signal abnormality identified. No abnormal foci of restricted diffusion to  suggest acute or subacute ischemia. Gray-white matter differentiation well maintained. No encephalomalacia to suggest chronic infarction. No foci of susceptibility artifact to suggest acute or chronic intracranial hemorrhage.  No mass lesion, midline shift or mass effect. No hydrocephalus. No extra-axial fluid collection. Major dural sinuses are grossly patent.  Pituitary gland and suprasellar region are normal. Midline structures intact and normal.  Vascular: Major intracranial vascular flow voids well maintained and normal in appearance.  Skull and upper cervical spine: Craniocervical junction normal. Visualized upper cervical spine within normal limits. Bone marrow signal intensity normal. No scalp soft tissue abnormality.  Sinuses/Orbits: Globes and orbital soft tissues demonstrate no acute abnormality. 2 cm ovoid cystic lesion at the superolateral aspect of the left globe, indeterminate, but stable relative to multiple previous exams dating back to 2016. Patient status post lens extraction bilaterally. Paranasal sinuses are clear. Bilateral mastoid effusions noted. Inner ear structures normal.  Other: None.  MRA HEAD FINDINGS  ANTERIOR CIRCULATION:  Internal carotid artery is widely patent to the level of the termini without stenosis. A1 segments widely patent. Normal anterior communicating artery. Anterior cerebral arteries widely patent to their distal aspects without stenosis. M1 segments patent without stenosis or occlusion. Right M1 segment bifurcates early. Distal MCA branches well perfused and  symmetric.  POSTERIOR CIRCULATION:  Vertebral arteries widely patent to the vertebrobasilar junction. Right vertebral artery slightly dominant. Short fenestration noted at the proximal basilar artery. Basilar artery widely patent to its distal aspect. Superior cerebral arteries patent bilaterally. Posterior cerebral arteries largely supplied via the basilar and are well perfused to their distal aspects. Small bilateral posterior communicating arteries noted.  No aneurysm.  IMPRESSION: MRI HEAD IMPRESSION:  1. Normal brain MRI.  No acute intracranial abnormality identified. 2. Bilateral mastoid effusions.  MRA HEAD IMPRESSION:  Normal intracranial MRA.    CAROTID DOPPLERS IMPRESSION: Minor carotid atherosclerosis. No hemodynamically significant ICA stenosis. Degree of narrowing less than 50% bilaterally by ultrasound criteria.  Patent antegrade vertebral flow bilaterally   ECHO Study Conclusions  - Left ventricle: The cavity size was normal. Systolic function was   normal. The estimated ejection fraction was in the range of 60%   to 65%. Wall motion was normal; there were no regional wall   motion abnormalities. Features are consistent with a pseudonormal   left ventricular filling pattern, with concomitant abnormal   relaxation and increased filling pressure (grade 2 diastolic   dysfunction). Doppler parameters are consistent with high   ventricular filling pressure. Mild concentric and moderate focal   basal septal hypertrophy. - Aortic valve: Trileaflet; mildly thickened, mildly calcified   leaflets. - Mitral valve: Mildly thickened leaflets .     Brain MRI is reviewed in person. No acute lesions are seen on DWI. FLAIR imaging shows no white matter lesions. There is however mild global atrophy for age. No hemorrhages appreciated.      Milbern Doescher A. Merlene Laughter, M.D.  Diplomate, Tax adviser of Psychiatry and Neurology ( Neurology). 07/17/2017, 4:36 PM

## 2017-07-17 NOTE — Progress Notes (Signed)
EEG Completed; Results Pending  

## 2017-07-17 NOTE — Progress Notes (Signed)
**Note De-Identified  Obfuscation** EKG complete; reported to MD and placed in patient chart

## 2017-07-17 NOTE — Anesthesia Postprocedure Evaluation (Signed)
Anesthesia Post Note  Patient: Timothy Clark  Procedure(s) Performed: IRRIGATION AND DEBRIDEMENT OF SOFT TISSUE OF ULCERATION RIGHT FOOT (Right Foot)  Patient location during evaluation: Nursing Unit Anesthesia Type: MAC Level of consciousness: awake and alert Pain management: satisfactory to patient Vital Signs Assessment: post-procedure vital signs reviewed and stable Respiratory status: spontaneous breathing Cardiovascular status: stable Postop Assessment: no apparent nausea or vomiting Anesthetic complications: no     Last Vitals:  Vitals:   07/16/17 2132 07/17/17 0641  BP: (!) 147/77 138/68  Pulse: 87 89  Resp: 16 16  Temp: 37.1 C 36.7 C  SpO2: 97% 98%    Last Pain:  Vitals:   07/17/17 0641  TempSrc: Oral  PainSc:                  Drucie Opitz

## 2017-07-17 NOTE — Consult Note (Signed)
[facility] Tele[facility] Consult Services   Asked to see this patient in telemedicine consultation. Consultation was performed with assistance of ancillary / medical staff at bedside. ?   Verbal consent to perform the examination with telemedicine was obtained. Patient and family agreed to proceed with the consultation.   Impression: L side weakness  Fairly acute onset L side weakness that is mild  NIHSS 4 due to LU/LE drift, ? Slurred speech, and sensory changes on right side (poorly localizing)  This is all occurring in setting of admission for sepsis related to diabetic foot ulcer and cellulitis, POD 1 for wound debridement  This raises concern that if this is stroke, endocarditis is a possible mechanis  Not a tpa candidate due to:  Concern for endocarditis as mechanism for stroke currently  Not an NIR candidate due to:  No LVO pattern of symptoms   Differential Diagnosis:   Stroke  TIA  Seizure  Migraine   Metrics:  Arrival time:  inpatient  [facility] contacted:  1610  [facility] at bedside:  1109  NIHSS assessment time:  1112  Recommendations:    Antiplatelet therapy with ASA OK  DVT Prophylaxis  Dysphagia Screen  Inpatient diagnostic testing to consider includes:  MRI brain, defer to inpatient neurology consultation for other testing  Discussed with attending physician     CC:  Stroke Alert  Date of Consult:  07/17/17    HPI:  Admitted 96/0 for complications related to R foot diabetic foot ulcer  Concern for sepsis related to this process, and osteomyelitis identified on work-up  POD 1 for wound debridement  This morning at about 1020 developed L side weakness  Some slurred speech, unclear about vision changes due to chronic low vision at basaeline due to glaucoma and past hemorrhage in eye (no vision OD, poor vision OS)  Stroke alert activated and call to teleneuro at 1103  eval at bedside via telemed at  1112  NIHSS 4  No tPA given the concern for endocarditis risk with concurrent sepsis/osteomyelitis  No intervention as not an LVO pattern of symptoms   Past Medical History:  Diagnosis Date  . Chronic abdominal pain   . Esophagitis   . Eye hemorrhage   . Gastritis   . Gastroparesis   . GERD (gastroesophageal reflux disease)   . Glaucoma   . Helicobacter pylori (H. pylori) infection 2016   ERADICATION DOCUMENTED VIA STOOL TEST in AUG 2016 at [place]  . Hiccups   . Hypertension   . Nausea and vomiting    chronic, recurrent  . Type 2 diabetes mellitus with complications (HCC)    diagnosed around age 67    Prior to Admission medications   Medication Sig Start Date End Date Taking? Authorizing Provider  atorvastatin (LIPITOR) 10 MG tablet Take 10 mg by mouth daily.   Yes [provider]  brimonidine (ALPHAGAN) 0.15 % ophthalmic solution Place 1 drop into both eyes 2 (two) times daily.    Yes [provider]  cetirizine (ZYRTEC) 10 MG tablet Take 10 mg by mouth daily.   Yes [provider]  clindamycin (CLEOCIN) 300 MG capsule Take 1 capsule (300 mg total) 3 (three) times daily by mouth. 06/22/17 07/22/17 Yes Dixon, Melton Krebs, NP  cyclobenzaprine (FLEXERIL) 10 MG tablet Take 10 mg by mouth at bedtime as needed for muscle spasms.    Yes [provider]  dicyclomine (BENTYL) 10 MG capsule Take 1 capsule (10 mg total) by mouth 4 (four) times  daily -  before meals and at bedtime. Patient taking differently: Take 10 mg by mouth 2 (two) times daily.  12/26/16  Yes Annitta Needs, NP  hydrochlorothiazide (HYDRODIURIL) 25 MG tablet Take 25 mg by mouth daily.   Yes [provider]  insulin detemir (LEVEMIR) 100 UNIT/ML injection Inject 10 Units into the skin at bedtime.    Yes [provider]  insulin NPH-regular Human (NOVOLIN 70/30) (70-30) 100 UNIT/ML injection Inject 35 Units into the skin 2 (two) times daily.   Yes [provider]  lisinopril (PRINIVIL,ZESTRIL) 40 MG tablet Take 40 mg by mouth daily.   Yes [provider]  LORazepam (ATIVAN) 0.5 MG tablet Take 0.5 mg by mouth at bedtime as needed for anxiety.   Yes [provider]  metoCLOPramide (REGLAN) 10 MG tablet Take 1 tablet (10 mg total) every 6 (six) hours as needed by mouth for nausea or refractory nausea / vomiting (nausea/headache). 06/16/17  Yes Eber Jones, MD  metoprolol succinate (TOPROL-XL) 50 MG 24 hr tablet Take 50 mg by mouth daily. Take with or immediately following a meal.   Yes [provider]  ondansetron (ZOFRAN) 4 MG tablet Take 1 tablet (4 mg total) every 6 (six) hours by mouth. 06/16/17  Yes Eber Jones, MD  pantoprazole (PROTONIX) 40 MG tablet TAKE 1 TABLET BY MOUTH TWICE DAILY BEFORE  A  MEAL 03/23/17  Yes Mahala Menghini, PA-C  potassium chloride SA (K-DUR,KLOR-CON) 20 MEQ tablet Take 2 tablets (40 mEq total) 2 (two) times daily by mouth. 06/16/17  Yes Eber Jones, MD  sertraline (ZOLOFT) 100 MG tablet Take 100 mg by mouth daily.   Yes [provider]  sucralfate (CARAFATE) 1 g tablet TAKE ONE TABLET BY MOUTH 4 TIMES DAILY 03/12/16  Yes Annitta Needs, NP  timolol (TIMOPTIC) 0.5 % ophthalmic solution Place 1 drop into both eyes 2 (two) times daily.   Yes [provider]  doxycycline (VIBRA-TABS) 100 MG tablet Take 1 tablet (100 mg total) by mouth 2 (two) times daily. Patient not taking: Reported on 07/15/2017 05/25/17   Carthage Callas, NP  gabapentin (NEURONTIN) 600 MG tablet Take 600 mg by mouth 3 (three) times daily with meals as needed.    [provider]     Allergies  Allergen Reactions  . Donnatal [Pb-Hyoscy-Atropine-Scopol Er] Anaphylaxis    Reaction to GI cocktail  . Fish Allergy Anaphylaxis, Hives and Rash  . Haldol [Haloperidol Lactate] Other (See Comments)    Chest Pain  . Lidocaine Anaphylaxis    Reaction to GI cocktail  . Maalox  [Calcium Carbonate Antacid] Anaphylaxis    Reaction to GI cocktail  . Aspirin Hives and Itching  . Bactrim [Sulfamethoxazole-Trimethoprim] Itching  . Keflex [Cephalexin] Hives, Itching and Nausea And Vomiting  . Penicillins Hives and Itching    Has patient had a PCN reaction causing immediate rash, facial/tongue/throat swelling, SOB or lightheadedness with hypotension: yes Has patient had a PCN reaction causing severe rash involving mucus membranes or skin necrosis: yes Has patient had a PCN reaction that required hospitalization no Has patient had a PCN reaction occurring within the last 10 years: yes If all of the above answers are "NO", then may proceed with Cephalospor  . Phenergan [Promethazine Hcl] Nausea And Vomiting    Social History   Tobacco Use  . Smoking status: Never Smoker  . Smokeless tobacco: Never Used  Substance Use Topics  . Alcohol use: No  Alcohol/week: 0.0 oz  . Drug use: No    Family History  Problem Relation Age of Onset  . Ovarian cancer Mother   . Cervical cancer Sister   . Diabetes Sister   . Colon cancer Neg Hx         Physical Exam  Vitals:   07/17/17 0935 07/17/17 1031  BP: (!) 194/99 (!) 165/81  Pulse: 96 92  Resp:    Temp:    SpO2:  97%     NIHSS  LOC - 0  LOC questions - 0  LOC commands - 0  EOM - 0  VF - 0  Face - 0  Motor Upper Ext - 1  Motor Lower Ext - 1  Coordination - 0  Sensory - 1  Language - 0  Speech - 1  Extinction - 0  NIHSS total - 4   Lab/Data Review  CT Head - reviewed - No acute process      Medical Decision Making:  - Extensive number of diagnosis or management options are considered above.   - Extensive amount of complex data reviewed.   - High risk of complication and/or morbidity or mortality are associated with differential diagnostic considerations above.  - There may be Uncertain outcome and increased probability of prolonged functional impairment or high probability of  severe prolonged functional impairment associated with some of these differential diagnosis.  Medical Data Reviewed:  1.Data reviewed include clinical labs, radiology,  Medical Tests;   2.Tests results discussed w/performing or interpreting physician;   3.Obtaining/reviewing old medical records;  4.Obtaining case history from another source;  5.Independent review of image, tracing or specimen.

## 2017-07-17 NOTE — Progress Notes (Signed)
Pt's BP elevated, provided PRN Hydralazine per order. Will continue to monitor.

## 2017-07-17 NOTE — Progress Notes (Signed)
Attempted to round on patient.  He is off floor for MRI.  I will see him tomorrow for dressing change.

## 2017-07-17 NOTE — Progress Notes (Signed)
Informed by nurse tech that Pt had change in LOC. Immediately went to room and observed that patient had slurred speech, left sided numbness and weakness. Code Stroke was called. MD EPaged. MD came to room immediately and assessed patient. Pt received 2nd IV access. Pt CBG 231.  Code stroke orders placed and pt taken to CT. MD accompanied pt to CT and returned to pt's room for Teleneurology consult. Teleneurology RN and MD assessed patient and stated that there is no TPA administration at this time. Pt's NIH was assessed by teleneurology MD. Received orders to continue with stroke protocol. Vital signs and neuro checks will be performed according to protocol. Family is in the room and have been made aware of pt's status.  Will continue to reassess patient throughout the shift.

## 2017-07-17 NOTE — Addendum Note (Signed)
Addendum  created 07/17/17 5750 by Vista Deck, CRNA   Sign clinical note

## 2017-07-17 NOTE — Evaluation (Signed)
Clinical/Bedside Swallow Evaluation Patient Details  Name: Timothy Clark MRN: [id number] Date of Birth: May 03, 1971  Today's Date: 07/17/2017 Time: SLP Start Time (ACUTE ONLY): 1750 SLP Stop Time (ACUTE ONLY): 1805 SLP Time Calculation (min) (ACUTE ONLY): 15 min  Past Medical History:  Past Medical History:  Diagnosis Date  . Chronic abdominal pain   . Esophagitis   . Eye hemorrhage   . Gastritis   . Gastroparesis   . GERD (gastroesophageal reflux disease)   . Glaucoma   . Helicobacter pylori (H. pylori) infection 2016   ERADICATION DOCUMENTED VIA STOOL TEST in AUG 2016 at [place]  . Hiccups   . Hypertension   . Nausea and vomiting    chronic, recurrent  . Type 2 diabetes mellitus with complications (HCC)    diagnosed around age 46   Past Surgical History:  Past Surgical History:  Procedure Laterality Date  . CATARACT EXTRACTION W/PHACO Left 05/19/2016   Procedure: CATARACT EXTRACTION PHACO AND INTRAOCULAR LENS PLACEMENT LEFT EYE;  Surgeon: Tonny Branch, MD;  Location: [facility];  Service: Ophthalmology;  Laterality: Left;  CDE: 7.30  . CATARACT EXTRACTION W/PHACO Right 06/23/2016   Procedure: CATARACT EXTRACTION PHACO AND INTRAOCULAR LENS PLACEMENT RIGHT EYE CDE=9.87;  Surgeon: Tonny Branch, MD;  Location: [facility];  Service: Ophthalmology;  Laterality: Right;  right  . ESOPHAGOGASTRODUODENOSCOPY  2015   Dr. Britta Mccreedy  . ESOPHAGOGASTRODUODENOSCOPY N/A 10/26/2014   RMR: Distal esophagititis-likely reflux related although an element of pill induced injuruy no exclueded  status post biopsy. Diffusely abnormal gastric mucosa of uncertain signigicane -status post gastric biopsy. Focal area of excoriation in the cardia most consistant with a trauma of heaving.   [name] EYE SURGERY  2015   stent placed to left eye  . EYE SURGERY    . INCISION AND DRAINAGE OF WOUND Right 07/16/2017   Procedure: IRRIGATION AND DEBRIDEMENT OF SOFT TISSUE OF ULCERATION RIGHT FOOT;  Surgeon: Caprice Beaver, DPM;   Location: [facility];  Service: Podiatry;  Laterality: Right;   HPI:  Pt is a 46 y.o. male with PMH of chronic abdominal pain, esophagitis, gastritis, gastroparesis, GERD, history of H. pylori infection, hiccups, recurring nausea and vomiting, hemorrhage, glaucoma, hypertension, hyperlipidemia, type 2 diabetesadmitted 12/4 with complaints of abdominal pain, burning chest pain, nausea and 10 or more episodes of emesis since this morning.The patient also complains that he feels he is having a panic attack and his gastroparesis is acting up. Also sepsis secondary to cellulitis and osteomyelitis of foot. Pt had surgery for foot on 12/6, later experienced vision loss, tremors, anxiety, L side weakness, some slurred speech. MRI negative. GI following for nausea; bedside swallow eval and speech-language eval ordered.   Assessment / Plan / Recommendation Clinical Impression  Pt without overt difficulty with sips of thin liquid. Immediate cough following trial of puree with pt stating "it feels like it's stuck" and pointing to neck/ chest. Followed this bolus with thin liquid and pt appeared more comfortable. Pt appeared to be experiencing significant anxiety during this evaluation, attempting to get out of bed and stating he needed to stand up because of his stomach pain. For these reasons this evaluation was limited. Given hx of reflux and esophagitis along with s/s at bedside, suspect esophageal-based dysphagia. Recommend initiating clear liquid diet with meds crushed in puree, follow up with sips of thin liquid if pt experiencing globus sensation. Full supervision, reflux precautions- sitting upright 30-60 minutes after meal. Would also recommend consideration of further esophageal assessment. Will continue  to follow for diet tolerance/ consider advancement.  SLP Visit Diagnosis: Dysphagia, unspecified (R13.10)    Aspiration Risk  Mild aspiration risk;Moderate aspiration risk    Diet Recommendation Thin liquid    Liquid Administration via: Cup;Straw Medication Administration: Crushed with puree(follow up with thin liquid as needed) Supervision: Staff to assist with self feeding;Full supervision/cueing for compensatory strategies Compensations: Slow rate;Small sips/bites Postural Changes: Seated upright at 90 degrees;Remain upright for at least 30 minutes after po intake    Other  Recommendations Recommended Consults: Consider esophageal assessment Oral Care Recommendations: Oral care BID Other Recommendations: Clarify dietary restrictions   Follow up Recommendations Other (comment)(TBD)      Frequency and Duration min 2x/week  1 week       Prognosis Prognosis for Safe Diet Advancement: (dependent on GI status)      Swallow Study   General HPI: Pt is a 46 y.o. male with PMH of chronic abdominal pain, esophagitis, gastritis, gastroparesis, GERD, history of H. pylori infection, hiccups, recurring nausea and vomiting, hemorrhage, glaucoma, hypertension, hyperlipidemia, type 2 diabetesadmitted 12/4 with complaints of abdominal pain, burning chest pain, nausea and 10 or more episodes of emesis since this morning.The patient also complains that he feels he is having a panic attack and his gastroparesis is acting up. Also sepsis secondary to cellulitis and osteomyelitis of foot. Pt had surgery for foot on 12/6, later experienced vision loss, tremors, anxiety, L side weakness, some slurred speech. MRI negative. GI following for nausea; bedside swallow eval and speech-language eval ordered. Type of Study: Bedside Swallow Evaluation Previous Swallow Assessment: none in chart Diet Prior to this Study: NPO Temperature Spikes Noted: (low grade) Respiratory Status: Nasal cannula History of Recent Intubation: No Behavior/Cognition: Impulsive;Other (Comment)(experiencing significant abdominal pain/ anxiety) Oral Cavity Assessment: Within Functional Limits Oral Cavity - Dentition: Missing  dentition Vision: Impaired for self-feeding Self-Feeding Abilities: Needs assist Patient Positioning: Partially reclined(due to pain) Baseline Vocal Quality: Normal    Oral/Motor/Sensory Function Overall Oral Motor/Sensory Function: Within functional limits   Ice Chips Ice chips: Not tested   Thin Liquid Thin Liquid: Within functional limits    Nectar Thick Nectar Thick Liquid: Not tested   Honey Thick Honey Thick Liquid: Not tested   Puree Puree: Impaired Presentation: Spoon Pharyngeal Phase Impairments: Cough - Immediate Other Comments: (globus sensation)   Solid   GO   Solid: Not tested        Kern Reap, MA, CCC-SLP 07/17/2017,6:21 PM

## 2017-07-17 NOTE — Progress Notes (Signed)
Informed by nurse tech who was positioned at patient's bedside that pt had slurred speech, numbness and weakness to left side. MD EPaged., Code Stroke called. Pt was taken to CT for CT with non contrast. Code stroke orders placed.  After CT pt taken to room immediately and Teleneurology was contacted. Informed Teleneurology nurse of pt's health status and Teleneurology MD assessed patient in room. MD was in room for entire duration of patient's assessment by teleneurology.

## 2017-07-17 NOTE — Progress Notes (Signed)
PT Cancellation Note  Patient Details Name: Timothy Clark MRN: [id number] DOB: 03-29-1971   Cancelled Treatment:    Reason Eval/Treat Not Completed: Patient's level of consciousness(Chart reviewed, RN consulted. Pt somewhat altered this morning, security in room encouraging patient to remain in bed. Pt has been somewhat impulsive and mildly aggitated this morning. Will hold PT eval until later date/time. )  10:20 AM, 07/17/17 [name], PT, DPT Physical Therapist - [place] [phone] (Office)    [name] 07/17/2017, 10:20 AM

## 2017-07-17 NOTE — Progress Notes (Signed)
Inpatient Diabetes Program Recommendations  AACE/ADA: New Consensus Statement on Inpatient Glycemic Control (2015)  Target Ranges:  Prepandial:   less than 140 mg/dL      Peak postprandial:   less than 180 mg/dL (1-2 hours)      Critically ill patients:  140 - 180 mg/dL   Results for Timothy Clark, [name] (MRN [id number]) as of 07/17/2017 08:58  Ref. Range 07/16/2017 08:06 07/16/2017 11:24 07/16/2017 16:50 07/16/2017 21:36 07/17/2017 07:42  Glucose-Capillary Latest Ref Range: 65 - 99 mg/dL 225 (H) 182 (H) 247 (H) 211 (H) 199 (H)   Review of Glycemic Control  Diabetes history: DM2 Outpatient Diabetes medications: Levemir 20 units QHS, Novolin 70/30 35 units bid Current orders for Inpatient glycemic control: Lantus 25 units QHS, Novolog 0-20 units tidwc + 8 units tidwc  Inpatient Diabetes Program Recommendations:  Glucose trends slightly improved as fasting glucose is 199 mg/dl this am. Meal coverage was not given yesterday only correction. Will follow trends and insulin adjustments today per Dr. Wynetta Emery. Could consider adding Novolog HS scale as glucose was >200 last night.  Thanks,  Tama Headings RN, MSN, [facility] Inpatient Diabetes Coordinator Team Pager [phone] (8a-5p)

## 2017-07-18 LAB — COMPREHENSIVE METABOLIC PANEL
ALT: 10 U/L — ABNORMAL LOW (ref 17–63)
AST: 17 U/L (ref 15–41)
Albumin: 2.6 g/dL — ABNORMAL LOW (ref 3.5–5.0)
Alkaline Phosphatase: 43 U/L (ref 38–126)
Anion gap: 7 (ref 5–15)
BUN: 23 mg/dL — ABNORMAL HIGH (ref 6–20)
CO2: 24 mmol/L (ref 22–32)
Calcium: 8.1 mg/dL — ABNORMAL LOW (ref 8.9–10.3)
Chloride: 106 mmol/L (ref 101–111)
Creatinine, Ser: 1.79 mg/dL — ABNORMAL HIGH (ref 0.61–1.24)
GFR calc Af Amer: 50 mL/min — ABNORMAL LOW (ref >60)
GFR calc non Af Amer: 43 mL/min — ABNORMAL LOW (ref >60)
Glucose, Bld: 139 mg/dL — ABNORMAL HIGH (ref 65–99)
Potassium: 3 mmol/L — ABNORMAL LOW (ref 3.5–5.1)
Sodium: 137 mmol/L (ref 135–145)
Total Bilirubin: 0.8 mg/dL (ref 0.3–1.2)
Total Protein: 5.8 g/dL — ABNORMAL LOW (ref 6.5–8.1)

## 2017-07-18 LAB — CBC WITH DIFFERENTIAL/PLATELET
Basophils Absolute: 0 10*3/uL (ref 0.0–0.1)
Basophils Relative: 0 %
Eosinophils Absolute: 0.1 10*3/uL (ref 0.0–0.7)
Eosinophils Relative: 1 %
HCT: 30.3 % — ABNORMAL LOW (ref 39.0–52.0)
Hemoglobin: 10.3 g/dL — ABNORMAL LOW (ref 13.0–17.0)
Lymphocytes Relative: 12 %
Lymphs Abs: 1.1 10*3/uL (ref 0.7–4.0)
MCH: 31 pg (ref 26.0–34.0)
MCHC: 34 g/dL (ref 30.0–36.0)
MCV: 91.3 fL (ref 78.0–100.0)
Monocytes Absolute: 0.7 10*3/uL (ref 0.1–1.0)
Monocytes Relative: 8 %
Neutro Abs: 6.9 10*3/uL (ref 1.7–7.7)
Neutrophils Relative %: 79 %
Platelets: 136 10*3/uL — ABNORMAL LOW (ref 150–400)
RBC: 3.32 MIL/uL — ABNORMAL LOW (ref 4.22–5.81)
RDW: 12.9 % (ref 11.5–15.5)
WBC: 8.8 10*3/uL (ref 4.0–10.5)

## 2017-07-18 LAB — GLUCOSE, CAPILLARY
Glucose-Capillary: 110 mg/dL — ABNORMAL HIGH (ref 65–99)
Glucose-Capillary: 133 mg/dL — ABNORMAL HIGH (ref 65–99)
Glucose-Capillary: 114 mg/dL — ABNORMAL HIGH (ref 65–99)
Glucose-Capillary: 129 mg/dL — ABNORMAL HIGH (ref 65–99)

## 2017-07-18 LAB — LIPID PANEL
Cholesterol: 132 mg/dL (ref 0–200)
HDL: 24 mg/dL — ABNORMAL LOW (ref >40)
LDL Cholesterol: 72 mg/dL (ref 0–99)
Total CHOL/HDL Ratio: 5.5 RATIO
Triglycerides: 178 mg/dL — ABNORMAL HIGH (ref <150)
VLDL: 36 mg/dL (ref 0–40)

## 2017-07-18 LAB — MAGNESIUM: Magnesium: 1.7 mg/dL (ref 1.7–2.4)

## 2017-07-18 MED ORDER — HYDROCHLOROTHIAZIDE 12.5 MG PO TABS: 12.5000 mg | ORAL_TABLET | Freq: Every day | ORAL | 0 refills | Status: DC

## 2017-07-18 MED ORDER — POTASSIUM CHLORIDE CRYS ER 20 MEQ PO TBCR
60.0000 meq | EXTENDED_RELEASE_TABLET | Freq: Once | ORAL | Status: AC
  Administered 2017-07-18: 60 meq via ORAL
  Filled 2017-07-18: qty 3

## 2017-07-18 MED ORDER — AMLODIPINE BESYLATE 5 MG PO TABS: 5.0000 mg | ORAL_TABLET | Freq: Every day | ORAL | 0 refills | Status: DC

## 2017-07-18 MED ORDER — LEVOFLOXACIN 500 MG PO TABS: 500.0000 mg | ORAL_TABLET | Freq: Every day | ORAL | 0 refills | Status: AC

## 2017-07-18 MED ORDER — QUETIAPINE FUMARATE 25 MG PO TABS
50.0000 mg | ORAL_TABLET | Freq: Two times a day (BID) | ORAL | Status: DC
  Administered 2017-07-18: 50 mg via ORAL
  Filled 2017-07-18: qty 2

## 2017-07-18 NOTE — Progress Notes (Signed)
Patient voices no complaints. Wants to go home. Events over the past 24 hours noted.   Vital signs in last 24 hours: Temp:  [97.4 F (36.3 C)-99.7 F (37.6 C)] 98.4 F (36.9 C) (12/08 0930) Pulse Rate:  [66-93] 86 (12/08 0930) Resp:  [18-24] 18 (12/08 0930) BP: (120-183)/(59-107) 130/69 (12/08 0930) SpO2:  [97 %-100 %] 98 % (12/08 0930) Last BM Date: 07/17/17 General: Awake. Pleasant. Appears no acute distress.   Abdomen:  Nondistended. Positive bowel sounds. No succussion splash. Abdomen is soft and nontender   Extremities:  Without clubbing or edema.    Intake/Output from previous day: 12/07 0701 - 12/08 0700 In: 983.3 [P.O.:60; I.V.:423.3; IV Piggyback:500] Out: 1100 [Urine:1100] Intake/Output this shift: Total I/O In: 400 [I.V.:350; IV Piggyback:50] Out: -   Lab Results: Recent Labs    07/17/17 0620 07/17/17 1053 07/18/17 0815  WBC 10.8* 10.2 8.8  HGB 10.4* 10.5* 10.3*  HCT 30.7* 31.0* 30.3*  PLT 147* 142* 136*   BMET Recent Labs    07/17/17 0620 07/17/17 1053 07/18/17 0815  NA 135 134* 137  K 3.3* 3.3* 3.0*  CL 103 103 106  CO2 25 24 24   GLUCOSE 213* 245* 139*  BUN 28* 29* 23*  CREATININE 1.95* 1.95* 1.79*  CALCIUM 8.2* 8.2* 8.1*   LFT Recent Labs    07/18/17 0815  PROT 5.8*  ALBUMIN 2.6*  AST 17  ALT 10*  ALKPHOS 43  BILITOT 0.8   PT/INR Recent Labs    07/17/17 1053  LABPROT 15.2  INR 1.21   Hepatitis Panel No results for input(s): HEPBSAG, HCVAB, HEPAIGM, HEPBIGM in the last 72 hours. C-Diff No results for input(s): CDIFFTOX in the last 72 hours.  Studies/Results: Mr Brain Wo Contrast  Result Date: 07/17/2017 CLINICAL DATA:  Initial evaluation for acute confusion, and aphasia. EXAM: MRI HEAD WITHOUT CONTRAST MRA HEAD WITHOUT CONTRAST TECHNIQUE: Multiplanar, multiecho pulse sequences of the brain and surrounding structures were obtained without intravenous contrast. Angiographic images of the head were obtained using MRA technique  without contrast. COMPARISON:  Prior CT from earlier the same day. FINDINGS: MRI HEAD FINDINGS Brain: Cerebral volume within normal limits for patient age. No focal parenchymal signal abnormality identified. No abnormal foci of restricted diffusion to suggest acute or subacute ischemia. Gray-white matter differentiation well maintained. No encephalomalacia to suggest chronic infarction. No foci of susceptibility artifact to suggest acute or chronic intracranial hemorrhage. No mass lesion, midline shift or mass effect. No hydrocephalus. No extra-axial fluid collection. Major dural sinuses are grossly patent. Pituitary gland and suprasellar region are normal. Midline structures intact and normal. Vascular: Major intracranial vascular flow voids well maintained and normal in appearance. Skull and upper cervical spine: Craniocervical junction normal. Visualized upper cervical spine within normal limits. Bone marrow signal intensity normal. No scalp soft tissue abnormality. Sinuses/Orbits: Globes and orbital soft tissues demonstrate no acute abnormality. 2 cm ovoid cystic lesion at the superolateral aspect of the left globe, indeterminate, but stable relative to multiple previous exams dating back to 2016. Patient status post lens extraction bilaterally. Paranasal sinuses are clear. Bilateral mastoid effusions noted. Inner ear structures normal. Other: None. MRA HEAD FINDINGS ANTERIOR CIRCULATION: Internal carotid artery is widely patent to the level of the termini without stenosis. A1 segments widely patent. Normal anterior communicating artery. Anterior cerebral arteries widely patent to their distal aspects without stenosis. M1 segments patent without stenosis or occlusion. Right M1 segment bifurcates early. Distal MCA branches well perfused and symmetric. POSTERIOR CIRCULATION: Vertebral arteries  widely patent to the vertebrobasilar junction. Right vertebral artery slightly dominant. Short fenestration noted at the  proximal basilar artery. Basilar artery widely patent to its distal aspect. Superior cerebral arteries patent bilaterally. Posterior cerebral arteries largely supplied via the basilar and are well perfused to their distal aspects. Small bilateral posterior communicating arteries noted. No aneurysm. IMPRESSION: MRI HEAD IMPRESSION: 1. Normal brain MRI.  No acute intracranial abnormality identified. 2. Bilateral mastoid effusions. MRA HEAD IMPRESSION: Normal intracranial MRA. Electronically Signed   By: Jeannine Boga M.D.   On: 07/17/2017 13:49   US Carotid Bilateral (at [facility] And [facility] Only)  Result Date: 07/17/2017 CLINICAL DATA:  Stroke symptoms, ataxia, hypertension, visual disturbance, diabetes EXAM: BILATERAL CAROTID DUPLEX ULTRASOUND TECHNIQUE: Pearline Cables scale imaging, color Doppler and duplex ultrasound were performed of bilateral carotid and vertebral arteries in the neck. COMPARISON:  None avail FINDINGS: Criteria: Quantification of carotid stenosis is based on velocity parameters that correlate the residual internal carotid diameter with NASCET-based stenosis levels, using the diameter of the distal internal carotid lumen as the denominator for stenosis measurement. The following velocity measurements were obtained: RIGHT ICA:  80/21 cm/sec CCA:  401/02 cm/sec SYSTOLIC ICA/CCA RATIO:  0.8 DIASTOLIC ICA/CCA RATIO:  0.9 ECA:  122 cm/sec LEFT ICA:  117/25 cm/sec CCA:  72/53 cm/sec SYSTOLIC ICA/CCA RATIO:  1.2 DIASTOLIC ICA/CCA RATIO:  1.0 ECA:  160 cm/sec RIGHT CAROTID ARTERY: Minor echogenic shadowing plaque formation. No hemodynamically significant right ICA stenosis, velocity elevation, or turbulent flow. Degree of narrowing less than 50%. RIGHT VERTEBRAL ARTERY:  Antegrade LEFT CAROTID ARTERY: Similar scattered minor echogenic plaque formation. No hemodynamically significant left ICA stenosis, velocity elevation, or turbulent flow. LEFT VERTEBRAL ARTERY:  Antegrade IMPRESSION: Minor carotid  atherosclerosis. No hemodynamically significant ICA stenosis. Degree of narrowing less than 50% bilaterally by ultrasound criteria. Patent antegrade vertebral flow bilaterally Electronically Signed   By: Jerilynn Mages.  Shick M.D.   On: 07/17/2017 14:16   Mr Jodene Nam Head Wo Contrast  Result Date: 07/17/2017 CLINICAL DATA:  Initial evaluation for acute confusion, and aphasia. EXAM: MRI HEAD WITHOUT CONTRAST MRA HEAD WITHOUT CONTRAST TECHNIQUE: Multiplanar, multiecho pulse sequences of the brain and surrounding structures were obtained without intravenous contrast. Angiographic images of the head were obtained using MRA technique without contrast. COMPARISON:  Prior CT from earlier the same day. FINDINGS: MRI HEAD FINDINGS Brain: Cerebral volume within normal limits for patient age. No focal parenchymal signal abnormality identified. No abnormal foci of restricted diffusion to suggest acute or subacute ischemia. Gray-white matter differentiation well maintained. No encephalomalacia to suggest chronic infarction. No foci of susceptibility artifact to suggest acute or chronic intracranial hemorrhage. No mass lesion, midline shift or mass effect. No hydrocephalus. No extra-axial fluid collection. Major dural sinuses are grossly patent. Pituitary gland and suprasellar region are normal. Midline structures intact and normal. Vascular: Major intracranial vascular flow voids well maintained and normal in appearance. Skull and upper cervical spine: Craniocervical junction normal. Visualized upper cervical spine within normal limits. Bone marrow signal intensity normal. No scalp soft tissue abnormality. Sinuses/Orbits: Globes and orbital soft tissues demonstrate no acute abnormality. 2 cm ovoid cystic lesion at the superolateral aspect of the left globe, indeterminate, but stable relative to multiple previous exams dating back to 2016. Patient status post lens extraction bilaterally. Paranasal sinuses are clear. Bilateral mastoid  effusions noted. Inner ear structures normal. Other: None. MRA HEAD FINDINGS ANTERIOR CIRCULATION: Internal carotid artery is widely patent to the level of the termini without stenosis. A1 segments widely patent.  Normal anterior communicating artery. Anterior cerebral arteries widely patent to their distal aspects without stenosis. M1 segments patent without stenosis or occlusion. Right M1 segment bifurcates early. Distal MCA branches well perfused and symmetric. POSTERIOR CIRCULATION: Vertebral arteries widely patent to the vertebrobasilar junction. Right vertebral artery slightly dominant. Short fenestration noted at the proximal basilar artery. Basilar artery widely patent to its distal aspect. Superior cerebral arteries patent bilaterally. Posterior cerebral arteries largely supplied via the basilar and are well perfused to their distal aspects. Small bilateral posterior communicating arteries noted. No aneurysm. IMPRESSION: MRI HEAD IMPRESSION: 1. Normal brain MRI.  No acute intracranial abnormality identified. 2. Bilateral mastoid effusions. MRA HEAD IMPRESSION: Normal intracranial MRA. Electronically Signed   By: Jeannine Boga M.D.   On: 07/17/2017 13:49   Ct Head Code Stroke Wo Contrast  Result Date: 07/17/2017 CLINICAL DATA:  Code stroke.  Ataxia.  Code stroke. EXAM: CT HEAD WITHOUT CONTRAST TECHNIQUE: Contiguous axial images were obtained from the base of the skull through the vertex without intravenous contrast. COMPARISON:  07/14/2017 FINDINGS: Brain: Normal appearance without evidence of atrophy, old or acute infarction, mass lesion, hemorrhage, hydrocephalus or extra-axial collection. Vascular: No abnormal vascular finding. Skull: Normal Sinuses/Orbits: Clear/normal. The patient does have fluid in the mastoid air cells on each side. Other: None ASPECTS ([place] Stroke Program Early CT Score) - Ganglionic level infarction (caudate, lentiform nuclei, internal capsule, insula, M1-M3 cortex): 7  - Supraganglionic infarction (M4-M6 cortex): 3 Total score (0-10 with 10 being normal): 10 IMPRESSION: 1. Normal appearance of the brain itself. 2. ASPECTS is 10. Bilateral mastoid effusions. These results were called by telephone at the time of interpretation on 07/17/2017 at 11:00 am to Dr. Irwin Brakeman , who verbally acknowledged these results. Electronically Signed   By: Nelson Chimes M.D.   On: 07/17/2017 11:03   Impression:  Exacerbation of gastroparesis and GERD in the setting of suboptimally controlled diabetes and other systemic illness.  Tolerating Reglan without apparent side effects.  Some dysphagia symptoms noted at the time of speech evaluation.   Recommendations: Continue Reglan and PPI.  Gastroparesis diet. Would use Bentyl only when necessary.   We'll be happy to follow-up as an outpatient. If dysphagia symptoms persist, we can consider further evaluation.

## 2017-07-18 NOTE — Discharge Instructions (Signed)
Please call and make an appointment with podiatrist on Monday as soon as possible for an appointment within 1 week. Please monitor blood glucose closely. Seek medical care or return to ED if you develop symptoms of fever, chills, malaise, worsening symptoms or acute changes. Please pack wound daily with iodoform gauze as instructed by surgeon. Please keep appointment with infection disease as scheduled. Please resume hyperbaric treatments with wound care center as scheduled.   Follow with Primary MD  Tawni Carnes, PA-C  and other consultant's as instructed your Hospitalist MD  Please get a complete blood count and chemistry panel checked by your Primary MD at your next visit, and again as instructed by your Primary MD.  Get Medicines reviewed and adjusted: Please take all your medications with you for your next visit with your Primary MD  Laboratory/radiological data: Please request your Primary MD to go over all hospital tests and procedure/radiological results at the follow up, please ask your Primary MD to get all Hospital records sent to his/her office.  In some cases, they will be blood work, cultures and biopsy results pending at the time of your discharge. Please request that your primary care M.D. follows up on these results.  Also Note the following: If you experience worsening of your admission symptoms, develop shortness of breath, life threatening emergency, suicidal or homicidal thoughts you must seek medical attention immediately by calling 911 or calling your MD immediately  if symptoms less severe.  You must read complete instructions/literature along with all the possible adverse reactions/side effects for all the Medicines you take and that have been prescribed to you. Take any new Medicines after you have completely understood and accpet all the possible adverse reactions/side effects.   Do not drive when taking Pain medications or sleeping medications  (Benzodaizepines)  Do not take more than prescribed Pain, Sleep and Anxiety Medications. It is not advisable to combine anxiety,sleep and pain medications without talking with your primary care practitioner  Special Instructions: If you have smoked or chewed Tobacco  in the last 2 yrs please stop smoking, stop any regular Alcohol  and or any Recreational drug use.  Wear Seat belts while driving.  Please note: You were cared for by a hospitalist during your hospital stay. Once you are discharged, your primary care physician will handle any further medical issues. Please note that NO REFILLS for any discharge medications will be authorized once you are discharged, as it is imperative that you return to your primary care physician (or establish a relationship with a primary care physician if you do not have one) for your post hospital discharge needs so that they can reassess your need for medications and monitor your lab values.

## 2017-07-18 NOTE — Procedures (Addendum)
[place] A. Merlene Laughter, MD     [email or web address]           HISTORY: 46 YO who presents with convulsions - SZ like activity.   MEDICATIONS: Scheduled Meds: .  stroke: mapping our early stages of recovery book   Does not apply Once  . amLODipine  10 mg Oral Daily  . atorvastatin  10 mg Oral Daily  . brimonidine  1 drop Both Eyes BID  . dicyclomine  20 mg Oral TID AC & HS  . enoxaparin (LOVENOX) injection  70 mg Subcutaneous Q24H  . hydrocerin   Topical BID  . insulin aspart  0-20 Units Subcutaneous Q4H  . insulin aspart  8 Units Subcutaneous TID WC  . insulin glargine  25 Units Subcutaneous QHS  . metoCLOPramide  10 mg Oral TID AC  . metoprolol succinate  75 mg Oral Daily  . pantoprazole  40 mg Oral BID AC  . polyethylene glycol  17 g Oral BID  . potassium chloride  40 mEq Oral Once  . senna  2 tablet Oral BID  . sertraline  100 mg Oral Daily  . sucralfate  1 g Oral QID  . timolol  1 drop Both Eyes BID   Continuous Infusions: . sodium chloride 50 mL/hr at 07/17/17 1832  . aztreonam Stopped (07/17/17 1858)  . levofloxacin (LEVAQUIN) IV Stopped (07/17/17 2233)  . vancomycin Stopped (07/17/17 1748)   PRN Meds:.ALPRAZolam, gabapentin, hydrALAZINE, labetalol, LORazepam, ondansetron (ZOFRAN) IV  Prior to Admission medications   Medication Sig Start Date End Date Taking? Authorizing Provider  atorvastatin (LIPITOR) 10 MG tablet Take 10 mg by mouth daily.   Yes [provider]  brimonidine (ALPHAGAN) 0.15 % ophthalmic solution Place 1 drop into both eyes 2 (two) times daily.    Yes [provider]  cetirizine (ZYRTEC) 10 MG tablet Take 10 mg by mouth daily.   Yes [provider]  clindamycin (CLEOCIN) 300 MG capsule Take 1 capsule (300 mg total) 3 (three) times daily by mouth. 06/22/17 07/22/17 Yes Dixon, Melton Krebs, NP  cyclobenzaprine (FLEXERIL) 10 MG tablet Take 10 mg by mouth at bedtime as needed for muscle spasms.    Yes  [provider]  dicyclomine (BENTYL) 10 MG capsule Take 1 capsule (10 mg total) by mouth 4 (four) times daily -  before meals and at bedtime. Patient taking differently: Take 10 mg by mouth 2 (two) times daily.  12/26/16  Yes Annitta Needs, NP  hydrochlorothiazide (HYDRODIURIL) 25 MG tablet Take 25 mg by mouth daily.   Yes [provider]  insulin detemir (LEVEMIR) 100 UNIT/ML injection Inject 10 Units into the skin at bedtime.    Yes [provider]  insulin NPH-regular Human (NOVOLIN 70/30) (70-30) 100 UNIT/ML injection Inject 35 Units into the skin 2 (two) times daily.   Yes [provider]  lisinopril (PRINIVIL,ZESTRIL) 40 MG tablet Take 40 mg by mouth daily.   Yes [provider]  LORazepam (ATIVAN) 0.5 MG tablet Take 0.5 mg by mouth at bedtime as needed for anxiety.   Yes [provider]  metoCLOPramide (REGLAN) 10 MG tablet Take 1 tablet (10 mg total) every 6 (six) hours as needed by mouth for nausea or refractory nausea / vomiting (nausea/headache). 06/16/17  Yes Eber Jones, MD  metoprolol succinate (TOPROL-XL) 50 MG 24 hr tablet Take 50 mg by mouth daily. Take with or immediately following a meal.   Yes [provider]  ondansetron [facility])  4 MG tablet Take 1 tablet (4 mg total) every 6 (six) hours by mouth. 06/16/17  Yes Eber Jones, MD  pantoprazole (PROTONIX) 40 MG tablet TAKE 1 TABLET BY MOUTH TWICE DAILY BEFORE  A  MEAL 03/23/17  Yes Mahala Menghini, PA-C  potassium chloride SA (K-DUR,KLOR-CON) 20 MEQ tablet Take 2 tablets (40 mEq total) 2 (two) times daily by mouth. 06/16/17  Yes Eber Jones, MD  sertraline (ZOLOFT) 100 MG tablet Take 100 mg by mouth daily.   Yes [provider]  sucralfate (CARAFATE) 1 g tablet TAKE ONE TABLET BY MOUTH 4 TIMES DAILY 03/12/16  Yes Annitta Needs, NP  timolol (TIMOPTIC) 0.5 % ophthalmic solution Place 1 drop into both eyes 2 (two) times daily.   Yes [provider]  doxycycline (VIBRA-TABS) 100 MG tablet Take 1 tablet (100 mg total) by mouth 2 (two) times daily. Patient not taking: Reported on 07/15/2017 05/25/17   [place] Callas, NP  gabapentin (NEURONTIN) 600 MG tablet Take 600 mg by mouth 3 (three) times daily with meals as needed.    [provider]      ANALYSIS: A 16 channel recording using standard 10 20 measurements is conducted for 20 minutes. There is a low voltage dominant posterior rhythm of 9 Hz which attenuates with eye opening. There is beta activity seen over the frontal fields. Awake and sleep activities are seen. Stage two NREM sleep with K complexes and spindles is seen. Photic stimulation and hyperventilation did not elicit any abnormal responses. There is no focal slowing, lateralized slowing or epileptiform activity.   IMPRESSION: This is a normal recording of the awake and sleep states.       Hamdi Kley A. Merlene Laughter, M.D.  Diplomate, Tax adviser of Psychiatry and Neurology ( Neurology).

## 2017-07-18 NOTE — Discharge Summary (Signed)
Physician Discharge Summary  Timothy Clark ERD:[id number] DOB: 1970/12/24 DOA: 07/14/2017  PCP: Tawni Carnes, PA-C Podiatrist: Dr. Amalia Hailey  Admit date: 07/14/2017 Discharge date: 07/18/2017  Admitted From: HOME  Disposition: HOME    Recommendations for Outpatient Follow-up:  1. Follow up with podiatrist in 1 weeks 2. Follow up with ID clinic on Monday as scheduled 3. Follow up with GI as scheduled in January 2019 4. Please obtain BMP/CBC in one week 5. Please arrange for outpatient sleep study 6. Please follow up on the following pending results: final culture results  PT IS DISCHARGING AGAINST MEDICAL ADVICE: He is refusing to stay any longer.  He is saying that he hates lying in bed all day.  He is refusing to allow staff to care for him.  I counseled him of the risks of going home including worsening infection and amputation and possible death and he verbalized understanding. His father was present in the room and also tried to convince him to stay but he refused.  His father says he will take him home.  The surgeon saw him and counseled him that he needs close follow up with Dr. Amalia Hailey ASAP and that he needs to do packing of the wound daily.  The patient verbalized understanding.     Discharge Condition: STABLE   CODE STATUS: FULL    Brief Hospitalization Summary: Please see all hospital notes, images, labs for full details of the hospitalization. FROM ADMISSION H&P HPI: Timothy Clark is a 46 y.o. male with medical history significant of chronic abdominal pain, esophagitis, gastritis, gastroparesis, GERD, history of H. pylori infection, hiccups, recurring nausea and vomiting, hemorrhage, glaucoma, hypertension, hyperlipidemia, type 2 diabetes who is coming to the emergency department with complaints of abdominal pain, burning chest pain, nausea and 10 or more episodes of emesis since this morning.  The patient also complains that he feels he is having a panic attack and his  gastroparesis is acting up.  He was found to be febrile in the emergency department, but denies having fever earlier. He has been following with the wound clinic for treatment of chronic right foot diabetic ulcer and is currently doing hyperbaric oxygen therapy and taking doxycycline.  He denies headache, sore throat, productive cough, wheezing, diarrhea, constipation, melena or hematochezia.  He denies dysuria, frequency or hematuria.  ED Course: Initial vital signs in the emergency department temperature 38 C, pulse 100, respirations 20, blood pressure 280/121 mmHg and O2 sat 100% on room air.  He received IV fluids, Reglan, Zofran, hydromorphone 1 mg, aztreonam, Levaquin and vancomycin.  Blood cultures x2 were drawn.  His urinalysis shows glucosuria, mild hemoglobinuria and proteinuria.  WBC 12.2, hemoglobin 12.1 g/dL and platelets 180.  CMP showed a potassium of 3.4 mmol/L.  Glucose of 257 and creatinine 1.26 mg/dL, or other values were normal.  Lactic acid x2 was normal.  Lipase was normal.  Right foot x-ray findings may be suspicious for osteomyelitis.  Please see image and full radiology report for further detail.  Brief Admission Hx: Timothy Mitton Hamlinis a 46 y.o.malewith medical history significant ofchronic abdominal pain, esophagitis, gastritis, gastroparesis, GERD, history of H. pylori infection, hiccups, recurring nausea and vomiting, hemorrhage, glaucoma, hypertension, hyperlipidemia, type 2 diabeteswho is coming to the emergency department with complaints of abdominal pain, burning chest pain, nausea and 10 or more episodes of emesis since this morning.  He was admitted for sepsis, cellulitis and constipation.   CODE STROKE DOCUMENTATION I received a call from the RN that  patient developed acute slurred speech with aphasia, confusion and left side weakness at 10:25 am.  I asked for a CODE STROKE to be called.  I immediately came to assess patient and found him to be confused and had  slurred speech with slight facial droop and left sided weakness. I went with him to CT scanner and came back up to room with him and stayed for teleneurologist evaluation.  Radiology called me and told me that the prelim CT head was without acute change.  The patient is recently postop and teleneurology evaluated him and he is not a candidate for tPA.  His father and other family members were in the room.  They were updated. The neurologist recommended that patient get a stroke workup performed.  I placed consult for inpatient neurology.  I ordered an EEG and an MRI brain.  Both studies ended up being negative for any significant findings.  Pt was seen by neurologist.  He recommended patient be treated for sleep apnea.  Pt refused to wear the CPAP in the hospital.     MDM/Assessment & Plan:   1. Sepsis secondary to cellulitis and osteomyelitis of right foot - MRI and plain film imaging of right foot highly suspicious for osteomyelitis.  Continue IV antibiotics, obtain podiatry consultation.  I spoke with podiatrist and he is recommending debridement - pt declined amputation.  Continue antibiotics, await deep wound culture results.  Pt is scheduled to see ID on Monday in 2 days for outpatient appt.  He is discharged on levofloxacin/clindamycin.  He says he will discuss definitive treatment with Dr. Amalia Hailey about the osteomyelitis.  He refused to stay here for further IV antibiotics and declined further procedures, would not accept a PICC line at this time without discussing with Dr. Amalia Hailey.  2. Code Stroke (see above) - stroke work up initiated, neurology consulted,   EEG negative MRI brain negative.   I strongly suspect that some symptoms may be psychosomatic.  Echo normal EF (60-65%) with grade 2 DD.  3. 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  4. GAD - severe anxiety disorder, lorazepam ordered  And had to use oral seroquel in hospital because patient became so  agitated and would not stop trying to get out of bed. The seroquel seemed to help.     5. Insensate feet - Pt is at very HIGH RISK for amputation.  6. Dyslipidemia - likely secondary to poorly controlled diabetes mellitus.  Resume home atorvastatin.  7. Glaucoma - continue home eye drops.  8. Stage 3 CKD - mild bump in Cr likely related to dehydration, he is not eating and drinking well, give IVFs.  Follow.  9. Severe diabetic gastroparesis - IVFs, IV nausea meds and IV metoclopromide ordered and still no improvement, will ask for GI evaluation for possible EGD.   10. Hypokalemia - repleting oral and IV, Mg OK, dc home to resume his oral potassium and reduced HCTZ to 12.5 mg.  11. GERD - IV protonix ordered.  12. Moderate constipation - laxatives ordered and given with BMs reported.   13. Anemia in CKD - Hg remained stable at 10.5.  Recheck outpatient.       DVT prophylaxis: SCDs Code Status: Full  Family Communication: father present Disposition Plan: Home   Consultants:  Podiatry  Neurology  Discharge Diagnoses:  Principal Problem:   Sepsis due to cellulitis [facility]) Active Problems:   Diabetes mellitus type 2 with complications, uncontrolled ([place])  Hyperlipidemia   Depression   Essential hypertension   Gastroparesis due to DM ([facility])   Hypokalemia   GERD (gastroesophageal reflux disease)   Glaucoma   Anemia   Diabetic foot infection ([place])  Discharge Instructions: Discharge Instructions    Call MD for:  difficulty breathing, headache or visual disturbances   Complete by:  As directed    Call MD for:  extreme fatigue   Complete by:  As directed    Call MD for:  persistant dizziness or light-headedness   Complete by:  As directed    Call MD for:  persistant nausea and vomiting   Complete by:  As directed    Call MD for:  redness, tenderness, or signs of infection (pain, swelling, redness, odor or green/yellow discharge around incision site)   Complete by:  As directed     Call MD for:  severe uncontrolled pain   Complete by:  As directed    Diet - low sodium heart healthy   Complete by:  As directed    Discharge instructions   Complete by:  As directed    Please call and make an appointment with podiatrist on Monday as soon as possible for an appointment within 1 week. Please monitor blood glucose closely. Seek medical care or return to ED if you develop symptoms of fever, chills, malaise, worsening symptoms or acute changes. Please pack wound daily with iodoform gauze as instructed by surgeon. Please keep appointment with infection disease as scheduled. Please resume hyperbaric treatments with wound care center as scheduled.     Allergies as of 07/18/2017      Reactions   Donnatal [pb-hyoscy-atropine-scopol Er] Anaphylaxis   Reaction to GI cocktail   Fish Allergy Anaphylaxis, Hives, Rash   Haldol [haloperidol Lactate] Other (See Comments)   Chest Pain   Lidocaine Anaphylaxis   Reaction to GI cocktail   Maalox [calcium Carbonate Antacid] Anaphylaxis   Reaction to GI cocktail   Aspirin Hives, Itching   Bactrim [sulfamethoxazole-trimethoprim] Itching   Keflex [cephalexin] Hives, Itching, Nausea And Vomiting   Penicillins Hives, Itching   Has patient had a PCN reaction causing immediate rash, facial/tongue/throat swelling, SOB or lightheadedness with hypotension: yes Has patient had a PCN reaction causing severe rash involving mucus membranes or skin necrosis: yes Has patient had a PCN reaction that required hospitalization no Has patient had a PCN reaction occurring within the last 10 years: yes If all of the above answers are "NO", then may proceed with Cephalospor   Phenergan [promethazine Hcl] Nausea And Vomiting      Medication List    STOP taking these medications   doxycycline 100 MG tablet Commonly known as:  VIBRA-TABS     TAKE these medications   amLODipine 5 MG tablet Commonly known as:  NORVASC Take 1 tablet (5 mg total) by  mouth daily.   atorvastatin 10 MG tablet Commonly known as:  LIPITOR Take 10 mg by mouth daily.   brimonidine 0.15 % ophthalmic solution Commonly known as:  ALPHAGAN Place 1 drop into both eyes 2 (two) times daily.   cetirizine 10 MG tablet Commonly known as:  ZYRTEC Take 10 mg by mouth daily.   clindamycin 300 MG capsule Commonly known as:  CLEOCIN Take 1 capsule (300 mg total) 3 (three) times daily by mouth.   cyclobenzaprine 10 MG tablet Commonly known as:  FLEXERIL Take 10 mg by mouth at bedtime as needed for muscle spasms.   dicyclomine 10 MG capsule Commonly known  as:  BENTYL Take 1 capsule (10 mg total) by mouth 4 (four) times daily -  before meals and at bedtime. What changed:  when to take this   gabapentin 600 MG tablet Commonly known as:  NEURONTIN Take 600 mg by mouth 3 (three) times daily with meals as needed.   hydrochlorothiazide 12.5 MG tablet Commonly known as:  HYDRODIURIL Take 1 tablet (12.5 mg total) by mouth daily. What changed:    medication strength  how much to take   insulin detemir 100 UNIT/ML injection Commonly known as:  LEVEMIR Inject 10 Units into the skin at bedtime.   insulin NPH-regular Human (70-30) 100 UNIT/ML injection Commonly known as:  NOVOLIN 70/30 Inject 35 Units into the skin 2 (two) times daily.   levofloxacin 500 MG tablet Commonly known as:  LEVAQUIN Take 1 tablet (500 mg total) by mouth daily for 14 days.   lisinopril 40 MG tablet Commonly known as:  PRINIVIL,ZESTRIL Take 40 mg by mouth daily.   LORazepam 0.5 MG tablet Commonly known as:  ATIVAN Take 0.5 mg by mouth at bedtime as needed for anxiety.   metoCLOPramide 10 MG tablet Commonly known as:  REGLAN Take 1 tablet (10 mg total) every 6 (six) hours as needed by mouth for nausea or refractory nausea / vomiting (nausea/headache).   metoprolol succinate 50 MG 24 hr tablet Commonly known as:  TOPROL-XL Take 50 mg by mouth daily. Take with or immediately  following a meal.   ondansetron 4 MG tablet Commonly known as:  ZOFRAN Take 1 tablet (4 mg total) every 6 (six) hours by mouth.   pantoprazole 40 MG tablet Commonly known as:  PROTONIX TAKE 1 TABLET BY MOUTH TWICE DAILY BEFORE  A  MEAL   potassium chloride SA 20 MEQ tablet Commonly known as:  K-DUR,KLOR-CON Take 2 tablets (40 mEq total) 2 (two) times daily by mouth.   sertraline 100 MG tablet Commonly known as:  ZOLOFT Take 100 mg by mouth daily.   sucralfate 1 g tablet Commonly known as:  CARAFATE TAKE ONE TABLET BY MOUTH 4 TIMES DAILY   timolol 0.5 % ophthalmic solution Commonly known as:  TIMOPTIC Place 1 drop into both eyes 2 (two) times daily.      Follow-up Information    Tawni Carnes, [place]. Schedule an appointment as soon as possible for a visit in 1 week(s).   Specialty:  Physician Assistant Contact information: [address] [place] [address]        Edrick Kins, DPM. Schedule an appointment as soon as possible for a visit in 1 week(s).   Specialty:  Podiatry Contact information: [address] [place] [address] [phone]          Allergies  Allergen Reactions  . Donnatal [Pb-Hyoscy-Atropine-Scopol Er] Anaphylaxis    Reaction to GI cocktail  . Fish Allergy Anaphylaxis, Hives and Rash  . Haldol [Haloperidol Lactate] Other (See Comments)    Chest Pain  . Lidocaine Anaphylaxis    Reaction to GI cocktail  . Maalox [Calcium Carbonate Antacid] Anaphylaxis    Reaction to GI cocktail  . Aspirin Hives and Itching  . Bactrim [Sulfamethoxazole-Trimethoprim] Itching  . Keflex [Cephalexin] Hives, Itching and Nausea And Vomiting  . Penicillins Hives and Itching    Has patient had a PCN reaction causing immediate rash, facial/tongue/throat swelling, SOB or lightheadedness with hypotension: yes Has patient had a PCN reaction causing severe rash involving mucus membranes or skin necrosis: yes Has  patient had a PCN reaction that required  hospitalization no Has patient had a PCN reaction occurring within the last 10 years: yes If all of the above answers are "NO", then may proceed with Cephalospor  . Phenergan [Promethazine Hcl] Nausea And Vomiting   Allergies as of 07/18/2017      Reactions   Donnatal [pb-hyoscy-atropine-scopol Er] Anaphylaxis   Reaction to GI cocktail   Fish Allergy Anaphylaxis, Hives, Rash   Haldol [haloperidol Lactate] Other (See Comments)   Chest Pain   Lidocaine Anaphylaxis   Reaction to GI cocktail   Maalox [calcium Carbonate Antacid] Anaphylaxis   Reaction to GI cocktail   Aspirin Hives, Itching   Bactrim [sulfamethoxazole-trimethoprim] Itching   Keflex [cephalexin] Hives, Itching, Nausea And Vomiting   Penicillins Hives, Itching   Has patient had a PCN reaction causing immediate rash, facial/tongue/throat swelling, SOB or lightheadedness with hypotension: yes Has patient had a PCN reaction causing severe rash involving mucus membranes or skin necrosis: yes Has patient had a PCN reaction that required hospitalization no Has patient had a PCN reaction occurring within the last 10 years: yes If all of the above answers are "NO", then may proceed with Cephalospor   Phenergan [promethazine Hcl] Nausea And Vomiting      Medication List    STOP taking these medications   doxycycline 100 MG tablet Commonly known as:  VIBRA-TABS     TAKE these medications   amLODipine 5 MG tablet Commonly known as:  NORVASC Take 1 tablet (5 mg total) by mouth daily.   atorvastatin 10 MG tablet Commonly known as:  LIPITOR Take 10 mg by mouth daily.   brimonidine 0.15 % ophthalmic solution Commonly known as:  ALPHAGAN Place 1 drop into both eyes 2 (two) times daily.   cetirizine 10 MG tablet Commonly known as:  ZYRTEC Take 10 mg by mouth daily.   clindamycin 300 MG capsule Commonly known as:  CLEOCIN Take 1 capsule (300 mg total) 3 (three) times daily by mouth.   cyclobenzaprine 10 MG  tablet Commonly known as:  FLEXERIL Take 10 mg by mouth at bedtime as needed for muscle spasms.   dicyclomine 10 MG capsule Commonly known as:  BENTYL Take 1 capsule (10 mg total) by mouth 4 (four) times daily -  before meals and at bedtime. What changed:  when to take this   gabapentin 600 MG tablet Commonly known as:  NEURONTIN Take 600 mg by mouth 3 (three) times daily with meals as needed.   hydrochlorothiazide 12.5 MG tablet Commonly known as:  HYDRODIURIL Take 1 tablet (12.5 mg total) by mouth daily. What changed:    medication strength  how much to take   insulin detemir 100 UNIT/ML injection Commonly known as:  LEVEMIR Inject 10 Units into the skin at bedtime.   insulin NPH-regular Human (70-30) 100 UNIT/ML injection Commonly known as:  NOVOLIN 70/30 Inject 35 Units into the skin 2 (two) times daily.   levofloxacin 500 MG tablet Commonly known as:  LEVAQUIN Take 1 tablet (500 mg total) by mouth daily for 14 days.   lisinopril 40 MG tablet Commonly known as:  PRINIVIL,ZESTRIL Take 40 mg by mouth daily.   LORazepam 0.5 MG tablet Commonly known as:  ATIVAN Take 0.5 mg by mouth at bedtime as needed for anxiety.   metoCLOPramide 10 MG tablet Commonly known as:  REGLAN Take 1 tablet (10 mg total) every 6 (six) hours as needed by mouth for nausea or refractory nausea / vomiting (  nausea/headache).   metoprolol succinate 50 MG 24 hr tablet Commonly known as:  TOPROL-XL Take 50 mg by mouth daily. Take with or immediately following a meal.   ondansetron 4 MG tablet Commonly known as:  ZOFRAN Take 1 tablet (4 mg total) every 6 (six) hours by mouth.   pantoprazole 40 MG tablet Commonly known as:  PROTONIX TAKE 1 TABLET BY MOUTH TWICE DAILY BEFORE  A  MEAL   potassium chloride SA 20 MEQ tablet Commonly known as:  K-DUR,KLOR-CON Take 2 tablets (40 mEq total) 2 (two) times daily by mouth.   sertraline 100 MG tablet Commonly known as:  ZOLOFT Take 100 mg by  mouth daily.   sucralfate 1 g tablet Commonly known as:  CARAFATE TAKE ONE TABLET BY MOUTH 4 TIMES DAILY   timolol 0.5 % ophthalmic solution Commonly known as:  TIMOPTIC Place 1 drop into both eyes 2 (two) times daily.       Procedures/Studies: Ct Abdomen Pelvis Wo Contrast  Result Date: 07/14/2017 CLINICAL DATA:  46 year old male with nausea and vomiting abdominal pain. EXAM: CT ABDOMEN AND PELVIS WITHOUT CONTRAST TECHNIQUE: Multidetector CT imaging of the abdomen and pelvis was performed following the standard protocol without IV contrast. COMPARISON:  Abdominal radiograph dated 07/08/2014 and CT dated 12/22/2016 FINDINGS: Evaluation of this exam is limited in the absence of intravenous contrast. Lower chest: The visualized lung bases are clear. There is no intra-abdominal free air or free fluid. Hepatobiliary: No focal liver abnormality is seen. No gallstones, gallbladder wall thickening, or biliary dilatation. Pancreas: Unremarkable. No pancreatic ductal dilatation or surrounding inflammatory changes. Spleen: Normal in size without focal abnormality. Adrenals/Urinary Tract: The adrenal glands are unremarkable. 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 There is no hydronephrosis or nephrolithiasis on either side. 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 The visualized ureters and urinary bladder appear unremarkable. Stomach/Bowel: There is a moderate colonic stool burden. There is no bowel obstruction or active inflammation. The appendix is normal. Vascular/Lymphatic: The abdominal aorta and IVC are grossly unremarkable on this noncontrast CT. No portal venous gas. There is no adenopathy. Reproductive: The prostate and seminal vesicles are grossly unremarkable. Other: Small fat containing right inguinal hernia. Musculoskeletal: There is degenerative changes of the spine most prominent at L4-L5 with bridging osteophyte anteriorly. No  acute osseous pathology. IMPRESSION: 1. No acute intra-abdominal or pelvic pathology. Moderate colonic stool burden. No bowel obstruction or active inflammation. Normal appendix. 2. No hydronephrosis or nephrolithiasis. Electronically Signed   By: Anner Crete M.D.   On: 07/14/2017 22:24   Ct Head Wo Contrast  Result Date: 07/14/2017 CLINICAL DATA:  Chest pain, shortness of breath, anxiety, abdominal pain, vision changes. EXAM: CT HEAD WITHOUT CONTRAST TECHNIQUE: Contiguous axial images were obtained from the base of the skull through the vertex without intravenous contrast. COMPARISON:  CT HEAD in January 26, 2017 FINDINGS: BRAIN: No intraparenchymal hemorrhage, mass effect nor midline shift. Mild parenchymal brain volume loss for age. No hydrocephalus . No acute large vascular territory infarcts. No abnormal extra-axial fluid collections. Basal cisterns are patent. VASCULAR: Trace calcific atherosclerosis carotid siphons. SKULL/SOFT TISSUES: No skull fracture. No significant soft tissue swelling. Similar RIGHT parietal scalp scarring. ORBITS/SINUSES: Status post LEFT ocular lens implant with similar probable reservoir, possible glaucoma drainage device. Small bilateral mastoid effusions without air cell coalescence. Paranasal sinuses are well-aerated. OTHER: None. IMPRESSION: 1. No acute intracranial process. 2. Mild parenchymal brain volume loss for age. Electronically Signed  By: Elon Alas M.D.   On: 07/14/2017 22:25   Mr Brain Wo Contrast  Result Date: 07/17/2017 CLINICAL DATA:  Initial evaluation for acute confusion, and aphasia. EXAM: MRI HEAD WITHOUT CONTRAST MRA HEAD WITHOUT CONTRAST TECHNIQUE: Multiplanar, multiecho pulse sequences of the brain and surrounding structures were obtained without intravenous contrast. Angiographic images of the head were obtained using MRA technique without contrast. COMPARISON:  Prior CT from earlier the same day. FINDINGS: MRI HEAD FINDINGS Brain: Cerebral  volume within normal limits for patient age. No focal parenchymal signal abnormality identified. No abnormal foci of restricted diffusion to suggest acute or subacute ischemia. Gray-white matter differentiation well maintained. No encephalomalacia to suggest chronic infarction. No foci of susceptibility artifact to suggest acute or chronic intracranial hemorrhage. No mass lesion, midline shift or mass effect. No hydrocephalus. No extra-axial fluid collection. Major dural sinuses are grossly patent. Pituitary gland and suprasellar region are normal. Midline structures intact and normal. Vascular: Major intracranial vascular flow voids well maintained and normal in appearance. Skull and upper cervical spine: Craniocervical junction normal. Visualized upper cervical spine within normal limits. Bone marrow signal intensity normal. No scalp soft tissue abnormality. Sinuses/Orbits: Globes and orbital soft tissues demonstrate no acute abnormality. 2 cm ovoid cystic lesion at the superolateral aspect of the left globe, indeterminate, but stable relative to multiple previous exams dating back to 2016. Patient status post lens extraction bilaterally. Paranasal sinuses are clear. Bilateral mastoid effusions noted. Inner ear structures normal. Other: None. MRA HEAD FINDINGS ANTERIOR CIRCULATION: Internal carotid artery is widely patent to the level of the termini without stenosis. A1 segments widely patent. Normal anterior communicating artery. Anterior cerebral arteries widely patent to their distal aspects without stenosis. M1 segments patent without stenosis or occlusion. Right M1 segment bifurcates early. Distal MCA branches well perfused and symmetric. POSTERIOR CIRCULATION: Vertebral arteries widely patent to the vertebrobasilar junction. Right vertebral artery slightly dominant. Short fenestration noted at the proximal basilar artery. Basilar artery widely patent to its distal aspect. Superior cerebral arteries patent  bilaterally. Posterior cerebral arteries largely supplied via the basilar and are well perfused to their distal aspects. Small bilateral posterior communicating arteries noted. No aneurysm. IMPRESSION: MRI HEAD IMPRESSION: 1. Normal brain MRI.  No acute intracranial abnormality identified. 2. Bilateral mastoid effusions. MRA HEAD IMPRESSION: Normal intracranial MRA. Electronically Signed   By: Jeannine Boga M.D.   On: 07/17/2017 13:49   Mr Foot Right Wo Contrast  Result Date: 07/15/2017 CLINICAL DATA:  Diabetic patient with a chronic, nonhealing wound along the distal first metatarsal. Osteomyelitis in the distal first metatarsal on prior MRI. EXAM: MRI OF THE RIGHT FOREFOOT WITHOUT CONTRAST TECHNIQUE: Multiplanar, multisequence MR imaging of the right forefoot was performed. No intravenous contrast was administered. COMPARISON:  MRI right foot 03/19/2017. Plain films right foot 11/18/2016 and 07/14/2017. FINDINGS: This examination was ordered with and without contrast but contrast could not be given due to the patient's renal insufficiency. Bones/Joint/Cartilage There is marrow edema with corresponding decreased T1 signal in approximately the distal 4 cm of the first metatarsal, throughout the proximal phalanx of the great toe and in both the medial and lateral sesamoids of the first MTP joint. Bony destructive change along the lateral cortex of the distal first metatarsal is identified. There is a first MTP joint effusion. Ligaments Intact. Muscles and Tendons There is edema throughout intrinsic musculature of the foot. No intramuscular fluid collection is identified. Soft tissues A large skin ulceration is seen along the medial margin of the  first metatarsal head. Fluid collection in the first intermetatarsal space measures 1.3 cm craniocaudal by 0.5 cm transverse by 1 cm long. No other focal fluid collection is identified. IMPRESSION: Markedly worsened appearance of the patient's right foot since the  comparison MRI. Abnormal marrow signal in the distal 4 cm of the first metatarsal, throughout the proximal phalanx of the great toe and in both the medial and lateral sesamoids of the first MTP joint is consistent with osteomyelitis. First MTP joint effusion is likely due to septic joint. Intense subcutaneous edema over the foot consistent with cellulitis. Fluid in the first intermetatarsal space is consistent with bursitis, possibly septic. Large skin ulceration adjacent to the first metatarsal head is noted. Electronically Signed   By: Inge Rise M.D.   On: 07/15/2017 09:56   US Carotid Bilateral (at [facility] Only)  Result Date: 07/17/2017 CLINICAL DATA:  Stroke symptoms, ataxia, hypertension, visual disturbance, diabetes EXAM: BILATERAL CAROTID DUPLEX ULTRASOUND TECHNIQUE: Pearline Cables scale imaging, color Doppler and duplex ultrasound were performed of bilateral carotid and vertebral arteries in the neck. COMPARISON:  None avail FINDINGS: Criteria: Quantification of carotid stenosis is based on velocity parameters that correlate the residual internal carotid diameter with NASCET-based stenosis levels, using the diameter of the distal internal carotid lumen as the denominator for stenosis measurement. The following velocity measurements were obtained: RIGHT ICA:  80/21 cm/sec CCA:  902/40 cm/sec SYSTOLIC ICA/CCA RATIO:  0.8 DIASTOLIC ICA/CCA RATIO:  0.9 ECA:  122 cm/sec LEFT ICA:  117/25 cm/sec CCA:  97/35 cm/sec SYSTOLIC ICA/CCA RATIO:  1.2 DIASTOLIC ICA/CCA RATIO:  1.0 ECA:  160 cm/sec RIGHT CAROTID ARTERY: Minor echogenic shadowing plaque formation. No hemodynamically significant right ICA stenosis, velocity elevation, or turbulent flow. Degree of narrowing less than 50%. RIGHT VERTEBRAL ARTERY:  Antegrade LEFT CAROTID ARTERY: Similar scattered minor echogenic plaque formation. No hemodynamically significant left ICA stenosis, velocity elevation, or turbulent flow. LEFT VERTEBRAL ARTERY:  Antegrade  IMPRESSION: Minor carotid atherosclerosis. No hemodynamically significant ICA stenosis. Degree of narrowing less than 50% bilaterally by ultrasound criteria. Patent antegrade vertebral flow bilaterally Electronically Signed   By: Jerilynn Mages.  Shick M.D.   On: 07/17/2017 14:16   US Arterial Abi (screening Lower Extremity)  Result Date: 07/15/2017 CLINICAL DATA:  Diabetic foot wound, poor healing EXAM: NONINVASIVE PHYSIOLOGIC VASCULAR STUDY OF BILATERAL LOWER EXTREMITIES TECHNIQUE: Evaluation of both lower extremities were performed at rest, including calculation of ankle-brachial indices with single level Doppler, pressure and pulse volume recording. COMPARISON:  12/06/2016 FINDINGS: Right ABI:  Not obtainable, vessels are noncompressible Left ABI:  Not obtainable, vessels are noncompressible Right Lower Extremity:  Normal arterial waveforms at the ankle. Left Lower Extremity:  Normal arterial waveforms at the ankle. Accurate ABIs cannot be obtained secondary to noncompressible vessels at the ankle. IMPRESSION: ABIs cannot be obtained secondary to vessel noncompressibility No significant waveform abnormality involving the posterior tibial and dorsalis pedis arteries bilaterally. Electronically Signed   By: Jerilynn Mages.  Shick M.D.   On: 07/15/2017 12:44   Dg Foot Complete Right  Result Date: 07/15/2017 CLINICAL DATA:  46 y/o M; laceration to medial aspect of right foot. EXAM: RIGHT FOOT COMPLETE - 3+ VIEW COMPARISON:  02/25/2017 right foot radiographs. FINDINGS: Bony defect of medial aspect of the first metatarsal head with a bone displaced medially in the soft tissues as well as overlying soft tissue irregularity. Joint spaces are well maintained. Vascular calcifications noted. Small dorsal calcaneal enthesophyte. IMPRESSION: Bony defect of medial aspect of first metatarsal head with a  bone displaced medially into soft tissues. In the setting of trauma this may represent a soft tissue and bone laceration. Destructive changes  of osteomyelitis are possible in the setting of infection. Electronically Signed   By: Kristine Garbe M.D.   On: 07/15/2017 00:26   Mr Virgel Paling ZO Contrast  Result Date: 07/17/2017 CLINICAL DATA:  Initial evaluation for acute confusion, and aphasia. EXAM: MRI HEAD WITHOUT CONTRAST MRA HEAD WITHOUT CONTRAST TECHNIQUE: Multiplanar, multiecho pulse sequences of the brain and surrounding structures were obtained without intravenous contrast. Angiographic images of the head were obtained using MRA technique without contrast. COMPARISON:  Prior CT from earlier the same day. FINDINGS: MRI HEAD FINDINGS Brain: Cerebral volume within normal limits for patient age. No focal parenchymal signal abnormality identified. No abnormal foci of restricted diffusion to suggest acute or subacute ischemia. Gray-white matter differentiation well maintained. No encephalomalacia to suggest chronic infarction. No foci of susceptibility artifact to suggest acute or chronic intracranial hemorrhage. No mass lesion, midline shift or mass effect. No hydrocephalus. No extra-axial fluid collection. Major dural sinuses are grossly patent. Pituitary gland and suprasellar region are normal. Midline structures intact and normal. Vascular: Major intracranial vascular flow voids well maintained and normal in appearance. Skull and upper cervical spine: Craniocervical junction normal. Visualized upper cervical spine within normal limits. Bone marrow signal intensity normal. No scalp soft tissue abnormality. Sinuses/Orbits: Globes and orbital soft tissues demonstrate no acute abnormality. 2 cm ovoid cystic lesion at the superolateral aspect of the left globe, indeterminate, but stable relative to multiple previous exams dating back to 2016. Patient status post lens extraction bilaterally. Paranasal sinuses are clear. Bilateral mastoid effusions noted. Inner ear structures normal. Other: None. MRA HEAD FINDINGS ANTERIOR CIRCULATION: Internal  carotid artery is widely patent to the level of the termini without stenosis. A1 segments widely patent. Normal anterior communicating artery. Anterior cerebral arteries widely patent to their distal aspects without stenosis. M1 segments patent without stenosis or occlusion. Right M1 segment bifurcates early. Distal MCA branches well perfused and symmetric. POSTERIOR CIRCULATION: Vertebral arteries widely patent to the vertebrobasilar junction. Right vertebral artery slightly dominant. Short fenestration noted at the proximal basilar artery. Basilar artery widely patent to its distal aspect. Superior cerebral arteries patent bilaterally. Posterior cerebral arteries largely supplied via the basilar and are well perfused to their distal aspects. Small bilateral posterior communicating arteries noted. No aneurysm. IMPRESSION: MRI HEAD IMPRESSION: 1. Normal brain MRI.  No acute intracranial abnormality identified. 2. Bilateral mastoid effusions. MRA HEAD IMPRESSION: Normal intracranial MRA. Electronically Signed   By: Jeannine Boga M.D.   On: 07/17/2017 13:49   Ct Head Code Stroke Wo Contrast  Result Date: 07/17/2017 CLINICAL DATA:  Code stroke.  Ataxia.  Code stroke. EXAM: CT HEAD WITHOUT CONTRAST TECHNIQUE: Contiguous axial images were obtained from the base of the skull through the vertex without intravenous contrast. COMPARISON:  07/14/2017 FINDINGS: Brain: Normal appearance without evidence of atrophy, old or acute infarction, mass lesion, hemorrhage, hydrocephalus or extra-axial collection. Vascular: No abnormal vascular finding. Skull: Normal Sinuses/Orbits: Clear/normal. The patient does have fluid in the mastoid air cells on each side. Other: None ASPECTS ([place] Stroke Program Early CT Score) - Ganglionic level infarction (caudate, lentiform nuclei, internal capsule, insula, M1-M3 cortex): 7 - Supraganglionic infarction (M4-M6 cortex): 3 Total score (0-10 with 10 being normal): 10 IMPRESSION: 1.  Normal appearance of the brain itself. 2. ASPECTS is 10. Bilateral mastoid effusions. These results were called by telephone at the time of interpretation on  07/17/2017 at 11:00 am to Dr. Irwin Brakeman , who verbally acknowledged these results. Electronically Signed   By: Nelson Chimes M.D.   On: 07/17/2017 11:03      Subjective: Pt reports that he wants to go home, he hates being here lying in bed, he is constantly trying to get up and leave, says he will see Dr. Amalia Hailey next week and had ID appt on Monday, I was unable to convince him to stay.  His father was present who will take him home. Podiatry evaluated his wound and says that he needs to pack it daily which patient agreed to do.     Discharge Exam: Vitals:   07/18/17 0530 07/18/17 0930  BP: 120/78 130/69  Pulse: 87 86  Resp: (!) 22 18  Temp: 98 F (36.7 C) 98.4 F (36.9 C)  SpO2: 100% 98%   Vitals:   07/17/17 2328 07/18/17 0130 07/18/17 0530 07/18/17 0930  BP:  120/71 120/78 130/69  Pulse: 76 66 87 86  Resp: 20 (!) 24 (!) 22 18  Temp:  (!) 97.4 F (36.3 C) 98 F (36.7 C) 98.4 F (36.9 C)  TempSrc:  Axillary Oral Oral  SpO2: 97% 99% 100% 98%  Weight:      Height:       General exam: awake, alert, NAD. Strabismus noted.  MMM. Respiratory system: Clear. No increased work of breathing. Cardiovascular system: S1 & S2 heard, RRR. No JVD, murmurs, gallops, clicks or pedal edema. Gastrointestinal system: Abdomen is nondistended, soft and nontender. Normal bowel sounds heard. Central nervous system: nonfocal.  Extremities: right foot wound clean and dry.    The results of significant diagnostics from this hospitalization (including imaging, microbiology, ancillary and laboratory) are listed below for reference.     Microbiology: Recent Results (from the past 240 hour(s))  Blood Culture (routine x 2)     Status: None (Preliminary result)   Collection Time: 07/14/17  8:18 PM  Result Value Ref Range Status   Specimen  Description BLOOD RIGHT HAND  Final   Special Requests   Final    BOTTLES DRAWN AEROBIC AND ANAEROBIC Blood Culture results may not be optimal due to an inadequate volume of blood received in culture bottles   Culture NO GROWTH 4 DAYS  Final   Report Status PENDING  Incomplete  Blood Culture (routine x 2)     Status: None (Preliminary result)   Collection Time: 07/14/17  8:18 PM  Result Value Ref Range Status   Specimen Description BLOOD RIGHT FOREARM  Final   Special Requests   Final    BOTTLES DRAWN AEROBIC AND ANAEROBIC Blood Culture results may not be optimal due to an inadequate volume of blood received in culture bottles   Culture NO GROWTH 4 DAYS  Final   Report Status PENDING  Incomplete  Surgical pcr screen     Status: None   Collection Time: 07/15/17  2:35 PM  Result Value Ref Range Status   MRSA, PCR NEGATIVE NEGATIVE Final   Staphylococcus aureus NEGATIVE NEGATIVE Final    Comment: (NOTE) The Xpert SA Assay (FDA approved for NASAL specimens in patients 71 years of age and older), is one component of a comprehensive surveillance program. It is not intended to diagnose infection nor to guide or monitor treatment.      Labs: BNP (last 3 results) No results for input(s): BNP in the last 8760 hours. Basic Metabolic Panel: Recent Labs  Lab 07/15/17 [phone] 07/16/17 0526 07/17/17 3536  07/17/17 1053 07/18/17 0815  NA 136 136 135 134* 137  K 3.6 3.8 3.3* 3.3* 3.0*  CL 101 104 103 103 106  CO2 26 24 25 24 24   GLUCOSE 274* 267* 213* 245* 139*  BUN 18 24* 28* 29* 23*  CREATININE 1.42* 1.68* 1.95* 1.95* 1.79*  CALCIUM 8.5* 8.3* 8.2* 8.2* 8.1*  MG 1.3*  --  1.5*  --  1.7   Liver Function Tests: Recent Labs  Lab 07/14/17 1927 07/18/17 0815  AST 21 17  ALT 18 10*  ALKPHOS 66 43  BILITOT 0.7 0.8  PROT 7.5 5.8*  ALBUMIN 3.6 2.6*   Recent Labs  Lab 07/14/17 1927  LIPASE 39   No results for input(s): AMMONIA in the last 168 hours. CBC: Recent Labs  Lab  07/15/17 0620 07/16/17 0526 07/17/17 0620 07/17/17 1053 07/18/17 0815  WBC 8.6 9.7 10.8* 10.2 8.8  NEUTROABS 6.3 7.9* 8.9*  --  6.9  HGB 11.0* 11.3* 10.4* 10.5* 10.3*  HCT 32.1* 33.4* 30.7* 31.0* 30.3*  MCV 90.7 91.8 91.6 91.2 91.3  PLT 151 149* 147* 142* 136*   Cardiac Enzymes: Recent Labs  Lab 07/17/17 1053  TROPONINI <0.03   BNP: Invalid input(s): POCBNP CBG: Recent Labs  Lab 07/17/17 1444 07/17/17 2109 07/18/17 0336 07/18/17 0830 07/18/17 1201  GLUCAP 197* 122* 110* 133* 114*   D-Dimer No results for input(s): DDIMER in the last 72 hours. Hgb A1c Recent Labs    07/17/17 1053  HGBA1C 7.5*   Lipid Profile Recent Labs    07/17/17 1053 07/18/17 0815  CHOL 143 132  HDL 27* 24*  LDLCALC 88 72  TRIG 140 178*  CHOLHDL 5.3 5.5   Thyroid function studies No results for input(s): TSH, T4TOTAL, T3FREE, THYROIDAB in the last 72 hours.  Invalid input(s): FREET3 Anemia work up No results for input(s): VITAMINB12, FOLATE, FERRITIN, TIBC, IRON, RETICCTPCT in the last 72 hours. Urinalysis    Component Value Date/Time   COLORURINE YELLOW 07/14/2017 2200   APPEARANCEUR CLEAR 07/14/2017 2200   LABSPEC 1.013 07/14/2017 2200   PHURINE 7.0 07/14/2017 2200   GLUCOSEU >=500 (A) 07/14/2017 2200   HGBUR SMALL (A) 07/14/2017 2200   BILIRUBINUR NEGATIVE 07/14/2017 2200   KETONESUR NEGATIVE 07/14/2017 2200   PROTEINUR >=300 (A) 07/14/2017 2200   UROBILINOGEN 0.2 06/07/2015 1353   NITRITE NEGATIVE 07/14/2017 2200   LEUKOCYTESUR NEGATIVE 07/14/2017 2200   Sepsis Labs Invalid input(s): PROCALCITONIN,  WBC,  LACTICIDVEN Microbiology Recent Results (from the past 240 hour(s))  Blood Culture (routine x 2)     Status: None (Preliminary result)   Collection Time: 07/14/17  8:18 PM  Result Value Ref Range Status   Specimen Description BLOOD RIGHT HAND  Final   Special Requests   Final    BOTTLES DRAWN AEROBIC AND ANAEROBIC Blood Culture results may not be optimal due to  an inadequate volume of blood received in culture bottles   Culture NO GROWTH 4 DAYS  Final   Report Status PENDING  Incomplete  Blood Culture (routine x 2)     Status: None (Preliminary result)   Collection Time: 07/14/17  8:18 PM  Result Value Ref Range Status   Specimen Description BLOOD RIGHT FOREARM  Final   Special Requests   Final    BOTTLES DRAWN AEROBIC AND ANAEROBIC Blood Culture results may not be optimal due to an inadequate volume of blood received in culture bottles   Culture NO GROWTH 4 DAYS  Final   Report  Status PENDING  Incomplete  Surgical pcr screen     Status: None   Collection Time: 07/15/17  2:35 PM  Result Value Ref Range Status   MRSA, PCR NEGATIVE NEGATIVE Final   Staphylococcus aureus NEGATIVE NEGATIVE Final    Comment: (NOTE) The Xpert SA Assay (FDA approved for NASAL specimens in patients 1 years of age and older), is one component of a comprehensive surveillance program. It is not intended to diagnose infection nor to guide or monitor treatment.     Time coordinating discharge: 38 mins  SIGNED:  Irwin Brakeman, MD  [facility] 07/18/2017, 12:50 PM Pager [phone]  If 7PM-7AM, please contact night-coverage [email or web address] Password [email or web address]

## 2017-07-18 NOTE — Progress Notes (Signed)
Podiatry Progress Note  Subjective Timothy Clark is POD 2 S/P debridement of ulceration of right foot wound.  Dr. Wynetta Emery related that patient wanted to leave hospital last night.  Father at bedside.  Objective Vital signs in last 24 hours:   Temp:  [97.4 F (36.3 C)-99.7 F (37.6 C)] 98.4 F (36.9 C) (12/08 0930) Pulse Rate:  [66-93] 86 (12/08 0930) Resp:  [18-24] 18 (12/08 0930) BP: (120-183)/(59-107) 130/69 (12/08 0930) SpO2:  [97 %-100 %] 98 % (12/08 0930)  DRESSING:  Intact with pink strikethrough consistent with serosanguinous exudate. INTEGUMENT:  Full thickness ulceration along dorsomedial aspect of the right foot.  Wound bed pink and viable.  Packing in place.  Packing removed.    Lab/Test Results  Recent Labs    07/17/17 1053 07/18/17 0815  WBC 10.2 8.8  HGB 10.5* 10.3*  HCT 31.0* 30.3*  PLT 142* 136*  NA 134* 137  K 3.3* 3.0*  CL 103 106  CO2 24 24  BUN 29* 23*  CREATININE 1.95* 1.79*  GLUCOSE 245* 139*  CALCIUM 8.2* 8.1*    Recent Results (from the past 240 hour(s))  Blood Culture (routine x 2)     Status: None (Preliminary result)   Collection Time: 07/14/17  8:18 PM  Result Value Ref Range Status   Specimen Description BLOOD RIGHT HAND  Final   Special Requests   Final    BOTTLES DRAWN AEROBIC AND ANAEROBIC Blood Culture results may not be optimal due to an inadequate volume of blood received in culture bottles   Culture NO GROWTH 4 DAYS  Final   Report Status PENDING  Incomplete  Blood Culture (routine x 2)     Status: None (Preliminary result)   Collection Time: 07/14/17  8:18 PM  Result Value Ref Range Status   Specimen Description BLOOD RIGHT FOREARM  Final   Special Requests   Final    BOTTLES DRAWN AEROBIC AND ANAEROBIC Blood Culture results may not be optimal due to an inadequate volume of blood received in culture bottles   Culture NO GROWTH 4 DAYS  Final   Report Status PENDING  Incomplete  Surgical pcr screen     Status: None    Collection Time: 07/15/17  2:35 PM  Result Value Ref Range Status   MRSA, PCR NEGATIVE NEGATIVE Final   Staphylococcus aureus NEGATIVE NEGATIVE Final    Comment: (NOTE) The Xpert SA Assay (FDA approved for NASAL specimens in patients 70 years of age and older), is one component of a comprehensive surveillance program. It is not intended to diagnose infection nor to guide or monitor treatment.      Mr Brain Wo Contrast  Result Date: 07/17/2017 CLINICAL DATA:  Initial evaluation for acute confusion, and aphasia. EXAM: MRI HEAD WITHOUT CONTRAST MRA HEAD WITHOUT CONTRAST TECHNIQUE: Multiplanar, multiecho pulse sequences of the brain and surrounding structures were obtained without intravenous contrast. Angiographic images of the head were obtained using MRA technique without contrast. COMPARISON:  Prior CT from earlier the same day. FINDINGS: MRI HEAD FINDINGS Brain: Cerebral volume within normal limits for patient age. No focal parenchymal signal abnormality identified. No abnormal foci of restricted diffusion to suggest acute or subacute ischemia. Gray-white matter differentiation well maintained. No encephalomalacia to suggest chronic infarction. No foci of susceptibility artifact to suggest acute or chronic intracranial hemorrhage. No mass lesion, midline shift or mass effect. No hydrocephalus. No extra-axial fluid collection. Major dural sinuses are grossly patent. Pituitary gland and suprasellar region are normal.  Midline structures intact and normal. Vascular: Major intracranial vascular flow voids well maintained and normal in appearance. Skull and upper cervical spine: Craniocervical junction normal. Visualized upper cervical spine within normal limits. Bone marrow signal intensity normal. No scalp soft tissue abnormality. Sinuses/Orbits: Globes and orbital soft tissues demonstrate no acute abnormality. 2 cm ovoid cystic lesion at the superolateral aspect of the left globe, indeterminate, but  stable relative to multiple previous exams dating back to 2016. Patient status post lens extraction bilaterally. Paranasal sinuses are clear. Bilateral mastoid effusions noted. Inner ear structures normal. Other: None. MRA HEAD FINDINGS ANTERIOR CIRCULATION: Internal carotid artery is widely patent to the level of the termini without stenosis. A1 segments widely patent. Normal anterior communicating artery. Anterior cerebral arteries widely patent to their distal aspects without stenosis. M1 segments patent without stenosis or occlusion. Right M1 segment bifurcates early. Distal MCA branches well perfused and symmetric. POSTERIOR CIRCULATION: Vertebral arteries widely patent to the vertebrobasilar junction. Right vertebral artery slightly dominant. Short fenestration noted at the proximal basilar artery. Basilar artery widely patent to its distal aspect. Superior cerebral arteries patent bilaterally. Posterior cerebral arteries largely supplied via the basilar and are well perfused to their distal aspects. Small bilateral posterior communicating arteries noted. No aneurysm. IMPRESSION: MRI HEAD IMPRESSION: 1. Normal brain MRI.  No acute intracranial abnormality identified. 2. Bilateral mastoid effusions. MRA HEAD IMPRESSION: Normal intracranial MRA. Electronically Signed   By: Jeannine Boga M.D.   On: 07/17/2017 13:49   US Carotid Bilateral (at [facility] And [facility] Only)  Result Date: 07/17/2017 CLINICAL DATA:  Stroke symptoms, ataxia, hypertension, visual disturbance, diabetes EXAM: BILATERAL CAROTID DUPLEX ULTRASOUND TECHNIQUE: Pearline Cables scale imaging, color Doppler and duplex ultrasound were performed of bilateral carotid and vertebral arteries in the neck. COMPARISON:  None avail FINDINGS: Criteria: Quantification of carotid stenosis is based on velocity parameters that correlate the residual internal carotid diameter with NASCET-based stenosis levels, using the diameter of the distal internal carotid lumen as the  denominator for stenosis measurement. The following velocity measurements were obtained: RIGHT ICA:  80/21 cm/sec CCA:  509/32 cm/sec SYSTOLIC ICA/CCA RATIO:  0.8 DIASTOLIC ICA/CCA RATIO:  0.9 ECA:  122 cm/sec LEFT ICA:  117/25 cm/sec CCA:  67/12 cm/sec SYSTOLIC ICA/CCA RATIO:  1.2 DIASTOLIC ICA/CCA RATIO:  1.0 ECA:  160 cm/sec RIGHT CAROTID ARTERY: Minor echogenic shadowing plaque formation. No hemodynamically significant right ICA stenosis, velocity elevation, or turbulent flow. Degree of narrowing less than 50%. RIGHT VERTEBRAL ARTERY:  Antegrade LEFT CAROTID ARTERY: Similar scattered minor echogenic plaque formation. No hemodynamically significant left ICA stenosis, velocity elevation, or turbulent flow. LEFT VERTEBRAL ARTERY:  Antegrade IMPRESSION: Minor carotid atherosclerosis. No hemodynamically significant ICA stenosis. Degree of narrowing less than 50% bilaterally by ultrasound criteria. Patent antegrade vertebral flow bilaterally Electronically Signed   By: Jerilynn Mages.  Shick M.D.   On: 07/17/2017 14:16   Mr Jodene Nam Head Wo Contrast  Result Date: 07/17/2017 CLINICAL DATA:  Initial evaluation for acute confusion, and aphasia. EXAM: MRI HEAD WITHOUT CONTRAST MRA HEAD WITHOUT CONTRAST TECHNIQUE: Multiplanar, multiecho pulse sequences of the brain and surrounding structures were obtained without intravenous contrast. Angiographic images of the head were obtained using MRA technique without contrast. COMPARISON:  Prior CT from earlier the same day. FINDINGS: MRI HEAD FINDINGS Brain: Cerebral volume within normal limits for patient age. No focal parenchymal signal abnormality identified. No abnormal foci of restricted diffusion to suggest acute or subacute ischemia. Gray-white matter differentiation well maintained. No encephalomalacia to suggest chronic infarction. No foci  of susceptibility artifact to suggest acute or chronic intracranial hemorrhage. No mass lesion, midline shift or mass effect. No hydrocephalus. No  extra-axial fluid collection. Major dural sinuses are grossly patent. Pituitary gland and suprasellar region are normal. Midline structures intact and normal. Vascular: Major intracranial vascular flow voids well maintained and normal in appearance. Skull and upper cervical spine: Craniocervical junction normal. Visualized upper cervical spine within normal limits. Bone marrow signal intensity normal. No scalp soft tissue abnormality. Sinuses/Orbits: Globes and orbital soft tissues demonstrate no acute abnormality. 2 cm ovoid cystic lesion at the superolateral aspect of the left globe, indeterminate, but stable relative to multiple previous exams dating back to 2016. Patient status post lens extraction bilaterally. Paranasal sinuses are clear. Bilateral mastoid effusions noted. Inner ear structures normal. Other: None. MRA HEAD FINDINGS ANTERIOR CIRCULATION: Internal carotid artery is widely patent to the level of the termini without stenosis. A1 segments widely patent. Normal anterior communicating artery. Anterior cerebral arteries widely patent to their distal aspects without stenosis. M1 segments patent without stenosis or occlusion. Right M1 segment bifurcates early. Distal MCA branches well perfused and symmetric. POSTERIOR CIRCULATION: Vertebral arteries widely patent to the vertebrobasilar junction. Right vertebral artery slightly dominant. Short fenestration noted at the proximal basilar artery. Basilar artery widely patent to its distal aspect. Superior cerebral arteries patent bilaterally. Posterior cerebral arteries largely supplied via the basilar and are well perfused to their distal aspects. Small bilateral posterior communicating arteries noted. No aneurysm. IMPRESSION: MRI HEAD IMPRESSION: 1. Normal brain MRI.  No acute intracranial abnormality identified. 2. Bilateral mastoid effusions. MRA HEAD IMPRESSION: Normal intracranial MRA. Electronically Signed   By: Jeannine Boga M.D.   On:  07/17/2017 13:49   Ct Head Code Stroke Wo Contrast  Result Date: 07/17/2017 CLINICAL DATA:  Code stroke.  Ataxia.  Code stroke. EXAM: CT HEAD WITHOUT CONTRAST TECHNIQUE: Contiguous axial images were obtained from the base of the skull through the vertex without intravenous contrast. COMPARISON:  07/14/2017 FINDINGS: Brain: Normal appearance without evidence of atrophy, old or acute infarction, mass lesion, hemorrhage, hydrocephalus or extra-axial collection. Vascular: No abnormal vascular finding. Skull: Normal Sinuses/Orbits: Clear/normal. The patient does have fluid in the mastoid air cells on each side. Other: None ASPECTS ([place] Stroke Program Early CT Score) - Ganglionic level infarction (caudate, lentiform nuclei, internal capsule, insula, M1-M3 cortex): 7 - Supraganglionic infarction (M4-M6 cortex): 3 Total score (0-10 with 10 being normal): 10 IMPRESSION: 1. Normal appearance of the brain itself. 2. ASPECTS is 10. Bilateral mastoid effusions. These results were called by telephone at the time of interpretation on 07/17/2017 at 11:00 am to Dr. Irwin Brakeman , who verbally acknowledged these results. Electronically Signed   By: Nelson Chimes M.D.   On: 07/17/2017 11:03    Medications Scheduled Meds: . amLODipine  10 mg Oral Daily  . atorvastatin  10 mg Oral Daily  . brimonidine  1 drop Both Eyes BID  . dicyclomine  20 mg Oral TID AC & HS  . enoxaparin (LOVENOX) injection  70 mg Subcutaneous Q24H  . hydrocerin   Topical BID  . insulin aspart  0-20 Units Subcutaneous Q4H  . insulin aspart  8 Units Subcutaneous TID WC  . insulin glargine  25 Units Subcutaneous QHS  . metoCLOPramide  10 mg Oral TID AC  . metoprolol succinate  75 mg Oral Daily  . pantoprazole  40 mg Oral BID AC  . polyethylene glycol  17 g Oral BID  . potassium chloride  60 mEq  Oral Once  . QUEtiapine  50 mg Oral BID  . senna  2 tablet Oral BID  . sertraline  100 mg Oral Daily  . sucralfate  1 g Oral QID  . timolol  1  drop Both Eyes BID   Continuous Infusions: . sodium chloride 50 mL/hr at 07/17/17 1832  . aztreonam Stopped (07/18/17 0936)  . levofloxacin (LEVAQUIN) IV Stopped (07/17/17 2233)  . vancomycin Stopped (07/18/17 0554)   PRN Meds:.ALPRAZolam, gabapentin, hydrALAZINE, labetalol, LORazepam, ondansetron (ZOFRAN) IV  Assessment 1. S/P debridement of ulceration of right foot. 2.Osteomylitis first ray of right foot. 3.Diabetes mellitus with peripheral neuropathy.  Plan Dressing changed.  Wound packed with 1/2 Iodoform gauze.  Packing should be changed daily to every other day.  Wear surgical shoe on right foot.  Limit ambulation.  Stressed importance of promptly following up with Dr. Amalia Hailey.  The patient is to contact Dr. Amalia Hailey office Monday morning to schedule appointment.  Follow-up with ID as scheduled Monday.  Abx per hospitalist service.  Defer all further care to Dr. Amalia Hailey.  Kasir Hallenbeck IVAN 07/18/2017, 12:43 PM

## 2017-07-18 NOTE — Care Management Note (Signed)
Case Management Note  Patient Details  Name: Timothy Clark MRN: [id number] Date of Birth: 1970-11-25  Subjective/Objective:  Pt presents for wound  Treatment.  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  Pt agrees to PT also.  Pt aware he can pick up walker at [facility] store or have it mailed to home.  Pt prefers to have it mailed.                Action/Plan: LM with [facility] to resume HH.  Orders complete.  Walker to be delivered by mail order- information given to [place] with [place].    Expected Discharge Date:  07/18/17               Expected Discharge Plan:  [place]  In-House Referral:  NA  Discharge planning Services  CM Consult  Post Acute Care Choice:  Home Health Choice offered to:  Patient  DME Arranged:    DME Agency:     HH Arranged:  RN [place] Agency:  [place]  Status of Service:  Completed, signed off  If discussed at [place] of Stay Meetings, dates discussed:    Additional Comments:  Arley Phenix, RN 07/18/2017, 3:18 PM

## 2017-07-18 NOTE — Evaluation (Signed)
Physical Therapy Evaluation Patient Details Name: Timothy Clark MRN: [id number] DOB: Dec 10, 1970 Today's Date: 07/18/2017   History of Present Illness  46 yo male with onset of worsening of R foot diabetic ulcer, cellulitis and sepsis, very limited vision.  Has osteomyelitis on L foot at first ray, complicated by DM and PN.  Has surgical debridement done and wound dressing.  Clinical Impression  Pt was seen for evaluation of his mobility and initially with no WB permission was only able to get a few feet before PT had him back up to the bed.  He requires a walker but is now permitted to [facility] on RLE so should transition to home with family and HHPT to see him for strength and balance work.  Follow acutely if discharge is held.    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      Mobility  Bed Mobility Overal bed mobility: Modified Independent             General bed mobility comments: able to roll and sit up on side of bed  Transfers Overall transfer level: Needs assistance Equipment used: Rolling walker (2 wheeled);1 person hand held assist Transfers: Sit to/from Stand Sit to Stand: Min guard;Min assist            Ambulation/Gait Ambulation/Gait assistance: Min guard;Min assist Ambulation Distance (Feet): 6 Feet Assistive device: Rolling walker (2 wheeled);1 person hand held assist   Gait velocity: reduced Gait velocity interpretation: Below normal speed for age/gender General Gait Details: attempted to hop but pt can only walk with PWB on R foot  Stairs             Wheelchair Mobility    Modified Rankin (Stroke Patients Only)       Balance Overall balance assessment: Needs assistance Sitting-balance support: Feet supported Sitting balance-Leahy Scale: Good     Standing balance support: Bilateral upper extremity supported;During functional activity Standing balance-Leahy Scale: Fair                               Pertinent Vitals/Pain Pain Assessment: No/denies pain    Home Living Family/patient expects to be discharged to:: Private residence Living Arrangements: Children;Parent Available Help at Discharge: Family;Available 24 hours/day Type of Home: House Home Access: Stairs to enter   [employer] of Steps: 1 Home Layout: One level Home Equipment: Cane - single point(walked with no AD prior to the surgery)      Prior Function Level of Independence: Independent               Hand Dominance   Dominant Hand: Right    Extremity/Trunk Assessment   Upper Extremity Assessment Upper Extremity Assessment: Overall WFL for tasks assessed    Lower Extremity Assessment Lower Extremity Assessment: Overall WFL for tasks assessed(has minor loss of strength in hams and hip add)    Cervical / Trunk Assessment Cervical / Trunk Assessment: Normal  Communication   Communication: No difficulties  Cognition Arousal/Alertness: Awake/alert Behavior During Therapy: Flat affect Overall Cognitive Status: No family/caregiver present to determine baseline cognitive functioning  General Comments: pt has some spotty areas of memory loss      General Comments      Exercises     Assessment/Plan    PT Assessment Patient needs continued PT services  PT Problem List Decreased strength;Decreased range of motion;Decreased activity tolerance;Decreased balance;Decreased mobility;Decreased coordination;Decreased knowledge of use of DME;Decreased safety  awareness;Cardiopulmonary status limiting activity;Obesity;Decreased skin integrity;Pain       PT Treatment Interventions DME instruction;Gait training;Functional mobility training;Therapeutic activities;Therapeutic exercise;Balance training;Neuromuscular re-education;Patient/family education    PT Goals (Current goals can be found in the Care Plan section)  Acute Rehab PT Goals Patient Stated Goal: to get home  PT Goal Formulation: With patient Time For Goal Achievement: 08/01/17 Potential to Achieve Goals: Good    Frequency Min 3X/week   Barriers to discharge Other (comment) needs supervised gait at home    Co-evaluation               AM-PAC PT "6 Clicks" Daily Activity  Outcome Measure Difficulty turning over in bed (including adjusting bedclothes, sheets and blankets)?: A Little Difficulty moving from lying on back to sitting on the side of the bed? : A Little Difficulty sitting down on and standing up from a chair with arms (e.g., wheelchair, bedside commode, etc,.)?: Unable Help needed moving to and from a bed to chair (including a wheelchair)?: A Little Help needed walking in hospital room?: A Little Help needed climbing 3-5 steps with a railing? : A Little 6 Click Score: 16    End of Session Equipment Utilized During Treatment: Gait belt Activity Tolerance: Patient tolerated treatment well;Other (comment)(limited to be NWB on RLE but was then [facility] ) Patient left: in bed;with call bell/phone within reach;with nursing/sitter in room Nurse Communication: Mobility status PT Visit Diagnosis: Unsteadiness on feet (R26.81);Muscle weakness (generalized) (M62.81);Difficulty in walking, not elsewhere classified (R26.2)    Time: 9924-2683 PT Time Calculation (min) (ACUTE ONLY): 23 min   Charges:   PT Evaluation $PT Eval Moderate Complexity: 1 Mod PT Treatments $Gait Training: 8-22 mins   PT G Codes:   PT G-Codes **NOT FOR INPATIENT CLASS** Functional Assessment  Tool Used: AM-PAC 6 Clicks Basic Mobility   Ramond Dial 07/18/2017, 3:23 PM   3:25 PM, 07/18/17 Mee Hives, PT, MS Physical Therapist - [place] [phone] (Office)

## 2017-07-18 NOTE — Progress Notes (Signed)
Pt states "I can't sleep with this on" in reference to CPAP. Educated pt on use of CPAP, pt requests to have it removed. Removed per pt request.

## 2017-07-19 LAB — CULTURE, BLOOD (ROUTINE X 2)
Culture: NO GROWTH
Culture: NO GROWTH

## 2017-07-20 ENCOUNTER — Ambulatory Visit: Payer: Medicare Other | Admitting: Infectious Diseases

## 2017-07-21 DIAGNOSIS — L97518 Non-pressure chronic ulcer of other part of right foot with other specified severity: Secondary | ICD-10-CM | POA: Diagnosis not present

## 2017-07-21 DIAGNOSIS — E11621 Type 2 diabetes mellitus with foot ulcer: Secondary | ICD-10-CM | POA: Diagnosis not present

## 2017-07-21 DIAGNOSIS — M86671 Other chronic osteomyelitis, right ankle and foot: Secondary | ICD-10-CM | POA: Diagnosis not present

## 2017-07-21 DIAGNOSIS — L97513 Non-pressure chronic ulcer of other part of right foot with necrosis of muscle: Secondary | ICD-10-CM | POA: Diagnosis not present

## 2017-07-22 DIAGNOSIS — L97518 Non-pressure chronic ulcer of other part of right foot with other specified severity: Secondary | ICD-10-CM | POA: Diagnosis not present

## 2017-07-22 DIAGNOSIS — M868X7 Other osteomyelitis, ankle and foot: Secondary | ICD-10-CM | POA: Diagnosis not present

## 2017-07-22 DIAGNOSIS — E11621 Type 2 diabetes mellitus with foot ulcer: Secondary | ICD-10-CM | POA: Diagnosis not present

## 2017-07-23 DIAGNOSIS — R0989 Other specified symptoms and signs involving the circulatory and respiratory systems: Secondary | ICD-10-CM | POA: Diagnosis not present

## 2017-07-23 DIAGNOSIS — R140 Abdominal distension (gaseous): Secondary | ICD-10-CM | POA: Diagnosis not present

## 2017-07-23 DIAGNOSIS — R1013 Epigastric pain: Secondary | ICD-10-CM | POA: Diagnosis not present

## 2017-07-23 DIAGNOSIS — M86671 Other chronic osteomyelitis, right ankle and foot: Secondary | ICD-10-CM | POA: Diagnosis not present

## 2017-07-23 DIAGNOSIS — E11621 Type 2 diabetes mellitus with foot ulcer: Secondary | ICD-10-CM | POA: Diagnosis not present

## 2017-07-23 DIAGNOSIS — R0789 Other chest pain: Secondary | ICD-10-CM | POA: Diagnosis not present

## 2017-07-23 DIAGNOSIS — Z888 Allergy status to other drugs, medicaments and biological substances status: Secondary | ICD-10-CM | POA: Diagnosis not present

## 2017-07-23 DIAGNOSIS — R0602 Shortness of breath: Secondary | ICD-10-CM | POA: Diagnosis not present

## 2017-07-23 DIAGNOSIS — R109 Unspecified abdominal pain: Secondary | ICD-10-CM | POA: Diagnosis not present

## 2017-07-23 DIAGNOSIS — L97513 Non-pressure chronic ulcer of other part of right foot with necrosis of muscle: Secondary | ICD-10-CM | POA: Diagnosis not present

## 2017-07-23 DIAGNOSIS — S4991XA Unspecified injury of right shoulder and upper arm, initial encounter: Secondary | ICD-10-CM | POA: Diagnosis not present

## 2017-07-23 DIAGNOSIS — Z880 Allergy status to penicillin: Secondary | ICD-10-CM | POA: Diagnosis not present

## 2017-07-23 DIAGNOSIS — K3184 Gastroparesis: Secondary | ICD-10-CM | POA: Diagnosis not present

## 2017-07-23 DIAGNOSIS — M868X7 Other osteomyelitis, ankle and foot: Secondary | ICD-10-CM | POA: Diagnosis not present

## 2017-07-23 DIAGNOSIS — M25511 Pain in right shoulder: Secondary | ICD-10-CM | POA: Diagnosis not present

## 2017-07-23 DIAGNOSIS — L97518 Non-pressure chronic ulcer of other part of right foot with other specified severity: Secondary | ICD-10-CM | POA: Diagnosis not present

## 2017-07-23 DIAGNOSIS — I10 Essential (primary) hypertension: Secondary | ICD-10-CM | POA: Diagnosis not present

## 2017-07-27 ENCOUNTER — Ambulatory Visit (INDEPENDENT_AMBULATORY_CARE_PROVIDER_SITE_OTHER): Payer: Medicare Other | Admitting: Infectious Diseases

## 2017-07-27 VITALS — Wt 297.0 lb

## 2017-07-27 DIAGNOSIS — M869 Osteomyelitis, unspecified: Secondary | ICD-10-CM

## 2017-07-27 DIAGNOSIS — K3184 Gastroparesis: Secondary | ICD-10-CM

## 2017-07-27 DIAGNOSIS — L97513 Non-pressure chronic ulcer of other part of right foot with necrosis of muscle: Secondary | ICD-10-CM | POA: Diagnosis not present

## 2017-07-27 DIAGNOSIS — E11621 Type 2 diabetes mellitus with foot ulcer: Secondary | ICD-10-CM | POA: Diagnosis not present

## 2017-07-27 DIAGNOSIS — I739 Peripheral vascular disease, unspecified: Secondary | ICD-10-CM | POA: Diagnosis not present

## 2017-07-27 DIAGNOSIS — E1143 Type 2 diabetes mellitus with diabetic autonomic (poly)neuropathy: Secondary | ICD-10-CM | POA: Diagnosis not present

## 2017-07-27 DIAGNOSIS — M86671 Other chronic osteomyelitis, right ankle and foot: Secondary | ICD-10-CM | POA: Diagnosis not present

## 2017-07-27 DIAGNOSIS — L97518 Non-pressure chronic ulcer of other part of right foot with other specified severity: Secondary | ICD-10-CM | POA: Diagnosis not present

## 2017-07-27 NOTE — Assessment & Plan Note (Addendum)
His MRI results are concerning for acute osteomyelitis/septic arthritis and now with hospital admission for sepsis r/t this. Clearly PO antibiotics are not working for him as he is intolerant to several different regimens d/t gastroparesis and allergies. He would benefit from prolonged IV antibiotics for treatment of this, to which he states again he is not able to accommodate this at home and it is not a doable option for him. He is very fearful of amputation and seems to be hanging on to the fact that the wound care clinic reported it is better recently. Will not let me see wound today as he just had it wrapped. He will call me back so we can coordinate another office visit soon so we can see the wound. Continue current course of Levaquin and further plan for treatment pending.   I have placed urgent vascular evaluation considering he had non-compressible vessels on recent ABIs. Whether he has amputation or IV antibiotics this will not be resolved without adequate blood flow and HBO is not effective if there is not a clear pass for the oxygenated blood to travel to the wound.   I will reach out to Dr. Phoebe Perch office to see if he can be seen sooner than scheduled as I believe Mr. Whedbee would like his opinion regarding recent MRI results and need for further surgery. We are very limited in our options as he is not tolerant of PO abx, not accepting of IV option and will not consider amputation at this time - I informed him that I was very worried about the progression we have seen and his risk for recurrent and metastatic infections.

## 2017-07-27 NOTE — Progress Notes (Signed)
Timothy Clark 01/14/1971 [id number] PCP: Tawni Carnes, PA-C   Referring Provider: Zada Girt ([place])  Reason for Visit: osteomyelitis of right foot   Brief Narrative: Ford is a 46 y.o. male diabetic patient that has had chronic ulcerations to the right foot and now osteomyelitis to the right forefoot/MTP#1. He has had a chronic ulcer involving the superior right hallux since approx March 2018; previously with ulcer to ball of his foot about a year ago that is now healed. He has in the past been on IV antibiotics in the past however unable to accommodate this at home d/t inconsistency of caregiver support. Unable to go to skilled facility d/t financial concern. He is ambulatory and has been working with outpatient wound center and receiving HBO tx.    HPI:  Interval history noted of 5 day hospitalization at [facility] after he presented to ED with exacerbation of his gastroparesis (which is the 3rd flare he has had since trying PO antibiotics for his osteomyelitis) and was found to be febrile ~100 deg, elevated wbc count and cellulitis of this right foot; of note he had previously had this site debrided earlier that day and he feels he had more of a 'cold' that was causing his fevers. BCx were negative at that time. MRI was repeated for concern over osteomyelitis and does show acute inflammatory changes to the bone marrow, worsening bone degradation and new joint effusion at the MTP#1. He was given aztreonam, vancomycin and Levaquin IV and underwent surgical debridement by podiatry. He left the hospital against medical advice after he was recommended amputation of the toe and declined PICC line. Sent home with 14 days more of PO Levaquin x 14d to which he has also been taking the ciprofloxacin with this. Unable to tolerate doxycycline at all and has not taken this since last OV.   Op Note: 07/16/17: sharp excisional debridement of ulceration performed  with tunneling noted at 2.8 cm from 12 to 6 o'clock position.   Since discharge he has started in care with a new wound clinic (Dr. Marcelino Scot) and has continued with HBO treatments. Reports he had his wound care done recently and unable to unwrap this today. Has 5 days of Levaquin left. He tells me the "redness is gone." No further fevers/chills.   Review of Systems  Constitutional: Negative for chills and fever.  Respiratory: Negative for cough and sputum production.   Cardiovascular: Positive for leg swelling. Negative for chest pain.  Gastrointestinal: Negative for abdominal pain, diarrhea and vomiting.  Genitourinary: Negative for dysuria and flank pain.  Musculoskeletal: Negative for joint pain and myalgias.       Pain and swelling to left lateral upper wrist/forearm area from previous IV site.   Skin: Negative for rash.  Neurological: Negative for dizziness, tingling and headaches.  Endo/Heme/Allergies: Negative for polydipsia.    Past Medical History:  Diagnosis Date  . Chronic abdominal pain   . Esophagitis   . Eye hemorrhage   . Gastritis   . Gastroparesis   . GERD (gastroesophageal reflux disease)   . Glaucoma   . Helicobacter pylori (H. pylori) infection 2016   ERADICATION DOCUMENTED VIA STOOL TEST in AUG 2016 at [facility]  . Hiccups   . Hypertension   . Nausea and vomiting    chronic, recurrent  . Type 2 diabetes mellitus with complications (HCC)    diagnosed around age 45    Allergies  Allergen Reactions  . Donnatal [Pb-Hyoscy-Atropine-Scopol  Er] Anaphylaxis    Reaction to GI cocktail  . Fish Allergy Anaphylaxis, Hives and Rash  . Haldol [Haloperidol Lactate] Other (See Comments)    Chest Pain  . Lidocaine Anaphylaxis    Reaction to GI cocktail  . Maalox [Calcium Carbonate Antacid] Anaphylaxis    Reaction to GI cocktail  . Aspirin Hives and Itching  . Bactrim [Sulfamethoxazole-Trimethoprim] Itching  . Keflex [Cephalexin] Hives, Itching and Nausea And Vomiting    . Penicillins Hives and Itching    Has patient had a PCN reaction causing immediate rash, facial/tongue/throat swelling, SOB or lightheadedness with hypotension: yes Has patient had a PCN reaction causing severe rash involving mucus membranes or skin necrosis: yes Has patient had a PCN reaction that required hospitalization no Has patient had a PCN reaction occurring within the last 10 years: yes If all of the above answers are "NO", then may proceed with Cephalospor  . Phenergan [Promethazine Hcl] Nausea And Vomiting    OBJECTIVE: Vitals:   07/27/17 1431  Weight: 297 lb (134.7 kg)   Body mass index is 40.28 kg/m.  Physical Exam  Constitutional: He is oriented to person, place, and time and well-developed, well-nourished, and in no distress.  HENT:  Mouth/Throat: Oropharynx is clear and moist.  Eyes:  Right eye with outward gaze (chronic)  Cardiovascular: Normal rate, normal heart sounds and intact distal pulses.  No murmur heard. Pulmonary/Chest: Effort normal and breath sounds normal.  Abdominal: Soft. Bowel sounds are normal. He exhibits no distension.  Musculoskeletal:  Right Foot - wrapped to knee and in orthopedic boot today.   Neurological: He is alert and oriented to person, place, and time.  Skin: Skin is warm and dry.  Psychiatric: Affect normal.  Vitals reviewed.   LABS & DIAGNOSTICS:  MRI 02/25/17 Right Forefoot W/O Contrast: Soft tissue ulceration involving the medial aspect of forefoot at the level of the first MTP joint. There is underlying osteomyelitis involving the first metatarsal head. I do not see any definite findings for septic arthritis. Moderate degenerative changes noted. Surrounding cellulitis w/o myofasciitis w/o drainable soft tissue abscess or pyomyositis. The other bony structures appear intact. The medial and lateral sesamoids of the great toe are intact.  Bone biopsy 04/30/17 - resolving chronic osteomyelitis, no culture.   MRI 07/13/2017 Right  Forefoot W/O Contrast: bone marrow edema in the distal 4cm of the first metatarsal, throughout the proximal phalanx of the great toe and in both the medical and lateral sesamoids of the first MTP joint. Bony destructive changes along the lateral cortex is seen. MTP#1 joint effusion. Edema throughout the intrinsic musculature of the foot. No intramuscular fluid collection seen. Fluid collection in the first intermetatarsal space 1.3 cm x 0.5 cm x 1 cm long.   Sed Rate  Date Value  07/15/2017 73 mm/hr (H)  06/22/2017 72 mm/h (H)  04/06/2017 74 mm/hr (H)   CRP  Date Value  07/15/2017 <0.8 mg/dL  06/22/2017 14.3 mg/L (H)  04/06/2017 2.1 mg/L    ASSESSMENT & PLAN:  Problem List Items Addressed This Visit      Digestive   Gastroparesis due to DM [facility])     Musculoskeletal and Integument   DIABETIC FOOT ULCER with Osteomyelitis    His MRI results are concerning for acute osteomyelitis/septic arthritis and now with hospital admission for sepsis r/t this. Clearly PO antibiotics are not working for him as he is intolerant to several different regimens d/t gastroparesis and allergies. He would benefit from prolonged IV antibiotics  for treatment of this, to which he states again he is not able to accommodate this at home and it is not a doable option for him. He is very fearful of amputation and seems to be hanging on to the fact that the wound care clinic reported it is better recently. Will not let me see wound today as he just had it wrapped. He will call me back so we can coordinate another office visit soon so we can see the wound. Continue current course of Levaquin and further plan for treatment pending.   I have placed urgent vascular evaluation considering he had non-compressible vessels on recent ABIs. Whether he has amputation or IV antibiotics this will not be resolved without adequate blood flow and HBO is not effective if there is not a clear pass for the oxygenated blood to travel to the  wound.   I will reach out to Dr. Phoebe Perch office to see if he can be seen sooner than scheduled as I believe Mr. Danzy would like his opinion regarding recent MRI results and need for further surgery. We are very limited in our options as he is not tolerant of PO abx, not accepting of IV option and will not consider amputation at this time - I informed him that I was very worried about the progression we have seen and his risk for recurrent and metastatic infections.        Other Visit Diagnoses    PAD (peripheral artery disease) ([place])    -  Primary   Relevant Orders   Ambulatory referral to Vascular Surgery     I spent  25 minutes with the patient including greater than 50% of time in face to face counsel of the patient re wound care, antibiotic discussion, PIV infiltrate and in coordination of their care.  [name], MSN, [facility]  05/25/2017

## 2017-07-27 NOTE — Patient Instructions (Addendum)
Will get you in for vascular evaluation and will see if we can get you in to see Dr. Amalia Hailey sooner for a second opinion.   Please continue taking your Levofloxacin until this is gone.   Call us about when you can come back so I can see your foot.

## 2017-07-29 ENCOUNTER — Ambulatory Visit (INDEPENDENT_AMBULATORY_CARE_PROVIDER_SITE_OTHER): Payer: Medicare Other | Admitting: Podiatry

## 2017-07-29 DIAGNOSIS — L97512 Non-pressure chronic ulcer of other part of right foot with fat layer exposed: Secondary | ICD-10-CM

## 2017-07-29 DIAGNOSIS — E0842 Diabetes mellitus due to underlying condition with diabetic polyneuropathy: Secondary | ICD-10-CM

## 2017-07-29 DIAGNOSIS — I70235 Atherosclerosis of native arteries of right leg with ulceration of other part of foot: Secondary | ICD-10-CM

## 2017-07-29 DIAGNOSIS — M86671 Other chronic osteomyelitis, right ankle and foot: Secondary | ICD-10-CM

## 2017-07-30 DIAGNOSIS — E11621 Type 2 diabetes mellitus with foot ulcer: Secondary | ICD-10-CM | POA: Diagnosis not present

## 2017-07-30 DIAGNOSIS — E11319 Type 2 diabetes mellitus with unspecified diabetic retinopathy without macular edema: Secondary | ICD-10-CM | POA: Diagnosis not present

## 2017-07-30 DIAGNOSIS — L97511 Non-pressure chronic ulcer of other part of right foot limited to breakdown of skin: Secondary | ICD-10-CM | POA: Diagnosis not present

## 2017-07-30 DIAGNOSIS — Z794 Long term (current) use of insulin: Secondary | ICD-10-CM | POA: Diagnosis not present

## 2017-08-01 NOTE — Progress Notes (Signed)
HPI: 46 year old male history of diabetes mellitus presents today for follow-up evaluation and treatment regarding an ulcer to the right foot.  Today the ulcer appears to be somewhat worse with a heavy amount of drainage and necrotic tissue noted overlying the first metatarsophalangeal joint of the right foot.  The patient is currently being treated in [facility] wound care center.  Patient was last seen there on 06/23/2017.  The patient is also being seen by infectious disease Dr. [name].  The patient was recently sent to [facility] emergency department where he was evaluated and MRI was performed on 07/15/2017.  MRI findings consistent with osteomyelitis throughout the great toe.  He presents today for surgical consultation regarding amputation of the great toe right foot.  Past Medical History:  Diagnosis Date  . Chronic abdominal pain   . Esophagitis   . Eye hemorrhage   . Gastritis   . Gastroparesis   . GERD (gastroesophageal reflux disease)   . Glaucoma   . Helicobacter pylori (H. pylori) infection 2016   ERADICATION DOCUMENTED VIA STOOL TEST in AUG 2016 at [place]  . Hiccups   . Hypertension   . Nausea and vomiting    chronic, recurrent  . Type 2 diabetes mellitus with complications ([place])    diagnosed around age 73     Physical Exam: General: The patient is alert and oriented x3 in no acute distress.  Dermatology: Wound #1 noted overlying the first metatarsal phalangeal joint right foot measuring approximately 2.5 x 1.0 x 0.5 cm.  To the noted ulceration there is a heavy amount of serous drainage noted.  Wound base is mostly fibrotic.  There is no malodor.  Purulent integrity is intact.  There is exposed bone.  The ulceration does probe down to the level of bone.   Skin is warm, dry and supple bilateral lower extremities.   Vascular: Palpable pedal pulses bilaterally. No edema or erythema noted. Capillary refill within normal limits.  Neurological: Epicritic  and protective threshold absent bilaterally.   Musculoskeletal Exam: Range of motion within normal limits to all pedal and ankle joints bilateral. Muscle strength 5/5 in all groups bilateral.   MRI impression 07/15/2017 Markedly worsened appearance of the patient's right foot since the comparison MRI. Abnormal marrow signal in the distal 4 cm of the first metatarsal, throughout the proximal phalanx of the great toe and in both the medial and lateral sesamoids of the first MTP joint is consistent with osteomyelitis. First MTP joint effusion is likely due to septic joint.  Intense subcutaneous edema over the foot consistent with cellulitis.  Fluid in the first intermetatarsal space is consistent with bursitis, possibly septic. Large skin ulceration adjacent to the first metatarsal head is noted.  Assessment: -Osteomyelitis right great toe and first metatarsal -Septic first metatarsophalangeal joint with joint effusion - Ulcer right foot secondary to diabetes mellitus   Plan of Care:  -Patient was evaluated today.  MRI findings were reviewed again today with the patient.  The patient's sister was present today during evaluation -Medically necessary excisional debridement including muscle and deep fascial tissue was performed of the ulceration site.  Excisional debridement of all the necrotic nonviable tissue down to the healthy bleeding viable tissue was performed with post debridement measurement same as pre-. -Patient has an appointment for vascular studies to evaluate peripheral circulation -Today we discussed the need for partial first ray amputation with incision and drainage of the right foot.  All possible complications and details of  the procedure were explained.  All patient questions were answered.  No guarantees were expressed or implied. -Authorization for surgery initiated today.  Surgery will consist of partial first ray amputation right foot.  Incision and drainage right  foot. -Return to clinic 1 week postop   Edrick Kins, DPM [facility]  Dr. Edrick Kins, DPM    [address], [place] [address]                Office [phone]  Fax [phone]

## 2017-08-03 ENCOUNTER — Encounter: Payer: Self-pay | Admitting: Cardiovascular Disease

## 2017-08-03 ENCOUNTER — Ambulatory Visit (INDEPENDENT_AMBULATORY_CARE_PROVIDER_SITE_OTHER): Payer: Medicare Other | Admitting: Cardiovascular Disease

## 2017-08-03 VITALS — BP 150/84 | HR 80 | Ht 72.0 in | Wt 299.0 lb

## 2017-08-03 DIAGNOSIS — E118 Type 2 diabetes mellitus with unspecified complications: Secondary | ICD-10-CM

## 2017-08-03 DIAGNOSIS — E785 Hyperlipidemia, unspecified: Secondary | ICD-10-CM

## 2017-08-03 DIAGNOSIS — I10 Essential (primary) hypertension: Secondary | ICD-10-CM | POA: Diagnosis not present

## 2017-08-03 DIAGNOSIS — Z9289 Personal history of other medical treatment: Secondary | ICD-10-CM

## 2017-08-03 DIAGNOSIS — I5189 Other ill-defined heart diseases: Secondary | ICD-10-CM | POA: Diagnosis not present

## 2017-08-03 DIAGNOSIS — I739 Peripheral vascular disease, unspecified: Secondary | ICD-10-CM

## 2017-08-03 DIAGNOSIS — Z794 Long term (current) use of insulin: Secondary | ICD-10-CM | POA: Diagnosis not present

## 2017-08-03 NOTE — Patient Instructions (Signed)
Medication Instructions:  Continue all current medications.  Labwork: none  Testing/Procedures: none  Follow-Up: As needed  Any Other Special Instructions Will Be Listed Below (If Applicable). You have been referred to:  Dr. Quay Burow   If you need a refill on your cardiac medications before your next appointment, please call your pharmacy.

## 2017-08-03 NOTE — Progress Notes (Signed)
CARDIOLOGY CONSULT NOTE  Patient ID: Timothy Clark MRN: [id number] DOB/AGE: 1970/11/05 46 y.o.  Admit date: (Not on file) Primary Physician: Tawni Carnes, PA-C Referring Physician: [name] NP  Reason for Consultation: Peripheral vascular disease  HPI: Timothy Clark is a 46 y.o. male who is being seen today for the evaluation of peripheral vascular disease at the request of [place] Callas, NP.   Past medical history includes insulin-dependent diabetes, hyperlipidemia, and peripheral vascular disease.  He has chronic ulcerations of the right foot and osteomyelitis of the right forefoot.  He was hospitalized and discharged earlier this month for sepsis secondary to cellulitis and osteomy foot.  He apparently declined amputation.  There was some suspicion for strokelike symptoms which were finally deemed to be psychosomatic.  Echocardiogram 07/17/17 demonstrated normal left ventricular systolic function and regional wall motion, LVEF 28-36%, grade 2 diastolic dysfunction with elevated filling pressures.  ABIs 07/15/17 could not be obtained secondary to vessel noncompressibility.  There were normal arterial waveforms in the posterior tibial and dorsalis pedis arteries bilaterally.  He saw his podiatrist, Dr. Amalia Hailey, who recommends partial amputation with incision and drainage of the right foot.  ECG performed on 07/17/17 which I personally interpreted demonstrated normal sinus rhythm with PVCs.  He denies exertional chest pain and shortness of breath as well as palpitations and paroxysmal nocturnal dyspnea.  He uses compression stockings for leg swelling.  He occasionally has right thigh intermittent sharp and stabbing pains occurring about 2 or 3 times per week.  He denies claudication pain.  He has a visiting nurse and blood pressure last week was 120/70.  BUN 23, creatinine 1.79 on 07/18/17.   Allergies  Allergen Reactions  . Donnatal  [Pb-Hyoscy-Atropine-Scopol Er] Anaphylaxis    Reaction to GI cocktail  . Fish Allergy Anaphylaxis, Hives and Rash  . Haldol [Haloperidol Lactate] Other (See Comments)    Chest Pain  . Lidocaine Anaphylaxis    Reaction to GI cocktail  . Maalox [Calcium Carbonate Antacid] Anaphylaxis    Reaction to GI cocktail  . Aspirin Hives and Itching  . Bactrim [Sulfamethoxazole-Trimethoprim] Itching  . Keflex [Cephalexin] Hives, Itching and Nausea And Vomiting  . Penicillins Hives and Itching    Has patient had a PCN reaction causing immediate rash, facial/tongue/throat swelling, SOB or lightheadedness with hypotension: yes Has patient had a PCN reaction causing severe rash involving mucus membranes or skin necrosis: yes Has patient had a PCN reaction that required hospitalization no Has patient had a PCN reaction occurring within the last 10 years: yes If all of the above answers are "NO", then may proceed with Cephalospor  . Phenergan [Promethazine Hcl] Nausea And Vomiting    Current Outpatient Medications  Medication Sig Dispense Refill  . amLODipine (NORVASC) 5 MG tablet Take 1 tablet (5 mg total) by mouth daily. 30 tablet 0  . atorvastatin (LIPITOR) 10 MG tablet Take 10 mg by mouth daily.    . brimonidine (ALPHAGAN) 0.15 % ophthalmic solution Place 1 drop into both eyes 2 (two) times daily.     . cetirizine (ZYRTEC) 10 MG tablet Take 10 mg by mouth daily.    . cyclobenzaprine (FLEXERIL) 10 MG tablet Take 10 mg by mouth at bedtime as needed for muscle spasms.     [name] dicyclomine (BENTYL) 10 MG capsule Take 1 capsule (10 mg total) by mouth 4 (four) times daily -  before meals and at bedtime. 120 capsule 5  . gabapentin (NEURONTIN)  600 MG tablet Take 600 mg by mouth 3 (three) times daily with meals as needed.    . hydrochlorothiazide (HYDRODIURIL) 12.5 MG tablet Take 1 tablet (12.5 mg total) by mouth daily. 30 tablet 0  . insulin detemir (LEVEMIR) 100 UNIT/ML injection Inject 10 Units into the skin  at bedtime.     . insulin NPH-regular Human (NOVOLIN 70/30) (70-30) 100 UNIT/ML injection Inject 35 Units into the skin 2 (two) times daily.    [name] lisinopril (PRINIVIL,ZESTRIL) 40 MG tablet Take 40 mg by mouth daily.    [name] LORazepam (ATIVAN) 0.5 MG tablet Take 0.5 mg by mouth at bedtime as needed for anxiety.    . metoCLOPramide (REGLAN) 10 MG tablet Take 1 tablet (10 mg total) every 6 (six) hours as needed by mouth for nausea or refractory nausea / vomiting (nausea/headache). 30 tablet 1  . metoprolol succinate (TOPROL-XL) 50 MG 24 hr tablet Take 50 mg by mouth daily. Take with or immediately following a meal.    . ondansetron (ZOFRAN) 4 MG tablet Take 1 tablet (4 mg total) every 6 (six) hours by mouth. 20 tablet 0  . pantoprazole (PROTONIX) 40 MG tablet TAKE 1 TABLET BY MOUTH TWICE DAILY BEFORE  A  MEAL 60 tablet 5  . potassium chloride SA (K-DUR,KLOR-CON) 20 MEQ tablet Take 2 tablets (40 mEq total) 2 (two) times daily by mouth. 90 tablet 0  . sertraline (ZOLOFT) 100 MG tablet Take 100 mg by mouth daily.    . sucralfate (CARAFATE) 1 g tablet TAKE ONE TABLET BY MOUTH 4 TIMES DAILY 120 tablet 0  . timolol (TIMOPTIC) 0.5 % ophthalmic solution Place 1 drop into both eyes 2 (two) times daily.     No current facility-administered medications for this visit.     Past Medical History:  Diagnosis Date  . Chronic abdominal pain   . Esophagitis   . Eye hemorrhage   . Gastritis   . Gastroparesis   . GERD (gastroesophageal reflux disease)   . Glaucoma   . Helicobacter pylori (H. pylori) infection 2016   ERADICATION DOCUMENTED VIA STOOL TEST in AUG 2016 at [place]  . Hiccups   . Hypertension   . Nausea and vomiting    chronic, recurrent  . Type 2 diabetes mellitus with complications (HCC)    diagnosed around age 16    Past Surgical History:  Procedure Laterality Date  . CATARACT EXTRACTION W/PHACO Left 05/19/2016   Procedure: CATARACT EXTRACTION PHACO AND INTRAOCULAR LENS PLACEMENT LEFT EYE;   Surgeon: Tonny Branch, MD;  Location: [facility];  Service: Ophthalmology;  Laterality: Left;  CDE: 7.30  . CATARACT EXTRACTION W/PHACO Right 06/23/2016   Procedure: CATARACT EXTRACTION PHACO AND INTRAOCULAR LENS PLACEMENT RIGHT EYE CDE=9.87;  Surgeon: Tonny Branch, MD;  Location: [facility];  Service: Ophthalmology;  Laterality: Right;  right  . ESOPHAGOGASTRODUODENOSCOPY  2015   Dr. Britta Mccreedy  . ESOPHAGOGASTRODUODENOSCOPY N/A 10/26/2014   RMR: Distal esophagititis-likely reflux related although an element of pill induced injuruy no exclueded  status post biopsy. Diffusely abnormal gastric mucosa of uncertain signigicane -status post gastric biopsy. Focal area of excoriation in the cardia most consistant with a trauma of heaving.   [name] EYE SURGERY  2015   stent placed to left eye  . EYE SURGERY    . INCISION AND DRAINAGE OF WOUND Right 07/16/2017   Procedure: IRRIGATION AND DEBRIDEMENT OF SOFT TISSUE OF ULCERATION RIGHT FOOT;  Surgeon: Caprice Beaver, DPM;  Location: [facility];  Service: Podiatry;  Laterality:  Right;    Social History   Socioeconomic History  . Marital status: Single    Spouse name: Not on file  . Number of children: Not on file  . Years of education: Not on file  . Highest education level: Not on file  Social Needs  . Financial resource strain: Not on file  . Food insecurity - worry: Not on file  . Food insecurity - inability: Not on file  . Transportation needs - medical: Not on file  . Transportation needs - non-medical: Not on file  Occupational History  . Not on file  Tobacco Use  . Smoking status: Never Smoker  . Smokeless tobacco: Never Used  Substance and Sexual Activity  . Alcohol use: No    Alcohol/week: 0.0 oz  . Drug use: No  . Sexual activity: Yes  Other Topics Concern  . Not on file  Social History Narrative  . Not on file     No family history of premature CAD in 1st degree relatives.  Current Meds  Medication Sig  . amLODipine (NORVASC) 5 MG tablet  Take 1 tablet (5 mg total) by mouth daily.  [name] atorvastatin (LIPITOR) 10 MG tablet Take 10 mg by mouth daily.  . brimonidine (ALPHAGAN) 0.15 % ophthalmic solution Place 1 drop into both eyes 2 (two) times daily.   . cetirizine (ZYRTEC) 10 MG tablet Take 10 mg by mouth daily.  . cyclobenzaprine (FLEXERIL) 10 MG tablet Take 10 mg by mouth at bedtime as needed for muscle spasms.   [name] dicyclomine (BENTYL) 10 MG capsule Take 1 capsule (10 mg total) by mouth 4 (four) times daily -  before meals and at bedtime.  . gabapentin (NEURONTIN) 600 MG tablet Take 600 mg by mouth 3 (three) times daily with meals as needed.  . hydrochlorothiazide (HYDRODIURIL) 12.5 MG tablet Take 1 tablet (12.5 mg total) by mouth daily.  . insulin detemir (LEVEMIR) 100 UNIT/ML injection Inject 10 Units into the skin at bedtime.   . insulin NPH-regular Human (NOVOLIN 70/30) (70-30) 100 UNIT/ML injection Inject 35 Units into the skin 2 (two) times daily.  [name] lisinopril (PRINIVIL,ZESTRIL) 40 MG tablet Take 40 mg by mouth daily.  [name] LORazepam (ATIVAN) 0.5 MG tablet Take 0.5 mg by mouth at bedtime as needed for anxiety.  . metoCLOPramide (REGLAN) 10 MG tablet Take 1 tablet (10 mg total) every 6 (six) hours as needed by mouth for nausea or refractory nausea / vomiting (nausea/headache).  . metoprolol succinate (TOPROL-XL) 50 MG 24 hr tablet Take 50 mg by mouth daily. Take with or immediately following a meal.  . ondansetron (ZOFRAN) 4 MG tablet Take 1 tablet (4 mg total) every 6 (six) hours by mouth.  . pantoprazole (PROTONIX) 40 MG tablet TAKE 1 TABLET BY MOUTH TWICE DAILY BEFORE  A  MEAL  . potassium chloride SA (K-DUR,KLOR-CON) 20 MEQ tablet Take 2 tablets (40 mEq total) 2 (two) times daily by mouth.  . sertraline (ZOLOFT) 100 MG tablet Take 100 mg by mouth daily.  . sucralfate (CARAFATE) 1 g tablet TAKE ONE TABLET BY MOUTH 4 TIMES DAILY  . timolol (TIMOPTIC) 0.5 % ophthalmic solution Place 1 drop into both eyes 2 (two) times daily.        Review of systems complete and found to be negative unless listed above in HPI    Physical exam Blood pressure (!) 150/84, pulse 80, height 6' (1.829 m), weight 299 lb (135.6 kg), SpO2 98 %. General: NAD Neck: No JVD,  no thyromegaly or thyroid nodule.  Lungs: Clear to auscultation bilaterally with normal respiratory effort. CV: Nondisplaced PMI. Regular rate and rhythm, normal S1/S2, no S3/S4, no murmur.  No peripheral edema.  No carotid bruit.   Right foot in boot. Abdomen: Soft, nontender, no distention.  Skin: Intact without lesions or rashes.  Neurologic: Alert and oriented x 3.  Psych: Normal affect. Extremities: No clubbing or cyanosis.  HEENT: Normal.   ECG: Most recent ECG reviewed.   Labs: Lab Results  Component Value Date/Time   K 3.0 (L) 07/18/2017 08:15 AM   BUN 23 (H) 07/18/2017 08:15 AM   CREATININE 1.79 (H) 07/18/2017 08:15 AM   CREATININE 1.20 06/22/2017 02:36 PM   ALT 10 (L) 07/18/2017 08:15 AM   TSH 1.47 07/20/2009 03:16 PM   HGB 10.3 (L) 07/18/2017 08:15 AM     Lipids: Lab Results  Component Value Date/Time   LDLCALC 72 07/18/2017 08:15 AM   LDLDIRECT 142.1 10/19/2009 04:29 PM   CHOL 132 07/18/2017 08:15 AM   TRIG 178 (H) 07/18/2017 08:15 AM   HDL 24 (L) 07/18/2017 08:15 AM        ASSESSMENT AND PLAN:  1.  Peripheral vascular disease: Currently on Lipitor.  LDL 72.  Hemoglobin 10.3 on 07/18/17.  If he indeed has hemodynamic with significant peripheral vascular disease, I would recommend aspirin 81 mg.  I will make a referral to Dr. Quay Burow, a colleague of mine who specializes in peripheral vascular disease, for further management.  I will allow him to determine if the patient requires angiography.  If he does, he would be at risk for contrast mediated nephropathy given his advanced chronic kidney disease stage III.  2.  Hypertension: Blood pressure is elevated.  He tells me it was normal at 120/70 last week when a visiting nurse checked  it.  This will need continued monitoring to see if further antihypertensive titration is indicated.  Consider increasing amlodipine to 10 mg if it remains elevated.  3.  Hypercholesterolemia: Lipids on 07/18/17 showed total cholesterol 132, triglycerides 178, HDL 24, LDL 72.  Continue Lipitor.  4.  Type 2 insulin-dependent diabetes mellitus: He is currently on insulin.  Managed by PCP.  5.  Grade 2 diastolic dysfunction: He uses compression stockings for leg swelling.  I talked to him about the importance of optimal blood pressure control.   Disposition: Follow up with Dr. Gwenlyn Found. FU with me prn.   Signed: Kate Sable, M.D., F.A.C.C.  08/03/2017, 11:24 AM

## 2017-08-05 ENCOUNTER — Other Ambulatory Visit: Payer: Self-pay | Admitting: Gastroenterology

## 2017-08-05 DIAGNOSIS — E11621 Type 2 diabetes mellitus with foot ulcer: Secondary | ICD-10-CM | POA: Diagnosis not present

## 2017-08-05 DIAGNOSIS — E11319 Type 2 diabetes mellitus with unspecified diabetic retinopathy without macular edema: Secondary | ICD-10-CM | POA: Diagnosis not present

## 2017-08-05 DIAGNOSIS — L97511 Non-pressure chronic ulcer of other part of right foot limited to breakdown of skin: Secondary | ICD-10-CM | POA: Diagnosis not present

## 2017-08-05 DIAGNOSIS — Z794 Long term (current) use of insulin: Secondary | ICD-10-CM | POA: Diagnosis not present

## 2017-08-06 DIAGNOSIS — L97518 Non-pressure chronic ulcer of other part of right foot with other specified severity: Secondary | ICD-10-CM | POA: Diagnosis not present

## 2017-08-06 DIAGNOSIS — E11621 Type 2 diabetes mellitus with foot ulcer: Secondary | ICD-10-CM | POA: Diagnosis not present

## 2017-08-06 DIAGNOSIS — M86671 Other chronic osteomyelitis, right ankle and foot: Secondary | ICD-10-CM | POA: Diagnosis not present

## 2017-08-06 DIAGNOSIS — L97212 Non-pressure chronic ulcer of right calf with fat layer exposed: Secondary | ICD-10-CM | POA: Diagnosis not present

## 2017-08-07 DIAGNOSIS — Z6841 Body Mass Index (BMI) 40.0 and over, adult: Secondary | ICD-10-CM | POA: Diagnosis not present

## 2017-08-07 DIAGNOSIS — E782 Mixed hyperlipidemia: Secondary | ICD-10-CM | POA: Diagnosis not present

## 2017-08-07 DIAGNOSIS — K3184 Gastroparesis: Secondary | ICD-10-CM | POA: Diagnosis not present

## 2017-08-07 DIAGNOSIS — E785 Hyperlipidemia, unspecified: Secondary | ICD-10-CM | POA: Diagnosis not present

## 2017-08-07 DIAGNOSIS — F419 Anxiety disorder, unspecified: Secondary | ICD-10-CM | POA: Diagnosis not present

## 2017-08-07 DIAGNOSIS — E1143 Type 2 diabetes mellitus with diabetic autonomic (poly)neuropathy: Secondary | ICD-10-CM | POA: Diagnosis not present

## 2017-08-07 DIAGNOSIS — M869 Osteomyelitis, unspecified: Secondary | ICD-10-CM | POA: Diagnosis not present

## 2017-08-07 DIAGNOSIS — I10 Essential (primary) hypertension: Secondary | ICD-10-CM | POA: Diagnosis not present

## 2017-08-10 DIAGNOSIS — L97511 Non-pressure chronic ulcer of other part of right foot limited to breakdown of skin: Secondary | ICD-10-CM | POA: Diagnosis not present

## 2017-08-10 DIAGNOSIS — E11621 Type 2 diabetes mellitus with foot ulcer: Secondary | ICD-10-CM | POA: Diagnosis not present

## 2017-08-10 DIAGNOSIS — E11319 Type 2 diabetes mellitus with unspecified diabetic retinopathy without macular edema: Secondary | ICD-10-CM | POA: Diagnosis not present

## 2017-08-10 DIAGNOSIS — Z794 Long term (current) use of insulin: Secondary | ICD-10-CM | POA: Diagnosis not present

## 2017-08-12 ENCOUNTER — Ambulatory Visit: Payer: Medicare HMO | Admitting: Cardiovascular Disease

## 2017-08-12 ENCOUNTER — Encounter: Payer: Self-pay | Admitting: Cardiovascular Disease

## 2017-08-12 VITALS — BP 160/93 | HR 75 | Ht 72.0 in | Wt 301.0 lb

## 2017-08-12 DIAGNOSIS — E11628 Type 2 diabetes mellitus with other skin complications: Secondary | ICD-10-CM

## 2017-08-12 DIAGNOSIS — L089 Local infection of the skin and subcutaneous tissue, unspecified: Secondary | ICD-10-CM

## 2017-08-12 DIAGNOSIS — L97512 Non-pressure chronic ulcer of other part of right foot with fat layer exposed: Secondary | ICD-10-CM | POA: Diagnosis not present

## 2017-08-12 DIAGNOSIS — L97518 Non-pressure chronic ulcer of other part of right foot with other specified severity: Secondary | ICD-10-CM | POA: Diagnosis not present

## 2017-08-12 DIAGNOSIS — Z01810 Encounter for preprocedural cardiovascular examination: Secondary | ICD-10-CM | POA: Diagnosis not present

## 2017-08-12 DIAGNOSIS — E11621 Type 2 diabetes mellitus with foot ulcer: Secondary | ICD-10-CM | POA: Diagnosis not present

## 2017-08-12 NOTE — Assessment & Plan Note (Signed)
Mr. Monier was referred to me by Dr. Jacinta Shoe or peripheral vascular evaluation prior to right great toe amputation scheduled for next week by Dr. Amalia Hailey. He is a long history of diabetes currently insulin-dependent. He does have a history of end organ damage with "triopathy". He's had a right great toe ischemic ulcer/diabetic ulcer that began 8 months ago and ultimately healed. He had an MRI which showed questionable osteomyelitis and this was then reexplored for bone biopsy that apparently subsequently was shown not to be infected however his wound is now slow to heal and he scheduled for amputation next week. I can feel barely palpable pedal pulses. Going to get lower extremity arterial Doppler studies on him this week. He does have moderate renal insufficiency which makes angiography and potential endovascular therapy problematic.

## 2017-08-12 NOTE — Progress Notes (Signed)
08/12/2017 Timothy Clark   Jan 15, 1971  [id number]  Primary Physician Tawni Carnes, PA-C Primary Cardiologist: Lorretta Harp MD FACP, [place], [place], [place]  HPI:  Timothy Clark is a 47 y.o. mildly overweight single African-American male with no children who is currently out of work. He was referred to me by Dr. Jacinta Clark for peripheral vascular evaluation prior to right great toe amputation scheduled next week by Dr. Daylene Clark. 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 He's never had a heart stroke. He denies chest pain or shortness of breath. He developed a diabetic ulcer on the medial aspect of his right great toe at the metatarsal head the months ago which healed slowly with outpatient wound care. 2 months ago he had this reexplored and biopsy for question osteomyelitis found on MRI and since that that time the wound has not healed. He has diabetic peripheral neuropathy with numbness of both feet.   Current Meds  Medication Sig  . atorvastatin (LIPITOR) 10 MG tablet Take 10 mg by mouth daily.  . brimonidine (ALPHAGAN) 0.15 % ophthalmic solution Place 1 drop into both eyes 2 (two) times daily.   . cetirizine (ZYRTEC) 10 MG tablet Take 10 mg by mouth daily.  . cyclobenzaprine (FLEXERIL) 10 MG tablet Take 10 mg by mouth at bedtime as needed for muscle spasms.   [name] dicyclomine (BENTYL) 10 MG capsule TAKE 1 CAPSULE BY MOUTH 4 TIMES DAILY BEFORE MEAL(S) AND AT BEDTIME  . gabapentin (NEURONTIN) 600 MG tablet Take 600 mg by mouth 3 (three) times daily with meals as needed.  . hydrochlorothiazide (HYDRODIURIL) 12.5 MG tablet Take 1 tablet (12.5 mg total) by mouth daily.  . insulin detemir (LEVEMIR) 100 UNIT/ML injection Inject 10 Units into the skin at bedtime.   . insulin NPH-regular Human (NOVOLIN 70/30) (70-30) 100 UNIT/ML injection Inject 35 Units into the skin 2 (two) times daily.  [name] lisinopril (PRINIVIL,ZESTRIL) 40 MG tablet Take 40 mg by mouth  daily.  [name] LORazepam (ATIVAN) 0.5 MG tablet Take 0.5 mg by mouth at bedtime as needed for anxiety.  . metoCLOPramide (REGLAN) 10 MG tablet Take 1 tablet (10 mg total) every 6 (six) hours as needed by mouth for nausea or refractory nausea / vomiting (nausea/headache).  . metoprolol succinate (TOPROL-XL) 50 MG 24 hr tablet Take 50 mg by mouth daily. Take with or immediately following a meal.  . ondansetron (ZOFRAN) 4 MG tablet Take 1 tablet (4 mg total) every 6 (six) hours by mouth.  . pantoprazole (PROTONIX) 40 MG tablet TAKE 1 TABLET BY MOUTH TWICE DAILY BEFORE  A  MEAL  . potassium chloride SA (K-DUR,KLOR-CON) 20 MEQ tablet Take 2 tablets (40 mEq total) 2 (two) times daily by mouth.  . sertraline (ZOLOFT) 100 MG tablet Take 100 mg by mouth daily.  . sucralfate (CARAFATE) 1 g tablet TAKE ONE TABLET BY MOUTH 4 TIMES DAILY  . timolol (TIMOPTIC) 0.5 % ophthalmic solution Place 1 drop into both eyes 2 (two) times daily.     Allergies  Allergen Reactions  . Donnatal [Pb-Hyoscy-Atropine-Scopol Er] Anaphylaxis    Reaction to GI cocktail  . Fish Allergy Anaphylaxis, Hives and Rash  . Haldol [Haloperidol Lactate] Other (See Comments)    Chest Pain  . Lidocaine Anaphylaxis    Reaction to GI cocktail  . Maalox [Calcium Carbonate Antacid] Anaphylaxis    Reaction to GI cocktail  . Aspirin Hives and Itching  . Bactrim [Sulfamethoxazole-Trimethoprim]  Itching  . Keflex [Cephalexin] Hives, Itching and Nausea And Vomiting  . Penicillins Hives and Itching    Has patient had a PCN reaction causing immediate rash, facial/tongue/throat swelling, SOB or lightheadedness with hypotension: yes Has patient had a PCN reaction causing severe rash involving mucus membranes or skin necrosis: yes Has patient had a PCN reaction that required hospitalization no Has patient had a PCN reaction occurring within the last 10 years: yes If all of the above answers are "NO", then may proceed with Cephalospor  . Phenergan  [Promethazine Hcl] Nausea And Vomiting    Social History   Socioeconomic History  . Marital status: Single    Spouse name: Not on file  . Number of children: Not on file  . Years of education: Not on file  . Highest education level: Not on file  Social Needs  . Financial resource strain: Not on file  . Food insecurity - worry: Not on file  . Food insecurity - inability: Not on file  . Transportation needs - medical: Not on file  . Transportation needs - non-medical: Not on file  Occupational History  . Not on file  Tobacco Use  . Smoking status: Never Smoker  . Smokeless tobacco: Never Used  Substance and Sexual Activity  . Alcohol use: No    Alcohol/week: 0.0 oz  . Drug use: No  . Sexual activity: Yes  Other Topics Concern  . Not on file  Social History Narrative  . Not on file     Review of Systems: General: negative for chills, fever, night sweats or weight changes.  Cardiovascular: negative for chest pain, dyspnea on exertion, edema, orthopnea, palpitations, paroxysmal nocturnal dyspnea or shortness of breath Dermatological: negative for rash Respiratory: negative for cough or wheezing Urologic: negative for hematuria Abdominal: negative for nausea, vomiting, diarrhea, bright red blood per rectum, melena, or hematemesis Neurologic: negative for visual changes, syncope, or dizziness All other systems reviewed and are otherwise negative except as noted above.    Blood pressure (!) 160/93, pulse 75, height 6' (1.829 m), weight (!) 301 lb (136.5 kg).  General appearance: alert and no distress Neck: no adenopathy, no carotid bruit, no JVD, supple, symmetrical, trachea midline and thyroid not enlarged, symmetric, no tenderness/mass/nodules Lungs: clear to auscultation bilaterally Heart: regular rate and rhythm, S1, S2 normal, no murmur, click, rub or gallop Extremities: extremities normal, atraumatic, no cyanosis or edema Pulses: Diminished pedal pulses  bilaterally Skin: Skin color, texture, turgor normal. No rashes or lesions Neurologic: Alert and oriented X 3, normal strength and tone. Normal symmetric reflexes. Normal coordination and gait  EKG not performed today  ASSESSMENT AND PLAN:   Diabetic foot infection [facility]) Mr. Coster was referred to me by Dr. Jacinta Clark or peripheral vascular evaluation prior to right great toe amputation scheduled for next week by Dr. Amalia Hailey. He is a long history of diabetes currently insulin-dependent. He does have a history of end organ damage with "triopathy". He's had a right great toe ischemic ulcer/diabetic ulcer that began 8 months ago and ultimately healed. He had an MRI which showed questionable osteomyelitis and this was then reexplored for bone biopsy that apparently subsequently was shown not to be infected however his wound is now slow to heal and he scheduled for amputation next week. I can feel barely palpable pedal pulses. Going to get lower extremity arterial Doppler studies on him this week. He does have moderate renal insufficiency which makes angiography and potential endovascular therapy problematic.  Lorretta Harp MD FACP,FACC,FAHA, [facility] 08/12/2017 3:50 PM

## 2017-08-12 NOTE — Patient Instructions (Addendum)
Medication Instructions: Your physician recommends that you continue on your current medications as directed. Please refer to the Current Medication list given to you today.   Testing/Procedures: Your physician has requested that you have a lower extremity arterial duplex. During this test, ultrasound is used to evaluate arterial blood flow in the legs. Allow one hour for this exam. There are no restrictions or special instructions.  Your physician has requested that you have an ankle brachial index (ABI). During this test an ultrasound and blood pressure cuff are used to evaluate the arteries that supply the arms and legs with blood. Allow thirty minutes for this exam. There are no restrictions or special instructions. [THIS WEEK]   Follow-Up: Your physician recommends that you schedule a follow-up appointment in: 3 months with Dr. Gwenlyn Found.   If you need a refill on your cardiac medications before your next appointment, please call your pharmacy.

## 2017-08-14 ENCOUNTER — Encounter: Payer: Self-pay | Admitting: Gastroenterology

## 2017-08-14 ENCOUNTER — Ambulatory Visit: Payer: Medicare Other | Admitting: Gastroenterology

## 2017-08-14 ENCOUNTER — Ambulatory Visit (HOSPITAL_COMMUNITY)
Admission: RE | Admit: 2017-08-14 | Discharge: 2017-08-14 | Disposition: A | Discharge status: Home / Self Care | Payer: Medicare HMO | Source: Ambulatory Visit | Attending: Cardiovascular Disease | Admitting: Cardiovascular Disease

## 2017-08-14 VITALS — BP 157/88 | HR 75 | Temp 98.1°F | Ht 72.0 in | Wt 297.2 lb

## 2017-08-14 DIAGNOSIS — K3184 Gastroparesis: Secondary | ICD-10-CM | POA: Diagnosis not present

## 2017-08-14 DIAGNOSIS — D649 Anemia, unspecified: Secondary | ICD-10-CM

## 2017-08-14 DIAGNOSIS — I739 Peripheral vascular disease, unspecified: Secondary | ICD-10-CM

## 2017-08-14 DIAGNOSIS — Z01818 Encounter for other preprocedural examination: Secondary | ICD-10-CM | POA: Diagnosis not present

## 2017-08-14 DIAGNOSIS — E1143 Type 2 diabetes mellitus with diabetic autonomic (poly)neuropathy: Secondary | ICD-10-CM | POA: Diagnosis not present

## 2017-08-14 DIAGNOSIS — Z01810 Encounter for preprocedural cardiovascular examination: Secondary | ICD-10-CM | POA: Diagnosis not present

## 2017-08-14 MED ORDER — METOCLOPRAMIDE HCL 10 MG PO TABS: 10.0000 mg | ORAL_TABLET | Freq: Three times a day (TID) | ORAL | 3 refills | Status: DC

## 2017-08-14 NOTE — Progress Notes (Signed)
Referring Provider: Tawni Carnes, PA-C Primary Care Physician:  Tawni Carnes, PA-C Primary GI: Dr. Gala Romney   Chief Complaint  Patient presents with  . gastroparesis    hospital f/u    HPI:   Timothy Clark is a 47 y.o. male presenting today with a history of chronic abdominal pain, esophagitis, gastritis, gastroparesis, history of H.pylori s/p eradication. He needs initial screening colonoscopy at some point. While recently inpatient, had some dysphagia symptoms with speech eval. EGD at [facility], [place] Jan 2017 with mild esophagitis and old gastric contents. While inpatient, noted to have normocytic anemia and thrombocytopenia. Likely chronic disease, with iron studies as below.   Scheduled for right big toe amputation next week with Dr. Amalia Hailey. Reglan BID. Feels a little queasy at times mainly in the morning. PPI BID. Denies dysphagia. Has some epigastric abdominal discomfort intermittently, noticed when he starts feeling "full". Feels like Bentyl is causing constipation. Taking 2 capsules twice a day. Feels like it helps with cramping but wonders if he should decrease this. No dysphagia.   Past Medical History:  Diagnosis Date  . Chronic abdominal pain   . Esophagitis   . Eye hemorrhage   . Gastritis   . Gastroparesis   . GERD (gastroesophageal reflux disease)   . Glaucoma   . Helicobacter pylori (H. pylori) infection 2016   ERADICATION DOCUMENTED VIA STOOL TEST in AUG 2016 at [place]  . Hiccups   . Hypertension   . Nausea and vomiting    chronic, recurrent  . Type 2 diabetes mellitus with complications (HCC)    diagnosed around age 65    Past Surgical History:  Procedure Laterality Date  . CATARACT EXTRACTION W/PHACO Left 05/19/2016   Procedure: CATARACT EXTRACTION PHACO AND INTRAOCULAR LENS PLACEMENT LEFT EYE;  Surgeon: Tonny Branch, MD;  Location: [facility];  Service: Ophthalmology;  Laterality: Left;  CDE: 7.30  . CATARACT EXTRACTION W/PHACO Right 06/23/2016   Procedure:  CATARACT EXTRACTION PHACO AND INTRAOCULAR LENS PLACEMENT RIGHT EYE CDE=9.87;  Surgeon: Tonny Branch, MD;  Location: [facility];  Service: Ophthalmology;  Laterality: Right;  right  . ESOPHAGOGASTRODUODENOSCOPY  2015   Dr. Britta Mccreedy  . ESOPHAGOGASTRODUODENOSCOPY N/A 10/26/2014   RMR: Distal esophagititis-likely reflux related although an element of pill induced injuruy no exclueded  status post biopsy. Diffusely abnormal gastric mucosa of uncertain signigicane -status post gastric biopsy. Focal area of excoriation in the cardia most consistant with a trauma of heaving.   . ESOPHAGOGASTRODUODENOSCOPY  08/2015   [facility]: mild esophagitis and old gastric contents  . EYE SURGERY  2015   stent placed to left eye  . EYE SURGERY    . INCISION AND DRAINAGE OF WOUND Right 07/16/2017   Procedure: IRRIGATION AND DEBRIDEMENT OF SOFT TISSUE OF ULCERATION RIGHT FOOT;  Surgeon: Caprice Beaver, DPM;  Location: [facility];  Service: Podiatry;  Laterality: Right;    Current Outpatient Medications  Medication Sig Dispense Refill  . atorvastatin (LIPITOR) 10 MG tablet Take 10 mg by mouth daily.    . brimonidine (ALPHAGAN) 0.15 % ophthalmic solution Place 1 drop into both eyes 2 (two) times daily.     . cetirizine (ZYRTEC) 10 MG tablet Take 10 mg by mouth daily.    . cyclobenzaprine (FLEXERIL) 10 MG tablet Take 10 mg by mouth at bedtime as needed for muscle spasms.     [name] dicyclomine (BENTYL) 10 MG capsule TAKE 1 CAPSULE BY MOUTH 4 TIMES DAILY BEFORE MEAL(S) AND AT BEDTIME 120 capsule 5  .  gabapentin (NEURONTIN) 600 MG tablet Take 600 mg by mouth 3 (three) times daily with meals as needed.    . hydrochlorothiazide (HYDRODIURIL) 12.5 MG tablet Take 1 tablet (12.5 mg total) by mouth daily. 30 tablet 0  . insulin detemir (LEVEMIR) 100 UNIT/ML injection Inject 10 Units into the skin at bedtime.     . insulin NPH-regular Human (NOVOLIN 70/30) (70-30) 100 UNIT/ML injection Inject 35 Units into the skin 2 (two) times daily.    [name]  lisinopril (PRINIVIL,ZESTRIL) 40 MG tablet Take 40 mg by mouth daily.    [name] LORazepam (ATIVAN) 0.5 MG tablet Take 0.5 mg by mouth at bedtime as needed for anxiety.    . metoprolol succinate (TOPROL-XL) 50 MG 24 hr tablet Take 50 mg by mouth daily. Take with or immediately following a meal.    . ondansetron (ZOFRAN) 4 MG tablet Take 1 tablet (4 mg total) every 6 (six) hours by mouth. 20 tablet 0  . pantoprazole (PROTONIX) 40 MG tablet TAKE 1 TABLET BY MOUTH TWICE DAILY BEFORE  A  MEAL 60 tablet 5  . potassium chloride SA (K-DUR,KLOR-CON) 20 MEQ tablet Take 2 tablets (40 mEq total) 2 (two) times daily by mouth. 90 tablet 0  . sucralfate (CARAFATE) 1 g tablet TAKE ONE TABLET BY MOUTH 4 TIMES DAILY 120 tablet 0  . timolol (TIMOPTIC) 0.5 % ophthalmic solution Place 1 drop into both eyes 2 (two) times daily.    . metoCLOPramide (REGLAN) 10 MG tablet Take 1 tablet (10 mg total) by mouth 3 (three) times daily before meals. 90 tablet 3   No current facility-administered medications for this visit.     Allergies as of 08/14/2017 - Review Complete 08/14/2017  Allergen Reaction Noted  . Donnatal [pb-hyoscy-atropine-scopol er] Anaphylaxis 07/12/2015  . Fish allergy Anaphylaxis, Hives, and Rash 01/14/2015  . Haldol [haloperidol lactate] Other (See Comments) 08/07/2015  . Lidocaine Anaphylaxis 07/12/2015  . Maalox [calcium carbonate antacid] Anaphylaxis 07/12/2015  . Aspirin Hives and Itching 03/22/2009  . Bactrim [sulfamethoxazole-trimethoprim] Itching 12/06/2014  . Keflex [cephalexin] Hives, Itching, and Nausea And Vomiting 06/22/2017  . Penicillins Hives and Itching 03/22/2009  . Phenergan [promethazine hcl] Nausea And Vomiting 10/27/2014    Family History  Problem Relation Age of Onset  . Ovarian cancer Mother   . Heart attack Father   . Cervical cancer Sister   . Diabetes Sister   . Colon cancer Neg Hx     Social History   Socioeconomic History  . Marital status: Single    Spouse name:  None  . Number of children: None  . Years of education: None  . Highest education level: None  Social Needs  . Financial resource strain: None  . Food insecurity - worry: None  . Food insecurity - inability: None  . Transportation needs - medical: None  . Transportation needs - non-medical: None  Occupational History  . None  Tobacco Use  . Smoking status: Never Smoker  . Smokeless tobacco: Never Used  Substance and Sexual Activity  . Alcohol use: No    Alcohol/week: 0.0 oz  . Drug use: No  . Sexual activity: Yes  Other Topics Concern  . None  Social History Narrative  . None    Review of Systems: Gen: Denies fever, chills, anorexia. Denies fatigue, weakness, weight loss.  CV: Denies chest pain, palpitations, syncope, peripheral edema, and claudication. Resp: Denies dyspnea at rest, cough, wheezing, coughing up blood, and pleurisy. GI: see HPI  Derm: Denies rash, itching,  dry skin Psych: Denies depression, anxiety, memory loss, confusion. No homicidal or suicidal ideation.  Heme: Denies bruising, bleeding, and enlarged lymph nodes.  Physical Exam: BP (!) 157/88   Pulse 75   Temp 98.1 F (36.7 C) (Oral)   Ht 6' (1.829 m)   Wt 297 lb 3.2 oz (134.8 kg)   BMI 40.31 kg/m  General:   Alert and oriented. No distress noted. Pleasant and cooperative.  Head:  Normocephalic and atraumatic. Eyes:  Conjuctiva clear without scleral icterus. Mouth:  Oral mucosa pink and moist.  Abdomen:  +BS, soft, mild TTP LUQ and non-distended. No rebound or guarding. No HSM or masses noted. Msk:  Symmetrical without gross deformities. Normal posture. Extremities:  Without edema. Neurologic:  Alert and  oriented x4 Psych:  Alert and cooperative. Normal mood and affect.   Lab Results  Component Value Date   IRON 44 (L) 06/15/2017   TIBC 203 (L) 06/15/2017   FERRITIN 344 (H) 06/15/2017   Lab Results  Component Value Date   WBC 8.8 07/18/2017   HGB 10.3 (L) 07/18/2017   HCT 30.3 (L)  07/18/2017   MCV 91.3 07/18/2017   PLT 136 (L) 07/18/2017

## 2017-08-14 NOTE — Progress Notes (Signed)
CC'D TO PCP °

## 2017-08-14 NOTE — Assessment & Plan Note (Signed)
47 year old male with history of gastroparesis, now at baseline with PPI BID, Reglan BID. Some abdominal discomfort noted when he is more bloated. Feels nauseated occasionally. We discussed increasing Reglan to TID before meals only as needed, otherwise can keep at BID. Discussed gastroparesis diet. Return in 3 months.

## 2017-08-14 NOTE — Assessment & Plan Note (Signed)
Normocytic anemia, likely of chronic disease. No overt GI bleeding. Needs first ever screening colonoscopy at some point in future. EGD fairly recent as of last year at [facility]. Will be undergoing right toe amputation next week, so we will hold off on elective colonoscopy for now. Will arrange when he returns in 3 months.

## 2017-08-14 NOTE — Patient Instructions (Signed)
Let's decrease Bentyl to 1 capsule twice a day.   I have sent in Reglan to continue taking before breakfast and dinner, and you may take before lunch as well if needed.   We will see you in 3 months to arrange a colonoscopy.

## 2017-08-17 ENCOUNTER — Ambulatory Visit: Payer: Medicare Other | Admitting: Podiatry

## 2017-08-17 DIAGNOSIS — E113512 Type 2 diabetes mellitus with proliferative diabetic retinopathy with macular edema, left eye: Secondary | ICD-10-CM | POA: Diagnosis not present

## 2017-08-17 DIAGNOSIS — E113591 Type 2 diabetes mellitus with proliferative diabetic retinopathy without macular edema, right eye: Secondary | ICD-10-CM | POA: Diagnosis not present

## 2017-08-17 DIAGNOSIS — H4720 Unspecified optic atrophy: Secondary | ICD-10-CM | POA: Diagnosis not present

## 2017-08-17 DIAGNOSIS — H4051X3 Glaucoma secondary to other eye disorders, right eye, severe stage: Secondary | ICD-10-CM | POA: Diagnosis not present

## 2017-08-20 ENCOUNTER — Encounter: Payer: Self-pay | Admitting: Podiatry

## 2017-08-20 DIAGNOSIS — L97512 Non-pressure chronic ulcer of other part of right foot with fat layer exposed: Secondary | ICD-10-CM | POA: Diagnosis not present

## 2017-08-20 DIAGNOSIS — I96 Gangrene, not elsewhere classified: Secondary | ICD-10-CM | POA: Diagnosis not present

## 2017-08-20 DIAGNOSIS — I10 Essential (primary) hypertension: Secondary | ICD-10-CM | POA: Diagnosis not present

## 2017-08-20 DIAGNOSIS — M86671 Other chronic osteomyelitis, right ankle and foot: Secondary | ICD-10-CM | POA: Diagnosis not present

## 2017-08-20 DIAGNOSIS — M86471 Chronic osteomyelitis with draining sinus, right ankle and foot: Secondary | ICD-10-CM | POA: Diagnosis not present

## 2017-08-20 DIAGNOSIS — M65071 Abscess of tendon sheath, right ankle and foot: Secondary | ICD-10-CM | POA: Diagnosis not present

## 2017-08-20 DIAGNOSIS — M898X7 Other specified disorders of bone, ankle and foot: Secondary | ICD-10-CM | POA: Diagnosis not present

## 2017-08-26 ENCOUNTER — Encounter: Payer: Self-pay | Admitting: Podiatry

## 2017-08-26 ENCOUNTER — Other Ambulatory Visit: Payer: Self-pay | Admitting: Podiatry

## 2017-08-26 ENCOUNTER — Ambulatory Visit (INDEPENDENT_AMBULATORY_CARE_PROVIDER_SITE_OTHER): Payer: Medicare Other | Admitting: Podiatry

## 2017-08-26 ENCOUNTER — Ambulatory Visit (INDEPENDENT_AMBULATORY_CARE_PROVIDER_SITE_OTHER): Payer: Medicare HMO

## 2017-08-26 DIAGNOSIS — Z89421 Acquired absence of other right toe(s): Secondary | ICD-10-CM

## 2017-08-26 DIAGNOSIS — Z9889 Other specified postprocedural states: Secondary | ICD-10-CM

## 2017-08-26 MED ORDER — CIPROFLOXACIN HCL 500 MG PO TABS: 500.0000 mg | ORAL_TABLET | Freq: Two times a day (BID) | ORAL | 0 refills | Status: DC

## 2017-08-30 DIAGNOSIS — E860 Dehydration: Secondary | ICD-10-CM | POA: Diagnosis not present

## 2017-08-30 DIAGNOSIS — R079 Chest pain, unspecified: Secondary | ICD-10-CM | POA: Diagnosis not present

## 2017-08-30 DIAGNOSIS — R410 Disorientation, unspecified: Secondary | ICD-10-CM | POA: Diagnosis not present

## 2017-08-30 DIAGNOSIS — R0789 Other chest pain: Secondary | ICD-10-CM | POA: Diagnosis not present

## 2017-08-30 DIAGNOSIS — K3184 Gastroparesis: Secondary | ICD-10-CM | POA: Diagnosis not present

## 2017-08-30 DIAGNOSIS — Z833 Family history of diabetes mellitus: Secondary | ICD-10-CM | POA: Diagnosis not present

## 2017-08-30 DIAGNOSIS — R51 Headache: Secondary | ICD-10-CM | POA: Diagnosis not present

## 2017-08-30 DIAGNOSIS — E1143 Type 2 diabetes mellitus with diabetic autonomic (poly)neuropathy: Secondary | ICD-10-CM | POA: Diagnosis not present

## 2017-08-30 DIAGNOSIS — Z794 Long term (current) use of insulin: Secondary | ICD-10-CM | POA: Diagnosis not present

## 2017-08-30 DIAGNOSIS — R197 Diarrhea, unspecified: Secondary | ICD-10-CM | POA: Diagnosis not present

## 2017-08-30 DIAGNOSIS — Z79899 Other long term (current) drug therapy: Secondary | ICD-10-CM | POA: Diagnosis not present

## 2017-08-30 NOTE — Progress Notes (Signed)
   Subjective:  Patient presents today status post partial first ray amputation right. DOS: 08/20/17.  He states the wound is still very "wet".  He reports that the packing has started to come out with dressing changes.  He states he is doing well overall.  Patient is here for further evaluation and treatment.   Past Medical History:  Diagnosis Date  . Chronic abdominal pain   . Esophagitis   . Eye hemorrhage   . Gastritis   . Gastroparesis   . GERD (gastroesophageal reflux disease)   . Glaucoma   . Helicobacter pylori (H. pylori) infection 2016   ERADICATION DOCUMENTED VIA STOOL TEST in AUG 2016 at [place]  . Hiccups   . Hypertension   . Nausea and vomiting    chronic, recurrent  . Type 2 diabetes mellitus with complications ([place])    diagnosed around age 74      Objective/Physical Exam Neurovascular status intact.  Skin incisions appear to be well coapted with sutures and staples intact. No sign of infectious process noted. No dehiscence. No active bleeding noted. Moderate edema noted to the surgical extremity.  Radiographic Exam:  Osteotomies sites appear to be stable with routine healing.  Assessment: 1. s/p partial first ray amputation right. DOS: 08/20/17   Plan of Care:  1.  Patient was evaluated. X-rays reviewed 2.  Packing removed.  3-0 nylon suture placed to close the area where packing was removed. 3.  Dry sterile dressing applied.  Keep clean, dry and intact for 1 week. 4.  Continue weightbearing in cam boot. 5.  Refill prescription for Cipro 500 mg #20 provided to patient. 6.  Return to clinic in 1 week.   Edrick Kins, DPM [facility]  Dr. Edrick Kins, [address]                                        [place], [place] [address]                Office [phone]  Fax [phone]

## 2017-08-31 DIAGNOSIS — F411 Generalized anxiety disorder: Secondary | ICD-10-CM | POA: Diagnosis not present

## 2017-08-31 DIAGNOSIS — Z833 Family history of diabetes mellitus: Secondary | ICD-10-CM | POA: Diagnosis not present

## 2017-08-31 DIAGNOSIS — Z794 Long term (current) use of insulin: Secondary | ICD-10-CM | POA: Diagnosis not present

## 2017-08-31 DIAGNOSIS — E1143 Type 2 diabetes mellitus with diabetic autonomic (poly)neuropathy: Secondary | ICD-10-CM | POA: Diagnosis not present

## 2017-08-31 DIAGNOSIS — Z79899 Other long term (current) drug therapy: Secondary | ICD-10-CM | POA: Diagnosis not present

## 2017-08-31 DIAGNOSIS — K3184 Gastroparesis: Secondary | ICD-10-CM | POA: Diagnosis not present

## 2017-08-31 DIAGNOSIS — R0602 Shortness of breath: Secondary | ICD-10-CM | POA: Diagnosis not present

## 2017-09-02 ENCOUNTER — Encounter: Payer: Self-pay | Admitting: Podiatry

## 2017-09-02 ENCOUNTER — Ambulatory Visit (INDEPENDENT_AMBULATORY_CARE_PROVIDER_SITE_OTHER): Payer: Medicare HMO | Admitting: Podiatry

## 2017-09-02 VITALS — BP 165/96 | HR 77 | Temp 98.9°F

## 2017-09-02 DIAGNOSIS — Z89421 Acquired absence of other right toe(s): Secondary | ICD-10-CM

## 2017-09-02 MED ORDER — GENTAMICIN SULFATE 0.1 % EX CREA: 1.0000 "application " | TOPICAL_CREAM | Freq: Three times a day (TID) | CUTANEOUS | 1 refills | Status: DC

## 2017-09-05 NOTE — Progress Notes (Signed)
   Subjective:  Patient presents today status post partial first ray amputation right. DOS: 08/20/17. He reports drainage from the incision site. He reports associated malodor. Patient is here for further evaluation and treatment.   Past Medical History:  Diagnosis Date  . Chronic abdominal pain   . Esophagitis   . Eye hemorrhage   . Gastritis   . Gastroparesis   . GERD (gastroesophageal reflux disease)   . Glaucoma   . Helicobacter pylori (H. pylori) infection 2016   ERADICATION DOCUMENTED VIA STOOL TEST in AUG 2016 at [place]  . Hiccups   . Hypertension   . Nausea and vomiting    chronic, recurrent  . Type 2 diabetes mellitus with complications ([place])    diagnosed around age 56      Objective/Physical Exam Neurovascular status intact.  Skin incisions appear to be well coapted with sutures and staples intact. No sign of infectious process noted. No dehiscence. No active bleeding noted. Moderate edema noted to the surgical extremity.  Assessment: 1. s/p partial first ray amputation right. DOS: 08/20/17   Plan of Care:  1. Patient was evaluated.  2. Dressing changed. 3. Continue weightbearing in CAM boot. 4. Dressing supplies provided.  5. Prescription for Gentamicin cream provided.  6. Return to clinic in 2 weeks.   Edrick Kins, DPM [facility]  Dr. Edrick Kins, [address]                                        [place], [place] [address]                Office [phone]  Fax [phone]

## 2017-09-07 DIAGNOSIS — Z0100 Encounter for examination of eyes and vision without abnormal findings: Secondary | ICD-10-CM | POA: Diagnosis not present

## 2017-09-07 DIAGNOSIS — Z794 Long term (current) use of insulin: Secondary | ICD-10-CM | POA: Diagnosis not present

## 2017-09-07 DIAGNOSIS — E113293 Type 2 diabetes mellitus with mild nonproliferative diabetic retinopathy without macular edema, bilateral: Secondary | ICD-10-CM | POA: Diagnosis not present

## 2017-09-07 DIAGNOSIS — E11311 Type 2 diabetes mellitus with unspecified diabetic retinopathy with macular edema: Secondary | ICD-10-CM | POA: Diagnosis not present

## 2017-09-07 DIAGNOSIS — E1165 Type 2 diabetes mellitus with hyperglycemia: Secondary | ICD-10-CM | POA: Diagnosis not present

## 2017-09-13 DIAGNOSIS — Z794 Long term (current) use of insulin: Secondary | ICD-10-CM | POA: Diagnosis not present

## 2017-09-13 DIAGNOSIS — I10 Essential (primary) hypertension: Secondary | ICD-10-CM | POA: Diagnosis not present

## 2017-09-13 DIAGNOSIS — H409 Unspecified glaucoma: Secondary | ICD-10-CM | POA: Diagnosis not present

## 2017-09-13 DIAGNOSIS — R110 Nausea: Secondary | ICD-10-CM | POA: Diagnosis not present

## 2017-09-13 DIAGNOSIS — Z79899 Other long term (current) drug therapy: Secondary | ICD-10-CM | POA: Diagnosis not present

## 2017-09-13 DIAGNOSIS — K3184 Gastroparesis: Secondary | ICD-10-CM | POA: Diagnosis not present

## 2017-09-13 DIAGNOSIS — E1143 Type 2 diabetes mellitus with diabetic autonomic (poly)neuropathy: Secondary | ICD-10-CM | POA: Diagnosis not present

## 2017-09-15 DIAGNOSIS — K219 Gastro-esophageal reflux disease without esophagitis: Secondary | ICD-10-CM | POA: Diagnosis not present

## 2017-09-15 DIAGNOSIS — Z888 Allergy status to other drugs, medicaments and biological substances status: Secondary | ICD-10-CM | POA: Diagnosis not present

## 2017-09-15 DIAGNOSIS — Z880 Allergy status to penicillin: Secondary | ICD-10-CM | POA: Diagnosis not present

## 2017-09-15 DIAGNOSIS — R1110 Vomiting, unspecified: Secondary | ICD-10-CM | POA: Diagnosis not present

## 2017-09-15 DIAGNOSIS — Z79899 Other long term (current) drug therapy: Secondary | ICD-10-CM | POA: Diagnosis not present

## 2017-09-15 DIAGNOSIS — Z833 Family history of diabetes mellitus: Secondary | ICD-10-CM | POA: Diagnosis not present

## 2017-09-15 DIAGNOSIS — R410 Disorientation, unspecified: Secondary | ICD-10-CM | POA: Diagnosis not present

## 2017-09-15 DIAGNOSIS — K3184 Gastroparesis: Secondary | ICD-10-CM | POA: Diagnosis not present

## 2017-09-15 DIAGNOSIS — R109 Unspecified abdominal pain: Secondary | ICD-10-CM | POA: Diagnosis not present

## 2017-09-15 DIAGNOSIS — Z794 Long term (current) use of insulin: Secondary | ICD-10-CM | POA: Diagnosis not present

## 2017-09-15 DIAGNOSIS — I10 Essential (primary) hypertension: Secondary | ICD-10-CM | POA: Diagnosis not present

## 2017-09-15 DIAGNOSIS — E119 Type 2 diabetes mellitus without complications: Secondary | ICD-10-CM | POA: Diagnosis not present

## 2017-09-15 DIAGNOSIS — F418 Other specified anxiety disorders: Secondary | ICD-10-CM | POA: Diagnosis not present

## 2017-09-15 DIAGNOSIS — F319 Bipolar disorder, unspecified: Secondary | ICD-10-CM | POA: Diagnosis not present

## 2017-09-15 DIAGNOSIS — E7800 Pure hypercholesterolemia, unspecified: Secondary | ICD-10-CM | POA: Diagnosis not present

## 2017-09-15 DIAGNOSIS — E1143 Type 2 diabetes mellitus with diabetic autonomic (poly)neuropathy: Secondary | ICD-10-CM | POA: Diagnosis not present

## 2017-09-16 DIAGNOSIS — F4323 Adjustment disorder with mixed anxiety and depressed mood: Secondary | ICD-10-CM | POA: Diagnosis not present

## 2017-09-16 DIAGNOSIS — F418 Other specified anxiety disorders: Secondary | ICD-10-CM | POA: Diagnosis not present

## 2017-09-16 DIAGNOSIS — Z880 Allergy status to penicillin: Secondary | ICD-10-CM | POA: Diagnosis not present

## 2017-09-16 DIAGNOSIS — E7800 Pure hypercholesterolemia, unspecified: Secondary | ICD-10-CM | POA: Diagnosis not present

## 2017-09-16 DIAGNOSIS — Z794 Long term (current) use of insulin: Secondary | ICD-10-CM | POA: Diagnosis not present

## 2017-09-16 DIAGNOSIS — K219 Gastro-esophageal reflux disease without esophagitis: Secondary | ICD-10-CM | POA: Diagnosis not present

## 2017-09-16 DIAGNOSIS — K3184 Gastroparesis: Secondary | ICD-10-CM | POA: Diagnosis not present

## 2017-09-16 DIAGNOSIS — I10 Essential (primary) hypertension: Secondary | ICD-10-CM | POA: Diagnosis not present

## 2017-09-16 DIAGNOSIS — E1139 Type 2 diabetes mellitus with other diabetic ophthalmic complication: Secondary | ICD-10-CM | POA: Diagnosis not present

## 2017-09-16 DIAGNOSIS — H42 Glaucoma in diseases classified elsewhere: Secondary | ICD-10-CM | POA: Diagnosis not present

## 2017-09-16 DIAGNOSIS — Z888 Allergy status to other drugs, medicaments and biological substances status: Secondary | ICD-10-CM | POA: Diagnosis not present

## 2017-09-16 DIAGNOSIS — F329 Major depressive disorder, single episode, unspecified: Secondary | ICD-10-CM | POA: Diagnosis not present

## 2017-09-16 DIAGNOSIS — H547 Unspecified visual loss: Secondary | ICD-10-CM | POA: Diagnosis not present

## 2017-09-16 DIAGNOSIS — H409 Unspecified glaucoma: Secondary | ICD-10-CM | POA: Diagnosis not present

## 2017-09-16 DIAGNOSIS — E119 Type 2 diabetes mellitus without complications: Secondary | ICD-10-CM | POA: Diagnosis not present

## 2017-09-16 DIAGNOSIS — X838XXA Intentional self-harm by other specified means, initial encounter: Secondary | ICD-10-CM | POA: Diagnosis not present

## 2017-09-16 DIAGNOSIS — E1143 Type 2 diabetes mellitus with diabetic autonomic (poly)neuropathy: Secondary | ICD-10-CM | POA: Diagnosis not present

## 2017-09-19 DIAGNOSIS — R112 Nausea with vomiting, unspecified: Secondary | ICD-10-CM | POA: Diagnosis not present

## 2017-09-19 DIAGNOSIS — R109 Unspecified abdominal pain: Secondary | ICD-10-CM | POA: Diagnosis not present

## 2017-09-19 DIAGNOSIS — E860 Dehydration: Secondary | ICD-10-CM | POA: Diagnosis not present

## 2017-09-19 DIAGNOSIS — E876 Hypokalemia: Secondary | ICD-10-CM | POA: Diagnosis not present

## 2017-09-19 DIAGNOSIS — E785 Hyperlipidemia, unspecified: Secondary | ICD-10-CM | POA: Diagnosis not present

## 2017-09-19 DIAGNOSIS — K2970 Gastritis, unspecified, without bleeding: Secondary | ICD-10-CM | POA: Diagnosis not present

## 2017-09-19 DIAGNOSIS — Z7902 Long term (current) use of antithrombotics/antiplatelets: Secondary | ICD-10-CM | POA: Diagnosis not present

## 2017-09-19 DIAGNOSIS — H5461 Unqualified visual loss, right eye, normal vision left eye: Secondary | ICD-10-CM | POA: Diagnosis not present

## 2017-09-19 DIAGNOSIS — K3184 Gastroparesis: Secondary | ICD-10-CM | POA: Diagnosis not present

## 2017-09-19 DIAGNOSIS — E119 Type 2 diabetes mellitus without complications: Secondary | ICD-10-CM | POA: Diagnosis not present

## 2017-09-19 DIAGNOSIS — I10 Essential (primary) hypertension: Secondary | ICD-10-CM | POA: Diagnosis not present

## 2017-09-19 DIAGNOSIS — R1013 Epigastric pain: Secondary | ICD-10-CM | POA: Diagnosis not present

## 2017-09-20 ENCOUNTER — Inpatient Hospital Stay (HOSPITAL_COMMUNITY)
Admission: EM | Admit: 2017-09-20 | Discharge: 2017-09-26 | DRG: 682 | Disposition: A | Discharge status: Home / Self Care | Payer: Medicare HMO | Attending: Internal Medicine | Admitting: Internal Medicine

## 2017-09-20 ENCOUNTER — Encounter (HOSPITAL_COMMUNITY): Payer: Self-pay | Admitting: *Deleted

## 2017-09-20 ENCOUNTER — Ambulatory Visit (HOSPITAL_COMMUNITY)
Admission: RE | Admit: 2017-09-20 | Discharge: 2017-09-20 | Disposition: A | Discharge status: Home / Self Care | Payer: Medicare HMO | Source: Home / Self Care | Attending: Psychiatry | Admitting: Psychiatry

## 2017-09-20 DIAGNOSIS — I10 Essential (primary) hypertension: Secondary | ICD-10-CM | POA: Diagnosis not present

## 2017-09-20 DIAGNOSIS — E118 Type 2 diabetes mellitus with unspecified complications: Secondary | ICD-10-CM

## 2017-09-20 DIAGNOSIS — R109 Unspecified abdominal pain: Secondary | ICD-10-CM

## 2017-09-20 DIAGNOSIS — F32A Depression, unspecified: Secondary | ICD-10-CM | POA: Diagnosis present

## 2017-09-20 DIAGNOSIS — R45851 Suicidal ideations: Secondary | ICD-10-CM

## 2017-09-20 DIAGNOSIS — Z880 Allergy status to penicillin: Secondary | ICD-10-CM

## 2017-09-20 DIAGNOSIS — E860 Dehydration: Secondary | ICD-10-CM | POA: Diagnosis present

## 2017-09-20 DIAGNOSIS — N179 Acute kidney failure, unspecified: Secondary | ICD-10-CM | POA: Diagnosis not present

## 2017-09-20 DIAGNOSIS — Z961 Presence of intraocular lens: Secondary | ICD-10-CM | POA: Diagnosis present

## 2017-09-20 DIAGNOSIS — K219 Gastro-esophageal reflux disease without esophagitis: Secondary | ICD-10-CM | POA: Diagnosis not present

## 2017-09-20 DIAGNOSIS — Z833 Family history of diabetes mellitus: Secondary | ICD-10-CM

## 2017-09-20 DIAGNOSIS — E1122 Type 2 diabetes mellitus with diabetic chronic kidney disease: Secondary | ICD-10-CM | POA: Diagnosis present

## 2017-09-20 DIAGNOSIS — N183 Chronic kidney disease, stage 3 (moderate): Secondary | ICD-10-CM | POA: Diagnosis not present

## 2017-09-20 DIAGNOSIS — R4020 Unspecified coma: Secondary | ICD-10-CM | POA: Diagnosis not present

## 2017-09-20 DIAGNOSIS — E785 Hyperlipidemia, unspecified: Secondary | ICD-10-CM | POA: Diagnosis present

## 2017-09-20 DIAGNOSIS — E119 Type 2 diabetes mellitus without complications: Secondary | ICD-10-CM

## 2017-09-20 DIAGNOSIS — Z8041 Family history of malignant neoplasm of ovary: Secondary | ICD-10-CM | POA: Diagnosis not present

## 2017-09-20 DIAGNOSIS — G92 Toxic encephalopathy: Secondary | ICD-10-CM | POA: Diagnosis present

## 2017-09-20 DIAGNOSIS — R9431 Abnormal electrocardiogram [ECG] [EKG]: Secondary | ICD-10-CM | POA: Diagnosis not present

## 2017-09-20 DIAGNOSIS — Z9842 Cataract extraction status, left eye: Secondary | ICD-10-CM

## 2017-09-20 DIAGNOSIS — Z8249 Family history of ischemic heart disease and other diseases of the circulatory system: Secondary | ICD-10-CM

## 2017-09-20 DIAGNOSIS — I160 Hypertensive urgency: Secondary | ICD-10-CM | POA: Diagnosis present

## 2017-09-20 DIAGNOSIS — Z91013 Allergy to seafood: Secondary | ICD-10-CM

## 2017-09-20 DIAGNOSIS — R450 Nervousness: Secondary | ICD-10-CM

## 2017-09-20 DIAGNOSIS — H409 Unspecified glaucoma: Secondary | ICD-10-CM | POA: Diagnosis present

## 2017-09-20 DIAGNOSIS — E1165 Type 2 diabetes mellitus with hyperglycemia: Secondary | ICD-10-CM | POA: Diagnosis not present

## 2017-09-20 DIAGNOSIS — F419 Anxiety disorder, unspecified: Secondary | ICD-10-CM | POA: Diagnosis present

## 2017-09-20 DIAGNOSIS — Z6841 Body Mass Index (BMI) 40.0 and over, adult: Secondary | ICD-10-CM

## 2017-09-20 DIAGNOSIS — E669 Obesity, unspecified: Secondary | ICD-10-CM | POA: Diagnosis present

## 2017-09-20 DIAGNOSIS — Z781 Physical restraint status: Secondary | ICD-10-CM | POA: Diagnosis not present

## 2017-09-20 DIAGNOSIS — Z89421 Acquired absence of other right toe(s): Secondary | ICD-10-CM

## 2017-09-20 DIAGNOSIS — Z888 Allergy status to other drugs, medicaments and biological substances status: Secondary | ICD-10-CM

## 2017-09-20 DIAGNOSIS — N186 End stage renal disease: Secondary | ICD-10-CM | POA: Diagnosis present

## 2017-09-20 DIAGNOSIS — E876 Hypokalemia: Secondary | ICD-10-CM | POA: Diagnosis not present

## 2017-09-20 DIAGNOSIS — R454 Irritability and anger: Secondary | ICD-10-CM

## 2017-09-20 DIAGNOSIS — F329 Major depressive disorder, single episode, unspecified: Secondary | ICD-10-CM | POA: Diagnosis present

## 2017-09-20 DIAGNOSIS — G4700 Insomnia, unspecified: Secondary | ICD-10-CM | POA: Insufficient documentation

## 2017-09-20 DIAGNOSIS — K210 Gastro-esophageal reflux disease with esophagitis: Secondary | ICD-10-CM | POA: Diagnosis present

## 2017-09-20 DIAGNOSIS — Z886 Allergy status to analgesic agent status: Secondary | ICD-10-CM

## 2017-09-20 DIAGNOSIS — I129 Hypertensive chronic kidney disease with stage 1 through stage 4 chronic kidney disease, or unspecified chronic kidney disease: Secondary | ICD-10-CM | POA: Diagnosis not present

## 2017-09-20 DIAGNOSIS — G8929 Other chronic pain: Secondary | ICD-10-CM | POA: Diagnosis not present

## 2017-09-20 DIAGNOSIS — E1143 Type 2 diabetes mellitus with diabetic autonomic (poly)neuropathy: Secondary | ICD-10-CM | POA: Diagnosis present

## 2017-09-20 DIAGNOSIS — Z881 Allergy status to other antibiotic agents status: Secondary | ICD-10-CM

## 2017-09-20 DIAGNOSIS — Z794 Long term (current) use of insulin: Secondary | ICD-10-CM | POA: Diagnosis not present

## 2017-09-20 DIAGNOSIS — K3184 Gastroparesis: Secondary | ICD-10-CM | POA: Diagnosis present

## 2017-09-20 DIAGNOSIS — Z9841 Cataract extraction status, right eye: Secondary | ICD-10-CM | POA: Diagnosis not present

## 2017-09-20 DIAGNOSIS — Z8049 Family history of malignant neoplasm of other genital organs: Secondary | ICD-10-CM

## 2017-09-20 DIAGNOSIS — Z884 Allergy status to anesthetic agent status: Secondary | ICD-10-CM

## 2017-09-20 DIAGNOSIS — IMO0001 Reserved for inherently not codable concepts without codable children: Secondary | ICD-10-CM

## 2017-09-20 LAB — COMPREHENSIVE METABOLIC PANEL
ALT: 14 U/L — ABNORMAL LOW (ref 17–63)
AST: 23 U/L (ref 15–41)
Albumin: 3.3 g/dL — ABNORMAL LOW (ref 3.5–5.0)
Alkaline Phosphatase: 54 U/L (ref 38–126)
Anion gap: 9 (ref 5–15)
BUN: 17 mg/dL (ref 6–20)
CO2: 30 mmol/L (ref 22–32)
Calcium: 8.3 mg/dL — ABNORMAL LOW (ref 8.9–10.3)
Chloride: 94 mmol/L — ABNORMAL LOW (ref 101–111)
Creatinine, Ser: 2.47 mg/dL — ABNORMAL HIGH (ref 0.61–1.24)
GFR calc Af Amer: 34 mL/min — ABNORMAL LOW (ref >60)
GFR calc non Af Amer: 29 mL/min — ABNORMAL LOW (ref >60)
Glucose, Bld: 198 mg/dL — ABNORMAL HIGH (ref 65–99)
Potassium: 2.4 mmol/L — CL (ref 3.5–5.1)
Sodium: 133 mmol/L — ABNORMAL LOW (ref 135–145)
Total Bilirubin: 0.7 mg/dL (ref 0.3–1.2)
Total Protein: 6.6 g/dL (ref 6.5–8.1)

## 2017-09-20 LAB — CBC
HCT: 27.4 % — ABNORMAL LOW (ref 39.0–52.0)
Hemoglobin: 9.8 g/dL — ABNORMAL LOW (ref 13.0–17.0)
MCH: 30.9 pg (ref 26.0–34.0)
MCHC: 35.8 g/dL (ref 30.0–36.0)
MCV: 86.4 fL (ref 78.0–100.0)
Platelets: 178 10*3/uL (ref 150–400)
RBC: 3.17 MIL/uL — ABNORMAL LOW (ref 4.22–5.81)
RDW: 13.2 % (ref 11.5–15.5)
WBC: 10.2 10*3/uL (ref 4.0–10.5)

## 2017-09-20 LAB — RAPID URINE DRUG SCREEN, HOSP PERFORMED
Amphetamines: NOT DETECTED
Barbiturates: NOT DETECTED
Benzodiazepines: POSITIVE — AB
Cocaine: NOT DETECTED
Opiates: POSITIVE — AB
Tetrahydrocannabinol: NOT DETECTED

## 2017-09-20 LAB — ACETAMINOPHEN LEVEL: Acetaminophen (Tylenol), Serum: <10 ug/mL — ABNORMAL LOW (ref 10–30)

## 2017-09-20 LAB — ETHANOL: Alcohol, Ethyl (B): <10 mg/dL (ref <10)

## 2017-09-20 LAB — SALICYLATE LEVEL: Salicylate Lvl: <7 mg/dL (ref 2.8–30.0)

## 2017-09-20 MED ORDER — METOPROLOL SUCCINATE ER 50 MG PO TB24
50.0000 mg | ORAL_TABLET | Freq: Every day | ORAL | Status: DC
  Administered 2017-09-22 – 2017-09-26 (×5): 50 mg via ORAL
  Filled 2017-09-20 (×6): qty 1

## 2017-09-20 MED ORDER — LISINOPRIL 20 MG PO TABS
40.0000 mg | ORAL_TABLET | Freq: Every day | ORAL | Status: DC
  Filled 2017-09-20: qty 2

## 2017-09-20 MED ORDER — PANTOPRAZOLE SODIUM 40 MG PO TBEC
40.0000 mg | DELAYED_RELEASE_TABLET | Freq: Every day | ORAL | Status: DC
  Administered 2017-09-21 – 2017-09-26 (×6): 40 mg via ORAL
  Filled 2017-09-20 (×6): qty 1

## 2017-09-20 MED ORDER — SUCRALFATE 1 G PO TABS
1.0000 g | ORAL_TABLET | Freq: Four times a day (QID) | ORAL | Status: DC
  Administered 2017-09-20 – 2017-09-26 (×21): 1 g via ORAL
  Filled 2017-09-20 (×21): qty 1

## 2017-09-20 MED ORDER — ATORVASTATIN CALCIUM 10 MG PO TABS
10.0000 mg | ORAL_TABLET | Freq: Every day | ORAL | Status: DC
  Administered 2017-09-21 – 2017-09-25 (×5): 10 mg via ORAL
  Filled 2017-09-20 (×5): qty 1

## 2017-09-20 MED ORDER — CYCLOBENZAPRINE HCL 10 MG PO TABS: 10.0000 mg | ORAL_TABLET | Freq: Every evening | ORAL | Status: DC | PRN

## 2017-09-20 MED ORDER — INSULIN DETEMIR 100 UNIT/ML ~~LOC~~ SOLN
10.0000 [IU] | Freq: Every day | SUBCUTANEOUS | Status: DC
  Administered 2017-09-21: 10 [IU] via SUBCUTANEOUS
  Filled 2017-09-20: qty 0.1

## 2017-09-20 MED ORDER — BRIMONIDINE TARTRATE 0.15 % OP SOLN
1.0000 [drp] | Freq: Two times a day (BID) | OPHTHALMIC | Status: DC
  Administered 2017-09-21 – 2017-09-26 (×11): 1 [drp] via OPHTHALMIC
  Filled 2017-09-20: qty 5

## 2017-09-20 MED ORDER — DICYCLOMINE HCL 10 MG PO CAPS
10.0000 mg | ORAL_CAPSULE | Freq: Three times a day (TID) | ORAL | Status: DC
  Administered 2017-09-20 – 2017-09-26 (×21): 10 mg via ORAL
  Filled 2017-09-20 (×21): qty 1

## 2017-09-20 MED ORDER — ZIPRASIDONE MESYLATE 20 MG IM SOLR
INTRAMUSCULAR | Status: AC
  Filled 2017-09-20: qty 20

## 2017-09-20 MED ORDER — ZIPRASIDONE MESYLATE 20 MG IM SOLR
20.0000 mg | Freq: Once | INTRAMUSCULAR | Status: AC
  Administered 2017-09-20: 20 mg via INTRAMUSCULAR

## 2017-09-20 MED ORDER — POTASSIUM CHLORIDE 10 MEQ/100ML IV SOLN
10.0000 meq | INTRAVENOUS | Status: AC
  Administered 2017-09-21 (×2): 10 meq via INTRAVENOUS
  Filled 2017-09-20 (×2): qty 100

## 2017-09-20 MED ORDER — LORAZEPAM 0.5 MG PO TABS: 0.5000 mg | ORAL_TABLET | Freq: Every evening | ORAL | Status: DC | PRN

## 2017-09-20 MED ORDER — GABAPENTIN 300 MG PO CAPS
600.0000 mg | ORAL_CAPSULE | Freq: Three times a day (TID) | ORAL | Status: DC | PRN
  Administered 2017-09-23: 600 mg via ORAL
  Filled 2017-09-20: qty 2

## 2017-09-20 MED ORDER — TIMOLOL MALEATE 0.5 % OP SOLN
1.0000 [drp] | Freq: Two times a day (BID) | OPHTHALMIC | Status: DC
  Administered 2017-09-21 – 2017-09-26 (×12): 1 [drp] via OPHTHALMIC
  Filled 2017-09-20: qty 5

## 2017-09-20 MED ORDER — SODIUM CHLORIDE 0.9 % IV BOLUS (SEPSIS)
1000.0000 mL | Freq: Once | INTRAVENOUS | Status: AC
  Administered 2017-09-21: 1000 mL via INTRAVENOUS

## 2017-09-20 MED ORDER — LORATADINE 10 MG PO TABS
10.0000 mg | ORAL_TABLET | Freq: Every day | ORAL | Status: DC
  Administered 2017-09-21 – 2017-09-26 (×6): 10 mg via ORAL
  Filled 2017-09-20 (×6): qty 1

## 2017-09-20 MED ORDER — POTASSIUM CHLORIDE CRYS ER 20 MEQ PO TBCR
40.0000 meq | EXTENDED_RELEASE_TABLET | Freq: Two times a day (BID) | ORAL | Status: DC
  Administered 2017-09-20 – 2017-09-21 (×2): 40 meq via ORAL
  Filled 2017-09-20 (×2): qty 2

## 2017-09-20 MED ORDER — METOCLOPRAMIDE HCL 10 MG PO TABS: 10.0000 mg | ORAL_TABLET | Freq: Three times a day (TID) | ORAL | Status: DC | PRN

## 2017-09-20 MED ORDER — HYDROCHLOROTHIAZIDE 12.5 MG PO CAPS: 12.5000 mg | ORAL_CAPSULE | Freq: Every day | ORAL | Status: DC

## 2017-09-20 NOTE — ED Notes (Signed)
Date and time results received: 09/20/17 2314 (use smartphrase ".now" to insert current time)  Test: potassium Critical Value: 2.4  Name of Provider Notified: Fredderick Phenix.  Orders Received? Or Actions Taken?: informed Claiborne Billings H. no order given
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
## 2017-09-20 NOTE — ED Notes (Signed)
Pt wandering in halls, will not walk back to room, went through lobby ED doors. Was able to talk patient back into room. Notified PA of the same, security at the bedside and IVC papers bing drawn. Lab work in progress

## 2017-09-20 NOTE — H&P (Signed)
Behavioral Health Medical Screening Exam  Timothy Clark is an 47 y.o. male.  Total Time spent with patient: 20 minutes  Psychiatric Specialty Exam: Physical Exam  Constitutional: He is oriented to person, place, and time. He appears well-developed and well-nourished. No distress.  Eyes: Conjunctivae are normal. Right eye exhibits no discharge. Left eye exhibits no discharge. No scleral icterus.  Cardiovascular: Normal rate.  Respiratory: Effort normal. No respiratory distress.  Neurological: He is alert and oriented to person, place, and time.  Skin: He is not diaphoretic.  Psychiatric: His speech is normal. His mood appears anxious. He is not withdrawn and not actively hallucinating. Thought content is not paranoid and not delusional. Cognition and memory are normal. He expresses impulsivity and inappropriate judgment. He exhibits a depressed mood. He expresses homicidal and suicidal ideation. He expresses suicidal plans. He expresses no homicidal plans.    Review of Systems  Constitutional: Negative for chills, fever and weight loss.  Respiratory: Negative for shortness of breath.   Cardiovascular: Negative for chest pain.  Gastrointestinal: Positive for abdominal pain.  Psychiatric/Behavioral: Positive for depression and suicidal ideas. Negative for hallucinations, memory loss and substance abuse. The patient is nervous/anxious and has insomnia.   All other systems reviewed and are negative.   Blood pressure (!) 90/54, pulse 79, temperature 98.9 F (37.2 C), SpO2 100 %.There is no height or weight on file to calculate BMI.  General Appearance: Casual and Fairly Groomed  Eye Contact:  Fair  Speech:  Clear and Coherent and Normal Rate  Volume:  Normal  Mood:  Anxious, Depressed, Dysphoric, Hopeless, Irritable and Worthless  Affect:  Congruent and Depressed  Thought Process:  Coherent and Descriptions of Associations: Intact  Orientation:  Full (Time, Place, and Person)  Thought  Content:  Logical and Hallucinations: None  Suicidal Thoughts:  Yes.  with intent/plan  Homicidal Thoughts:  Yes.  without intent/plan  Memory:  Immediate;   Good Recent;   Fair  Judgement:  Impaired  Insight:  Lacking  Psychomotor Activity:  Normal  Concentration: Concentration: Fair and Attention Span: Fair  Recall:  [employer] of Knowledge:Good  Language: Good  Akathisia:  No  Handed:  Right  AIMS (if indicated):     Assets:  Communication Skills Desire for Improvement Financial Resources/Insurance Housing Leisure Time  Sleep:       Musculoskeletal: Strength & Muscle Tone: within normal limits Gait & Station: normal    Blood pressure (!) 90/54, pulse 79, temperature 98.9 F (37.2 C), SpO2 100 %.  Recommendations:  Due to patient's medical history, will send to [facility] for medical clearance.  Rozetta Nunnery, NP 09/20/2017, 10:33 PM

## 2017-09-20 NOTE — ED Triage Notes (Signed)
Pt arrives from [facility] where he has already been assessed by TTS for SI. He is here for medical clearance. See note by counselor. He admits to SI.

## 2017-09-20 NOTE — ED Provider Notes (Signed)
[facility] DEPT Provider Note   CSN: [id number] Arrival date & time: 09/20/17  2047     History   Chief Complaint Chief Complaint  Patient presents with  . Medical Clearance    HPI Timothy Clark is a 47 y.o. male.   47 year old male with history of diabetes, glaucoma, esophageal reflux, hypertension, chronic abdominal pain presents to the emergency department for medical clearance.  He was evaluated at [facility] for suicidal ideations.  Suicidal ideations have been worsening over the last 2 weeks.  Plan documented as attempt to jump out in front of traffic.  He does continue to report suicidal ideations.  He denies any homicidal ideations.  Patient is here voluntarily.  He is calm and cooperative.  Patient c/o abdominal pain.  He states that this pain is the same as his daily chronic abdominal pain.  He has not taken his nightly medications for management of these symptoms.  No V/D.  No reported ETOH or drug use.      Past Medical History:  Diagnosis Date  . Chronic abdominal pain   . Esophagitis   . Eye hemorrhage   . Gastritis   . Gastroparesis   . GERD (gastroesophageal reflux disease)   . Glaucoma   . Helicobacter pylori (H. pylori) infection 2016   ERADICATION DOCUMENTED VIA STOOL TEST in AUG 2016 at [place]  . Hiccups   . Hypertension   . Nausea and vomiting    chronic, recurrent  . Type 2 diabetes mellitus with complications (HCC)    diagnosed around age 40    Patient Active Problem List   Diagnosis Date Noted  . Diabetic foot infection ([place]) 07/15/2017  . Sepsis due to cellulitis ([place]) 07/14/2017  . GERD (gastroesophageal reflux disease) 07/14/2017  . Glaucoma 07/14/2017  . Anemia 07/14/2017  . Intractable nausea and vomiting 06/10/2017  . Pressure injury of skin 02/01/2017  . Psychosis ([place]) 01/31/2017  . Hypokalemia 01/31/2017  . AKI (acute kidney injury) ([place]) 09/07/2016  . Insulin dependent  diabetes mellitus ([place]) 09/07/2016  . Orthostatic syncope 09/05/2016  . Syncope and collapse   . 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 Diarrhea 09/27/2015  . Gastroparesis due to DM ([place]) 02/06/2015  . Intractable vomiting 01/15/2015  . Gastritis   . Essential hypertension 12/06/2014  . H. pylori infection 11/27/2014  . Mucosal abnormality of stomach   . Mucosal abnormality of esophagus   . Nausea vomiting and diarrhea 10/25/2014  . DIABETIC FOOT ULCER with Osteomyelitis 02/14/2010  . LIMB PAIN 12/11/2009  . UNSPECIFIED PERIPHERAL VASCULAR DISEASE 10/19/2009  . ERECTILE DYSFUNCTION, ORGANIC 10/19/2009  . NUMBNESS 07/20/2009  . Diabetes mellitus type 2 with complications, uncontrolled ([place]) 03/22/2009  . Hyperlipidemia 03/22/2009  . Depression 03/22/2009    Past Surgical History:  Procedure Laterality Date  . CATARACT EXTRACTION W/PHACO Left 05/19/2016   Procedure: CATARACT EXTRACTION PHACO AND INTRAOCULAR LENS PLACEMENT LEFT EYE;  Surgeon: Tonny Branch, MD;  Location: [facility];  Service: Ophthalmology;  Laterality: Left;  CDE: 7.30  . CATARACT EXTRACTION W/PHACO Right 06/23/2016   Procedure: CATARACT EXTRACTION PHACO AND INTRAOCULAR LENS PLACEMENT RIGHT EYE CDE=9.87;  Surgeon: Tonny Branch, MD;  Location: [facility];  Service: Ophthalmology;  Laterality: Right;  right  . ESOPHAGOGASTRODUODENOSCOPY  2015   Dr. Britta Mccreedy  . ESOPHAGOGASTRODUODENOSCOPY N/A 10/26/2014   RMR: Distal esophagititis-likely reflux related although an element of pill induced injuruy no exclueded  status post biopsy. Diffusely abnormal gastric mucosa of uncertain signigicane -status post  gastric biopsy. Focal area of excoriation in the cardia most consistant with a trauma of heaving.   . ESOPHAGOGASTRODUODENOSCOPY  08/2015   [facility]: mild esophagitis and old gastric contents  . EYE SURGERY  2015   stent placed to left eye  . EYE SURGERY    . INCISION AND DRAINAGE OF WOUND Right 07/16/2017   Procedure: IRRIGATION AND DEBRIDEMENT  OF SOFT TISSUE OF ULCERATION RIGHT FOOT;  Surgeon: Caprice Beaver, DPM;  Location: [facility];  Service: Podiatry;  Laterality: Right;       Home Medications    Prior to Admission medications   Medication Sig Start Date End Date Taking? Authorizing Provider  atorvastatin (LIPITOR) 10 MG tablet Take 10 mg by mouth every evening.    Yes [provider]  brimonidine (ALPHAGAN) 0.15 % ophthalmic solution Place 1 drop into both eyes 2 (two) times daily.    Yes [provider]  cetirizine (ZYRTEC) 10 MG tablet Take 10 mg by mouth daily.   Yes [provider]  cyclobenzaprine (FLEXERIL) 10 MG tablet Take 10 mg by mouth at bedtime as needed for muscle spasms.    Yes [provider]  dicyclomine (BENTYL) 10 MG capsule TAKE 1 CAPSULE BY MOUTH 4 TIMES DAILY BEFORE MEAL(S) AND AT BEDTIME 08/06/17  Yes Carlis Stable, NP  gabapentin (NEURONTIN) 600 MG tablet Take 600 mg by mouth 3 (three) times daily with meals as needed (nerve pain).    Yes [provider]  gentamicin cream (GARAMYCIN) 0.1 % Apply 1 application topically 3 (three) times daily. 09/02/17  Yes Edrick Kins, DPM  hydrochlorothiazide (HYDRODIURIL) 12.5 MG tablet Take 1 tablet (12.5 mg total) by mouth daily. 07/18/17 09/20/17 Yes Johnson, Clanford L, MD  insulin detemir (LEVEMIR) 100 UNIT/ML injection Inject 10 Units into the skin at bedtime.    Yes [provider]  insulin NPH-regular Human (NOVOLIN 70/30) (70-30) 100 UNIT/ML injection Inject 35 Units into the skin 2 (two) times daily.   Yes [provider]  lisinopril (PRINIVIL,ZESTRIL) 40 MG tablet Take 40 mg by mouth daily.   Yes [provider]  LORazepam (ATIVAN) 0.5 MG tablet Take 0.5 mg by mouth at bedtime as needed for anxiety.   Yes [provider]  metoprolol succinate (TOPROL-XL) 50 MG 24 hr tablet Take 50 mg by mouth daily. Take with or immediately following a meal.   Yes [provider]    pantoprazole (PROTONIX) 40 MG tablet TAKE 1 TABLET BY MOUTH TWICE DAILY BEFORE  A  MEAL 03/23/17  Yes Mahala Menghini, PA-C  potassium chloride SA (K-DUR,KLOR-CON) 20 MEQ tablet Take 2 tablets (40 mEq total) 2 (two) times daily by mouth. 06/16/17  Yes Eber Jones, MD  sucralfate (CARAFATE) 1 g tablet TAKE ONE TABLET BY MOUTH 4 TIMES DAILY 03/12/16  Yes Annitta Needs, NP  timolol (TIMOPTIC) 0.5 % ophthalmic solution Place 1 drop into both eyes 2 (two) times daily.   Yes [provider]  ciprofloxacin (CIPRO) 500 MG tablet Take 1 tablet (500 mg total) by mouth 2 (two) times daily. Patient not taking: Reported on 09/20/2017 08/26/17   Edrick Kins, DPM  metoCLOPramide (REGLAN) 10 MG tablet Take 1 tablet (10 mg total) by mouth 3 (three) times daily before meals. Patient not taking: Reported on 09/20/2017 08/14/17   Annitta Needs, NP  ondansetron (ZOFRAN) 4 MG tablet Take 1 tablet (4 mg total) every 6 (six) hours by mouth. Patient not taking: Reported on  09/20/2017 06/16/17   Eber Jones, MD    Family History Family History  Problem Relation Age of Onset  . Ovarian cancer Mother   . Heart attack Father   . Cervical cancer Sister   . Diabetes Sister   . Colon cancer Neg Hx     Social History Social History   Tobacco Use  . Smoking status: Never Smoker  . Smokeless tobacco: Never Used  Substance Use Topics  . Alcohol use: No    Alcohol/week: 0.0 oz  . Drug use: No     Allergies   Donnatal [pb-hyoscy-atropine-scopol er]; Fish allergy; Haldol [haloperidol lactate]; Lidocaine; Maalox [calcium carbonate antacid]; Aspirin; Bactrim [sulfamethoxazole-trimethoprim]; Keflex [cephalexin]; Penicillins; and Phenergan [promethazine hcl]   Review of Systems Review of Systems Ten systems reviewed and are negative for acute change, except as noted in the HPI.    Physical Exam Updated Vital Signs BP (!) 208/99   Pulse 88   Temp 99.5 F (37.5 C) (Rectal)   Resp 12    SpO2 99%   Physical Exam  Constitutional: He is oriented to person, place, and time. He appears well-developed and well-nourished. No distress.  Nontoxic  HENT:  Head: Normocephalic and atraumatic.  Eyes: Conjunctivae and EOM are normal. No scleral icterus.  Neck: Normal range of motion.  Cardiovascular: Normal rate, regular rhythm and intact distal pulses.  Pulmonary/Chest: Effort normal. No stridor. No respiratory distress. He has no wheezes.  Lungs CTAB  Musculoskeletal: Normal range of motion.  Neurological: He is alert and oriented to person, place, and time. He exhibits normal muscle tone. Coordination normal.  Skin: Skin is warm and dry. No rash noted. He is not diaphoretic. No erythema. No pallor.  Psychiatric: He has a normal mood and affect. His behavior is normal. He expresses suicidal ideation.  Patient calm and cooperative  Nursing note and vitals reviewed.    ED Treatments / Results  Labs (all labs ordered are listed, but only abnormal results are displayed) Labs Reviewed  COMPREHENSIVE METABOLIC PANEL - Abnormal; Notable for the following components:      Result Value   Sodium 133 (*)    Potassium 2.4 (*)    Chloride 94 (*)    Glucose, Bld 198 (*)    Creatinine, Ser 2.47 (*)    Calcium 8.3 (*)    Albumin 3.3 (*)    ALT 14 (*)    GFR calc non Af Amer 29 (*)    GFR calc Af Amer 34 (*)    All other components within normal limits  ACETAMINOPHEN LEVEL - Abnormal; Notable for the following components:   Acetaminophen (Tylenol), Serum <10 (*)    All other components within normal limits  CBC - Abnormal; Notable for the following components:   RBC 3.17 (*)    Hemoglobin 9.8 (*)    HCT 27.4 (*)    All other components within normal limits  RAPID URINE DRUG SCREEN, HOSP PERFORMED - Abnormal; Notable for the following components:   Opiates POSITIVE (*)    Benzodiazepines POSITIVE (*)    All other components within normal limits  CBC WITH DIFFERENTIAL/PLATELET -  Abnormal; Notable for the following components:   RBC 3.24 (*)    Hemoglobin 10.2 (*)    HCT 28.1 (*)    MCHC 36.3 (*)    Platelets 146 (*)    All other components within normal limits  COMPREHENSIVE METABOLIC PANEL - Abnormal; Notable for the following components:   Sodium 134 (*)  Potassium 2.6 (*)    Chloride 98 (*)    Glucose, Bld 212 (*)    Creatinine, Ser 2.05 (*)    Calcium 7.9 (*)    Total Protein 6.2 (*)    Albumin 3.0 (*)    ALT 12 (*)    GFR calc non Af Amer 37 (*)    GFR calc Af Amer 43 (*)    All other components within normal limits  TROPONIN I - Abnormal; Notable for the following components:   Troponin I 0.04 (*)    All other components within normal limits  ETHANOL  SALICYLATE LEVEL  MAGNESIUM  CK    EKG  EKG Interpretation  Date/Time:  Monday September 21 2017 04:16:55 EST Ventricular Rate:  84 PR Interval:    QRS Duration: 74 QT Interval:  447 QTC Calculation: 529 R Axis:   28 Text Interpretation:  Sinus rhythm Anteroseptal infarct, old Prolonged QT interval QT shorter than measured  Confirmed by Ezequiel Essex [phone]) on 09/21/2017 4:30:02 AM       Radiology Ct Head Wo Contrast  Result Date: 09/21/2017 CLINICAL DATA:  47 y/o M; [facility] for medical clearance, altered level of consciousness. EXAM: CT HEAD WITHOUT CONTRAST TECHNIQUE: Contiguous axial images were obtained from the base of the skull through the vertex without intravenous contrast. COMPARISON:  08/30/2017 CT head. FINDINGS: Brain: No evidence of acute infarction, hemorrhage, hydrocephalus, extra-axial collection or mass lesion/mass effect. Vascular: Calcific atherosclerosis of carotid siphons. No hyperdense vessel identified. Skull: Small focus of scalp scarring in the right parietal region is stable. No calvarial fracture. Sinuses/Orbits: No acute finding. Other: Bilateral intra-ocular lens replacement and left glaucoma device. IMPRESSION: Stable negative CT of the  head. Electronically Signed   By: Kristine Garbe M.D.   On: 09/21/2017 05:38    Procedures Procedures (including critical care time)  Medications Ordered in ED Medications  atorvastatin (LIPITOR) tablet 10 mg (not administered)  brimonidine (ALPHAGAN) 0.15 % ophthalmic solution 1 drop (not administered)  loratadine (CLARITIN) tablet 10 mg (not administered)  cyclobenzaprine (FLEXERIL) tablet 10 mg (not administered)  dicyclomine (BENTYL) capsule 10 mg (10 mg Oral Given 09/20/17 2254)  gabapentin (NEURONTIN) capsule 600 mg (not administered)  hydrochlorothiazide (MICROZIDE) capsule 12.5 mg (not administered)  insulin detemir (LEVEMIR) injection 10 Units (10 Units Subcutaneous Given 09/21/17 0025)  lisinopril (PRINIVIL,ZESTRIL) tablet 40 mg (not administered)  LORazepam (ATIVAN) tablet 0.5 mg (not administered)  metoCLOPramide (REGLAN) tablet 10 mg (not administered)  metoprolol succinate (TOPROL-XL) 24 hr tablet 50 mg (not administered)  pantoprazole (PROTONIX) EC tablet 40 mg (not administered)  sucralfate (CARAFATE) tablet 1 g (1 g Oral Given 09/20/17 2254)  timolol (TIMOPTIC) 0.5 % ophthalmic solution 1 drop (not administered)  potassium chloride SA (K-DUR,KLOR-CON) CR tablet 40 mEq (40 mEq Oral Given 09/20/17 2254)  insulin aspart protamine- aspart (NOVOLOG MIX 70/30) injection 35 Units (not administered)  sodium chloride 0.9 % bolus 1,000 mL (0 mLs Intravenous Stopped 09/21/17 0123)  potassium chloride 10 mEq in 100 mL IVPB (0 mEq Intravenous Stopped 09/21/17 0232)  ziprasidone (GEODON) injection 20 mg (20 mg Intramuscular Given 09/20/17 2346)  metoprolol tartrate (LOPRESSOR) injection 5 mg (5 mg Intravenous Given 09/21/17 0259)  hydrALAZINE (APRESOLINE) injection 10 mg (10 mg Intravenous Given 09/21/17 0419)  sodium chloride 0.9 % bolus 1,000 mL (0 mLs Intravenous Stopped 09/21/17 0533)  LORazepam (ATIVAN) injection 2 mg (2 mg Intravenous Given 09/21/17 0411)  diphenhydrAMINE  (BENADRYL) injection 50 mg (50 mg Intravenous Given 09/21/17  [email or web address])    Initial Impression / Assessment and Plan / ED Course  I have reviewed the triage vital signs and the nursing notes.  Pertinent labs & imaging results that were available during my care of the patient were reviewed by me and considered in my medical decision making (see chart for details).     11:40 PM Patient attempting to leave the ED. IVC papers taken out. Geodon ordered for sedation as patient combative.  Security called to bedside.  Potassium noted to be low. This seems like an ongoing issue. Will order IV potassium x 2. Creatinine also up from baseline. Will hydrate. Labs otherwise appear at baseline.  3:00 AM Lopressor ordered for BP management.   4:02 AM Patient attempting to get out of bed. Difficult to redirect back to the bed. Required assistance by this writer, MD, nursing, and security. C/o abdominal pain which is chronic. Denies headache or other pain. Ativan and Benadryl ordered. He has also been ordered an additional dose of IV hydralazine for blood pressure control.  Given persistent difficulty with redirection, subjective confusion intermittently will obtain head CT.   5:44 AM Repeat potassium has improved, but is still low.  Creatinine is trending toward baseline.  CK is reassuring and head CT negative for acute intracranial abnormality.  Persistent hypertension may be secondary to need for morning medications; however, patient has had very little response to IV metoprolol and hydralazine.  Labetalol ordered as well as additional potassium.    Unable to provide medical clearance for transfer to [facility] for [facility].  Plan for [facility] admission; case discussed with Dr. Alcario Drought.  Patient appropriate for transfer to [facility] once appropriately medically cleared.   Final Clinical Impressions(s) / ED Diagnoses   Final diagnoses:  Hypokalemia  Hypertensive urgency  AKI (acute kidney injury) [facility])  Suicidal  ideations    ED Discharge Orders    None       Antonietta Breach, PA-C 09/21/17 0551    Ezequiel Essex, MD 09/21/17 [phone]

## 2017-09-20 NOTE — ED Notes (Signed)
Bed: [facility] Expected date:  Expected time:  Means of arrival:  Comments: No bed 

## 2017-09-21 ENCOUNTER — Inpatient Hospital Stay (HOSPITAL_COMMUNITY): Admission: AD | Admit: 2017-09-21 | Payer: Medicare HMO | Source: Intra-hospital | Admitting: Psychiatry

## 2017-09-21 ENCOUNTER — Encounter (HOSPITAL_COMMUNITY): Payer: Self-pay | Admitting: Family Medicine

## 2017-09-21 ENCOUNTER — Emergency Department (HOSPITAL_COMMUNITY): Payer: Medicare HMO

## 2017-09-21 ENCOUNTER — Encounter: Payer: Medicare HMO | Admitting: Podiatry

## 2017-09-21 DIAGNOSIS — E118 Type 2 diabetes mellitus with unspecified complications: Secondary | ICD-10-CM

## 2017-09-21 DIAGNOSIS — R45851 Suicidal ideations: Secondary | ICD-10-CM | POA: Diagnosis not present

## 2017-09-21 DIAGNOSIS — Z794 Long term (current) use of insulin: Secondary | ICD-10-CM

## 2017-09-21 DIAGNOSIS — F329 Major depressive disorder, single episode, unspecified: Secondary | ICD-10-CM | POA: Diagnosis not present

## 2017-09-21 DIAGNOSIS — R9431 Abnormal electrocardiogram [ECG] [EKG]: Secondary | ICD-10-CM

## 2017-09-21 DIAGNOSIS — E119 Type 2 diabetes mellitus without complications: Secondary | ICD-10-CM | POA: Diagnosis not present

## 2017-09-21 DIAGNOSIS — N179 Acute kidney failure, unspecified: Secondary | ICD-10-CM | POA: Diagnosis not present

## 2017-09-21 DIAGNOSIS — I10 Essential (primary) hypertension: Secondary | ICD-10-CM | POA: Diagnosis not present

## 2017-09-21 DIAGNOSIS — I160 Hypertensive urgency: Secondary | ICD-10-CM | POA: Diagnosis present

## 2017-09-21 DIAGNOSIS — R109 Unspecified abdominal pain: Secondary | ICD-10-CM | POA: Diagnosis not present

## 2017-09-21 DIAGNOSIS — E1165 Type 2 diabetes mellitus with hyperglycemia: Secondary | ICD-10-CM

## 2017-09-21 DIAGNOSIS — K3184 Gastroparesis: Secondary | ICD-10-CM

## 2017-09-21 DIAGNOSIS — K219 Gastro-esophageal reflux disease without esophagitis: Secondary | ICD-10-CM | POA: Diagnosis not present

## 2017-09-21 DIAGNOSIS — E876 Hypokalemia: Secondary | ICD-10-CM | POA: Diagnosis not present

## 2017-09-21 DIAGNOSIS — E785 Hyperlipidemia, unspecified: Secondary | ICD-10-CM | POA: Diagnosis not present

## 2017-09-21 DIAGNOSIS — E1143 Type 2 diabetes mellitus with diabetic autonomic (poly)neuropathy: Secondary | ICD-10-CM

## 2017-09-21 DIAGNOSIS — G8929 Other chronic pain: Secondary | ICD-10-CM | POA: Diagnosis present

## 2017-09-21 LAB — BASIC METABOLIC PANEL
Anion gap: 11 (ref 5–15)
BUN: 16 mg/dL (ref 6–20)
CO2: 25 mmol/L (ref 22–32)
Calcium: 8.1 mg/dL — ABNORMAL LOW (ref 8.9–10.3)
Chloride: 103 mmol/L (ref 101–111)
Creatinine, Ser: 2 mg/dL — ABNORMAL HIGH (ref 0.61–1.24)
GFR calc Af Amer: 44 mL/min — ABNORMAL LOW (ref >60)
GFR calc non Af Amer: 38 mL/min — ABNORMAL LOW (ref >60)
Glucose, Bld: 207 mg/dL — ABNORMAL HIGH (ref 65–99)
Potassium: 3 mmol/L — ABNORMAL LOW (ref 3.5–5.1)
Sodium: 139 mmol/L (ref 135–145)

## 2017-09-21 LAB — CBC WITH DIFFERENTIAL/PLATELET
Basophils Absolute: 0 10*3/uL (ref 0.0–0.1)
Basophils Relative: 0 %
Eosinophils Absolute: 0.1 10*3/uL (ref 0.0–0.7)
Eosinophils Relative: 1 %
HCT: 28.1 % — ABNORMAL LOW (ref 39.0–52.0)
Hemoglobin: 10.2 g/dL — ABNORMAL LOW (ref 13.0–17.0)
Lymphocytes Relative: 19 %
Lymphs Abs: 1.6 10*3/uL (ref 0.7–4.0)
MCH: 31.5 pg (ref 26.0–34.0)
MCHC: 36.3 g/dL — ABNORMAL HIGH (ref 30.0–36.0)
MCV: 86.7 fL (ref 78.0–100.0)
Monocytes Absolute: 0.6 10*3/uL (ref 0.1–1.0)
Monocytes Relative: 7 %
Neutro Abs: 6.5 10*3/uL (ref 1.7–7.7)
Neutrophils Relative %: 73 %
Platelets: 146 10*3/uL — ABNORMAL LOW (ref 150–400)
RBC: 3.24 MIL/uL — ABNORMAL LOW (ref 4.22–5.81)
RDW: 13.5 % (ref 11.5–15.5)
WBC: 8.7 10*3/uL (ref 4.0–10.5)

## 2017-09-21 LAB — CBG MONITORING, ED
Glucose-Capillary: 205 mg/dL — ABNORMAL HIGH (ref 65–99)
Glucose-Capillary: 138 mg/dL — ABNORMAL HIGH (ref 65–99)
Glucose-Capillary: 119 mg/dL — ABNORMAL HIGH (ref 65–99)

## 2017-09-21 LAB — COMPREHENSIVE METABOLIC PANEL
ALT: 12 U/L — ABNORMAL LOW (ref 17–63)
AST: 19 U/L (ref 15–41)
Albumin: 3 g/dL — ABNORMAL LOW (ref 3.5–5.0)
Alkaline Phosphatase: 48 U/L (ref 38–126)
Anion gap: 11 (ref 5–15)
BUN: 17 mg/dL (ref 6–20)
CO2: 25 mmol/L (ref 22–32)
Calcium: 7.9 mg/dL — ABNORMAL LOW (ref 8.9–10.3)
Chloride: 98 mmol/L — ABNORMAL LOW (ref 101–111)
Creatinine, Ser: 2.05 mg/dL — ABNORMAL HIGH (ref 0.61–1.24)
GFR calc Af Amer: 43 mL/min — ABNORMAL LOW (ref >60)
GFR calc non Af Amer: 37 mL/min — ABNORMAL LOW (ref >60)
Glucose, Bld: 212 mg/dL — ABNORMAL HIGH (ref 65–99)
Potassium: 2.6 mmol/L — CL (ref 3.5–5.1)
Sodium: 134 mmol/L — ABNORMAL LOW (ref 135–145)
Total Bilirubin: 0.7 mg/dL (ref 0.3–1.2)
Total Protein: 6.2 g/dL — ABNORMAL LOW (ref 6.5–8.1)

## 2017-09-21 LAB — MAGNESIUM: Magnesium: 1.9 mg/dL (ref 1.7–2.4)

## 2017-09-21 LAB — TROPONIN I
Troponin I: 0.04 ng/mL (ref <0.03)
Troponin I: 0.04 ng/mL (ref <0.03)

## 2017-09-21 LAB — CK: Total CK: 171 U/L (ref 49–397)

## 2017-09-21 MED ORDER — ACETAMINOPHEN 325 MG PO TABS: 650.0000 mg | ORAL_TABLET | Freq: Four times a day (QID) | ORAL | Status: DC | PRN

## 2017-09-21 MED ORDER — INSULIN ASPART PROT & ASPART (70-30 MIX) 100 UNIT/ML ~~LOC~~ SUSP
25.0000 [IU] | Freq: Two times a day (BID) | SUBCUTANEOUS | Status: DC
  Administered 2017-09-21 – 2017-09-26 (×10): 25 [IU] via SUBCUTANEOUS
  Filled 2017-09-21: qty 10

## 2017-09-21 MED ORDER — SODIUM CHLORIDE 0.9 % IV BOLUS (SEPSIS)
1000.0000 mL | Freq: Once | INTRAVENOUS | Status: AC
  Administered 2017-09-21: 1000 mL via INTRAVENOUS

## 2017-09-21 MED ORDER — THIAMINE HCL 100 MG/ML IJ SOLN: 100.0000 mg | Freq: Every day | INTRAMUSCULAR | Status: DC

## 2017-09-21 MED ORDER — LORAZEPAM 1 MG PO TABS
0.0000 mg | ORAL_TABLET | Freq: Four times a day (QID) | ORAL | Status: AC
  Administered 2017-09-22 (×3): 1 mg via ORAL
  Administered 2017-09-23: 2 mg via ORAL
  Filled 2017-09-21 (×3): qty 1
  Filled 2017-09-21: qty 2

## 2017-09-21 MED ORDER — DIPHENHYDRAMINE HCL 50 MG/ML IJ SOLN
50.0000 mg | Freq: Once | INTRAMUSCULAR | Status: DC
  Filled 2017-09-21: qty 1

## 2017-09-21 MED ORDER — LORAZEPAM 1 MG PO TABS
0.0000 mg | ORAL_TABLET | Freq: Two times a day (BID) | ORAL | Status: AC
  Administered 2017-09-23 – 2017-09-24 (×2): 2 mg via ORAL
  Filled 2017-09-21 (×2): qty 2

## 2017-09-21 MED ORDER — INSULIN ASPART PROT & ASPART (70-30 MIX) 100 UNIT/ML ~~LOC~~ SUSP
35.0000 [IU] | Freq: Two times a day (BID) | SUBCUTANEOUS | Status: DC
  Filled 2017-09-21: qty 10

## 2017-09-21 MED ORDER — ACETAMINOPHEN 650 MG RE SUPP: 650.0000 mg | Freq: Four times a day (QID) | RECTAL | Status: DC | PRN

## 2017-09-21 MED ORDER — LABETALOL HCL 5 MG/ML IV SOLN
10.0000 mg | Freq: Once | INTRAVENOUS | Status: AC
  Administered 2017-09-21: 10 mg via INTRAVENOUS
  Filled 2017-09-21: qty 4

## 2017-09-21 MED ORDER — POTASSIUM CHLORIDE 10 MEQ/100ML IV SOLN
10.0000 meq | INTRAVENOUS | Status: AC
  Administered 2017-09-21 (×2): 10 meq via INTRAVENOUS
  Filled 2017-09-21 (×2): qty 100

## 2017-09-21 MED ORDER — LORAZEPAM 2 MG/ML IJ SOLN
2.0000 mg | Freq: Once | INTRAMUSCULAR | Status: AC
  Administered 2017-09-21: 2 mg via INTRAVENOUS
  Filled 2017-09-21: qty 1

## 2017-09-21 MED ORDER — LORAZEPAM 2 MG/ML IJ SOLN
2.0000 mg | Freq: Once | INTRAMUSCULAR | Status: DC
  Filled 2017-09-21: qty 1

## 2017-09-21 MED ORDER — LORAZEPAM 2 MG/ML IJ SOLN: 1.0000 mg | Freq: Once | INTRAMUSCULAR | Status: DC

## 2017-09-21 MED ORDER — METOPROLOL TARTRATE 5 MG/5ML IV SOLN
5.0000 mg | Freq: Once | INTRAVENOUS | Status: AC
  Administered 2017-09-21: 5 mg via INTRAVENOUS
  Filled 2017-09-21: qty 5

## 2017-09-21 MED ORDER — HYDRALAZINE HCL 20 MG/ML IJ SOLN
10.0000 mg | Freq: Once | INTRAMUSCULAR | Status: AC
  Administered 2017-09-21: 10 mg via INTRAVENOUS
  Filled 2017-09-21: qty 1

## 2017-09-21 MED ORDER — HYDRALAZINE HCL 20 MG/ML IJ SOLN
10.0000 mg | INTRAMUSCULAR | Status: DC | PRN
  Administered 2017-09-21 – 2017-09-22 (×3): 10 mg via INTRAVENOUS
  Filled 2017-09-21 (×2): qty 1

## 2017-09-21 MED ORDER — SODIUM CHLORIDE 0.9% FLUSH
3.0000 mL | Freq: Two times a day (BID) | INTRAVENOUS | Status: DC
  Administered 2017-09-22 – 2017-09-25 (×5): 3 mL via INTRAVENOUS

## 2017-09-21 MED ORDER — ENOXAPARIN SODIUM 40 MG/0.4ML ~~LOC~~ SOLN
40.0000 mg | SUBCUTANEOUS | Status: DC
  Administered 2017-09-22 – 2017-09-25 (×5): 40 mg via SUBCUTANEOUS
  Filled 2017-09-21 (×5): qty 0.4

## 2017-09-21 MED ORDER — INSULIN ASPART PROT & ASPART (70-30 MIX) 100 UNIT/ML ~~LOC~~ SUSP: 25.0000 [IU] | Freq: Two times a day (BID) | SUBCUTANEOUS | Status: DC

## 2017-09-21 MED ORDER — VITAMIN B-1 100 MG PO TABS
100.0000 mg | ORAL_TABLET | Freq: Every day | ORAL | Status: DC
  Administered 2017-09-22 – 2017-09-26 (×5): 100 mg via ORAL
  Filled 2017-09-21 (×5): qty 1

## 2017-09-21 MED ORDER — DIPHENHYDRAMINE HCL 50 MG/ML IJ SOLN
50.0000 mg | Freq: Once | INTRAMUSCULAR | Status: AC
  Administered 2017-09-21: 50 mg via INTRAVENOUS

## 2017-09-21 MED ORDER — LORAZEPAM 1 MG PO TABS: 1.0000 mg | ORAL_TABLET | Freq: Four times a day (QID) | ORAL | Status: AC | PRN

## 2017-09-21 MED ORDER — FOLIC ACID 1 MG PO TABS
1.0000 mg | ORAL_TABLET | Freq: Every day | ORAL | Status: DC
  Administered 2017-09-22 – 2017-09-26 (×5): 1 mg via ORAL
  Filled 2017-09-21 (×5): qty 1

## 2017-09-21 MED ORDER — ZIPRASIDONE MESYLATE 20 MG IM SOLR
10.0000 mg | Freq: Once | INTRAMUSCULAR | Status: AC
  Administered 2017-09-21: 10 mg via INTRAMUSCULAR
  Filled 2017-09-21: qty 20

## 2017-09-21 MED ORDER — ZIPRASIDONE MESYLATE 20 MG IM SOLR: 20.0000 mg | Freq: Once | INTRAMUSCULAR | Status: DC

## 2017-09-21 MED ORDER — INSULIN ASPART 100 UNIT/ML ~~LOC~~ SOLN
0.0000 [IU] | Freq: Three times a day (TID) | SUBCUTANEOUS | Status: DC
  Administered 2017-09-22 – 2017-09-26 (×8): 1 [IU] via SUBCUTANEOUS
  Administered 2017-09-26: 2 [IU] via SUBCUTANEOUS

## 2017-09-21 MED ORDER — DIPHENHYDRAMINE HCL 50 MG/ML IJ SOLN: 50.0000 mg | Freq: Once | INTRAMUSCULAR | Status: DC

## 2017-09-21 MED ORDER — INSULIN ASPART 100 UNIT/ML ~~LOC~~ SOLN: 0.0000 [IU] | Freq: Every day | SUBCUTANEOUS | Status: DC

## 2017-09-21 MED ORDER — LORAZEPAM 2 MG/ML IJ SOLN
2.0000 mg | Freq: Once | INTRAMUSCULAR | Status: AC
  Administered 2017-09-21: 2 mg via INTRAVENOUS

## 2017-09-21 MED ORDER — POTASSIUM CHLORIDE CRYS ER 20 MEQ PO TBCR: 40.0000 meq | EXTENDED_RELEASE_TABLET | Freq: Two times a day (BID) | ORAL | Status: AC

## 2017-09-21 MED ORDER — DEXMEDETOMIDINE HCL IN NACL 200 MCG/50ML IV SOLN
0.4000 ug/kg/h | INTRAVENOUS | Status: DC
  Administered 2017-09-21: 0.4 ug/kg/h via INTRAVENOUS
  Filled 2017-09-21: qty 50

## 2017-09-21 MED ORDER — LORAZEPAM 2 MG/ML IJ SOLN
1.0000 mg | Freq: Four times a day (QID) | INTRAMUSCULAR | Status: AC | PRN
  Administered 2017-09-22 (×2): 1 mg via INTRAVENOUS
  Filled 2017-09-21: qty 1

## 2017-09-21 MED ORDER — LORAZEPAM 2 MG/ML IJ SOLN: 2.0000 mg | Freq: Once | INTRAMUSCULAR | Status: DC

## 2017-09-21 MED ORDER — DEXMEDETOMIDINE HCL 200 MCG/2ML IV SOLN
0.4000 ug/kg/h | INTRAVENOUS | Status: DC
  Filled 2017-09-21: qty 2

## 2017-09-21 NOTE — ED Notes (Signed)
This RN was given verbal order by Dr. Bonner Puna at 1530 to place patient back in bilateral ankle and bilateral wrist gurney restraints. Documentation delayed due to patient safety and patient care.

## 2017-09-21 NOTE — Consult Note (Addendum)
[facility] Consult   Reason for Consult:  SI Referring Physician:  Dr. Bonner Puna Patient Identification: Timothy Clark MRN:  [id number] Principal Diagnosis: Depression Diagnosis:   Patient Active Problem List   Diagnosis Date Noted  . Acute kidney injury ([place]) [N17.9] 09/21/2017  . Chronic abdominal pain [R10.9, G89.29] 09/21/2017  . Suicidal ideation [R45.851] 09/21/2017  . Hypertensive urgency [I16.0] 09/21/2017  . Prolonged QT interval [R94.31] 09/21/2017  . Diabetic foot infection ([place]) [I34.742, L08.9] 07/15/2017  . Sepsis due to cellulitis ([place]) [L03.90, A41.9] 07/14/2017  . GERD (gastroesophageal reflux disease) [K21.9] 07/14/2017  . Glaucoma [H40.9] 07/14/2017  . Anemia [D64.9] 07/14/2017  . Intractable nausea and vomiting [R11.2] 06/10/2017  . Pressure injury of skin [L89.90] 02/01/2017  . Psychosis ([place]) [F29] 01/31/2017  . Hypokalemia [E87.6] 01/31/2017  . AKI (acute kidney injury) ([place]) [N17.9] 09/07/2016  . Insulin dependent diabetes mellitus ([place]) [E11.9, Z79.4] 09/07/2016  . Orthostatic syncope [I95.1] 09/05/2016  . Syncope and collapse [R55]   . Dumping syndrome [K91.1] 04/22/2016  . Diarrhea [R19.7] 09/27/2015  . Gastroparesis due to DM ([place]) [V95.63, K31.84] 02/06/2015  . Intractable vomiting [R11.10] 01/15/2015  . Gastritis [K29.70]   . Essential hypertension [I10] 12/06/2014  . H. pylori infection [A04.8] 11/27/2014  . Mucosal abnormality of stomach [K31.89]   . Mucosal abnormality of esophagus [K22.9]   . Nausea vomiting and diarrhea [R11.2, R19.7] 10/25/2014  . DIABETIC FOOT ULCER with Osteomyelitis [L97.409] 02/14/2010  . LIMB PAIN [M79.609] 12/11/2009  . UNSPECIFIED PERIPHERAL VASCULAR DISEASE [I73.9] 10/19/2009  . 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 NUMBNESS [R20.9] 07/20/2009  . Diabetes mellitus type 2 with complications, uncontrolled ([place]) [E11.8, E11.65] 03/22/2009  . Hyperlipidemia [E78.5] 03/22/2009  .  Depression [F32.9] 03/22/2009    Total Time spent with patient: 1 hour  Subjective:   Timothy Clark is a 47 y.o. male patient admitted with acute encephalopathy.  HPI:   Per chart review, patient was admitted to the hospital with acute encephalopathy. He initially presented to [facility] with SI and a plan to walk in front of traffic. He was sent to the hospital for medical clearance. He received Geodon 20 mg overnight for agitation as well as a Precedex infusion. QTc was 529 today. He was IVC'd due to agitation. He required hard restraints following discontinuation of Precedex. His hospital course has been complicated by hypertensive urgency (up to 220/125). Head CT was unremarkable. UDS was positive for benzodiazepines and opiates. PMP indicates regular prescriptions filled for Ativan 1 mg tablet. He last filled Ativan on 2/4 (#60 for 30 days). He last filled a prescription for Norco 5-325 mg on 1/21 (#21 for 7 days).   Timothy Clark reports SI for the past 2 weeks due to multiple medical problems and psychosocial stressors including being unable to drive due to poor vision. He also reports that his dog died. He reports current SI. He denies HI or AVH. He denies seeing a psychiatrist in the past. He sees his PCP for Ativan which he takes for anxiety. He reports poor appetite and poor sleep.   Past Psychiatric History: Anxiety  Risk to Self: Is patient at risk for suicide?: Yes Risk to Others:  None. Denis HI.  Prior Inpatient Therapy:  Denies  Prior Outpatient Therapy:  He receives Ativan from his family doctor, Dr. Clemmie Krill.   Past Medical History:  Past Medical History:  Diagnosis Date  . Chronic abdominal pain   . Esophagitis   . Eye hemorrhage   .  Gastritis   . Gastroparesis   . GERD (gastroesophageal reflux disease)   . Glaucoma   . Helicobacter pylori (H. pylori) infection 2016   ERADICATION DOCUMENTED VIA STOOL TEST in AUG 2016 at [place]  . Hiccups   . Hypertension   .  Nausea and vomiting    chronic, recurrent  . Type 2 diabetes mellitus with complications (HCC)    diagnosed around age 61    Past Surgical History:  Procedure Laterality Date  . CATARACT EXTRACTION W/PHACO Left 05/19/2016   Procedure: CATARACT EXTRACTION PHACO AND INTRAOCULAR LENS PLACEMENT LEFT EYE;  Surgeon: Tonny Branch, MD;  Location: [facility];  Service: Ophthalmology;  Laterality: Left;  CDE: 7.30  . CATARACT EXTRACTION W/PHACO Right 06/23/2016   Procedure: CATARACT EXTRACTION PHACO AND INTRAOCULAR LENS PLACEMENT RIGHT EYE CDE=9.87;  Surgeon: Tonny Branch, MD;  Location: [facility];  Service: Ophthalmology;  Laterality: Right;  right  . ESOPHAGOGASTRODUODENOSCOPY  2015   Dr. Britta Mccreedy  . ESOPHAGOGASTRODUODENOSCOPY N/A 10/26/2014   RMR: Distal esophagititis-likely reflux related although an element of pill induced injuruy no exclueded  status post biopsy. Diffusely abnormal gastric mucosa of uncertain signigicane -status post gastric biopsy. Focal area of excoriation in the cardia most consistant with a trauma of heaving.   . ESOPHAGOGASTRODUODENOSCOPY  08/2015   [facility]: mild esophagitis and old gastric contents  . EYE SURGERY  2015   stent placed to left eye  . EYE SURGERY    . INCISION AND DRAINAGE OF WOUND Right 07/16/2017   Procedure: IRRIGATION AND DEBRIDEMENT OF SOFT TISSUE OF ULCERATION RIGHT FOOT;  Surgeon: Caprice Beaver, DPM;  Location: [facility];  Service: Podiatry;  Laterality: Right;   Family History:  Family History  Problem Relation Age of Onset  . Ovarian cancer Mother   . Heart attack Father   . Cervical cancer Sister   . Diabetes Sister   . Colon cancer Neg Hx    Family Psychiatric  History: Denies  Social History:  Social History   Substance and Sexual Activity  Alcohol Use No  . Alcohol/week: 0.0 oz     Social History   Substance and Sexual Activity  Drug Use No    Social History   Socioeconomic History  . Marital status: Single    Spouse name: None  .  Number of children: None  . Years of education: None  . Highest education level: None  Social Needs  . Financial resource strain: None  . Food insecurity - worry: None  . Food insecurity - inability: None  . Transportation needs - medical: None  . Transportation needs - non-medical: None  Occupational History  . None  Tobacco Use  . Smoking status: Never Smoker  . Smokeless tobacco: Never Used  Substance and Sexual Activity  . Alcohol use: No    Alcohol/week: 0.0 oz  . Drug use: No  . Sexual activity: Yes  Other Topics Concern  . None  Social History Narrative  . None   Additional Social History: He lives at home with his father and sister. He is unemployed and receives disability. He denies illicit substance, alcohol or tobacco use.      Allergies:   Allergies  Allergen Reactions  . Donnatal [Pb-Hyoscy-Atropine-Scopol Er] Anaphylaxis    Reaction to GI cocktail  . Fish Allergy Anaphylaxis, Hives and Rash  . Haldol [Haloperidol Lactate] Other (See Comments)    Chest Pain  . Lidocaine Anaphylaxis    Reaction to GI cocktail  . Maalox [Calcium Carbonate  Antacid] Anaphylaxis    Reaction to GI cocktail  . Aspirin Hives and Itching  . Bactrim [Sulfamethoxazole-Trimethoprim] Itching  . Keflex [Cephalexin] Hives, Itching and Nausea And Vomiting  . Penicillins Hives and Itching    Has patient had a PCN reaction causing immediate rash, facial/tongue/throat swelling, SOB or lightheadedness with hypotension: yes Has patient had a PCN reaction causing severe rash involving mucus membranes or skin necrosis: yes Has patient had a PCN reaction that required hospitalization no Has patient had a PCN reaction occurring within the last 10 years: yes If all of the above answers are "NO", then may proceed with Cephalospor  . Phenergan [Promethazine Hcl] Nausea And Vomiting    Labs:  Results for orders placed or performed during the hospital encounter of 09/20/17 (from the past 48  hour(s))  Comprehensive metabolic panel     Status: Abnormal   Collection Time: 09/20/17  9:53 PM  Result Value Ref Range   Sodium 133 (L) 135 - 145 mmol/L   Potassium 2.4 (LL) 3.5 - 5.1 mmol/L    Comment: CRITICAL RESULT CALLED TO, READ BACK BY AND VERIFIED WITH: B JESSEE,RN 09/20/17 2309 RHOLMES    Chloride 94 (L) 101 - 111 mmol/L   CO2 30 22 - 32 mmol/L   Glucose, Bld 198 (H) 65 - 99 mg/dL   BUN 17 6 - 20 mg/dL   Creatinine, Ser 2.47 (H) 0.61 - 1.24 mg/dL   Calcium 8.3 (L) 8.9 - 10.3 mg/dL   Total Protein 6.6 6.5 - 8.1 g/dL   Albumin 3.3 (L) 3.5 - 5.0 g/dL   AST 23 15 - 41 U/L   ALT 14 (L) 17 - 63 U/L   Alkaline Phosphatase 54 38 - 126 U/L   Total Bilirubin 0.7 0.3 - 1.2 mg/dL   GFR calc non Af Amer 29 (L) >60 mL/min   GFR calc Af Amer 34 (L) >60 mL/min    Comment: (NOTE) The eGFR has been calculated using the CKD EPI equation. This calculation has not been validated in all clinical situations. eGFR's persistently <60 mL/min signify possible Chronic Kidney Disease.    Anion gap 9 5 - 15    Comment: Performed at [facility], [place] [address], [place], [place] [address]  Ethanol     Status: None   Collection Time: 09/20/17  9:53 PM  Result Value Ref Range   Alcohol, Ethyl (B) <10 <10 mg/dL    Comment:        LOWEST DETECTABLE LIMIT FOR SERUM ALCOHOL IS 10 mg/dL FOR MEDICAL PURPOSES ONLY Performed at [facility] [address] [place], [place] [address]   Salicylate level     Status: None   Collection Time: 09/20/17  9:53 PM  Result Value Ref Range   Salicylate Lvl <7.8 2.8 - 30.0 mg/dL    Comment: Performed at [facility], [place] [address] [place], [place] [address]  Acetaminophen level     Status: Abnormal   Collection Time: 09/20/17  9:53 PM  Result Value Ref Range   Acetaminophen (Tylenol), Serum <10 (L) 10 - 30 ug/mL    Comment:        THERAPEUTIC CONCENTRATIONS VARY SIGNIFICANTLY. A RANGE OF  10-30 ug/mL MAY BE AN EFFECTIVE CONCENTRATION FOR MANY PATIENTS. HOWEVER, SOME ARE BEST TREATED AT CONCENTRATIONS OUTSIDE THIS RANGE. ACETAMINOPHEN CONCENTRATIONS >150 ug/mL AT 4 HOURS AFTER INGESTION AND >50 ug/mL AT 12 HOURS AFTER INGESTION ARE OFTEN ASSOCIATED WITH TOXIC REACTIONS. Performed at  [facility], [place] [address], [place], [place] [address]   cbc     Status: Abnormal   Collection Time: 09/20/17  9:53 PM  Result Value Ref Range   WBC 10.2 4.0 - 10.5 K/uL   RBC 3.17 (L) 4.22 - 5.81 MIL/uL   Hemoglobin 9.8 (L) 13.0 - 17.0 g/dL   HCT 27.4 (L) 39.0 - 52.0 %   MCV 86.4 78.0 - 100.0 fL   MCH 30.9 26.0 - 34.0 pg   MCHC 35.8 30.0 - 36.0 g/dL   RDW 13.2 11.5 - 15.5 %   Platelets 178 150 - 400 K/uL    Comment: Performed at [facility], [place] [address] [place], [place] [address]  Rapid urine drug screen (hospital performed)     Status: Abnormal   Collection Time: 09/20/17 10:46 PM  Result Value Ref Range   Opiates POSITIVE (A) NONE DETECTED   Cocaine NONE DETECTED NONE DETECTED   Benzodiazepines POSITIVE (A) NONE DETECTED   Amphetamines NONE DETECTED NONE DETECTED   Tetrahydrocannabinol NONE DETECTED NONE DETECTED   Barbiturates NONE DETECTED NONE DETECTED    Comment: (NOTE) DRUG SCREEN FOR MEDICAL PURPOSES ONLY.  IF CONFIRMATION IS NEEDED FOR ANY PURPOSE, NOTIFY LAB WITHIN 5 DAYS. LOWEST DETECTABLE LIMITS FOR URINE DRUG SCREEN Drug Class                     Cutoff (ng/mL) Amphetamine and metabolites    1000 Barbiturate and metabolites    200 Benzodiazepine                 017 Tricyclics and metabolites     300 Opiates and metabolites        300 Cocaine and metabolites        300 THC                            50 Performed at [facility], [place] [address], [place], [place] [address]   Magnesium     Status: None   Collection Time: 09/20/17 11:44 PM  Result Value Ref Range   Magnesium 1.9 1.7 - 2.4 mg/dL     Comment: Performed at [facility], [place] [address], [place], [place] [address]  CK     Status: None   Collection Time: 09/21/17  4:30 AM  Result Value Ref Range   Total CK 171 49 - 397 U/L    Comment: Performed at [facility], [place] [address] [place], [place] [address]  CBC with Differential/Platelet     Status: Abnormal   Collection Time: 09/21/17  4:30 AM  Result Value Ref Range   WBC 8.7 4.0 - 10.5 K/uL   RBC 3.24 (L) 4.22 - 5.81 MIL/uL   Hemoglobin 10.2 (L) 13.0 - 17.0 g/dL   HCT 28.1 (L) 39.0 - 52.0 %   MCV 86.7 78.0 - 100.0 fL   MCH 31.5 26.0 - 34.0 pg   MCHC 36.3 (H) 30.0 - 36.0 g/dL   RDW 13.5 11.5 - 15.5 %   Platelets 146 (L) 150 - 400 K/uL   Neutrophils Relative % 73 %   Neutro Abs 6.5 1.7 - 7.7 K/uL   Lymphocytes Relative 19 %   Lymphs Abs 1.6 0.7 - 4.0 K/uL   Monocytes Relative 7 %   Monocytes Absolute 0.6 0.1 - 1.0 K/uL   Eosinophils Relative 1 %   Eosinophils  Absolute 0.1 0.0 - 0.7 K/uL   Basophils Relative 0 %   Basophils Absolute 0.0 0.0 - 0.1 K/uL    Comment: Performed at [facility], [place] [address], [place], [place] [address]  Comprehensive metabolic panel     Status: Abnormal   Collection Time: 09/21/17  4:30 AM  Result Value Ref Range   Sodium 134 (L) 135 - 145 mmol/L   Potassium 2.6 (LL) 3.5 - 5.1 mmol/L    Comment: CRITICAL RESULT CALLED TO, READ BACK BY AND VERIFIED WITH: M.TAI,PA 09/21/17 _0  BY V.WILKINS    Chloride 98 (L) 101 - 111 mmol/L   CO2 25 22 - 32 mmol/L   Glucose, Bld 212 (H) 65 - 99 mg/dL   BUN 17 6 - 20 mg/dL   Creatinine, Ser 2.05 (H) 0.61 - 1.24 mg/dL   Calcium 7.9 (L) 8.9 - 10.3 mg/dL   Total Protein 6.2 (L) 6.5 - 8.1 g/dL   Albumin 3.0 (L) 3.5 - 5.0 g/dL   AST 19 15 - 41 U/L   ALT 12 (L) 17 - 63 U/L   Alkaline Phosphatase 48 38 - 126 U/L   Total Bilirubin 0.7 0.3 - 1.2 mg/dL   GFR calc non Af Amer 37 (L) >60 mL/min   GFR calc Af Amer 43 (L) >60 mL/min     Comment: (NOTE) The eGFR has been calculated using the CKD EPI equation. This calculation has not been validated in all clinical situations. eGFR's persistently <60 mL/min signify possible Chronic Kidney Disease.    Anion gap 11 5 - 15    Comment: Performed at [facility], [place] [address] [place], [place] [address]  Troponin I     Status: Abnormal   Collection Time: 09/21/17  4:30 AM  Result Value Ref Range   Troponin I 0.04 (HH) <0.03 ng/mL    Comment: CRITICAL RESULT CALLED TO, READ BACK BY AND VERIFIED WITH: M.TAI,PA 09/21/17 _1  BY V.WILKINS Performed at [facility], [place] [address] [place], [place] [address]     Current Facility-Administered Medications  Medication Dose Route Frequency Provider Last Rate Last Dose  . atorvastatin (LIPITOR) tablet 10 mg  10 mg Oral QPC supper Antonietta Breach, PA-C      . brimonidine (ALPHAGAN) 0.15 % ophthalmic solution 1 drop  1 drop Both Eyes BID Antonietta Breach, PA-C   1 drop at 09/21/17 1116  . cyclobenzaprine (FLEXERIL) tablet 10 mg  10 mg Oral QHS PRN Antonietta Breach, PA-C      . dicyclomine (BENTYL) capsule 10 mg  10 mg Oral TID AC & HS Antonietta Breach, PA-C   10 mg at 09/21/17 1113  . gabapentin (NEURONTIN) capsule 600 mg  600 mg Oral TID WC PRN Antonietta Breach, PA-C      . hydrALAZINE (APRESOLINE) injection 10 mg  10 mg Intravenous Q2H PRN Patrecia Pour, MD      . insulin aspart protamine- aspart (NOVOLOG MIX 70/30) injection 25 Units  25 Units Subcutaneous BID WC Patrecia Pour, MD      . loratadine (CLARITIN) tablet 10 mg  10 mg Oral Daily Antonietta Breach, PA-C   10 mg at 09/21/17 1113  . LORazepam (ATIVAN) tablet 0.5 mg  0.5 mg Oral QHS PRN Antonietta Breach, PA-C      . metoCLOPramide (REGLAN) tablet 10 mg  10 mg Oral Q8H PRN Antonietta Breach, PA-C      . metoprolol succinate (TOPROL-XL) 24 hr tablet 50 mg  50 mg Oral Daily Antonietta Breach, PA-C      . pantoprazole (PROTONIX) EC tablet 40 mg  40 mg Oral Daily Antonietta Breach,  PA-C   40 mg at 09/21/17 1113  . potassium chloride SA (K-DUR,KLOR-CON) CR tablet 40 mEq  40 mEq Oral BID Antonietta Breach, PA-C   40 mEq at 09/21/17 1111  . sucralfate (CARAFATE) tablet 1 g  1 g Oral QID Antonietta Breach, PA-C   1 g at 09/21/17 1113  . timolol (TIMOPTIC) 0.5 % ophthalmic solution 1 drop  1 drop Both Eyes BID Antonietta Breach, PA-C   1 drop at 09/21/17 1115   Current Outpatient Medications  Medication Sig Dispense Refill  . atorvastatin (LIPITOR) 10 MG tablet Take 10 mg by mouth every evening.     . brimonidine (ALPHAGAN) 0.15 % ophthalmic solution Place 1 drop into both eyes 2 (two) times daily.     . cetirizine (ZYRTEC) 10 MG tablet Take 10 mg by mouth daily.    . cyclobenzaprine (FLEXERIL) 10 MG tablet Take 10 mg by mouth at bedtime as needed for muscle spasms.     [name] dicyclomine (BENTYL) 10 MG capsule TAKE 1 CAPSULE BY MOUTH 4 TIMES DAILY BEFORE MEAL(S) AND AT BEDTIME 120 capsule 5  . gabapentin (NEURONTIN) 600 MG tablet Take 600 mg by mouth 3 (three) times daily with meals as needed (nerve pain).     [name] gentamicin cream (GARAMYCIN) 0.1 % Apply 1 application topically 3 (three) times daily. 30 g 1  . hydrochlorothiazide (HYDRODIURIL) 12.5 MG tablet Take 1 tablet (12.5 mg total) by mouth daily. 30 tablet 0  . insulin detemir (LEVEMIR) 100 UNIT/ML injection Inject 10 Units into the skin at bedtime.     . insulin NPH-regular Human (NOVOLIN 70/30) (70-30) 100 UNIT/ML injection Inject 35 Units into the skin 2 (two) times daily.    [name] lisinopril (PRINIVIL,ZESTRIL) 40 MG tablet Take 40 mg by mouth daily.    [name] LORazepam (ATIVAN) 0.5 MG tablet Take 0.5 mg by mouth at bedtime as needed for anxiety.    . metoprolol succinate (TOPROL-XL) 50 MG 24 hr tablet Take 50 mg by mouth daily. Take with or immediately following a meal.    . pantoprazole (PROTONIX) 40 MG tablet TAKE 1 TABLET BY MOUTH TWICE DAILY BEFORE  A  MEAL 60 tablet 5  . potassium chloride SA (K-DUR,KLOR-CON) 20 MEQ tablet Take 2 tablets (40  mEq total) 2 (two) times daily by mouth. 90 tablet 0  . sucralfate (CARAFATE) 1 g tablet TAKE ONE TABLET BY MOUTH 4 TIMES DAILY 120 tablet 0  . timolol (TIMOPTIC) 0.5 % ophthalmic solution Place 1 drop into both eyes 2 (two) times daily.    . ciprofloxacin (CIPRO) 500 MG tablet Take 1 tablet (500 mg total) by mouth 2 (two) times daily. (Patient not taking: Reported on 09/20/2017) 20 tablet 0  . metoCLOPramide (REGLAN) 10 MG tablet Take 1 tablet (10 mg total) by mouth 3 (three) times daily before meals. (Patient not taking: Reported on 09/20/2017) 90 tablet 3  . ondansetron (ZOFRAN) 4 MG tablet Take 1 tablet (4 mg total) every 6 (six) hours by mouth. (Patient not taking: Reported on 09/20/2017) 20 tablet 0    Musculoskeletal: Strength & Muscle Tone: UTA since patient was in 4 point restraints.  Gait & Station: UTA since patient was in 4 point restraints.  Patient leans: N/A  Psychiatric Specialty Exam: Physical Exam  Nursing note and vitals reviewed. Constitutional: He is oriented to  person, place, and time. He appears well-developed and well-nourished.  HENT:  Head: Normocephalic and atraumatic.  Neck: Normal range of motion.  Musculoskeletal: Normal range of motion.  Neurological: He is alert and oriented to person, place, and time.  Psychiatric: His speech is normal and behavior is normal. Thought content normal. Cognition and memory are normal. He expresses impulsivity. He exhibits a depressed mood.    Review of Systems  Constitutional: Negative for fever.  Cardiovascular: Positive for chest pain.  Gastrointestinal: Positive for abdominal pain. Negative for constipation, diarrhea, nausea and vomiting.  Psychiatric/Behavioral: Positive for depression and suicidal ideas. Negative for hallucinations and substance abuse. The patient is nervous/anxious and has insomnia.     Blood pressure (!) 220/101, pulse 87, temperature 99.5 F (37.5 C), temperature source Rectal, resp. rate (!) 22,  weight 134.7 kg (297 lb), SpO2 99 %.Body mass index is 40.28 kg/m.  General Appearance: Fairly Groomed, middle aged, overweight, African American male, wearing paper hospital scrubs, well kept beard and lying with his bed tilted down at the head of bed in 4 point restraints. NAD.   Eye Contact:  Poor due to falling asleep throughout interview.   Speech:  Clear and Coherent and Slow  Volume:  Decreased  Mood:  Depressed  Affect:  Congruent  Thought Process:  Goal Directed and Linear  Orientation:  Full (Time, Place, and Person)  Thought Content:  Logical  Suicidal Thoughts:  Yes.  with intent/plan  Homicidal Thoughts:  No  Memory:  Immediate;   Good Recent;   Good Remote;   Good  Judgement:  Fair but recently poor.   Insight:  Fair  Psychomotor Activity:  Decreased  Concentration:  Concentration: Fair and Attention Span: Fair  Recall:  [employer] of Knowledge:  Fair  Language:  Fair  Akathisia:  No  Handed:  Right  AIMS (if indicated):   N/A  Assets:  Agricultural consultant Housing Social Support  ADL's:  Intact  Cognition:  WNL  Sleep:   Poor   Assessment:  LAYTHAN HAYTER is a 47 y.o. male who presents to the hospital for medical clearance and was found to have acute encephalopathy in the setting of hypertensive urgency. He continues to warrant inpatient psychiatric hospitalization following medical clearance due to increased risk of harm to self.   Treatment Plan Summary: -Patient warrants inpatient psychiatric hospitalization given high risk of harm to self. -Continue Engineer, materials.  -Advise judicious use of antipsychotic medications due to prolonged QTc of 529. Continue restraints as needed for episodic periods of agitation due to altered mental status.  -Please pursue involuntary commitment if patient refuses voluntary psychiatric hospitalization or attempts to leave the hospital.  -Will sign off on patient at this time. Please consult  psychiatry again as needed.     Disposition: Recommend psychiatric Inpatient admission when medically cleared.  Faythe Dingwall, DO 09/21/2017 12:41 PM

## 2017-09-21 NOTE — Progress Notes (Signed)
CSW attempted to page provider who placed consult for this pt at [phone] but received no call back.  CSW enquiring about consult stating pt to go to [facility] once medically cleared.  Per chart pt has been violent and has required restraints, but is now no longer requiring restraints.  CSW will continue to follow for D/C needs.  Alphonse Guild. Lynx Goodrich, LCSW, LCAS, CSI Clinical Social Worker Ph: [phone]

## 2017-09-21 NOTE — ED Notes (Signed)
Patient released from violent restraints. Patient states that he understands that he will be put back in restraints if he begins to exhibit aggressive behaviors.

## 2017-09-21 NOTE — ED Notes (Signed)
In effort to keep patient safe and d/c hard restraints, patient placed in soft waist non-violent restraint.

## 2017-09-21 NOTE — ED Notes (Signed)
Hospitalist at the bedside 

## 2017-09-21 NOTE — Progress Notes (Signed)
Consult request has been received. CSW attempting to follow up at present time.  Shaquanda Graves F. Farrin Shadle, LCSW, LCAS, CSI Clinical Social Worker Ph: [phone]  

## 2017-09-21 NOTE — ED Notes (Addendum)
Patient attempting to get out of bed and behaving aggressively. Dr. Bonner Puna made aware. Pt threatening to get out of bed and stating "don't test me"

## 2017-09-21 NOTE — H&P (Signed)
History and Physical   Timothy Clark FOY:[id number] DOB: Apr 12, 1971 DOA: 09/20/2017  Referring MD/NP/PA: Antonietta Breach, PA, EDP PCP: Timothy Kaufman, PA-C  Patient coming from: [facility]  Chief Complaint: Medical clearance  HPI: Timothy Clark is a 47 y.o. male with a history of obesity, HTN, IDT2DM, depression, GERD, chronic pain, glaucoma who was sent from [facility] for medical clearance prior to admission for suicidal ideation. He reported the desire to walk into traffic in an effort to kill himself which he still reports. This is Clark, and he reports worsening depression which is constant. He had no medical complaints on arrival. Work up indicated AKI, hypokalemia, and he grew more agitated overnight. Hospitalists asked to admit. IVC papers taken out due to agitation, attempting to leave, geodon 20mg  IM given and ultimately put on low dose precedex with improvement. This was tapered off and the patient appears Clark for admission to the floor for ongoing IVF's and potassium supplementation.   Review of Systems: Denies fever, chills, weight loss, changes in vision or hearing, headache, cough, sore throat, chest pain, palpitations, shortness of breath, abdominal pain, nausea, vomiting, changes in bowel habits, blood in stool, change in bladder habits, myalgias, arthralgias, and rash. All others reviewed and are negative.   Past Medical History:  Diagnosis Date  . Chronic abdominal pain   . Esophagitis   . Eye hemorrhage   . Gastritis   . Gastroparesis   . GERD (gastroesophageal reflux disease)   . Glaucoma   . Helicobacter pylori (H. pylori) infection 2016   ERADICATION DOCUMENTED VIA STOOL TEST in AUG 2016 at Timothy Clark  . Hiccups   . Hypertension   . Nausea and vomiting    chronic, recurrent  . Type 2 diabetes mellitus with complications (HCC)    diagnosed around age 58   Past Surgical History:  Procedure Laterality Date  . CATARACT EXTRACTION W/PHACO Left 05/19/2016   Procedure:  CATARACT EXTRACTION PHACO AND INTRAOCULAR LENS PLACEMENT LEFT EYE;  Surgeon: Timothy Branch, MD;  Location: [facility];  Service: Ophthalmology;  Laterality: Left;  CDE: 7.30  . CATARACT EXTRACTION W/PHACO Right 06/23/2016   Procedure: CATARACT EXTRACTION PHACO AND INTRAOCULAR LENS PLACEMENT RIGHT EYE CDE=9.87;  Surgeon: Timothy Branch, MD;  Location: [facility];  Service: Ophthalmology;  Laterality: Right;  right  . ESOPHAGOGASTRODUODENOSCOPY  2015   Dr. Britta Clark  . ESOPHAGOGASTRODUODENOSCOPY N/A 10/26/2014   RMR: Distal esophagititis-likely reflux related although an element of pill induced injuruy no exclueded  status post biopsy. Diffusely abnormal gastric mucosa of uncertain signigicane -status post gastric biopsy. Focal area of excoriation in the cardia most consistant with a trauma of heaving.   . ESOPHAGOGASTRODUODENOSCOPY  08/2015   [facility]: mild esophagitis and old gastric contents  . EYE SURGERY  2015   stent placed to left eye  . EYE SURGERY    . INCISION AND DRAINAGE OF WOUND Right 07/16/2017   Procedure: IRRIGATION AND DEBRIDEMENT OF SOFT TISSUE OF ULCERATION RIGHT FOOT;  Surgeon: Timothy Clark, DPM;  Location: [facility];  Service: Podiatry;  Laterality: Right;   - Not smoking, denies recent EtOH or illicit drug use.  reports that  has never smoked. he has never used smokeless tobacco. He reports that he does not drink alcohol or use drugs. Allergies  Allergen Reactions  . Donnatal [Pb-Hyoscy-Atropine-Scopol Er] Anaphylaxis    Reaction to GI cocktail  . Fish Allergy Anaphylaxis, Hives and Rash  . Haldol [Haloperidol Lactate] Other (See Comments)    Chest Pain  .  Lidocaine Anaphylaxis    Reaction to GI cocktail  . Maalox [Calcium Carbonate Antacid] Anaphylaxis    Reaction to GI cocktail  . Aspirin Hives and Itching  . Bactrim [Sulfamethoxazole-Trimethoprim] Itching  . Keflex [Cephalexin] Hives, Itching and Nausea And Vomiting  . Penicillins Hives and Itching    Has patient had a PCN  reaction causing immediate rash, facial/tongue/throat swelling, SOB or lightheadedness with hypotension: yes Has patient had a PCN reaction causing severe rash involving mucus membranes or skin necrosis: yes Has patient had a PCN reaction that required hospitalization no Has patient had a PCN reaction occurring within the last 10 years: yes If all of the above answers are "NO", then may proceed with Cephalospor  . Phenergan [Promethazine Hcl] Nausea And Vomiting   Family History  Problem Relation Age of Onset  . Ovarian cancer Mother   . Heart attack Father   . Cervical cancer Sister   . Diabetes Sister   . Colon cancer Neg Hx    - Family history otherwise reviewed and not pertinent.  Prior to Admission medications   Medication Sig Start Date End Date Taking? Authorizing Provider  atorvastatin (LIPITOR) 10 MG tablet Take 10 mg by mouth every evening.    Yes [provider]  brimonidine (ALPHAGAN) 0.15 % ophthalmic solution Place 1 drop into both eyes 2 (two) times daily.    Yes [provider]  cetirizine (ZYRTEC) 10 MG tablet Take 10 mg by mouth daily.   Yes [provider]  cyclobenzaprine (FLEXERIL) 10 MG tablet Take 10 mg by mouth at bedtime as needed for muscle spasms.    Yes [provider]  dicyclomine (BENTYL) 10 MG capsule TAKE 1 CAPSULE BY MOUTH 4 TIMES DAILY BEFORE MEAL(S) AND AT BEDTIME 08/06/17  Yes Timothy Stable, NP  gabapentin (NEURONTIN) 600 MG tablet Take 600 mg by mouth 3 (three) times daily with meals as needed (nerve pain).    Yes [provider]  gentamicin cream (GARAMYCIN) 0.1 % Apply 1 application topically 3 (three) times daily. 09/02/17  Yes Timothy Clark, DPM  hydrochlorothiazide (HYDRODIURIL) 12.5 MG tablet Take 1 tablet (12.5 mg total) by mouth daily. 07/18/17 09/20/17 Yes Johnson, Clanford L, MD  insulin detemir (LEVEMIR) 100 UNIT/ML injection Inject 10 Units into the skin at bedtime.    Yes [provider]    insulin NPH-regular Human (NOVOLIN 70/30) (70-30) 100 UNIT/ML injection Inject 35 Units into the skin 2 (two) times daily.   Yes [provider]  lisinopril (PRINIVIL,ZESTRIL) 40 MG tablet Take 40 mg by mouth daily.   Yes [provider]  LORazepam (ATIVAN) 0.5 MG tablet Take 0.5 mg by mouth at bedtime as needed for anxiety.   Yes [provider]  metoprolol succinate (TOPROL-XL) 50 MG 24 hr tablet Take 50 mg by mouth daily. Take with or immediately following a meal.   Yes [provider]  pantoprazole (PROTONIX) 40 MG tablet TAKE 1 TABLET BY MOUTH TWICE DAILY BEFORE  A  MEAL 03/23/17  Yes Mahala Menghini, PA-C  potassium chloride SA (K-DUR,KLOR-CON) 20 MEQ tablet Take 2 tablets (40 mEq total) 2 (two) times daily by mouth. 06/16/17  Yes Eber Jones, MD  sucralfate (CARAFATE) 1 g tablet TAKE ONE TABLET BY MOUTH 4 TIMES DAILY 03/12/16  Yes Annitta Needs, NP  timolol (TIMOPTIC) 0.5 % ophthalmic solution Place 1 drop into both eyes 2 (two) times daily.   Yes [provider]  ciprofloxacin (CIPRO) 500  MG tablet Take 1 tablet (500 mg total) by mouth 2 (two) times daily. Patient not taking: Reported on 09/20/2017 08/26/17   Timothy Clark, DPM  metoCLOPramide (REGLAN) 10 MG tablet Take 1 tablet (10 mg total) by mouth 3 (three) times daily before meals. Patient not taking: Reported on 09/20/2017 08/14/17   Annitta Needs, NP  ondansetron (ZOFRAN) 4 MG tablet Take 1 tablet (4 mg total) every 6 (six) hours by mouth. Patient not taking: Reported on 09/20/2017 06/16/17   Eber Jones, MD    Physical Exam: Vitals:   09/21/17 0700 09/21/17 0730 09/21/17 0800 09/21/17 0822  BP: (!) 184/91 120/66 (!) 105/52 114/61  Pulse: 77 74 72 73  Resp: 16 18 (!) 21 (!) 22  Temp:      TempSrc:      SpO2: 96% 96% 98% 95%  Weight:       Constitutional: Obese, drowsy middle-aged male in no distress Eyes: Lids and conjunctivae normal, PERRL. Lateral strabismus  noted. ENMT: Mucous membranes are dry. Posterior pharynx clear of any exudate or lesions. Poor dentition.  Neck: normal, supple, no masses, no thyromegaly Respiratory: Non-labored breathing room air without accessory muscle use. Clear breath sounds to auscultation bilaterally Cardiovascular: Regular rate and rhythm, no murmurs, rubs, or gallops. No carotid bruits. No JVD. No LE edema. + pedal pulses. Abdomen: Normoactive bowel sounds. No tenderness, non-distended, and no masses palpated. No hepatosplenomegaly. GU: Mp indwelling catheter Musculoskeletal: Mp clubbing / cyanosis. No joint deformity upper and lower extremities. Good ROM, no contractures. Normal muscle tone.  Skin: Warm, dry. Venous stasis changes, otherwise no rashes, wounds or ulcers. Neurologic: Not cooperative with exam due to drowsiness. No focal deficits noted.  Psychiatric: Drowsy but rousable, remembers where he is, who he is and the day. Not sure why he's in the hospital, guessed "heart attack?" When reminded of sequence of events he reports remembering. +SI, denies HI. Not responding to internal stimuli.   Labs on Admission: I have personally reviewed following labs and imaging studies  CBC: Recent Labs  Lab 09/20/17 2153 09/21/17 0430  WBC 10.2 8.7  NEUTROABS  --  6.5  HGB 9.8* 10.2*  HCT 27.4* 28.1*  MCV 86.4 86.7  PLT 178 263*   Basic Metabolic Panel: Recent Labs  Lab 09/20/17 2153 09/20/17 2344 09/21/17 0430  NA 133*  --  134*  K 2.4*  --  2.6*  CL 94*  --  98*  CO2 30  --  25  GLUCOSE 198*  --  212*  BUN 17  --  17  CREATININE 2.47*  --  2.05*  CALCIUM 8.3*  --  7.9*  MG  --  1.9  --    GFR: Estimated Creatinine Clearance: 63.3 mL/min (A) (by C-G formula based on SCr of 2.05 mg/dL (H)). Liver Function Tests: Recent Labs  Lab 09/20/17 2153 09/21/17 0430  AST 23 19  ALT 14* 12*  ALKPHOS 54 48  BILITOT 0.7 0.7  PROT 6.6 6.2*  ALBUMIN 3.3* 3.0*   Cardiac Enzymes: Recent Labs  Lab  09/21/17 0430  CKTOTAL 171  TROPONINI 0.04*   Radiological Exams on Admission: Ct Head Wo Contrast  Result Date: 09/21/2017 CLINICAL DATA:  47 y/o M; [facility] for medical clearance, altered level of consciousness. EXAM: CT HEAD WITHOUT CONTRAST TECHNIQUE: Contiguous axial images were obtained from the base of the skull through the vertex without intravenous contrast. COMPARISON:  08/30/2017 CT head. FINDINGS: Brain: No evidence of acute  infarction, hemorrhage, hydrocephalus, extra-axial collection or mass lesion/mass effect. Vascular: Calcific atherosclerosis of carotid siphons. No hyperdense vessel identified. Skull: Small focus of scalp scarring in the right parietal region is Clark. No calvarial fracture. Sinuses/Orbits: No acute finding. Other: Bilateral intra-ocular lens replacement and left glaucoma device. IMPRESSION: Clark negative CT of the head. Electronically Signed   By: Kristine Garbe M.D.   On: 09/21/2017 05:38   EKG: Independently reviewed. NSR, QT interval prolonged, very diminutive T waves, possible U waves noted. No ischemic ST segment deviations.  Assessment/Plan Principal Problem:   Acute kidney injury ([place]) Active Problems:   Diabetes mellitus type 2 with complications, uncontrolled ([place])   Hyperlipidemia   Depression   Essential hypertension   Gastroparesis due to DM ([place])   Insulin dependent diabetes mellitus ([place])   Hypokalemia   GERD (gastroesophageal reflux disease)   Chronic abdominal pain   Suicidal ideation   Hypertensive urgency   Prolonged QT interval   Suicidal ideation: Will require [facility] hospitalization once medically cleared. Pt initially voluntary, but currently under IVC. - Landscape architect, suicide precautions  Acute encephalopathy: Worsening in the ED, received geodon 20mg , transiently requiring precedex infusion. He has been Clark off of this for >1 hour. UDS +benzo, opiates; neg EtOH, APAP, salicylate. Head CT neg.   - Monitor closely. Continue on CIWA, ativan prn  AKI: Prerenal most likely. Current CrCl ~45. Baseline Cr appears to be 1.2, which indicates CrCl > 80.  - Continue to monitor with IVF's.  - Hold lisinopril  Hypokalemia:  - Modestly improved with replacement, plan to continue replacement and recheck. Mg 1.9.  - Hold HCTZ  Hypertension with hypertensive urgency: Metoprolol, labetalol, and IV hydralazine given in ED in addition to sedating medication has brought BP down significantly. Nadir has been MAP in 70's. With range of elevation, plan to target SBP <167mmHg. - Restart metoprolol. Holding HCTZ/ACE as below.  - Hydralazine IV prn - Troponin elevation thought to be due to demand, will check again this PM. No chest pain or ischemic ECG.  T2DM: HbA1c 7.5% - Start SSI qAC/HS, hold levemir 10u qHS and decrease 70/30 insulin 35u > 25u BID pending improvement in mental status. - Continue statin for hyperlipidemia - Continue reglan for gastroparesis  Chronic abdominal pain:  - Continue home medications including gabapentin, bentyl  Prolonged QT interval: May be overestimated due to diminutive T waves, though pt is on provocative medications and severely hypokalemic.  - Monitor on telemetry.   Glaucoma:  - Continue home gtt's  GERD:  - Continue PPI, carafate  DVT prophylaxis: Lovenox  Code Status: Full  Family Communication: None Disposition Plan: [facility] once improved Consults called: None  Admission status: Observation    Vance Gather, MD [facility] [email or web address] Password [email or web address] 09/21/2017, 10:06 AM

## 2017-09-21 NOTE — ED Notes (Addendum)
Patient meets criteria to discontinue violent restraints.

## 2017-09-21 NOTE — ED Notes (Signed)
Patient verbally and physically aggressive and trying to get out of bed. Placed back in hard restraints with order from hospitalist Dr. Bonner Puna.

## 2017-09-21 NOTE — ED Notes (Signed)
Pt's restraints changed to gurney cuffs.

## 2017-09-21 NOTE — ED Notes (Signed)
Patient is violently rocking back and forth in the stretcher attempting to get out of the hard restraints.

## 2017-09-21 NOTE — ED Notes (Signed)
Date and time results received: 09/21/17 1547  (use smartphrase ".now" to insert current time)  Test: Troponin Critical Value: 0.04

## 2017-09-21 NOTE — ED Provider Notes (Addendum)
Patient awaiting pending hospitalist evaluation for admission.   I was contacted by Hospitalist service prior to there evaluation of the patient.  Hospitalist service is concerned that patient is on a Precedex drip and not in line for a unit bed.  Patient is currently asleep and sedated.  Hospitalist service asked that we turn off the Precedex drip and reevaluate. If patient's behavior does not escalate, patient does not require Precedex drip and can be admitted to non-ICU bed.   Update at 75 - Precedex is off for last 15 minutes. Patient remains asleep. Patient seen by Dr. Bonner Puna, who will likely admit patient.   Valarie Merino, MD 09/21/17 [id number]     Valarie Merino, MD 09/21/17 [phone]

## 2017-09-22 DIAGNOSIS — E1143 Type 2 diabetes mellitus with diabetic autonomic (poly)neuropathy: Secondary | ICD-10-CM | POA: Diagnosis present

## 2017-09-22 DIAGNOSIS — Z8049 Family history of malignant neoplasm of other genital organs: Secondary | ICD-10-CM | POA: Diagnosis not present

## 2017-09-22 DIAGNOSIS — Z833 Family history of diabetes mellitus: Secondary | ICD-10-CM | POA: Diagnosis not present

## 2017-09-22 DIAGNOSIS — Z794 Long term (current) use of insulin: Secondary | ICD-10-CM | POA: Diagnosis not present

## 2017-09-22 DIAGNOSIS — I10 Essential (primary) hypertension: Secondary | ICD-10-CM | POA: Diagnosis not present

## 2017-09-22 DIAGNOSIS — Z9842 Cataract extraction status, left eye: Secondary | ICD-10-CM | POA: Diagnosis not present

## 2017-09-22 DIAGNOSIS — F329 Major depressive disorder, single episode, unspecified: Secondary | ICD-10-CM | POA: Diagnosis not present

## 2017-09-22 DIAGNOSIS — Z8249 Family history of ischemic heart disease and other diseases of the circulatory system: Secondary | ICD-10-CM | POA: Diagnosis not present

## 2017-09-22 DIAGNOSIS — K3184 Gastroparesis: Secondary | ICD-10-CM | POA: Diagnosis present

## 2017-09-22 DIAGNOSIS — R45851 Suicidal ideations: Secondary | ICD-10-CM | POA: Diagnosis present

## 2017-09-22 DIAGNOSIS — H409 Unspecified glaucoma: Secondary | ICD-10-CM | POA: Diagnosis present

## 2017-09-22 DIAGNOSIS — R109 Unspecified abdominal pain: Secondary | ICD-10-CM | POA: Diagnosis not present

## 2017-09-22 DIAGNOSIS — K210 Gastro-esophageal reflux disease with esophagitis: Secondary | ICD-10-CM | POA: Diagnosis present

## 2017-09-22 DIAGNOSIS — Z8041 Family history of malignant neoplasm of ovary: Secondary | ICD-10-CM | POA: Diagnosis not present

## 2017-09-22 DIAGNOSIS — E876 Hypokalemia: Secondary | ICD-10-CM | POA: Diagnosis present

## 2017-09-22 DIAGNOSIS — I129 Hypertensive chronic kidney disease with stage 1 through stage 4 chronic kidney disease, or unspecified chronic kidney disease: Secondary | ICD-10-CM | POA: Diagnosis present

## 2017-09-22 DIAGNOSIS — I160 Hypertensive urgency: Secondary | ICD-10-CM | POA: Diagnosis present

## 2017-09-22 DIAGNOSIS — G8929 Other chronic pain: Secondary | ICD-10-CM | POA: Diagnosis present

## 2017-09-22 DIAGNOSIS — G92 Toxic encephalopathy: Secondary | ICD-10-CM | POA: Diagnosis present

## 2017-09-22 DIAGNOSIS — N183 Chronic kidney disease, stage 3 (moderate): Secondary | ICD-10-CM | POA: Diagnosis present

## 2017-09-22 DIAGNOSIS — F419 Anxiety disorder, unspecified: Secondary | ICD-10-CM | POA: Diagnosis present

## 2017-09-22 DIAGNOSIS — N179 Acute kidney failure, unspecified: Secondary | ICD-10-CM | POA: Diagnosis present

## 2017-09-22 DIAGNOSIS — E118 Type 2 diabetes mellitus with unspecified complications: Secondary | ICD-10-CM | POA: Diagnosis not present

## 2017-09-22 DIAGNOSIS — E1165 Type 2 diabetes mellitus with hyperglycemia: Secondary | ICD-10-CM | POA: Diagnosis not present

## 2017-09-22 DIAGNOSIS — Z961 Presence of intraocular lens: Secondary | ICD-10-CM | POA: Diagnosis present

## 2017-09-22 DIAGNOSIS — E785 Hyperlipidemia, unspecified: Secondary | ICD-10-CM | POA: Diagnosis present

## 2017-09-22 DIAGNOSIS — Z9841 Cataract extraction status, right eye: Secondary | ICD-10-CM | POA: Diagnosis not present

## 2017-09-22 DIAGNOSIS — Z6841 Body Mass Index (BMI) 40.0 and over, adult: Secondary | ICD-10-CM | POA: Diagnosis not present

## 2017-09-22 DIAGNOSIS — Z781 Physical restraint status: Secondary | ICD-10-CM | POA: Diagnosis not present

## 2017-09-22 LAB — GLUCOSE, CAPILLARY
Glucose-Capillary: 101 mg/dL — ABNORMAL HIGH (ref 65–99)
Glucose-Capillary: 125 mg/dL — ABNORMAL HIGH (ref 65–99)
Glucose-Capillary: 100 mg/dL — ABNORMAL HIGH (ref 65–99)
Glucose-Capillary: 113 mg/dL — ABNORMAL HIGH (ref 65–99)

## 2017-09-22 LAB — BASIC METABOLIC PANEL
Anion gap: 12 (ref 5–15)
BUN: 13 mg/dL (ref 6–20)
CO2: 26 mmol/L (ref 22–32)
Calcium: 8.2 mg/dL — ABNORMAL LOW (ref 8.9–10.3)
Chloride: 102 mmol/L (ref 101–111)
Creatinine, Ser: 1.53 mg/dL — ABNORMAL HIGH (ref 0.61–1.24)
GFR calc Af Amer: >60 mL/min (ref >60)
GFR calc non Af Amer: 53 mL/min — ABNORMAL LOW (ref >60)
Glucose, Bld: 107 mg/dL — ABNORMAL HIGH (ref 65–99)
Potassium: 2.5 mmol/L — CL (ref 3.5–5.1)
Sodium: 140 mmol/L (ref 135–145)

## 2017-09-22 LAB — MAGNESIUM: Magnesium: 1.8 mg/dL (ref 1.7–2.4)

## 2017-09-22 LAB — POTASSIUM: Potassium: 2.4 mmol/L — CL (ref 3.5–5.1)

## 2017-09-22 MED ORDER — POTASSIUM CHLORIDE CRYS ER 20 MEQ PO TBCR
40.0000 meq | EXTENDED_RELEASE_TABLET | Freq: Once | ORAL | Status: AC
  Administered 2017-09-22: 40 meq via ORAL
  Filled 2017-09-22: qty 2

## 2017-09-22 MED ORDER — CLONIDINE HCL 0.1 MG PO TABS
0.1000 mg | ORAL_TABLET | Freq: Two times a day (BID) | ORAL | Status: DC
  Administered 2017-09-22 – 2017-09-26 (×9): 0.1 mg via ORAL
  Filled 2017-09-22 (×9): qty 1

## 2017-09-22 MED ORDER — HYDRALAZINE HCL 25 MG PO TABS
25.0000 mg | ORAL_TABLET | Freq: Three times a day (TID) | ORAL | Status: DC
  Administered 2017-09-22 – 2017-09-23 (×3): 25 mg via ORAL
  Filled 2017-09-22 (×3): qty 1

## 2017-09-22 MED ORDER — PREMIER PROTEIN SHAKE
11.0000 [oz_av] | Freq: Two times a day (BID) | ORAL | Status: DC
  Administered 2017-09-23 – 2017-09-25 (×6): 11 [oz_av] via ORAL
  Filled 2017-09-22 (×9): qty 325.31

## 2017-09-22 MED ORDER — POTASSIUM CHLORIDE 10 MEQ/100ML IV SOLN
10.0000 meq | INTRAVENOUS | Status: AC
  Administered 2017-09-22 – 2017-09-23 (×6): 10 meq via INTRAVENOUS
  Filled 2017-09-22 (×6): qty 100

## 2017-09-22 NOTE — Progress Notes (Signed)
CRITICAL VALUE ALERT  Critical Value: K+  2.4   Date & Time Notied:  021219 @ 2020  Provider Notified: Shea Evans text sent Timothy Blinks, NP   Orders Received/Actions taken: pending

## 2017-09-22 NOTE — Clinical Social Work Note (Signed)
Clinical Social Work Assessment  Patient Details  Name: Timothy Clark MRN: [id number] Date of Birth: April 28, 1971  Date of referral:  09/22/17               Reason for consult:  Facility Placement                Permission sought to share information with:  Case Manager, Customer service manager, Family Supports Permission granted to share information::  Yes, Verbal Permission Granted  Name::     Sister  Agency::     Relationship::     Contact Information:     Housing/Transportation Living arrangements for the past 2 months:  Single Family Home Source of Information:  Patient Patient Interpreter Needed:  None Criminal Activity/Legal Involvement Pertinent to Current Situation/Hospitalization:  No - Comment as needed Significant Relationships:  Siblings, Parents Lives with:  Siblings, Parents Do you feel safe going back to the place where you live?  Yes Need for family participation in patient care:  Yes (Comment)  Care giving concerns:  No care giving concerns at the time of assessment.    Social Worker assessment / plan:  LCSW following for inpatient psych placement.  LCSW met with patient at bedside. No family present at the time of assessment. Patient has sitter and is under IVC due to agitation in the ED. Patient is in restraints.  Patient reports that he lives with his dad and sister. Patient reports he went to [facility] because he has been feeling suicidal for the past 2 weeks. Patient reports that this is the first time he has every felt this way.   Patient reports that he is on meds for anxiety. Current meds are prescribed by his PCP. Patient states that he is not followed in the community by a therapist or psychiatrist.   Patient is restraints for aggression and agitation. Patient was eating lunch and became anxious and started to shake involuntarily and try to get up out of bed. All movements appeared involuntary. Patient reports that when he eats he gets cold sweats and  starts to shake.   Patient is willing to go inpatient when medically stable.   PLAN: Patient will go inpatient will medically stable.   Employment status:  Disabled (Comment on whether or not currently receiving Disability) Insurance information:  Managed Medicare PT Recommendations:  Not assessed at this time Information / Referral to community resources:  Inpatient Psychiatric Care (Comment Required)  Patient/Family's Response to care:  Not assessed.   Patient/Family's Understanding of and Emotional Response to Diagnosis, Current Treatment, and Prognosis:  Patient is understanding of diagnosis and agreeable to treatment plan.   Emotional Assessment Appearance:  Appears stated age Attitude/Demeanor/Rapport:    Affect (typically observed):  Pleasant, Calm Orientation:  Oriented to Self, Oriented to Place, Oriented to  Time, Oriented to Situation Alcohol / Substance use:  Alcohol Use, Illicit Drugs Psych involvement (Current and /or in the community):  No (Comment)  Discharge Needs  Concerns to be addressed:  No discharge needs identified Readmission within the last 30 days:  No Current discharge risk:  None Barriers to Discharge:  Continued Medical Work up, No SNF bed   Servando Snare, LCSW 09/22/2017, 1:45 PM

## 2017-09-22 NOTE — ED Notes (Signed)
ED TO INPATIENT HANDOFF REPORT  Name/Age/Gender Timothy Clark 47 y.o. male  Code Status    Code Status Orders  (From admission, onward)        Start     Ordered   09/21/17 1522  Full code  Continuous     09/21/17 1521    Code Status History    Date Active Date Inactive Code Status Order ID Comments User Context   09/20/2017 22:27 09/21/2017 15:21 Full Code [id number]  Beverely Pace ED   07/15/2017 01:43 07/18/2017 20:00 Full Code [id number]  Reubin Milan, MD Inpatient   06/10/2017 19:36 06/16/2017 22:58 Full Code [id number]  Eber Jones, MD Inpatient   01/31/2017 17:45 02/02/2017 14:30 Full Code [id number]  Truett Mainland, DO Inpatient   09/13/2016 19:55 09/15/2016 17:36 Full Code [id number]  Truett Mainland, DO Inpatient   09/05/2016 20:26 09/07/2016 17:20 Full Code [id number]  Phillips Grout, MD Inpatient   02/17/2015 23:36 02/20/2015 15:35 Full Code [id number]  Jani Gravel, MD Inpatient   01/15/2015 04:50 01/16/2015 16:27 Full Code [id number]  Phillips Grout, MD Inpatient   10/25/2014 13:30 10/29/2014 15:52 Full Code [id number]  Doree Albee, MD ED      Home/SNF/Other Behavioral Health  Chief Complaint Medical Clearance   Level of Care/Admitting Diagnosis ED Disposition    ED Disposition Condition Comment   [facility]: [facility] [[id number]]  Level of Care: Telemetry [5]  Admit to tele based on following criteria: Monitor QTC interval  Diagnosis: Acute kidney injury [facility]) [[id number]]  Admitting Physician: Patrecia Pour [phone]  Attending Physician: Patrecia Pour [phone]  PT Class (Do Not Modify): Observation [104]  PT Acc Code (Do Not Modify): Observation [10022]       Medical History Past Medical History:  Diagnosis Date  . Chronic abdominal pain   . Esophagitis   . Eye hemorrhage   . Gastritis   . Gastroparesis   . GERD (gastroesophageal reflux disease)   . Glaucoma   . Helicobacter pylori (H. pylori) infection 2016   ERADICATION DOCUMENTED VIA STOOL TEST in AUG 2016 at [place]  . Hiccups   . Hypertension   . Nausea and vomiting    chronic, recurrent  . Type 2 diabetes mellitus with complications (HCC)    diagnosed around age 62    Allergies Allergies  Allergen Reactions  . Donnatal [Pb-Hyoscy-Atropine-Scopol Er] Anaphylaxis    Reaction to GI cocktail  . Fish Allergy Anaphylaxis, Hives and Rash  . Haldol [Haloperidol Lactate] Other (See Comments)    Chest Pain  . Lidocaine Anaphylaxis    Reaction to GI cocktail  . Maalox [Calcium Carbonate Antacid] Anaphylaxis    Reaction to GI cocktail  . Aspirin Hives and Itching  . Bactrim [Sulfamethoxazole-Trimethoprim] Itching  . Keflex [Cephalexin] Hives, Itching and Nausea And Vomiting  . Penicillins Hives and Itching    Has patient had a PCN reaction causing immediate rash, facial/tongue/throat swelling, SOB or lightheadedness with hypotension: yes Has patient had a PCN reaction causing severe rash involving mucus membranes or skin necrosis: yes Has patient had a PCN reaction that required hospitalization no Has patient had a PCN reaction occurring within the last 10 years: yes If all of the above answers are "NO", then may proceed with Cephalospor  . Phenergan [Promethazine Hcl] Nausea And Vomiting    IV Location/Drains/Wounds Patient Lines/Drains/Airways Status   Active Line/Drains/Airways    Name:   Placement date:   Placement  time:   Site:   Days:   Peripheral IV 09/21/17 Right;Upper Arm   09/21/17    0008    Arm   1   Incision (Closed) 06/10/17 Foot Right   06/10/17    1738     104   Incision (Closed) 07/16/17 Foot Right   07/16/17    1124     68   Pressure Injury 01/31/17 Stage II -  Partial thickness loss of dermis presenting as a shallow open ulcer with a red, pink wound bed without slough. 1.5 CM round through skin layers with red base. Has home care for treatment wet to dry dressing   01/31/17    1830     234   Wound / Incision (Open or  Dehisced) 07/15/17 Diabetic ulcer Foot Right;Lateral   07/15/17    0130    Foot   69          Labs/Imaging Results for orders placed or performed during the hospital encounter of 09/20/17 (from the past 48 hour(s))  Comprehensive metabolic panel     Status: Abnormal   Collection Time: 09/20/17  9:53 PM  Result Value Ref Range   Sodium 133 (L) 135 - 145 mmol/L   Potassium 2.4 (LL) 3.5 - 5.1 mmol/L    Comment: CRITICAL RESULT CALLED TO, READ BACK BY AND VERIFIED WITH: B JESSEE,RN 09/20/17 2309 RHOLMES    Chloride 94 (L) 101 - 111 mmol/L   CO2 30 22 - 32 mmol/L   Glucose, Bld 198 (H) 65 - 99 mg/dL   BUN 17 6 - 20 mg/dL   Creatinine, Ser 2.47 (H) 0.61 - 1.24 mg/dL   Calcium 8.3 (L) 8.9 - 10.3 mg/dL   Total Protein 6.6 6.5 - 8.1 g/dL   Albumin 3.3 (L) 3.5 - 5.0 g/dL   AST 23 15 - 41 U/L   ALT 14 (L) 17 - 63 U/L   Alkaline Phosphatase 54 38 - 126 U/L   Total Bilirubin 0.7 0.3 - 1.2 mg/dL   GFR calc non Af Amer 29 (L) >60 mL/min   GFR calc Af Amer 34 (L) >60 mL/min    Comment: (NOTE) The eGFR has been calculated using the CKD EPI equation. This calculation has not been validated in all clinical situations. eGFR's persistently <60 mL/min signify possible Chronic Kidney Disease.    Anion gap 9 5 - 15    Comment: Performed at [facility], [place] [address] [place], [place] [address]  Ethanol     Status: None   Collection Time: 09/20/17  9:53 PM  Result Value Ref Range   Alcohol, Ethyl (B) <10 <10 mg/dL    Comment:        LOWEST DETECTABLE LIMIT FOR SERUM ALCOHOL IS 10 mg/dL FOR MEDICAL PURPOSES ONLY Performed at [place] [address], [place], [place] [address]   Salicylate level     Status: None   Collection Time: 09/20/17  9:53 PM  Result Value Ref Range   Salicylate Lvl <3.7 2.8 - 30.0 mg/dL    Comment: Performed at [facility], [place] [address] [place], [place] [address]  Acetaminophen level     Status:  Abnormal   Collection Time: 09/20/17  9:53 PM  Result Value Ref Range   Acetaminophen (Tylenol), Serum <10 (L) 10 - 30 ug/mL    Comment:        THERAPEUTIC CONCENTRATIONS VARY SIGNIFICANTLY. A RANGE OF 10-30 ug/mL MAY BE AN  EFFECTIVE CONCENTRATION FOR MANY PATIENTS. HOWEVER, SOME ARE BEST TREATED AT CONCENTRATIONS OUTSIDE THIS RANGE. ACETAMINOPHEN CONCENTRATIONS >150 ug/mL AT 4 HOURS AFTER INGESTION AND >50 ug/mL AT 12 HOURS AFTER INGESTION ARE OFTEN ASSOCIATED WITH TOXIC REACTIONS. Performed at [facility], [place] [address], [place], [place] [address]   cbc     Status: Abnormal   Collection Time: 09/20/17  9:53 PM  Result Value Ref Range   WBC 10.2 4.0 - 10.5 K/uL   RBC 3.17 (L) 4.22 - 5.81 MIL/uL   Hemoglobin 9.8 (L) 13.0 - 17.0 g/dL   HCT 27.4 (L) 39.0 - 52.0 %   MCV 86.4 78.0 - 100.0 fL   MCH 30.9 26.0 - 34.0 pg   MCHC 35.8 30.0 - 36.0 g/dL   RDW 13.2 11.5 - 15.5 %   Platelets 178 150 - 400 K/uL    Comment: Performed at [facility], [place] [address], [place], [place] [address]  Rapid urine drug screen (hospital performed)     Status: Abnormal   Collection Time: 09/20/17 10:46 PM  Result Value Ref Range   Opiates POSITIVE (A) NONE DETECTED   Cocaine NONE DETECTED NONE DETECTED   Benzodiazepines POSITIVE (A) NONE DETECTED   Amphetamines NONE DETECTED NONE DETECTED   Tetrahydrocannabinol NONE DETECTED NONE DETECTED   Barbiturates NONE DETECTED NONE DETECTED    Comment: (NOTE) DRUG SCREEN FOR MEDICAL PURPOSES ONLY.  IF CONFIRMATION IS NEEDED FOR ANY PURPOSE, NOTIFY LAB WITHIN 5 DAYS. LOWEST DETECTABLE LIMITS FOR URINE DRUG SCREEN Drug Class                     Cutoff (ng/mL) Amphetamine and metabolites    1000 Barbiturate and metabolites    200 Benzodiazepine                 622 Tricyclics and metabolites     300 Opiates and metabolites        300 Cocaine and metabolites        300 THC                            50 Performed  at [facility], [place] [address], [place], [place] [address]   Magnesium     Status: None   Collection Time: 09/20/17 11:44 PM  Result Value Ref Range   Magnesium 1.9 1.7 - 2.4 mg/dL    Comment: Performed at [facility], [place] [address] [place], [place] [address]  CK     Status: None   Collection Time: 09/21/17  4:30 AM  Result Value Ref Range   Total CK 171 49 - 397 U/L    Comment: Performed at [facility], [place] [address] [place], [place] [address]  CBC with Differential/Platelet     Status: Abnormal   Collection Time: 09/21/17  4:30 AM  Result Value Ref Range   WBC 8.7 4.0 - 10.5 K/uL   RBC 3.24 (L) 4.22 - 5.81 MIL/uL   Hemoglobin 10.2 (L) 13.0 - 17.0 g/dL   HCT 28.1 (L) 39.0 - 52.0 %   MCV 86.7 78.0 - 100.0 fL   MCH 31.5 26.0 - 34.0 pg   MCHC 36.3 (H) 30.0 - 36.0 g/dL   RDW 13.5 11.5 - 15.5 %   Platelets 146 (L) 150 - 400 K/uL   Neutrophils Relative % 73 %   Neutro Abs 6.5 1.7 - 7.7  K/uL   Lymphocytes Relative 19 %   Lymphs Abs 1.6 0.7 - 4.0 K/uL   Monocytes Relative 7 %   Monocytes Absolute 0.6 0.1 - 1.0 K/uL   Eosinophils Relative 1 %   Eosinophils Absolute 0.1 0.0 - 0.7 K/uL   Basophils Relative 0 %   Basophils Absolute 0.0 0.0 - 0.1 K/uL    Comment: Performed at [facility], [place] [address], [place], [place] [address]  Comprehensive metabolic panel     Status: Abnormal   Collection Time: 09/21/17  4:30 AM  Result Value Ref Range   Sodium 134 (L) 135 - 145 mmol/L   Potassium 2.6 (LL) 3.5 - 5.1 mmol/L    Comment: CRITICAL RESULT CALLED TO, READ BACK BY AND VERIFIED WITH: M.TAI,PA 09/21/17 '@0519'$  BY V.WILKINS    Chloride 98 (L) 101 - 111 mmol/L   CO2 25 22 - 32 mmol/L   Glucose, Bld 212 (H) 65 - 99 mg/dL   BUN 17 6 - 20 mg/dL   Creatinine, Ser 2.05 (H) 0.61 - 1.24 mg/dL   Calcium 7.9 (L) 8.9 - 10.3 mg/dL   Total Protein 6.2 (L) 6.5 - 8.1 g/dL   Albumin 3.0 (L) 3.5 - 5.0 g/dL   AST 19  15 - 41 U/L   ALT 12 (L) 17 - 63 U/L   Alkaline Phosphatase 48 38 - 126 U/L   Total Bilirubin 0.7 0.3 - 1.2 mg/dL   GFR calc non Af Amer 37 (L) >60 mL/min   GFR calc Af Amer 43 (L) >60 mL/min    Comment: (NOTE) The eGFR has been calculated using the CKD EPI equation. This calculation has not been validated in all clinical situations. eGFR's persistently <60 mL/min signify possible Chronic Kidney Disease.    Anion gap 11 5 - 15    Comment: Performed at [facility], [place] [address], [place], [place] [address]  Troponin I     Status: Abnormal   Collection Time: 09/21/17  4:30 AM  Result Value Ref Range   Troponin I 0.04 (HH) <0.03 ng/mL    Comment: CRITICAL RESULT CALLED TO, READ BACK BY AND VERIFIED WITH: M.TAI,PA 09/21/17 '@0519'$  BY V.WILKINS Performed at [facility], [place] [address], [place], [place] [address]   Troponin I (q 6hr x 3)     Status: Abnormal   Collection Time: 09/21/17  3:22 PM  Result Value Ref Range   Troponin I 0.04 (HH) <0.03 ng/mL    Comment: CRITICAL RESULT CALLED TO, READ BACK BY AND VERIFIED WITH: L.VAUGHN AT 1648 ON 09/21/17 BY N.THOMPSON Performed at [facility], [place] [address] [place], [place] [address]   Basic metabolic panel     Status: Abnormal   Collection Time: 09/21/17  3:22 PM  Result Value Ref Range   Sodium 139 135 - 145 mmol/L   Potassium 3.0 (L) 3.5 - 5.1 mmol/L   Chloride 103 101 - 111 mmol/L   CO2 25 22 - 32 mmol/L   Glucose, Bld 207 (H) 65 - 99 mg/dL   BUN 16 6 - 20 mg/dL   Creatinine, Ser 2.00 (H) 0.61 - 1.24 mg/dL   Calcium 8.1 (L) 8.9 - 10.3 mg/dL   GFR calc non Af Amer 38 (L) >60 mL/min   GFR calc Af Amer 44 (L) >60 mL/min    Comment: (NOTE) The eGFR has been calculated using the CKD EPI equation. This calculation has not been validated in all clinical situations.  eGFR's persistently <60 mL/min signify possible Chronic Kidney Disease.    Anion gap 11 5 - 15    Comment:  Performed at [facility], [place] [address] [place], [place] [address]  CBG monitoring, ED     Status: Abnormal   Collection Time: 09/21/17  3:24 PM  Result Value Ref Range   Glucose-Capillary 205 (H) 65 - 99 mg/dL   Comment 1 Notify RN   CBG monitoring, ED     Status: Abnormal   Collection Time: 09/21/17  5:47 PM  Result Value Ref Range   Glucose-Capillary 138 (H) 65 - 99 mg/dL  CBG monitoring, ED     Status: Abnormal   Collection Time: 09/21/17 11:30 PM  Result Value Ref Range   Glucose-Capillary 119 (H) 65 - 99 mg/dL   Ct Head Wo Contrast  Result Date: 09/21/2017 CLINICAL DATA:  47 y/o M; [facility] for medical clearance, altered level of consciousness. EXAM: CT HEAD WITHOUT CONTRAST TECHNIQUE: Contiguous axial images were obtained from the base of the skull through the vertex without intravenous contrast. COMPARISON:  08/30/2017 CT head. FINDINGS: Brain: No evidence of acute infarction, hemorrhage, hydrocephalus, extra-axial collection or mass lesion/mass effect. Vascular: Calcific atherosclerosis of carotid siphons. No hyperdense vessel identified. Skull: Small focus of scalp scarring in the right parietal region is stable. No calvarial fracture. Sinuses/Orbits: No acute finding. Other: Bilateral intra-ocular lens replacement and left glaucoma device. IMPRESSION: Stable negative CT of the head. Electronically Signed   By: Kristine Garbe M.D.   On: 09/21/2017 05:38    Pending Labs Unresulted Labs (From admission, onward)   Start     Ordered   09/28/17 0500  Creatinine, serum  (enoxaparin (LOVENOX)    CrCl >/= 30 ml/min)  Weekly,   R    Comments:  while on enoxaparin therapy    09/21/17 1521   09/22/17 7672  Basic metabolic panel  Tomorrow morning,   R     09/21/17 1521   09/22/17 0500  Magnesium  Tomorrow morning,   R     09/21/17 1747      Vitals/Pain Today's Vitals   09/21/17 2230 09/21/17 2300 09/22/17 0030 09/22/17 0109  BP:  139/73 137/75 (!) 159/73 (!) 159/73  Pulse: 76 66 74 74  Resp:  20    Temp:      TempSrc:      SpO2: 97% 96% 96%   Weight:      PainSc:        Isolation Precautions No active isolations  Medications Medications  atorvastatin (LIPITOR) tablet 10 mg (10 mg Oral Given 09/21/17 1853)  brimonidine (ALPHAGAN) 0.15 % ophthalmic solution 1 drop (1 drop Both Eyes Given 09/21/17 1116)  loratadine (CLARITIN) tablet 10 mg (10 mg Oral Given 09/21/17 1113)  cyclobenzaprine (FLEXERIL) tablet 10 mg (not administered)  dicyclomine (BENTYL) capsule 10 mg (10 mg Oral Given 09/21/17 1854)  gabapentin (NEURONTIN) capsule 600 mg (not administered)  metoCLOPramide (REGLAN) tablet 10 mg (not administered)  metoprolol succinate (TOPROL-XL) 24 hr tablet 50 mg (50 mg Oral Not Given 09/21/17 1852)  pantoprazole (PROTONIX) EC tablet 40 mg (40 mg Oral Given 09/21/17 1113)  sucralfate (CARAFATE) tablet 1 g (1 g Oral Not Given 09/21/17 1855)  timolol (TIMOPTIC) 0.5 % ophthalmic solution 1 drop (1 drop Both Eyes Given 09/21/17 1115)  LORazepam (ATIVAN) tablet 1 mg (not administered)    Or  LORazepam (ATIVAN) injection 1 mg (not administered)  thiamine (VITAMIN B-1) tablet 100 mg (  not administered)    Or  thiamine (B-1) injection 100 mg (not administered)  folic acid (FOLVITE) tablet 1 mg (not administered)  enoxaparin (LOVENOX) injection 40 mg (40 mg Subcutaneous Given 09/22/17 0111)  sodium chloride flush (NS) 0.9 % injection 3 mL (not administered)  acetaminophen (TYLENOL) tablet 650 mg (not administered)    Or  acetaminophen (TYLENOL) suppository 650 mg (not administered)  insulin aspart (novoLOG) injection 0-5 Units (not administered)  LORazepam (ATIVAN) tablet 0-4 mg (not administered)    Followed by  LORazepam (ATIVAN) tablet 0-4 mg (not administered)  insulin aspart (novoLOG) injection 0-9 Units (not administered)  hydrALAZINE (APRESOLINE) injection 10 mg (10 mg Intravenous Given 09/21/17 1448)  insulin  aspart protamine- aspart (NOVOLOG MIX 70/30) injection 25 Units (25 Units Subcutaneous Given 09/21/17 1526)  potassium chloride SA (K-DUR,KLOR-CON) CR tablet 40 mEq (not administered)  sodium chloride 0.9 % bolus 1,000 mL (0 mLs Intravenous Stopped 09/21/17 0123)  potassium chloride 10 mEq in 100 mL IVPB (0 mEq Intravenous Stopped 09/21/17 0232)  ziprasidone (GEODON) injection 20 mg (20 mg Intramuscular Given 09/20/17 2346)  metoprolol tartrate (LOPRESSOR) injection 5 mg (5 mg Intravenous Given 09/21/17 0259)  hydrALAZINE (APRESOLINE) injection 10 mg (10 mg Intravenous Given 09/21/17 0419)  sodium chloride 0.9 % bolus 1,000 mL (0 mLs Intravenous Stopped 09/21/17 0533)  LORazepam (ATIVAN) injection 2 mg (2 mg Intravenous Given 09/21/17 0411)  diphenhydrAMINE (BENADRYL) injection 50 mg (50 mg Intravenous Given 09/21/17 0411)  potassium chloride 10 mEq in 100 mL IVPB (0 mEq Intravenous Stopped 09/21/17 1040)  labetalol (NORMODYNE,TRANDATE) injection 10 mg (10 mg Intravenous Given 09/21/17 0609)  ziprasidone (GEODON) injection 10 mg (10 mg Intramuscular Given 09/21/17 1205)  LORazepam (ATIVAN) injection 2 mg (2 mg Intravenous Given 09/21/17 1215)  LORazepam (ATIVAN) injection 2 mg (2 mg Intravenous Given 09/21/17 1540)    Mobility walks with person assist

## 2017-09-22 NOTE — Progress Notes (Signed)
Initial Nutrition Assessment  DOCUMENTATION CODES:   Morbid obesity  INTERVENTION:   Provide Premier Protein BID, each supplement provides 160kcal and 30g protein.   NUTRITION DIAGNOSIS:   Inadequate oral intake related to decreased appetite as evidenced by per patient/family report.  GOAL:   Patient will meet greater than or equal to 90% of their needs  MONITOR:   PO intake, Supplement acceptance, Weight trends, Labs  REASON FOR ASSESSMENT:   Malnutrition Screening Tool    ASSESSMENT:   Pt with PMH significant for HTN, DM, depression, and glaucoma. Presents this admission with suicidal ideation and AKI.    Pt lethargic upon assessment, falling in and out of sleep. Reports having loss in appetite for two week prior to admission related to depression symptoms. Unable to answer how many meals he eats per day. Pt had breakfast at bedside with scrambled eggs, toast, and sausage. Nurse tech at bedside reports he finished 10%. Pt reports a UBW of 300 lb. Denies any recent wt loss. Records indicate pt has maintained his weight of 295-300 lb since Sept 2018. Nutrition-Focused physical exam completed.   Medications reviewed and include: folic acid, SSI, carafate Labs reviewed: K 2.5 (L) Creatinine 1.53 (H)  NUTRITION - FOCUSED PHYSICAL EXAM:    Most Recent Value  Orbital Region  No depletion  Upper Arm Region  No depletion  Thoracic and Lumbar Region  Unable to assess  Buccal Region  No depletion  Temple Region  No depletion  Clavicle Bone Region  No depletion  Clavicle and Acromion Bone Region  No depletion  Scapular Bone Region  Unable to assess  Dorsal Hand  No depletion  Patellar Region  No depletion  Anterior Thigh Region  No depletion  Posterior Calf Region  No depletion  Edema (RD Assessment)  None  Hair  Unable to assess  Eyes  Unable to assess  Mouth  Unable to assess  Skin  Unable to assess  Nails  Unable to assess     Diet Order:  Suicide precautions Diet  Carb Modified Fluid consistency: Thin; Room service appropriate? Yes  EDUCATION NEEDS:   Not appropriate for education at this time  Skin:  Skin Assessment: Skin Integrity Issues: Skin Integrity Issues:: Incisions Incisions: right foot  Last BM:  PTA  Height:   Ht Readings from Last 1 Encounters:  08/14/17 6' (1.829 m)  201  Weight:   Wt Readings from Last 1 Encounters:  09/21/17 297 lb (134.7 kg)    Ideal Body Weight:  80.9 kg  BMI:  Body mass index is 40.28 kg/m.  Estimated Nutritional Needs:   Kcal:  2000-2200 kcal/day  Protein:  100-110 g/day  Fluid:  >2 L/day    Mariana Single RD, LDN Clinical Nutrition Pager # [phone]

## 2017-09-22 NOTE — Progress Notes (Signed)
PROGRESS NOTE  Timothy VERNO DHR:[id number] DOB: February 10, 1971 DOA: 09/20/2017 PCP: Rosalee Kaufman, PA-C   LOS: 0 days   Brief Narrative / Interim history: 48 y.o. male with a history of obesity, HTN, IDT2DM, depression, GERD, chronic pain, glaucoma who was sent from [facility] for medical clearance prior to admission for suicidal ideation.  He reported a desire to walk into traffic to kill himself.  In the ED he was found to be dehydrated with acute kidney injury, hypokalemia, and we were asked to admit.  Assessment & Plan: Principal Problem:   Depression Active Problems:   Diabetes mellitus type 2 with complications, uncontrolled ([place])   Hyperlipidemia   Essential hypertension   Gastroparesis due to DM (HCC)   Insulin dependent diabetes mellitus (HCC)   Hypokalemia   GERD (gastroesophageal reflux disease)   Acute kidney injury ([place])   Chronic abdominal pain   Suicidal ideation   Hypertensive urgency   Prolonged QT interval   Suicidal ideation -Will require [facility] hospitalization once medically cleared, currently under IVC, continue sitter, continue suicide precautions  Acute kidney injury on chronic kidney disease stage III -Likely prerenal, creatinine improved with IV fluids -Baseline creatinine 1.2-1.6, currently at baseline  Hypertension with hypertensive urgency -Lisinopril is on hold due to acute kidney injury and chronic kidney disease -Continue metoprolol, added hydralazine, still hypertensive and added clonidine today -Continue IV PRN's  Hypokalemia -Unclear p.o. intake at home, he is on HCTZ -Replete, recheck tomorrow morning, hold HCTZ  Chronic abdominal pain -This is been worked up in the past, continue home medications  Type 2 diabetes mellitus -Continue 7030, continue sliding scale, CBG is controlled this morning 100-120s  Right toe infection -December 2018, now status post partial first ray amputation on August 20, 2017 -Follow-up with podiatry as an  outpatient   DVT prophylaxis: Lovenox Code Status: Full code Family Communication: No family at bedside Disposition Plan: Home when ready  Consultants:   None  Procedures:   None   Antimicrobials:  None    Subjective: - no chest pain, shortness of breath, no abdominal pain, nausea or vomiting.   Objective: Vitals:   09/22/17 0349 09/22/17 0619 09/22/17 0900 09/22/17 1207  BP: (!) 179/68 (!) 197/91 (!) 203/100 (!) 195/97  Pulse:  78 86 83  Resp:    18  Temp:  98.1 F (36.7 C)  98.6 F (37 C)  TempSrc:  Oral  Oral  SpO2: 99% 99%  98%  Weight:        Intake/Output Summary (Last 24 hours) at 09/22/2017 1314 Last data filed at 09/22/2017 0900 Gross per 24 hour  Intake 360 ml  Output 2500 ml  Net -2140 ml   Filed Weights   09/21/17 0620  Weight: 134.7 kg (297 lb)    Examination:  Constitutional: NAD Eyes: lids and conjunctivae normal ENMT: Mucous membranes are moist. Neck: normal, supple Respiratory: clear to auscultation bilaterally, no wheezing, no crackles.  Cardiovascular: Regular rate and rhythm, no murmurs / rubs / gallops. Abdomen: no tenderness. Bowel sounds positive.  Musculoskeletal: no clubbing / cyanosis. Neurologic: non focal  Psychiatric: Normal judgment and insight.   Data Reviewed: I have independently reviewed following labs and imaging studies   CBC: Recent Labs  Lab 09/20/17 2153 09/21/17 0430  WBC 10.2 8.7  NEUTROABS  --  6.5  HGB 9.8* 10.2*  HCT 27.4* 28.1*  MCV 86.4 86.7  PLT 178 468*   Basic Metabolic Panel: Recent Labs  Lab 09/20/17 2153 09/20/17  2344 09/21/17 0430 09/21/17 1522 09/22/17 0605  NA 133*  --  134* 139 140  K 2.4*  --  2.6* 3.0* 2.5*  CL 94*  --  98* 103 102  CO2 30  --  25 25 26   GLUCOSE 198*  --  212* 207* 107*  BUN 17  --  17 16 13   CREATININE 2.47*  --  2.05* 2.00* 1.53*  CALCIUM 8.3*  --  7.9* 8.1* 8.2*  MG  --  1.9  --   --  1.8   GFR: Estimated Creatinine Clearance: 84.8 mL/min (A) (by  C-G formula based on SCr of 1.53 mg/dL (H)). Liver Function Tests: Recent Labs  Lab 09/20/17 2153 09/21/17 0430  AST 23 19  ALT 14* 12*  ALKPHOS 54 48  BILITOT 0.7 0.7  PROT 6.6 6.2*  ALBUMIN 3.3* 3.0*   No results for input(s): LIPASE, AMYLASE in the last 168 hours. No results for input(s): AMMONIA in the last 168 hours. Coagulation Profile: No results for input(s): INR, PROTIME in the last 168 hours. Cardiac Enzymes: Recent Labs  Lab 09/21/17 0430 09/21/17 1522  CKTOTAL 171  --   TROPONINI 0.04* 0.04*   BNP (last 3 results) No results for input(s): PROBNP in the last 8760 hours. HbA1C: No results for input(s): HGBA1C in the last 72 hours. CBG: Recent Labs  Lab 09/21/17 1524 09/21/17 1747 09/21/17 2330 09/22/17 0816 09/22/17 1205  GLUCAP 205* 138* 119* 101* 125*   Lipid Profile: No results for input(s): CHOL, HDL, LDLCALC, TRIG, CHOLHDL, LDLDIRECT in the last 72 hours. Thyroid Function Tests: No results for input(s): TSH, T4TOTAL, FREET4, T3FREE, THYROIDAB in the last 72 hours. Anemia Panel: No results for input(s): VITAMINB12, FOLATE, FERRITIN, TIBC, IRON, RETICCTPCT in the last 72 hours. Urine analysis:    Component Value Date/Time   COLORURINE YELLOW 07/14/2017 2200   APPEARANCEUR CLEAR 07/14/2017 2200   LABSPEC 1.013 07/14/2017 2200   PHURINE 7.0 07/14/2017 2200   GLUCOSEU >=500 (A) 07/14/2017 2200   HGBUR SMALL (A) 07/14/2017 2200   BILIRUBINUR NEGATIVE 07/14/2017 2200   KETONESUR NEGATIVE 07/14/2017 2200   PROTEINUR >=300 (A) 07/14/2017 2200   UROBILINOGEN 0.2 06/07/2015 1353   NITRITE NEGATIVE 07/14/2017 2200   LEUKOCYTESUR NEGATIVE 07/14/2017 2200   Sepsis Labs: Invalid input(s): PROCALCITONIN, LACTICIDVEN  No results found for this or any previous visit (from the past 240 hour(s)).    Radiology Studies: Ct Head Wo Contrast  Result Date: 09/21/2017 CLINICAL DATA:  47 y/o M; [facility] for medical clearance, altered level of  consciousness. EXAM: CT HEAD WITHOUT CONTRAST TECHNIQUE: Contiguous axial images were obtained from the base of the skull through the vertex without intravenous contrast. COMPARISON:  08/30/2017 CT head. FINDINGS: Brain: No evidence of acute infarction, hemorrhage, hydrocephalus, extra-axial collection or mass lesion/mass effect. Vascular: Calcific atherosclerosis of carotid siphons. No hyperdense vessel identified. Skull: Small focus of scalp scarring in the right parietal region is stable. No calvarial fracture. Sinuses/Orbits: No acute finding. Other: Bilateral intra-ocular lens replacement and left glaucoma device. IMPRESSION: Stable negative CT of the head. Electronically Signed   By: Kristine Garbe M.D.   On: 09/21/2017 05:38     Scheduled Meds: . atorvastatin  10 mg Oral QPC supper  . brimonidine  1 drop Both Eyes BID  . cloNIDine  0.1 mg Oral BID  . dicyclomine  10 mg Oral TID AC & HS  . enoxaparin (LOVENOX) injection  40 mg Subcutaneous Q24H  . folic acid  1 mg Oral Daily  . hydrALAZINE  25 mg Oral Q8H  . insulin aspart  0-5 Units Subcutaneous QHS  . insulin aspart  0-9 Units Subcutaneous TID WC  . insulin aspart protamine- aspart  25 Units Subcutaneous BID WC  . loratadine  10 mg Oral Daily  . LORazepam  0-4 mg Oral Q6H   Followed by  . [START ON 09/23/2017] LORazepam  0-4 mg Oral Q12H  . metoprolol succinate  50 mg Oral Daily  . pantoprazole  40 mg Oral Daily  . protein supplement shake  11 oz Oral BID BM  . sodium chloride flush  3 mL Intravenous Q12H  . sucralfate  1 g Oral QID  . thiamine  100 mg Oral Daily   Or  . thiamine  100 mg Intravenous Daily  . timolol  1 drop Both Eyes BID   Continuous Infusions:  Marzetta Board, MD, PhD [facility] Pager [phone]  If 7PM-7AM, please contact night-coverage [email or web address] Password [email or web address] 09/22/2017, 1:14 PM

## 2017-09-22 NOTE — Progress Notes (Signed)
CRITICAL VALUE ALERT  Critical Value:  K= 2.5  Date & Time Notied:  0720  Provider Notified: via AMION text  Orders Received/Actions taken: pending

## 2017-09-22 NOTE — Care Management Note (Signed)
Case Management Note  Patient Details  Name: Timothy Clark MRN: [id number] Date of Birth: [birth year]  Subjective/Objective:                  Medical clearance  Action/Plan: Date: September 22, 2017 Velva Harman, BSN, [place], [place] Chart and notes review for patient progress and needs. Will follow for case management and discharge needs. No cm or discharge needs present at time of this review. Next review date: [id number]  Expected Discharge Date:  (unknown)               Expected Discharge Plan:  Home/Self Care  In-House Referral:  Clinical Social Work  Discharge planning Services  CM Consult, Medication Assistance  Post Acute Care Choice:    Choice offered to:     DME Arranged:    DME Agency:     HH Arranged:    [place] Agency:     Status of Service:  In process, will continue to follow  If discussed at Long Length of Stay Meetings, dates discussed:    Additional Comments:  Leeroy Cha, RN 09/22/2017, 8:58 AM

## 2017-09-23 DIAGNOSIS — F329 Major depressive disorder, single episode, unspecified: Secondary | ICD-10-CM

## 2017-09-23 LAB — GLUCOSE, CAPILLARY
Glucose-Capillary: 89 mg/dL (ref 65–99)
Glucose-Capillary: 138 mg/dL — ABNORMAL HIGH (ref 65–99)
Glucose-Capillary: 144 mg/dL — ABNORMAL HIGH (ref 65–99)
Glucose-Capillary: 132 mg/dL — ABNORMAL HIGH (ref 65–99)

## 2017-09-23 LAB — CBC
HCT: 31 % — ABNORMAL LOW (ref 39.0–52.0)
Hemoglobin: 11.1 g/dL — ABNORMAL LOW (ref 13.0–17.0)
MCH: 31.3 pg (ref 26.0–34.0)
MCHC: 35.8 g/dL (ref 30.0–36.0)
MCV: 87.3 fL (ref 78.0–100.0)
Platelets: 179 10*3/uL (ref 150–400)
RBC: 3.55 MIL/uL — ABNORMAL LOW (ref 4.22–5.81)
RDW: 13.8 % (ref 11.5–15.5)
WBC: 9.5 10*3/uL (ref 4.0–10.5)

## 2017-09-23 LAB — BASIC METABOLIC PANEL
Anion gap: 12 (ref 5–15)
BUN: 14 mg/dL (ref 6–20)
CO2: 21 mmol/L — ABNORMAL LOW (ref 22–32)
Calcium: 7.9 mg/dL — ABNORMAL LOW (ref 8.9–10.3)
Chloride: 97 mmol/L — ABNORMAL LOW (ref 101–111)
Creatinine, Ser: 1.66 mg/dL — ABNORMAL HIGH (ref 0.61–1.24)
GFR calc Af Amer: 55 mL/min — ABNORMAL LOW (ref >60)
GFR calc non Af Amer: 48 mL/min — ABNORMAL LOW (ref >60)
Glucose, Bld: 143 mg/dL — ABNORMAL HIGH (ref 65–99)
Potassium: 3.2 mmol/L — ABNORMAL LOW (ref 3.5–5.1)
Sodium: 130 mmol/L — ABNORMAL LOW (ref 135–145)

## 2017-09-23 MED ORDER — HYDRALAZINE HCL 50 MG PO TABS
50.0000 mg | ORAL_TABLET | Freq: Four times a day (QID) | ORAL | Status: DC
  Administered 2017-09-23 – 2017-09-24 (×4): 50 mg via ORAL
  Filled 2017-09-23 (×4): qty 1

## 2017-09-23 MED ORDER — POTASSIUM CHLORIDE CRYS ER 20 MEQ PO TBCR
40.0000 meq | EXTENDED_RELEASE_TABLET | Freq: Two times a day (BID) | ORAL | Status: DC
  Administered 2017-09-23 – 2017-09-26 (×7): 40 meq via ORAL
  Filled 2017-09-23 (×7): qty 2

## 2017-09-23 NOTE — Progress Notes (Addendum)
PROGRESS NOTE    Timothy Clark  DUK:[id number] DOB: Aug 28, 1970 DOA: 09/20/2017 PCP: Rosalee Kaufman, PA-C   Brief Narrative: 47 y.o.malewitha history of obesity, HTN, IDT2DM, depression, GERD, chronic pain, glaucoma who was sent from [facility] for medical clearance prior to admission for suicidal ideation.  He reported a desire to walk into traffic to kill himself.  In the ED he was found to be dehydrated with acute kidney injury, hypokalemia, and we were asked to admit.     Assessment & Plan:   Principal Problem:   Depression Active Problems:   Diabetes mellitus type 2 with complications, uncontrolled ([place])   Hyperlipidemia   Essential hypertension   Gastroparesis due to DM (HCC)   AKI (acute kidney injury) ([place])   Insulin dependent diabetes mellitus (HCC)   Hypokalemia   GERD (gastroesophageal reflux disease)   Acute kidney injury ([place])   Chronic abdominal pain   Suicidal ideation   Hypertensive urgency   Prolonged QT interval Suicidal ideation -Will require BH H hospitalization once medically cleared, currently under IVC, continue sitter, continue suicide precautions  Acute kidney injury on chronic kidney disease stage III -Likely prerenal, creatinine improved with IV fluids -Baseline creatinine 1.2-1.6, currently at baseline  Hypertension with hypertensive urgency -Lisinopril is on hold due to acute kidney injury and chronic kidney disease -Continue metoprolol, added hydralazine, still hypertensive and added clonidine today -Continue IV PRN's -Blood pressure still elevated at 203/100. will increase his blood pressure medications further.  Hypokalemia -Unclear p.o. intake at home, he is on HCTZ -Replete, recheck tomorrow morning, hold HCTZ  Chronic abdominal pain -This is been worked up in the past, continue home medications  Type 2 diabetes mellitus -Continue 7030, continue sliding scale, CBG is controlled this morning 100-120s  Right toe  infection -December 2018, now status post partial first ray amputation on August 20, 2017 -Follow-up with podiatry as an outpatient       DVT prophylaxis:lovenox Code Status:full code Family Communication: none Disposition Plan: to [place] tomorrow.  Consultants: NONE  Procedures:NONE Antimicrobials: NONE Subjective: No specific complaints denies any shortness of breath nausea vomiting or diarrhea.  There is about when he can be discharged  to behavioral health.  Objective: Vitals:   09/22/17 1700 09/22/17 2344 09/23/17 0523 09/23/17 0617  BP: 131/74 128/72 132/88 136/82  Pulse: 81 72 63 65  Resp: 18 16 12 14   Temp: 98 F (36.7 C) 98.3 F (36.8 C) 97.7 F (36.5 C)   TempSrc: Oral Oral Oral   SpO2: 100% 99% 98% 98%  Weight:        Intake/Output Summary (Last 24 hours) at 09/23/2017 0938 Last data filed at 09/23/2017 0924 Gross per 24 hour  Intake 2179.43 ml  Output 1050 ml  Net 1129.43 ml   Filed Weights   09/21/17 0620  Weight: 134.7 kg (297 lb)    Examination:  General exam: Appears calm and comfortable  Respiratory system: Clear to auscultation. Respiratory effort normal. Cardiovascular system: S1 & S2 heard, RRR. No JVD, murmurs, rubs, gallops or clicks. No pedal edema. Gastrointestinal system: Abdomen is nondistended, soft and nontender. No organomegaly or masses felt. Normal bowel sounds heard. Central nervous system: Alert and oriented. No focal neurological deficits. Extremities: Symmetric 5 x 5 power. Skin: No rashes, lesions or ulcers Psychiatry: Judgement and insight appear normal. Mood & affect appropriate.     Data Reviewed: I have personally reviewed following labs and imaging studies  CBC: Recent Labs  Lab 09/20/17 2153  09/21/17 0430 09/23/17 0901  WBC 10.2 8.7 9.5  NEUTROABS  --  6.5  --   HGB 9.8* 10.2* 11.1*  HCT 27.4* 28.1* 31.0*  MCV 86.4 86.7 87.3  PLT 178 146* 329   Basic Metabolic Panel: Recent Labs   Lab 09/20/17 2153 09/20/17 2344 09/21/17 0430 09/21/17 1522 09/22/17 0605 09/22/17 1843  NA 133*  --  134* 139 140  --   K 2.4*  --  2.6* 3.0* 2.5* 2.4*  CL 94*  --  98* 103 102  --   CO2 30  --  25 25 26   --   GLUCOSE 198*  --  212* 207* 107*  --   BUN 17  --  17 16 13   --   CREATININE 2.47*  --  2.05* 2.00* 1.53*  --   CALCIUM 8.3*  --  7.9* 8.1* 8.2*  --   MG  --  1.9  --   --  1.8  --    GFR: Estimated Creatinine Clearance: 84.8 mL/min (A) (by C-G formula based on SCr of 1.53 mg/dL (H)). Liver Function Tests: Recent Labs  Lab 09/20/17 2153 09/21/17 0430  AST 23 19  ALT 14* 12*  ALKPHOS 54 48  BILITOT 0.7 0.7  PROT 6.6 6.2*  ALBUMIN 3.3* 3.0*   No results for input(s): LIPASE, AMYLASE in the last 168 hours. No results for input(s): AMMONIA in the last 168 hours. Coagulation Profile: No results for input(s): INR, PROTIME in the last 168 hours. Cardiac Enzymes: Recent Labs  Lab 09/21/17 0430 09/21/17 1522  CKTOTAL 171  --   TROPONINI 0.04* 0.04*   BNP (last 3 results) No results for input(s): PROBNP in the last 8760 hours. HbA1C: No results for input(s): HGBA1C in the last 72 hours. CBG: Recent Labs  Lab 09/22/17 0816 09/22/17 1205 09/22/17 1604 09/22/17 2135 09/23/17 0819  GLUCAP 101* 125* 100* 113* 89   Lipid Profile: No results for input(s): CHOL, HDL, LDLCALC, TRIG, CHOLHDL, LDLDIRECT in the last 72 hours. Thyroid Function Tests: No results for input(s): TSH, T4TOTAL, FREET4, T3FREE, THYROIDAB in the last 72 hours. Anemia Panel: No results for input(s): VITAMINB12, FOLATE, FERRITIN, TIBC, IRON, RETICCTPCT in the last 72 hours. Sepsis Labs: No results for input(s): PROCALCITON, LATICACIDVEN in the last 168 hours.  No results found for this or any previous visit (from the past 240 hour(s)).       Radiology Studies: No results found.      Scheduled Meds: . atorvastatin  10 mg Oral QPC supper  . brimonidine  1 drop Both Eyes BID  .  cloNIDine  0.1 mg Oral BID  . dicyclomine  10 mg Oral TID AC & HS  . enoxaparin (LOVENOX) injection  40 mg Subcutaneous Q24H  . folic acid  1 mg Oral Daily  . hydrALAZINE  25 mg Oral Q8H  . insulin aspart  0-5 Units Subcutaneous QHS  . insulin aspart  0-9 Units Subcutaneous TID WC  . insulin aspart protamine- aspart  25 Units Subcutaneous BID WC  . loratadine  10 mg Oral Daily  . LORazepam  0-4 mg Oral Q6H   Followed by  . LORazepam  0-4 mg Oral Q12H  . metoprolol succinate  50 mg Oral Daily  . pantoprazole  40 mg Oral Daily  . protein supplement shake  11 oz Oral BID BM  . sodium chloride flush  3 mL Intravenous Q12H  . sucralfate  1 g Oral QID  . thiamine  100 mg Oral Daily   Or  . thiamine  100 mg Intravenous Daily  . timolol  1 drop Both Eyes BID   Continuous Infusions:   LOS: 1 day      Georgette Shell, MD [facility] If 7PM-7AM, please contact night-coverage [email or web address] Password [facility] 09/23/2017, 9:38 AM

## 2017-09-24 LAB — BASIC METABOLIC PANEL
Anion gap: 10 (ref 5–15)
BUN: 20 mg/dL (ref 6–20)
CO2: 23 mmol/L (ref 22–32)
Calcium: 7.9 mg/dL — ABNORMAL LOW (ref 8.9–10.3)
Chloride: 97 mmol/L — ABNORMAL LOW (ref 101–111)
Creatinine, Ser: 2.09 mg/dL — ABNORMAL HIGH (ref 0.61–1.24)
GFR calc Af Amer: 42 mL/min — ABNORMAL LOW (ref >60)
GFR calc non Af Amer: 36 mL/min — ABNORMAL LOW (ref >60)
Glucose, Bld: 89 mg/dL (ref 65–99)
Potassium: 3.3 mmol/L — ABNORMAL LOW (ref 3.5–5.1)
Sodium: 130 mmol/L — ABNORMAL LOW (ref 135–145)

## 2017-09-24 LAB — GLUCOSE, CAPILLARY
Glucose-Capillary: 90 mg/dL (ref 65–99)
Glucose-Capillary: 113 mg/dL — ABNORMAL HIGH (ref 65–99)
Glucose-Capillary: 142 mg/dL — ABNORMAL HIGH (ref 65–99)
Glucose-Capillary: 157 mg/dL — ABNORMAL HIGH (ref 65–99)

## 2017-09-24 MED ORDER — FOLIC ACID 1 MG PO TABS: 1.0000 mg | ORAL_TABLET | Freq: Every day | ORAL | Status: DC

## 2017-09-24 MED ORDER — INSULIN ASPART PROT & ASPART (70-30 MIX) 100 UNIT/ML ~~LOC~~ SUSP: 25.0000 [IU] | Freq: Two times a day (BID) | SUBCUTANEOUS | 11 refills | Status: DC

## 2017-09-24 MED ORDER — HYDRALAZINE HCL 50 MG PO TABS
50.0000 mg | ORAL_TABLET | Freq: Three times a day (TID) | ORAL | Status: DC
  Administered 2017-09-24 – 2017-09-25 (×3): 50 mg via ORAL
  Filled 2017-09-24 (×3): qty 1

## 2017-09-24 MED ORDER — THIAMINE HCL 100 MG PO TABS: 100.0000 mg | ORAL_TABLET | Freq: Every day | ORAL | Status: DC

## 2017-09-24 MED ORDER — LISINOPRIL 5 MG PO TABS: 5.0000 mg | ORAL_TABLET | Freq: Every day | ORAL | 0 refills | Status: DC

## 2017-09-24 MED ORDER — ACETAMINOPHEN 325 MG PO TABS: 650.0000 mg | ORAL_TABLET | Freq: Four times a day (QID) | ORAL | Status: DC | PRN

## 2017-09-24 MED ORDER — POTASSIUM CHLORIDE CRYS ER 20 MEQ PO TBCR
60.0000 meq | EXTENDED_RELEASE_TABLET | ORAL | Status: AC
  Administered 2017-09-24 (×2): 60 meq via ORAL
  Filled 2017-09-24 (×2): qty 3

## 2017-09-24 MED ORDER — CLONIDINE HCL 0.1 MG PO TABS: 0.1000 mg | ORAL_TABLET | Freq: Two times a day (BID) | ORAL | 11 refills | Status: DC

## 2017-09-24 MED ORDER — HYDRALAZINE HCL 50 MG PO TABS: 50.0000 mg | ORAL_TABLET | Freq: Three times a day (TID) | ORAL | 0 refills | Status: DC

## 2017-09-24 NOTE — Discharge Summary (Addendum)
Physician Discharge Summary  Timothy Clark ZTI:[id number] DOB: 01-Jan-1971 DOA: 09/20/2017  PCP: Rosalee Kaufman, PA-C  Admit date: 09/20/2017 Discharge date:09/26/2017 Admitted From:home Disposition: [facility]  Recommendations for Outpatient Follow-up:  1. Follow up with PCP in 1-2 weeks 2. Please obtain BMP/CBC in one week  Home Health:none Equipment/Devices:none Discharge Condition:stable CODE STATUS full Diet recommendation cardiac modified carb  Brief/Interim Summary:46 y.o.malewitha history of obesity, HTN, IDT2DM, depression, GERD, chronic pain, glaucoma who was sent from [facility] for medical clearance prior to admission for suicidal ideation.He reported a desire to walk into traffic to kill himself. In the ED he was found to be dehydrated with acute kidney injury, hypokalemia, and we were asked to admit.      Discharge Diagnoses:  Principal Problem:   Depression Active Problems:   Diabetes mellitus type 2 with complications, uncontrolled ([place])   Hyperlipidemia   Essential hypertension   Gastroparesis due to DM (HCC)   AKI (acute kidney injury) ([place])   Insulin dependent diabetes mellitus ([place])   Hypokalemia   GERD (gastroesophageal reflux disease)   Acute kidney injury ([place])   Chronic abdominal pain   Suicidal ideation   Hypertensive urgency   Prolonged QT interval Suicidal ideation-patient reports he is not having any thoughts to kill himself and is requesting to go home.seen by psych.patient ok to be discharged and follow up with psych as outpatient.SW arranging the same.  Acute kidney injury on chronic kidney disease stage III -Likely prerenal, creatinine improved with IV fluids -Baseline creatinine 1.2-1.6, currently at baseline  Hypertension with hypertensive urgency -Continue metoprolol, hydralazine, clonidine,lisinopril.  Hypokalemia repleted.  Chronic abdominal pain -This is been worked up in the past, continue home medications  Type 2  diabetes mellitus -Continue 70/30.  Right toe infection -December 2018, now status post partial first ray amputation on August 20, 2017 -Follow-up with podiatry as an outpatient     Discharge Instructions   Allergies as of 09/24/2017      Reactions   Donnatal [pb-hyoscy-atropine-scopol Er] Anaphylaxis   Reaction to GI cocktail   Fish Allergy Anaphylaxis, Hives, Rash   Haldol [haloperidol Lactate] Other (See Comments)   Chest Pain   Lidocaine Anaphylaxis   Reaction to GI cocktail   Maalox [calcium Carbonate Antacid] Anaphylaxis   Reaction to GI cocktail   Aspirin Hives, Itching   Bactrim [sulfamethoxazole-trimethoprim] Itching   Keflex [cephalexin] Hives, Itching, Nausea And Vomiting   Penicillins Hives, Itching   Has patient had a PCN reaction causing immediate rash, facial/tongue/throat swelling, SOB or lightheadedness with hypotension: yes Has patient had a PCN reaction causing severe rash involving mucus membranes or skin necrosis: yes Has patient had a PCN reaction that required hospitalization no Has patient had a PCN reaction occurring within the last 10 years: yes If all of the above answers are "NO", then may proceed with Cephalospor   Phenergan [promethazine Hcl] Nausea And Vomiting      Medication List    STOP taking these medications   ciprofloxacin 500 MG tablet Commonly known as:  CIPRO   cyclobenzaprine 10 MG tablet Commonly known as:  FLEXERIL   dicyclomine 10 MG capsule Commonly known as:  BENTYL   gentamicin cream 0.1 % Commonly known as:  GARAMYCIN   hydrochlorothiazide 12.5 MG tablet Commonly known as:  HYDRODIURIL   insulin detemir 100 UNIT/ML injection Commonly known as:  LEVEMIR   insulin NPH-regular Human (70-30) 100 UNIT/ML injection Commonly known as:  NOVOLIN 70/30 Replaced by:  insulin aspart protamine- aspart (  70-30) 100 UNIT/ML injection   lisinopril 40 MG tablet Commonly known as:  PRINIVIL,ZESTRIL   LORazepam 0.5 MG  tablet Commonly known as:  ATIVAN   metoCLOPramide 10 MG tablet Commonly known as:  REGLAN   ondansetron 4 MG tablet Commonly known as:  ZOFRAN   potassium chloride SA 20 MEQ tablet Commonly known as:  K-DUR,KLOR-CON     TAKE these medications   acetaminophen 325 MG tablet Commonly known as:  TYLENOL Take 2 tablets (650 mg total) by mouth every 6 (six) hours as needed for mild pain (or Fever >/= 101).   atorvastatin 10 MG tablet Commonly known as:  LIPITOR Take 10 mg by mouth every evening.   brimonidine 0.15 % ophthalmic solution Commonly known as:  ALPHAGAN Place 1 drop into both eyes 2 (two) times daily.   cetirizine 10 MG tablet Commonly known as:  ZYRTEC Take 10 mg by mouth daily.   cloNIDine 0.1 MG tablet Commonly known as:  CATAPRES Take 1 tablet (0.1 mg total) by mouth 2 (two) times daily.   folic acid 1 MG tablet Commonly known as:  FOLVITE Take 1 tablet (1 mg total) by mouth daily.   gabapentin 600 MG tablet Commonly known as:  NEURONTIN Take 600 mg by mouth 3 (three) times daily with meals as needed (nerve pain).   hydrALAZINE 50 MG tablet Commonly known as:  APRESOLINE Take 1 tablet (50 mg total) by mouth every 8 (eight) hours.   insulin aspart protamine- aspart (70-30) 100 UNIT/ML injection Commonly known as:  NOVOLOG MIX 70/30 Inject 0.25 mLs (25 Units total) into the skin 2 (two) times daily with a meal. Replaces:  insulin NPH-regular Human (70-30) 100 UNIT/ML injection   metoprolol succinate 50 MG 24 hr tablet Commonly known as:  TOPROL-XL Take 50 mg by mouth daily. Take with or immediately following a meal.   pantoprazole 40 MG tablet Commonly known as:  PROTONIX TAKE 1 TABLET BY MOUTH TWICE DAILY BEFORE  A  MEAL   sucralfate 1 g tablet Commonly known as:  CARAFATE TAKE ONE TABLET BY MOUTH 4 TIMES DAILY   thiamine 100 MG tablet Take 1 tablet (100 mg total) by mouth daily.   timolol 0.5 % ophthalmic solution Commonly known as:   TIMOPTIC Place 1 drop into both eyes 2 (two) times daily.      Follow-up Information    Rosalee Kaufman, PA-C Follow up.   Specialty:  Physician Assistant Contact information: [place] [id number] [phone]          Allergies  Allergen Reactions  . Donnatal [Pb-Hyoscy-Atropine-Scopol Er] Anaphylaxis    Reaction to GI cocktail  . Fish Allergy Anaphylaxis, Hives and Rash  . Haldol [Haloperidol Lactate] Other (See Comments)    Chest Pain  . Lidocaine Anaphylaxis    Reaction to GI cocktail  . Maalox [Calcium Carbonate Antacid] Anaphylaxis    Reaction to GI cocktail  . Aspirin Hives and Itching  . Bactrim [Sulfamethoxazole-Trimethoprim] Itching  . Keflex [Cephalexin] Hives, Itching and Nausea And Vomiting  . Penicillins Hives and Itching    Has patient had a PCN reaction causing immediate rash, facial/tongue/throat swelling, SOB or lightheadedness with hypotension: yes Has patient had a PCN reaction causing severe rash involving mucus membranes or skin necrosis: yes Has patient had a PCN reaction that required hospitalization no Has patient had a PCN reaction occurring within the last 10 years: yes If all of the above answers are "NO", then may  proceed with Cephalospor  . Phenergan [Promethazine Hcl] Nausea And Vomiting    Consultations: none  Procedures/Studies: Ct Head Wo Contrast  Result Date: 09/21/2017 CLINICAL DATA:  47 y/o M; [facility] for medical clearance, altered level of consciousness. EXAM: CT HEAD WITHOUT CONTRAST TECHNIQUE: Contiguous axial images were obtained from the base of the skull through the vertex without intravenous contrast. COMPARISON:  08/30/2017 CT head. FINDINGS: Brain: No evidence of acute infarction, hemorrhage, hydrocephalus, extra-axial collection or mass lesion/mass effect. Vascular: Calcific atherosclerosis of carotid siphons. No hyperdense vessel identified. Skull: Small focus of scalp scarring in the  right parietal region is stable. No calvarial fracture. Sinuses/Orbits: No acute finding. Other: Bilateral intra-ocular lens replacement and left glaucoma device. IMPRESSION: Stable negative CT of the head. Electronically Signed   By: Kristine Garbe M.D.   On: 09/21/2017 05:38   Dg Foot Complete Right  Result Date: 08/26/2017 Please see detailed radiograph report in office note.   (Echo, Carotid, EGD, Colonoscopy, ERCP)    Subjective:   Discharge Exam: Vitals:   09/23/17 2140 09/24/17 0630  BP: 110/63 120/74  Pulse: 69 70  Resp: 18 17  Temp: 98.9 F (37.2 C) 98.1 F (36.7 C)  SpO2: 100% 100%   Vitals:   09/23/17 0617 09/23/17 1219 09/23/17 2140 09/24/17 0630  BP: 136/82 104/66 110/63 120/74  Pulse: 65  69 70  Resp: 14 18 18 17   Temp:  98.2 F (36.8 C) 98.9 F (37.2 C) 98.1 F (36.7 C)  TempSrc:  Oral Oral   SpO2: 98% 97% 100% 100%  Weight:        General: Pt is alert, awake, not in acute distress Cardiovascular: RRR, S1/S2 +, no rubs, no gallops Respiratory: CTA bilaterally, no wheezing, no rhonchi Abdominal: Soft, NT, ND, bowel sounds + Extremities: no edema, no cyanosis    The results of significant diagnostics from this hospitalization (including imaging, microbiology, ancillary and laboratory) are listed below for reference.     Microbiology: No results found for this or any previous visit (from the past 240 hour(s)).   Labs: BNP (last 3 results) No results for input(s): BNP in the last 8760 hours. Basic Metabolic Panel: Recent Labs  Lab 09/20/17 2344 09/21/17 0430 09/21/17 1522 09/22/17 0605 09/22/17 1843 09/23/17 0901 09/24/17 0712  NA  --  134* 139 140  --  130* 130*  K  --  2.6* 3.0* 2.5* 2.4* 3.2* 3.3*  CL  --  98* 103 102  --  97* 97*  CO2  --  25 25 26   --  21* 23  GLUCOSE  --  212* 207* 107*  --  143* 89  BUN  --  17 16 13   --  14 20  CREATININE  --  2.05* 2.00* 1.53*  --  1.66* 2.09*  CALCIUM  --  7.9* 8.1* 8.2*  --  7.9*  7.9*  MG 1.9  --   --  1.8  --   --   --    Liver Function Tests: Recent Labs  Lab 09/20/17 2153 09/21/17 0430  AST 23 19  ALT 14* 12*  ALKPHOS 54 48  BILITOT 0.7 0.7  PROT 6.6 6.2*  ALBUMIN 3.3* 3.0*   No results for input(s): LIPASE, AMYLASE in the last 168 hours. No results for input(s): AMMONIA in the last 168 hours. CBC: Recent Labs  Lab 09/20/17 2153 09/21/17 0430 09/23/17 0901  WBC 10.2 8.7 9.5  NEUTROABS  --  6.5  --  HGB 9.8* 10.2* 11.1*  HCT 27.4* 28.1* 31.0*  MCV 86.4 86.7 87.3  PLT 178 146* 179   Cardiac Enzymes: Recent Labs  Lab 09/21/17 0430 09/21/17 1522  CKTOTAL 171  --   TROPONINI 0.04* 0.04*   BNP: Invalid input(s): POCBNP CBG: Recent Labs  Lab 09/23/17 0819 09/23/17 1108 09/23/17 1633 09/23/17 2141 09/24/17 0740  GLUCAP 89 138* 144* 132* 90   D-Dimer No results for input(s): DDIMER in the last 72 hours. Hgb A1c No results for input(s): HGBA1C in the last 72 hours. Lipid Profile No results for input(s): CHOL, HDL, LDLCALC, TRIG, CHOLHDL, LDLDIRECT in the last 72 hours. Thyroid function studies No results for input(s): TSH, T4TOTAL, T3FREE, THYROIDAB in the last 72 hours.  Invalid input(s): FREET3 Anemia work up No results for input(s): VITAMINB12, FOLATE, FERRITIN, TIBC, IRON, RETICCTPCT in the last 72 hours. Urinalysis    Component Value Date/Time   COLORURINE YELLOW 07/14/2017 2200   APPEARANCEUR CLEAR 07/14/2017 2200   LABSPEC 1.013 07/14/2017 2200   PHURINE 7.0 07/14/2017 2200   GLUCOSEU >=500 (A) 07/14/2017 2200   HGBUR SMALL (A) 07/14/2017 2200   BILIRUBINUR NEGATIVE 07/14/2017 2200   KETONESUR NEGATIVE 07/14/2017 2200   PROTEINUR >=300 (A) 07/14/2017 2200   UROBILINOGEN 0.2 06/07/2015 1353   NITRITE NEGATIVE 07/14/2017 2200   LEUKOCYTESUR NEGATIVE 07/14/2017 2200   Sepsis Labs Invalid input(s): PROCALCITONIN,  WBC,  LACTICIDVEN Microbiology No results found for this or any previous visit (from the past 240  hour(s)).   Time coordinating discharge: Over 30 minutes  SIGNED:   Georgette Shell, MD  [facility] 09/24/2017, 8:36 AM If 7PM-7AM, please contact night-coverage [email or web address] Password [email or web address]

## 2017-09-24 NOTE — Consult Note (Signed)
   [facility] CM Inpatient Consult   09/24/2017  EPHREM CARRICK 1970/09/19 [id number]    Patient screened for potential [facility] Care Management services due to multiple hospitalizations.   Chart reviewed. Noted discharge plans are for inpatient [facility]. Inpatient LCSW following for placement.  No identifiable [facility] Care Management needs at this time.    Marthenia Rolling, MSN-Ed, RN,BSN [facility] Liaison [phone]

## 2017-09-24 NOTE — Progress Notes (Addendum)
CSW following for discharge needs to [facility].  CSW spoke with Otila Kluver about placement. She will inform CSW when bed is available . Patient is under IVC (paperwork current) and will transport by [facility].  Kathrin Greathouse, Latanya Presser, MSW Clinical Social Worker  [phone] 09/24/2017  9:54 AM

## 2017-09-25 LAB — GLUCOSE, CAPILLARY
Glucose-Capillary: 121 mg/dL — ABNORMAL HIGH (ref 65–99)
Glucose-Capillary: 139 mg/dL — ABNORMAL HIGH (ref 65–99)
Glucose-Capillary: 162 mg/dL — ABNORMAL HIGH (ref 65–99)
Glucose-Capillary: 199 mg/dL — ABNORMAL HIGH (ref 65–99)

## 2017-09-25 MED ORDER — HYDRALAZINE HCL 25 MG PO TABS
25.0000 mg | ORAL_TABLET | Freq: Three times a day (TID) | ORAL | Status: DC
  Administered 2017-09-25 – 2017-09-26 (×4): 25 mg via ORAL
  Filled 2017-09-25 (×4): qty 1

## 2017-09-25 MED ORDER — HYDRALAZINE HCL 25 MG PO TABS: 25.0000 mg | ORAL_TABLET | Freq: Three times a day (TID) | ORAL | 0 refills | Status: DC

## 2017-09-25 NOTE — Progress Notes (Addendum)
Patient medically ready for D/C. Patient referral sent to psychiatric hospital- CSW will inform medical staff when a bed is available.   [facility]- Pending unsure they will be able to accept patient today.  -[facility]-Denied due to medical acuity   -[facility]-Denied due to medical acuity  [facility]- Denied due to medical acuity  [facility]-pending. [facility]-Pending.   CSW updated patient and physician. CSW will continue search.    Kathrin Greathouse, Latanya Presser, MSW Clinical Social Worker  [phone] 09/25/2017  12:16 PM

## 2017-09-25 NOTE — Progress Notes (Signed)
PROGRESS NOTE    VYNCENT OVERBY  FMB:[id number] DOB: 10/23/1970 DOA: 09/20/2017 PCP: Rosalee Kaufman, PA-C  Brief Narrative: 47 y.o.malewitha history of obesity, HTN, IDT2DM, depression, GERD, chronic pain, glaucoma who was sent from [facility] for medical clearance prior to admission for suicidal ideation.He reported a desire to walk into traffic to kill himself. In the ED he was found to be dehydrated with acute kidney injury, hypokalemia, and we were asked to admit.     Assessment & Plan:   Principal Problem:   Depression Active Problems:   Diabetes mellitus type 2 with complications, uncontrolled ([place])   Hyperlipidemia   Essential hypertension   Gastroparesis due to DM (HCC)   AKI (acute kidney injury) ([place])   Insulin dependent diabetes mellitus (HCC)   Hypokalemia   GERD (gastroesophageal reflux disease)   Acute kidney injury ([place])   Chronic abdominal pain   Suicidal ideation   Hypertensive urgency   Prolonged QT interval  Suicidal ideation -Will require [facility] hospitalization once medically cleared, currently under IVC, continue sitter, continue suicide precautions  Acute kidney injury on chronic kidney disease stage III -Likely prerenal, creatinine improved with IV fluids -Baseline creatinine 1.2-1.6, currently at baseline  Hypertension with hypertensive urgency he is currently on metoprolol, hydralazine, Catapres patch.  I would decrease the dose of hydralazine to 25 mg q. 8 as his blood pressure has been low normal.  Hypokalemia -Unclear p.o. intake at home, he is on HCTZ -Replete, recheck tomorrow morning, hold HCTZ  Chronic abdominal pain -This is been worked up in the past, continue home medications  Type 2 diabetes mellitus -Continue 7030, continue sliding scale, CBG is controlled this morning 100-120s  Right toe infection -December 2018, now status post partial first ray amputation on August 20, 2017 -Follow-up with podiatry as an  outpatient     DVT prophylaxis Lovenox Code Status: Full code Family Communication: None Disposition Plan: TBD Consultants: None  Procedures: None Antimicrobials: None  Subjective: Feels okay   Objective: Vitals:   09/24/17 1331 09/24/17 2026 09/25/17 0226 09/25/17 0700  BP: 110/70 118/70 (!) 104/58 114/77  Pulse:  71 67 70  Resp:  20 20 18   Temp:  98.8 F (37.1 C) 98.8 F (37.1 C) 98.7 F (37.1 C)  TempSrc:  Oral Oral Oral  SpO2:  98% 99% 100%  Weight:        Intake/Output Summary (Last 24 hours) at 09/25/2017 1005 Last data filed at 09/25/2017 0905 Gross per 24 hour  Intake 1143 ml  Output 2550 ml  Net -1407 ml   Filed Weights   09/21/17 0620  Weight: 134.7 kg (297 lb)    Examination:  General exam: Appears calm and comfortable  Respiratory system: Clear to auscultation. Respiratory effort normal. Cardiovascular system: S1 & S2 heard, RRR. No JVD, murmurs, rubs, gallops or clicks. No pedal edema. Gastrointestinal system: Abdomen is nondistended, soft and nontender. No organomegaly or masses felt. Normal bowel sounds heard. Central nervous system: Alert and oriented. No focal neurological deficits. Extremities: Symmetric 5 x 5 power. Skin: No rashes, lesions or ulcers Psychiatry: Judgement and insight appear normal. Mood & affect appropriate.     Data Reviewed: I have personally reviewed following labs and imaging studies  CBC: Recent Labs  Lab 09/20/17 2153 09/21/17 0430 09/23/17 0901  WBC 10.2 8.7 9.5  NEUTROABS  --  6.5  --   HGB 9.8* 10.2* 11.1*  HCT 27.4* 28.1* 31.0*  MCV 86.4 86.7 87.3  PLT 178 146*  517   Basic Metabolic Panel: Recent Labs  Lab 09/20/17 2344 09/21/17 0430 09/21/17 1522 09/22/17 0605 09/22/17 1843 09/23/17 0901 09/24/17 0712  NA  --  134* 139 140  --  130* 130*  K  --  2.6* 3.0* 2.5* 2.4* 3.2* 3.3*  CL  --  98* 103 102  --  97* 97*  CO2  --  25 25 26   --  21* 23  GLUCOSE  --  212* 207* 107*  --  143* 89  BUN   --  17 16 13   --  14 20  CREATININE  --  2.05* 2.00* 1.53*  --  1.66* 2.09*  CALCIUM  --  7.9* 8.1* 8.2*  --  7.9* 7.9*  MG 1.9  --   --  1.8  --   --   --    GFR: Estimated Creatinine Clearance: 62 mL/min (A) (by C-G formula based on SCr of 2.09 mg/dL (H)). Liver Function Tests: Recent Labs  Lab 09/20/17 2153 09/21/17 0430  AST 23 19  ALT 14* 12*  ALKPHOS 54 48  BILITOT 0.7 0.7  PROT 6.6 6.2*  ALBUMIN 3.3* 3.0*   No results for input(s): LIPASE, AMYLASE in the last 168 hours. No results for input(s): AMMONIA in the last 168 hours. Coagulation Profile: No results for input(s): INR, PROTIME in the last 168 hours. Cardiac Enzymes: Recent Labs  Lab 09/21/17 0430 09/21/17 1522  CKTOTAL 171  --   TROPONINI 0.04* 0.04*   BNP (last 3 results) No results for input(s): PROBNP in the last 8760 hours. HbA1C: No results for input(s): HGBA1C in the last 72 hours. CBG: Recent Labs  Lab 09/24/17 0740 09/24/17 1134 09/24/17 1642 09/24/17 2140 09/25/17 0734  GLUCAP 90 113* 142* 157* 121*   Lipid Profile: No results for input(s): CHOL, HDL, LDLCALC, TRIG, CHOLHDL, LDLDIRECT in the last 72 hours. Thyroid Function Tests: No results for input(s): TSH, T4TOTAL, FREET4, T3FREE, THYROIDAB in the last 72 hours. Anemia Panel: No results for input(s): VITAMINB12, FOLATE, FERRITIN, TIBC, IRON, RETICCTPCT in the last 72 hours. Sepsis Labs: No results for input(s): PROCALCITON, LATICACIDVEN in the last 168 hours.  No results found for this or any previous visit (from the past 240 hour(s)).       Radiology Studies: No results found.      Scheduled Meds: . atorvastatin  10 mg Oral QPC supper  . brimonidine  1 drop Both Eyes BID  . cloNIDine  0.1 mg Oral BID  . dicyclomine  10 mg Oral TID AC & HS  . enoxaparin (LOVENOX) injection  40 mg Subcutaneous Q24H  . folic acid  1 mg Oral Daily  . hydrALAZINE  50 mg Oral Q8H  . insulin aspart  0-5 Units Subcutaneous QHS  . insulin  aspart  0-9 Units Subcutaneous TID WC  . insulin aspart protamine- aspart  25 Units Subcutaneous BID WC  . loratadine  10 mg Oral Daily  . LORazepam  0-4 mg Oral Q12H  . metoprolol succinate  50 mg Oral Daily  . pantoprazole  40 mg Oral Daily  . potassium chloride  40 mEq Oral BID  . protein supplement shake  11 oz Oral BID BM  . sodium chloride flush  3 mL Intravenous Q12H  . sucralfate  1 g Oral QID  . thiamine  100 mg Oral Daily   Or  . thiamine  100 mg Intravenous Daily  . timolol  1 drop Both Eyes BID  Continuous Infusions:   LOS: 3 days   Georgette Shell, MD [facility]   If 7PM-7AM, please contact night-coverage [email or web address] Password [email or web address] 09/25/2017, 10:05 AM

## 2017-09-25 NOTE — Progress Notes (Signed)
Date: September 25, 2017 Velva Harman, BSN, [place], [place] Chart and notes review for patient progress and needs. Awaiting bed placement at [facility]- pt is ivc'd Will follow for case management and discharge needs. No cm or discharge needs present at time of this review. Next review date: [id number]

## 2017-09-25 NOTE — Care Management Important Message (Signed)
Important Message  Patient Details  Name: Timothy Clark MRN: [id number] Date of Birth: August 12, 1970   Medicare Important Message Given:  Yes    Kerin Salen 09/25/2017, 10:23 AMImportant Message  Patient Details  Name: Timothy Clark MRN: [id number] Date of Birth: Oct 02, 1970   Medicare Important Message Given:  Yes    Kerin Salen 09/25/2017, 10:23 AM

## 2017-09-26 ENCOUNTER — Other Ambulatory Visit: Payer: Self-pay

## 2017-09-26 LAB — GLUCOSE, CAPILLARY
Glucose-Capillary: 122 mg/dL — ABNORMAL HIGH (ref 65–99)
Glucose-Capillary: 163 mg/dL — ABNORMAL HIGH (ref 65–99)

## 2017-09-26 NOTE — Progress Notes (Addendum)
1:01 PM Per attending MD, psych has come back to see patient and feel he is able to be cleared and discharged home. LCSW has completed paperwork as patient was on IVC and commitment. Plan: Patient plans to DC home. LCSW placed options for after care on patient's AVS for follow up. Unable to schedule due to weekend status.  Referrals for patient to call on Monday.   1000am  LCSW followed up with [facility] to see if patient could be admitted to [facility]. Will review case again and see bed status on unit.  Will follow up with [facility] once the above completed. Patient currently under IVC.  Lane Hacker, MSW Clinical Social Work: [employer] Coverage for :  [phone]

## 2017-09-26 NOTE — Consult Note (Signed)
[facility] Consult   Reason for Consult: suicidal thoughts Referring Physician:  Dr. Rodena Piety Patient Identification: Timothy Clark MRN:  [id number] Principal Diagnosis: Depression Diagnosis:   Patient Active Problem List   Diagnosis Date Noted  . Acute kidney injury ([place]) [N17.9] 09/21/2017  . Chronic abdominal pain [R10.9, G89.29] 09/21/2017  . Suicidal ideation [R45.851] 09/21/2017  . Hypertensive urgency [I16.0] 09/21/2017  . Prolonged QT interval [R94.31] 09/21/2017  . Diabetic foot infection ([place]) [V89.381, L08.9] 07/15/2017  . Sepsis due to cellulitis ([place]) [L03.90, A41.9] 07/14/2017  . GERD (gastroesophageal reflux disease) [K21.9] 07/14/2017  . Glaucoma [H40.9] 07/14/2017  . Anemia [D64.9] 07/14/2017  . Intractable nausea and vomiting [R11.2] 06/10/2017  . Pressure injury of skin [L89.90] 02/01/2017  . Psychosis ([place]) [F29] 01/31/2017  . Hypokalemia [E87.6] 01/31/2017  . AKI (acute kidney injury) ([place]) [N17.9] 09/07/2016  . Insulin dependent diabetes mellitus ([place]) [E11.9, Z79.4] 09/07/2016  . Orthostatic syncope [I95.1] 09/05/2016  . Syncope and collapse [R55]   . Dumping syndrome [K91.1] 04/22/2016  . Diarrhea [R19.7] 09/27/2015  . Gastroparesis due to DM ([place]) [O17.51, K31.84] 02/06/2015  . Intractable vomiting [R11.10] 01/15/2015  . Gastritis [K29.70]   . Essential hypertension [I10] 12/06/2014  . H. pylori infection [A04.8] 11/27/2014  . Mucosal abnormality of stomach [K31.89]   . Mucosal abnormality of esophagus [K22.9]   . Nausea vomiting and diarrhea [R11.2, R19.7] 10/25/2014  . DIABETIC FOOT ULCER with Osteomyelitis [L97.409] 02/14/2010  . LIMB PAIN [M79.609] 12/11/2009  . UNSPECIFIED PERIPHERAL VASCULAR DISEASE [I73.9] 10/19/2009  . 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 NUMBNESS [R20.9] 07/20/2009  . Diabetes mellitus type 2 with complications, uncontrolled ([place]) [E11.8, E11.65] 03/22/2009  . Hyperlipidemia [E78.5]  03/22/2009  . Depression [F32.9] 03/22/2009    Total Time spent with patient: 45 minutes  Subjective:   Timothy Clark is a 47 y.o. male patient admitted with low potassium level and suicidal thoughts  HPI:  Thanks for asking me to do a psychiatric consult on [name], 47 y.o.malewitha history of obesity, HTN, IDT2DM, Depression, GERD, chronic pain, glaucoma who was sent to [facility] inpatient  from [facility] for low potasium correction. Patient reports that he was brought to the hospital few days ago due to suicidal thoughts, depression, overwhelmed and stressed out by his multiple medical issues. Today, he denies suicidal ideations, depression, psychosis or delusional thinking. Patient is able to contract for safety and says he will need referral to a psychiatrist upon discharge.  Past Psychiatric History: none reported  Risk to Self: Is patient at risk for suicide?: denies Risk to Others:   Prior Inpatient Therapy:   Prior Outpatient Therapy:    Past Medical History:  Past Medical History:  Diagnosis Date  . Chronic abdominal pain   . Esophagitis   . Eye hemorrhage   . Gastritis   . Gastroparesis   . GERD (gastroesophageal reflux disease)   . Glaucoma   . Helicobacter pylori (H. pylori) infection 2016   ERADICATION DOCUMENTED VIA STOOL TEST in AUG 2016 at [facility]  . Hiccups   . Hypertension   . Nausea and vomiting    chronic, recurrent  . Type 2 diabetes mellitus with complications (HCC)    diagnosed around age 86    Past Surgical History:  Procedure Laterality Date  . CATARACT EXTRACTION W/PHACO Left 05/19/2016   Procedure: CATARACT EXTRACTION PHACO AND INTRAOCULAR LENS PLACEMENT LEFT EYE;  Surgeon: Tonny Branch, MD;  Location: [facility];  Service: Ophthalmology;  Laterality: Left;  CDE: 7.30  . CATARACT EXTRACTION W/PHACO Right 06/23/2016   Procedure: CATARACT EXTRACTION PHACO AND INTRAOCULAR LENS PLACEMENT RIGHT EYE CDE=9.87;  Surgeon: Tonny Branch, MD;  Location: [facility];   Service: Ophthalmology;  Laterality: Right;  right  . ESOPHAGOGASTRODUODENOSCOPY  2015   Dr. Britta Mccreedy  . ESOPHAGOGASTRODUODENOSCOPY N/A 10/26/2014   RMR: Distal esophagititis-likely reflux related although an element of pill induced injuruy no exclueded  status post biopsy. Diffusely abnormal gastric mucosa of uncertain signigicane -status post gastric biopsy. Focal area of excoriation in the cardia most consistant with a trauma of heaving.   . ESOPHAGOGASTRODUODENOSCOPY  08/2015   [facility]: mild esophagitis and old gastric contents  . EYE SURGERY  2015   stent placed to left eye  . EYE SURGERY    . INCISION AND DRAINAGE OF WOUND Right 07/16/2017   Procedure: IRRIGATION AND DEBRIDEMENT OF SOFT TISSUE OF ULCERATION RIGHT FOOT;  Surgeon: Caprice Beaver, DPM;  Location: [facility];  Service: Podiatry;  Laterality: Right;   Family History:  Family History  Problem Relation Age of Onset  . Ovarian cancer Mother   . Heart attack Father   . Cervical cancer Sister   . Diabetes Sister   . Colon cancer Neg Hx    Family Psychiatric  History:  Social History:  Social History   Substance and Sexual Activity  Alcohol Use No  . Alcohol/week: 0.0 oz     Social History   Substance and Sexual Activity  Drug Use No    Social History   Socioeconomic History  . Marital status: Single    Spouse name: None  . Number of children: None  . Years of education: None  . Highest education level: None  Social Needs  . Financial resource strain: None  . Food insecurity - worry: None  . Food insecurity - inability: None  . Transportation needs - medical: None  . Transportation needs - non-medical: None  Occupational History  . None  Tobacco Use  . Smoking status: Never Smoker  . Smokeless tobacco: Never Used  Substance and Sexual Activity  . Alcohol use: No    Alcohol/week: 0.0 oz  . Drug use: No  . Sexual activity: Yes  Other Topics Concern  . None  Social History Narrative  . None    Additional Social History:    Allergies:   Allergies  Allergen Reactions  . Donnatal [Pb-Hyoscy-Atropine-Scopol Er] Anaphylaxis    Reaction to GI cocktail  . Fish Allergy Anaphylaxis, Hives and Rash  . Haldol [Haloperidol Lactate] Other (See Comments)    Chest Pain  . Lidocaine Anaphylaxis    Reaction to GI cocktail  . Maalox [Calcium Carbonate Antacid] Anaphylaxis    Reaction to GI cocktail  . Aspirin Hives and Itching  . Bactrim [Sulfamethoxazole-Trimethoprim] Itching  . Keflex [Cephalexin] Hives, Itching and Nausea And Vomiting  . Penicillins Hives and Itching    Has patient had a PCN reaction causing immediate rash, facial/tongue/throat swelling, SOB or lightheadedness with hypotension: yes Has patient had a PCN reaction causing severe rash involving mucus membranes or skin necrosis: yes Has patient had a PCN reaction that required hospitalization no Has patient had a PCN reaction occurring within the last 10 years: yes If all of the above answers are "NO", then may proceed with Cephalospor  . Phenergan [Promethazine Hcl] Nausea And Vomiting    Labs:  Results for orders placed or performed during the hospital encounter of 09/20/17 (from the past 48 hour(s))  Glucose, capillary  Status: Abnormal   Collection Time: 09/24/17  4:42 PM  Result Value Ref Range   Glucose-Capillary 142 (H) 65 - 99 mg/dL  Glucose, capillary     Status: Abnormal   Collection Time: 09/24/17  9:40 PM  Result Value Ref Range   Glucose-Capillary 157 (H) 65 - 99 mg/dL  Glucose, capillary     Status: Abnormal   Collection Time: 09/25/17  7:34 AM  Result Value Ref Range   Glucose-Capillary 121 (H) 65 - 99 mg/dL  Glucose, capillary     Status: Abnormal   Collection Time: 09/25/17 11:08 AM  Result Value Ref Range   Glucose-Capillary 139 (H) 65 - 99 mg/dL  Glucose, capillary     Status: Abnormal   Collection Time: 09/25/17  4:42 PM  Result Value Ref Range   Glucose-Capillary 162 (H) 65 - 99  mg/dL  Glucose, capillary     Status: Abnormal   Collection Time: 09/25/17  8:40 PM  Result Value Ref Range   Glucose-Capillary 199 (H) 65 - 99 mg/dL   Comment 1 Notify RN    Comment 2 Document in Chart   Glucose, capillary     Status: Abnormal   Collection Time: 09/26/17  7:44 AM  Result Value Ref Range   Glucose-Capillary 122 (H) 65 - 99 mg/dL  Glucose, capillary     Status: Abnormal   Collection Time: 09/26/17 11:36 AM  Result Value Ref Range   Glucose-Capillary 163 (H) 65 - 99 mg/dL    Current Facility-Administered Medications  Medication Dose Route Frequency Provider Last Rate Last Dose  . acetaminophen (TYLENOL) tablet 650 mg  650 mg Oral Q6H PRN Patrecia Pour, MD       Or  . acetaminophen (TYLENOL) suppository 650 mg  650 mg Rectal Q6H PRN Patrecia Pour, MD      . atorvastatin (LIPITOR) tablet 10 mg  10 mg Oral QPC supper Antonietta Breach, PA-C   10 mg at 09/25/17 1732  . brimonidine (ALPHAGAN) 0.15 % ophthalmic solution 1 drop  1 drop Both Eyes BID Antonietta Breach, PA-C   1 drop at 09/26/17 0951  . cloNIDine (CATAPRES) tablet 0.1 mg  0.1 mg Oral BID Caren Griffins, MD   0.1 mg at 09/26/17 0947  . cyclobenzaprine (FLEXERIL) tablet 10 mg  10 mg Oral QHS PRN Antonietta Breach, PA-C      . dicyclomine (BENTYL) capsule 10 mg  10 mg Oral TID AC & HS Antonietta Breach, PA-C   10 mg at 09/26/17 1142  . enoxaparin (LOVENOX) injection 40 mg  40 mg Subcutaneous Q24H Patrecia Pour, MD   40 mg at 09/25/17 2301  . folic acid (FOLVITE) tablet 1 mg  1 mg Oral Daily Patrecia Pour, MD   1 mg at 09/26/17 0947  . gabapentin (NEURONTIN) capsule 600 mg  600 mg Oral TID WC PRN Antonietta Breach, PA-C   600 mg at 09/23/17 1455  . hydrALAZINE (APRESOLINE) injection 10 mg  10 mg Intravenous Q2H PRN Patrecia Pour, MD   10 mg at 09/22/17 1313  . hydrALAZINE (APRESOLINE) tablet 25 mg  25 mg Oral Q8H Georgette Shell, MD   25 mg at 09/26/17 3846  . insulin aspart (novoLOG) injection 0-5 Units  0-5 Units Subcutaneous QHS  Vance Gather B, MD      . insulin aspart (novoLOG) injection 0-9 Units  0-9 Units Subcutaneous TID WC Patrecia Pour, MD   2 Units at 09/26/17 1142  .  insulin aspart protamine- aspart (NOVOLOG MIX 70/30) injection 25 Units  25 Units Subcutaneous BID WC Patrecia Pour, MD   25 Units at 09/26/17 0758  . loratadine (CLARITIN) tablet 10 mg  10 mg Oral Daily Antonietta Breach, PA-C   10 mg at 09/26/17 0947  . metoprolol succinate (TOPROL-XL) 24 hr tablet 50 mg  50 mg Oral Daily Antonietta Breach, PA-C   50 mg at 09/26/17 0948  . pantoprazole (PROTONIX) EC tablet 40 mg  40 mg Oral Daily Antonietta Breach, PA-C   40 mg at 09/26/17 0947  . potassium chloride SA (K-DUR,KLOR-CON) CR tablet 40 mEq  40 mEq Oral BID Georgette Shell, MD   40 mEq at 09/26/17 0947  . protein supplement (PREMIER PROTEIN) liquid  11 oz Oral BID BM Caren Griffins, MD   11 oz at 09/25/17 1306  . sodium chloride flush (NS) 0.9 % injection 3 mL  3 mL Intravenous Q12H Patrecia Pour, MD   3 mL at 09/25/17 0905  . sucralfate (CARAFATE) tablet 1 g  1 g Oral QID Antonietta Breach, PA-C   1 g at 09/26/17 0947  . thiamine (VITAMIN B-1) tablet 100 mg  100 mg Oral Daily Vance Gather B, MD   100 mg at 09/26/17 3299   Or  . thiamine (B-1) injection 100 mg  100 mg Intravenous Daily Vance Gather B, MD      . timolol (TIMOPTIC) 0.5 % ophthalmic solution 1 drop  1 drop Both Eyes BID Antonietta Breach, PA-C   1 drop at 09/26/17 2426    Musculoskeletal: Strength & Muscle Tone: within normal limits Gait & Station: normal Patient leans: N/A  Psychiatric Specialty Exam: Physical Exam  Psychiatric: He has a normal mood and affect. His speech is normal and behavior is normal. Judgment and thought content normal. Cognition and memory are normal.    Review of Systems  Constitutional: Negative.   HENT: Negative.   Eyes: Negative.   Respiratory: Negative.   Cardiovascular: Negative.   Gastrointestinal: Negative.   Genitourinary: Negative.   Musculoskeletal: Negative.    Skin: Negative.   Neurological: Negative.   Endo/Heme/Allergies: Negative.   Psychiatric/Behavioral: Negative.     Blood pressure 131/77, pulse 72, temperature 98.9 F (37.2 C), temperature source Oral, resp. rate 18, height 6' (1.829 m), weight 136.1 kg (300 lb), SpO2 98 %.Body mass index is 40.69 kg/m.  General Appearance: Casual  Eye Contact:  Good  Speech:  Clear and Coherent  Volume:  Normal  Mood:  Euthymic  Affect:  Appropriate  Thought Process:  Coherent and Descriptions of Associations: Intact  Orientation:  Full (Time, Place, and Person)  Thought Content:  Logical  Suicidal Thoughts:  No  Homicidal Thoughts:  No  Memory:  Immediate;   Good Recent;   Fair Remote;   Fair  Judgement:  Fair  Insight:  Fair  Psychomotor Activity:  Normal  Concentration:  Concentration: Fair and Attention Span: Fair  Recall:  [employer] of Knowledge:  Good  Language:  Good  Akathisia:  No  Handed:  Right  AIMS (if indicated):     Assets:  Communication Skills Desire for Improvement Social Support  ADL's:  Intact  Cognition:  WNL  Sleep:   fair     Treatment Plan Summary Patient with no prior history of mental illness who presents to the hospital with symptoms of depression few days ago. However, he is stable and denies any psychiatric symptoms.  Plan/Recomendation: Unit social  worker to refer patient to a psychiatric upon discharge for medication management/counseling.  Disposition: No evidence of imminent risk to self or others at present.   Patient does not meet criteria for psychiatric inpatient admission. Supportive therapy provided about ongoing stressors.  Corena Pilgrim, MD 09/26/2017 1:24 PM

## 2017-09-26 NOTE — Progress Notes (Signed)
Discussed with patient and sister discharge instructions, they verbalized agreement and understanding.  Patient to go home in private vehicle with all belongings.

## 2017-09-26 NOTE — Progress Notes (Addendum)
PROGRESS NOTE    Timothy Clark  WFU:[id number] DOB: 07-Dec-1970 DOA: 09/20/2017 PCP: Rosalee Kaufman, PA-C  Brief Narrative:47 y.o.malewitha history of obesity, HTN, IDT2DM, depression, GERD, chronic pain, glaucoma who was sent from [facility] for medical clearance prior to admission for suicidal ideation.He reported a desire to walk into traffic to kill himself. In the ED he was found to be dehydrated with acute kidney injury, hypokalemia, and we were asked to admit.   Assessment & Plan:   Principal Problem:   Depression Active Problems:   Diabetes mellitus type 2 with complications, uncontrolled ([place])   Hyperlipidemia   Essential hypertension   Gastroparesis due to DM (HCC)   AKI (acute kidney injury) ([place])   Insulin dependent diabetes mellitus (HCC)   Hypokalemia   GERD (gastroesophageal reflux disease)   Acute kidney injury ([place])   Chronic abdominal pain   Suicidal ideation   Hypertensive urgency   Prolonged QT interval  Suicidal ideation -Will require [facility] hospitalization once medically cleared, currently under IVC, continue sitter, continue suicide precautions  Acute kidney injury on chronic kidney disease stage III -Likely prerenal, creatinine improved with IV fluids -Baseline creatinine 1.2-1.6, currently at baseline  Hypertension with hypertensive urgency he is currently on metoprolol, hydralazine, Catapres patch.  I would decrease the dose of hydralazine to 25 mg q. 8 as his blood pressure has been low normal.  Hypokalemia -Unclear p.o. intake at home, he is on HCTZ -Replete, recheck tomorrow morning, hold HCTZ  Chronic abdominal pain -This is been worked up in the past, continue home medications  Type 2 diabetes mellitus -Continue 7030, continue sliding scale, CBG is controlled this morning 100-120s  Right toe infection -December 2018, now status post partial first ray amputation on August 20, 2017 -Follow-up with podiatry as an  outpatient      DVT prophylaxis:lovenox Code Status:full Family Communicationnone Disposition Plan:to bhh Consultants:  none  Procedures:none Antimicrobials:  none Subjective:no complaints   Objective: Vitals:   09/25/17 1800 09/25/17 2042 09/26/17 0606 09/26/17 0700  BP: (!) 156/81 134/80 131/77   Pulse: 72 73 72   Resp: 18 18 18    Temp: 98.5 F (36.9 C) 98.8 F (37.1 C) 98.9 F (37.2 C)   TempSrc: Oral Oral Oral   SpO2: 100% 99% 98%   Weight:    136.1 kg (300 lb)  Height:    6' (1.829 m)    Intake/Output Summary (Last 24 hours) at 09/26/2017 1232 Last data filed at 09/26/2017 0830 Gross per 24 hour  Intake 960 ml  Output 3954 ml  Net -2994 ml   Filed Weights   09/21/17 0620 09/26/17 0700  Weight: 134.7 kg (297 lb) 136.1 kg (300 lb)    Examination:  General exam: Appears calm and comfortable  Respiratory system: Clear to auscultation. Respiratory effort normal. Cardiovascular system: S1 & S2 heard, RRR. No JVD, murmurs, rubs, gallops or clicks. No pedal edema. Gastrointestinal system: Abdomen is nondistended, soft and nontender. No organomegaly or masses felt. Normal bowel sounds heard. Central nervous system: Alert and oriented. No focal neurological deficits. Extremities: Symmetric 5 x 5 power. Skin: No rashes, lesions or ulcers Psychiatry: Judgement and insight appear normal. Mood & affect appropriate.     Data Reviewed: I have personally reviewed following labs and imaging studies  CBC: Recent Labs  Lab 09/20/17 2153 09/21/17 0430 09/23/17 0901  WBC 10.2 8.7 9.5  NEUTROABS  --  6.5  --   HGB 9.8* 10.2* 11.1*  HCT 27.4* 28.1*  31.0*  MCV 86.4 86.7 87.3  PLT 178 146* 384   Basic Metabolic Panel: Recent Labs  Lab 09/20/17 2344 09/21/17 0430 09/21/17 1522 09/22/17 0605 09/22/17 1843 09/23/17 0901 09/24/17 0712  NA  --  134* 139 140  --  130* 130*  K  --  2.6* 3.0* 2.5* 2.4* 3.2* 3.3*  CL  --  98* 103 102  --  97* 97*  CO2  --  25  25 26   --  21* 23  GLUCOSE  --  212* 207* 107*  --  143* 89  BUN  --  17 16 13   --  14 20  CREATININE  --  2.05* 2.00* 1.53*  --  1.66* 2.09*  CALCIUM  --  7.9* 8.1* 8.2*  --  7.9* 7.9*  MG 1.9  --   --  1.8  --   --   --    GFR: Estimated Creatinine Clearance: 62.4 mL/min (A) (by C-G formula based on SCr of 2.09 mg/dL (H)). Liver Function Tests: Recent Labs  Lab 09/20/17 2153 09/21/17 0430  AST 23 19  ALT 14* 12*  ALKPHOS 54 48  BILITOT 0.7 0.7  PROT 6.6 6.2*  ALBUMIN 3.3* 3.0*   No results for input(s): LIPASE, AMYLASE in the last 168 hours. No results for input(s): AMMONIA in the last 168 hours. Coagulation Profile: No results for input(s): INR, PROTIME in the last 168 hours. Cardiac Enzymes: Recent Labs  Lab 09/21/17 0430 09/21/17 1522  CKTOTAL 171  --   TROPONINI 0.04* 0.04*   BNP (last 3 results) No results for input(s): PROBNP in the last 8760 hours. HbA1C: No results for input(s): HGBA1C in the last 72 hours. CBG: Recent Labs  Lab 09/25/17 1108 09/25/17 1642 09/25/17 2040 09/26/17 0744 09/26/17 1136  GLUCAP 139* 162* 199* 122* 163*   Lipid Profile: No results for input(s): CHOL, HDL, LDLCALC, TRIG, CHOLHDL, LDLDIRECT in the last 72 hours. Thyroid Function Tests: No results for input(s): TSH, T4TOTAL, FREET4, T3FREE, THYROIDAB in the last 72 hours. Anemia Panel: No results for input(s): VITAMINB12, FOLATE, FERRITIN, TIBC, IRON, RETICCTPCT in the last 72 hours. Sepsis Labs: No results for input(s): PROCALCITON, LATICACIDVEN in the last 168 hours.  No results found for this or any previous visit (from the past 240 hour(s)).       Radiology Studies: No results found.      Scheduled Meds: . atorvastatin  10 mg Oral QPC supper  . brimonidine  1 drop Both Eyes BID  . cloNIDine  0.1 mg Oral BID  . dicyclomine  10 mg Oral TID AC & HS  . enoxaparin (LOVENOX) injection  40 mg Subcutaneous Q24H  . folic acid  1 mg Oral Daily  . hydrALAZINE  25  mg Oral Q8H  . insulin aspart  0-5 Units Subcutaneous QHS  . insulin aspart  0-9 Units Subcutaneous TID WC  . insulin aspart protamine- aspart  25 Units Subcutaneous BID WC  . loratadine  10 mg Oral Daily  . metoprolol succinate  50 mg Oral Daily  . pantoprazole  40 mg Oral Daily  . potassium chloride  40 mEq Oral BID  . protein supplement shake  11 oz Oral BID BM  . sodium chloride flush  3 mL Intravenous Q12H  . sucralfate  1 g Oral QID  . thiamine  100 mg Oral Daily   Or  . thiamine  100 mg Intravenous Daily  . timolol  1 drop Both Eyes BID  Continuous Infusions:   LOS: 4 days      Georgette Shell, MD [facility] If 7PM-7AM, please contact night-coverage [email or web address] Password [facility] 09/26/2017, 12:32 PM

## 2017-10-01 NOTE — Progress Notes (Signed)
This encounter was created in error - please disregard.

## 2017-10-05 DIAGNOSIS — I10 Essential (primary) hypertension: Secondary | ICD-10-CM | POA: Diagnosis not present

## 2017-10-05 DIAGNOSIS — M869 Osteomyelitis, unspecified: Secondary | ICD-10-CM | POA: Diagnosis not present

## 2017-10-05 DIAGNOSIS — K3184 Gastroparesis: Secondary | ICD-10-CM | POA: Diagnosis not present

## 2017-10-05 DIAGNOSIS — F419 Anxiety disorder, unspecified: Secondary | ICD-10-CM | POA: Diagnosis not present

## 2017-10-05 DIAGNOSIS — E782 Mixed hyperlipidemia: Secondary | ICD-10-CM | POA: Diagnosis not present

## 2017-10-05 DIAGNOSIS — E1143 Type 2 diabetes mellitus with diabetic autonomic (poly)neuropathy: Secondary | ICD-10-CM | POA: Diagnosis not present

## 2017-10-05 DIAGNOSIS — D519 Vitamin B12 deficiency anemia, unspecified: Secondary | ICD-10-CM | POA: Diagnosis not present

## 2017-10-05 DIAGNOSIS — E785 Hyperlipidemia, unspecified: Secondary | ICD-10-CM | POA: Diagnosis not present

## 2017-10-05 DIAGNOSIS — I5032 Chronic diastolic (congestive) heart failure: Secondary | ICD-10-CM | POA: Diagnosis not present

## 2017-10-05 DIAGNOSIS — G6289 Other specified polyneuropathies: Secondary | ICD-10-CM | POA: Diagnosis not present

## 2017-10-12 ENCOUNTER — Encounter: Payer: Self-pay | Admitting: Podiatry

## 2017-10-12 ENCOUNTER — Telehealth: Payer: Self-pay | Admitting: *Deleted

## 2017-10-12 ENCOUNTER — Ambulatory Visit (INDEPENDENT_AMBULATORY_CARE_PROVIDER_SITE_OTHER): Payer: Medicare HMO

## 2017-10-12 ENCOUNTER — Ambulatory Visit (INDEPENDENT_AMBULATORY_CARE_PROVIDER_SITE_OTHER): Payer: Medicare HMO | Admitting: Podiatry

## 2017-10-12 ENCOUNTER — Other Ambulatory Visit: Payer: Self-pay | Admitting: Podiatry

## 2017-10-12 VITALS — BP 160/94 | HR 74 | Temp 99.4°F

## 2017-10-12 DIAGNOSIS — I96 Gangrene, not elsewhere classified: Secondary | ICD-10-CM | POA: Diagnosis not present

## 2017-10-12 DIAGNOSIS — L97509 Non-pressure chronic ulcer of other part of unspecified foot with unspecified severity: Secondary | ICD-10-CM

## 2017-10-12 DIAGNOSIS — E11621 Type 2 diabetes mellitus with foot ulcer: Secondary | ICD-10-CM

## 2017-10-12 DIAGNOSIS — E0842 Diabetes mellitus due to underlying condition with diabetic polyneuropathy: Secondary | ICD-10-CM

## 2017-10-12 DIAGNOSIS — Z89421 Acquired absence of other right toe(s): Secondary | ICD-10-CM

## 2017-10-12 DIAGNOSIS — M869 Osteomyelitis, unspecified: Secondary | ICD-10-CM

## 2017-10-12 DIAGNOSIS — E1169 Type 2 diabetes mellitus with other specified complication: Secondary | ICD-10-CM

## 2017-10-12 DIAGNOSIS — I70235 Atherosclerosis of native arteries of right leg with ulceration of other part of foot: Secondary | ICD-10-CM

## 2017-10-12 DIAGNOSIS — L97512 Non-pressure chronic ulcer of other part of right foot with fat layer exposed: Secondary | ICD-10-CM

## 2017-10-12 NOTE — Telephone Encounter (Signed)
-----   Message from Edrick Kins, DPM sent at 10/12/2017 10:57 AM EST ----- Regarding: Referral to vascular Please refer the patient back to vascular.  Go ahead and reorder arterial Dopplers right lower extremity. Note dictated.   Patient lives in [place]  Dx: New findings of ischemic gangrenous ulcerations right foot   Thanks, Dr. Amalia Hailey

## 2017-10-12 NOTE — Telephone Encounter (Signed)
Arterial Doppler faxed to [facility]. Referral, Clinicals and Demographics faxed to [place] fax.

## 2017-10-12 NOTE — Progress Notes (Signed)
Subjective:  Patient presents today status post partial first ray amputation right. DOS: 08/20/17.  Patient was last seen on 09/02/2017 and he missed his last appointment because he states he was in the hospital for GI issues.  Patient states that during the hospital inpatient stay his surgical amputation site had healed and sutures were removed at that time.  He states that while he was in the hospital they wrapped his foot tightly with Ace wraps and no padding underneath.  At that time he developed ischemic gangrenous ulcerations around the right midfoot dorsal and lateral.  Patient states that his since he is neuropathic he did not realize they were too tight.  The patient's family has been changing the dressings at home.  He presents today for follow-up treatment and evaluation   Past Medical History:  Diagnosis Date  . Chronic abdominal pain   . Esophagitis   . Eye hemorrhage   . Gastritis   . Gastroparesis   . GERD (gastroesophageal reflux disease)   . Glaucoma   . Helicobacter pylori (H. pylori) infection 2016   ERADICATION DOCUMENTED VIA STOOL TEST in AUG 2016 at [facility]  . Hiccups   . Hypertension   . Nausea and vomiting    chronic, recurrent  . Type 2 diabetes mellitus with complications (HCC)    diagnosed around age 47          Objective: Physical Exam General: The patient is alert and oriented x3 in no acute distress.  Dermatology: Skin is cool and dry bilateral lower extremities.  The surgical amputation stump to the right foot first ray appears well coapted and completely resolved with exception of a small area of dehiscence to the distal amputation site.  The small area of dehiscence measures approximately 0.5 x 0.2 x 0.1 cm (LxWxD).   New ischemic ulcerations noted to the dorsum and lateral aspect of the right foot secondary to tight dressing wraps.  Dorsomedial ulceration measures approximately 4.0 x 4.0 x 0.1 cm (LxWxD).  The lateral ulceration measures  approximately 9.0 x 3.0 x 0.1 cm (LxWxD).  To the noted ulcerations there is a well adhered eschar.  Negative for any significant drainage.  Negative for malodor.  Purulent integrity is intact.  Vascular: Diminished pedal pulses bilaterally. there is some moderate edema noted to the foot.   Neurological: Epicritic and protective threshold absent bilaterally.   Musculoskeletal Exam: Well-healing partial first ray amputation right foot  Assessment: 1. s/p partial first ray amputation right. DOS: 08/20/17 2.  Ischemic gangrenous ulcerations right foot 3.  Diabetes mellitus with neuropathy 4.  Peripheral vascular disease   Plan of Care:  1. Patient was evaluated.  Dry sterile dressings were applied today.  Dressings were applied very loosely.  I explained to the patient that they cannot wrap the dressings tightly.  I informed him that this is strictly a vascular issue and vascular compromise was likely exacerbated with the type dressings. 2.  Recommend home health dressing changes however the patient states that insurance will not pay for it.  Recommend daily application of Betadine and dry sterile dressing. 3.  Today we are going to refer the patient back to vascular for vascular workup due to the large ischemic eschar areas to the right foot. 4.  Continue weightbearing in the immobilization cam boot 5.  Return to clinic in 3 weeks  Edrick Kins, DPM [facility]  Dr. Edrick Kins, DPM    [address]  [place], [place] [address]                Office [phone]  Fax [phone]

## 2017-10-16 DIAGNOSIS — I10 Essential (primary) hypertension: Secondary | ICD-10-CM | POA: Diagnosis not present

## 2017-10-16 DIAGNOSIS — E785 Hyperlipidemia, unspecified: Secondary | ICD-10-CM | POA: Diagnosis not present

## 2017-10-16 DIAGNOSIS — I5032 Chronic diastolic (congestive) heart failure: Secondary | ICD-10-CM | POA: Diagnosis not present

## 2017-10-16 DIAGNOSIS — M869 Osteomyelitis, unspecified: Secondary | ICD-10-CM | POA: Diagnosis not present

## 2017-10-16 DIAGNOSIS — E1143 Type 2 diabetes mellitus with diabetic autonomic (poly)neuropathy: Secondary | ICD-10-CM | POA: Diagnosis not present

## 2017-10-16 DIAGNOSIS — G6289 Other specified polyneuropathies: Secondary | ICD-10-CM | POA: Diagnosis not present

## 2017-10-16 DIAGNOSIS — I739 Peripheral vascular disease, unspecified: Secondary | ICD-10-CM | POA: Diagnosis not present

## 2017-10-16 DIAGNOSIS — E782 Mixed hyperlipidemia: Secondary | ICD-10-CM | POA: Diagnosis not present

## 2017-10-19 ENCOUNTER — Other Ambulatory Visit: Payer: Self-pay | Admitting: Gastroenterology

## 2017-10-26 DIAGNOSIS — Z6841 Body Mass Index (BMI) 40.0 and over, adult: Secondary | ICD-10-CM | POA: Diagnosis not present

## 2017-10-26 DIAGNOSIS — I10 Essential (primary) hypertension: Secondary | ICD-10-CM | POA: Diagnosis not present

## 2017-10-26 DIAGNOSIS — K5901 Slow transit constipation: Secondary | ICD-10-CM | POA: Diagnosis not present

## 2017-10-30 ENCOUNTER — Emergency Department (HOSPITAL_COMMUNITY)
Admission: EM | Admit: 2017-10-30 | Discharge: 2017-10-30 | Disposition: A | Discharge status: Home / Self Care | Payer: Medicare HMO

## 2017-10-30 ENCOUNTER — Emergency Department (HOSPITAL_COMMUNITY): Payer: Medicare HMO

## 2017-10-30 ENCOUNTER — Inpatient Hospital Stay (HOSPITAL_COMMUNITY)
Admission: EM | Admit: 2017-10-30 | Discharge: 2017-11-06 | DRG: 871 | Disposition: A | Discharge status: Home Health | Payer: Medicare HMO | Attending: Family Medicine | Admitting: Family Medicine

## 2017-10-30 ENCOUNTER — Encounter (HOSPITAL_COMMUNITY): Payer: Self-pay

## 2017-10-30 ENCOUNTER — Inpatient Hospital Stay (HOSPITAL_COMMUNITY): Payer: Medicare HMO

## 2017-10-30 DIAGNOSIS — E1165 Type 2 diabetes mellitus with hyperglycemia: Secondary | ICD-10-CM | POA: Diagnosis not present

## 2017-10-30 DIAGNOSIS — E46 Unspecified protein-calorie malnutrition: Secondary | ICD-10-CM | POA: Diagnosis not present

## 2017-10-30 DIAGNOSIS — Z882 Allergy status to sulfonamides status: Secondary | ICD-10-CM | POA: Diagnosis not present

## 2017-10-30 DIAGNOSIS — R509 Fever, unspecified: Secondary | ICD-10-CM | POA: Diagnosis not present

## 2017-10-30 DIAGNOSIS — M86671 Other chronic osteomyelitis, right ankle and foot: Secondary | ICD-10-CM | POA: Diagnosis not present

## 2017-10-30 DIAGNOSIS — R451 Restlessness and agitation: Secondary | ICD-10-CM | POA: Diagnosis present

## 2017-10-30 DIAGNOSIS — R1011 Right upper quadrant pain: Secondary | ICD-10-CM | POA: Diagnosis not present

## 2017-10-30 DIAGNOSIS — Z6841 Body Mass Index (BMI) 40.0 and over, adult: Secondary | ICD-10-CM

## 2017-10-30 DIAGNOSIS — L97409 Non-pressure chronic ulcer of unspecified heel and midfoot with unspecified severity: Secondary | ICD-10-CM | POA: Diagnosis not present

## 2017-10-30 DIAGNOSIS — M25511 Pain in right shoulder: Secondary | ICD-10-CM | POA: Diagnosis not present

## 2017-10-30 DIAGNOSIS — A419 Sepsis, unspecified organism: Principal | ICD-10-CM

## 2017-10-30 DIAGNOSIS — R079 Chest pain, unspecified: Secondary | ICD-10-CM | POA: Diagnosis not present

## 2017-10-30 DIAGNOSIS — E1122 Type 2 diabetes mellitus with diabetic chronic kidney disease: Secondary | ICD-10-CM | POA: Diagnosis not present

## 2017-10-30 DIAGNOSIS — E118 Type 2 diabetes mellitus with unspecified complications: Secondary | ICD-10-CM | POA: Diagnosis not present

## 2017-10-30 DIAGNOSIS — N183 Chronic kidney disease, stage 3 unspecified: Secondary | ICD-10-CM | POA: Diagnosis present

## 2017-10-30 DIAGNOSIS — Z781 Physical restraint status: Secondary | ICD-10-CM

## 2017-10-30 DIAGNOSIS — L97519 Non-pressure chronic ulcer of other part of right foot with unspecified severity: Secondary | ICD-10-CM

## 2017-10-30 DIAGNOSIS — D631 Anemia in chronic kidney disease: Secondary | ICD-10-CM | POA: Diagnosis present

## 2017-10-30 DIAGNOSIS — K3184 Gastroparesis: Secondary | ICD-10-CM | POA: Diagnosis present

## 2017-10-30 DIAGNOSIS — I5033 Acute on chronic diastolic (congestive) heart failure: Secondary | ICD-10-CM | POA: Diagnosis not present

## 2017-10-30 DIAGNOSIS — E1143 Type 2 diabetes mellitus with diabetic autonomic (poly)neuropathy: Secondary | ICD-10-CM | POA: Diagnosis present

## 2017-10-30 DIAGNOSIS — T783XXA Angioneurotic edema, initial encounter: Secondary | ICD-10-CM | POA: Diagnosis present

## 2017-10-30 DIAGNOSIS — E876 Hypokalemia: Secondary | ICD-10-CM | POA: Diagnosis present

## 2017-10-30 DIAGNOSIS — M79601 Pain in right arm: Secondary | ICD-10-CM | POA: Diagnosis not present

## 2017-10-30 DIAGNOSIS — Z89411 Acquired absence of right great toe: Secondary | ICD-10-CM

## 2017-10-30 DIAGNOSIS — E1142 Type 2 diabetes mellitus with diabetic polyneuropathy: Secondary | ICD-10-CM | POA: Diagnosis present

## 2017-10-30 DIAGNOSIS — K210 Gastro-esophageal reflux disease with esophagitis: Secondary | ICD-10-CM | POA: Diagnosis present

## 2017-10-30 DIAGNOSIS — I7090 Unspecified atherosclerosis: Secondary | ICD-10-CM | POA: Diagnosis not present

## 2017-10-30 DIAGNOSIS — E1169 Type 2 diabetes mellitus with other specified complication: Secondary | ICD-10-CM | POA: Diagnosis present

## 2017-10-30 DIAGNOSIS — E11628 Type 2 diabetes mellitus with other skin complications: Secondary | ICD-10-CM | POA: Diagnosis present

## 2017-10-30 DIAGNOSIS — K589 Irritable bowel syndrome without diarrhea: Secondary | ICD-10-CM | POA: Diagnosis present

## 2017-10-30 DIAGNOSIS — L089 Local infection of the skin and subcutaneous tissue, unspecified: Secondary | ICD-10-CM

## 2017-10-30 DIAGNOSIS — T464X5A Adverse effect of angiotensin-converting-enzyme inhibitors, initial encounter: Secondary | ICD-10-CM | POA: Diagnosis present

## 2017-10-30 DIAGNOSIS — Z8249 Family history of ischemic heart disease and other diseases of the circulatory system: Secondary | ICD-10-CM

## 2017-10-30 DIAGNOSIS — M79671 Pain in right foot: Secondary | ICD-10-CM | POA: Diagnosis not present

## 2017-10-30 DIAGNOSIS — Z794 Long term (current) use of insulin: Secondary | ICD-10-CM

## 2017-10-30 DIAGNOSIS — L97429 Non-pressure chronic ulcer of left heel and midfoot with unspecified severity: Secondary | ICD-10-CM | POA: Diagnosis present

## 2017-10-30 DIAGNOSIS — H409 Unspecified glaucoma: Secondary | ICD-10-CM | POA: Diagnosis present

## 2017-10-30 DIAGNOSIS — R234 Changes in skin texture: Secondary | ICD-10-CM | POA: Diagnosis present

## 2017-10-30 DIAGNOSIS — Z79899 Other long term (current) drug therapy: Secondary | ICD-10-CM

## 2017-10-30 DIAGNOSIS — I96 Gangrene, not elsewhere classified: Secondary | ICD-10-CM | POA: Diagnosis not present

## 2017-10-30 DIAGNOSIS — L97419 Non-pressure chronic ulcer of right heel and midfoot with unspecified severity: Secondary | ICD-10-CM | POA: Diagnosis not present

## 2017-10-30 DIAGNOSIS — Z833 Family history of diabetes mellitus: Secondary | ICD-10-CM

## 2017-10-30 DIAGNOSIS — E1152 Type 2 diabetes mellitus with diabetic peripheral angiopathy with gangrene: Secondary | ICD-10-CM | POA: Diagnosis not present

## 2017-10-30 DIAGNOSIS — E11621 Type 2 diabetes mellitus with foot ulcer: Secondary | ICD-10-CM | POA: Diagnosis not present

## 2017-10-30 DIAGNOSIS — T445X5A Adverse effect of predominantly beta-adrenoreceptor agonists, initial encounter: Secondary | ICD-10-CM | POA: Diagnosis present

## 2017-10-30 DIAGNOSIS — I70234 Atherosclerosis of native arteries of right leg with ulceration of heel and midfoot: Secondary | ICD-10-CM | POA: Diagnosis not present

## 2017-10-30 DIAGNOSIS — L03115 Cellulitis of right lower limb: Secondary | ICD-10-CM | POA: Diagnosis present

## 2017-10-30 DIAGNOSIS — I130 Hypertensive heart and chronic kidney disease with heart failure and stage 1 through stage 4 chronic kidney disease, or unspecified chronic kidney disease: Secondary | ICD-10-CM | POA: Diagnosis present

## 2017-10-30 DIAGNOSIS — I10 Essential (primary) hypertension: Secondary | ICD-10-CM | POA: Diagnosis present

## 2017-10-30 DIAGNOSIS — Z886 Allergy status to analgesic agent status: Secondary | ICD-10-CM | POA: Diagnosis not present

## 2017-10-30 DIAGNOSIS — Z888 Allergy status to other drugs, medicaments and biological substances status: Secondary | ICD-10-CM | POA: Diagnosis not present

## 2017-10-30 DIAGNOSIS — Z89421 Acquired absence of other right toe(s): Secondary | ICD-10-CM | POA: Diagnosis not present

## 2017-10-30 DIAGNOSIS — N186 End stage renal disease: Secondary | ICD-10-CM | POA: Diagnosis present

## 2017-10-30 LAB — CBC WITH DIFFERENTIAL/PLATELET
Basophils Absolute: 0 10*3/uL (ref 0.0–0.1)
Basophils Relative: 0 %
Eosinophils Absolute: 0 10*3/uL (ref 0.0–0.7)
Eosinophils Relative: 0 %
HCT: 27.3 % — ABNORMAL LOW (ref 39.0–52.0)
Hemoglobin: 9.4 g/dL — ABNORMAL LOW (ref 13.0–17.0)
Lymphocytes Relative: 12 %
Lymphs Abs: 1.1 10*3/uL (ref 0.7–4.0)
MCH: 30.5 pg (ref 26.0–34.0)
MCHC: 34.4 g/dL (ref 30.0–36.0)
MCV: 88.6 fL (ref 78.0–100.0)
Monocytes Absolute: 0.5 10*3/uL (ref 0.1–1.0)
Monocytes Relative: 6 %
Neutro Abs: 7.6 10*3/uL (ref 1.7–7.7)
Neutrophils Relative %: 82 %
Platelets: 169 10*3/uL (ref 150–400)
RBC: 3.08 MIL/uL — ABNORMAL LOW (ref 4.22–5.81)
RDW: 13.4 % (ref 11.5–15.5)
WBC: 9.3 10*3/uL (ref 4.0–10.5)

## 2017-10-30 LAB — COMPREHENSIVE METABOLIC PANEL
ALT: 10 U/L — ABNORMAL LOW (ref 17–63)
AST: 25 U/L (ref 15–41)
Albumin: 2.7 g/dL — ABNORMAL LOW (ref 3.5–5.0)
Alkaline Phosphatase: 59 U/L (ref 38–126)
Anion gap: 12 (ref 5–15)
BUN: 8 mg/dL (ref 6–20)
CO2: 25 mmol/L (ref 22–32)
Calcium: 8.5 mg/dL — ABNORMAL LOW (ref 8.9–10.3)
Chloride: 105 mmol/L (ref 101–111)
Creatinine, Ser: 1.51 mg/dL — ABNORMAL HIGH (ref 0.61–1.24)
GFR calc Af Amer: >60 mL/min (ref >60)
GFR calc non Af Amer: 53 mL/min — ABNORMAL LOW (ref >60)
Glucose, Bld: 159 mg/dL — ABNORMAL HIGH (ref 65–99)
Potassium: 2.7 mmol/L — CL (ref 3.5–5.1)
Sodium: 142 mmol/L (ref 135–145)
Total Bilirubin: 0.8 mg/dL (ref 0.3–1.2)
Total Protein: 6 g/dL — ABNORMAL LOW (ref 6.5–8.1)

## 2017-10-30 LAB — I-STAT TROPONIN, ED: Troponin i, poc: 0.01 ng/mL (ref 0.00–0.08)

## 2017-10-30 LAB — PROTIME-INR
INR: 1.25
Prothrombin Time: 15.6 seconds — ABNORMAL HIGH (ref 11.4–15.2)

## 2017-10-30 LAB — I-STAT CG4 LACTIC ACID, ED: Lactic Acid, Venous: 4.12 mmol/L (ref 0.5–1.9)

## 2017-10-30 LAB — CBG MONITORING, ED: Glucose-Capillary: 150 mg/dL — ABNORMAL HIGH (ref 65–99)

## 2017-10-30 MED ORDER — LORAZEPAM 2 MG/ML IJ SOLN
1.0000 mg | Freq: Once | INTRAMUSCULAR | Status: AC
  Administered 2017-10-30: 1 mg via INTRAVENOUS
  Filled 2017-10-30: qty 1

## 2017-10-30 MED ORDER — DICYCLOMINE HCL 10 MG PO CAPS
10.0000 mg | ORAL_CAPSULE | Freq: Three times a day (TID) | ORAL | Status: DC
  Administered 2017-10-31 – 2017-11-06 (×24): 10 mg via ORAL
  Filled 2017-10-30 (×24): qty 1

## 2017-10-30 MED ORDER — SODIUM CHLORIDE 0.9 % IV BOLUS (SEPSIS)
1000.0000 mL | Freq: Once | INTRAVENOUS | Status: AC
Start: 2017-10-30 — End: 2017-10-30
  Administered 2017-10-30: 1000 mL via INTRAVENOUS

## 2017-10-30 MED ORDER — FOLIC ACID 1 MG PO TABS
1.0000 mg | ORAL_TABLET | Freq: Every day | ORAL | Status: DC
  Administered 2017-10-31 – 2017-11-06 (×7): 1 mg via ORAL
  Filled 2017-10-30 (×8): qty 1

## 2017-10-30 MED ORDER — METOPROLOL SUCCINATE ER 25 MG PO TB24
25.0000 mg | ORAL_TABLET | Freq: Every day | ORAL | Status: DC
  Administered 2017-10-31 – 2017-11-06 (×7): 25 mg via ORAL
  Filled 2017-10-30 (×7): qty 1

## 2017-10-30 MED ORDER — HYDRALAZINE HCL 25 MG PO TABS
25.0000 mg | ORAL_TABLET | Freq: Three times a day (TID) | ORAL | Status: DC
  Administered 2017-10-31 (×2): 25 mg via ORAL
  Filled 2017-10-30 (×2): qty 1

## 2017-10-30 MED ORDER — HYDROMORPHONE HCL 1 MG/ML IJ SOLN
0.5000 mg | INTRAMUSCULAR | Status: DC | PRN
  Administered 2017-10-31 – 2017-11-05 (×11): 1 mg via INTRAVENOUS
  Filled 2017-10-30 (×11): qty 1

## 2017-10-30 MED ORDER — LISINOPRIL 5 MG PO TABS: 5.0000 mg | ORAL_TABLET | Freq: Every day | ORAL | Status: DC

## 2017-10-30 MED ORDER — SODIUM CHLORIDE 0.9 % IV SOLN
INTRAVENOUS | Status: DC
  Administered 2017-10-31: 02:00:00 via INTRAVENOUS

## 2017-10-30 MED ORDER — POTASSIUM CHLORIDE 10 MEQ/100ML IV SOLN
10.0000 meq | INTRAVENOUS | Status: AC
  Administered 2017-10-31 (×5): 10 meq via INTRAVENOUS
  Filled 2017-10-30 (×3): qty 100

## 2017-10-30 MED ORDER — ONDANSETRON HCL 4 MG PO TABS: 4.0000 mg | ORAL_TABLET | Freq: Four times a day (QID) | ORAL | Status: DC | PRN

## 2017-10-30 MED ORDER — INSULIN GLARGINE 100 UNIT/ML ~~LOC~~ SOLN
15.0000 [IU] | Freq: Two times a day (BID) | SUBCUTANEOUS | Status: DC
  Administered 2017-10-31 – 2017-11-06 (×14): 15 [IU] via SUBCUTANEOUS
  Filled 2017-10-30 (×14): qty 0.15

## 2017-10-30 MED ORDER — SUCRALFATE 1 G PO TABS
1.0000 g | ORAL_TABLET | Freq: Four times a day (QID) | ORAL | Status: DC
  Administered 2017-10-31 – 2017-11-06 (×25): 1 g via ORAL
  Filled 2017-10-30 (×27): qty 1

## 2017-10-30 MED ORDER — VANCOMYCIN HCL IN DEXTROSE 1-5 GM/200ML-% IV SOLN
1000.0000 mg | Freq: Two times a day (BID) | INTRAVENOUS | Status: DC
  Administered 2017-10-31: 1000 mg via INTRAVENOUS
  Filled 2017-10-30: qty 200

## 2017-10-30 MED ORDER — VITAMIN B-1 100 MG PO TABS
100.0000 mg | ORAL_TABLET | Freq: Every day | ORAL | Status: DC
  Administered 2017-10-31 – 2017-11-06 (×7): 100 mg via ORAL
  Filled 2017-10-30 (×7): qty 1

## 2017-10-30 MED ORDER — ACETAMINOPHEN 325 MG PO TABS
650.0000 mg | ORAL_TABLET | Freq: Four times a day (QID) | ORAL | Status: DC | PRN
  Administered 2017-11-05 – 2017-11-06 (×2): 650 mg via ORAL
  Filled 2017-10-30 (×2): qty 2

## 2017-10-30 MED ORDER — TIMOLOL MALEATE 0.5 % OP SOLN
1.0000 [drp] | Freq: Two times a day (BID) | OPHTHALMIC | Status: DC
  Administered 2017-10-31 – 2017-11-06 (×13): 1 [drp] via OPHTHALMIC
  Filled 2017-10-30 (×3): qty 5

## 2017-10-30 MED ORDER — INSULIN ASPART 100 UNIT/ML ~~LOC~~ SOLN
0.0000 [IU] | Freq: Three times a day (TID) | SUBCUTANEOUS | Status: DC
  Administered 2017-11-03 – 2017-11-05 (×2): 2 [IU] via SUBCUTANEOUS

## 2017-10-30 MED ORDER — SODIUM CHLORIDE 0.9 % IV SOLN
2.0000 g | Freq: Once | INTRAVENOUS | Status: AC
  Administered 2017-10-30: 2 g via INTRAVENOUS
  Filled 2017-10-30: qty 2

## 2017-10-30 MED ORDER — METRONIDAZOLE IN NACL 5-0.79 MG/ML-% IV SOLN
500.0000 mg | Freq: Three times a day (TID) | INTRAVENOUS | Status: DC
  Administered 2017-10-31 – 2017-11-04 (×13): 500 mg via INTRAVENOUS
  Filled 2017-10-30 (×14): qty 100

## 2017-10-30 MED ORDER — SODIUM CHLORIDE 0.9 % IV BOLUS (SEPSIS)
1000.0000 mL | Freq: Once | INTRAVENOUS | Status: AC
  Administered 2017-10-30: 1000 mL via INTRAVENOUS

## 2017-10-30 MED ORDER — SODIUM CHLORIDE 0.9 % IV BOLUS (SEPSIS)
500.0000 mL | Freq: Once | INTRAVENOUS | Status: AC
  Administered 2017-10-30: 500 mL via INTRAVENOUS

## 2017-10-30 MED ORDER — VANCOMYCIN HCL 10 G IV SOLR
1500.0000 mg | Freq: Once | INTRAVENOUS | Status: AC
  Administered 2017-10-30: 1500 mg via INTRAVENOUS
  Filled 2017-10-30: qty 1500

## 2017-10-30 MED ORDER — METRONIDAZOLE IN NACL 5-0.79 MG/ML-% IV SOLN
500.0000 mg | Freq: Once | INTRAVENOUS | Status: AC
Start: 2017-10-30 — End: 2017-10-30
  Administered 2017-10-30: 500 mg via INTRAVENOUS
  Filled 2017-10-30: qty 100

## 2017-10-30 MED ORDER — ACETAMINOPHEN 650 MG RE SUPP: 650.0000 mg | Freq: Four times a day (QID) | RECTAL | Status: DC | PRN

## 2017-10-30 MED ORDER — ACETAMINOPHEN 500 MG PO TABS
1000.0000 mg | ORAL_TABLET | Freq: Once | ORAL | Status: AC
  Administered 2017-10-30: 1000 mg via ORAL
  Filled 2017-10-30: qty 2

## 2017-10-30 MED ORDER — ONDANSETRON HCL 4 MG/2ML IJ SOLN
4.0000 mg | Freq: Four times a day (QID) | INTRAMUSCULAR | Status: DC | PRN
  Administered 2017-11-03 – 2017-11-04 (×2): 4 mg via INTRAVENOUS
  Filled 2017-10-30 (×2): qty 2

## 2017-10-30 MED ORDER — SODIUM CHLORIDE 0.9 % IV SOLN
2.0000 g | Freq: Three times a day (TID) | INTRAVENOUS | Status: DC
  Filled 2017-10-30: qty 2

## 2017-10-30 MED ORDER — HEPARIN SODIUM (PORCINE) 5000 UNIT/ML IJ SOLN
5000.0000 [IU] | Freq: Three times a day (TID) | INTRAMUSCULAR | Status: DC
  Administered 2017-10-31 – 2017-11-06 (×19): 5000 [IU] via SUBCUTANEOUS
  Filled 2017-10-30 (×18): qty 1

## 2017-10-30 MED ORDER — POTASSIUM CHLORIDE CRYS ER 20 MEQ PO TBCR
40.0000 meq | EXTENDED_RELEASE_TABLET | Freq: Once | ORAL | Status: AC
  Administered 2017-10-31: 40 meq via ORAL
  Filled 2017-10-30: qty 2

## 2017-10-30 MED ORDER — METOCLOPRAMIDE HCL 5 MG/ML IJ SOLN
10.0000 mg | Freq: Four times a day (QID) | INTRAMUSCULAR | Status: DC
  Administered 2017-10-31 – 2017-11-01 (×6): 10 mg via INTRAVENOUS
  Filled 2017-10-30 (×6): qty 2

## 2017-10-30 MED ORDER — METRONIDAZOLE IN NACL 5-0.79 MG/ML-% IV SOLN: 500.0000 mg | Freq: Three times a day (TID) | INTRAVENOUS | Status: DC

## 2017-10-30 MED ORDER — SODIUM CHLORIDE 0.9 % IV BOLUS (SEPSIS): 1000.0000 mL | Freq: Once | INTRAVENOUS | Status: DC

## 2017-10-30 MED ORDER — VANCOMYCIN HCL IN DEXTROSE 1-5 GM/200ML-% IV SOLN
1000.0000 mg | Freq: Once | INTRAVENOUS | Status: DC
  Filled 2017-10-30: qty 200

## 2017-10-30 MED ORDER — AZTREONAM IN DEXTROSE 2 GM/50ML IV SOLN
2.0000 g | Freq: Three times a day (TID) | INTRAVENOUS | Status: DC
  Filled 2017-10-30 (×3): qty 50

## 2017-10-30 MED ORDER — PANTOPRAZOLE SODIUM 40 MG PO TBEC
40.0000 mg | DELAYED_RELEASE_TABLET | Freq: Two times a day (BID) | ORAL | Status: DC
  Administered 2017-10-31 – 2017-11-06 (×13): 40 mg via ORAL
  Filled 2017-10-30 (×13): qty 1

## 2017-10-30 MED ORDER — ATORVASTATIN CALCIUM 10 MG PO TABS
10.0000 mg | ORAL_TABLET | Freq: Every evening | ORAL | Status: DC
  Administered 2017-10-31 – 2017-11-05 (×6): 10 mg via ORAL
  Filled 2017-10-30 (×6): qty 1

## 2017-10-30 NOTE — ED Notes (Signed)
Pt comes by POV with family, they say he complains of chest pain, abdominal pain, and tongue swelling Family reports that he passed out at home a few times

## 2017-10-30 NOTE — ED Notes (Signed)
Pt is legally blind. 

## 2017-10-30 NOTE — Progress Notes (Signed)
A consult was received from an ED physician for Vancomycin, Aztreonam, and Flagyl per pharmacy dosing (for an indication other than meningitis). The patient's profile has been reviewed for ht/wt/allergies/indication/available labs. A one time order has been placed for the above antibiotics.  Further antibiotics/pharmacy consults should be ordered by admitting physician if indicated.                       Reuel Boom, PharmD, BCPS [phone] 10/30/2017, 8:42 PM

## 2017-10-30 NOTE — Progress Notes (Signed)
Pharmacy Antibiotic Note  Timothy Clark is a 47 y.o. male admitted on 10/30/2017 with sepsis.  Pharmacy has been consulted for azactam and vancomycin dosing.  Plan: Azactam 2 Gm IV q8h Vancomycin 1500 mg x1 then 1 Gm IV q12h for est AUC = 504 Goal AUC =400-500 F/u scr/cultures  Height: 6' (182.9 cm) Weight: (!) 317 lb (143.8 kg) IBW/kg (Calculated) : 77.6  Temp (24hrs), Avg:101.5 F (38.6 C), Min:101.5 F (38.6 C), Max:101.5 F (38.6 C)  Recent Labs  Lab 10/30/17 2020 10/30/17 2031  WBC 9.3  --   CREATININE 1.51*  --   LATICACIDVEN  --  4.12*    Estimated Creatinine Clearance: 89 mL/min (A) (by C-G formula based on SCr of 1.51 mg/dL (H)).    Allergies  Allergen Reactions  . Donnatal [Pb-Hyoscy-Atropine-Scopol Er] Anaphylaxis    Reaction to GI cocktail  . Fish Allergy Anaphylaxis, Hives and Rash  . Haldol [Haloperidol Lactate] Other (See Comments)    Chest Pain  . Lidocaine Anaphylaxis    Reaction to GI cocktail  . Maalox [Calcium Carbonate Antacid] Anaphylaxis    Reaction to GI cocktail  . Aspirin Hives and Itching  . Bactrim [Sulfamethoxazole-Trimethoprim] Itching  . Keflex [Cephalexin] Hives, Itching and Nausea And Vomiting  . Penicillins Hives and Itching    Has patient had a PCN reaction causing immediate rash, facial/tongue/throat swelling, SOB or lightheadedness with hypotension: yes Has patient had a PCN reaction causing severe rash involving mucus membranes or skin necrosis: yes Has patient had a PCN reaction that required hospitalization no Has patient had a PCN reaction occurring within the last 10 years: yes If all of the above answers are "NO", then may proceed with Cephalospor  . Phenergan [Promethazine Hcl] Nausea And Vomiting    Antimicrobials this admission: 3/22 azactam >>  3/22 flagyl >>  3/22 vancomycin >>  Dose adjustments this admission:   Microbiology results:  BCx:   UCx:    Sputum:    MRSA PCR:   Thank you for allowing  pharmacy to be a part of this patient's care.  Dorrene German 10/30/2017 10:13 PM

## 2017-10-30 NOTE — ED Notes (Signed)
Pt Sister took all belongings home with her.

## 2017-10-30 NOTE — ED Notes (Signed)
Please keep Timothy Clark [phone] updated.

## 2017-10-30 NOTE — ED Notes (Signed)
Pt has a condom cath in place

## 2017-10-30 NOTE — ED Provider Notes (Signed)
Emergency Department Provider Note   I have reviewed the triage vital signs and the nursing notes.   HISTORY  Chief Complaint Chest Pain   HPI Timothy Clark is a 47 y.o. male with PMH of DM, HTN, GERD, Gastroparesis, and blindness presents to the emergency department with difficulty breathing, abdominal pain, chest pain, fever.  Patient was driven to the emergency department by family who were concerned that he was not breathing well complaining of abdominal pain.  He has known right foot wound and states the dressing was last changed 3 days ago by his sister.  He is scheduled to follow-up with the wound care center in 1 week.  He currently states his abdominal pain is in the right upper quadrant and reports that his chest pain is mild.  No radiation of symptoms or modifying factors.  No vomiting or diarrhea.    Past Medical History:  Diagnosis Date  . Chronic abdominal pain   . Esophagitis   . Eye hemorrhage   . Gastritis   . Gastroparesis   . GERD (gastroesophageal reflux disease)   . Glaucoma   . Helicobacter pylori (H. pylori) infection 2016   ERADICATION DOCUMENTED VIA STOOL TEST in AUG 2016 at [facility]  . Hiccups   . Hypertension   . Nausea and vomiting    chronic, recurrent  . Type 2 diabetes mellitus with complications (HCC)    diagnosed around age 53    Patient Active Problem List   Diagnosis Date Noted  . Sepsis ([place]) 10/30/2017  . CKD stage 3 due to type 2 diabetes mellitus ([place]) 10/30/2017  . Gangrene of right foot ([place]) 10/30/2017  . ACE inhibitor-aggravated angioedema, initial encounter 10/30/2017  . Right shoulder pain 10/30/2017  . Restlessness and agitation 10/30/2017  . Acute kidney injury ([place]) 09/21/2017  . Chronic abdominal pain 09/21/2017  . Suicidal ideation 09/21/2017  . Hypertensive urgency 09/21/2017  . Prolonged QT interval 09/21/2017  . Diabetic foot infection ([place]) 07/15/2017  . Sepsis due to cellulitis ([place]) 07/14/2017  . GERD  (gastroesophageal reflux disease) 07/14/2017  . Glaucoma 07/14/2017  . Anemia 07/14/2017  . Intractable nausea and vomiting 06/10/2017  . Pressure injury of skin 02/01/2017  . Psychosis ([place]) 01/31/2017  . Hypokalemia 01/31/2017  . AKI (acute kidney injury) ([place]) 09/07/2016  . Insulin dependent diabetes mellitus ([place]) 09/07/2016  . Orthostatic syncope 09/05/2016  . Syncope and collapse   . 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 Diarrhea 09/27/2015  . Gastroparesis due to DM ([place]) 02/06/2015  . Intractable vomiting 01/15/2015  . Gastritis   . Essential hypertension 12/06/2014  . H. pylori infection 11/27/2014  . Mucosal abnormality of stomach   . Mucosal abnormality of esophagus   . Nausea vomiting and diarrhea 10/25/2014  . DIABETIC FOOT ULCER with Osteomyelitis 02/14/2010  . LIMB PAIN 12/11/2009  . UNSPECIFIED PERIPHERAL VASCULAR DISEASE 10/19/2009  . ERECTILE DYSFUNCTION, ORGANIC 10/19/2009  . NUMBNESS 07/20/2009  . Diabetes mellitus type 2 with complications, uncontrolled ([place]) 03/22/2009  . Hyperlipidemia 03/22/2009  . Depression 03/22/2009    Past Surgical History:  Procedure Laterality Date  . CATARACT EXTRACTION W/PHACO Left 05/19/2016   Procedure: CATARACT EXTRACTION PHACO AND INTRAOCULAR LENS PLACEMENT LEFT EYE;  Surgeon: Tonny Branch, MD;  Location: [facility];  Service: Ophthalmology;  Laterality: Left;  CDE: 7.30  . CATARACT EXTRACTION W/PHACO Right 06/23/2016   Procedure: CATARACT EXTRACTION PHACO AND INTRAOCULAR LENS PLACEMENT RIGHT EYE CDE=9.87;  Surgeon: Tonny Branch, MD;  Location: [facility];  Service:  Ophthalmology;  Laterality: Right;  right  . ESOPHAGOGASTRODUODENOSCOPY  2015   Dr. Britta Mccreedy  . ESOPHAGOGASTRODUODENOSCOPY N/A 10/26/2014   RMR: Distal esophagititis-likely reflux related although an element of pill induced injuruy no exclueded  status post biopsy. Diffusely abnormal gastric mucosa of uncertain signigicane -status post gastric biopsy. Focal area of excoriation in the  cardia most consistant with a trauma of heaving.   . ESOPHAGOGASTRODUODENOSCOPY  08/2015   [facility]: mild esophagitis and old gastric contents  . EYE SURGERY  2015   stent placed to left eye  . EYE SURGERY    . INCISION AND DRAINAGE OF WOUND Right 07/16/2017   Procedure: IRRIGATION AND DEBRIDEMENT OF SOFT TISSUE OF ULCERATION RIGHT FOOT;  Surgeon: Caprice Beaver, DPM;  Location: [facility];  Service: Podiatry;  Laterality: Right;    Current Outpatient Rx  . Order #: [id number] Class: Historical Med  . Order #: [id number] Class: Historical Med  . Order #: [id number] Class: Normal  . Order #: [id number] Class: Normal  . Order #: [id number] Class: Normal  . Order #: [id number] Class: Normal  . Order #: [id number] Class: Normal  . Order #: [id number] Class: Historical Med  . Order #: [id number] Class: Historical Med  . Order #: [id number] Class: Normal  . Order #: [id number] Class: Historical Med  . Order #: [id number] Class: Normal  . Order #: [id number] Class: Historical Med  . Order #: [id number] Class: OTC  . Order #: [id number] Class: OTC  . Order #: [id number] Class: OTC    Allergies Donnatal [pb-hyoscy-atropine-scopol er]; Fish allergy; Haldol [haloperidol lactate]; Lidocaine; Maalox [calcium carbonate antacid]; Aspirin; Bactrim [sulfamethoxazole-trimethoprim]; Keflex [cephalexin]; Lisinopril; Penicillins; and Phenergan [promethazine hcl]  Family History  Problem Relation Age of Onset  . Ovarian cancer Mother   . Heart attack Father   . Cervical cancer Sister   . Diabetes Sister   . Colon cancer Neg Hx     Social History Social History   Tobacco Use  . Smoking status: Never Smoker  . Smokeless tobacco: Never Used  Substance Use Topics  . Alcohol use: No    Alcohol/week: 0.0 oz  . Drug use: No    Review of Systems  Constitutional: Positive fever/chills Eyes: No visual changes. ENT: No sore throat. Cardiovascular: Positive chest pain. Respiratory: Denies shortness of  breath. Gastrointestinal: Positive RUQ abdominal pain. No nausea, no vomiting. No diarrhea. No constipation. Genitourinary: Negative for dysuria. Musculoskeletal: Negative for back pain. Skin: Negative for rash. Neurological: Negative for headaches, focal weakness or numbness.  10-point ROS otherwise negative.  ____________________________________________   PHYSICAL EXAM:  VITAL SIGNS: ED Triage Vitals  Enc Vitals Group     BP 10/30/17 2005 (!) 176/89     Pulse Rate 10/30/17 2005 91     Resp 10/30/17 2005 (!) 22     Temp 10/30/17 2005 (!) 101.5 F (38.6 C)     Temp Source 10/30/17 2005 Rectal     SpO2 10/30/17 2005 100 %     Weight 10/30/17 2008 (!) 317 lb (143.8 kg)     Height 10/30/17 2008 6' (1.829 m)   Constitutional: Alert but agitated. Able to provide history with frequent redirection.  Eyes: Conjunctivae are normal.  Head: Atraumatic. Nose: No congestion/rhinnorhea. Mouth/Throat: Mucous membranes are moist.  Oropharynx non-erythematous. Neck: No stridor.   Cardiovascular: Normal rate, regular rhythm. Good peripheral circulation. Grossly normal heart sounds.   Respiratory: Increased respiratory effort.  No retractions. Lungs CTAB. Gastrointestinal: Soft with mild RUQ tenderness. No rebound or guarding. No distention.  Musculoskeletal: No lower extremity tenderness nor edema. No gross deformities  of extremities. Neurologic:  Normal speech and language. No gross focal neurologic deficits are appreciated.  Skin:  Skin is warm and dry. Right foot is more swollen with mild cellulitis and warmth with foul smelling discharge and necrotic tissue in medal and lateral ulcer beds. No visible bone. Palpable DP pulse on the right.   ____________________________________________   LABS (all labs ordered are listed, but only abnormal results are displayed)  Labs Reviewed  COMPREHENSIVE METABOLIC PANEL - Abnormal; Notable for the following components:      Result Value    Potassium 2.7 (*)    Glucose, Bld 159 (*)    Creatinine, Ser 1.51 (*)    Calcium 8.5 (*)    Total Protein 6.0 (*)    Albumin 2.7 (*)    ALT 10 (*)    GFR calc non Af Amer 53 (*)    All other components within normal limits  CBC WITH DIFFERENTIAL/PLATELET - Abnormal; Notable for the following components:   RBC 3.08 (*)    Hemoglobin 9.4 (*)    HCT 27.3 (*)    All other components within normal limits  PROTIME-INR - Abnormal; Notable for the following components:   Prothrombin Time 15.6 (*)    All other components within normal limits  URINALYSIS, ROUTINE W REFLEX MICROSCOPIC - Abnormal; Notable for the following components:   APPearance HAZY (*)    Glucose, UA 150 (*)    Hgb urine dipstick SMALL (*)    Ketones, ur 5 (*)    Protein, ur >=300 (*)    All other components within normal limits  CBG MONITORING, ED - Abnormal; Notable for the following components:   Glucose-Capillary 150 (*)    All other components within normal limits  I-STAT CG4 LACTIC ACID, ED - Abnormal; Notable for the following components:   Lactic Acid, Venous 4.12 (*)    All other components within normal limits  CULTURE, BLOOD (ROUTINE X 2)  CULTURE, BLOOD (ROUTINE X 2)  URINE CULTURE  INFLUENZA PANEL BY PCR (TYPE A & B)  CBC  HEMOGLOBIN A1C  HIV ANTIBODY (ROUTINE TESTING)  SEDIMENTATION RATE  C-REACTIVE PROTEIN  PREALBUMIN  COMPREHENSIVE METABOLIC PANEL  I-STAT CG4 LACTIC ACID, ED  I-STAT TROPONIN, ED  I-STAT CG4 LACTIC ACID, ED  I-STAT CG4 LACTIC ACID, ED   ____________________________________________  RADIOLOGY  Dg Chest 2 View  Result Date: 10/30/2017 CLINICAL DATA:  Altered mental status with fever EXAM: CHEST - 2 VIEW COMPARISON:  08/31/2017 FINDINGS: Low lung volumes. No consolidation or effusion. Limited lateral view due to overlying soft tissues. Cardiomediastinal silhouette within normal limits. No pneumothorax. IMPRESSION: Low lung volumes.  No focal pulmonary infiltrate. Electronically  Signed   By: Donavan Foil M.D.   On: 10/30/2017 21:00   Dg Shoulder Right Port  Result Date: 10/30/2017 CLINICAL DATA:  Arm pain EXAM: PORTABLE RIGHT SHOULDER COMPARISON:  Chest x-ray 10/30/2017, 08/31/2017 FINDINGS: Limited by positioning. No fracture seen. Slight anterior positioning of the humeral head with respect to glenoid fossa but suspect that this may be related to positioning. Right lung apex clear IMPRESSION: 1. No fracture seen 2. Slight anterior positioning of the humeral head with respect to the glenoid fossa, suspect that this may be related to positioning. Electronically Signed   By: Donavan Foil M.D.   On: 10/30/2017 23:25   Dg Foot 2 Views Right  Result Date: 10/30/2017 CLINICAL DATA:  Chronic wound to the right foot EXAM: RIGHT FOOT - 2 VIEW COMPARISON:  10/12/2017,  08/26/2017, 07/14/2017 FINDINGS: No acute fracture is seen. Patient is status post amputation of first digit at the level of the mid first metatarsal. Increased bony proliferative changes about the cut margin of the first metatarsal with indistinct appearance of the cortex on the medial side. Vascular calcifications. No gas in the soft tissues IMPRESSION: Status post amputation of first digit at the level of the mid first metatarsal. Indistinct appearance of the medial cortex of the shaft of the residual metatarsal is concerning for ongoing osteomyelitis. Electronically Signed   By: Donavan Foil M.D.   On: 10/30/2017 21:05    ____________________________________________   PROCEDURES  Procedure(s) performed:   .Critical Care Performed by: Margette Fast, MD Authorized by: Margette Fast, MD   Critical care provider statement:    Critical care time (minutes):  45   Critical care time was exclusive of:  Separately billable procedures and treating other patients and teaching time   Critical care was necessary to treat or prevent imminent or life-threatening deterioration of the following conditions:  Sepsis    Critical care was time spent personally by me on the following activities:  Blood draw for specimens, development of treatment plan with patient or surrogate, discussions with consultants, evaluation of patient's response to treatment, examination of patient, obtaining history from patient or surrogate, ordering and performing treatments and interventions, ordering and review of laboratory studies, ordering and review of radiographic studies, pulse oximetry, re-evaluation of patient's condition and review of old charts   I assumed direction of critical care for this patient from another provider in my specialty: no       ____________________________________________   INITIAL IMPRESSION / [place] / ED COURSE  Pertinent labs & imaging results that were available during my care of the patient were reviewed by me and considered in my medical decision making (see chart for details).  Patient presents to the emergency department for evaluation of abdominal pain, fever, chest pain.  He is agitated on arrival which seems to be from his underlying blindness and anxiety.  He is able to provide a fairly coherent history.  Family are apparently in the waiting room and will be brought back shortly.  Patient does have a fever here some mild tachypnea.  His complaints seem mainly to center around his right upper quadrant abdominal pain.  Patient has multiple areas of skin breakdown the right foot with some concern for necrotic tissue in this area.   08:35 PM Patient lactate > 4. Ordered 66ml/kg bolus. Abx already ordered. Family at bedside attempting to calm the patient. They have been assisting with foot dressing changes but agree that skin discoloration looks worse. Patient requiring restraints at this time but Ativan given and patient's agitation and confusion are improving. Labs and imaging reviewed. Patient with x-ray concerning for ongoing osteo of the right foot. No PNA on CXR. Potassium also  low and will need to be replaced by admit team.   Discussed patient's case with Hospitalist, Dr. Alcario Drought to request admission. Patient and family (if present) updated with plan. Care transferred to Hospitalist service.  I reviewed all nursing notes, vitals, pertinent old records, EKGs, labs, imaging (as available).  ____________________________________________  FINAL CLINICAL IMPRESSION(S) / ED DIAGNOSES  Final diagnoses:  Sepsis, due to unspecified organism ([place])  Foot ulceration, right, with unspecified severity ([place])     MEDICATIONS GIVEN DURING THIS VISIT:  Medications  sodium chloride 0.9 % bolus 1,000 mL (0 mLs Intravenous Stopped 10/30/17 2125)  And  sodium chloride 0.9 % bolus 1,000 mL (0 mLs Intravenous Stopped 10/30/17 2238)    And  sodium chloride 0.9 % bolus 1,000 mL (0 mLs Intravenous Hold 10/30/17 2154)    And  sodium chloride 0.9 % bolus 500 mL (0 mLs Intravenous Stopped 10/30/17 2238)  metroNIDAZOLE (FLAGYL) IVPB 500 mg (has no administration in time range)  0.9 %  sodium chloride infusion (has no administration in time range)  acetaminophen (TYLENOL) tablet 650 mg (has no administration in time range)    Or  acetaminophen (TYLENOL) suppository 650 mg (has no administration in time range)  ondansetron (ZOFRAN) tablet 4 mg (has no administration in time range)    Or  ondansetron (ZOFRAN) injection 4 mg (has no administration in time range)  heparin injection 5,000 Units (has no administration in time range)  atorvastatin (LIPITOR) tablet 10 mg (has no administration in time range)  metoprolol succinate (TOPROL-XL) 24 hr tablet 25 mg (has no administration in time range)  timolol (TIMOPTIC) 0.5 % ophthalmic solution 1 drop (has no administration in time range)  thiamine tablet 100 mg (has no administration in time range)  pantoprazole (PROTONIX) EC tablet 40 mg (has no administration in time range)  sucralfate (CARAFATE) tablet 1 g (has no administration in time  range)  folic acid (FOLVITE) tablet 1 mg (has no administration in time range)  dicyclomine (BENTYL) capsule 10 mg (has no administration in time range)  hydrALAZINE (APRESOLINE) tablet 25 mg (has no administration in time range)  insulin aspart (novoLOG) injection 0-15 Units (has no administration in time range)  insulin glargine (LANTUS) injection 15 Units (has no administration in time range)  potassium chloride SA (K-DUR,KLOR-CON) CR tablet 40 mEq (has no administration in time range)  potassium chloride 10 mEq in 100 mL IVPB (has no administration in time range)  metoCLOPramide (REGLAN) injection 10 mg (has no administration in time range)  HYDROmorphone (DILAUDID) injection 0.5-1 mg (has no administration in time range)  vancomycin (VANCOCIN) IVPB 1000 mg/200 mL premix (has no administration in time range)  aztreonam (AZACTAM) 2 GM IVPB (has no administration in time range)  aztreonam (AZACTAM) 2 g in sodium chloride 0.9 % 100 mL IVPB (0 g Intravenous Stopped 10/30/17 2125)  metroNIDAZOLE (FLAGYL) IVPB 500 mg (0 mg Intravenous Stopped 10/30/17 2231)  acetaminophen (TYLENOL) tablet 1,000 mg (1,000 mg Oral Given 10/30/17 2101)  sodium chloride 0.9 % bolus 1,000 mL (0 mLs Intravenous Stopped 10/30/17 2125)  vancomycin (VANCOCIN) 1,500 mg in sodium chloride 0.9 % 500 mL IVPB (0 mg Intravenous Stopped 10/30/17 2333)  LORazepam (ATIVAN) injection 1 mg (1 mg Intravenous Given 10/30/17 2153)    Note:  This document was prepared using Dragon voice recognition software and may include unintentional dictation errors.  Nanda Quinton, MD Emergency Medicine    Brynlynn Walko, [facility], MD 10/31/17 0111

## 2017-10-30 NOTE — H&P (Signed)
History and Physical    Timothy Clark RKY:[id number] DOB: 21-Mar-1971 DOA: 10/30/2017  PCP: Rosalee Kaufman, PA-C  Patient coming from: Home  I have personally briefly reviewed patient's old medical records in [place]  Chief Complaint: Fever, AMS  HPI: Timothy Clark is a 47 y.o. male with medical history significant of DM, HTN, GERD, Gastroparesis, and blindness presents to the emergency department with difficulty breathing, abdominal pain, chest pain, fever.  Patient was driven to the emergency department by family who were concerned that he was not breathing well complaining of abdominal pain.  He has known right foot wound and states the dressing was last changed 3 days ago by his sister.  He is scheduled to follow-up with the wound care center in 1 week.  He currently states his abdominal pain is in the right upper quadrant and reports that his chest pain is mild.  No radiation of symptoms or modifying factors.  He states his RUQ pain which is severe, is typical for his diabetic gastroparesis pain, not different in any way.  He cannot feel his R foot.   ED Course: Patient has AMS compared to baseline (oriented but with agitation), and is in 4 limb restrains.  Per family he got like this last hospital stay (only worse that time apparently) and also had to be put in 4 limb restraints by EDP.  CXR neg, Tm 101.5, LFTs nl.  Foot looks more swollen than previously per family and compared to Dr. Amalia Hailey pictures.  Started on aztreonam, flagyl, vanc.  Lactate 4.   Review of Systems: As per HPI otherwise 10 point review of systems negative.   Past Medical History:  Diagnosis Date  . Chronic abdominal pain   . Esophagitis   . Eye hemorrhage   . Gastritis   . Gastroparesis   . GERD (gastroesophageal reflux disease)   . Glaucoma   . Helicobacter pylori (H. pylori) infection 2016   ERADICATION DOCUMENTED VIA STOOL TEST in AUG 2016 at [facility]  . Hiccups   . Hypertension    . Nausea and vomiting    chronic, recurrent  . Type 2 diabetes mellitus with complications (HCC)    diagnosed around age 86    Past Surgical History:  Procedure Laterality Date  . CATARACT EXTRACTION W/PHACO Left 05/19/2016   Procedure: CATARACT EXTRACTION PHACO AND INTRAOCULAR LENS PLACEMENT LEFT EYE;  Surgeon: Tonny Branch, MD;  Location: [facility];  Service: Ophthalmology;  Laterality: Left;  CDE: 7.30  . CATARACT EXTRACTION W/PHACO Right 06/23/2016   Procedure: CATARACT EXTRACTION PHACO AND INTRAOCULAR LENS PLACEMENT RIGHT EYE CDE=9.87;  Surgeon: Tonny Branch, MD;  Location: [facility];  Service: Ophthalmology;  Laterality: Right;  right  . ESOPHAGOGASTRODUODENOSCOPY  2015   Dr. Britta Mccreedy  . ESOPHAGOGASTRODUODENOSCOPY N/A 10/26/2014   RMR: Distal esophagititis-likely reflux related although an element of pill induced injuruy no exclueded  status post biopsy. Diffusely abnormal gastric mucosa of uncertain signigicane -status post gastric biopsy. Focal area of excoriation in the cardia most consistant with a trauma of heaving.   . ESOPHAGOGASTRODUODENOSCOPY  08/2015   [facility]: mild esophagitis and old gastric contents  . EYE SURGERY  2015   stent placed to left eye  . EYE SURGERY    . INCISION AND DRAINAGE OF WOUND Right 07/16/2017   Procedure: IRRIGATION AND DEBRIDEMENT OF SOFT TISSUE OF ULCERATION RIGHT FOOT;  Surgeon: Caprice Beaver, DPM;  Location: [facility];  Service: Podiatry;  Laterality: Right;  reports that he has never smoked. He has never used smokeless tobacco. He reports that he does not drink alcohol or use drugs.  Allergies  Allergen Reactions  . Donnatal [Pb-Hyoscy-Atropine-Scopol Er] Anaphylaxis    Reaction to GI cocktail  . Fish Allergy Anaphylaxis, Hives and Rash  . Haldol [Haloperidol Lactate] Other (See Comments)    Chest Pain  . Lidocaine Anaphylaxis    Reaction to GI cocktail  . Maalox [Calcium Carbonate Antacid] Anaphylaxis    Reaction to GI cocktail  . Aspirin  Hives and Itching  . Bactrim [Sulfamethoxazole-Trimethoprim] Itching  . Keflex [Cephalexin] Hives, Itching and Nausea And Vomiting  . Lisinopril   . Penicillins Hives and Itching    Has patient had a PCN reaction causing immediate rash, facial/tongue/throat swelling, SOB or lightheadedness with hypotension: yes Has patient had a PCN reaction causing severe rash involving mucus membranes or skin necrosis: yes Has patient had a PCN reaction that required hospitalization no Has patient had a PCN reaction occurring within the last 10 years: yes If all of the above answers are "NO", then may proceed with Cephalospor  . Phenergan [Promethazine Hcl] Nausea And Vomiting    Family History  Problem Relation Age of Onset  . Ovarian cancer Mother   . Heart attack Father   . Cervical cancer Sister   . Diabetes Sister   . Colon cancer Neg Hx      Prior to Admission medications   Medication Sig Start Date End Date Taking? Authorizing Provider  atorvastatin (LIPITOR) 10 MG tablet Take 10 mg by mouth every evening.    Yes [provider]  cetirizine (ZYRTEC) 10 MG tablet Take 10 mg by mouth daily.   Yes [provider]  cloNIDine (CATAPRES) 0.1 MG tablet Take 1 tablet (0.1 mg total) by mouth 2 (two) times daily. Patient taking differently: Take 0.1 mg by mouth daily.  09/24/17  Yes Georgette Shell, MD  dicyclomine (BENTYL) 10 MG capsule TAKE 1 CAPSULE BY MOUTH 4 TIMES DAILY BEFORE MEAL(S) AND AT BEDTIME 08/06/17  Yes Carlis Stable, NP  hydrALAZINE (APRESOLINE) 25 MG tablet Take 1 tablet (25 mg total) by mouth every 8 (eight) hours. 09/25/17  Yes Georgette Shell, MD  insulin aspart protamine- aspart (NOVOLOG MIX 70/30) (70-30) 100 UNIT/ML injection Inject 0.25 mLs (25 Units total) into the skin 2 (two) times daily with a meal. Patient taking differently: Inject 35 Units into the skin 2 (two) times daily with a meal.  09/24/17  Yes Georgette Shell, MD  lisinopril  (PRINIVIL,ZESTRIL) 5 MG tablet Take 1 tablet (5 mg total) by mouth daily. 09/24/17  Yes Georgette Shell, MD  metoprolol succinate (TOPROL-XL) 25 MG 24 hr tablet Take 25 mg by mouth daily.   Yes [provider]  omeprazole (PRILOSEC) 40 MG capsule Take 40 mg by mouth 2 (two) times daily.   Yes [provider]  pantoprazole (PROTONIX) 40 MG tablet TAKE 1 TABLET BY MOUTH TWICE DAILY BEFORE  A  MEAL 10/21/17  Yes Mahala Menghini, PA-C  sitaGLIPtin (JANUVIA) 100 MG tablet Take 100 mg by mouth daily.   Yes [provider]  sucralfate (CARAFATE) 1 g tablet TAKE ONE TABLET BY MOUTH 4 TIMES DAILY 03/12/16  Yes Annitta Needs, NP  timolol (TIMOPTIC) 0.5 % ophthalmic solution Place 1 drop into both eyes 2 (two) times daily.   Yes [provider]  acetaminophen (TYLENOL) 325 MG tablet Take 2 tablets (650 mg total) by  mouth every 6 (six) hours as needed for mild pain (or Fever >/= 101). 09/24/17   Georgette Shell, MD  folic acid (FOLVITE) 1 MG tablet Take 1 tablet (1 mg total) by mouth daily. 09/24/17   Georgette Shell, MD  thiamine 100 MG tablet Take 1 tablet (100 mg total) by mouth daily. 09/24/17   Georgette Shell, MD    Physical Exam: Vitals:   10/30/17 2005 10/30/17 2008  BP: (!) 176/89   Pulse: 91   Resp: (!) 22   Temp: (!) 101.5 F (38.6 C)   TempSrc: Rectal   SpO2: 100%   Weight:  (!) 143.8 kg (317 lb)  Height:  6' (1.829 m)    Constitutional: NAD, calm, comfortable Eyes: Blind ENMT: Mucous membranes are moist. Posterior pharynx clear of any exudate or lesions.Normal dentition.  Neck: normal, supple, no masses, no thyromegaly Respiratory: clear to auscultation bilaterally, no wheezing, no crackles. Normal respiratory effort. No accessory muscle use.  Cardiovascular: Regular rate and rhythm, no murmurs / rubs / gallops. No extremity edema. 2+ pedal pulses. No carotid bruits.  Abdomen: RUQ TTP, no rebound Musculoskeletal: no clubbing /  cyanosis. No joint deformity upper and lower extremities. Good ROM, no contractures. Normal muscle tone.  Skin: R foot edema, erythema, necrotic ulcers.  Surrounding cellulitis. Neurologic: CN 2-12 grossly intact. Sensation intact, DTR normal. Strength 5/5 in all 4.  Psychiatric: Oriented, intermittent agitation, is in 4 limb restraints.   Labs on Admission: I have personally reviewed following labs and imaging studies  CBC: Recent Labs  Lab 10/30/17 2020  WBC 9.3  NEUTROABS 7.6  HGB 9.4*  HCT 27.3*  MCV 88.6  PLT 599   Basic Metabolic Panel: Recent Labs  Lab 10/30/17 2020  NA 142  K 2.7*  CL 105  CO2 25  GLUCOSE 159*  BUN 8  CREATININE 1.51*  CALCIUM 8.5*   GFR: Estimated Creatinine Clearance: 89 mL/min (A) (by C-G formula based on SCr of 1.51 mg/dL (H)). Liver Function Tests: Recent Labs  Lab 10/30/17 2020  AST 25  ALT 10*  ALKPHOS 59  BILITOT 0.8  PROT 6.0*  ALBUMIN 2.7*   No results for input(s): LIPASE, AMYLASE in the last 168 hours. No results for input(s): AMMONIA in the last 168 hours. Coagulation Profile: Recent Labs  Lab 10/30/17 2020  INR 1.25   Cardiac Enzymes: No results for input(s): CKTOTAL, CKMB, CKMBINDEX, TROPONINI in the last 168 hours. BNP (last 3 results) No results for input(s): PROBNP in the last 8760 hours. HbA1C: No results for input(s): HGBA1C in the last 72 hours. CBG: Recent Labs  Lab 10/30/17 1957  GLUCAP 150*   Lipid Profile: No results for input(s): CHOL, HDL, LDLCALC, TRIG, CHOLHDL, LDLDIRECT in the last 72 hours. Thyroid Function Tests: No results for input(s): TSH, T4TOTAL, FREET4, T3FREE, THYROIDAB in the last 72 hours. Anemia Panel: No results for input(s): VITAMINB12, FOLATE, FERRITIN, TIBC, IRON, RETICCTPCT in the last 72 hours. Urine analysis:    Component Value Date/Time   COLORURINE YELLOW 07/14/2017 2200   APPEARANCEUR CLEAR 07/14/2017 2200   LABSPEC 1.013 07/14/2017 2200   PHURINE 7.0 07/14/2017  2200   GLUCOSEU >=500 (A) 07/14/2017 2200   HGBUR SMALL (A) 07/14/2017 2200   BILIRUBINUR NEGATIVE 07/14/2017 2200   KETONESUR NEGATIVE 07/14/2017 2200   PROTEINUR >=300 (A) 07/14/2017 2200   UROBILINOGEN 0.2 06/07/2015 1353   NITRITE NEGATIVE 07/14/2017 2200   LEUKOCYTESUR NEGATIVE 07/14/2017 2200    Radiological Exams on  Admission: Dg Chest 2 View  Result Date: 10/30/2017 CLINICAL DATA:  Altered mental status with fever EXAM: CHEST - 2 VIEW COMPARISON:  08/31/2017 FINDINGS: Low lung volumes. No consolidation or effusion. Limited lateral view due to overlying soft tissues. Cardiomediastinal silhouette within normal limits. No pneumothorax. IMPRESSION: Low lung volumes.  No focal pulmonary infiltrate. Electronically Signed   By: Donavan Foil M.D.   On: 10/30/2017 21:00   Dg Foot 2 Views Right  Result Date: 10/30/2017 CLINICAL DATA:  Chronic wound to the right foot EXAM: RIGHT FOOT - 2 VIEW COMPARISON:  10/12/2017, 08/26/2017, 07/14/2017 FINDINGS: No acute fracture is seen. Patient is status post amputation of first digit at the level of the mid first metatarsal. Increased bony proliferative changes about the cut margin of the first metatarsal with indistinct appearance of the cortex on the medial side. Vascular calcifications. No gas in the soft tissues IMPRESSION: Status post amputation of first digit at the level of the mid first metatarsal. Indistinct appearance of the medial cortex of the shaft of the residual metatarsal is concerning for ongoing osteomyelitis. Electronically Signed   By: Donavan Foil M.D.   On: 10/30/2017 21:05    EKG: Independently reviewed.  Assessment/Plan Principal Problem:   DIABETIC FOOT ULCER with Osteomyelitis Active Problems:   Diabetes mellitus type 2 with complications, uncontrolled ([place])   Essential hypertension   Diabetic foot infection ([place])   Sepsis ([place])   CKD stage 3 due to type 2 diabetes mellitus (HCC)   Gangrene of right foot (HCC)   ACE  inhibitor-aggravated angioedema, initial encounter   Right shoulder pain   Restlessness and agitation    1. Sepsis - likely due to R necrotic foot 1. Aztreonam, flagyl, vanc 2. Diabetic foot pathway 3. IVF: sepsis bolus then 125 cc/hr 4. Repeat lactate 5. Cultures pending 6. Call Dr. Amalia Hailey in AM 7. Call vascular surgery in AM 8. Tele monitor on SDU 2. RUQ pain - 1. RUQ [place] 2. Repeat CMP in AM 3. Reglan for presumed gastroparesis - "Reglan and dilaudid" are what works per patient.  Will try it his way. 3. R shoulder pain - since traumatic injury recently 1. Getting R shoulder X ray 2. Dilaudid PRN 4. Agitation - 1. Will see if they can de-escalate restraints to soft limb 2. Family wants restraints kept on, says he will get out of bed without them 3. Maybe less agitated once pain controlled? 5. HTN - continue home BP meds except lisinopril 6. Mouth swelling - Mild, oncet in recent weeks, but suspicious of ACEi angioedema 1. Stop ACEi 7. CKD stage 3 - creat at baseline 8. DM2 - 1. Lantus 15u BID 2. Mod scale SSI AC  DVT prophylaxis: Lovenox Code Status: Full Family Communication: Family at bedside Disposition Plan: Admit to [place] called: None Admission status: Admit to inpatient   [place], [place] [facility] Pager [phone]  If 7AM-7PM, please contact day team taking care of patient [email or web address] Password [facility]  10/30/2017, 10:57 PM

## 2017-10-31 ENCOUNTER — Inpatient Hospital Stay (HOSPITAL_COMMUNITY): Payer: Medicare HMO

## 2017-10-31 ENCOUNTER — Other Ambulatory Visit: Payer: Self-pay

## 2017-10-31 ENCOUNTER — Encounter (HOSPITAL_COMMUNITY): Payer: Self-pay

## 2017-10-31 DIAGNOSIS — Z6841 Body Mass Index (BMI) 40.0 and over, adult: Secondary | ICD-10-CM | POA: Diagnosis not present

## 2017-10-31 DIAGNOSIS — I70234 Atherosclerosis of native arteries of right leg with ulceration of heel and midfoot: Secondary | ICD-10-CM

## 2017-10-31 DIAGNOSIS — E46 Unspecified protein-calorie malnutrition: Secondary | ICD-10-CM | POA: Diagnosis not present

## 2017-10-31 DIAGNOSIS — M86671 Other chronic osteomyelitis, right ankle and foot: Secondary | ICD-10-CM | POA: Diagnosis not present

## 2017-10-31 DIAGNOSIS — L03115 Cellulitis of right lower limb: Secondary | ICD-10-CM | POA: Diagnosis not present

## 2017-10-31 DIAGNOSIS — I130 Hypertensive heart and chronic kidney disease with heart failure and stage 1 through stage 4 chronic kidney disease, or unspecified chronic kidney disease: Secondary | ICD-10-CM | POA: Diagnosis not present

## 2017-10-31 DIAGNOSIS — I7090 Unspecified atherosclerosis: Secondary | ICD-10-CM

## 2017-10-31 DIAGNOSIS — I5033 Acute on chronic diastolic (congestive) heart failure: Secondary | ICD-10-CM | POA: Diagnosis not present

## 2017-10-31 DIAGNOSIS — L97429 Non-pressure chronic ulcer of left heel and midfoot with unspecified severity: Secondary | ICD-10-CM | POA: Diagnosis not present

## 2017-10-31 DIAGNOSIS — E1152 Type 2 diabetes mellitus with diabetic peripheral angiopathy with gangrene: Secondary | ICD-10-CM | POA: Diagnosis not present

## 2017-10-31 DIAGNOSIS — A419 Sepsis, unspecified organism: Secondary | ICD-10-CM | POA: Diagnosis not present

## 2017-10-31 LAB — CBC
HCT: 24.8 % — ABNORMAL LOW (ref 39.0–52.0)
Hemoglobin: 8.5 g/dL — ABNORMAL LOW (ref 13.0–17.0)
MCH: 30.7 pg (ref 26.0–34.0)
MCHC: 34.3 g/dL (ref 30.0–36.0)
MCV: 89.5 fL (ref 78.0–100.0)
Platelets: 156 10*3/uL (ref 150–400)
RBC: 2.77 MIL/uL — ABNORMAL LOW (ref 4.22–5.81)
RDW: 13.5 % (ref 11.5–15.5)
WBC: 8.1 10*3/uL (ref 4.0–10.5)

## 2017-10-31 LAB — COMPREHENSIVE METABOLIC PANEL
ALT: 7 U/L — ABNORMAL LOW (ref 17–63)
AST: 20 U/L (ref 15–41)
Albumin: 2.2 g/dL — ABNORMAL LOW (ref 3.5–5.0)
Alkaline Phosphatase: 54 U/L (ref 38–126)
Anion gap: 10 (ref 5–15)
BUN: 6 mg/dL (ref 6–20)
CO2: 25 mmol/L (ref 22–32)
Calcium: 7.5 mg/dL — ABNORMAL LOW (ref 8.9–10.3)
Chloride: 103 mmol/L (ref 101–111)
Creatinine, Ser: 1.37 mg/dL — ABNORMAL HIGH (ref 0.61–1.24)
GFR calc Af Amer: >60 mL/min (ref >60)
GFR calc non Af Amer: >60 mL/min (ref >60)
Glucose, Bld: 87 mg/dL (ref 65–99)
Potassium: 3 mmol/L — ABNORMAL LOW (ref 3.5–5.1)
Sodium: 138 mmol/L (ref 135–145)
Total Bilirubin: 0.8 mg/dL (ref 0.3–1.2)
Total Protein: 5.1 g/dL — ABNORMAL LOW (ref 6.5–8.1)

## 2017-10-31 LAB — HEMOGLOBIN A1C
Hgb A1c MFr Bld: 6.7 % — ABNORMAL HIGH (ref 4.8–5.6)
Mean Plasma Glucose: 145.59 mg/dL

## 2017-10-31 LAB — URINALYSIS, ROUTINE W REFLEX MICROSCOPIC
Bacteria, UA: NONE SEEN
Bilirubin Urine: NEGATIVE
Glucose, UA: 150 mg/dL — AB
Ketones, ur: 5 mg/dL — AB
Leukocytes, UA: NEGATIVE
Nitrite: NEGATIVE
Protein, ur: >=300 mg/dL — AB
Specific Gravity, Urine: 1.013 (ref 1.005–1.030)
Squamous Epithelial / LPF: NONE SEEN
pH: 7 (ref 5.0–8.0)

## 2017-10-31 LAB — LACTIC ACID, PLASMA
Lactic Acid, Venous: 1.3 mmol/L (ref 0.5–1.9)
Lactic Acid, Venous: 1 mmol/L (ref 0.5–1.9)

## 2017-10-31 LAB — GLUCOSE, CAPILLARY
Glucose-Capillary: 75 mg/dL (ref 65–99)
Glucose-Capillary: 100 mg/dL — ABNORMAL HIGH (ref 65–99)
Glucose-Capillary: 103 mg/dL — ABNORMAL HIGH (ref 65–99)
Glucose-Capillary: 111 mg/dL — ABNORMAL HIGH (ref 65–99)

## 2017-10-31 LAB — C-REACTIVE PROTEIN: CRP: <0.8 mg/dL (ref <1.0)

## 2017-10-31 LAB — INFLUENZA PANEL BY PCR (TYPE A & B)
Influenza A By PCR: NEGATIVE
Influenza B By PCR: NEGATIVE

## 2017-10-31 LAB — HIV ANTIBODY (ROUTINE TESTING W REFLEX): HIV Screen 4th Generation wRfx: NONREACTIVE

## 2017-10-31 LAB — SEDIMENTATION RATE: Sed Rate: 80 mm/hr — ABNORMAL HIGH (ref 0–16)

## 2017-10-31 LAB — PREALBUMIN: Prealbumin: 16.3 mg/dL — ABNORMAL LOW (ref 18–38)

## 2017-10-31 LAB — MRSA PCR SCREENING: MRSA by PCR: NEGATIVE

## 2017-10-31 MED ORDER — LORAZEPAM 2 MG/ML IJ SOLN
2.0000 mg | Freq: Once | INTRAMUSCULAR | Status: AC
Start: 2017-10-31 — End: 2017-10-31
  Administered 2017-10-31: 2 mg via INTRAVENOUS

## 2017-10-31 MED ORDER — POTASSIUM CHLORIDE 10 MEQ/100ML IV SOLN
10.0000 meq | INTRAVENOUS | Status: AC
  Administered 2017-10-31 (×4): 10 meq via INTRAVENOUS
  Filled 2017-10-31 (×4): qty 100

## 2017-10-31 MED ORDER — LORAZEPAM 2 MG/ML IJ SOLN
1.0000 mg | Freq: Four times a day (QID) | INTRAMUSCULAR | Status: DC | PRN
  Administered 2017-10-31 – 2017-11-05 (×10): 1 mg via INTRAVENOUS
  Filled 2017-10-31 (×10): qty 1

## 2017-10-31 MED ORDER — HYDRALAZINE HCL 50 MG PO TABS
50.0000 mg | ORAL_TABLET | Freq: Three times a day (TID) | ORAL | Status: DC
  Administered 2017-10-31 – 2017-11-06 (×17): 50 mg via ORAL
  Filled 2017-10-31 (×17): qty 1

## 2017-10-31 MED ORDER — PRO-STAT SUGAR FREE PO LIQD
30.0000 mL | Freq: Two times a day (BID) | ORAL | Status: DC
  Administered 2017-10-31 – 2017-11-06 (×12): 30 mL via ORAL
  Filled 2017-10-31 (×12): qty 30

## 2017-10-31 MED ORDER — POTASSIUM CHLORIDE 10 MEQ/100ML IV SOLN
INTRAVENOUS | Status: AC
  Filled 2017-10-31: qty 100

## 2017-10-31 MED ORDER — POTASSIUM CHLORIDE 10 MEQ/100ML IV SOLN
INTRAVENOUS | Status: AC
  Administered 2017-10-31: 10 meq via INTRAVENOUS
  Filled 2017-10-31: qty 100

## 2017-10-31 MED ORDER — LORAZEPAM 2 MG/ML IJ SOLN
INTRAMUSCULAR | Status: AC
  Administered 2017-10-31: 2 mg
  Filled 2017-10-31: qty 1

## 2017-10-31 MED ORDER — VANCOMYCIN HCL 10 G IV SOLR
1500.0000 mg | Freq: Three times a day (TID) | INTRAVENOUS | Status: DC
  Administered 2017-10-31 – 2017-11-01 (×4): 1500 mg via INTRAVENOUS
  Filled 2017-10-31 (×6): qty 1500

## 2017-10-31 MED ORDER — SODIUM CHLORIDE 0.9 % IV SOLN
2.0000 g | Freq: Three times a day (TID) | INTRAVENOUS | Status: DC
  Administered 2017-10-31 – 2017-11-04 (×13): 2 g via INTRAVENOUS
  Filled 2017-10-31 (×16): qty 2

## 2017-10-31 MED ORDER — HYDRALAZINE HCL 20 MG/ML IJ SOLN
10.0000 mg | Freq: Three times a day (TID) | INTRAMUSCULAR | Status: DC | PRN
  Administered 2017-10-31: 10 mg via INTRAVENOUS
  Filled 2017-10-31 (×2): qty 1

## 2017-10-31 NOTE — Progress Notes (Addendum)
Initial Nutrition Assessment  DOCUMENTATION CODES:   Morbid obesity  INTERVENTION:   -30 ml Prostat BID, each supplement provides 100 kcals and 15 grams protein  NUTRITION DIAGNOSIS:   Increased nutrient needs related to wound healing as evidenced by estimated needs.  GOAL:   Patient will meet greater than or equal to 90% of their needs  MONITOR:   PO intake, Supplement acceptance, Labs, Weight trends, Skin, I & O's  REASON FOR ASSESSMENT:   Malnutrition Screening Tool, Consult Wound healing  ASSESSMENT:   Timothy Clark is a 47 y.o. male with medical history significant of DM, HTN, GERD, Gastroparesis, and blindness presents to the emergency department with difficulty breathing, abdominal pain, chest pain, fever.  Pt admitted with sepsis likely related to rt necrotic foot.   Pt sleeping soundly at time of visit. He did not arouse during exam.   Per doc flowsheets, meal completion 75%.   Reviewed wt hx; UBW around 290-300#. Suspect wt gain likely related to edema. Reviewed I/O's: +5.2 L x 24 hours.   Last Hgb A1c: 6.7 (10/31/17), which inidicates good control. PTA DM medications 25 units insulin aspart- protamine aspart BID and 100 sitagliptin daily.   Labs reviewed: K: 3.0, CBGS: 75 (inpatient orders for glycemic control are 0-15 units insulin aspart TID with meals and 15 units insulin glargine BID).   NUTRITION - FOCUSED PHYSICAL EXAM:    Most Recent Value  Orbital Region  No depletion  Upper Arm Region  No depletion  Thoracic and Lumbar Region  No depletion  Buccal Region  No depletion  Temple Region  No depletion  Clavicle Bone Region  No depletion  Clavicle and Acromion Bone Region  No depletion  Scapular Bone Region  No depletion  Dorsal Hand  No depletion  Patellar Region  No depletion  Anterior Thigh Region  No depletion  Posterior Calf Region  No depletion  Edema (RD Assessment)  None  Hair  Reviewed  Eyes  Reviewed  Mouth  Reviewed  Skin   Reviewed  Nails  Reviewed     Diet Order:  Diet Carb Modified Fluid consistency: Thin; Room service appropriate? Yes  EDUCATION NEEDS:   No education needs have been identified at this time  Skin:  Skin Assessment: Skin Integrity Issues: Skin Integrity Issues:: Diabetic Ulcer Diabetic Ulcer: bilateral feet  Last BM:  PTA  Height:   Ht Readings from Last 1 Encounters:  10/30/17 6' (1.829 m)    Weight:   Wt Readings from Last 1 Encounters:  10/30/17 (!) 317 lb (143.8 kg)    Ideal Body Weight:  80.9 kg  BMI:  Body mass index is 42.99 kg/m.  Estimated Nutritional Needs:   Kcal:  1800-2000  Protein:  120-135 grams  Fluid:  1.8-2.0 L    Zanyla Klebba A. Jimmye Norman, RD, LDN, CDE Pager: [phone] After hours Pager: [phone]

## 2017-10-31 NOTE — Clinical Social Work Note (Signed)
CSW acknowledges consult, "Access meds for discharge." Please consult RNCM for this need.  CSW signing off. Consult again if any other social work needs arise.  Dayton Scrape, [place]

## 2017-10-31 NOTE — ED Notes (Signed)
ED TO INPATIENT HANDOFF REPORT  Name/Age/Gender Timothy Clark 47 y.o. male  Code Status    Code Status Orders  (From admission, onward)        Start     Ordered   10/30/17 2220  Full code  Continuous     10/30/17 2220    Code Status History    Date Active Date Inactive Code Status Order ID Comments User Context   09/21/2017 1521 09/26/2017 1846 Full Code [id number]  Patrecia Pour, MD ED   09/20/2017 2227 09/21/2017 1521 Full Code [id number]  Antonietta Breach, PA-C ED   07/15/2017 0143 07/18/2017 2000 Full Code [id number]  Reubin Milan, MD Inpatient   06/10/2017 1936 06/16/2017 2258 Full Code [id number]  Eber Jones, MD Inpatient   01/31/2017 1745 02/02/2017 1430 Full Code [id number]  Truett Mainland, DO Inpatient   09/13/2016 1955 09/15/2016 1736 Full Code [id number]  Truett Mainland, DO Inpatient   09/05/2016 2026 09/07/2016 1720 Full Code [id number]  Phillips Grout, MD Inpatient   02/17/2015 2336 02/20/2015 1535 Full Code [id number]  Jani Gravel, MD Inpatient   01/15/2015 0450 01/16/2015 1627 Full Code [id number]  Phillips Grout, MD Inpatient   10/25/2014 1330 10/29/2014 1552 Full Code [id number]  Doree Albee, MD ED      Home/SNF/Other Home  Chief Complaint chest pain  Level of Care/Admitting Diagnosis ED Disposition    ED Disposition Condition [facility]: [place] [100100]  Level of Care: Stepdown [14]  Diagnosis: Sepsis [facility]) [[id number]]  Admitting Physician: Doreatha Massed  Attending Physician: Etta Quill [phone]  Estimated length of stay: past midnight tomorrow  Certification:: I certify this patient will need inpatient services for at least 2 midnights  PT Class (Do Not Modify): Inpatient [101]  PT Acc Code (Do Not Modify): Private [1]       Medical History Past Medical History:  Diagnosis Date  . Chronic abdominal pain   . Esophagitis   . Eye hemorrhage   . Gastritis   . Gastroparesis   . GERD  (gastroesophageal reflux disease)   . Glaucoma   . Helicobacter pylori (H. pylori) infection 2016   ERADICATION DOCUMENTED VIA STOOL TEST in AUG 2016 at [place]  . Hiccups   . Hypertension   . Nausea and vomiting    chronic, recurrent  . Type 2 diabetes mellitus with complications (HCC)    diagnosed around age 11    Allergies Allergies  Allergen Reactions  . Donnatal [Pb-Hyoscy-Atropine-Scopol Er] Anaphylaxis    Reaction to GI cocktail  . Fish Allergy Anaphylaxis, Hives and Rash  . Haldol [Haloperidol Lactate] Other (See Comments)    Chest Pain  . Lidocaine Anaphylaxis    Reaction to GI cocktail  . Maalox [Calcium Carbonate Antacid] Anaphylaxis    Reaction to GI cocktail  . Aspirin Hives and Itching  . Bactrim [Sulfamethoxazole-Trimethoprim] Itching  . Keflex [Cephalexin] Hives, Itching and Nausea And Vomiting  . Lisinopril   . Penicillins Hives and Itching    Has patient had a PCN reaction causing immediate rash, facial/tongue/throat swelling, SOB or lightheadedness with hypotension: yes Has patient had a PCN reaction causing severe rash involving mucus membranes or skin necrosis: yes Has patient had a PCN reaction that required hospitalization no Has patient had a PCN reaction occurring within the last 10 years: yes If all of the above answers are "NO", then may proceed with Cephalospor  . Phenergan [  Promethazine Hcl] Nausea And Vomiting    IV Location/Drains/Wounds Patient Lines/Drains/Airways Status   Active Line/Drains/Airways    Name:   Placement date:   Placement time:   Site:   Days:   Peripheral IV 10/30/17 Left Forearm   10/30/17    2125    Forearm   1   Peripheral IV 10/30/17 Right;Upper Arm   10/30/17    2025    Arm   1   Incision (Closed) 09/22/17 Foot Right   09/22/17    0300     39          Labs/Imaging Results for orders placed or performed during the hospital encounter of 10/30/17 (from the past 48 hour(s))  CBG monitoring, ED     Status: Abnormal    Collection Time: 10/30/17  7:57 PM  Result Value Ref Range   Glucose-Capillary 150 (H) 65 - 99 mg/dL  Comprehensive metabolic panel     Status: Abnormal   Collection Time: 10/30/17  8:20 PM  Result Value Ref Range   Sodium 142 135 - 145 mmol/L   Potassium 2.7 (LL) 3.5 - 5.1 mmol/L    Comment: CRITICAL RESULT CALLED TO, READ BACK BY AND VERIFIED WITH: TALKINGTON,J AT 2103 ON 10/30/2017 BY MOSLEY,J    Chloride 105 101 - 111 mmol/L   CO2 25 22 - 32 mmol/L   Glucose, Bld 159 (H) 65 - 99 mg/dL   BUN 8 6 - 20 mg/dL   Creatinine, Ser 1.51 (H) 0.61 - 1.24 mg/dL   Calcium 8.5 (L) 8.9 - 10.3 mg/dL   Total Protein 6.0 (L) 6.5 - 8.1 g/dL   Albumin 2.7 (L) 3.5 - 5.0 g/dL   AST 25 15 - 41 U/L   ALT 10 (L) 17 - 63 U/L   Alkaline Phosphatase 59 38 - 126 U/L   Total Bilirubin 0.8 0.3 - 1.2 mg/dL   GFR calc non Af Amer 53 (L) >60 mL/min   GFR calc Af Amer >60 >60 mL/min    Comment: (NOTE) The eGFR has been calculated using the CKD EPI equation. This calculation has not been validated in all clinical situations. eGFR's persistently <60 mL/min signify possible Chronic Kidney Disease.    Anion gap 12 5 - 15    Comment: Performed at [facility], [place] [address] [place], [place] [address]  CBC with Differential     Status: Abnormal   Collection Time: 10/30/17  8:20 PM  Result Value Ref Range   WBC 9.3 4.0 - 10.5 K/uL   RBC 3.08 (L) 4.22 - 5.81 MIL/uL   Hemoglobin 9.4 (L) 13.0 - 17.0 g/dL   HCT 27.3 (L) 39.0 - 52.0 %   MCV 88.6 78.0 - 100.0 fL   MCH 30.5 26.0 - 34.0 pg   MCHC 34.4 30.0 - 36.0 g/dL   RDW 13.4 11.5 - 15.5 %   Platelets 169 150 - 400 K/uL   Neutrophils Relative % 82 %   Neutro Abs 7.6 1.7 - 7.7 K/uL   Lymphocytes Relative 12 %   Lymphs Abs 1.1 0.7 - 4.0 K/uL   Monocytes Relative 6 %   Monocytes Absolute 0.5 0.1 - 1.0 K/uL   Eosinophils Relative 0 %   Eosinophils Absolute 0.0 0.0 - 0.7 K/uL   Basophils Relative 0 %   Basophils Absolute 0.0 0.0 - 0.1  K/uL    Comment: Performed at [facility], [place] [address], [place], [place] [address]  Protime-INR  Status: Abnormal   Collection Time: 10/30/17  8:20 PM  Result Value Ref Range   Prothrombin Time 15.6 (H) 11.4 - 15.2 seconds   INR 1.25     Comment: Performed at [facility], [place] [address] [place], [place] [address]  I-stat troponin, ED (not at [facility])     Status: None   Collection Time: 10/30/17  8:28 PM  Result Value Ref Range   Troponin i, poc 0.01 0.00 - 0.08 ng/mL   Comment 3            Comment: Due to the release kinetics of cTnI, a negative result within the first hours of the onset of symptoms does not rule out myocardial infarction with certainty. If myocardial infarction is still suspected, repeat the test at appropriate intervals.   I-Stat CG4 Lactic Acid, ED     Status: Abnormal   Collection Time: 10/30/17  8:31 PM  Result Value Ref Range   Lactic Acid, Venous 4.12 (HH) 0.5 - 1.9 mmol/L   Comment NOTIFIED PHYSICIAN   Culture, blood (Routine x 2)     Status: None (Preliminary result)   Collection Time: 10/30/17  9:21 PM  Result Value Ref Range   Specimen Description      BLOOD LEFT FOREARM Performed at [place] [address] [place], [place] [address]    Special Requests      BOTTLES DRAWN AEROBIC AND ANAEROBIC Blood Culture adequate volume Performed at [facility], [place] [address], [place], [place] [address]    Culture PENDING    Report Status PENDING   Urinalysis, Routine w reflex microscopic     Status: Abnormal   Collection Time: 10/30/17 11:32 PM  Result Value Ref Range   Color, Urine YELLOW YELLOW   APPearance HAZY (A) CLEAR   Specific Gravity, Urine 1.013 1.005 - 1.030   pH 7.0 5.0 - 8.0   Glucose, UA 150 (A) NEGATIVE mg/dL   Hgb urine dipstick SMALL (A) NEGATIVE   Bilirubin Urine NEGATIVE NEGATIVE   Ketones, ur 5 (A) NEGATIVE mg/dL   Protein, ur >=300 (A) NEGATIVE mg/dL    Nitrite NEGATIVE NEGATIVE   Leukocytes, UA NEGATIVE NEGATIVE   RBC / HPF 6-30 0 - 5 RBC/hpf   WBC, UA 0-5 0 - 5 WBC/hpf   Bacteria, UA NONE SEEN NONE SEEN   Squamous Epithelial / LPF NONE SEEN NONE SEEN   Mucus PRESENT    Hyaline Casts, UA PRESENT     Comment: Performed at [facility], [place] [address] [place], [place] [address]   Dg Chest 2 View  Result Date: 10/30/2017 CLINICAL DATA:  Altered mental status with fever EXAM: CHEST - 2 VIEW COMPARISON:  08/31/2017 FINDINGS: Low lung volumes. No consolidation or effusion. Limited lateral view due to overlying soft tissues. Cardiomediastinal silhouette within normal limits. No pneumothorax. IMPRESSION: Low lung volumes.  No focal pulmonary infiltrate. Electronically Signed   By: Donavan Foil M.D.   On: 10/30/2017 21:00   Dg Shoulder Right Port  Result Date: 10/30/2017 CLINICAL DATA:  Arm pain EXAM: PORTABLE RIGHT SHOULDER COMPARISON:  Chest x-ray 10/30/2017, 08/31/2017 FINDINGS: Limited by positioning. No fracture seen. Slight anterior positioning of the humeral head with respect to glenoid fossa but suspect that this may be related to positioning. Right lung apex clear IMPRESSION: 1. No fracture seen 2. Slight anterior positioning of the humeral head with respect to the glenoid fossa, suspect that this may be related  to positioning. Electronically Signed   By: Donavan Foil M.D.   On: 10/30/2017 23:25   Dg Foot 2 Views Right  Result Date: 10/30/2017 CLINICAL DATA:  Chronic wound to the right foot EXAM: RIGHT FOOT - 2 VIEW COMPARISON:  10/12/2017, 08/26/2017, 07/14/2017 FINDINGS: No acute fracture is seen. Patient is status post amputation of first digit at the level of the mid first metatarsal. Increased bony proliferative changes about the cut margin of the first metatarsal with indistinct appearance of the cortex on the medial side. Vascular calcifications. No gas in the soft tissues IMPRESSION: Status post amputation of  first digit at the level of the mid first metatarsal. Indistinct appearance of the medial cortex of the shaft of the residual metatarsal is concerning for ongoing osteomyelitis. Electronically Signed   By: Donavan Foil M.D.   On: 10/30/2017 21:05    Pending Labs Unresulted Labs (From admission, onward)   Start     Ordered   10/31/17 0500  CBC  Tomorrow morning,   R     10/30/17 2220   10/31/17 0500  Comprehensive metabolic panel  Tomorrow morning,   R     10/30/17 2231   10/30/17 2223  Hemoglobin A1c  Once,   R     10/30/17 2223   10/30/17 2223  HIV antibody  Once,   R     10/30/17 2223   10/30/17 2223  Sedimentation rate  Once,   R     10/30/17 2223   10/30/17 2223  C-reactive protein  Once,   R     10/30/17 2223   10/30/17 2223  Prealbumin  Once,   R     10/30/17 2223   10/30/17 2133  Influenza panel by PCR (type A & B)  (Influenza PCR Panel)  Once,   R     10/30/17 2132   10/30/17 2035  Urine culture  STAT,   STAT    Question:  Patient immune status  Answer:  Normal   10/30/17 2035   10/30/17 2007  Culture, blood (Routine x 2)  BLOOD CULTURE X 2,   STAT     10/30/17 2007      Vitals/Pain Today's Vitals   10/30/17 2005 10/30/17 2008 10/30/17 2133 10/30/17 2319  BP: (!) 176/89   (!) 189/95  Pulse: 91   87  Resp: (!) 22   18  Temp: (!) 101.5 F (38.6 C)     TempSrc: Rectal     SpO2: 100%   100%  Weight:  (!) 317 lb (143.8 kg)    Height:  6' (1.829 m)    PainSc:   5      Isolation Precautions Droplet precaution  Medications Medications  sodium chloride 0.9 % bolus 1,000 mL (0 mLs Intravenous Stopped 10/30/17 2125)    And  sodium chloride 0.9 % bolus 1,000 mL (0 mLs Intravenous Stopped 10/30/17 2238)    And  sodium chloride 0.9 % bolus 1,000 mL (0 mLs Intravenous Hold 10/30/17 2154)    And  sodium chloride 0.9 % bolus 500 mL (0 mLs Intravenous Stopped 10/30/17 2238)  metroNIDAZOLE (FLAGYL) IVPB 500 mg (has no administration in time range)  0.9 %  sodium chloride  infusion (has no administration in time range)  acetaminophen (TYLENOL) tablet 650 mg (has no administration in time range)    Or  acetaminophen (TYLENOL) suppository 650 mg (has no administration in time range)  ondansetron (ZOFRAN) tablet 4 mg (has no administration in  time range)    Or  ondansetron (ZOFRAN) injection 4 mg (has no administration in time range)  heparin injection 5,000 Units (has no administration in time range)  atorvastatin (LIPITOR) tablet 10 mg (has no administration in time range)  metoprolol succinate (TOPROL-XL) 24 hr tablet 25 mg (has no administration in time range)  timolol (TIMOPTIC) 0.5 % ophthalmic solution 1 drop (has no administration in time range)  thiamine tablet 100 mg (has no administration in time range)  pantoprazole (PROTONIX) EC tablet 40 mg (has no administration in time range)  sucralfate (CARAFATE) tablet 1 g (has no administration in time range)  folic acid (FOLVITE) tablet 1 mg (has no administration in time range)  dicyclomine (BENTYL) capsule 10 mg (has no administration in time range)  hydrALAZINE (APRESOLINE) tablet 25 mg (has no administration in time range)  insulin aspart (novoLOG) injection 0-15 Units (has no administration in time range)  insulin glargine (LANTUS) injection 15 Units (has no administration in time range)  potassium chloride SA (K-DUR,KLOR-CON) CR tablet 40 mEq (has no administration in time range)  potassium chloride 10 mEq in 100 mL IVPB (has no administration in time range)  metoCLOPramide (REGLAN) injection 10 mg (has no administration in time range)  HYDROmorphone (DILAUDID) injection 0.5-1 mg (has no administration in time range)  vancomycin (VANCOCIN) IVPB 1000 mg/200 mL premix (has no administration in time range)  aztreonam (AZACTAM) 2 GM IVPB (has no administration in time range)  aztreonam (AZACTAM) 2 g in sodium chloride 0.9 % 100 mL IVPB (0 g Intravenous Stopped 10/30/17 2125)  metroNIDAZOLE (FLAGYL) IVPB 500  mg (0 mg Intravenous Stopped 10/30/17 2231)  acetaminophen (TYLENOL) tablet 1,000 mg (1,000 mg Oral Given 10/30/17 2101)  sodium chloride 0.9 % bolus 1,000 mL (0 mLs Intravenous Stopped 10/30/17 2125)  vancomycin (VANCOCIN) 1,500 mg in sodium chloride 0.9 % 500 mL IVPB (0 mg Intravenous Stopped 10/30/17 2333)  LORazepam (ATIVAN) injection 1 mg (1 mg Intravenous Given 10/30/17 2153)    Mobility walks

## 2017-10-31 NOTE — Progress Notes (Signed)
Inpatient Diabetes Program Recommendations  AACE/ADA: New Consensus Statement on Inpatient Glycemic Control (2015)  Target Ranges:  Prepandial:   less than 140 mg/dL      Peak postprandial:   less than 180 mg/dL (1-2 hours)      Critically ill patients:  140 - 180 mg/dL   Lab Results  Component Value Date   GLUCAP 75 10/31/2017   HGBA1C 6.7 (H) 10/31/2017    Review of Glycemic Control  Diabetes history:Type 2 Outpatient Diabetes medications: 70/30 35 units BID, Januvia Current orders for Inpatient glycemic control: Lantus 15 units daily, Novolog MODERATE TID  Inpatient Diabetes Program Recommendations:    Noted diabetes coordinator consult.  Agree with current insulin orders. CBGs look good.  HgbA1Cis 6.7%.  Will continue to monitor blood sugars while in the hospital.  Harvel Ricks RN BSN CDE Diabetes Coordinator Pager: [phone]  8am-5pm

## 2017-10-31 NOTE — Consult Note (Signed)
Patient name: Timothy Clark MRN: [id number] DOB: 02-08-1971 Sex: male  REASON FOR CONSULT: non healing wound right foot  HPI: Timothy Clark is a 47 y.o. male who developed a wound on his right foot several weeks ago from a pinch from an ACE wrap.  Pt has previously seen Dr Gwenlyn Found for his PAD and had non invasive exam in January of this year prior to undergoing right first toe amputation by Dr Amalia Hailey.  It was thought he had adequate arterial flow for wound healing and underwent uneventful toe amputation.  He presented to the ER last evening with confusion and agitation and fever.  Other medical problems include gastroparesis, diabetes, hypertension all of which have been stable.  He also had abdominal pain last evening but this seems to be improved. [place] right upper quadrant was negative.  No leukocytosis.  Has history of chronic kidney disease creatinine runs 1.4-2.  Past Medical History:  Diagnosis Date  . Chronic abdominal pain   . Esophagitis   . Eye hemorrhage   . Gastritis   . Gastroparesis   . GERD (gastroesophageal reflux disease)   . Glaucoma   . Helicobacter pylori (H. pylori) infection 2016   ERADICATION DOCUMENTED VIA STOOL TEST in AUG 2016 at [place]  . Hiccups   . Hypertension   . Nausea and vomiting    chronic, recurrent  . Type 2 diabetes mellitus with complications (HCC)    diagnosed around age 17   Past Surgical History:  Procedure Laterality Date  . CATARACT EXTRACTION W/PHACO Left 05/19/2016   Procedure: CATARACT EXTRACTION PHACO AND INTRAOCULAR LENS PLACEMENT LEFT EYE;  Surgeon: Tonny Branch, MD;  Location: [facility];  Service: Ophthalmology;  Laterality: Left;  CDE: 7.30  . CATARACT EXTRACTION W/PHACO Right 06/23/2016   Procedure: CATARACT EXTRACTION PHACO AND INTRAOCULAR LENS PLACEMENT RIGHT EYE CDE=9.87;  Surgeon: Tonny Branch, MD;  Location: [facility];  Service: Ophthalmology;  Laterality: Right;  right  . ESOPHAGOGASTRODUODENOSCOPY  2015   Dr. Britta Mccreedy  .  ESOPHAGOGASTRODUODENOSCOPY N/A 10/26/2014   RMR: Distal esophagititis-likely reflux related although an element of pill induced injuruy no exclueded  status post biopsy. Diffusely abnormal gastric mucosa of uncertain signigicane -status post gastric biopsy. Focal area of excoriation in the cardia most consistant with a trauma of heaving.   . ESOPHAGOGASTRODUODENOSCOPY  08/2015   [facility]: mild esophagitis and old gastric contents  . EYE SURGERY  2015   stent placed to left eye  . EYE SURGERY    . INCISION AND DRAINAGE OF WOUND Right 07/16/2017   Procedure: IRRIGATION AND DEBRIDEMENT OF SOFT TISSUE OF ULCERATION RIGHT FOOT;  Surgeon: Caprice Beaver, DPM;  Location: [facility];  Service: Podiatry;  Laterality: Right;    Family History  Problem Relation Age of Onset  . Ovarian cancer Mother   . Heart attack Father   . Cervical cancer Sister   . Diabetes Sister   . Colon cancer Neg Hx     SOCIAL HISTORY: Social History   Socioeconomic History  . Marital status: Single    Spouse name: Not on file  . Number of children: Not on file  . Years of education: Not on file  . Highest education level: Not on file  Occupational History  . Not on file  Social Needs  . Financial resource strain: Not on file  . Food insecurity:    Worry: Not on file    Inability: Not on file  . Transportation needs:  Medical: Not on file    Non-medical: Not on file  Tobacco Use  . Smoking status: Never Smoker  . Smokeless tobacco: Never Used  Substance and Sexual Activity  . Alcohol use: No    Alcohol/week: 0.0 oz  . Drug use: No  . Sexual activity: Yes  Lifestyle  . Physical activity:    Days per week: Not on file    Minutes per session: Not on file  . Stress: Not on file  Relationships  . Social connections:    Talks on phone: Not on file    Gets together: Not on file    Attends religious service: Not on file    Active member of club or organization: Not on file    Attends meetings of clubs  or organizations: Not on file    Relationship status: Not on file  . Intimate partner violence:    Fear of current or ex partner: Not on file    Emotionally abused: Not on file    Physically abused: Not on file    Forced sexual activity: Not on file  Other Topics Concern  . Not on file  Social History Narrative  . Not on file    Allergies  Allergen Reactions  . Donnatal [Pb-Hyoscy-Atropine-Scopol Er] Anaphylaxis    Reaction to GI cocktail  . Fish Allergy Anaphylaxis, Hives and Rash  . Haldol [Haloperidol Lactate] Other (See Comments)    Chest Pain  . Lidocaine Anaphylaxis    Reaction to GI cocktail  . Maalox [Calcium Carbonate Antacid] Anaphylaxis    Reaction to GI cocktail  . Aspirin Hives and Itching  . Bactrim [Sulfamethoxazole-Trimethoprim] Itching  . Keflex [Cephalexin] Hives, Itching and Nausea And Vomiting  . Lisinopril   . Penicillins Hives and Itching    Has patient had a PCN reaction causing immediate rash, facial/tongue/throat swelling, SOB or lightheadedness with hypotension: yes Has patient had a PCN reaction causing severe rash involving mucus membranes or skin necrosis: yes Has patient had a PCN reaction that required hospitalization no Has patient had a PCN reaction occurring within the last 10 years: yes If all of the above answers are "NO", then may proceed with Cephalospor  . Phenergan [Promethazine Hcl] Nausea And Vomiting    Current Facility-Administered Medications  Medication Dose Route Frequency Provider Last Rate Last Dose  . 0.9 %  sodium chloride infusion   Intravenous Continuous Etta Quill, DO 125 mL/hr at 10/31/17 1100    . acetaminophen (TYLENOL) tablet 650 mg  650 mg Oral Q6H PRN Etta Quill, DO       Or  . acetaminophen (TYLENOL) suppository 650 mg  650 mg Rectal Q6H PRN Etta Quill, DO      . atorvastatin (LIPITOR) tablet 10 mg  10 mg Oral QPM Jennette Kettle M, DO      . aztreonam (AZACTAM) 2 g in sodium chloride 0.9 % 100  mL IVPB  2 g Intravenous Q8H Mariel Aloe, MD   Stopped at 10/31/17 0800  . dicyclomine (BENTYL) capsule 10 mg  10 mg Oral TID AC & HS Jennette Kettle M, DO   10 mg at 10/31/17 1345  . feeding supplement (PRO-STAT SUGAR FREE 64) liquid 30 mL  30 mL Oral BID Mariel Aloe, MD   30 mL at 10/31/17 1345  . folic acid (FOLVITE) tablet 1 mg  1 mg Oral Daily Jennette Kettle M, DO   1 mg at 10/31/17 1033  . heparin injection 5,000  Units  5,000 Units Subcutaneous Q8H Etta Quill, DO   5,000 Units at 10/31/17 [phone]  . hydrALAZINE (APRESOLINE) tablet 25 mg  25 mg Oral Q8H Jennette Kettle M, DO   25 mg at 10/31/17 1345  . HYDROmorphone (DILAUDID) injection 0.5-1 mg  0.5-1 mg Intravenous Q4H PRN Etta Quill, DO   1 mg at 10/31/17 0118  . insulin aspart (novoLOG) injection 0-15 Units  0-15 Units Subcutaneous TID WC Etta Quill, DO      . insulin glargine (LANTUS) injection 15 Units  15 Units Subcutaneous BID Etta Quill, DO   15 Units at 10/31/17 1034  . LORazepam (ATIVAN) injection 1 mg  1 mg Intravenous Q6H PRN Etta Quill, DO      . metoCLOPramide (REGLAN) injection 10 mg  10 mg Intravenous Q6H Jennette Kettle M, DO   10 mg at 10/31/17 1345  . metoprolol succinate (TOPROL-XL) 24 hr tablet 25 mg  25 mg Oral Daily Jennette Kettle M, DO   25 mg at 10/31/17 1033  . metroNIDAZOLE (FLAGYL) IVPB 500 mg  500 mg Intravenous Q8H Etta Quill, DO   Stopped at 10/31/17 [phone]  . ondansetron (ZOFRAN) tablet 4 mg  4 mg Oral Q6H PRN Etta Quill, DO       Or  . ondansetron [facility]) injection 4 mg  4 mg Intravenous Q6H PRN Etta Quill, DO      . pantoprazole (PROTONIX) EC tablet 40 mg  40 mg Oral BID AC Jennette Kettle M, DO   40 mg at 10/31/17 1033  . potassium chloride 10 mEq in 100 mL IVPB  10 mEq Intravenous Q1 Hr x 4 Mariel Aloe, MD 100 mL/hr at 10/31/17 1344 10 mEq at 10/31/17 1344  . sodium chloride 0.9 % bolus 1,000 mL  1,000 mL Intravenous Once Long, Wonda Olds, MD   Stopped at  10/30/17 2154  . sucralfate (CARAFATE) tablet 1 g  1 g Oral QID Jennette Kettle M, DO   1 g at 10/31/17 1343  . thiamine (VITAMIN B-1) tablet 100 mg  100 mg Oral Daily Jennette Kettle M, DO   100 mg at 10/31/17 1033  . timolol (TIMOPTIC) 0.5 % ophthalmic solution 1 drop  1 drop Both Eyes BID Jennette Kettle M, DO   1 drop at 10/31/17 1034  . vancomycin (VANCOCIN) 1,500 mg in sodium chloride 0.9 % 500 mL IVPB  1,500 mg Intravenous Q8H Wynell Balloon, RPH 250 mL/hr at 10/31/17 1344 1,500 mg at 10/31/17 1344    ROS:   General:  No weight loss, Fever, chills  Cardiac: No recent episodes of chest pain/pressure, no shortness of breath at rest.  + shortness of breath with exertion.  Denies history of atrial fibrillation or irregular heartbeat  Vascular: No history of rest pain in feet.  No history of claudication.  + history of non-healing ulcer, No history of DVT   Pulmonary: No home oxygen, no productive cough, no hemoptysis,  No asthma or wheezing  Hematologic:No history of hypercoagulable state.  No history of easy bleeding.  No history of anemia  Gastrointestinal: No hematochezia or melena,  + gastroesophageal reflux, no trouble swallowing  Urinary: [ x] chronic Kidney disease, [ ]  on HD - [ ]  MWF or [ ]  TTHS, [ ]  Burning with urination, [ ]  Frequent urination, [ ]  Difficulty urinating;   Skin: No rashes  Psychological: No history of anxiety,  No history of depression   Physical  Examination  Vitals:   10/31/17 0800 10/31/17 1000 10/31/17 1100 10/31/17 1131  BP: (!) 155/89 (!) 182/92 (!) 171/103   Pulse: 76 80 83   Resp: 11 12 15    Temp:    98.6 F (37 C)  TempSrc:    Oral  SpO2: 99% 99% 99%   Weight:      Height:        Body mass index is 42.99 kg/m.  General:  Alert and oriented, no acute distress HEENT: Normal Neck: No bruit or JVD Pulmonary: Clear to auscultation bilaterally Cardiac: Regular Rate and Rhythm  Abdomen: Soft, non-tender, non-distended, no  mass,obese Skin: No rash, 6 x 3 cm less than 2 mm depth linear wound extending across the dorsum of the right foot with some edema, no drainage, necrotic eschar on 50% other half granulating.  Essentially healed right first toe amp. Thickened lichenification of pretibial skin bilaterally Extremity Pulses:  2+ radial, brachial, femoral, absent popliteal dorsalis pedis, posterior tibial pulses bilaterally Musculoskeletal: bilateral charcot deformity of feet dorsal edema of foot Neurologic: Upper and lower extremity motor 5/5 and symmetric  DATA:  ABI January 2019 calcified vessels, triphasic waveforms, toe pressure greater than 100 bilaterally  ASSESSMENT:  Slowly healing wound right foot.  No evidence of abscess.  Healing toe amp site.  Pt with known history of renal dysfunction so will be high risk for arteriogram.  Based on his toe pressure he should have adequate blood flow for healing but wound is fairly large and metabolic demands are significant.  He also has a component of protein calorie malnutrition which will also slow wound healing as well as his elevated a1c   PLAN:  1. Needs better long term control of his diabetes. A1C 7 on 3 separate occasions over 4 months  2. Protein calorie malnutrition would give protein supplements and needs some nutrition education  3.  Will get arterial duplex to make sure no large vessel obstruction although I suspect this is more related to neuropathy.  If duplex is equivocal will get arteriogram sometime next week  4.  Would start Santyl once daily on wound for foot   Ruta Hinds, MD [facility]: [phone] Pager: [phone]

## 2017-10-31 NOTE — Progress Notes (Signed)
PROGRESS NOTE    Timothy Clark  Timothy Clark:[id number] DOB: 1971/05/12 DOA: 10/30/2017 PCP: Rosalee Kaufman, PA-C   Brief Narrative: Timothy Clark is a 47 y.o. male with medical history significant of DM, HTN, GERD, Gastroparesis, and blindness. He presented secondary to dyspnea, abdominal pain. He was found to have a fever with evidence of sepsis secondary to osteomyelitis. He was started on broad spectrum antibiotics. Vascular surgery consulted.   Assessment & Plan:   Principal Problem:   DIABETIC FOOT ULCER with Osteomyelitis Active Problems:   Diabetes mellitus type 2 with complications, uncontrolled ([place])   Essential hypertension   Diabetic foot infection ([place])   Sepsis ([place])   CKD stage 3 due to type 2 diabetes mellitus (HCC)   Gangrene of right foot (HCC)   ACE inhibitor-aggravated angioedema, initial encounter   Right shoulder pain   Restlessness and agitation   Sepsis Secondary to osteomyelitis of right foot. Patient is s/p right 1st toe amputation by podiatry. Sepsis physiology resolved with IV fluids and antibiotics. -Continue antibiotics -Vascular surgery consult -Blood cultures pending  Osteomyelitis Gangrene Per patient, he states he developed a wound after some ACE bandage wrap cut into his skin and has since had an issue with this non-healing wound. He is s/p amputation of right great toe for diabetic ulcer/osteomyelitis in January -Management above  RUQ pain Chronic. No issues this morning. Ultrasound unremarkable.  Right shoulder pain X-ray negative.  Agitation Appears resolved with treatment of infection  Hypertension Poorly controlled blood pressure -Continue metoprolol -Increase to hydralazine 50 mg TID -Add hydralazine 10 mg IV prn SBP >180 mmHg  Mouth swelling Concern for angioedema. Persistent over the last two weeks. -monitor for worsening or compromised airway  CKD stage III At baseline  Diabetes mellitus, type 2 -Continue  Lantus and SSI   DVT prophylaxis: Heparin Code Status:   Code Status: Full Code Family Communication: None at bedside Disposition Plan: Discharge pending management of osteomyelitis/gangrene and sepsis   Consultants:   Vascular surgery  Procedures:   None  Antimicrobials:  Vancomycin (3/22>>  Aztreonam (3/22>>  Flagyl (3/22>>   Subjective: No issues this morning.   Objective: Vitals:   10/31/17 1200 10/31/17 1300 10/31/17 1400 10/31/17 1529  BP: (!) 168/95 (!) 173/101 (!) 195/104   Pulse: 78 76 84   Resp: 14 18 18    Temp:    98.4 F (36.9 C)  TempSrc:    Oral  SpO2: 100% 98% 100%   Weight:      Height:        Intake/Output Summary (Last 24 hours) at 10/31/2017 1641 Last data filed at 10/31/2017 1634 Gross per 24 hour  Intake 7646.25 ml  Output 2050 ml  Net 5596.25 ml   Filed Weights   10/30/17 2008  Weight: (!) 143.8 kg (317 lb)    Examination:  General exam: Appears calm and comfortable Respiratory system: Clear to auscultation. Respiratory effort normal. Cardiovascular system: S1 & S2 heard, RRR. No murmurs, rubs, gallops or clicks. Diminished DP pulses bilaterally Gastrointestinal system: Abdomen is nondistended, soft and nontender. No organomegaly or masses felt. Normal bowel sounds heard. Central nervous system: Alert and oriented to person, place and time. No focal neurological deficits. Extremities: No edema. No calf tenderness Skin: right medial aspect of foot with large eschar Psychiatry: Judgement and insight appear normal. Mood & affect appropriate.     Data Reviewed: I have personally reviewed following labs and imaging studies  CBC: Recent Labs  Lab 10/30/17 2020  10/31/17 0311  WBC 9.3 8.1  NEUTROABS 7.6  --   HGB 9.4* 8.5*  HCT 27.3* 24.8*  MCV 88.6 89.5  PLT 169 161   Basic Metabolic Panel: Recent Labs  Lab 10/30/17 2020 10/31/17 0630  NA 142 138  K 2.7* 3.0*  CL 105 103  CO2 25 25  GLUCOSE 159* 87  BUN 8 6    CREATININE 1.51* 1.37*  CALCIUM 8.5* 7.5*   GFR: Estimated Creatinine Clearance: 98.1 mL/min (A) (by C-G formula based on SCr of 1.37 mg/dL (H)). Liver Function Tests: Recent Labs  Lab 10/30/17 2020 10/31/17 0630  AST 25 20  ALT 10* 7*  ALKPHOS 59 54  BILITOT 0.8 0.8  PROT 6.0* 5.1*  ALBUMIN 2.7* 2.2*   No results for input(s): LIPASE, AMYLASE in the last 168 hours. No results for input(s): AMMONIA in the last 168 hours. Coagulation Profile: Recent Labs  Lab 10/30/17 2020  INR 1.25   Cardiac Enzymes: No results for input(s): CKTOTAL, CKMB, CKMBINDEX, TROPONINI in the last 168 hours. BNP (last 3 results) No results for input(s): PROBNP in the last 8760 hours. HbA1C: Recent Labs    10/31/17 0311  HGBA1C 6.7*   CBG: Recent Labs  Lab 10/30/17 1957 10/31/17 0914 10/31/17 1347  GLUCAP 150* 75 100*   Lipid Profile: No results for input(s): CHOL, HDL, LDLCALC, TRIG, CHOLHDL, LDLDIRECT in the last 72 hours. Thyroid Function Tests: No results for input(s): TSH, T4TOTAL, FREET4, T3FREE, THYROIDAB in the last 72 hours. Anemia Panel: No results for input(s): VITAMINB12, FOLATE, FERRITIN, TIBC, IRON, RETICCTPCT in the last 72 hours. Sepsis Labs: Recent Labs  Lab 10/30/17 2031 10/31/17 0311 10/31/17 0630  LATICACIDVEN 4.12* 1.3 1.0    Recent Results (from the past 240 hour(s))  Culture, blood (Routine x 2)     Status: None (Preliminary result)   Collection Time: 10/30/17  8:20 PM  Result Value Ref Range Status   Specimen Description   Final    BLOOD RIGHT ARM Performed at [facility] [address], [place], [place] [address]    Special Requests   Final    BOTTLES DRAWN AEROBIC AND ANAEROBIC Blood Culture results may not be optimal due to an excessive volume of blood received in culture bottles Performed at [facility] [address] [place], [place] [address]    Culture   Final    NO GROWTH < 24 HOURS Performed at  [facility] [address], [place], [place] [address]    Report Status PENDING  Incomplete  Culture, blood (Routine x 2)     Status: None (Preliminary result)   Collection Time: 10/30/17  9:21 PM  Result Value Ref Range Status   Specimen Description   Final    BLOOD LEFT FOREARM Performed at [facility] [address], [place],  [address]    Special Requests   Final    BOTTLES DRAWN AEROBIC AND ANAEROBIC Blood Culture adequate volume   Culture   Final    NO GROWTH < 24 HOURS Performed at [facility], [place] [address] [place],  [address]    Report Status PENDING  Incomplete  MRSA PCR Screening     Status: None   Collection Time: 10/31/17  2:54 AM  Result Value Ref Range Status   MRSA by PCR NEGATIVE NEGATIVE Final    Comment:        The GeneXpert MRSA Assay (FDA approved for  NASAL specimens only), is one component of a comprehensive MRSA colonization surveillance program. It is not intended to diagnose MRSA infection nor to guide or monitor treatment for MRSA infections. Performed at [facility], [place] [address] [place], [place] [address]          Radiology Studies: Dg Chest 2 View  Result Date: 10/30/2017 CLINICAL DATA:  Altered mental status with fever EXAM: CHEST - 2 VIEW COMPARISON:  08/31/2017 FINDINGS: Low lung volumes. No consolidation or effusion. Limited lateral view due to overlying soft tissues. Cardiomediastinal silhouette within normal limits. No pneumothorax. IMPRESSION: Low lung volumes.  No focal pulmonary infiltrate. Electronically Signed   By: Donavan Foil M.D.   On: 10/30/2017 21:00   Dg Shoulder Right Port  Result Date: 10/30/2017 CLINICAL DATA:  Arm pain EXAM: PORTABLE RIGHT SHOULDER COMPARISON:  Chest x-ray 10/30/2017, 08/31/2017 FINDINGS: Limited by positioning. No fracture seen. Slight anterior positioning of the humeral head with respect to glenoid fossa but suspect that this may be related  to positioning. Right lung apex clear IMPRESSION: 1. No fracture seen 2. Slight anterior positioning of the humeral head with respect to the glenoid fossa, suspect that this may be related to positioning. Electronically Signed   By: Donavan Foil M.D.   On: 10/30/2017 23:25   Dg Foot 2 Views Right  Result Date: 10/30/2017 CLINICAL DATA:  Chronic wound to the right foot EXAM: RIGHT FOOT - 2 VIEW COMPARISON:  10/12/2017, 08/26/2017, 07/14/2017 FINDINGS: No acute fracture is seen. Patient is status post amputation of first digit at the level of the mid first metatarsal. Increased bony proliferative changes about the cut margin of the first metatarsal with indistinct appearance of the cortex on the medial side. Vascular calcifications. No gas in the soft tissues IMPRESSION: Status post amputation of first digit at the level of the mid first metatarsal. Indistinct appearance of the medial cortex of the shaft of the residual metatarsal is concerning for ongoing osteomyelitis. Electronically Signed   By: Donavan Foil M.D.   On: 10/30/2017 21:05   US Abdomen Limited Ruq  Result Date: 10/31/2017 CLINICAL DATA:  Initial evaluation for acute right upper quadrant pain EXAM: ULTRASOUND ABDOMEN LIMITED RIGHT UPPER QUADRANT COMPARISON:  Prior CT from 09/15/2017. FINDINGS: Gallbladder: No gallstones or wall thickening visualized. No sonographic Murphy sign noted by sonographer. Common bile duct: Diameter: 3.7 mm Liver: No focal lesion identified. Within normal limits in parenchymal echogenicity. Portal vein is patent on color Doppler imaging with normal direction of blood flow towards the liver. IMPRESSION: Normal right upper quadrant ultrasound. Electronically Signed   By: Jeannine Boga M.D.   On: 10/31/2017 04:25        Scheduled Meds: . atorvastatin  10 mg Oral QPM  . dicyclomine  10 mg Oral TID AC & HS  . feeding supplement (PRO-STAT SUGAR FREE 64)  30 mL Oral BID  . folic acid  1 mg Oral Daily  .  heparin  5,000 Units Subcutaneous Q8H  . hydrALAZINE  25 mg Oral Q8H  . insulin aspart  0-15 Units Subcutaneous TID WC  . insulin glargine  15 Units Subcutaneous BID  . metoCLOPramide (REGLAN) injection  10 mg Intravenous Q6H  . metoprolol succinate  25 mg Oral Daily  . pantoprazole  40 mg Oral BID AC  . sucralfate  1 g Oral QID  . thiamine  100 mg Oral Daily  . timolol  1 drop Both Eyes BID   Continuous Infusions: . sodium  chloride 125 mL/hr at 10/31/17 1400  . aztreonam Stopped (10/31/17 0800)  . metronidazole Stopped (10/31/17 0716)  . sodium chloride Stopped (10/30/17 2154)  . vancomycin 1,500 mg (10/31/17 1344)     LOS: 1 day     Cordelia Poche, MD [facility] 10/31/2017, 4:41 PM Pager: [phone]  If 7PM-7AM, please contact night-coverage [email or web address] Password [facility] 10/31/2017, 4:41 PM

## 2017-10-31 NOTE — Plan of Care (Signed)

## 2017-10-31 NOTE — Progress Notes (Signed)
Nursing Note: Notified by patient @ 1730 that he felt agitated and restless "like I'm about to have a panic attack."  In report patient had similar symptoms yesterday evening at [facility].  Administered 1mg  IV ativan (c/o agitation) and 1mg  IV Dilaudid for pain (c/o headache).  Called sister Mortimer Fries and got voice mail.  Called sister Inez Catalina and explained current situation.  At 1815 during dinner patient reports "feeling better."

## 2017-10-31 NOTE — Progress Notes (Signed)
VASCULAR LAB PRELIMINARY  PRELIMINARY  PRELIMINARY  PRELIMINARY  Right lower extremity arterial duplex completed.    Preliminary report:  There is no obvious evidence of significant stenosis throughout the right lower extremity arterial system.  Study was technically difficult secondary to extreme swelling.  Jontavius Rabalais, RVT 10/31/2017, 3:48 PM

## 2017-10-31 NOTE — Care Management Note (Signed)
Case Management Note  Patient Details  Name: TAJAE RYBICKI MRN: [id number] Date of Birth: 1971/02/27  Subjective/Objective:                 Patient admitted form home w family for Sepsis - likely due to R necrotic foot.  Family has been changing dressing at home s/p partial first ray amputation right. DOS: 08/20/17. Patient is blind. Was DC'd to [facility] 2/16 for suicidal ideation, patient has 4 admissions in the last 6 months. Patient will need HH support upon DC, possibly ST SNF depending on treatment plan and potential for further amputation.    Action/Plan:   Expected Discharge Date:                  Expected Discharge Plan:  [place]  In-House Referral:     Discharge planning Services  CM Consult  Post Acute Care Choice:    Choice offered to:     DME Arranged:    DME Agency:     HH Arranged:    [place] Agency:     Status of Service:  In process, will continue to follow  If discussed at Long Length of Stay Meetings, dates discussed:    Additional Comments:  Carles Collet, RN 10/31/2017, 8:29 AM

## 2017-10-31 NOTE — Plan of Care (Signed)
problem: Education: Goal: Ability to describe self-care measures that may prevent or decrease complications (Diabetes Survival Skills Education) will improve Outcome: Progressing   Problem: Skin Integrity: Goal: Risk for impaired skin integrity will decrease Outcome: Progressing

## 2017-11-01 LAB — CBC
HCT: 27.9 % — ABNORMAL LOW (ref 39.0–52.0)
Hemoglobin: 9.4 g/dL — ABNORMAL LOW (ref 13.0–17.0)
MCH: 30.9 pg (ref 26.0–34.0)
MCHC: 33.7 g/dL (ref 30.0–36.0)
MCV: 91.8 fL (ref 78.0–100.0)
Platelets: 142 10*3/uL — ABNORMAL LOW (ref 150–400)
RBC: 3.04 MIL/uL — ABNORMAL LOW (ref 4.22–5.81)
RDW: 14.3 % (ref 11.5–15.5)
WBC: 5.9 10*3/uL (ref 4.0–10.5)

## 2017-11-01 LAB — BASIC METABOLIC PANEL
Anion gap: 6 (ref 5–15)
BUN: 8 mg/dL (ref 6–20)
CO2: 22 mmol/L (ref 22–32)
Calcium: 7.6 mg/dL — ABNORMAL LOW (ref 8.9–10.3)
Chloride: 113 mmol/L — ABNORMAL HIGH (ref 101–111)
Creatinine, Ser: 1.36 mg/dL — ABNORMAL HIGH (ref 0.61–1.24)
GFR calc Af Amer: >60 mL/min (ref >60)
GFR calc non Af Amer: >60 mL/min (ref >60)
Glucose, Bld: 124 mg/dL — ABNORMAL HIGH (ref 65–99)
Potassium: 2.8 mmol/L — ABNORMAL LOW (ref 3.5–5.1)
Sodium: 141 mmol/L (ref 135–145)

## 2017-11-01 LAB — GLUCOSE, CAPILLARY
Glucose-Capillary: 115 mg/dL — ABNORMAL HIGH (ref 65–99)
Glucose-Capillary: 88 mg/dL (ref 65–99)
Glucose-Capillary: 86 mg/dL (ref 65–99)
Glucose-Capillary: 103 mg/dL — ABNORMAL HIGH (ref 65–99)

## 2017-11-01 LAB — POTASSIUM: Potassium: 3.2 mmol/L — ABNORMAL LOW (ref 3.5–5.1)

## 2017-11-01 LAB — URINE CULTURE
Culture: 50000 — AB
Special Requests: NORMAL

## 2017-11-01 LAB — MAGNESIUM: Magnesium: 1.7 mg/dL (ref 1.7–2.4)

## 2017-11-01 LAB — VANCOMYCIN, TROUGH: Vancomycin Tr: 35 ug/mL (ref 15–20)

## 2017-11-01 MED ORDER — VANCOMYCIN HCL IN DEXTROSE 750-5 MG/150ML-% IV SOLN
750.0000 mg | Freq: Three times a day (TID) | INTRAVENOUS | Status: DC
  Administered 2017-11-02 – 2017-11-04 (×7): 750 mg via INTRAVENOUS
  Filled 2017-11-01 (×9): qty 150

## 2017-11-01 MED ORDER — METOCLOPRAMIDE HCL 10 MG PO TABS
10.0000 mg | ORAL_TABLET | Freq: Three times a day (TID) | ORAL | Status: DC
  Administered 2017-11-01 – 2017-11-06 (×16): 10 mg via ORAL
  Filled 2017-11-01 (×16): qty 1

## 2017-11-01 MED ORDER — BRIMONIDINE TARTRATE 0.15 % OP SOLN
1.0000 [drp] | Freq: Three times a day (TID) | OPHTHALMIC | Status: DC
  Administered 2017-11-01 – 2017-11-06 (×14): 1 [drp] via OPHTHALMIC
  Filled 2017-11-01: qty 5

## 2017-11-01 MED ORDER — CLONIDINE HCL 0.1 MG PO TABS
0.1000 mg | ORAL_TABLET | Freq: Two times a day (BID) | ORAL | Status: DC
Start: 2017-11-01 — End: 2017-11-06
  Administered 2017-11-01 – 2017-11-06 (×11): 0.1 mg via ORAL
  Filled 2017-11-01 (×11): qty 1

## 2017-11-01 MED ORDER — COLLAGENASE 250 UNIT/GM EX OINT
TOPICAL_OINTMENT | Freq: Every day | CUTANEOUS | Status: DC
  Administered 2017-11-01: 1 via TOPICAL
  Administered 2017-11-02: 10:00:00 via TOPICAL
  Administered 2017-11-03: 1 via TOPICAL
  Administered 2017-11-05: 08:00:00 via TOPICAL
  Filled 2017-11-01: qty 30

## 2017-11-01 MED ORDER — POTASSIUM CHLORIDE CRYS ER 20 MEQ PO TBCR
40.0000 meq | EXTENDED_RELEASE_TABLET | ORAL | Status: AC
  Administered 2017-11-01 (×2): 40 meq via ORAL
  Filled 2017-11-01 (×2): qty 2

## 2017-11-01 NOTE — Progress Notes (Signed)
Pt transferred per bed to [place] per RN and NT. Family aware of transfer - 2 sisters here. Pt has cell phone and charger with him.

## 2017-11-01 NOTE — Progress Notes (Signed)
Called report to [employer] to [employer]

## 2017-11-01 NOTE — Progress Notes (Signed)
Text page to Dr Lonny Prude - made aware K is 2.8 and that pt's PO liquid intake is good. MD says he will order PO potassium replacement.

## 2017-11-01 NOTE — Progress Notes (Signed)
1245 received pt from 5M via bed; pt A&O x 4, dressings in place right and left feet, bilateral RLE noted

## 2017-11-01 NOTE — Progress Notes (Signed)
PROGRESS NOTE    Timothy Clark  LPF:[id number] DOB: [birth year] DOA: 10/30/2017 PCP: Rosalee Kaufman, PA-C   Brief Narrative: Timothy Clark is a 47 y.o. male with medical history significant of DM, HTN, GERD, Gastroparesis, and blindness. He presented secondary to dyspnea, abdominal pain. He was found to have a fever with evidence of sepsis secondary to osteomyelitis. He was started on broad spectrum antibiotics. Vascular surgery consulted.   Assessment & Plan:   Principal Problem:   DIABETIC FOOT ULCER with Osteomyelitis Active Problems:   Diabetes mellitus type 2 with complications, uncontrolled ([place])   Essential hypertension   Diabetic foot infection ([place])   Sepsis ([place])   CKD stage 3 due to type 2 diabetes mellitus (H[facility])   Gangrene of right foot (H[facility])   ACE inhibitor-aggravated angioedema, initial encounter   Right shoulder pain   Restlessness and agitation   Sepsis Secondary to osteomyelitis of right foot per x-ray. Patient is s/p right 1st toe amputation by podiatry. Sepsis physiology resolved with IV fluids and antibiotics. -Continue antibiotics -Vascular surgery recommendations -Wound care -Blood cultures pending (no growth 3/23)  Osteomyelitis Gangrene Per patient, he states he developed a wound after some ACE bandage wrap cut into his skin and has since had an issue with this non-healing wound. He is s/p amputation of right great toe for diabetic ulcer/osteomyelitis in January -Management above  RUQ pain Chronic. No issues this morning. Ultrasound unremarkable.  Right shoulder pain X-ray negative.  Agitation Some agitation yesterday which improved with Ativan. Patient reports this as a symptom.  Hypertension Poorly controlled blood pressure but better. -Continue metoprolol -Continue hydralazine 50 mg TID -Continue hydralazine 10 mg IV prn SBP >180 mmHg  Mouth swelling Concern for angioedema. Persistent over the last two weeks. Improved  today. -monitor for worsening or compromised airway -Discontinue lisinopril on discharge  CKD stage III At baseline  Diabetes mellitus, type 2 -Continue Lantus and SSI   DVT prophylaxis: Heparin Code Status:   Code Status: Full Code Family Communication: None at bedside Disposition Plan: Discharge pending management of osteomyelitis/gangrene and sepsis   Consultants:   Vascular surgery  Procedures:   None  Antimicrobials:  Vancomycin (3/22>>  Aztreonam (3/22>>  Flagyl (3/22>>   Subjective: Felt agitated yesterday. No issues today.  Objective: Vitals:   10/31/17 2206 11/01/17 0030 11/01/17 0340 11/01/17 0528  BP: (!) 172/96   (!) 186/103  Pulse:      Resp:      Temp:  98.2 F (36.8 C) 98.6 F (37 C)   TempSrc:  Oral Oral   SpO2:      Weight:      Height:        Intake/Output Summary (Last 24 hours) at 11/01/2017 0736 Last data filed at 11/01/2017 0700 Gross per 24 hour  Intake 6225 ml  Output 2825 ml  Net 3400 ml   Filed Weights   10/30/17 2008  Weight: (!) 143.8 kg (317 lb)    Examination:  General exam: Appears calm and comfortable Respiratory: Clear to auscultation bilaterally. Unlabored work of breathing. No wheezing or rales. Cardiovascular system: Regular rate and rhythm. Normal S1 and S2. No heart murmurs present. No extra heart sounds Gastrointestinal system: Soft, non-tender, non-distended but obese, no guarding, no rebound, no masses felt Central nervous system: alert, oriented Extremities: Pitting edema bilaterally. No calf tenderness Skin: right foot with clean/dry dressing Psychiatry: Judgement and insight appear normal. Mood & affect appropriate.     Data Reviewed: I have  personally reviewed following labs and imaging studies  CBC: Recent Labs  Lab 10/30/17 2020 10/31/17 0311  WBC 9.3 8.1  NEUTROABS 7.6  --   HGB 9.4* 8.5*  HCT 27.3* 24.8*  MCV 88.6 89.5  PLT 169 299   Basic Metabolic Panel: Recent Labs  Lab  10/30/17 2020 10/31/17 0630  NA 142 138  K 2.7* 3.0*  CL 105 103  CO2 25 25  GLUCOSE 159* 87  BUN 8 6  CREATININE 1.51* 1.37*  CALCIUM 8.5* 7.5*   GFR: Estimated Creatinine Clearance: 98.1 mL/min (A) (by C-G formula based on SCr of 1.37 mg/dL (H)). Liver Function Tests: Recent Labs  Lab 10/30/17 2020 10/31/17 0630  AST 25 20  ALT 10* 7*  ALKPHOS 59 54  BILITOT 0.8 0.8  PROT 6.0* 5.1*  ALBUMIN 2.7* 2.2*   No results for input(s): LIPASE, AMYLASE in the last 168 hours. No results for input(s): AMMONIA in the last 168 hours. Coagulation Profile: Recent Labs  Lab 10/30/17 2020  INR 1.25   Cardiac Enzymes: No results for input(s): CKTOTAL, CKMB, CKMBINDEX, TROPONINI in the last 168 hours. BNP (last 3 results) No results for input(s): PROBNP in the last 8760 hours. HbA1C: Recent Labs    10/31/17 0311  HGBA1C 6.7*   CBG: Recent Labs  Lab 10/30/17 1957 10/31/17 0914 10/31/17 1347 10/31/17 1811 10/31/17 2300  GLUCAP 150* 75 100* 103* 111*   Lipid Profile: No results for input(s): CHOL, HDL, LDLCALC, TRIG, CHOLHDL, LDLDIRECT in the last 72 hours. Thyroid Function Tests: No results for input(s): TSH, T4TOTAL, FREET4, T3FREE, THYROIDAB in the last 72 hours. Anemia Panel: No results for input(s): VITAMINB12, FOLATE, FERRITIN, TIBC, IRON, RETICCTPCT in the last 72 hours. Sepsis Labs: Recent Labs  Lab 10/30/17 2031 10/31/17 0311 10/31/17 0630  LATICACIDVEN 4.12* 1.3 1.0    Recent Results (from the past 240 hour(s))  Culture, blood (Routine x 2)     Status: None (Preliminary result)   Collection Time: 10/30/17  8:20 PM  Result Value Ref Range Status   Specimen Description   Final    BLOOD RIGHT ARM Performed at [facility] [address], [place], [place] [address]    Special Requests   Final    BOTTLES DRAWN AEROBIC AND ANAEROBIC Blood Culture results may not be optimal due to an excessive volume of blood received in culture  bottles Performed at [facility] [address], [place], [place] [address]    Culture   Final    NO GROWTH < 24 HOURS Performed at [facility] [address] [place], [place] [address]    Report Status PENDING  Incomplete  Culture, blood (Routine x 2)     Status: None (Preliminary result)   Collection Time: 10/30/17  9:21 PM  Result Value Ref Range Status   Specimen Description   Final    BLOOD LEFT FOREARM Performed at [facility] [address], [place], [place] [address]    Special Requests   Final    BOTTLES DRAWN AEROBIC AND ANAEROBIC Blood Culture adequate volume   Culture   Final    NO GROWTH < 24 HOURS Performed at [facility], [place] [address], [place], [place] [address]    Report Status PENDING  Incomplete  MRSA PCR Screening     Status: None   Collection Time: 10/31/17  2:54 AM  Result Value Ref Range Status   MRSA by PCR NEGATIVE  NEGATIVE Final    Comment:        The GeneXpert MRSA Assay (FDA approved for NASAL specimens only), is one component of a comprehensive MRSA colonization surveillance program. It is not intended to diagnose MRSA infection nor to guide or monitor treatment for MRSA infections. Performed at [facility], [place] [address] [place], [place] [address]          Radiology Studies: Dg Chest 2 View  Result Date: 10/30/2017 CLINICAL DATA:  Altered mental status with fever EXAM: CHEST - 2 VIEW COMPARISON:  08/31/2017 FINDINGS: Low lung volumes. No consolidation or effusion. Limited lateral view due to overlying soft tissues. Cardiomediastinal silhouette within normal limits. No pneumothorax. IMPRESSION: Low lung volumes.  No focal pulmonary infiltrate. Electronically Signed   By: Donavan Foil M.D.   On: 10/30/2017 21:00   Dg Shoulder Right Port  Result Date: 10/30/2017 CLINICAL DATA:  Arm pain EXAM: PORTABLE RIGHT SHOULDER COMPARISON:  Chest x-ray 10/30/2017, 08/31/2017  FINDINGS: Limited by positioning. No fracture seen. Slight anterior positioning of the humeral head with respect to glenoid fossa but suspect that this may be related to positioning. Right lung apex clear IMPRESSION: 1. No fracture seen 2. Slight anterior positioning of the humeral head with respect to the glenoid fossa, suspect that this may be related to positioning. Electronically Signed   By: Donavan Foil M.D.   On: 10/30/2017 23:25   Dg Foot 2 Views Right  Result Date: 10/30/2017 CLINICAL DATA:  Chronic wound to the right foot EXAM: RIGHT FOOT - 2 VIEW COMPARISON:  10/12/2017, 08/26/2017, 07/14/2017 FINDINGS: No acute fracture is seen. Patient is status post amputation of first digit at the level of the mid first metatarsal. Increased bony proliferative changes about the cut margin of the first metatarsal with indistinct appearance of the cortex on the medial side. Vascular calcifications. No gas in the soft tissues IMPRESSION: Status post amputation of first digit at the level of the mid first metatarsal. Indistinct appearance of the medial cortex of the shaft of the residual metatarsal is concerning for ongoing osteomyelitis. Electronically Signed   By: Donavan Foil M.D.   On: 10/30/2017 21:05   US Abdomen Limited Ruq  Result Date: 10/31/2017 CLINICAL DATA:  Initial evaluation for acute right upper quadrant pain EXAM: ULTRASOUND ABDOMEN LIMITED RIGHT UPPER QUADRANT COMPARISON:  Prior CT from 09/15/2017. FINDINGS: Gallbladder: No gallstones or wall thickening visualized. No sonographic Murphy sign noted by sonographer. Common bile duct: Diameter: 3.7 mm Liver: No focal lesion identified. Within normal limits in parenchymal echogenicity. Portal vein is patent on color Doppler imaging with normal direction of blood flow towards the liver. IMPRESSION: Normal right upper quadrant ultrasound. Electronically Signed   By: Jeannine Boga M.D.   On: 10/31/2017 04:25        Scheduled Meds: .  atorvastatin  10 mg Oral QPM  . collagenase   Topical Daily  . dicyclomine  10 mg Oral TID AC & HS  . feeding supplement (PRO-STAT SUGAR FREE 64)  30 mL Oral BID  . folic acid  1 mg Oral Daily  . heparin  5,000 Units Subcutaneous Q8H  . hydrALAZINE  50 mg Oral Q8H  . insulin aspart  0-15 Units Subcutaneous TID WC  . insulin glargine  15 Units Subcutaneous BID  . metoCLOPramide  10 mg Oral TID AC  . metoprolol succinate  25 mg Oral Daily  . pantoprazole  40 mg Oral BID AC  . sucralfate  1  g Oral QID  . thiamine  100 mg Oral Daily  . timolol  1 drop Both Eyes BID   Continuous Infusions: . sodium chloride 125 mL/hr at 10/31/17 2000  . aztreonam Stopped (11/01/17 0554)  . metronidazole 500 mg (11/01/17 0700)  . sodium chloride Stopped (10/30/17 2154)  . vancomycin 1,500 mg (11/01/17 0700)     LOS: 2 days     Cordelia Poche, MD [facility] 11/01/2017, 7:36 AM Pager: [phone]  If 7PM-7AM, please contact night-coverage [email or web address] Password [facility] 11/01/2017, 7:36 AM

## 2017-11-01 NOTE — Consult Note (Signed)
Duplex of right leg shows no significant arterial obstruction He should have adequate perfusion for healing This wound is neuropathic in nature.  If wound deteriorates will need BKA Local wound care for now as I do not think he is at that point currently  Will sign off  Ruta Hinds, MD [facility]: [phone] Pager: [phone]

## 2017-11-01 NOTE — Progress Notes (Signed)
Pt spoke to sister on the phone - called RN into room starting to hyperventilate says he started thinking about everything about his leg and it's sore Pt reminded that vascular surgeon came in and said that he seemed to have some decent blood flow to foot and will sign off on his case for now. Pt says he believes he could have a panic attack - started  hyperventilating more. Medicated with Dilaudid 1 mg IV for c/o neck and chest tightness and Ativan 1 mg IV for anxiety. Within 1/2 hr pt is much more relaxed and says he feels better.

## 2017-11-01 NOTE — Progress Notes (Signed)
Pharmacy Antibiotic Note  Timothy Clark is a 47 y.o. male admitted on 10/30/2017 with sepsis secondary to osteomyelitis.  Pharmacy has been consulted for Vancocin and Azactam dosing.  Vanc trough is above goal  Plan: Change vancomycin to 750 IV every 8 hours for calculated trough ~17.  Goal trough 15-20 mcg/mL.  Continue aztreonam 2g every 8 hours.  Height: 6' (182.9 cm) Weight: (!) 324 lb 1.2 oz (147 kg) IBW/kg (Calculated) : 77.6  Temp (24hrs), Avg:98.8 F (37.1 C), Min:98.2 F (36.8 C), Max:99.6 F (37.6 C)  Recent Labs  Lab 10/30/17 2020 10/30/17 2031 10/31/17 0311 10/31/17 0630 11/01/17 0731 11/01/17 2129  WBC 9.3  --  8.1  --  5.9  --   CREATININE 1.51*  --   --  1.37* 1.36*  --   LATICACIDVEN  --  4.12* 1.3 1.0  --   --   VANCOTROUGH  --   --   --   --   --  35*    Estimated Creatinine Clearance: 100.1 mL/min (A) (by C-G formula based on SCr of 1.36 mg/dL (H)).    Allergies  Allergen Reactions  . Donnatal [Pb-Hyoscy-Atropine-Scopol Er] Anaphylaxis    Reaction to GI cocktail  . Fish Allergy Anaphylaxis, Hives and Rash  . Haldol [Haloperidol Lactate] Other (See Comments)    Chest Pain  . Lidocaine Anaphylaxis    Reaction to GI cocktail  . Maalox [Calcium Carbonate Antacid] Anaphylaxis    Reaction to GI cocktail  . Lisinopril Swelling  . Aspirin Hives and Itching  . Bactrim [Sulfamethoxazole-Trimethoprim] Itching  . Keflex [Cephalexin] Hives, Itching and Nausea And Vomiting  . Penicillins Hives and Itching    Has patient had a PCN reaction causing immediate rash, facial/tongue/throat swelling, SOB or lightheadedness with hypotension: yes Has patient had a PCN reaction causing severe rash involving mucus membranes or skin necrosis: yes Has patient had a PCN reaction that required hospitalization no Has patient had a PCN reaction occurring within the last 10 years: yes If all of the above answers are "NO", then may proceed with Cephalospor  . Phenergan  [Promethazine Hcl] Nausea And Vomiting    Antimicrobials this admission: Azactam 3/22 >>  Vanc 3/22 >>  Flagyl 3/22 >>   Dose adjustments this admission: Vanc 1500mg  Q8H > trough 34 > 750mg  Q8H  Microbiology results: 3/22 BCx - NGTD 3/22 UCx - negative 3/23 MRSA PCR - negative  Thank you for allowing pharmacy to be a part of this patient's care.  Wynona Neat, PharmD, BCPS  11/01/2017 11:52 PM

## 2017-11-01 NOTE — Progress Notes (Signed)
Nursing Note: Notified by [facility]M RN At beside that patient has low potassium on morning labs.  Dr Teryl Lucy has been called and this is currently being addressed.  But, no orders as of this time.  I have tried paging Dr. Teryl Lucy as well for orders but no return call at this time.  Will proceed with transfer as ordered.  Follow-Up - Dr Teryl Lucy returned page and ordering replacement potassium at this time.  As patient is written for Med-Surg without tele with have tech transfer patient at this time.

## 2017-11-02 ENCOUNTER — Telehealth: Payer: Self-pay | Admitting: Cardiovascular Disease

## 2017-11-02 ENCOUNTER — Ambulatory Visit: Payer: Medicare HMO | Admitting: Podiatry

## 2017-11-02 DIAGNOSIS — L089 Local infection of the skin and subcutaneous tissue, unspecified: Secondary | ICD-10-CM

## 2017-11-02 DIAGNOSIS — L97411 Non-pressure chronic ulcer of right heel and midfoot limited to breakdown of skin: Secondary | ICD-10-CM

## 2017-11-02 DIAGNOSIS — E11628 Type 2 diabetes mellitus with other skin complications: Secondary | ICD-10-CM

## 2017-11-02 DIAGNOSIS — I96 Gangrene, not elsewhere classified: Secondary | ICD-10-CM

## 2017-11-02 LAB — BASIC METABOLIC PANEL
Anion gap: 10 (ref 5–15)
BUN: 7 mg/dL (ref 6–20)
CO2: 19 mmol/L — ABNORMAL LOW (ref 22–32)
Calcium: 8 mg/dL — ABNORMAL LOW (ref 8.9–10.3)
Chloride: 112 mmol/L — ABNORMAL HIGH (ref 101–111)
Creatinine, Ser: 1.28 mg/dL — ABNORMAL HIGH (ref 0.61–1.24)
GFR calc Af Amer: >60 mL/min (ref >60)
GFR calc non Af Amer: >60 mL/min (ref >60)
Glucose, Bld: 95 mg/dL (ref 65–99)
Potassium: 3.4 mmol/L — ABNORMAL LOW (ref 3.5–5.1)
Sodium: 141 mmol/L (ref 135–145)

## 2017-11-02 LAB — CBC
HCT: 28.9 % — ABNORMAL LOW (ref 39.0–52.0)
Hemoglobin: 9.7 g/dL — ABNORMAL LOW (ref 13.0–17.0)
MCH: 30.1 pg (ref 26.0–34.0)
MCHC: 33.6 g/dL (ref 30.0–36.0)
MCV: 89.8 fL (ref 78.0–100.0)
Platelets: 164 10*3/uL (ref 150–400)
RBC: 3.22 MIL/uL — ABNORMAL LOW (ref 4.22–5.81)
RDW: 13.9 % (ref 11.5–15.5)
WBC: 7 10*3/uL (ref 4.0–10.5)

## 2017-11-02 LAB — GLUCOSE, CAPILLARY
Glucose-Capillary: 103 mg/dL — ABNORMAL HIGH (ref 65–99)
Glucose-Capillary: 96 mg/dL (ref 65–99)
Glucose-Capillary: 90 mg/dL (ref 65–99)
Glucose-Capillary: 117 mg/dL — ABNORMAL HIGH (ref 65–99)

## 2017-11-02 NOTE — Telephone Encounter (Signed)
Received incoming records from [facility] for upcoming appointment on 11/10/17 @ 11am with Dr. Gwenlyn Found. Records given to [facility] in Medical Records. 11/02/17 ab

## 2017-11-02 NOTE — Progress Notes (Addendum)
PROGRESS NOTE    Timothy Clark  BTD:[id number] DOB: 1971-07-12 DOA: 10/30/2017 PCP: Rosalee Kaufman, PA-C   Brief Narrative: Timothy Clark is a 47 y.o. male with medical history significant of DM, HTN, GERD, Gastroparesis, and blindness. He presented secondary to dyspnea, abdominal pain. He was found to have a fever with evidence of sepsis secondary to osteomyelitis. He was started on broad spectrum antibiotics. Vascular surgery consulted.   Assessment & Plan:   Principal Problem:   DIABETIC FOOT ULCER with Osteomyelitis Active Problems:   Diabetes mellitus type 2 with complications, uncontrolled ([place])   Essential hypertension   Diabetic foot infection ([place])   Sepsis ([place])   CKD stage 3 due to type 2 diabetes mellitus (HCC)   Gangrene of right foot (HCC)   ACE inhibitor-aggravated angioedema, initial encounter   Right shoulder pain   Restlessness and agitation   Obesity, Class III, BMI 40-49.9 (morbid obesity) ([place])   Sepsis Secondary to osteomyelitis of right foot per x-ray. Patient is s/p right 1st toe amputation by podiatry. Sepsis physiology resolved with IV fluids and antibiotics. -Continue antibiotics -Vascular surgery recommendations -Wound care -Blood cultures pending (no growth 3/23)  Osteomyelitis Gangrene Per patient, he states he developed a wound after some ACE bandage wrap cut into his skin and has since had an issue with this non-healing wound. He is s/p amputation of right great toe for diabetic ulcer/osteomyelitis in January. Vancomycin trough elevated. -Discussed with orthopedic surgery who will see. No need for MRI at this time (canceled) -Continue antibiotics (listed below)  RUQ pain Chronic. Pain is actually more epigastric. Patient has had follow-up with GI in [facility]. He states he has had an EGD without anything found. Attributes pain to his gastroparesis. -Continue analgesics/reglan -Continue Protonix  Right shoulder  pain X-ray negative.  Agitation Resolved.  Hypertension Poorly controlled blood pressure but better. -Continue metoprolol -Continue hydralazine 50 mg TID -Continue hydralazine 10 mg IV prn SBP >180 mmHg  Mouth swelling Concern for angioedema. Persistent over the last two weeks. Improved today. -monitor for worsening or compromised airway -Discontinue lisinopril on discharge  CKD stage III At baseline  Diabetes mellitus, type 2 -Continue Lantus and SSI  Hypokalemia Supplementation. -BMP today  Morbid obesity Body mass index is 43.95 kg/m.   DVT prophylaxis: Heparin Code Status:   Code Status: Full Code Family Communication: None at bedside Disposition Plan: Discharge pending management of osteomyelitis/gangrene   Consultants:   Vascular surgery  Procedures:   None  Antimicrobials:  Vancomycin (3/22>>  Aztreonam (3/22>>  Flagyl (3/22>>   Subjective: Some epigastric pain today.  Objective: Vitals:   11/01/17 1320 11/01/17 1945 11/01/17 2058 11/02/17 0414  BP: (!) 184/94 (!) 169/85 (!) 169/85 (!) 175/94  Pulse: 82 87  79  Resp: 17 18  18   Temp: 98.5 F (36.9 C) 98.7 F (37.1 C)  98.3 F (36.8 C)  TempSrc: Oral Oral  Oral  SpO2: 100% 100%  100%  Weight: (!) 147 kg (324 lb 1.2 oz)     Height: 6' (1.829 m)       Intake/Output Summary (Last 24 hours) at 11/02/2017 0847 Last data filed at 11/02/2017 0539 Gross per 24 hour  Intake 2635 ml  Output 3000 ml  Net -365 ml   Filed Weights   10/30/17 2008 11/01/17 1320  Weight: (!) 143.8 kg (317 lb) (!) 147 kg (324 lb 1.2 oz)    Examination:  General exam: Appears calm and comfortable Respiratory: Clear to auscultation  bilaterally. Unlabored work of breathing. No wheezing or rales. Cardiovascular system: Regular rate and rhythm. Normal S1 and S2. No heart murmurs present. No extra heart sounds. No DP pulses Gastrointestinal system: Soft, mild epigastric tenderness, non-distended, no guarding, no  rebound, no masses felt Central nervous system: alert, oriented, moves all extremities Extremities: Bilateral pitting edema with no calf tenderness Skin: right foot with great toe amputation, gangrene of right medial midfoot on dorsal surface. No surrounding erythema. Psychiatry: Normal judgement and mood. Flat affect.     Data Reviewed: I have personally reviewed following labs and imaging studies  CBC: Recent Labs  Lab 10/30/17 2020 10/31/17 0311 11/01/17 0731  WBC 9.3 8.1 5.9  NEUTROABS 7.6  --   --   HGB 9.4* 8.5* 9.4*  HCT 27.3* 24.8* 27.9*  MCV 88.6 89.5 91.8  PLT 169 156 902*   Basic Metabolic Panel: Recent Labs  Lab 10/30/17 2020 10/31/17 0630 11/01/17 0731 11/01/17 1257  NA 142 138 141  --   K 2.7* 3.0* 2.8* 3.2*  CL 105 103 113*  --   CO2 25 25 22   --   GLUCOSE 159* 87 124*  --   BUN 8 6 8   --   CREATININE 1.51* 1.37* 1.36*  --   CALCIUM 8.5* 7.5* 7.6*  --   MG  --   --   --  1.7   GFR: Estimated Creatinine Clearance: 100.1 mL/min (A) (by C-G formula based on SCr of 1.36 mg/dL (H)). Liver Function Tests: Recent Labs  Lab 10/30/17 2020 10/31/17 0630  AST 25 20  ALT 10* 7*  ALKPHOS 59 54  BILITOT 0.8 0.8  PROT 6.0* 5.1*  ALBUMIN 2.7* 2.2*   No results for input(s): LIPASE, AMYLASE in the last 168 hours. No results for input(s): AMMONIA in the last 168 hours. Coagulation Profile: Recent Labs  Lab 10/30/17 2020  INR 1.25   Cardiac Enzymes: No results for input(s): CKTOTAL, CKMB, CKMBINDEX, TROPONINI in the last 168 hours. BNP (last 3 results) No results for input(s): PROBNP in the last 8760 hours. HbA1C: Recent Labs    10/31/17 0311  HGBA1C 6.7*   CBG: Recent Labs  Lab 11/01/17 0742 11/01/17 1201 11/01/17 1717 11/01/17 2111 11/02/17 0759  GLUCAP 115* 88 86 103* 103*   Lipid Profile: No results for input(s): CHOL, HDL, LDLCALC, TRIG, CHOLHDL, LDLDIRECT in the last 72 hours. Thyroid Function Tests: No results for input(s): TSH,  T4TOTAL, FREET4, T3FREE, THYROIDAB in the last 72 hours. Anemia Panel: No results for input(s): VITAMINB12, FOLATE, FERRITIN, TIBC, IRON, RETICCTPCT in the last 72 hours. Sepsis Labs: Recent Labs  Lab 10/30/17 2031 10/31/17 0311 10/31/17 0630  LATICACIDVEN 4.12* 1.3 1.0    Recent Results (from the past 240 hour(s))  Culture, blood (Routine x 2)     Status: None (Preliminary result)   Collection Time: 10/30/17  8:20 PM  Result Value Ref Range Status   Specimen Description   Final    BLOOD RIGHT ARM Performed at [facility] [address], [place], [place] [address]    Special Requests   Final    BOTTLES DRAWN AEROBIC AND ANAEROBIC Blood Culture results may not be optimal due to an excessive volume of blood received in culture bottles Performed at [facility] [address], [place], [place] [address]    Culture   Final    NO GROWTH 2 DAYS Performed at [facility] [address], [place], [place]  [address]    Report Status PENDING  Incomplete  Culture, blood (Routine x 2)     Status: None (Preliminary result)   Collection Time: 10/30/17  9:21 PM  Result Value Ref Range Status   Specimen Description   Final    BLOOD LEFT FOREARM Performed at [facility] [address], [place], [place] [address]    Special Requests   Final    BOTTLES DRAWN AEROBIC AND ANAEROBIC Blood Culture adequate volume   Culture   Final    NO GROWTH 2 DAYS Performed at [facility], [place] [address], [place], [place] [address]    Report Status PENDING  Incomplete  Urine culture     Status: Abnormal   Collection Time: 10/30/17 11:32 PM  Result Value Ref Range Status   Specimen Description   Final    URINE, CLEAN CATCH Performed at [facility], [place] [address] [place], [place] [address]    Special Requests   Final    Normal Performed at [facility], [place] [address], [place], [place]  [address]    Culture (A)  Final    50,000 COLONIES/mL DIPHTHEROIDS(CORYNEBACTERIUM SPECIES) Standardized susceptibility testing for this organism is not available. Performed at [facility], [place] [address] [place], [place] [address]    Report Status 11/01/2017 FINAL  Final  MRSA PCR Screening     Status: None   Collection Time: 10/31/17  2:54 AM  Result Value Ref Range Status   MRSA by PCR NEGATIVE NEGATIVE Final    Comment:        The GeneXpert MRSA Assay (FDA approved for NASAL specimens only), is one component of a comprehensive MRSA colonization surveillance program. It is not intended to diagnose MRSA infection nor to guide or monitor treatment for MRSA infections. Performed at [facility], [place] [address] [place], [place] [address]          Radiology Studies: No results found.      Scheduled Meds: . atorvastatin  10 mg Oral QPM  . brimonidine  1 drop Both Eyes TID  . cloNIDine  0.1 mg Oral BID  . collagenase   Topical Daily  . dicyclomine  10 mg Oral TID AC & HS  . feeding supplement (PRO-STAT SUGAR FREE 64)  30 mL Oral BID  . folic acid  1 mg Oral Daily  . heparin  5,000 Units Subcutaneous Q8H  . hydrALAZINE  50 mg Oral Q8H  . insulin aspart  0-15 Units Subcutaneous TID WC  . insulin glargine  15 Units Subcutaneous BID  . metoCLOPramide  10 mg Oral TID AC  . metoprolol succinate  25 mg Oral Daily  . pantoprazole  40 mg Oral BID AC  . sucralfate  1 g Oral QID  . thiamine  100 mg Oral Daily  . timolol  1 drop Both Eyes BID   Continuous Infusions: . aztreonam Stopped (11/02/17 9675)  . metronidazole Stopped (11/02/17 9163)  . sodium chloride Stopped (10/30/17 2154)  . vancomycin Stopped (11/02/17 0539)     LOS: 3 days     Cordelia Poche, MD [facility] 11/02/2017, 8:47 AM Pager: [phone]  If 7PM-7AM, please contact night-coverage [email or web address] Password [facility] 11/02/2017, 8:47 AM

## 2017-11-02 NOTE — Consult Note (Signed)
ORTHOPAEDIC CONSULTATION  REQUESTING PHYSICIAN: Mariel Aloe, MD  Chief Complaint: Ischemic ulcers dorsal lateral of the right foot with ulcer of the lateral left heel.  HPI: Timothy Clark is a 47 y.o. male who presents with diabetic insensate neuropathy peripheral vascular disease venous insufficiency.  Patient is status post partial first ray amputation on the right with ischemic ulcers dorsally and laterally of the right foot and a ischemic ulcer laterally on the left heel.  Past Medical History:  Diagnosis Date  . Chronic abdominal pain   . Esophagitis   . Eye hemorrhage   . Gastritis   . Gastroparesis   . GERD (gastroesophageal reflux disease)   . Glaucoma   . Helicobacter pylori (H. pylori) infection 2016   ERADICATION DOCUMENTED VIA STOOL TEST in AUG 2016 at [facility]  . Hiccups   . Hypertension   . Nausea and vomiting    chronic, recurrent  . Type 2 diabetes mellitus with complications (HCC)    diagnosed around age 16   Past Surgical History:  Procedure Laterality Date  . CATARACT EXTRACTION W/PHACO Left 05/19/2016   Procedure: CATARACT EXTRACTION PHACO AND INTRAOCULAR LENS PLACEMENT LEFT EYE;  Surgeon: Tonny Branch, MD;  Location: [facility];  Service: Ophthalmology;  Laterality: Left;  CDE: 7.30  . CATARACT EXTRACTION W/PHACO Right 06/23/2016   Procedure: CATARACT EXTRACTION PHACO AND INTRAOCULAR LENS PLACEMENT RIGHT EYE CDE=9.87;  Surgeon: Tonny Branch, MD;  Location: [facility];  Service: Ophthalmology;  Laterality: Right;  right  . ESOPHAGOGASTRODUODENOSCOPY  2015   Dr. Britta Mccreedy  . ESOPHAGOGASTRODUODENOSCOPY N/A 10/26/2014   RMR: Distal esophagititis-likely reflux related although an element of pill induced injuruy no exclueded  status post biopsy. Diffusely abnormal gastric mucosa of uncertain signigicane -status post gastric biopsy. Focal area of excoriation in the cardia most consistant with a trauma of heaving.   . ESOPHAGOGASTRODUODENOSCOPY  08/2015   [facility]: mild  esophagitis and old gastric contents  . EYE SURGERY  2015   stent placed to left eye  . EYE SURGERY    . INCISION AND DRAINAGE OF WOUND Right 07/16/2017   Procedure: IRRIGATION AND DEBRIDEMENT OF SOFT TISSUE OF ULCERATION RIGHT FOOT;  Surgeon: Caprice Beaver, DPM;  Location: [facility];  Service: Podiatry;  Laterality: Right;   Social History   Socioeconomic History  . Marital status: Single    Spouse name: Not on file  . Number of children: Not on file  . Years of education: Not on file  . Highest education level: Not on file  Occupational History  . Not on file  Social Needs  . Financial resource strain: Not on file  . Food insecurity:    Worry: Not on file    Inability: Not on file  . Transportation needs:    Medical: Not on file    Non-medical: Not on file  Tobacco Use  . Smoking status: Never Smoker  . Smokeless tobacco: Never Used  Substance and Sexual Activity  . Alcohol use: No    Alcohol/week: 0.0 oz  . Drug use: No  . Sexual activity: Yes  Lifestyle  . Physical activity:    Days per week: Not on file    Minutes per session: Not on file  . Stress: Not on file  Relationships  . Social connections:    Talks on phone: Not on file    Gets together: Not on file    Attends religious service: Not on file    Active member of club  or organization: Not on file    Attends meetings of clubs or organizations: Not on file    Relationship status: Not on file  Other Topics Concern  . Not on file  Social History Narrative  . Not on file   Family History  Problem Relation Age of Onset  . Ovarian cancer Mother   . Heart attack Father   . Cervical cancer Sister   . Diabetes Sister   . Colon cancer Neg Hx    - negative except otherwise stated in the family history section Allergies  Allergen Reactions  . Donnatal [Pb-Hyoscy-Atropine-Scopol Er] Anaphylaxis    Reaction to GI cocktail  . Fish Allergy Anaphylaxis, Hives and Rash  . Haldol [Haloperidol Lactate] Other  (See Comments)    Chest Pain  . Lidocaine Anaphylaxis    Reaction to GI cocktail  . Maalox [Calcium Carbonate Antacid] Anaphylaxis    Reaction to GI cocktail  . Lisinopril Swelling  . Aspirin Hives and Itching  . Bactrim [Sulfamethoxazole-Trimethoprim] Itching  . Keflex [Cephalexin] Hives, Itching and Nausea And Vomiting  . Penicillins Hives and Itching    Has patient had a PCN reaction causing immediate rash, facial/tongue/throat swelling, SOB or lightheadedness with hypotension: yes Has patient had a PCN reaction causing severe rash involving mucus membranes or skin necrosis: yes Has patient had a PCN reaction that required hospitalization no Has patient had a PCN reaction occurring within the last 10 years: yes If all of the above answers are "NO", then may proceed with Cephalospor  . Phenergan [Promethazine Hcl] Nausea And Vomiting   Prior to Admission medications   Medication Sig Start Date End Date Taking? Authorizing Provider  atorvastatin (LIPITOR) 10 MG tablet Take 10 mg by mouth every evening.    Yes [provider]  cetirizine (ZYRTEC) 10 MG tablet Take 10 mg by mouth daily.   Yes [provider]  cloNIDine (CATAPRES) 0.1 MG tablet Take 1 tablet (0.1 mg total) by mouth 2 (two) times daily. Patient taking differently: Take 0.1 mg by mouth daily.  09/24/17  Yes Georgette Shell, MD  dicyclomine (BENTYL) 10 MG capsule TAKE 1 CAPSULE BY MOUTH 4 TIMES DAILY BEFORE MEAL(S) AND AT BEDTIME 08/06/17  Yes Carlis Stable, NP  hydrALAZINE (APRESOLINE) 25 MG tablet Take 1 tablet (25 mg total) by mouth every 8 (eight) hours. 09/25/17  Yes Georgette Shell, MD  insulin aspart protamine- aspart (NOVOLOG MIX 70/30) (70-30) 100 UNIT/ML injection Inject 0.25 mLs (25 Units total) into the skin 2 (two) times daily with a meal. Patient taking differently: Inject 35 Units into the skin 2 (two) times daily with a meal.  09/24/17  Yes Georgette Shell, MD  lisinopril  (PRINIVIL,ZESTRIL) 5 MG tablet Take 1 tablet (5 mg total) by mouth daily. 09/24/17  Yes Georgette Shell, MD  metoprolol succinate (TOPROL-XL) 25 MG 24 hr tablet Take 25 mg by mouth daily.   Yes [provider]  omeprazole (PRILOSEC) 40 MG capsule Take 40 mg by mouth 2 (two) times daily.   Yes [provider]  pantoprazole (PROTONIX) 40 MG tablet TAKE 1 TABLET BY MOUTH TWICE DAILY BEFORE  A  MEAL 10/21/17  Yes Mahala Menghini, PA-C  sitaGLIPtin (JANUVIA) 100 MG tablet Take 100 mg by mouth daily.   Yes [provider]  sucralfate (CARAFATE) 1 g tablet TAKE ONE TABLET BY MOUTH 4 TIMES DAILY 03/12/16  Yes Annitta Needs, NP  timolol (TIMOPTIC) 0.5 % ophthalmic solution Place  1 drop into both eyes 2 (two) times daily.   Yes [provider]  acetaminophen (TYLENOL) 325 MG tablet Take 2 tablets (650 mg total) by mouth every 6 (six) hours as needed for mild pain (or Fever >/= 101). 09/24/17   Georgette Shell, MD  folic acid (FOLVITE) 1 MG tablet Take 1 tablet (1 mg total) by mouth daily. 09/24/17   Georgette Shell, MD  thiamine 100 MG tablet Take 1 tablet (100 mg total) by mouth daily. 09/24/17   Georgette Shell, MD   No results found. - pertinent xrays, CT, MRI studies were reviewed and independently interpreted  Positive ROS: All other systems have been reviewed and were otherwise negative with the exception of those mentioned in the HPI and as above.  Physical Exam: General: Alert, no acute distress Psychiatric: Patient is competent for consent with normal mood and affect Lymphatic: No axillary or cervical lymphadenopathy Cardiovascular: No pedal edema Respiratory: No cyanosis, no use of accessory musculature GI: No organomegaly, abdomen is soft and non-tender  Skin: Examination patient is a small 5 mm ischemic ulcer over the lateral aspect of the left heel.  Patient has massive venous stasis swelling of both legs with blisters over both legs skin  color changes pitting edema up to the tibial tubercle on the right foot patient has a large ischemic ulcer dorsally and laterally these are each approximately 3 x 5 cm.  These appear to be partial thickness black eschar.  Images:  @ENCIMAGES @   Neurologic: Patient does not have protective sensation bilateral lower extremities.   MUSCULOSKELETAL:  On examination patient does not have a palpable pulse.  By ankle brachial indices his ABIs are greater than 1 however he has calcification of the arteries in both lower extremities with calcification out to the metatarsal heads.  These are falsely elevated values.  I cannot palpate a pulse bilaterally.  There is no cellulitis no purulent drainage.  Assessment: Assessment: Diabetic insensate neuropathy with arterial and venous insufficiency both lower extremities status post partial first ray amputation of the right with ischemic ulcers dorsally and laterally in the right foot and ischemic ulcer laterally on the left heel.  Plan: Plan: We will start Profore wraps to both lower extremities patient will need to continue his foam boots to unload his feet.  Discussed that if we can decrease the swelling significantly there may be enough circulation to heal these ischemic ulcers on both lower extremities.  If compression does not resolve the symptoms we would need to consider surgical intervention.  Orders are written for wound ostomy continence nursing to apply Profore wraps to both lower extremities.  I will follow-up in the office as an outpatient.  Wraps to be changed weekly.  Thank you for the consult and the opportunity to see Timothy Clark, [place] [phone] 6:02 PM

## 2017-11-02 NOTE — Consult Note (Signed)
[facility] Nurse wound consult note Reason for Consult: Right foot neuropathic ulcerations, full thickness with eschar, one on anterior to medial foot, on on lateral foot.  Seen by [facility] (Dr. Oneida Alar) this weekend, who indicates wounds are neuropathic in nature and that local wound care is advised.  If no progress, patient will require below the knee amputation, but that the patient is not at that point currently. Pressure injury on left lateral heel with wound bed obscured by the presence of yellow necrotic slough. Wound type:Neuropathic, pressure Pressure Injury POA: Yes (to left lateral heel) Measurement:  Right anterior to medial foot:  3.5cm x 7.2cm full thickness wound with depth unable to be assessed due to the presence of firmly adherent black eschar. Right lateral foot: 9cm x 2.4cm full thickness wound with depth unable to be determined due to the presence of firmly adherent eschar. Left lateral heel: 1.2cm round full thickness wound with depth unable to be determined due to the presence of yellow, stringy necrotic slough. Wound bed:As described above Drainage (amount, consistency, odor) None Periwound: dry, edematous Dressing procedure/placement/frequency: Dr. Lonny Prude had ordered collagenase [facility]) once daily. I have modified that order to include cleansing and also application to the left lateral heel pressure injury. Prevalon pressure redistribution heel boots are added bilaterally for heel elevation and alignment. Patient to follow up with his PCP and see [facility] as needed.  [facility] nursing team will not follow, but will remain available to this patient, the nursing and medical teams.  Please re-consult if needed. Thanks, Maudie Flakes, MSN, RN, [place], [facility]  Pager# [phone]

## 2017-11-03 DIAGNOSIS — Z91013 Allergy to seafood: Secondary | ICD-10-CM

## 2017-11-03 DIAGNOSIS — Z884 Allergy status to anesthetic agent status: Secondary | ICD-10-CM

## 2017-11-03 DIAGNOSIS — L97419 Non-pressure chronic ulcer of right heel and midfoot with unspecified severity: Secondary | ICD-10-CM

## 2017-11-03 DIAGNOSIS — Z886 Allergy status to analgesic agent status: Secondary | ICD-10-CM

## 2017-11-03 DIAGNOSIS — E11621 Type 2 diabetes mellitus with foot ulcer: Secondary | ICD-10-CM

## 2017-11-03 DIAGNOSIS — L97429 Non-pressure chronic ulcer of left heel and midfoot with unspecified severity: Secondary | ICD-10-CM

## 2017-11-03 DIAGNOSIS — Z882 Allergy status to sulfonamides status: Secondary | ICD-10-CM

## 2017-11-03 DIAGNOSIS — M86671 Other chronic osteomyelitis, right ankle and foot: Secondary | ICD-10-CM

## 2017-11-03 DIAGNOSIS — I10 Essential (primary) hypertension: Secondary | ICD-10-CM

## 2017-11-03 DIAGNOSIS — E1169 Type 2 diabetes mellitus with other specified complication: Secondary | ICD-10-CM

## 2017-11-03 DIAGNOSIS — K3184 Gastroparesis: Secondary | ICD-10-CM

## 2017-11-03 DIAGNOSIS — Z881 Allergy status to other antibiotic agents status: Secondary | ICD-10-CM

## 2017-11-03 DIAGNOSIS — E1143 Type 2 diabetes mellitus with diabetic autonomic (poly)neuropathy: Secondary | ICD-10-CM

## 2017-11-03 DIAGNOSIS — Z888 Allergy status to other drugs, medicaments and biological substances status: Secondary | ICD-10-CM

## 2017-11-03 DIAGNOSIS — Z89421 Acquired absence of other right toe(s): Secondary | ICD-10-CM

## 2017-11-03 DIAGNOSIS — Z880 Allergy status to penicillin: Secondary | ICD-10-CM

## 2017-11-03 LAB — GLUCOSE, CAPILLARY
Glucose-Capillary: 91 mg/dL (ref 65–99)
Glucose-Capillary: 109 mg/dL — ABNORMAL HIGH (ref 65–99)
Glucose-Capillary: 126 mg/dL — ABNORMAL HIGH (ref 65–99)
Glucose-Capillary: 138 mg/dL — ABNORMAL HIGH (ref 65–99)

## 2017-11-03 NOTE — Consult Note (Addendum)
[facility] Nurse wound consult note Reason for Consult: Bilateral Profore compression wrap application 4 layer Profore compression wraps placed bilaterally.  Non-adherent gauze in the kits were placed to the right lateral foot and heel wounds, as well as the left lateral heel and shin wounds.  All wounds were dry and with darkened eschar.  No odor or drainage present; no erythema or induration, no obvious signs of infection.  Patient tolerated procedure very well. Thank you for the consult.  Discussed plan of care with the patient and bedside nurse.  [place] nurse will not follow at this time.  Please re-consult the [facility] team if needed.  Val Riles, RN, MSN, CWOCN, CNS-BC, pager [phone]

## 2017-11-03 NOTE — Progress Notes (Signed)
PROGRESS NOTE    [name]  VOZ:[id number] DOB: 1971/04/12 DOA: 10/30/2017 PCP: Rosalee Kaufman, PA-C   Brief Narrative: Timothy Clark is a 47 y.o. male with medical history significant of DM, HTN, GERD, Gastroparesis, and blindness. He presented secondary to dyspnea, abdominal pain. He was found to have a fever with evidence of sepsis secondary to osteomyelitis. He was started on broad spectrum antibiotics. Vascular surgery consulted.   Assessment & Plan:   Principal Problem:   DIABETIC FOOT ULCER with Osteomyelitis Active Problems:   Diabetes mellitus type 2 with complications, uncontrolled ([place])   Essential hypertension   Diabetic foot infection ([place])   Sepsis ([place])   CKD stage 3 due to type 2 diabetes mellitus (HCC)   Gangrene of right foot (HCC)   ACE inhibitor-aggravated angioedema, initial encounter   Right shoulder pain   Restlessness and agitation   Obesity, Class III, BMI 40-49.9 (morbid obesity) ([place])   Sepsis Secondary to osteomyelitis of right foot per x-ray. Patient is s/p right 1st toe amputation by podiatry. Sepsis physiology resolved with IV fluids and antibiotics. -Continue antibiotics -Vascular surgery recommendations -Wound care -Blood cultures pending (no growth 4/25)  Acute metabolic encephalopathy Secondary to sepsis. Resolved.  Osteomyelitis Gangrene Per patient, he states he developed a wound after some ACE bandage wrap cut into his skin and has since had an issue with this non-healing wound. He is s/p amputation of right great toe for diabetic ulcer/osteomyelitis in January. Vancomycin trough elevated. Ortho not planning surgery; outpatient follow-up. Patient at risk for BKA. -Consult infectious disease -Continue antibiotics (listed below)  RUQ/epigastric pain Chronic. Pain is actually more epigastric. Patient has had follow-up with GI in [facility]. He states he has had an EGD without anything found. Attributes pain to his  gastroparesis and is chronic. -Continue analgesics/reglan -Continue Protonix  Right shoulder pain X-ray negative.  Agitation Resolved.  Hypertension Poorly controlled blood pressure but better. -Continue metoprolol -Continue hydralazine 50 mg TID -Continue clonidine 0.1 mg BID; may increase this if continues to have poorly controlled BP -Continue hydralazine 10 mg IV prn SBP >180 mmHg  Mouth swelling Concern for angioedema. Persistent over the last two weeks. Improved. -monitor for worsening or compromised airway -Discontinue lisinopril on discharge  CKD stage III At baseline  Diabetes mellitus, type 2 -Continue Lantus and SSI  Hypokalemia Supplementation.  Morbid obesity Body mass index is 43.95 kg/m.   DVT prophylaxis: Heparin Code Status:   Code Status: Full Code Family Communication: None at bedside Disposition Plan: Discharge pending management of osteomyelitis/gangrene   Consultants:   Vascular surgery  Orthopedic surgery  Infectious disease  Procedures:   None  Antimicrobials:  Vancomycin IV (3/22>>  Aztreonam (3/22>>  Flagyl (3/22>>   Subjective: Epigastric pain this morning. Patient states this is usual for him.  Objective: Vitals:   11/02/17 0414 11/02/17 1347 11/02/17 2032 11/03/17 0448  BP: (!) 175/94 (!) 173/94 (!) 177/93 (!) 178/99  Pulse: 79 75 83 72  Resp: 18 18 18 16   Temp: 98.3 F (36.8 C) 98.4 F (36.9 C) 99.2 F (37.3 C) 98.8 F (37.1 C)  TempSrc: Oral Oral  Oral  SpO2: 100% 100% 95% 100%  Weight:      Height:        Intake/Output Summary (Last 24 hours) at 11/03/2017 0849 Last data filed at 11/03/2017 9563 Gross per 24 hour  Intake 1790 ml  Output 2153 ml  Net -363 ml   Filed Weights   10/30/17 2008  11/01/17 1320  Weight: (!) 143.8 kg (317 lb) (!) 147 kg (324 lb 1.2 oz)    Examination:  General exam: Appears calm and comfortable Respiratory: Clear to auscultation bilaterally. Unlabored work of  breathing. No wheezing or rales. Cardiovascular system: Regular rate and rhythm. Normal S1 and S2. No heart murmurs present. No extra heart sounds Gastrointestinal system: Abdomen is nondistended, soft and nontender. Normal bowel sounds heard. Central nervous system: Alert and oriented. No focal neurological deficits. Extremities: Significant bilateral edema. Right foot with dry dressings over wound Skin: No cyanosis. No rashes Psychiatry: Judgement and insight appear normal. Normal mood with flat affect.     Data Reviewed: I have personally reviewed following labs and imaging studies  CBC: Recent Labs  Lab 10/30/17 2020 10/31/17 0311 11/01/17 0731 11/02/17 0923  WBC 9.3 8.1 5.9 7.0  NEUTROABS 7.6  --   --   --   HGB 9.4* 8.5* 9.4* 9.7*  HCT 27.3* 24.8* 27.9* 28.9*  MCV 88.6 89.5 91.8 89.8  PLT 169 156 142* 741   Basic Metabolic Panel: Recent Labs  Lab 10/30/17 2020 10/31/17 0630 11/01/17 0731 11/01/17 1257 11/02/17 0923  NA 142 138 141  --  141  K 2.7* 3.0* 2.8* 3.2* 3.4*  CL 105 103 113*  --  112*  CO2 25 25 22   --  19*  GLUCOSE 159* 87 124*  --  95  BUN 8 6 8   --  7  CREATININE 1.51* 1.37* 1.36*  --  1.28*  CALCIUM 8.5* 7.5* 7.6*  --  8.0*  MG  --   --   --  1.7  --    GFR: Estimated Creatinine Clearance: 106.4 mL/min (A) (by C-G formula based on SCr of 1.28 mg/dL (H)). Liver Function Tests: Recent Labs  Lab 10/30/17 2020 10/31/17 0630  AST 25 20  ALT 10* 7*  ALKPHOS 59 54  BILITOT 0.8 0.8  PROT 6.0* 5.1*  ALBUMIN 2.7* 2.2*   No results for input(s): LIPASE, AMYLASE in the last 168 hours. No results for input(s): AMMONIA in the last 168 hours. Coagulation Profile: Recent Labs  Lab 10/30/17 2020  INR 1.25   Cardiac Enzymes: No results for input(s): CKTOTAL, CKMB, CKMBINDEX, TROPONINI in the last 168 hours. BNP (last 3 results) No results for input(s): PROBNP in the last 8760 hours. HbA1C: No results for input(s): HGBA1C in the last 72  hours. CBG: Recent Labs  Lab 11/02/17 0759 11/02/17 1153 11/02/17 1720 11/02/17 2137 11/03/17 0806  GLUCAP 103* 96 90 117* 91   Lipid Profile: No results for input(s): CHOL, HDL, LDLCALC, TRIG, CHOLHDL, LDLDIRECT in the last 72 hours. Thyroid Function Tests: No results for input(s): TSH, T4TOTAL, FREET4, T3FREE, THYROIDAB in the last 72 hours. Anemia Panel: No results for input(s): VITAMINB12, FOLATE, FERRITIN, TIBC, IRON, RETICCTPCT in the last 72 hours. Sepsis Labs: Recent Labs  Lab 10/30/17 2031 10/31/17 0311 10/31/17 0630  LATICACIDVEN 4.12* 1.3 1.0    Recent Results (from the past 240 hour(s))  Culture, blood (Routine x 2)     Status: None (Preliminary result)   Collection Time: 10/30/17  8:20 PM  Result Value Ref Range Status   Specimen Description   Final    BLOOD RIGHT ARM Performed at [facility] [address] [place], [place] [address]    Special Requests   Final    BOTTLES DRAWN AEROBIC AND ANAEROBIC Blood Culture results may not be optimal due to an excessive volume of blood  received in culture bottles Performed at [facility], [place] [address], [place], [place] [address]    Culture   Final    NO GROWTH 3 DAYS Performed at [facility], [place] [address], [place], [place] [address]    Report Status PENDING  Incomplete  Culture, blood (Routine x 2)     Status: None (Preliminary result)   Collection Time: 10/30/17  9:21 PM  Result Value Ref Range Status   Specimen Description   Final    BLOOD LEFT FOREARM Performed at [facility] [address], [place], [place] [address]    Special Requests   Final    BOTTLES DRAWN AEROBIC AND ANAEROBIC Blood Culture adequate volume   Culture   Final    NO GROWTH 3 DAYS Performed at [facility], [place] [address] [place], [place] [address]    Report Status PENDING  Incomplete  Urine culture     Status: Abnormal   Collection Time: 10/30/17 11:32  PM  Result Value Ref Range Status   Specimen Description   Final    URINE, CLEAN CATCH Performed at [facility], [place] [address] [place], [place] [address]    Special Requests   Final    Normal Performed at [facility], [place] [address], [place], [place] [address]    Culture (A)  Final    50,000 COLONIES/mL DIPHTHEROIDS(CORYNEBACTERIUM SPECIES) Standardized susceptibility testing for this organism is not available. Performed at [facility], [place] [address], [place], [place] [address]    Report Status 11/01/2017 FINAL  Final  MRSA PCR Screening     Status: None   Collection Time: 10/31/17  2:54 AM  Result Value Ref Range Status   MRSA by PCR NEGATIVE NEGATIVE Final    Comment:        The GeneXpert MRSA Assay (FDA approved for NASAL specimens only), is one component of a comprehensive MRSA colonization surveillance program. It is not intended to diagnose MRSA infection nor to guide or monitor treatment for MRSA infections. Performed at [facility], [place] [address] [place], [place] [address]          Radiology Studies: No results found.      Scheduled Meds: . atorvastatin  10 mg Oral QPM  . brimonidine  1 drop Both Eyes TID  . cloNIDine  0.1 mg Oral BID  . collagenase   Topical Daily  . dicyclomine  10 mg Oral TID AC & HS  . feeding supplement (PRO-STAT SUGAR FREE 64)  30 mL Oral BID  . folic acid  1 mg Oral Daily  . heparin  5,000 Units Subcutaneous Q8H  . hydrALAZINE  50 mg Oral Q8H  . insulin aspart  0-15 Units Subcutaneous TID WC  . insulin glargine  15 Units Subcutaneous BID  . metoCLOPramide  10 mg Oral TID AC  . metoprolol succinate  25 mg Oral Daily  . pantoprazole  40 mg Oral BID AC  . sucralfate  1 g Oral QID  . thiamine  100 mg Oral Daily  . timolol  1 drop Both Eyes BID   Continuous Infusions: . aztreonam Stopped (11/03/17 0519)  . metronidazole Stopped (11/03/17 0549)  . sodium chloride  Stopped (10/30/17 2154)  . vancomycin Stopped (11/03/17 0549)     LOS: 4 days     Cordelia Poche, MD [facility] 11/03/2017, 8:49 AM Pager: [phone]  If 7PM-7AM, please contact  night-coverage [email or web address] Password [facility] 11/03/2017, 8:49 AM

## 2017-11-03 NOTE — Consult Note (Signed)
[facility] for Infectious Disease    Date of Admission:  10/30/2017     Total days of antibiotics 5  Day 5 aztreonam  Day 5 metronidazole  Day 5 vancomycin                Reason for Consult: chronic ulcerations of feet in diabetic patient    Referring Provider: Dr. Teryl Lucy  Primary Care Provider: Rosalee Kaufman, PA-C   Assessment: 47 y.o. diabetic male patient very familiar to me here now with new wounds to medial and lateral aspect of his right midfoot and new wound to left heel. Foot xray with changes suggestive of osteomyelitis of the #1 remaining MTP but nothing remarkable regarding outer aspects of feet where present wounds are now.   Unfortunately his legs are wrapped in treated wrap that is meant to be left on for one week and no pictures in the chart. Based on description however of multiple members of mr. Leija's care team it seams there is not much documentation regarding infectious descriptors. I called and spoke personally with Sherrie from our wound care team whom wrapped his feet today - she reports that there is no odor and all areas have evolved into dry eschar most concerning for ischemic wound; left ulcer is relatively new per patient's report and no signs of diabetic foot infection contributor at least at present on day 5 of broad spectrum antibiotic therapy.   He is not able to use PICC line for home therapies (insurance coverage vs ability to have his father help with infusions) and has in the past been unwilling/able to go to SNF for support if I recall for financial reasons.   I am not certain infection is contributing or if this is just related to ischemic wounds although he did present with fever without alternative explanation for this. His compression/treatment wraps are meant to be left in place for one week - although vascular assessment was deemed acceptable ABIs revealed non-compressible vessels and I have never been able to fully appreciate  a pulse with palpation. Right and left TBIs are normal.   Plan: 1. Would continue current antibiotic therapy for now. May consider use of oral therapy like linezolid +/- flagyl for good coverage of skin contributors for a short term after present therapy until we see how he does with wraps.  2. May need to consider work up of left heel to help with determination of therapy if there is any evidence of osteomyelitis of this site.   Will make further recommendations tomorrow and arrange for outpatient follow up coordinated with ortho team so we can get an idea of what his wounds look like next week.   Principal Problem:   DIABETIC FOOT ULCER with Osteomyelitis Active Problems:   Diabetes mellitus type 2 with complications, uncontrolled ([place])   Essential hypertension   Diabetic foot infection ([place])   Sepsis ([place])   CKD stage 3 due to type 2 diabetes mellitus ([place])   Gangrene of right foot (HCC)   ACE inhibitor-aggravated angioedema, initial encounter   Right shoulder pain   Restlessness and agitation   Obesity, Class III, BMI 40-49.9 (morbid obesity) ([place])   . atorvastatin  10 mg Oral QPM  . brimonidine  1 drop Both Eyes TID  . cloNIDine  0.1 mg Oral BID  . collagenase   Topical Daily  . dicyclomine  10 mg Oral TID AC & HS  . feeding supplement (PRO-STAT  SUGAR FREE 64)  30 mL Oral BID  . folic acid  1 mg Oral Daily  . heparin  5,000 Units Subcutaneous Q8H  . hydrALAZINE  50 mg Oral Q8H  . insulin aspart  0-15 Units Subcutaneous TID WC  . insulin glargine  15 Units Subcutaneous BID  . metoCLOPramide  10 mg Oral TID AC  . metoprolol succinate  25 mg Oral Daily  . pantoprazole  40 mg Oral BID AC  . sucralfate  1 g Oral QID  . thiamine  100 mg Oral Daily  . timolol  1 drop Both Eyes BID    HPI: Timothy Clark is a 47 y.o. male with past medical history outlined below. He was admitted on 10/30/2017 with fevers and altered mental status. He has had known right foot osteomyelitis that  I have in the past attempted to treat on an outpatient basis with antibiotics alone; however he was unable to tolerate oral therapy and unable to accommodate arrangements for PICC line for parenteral therapy and ultimately underwent partial first ray amputation by Dr. Amalia Hailey on 08/20/17 after he required hospitalization and found to have evolving septic arthritis/effusion of #1MTP. Mr. Schnick has had a few hospitalizations since I last saw him prior to his amputation - recently with a stay concerning his chronic GI issues and hypertension. It seems that he has developed ischemic gangrenous ulcerations to the right dorsal and lateral mid foot due to tight foot wraps of which he was unaware of considering he is completely insensate. He was seen by his podiatrist Dr. Amalia Hailey on 10/12/17 and was referred back to vascular surgery for work up with new ischemic wounds. Apparently Mr. Sax insurance has been limiting regarding what they can provide coverage for his wound care and he has been painting wounds with betadine.   ED Course: Presented to [facility] hospital on 10/30/17 with abdominal pain, chest pain, fevers and difficulty breathing. He felt that his abdominal pain was the same quality and characteristic of his associated diabetic gastroparesis. Temp mex 101.5, lactate elevated at 4 and was agitated with AMS in the ER requiring restraint. He was started on aztreonam, flagyl and vancomycin for worsened swelling concerning for cellulitis. Duplex of the RLE shows no significant arterial obstruction and should have adequate healing potential per Dr. Oneida Alar' assessment. Wound care recommending santyl once daily and cleansing of left lateral heel pressure injury. XRay of the right foot with continued evidence of chronic osteomyelitis. Dr. Sharol Given assessed wound and will start Profore wraps to both lower extremities with foam boots for offloading therapy - he is hopeful that wounds will resolve with compression and wound  care.   He has developed a few new wounds since I have seen him one of which is on the contralateral foot. Previously I have attempted treatment with cipro+doxy for him of which he was unable to tolerate. Intolerant of Keflex and doxy seemed to really flare his gastroparesis.    Review of Systems: Review of Systems  Constitutional: Positive for fever. Negative for chills.  HENT: Negative for tinnitus.   Eyes: Negative for blurred vision and photophobia.  Respiratory: Negative for cough and sputum production.   Cardiovascular: Negative for chest pain.  Gastrointestinal: Positive for abdominal pain and vomiting. Negative for diarrhea and nausea.  Genitourinary: Negative for dysuria.  Musculoskeletal:       No feeling in feet   Skin: Negative for rash.  Neurological: Negative for headaches.    Past Medical History:  Diagnosis  Date  . Chronic abdominal pain   . Esophagitis   . Eye hemorrhage   . Gastritis   . Gastroparesis   . GERD (gastroesophageal reflux disease)   . Glaucoma   . Helicobacter pylori (H. pylori) infection 2016   ERADICATION DOCUMENTED VIA STOOL TEST in AUG 2016 at [facility]  . Hiccups   . Hypertension   . Nausea and vomiting    chronic, recurrent  . Type 2 diabetes mellitus with complications (HCC)    diagnosed around age 83    Social History   Tobacco Use  . Smoking status: Never Smoker  . Smokeless tobacco: Never Used  Substance Use Topics  . Alcohol use: No    Alcohol/week: 0.0 oz  . Drug use: No    Family History  Problem Relation Age of Onset  . Ovarian cancer Mother   . Heart attack Father   . Cervical cancer Sister   . Diabetes Sister   . Colon cancer Neg Hx    Allergies  Allergen Reactions  . Donnatal [Pb-Hyoscy-Atropine-Scopolamine] Anaphylaxis    Reaction to GI cocktail  . Fish Allergy Anaphylaxis, Hives and Rash  . Haldol [Haloperidol Lactate] Other (See Comments)    Chest Pain  . Lidocaine Anaphylaxis    Reaction to GI cocktail   . Maalox [Calcium Carbonate Antacid] Anaphylaxis    Reaction to GI cocktail  . Lisinopril Swelling  . Aspirin Hives and Itching  . Bactrim [Sulfamethoxazole-Trimethoprim] Itching  . Keflex [Cephalexin] Hives, Itching and Nausea And Vomiting  . Penicillins Hives and Itching    Has patient had a PCN reaction causing immediate rash, facial/tongue/throat swelling, SOB or lightheadedness with hypotension: yes Has patient had a PCN reaction causing severe rash involving mucus membranes or skin necrosis: yes Has patient had a PCN reaction that required hospitalization no Has patient had a PCN reaction occurring within the last 10 years: yes If all of the above answers are "NO", then may proceed with Cephalospor  . Phenergan [Promethazine Hcl] Nausea And Vomiting    OBJECTIVE: Blood pressure (!) 178/99, pulse 72, temperature 98.8 F (37.1 C), temperature source Oral, resp. rate 16, height 6' (1.829 m), weight (!) 324 lb 1.2 oz (147 kg), SpO2 100 %.  Physical Exam  Constitutional: He is oriented to person, place, and time and well-developed, well-nourished, and in no distress.  HENT:  Mouth/Throat: No oral lesions. Normal dentition. No dental caries.  Eyes: No scleral icterus.  Cardiovascular: Normal rate, regular rhythm and normal heart sounds.  No murmur heard. Pulmonary/Chest: Effort normal and breath sounds normal.  Abdominal: Soft. He exhibits no distension. There is no tenderness.  Musculoskeletal: He exhibits edema (generalized edema, 2+ through mid thigh ).  Lymphadenopathy:    He has no cervical adenopathy.  Neurological: He is alert and oriented to person, place, and time.  Skin: Skin is warm and dry. No rash noted.  Unable to assess wounds to feet/heel d/t application of wraps.   Psychiatric: Mood and affect normal.  Nursing note and vitals reviewed.  Lab Results Lab Results  Component Value Date   WBC 7.0 11/02/2017   HGB 9.7 (L) 11/02/2017   HCT 28.9 (L) 11/02/2017    MCV 89.8 11/02/2017   PLT 164 11/02/2017    Lab Results  Component Value Date   CREATININE 1.28 (H) 11/02/2017   BUN 7 11/02/2017   NA 141 11/02/2017   K 3.4 (L) 11/02/2017   CL 112 (H) 11/02/2017   CO2 19 (L) 11/02/2017  Lab Results  Component Value Date   ALT 7 (L) 10/31/2017   AST 20 10/31/2017   ALKPHOS 54 10/31/2017   BILITOT 0.8 10/31/2017     Microbiology: Recent Results (from the past 240 hour(s))  Culture, blood (Routine x 2)     Status: None (Preliminary result)   Collection Time: 10/30/17  8:20 PM  Result Value Ref Range Status   Specimen Description   Final    BLOOD RIGHT ARM Performed at [facility] [address] [place], [place] [address]    Special Requests   Final    BOTTLES DRAWN AEROBIC AND ANAEROBIC Blood Culture results may not be optimal due to an excessive volume of blood received in culture bottles Performed at [facility] [address] [place], [place] [address]    Culture   Final    NO GROWTH 4 DAYS Performed at [facility], [place] [address], [place], [place] [address]    Report Status PENDING  Incomplete  Culture, blood (Routine x 2)     Status: None (Preliminary result)   Collection Time: 10/30/17  9:21 PM  Result Value Ref Range Status   Specimen Description   Final    BLOOD LEFT FOREARM Performed at [facility] [address], [place], [place] [address]    Special Requests   Final    BOTTLES DRAWN AEROBIC AND ANAEROBIC Blood Culture adequate volume   Culture   Final    NO GROWTH 4 DAYS Performed at [facility], [place] [address], [place], [place] [address]    Report Status PENDING  Incomplete  Urine culture     Status: Abnormal   Collection Time: 10/30/17 11:32 PM  Result Value Ref Range Status   Specimen Description   Final    URINE, CLEAN CATCH Performed at [facility], [place] [address], [place], [place] [address]    Special Requests    Final    Normal Performed at [facility], [place] [address], [place], [place] [address]    Culture (A)  Final    50,000 COLONIES/mL DIPHTHEROIDS(CORYNEBACTERIUM SPECIES) Standardized susceptibility testing for this organism is not available. Performed at [facility], [place] [address], [place], [place] [address]    Report Status 11/01/2017 FINAL  Final  MRSA PCR Screening     Status: None   Collection Time: 10/31/17  2:54 AM  Result Value Ref Range Status   MRSA by PCR NEGATIVE NEGATIVE Final    Comment:        The GeneXpert MRSA Assay (FDA approved for NASAL specimens only), is one component of a comprehensive MRSA colonization surveillance program. It is not intended to diagnose MRSA infection nor to guide or monitor treatment for MRSA infections. Performed at [facility], [place] [address], [place], [place] [address]     [name], MSN, NP-C [facility] [place] Cell: [phone] Pager: [phone]  11/03/2017 12:45 PM

## 2017-11-04 LAB — BASIC METABOLIC PANEL
Anion gap: 7 (ref 5–15)
BUN: 5 mg/dL — ABNORMAL LOW (ref 6–20)
CO2: 23 mmol/L (ref 22–32)
Calcium: 7.7 mg/dL — ABNORMAL LOW (ref 8.9–10.3)
Chloride: 108 mmol/L (ref 101–111)
Creatinine, Ser: 1.26 mg/dL — ABNORMAL HIGH (ref 0.61–1.24)
GFR calc Af Amer: >60 mL/min (ref >60)
GFR calc non Af Amer: >60 mL/min (ref >60)
Glucose, Bld: 87 mg/dL (ref 65–99)
Potassium: 2.6 mmol/L — CL (ref 3.5–5.1)
Sodium: 138 mmol/L (ref 135–145)

## 2017-11-04 LAB — CULTURE, BLOOD (ROUTINE X 2)
Culture: NO GROWTH
Culture: NO GROWTH
Special Requests: ADEQUATE

## 2017-11-04 LAB — GLUCOSE, CAPILLARY
Glucose-Capillary: 73 mg/dL (ref 65–99)
Glucose-Capillary: 78 mg/dL (ref 65–99)
Glucose-Capillary: 113 mg/dL — ABNORMAL HIGH (ref 65–99)
Glucose-Capillary: 114 mg/dL — ABNORMAL HIGH (ref 65–99)

## 2017-11-04 LAB — CBC
HCT: 28.5 % — ABNORMAL LOW (ref 39.0–52.0)
Hemoglobin: 9.4 g/dL — ABNORMAL LOW (ref 13.0–17.0)
MCH: 29.7 pg (ref 26.0–34.0)
MCHC: 33 g/dL (ref 30.0–36.0)
MCV: 89.9 fL (ref 78.0–100.0)
Platelets: 134 10*3/uL — ABNORMAL LOW (ref 150–400)
RBC: 3.17 MIL/uL — ABNORMAL LOW (ref 4.22–5.81)
RDW: 13.6 % (ref 11.5–15.5)
WBC: 6.2 10*3/uL (ref 4.0–10.5)

## 2017-11-04 LAB — URINE CULTURE: Culture: NO GROWTH

## 2017-11-04 LAB — POTASSIUM: Potassium: 3.7 mmol/L (ref 3.5–5.1)

## 2017-11-04 LAB — VANCOMYCIN, TROUGH: Vancomycin Tr: 24 ug/mL (ref 15–20)

## 2017-11-04 MED ORDER — POTASSIUM CHLORIDE CRYS ER 20 MEQ PO TBCR
40.0000 meq | EXTENDED_RELEASE_TABLET | Freq: Two times a day (BID) | ORAL | Status: DC
  Administered 2017-11-04 – 2017-11-06 (×5): 40 meq via ORAL
  Filled 2017-11-04 (×6): qty 2

## 2017-11-04 MED ORDER — VANCOMYCIN HCL IN DEXTROSE 750-5 MG/150ML-% IV SOLN
750.0000 mg | Freq: Two times a day (BID) | INTRAVENOUS | Status: DC
  Filled 2017-11-04: qty 150

## 2017-11-04 MED ORDER — LORAZEPAM 0.5 MG PO TABS
0.5000 mg | ORAL_TABLET | Freq: Once | ORAL | Status: AC
  Administered 2017-11-04: 0.5 mg via ORAL
  Filled 2017-11-04: qty 1

## 2017-11-04 MED ORDER — FUROSEMIDE 10 MG/ML IJ SOLN
40.0000 mg | Freq: Every day | INTRAMUSCULAR | Status: DC
  Administered 2017-11-04 – 2017-11-06 (×3): 40 mg via INTRAVENOUS
  Filled 2017-11-04 (×3): qty 4

## 2017-11-04 MED ORDER — POTASSIUM CHLORIDE CRYS ER 10 MEQ PO TBCR
30.0000 meq | EXTENDED_RELEASE_TABLET | ORAL | Status: AC
  Administered 2017-11-04 (×2): 30 meq via ORAL
  Filled 2017-11-04 (×2): qty 1

## 2017-11-04 MED ORDER — SODIUM CHLORIDE 0.9 % IV SOLN
1.0000 g | INTRAVENOUS | Status: DC
  Administered 2017-11-04 – 2017-11-05 (×2): 1 g via INTRAVENOUS
  Filled 2017-11-04 (×2): qty 1

## 2017-11-04 NOTE — Progress Notes (Signed)
Nutrition Follow-up  DOCUMENTATION CODES:   Morbid obesity  INTERVENTION:   -Continue 30 ml Prostat BID, each supplement provides 100 kcals and 15 grams protein  NUTRITION DIAGNOSIS:   Increased nutrient needs related to wound healing as evidenced by estimated needs.  Ongoing  GOAL:   Patient will meet greater than or equal to 90% of their needs  Progressing  MONITOR:   PO intake, Supplement acceptance, Labs, Weight trends, Skin, I & O's  REASON FOR ASSESSMENT:   Malnutrition Screening Tool, Consult Wound healing  ASSESSMENT:   Timothy Clark is a 47 y.o. male with medical history significant of DM, HTN, GERD, Gastroparesis, and blindness presents to the emergency department with difficulty breathing, abdominal pain, chest pain, fever.  3/24- transferred to floor; CWOCN consulted  Pt receiving nursing care at time of visit.   Pt remains with good appetite; noted 75-100% meal completion. Pt is taking Prostat per MAR.   Per ID notes, plan for potential PICC placement for IV antibiotics.   Reviewed I/O's: -1.1 L x 24 hours and +6.3 L since admission.   Medications reviewed and include folvite  Labs reviewed: K: 2.6, CBGS: 73-138 (inpatient orders for glycemic control are 0-15 units insulin aspart TID with meals, 15 units insulin glargine BID).   Diet Order:  Diet Carb Modified Fluid consistency: Thin; Room service appropriate? Yes  EDUCATION NEEDS:   No education needs have been identified at this time  Skin:  Skin Assessment: Skin Integrity Issues: Skin Integrity Issues:: Diabetic Ulcer Diabetic Ulcer: lt lateral heel, rt lateral heel, lt anterior to medial foot  Last BM:  11/02/17  Height:   Ht Readings from Last 1 Encounters:  11/01/17 6' (1.829 m)    Weight:   Wt Readings from Last 1 Encounters:  11/01/17 (!) 324 lb 1.2 oz (147 kg)    Ideal Body Weight:  80.9 kg  BMI:  Body mass index is 43.95 kg/m.  Estimated Nutritional Needs:    Kcal:  1800-2000  Protein:  120-135 grams  Fluid:  1.8-2.0 L    Yosiah Jasmin A. Jimmye Norman, RD, LDN, CDE Pager: [phone] After hours Pager: [phone]

## 2017-11-04 NOTE — Progress Notes (Signed)
[facility] for [facility] Indication:  sepsis + right foot osteomyelitis/gangrene, L heel wound  Allergies  Allergen Reactions  . Donnatal [Pb-Hyoscy-Atropine-Scopolamine] Anaphylaxis    Reaction to GI cocktail  . Fish Allergy Anaphylaxis, Hives and Rash  . Haldol [Haloperidol Lactate] Other (See Comments)    Chest Pain  . Lidocaine Anaphylaxis    Reaction to GI cocktail  . Maalox [Calcium Carbonate Antacid] Anaphylaxis    Reaction to GI cocktail  . Lisinopril Swelling  . Aspirin Hives and Itching  . Bactrim [Sulfamethoxazole-Trimethoprim] Itching  . Keflex [Cephalexin] Hives, Itching and Nausea And Vomiting  . Penicillins Hives and Itching    Has patient had a PCN reaction causing immediate rash, facial/tongue/throat swelling, SOB or lightheadedness with hypotension: yes Has patient had a PCN reaction causing severe rash involving mucus membranes or skin necrosis: yes Has patient had a PCN reaction that required hospitalization no Has patient had a PCN reaction occurring within the last 10 years: yes If all of the above answers are "NO", then may proceed with Cephalospor  . Phenergan [Promethazine Hcl] Nausea And Vomiting    Patient Measurements: Height: 6' (182.9 cm) Weight: (!) 324 lb 1.2 oz (147 kg) IBW/kg (Calculated) : 77.6 Adjusted Body Weight:    Vital Signs: Temp: 98.8 F (37.1 C) (03/27 0518) Temp Source: Oral (03/27 0518) BP: 137/78 (03/27 0518) Pulse Rate: 76 (03/27 0518) Intake/Output from previous day: 03/26 0701 - 03/27 0700 In: 1824 [P.O.:924; IV Piggyback:900] Out: 2900 [Urine:2900] Intake/Output from this shift: Total I/O In: -  Out: 450 [Urine:450]  Labs: Recent Labs    11/02/17 0923 11/04/17 0439  WBC 7.0 6.2  HGB 9.7* 9.4*  PLT 164 134*  CREATININE 1.28* 1.26*   Estimated Creatinine Clearance: 108 mL/min (A) (by C-G formula based on SCr of 1.26 mg/dL (H)). Recent Labs    11/01/17 2129 11/04/17 1138   VANCOTROUGH 35* 24*     Microbiology:   Medical History: Past Medical History:  Diagnosis Date  . Chronic abdominal pain   . Esophagitis   . Eye hemorrhage   . Gastritis   . Gastroparesis   . GERD (gastroesophageal reflux disease)   . Glaucoma   . Helicobacter pylori (H. pylori) infection 2016   ERADICATION DOCUMENTED VIA STOOL TEST in AUG 2016 at [facility]  . Hiccups   . Hypertension   . Nausea and vomiting    chronic, recurrent  . Type 2 diabetes mellitus with complications ([place])    diagnosed around age 13   Assessment: ID: [id number] for sepsis + right foot osteomyelitis/gangrene, L heel wound -Afebrile, WBC WNL - status post partial first ray amputation on the right with ischemic ulcers dorsally and laterally of the right foot and a ischemic ulcer laterally on the left heel. Scr stable.  Azactam 3/22 >>  Flagyl 3/22 >>  Vanc 3/22 >>  3/22 BCx - Negative 3/22 UCx - 50,000 diphtheroids 3/23 MRSA PCR - negative  3/24: VT 35: Decr to 750mg /8hr 3/27: VT 24: Decrease to 750mg  IV q 12h  Goal of Therapy:  Vancomycin trough level 15-20 mcg/ml  Plan:  Vanc 750mg  IV q 12 hrs, decreased interval Azactam 2gm IV Q8H Flagyl 500mg  IV Q8H  Doctor Sheahan S. Alford Highland, PharmD, BCPS Clinical Staff Pharmacist Pager [phone]  [place], [place] 11/04/2017,1:03 PM

## 2017-11-04 NOTE — Progress Notes (Signed)
[facility] for Infectious Disease  Date of Admission:  10/30/2017     Total days of antibiotics 6  Day 6 aztreonam  Day 6 vancomycin  Day 6 metronidazole     ASSESSMENT: 47 y.o. diabetic male with new diabetic wound to the left heel and mixed ischemic/vascular wounds to the right lateral/medial foot. Recent bone biopsy from January from amputation showed no evidence of osteomyelitis distally and previous bone biopsy in 04/2017 had shown changes consistent with resolving osteomyelitis. Not completely certain this is driving his current problem or if related to new superficial wounds/cellulitis. Either way I have had several discussions with Timothy Clark outpatient regarding my recommendation for IV therapy due to his intolerance of oral therapy with multiple allergies and severe gastroparesis. He is open to PICC placement and IV therapy if his insurance will approve this. He is on day 6 of current therapy and has improved nicely on antibiotics. He appears fluid over loaded today and could stand to have some diuresis prior to discharge.   Will consolidate IV antibiotics to ertapenem alone - this will cover anaerobes in addition to sensitive staph, strep and more common gram negatives; less concern for pseudomonas. I am uncertain as to how long the left heel ulcer has been present and unsure exactly how much it is contributing to overall picture, but would like to cover for potential of polymicrobial involvement.   PLAN: 1. Will consolidate IV antibiotics to ertapenem and observe for hypersensitivity reaction over the next 1-2 days.   2. Would plan for 3 - 4 weeks of IV therapy depending on his progression. Will need to coordinate with orthopedic appointments outpatient to follow wound progression.  3. Can add Vancomycin outpatient vs PO linezolid if unable to dose multiple times a day if no improvement and concern for MRSA contributor (he has consistently screened negative with  hospitalizations and I have elected to not cover due to this to help with simplicity of regimen).  4. Seems he will be staying for some IV diuresis after discussions with Dr. Loleta Books so plan to arrange PICC line tomorrow once we can confirm with [facility] that this is an option.   I have discussed with Carolynn Sayers who will look into services for Timothy Clark - updated that the address in our system is not current address; patient is staying in [place] area with his father.   Principal Problem:   Diabetic foot infection ([place]) Active Problems:   Diabetic infection of left heel ([place])   Sepsis ([place])   Gangrene of right foot ([place])   Diabetes mellitus type 2 with complications, uncontrolled ([place])   Essential hypertension   CKD stage 3 due to type 2 diabetes mellitus ([facility])   ACE inhibitor-aggravated angioedema, initial encounter   Right shoulder pain   Restlessness and agitation   Obesity, Class III, BMI 40-49.9 (morbid obesity) ([place])   . atorvastatin  10 mg Oral QPM  . brimonidine  1 drop Both Eyes TID  . cloNIDine  0.1 mg Oral BID  . collagenase   Topical Daily  . dicyclomine  10 mg Oral TID AC & HS  . feeding supplement (PRO-STAT SUGAR FREE 64)  30 mL Oral BID  . folic acid  1 mg Oral Daily  . heparin  5,000 Units Subcutaneous Q8H  . hydrALAZINE  50 mg Oral Q8H  . insulin aspart  0-15 Units Subcutaneous TID WC  . insulin glargine  15 Units Subcutaneous BID  . metoCLOPramide  10 mg Oral TID AC  . metoprolol succinate  25 mg Oral Daily  . pantoprazole  40 mg Oral BID AC  . potassium chloride  40 mEq Oral BID  . sucralfate  1 g Oral QID  . thiamine  100 mg Oral Daily  . timolol  1 drop Both Eyes BID    SUBJECTIVE: Feeling alright today aside from feeling "puffy and nauseated." Tolerating the wraps to both feet well without pain or worsened swelling. Further discussion with Timothy Clark regarding barriers to IV care - reminded me that previous provider ([facility])  stopped providing services from what he understands to be due to the fact that he lives in [place] and insurance was in [place]. He is open to IV treatment if his insurance will help provide services for medications and nursing. He lives with his 83 yo father whom will not be able to help with infusions but he does have several sisters that live near him that could help.   Allergies  Allergen Reactions  . Donnatal [Pb-Hyoscy-Atropine-Scopolamine] Anaphylaxis    Reaction to GI cocktail  . Fish Allergy Anaphylaxis, Hives and Rash  . Haldol [Haloperidol Lactate] Other (See Comments)    Chest Pain  . Lidocaine Anaphylaxis    Reaction to GI cocktail  . Maalox [Calcium Carbonate Antacid] Anaphylaxis    Reaction to GI cocktail  . Lisinopril Swelling  . Aspirin Hives and Itching  . Bactrim [Sulfamethoxazole-Trimethoprim] Itching  . Keflex [Cephalexin] Hives, Itching and Nausea And Vomiting  . Penicillins Hives and Itching    Has patient had a PCN reaction causing immediate rash, facial/tongue/throat swelling, SOB or lightheadedness with hypotension: yes Has patient had a PCN reaction causing severe rash involving mucus membranes or skin necrosis: yes Has patient had a PCN reaction that required hospitalization no Has patient had a PCN reaction occurring within the last 10 years: yes If all of the above answers are "NO", then may proceed with Cephalospor  . Phenergan [Promethazine Hcl] Nausea And Vomiting    OBJECTIVE: Vitals:   11/03/17 1418 11/03/17 2123 11/04/17 0135 11/04/17 0518  BP: (!) 170/87 (!) 189/91 (!) 168/91 137/78  Pulse: 68 90 74 76  Resp: 16 17  15   Temp: 98.6 F (37 C) 98.6 F (37 C)  98.8 F (37.1 C)  TempSrc: Oral Oral  Oral  SpO2: 100% 98% 98% 98%  Weight:      Height:       Body mass index is 43.95 kg/m.  Physical Exam  Constitutional: He is oriented to person, place, and time and well-developed, well-nourished, and in no distress.  Seated in chair and appears  comfortable  HENT:  Mouth/Throat: Oropharynx is clear and moist. No oral lesions. Normal dentition. No dental caries.  Eyes: No scleral icterus.  Left eye deviated gaze  Cardiovascular: Normal rate, regular rhythm and normal heart sounds.  No murmur heard. Pulmonary/Chest: Effort normal and breath sounds normal. No respiratory distress.  Abdominal: Soft. He exhibits no distension. There is no tenderness.  Musculoskeletal: He exhibits edema (generalized edema noted).  Bilateral legs wrapped. Capillary refill, temp and skin color normal and unchanged from yesterday.   Lymphadenopathy:    He has no cervical adenopathy.  Neurological: He is alert and oriented to person, place, and time.  Skin: Skin is warm and dry. No rash noted.  Psychiatric: Mood and affect normal.    Lab Results Lab Results  Component Value Date  WBC 6.2 11/04/2017   HGB 9.4 (L) 11/04/2017   HCT 28.5 (L) 11/04/2017   MCV 89.9 11/04/2017   PLT 134 (L) 11/04/2017    Lab Results  Component Value Date   CREATININE 1.26 (H) 11/04/2017   BUN 5 (L) 11/04/2017   NA 138 11/04/2017   K 2.6 (LL) 11/04/2017   CL 108 11/04/2017   CO2 23 11/04/2017    Lab Results  Component Value Date   ALT 7 (L) 10/31/2017   AST 20 10/31/2017   ALKPHOS 54 10/31/2017   BILITOT 0.8 10/31/2017     Microbiology: Recent Results (from the past 240 hour(s))  Culture, blood (Routine x 2)     Status: None   Collection Time: 10/30/17  8:20 PM  Result Value Ref Range Status   Specimen Description   Final    BLOOD RIGHT ARM Performed at [facility], [place] [address] [place], [place] [address]    Special Requests   Final    BOTTLES DRAWN AEROBIC AND ANAEROBIC Blood Culture results may not be optimal due to an excessive volume of blood received in culture bottles Performed at [facility] [address] [place], [place] [address]    Culture   Final    NO GROWTH 5 DAYS Performed at [facility], [place] [address], [place], [place] [address]    Report Status 11/04/2017 FINAL  Final  Culture, blood (Routine x 2)     Status: None   Collection Time: 10/30/17  9:21 PM  Result Value Ref Range Status   Specimen Description   Final    BLOOD LEFT FOREARM Performed at [facility] [address], [place], [place] [address]    Special Requests   Final    BOTTLES DRAWN AEROBIC AND ANAEROBIC Blood Culture adequate volume   Culture   Final    NO GROWTH 5 DAYS Performed at [facility], [place] [address] [place], [place] [address]    Report Status 11/04/2017 FINAL  Final  Urine culture     Status: Abnormal   Collection Time: 10/30/17 11:32 PM  Result Value Ref Range Status   Specimen Description   Final    URINE, CLEAN CATCH Performed at [facility], [place] [address], [place], [place] [address]    Special Requests   Final    Normal Performed at [facility], [place] [address], [place], [place] [address]    Culture (A)  Final    50,000 COLONIES/mL DIPHTHEROIDS(CORYNEBACTERIUM SPECIES) Standardized susceptibility testing for this organism is not available. Performed at [facility], [place] [address] [place], [place] [address]    Report Status 11/01/2017 FINAL  Final  MRSA PCR Screening     Status: None   Collection Time: 10/31/17  2:54 AM  Result Value Ref Range Status   MRSA by PCR NEGATIVE NEGATIVE Final    Comment:        The GeneXpert MRSA Assay (FDA approved for NASAL specimens only), is one component of a comprehensive MRSA colonization surveillance program. It is not intended to diagnose MRSA infection nor to guide or monitor treatment for MRSA infections. Performed at [facility], [place] [address] [place], [place] [address]   Urine Culture     Status: None   Collection Time: 11/03/17  1:42 AM  Result Value Ref Range Status   Specimen Description URINE, RANDOM  Final   Special Requests  NONE   Final   Culture   Final    NO GROWTH Performed at [facility], [place] [address], [place], [place] [address]    Report Status 11/04/2017 FINAL  Final    [name], MSN, NP-C [facility] for Infectious Disease [facility] Cell: [phone] Pager: [phone]  11/04/2017  1:36 PM

## 2017-11-04 NOTE — Progress Notes (Signed)
CRITICAL VALUE ALERT  Critical Value:  Potassium  Date & Time Notied:  11/04/2017 0544  Provider Notified: Tylene Fantasia NP  Orders Received/Actions taken: Potassium chloride CR 34mEq Q4H

## 2017-11-04 NOTE — Evaluation (Signed)
Physical Therapy Evaluation Patient Details Name: Timothy Clark MRN: [id number] DOB: 1970/10/18 Today's Date: 11/04/2017   History of Present Illness  Pt is a 47 y/o male admitted secondary to sepsis. Per notes, suspect it is due to R foot infection following amputation. PMH includes DM, HTN, CKD 3, legally blind, R 1st ray partial amputation s/p I and D.   Clinical Impression  Pt admitted secondary to problem above with deficits below. Pt requiring min to min guard for mobility with RW this session. Pt legally blind, so required multimodal directional/sequencing cues with use of RW. Pt reports he prefers to go home at d/c and will have 24/7 support. Will continue to follow acutely to maximize functional mobility independence and safety.     Follow Up Recommendations Home health PT;Supervision for mobility/OOB    Equipment Recommendations  Rolling walker with 5" wheels    Recommendations for Other Services OT consult     Precautions / Restrictions Precautions Precautions: Fall;Other (comment) Precaution Comments: Legally blind  Required Braces or Orthoses: Other Brace/Splint Other Brace/Splint: reports he has a walker boot, but did not have it with him Restrictions Weight Bearing Restrictions: No      Mobility  Bed Mobility               General bed mobility comments: In chair upon entry.   Transfers Overall transfer level: Needs assistance Equipment used: Rolling walker (2 wheeled) Transfers: Sit to/from Stand Sit to Stand: Min assist;Max assist         General transfer comment: Attempted to stand from chair X 2, however, pt very unsteady and unable to stand with max A. Verbal cues to scoot out to edge of chair and bring trunk anteriorly, and pt with improved technique and required min A to stand.   Ambulation/Gait Ambulation/Gait assistance: Min guard;Min assist Ambulation Distance (Feet): 25 Feet Assistive device: Rolling walker (2 wheeled) Gait  Pattern/deviations: Step-through pattern;Decreased stride length;Decreased weight shift to right Gait velocity: Decreased  Gait velocity interpretation: Below normal speed for age/gender General Gait Details: Slow, cautious gait. Slightly unsteady. Required verbal/manual cues for upright posture and proimity to device. Distance limited as pt needing to have BM. Required verbal directional cues secondary to blindness.   Stairs            Wheelchair Mobility    Modified Rankin (Stroke Patients Only)       Balance Overall balance assessment: Needs assistance Sitting-balance support: No upper extremity supported;Feet supported Sitting balance-Leahy Scale: Good     Standing balance support: Bilateral upper extremity supported;Single extremity supported;During functional activity Standing balance-Leahy Scale: Fair Standing balance comment: Able to maintain static standing with min guard A without UE support to use urinal                              Pertinent Vitals/Pain Pain Assessment: No/denies pain    Home Living Family/patient expects to be discharged to:: Private residence Living Arrangements: Parent Available Help at Discharge: Family;Available 24 hours/day Type of Home: House Home Access: Stairs to enter Entrance Stairs-Rails: Right;Left;Can reach both Entrance Stairs-Number of Steps: 1 Home Layout: One level Home Equipment: Cane - single point;Grab bars - tub/shower      Prior Function Level of Independence: Independent               Hand Dominance   Dominant Hand: Right    Extremity/Trunk Assessment   Upper Extremity Assessment Upper  Extremity Assessment: Defer to OT evaluation    Lower Extremity Assessment Lower Extremity Assessment: RLE deficits/detail;LLE deficits/detail;Generalized weakness RLE Deficits / Details: RLE partial 1st ray amputation. Wrapped from wound care.  LLE Deficits / Details: LLE wrapped from wound care from  feet to below the knee.     Cervical / Trunk Assessment Cervical / Trunk Assessment: Kyphotic  Communication   Communication: No difficulties  Cognition Arousal/Alertness: Awake/alert Behavior During Therapy: WFL for tasks assessed/performed Overall Cognitive Status: Within Functional Limits for tasks assessed                                        General Comments      Exercises     Assessment/Plan    PT Assessment Patient needs continued PT services  PT Problem List Decreased strength;Decreased activity tolerance;Decreased balance;Decreased mobility;Decreased knowledge of use of DME;Decreased knowledge of precautions       PT Treatment Interventions DME instruction;Gait training;Functional mobility training;Stair training;Therapeutic activities;Therapeutic exercise;Neuromuscular re-education;Balance training;Patient/family education    PT Goals (Current goals can be found in the Care Plan section)  Acute Rehab PT Goals Patient Stated Goal: to go home  PT Goal Formulation: With patient Time For Goal Achievement: 11/18/17 Potential to Achieve Goals: Good    Frequency Min 3X/week   Barriers to discharge        Co-evaluation               AM-PAC PT "6 Clicks" Daily Activity  Outcome Measure Difficulty turning over in bed (including adjusting bedclothes, sheets and blankets)?: A Little Difficulty moving from lying on back to sitting on the side of the bed? : Unable Difficulty sitting down on and standing up from a chair with arms (e.g., wheelchair, bedside commode, etc,.)?: Unable Help needed moving to and from a bed to chair (including a wheelchair)?: A Little Help needed walking in hospital room?: A Little Help needed climbing 3-5 steps with a railing? : A Lot 6 Click Score: 13    End of Session Equipment Utilized During Treatment: Gait belt Activity Tolerance: Patient tolerated treatment well Patient left: in chair;with call bell/phone  within reach;with nursing/sitter in room Nurse Communication: Mobility status PT Visit Diagnosis: Unsteadiness on feet (R26.81);Muscle weakness (generalized) (M62.81);Difficulty in walking, not elsewhere classified (R26.2)    Time: 2248-2500 PT Time Calculation (min) (ACUTE ONLY): 29 min   Charges:   PT Evaluation $PT Eval Low Complexity: 1 Low PT Treatments $Therapeutic Activity: 8-22 mins   PT G Codes:        Leighton Ruff, PT, DPT  Acute Rehabilitation Services  Pager: [phone]   Rudean Hitt 11/04/2017, 2:30 PM

## 2017-11-04 NOTE — Progress Notes (Signed)
PROGRESS NOTE    Timothy Clark  OBS:[id number] DOB: 06-Feb-1971 DOA: 10/30/2017 PCP: Rosalee Kaufman, PA-C      Brief Narrative:  Timothy Clark is a 47 year old male with DM, HTN, gastroparesis and blindness.  The patient had amputation of the right first ray in January 2019.  He was readmitted in February for flare of chronic recurrent gastroparesis.  At some point after discharge in February, he developed large ulcerations on his right mid foot, and when seen by DPM on 10/12/17, was referred to Vascular surgery (felt they appeared ischemic).  Then in the few days before admission, patient developed fever, malaise, recurrent vomiting, and redness/swelling of his right foot.  Admitted for cellulitis/sepsis and started on vancomycin, aztreonam, and Flagyl.    Vascular surgery were consulted, ordered duplex arterial studies of the right leg which indicated no large vessel occlusion, felt that the ulcers were largely small vessel disease and neuropathy related.  Orthopedics was consulted, they agreed that the wounds were likely small vessel disease and neuropathy related, conservative measures should be tried, followed by DKA if failure.  Infectious disease were consulted.     Assessment & Plan:  Sepsis from diabetic foot infection -Continue IV antibiotics, will switch to ertapenem per infectious disease recommendations  Acute on chronic diastolic CHF This is new.  Per recent echo he has EF 65% but grade II DD.  Currently with dyspnea on exertion, scrotal edema. -Replete potassium -After that, will give IV Lasix -Strict I/Os, daily weights -Daily BMP  Diabetes with polyneuropathy HgbA1c >12% back in 2011, but since 2016 have been in 6.5-7.5 range, indicating improved control.  He has however, severe insensate neuropathy.   -Continue Lantus -Continue SSI  Hypertension -Continue metoprolol, hydralazine, clonidine -PRN hydralazine is available -Continue statin  Chronic  kidney disease stage III Stable  Gastroparesis Irritable bowel syndrome -Continue dicyclomine, Reglan, PPI, sucralfate  Glaucoma -Continue eyedrops   Anemia of chronic kidney disease Hgb stable.        DVT prophylaxis: Heparin Code Status: FULL Family Communication: None present MDM and disposition Plan: The below labs and imaging reports were reviewed.  The patient was admitted for limb threatening infection.  He continues on IV antibiotics for this, and is improving.  His treatment for it has resulted in new diastolic CHF flare, which will be treated with IV lasix.     To home within a few days, with HHPT.      Consultants:   ID  Ortho  Vascular  Procedures:   Duplex US of RLE Final Interpretation: Right: No obvious evidence of occlusion or significant stenosis    noted in the visualized arteries of the right lower    extremity.  US abdomen 3/23 IMPRESSION: Normal right upper quadrant ultrasound.    Radiograph right foot 3/22 FINDINGS: No acute fracture is seen. Patient is status post amputation of first digit at the level of the mid first metatarsal. Increased bony proliferative changes about the cut margin of the first metatarsal with indistinct appearance of the cortex on the medial side. Vascular calcifications. No gas in the soft tissues  IMPRESSION: Status post amputation of first digit at the level of the mid first metatarsal. Indistinct appearance of the medial cortex of the shaft of the residual metatarsal is concerning for ongoing osteomyelitis.     Antimicrobials:   Vancomycin3/22 >> 3/27  Aztreonam3/22 >> 3/27  Metronidazole 3/22 >> 3/27  Ertapenem 3/27 >>   Subjective: Feeling somewhat better.  RIght foot is  numb.  No fever, vomiting.  Noticed swelling everywhere and in the scrotum, worsening ove rthe last few days, also in that time frame has been having some eprsistent SOB.  Objective: Vitals:   11/03/17  2123 11/04/17 0135 11/04/17 0518 11/04/17 1338  BP: (!) 189/91 (!) 168/91 137/78 (!) 162/84  Pulse: 90 74 76 78  Resp: 17  15 18   Temp: 98.6 F (37 C)  98.8 F (37.1 C) 98.3 F (36.8 C)  TempSrc: Oral  Oral Oral  SpO2: 98% 98% 98% 98%  Weight:      Height:        Intake/Output Summary (Last 24 hours) at 11/04/2017 1436 Last data filed at 11/04/2017 0908 Gross per 24 hour  Intake 1602 ml  Output 2950 ml  Net -1348 ml   Filed Weights   10/30/17 2008 11/01/17 1320  Weight: (!) 143.8 kg (317 lb) (!) 147 kg (324 lb 1.2 oz)    Examination: General appearance: BMI 39 adult male, alert and in no acute distress.  Sitting in chair. HEENT:   Lips moist.   Skin: Warm and dry.  No jaundice.  No suspicious rashes or lesions. Cardiac: RRR, nl S1-S2, no murmurs appreciated.  Capillary refill is brisk.  JVP not visible.  Pitting LE edema, also scrotal edema.  Radial pulses 2+ and symmetric. Respiratory: Normal respiratory rate and rhythm.  CTAB without rales or wheezes. Abdomen: Abdomen soft.  No TTP. No ascites, distension, hepatosplenomegaly.   MSK: No deformities or effusions.  The bilateral lower extremities are in [place] boots.  The toes appear warm and have good movement.  He is numb in the toes of the right foot. Neuro: Awake and alert.  EOMI, moves all extremities. Speech fluent.    Psych: Sensorium intact and responding to questions, attention normal. Affect normal.  Judgment and insight appear normal.    Data Reviewed: I have personally reviewed following labs and imaging studies:  CBC: Recent Labs  Lab 10/30/17 2020 10/31/17 0311 11/01/17 0731 11/02/17 0923 11/04/17 0439  WBC 9.3 8.1 5.9 7.0 6.2  NEUTROABS 7.6  --   --   --   --   HGB 9.4* 8.5* 9.4* 9.7* 9.4*  HCT 27.3* 24.8* 27.9* 28.9* 28.5*  MCV 88.6 89.5 91.8 89.8 89.9  PLT 169 156 142* 164 470*   Basic Metabolic Panel: Recent Labs  Lab 10/30/17 2020 10/31/17 0630 11/01/17 0731 11/01/17 1257 11/02/17 0923  11/04/17 0439  NA 142 138 141  --  141 138  K 2.7* 3.0* 2.8* 3.2* 3.4* 2.6*  CL 105 103 113*  --  112* 108  CO2 25 25 22   --  19* 23  GLUCOSE 159* 87 124*  --  95 87  BUN 8 6 8   --  7 5*  CREATININE 1.51* 1.37* 1.36*  --  1.28* 1.26*  CALCIUM 8.5* 7.5* 7.6*  --  8.0* 7.7*  MG  --   --   --  1.7  --   --    GFR: Estimated Creatinine Clearance: 108 mL/min (A) (by C-G formula based on SCr of 1.26 mg/dL (H)). Liver Function Tests: Recent Labs  Lab 10/30/17 2020 10/31/17 0630  AST 25 20  ALT 10* 7*  ALKPHOS 59 54  BILITOT 0.8 0.8  PROT 6.0* 5.1*  ALBUMIN 2.7* 2.2*   No results for input(s): LIPASE, AMYLASE in the last 168 hours. No results for input(s): AMMONIA in the last 168 hours. Coagulation Profile: Recent Labs  Lab  10/30/17 2020  INR 1.25   Cardiac Enzymes: No results for input(s): CKTOTAL, CKMB, CKMBINDEX, TROPONINI in the last 168 hours. BNP (last 3 results) No results for input(s): PROBNP in the last 8760 hours. HbA1C: No results for input(s): HGBA1C in the last 72 hours. CBG: Recent Labs  Lab 11/03/17 1216 11/03/17 1651 11/03/17 2127 11/04/17 0810 11/04/17 1211  GLUCAP 109* 126* 138* 73 78   Lipid Profile: No results for input(s): CHOL, HDL, LDLCALC, TRIG, CHOLHDL, LDLDIRECT in the last 72 hours. Thyroid Function Tests: No results for input(s): TSH, T4TOTAL, FREET4, T3FREE, THYROIDAB in the last 72 hours. Anemia Panel: No results for input(s): VITAMINB12, FOLATE, FERRITIN, TIBC, IRON, RETICCTPCT in the last 72 hours. Urine analysis:    Component Value Date/Time   COLORURINE YELLOW 10/30/2017 2332   APPEARANCEUR HAZY (A) 10/30/2017 2332   LABSPEC 1.013 10/30/2017 2332   PHURINE 7.0 10/30/2017 2332   GLUCOSEU 150 (A) 10/30/2017 2332   HGBUR SMALL (A) 10/30/2017 2332   BILIRUBINUR NEGATIVE 10/30/2017 2332   KETONESUR 5 (A) 10/30/2017 2332   PROTEINUR >=300 (A) 10/30/2017 2332   UROBILINOGEN 0.2 06/07/2015 1353   NITRITE NEGATIVE 10/30/2017 2332     LEUKOCYTESUR NEGATIVE 10/30/2017 2332   Sepsis Labs: @LABRCNTIP (procalcitonin:4,lacticacidven:4)  ) Recent Results (from the past 240 hour(s))  Culture, blood (Routine x 2)     Status: None   Collection Time: 10/30/17  8:20 PM  Result Value Ref Range Status   Specimen Description   Final    BLOOD RIGHT ARM Performed at [facility] [address] [place], [place] [address]    Special Requests   Final    BOTTLES DRAWN AEROBIC AND ANAEROBIC Blood Culture results may not be optimal due to an excessive volume of blood received in culture bottles Performed at [facility] [address] [place], [place] [address]    Culture   Final    NO GROWTH 5 DAYS Performed at [facility], [place] [address] [place], [place] [address]    Report Status 11/04/2017 FINAL  Final  Culture, blood (Routine x 2)     Status: None   Collection Time: 10/30/17  9:21 PM  Result Value Ref Range Status   Specimen Description   Final    BLOOD LEFT FOREARM Performed at [facility] [address] [place], [place] [address]    Special Requests   Final    BOTTLES DRAWN AEROBIC AND ANAEROBIC Blood Culture adequate volume   Culture   Final    NO GROWTH 5 DAYS Performed at [facility], [place] [address] [place], [place] [address]    Report Status 11/04/2017 FINAL  Final  Urine culture     Status: Abnormal   Collection Time: 10/30/17 11:32 PM  Result Value Ref Range Status   Specimen Description   Final    URINE, CLEAN CATCH Performed at [facility], [place] [address] [place], [place] [address]    Special Requests   Final    Normal Performed at [facility], [place] [address], [place], [place] [address]    Culture (A)  Final    50,000 COLONIES/mL DIPHTHEROIDS(CORYNEBACTERIUM SPECIES) Standardized susceptibility testing for this organism is not available. Performed at [facility], [place]  [address] [place], [place] [address]    Report Status 11/01/2017 FINAL  Final  MRSA PCR Screening     Status: None   Collection Time: 10/31/17  2:54 AM  Result Value Ref Range Status   MRSA by PCR NEGATIVE NEGATIVE Final    Comment:        The GeneXpert MRSA Assay (FDA approved for NASAL specimens only), is one component of a comprehensive MRSA colonization surveillance program. It is not intended to diagnose MRSA infection nor to guide or monitor treatment for MRSA infections. Performed at [facility], [place] [address], [place], [place] [address]   Urine Culture     Status: None   Collection Time: 11/03/17  1:42 AM  Result Value Ref Range Status   Specimen Description URINE, RANDOM  Final   Special Requests NONE  Final   Culture   Final    NO GROWTH Performed at [facility], [place] [address], [place], [place] [address]    Report Status 11/04/2017 FINAL  Final         Radiology Studies: No results found.      Scheduled Meds: . atorvastatin  10 mg Oral QPM  . brimonidine  1 drop Both Eyes TID  . cloNIDine  0.1 mg Oral BID  . collagenase   Topical Daily  . dicyclomine  10 mg Oral TID AC & HS  . feeding supplement (PRO-STAT SUGAR FREE 64)  30 mL Oral BID  . folic acid  1 mg Oral Daily  . heparin  5,000 Units Subcutaneous Q8H  . hydrALAZINE  50 mg Oral Q8H  . insulin aspart  0-15 Units Subcutaneous TID WC  . insulin glargine  15 Units Subcutaneous BID  . metoCLOPramide  10 mg Oral TID AC  . metoprolol succinate  25 mg Oral Daily  . pantoprazole  40 mg Oral BID AC  . potassium chloride  40 mEq Oral BID  . sucralfate  1 g Oral QID  . thiamine  100 mg Oral Daily  . timolol  1 drop Both Eyes BID   Continuous Infusions: . ertapenem    . sodium chloride Stopped (10/30/17 2154)     LOS: 5 days    Time spent: 35 minutes    Edwin Dada, MD [facility] 11/04/2017, 2:36 PM     Pager [phone] --- please page though AMION:    [email or web address] Password [email or web address] If 7PM-7AM, please contact night-coverage

## 2017-11-04 NOTE — Consult Note (Signed)
[facility] Nurse wound consult note Patient states all is going well with the compression wraps I placed yesterday.  He is not experiencing any pain. Thank you for the consult.  Discussed plan of care with the patient and bedside nurse.  [facility] nurse will not follow at this time.  Please re-consult the [place] team if needed.  Val Riles, RN, MSN, CWOCN, CNS-BC, pager [phone]

## 2017-11-05 LAB — BASIC METABOLIC PANEL
Anion gap: 7 (ref 5–15)
BUN: 5 mg/dL — ABNORMAL LOW (ref 6–20)
CO2: 26 mmol/L (ref 22–32)
Calcium: 7.9 mg/dL — ABNORMAL LOW (ref 8.9–10.3)
Chloride: 109 mmol/L (ref 101–111)
Creatinine, Ser: 1.34 mg/dL — ABNORMAL HIGH (ref 0.61–1.24)
GFR calc Af Amer: >60 mL/min (ref >60)
GFR calc non Af Amer: >60 mL/min (ref >60)
Glucose, Bld: 72 mg/dL (ref 65–99)
Potassium: 2.7 mmol/L — CL (ref 3.5–5.1)
Sodium: 142 mmol/L (ref 135–145)

## 2017-11-05 LAB — GLUCOSE, CAPILLARY
Glucose-Capillary: 71 mg/dL (ref 65–99)
Glucose-Capillary: 135 mg/dL — ABNORMAL HIGH (ref 65–99)
Glucose-Capillary: 98 mg/dL (ref 65–99)
Glucose-Capillary: 90 mg/dL (ref 65–99)

## 2017-11-05 MED ORDER — CLINDAMYCIN HCL 300 MG PO CAPS
300.0000 mg | ORAL_CAPSULE | Freq: Four times a day (QID) | ORAL | Status: DC
  Administered 2017-11-05 – 2017-11-06 (×3): 300 mg via ORAL
  Filled 2017-11-05 (×3): qty 1

## 2017-11-05 MED ORDER — POTASSIUM CHLORIDE CRYS ER 20 MEQ PO TBCR
30.0000 meq | EXTENDED_RELEASE_TABLET | ORAL | Status: AC
  Administered 2017-11-05 (×2): 30 meq via ORAL
  Filled 2017-11-05 (×2): qty 1

## 2017-11-05 NOTE — Progress Notes (Signed)
CRITICAL VALUE ALERT  Critical Value: Potassium 2.7  Date & Time Notied:  11/05/17 at 0619  Provider Notified: Forrest Moron NP  Orders Received/Actions taken: yes

## 2017-11-05 NOTE — Progress Notes (Signed)
Physical Therapy Treatment Patient Details Name: Timothy Clark MRN: [id number] DOB: November 26, 1970 Today's Date: 11/05/2017    History of Present Illness Pt is a 47 y/o male admitted secondary to sepsis. Per notes, suspect it is due to R foot infection following amputation. PMH includes DM, HTN, CKD 3, legally blind, R 1st ray partial amputation s/p I and D.     PT Comments    Patient received in bed, very pleasant and willing to participate in PT evaluation today. His family has not yet brought his walker boot. He reports that while he is legally blind, he can see enough to navigate around close obstacles, and was able to correctly read the calendar date on the wall while laying in bed. He is able to perform functional bed mobility with S and increased time/effort today, and benefits from min guard for functional transfers and gait approximately 164f with RW. Noted wide based gait pattern with feet outside of walker, attempted to correct but patient reports this is due to excessive fluid buildup in his groin/scrotal area and putting his legs closer together is difficult/uncomfortable due to this. He was left up in the chair with all needs met, nursing student aware of patient status.   Follow Up Recommendations  Home health PT;Supervision for mobility/OOB     Equipment Recommendations  Rolling walker with 5" wheels    Recommendations for Other Services OT consult     Precautions / Restrictions Precautions Precautions: Fall;Other (comment) Precaution Comments: Legally blind  Required Braces or Orthoses: Other Brace/Splint Other Brace/Splint: reports he has a walker boot, but did not have it with him Restrictions Weight Bearing Restrictions: No    Mobility  Bed Mobility Overal bed mobility: Needs Assistance Bed Mobility: Supine to Sit     Supine to sit: Supervision     General bed mobility comments: S, extended time and increased effort   Transfers Overall transfer level:  Needs assistance Equipment used: Rolling walker (2 wheeled) Transfers: Sit to/from Stand Sit to Stand: Min guard         General transfer comment: required rocking and multiple attempts to stand from low bed but able to do so with min guard/without additional assist from PT. Requires cues to gain balance before ambulating today   Ambulation/Gait Ambulation/Gait assistance: Min guard Ambulation Distance (Feet): 100 Feet Assistive device: Rolling walker (2 wheeled) Gait Pattern/deviations: Step-through pattern;Decreased stride length;Decreased weight shift to right;Wide base of support Gait velocity: Decreased    General Gait Details: slow gait speed with wide BOS with feet outside of walker, attempted to verbally correct but patient reports this is due to fluid buildup in his groin/scrotum area and bringing his legs closer together is very uncomfortable right now. Cues for safety RW especially with turns    Stairs            Wheelchair Mobility    Modified Rankin (Stroke Patients Only)       Balance Overall balance assessment: Needs assistance Sitting-balance support: No upper extremity supported;Feet supported Sitting balance-Leahy Scale: Good     Standing balance support: Bilateral upper extremity supported;During functional activity Standing balance-Leahy Scale: Fair Standing balance comment: reliant on external support                             Cognition Arousal/Alertness: Awake/alert Behavior During Therapy: WFL for tasks assessed/performed Overall Cognitive Status: Within Functional Limits for tasks assessed  Exercises      General Comments General comments (skin integrity, edema, etc.): patient is legally blind but can see far enough in front of him/can see contrast enough to be able to walk with minimal cues for navigation; able to correctly read calendar date from his bed without PT  assist       Pertinent Vitals/Pain Pain Assessment: No/denies pain    Home Living                      Prior Function            PT Goals (current goals can now be found in the care plan section) Acute Rehab PT Goals Patient Stated Goal: to go home  PT Goal Formulation: With patient Time For Goal Achievement: 11/18/17 Potential to Achieve Goals: Good Progress towards PT goals: Progressing toward goals    Frequency    Min 3X/week      PT Plan Current plan remains appropriate    Co-evaluation              AM-PAC PT "6 Clicks" Daily Activity  Outcome Measure  Difficulty turning over in bed (including adjusting bedclothes, sheets and blankets)?: Unable Difficulty moving from lying on back to sitting on the side of the bed? : Unable Difficulty sitting down on and standing up from a chair with arms (e.g., wheelchair, bedside commode, etc,.)?: Unable Help needed moving to and from a bed to chair (including a wheelchair)?: A Little Help needed walking in hospital room?: A Little Help needed climbing 3-5 steps with a railing? : A Lot 6 Click Score: 11    End of Session Equipment Utilized During Treatment: Gait belt Activity Tolerance: Patient tolerated treatment well Patient left: in chair;with call bell/phone within reach Nurse Communication: Mobility status(nursing student educated on gait pattern/quality) PT Visit Diagnosis: Unsteadiness on feet (R26.81);Muscle weakness (generalized) (M62.81);Difficulty in walking, not elsewhere classified (R26.2)     Time: 7121-9758 PT Time Calculation (min) (ACUTE ONLY): 27 min  Charges:  $Gait Training: 8-22 mins $Therapeutic Activity: 8-22 mins                    G Codes:       Deniece Ree PT, DPT, CBIS  Supplemental Physical Therapist [place]   Pager [phone]

## 2017-11-05 NOTE — Care Management Important Message (Signed)
Important Message  Patient Details  Name: Timothy Clark MRN: [id number] Date of Birth: 1971/08/06   Medicare Important Message Given:  Yes    Orbie Pyo 11/05/2017, 1:38 PM

## 2017-11-05 NOTE — Care Management Note (Addendum)
Case Management Note  Patient Details  Name: Timothy Clark MRN: [id number] Date of Birth: Apr 19, 1971  Subjective/Objective:  47 yr old gentleman admitted with osteomyelitis and diabetic foot ulcer.   Action/Plan: Case manager spoke with patient concerning discharge plan. Patient lives in [place] but says he will be staying with his father and sister in [place], [place]. Case manager discussed [place]. Patient says he tried [facility] but cant because he is out of [place]. Case manager contacted Lauren with [facility] in [place] and inquired about this. Per Timothy Clark, patient is out of net work and will need to change his address to the address in [place], [place] with [employer] before they can provide Home Health, they can not accept [place] insurance. Case manager will discuss with patient.    Expected Discharge Date:  11/06/17            Expected Discharge Plan:  [place]  In-House Referral:  NA  Discharge planning Services  CM Consult  Post Acute Care Choice:  Home Health, Durable Medical Equipment Choice offered to:  Patient  DME Arranged:  [facility] Rile platform DME Agency:  [place]:  PT,RN [place] Agency:     Status of Service:  Completed. If discussed at [place] of Stay Meetings, dates discussed:    Additional Comments:Case manager called [facility] in [place], they will accept patient. Case Manager will fax [facility] to [facility]:  Phone: [place]:  [phone]  . Patient will discharge on Oral antibiotics. Case manager appreciates Bonney Leitz assistance with arranging for possible home IV antbiotics.  Ninfa Meeker, RN 11/05/2017, 3:47 PM

## 2017-11-05 NOTE — Evaluation (Signed)
Occupational Therapy Evaluation Patient Details Name: Timothy Clark MRN: [id number] DOB: [birth year] Today's Date: 11/05/2017    History of Present Illness Pt is a 47 y/o male admitted secondary to sepsis. Per notes, suspect it is due to R foot infection following amputation. PMH includes DM, HTN, CKD 3, legally blind, R 1st ray partial amputation s/p I and D.    Clinical Impression   Pt with decline in function and safety with ADLs and ADL mobility with decreased balance, endurance pain/edema in groin area. Pt would benefit from acute OT services to address impairments to maximize level of function and safety    Follow Up Recommendations  No OT follow up;Supervision - Intermittent    Equipment Recommendations  Other (comment)(ADL A/E kit)    Recommendations for Other Services       Precautions / Restrictions Precautions Precautions: Fall;Other (comment) Precaution Comments: Legally blind  Required Braces or Orthoses: Other Brace/Splint Other Brace/Splint: reports he has a walker boot, but did not have it with him Restrictions Weight Bearing Restrictions: No      Mobility Bed Mobility Overal bed mobility: Needs Assistance Bed Mobility: Supine to Sit     Supine to sit: Supervision     General bed mobility comments: pt up in recliner upo  Transfers Overall transfer level: Needs assistance Equipment used: Rolling walker (2 wheeled) Transfers: Sit to/from Stand Sit to Stand: Min guard         General transfer comment: required rocking and multiple attempts to stand from low bed but able to do so with min guard/without additional assist from PT. Requires cues to gain balance before ambulating today     Balance Overall balance assessment: Needs assistance Sitting-balance support: No upper extremity supported;Feet supported Sitting balance-Leahy Scale: Good     Standing balance support: Bilateral upper extremity supported;During functional activity Standing  balance-Leahy Scale: Fair Standing balance comment: reliant on external support                            ADL either performed or assessed with clinical judgement   ADL Overall ADL's : Needs assistance/impaired Eating/Feeding: Independent;Sitting   Grooming: Wash/dry hands;Wash/dry face;Min guard;Standing   Upper Body Bathing: Set up;Supervision/ safety   Lower Body Bathing: Maximal assistance   Upper Body Dressing : Supervision/safety;Set up   Lower Body Dressing: Maximal assistance   Toilet Transfer: Min guard;Ambulation;RW;Comfort height toilet;Grab bars   Toileting- Clothing Manipulation and Hygiene: Moderate assistance   Tub/ Shower Transfer: Min guard;Ambulation;Rolling walker;3 in 1   Functional mobility during ADLs: Min guard;Rolling walker General ADL Comments: pt with swelling and pain and pain in groin area     Vision Patient Visual Report: No change from baseline       Perception     Praxis      Pertinent Vitals/Pain Pain Assessment: No/denies pain     Hand Dominance Right   Extremity/Trunk Assessment Upper Extremity Assessment Upper Extremity Assessment: Overall WFL for tasks assessed   Lower Extremity Assessment Lower Extremity Assessment: Defer to PT evaluation   Cervical / Trunk Assessment Cervical / Trunk Assessment: Kyphotic   Communication Communication Communication: No difficulties   Cognition Arousal/Alertness: Awake/alert Behavior During Therapy: WFL for tasks assessed/performed Overall Cognitive Status: Within Functional Limits for tasks assessed  General Comments  patient is legally blind but can see far enough in front of him/can see contrast enough to be able to walk with minimal cues for navigation; able to correctly read calendar date from his bed without PT assist     Exercises     Shoulder Instructions      Home Living Family/patient expects to be  discharged to:: Private residence Living Arrangements: Parent Available Help at Discharge: Family;Available 24 hours/day Type of Home: House Home Access: Stairs to enter [employer] of Steps: 1 Entrance Stairs-Rails: Right;Left;Can reach both       Bathroom Shower/Tub: Occupational psychologist: Standard     Home Equipment: Cane - single point;Grab bars - tub/shower          Prior Functioning/Environment                   OT Problem List: Decreased activity tolerance;Pain;Decreased coordination;Impaired balance (sitting and/or standing);Decreased knowledge of use of DME or AE      OT Treatment/Interventions: Self-care/ADL training;DME and/or AE instruction;Therapeutic activities;Therapeutic exercise;Neuromuscular education;Patient/family education    OT Goals(Current goals can be found in the care plan section) Acute Rehab OT Goals Patient Stated Goal: to go home  OT Goal Formulation: With patient Time For Goal Achievement: 11/19/17 Potential to Achieve Goals: Good ADL Goals Pt Will Perform Grooming: with set-up;with supervision;standing Pt Will Perform Lower Body Bathing: with mod assist;with min assist;with caregiver independent in assisting;with adaptive equipment Pt Will Perform Lower Body Dressing: with mod assist;with min assist;with caregiver independent in assisting;with adaptive equipment Pt Will Transfer to Toilet: with supervision;ambulating Pt Will Perform Toileting - Clothing Manipulation and hygiene: with min assist;with min guard assist  OT Frequency: Min 2X/week   Barriers to D/C:    no barriers       Co-evaluation              AM-PAC PT "6 Clicks" Daily Activity     Outcome Measure Help from another person eating meals?: None Help from another person taking care of personal grooming?: A Little Help from another person toileting, which includes using toliet, bedpan, or urinal?: A Little Help from another person bathing  (including washing, rinsing, drying)?: A Lot Help from another person to put on and taking off regular upper body clothing?: None Help from another person to put on and taking off regular lower body clothing?: A Lot 6 Click Score: 18   End of Session Equipment Utilized During Treatment: Rolling walker  Activity Tolerance: Patient tolerated treatment well Patient left: in chair  OT Visit Diagnosis: Unsteadiness on feet (R26.81);Other abnormalities of gait and mobility (R26.89);Pain                Time: 1242-1319 OT Time Calculation (min): 37 min Charges:  OT General Charges $OT Visit: 1 Visit OT Evaluation $OT Eval Low Complexity: 1 Low OT Treatments $Therapeutic Activity: 8-22 mins G-Codes: OT G-codes **NOT FOR INPATIENT CLASS** Functional Assessment Tool Used: AM-PAC 6 Clicks Daily Activity     [name] 11/05/2017, 1:46 PM

## 2017-11-05 NOTE — Progress Notes (Signed)
PROGRESS NOTE    Timothy Clark  ZOX:[id number] DOB: Nov 23, 1970 DOA: 10/30/2017 PCP: Rosalee Kaufman, PA-C      Brief Narrative:  Timothy Clark is a 47 year old male with DM, HTN, gastroparesis and blindness.  The patient had amputation of the right first ray in January 2019.  He was readmitted in February for flare of chronic recurrent gastroparesis.  At some point after discharge in February, he developed large ulcerations on his right mid foot, and when seen by DPM on 10/12/17, was referred to Vascular surgery (felt they appeared ischemic).  Then in the few days before admission, patient developed fever, malaise, recurrent vomiting, and redness/swelling of his right foot.  Admitted for cellulitis/sepsis and started on vancomycin, aztreonam, and Flagyl.    Vascular surgery were consulted, ordered duplex arterial studies of the right leg which indicated no large vessel occlusion, felt that the ulcers were largely small vessel disease and neuropathy related.  Orthopedics was consulted, they agreed that the wounds were likely small vessel disease and neuropathy related, conservative measures should be tried, followed by DKA if failure.  Infectious disease were consulted.     Assessment & Plan:  Sepsis from diabetic foot infection -Continue ertapenem -At discharge will plan for Levaquin and Flagyl 3-4 weeks and Follow up with Dr. Amalia Hailey in clinic closely   Acute on chronic diastolic CHF New.  EF 65% but grade II DD.  Currently with dyspnea on exertion, scrotal edema. -IV Lasix 40 mg daily -Aggressive potassium replacement -Strict I/os, daily weights -Daily BMP   Diabetes with polyneuropathy HgbA1c >12% back in 2011, but since 2016 have been in 6.5-7.5 range, indicating improved control.  He has however, severe insensate neuropathy.   -Continue Lantus and SSI    Hypertension -Continue hydralazine, metoprolol, clonidine -Additional hydralazine IV as needed -Continue statin      Chronic kidney disease stage III Creatinine slightly worse today -Daily BMP, close monitoring of renal function while diuresing  Gastroparesis Irritable bowel syndrome -Continue dicyclomine, Reglan, PPI, sucralfate  Glaucoma -Continue eyedrops   Anemia of chronic kidney disease Hgb stable.        DVT prophylaxis: Heparin Code Status: FULL Family Communication: None present MDM and disposition Plan: The below labs and imaging reports were reviewed, the patient was admitted for limb threatening infection and now continues on IV antibiotics and is showing improvement. Treatment for his infection can lead to diastolic CHF flare, which will require continued IV Lasix and close renal function monitoring.  Likely home with home health tomorrow or the next day.         Consultants:   ID  Ortho  Vascular  Procedures:   Duplex US of RLE Final Interpretation: Right: No obvious evidence of occlusion or significant stenosis    noted in the visualized arteries of the right lower    extremity.  US abdomen 3/23 IMPRESSION: Normal right upper quadrant ultrasound.    Radiograph right foot 3/22 FINDINGS: No acute fracture is seen. Patient is status post amputation of first digit at the level of the mid first metatarsal. Increased bony proliferative changes about the cut margin of the first metatarsal with indistinct appearance of the cortex on the medial side. Vascular calcifications. No gas in the soft tissues  IMPRESSION: Status post amputation of first digit at the level of the mid first metatarsal. Indistinct appearance of the medial cortex of the shaft of the residual metatarsal is concerning for ongoing osteomyelitis.     Antimicrobials:   Vancomycin3/22 >>  3/27  Aztreonam3/22 >> 3/27  Metronidazole 3/22 >> 3/27  Ertapenem 3/27 >>   Subjective: Feeling okay.  No new pain in the right foot.  No new fever, vomiting, dyspnea.  His swelling  in the scrotum in the legs is persistent.  His SOB with exertion is stable.      Objective: Vitals:   11/05/17 0503 11/05/17 0529 11/05/17 0700 11/05/17 1418  BP: (!) 187/92 (!) 174/93 (!) 172/98 (!) 172/94  Pulse: 75  79 81  Resp: 17   16  Temp: 98.1 F (36.7 C)  98.6 F (37 C) 98.2 F (36.8 C)  TempSrc: Oral  Oral Oral  SpO2: 99%  98% 100%  Weight: (!) 148.3 kg (326 lb 15.1 oz)     Height:        Intake/Output Summary (Last 24 hours) at 11/05/2017 1429 Last data filed at 11/05/2017 1000 Gross per 24 hour  Intake 1650 ml  Output 5550 ml  Net -3900 ml   Filed Weights   10/30/17 2008 11/01/17 1320 11/05/17 0503  Weight: (!) 143.8 kg (317 lb) (!) 147 kg (324 lb 1.2 oz) (!) 148.3 kg (326 lb 15.1 oz)    Examination: General appearance: BMI greater than 40, adult male, sitting up in a chair, interactive. HEENT:   Oral mucosa normal, lips and gums and teeth normal.  External nose normal. Skin: Skin is warm and dry, without jaundice or suspicious rashes or lesions.  Bilateral lower extremities are wrapped, but the toes are normal.  warm and dry.  No jaundice.  No suspicious rashes or lesions. Cardiac: RRR, nl S1-S2, no murmurs appreciated.  Capillary refill is brisk.  JVP not visible.  Pitting LE edema, also scrotal edema.  Radial pulses 2+ and symmetric. Respiratory: Normal respiratory rate and rhythm.  CTAB without rales or wheezes. Abdomen: Soft, no TTP, no ascites tube, distention, HSM. MSK: No deformities or effusions, both lower extremities are in [place] boots. Neuro: Cranial nerves normal.  Moves all extremities.   Psych: Interactive, attention normal, affect normal, judgment and insight, moderate.       Data Reviewed: I have personally reviewed following labs and imaging studies:  CBC: Recent Labs  Lab 10/30/17 2020 10/31/17 0311 11/01/17 0731 11/02/17 0923 11/04/17 0439  WBC 9.3 8.1 5.9 7.0 6.2  NEUTROABS 7.6  --   --   --   --   HGB 9.4* 8.5* 9.4* 9.7* 9.4*    HCT 27.3* 24.8* 27.9* 28.9* 28.5*  MCV 88.6 89.5 91.8 89.8 89.9  PLT 169 156 142* 164 390*   Basic Metabolic Panel: Recent Labs  Lab 10/31/17 0630 11/01/17 0731 11/01/17 1257 11/02/17 0923 11/04/17 0439 11/04/17 1537 11/05/17 0436  NA 138 141  --  141 138  --  142  K 3.0* 2.8* 3.2* 3.4* 2.6* 3.7 2.7*  CL 103 113*  --  112* 108  --  109  CO2 25 22  --  19* 23  --  26  GLUCOSE 87 124*  --  95 87  --  72  BUN 6 8  --  7 5*  --  5*  CREATININE 1.37* 1.36*  --  1.28* 1.26*  --  1.34*  CALCIUM 7.5* 7.6*  --  8.0* 7.7*  --  7.9*  MG  --   --  1.7  --   --   --   --    GFR: Estimated Creatinine Clearance: 102.1 mL/min (A) (by C-G formula based on SCr of  1.34 mg/dL (H)). Liver Function Tests: Recent Labs  Lab 10/30/17 2020 10/31/17 0630  AST 25 20  ALT 10* 7*  ALKPHOS 59 54  BILITOT 0.8 0.8  PROT 6.0* 5.1*  ALBUMIN 2.7* 2.2*   No results for input(s): LIPASE, AMYLASE in the last 168 hours. No results for input(s): AMMONIA in the last 168 hours. Coagulation Profile: Recent Labs  Lab 10/30/17 2020  INR 1.25   Cardiac Enzymes: No results for input(s): CKTOTAL, CKMB, CKMBINDEX, TROPONINI in the last 168 hours. BNP (last 3 results) No results for input(s): PROBNP in the last 8760 hours. HbA1C: No results for input(s): HGBA1C in the last 72 hours. CBG: Recent Labs  Lab 11/04/17 1211 11/04/17 1612 11/04/17 2120 11/05/17 0742 11/05/17 1203  GLUCAP 78 113* 114* 71 135*   Lipid Profile: No results for input(s): CHOL, HDL, LDLCALC, TRIG, CHOLHDL, LDLDIRECT in the last 72 hours. Thyroid Function Tests: No results for input(s): TSH, T4TOTAL, FREET4, T3FREE, THYROIDAB in the last 72 hours. Anemia Panel: No results for input(s): VITAMINB12, FOLATE, FERRITIN, TIBC, IRON, RETICCTPCT in the last 72 hours. Urine analysis:    Component Value Date/Time   COLORURINE YELLOW 10/30/2017 2332   APPEARANCEUR HAZY (A) 10/30/2017 2332   LABSPEC 1.013 10/30/2017 2332   PHURINE  7.0 10/30/2017 2332   GLUCOSEU 150 (A) 10/30/2017 2332   HGBUR SMALL (A) 10/30/2017 2332   BILIRUBINUR NEGATIVE 10/30/2017 2332   KETONESUR 5 (A) 10/30/2017 2332   PROTEINUR >=300 (A) 10/30/2017 2332   UROBILINOGEN 0.2 06/07/2015 1353   NITRITE NEGATIVE 10/30/2017 2332   LEUKOCYTESUR NEGATIVE 10/30/2017 2332   Sepsis Labs: @LABRCNTIP (procalcitonin:4,lacticacidven:4)  ) Recent Results (from the past 240 hour(s))  Culture, blood (Routine x 2)     Status: None   Collection Time: 10/30/17  8:20 PM  Result Value Ref Range Status   Specimen Description   Final    BLOOD RIGHT ARM Performed at [facility] [address], [place], [place] [address]    Special Requests   Final    BOTTLES DRAWN AEROBIC AND ANAEROBIC Blood Culture results may not be optimal due to an excessive volume of blood received in culture bottles Performed at [facility] [address] [place], [place] [address]    Culture   Final    NO GROWTH 5 DAYS Performed at [facility], [place] [address] [place], [place] [address]    Report Status 11/04/2017 FINAL  Final  Culture, blood (Routine x 2)     Status: None   Collection Time: 10/30/17  9:21 PM  Result Value Ref Range Status   Specimen Description   Final    BLOOD LEFT FOREARM Performed at [facility] [address] [place], [place] [address]    Special Requests   Final    BOTTLES DRAWN AEROBIC AND ANAEROBIC Blood Culture adequate volume   Culture   Final    NO GROWTH 5 DAYS Performed at [facility], [place] [address], [place], [place] [address]    Report Status 11/04/2017 FINAL  Final  Urine culture     Status: Abnormal   Collection Time: 10/30/17 11:32 PM  Result Value Ref Range Status   Specimen Description   Final    URINE, CLEAN CATCH Performed at [facility], [place] [address] [place], [place] [address]    Special Requests   Final    Normal Performed at  [facility], [place]  [address], [place], [place] [address]    Culture (A)  Final    50,000 COLONIES/mL DIPHTHEROIDS(CORYNEBACTERIUM SPECIES) Standardized susceptibility testing for this organism is not available. Performed at [facility], [place] [address], [place], [place] [address]    Report Status 11/01/2017 FINAL  Final  MRSA PCR Screening     Status: None   Collection Time: 10/31/17  2:54 AM  Result Value Ref Range Status   MRSA by PCR NEGATIVE NEGATIVE Final    Comment:        The GeneXpert MRSA Assay (FDA approved for NASAL specimens only), is one component of a comprehensive MRSA colonization surveillance program. It is not intended to diagnose MRSA infection nor to guide or monitor treatment for MRSA infections. Performed at [facility], [place] [address], [place], [place] [address]   Urine Culture     Status: None   Collection Time: 11/03/17  1:42 AM  Result Value Ref Range Status   Specimen Description URINE, RANDOM  Final   Special Requests NONE  Final   Culture   Final    NO GROWTH Performed at [facility], [place] [address], [place],  [address]    Report Status 11/04/2017 FINAL  Final         Radiology Studies: No results found.      Scheduled Meds: . atorvastatin  10 mg Oral QPM  . brimonidine  1 drop Both Eyes TID  . cloNIDine  0.1 mg Oral BID  . collagenase   Topical Daily  . dicyclomine  10 mg Oral TID AC & HS  . feeding supplement (PRO-STAT SUGAR FREE 64)  30 mL Oral BID  . folic acid  1 mg Oral Daily  . furosemide  40 mg Intravenous Daily  . heparin  5,000 Units Subcutaneous Q8H  . hydrALAZINE  50 mg Oral Q8H  . insulin aspart  0-15 Units Subcutaneous TID WC  . insulin glargine  15 Units Subcutaneous BID  . metoCLOPramide  10 mg Oral TID AC  . metoprolol succinate  25 mg Oral Daily  . pantoprazole  40 mg Oral BID AC  . potassium chloride  40 mEq Oral BID  . sucralfate  1 g Oral QID  . thiamine   100 mg Oral Daily  . timolol  1 drop Both Eyes BID   Continuous Infusions: . ertapenem 1 g (11/05/17 1316)  . sodium chloride Stopped (10/30/17 2154)     LOS: 6 days    Time spent: 25 minutes    Edwin Dada, MD [facility] 11/05/2017, 10:30 AM     Pager [phone] --- please page though AMION:  [email or web address] If 7PM-7AM, please contact night-coverage

## 2017-11-05 NOTE — Progress Notes (Addendum)
Timothy Clark for Infectious Disease  Date of Admission:  10/30/2017     Total days of antibiotics 7  Day 2 ertapenem     ASSESSMENT: 47 y.o. diabetic male with new diabetic wound to the left heel and mixed ischemic/vascular wounds to the right lateral/medial foot. It appears that copay for IV ertapenem is cost prohibiting and he will not be able to do a multi-dose regimen regarding other IV options. Will start PO Clindamycin for now to see if he can tolerate this. He has tolerated Levaquin in the past but recent EKG with prolonged QTc which has worsened compared to previous studies. Alternatively could try linezolid + flagyl as next option if he does not tolerate clindamycin.   PLAN: 1. Start Clindamycin 300 mg QID PO  2. Follow up next week with me in coordination with orthopedic visit so we can see wounds.    Principal Problem:   Diabetic foot infection ([place]) Active Problems:   Diabetic infection of left heel ([place])   Sepsis ([place])   Gangrene of right foot ([place])   Diabetes mellitus type 2 with complications, uncontrolled ([place])   Essential hypertension   CKD stage 3 due to type 2 diabetes mellitus (HCC)   ACE inhibitor-aggravated angioedema, initial encounter   Right shoulder pain   Restlessness and agitation   Obesity, Class III, BMI 40-49.9 (morbid obesity) ([place])   . atorvastatin  10 mg Oral QPM  . brimonidine  1 drop Both Eyes TID  . clindamycin  300 mg Oral Q6H  . cloNIDine  0.1 mg Oral BID  . collagenase   Topical Daily  . dicyclomine  10 mg Oral TID AC & HS  . feeding supplement (PRO-STAT SUGAR FREE 64)  30 mL Oral BID  . folic acid  1 mg Oral Daily  . furosemide  40 mg Intravenous Daily  . heparin  5,000 Units Subcutaneous Q8H  . hydrALAZINE  50 mg Oral Q8H  . insulin aspart  0-15 Units Subcutaneous TID WC  . insulin glargine  15 Units Subcutaneous BID  . metoCLOPramide  10 mg Oral TID AC  . metoprolol succinate  25 mg Oral Daily  . pantoprazole  40  mg Oral BID AC  . potassium chloride  40 mEq Oral BID  . sucralfate  1 g Oral QID  . thiamine  100 mg Oral Daily  . timolol  1 drop Both Eyes BID    SUBJECTIVE: Still quite swollen and heavy. Condom cath is helpful. Unable to afford $50/day copay for ertapenem and not able to offer much else aside from a once daily infusion. Went over previous regimens and intolerances again with him today.   Allergies  Allergen Reactions  . Donnatal [Pb-Hyoscy-Atropine-Scopolamine] Anaphylaxis    Reaction to GI cocktail  . Fish Allergy Anaphylaxis, Hives and Rash  . Haldol [Haloperidol Lactate] Other (See Comments)    Chest Pain  . Lidocaine Anaphylaxis    Reaction to GI cocktail  . Maalox [Calcium Carbonate Antacid] Anaphylaxis    Reaction to GI cocktail  . Lisinopril Swelling  . Aspirin Hives and Itching  . Bactrim [Sulfamethoxazole-Trimethoprim] Itching  . Doxycycline Hives and Nausea And Vomiting    Severe   . Keflex [Cephalexin] Hives, Itching and Nausea And Vomiting  . Penicillins Hives and Itching    Has patient had a PCN reaction causing immediate rash, facial/tongue/throat swelling, SOB or lightheadedness with hypotension: yes Has patient had a PCN reaction causing  severe rash involving mucus membranes or skin necrosis: yes Has patient had a PCN reaction that required hospitalization no Has patient had a PCN reaction occurring within the last 10 years: yes If all of the above answers are "NO", then may proceed with Cephalospor  . Phenergan [Promethazine Hcl] Nausea And Vomiting    OBJECTIVE: Vitals:   11/05/17 0503 11/05/17 0529 11/05/17 0700 11/05/17 1418  BP: (!) 187/92 (!) 174/93 (!) 172/98 (!) 172/94  Pulse: 75  79 81  Resp: 17   16  Temp: 98.1 F (36.7 C)  98.6 F (37 C) 98.2 F (36.8 C)  TempSrc: Oral  Oral Oral  SpO2: 99%  98% 100%  Weight: (!) 326 lb 15.1 oz (148.3 kg)     Height:       Body mass index is 44.34 kg/m.  Physical Exam  Constitutional: He is  oriented to person, place, and time and well-developed, well-nourished, and in no distress.  Seated in chair and appears comfortable  HENT:  Mouth/Throat: Oropharynx is clear and moist. No oral lesions. Normal dentition. No dental caries.  Eyes: No scleral icterus.  Left eye deviated gaze  Cardiovascular: Normal rate, regular rhythm and normal heart sounds.  No murmur heard. Pulmonary/Chest: Effort normal and breath sounds normal. No respiratory distress.  Abdominal: Soft. He exhibits no distension. There is no tenderness.  Musculoskeletal: He exhibits edema (generalized edema noted).  Bilateral legs wrapped. Capillary refill, temp and skin color normal and unchanged from yesterday.   Lymphadenopathy:    He has no cervical adenopathy.  Neurological: He is alert and oriented to person, place, and time.  Skin: Skin is warm and dry. No rash noted.  Psychiatric: Mood and affect normal.    Lab Results Lab Results  Component Value Date   WBC 6.2 11/04/2017   HGB 9.4 (L) 11/04/2017   HCT 28.5 (L) 11/04/2017   MCV 89.9 11/04/2017   PLT 134 (L) 11/04/2017    Lab Results  Component Value Date   CREATININE 1.34 (H) 11/05/2017   BUN 5 (L) 11/05/2017   NA 142 11/05/2017   K 2.7 (LL) 11/05/2017   CL 109 11/05/2017   CO2 26 11/05/2017    Lab Results  Component Value Date   ALT 7 (L) 10/31/2017   AST 20 10/31/2017   ALKPHOS 54 10/31/2017   BILITOT 0.8 10/31/2017     Microbiology: Recent Results (from the past 240 hour(s))  Culture, blood (Routine x 2)     Status: None   Collection Time: 10/30/17  8:20 PM  Result Value Ref Range Status   Specimen Description   Final    BLOOD RIGHT ARM Performed at [facility] [address], [place], [place] [address]    Special Requests   Final    BOTTLES DRAWN AEROBIC AND ANAEROBIC Blood Culture results may not be optimal due to an excessive volume of blood received in culture bottles Performed at [facility] [address] [place], [place] [address]    Culture   Final    NO GROWTH 5 DAYS Performed at [facility], [place] [address], [place], [place] [address]    Report Status 11/04/2017 FINAL  Final  Culture, blood (Routine x 2)     Status: None   Collection Time: 10/30/17  9:21 PM  Result Value Ref Range Status   Specimen Description   Final    BLOOD LEFT FOREARM Performed at [facility], [address] [place], [place] [address]    Special Requests   Final    BOTTLES DRAWN AEROBIC AND ANAEROBIC Blood Culture adequate volume   Culture   Final    NO GROWTH 5 DAYS Performed at [facility], [place] [address], [place], [place] [address]    Report Status 11/04/2017 FINAL  Final  Urine culture     Status: Abnormal   Collection Time: 10/30/17 11:32 PM  Result Value Ref Range Status   Specimen Description   Final    URINE, CLEAN CATCH Performed at [facility], [place] [address] [place], [place] [address]    Special Requests   Final    Normal Performed at [facility], [place] [address], [place], [place] [address]    Culture (A)  Final    50,000 COLONIES/mL DIPHTHEROIDS(CORYNEBACTERIUM SPECIES) Standardized susceptibility testing for this organism is not available. Performed at [facility], [place] [address], [place], [place] [address]    Report Status 11/01/2017 FINAL  Final  MRSA PCR Screening     Status: None   Collection Time: 10/31/17  2:54 AM  Result Value Ref Range Status   MRSA by PCR NEGATIVE NEGATIVE Final    Comment:        The GeneXpert MRSA Assay (FDA approved for NASAL specimens only), is one component of a comprehensive MRSA colonization surveillance program. It is not intended to diagnose MRSA infection nor to guide or monitor treatment for MRSA infections. Performed at [facility], [place] [address] [place], [place] [address]   Urine Culture     Status: None   Collection  Time: 11/03/17  1:42 AM  Result Value Ref Range Status   Specimen Description URINE, RANDOM  Final   Special Requests NONE  Final   Culture   Final    NO GROWTH Performed at [facility], [place] [address], [place],  [address]    Report Status 11/04/2017 FINAL  Final    [name], MSN, NP-C [place] for Infectious Disease [facility] Cell: [phone] Pager: [phone]  11/05/2017  3:06 PM

## 2017-11-06 ENCOUNTER — Other Ambulatory Visit: Payer: Self-pay

## 2017-11-06 DIAGNOSIS — T464X5A Adverse effect of angiotensin-converting-enzyme inhibitors, initial encounter: Secondary | ICD-10-CM

## 2017-11-06 DIAGNOSIS — N183 Chronic kidney disease, stage 3 (moderate): Secondary | ICD-10-CM

## 2017-11-06 DIAGNOSIS — E118 Type 2 diabetes mellitus with unspecified complications: Secondary | ICD-10-CM

## 2017-11-06 DIAGNOSIS — E1165 Type 2 diabetes mellitus with hyperglycemia: Secondary | ICD-10-CM

## 2017-11-06 DIAGNOSIS — A419 Sepsis, unspecified organism: Principal | ICD-10-CM

## 2017-11-06 DIAGNOSIS — E1122 Type 2 diabetes mellitus with diabetic chronic kidney disease: Secondary | ICD-10-CM

## 2017-11-06 DIAGNOSIS — T783XXA Angioneurotic edema, initial encounter: Secondary | ICD-10-CM

## 2017-11-06 LAB — BASIC METABOLIC PANEL
Anion gap: 8 (ref 5–15)
BUN: 7 mg/dL (ref 6–20)
CO2: 23 mmol/L (ref 22–32)
Calcium: 8.4 mg/dL — ABNORMAL LOW (ref 8.9–10.3)
Chloride: 111 mmol/L (ref 101–111)
Creatinine, Ser: 1.4 mg/dL — ABNORMAL HIGH (ref 0.61–1.24)
GFR calc Af Amer: >60 mL/min (ref >60)
GFR calc non Af Amer: 58 mL/min — ABNORMAL LOW (ref >60)
Glucose, Bld: 83 mg/dL (ref 65–99)
Potassium: 4 mmol/L (ref 3.5–5.1)
Sodium: 142 mmol/L (ref 135–145)

## 2017-11-06 LAB — GLUCOSE, CAPILLARY
Glucose-Capillary: 81 mg/dL (ref 65–99)
Glucose-Capillary: 119 mg/dL — ABNORMAL HIGH (ref 65–99)

## 2017-11-06 MED ORDER — CLONIDINE HCL 0.2 MG PO TABS: 0.2000 mg | ORAL_TABLET | Freq: Two times a day (BID) | ORAL | 6 refills | Status: DC

## 2017-11-06 MED ORDER — FUROSEMIDE 40 MG PO TABS: 40.0000 mg | ORAL_TABLET | Freq: Every day | ORAL | Status: DC

## 2017-11-06 MED ORDER — FUROSEMIDE 40 MG PO TABS: 40.0000 mg | ORAL_TABLET | Freq: Every day | ORAL | 0 refills | Status: DC

## 2017-11-06 MED ORDER — METOPROLOL SUCCINATE ER 100 MG PO TB24: 100.0000 mg | ORAL_TABLET | Freq: Every day | ORAL | 6 refills | Status: DC

## 2017-11-06 MED ORDER — CLONIDINE HCL 0.2 MG PO TABS: 0.2000 mg | ORAL_TABLET | Freq: Two times a day (BID) | ORAL | Status: DC

## 2017-11-06 MED ORDER — METOPROLOL SUCCINATE ER 100 MG PO TB24: 100.0000 mg | ORAL_TABLET | Freq: Every day | ORAL | Status: DC

## 2017-11-06 MED ORDER — CLINDAMYCIN HCL 300 MG PO CAPS: 300.0000 mg | ORAL_CAPSULE | Freq: Four times a day (QID) | ORAL | 0 refills | Status: AC

## 2017-11-06 MED ORDER — HYDRALAZINE HCL 50 MG PO TABS: 50.0000 mg | ORAL_TABLET | Freq: Three times a day (TID) | ORAL | Status: DC

## 2017-11-06 MED ORDER — HYDRALAZINE HCL 50 MG PO TABS: 50.0000 mg | ORAL_TABLET | Freq: Three times a day (TID) | ORAL | 6 refills | Status: DC

## 2017-11-06 MED ORDER — POTASSIUM CHLORIDE CRYS ER 20 MEQ PO TBCR: 40.0000 meq | EXTENDED_RELEASE_TABLET | Freq: Every day | ORAL | 0 refills | Status: DC

## 2017-11-06 NOTE — Progress Notes (Signed)
Orthopedic Tech Progress Note Patient Details:  Timothy Clark Jun 28, 1971 [id number]  Ortho Devices Type of Ortho Device: Postop shoe/boot Ortho Device/Splint Location: applied post op shoe to pt left foot.  Post op shoe was provided because pt foot was too large because of swelling. Pt could no longer fit his personal shoe.  Shoe is provided for ambulation only.   Left foot. Ortho Device/Splint Interventions: Application, Adjustment   Post Interventions Instructions Provided: Adjustment of device, Care of device   Kristopher Oppenheim 11/06/2017, 12:34 PM

## 2017-11-06 NOTE — Progress Notes (Signed)
Occupational Therapy Treatment - Delayed Entry - Pt discharged prior to OT entering treatment update Patient Details Name: Timothy Clark MRN: [id number] DOB: Jun 09, 1971 Today's Date: 11/06/2017    History of present illness Pt is a 47 y/o male admitted secondary to sepsis. Per notes, suspect it is due to R foot infection following amputation. PMH includes DM, HTN, CKD 3, legally blind, R 1st ray partial amputation s/p I and D.    OT comments  Pt progressing towards OT goals this session, improved tolerance for transfers and min guard for peri care post ambulation to bathroom as well as sink level grooming. OT will continue to follow acutely, although per Pt he is supposed to dc today.   Follow Up Recommendations  No OT follow up;Supervision - Intermittent    Equipment Recommendations       Recommendations for Other Services      Precautions / Restrictions Precautions Precautions: Fall;Other (comment) Precaution Comments: Legally blind  Required Braces or Orthoses: Other Brace/Splint Other Brace/Splint: reports he has a walker boot, but did not have it with him Restrictions Weight Bearing Restrictions: No       Mobility Bed Mobility               General bed mobility comments: OOB in recliner when OT entered the room  Transfers Overall transfer level: Needs assistance Equipment used: Rolling walker (2 wheeled) Transfers: Sit to/from Stand Sit to Stand: Min guard         General transfer comment: vc for safe hand placement, better today than previous notes    Balance Overall balance assessment: Needs assistance Sitting-balance support: No upper extremity supported;Feet supported Sitting balance-Leahy Scale: Good     Standing balance support: Bilateral upper extremity supported;During functional activity Standing balance-Leahy Scale: Fair Standing balance comment: reliant on external support from RW                           ADL either  performed or assessed with clinical judgement   ADL Overall ADL's : Needs assistance/impaired     Grooming: Wash/dry hands;Min guard;Standing Grooming Details (indicate cue type and reason): sink level                 Toilet Transfer: Min guard;Ambulation;RW;Grab bars;Comfort height toilet Toilet Transfer Details (indicate cue type and reason): vc for safe hand placement during transfers [place] and Hygiene: Min guard;Sitting/lateral lean Toileting - Clothing Manipulation Details (indicate cue type and reason): warm wash cloth used       General ADL Comments: Pt required assist to navigate environment with RW, unsteady on feet however pain is decreased today and so closer to baseline     Vision       Perception     Praxis      Cognition Arousal/Alertness: Awake/alert Behavior During Therapy: WFL for tasks assessed/performed Overall Cognitive Status: Within Functional Limits for tasks assessed                                          Exercises     Shoulder Instructions       General Comments      Pertinent Vitals/ Pain       Pain Assessment: No/denies pain  Home Living  Prior Functioning/Environment              Frequency  Min 2X/week        Progress Toward Goals  OT Goals(current goals can now be found in the care plan section)  Progress towards OT goals: Progressing toward goals  Acute Rehab OT Goals Patient Stated Goal: to go home  OT Goal Formulation: With patient Time For Goal Achievement: 11/19/17 Potential to Achieve Goals: Good  Plan Discharge plan remains appropriate;Frequency remains appropriate    Co-evaluation                 AM-PAC PT "6 Clicks" Daily Activity     Outcome Measure   Help from another person eating meals?: None Help from another person taking care of personal grooming?: A Little Help from another  person toileting, which includes using toliet, bedpan, or urinal?: A Little Help from another person bathing (including washing, rinsing, drying)?: A Lot Help from another person to put on and taking off regular upper body clothing?: None Help from another person to put on and taking off regular lower body clothing?: A Lot 6 Click Score: 18    End of Session Equipment Utilized During Treatment: Rolling walker;Gait belt  OT Visit Diagnosis: Unsteadiness on feet (R26.81);Other abnormalities of gait and mobility (R26.89)   Activity Tolerance Patient tolerated treatment well   Patient Left in chair   Nurse Communication Mobility status        Time: 9485-4627 OT Time Calculation (min): 22 min  Charges: OT General Charges $OT Visit: 1 Visit OT Treatments $Self Care/Home Management : 8-22 mins  Hulda Humphrey OTR/L [place] 11/06/2017, 4:10 PM

## 2017-11-06 NOTE — Progress Notes (Signed)
[facility] Infusion Coordinator following with ID team for possible home IV ABX.  Notified yesterday pt will now DC home on PO ABX.  Call back from [place] at [facility] in [place] late yesterday that they will accept the referral for [facility] services.  Referral information faxed to [facility]:  Phone: [place]:  [phone]  Will need  [place] RN order, any wound care orders and face to face faxed to provide services.  If patient discharges after hours, please call [phone].   Larry Sierras 11/06/2017, 7:28 AM

## 2017-11-06 NOTE — Progress Notes (Signed)
[place] for Infectious Disease  Date of Admission:  10/30/2017     Total days of antibiotics 8  Day 1 Clindamycin      ASSESSMENT: 47 y.o. diabetic male with new diabetic wound to the left heel and mixed ischemic/vascular wounds to the right lateral/medial foot. History of amputation of the right great toe in January of 2019. Multiple drug allergies and intolerances and cost/resource prohibiting for IV therapy at home. He is tolerating the clindamycin well. Diarrhea precautions were discussed with him and his sister in addition to when to notify ID office.   PLAN: 1. Continue Clindamycin 300 mg QID PO x 14 days.  2. Follow up next week prior to visit with orthopedic (same day)   OK for D/C from ID perspective - our office will call and set up an appointment to coordinate with dressing change.    Principal Problem:   Diabetic foot infection ([place]) Active Problems:   Diabetic infection of left heel ([place])   Sepsis ([place])   Gangrene of right foot ([place])   Diabetes mellitus type 2 with complications, uncontrolled ([place])   Essential hypertension   CKD stage 3 due to type 2 diabetes mellitus (HCC)   ACE inhibitor-aggravated angioedema, initial encounter   Right shoulder pain   Restlessness and agitation   Obesity, Class III, BMI 40-49.9 (morbid obesity) ([place])   . atorvastatin  10 mg Oral QPM  . brimonidine  1 drop Both Eyes TID  . clindamycin  300 mg Oral Q6H  . cloNIDine  0.1 mg Oral BID  . collagenase   Topical Daily  . dicyclomine  10 mg Oral TID AC & HS  . feeding supplement (PRO-STAT SUGAR FREE 64)  30 mL Oral BID  . folic acid  1 mg Oral Daily  . furosemide  40 mg Intravenous Daily  . heparin  5,000 Units Subcutaneous Q8H  . hydrALAZINE  50 mg Oral Q8H  . insulin aspart  0-15 Units Subcutaneous TID WC  . insulin glargine  15 Units Subcutaneous BID  . metoCLOPramide  10 mg Oral TID AC  . metoprolol succinate  25 mg Oral Daily  . pantoprazole  40 mg Oral  BID AC  . potassium chloride  40 mEq Oral BID  . sucralfate  1 g Oral QID  . thiamine  100 mg Oral Daily  . timolol  1 drop Both Eyes BID    SUBJECTIVE: Feeling better and ready to go home. Only complaint was a headache last night that he relates to his blood pressure.   Allergies  Allergen Reactions  . Donnatal [Pb-Hyoscy-Atropine-Scopolamine] Anaphylaxis    Reaction to GI cocktail  . Fish Allergy Anaphylaxis, Hives and Rash  . Haldol [Haloperidol Lactate] Other (See Comments)    Chest Pain  . Lidocaine Anaphylaxis    Reaction to GI cocktail  . Maalox [Calcium Carbonate Antacid] Anaphylaxis    Reaction to GI cocktail  . Lisinopril Swelling  . Aspirin Hives and Itching  . Bactrim [Sulfamethoxazole-Trimethoprim] Itching  . Doxycycline Hives and Nausea And Vomiting    Severe   . Keflex [Cephalexin] Hives, Itching and Nausea And Vomiting  . Penicillins Hives and Itching    Has patient had a PCN reaction causing immediate rash, facial/tongue/throat swelling, SOB or lightheadedness with hypotension: yes Has patient had a PCN reaction causing severe rash involving mucus membranes or skin necrosis: yes Has patient had a PCN reaction that required hospitalization no Has patient  had a PCN reaction occurring within the last 10 years: yes If all of the above answers are "NO", then may proceed with Cephalospor  . Phenergan [Promethazine Hcl] Nausea And Vomiting    OBJECTIVE: Vitals:   11/06/17 0443 11/06/17 0452 11/06/17 0500 11/06/17 0600  BP: (!) 192/102 (!) 192/102  (!) 157/76  Pulse: 73   75  Resp: 16     Temp: 97.9 F (36.6 C)     TempSrc: Oral     SpO2: 95%     Weight:   (!) 326 lb 11.6 oz (148.2 kg)   Height:       Body mass index is 44.31 kg/m.  Physical Exam  Constitutional: He is oriented to person, place, and time and well-developed, well-nourished, and in no distress.  Seated in chair and appears comfortable  HENT:  Mouth/Throat: Oropharynx is clear and moist.  No oral lesions. Normal dentition. No dental caries.  Eyes: No scleral icterus.  Left eye deviated gaze  Cardiovascular: Normal rate, regular rhythm and normal heart sounds.  No murmur heard. Pulmonary/Chest: Effort normal and breath sounds normal. No respiratory distress.  Abdominal: Soft. He exhibits no distension. There is no tenderness.  Musculoskeletal: He exhibits edema (generalized edema noted >> improved).  Bilateral legs wrapped. Capillary refill, temp and skin color normal and unchanged from yesterday.   Lymphadenopathy:    He has no cervical adenopathy.  Neurological: He is alert and oriented to person, place, and time.  Skin: Skin is warm and dry. No rash noted.  Psychiatric: Mood and affect normal.    Lab Results Lab Results  Component Value Date   WBC 6.2 11/04/2017   HGB 9.4 (L) 11/04/2017   HCT 28.5 (L) 11/04/2017   MCV 89.9 11/04/2017   PLT 134 (L) 11/04/2017    Lab Results  Component Value Date   CREATININE 1.40 (H) 11/06/2017   BUN 7 11/06/2017   NA 142 11/06/2017   K 4.0 11/06/2017   CL 111 11/06/2017   CO2 23 11/06/2017    Lab Results  Component Value Date   ALT 7 (L) 10/31/2017   AST 20 10/31/2017   ALKPHOS 54 10/31/2017   BILITOT 0.8 10/31/2017     Microbiology: Recent Results (from the past 240 hour(s))  Culture, blood (Routine x 2)     Status: None   Collection Time: 10/30/17  8:20 PM  Result Value Ref Range Status   Specimen Description   Final    BLOOD RIGHT ARM Performed at [facility] [address], [place], [place] [address]    Special Requests   Final    BOTTLES DRAWN AEROBIC AND ANAEROBIC Blood Culture results may not be optimal due to an excessive volume of blood received in culture bottles Performed at [facility] [address] [place], [place] [address]    Culture   Final    NO GROWTH 5 DAYS Performed at [facility], [place] [address], [place], [place] [address]    Report  Status 11/04/2017 FINAL  Final  Culture, blood (Routine x 2)     Status: None   Collection Time: 10/30/17  9:21 PM  Result Value Ref Range Status   Specimen Description   Final    BLOOD LEFT FOREARM Performed at [facility] [address] [place], [place] [address]    Special Requests   Final    BOTTLES DRAWN AEROBIC AND ANAEROBIC Blood Culture adequate volume  Culture   Final    NO GROWTH 5 DAYS Performed at [facility], [place] [address], [place], [place] [address]    Report Status 11/04/2017 FINAL  Final  Urine culture     Status: Abnormal   Collection Time: 10/30/17 11:32 PM  Result Value Ref Range Status   Specimen Description   Final    URINE, CLEAN CATCH Performed at [facility], [place] [address] [place], [place] [address]    Special Requests   Final    Normal Performed at [facility], [place] [address] [place], [place] [address]    Culture (A)  Final    50,000 COLONIES/mL DIPHTHEROIDS(CORYNEBACTERIUM SPECIES) Standardized susceptibility testing for this organism is not available. Performed at [facility], [place] [address] [place], [place] [address]    Report Status 11/01/2017 FINAL  Final  MRSA PCR Screening     Status: None   Collection Time: 10/31/17  2:54 AM  Result Value Ref Range Status   MRSA by PCR NEGATIVE NEGATIVE Final    Comment:        The GeneXpert MRSA Assay (FDA approved for NASAL specimens only), is one component of a comprehensive MRSA colonization surveillance program. It is not intended to diagnose MRSA infection nor to guide or monitor treatment for MRSA infections. Performed at [facility], [place] [address], [place], [place] [address]   Urine Culture     Status: None   Collection Time: 11/03/17  1:42 AM  Result Value Ref Range Status   Specimen Description URINE, RANDOM  Final   Special Requests NONE  Final   Culture   Final    NO GROWTH Performed at [facility], [place] [address] [place], [place] [address]    Report Status 11/04/2017 FINAL  Final    [name], MSN, NP-C [place] for Infectious Disease [facility] Cell: [phone] Pager: [phone]  11/06/2017  11:04 AM

## 2017-11-06 NOTE — Discharge Summary (Signed)
Physician Discharge Summary  Timothy Clark ZGY:[id number] DOB: [birth year] DOA: 10/30/2017  PCP: Timothy Kaufman, PA-C  Admit date: 10/30/2017 Discharge date: 11/06/2017  Admitted From: Home  Disposition:  Home with home health   Recommendations for Outpatient Follow-up:  1. Follow up with ID in 1 week 2. Home Health please obtain BMP on Monday Apr 1 and continue Lasix if renal function stable, stop and repeat Cr if renal function worsening 3. Monitor K on BMP on Monday 4. Please follow up with Timothy Clark, Podiatry on Weds Apr 3 at 4:15PM 5. Wraps to be changed weekly by [place]: Yes  Equipment/Devices: Rolling walker  Discharge Condition: Good  CODE STATUS: FULL Diet recommendation: Diabetic  Brief/Interim Summary: Timothy Clark is a 47 year old male with DM, HTN, gastroparesis and blindness who presented with fever, foot redness, swelling.  The patient had amputation of the right first ray in January 2019.  He was readmitted in February for flare of chronic recurrent gastroparesis.  At some point after discharge in February, he developed large ulcerations on his right mid foot, and when seen by DPM on 10/12/17, was referred to Vascular surgery. However, before that could happen, the patient developed fever, malaise, recurrent vomiting, and redness/swelling of his right foot for a few days, came to the hospital.  Admitted for cellulitis/sepsis and started on vancomycin, aztreonam, and Flagyl.      Sepsis from diabetic foot infection Vascular surgery were consulted, ordered duplex arterial studies of the right leg which indicated no large vessel occlusion, felt that the ulcers were largely small vessel disease and neuropathy related.  Orthopedics was consulted, they agreed that the wounds were likely small vessel disease and neuropathy related, conservative measures should be tried, followed by DKA if failure.  Infectious disease were consulted.  X-ray showed possibly  bony destruction, but it was not felt that osteomyelitis was clinically definite. -Discharged on oral clindamycin 300 QID for 3 weeks, close ID follow up -Placed in Profore boots bilaterally, with plans for weekly changes by Podiatry clinic   Acute on chronic diastolic CHF Patient given 8L total IV fluids in setting of his infection/sepsis.  Became very puffy, had severe scrotal edema.  8L was diuresed off, patient discharged on 3 days Lasix and close following of renal function by [facility] RN next Monday.  Type 2 diabetes with complication of neuropathy, stage 3 chronic kidney disease Insulin continued.  Hypertension Persistently hypertensive.  Clonidine, hydralazine and metoprolol doses increased.  Started on lasix/Diuretic.  Close monitoring by [facility] RN.  Follow up with PCP for BP in 1 week.  Angioedema Lip swelling noted at discharge, ACE inhibitor stopped.  Lifetime contraindication to ACEis.  Anemia of chronic kidney disease Stable      Discharge Diagnoses:  Principal Problem:   Diabetic foot infection ([place]) Active Problems:   Diabetes mellitus type 2 with complications, uncontrolled ([place])   Diabetic infection of left heel ([place])   Essential hypertension   Sepsis ([place])   CKD stage 3 due to type 2 diabetes mellitus (HCC)   Gangrene of right foot (HCC)   ACE inhibitor-aggravated angioedema, initial encounter   Right shoulder pain   Restlessness and agitation   Obesity, Class III, BMI 40-49.9 (morbid obesity) ([employer])    Discharge Instructions  Discharge Instructions    Diet - low sodium heart healthy   Complete by:  As directed    Discharge instructions   Complete by:  As directed    From Dr. Loleta Books:  You were admitted for leg ulcers, with infection.  We doubt that you have infection in the bone, but we cannot exclude it at this time. For that reason, take clindamycin 300 mg four times daily (every six hours) for three weeks. Follow up with Timothy Clark as she planned for  you.  For your leg wraps: Leave them on. See Timothy Clark on Wednesday Apr 3 at 4:15PM.  He can re-wrap your legs. Home Health can leave the wraps on.  For your swelling: Take furosemide 40 mg by mouth in the morning for the next three days Have home health check your potassium and kidney function on Monday 4/1.   Take potassium 40 meq for the next 5 days then stop  If your swelling continues and your kidney function is okay on Monday, you can restart furosemide/Lasix 40 and potassium 40 daily for another 3 days then check kidney function again   For your blood pressure: Stop lisinopril, do not ever take it again(it caused lip swelling/angioedema) Increase your dose of metoprolol as above to 100 daily Increase your dose of clonidine to 0.2 twice daily Increase your dose of hydralazine (all written out on this sheet) to 50 three times a day   Increase activity slowly   Complete by:  As directed      Allergies as of 11/06/2017      Reactions   Donnatal [pb-hyoscy-atropine-scopolamine] Anaphylaxis   Reaction to GI cocktail   Fish Allergy Anaphylaxis, Hives, Rash   Haldol [haloperidol Lactate] Other (See Comments)   Chest Pain   Lidocaine Anaphylaxis   Reaction to GI cocktail   Maalox [calcium Carbonate Antacid] Anaphylaxis   Reaction to GI cocktail   Lisinopril Swelling   Aspirin Hives, Itching   Bactrim [sulfamethoxazole-trimethoprim] Itching   Doxycycline Hives, Nausea And Vomiting   Severe    Keflex [cephalexin] Hives, Itching, Nausea And Vomiting   Penicillins Hives, Itching   Has patient had a PCN reaction causing immediate rash, facial/tongue/throat swelling, SOB or lightheadedness with hypotension: yes Has patient had a PCN reaction causing severe rash involving mucus membranes or skin necrosis: yes Has patient had a PCN reaction that required hospitalization no Has patient had a PCN reaction occurring within the last 10 years: yes If all of the above answers are "NO",  then may proceed with Cephalospor   Phenergan [promethazine Hcl] Nausea And Vomiting      Medication List    STOP taking these medications   lisinopril 5 MG tablet Commonly known as:  PRINIVIL,ZESTRIL   omeprazole 40 MG capsule Commonly known as:  PRILOSEC   pantoprazole 40 MG tablet Commonly known as:  PROTONIX   sucralfate 1 g tablet Commonly known as:  CARAFATE     TAKE these medications   acetaminophen 325 MG tablet Commonly known as:  TYLENOL Take 2 tablets (650 mg total) by mouth every 6 (six) hours as needed for mild pain (or Fever >/= 101).   atorvastatin 10 MG tablet Commonly known as:  LIPITOR Take 10 mg by mouth every evening.   cetirizine 10 MG tablet Commonly known as:  ZYRTEC Take 10 mg by mouth daily.   clindamycin 300 MG capsule Commonly known as:  CLEOCIN Take 1 capsule (300 mg total) by mouth every 6 (six) hours for 21 days.   cloNIDine 0.2 MG tablet Commonly known as:  CATAPRES Take 1 tablet (0.2 mg total) by mouth 2 (two) times daily. What changed:    medication strength  how much  to take   dicyclomine 10 MG capsule Commonly known as:  BENTYL TAKE 1 CAPSULE BY MOUTH 4 TIMES DAILY BEFORE MEAL(S) AND AT BEDTIME   folic acid 1 MG tablet Commonly known as:  FOLVITE Take 1 tablet (1 mg total) by mouth daily.   furosemide 40 MG tablet Commonly known as:  LASIX Take 1 tablet (40 mg total) by mouth daily. Start taking on:  11/07/2017   hydrALAZINE 50 MG tablet Commonly known as:  APRESOLINE Take 1 tablet (50 mg total) by mouth 3 (three) times daily. What changed:    medication strength  how much to take  when to take this   insulin aspart protamine- aspart (70-30) 100 UNIT/ML injection Commonly known as:  NOVOLOG MIX 70/30 Inject 0.25 mLs (25 Units total) into the skin 2 (two) times daily with a meal. What changed:  how much to take   metoprolol succinate 100 MG 24 hr tablet Commonly known as:  TOPROL-XL Take 1 tablet (100 mg  total) by mouth daily. Take with or immediately following a meal. Start taking on:  11/07/2017 What changed:    medication strength  how much to take  additional instructions   potassium chloride SA 20 MEQ tablet Commonly known as:  K-DUR,KLOR-CON Take 2 tablets (40 mEq total) by mouth daily.   sitaGLIPtin 100 MG tablet Commonly known as:  JANUVIA Take 100 mg by mouth daily.   thiamine 100 MG tablet Take 1 tablet (100 mg total) by mouth daily.   timolol 0.5 % ophthalmic solution Commonly known as:  TIMOPTIC Place 1 drop into both eyes 2 (two) times daily.      Follow-up Information    Newt Minion, MD Follow up in 1 week(s).   Specialty:  Orthopedic Surgery Contact information: [place] [address] [phone]        Edrick Kins, DPM. Go on 11/09/2017.   Specialty:  Podiatry Why:  April 01,2019@4 :15pm Contact information: [address] [place] [phone]        [place] Follow up.   Why:  A representative from [facility] will contact you to arrange start date and time for your therapy. Contact information: [phone]         Allergies  Allergen Reactions  . Donnatal [Pb-Hyoscy-Atropine-Scopolamine] Anaphylaxis    Reaction to GI cocktail  . Fish Allergy Anaphylaxis, Hives and Rash  . Haldol [Haloperidol Lactate] Other (See Comments)    Chest Pain  . Lidocaine Anaphylaxis    Reaction to GI cocktail  . Maalox [Calcium Carbonate Antacid] Anaphylaxis    Reaction to GI cocktail  . Lisinopril Swelling  . Aspirin Hives and Itching  . Bactrim [Sulfamethoxazole-Trimethoprim] Itching  . Doxycycline Hives and Nausea And Vomiting    Severe   . Keflex [Cephalexin] Hives, Itching and Nausea And Vomiting  . Penicillins Hives and Itching    Has patient had a PCN reaction causing immediate rash, facial/tongue/throat swelling, SOB or lightheadedness with hypotension: yes Has patient had a  PCN reaction causing severe rash involving mucus membranes or skin necrosis: yes Has patient had a PCN reaction that required hospitalization no Has patient had a PCN reaction occurring within the last 10 years: yes If all of the above answers are "NO", then may proceed with Cephalospor  . Phenergan [Promethazine Hcl] Nausea And Vomiting    Consultations:  Orthopedics  Vascular surgery  Infectious disease   Procedures/Studies: Dg Chest 2 View  Result Date: 10/30/2017 CLINICAL DATA:  Altered mental status with fever EXAM: CHEST - 2 VIEW COMPARISON:  08/31/2017 FINDINGS: Low lung volumes. No consolidation or effusion. Limited lateral view due to overlying soft tissues. Cardiomediastinal silhouette within normal limits. No pneumothorax. IMPRESSION: Low lung volumes.  No focal pulmonary infiltrate. Electronically Signed   By: Donavan Foil M.D.   On: 10/30/2017 21:00   Dg Shoulder Right Port  Result Date: 10/30/2017 CLINICAL DATA:  Arm pain EXAM: PORTABLE RIGHT SHOULDER COMPARISON:  Chest x-ray 10/30/2017, 08/31/2017 FINDINGS: Limited by positioning. No fracture seen. Slight anterior positioning of the humeral head with respect to glenoid fossa but suspect that this may be related to positioning. Right lung apex clear IMPRESSION: 1. No fracture seen 2. Slight anterior positioning of the humeral head with respect to the glenoid fossa, suspect that this may be related to positioning. Electronically Signed   By: Donavan Foil M.D.   On: 10/30/2017 23:25   Dg Foot 2 Views Right  Result Date: 10/30/2017 CLINICAL DATA:  Chronic wound to the right foot EXAM: RIGHT FOOT - 2 VIEW COMPARISON:  10/12/2017, 08/26/2017, 07/14/2017 FINDINGS: No acute fracture is seen. Patient is status post amputation of first digit at the level of the mid first metatarsal. Increased bony proliferative changes about the cut margin of the first metatarsal with indistinct appearance of the cortex on the medial side. Vascular  calcifications. No gas in the soft tissues IMPRESSION: Status post amputation of first digit at the level of the mid first metatarsal. Indistinct appearance of the medial cortex of the shaft of the residual metatarsal is concerning for ongoing osteomyelitis. Electronically Signed   By: Donavan Foil M.D.   On: 10/30/2017 21:05   Dg Foot Complete Right  Result Date: 10/12/2017 Please see detailed radiograph report in office note.  US Abdomen Limited Ruq  Result Date: 10/31/2017 CLINICAL DATA:  Initial evaluation for acute right upper quadrant pain EXAM: ULTRASOUND ABDOMEN LIMITED RIGHT UPPER QUADRANT COMPARISON:  Prior CT from 09/15/2017. FINDINGS: Gallbladder: No gallstones or wall thickening visualized. No sonographic Murphy sign noted by sonographer. Common bile duct: Diameter: 3.7 mm Liver: No focal lesion identified. Within normal limits in parenchymal echogenicity. Portal vein is patent on color Doppler imaging with normal direction of blood flow towards the liver. IMPRESSION: Normal right upper quadrant ultrasound. Electronically Signed   By: Jeannine Boga M.D.   On: 10/31/2017 04:25  LE DUplex arterial US 3/23 Final Interpretation: Right: No obvious evidence of occlusion or significant stenosis    noted in the visualized arteries of the right lower    extremity.          Subjective: Feels somewhat better.  Scrotum still swollen, arm and leg swelling a little better.  No chest pain, dyspnea.  No lip swelling.  No foot pain, fever.  Discharge Exam: Vitals:   11/06/17 0452 11/06/17 0600  BP: (!) 192/102 (!) 157/76  Pulse:  75  Resp:    Temp:    SpO2:     Vitals:   11/06/17 0443 11/06/17 0452 11/06/17 0500 11/06/17 0600  BP: (!) 192/102 (!) 192/102  (!) 157/76  Pulse: 73   75  Resp: 16     Temp: 97.9 F (36.6 C)     TempSrc: Oral     SpO2: 95%     Weight:   (!) 148.2 kg (326 lb 11.6 oz)   Height:        General: Pt is alert, awake, not in  acute  distress Cardiovascular: RRR, S1/S2 +, no rubs, no gallops Respiratory: CTA bilaterally, no wheezing, no rhonchi Abdominal: Soft, NT, ND, bowel sounds + Extremities: still some scrotal edema, legs wrapped, no cyanosis    The results of significant diagnostics from this hospitalization (including imaging, microbiology, ancillary and laboratory) are listed below for reference.     Microbiology: Recent Results (from the past 240 hour(s))  Culture, blood (Routine x 2)     Status: None   Collection Time: 10/30/17  8:20 PM  Result Value Ref Range Status   Specimen Description   Final    BLOOD RIGHT ARM Performed at [facility], [place] [address], [place], [place] [address]    Special Requests   Final    BOTTLES DRAWN AEROBIC AND ANAEROBIC Blood Culture results may not be optimal due to an excessive volume of blood received in culture bottles Performed at [place] [address] [place], [place] [address]    Culture   Final    NO GROWTH 5 DAYS Performed at [facility], [place] [address], [place], [place] [address]    Report Status 11/04/2017 FINAL  Final  Culture, blood (Routine x 2)     Status: None   Collection Time: 10/30/17  9:21 PM  Result Value Ref Range Status   Specimen Description   Final    BLOOD LEFT FOREARM Performed at [place] [address], [place], [place] [address]    Special Requests   Final    BOTTLES DRAWN AEROBIC AND ANAEROBIC Blood Culture adequate volume   Culture   Final    NO GROWTH 5 DAYS Performed at [facility], [place] [address], [place], [place] [address]    Report Status 11/04/2017 FINAL  Final  Urine culture     Status: Abnormal   Collection Time: 10/30/17 11:32 PM  Result Value Ref Range Status   Specimen Description   Final    URINE, CLEAN CATCH Performed at [facility], [place] [address] [place], [place] [address]    Special Requests   Final     Normal Performed at [facility], [place] [address] [place], [place] [address]    Culture (A)  Final    50,000 COLONIES/mL DIPHTHEROIDS(CORYNEBACTERIUM SPECIES) Standardized susceptibility testing for this organism is not available. Performed at [facility], [place] [address], [place], [place] [address]    Report Status 11/01/2017 FINAL  Final  MRSA PCR Screening     Status: None   Collection Time: 10/31/17  2:54 AM  Result Value Ref Range Status   MRSA by PCR NEGATIVE NEGATIVE Final    Comment:        The GeneXpert MRSA Assay (FDA approved for NASAL specimens only), is one component of a comprehensive MRSA colonization surveillance program. It is not intended to diagnose MRSA infection nor to guide or monitor treatment for MRSA infections. Performed at [facility], [place] [address] [place], [place] [address]   Urine Culture     Status: None   Collection Time: 11/03/17  1:42 AM  Result Value Ref Range Status   Specimen Description URINE, RANDOM  Final   Special Requests NONE  Final   Culture   Final    NO GROWTH Performed at [facility], [place] [address] [place], [place] [address]    Report Status 11/04/2017 FINAL  Final     Labs: BNP (  last 3 results) No results for input(s): BNP in the last 8760 hours. Basic Metabolic Panel: Recent Labs  Lab 11/01/17 0731 11/01/17 1257 11/02/17 0923 11/04/17 0439 11/04/17 1537 11/05/17 0436 11/06/17 0500  NA 141  --  141 138  --  142 142  K 2.8* 3.2* 3.4* 2.6* 3.7 2.7* 4.0  CL 113*  --  112* 108  --  109 111  CO2 22  --  19* 23  --  26 23  GLUCOSE 124*  --  95 87  --  72 83  BUN 8  --  7 5*  --  5* 7  CREATININE 1.36*  --  1.28* 1.26*  --  1.34* 1.40*  CALCIUM 7.6*  --  8.0* 7.7*  --  7.9* 8.4*  MG  --  1.7  --   --   --   --   --    Liver Function Tests: Recent Labs  Lab 10/30/17 2020 10/31/17 0630  AST 25 20  ALT 10* 7*  ALKPHOS 59 54  BILITOT 0.8 0.8  PROT 6.0* 5.1*  ALBUMIN  2.7* 2.2*   No results for input(s): LIPASE, AMYLASE in the last 168 hours. No results for input(s): AMMONIA in the last 168 hours. CBC: Recent Labs  Lab 10/30/17 2020 10/31/17 0311 11/01/17 0731 11/02/17 0923 11/04/17 0439  WBC 9.3 8.1 5.9 7.0 6.2  NEUTROABS 7.6  --   --   --   --   HGB 9.4* 8.5* 9.4* 9.7* 9.4*  HCT 27.3* 24.8* 27.9* 28.9* 28.5*  MCV 88.6 89.5 91.8 89.8 89.9  PLT 169 156 142* 164 134*   Cardiac Enzymes: No results for input(s): CKTOTAL, CKMB, CKMBINDEX, TROPONINI in the last 168 hours. BNP: Invalid input(s): POCBNP CBG: Recent Labs  Lab 11/05/17 1203 11/05/17 1652 11/05/17 2131 11/06/17 0448 11/06/17 0753  GLUCAP 135* 98 90 81 119*   D-Dimer No results for input(s): DDIMER in the last 72 hours. Hgb A1c No results for input(s): HGBA1C in the last 72 hours. Lipid Profile No results for input(s): CHOL, HDL, LDLCALC, TRIG, CHOLHDL, LDLDIRECT in the last 72 hours. Thyroid function studies No results for input(s): TSH, T4TOTAL, T3FREE, THYROIDAB in the last 72 hours.  Invalid input(s): FREET3 Anemia work up No results for input(s): VITAMINB12, FOLATE, FERRITIN, TIBC, IRON, RETICCTPCT in the last 72 hours. Urinalysis    Component Value Date/Time   COLORURINE YELLOW 10/30/2017 2332   APPEARANCEUR HAZY (A) 10/30/2017 2332   LABSPEC 1.013 10/30/2017 2332   PHURINE 7.0 10/30/2017 2332   GLUCOSEU 150 (A) 10/30/2017 2332   HGBUR SMALL (A) 10/30/2017 2332   BILIRUBINUR NEGATIVE 10/30/2017 2332   KETONESUR 5 (A) 10/30/2017 2332   PROTEINUR >=300 (A) 10/30/2017 2332   UROBILINOGEN 0.2 06/07/2015 1353   NITRITE NEGATIVE 10/30/2017 2332   LEUKOCYTESUR NEGATIVE 10/30/2017 2332   Sepsis Labs Invalid input(s): PROCALCITONIN,  WBC,  LACTICIDVEN Microbiology Recent Results (from the past 240 hour(s))  Culture, blood (Routine x 2)     Status: None   Collection Time: 10/30/17  8:20 PM  Result Value Ref Range Status   Specimen Description   Final    BLOOD  RIGHT ARM Performed at [facility], [place] [address] [place], [place] [address]    Special Requests   Final    BOTTLES DRAWN AEROBIC AND ANAEROBIC Blood Culture results may not be optimal due to an excessive volume of blood received in culture bottles Performed at [facility],  [place] [address] [place], [place] [address]    Culture   Final    NO GROWTH 5 DAYS Performed at [facility], [place] [address] [place], [place] [address]    Report Status 11/04/2017 FINAL  Final  Culture, blood (Routine x 2)     Status: None   Collection Time: 10/30/17  9:21 PM  Result Value Ref Range Status   Specimen Description   Final    BLOOD LEFT FOREARM Performed at [place] [address], [place], [place] [address]    Special Requests   Final    BOTTLES DRAWN AEROBIC AND ANAEROBIC Blood Culture adequate volume   Culture   Final    NO GROWTH 5 DAYS Performed at [facility], [place] [address], [place], [place] [address]    Report Status 11/04/2017 FINAL  Final  Urine culture     Status: Abnormal   Collection Time: 10/30/17 11:32 PM  Result Value Ref Range Status   Specimen Description   Final    URINE, CLEAN CATCH Performed at [facility], [place] [address] [place], [place] [address]    Special Requests   Final    Normal Performed at [facility], [place] [address], [place], [place] [address]    Culture (A)  Final    50,000 COLONIES/mL DIPHTHEROIDS(CORYNEBACTERIUM SPECIES) Standardized susceptibility testing for this organism is not available. Performed at [facility], [place] [address] [place], [place] [address]    Report Status 11/01/2017 FINAL  Final  MRSA PCR Screening     Status: None   Collection Time: 10/31/17  2:54 AM  Result Value Ref Range Status   MRSA by PCR NEGATIVE NEGATIVE Final    Comment:        The GeneXpert MRSA Assay (FDA approved for NASAL specimens only), is  one component of a comprehensive MRSA colonization surveillance program. It is not intended to diagnose MRSA infection nor to guide or monitor treatment for MRSA infections. Performed at [facility], [place] [address] [place], [place] [address]   Urine Culture     Status: None   Collection Time: 11/03/17  1:42 AM  Result Value Ref Range Status   Specimen Description URINE, RANDOM  Final   Special Requests NONE  Final   Culture   Final    NO GROWTH Performed at [facility], [place] [address], [place], [place] [address]    Report Status 11/04/2017 FINAL  Final     Time coordinating discharge: 50 minutes  SIGNED:   Edwin Dada, MD  [facility] 11/06/2017, 11:46 AM

## 2017-11-07 ENCOUNTER — Emergency Department (HOSPITAL_COMMUNITY): Payer: Medicare HMO

## 2017-11-07 ENCOUNTER — Emergency Department (HOSPITAL_COMMUNITY)
Admission: EM | Admit: 2017-11-07 | Discharge: 2017-11-08 | Disposition: A | Discharge status: Home / Self Care | Payer: Medicare HMO | Source: Home / Self Care | Attending: Emergency Medicine | Admitting: Emergency Medicine

## 2017-11-07 DIAGNOSIS — E1122 Type 2 diabetes mellitus with diabetic chronic kidney disease: Secondary | ICD-10-CM | POA: Insufficient documentation

## 2017-11-07 DIAGNOSIS — Z6841 Body Mass Index (BMI) 40.0 and over, adult: Secondary | ICD-10-CM | POA: Diagnosis not present

## 2017-11-07 DIAGNOSIS — D638 Anemia in other chronic diseases classified elsewhere: Secondary | ICD-10-CM | POA: Diagnosis present

## 2017-11-07 DIAGNOSIS — R600 Localized edema: Secondary | ICD-10-CM

## 2017-11-07 DIAGNOSIS — H409 Unspecified glaucoma: Secondary | ICD-10-CM | POA: Diagnosis present

## 2017-11-07 DIAGNOSIS — E11628 Type 2 diabetes mellitus with other skin complications: Secondary | ICD-10-CM | POA: Diagnosis not present

## 2017-11-07 DIAGNOSIS — R918 Other nonspecific abnormal finding of lung field: Secondary | ICD-10-CM | POA: Diagnosis not present

## 2017-11-07 DIAGNOSIS — R0602 Shortness of breath: Secondary | ICD-10-CM | POA: Diagnosis not present

## 2017-11-07 DIAGNOSIS — Z79899 Other long term (current) drug therapy: Secondary | ICD-10-CM | POA: Insufficient documentation

## 2017-11-07 DIAGNOSIS — K3184 Gastroparesis: Secondary | ICD-10-CM | POA: Diagnosis not present

## 2017-11-07 DIAGNOSIS — K219 Gastro-esophageal reflux disease without esophagitis: Secondary | ICD-10-CM | POA: Diagnosis not present

## 2017-11-07 DIAGNOSIS — N183 Chronic kidney disease, stage 3 (moderate): Secondary | ICD-10-CM | POA: Diagnosis not present

## 2017-11-07 DIAGNOSIS — E785 Hyperlipidemia, unspecified: Secondary | ICD-10-CM | POA: Diagnosis present

## 2017-11-07 DIAGNOSIS — R601 Generalized edema: Secondary | ICD-10-CM | POA: Diagnosis not present

## 2017-11-07 DIAGNOSIS — R2243 Localized swelling, mass and lump, lower limb, bilateral: Secondary | ICD-10-CM

## 2017-11-07 DIAGNOSIS — Z961 Presence of intraocular lens: Secondary | ICD-10-CM | POA: Diagnosis present

## 2017-11-07 DIAGNOSIS — Z794 Long term (current) use of insulin: Secondary | ICD-10-CM

## 2017-11-07 DIAGNOSIS — H548 Legal blindness, as defined in USA: Secondary | ICD-10-CM | POA: Diagnosis not present

## 2017-11-07 DIAGNOSIS — R109 Unspecified abdominal pain: Secondary | ICD-10-CM | POA: Diagnosis not present

## 2017-11-07 DIAGNOSIS — E876 Hypokalemia: Secondary | ICD-10-CM | POA: Diagnosis present

## 2017-11-07 DIAGNOSIS — R609 Edema, unspecified: Secondary | ICD-10-CM | POA: Diagnosis not present

## 2017-11-07 DIAGNOSIS — R6520 Severe sepsis without septic shock: Secondary | ICD-10-CM | POA: Diagnosis not present

## 2017-11-07 DIAGNOSIS — I10 Essential (primary) hypertension: Secondary | ICD-10-CM | POA: Diagnosis not present

## 2017-11-07 DIAGNOSIS — I129 Hypertensive chronic kidney disease with stage 1 through stage 4 chronic kidney disease, or unspecified chronic kidney disease: Secondary | ICD-10-CM

## 2017-11-07 DIAGNOSIS — E11621 Type 2 diabetes mellitus with foot ulcer: Secondary | ICD-10-CM | POA: Diagnosis not present

## 2017-11-07 DIAGNOSIS — E1143 Type 2 diabetes mellitus with diabetic autonomic (poly)neuropathy: Secondary | ICD-10-CM | POA: Diagnosis not present

## 2017-11-07 DIAGNOSIS — Z888 Allergy status to other drugs, medicaments and biological substances status: Secondary | ICD-10-CM | POA: Diagnosis not present

## 2017-11-07 DIAGNOSIS — Z833 Family history of diabetes mellitus: Secondary | ICD-10-CM | POA: Diagnosis not present

## 2017-11-07 DIAGNOSIS — E1151 Type 2 diabetes mellitus with diabetic peripheral angiopathy without gangrene: Secondary | ICD-10-CM | POA: Diagnosis present

## 2017-11-07 DIAGNOSIS — G8929 Other chronic pain: Secondary | ICD-10-CM | POA: Diagnosis not present

## 2017-11-07 DIAGNOSIS — Z8249 Family history of ischemic heart disease and other diseases of the circulatory system: Secondary | ICD-10-CM | POA: Diagnosis not present

## 2017-11-07 DIAGNOSIS — R52 Pain, unspecified: Secondary | ICD-10-CM

## 2017-11-07 DIAGNOSIS — R1900 Intra-abdominal and pelvic swelling, mass and lump, unspecified site: Secondary | ICD-10-CM | POA: Diagnosis not present

## 2017-11-07 DIAGNOSIS — L03115 Cellulitis of right lower limb: Secondary | ICD-10-CM | POA: Diagnosis not present

## 2017-11-07 DIAGNOSIS — N5089 Other specified disorders of the male genital organs: Secondary | ICD-10-CM | POA: Diagnosis not present

## 2017-11-07 LAB — CBC WITH DIFFERENTIAL/PLATELET
Basophils Absolute: 0 10*3/uL (ref 0.0–0.1)
Basophils Relative: 0 %
Eosinophils Absolute: 0.2 10*3/uL (ref 0.0–0.7)
Eosinophils Relative: 2 %
HCT: 29.4 % — ABNORMAL LOW (ref 39.0–52.0)
Hemoglobin: 9.7 g/dL — ABNORMAL LOW (ref 13.0–17.0)
Lymphocytes Relative: 15 %
Lymphs Abs: 1.3 10*3/uL (ref 0.7–4.0)
MCH: 30 pg (ref 26.0–34.0)
MCHC: 33 g/dL (ref 30.0–36.0)
MCV: 91 fL (ref 78.0–100.0)
Monocytes Absolute: 0.6 10*3/uL (ref 0.1–1.0)
Monocytes Relative: 7 %
Neutro Abs: 6.6 10*3/uL (ref 1.7–7.7)
Neutrophils Relative %: 76 %
Platelets: 175 10*3/uL (ref 150–400)
RBC: 3.23 MIL/uL — ABNORMAL LOW (ref 4.22–5.81)
RDW: 14.2 % (ref 11.5–15.5)
WBC: 8.6 10*3/uL (ref 4.0–10.5)

## 2017-11-07 LAB — COMPREHENSIVE METABOLIC PANEL
ALT: 17 U/L (ref 17–63)
AST: 30 U/L (ref 15–41)
Albumin: 2 g/dL — ABNORMAL LOW (ref 3.5–5.0)
Alkaline Phosphatase: 86 U/L (ref 38–126)
Anion gap: 9 (ref 5–15)
BUN: 5 mg/dL — ABNORMAL LOW (ref 6–20)
CO2: 24 mmol/L (ref 22–32)
Calcium: 8 mg/dL — ABNORMAL LOW (ref 8.9–10.3)
Chloride: 105 mmol/L (ref 101–111)
Creatinine, Ser: 1.31 mg/dL — ABNORMAL HIGH (ref 0.61–1.24)
GFR calc Af Amer: >60 mL/min (ref >60)
GFR calc non Af Amer: >60 mL/min (ref >60)
Glucose, Bld: 148 mg/dL — ABNORMAL HIGH (ref 65–99)
Potassium: 3.1 mmol/L — ABNORMAL LOW (ref 3.5–5.1)
Sodium: 138 mmol/L (ref 135–145)
Total Bilirubin: 0.6 mg/dL (ref 0.3–1.2)
Total Protein: 5.1 g/dL — ABNORMAL LOW (ref 6.5–8.1)

## 2017-11-07 MED ORDER — HYDRALAZINE HCL 25 MG PO TABS
50.0000 mg | ORAL_TABLET | Freq: Three times a day (TID) | ORAL | Status: DC
  Administered 2017-11-07: 50 mg via ORAL
  Filled 2017-11-07: qty 2

## 2017-11-07 MED ORDER — FUROSEMIDE 20 MG PO TABS
60.0000 mg | ORAL_TABLET | Freq: Once | ORAL | Status: AC
  Administered 2017-11-07: 60 mg via ORAL
  Filled 2017-11-07: qty 3

## 2017-11-07 MED ORDER — POTASSIUM CHLORIDE CRYS ER 20 MEQ PO TBCR: 40.0000 meq | EXTENDED_RELEASE_TABLET | Freq: Two times a day (BID) | ORAL | 0 refills | Status: DC

## 2017-11-07 MED ORDER — POTASSIUM CHLORIDE CRYS ER 20 MEQ PO TBCR
60.0000 meq | EXTENDED_RELEASE_TABLET | Freq: Once | ORAL | Status: AC
  Administered 2017-11-07: 60 meq via ORAL
  Filled 2017-11-07: qty 3

## 2017-11-07 MED ORDER — CLONIDINE HCL 0.2 MG PO TABS
0.2000 mg | ORAL_TABLET | Freq: Two times a day (BID) | ORAL | Status: DC
  Administered 2017-11-07: 0.2 mg via ORAL
  Filled 2017-11-07: qty 1

## 2017-11-07 MED ORDER — FUROSEMIDE 10 MG/ML IJ SOLN: 40.0000 mg | Freq: Once | INTRAMUSCULAR | Status: DC

## 2017-11-07 MED ORDER — POTASSIUM CHLORIDE CRYS ER 20 MEQ PO TBCR: 40.0000 meq | EXTENDED_RELEASE_TABLET | Freq: Every day | ORAL | Status: DC

## 2017-11-07 MED ORDER — CLINDAMYCIN HCL 150 MG PO CAPS
300.0000 mg | ORAL_CAPSULE | Freq: Four times a day (QID) | ORAL | Status: DC
  Filled 2017-11-07: qty 2

## 2017-11-07 NOTE — ED Provider Notes (Signed)
[facility] EMERGENCY DEPARTMENT Provider Note   CSN: [id number] Arrival date & time: 11/07/17  1507     History   Chief Complaint Chief Complaint  Patient presents with  . Hypertension    HPI Timothy Clark is a 47 y.o. male with past medical history significant for hypertension, diabetes complicated by neuropathy and nonhealing leg ulcer, CKD stage III, hyperlipidemia, PAD, glaucoma, presenting with concerns of hypertension.  Patient explains that he was discharged from the hospital yesterday with treatment for bilateral lower extremity edema and scrotal edema.  Patient has bandages around his legs that are meant to be removed by podiatry in a week.  He had a home health nurse come and visit him today and she was concerned that his blood pressure was "through the roof".  He had reported that he was not urinating very much and she sent him to the emergency department to be evaluated.  He reports that he took his first Lasix pill this morning and urinated once after that and then again here in the emergency department.  He reports voiding completely.  The plan was for him to start taking Lasix and see how he does over the next couple days, follow-up with podiatry and have home health recheck his renal function and potassium on Monday.  Patient denies any new symptoms he has had some significant swelling when discharged.  He reports taking his morning medications but has not taken anything else today.  He denies any bad headache, chest pain, shortness of breath or any other new symptoms.  HPI  Past Medical History:  Diagnosis Date  . Chronic abdominal pain   . Esophagitis   . Eye hemorrhage   . Gastritis   . Gastroparesis   . GERD (gastroesophageal reflux disease)   . Glaucoma   . Helicobacter pylori (H. pylori) infection 2016   ERADICATION DOCUMENTED VIA STOOL TEST in AUG 2016 at [facility]  . Hiccups   . Hypertension   . Nausea and vomiting    chronic, recurrent    . Type 2 diabetes mellitus with complications (HCC)    diagnosed around age 47    Patient Active Problem List   Diagnosis Date Noted  . Obesity, Class III, BMI 40-49.9 (morbid obesity) ([place]) 11/02/2017  . Sepsis ([place]) 10/30/2017  . CKD stage 3 due to type 2 diabetes mellitus ([place]) 10/30/2017  . Gangrene of right foot ([place]) 10/30/2017  . ACE inhibitor-aggravated angioedema, initial encounter 10/30/2017  . Right shoulder pain 10/30/2017  . Restlessness and agitation 10/30/2017  . Acute kidney injury ([place]) 09/21/2017  . Chronic abdominal pain 09/21/2017  . Suicidal ideation 09/21/2017  . Hypertensive urgency 09/21/2017  . Prolonged QT interval 09/21/2017  . Diabetic foot infection ([place]) 07/15/2017  . Sepsis due to cellulitis ([place]) 07/14/2017  . GERD (gastroesophageal reflux disease) 07/14/2017  . Glaucoma 07/14/2017  . Anemia 07/14/2017  . Intractable nausea and vomiting 06/10/2017  . Pressure injury of skin 02/01/2017  . Psychosis ([place]) 01/31/2017  . Hypokalemia 01/31/2017  . AKI (acute kidney injury) ([place]) 09/07/2016  . Insulin dependent diabetes mellitus ([place]) 09/07/2016  . Orthostatic syncope 09/05/2016  . Syncope and collapse   . 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 Diarrhea 09/27/2015  . Gastroparesis due to DM ([place]) 02/06/2015  . Intractable vomiting 01/15/2015  . Gastritis   . Essential hypertension 12/06/2014  . H. pylori infection 11/27/2014  . Mucosal abnormality of stomach   . Mucosal abnormality of esophagus   . Nausea  vomiting and diarrhea 10/25/2014  . Diabetic infection of left heel ([place]) 02/14/2010  . LIMB PAIN 12/11/2009  . UNSPECIFIED PERIPHERAL VASCULAR DISEASE 10/19/2009  . ERECTILE DYSFUNCTION, ORGANIC 10/19/2009  . NUMBNESS 07/20/2009  . Diabetes mellitus type 2 with complications, uncontrolled ([place]) 03/22/2009  . Hyperlipidemia 03/22/2009  . Depression 03/22/2009    Past Surgical History:  Procedure Laterality Date  . CATARACT EXTRACTION W/PHACO  Left 05/19/2016   Procedure: CATARACT EXTRACTION PHACO AND INTRAOCULAR LENS PLACEMENT LEFT EYE;  Surgeon: Tonny Branch, MD;  Location: [facility];  Service: Ophthalmology;  Laterality: Left;  CDE: 7.30  . CATARACT EXTRACTION W/PHACO Right 06/23/2016   Procedure: CATARACT EXTRACTION PHACO AND INTRAOCULAR LENS PLACEMENT RIGHT EYE CDE=9.87;  Surgeon: Tonny Branch, MD;  Location: [facility];  Service: Ophthalmology;  Laterality: Right;  right  . ESOPHAGOGASTRODUODENOSCOPY  2015   Dr. Britta Mccreedy  . ESOPHAGOGASTRODUODENOSCOPY N/A 10/26/2014   RMR: Distal esophagititis-likely reflux related although an element of pill induced injuruy no exclueded  status post biopsy. Diffusely abnormal gastric mucosa of uncertain signigicane -status post gastric biopsy. Focal area of excoriation in the cardia most consistant with a trauma of heaving.   . ESOPHAGOGASTRODUODENOSCOPY  08/2015   [facility]: mild esophagitis and old gastric contents  . EYE SURGERY  2015   stent placed to left eye  . EYE SURGERY    . INCISION AND DRAINAGE OF WOUND Right 07/16/2017   Procedure: IRRIGATION AND DEBRIDEMENT OF SOFT TISSUE OF ULCERATION RIGHT FOOT;  Surgeon: Caprice Beaver, DPM;  Location: [facility];  Service: Podiatry;  Laterality: Right;        Home Medications    Prior to Admission medications   Medication Sig Start Date End Date Taking? Authorizing Provider  acetaminophen (TYLENOL) 325 MG tablet Take 2 tablets (650 mg total) by mouth every 6 (six) hours as needed for mild pain (or Fever >/= 101). 09/24/17  Yes Georgette Shell, MD  atorvastatin (LIPITOR) 10 MG tablet Take 10 mg by mouth every evening.    Yes [provider]  cetirizine (ZYRTEC) 10 MG tablet Take 10 mg by mouth at bedtime.    Yes [provider]  clindamycin (CLEOCIN) 300 MG capsule Take 1 capsule (300 mg total) by mouth every 6 (six) hours for 21 days. 11/06/17 11/27/17 Yes Danford, Suann Larry, MD  cloNIDine (CATAPRES) 0.2 MG tablet Take 1 tablet (0.2  mg total) by mouth 2 (two) times daily. 11/06/17  Yes Danford, Suann Larry, MD  dicyclomine (BENTYL) 10 MG capsule TAKE 1 CAPSULE BY MOUTH 4 TIMES DAILY BEFORE MEAL(S) AND AT BEDTIME Patient taking differently: TAKE 10 mg  CAPSULE BY MOUTH 4 TIMES DAILY BEFORE MEAL(S) AND AT BEDTIME 08/06/17  Yes Carlis Stable, NP  folic acid (FOLVITE) 1 MG tablet Take 1 tablet (1 mg total) by mouth daily. 09/24/17  Yes Georgette Shell, MD  furosemide (LASIX) 40 MG tablet Take 1 tablet (40 mg total) by mouth daily. 11/07/17  Yes Danford, Suann Larry, MD  hydrALAZINE (APRESOLINE) 50 MG tablet Take 1 tablet (50 mg total) by mouth 3 (three) times daily. 11/06/17  Yes Danford, Suann Larry, MD  insulin aspart protamine- aspart (NOVOLOG MIX 70/30) (70-30) 100 UNIT/ML injection Inject 0.25 mLs (25 Units total) into the skin 2 (two) times daily with a meal. Patient taking differently: Inject 30 Units into the skin 2 (two) times daily with a meal.  09/24/17  Yes Georgette Shell, MD  metoprolol succinate (TOPROL-XL) 100 MG 24 hr tablet Take  1 tablet (100 mg total) by mouth daily. Take with or immediately following a meal. 11/07/17  Yes Danford, Suann Larry, MD  sitaGLIPtin (JANUVIA) 100 MG tablet Take 100 mg by mouth daily.   Yes [provider]  thiamine 100 MG tablet Take 1 tablet (100 mg total) by mouth daily. 09/24/17  Yes Georgette Shell, MD  timolol (TIMOPTIC) 0.5 % ophthalmic solution Place 1 drop into both eyes 2 (two) times daily.   Yes [provider]  potassium chloride SA (K-DUR,KLOR-CON) 20 MEQ tablet Take 2 tablets (40 mEq total) by mouth 2 (two) times daily. 11/07/17   Emeline General, PA-C    Family History Family History  Problem Relation Age of Onset  . Ovarian cancer Mother   . Heart attack Father   . Cervical cancer Sister   . Diabetes Sister   . Colon cancer Neg Hx     Social History Social History   Tobacco Use  . Smoking status: Never Smoker  . Smokeless  tobacco: Never Used  Substance Use Topics  . Alcohol use: No    Alcohol/week: 0.0 oz  . Drug use: No     Allergies   Donnatal [pb-hyoscy-atropine-scopolamine]; Fish allergy; Haldol [haloperidol lactate]; Lidocaine; Maalox [calcium carbonate antacid]; Lisinopril; Aspirin; Bactrim [sulfamethoxazole-trimethoprim]; Doxycycline; Keflex [cephalexin]; Penicillins; and Phenergan [promethazine hcl]   Review of Systems Review of Systems  Constitutional: Negative for chills and fever.  Respiratory: Negative for cough, choking, chest tightness, shortness of breath, wheezing and stridor.   Cardiovascular: Positive for leg swelling. Negative for chest pain and palpitations.  Gastrointestinal: Negative for abdominal distention, abdominal pain, diarrhea, nausea and vomiting.  Genitourinary: Positive for scrotal swelling and testicular pain. Negative for difficulty urinating, dysuria, flank pain and hematuria.  Musculoskeletal: Negative for neck pain and neck stiffness.  Skin: Positive for wound. Negative for color change, pallor and rash.  Neurological: Negative for dizziness, tremors, seizures, syncope, facial asymmetry, speech difficulty, weakness, light-headedness, numbness and headaches.     Physical Exam Updated Vital Signs BP (!) 169/101   Pulse 76   Temp 98.9 F (37.2 C) (Oral)   Resp 18   SpO2 96%   Physical Exam  Constitutional: He appears well-developed and well-nourished. No distress.  Afebrile, nontoxic-appearing, lying comfortably in bed no acute distress.  HENT:  Head: Normocephalic and atraumatic.  Eyes: Conjunctivae are normal. Right eye exhibits no discharge. Left eye exhibits no discharge. No scleral icterus.  Cardiovascular: Normal rate, regular rhythm, normal heart sounds and intact distal pulses.  No murmur heard. Pulmonary/Chest: Effort normal and breath sounds normal. No stridor. No respiratory distress. He has no wheezes. He has no rales.  Abdominal: Soft. He  exhibits no distension and no mass. There is no tenderness. There is no rebound and no guarding.  Genitourinary:  Genitourinary Comments: Significant scrotal swelling and tenderness  Musculoskeletal: Normal range of motion. He exhibits edema. He exhibits no deformity.  Bilateral lower extremity edema moving the entire legs and scrotum.  Neurological: He is alert.  Skin: Skin is warm and dry. No rash noted. He is not diaphoretic. No pallor.  Psychiatric: He has a normal mood and affect.  Nursing note and vitals reviewed.    ED Treatments / Results  Labs (all labs ordered are listed, but only abnormal results are displayed) Labs Reviewed  CBC WITH DIFFERENTIAL/PLATELET - Abnormal; Notable for the following components:      Result Value   RBC 3.23 (*)    Hemoglobin 9.7 (*)  HCT 29.4 (*)    All other components within normal limits  COMPREHENSIVE METABOLIC PANEL - Abnormal; Notable for the following components:   Potassium 3.1 (*)    Glucose, Bld 148 (*)    BUN 5 (*)    Creatinine, Ser 1.31 (*)    Calcium 8.0 (*)    Total Protein 5.1 (*)    Albumin 2.0 (*)    All other components within normal limits    EKG EKG Interpretation  Date/Time:  Saturday November 07 2017 15:47:00 EDT Ventricular Rate:  73 PR Interval:  164 QRS Duration: 72 QT Interval:  426 QTC Calculation: 469 R Axis:   65 Text Interpretation:  Normal sinus rhythm Nonspecific T wave abnormality Prolonged QT Abnormal ECG No significant change since last tracing Confirmed by Duffy Bruce [phone]) on 11/07/2017 9:32:54 PM   Radiology Dg Chest 2 View  Result Date: 11/07/2017 CLINICAL DATA:  Hypertension. EXAM: CHEST - 2 VIEW COMPARISON:  Radiographs of October 30, 2017. FINDINGS: The heart size and mediastinal contours are within normal limits. Both lungs are clear. Hypoinflation of the lungs are noted. No pneumothorax or pleural effusion is noted. The visualized skeletal structures are unremarkable. IMPRESSION: No  active cardiopulmonary disease.  Hypoinflation of the lungs. Electronically Signed   By: Marijo Conception, M.D.   On: 11/07/2017 16:21    Procedures Procedures (including critical care time)  Medications Ordered in ED Medications  hydrALAZINE (APRESOLINE) tablet 50 mg (50 mg Oral Given 11/07/17 2309)  cloNIDine (CATAPRES) tablet 0.2 mg (0.2 mg Oral Given 11/07/17 2309)  clindamycin (CLEOCIN) capsule 300 mg (has no administration in time range)  potassium chloride SA (K-DUR,KLOR-CON) CR tablet 60 mEq (60 mEq Oral Given 11/07/17 2308)  furosemide (LASIX) tablet 60 mg (60 mg Oral Given 11/07/17 2321)     Initial Impression / Assessment and Plan / ED Course  I have reviewed the triage vital signs and the nursing notes.  Pertinent labs & imaging results that were available during my care of the patient were reviewed by me and considered in my medical decision making (see chart for details).    Patient is sent to the emergency department for evaluation of elevated blood pressure from home health nurse.  She denies any new symptoms he was just discharged yesterday with significant lower extremity and scrotal edema after fluid resuscitation.   Patient was to start Lasix today and wait a couple days to see improvement.  He took his dose this morning and urinated twice.  He reports urinating all through the night.  Patient was able to urinate without difficulties in the emergency department.  He denies any headache, nausea, vomiting, chest pain, shortness of breath.  The only new finding was that his blood pressure was elevated and he reported that he was not urinating very much today.  Will give patient 1 dose of Lasix with potassium and ordered his nighttime medications.  Patient is asymptomatic from hypertension and blood pressure is 166/93 and he does not have any new symptoms.  Plan to discharge home with potassium, Lasix and changes in his antihypertensives as previously prescribed from discharge  plan.  Patient was discussed with Dr. Ellender Hose who has also seen patient and agrees with assessment and plan. Discharge home with increased potassium intake to 40 BID.  Discussed strict return precautions and advised to return to the emergency department if experiencing any new or worsening symptoms. Instructions were understood and patient agreed with discharge plan.  Final Clinical Impressions(s) /  ED Diagnoses   Final diagnoses:  Hypertension, unspecified type  Lower extremity edema    ED Discharge Orders        Ordered    potassium chloride SA (K-DUR,KLOR-CON) 20 MEQ tablet  2 times daily     11/07/17 2150       Dossie Der 11/08/17 Adelfa Koh    Duffy Bruce, MD 11/08/17 1130

## 2017-11-07 NOTE — ED Triage Notes (Signed)
Pt to ER for hypertension per patient home care giver took blood pressure today and it was "through the roof." also reports generalized swelling. Scrotal edema present and bilateral lower extremity edema. Pt is in NAD.

## 2017-11-08 NOTE — Discharge Instructions (Signed)
As discussed, increase your potassium to 40 mg twice a day and continue taking your Lasix.  Make sure to follow-up with your primary care provider and have your labs drawn as scheduled on Monday.  Return if symptoms worsen, chest pain, shortness of breath, fever, chills or other new concerning symptoms in the meantime.

## 2017-11-08 NOTE — ED Notes (Signed)
Patient Alert and oriented to baseline. Stable and ambulatory to baseline. Patient verbalized understanding of the discharge instructions.  Patient belongings were taken by the patient.   

## 2017-11-09 ENCOUNTER — Other Ambulatory Visit: Payer: Self-pay

## 2017-11-09 ENCOUNTER — Emergency Department (HOSPITAL_COMMUNITY): Payer: Medicare HMO

## 2017-11-09 ENCOUNTER — Encounter (HOSPITAL_COMMUNITY): Payer: Self-pay

## 2017-11-09 ENCOUNTER — Telehealth: Payer: Self-pay | Admitting: *Deleted

## 2017-11-09 ENCOUNTER — Ambulatory Visit: Payer: Medicare HMO | Admitting: Podiatry

## 2017-11-09 ENCOUNTER — Inpatient Hospital Stay (HOSPITAL_COMMUNITY)
Admission: EM | Admit: 2017-11-09 | Discharge: 2017-11-12 | DRG: 948 | Disposition: A | Discharge status: Home Health | Payer: Medicare HMO | Attending: Internal Medicine | Admitting: Internal Medicine

## 2017-11-09 DIAGNOSIS — I129 Hypertensive chronic kidney disease with stage 1 through stage 4 chronic kidney disease, or unspecified chronic kidney disease: Secondary | ICD-10-CM | POA: Diagnosis present

## 2017-11-09 DIAGNOSIS — K3184 Gastroparesis: Secondary | ICD-10-CM | POA: Diagnosis present

## 2017-11-09 DIAGNOSIS — I10 Essential (primary) hypertension: Secondary | ICD-10-CM | POA: Diagnosis present

## 2017-11-09 DIAGNOSIS — R609 Edema, unspecified: Secondary | ICD-10-CM | POA: Diagnosis not present

## 2017-11-09 DIAGNOSIS — Z886 Allergy status to analgesic agent status: Secondary | ICD-10-CM

## 2017-11-09 DIAGNOSIS — E785 Hyperlipidemia, unspecified: Secondary | ICD-10-CM | POA: Diagnosis present

## 2017-11-09 DIAGNOSIS — K219 Gastro-esophageal reflux disease without esophagitis: Secondary | ICD-10-CM | POA: Diagnosis present

## 2017-11-09 DIAGNOSIS — Z91013 Allergy to seafood: Secondary | ICD-10-CM

## 2017-11-09 DIAGNOSIS — E1151 Type 2 diabetes mellitus with diabetic peripheral angiopathy without gangrene: Secondary | ICD-10-CM | POA: Diagnosis present

## 2017-11-09 DIAGNOSIS — G8929 Other chronic pain: Secondary | ICD-10-CM | POA: Diagnosis present

## 2017-11-09 DIAGNOSIS — R600 Localized edema: Secondary | ICD-10-CM | POA: Insufficient documentation

## 2017-11-09 DIAGNOSIS — L03115 Cellulitis of right lower limb: Secondary | ICD-10-CM | POA: Diagnosis present

## 2017-11-09 DIAGNOSIS — Z881 Allergy status to other antibiotic agents status: Secondary | ICD-10-CM

## 2017-11-09 DIAGNOSIS — H409 Unspecified glaucoma: Secondary | ICD-10-CM | POA: Diagnosis present

## 2017-11-09 DIAGNOSIS — Z880 Allergy status to penicillin: Secondary | ICD-10-CM

## 2017-11-09 DIAGNOSIS — R109 Unspecified abdominal pain: Secondary | ICD-10-CM

## 2017-11-09 DIAGNOSIS — Z79899 Other long term (current) drug therapy: Secondary | ICD-10-CM

## 2017-11-09 DIAGNOSIS — N183 Chronic kidney disease, stage 3 unspecified: Secondary | ICD-10-CM | POA: Diagnosis present

## 2017-11-09 DIAGNOSIS — Z888 Allergy status to other drugs, medicaments and biological substances status: Secondary | ICD-10-CM

## 2017-11-09 DIAGNOSIS — R0602 Shortness of breath: Secondary | ICD-10-CM

## 2017-11-09 DIAGNOSIS — E876 Hypokalemia: Secondary | ICD-10-CM | POA: Diagnosis present

## 2017-11-09 DIAGNOSIS — E1143 Type 2 diabetes mellitus with diabetic autonomic (poly)neuropathy: Secondary | ICD-10-CM | POA: Diagnosis present

## 2017-11-09 DIAGNOSIS — T501X5A Adverse effect of loop [high-ceiling] diuretics, initial encounter: Secondary | ICD-10-CM | POA: Diagnosis not present

## 2017-11-09 DIAGNOSIS — N5089 Other specified disorders of the male genital organs: Secondary | ICD-10-CM | POA: Diagnosis not present

## 2017-11-09 DIAGNOSIS — E11628 Type 2 diabetes mellitus with other skin complications: Secondary | ICD-10-CM | POA: Diagnosis present

## 2017-11-09 DIAGNOSIS — E1122 Type 2 diabetes mellitus with diabetic chronic kidney disease: Secondary | ICD-10-CM | POA: Diagnosis present

## 2017-11-09 DIAGNOSIS — Z833 Family history of diabetes mellitus: Secondary | ICD-10-CM

## 2017-11-09 DIAGNOSIS — Z961 Presence of intraocular lens: Secondary | ICD-10-CM | POA: Diagnosis present

## 2017-11-09 DIAGNOSIS — Z6841 Body Mass Index (BMI) 40.0 and over, adult: Secondary | ICD-10-CM

## 2017-11-09 DIAGNOSIS — Z794 Long term (current) use of insulin: Secondary | ICD-10-CM

## 2017-11-09 DIAGNOSIS — D638 Anemia in other chronic diseases classified elsewhere: Secondary | ICD-10-CM | POA: Diagnosis present

## 2017-11-09 DIAGNOSIS — Z883 Allergy status to other anti-infective agents status: Secondary | ICD-10-CM

## 2017-11-09 DIAGNOSIS — Z8249 Family history of ischemic heart disease and other diseases of the circulatory system: Secondary | ICD-10-CM

## 2017-11-09 DIAGNOSIS — R601 Generalized edema: Principal | ICD-10-CM | POA: Diagnosis present

## 2017-11-09 LAB — CBC
HCT: 27.6 % — ABNORMAL LOW (ref 39.0–52.0)
Hemoglobin: 9.1 g/dL — ABNORMAL LOW (ref 13.0–17.0)
MCH: 29.9 pg (ref 26.0–34.0)
MCHC: 33 g/dL (ref 30.0–36.0)
MCV: 90.8 fL (ref 78.0–100.0)
Platelets: 181 10*3/uL (ref 150–400)
RBC: 3.04 MIL/uL — ABNORMAL LOW (ref 4.22–5.81)
RDW: 14.5 % (ref 11.5–15.5)
WBC: 8 10*3/uL (ref 4.0–10.5)

## 2017-11-09 LAB — BRAIN NATRIURETIC PEPTIDE: B Natriuretic Peptide: 354.5 pg/mL — ABNORMAL HIGH (ref 0.0–100.0)

## 2017-11-09 LAB — BASIC METABOLIC PANEL
Anion gap: 8 (ref 5–15)
BUN: 8 mg/dL (ref 6–20)
CO2: 24 mmol/L (ref 22–32)
Calcium: 8 mg/dL — ABNORMAL LOW (ref 8.9–10.3)
Chloride: 106 mmol/L (ref 101–111)
Creatinine, Ser: 1.54 mg/dL — ABNORMAL HIGH (ref 0.61–1.24)
GFR calc Af Amer: >60 mL/min (ref >60)
GFR calc non Af Amer: 52 mL/min — ABNORMAL LOW (ref >60)
Glucose, Bld: 175 mg/dL — ABNORMAL HIGH (ref 65–99)
Potassium: 3.4 mmol/L — ABNORMAL LOW (ref 3.5–5.1)
Sodium: 138 mmol/L (ref 135–145)

## 2017-11-09 LAB — I-STAT TROPONIN, ED
Troponin i, poc: 0 ng/mL (ref 0.00–0.08)
Troponin i, poc: 0.01 ng/mL (ref 0.00–0.08)

## 2017-11-09 MED ORDER — FUROSEMIDE 10 MG/ML IJ SOLN
40.0000 mg | Freq: Two times a day (BID) | INTRAMUSCULAR | Status: DC
  Administered 2017-11-10 – 2017-11-12 (×6): 40 mg via INTRAVENOUS
  Filled 2017-11-09 (×6): qty 4

## 2017-11-09 MED ORDER — HYDRALAZINE HCL 50 MG PO TABS
50.0000 mg | ORAL_TABLET | Freq: Three times a day (TID) | ORAL | Status: DC
  Administered 2017-11-10 – 2017-11-12 (×8): 50 mg via ORAL
  Filled 2017-11-09 (×4): qty 1
  Filled 2017-11-09: qty 2
  Filled 2017-11-09 (×2): qty 1

## 2017-11-09 MED ORDER — SODIUM CHLORIDE 0.9% FLUSH: 3.0000 mL | INTRAVENOUS | Status: DC | PRN

## 2017-11-09 MED ORDER — SODIUM CHLORIDE 0.9 % IV SOLN: 250.0000 mL | INTRAVENOUS | Status: DC | PRN

## 2017-11-09 MED ORDER — FUROSEMIDE 10 MG/ML IJ SOLN
40.0000 mg | Freq: Once | INTRAMUSCULAR | Status: AC
  Administered 2017-11-09: 40 mg via INTRAVENOUS
  Filled 2017-11-09: qty 4

## 2017-11-09 MED ORDER — ACETAMINOPHEN 325 MG PO TABS
650.0000 mg | ORAL_TABLET | Freq: Four times a day (QID) | ORAL | Status: DC | PRN
  Administered 2017-11-10 – 2017-11-12 (×4): 650 mg via ORAL
  Filled 2017-11-09 (×4): qty 2

## 2017-11-09 MED ORDER — FUROSEMIDE 8 MG/ML PO SOLN: 40.0000 mg | Freq: Once | ORAL | Status: DC

## 2017-11-09 MED ORDER — CLONIDINE HCL 0.2 MG PO TABS
0.2000 mg | ORAL_TABLET | Freq: Two times a day (BID) | ORAL | Status: DC
  Administered 2017-11-10 – 2017-11-12 (×6): 0.2 mg via ORAL
  Filled 2017-11-09 (×6): qty 1

## 2017-11-09 MED ORDER — METOPROLOL SUCCINATE ER 100 MG PO TB24
100.0000 mg | ORAL_TABLET | Freq: Every day | ORAL | Status: DC
  Administered 2017-11-10 – 2017-11-12 (×3): 100 mg via ORAL
  Filled 2017-11-09 (×3): qty 1

## 2017-11-09 MED ORDER — CLINDAMYCIN HCL 300 MG PO CAPS
300.0000 mg | ORAL_CAPSULE | Freq: Four times a day (QID) | ORAL | Status: DC
  Administered 2017-11-10 – 2017-11-12 (×10): 300 mg via ORAL
  Filled 2017-11-09 (×8): qty 1
  Filled 2017-11-09: qty 2
  Filled 2017-11-09 (×4): qty 1

## 2017-11-09 MED ORDER — POTASSIUM CHLORIDE CRYS ER 20 MEQ PO TBCR
40.0000 meq | EXTENDED_RELEASE_TABLET | Freq: Two times a day (BID) | ORAL | Status: DC
  Administered 2017-11-10 – 2017-11-12 (×6): 40 meq via ORAL
  Filled 2017-11-09 (×6): qty 2

## 2017-11-09 MED ORDER — BRIMONIDINE TARTRATE 0.15 % OP SOLN
1.0000 [drp] | Freq: Three times a day (TID) | OPHTHALMIC | Status: DC
  Administered 2017-11-10 – 2017-11-12 (×8): 1 [drp] via OPHTHALMIC
  Filled 2017-11-09: qty 5

## 2017-11-09 MED ORDER — ATORVASTATIN CALCIUM 10 MG PO TABS
10.0000 mg | ORAL_TABLET | Freq: Every day | ORAL | Status: DC
  Administered 2017-11-10 – 2017-11-11 (×3): 10 mg via ORAL
  Filled 2017-11-09 (×4): qty 1

## 2017-11-09 MED ORDER — TIMOLOL MALEATE 0.5 % OP SOLN
1.0000 [drp] | Freq: Two times a day (BID) | OPHTHALMIC | Status: DC
  Administered 2017-11-10 – 2017-11-12 (×5): 1 [drp] via OPHTHALMIC
  Filled 2017-11-09: qty 5

## 2017-11-09 MED ORDER — SODIUM CHLORIDE 0.9% FLUSH
3.0000 mL | Freq: Two times a day (BID) | INTRAVENOUS | Status: DC
  Administered 2017-11-10 – 2017-11-12 (×6): 3 mL via INTRAVENOUS

## 2017-11-09 MED ORDER — FOLIC ACID 1 MG PO TABS
1.0000 mg | ORAL_TABLET | Freq: Every day | ORAL | Status: DC
  Administered 2017-11-10 – 2017-11-12 (×3): 1 mg via ORAL
  Filled 2017-11-09 (×3): qty 1

## 2017-11-09 MED ORDER — VITAMIN B-1 100 MG PO TABS
100.0000 mg | ORAL_TABLET | Freq: Every day | ORAL | Status: DC
  Administered 2017-11-10 – 2017-11-12 (×3): 100 mg via ORAL
  Filled 2017-11-09 (×3): qty 1

## 2017-11-09 MED ORDER — INSULIN ASPART PROT & ASPART (70-30 MIX) 100 UNIT/ML ~~LOC~~ SUSP
25.0000 [IU] | Freq: Two times a day (BID) | SUBCUTANEOUS | Status: DC
  Administered 2017-11-10 – 2017-11-12 (×5): 25 [IU] via SUBCUTANEOUS
  Filled 2017-11-09: qty 10

## 2017-11-09 MED ORDER — ACETAMINOPHEN 650 MG RE SUPP: 650.0000 mg | Freq: Four times a day (QID) | RECTAL | Status: DC | PRN

## 2017-11-09 MED ORDER — LORAZEPAM 1 MG PO TABS
1.0000 mg | ORAL_TABLET | Freq: Once | ORAL | Status: AC
Start: 2017-11-09 — End: 2017-11-10
  Administered 2017-11-10: 1 mg via ORAL
  Filled 2017-11-09: qty 1

## 2017-11-09 NOTE — ED Notes (Signed)
IV team attempting to get IV started

## 2017-11-09 NOTE — Telephone Encounter (Signed)
Pt's sister, Tammi Klippel states they have pt at the ER because of the swelling in his legs and they say the wraps have to come off. I told Tammi Klippel, if the ER staff felt the removal of the wraps would be beneficial then they would remove them they did not need an order from our office, but if any questions they could call our office.

## 2017-11-09 NOTE — ED Provider Notes (Signed)
[facility] Provider Note   CSN: [id number] Arrival date & time: 11/09/17  1254     History   Chief Complaint Chief Complaint  Patient presents with  . Leg Swelling  . Shortness of Breath    HPI Timothy Clark is a 47 y.o. male.  The history is provided by the patient. No language interpreter was used.  Shortness of Breath  This is a new problem. The average episode lasts 1 week. The problem occurs continuously.The problem has been gradually worsening. Pertinent negatives include no fever. It is unknown what precipitated the problem. The treatment provided no relief. He has had prior hospitalizations. He has had prior ED visits.  Pt was admitted on 3/22 for sepsis.  Pt was given 8 liters of Iv fluids.  Pt has had increased swelling in legs and scrotum since.  Pt was seen here on 3/30 due to scrotal swelling.  Pt has been taking lasix at home but he has had increased shortness of breath and increased swelling in scrotum.  Pt is in una boots for ulcers and leg swelling.    Past Medical History:  Diagnosis Date  . Chronic abdominal pain   . Esophagitis   . Eye hemorrhage   . Gastritis   . Gastroparesis   . GERD (gastroesophageal reflux disease)   . Glaucoma   . Helicobacter pylori (H. pylori) infection 2016   ERADICATION DOCUMENTED VIA STOOL TEST in AUG 2016 at [facility]  . Hiccups   . Hypertension   . Nausea and vomiting    chronic, recurrent  . Type 2 diabetes mellitus with complications (HCC)    diagnosed around age 47    Patient Active Problem List   Diagnosis Date Noted  . Obesity, Class III, BMI 40-49.9 (morbid obesity) ([place]) 11/02/2017  . Sepsis ([place]) 10/30/2017  . CKD stage 3 due to type 2 diabetes mellitus ([place]) 10/30/2017  . Gangrene of right foot ([place]) 10/30/2017  . ACE inhibitor-aggravated angioedema, initial encounter 10/30/2017  . Right shoulder pain 10/30/2017  . Restlessness and agitation 10/30/2017  . Acute  kidney injury ([place]) 09/21/2017  . Chronic abdominal pain 09/21/2017  . Suicidal ideation 09/21/2017  . Hypertensive urgency 09/21/2017  . Prolonged QT interval 09/21/2017  . Diabetic foot infection ([place]) 07/15/2017  . Sepsis due to cellulitis ([place]) 07/14/2017  . GERD (gastroesophageal reflux disease) 07/14/2017  . Glaucoma 07/14/2017  . Anemia 07/14/2017  . Intractable nausea and vomiting 06/10/2017  . Pressure injury of skin 02/01/2017  . Psychosis ([place]) 01/31/2017  . Hypokalemia 01/31/2017  . AKI (acute kidney injury) ([place]) 09/07/2016  . Insulin dependent diabetes mellitus ([place]) 09/07/2016  . Orthostatic syncope 09/05/2016  . Syncope and collapse   . 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 Diarrhea 09/27/2015  . Gastroparesis due to DM ([place]) 02/06/2015  . Intractable vomiting 01/15/2015  . Gastritis   . Essential hypertension 12/06/2014  . H. pylori infection 11/27/2014  . Mucosal abnormality of stomach   . Mucosal abnormality of esophagus   . Nausea vomiting and diarrhea 10/25/2014  . Diabetic infection of left heel ([place]) 02/14/2010  . LIMB PAIN 12/11/2009  . UNSPECIFIED PERIPHERAL VASCULAR DISEASE 10/19/2009  . ERECTILE DYSFUNCTION, ORGANIC 10/19/2009  . NUMBNESS 07/20/2009  . Diabetes mellitus type 2 with complications, uncontrolled ([place]) 03/22/2009  . Hyperlipidemia 03/22/2009  . Depression 03/22/2009    Past Surgical History:  Procedure Laterality Date  . CATARACT EXTRACTION W/PHACO Left 05/19/2016   Procedure: CATARACT EXTRACTION PHACO AND  INTRAOCULAR LENS PLACEMENT LEFT EYE;  Surgeon: Timothy Branch, MD;  Location: [facility];  Service: Ophthalmology;  Laterality: Left;  CDE: 7.30  . CATARACT EXTRACTION W/PHACO Right 06/23/2016   Procedure: CATARACT EXTRACTION PHACO AND INTRAOCULAR LENS PLACEMENT RIGHT EYE CDE=9.87;  Surgeon: Timothy Branch, MD;  Location: [facility];  Service: Ophthalmology;  Laterality: Right;  right  . ESOPHAGOGASTRODUODENOSCOPY  2015   Dr. Britta Clark  .  ESOPHAGOGASTRODUODENOSCOPY N/A 10/26/2014   RMR: Distal esophagititis-likely reflux related although an element of pill induced injuruy no exclueded  status post biopsy. Diffusely abnormal gastric mucosa of uncertain signigicane -status post gastric biopsy. Focal area of excoriation in the cardia most consistant with a trauma of heaving.   . ESOPHAGOGASTRODUODENOSCOPY  08/2015   [facility]: mild esophagitis and old gastric contents  . EYE SURGERY  2015   stent placed to left eye  . EYE SURGERY    . INCISION AND DRAINAGE OF WOUND Right 07/16/2017   Procedure: IRRIGATION AND DEBRIDEMENT OF SOFT TISSUE OF ULCERATION RIGHT FOOT;  Surgeon: Timothy Clark, DPM;  Location: [facility];  Service: Podiatry;  Laterality: Right;        Home Medications    Prior to Admission medications   Medication Sig Start Date End Date Taking? Authorizing Provider  atorvastatin (LIPITOR) 10 MG tablet Take 10 mg by mouth at bedtime.    Yes [provider]  brimonidine (ALPHAGAN) 0.15 % ophthalmic solution Place 1 drop into both eyes 3 (three) times daily.   Yes [provider]  cetirizine (ZYRTEC) 10 MG tablet Take 10 mg by mouth at bedtime.    Yes [provider]  clindamycin (CLEOCIN) 300 MG capsule Take 1 capsule (300 mg total) by mouth every 6 (six) hours for 21 days. 11/06/17 11/27/17 Yes Clark, Timothy Larry, MD  cloNIDine (CATAPRES) 0.2 MG tablet Take 1 tablet (0.2 mg total) by mouth 2 (two) times daily. 11/06/17  Yes Clark, Timothy Larry, MD  dicyclomine (BENTYL) 10 MG capsule TAKE 1 CAPSULE BY MOUTH 4 TIMES DAILY BEFORE MEAL(S) AND AT BEDTIME Patient taking differently: TAKE 1 CAPSULE (10 MG) BY MOUTH 4 TIMES DAILY - BEFORE MEALS AND AT BEDTIME 08/06/17  Yes Timothy Stable, NP  folic acid (FOLVITE) 1 MG tablet Take 1 tablet (1 mg total) by mouth daily. 09/24/17  Yes Timothy Shell, MD  furosemide (LASIX) 40 MG tablet Take 1 tablet (40 mg total) by mouth daily. 11/07/17  Yes Clark,  Timothy Larry, MD  hydrALAZINE (APRESOLINE) 50 MG tablet Take 1 tablet (50 mg total) by mouth 3 (three) times daily. 11/06/17  Yes Clark, Timothy Larry, MD  insulin aspart protamine- aspart (NOVOLOG MIX 70/30) (70-30) 100 UNIT/ML injection Inject 0.25 mLs (25 Units total) into the skin 2 (two) times daily with a meal. Patient taking differently: Inject 30 Units into the skin 2 (two) times daily with a meal.  09/24/17  Yes Timothy Shell, MD  metoprolol succinate (TOPROL-XL) 100 MG 24 hr tablet Take 1 tablet (100 mg total) by mouth daily. Take with or immediately following a meal. 11/07/17  Yes Clark, Timothy Larry, MD  potassium chloride SA (K-DUR,KLOR-CON) 20 MEQ tablet Take 2 tablets (40 mEq total) by mouth 2 (two) times daily. 11/07/17  Yes Avie Echevaria B, PA-C  sitaGLIPtin (JANUVIA) 100 MG tablet Take 100 mg by mouth daily.   Yes [provider]  thiamine 100 MG tablet Take 1 tablet (100 mg total) by mouth daily. 09/24/17  Yes Timothy Shell, MD  timolol (TIMOPTIC) 0.5 % ophthalmic solution Place 1 drop into both eyes 2 (two) times daily.   Yes [provider]  acetaminophen (TYLENOL) 325 MG tablet Take 2 tablets (650 mg total) by mouth every 6 (six) hours as needed for mild pain (or Fever >/= 101). Patient not taking: Reported on 11/09/2017 09/24/17   Timothy Shell, MD    Family History Family History  Problem Relation Age of Onset  . Ovarian cancer Mother   . Heart attack Father   . Cervical cancer Sister   . Diabetes Sister   . Colon cancer Neg Hx     Social History Social History   Tobacco Use  . Smoking status: Never Smoker  . Smokeless tobacco: Never Used  Substance Use Topics  . Alcohol use: No    Alcohol/week: 0.0 oz  . Drug use: No     Allergies   Donnatal [pb-hyoscy-atropine-scopolamine]; Fish allergy; Haldol [haloperidol lactate]; Lidocaine; Maalox [calcium carbonate antacid]; Lisinopril; Aspirin; Bactrim  [sulfamethoxazole-trimethoprim]; Doxycycline; Keflex [cephalexin]; Penicillins; and Phenergan [promethazine hcl]   Review of Systems Review of Systems  Constitutional: Negative for fever.  Respiratory: Positive for shortness of breath.   All other systems reviewed and are negative.    Physical Exam Updated Vital Signs BP (!) 164/90   Pulse 66   Temp 98.8 F (37.1 C) (Oral)   Resp 13   Ht 6' (1.829 m)   Wt (!) 154.2 kg (340 lb)   SpO2 94%   BMI 46.11 kg/m   Physical Exam  Constitutional: He appears well-developed and well-nourished.  HENT:  Head: Normocephalic and atraumatic.  Mouth/Throat: Oropharynx is clear and moist.  Eyes: Conjunctivae and EOM are normal.  Neck: Neck supple.  Cardiovascular: Normal rate and regular rhythm.  No murmur heard. Pulmonary/Chest: Effort normal and breath sounds normal. No respiratory distress.  Abdominal: Soft. There is no tenderness.  Musculoskeletal: Normal range of motion. He exhibits no edema.       Right lower leg: Normal.       Left lower leg: Normal.  Neurological: He is alert.  Skin: Skin is warm and dry.  Psychiatric: He has a normal mood and affect.  Nursing note and vitals reviewed.  Pt has enlarged scrotum,  Firm to palpation,  Tender to touch,   ED Treatments / Results  Labs (all labs ordered are listed, but only abnormal results are displayed) Labs Reviewed  BASIC METABOLIC PANEL - Abnormal; Notable for the following components:      Result Value   Potassium 3.4 (*)    Glucose, Bld 175 (*)    Creatinine, Ser 1.54 (*)    Calcium 8.0 (*)    GFR calc non Af Amer 52 (*)    All other components within normal limits  CBC - Abnormal; Notable for the following components:   RBC 3.04 (*)    Hemoglobin 9.1 (*)    HCT 27.6 (*)    All other components within normal limits  BRAIN NATRIURETIC PEPTIDE - Abnormal; Notable for the following components:   B Natriuretic Peptide 354.5 (*)    All other components within normal  limits  I-STAT TROPONIN, ED  I-STAT TROPONIN, ED    EKG None  Radiology Dg Chest 2 View  Result Date: 11/09/2017 CLINICAL DATA:  Shortness of Breath EXAM: CHEST - 2 VIEW COMPARISON:  11/07/2017 FINDINGS: The heart size and mediastinal contours are within normal limits. Both lungs are clear. The visualized skeletal structures are unremarkable. IMPRESSION: No active  cardiopulmonary disease. Electronically Signed   By: Inez Catalina M.D.   On: 11/09/2017 16:36   US Scrotum  Result Date: 11/09/2017 CLINICAL DATA:  Testicular swelling since Saturday EXAM: SCROTAL ULTRASOUND DOPPLER ULTRASOUND OF THE TESTICLES TECHNIQUE: Complete ultrasound examination of the testicles, epididymis, and other scrotal structures was performed. Color and spectral Doppler ultrasound were also utilized to evaluate blood flow to the testicles. COMPARISON:  None. FINDINGS: Right testicle Measurements: 4.7 x 3.1 x 3.1 cm. No mass or microlithiasis visualized. Left testicle Measurements: 4.8 x 3.4 x 3.4 cm. No mass or microlithiasis visualized. Right epididymis:  Normal in size and appearance.  Mild hyperemia. Left epididymis:  Normal in size and appearance. Hydrocele:  None visualized. Varicocele:  None visualized. Pulsed Doppler interrogation of both testes demonstrates normal low resistance arterial and venous waveforms bilaterally. Additional comments: Diffuse scrotal wall thickening/edema. No discrete fluid collection/abscess. IMPRESSION: Normal sonographic appearance of the bilateral testes. No evidence of testicular torsion. Mild hyperemia of the right epididymis, raising the possibility of epididymitis, although this is equivocal. Diffuse scrotal wall thickening/edema, without evidence of scrotal wall abscess. Electronically Signed   By: Julian Hy M.D.   On: 11/09/2017 19:46   US Scrotum W/doppler  Result Date: 11/09/2017 CLINICAL DATA:  Testicular swelling since Saturday EXAM: SCROTAL ULTRASOUND DOPPLER ULTRASOUND  OF THE TESTICLES TECHNIQUE: Complete ultrasound examination of the testicles, epididymis, and other scrotal structures was performed. Color and spectral Doppler ultrasound were also utilized to evaluate blood flow to the testicles. COMPARISON:  None. FINDINGS: Right testicle Measurements: 4.7 x 3.1 x 3.1 cm. No mass or microlithiasis visualized. Left testicle Measurements: 4.8 x 3.4 x 3.4 cm. No mass or microlithiasis visualized. Right epididymis:  Normal in size and appearance.  Mild hyperemia. Left epididymis:  Normal in size and appearance. Hydrocele:  None visualized. Varicocele:  None visualized. Pulsed Doppler interrogation of both testes demonstrates normal low resistance arterial and venous waveforms bilaterally. Additional comments: Diffuse scrotal wall thickening/edema. No discrete fluid collection/abscess. IMPRESSION: Normal sonographic appearance of the bilateral testes. No evidence of testicular torsion. Mild hyperemia of the right epididymis, raising the possibility of epididymitis, although this is equivocal. Diffuse scrotal wall thickening/edema, without evidence of scrotal wall abscess. Electronically Signed   By: Julian Hy M.D.   On: 11/09/2017 19:46    Procedures Procedures (including critical care time)  Medications Ordered in ED Medications  furosemide (LASIX) injection 40 mg (40 mg Intravenous Given 11/09/17 1737)     Initial Impression / Assessment and Plan / ED Course  I have reviewed the triage vital signs and the nursing notes.  Pertinent labs & imaging results that were available during my care of the patient were reviewed by me and considered in my medical decision making (see chart for details).  Clinical Course as of Nov 10 2038  Mon Nov 09, 2017  4010 Basic metabolic panel(!) [LS]  2725 Basic metabolic panel(!) [LS]    Clinical Course User Index [LS] Fransico Meadow, PA-C    MDM  Ultrasound shows no evidence of torsion or scrotal mass/abscess.  Pt given  Iv lasix.  Pt is breathing better.  No change in scrotum.  I discussed with Dr. Shanon Brow who will see pt for admission.   Final Clinical Impressions(s) / ED Diagnoses   Final diagnoses:  Swelling  Scrotal edema  Edema of abdominal wall  Shortness of breath    ED Discharge Orders    None       Fransico Meadow,  PA-C 11/09/17 2051    Daleen Bo, MD 11/11/17 2029

## 2017-11-09 NOTE — ED Notes (Signed)
Patient transported to Ultrasound 

## 2017-11-09 NOTE — ED Triage Notes (Signed)
Pt c/o swelling in legs bilaterally, and groin swelling. States that he has been taking lasix but has not being urinating as much as he thinks he should states "they are not working, actually working in reverse making me retain more fluid".

## 2017-11-09 NOTE — H&P (Signed)
History and Physical    [name]:[id number] DOB: 21-Oct-1970 DOA: 11/09/2017  PCP: Timothy Kaufman, PA-C  Patient coming from: Home  Chief Complaint: Scrotal swelling  HPI: Timothy Clark is a 47 y.o. male with medical history significant of diabetes, morbid obesity, recent hospitalization for sepsis/cellulitis received a lot of fluids and has since been third spacing.  Patient was discharged home on 11/06/2017 on a course of clindamycin and Lasix.  Patient went home and he was still having a significant amount of swelling diffusely particularly in his scrotum and his legs.  He has been getting his legs Ace wrapped and that has improved however his scrotal swelling has not gone down.  Patient came the emergency department on 11/07/2017 was given an extra dose of Lasix.  He has been taking Lasix 40 mg p.o. daily at home.  Patient came back in today because it is not any better yet.  Patient being referred for admission to diurese more to help with his general anasarca.  Patient denies any chest pain or shortness of breath or fevers or cough.  Review of Systems: As per HPI otherwise 10 point review of systems negative.   Past Medical History:  Diagnosis Date  . Chronic abdominal pain   . Esophagitis   . Eye hemorrhage   . Gastritis   . Gastroparesis   . GERD (gastroesophageal reflux disease)   . Glaucoma   . Helicobacter pylori (H. pylori) infection 2016   ERADICATION DOCUMENTED VIA STOOL TEST in AUG 2016 at [place]  . Hiccups   . Hypertension   . Nausea and vomiting    chronic, recurrent  . Type 2 diabetes mellitus with complications (HCC)    diagnosed around age 47    Past Surgical History:  Procedure Laterality Date  . CATARACT EXTRACTION W/PHACO Left 05/19/2016   Procedure: CATARACT EXTRACTION PHACO AND INTRAOCULAR LENS PLACEMENT LEFT EYE;  Surgeon: Tonny Branch, MD;  Location: [facility];  Service: Ophthalmology;  Laterality: Left;  CDE: 7.30  . CATARACT EXTRACTION  W/PHACO Right 06/23/2016   Procedure: CATARACT EXTRACTION PHACO AND INTRAOCULAR LENS PLACEMENT RIGHT EYE CDE=9.87;  Surgeon: Tonny Branch, MD;  Location: [facility];  Service: Ophthalmology;  Laterality: Right;  right  . ESOPHAGOGASTRODUODENOSCOPY  2015   Dr. Britta Mccreedy  . ESOPHAGOGASTRODUODENOSCOPY N/A 10/26/2014   RMR: Distal esophagititis-likely reflux related although an element of pill induced injuruy no exclueded  status post biopsy. Diffusely abnormal gastric mucosa of uncertain signigicane -status post gastric biopsy. Focal area of excoriation in the cardia most consistant with a trauma of heaving.   . ESOPHAGOGASTRODUODENOSCOPY  08/2015   [facility]: mild esophagitis and old gastric contents  . EYE SURGERY  2015   stent placed to left eye  . EYE SURGERY    . INCISION AND DRAINAGE OF WOUND Right 07/16/2017   Procedure: IRRIGATION AND DEBRIDEMENT OF SOFT TISSUE OF ULCERATION RIGHT FOOT;  Surgeon: Caprice Beaver, DPM;  Location: [facility];  Service: Podiatry;  Laterality: Right;     reports that he has never smoked. He has never used smokeless tobacco. He reports that he does not drink alcohol or use drugs.  Allergies  Allergen Reactions  . Donnatal [Pb-Hyoscy-Atropine-Scopolamine] Anaphylaxis    Reaction to GI cocktail  . Fish Allergy Anaphylaxis, Hives and Rash  . Haldol [Haloperidol Lactate] Other (See Comments)    Chest Pain  . Lidocaine Anaphylaxis    Reaction to GI cocktail  . Maalox [Calcium Carbonate Antacid] Anaphylaxis  Reaction to GI cocktail  . Lisinopril Swelling  . Aspirin Hives and Itching  . Bactrim [Sulfamethoxazole-Trimethoprim] Itching  . Doxycycline Hives and Nausea And Vomiting    Severe   . Keflex [Cephalexin] Hives, Itching and Nausea And Vomiting  . Penicillins Hives and Itching    Has patient had a PCN reaction causing immediate rash, facial/tongue/throat swelling, SOB or lightheadedness with hypotension: yes Has patient had a PCN reaction causing severe rash  involving mucus membranes or skin necrosis: yes Has patient had a PCN reaction that required hospitalization no Has patient had a PCN reaction occurring within the last 10 years: yes If all of the above answers are "NO", then may proceed with Cephalospor  . Phenergan [Promethazine Hcl] Nausea And Vomiting    Family History  Problem Relation Age of Onset  . Ovarian cancer Mother   . Heart attack Father   . Cervical cancer Sister   . Diabetes Sister   . Colon cancer Neg Hx     Prior to Admission medications   Medication Sig Start Date End Date Taking? Authorizing Provider  atorvastatin (LIPITOR) 10 MG tablet Take 10 mg by mouth at bedtime.    Yes [provider]  brimonidine (ALPHAGAN) 0.15 % ophthalmic solution Place 1 drop into both eyes 3 (three) times daily.   Yes [provider]  cetirizine (ZYRTEC) 10 MG tablet Take 10 mg by mouth at bedtime.    Yes [provider]  clindamycin (CLEOCIN) 300 MG capsule Take 1 capsule (300 mg total) by mouth every 6 (six) hours for 21 days. 11/06/17 11/27/17 Yes Danford, Suann Larry, MD  cloNIDine (CATAPRES) 0.2 MG tablet Take 1 tablet (0.2 mg total) by mouth 2 (two) times daily. 11/06/17  Yes Danford, Suann Larry, MD  dicyclomine (BENTYL) 10 MG capsule TAKE 1 CAPSULE BY MOUTH 4 TIMES DAILY BEFORE MEAL(S) AND AT BEDTIME Patient taking differently: TAKE 1 CAPSULE (10 MG) BY MOUTH 4 TIMES DAILY - BEFORE MEALS AND AT BEDTIME 08/06/17  Yes Carlis Stable, NP  folic acid (FOLVITE) 1 MG tablet Take 1 tablet (1 mg total) by mouth daily. 09/24/17  Yes Georgette Shell, MD  furosemide (LASIX) 40 MG tablet Take 1 tablet (40 mg total) by mouth daily. 11/07/17  Yes Danford, Suann Larry, MD  hydrALAZINE (APRESOLINE) 50 MG tablet Take 1 tablet (50 mg total) by mouth 3 (three) times daily. 11/06/17  Yes Danford, Suann Larry, MD  insulin aspart protamine- aspart (NOVOLOG MIX 70/30) (70-30) 100 UNIT/ML injection Inject 0.25 mLs (25 Units  total) into the skin 2 (two) times daily with a meal. Patient taking differently: Inject 30 Units into the skin 2 (two) times daily with a meal.  09/24/17  Yes Georgette Shell, MD  metoprolol succinate (TOPROL-XL) 100 MG 24 hr tablet Take 1 tablet (100 mg total) by mouth daily. Take with or immediately following a meal. 11/07/17  Yes Danford, Suann Larry, MD  potassium chloride SA (K-DUR,KLOR-CON) 20 MEQ tablet Take 2 tablets (40 mEq total) by mouth 2 (two) times daily. 11/07/17  Yes Avie Echevaria B, PA-C  sitaGLIPtin (JANUVIA) 100 MG tablet Take 100 mg by mouth daily.   Yes [provider]  thiamine 100 MG tablet Take 1 tablet (100 mg total) by mouth daily. 09/24/17  Yes Georgette Shell, MD  timolol (TIMOPTIC) 0.5 % ophthalmic solution Place 1 drop into both eyes 2 (two) times daily.   Yes [provider]  acetaminophen (TYLENOL) 325 MG tablet Take  2 tablets (650 mg total) by mouth every 6 (six) hours as needed for mild pain (or Fever >/= 101). Patient not taking: Reported on 11/09/2017 09/24/17   Georgette Shell, MD    Physical Exam: Vitals:   11/09/17 1945 11/09/17 2000 11/09/17 2015 11/09/17 2045  BP: (!) 150/85 (!) 157/69 (!) 164/90 126/78  Pulse: (!) 104 66  87  Resp: 12 14 13 15   Temp:      TempSrc:      SpO2: 98%  94% 99%  Weight:      Height:          Constitutional: NAD, calm, comfortable Vitals:   11/09/17 1945 11/09/17 2000 11/09/17 2015 11/09/17 2045  BP: (!) 150/85 (!) 157/69 (!) 164/90 126/78  Pulse: (!) 104 66  87  Resp: 12 14 13 15   Temp:      TempSrc:      SpO2: 98%  94% 99%  Weight:      Height:       Eyes: PERRL, lids and conjunctivae normal ENMT: Mucous membranes are moist. Posterior pharynx clear of any exudate or lesions.Normal dentition.  Neck: normal, supple, no masses, no thyromegaly Respiratory: clear to auscultation bilaterally, no wheezing, no crackles. Normal respiratory effort. No accessory muscle use.    Cardiovascular: Regular rate and rhythm, no murmurs / rubs / gallops.  1-2+ extremity edema. 2+ pedal pulses. No carotid bruits.  Abdomen: no tenderness, no masses palpated. No hepatosplenomegaly. Bowel sounds positive.  Moderate scrotal swelling Musculoskeletal: no clubbing / cyanosis. No joint deformity upper and lower extremities. Good ROM, no contractures. Normal muscle tone.  Skin: no rashes, lesions, ulcers. No induration Neurologic: CN 2-12 grossly intact. Sensation intact, DTR normal. Strength 5/5 in all 4.  Psychiatric: Normal judgment and insight. Alert and oriented x 3. Normal mood.    Labs on Admission: I have personally reviewed following labs and imaging studies  CBC: Recent Labs  Lab 11/04/17 0439 11/07/17 1544 11/09/17 1308  WBC 6.2 8.6 8.0  NEUTROABS  --  6.6  --   HGB 9.4* 9.7* 9.1*  HCT 28.5* 29.4* 27.6*  MCV 89.9 91.0 90.8  PLT 134* 175 245   Basic Metabolic Panel: Recent Labs  Lab 11/04/17 0439 11/04/17 1537 11/05/17 0436 11/06/17 0500 11/07/17 1544 11/09/17 1308  NA 138  --  142 142 138 138  K 2.6* 3.7 2.7* 4.0 3.1* 3.4*  CL 108  --  109 111 105 106  CO2 23  --  26 23 24 24   GLUCOSE 87  --  72 83 148* 175*  BUN 5*  --  5* 7 5* 8  CREATININE 1.26*  --  1.34* 1.40* 1.31* 1.54*  CALCIUM 7.7*  --  7.9* 8.4* 8.0* 8.0*   GFR: Estimated Creatinine Clearance: 90.8 mL/min (A) (by C-G formula based on SCr of 1.54 mg/dL (H)). Liver Function Tests: Recent Labs  Lab 11/07/17 1544  AST 30  ALT 17  ALKPHOS 86  BILITOT 0.6  PROT 5.1*  ALBUMIN 2.0*   No results for input(s): LIPASE, AMYLASE in the last 168 hours. No results for input(s): AMMONIA in the last 168 hours. Coagulation Profile: No results for input(s): INR, PROTIME in the last 168 hours. Cardiac Enzymes: No results for input(s): CKTOTAL, CKMB, CKMBINDEX, TROPONINI in the last 168 hours. BNP (last 3 results) No results for input(s): PROBNP in the last 8760 hours. HbA1C: No results for  input(s): HGBA1C in the last 72 hours. CBG: Recent Labs  Lab 11/05/17 1203 11/05/17 1652 11/05/17 2131 11/06/17 0448 11/06/17 0753  GLUCAP 135* 98 90 81 119*   Lipid Profile: No results for input(s): CHOL, HDL, LDLCALC, TRIG, CHOLHDL, LDLDIRECT in the last 72 hours. Thyroid Function Tests: No results for input(s): TSH, T4TOTAL, FREET4, T3FREE, THYROIDAB in the last 72 hours. Anemia Panel: No results for input(s): VITAMINB12, FOLATE, FERRITIN, TIBC, IRON, RETICCTPCT in the last 72 hours. Urine analysis:    Component Value Date/Time   COLORURINE YELLOW 10/30/2017 2332   APPEARANCEUR HAZY (A) 10/30/2017 2332   LABSPEC 1.013 10/30/2017 2332   PHURINE 7.0 10/30/2017 2332   GLUCOSEU 150 (A) 10/30/2017 2332   HGBUR SMALL (A) 10/30/2017 2332   BILIRUBINUR NEGATIVE 10/30/2017 2332   KETONESUR 5 (A) 10/30/2017 2332   PROTEINUR >=300 (A) 10/30/2017 2332   UROBILINOGEN 0.2 06/07/2015 1353   NITRITE NEGATIVE 10/30/2017 2332   LEUKOCYTESUR NEGATIVE 10/30/2017 2332   Sepsis Labs: !!!!!!!!!!!!!!!!!!!!!!!!!!!!!!!!!!!!!!!!!!!! @LABRCNTIP (procalcitonin:4,lacticidven:4) ) Recent Results (from the past 240 hour(s))  Culture, blood (Routine x 2)     Status: None   Collection Time: 10/30/17  9:21 PM  Result Value Ref Range Status   Specimen Description   Final    BLOOD LEFT FOREARM Performed at [facility], [place] [address], [place], [place] [address]    Special Requests   Final    BOTTLES DRAWN AEROBIC AND ANAEROBIC Blood Culture adequate volume   Culture   Final    NO GROWTH 5 DAYS Performed at [facility], [place] [address], [place], [place] [address]    Report Status 11/04/2017 FINAL  Final  Urine culture     Status: Abnormal   Collection Time: 10/30/17 11:32 PM  Result Value Ref Range Status   Specimen Description   Final    URINE, CLEAN CATCH Performed at [facility], [place] [address] [place], [place] [address]    Special Requests    Final    Normal Performed at [facility], [place] [address], [place], [place] [address]    Culture (A)  Final    50,000 COLONIES/mL DIPHTHEROIDS(CORYNEBACTERIUM SPECIES) Standardized susceptibility testing for this organism is not available. Performed at [facility], [place] [address] [place], [place] [address]    Report Status 11/01/2017 FINAL  Final  MRSA PCR Screening     Status: None   Collection Time: 10/31/17  2:54 AM  Result Value Ref Range Status   MRSA by PCR NEGATIVE NEGATIVE Final    Comment:        The GeneXpert MRSA Assay (FDA approved for NASAL specimens only), is one component of a comprehensive MRSA colonization surveillance program. It is not intended to diagnose MRSA infection nor to guide or monitor treatment for MRSA infections. Performed at [facility], [place] [address], [place], [place] [address]   Urine Culture     Status: None   Collection Time: 11/03/17  1:42 AM  Result Value Ref Range Status   Specimen Description URINE, RANDOM  Final   Special Requests NONE  Final   Culture   Final    NO GROWTH Performed at [facility], [place] [address], [place],  [address]    Report Status 11/04/2017 FINAL  Final     Radiological Exams on Admission: Dg Chest 2 View  Result Date: 11/09/2017 CLINICAL DATA:  Shortness of Breath EXAM: CHEST - 2 VIEW COMPARISON:  11/07/2017 FINDINGS: The heart size and mediastinal contours are within  normal limits. Both lungs are clear. The visualized skeletal structures are unremarkable. IMPRESSION: No active cardiopulmonary disease. Electronically Signed   By: Inez Catalina M.D.   On: 11/09/2017 16:36   US Scrotum  Result Date: 11/09/2017 CLINICAL DATA:  Testicular swelling since Saturday EXAM: SCROTAL ULTRASOUND DOPPLER ULTRASOUND OF THE TESTICLES TECHNIQUE: Complete ultrasound examination of the testicles, epididymis, and other scrotal structures was performed. Color and spectral Doppler  ultrasound were also utilized to evaluate blood flow to the testicles. COMPARISON:  None. FINDINGS: Right testicle Measurements: 4.7 x 3.1 x 3.1 cm. No mass or microlithiasis visualized. Left testicle Measurements: 4.8 x 3.4 x 3.4 cm. No mass or microlithiasis visualized. Right epididymis:  Normal in size and appearance.  Mild hyperemia. Left epididymis:  Normal in size and appearance. Hydrocele:  None visualized. Varicocele:  None visualized. Pulsed Doppler interrogation of both testes demonstrates normal low resistance arterial and venous waveforms bilaterally. Additional comments: Diffuse scrotal wall thickening/edema. No discrete fluid collection/abscess. IMPRESSION: Normal sonographic appearance of the bilateral testes. No evidence of testicular torsion. Mild hyperemia of the right epididymis, raising the possibility of epididymitis, although this is equivocal. Diffuse scrotal wall thickening/edema, without evidence of scrotal wall abscess. Electronically Signed   By: Julian Hy M.D.   On: 11/09/2017 19:46   US Scrotum W/doppler  Result Date: 11/09/2017 CLINICAL DATA:  Testicular swelling since Saturday EXAM: SCROTAL ULTRASOUND DOPPLER ULTRASOUND OF THE TESTICLES TECHNIQUE: Complete ultrasound examination of the testicles, epididymis, and other scrotal structures was performed. Color and spectral Doppler ultrasound were also utilized to evaluate blood flow to the testicles. COMPARISON:  None. FINDINGS: Right testicle Measurements: 4.7 x 3.1 x 3.1 cm. No mass or microlithiasis visualized. Left testicle Measurements: 4.8 x 3.4 x 3.4 cm. No mass or microlithiasis visualized. Right epididymis:  Normal in size and appearance.  Mild hyperemia. Left epididymis:  Normal in size and appearance. Hydrocele:  None visualized. Varicocele:  None visualized. Pulsed Doppler interrogation of both testes demonstrates normal low resistance arterial and venous waveforms bilaterally. Additional comments: Diffuse scrotal  wall thickening/edema. No discrete fluid collection/abscess. IMPRESSION: Normal sonographic appearance of the bilateral testes. No evidence of testicular torsion. Mild hyperemia of the right epididymis, raising the possibility of epididymitis, although this is equivocal. Diffuse scrotal wall thickening/edema, without evidence of scrotal wall abscess. Electronically Signed   By: Julian Hy M.D.   On: 11/09/2017 19:46    Old chart review Case discussed with EDP Chest x-ray reviewed no edema or infiltrate  Assessment/Plan 47 year old male with recent hospitalization for sepsis received a significant amount of IV fluids now with generalized swelling particularly bothersome in the scrotum Principal Problem:   Swelling mostly in the scrotum-we will placed on Lasix IV 40 mg IV every 12 hours.  I explained to the patient that this is just going to take some time to go down.  His legs are improving.  Continue to elevate extremities and elevate scrotum if possible.  Imaging and ultrasound of the scrotum does not show any acute abnormalities.  Vital signs are stable with O2 sats of 95% on room air.  Chest x-ray is negative.  Again I think this is all from his recent hospitalization and receiving over 8 L of IV fluid during that time.  Active Problems:   Essential hypertension-continue home meds   Chronic abdominal pain-continue home meds   CKD stage 3 due to type 2 diabetes mellitus (HCC)-continue 7030 home insulin     DVT prophylaxis: SCDs and Ace wrap to  bilateral lower extremities Code Status: Full Family Communication: Wife Disposition Plan: Per day team Consults called: None Admission status:Observation   Elzina Devera A MD [facility]  If 7PM-7AM, please contact night-coverage [email or web address] Password [facility]  11/09/2017, 9:14 PM

## 2017-11-10 ENCOUNTER — Ambulatory Visit: Payer: Medicare HMO | Admitting: Cardiovascular Disease

## 2017-11-10 ENCOUNTER — Other Ambulatory Visit: Payer: Self-pay

## 2017-11-10 DIAGNOSIS — H409 Unspecified glaucoma: Secondary | ICD-10-CM | POA: Diagnosis present

## 2017-11-10 DIAGNOSIS — Z794 Long term (current) use of insulin: Secondary | ICD-10-CM | POA: Diagnosis not present

## 2017-11-10 DIAGNOSIS — E785 Hyperlipidemia, unspecified: Secondary | ICD-10-CM | POA: Diagnosis present

## 2017-11-10 DIAGNOSIS — G8929 Other chronic pain: Secondary | ICD-10-CM | POA: Diagnosis present

## 2017-11-10 DIAGNOSIS — R600 Localized edema: Secondary | ICD-10-CM | POA: Diagnosis not present

## 2017-11-10 DIAGNOSIS — L03115 Cellulitis of right lower limb: Secondary | ICD-10-CM | POA: Diagnosis present

## 2017-11-10 DIAGNOSIS — K3184 Gastroparesis: Secondary | ICD-10-CM | POA: Diagnosis present

## 2017-11-10 DIAGNOSIS — E11628 Type 2 diabetes mellitus with other skin complications: Secondary | ICD-10-CM | POA: Diagnosis present

## 2017-11-10 DIAGNOSIS — E1122 Type 2 diabetes mellitus with diabetic chronic kidney disease: Secondary | ICD-10-CM | POA: Diagnosis present

## 2017-11-10 DIAGNOSIS — Z6841 Body Mass Index (BMI) 40.0 and over, adult: Secondary | ICD-10-CM | POA: Diagnosis not present

## 2017-11-10 DIAGNOSIS — Z961 Presence of intraocular lens: Secondary | ICD-10-CM | POA: Diagnosis present

## 2017-11-10 DIAGNOSIS — R609 Edema, unspecified: Secondary | ICD-10-CM | POA: Diagnosis not present

## 2017-11-10 DIAGNOSIS — N183 Chronic kidney disease, stage 3 (moderate): Secondary | ICD-10-CM | POA: Diagnosis present

## 2017-11-10 DIAGNOSIS — I129 Hypertensive chronic kidney disease with stage 1 through stage 4 chronic kidney disease, or unspecified chronic kidney disease: Secondary | ICD-10-CM | POA: Diagnosis present

## 2017-11-10 DIAGNOSIS — E876 Hypokalemia: Secondary | ICD-10-CM | POA: Diagnosis present

## 2017-11-10 DIAGNOSIS — I10 Essential (primary) hypertension: Secondary | ICD-10-CM | POA: Diagnosis not present

## 2017-11-10 DIAGNOSIS — R0602 Shortness of breath: Secondary | ICD-10-CM | POA: Diagnosis present

## 2017-11-10 DIAGNOSIS — R109 Unspecified abdominal pain: Secondary | ICD-10-CM | POA: Diagnosis not present

## 2017-11-10 DIAGNOSIS — N5089 Other specified disorders of the male genital organs: Secondary | ICD-10-CM | POA: Diagnosis present

## 2017-11-10 DIAGNOSIS — K219 Gastro-esophageal reflux disease without esophagitis: Secondary | ICD-10-CM | POA: Diagnosis present

## 2017-11-10 DIAGNOSIS — Z79899 Other long term (current) drug therapy: Secondary | ICD-10-CM | POA: Diagnosis not present

## 2017-11-10 DIAGNOSIS — E1143 Type 2 diabetes mellitus with diabetic autonomic (poly)neuropathy: Secondary | ICD-10-CM | POA: Diagnosis present

## 2017-11-10 DIAGNOSIS — Z8249 Family history of ischemic heart disease and other diseases of the circulatory system: Secondary | ICD-10-CM | POA: Diagnosis not present

## 2017-11-10 DIAGNOSIS — Z833 Family history of diabetes mellitus: Secondary | ICD-10-CM | POA: Diagnosis not present

## 2017-11-10 DIAGNOSIS — D638 Anemia in other chronic diseases classified elsewhere: Secondary | ICD-10-CM | POA: Diagnosis present

## 2017-11-10 DIAGNOSIS — R601 Generalized edema: Secondary | ICD-10-CM | POA: Diagnosis present

## 2017-11-10 DIAGNOSIS — E1151 Type 2 diabetes mellitus with diabetic peripheral angiopathy without gangrene: Secondary | ICD-10-CM | POA: Diagnosis present

## 2017-11-10 DIAGNOSIS — Z888 Allergy status to other drugs, medicaments and biological substances status: Secondary | ICD-10-CM | POA: Diagnosis not present

## 2017-11-10 LAB — COMPREHENSIVE METABOLIC PANEL
ALT: 11 U/L — ABNORMAL LOW (ref 17–63)
AST: 14 U/L — ABNORMAL LOW (ref 15–41)
Albumin: 1.9 g/dL — ABNORMAL LOW (ref 3.5–5.0)
Alkaline Phosphatase: 58 U/L (ref 38–126)
Anion gap: 8 (ref 5–15)
BUN: 7 mg/dL (ref 6–20)
CO2: 27 mmol/L (ref 22–32)
Calcium: 7.7 mg/dL — ABNORMAL LOW (ref 8.9–10.3)
Chloride: 104 mmol/L (ref 101–111)
Creatinine, Ser: 1.45 mg/dL — ABNORMAL HIGH (ref 0.61–1.24)
GFR calc Af Amer: >60 mL/min (ref >60)
GFR calc non Af Amer: 56 mL/min — ABNORMAL LOW (ref >60)
Glucose, Bld: 193 mg/dL — ABNORMAL HIGH (ref 65–99)
Potassium: 2.8 mmol/L — ABNORMAL LOW (ref 3.5–5.1)
Sodium: 139 mmol/L (ref 135–145)
Total Bilirubin: 0.5 mg/dL (ref 0.3–1.2)
Total Protein: 4.9 g/dL — ABNORMAL LOW (ref 6.5–8.1)

## 2017-11-10 LAB — CBC
HCT: 27.8 % — ABNORMAL LOW (ref 39.0–52.0)
Hemoglobin: 8.9 g/dL — ABNORMAL LOW (ref 13.0–17.0)
MCH: 29.4 pg (ref 26.0–34.0)
MCHC: 32 g/dL (ref 30.0–36.0)
MCV: 91.7 fL (ref 78.0–100.0)
Platelets: 172 10*3/uL (ref 150–400)
RBC: 3.03 MIL/uL — ABNORMAL LOW (ref 4.22–5.81)
RDW: 14.6 % (ref 11.5–15.5)
WBC: 6.5 10*3/uL (ref 4.0–10.5)

## 2017-11-10 MED ORDER — POTASSIUM CHLORIDE CRYS ER 20 MEQ PO TBCR
40.0000 meq | EXTENDED_RELEASE_TABLET | Freq: Once | ORAL | Status: AC
  Administered 2017-11-10: 40 meq via ORAL
  Filled 2017-11-10: qty 2

## 2017-11-10 MED ORDER — LORAZEPAM 1 MG PO TABS
1.0000 mg | ORAL_TABLET | Freq: Once | ORAL | Status: AC
  Administered 2017-11-10: 1 mg via ORAL
  Filled 2017-11-10: qty 1

## 2017-11-10 NOTE — Progress Notes (Signed)
PROGRESS NOTE    Timothy Clark  WGY:[id number] DOB: 1970-12-07 DOA: 11/09/2017 PCP: Rosalee Kaufman, PA-C    Brief Narrative: By Dr Laqueta Carina is a 47 y.o. male with medical history significant of diabetes, morbid obesity, recent hospitalization for sepsis/cellulitis received a lot of fluids and has since been third spacing.  Patient was discharged home on 11/06/2017 on a course of clindamycin and Lasix.  Patient went home and he was still having a significant amount of swelling diffusely particularly in his scrotum and his legs.  He has been getting his legs Ace wrapped and that has improved however his scrotal swelling has not gone down.  Patient came the emergency department on 11/07/2017 was given an extra dose of Lasix.  He has been taking Lasix 40 mg p.o. daily at home.  Patient came back in today because it is not any better yet.  Patient being referred for admission to diurese more to help with his general anasarca.  Patient denies any chest pain or shortness of breath or fevers or cough.  47 year old male with recent hospitalization for sepsis received a significant amount of IV fluids now with generalized swelling particularly bothersome in the scrotum   Assessment & Plan:   Principal Problem:   Swelling Active Problems:   Essential hypertension   Chronic abdominal pain   CKD stage 3 due to type 2 diabetes mellitus (HCC)  Anasarca, scrotal edema;  IV lasix BID. Scrotum elevation.   Diabetic foot, right leg;  Recent admission for sepsis, discharge 3-29 Ulcer related to small vessel diseases and neuropathy. Had vascular evaluation last admission arterial duplex no large vessel occlusion.  Ortho recommend conservative tx with antibiotics.  Needs 3 weeks of clindamycin as recommend by ID on last admission.  -will get wound care.   CKD stage III; cr range 1.3.  monitor renal function on lasix.  Cr at 1.4 today.   Hypokalemia;  Replaced orally.   HTN;  continue with hydralazine, metoprolol, clonidine.   DM; continue with 70/30   Anemia; follow trend. Hb stable.  Hb range 9.  Chronic abdominal pain. Gastroparesis.   DVT prophylaxis: scd Code Status: full code.  Family Communication:care discussed with patient  Disposition Plan: home when edema improved.   Consultants:   none   Procedures:   [place];    Antimicrobials:   Clindamycin.    Subjective: He is feeling ok, still with significant LE and scrotal swelling   Objective: Vitals:   11/10/17 0540 11/10/17 0909 11/10/17 0926 11/10/17 1345  BP: 132/74 135/79 135/79 138/83  Pulse: 65   64  Resp: 18   20  Temp: 98.2 F (36.8 C)   97.8 F (36.6 C)  TempSrc: Oral   Oral  SpO2: 100%   100%  Weight:      Height:        Intake/Output Summary (Last 24 hours) at 11/10/2017 1358 Last data filed at 11/10/2017 1347 Gross per 24 hour  Intake 688 ml  Output 4300 ml  Net -3612 ml   Filed Weights   11/09/17 1306 11/10/17 0036  Weight: (!) 154.2 kg (340 lb) (!) 145.8 kg (321 lb 8 oz)    Examination:  General exam: Appears calm and comfortable  Respiratory system: Clear to auscultation. Respiratory effort normal. Cardiovascular system: S1 & S2 heard, RRR. No JVD, murmurs, rubs, gallops or clicks.  Gastrointestinal system: Abdomen is nondistended, soft and nontender. No organomegaly or masses felt. Normal bowel sounds heard. Scrotal  with  Significant edema Central nervous system: Alert and oriented. No focal neurological deficits. Extremities: Symmetric 5 x 5 power. Plus 2 edema.  Skin: No rashes, lesions or ulcers    Data Reviewed: I have personally reviewed following labs and imaging studies  CBC: Recent Labs  Lab 11/04/17 0439 11/07/17 1544 11/09/17 1308 11/10/17 0650  WBC 6.2 8.6 8.0 6.5  NEUTROABS  --  6.6  --   --   HGB 9.4* 9.7* 9.1* 8.9*  HCT 28.5* 29.4* 27.6* 27.8*  MCV 89.9 91.0 90.8 91.7  PLT 134* 175 181 469   Basic Metabolic Panel: Recent Labs    Lab 11/05/17 0436 11/06/17 0500 11/07/17 1544 11/09/17 1308 11/10/17 0650  NA 142 142 138 138 139  K 2.7* 4.0 3.1* 3.4* 2.8*  CL 109 111 105 106 104  CO2 26 23 24 24 27   GLUCOSE 72 83 148* 175* 193*  BUN 5* 7 5* 8 7  CREATININE 1.34* 1.40* 1.31* 1.54* 1.45*  CALCIUM 7.9* 8.4* 8.0* 8.0* 7.7*   GFR: Estimated Creatinine Clearance: 93.4 mL/min (A) (by C-G formula based on SCr of 1.45 mg/dL (H)). Liver Function Tests: Recent Labs  Lab 11/07/17 1544 11/10/17 0650  AST 30 14*  ALT 17 11*  ALKPHOS 86 58  BILITOT 0.6 0.5  PROT 5.1* 4.9*  ALBUMIN 2.0* 1.9*   No results for input(s): LIPASE, AMYLASE in the last 168 hours. No results for input(s): AMMONIA in the last 168 hours. Coagulation Profile: No results for input(s): INR, PROTIME in the last 168 hours. Cardiac Enzymes: No results for input(s): CKTOTAL, CKMB, CKMBINDEX, TROPONINI in the last 168 hours. BNP (last 3 results) No results for input(s): PROBNP in the last 8760 hours. HbA1C: No results for input(s): HGBA1C in the last 72 hours. CBG: Recent Labs  Lab 11/05/17 1203 11/05/17 1652 11/05/17 2131 11/06/17 0448 11/06/17 0753  GLUCAP 135* 98 90 81 119*   Lipid Profile: No results for input(s): CHOL, HDL, LDLCALC, TRIG, CHOLHDL, LDLDIRECT in the last 72 hours. Thyroid Function Tests: No results for input(s): TSH, T4TOTAL, FREET4, T3FREE, THYROIDAB in the last 72 hours. Anemia Panel: No results for input(s): VITAMINB12, FOLATE, FERRITIN, TIBC, IRON, RETICCTPCT in the last 72 hours. Sepsis Labs: No results for input(s): PROCALCITON, LATICACIDVEN in the last 168 hours.  Recent Results (from the past 240 hour(s))  Urine Culture     Status: None   Collection Time: 11/03/17  1:42 AM  Result Value Ref Range Status   Specimen Description URINE, RANDOM  Final   Special Requests NONE  Final   Culture   Final    NO GROWTH Performed at [facility], [address] [place], [place] [address]    Report Status  11/04/2017 FINAL  Final         Radiology Studies: Dg Chest 2 View  Result Date: 11/09/2017 CLINICAL DATA:  Shortness of Breath EXAM: CHEST - 2 VIEW COMPARISON:  11/07/2017 FINDINGS: The heart size and mediastinal contours are within normal limits. Both lungs are clear. The visualized skeletal structures are unremarkable. IMPRESSION: No active cardiopulmonary disease. Electronically Signed   By: Inez Catalina M.D.   On: 11/09/2017 16:36   US Scrotum  Result Date: 11/09/2017 CLINICAL DATA:  Testicular swelling since Saturday EXAM: SCROTAL ULTRASOUND DOPPLER ULTRASOUND OF THE TESTICLES TECHNIQUE: Complete ultrasound examination of the testicles, epididymis, and other scrotal structures was performed. Color and spectral Doppler ultrasound were also utilized to evaluate blood flow to the testicles. COMPARISON:  None. FINDINGS: Right testicle Measurements: 4.7 x 3.1 x 3.1 cm. No mass or microlithiasis visualized. Left testicle Measurements: 4.8 x 3.4 x 3.4 cm. No mass or microlithiasis visualized. Right epididymis:  Normal in size and appearance.  Mild hyperemia. Left epididymis:  Normal in size and appearance. Hydrocele:  None visualized. Varicocele:  None visualized. Pulsed Doppler interrogation of both testes demonstrates normal low resistance arterial and venous waveforms bilaterally. Additional comments: Diffuse scrotal wall thickening/edema. No discrete fluid collection/abscess. IMPRESSION: Normal sonographic appearance of the bilateral testes. No evidence of testicular torsion. Mild hyperemia of the right epididymis, raising the possibility of epididymitis, although this is equivocal. Diffuse scrotal wall thickening/edema, without evidence of scrotal wall abscess. Electronically Signed   By: Julian Hy M.D.   On: 11/09/2017 19:46   US Scrotum W/doppler  Result Date: 11/09/2017 CLINICAL DATA:  Testicular swelling since Saturday EXAM: SCROTAL ULTRASOUND DOPPLER ULTRASOUND OF THE TESTICLES  TECHNIQUE: Complete ultrasound examination of the testicles, epididymis, and other scrotal structures was performed. Color and spectral Doppler ultrasound were also utilized to evaluate blood flow to the testicles. COMPARISON:  None. FINDINGS: Right testicle Measurements: 4.7 x 3.1 x 3.1 cm. No mass or microlithiasis visualized. Left testicle Measurements: 4.8 x 3.4 x 3.4 cm. No mass or microlithiasis visualized. Right epididymis:  Normal in size and appearance.  Mild hyperemia. Left epididymis:  Normal in size and appearance. Hydrocele:  None visualized. Varicocele:  None visualized. Pulsed Doppler interrogation of both testes demonstrates normal low resistance arterial and venous waveforms bilaterally. Additional comments: Diffuse scrotal wall thickening/edema. No discrete fluid collection/abscess. IMPRESSION: Normal sonographic appearance of the bilateral testes. No evidence of testicular torsion. Mild hyperemia of the right epididymis, raising the possibility of epididymitis, although this is equivocal. Diffuse scrotal wall thickening/edema, without evidence of scrotal wall abscess. Electronically Signed   By: Julian Hy M.D.   On: 11/09/2017 19:46        Scheduled Meds: . atorvastatin  10 mg Oral QHS  . brimonidine  1 drop Both Eyes TID  . clindamycin  300 mg Oral Q6H  . cloNIDine  0.2 mg Oral BID  . folic acid  1 mg Oral Daily  . furosemide  40 mg Intravenous BID  . hydrALAZINE  50 mg Oral TID  . insulin aspart protamine- aspart  25 Units Subcutaneous BID WC  . metoprolol succinate  100 mg Oral Daily  . potassium chloride SA  40 mEq Oral BID  . sodium chloride flush  3 mL Intravenous Q12H  . thiamine  100 mg Oral Daily  . timolol  1 drop Both Eyes BID   Continuous Infusions: . sodium chloride       LOS: 0 days    Time spent: 35 minutes.     Elmarie Shiley, MD [facility] Pager [phone]  If 7PM-7AM, please contact night-coverage [email or web address] Password  [email or web address] 11/10/2017, 1:58 PM

## 2017-11-10 NOTE — Consult Note (Addendum)
[facility] Nurse wound consult note Pt is familiar to [facility] team from recent admission; refer to previous progress notes on 3/25, 3/26, 3/27.  Ortho service performed a consult on 3/25 and ordered bilat Profore compression wraps be applied and changed weekly.  Pt states he is due for follow-up with ortho service tomorrow at their office. His compression wraps were removed earlier by the bedside nurse to assess his feet and legs.   Reason for Consult: Bilat chronic necrotic wounds. Wound type: Full thickness wound to left outer heel; .3X.3X.3cm, dark reddish yellow woundbed, no odor, small amt yellow drainage Left anterior calf with dry scabbed area; 7X.5cm, no odor, drainage, or fluctuance. Right anterior to medial foot:  2cm x 7cm  unstageable with dry intact eschar, no odor, drainage, or fluctuance. Right lateral foot: 8cm x 3cm unstageable with dry intact eschar, no odor, drainage, or fluctuance. Dressing procedure/placement/frequency: Continue present plan of care with nonadherent dressing and 4 layer Profore compression wraps to BLE; pt states he will resume follow-up with ortho service for further plan of care after discharge. WOC will plan to change compression wraps next Tuesday if he is still in the hospital at that time.  Julien Girt MSN, RN, [place], [place], [place]

## 2017-11-11 ENCOUNTER — Ambulatory Visit: Payer: Medicare HMO | Admitting: Podiatry

## 2017-11-11 DIAGNOSIS — N183 Chronic kidney disease, stage 3 (moderate): Secondary | ICD-10-CM

## 2017-11-11 DIAGNOSIS — E1122 Type 2 diabetes mellitus with diabetic chronic kidney disease: Secondary | ICD-10-CM

## 2017-11-11 LAB — BASIC METABOLIC PANEL
Anion gap: 12 (ref 5–15)
BUN: 6 mg/dL (ref 6–20)
CO2: 25 mmol/L (ref 22–32)
Calcium: 7.7 mg/dL — ABNORMAL LOW (ref 8.9–10.3)
Chloride: 101 mmol/L (ref 101–111)
Creatinine, Ser: 1.46 mg/dL — ABNORMAL HIGH (ref 0.61–1.24)
GFR calc Af Amer: >60 mL/min (ref >60)
GFR calc non Af Amer: 56 mL/min — ABNORMAL LOW (ref >60)
Glucose, Bld: 124 mg/dL — ABNORMAL HIGH (ref 65–99)
Potassium: 3.3 mmol/L — ABNORMAL LOW (ref 3.5–5.1)
Sodium: 138 mmol/L (ref 135–145)

## 2017-11-11 LAB — GLUCOSE, CAPILLARY
Glucose-Capillary: 58 mg/dL — ABNORMAL LOW (ref 65–99)
Glucose-Capillary: 80 mg/dL (ref 65–99)
Glucose-Capillary: 157 mg/dL — ABNORMAL HIGH (ref 65–99)
Glucose-Capillary: 78 mg/dL (ref 65–99)

## 2017-11-11 LAB — CBC
HCT: 28 % — ABNORMAL LOW (ref 39.0–52.0)
Hemoglobin: 9.2 g/dL — ABNORMAL LOW (ref 13.0–17.0)
MCH: 30.3 pg (ref 26.0–34.0)
MCHC: 32.9 g/dL (ref 30.0–36.0)
MCV: 92.1 fL (ref 78.0–100.0)
Platelets: 174 10*3/uL (ref 150–400)
RBC: 3.04 MIL/uL — ABNORMAL LOW (ref 4.22–5.81)
RDW: 14.6 % (ref 11.5–15.5)
WBC: 6.1 10*3/uL (ref 4.0–10.5)

## 2017-11-11 MED ORDER — PANTOPRAZOLE SODIUM 40 MG PO TBEC
40.0000 mg | DELAYED_RELEASE_TABLET | Freq: Two times a day (BID) | ORAL | Status: DC
  Administered 2017-11-11 – 2017-11-12 (×2): 40 mg via ORAL
  Filled 2017-11-11 (×2): qty 1

## 2017-11-11 MED ORDER — INSULIN ASPART 100 UNIT/ML ~~LOC~~ SOLN: 0.0000 [IU] | Freq: Three times a day (TID) | SUBCUTANEOUS | Status: DC

## 2017-11-11 MED ORDER — INSULIN ASPART 100 UNIT/ML ~~LOC~~ SOLN
0.0000 [IU] | Freq: Every day | SUBCUTANEOUS | Status: DC
Start: 2017-11-11 — End: 2017-11-12

## 2017-11-11 MED ORDER — LORAZEPAM 0.5 MG PO TABS
0.5000 mg | ORAL_TABLET | Freq: Once | ORAL | Status: AC
  Administered 2017-11-12: 0.5 mg via ORAL
  Filled 2017-11-11: qty 1

## 2017-11-11 NOTE — Progress Notes (Signed)
Hypoglycemic Event  CBG: 58  Treatment: 15 GM carbohydrate snack  Symptoms: Nervous/irritable  Follow-up cbg VQQU:[id number] (pt wanted more time to eat more food) CBG Result:80  Possible Reasons for Event: Inadequate meal intake  Comments/MD notified:NP Thomasene Mohair, Gabriel Rainwater

## 2017-11-11 NOTE — Progress Notes (Signed)
PROGRESS NOTE  Timothy Clark:[id number] DOB: 31-Aug-1970 DOA: 11/09/2017 PCP: Rosalee Kaufman, PA-C   LOS: 1 day   Brief Narrative / Interim history: 47 year old male with diabetes, morbid obesity, he was recently hospitalized for septicemia cellulitis, has received a lot of fluids and has been third spacing, and was readmitted to the hospital with lower extremity and scrotal swelling.  He was placed on IV Lasix.  Assessment & Plan: Principal Problem:   Swelling Active Problems:   Essential hypertension   Chronic abdominal pain   CKD stage 3 due to type 2 diabetes mellitus (HCC)   Scrotal swelling   Anasarca, scrotal edema -Continue IV Lasix, per patient slightly improved -Scrotal ultrasound negative for acute findings  Right foot diabetic cellulitis -Recently admitted for sepsis, discharge 3/29 -Also is related to small vessel disease and neuropathy, had vascular evaluation last admission and arterial duplex showed no large vessel occlusion -Orthopedic surgery recommended conservative treatment with antibiotics, infectious disease was consulted and recommended clindamycin for 3 weeks  Chronic kidney disease stage III -Baseline creatinine around 1.3, currently stable at 1.4, continue Lasix and closely monitor  Hypokalemia -Due to Lasix, continue to monitor and replete as needed  Hypertension -Continue hydralazine, metoprolol, clonidine  Diabetes mellitus -Continue 70/30  Anemia of chronic disease -Hemoglobin stable   DVT prophylaxis: SCDs Code Status: Full code Family Communication: No family at bedside Disposition Plan: Home likely 1-2 days  Consultants:   None  Procedures:   None   Antimicrobials:  Clindamycin    Subjective: -Feeling better, still has scrotal swelling but somewhat improved today.  No chest pain, shortness of breath.  Objective: Vitals:   11/10/17 1520 11/10/17 2116 11/11/17 0454 11/11/17 1017  BP: 131/72 133/74 (!)  144/86 139/77  Pulse:  65 67   Resp:  19 18   Temp:  98.8 F (37.1 C) 98.7 F (37.1 C)   TempSrc:  Oral Oral   SpO2:  100% 100%   Weight:      Height:        Intake/Output Summary (Last 24 hours) at 11/11/2017 1239 Last data filed at 11/11/2017 0900 Gross per 24 hour  Intake 572 ml  Output 3250 ml  Net -2678 ml   Filed Weights   11/09/17 1306 11/10/17 0036  Weight: (!) 154.2 kg (340 lb) (!) 145.8 kg (321 lb 8 oz)    Examination:  Constitutional: NAD Eyes: lids and conjunctivae normal ENMT: Mucous membranes are moist. Respiratory: clear to auscultation bilaterally, no wheezing, no crackles.  Cardiovascular: Regular rate and rhythm, no murmurs / rubs / gallops. 1+ LE edema.  Bilateral lower extremities Ace wrapped Abdomen: no tenderness. Musculoskeletal: no clubbing / cyanosis.  Skin: no rashes Neurologic: Nonfocal Psychiatric: Normal judgment and insight. Alert and oriented x 3. Normal mood.    Data Reviewed: I have independently reviewed following labs and imaging studies   CBC: Recent Labs  Lab 11/07/17 1544 11/09/17 1308 11/10/17 0650 11/11/17 0610  WBC 8.6 8.0 6.5 6.1  NEUTROABS 6.6  --   --   --   HGB 9.7* 9.1* 8.9* 9.2*  HCT 29.4* 27.6* 27.8* 28.0*  MCV 91.0 90.8 91.7 92.1  PLT 175 181 172 355   Basic Metabolic Panel: Recent Labs  Lab 11/06/17 0500 11/07/17 1544 11/09/17 1308 11/10/17 0650 11/11/17 0610  NA 142 138 138 139 138  K 4.0 3.1* 3.4* 2.8* 3.3*  CL 111 105 106 104 101  CO2 23 24 24 27 25   GLUCOSE  83 148* 175* 193* 124*  BUN 7 5* 8 7 6   CREATININE 1.40* 1.31* 1.54* 1.45* 1.46*  CALCIUM 8.4* 8.0* 8.0* 7.7* 7.7*   GFR: Estimated Creatinine Clearance: 92.8 mL/min (A) (by C-G formula based on SCr of 1.46 mg/dL (H)). Liver Function Tests: Recent Labs  Lab 11/07/17 1544 11/10/17 0650  AST 30 14*  ALT 17 11*  ALKPHOS 86 58  BILITOT 0.6 0.5  PROT 5.1* 4.9*  ALBUMIN 2.0* 1.9*   No results for input(s): LIPASE, AMYLASE in the last  168 hours. No results for input(s): AMMONIA in the last 168 hours. Coagulation Profile: No results for input(s): INR, PROTIME in the last 168 hours. Cardiac Enzymes: No results for input(s): CKTOTAL, CKMB, CKMBINDEX, TROPONINI in the last 168 hours. BNP (last 3 results) No results for input(s): PROBNP in the last 8760 hours. HbA1C: No results for input(s): HGBA1C in the last 72 hours. CBG: Recent Labs  Lab 11/05/17 2131 11/06/17 0448 11/06/17 0753 11/11/17 0428 11/11/17 0451  GLUCAP 90 81 119* 58* 80   Lipid Profile: No results for input(s): CHOL, HDL, LDLCALC, TRIG, CHOLHDL, LDLDIRECT in the last 72 hours. Thyroid Function Tests: No results for input(s): TSH, T4TOTAL, FREET4, T3FREE, THYROIDAB in the last 72 hours. Anemia Panel: No results for input(s): VITAMINB12, FOLATE, FERRITIN, TIBC, IRON, RETICCTPCT in the last 72 hours. Urine analysis:    Component Value Date/Time   COLORURINE YELLOW 10/30/2017 2332   APPEARANCEUR HAZY (A) 10/30/2017 2332   LABSPEC 1.013 10/30/2017 2332   PHURINE 7.0 10/30/2017 2332   GLUCOSEU 150 (A) 10/30/2017 2332   HGBUR SMALL (A) 10/30/2017 2332   BILIRUBINUR NEGATIVE 10/30/2017 2332   KETONESUR 5 (A) 10/30/2017 2332   PROTEINUR >=300 (A) 10/30/2017 2332   UROBILINOGEN 0.2 06/07/2015 1353   NITRITE NEGATIVE 10/30/2017 2332   LEUKOCYTESUR NEGATIVE 10/30/2017 2332   Sepsis Labs: Invalid input(s): PROCALCITONIN, LACTICIDVEN  Recent Results (from the past 240 hour(s))  Urine Culture     Status: None   Collection Time: 11/03/17  1:42 AM  Result Value Ref Range Status   Specimen Description URINE, RANDOM  Final   Special Requests NONE  Final   Culture   Final    NO GROWTH Performed at [facility], [address], [place], [place] [address]    Report Status 11/04/2017 FINAL  Final      Radiology Studies: Dg Chest 2 View  Result Date: 11/09/2017 CLINICAL DATA:  Shortness of Breath EXAM: CHEST - 2 VIEW COMPARISON:  11/07/2017  FINDINGS: The heart size and mediastinal contours are within normal limits. Both lungs are clear. The visualized skeletal structures are unremarkable. IMPRESSION: No active cardiopulmonary disease. Electronically Signed   By: Inez Catalina M.D.   On: 11/09/2017 16:36   US Scrotum  Result Date: 11/09/2017 CLINICAL DATA:  Testicular swelling since Saturday EXAM: SCROTAL ULTRASOUND DOPPLER ULTRASOUND OF THE TESTICLES TECHNIQUE: Complete ultrasound examination of the testicles, epididymis, and other scrotal structures was performed. Color and spectral Doppler ultrasound were also utilized to evaluate blood flow to the testicles. COMPARISON:  None. FINDINGS: Right testicle Measurements: 4.7 x 3.1 x 3.1 cm. No mass or microlithiasis visualized. Left testicle Measurements: 4.8 x 3.4 x 3.4 cm. No mass or microlithiasis visualized. Right epididymis:  Normal in size and appearance.  Mild hyperemia. Left epididymis:  Normal in size and appearance. Hydrocele:  None visualized. Varicocele:  None visualized. Pulsed Doppler interrogation of both testes demonstrates normal low resistance arterial and venous waveforms bilaterally.  Additional comments: Diffuse scrotal wall thickening/edema. No discrete fluid collection/abscess. IMPRESSION: Normal sonographic appearance of the bilateral testes. No evidence of testicular torsion. Mild hyperemia of the right epididymis, raising the possibility of epididymitis, although this is equivocal. Diffuse scrotal wall thickening/edema, without evidence of scrotal wall abscess. Electronically Signed   By: Julian Hy M.D.   On: 11/09/2017 19:46   US Scrotum W/doppler  Result Date: 11/09/2017 CLINICAL DATA:  Testicular swelling since Saturday EXAM: SCROTAL ULTRASOUND DOPPLER ULTRASOUND OF THE TESTICLES TECHNIQUE: Complete ultrasound examination of the testicles, epididymis, and other scrotal structures was performed. Color and spectral Doppler ultrasound were also utilized to evaluate  blood flow to the testicles. COMPARISON:  None. FINDINGS: Right testicle Measurements: 4.7 x 3.1 x 3.1 cm. No mass or microlithiasis visualized. Left testicle Measurements: 4.8 x 3.4 x 3.4 cm. No mass or microlithiasis visualized. Right epididymis:  Normal in size and appearance.  Mild hyperemia. Left epididymis:  Normal in size and appearance. Hydrocele:  None visualized. Varicocele:  None visualized. Pulsed Doppler interrogation of both testes demonstrates normal low resistance arterial and venous waveforms bilaterally. Additional comments: Diffuse scrotal wall thickening/edema. No discrete fluid collection/abscess. IMPRESSION: Normal sonographic appearance of the bilateral testes. No evidence of testicular torsion. Mild hyperemia of the right epididymis, raising the possibility of epididymitis, although this is equivocal. Diffuse scrotal wall thickening/edema, without evidence of scrotal wall abscess. Electronically Signed   By: Julian Hy M.D.   On: 11/09/2017 19:46     Scheduled Meds: . atorvastatin  10 mg Oral QHS  . brimonidine  1 drop Both Eyes TID  . clindamycin  300 mg Oral Q6H  . cloNIDine  0.2 mg Oral BID  . folic acid  1 mg Oral Daily  . furosemide  40 mg Intravenous BID  . hydrALAZINE  50 mg Oral TID  . insulin aspart protamine- aspart  25 Units Subcutaneous BID WC  . metoprolol succinate  100 mg Oral Daily  . potassium chloride SA  40 mEq Oral BID  . sodium chloride flush  3 mL Intravenous Q12H  . thiamine  100 mg Oral Daily  . timolol  1 drop Both Eyes BID   Continuous Infusions: . sodium chloride      Marzetta Board, MD, PhD [facility] Pager [phone]  If 7PM-7AM, please contact night-coverage [email or web address] Password [facility] 11/11/2017, 12:39 PM

## 2017-11-12 LAB — CBC
HCT: 27.6 % — ABNORMAL LOW (ref 39.0–52.0)
Hemoglobin: 9.1 g/dL — ABNORMAL LOW (ref 13.0–17.0)
MCH: 30.4 pg (ref 26.0–34.0)
MCHC: 33 g/dL (ref 30.0–36.0)
MCV: 92.3 fL (ref 78.0–100.0)
Platelets: 218 10*3/uL (ref 150–400)
RBC: 2.99 MIL/uL — ABNORMAL LOW (ref 4.22–5.81)
RDW: 14.4 % (ref 11.5–15.5)
WBC: 7.3 10*3/uL (ref 4.0–10.5)

## 2017-11-12 LAB — BASIC METABOLIC PANEL
Anion gap: 7 (ref 5–15)
BUN: 5 mg/dL — ABNORMAL LOW (ref 6–20)
CO2: 29 mmol/L (ref 22–32)
Calcium: 8 mg/dL — ABNORMAL LOW (ref 8.9–10.3)
Chloride: 103 mmol/L (ref 101–111)
Creatinine, Ser: 1.44 mg/dL — ABNORMAL HIGH (ref 0.61–1.24)
GFR calc Af Amer: >60 mL/min (ref >60)
GFR calc non Af Amer: 57 mL/min — ABNORMAL LOW (ref >60)
Glucose, Bld: 78 mg/dL (ref 65–99)
Potassium: 3.1 mmol/L — ABNORMAL LOW (ref 3.5–5.1)
Sodium: 139 mmol/L (ref 135–145)

## 2017-11-12 LAB — GLUCOSE, CAPILLARY
Glucose-Capillary: 111 mg/dL — ABNORMAL HIGH (ref 65–99)
Glucose-Capillary: 86 mg/dL (ref 65–99)

## 2017-11-12 MED ORDER — FUROSEMIDE 40 MG PO TABS: 40.0000 mg | ORAL_TABLET | Freq: Two times a day (BID) | ORAL | 0 refills | Status: DC

## 2017-11-12 MED ORDER — METOCLOPRAMIDE HCL 5 MG/ML IJ SOLN
10.0000 mg | Freq: Three times a day (TID) | INTRAMUSCULAR | Status: DC
  Administered 2017-11-12: 10 mg via INTRAVENOUS
  Filled 2017-11-12: qty 2

## 2017-11-12 MED ORDER — POTASSIUM CHLORIDE CRYS ER 20 MEQ PO TBCR: 20.0000 meq | EXTENDED_RELEASE_TABLET | Freq: Two times a day (BID) | ORAL | 0 refills | Status: DC

## 2017-11-12 NOTE — Care Management Note (Signed)
Case Management Note  Patient Details  Name: Timothy Clark MRN: [id number] Date of Birth: 24-Dec-1970  Subjective/Objective:                 Patient to DC today w resumption of HH through [facility]. No other CM needs. Referral faxed to [phone], accepted by [facility].    Action/Plan:   Expected Discharge Date:  11/12/17               Expected Discharge Plan:  [place]  In-House Referral:     Discharge planning Services  CM Consult  Post Acute Care Choice:  Resumption of Svcs/PTA Provider Choice offered to:     DME Arranged:    DME Agency:     HH Arranged:  RN, PT [place] Agency:  [facility] [phone])  Status of Service:  Completed, signed off  If discussed at [place] of Stay Meetings, dates discussed:    Additional Comments:  Carles Collet, RN 11/12/2017, 11:25 AM

## 2017-11-12 NOTE — Progress Notes (Signed)
[name] to be D/C'd Home per MD order.  Discussed with the patient and all questions fully answered.  VSS, Skin clean, dry and intact without evidence of skin break down, no evidence of skin tears noted. IV catheter discontinued intact. Site without signs and symptoms of complications. Dressing and pressure applied.  An After Visit Summary was printed and given to the patient. Patient received prescription.  D/c education completed with patient/family including follow up instructions, medication list, d/c activities limitations if indicated, with other d/c instructions as indicated by MD - patient able to verbalize understanding, all questions fully answered.   Patient instructed to return to ED, call 911, or call MD for any changes in condition.   Patient escorted via [place], and D/C home via private auto.  Luci Bank 11/12/2017 12:00 PM

## 2017-11-12 NOTE — Discharge Summary (Signed)
Physician Discharge Summary  MOROCCO GIPE FYB:[id number] DOB: 1971-03-29 DOA: 11/09/2017  PCP: Rosalee Kaufman, PA-C  Admit date: 11/09/2017 Discharge date: 11/12/2017  Admitted From: home Disposition:  home  Recommendations for Outpatient Follow-up:  1. Follow up with PCP in 1-2 weeks 2. Continue Lasix BID on d/c   Home Health: RN Equipment/Devices: none  Discharge Condition: stable CODE STATUS: Full code Diet recommendation: low salt  HPI: Per Dr. Shanon Brow, COSTON MANDATO is a 47 y.o. male with medical history significant of diabetes, morbid obesity, recent hospitalization for sepsis/cellulitis received a lot of fluids and has since been third spacing.  Patient was discharged home on 11/06/2017 on a course of clindamycin and Lasix.  Patient went home and he was still having a significant amount of swelling diffusely particularly in his scrotum and his legs.  He has been getting his legs Ace wrapped and that has improved however his scrotal swelling has not gone down.  Patient came the emergency department on 11/07/2017 was given an extra dose of Lasix.  He has been taking Lasix 40 mg p.o. daily at home.  Patient came back in today because it is not any better yet.  Patient being referred for admission to diurese more to help with his general anasarca.  Patient denies any chest pain or shortness of breath or fevers or cough.  Hospital Course: Anasarca, scrotal edema -patient was admitted to the hospital and placed on IV Lasix, his edema improved, he will be transitioned to p.o. Lasix and was discharged home in stable condition.  He was strongly advised regarding a low-salt diet as an essential part along with the diuretics.  He seems to have overall poor understanding despite extensive bedside education. Scrotal ultrasound negative for acute findings Right foot diabetic cellulitis -Recently admitted for sepsis, discharged 3/29. Orthopedic surgery recommended conservative treatment with  antibiotics, infectious disease was consulted and recommended clindamycin for 3 weeks, continue clindamycin on discharge as per original plan.  Home health RN is following for his wounds Chronic kidney disease stage III -Baseline creatinine around 1.3, currently stable at 1.4, continue Lasix and monitor as an outpatient Hypokalemia -due to lasix, will give K supplementation at home while on Lasix  Hypertension -Continue hydralazine, metoprolol, clonidine Diabetes mellitus -Continue 70/30 Anemia of chronic disease -Hemoglobin stable   Discharge Diagnoses:  Principal Problem:   Swelling Active Problems:   Essential hypertension   Chronic abdominal pain   CKD stage 3 due to type 2 diabetes mellitus (HCC)   Scrotal swelling     Discharge Instructions   Allergies as of 11/12/2017      Reactions   Donnatal [pb-hyoscy-atropine-scopolamine] Anaphylaxis   Reaction to GI cocktail   Fish Allergy Anaphylaxis, Hives, Rash   Haldol [haloperidol Lactate] Other (See Comments)   Chest Pain   Lidocaine Anaphylaxis   Reaction to GI cocktail   Maalox [calcium Carbonate Antacid] Anaphylaxis   Reaction to GI cocktail   Lisinopril Swelling   Aspirin Hives, Itching   Bactrim [sulfamethoxazole-trimethoprim] Itching   Doxycycline Hives, Nausea And Vomiting   Severe    Keflex [cephalexin] Hives, Itching, Nausea And Vomiting   Penicillins Hives, Itching   Has patient had a PCN reaction causing immediate rash, facial/tongue/throat swelling, SOB or lightheadedness with hypotension: yes Has patient had a PCN reaction causing severe rash involving mucus membranes or skin necrosis: yes Has patient had a PCN reaction that required hospitalization no Has patient had a PCN reaction occurring within the last 10  years: yes If all of the above answers are "NO", then may proceed with Cephalospor   Phenergan [promethazine Hcl] Nausea And Vomiting      Medication List    TAKE these medications     acetaminophen 325 MG tablet Commonly known as:  TYLENOL Take 2 tablets (650 mg total) by mouth every 6 (six) hours as needed for mild pain (or Fever >/= 101).   atorvastatin 10 MG tablet Commonly known as:  LIPITOR Take 10 mg by mouth at bedtime.   brimonidine 0.15 % ophthalmic solution Commonly known as:  ALPHAGAN Place 1 drop into both eyes 3 (three) times daily.   cetirizine 10 MG tablet Commonly known as:  ZYRTEC Take 10 mg by mouth at bedtime.   clindamycin 300 MG capsule Commonly known as:  CLEOCIN Take 1 capsule (300 mg total) by mouth every 6 (six) hours for 21 days.   cloNIDine 0.2 MG tablet Commonly known as:  CATAPRES Take 1 tablet (0.2 mg total) by mouth 2 (two) times daily.   dicyclomine 10 MG capsule Commonly known as:  BENTYL TAKE 1 CAPSULE BY MOUTH 4 TIMES DAILY BEFORE MEAL(S) AND AT BEDTIME What changed:  See the new instructions.   folic acid 1 MG tablet Commonly known as:  FOLVITE Take 1 tablet (1 mg total) by mouth daily.   furosemide 40 MG tablet Commonly known as:  LASIX Take 1 tablet (40 mg total) by mouth 2 (two) times daily. What changed:  when to take this   hydrALAZINE 50 MG tablet Commonly known as:  APRESOLINE Take 1 tablet (50 mg total) by mouth 3 (three) times daily.   insulin aspart protamine- aspart (70-30) 100 UNIT/ML injection Commonly known as:  NOVOLOG MIX 70/30 Inject 0.25 mLs (25 Units total) into the skin 2 (two) times daily with a meal. What changed:  how much to take   metoprolol succinate 100 MG 24 hr tablet Commonly known as:  TOPROL-XL Take 1 tablet (100 mg total) by mouth daily. Take with or immediately following a meal.   potassium chloride SA 20 MEQ tablet Commonly known as:  K-DUR,KLOR-CON Take 1 tablet (20 mEq total) by mouth 2 (two) times daily. What changed:  how much to take   sitaGLIPtin 100 MG tablet Commonly known as:  JANUVIA Take 100 mg by mouth daily.   thiamine 100 MG tablet Take 1 tablet (100  mg total) by mouth daily.   timolol 0.5 % ophthalmic solution Commonly known as:  TIMOPTIC Place 1 drop into both eyes 2 (two) times daily.      Consultations:  None   Procedures/Studies:  Dg Chest 2 View  Result Date: 11/09/2017 CLINICAL DATA:  Shortness of Breath EXAM: CHEST - 2 VIEW COMPARISON:  11/07/2017 FINDINGS: The heart size and mediastinal contours are within normal limits. Both lungs are clear. The visualized skeletal structures are unremarkable. IMPRESSION: No active cardiopulmonary disease. Electronically Signed   By: Inez Catalina M.D.   On: 11/09/2017 16:36   Dg Chest 2 View  Result Date: 11/07/2017 CLINICAL DATA:  Hypertension. EXAM: CHEST - 2 VIEW COMPARISON:  Radiographs of October 30, 2017. FINDINGS: The heart size and mediastinal contours are within normal limits. Both lungs are clear. Hypoinflation of the lungs are noted. No pneumothorax or pleural effusion is noted. The visualized skeletal structures are unremarkable. IMPRESSION: No active cardiopulmonary disease.  Hypoinflation of the lungs. Electronically Signed   By: Marijo Conception, M.D.   On: 11/07/2017 16:21  Dg Chest 2 View  Result Date: 10/30/2017 CLINICAL DATA:  Altered mental status with fever EXAM: CHEST - 2 VIEW COMPARISON:  08/31/2017 FINDINGS: Low lung volumes. No consolidation or effusion. Limited lateral view due to overlying soft tissues. Cardiomediastinal silhouette within normal limits. No pneumothorax. IMPRESSION: Low lung volumes.  No focal pulmonary infiltrate. Electronically Signed   By: Donavan Foil M.D.   On: 10/30/2017 21:00   US Scrotum  Result Date: 11/09/2017 CLINICAL DATA:  Testicular swelling since Saturday EXAM: SCROTAL ULTRASOUND DOPPLER ULTRASOUND OF THE TESTICLES TECHNIQUE: Complete ultrasound examination of the testicles, epididymis, and other scrotal structures was performed. Color and spectral Doppler ultrasound were also utilized to evaluate blood flow to the testicles. COMPARISON:   None. FINDINGS: Right testicle Measurements: 4.7 x 3.1 x 3.1 cm. No mass or microlithiasis visualized. Left testicle Measurements: 4.8 x 3.4 x 3.4 cm. No mass or microlithiasis visualized. Right epididymis:  Normal in size and appearance.  Mild hyperemia. Left epididymis:  Normal in size and appearance. Hydrocele:  None visualized. Varicocele:  None visualized. Pulsed Doppler interrogation of both testes demonstrates normal low resistance arterial and venous waveforms bilaterally. Additional comments: Diffuse scrotal wall thickening/edema. No discrete fluid collection/abscess. IMPRESSION: Normal sonographic appearance of the bilateral testes. No evidence of testicular torsion. Mild hyperemia of the right epididymis, raising the possibility of epididymitis, although this is equivocal. Diffuse scrotal wall thickening/edema, without evidence of scrotal wall abscess. Electronically Signed   By: Julian Hy M.D.   On: 11/09/2017 19:46   Dg Shoulder Right Port  Result Date: 10/30/2017 CLINICAL DATA:  Arm pain EXAM: PORTABLE RIGHT SHOULDER COMPARISON:  Chest x-ray 10/30/2017, 08/31/2017 FINDINGS: Limited by positioning. No fracture seen. Slight anterior positioning of the humeral head with respect to glenoid fossa but suspect that this may be related to positioning. Right lung apex clear IMPRESSION: 1. No fracture seen 2. Slight anterior positioning of the humeral head with respect to the glenoid fossa, suspect that this may be related to positioning. Electronically Signed   By: Donavan Foil M.D.   On: 10/30/2017 23:25   Dg Foot 2 Views Right  Result Date: 10/30/2017 CLINICAL DATA:  Chronic wound to the right foot EXAM: RIGHT FOOT - 2 VIEW COMPARISON:  10/12/2017, 08/26/2017, 07/14/2017 FINDINGS: No acute fracture is seen. Patient is status post amputation of first digit at the level of the mid first metatarsal. Increased bony proliferative changes about the cut margin of the first metatarsal with  indistinct appearance of the cortex on the medial side. Vascular calcifications. No gas in the soft tissues IMPRESSION: Status post amputation of first digit at the level of the mid first metatarsal. Indistinct appearance of the medial cortex of the shaft of the residual metatarsal is concerning for ongoing osteomyelitis. Electronically Signed   By: Donavan Foil M.D.   On: 10/30/2017 21:05   US Scrotum W/doppler  Result Date: 11/09/2017 CLINICAL DATA:  Testicular swelling since Saturday EXAM: SCROTAL ULTRASOUND DOPPLER ULTRASOUND OF THE TESTICLES TECHNIQUE: Complete ultrasound examination of the testicles, epididymis, and other scrotal structures was performed. Color and spectral Doppler ultrasound were also utilized to evaluate blood flow to the testicles. COMPARISON:  None. FINDINGS: Right testicle Measurements: 4.7 x 3.1 x 3.1 cm. No mass or microlithiasis visualized. Left testicle Measurements: 4.8 x 3.4 x 3.4 cm. No mass or microlithiasis visualized. Right epididymis:  Normal in size and appearance.  Mild hyperemia. Left epididymis:  Normal in size and appearance. Hydrocele:  None visualized. Varicocele:  None visualized.  Pulsed Doppler interrogation of both testes demonstrates normal low resistance arterial and venous waveforms bilaterally. Additional comments: Diffuse scrotal wall thickening/edema. No discrete fluid collection/abscess. IMPRESSION: Normal sonographic appearance of the bilateral testes. No evidence of testicular torsion. Mild hyperemia of the right epididymis, raising the possibility of epididymitis, although this is equivocal. Diffuse scrotal wall thickening/edema, without evidence of scrotal wall abscess. Electronically Signed   By: Julian Hy M.D.   On: 11/09/2017 19:46   US Abdomen Limited Ruq  Result Date: 10/31/2017 CLINICAL DATA:  Initial evaluation for acute right upper quadrant pain EXAM: ULTRASOUND ABDOMEN LIMITED RIGHT UPPER QUADRANT COMPARISON:  Prior CT from  09/15/2017. FINDINGS: Gallbladder: No gallstones or wall thickening visualized. No sonographic Murphy sign noted by sonographer. Common bile duct: Diameter: 3.7 mm Liver: No focal lesion identified. Within normal limits in parenchymal echogenicity. Portal vein is patent on color Doppler imaging with normal direction of blood flow towards the liver. IMPRESSION: Normal right upper quadrant ultrasound. Electronically Signed   By: Jeannine Boga M.D.   On: 10/31/2017 04:25     Subjective: - no chest pain, shortness of breath, no abdominal pain, nausea or vomiting.   Discharge Exam: Vitals:   11/12/17 0446 11/12/17 0843  BP: (!) 156/92 (!) 162/90  Pulse: 66 70  Resp: 18   Temp: 98.3 F (36.8 C)   SpO2: 99%     General: Pt is alert, awake, not in acute distress Cardiovascular: RRR, S1/S2 +, no rubs, no gallops Respiratory: CTA bilaterally, no wheezing, no rhonchi Abdominal: Soft, NT, ND, bowel sounds + Extremities: no edema, no cyanosis    The results of significant diagnostics from this hospitalization (including imaging, microbiology, ancillary and laboratory) are listed below for reference.     Microbiology: Recent Results (from the past 240 hour(s))  Urine Culture     Status: None   Collection Time: 11/03/17  1:42 AM  Result Value Ref Range Status   Specimen Description URINE, RANDOM  Final   Special Requests NONE  Final   Culture   Final    NO GROWTH Performed at [facility], [address] [place], [place] [address]    Report Status 11/04/2017 FINAL  Final     Labs: BNP (last 3 results) Recent Labs    11/09/17 1722  BNP 867.6*   Basic Metabolic Panel: Recent Labs  Lab 11/07/17 1544 11/09/17 1308 11/10/17 0650 11/11/17 0610 11/12/17 0421  NA 138 138 139 138 139  K 3.1* 3.4* 2.8* 3.3* 3.1*  CL 105 106 104 101 103  CO2 24 24 27 25 29   GLUCOSE 148* 175* 193* 124* 78  BUN 5* 8 7 6  5*  CREATININE 1.31* 1.54* 1.45* 1.46* 1.44*  CALCIUM 8.0* 8.0*  7.7* 7.7* 8.0*   Liver Function Tests: Recent Labs  Lab 11/07/17 1544 11/10/17 0650  AST 30 14*  ALT 17 11*  ALKPHOS 86 58  BILITOT 0.6 0.5  PROT 5.1* 4.9*  ALBUMIN 2.0* 1.9*   No results for input(s): LIPASE, AMYLASE in the last 168 hours. No results for input(s): AMMONIA in the last 168 hours. CBC: Recent Labs  Lab 11/07/17 1544 11/09/17 1308 11/10/17 0650 11/11/17 0610 11/12/17 0421  WBC 8.6 8.0 6.5 6.1 7.3  NEUTROABS 6.6  --   --   --   --   HGB 9.7* 9.1* 8.9* 9.2* 9.1*  HCT 29.4* 27.6* 27.8* 28.0* 27.6*  MCV 91.0 90.8 91.7 92.1 92.3  PLT 175 181 172 174 218  Cardiac Enzymes: No results for input(s): CKTOTAL, CKMB, CKMBINDEX, TROPONINI in the last 168 hours. BNP: Invalid input(s): POCBNP CBG: Recent Labs  Lab 11/11/17 0451 11/11/17 1729 11/11/17 2139 11/11/17 2347 11/12/17 0748  GLUCAP 80 157* 78 86 111*   D-Dimer No results for input(s): DDIMER in the last 72 hours. Hgb A1c No results for input(s): HGBA1C in the last 72 hours. Lipid Profile No results for input(s): CHOL, HDL, LDLCALC, TRIG, CHOLHDL, LDLDIRECT in the last 72 hours. Thyroid function studies No results for input(s): TSH, T4TOTAL, T3FREE, THYROIDAB in the last 72 hours.  Invalid input(s): FREET3 Anemia work up No results for input(s): VITAMINB12, FOLATE, FERRITIN, TIBC, IRON, RETICCTPCT in the last 72 hours. Urinalysis    Component Value Date/Time   COLORURINE YELLOW 10/30/2017 2332   APPEARANCEUR HAZY (A) 10/30/2017 2332   LABSPEC 1.013 10/30/2017 2332   PHURINE 7.0 10/30/2017 2332   GLUCOSEU 150 (A) 10/30/2017 2332   HGBUR SMALL (A) 10/30/2017 2332   BILIRUBINUR NEGATIVE 10/30/2017 2332   KETONESUR 5 (A) 10/30/2017 2332   PROTEINUR >=300 (A) 10/30/2017 2332   UROBILINOGEN 0.2 06/07/2015 1353   NITRITE NEGATIVE 10/30/2017 2332   LEUKOCYTESUR NEGATIVE 10/30/2017 2332   Sepsis Labs Invalid input(s): PROCALCITONIN,  WBC,  LACTICIDVEN   Time coordinating discharge: 45  minutes  SIGNED:  Marzetta Board, MD  [facility] 11/12/2017, 4:31 PM Pager [phone]  If 7PM-7AM, please contact night-coverage [email or web address] Password [email or web address]

## 2017-11-13 DIAGNOSIS — R6520 Severe sepsis without septic shock: Secondary | ICD-10-CM | POA: Diagnosis not present

## 2017-11-13 DIAGNOSIS — E11621 Type 2 diabetes mellitus with foot ulcer: Secondary | ICD-10-CM | POA: Diagnosis not present

## 2017-11-13 DIAGNOSIS — R451 Restlessness and agitation: Secondary | ICD-10-CM | POA: Diagnosis not present

## 2017-11-13 DIAGNOSIS — K3184 Gastroparesis: Secondary | ICD-10-CM | POA: Diagnosis not present

## 2017-11-13 DIAGNOSIS — I10 Essential (primary) hypertension: Secondary | ICD-10-CM | POA: Diagnosis not present

## 2017-11-13 DIAGNOSIS — K219 Gastro-esophageal reflux disease without esophagitis: Secondary | ICD-10-CM | POA: Diagnosis not present

## 2017-11-16 ENCOUNTER — Ambulatory Visit: Payer: Medicare HMO | Admitting: Podiatry

## 2017-11-16 DIAGNOSIS — I10 Essential (primary) hypertension: Secondary | ICD-10-CM | POA: Diagnosis not present

## 2017-11-16 DIAGNOSIS — K3184 Gastroparesis: Secondary | ICD-10-CM | POA: Diagnosis not present

## 2017-11-16 DIAGNOSIS — E11621 Type 2 diabetes mellitus with foot ulcer: Secondary | ICD-10-CM | POA: Diagnosis not present

## 2017-11-16 DIAGNOSIS — K219 Gastro-esophageal reflux disease without esophagitis: Secondary | ICD-10-CM | POA: Diagnosis not present

## 2017-11-16 DIAGNOSIS — R6520 Severe sepsis without septic shock: Secondary | ICD-10-CM | POA: Diagnosis not present

## 2017-11-16 DIAGNOSIS — E0842 Diabetes mellitus due to underlying condition with diabetic polyneuropathy: Secondary | ICD-10-CM

## 2017-11-16 DIAGNOSIS — I70235 Atherosclerosis of native arteries of right leg with ulceration of other part of foot: Secondary | ICD-10-CM

## 2017-11-16 DIAGNOSIS — L97512 Non-pressure chronic ulcer of other part of right foot with fat layer exposed: Secondary | ICD-10-CM

## 2017-11-16 DIAGNOSIS — R451 Restlessness and agitation: Secondary | ICD-10-CM | POA: Diagnosis not present

## 2017-11-17 ENCOUNTER — Encounter: Payer: Self-pay | Admitting: Infectious Diseases

## 2017-11-17 ENCOUNTER — Ambulatory Visit: Payer: Medicare HMO | Admitting: Infectious Diseases

## 2017-11-17 ENCOUNTER — Telehealth: Payer: Self-pay | Admitting: *Deleted

## 2017-11-17 VITALS — BP 161/94 | HR 70 | Temp 98.6°F | Ht 72.0 in | Wt 298.0 lb

## 2017-11-17 DIAGNOSIS — E11628 Type 2 diabetes mellitus with other skin complications: Secondary | ICD-10-CM | POA: Diagnosis not present

## 2017-11-17 DIAGNOSIS — I10 Essential (primary) hypertension: Secondary | ICD-10-CM

## 2017-11-17 DIAGNOSIS — R609 Edema, unspecified: Secondary | ICD-10-CM | POA: Diagnosis not present

## 2017-11-17 DIAGNOSIS — L089 Local infection of the skin and subcutaneous tissue, unspecified: Secondary | ICD-10-CM | POA: Diagnosis not present

## 2017-11-17 DIAGNOSIS — R234 Changes in skin texture: Secondary | ICD-10-CM

## 2017-11-17 NOTE — Telephone Encounter (Signed)
Left message for pt to call with [place] agency. I reviewed clinicals of 2018 and pt was established with [facility].

## 2017-11-17 NOTE — Telephone Encounter (Addendum)
I spoke with Timothy Clark and she states pt is no longer established with them, but would accept new orders to begin care again if criteria is met. Faxed required form, clinical 10/12/2017 with statement 11/16/2017 clinical would be faxed once available to [facility].

## 2017-11-17 NOTE — Telephone Encounter (Signed)
-----   Message from Edrick Kins, DPM sent at 11/16/2017  4:34 PM EDT ----- Regarding: Home health dressing changes Patient is currently having a nurse come to the home 2 times per week.  Please modify orders for home health dressing changes.  -Cleanse wounds with normal saline.  Dry. -Apply Betadine soaked gauze wet-to-dry 4x4  -Dressed with large Kerlix and coflex  Dx: Ulcer with dry eschar right foot x 2. Ulcer w/ overlying eschar left heel lateral secondary to diabetes mellitus  Thanks, Dr. Amalia Hailey

## 2017-11-17 NOTE — Progress Notes (Signed)
Timothy Clark 1971/07/19 [id number] PCP: Timothy Kaufman, PA-C   Patient Active Problem List   Diagnosis Date Noted  . Wound eschars of foot, right  10/30/2017    Priority: High  . Diabetic infection of left heel ([place]) 02/14/2010    Priority: High  . Diabetes mellitus type 2 with complications, uncontrolled ([place]) 03/22/2009    Priority: Medium  . Scrotal swelling 11/10/2017  . Swelling 11/09/2017  . Scrotal edema   . Obesity, Class III, BMI 40-49.9 (morbid obesity) ([place]) 11/02/2017  . CKD stage 3 due to type 2 diabetes mellitus ([place]) 10/30/2017  . Chronic abdominal pain 09/21/2017  . Prolonged QT interval 09/21/2017  . GERD (gastroesophageal reflux disease) 07/14/2017  . Glaucoma 07/14/2017  . Anemia 07/14/2017  . Pressure injury of skin 02/01/2017  . Insulin dependent diabetes mellitus ([place]) 09/07/2016  . Orthostatic syncope 09/05/2016  . 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 Gastroparesis due to DM ([place]) 02/06/2015  . Essential hypertension 12/06/2014  . UNSPECIFIED PERIPHERAL VASCULAR DISEASE 10/19/2009  . ERECTILE DYSFUNCTION, ORGANIC 10/19/2009  . NUMBNESS 07/20/2009  . Hyperlipidemia 03/22/2009  . Depression 03/22/2009    Brief Narrative:  Timothy Clark is a 47 y.o. male diabetic patient that has had chronic ulcerations to the right foot and previously osteomyelitis to the right forefoot/MTP#1 now s/p amputation of this site. Presented to [facility] after sustaining pressure wounds to the right medial and lateral foot due to tight ACE wrap - he is insensate and was unable to detect the ischemic damage he sustained. Also has a new wound on the left lateral heel. He was treated with IV Aztreonam, Vancomycin and Flagyl PO and transitioned to Clindamycin PO QID as he was unable to afford Ertapenem and not able to accommodate more frequent IV administrations.    HPI:  Interval history noted for observational stay at [facility] d/t uncontrolled edema/swelling. He is  doing much better and continues on Lasix BID. Profore wraps have been removed due to swelling and per Dr. Rebekah Chesterfield instructions will continue with twice a week dressing changes with betadine and kerlix wraps. He has done very well tolerating Clindamycin and has only had one day of diarrhea but that has resolved. Left foot was debrided yesterday and only had a little yellow drainage after that. Denies fevers, chills or drainage from right foot. He has been using recommended orthopedic boot on the right foot.  Swelling is improved overall and hopeful he continues to keep this under control.   Review of Systems  Constitutional: Positive for weight loss (20 lb due to diuresis). Negative for chills, fever and malaise/fatigue.  HENT: Negative for sore throat.   Eyes:       Legally blind  Respiratory: Negative for cough and sputum production.   Cardiovascular: Positive for leg swelling. Negative for chest pain.  Gastrointestinal: Negative for abdominal pain, diarrhea (once) and vomiting.  Genitourinary: Negative for dysuria and flank pain.  Musculoskeletal: Negative for joint pain, myalgias and neck pain.  Skin: Negative for rash.  Neurological: Negative for dizziness, tingling and headaches.  Psychiatric/Behavioral: Negative for depression and substance abuse. The patient is not nervous/anxious and does not have insomnia.     Past Medical History:  Diagnosis Date  . Chronic abdominal pain   . Esophagitis   . Eye hemorrhage   . Gastritis   . Gastroparesis   . GERD (gastroesophageal reflux disease)   . Glaucoma   . Helicobacter pylori (H. pylori) infection 2016   ERADICATION DOCUMENTED  VIA STOOL TEST in AUG 2016 at [place]  . Hiccups   . Hypertension   . Nausea and vomiting    chronic, recurrent  . Type 2 diabetes mellitus with complications (HCC)    diagnosed around age 37    Allergies  Allergen Reactions  . Donnatal [Pb-Hyoscy-Atropine-Scopolamine] Anaphylaxis    Reaction to GI  cocktail  . Fish Allergy Anaphylaxis, Hives and Rash  . Haldol [Haloperidol Lactate] Other (See Comments)    Chest Pain  . Lidocaine Anaphylaxis    Reaction to GI cocktail  . Maalox [Calcium Carbonate Antacid] Anaphylaxis    Reaction to GI cocktail  . Lisinopril Swelling  . Aspirin Hives and Itching  . Bactrim [Sulfamethoxazole-Trimethoprim] Itching  . Doxycycline Hives and Nausea And Vomiting    Severe   . Keflex [Cephalexin] Hives, Itching and Nausea And Vomiting  . Penicillins Hives and Itching    Has patient had a PCN reaction causing immediate rash, facial/tongue/throat swelling, SOB or lightheadedness with hypotension: yes Has patient had a PCN reaction causing severe rash involving mucus membranes or skin necrosis: yes Has patient had a PCN reaction that required hospitalization no Has patient had a PCN reaction occurring within the last 10 years: yes If all of the above answers are "NO", then may proceed with Cephalospor  . Phenergan [Promethazine Hcl] Nausea And Vomiting    OBJECTIVE: Vitals:   11/17/17 1114  BP: (!) 161/94  Pulse: 70  Temp: 98.6 F (37 C)  TempSrc: Oral  Weight: 298 lb (135.2 kg)  Height: 6' (1.829 m)   Body mass index is 40.42 kg/m.  Physical Exam  Constitutional: He is oriented to person, place, and time and well-developed, well-nourished, and in no distress.  Seated comfortably in chair with his nephew present.   HENT:  Mouth/Throat: Oropharynx is clear and moist.  Eyes: No scleral icterus.  Right eye with outward gaze   Cardiovascular: Normal rate, regular rhythm, normal heart sounds and intact distal pulses.  No murmur heard. Pulmonary/Chest: Effort normal and breath sounds normal.  Abdominal: Soft. Bowel sounds are normal. He exhibits no distension.  Musculoskeletal: He exhibits edema (2+ to right foot. Clear serous drainage from dorsal eschar and right lower shin ).  Right Foot - wearing orthopedic boot. Picture below. Still with some  swelling to top of his foot   Lymphadenopathy:    He has no cervical adenopathy.  Neurological: He is alert and oriented to person, place, and time.  Skin: Skin is warm and dry.  Psychiatric: Affect normal.  Vitals reviewed.        LABS & DIAGNOSTICS:   Sed Rate  Date Value  10/31/2017 80 mm/hr (H)  07/15/2017 73 mm/hr (H)  06/22/2017 72 mm/h (H)   CRP  Date Value  10/31/2017 <0.8 mg/dL  07/15/2017 <0.8 mg/dL  06/22/2017 14.3 mg/L (H)    ASSESSMENT & PLAN:  Problem List Items Addressed This Visit      Cardiovascular and Mediastinum   Essential hypertension - Primary (Chronic)    Still elevated. Advised FU with PCP for management to help with controlling swelling and maximizing blood flow.         Endocrine   Diabetic infection of left heel ([place])    Improving with current therapy. Following with Dr. Amalia Hailey.         Other   Wound eschars of foot, right     I am very happy he is tolerating the Clindamycin therapy. Soft tissue wounds are improved  and no signs of infection present today. Lateral eschar is dry and stable. Dorsal eschar dry with beefy red wound margins. No drainage noted. Continue painting with betadine and wrapping with kerlix as directed by Dr. Amalia Hailey. Will have him continue his Clindamycin until April 21st. He will follow up with our clinic as needed for now and continue to work closely with Dr. Amalia Hailey. Should he need more extensive wound care would consider resuming care with local wound clinic.       Swelling    Improved after IV diuresis. Not worse since increasing his PO lasix. Recommend working with cardiology team to optimize his fluid status. ?related to hypertension that is uncontrolled vs depressed heart function.         [name], MSN, NP-C [facility] [place] [facility] Office: [phone] Pager: [phone]  11/18/17

## 2017-11-17 NOTE — Patient Instructions (Addendum)
Stop your antibiotics on April 19th.   Let me know if the diarrhea comes back and does not go away after 2 days.   Continue working with Dr. Amalia Hailey for wound care.   We are available as needed for you if this looks like you are having worsened infection or if bone is involved.

## 2017-11-18 ENCOUNTER — Encounter: Payer: Self-pay | Admitting: Cardiovascular Disease

## 2017-11-18 ENCOUNTER — Ambulatory Visit: Payer: Medicare HMO | Admitting: Cardiovascular Disease

## 2017-11-18 DIAGNOSIS — I96 Gangrene, not elsewhere classified: Secondary | ICD-10-CM

## 2017-11-18 NOTE — Assessment & Plan Note (Signed)
Mr. Cardarelli is being seen back after I saw him initially +3 months ago for diabetic foot ulcer. I saw him for preoperative vascular clearance. He also had a partial right first toe ray resection by Dr. Amalia Hailey. This segment O pressures and toe pressures suggested that he would heal. He was admitted back in March for sepsis and cellulitis and was seen by Dr. Ruta Hinds and Dr. Sharol Given .Dopplers performed to auscultation showed no obstructive disease. He was placed on [facility] and diuretics for his generalized edema including scrotal edema. Dr. Amalia Hailey is seeing him fairly frequently as an outpatient and apparently his wound is healing.

## 2017-11-18 NOTE — Assessment & Plan Note (Signed)
Improved after IV diuresis. Not worse since increasing his PO lasix. Recommend working with cardiology team to optimize his fluid status. ?related to hypertension that is uncontrolled vs depressed heart function.

## 2017-11-18 NOTE — Assessment & Plan Note (Addendum)
I am very happy he is tolerating the Clindamycin therapy. Soft tissue wounds are improved and no signs of infection present today. Lateral eschar is dry and stable. Dorsal eschar dry with beefy red wound margins. No drainage noted. Continue painting with betadine and wrapping with kerlix as directed by Dr. Amalia Hailey. Will have him continue his Clindamycin until April 21st. He will follow up with our clinic as needed for now and continue to work closely with Dr. Amalia Hailey. Should he need more extensive wound care would consider resuming care with local wound clinic.

## 2017-11-18 NOTE — Assessment & Plan Note (Signed)
Still elevated. Advised FU with PCP for management to help with controlling swelling and maximizing blood flow.

## 2017-11-18 NOTE — Assessment & Plan Note (Signed)
Improving with current therapy. Following with Dr. Amalia Hailey.

## 2017-11-18 NOTE — Progress Notes (Signed)
11/18/2017 Timothy Clark   13-Oct-1970  [id number]  Primary Physician Rosalee Kaufman, PA-C Primary Cardiologist: Lorretta Harp MD FACP, [place], [place], [place]  HPI:  Timothy Clark is a 47 y.o.  mildly overweight single African-American male with no children who is currently out of work. He was referred to me by Dr. Jacinta Shoe for peripheral vascular evaluation prior to right great toe amputation scheduled by Dr. Daylene Katayama.I last saw him in the office 08/12/17. Mr. Clyne has a history of type 2 diabetes last 15 years with triopathy. He's never had a heart stroke. He denies chest pain or shortness of breath. He developed a diabetic ulcer on the medial aspect of his right great toe at the metatarsal head the months ago which healed slowly with outpatient wound care. 2 months ago he had this reexplored and biopsy for question osteomyelitis found on MRI and since that that time the wound has not healed. He has diabetic peripheral neuropathy with numbness of both feet. Segmental pressures performed in our office showed no obstructive disease. He also underwent uncomplicated first toe ray resection by Dr. Amalia Hailey. He was admitted in March for cellulitis and sepsis and was treated with antibiotics. He was seen by Dr. Oneida Alar and Dr. Sharol Given during that hospitalization. Lower extremity arterial doppler studies confirmed no significant obstructive disease.      Current Meds  Medication Sig  . acetaminophen (TYLENOL) 325 MG tablet Take 2 tablets (650 mg total) by mouth every 6 (six) hours as needed for mild pain (or Fever >/= 101).  [name] atorvastatin (LIPITOR) 10 MG tablet Take 10 mg by mouth at bedtime.   . brimonidine (ALPHAGAN) 0.15 % ophthalmic solution Place 1 drop into both eyes 3 (three) times daily.  . cetirizine (ZYRTEC) 10 MG tablet Take 10 mg by mouth at bedtime.   . clindamycin (CLEOCIN) 300 MG capsule Take 1 capsule (300 mg total) by mouth every 6 (six) hours for 21 days.  . cloNIDine  (CATAPRES) 0.2 MG tablet Take 1 tablet (0.2 mg total) by mouth 2 (two) times daily.  [name] dicyclomine (BENTYL) 10 MG capsule TAKE 1 CAPSULE BY MOUTH 4 TIMES DAILY BEFORE MEAL(S) AND AT BEDTIME (Patient taking differently: TAKE 1 CAPSULE (10 MG) BY MOUTH 4 TIMES DAILY - BEFORE MEALS AND AT BEDTIME)  . folic acid (FOLVITE) 1 MG tablet Take 1 tablet (1 mg total) by mouth daily.  . furosemide (LASIX) 40 MG tablet Take 1 tablet (40 mg total) by mouth 2 (two) times daily.  . hydrALAZINE (APRESOLINE) 50 MG tablet Take 1 tablet (50 mg total) by mouth 3 (three) times daily.  . insulin aspart protamine- aspart (NOVOLOG MIX 70/30) (70-30) 100 UNIT/ML injection Inject 0.25 mLs (25 Units total) into the skin 2 (two) times daily with a meal. (Patient taking differently: Inject 30 Units into the skin 2 (two) times daily with a meal. )  . metoprolol succinate (TOPROL-XL) 100 MG 24 hr tablet Take 1 tablet (100 mg total) by mouth daily. Take with or immediately following a meal.  . potassium chloride SA (K-DUR,KLOR-CON) 20 MEQ tablet Take 1 tablet (20 mEq total) by mouth 2 (two) times daily.  . sitaGLIPtin (JANUVIA) 100 MG tablet Take 100 mg by mouth daily.  [name] thiamine 100 MG tablet Take 1 tablet (100 mg total) by mouth daily.  . timolol (TIMOPTIC) 0.5 % ophthalmic solution Place 1 drop into both eyes 2 (two) times daily.     Allergies  Allergen  Reactions  . Donnatal [Pb-Hyoscy-Atropine-Scopolamine] Anaphylaxis    Reaction to GI cocktail  . Fish Allergy Anaphylaxis, Hives and Rash  . Haldol [Haloperidol Lactate] Other (See Comments)    Chest Pain  . Lidocaine Anaphylaxis    Reaction to GI cocktail  . Maalox [Calcium Carbonate Antacid] Anaphylaxis    Reaction to GI cocktail  . Lisinopril Swelling  . Aspirin Hives and Itching  . Bactrim [Sulfamethoxazole-Trimethoprim] Itching  . Doxycycline Hives and Nausea And Vomiting    Severe   . Keflex [Cephalexin] Hives, Itching and Nausea And Vomiting  . Penicillins  Hives and Itching    Has patient had a PCN reaction causing immediate rash, facial/tongue/throat swelling, SOB or lightheadedness with hypotension: yes Has patient had a PCN reaction causing severe rash involving mucus membranes or skin necrosis: yes Has patient had a PCN reaction that required hospitalization no Has patient had a PCN reaction occurring within the last 10 years: yes If all of the above answers are "NO", then may proceed with Cephalospor  . Phenergan [Promethazine Hcl] Nausea And Vomiting    Social History   Socioeconomic History  . Marital status: Single    Spouse name: Not on file  . Number of children: Not on file  . Years of education: Not on file  . Highest education level: Not on file  Occupational History  . Not on file  Social Needs  . Financial resource strain: Not on file  . Food insecurity:    Worry: Not on file    Inability: Not on file  . Transportation needs:    Medical: Not on file    Non-medical: Not on file  Tobacco Use  . Smoking status: Never Smoker  . Smokeless tobacco: Never Used  Substance and Sexual Activity  . Alcohol use: No    Alcohol/week: 0.0 oz  . Drug use: No  . Sexual activity: Yes  Lifestyle  . Physical activity:    Days per week: Not on file    Minutes per session: Not on file  . Stress: Not on file  Relationships  . Social connections:    Talks on phone: Not on file    Gets together: Not on file    Attends religious service: Not on file    Active member of club or organization: Not on file    Attends meetings of clubs or organizations: Not on file    Relationship status: Not on file  . Intimate partner violence:    Fear of current or ex partner: Not on file    Emotionally abused: Not on file    Physically abused: Not on file    Forced sexual activity: Not on file  Other Topics Concern  . Not on file  Social History Narrative  . Not on file     Review of Systems: General: negative for chills, fever, night  sweats or weight changes.  Cardiovascular: negative for chest pain, dyspnea on exertion, edema, orthopnea, palpitations, paroxysmal nocturnal dyspnea or shortness of breath Dermatological: negative for rash Respiratory: negative for cough or wheezing Urologic: negative for hematuria Abdominal: negative for nausea, vomiting, diarrhea, bright red blood per rectum, melena, or hematemesis Neurologic: negative for visual changes, syncope, or dizziness All other systems reviewed and are otherwise negative except as noted above.    Blood pressure 134/82, pulse 80, height 6' (1.829 m), weight (!) 311 lb (141.1 kg).  General appearance: alert and no distress Neck: no adenopathy, no carotid bruit, no JVD, supple, symmetrical,  trachea midline and thyroid not enlarged, symmetric, no tenderness/mass/nodules Lungs: clear to auscultation bilaterally Heart: regular rate and rhythm, S1, S2 normal, no murmur, click, rub or gallop Extremities: his right foot is wrapped with a immobilization/offloading boot. He does have 1-2+ edema on the left. Pulses: 2+ and symmetric Skin: Skin color, texture, turgor normal. No rashes or lesions Neurologic: Alert and oriented X 3, normal strength and tone. Normal symmetric reflexes. Normal coordination and gait  EKG not performed today  ASSESSMENT AND PLAN:   Gangrene of right foot [facility]) Mr. Nauert is being seen back after I saw him initially +3 months ago for diabetic foot ulcer. I saw him for preoperative vascular clearance. He also had a partial right first toe ray resection by Dr. Amalia Hailey. This segment O pressures and toe pressures suggested that he would heal. He was admitted back in March for sepsis and cellulitis and was seen by Dr. Ruta Hinds and Dr. Sharol Given .Dopplers performed to auscultation showed no obstructive disease. He was placed on [facility] and diuretics for his generalized edema including scrotal edema. Dr. Amalia Hailey is seeing him fairly frequently as an  outpatient and apparently his wound is healing.      Lorretta Harp MD FACP,FACC,FAHA, [facility] 11/18/2017 4:22 PM

## 2017-11-18 NOTE — Patient Instructions (Signed)
Medication Instructions:  Your physician recommends that you continue on your current medications as directed. Please refer to the Current Medication list given to you today.   Labwork: none  Testing/Procedures: none  Follow-Up: Follow up with Dr. Berry as needed.   Any Other Special Instructions Will Be Listed Below (If Applicable).     If you need a refill on your cardiac medications before your next appointment, please call your pharmacy.   

## 2017-11-19 DIAGNOSIS — K219 Gastro-esophageal reflux disease without esophagitis: Secondary | ICD-10-CM | POA: Diagnosis not present

## 2017-11-19 DIAGNOSIS — E11621 Type 2 diabetes mellitus with foot ulcer: Secondary | ICD-10-CM | POA: Diagnosis not present

## 2017-11-19 DIAGNOSIS — K3184 Gastroparesis: Secondary | ICD-10-CM | POA: Diagnosis not present

## 2017-11-19 DIAGNOSIS — R451 Restlessness and agitation: Secondary | ICD-10-CM | POA: Diagnosis not present

## 2017-11-19 DIAGNOSIS — I10 Essential (primary) hypertension: Secondary | ICD-10-CM | POA: Diagnosis not present

## 2017-11-19 DIAGNOSIS — R6520 Severe sepsis without septic shock: Secondary | ICD-10-CM | POA: Diagnosis not present

## 2017-11-20 ENCOUNTER — Telehealth: Payer: Self-pay | Admitting: Podiatry

## 2017-11-20 NOTE — Telephone Encounter (Signed)
This is Marcie Bal calling with [facility] of [place]. I need an office visit note saying where Mr. Parham's wounds are at and also what type of wounds they are so that his chart can be coded. Also, we need to know if he still has active cellulitis and if so what type and where is it located? You can fax the information to [place] at [phone] and if you need to speak with [place] the office number is [phone]. Thank you.

## 2017-11-20 NOTE — Telephone Encounter (Signed)
Routed message to Dr. Amalia Hailey for clinicals to fax to [facility].

## 2017-11-21 DIAGNOSIS — I10 Essential (primary) hypertension: Secondary | ICD-10-CM | POA: Diagnosis not present

## 2017-11-21 DIAGNOSIS — R413 Other amnesia: Secondary | ICD-10-CM | POA: Diagnosis not present

## 2017-11-21 DIAGNOSIS — Z886 Allergy status to analgesic agent status: Secondary | ICD-10-CM | POA: Diagnosis not present

## 2017-11-21 DIAGNOSIS — Z89411 Acquired absence of right great toe: Secondary | ICD-10-CM | POA: Diagnosis not present

## 2017-11-21 DIAGNOSIS — R456 Violent behavior: Secondary | ICD-10-CM | POA: Diagnosis not present

## 2017-11-21 DIAGNOSIS — E11621 Type 2 diabetes mellitus with foot ulcer: Secondary | ICD-10-CM | POA: Diagnosis not present

## 2017-11-21 DIAGNOSIS — K3184 Gastroparesis: Secondary | ICD-10-CM | POA: Diagnosis not present

## 2017-11-21 DIAGNOSIS — G4700 Insomnia, unspecified: Secondary | ICD-10-CM | POA: Diagnosis not present

## 2017-11-21 DIAGNOSIS — E1143 Type 2 diabetes mellitus with diabetic autonomic (poly)neuropathy: Secondary | ICD-10-CM | POA: Diagnosis not present

## 2017-11-21 DIAGNOSIS — R109 Unspecified abdominal pain: Secondary | ICD-10-CM | POA: Diagnosis not present

## 2017-11-21 DIAGNOSIS — K219 Gastro-esophageal reflux disease without esophagitis: Secondary | ICD-10-CM | POA: Diagnosis not present

## 2017-11-21 DIAGNOSIS — H5461 Unqualified visual loss, right eye, normal vision left eye: Secondary | ICD-10-CM | POA: Diagnosis not present

## 2017-11-21 DIAGNOSIS — R140 Abdominal distension (gaseous): Secondary | ICD-10-CM | POA: Diagnosis not present

## 2017-11-21 DIAGNOSIS — F323 Major depressive disorder, single episode, severe with psychotic features: Secondary | ICD-10-CM | POA: Diagnosis not present

## 2017-11-21 DIAGNOSIS — R0789 Other chest pain: Secondary | ICD-10-CM | POA: Diagnosis not present

## 2017-11-21 DIAGNOSIS — R410 Disorientation, unspecified: Secondary | ICD-10-CM | POA: Diagnosis not present

## 2017-11-21 DIAGNOSIS — R6520 Severe sepsis without septic shock: Secondary | ICD-10-CM | POA: Diagnosis not present

## 2017-11-21 DIAGNOSIS — E785 Hyperlipidemia, unspecified: Secondary | ICD-10-CM | POA: Diagnosis not present

## 2017-11-21 DIAGNOSIS — R4182 Altered mental status, unspecified: Secondary | ICD-10-CM | POA: Diagnosis not present

## 2017-11-21 DIAGNOSIS — R451 Restlessness and agitation: Secondary | ICD-10-CM | POA: Diagnosis not present

## 2017-11-21 DIAGNOSIS — F322 Major depressive disorder, single episode, severe without psychotic features: Secondary | ICD-10-CM | POA: Diagnosis not present

## 2017-11-21 DIAGNOSIS — L03115 Cellulitis of right lower limb: Secondary | ICD-10-CM | POA: Diagnosis not present

## 2017-11-21 DIAGNOSIS — Z880 Allergy status to penicillin: Secondary | ICD-10-CM | POA: Diagnosis not present

## 2017-11-22 DIAGNOSIS — E1122 Type 2 diabetes mellitus with diabetic chronic kidney disease: Secondary | ICD-10-CM | POA: Diagnosis not present

## 2017-11-22 DIAGNOSIS — R410 Disorientation, unspecified: Secondary | ICD-10-CM | POA: Diagnosis not present

## 2017-11-22 DIAGNOSIS — G9340 Encephalopathy, unspecified: Secondary | ICD-10-CM | POA: Diagnosis not present

## 2017-11-22 DIAGNOSIS — J9601 Acute respiratory failure with hypoxia: Secondary | ICD-10-CM | POA: Diagnosis not present

## 2017-11-22 DIAGNOSIS — R451 Restlessness and agitation: Secondary | ICD-10-CM | POA: Diagnosis not present

## 2017-11-22 DIAGNOSIS — F4310 Post-traumatic stress disorder, unspecified: Secondary | ICD-10-CM | POA: Diagnosis not present

## 2017-11-22 DIAGNOSIS — R4182 Altered mental status, unspecified: Secondary | ICD-10-CM | POA: Diagnosis not present

## 2017-11-22 DIAGNOSIS — E119 Type 2 diabetes mellitus without complications: Secondary | ICD-10-CM | POA: Diagnosis not present

## 2017-11-22 DIAGNOSIS — Z8659 Personal history of other mental and behavioral disorders: Secondary | ICD-10-CM | POA: Diagnosis not present

## 2017-11-22 DIAGNOSIS — Z046 Encounter for general psychiatric examination, requested by authority: Secondary | ICD-10-CM | POA: Diagnosis not present

## 2017-11-22 DIAGNOSIS — N289 Disorder of kidney and ureter, unspecified: Secondary | ICD-10-CM | POA: Diagnosis not present

## 2017-11-22 DIAGNOSIS — I130 Hypertensive heart and chronic kidney disease with heart failure and stage 1 through stage 4 chronic kidney disease, or unspecified chronic kidney disease: Secondary | ICD-10-CM | POA: Diagnosis not present

## 2017-11-22 DIAGNOSIS — I5032 Chronic diastolic (congestive) heart failure: Secondary | ICD-10-CM | POA: Diagnosis not present

## 2017-11-22 DIAGNOSIS — I10 Essential (primary) hypertension: Secondary | ICD-10-CM | POA: Diagnosis not present

## 2017-11-22 DIAGNOSIS — F062 Psychotic disorder with delusions due to known physiological condition: Secondary | ICD-10-CM | POA: Diagnosis not present

## 2017-11-22 DIAGNOSIS — D649 Anemia, unspecified: Secondary | ICD-10-CM | POA: Diagnosis not present

## 2017-11-22 DIAGNOSIS — E785 Hyperlipidemia, unspecified: Secondary | ICD-10-CM | POA: Diagnosis not present

## 2017-11-23 DIAGNOSIS — I10 Essential (primary) hypertension: Secondary | ICD-10-CM | POA: Diagnosis not present

## 2017-11-23 DIAGNOSIS — R600 Localized edema: Secondary | ICD-10-CM | POA: Diagnosis not present

## 2017-11-23 DIAGNOSIS — R451 Restlessness and agitation: Secondary | ICD-10-CM | POA: Diagnosis not present

## 2017-11-23 DIAGNOSIS — D649 Anemia, unspecified: Secondary | ICD-10-CM | POA: Diagnosis not present

## 2017-11-23 DIAGNOSIS — E119 Type 2 diabetes mellitus without complications: Secondary | ICD-10-CM | POA: Diagnosis not present

## 2017-11-23 DIAGNOSIS — I5032 Chronic diastolic (congestive) heart failure: Secondary | ICD-10-CM | POA: Diagnosis not present

## 2017-11-23 DIAGNOSIS — E785 Hyperlipidemia, unspecified: Secondary | ICD-10-CM | POA: Diagnosis not present

## 2017-11-23 NOTE — Progress Notes (Signed)
   Subjective:  Patient presents today status post partial first ray amputation right foot on 08/20/2017.  Patient also presents for follow-up evaluation regarding ulcerations to the bilateral feet.  Patient has a appointment with vascular on Wednesday.  Patient states that recently he was admitted into the hospital and possible amputation was recommended at that time.  However the patient went home without undergoing the surgical amputation and he presents today for follow-up treatment evaluation   Past Medical History:  Diagnosis Date  . Chronic abdominal pain   . Esophagitis   . Eye hemorrhage   . Gastritis   . Gastroparesis   . GERD (gastroesophageal reflux disease)   . Glaucoma   . Helicobacter pylori (H. pylori) infection 2016   ERADICATION DOCUMENTED VIA STOOL TEST in AUG 2016 at [place]  . Hiccups   . Hypertension   . Nausea and vomiting    chronic, recurrent  . Type 2 diabetes mellitus with complications (HCC)    diagnosed around age 81              Objective: Physical Exam General: The patient is alert and oriented x3 in no acute distress.  Ischemic ulcerations with overlying eschar noted to the dorsum and lateral aspect of the right foot secondary to tight dressing wraps.  Dorsomedial ulceration measures approximately 4.0 x 4.0 x 0.1 cm (LxWxD).  The lateral ulceration measures approximately 9.0 x 3.0 x 0.1 cm (LxWxD).  To the noted ulcerations there is a well adhered eschar.  Negative for any significant drainage.  Negative for malodor.  Purulent integrity is intact.  Vascular: Diminished pedal pulses bilaterally. there is some moderate edema noted to the foot.   Neurological: Epicritic and protective threshold absent bilaterally.   Musculoskeletal Exam: Well-healing partial first ray amputation right foot  Assessment: 1. s/p partial first ray amputation right. DOS: 08/20/17 2.  Ischemic gangrenous ulcerations right foot 3.  Diabetes mellitus with  neuropathy 4.  Peripheral vascular disease   Plan of Care:  1. Patient was evaluated.   2.  Medically necessary excisional debridement including muscle and deep fascial tissue was performed to the outside areas of eschar that was detaching from the periwound skin using a tissue nipper. 3.  Continue Betadine wet-to-dry dressing changes.  Orders for home health were provided. 4.  Return to clinic in 2 weeks  Edrick Kins, DPM [facility]  Dr. Edrick Kins, [address] [place]                                        [place], [place] [address]                Office [phone]  Fax [phone]

## 2017-11-24 ENCOUNTER — Ambulatory Visit: Payer: Medicare HMO | Admitting: Gastroenterology

## 2017-11-24 DIAGNOSIS — E785 Hyperlipidemia, unspecified: Secondary | ICD-10-CM | POA: Diagnosis not present

## 2017-11-24 DIAGNOSIS — R600 Localized edema: Secondary | ICD-10-CM | POA: Diagnosis not present

## 2017-11-24 DIAGNOSIS — D649 Anemia, unspecified: Secondary | ICD-10-CM | POA: Diagnosis not present

## 2017-11-24 DIAGNOSIS — E119 Type 2 diabetes mellitus without complications: Secondary | ICD-10-CM | POA: Diagnosis not present

## 2017-11-24 DIAGNOSIS — I5032 Chronic diastolic (congestive) heart failure: Secondary | ICD-10-CM | POA: Diagnosis not present

## 2017-11-24 DIAGNOSIS — R451 Restlessness and agitation: Secondary | ICD-10-CM | POA: Diagnosis not present

## 2017-11-24 DIAGNOSIS — I10 Essential (primary) hypertension: Secondary | ICD-10-CM | POA: Diagnosis not present

## 2017-11-24 DIAGNOSIS — I080 Rheumatic disorders of both mitral and aortic valves: Secondary | ICD-10-CM | POA: Diagnosis not present

## 2017-11-27 DIAGNOSIS — K219 Gastro-esophageal reflux disease without esophagitis: Secondary | ICD-10-CM | POA: Diagnosis not present

## 2017-11-27 DIAGNOSIS — I10 Essential (primary) hypertension: Secondary | ICD-10-CM | POA: Diagnosis not present

## 2017-11-27 DIAGNOSIS — K3184 Gastroparesis: Secondary | ICD-10-CM | POA: Diagnosis not present

## 2017-11-27 DIAGNOSIS — E11621 Type 2 diabetes mellitus with foot ulcer: Secondary | ICD-10-CM | POA: Diagnosis not present

## 2017-11-27 DIAGNOSIS — R6520 Severe sepsis without septic shock: Secondary | ICD-10-CM | POA: Diagnosis not present

## 2017-11-27 DIAGNOSIS — R451 Restlessness and agitation: Secondary | ICD-10-CM | POA: Diagnosis not present

## 2017-12-01 ENCOUNTER — Other Ambulatory Visit: Payer: Self-pay

## 2017-12-01 ENCOUNTER — Encounter (HOSPITAL_COMMUNITY): Payer: Self-pay | Admitting: Emergency Medicine

## 2017-12-01 ENCOUNTER — Emergency Department (HOSPITAL_COMMUNITY): Payer: Medicare HMO

## 2017-12-01 ENCOUNTER — Emergency Department (HOSPITAL_COMMUNITY)
Admission: EM | Admit: 2017-12-01 | Discharge: 2017-12-02 | Disposition: A | Discharge status: Home / Self Care | Payer: Medicare HMO | Attending: Emergency Medicine | Admitting: Emergency Medicine

## 2017-12-01 DIAGNOSIS — R079 Chest pain, unspecified: Secondary | ICD-10-CM | POA: Diagnosis not present

## 2017-12-01 DIAGNOSIS — R451 Restlessness and agitation: Secondary | ICD-10-CM

## 2017-12-01 DIAGNOSIS — Z79899 Other long term (current) drug therapy: Secondary | ICD-10-CM | POA: Diagnosis not present

## 2017-12-01 DIAGNOSIS — Z794 Long term (current) use of insulin: Secondary | ICD-10-CM | POA: Diagnosis not present

## 2017-12-01 DIAGNOSIS — F4312 Post-traumatic stress disorder, chronic: Secondary | ICD-10-CM | POA: Diagnosis not present

## 2017-12-01 DIAGNOSIS — N183 Chronic kidney disease, stage 3 (moderate): Secondary | ICD-10-CM | POA: Diagnosis not present

## 2017-12-01 DIAGNOSIS — I129 Hypertensive chronic kidney disease with stage 1 through stage 4 chronic kidney disease, or unspecified chronic kidney disease: Secondary | ICD-10-CM | POA: Insufficient documentation

## 2017-12-01 DIAGNOSIS — E1139 Type 2 diabetes mellitus with other diabetic ophthalmic complication: Secondary | ICD-10-CM | POA: Diagnosis not present

## 2017-12-01 DIAGNOSIS — I5032 Chronic diastolic (congestive) heart failure: Secondary | ICD-10-CM | POA: Diagnosis not present

## 2017-12-01 DIAGNOSIS — E11621 Type 2 diabetes mellitus with foot ulcer: Secondary | ICD-10-CM | POA: Diagnosis not present

## 2017-12-01 DIAGNOSIS — K219 Gastro-esophageal reflux disease without esophagitis: Secondary | ICD-10-CM | POA: Diagnosis not present

## 2017-12-01 DIAGNOSIS — E1165 Type 2 diabetes mellitus with hyperglycemia: Secondary | ICD-10-CM | POA: Diagnosis not present

## 2017-12-01 DIAGNOSIS — E782 Mixed hyperlipidemia: Secondary | ICD-10-CM | POA: Diagnosis not present

## 2017-12-01 DIAGNOSIS — R456 Violent behavior: Secondary | ICD-10-CM | POA: Insufficient documentation

## 2017-12-01 DIAGNOSIS — E785 Hyperlipidemia, unspecified: Secondary | ICD-10-CM | POA: Diagnosis not present

## 2017-12-01 DIAGNOSIS — R109 Unspecified abdominal pain: Secondary | ICD-10-CM | POA: Diagnosis not present

## 2017-12-01 DIAGNOSIS — F3481 Disruptive mood dysregulation disorder: Secondary | ICD-10-CM | POA: Insufficient documentation

## 2017-12-01 DIAGNOSIS — E119 Type 2 diabetes mellitus without complications: Secondary | ICD-10-CM | POA: Diagnosis not present

## 2017-12-01 DIAGNOSIS — I10 Essential (primary) hypertension: Secondary | ICD-10-CM | POA: Diagnosis not present

## 2017-12-01 DIAGNOSIS — K3184 Gastroparesis: Secondary | ICD-10-CM | POA: Diagnosis not present

## 2017-12-01 DIAGNOSIS — R6520 Severe sepsis without septic shock: Secondary | ICD-10-CM | POA: Diagnosis not present

## 2017-12-01 LAB — CBC WITH DIFFERENTIAL/PLATELET
Basophils Absolute: 0 10*3/uL (ref 0.0–0.1)
Basophils Relative: 0 %
Eosinophils Absolute: 0.1 10*3/uL (ref 0.0–0.7)
Eosinophils Relative: 1 %
HCT: 31.3 % — ABNORMAL LOW (ref 39.0–52.0)
Hemoglobin: 10.6 g/dL — ABNORMAL LOW (ref 13.0–17.0)
Lymphocytes Relative: 18 %
Lymphs Abs: 1.6 10*3/uL (ref 0.7–4.0)
MCH: 30.7 pg (ref 26.0–34.0)
MCHC: 33.9 g/dL (ref 30.0–36.0)
MCV: 90.7 fL (ref 78.0–100.0)
Monocytes Absolute: 0.6 10*3/uL (ref 0.1–1.0)
Monocytes Relative: 7 %
Neutro Abs: 6.7 10*3/uL (ref 1.7–7.7)
Neutrophils Relative %: 74 %
Platelets: 201 10*3/uL (ref 150–400)
RBC: 3.45 MIL/uL — ABNORMAL LOW (ref 4.22–5.81)
RDW: 13.9 % (ref 11.5–15.5)
WBC: 9 10*3/uL (ref 4.0–10.5)

## 2017-12-01 LAB — COMPREHENSIVE METABOLIC PANEL
ALT: 9 U/L — ABNORMAL LOW (ref 17–63)
AST: 15 U/L (ref 15–41)
Albumin: 2.8 g/dL — ABNORMAL LOW (ref 3.5–5.0)
Alkaline Phosphatase: 62 U/L (ref 38–126)
Anion gap: 12 (ref 5–15)
BUN: 11 mg/dL (ref 6–20)
CO2: 23 mmol/L (ref 22–32)
Calcium: 8.8 mg/dL — ABNORMAL LOW (ref 8.9–10.3)
Chloride: 105 mmol/L (ref 101–111)
Creatinine, Ser: 1.43 mg/dL — ABNORMAL HIGH (ref 0.61–1.24)
GFR calc Af Amer: >60 mL/min (ref >60)
GFR calc non Af Amer: 57 mL/min — ABNORMAL LOW (ref >60)
Glucose, Bld: 111 mg/dL — ABNORMAL HIGH (ref 65–99)
Potassium: 3.6 mmol/L (ref 3.5–5.1)
Sodium: 140 mmol/L (ref 135–145)
Total Bilirubin: 0.9 mg/dL (ref 0.3–1.2)
Total Protein: 6.5 g/dL (ref 6.5–8.1)

## 2017-12-01 LAB — TROPONIN I: Troponin I: <0.03 ng/mL (ref <0.03)

## 2017-12-01 LAB — CBG MONITORING, ED: Glucose-Capillary: 110 mg/dL — ABNORMAL HIGH (ref 65–99)

## 2017-12-01 LAB — LIPASE, BLOOD: Lipase: 24 U/L (ref 11–51)

## 2017-12-01 LAB — ETHANOL: Alcohol, Ethyl (B): <10 mg/dL (ref <10)

## 2017-12-01 MED ORDER — SODIUM CHLORIDE 0.9 % IV BOLUS
1000.0000 mL | Freq: Once | INTRAVENOUS | Status: AC
  Administered 2017-12-01: 1000 mL via INTRAVENOUS

## 2017-12-01 MED ORDER — FENTANYL CITRATE (PF) 100 MCG/2ML IJ SOLN
50.0000 ug | Freq: Once | INTRAMUSCULAR | Status: AC
  Administered 2017-12-01: 50 ug via INTRAVENOUS
  Filled 2017-12-01: qty 2

## 2017-12-01 MED ORDER — LORAZEPAM 2 MG/ML IJ SOLN
1.0000 mg | Freq: Once | INTRAMUSCULAR | Status: AC
  Administered 2017-12-01: 1 mg via INTRAVENOUS

## 2017-12-01 MED ORDER — LORAZEPAM 2 MG/ML IJ SOLN
INTRAMUSCULAR | Status: AC
  Administered 2017-12-01: 2 mg via INTRAMUSCULAR
  Filled 2017-12-01: qty 1

## 2017-12-01 MED ORDER — LORAZEPAM 2 MG/ML IJ SOLN
2.0000 mg | Freq: Once | INTRAMUSCULAR | Status: AC
  Administered 2017-12-01: 2 mg via INTRAMUSCULAR

## 2017-12-01 MED ORDER — LORAZEPAM 2 MG/ML IJ SOLN
INTRAMUSCULAR | Status: AC
  Administered 2017-12-01: 1 mg via INTRAVENOUS
  Filled 2017-12-01: qty 1

## 2017-12-01 NOTE — ED Triage Notes (Signed)
Pt c/o CP and abdominal pain that began today. Pt hx of gastroparesis. Pt is legally blind. Pt attempted to lunge out of wheelchair in hallway. Pt also noted to have a brace on RT foot.
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
## 2017-12-01 NOTE — ED Notes (Signed)
Pt very agitated, continuously trying to get out of bed.  Pt tried to be redirected by this RN and other nurses.  Pt continues to be aggressive. Pt given now  Total  Of 4 mg of ativan

## 2017-12-01 NOTE — ED Provider Notes (Addendum)
[facility] EMERGENCY DEPARTMENT Provider Note   CSN: [id number] Arrival date & time: 12/01/17  1750     History   Chief Complaint Chief Complaint  Patient presents with  . Chest Pain    HPI Timothy Clark is a 47 y.o. male.  Level 5 caveat for agitation and behavioral disturbance.  History obtained from patient and his sister.  Patient is not in good health.  Past medical history includes diabetes, poor vision, chronic abdominal pain, gastroparesis.  He becomes agitated very easily.  The family does not know how to control his aberrant behavior.  Tonight he complains of chest and abdominal pain.  No substernal chest pain, dyspnea, fever, sweats, chills, dysuria, neuro deficits.     Past Medical History:  Diagnosis Date  . Chronic abdominal pain   . Esophagitis   . Eye hemorrhage   . Gastritis   . Gastroparesis   . GERD (gastroesophageal reflux disease)   . Glaucoma   . Helicobacter pylori (H. pylori) infection 2016   ERADICATION DOCUMENTED VIA STOOL TEST in AUG 2016 at [place]  . Hiccups   . Hypertension   . Nausea and vomiting    chronic, recurrent  . Type 2 diabetes mellitus with complications (HCC)    diagnosed around age 11    Patient Active Problem List   Diagnosis Date Noted  . Scrotal swelling 11/10/2017  . Swelling 11/09/2017  . Scrotal edema   . Obesity, Class III, BMI 40-49.9 (morbid obesity) ([place]) 11/02/2017  . CKD stage 3 due to type 2 diabetes mellitus ([place]) 10/30/2017  . Wound eschars of foot, right  10/30/2017  . Chronic abdominal pain 09/21/2017  . Prolonged QT interval 09/21/2017  . GERD (gastroesophageal reflux disease) 07/14/2017  . Glaucoma 07/14/2017  . Anemia 07/14/2017  . Pressure injury of skin 02/01/2017  . Insulin dependent diabetes mellitus ([place]) 09/07/2016  . Orthostatic syncope 09/05/2016  . 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 Gastroparesis due to DM ([employer]) 02/06/2015  . Essential hypertension 12/06/2014  . Diabetic infection of  left heel ([place]) 02/14/2010  . UNSPECIFIED PERIPHERAL VASCULAR DISEASE 10/19/2009  . ERECTILE DYSFUNCTION, ORGANIC 10/19/2009  . NUMBNESS 07/20/2009  . Diabetes mellitus type 2 with complications, uncontrolled ([place]) 03/22/2009  . Hyperlipidemia 03/22/2009  . Depression 03/22/2009    Past Surgical History:  Procedure Laterality Date  . CATARACT EXTRACTION W/PHACO Left 05/19/2016   Procedure: CATARACT EXTRACTION PHACO AND INTRAOCULAR LENS PLACEMENT LEFT EYE;  Surgeon: Tonny Branch, MD;  Location: [facility];  Service: Ophthalmology;  Laterality: Left;  CDE: 7.30  . CATARACT EXTRACTION W/PHACO Right 06/23/2016   Procedure: CATARACT EXTRACTION PHACO AND INTRAOCULAR LENS PLACEMENT RIGHT EYE CDE=9.87;  Surgeon: Tonny Branch, MD;  Location: [facility];  Service: Ophthalmology;  Laterality: Right;  right  . ESOPHAGOGASTRODUODENOSCOPY  2015   Dr. Britta Mccreedy  . ESOPHAGOGASTRODUODENOSCOPY N/A 10/26/2014   RMR: Distal esophagititis-likely reflux related although an element of pill induced injuruy no exclueded  status post biopsy. Diffusely abnormal gastric mucosa of uncertain signigicane -status post gastric biopsy. Focal area of excoriation in the cardia most consistant with a trauma of heaving.   . ESOPHAGOGASTRODUODENOSCOPY  08/2015   [facility]: mild esophagitis and old gastric contents  . EYE SURGERY  2015   stent placed to left eye  . EYE SURGERY    . INCISION AND DRAINAGE OF WOUND Right 07/16/2017   Procedure: IRRIGATION AND DEBRIDEMENT OF SOFT TISSUE OF ULCERATION RIGHT FOOT;  Surgeon: Caprice Beaver, DPM;  Location: [facility];  Service: Podiatry;  Laterality: Right;        Home Medications    Prior to Admission medications   Medication Sig Start Date End Date Taking? Authorizing Provider  acetaminophen (TYLENOL) 325 MG tablet Take 2 tablets (650 mg total) by mouth every 6 (six) hours as needed for mild pain (or Fever >/= 101). 09/24/17   Georgette Shell, MD  atorvastatin (LIPITOR) 10 MG tablet Take 10  mg by mouth at bedtime.     [provider]  brimonidine (ALPHAGAN) 0.15 % ophthalmic solution Place 1 drop into both eyes 3 (three) times daily.    [provider]  cetirizine (ZYRTEC) 10 MG tablet Take 10 mg by mouth at bedtime.     [provider]  cloNIDine (CATAPRES) 0.2 MG tablet Take 1 tablet (0.2 mg total) by mouth 2 (two) times daily. 11/06/17   Danford, Suann Larry, MD  dicyclomine (BENTYL) 10 MG capsule TAKE 1 CAPSULE BY MOUTH 4 TIMES DAILY BEFORE MEAL(S) AND AT BEDTIME Patient taking differently: TAKE 1 CAPSULE (10 MG) BY MOUTH 4 TIMES DAILY - BEFORE MEALS AND AT BEDTIME 08/06/17   Carlis Stable, NP  folic acid (FOLVITE) 1 MG tablet Take 1 tablet (1 mg total) by mouth daily. 09/24/17   Georgette Shell, MD  furosemide (LASIX) 40 MG tablet Take 1 tablet (40 mg total) by mouth 2 (two) times daily. 11/12/17   Caren Griffins, MD  hydrALAZINE (APRESOLINE) 50 MG tablet Take 1 tablet (50 mg total) by mouth 3 (three) times daily. 11/06/17   Danford, Suann Larry, MD  insulin aspart protamine- aspart (NOVOLOG MIX 70/30) (70-30) 100 UNIT/ML injection Inject 0.25 mLs (25 Units total) into the skin 2 (two) times daily with a meal. Patient taking differently: Inject 30 Units into the skin 2 (two) times daily with a meal.  09/24/17   Georgette Shell, MD  metoprolol succinate (TOPROL-XL) 100 MG 24 hr tablet Take 1 tablet (100 mg total) by mouth daily. Take with or immediately following a meal. 11/07/17   Danford, Suann Larry, MD  potassium chloride SA (K-DUR,KLOR-CON) 20 MEQ tablet Take 1 tablet (20 mEq total) by mouth 2 (two) times daily. 11/12/17   Caren Griffins, MD  sitaGLIPtin (JANUVIA) 100 MG tablet Take 100 mg by mouth daily.    [provider]  thiamine 100 MG tablet Take 1 tablet (100 mg total) by mouth daily. 09/24/17   Georgette Shell, MD  timolol (TIMOPTIC) 0.5 % ophthalmic solution Place 1 drop into both eyes 2 (two) times daily.    [provider]    Family History Family History  Problem Relation Age of Onset  . Ovarian cancer Mother   . Heart attack Father   . Cervical cancer Sister   . Diabetes Sister   . Colon cancer Neg Hx     Social History Social History   Tobacco Use  . Smoking status: Never Smoker  . Smokeless tobacco: Never Used  Substance Use Topics  . Alcohol use: No    Alcohol/week: 0.0 oz  . Drug use: No     Allergies   Donnatal [pb-hyoscy-atropine-scopolamine]; Fish allergy; Haldol [haloperidol lactate]; Lidocaine; Maalox [calcium carbonate antacid]; Lisinopril; Aspirin; Bactrim [sulfamethoxazole-trimethoprim]; Doxycycline; Keflex [cephalexin]; Penicillins; and Phenergan [promethazine hcl]   Review of Systems Review of Systems  Unable to perform ROS: Psychiatric disorder     Physical Exam Updated Vital Signs BP (!) 181/98   Pulse 83   Temp 98.7 F (  37.1 C) (Axillary)   Resp 19   Ht 6' (1.829 m)   Wt (!) 141.1 kg (311 lb)   SpO2 99%   BMI 42.18 kg/m   Physical Exam  Constitutional:  Agitated, unruly, does not follow direct commands  HENT:  Head: Normocephalic and atraumatic.  Eyes: Conjunctivae are normal.  Neck: Neck supple.  Cardiovascular: Normal rate and regular rhythm.  Pulmonary/Chest: Effort normal and breath sounds normal.  Abdominal: Soft. Bowel sounds are normal.  Musculoskeletal: Normal range of motion.  Neurological: He is alert.  Skin: Skin is warm and dry.  Psychiatric:  Agitated  Nursing note and vitals reviewed.    ED Treatments / Results  Labs (all labs ordered are listed, but only abnormal results are displayed) Labs Reviewed  CBC WITH DIFFERENTIAL/PLATELET - Abnormal; Notable for the following components:      Result Value   RBC 3.45 (*)    Hemoglobin 10.6 (*)    HCT 31.3 (*)    All other components within normal limits  COMPREHENSIVE METABOLIC PANEL - Abnormal; Notable for the following components:   Glucose, Bld 111 (*)     Creatinine, Ser 1.43 (*)    Calcium 8.8 (*)    Albumin 2.8 (*)    ALT 9 (*)    GFR calc non Af Amer 57 (*)    All other components within normal limits  CBG MONITORING, ED - Abnormal; Notable for the following components:   Glucose-Capillary 110 (*)    All other components within normal limits  LIPASE, BLOOD  TROPONIN I  ETHANOL  RAPID URINE DRUG SCREEN, HOSP PERFORMED    EKG EKG Interpretation  Date/Time:  Tuesday December 01 2017 18:25:40 EDT Ventricular Rate:  81 PR Interval:    QRS Duration: 74 QT Interval:  419 QTC Calculation: 487 R Axis:   0 Text Interpretation:  Sinus rhythm Low voltage, precordial leads Borderline prolonged QT interval Confirmed by [name] [phone]) on 12/01/2017 7:34:08 PM   Radiology Dg Chest Port 1 View  Result Date: 12/01/2017 CLINICAL DATA:  Chest pain for several hours EXAM: PORTABLE CHEST 1 VIEW COMPARISON:  11/09/2017 FINDINGS: The heart size and mediastinal contours are within normal limits. Both lungs are clear. The visualized skeletal structures are unremarkable. IMPRESSION: No active disease. Electronically Signed   By: Inez Catalina M.D.   On: 12/01/2017 18:44    Procedures Procedures (including critical care time)  Medications Ordered in ED Medications  LORazepam (ATIVAN) injection 2 mg (2 mg Intramuscular Given 12/01/17 1810)  LORazepam (ATIVAN) injection 1 mg (1 mg Intravenous Given 12/01/17 1824)  sodium chloride 0.9 % bolus 1,000 mL (1,000 mLs Intravenous New Bag/Given 12/01/17 1833)  LORazepam (ATIVAN) injection 1 mg (1 mg Intravenous Given 12/01/17 1833)     Initial Impression / Assessment and Plan / ED Course  I have reviewed the triage vital signs and the nursing notes.  Pertinent labs & imaging results that were available during my care of the patient were reviewed by me and considered in my medical decision making (see chart for details).     Patient with diabetes, gastroparesis presents with abdominal and chest pain  along with agitation and behavioral disturbance.  No metabolic issue identified.  Patient required intravenous Ativan and restraints to control his aggressive behavior.  Will consult behavioral health for possible admission.  Involuntary commitment papers signed by self.   CRITICAL CARE Performed by: [name]  ?  Total critical care time: 35 minutes  Critical care time  was exclusive of separately billable procedures and treating other patients.  Critical care was necessary to treat or prevent imminent or life-threatening deterioration.  Critical care was time spent personally by me on the following activities: development of treatment plan with patient and/or surrogate as well as nursing, discussions with consultants, evaluation of patient's response to treatment, examination of patient, obtaining history from patient or surrogate, ordering and performing treatments and interventions, ordering and review of laboratory studies, ordering and review of radiographic studies, pulse oximetry and re-evaluation of patient's condition.  Final Clinical Impressions(s) / ED Diagnoses   Final diagnoses:  Agitation  Behavior disturbance    ED Discharge Orders    None       [name], MD 12/01/17 2108    [name], MD 12/01/17 2124

## 2017-12-02 ENCOUNTER — Ambulatory Visit: Payer: Medicare HMO | Admitting: Podiatry

## 2017-12-02 ENCOUNTER — Encounter (HOSPITAL_COMMUNITY): Payer: Self-pay | Admitting: Registered Nurse

## 2017-12-02 LAB — RAPID URINE DRUG SCREEN, HOSP PERFORMED
Amphetamines: NOT DETECTED
Barbiturates: NOT DETECTED
Benzodiazepines: POSITIVE — AB
Cocaine: NOT DETECTED
Opiates: NOT DETECTED
Tetrahydrocannabinol: NOT DETECTED

## 2017-12-02 LAB — CBG MONITORING, ED: Glucose-Capillary: 130 mg/dL — ABNORMAL HIGH (ref 65–99)

## 2017-12-02 MED ORDER — FUROSEMIDE 40 MG PO TABS
40.0000 mg | ORAL_TABLET | Freq: Two times a day (BID) | ORAL | Status: DC
  Administered 2017-12-02: 40 mg via ORAL
  Filled 2017-12-02: qty 1

## 2017-12-02 MED ORDER — CLONIDINE HCL 0.2 MG PO TABS
0.2000 mg | ORAL_TABLET | Freq: Two times a day (BID) | ORAL | Status: DC
  Administered 2017-12-02: 0.2 mg via ORAL
  Filled 2017-12-02: qty 1

## 2017-12-02 MED ORDER — POTASSIUM CHLORIDE CRYS ER 20 MEQ PO TBCR
20.0000 meq | EXTENDED_RELEASE_TABLET | Freq: Two times a day (BID) | ORAL | Status: DC
  Administered 2017-12-02: 20 meq via ORAL
  Filled 2017-12-02: qty 1

## 2017-12-02 MED ORDER — ACETAMINOPHEN 500 MG PO TABS
1000.0000 mg | ORAL_TABLET | Freq: Once | ORAL | Status: AC
  Administered 2017-12-02: 1000 mg via ORAL
  Filled 2017-12-02: qty 2

## 2017-12-02 MED ORDER — METOPROLOL SUCCINATE ER 50 MG PO TB24
100.0000 mg | ORAL_TABLET | Freq: Every day | ORAL | Status: DC
  Administered 2017-12-02: 100 mg via ORAL
  Filled 2017-12-02 (×3): qty 2

## 2017-12-02 MED ORDER — FOLIC ACID 1 MG PO TABS
1.0000 mg | ORAL_TABLET | Freq: Every day | ORAL | Status: DC
  Administered 2017-12-02: 1 mg via ORAL
  Filled 2017-12-02: qty 1

## 2017-12-02 MED ORDER — LORAZEPAM 2 MG/ML IJ SOLN
1.0000 mg | Freq: Once | INTRAMUSCULAR | Status: AC
Start: 2017-12-02 — End: 2017-12-02
  Administered 2017-12-02: 1 mg via INTRAVENOUS
  Filled 2017-12-02: qty 1

## 2017-12-02 MED ORDER — LINAGLIPTIN 5 MG PO TABS
5.0000 mg | ORAL_TABLET | Freq: Every day | ORAL | Status: DC
  Administered 2017-12-02: 5 mg via ORAL
  Filled 2017-12-02 (×3): qty 1

## 2017-12-02 MED ORDER — INSULIN ASPART PROT & ASPART (70-30 MIX) 100 UNIT/ML ~~LOC~~ SUSP
30.0000 [IU] | Freq: Two times a day (BID) | SUBCUTANEOUS | Status: DC
  Administered 2017-12-02: 30 [IU] via SUBCUTANEOUS
  Filled 2017-12-02: qty 10

## 2017-12-02 MED ORDER — DICYCLOMINE HCL 10 MG PO CAPS: 10.0000 mg | ORAL_CAPSULE | Freq: Three times a day (TID) | ORAL | Status: DC

## 2017-12-02 MED ORDER — LORATADINE 10 MG PO TABS
10.0000 mg | ORAL_TABLET | Freq: Every day | ORAL | Status: DC
  Administered 2017-12-02: 10 mg via ORAL
  Filled 2017-12-02: qty 1

## 2017-12-02 MED ORDER — TIMOLOL MALEATE 0.5 % OP SOLN
1.0000 [drp] | Freq: Two times a day (BID) | OPHTHALMIC | Status: DC
  Filled 2017-12-02: qty 5

## 2017-12-02 MED ORDER — HYDRALAZINE HCL 25 MG PO TABS
50.0000 mg | ORAL_TABLET | Freq: Three times a day (TID) | ORAL | Status: DC
  Administered 2017-12-02: 50 mg via ORAL
  Filled 2017-12-02: qty 2

## 2017-12-02 MED ORDER — ATORVASTATIN CALCIUM 10 MG PO TABS: 10.0000 mg | ORAL_TABLET | Freq: Every day | ORAL | Status: DC

## 2017-12-02 NOTE — ED Notes (Signed)
Pt resting quietly with eyes shut at this time.  Easy rise and fall of chest .

## 2017-12-02 NOTE — ED Notes (Signed)
Pt agitated and trying to force his way out of his room, security in room and [employer] called

## 2017-12-02 NOTE — ED Notes (Signed)
Pt sitting up on side of stretcher.  Refusing to eat breakfast.
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
## 2017-12-02 NOTE — ED Notes (Signed)
Pt having telepsych assessment at this time.

## 2017-12-02 NOTE — Consult Note (Addendum)
  Tele Assessment   [name], 47 y.o., male patient presented to [facility] with complaints of agitated/aggressive behavior.  Patient seen via telepsych by this provider; chart reviewed and consulted with Dr. Dwyane Dee on 12/02/17.  On evaluation [name] reports that he is not an aggressive person.  "  When my stomach is hurting I do want anybody to come near me.  My sister kept coming towards me and I put my hands out (patient then held his hands out like he was pushing someone away) I put my hands are to keep her from coming near me to push away.  I did not choker."  Patient also states that he has not broken his father's door down.  "  I just pushed the door open."  Patient denies suicidal/self-harm/homicidal ideation, psychosis, and paranoia.  Patient denies any prior psychiatric history other than 2 months ago states he was inpatient for psychiatric treatment 2 months ago in [place] where he stayed for 2 days and was discharged.  Patient states he is supposed to be following up at day [facility] for outpatient psychiatric services but he has not gone.  Patient states that he is not violent and does not need anger management and then list about several medical problems he was having. During evaluation [name] is alert/oriented x 4; calm/cooperative; mood congruent with affect.  He did not appear to be responding to internal/external stimuli or delusional thoughts.  Patient denies suicidal/self-harm/homicidal ideation, psychosis, and paranoia.  Patient answered question appropriately.  Patient psychiatrically cleared  Recommendations: Patient psychiatrically cleared.  Patient to follow-up with [facility] for outpatient psychiatric services  Disposition: No evidence of imminent risk to self or others at present.   Patient does not meet criteria for psychiatric inpatient admission.  Spoke with Clayton Lefort, RN related to above recommendation/disposition.  She will inform EDP.  For  detailed note see TTS tele assessment note  [name], NP

## 2017-12-02 NOTE — Discharge Instructions (Signed)
You have been cleared to safely go home at this time but you will need to follow up with [facility] for further assistance with your anxiety and other behavioral issues.  Call for an appointment time.  In the meantime, return here for any worsened symptoms.
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
## 2017-12-02 NOTE — ED Notes (Signed)
Pt's sister coming to pick up pt.

## 2017-12-02 NOTE — ED Notes (Signed)
Pt eating part of his breakfast tray

## 2017-12-02 NOTE — ED Notes (Signed)
Dressing to right foot   C, D and I .  Date changed yesterday by home health nurse.  States he has some toes amputated.

## 2017-12-02 NOTE — ED Notes (Signed)
Per [facility].  Pt being cleared for psych.  Faxing over resources .

## 2017-12-03 ENCOUNTER — Emergency Department (HOSPITAL_COMMUNITY)
Admission: EM | Admit: 2017-12-03 | Discharge: 2017-12-03 | Disposition: A | Discharge status: Home / Self Care | Payer: Medicare HMO | Attending: Emergency Medicine | Admitting: Emergency Medicine

## 2017-12-03 ENCOUNTER — Encounter (HOSPITAL_COMMUNITY): Payer: Self-pay

## 2017-12-03 ENCOUNTER — Other Ambulatory Visit: Payer: Self-pay

## 2017-12-03 DIAGNOSIS — I160 Hypertensive urgency: Secondary | ICD-10-CM | POA: Diagnosis not present

## 2017-12-03 DIAGNOSIS — E1143 Type 2 diabetes mellitus with diabetic autonomic (poly)neuropathy: Secondary | ICD-10-CM | POA: Diagnosis not present

## 2017-12-03 DIAGNOSIS — R0789 Other chest pain: Secondary | ICD-10-CM | POA: Diagnosis not present

## 2017-12-03 DIAGNOSIS — E876 Hypokalemia: Secondary | ICD-10-CM | POA: Diagnosis not present

## 2017-12-03 DIAGNOSIS — E119 Type 2 diabetes mellitus without complications: Secondary | ICD-10-CM | POA: Insufficient documentation

## 2017-12-03 DIAGNOSIS — H409 Unspecified glaucoma: Secondary | ICD-10-CM | POA: Diagnosis not present

## 2017-12-03 DIAGNOSIS — I110 Hypertensive heart disease with heart failure: Secondary | ICD-10-CM | POA: Diagnosis not present

## 2017-12-03 DIAGNOSIS — R140 Abdominal distension (gaseous): Secondary | ICD-10-CM | POA: Diagnosis not present

## 2017-12-03 DIAGNOSIS — Z79899 Other long term (current) drug therapy: Secondary | ICD-10-CM | POA: Diagnosis not present

## 2017-12-03 DIAGNOSIS — R451 Restlessness and agitation: Secondary | ICD-10-CM

## 2017-12-03 DIAGNOSIS — E785 Hyperlipidemia, unspecified: Secondary | ICD-10-CM | POA: Diagnosis not present

## 2017-12-03 DIAGNOSIS — F419 Anxiety disorder, unspecified: Secondary | ICD-10-CM | POA: Diagnosis not present

## 2017-12-03 DIAGNOSIS — I200 Unstable angina: Secondary | ICD-10-CM | POA: Diagnosis not present

## 2017-12-03 DIAGNOSIS — I5030 Unspecified diastolic (congestive) heart failure: Secondary | ICD-10-CM | POA: Diagnosis not present

## 2017-12-03 DIAGNOSIS — Z794 Long term (current) use of insulin: Secondary | ICD-10-CM | POA: Diagnosis not present

## 2017-12-03 DIAGNOSIS — S0003XA Contusion of scalp, initial encounter: Secondary | ICD-10-CM | POA: Diagnosis not present

## 2017-12-03 DIAGNOSIS — I10 Essential (primary) hypertension: Secondary | ICD-10-CM | POA: Diagnosis not present

## 2017-12-03 DIAGNOSIS — K3184 Gastroparesis: Secondary | ICD-10-CM | POA: Diagnosis not present

## 2017-12-03 DIAGNOSIS — R531 Weakness: Secondary | ICD-10-CM | POA: Diagnosis not present

## 2017-12-03 DIAGNOSIS — I6789 Other cerebrovascular disease: Secondary | ICD-10-CM | POA: Diagnosis not present

## 2017-12-03 DIAGNOSIS — R079 Chest pain, unspecified: Secondary | ICD-10-CM | POA: Diagnosis not present

## 2017-12-03 NOTE — BH Assessment (Signed)
Provider called ED for PCP info on pt to assess what happened to bring pt back into ED from being D/C 12/02/17 and nurse reported to provider that pt had been d/c.

## 2017-12-03 NOTE — ED Triage Notes (Signed)
Per ems cbg 200, bp 178/98, hr 80, 02 sat 97% on room air.
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
## 2017-12-03 NOTE — ED Triage Notes (Signed)
EMS reports pt c/o abd pain. Pt went to pcp's office and became combative when a lot of people gathered around him.  Pt says he doesn't like a lot of people in his face.  Pt seen here recently for same.  Reports vomiting, no diarrhea.  LBM was today.  Reports urinary frequency last night.

## 2017-12-03 NOTE — Discharge Instructions (Addendum)
Follow-up with [facility] for mental health counseling.  Check your glucose on a regular basis.

## 2017-12-03 NOTE — ED Notes (Signed)
Pt would not let us take VS pt was out in the hall stating that he wanted to leave and the family was trying to talk him into staying to be seen by the doctor but the patient would not agree and continued wanting to leave.

## 2017-12-04 DIAGNOSIS — I10 Essential (primary) hypertension: Secondary | ICD-10-CM | POA: Diagnosis not present

## 2017-12-04 DIAGNOSIS — R079 Chest pain, unspecified: Secondary | ICD-10-CM | POA: Diagnosis not present

## 2017-12-04 DIAGNOSIS — E119 Type 2 diabetes mellitus without complications: Secondary | ICD-10-CM | POA: Diagnosis not present

## 2017-12-05 DIAGNOSIS — E876 Hypokalemia: Secondary | ICD-10-CM | POA: Diagnosis not present

## 2017-12-05 DIAGNOSIS — E119 Type 2 diabetes mellitus without complications: Secondary | ICD-10-CM | POA: Diagnosis not present

## 2017-12-05 DIAGNOSIS — H409 Unspecified glaucoma: Secondary | ICD-10-CM | POA: Diagnosis not present

## 2017-12-05 DIAGNOSIS — I10 Essential (primary) hypertension: Secondary | ICD-10-CM | POA: Diagnosis not present

## 2017-12-05 DIAGNOSIS — E1143 Type 2 diabetes mellitus with diabetic autonomic (poly)neuropathy: Secondary | ICD-10-CM | POA: Diagnosis not present

## 2017-12-05 DIAGNOSIS — I110 Hypertensive heart disease with heart failure: Secondary | ICD-10-CM | POA: Diagnosis not present

## 2017-12-05 DIAGNOSIS — I5030 Unspecified diastolic (congestive) heart failure: Secondary | ICD-10-CM | POA: Diagnosis not present

## 2017-12-05 DIAGNOSIS — K3184 Gastroparesis: Secondary | ICD-10-CM | POA: Diagnosis not present

## 2017-12-05 DIAGNOSIS — E785 Hyperlipidemia, unspecified: Secondary | ICD-10-CM | POA: Diagnosis not present

## 2017-12-05 DIAGNOSIS — I200 Unstable angina: Secondary | ICD-10-CM | POA: Diagnosis not present

## 2017-12-05 DIAGNOSIS — R079 Chest pain, unspecified: Secondary | ICD-10-CM | POA: Diagnosis not present

## 2017-12-05 DIAGNOSIS — R531 Weakness: Secondary | ICD-10-CM | POA: Diagnosis not present

## 2017-12-07 DIAGNOSIS — R0789 Other chest pain: Secondary | ICD-10-CM | POA: Diagnosis not present

## 2017-12-07 DIAGNOSIS — I309 Acute pericarditis, unspecified: Secondary | ICD-10-CM | POA: Diagnosis not present

## 2017-12-07 DIAGNOSIS — R079 Chest pain, unspecified: Secondary | ICD-10-CM | POA: Diagnosis not present

## 2017-12-07 DIAGNOSIS — I10 Essential (primary) hypertension: Secondary | ICD-10-CM | POA: Diagnosis not present

## 2017-12-07 DIAGNOSIS — E119 Type 2 diabetes mellitus without complications: Secondary | ICD-10-CM | POA: Diagnosis not present

## 2017-12-07 DIAGNOSIS — Z833 Family history of diabetes mellitus: Secondary | ICD-10-CM | POA: Diagnosis not present

## 2017-12-07 DIAGNOSIS — R197 Diarrhea, unspecified: Secondary | ICD-10-CM | POA: Diagnosis not present

## 2017-12-07 DIAGNOSIS — I313 Pericardial effusion (noninflammatory): Secondary | ICD-10-CM | POA: Diagnosis not present

## 2017-12-08 DIAGNOSIS — I70262 Atherosclerosis of native arteries of extremities with gangrene, left leg: Secondary | ICD-10-CM | POA: Diagnosis not present

## 2017-12-08 DIAGNOSIS — K3184 Gastroparesis: Secondary | ICD-10-CM | POA: Diagnosis not present

## 2017-12-08 DIAGNOSIS — I10 Essential (primary) hypertension: Secondary | ICD-10-CM | POA: Diagnosis not present

## 2017-12-08 DIAGNOSIS — K219 Gastro-esophageal reflux disease without esophagitis: Secondary | ICD-10-CM | POA: Diagnosis not present

## 2017-12-08 DIAGNOSIS — R066 Hiccough: Secondary | ICD-10-CM | POA: Diagnosis not present

## 2017-12-08 DIAGNOSIS — E1165 Type 2 diabetes mellitus with hyperglycemia: Secondary | ICD-10-CM | POA: Diagnosis not present

## 2017-12-08 DIAGNOSIS — F419 Anxiety disorder, unspecified: Secondary | ICD-10-CM | POA: Diagnosis not present

## 2017-12-08 DIAGNOSIS — I309 Acute pericarditis, unspecified: Secondary | ICD-10-CM | POA: Diagnosis not present

## 2017-12-08 DIAGNOSIS — E782 Mixed hyperlipidemia: Secondary | ICD-10-CM | POA: Diagnosis not present

## 2017-12-09 ENCOUNTER — Ambulatory Visit: Payer: Medicare HMO | Admitting: Podiatry

## 2017-12-09 ENCOUNTER — Encounter: Payer: Self-pay | Admitting: Podiatry

## 2017-12-09 DIAGNOSIS — L97512 Non-pressure chronic ulcer of other part of right foot with fat layer exposed: Secondary | ICD-10-CM

## 2017-12-09 DIAGNOSIS — E0842 Diabetes mellitus due to underlying condition with diabetic polyneuropathy: Secondary | ICD-10-CM

## 2017-12-09 DIAGNOSIS — I70235 Atherosclerosis of native arteries of right leg with ulceration of other part of foot: Secondary | ICD-10-CM | POA: Diagnosis not present

## 2017-12-11 DIAGNOSIS — K3184 Gastroparesis: Secondary | ICD-10-CM | POA: Diagnosis not present

## 2017-12-11 DIAGNOSIS — R6520 Severe sepsis without septic shock: Secondary | ICD-10-CM | POA: Diagnosis not present

## 2017-12-11 DIAGNOSIS — K219 Gastro-esophageal reflux disease without esophagitis: Secondary | ICD-10-CM | POA: Diagnosis not present

## 2017-12-11 DIAGNOSIS — E11621 Type 2 diabetes mellitus with foot ulcer: Secondary | ICD-10-CM | POA: Diagnosis not present

## 2017-12-11 DIAGNOSIS — R451 Restlessness and agitation: Secondary | ICD-10-CM | POA: Diagnosis not present

## 2017-12-11 DIAGNOSIS — I10 Essential (primary) hypertension: Secondary | ICD-10-CM | POA: Diagnosis not present

## 2017-12-14 NOTE — Progress Notes (Signed)
   Subjective:  Patient presents today status post partial first ray amputation right foot on 08/20/2017.  Patient also presents for follow-up evaluation regarding ulcerations to the right foot. He states the ulcers have not improved since the last visit. He reports a burning sensation to the dorsum of the right foot. He reports he was just discharged from the hospital. Patient is here for further evaluation and treatment.   Past Medical History:  Diagnosis Date  . Chronic abdominal pain   . Esophagitis   . Eye hemorrhage   . Gastritis   . Gastroparesis   . GERD (gastroesophageal reflux disease)   . Glaucoma   . Helicobacter pylori (H. pylori) infection 2016   ERADICATION DOCUMENTED VIA STOOL TEST in AUG 2016 at [place]  . Hiccups   . Hypertension   . Nausea and vomiting    chronic, recurrent  . Type 2 diabetes mellitus with complications (HCC)    diagnosed around age 4     Objective: Physical Exam General: The patient is alert and oriented x3 in no acute distress.  Ischemic ulcerations with overlying eschar noted to the dorsum and lateral aspect of the right foot secondary to tight dressing wraps.  Dorsomedial ulceration measures approximately 4.0 x 3.5 x 0.1 cm (LxWxD).  The lateral ulceration measures approximately 8.5 x 3.0 x 0.2 cm (LxWxD).  To the noted ulcerations there is a well adhered eschar.  Negative for any significant drainage.  Negative for malodor.  Purulent integrity is intact.  Vascular: Diminished pedal pulses bilaterally. there is some moderate edema noted to the foot.   Neurological: Epicritic and protective threshold absent bilaterally.   Musculoskeletal Exam: Well-healing partial first ray amputation right foot  Assessment: 1. s/p partial first ray amputation right. DOS: 08/20/17 2.  Ischemic gangrenous ulcerations right foot 3.  Diabetes mellitus with neuropathy 4.  Peripheral vascular disease   Plan of Care:  1. Patient was evaluated.   2.  Medically necessary excisional debridement including muscle and deep fascial tissue was performed using a tissue nipper and a chisel blade. Excisional debridement of all the necrotic nonviable tissue down to healthy bleeding viable tissue was performed with post-debridement measurements same as pre-. 3. The wound was cleansed and dry sterile dressing applied. 4. Continue home health care iodine and dry sterile dressing.  5. Post op shoe dispensed.  6. Return to clinic in 3 weeks.    Edrick Kins, DPM [facility]  Dr. Edrick Kins, [address]                                        [place], [place] [address]                Office [phone]  Fax [phone]

## 2017-12-15 DIAGNOSIS — K3184 Gastroparesis: Secondary | ICD-10-CM | POA: Diagnosis not present

## 2017-12-15 DIAGNOSIS — E11621 Type 2 diabetes mellitus with foot ulcer: Secondary | ICD-10-CM | POA: Diagnosis not present

## 2017-12-15 DIAGNOSIS — I10 Essential (primary) hypertension: Secondary | ICD-10-CM | POA: Diagnosis not present

## 2017-12-15 DIAGNOSIS — R451 Restlessness and agitation: Secondary | ICD-10-CM | POA: Diagnosis not present

## 2017-12-15 DIAGNOSIS — R6520 Severe sepsis without septic shock: Secondary | ICD-10-CM | POA: Diagnosis not present

## 2017-12-15 DIAGNOSIS — K219 Gastro-esophageal reflux disease without esophagitis: Secondary | ICD-10-CM | POA: Diagnosis not present

## 2017-12-18 DIAGNOSIS — E11621 Type 2 diabetes mellitus with foot ulcer: Secondary | ICD-10-CM | POA: Diagnosis not present

## 2017-12-18 DIAGNOSIS — R6520 Severe sepsis without septic shock: Secondary | ICD-10-CM | POA: Diagnosis not present

## 2017-12-18 DIAGNOSIS — K3184 Gastroparesis: Secondary | ICD-10-CM | POA: Diagnosis not present

## 2017-12-18 DIAGNOSIS — I10 Essential (primary) hypertension: Secondary | ICD-10-CM | POA: Diagnosis not present

## 2017-12-18 DIAGNOSIS — K219 Gastro-esophageal reflux disease without esophagitis: Secondary | ICD-10-CM | POA: Diagnosis not present

## 2017-12-18 DIAGNOSIS — R451 Restlessness and agitation: Secondary | ICD-10-CM | POA: Diagnosis not present

## 2017-12-19 DIAGNOSIS — I161 Hypertensive emergency: Secondary | ICD-10-CM | POA: Diagnosis not present

## 2017-12-19 DIAGNOSIS — E1122 Type 2 diabetes mellitus with diabetic chronic kidney disease: Secondary | ICD-10-CM | POA: Diagnosis not present

## 2017-12-19 DIAGNOSIS — R51 Headache: Secondary | ICD-10-CM | POA: Diagnosis not present

## 2017-12-19 DIAGNOSIS — K3184 Gastroparesis: Secondary | ICD-10-CM | POA: Diagnosis not present

## 2017-12-19 DIAGNOSIS — N179 Acute kidney failure, unspecified: Secondary | ICD-10-CM | POA: Diagnosis not present

## 2017-12-19 DIAGNOSIS — I160 Hypertensive urgency: Secondary | ICD-10-CM | POA: Diagnosis not present

## 2017-12-19 DIAGNOSIS — R252 Cramp and spasm: Secondary | ICD-10-CM | POA: Diagnosis not present

## 2017-12-19 DIAGNOSIS — S91301A Unspecified open wound, right foot, initial encounter: Secondary | ICD-10-CM | POA: Diagnosis not present

## 2017-12-19 DIAGNOSIS — N189 Chronic kidney disease, unspecified: Secondary | ICD-10-CM | POA: Diagnosis not present

## 2017-12-19 DIAGNOSIS — I129 Hypertensive chronic kidney disease with stage 1 through stage 4 chronic kidney disease, or unspecified chronic kidney disease: Secondary | ICD-10-CM | POA: Diagnosis not present

## 2017-12-19 DIAGNOSIS — E876 Hypokalemia: Secondary | ICD-10-CM | POA: Diagnosis not present

## 2017-12-19 DIAGNOSIS — H409 Unspecified glaucoma: Secondary | ICD-10-CM | POA: Diagnosis not present

## 2017-12-19 DIAGNOSIS — K219 Gastro-esophageal reflux disease without esophagitis: Secondary | ICD-10-CM | POA: Diagnosis not present

## 2017-12-20 DIAGNOSIS — E876 Hypokalemia: Secondary | ICD-10-CM | POA: Diagnosis not present

## 2017-12-20 DIAGNOSIS — K3184 Gastroparesis: Secondary | ICD-10-CM | POA: Diagnosis not present

## 2017-12-20 DIAGNOSIS — H409 Unspecified glaucoma: Secondary | ICD-10-CM | POA: Diagnosis not present

## 2017-12-20 DIAGNOSIS — K219 Gastro-esophageal reflux disease without esophagitis: Secondary | ICD-10-CM | POA: Diagnosis not present

## 2017-12-20 DIAGNOSIS — N179 Acute kidney failure, unspecified: Secondary | ICD-10-CM | POA: Diagnosis not present

## 2017-12-20 DIAGNOSIS — N189 Chronic kidney disease, unspecified: Secondary | ICD-10-CM | POA: Diagnosis not present

## 2017-12-20 DIAGNOSIS — I129 Hypertensive chronic kidney disease with stage 1 through stage 4 chronic kidney disease, or unspecified chronic kidney disease: Secondary | ICD-10-CM | POA: Diagnosis not present

## 2017-12-20 DIAGNOSIS — I160 Hypertensive urgency: Secondary | ICD-10-CM | POA: Diagnosis not present

## 2017-12-20 DIAGNOSIS — E1122 Type 2 diabetes mellitus with diabetic chronic kidney disease: Secondary | ICD-10-CM | POA: Diagnosis not present

## 2017-12-21 DIAGNOSIS — H409 Unspecified glaucoma: Secondary | ICD-10-CM | POA: Diagnosis not present

## 2017-12-21 DIAGNOSIS — E876 Hypokalemia: Secondary | ICD-10-CM | POA: Diagnosis not present

## 2017-12-21 DIAGNOSIS — K3184 Gastroparesis: Secondary | ICD-10-CM | POA: Diagnosis not present

## 2017-12-21 DIAGNOSIS — I129 Hypertensive chronic kidney disease with stage 1 through stage 4 chronic kidney disease, or unspecified chronic kidney disease: Secondary | ICD-10-CM | POA: Diagnosis not present

## 2017-12-21 DIAGNOSIS — K219 Gastro-esophageal reflux disease without esophagitis: Secondary | ICD-10-CM | POA: Diagnosis not present

## 2017-12-21 DIAGNOSIS — E1122 Type 2 diabetes mellitus with diabetic chronic kidney disease: Secondary | ICD-10-CM | POA: Diagnosis not present

## 2017-12-21 DIAGNOSIS — I160 Hypertensive urgency: Secondary | ICD-10-CM | POA: Diagnosis not present

## 2017-12-21 DIAGNOSIS — N179 Acute kidney failure, unspecified: Secondary | ICD-10-CM | POA: Diagnosis not present

## 2017-12-21 DIAGNOSIS — N189 Chronic kidney disease, unspecified: Secondary | ICD-10-CM | POA: Diagnosis not present

## 2017-12-22 DIAGNOSIS — I10 Essential (primary) hypertension: Secondary | ICD-10-CM | POA: Diagnosis not present

## 2017-12-22 DIAGNOSIS — E11621 Type 2 diabetes mellitus with foot ulcer: Secondary | ICD-10-CM | POA: Diagnosis not present

## 2017-12-22 DIAGNOSIS — K3184 Gastroparesis: Secondary | ICD-10-CM | POA: Diagnosis not present

## 2017-12-22 DIAGNOSIS — R451 Restlessness and agitation: Secondary | ICD-10-CM | POA: Diagnosis not present

## 2017-12-22 DIAGNOSIS — K219 Gastro-esophageal reflux disease without esophagitis: Secondary | ICD-10-CM | POA: Diagnosis not present

## 2017-12-22 DIAGNOSIS — R6520 Severe sepsis without septic shock: Secondary | ICD-10-CM | POA: Diagnosis not present

## 2017-12-23 ENCOUNTER — Ambulatory Visit: Payer: Medicare HMO | Admitting: Podiatry

## 2017-12-23 ENCOUNTER — Encounter: Payer: Self-pay | Admitting: Podiatry

## 2017-12-23 DIAGNOSIS — E0842 Diabetes mellitus due to underlying condition with diabetic polyneuropathy: Secondary | ICD-10-CM

## 2017-12-23 DIAGNOSIS — L97512 Non-pressure chronic ulcer of other part of right foot with fat layer exposed: Secondary | ICD-10-CM

## 2017-12-23 DIAGNOSIS — I70235 Atherosclerosis of native arteries of right leg with ulceration of other part of foot: Secondary | ICD-10-CM | POA: Diagnosis not present

## 2017-12-25 ENCOUNTER — Other Ambulatory Visit: Payer: Self-pay | Admitting: Infectious Diseases

## 2017-12-25 DIAGNOSIS — R451 Restlessness and agitation: Secondary | ICD-10-CM | POA: Diagnosis not present

## 2017-12-25 DIAGNOSIS — R6520 Severe sepsis without septic shock: Secondary | ICD-10-CM | POA: Diagnosis not present

## 2017-12-25 DIAGNOSIS — I10 Essential (primary) hypertension: Secondary | ICD-10-CM | POA: Diagnosis not present

## 2017-12-25 DIAGNOSIS — E11621 Type 2 diabetes mellitus with foot ulcer: Secondary | ICD-10-CM | POA: Diagnosis not present

## 2017-12-25 DIAGNOSIS — K219 Gastro-esophageal reflux disease without esophagitis: Secondary | ICD-10-CM | POA: Diagnosis not present

## 2017-12-25 DIAGNOSIS — K3184 Gastroparesis: Secondary | ICD-10-CM | POA: Diagnosis not present

## 2017-12-28 DIAGNOSIS — E1165 Type 2 diabetes mellitus with hyperglycemia: Secondary | ICD-10-CM | POA: Diagnosis not present

## 2017-12-28 DIAGNOSIS — K3184 Gastroparesis: Secondary | ICD-10-CM | POA: Diagnosis not present

## 2017-12-28 DIAGNOSIS — E782 Mixed hyperlipidemia: Secondary | ICD-10-CM | POA: Diagnosis not present

## 2017-12-28 DIAGNOSIS — R451 Restlessness and agitation: Secondary | ICD-10-CM | POA: Diagnosis not present

## 2017-12-28 DIAGNOSIS — R6520 Severe sepsis without septic shock: Secondary | ICD-10-CM | POA: Diagnosis not present

## 2017-12-28 DIAGNOSIS — I10 Essential (primary) hypertension: Secondary | ICD-10-CM | POA: Diagnosis not present

## 2017-12-28 DIAGNOSIS — K219 Gastro-esophageal reflux disease without esophagitis: Secondary | ICD-10-CM | POA: Diagnosis not present

## 2017-12-28 DIAGNOSIS — E11621 Type 2 diabetes mellitus with foot ulcer: Secondary | ICD-10-CM | POA: Diagnosis not present

## 2017-12-28 NOTE — Progress Notes (Signed)
   Subjective:  Patient presents today status post partial first ray amputation right foot on 08/20/2017.  Patient also presents for follow-up evaluation regarding ulcerations to the right foot. He states the wounds look well. A nurse is still coming to his home twice per week to change the dressings. Patient is here for further evaluation and treatment.   Past Medical History:  Diagnosis Date  . Chronic abdominal pain   . Esophagitis   . Eye hemorrhage   . Gastritis   . Gastroparesis   . GERD (gastroesophageal reflux disease)   . Glaucoma   . Helicobacter pylori (H. pylori) infection 2016   ERADICATION DOCUMENTED VIA STOOL TEST in AUG 2016 at [place]  . Hiccups   . Hypertension   . Nausea and vomiting    chronic, recurrent  . Type 2 diabetes mellitus with complications (HCC)    diagnosed around age 73     Objective: Physical Exam General: The patient is alert and oriented x3 in no acute distress.  Ischemic ulcerations with overlying eschar noted to the dorsum and lateral aspect of the right foot secondary to tight dressing wraps. Dorsomedial ulceration measures approximately 3.8 x 3.3 x 0.2 cm (LxWxD).  The lateral ulceration measures approximately 8.5 x 3.0 x 0.2 cm (LxWxD).  To the noted ulcerations there is a well adhered eschar.  Negative for any significant drainage.  Negative for malodor.  Purulent integrity is intact.  Vascular: Diminished pedal pulses bilaterally. There is some moderate edema noted to the foot.   Neurological: Epicritic and protective threshold absent bilaterally.   Musculoskeletal Exam: Well-healing partial first ray amputation right foot  Assessment: 1. s/p partial first ray amputation right. DOS: 08/20/17 2.  Ischemic gangrenous ulcerations right foot - improved  3.  Diabetes mellitus with neuropathy 4.  Peripheral vascular disease   Plan of Care:  1. Patient was evaluated.   2. Medically necessary excisional debridement including muscle and  deep fascial tissue was performed using a tissue nipper and a chisel blade. Excisional debridement of all the necrotic nonviable tissue down to healthy bleeding viable tissue was performed with post-debridement measurements same as pre-. 3. The wound was cleansed and dry sterile dressing applied. 4. Continue home health care iodine and dry sterile dressing twice per week.  5. Continue wearing post op shoe.  6. Return to clinic in 3 weeks.    Edrick Kins, DPM [facility]  Dr. Edrick Kins, [place]                                        [place], [place] [address]                Office [phone]  Fax [phone]

## 2018-01-01 DIAGNOSIS — F419 Anxiety disorder, unspecified: Secondary | ICD-10-CM | POA: Diagnosis not present

## 2018-01-01 DIAGNOSIS — I10 Essential (primary) hypertension: Secondary | ICD-10-CM | POA: Diagnosis not present

## 2018-01-01 DIAGNOSIS — Z0000 Encounter for general adult medical examination without abnormal findings: Secondary | ICD-10-CM | POA: Diagnosis not present

## 2018-01-01 DIAGNOSIS — E1165 Type 2 diabetes mellitus with hyperglycemia: Secondary | ICD-10-CM | POA: Diagnosis not present

## 2018-01-01 DIAGNOSIS — R6520 Severe sepsis without septic shock: Secondary | ICD-10-CM | POA: Diagnosis not present

## 2018-01-01 DIAGNOSIS — E876 Hypokalemia: Secondary | ICD-10-CM | POA: Diagnosis not present

## 2018-01-01 DIAGNOSIS — K219 Gastro-esophageal reflux disease without esophagitis: Secondary | ICD-10-CM | POA: Diagnosis not present

## 2018-01-01 DIAGNOSIS — R451 Restlessness and agitation: Secondary | ICD-10-CM | POA: Diagnosis not present

## 2018-01-01 DIAGNOSIS — R809 Proteinuria, unspecified: Secondary | ICD-10-CM | POA: Diagnosis not present

## 2018-01-01 DIAGNOSIS — N189 Chronic kidney disease, unspecified: Secondary | ICD-10-CM | POA: Diagnosis not present

## 2018-01-01 DIAGNOSIS — E11621 Type 2 diabetes mellitus with foot ulcer: Secondary | ICD-10-CM | POA: Diagnosis not present

## 2018-01-01 DIAGNOSIS — Z89429 Acquired absence of other toe(s), unspecified side: Secondary | ICD-10-CM | POA: Diagnosis not present

## 2018-01-01 DIAGNOSIS — K3184 Gastroparesis: Secondary | ICD-10-CM | POA: Diagnosis not present

## 2018-01-05 DIAGNOSIS — E11621 Type 2 diabetes mellitus with foot ulcer: Secondary | ICD-10-CM | POA: Diagnosis not present

## 2018-01-05 DIAGNOSIS — R6520 Severe sepsis without septic shock: Secondary | ICD-10-CM | POA: Diagnosis not present

## 2018-01-05 DIAGNOSIS — K3184 Gastroparesis: Secondary | ICD-10-CM | POA: Diagnosis not present

## 2018-01-05 DIAGNOSIS — K219 Gastro-esophageal reflux disease without esophagitis: Secondary | ICD-10-CM | POA: Diagnosis not present

## 2018-01-05 DIAGNOSIS — R451 Restlessness and agitation: Secondary | ICD-10-CM | POA: Diagnosis not present

## 2018-01-05 DIAGNOSIS — I10 Essential (primary) hypertension: Secondary | ICD-10-CM | POA: Diagnosis not present

## 2018-01-06 DIAGNOSIS — G4733 Obstructive sleep apnea (adult) (pediatric): Secondary | ICD-10-CM | POA: Diagnosis not present

## 2018-01-06 DIAGNOSIS — H548 Legal blindness, as defined in USA: Secondary | ICD-10-CM | POA: Diagnosis not present

## 2018-01-06 DIAGNOSIS — I5032 Chronic diastolic (congestive) heart failure: Secondary | ICD-10-CM | POA: Diagnosis not present

## 2018-01-06 DIAGNOSIS — K3184 Gastroparesis: Secondary | ICD-10-CM | POA: Diagnosis not present

## 2018-01-06 DIAGNOSIS — I130 Hypertensive heart and chronic kidney disease with heart failure and stage 1 through stage 4 chronic kidney disease, or unspecified chronic kidney disease: Secondary | ICD-10-CM | POA: Diagnosis not present

## 2018-01-06 DIAGNOSIS — L97519 Non-pressure chronic ulcer of other part of right foot with unspecified severity: Secondary | ICD-10-CM | POA: Diagnosis not present

## 2018-01-06 DIAGNOSIS — E1122 Type 2 diabetes mellitus with diabetic chronic kidney disease: Secondary | ICD-10-CM | POA: Diagnosis not present

## 2018-01-06 DIAGNOSIS — N182 Chronic kidney disease, stage 2 (mild): Secondary | ICD-10-CM | POA: Diagnosis not present

## 2018-01-06 DIAGNOSIS — E11621 Type 2 diabetes mellitus with foot ulcer: Secondary | ICD-10-CM | POA: Diagnosis not present

## 2018-01-07 DIAGNOSIS — E11621 Type 2 diabetes mellitus with foot ulcer: Secondary | ICD-10-CM | POA: Diagnosis not present

## 2018-01-08 DIAGNOSIS — E1143 Type 2 diabetes mellitus with diabetic autonomic (poly)neuropathy: Secondary | ICD-10-CM | POA: Diagnosis not present

## 2018-01-08 DIAGNOSIS — R079 Chest pain, unspecified: Secondary | ICD-10-CM | POA: Diagnosis not present

## 2018-01-08 DIAGNOSIS — I160 Hypertensive urgency: Secondary | ICD-10-CM | POA: Diagnosis not present

## 2018-01-08 DIAGNOSIS — Z79899 Other long term (current) drug therapy: Secondary | ICD-10-CM | POA: Diagnosis not present

## 2018-01-08 DIAGNOSIS — K3184 Gastroparesis: Secondary | ICD-10-CM | POA: Diagnosis not present

## 2018-01-08 DIAGNOSIS — R0789 Other chest pain: Secondary | ICD-10-CM | POA: Diagnosis not present

## 2018-01-08 DIAGNOSIS — E669 Obesity, unspecified: Secondary | ICD-10-CM | POA: Diagnosis not present

## 2018-01-08 DIAGNOSIS — I10 Essential (primary) hypertension: Secondary | ICD-10-CM | POA: Diagnosis not present

## 2018-01-08 DIAGNOSIS — G43A0 Cyclical vomiting, not intractable: Secondary | ICD-10-CM | POA: Diagnosis not present

## 2018-01-08 DIAGNOSIS — W19XXXA Unspecified fall, initial encounter: Secondary | ICD-10-CM | POA: Diagnosis not present

## 2018-01-08 DIAGNOSIS — Z833 Family history of diabetes mellitus: Secondary | ICD-10-CM | POA: Diagnosis not present

## 2018-01-08 DIAGNOSIS — Z794 Long term (current) use of insulin: Secondary | ICD-10-CM | POA: Diagnosis not present

## 2018-01-11 DIAGNOSIS — N182 Chronic kidney disease, stage 2 (mild): Secondary | ICD-10-CM | POA: Diagnosis not present

## 2018-01-11 DIAGNOSIS — L97519 Non-pressure chronic ulcer of other part of right foot with unspecified severity: Secondary | ICD-10-CM | POA: Diagnosis not present

## 2018-01-11 DIAGNOSIS — I5032 Chronic diastolic (congestive) heart failure: Secondary | ICD-10-CM | POA: Diagnosis not present

## 2018-01-11 DIAGNOSIS — E1122 Type 2 diabetes mellitus with diabetic chronic kidney disease: Secondary | ICD-10-CM | POA: Diagnosis not present

## 2018-01-11 DIAGNOSIS — I130 Hypertensive heart and chronic kidney disease with heart failure and stage 1 through stage 4 chronic kidney disease, or unspecified chronic kidney disease: Secondary | ICD-10-CM | POA: Diagnosis not present

## 2018-01-11 DIAGNOSIS — E11621 Type 2 diabetes mellitus with foot ulcer: Secondary | ICD-10-CM | POA: Diagnosis not present

## 2018-01-12 DIAGNOSIS — R809 Proteinuria, unspecified: Secondary | ICD-10-CM | POA: Diagnosis not present

## 2018-01-12 DIAGNOSIS — E876 Hypokalemia: Secondary | ICD-10-CM | POA: Diagnosis not present

## 2018-01-12 DIAGNOSIS — I70262 Atherosclerosis of native arteries of extremities with gangrene, left leg: Secondary | ICD-10-CM | POA: Diagnosis not present

## 2018-01-12 DIAGNOSIS — D509 Iron deficiency anemia, unspecified: Secondary | ICD-10-CM | POA: Diagnosis not present

## 2018-01-12 DIAGNOSIS — E1122 Type 2 diabetes mellitus with diabetic chronic kidney disease: Secondary | ICD-10-CM | POA: Diagnosis not present

## 2018-01-12 DIAGNOSIS — D519 Vitamin B12 deficiency anemia, unspecified: Secondary | ICD-10-CM | POA: Diagnosis not present

## 2018-01-12 DIAGNOSIS — Z89429 Acquired absence of other toe(s), unspecified side: Secondary | ICD-10-CM | POA: Diagnosis not present

## 2018-01-12 DIAGNOSIS — I10 Essential (primary) hypertension: Secondary | ICD-10-CM | POA: Diagnosis not present

## 2018-01-12 DIAGNOSIS — R0602 Shortness of breath: Secondary | ICD-10-CM | POA: Diagnosis not present

## 2018-01-12 DIAGNOSIS — N189 Chronic kidney disease, unspecified: Secondary | ICD-10-CM | POA: Diagnosis not present

## 2018-01-12 DIAGNOSIS — R601 Generalized edema: Secondary | ICD-10-CM | POA: Diagnosis not present

## 2018-01-13 ENCOUNTER — Ambulatory Visit (INDEPENDENT_AMBULATORY_CARE_PROVIDER_SITE_OTHER): Payer: Medicare HMO | Admitting: Podiatry

## 2018-01-13 DIAGNOSIS — I70235 Atherosclerosis of native arteries of right leg with ulceration of other part of foot: Secondary | ICD-10-CM | POA: Diagnosis not present

## 2018-01-13 DIAGNOSIS — L97512 Non-pressure chronic ulcer of other part of right foot with fat layer exposed: Secondary | ICD-10-CM

## 2018-01-13 DIAGNOSIS — E0842 Diabetes mellitus due to underlying condition with diabetic polyneuropathy: Secondary | ICD-10-CM

## 2018-01-13 NOTE — Progress Notes (Signed)
   Subjective:  Patient presents today for follow-up evaluation and treatment regarding ulcers to the right foot.  Patient has a history of partial first ray amputation which is healed.  Ulcers originally began when the patient was in the hospital and he states that the dressings were wrapped too tight around his foot and he developed ischemic ulcers.  Wound management has been ongoing with home health dressing changes 2 times per week.  He presents for follow-up treatment evaluation  Past Medical History:  Diagnosis Date  . Chronic abdominal pain   . Esophagitis   . Eye hemorrhage   . Gastritis   . Gastroparesis   . GERD (gastroesophageal reflux disease)   . Glaucoma   . Helicobacter pylori (H. pylori) infection 2016   ERADICATION DOCUMENTED VIA STOOL TEST in AUG 2016 at [place]  . Hiccups   . Hypertension   . Nausea and vomiting    chronic, recurrent  . Type 2 diabetes mellitus with complications (HCC)    diagnosed around age 23         Objective: Physical Exam General: The patient is alert and oriented x3 in no acute distress.  Derm: Ulcer noted to the lateral aspect of the right foot 6.0 x 3.0 x 1.0 cm.   Ulcer noted to the dorsal aspect of the right foot measuring approximately 4.0 x 6.0 x 0.2 cm.  To the noted ulceration there is no eschar there is a moderate amount of slough fibrin necrotic tissue noted.  Granular wound base is red.  There is no exposed bone muscle tendon ligament or joint.  There is no odor.  There is a moderate amount of serosanguineous drainage noted.  Periwound integrity is intact.  Vascular: Diminished pedal pulses bilaterally. There is some moderate edema noted to the foot.   Neurological: Epicritic and protective threshold absent bilaterally.   Musculoskeletal Exam: Well-healing partial first ray amputation right foot  Assessment: 1. s/p partial first ray amputation right. DOS: 08/20/17 2.  Ulcers right foot secondary to diabetes  mellitus  Plan of Care:  1. Patient was evaluated.   2. Medically necessary excisional debridement including muscle and deep fascial tissue was performed using a tissue nipper and a chisel blade. Excisional debridement of all the necrotic nonviable tissue down to healthy bleeding viable tissue was performed with post-debridement measurements same as pre-. 3. The wound was cleansed and dry sterile dressing applied. 4. Continue home health care iodine and dry sterile dressing twice per week.  We are going to place new orders today to apply negative pressure wound VAC to the lateral portion of the right foot.  Orders will be placed and home health agency should be contacted to initiate therapy. 5.  Recommend Prisma collagen dressing with dry sterile dressing to the dorsal wound. 6. Return to clinic in 3 weeks.    Edrick Kins, DPM [facility]  Dr. Edrick Kins, [place]                                        [place], [place] [address]                Office [phone]  Fax [phone]

## 2018-01-14 ENCOUNTER — Telehealth: Payer: Self-pay | Admitting: *Deleted

## 2018-01-14 DIAGNOSIS — I130 Hypertensive heart and chronic kidney disease with heart failure and stage 1 through stage 4 chronic kidney disease, or unspecified chronic kidney disease: Secondary | ICD-10-CM | POA: Diagnosis not present

## 2018-01-14 DIAGNOSIS — E11621 Type 2 diabetes mellitus with foot ulcer: Secondary | ICD-10-CM | POA: Diagnosis not present

## 2018-01-14 DIAGNOSIS — L97519 Non-pressure chronic ulcer of other part of right foot with unspecified severity: Secondary | ICD-10-CM | POA: Diagnosis not present

## 2018-01-14 DIAGNOSIS — E1122 Type 2 diabetes mellitus with diabetic chronic kidney disease: Secondary | ICD-10-CM | POA: Diagnosis not present

## 2018-01-14 DIAGNOSIS — N182 Chronic kidney disease, stage 2 (mild): Secondary | ICD-10-CM | POA: Diagnosis not present

## 2018-01-14 DIAGNOSIS — I5032 Chronic diastolic (congestive) heart failure: Secondary | ICD-10-CM | POA: Diagnosis not present

## 2018-01-14 NOTE — Telephone Encounter (Signed)
-----   Message from Edrick Kins, DPM sent at 01/13/2018 11:50 AM EDT ----- Regarding: Wound Care orders This patient actually has 2 wounds.  Lateral wound right foot: Wound VAC Dorsal wound right foot: Prisma collagen with dry sterile dressing.  Thanks, Dr. Amalia Hailey

## 2018-01-14 NOTE — Telephone Encounter (Signed)
Orders and forms faxed to [facility]

## 2018-01-14 NOTE — Telephone Encounter (Signed)
Faxed required form, clinicals, orders and demographics to [facility]. Faxed required forms x2, clinical and demographics to [facility].

## 2018-01-14 NOTE — Telephone Encounter (Signed)
-----   Message from Edrick Kins, DPM sent at 01/13/2018 11:45 AM EDT ----- Regarding: wound vac Please see if we can get the patient authorized for a wound vac RT foot.   Dx: DFU RT foot.   Thanks, Dr. Amalia Hailey

## 2018-01-19 DIAGNOSIS — I130 Hypertensive heart and chronic kidney disease with heart failure and stage 1 through stage 4 chronic kidney disease, or unspecified chronic kidney disease: Secondary | ICD-10-CM | POA: Diagnosis not present

## 2018-01-19 DIAGNOSIS — L97519 Non-pressure chronic ulcer of other part of right foot with unspecified severity: Secondary | ICD-10-CM | POA: Diagnosis not present

## 2018-01-19 DIAGNOSIS — I5032 Chronic diastolic (congestive) heart failure: Secondary | ICD-10-CM | POA: Diagnosis not present

## 2018-01-19 DIAGNOSIS — E1122 Type 2 diabetes mellitus with diabetic chronic kidney disease: Secondary | ICD-10-CM | POA: Diagnosis not present

## 2018-01-19 DIAGNOSIS — N182 Chronic kidney disease, stage 2 (mild): Secondary | ICD-10-CM | POA: Diagnosis not present

## 2018-01-19 DIAGNOSIS — E11621 Type 2 diabetes mellitus with foot ulcer: Secondary | ICD-10-CM | POA: Diagnosis not present

## 2018-01-22 DIAGNOSIS — L97519 Non-pressure chronic ulcer of other part of right foot with unspecified severity: Secondary | ICD-10-CM | POA: Diagnosis not present

## 2018-01-22 DIAGNOSIS — I130 Hypertensive heart and chronic kidney disease with heart failure and stage 1 through stage 4 chronic kidney disease, or unspecified chronic kidney disease: Secondary | ICD-10-CM | POA: Diagnosis not present

## 2018-01-22 DIAGNOSIS — I5032 Chronic diastolic (congestive) heart failure: Secondary | ICD-10-CM | POA: Diagnosis not present

## 2018-01-22 DIAGNOSIS — E1122 Type 2 diabetes mellitus with diabetic chronic kidney disease: Secondary | ICD-10-CM | POA: Diagnosis not present

## 2018-01-22 DIAGNOSIS — E11621 Type 2 diabetes mellitus with foot ulcer: Secondary | ICD-10-CM | POA: Diagnosis not present

## 2018-01-22 DIAGNOSIS — N182 Chronic kidney disease, stage 2 (mild): Secondary | ICD-10-CM | POA: Diagnosis not present

## 2018-01-25 DIAGNOSIS — N182 Chronic kidney disease, stage 2 (mild): Secondary | ICD-10-CM | POA: Diagnosis not present

## 2018-01-25 DIAGNOSIS — I10 Essential (primary) hypertension: Secondary | ICD-10-CM | POA: Diagnosis not present

## 2018-01-25 DIAGNOSIS — Z833 Family history of diabetes mellitus: Secondary | ICD-10-CM | POA: Diagnosis not present

## 2018-01-25 DIAGNOSIS — I5032 Chronic diastolic (congestive) heart failure: Secondary | ICD-10-CM | POA: Diagnosis not present

## 2018-01-25 DIAGNOSIS — K3184 Gastroparesis: Secondary | ICD-10-CM | POA: Diagnosis not present

## 2018-01-25 DIAGNOSIS — Z794 Long term (current) use of insulin: Secondary | ICD-10-CM | POA: Diagnosis not present

## 2018-01-25 DIAGNOSIS — E1143 Type 2 diabetes mellitus with diabetic autonomic (poly)neuropathy: Secondary | ICD-10-CM | POA: Diagnosis not present

## 2018-01-25 DIAGNOSIS — Z79899 Other long term (current) drug therapy: Secondary | ICD-10-CM | POA: Diagnosis not present

## 2018-01-25 DIAGNOSIS — L97519 Non-pressure chronic ulcer of other part of right foot with unspecified severity: Secondary | ICD-10-CM | POA: Diagnosis not present

## 2018-01-25 DIAGNOSIS — R609 Edema, unspecified: Secondary | ICD-10-CM | POA: Diagnosis not present

## 2018-01-25 DIAGNOSIS — I130 Hypertensive heart and chronic kidney disease with heart failure and stage 1 through stage 4 chronic kidney disease, or unspecified chronic kidney disease: Secondary | ICD-10-CM | POA: Diagnosis not present

## 2018-01-25 DIAGNOSIS — E1122 Type 2 diabetes mellitus with diabetic chronic kidney disease: Secondary | ICD-10-CM | POA: Diagnosis not present

## 2018-01-25 DIAGNOSIS — E11621 Type 2 diabetes mellitus with foot ulcer: Secondary | ICD-10-CM | POA: Diagnosis not present

## 2018-01-25 DIAGNOSIS — R0602 Shortness of breath: Secondary | ICD-10-CM | POA: Diagnosis not present

## 2018-01-27 ENCOUNTER — Telehealth: Payer: Self-pay | Admitting: Podiatry

## 2018-01-28 NOTE — Telephone Encounter (Signed)
Timothy Clark - [facility] states his associate has not been able to contact pt for the wound vac, with the current phone number they have and the [facility] insurance ID is incorrect. I gave Henrene Pastor the phone number and [employer] ID listed 12/01/2017 I currently have for pt.

## 2018-01-28 NOTE — Telephone Encounter (Signed)
This message is for Janett Billow. This is regarding Mr. Binion and negative wound pressure therapy. Please call me back at [phone]. Thank you.

## 2018-01-29 DIAGNOSIS — I130 Hypertensive heart and chronic kidney disease with heart failure and stage 1 through stage 4 chronic kidney disease, or unspecified chronic kidney disease: Secondary | ICD-10-CM | POA: Diagnosis not present

## 2018-01-29 DIAGNOSIS — E1122 Type 2 diabetes mellitus with diabetic chronic kidney disease: Secondary | ICD-10-CM | POA: Diagnosis not present

## 2018-01-29 DIAGNOSIS — N182 Chronic kidney disease, stage 2 (mild): Secondary | ICD-10-CM | POA: Diagnosis not present

## 2018-01-29 DIAGNOSIS — E11621 Type 2 diabetes mellitus with foot ulcer: Secondary | ICD-10-CM | POA: Diagnosis not present

## 2018-01-29 DIAGNOSIS — I5032 Chronic diastolic (congestive) heart failure: Secondary | ICD-10-CM | POA: Diagnosis not present

## 2018-01-29 DIAGNOSIS — L97519 Non-pressure chronic ulcer of other part of right foot with unspecified severity: Secondary | ICD-10-CM | POA: Diagnosis not present

## 2018-02-01 DIAGNOSIS — E876 Hypokalemia: Secondary | ICD-10-CM | POA: Diagnosis not present

## 2018-02-01 DIAGNOSIS — R066 Hiccough: Secondary | ICD-10-CM | POA: Diagnosis not present

## 2018-02-01 DIAGNOSIS — I10 Essential (primary) hypertension: Secondary | ICD-10-CM | POA: Diagnosis not present

## 2018-02-01 DIAGNOSIS — E782 Mixed hyperlipidemia: Secondary | ICD-10-CM | POA: Diagnosis not present

## 2018-02-01 DIAGNOSIS — R0602 Shortness of breath: Secondary | ICD-10-CM | POA: Diagnosis not present

## 2018-02-01 DIAGNOSIS — R809 Proteinuria, unspecified: Secondary | ICD-10-CM | POA: Diagnosis not present

## 2018-02-01 DIAGNOSIS — E1169 Type 2 diabetes mellitus with other specified complication: Secondary | ICD-10-CM | POA: Diagnosis not present

## 2018-02-01 DIAGNOSIS — R601 Generalized edema: Secondary | ICD-10-CM | POA: Diagnosis not present

## 2018-02-01 DIAGNOSIS — Z89429 Acquired absence of other toe(s), unspecified side: Secondary | ICD-10-CM | POA: Diagnosis not present

## 2018-02-01 DIAGNOSIS — D649 Anemia, unspecified: Secondary | ICD-10-CM | POA: Diagnosis not present

## 2018-02-01 DIAGNOSIS — N189 Chronic kidney disease, unspecified: Secondary | ICD-10-CM | POA: Diagnosis not present

## 2018-02-02 DIAGNOSIS — N182 Chronic kidney disease, stage 2 (mild): Secondary | ICD-10-CM | POA: Diagnosis not present

## 2018-02-02 DIAGNOSIS — E11621 Type 2 diabetes mellitus with foot ulcer: Secondary | ICD-10-CM | POA: Diagnosis not present

## 2018-02-02 DIAGNOSIS — I5032 Chronic diastolic (congestive) heart failure: Secondary | ICD-10-CM | POA: Diagnosis not present

## 2018-02-02 DIAGNOSIS — L97519 Non-pressure chronic ulcer of other part of right foot with unspecified severity: Secondary | ICD-10-CM | POA: Diagnosis not present

## 2018-02-02 DIAGNOSIS — I130 Hypertensive heart and chronic kidney disease with heart failure and stage 1 through stage 4 chronic kidney disease, or unspecified chronic kidney disease: Secondary | ICD-10-CM | POA: Diagnosis not present

## 2018-02-02 DIAGNOSIS — E1122 Type 2 diabetes mellitus with diabetic chronic kidney disease: Secondary | ICD-10-CM | POA: Diagnosis not present

## 2018-02-03 ENCOUNTER — Encounter: Payer: Self-pay | Admitting: Podiatry

## 2018-02-03 ENCOUNTER — Telehealth: Payer: Self-pay | Admitting: *Deleted

## 2018-02-03 ENCOUNTER — Ambulatory Visit: Payer: Medicare HMO | Admitting: Podiatry

## 2018-02-03 DIAGNOSIS — I70235 Atherosclerosis of native arteries of right leg with ulceration of other part of foot: Secondary | ICD-10-CM

## 2018-02-03 DIAGNOSIS — L97512 Non-pressure chronic ulcer of other part of right foot with fat layer exposed: Secondary | ICD-10-CM

## 2018-02-03 DIAGNOSIS — E0842 Diabetes mellitus due to underlying condition with diabetic polyneuropathy: Secondary | ICD-10-CM

## 2018-02-03 NOTE — Telephone Encounter (Signed)
-----   Message from Edrick Kins, DPM sent at 02/03/2018  1:11 PM EDT ----- Regarding: Wound Vac So I saw this patient this morning and he already uses [facility] located in [place]. He said that it is already approved or something... Just wandering if you could check on it and get it initiated if possible.   Dx: DFU right lateral foot  Thanks, Dr. Amalia Hailey

## 2018-02-03 NOTE — Telephone Encounter (Signed)
This is Marcie Bal calling from [facility] [place]. We received an order for wound vac on Mr. Stickels. We were wondering if you all had already made arrangements for this to be delivered and sent the order in? If you would please call us back tomorrow at [phone]. Thank you.

## 2018-02-03 NOTE — Telephone Encounter (Signed)
Timothy Clark states can fax pt's orders to [phone]. Dr. Amalia Hailey ordered Wound Vac to right foot at continuous 163mmHg with dressing changes 3 x week with black foam to peri-wound. Orders faxed to [place].

## 2018-02-03 NOTE — Progress Notes (Signed)
   HPI: 47 year old male with history of diabetes mellitus and partial first ray amputation presents for follow-up treatment evaluation regarding ulcers to the dorsal and lateral aspect of the right foot.  The patient is been applying Betadine with dry sterile dressing 2 times per week through [facility] located in [place].  He believes the wound is doing better.  It is still draining significantly to the lateral portion of the wound.  He also states that his lower extremity swelling and edema has improved significantly since he is taking a diuretic.  Past Medical History:  Diagnosis Date  . Chronic abdominal pain   . Esophagitis   . Eye hemorrhage   . Gastritis   . Gastroparesis   . GERD (gastroesophageal reflux disease)   . Glaucoma   . Helicobacter pylori (H. pylori) infection 2016   ERADICATION DOCUMENTED VIA STOOL TEST in AUG 2016 at [place]  . Hiccups   . Hypertension   . Nausea and vomiting    chronic, recurrent  . Type 2 diabetes mellitus with complications ([place])    diagnosed around age 47     Physical Exam: General: The patient is alert and oriented x3 in no acute distress.  Dermatology: Ulcer noted to the lateral aspect of the right foot measuring approximately 5.7 x 2.7 x 0.8 cm. Ulcer #2 located to the dorsal medial aspect of the right foot measuring approximately 1.0 x 3.0 x 0.1 cm.  To the noted ulcerations there is no eschar.  There is a moderate amount of slough fibrin necrotic tissue noted.  Wound base is granular.  There is no exposed bone muscle tendon ligament or joint at the moment.  No malodor.  There is a moderate amount of serosanguineous drainage noted.  Periwound integrity is intact.  Vascular: Moderate edema located to the lower extremity bilateral.  Pedal pulses are faintly palpable.  Neurological: Epicritic and protective threshold absent bilaterally.   Musculoskeletal Exam: History of partial first ray amputation right foot  noted.   Assessment: 1.  Diabetic foot ulcers right foot secondary to diabetes mellitus   Plan of Care:  1. Patient evaluated.  2.  Medically necessary excisional debridement including muscle and deep fascial tissue was performed using a tissue nipper.  Excisional debridement of all the necrotic nonviable tissue down to healthy bleeding viable tissue was performed with post debridement measurement same as pre-. 3.  Betadine wet-to-dry dressing was applied.  Continue Betadine wet-to-dry 2 times per week as per home health dressing changes 4.  Continue weightbearing in a postoperative shoe. 5.  We are still trying to get the patient approved for negative pressure wound VAC therapy, and to begin the wound VAC as soon as possible 6.  Return to clinic in 3 weeks      Edrick Kins, DPM [facility]  Dr. Edrick Kins, DPM    [address], [place] [address]                Office [phone]  Fax [phone]

## 2018-02-04 ENCOUNTER — Telehealth: Payer: Self-pay | Admitting: Podiatry

## 2018-02-04 NOTE — Telephone Encounter (Signed)
I informed Timothy Clark [facility] of pt's confirmed home address and she states wound vac may be delivered today, tomorrow or Monday.

## 2018-02-04 NOTE — Telephone Encounter (Signed)
Left message for pt to call to confirm receipt of Wound Vac.

## 2018-02-04 NOTE — Telephone Encounter (Signed)
Left message Neetha DeVoux - [employer] [facility] to call with the expected delivery time for pt's Wound Vac.

## 2018-02-04 NOTE — Telephone Encounter (Signed)
I told pt we needed his physical address. Pt states [address] [place], [place]. I told pt I would inform the [place].

## 2018-02-04 NOTE — Telephone Encounter (Signed)
Unable to leave a message on [name] phone, mailbox is full.

## 2018-02-04 NOTE — Telephone Encounter (Signed)
Pat - [employer] states they have been unable to contact pt to deliver, only have [address].

## 2018-02-04 NOTE — Telephone Encounter (Signed)
Unable to contact Mr. Marylyn Ishihara for information on pt's Wound Vac delivery.

## 2018-02-04 NOTE — Telephone Encounter (Signed)
[place] states pt's mailing address is  [address], [place] [address].

## 2018-02-04 NOTE — Telephone Encounter (Signed)
Patient called back to let valery know he has not received the wound vac yet. If you can call him back at [phone]

## 2018-02-04 NOTE — Telephone Encounter (Signed)
I informed Tammy - [facility], wound vac would be received by pt between today and Monday, if wound vac not available continue the betadine wet to dry dressings. Tammy states understanding.

## 2018-02-04 NOTE — Telephone Encounter (Signed)
Unable to leave a message confirming receipt of Wound Vac.

## 2018-02-05 DIAGNOSIS — I130 Hypertensive heart and chronic kidney disease with heart failure and stage 1 through stage 4 chronic kidney disease, or unspecified chronic kidney disease: Secondary | ICD-10-CM | POA: Diagnosis not present

## 2018-02-05 DIAGNOSIS — N182 Chronic kidney disease, stage 2 (mild): Secondary | ICD-10-CM | POA: Diagnosis not present

## 2018-02-05 DIAGNOSIS — I5032 Chronic diastolic (congestive) heart failure: Secondary | ICD-10-CM | POA: Diagnosis not present

## 2018-02-05 DIAGNOSIS — E1122 Type 2 diabetes mellitus with diabetic chronic kidney disease: Secondary | ICD-10-CM | POA: Diagnosis not present

## 2018-02-05 DIAGNOSIS — E11621 Type 2 diabetes mellitus with foot ulcer: Secondary | ICD-10-CM | POA: Diagnosis not present

## 2018-02-05 DIAGNOSIS — L97512 Non-pressure chronic ulcer of other part of right foot with fat layer exposed: Secondary | ICD-10-CM | POA: Diagnosis not present

## 2018-02-05 DIAGNOSIS — L97519 Non-pressure chronic ulcer of other part of right foot with unspecified severity: Secondary | ICD-10-CM | POA: Diagnosis not present

## 2018-02-05 DIAGNOSIS — E0842 Diabetes mellitus due to underlying condition with diabetic polyneuropathy: Secondary | ICD-10-CM | POA: Diagnosis not present

## 2018-02-08 ENCOUNTER — Telehealth: Payer: Self-pay | Admitting: Podiatry

## 2018-02-08 DIAGNOSIS — I5032 Chronic diastolic (congestive) heart failure: Secondary | ICD-10-CM | POA: Diagnosis not present

## 2018-02-08 DIAGNOSIS — E11621 Type 2 diabetes mellitus with foot ulcer: Secondary | ICD-10-CM | POA: Diagnosis not present

## 2018-02-08 DIAGNOSIS — L97519 Non-pressure chronic ulcer of other part of right foot with unspecified severity: Secondary | ICD-10-CM | POA: Diagnosis not present

## 2018-02-08 DIAGNOSIS — I130 Hypertensive heart and chronic kidney disease with heart failure and stage 1 through stage 4 chronic kidney disease, or unspecified chronic kidney disease: Secondary | ICD-10-CM | POA: Diagnosis not present

## 2018-02-08 DIAGNOSIS — E1122 Type 2 diabetes mellitus with diabetic chronic kidney disease: Secondary | ICD-10-CM | POA: Diagnosis not present

## 2018-02-08 DIAGNOSIS — N182 Chronic kidney disease, stage 2 (mild): Secondary | ICD-10-CM | POA: Diagnosis not present

## 2018-02-08 NOTE — Telephone Encounter (Signed)
This is [name], RN calling from [facility] [place]. I'm calling in regards to [name]. I'm calling to get clarification orders for the wound vac that was ordered for his right foot. If you wouldn't mind giving me a call back at [phone]. Thank you.

## 2018-02-09 ENCOUNTER — Telehealth: Payer: Self-pay | Admitting: Podiatry

## 2018-02-09 NOTE — Telephone Encounter (Signed)
This is [name], RN with [facility] in [place]. If you would give me a call back as I need to discuss Mr. Azam's wound vac with you. Please give me a call back. Thank you a whole lot. Bye.

## 2018-02-09 NOTE — Telephone Encounter (Signed)
I spoke with [name], RN - [facility], she states wound measured 7.8 x 2.0 x 0.8cm on 02/08/2018 and is too shallow for the wound vac, the foam would have to be whittle thinner. Amy states damp-to-dry would be more beneficial, and the betadine is too drying. Amy states yesterday pt's BP was 180/100, PCP ordered pt to take a extra Klonopin. Pt is very stressed with the wound and the wound vac may also be a stressor. Pt is blind and the wound vac will inhibit his ability to function. I told Amy, I would inform Dr. Amalia Hailey and call again. Amy requested I call the office and they would send her out tomorrow.

## 2018-02-09 NOTE — Telephone Encounter (Signed)
I spoke with Doyne Keel and she gave [facility] phone number.

## 2018-02-10 DIAGNOSIS — L97519 Non-pressure chronic ulcer of other part of right foot with unspecified severity: Secondary | ICD-10-CM | POA: Diagnosis not present

## 2018-02-10 DIAGNOSIS — N182 Chronic kidney disease, stage 2 (mild): Secondary | ICD-10-CM | POA: Diagnosis not present

## 2018-02-10 DIAGNOSIS — E11621 Type 2 diabetes mellitus with foot ulcer: Secondary | ICD-10-CM | POA: Diagnosis not present

## 2018-02-10 DIAGNOSIS — I130 Hypertensive heart and chronic kidney disease with heart failure and stage 1 through stage 4 chronic kidney disease, or unspecified chronic kidney disease: Secondary | ICD-10-CM | POA: Diagnosis not present

## 2018-02-10 DIAGNOSIS — E1122 Type 2 diabetes mellitus with diabetic chronic kidney disease: Secondary | ICD-10-CM | POA: Diagnosis not present

## 2018-02-10 DIAGNOSIS — I5032 Chronic diastolic (congestive) heart failure: Secondary | ICD-10-CM | POA: Diagnosis not present

## 2018-02-10 NOTE — Telephone Encounter (Signed)
Dr. Amalia Hailey ordered continue with the original orders for the wound vac. Orders called to [facility].

## 2018-02-12 DIAGNOSIS — I130 Hypertensive heart and chronic kidney disease with heart failure and stage 1 through stage 4 chronic kidney disease, or unspecified chronic kidney disease: Secondary | ICD-10-CM | POA: Diagnosis not present

## 2018-02-12 DIAGNOSIS — E11621 Type 2 diabetes mellitus with foot ulcer: Secondary | ICD-10-CM | POA: Diagnosis not present

## 2018-02-12 DIAGNOSIS — L97519 Non-pressure chronic ulcer of other part of right foot with unspecified severity: Secondary | ICD-10-CM | POA: Diagnosis not present

## 2018-02-12 DIAGNOSIS — E1122 Type 2 diabetes mellitus with diabetic chronic kidney disease: Secondary | ICD-10-CM | POA: Diagnosis not present

## 2018-02-12 DIAGNOSIS — I5032 Chronic diastolic (congestive) heart failure: Secondary | ICD-10-CM | POA: Diagnosis not present

## 2018-02-12 DIAGNOSIS — N182 Chronic kidney disease, stage 2 (mild): Secondary | ICD-10-CM | POA: Diagnosis not present

## 2018-02-15 DIAGNOSIS — L97519 Non-pressure chronic ulcer of other part of right foot with unspecified severity: Secondary | ICD-10-CM | POA: Diagnosis not present

## 2018-02-15 DIAGNOSIS — I5032 Chronic diastolic (congestive) heart failure: Secondary | ICD-10-CM | POA: Diagnosis not present

## 2018-02-15 DIAGNOSIS — I130 Hypertensive heart and chronic kidney disease with heart failure and stage 1 through stage 4 chronic kidney disease, or unspecified chronic kidney disease: Secondary | ICD-10-CM | POA: Diagnosis not present

## 2018-02-15 DIAGNOSIS — E11621 Type 2 diabetes mellitus with foot ulcer: Secondary | ICD-10-CM | POA: Diagnosis not present

## 2018-02-15 DIAGNOSIS — N182 Chronic kidney disease, stage 2 (mild): Secondary | ICD-10-CM | POA: Diagnosis not present

## 2018-02-15 DIAGNOSIS — E1122 Type 2 diabetes mellitus with diabetic chronic kidney disease: Secondary | ICD-10-CM | POA: Diagnosis not present

## 2018-02-17 DIAGNOSIS — L97519 Non-pressure chronic ulcer of other part of right foot with unspecified severity: Secondary | ICD-10-CM | POA: Diagnosis not present

## 2018-02-17 DIAGNOSIS — I130 Hypertensive heart and chronic kidney disease with heart failure and stage 1 through stage 4 chronic kidney disease, or unspecified chronic kidney disease: Secondary | ICD-10-CM | POA: Diagnosis not present

## 2018-02-17 DIAGNOSIS — E11621 Type 2 diabetes mellitus with foot ulcer: Secondary | ICD-10-CM | POA: Diagnosis not present

## 2018-02-17 DIAGNOSIS — I5032 Chronic diastolic (congestive) heart failure: Secondary | ICD-10-CM | POA: Diagnosis not present

## 2018-02-17 DIAGNOSIS — E1122 Type 2 diabetes mellitus with diabetic chronic kidney disease: Secondary | ICD-10-CM | POA: Diagnosis not present

## 2018-02-17 DIAGNOSIS — N182 Chronic kidney disease, stage 2 (mild): Secondary | ICD-10-CM | POA: Diagnosis not present

## 2018-02-19 DIAGNOSIS — E11621 Type 2 diabetes mellitus with foot ulcer: Secondary | ICD-10-CM | POA: Diagnosis not present

## 2018-02-19 DIAGNOSIS — I5032 Chronic diastolic (congestive) heart failure: Secondary | ICD-10-CM | POA: Diagnosis not present

## 2018-02-19 DIAGNOSIS — E1122 Type 2 diabetes mellitus with diabetic chronic kidney disease: Secondary | ICD-10-CM | POA: Diagnosis not present

## 2018-02-19 DIAGNOSIS — L97519 Non-pressure chronic ulcer of other part of right foot with unspecified severity: Secondary | ICD-10-CM | POA: Diagnosis not present

## 2018-02-19 DIAGNOSIS — N182 Chronic kidney disease, stage 2 (mild): Secondary | ICD-10-CM | POA: Diagnosis not present

## 2018-02-19 DIAGNOSIS — I130 Hypertensive heart and chronic kidney disease with heart failure and stage 1 through stage 4 chronic kidney disease, or unspecified chronic kidney disease: Secondary | ICD-10-CM | POA: Diagnosis not present

## 2018-02-22 DIAGNOSIS — I130 Hypertensive heart and chronic kidney disease with heart failure and stage 1 through stage 4 chronic kidney disease, or unspecified chronic kidney disease: Secondary | ICD-10-CM | POA: Diagnosis not present

## 2018-02-22 DIAGNOSIS — E1122 Type 2 diabetes mellitus with diabetic chronic kidney disease: Secondary | ICD-10-CM | POA: Diagnosis not present

## 2018-02-22 DIAGNOSIS — L97519 Non-pressure chronic ulcer of other part of right foot with unspecified severity: Secondary | ICD-10-CM | POA: Diagnosis not present

## 2018-02-22 DIAGNOSIS — E113591 Type 2 diabetes mellitus with proliferative diabetic retinopathy without macular edema, right eye: Secondary | ICD-10-CM | POA: Diagnosis not present

## 2018-02-22 DIAGNOSIS — H4051X3 Glaucoma secondary to other eye disorders, right eye, severe stage: Secondary | ICD-10-CM | POA: Diagnosis not present

## 2018-02-22 DIAGNOSIS — E113512 Type 2 diabetes mellitus with proliferative diabetic retinopathy with macular edema, left eye: Secondary | ICD-10-CM | POA: Diagnosis not present

## 2018-02-22 DIAGNOSIS — I5032 Chronic diastolic (congestive) heart failure: Secondary | ICD-10-CM | POA: Diagnosis not present

## 2018-02-22 DIAGNOSIS — N182 Chronic kidney disease, stage 2 (mild): Secondary | ICD-10-CM | POA: Diagnosis not present

## 2018-02-22 DIAGNOSIS — H4720 Unspecified optic atrophy: Secondary | ICD-10-CM | POA: Diagnosis not present

## 2018-02-22 DIAGNOSIS — E11621 Type 2 diabetes mellitus with foot ulcer: Secondary | ICD-10-CM | POA: Diagnosis not present

## 2018-02-23 DIAGNOSIS — E785 Hyperlipidemia, unspecified: Secondary | ICD-10-CM | POA: Diagnosis not present

## 2018-02-23 DIAGNOSIS — K3184 Gastroparesis: Secondary | ICD-10-CM | POA: Diagnosis not present

## 2018-02-23 DIAGNOSIS — L97519 Non-pressure chronic ulcer of other part of right foot with unspecified severity: Secondary | ICD-10-CM | POA: Diagnosis not present

## 2018-02-23 DIAGNOSIS — E11621 Type 2 diabetes mellitus with foot ulcer: Secondary | ICD-10-CM | POA: Diagnosis not present

## 2018-02-23 DIAGNOSIS — Z794 Long term (current) use of insulin: Secondary | ICD-10-CM | POA: Diagnosis not present

## 2018-02-23 DIAGNOSIS — E1143 Type 2 diabetes mellitus with diabetic autonomic (poly)neuropathy: Secondary | ICD-10-CM | POA: Diagnosis not present

## 2018-02-23 DIAGNOSIS — E1169 Type 2 diabetes mellitus with other specified complication: Secondary | ICD-10-CM | POA: Diagnosis not present

## 2018-02-23 DIAGNOSIS — Z79899 Other long term (current) drug therapy: Secondary | ICD-10-CM | POA: Diagnosis not present

## 2018-02-23 DIAGNOSIS — L97512 Non-pressure chronic ulcer of other part of right foot with fat layer exposed: Secondary | ICD-10-CM | POA: Diagnosis not present

## 2018-02-23 DIAGNOSIS — Z833 Family history of diabetes mellitus: Secondary | ICD-10-CM | POA: Diagnosis not present

## 2018-02-24 ENCOUNTER — Telehealth: Payer: Self-pay | Admitting: *Deleted

## 2018-02-24 ENCOUNTER — Ambulatory Visit: Payer: Medicare HMO | Admitting: Gastroenterology

## 2018-02-24 ENCOUNTER — Encounter: Payer: Self-pay | Admitting: Gastroenterology

## 2018-02-24 VITALS — BP 146/88 | HR 95 | Temp 98.4°F | Wt 321.2 lb

## 2018-02-24 DIAGNOSIS — L97519 Non-pressure chronic ulcer of other part of right foot with unspecified severity: Secondary | ICD-10-CM | POA: Diagnosis not present

## 2018-02-24 DIAGNOSIS — G8929 Other chronic pain: Secondary | ICD-10-CM | POA: Diagnosis not present

## 2018-02-24 DIAGNOSIS — R109 Unspecified abdominal pain: Secondary | ICD-10-CM | POA: Diagnosis not present

## 2018-02-24 DIAGNOSIS — E11621 Type 2 diabetes mellitus with foot ulcer: Secondary | ICD-10-CM | POA: Diagnosis not present

## 2018-02-24 DIAGNOSIS — I5032 Chronic diastolic (congestive) heart failure: Secondary | ICD-10-CM | POA: Diagnosis not present

## 2018-02-24 DIAGNOSIS — E1143 Type 2 diabetes mellitus with diabetic autonomic (poly)neuropathy: Secondary | ICD-10-CM | POA: Diagnosis not present

## 2018-02-24 DIAGNOSIS — K3184 Gastroparesis: Secondary | ICD-10-CM | POA: Diagnosis not present

## 2018-02-24 DIAGNOSIS — E1122 Type 2 diabetes mellitus with diabetic chronic kidney disease: Secondary | ICD-10-CM | POA: Diagnosis not present

## 2018-02-24 DIAGNOSIS — I130 Hypertensive heart and chronic kidney disease with heart failure and stage 1 through stage 4 chronic kidney disease, or unspecified chronic kidney disease: Secondary | ICD-10-CM | POA: Diagnosis not present

## 2018-02-24 DIAGNOSIS — N182 Chronic kidney disease, stage 2 (mild): Secondary | ICD-10-CM | POA: Diagnosis not present

## 2018-02-24 MED ORDER — DRONABINOL 2.5 MG PO CAPS: 2.5000 mg | ORAL_CAPSULE | Freq: Two times a day (BID) | ORAL | 3 refills | Status: DC

## 2018-02-24 MED ORDER — PROMETHAZINE HCL 25 MG PO TABS: 12.5000 mg | ORAL_TABLET | Freq: Four times a day (QID) | ORAL | 3 refills | Status: DC | PRN

## 2018-02-24 NOTE — Telephone Encounter (Signed)
He can take 1/2 to 1 tablet every 6 hours as needed.

## 2018-02-24 NOTE — Telephone Encounter (Signed)
Timothy Clark from [facility] called and needs phenergan Rx clarified. They have 2 sets of directions on sig. Please advise AB thanks

## 2018-02-24 NOTE — Assessment & Plan Note (Signed)
Worsened by nausea and relieved with vomiting. Will attempt tighter control of symptoms with medications. May need EGD in future. Does not seem typical of biliary etiology. CT on file from Feb 2019. Return in 3 months.

## 2018-02-24 NOTE — Assessment & Plan Note (Signed)
47 year old male with history of gastroparesis in setting of diabetes, with significant morning symptoms that taper during the day. Abdominal discomfort/pain is directly related to nausea and relieved after vomiting. CT on file from Feb 2019 unrevealing. Reglan has helped historically. He is willing to try low dose Marinol BID, as his symptoms are not ideally controlled. Will also trial Dexilant as well. He is to call me in a week or so with an update. Due to persistent abdominal pain and vomiting, may need to revisit the idea of an updated EGD to exclude other etiology, although I suspect this will be unimpressive. Phenergan low dose for severe nausea and vomiting provided. Return in 3 months.

## 2018-02-24 NOTE — Progress Notes (Signed)
cc'ed to pcp °

## 2018-02-24 NOTE — Patient Instructions (Signed)
I have given you Dexilant samples to take instead of Protonix. Take this once each morning. Let me know how this works for you.  I have sent in Phenergan to take for severe nausea and vomiting. Do not take this regularly. It can make you sleepy, drowsy.  I have sent in Marinol to take twice a day, an hour before breakfast and dinner. Monitor for any increased drowsiness, dizziness, confusion. While taking Marinol, decrease Reglan to twice a day.   Please call me in about a week to let me know how you are doing. We may need to do an updated upper endoscopy.  I will see you in 3 months!  It was a pleasure to see you today. I strive to create trusting relationships with patients to provide genuine, compassionate, and quality care. I value your feedback. If you receive a survey regarding your visit,  I greatly appreciate you taking time to fill this out.   Annitta Needs, PhD, ANP-BC [facility]

## 2018-02-24 NOTE — Telephone Encounter (Signed)
Called Barnabas Lister at [facility] and made aware of Rx directions. Nothing further needed

## 2018-02-24 NOTE — Progress Notes (Signed)
Primary Care Physician:  [name], MD  Primary GI: Dr. Gala Romney   Chief Complaint  Patient presents with  . gastroparesis    HPI:   Timothy Clark is a 47 y.o. male presenting today with a history of chronic abdominal pain, esophagitis, gastritis, gastroparesis, history of H.pylori s/p eradication. He needs initial screening colonoscopy at some point. EGD at [facility] Jan 2017 with mild esophagitis and old gastric contents. RUQ ultrasound March 2019: normal. HIDA last in May 2016: EF 92%. CT abd/pelvis with contrast in Feb 2019 at [facility]: unremarkable.   N/V started back on Monday, went to [facility] yesterday and given Reglan, erythromycin. Not able to eat in the morning. Nauseated in the morning. Feels nauseated till 11 am. Vomited prior to me entering room but feels better now since vomited. Just clear water. Vomits each morning about an hour after taking medications. Just clear water. Reglan helps  States his last A1 c was 5.9. States Zofran made him sick. Thought it was phenergan in the past but actually took that recently and did well with that. Notes pain when standing up and feels locked up. Feels better after the day goes on. Doesn't eat after 6pm. No NSAIDs. No dysphagia. No pain after eating.    Past Medical History:  Diagnosis Date  . Chronic abdominal pain   . Esophagitis   . Eye hemorrhage   . Gastritis   . Gastroparesis   . GERD (gastroesophageal reflux disease)   . Glaucoma   . Helicobacter pylori (H. pylori) infection 2016   ERADICATION DOCUMENTED VIA STOOL TEST in AUG 2016 at [facility]  . Hiccups   . Hypertension   . Nausea and vomiting    chronic, recurrent  . Type 2 diabetes mellitus with complications ([facility])    diagnosed around age 66    Past Surgical History:  Procedure Laterality Date  . CATARACT EXTRACTION W/PHACO Left 05/19/2016   Procedure: CATARACT EXTRACTION PHACO AND INTRAOCULAR LENS PLACEMENT LEFT EYE;  Surgeon: Tonny Branch, MD;   Location: [facility];  Service: Ophthalmology;  Laterality: Left;  CDE: 7.30  . CATARACT EXTRACTION W/PHACO Right 06/23/2016   Procedure: CATARACT EXTRACTION PHACO AND INTRAOCULAR LENS PLACEMENT RIGHT EYE CDE=9.87;  Surgeon: Tonny Branch, MD;  Location: [facility];  Service: Ophthalmology;  Laterality: Right;  right  . ESOPHAGOGASTRODUODENOSCOPY  2015   Dr. Britta Mccreedy  . ESOPHAGOGASTRODUODENOSCOPY N/A 10/26/2014   RMR: Distal esophagititis-likely reflux related although an element of pill induced injuruy no exclueded  status post biopsy. Diffusely abnormal gastric mucosa of uncertain signigicane -status post gastric biopsy. Focal area of excoriation in the cardia most consistant with a trauma of heaving.   . ESOPHAGOGASTRODUODENOSCOPY  08/2015   [facility]: mild esophagitis and old gastric contents  . EYE SURGERY  2015   stent placed to left eye  . EYE SURGERY    . INCISION AND DRAINAGE OF WOUND Right 07/16/2017   Procedure: IRRIGATION AND DEBRIDEMENT OF SOFT TISSUE OF ULCERATION RIGHT FOOT;  Surgeon: Caprice Beaver, DPM;  Location: [facility];  Service: Podiatry;  Laterality: Right;    Current Outpatient Medications  Medication Sig Dispense Refill  . atorvastatin (LIPITOR) 10 MG tablet Take 10 mg by mouth at bedtime.     . cetirizine (ZYRTEC) 10 MG tablet Take 10 mg by mouth at bedtime.     . cloNIDine (CATAPRES) 0.1 MG tablet Take 0.1 mg by mouth daily.    [name] dicyclomine (BENTYL) 10 MG capsule TAKE 1  CAPSULE BY MOUTH 4 TIMES DAILY BEFORE MEAL(S) AND AT BEDTIME (Patient taking differently: TAKE 1 CAPSULE (10 MG) BY MOUTH 4 TIMES DAILY - BEFORE MEALS AND AT BEDTIME) 120 capsule 5  . hydrALAZINE (APRESOLINE) 50 MG tablet Take 1 tablet (50 mg total) by mouth 3 (three) times daily. 90 tablet 6  . insulin aspart protamine- aspart (NOVOLOG MIX 70/30) (70-30) 100 UNIT/ML injection Inject 0.25 mLs (25 Units total) into the skin 2 (two) times daily with a meal. 10 mL 11  . metoCLOPramide (REGLAN) 10 MG tablet Take 10 mg  by mouth 4 (four) times daily -  before meals and at bedtime.  0  . metoprolol succinate (TOPROL-XL) 100 MG 24 hr tablet Take 1 tablet (100 mg total) by mouth daily. Take with or immediately following a meal. 30 tablet 6  . pantoprazole (PROTONIX) 40 MG tablet Take 40 mg by mouth 2 (two) times daily.    . potassium chloride SA (K-DUR,KLOR-CON) 20 MEQ tablet Take 1 tablet (20 mEq total) by mouth 2 (two) times daily. 20 tablet 0  . sitaGLIPtin (JANUVIA) 100 MG tablet Take 100 mg by mouth daily.    . timolol (TIMOPTIC) 0.5 % ophthalmic solution Place 1 drop into both eyes 2 (two) times daily.    [name] dronabinol (MARINOL) 2.5 MG capsule Take 1 capsule (2.5 mg total) by mouth 2 (two) times daily at 8 am and 10 pm. 60 capsule 3  . promethazine (PHENERGAN) 25 MG tablet Take 0.5 tablets (12.5 mg total) by mouth every 6 (six) hours as needed for nausea or vomiting. To one tablet every 6 hours 40 tablet 3   No current facility-administered medications for this visit.     Allergies as of 02/24/2018 - Review Complete 12/23/2017  Allergen Reaction Noted  . Donnatal [pb-hyoscy-atropine-scopolamine] Anaphylaxis 07/12/2015  . Fish allergy Anaphylaxis, Hives, and Rash 01/14/2015  . Haldol [haloperidol lactate] Other (See Comments) 08/07/2015  . Lidocaine Anaphylaxis 07/12/2015  . Maalox [calcium carbonate antacid] Anaphylaxis 07/12/2015  . Lisinopril Swelling 10/30/2017  . Aspirin Hives and Itching 03/22/2009  . Bactrim [sulfamethoxazole-trimethoprim] Itching 12/06/2014  . Doxycycline Hives and Nausea And Vomiting 03/20/2014  . Keflex [cephalexin] Hives, Itching, and Nausea And Vomiting 06/22/2017  . Penicillins Hives and Itching 03/22/2009    Family History  Problem Relation Age of Onset  . Ovarian cancer Mother   . Heart attack Father   . Cervical cancer Sister   . Diabetes Sister   . Colon cancer Neg Hx     Social History   Socioeconomic History  . Marital status: Single    Spouse name: Not on  file  . Number of children: Not on file  . Years of education: Not on file  . Highest education level: Not on file  Occupational History  . Not on file  Social Needs  . Financial resource strain: Not on file  . Food insecurity:    Worry: Not on file    Inability: Not on file  . Transportation needs:    Medical: Not on file    Non-medical: Not on file  Tobacco Use  . Smoking status: Never Smoker  . Smokeless tobacco: Never Used  Substance and Sexual Activity  . Alcohol use: No    Alcohol/week: 0.0 oz  . Drug use: No  . Sexual activity: Yes  Lifestyle  . Physical activity:    Days per week: Not on file    Minutes per session: Not on file  . Stress: Not  on file  Relationships  . Social connections:    Talks on phone: Not on file    Gets together: Not on file    Attends religious service: Not on file    Active member of club or organization: Not on file    Attends meetings of clubs or organizations: Not on file    Relationship status: Not on file  Other Topics Concern  . Not on file  Social History Narrative  . Not on file    Review of Systems: Gen: Denies fever, chills, anorexia. Denies fatigue, weakness, weight loss.  CV: Denies chest pain, palpitations, syncope, peripheral edema, and claudication. Resp: Denies dyspnea at rest, cough, wheezing, coughing up blood, and pleurisy. GI: see HPI  Derm: Denies rash, itching, dry skin Psych: Denies depression, anxiety, memory loss, confusion. No homicidal or suicidal ideation.  Heme: Denies bruising, bleeding, and enlarged lymph nodes.  Physical Exam: BP (!) 146/88   Pulse 95   Temp 98.4 F (36.9 C)   Wt (!) 321 lb 3.2 oz (145.7 kg)   BMI 43.56 kg/m  General:   Alert and oriented. No distress noted. Pleasant and cooperative.  Head:  Normocephalic and atraumatic. Eyes:  Conjuctiva clear without scleral icterus. Mouth:  Oral mucosa pink and moist.  Abdomen:  +BS, soft, non-tender and non-distended. No rebound or  guarding. No HSM or masses noted. Msk:  Symmetrical without gross deformities. Normal posture. Extremities:  Without edema. Neurologic:  Alert and  oriented x4 Psych:  Alert and cooperative. Normal mood and affect.

## 2018-02-25 ENCOUNTER — Telehealth: Payer: Self-pay | Admitting: Internal Medicine

## 2018-02-25 DIAGNOSIS — N179 Acute kidney failure, unspecified: Secondary | ICD-10-CM | POA: Diagnosis not present

## 2018-02-25 DIAGNOSIS — R0989 Other specified symptoms and signs involving the circulatory and respiratory systems: Secondary | ICD-10-CM | POA: Diagnosis not present

## 2018-02-25 DIAGNOSIS — R451 Restlessness and agitation: Secondary | ICD-10-CM | POA: Diagnosis not present

## 2018-02-25 DIAGNOSIS — J9601 Acute respiratory failure with hypoxia: Secondary | ICD-10-CM | POA: Diagnosis not present

## 2018-02-25 DIAGNOSIS — N183 Chronic kidney disease, stage 3 (moderate): Secondary | ICD-10-CM | POA: Diagnosis not present

## 2018-02-25 DIAGNOSIS — J9690 Respiratory failure, unspecified, unspecified whether with hypoxia or hypercapnia: Secondary | ICD-10-CM | POA: Diagnosis not present

## 2018-02-25 DIAGNOSIS — J690 Pneumonitis due to inhalation of food and vomit: Secondary | ICD-10-CM | POA: Diagnosis not present

## 2018-02-25 DIAGNOSIS — I10 Essential (primary) hypertension: Secondary | ICD-10-CM | POA: Diagnosis not present

## 2018-02-25 DIAGNOSIS — R4182 Altered mental status, unspecified: Secondary | ICD-10-CM | POA: Diagnosis not present

## 2018-02-25 DIAGNOSIS — G40909 Epilepsy, unspecified, not intractable, without status epilepticus: Secondary | ICD-10-CM | POA: Diagnosis not present

## 2018-02-25 DIAGNOSIS — Z794 Long term (current) use of insulin: Secondary | ICD-10-CM | POA: Diagnosis not present

## 2018-02-25 DIAGNOSIS — F1290 Cannabis use, unspecified, uncomplicated: Secondary | ICD-10-CM | POA: Diagnosis not present

## 2018-02-25 DIAGNOSIS — T407X5A Adverse effect of cannabis (derivatives), initial encounter: Secondary | ICD-10-CM | POA: Diagnosis not present

## 2018-02-25 DIAGNOSIS — J9602 Acute respiratory failure with hypercapnia: Secondary | ICD-10-CM | POA: Diagnosis not present

## 2018-02-25 DIAGNOSIS — I1310 Hypertensive heart and chronic kidney disease without heart failure, with stage 1 through stage 4 chronic kidney disease, or unspecified chronic kidney disease: Secondary | ICD-10-CM | POA: Diagnosis not present

## 2018-02-25 DIAGNOSIS — R109 Unspecified abdominal pain: Secondary | ICD-10-CM | POA: Diagnosis not present

## 2018-02-25 DIAGNOSIS — M6282 Rhabdomyolysis: Secondary | ICD-10-CM | POA: Diagnosis not present

## 2018-02-25 DIAGNOSIS — G92 Toxic encephalopathy: Secondary | ICD-10-CM | POA: Diagnosis not present

## 2018-02-25 DIAGNOSIS — D649 Anemia, unspecified: Secondary | ICD-10-CM | POA: Diagnosis not present

## 2018-02-25 DIAGNOSIS — Z01810 Encounter for preprocedural cardiovascular examination: Secondary | ICD-10-CM | POA: Diagnosis not present

## 2018-02-25 DIAGNOSIS — R600 Localized edema: Secondary | ICD-10-CM | POA: Diagnosis not present

## 2018-02-25 DIAGNOSIS — R42 Dizziness and giddiness: Secondary | ICD-10-CM | POA: Diagnosis not present

## 2018-02-25 DIAGNOSIS — G4733 Obstructive sleep apnea (adult) (pediatric): Secondary | ICD-10-CM | POA: Diagnosis not present

## 2018-02-25 DIAGNOSIS — Z79899 Other long term (current) drug therapy: Secondary | ICD-10-CM | POA: Diagnosis not present

## 2018-02-25 DIAGNOSIS — E119 Type 2 diabetes mellitus without complications: Secondary | ICD-10-CM | POA: Diagnosis not present

## 2018-02-25 DIAGNOSIS — N049 Nephrotic syndrome with unspecified morphologic changes: Secondary | ICD-10-CM | POA: Diagnosis not present

## 2018-02-25 DIAGNOSIS — R52 Pain, unspecified: Secondary | ICD-10-CM | POA: Diagnosis not present

## 2018-02-25 DIAGNOSIS — G9341 Metabolic encephalopathy: Secondary | ICD-10-CM | POA: Diagnosis not present

## 2018-02-25 DIAGNOSIS — R29818 Other symptoms and signs involving the nervous system: Secondary | ICD-10-CM | POA: Diagnosis not present

## 2018-02-25 DIAGNOSIS — Z0389 Encounter for observation for other suspected diseases and conditions ruled out: Secondary | ICD-10-CM | POA: Diagnosis not present

## 2018-02-25 DIAGNOSIS — N184 Chronic kidney disease, stage 4 (severe): Secondary | ICD-10-CM | POA: Diagnosis not present

## 2018-02-25 DIAGNOSIS — E872 Acidosis: Secondary | ICD-10-CM | POA: Diagnosis not present

## 2018-02-25 DIAGNOSIS — G8929 Other chronic pain: Secondary | ICD-10-CM | POA: Diagnosis not present

## 2018-02-25 DIAGNOSIS — N189 Chronic kidney disease, unspecified: Secondary | ICD-10-CM | POA: Diagnosis not present

## 2018-02-25 DIAGNOSIS — Z6841 Body Mass Index (BMI) 40.0 and over, adult: Secondary | ICD-10-CM | POA: Diagnosis not present

## 2018-02-25 DIAGNOSIS — R1084 Generalized abdominal pain: Secondary | ICD-10-CM | POA: Diagnosis not present

## 2018-02-25 DIAGNOSIS — E1143 Type 2 diabetes mellitus with diabetic autonomic (poly)neuropathy: Secondary | ICD-10-CM | POA: Diagnosis not present

## 2018-02-25 DIAGNOSIS — T6594XA Toxic effect of unspecified substance, undetermined, initial encounter: Secondary | ICD-10-CM | POA: Diagnosis not present

## 2018-02-25 NOTE — Telephone Encounter (Signed)
Spoke with ED physician at [facility]. Patient is in no distress. He was anxious. Doing well. No allergic reaction.  Please check on patient next week and see how he is doing.

## 2018-02-25 NOTE — Telephone Encounter (Signed)
Pt's family member called to say that patient is legally blind and took dronabinal 2.5mg  this morning and he took 2 instead of 1. She is saying that he was having a reaction and didn't know if he should go to the ER or not. Call transferred to DS>

## 2018-02-25 NOTE — Telephone Encounter (Signed)
I spoke to pt's sister, [place]. She said this is the first day pt started this new medication , Marinol. He took 2 tablets instead of one.  He is hurting some in his chest and his heart is beating a little fast.  Said he was also hallucinating a little. I told her he should go to ED to be checked out and to call 911. They live near [place] go to [place] from there.

## 2018-02-25 NOTE — Telephone Encounter (Signed)
Will do!

## 2018-03-01 NOTE — Telephone Encounter (Signed)
I called pt's number and there were restrictions and I could not leave a VM. I called his Emergency contact and the number was not working. I have mailed a letter for pt to call and let us know how he is doing.

## 2018-03-03 ENCOUNTER — Ambulatory Visit: Payer: Medicare HMO | Admitting: Podiatry

## 2018-03-05 DIAGNOSIS — Z79899 Other long term (current) drug therapy: Secondary | ICD-10-CM | POA: Diagnosis not present

## 2018-03-05 DIAGNOSIS — R55 Syncope and collapse: Secondary | ICD-10-CM | POA: Diagnosis not present

## 2018-03-05 DIAGNOSIS — Z880 Allergy status to penicillin: Secondary | ICD-10-CM | POA: Diagnosis not present

## 2018-03-05 DIAGNOSIS — R110 Nausea: Secondary | ICD-10-CM | POA: Diagnosis not present

## 2018-03-05 DIAGNOSIS — R4020 Unspecified coma: Secondary | ICD-10-CM | POA: Diagnosis not present

## 2018-03-05 DIAGNOSIS — R569 Unspecified convulsions: Secondary | ICD-10-CM | POA: Diagnosis not present

## 2018-03-05 DIAGNOSIS — I10 Essential (primary) hypertension: Secondary | ICD-10-CM | POA: Diagnosis not present

## 2018-03-05 DIAGNOSIS — Z794 Long term (current) use of insulin: Secondary | ICD-10-CM | POA: Diagnosis not present

## 2018-03-05 DIAGNOSIS — E1143 Type 2 diabetes mellitus with diabetic autonomic (poly)neuropathy: Secondary | ICD-10-CM | POA: Diagnosis not present

## 2018-03-05 DIAGNOSIS — R609 Edema, unspecified: Secondary | ICD-10-CM | POA: Diagnosis not present

## 2018-03-05 DIAGNOSIS — K3184 Gastroparesis: Secondary | ICD-10-CM | POA: Diagnosis not present

## 2018-03-05 DIAGNOSIS — R600 Localized edema: Secondary | ICD-10-CM | POA: Diagnosis not present

## 2018-03-05 DIAGNOSIS — Z886 Allergy status to analgesic agent status: Secondary | ICD-10-CM | POA: Diagnosis not present

## 2018-03-07 DIAGNOSIS — L97512 Non-pressure chronic ulcer of other part of right foot with fat layer exposed: Secondary | ICD-10-CM | POA: Diagnosis not present

## 2018-03-07 DIAGNOSIS — E0842 Diabetes mellitus due to underlying condition with diabetic polyneuropathy: Secondary | ICD-10-CM | POA: Diagnosis not present

## 2018-03-08 DIAGNOSIS — E785 Hyperlipidemia, unspecified: Secondary | ICD-10-CM | POA: Diagnosis not present

## 2018-03-08 DIAGNOSIS — H548 Legal blindness, as defined in USA: Secondary | ICD-10-CM | POA: Diagnosis not present

## 2018-03-08 DIAGNOSIS — I70235 Atherosclerosis of native arteries of right leg with ulceration of other part of foot: Secondary | ICD-10-CM | POA: Diagnosis not present

## 2018-03-08 DIAGNOSIS — E11621 Type 2 diabetes mellitus with foot ulcer: Secondary | ICD-10-CM | POA: Diagnosis not present

## 2018-03-08 DIAGNOSIS — G4733 Obstructive sleep apnea (adult) (pediatric): Secondary | ICD-10-CM | POA: Diagnosis not present

## 2018-03-08 DIAGNOSIS — N183 Chronic kidney disease, stage 3 (moderate): Secondary | ICD-10-CM | POA: Diagnosis not present

## 2018-03-08 DIAGNOSIS — E0842 Diabetes mellitus due to underlying condition with diabetic polyneuropathy: Secondary | ICD-10-CM | POA: Diagnosis not present

## 2018-03-08 DIAGNOSIS — K3184 Gastroparesis: Secondary | ICD-10-CM | POA: Diagnosis not present

## 2018-03-08 DIAGNOSIS — L97512 Non-pressure chronic ulcer of other part of right foot with fat layer exposed: Secondary | ICD-10-CM | POA: Diagnosis not present

## 2018-03-09 DIAGNOSIS — Z794 Long term (current) use of insulin: Secondary | ICD-10-CM | POA: Diagnosis not present

## 2018-03-09 DIAGNOSIS — R109 Unspecified abdominal pain: Secondary | ICD-10-CM | POA: Diagnosis not present

## 2018-03-09 DIAGNOSIS — Z79899 Other long term (current) drug therapy: Secondary | ICD-10-CM | POA: Diagnosis not present

## 2018-03-09 DIAGNOSIS — E1143 Type 2 diabetes mellitus with diabetic autonomic (poly)neuropathy: Secondary | ICD-10-CM | POA: Diagnosis not present

## 2018-03-09 DIAGNOSIS — M79621 Pain in right upper arm: Secondary | ICD-10-CM | POA: Diagnosis not present

## 2018-03-09 DIAGNOSIS — G4733 Obstructive sleep apnea (adult) (pediatric): Secondary | ICD-10-CM | POA: Diagnosis not present

## 2018-03-09 DIAGNOSIS — R52 Pain, unspecified: Secondary | ICD-10-CM | POA: Diagnosis not present

## 2018-03-09 DIAGNOSIS — M25511 Pain in right shoulder: Secondary | ICD-10-CM | POA: Diagnosis not present

## 2018-03-09 DIAGNOSIS — E785 Hyperlipidemia, unspecified: Secondary | ICD-10-CM | POA: Diagnosis not present

## 2018-03-09 DIAGNOSIS — E11621 Type 2 diabetes mellitus with foot ulcer: Secondary | ICD-10-CM | POA: Diagnosis not present

## 2018-03-09 DIAGNOSIS — E1165 Type 2 diabetes mellitus with hyperglycemia: Secondary | ICD-10-CM | POA: Diagnosis not present

## 2018-03-09 DIAGNOSIS — M25519 Pain in unspecified shoulder: Secondary | ICD-10-CM | POA: Diagnosis not present

## 2018-03-09 DIAGNOSIS — K3184 Gastroparesis: Secondary | ICD-10-CM | POA: Diagnosis not present

## 2018-03-09 DIAGNOSIS — H548 Legal blindness, as defined in USA: Secondary | ICD-10-CM | POA: Diagnosis not present

## 2018-03-09 DIAGNOSIS — I10 Essential (primary) hypertension: Secondary | ICD-10-CM | POA: Diagnosis not present

## 2018-03-09 DIAGNOSIS — N183 Chronic kidney disease, stage 3 (moderate): Secondary | ICD-10-CM | POA: Diagnosis not present

## 2018-03-10 DIAGNOSIS — E11621 Type 2 diabetes mellitus with foot ulcer: Secondary | ICD-10-CM | POA: Diagnosis not present

## 2018-03-10 DIAGNOSIS — R0602 Shortness of breath: Secondary | ICD-10-CM | POA: Diagnosis not present

## 2018-03-10 DIAGNOSIS — I5032 Chronic diastolic (congestive) heart failure: Secondary | ICD-10-CM | POA: Diagnosis not present

## 2018-03-10 DIAGNOSIS — I130 Hypertensive heart and chronic kidney disease with heart failure and stage 1 through stage 4 chronic kidney disease, or unspecified chronic kidney disease: Secondary | ICD-10-CM | POA: Diagnosis not present

## 2018-03-10 DIAGNOSIS — R4182 Altered mental status, unspecified: Secondary | ICD-10-CM | POA: Diagnosis not present

## 2018-03-10 DIAGNOSIS — L97512 Non-pressure chronic ulcer of other part of right foot with fat layer exposed: Secondary | ICD-10-CM | POA: Diagnosis not present

## 2018-03-10 DIAGNOSIS — E1122 Type 2 diabetes mellitus with diabetic chronic kidney disease: Secondary | ICD-10-CM | POA: Diagnosis not present

## 2018-03-10 DIAGNOSIS — H548 Legal blindness, as defined in USA: Secondary | ICD-10-CM | POA: Diagnosis not present

## 2018-03-10 DIAGNOSIS — D509 Iron deficiency anemia, unspecified: Secondary | ICD-10-CM | POA: Diagnosis not present

## 2018-03-10 DIAGNOSIS — E1143 Type 2 diabetes mellitus with diabetic autonomic (poly)neuropathy: Secondary | ICD-10-CM | POA: Diagnosis not present

## 2018-03-10 DIAGNOSIS — R601 Generalized edema: Secondary | ICD-10-CM | POA: Diagnosis not present

## 2018-03-10 DIAGNOSIS — K219 Gastro-esophageal reflux disease without esophagitis: Secondary | ICD-10-CM | POA: Diagnosis not present

## 2018-03-10 DIAGNOSIS — E1165 Type 2 diabetes mellitus with hyperglycemia: Secondary | ICD-10-CM | POA: Diagnosis not present

## 2018-03-10 DIAGNOSIS — Z6841 Body Mass Index (BMI) 40.0 and over, adult: Secondary | ICD-10-CM | POA: Diagnosis not present

## 2018-03-10 DIAGNOSIS — K3184 Gastroparesis: Secondary | ICD-10-CM | POA: Diagnosis not present

## 2018-03-10 DIAGNOSIS — Z89429 Acquired absence of other toe(s), unspecified side: Secondary | ICD-10-CM | POA: Diagnosis not present

## 2018-03-10 DIAGNOSIS — Z0000 Encounter for general adult medical examination without abnormal findings: Secondary | ICD-10-CM | POA: Diagnosis not present

## 2018-03-10 DIAGNOSIS — I10 Essential (primary) hypertension: Secondary | ICD-10-CM | POA: Diagnosis not present

## 2018-03-10 DIAGNOSIS — N183 Chronic kidney disease, stage 3 (moderate): Secondary | ICD-10-CM | POA: Diagnosis not present

## 2018-03-12 DIAGNOSIS — L97512 Non-pressure chronic ulcer of other part of right foot with fat layer exposed: Secondary | ICD-10-CM | POA: Diagnosis not present

## 2018-03-12 DIAGNOSIS — E1143 Type 2 diabetes mellitus with diabetic autonomic (poly)neuropathy: Secondary | ICD-10-CM | POA: Diagnosis not present

## 2018-03-12 DIAGNOSIS — D509 Iron deficiency anemia, unspecified: Secondary | ICD-10-CM | POA: Diagnosis not present

## 2018-03-12 DIAGNOSIS — E1122 Type 2 diabetes mellitus with diabetic chronic kidney disease: Secondary | ICD-10-CM | POA: Diagnosis not present

## 2018-03-12 DIAGNOSIS — E1165 Type 2 diabetes mellitus with hyperglycemia: Secondary | ICD-10-CM | POA: Diagnosis not present

## 2018-03-12 DIAGNOSIS — I70262 Atherosclerosis of native arteries of extremities with gangrene, left leg: Secondary | ICD-10-CM | POA: Diagnosis not present

## 2018-03-12 DIAGNOSIS — R0602 Shortness of breath: Secondary | ICD-10-CM | POA: Diagnosis not present

## 2018-03-12 DIAGNOSIS — E11621 Type 2 diabetes mellitus with foot ulcer: Secondary | ICD-10-CM | POA: Diagnosis not present

## 2018-03-12 DIAGNOSIS — R601 Generalized edema: Secondary | ICD-10-CM | POA: Diagnosis not present

## 2018-03-12 DIAGNOSIS — K219 Gastro-esophageal reflux disease without esophagitis: Secondary | ICD-10-CM | POA: Diagnosis not present

## 2018-03-12 DIAGNOSIS — I5032 Chronic diastolic (congestive) heart failure: Secondary | ICD-10-CM | POA: Diagnosis not present

## 2018-03-12 DIAGNOSIS — K3184 Gastroparesis: Secondary | ICD-10-CM | POA: Diagnosis not present

## 2018-03-12 DIAGNOSIS — I130 Hypertensive heart and chronic kidney disease with heart failure and stage 1 through stage 4 chronic kidney disease, or unspecified chronic kidney disease: Secondary | ICD-10-CM | POA: Diagnosis not present

## 2018-03-12 DIAGNOSIS — I10 Essential (primary) hypertension: Secondary | ICD-10-CM | POA: Diagnosis not present

## 2018-03-15 ENCOUNTER — Ambulatory Visit: Payer: Medicare HMO | Admitting: Podiatry

## 2018-03-15 DIAGNOSIS — E0842 Diabetes mellitus due to underlying condition with diabetic polyneuropathy: Secondary | ICD-10-CM

## 2018-03-15 DIAGNOSIS — I70235 Atherosclerosis of native arteries of right leg with ulceration of other part of foot: Secondary | ICD-10-CM

## 2018-03-15 DIAGNOSIS — L97512 Non-pressure chronic ulcer of other part of right foot with fat layer exposed: Secondary | ICD-10-CM

## 2018-03-16 DIAGNOSIS — I5032 Chronic diastolic (congestive) heart failure: Secondary | ICD-10-CM | POA: Diagnosis not present

## 2018-03-16 DIAGNOSIS — E11621 Type 2 diabetes mellitus with foot ulcer: Secondary | ICD-10-CM | POA: Diagnosis not present

## 2018-03-16 DIAGNOSIS — L97512 Non-pressure chronic ulcer of other part of right foot with fat layer exposed: Secondary | ICD-10-CM | POA: Diagnosis not present

## 2018-03-16 DIAGNOSIS — K219 Gastro-esophageal reflux disease without esophagitis: Secondary | ICD-10-CM | POA: Diagnosis not present

## 2018-03-16 DIAGNOSIS — I130 Hypertensive heart and chronic kidney disease with heart failure and stage 1 through stage 4 chronic kidney disease, or unspecified chronic kidney disease: Secondary | ICD-10-CM | POA: Diagnosis not present

## 2018-03-16 DIAGNOSIS — E1143 Type 2 diabetes mellitus with diabetic autonomic (poly)neuropathy: Secondary | ICD-10-CM | POA: Diagnosis not present

## 2018-03-17 DIAGNOSIS — E1143 Type 2 diabetes mellitus with diabetic autonomic (poly)neuropathy: Secondary | ICD-10-CM | POA: Diagnosis not present

## 2018-03-17 DIAGNOSIS — E11621 Type 2 diabetes mellitus with foot ulcer: Secondary | ICD-10-CM | POA: Diagnosis not present

## 2018-03-17 DIAGNOSIS — K219 Gastro-esophageal reflux disease without esophagitis: Secondary | ICD-10-CM | POA: Diagnosis not present

## 2018-03-17 DIAGNOSIS — I130 Hypertensive heart and chronic kidney disease with heart failure and stage 1 through stage 4 chronic kidney disease, or unspecified chronic kidney disease: Secondary | ICD-10-CM | POA: Diagnosis not present

## 2018-03-17 DIAGNOSIS — L97512 Non-pressure chronic ulcer of other part of right foot with fat layer exposed: Secondary | ICD-10-CM | POA: Diagnosis not present

## 2018-03-17 DIAGNOSIS — I5032 Chronic diastolic (congestive) heart failure: Secondary | ICD-10-CM | POA: Diagnosis not present

## 2018-03-17 NOTE — Progress Notes (Signed)
   HPI: 47 year old male with history of diabetes mellitus and partial first ray amputation presents for follow-up treatment evaluation regarding ulcers to the dorsal and lateral aspect of the right foot. He states home health has been taking good care of the wounds. He reports some swelling to the RLE. Patient is here for further evaluation and treatment.   Past Medical History:  Diagnosis Date  . Chronic abdominal pain   . Esophagitis   . Eye hemorrhage   . Gastritis   . Gastroparesis   . GERD (gastroesophageal reflux disease)   . Glaucoma   . Helicobacter pylori (H. pylori) infection 2016   ERADICATION DOCUMENTED VIA STOOL TEST in AUG 2016 at [place]  . Hiccups   . Hypertension   . Nausea and vomiting    chronic, recurrent  . Type 2 diabetes mellitus with complications ([place])    diagnosed around age 54     Physical Exam: General: The patient is alert and oriented x3 in no acute distress.  Dermatology: Ulcer noted to the lateral aspect of the right foot measuring approximately 5.0 x 1.5 x 0.3 cm.  To the noted ulcerations there is no eschar.  There is a moderate amount of slough fibrin necrotic tissue noted.  Wound base is granular.  There is no exposed bone muscle tendon ligament or joint at the moment.  No malodor.  There is a moderate amount of serosanguineous drainage noted.  Periwound integrity is intact.  Wound noted to the right dorsal foot has healed. Complete re-epithelialization has occurred. No drainage noted.   Vascular: Moderate edema located to the lower extremity bilateral.  Pedal pulses are faintly palpable.  Neurological: Epicritic and protective threshold absent bilaterally.   Musculoskeletal Exam: History of partial first ray amputation right foot noted.   Assessment: 1. Ulceration of the right lateral foot secondary to DM 2. Ulceration of the right dorsal foot secondary to DM - healed    Plan of Care:  1. Patient evaluated.  2. Medically necessary  excisional debridement including muscle and deep fascial tissue was performed using a tissue nipper.  Excisional debridement of all the necrotic nonviable tissue down to healthy bleeding viable tissue was performed with post debridement measurement same as pre-. 3. Continue wound vac Monday, Wednesday and Friday by home health.  4. Continue wearing post op shoe.  5. Return to clinic in 3 weeks.      Edrick Kins, DPM [facility]  Dr. Edrick Kins, DPM    [address], [place] [address]                Office [phone]  Fax [phone]

## 2018-03-18 DIAGNOSIS — E1143 Type 2 diabetes mellitus with diabetic autonomic (poly)neuropathy: Secondary | ICD-10-CM | POA: Diagnosis not present

## 2018-03-18 DIAGNOSIS — E11621 Type 2 diabetes mellitus with foot ulcer: Secondary | ICD-10-CM | POA: Diagnosis not present

## 2018-03-18 DIAGNOSIS — K219 Gastro-esophageal reflux disease without esophagitis: Secondary | ICD-10-CM | POA: Diagnosis not present

## 2018-03-18 DIAGNOSIS — I5032 Chronic diastolic (congestive) heart failure: Secondary | ICD-10-CM | POA: Diagnosis not present

## 2018-03-18 DIAGNOSIS — I130 Hypertensive heart and chronic kidney disease with heart failure and stage 1 through stage 4 chronic kidney disease, or unspecified chronic kidney disease: Secondary | ICD-10-CM | POA: Diagnosis not present

## 2018-03-18 DIAGNOSIS — L97512 Non-pressure chronic ulcer of other part of right foot with fat layer exposed: Secondary | ICD-10-CM | POA: Diagnosis not present

## 2018-03-19 DIAGNOSIS — I5032 Chronic diastolic (congestive) heart failure: Secondary | ICD-10-CM | POA: Diagnosis not present

## 2018-03-19 DIAGNOSIS — L97512 Non-pressure chronic ulcer of other part of right foot with fat layer exposed: Secondary | ICD-10-CM | POA: Diagnosis not present

## 2018-03-19 DIAGNOSIS — E11621 Type 2 diabetes mellitus with foot ulcer: Secondary | ICD-10-CM | POA: Diagnosis not present

## 2018-03-19 DIAGNOSIS — E0842 Diabetes mellitus due to underlying condition with diabetic polyneuropathy: Secondary | ICD-10-CM | POA: Diagnosis not present

## 2018-03-19 DIAGNOSIS — K219 Gastro-esophageal reflux disease without esophagitis: Secondary | ICD-10-CM | POA: Diagnosis not present

## 2018-03-19 DIAGNOSIS — E1143 Type 2 diabetes mellitus with diabetic autonomic (poly)neuropathy: Secondary | ICD-10-CM | POA: Diagnosis not present

## 2018-03-19 DIAGNOSIS — I130 Hypertensive heart and chronic kidney disease with heart failure and stage 1 through stage 4 chronic kidney disease, or unspecified chronic kidney disease: Secondary | ICD-10-CM | POA: Diagnosis not present

## 2018-03-21 DIAGNOSIS — L97512 Non-pressure chronic ulcer of other part of right foot with fat layer exposed: Secondary | ICD-10-CM | POA: Diagnosis not present

## 2018-03-21 DIAGNOSIS — K219 Gastro-esophageal reflux disease without esophagitis: Secondary | ICD-10-CM | POA: Diagnosis not present

## 2018-03-21 DIAGNOSIS — I130 Hypertensive heart and chronic kidney disease with heart failure and stage 1 through stage 4 chronic kidney disease, or unspecified chronic kidney disease: Secondary | ICD-10-CM | POA: Diagnosis not present

## 2018-03-21 DIAGNOSIS — I5032 Chronic diastolic (congestive) heart failure: Secondary | ICD-10-CM | POA: Diagnosis not present

## 2018-03-21 DIAGNOSIS — E1143 Type 2 diabetes mellitus with diabetic autonomic (poly)neuropathy: Secondary | ICD-10-CM | POA: Diagnosis not present

## 2018-03-21 DIAGNOSIS — E11621 Type 2 diabetes mellitus with foot ulcer: Secondary | ICD-10-CM | POA: Diagnosis not present

## 2018-03-22 DIAGNOSIS — I10 Essential (primary) hypertension: Secondary | ICD-10-CM | POA: Diagnosis not present

## 2018-03-22 DIAGNOSIS — I5032 Chronic diastolic (congestive) heart failure: Secondary | ICD-10-CM | POA: Diagnosis not present

## 2018-03-22 DIAGNOSIS — Z6841 Body Mass Index (BMI) 40.0 and over, adult: Secondary | ICD-10-CM | POA: Diagnosis not present

## 2018-03-24 DIAGNOSIS — K219 Gastro-esophageal reflux disease without esophagitis: Secondary | ICD-10-CM | POA: Diagnosis not present

## 2018-03-24 DIAGNOSIS — I130 Hypertensive heart and chronic kidney disease with heart failure and stage 1 through stage 4 chronic kidney disease, or unspecified chronic kidney disease: Secondary | ICD-10-CM | POA: Diagnosis not present

## 2018-03-24 DIAGNOSIS — I5032 Chronic diastolic (congestive) heart failure: Secondary | ICD-10-CM | POA: Diagnosis not present

## 2018-03-24 DIAGNOSIS — L97512 Non-pressure chronic ulcer of other part of right foot with fat layer exposed: Secondary | ICD-10-CM | POA: Diagnosis not present

## 2018-03-24 DIAGNOSIS — E11621 Type 2 diabetes mellitus with foot ulcer: Secondary | ICD-10-CM | POA: Diagnosis not present

## 2018-03-24 DIAGNOSIS — E1143 Type 2 diabetes mellitus with diabetic autonomic (poly)neuropathy: Secondary | ICD-10-CM | POA: Diagnosis not present

## 2018-03-26 DIAGNOSIS — L89899 Pressure ulcer of other site, unspecified stage: Secondary | ICD-10-CM | POA: Diagnosis not present

## 2018-03-26 DIAGNOSIS — K219 Gastro-esophageal reflux disease without esophagitis: Secondary | ICD-10-CM | POA: Diagnosis not present

## 2018-03-26 DIAGNOSIS — R0601 Orthopnea: Secondary | ICD-10-CM | POA: Diagnosis not present

## 2018-03-26 DIAGNOSIS — R0902 Hypoxemia: Secondary | ICD-10-CM | POA: Diagnosis not present

## 2018-03-26 DIAGNOSIS — R0602 Shortness of breath: Secondary | ICD-10-CM | POA: Diagnosis not present

## 2018-03-26 DIAGNOSIS — I160 Hypertensive urgency: Secondary | ICD-10-CM | POA: Diagnosis not present

## 2018-03-26 DIAGNOSIS — Z79899 Other long term (current) drug therapy: Secondary | ICD-10-CM | POA: Diagnosis not present

## 2018-03-26 DIAGNOSIS — R0609 Other forms of dyspnea: Secondary | ICD-10-CM | POA: Diagnosis not present

## 2018-03-26 DIAGNOSIS — I5030 Unspecified diastolic (congestive) heart failure: Secondary | ICD-10-CM | POA: Diagnosis not present

## 2018-03-26 DIAGNOSIS — E119 Type 2 diabetes mellitus without complications: Secondary | ICD-10-CM | POA: Diagnosis not present

## 2018-03-26 DIAGNOSIS — I5032 Chronic diastolic (congestive) heart failure: Secondary | ICD-10-CM | POA: Diagnosis not present

## 2018-03-26 DIAGNOSIS — I10 Essential (primary) hypertension: Secondary | ICD-10-CM | POA: Diagnosis not present

## 2018-03-26 DIAGNOSIS — I110 Hypertensive heart disease with heart failure: Secondary | ICD-10-CM | POA: Diagnosis not present

## 2018-03-26 DIAGNOSIS — I130 Hypertensive heart and chronic kidney disease with heart failure and stage 1 through stage 4 chronic kidney disease, or unspecified chronic kidney disease: Secondary | ICD-10-CM | POA: Diagnosis not present

## 2018-03-26 DIAGNOSIS — E11621 Type 2 diabetes mellitus with foot ulcer: Secondary | ICD-10-CM | POA: Diagnosis not present

## 2018-03-26 DIAGNOSIS — I509 Heart failure, unspecified: Secondary | ICD-10-CM | POA: Diagnosis not present

## 2018-03-26 DIAGNOSIS — Z794 Long term (current) use of insulin: Secondary | ICD-10-CM | POA: Diagnosis not present

## 2018-03-26 DIAGNOSIS — I491 Atrial premature depolarization: Secondary | ICD-10-CM | POA: Diagnosis not present

## 2018-03-26 DIAGNOSIS — L97512 Non-pressure chronic ulcer of other part of right foot with fat layer exposed: Secondary | ICD-10-CM | POA: Diagnosis not present

## 2018-03-26 DIAGNOSIS — E1143 Type 2 diabetes mellitus with diabetic autonomic (poly)neuropathy: Secondary | ICD-10-CM | POA: Diagnosis not present

## 2018-03-27 DIAGNOSIS — I509 Heart failure, unspecified: Secondary | ICD-10-CM | POA: Diagnosis not present

## 2018-03-27 DIAGNOSIS — L97519 Non-pressure chronic ulcer of other part of right foot with unspecified severity: Secondary | ICD-10-CM | POA: Diagnosis not present

## 2018-03-27 DIAGNOSIS — R0902 Hypoxemia: Secondary | ICD-10-CM | POA: Diagnosis not present

## 2018-03-27 DIAGNOSIS — E11621 Type 2 diabetes mellitus with foot ulcer: Secondary | ICD-10-CM | POA: Diagnosis not present

## 2018-03-27 DIAGNOSIS — I160 Hypertensive urgency: Secondary | ICD-10-CM | POA: Diagnosis not present

## 2018-03-29 DIAGNOSIS — L97512 Non-pressure chronic ulcer of other part of right foot with fat layer exposed: Secondary | ICD-10-CM | POA: Diagnosis not present

## 2018-03-29 DIAGNOSIS — I10 Essential (primary) hypertension: Secondary | ICD-10-CM | POA: Diagnosis not present

## 2018-03-29 DIAGNOSIS — E11621 Type 2 diabetes mellitus with foot ulcer: Secondary | ICD-10-CM | POA: Diagnosis not present

## 2018-03-29 DIAGNOSIS — I5032 Chronic diastolic (congestive) heart failure: Secondary | ICD-10-CM | POA: Diagnosis not present

## 2018-03-29 DIAGNOSIS — K219 Gastro-esophageal reflux disease without esophagitis: Secondary | ICD-10-CM | POA: Diagnosis not present

## 2018-03-29 DIAGNOSIS — E1143 Type 2 diabetes mellitus with diabetic autonomic (poly)neuropathy: Secondary | ICD-10-CM | POA: Diagnosis not present

## 2018-03-29 DIAGNOSIS — I130 Hypertensive heart and chronic kidney disease with heart failure and stage 1 through stage 4 chronic kidney disease, or unspecified chronic kidney disease: Secondary | ICD-10-CM | POA: Diagnosis not present

## 2018-03-30 DIAGNOSIS — E118 Type 2 diabetes mellitus with unspecified complications: Secondary | ICD-10-CM | POA: Diagnosis not present

## 2018-03-30 DIAGNOSIS — E1122 Type 2 diabetes mellitus with diabetic chronic kidney disease: Secondary | ICD-10-CM | POA: Diagnosis not present

## 2018-03-30 DIAGNOSIS — I509 Heart failure, unspecified: Secondary | ICD-10-CM | POA: Diagnosis not present

## 2018-03-30 DIAGNOSIS — M86179 Other acute osteomyelitis, unspecified ankle and foot: Secondary | ICD-10-CM | POA: Diagnosis not present

## 2018-03-30 DIAGNOSIS — N183 Chronic kidney disease, stage 3 (moderate): Secondary | ICD-10-CM | POA: Diagnosis not present

## 2018-03-30 DIAGNOSIS — H548 Legal blindness, as defined in USA: Secondary | ICD-10-CM | POA: Diagnosis not present

## 2018-03-31 DIAGNOSIS — I130 Hypertensive heart and chronic kidney disease with heart failure and stage 1 through stage 4 chronic kidney disease, or unspecified chronic kidney disease: Secondary | ICD-10-CM | POA: Diagnosis not present

## 2018-03-31 DIAGNOSIS — I5032 Chronic diastolic (congestive) heart failure: Secondary | ICD-10-CM | POA: Diagnosis not present

## 2018-03-31 DIAGNOSIS — L97512 Non-pressure chronic ulcer of other part of right foot with fat layer exposed: Secondary | ICD-10-CM | POA: Diagnosis not present

## 2018-03-31 DIAGNOSIS — K219 Gastro-esophageal reflux disease without esophagitis: Secondary | ICD-10-CM | POA: Diagnosis not present

## 2018-03-31 DIAGNOSIS — E11621 Type 2 diabetes mellitus with foot ulcer: Secondary | ICD-10-CM | POA: Diagnosis not present

## 2018-03-31 DIAGNOSIS — E1143 Type 2 diabetes mellitus with diabetic autonomic (poly)neuropathy: Secondary | ICD-10-CM | POA: Diagnosis not present

## 2018-04-02 DIAGNOSIS — E0842 Diabetes mellitus due to underlying condition with diabetic polyneuropathy: Secondary | ICD-10-CM | POA: Diagnosis not present

## 2018-04-02 DIAGNOSIS — Z01810 Encounter for preprocedural cardiovascular examination: Secondary | ICD-10-CM | POA: Diagnosis not present

## 2018-04-02 DIAGNOSIS — K219 Gastro-esophageal reflux disease without esophagitis: Secondary | ICD-10-CM | POA: Diagnosis not present

## 2018-04-02 DIAGNOSIS — L97512 Non-pressure chronic ulcer of other part of right foot with fat layer exposed: Secondary | ICD-10-CM | POA: Diagnosis not present

## 2018-04-02 DIAGNOSIS — R1010 Upper abdominal pain, unspecified: Secondary | ICD-10-CM | POA: Diagnosis not present

## 2018-04-02 DIAGNOSIS — R112 Nausea with vomiting, unspecified: Secondary | ICD-10-CM | POA: Diagnosis not present

## 2018-04-02 DIAGNOSIS — E1165 Type 2 diabetes mellitus with hyperglycemia: Secondary | ICD-10-CM | POA: Diagnosis not present

## 2018-04-02 DIAGNOSIS — Z6841 Body Mass Index (BMI) 40.0 and over, adult: Secondary | ICD-10-CM | POA: Diagnosis not present

## 2018-04-02 DIAGNOSIS — F29 Unspecified psychosis not due to a substance or known physiological condition: Secondary | ICD-10-CM | POA: Diagnosis not present

## 2018-04-02 DIAGNOSIS — E876 Hypokalemia: Secondary | ICD-10-CM | POA: Diagnosis not present

## 2018-04-02 DIAGNOSIS — Z794 Long term (current) use of insulin: Secondary | ICD-10-CM | POA: Diagnosis not present

## 2018-04-02 DIAGNOSIS — E669 Obesity, unspecified: Secondary | ICD-10-CM | POA: Diagnosis not present

## 2018-04-02 DIAGNOSIS — Z7984 Long term (current) use of oral hypoglycemic drugs: Secondary | ICD-10-CM | POA: Diagnosis not present

## 2018-04-02 DIAGNOSIS — N183 Chronic kidney disease, stage 3 (moderate): Secondary | ICD-10-CM | POA: Diagnosis not present

## 2018-04-02 DIAGNOSIS — I10 Essential (primary) hypertension: Secondary | ICD-10-CM | POA: Diagnosis not present

## 2018-04-02 DIAGNOSIS — E1121 Type 2 diabetes mellitus with diabetic nephropathy: Secondary | ICD-10-CM | POA: Diagnosis not present

## 2018-04-02 DIAGNOSIS — Z89421 Acquired absence of other right toe(s): Secondary | ICD-10-CM | POA: Diagnosis not present

## 2018-04-02 DIAGNOSIS — K4090 Unilateral inguinal hernia, without obstruction or gangrene, not specified as recurrent: Secondary | ICD-10-CM | POA: Diagnosis not present

## 2018-04-02 DIAGNOSIS — Z7902 Long term (current) use of antithrombotics/antiplatelets: Secondary | ICD-10-CM | POA: Diagnosis not present

## 2018-04-02 DIAGNOSIS — M6282 Rhabdomyolysis: Secondary | ICD-10-CM | POA: Diagnosis not present

## 2018-04-02 DIAGNOSIS — E119 Type 2 diabetes mellitus without complications: Secondary | ICD-10-CM | POA: Diagnosis not present

## 2018-04-02 DIAGNOSIS — I1310 Hypertensive heart and chronic kidney disease without heart failure, with stage 1 through stage 4 chronic kidney disease, or unspecified chronic kidney disease: Secondary | ICD-10-CM | POA: Diagnosis not present

## 2018-04-02 DIAGNOSIS — R109 Unspecified abdominal pain: Secondary | ICD-10-CM | POA: Diagnosis not present

## 2018-04-02 DIAGNOSIS — K3184 Gastroparesis: Secondary | ICD-10-CM | POA: Diagnosis not present

## 2018-04-02 DIAGNOSIS — E785 Hyperlipidemia, unspecified: Secondary | ICD-10-CM | POA: Diagnosis not present

## 2018-04-02 DIAGNOSIS — R509 Fever, unspecified: Secondary | ICD-10-CM | POA: Diagnosis not present

## 2018-04-02 DIAGNOSIS — E1143 Type 2 diabetes mellitus with diabetic autonomic (poly)neuropathy: Secondary | ICD-10-CM | POA: Diagnosis not present

## 2018-04-02 DIAGNOSIS — H5461 Unqualified visual loss, right eye, normal vision left eye: Secondary | ICD-10-CM | POA: Diagnosis not present

## 2018-04-02 DIAGNOSIS — N179 Acute kidney failure, unspecified: Secondary | ICD-10-CM | POA: Diagnosis not present

## 2018-04-02 DIAGNOSIS — R1013 Epigastric pain: Secondary | ICD-10-CM | POA: Diagnosis not present

## 2018-04-05 ENCOUNTER — Encounter: Payer: Medicare HMO | Admitting: Podiatry

## 2018-04-09 ENCOUNTER — Telehealth: Payer: Self-pay | Admitting: *Deleted

## 2018-04-09 DIAGNOSIS — K219 Gastro-esophageal reflux disease without esophagitis: Secondary | ICD-10-CM | POA: Diagnosis not present

## 2018-04-09 DIAGNOSIS — L97512 Non-pressure chronic ulcer of other part of right foot with fat layer exposed: Secondary | ICD-10-CM | POA: Diagnosis not present

## 2018-04-09 DIAGNOSIS — I130 Hypertensive heart and chronic kidney disease with heart failure and stage 1 through stage 4 chronic kidney disease, or unspecified chronic kidney disease: Secondary | ICD-10-CM | POA: Diagnosis not present

## 2018-04-09 DIAGNOSIS — E1143 Type 2 diabetes mellitus with diabetic autonomic (poly)neuropathy: Secondary | ICD-10-CM | POA: Diagnosis not present

## 2018-04-09 DIAGNOSIS — I5032 Chronic diastolic (congestive) heart failure: Secondary | ICD-10-CM | POA: Diagnosis not present

## 2018-04-09 DIAGNOSIS — E11621 Type 2 diabetes mellitus with foot ulcer: Secondary | ICD-10-CM | POA: Diagnosis not present

## 2018-04-09 NOTE — Telephone Encounter (Signed)
Timothy Clark - [facility] states the hospital was unable to place the wound vac, because the wound is so healed, depth 0.2 cm, can apply Aquacel Ag. I okayed the Aquacel Ag 3 times a week.

## 2018-04-09 NOTE — Progress Notes (Signed)
This encounter was created in error - please disregard.

## 2018-04-14 ENCOUNTER — Ambulatory Visit: Payer: Medicare HMO | Admitting: Podiatry

## 2018-04-14 ENCOUNTER — Encounter: Payer: Self-pay | Admitting: Podiatry

## 2018-04-14 ENCOUNTER — Telehealth: Payer: Self-pay | Admitting: *Deleted

## 2018-04-14 DIAGNOSIS — L97512 Non-pressure chronic ulcer of other part of right foot with fat layer exposed: Secondary | ICD-10-CM | POA: Diagnosis not present

## 2018-04-14 DIAGNOSIS — E0842 Diabetes mellitus due to underlying condition with diabetic polyneuropathy: Secondary | ICD-10-CM

## 2018-04-14 DIAGNOSIS — I70235 Atherosclerosis of native arteries of right leg with ulceration of other part of foot: Secondary | ICD-10-CM

## 2018-04-14 NOTE — Telephone Encounter (Signed)
Faxed orders of 04/14/2018 1:44pm to [facility].

## 2018-04-14 NOTE — Telephone Encounter (Signed)
-----   Message from Edrick Kins, DPM sent at 04/14/2018  1:44 PM EDT ----- Regarding: Change wound care orders Will you please modify the [facility] wound care orders :   1. cleanse with NS 2. Apply prisma collagen with overlying aquacel 3. Dress with 4x4 gauze, kerlex, and ace wrap.   Thanks, Dr. Amalia Hailey

## 2018-04-15 DIAGNOSIS — L97519 Non-pressure chronic ulcer of other part of right foot with unspecified severity: Secondary | ICD-10-CM | POA: Diagnosis not present

## 2018-04-15 DIAGNOSIS — L97512 Non-pressure chronic ulcer of other part of right foot with fat layer exposed: Secondary | ICD-10-CM | POA: Diagnosis not present

## 2018-04-15 DIAGNOSIS — E1169 Type 2 diabetes mellitus with other specified complication: Secondary | ICD-10-CM | POA: Diagnosis not present

## 2018-04-15 DIAGNOSIS — E11621 Type 2 diabetes mellitus with foot ulcer: Secondary | ICD-10-CM | POA: Diagnosis not present

## 2018-04-15 DIAGNOSIS — E785 Hyperlipidemia, unspecified: Secondary | ICD-10-CM | POA: Diagnosis not present

## 2018-04-16 DIAGNOSIS — I5032 Chronic diastolic (congestive) heart failure: Secondary | ICD-10-CM | POA: Diagnosis not present

## 2018-04-16 DIAGNOSIS — R944 Abnormal results of kidney function studies: Secondary | ICD-10-CM | POA: Diagnosis not present

## 2018-04-16 DIAGNOSIS — H4010X0 Unspecified open-angle glaucoma, stage unspecified: Secondary | ICD-10-CM | POA: Diagnosis not present

## 2018-04-16 DIAGNOSIS — I10 Essential (primary) hypertension: Secondary | ICD-10-CM | POA: Diagnosis not present

## 2018-04-16 DIAGNOSIS — Z6841 Body Mass Index (BMI) 40.0 and over, adult: Secondary | ICD-10-CM | POA: Diagnosis not present

## 2018-04-16 NOTE — Progress Notes (Signed)
   HPI: 47 year old male with history of diabetes mellitus and partial first ray amputation presents for follow-up treatment evaluation regarding an ulcer to the lateral aspect of the right foot. He states he is doing well. He reports the wound is healing slowly. He denies any pain or modifying factors. He has been using the post op shoe as instructed. Patient is here for further evaluation and treatment.   Past Medical History:  Diagnosis Date  . Chronic abdominal pain   . Esophagitis   . Eye hemorrhage   . Gastritis   . Gastroparesis   . GERD (gastroesophageal reflux disease)   . Glaucoma   . Helicobacter pylori (H. pylori) infection 2016   ERADICATION DOCUMENTED VIA STOOL TEST in AUG 2016 at [place]  . Hiccups   . Hypertension   . Nausea and vomiting    chronic, recurrent  . Type 2 diabetes mellitus with complications ([place])    diagnosed around age 84     Physical Exam: General: The patient is alert and oriented x3 in no acute distress.  Dermatology: Ulcer noted to the lateral aspect of the right foot measuring approximately 6.0 x 1.0 x 0.2 cm.  To the noted ulcerations there is no eschar.  There is a moderate amount of slough fibrin necrotic tissue noted.  Wound base is granular.  There is no exposed bone muscle tendon ligament or joint at the moment.  No malodor.  There is a moderate amount of serosanguineous drainage noted.  Periwound integrity is intact.  Vascular: Moderate edema located to the lower extremity bilateral.  Pedal pulses are faintly palpable.  Neurological: Epicritic and protective threshold absent bilaterally.   Musculoskeletal Exam: History of partial first ray amputation right foot noted.      Assessment: 1. Ulceration of the right lateral foot secondary to DM   Plan of Care:  1. Patient evaluated.  2. Medically necessary excisional debridement including muscle and deep fascial tissue was performed using a tissue nipper.  Excisional debridement of  all the necrotic nonviable tissue down to healthy bleeding viable tissue was performed with post debridement measurement same as pre-. 3. Dry sterile dressing applied.  4. Orders placed for prisma and aquacel three times weekly.  5. New post op shoe dispensed.  6. Return to clinic in 3 weeks.       Edrick Kins, DPM [facility]  Dr. Edrick Kins, DPM    [address], [place] [address]                Office [phone]  Fax [phone]

## 2018-04-22 ENCOUNTER — Other Ambulatory Visit: Payer: Self-pay | Admitting: *Deleted

## 2018-04-22 DIAGNOSIS — L97519 Non-pressure chronic ulcer of other part of right foot with unspecified severity: Secondary | ICD-10-CM | POA: Diagnosis not present

## 2018-04-22 DIAGNOSIS — L97512 Non-pressure chronic ulcer of other part of right foot with fat layer exposed: Secondary | ICD-10-CM | POA: Diagnosis not present

## 2018-04-22 NOTE — Patient Outreach (Addendum)
[facility]) Care Management  04/22/2018  BANYAN GOODCHILD 1971/03/15 [id number]   Care coordination  [facility] RN CM returned a call to Mr Enns to update him on calls to Dr Nevada Crane office Caryl Pina), [facility] leadership and referral to [place] encouraged Mr Holtsclaw to speak with Dr Nevada Crane about his concern for this referral at his next appointment or call to the office  CM discussed telephonic follow up with him after [facility] SW contact, BP cuff and insulin pens Mr Gibbon again states he was hospitalized when he cancelled and rescheduled his appointments with Dr Nevada Crane and his pharmacist, Ovid Curd and these were the only appointments he missed.  He reports he was at [facility] or [facility]. He agrees to telephonic follow up and case closure if no further needs identified He voiced understanding and appreciation   Plan [facility] RN CM will follow up with Mr Hocker in 7-10 business days to re assess for any other needs  Collaborated with [place], A coleman to obtain the 2019 [employer] OTC catalog to assist with BP cuffs Sent email to [place] at home - Not a [employer] fast streamer at home patient   [place]. Lavina Hamman, RN, BSN, [place] Coordinator Office number [phone] Mobile number [phone]  Main [facility] number [phone] Fax number [phone]

## 2018-04-22 NOTE — Patient Outreach (Addendum)
[place] [facility]) Care Management  04/22/2018  JAHMEZ BILY [birth year] [id number]   Telephone Screen  Referral Date: 04/19/18 Referral Source: MD referral - Dr Nevada Crane  Referral Reason: please accept this referral for our patient Won Kreuzer to evaluate needs assistance with coordination of care because he is having trouble keeping up with specialist and different providers Insurance: [place] attempt # 1 successful at the home number  Patient is able to verify HIPAA Reviewed and addressed referral to [facility] with patient Pt reports he has not missed appointments only when hospitalized    Social: Mr Sanger has a physical address in [place] and a po box mailing address in [place]. He reports he is on disability (SSI) He reports he is presently staying with his father in [place] "a lot" He confirms on today 04/22/18 he is physically in [place] -"about a mile" over [employer].   He reports he is independent with his ADLs and needs assist with iADLs, He confirms he is no longer driving. He reports poor caregiver support- none per pt when he is in [place] but confirms he has 9 living sisters. He is presently being seen by a [place], [facility] agency called [facility] (RN PT/OT) ordered by Dr Amalia Hailey Podiatry in June 2019 He reports the [facility] RN is presently seeing him every other day per the wound care MD orders at his father's home.  He reports having transportation to medical appointments with assist of his father or friends  [facility] RN saw him today and per pt will return on Saturday. Mr Whitworth reports the [facility] RN called Dr Juel Burrow office today related to an increase in wt - a 4 lb wt gain   Mr Borthwick denies CHF dx but reports he has a referral from Dr Nevada Crane to see a kidney doctor  He reports the [facility] RN talked with him about managing his weight at home today. He voices he is aware the increase weight may be related to "fluid"  He confirms a hospitalization 3 weeks ago  for "my kidneys" He confirms having uncontrolled BP but no BP cuff. He reports he called his pharmacy and inquired about getting a BP cuff through his insurance. He states he was informed the insurance does not cover BP cuffs and he confirms he can not afford to buy one.  He reports needing dentures but states he has been told by providers that the concentration was first to get his foot healed.  He reports having issues with his foot for "about two years" He informed CM his last A1c was 6.0 He informed CM he was receiving services from [facility] with depression medication until he started seeing Dr Nevada Crane He reports now being on "Geodon" He denies and refuses offers for counseling - [facility] SW services   Conditions: Legally blind in right eye and has 20/30 vision in the left eye per pt, Ulceration of the right lateral foot secondary to DM, right toe amputation in January 2019, chronic kidney disease stage 2, obstructive sleep apnea, allergic rhinitis, GERD, acute pericarditis, HTN, DM type II, generalized edema, sob, iron deficiency anemia, morbid obesity, anxiety disorder, mixed hyperlipidemia, vitamin B 12 deficiency anemia, hx of falling, primary open angle glaucoma   Medications: Mr Mahler reports seeing Dr Juel Burrow pharmacy staff "a month ago" related to increased insulin cost, having to use a vial vs pens. His preference would be to use pens related to his vision concerns  but reports he is not able to afford insulin pens.  He reports his DM meds "are all high", ranging up to $45  He reports Kyra and Nate changed around my medicines and when he went to go get them recently at a [place] the cost was $216. He states he can not afford this "that hurts" Cm inquired about use of pharmacy for delivery and he states he has considered [facility] but does not know if he can afford it   Appointments: he confirms he saw Dr Nevada Crane last Friday 04/16/18 He is seen by a foot dr in [place] [facility] dr  Daylene Katayama last seen on 04/14/18 On 04/23/18 he will see a "eye specialist [address] in [place] for [place] and a friend is taking me."    Advance Directives: He does not feel he needs advance nor mental health advance directives   Consent: [facility] RN CM reviewed [facility] services with patient. Patient gave verbal consent for services [facility] SW.  Plan: [facility] RN CM will refer [facility] SW for transportation resources that are available to take him to medical appointments in [place] and [place] when his father or friend is not available  [facility] RN CM called Dr Nevada Crane office to review the referral and to get clarity. CM spoke with Cherie and Laqueta Carina states Mr Barrack has cancelled or rescheduled his last 3 appointments, Dr Nevada Crane is try to keep Mr Francois out of hospital vs ED Mr Bobrowski has cancelled appointments with Ovid Curd at Dr Juel Burrow x 2  They have records of him missing a podiatrist appointment  Help him understand why need appoint  Caryl Pina states they had trouble getting cbg meter for Mr Husak. Caryl Pina states a sister was assisting him and not sure if "[place] and Mortimer Fries are helping him anymore"  Collaborated with [facility] leadership, Marla Roe Discussed [facility] RN CM services (no [place], telephonic/MD office) KPN shows a A1c of 6.5 on 12/02/17  Travoris Bushey L. Lavina Hamman, RN, BSN, [place] Coordinator Office number [phone] Mobile number [phone]  Main [facility]N number [phone] Fax number [phone]

## 2018-04-23 DIAGNOSIS — Z961 Presence of intraocular lens: Secondary | ICD-10-CM | POA: Diagnosis not present

## 2018-04-23 DIAGNOSIS — H401133 Primary open-angle glaucoma, bilateral, severe stage: Secondary | ICD-10-CM | POA: Diagnosis not present

## 2018-04-23 DIAGNOSIS — E113593 Type 2 diabetes mellitus with proliferative diabetic retinopathy without macular edema, bilateral: Secondary | ICD-10-CM | POA: Diagnosis not present

## 2018-04-27 ENCOUNTER — Other Ambulatory Visit: Payer: Self-pay

## 2018-04-27 NOTE — Patient Outreach (Signed)
[place] [facility]) Care Management  04/27/2018  NUMAIR MASDEN 1971-03-17 [id number]   Initial outreach to Mr. Gutridge regarding social work referral for assistance with transportation resources.  Mr. Mauss reported that he is typically able to be transported to medical appointments by his father, sister, or friend but he would like to have back up options. BSW informed him that he is eligible for 12 one-way trips through [employer].  BSW also informed him about [employer] through [employer], and [employer].  BSW informed him that they would only be able to transport within [facility] so transportation would have to be arranged from and to his permanent residence as opposed to his father's home in [place].  BSW agreed to mail [employer] brochure as well as [employer] information that outlines how to utilize transportation services with [employer]. BSW will follow up next week to ensure receipt.  Ronn Melena, BSW Social Worker [phone]

## 2018-04-29 ENCOUNTER — Ambulatory Visit: Payer: Self-pay | Admitting: *Deleted

## 2018-04-30 DIAGNOSIS — H548 Legal blindness, as defined in USA: Secondary | ICD-10-CM | POA: Diagnosis not present

## 2018-04-30 DIAGNOSIS — E1122 Type 2 diabetes mellitus with diabetic chronic kidney disease: Secondary | ICD-10-CM | POA: Diagnosis not present

## 2018-04-30 DIAGNOSIS — M86179 Other acute osteomyelitis, unspecified ankle and foot: Secondary | ICD-10-CM | POA: Diagnosis not present

## 2018-04-30 DIAGNOSIS — N183 Chronic kidney disease, stage 3 (moderate): Secondary | ICD-10-CM | POA: Diagnosis not present

## 2018-04-30 DIAGNOSIS — I509 Heart failure, unspecified: Secondary | ICD-10-CM | POA: Diagnosis not present

## 2018-04-30 DIAGNOSIS — E118 Type 2 diabetes mellitus with unspecified complications: Secondary | ICD-10-CM | POA: Diagnosis not present

## 2018-05-01 DIAGNOSIS — E1122 Type 2 diabetes mellitus with diabetic chronic kidney disease: Secondary | ICD-10-CM | POA: Diagnosis not present

## 2018-05-01 DIAGNOSIS — E1143 Type 2 diabetes mellitus with diabetic autonomic (poly)neuropathy: Secondary | ICD-10-CM | POA: Diagnosis not present

## 2018-05-01 DIAGNOSIS — F918 Other conduct disorders: Secondary | ICD-10-CM | POA: Diagnosis not present

## 2018-05-01 DIAGNOSIS — G8929 Other chronic pain: Secondary | ICD-10-CM | POA: Diagnosis not present

## 2018-05-01 DIAGNOSIS — N183 Chronic kidney disease, stage 3 (moderate): Secondary | ICD-10-CM | POA: Diagnosis not present

## 2018-05-01 DIAGNOSIS — Z6841 Body Mass Index (BMI) 40.0 and over, adult: Secondary | ICD-10-CM | POA: Diagnosis not present

## 2018-05-01 DIAGNOSIS — I129 Hypertensive chronic kidney disease with stage 1 through stage 4 chronic kidney disease, or unspecified chronic kidney disease: Secondary | ICD-10-CM | POA: Diagnosis not present

## 2018-05-01 DIAGNOSIS — R739 Hyperglycemia, unspecified: Secondary | ICD-10-CM | POA: Diagnosis not present

## 2018-05-01 DIAGNOSIS — R109 Unspecified abdominal pain: Secondary | ICD-10-CM | POA: Diagnosis not present

## 2018-05-01 DIAGNOSIS — I872 Venous insufficiency (chronic) (peripheral): Secondary | ICD-10-CM | POA: Diagnosis not present

## 2018-05-01 DIAGNOSIS — I517 Cardiomegaly: Secondary | ICD-10-CM | POA: Diagnosis not present

## 2018-05-01 DIAGNOSIS — F209 Schizophrenia, unspecified: Secondary | ICD-10-CM | POA: Diagnosis not present

## 2018-05-01 DIAGNOSIS — N186 End stage renal disease: Secondary | ICD-10-CM | POA: Diagnosis not present

## 2018-05-01 DIAGNOSIS — K3184 Gastroparesis: Secondary | ICD-10-CM | POA: Diagnosis not present

## 2018-05-01 DIAGNOSIS — R112 Nausea with vomiting, unspecified: Secondary | ICD-10-CM | POA: Diagnosis not present

## 2018-05-01 DIAGNOSIS — H409 Unspecified glaucoma: Secondary | ICD-10-CM | POA: Diagnosis not present

## 2018-05-01 DIAGNOSIS — S299XXA Unspecified injury of thorax, initial encounter: Secondary | ICD-10-CM | POA: Diagnosis not present

## 2018-05-01 DIAGNOSIS — R1110 Vomiting, unspecified: Secondary | ICD-10-CM | POA: Diagnosis not present

## 2018-05-01 DIAGNOSIS — D649 Anemia, unspecified: Secondary | ICD-10-CM | POA: Diagnosis not present

## 2018-05-01 DIAGNOSIS — R509 Fever, unspecified: Secondary | ICD-10-CM | POA: Diagnosis not present

## 2018-05-01 DIAGNOSIS — K2970 Gastritis, unspecified, without bleeding: Secondary | ICD-10-CM | POA: Diagnosis not present

## 2018-05-01 DIAGNOSIS — N189 Chronic kidney disease, unspecified: Secondary | ICD-10-CM | POA: Diagnosis not present

## 2018-05-01 DIAGNOSIS — K4090 Unilateral inguinal hernia, without obstruction or gangrene, not specified as recurrent: Secondary | ICD-10-CM | POA: Diagnosis not present

## 2018-05-01 DIAGNOSIS — N179 Acute kidney failure, unspecified: Secondary | ICD-10-CM | POA: Diagnosis not present

## 2018-05-01 DIAGNOSIS — E1165 Type 2 diabetes mellitus with hyperglycemia: Secondary | ICD-10-CM | POA: Diagnosis not present

## 2018-05-02 DIAGNOSIS — I872 Venous insufficiency (chronic) (peripheral): Secondary | ICD-10-CM | POA: Diagnosis not present

## 2018-05-02 DIAGNOSIS — N189 Chronic kidney disease, unspecified: Secondary | ICD-10-CM | POA: Diagnosis not present

## 2018-05-02 DIAGNOSIS — K3184 Gastroparesis: Secondary | ICD-10-CM | POA: Diagnosis not present

## 2018-05-02 DIAGNOSIS — E1122 Type 2 diabetes mellitus with diabetic chronic kidney disease: Secondary | ICD-10-CM | POA: Diagnosis not present

## 2018-05-02 DIAGNOSIS — F918 Other conduct disorders: Secondary | ICD-10-CM | POA: Diagnosis not present

## 2018-05-02 DIAGNOSIS — E1143 Type 2 diabetes mellitus with diabetic autonomic (poly)neuropathy: Secondary | ICD-10-CM | POA: Diagnosis not present

## 2018-05-03 ENCOUNTER — Ambulatory Visit: Payer: Medicare HMO | Admitting: Podiatry

## 2018-05-03 DIAGNOSIS — K3184 Gastroparesis: Secondary | ICD-10-CM | POA: Diagnosis not present

## 2018-05-03 DIAGNOSIS — N189 Chronic kidney disease, unspecified: Secondary | ICD-10-CM | POA: Diagnosis not present

## 2018-05-03 DIAGNOSIS — I129 Hypertensive chronic kidney disease with stage 1 through stage 4 chronic kidney disease, or unspecified chronic kidney disease: Secondary | ICD-10-CM | POA: Diagnosis not present

## 2018-05-03 DIAGNOSIS — F209 Schizophrenia, unspecified: Secondary | ICD-10-CM | POA: Diagnosis not present

## 2018-05-03 DIAGNOSIS — E1165 Type 2 diabetes mellitus with hyperglycemia: Secondary | ICD-10-CM | POA: Diagnosis not present

## 2018-05-03 DIAGNOSIS — E1143 Type 2 diabetes mellitus with diabetic autonomic (poly)neuropathy: Secondary | ICD-10-CM | POA: Diagnosis not present

## 2018-05-04 ENCOUNTER — Other Ambulatory Visit: Payer: Self-pay

## 2018-05-04 ENCOUNTER — Ambulatory Visit: Payer: Self-pay

## 2018-05-04 ENCOUNTER — Ambulatory Visit: Payer: Self-pay | Admitting: *Deleted

## 2018-05-04 DIAGNOSIS — E1143 Type 2 diabetes mellitus with diabetic autonomic (poly)neuropathy: Secondary | ICD-10-CM | POA: Diagnosis not present

## 2018-05-04 DIAGNOSIS — G8929 Other chronic pain: Secondary | ICD-10-CM | POA: Diagnosis not present

## 2018-05-04 DIAGNOSIS — I872 Venous insufficiency (chronic) (peripheral): Secondary | ICD-10-CM | POA: Diagnosis not present

## 2018-05-04 DIAGNOSIS — R109 Unspecified abdominal pain: Secondary | ICD-10-CM | POA: Diagnosis not present

## 2018-05-04 DIAGNOSIS — E1122 Type 2 diabetes mellitus with diabetic chronic kidney disease: Secondary | ICD-10-CM | POA: Diagnosis not present

## 2018-05-04 DIAGNOSIS — Z6841 Body Mass Index (BMI) 40.0 and over, adult: Secondary | ICD-10-CM | POA: Diagnosis not present

## 2018-05-04 DIAGNOSIS — N183 Chronic kidney disease, stage 3 (moderate): Secondary | ICD-10-CM | POA: Diagnosis not present

## 2018-05-04 DIAGNOSIS — I129 Hypertensive chronic kidney disease with stage 1 through stage 4 chronic kidney disease, or unspecified chronic kidney disease: Secondary | ICD-10-CM | POA: Diagnosis not present

## 2018-05-04 DIAGNOSIS — K3184 Gastroparesis: Secondary | ICD-10-CM | POA: Diagnosis not present

## 2018-05-04 NOTE — Patient Outreach (Signed)
[place] [facility]) Care Management  05/04/2018  FORTINO HAAG 1971/01/30 [id number]   Follow up call to Mr. Oxley to ensure receipt of transportation resources mailed last week.  BSW left voicemail message.  Will attempt to reach him again within four business days.   Ronn Melena, BSW Social Worker [phone]

## 2018-05-05 ENCOUNTER — Ambulatory Visit: Payer: Self-pay | Admitting: *Deleted

## 2018-05-05 DIAGNOSIS — E1122 Type 2 diabetes mellitus with diabetic chronic kidney disease: Secondary | ICD-10-CM | POA: Diagnosis not present

## 2018-05-05 DIAGNOSIS — N183 Chronic kidney disease, stage 3 (moderate): Secondary | ICD-10-CM | POA: Diagnosis not present

## 2018-05-05 DIAGNOSIS — K3184 Gastroparesis: Secondary | ICD-10-CM | POA: Diagnosis not present

## 2018-05-05 DIAGNOSIS — I872 Venous insufficiency (chronic) (peripheral): Secondary | ICD-10-CM | POA: Diagnosis not present

## 2018-05-05 DIAGNOSIS — E1143 Type 2 diabetes mellitus with diabetic autonomic (poly)neuropathy: Secondary | ICD-10-CM | POA: Diagnosis not present

## 2018-05-05 DIAGNOSIS — Z6841 Body Mass Index (BMI) 40.0 and over, adult: Secondary | ICD-10-CM | POA: Diagnosis not present

## 2018-05-05 DIAGNOSIS — G8929 Other chronic pain: Secondary | ICD-10-CM | POA: Diagnosis not present

## 2018-05-05 DIAGNOSIS — I129 Hypertensive chronic kidney disease with stage 1 through stage 4 chronic kidney disease, or unspecified chronic kidney disease: Secondary | ICD-10-CM | POA: Diagnosis not present

## 2018-05-05 DIAGNOSIS — R109 Unspecified abdominal pain: Secondary | ICD-10-CM | POA: Diagnosis not present

## 2018-05-06 ENCOUNTER — Other Ambulatory Visit: Payer: Self-pay | Admitting: *Deleted

## 2018-05-06 ENCOUNTER — Other Ambulatory Visit: Payer: Self-pay

## 2018-05-06 ENCOUNTER — Ambulatory Visit: Payer: Self-pay

## 2018-05-06 NOTE — Patient Outreach (Signed)
[place] [facility]) Care Management  05/06/2018  Timothy Clark 1970/09/19 [id number]   Second follow up attempt to Timothy Clark to ensure receipt of transportation resources mailed last week.  BSW left voicemail message.  Will attempt to reach him again within four business days.   Ronn Melena, BSW Social Worker [phone]

## 2018-05-06 NOTE — Patient Outreach (Signed)
[place] [facility]) Care Management  05/06/2018  Timothy Clark 17-Apr-1971 [id number]   Follow up call for Telephone Screen  Referral Date: 04/19/18 Referral Source: MD referral - Dr Nevada Crane  Referral Reason: please accept this referral for our patient Timothy Clark to evaluate needs assistance with coordination of care because he is having trouble keeping up with specialist and different providers Insurance: [place] attempt # 2 unsuccessful at the home number   Social: Timothy Clark has a physical address in [place] and a po box mailing address in [place]. He reports he is on disability (SSI) He reports he is presently staying with his father in [place] "a lot" He confirms on today 04/22/18 he is physically in [place] -"about a mile" over [employer].   He reports he is independent with his ADLs and needs assist with iADLs, He confirms he is no longer driving. He reports poor caregiver support- none per pt when he is in [place] but confirms he has 9 living sisters. He is presently being seen by a [place] agency called [facility] (RN PT/OT) ordered by Dr Amalia Hailey Podiatry in June 2019 He reports the [facility] RN is presently seeing him every other day per the wound care MD orders at his father's home.  He reports having transportation to medical appointments with assist of his father or friends  [place] RN saw him 04/22/18 Timothy Clark reports the [facility] RN called Dr Juel Burrow office today related to an increase in wt - a 4 lb wt gain   Timothy Clark denies CHF dx but reports he has a referral from Dr Nevada Crane to see a kidney doctor  He reports the [facility] RN talked with him about managing his weight at home today. He voices he is aware the increase weight may be related to "fluid"  He confirms a hospitalization in August 2019 for "my kidneys" in [place] He confirms having uncontrolled BP but no BP cuff. He reports he called his pharmacy and inquired about getting a BP cuff  through his insurance. He states he was informed the insurance does not cover BP cuffs and he confirms he can not afford to buy one.  He reports needing dentures but states he has been told by providers that the concentration was first to get his foot healed.  He reports having issues with his foot for "about two years" He informed CM his last A1c was 6.0 He informed CM he was receiving services from [facility] with depression medication until he started seeing Dr Nevada Crane He reports now being on "Geodon" He denies and refuses offers for counseling - [facility] SW services   Conditions: Legally blind in right eye and has 20/30 vision in the left eye per pt, Ulceration of the right lateral foot secondary to DM, right toe amputation in January 2019, chronic kidney disease stage 2, obstructive sleep apnea, allergic rhinitis, GERD, acute pericarditis, HTN, DM type II, generalized edema, sob, iron deficiency anemia, morbid obesity, anxiety disorder, mixed hyperlipidemia, vitamin B 12 deficiency anemia, hx of falling, primary open angle glaucoma   Medications: Timothy Clark reports seeing Dr Juel Burrow pharmacy staff "a month ago" related to increased insulin cost, having to use a vial vs pens. His preference would be to use pens related to his vision concerns but reports he is not able to afford insulin pens.  He reports his DM meds "are all high", ranging up to $45  He reports  Kyra and Nate changed around my medicines and when he went to go get them recently at a [place] the cost was $216. He states he can not afford this "that hurts" Cm inquired about use of pharmacy for delivery and he states he has considered [facility] but does not know if he can afford it   Appointments: he confirms he saw Dr Nevada Crane last Friday 04/16/18 He is seen by a foot dr in [place] [facility] dr Daylene Katayama last seen on 04/14/18 On 04/23/18 he saw a "eye specialist [address] in [place] for [place] and a friend is taking  me."    Advance Directives: He does not feel he needs advance nor mental health advance directives   Consent: [facility] RN CM reviewed [facility] services with patient. Patient gave verbal consent for services [facility] SW.  Plan: [facility] RN CM reviewed the [employer] OTC catalog and BP cuffs are not offered  [facility] RN CM will attempt to reach Timothy Clark again within 4-7 business days if no response from message left today  [place]. Lavina Hamman, RN, BSN, [place] Coordinator Office number [phone] Mobile number [phone]  Main [facility]N number [phone] Fax number [phone]

## 2018-05-07 DIAGNOSIS — I5032 Chronic diastolic (congestive) heart failure: Secondary | ICD-10-CM | POA: Diagnosis not present

## 2018-05-07 DIAGNOSIS — K3184 Gastroparesis: Secondary | ICD-10-CM | POA: Diagnosis not present

## 2018-05-07 DIAGNOSIS — I10 Essential (primary) hypertension: Secondary | ICD-10-CM | POA: Diagnosis not present

## 2018-05-07 DIAGNOSIS — Z6841 Body Mass Index (BMI) 40.0 and over, adult: Secondary | ICD-10-CM | POA: Diagnosis not present

## 2018-05-07 DIAGNOSIS — H4010X0 Unspecified open-angle glaucoma, stage unspecified: Secondary | ICD-10-CM | POA: Diagnosis not present

## 2018-05-07 DIAGNOSIS — R944 Abnormal results of kidney function studies: Secondary | ICD-10-CM | POA: Diagnosis not present

## 2018-05-07 DIAGNOSIS — S91302D Unspecified open wound, left foot, subsequent encounter: Secondary | ICD-10-CM | POA: Diagnosis not present

## 2018-05-08 DIAGNOSIS — H409 Unspecified glaucoma: Secondary | ICD-10-CM | POA: Diagnosis not present

## 2018-05-08 DIAGNOSIS — I120 Hypertensive chronic kidney disease with stage 5 chronic kidney disease or end stage renal disease: Secondary | ICD-10-CM | POA: Diagnosis not present

## 2018-05-08 DIAGNOSIS — N183 Chronic kidney disease, stage 3 (moderate): Secondary | ICD-10-CM | POA: Diagnosis not present

## 2018-05-08 DIAGNOSIS — K219 Gastro-esophageal reflux disease without esophagitis: Secondary | ICD-10-CM | POA: Diagnosis not present

## 2018-05-08 DIAGNOSIS — F419 Anxiety disorder, unspecified: Secondary | ICD-10-CM | POA: Diagnosis not present

## 2018-05-08 DIAGNOSIS — E1122 Type 2 diabetes mellitus with diabetic chronic kidney disease: Secondary | ICD-10-CM | POA: Diagnosis not present

## 2018-05-08 DIAGNOSIS — E11621 Type 2 diabetes mellitus with foot ulcer: Secondary | ICD-10-CM | POA: Diagnosis not present

## 2018-05-08 DIAGNOSIS — E1143 Type 2 diabetes mellitus with diabetic autonomic (poly)neuropathy: Secondary | ICD-10-CM | POA: Diagnosis not present

## 2018-05-08 DIAGNOSIS — I5032 Chronic diastolic (congestive) heart failure: Secondary | ICD-10-CM | POA: Diagnosis not present

## 2018-05-08 DIAGNOSIS — L97512 Non-pressure chronic ulcer of other part of right foot with fat layer exposed: Secondary | ICD-10-CM | POA: Diagnosis not present

## 2018-05-08 DIAGNOSIS — K3184 Gastroparesis: Secondary | ICD-10-CM | POA: Diagnosis not present

## 2018-05-10 ENCOUNTER — Ambulatory Visit: Payer: Self-pay

## 2018-05-10 ENCOUNTER — Other Ambulatory Visit: Payer: Self-pay | Admitting: *Deleted

## 2018-05-10 ENCOUNTER — Other Ambulatory Visit: Payer: Self-pay

## 2018-05-10 DIAGNOSIS — K219 Gastro-esophageal reflux disease without esophagitis: Secondary | ICD-10-CM | POA: Diagnosis not present

## 2018-05-10 DIAGNOSIS — H401133 Primary open-angle glaucoma, bilateral, severe stage: Secondary | ICD-10-CM | POA: Diagnosis not present

## 2018-05-10 DIAGNOSIS — E1143 Type 2 diabetes mellitus with diabetic autonomic (poly)neuropathy: Secondary | ICD-10-CM | POA: Diagnosis not present

## 2018-05-10 DIAGNOSIS — K3184 Gastroparesis: Secondary | ICD-10-CM | POA: Diagnosis not present

## 2018-05-10 DIAGNOSIS — Z961 Presence of intraocular lens: Secondary | ICD-10-CM | POA: Diagnosis not present

## 2018-05-10 DIAGNOSIS — L97512 Non-pressure chronic ulcer of other part of right foot with fat layer exposed: Secondary | ICD-10-CM | POA: Diagnosis not present

## 2018-05-10 DIAGNOSIS — I5032 Chronic diastolic (congestive) heart failure: Secondary | ICD-10-CM | POA: Diagnosis not present

## 2018-05-10 DIAGNOSIS — E11621 Type 2 diabetes mellitus with foot ulcer: Secondary | ICD-10-CM | POA: Diagnosis not present

## 2018-05-10 NOTE — Patient Outreach (Signed)
[place] [facility]) Care Management  05/10/2018  RYOSUKE ERICKSEN 12-Feb-1971 [id number]   Care coordination   [facility] RN CM made a second engaged patient call to follow up with Mr Kozakiewicz but was unsuccessful in reaching him  No answer. [facility] RN CM left HIPAA compliant voicemail message along with CM's contact info.    [facility]N RN CM notes [facility] SW unsuccessful attempts to reach Mr Vane also   Plan: [facility] RN CM sent an unsuccessful outreach letter and scheduled this patient for third call attempt and will plan for case closure if no response   Kimber[place] L. Lavina Hamman, RN, BSN, [place] Coordinator Office number [phone] Mobile number [phone]  Main [facility] number [phone] Fax number [phone]

## 2018-05-10 NOTE — Patient Outreach (Addendum)
[facility]) Care Management  05/10/2018  EVER GUSTAFSON 08/10/1971 [id number]   Final follow up attempt to Mr. Stickels to ensure receipt of transportation resources.  BSW left voicemail message.  BSW will close case if no return call by close of business.   Addendum: Transportation resources mailed to patient on 04/27/18.  Multiple unsuccessful follow up attempts made to ensure receipt.  BSW is closing case at this time.   Ronn Melena, BSW Social Worker [phone]

## 2018-05-11 ENCOUNTER — Other Ambulatory Visit: Payer: Self-pay | Admitting: *Deleted

## 2018-05-11 NOTE — Addendum Note (Signed)
Addended by: Barbaraann Faster on: 05/11/2018 05:38 PM   Modules accepted: Orders

## 2018-05-11 NOTE — Patient Outreach (Signed)
[place] ([place]) Care Management  05/11/2018  JAVAUN DIMPERIO 1971/06/27 [id number]  Care coordination/Return call from Mr Fernandez after CM left a message/telephone re assessment and referrals  Patient returned a call to [facility] RN CM Patient is able to verify HIPAA Reviewed and addressed follow up concerns plus reviewed the [facility] MD referral with patient  Mr Pinard confirms with CM he has recently been in a [facility] for gastroparesis and had a follow up hospital visit with Dr Nevada Crane. He discussed having to have an IV placed in his foot during the hospital visit. CM discussed this may have been related to dehydration  Mr Wight had an episode of emesis during the call He reports taking phenergan last at 0530 He was encouraged to take more phenergan as scheduled and to take in fluids to replace fluid loss with emesis and to remain hydrated He reports the trigger for this episode was the smell of the food his father was cooking. He went outside to speak further to CM When assessed he reports he is "always nauseated". CM discussed possible side effects from his medications He is on Januvia. He states he takes his medications with apple sauce "that is the only thing I can tolerate" Cm encouraged toast or meat intake prior to taking his medication to possibly help decrease triggering gastroparesis s/s. He states he is lactose intolerant   CM again discussed with Mr Hellinger the concern that he is not managing his medical conditions well at home and is seeking medical services from various EDs and hospitals ([facility], [facility], [facility] only able to see visits to [facility] or a cone facility, others not populating in care everywhere) He informs Cm he has been "titled as a frequent flyer" and does not feel good about this.  He reports that on top of this, he has an increased amount of medical bills he is having  difficulty in paying. CM discussed with him that these voiced concerns maybe reasons also to consider allowing [facility] staff to assist him with other options to learn how to manage his care at home.  He agrees. During the initial telephonic call to Mr Thetford he was denying concerns with home care management and need for [facility] referral and services  CM discussed that he has other options for managing his medical concerns and encouraged him to allow [facility] to assist, to utilize his MD offices and urgent care more. CM discussed and encouraged him to not "wait too late" for worsening symptoms before contacting his primary MD, GI MD, 24 hour nurse line and [facility] staff.  He states he will agree to assistance. CM encouraged him to return a call to [place] and he states he will. He also discussed being informed by his MD office to go to ED but admits being told this when he "wait too late" to report the s/s   When CM inquired on what he thinks he needs to better manage at home, he is now confirming he has thought and concluded that there are  3 things he would like to make goals for improvement  1) "eating habits", 2) "stress out" When asked to elaborate he reports he is worried about being able to afford his medicine all the time. He reports worrying about not having enough to pay his bills and for medicines. He is "getting paid" on 05/13/18 Thursday "that is when I get my check" and  3) "psychological" When CM assessed for clarity, Mr Durflinger reports because he has difficulty seeing he is not able to drive independently and therefore is not able to go anywhere by himself. He reports he can do his ADLs independently but needs assist with most iADLs and does not sleep well related to increase urination when he takes his fluid pills "I got up ten times last night."   He reports his wound is getting better per the [facility] RN. He has a follow up appointment with his wound care MD on 05/12/18   Social:  Mr Lambson has a physical address  in [place] and a po box mailing address in [place]. He reports he is on disability (SSI) He reports he is presently staying with his father in [place] "a lot" but is not his father's care giver.  He reports his father "is 37 five but acts 38 five" He confirms on today 05/11/18 he is physically in [place] -"about a mile" over [employer].  He is considering moving to [place] vs continuing to pay rent in [place], financial concerns He reports he is independent with his ADLs and needs assist with iADLs, He confirms he is no longer driving. He now reports  caregiver support from his father, sisters and friends but feels "psychologically bad" because he can not care for himself as he previous could and has to rely on his family and friends He reports his sisters and friends all work and he has to get transport assist based upon their availability  He is continues to be seen by a [place] agency called Estate manager/land agent (RN PT/OT) ordered by Dr Amalia Hailey Podiatry in June 2019 He reports the [facility] RN is presently seeing him every other day per the wound care MD orders at his father's home.  He reports having transportation to medical appointments with assist of his father or friends  He reports not graduating from high school but getting his GED   Conditions: Legally blind in right eye and has 20/30 vision in the left eye per pt, Ulceration of the right lateral foot secondary to DM, right toe amputation in January 2019, chronic kidney disease stage 2, obstructive sleep apnea, allergic rhinitis, GERD, acute pericarditis, HTN, DM type II, generalized edema, sob, iron deficiency anemia, morbid obesity, anxiety disorder, mixed hyperlipidemia, vitamin B 12 deficiency anemia, hx of falling, primary open angle glaucoma, lactose intolerant   Medications: Mr Goyer reports seeing Dr Juel Burrow pharmacy staff for a review of his medicines. He reports the cost of his medicines now to include a new eye gtt ordered on 05/10/18 is $300  and he is having difficulty affording them.  He reports "worrying about not being able to get my medicines" He reports side effects of "gastroparesis" from his medications  He sees a GI provider, A Cyndi Bender  He has a new eye drop ordered on 05/10/18 and the cost is $80 CM inquired if Dr Nevada Crane pharmacist has been contacted to offer assist from the eye drop's drug company. Mr Kydd states this has not been done but he will call to ask for drug pt assistance     Appointments: He went to see an eye MD on 05/10/18 His sister transported him He informs CM he plans to text Dr Nevada Crane after this call to get him to refill his medications to be delivered to include the new eye drops He reports being offered a sample of the eye drop once but none is available  per the MD at this time  He reports his wound is getting better per the [facility] RN. He has a follow up appointment with his wound care MD on 05/12/18   Advance Directives: He is not interested in advance directives as he discussed on initial assessment   Consent: [facility] RN CM reviewed [facility] services with patient. Patient gave verbal consent for services [facility] pharmacy and [place] RN CM. He is already active with [facility] SW and he was encouraged to return a call to SW today     Plan  [facility] RN CM will refer Mr Desroches to [place] for medication cost and side effect concerns [facility] RN CM will updated [facility] SW about financial concerns and possible pending return call from pt [facility] RN CM will refer Mr Hellmer to [place] RN CM for further complex  Routed note to Dr Marcella Dubs L. Lavina Hamman, RN, BSN, [place] Coordinator Office number [phone] Mobile number [phone]  Main [facility] number [phone] Fax number [phone]

## 2018-05-11 NOTE — Patient Outreach (Signed)
[facility]) Care Management  05/11/2018  Timothy Clark April 22, 1971 [id number]   Care coordination   Collaborated with [facility] SW  Pt was closed after no response to [facility] SW on 05/10/18   Plan  Timothy Clark was referred to [facility] SW for transportation resources that are available to take him to medical appointments in [place] and [place] when his father or friend is not available plus possible financial assistance for concerns with hospital bill and medications  Kimberly L. Lavina Hamman, RN, BSN, [place] Coordinator Office number [phone] Mobile number [phone]  Main [facility] number [phone] Fax number [phone]

## 2018-05-12 ENCOUNTER — Other Ambulatory Visit: Payer: Self-pay

## 2018-05-12 ENCOUNTER — Ambulatory Visit: Payer: Medicare HMO | Admitting: Podiatry

## 2018-05-12 ENCOUNTER — Telehealth: Payer: Self-pay | Admitting: *Deleted

## 2018-05-12 ENCOUNTER — Other Ambulatory Visit: Payer: Self-pay | Admitting: *Deleted

## 2018-05-12 DIAGNOSIS — I70235 Atherosclerosis of native arteries of right leg with ulceration of other part of foot: Secondary | ICD-10-CM

## 2018-05-12 DIAGNOSIS — E0842 Diabetes mellitus due to underlying condition with diabetic polyneuropathy: Secondary | ICD-10-CM

## 2018-05-12 DIAGNOSIS — L97512 Non-pressure chronic ulcer of other part of right foot with fat layer exposed: Secondary | ICD-10-CM

## 2018-05-12 NOTE — Patient Outreach (Signed)
Referral received from telephonic care manager for community care management, telephone call to pt to schedule home visit (if pt is not living in [place]), no answer to telephone [phone], no option to leave voicemail, unsuccessful outreach letter mailed to pt home.  PLAN Outreach pt in 3-4 business days  Jacqlyn Larsen [facility], [place] [phone]

## 2018-05-12 NOTE — Telephone Encounter (Addendum)
Faxed Dr. Amalia Hailey' 05/12/2018 2:52pm orders to [facility]. Unable to get fax to go through.

## 2018-05-12 NOTE — Telephone Encounter (Signed)
-----   Message from Edrick Kins, DPM sent at 05/12/2018  2:52 PM EDT ----- Regarding: Modify home Health Dressing changes Please modify home health dressing change orders to 2 times per week instead of 3 times per week.  Continue with the collagen and dry sterile dressing.  Thanks, Dr. Amalia Hailey

## 2018-05-13 ENCOUNTER — Ambulatory Visit: Payer: Self-pay | Admitting: *Deleted

## 2018-05-13 ENCOUNTER — Other Ambulatory Visit: Payer: Self-pay

## 2018-05-13 DIAGNOSIS — E1165 Type 2 diabetes mellitus with hyperglycemia: Secondary | ICD-10-CM | POA: Diagnosis not present

## 2018-05-13 DIAGNOSIS — E119 Type 2 diabetes mellitus without complications: Secondary | ICD-10-CM | POA: Diagnosis not present

## 2018-05-13 DIAGNOSIS — F209 Schizophrenia, unspecified: Secondary | ICD-10-CM | POA: Diagnosis not present

## 2018-05-13 DIAGNOSIS — R1110 Vomiting, unspecified: Secondary | ICD-10-CM | POA: Diagnosis not present

## 2018-05-13 DIAGNOSIS — S0990XA Unspecified injury of head, initial encounter: Secondary | ICD-10-CM | POA: Diagnosis not present

## 2018-05-13 DIAGNOSIS — R410 Disorientation, unspecified: Secondary | ICD-10-CM | POA: Diagnosis not present

## 2018-05-13 DIAGNOSIS — R404 Transient alteration of awareness: Secondary | ICD-10-CM | POA: Diagnosis not present

## 2018-05-13 DIAGNOSIS — Z79899 Other long term (current) drug therapy: Secondary | ICD-10-CM | POA: Diagnosis not present

## 2018-05-13 DIAGNOSIS — E876 Hypokalemia: Secondary | ICD-10-CM | POA: Diagnosis not present

## 2018-05-13 DIAGNOSIS — Z9119 Patient's noncompliance with other medical treatment and regimen: Secondary | ICD-10-CM | POA: Diagnosis not present

## 2018-05-13 DIAGNOSIS — R1084 Generalized abdominal pain: Secondary | ICD-10-CM | POA: Diagnosis not present

## 2018-05-13 DIAGNOSIS — Z9114 Patient's other noncompliance with medication regimen: Secondary | ICD-10-CM | POA: Diagnosis not present

## 2018-05-13 DIAGNOSIS — G8929 Other chronic pain: Secondary | ICD-10-CM | POA: Diagnosis not present

## 2018-05-13 DIAGNOSIS — R4182 Altered mental status, unspecified: Secondary | ICD-10-CM | POA: Diagnosis not present

## 2018-05-13 DIAGNOSIS — I10 Essential (primary) hypertension: Secondary | ICD-10-CM | POA: Diagnosis not present

## 2018-05-13 DIAGNOSIS — R109 Unspecified abdominal pain: Secondary | ICD-10-CM | POA: Diagnosis not present

## 2018-05-13 DIAGNOSIS — R51 Headache: Secondary | ICD-10-CM | POA: Diagnosis not present

## 2018-05-13 DIAGNOSIS — Z7984 Long term (current) use of oral hypoglycemic drugs: Secondary | ICD-10-CM | POA: Diagnosis not present

## 2018-05-13 NOTE — Patient Outreach (Signed)
[facility]) Care Management  05/13/2018  JONATHON TAN 1971-02-07 [id number]   47 year old male referred to [place] Management.  [place] services requested for .  PMHx includes, but not limited to, hypertension, GERD, diabetes type 2 mellitus, CKD Stage 3 and hyperlipidemia.   Unsuccessful outreach attempt #1.  Left HIPAA compliant voice message requesting a return call.  Plan: Outreach attempt #2 in 3-4 business days.  Joetta Manners, PharmD Clinical Pharmacist [place] [phone]

## 2018-05-13 NOTE — Telephone Encounter (Signed)
Reviewed clinicals for correct fax, and faxed orders to [facility].

## 2018-05-14 ENCOUNTER — Other Ambulatory Visit: Payer: Self-pay

## 2018-05-14 DIAGNOSIS — N183 Chronic kidney disease, stage 3 (moderate): Secondary | ICD-10-CM | POA: Diagnosis not present

## 2018-05-14 DIAGNOSIS — L97512 Non-pressure chronic ulcer of other part of right foot with fat layer exposed: Secondary | ICD-10-CM | POA: Diagnosis not present

## 2018-05-14 DIAGNOSIS — E11621 Type 2 diabetes mellitus with foot ulcer: Secondary | ICD-10-CM | POA: Diagnosis not present

## 2018-05-14 DIAGNOSIS — E1122 Type 2 diabetes mellitus with diabetic chronic kidney disease: Secondary | ICD-10-CM | POA: Diagnosis not present

## 2018-05-14 DIAGNOSIS — I130 Hypertensive heart and chronic kidney disease with heart failure and stage 1 through stage 4 chronic kidney disease, or unspecified chronic kidney disease: Secondary | ICD-10-CM | POA: Diagnosis not present

## 2018-05-14 DIAGNOSIS — I5032 Chronic diastolic (congestive) heart failure: Secondary | ICD-10-CM | POA: Diagnosis not present

## 2018-05-14 NOTE — Patient Outreach (Signed)
[facility]) Care Management  05/14/2018  MARQUIZ SOTELO [birth year] [id number]   Patient was referred to [place] work again for financial, transportation, and housing needs.  BSW attempted to reach him today but had to leave a voicemail message.  BSW will attempt to reach him again within four business days.  Ronn Melena, BSW Social Worker [phone]

## 2018-05-15 NOTE — Progress Notes (Signed)
   HPI: 47 year old male with history of diabetes mellitus and partial first ray amputation presents for follow-up treatment evaluation regarding an ulcer to the lateral aspect of the right foot. He states the wound is improving appropriately. He reports home health is changing his dressing every other day. He denies any new complaints at this time. Patient is here for further evaluation and treatment.   Past Medical History:  Diagnosis Date  . Chronic abdominal pain   . Esophagitis   . Eye hemorrhage   . Gastritis   . Gastroparesis   . GERD (gastroesophageal reflux disease)   . Glaucoma   . Helicobacter pylori (H. pylori) infection 2016   ERADICATION DOCUMENTED VIA STOOL TEST in AUG 2016 at [place]  . Hiccups   . Hypertension   . Nausea and vomiting    chronic, recurrent  . Type 2 diabetes mellitus with complications ([place])    diagnosed around age 73     Physical Exam: General: The patient is alert and oriented x3 in no acute distress.  Dermatology: Ulcer noted to the lateral aspect of the right foot measuring approximately 3.5 x 0.8 x 0.1 cm.  To the noted ulcerations there is no eschar.  There is a moderate amount of slough fibrin necrotic tissue noted.  Wound base is granular.  There is no exposed bone muscle tendon ligament or joint at the moment.  No malodor.  There is a moderate amount of serosanguineous drainage noted.  Periwound integrity is intact.  Vascular: Moderate edema located to the lower extremity bilateral.  Pedal pulses are faintly palpable.  Neurological: Epicritic and protective threshold absent bilaterally.   Musculoskeletal Exam: History of partial first ray amputation right foot noted.  Assessment: 1. Ulceration of the right lateral foot secondary to DM   Plan of Care:  1. Patient evaluated.  2. Medically necessary excisional debridement including subcutaneous tissue was performed using a tissue nipper and a chisel blade. Excisional debridement of all  the necrotic nonviable tissue down to healthy bleeding viable tissue was performed with post-debridement measurements same as pre-. 3. The wound was cleansed and dry sterile dressing applied. 4. Continue using Prisma and dry sterile dressing.  5. Continue home health dressing changes twice weekly.  6. Return to clinic in 4 weeks.      Edrick Kins, DPM [facility]  Dr. Edrick Kins, DPM    [address], [place] [address]                Office [phone]  Fax [phone]

## 2018-05-17 ENCOUNTER — Other Ambulatory Visit: Payer: Self-pay

## 2018-05-17 ENCOUNTER — Ambulatory Visit: Payer: Self-pay

## 2018-05-17 ENCOUNTER — Other Ambulatory Visit: Payer: Self-pay | Admitting: *Deleted

## 2018-05-17 NOTE — Patient Outreach (Signed)
[name]) Care Management  [place]   05/17/2018  Timothy Clark 12/17/1970 [id number]  47 year old male referred to [place] Management.  [place] services requested for .  PMHx includes, but not limited to, hypertension, GERD, diabetes type 2 mellitus, CKD Stage 3 and hyperlipidemia.   Successful outreach attempt to Timothy Clark.  HIPAA identifiers verified.   Referral medication(s): "eye drop" Current insurance:HTA Currently receiving Extra Help:  _0  Yes _1  No _2  Unknown  Medication Assistance: Timothy Clark reports that he is in the coverage gap and is having trouble affording his Januvia and Brimonidine ophthalmic.  He was recently prescribed Rocklatan opthalmic, but reports it was $800 and he never picked it up.  He states that his eye doctor is aware. Timothy Clark states that he almost blind and has trouble drawing up his insulin from the vials.  He cannot afford the pens because he states they cost ~$300.  He states that he has tried to get Medicaid, but was told that he makes too much money.  He reports that he doesn't think he has ever applied for Extra Help LIS.  Per financial discussion, he may qualify for partial Extra Help.  Patient requests that I call him tomorrow morning to complete the online Extra Help LIS application.  Medication Management: Per referral note, patient is experiencing diabetes induced gastroparesis.   He is on metoclopramide 10 mg four times daily and reports that he has also tried Erythromycin in the past.  There are no other options available at this time.    Metoclopramide- CrCL </= 60 ml/min: 5 mg 4 times daily (maximum of 20m/day)  Objective:  HgA1c 6.7% in 3/19 SCr 1.460mdL in 4/19, estimated CrCl 64 ml/min  Current Medications: Current Outpatient Medications  Medication Sig Dispense Refill  . atorvastatin (LIPITOR) 10 MG tablet Take 10 mg by mouth at bedtime.     . Blood Glucose Monitoring Suppl (PRODIGY  AUTOCODE BLOOD GLUCOSE) w/Device KIT     . brimonidine (ALPHAGAN) 0.2 % ophthalmic solution Place 1 drop into both eyes 2 (two) times daily.    . cloNIDine (CATAPRES) 0.2 MG tablet Take 0.2 mg by mouth 2 (two) times daily.    . cyanocobalamin (,VITAMIN B-12,) 1000 MCG/ML injection INJECT 1 ML (CC) INTRAMUSCULARLY ONCE A WEEK FOR 4 WEEKS THEN MONTHLY  1  . dorzolamide (TRUSOPT) 2 % ophthalmic solution INSTILL 1 DROPS INTO EACH EYE TWICE DAILY  11  . hydrALAZINE (APRESOLINE) 50 MG tablet Take 1 tablet (50 mg total) by mouth 3 (three) times daily. 90 tablet 6  . insulin aspart protamine- aspart (NOVOLOG MIX 70/30) (70-30) 100 UNIT/ML injection Inject 0.25 mLs (25 Units total) into the skin 2 (two) times daily with a meal. 10 mL 11  . lisinopril (PRINIVIL,ZESTRIL) 5 MG tablet Take 5 mg by mouth daily.    . Marland KitchenORazepam (ATIVAN) 1 MG tablet Take 1 mg by mouth 2 (two) times daily as needed. for anxiety  3  . metoCLOPramide (REGLAN) 10 MG tablet Take 10 mg by mouth 4 (four) times daily -  before meals and at bedtime.  0  . pantoprazole (PROTONIX) 40 MG tablet Take 40 mg by mouth 2 (two) times daily.    . potassium chloride SA (K-DUR,KLOR-CON) 20 MEQ tablet Take 1 tablet (20 mEq total) by mouth 2 (two) times daily. 20 tablet 0  . sitaGLIPtin (JANUVIA) 50 MG tablet Take 50 mg by mouth daily.    . sucralfate (  CARAFATE) 1 g tablet Take 1 g by mouth 4 (four) times daily.     . timolol (TIMOPTIC) 0.5 % ophthalmic solution Place 1 drop into both eyes 2 (two) times daily.    [name] torsemide (DEMADEX) 20 MG tablet TAKE 1 TABLET BY MOUTH TWICE DAILY AT 7 AM AND 1 PM  5  . promethazine (PHENERGAN) 25 MG tablet Take 0.5 tablets (12.5 mg total) by mouth every 6 (six) hours as needed for nausea or vomiting. To one tablet every 6 hours (Patient not taking: Reported on 05/17/2018) 40 tablet 3   No current facility-administered medications for this visit.   .   Medication Assistance Findings:  Extra Help:   _0  Already  receiving Full Extra Help  _1  Already receiving Partial Extra Help  _2  May be eligible based on reported income  _3  Not Eligible based on reported income  Patient Assistance Programs: 1) Januvia made by DIRECTV o Income requirement met: _4  Yes _5  No         2)  Alphaghan P made by [employer] o Income requirement met: _6  Yes _7  No   Additional medication assistance options reviewed with patient as warranted:   _8  Tier Exception  _9  Mail Order Programs  _10  [employer]- non available  _11  Insurance OTC catalogues  _12  Coupons and [employer]  _13  No other options available  Plan: Outreach to Mr. Lindeman 10/8 am to complete Extra Help LIS online application.  Find out name of his eye doctor.  Send patient assistance application letter.  Joetta Manners, PharmD Clinical Pharmacist [place] [phone]

## 2018-05-17 NOTE — Patient Outreach (Signed)
Telephone call to pt (2nd attempt), no answer to telephone [phone] and no option to leave voicemail.  PLAN Outreach pt in 3-4 business days  Jacqlyn Larsen [facility], [place] [phone]

## 2018-05-17 NOTE — Patient Outreach (Signed)
[facility]) Care Management  05/17/2018  SHERRON MUMMERT 1971/02/01 [id number]   BSW received return call from Mr. Lenore Manner regarding social work referral for transportation, financial, and housing resources.  Transportation:  Mr. Fells was previously referred for assistance with transportation resources.  At that time, he was informed of this benefit through [facility] and was provided with contact information and the process for scheduling.  BSW also provided him with information about [employer] transportation.  Mr. Omahoney confirmed receipt of resource information sent and verbalized understanding today regarding how to utilize these resources. He reported that his father takes him to most of his medical appointments and he can utilize the above resources if his father is not available.  Financial: Mr. Sudbeck reported that [facility] Pharmacist is assisting him with Extra Help LIS application as affording his medications is his biggest financial concern at this time.  He has applied for Medicaid in the past but was denied due to being over income limit.  Mr. Pickar declined other financial resources. Housing: Mr. Cambria permanent address is in [place].  He reported that he typically stays with his father in [place] if he has a medical appointment because his father transports him.  Mr. Lingard denied the need for housing resources.  BSW is closing case at this time but encouraged Mr. Fieldhouse to call if other needs arise.   Ronn Melena, BSW Social Worker [phone]

## 2018-05-18 ENCOUNTER — Ambulatory Visit: Payer: Self-pay

## 2018-05-18 ENCOUNTER — Other Ambulatory Visit: Payer: Self-pay

## 2018-05-18 DIAGNOSIS — L97512 Non-pressure chronic ulcer of other part of right foot with fat layer exposed: Secondary | ICD-10-CM | POA: Diagnosis not present

## 2018-05-18 DIAGNOSIS — E1122 Type 2 diabetes mellitus with diabetic chronic kidney disease: Secondary | ICD-10-CM | POA: Diagnosis not present

## 2018-05-18 DIAGNOSIS — N183 Chronic kidney disease, stage 3 (moderate): Secondary | ICD-10-CM | POA: Diagnosis not present

## 2018-05-18 DIAGNOSIS — I5032 Chronic diastolic (congestive) heart failure: Secondary | ICD-10-CM | POA: Diagnosis not present

## 2018-05-18 DIAGNOSIS — E11621 Type 2 diabetes mellitus with foot ulcer: Secondary | ICD-10-CM | POA: Diagnosis not present

## 2018-05-18 DIAGNOSIS — I130 Hypertensive heart and chronic kidney disease with heart failure and stage 1 through stage 4 chronic kidney disease, or unspecified chronic kidney disease: Secondary | ICD-10-CM | POA: Diagnosis not present

## 2018-05-18 NOTE — Patient Outreach (Signed)
[facility] [facility]) Care Management  05/18/2018  ADREAN HEITZ 1970/11/29 [id number]  47year old malereferred to [place] Management.[place] services requested for . PMHx includes, but not limited to, hypertension, GERD, diabetes type 2 mellitus, CKD Stage 3 and hyperlipidemia.   Unsuccessful outreach attempt #1.  Left HIPAA compliant voice message requesting a return call.   Plan: Outreach to Mr. Economou 10/11 am to complete Extra Help LIS online application.  Find out name of his eye doctor.  Send patient assistance application letter.  Joetta Manners, PharmD Clinical Pharmacist [place] [phone]

## 2018-05-20 ENCOUNTER — Other Ambulatory Visit: Payer: Self-pay | Admitting: *Deleted

## 2018-05-20 NOTE — Patient Outreach (Signed)
Telephone call to pt (3rd attempt) outreach for care management services, care planning, no answer to telephone, left voicemail requesting return phone call.  PLAN Close case in 3 business days if no contact from pt  Timothy Clark [facility], [place] [phone]

## 2018-05-21 ENCOUNTER — Other Ambulatory Visit: Payer: Self-pay

## 2018-05-21 ENCOUNTER — Ambulatory Visit: Payer: Self-pay

## 2018-05-21 ENCOUNTER — Other Ambulatory Visit: Payer: Self-pay | Admitting: Pharmacy Technician

## 2018-05-21 DIAGNOSIS — E11621 Type 2 diabetes mellitus with foot ulcer: Secondary | ICD-10-CM | POA: Diagnosis not present

## 2018-05-21 DIAGNOSIS — I5032 Chronic diastolic (congestive) heart failure: Secondary | ICD-10-CM | POA: Diagnosis not present

## 2018-05-21 DIAGNOSIS — L97512 Non-pressure chronic ulcer of other part of right foot with fat layer exposed: Secondary | ICD-10-CM | POA: Diagnosis not present

## 2018-05-21 DIAGNOSIS — I130 Hypertensive heart and chronic kidney disease with heart failure and stage 1 through stage 4 chronic kidney disease, or unspecified chronic kidney disease: Secondary | ICD-10-CM | POA: Diagnosis not present

## 2018-05-21 DIAGNOSIS — N183 Chronic kidney disease, stage 3 (moderate): Secondary | ICD-10-CM | POA: Diagnosis not present

## 2018-05-21 DIAGNOSIS — E1122 Type 2 diabetes mellitus with diabetic chronic kidney disease: Secondary | ICD-10-CM | POA: Diagnosis not present

## 2018-05-21 NOTE — Patient Outreach (Signed)
[place] [facility]) Care Management  05/21/2018  Timothy Clark 1970-10-07 [id number]   Received Allergan (Alphagan P) and Merck [name]) patient assistance referral from [place]. Prepared patient portion of applications to be mailed. Prepared provider portion of Merck to be mailed to [name] and faxed provider portion of Allergan to Dr. Hassell Done.  Will follow up with patient in 5-7 business days to confirm applications have been received.  [name] [place], [place] Management [phone]

## 2018-05-21 NOTE — Patient Outreach (Addendum)
[facility]) Care Management  [place]  05/21/2018  Timothy Clark 1971/03/18 [id number]  Reason for referral: medication assistance  Unsuccessful telephone call attempt # 2 to patient.   Left HIPAA compliant voice message requesting a return call.  Plan:  I will make another outreach attempt to patient within 3-4 business days.  Joetta Manners, PharmD Clinical Pharmacist [place] [phone]  Addendum: Incoming call received from Timothy Clark.   HIPAA identifiers verified.   Timothy Clark states that he had received Extra Help LIS in the past and he was required to re-submit an application this past year.  He states he received a letter saying they needed more information, but he didn't know what else they needed, so he never mailed in the forms.  I submitted the Extra Help LIS online application for him today.  Medication Assistance Findings:  Extra Help:   '[]'  Already receiving Full Extra Help  '[]'  Already receiving Partial Extra Help  '[x]'  May be eligible based on reported income and assets (applied 05/21/18)   '[]'  Not Eligible based on reported income and assets  Patient Assistance Programs: 1) Januvia made by DIRECTV  o Income requirement met: '[x]'  Yes '[]'  No '[]'  Unknown o [place]op requirement met:    '[]'  Yes '[]'  No  '[]'  Unknown  '[x]'  Not applicable        2)  Alphaghan P by Allergan o Income requirement met: '[x]'  Yes '[]'  No  '[]'  Unknown o Troop requirement met:   '[]'  Yes '[]'  No   '[]'  Unknown '[x]'  Not applicable   Plan: Follow up with Timothy Clark in 4-6 weeks to determine if he received Extra Help LIS.  I will route patient assistance letter to [place] technician who will coordinate patient assistance program application process for medications listed above.  [facility] pharmacy technician will assist with obtaining all required documents from both patient and provider(s) and submit application(s) once completed.    Joetta Manners,  PharmD Clinical Pharmacist [place] [phone]

## 2018-05-24 DIAGNOSIS — E113512 Type 2 diabetes mellitus with proliferative diabetic retinopathy with macular edema, left eye: Secondary | ICD-10-CM | POA: Diagnosis not present

## 2018-05-24 DIAGNOSIS — E113591 Type 2 diabetes mellitus with proliferative diabetic retinopathy without macular edema, right eye: Secondary | ICD-10-CM | POA: Diagnosis not present

## 2018-05-24 DIAGNOSIS — H35372 Puckering of macula, left eye: Secondary | ICD-10-CM | POA: Diagnosis not present

## 2018-05-24 DIAGNOSIS — H3589 Other specified retinal disorders: Secondary | ICD-10-CM | POA: Diagnosis not present

## 2018-05-24 DIAGNOSIS — H4720 Unspecified optic atrophy: Secondary | ICD-10-CM | POA: Diagnosis not present

## 2018-05-25 ENCOUNTER — Other Ambulatory Visit: Payer: Self-pay | Admitting: *Deleted

## 2018-05-25 DIAGNOSIS — E1122 Type 2 diabetes mellitus with diabetic chronic kidney disease: Secondary | ICD-10-CM | POA: Diagnosis not present

## 2018-05-25 DIAGNOSIS — L97512 Non-pressure chronic ulcer of other part of right foot with fat layer exposed: Secondary | ICD-10-CM | POA: Diagnosis not present

## 2018-05-25 DIAGNOSIS — I5032 Chronic diastolic (congestive) heart failure: Secondary | ICD-10-CM | POA: Diagnosis not present

## 2018-05-25 DIAGNOSIS — E11621 Type 2 diabetes mellitus with foot ulcer: Secondary | ICD-10-CM | POA: Diagnosis not present

## 2018-05-25 DIAGNOSIS — N183 Chronic kidney disease, stage 3 (moderate): Secondary | ICD-10-CM | POA: Diagnosis not present

## 2018-05-25 DIAGNOSIS — I130 Hypertensive heart and chronic kidney disease with heart failure and stage 1 through stage 4 chronic kidney disease, or unspecified chronic kidney disease: Secondary | ICD-10-CM | POA: Diagnosis not present

## 2018-05-25 NOTE — Patient Outreach (Signed)
Pt did not return phone call to RN CM, case closed for nursing,  In basket sent to [facility] pharmacist Joetta Manners informing of case closure.  RN CM mailed case closure letter to patient's home and primary MD.  [facility] pharmacist continues to outreach pt.  PLAN Case closed for RN care management  Jacqlyn Larsen [facility], [place] Coordinator [phone]

## 2018-05-27 ENCOUNTER — Ambulatory Visit: Payer: Self-pay

## 2018-05-28 ENCOUNTER — Ambulatory Visit: Payer: Medicare HMO | Admitting: Gastroenterology

## 2018-05-28 ENCOUNTER — Encounter: Payer: Self-pay | Admitting: Gastroenterology

## 2018-05-28 VITALS — BP 142/82 | HR 80 | Temp 98.2°F | Ht 72.0 in | Wt 335.2 lb

## 2018-05-28 DIAGNOSIS — E1122 Type 2 diabetes mellitus with diabetic chronic kidney disease: Secondary | ICD-10-CM | POA: Diagnosis not present

## 2018-05-28 DIAGNOSIS — K3184 Gastroparesis: Secondary | ICD-10-CM | POA: Diagnosis not present

## 2018-05-28 DIAGNOSIS — E11621 Type 2 diabetes mellitus with foot ulcer: Secondary | ICD-10-CM | POA: Diagnosis not present

## 2018-05-28 DIAGNOSIS — N183 Chronic kidney disease, stage 3 (moderate): Secondary | ICD-10-CM | POA: Diagnosis not present

## 2018-05-28 DIAGNOSIS — E1143 Type 2 diabetes mellitus with diabetic autonomic (poly)neuropathy: Secondary | ICD-10-CM

## 2018-05-28 DIAGNOSIS — E785 Hyperlipidemia, unspecified: Secondary | ICD-10-CM | POA: Diagnosis not present

## 2018-05-28 DIAGNOSIS — I130 Hypertensive heart and chronic kidney disease with heart failure and stage 1 through stage 4 chronic kidney disease, or unspecified chronic kidney disease: Secondary | ICD-10-CM | POA: Diagnosis not present

## 2018-05-28 DIAGNOSIS — I10 Essential (primary) hypertension: Secondary | ICD-10-CM | POA: Diagnosis not present

## 2018-05-28 DIAGNOSIS — D509 Iron deficiency anemia, unspecified: Secondary | ICD-10-CM | POA: Diagnosis not present

## 2018-05-28 DIAGNOSIS — D519 Vitamin B12 deficiency anemia, unspecified: Secondary | ICD-10-CM | POA: Diagnosis not present

## 2018-05-28 DIAGNOSIS — E782 Mixed hyperlipidemia: Secondary | ICD-10-CM | POA: Diagnosis not present

## 2018-05-28 DIAGNOSIS — E1165 Type 2 diabetes mellitus with hyperglycemia: Secondary | ICD-10-CM | POA: Diagnosis not present

## 2018-05-28 DIAGNOSIS — L97512 Non-pressure chronic ulcer of other part of right foot with fat layer exposed: Secondary | ICD-10-CM | POA: Diagnosis not present

## 2018-05-28 DIAGNOSIS — I5032 Chronic diastolic (congestive) heart failure: Secondary | ICD-10-CM | POA: Diagnosis not present

## 2018-05-28 NOTE — Progress Notes (Signed)
Referring Provider: [name], MD Primary Care Physician:  [name], MD  Primary GI: Dr. Gala Romney   Chief Complaint  Patient presents with  . gastroparesis    f/u.  [name] Nausea    every AM but no vomiting in a few weeks    HPI:   ARBOR COHEN is a 47 y.o. male presenting today with a history of chronic abdominal pain, esophagitis, gastritis, gastroparesis, history of H.pylori s/p eradication. He needs initial screening colonoscopy at some point. EGD at [facility] Jan 2017 with mild esophagitis and old gastric contents. RUQ ultrasound March 2019: normal. HIDA last in May 2016: EF 92%. CT abd/pelvis with contrast in Feb 2019 at [facility]: unremarkable.   When last seen, given Dexilant samples. Insurance wouldn't cover. Now on Protonix BID. Reglan discontinued, took self off Marinol. Caused worsening of vomiting. No longer on dicyclomine. Just taking Phenergan as needed. Hasn't taken any in a week.   States he wonders if his BP was affecting his nausea. With improvement in BP, he notes improvement in nausea/vomiting. No ED visit in 3 weeks, which he is excited about. He has fluid "all over him". When leaving here, goes to see [name].  Doesn't eat a full meal after 6. Generalized anasarca, with significant lower extremity swelling, upper extremity edema including hands. He reports scrotal edema as well.    Past Medical History:  Diagnosis Date  . Chronic abdominal pain   . Esophagitis   . Eye hemorrhage   . Gastritis   . Gastroparesis   . GERD (gastroesophageal reflux disease)   . Glaucoma   . Helicobacter pylori (H. pylori) infection 2016   ERADICATION DOCUMENTED VIA STOOL TEST in AUG 2016 at [place]  . Hiccups   . Hypertension   . Nausea and vomiting    chronic, recurrent  . Type 2 diabetes mellitus with complications (HCC)    diagnosed around age 50    Past Surgical History:  Procedure Laterality Date  . CATARACT EXTRACTION W/PHACO Left 05/19/2016   Procedure:  CATARACT EXTRACTION PHACO AND INTRAOCULAR LENS PLACEMENT LEFT EYE;  Surgeon: Tonny Branch, MD;  Location: [facility];  Service: Ophthalmology;  Laterality: Left;  CDE: 7.30  . CATARACT EXTRACTION W/PHACO Right 06/23/2016   Procedure: CATARACT EXTRACTION PHACO AND INTRAOCULAR LENS PLACEMENT RIGHT EYE CDE=9.87;  Surgeon: Tonny Branch, MD;  Location: [facility];  Service: Ophthalmology;  Laterality: Right;  right  . ESOPHAGOGASTRODUODENOSCOPY  2015   Dr. Britta Mccreedy  . ESOPHAGOGASTRODUODENOSCOPY N/A 10/26/2014   RMR: Distal esophagititis-likely reflux related although an element of pill induced injuruy no exclueded  status post biopsy. Diffusely abnormal gastric mucosa of uncertain signigicane -status post gastric biopsy. Focal area of excoriation in the cardia most consistant with a trauma of heaving.   . ESOPHAGOGASTRODUODENOSCOPY  08/2015   [facility]: mild esophagitis and old gastric contents  . EYE SURGERY  2015   stent placed to left eye  . EYE SURGERY    . INCISION AND DRAINAGE OF WOUND Right 07/16/2017   Procedure: IRRIGATION AND DEBRIDEMENT OF SOFT TISSUE OF ULCERATION RIGHT FOOT;  Surgeon: Caprice Beaver, DPM;  Location: [facility];  Service: Podiatry;  Laterality: Right;    Current Outpatient Medications  Medication Sig Dispense Refill  . atorvastatin (LIPITOR) 10 MG tablet Take 10 mg by mouth at bedtime.     . Blood Glucose Monitoring Suppl (PRODIGY AUTOCODE BLOOD GLUCOSE) w/Device KIT     . brimonidine (ALPHAGAN) 0.2 % ophthalmic solution  Place 1 drop into both eyes 2 (two) times daily.    . cloNIDine (CATAPRES) 0.2 MG tablet Take 0.2 mg by mouth 2 (two) times daily.    . cyanocobalamin (,VITAMIN B-12,) 1000 MCG/ML injection INJECT 1 ML (CC) INTRAMUSCULARLY ONCE A WEEK FOR 4 WEEKS THEN MONTHLY  1  . dorzolamide (TRUSOPT) 2 % ophthalmic solution INSTILL 1 DROPS INTO EACH EYE TWICE DAILY  11  . hydrALAZINE (APRESOLINE) 50 MG tablet Take 1 tablet (50 mg total) by mouth 3 (three) times daily. 90 tablet 6  .  insulin aspart protamine- aspart (NOVOLOG MIX 70/30) (70-30) 100 UNIT/ML injection Inject 0.25 mLs (25 Units total) into the skin 2 (two) times daily with a meal. 10 mL 11  . lisinopril (PRINIVIL,ZESTRIL) 5 MG tablet Take 5 mg by mouth daily.    [name] LORazepam (ATIVAN) 1 MG tablet Take 1 mg by mouth 2 (two) times daily as needed. for anxiety  3  . pantoprazole (PROTONIX) 40 MG tablet Take 40 mg by mouth 2 (two) times daily.    . potassium chloride SA (K-DUR,KLOR-CON) 20 MEQ tablet Take 1 tablet (20 mEq total) by mouth 2 (two) times daily. 20 tablet 0  . promethazine (PHENERGAN) 25 MG tablet Take 0.5 tablets (12.5 mg total) by mouth every 6 (six) hours as needed for nausea or vomiting. To one tablet every 6 hours 40 tablet 3  . sitaGLIPtin (JANUVIA) 50 MG tablet Take 50 mg by mouth daily.    . sucralfate (CARAFATE) 1 g tablet Take 1 g by mouth 4 (four) times daily.     . timolol (TIMOPTIC) 0.5 % ophthalmic solution Place 1 drop into both eyes 2 (two) times daily.    [name] torsemide (DEMADEX) 20 MG tablet TAKE 1 TABLET BY MOUTH TWICE DAILY AT 7 AM AND 1 PM  5   No current facility-administered medications for this visit.     Allergies as of 05/28/2018 - Review Complete 05/28/2018  Allergen Reaction Noted  . Donnatal [pb-hyoscy-atropine-scopolamine] Anaphylaxis 07/12/2015  . Fish allergy Anaphylaxis, Hives, and Rash 01/14/2015  . Haldol [haloperidol lactate] Other (See Comments) 08/07/2015  . Lidocaine Anaphylaxis 07/12/2015  . Maalox [calcium carbonate antacid] Anaphylaxis 07/12/2015  . Lisinopril Swelling 10/30/2017  . Marinol [dronabinol]  05/28/2018  . Aspirin Hives and Itching 03/22/2009  . Bactrim [sulfamethoxazole-trimethoprim] Itching 12/06/2014  . Doxycycline Hives and Nausea And Vomiting 03/20/2014  . Keflex [cephalexin] Hives, Itching, and Nausea And Vomiting 06/22/2017  . Penicillins Hives and Itching 03/22/2009    Family History  Problem Relation Age of Onset  . Ovarian cancer  Mother   . Heart attack Father   . Cervical cancer Sister   . Diabetes Sister   . Colon cancer Neg Hx     Social History   Socioeconomic History  . Marital status: Single    Spouse name: Not on file  . Number of children: Not on file  . Years of education: Not on file  . Highest education level: Not on file  Occupational History  . Not on file  Social Needs  . Financial resource strain: Not on file  . Food insecurity:    Worry: Not on file    Inability: Not on file  . Transportation needs:    Medical: Not on file    Non-medical: Not on file  Tobacco Use  . Smoking status: Never Smoker  . Smokeless tobacco: Never Used  Substance and Sexual Activity  . Alcohol use: No    Alcohol/week:  0.0 standard drinks  . Drug use: No  . Sexual activity: Yes  Lifestyle  . Physical activity:    Days per week: Not on file    Minutes per session: Not on file  . Stress: Not on file  Relationships  . Social connections:    Talks on phone: Not on file    Gets together: Not on file    Attends religious service: Not on file    Active member of club or organization: Not on file    Attends meetings of clubs or organizations: Not on file    Relationship status: Not on file  Other Topics Concern  . Not on file  Social History Narrative  . Not on file    Review of Systems: Gen: Denies fever, chills, anorexia. Denies fatigue, weakness, weight loss.  CV: see HPI  Resp: Denies dyspnea at rest, cough, wheezing, coughing up blood, and pleurisy. GI: as mentioned in HPI  Derm: Denies rash, itching, dry skin Psych: Denies depression, anxiety, memory loss, confusion. No homicidal or suicidal ideation.  Heme: Denies bruising, bleeding, and enlarged lymph nodes.  Physical Exam: BP (!) 142/82   Pulse 80   Temp 98.2 F (36.8 C) (Oral)   Ht 6' (1.829 m)   Wt (!) 335 lb 3.2 oz (152 kg)   BMI 45.46 kg/m  General:   Alert and oriented. No distress noted. Pleasant and cooperative.  Head:   Normocephalic and atraumatic. Eyes:  Conjuctiva clear without scleral icterus. Mouth:  Oral mucosa pink and moist.  Abdomen:  +BS, soft, non-tender and non-distended. No rebound or guarding. No HSM or masses noted. Msk:  Symmetrical without gross deformities. Normal posture. Extremities:  Generalized edema bilateral lower extremities to thigh, upper extremities/hands, PCP AWARE  Neurologic:  Alert and  oriented x4 Psych:  Alert and cooperative. Normal mood and affect.

## 2018-05-28 NOTE — Patient Instructions (Signed)
Continue Protonix twice a day and Phenergan as needed.  We will see you in 3 months and arrange a colonoscopy!!  I enjoyed seeing you again today! As you know, I value our relationship and want to provide genuine, compassionate, and quality care. I welcome your feedback. If you receive a survey regarding your visit,  I greatly appreciate you taking time to fill this out. See you next time!  Annitta Needs, PhD, ANP-BC [facility]

## 2018-05-30 DIAGNOSIS — N183 Chronic kidney disease, stage 3 (moderate): Secondary | ICD-10-CM | POA: Diagnosis not present

## 2018-05-30 DIAGNOSIS — H548 Legal blindness, as defined in USA: Secondary | ICD-10-CM | POA: Diagnosis not present

## 2018-05-30 DIAGNOSIS — E1122 Type 2 diabetes mellitus with diabetic chronic kidney disease: Secondary | ICD-10-CM | POA: Diagnosis not present

## 2018-05-30 DIAGNOSIS — E118 Type 2 diabetes mellitus with unspecified complications: Secondary | ICD-10-CM | POA: Diagnosis not present

## 2018-05-30 DIAGNOSIS — I509 Heart failure, unspecified: Secondary | ICD-10-CM | POA: Diagnosis not present

## 2018-05-30 DIAGNOSIS — M86179 Other acute osteomyelitis, unspecified ankle and foot: Secondary | ICD-10-CM | POA: Diagnosis not present

## 2018-05-31 NOTE — Assessment & Plan Note (Signed)
Doing well now with Protonix BID (Dexilant not covered by insurance), and only taking Phenergan as needed. Multiple other non-GI issues currently, which PCP is aware. Return in 3 months to arrange initial screening colonoscopy once other health issues are addressed.

## 2018-05-31 NOTE — Progress Notes (Signed)
CC'D TO PCP °

## 2018-06-01 DIAGNOSIS — E1122 Type 2 diabetes mellitus with diabetic chronic kidney disease: Secondary | ICD-10-CM | POA: Diagnosis not present

## 2018-06-01 DIAGNOSIS — N183 Chronic kidney disease, stage 3 (moderate): Secondary | ICD-10-CM | POA: Diagnosis not present

## 2018-06-01 DIAGNOSIS — L97512 Non-pressure chronic ulcer of other part of right foot with fat layer exposed: Secondary | ICD-10-CM | POA: Diagnosis not present

## 2018-06-01 DIAGNOSIS — I130 Hypertensive heart and chronic kidney disease with heart failure and stage 1 through stage 4 chronic kidney disease, or unspecified chronic kidney disease: Secondary | ICD-10-CM | POA: Diagnosis not present

## 2018-06-01 DIAGNOSIS — I5032 Chronic diastolic (congestive) heart failure: Secondary | ICD-10-CM | POA: Diagnosis not present

## 2018-06-01 DIAGNOSIS — E11621 Type 2 diabetes mellitus with foot ulcer: Secondary | ICD-10-CM | POA: Diagnosis not present

## 2018-06-02 DIAGNOSIS — E113591 Type 2 diabetes mellitus with proliferative diabetic retinopathy without macular edema, right eye: Secondary | ICD-10-CM | POA: Diagnosis not present

## 2018-06-02 DIAGNOSIS — E113512 Type 2 diabetes mellitus with proliferative diabetic retinopathy with macular edema, left eye: Secondary | ICD-10-CM | POA: Diagnosis not present

## 2018-06-04 DIAGNOSIS — N183 Chronic kidney disease, stage 3 (moderate): Secondary | ICD-10-CM | POA: Diagnosis not present

## 2018-06-04 DIAGNOSIS — E11621 Type 2 diabetes mellitus with foot ulcer: Secondary | ICD-10-CM | POA: Diagnosis not present

## 2018-06-04 DIAGNOSIS — E1122 Type 2 diabetes mellitus with diabetic chronic kidney disease: Secondary | ICD-10-CM | POA: Diagnosis not present

## 2018-06-04 DIAGNOSIS — I130 Hypertensive heart and chronic kidney disease with heart failure and stage 1 through stage 4 chronic kidney disease, or unspecified chronic kidney disease: Secondary | ICD-10-CM | POA: Diagnosis not present

## 2018-06-04 DIAGNOSIS — L97512 Non-pressure chronic ulcer of other part of right foot with fat layer exposed: Secondary | ICD-10-CM | POA: Diagnosis not present

## 2018-06-04 DIAGNOSIS — I5032 Chronic diastolic (congestive) heart failure: Secondary | ICD-10-CM | POA: Diagnosis not present

## 2018-06-07 ENCOUNTER — Encounter: Payer: Medicare HMO | Admitting: Podiatry

## 2018-06-07 DIAGNOSIS — E1143 Type 2 diabetes mellitus with diabetic autonomic (poly)neuropathy: Secondary | ICD-10-CM | POA: Diagnosis not present

## 2018-06-07 DIAGNOSIS — Z79899 Other long term (current) drug therapy: Secondary | ICD-10-CM | POA: Diagnosis not present

## 2018-06-07 DIAGNOSIS — K3184 Gastroparesis: Secondary | ICD-10-CM | POA: Diagnosis not present

## 2018-06-07 DIAGNOSIS — I10 Essential (primary) hypertension: Secondary | ICD-10-CM | POA: Diagnosis not present

## 2018-06-07 DIAGNOSIS — E119 Type 2 diabetes mellitus without complications: Secondary | ICD-10-CM | POA: Diagnosis not present

## 2018-06-07 DIAGNOSIS — R109 Unspecified abdominal pain: Secondary | ICD-10-CM | POA: Diagnosis not present

## 2018-06-07 DIAGNOSIS — M25511 Pain in right shoulder: Secondary | ICD-10-CM | POA: Diagnosis not present

## 2018-06-07 DIAGNOSIS — Z794 Long term (current) use of insulin: Secondary | ICD-10-CM | POA: Diagnosis not present

## 2018-06-07 DIAGNOSIS — W010XXA Fall on same level from slipping, tripping and stumbling without subsequent striking against object, initial encounter: Secondary | ICD-10-CM | POA: Diagnosis not present

## 2018-06-07 DIAGNOSIS — S42034A Nondisplaced fracture of lateral end of right clavicle, initial encounter for closed fracture: Secondary | ICD-10-CM | POA: Diagnosis not present

## 2018-06-08 DIAGNOSIS — N183 Chronic kidney disease, stage 3 (moderate): Secondary | ICD-10-CM | POA: Diagnosis not present

## 2018-06-08 DIAGNOSIS — R944 Abnormal results of kidney function studies: Secondary | ICD-10-CM | POA: Diagnosis not present

## 2018-06-08 DIAGNOSIS — E782 Mixed hyperlipidemia: Secondary | ICD-10-CM | POA: Diagnosis not present

## 2018-06-08 DIAGNOSIS — I130 Hypertensive heart and chronic kidney disease with heart failure and stage 1 through stage 4 chronic kidney disease, or unspecified chronic kidney disease: Secondary | ICD-10-CM | POA: Diagnosis not present

## 2018-06-08 DIAGNOSIS — K219 Gastro-esophageal reflux disease without esophagitis: Secondary | ICD-10-CM | POA: Diagnosis not present

## 2018-06-08 DIAGNOSIS — E785 Hyperlipidemia, unspecified: Secondary | ICD-10-CM | POA: Diagnosis not present

## 2018-06-08 DIAGNOSIS — I10 Essential (primary) hypertension: Secondary | ICD-10-CM | POA: Diagnosis not present

## 2018-06-08 DIAGNOSIS — N189 Chronic kidney disease, unspecified: Secondary | ICD-10-CM | POA: Diagnosis not present

## 2018-06-08 DIAGNOSIS — E1122 Type 2 diabetes mellitus with diabetic chronic kidney disease: Secondary | ICD-10-CM | POA: Diagnosis not present

## 2018-06-08 DIAGNOSIS — N182 Chronic kidney disease, stage 2 (mild): Secondary | ICD-10-CM | POA: Diagnosis not present

## 2018-06-08 DIAGNOSIS — Z6841 Body Mass Index (BMI) 40.0 and over, adult: Secondary | ICD-10-CM | POA: Diagnosis not present

## 2018-06-08 DIAGNOSIS — K3184 Gastroparesis: Secondary | ICD-10-CM | POA: Diagnosis not present

## 2018-06-08 DIAGNOSIS — I5032 Chronic diastolic (congestive) heart failure: Secondary | ICD-10-CM | POA: Diagnosis not present

## 2018-06-09 ENCOUNTER — Emergency Department: Payer: Medicare HMO

## 2018-06-09 ENCOUNTER — Other Ambulatory Visit: Payer: Self-pay

## 2018-06-09 ENCOUNTER — Encounter: Payer: Self-pay | Admitting: Emergency Medicine

## 2018-06-09 ENCOUNTER — Emergency Department
Admission: EM | Admit: 2018-06-09 | Discharge: 2018-06-09 | Discharge status: Against Medical Advice | Payer: Medicare HMO | Attending: Emergency Medicine | Admitting: Emergency Medicine

## 2018-06-09 DIAGNOSIS — R109 Unspecified abdominal pain: Secondary | ICD-10-CM | POA: Diagnosis not present

## 2018-06-09 DIAGNOSIS — K5900 Constipation, unspecified: Secondary | ICD-10-CM

## 2018-06-09 DIAGNOSIS — Z79899 Other long term (current) drug therapy: Secondary | ICD-10-CM | POA: Diagnosis not present

## 2018-06-09 DIAGNOSIS — I129 Hypertensive chronic kidney disease with stage 1 through stage 4 chronic kidney disease, or unspecified chronic kidney disease: Secondary | ICD-10-CM | POA: Insufficient documentation

## 2018-06-09 DIAGNOSIS — R1013 Epigastric pain: Secondary | ICD-10-CM | POA: Diagnosis not present

## 2018-06-09 DIAGNOSIS — N183 Chronic kidney disease, stage 3 (moderate): Secondary | ICD-10-CM | POA: Diagnosis not present

## 2018-06-09 DIAGNOSIS — Z794 Long term (current) use of insulin: Secondary | ICD-10-CM | POA: Insufficient documentation

## 2018-06-09 DIAGNOSIS — R0600 Dyspnea, unspecified: Secondary | ICD-10-CM

## 2018-06-09 DIAGNOSIS — R0602 Shortness of breath: Secondary | ICD-10-CM | POA: Diagnosis not present

## 2018-06-09 DIAGNOSIS — E1122 Type 2 diabetes mellitus with diabetic chronic kidney disease: Secondary | ICD-10-CM | POA: Diagnosis not present

## 2018-06-09 DIAGNOSIS — R601 Generalized edema: Secondary | ICD-10-CM | POA: Diagnosis not present

## 2018-06-09 LAB — CBC
HCT: 23.8 % — ABNORMAL LOW (ref 39.0–52.0)
Hemoglobin: 8 g/dL — ABNORMAL LOW (ref 13.0–17.0)
MCH: 31.3 pg (ref 26.0–34.0)
MCHC: 33.6 g/dL (ref 30.0–36.0)
MCV: 93 fL (ref 80.0–100.0)
Platelets: 148 10*3/uL — ABNORMAL LOW (ref 150–400)
RBC: 2.56 MIL/uL — ABNORMAL LOW (ref 4.22–5.81)
RDW: 12.8 % (ref 11.5–15.5)
WBC: 7.4 10*3/uL (ref 4.0–10.5)
nRBC: 0 % (ref 0.0–0.2)

## 2018-06-09 LAB — URINALYSIS, COMPLETE (UACMP) WITH MICROSCOPIC
Bacteria, UA: NONE SEEN
Bilirubin Urine: NEGATIVE
Glucose, UA: >=500 mg/dL — AB
Hgb urine dipstick: NEGATIVE
Ketones, ur: NEGATIVE mg/dL
Leukocytes, UA: NEGATIVE
Nitrite: NEGATIVE
Protein, ur: >=300 mg/dL — AB
Specific Gravity, Urine: 1.008 (ref 1.005–1.030)
pH: 7 (ref 5.0–8.0)

## 2018-06-09 LAB — BASIC METABOLIC PANEL
Anion gap: 11 (ref 5–15)
BUN: 23 mg/dL — ABNORMAL HIGH (ref 6–20)
CO2: 25 mmol/L (ref 22–32)
Calcium: 8.2 mg/dL — ABNORMAL LOW (ref 8.9–10.3)
Chloride: 103 mmol/L (ref 98–111)
Creatinine, Ser: 3.2 mg/dL — ABNORMAL HIGH (ref 0.61–1.24)
GFR calc Af Amer: 25 mL/min — ABNORMAL LOW (ref >60)
GFR calc non Af Amer: 21 mL/min — ABNORMAL LOW (ref >60)
Glucose, Bld: 206 mg/dL — ABNORMAL HIGH (ref 70–99)
Potassium: 3.3 mmol/L — ABNORMAL LOW (ref 3.5–5.1)
Sodium: 139 mmol/L (ref 135–145)

## 2018-06-09 LAB — BRAIN NATRIURETIC PEPTIDE: B Natriuretic Peptide: 1422 pg/mL — ABNORMAL HIGH (ref 0.0–100.0)

## 2018-06-09 LAB — TROPONIN I: Troponin I: 0.03 ng/mL (ref <0.03)

## 2018-06-09 LAB — GLUCOSE, CAPILLARY: Glucose-Capillary: 187 mg/dL — ABNORMAL HIGH (ref 70–99)

## 2018-06-09 MED ORDER — IOPAMIDOL (ISOVUE-300) INJECTION 61%
15.0000 mL | INTRAVENOUS | Status: AC
  Administered 2018-06-09: 15 mL via ORAL

## 2018-06-09 MED ORDER — PROCHLORPERAZINE EDISYLATE 10 MG/2ML IJ SOLN
5.0000 mg | Freq: Once | INTRAMUSCULAR | Status: AC
  Administered 2018-06-09: 5 mg via INTRAVENOUS
  Filled 2018-06-09: qty 2

## 2018-06-09 MED ORDER — MORPHINE SULFATE (PF) 4 MG/ML IV SOLN
4.0000 mg | Freq: Once | INTRAVENOUS | Status: AC
  Administered 2018-06-09: 4 mg via INTRAVENOUS
  Filled 2018-06-09: qty 1

## 2018-06-09 MED ORDER — LACTULOSE 10 G PO PACK: 10.0000 g | PACK | Freq: Every morning | ORAL | 0 refills | Status: DC

## 2018-06-09 NOTE — ED Notes (Signed)
Pt complains of epigastric abdominal pain. Pt states he has a hx of epigastric pain, has felt nauseous this morning with 4 episodes of emesis. Pt states he was at doctor this morning when he became dizzy and leaned over to the side of chair. Pt leaning to right at this time. Right arm in sling from previous injury. Pt states he broke his collar bone from a previous fall.

## 2018-06-09 NOTE — Discharge Instructions (Addendum)
I think it would be much safer for you if you stayed in the hospital.  Additionally we could expedite your evaluation and visits with the kidney doctor.  You have a lot of fluid built up in your system.  I want you to take the furosemide or Demadex 2 pills twice a day for now.  I want you to see your regular doctor tomorrow.  We will give you some lactulose.  Take it once a day.  That might help move the stool in your colon out and may help some of the pain that you are having.  Please feel free to return at any time especially for increasing pain shortness of breath fever or feeling sicker.  Also please return here if you cannot see your doctor tomorrow.

## 2018-06-09 NOTE — ED Triage Notes (Addendum)
PT from PCP via EMS for near syncopal fall with dizziness. PT was being seen at PCP for renal failure follow up. PT A&OX4, denies CP . cbg 261 per EMS . PT has chronic wound noted to RT foot and is being follow by wound nurse. Dx with gastroparesis, c/o abd pain

## 2018-06-09 NOTE — ED Notes (Signed)
Date and time results received: 06/09/18   Test: troponin Critical Value: 0.08  Name of Provider Notified: robinson   Orders Received? Or Actions Taken?: md notified

## 2018-06-09 NOTE — ED Notes (Signed)
Pt difficult stick, attempted x2 with no success. Lab called at this time and will come to draw blood

## 2018-06-09 NOTE — ED Provider Notes (Signed)
[facility] Emergency Department Provider Note   ____________________________________________   First MD Initiated Contact with Patient 06/09/18 1227     (approximate)  I have reviewed the triage vital signs and the nursing notes.   HISTORY  Chief Complaint Abdominal Pain and Weakness   HPI Timothy Clark is a 47 y.o. male patient comes in the emergency room is breathing hard says he is breathing hard because his belly hurts.  His belly hurts in the middle of his abdomen just below the umbilicus.  Is been going on like that for couple days.  In other words is worse than normal for the last couple days.  He is a severe diabetic who his best friend says is going, down the tubes.  He has gastroparesis she has been evaluated for he came from to Dr. as he was being checked for renal failure as his creatinine has been going up.  He says he has pain like this when he has gastroparesis.  He said he had a stool checked for blood last month and was negative he does not want me to do rectal exam now.  At the doctor patient became very lightheaded and then on the way here was getting very woozy almost passed out several times.   Past Medical History:  Diagnosis Date  . Chronic abdominal pain   . Esophagitis   . Eye hemorrhage   . Gastritis   . Gastroparesis   . GERD (gastroesophageal reflux disease)   . Glaucoma   . Helicobacter pylori (H. pylori) infection 2016   ERADICATION DOCUMENTED VIA STOOL TEST in AUG 2016 at [place]  . Hiccups   . Hypertension   . Nausea and vomiting    chronic, recurrent  . Type 2 diabetes mellitus with complications (HCC)    diagnosed around age 45    Patient Active Problem List   Diagnosis Date Noted  . Scrotal swelling 11/10/2017  . Swelling 11/09/2017  . Scrotal edema   . Obesity, Class III, BMI 40-49.9 (morbid obesity) ([place]) 11/02/2017  . CKD stage 3 due to type 2 diabetes mellitus ([place]) 10/30/2017  . Wound eschars of foot,  right  10/30/2017  . Chronic abdominal pain 09/21/2017  . Prolonged QT interval 09/21/2017  . GERD (gastroesophageal reflux disease) 07/14/2017  . Glaucoma 07/14/2017  . Anemia 07/14/2017  . Pressure injury of skin 02/01/2017  . Insulin dependent diabetes mellitus ([place]) 09/07/2016  . Orthostatic syncope 09/05/2016  . 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 Gastroparesis due to DM ([place]) 02/06/2015  . Essential hypertension 12/06/2014  . Diabetic macular edema ([place]) 02/18/2014  . PDR (proliferative diabetic retinopathy) ([place]) 02/06/2014  . Neovascular glaucoma of left eye 01/11/2014  . Diabetic infection of left heel ([place]) 02/14/2010  . UNSPECIFIED PERIPHERAL VASCULAR DISEASE 10/19/2009  . ERECTILE DYSFUNCTION, ORGANIC 10/19/2009  . NUMBNESS 07/20/2009  . Diabetes mellitus type 2 with complications, uncontrolled ([place]) 03/22/2009  . Hyperlipidemia 03/22/2009  . Depression 03/22/2009    Past Surgical History:  Procedure Laterality Date  . CATARACT EXTRACTION W/PHACO Left 05/19/2016   Procedure: CATARACT EXTRACTION PHACO AND INTRAOCULAR LENS PLACEMENT LEFT EYE;  Surgeon: Tonny Branch, MD;  Location: [facility];  Service: Ophthalmology;  Laterality: Left;  CDE: 7.30  . CATARACT EXTRACTION W/PHACO Right 06/23/2016   Procedure: CATARACT EXTRACTION PHACO AND INTRAOCULAR LENS PLACEMENT RIGHT EYE CDE=9.87;  Surgeon: Tonny Branch, MD;  Location: [facility];  Service: Ophthalmology;  Laterality: Right;  right  . ESOPHAGOGASTRODUODENOSCOPY  2015  Dr. Britta Mccreedy  . ESOPHAGOGASTRODUODENOSCOPY N/A 10/26/2014   RMR: Distal esophagititis-likely reflux related although an element of pill induced injuruy no exclueded  status post biopsy. Diffusely abnormal gastric mucosa of uncertain signigicane -status post gastric biopsy. Focal area of excoriation in the cardia most consistant with a trauma of heaving.   . ESOPHAGOGASTRODUODENOSCOPY  08/2015   [facility]: mild esophagitis and old gastric contents  . EYE SURGERY  2015   stent  placed to left eye  . EYE SURGERY    . INCISION AND DRAINAGE OF WOUND Right 07/16/2017   Procedure: IRRIGATION AND DEBRIDEMENT OF SOFT TISSUE OF ULCERATION RIGHT FOOT;  Surgeon: Caprice Beaver, DPM;  Location: [facility];  Service: Podiatry;  Laterality: Right;    Prior to Admission medications   Medication Sig Start Date End Date Taking? Authorizing Provider  atorvastatin (LIPITOR) 10 MG tablet Take 10 mg by mouth at bedtime.     [provider]  Blood Glucose Monitoring Suppl (PRODIGY AUTOCODE BLOOD GLUCOSE) w/Device KIT  04/16/18   [provider]  brimonidine (ALPHAGAN) 0.2 % ophthalmic solution Place 1 drop into both eyes 2 (two) times daily.    [provider]  cloNIDine (CATAPRES) 0.2 MG tablet Take 0.2 mg by mouth 2 (two) times daily.    [provider]  cyanocobalamin (,VITAMIN B-12,) 1000 MCG/ML injection INJECT 1 ML (CC) INTRAMUSCULARLY ONCE A WEEK FOR 4 WEEKS THEN MONTHLY 04/19/18   [provider]  dorzolamide (TRUSOPT) 2 % ophthalmic solution INSTILL 1 DROPS INTO EACH EYE TWICE DAILY 04/23/18   [provider]  hydrALAZINE (APRESOLINE) 50 MG tablet Take 1 tablet (50 mg total) by mouth 3 (three) times daily. 11/06/17   Danford, Suann Larry, MD  insulin aspart protamine- aspart (NOVOLOG MIX 70/30) (70-30) 100 UNIT/ML injection Inject 0.25 mLs (25 Units total) into the skin 2 (two) times daily with a meal. 09/24/17   Georgette Shell, MD  lisinopril (PRINIVIL,ZESTRIL) 5 MG tablet Take 5 mg by mouth daily.    [provider]  LORazepam (ATIVAN) 1 MG tablet Take 1 mg by mouth 2 (two) times daily as needed. for anxiety 05/07/18   [provider]  pantoprazole (PROTONIX) 40 MG tablet Take 40 mg by mouth 2 (two) times daily.    [provider]  potassium chloride SA (K-DUR,KLOR-CON) 20 MEQ tablet Take 1 tablet (20 mEq total) by mouth 2 (two) times daily. 11/12/17   Caren Griffins, MD  promethazine (PHENERGAN) 25  MG tablet Take 0.5 tablets (12.5 mg total) by mouth every 6 (six) hours as needed for nausea or vomiting. To one tablet every 6 hours 02/24/18   Annitta Needs, NP  sitaGLIPtin (JANUVIA) 50 MG tablet Take 50 mg by mouth daily.    [provider]  sucralfate (CARAFATE) 1 g tablet Take 1 g by mouth 4 (four) times daily.     [provider]  timolol (TIMOPTIC) 0.5 % ophthalmic solution Place 1 drop into both eyes 2 (two) times daily.    [provider]  torsemide (DEMADEX) 20 MG tablet TAKE 1 TABLET BY MOUTH TWICE DAILY AT 7 AM AND 1 PM 04/16/18   [provider]    Allergies Donnatal [pb-hyoscy-atropine-scopolamine]; Fish allergy; Haldol [haloperidol lactate]; Lidocaine; Maalox [calcium carbonate antacid]; Lisinopril; Marinol [dronabinol]; Aspirin; Bactrim [sulfamethoxazole-trimethoprim]; Doxycycline; Keflex [cephalexin]; and Penicillins  Family History  Problem Relation Age of Onset  . Ovarian cancer Mother   . Heart attack Father   . Cervical cancer  Sister   . Diabetes Sister   . Colon cancer Neg Hx     Social History Social History   Tobacco Use  . Smoking status: Never Smoker  . Smokeless tobacco: Never Used  Substance Use Topics  . Alcohol use: No    Alcohol/week: 0.0 standard drinks  . Drug use: No    Review of Systems  Constitutional: No fever/chills Eyes: No visual changes. ENT: No sore throat. Cardiovascular: Denies chest pain. Respiratory: Denies shortness of breath. Gastrointestinal:abdominal pain.  Some nausea,  vomiting.  No diarrhea.  No constipation. Genitourinary: Negative for dysuria. Musculoskeletal: Negative for back pain. Skin: Negative for rash. Neurological: Negative for headaches, focal weakness    ____________________________________________   PHYSICAL EXAM:  VITAL SIGNS: ED Triage Vitals  Enc Vitals Group     BP 06/09/18 1129 (!) 196/89     Pulse Rate 06/09/18 1129 86     Resp 06/09/18 1129 16     Temp  06/09/18 1129 98.9 F (37.2 C)     Temp Source 06/09/18 1129 Oral     SpO2 06/09/18 1129 100 %     Weight 06/09/18 1127 (!) 370 lb (167.8 kg)     Height 06/09/18 1127 6' (1.829 m)     Head Circumference --      Peak Flow --      Pain Score 06/09/18 1126 10     Pain Loc --      Pain Edu? --      Excl. in [place]? --     Constitutional: Alert and oriented.  Breathing hard looks uncomfortable Eyes: Conjunctivae are normal.  Patient is blind in the left eye chronically and has a disconjugate gaze with the left eye turned out Head: Atraumatic. Nose: No congestion/rhinnorhea. Mouth/Throat: Mucous membranes are moist.  Oropharynx non-erythematous. Neck: No stridor.   Cardiovascular: Normal rate, regular rhythm. Grossly normal heart sounds.  Good peripheral circulation. Respiratory: Increased respiratory effort.  No retractions. Lungs CTAB. Gastrointestinal: Soft tender in the mid abdomen just above the umbilicus. No distention. No abdominal bruits. No CVA tenderness. Musculoskeletal: No lower extremity tenderness nor edema.  .Neurologic:  Normal speech and language. No gross focal neurologic deficits are appreciated.  Skin:  Skin is warm, dry and intact. No rash noted.  ____________________________________________   LABS (all labs ordered are listed, but only abnormal results are displayed)  Labs Reviewed  BASIC METABOLIC PANEL - Abnormal; Notable for the following components:      Result Value   Potassium 3.3 (*)    Glucose, Bld 206 (*)    BUN 23 (*)    Creatinine, Ser 3.20 (*)    Calcium 8.2 (*)    GFR calc non Af Amer 21 (*)    GFR calc Af Amer 25 (*)    All other components within normal limits  CBC - Abnormal; Notable for the following components:   RBC 2.56 (*)    Hemoglobin 8.0 (*)    HCT 23.8 (*)    Platelets 148 (*)    All other components within normal limits  URINALYSIS, COMPLETE (UACMP) WITH MICROSCOPIC - Abnormal; Notable for the following components:   Color, Urine  YELLOW (*)    APPearance CLEAR (*)    Glucose, UA >=500 (*)    Protein, ur >=300 (*)    All other components within normal limits  GLUCOSE, CAPILLARY - Abnormal; Notable for the following components:   Glucose-Capillary 187 (*)    All other components within normal  limits  TROPONIN I - Abnormal; Notable for the following components:   Troponin I 0.03 (*)    All other components within normal limits  BRAIN NATRIURETIC PEPTIDE - Abnormal; Notable for the following components:   B Natriuretic Peptide 1,422.0 (*)    All other components within normal limits  CBG MONITORING, ED   ____________________________________________  EKG  EKG read and interpreted by me shows normal sinus rhythm rate of 80 left axis nonspecific ST-T wave changes ____________________________________________  RADIOLOGY  ED MD interpretation: Chest x-ray read by radiology reviewed by me shows some segmental atelectasis or early pneumonia in the right middle lobe area infrahilar early in an old clavicle fracture  Official radiology report(s): Ct Abdomen Pelvis Wo Contrast  Addendum Date: 06/09/2018   ADDENDUM REPORT: 06/09/2018 16:16 ADDENDUM: In the body of the report under at a biliary, it should state: Hepatic steatosis of the liver without space-occupying mass given limitations of a noncontrast study. Electronically Signed   By: Ashley Royalty M.D.   On: 06/09/2018 16:16   Result Date: 06/09/2018 CLINICAL DATA:  Epigastric abdominal pain EXAM: CT ABDOMEN AND PELVIS WITHOUT CONTRAST TECHNIQUE: Multidetector CT imaging of the abdomen and pelvis was performed following the standard protocol without IV contrast. COMPARISON:  05/04/2018 FINDINGS: Lower chest: Top-normal size heart with trace pericardial effusion. No acute pulmonary abnormality. Bibasilar atelectasis or scarring in the lung bases. Hepatobiliary: Caren Griffins ptosis of the liver without definite space-occupying mass given limitations of a noncontrast study.  Gallbladder is unremarkable. Pancreas: Normal Spleen: Normal Adrenals/Urinary Tract: Normal bilateral adrenal glands. Partially duplicated renal collecting systems bilaterally without effective uropathy. Physiologic distention of the urinary bladder without focal mural thickening or calculus. Stomach/Bowel: Decompressed stomach. Normal duodenal sweep and ligament of Treitz. Contrast is seen within nonobstructed channel loops. Normal appendix with contrast seen within from prior study. Moderate stool retention cecum to descending colon. No large bowel obstruction or inflammatory change. Vascular/Lymphatic: Nonaneurysmal abdominal aorta. Atherosclerosis of the internal iliac and femoral arteries. No lymphadenopathy. Reproductive: Normal size prostate. Seminal vesicles are unremarkable. Other: Soft tissue anasarca.  No ascites or free air. Musculoskeletal: Degenerative disc disease of the lower lumbar spine from L3 through L5. No acute nor aggressive osseous lesions per IMPRESSION: 1. Soft tissue anasarca as before. 2. Steatosis of the liver. 3. No acute bowel obstruction or inflammation. Moderate stool retention within the colon from cecum through descending colon. Electronically Signed: By: Ashley Royalty M.D. On: 06/09/2018 15:47   Dg Chest Portable 1 View  Result Date: 06/09/2018 CLINICAL DATA:  Near syncopal episode associated with dizziness. History of renal failure, hypertension, and diabetes. The patient is complaining of shortness of breath. EXAM: PORTABLE CHEST 1 VIEW COMPARISON:  Portable chest x-ray of May 13, 2018 FINDINGS: The lungs are adequately inflated. The interstitial markings are mildly prominent but are accentuated by the overlying soft tissues. There may be subsegmental atelectasis in the right infrahilar region. The heart is top normal in size. The pulmonary vascularity is not engorged. The trachea is midline. There is no pleural effusion. The observed bony thorax exhibits no acute  abnormality. There is a known fracture of the distal aspect of the right clavicle. IMPRESSION: No pulmonary edema. Subsegmental atelectasis or possible early pneumonia in the right infrahilar region. Followup PA and lateral chest X-ray is recommended in 3-4 weeks following trial of antibiotic therapy to ensure resolution and exclude underlying malignancy. Known distal right clavicular fracture and AC joint separation. Electronically Signed   By: David  [place]  M.D.   On: 06/09/2018 13:45    ____________________________________________   PROCEDURES  Procedure(s) performed:   Procedures  Critical Care performed:   ____________________________________________   INITIAL IMPRESSION / ASSESSMENT AND PLAN / ED COURSE  Patient with anasarca and a lot of stool in the colon.  No other apparent etiology for his abdominal pain.  He is tachypneic in pain his renal function is deteriorating rapidly.  He is seeing his primary care doctor and has not referral to renal to get evaluated but I believe he is more than sick enough to be admitted here but he will not stay.  I told him he is dying and it is possible that we can halt the process if we can put him in the hospital and we can certainly get him to see the kidney doctor much more rapidly if we put him in the hospital but he will not stay I am letting his friend who is with him try to talk him into staying for me but if this does not work I will let him go and tell him to see his doctor tomorrow.  He is taking furosemide.  I will have him double the dose.         ____________________________________________   FINAL CLINICAL IMPRESSION(S) / ED DIAGNOSES  Final diagnoses:  Abdominal pain, unspecified abdominal location  Constipation, unspecified constipation type  Anasarca  Dyspnea, unspecified type     ED Discharge Orders    None       Note:  This document was prepared using Dragon voice recognition software and may include  unintentional dictation errors.    Nena Polio, MD 06/09/18 [phone]

## 2018-06-09 NOTE — ED Notes (Signed)
Patient transported to CT 

## 2018-06-09 NOTE — ED Notes (Signed)
Pt refuses to stay per EDP suggestion, pt explained risks of leaving AMA and expresses verbal understanding at this time.  Pt advised to follow-up closely with PCP and Nephrology, also advised to to return to ED for any worsening condition.

## 2018-06-09 NOTE — ED Notes (Signed)
Pt legally blind in RT eye

## 2018-06-10 DIAGNOSIS — R109 Unspecified abdominal pain: Secondary | ICD-10-CM | POA: Diagnosis not present

## 2018-06-10 DIAGNOSIS — I10 Essential (primary) hypertension: Secondary | ICD-10-CM | POA: Diagnosis not present

## 2018-06-10 DIAGNOSIS — E119 Type 2 diabetes mellitus without complications: Secondary | ICD-10-CM | POA: Diagnosis not present

## 2018-06-10 DIAGNOSIS — Z880 Allergy status to penicillin: Secondary | ICD-10-CM | POA: Diagnosis not present

## 2018-06-10 DIAGNOSIS — K5900 Constipation, unspecified: Secondary | ICD-10-CM | POA: Diagnosis not present

## 2018-06-10 DIAGNOSIS — Z79899 Other long term (current) drug therapy: Secondary | ICD-10-CM | POA: Diagnosis not present

## 2018-06-11 DIAGNOSIS — F209 Schizophrenia, unspecified: Secondary | ICD-10-CM | POA: Diagnosis not present

## 2018-06-11 DIAGNOSIS — E1122 Type 2 diabetes mellitus with diabetic chronic kidney disease: Secondary | ICD-10-CM | POA: Diagnosis not present

## 2018-06-11 DIAGNOSIS — N189 Chronic kidney disease, unspecified: Secondary | ICD-10-CM | POA: Diagnosis not present

## 2018-06-11 DIAGNOSIS — R451 Restlessness and agitation: Secondary | ICD-10-CM | POA: Diagnosis not present

## 2018-06-11 DIAGNOSIS — R0902 Hypoxemia: Secondary | ICD-10-CM | POA: Diagnosis not present

## 2018-06-11 DIAGNOSIS — R456 Violent behavior: Secondary | ICD-10-CM | POA: Diagnosis not present

## 2018-06-11 DIAGNOSIS — J9811 Atelectasis: Secondary | ICD-10-CM | POA: Diagnosis not present

## 2018-06-11 DIAGNOSIS — R404 Transient alteration of awareness: Secondary | ICD-10-CM | POA: Diagnosis not present

## 2018-06-11 DIAGNOSIS — I10 Essential (primary) hypertension: Secondary | ICD-10-CM | POA: Diagnosis not present

## 2018-06-11 DIAGNOSIS — R4020 Unspecified coma: Secondary | ICD-10-CM | POA: Diagnosis not present

## 2018-06-11 DIAGNOSIS — Z79899 Other long term (current) drug therapy: Secondary | ICD-10-CM | POA: Diagnosis not present

## 2018-06-11 DIAGNOSIS — K5900 Constipation, unspecified: Secondary | ICD-10-CM | POA: Diagnosis not present

## 2018-06-11 DIAGNOSIS — Z794 Long term (current) use of insulin: Secondary | ICD-10-CM | POA: Diagnosis not present

## 2018-06-11 DIAGNOSIS — I509 Heart failure, unspecified: Secondary | ICD-10-CM | POA: Diagnosis not present

## 2018-06-11 DIAGNOSIS — I130 Hypertensive heart and chronic kidney disease with heart failure and stage 1 through stage 4 chronic kidney disease, or unspecified chronic kidney disease: Secondary | ICD-10-CM | POA: Diagnosis not present

## 2018-06-11 NOTE — Progress Notes (Signed)
This encounter was created in error - please disregard.

## 2018-06-13 ENCOUNTER — Inpatient Hospital Stay: Payer: Medicare HMO

## 2018-06-13 ENCOUNTER — Other Ambulatory Visit: Payer: Self-pay

## 2018-06-13 ENCOUNTER — Emergency Department: Payer: Medicare HMO

## 2018-06-13 ENCOUNTER — Encounter: Payer: Self-pay | Admitting: *Deleted

## 2018-06-13 ENCOUNTER — Inpatient Hospital Stay
Admission: EM | Admit: 2018-06-13 | Discharge: 2018-06-16 | DRG: 304 | Disposition: A | Discharge status: Home Health | Payer: Medicare HMO | Attending: Specialist | Admitting: Specialist

## 2018-06-13 DIAGNOSIS — E876 Hypokalemia: Secondary | ICD-10-CM | POA: Diagnosis not present

## 2018-06-13 DIAGNOSIS — E785 Hyperlipidemia, unspecified: Secondary | ICD-10-CM | POA: Diagnosis present

## 2018-06-13 DIAGNOSIS — R4182 Altered mental status, unspecified: Secondary | ICD-10-CM

## 2018-06-13 DIAGNOSIS — E1142 Type 2 diabetes mellitus with diabetic polyneuropathy: Secondary | ICD-10-CM | POA: Diagnosis present

## 2018-06-13 DIAGNOSIS — Z880 Allergy status to penicillin: Secondary | ICD-10-CM | POA: Diagnosis not present

## 2018-06-13 DIAGNOSIS — N183 Chronic kidney disease, stage 3 (moderate): Secondary | ICD-10-CM | POA: Diagnosis present

## 2018-06-13 DIAGNOSIS — E1143 Type 2 diabetes mellitus with diabetic autonomic (poly)neuropathy: Secondary | ICD-10-CM | POA: Diagnosis not present

## 2018-06-13 DIAGNOSIS — R609 Edema, unspecified: Secondary | ICD-10-CM

## 2018-06-13 DIAGNOSIS — I5032 Chronic diastolic (congestive) heart failure: Secondary | ICD-10-CM | POA: Diagnosis present

## 2018-06-13 DIAGNOSIS — H409 Unspecified glaucoma: Secondary | ICD-10-CM | POA: Diagnosis present

## 2018-06-13 DIAGNOSIS — I10 Essential (primary) hypertension: Secondary | ICD-10-CM | POA: Diagnosis not present

## 2018-06-13 DIAGNOSIS — I161 Hypertensive emergency: Principal | ICD-10-CM | POA: Diagnosis present

## 2018-06-13 DIAGNOSIS — H548 Legal blindness, as defined in USA: Secondary | ICD-10-CM | POA: Diagnosis present

## 2018-06-13 DIAGNOSIS — S42031A Displaced fracture of lateral end of right clavicle, initial encounter for closed fracture: Secondary | ICD-10-CM

## 2018-06-13 DIAGNOSIS — R809 Proteinuria, unspecified: Secondary | ICD-10-CM | POA: Diagnosis not present

## 2018-06-13 DIAGNOSIS — I160 Hypertensive urgency: Secondary | ICD-10-CM

## 2018-06-13 DIAGNOSIS — K828 Other specified diseases of gallbladder: Secondary | ICD-10-CM | POA: Diagnosis not present

## 2018-06-13 DIAGNOSIS — I248 Other forms of acute ischemic heart disease: Secondary | ICD-10-CM | POA: Diagnosis present

## 2018-06-13 DIAGNOSIS — G9341 Metabolic encephalopathy: Secondary | ICD-10-CM | POA: Diagnosis not present

## 2018-06-13 DIAGNOSIS — Z885 Allergy status to narcotic agent status: Secondary | ICD-10-CM

## 2018-06-13 DIAGNOSIS — E1122 Type 2 diabetes mellitus with diabetic chronic kidney disease: Secondary | ICD-10-CM | POA: Diagnosis not present

## 2018-06-13 DIAGNOSIS — K3184 Gastroparesis: Secondary | ICD-10-CM | POA: Diagnosis not present

## 2018-06-13 DIAGNOSIS — S42001D Fracture of unspecified part of right clavicle, subsequent encounter for fracture with routine healing: Secondary | ICD-10-CM

## 2018-06-13 DIAGNOSIS — R600 Localized edema: Secondary | ICD-10-CM | POA: Diagnosis not present

## 2018-06-13 DIAGNOSIS — I130 Hypertensive heart and chronic kidney disease with heart failure and stage 1 through stage 4 chronic kidney disease, or unspecified chronic kidney disease: Secondary | ICD-10-CM | POA: Diagnosis present

## 2018-06-13 DIAGNOSIS — R4020 Unspecified coma: Secondary | ICD-10-CM | POA: Diagnosis not present

## 2018-06-13 DIAGNOSIS — R079 Chest pain, unspecified: Secondary | ICD-10-CM | POA: Diagnosis not present

## 2018-06-13 DIAGNOSIS — N179 Acute kidney failure, unspecified: Secondary | ICD-10-CM | POA: Diagnosis not present

## 2018-06-13 DIAGNOSIS — K219 Gastro-esophageal reflux disease without esophagitis: Secondary | ICD-10-CM | POA: Diagnosis not present

## 2018-06-13 DIAGNOSIS — Z888 Allergy status to other drugs, medicaments and biological substances status: Secondary | ICD-10-CM | POA: Diagnosis not present

## 2018-06-13 DIAGNOSIS — Z79899 Other long term (current) drug therapy: Secondary | ICD-10-CM | POA: Diagnosis not present

## 2018-06-13 DIAGNOSIS — Z886 Allergy status to analgesic agent status: Secondary | ICD-10-CM | POA: Diagnosis not present

## 2018-06-13 DIAGNOSIS — Z794 Long term (current) use of insulin: Secondary | ICD-10-CM

## 2018-06-13 DIAGNOSIS — Z881 Allergy status to other antibiotic agents status: Secondary | ICD-10-CM | POA: Diagnosis not present

## 2018-06-13 DIAGNOSIS — E860 Dehydration: Secondary | ICD-10-CM | POA: Diagnosis present

## 2018-06-13 DIAGNOSIS — I129 Hypertensive chronic kidney disease with stage 1 through stage 4 chronic kidney disease, or unspecified chronic kidney disease: Secondary | ICD-10-CM | POA: Diagnosis not present

## 2018-06-13 DIAGNOSIS — Z6841 Body Mass Index (BMI) 40.0 and over, adult: Secondary | ICD-10-CM

## 2018-06-13 DIAGNOSIS — W19XXXD Unspecified fall, subsequent encounter: Secondary | ICD-10-CM | POA: Diagnosis present

## 2018-06-13 DIAGNOSIS — R29818 Other symptoms and signs involving the nervous system: Secondary | ICD-10-CM | POA: Diagnosis not present

## 2018-06-13 DIAGNOSIS — D631 Anemia in chronic kidney disease: Secondary | ICD-10-CM | POA: Diagnosis present

## 2018-06-13 DIAGNOSIS — K760 Fatty (change of) liver, not elsewhere classified: Secondary | ICD-10-CM | POA: Diagnosis not present

## 2018-06-13 DIAGNOSIS — R29810 Facial weakness: Secondary | ICD-10-CM | POA: Diagnosis not present

## 2018-06-13 DIAGNOSIS — K5900 Constipation, unspecified: Secondary | ICD-10-CM | POA: Diagnosis present

## 2018-06-13 LAB — URINALYSIS, COMPLETE (UACMP) WITH MICROSCOPIC
Bilirubin Urine: NEGATIVE
Glucose, UA: 150 mg/dL — AB
Ketones, ur: 5 mg/dL — AB
Leukocytes, UA: NEGATIVE
Nitrite: NEGATIVE
Protein, ur: >=300 mg/dL — AB
Specific Gravity, Urine: 1.019 (ref 1.005–1.030)
pH: 6 (ref 5.0–8.0)

## 2018-06-13 LAB — URINE DRUG SCREEN, QUALITATIVE (ARMC ONLY)
Amphetamines, Ur Screen: NOT DETECTED
Barbiturates, Ur Screen: NOT DETECTED
Benzodiazepine, Ur Scrn: POSITIVE — AB
Cannabinoid 50 Ng, Ur ~~LOC~~: NOT DETECTED
Cocaine Metabolite,Ur ~~LOC~~: NOT DETECTED
MDMA (Ecstasy)Ur Screen: NOT DETECTED
Methadone Scn, Ur: NOT DETECTED
Opiate, Ur Screen: NOT DETECTED
Phencyclidine (PCP) Ur S: NOT DETECTED
Tricyclic, Ur Screen: NOT DETECTED

## 2018-06-13 LAB — COMPREHENSIVE METABOLIC PANEL
ALT: 15 U/L (ref 0–44)
AST: 38 U/L (ref 15–41)
Albumin: 2.6 g/dL — ABNORMAL LOW (ref 3.5–5.0)
Alkaline Phosphatase: 56 U/L (ref 38–126)
Anion gap: 11 (ref 5–15)
BUN: 27 mg/dL — ABNORMAL HIGH (ref 6–20)
CO2: 26 mmol/L (ref 22–32)
Calcium: 8.2 mg/dL — ABNORMAL LOW (ref 8.9–10.3)
Chloride: 103 mmol/L (ref 98–111)
Creatinine, Ser: 3.21 mg/dL — ABNORMAL HIGH (ref 0.61–1.24)
GFR calc Af Amer: 25 mL/min — ABNORMAL LOW (ref >60)
GFR calc non Af Amer: 21 mL/min — ABNORMAL LOW (ref >60)
Glucose, Bld: 127 mg/dL — ABNORMAL HIGH (ref 70–99)
Potassium: 3.2 mmol/L — ABNORMAL LOW (ref 3.5–5.1)
Sodium: 140 mmol/L (ref 135–145)
Total Bilirubin: 1 mg/dL (ref 0.3–1.2)
Total Protein: 6 g/dL — ABNORMAL LOW (ref 6.5–8.1)

## 2018-06-13 LAB — BLOOD GAS, ARTERIAL
Acid-Base Excess: 2.3 mmol/L — ABNORMAL HIGH (ref 0.0–2.0)
Bicarbonate: 28.2 mmol/L — ABNORMAL HIGH (ref 20.0–28.0)
FIO2: 0.28
O2 Saturation: 95.7 %
Patient temperature: 37
pCO2 arterial: 50 mmHg — ABNORMAL HIGH (ref 32.0–48.0)
pH, Arterial: 7.36 (ref 7.350–7.450)
pO2, Arterial: 83 mmHg (ref 83.0–108.0)

## 2018-06-13 LAB — TROPONIN I
Troponin I: 0.1 ng/mL (ref <0.03)
Troponin I: 0.07 ng/mL (ref <0.03)
Troponin I: 0.08 ng/mL (ref <0.03)

## 2018-06-13 LAB — SAMPLE TO BLOOD BANK

## 2018-06-13 LAB — CBC
HCT: 25.7 % — ABNORMAL LOW (ref 39.0–52.0)
Hemoglobin: 8.7 g/dL — ABNORMAL LOW (ref 13.0–17.0)
MCH: 31.6 pg (ref 26.0–34.0)
MCHC: 33.9 g/dL (ref 30.0–36.0)
MCV: 93.5 fL (ref 80.0–100.0)
Platelets: 195 10*3/uL (ref 150–400)
RBC: 2.75 MIL/uL — ABNORMAL LOW (ref 4.22–5.81)
RDW: 13 % (ref 11.5–15.5)
WBC: 10.5 10*3/uL (ref 4.0–10.5)
nRBC: 0 % (ref 0.0–0.2)

## 2018-06-13 LAB — CREATININE, SERUM
Creatinine, Ser: 3.13 mg/dL — ABNORMAL HIGH (ref 0.61–1.24)
GFR calc Af Amer: 25 mL/min — ABNORMAL LOW (ref >60)
GFR calc non Af Amer: 22 mL/min — ABNORMAL LOW (ref >60)

## 2018-06-13 LAB — DIFFERENTIAL
Abs Immature Granulocytes: 0.05 10*3/uL (ref 0.00–0.07)
Basophils Absolute: 0 10*3/uL (ref 0.0–0.1)
Basophils Relative: 0 %
Eosinophils Absolute: 0.2 10*3/uL (ref 0.0–0.5)
Eosinophils Relative: 2 %
Immature Granulocytes: 1 %
Lymphocytes Relative: 12 %
Lymphs Abs: 1.2 10*3/uL (ref 0.7–4.0)
Monocytes Absolute: 0.9 10*3/uL (ref 0.1–1.0)
Monocytes Relative: 8 %
Neutro Abs: 8.1 10*3/uL — ABNORMAL HIGH (ref 1.7–7.7)
Neutrophils Relative %: 77 %

## 2018-06-13 LAB — HEMOGLOBIN A1C
Hgb A1c MFr Bld: 6.4 % — ABNORMAL HIGH (ref 4.8–5.6)
Mean Plasma Glucose: 136.98 mg/dL

## 2018-06-13 LAB — GLUCOSE, CAPILLARY: Glucose-Capillary: 124 mg/dL — ABNORMAL HIGH (ref 70–99)

## 2018-06-13 LAB — PROTIME-INR
INR: 1.05
Prothrombin Time: 13.6 seconds (ref 11.4–15.2)

## 2018-06-13 LAB — APTT: aPTT: 32 seconds (ref 24–36)

## 2018-06-13 MED ORDER — BISACODYL 5 MG PO TBEC: 5.0000 mg | DELAYED_RELEASE_TABLET | Freq: Every day | ORAL | Status: DC | PRN

## 2018-06-13 MED ORDER — LABETALOL HCL 5 MG/ML IV SOLN
20.0000 mg | Freq: Once | INTRAVENOUS | Status: AC
  Administered 2018-06-13: 20 mg via INTRAVENOUS
  Filled 2018-06-13: qty 4

## 2018-06-13 MED ORDER — TIMOLOL MALEATE 0.5 % OP SOLN
1.0000 [drp] | Freq: Two times a day (BID) | OPHTHALMIC | Status: DC
  Administered 2018-06-14 – 2018-06-16 (×5): 1 [drp] via OPHTHALMIC
  Filled 2018-06-13: qty 5

## 2018-06-13 MED ORDER — ACETAMINOPHEN 650 MG RE SUPP: 650.0000 mg | Freq: Four times a day (QID) | RECTAL | Status: DC | PRN

## 2018-06-13 MED ORDER — HALOPERIDOL LACTATE 5 MG/ML IJ SOLN
INTRAMUSCULAR | Status: AC
  Filled 2018-06-13: qty 1

## 2018-06-13 MED ORDER — LABETALOL HCL 5 MG/ML IV SOLN: 20.0000 mg | Freq: Once | INTRAVENOUS | Status: DC

## 2018-06-13 MED ORDER — HYDRALAZINE HCL 20 MG/ML IJ SOLN: 10.0000 mg | Freq: Four times a day (QID) | INTRAMUSCULAR | Status: DC | PRN

## 2018-06-13 MED ORDER — POTASSIUM CHLORIDE 20 MEQ/15ML (10%) PO SOLN
40.0000 meq | Freq: Once | ORAL | Status: AC
  Administered 2018-06-14: 40 meq via ORAL
  Filled 2018-06-13: qty 30

## 2018-06-13 MED ORDER — DORZOLAMIDE HCL 2 % OP SOLN
1.0000 [drp] | Freq: Two times a day (BID) | OPHTHALMIC | Status: DC
  Administered 2018-06-14 – 2018-06-16 (×5): 1 [drp] via OPHTHALMIC
  Filled 2018-06-13: qty 10

## 2018-06-13 MED ORDER — CARVEDILOL 25 MG PO TABS
25.0000 mg | ORAL_TABLET | Freq: Two times a day (BID) | ORAL | Status: DC
  Administered 2018-06-14 – 2018-06-16 (×5): 25 mg via ORAL
  Filled 2018-06-13 (×5): qty 1

## 2018-06-13 MED ORDER — HALOPERIDOL LACTATE 5 MG/ML IJ SOLN
2.0000 mg | Freq: Once | INTRAMUSCULAR | Status: AC
  Administered 2018-06-13: 2 mg via INTRAVENOUS

## 2018-06-13 MED ORDER — LABETALOL HCL 5 MG/ML IV SOLN: 10.0000 mg | Freq: Four times a day (QID) | INTRAVENOUS | Status: DC | PRN

## 2018-06-13 MED ORDER — INSULIN ASPART 100 UNIT/ML ~~LOC~~ SOLN
0.0000 [IU] | Freq: Every day | SUBCUTANEOUS | Status: DC
  Filled 2018-06-13: qty 1

## 2018-06-13 MED ORDER — MORPHINE SULFATE (PF) 4 MG/ML IV SOLN: 4.0000 mg | INTRAVENOUS | Status: DC | PRN

## 2018-06-13 MED ORDER — BRIMONIDINE TARTRATE 0.2 % OP SOLN
1.0000 [drp] | Freq: Three times a day (TID) | OPHTHALMIC | Status: DC
  Administered 2018-06-14 – 2018-06-16 (×8): 1 [drp] via OPHTHALMIC
  Filled 2018-06-13: qty 5

## 2018-06-13 MED ORDER — CLONIDINE HCL 0.2 MG/24HR TD PTWK
0.2000 mg | MEDICATED_PATCH | TRANSDERMAL | Status: DC
  Administered 2018-06-14: 0.2 mg via TRANSDERMAL
  Filled 2018-06-13: qty 1

## 2018-06-13 MED ORDER — ATORVASTATIN CALCIUM 20 MG PO TABS
40.0000 mg | ORAL_TABLET | Freq: Every day | ORAL | Status: DC
  Administered 2018-06-14 – 2018-06-15 (×2): 40 mg via ORAL
  Filled 2018-06-13 (×2): qty 2

## 2018-06-13 MED ORDER — HYDRALAZINE HCL 50 MG PO TABS
25.0000 mg | ORAL_TABLET | Freq: Once | ORAL | Status: AC
  Administered 2018-06-13: 25 mg via ORAL
  Filled 2018-06-13: qty 1

## 2018-06-13 MED ORDER — HYDRALAZINE HCL 20 MG/ML IJ SOLN
10.0000 mg | Freq: Once | INTRAMUSCULAR | Status: AC
  Administered 2018-06-13: 10 mg via INTRAVENOUS
  Filled 2018-06-13: qty 1

## 2018-06-13 MED ORDER — NICARDIPINE HCL IN NACL 20-0.86 MG/200ML-% IV SOLN
INTRAVENOUS | Status: AC
  Filled 2018-06-13: qty 200

## 2018-06-13 MED ORDER — LORAZEPAM 2 MG/ML IJ SOLN
1.0000 mg | Freq: Once | INTRAMUSCULAR | Status: AC
  Administered 2018-06-13: 1 mg via INTRAVENOUS
  Filled 2018-06-13: qty 1

## 2018-06-13 MED ORDER — ACETAMINOPHEN 325 MG PO TABS: 650.0000 mg | ORAL_TABLET | Freq: Four times a day (QID) | ORAL | Status: DC | PRN

## 2018-06-13 MED ORDER — FOLIC ACID 1 MG PO TABS
1.0000 mg | ORAL_TABLET | Freq: Every day | ORAL | Status: DC
  Administered 2018-06-14 – 2018-06-16 (×3): 1 mg via ORAL
  Filled 2018-06-13 (×3): qty 1

## 2018-06-13 MED ORDER — HYDROCODONE-ACETAMINOPHEN 5-325 MG PO TABS
1.0000 | ORAL_TABLET | ORAL | Status: DC | PRN
  Administered 2018-06-14 – 2018-06-15 (×2): 2 via ORAL
  Filled 2018-06-13 (×2): qty 2

## 2018-06-13 MED ORDER — SUCRALFATE 1 G PO TABS
1.0000 g | ORAL_TABLET | Freq: Four times a day (QID) | ORAL | Status: DC
  Administered 2018-06-14 – 2018-06-16 (×10): 1 g via ORAL
  Filled 2018-06-13 (×11): qty 1

## 2018-06-13 MED ORDER — INSULIN ASPART PROT & ASPART (70-30 MIX) 100 UNIT/ML ~~LOC~~ SUSP
25.0000 [IU] | Freq: Two times a day (BID) | SUBCUTANEOUS | Status: DC
  Administered 2018-06-14 – 2018-06-16 (×5): 25 [IU] via SUBCUTANEOUS
  Filled 2018-06-13 (×5): qty 10

## 2018-06-13 MED ORDER — HYDRALAZINE HCL 50 MG PO TABS
50.0000 mg | ORAL_TABLET | Freq: Three times a day (TID) | ORAL | Status: DC
  Administered 2018-06-14: 50 mg via ORAL
  Filled 2018-06-13: qty 1

## 2018-06-13 MED ORDER — NITROGLYCERIN IN D5W 200-5 MCG/ML-% IV SOLN
0.0000 ug/min | Freq: Once | INTRAVENOUS | Status: AC
  Administered 2018-06-13: 5 ug/min via INTRAVENOUS

## 2018-06-13 MED ORDER — LACTULOSE 10 GM/15ML PO SOLN
10.0000 g | Freq: Every morning | ORAL | Status: DC
  Administered 2018-06-14 – 2018-06-15 (×2): 10 g via ORAL
  Filled 2018-06-13 (×3): qty 30

## 2018-06-13 MED ORDER — INSULIN ASPART 100 UNIT/ML ~~LOC~~ SOLN
0.0000 [IU] | Freq: Three times a day (TID) | SUBCUTANEOUS | Status: DC
  Administered 2018-06-14: 2 [IU] via SUBCUTANEOUS
  Administered 2018-06-14 – 2018-06-15 (×2): 1 [IU] via SUBCUTANEOUS
  Administered 2018-06-15: 2 [IU] via SUBCUTANEOUS
  Administered 2018-06-15 – 2018-06-16 (×3): 1 [IU] via SUBCUTANEOUS
  Filled 2018-06-13 (×7): qty 1

## 2018-06-13 MED ORDER — HEPARIN SODIUM (PORCINE) 5000 UNIT/ML IJ SOLN
5000.0000 [IU] | Freq: Three times a day (TID) | INTRAMUSCULAR | Status: DC
  Administered 2018-06-14 – 2018-06-16 (×7): 5000 [IU] via SUBCUTANEOUS
  Filled 2018-06-13 (×7): qty 1

## 2018-06-13 MED ORDER — SENNOSIDES-DOCUSATE SODIUM 8.6-50 MG PO TABS: 1.0000 | ORAL_TABLET | Freq: Every evening | ORAL | Status: DC | PRN

## 2018-06-13 MED ORDER — CLOPIDOGREL BISULFATE 75 MG PO TABS
75.0000 mg | ORAL_TABLET | Freq: Every day | ORAL | Status: DC
  Administered 2018-06-14 – 2018-06-16 (×3): 75 mg via ORAL
  Filled 2018-06-13 (×3): qty 1

## 2018-06-13 MED ORDER — LORAZEPAM 1 MG PO TABS: 1.0000 mg | ORAL_TABLET | Freq: Two times a day (BID) | ORAL | Status: DC | PRN

## 2018-06-13 MED ORDER — SODIUM CHLORIDE 0.9 % IV SOLN
INTRAVENOUS | Status: DC
  Administered 2018-06-13: 21:00:00 via INTRAVENOUS

## 2018-06-13 MED ORDER — NITROGLYCERIN IN D5W 200-5 MCG/ML-% IV SOLN
INTRAVENOUS | Status: AC
  Filled 2018-06-13: qty 250

## 2018-06-13 MED ORDER — LORAZEPAM 2 MG/ML IJ SOLN
INTRAMUSCULAR | Status: AC
  Filled 2018-06-13: qty 1

## 2018-06-13 NOTE — ED Notes (Signed)
BP taken on Left forearm

## 2018-06-13 NOTE — ED Notes (Signed)
When attempting to transport pt pt started attempting to climb out of the end of bed as we were traveling down the hallway. MD made aware of pt status and verbal order for 2mg  haldol placed per Dr. Stark Klein

## 2018-06-13 NOTE — ED Notes (Signed)
Attempted to complete teleneuro but neurology states not a candidate for tpa. Pt became non responsive during examination and unable to complete exam. ED MD notified and called to bedside.

## 2018-06-13 NOTE — ED Triage Notes (Signed)
Pt to ED after family reported pt "collapsed" at church complaining of chest pain. Pt is pale upon arrival and while being removed form his car [employer] reports pt went limp and unresponsive. Pt in triage room with RN at this time and able to answer questions but not appropriately. Pt is confused and unaware of location, name or time. Pt unable to follow commands and almost feel from wheelchair multiple times in triage room. Pt has left sided facial droop that family confirms is new. Family reports the collapse at church happened around 11:30 which was the last known well time.   Pt also is reported to have gone unresponsive multiple times in the car while family drove him to the ED.   While in CT pt has a 30 second episode of seizure like activity with full body shaking  and unresponsiveness. Pt placed on 2L oxygen until he responded to a sternal rub by this RN.

## 2018-06-13 NOTE — Progress Notes (Signed)
CODE STROKE- PHARMACY COMMUNICATION   Time CODE STROKE called/page received:12:13  Time response to CODE STROKE was made (in person or via phone): phone  Time Stroke Kit retrieved from [place] (only if needed): NA  Name of Provider/Nurse contacted:Lori  Past Medical History:  Diagnosis Date  . Chronic abdominal pain   . Esophagitis   . Eye hemorrhage   . Gastritis   . Gastroparesis   . GERD (gastroesophageal reflux disease)   . Glaucoma   . Helicobacter pylori (H. pylori) infection 2016   ERADICATION DOCUMENTED VIA STOOL TEST in AUG 2016 at [place]  . Hiccups   . Hypertension   . Nausea and vomiting    chronic, recurrent  . Type 2 diabetes mellitus with complications (HCC)    diagnosed around age 42   Prior to Admission medications   Medication Sig Start Date End Date Taking? Authorizing Provider  atorvastatin (LIPITOR) 10 MG tablet Take 10 mg by mouth at bedtime.     [provider]  Blood Glucose Monitoring Suppl (PRODIGY AUTOCODE BLOOD GLUCOSE) w/Device KIT  04/16/18   [provider]  brimonidine (ALPHAGAN) 0.2 % ophthalmic solution Place 1 drop into both eyes 2 (two) times daily.    [provider]  cloNIDine (CATAPRES) 0.2 MG tablet Take 0.2 mg by mouth 2 (two) times daily.    [provider]  cyanocobalamin (,VITAMIN B-12,) 1000 MCG/ML injection INJECT 1 ML (CC) INTRAMUSCULARLY ONCE A WEEK FOR 4 WEEKS THEN MONTHLY 04/19/18   [provider]  dorzolamide (TRUSOPT) 2 % ophthalmic solution INSTILL 1 DROPS INTO EACH EYE TWICE DAILY 04/23/18   [provider]  hydrALAZINE (APRESOLINE) 50 MG tablet Take 1 tablet (50 mg total) by mouth 3 (three) times daily. 11/06/17   Danford, Suann Larry, MD  insulin aspart protamine- aspart (NOVOLOG MIX 70/30) (70-30) 100 UNIT/ML injection Inject 0.25 mLs (25 Units total) into the skin 2 (two) times daily with a meal. 09/24/17   Georgette Shell, MD  lactulose (CEPHULAC) 10 g packet Take 1  packet (10 g total) by mouth every morning. 06/09/18   Nena Polio, MD  lisinopril (PRINIVIL,ZESTRIL) 5 MG tablet Take 5 mg by mouth daily.    [provider]  LORazepam (ATIVAN) 1 MG tablet Take 1 mg by mouth 2 (two) times daily as needed. for anxiety 05/07/18   [provider]  pantoprazole (PROTONIX) 40 MG tablet Take 40 mg by mouth 2 (two) times daily.    [provider]  potassium chloride SA (K-DUR,KLOR-CON) 20 MEQ tablet Take 1 tablet (20 mEq total) by mouth 2 (two) times daily. 11/12/17   Caren Griffins, MD  promethazine (PHENERGAN) 25 MG tablet Take 0.5 tablets (12.5 mg total) by mouth every 6 (six) hours as needed for nausea or vomiting. To one tablet every 6 hours 02/24/18   Annitta Needs, NP  sitaGLIPtin (JANUVIA) 50 MG tablet Take 50 mg by mouth daily.    [provider]  sucralfate (CARAFATE) 1 g tablet Take 1 g by mouth 4 (four) times daily.     [provider]  timolol (TIMOPTIC) 0.5 % ophthalmic solution Place 1 drop into both eyes 2 (two) times daily.    [provider]  torsemide (DEMADEX) 20 MG tablet TAKE 1 TABLET BY MOUTH TWICE DAILY AT 7 AM AND 1 PM 04/16/18   [provider]    Laural Benes ,PharmD, BCPS Clinical Pharmacist  06/13/2018  12:22 PM

## 2018-06-13 NOTE — ED Notes (Signed)
Pt appears to be resting at this time.

## 2018-06-13 NOTE — ED Notes (Signed)
Roughly 324mL urine output at time of catheter insertion.

## 2018-06-13 NOTE — ED Notes (Addendum)
Unable to obtain blood at this time. MD at bedside to attempt ultrasound iv Pt bc alert for a brief period stating his abdomen was killing him.

## 2018-06-13 NOTE — ED Notes (Signed)
Pt brother in law states pt lives with a sister and father, when pt is at home brother in law states "its like when that pain hits he in another world, he will get aggressive and try to get up and just take off and get into the highway, Its like he just wants pain to end" "his father just cant take care of him anymore". Brother in law states stomach pain is from diabetes, and pt has been dealing with stomach pain for years. Pt sister states he has not had a bowel movement in days, and when he was throwing up "he was spitting out brown stuff" like it was feces.

## 2018-06-13 NOTE — ED Notes (Signed)
Pt able to respond with yes when asked questions, states his collar bone was broken Monday. Pt is currently communicating with staff verbally. States first name, repeating doug. Unable to state last name at this time. Unable to clearly state full birthday can state month and year. States age correctly.

## 2018-06-13 NOTE — ED Notes (Signed)
Tele neuro at bedside. Waiting for consult.

## 2018-06-13 NOTE — ED Notes (Signed)
Nitro drip rate changed per MD request

## 2018-06-13 NOTE — ED Notes (Signed)
Pt attempting to climb out of bed at this time. Pt redirected by nurse and had to be held back in bed to prevent pt from falling out of bed.

## 2018-06-13 NOTE — ED Notes (Signed)
Called 333 initiated code stroke  1213

## 2018-06-13 NOTE — ED Notes (Signed)
Pt had large BM on bedpan. Pt cleaned and repositioned in bed. Foley cath emptied and pt urinary output included 575mL .

## 2018-06-13 NOTE — Progress Notes (Signed)
Chaplain responded to an OR for an AD. Pt is not in a condition to fill out an AD.    06/13/18 2100  Clinical Encounter Type  Visited With Patient  Visit Type Follow-up  Referral From Nurse  Spiritual Encounters  Spiritual Needs Brochure

## 2018-06-13 NOTE — ED Notes (Signed)
Temp foley placed at this time per MD request.

## 2018-06-13 NOTE — ED Notes (Signed)
Pt non responsive to pain. Pt all of a sudden sits forward and arms and legs begin shaking as if having a seizure. Then pt goes back non responsive at this time.

## 2018-06-13 NOTE — ED Notes (Signed)
Pt currently sleeping. Pt is snoring and having apneic episodes.

## 2018-06-13 NOTE — ED Notes (Addendum)
Pt non responsive and will suddenly sit forward in pain and states he is hurting. When pain passes pt becomes non responsive again. Pt is very hard to keep in bed. Multiple staff members needed to keep pt in bed when pt is having intense pain

## 2018-06-13 NOTE — ED Provider Notes (Addendum)
[facility] Emergency Department Provider Note  ____________________________________________   First MD Initiated Contact with Patient 06/13/18 1221     (approximate)  I have reviewed the triage vital signs and the nursing notes.   HISTORY  Chief Complaint Code Stroke   HPI Timothy Clark is a 47 y.o. male with chronic abdominal pain with a history of gastritis, gastroparesis and GERD who is presenting to the emergency department today with worsening altered mental status over the past 4 weeks.  He was brought in initially today for chest pain as well as left-sided facial droop and left-sided weakness that started at approximately 11-11 30 per family.  Patient denying reports upper abdominal pain that is radiating into his chest.  Unable to say quality or intensity.  Also with recent fall and clavicular fracture to the right.  Repeat fall today as well.  Unclear of his head or not.  Patient brought in without immobilizer or sling to the right upper extremity.  Patient is in and out of consciousness while I am interviewing him.  He occasionally sits up in bed, pulling himself over to the left side.  Nurse, Alfonse Spruce, had him several days ago as well and says that this behavior is consistent with what she was seeing several days ago.  Sister also corroborates this behavior is ongoing over the past month.  States that the patient will be awake and alert and responsive to questioning and then will become unresponsive to verbal stimuli but will be pulling himself up in bed and acting agitated.  Patient also complaining of intermittent pain to the right shoulder where he is known to have a clavicular fracture.   Past Medical History:  Diagnosis Date  . Chronic abdominal pain   . Esophagitis   . Eye hemorrhage   . Gastritis   . Gastroparesis   . GERD (gastroesophageal reflux disease)   . Glaucoma   . Helicobacter pylori (H. pylori) infection 2016   ERADICATION  DOCUMENTED VIA STOOL TEST in AUG 2016 at [place]  . Hiccups   . Hypertension   . Nausea and vomiting    chronic, recurrent  . Type 2 diabetes mellitus with complications (HCC)    diagnosed around age 89    Patient Active Problem List   Diagnosis Date Noted  . Scrotal swelling 11/10/2017  . Swelling 11/09/2017  . Scrotal edema   . Obesity, Class III, BMI 40-49.9 (morbid obesity) ([place]) 11/02/2017  . CKD stage 3 due to type 2 diabetes mellitus ([place]) 10/30/2017  . Wound eschars of foot, right  10/30/2017  . Chronic abdominal pain 09/21/2017  . Prolonged QT interval 09/21/2017  . GERD (gastroesophageal reflux disease) 07/14/2017  . Glaucoma 07/14/2017  . Anemia 07/14/2017  . Pressure injury of skin 02/01/2017  . Insulin dependent diabetes mellitus ([place]) 09/07/2016  . Orthostatic syncope 09/05/2016  . 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 Gastroparesis due to DM ([place]) 02/06/2015  . Essential hypertension 12/06/2014  . Diabetic macular edema ([place]) 02/18/2014  . PDR (proliferative diabetic retinopathy) ([place]) 02/06/2014  . Neovascular glaucoma of left eye 01/11/2014  . Diabetic infection of left heel ([place]) 02/14/2010  . UNSPECIFIED PERIPHERAL VASCULAR DISEASE 10/19/2009  . ERECTILE DYSFUNCTION, ORGANIC 10/19/2009  . NUMBNESS 07/20/2009  . Diabetes mellitus type 2 with complications, uncontrolled ([place]) 03/22/2009  . Hyperlipidemia 03/22/2009  . Depression 03/22/2009    Past Surgical History:  Procedure Laterality Date  . CATARACT EXTRACTION W/PHACO Left 05/19/2016   Procedure: CATARACT EXTRACTION  PHACO AND INTRAOCULAR LENS PLACEMENT LEFT EYE;  Surgeon: Tonny Branch, MD;  Location: [facility];  Service: Ophthalmology;  Laterality: Left;  CDE: 7.30  . CATARACT EXTRACTION W/PHACO Right 06/23/2016   Procedure: CATARACT EXTRACTION PHACO AND INTRAOCULAR LENS PLACEMENT RIGHT EYE CDE=9.87;  Surgeon: Tonny Branch, MD;  Location: [facility];  Service: Ophthalmology;  Laterality: Right;  right  .  ESOPHAGOGASTRODUODENOSCOPY  2015   Dr. Britta Mccreedy  . ESOPHAGOGASTRODUODENOSCOPY N/A 10/26/2014   RMR: Distal esophagititis-likely reflux related although an element of pill induced injuruy no exclueded  status post biopsy. Diffusely abnormal gastric mucosa of uncertain signigicane -status post gastric biopsy. Focal area of excoriation in the cardia most consistant with a trauma of heaving.   . ESOPHAGOGASTRODUODENOSCOPY  08/2015   [facility]: mild esophagitis and old gastric contents  . EYE SURGERY  2015   stent placed to left eye  . EYE SURGERY    . INCISION AND DRAINAGE OF WOUND Right 07/16/2017   Procedure: IRRIGATION AND DEBRIDEMENT OF SOFT TISSUE OF ULCERATION RIGHT FOOT;  Surgeon: Caprice Beaver, DPM;  Location: [facility];  Service: Podiatry;  Laterality: Right;    Prior to Admission medications   Medication Sig Start Date End Date Taking? Authorizing Provider  atorvastatin (LIPITOR) 10 MG tablet Take 10 mg by mouth at bedtime.    Yes [provider]  brimonidine (ALPHAGAN) 0.2 % ophthalmic solution Place 1 drop into both eyes 3 (three) times daily.    Yes [provider]  carvedilol (COREG) 25 MG tablet Take 25 mg by mouth 2 (two) times daily with a meal.   Yes [provider]  cloNIDine (CATAPRES) 0.2 MG tablet Take 0.2 mg by mouth 2 (two) times daily.   Yes [provider]  dorzolamide (TRUSOPT) 2 % ophthalmic solution INSTILL 1 DROPS INTO EACH EYE TWICE DAILY 04/23/18  Yes [provider]  folic acid (FOLVITE) 1 MG tablet Take 1 mg by mouth daily.   Yes [provider]  hydrALAZINE (APRESOLINE) 50 MG tablet Take 1 tablet (50 mg total) by mouth 3 (three) times daily. 11/06/17  Yes Danford, Suann Larry, MD  insulin aspart protamine- aspart (NOVOLOG MIX 70/30) (70-30) 100 UNIT/ML injection Inject 0.25 mLs (25 Units total) into the skin 2 (two) times daily with a meal. 09/24/17  Yes Georgette Shell, MD  lisinopril (PRINIVIL,ZESTRIL) 5 MG  tablet Take 5 mg by mouth daily.   Yes [provider]  pantoprazole (PROTONIX) 40 MG tablet Take 40 mg by mouth 2 (two) times daily.   Yes [provider]  potassium chloride SA (K-DUR,KLOR-CON) 20 MEQ tablet Take 1 tablet (20 mEq total) by mouth 2 (two) times daily. 11/12/17  Yes Gherghe, Vella Redhead, MD  promethazine (PHENERGAN) 25 MG tablet Take 0.5 tablets (12.5 mg total) by mouth every 6 (six) hours as needed for nausea or vomiting. To one tablet every 6 hours 02/24/18  Yes Annitta Needs, NP  sitaGLIPtin (JANUVIA) 100 MG tablet Take 100 mg by mouth daily.   Yes [provider]  sucralfate (CARAFATE) 1 g tablet Take 1 g by mouth 4 (four) times daily.    Yes [provider]  timolol (TIMOPTIC) 0.5 % ophthalmic solution Place 1 drop into both eyes 2 (two) times daily.   Yes [provider]  Blood Glucose Monitoring Suppl (PRODIGY AUTOCODE BLOOD GLUCOSE) w/Device KIT  04/16/18   [provider]  cyanocobalamin (,VITAMIN B-12,) 1000 MCG/ML injection INJECT 1 ML (CC) INTRAMUSCULARLY ONCE A WEEK  FOR 4 WEEKS THEN MONTHLY 04/19/18   [provider]  lactulose (CEPHULAC) 10 g packet Take 1 packet (10 g total) by mouth every morning. 06/09/18   Nena Polio, MD  LORazepam (ATIVAN) 1 MG tablet Take 1 mg by mouth 2 (two) times daily as needed. for anxiety 05/07/18   [provider]  sitaGLIPtin (JANUVIA) 50 MG tablet Take 50 mg by mouth daily.    [provider]  torsemide (DEMADEX) 20 MG tablet Take 1 tablet in the mornings and 2 tablets in the evenings 04/16/18   [provider]    Allergies Donnatal [pb-hyoscy-atropine-scopolamine]; Fish allergy; Haldol [haloperidol lactate]; Lidocaine; Maalox [calcium carbonate antacid]; Lisinopril; Marinol [dronabinol]; Aspirin; Bactrim [sulfamethoxazole-trimethoprim]; Doxycycline; Keflex [cephalexin]; and Penicillins  Family History  Problem Relation Age of Onset  . Ovarian cancer  Mother   . Heart attack Father   . Cervical cancer Sister   . Diabetes Sister   . Colon cancer Neg Hx     Social History Social History   Tobacco Use  . Smoking status: Never Smoker  . Smokeless tobacco: Never Used  Substance Use Topics  . Alcohol use: No    Alcohol/week: 0.0 standard drinks  . Drug use: No    Review of Systems  Constitutional: No fever/chills Eyes: No visual changes. ENT: No sore throat. Cardiovascular: As above Respiratory: Denies shortness of breath. Gastrointestinal:  No nausea, no vomiting.  No diarrhea.  No constipation. Genitourinary: Negative for dysuria. Musculoskeletal: Negative for back pain. Skin: Negative for rash. Neurological: Negative for headaches, focal weakness or numbness.   ____________________________________________   PHYSICAL EXAM:  VITAL SIGNS: ED Triage Vitals  Enc Vitals Group     BP 06/13/18 1234 (!) 197/95     Pulse Rate 06/13/18 1233 83     Resp 06/13/18 1233 11     Temp 06/13/18 1310 99.1 F (37.3 C)     Temp Source 06/13/18 1310 Oral     SpO2 06/13/18 1233 100 %     Weight 06/13/18 1234 (!) 370 lb (167.8 kg)     Height --      Head Circumference --      Peak Flow --      Pain Score --      Pain Loc --      Pain Edu? --      Excl. in [place]? --     Constitutional: Alert and oriented to self but is intermittently confused.  However, is reactive to sternal rub and answer simple questions.  Able to state that his abdomen hurt as well as his right shoulder. Eyes: Conjunctivae are normal.  Head: Atraumatic. Nose: No congestion/rhinnorhea. Mouth/Throat: Mucous membranes are moist.  Neck: No stridor.  No tenderness to palpation.  No deformity or step-off. Cardiovascular: Normal rate, regular rhythm. Grossly normal heart sounds.  Good peripheral circulation with equal and bilateral radial as well as dorsalis pedis pulses. Respiratory: Normal respiratory effort.  No retractions. Lungs CTAB. Gastrointestinal: Soft and  nontender. No distention. No CVA tenderness. Musculoskeletal: Mild bilateral lower extremity edema.  Tenderness palpation over the right lateral clavicle. Neurologic:  Normal speech and language. No gross focal neurologic deficits are appreciated. Skin:  Skin is warm, dry and intact. No rash noted.   ____________________________________________   LABS (all labs ordered are listed, but only abnormal results are displayed)  Labs Reviewed  GLUCOSE, CAPILLARY - Abnormal; Notable for the following components:      Result Value   Glucose-Capillary 124 (*)  All other components within normal limits  CBC - Abnormal; Notable for the following components:   RBC 2.75 (*)    Hemoglobin 8.7 (*)    HCT 25.7 (*)    All other components within normal limits  DIFFERENTIAL - Abnormal; Notable for the following components:   Neutro Abs 8.1 (*)    All other components within normal limits  COMPREHENSIVE METABOLIC PANEL - Abnormal; Notable for the following components:   Potassium 3.2 (*)    Glucose, Bld 127 (*)    BUN 27 (*)    Creatinine, Ser 3.21 (*)    Calcium 8.2 (*)    Total Protein 6.0 (*)    Albumin 2.6 (*)    GFR calc non Af Amer 21 (*)    GFR calc Af Amer 25 (*)    All other components within normal limits  TROPONIN I - Abnormal; Notable for the following components:   Troponin I 0.10 (*)    All other components within normal limits  URINALYSIS, COMPLETE (UACMP) WITH MICROSCOPIC - Abnormal; Notable for the following components:   Color, Urine YELLOW (*)    APPearance HAZY (*)    Glucose, UA 150 (*)    Hgb urine dipstick SMALL (*)    Ketones, ur 5 (*)    Protein, ur >=300 (*)    All other components within normal limits  BLOOD GAS, ARTERIAL - Abnormal; Notable for the following components:   pCO2 arterial 50 (*)    Bicarbonate 28.2 (*)    Acid-Base Excess 2.3 (*)    All other components within normal limits  PROTIME-INR  APTT  CBG MONITORING, ED  SAMPLE TO BLOOD BANK    ____________________________________________  EKG  ED ECG REPORT I, Doran Stabler, the attending physician, personally viewed and interpreted this ECG.   Date: 06/13/2018  EKG Time: 1230  Rate: 87  Rhythm: normal sinus rhythm with PVC x1.  Axis: normal  Intervals:Prolonged QTc at 509  ST&T Change: No ST segment elevation or depression.  T wave inversions in V2 through V4.  ____________________________________________  RADIOLOGY  No acute finding on the head CT.  Comminuted and impacted right distal clavicular fracture.  Pending CT of the abdomen at this time.  Chest x-ray without acute pathology.  CT abdomen with a small pericardial effusion.  Gallbladder sludge.  Borderline thickening of the appendix, likely incidental as there are no secondary signs of appendicitis. ____________________________________________   PROCEDURES  Procedure(s) performed:   .Critical Care Performed by: Orbie Pyo, MD Authorized by: Orbie Pyo, MD   Critical care provider statement:    Critical care time (minutes):  35   Critical care time was exclusive of:  Separately billable procedures and treating other patients   Critical care was necessary to treat or prevent imminent or life-threatening deterioration of the following conditions:  CNS failure or compromise   Critical care was time spent personally by me on the following activities:  Development of treatment plan with patient or surrogate, discussions with consultants, evaluation of patient's response to treatment, examination of patient, obtaining history from patient or surrogate, ordering and performing treatments and interventions, ordering and review of laboratory studies, ordering and review of radiographic studies, pulse oximetry, re-evaluation of patient's condition and review of old charts  Angiocath insertion Performed by: Doran Stabler  Consent: Verbal consent obtained. Risks and benefits:  risks, benefits and alternatives were discussed Time out: Immediately prior to procedure a "time out" was called to verify  the correct patient, procedure, equipment, support staff and site/side marked as required.  Preparation: Patient was prepped and draped in the usual sterile fashion.  Vein Location: left basilic  Ultrasound Guided  Gauge: 20  Normal blood return and flush without difficulty Patient tolerance: Patient tolerated the procedure well with no immediate complications.     Critical Care performed:   ____________________________________________   INITIAL IMPRESSION / ASSESSMENT AND PLAN / ED COURSE  Pertinent labs & imaging results that were available during my care of the patient were reviewed by me and considered in my medical decision making (see chart for details).  Differential diagnosis includes, but is not limited to, alcohol, illicit or prescription medications, or other toxic ingestion; intracranial pathology such as stroke or intracerebral hemorrhage; fever or infectious causes including sepsis; hypoxemia and/or hypercarbia; uremia; trauma; endocrine related disorders such as diabetes, hypoglycemia, and thyroid-related diseases; hypertensive encephalopathy; etc. As part of my medical decision making, I reviewed the following data within the electronic MEDICAL RECORD NUMBER Notes from prior ED visits  ----------------------------------------- 3:02 PM on 06/13/2018 -----------------------------------------  Patient requiring Ativan for sedation.  Given 1 mg IV.  On his way to radiology when he became acutely agitated again and almost slid himself out of the bed.  Given 2 mg of IV Haldol at that time.  Despite allergy listed the patient is tolerating the Haldol without issue at this time and is calm and cooperative currently.  Very elevated blood pressure as well.  Given 20 mg of IV labetalol.  Plan on admission to the hospital for altered mental status with  hypertension.  Sling ordered for comminuted fracture of the distal right clavicle.  Pending read of the abdominal CAT scan.  However, likely related to chronic abdominal pain.  Patient has been evaluated by neurology and deemed to be altered for at least 4 days and is therefore not a TPA candidate.  ----------------------------------------- 3:56 PM on 06/13/2018 -----------------------------------------  Patient initially given labetalol.  Blood pressure reduced to 379 systolic over 02I diastolic.  Had discussed admission with Dr. Darvin Neighbours who was unable to admit the patient to the ICU because of no ICU beds.  We were initially working on getting the blood pressure down with bolus medications.  Switch to drip and will now titrate to systolic between 097 and 353.  Called to [facility] for ICU admission but they do not have any available ICU beds.  [facility] also on divert for ICU.  [facility] also contacted and we are now waiting for callback from [facility] for ICU transfer.  Patient wearing splint at this time.  No further episodes of agitation.  Signed out to Dr. Mariea Clonts.  CT the abdomen also with thickening of the appendix but without any other findings to indicate acute appendicitis.  Patient also did not have pain to the lower abdomen.  Normal white blood cell count.  Family aware of likely need for transfer. ____________________________________________   FINAL CLINICAL IMPRESSION(S) / ED DIAGNOSES  Hypertensive urgency.  Altered mental status.  Clavicular fracture.   NEW MEDICATIONS STARTED DURING THIS VISIT:  New Prescriptions   No medications on file     Note:  This document was prepared using Dragon voice recognition software and may include unintentional dictation errors.     Orbie Pyo, MD 06/13/18 1559    Orbie Pyo, MD 06/13/18 [place], Randall An, MD 06/13/18 1606    Torrey Horseman, Randall An, MD 06/13/18 [phone]

## 2018-06-13 NOTE — ED Notes (Signed)
Pt sit straight up and attempting to forcefully climb out of the end of bed at this time. This rn and ed tech attempted to redirect pt. Pt acts as if we are not present. Pt placed in slight trendelenburg at this time in attempt to keep pt from getting out of bed and harming self

## 2018-06-13 NOTE — ED Notes (Signed)
Date and time results received: 06/13/18  Test: troponin Critical Value: 0.10  Name of Provider Notified: [place] MS  Orders Received? Or Actions Taken?: MD notified

## 2018-06-13 NOTE — ED Notes (Signed)
Pt complaining with stomach pain. Stating we need to give him something for his stomach, pt states we need to give him some dilaudid.

## 2018-06-13 NOTE — ED Notes (Signed)
Spoke to admitting physician regarding pt admittion and bed placement. State pt is appropriate for placement on 2A. Admitting physician also states approval for safety sitter with the patient

## 2018-06-13 NOTE — ED Notes (Signed)
Spoke to susan in 2a, attempted to give full report. Manuela Schwartz states she will speak to house supervisor to discuss apropriatness of pt on floor

## 2018-06-13 NOTE — ED Notes (Addendum)
Pt transported to MRI with ED tech and RN

## 2018-06-13 NOTE — ED Notes (Signed)
Nitro drip stopped per md verbal order.

## 2018-06-13 NOTE — Progress Notes (Signed)
Chaplain responded to a code stroke while in ED for code. Patient ws non verbal. Chaplain escorted family to waiting area. Chaplain offered support and prayers. Family had a bad experience at another hospital this week.    06/13/18 1235  Clinical Encounter Type  Visited With Patient and family together  Visit Type Code  Referral From Nurse  Spiritual Encounters  Spiritual Needs Prayer;Emotional  Advance Directives (For Healthcare)  Does Patient Have a Medical Advance Directive? No  Would patient like information on creating a medical advance directive? No - Patient declined  [place]  Does Patient Have a Mental Health Advance Directive? No  Would patient like information on creating a mental health advance directive? No - Patient declined

## 2018-06-13 NOTE — ED Notes (Signed)
Pt alert at this time, states head is about to explode, "Im going to die", when speaking pt repeating words over and over.

## 2018-06-13 NOTE — Consult Note (Signed)
[facility] Tele[facility]rology Consult Services   Date of Service:   06/13/2018 12:28:44  Impression:     .  RO Acute Ischemic Stroke  Comments: AMS - encephalopathy vs seizure; doubt stroke but can't entirely rule out but outside of the window for any acute intervention. R clavicular fracture limiting R arm evaluation.  Mechanism of Stroke: Possible Thromboembolic Possible Cardioembolic Small Vessel Disease Watershed Infarct Not Clear  Metrics: Last Known Well: 06/09/2018 00:00:00 [facility] Notification Time: 06/13/2018 12:27:46 Arrival Time: 06/13/2018 12:00:00 Stamp Time: 06/13/2018 42:59:56 Time First Login Attempt: 06/13/2018 12:38:06 Video Start Time: 06/13/2018 12:38:06  Symptoms: unresponsiveness NIHSS Start Assessment Time: 06/13/2018 12:38:00 Patient is not a candidate for tPA. Patient was not deemed candidate for tPA thrombolytics because of lkw 4days ago, also possible active bleeding and head trauma this am. Video End Time: 06/13/2018 12:56:00  CT head showed no acute hemorrhage or acute core infarct.  Radiologist was not called back for review of advanced imaging because reported already ER Physician notified of the decision on thrombolytics management on 06/13/2018 12:55:31  Our recommendations are outlined below.  Recommendations:     .  Activate Stroke Protocol Admission/Order Set     .  Stroke/Telemetry Floor     .  Neuro Checks     .  Bedside Swallow Eval     .  DVT Prophylaxis     .  IV Fluids, Normal Saline     .  Head of Bed Below 30 Degrees     .  Euglycemia and Avoid Hyperthermia (PRN Acetaminophen)     .  Hold Antithrombotics for Now     .  Possible active bleeding as per wife.  Routine Consultation with [facility] for Follow up Care  Sign Out:     .  Discussed with Emergency Department Provider    ------------------------------------------------------------------------------  History of Present  Illness: Patient is a 47 year old Male.  Patient was brought by private transportation with symptoms of unresponsiveness  47yo M w pmh of dm, htn, glaucoma last normal 4 days ago. He was recentlyin the ED on 10/30th w abd pain and syncope and was diagnosed w kidney failure and gastroparesis. Had been having generalized weakness and this am had fall and became unresponsiveness. No hx of blood thinners. As per wife, spitting out "brown stuff" over the last couple of days.  CT head showed no acute hemorrhage or acute core infarct.  Last seen normal was beyond 4.5 hours of presentation. There is no history of hemorrhagic complications or intracranial hemorrhage. There is no history of Recent Anticoagulants. There is no history of recent major surgery.  Examination: 1A: Level of Consciousness - Postures or Unresponsive + 3 1B: Ask Month and Age - 1 Question Right + 1 1C: Blink Eyes & Squeeze Hands - Performs Both Tasks + 0 2: Test Horizontal Extraocular Movements - Forced Gaze Palsy: Cannot Be Overcome + 2 3: Test Visual Fields - Bilateral Hemianopia + 3 4: Test Facial Palsy (Use Grimace if Obtunded) - Normal symmetry + 0 5A: Test Left Arm Motor Drift - No Drift for 10 Seconds + 0 5B: Test Right Arm Motor Drift - Drift, hits bed + 2 6A: Test Left Leg Motor Drift - No Movement + 4 6B: Test Right Leg Motor Drift - Drift, hits bed + 2 7: Test Limb Ataxia (FNF/Heel-Shin) - No Ataxia + 0 8: Test Sensation - Coma/Unresponsive + 2 9: Test Language/Aphasia - Normal; No aphasia + 0 10:  Test Dysarthria - Mild-Moderate Dysarthria: Slurring but can be understood + 1 11: Test Extinction/Inattention - No abnormality + 0  NIHSS Score: 20  Patient was informed the Neurology Consult would happen via TeleHealth consult by way of interactive audio and video telecommunications and consented to receiving care in this manner.  Due to the immediate potential for life-threatening deterioration due to  underlying acute neurologic illness, I spent 35 minutes providing critical care. This time includes time for face to face visit via telemedicine, review of medical records, imaging studies and discussion of findings with providers, the patient and/or family.   Dr Barron Schmid   [facility] [phone]

## 2018-06-13 NOTE — ED Notes (Signed)
Called [facility] for transfer, no ICU beds, called [facility] for transfer they are on divert.  [place]

## 2018-06-13 NOTE — ED Notes (Signed)
Pt has arms crossed like he is guarding/clutching chest.

## 2018-06-13 NOTE — Progress Notes (Signed)
Advanced Care Plan.  Purpose of Encounter: CODE STATUS. Parties in Attendance: The patient, his 2 sisters and stepson, me Patient's Decisional Capacity: No. Medical Story: Timothy Clark  is a 47 y.o. male with a known history of chronic abdominal pain, esophagitis, legally blind, gastroparesis and gastritis, GERD, glaucoma, hypertension, diabetes, CKD and CHF.  The patient is being admitted for hypertensive emergency, acute encephalopathy, acute renal failure on CKD and elevated troponin.  I discussed with the patient's 2 sisters about her current condition, poor prognosis and CODE STATUS.  According to his sister, the patient has no health power of attorney but they want the patient in full code. Plan:  Code Status: Full code. Time spent discussing advance care planning: 20 minutes.

## 2018-06-13 NOTE — H&P (Addendum)
[place] [facility] [place] NAME: Timothy Clark    MR#:  [id number]  DATE OF BIRTH:  1971-02-07  DATE OF ADMISSION:  06/13/2018  PRIMARY CARE PHYSICIAN: [name], MD   REQUESTING/REFERRING PHYSICIAN: Dr. Mariea Clonts.  CHIEF COMPLAINT:   Chief Complaint  Patient presents with  . Code Stroke   Confusion, weakness and fall today HISTORY OF PRESENT ILLNESS:  Timothy Clark  is a 47 y.o. male with a known history of chronic abdominal pain, esophagitis, legally blind, gastroparesis and gastritis, GERD, glaucoma, hypertension, diabetes, CKD and CHF.  The patient is confused, unable to provide any information.  According to her 2 sisters, the patient has had confusion for the past several weeks.  The patient was brought to ED due to chest pain in the left facial droop and left-sided weakness this morning.  According to the sister, the patient was sent to [facility] ED yesterday, he was given Lasix for CHF and sent home.  The patient also had fall and has right-sided clavicular fracture.  According to his sisters, the patient has had chronic gastritis with nausea, vomiting and abdominal pain.  The patient has high blood pressure more than 230-240 in the ED this morning.  CAT scan of abdomen showed Diffuse mesenteric stranding and anasarca of the abdominal wall etc. but no bowel obstruction.  He has been treated with nitroglycerin drip and the blood pressure decreased to 190s.  He was found acute renal failure with creatinine up to 3.21.  He is off nitroglycerin drip.  Dr. Mariea Clonts requested admission.  PAST MEDICAL HISTORY:   Past Medical History:  Diagnosis Date  . Chronic abdominal pain   . Esophagitis   . Eye hemorrhage   . Gastritis   . Gastroparesis   . GERD (gastroesophageal reflux disease)   . Glaucoma   . Helicobacter pylori (H. pylori) infection 2016   ERADICATION DOCUMENTED VIA STOOL TEST in AUG 2016 at [place]  . Hiccups   . Hypertension   . Nausea  and vomiting    chronic, recurrent  . Type 2 diabetes mellitus with complications (HCC)    diagnosed around age 77    PAST SURGICAL HISTORY:   Past Surgical History:  Procedure Laterality Date  . CATARACT EXTRACTION W/PHACO Left 05/19/2016   Procedure: CATARACT EXTRACTION PHACO AND INTRAOCULAR LENS PLACEMENT LEFT EYE;  Surgeon: Tonny Branch, MD;  Location: [facility];  Service: Ophthalmology;  Laterality: Left;  CDE: 7.30  . CATARACT EXTRACTION W/PHACO Right 06/23/2016   Procedure: CATARACT EXTRACTION PHACO AND INTRAOCULAR LENS PLACEMENT RIGHT EYE CDE=9.87;  Surgeon: Tonny Branch, MD;  Location: [facility];  Service: Ophthalmology;  Laterality: Right;  right  . ESOPHAGOGASTRODUODENOSCOPY  2015   Dr. Britta Mccreedy  . ESOPHAGOGASTRODUODENOSCOPY N/A 10/26/2014   RMR: Distal esophagititis-likely reflux related although an element of pill induced injuruy no exclueded  status post biopsy. Diffusely abnormal gastric mucosa of uncertain signigicane -status post gastric biopsy. Focal area of excoriation in the cardia most consistant with a trauma of heaving.   . ESOPHAGOGASTRODUODENOSCOPY  08/2015   [facility]: mild esophagitis and old gastric contents  . EYE SURGERY  2015   stent placed to left eye  . EYE SURGERY    . INCISION AND DRAINAGE OF WOUND Right 07/16/2017   Procedure: IRRIGATION AND DEBRIDEMENT OF SOFT TISSUE OF ULCERATION RIGHT FOOT;  Surgeon: Caprice Beaver, DPM;  Location: [facility];  Service: Podiatry;  Laterality: Right;    SOCIAL HISTORY:  Social History   Tobacco Use  . Smoking status: Never Smoker  . Smokeless tobacco: Never Used  Substance Use Topics  . Alcohol use: No    Alcohol/week: 0.0 standard drinks    FAMILY HISTORY:   Family History  Problem Relation Age of Onset  . Ovarian cancer Mother   . Heart attack Father   . Cervical cancer Sister   . Diabetes Sister   . Colon cancer Neg Hx     DRUG ALLERGIES:   Allergies  Allergen Reactions  . Donnatal  [Pb-Hyoscy-Atropine-Scopolamine] Anaphylaxis    Reaction to GI cocktail  . Fish Allergy Anaphylaxis, Hives and Rash  . Haldol [Haloperidol Lactate] Other (See Comments)    Chest Pain  . Lidocaine Anaphylaxis    Reaction to GI cocktail  . Maalox [Calcium Carbonate Antacid] Anaphylaxis    Reaction to GI cocktail  . Lisinopril Swelling  . Marinol [Dronabinol]     Caused worsening vomiting.   . Aspirin Hives and Itching  . Bactrim [Sulfamethoxazole-Trimethoprim] Itching  . Doxycycline Hives and Nausea And Vomiting    Severe   . Keflex [Cephalexin] Hives, Itching and Nausea And Vomiting  . Penicillins Hives and Itching    Has patient had a PCN reaction causing immediate rash, facial/tongue/throat swelling, SOB or lightheadedness with hypotension: yes Has patient had a PCN reaction causing severe rash involving mucus membranes or skin necrosis: yes Has patient had a PCN reaction that required hospitalization no Has patient had a PCN reaction occurring within the last 10 years: yes If all of the above answers are "NO", then may proceed with Cephalospor    REVIEW OF SYSTEMS:   Review of Systems  Unable to perform ROS: Mental status change    MEDICATIONS AT HOME:   Prior to Admission medications   Medication Sig Start Date End Date Taking? Authorizing Provider  atorvastatin (LIPITOR) 10 MG tablet Take 10 mg by mouth at bedtime.    Yes [provider]  brimonidine (ALPHAGAN) 0.2 % ophthalmic solution Place 1 drop into both eyes 3 (three) times daily.    Yes [provider]  carvedilol (COREG) 25 MG tablet Take 25 mg by mouth 2 (two) times daily with a meal.   Yes [provider]  cloNIDine (CATAPRES) 0.2 MG tablet Take 0.2 mg by mouth 2 (two) times daily.   Yes [provider]  dorzolamide (TRUSOPT) 2 % ophthalmic solution INSTILL 1 DROPS INTO EACH EYE TWICE DAILY 04/23/18  Yes [provider]  folic acid (FOLVITE) 1 MG tablet Take 1 mg by  mouth daily.   Yes [provider]  hydrALAZINE (APRESOLINE) 50 MG tablet Take 1 tablet (50 mg total) by mouth 3 (three) times daily. 11/06/17  Yes Danford, Suann Larry, MD  insulin aspart protamine- aspart (NOVOLOG MIX 70/30) (70-30) 100 UNIT/ML injection Inject 0.25 mLs (25 Units total) into the skin 2 (two) times daily with a meal. 09/24/17  Yes Georgette Shell, MD  lisinopril (PRINIVIL,ZESTRIL) 5 MG tablet Take 5 mg by mouth daily.   Yes [provider]  pantoprazole (PROTONIX) 40 MG tablet Take 40 mg by mouth 2 (two) times daily.   Yes [provider]  potassium chloride SA (K-DUR,KLOR-CON) 20 MEQ tablet Take 1 tablet (20 mEq total) by mouth 2 (two) times daily. 11/12/17  Yes Gherghe, Vella Redhead, MD  promethazine (PHENERGAN) 25 MG tablet Take 0.5 tablets (12.5 mg total) by mouth every 6 (six) hours as needed for nausea or vomiting. To  one tablet every 6 hours 02/24/18  Yes Annitta Needs, NP  sitaGLIPtin (JANUVIA) 100 MG tablet Take 100 mg by mouth daily.   Yes [provider]  sucralfate (CARAFATE) 1 g tablet Take 1 g by mouth 4 (four) times daily.    Yes [provider]  timolol (TIMOPTIC) 0.5 % ophthalmic solution Place 1 drop into both eyes 2 (two) times daily.   Yes [provider]  Blood Glucose Monitoring Suppl (PRODIGY AUTOCODE BLOOD GLUCOSE) w/Device KIT  04/16/18   [provider]  cyanocobalamin (,VITAMIN B-12,) 1000 MCG/ML injection INJECT 1 ML (CC) INTRAMUSCULARLY ONCE A WEEK FOR 4 WEEKS THEN MONTHLY 04/19/18   [provider]  lactulose (CEPHULAC) 10 g packet Take 1 packet (10 g total) by mouth every morning. 06/09/18   Nena Polio, MD  LORazepam (ATIVAN) 1 MG tablet Take 1 mg by mouth 2 (two) times daily as needed. for anxiety 05/07/18   [provider]  sitaGLIPtin (JANUVIA) 50 MG tablet Take 50 mg by mouth daily.    [provider]  torsemide (DEMADEX) 20 MG tablet Take 1 tablet in the  mornings and 2 tablets in the evenings 04/16/18   [provider]      VITAL SIGNS:  Blood pressure (!) 203/74, pulse 66, temperature 98.6 F (37 C), resp. rate 16, weight (!) 167.8 kg, SpO2 100 %.  PHYSICAL EXAMINATION:  Physical Exam  GENERAL:  47 y.o.-year-old patient lying in the bed with no acute distress. Morbid obesity. EYES: Pupils equal, pinpoint, not reactive to light and accommodation. No scleral icterus. Extraocular muscles intact.  HEENT: Head atraumatic, normocephalic. Dry oral mucosa. NECK:  Supple, no jugular venous distention. No thyroid enlargement, no tenderness.  LUNGS: Normal breath sounds bilaterally, no wheezing, rales,rhonchi or crepitation. No use of accessory muscles of respiration.  CARDIOVASCULAR: S1, S2 normal. No murmurs, rubs, or gallops.  ABDOMEN: Soft, nontender, nondistended. Bowel sounds present. No organomegaly or mass.  EXTREMITIES: Chronic changes on bilateral legs, amputation of right toes, nocyanosis, or clubbing.  NEUROLOGIC: unable to exam. PSYCHIATRIC: The patient is confused. SKIN: No obvious rash, lesion, or ulcer.   LABORATORY PANEL:   CBC Recent Labs  Lab 06/13/18 1225  WBC 10.5  HGB 8.7*  HCT 25.7*  PLT 195   ------------------------------------------------------------------------------------------------------------------  Chemistries  Recent Labs  Lab 06/13/18 1225  NA 140  K 3.2*  CL 103  CO2 26  GLUCOSE 127*  BUN 27*  CREATININE 3.21*  CALCIUM 8.2*  AST 38  ALT 15  ALKPHOS 56  BILITOT 1.0   ------------------------------------------------------------------------------------------------------------------  Cardiac Enzymes Recent Labs  Lab 06/13/18 1225  TROPONINI 0.10*   ------------------------------------------------------------------------------------------------------------------  RADIOLOGY:  Ct Abdomen Pelvis Wo Contrast  Result Date: 06/13/2018 CLINICAL DATA:  Abdominal pain and vomiting.  EXAM: CT ABDOMEN AND PELVIS WITHOUT CONTRAST TECHNIQUE: Multidetector CT imaging of the abdomen and pelvis was performed following the standard protocol without IV contrast. COMPARISON:  06/09/2018 FINDINGS: Lower chest: Small pericardial effusion with hemorrhage thickness of 10 mm. No airspace consolidation. Right gynecomastia. Hepatobiliary: Hepatic steatosis. Probable layering gallbladder sludge. Pancreas: Unremarkable. No pancreatic ductal dilatation or surrounding inflammatory changes. Spleen: Normal in size without focal abnormality. Adrenals/Urinary Tract: Normal adrenal glands. Normal appearance of the kidneys and ureters. The urinary bladder is decompressed around urinary Foley. Likely iatrogenic gas within the urinary bladder. Stomach/Bowel: Stomach is within normal limits. The appendix is upper limits of normal by thickness measuring 9 mm. No periappendiceal inflammatory changes or appendicoliths are  seen. No evidence of bowel wall thickening, distention, or inflammatory changes. Vascular/Lymphatic: No significant vascular findings are present. No enlarged abdominal or pelvic lymph nodes. Reproductive: Prostate is unremarkable. Other: Fat containing right inguinal hernia. No abdominopelvic ascites. Mild mesenteric stranding. Diffuse subcutaneous edema. Musculoskeletal: Lumbosacral spine spondylosis. IMPRESSION: Small pericardial effusion. Hepatic steatosis. Gallbladder sludge. Borderline thickening of the appendix, likely incidental, as there are no secondary signs of appendicitis. Diffuse mesenteric stranding and anasarca of the abdominal wall. Electronically Signed   By: Fidela Salisbury M.D.   On: 06/13/2018 15:06   Dg Chest 1 View  Result Date: 06/13/2018 CLINICAL DATA:  Chest pain. EXAM: CHEST  1 VIEW COMPARISON:  06/11/2018 FINDINGS: Enlarged cardiac silhouette.  Mediastinal contours appear intact. There is no evidence of focal airspace consolidation, pleural effusion or pneumothorax. Osseous  structures are without acute abnormality. Soft tissues are grossly normal. IMPRESSION: Enlarged cardiac silhouette. No significant pulmonary edema. Electronically Signed   By: Fidela Salisbury M.D.   On: 06/13/2018 14:52   Dg Clavicle Right  Result Date: 06/13/2018 CLINICAL DATA:  Status post fall on the right shoulder. EXAM: RIGHT CLAVICLE - 2+ VIEWS COMPARISON:  None. FINDINGS: There is a comminuted displaced fracture of the distal right clavicle with overriding of the fracture fragments and superior dislocation of the distal fracture fragment. There is a butterfly fragment inferior to the clavicle. The acromioclavicular joint appears intact. Overlying soft tissue swelling. IMPRESSION: Comminuted displaced impacted fracture of the distal right clavicle. Electronically Signed   By: Fidela Salisbury M.D.   On: 06/13/2018 14:54   Ct Head Code Stroke Wo Contrast  Result Date: 06/13/2018 CLINICAL DATA:  Code stroke. Acute onset of left-sided facial droop beginning at 11:30 a.m. today. Is EXAM: CT HEAD WITHOUT CONTRAST TECHNIQUE: Contiguous axial images were obtained from the base of the skull through the vertex without intravenous contrast. COMPARISON:  CT head without contrast 05/13/2018. FINDINGS: Brain: No acute infarct, hemorrhage, or mass lesion is present. The ventricles are of normal size. No significant extraaxial fluid collection is present. Significant white matter disease is present. The brainstem and cerebellum are normal. Vascular: No hyperdense vessel or unexpected calcification. Skull: Calvarium is intact. No focal lytic or blastic lesions are present. Focal hyperdensity in the right parietal scalp is stable, chronic scarring. Sinuses/Orbits: The paranasal sinuses and mastoid air cells are clear. Globes and orbits are within normal limits. Left lacrimal gland stent is again noted. ASPECTS [facility] Stroke Program Early CT Score) - Ganglionic level infarction (caudate, lentiform nuclei,  internal capsule, insula, M1-M3 cortex): 7/7 - Supraganglionic infarction (M4-M6 cortex): 3/3 Total score (0-10 with 10 being normal): 10/10 IMPRESSION: 1. Normal CT appearance of brain 2. Stable scarring in the right parietal scalp. 3. Left lacrimal gland stents 4. ASPECTS is 10/10 These results were called by telephone at the time of interpretation on 06/13/2018 at 12:34 pm to Dr. Larae Grooms , who verbally acknowledged these results. Electronically Signed   By: San Morelle M.D.   On: 06/13/2018 12:36      IMPRESSION AND PLAN:   Hypertensive emergency. The patient will be admitted to telemetry floor. Continue Coreg and hydralazine, hold lisinopril and torsemide due to renal failure.  Change clonidine from p.o. to patch.  IV hydralazine and labetalol as needed.  Acute metabolic encephalopathy, possible due to above. Aspiration the fall precaution.  MRI of the brain to rule out CVA.  Gastroparesis.  Reglan as needed.  Supportive care.  Acute renal failure on CKD stage III.  Start normal saline IV and follow-up BMP.  Nephrology consult.  Hypokalemia.  Potassium supplement, follow-up BMP.  Elevated troponin.  Due to demanding ischemia secondary to hypertension emergency and renal failure. The patient has allergy to aspirin, start Plavix, increase Lipitor to 40 mg and follow-up troponin level.  Cardiology consult.  Diabetes.  Start sliding scale.  History of chronic diastolic CHF.  Stable.  Hold torsemide due to renal failure.  Anemia of chronic disease.  Stable.  Morbid obesity.   All the records are reviewed and case discussed with ED provider. Management plans discussed with the patient's 2 sisters and stepson and they are in agreement.  CODE STATUS: Full code  TOTAL TIME TAKING CARE OF THIS PATIENT: 42 minutes.    Demetrios Loll M.D on 06/13/2018 at 5:01 PM  Between 7am to 6pm - Pager - [phone]  After 6pm go to [email or web address] - Proofreader  [employer]  [facility]  Office  [phone]  CC: Primary care physician; [name], MD   Note: This dictation was prepared with Dragon dictation along with smaller phrase technology. Any transcriptional errors that result from this process are unin

## 2018-06-14 ENCOUNTER — Inpatient Hospital Stay: Payer: Medicare HMO

## 2018-06-14 ENCOUNTER — Inpatient Hospital Stay: Payer: Self-pay

## 2018-06-14 ENCOUNTER — Ambulatory Visit: Payer: Self-pay | Admitting: Pharmacy Technician

## 2018-06-14 LAB — BASIC METABOLIC PANEL
Anion gap: 9 (ref 5–15)
BUN: 28 mg/dL — ABNORMAL HIGH (ref 6–20)
CO2: 27 mmol/L (ref 22–32)
Calcium: 7.8 mg/dL — ABNORMAL LOW (ref 8.9–10.3)
Chloride: 106 mmol/L (ref 98–111)
Creatinine, Ser: 3.07 mg/dL — ABNORMAL HIGH (ref 0.61–1.24)
GFR calc Af Amer: 26 mL/min — ABNORMAL LOW (ref >60)
GFR calc non Af Amer: 22 mL/min — ABNORMAL LOW (ref >60)
Glucose, Bld: 124 mg/dL — ABNORMAL HIGH (ref 70–99)
Potassium: 2.6 mmol/L — CL (ref 3.5–5.1)
Sodium: 142 mmol/L (ref 135–145)

## 2018-06-14 LAB — CBC
HCT: 23.5 % — ABNORMAL LOW (ref 39.0–52.0)
Hemoglobin: 7.9 g/dL — ABNORMAL LOW (ref 13.0–17.0)
MCH: 31.3 pg (ref 26.0–34.0)
MCHC: 33.6 g/dL (ref 30.0–36.0)
MCV: 93.3 fL (ref 80.0–100.0)
Platelets: 163 10*3/uL (ref 150–400)
RBC: 2.52 MIL/uL — ABNORMAL LOW (ref 4.22–5.81)
RDW: 13 % (ref 11.5–15.5)
WBC: 8.2 10*3/uL (ref 4.0–10.5)
nRBC: 0 % (ref 0.0–0.2)

## 2018-06-14 LAB — LIPID PANEL
Cholesterol: 155 mg/dL (ref 0–200)
HDL: 30 mg/dL — ABNORMAL LOW (ref >40)
LDL Cholesterol: 97 mg/dL (ref 0–99)
Total CHOL/HDL Ratio: 5.2 RATIO
Triglycerides: 141 mg/dL (ref <150)
VLDL: 28 mg/dL (ref 0–40)

## 2018-06-14 LAB — GLUCOSE, CAPILLARY
Glucose-Capillary: 115 mg/dL — ABNORMAL HIGH (ref 70–99)
Glucose-Capillary: 138 mg/dL — ABNORMAL HIGH (ref 70–99)
Glucose-Capillary: 165 mg/dL — ABNORMAL HIGH (ref 70–99)
Glucose-Capillary: 119 mg/dL — ABNORMAL HIGH (ref 70–99)

## 2018-06-14 LAB — TROPONIN I: Troponin I: 0.1 ng/mL (ref <0.03)

## 2018-06-14 MED ORDER — CLONIDINE HCL 0.1 MG PO TABS
0.2000 mg | ORAL_TABLET | Freq: Every day | ORAL | Status: AC
  Administered 2018-06-14: 0.2 mg via ORAL
  Filled 2018-06-14: qty 2

## 2018-06-14 MED ORDER — CLONIDINE HCL 0.3 MG/24HR TD PTWK
0.3000 mg | MEDICATED_PATCH | TRANSDERMAL | Status: DC
  Administered 2018-06-14: 0.3 mg via TRANSDERMAL
  Filled 2018-06-14: qty 1

## 2018-06-14 MED ORDER — HYDRALAZINE HCL 50 MG PO TABS
100.0000 mg | ORAL_TABLET | Freq: Three times a day (TID) | ORAL | Status: DC
  Administered 2018-06-14 – 2018-06-16 (×7): 100 mg via ORAL
  Filled 2018-06-14 (×7): qty 2

## 2018-06-14 MED ORDER — POTASSIUM CHLORIDE CRYS ER 10 MEQ PO TBCR
EXTENDED_RELEASE_TABLET | ORAL | Status: AC
  Filled 2018-06-14: qty 2

## 2018-06-14 MED ORDER — SODIUM CHLORIDE 0.9% FLUSH
3.0000 mL | Freq: Two times a day (BID) | INTRAVENOUS | Status: DC
  Administered 2018-06-15 (×3): 3 mL via INTRAVENOUS

## 2018-06-14 MED ORDER — CLONIDINE HCL 0.1 MG PO TABS
0.1000 mg | ORAL_TABLET | Freq: Every day | ORAL | Status: AC
  Administered 2018-06-15: 0.1 mg via ORAL
  Filled 2018-06-14: qty 1

## 2018-06-14 MED ORDER — POTASSIUM CHLORIDE CRYS ER 20 MEQ PO TBCR
40.0000 meq | EXTENDED_RELEASE_TABLET | ORAL | Status: AC
  Administered 2018-06-14 (×2): 40 meq via ORAL
  Filled 2018-06-14 (×2): qty 2

## 2018-06-14 NOTE — Progress Notes (Addendum)
Patient with R clavicle fracture. L arm 3+ edema. Patient with Renal impairment as well. Not a good candidate for Bedside PICC placement. PIV obtained with [place]. Recommend contacting primary MD to verify order for PICC now that IV has been established. Primary RN notified.

## 2018-06-14 NOTE — Consult Note (Signed)
Date: 06/14/2018                  Patient Name:  STANLEE ROEHRIG  MRN: [id number]  DOB: [birth year]  Age / Sex: 47 y.o., male         PCP: [name], MD                 Service Requesting Consult:  Internal medicine/ Epifanio Lesches, MD                 Reason for Consult:  Acute renal failure            History of Present Illness: Patient is a 47 y.o. African-American male with medical problems of long-standing diabetes, who was admitted to [facility] on 06/13/2018 for evaluation of confusion, weakness and fall.  He presented to the emergency room with these problems.  At the time of presentation, patient's blood pressure was severely elevated at 203/103.  His baseline creatinine from outpatient records is 1.91/GFR 47 from March 10, 2018 Presenting creatinine was 3.20/GFR 25 on October 30 Today's creatinine has slightly improved to 3.07/GFR 26 Patient underwent work-up for code stroke but it was negative He does not have any shortness of breath but does have leg edema   Medications: Outpatient medications: Medications Prior to Admission  Medication Sig Dispense Refill Last Dose  . atorvastatin (LIPITOR) 10 MG tablet Take 10 mg by mouth at bedtime.    Taking  . brimonidine (ALPHAGAN) 0.2 % ophthalmic solution Place 1 drop into both eyes 3 (three) times daily.    Taking  . carvedilol (COREG) 25 MG tablet Take 25 mg by mouth 2 (two) times daily with a meal.     . cloNIDine (CATAPRES) 0.2 MG tablet Take 0.2 mg by mouth 2 (two) times daily.   Taking  . dorzolamide (TRUSOPT) 2 % ophthalmic solution INSTILL 1 DROPS INTO EACH EYE TWICE DAILY  11 Taking  . folic acid (FOLVITE) 1 MG tablet Take 1 mg by mouth daily.     . hydrALAZINE (APRESOLINE) 50 MG tablet Take 1 tablet (50 mg total) by mouth 3 (three) times daily. 90 tablet 6 Taking  . insulin aspart protamine- aspart (NOVOLOG MIX 70/30) (70-30) 100 UNIT/ML injection Inject 0.25 mLs (25 Units total) into the skin 2 (two) times daily with  a meal. 10 mL 11 Taking  . lisinopril (PRINIVIL,ZESTRIL) 5 MG tablet Take 5 mg by mouth daily.   Taking  . pantoprazole (PROTONIX) 40 MG tablet Take 40 mg by mouth 2 (two) times daily.   Taking  . potassium chloride SA (K-DUR,KLOR-CON) 20 MEQ tablet Take 1 tablet (20 mEq total) by mouth 2 (two) times daily. 20 tablet 0 Taking  . promethazine (PHENERGAN) 25 MG tablet Take 0.5 tablets (12.5 mg total) by mouth every 6 (six) hours as needed for nausea or vomiting. To one tablet every 6 hours 40 tablet 3 Taking  . sitaGLIPtin (JANUVIA) 100 MG tablet Take 100 mg by mouth daily.     . sucralfate (CARAFATE) 1 g tablet Take 1 g by mouth 4 (four) times daily.    Taking  . timolol (TIMOPTIC) 0.5 % ophthalmic solution Place 1 drop into both eyes 2 (two) times daily.   Taking  . Blood Glucose Monitoring Suppl (PRODIGY AUTOCODE BLOOD GLUCOSE) w/Device KIT    Taking  . cyanocobalamin (,VITAMIN B-12,) 1000 MCG/ML injection INJECT 1 ML (CC) INTRAMUSCULARLY ONCE A WEEK FOR 4 WEEKS THEN MONTHLY  1  Taking  . lactulose (CEPHULAC) 10 g packet Take 1 packet (10 g total) by mouth every morning. 30 each 0   . LORazepam (ATIVAN) 1 MG tablet Take 1 mg by mouth 2 (two) times daily as needed. for anxiety  3 Taking  . sitaGLIPtin (JANUVIA) 50 MG tablet Take 50 mg by mouth daily.   Taking  . torsemide (DEMADEX) 20 MG tablet Take 1 tablet in the mornings and 2 tablets in the evenings  5 Taking    Current medications: Current Facility-Administered Medications  Medication Dose Route Frequency Provider Last Rate Last Dose  . acetaminophen (TYLENOL) tablet 650 mg  650 mg Oral Q6H PRN Demetrios Loll, MD       Or  . acetaminophen (TYLENOL) suppository 650 mg  650 mg Rectal Q6H PRN Demetrios Loll, MD      . atorvastatin (LIPITOR) tablet 40 mg  40 mg Oral QHS Demetrios Loll, MD      . bisacodyl (DULCOLAX) EC tablet 5 mg  5 mg Oral Daily PRN Demetrios Loll, MD      . brimonidine (ALPHAGAN) 0.2 % ophthalmic solution 1 drop  1 drop Both Eyes TID  Demetrios Loll, MD   1 drop at 06/14/18 0847  . carvedilol (COREG) tablet 25 mg  25 mg Oral BID WC Demetrios Loll, MD   25 mg at 06/14/18 0842  . cloNIDine (CATAPRES - Dosed in mg/24 hr) patch 0.3 mg  0.3 mg Transdermal Weekly Epifanio Lesches, MD      . Derrill Memo ON 06/15/2018] cloNIDine (CATAPRES) tablet 0.1 mg  0.1 mg Oral Daily Epifanio Lesches, MD      . clopidogrel (PLAVIX) tablet 75 mg  75 mg Oral Daily Demetrios Loll, MD   75 mg at 06/14/18 0842  . dorzolamide (TRUSOPT) 2 % ophthalmic solution 1 drop  1 drop Both Eyes BID Demetrios Loll, MD   1 drop at 06/14/18 0846  . folic acid (FOLVITE) tablet 1 mg  1 mg Oral Daily Demetrios Loll, MD   1 mg at 06/14/18 0845  . heparin injection 5,000 Units  5,000 Units Subcutaneous Robynn Pane, MD   5,000 Units at 06/14/18 1236  . hydrALAZINE (APRESOLINE) injection 10 mg  10 mg Intravenous Q6H PRN Demetrios Loll, MD      . hydrALAZINE (APRESOLINE) tablet 100 mg  100 mg Oral TID Epifanio Lesches, MD      . HYDROcodone-acetaminophen (NORCO/VICODIN) 5-325 MG per tablet 1-2 tablet  1-2 tablet Oral Q4H PRN Demetrios Loll, MD   2 tablet at 06/14/18 0845  . insulin aspart (novoLOG) injection 0-5 Units  0-5 Units Subcutaneous QHS Demetrios Loll, MD      . insulin aspart (novoLOG) injection 0-9 Units  0-9 Units Subcutaneous TID WC Demetrios Loll, MD   1 Units at 06/14/18 1236  . insulin aspart protamine- aspart (NOVOLOG MIX 70/30) injection 25 Units  25 Units Subcutaneous BID WC Demetrios Loll, MD   25 Units at 06/14/18 [phone]  . labetalol (NORMODYNE,TRANDATE) injection 10 mg  10 mg Intravenous Q6H PRN Demetrios Loll, MD      . lactulose (CHRONULAC) 10 GM/15ML solution 10 g  10 g Oral q morning - 10a Demetrios Loll, MD   10 g at 06/14/18 0844  . LORazepam (ATIVAN) tablet 1 mg  1 mg Oral BID PRN Demetrios Loll, MD      . morphine 4 MG/ML injection 4 mg  4 mg Intravenous Q4H PRN Demetrios Loll, MD      . potassium  chloride (K-DUR,KLOR-CON) 10 MEQ CR tablet           . potassium chloride SA (K-DUR,KLOR-CON) CR  tablet 40 mEq  40 mEq Oral Q4H Epifanio Lesches, MD   40 mEq at 06/14/18 1339  . senna-docusate (Senokot-S) tablet 1 tablet  1 tablet Oral QHS PRN Demetrios Loll, MD      . sucralfate (CARAFATE) tablet 1 g  1 g Oral QID Demetrios Loll, MD   1 g at 06/14/18 1237  . timolol (TIMOPTIC) 0.5 % ophthalmic solution 1 drop  1 drop Both Eyes BID Demetrios Loll, MD   1 drop at 06/14/18 0848      Allergies: Allergies  Allergen Reactions  . Donnatal [Pb-Hyoscy-Atropine-Scopolamine] Anaphylaxis    Reaction to GI cocktail  . Fish Allergy Anaphylaxis, Hives and Rash  . Haldol [Haloperidol Lactate] Other (See Comments)    Chest Pain  . Lidocaine Anaphylaxis    Reaction to GI cocktail  . Maalox [Calcium Carbonate Antacid] Anaphylaxis    Reaction to GI cocktail  . Lisinopril Swelling  . Marinol [Dronabinol]     Caused worsening vomiting.   . Aspirin Hives and Itching  . Bactrim [Sulfamethoxazole-Trimethoprim] Itching  . Doxycycline Hives and Nausea And Vomiting    Severe   . Keflex [Cephalexin] Hives, Itching and Nausea And Vomiting  . Penicillins Hives and Itching    Has patient had a PCN reaction causing immediate rash, facial/tongue/throat swelling, SOB or lightheadedness with hypotension: yes Has patient had a PCN reaction causing severe rash involving mucus membranes or skin necrosis: yes Has patient had a PCN reaction that required hospitalization no Has patient had a PCN reaction occurring within the last 10 years: yes If all of the above answers are "NO", then may proceed with Cephalospor      Past Medical History: Past Medical History:  Diagnosis Date  . Chronic abdominal pain   . Esophagitis   . Eye hemorrhage   . Gastritis   . Gastroparesis   . GERD (gastroesophageal reflux disease)   . Glaucoma   . Helicobacter pylori (H. pylori) infection 2016   ERADICATION DOCUMENTED VIA STOOL TEST in AUG 2016 at [place]  . Hiccups   . Hypertension   . Nausea and vomiting    chronic, recurrent   . Type 2 diabetes mellitus with complications (HCC)    diagnosed around age 89     Past Surgical History: Past Surgical History:  Procedure Laterality Date  . CATARACT EXTRACTION W/PHACO Left 05/19/2016   Procedure: CATARACT EXTRACTION PHACO AND INTRAOCULAR LENS PLACEMENT LEFT EYE;  Surgeon: Tonny Branch, MD;  Location: [facility];  Service: Ophthalmology;  Laterality: Left;  CDE: 7.30  . CATARACT EXTRACTION W/PHACO Right 06/23/2016   Procedure: CATARACT EXTRACTION PHACO AND INTRAOCULAR LENS PLACEMENT RIGHT EYE CDE=9.87;  Surgeon: Tonny Branch, MD;  Location: [facility];  Service: Ophthalmology;  Laterality: Right;  right  . ESOPHAGOGASTRODUODENOSCOPY  2015   Dr. Britta Mccreedy  . ESOPHAGOGASTRODUODENOSCOPY N/A 10/26/2014   RMR: Distal esophagititis-likely reflux related although an element of pill induced injuruy no exclueded  status post biopsy. Diffusely abnormal gastric mucosa of uncertain signigicane -status post gastric biopsy. Focal area of excoriation in the cardia most consistant with a trauma of heaving.   . ESOPHAGOGASTRODUODENOSCOPY  08/2015   [facility]: mild esophagitis and old gastric contents  . EYE SURGERY  2015   stent placed to left eye  . EYE SURGERY    . INCISION AND DRAINAGE OF WOUND Right 07/16/2017  Procedure: IRRIGATION AND DEBRIDEMENT OF SOFT TISSUE OF ULCERATION RIGHT FOOT;  Surgeon: Caprice Beaver, DPM;  Location: [facility];  Service: Podiatry;  Laterality: Right;     Family History: Family History  Problem Relation Age of Onset  . Ovarian cancer Mother   . Heart attack Father   . Cervical cancer Sister   . Diabetes Sister   . Colon cancer Neg Hx      Social History: Social History   Socioeconomic History  . Marital status: Single    Spouse name: Not on file  . Number of children: Not on file  . Years of education: Not on file  . Highest education level: Not on file  Occupational History  . Not on file  Social Needs  . Financial resource strain: Not on file  .  Food insecurity:    Worry: Not on file    Inability: Not on file  . Transportation needs:    Medical: Not on file    Non-medical: Not on file  Tobacco Use  . Smoking status: Never Smoker  . Smokeless tobacco: Never Used  Substance and Sexual Activity  . Alcohol use: No    Alcohol/week: 0.0 standard drinks  . Drug use: No  . Sexual activity: Yes  Lifestyle  . Physical activity:    Days per week: Not on file    Minutes per session: Not on file  . Stress: Not on file  Relationships  . Social connections:    Talks on phone: Not on file    Gets together: Not on file    Attends religious service: Not on file    Active member of club or organization: Not on file    Attends meetings of clubs or organizations: Not on file    Relationship status: Not on file  . Intimate partner violence:    Fear of current or ex partner: Not on file    Emotionally abused: Not on file    Physically abused: Not on file    Forced sexual activity: Not on file  Other Topics Concern  . Not on file  Social History Narrative  . Not on file     Review of Systems: Gen: No fevers or chills HEENT: Poor vision due to severe diabetic retinopathy, able to see some from left eye CV: No chest pain or shortness of breath.  No history of MI or cardiac disease Resp: No history of lung problems GI: Reports severe gastroparesis GU : Denies any history of kidney stones or hematuria.  No history of prostate problems MS: Right foot affected from diabetes, states he is ambulatory at home Derm:  No complaints Psych: No complaints Heme: No complaints Neuro: Reports peripheral neuropathy Endocrine.  Diabetes poorly controlled in the past  Vital Signs: Blood pressure (!) 181/85, pulse 70, temperature 99.2 F (37.3 C), temperature source Oral, resp. rate (!) 22, weight (!) 167.8 kg, SpO2 100 %.   Intake/Output Summary (Last 24 hours) at 06/14/2018 1609 Last data filed at 06/14/2018 1408 Gross per 24 hour  Intake  480 ml  Output 700 ml  Net -220 ml    Weight trends: [employer]   06/13/18 1234  Weight: (!) 167.8 kg    Physical Exam: General:  No acute distress, laying in the bed  HEENT  blind in right eye, left eye poor vision  Neck:  Supple, no distended neck veins  Lungs:  Normal breathing effort, clear to auscultation  Heart::  Regular, no rub  Abdomen:  Soft, nontender, obese  Extremities:  1-2+ edema over legs, left arm edema  Neurologic:  Alert, oriented, able to answer questions appropriately  Skin:  Dry skin             Lab results: Basic Metabolic Panel: Recent Labs  Lab 06/09/18 1146 06/13/18 1225 06/13/18 2036 06/14/18 0211  NA 139 140  --  142  K 3.3* 3.2*  --  2.6*  CL 103 103  --  106  CO2 25 26  --  27  GLUCOSE 206* 127*  --  124*  BUN 23* 27*  --  28*  CREATININE 3.20* 3.21* 3.13* 3.07*  CALCIUM 8.2* 8.2*  --  7.8*    Liver Function Tests: Recent Labs  Lab 06/13/18 1225  AST 38  ALT 15  ALKPHOS 56  BILITOT 1.0  PROT 6.0*  ALBUMIN 2.6*   No results for input(s): LIPASE, AMYLASE in the last 168 hours. No results for input(s): AMMONIA in the last 168 hours.  CBC: Recent Labs  Lab 06/13/18 1225 06/14/18 0211  WBC 10.5 8.2  NEUTROABS 8.1*  --   HGB 8.7* 7.9*  HCT 25.7* 23.5*  MCV 93.5 93.3  PLT 195 163    Cardiac Enzymes: Recent Labs  Lab 06/14/18 0211  TROPONINI 0.10*    BNP: Invalid input(s): POCBNP  CBG: Recent Labs  Lab 06/09/18 1221 06/13/18 1210 06/14/18 0825 06/14/18 1128  GLUCAP 187* 124* 115* 138*    Microbiology: No results found for this or any previous visit (from the past 720 hour(s)).   Coagulation Studies: Recent Labs    06/13/18 1225  LABPROT 13.6  INR 1.05    Urinalysis: Recent Labs    06/13/18 1316  COLORURINE YELLOW*  LABSPEC 1.019  PHURINE 6.0  GLUCOSEU 150*  HGBUR SMALL*  BILIRUBINUR NEGATIVE  KETONESUR 5*  PROTEINUR >=300*  NITRITE NEGATIVE  LEUKOCYTESUR NEGATIVE         Imaging: Ct Abdomen Pelvis Wo Contrast  Result Date: 06/13/2018 CLINICAL DATA:  Abdominal pain and vomiting. EXAM: CT ABDOMEN AND PELVIS WITHOUT CONTRAST TECHNIQUE: Multidetector CT imaging of the abdomen and pelvis was performed following the standard protocol without IV contrast. COMPARISON:  06/09/2018 FINDINGS: Lower chest: Small pericardial effusion with hemorrhage thickness of 10 mm. No airspace consolidation. Right gynecomastia. Hepatobiliary: Hepatic steatosis. Probable layering gallbladder sludge. Pancreas: Unremarkable. No pancreatic ductal dilatation or surrounding inflammatory changes. Spleen: Normal in size without focal abnormality. Adrenals/Urinary Tract: Normal adrenal glands. Normal appearance of the kidneys and ureters. The urinary bladder is decompressed around urinary Foley. Likely iatrogenic gas within the urinary bladder. Stomach/Bowel: Stomach is within normal limits. The appendix is upper limits of normal by thickness measuring 9 mm. No periappendiceal inflammatory changes or appendicoliths are seen. No evidence of bowel wall thickening, distention, or inflammatory changes. Vascular/Lymphatic: No significant vascular findings are present. No enlarged abdominal or pelvic lymph nodes. Reproductive: Prostate is unremarkable. Other: Fat containing right inguinal hernia. No abdominopelvic ascites. Mild mesenteric stranding. Diffuse subcutaneous edema. Musculoskeletal: Lumbosacral spine spondylosis. IMPRESSION: Small pericardial effusion. Hepatic steatosis. Gallbladder sludge. Borderline thickening of the appendix, likely incidental, as there are no secondary signs of appendicitis. Diffuse mesenteric stranding and anasarca of the abdominal wall. Electronically Signed   By: Fidela Salisbury M.D.   On: 06/13/2018 15:06   Dg Chest 1 View  Result Date: 06/13/2018 CLINICAL DATA:  Chest pain. EXAM: CHEST  1 VIEW COMPARISON:  06/11/2018 FINDINGS: Enlarged cardiac silhouette.   Mediastinal contours appear intact.  There is no evidence of focal airspace consolidation, pleural effusion or pneumothorax. Osseous structures are without acute abnormality. Soft tissues are grossly normal. IMPRESSION: Enlarged cardiac silhouette. No significant pulmonary edema. Electronically Signed   By: Fidela Salisbury M.D.   On: 06/13/2018 14:52   Dg Clavicle Right  Result Date: 06/13/2018 CLINICAL DATA:  Status post fall on the right shoulder. EXAM: RIGHT CLAVICLE - 2+ VIEWS COMPARISON:  None. FINDINGS: There is a comminuted displaced fracture of the distal right clavicle with overriding of the fracture fragments and superior dislocation of the distal fracture fragment. There is a butterfly fragment inferior to the clavicle. The acromioclavicular joint appears intact. Overlying soft tissue swelling. IMPRESSION: Comminuted displaced impacted fracture of the distal right clavicle. Electronically Signed   By: Fidela Salisbury M.D.   On: 06/13/2018 14:54   Mr Brain Wo Contrast  Result Date: 06/13/2018 CLINICAL DATA:  Altered level of consciousness. Left-sided weakness. EXAM: MRI HEAD WITHOUT CONTRAST TECHNIQUE: Multiplanar, multiecho pulse sequences of the brain and surrounding structures were obtained without intravenous contrast. COMPARISON:  CT head 06/13/2018 FINDINGS: Brain: Negative for acute infarct. Scattered small white matter hyperintensities bilaterally, mild in degree. Negative for hemorrhage or mass. Ventricle size normal. Vascular: Normal arterial flow void Skull and upper cervical spine: Negative Sinuses/Orbits: Paranasal sinuses clear. Bilateral cataract surgery. Cyst within the left superolateral orbit appears to be an implant. Other: None IMPRESSION: Negative for acute infarct. Mild chronic white matter changes most likely due to microvascular ischemia. Electronically Signed   By: Franchot Gallo M.D.   On: 06/13/2018 18:56   US Venous Img Upper Uni Left  Result Date:  06/14/2018 CLINICAL DATA:  Left arm swelling.  IV site infiltration. EXAM: LEFT UPPER EXTREMITY VENOUS DOPPLER ULTRASOUND TECHNIQUE: Gray-scale sonography with graded compression, as well as color Doppler and duplex ultrasound were performed to evaluate the upper extremity deep venous system from the level of the subclavian vein and including the jugular, axillary, basilic, radial, ulnar and upper cephalic vein. Spectral Doppler was utilized to evaluate flow at rest and with distal augmentation maneuvers. COMPARISON:  None. FINDINGS: Contralateral Subclavian Vein: Respiratory phasicity is normal and symmetric with the symptomatic side. No evidence of thrombus. Internal Jugular Vein: No evidence of thrombus. Normal compressibility, respiratory phasicity and response to augmentation. Subclavian Vein: No evidence of thrombus. Normal compressibility, respiratory phasicity and response to augmentation. Axillary Vein: No evidence of thrombus. Normal compressibility, respiratory phasicity and response to augmentation. Cephalic Vein: No evidence of thrombus. Normal compressibility, respiratory phasicity and response to augmentation. Basilic Vein: No evidence of thrombus. Normal compressibility, respiratory phasicity and response to augmentation. Brachial Veins: No evidence of thrombus. Normal compressibility, respiratory phasicity and response to augmentation. Radial Veins: No evidence of thrombus. Normal compressibility and color Doppler flow. Ulnar Veins: No evidence of thrombus. Normal compressibility and color Doppler flow. Venous Reflux:  None visualized. Other Findings:  Subcutaneous edema at the prior IV site. IMPRESSION: No evidence of DVT within the left upper extremity. Electronically Signed   By: Markus Daft M.D.   On: 06/14/2018 15:44   Ct Head Code Stroke Wo Contrast  Result Date: 06/13/2018 CLINICAL DATA:  Code stroke. Acute onset of left-sided facial droop beginning at 11:30 a.m. today. Is EXAM: CT HEAD  WITHOUT CONTRAST TECHNIQUE: Contiguous axial images were obtained from the base of the skull through the vertex without intravenous contrast. COMPARISON:  CT head without contrast 05/13/2018. FINDINGS: Brain: No acute infarct, hemorrhage, or mass lesion is present. The ventricles are  of normal size. No significant extraaxial fluid collection is present. Significant white matter disease is present. The brainstem and cerebellum are normal. Vascular: No hyperdense vessel or unexpected calcification. Skull: Calvarium is intact. No focal lytic or blastic lesions are present. Focal hyperdensity in the right parietal scalp is stable, chronic scarring. Sinuses/Orbits: The paranasal sinuses and mastoid air cells are clear. Globes and orbits are within normal limits. Left lacrimal gland stent is again noted. ASPECTS [facility] Stroke Program Early CT Score) - Ganglionic level infarction (caudate, lentiform nuclei, internal capsule, insula, M1-M3 cortex): 7/7 - Supraganglionic infarction (M4-M6 cortex): 3/3 Total score (0-10 with 10 being normal): 10/10 IMPRESSION: 1. Normal CT appearance of brain 2. Stable scarring in the right parietal scalp. 3. Left lacrimal gland stents 4. ASPECTS is 10/10 These results were called by telephone at the time of interpretation on 06/13/2018 at 12:34 pm to Dr. Larae Grooms , who verbally acknowledged these results. Electronically Signed   By: San Morelle M.D.   On: 06/13/2018 12:36      Assessment & Plan: Pt is a 47 y.o. African-American  male with diabetes type 2 with severe complications of peripheral neuropathy, severe retinopathy, nephropathy, obesity, chronic kidney disease, recent right clavicular fracture was admitted on 06/13/2018 with generalized weakness, hypertensive urgency.   1.  Acute renal failure on chronic kidney disease stage 3 and proteinuria Baseline creatinine 1.91, GFR 47 from July 2019 Underlying chronic kidney disease is likely secondary to diabetic  nephropathy Acute kidney injury likely secondary to hemodynamic instability, hypertensive urgency versus progression of underlying chronic kidney disease -Continue to try to achieve better blood pressure control -Obtain urine protein to creatinine ratio and serological work-up  2.  Diabetes type 2 with chronic kidney disease, insulin-dependent Diabetes for poorly controlled in the past according to patient.  Hemoglobin A1c 12.7 in 2011   Ref. Range 06/13/2018 20:36  Hemoglobin A1C Latest Ref Range: 4.8 - 5.6 % 6.4 (H)   3.  Hypertension with CKD  Poorly controlled but resistant hypertension Current regimen includes Coreg 25 mg twice a day Clonidine patch 0.3 mg  Hydralazine 100 mg 3 times a day -Patient was taking a low-dose of ACE inhibitor at home We will start low-dose losartan  4.  Severe hypokalemia We will obtain aldosterone renin ratio and magnesium level  5.  Lower extremity edema We will consider small dose of torsemide in the near future     LOS: 1 Danna Casella [facility] 11/4/20194:09 PM  [place], [place]  Note: This note was prepared with Dragon dictation. Any transcription errors are unintentional

## 2018-06-14 NOTE — Progress Notes (Addendum)
[facility] [place] NAME: Timothy Clark    MR#:  [id number]  DATE OF BIRTH:  [birth year]  SUBJECTIVE: Patient admitted for confusion, weakness, fall.  He presented to the emergency room for same problem and according to sisters patient had a fall and had a clavicle fracture on the right side, is found to have severely elevated blood pressure with systolic up to 390 the ED.  Patient received multiple blood test including CT head, nitro drip because of blood pressure.  CT abdomen also was done because he complained of abdominal pain.  CHIEF COMPLAINT:   Chief Complaint  Patient presents with  . Code Stroke   Haldol in the emergency room because of agitation, and now has a Actuary.  He is very alert, awake, oriented, denies abdominal pain and very cooperative.  He says that he did not have BM for last 4 days and had a BM yesterday and denies any abdominal pain.  Left arm IV site infiltrated.  Denies any pain in the left arm.  Had history of right clavicle fracture.  thatTime patient had syncope, seen in the emergency room,  anddischarged.    REVIEW OF SYSTEMS:   ROS CONSTITUTIONAL: No fever, fatigue or weakness.  EYES: No blurred or double vision.  EARS, NOSE, AND THROAT: No tinnitus or ear pain.  RESPIRATORY: No cough, shortness of breath, wheezing or hemoptysis.  CARDIOVASCULAR: No chest pain, orthopnea, edema.  GASTROINTESTINAL:  noNausea, no vomiting.  Complains of midepigastric pain.  GENITOURINARY: No dysuria, hematuria.  ENDOCRINE: No polyuria, nocturia,  HEMATOLOGY: No anemia, easy bruising or bleeding SKIN: No rash or lesion. MUSCULOSKELETAL: No joint pain or arthritis.   NEUROLOGIC: No tingling, numbness, weakness.  PSYCHIATRY: No anxiety or depression.   DRUG ALLERGIES:   Allergies  Allergen Reactions  . Donnatal [Pb-Hyoscy-Atropine-Scopolamine] Anaphylaxis    Reaction to GI cocktail  . Fish Allergy Anaphylaxis, Hives and  Rash  . Haldol [Haloperidol Lactate] Other (See Comments)    Chest Pain  . Lidocaine Anaphylaxis    Reaction to GI cocktail  . Maalox [Calcium Carbonate Antacid] Anaphylaxis    Reaction to GI cocktail  . Lisinopril Swelling  . Marinol [Dronabinol]     Caused worsening vomiting.   . Aspirin Hives and Itching  . Bactrim [Sulfamethoxazole-Trimethoprim] Itching  . Doxycycline Hives and Nausea And Vomiting    Severe   . Keflex [Cephalexin] Hives, Itching and Nausea And Vomiting  . Penicillins Hives and Itching    Has patient had a PCN reaction causing immediate rash, facial/tongue/throat swelling, SOB or lightheadedness with hypotension: yes Has patient had a PCN reaction causing severe rash involving mucus membranes or skin necrosis: yes Has patient had a PCN reaction that required hospitalization no Has patient had a PCN reaction occurring within the last 10 years: yes If all of the above answers are "NO", then may proceed with Cephalospor    VITALS:  Blood pressure (!) 181/85, pulse 70, temperature 99.2 F (37.3 C), temperature source Oral, resp. rate (!) 22, weight (!) 167.8 kg, SpO2 100 %.  PHYSICAL EXAMINATION:  GENERAL:  47 y.o.-year-old patient lying in the bed with no acute distress.  EYES: Pupils equal, round, reactive to light and accommodation. No scleral icterus. Extraocular muscles intact.  HEENT: Head atraumatic, normocephalic. Oropharynx and nasopharynx clear.  NECK:  Supple, no jugular venous distention. No thyroid enlargement, no tenderness.  LUNGS: Normal breath sounds bilaterally, no wheezing, rales,rhonchi or crepitation. No use  of accessory muscles of respiration.  CARDIOVASCULAR: S1, S2 normal. No murmurs, rubs, or gallops.  ABDOMEN: Soft, nontender, nondistended. Bowel sounds present. No organomegaly or mass.  EXTREMITIES: No pedal edema, cyanosis, or clubbing.  NEUROLOGIC: Cranial nerves II through XII are intact. Muscle strength 5/5 in all extremities.  Sensation intact. Gait not checked.  PSYCHIATRIC: The patient is alert and oriented x 3.  SKIN: No obvious rash, lesion, or ulcer.    LABORATORY PANEL:   CBC Recent Labs  Lab 06/14/18 0211  WBC 8.2  HGB 7.9*  HCT 23.5*  PLT 163   ------------------------------------------------------------------------------------------------------------------  Chemistries  Recent Labs  Lab 06/13/18 1225  06/14/18 0211  NA 140  --  142  K 3.2*  --  2.6*  CL 103  --  106  CO2 26  --  27  GLUCOSE 127*  --  124*  BUN 27*  --  28*  CREATININE 3.21*   < > 3.07*  CALCIUM 8.2*  --  7.8*  AST 38  --   --   ALT 15  --   --   ALKPHOS 56  --   --   BILITOT 1.0  --   --    < > = values in this interval not displayed.   ------------------------------------------------------------------------------------------------------------------  Cardiac Enzymes Recent Labs  Lab 06/14/18 0211  TROPONINI 0.10*   ------------------------------------------------------------------------------------------------------------------  RADIOLOGY:  Ct Abdomen Pelvis Wo Contrast  Result Date: 06/13/2018 CLINICAL DATA:  Abdominal pain and vomiting. EXAM: CT ABDOMEN AND PELVIS WITHOUT CONTRAST TECHNIQUE: Multidetector CT imaging of the abdomen and pelvis was performed following the standard protocol without IV contrast. COMPARISON:  06/09/2018 FINDINGS: Lower chest: Small pericardial effusion with hemorrhage thickness of 10 mm. No airspace consolidation. Right gynecomastia. Hepatobiliary: Hepatic steatosis. Probable layering gallbladder sludge. Pancreas: Unremarkable. No pancreatic ductal dilatation or surrounding inflammatory changes. Spleen: Normal in size without focal abnormality. Adrenals/Urinary Tract: Normal adrenal glands. Normal appearance of the kidneys and ureters. The urinary bladder is decompressed around urinary Foley. Likely iatrogenic gas within the urinary bladder. Stomach/Bowel: Stomach is within normal  limits. The appendix is upper limits of normal by thickness measuring 9 mm. No periappendiceal inflammatory changes or appendicoliths are seen. No evidence of bowel wall thickening, distention, or inflammatory changes. Vascular/Lymphatic: No significant vascular findings are present. No enlarged abdominal or pelvic lymph nodes. Reproductive: Prostate is unremarkable. Other: Fat containing right inguinal hernia. No abdominopelvic ascites. Mild mesenteric stranding. Diffuse subcutaneous edema. Musculoskeletal: Lumbosacral spine spondylosis. IMPRESSION: Small pericardial effusion. Hepatic steatosis. Gallbladder sludge. Borderline thickening of the appendix, likely incidental, as there are no secondary signs of appendicitis. Diffuse mesenteric stranding and anasarca of the abdominal wall. Electronically Signed   By: Fidela Salisbury M.D.   On: 06/13/2018 15:06   Dg Chest 1 View  Result Date: 06/13/2018 CLINICAL DATA:  Chest pain. EXAM: CHEST  1 VIEW COMPARISON:  06/11/2018 FINDINGS: Enlarged cardiac silhouette.  Mediastinal contours appear intact. There is no evidence of focal airspace consolidation, pleural effusion or pneumothorax. Osseous structures are without acute abnormality. Soft tissues are grossly normal. IMPRESSION: Enlarged cardiac silhouette. No significant pulmonary edema. Electronically Signed   By: Fidela Salisbury M.D.   On: 06/13/2018 14:52   Dg Clavicle Right  Result Date: 06/13/2018 CLINICAL DATA:  Status post fall on the right shoulder. EXAM: RIGHT CLAVICLE - 2+ VIEWS COMPARISON:  None. FINDINGS: There is a comminuted displaced fracture of the distal right clavicle with overriding of the fracture fragments and superior dislocation of the  distal fracture fragment. There is a butterfly fragment inferior to the clavicle. The acromioclavicular joint appears intact. Overlying soft tissue swelling. IMPRESSION: Comminuted displaced impacted fracture of the distal right clavicle.  Electronically Signed   By: Fidela Salisbury M.D.   On: 06/13/2018 14:54   Mr Brain Wo Contrast  Result Date: 06/13/2018 CLINICAL DATA:  Altered level of consciousness. Left-sided weakness. EXAM: MRI HEAD WITHOUT CONTRAST TECHNIQUE: Multiplanar, multiecho pulse sequences of the brain and surrounding structures were obtained without intravenous contrast. COMPARISON:  CT head 06/13/2018 FINDINGS: Brain: Negative for acute infarct. Scattered small white matter hyperintensities bilaterally, mild in degree. Negative for hemorrhage or mass. Ventricle size normal. Vascular: Normal arterial flow void Skull and upper cervical spine: Negative Sinuses/Orbits: Paranasal sinuses clear. Bilateral cataract surgery. Cyst within the left superolateral orbit appears to be an implant. Other: None IMPRESSION: Negative for acute infarct. Mild chronic white matter changes most likely due to microvascular ischemia. Electronically Signed   By: Franchot Gallo M.D.   On: 06/13/2018 18:56   Ct Head Code Stroke Wo Contrast  Result Date: 06/13/2018 CLINICAL DATA:  Code stroke. Acute onset of left-sided facial droop beginning at 11:30 a.m. today. Is EXAM: CT HEAD WITHOUT CONTRAST TECHNIQUE: Contiguous axial images were obtained from the base of the skull through the vertex without intravenous contrast. COMPARISON:  CT head without contrast 05/13/2018. FINDINGS: Brain: No acute infarct, hemorrhage, or mass lesion is present. The ventricles are of normal size. No significant extraaxial fluid collection is present. Significant white matter disease is present. The brainstem and cerebellum are normal. Vascular: No hyperdense vessel or unexpected calcification. Skull: Calvarium is intact. No focal lytic or blastic lesions are present. Focal hyperdensity in the right parietal scalp is stable, chronic scarring. Sinuses/Orbits: The paranasal sinuses and mastoid air cells are clear. Globes and orbits are within normal limits. Left lacrimal  gland stent is again noted. ASPECTS [facility] Stroke Program Early CT Score) - Ganglionic level infarction (caudate, lentiform nuclei, internal capsule, insula, M1-M3 cortex): 7/7 - Supraganglionic infarction (M4-M6 cortex): 3/3 Total score (0-10 with 10 being normal): 10/10 IMPRESSION: 1. Normal CT appearance of brain 2. Stable scarring in the right parietal scalp. 3. Left lacrimal gland stents 4. ASPECTS is 10/10 These results were called by telephone at the time of interpretation on 06/13/2018 at 12:34 pm to Dr. Larae Grooms , who verbally acknowledged these results. Electronically Signed   By: San Morelle M.D.   On: 06/13/2018 12:36    EKG:   Orders placed or performed during the hospital encounter of 06/13/18  . EKG 12-Lead  . EKG 12-Lead  . ED EKG  . ED EKG    ASSESSMENT AND PLAN:  47 year old male patient with history of essential hypertension, diabetes mellitus type 2, recent history of right clavicle fracture after syncope comes in with confusion, abdominal pain yesterday found to have severely elevated blood pressure. 1.  Acute metabolic encephalopathy likely secondary to severely elevated blood pressure in the emergency room and also dehydration with acute renal failure: Patient is very alert and awake, oriented, MRI of the brain did not show any strokes.  Discontinue the sitter. 2.  Hypertensive emergency, ; BP still high but better than yesterday.  Is on clonidine patch 0.2 mg every 24 hours,, increase hydralazine 100 mg 3 times daily. Continue Coreg 25 mg p.o. twice daily. 3.  Acute on chronic renal failure, CKD stage III: Avoid nephrotoxic agents, hold lisinopril, torsemide, consult nephrology.  Received IV fluids but left arm  is infiltrated, check ultrasound of left arm, continue IV fluids at this time.  Patient's creatinine is still elevated to more than 3, baseline creatinine is around 1.43 by labs in April 2019. 4.  Diabetes mellitus type 2: Patient takes NPH insulin  70/30 25 units twice daily, which is reordered, Januvia is held at this time.  5. abdominal pain secondary to constipation. 5.  Slightly elevated troponins likely demand ischemia and also renal failure induced.  6.  Severe hypokalemia: Received potassium yesterday, start p.o. supplements.  All the records are reviewed and case discussed with Care Management/Social Workerr. Management plans discussed with the patient, family and they are in agreement.  CODE STATUS: Full code  TOTAL TIME TAKING CARE OF THIS PATIENT: 38 minutes.   POSSIBLE D/C IN 1-2 DAYS, DEPENDING ON CLINICAL CONDITION. More than 50% time spent in counseling, coordination of care  Epifanio Lesches M.D on 06/14/2018 at 11:55 AM  Between 7am to 6pm - Pager - [phone]  After 6pm go to [email or web address] - password [email or web address] [facility]  Office  [phone]  CC: Primary care physician; [name], MD   Note: This dictation was prepared with Dragon dictation along with smaller phrase technology. Any transcriptional errors that result from this process are unintentional.

## 2018-06-14 NOTE — Plan of Care (Signed)
  Problem: Clinical Measurements: Goal: Ability to maintain clinical measurements within normal limits will improve Outcome: Progressing Patient abel to maintain O2 sats 99-100% on room air   Goal: Respiratory complications will improve Outcome: Progressing

## 2018-06-15 LAB — TSH: TSH: 5.902 u[IU]/mL — ABNORMAL HIGH (ref 0.350–4.500)

## 2018-06-15 LAB — BASIC METABOLIC PANEL
Anion gap: 5 (ref 5–15)
BUN: 23 mg/dL — ABNORMAL HIGH (ref 6–20)
CO2: 28 mmol/L (ref 22–32)
Calcium: 7.5 mg/dL — ABNORMAL LOW (ref 8.9–10.3)
Chloride: 105 mmol/L (ref 98–111)
Creatinine, Ser: 2.97 mg/dL — ABNORMAL HIGH (ref 0.61–1.24)
GFR calc Af Amer: 27 mL/min — ABNORMAL LOW (ref >60)
GFR calc non Af Amer: 23 mL/min — ABNORMAL LOW (ref >60)
Glucose, Bld: 131 mg/dL — ABNORMAL HIGH (ref 70–99)
Potassium: 2.9 mmol/L — ABNORMAL LOW (ref 3.5–5.1)
Sodium: 138 mmol/L (ref 135–145)

## 2018-06-15 LAB — GLUCOSE, CAPILLARY
Glucose-Capillary: 144 mg/dL — ABNORMAL HIGH (ref 70–99)
Glucose-Capillary: 134 mg/dL — ABNORMAL HIGH (ref 70–99)
Glucose-Capillary: 196 mg/dL — ABNORMAL HIGH (ref 70–99)
Glucose-Capillary: 160 mg/dL — ABNORMAL HIGH (ref 70–99)

## 2018-06-15 LAB — PROTEIN / CREATININE RATIO, URINE
Creatinine, Urine: 109 mg/dL
Protein Creatinine Ratio: 4.92 mg/mg{Cre} — ABNORMAL HIGH (ref 0.00–0.15)
Total Protein, Urine: 536 mg/dL

## 2018-06-15 LAB — MAGNESIUM: Magnesium: 2 mg/dL (ref 1.7–2.4)

## 2018-06-15 MED ORDER — POTASSIUM CHLORIDE CRYS ER 20 MEQ PO TBCR
40.0000 meq | EXTENDED_RELEASE_TABLET | Freq: Once | ORAL | Status: AC
  Administered 2018-06-15: 40 meq via ORAL
  Filled 2018-06-15: qty 2

## 2018-06-15 MED ORDER — AMILORIDE HCL 5 MG PO TABS
5.0000 mg | ORAL_TABLET | Freq: Every day | ORAL | Status: DC
  Administered 2018-06-15 – 2018-06-16 (×2): 5 mg via ORAL
  Filled 2018-06-15 (×2): qty 1

## 2018-06-15 MED ORDER — LOSARTAN POTASSIUM 25 MG PO TABS
25.0000 mg | ORAL_TABLET | Freq: Every day | ORAL | Status: DC
  Administered 2018-06-15 – 2018-06-16 (×2): 25 mg via ORAL
  Filled 2018-06-15 (×2): qty 1

## 2018-06-15 NOTE — Evaluation (Signed)
Physical Therapy Evaluation Patient Details Name: Timothy Clark MRN: [id number] DOB: 1971-07-07 Today's Date: 06/15/2018   History of Present Illness  Patient is a 47 year old male admitted for stroke workup with c/o L facial droop and weakness.  He has a recent clavicular fracture and R arm is in a sling.  PMH includes DMII, Htn, glaucoma and being legally blind in R eye.  Clinical Impression  Pt is a 47 year old male who lives in a one story home with his father and sister.  He is independent without use of AD at baseline, though he does require use of furniture for guidance due to being legally blind in R eye.  Pt able to perform bed mobility mod I.  His R UE is currently immobilized in a sling due to a clavicular fracture.  Pt demonstrated good strength in all other extremities and reported no sensation loss.  He required two attempts to stand from bedside but PT did not assist pt in rising.  PT provided education regarding benefit of hemi walker and pt was able to walk to chair with unilateral UE support.  Pt receptive to education and reported no pain throughout evaluation.  Pt will continue to benefit from skilled PT with focus on safe functional mobility, tolerance to activity and strength.    Follow Up Recommendations Home health PT;Supervision for mobility/OOB    Equipment Recommendations  (Pt will benefit from a hemi walker due to status of R UE.)    Recommendations for Other Services       Precautions / Restrictions Precautions Precautions: Fall Required Braces or Orthoses: Sling(R arm in sling.) Restrictions Weight Bearing Restrictions: No      Mobility  Bed Mobility Overal bed mobility: Modified Independent             General bed mobility comments: Increased time  Transfers Overall transfer level: Needs assistance Equipment used: Hemi-walker Transfers: Sit to/from Stand Sit to Stand: Min guard         General transfer comment: Pt able to rise on his  own but required two attempts; stated that he felt he was somewhat weakened as he had not been out of bed since Sunday.  Ambulation/Gait Ambulation/Gait assistance: Min guard Gait Distance (Feet): 5 Feet Assistive device: Hemi-walker     Gait velocity interpretation: <1.8 ft/sec, indicate of risk for recurrent falls General Gait Details: Able to walk to chair with unilateral UE support following education from PT. Pt cued to avoid wider BOS.  Stairs            Wheelchair Mobility    Modified Rankin (Stroke Patients Only)       Balance Overall balance assessment: Modified Independent                                           Pertinent Vitals/Pain Pain Assessment: No/denies pain    Home Living Family/patient expects to be discharged to:: Private residence Living Arrangements: Children;Parent Available Help at Discharge: Family;Available 24 hours/day Type of Home: House Home Access: Stairs to enter Entrance Stairs-Rails: Right Entrance Stairs-Number of Steps: 2 Home Layout: One level Home Equipment: Grab bars - toilet;Grab bars - tub/shower      Prior Function Level of Independence: Independent         Comments: Pt does not use an AD and states that he holds his hand out  to avoid running into furniture.     Hand Dominance   Dominant Hand: Right    Extremity/Trunk Assessment   Upper Extremity Assessment Upper Extremity Assessment: Overall WFL for tasks assessed;RUE deficits/detail RUE Deficits / Details: Grip strength 5/5 RUE: Unable to fully assess due to immobilization    Lower Extremity Assessment Lower Extremity Assessment: Overall WFL for tasks assessed(Grossly 5/5 with no sensation loss noted.  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      General Comments      Exercises     Assessment/Plan    PT Assessment Patient needs continued PT services  PT Problem List Decreased mobility;Decreased balance;Decreased knowledge of use of DME;Decreased activity tolerance       PT Treatment Interventions DME instruction;Therapeutic activities;Gait training;Therapeutic exercise;Patient/family education;Stair training;Balance training;Functional mobility training;Neuromuscular re-education    PT Goals (Current goals can be found in the Care Plan section)  Acute Rehab PT Goals Patient Stated Goal: To return to general daily activity without the use of an AD. PT Goal Formulation: With patient Time For Goal Achievement: 06/15/18 Potential to Achieve Goals: Good    Frequency Min 2X/week   Barriers to discharge        Co-evaluation               AM-PAC PT "6 Clicks" Daily Activity  Outcome Measure Difficulty turning over in bed (including adjusting bedclothes, sheets and blankets)?: A Little Difficulty moving from lying on back to sitting on the side of the bed? : A Little Difficulty sitting down on and standing up from a chair with arms (e.g., wheelchair, bedside commode, etc,.)?: A Lot Help needed moving to and from a bed to chair (including a wheelchair)?: A Little Help needed walking in hospital room?: A Lot Help needed climbing 3-5 steps with a railing? : A Lot 6 Click Score: 15    End of Session Equipment Utilized During Treatment: Gait belt Activity Tolerance: Patient tolerated treatment well Patient left: in chair;with chair alarm set;with call bell/phone within reach   PT Visit Diagnosis: Unsteadiness on feet (R26.81);History of falling  (Z91.81)    Time: 2979-8921 PT Time Calculation (min) (ACUTE ONLY): 22 min   Charges:   PT Evaluation $PT Eval Low Complexity: 1 Low PT Treatments $Therapeutic Activity: 8-22 mins        Roxanne Gates, PT, DPT  Roxanne Gates 06/15/2018, 5:45 PM

## 2018-06-15 NOTE — Plan of Care (Signed)
  Problem: Clinical Measurements: Goal: Ability to maintain clinical measurements within normal limits will improve Outcome: Progressing   Problem: Activity: Goal: Risk for activity intolerance will decrease Outcome: Progressing   

## 2018-06-15 NOTE — Plan of Care (Signed)
Patient is alert and oriented, answering question appropriately. Patient is able to feed himself. Patient needs to be encouraged to get up to chair for meals and ambulation.

## 2018-06-15 NOTE — Progress Notes (Signed)
[facility] [place] NAME: Timothy Clark    MR#:  [id number]  DATE OF BIRTH:  07-11-1971  SUBJECTIVE:   No acute events overnight, mental status much improved and is at baseline and following commands.  Blood pressure has improved.  Mildly hypokalemic today, patient denies any other acute complaints.  Awaiting physical therapy evaluation.  REVIEW OF SYSTEMS:    Review of Systems  Constitutional: Negative for chills and fever.  HENT: Negative for congestion and tinnitus.   Eyes: Negative for blurred vision and double vision.  Respiratory: Negative for cough, shortness of breath and wheezing.   Cardiovascular: Negative for chest pain, orthopnea and PND.  Gastrointestinal: Negative for abdominal pain, diarrhea, nausea and vomiting.  Genitourinary: Negative for dysuria and hematuria.  Neurological: Positive for weakness (generalized). Negative for dizziness, sensory change and focal weakness.  All other systems reviewed and are negative.   Nutrition: Heart healthy/Carb control Tolerating Diet: Yes Tolerating PT: Await Eval.   DRUG ALLERGIES:   Allergies  Allergen Reactions  . Donnatal [Pb-Hyoscy-Atropine-Scopolamine] Anaphylaxis    Reaction to GI cocktail  . Fish Allergy Anaphylaxis, Hives and Rash  . Haldol [Haloperidol Lactate] Other (See Comments)    Chest Pain  . Lidocaine Anaphylaxis    Reaction to GI cocktail  . Maalox [Calcium Carbonate Antacid] Anaphylaxis    Reaction to GI cocktail  . Marinol [Dronabinol]     Caused worsening vomiting.   . Aspirin Hives and Itching  . Bactrim [Sulfamethoxazole-Trimethoprim] Itching  . Doxycycline Hives and Nausea And Vomiting    Severe   . Keflex [Cephalexin] Hives, Itching and Nausea And Vomiting  . Penicillins Hives and Itching    Has patient had a PCN reaction causing immediate rash, facial/tongue/throat swelling, SOB or lightheadedness with hypotension: yes Has patient had a  PCN reaction causing severe rash involving mucus membranes or skin necrosis: yes Has patient had a PCN reaction that required hospitalization no Has patient had a PCN reaction occurring within the last 10 years: yes If all of the above answers are "NO", then may proceed with Cephalospor    VITALS:  Blood pressure (!) 157/83, pulse 64, temperature 98.2 F (36.8 C), temperature source Oral, resp. rate 16, weight (!) 139.3 kg, SpO2 100 %.  PHYSICAL EXAMINATION:   Physical Exam  GENERAL:  47 y.o.-year-old obese patient lying in bed in no acute distress.  EYES: Pupils equal, round, reactive to light and accommodation. No scleral icterus. Extraocular muscles intact.  HEENT: Head atraumatic, normocephalic. Oropharynx and nasopharynx clear.  NECK:  Supple, no jugular venous distention. No thyroid enlargement, no tenderness.  LUNGS: Normal breath sounds bilaterally, no wheezing, rales, rhonchi. No use of accessory muscles of respiration.  CARDIOVASCULAR: S1, S2 normal. No murmurs, rubs, or gallops.  ABDOMEN: Soft, nontender, nondistended. Bowel sounds present. No organomegaly or mass.  EXTREMITIES: No cyanosis, clubbing or edema b/l.  Right shoulder in sling due to clavicular fracture.  NEUROLOGIC: Cranial nerves II through XII are intact. No focal Motor or sensory deficits b/l.   PSYCHIATRIC: The patient is alert and oriented x 3.  SKIN: No obvious rash, lesion, or ulcer.    LABORATORY PANEL:   CBC Recent Labs  Lab 06/14/18 0211  WBC 8.2  HGB 7.9*  HCT 23.5*  PLT 163   ------------------------------------------------------------------------------------------------------------------  Chemistries  Recent Labs  Lab 06/13/18 1225  06/15/18 0058  NA 140   < > 138  K 3.2*   < >  2.9*  CL 103   < > 105  CO2 26   < > 28  GLUCOSE 127*   < > 131*  BUN 27*   < > 23*  CREATININE 3.21*   < > 2.97*  CALCIUM 8.2*   < > 7.5*  MG  --   --  2.0  AST 38  --   --   ALT 15  --   --     ALKPHOS 56  --   --   BILITOT 1.0  --   --    < > = values in this interval not displayed.   ------------------------------------------------------------------------------------------------------------------  Cardiac Enzymes Recent Labs  Lab 06/14/18 0211  TROPONINI 0.10*   ------------------------------------------------------------------------------------------------------------------  RADIOLOGY:  Mr Brain Wo Contrast  Result Date: 06/13/2018 CLINICAL DATA:  Altered level of consciousness. Left-sided weakness. EXAM: MRI HEAD WITHOUT CONTRAST TECHNIQUE: Multiplanar, multiecho pulse sequences of the brain and surrounding structures were obtained without intravenous contrast. COMPARISON:  CT head 06/13/2018 FINDINGS: Brain: Negative for acute infarct. Scattered small white matter hyperintensities bilaterally, mild in degree. Negative for hemorrhage or mass. Ventricle size normal. Vascular: Normal arterial flow void Skull and upper cervical spine: Negative Sinuses/Orbits: Paranasal sinuses clear. Bilateral cataract surgery. Cyst within the left superolateral orbit appears to be an implant. Other: None IMPRESSION: Negative for acute infarct. Mild chronic white matter changes most likely due to microvascular ischemia. Electronically Signed   By: Franchot Gallo M.D.   On: 06/13/2018 18:56   US Venous Img Upper Uni Left  Result Date: 06/14/2018 CLINICAL DATA:  Left arm swelling.  IV site infiltration. EXAM: LEFT UPPER EXTREMITY VENOUS DOPPLER ULTRASOUND TECHNIQUE: Gray-scale sonography with graded compression, as well as color Doppler and duplex ultrasound were performed to evaluate the upper extremity deep venous system from the level of the subclavian vein and including the jugular, axillary, basilic, radial, ulnar and upper cephalic vein. Spectral Doppler was utilized to evaluate flow at rest and with distal augmentation maneuvers. COMPARISON:  None. FINDINGS: Contralateral Subclavian Vein:  Respiratory phasicity is normal and symmetric with the symptomatic side. No evidence of thrombus. Internal Jugular Vein: No evidence of thrombus. Normal compressibility, respiratory phasicity and response to augmentation. Subclavian Vein: No evidence of thrombus. Normal compressibility, respiratory phasicity and response to augmentation. Axillary Vein: No evidence of thrombus. Normal compressibility, respiratory phasicity and response to augmentation. Cephalic Vein: No evidence of thrombus. Normal compressibility, respiratory phasicity and response to augmentation. Basilic Vein: No evidence of thrombus. Normal compressibility, respiratory phasicity and response to augmentation. Brachial Veins: No evidence of thrombus. Normal compressibility, respiratory phasicity and response to augmentation. Radial Veins: No evidence of thrombus. Normal compressibility and color Doppler flow. Ulnar Veins: No evidence of thrombus. Normal compressibility and color Doppler flow. Venous Reflux:  None visualized. Other Findings:  Subcutaneous edema at the prior IV site. IMPRESSION: No evidence of DVT within the left upper extremity. Electronically Signed   By: Markus Daft M.D.   On: 06/14/2018 15:44   [place] Ekg Site Rite  Result Date: 06/14/2018 If Site Rite image not attached, placement could not be confirmed due to current cardiac rhythm.    ASSESSMENT AND PLAN:   47 year old male with past medical history of diabetes, hypertension, GERD, gastroparesis, history of esophagitis who presented to the hospital due to accelerated hypertension and altered mental status.  1.  Altered mental status-secondary to metabolic encephalopathy secondary to accelerated hypertension. - Patient underwent MRI of the brain which was negative for acute CVA.  Mental  status is much improved and back to baseline as patient's blood pressures have improved.  2.  Accelerated hypertension/hypertensive urgency- much improved. -Continue clonidine patch,  hydralazine, carvedilol, losartan. -Continue IV labetalol hydralazine as needed.  3.  Status post recent fall with right clavicular fracture-continue supportive care with pain control, await physical therapy evaluation, patient has outpatient follow-up with orthopedics.  3.  Acute on chronic renal failure stage 3-baseline creatinine 1.9 and currently elevated to 2.9. -Seen by nephrology and current acute kidney injury secondary to hemodynamic instability hypertensive urgency versus progression of his underlying CKD. - Continue blood pressure control, appreciate nephrology input, no acute need for hemodialysis.  4.  Hypokalemia- continue oral potassium supplements, amiloride started by nephrology. -Pending aldosterone renin ratio and magnesium level.  5.  Diabetes type 2 with CKD stage III- continue sliding scale insulin, follow blood sugars.  6.  Glaucoma-continue Alphagan, Timolol eyedrops.  7.  Hyperlipidemia-continue atorvastatin.   All the records are reviewed and case discussed with Care Management/Social Worker. Management plans discussed with the patient, family and they are in agreement.  CODE STATUS: Full code  DVT Prophylaxis: Hep SQ  TOTAL TIME TAKING CARE OF THIS PATIENT: 30 minutes.   POSSIBLE D/C IN 2-3 DAYS, DEPENDING ON CLINICAL CONDITION.   Henreitta Leber M.D on 06/15/2018 at 3:52 PM  Between 7am to 6pm - Pager - [phone]  After 6pm go to [email or web address] - Proofreader  [facility]  Office  [phone]  CC: Primary care physician; [name], MD

## 2018-06-15 NOTE — Progress Notes (Signed)
   06/15/18 1400  Clinical Encounter Type  Visited With Patient and family together  Visit Type Follow-up  Referral From Nurse  Spiritual Encounters  Spiritual Needs Emotional  Stress Factors  Patient Stress Factors Family relationships;Other (Comment)  Advance Directives (For Healthcare)  Does Patient Have a Medical Advance Directive? No  Would patient like information on creating a medical advance directive? Yes (Inpatient - patient requests chaplain consult to create a medical advance directive)  [place] Directives  Does Patient Have a Mental Health Advance Directive? No  Would patient like information on creating a mental health advance directive? No - Patient declined

## 2018-06-15 NOTE — Progress Notes (Addendum)
[facility], [place] 06/15/18  Subjective:   No new c/o BP remains high Pharmacist clarified with patient - no true allergy to ACE-I; was taken off because of worsening Creatinine No acute SOB Still has some edema Able to eat without nausea and vomiting  Objective:  Vital signs in last 24 hours:  Temp:  [98.1 F (36.7 C)-99.7 F (37.6 C)] 98.2 F (36.8 C) (11/05 0806) Pulse Rate:  [64-70] 69 (11/05 0806) Resp:  [16] 16 (11/05 0806) BP: (145-196)/(80-101) 194/98 (11/05 0806) SpO2:  [99 %-100 %] 100 % (11/05 0806) Weight:  [139.3 kg] 139.3 kg (11/05 0410)  Weight change: -28.5 kg Filed Weights   06/13/18 1234 06/15/18 0410  Weight: (!) 167.8 kg (!) 139.3 kg    Intake/Output:    Intake/Output Summary (Last 24 hours) at 06/15/2018 0955 Last data filed at 06/15/2018 0858 Gross per 24 hour  Intake 480 ml  Output 1875 ml  Net -1395 ml    Physical Exam: General:  No acute distress, laying in the bed  HEENT  blind in right eye, left eye poor vision  Neck:  Supple, no distended neck veins  Lungs:  Normal breathing effort, clear to auscultation  Heart::  Regular, no rub  Abdomen:  Soft, nontender, obese  Extremities:  1-2+ edema over legs, left arm edema  Neurologic:  Alert, oriented, able to answer questions appropriately  Skin:  Dry skin    Basic Metabolic Panel:  Recent Labs  Lab 06/09/18 1146 06/13/18 1225 06/13/18 2036 06/14/18 0211 06/15/18 0058  NA 139 140  --  142 138  K 3.3* 3.2*  --  2.6* 2.9*  CL 103 103  --  106 105  CO2 25 26  --  27 28  GLUCOSE 206* 127*  --  124* 131*  BUN 23* 27*  --  28* 23*  CREATININE 3.20* 3.21* 3.13* 3.07* 2.97*  CALCIUM 8.2* 8.2*  --  7.8* 7.5*  MG  --   --   --   --  2.0     CBC: Recent Labs  Lab 06/09/18 1146 06/13/18 1225 06/14/18 0211  WBC 7.4 10.5 8.2  NEUTROABS  --  8.1*  --   HGB 8.0* 8.7* 7.9*  HCT 23.8* 25.7* 23.5*  MCV 93.0 93.5 93.3  PLT 148* 195 163     No results  found for: HEPBSAG, HEPBSAB, HEPBIGM    Microbiology:  No results found for this or any previous visit (from the past 240 hour(s)).  Coagulation Studies: Recent Labs    06/13/18 1225  LABPROT 13.6  INR 1.05    Urinalysis: Recent Labs    06/13/18 1316  COLORURINE YELLOW*  LABSPEC 1.019  PHURINE 6.0  GLUCOSEU 150*  HGBUR SMALL*  BILIRUBINUR NEGATIVE  KETONESUR 5*  PROTEINUR >=300*  NITRITE NEGATIVE  LEUKOCYTESUR NEGATIVE      Imaging: Ct Abdomen Pelvis Wo Contrast  Result Date: 06/13/2018 CLINICAL DATA:  Abdominal pain and vomiting. EXAM: CT ABDOMEN AND PELVIS WITHOUT CONTRAST TECHNIQUE: Multidetector CT imaging of the abdomen and pelvis was performed following the standard protocol without IV contrast. COMPARISON:  06/09/2018 FINDINGS: Lower chest: Small pericardial effusion with hemorrhage thickness of 10 mm. No airspace consolidation. Right gynecomastia. Hepatobiliary: Hepatic steatosis. Probable layering gallbladder sludge. Pancreas: Unremarkable. No pancreatic ductal dilatation or surrounding inflammatory changes. Spleen: Normal in size without focal abnormality. Adrenals/Urinary Tract: Normal adrenal glands. Normal appearance of the kidneys and ureters. The urinary bladder is decompressed around urinary Foley. Likely  iatrogenic gas within the urinary bladder. Stomach/Bowel: Stomach is within normal limits. The appendix is upper limits of normal by thickness measuring 9 mm. No periappendiceal inflammatory changes or appendicoliths are seen. No evidence of bowel wall thickening, distention, or inflammatory changes. Vascular/Lymphatic: No significant vascular findings are present. No enlarged abdominal or pelvic lymph nodes. Reproductive: Prostate is unremarkable. Other: Fat containing right inguinal hernia. No abdominopelvic ascites. Mild mesenteric stranding. Diffuse subcutaneous edema. Musculoskeletal: Lumbosacral spine spondylosis. IMPRESSION: Small pericardial effusion.  Hepatic steatosis. Gallbladder sludge. Borderline thickening of the appendix, likely incidental, as there are no secondary signs of appendicitis. Diffuse mesenteric stranding and anasarca of the abdominal wall. Electronically Signed   By: Fidela Salisbury M.D.   On: 06/13/2018 15:06   Dg Chest 1 View  Result Date: 06/13/2018 CLINICAL DATA:  Chest pain. EXAM: CHEST  1 VIEW COMPARISON:  06/11/2018 FINDINGS: Enlarged cardiac silhouette.  Mediastinal contours appear intact. There is no evidence of focal airspace consolidation, pleural effusion or pneumothorax. Osseous structures are without acute abnormality. Soft tissues are grossly normal. IMPRESSION: Enlarged cardiac silhouette. No significant pulmonary edema. Electronically Signed   By: Fidela Salisbury M.D.   On: 06/13/2018 14:52   Dg Clavicle Right  Result Date: 06/13/2018 CLINICAL DATA:  Status post fall on the right shoulder. EXAM: RIGHT CLAVICLE - 2+ VIEWS COMPARISON:  None. FINDINGS: There is a comminuted displaced fracture of the distal right clavicle with overriding of the fracture fragments and superior dislocation of the distal fracture fragment. There is a butterfly fragment inferior to the clavicle. The acromioclavicular joint appears intact. Overlying soft tissue swelling. IMPRESSION: Comminuted displaced impacted fracture of the distal right clavicle. Electronically Signed   By: Fidela Salisbury M.D.   On: 06/13/2018 14:54   Mr Brain Wo Contrast  Result Date: 06/13/2018 CLINICAL DATA:  Altered level of consciousness. Left-sided weakness. EXAM: MRI HEAD WITHOUT CONTRAST TECHNIQUE: Multiplanar, multiecho pulse sequences of the brain and surrounding structures were obtained without intravenous contrast. COMPARISON:  CT head 06/13/2018 FINDINGS: Brain: Negative for acute infarct. Scattered small white matter hyperintensities bilaterally, mild in degree. Negative for hemorrhage or mass. Ventricle size normal. Vascular: Normal arterial  flow void Skull and upper cervical spine: Negative Sinuses/Orbits: Paranasal sinuses clear. Bilateral cataract surgery. Cyst within the left superolateral orbit appears to be an implant. Other: None IMPRESSION: Negative for acute infarct. Mild chronic white matter changes most likely due to microvascular ischemia. Electronically Signed   By: Franchot Gallo M.D.   On: 06/13/2018 18:56   US Venous Img Upper Uni Left  Result Date: 06/14/2018 CLINICAL DATA:  Left arm swelling.  IV site infiltration. EXAM: LEFT UPPER EXTREMITY VENOUS DOPPLER ULTRASOUND TECHNIQUE: Gray-scale sonography with graded compression, as well as color Doppler and duplex ultrasound were performed to evaluate the upper extremity deep venous system from the level of the subclavian vein and including the jugular, axillary, basilic, radial, ulnar and upper cephalic vein. Spectral Doppler was utilized to evaluate flow at rest and with distal augmentation maneuvers. COMPARISON:  None. FINDINGS: Contralateral Subclavian Vein: Respiratory phasicity is normal and symmetric with the symptomatic side. No evidence of thrombus. Internal Jugular Vein: No evidence of thrombus. Normal compressibility, respiratory phasicity and response to augmentation. Subclavian Vein: No evidence of thrombus. Normal compressibility, respiratory phasicity and response to augmentation. Axillary Vein: No evidence of thrombus. Normal compressibility, respiratory phasicity and response to augmentation. Cephalic Vein: No evidence of thrombus. Normal compressibility, respiratory phasicity and response to augmentation. Basilic Vein: No evidence of thrombus. Normal  compressibility, respiratory phasicity and response to augmentation. Brachial Veins: No evidence of thrombus. Normal compressibility, respiratory phasicity and response to augmentation. Radial Veins: No evidence of thrombus. Normal compressibility and color Doppler flow. Ulnar Veins: No evidence of thrombus. Normal  compressibility and color Doppler flow. Venous Reflux:  None visualized. Other Findings:  Subcutaneous edema at the prior IV site. IMPRESSION: No evidence of DVT within the left upper extremity. Electronically Signed   By: Markus Daft M.D.   On: 06/14/2018 15:44   Ct Head Code Stroke Wo Contrast  Result Date: 06/13/2018 CLINICAL DATA:  Code stroke. Acute onset of left-sided facial droop beginning at 11:30 a.m. today. Is EXAM: CT HEAD WITHOUT CONTRAST TECHNIQUE: Contiguous axial images were obtained from the base of the skull through the vertex without intravenous contrast. COMPARISON:  CT head without contrast 05/13/2018. FINDINGS: Brain: No acute infarct, hemorrhage, or mass lesion is present. The ventricles are of normal size. No significant extraaxial fluid collection is present. Significant white matter disease is present. The brainstem and cerebellum are normal. Vascular: No hyperdense vessel or unexpected calcification. Skull: Calvarium is intact. No focal lytic or blastic lesions are present. Focal hyperdensity in the right parietal scalp is stable, chronic scarring. Sinuses/Orbits: The paranasal sinuses and mastoid air cells are clear. Globes and orbits are within normal limits. Left lacrimal gland stent is again noted. ASPECTS [facility] Stroke Program Early CT Score) - Ganglionic level infarction (caudate, lentiform nuclei, internal capsule, insula, M1-M3 cortex): 7/7 - Supraganglionic infarction (M4-M6 cortex): 3/3 Total score (0-10 with 10 being normal): 10/10 IMPRESSION: 1. Normal CT appearance of brain 2. Stable scarring in the right parietal scalp. 3. Left lacrimal gland stents 4. ASPECTS is 10/10 These results were called by telephone at the time of interpretation on 06/13/2018 at 12:34 pm to Dr. Larae Grooms , who verbally acknowledged these results. Electronically Signed   By: San Morelle M.D.   On: 06/13/2018 12:36   [place] Ekg Site Rite  Result Date: 06/14/2018 If Site Rite image not  attached, placement could not be confirmed due to current cardiac rhythm.    Medications:    . aMILoride  5 mg Oral Daily  . atorvastatin  40 mg Oral QHS  . brimonidine  1 drop Both Eyes TID  . carvedilol  25 mg Oral BID WC  . cloNIDine  0.3 mg Transdermal Weekly  . clopidogrel  75 mg Oral Daily  . dorzolamide  1 drop Both Eyes BID  . folic acid  1 mg Oral Daily  . heparin  5,000 Units Subcutaneous Q8H  . hydrALAZINE  100 mg Oral TID  . insulin aspart  0-5 Units Subcutaneous QHS  . insulin aspart  0-9 Units Subcutaneous TID WC  . insulin aspart protamine- aspart  25 Units Subcutaneous BID WC  . lactulose  10 g Oral q morning - 10a  . sodium chloride flush  3 mL Intravenous Q12H  . sucralfate  1 g Oral QID  . timolol  1 drop Both Eyes BID   acetaminophen **OR** acetaminophen, bisacodyl, hydrALAZINE, HYDROcodone-acetaminophen, labetalol, LORazepam, morphine injection, senna-docusate  Assessment/ Plan:  47 y.o. male with diabetes type 2 with severe complications of peripheral neuropathy, severe retinopathy, nephropathy, obesity, chronic kidney disease, recent right clavicular fracture was admitted on 06/13/2018 with generalized weakness, hypertensive urgency.   1.  Acute renal failure on chronic kidney disease stage 3 and proteinuria Baseline creatinine 1.91, GFR 47 from July 2019 Underlying chronic kidney disease is likely secondary to diabetic  nephropathy Acute kidney injury likely secondary to hemodynamic instability, hypertensive urgency versus progression of underlying chronic kidney disease -Continue to try to achieve better blood pressure control -urine protein to creatinine ratio is > 4 gm; serologies pending  2.  Diabetes type 2 with chronic kidney disease, insulin-dependent Diabetes for poorly controlled in the past according to patient.  Hemoglobin A1c 12.7% in 2011   Ref. Range 06/13/2018 20:36  Hemoglobin A1C Latest Ref Range: 4.8 - 5.6 % 6.4 (H)   3.   Hypertension with CKD  Poorly controlled but resistant hypertension Current regimen includes Coreg 25 mg twice a day Clonidine patch 0.3 mg  Hydralazine 100 mg 3 times a day - Patient was taking a low-dose of ACE inhibitor at home but listed as allergy in patient list - start low-dose losartan if possible  4.  Severe hypokalemia We will obtain aldosterone renin ratio and magnesium level Amiloride started KCl supplements  5.  Lower extremity edema We will consider small dose of torsemide in the near future if worse    LOS: 2 Idania Desouza Candiss Norse 11/5/20199:55 AM  [place], [place]  Note: This note was prepared with Dragon dictation. Any transcription errors are unintentional

## 2018-06-16 ENCOUNTER — Other Ambulatory Visit: Payer: Self-pay | Admitting: Pharmacy Technician

## 2018-06-16 LAB — PROTEIN ELECTROPHORESIS, SERUM
A/G Ratio: 0.8 (ref 0.7–1.7)
Albumin ELP: 1.9 g/dL — ABNORMAL LOW (ref 2.9–4.4)
Alpha-1-Globulin: 0.2 g/dL (ref 0.0–0.4)
Alpha-2-Globulin: 0.8 g/dL (ref 0.4–1.0)
Beta Globulin: 0.6 g/dL — ABNORMAL LOW (ref 0.7–1.3)
Gamma Globulin: 0.8 g/dL (ref 0.4–1.8)
Globulin, Total: 2.4 g/dL (ref 2.2–3.9)
Total Protein ELP: 4.3 g/dL — ABNORMAL LOW (ref 6.0–8.5)

## 2018-06-16 LAB — BASIC METABOLIC PANEL
Anion gap: 6 (ref 5–15)
BUN: 23 mg/dL — ABNORMAL HIGH (ref 6–20)
CO2: 26 mmol/L (ref 22–32)
Calcium: 7.8 mg/dL — ABNORMAL LOW (ref 8.9–10.3)
Chloride: 110 mmol/L (ref 98–111)
Creatinine, Ser: 2.66 mg/dL — ABNORMAL HIGH (ref 0.61–1.24)
GFR calc Af Amer: 31 mL/min — ABNORMAL LOW (ref >60)
GFR calc non Af Amer: 27 mL/min — ABNORMAL LOW (ref >60)
Glucose, Bld: 126 mg/dL — ABNORMAL HIGH (ref 70–99)
Potassium: 3.6 mmol/L (ref 3.5–5.1)
Sodium: 142 mmol/L (ref 135–145)

## 2018-06-16 LAB — GLUCOSE, CAPILLARY
Glucose-Capillary: 128 mg/dL — ABNORMAL HIGH (ref 70–99)
Glucose-Capillary: 140 mg/dL — ABNORMAL HIGH (ref 70–99)

## 2018-06-16 LAB — PROTEIN ELECTRO, RANDOM URINE
Albumin ELP, Urine: 55.1 %
Alpha-1-Globulin, U: 8.1 %
Alpha-2-Globulin, U: 10.4 %
Beta Globulin, U: 10.8 %
Gamma Globulin, U: 15.6 %
Total Protein, Urine: 560.2 mg/dL

## 2018-06-16 LAB — ANCA TITERS
Atypical P-ANCA titer: <1:20 {titer}
C-ANCA: <1:20 {titer}
P-ANCA: <1:20 {titer}

## 2018-06-16 LAB — HCV COMMENT:

## 2018-06-16 LAB — HEPATITIS B SURFACE ANTIBODY,QUALITATIVE: Hep B S Ab: NONREACTIVE

## 2018-06-16 LAB — ANA W/REFLEX IF POSITIVE: Anti Nuclear Antibody(ANA): NEGATIVE

## 2018-06-16 LAB — HEPATITIS C ANTIBODY (REFLEX): HCV Ab: <0.1 s/co ratio (ref 0.0–0.9)

## 2018-06-16 LAB — HEPATITIS B SURFACE ANTIGEN: Hepatitis B Surface Ag: NEGATIVE

## 2018-06-16 LAB — PARATHYROID HORMONE, INTACT (NO CA): PTH: 74 pg/mL — ABNORMAL HIGH (ref 15–65)

## 2018-06-16 MED ORDER — AMILORIDE HCL 5 MG PO TABS: 5.0000 mg | ORAL_TABLET | Freq: Every day | ORAL | 0 refills | Status: DC

## 2018-06-16 MED ORDER — TORSEMIDE 20 MG PO TABS: 20.0000 mg | ORAL_TABLET | Freq: Every day | ORAL | 0 refills | Status: DC

## 2018-06-16 MED ORDER — LOSARTAN POTASSIUM 25 MG PO TABS: 25.0000 mg | ORAL_TABLET | Freq: Every day | ORAL | 0 refills | Status: DC

## 2018-06-16 MED ORDER — HYDRALAZINE HCL 100 MG PO TABS: 100.0000 mg | ORAL_TABLET | Freq: Three times a day (TID) | ORAL | 0 refills | Status: DC

## 2018-06-16 NOTE — Discharge Summary (Signed)
[facility] NAME: Timothy Clark    MR#:  [id number]  DATE OF BIRTH:  September 30, 1970  DATE OF ADMISSION:  06/13/2018 ADMITTING PHYSICIAN: Demetrios Loll, MD  DATE OF DISCHARGE: 06/16/2018  PRIMARY CARE PHYSICIAN: [name], MD    ADMISSION DIAGNOSIS:  Hypertensive urgency [I16.0] Altered mental status, unspecified altered mental status type [R41.82] Closed displaced fracture of acromial end of right clavicle, initial encounter [S42.031A]  DISCHARGE DIAGNOSIS:  Active Problems:   HTN (hypertension), malignant   SECONDARY DIAGNOSIS:   Past Medical History:  Diagnosis Date  . Chronic abdominal pain   . Esophagitis   . Eye hemorrhage   . Gastritis   . Gastroparesis   . GERD (gastroesophageal reflux disease)   . Glaucoma   . Helicobacter pylori (H. pylori) infection 2016   ERADICATION DOCUMENTED VIA STOOL TEST in AUG 2016 at [facility]  . Hiccups   . Hypertension   . Nausea and vomiting    chronic, recurrent  . Type 2 diabetes mellitus with complications (HCC)    diagnosed around age 36    HOSPITAL COURSE:   47 year old male with past medical history of diabetes, hypertension, GERD, gastroparesis, history of esophagitis who presented to the hospital due to accelerated hypertension and altered mental status.  1.  Altered mental status-secondary to metabolic encephalopathy secondary to accelerated hypertension/Hypertensive urgency -Code stroke/stroke work-up was initiated but it has all been negative.  Patient's MRI of the brain was essentially normal.  Once patient's blood pressures has been well controlled his mental status is improved and is back to baseline now.  2.  Accelerated hypertension/hypertensive urgency- patient was placed on clonidine patch, given IV hydralazine IV labetalol. - Patient's hydralazine dose was increased his carvedilol dose was also increase and losartan was added to his regimen.  Patient's blood  pressures have improved since admission.  He is currently being discharged on a regimen as stated below with oral clonidine, hydralazine, carvedilol, losartan.  3.  Status post recent fall with right clavicular fracture- happened prior to patient's admission.  Patient was seen by physical therapy and he is being arranged for home health services upon discharge.  He will follow-up with orthopedics as an outpatient.  3.  Acute on chronic renal failure stage 3-baseline creatinine 1.9 and pt. Cr was as high as 3 in the hospital.  -Seen by nephrology and current acute kidney injury secondary to hemodynamic instability hypertensive urgency (vs) progression of his underlying CKD. -Patient's creatinine has started to trend down as patient's blood pressures have improved.  Patient will be discharged home with outpatient follow-up with nephrology as an outpatient.  4.  Hypokalemia- improved with supplementation, patient will be discharged on some low-dose amiloride.  Follow-up with nephrology as an outpatient.  5.  Diabetes type 2 with CKD stage III- pt. Will cont. His Insulin 70/30 and also Januvia upon discharge.    6.  Glaucoma-continue Alphagan, Timolol eyedrops.  7.  Hyperlipidemia- pt. Will continue atorvastatin.  Pt. Is being discharged home with Home Health PT, RN, Aide.   DISCHARGE CONDITIONS:   Stable.   CONSULTS OBTAINED:  Treatment Team:  Corey Skains, MD Murlean Iba, MD  DRUG ALLERGIES:   Allergies  Allergen Reactions  . Donnatal [Pb-Hyoscy-Atropine-Scopolamine] Anaphylaxis    Reaction to GI cocktail  . Fish Allergy Anaphylaxis, Hives and Rash  . Haldol [Haloperidol Lactate] Other (See Comments)    Chest Pain  . Lidocaine Anaphylaxis    Reaction  to GI cocktail  . Maalox [Calcium Carbonate Antacid] Anaphylaxis    Reaction to GI cocktail  . Marinol [Dronabinol]     Caused worsening vomiting.   . Aspirin Hives and Itching  . Bactrim  [Sulfamethoxazole-Trimethoprim] Itching  . Doxycycline Hives and Nausea And Vomiting    Severe   . Keflex [Cephalexin] Hives, Itching and Nausea And Vomiting  . Penicillins Hives and Itching    Has patient had a PCN reaction causing immediate rash, facial/tongue/throat swelling, SOB or lightheadedness with hypotension: yes Has patient had a PCN reaction causing severe rash involving mucus membranes or skin necrosis: yes Has patient had a PCN reaction that required hospitalization no Has patient had a PCN reaction occurring within the last 10 years: yes If all of the above answers are "NO", then may proceed with Cephalospor    DISCHARGE MEDICATIONS:   Allergies as of 06/16/2018      Reactions   Donnatal [pb-hyoscy-atropine-scopolamine] Anaphylaxis   Reaction to GI cocktail   Fish Allergy Anaphylaxis, Hives, Rash   Haldol [haloperidol Lactate] Other (See Comments)   Chest Pain   Lidocaine Anaphylaxis   Reaction to GI cocktail   Maalox [calcium Carbonate Antacid] Anaphylaxis   Reaction to GI cocktail   Marinol [dronabinol]    Caused worsening vomiting.    Aspirin Hives, Itching   Bactrim [sulfamethoxazole-trimethoprim] Itching   Doxycycline Hives, Nausea And Vomiting   Severe    Keflex [cephalexin] Hives, Itching, Nausea And Vomiting   Penicillins Hives, Itching   Has patient had a PCN reaction causing immediate rash, facial/tongue/throat swelling, SOB or lightheadedness with hypotension: yes Has patient had a PCN reaction causing severe rash involving mucus membranes or skin necrosis: yes Has patient had a PCN reaction that required hospitalization no Has patient had a PCN reaction occurring within the last 10 years: yes If all of the above answers are "NO", then may proceed with Cephalospor      Medication List    STOP taking these medications   lisinopril 5 MG tablet Commonly known as:  PRINIVIL,ZESTRIL     TAKE these medications   aMILoride 5 MG tablet Commonly known  as:  MIDAMOR Take 1 tablet (5 mg total) by mouth daily. Start taking on:  06/17/2018   atorvastatin 10 MG tablet Commonly known as:  LIPITOR Take 10 mg by mouth at bedtime.   brimonidine 0.2 % ophthalmic solution Commonly known as:  ALPHAGAN Place 1 drop into both eyes 3 (three) times daily.   carvedilol 25 MG tablet Commonly known as:  COREG Take 25 mg by mouth 2 (two) times daily with a meal.   cloNIDine 0.2 MG tablet Commonly known as:  CATAPRES Take 0.2 mg by mouth 2 (two) times daily.   cyanocobalamin 1000 MCG/ML injection Commonly known as:  (VITAMIN B-12) INJECT 1 ML (CC) INTRAMUSCULARLY ONCE A WEEK FOR 4 WEEKS THEN MONTHLY   dorzolamide 2 % ophthalmic solution Commonly known as:  TRUSOPT INSTILL 1 DROPS INTO EACH EYE TWICE DAILY   folic acid 1 MG tablet Commonly known as:  FOLVITE Take 1 mg by mouth daily.   hydrALAZINE 100 MG tablet Commonly known as:  APRESOLINE Take 1 tablet (100 mg total) by mouth 3 (three) times daily. What changed:    medication strength  how much to take   insulin aspart protamine- aspart (70-30) 100 UNIT/ML injection Commonly known as:  NOVOLOG MIX 70/30 Inject 0.25 mLs (25 Units total) into the skin 2 (two) times daily with a  meal.   lactulose 10 g packet Commonly known as:  CEPHULAC Take 1 packet (10 g total) by mouth every morning.   LORazepam 1 MG tablet Commonly known as:  ATIVAN Take 1 mg by mouth 2 (two) times daily as needed. for anxiety   losartan 25 MG tablet Commonly known as:  COZAAR Take 1 tablet (25 mg total) by mouth daily. Start taking on:  06/17/2018   pantoprazole 40 MG tablet Commonly known as:  PROTONIX Take 40 mg by mouth 2 (two) times daily.   potassium chloride SA 20 MEQ tablet Commonly known as:  K-DUR,KLOR-CON Take 1 tablet (20 mEq total) by mouth 2 (two) times daily.   Prodigy Autocode Blood Glucose w/Device Kit   promethazine 25 MG tablet Commonly known as:  PHENERGAN Take 0.5 tablets (12.5  mg total) by mouth every 6 (six) hours as needed for nausea or vomiting. To one tablet every 6 hours   sitaGLIPtin 50 MG tablet Commonly known as:  JANUVIA Take 50 mg by mouth daily. What changed:  Another medication with the same name was removed. Continue taking this medication, and follow the directions you see here.   sucralfate 1 g tablet Commonly known as:  CARAFATE Take 1 g by mouth 4 (four) times daily.   timolol 0.5 % ophthalmic solution Commonly known as:  TIMOPTIC Place 1 drop into both eyes 2 (two) times daily.   torsemide 20 MG tablet Commonly known as:  DEMADEX Take 1 tablet (20 mg total) by mouth daily. What changed:  See the new instructions.         DISCHARGE INSTRUCTIONS:   DIET:  Cardiac diet and Diabetic diet  DISCHARGE CONDITION:  Stable  ACTIVITY:  Activity as tolerated  OXYGEN:  Home Oxygen: No.   Oxygen Delivery: room air  DISCHARGE LOCATION:  Home with Home Health PT, RN, Aide.    If you experience worsening of your admission symptoms, develop shortness of breath, life threatening emergency, suicidal or homicidal thoughts you must seek medical attention immediately by calling 911 or calling your MD immediately  if symptoms less severe.  You Must read complete instructions/literature along with all the possible adverse reactions/side effects for all the Medicines you take and that have been prescribed to you. Take any new Medicines after you have completely understood and accpet all the possible adverse reactions/side effects.   Please note  You were cared for by a hospitalist during your hospital stay. If you have any questions about your discharge medications or the care you received while you were in the hospital after you are discharged, you can call the unit and asked to speak with the hospitalist on call if the hospitalist that took care of you is not available. Once you are discharged, your primary care physician will handle any further  medical issues. Please note that NO REFILLS for any discharge medications will be authorized once you are discharged, as it is imperative that you return to your primary care physician (or establish a relationship with a primary care physician if you do not have one) for your aftercare needs so that they can reassess your need for medications and monitor your lab values.     Today   No acute events overnight, worked with physical therapy and the recommend home health services.  Mental status at baseline, blood pressures are stable.  Discussed with nephrology and stable to be discharged from their standpoint.  VITAL SIGNS:  Blood pressure 131/67, pulse 69, temperature 98.7  F (37.1 C), temperature source Oral, resp. rate 14, weight (!) 139.8 kg, SpO2 98 %.  I/O:    Intake/Output Summary (Last 24 hours) at 06/16/2018 1604 Last data filed at 06/16/2018 1000 Gross per 24 hour  Intake 360 ml  Output 1650 ml  Net -1290 ml    PHYSICAL EXAMINATION:   GENERAL:  47 y.o.-year-old obese patient lying in bed in no acute distress.  EYES: Pupils equal, round, reactive to light and accommodation. No scleral icterus. Extraocular muscles intact.  HEENT: Head atraumatic, normocephalic. Oropharynx and nasopharynx clear.  NECK:  Supple, no jugular venous distention. No thyroid enlargement, no tenderness.  LUNGS: Normal breath sounds bilaterally, no wheezing, rales, rhonchi. No use of accessory muscles of respiration.  CARDIOVASCULAR: S1, S2 normal. No murmurs, rubs, or gallops.  ABDOMEN: Soft, nontender, nondistended. Bowel sounds present. No organomegaly or mass.  EXTREMITIES: No cyanosis, clubbing or edema b/l.  Right shoulder in sling due to clavicular fracture.  NEUROLOGIC: Cranial nerves II through XII are intact. No focal Motor or sensory deficits b/l.   PSYCHIATRIC: The patient is alert and oriented x 3.  SKIN: No obvious rash, lesion, or ulcer.    DATA REVIEW:   CBC Recent Labs  Lab  06/14/18 0211  WBC 8.2  HGB 7.9*  HCT 23.5*  PLT 163    Chemistries  Recent Labs  Lab 06/13/18 1225  06/15/18 0058 06/16/18 0448  NA 140   < > 138 142  K 3.2*   < > 2.9* 3.6  CL 103   < > 105 110  CO2 26   < > 28 26  GLUCOSE 127*   < > 131* 126*  BUN 27*   < > 23* 23*  CREATININE 3.21*   < > 2.97* 2.66*  CALCIUM 8.2*   < > 7.5* 7.8*  MG  --   --  2.0  --   AST 38  --   --   --   ALT 15  --   --   --   ALKPHOS 56  --   --   --   BILITOT 1.0  --   --   --    < > = values in this interval not displayed.    Cardiac Enzymes Recent Labs  Lab 06/14/18 0211  TROPONINI 0.10*    Microbiology Results  Results for orders placed or performed during the hospital encounter of 10/30/17  Culture, blood (Routine x 2)     Status: None   Collection Time: 10/30/17  8:20 PM  Result Value Ref Range Status   Specimen Description   Final    BLOOD RIGHT ARM Performed at [facility] [address] [place], [place] [address]    Special Requests   Final    BOTTLES DRAWN AEROBIC AND ANAEROBIC Blood Culture results may not be optimal due to an excessive volume of blood received in culture bottles Performed at [facility] [address], [place], [place] [address]    Culture   Final    NO GROWTH 5 DAYS Performed at [facility], [place] [address] [place], [place] [address]    Report Status 11/04/2017 FINAL  Final  Culture, blood (Routine x 2)     Status: None   Collection Time: 10/30/17  9:21 PM  Result Value Ref Range Status   Specimen Description   Final    BLOOD LEFT FOREARM Performed at [facility]  [address], [place], [place] [address]    Special Requests   Final    BOTTLES DRAWN AEROBIC AND ANAEROBIC Blood Culture adequate volume   Culture   Final    NO GROWTH 5 DAYS Performed at [facility], [place] [address], [place], [place] [address]    Report Status 11/04/2017 FINAL  Final  Urine culture      Status: Abnormal   Collection Time: 10/30/17 11:32 PM  Result Value Ref Range Status   Specimen Description   Final    URINE, CLEAN CATCH Performed at [facility], [place] [address] [place], [place] [address]    Special Requests   Final    Normal Performed at [facility], [place] [address] [place], [place] [address]    Culture (A)  Final    50,000 COLONIES/mL DIPHTHEROIDS(CORYNEBACTERIUM SPECIES) Standardized susceptibility testing for this organism is not available. Performed at [facility], [place] [address] [place], [place] [address]    Report Status 11/01/2017 FINAL  Final  MRSA PCR Screening     Status: None   Collection Time: 10/31/17  2:54 AM  Result Value Ref Range Status   MRSA by PCR NEGATIVE NEGATIVE Final    Comment:        The GeneXpert MRSA Assay (FDA approved for NASAL specimens only), is one component of a comprehensive MRSA colonization surveillance program. It is not intended to diagnose MRSA infection nor to guide or monitor treatment for MRSA infections. Performed at [facility], [place] [address] [place], [place] [address]   Urine Culture     Status: None   Collection Time: 11/03/17  1:42 AM  Result Value Ref Range Status   Specimen Description URINE, RANDOM  Final   Special Requests NONE  Final   Culture   Final    NO GROWTH Performed at [facility], [place] [address], [place], [place] [address]    Report Status 11/04/2017 FINAL  Final    RADIOLOGY:  [place] Ekg Site Rite  Result Date: 06/14/2018 If Site Rite image not attached, placement could not be confirmed due to current cardiac rhythm.     Management plans discussed with the patient, family and they are in agreement.  CODE STATUS:     Code Status Orders  (From admission, onward)         Start     Ordered   06/13/18 2031  Full code  Continuous     06/13/18 2030          TOTAL TIME TAKING CARE OF THIS PATIENT: 40 minutes.     Henreitta Leber M.D on 06/16/2018 at 4:04 PM  Between 7am to 6pm - Pager - [phone]  After 6pm go to [email or web address] - Proofreader  [facility]  Office  [phone]  CC: Primary care physician; [name], MD

## 2018-06-16 NOTE — Progress Notes (Signed)
Patient discharged home this afternoon with brother, medication regimen education updated, patient provided prescriptions and follow up appointment information. VSS, no complaints at this time.

## 2018-06-16 NOTE — Progress Notes (Signed)
[facility], [place] 06/16/18  Subjective:   No new c/o BP control is improving slowly Pharmacist clarified with patient - no true allergy to ACE-I; was taken off because of worsening Creatinine No acute SOB Still has some edema Appetite is good  Objective:  Vital signs in last 24 hours:  Temp:  [98 F (36.7 C)-99.1 F (37.3 C)] 98.7 F (37.1 C) (11/06 0854) Pulse Rate:  [64-69] 69 (11/06 0854) Resp:  [14-17] 14 (11/06 0854) BP: (146-173)/(83-97) 173/97 (11/06 0854) SpO2:  [98 %-100 %] 98 % (11/06 0854) Weight:  [139.8 kg] 139.8 kg (11/06 0639)  Weight change: 0.454 kg Filed Weights   06/13/18 1234 06/15/18 0410 06/16/18 0639  Weight: (!) 167.8 kg (!) 139.3 kg (!) 139.8 kg    Intake/Output:    Intake/Output Summary (Last 24 hours) at 06/16/2018 1041 Last data filed at 06/16/2018 1000 Gross per 24 hour  Intake 600 ml  Output 1950 ml  Net -1350 ml    Physical Exam: General:  No acute distress, laying in the bed  HEENT  blind in right eye, left eye poor vision  Neck:  Supple, no distended neck veins  Lungs:  Normal breathing effort, clear to auscultation  Heart::  Regular, no rub  Abdomen:  Soft, nontender, obese  Extremities:  1+ edema over legs,   Neurologic:  Alert, oriented, able to answer questions appropriately  Skin:  Dry sclay skin    Basic Metabolic Panel:  Recent Labs  Lab 06/09/18 1146 06/13/18 1225 06/13/18 2036 06/14/18 0211 06/15/18 0058 06/16/18 0448  NA 139 140  --  142 138 142  K 3.3* 3.2*  --  2.6* 2.9* 3.6  CL 103 103  --  106 105 110  CO2 25 26  --  27 28 26   GLUCOSE 206* 127*  --  124* 131* 126*  BUN 23* 27*  --  28* 23* 23*  CREATININE 3.20* 3.21* 3.13* 3.07* 2.97* 2.66*  CALCIUM 8.2* 8.2*  --  7.8* 7.5* 7.8*  MG  --   --   --   --  2.0  --      CBC: Recent Labs  Lab 06/09/18 1146 06/13/18 1225 06/14/18 0211  WBC 7.4 10.5 8.2  NEUTROABS  --  8.1*  --   HGB 8.0* 8.7* 7.9*  HCT 23.8* 25.7*  23.5*  MCV 93.0 93.5 93.3  PLT 148* 195 163      Lab Results  Component Value Date   HEPBSAG Negative 06/15/2018   HEPBSAB Non Reactive 06/15/2018      Microbiology:  No results found for this or any previous visit (from the past 240 hour(s)).  Coagulation Studies: Recent Labs    06/13/18 1225  LABPROT 13.6  INR 1.05    Urinalysis: Recent Labs    06/13/18 1316  COLORURINE YELLOW*  LABSPEC 1.019  PHURINE 6.0  GLUCOSEU 150*  HGBUR SMALL*  BILIRUBINUR NEGATIVE  KETONESUR 5*  PROTEINUR >=300*  NITRITE NEGATIVE  LEUKOCYTESUR NEGATIVE      Imaging: US Venous Img Upper Uni Left  Result Date: 06/14/2018 CLINICAL DATA:  Left arm swelling.  IV site infiltration. EXAM: LEFT UPPER EXTREMITY VENOUS DOPPLER ULTRASOUND TECHNIQUE: Gray-scale sonography with graded compression, as well as color Doppler and duplex ultrasound were performed to evaluate the upper extremity deep venous system from the level of the subclavian vein and including the jugular, axillary, basilic, radial, ulnar and upper cephalic vein. Spectral Doppler was utilized to evaluate flow at  rest and with distal augmentation maneuvers. COMPARISON:  None. FINDINGS: Contralateral Subclavian Vein: Respiratory phasicity is normal and symmetric with the symptomatic side. No evidence of thrombus. Internal Jugular Vein: No evidence of thrombus. Normal compressibility, respiratory phasicity and response to augmentation. Subclavian Vein: No evidence of thrombus. Normal compressibility, respiratory phasicity and response to augmentation. Axillary Vein: No evidence of thrombus. Normal compressibility, respiratory phasicity and response to augmentation. Cephalic Vein: No evidence of thrombus. Normal compressibility, respiratory phasicity and response to augmentation. Basilic Vein: No evidence of thrombus. Normal compressibility, respiratory phasicity and response to augmentation. Brachial Veins: No evidence of thrombus. Normal  compressibility, respiratory phasicity and response to augmentation. Radial Veins: No evidence of thrombus. Normal compressibility and color Doppler flow. Ulnar Veins: No evidence of thrombus. Normal compressibility and color Doppler flow. Venous Reflux:  None visualized. Other Findings:  Subcutaneous edema at the prior IV site. IMPRESSION: No evidence of DVT within the left upper extremity. Electronically Signed   By: Markus Daft M.D.   On: 06/14/2018 15:44   [place] Ekg Site Rite  Result Date: 06/14/2018 If Site Rite image not attached, placement could not be confirmed due to current cardiac rhythm.    Medications:    . aMILoride  5 mg Oral Daily  . atorvastatin  40 mg Oral QHS  . brimonidine  1 drop Both Eyes TID  . carvedilol  25 mg Oral BID WC  . cloNIDine  0.3 mg Transdermal Weekly  . clopidogrel  75 mg Oral Daily  . dorzolamide  1 drop Both Eyes BID  . folic acid  1 mg Oral Daily  . heparin  5,000 Units Subcutaneous Q8H  . hydrALAZINE  100 mg Oral TID  . insulin aspart  0-5 Units Subcutaneous QHS  . insulin aspart  0-9 Units Subcutaneous TID WC  . insulin aspart protamine- aspart  25 Units Subcutaneous BID WC  . lactulose  10 g Oral q morning - 10a  . losartan  25 mg Oral Daily  . sodium chloride flush  3 mL Intravenous Q12H  . sucralfate  1 g Oral QID  . timolol  1 drop Both Eyes BID   acetaminophen **OR** acetaminophen, bisacodyl, hydrALAZINE, HYDROcodone-acetaminophen, labetalol, LORazepam, morphine injection, senna-docusate  Assessment/ Plan:  47 y.o. male with diabetes type 2 with severe complications of peripheral neuropathy, severe retinopathy, nephropathy, obesity, chronic kidney disease, recent right clavicular fracture was admitted on 06/13/2018 with generalized weakness, hypertensive urgency.   1.  Acute renal failure on chronic kidney disease stage 3 and proteinuria Baseline creatinine 1.91, GFR 47 from July 2019 Underlying chronic kidney disease is likely secondary  to diabetic nephropathy Acute kidney injury likely secondary to hemodynamic instability, hypertensive urgency versus progression of underlying chronic kidney disease -Continue to try to achieve better blood pressure control -urine protein to creatinine ratio is > 4 gm; serologies pending  2.  Diabetes type 2 with chronic kidney disease, insulin-dependent Diabetes for poorly controlled in the past according to patient.  Hemoglobin A1c 12.7% in 2011   Ref. Range 06/13/2018 20:36  Hemoglobin A1C Latest Ref Range: 4.8 - 5.6 % 6.4 (H)   3.  Hypertension with CKD  Poorly controlled but resistant hypertension Current regimen includes Coreg 25 mg twice a day Clonidine patch 0.3 mg  Hydralazine 100 mg 3 times a day - started low-dose losartan if possible; pharmacist confirmed no true allergy to ACE-I; removed from allergy list - lisinopril listed as home medication  4.  Severe hypokalemia aldosterone renin ratio  pending Amiloride started KCl supplements Normal range now.  5.  Lower extremity edema We will consider small dose of torsemide in the near future if worse    LOS: [place] 11/6/201910:41 AM  [place], [place]  Note: This note was prepared with Dragon dictation. Any transcription errors are unintentional

## 2018-06-16 NOTE — Care Management Note (Signed)
Case Management Note  Patient Details  Name: Timothy Clark MRN: [id number] Date of Birth: 13-May-1971  Subjective/Objective:       Discharging today to his family's house in [place].  Resumption of home health orders entered RN, PT, Aide.  Spoke with Baker Janus with [facility] and notified her of discharge today.               Action/Plan:   Expected Discharge Date:  06/16/18               Expected Discharge Plan:  [place]  In-House Referral:     Discharge planning Services  CM Consult  Post Acute Care Choice:  Resumption of Svcs/PTA Provider Choice offered to:     DME Arranged:    DME Agency:     HH Arranged:  RN, PT, Nurse's Aide HH Agency:  ([facility] in [place])  Status of Service:  Completed, signed off  If discussed at [place] of Stay Meetings, dates discussed:    Additional Comments:  Elza Rafter, RN 06/16/2018, 1:41 PM

## 2018-06-16 NOTE — Patient Outreach (Signed)
[place] [facility]) Care Management  06/17/2018  Timothy Clark [birth year] [id number]   Received patient portion of patient assistance applications. Prepared [employer] application for [employer] to be mailed. Refexed provider portion of [place] application to provider for completion.  Once provider portion of [place] application has been received, will submit to [place] patient assistance.  Will follow up with [employer] in 10-14 business days to check status of application.  [name] Chana Bode [place] Certified Pharmacy Technician [place] Management Direct [phone]

## 2018-06-17 ENCOUNTER — Ambulatory Visit: Payer: Self-pay | Admitting: Pharmacy Technician

## 2018-06-17 DIAGNOSIS — F209 Schizophrenia, unspecified: Secondary | ICD-10-CM | POA: Diagnosis not present

## 2018-06-17 DIAGNOSIS — E11621 Type 2 diabetes mellitus with foot ulcer: Secondary | ICD-10-CM | POA: Diagnosis not present

## 2018-06-17 DIAGNOSIS — H409 Unspecified glaucoma: Secondary | ICD-10-CM | POA: Diagnosis not present

## 2018-06-17 DIAGNOSIS — L97512 Non-pressure chronic ulcer of other part of right foot with fat layer exposed: Secondary | ICD-10-CM | POA: Diagnosis not present

## 2018-06-17 DIAGNOSIS — E1122 Type 2 diabetes mellitus with diabetic chronic kidney disease: Secondary | ICD-10-CM | POA: Diagnosis not present

## 2018-06-17 DIAGNOSIS — N183 Chronic kidney disease, stage 3 (moderate): Secondary | ICD-10-CM | POA: Diagnosis not present

## 2018-06-17 DIAGNOSIS — I5032 Chronic diastolic (congestive) heart failure: Secondary | ICD-10-CM | POA: Diagnosis not present

## 2018-06-17 DIAGNOSIS — F419 Anxiety disorder, unspecified: Secondary | ICD-10-CM | POA: Diagnosis not present

## 2018-06-17 DIAGNOSIS — E1143 Type 2 diabetes mellitus with diabetic autonomic (poly)neuropathy: Secondary | ICD-10-CM | POA: Diagnosis not present

## 2018-06-17 DIAGNOSIS — I130 Hypertensive heart and chronic kidney disease with heart failure and stage 1 through stage 4 chronic kidney disease, or unspecified chronic kidney disease: Secondary | ICD-10-CM | POA: Diagnosis not present

## 2018-06-18 DIAGNOSIS — S42031A Displaced fracture of lateral end of right clavicle, initial encounter for closed fracture: Secondary | ICD-10-CM | POA: Diagnosis not present

## 2018-06-18 LAB — ALDOSTERONE + RENIN ACTIVITY W/ RATIO
ALDO / PRA Ratio: <3.9 (ref 0.0–30.0)
Aldosterone: <1 ng/dL (ref 0.0–30.0)
PRA LC/MS/MS: 0.258 ng/mL/hr (ref 0.167–5.380)

## 2018-06-22 DIAGNOSIS — I10 Essential (primary) hypertension: Secondary | ICD-10-CM | POA: Diagnosis not present

## 2018-06-22 DIAGNOSIS — E876 Hypokalemia: Secondary | ICD-10-CM | POA: Diagnosis not present

## 2018-06-22 DIAGNOSIS — I7025 Atherosclerosis of native arteries of other extremities with ulceration: Secondary | ICD-10-CM | POA: Diagnosis not present

## 2018-06-23 DIAGNOSIS — E11621 Type 2 diabetes mellitus with foot ulcer: Secondary | ICD-10-CM | POA: Diagnosis not present

## 2018-06-23 DIAGNOSIS — I5032 Chronic diastolic (congestive) heart failure: Secondary | ICD-10-CM | POA: Diagnosis not present

## 2018-06-23 DIAGNOSIS — E1122 Type 2 diabetes mellitus with diabetic chronic kidney disease: Secondary | ICD-10-CM | POA: Diagnosis not present

## 2018-06-23 DIAGNOSIS — L97512 Non-pressure chronic ulcer of other part of right foot with fat layer exposed: Secondary | ICD-10-CM | POA: Diagnosis not present

## 2018-06-23 DIAGNOSIS — I130 Hypertensive heart and chronic kidney disease with heart failure and stage 1 through stage 4 chronic kidney disease, or unspecified chronic kidney disease: Secondary | ICD-10-CM | POA: Diagnosis not present

## 2018-06-23 DIAGNOSIS — N183 Chronic kidney disease, stage 3 (moderate): Secondary | ICD-10-CM | POA: Diagnosis not present

## 2018-06-24 DIAGNOSIS — N183 Chronic kidney disease, stage 3 (moderate): Secondary | ICD-10-CM | POA: Diagnosis not present

## 2018-06-24 DIAGNOSIS — R809 Proteinuria, unspecified: Secondary | ICD-10-CM | POA: Diagnosis not present

## 2018-06-24 DIAGNOSIS — I129 Hypertensive chronic kidney disease with stage 1 through stage 4 chronic kidney disease, or unspecified chronic kidney disease: Secondary | ICD-10-CM | POA: Diagnosis not present

## 2018-06-24 DIAGNOSIS — E1122 Type 2 diabetes mellitus with diabetic chronic kidney disease: Secondary | ICD-10-CM | POA: Diagnosis not present

## 2018-06-24 DIAGNOSIS — R600 Localized edema: Secondary | ICD-10-CM | POA: Diagnosis not present

## 2018-06-25 ENCOUNTER — Other Ambulatory Visit: Payer: Self-pay

## 2018-06-25 ENCOUNTER — Other Ambulatory Visit: Payer: Self-pay | Admitting: Pharmacy Technician

## 2018-06-25 ENCOUNTER — Ambulatory Visit: Payer: Self-pay

## 2018-06-25 DIAGNOSIS — Z0000 Encounter for general adult medical examination without abnormal findings: Secondary | ICD-10-CM | POA: Diagnosis not present

## 2018-06-25 DIAGNOSIS — R601 Generalized edema: Secondary | ICD-10-CM | POA: Diagnosis not present

## 2018-06-25 DIAGNOSIS — R0602 Shortness of breath: Secondary | ICD-10-CM | POA: Diagnosis not present

## 2018-06-25 DIAGNOSIS — Z6841 Body Mass Index (BMI) 40.0 and over, adult: Secondary | ICD-10-CM | POA: Diagnosis not present

## 2018-06-25 DIAGNOSIS — E11621 Type 2 diabetes mellitus with foot ulcer: Secondary | ICD-10-CM | POA: Diagnosis not present

## 2018-06-25 DIAGNOSIS — I5032 Chronic diastolic (congestive) heart failure: Secondary | ICD-10-CM | POA: Diagnosis not present

## 2018-06-25 DIAGNOSIS — E1122 Type 2 diabetes mellitus with diabetic chronic kidney disease: Secondary | ICD-10-CM | POA: Diagnosis not present

## 2018-06-25 DIAGNOSIS — D509 Iron deficiency anemia, unspecified: Secondary | ICD-10-CM | POA: Diagnosis not present

## 2018-06-25 DIAGNOSIS — R944 Abnormal results of kidney function studies: Secondary | ICD-10-CM | POA: Diagnosis not present

## 2018-06-25 DIAGNOSIS — N183 Chronic kidney disease, stage 3 (moderate): Secondary | ICD-10-CM | POA: Diagnosis not present

## 2018-06-25 DIAGNOSIS — I130 Hypertensive heart and chronic kidney disease with heart failure and stage 1 through stage 4 chronic kidney disease, or unspecified chronic kidney disease: Secondary | ICD-10-CM | POA: Diagnosis not present

## 2018-06-25 DIAGNOSIS — L97512 Non-pressure chronic ulcer of other part of right foot with fat layer exposed: Secondary | ICD-10-CM | POA: Diagnosis not present

## 2018-06-25 NOTE — Patient Outreach (Signed)
[place] [facility]) Care Management  06/25/2018  Timothy Clark Dec 23, 1970 [id number]   Follow up call to [employer] patient assistance to check status of application for Januvia. Nicole Kindred confirms application was received and attestation form was mailed out to patient on 11/13.    Will collaborate with [facility] RPh Joetta Manners on contacting patient inform of attestation form and how to fill out.  [name] Chana Bode [place] Certified Pharmacy Technician [place] Management Direct Dial:[phone]

## 2018-06-25 NOTE — Patient Outreach (Signed)
[place] [facility]) Care Management  Timothy Clark  06/25/2018  CAGE Timothy Clark May 25, 1971 [id number]   Unsuccessful telephone call attempt # 1 to patient to follow up on Extra Help LIS application and to complete a discharge medication review.   Patient assistance attestation letter coming from [employer] that [name] needs to sign.   HIPAA compliant voicemail left requesting a return call.  Plan:  I will make another outreach attempt to patient within 3-4 business days.  Joetta Manners, PharmD Clinical Pharmacist [place] [phone]

## 2018-06-29 DIAGNOSIS — L97512 Non-pressure chronic ulcer of other part of right foot with fat layer exposed: Secondary | ICD-10-CM | POA: Diagnosis not present

## 2018-06-29 DIAGNOSIS — E1122 Type 2 diabetes mellitus with diabetic chronic kidney disease: Secondary | ICD-10-CM | POA: Diagnosis not present

## 2018-06-29 DIAGNOSIS — N183 Chronic kidney disease, stage 3 (moderate): Secondary | ICD-10-CM | POA: Diagnosis not present

## 2018-06-29 DIAGNOSIS — E11621 Type 2 diabetes mellitus with foot ulcer: Secondary | ICD-10-CM | POA: Diagnosis not present

## 2018-06-29 DIAGNOSIS — I5032 Chronic diastolic (congestive) heart failure: Secondary | ICD-10-CM | POA: Diagnosis not present

## 2018-06-29 DIAGNOSIS — I130 Hypertensive heart and chronic kidney disease with heart failure and stage 1 through stage 4 chronic kidney disease, or unspecified chronic kidney disease: Secondary | ICD-10-CM | POA: Diagnosis not present

## 2018-06-30 ENCOUNTER — Other Ambulatory Visit: Payer: Self-pay

## 2018-06-30 ENCOUNTER — Other Ambulatory Visit: Payer: Self-pay | Admitting: Pharmacy Technician

## 2018-06-30 ENCOUNTER — Ambulatory Visit: Payer: Self-pay

## 2018-06-30 DIAGNOSIS — I509 Heart failure, unspecified: Secondary | ICD-10-CM | POA: Diagnosis not present

## 2018-06-30 DIAGNOSIS — E1122 Type 2 diabetes mellitus with diabetic chronic kidney disease: Secondary | ICD-10-CM | POA: Diagnosis not present

## 2018-06-30 DIAGNOSIS — E118 Type 2 diabetes mellitus with unspecified complications: Secondary | ICD-10-CM | POA: Diagnosis not present

## 2018-06-30 DIAGNOSIS — M86179 Other acute osteomyelitis, unspecified ankle and foot: Secondary | ICD-10-CM | POA: Diagnosis not present

## 2018-06-30 DIAGNOSIS — N183 Chronic kidney disease, stage 3 (moderate): Secondary | ICD-10-CM | POA: Diagnosis not present

## 2018-06-30 DIAGNOSIS — H548 Legal blindness, as defined in USA: Secondary | ICD-10-CM | POA: Diagnosis not present

## 2018-06-30 NOTE — Patient Outreach (Signed)
Timothy Clark) Care Management  Timothy Clark   06/30/2018  Timothy Clark 06-Sep-1970 [id number]  Successful outreach to Timothy Clark.  HIPAA identifiers verified.  Subjective: Timothy Clark reports that he was recently hospitalized for a hypertensive urgency.  He reports that his is feeling much better and not having headaches or light headedness as he had previously experienced when his blood pressure was high.  He states he is going to the drug store to buy himself a blood pressure monitor.  Patient reports that he saw his PCP, Timothy Clark since discharge.   Objective:  SCr 2.53m/dL on 06/16/18, was 3.21 mg/dL on 10/30, estimated CrCL of 34 m/min Potassium 3.6 mmol/L  Current Medications: Current Outpatient Medications  Medication Sig Dispense Refill  . aMILoride (MIDAMOR) 5 MG tablet Take 1 tablet (5 mg total) by mouth daily. 30 tablet 0  . atorvastatin (LIPITOR) 10 MG tablet Take 10 mg by mouth at bedtime.     . Blood Glucose Monitoring Suppl (PRODIGY AUTOCODE BLOOD GLUCOSE) w/Device KIT     . brimonidine (ALPHAGAN) 0.2 % ophthalmic solution Place 1 drop into both eyes 3 (three) times daily.     . carvedilol (COREG) 25 MG tablet Take 25 mg by mouth 2 (two) times daily with a meal.    . cloNIDine (CATAPRES) 0.2 MG tablet Take 0.4 mg by mouth 2 (two) times daily.     . cyanocobalamin (,VITAMIN B-12,) 1000 MCG/ML injection INJECT 1 ML (CC) INTRAMUSCULARLY ONCE A WEEK FOR 4 WEEKS THEN MONTHLY  1  . dorzolamide (TRUSOPT) 2 % ophthalmic solution INSTILL 1 DROPS INTO EACH EYE TWICE DAILY  11  . folic acid (FOLVITE) 1 MG tablet Take 1 mg by mouth daily.    . hydrALAZINE (APRESOLINE) 100 MG tablet Take 1 tablet (100 mg total) by mouth 3 (three) times daily. 90 tablet 0  . insulin aspart protamine- aspart (NOVOLOG MIX 70/30) (70-30) 100 UNIT/ML injection Inject 0.25 mLs (25 Units total) into the skin 2 (two) times daily with a meal. 10 mL 11  . lactulose (CEPHULAC) 10 g packet  Take 1 packet (10 g total) by mouth every morning. 30 each 0  . LORazepam (ATIVAN) 1 MG tablet Take 1 mg by mouth 2 (two) times daily as needed. for anxiety  3  . losartan (COZAAR) 25 MG tablet Take 1 tablet (25 mg total) by mouth daily. 30 tablet 0  . pantoprazole (PROTONIX) 40 MG tablet Take 40 mg by mouth 2 (two) times daily.    . potassium chloride SA (K-DUR,KLOR-CON) 20 MEQ tablet Take 1 tablet (20 mEq total) by mouth 2 (two) times daily. 20 tablet 0  . promethazine (PHENERGAN) 25 MG tablet Take 0.5 tablets (12.5 mg total) by mouth every 6 (six) hours as needed for nausea or vomiting. To one tablet every 6 hours 40 tablet 3  . sitaGLIPtin (JANUVIA) 50 MG tablet Take 50 mg by mouth daily.    . sucralfate (CARAFATE) 1 g tablet Take 1 g by mouth 4 (four) times daily.     . timolol (TIMOPTIC) 0.5 % ophthalmic solution Place 1 drop into both eyes 2 (two) times daily.    .Marland Kitchentorsemide (DEMADEX) 20 MG tablet Take 1 tablet (20 mg total) by mouth daily. 30 tablet 0   No current facility-administered medications for this visit.     Functional Status: In your present state of health, do you have any difficulty performing the following activities: 06/13/2018 11/10/2017  Hearing? N  N  Vision? Y Y  Comment - -  Difficulty concentrating or making decisions? N N  Walking or climbing stairs? Y Y  Dressing or bathing? N N  Doing errands, shopping? Y Y  Some recent data might be hidden    Fall/Depression Screening: Fall Risk  07/27/2017 06/22/2017 05/25/2017  Falls in the past year? Yes No No  Number falls in past yr: 2 or more - -  Injury with Fall? No No -   PHQ 2/9 Scores 05/11/2018 04/22/2018 07/27/2017 06/22/2017 05/25/2017 04/27/2017 04/06/2017  PHQ - 2 Score _0 0 0 1 0  PHQ- 9 Score 5 - - - - - -   ASSESSMENT: Date Discharged from Hospital: 06/16/18 Date Medication Reconciliation Performed: 06/30/2018  Medications Discontinued at Discharge:   Lisinopril  New Medications at Discharge:    Amiloride  losartan  Medications with Dose Adjustments at Discharge: . hydralazine  Patient was recently discharged from hospital and all medications have been reviewed.  Drugs sorted by system:  Neurologic/Psychologic: lorazepam  Cardiovascular: amiloride, atorvastatin, carvedilol, clonidine, hydralazine, losartan, potassium chloride, torsemide  Gastrointestinal: lactulose, pantoprazole, promethazine, sucralfate  Endocrine:  Novolog 70/30, sitagliptin  Topical: brimonidine, dorzolamide, timolol  Vitamins/Minerals/Supplements: cyanocobalamin, folic acid,   Medication Review Findings:  . Amiloride, losartan and potassium chloride- co-administration of these agents may lead to hyperkalemia.  Monitor potassium levels.  .  Amiloride- use with caution in patients with chronic renal insufficiency.  Administer at 50% of normal dose.   Medication Assistance: Timothy Clark reports that he was approved for Extra Help LIS, but  states that he cannot find the approval letter.  I had [employer] run a test claim for Januvia and it was $71, therefore he did not receive Full LIS.  Patient was instructed to call [facility] CPhT, Etter Sjogren to follow up on filling out his Medical sales representative.  Patient has her phone number and verbalized understanding.   Plan: Route note to PCP, Timothy Clark.   Continue to follow for patient assistance.   Joetta Manners, PharmD Clinical Pharmacist [place] [phone]

## 2018-06-30 NOTE — Patient Outreach (Signed)
[place] [facility]) Care Management  06/30/2018  Timothy Clark [birth year] [id number]   Incoming call from patient, HIPAA identifiers verified. Informed patient that [employer] patient assistance mailed out attestation form to him on 11/13. Patient states that he has not received it yet. Requested that he contact me when he receives it so that I can help him fill it out. He stated that he would.   Will follow up with patient in 3-5 business days if call has not been returned.  [name] Chana Bode [facility] Certified Pharmacy Technician [place] Management Direct Dial:[phone]

## 2018-07-02 DIAGNOSIS — E11621 Type 2 diabetes mellitus with foot ulcer: Secondary | ICD-10-CM | POA: Diagnosis not present

## 2018-07-02 DIAGNOSIS — N183 Chronic kidney disease, stage 3 (moderate): Secondary | ICD-10-CM | POA: Diagnosis not present

## 2018-07-02 DIAGNOSIS — L97512 Non-pressure chronic ulcer of other part of right foot with fat layer exposed: Secondary | ICD-10-CM | POA: Diagnosis not present

## 2018-07-02 DIAGNOSIS — I130 Hypertensive heart and chronic kidney disease with heart failure and stage 1 through stage 4 chronic kidney disease, or unspecified chronic kidney disease: Secondary | ICD-10-CM | POA: Diagnosis not present

## 2018-07-02 DIAGNOSIS — E1122 Type 2 diabetes mellitus with diabetic chronic kidney disease: Secondary | ICD-10-CM | POA: Diagnosis not present

## 2018-07-02 DIAGNOSIS — I5032 Chronic diastolic (congestive) heart failure: Secondary | ICD-10-CM | POA: Diagnosis not present

## 2018-07-05 DIAGNOSIS — Z961 Presence of intraocular lens: Secondary | ICD-10-CM | POA: Diagnosis not present

## 2018-07-05 DIAGNOSIS — H401133 Primary open-angle glaucoma, bilateral, severe stage: Secondary | ICD-10-CM | POA: Diagnosis not present

## 2018-07-06 DIAGNOSIS — E1122 Type 2 diabetes mellitus with diabetic chronic kidney disease: Secondary | ICD-10-CM | POA: Diagnosis not present

## 2018-07-06 DIAGNOSIS — E11621 Type 2 diabetes mellitus with foot ulcer: Secondary | ICD-10-CM | POA: Diagnosis not present

## 2018-07-06 DIAGNOSIS — I130 Hypertensive heart and chronic kidney disease with heart failure and stage 1 through stage 4 chronic kidney disease, or unspecified chronic kidney disease: Secondary | ICD-10-CM | POA: Diagnosis not present

## 2018-07-06 DIAGNOSIS — I5032 Chronic diastolic (congestive) heart failure: Secondary | ICD-10-CM | POA: Diagnosis not present

## 2018-07-06 DIAGNOSIS — L97512 Non-pressure chronic ulcer of other part of right foot with fat layer exposed: Secondary | ICD-10-CM | POA: Diagnosis not present

## 2018-07-06 DIAGNOSIS — N183 Chronic kidney disease, stage 3 (moderate): Secondary | ICD-10-CM | POA: Diagnosis not present

## 2018-07-07 ENCOUNTER — Ambulatory Visit: Payer: Medicare HMO | Admitting: Podiatry

## 2018-07-07 ENCOUNTER — Encounter: Payer: Self-pay | Admitting: Podiatry

## 2018-07-07 DIAGNOSIS — E1143 Type 2 diabetes mellitus with diabetic autonomic (poly)neuropathy: Secondary | ICD-10-CM | POA: Diagnosis not present

## 2018-07-07 DIAGNOSIS — L97512 Non-pressure chronic ulcer of other part of right foot with fat layer exposed: Secondary | ICD-10-CM | POA: Diagnosis not present

## 2018-07-07 DIAGNOSIS — F209 Schizophrenia, unspecified: Secondary | ICD-10-CM | POA: Diagnosis not present

## 2018-07-07 DIAGNOSIS — N183 Chronic kidney disease, stage 3 (moderate): Secondary | ICD-10-CM | POA: Diagnosis not present

## 2018-07-07 DIAGNOSIS — I70235 Atherosclerosis of native arteries of right leg with ulceration of other part of foot: Secondary | ICD-10-CM | POA: Diagnosis not present

## 2018-07-07 DIAGNOSIS — B351 Tinea unguium: Secondary | ICD-10-CM | POA: Diagnosis not present

## 2018-07-07 DIAGNOSIS — I130 Hypertensive heart and chronic kidney disease with heart failure and stage 1 through stage 4 chronic kidney disease, or unspecified chronic kidney disease: Secondary | ICD-10-CM | POA: Diagnosis not present

## 2018-07-07 DIAGNOSIS — D631 Anemia in chronic kidney disease: Secondary | ICD-10-CM | POA: Diagnosis not present

## 2018-07-07 DIAGNOSIS — E1122 Type 2 diabetes mellitus with diabetic chronic kidney disease: Secondary | ICD-10-CM | POA: Diagnosis not present

## 2018-07-07 DIAGNOSIS — I5032 Chronic diastolic (congestive) heart failure: Secondary | ICD-10-CM | POA: Diagnosis not present

## 2018-07-07 DIAGNOSIS — M79676 Pain in unspecified toe(s): Secondary | ICD-10-CM

## 2018-07-07 DIAGNOSIS — E0842 Diabetes mellitus due to underlying condition with diabetic polyneuropathy: Secondary | ICD-10-CM | POA: Diagnosis not present

## 2018-07-07 DIAGNOSIS — H409 Unspecified glaucoma: Secondary | ICD-10-CM | POA: Diagnosis not present

## 2018-07-07 DIAGNOSIS — F319 Bipolar disorder, unspecified: Secondary | ICD-10-CM | POA: Diagnosis not present

## 2018-07-11 NOTE — Progress Notes (Signed)
   HPI: 47 year old male with history of diabetes mellitus and partial first ray amputation of the right foot presents today for follow-up evaluation regarding an ulcer to the lateral aspect of the right foot. He states the wound is doing much better. His home health nurse believes the wound has healed. He denies any significant pain or modifying factors. Patient is here for further evaluation and treatment.    Past Medical History:  Diagnosis Date  . Chronic abdominal pain   . Esophagitis   . Eye hemorrhage   . Gastritis   . Gastroparesis   . GERD (gastroesophageal reflux disease)   . Glaucoma   . Helicobacter pylori (H. pylori) infection 2016   ERADICATION DOCUMENTED VIA STOOL TEST in AUG 2016 at [place]  . Hiccups   . Hypertension   . Nausea and vomiting    chronic, recurrent  . Type 2 diabetes mellitus with complications ([place])    diagnosed around age 70     Physical Exam: General: The patient is alert and oriented x3 in no acute distress.  Dermatology: Wound noted to the lateral right foot has healed. Complete re-epithelialization has occurred. No drainage noted. Skin is dry and supple bilateral. Negative open lesions or macerations. Remaining integument unremarkable. Nails are tender, long, thickened and dystrophic with subungual debris, consistent with onychomycosis, 1-5 left, 2-5 right. No signs of infection noted.  Vascular: Moderate edema located to the lower extremity bilateral. Pedal pulses are faintly palpable.  Neurological: Epicritic and protective threshold absent bilaterally.   Musculoskeletal Exam: History of partial first ray amputation right foot noted.  Assessment: 1. Ulceration of the right lateral foot secondary to DM - healed 2. H/o partial 1st ray amputation right  3. Onychomycosis of nail due to dermatophyte bilateral   Plan of Care:  1. Patient evaluated.  2. Light debridement of wound performed using a tissue nipper.  3. Mechanical debridement of  nails 1-5 left, 2-5 right performed using a nail nipper. Filed with dremel without incident.  4. Prescription for Gentamicin cream provided to patient to use daily for one week on the right foot with a bandage.  5. Return to clinic in 3 months for routine nail care.      Edrick Kins, DPM [facility]  Dr. Edrick Kins, DPM    [address], [place] [address]                Office [phone]  Fax [phone]

## 2018-07-13 DIAGNOSIS — F319 Bipolar disorder, unspecified: Secondary | ICD-10-CM | POA: Diagnosis not present

## 2018-07-13 DIAGNOSIS — I130 Hypertensive heart and chronic kidney disease with heart failure and stage 1 through stage 4 chronic kidney disease, or unspecified chronic kidney disease: Secondary | ICD-10-CM | POA: Diagnosis not present

## 2018-07-13 DIAGNOSIS — D631 Anemia in chronic kidney disease: Secondary | ICD-10-CM | POA: Diagnosis not present

## 2018-07-13 DIAGNOSIS — I5032 Chronic diastolic (congestive) heart failure: Secondary | ICD-10-CM | POA: Diagnosis not present

## 2018-07-13 DIAGNOSIS — N183 Chronic kidney disease, stage 3 (moderate): Secondary | ICD-10-CM | POA: Diagnosis not present

## 2018-07-13 DIAGNOSIS — E1122 Type 2 diabetes mellitus with diabetic chronic kidney disease: Secondary | ICD-10-CM | POA: Diagnosis not present

## 2018-07-13 DIAGNOSIS — S42031D Displaced fracture of lateral end of right clavicle, subsequent encounter for fracture with routine healing: Secondary | ICD-10-CM | POA: Diagnosis not present

## 2018-07-15 ENCOUNTER — Other Ambulatory Visit: Payer: Self-pay | Admitting: Pharmacy Technician

## 2018-07-15 ENCOUNTER — Other Ambulatory Visit: Payer: Self-pay

## 2018-07-15 NOTE — Patient Outreach (Signed)
[place] [facility]) Care Management  [place]  07/15/2018  Timothy Clark [birth year] [id number]  Unsuccessful telephone call attempt # 1 to [name].  Requested by [facility] CPhT, to make outreach attempt because [name] has not called to report that he received the [employer] letter as requested.   Plan:  I will make another outreach attempt to patient within 3-4 business days.  Joetta Manners, PharmD Clinical Pharmacist [place] [phone]

## 2018-07-15 NOTE — Patient Outreach (Signed)
[place] [facility]) Care Management  07/15/2018  Timothy Clark 07-09-1971 [id number]

## 2018-07-19 ENCOUNTER — Other Ambulatory Visit: Payer: Self-pay

## 2018-07-19 NOTE — Patient Outreach (Signed)
[place] [facility]) Care Management  07/19/2018  Timothy Clark 22-Jan-1971 [id number]   Follow up call made on 12/05 to patient due to have not receiving a call from him about Attestation letter that was mailed from [employer] patient assistance. Left HIPAA compliant voicemail left.   Informed [facility] RPh Joetta Manners of outreach as well.  [name] Chana Bode [place] Certified Pharmacy Technician [place] Management Direct Dial:[phone]

## 2018-07-19 NOTE — Patient Outreach (Signed)
[place] [facility]) Care Management  [place]  07/19/2018  JAECE DUCHARME 1971-05-02 [id number]  Unsuccessful telephone call attempt # 2 to Mr. Selley.  Requested by [facility] CPhT, to make outreach attempt because Mr. Bulman has not called to report that he received the [employer] letter as requested.   Plan:  I will make another outreach attempt to patient within 3-4 business days.  Joetta Manners, PharmD Clinical Pharmacist [place] [phone]

## 2018-07-20 ENCOUNTER — Ambulatory Visit: Payer: Self-pay

## 2018-07-20 DIAGNOSIS — N183 Chronic kidney disease, stage 3 (moderate): Secondary | ICD-10-CM | POA: Diagnosis not present

## 2018-07-20 DIAGNOSIS — R600 Localized edema: Secondary | ICD-10-CM | POA: Diagnosis not present

## 2018-07-20 DIAGNOSIS — E1129 Type 2 diabetes mellitus with other diabetic kidney complication: Secondary | ICD-10-CM | POA: Diagnosis not present

## 2018-07-20 DIAGNOSIS — I129 Hypertensive chronic kidney disease with stage 1 through stage 4 chronic kidney disease, or unspecified chronic kidney disease: Secondary | ICD-10-CM | POA: Diagnosis not present

## 2018-07-20 DIAGNOSIS — I130 Hypertensive heart and chronic kidney disease with heart failure and stage 1 through stage 4 chronic kidney disease, or unspecified chronic kidney disease: Secondary | ICD-10-CM | POA: Diagnosis not present

## 2018-07-20 DIAGNOSIS — R809 Proteinuria, unspecified: Secondary | ICD-10-CM | POA: Diagnosis not present

## 2018-07-20 DIAGNOSIS — E1122 Type 2 diabetes mellitus with diabetic chronic kidney disease: Secondary | ICD-10-CM | POA: Diagnosis not present

## 2018-07-20 DIAGNOSIS — D631 Anemia in chronic kidney disease: Secondary | ICD-10-CM | POA: Diagnosis not present

## 2018-07-20 DIAGNOSIS — F319 Bipolar disorder, unspecified: Secondary | ICD-10-CM | POA: Diagnosis not present

## 2018-07-20 DIAGNOSIS — I5032 Chronic diastolic (congestive) heart failure: Secondary | ICD-10-CM | POA: Diagnosis not present

## 2018-07-23 ENCOUNTER — Other Ambulatory Visit: Payer: Self-pay

## 2018-07-23 ENCOUNTER — Ambulatory Visit: Payer: Self-pay

## 2018-07-23 DIAGNOSIS — Z6841 Body Mass Index (BMI) 40.0 and over, adult: Secondary | ICD-10-CM | POA: Diagnosis not present

## 2018-07-23 DIAGNOSIS — D519 Vitamin B12 deficiency anemia, unspecified: Secondary | ICD-10-CM | POA: Diagnosis not present

## 2018-07-23 DIAGNOSIS — K219 Gastro-esophageal reflux disease without esophagitis: Secondary | ICD-10-CM | POA: Diagnosis not present

## 2018-07-23 DIAGNOSIS — S91302D Unspecified open wound, left foot, subsequent encounter: Secondary | ICD-10-CM | POA: Diagnosis not present

## 2018-07-23 DIAGNOSIS — E1122 Type 2 diabetes mellitus with diabetic chronic kidney disease: Secondary | ICD-10-CM | POA: Diagnosis not present

## 2018-07-23 DIAGNOSIS — I7090 Unspecified atherosclerosis: Secondary | ICD-10-CM | POA: Diagnosis not present

## 2018-07-23 DIAGNOSIS — I130 Hypertensive heart and chronic kidney disease with heart failure and stage 1 through stage 4 chronic kidney disease, or unspecified chronic kidney disease: Secondary | ICD-10-CM | POA: Diagnosis not present

## 2018-07-23 DIAGNOSIS — N183 Chronic kidney disease, stage 3 (moderate): Secondary | ICD-10-CM | POA: Diagnosis not present

## 2018-07-23 DIAGNOSIS — D509 Iron deficiency anemia, unspecified: Secondary | ICD-10-CM | POA: Diagnosis not present

## 2018-07-23 DIAGNOSIS — I10 Essential (primary) hypertension: Secondary | ICD-10-CM | POA: Diagnosis not present

## 2018-07-23 DIAGNOSIS — F419 Anxiety disorder, unspecified: Secondary | ICD-10-CM | POA: Diagnosis not present

## 2018-07-23 NOTE — Patient Outreach (Signed)
[place] [facility]) Care Management  [place]  07/23/2018  [name] 1970-11-11 [id number]  Unsuccessful telephone call attempt # 3 toMr. [place].Requested by [facility] CPhT, to make outreach attempt because [name] has not called to report that he received the [employer] letter as requested.  Plan:  If no response from [name], I will close his [place] case on 07/28/18.  Joetta Manners, PharmD Clinical Pharmacist [place] [phone]

## 2018-07-27 DIAGNOSIS — I130 Hypertensive heart and chronic kidney disease with heart failure and stage 1 through stage 4 chronic kidney disease, or unspecified chronic kidney disease: Secondary | ICD-10-CM | POA: Diagnosis not present

## 2018-07-27 DIAGNOSIS — E1122 Type 2 diabetes mellitus with diabetic chronic kidney disease: Secondary | ICD-10-CM | POA: Diagnosis not present

## 2018-07-27 DIAGNOSIS — N183 Chronic kidney disease, stage 3 (moderate): Secondary | ICD-10-CM | POA: Diagnosis not present

## 2018-07-27 DIAGNOSIS — I5032 Chronic diastolic (congestive) heart failure: Secondary | ICD-10-CM | POA: Diagnosis not present

## 2018-07-27 DIAGNOSIS — F319 Bipolar disorder, unspecified: Secondary | ICD-10-CM | POA: Diagnosis not present

## 2018-07-27 DIAGNOSIS — D631 Anemia in chronic kidney disease: Secondary | ICD-10-CM | POA: Diagnosis not present

## 2018-07-28 ENCOUNTER — Other Ambulatory Visit: Payer: Self-pay

## 2018-07-28 DIAGNOSIS — D631 Anemia in chronic kidney disease: Secondary | ICD-10-CM | POA: Diagnosis not present

## 2018-07-28 DIAGNOSIS — I5032 Chronic diastolic (congestive) heart failure: Secondary | ICD-10-CM | POA: Diagnosis not present

## 2018-07-28 DIAGNOSIS — F209 Schizophrenia, unspecified: Secondary | ICD-10-CM | POA: Diagnosis not present

## 2018-07-28 DIAGNOSIS — I130 Hypertensive heart and chronic kidney disease with heart failure and stage 1 through stage 4 chronic kidney disease, or unspecified chronic kidney disease: Secondary | ICD-10-CM | POA: Diagnosis not present

## 2018-07-28 DIAGNOSIS — N183 Chronic kidney disease, stage 3 (moderate): Secondary | ICD-10-CM | POA: Diagnosis not present

## 2018-07-28 DIAGNOSIS — F319 Bipolar disorder, unspecified: Secondary | ICD-10-CM | POA: Diagnosis not present

## 2018-07-28 DIAGNOSIS — E1122 Type 2 diabetes mellitus with diabetic chronic kidney disease: Secondary | ICD-10-CM | POA: Diagnosis not present

## 2018-07-28 NOTE — Patient Outreach (Addendum)
[place] [facility]) Care Management [place]  07/28/2018  Timothy Clark May 01, 1971 [id number]  Reason for call: final outreach attempt  [place] case is being closed due to the following reasons:  We have been unable to establish and/or maintain contact with the patient.  Patient has been provided [facility] CM contact information if assistance needed in the future.    Thank you for allowing [facility] pharmacy to be involved in this patient's care.    Joetta Manners, PharmD Clinical Pharmacist [place] [phone]  Addendum: Incoming call received from Mr. Cheese.  He states that he met with the pharmacist at Dr. Juel Burrow office and he will be transferring his prescriptions to [facility] in [place] and that they will help him with Januvia assistance.  Joetta Manners, PharmD Clinical Pharmacist [place] [phone]

## 2018-07-29 ENCOUNTER — Emergency Department (HOSPITAL_COMMUNITY)
Admission: EM | Admit: 2018-07-29 | Discharge: 2018-07-29 | Disposition: A | Discharge status: Home / Self Care | Payer: Medicare HMO | Attending: Emergency Medicine | Admitting: Emergency Medicine

## 2018-07-29 ENCOUNTER — Encounter (HOSPITAL_COMMUNITY): Payer: Self-pay | Admitting: Emergency Medicine

## 2018-07-29 ENCOUNTER — Other Ambulatory Visit: Payer: Self-pay

## 2018-07-29 DIAGNOSIS — N183 Chronic kidney disease, stage 3 (moderate): Secondary | ICD-10-CM | POA: Insufficient documentation

## 2018-07-29 DIAGNOSIS — R110 Nausea: Secondary | ICD-10-CM | POA: Diagnosis not present

## 2018-07-29 DIAGNOSIS — R1084 Generalized abdominal pain: Secondary | ICD-10-CM | POA: Diagnosis present

## 2018-07-29 DIAGNOSIS — I129 Hypertensive chronic kidney disease with stage 1 through stage 4 chronic kidney disease, or unspecified chronic kidney disease: Secondary | ICD-10-CM | POA: Diagnosis not present

## 2018-07-29 DIAGNOSIS — Z7984 Long term (current) use of oral hypoglycemic drugs: Secondary | ICD-10-CM | POA: Insufficient documentation

## 2018-07-29 DIAGNOSIS — E119 Type 2 diabetes mellitus without complications: Secondary | ICD-10-CM | POA: Diagnosis not present

## 2018-07-29 DIAGNOSIS — R112 Nausea with vomiting, unspecified: Secondary | ICD-10-CM | POA: Diagnosis not present

## 2018-07-29 DIAGNOSIS — R1114 Bilious vomiting: Secondary | ICD-10-CM

## 2018-07-29 DIAGNOSIS — Z79899 Other long term (current) drug therapy: Secondary | ICD-10-CM | POA: Insufficient documentation

## 2018-07-29 LAB — CBC
HCT: 26 % — ABNORMAL LOW (ref 39.0–52.0)
Hemoglobin: 8.5 g/dL — ABNORMAL LOW (ref 13.0–17.0)
MCH: 30.7 pg (ref 26.0–34.0)
MCHC: 32.7 g/dL (ref 30.0–36.0)
MCV: 93.9 fL (ref 80.0–100.0)
Platelets: 153 10*3/uL (ref 150–400)
RBC: 2.77 MIL/uL — ABNORMAL LOW (ref 4.22–5.81)
RDW: 12.3 % (ref 11.5–15.5)
WBC: 7 10*3/uL (ref 4.0–10.5)
nRBC: 0 % (ref 0.0–0.2)

## 2018-07-29 LAB — COMPREHENSIVE METABOLIC PANEL
ALT: 16 U/L (ref 0–44)
AST: 20 U/L (ref 15–41)
Albumin: 3.5 g/dL (ref 3.5–5.0)
Alkaline Phosphatase: 44 U/L (ref 38–126)
Anion gap: 6 (ref 5–15)
BUN: 50 mg/dL — ABNORMAL HIGH (ref 6–20)
CO2: 19 mmol/L — ABNORMAL LOW (ref 22–32)
Calcium: 9.1 mg/dL (ref 8.9–10.3)
Chloride: 114 mmol/L — ABNORMAL HIGH (ref 98–111)
Creatinine, Ser: 3.75 mg/dL — ABNORMAL HIGH (ref 0.61–1.24)
GFR calc Af Amer: 21 mL/min — ABNORMAL LOW (ref >60)
GFR calc non Af Amer: 18 mL/min — ABNORMAL LOW (ref >60)
Glucose, Bld: 131 mg/dL — ABNORMAL HIGH (ref 70–99)
Potassium: 4.3 mmol/L (ref 3.5–5.1)
Sodium: 139 mmol/L (ref 135–145)
Total Bilirubin: 0.6 mg/dL (ref 0.3–1.2)
Total Protein: 7.1 g/dL (ref 6.5–8.1)

## 2018-07-29 LAB — URINALYSIS, ROUTINE W REFLEX MICROSCOPIC
Bilirubin Urine: NEGATIVE
Glucose, UA: 150 mg/dL — AB
Hgb urine dipstick: NEGATIVE
Ketones, ur: NEGATIVE mg/dL
Leukocytes, UA: NEGATIVE
Nitrite: NEGATIVE
Protein, ur: >=300 mg/dL — AB
Specific Gravity, Urine: 1.017 (ref 1.005–1.030)
pH: 5 (ref 5.0–8.0)

## 2018-07-29 LAB — LIPASE, BLOOD: Lipase: 33 U/L (ref 11–51)

## 2018-07-29 MED ORDER — PROMETHAZINE HCL 25 MG/ML IJ SOLN
25.0000 mg | Freq: Once | INTRAMUSCULAR | Status: AC
  Administered 2018-07-29: 25 mg via INTRAMUSCULAR
  Filled 2018-07-29: qty 1

## 2018-07-29 MED ORDER — SUCRALFATE 1 GM/10ML PO SUSP
1.0000 g | Freq: Once | ORAL | Status: AC
  Administered 2018-07-29: 1 g via ORAL
  Filled 2018-07-29: qty 10

## 2018-07-29 MED ORDER — PROCHLORPERAZINE EDISYLATE 10 MG/2ML IJ SOLN
10.0000 mg | Freq: Once | INTRAMUSCULAR | Status: DC
  Filled 2018-07-29: qty 2

## 2018-07-29 MED ORDER — SODIUM CHLORIDE 0.9 % IV BOLUS: 1000.0000 mL | Freq: Once | INTRAVENOUS | Status: DC

## 2018-07-29 NOTE — ED Notes (Signed)
Date and time results received: 07/29/18 1907 (use smartphrase ".now" to insert current time)  Test: inr Critical Value: 4.18  Name of Provider Notified: [place]  Orders Received? Or Actions Taken?: see chart

## 2018-07-29 NOTE — Discharge Instructions (Addendum)
As discussed, your evaluation today has been largely reassuring.  But, it is important that you monitor your condition carefully, and do not hesitate to return to the ED if you develop new, or concerning changes in your condition.  Otherwise, please follow-up with your physician for appropriate ongoing care.  In the interim, please be sure to drink plenty of fluids.  You have been provided a new prescription for additional nausea control. You can use the suppository, if you are unable to tolerate oral medication.

## 2018-07-29 NOTE — ED Triage Notes (Signed)
Patient complains of vomiting x 4 times this morning. States history of gastroparesis.

## 2018-07-29 NOTE — ED Notes (Signed)
Pt with left eye swelling, seen PCP yesterday and was dx with "pink eye"

## 2018-07-29 NOTE — ED Notes (Signed)
Three nurses have attempted IV on patient, without any success, EDP [place] notified.

## 2018-07-29 NOTE — ED Provider Notes (Signed)
[facility] EMERGENCY DEPARTMENT Provider Note   CSN: [id number] Arrival date & time: 07/29/18  1401     History   Chief Complaint Chief Complaint  Patient presents with  . Emesis    HPI CORIE VAVRA is a 47 y.o. male.  HPI Patient presents with concern of abdominal pain, nausea, vomiting. Patient has multiple medical issues including chronic kidney disease, gastroparesis. This episode began about 2 days ago. Since that time he has had multiple episodes of vomiting, has persistent upper abdominal pain. Last vomiting about 6 hours ago, and he is actually tolerated oral [employer] since that time. No fever, no chills, no dyspnea, no syncope. No other abdominal pain. Patient has been intolerant of his home medication during this illness.  Past Medical History:  Diagnosis Date  . Chronic abdominal pain   . Esophagitis   . Eye hemorrhage   . Gastritis   . Gastroparesis   . GERD (gastroesophageal reflux disease)   . Glaucoma   . Helicobacter pylori (H. pylori) infection 2016   ERADICATION DOCUMENTED VIA STOOL TEST in AUG 2016 at [place]  . Hiccups   . Hypertension   . Nausea and vomiting    chronic, recurrent  . Type 2 diabetes mellitus with complications (HCC)    diagnosed around age 38    Patient Active Problem List   Diagnosis Date Noted  . HTN (hypertension), malignant 06/13/2018  . Scrotal swelling 11/10/2017  . Swelling 11/09/2017  . Scrotal edema   . Obesity, Class III, BMI 40-49.9 (morbid obesity) ([place]) 11/02/2017  . CKD stage 3 due to type 2 diabetes mellitus ([place]) 10/30/2017  . Wound eschars of foot, right  10/30/2017  . Chronic abdominal pain 09/21/2017  . Prolonged QT interval 09/21/2017  . GERD (gastroesophageal reflux disease) 07/14/2017  . Glaucoma 07/14/2017  . Anemia 07/14/2017  . Pressure injury of skin 02/01/2017  . Insulin dependent diabetes mellitus ([place]) 09/07/2016  . Orthostatic syncope 09/05/2016  . 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  Gastroparesis due to DM ([place]) 02/06/2015  . Essential hypertension 12/06/2014  . Diabetic macular edema ([place]) 02/18/2014  . PDR (proliferative diabetic retinopathy) ([place]) 02/06/2014  . Neovascular glaucoma of left eye 01/11/2014  . Diabetic infection of left heel ([place]) 02/14/2010  . UNSPECIFIED PERIPHERAL VASCULAR DISEASE 10/19/2009  . ERECTILE DYSFUNCTION, ORGANIC 10/19/2009  . NUMBNESS 07/20/2009  . Diabetes mellitus type 2 with complications, uncontrolled ([place]) 03/22/2009  . Hyperlipidemia 03/22/2009  . Depression 03/22/2009    Past Surgical History:  Procedure Laterality Date  . CATARACT EXTRACTION W/PHACO Left 05/19/2016   Procedure: CATARACT EXTRACTION PHACO AND INTRAOCULAR LENS PLACEMENT LEFT EYE;  Surgeon: Tonny Branch, MD;  Location: [facility];  Service: Ophthalmology;  Laterality: Left;  CDE: 7.30  . CATARACT EXTRACTION W/PHACO Right 06/23/2016   Procedure: CATARACT EXTRACTION PHACO AND INTRAOCULAR LENS PLACEMENT RIGHT EYE CDE=9.87;  Surgeon: Tonny Branch, MD;  Location: [facility];  Service: Ophthalmology;  Laterality: Right;  right  . ESOPHAGOGASTRODUODENOSCOPY  2015   Dr. Britta Mccreedy  . ESOPHAGOGASTRODUODENOSCOPY N/A 10/26/2014   RMR: Distal esophagititis-likely reflux related although an element of pill induced injuruy no exclueded  status post biopsy. Diffusely abnormal gastric mucosa of uncertain signigicane -status post gastric biopsy. Focal area of excoriation in the cardia most consistant with a trauma of heaving.   . ESOPHAGOGASTRODUODENOSCOPY  08/2015   [facility]: mild esophagitis and old gastric contents  . EYE SURGERY  2015   stent placed to left eye  . EYE SURGERY    .  INCISION AND DRAINAGE OF WOUND Right 07/16/2017   Procedure: IRRIGATION AND DEBRIDEMENT OF SOFT TISSUE OF ULCERATION RIGHT FOOT;  Surgeon: Caprice Beaver, DPM;  Location: [facility];  Service: Podiatry;  Laterality: Right;        Home Medications    Prior to Admission medications   Medication Sig Start Date  End Date Taking? Authorizing Provider  aMILoride (MIDAMOR) 5 MG tablet Take 1 tablet (5 mg total) by mouth daily. 06/17/18 07/17/18  Henreitta Leber, MD  atorvastatin (LIPITOR) 10 MG tablet Take 10 mg by mouth at bedtime.     [provider]  Blood Glucose Monitoring Suppl (PRODIGY AUTOCODE BLOOD GLUCOSE) w/Device KIT  04/16/18   [provider]  brimonidine (ALPHAGAN) 0.2 % ophthalmic solution Place 1 drop into both eyes 3 (three) times daily.     [provider]  carvedilol (COREG) 25 MG tablet Take 25 mg by mouth 2 (two) times daily with a meal.    [provider]  cloNIDine (CATAPRES) 0.2 MG tablet Take 0.4 mg by mouth 2 (two) times daily.     [provider]  cyanocobalamin (,VITAMIN B-12,) 1000 MCG/ML injection INJECT 1 ML (CC) INTRAMUSCULARLY ONCE A WEEK FOR 4 WEEKS THEN MONTHLY 04/19/18   [provider]  dorzolamide (TRUSOPT) 2 % ophthalmic solution INSTILL 1 DROPS INTO EACH EYE TWICE DAILY 04/23/18   [provider]  folic acid (FOLVITE) 1 MG tablet Take 1 mg by mouth daily.    [provider]  hydrALAZINE (APRESOLINE) 100 MG tablet Take 1 tablet (100 mg total) by mouth 3 (three) times daily. 06/16/18 07/16/18  Henreitta Leber, MD  insulin aspart protamine- aspart (NOVOLOG MIX 70/30) (70-30) 100 UNIT/ML injection Inject 0.25 mLs (25 Units total) into the skin 2 (two) times daily with a meal. 09/24/17   Georgette Shell, MD  lactulose (CEPHULAC) 10 g packet Take 1 packet (10 g total) by mouth every morning. 06/09/18   Nena Polio, MD  LORazepam (ATIVAN) 1 MG tablet Take 1 mg by mouth 2 (two) times daily as needed. for anxiety 05/07/18   [provider]  losartan (COZAAR) 25 MG tablet Take 1 tablet (25 mg total) by mouth daily. 06/17/18 07/17/18  Henreitta Leber, MD  pantoprazole (PROTONIX) 40 MG tablet Take 40 mg by mouth 2 (two) times daily.    [provider]  potassium chloride SA (K-DUR,KLOR-CON) 20  MEQ tablet Take 1 tablet (20 mEq total) by mouth 2 (two) times daily. 11/12/17   Caren Griffins, MD  promethazine (PHENERGAN) 25 MG tablet Take 0.5 tablets (12.5 mg total) by mouth every 6 (six) hours as needed for nausea or vomiting. To one tablet every 6 hours 02/24/18   Annitta Needs, NP  sitaGLIPtin (JANUVIA) 50 MG tablet Take 50 mg by mouth daily.    [provider]  sucralfate (CARAFATE) 1 g tablet Take 1 g by mouth 4 (four) times daily.     [provider]  timolol (TIMOPTIC) 0.5 % ophthalmic solution Place 1 drop into both eyes 2 (two) times daily.    [provider]  torsemide (DEMADEX) 20 MG tablet Take 1 tablet (20 mg total) by mouth daily. 06/16/18 07/16/18  Henreitta Leber, MD    Family History Family History  Problem Relation Age of Onset  . Ovarian cancer Mother   . Heart attack Father   . Cervical cancer Sister   . Diabetes Sister   . Colon cancer  Neg Hx     Social History Social History   Tobacco Use  . Smoking status: Never Smoker  . Smokeless tobacco: Never Used  Substance Use Topics  . Alcohol use: No    Alcohol/week: 0.0 standard drinks  . Drug use: No     Allergies   Donnatal [pb-hyoscy-atropine-scopolamine]; Fish allergy; Haldol [haloperidol lactate]; Lidocaine; Maalox [calcium carbonate antacid]; Marinol [dronabinol]; Aspirin; Bactrim [sulfamethoxazole-trimethoprim]; Doxycycline; Keflex [cephalexin]; and Penicillins   Review of Systems Review of Systems  Constitutional:       Per HPI, otherwise negative  HENT:       Per HPI, otherwise negative  Respiratory:       Per HPI, otherwise negative  Cardiovascular:       Per HPI, otherwise negative  Gastrointestinal: Positive for abdominal pain, nausea and vomiting.  Endocrine:       Negative aside from HPI  Genitourinary:       Neg aside from HPI   Musculoskeletal:       Per HPI, otherwise negative  Skin: Negative.   Neurological: Negative for syncope.     Physical  Exam Updated Vital Signs BP (!) 186/94   Pulse 77   Temp 98.6 F (37 C) (Temporal)   Resp 20   Ht 6' (1.829 m)   Wt 136.1 kg   SpO2 100%   BMI 40.69 kg/m   Physical Exam Vitals signs and nursing note reviewed.  Constitutional:      General: He is not in acute distress.    Appearance: He is well-developed.  HENT:     Head: Normocephalic and atraumatic.  Eyes:     Conjunctiva/sclera: Conjunctivae normal.  Cardiovascular:     Rate and Rhythm: Normal rate and regular rhythm.     Pulses: Normal pulses.  Pulmonary:     Effort: Pulmonary effort is normal. No respiratory distress.     Breath sounds: No stridor.  Abdominal:     General: There is no distension.     Tenderness: There is abdominal tenderness in the epigastric area.    Skin:    General: Skin is warm and dry.  Neurological:     Mental Status: He is alert and oriented to person, place, and time.      ED Treatments / Results  Labs (all labs ordered are listed, but only abnormal results are displayed) Labs Reviewed  COMPREHENSIVE METABOLIC PANEL - Abnormal; Notable for the following components:      Result Value   Chloride 114 (*)    CO2 19 (*)    Glucose, Bld 131 (*)    BUN 50 (*)    Creatinine, Ser 3.75 (*)    GFR calc non Af Amer 18 (*)    GFR calc Af Amer 21 (*)    All other components within normal limits  CBC - Abnormal; Notable for the following components:   RBC 2.77 (*)    Hemoglobin 8.5 (*)    HCT 26.0 (*)    All other components within normal limits  URINALYSIS, ROUTINE W REFLEX MICROSCOPIC - Abnormal; Notable for the following components:   APPearance HAZY (*)    Glucose, UA 150 (*)    Protein, ur >=300 (*)    Bacteria, UA RARE (*)    All other components within normal limits  LIPASE, BLOOD   Procedures Procedures (including critical care time)  Medications Ordered in ED Medications  sucralfate (CARAFATE) 1 GM/10ML suspension 1 g (1 g Oral Given 07/29/18 2013)  promethazine  (PHENERGAN) injection 25 mg (25 mg Intramuscular Given 07/29/18 2124)     Initial Impression / Assessment and Plan / ED Course  I have reviewed the triage vital signs and the nursing notes.  Pertinent labs & imaging results that were available during my care of the patient were reviewed by me and considered in my medical decision making (see chart for details).     9:30 PM Labs notable for slight worsening of his chronic kidney disease. On repeat exam the patient remains awake and alert, has had no vomiting now in almost 9 hours, has tolerated oral intake. He is received IM Phenergan, after IV access was not able to be obtained. With demonstration of ability to tolerate oral intake, no evidence for hypotension, some suspicion for mild dehydration, but no ketonuria, no evidence for DKA, the patient is appropriate for oral rehydration, as an outpatient. Patient encouraged to follow-up with primary care.  Final Clinical Impressions(s) / ED Diagnoses  Nausea and vomiting   Carmin Muskrat, MD 07/29/18 2130

## 2018-07-30 DIAGNOSIS — E118 Type 2 diabetes mellitus with unspecified complications: Secondary | ICD-10-CM | POA: Diagnosis not present

## 2018-07-30 DIAGNOSIS — F05 Delirium due to known physiological condition: Secondary | ICD-10-CM | POA: Diagnosis not present

## 2018-07-30 DIAGNOSIS — F209 Schizophrenia, unspecified: Secondary | ICD-10-CM | POA: Diagnosis not present

## 2018-07-30 DIAGNOSIS — Z79899 Other long term (current) drug therapy: Secondary | ICD-10-CM | POA: Diagnosis not present

## 2018-07-30 DIAGNOSIS — I129 Hypertensive chronic kidney disease with stage 1 through stage 4 chronic kidney disease, or unspecified chronic kidney disease: Secondary | ICD-10-CM | POA: Diagnosis not present

## 2018-07-30 DIAGNOSIS — H548 Legal blindness, as defined in USA: Secondary | ICD-10-CM | POA: Diagnosis not present

## 2018-07-30 DIAGNOSIS — E1122 Type 2 diabetes mellitus with diabetic chronic kidney disease: Secondary | ICD-10-CM | POA: Diagnosis not present

## 2018-07-30 DIAGNOSIS — N183 Chronic kidney disease, stage 3 (moderate): Secondary | ICD-10-CM | POA: Diagnosis not present

## 2018-07-30 DIAGNOSIS — M47814 Spondylosis without myelopathy or radiculopathy, thoracic region: Secondary | ICD-10-CM | POA: Diagnosis not present

## 2018-07-30 DIAGNOSIS — M86179 Other acute osteomyelitis, unspecified ankle and foot: Secondary | ICD-10-CM | POA: Diagnosis not present

## 2018-07-30 DIAGNOSIS — I509 Heart failure, unspecified: Secondary | ICD-10-CM | POA: Diagnosis not present

## 2018-07-30 DIAGNOSIS — R109 Unspecified abdominal pain: Secondary | ICD-10-CM | POA: Diagnosis not present

## 2018-07-30 DIAGNOSIS — Z794 Long term (current) use of insulin: Secondary | ICD-10-CM | POA: Diagnosis not present

## 2018-07-30 DIAGNOSIS — N189 Chronic kidney disease, unspecified: Secondary | ICD-10-CM | POA: Diagnosis not present

## 2018-07-30 DIAGNOSIS — R1084 Generalized abdominal pain: Secondary | ICD-10-CM | POA: Diagnosis not present

## 2018-07-30 DIAGNOSIS — N179 Acute kidney failure, unspecified: Secondary | ICD-10-CM | POA: Diagnosis not present

## 2018-07-30 DIAGNOSIS — R1110 Vomiting, unspecified: Secondary | ICD-10-CM | POA: Diagnosis not present

## 2018-07-30 DIAGNOSIS — R0989 Other specified symptoms and signs involving the circulatory and respiratory systems: Secondary | ICD-10-CM | POA: Diagnosis not present

## 2018-08-01 DIAGNOSIS — N179 Acute kidney failure, unspecified: Secondary | ICD-10-CM | POA: Diagnosis not present

## 2018-08-01 DIAGNOSIS — N189 Chronic kidney disease, unspecified: Secondary | ICD-10-CM | POA: Diagnosis not present

## 2018-08-01 DIAGNOSIS — F05 Delirium due to known physiological condition: Secondary | ICD-10-CM | POA: Diagnosis not present

## 2018-08-05 DIAGNOSIS — F319 Bipolar disorder, unspecified: Secondary | ICD-10-CM | POA: Diagnosis not present

## 2018-08-05 DIAGNOSIS — I5032 Chronic diastolic (congestive) heart failure: Secondary | ICD-10-CM | POA: Diagnosis not present

## 2018-08-05 DIAGNOSIS — E1122 Type 2 diabetes mellitus with diabetic chronic kidney disease: Secondary | ICD-10-CM | POA: Diagnosis not present

## 2018-08-05 DIAGNOSIS — I130 Hypertensive heart and chronic kidney disease with heart failure and stage 1 through stage 4 chronic kidney disease, or unspecified chronic kidney disease: Secondary | ICD-10-CM | POA: Diagnosis not present

## 2018-08-05 DIAGNOSIS — N183 Chronic kidney disease, stage 3 (moderate): Secondary | ICD-10-CM | POA: Diagnosis not present

## 2018-08-05 DIAGNOSIS — D631 Anemia in chronic kidney disease: Secondary | ICD-10-CM | POA: Diagnosis not present

## 2018-08-10 DIAGNOSIS — N183 Chronic kidney disease, stage 3 (moderate): Secondary | ICD-10-CM | POA: Diagnosis not present

## 2018-08-10 DIAGNOSIS — I130 Hypertensive heart and chronic kidney disease with heart failure and stage 1 through stage 4 chronic kidney disease, or unspecified chronic kidney disease: Secondary | ICD-10-CM | POA: Diagnosis not present

## 2018-08-10 DIAGNOSIS — D631 Anemia in chronic kidney disease: Secondary | ICD-10-CM | POA: Diagnosis not present

## 2018-08-10 DIAGNOSIS — E1122 Type 2 diabetes mellitus with diabetic chronic kidney disease: Secondary | ICD-10-CM | POA: Diagnosis not present

## 2018-08-10 DIAGNOSIS — F319 Bipolar disorder, unspecified: Secondary | ICD-10-CM | POA: Diagnosis not present

## 2018-08-10 DIAGNOSIS — I5032 Chronic diastolic (congestive) heart failure: Secondary | ICD-10-CM | POA: Diagnosis not present

## 2018-08-12 DIAGNOSIS — R609 Edema, unspecified: Secondary | ICD-10-CM | POA: Diagnosis not present

## 2018-08-12 DIAGNOSIS — R600 Localized edema: Secondary | ICD-10-CM | POA: Diagnosis not present

## 2018-08-12 DIAGNOSIS — N179 Acute kidney failure, unspecified: Secondary | ICD-10-CM | POA: Diagnosis not present

## 2018-08-12 DIAGNOSIS — E875 Hyperkalemia: Secondary | ICD-10-CM | POA: Diagnosis not present

## 2018-08-12 DIAGNOSIS — E1122 Type 2 diabetes mellitus with diabetic chronic kidney disease: Secondary | ICD-10-CM | POA: Diagnosis not present

## 2018-08-12 DIAGNOSIS — N183 Chronic kidney disease, stage 3 (moderate): Secondary | ICD-10-CM | POA: Diagnosis not present

## 2018-08-13 DIAGNOSIS — D631 Anemia in chronic kidney disease: Secondary | ICD-10-CM | POA: Diagnosis not present

## 2018-08-13 DIAGNOSIS — I130 Hypertensive heart and chronic kidney disease with heart failure and stage 1 through stage 4 chronic kidney disease, or unspecified chronic kidney disease: Secondary | ICD-10-CM | POA: Diagnosis not present

## 2018-08-13 DIAGNOSIS — E1143 Type 2 diabetes mellitus with diabetic autonomic (poly)neuropathy: Secondary | ICD-10-CM | POA: Diagnosis not present

## 2018-08-13 DIAGNOSIS — I5032 Chronic diastolic (congestive) heart failure: Secondary | ICD-10-CM | POA: Diagnosis not present

## 2018-08-13 DIAGNOSIS — F209 Schizophrenia, unspecified: Secondary | ICD-10-CM | POA: Diagnosis not present

## 2018-08-13 DIAGNOSIS — F319 Bipolar disorder, unspecified: Secondary | ICD-10-CM | POA: Diagnosis not present

## 2018-08-13 DIAGNOSIS — E1122 Type 2 diabetes mellitus with diabetic chronic kidney disease: Secondary | ICD-10-CM | POA: Diagnosis not present

## 2018-08-13 DIAGNOSIS — E11621 Type 2 diabetes mellitus with foot ulcer: Secondary | ICD-10-CM | POA: Diagnosis not present

## 2018-08-13 DIAGNOSIS — N183 Chronic kidney disease, stage 3 (moderate): Secondary | ICD-10-CM | POA: Diagnosis not present

## 2018-08-17 DIAGNOSIS — D631 Anemia in chronic kidney disease: Secondary | ICD-10-CM | POA: Diagnosis not present

## 2018-08-17 DIAGNOSIS — N183 Chronic kidney disease, stage 3 (moderate): Secondary | ICD-10-CM | POA: Diagnosis not present

## 2018-08-17 DIAGNOSIS — F319 Bipolar disorder, unspecified: Secondary | ICD-10-CM | POA: Diagnosis not present

## 2018-08-17 DIAGNOSIS — I130 Hypertensive heart and chronic kidney disease with heart failure and stage 1 through stage 4 chronic kidney disease, or unspecified chronic kidney disease: Secondary | ICD-10-CM | POA: Diagnosis not present

## 2018-08-17 DIAGNOSIS — I5032 Chronic diastolic (congestive) heart failure: Secondary | ICD-10-CM | POA: Diagnosis not present

## 2018-08-17 DIAGNOSIS — E1122 Type 2 diabetes mellitus with diabetic chronic kidney disease: Secondary | ICD-10-CM | POA: Diagnosis not present

## 2018-08-24 ENCOUNTER — Telehealth: Payer: Self-pay | Admitting: Internal Medicine

## 2018-08-24 DIAGNOSIS — N183 Chronic kidney disease, stage 3 (moderate): Secondary | ICD-10-CM | POA: Diagnosis not present

## 2018-08-24 DIAGNOSIS — F319 Bipolar disorder, unspecified: Secondary | ICD-10-CM | POA: Diagnosis not present

## 2018-08-24 DIAGNOSIS — E1122 Type 2 diabetes mellitus with diabetic chronic kidney disease: Secondary | ICD-10-CM | POA: Diagnosis not present

## 2018-08-24 DIAGNOSIS — D631 Anemia in chronic kidney disease: Secondary | ICD-10-CM | POA: Diagnosis not present

## 2018-08-24 DIAGNOSIS — I130 Hypertensive heart and chronic kidney disease with heart failure and stage 1 through stage 4 chronic kidney disease, or unspecified chronic kidney disease: Secondary | ICD-10-CM | POA: Diagnosis not present

## 2018-08-24 DIAGNOSIS — I5032 Chronic diastolic (congestive) heart failure: Secondary | ICD-10-CM | POA: Diagnosis not present

## 2018-08-24 MED ORDER — PANTOPRAZOLE SODIUM 40 MG PO TBEC: 40.0000 mg | DELAYED_RELEASE_TABLET | Freq: Two times a day (BID) | ORAL | 3 refills | Status: DC

## 2018-08-24 NOTE — Telephone Encounter (Signed)
Completed.

## 2018-08-24 NOTE — Telephone Encounter (Signed)
Someone from Dr Juel Burrow office called to let us know that the patient has a new pharmacy and needs a refill on his pantoprazole. His new pharmacy in call [facility] and their number is [phone] and the fax is [phone]

## 2018-08-24 NOTE — Telephone Encounter (Signed)
Pts last ov was 05/2018 and he needs his RX called into his new pharmacy [facility]. Number is [phone] and the fax is [phone]

## 2018-08-25 ENCOUNTER — Other Ambulatory Visit: Payer: Self-pay

## 2018-08-26 MED ORDER — PROMETHAZINE HCL 25 MG PO TABS: 12.5000 mg | ORAL_TABLET | Freq: Three times a day (TID) | ORAL | 1 refills | Status: DC | PRN

## 2018-08-30 DIAGNOSIS — E118 Type 2 diabetes mellitus with unspecified complications: Secondary | ICD-10-CM | POA: Diagnosis not present

## 2018-08-30 DIAGNOSIS — N183 Chronic kidney disease, stage 3 (moderate): Secondary | ICD-10-CM | POA: Diagnosis not present

## 2018-08-30 DIAGNOSIS — H548 Legal blindness, as defined in USA: Secondary | ICD-10-CM | POA: Diagnosis not present

## 2018-08-30 DIAGNOSIS — I509 Heart failure, unspecified: Secondary | ICD-10-CM | POA: Diagnosis not present

## 2018-08-30 DIAGNOSIS — M86179 Other acute osteomyelitis, unspecified ankle and foot: Secondary | ICD-10-CM | POA: Diagnosis not present

## 2018-08-30 DIAGNOSIS — E1122 Type 2 diabetes mellitus with diabetic chronic kidney disease: Secondary | ICD-10-CM | POA: Diagnosis not present

## 2018-08-31 DIAGNOSIS — F319 Bipolar disorder, unspecified: Secondary | ICD-10-CM | POA: Diagnosis not present

## 2018-08-31 DIAGNOSIS — D631 Anemia in chronic kidney disease: Secondary | ICD-10-CM | POA: Diagnosis not present

## 2018-08-31 DIAGNOSIS — N183 Chronic kidney disease, stage 3 (moderate): Secondary | ICD-10-CM | POA: Diagnosis not present

## 2018-08-31 DIAGNOSIS — I5032 Chronic diastolic (congestive) heart failure: Secondary | ICD-10-CM | POA: Diagnosis not present

## 2018-08-31 DIAGNOSIS — E1122 Type 2 diabetes mellitus with diabetic chronic kidney disease: Secondary | ICD-10-CM | POA: Diagnosis not present

## 2018-08-31 DIAGNOSIS — I130 Hypertensive heart and chronic kidney disease with heart failure and stage 1 through stage 4 chronic kidney disease, or unspecified chronic kidney disease: Secondary | ICD-10-CM | POA: Diagnosis not present

## 2018-09-03 ENCOUNTER — Ambulatory Visit: Payer: Medicare HMO | Admitting: Gastroenterology

## 2018-09-03 ENCOUNTER — Encounter: Payer: Self-pay | Admitting: Gastroenterology

## 2018-09-03 VITALS — BP 160/86 | HR 74 | Temp 98.5°F | Ht 72.0 in | Wt 307.0 lb

## 2018-09-03 DIAGNOSIS — E1143 Type 2 diabetes mellitus with diabetic autonomic (poly)neuropathy: Secondary | ICD-10-CM | POA: Diagnosis not present

## 2018-09-03 DIAGNOSIS — K3184 Gastroparesis: Secondary | ICD-10-CM | POA: Diagnosis not present

## 2018-09-03 MED ORDER — METOCLOPRAMIDE HCL 5 MG PO TABS: 5.0000 mg | ORAL_TABLET | Freq: Two times a day (BID) | ORAL | 3 refills | Status: DC

## 2018-09-03 NOTE — Patient Instructions (Signed)
I have sent in Reglan to your pharmacy to take at bedtime. You may also take this in the morning. If you start having diarrhea, only take it at bedtime.   Continue Protonix twice a day, 30 minutes before meals.  I will see you back in 3 months!  I enjoyed seeing you again today! As you know, I value our relationship and want to provide genuine, compassionate, and quality care. I welcome your feedback. If you receive a survey regarding your visit,  I greatly appreciate you taking time to fill this out. See you next time!  Annitta Needs, PhD, ANP-BC [facility]

## 2018-09-03 NOTE — Assessment & Plan Note (Signed)
Obtain EGD from [facility] from 2018. Reglan sent to [facility] to take at night and in the morning. Monitor for diarrhea. Continue PPI BID. Return in 3 months.

## 2018-09-03 NOTE — Progress Notes (Signed)
Referring Provider: [name], MD Primary Care Physician:  [name], MD Primary GI: Dr. Gala Romney   Chief Complaint  Patient presents with  . gastroparesis    f/u. Last went to ER in December. Occas abd pain  . Nausea    no vomiting     HPI:   Timothy Clark is a 48 y.o. male presenting today with a history of chronic abdominal pain, esophagitis, gastritis, gastroparesis, history of H.pylori s/p eradication. He needs initial screening colonoscopy at some point. EGD at [facility] Jan 2017 with mild esophagitis and old gastric contents.RUQ ultrasound March 2019: normal.HIDA last in May 2016: EF 92%.CT abd/pelvis with contrast in Feb 2019 at [facility]: unremarkable.Recent CT Nov 2019 with fatty liver, gallbladder sludge.   Historically has noted improvement in nausea with controlled BP. Had to be taken off Losartan due to renal disease. No rectal bleeding. No pain after eating. Nausea improved but mainly in mornings. No vomiting since December. Eating small meals throughout the day. Retaining fluid but much improved from when previously seen. Scrotal edema much improved. Sees nephrology in [place]. [facility].   States intermittent pain in upper abdomen twice a week. Always in the morning time. Associated vomiting. Will throw up undigested food and feel some improvement. Usually sends to emergency room. Loves the impossible whoppers from [employer]. Protonix BID. Taking Reglan as needed. Wasn't taking  twice a day due to diarrhea.   Drifting Hgb. Normocytic anemia in setting of renal disease. Unable to proceed with colonoscopy right now due to BP issues and needs eye surgery.   Past Medical History:  Diagnosis Date  . Chronic abdominal pain   . Esophagitis   . Eye hemorrhage   . Gastritis   . Gastroparesis   . GERD (gastroesophageal reflux disease)   . Glaucoma   . Helicobacter pylori (H. pylori) infection 2016   ERADICATION DOCUMENTED VIA STOOL TEST in AUG 2016  at [place]  . Hiccups   . Hypertension   . Nausea and vomiting    chronic, recurrent  . Type 2 diabetes mellitus with complications (HCC)    diagnosed around age 69    Past Surgical History:  Procedure Laterality Date  . CATARACT EXTRACTION W/PHACO Left 05/19/2016   Procedure: CATARACT EXTRACTION PHACO AND INTRAOCULAR LENS PLACEMENT LEFT EYE;  Surgeon: Tonny Branch, MD;  Location: [facility];  Service: Ophthalmology;  Laterality: Left;  CDE: 7.30  . CATARACT EXTRACTION W/PHACO Right 06/23/2016   Procedure: CATARACT EXTRACTION PHACO AND INTRAOCULAR LENS PLACEMENT RIGHT EYE CDE=9.87;  Surgeon: Tonny Branch, MD;  Location: [facility];  Service: Ophthalmology;  Laterality: Right;  right  . ESOPHAGOGASTRODUODENOSCOPY  2015   Dr. Britta Mccreedy  . ESOPHAGOGASTRODUODENOSCOPY N/A 10/26/2014   RMR: Distal esophagititis-likely reflux related although an element of pill induced injuruy no exclueded  status post biopsy. Diffusely abnormal gastric mucosa of uncertain signigicane -status post gastric biopsy. Focal area of excoriation in the cardia most consistant with a trauma of heaving.   . ESOPHAGOGASTRODUODENOSCOPY  08/2015   [facility]: mild esophagitis and old gastric contents  . EYE SURGERY  2015   stent placed to left eye  . EYE SURGERY    . INCISION AND DRAINAGE OF WOUND Right 07/16/2017   Procedure: IRRIGATION AND DEBRIDEMENT OF SOFT TISSUE OF ULCERATION RIGHT FOOT;  Surgeon: Caprice Beaver, DPM;  Location: [facility];  Service: Podiatry;  Laterality: Right;    Current Outpatient Medications  Medication Sig Dispense Refill  . atorvastatin (  LIPITOR) 10 MG tablet Take 10 mg by mouth at bedtime.     . Blood Glucose Monitoring Suppl (PRODIGY AUTOCODE BLOOD GLUCOSE) w/Device KIT     . brimonidine (ALPHAGAN) 0.2 % ophthalmic solution Place 1 drop into both eyes 3 (three) times daily.     . carvedilol (COREG) 25 MG tablet Take 25 mg by mouth 2 (two) times daily with a meal.    . cloNIDine (CATAPRES) 0.2 MG tablet Take  0.4 mg by mouth 2 (two) times daily.     . cyanocobalamin (,VITAMIN B-12,) 1000 MCG/ML injection INJECT 1 ML (CC) INTRAMUSCULARLY ONCE A WEEK FOR 4 WEEKS THEN MONTHLY  1  . dorzolamide (TRUSOPT) 2 % ophthalmic solution INSTILL 1 DROPS INTO EACH EYE TWICE DAILY  11  . folic acid (FOLVITE) 1 MG tablet Take 1 mg by mouth daily.    . hydrALAZINE (APRESOLINE) 100 MG tablet Take 1 tablet (100 mg total) by mouth 3 (three) times daily. 90 tablet 0  . insulin aspart protamine- aspart (NOVOLOG MIX 70/30) (70-30) 100 UNIT/ML injection Inject 0.25 mLs (25 Units total) into the skin 2 (two) times daily with a meal. 10 mL 11  . lactulose (CEPHULAC) 10 g packet Take 1 packet (10 g total) by mouth every morning. 30 each 0  . LORazepam (ATIVAN) 1 MG tablet Take 1 mg by mouth 2 (two) times daily as needed. for anxiety  3  . pantoprazole (PROTONIX) 40 MG tablet Take 1 tablet (40 mg total) by mouth 2 (two) times daily before a meal. 180 tablet 3  . potassium chloride SA (K-DUR,KLOR-CON) 20 MEQ tablet Take 1 tablet (20 mEq total) by mouth 2 (two) times daily. (Patient taking differently: Take 20 mEq by mouth daily. ) 20 tablet 0  . promethazine (PHENERGAN) 25 MG tablet Take 0.5 tablets (12.5 mg total) by mouth every 8 (eight) hours as needed for nausea or vomiting. To one tablet every 6 hours 40 tablet 1  . sitaGLIPtin (JANUVIA) 50 MG tablet Take 50 mg by mouth daily.    . sucralfate (CARAFATE) 1 g tablet Take 1 g by mouth 4 (four) times daily.     . timolol (TIMOPTIC) 0.5 % ophthalmic solution Place 1 drop into both eyes 2 (two) times daily.    [name] torsemide (DEMADEX) 20 MG tablet Take 1 tablet (20 mg total) by mouth daily. (Patient taking differently: Take 40 mg by mouth daily. ) 30 tablet 0  . aMILoride (MIDAMOR) 5 MG tablet Take 1 tablet (5 mg total) by mouth daily. (Patient not taking: Reported on 09/03/2018) 30 tablet 0  . losartan (COZAAR) 25 MG tablet Take 1 tablet (25 mg total) by mouth daily. (Patient not taking:  Reported on 09/03/2018) 30 tablet 0   No current facility-administered medications for this visit.     Allergies as of 09/03/2018 - Review Complete 09/03/2018  Allergen Reaction Noted  . Donnatal [pb-hyoscy-atropine-scopolamine] Anaphylaxis 07/12/2015  . Fish allergy Anaphylaxis, Hives, and Rash 01/14/2015  . Haldol [haloperidol lactate] Other (See Comments) 08/07/2015  . Lidocaine Anaphylaxis 07/12/2015  . Maalox [calcium carbonate antacid] Anaphylaxis 07/12/2015  . Marinol [dronabinol]  05/28/2018  . Aspirin Hives and Itching 03/22/2009  . Bactrim [sulfamethoxazole-trimethoprim] Itching 12/06/2014  . Doxycycline Hives and Nausea And Vomiting 03/20/2014  . Keflex [cephalexin] Hives, Itching, and Nausea And Vomiting 06/22/2017  . Penicillins Hives and Itching 03/22/2009    Family History  Problem Relation Age of Onset  . Ovarian cancer Mother   . Heart attack  Father   . Cervical cancer Sister   . Diabetes Sister   . Colon cancer Neg Hx     Social History   Socioeconomic History  . Marital status: Single    Spouse name: Not on file  . Number of children: Not on file  . Years of education: Not on file  . Highest education level: Not on file  Occupational History  . Not on file  Social Needs  . Financial resource strain: Not on file  . Food insecurity:    Worry: Not on file    Inability: Not on file  . Transportation needs:    Medical: Not on file    Non-medical: Not on file  Tobacco Use  . Smoking status: Never Smoker  . Smokeless tobacco: Never Used  Substance and Sexual Activity  . Alcohol use: No    Alcohol/week: 0.0 standard drinks  . Drug use: No  . Sexual activity: Yes  Lifestyle  . Physical activity:    Days per week: Not on file    Minutes per session: Not on file  . Stress: Not on file  Relationships  . Social connections:    Talks on phone: Not on file    Gets together: Not on file    Attends religious service: Not on file    Active member of  club or organization: Not on file    Attends meetings of clubs or organizations: Not on file    Relationship status: Not on file  Other Topics Concern  . Not on file  Social History Narrative  . Not on file    Review of Systems: Gen: Denies fever, chills, anorexia. Denies fatigue, weakness, weight loss.  CV: Denies chest pain, palpitations, syncope, peripheral edema, and claudication. Resp: Denies dyspnea at rest, cough, wheezing, coughing up blood, and pleurisy. GI: see HPI Derm: Denies rash, itching, dry skin Psych: Denies depression, anxiety, memory loss, confusion. No homicidal or suicidal ideation.  Heme: Denies bruising, bleeding, and enlarged lymph nodes.  Physical Exam: BP (!) 160/86   Pulse 74   Temp 98.5 F (36.9 C) (Oral)   Ht 6' (1.829 m)   Wt (!) 307 lb (139.3 kg)   BMI 41.64 kg/m  General:   Alert and oriented. No distress noted. Pleasant and cooperative.  Head:  Normocephalic and atraumatic. Eyes:  Conjuctiva clear without scleral icterus. Mouth:  Oral mucosa pink and moist.  Abdomen:  +BS, soft, non-tender and non-distended. No rebound or guarding. No HSM or masses noted. Msk:  Symmetrical without gross deformities.  Extremities:  Chronic lower extremity edema 3+ Neurologic:  Alert and  oriented x4 Psych:  Alert and cooperative. Normal mood and affect.

## 2018-09-10 DIAGNOSIS — D509 Iron deficiency anemia, unspecified: Secondary | ICD-10-CM | POA: Diagnosis not present

## 2018-09-10 DIAGNOSIS — E785 Hyperlipidemia, unspecified: Secondary | ICD-10-CM | POA: Diagnosis not present

## 2018-09-10 DIAGNOSIS — I10 Essential (primary) hypertension: Secondary | ICD-10-CM | POA: Diagnosis not present

## 2018-09-10 DIAGNOSIS — D519 Vitamin B12 deficiency anemia, unspecified: Secondary | ICD-10-CM | POA: Diagnosis not present

## 2018-09-10 DIAGNOSIS — E1122 Type 2 diabetes mellitus with diabetic chronic kidney disease: Secondary | ICD-10-CM | POA: Diagnosis not present

## 2018-09-13 DIAGNOSIS — Z961 Presence of intraocular lens: Secondary | ICD-10-CM | POA: Diagnosis not present

## 2018-09-13 DIAGNOSIS — E113593 Type 2 diabetes mellitus with proliferative diabetic retinopathy without macular edema, bilateral: Secondary | ICD-10-CM | POA: Diagnosis not present

## 2018-09-13 DIAGNOSIS — H401133 Primary open-angle glaucoma, bilateral, severe stage: Secondary | ICD-10-CM | POA: Diagnosis not present

## 2018-09-13 NOTE — Progress Notes (Signed)
CC'D TO PCP °

## 2018-09-20 DIAGNOSIS — E1122 Type 2 diabetes mellitus with diabetic chronic kidney disease: Secondary | ICD-10-CM | POA: Diagnosis not present

## 2018-09-20 DIAGNOSIS — N183 Chronic kidney disease, stage 3 (moderate): Secondary | ICD-10-CM | POA: Diagnosis not present

## 2018-09-20 DIAGNOSIS — I130 Hypertensive heart and chronic kidney disease with heart failure and stage 1 through stage 4 chronic kidney disease, or unspecified chronic kidney disease: Secondary | ICD-10-CM | POA: Diagnosis not present

## 2018-09-20 DIAGNOSIS — E782 Mixed hyperlipidemia: Secondary | ICD-10-CM | POA: Diagnosis not present

## 2018-09-20 DIAGNOSIS — E1165 Type 2 diabetes mellitus with hyperglycemia: Secondary | ICD-10-CM | POA: Diagnosis not present

## 2018-09-20 DIAGNOSIS — D631 Anemia in chronic kidney disease: Secondary | ICD-10-CM | POA: Diagnosis not present

## 2018-09-20 DIAGNOSIS — I10 Essential (primary) hypertension: Secondary | ICD-10-CM | POA: Diagnosis not present

## 2018-09-20 DIAGNOSIS — E785 Hyperlipidemia, unspecified: Secondary | ICD-10-CM | POA: Diagnosis not present

## 2018-09-21 DIAGNOSIS — N184 Chronic kidney disease, stage 4 (severe): Secondary | ICD-10-CM | POA: Diagnosis not present

## 2018-09-21 DIAGNOSIS — R600 Localized edema: Secondary | ICD-10-CM | POA: Diagnosis not present

## 2018-09-21 DIAGNOSIS — I129 Hypertensive chronic kidney disease with stage 1 through stage 4 chronic kidney disease, or unspecified chronic kidney disease: Secondary | ICD-10-CM | POA: Diagnosis not present

## 2018-09-21 DIAGNOSIS — N183 Chronic kidney disease, stage 3 (moderate): Secondary | ICD-10-CM | POA: Diagnosis not present

## 2018-09-21 DIAGNOSIS — N179 Acute kidney failure, unspecified: Secondary | ICD-10-CM | POA: Diagnosis not present

## 2018-09-21 DIAGNOSIS — E1129 Type 2 diabetes mellitus with other diabetic kidney complication: Secondary | ICD-10-CM | POA: Diagnosis not present

## 2018-09-21 DIAGNOSIS — E1122 Type 2 diabetes mellitus with diabetic chronic kidney disease: Secondary | ICD-10-CM | POA: Diagnosis not present

## 2018-09-23 DIAGNOSIS — E113591 Type 2 diabetes mellitus with proliferative diabetic retinopathy without macular edema, right eye: Secondary | ICD-10-CM | POA: Diagnosis not present

## 2018-09-23 DIAGNOSIS — H4051X3 Glaucoma secondary to other eye disorders, right eye, severe stage: Secondary | ICD-10-CM | POA: Diagnosis not present

## 2018-09-23 DIAGNOSIS — H35372 Puckering of macula, left eye: Secondary | ICD-10-CM | POA: Diagnosis not present

## 2018-09-23 DIAGNOSIS — E113512 Type 2 diabetes mellitus with proliferative diabetic retinopathy with macular edema, left eye: Secondary | ICD-10-CM | POA: Diagnosis not present

## 2018-09-23 DIAGNOSIS — H4312 Vitreous hemorrhage, left eye: Secondary | ICD-10-CM | POA: Diagnosis not present

## 2018-09-27 DIAGNOSIS — Z0100 Encounter for examination of eyes and vision without abnormal findings: Secondary | ICD-10-CM | POA: Diagnosis not present

## 2018-09-27 DIAGNOSIS — H524 Presbyopia: Secondary | ICD-10-CM | POA: Diagnosis not present

## 2018-09-30 DIAGNOSIS — E1122 Type 2 diabetes mellitus with diabetic chronic kidney disease: Secondary | ICD-10-CM | POA: Diagnosis not present

## 2018-09-30 DIAGNOSIS — H548 Legal blindness, as defined in USA: Secondary | ICD-10-CM | POA: Diagnosis not present

## 2018-09-30 DIAGNOSIS — N183 Chronic kidney disease, stage 3 (moderate): Secondary | ICD-10-CM | POA: Diagnosis not present

## 2018-09-30 DIAGNOSIS — I509 Heart failure, unspecified: Secondary | ICD-10-CM | POA: Diagnosis not present

## 2018-09-30 DIAGNOSIS — E118 Type 2 diabetes mellitus with unspecified complications: Secondary | ICD-10-CM | POA: Diagnosis not present

## 2018-09-30 DIAGNOSIS — M86179 Other acute osteomyelitis, unspecified ankle and foot: Secondary | ICD-10-CM | POA: Diagnosis not present

## 2018-10-06 ENCOUNTER — Ambulatory Visit: Payer: Medicare HMO | Admitting: Podiatry

## 2018-10-13 DIAGNOSIS — E1143 Type 2 diabetes mellitus with diabetic autonomic (poly)neuropathy: Secondary | ICD-10-CM | POA: Diagnosis not present

## 2018-10-13 DIAGNOSIS — I10 Essential (primary) hypertension: Secondary | ICD-10-CM | POA: Diagnosis not present

## 2018-10-13 DIAGNOSIS — Z884 Allergy status to anesthetic agent status: Secondary | ICD-10-CM | POA: Diagnosis not present

## 2018-10-13 DIAGNOSIS — R1110 Vomiting, unspecified: Secondary | ICD-10-CM | POA: Diagnosis not present

## 2018-10-13 DIAGNOSIS — E782 Mixed hyperlipidemia: Secondary | ICD-10-CM | POA: Diagnosis not present

## 2018-10-13 DIAGNOSIS — N189 Chronic kidney disease, unspecified: Secondary | ICD-10-CM | POA: Diagnosis not present

## 2018-10-13 DIAGNOSIS — I129 Hypertensive chronic kidney disease with stage 1 through stage 4 chronic kidney disease, or unspecified chronic kidney disease: Secondary | ICD-10-CM | POA: Diagnosis not present

## 2018-10-13 DIAGNOSIS — R112 Nausea with vomiting, unspecified: Secondary | ICD-10-CM | POA: Diagnosis not present

## 2018-10-13 DIAGNOSIS — K3184 Gastroparesis: Secondary | ICD-10-CM | POA: Diagnosis not present

## 2018-10-13 DIAGNOSIS — R109 Unspecified abdominal pain: Secondary | ICD-10-CM | POA: Diagnosis not present

## 2018-10-13 DIAGNOSIS — R509 Fever, unspecified: Secondary | ICD-10-CM | POA: Diagnosis not present

## 2018-10-13 DIAGNOSIS — E1122 Type 2 diabetes mellitus with diabetic chronic kidney disease: Secondary | ICD-10-CM | POA: Diagnosis not present

## 2018-10-13 DIAGNOSIS — N183 Chronic kidney disease, stage 3 (moderate): Secondary | ICD-10-CM | POA: Diagnosis not present

## 2018-10-18 ENCOUNTER — Ambulatory Visit: Payer: Medicare HMO | Admitting: Podiatry

## 2018-10-21 DIAGNOSIS — R410 Disorientation, unspecified: Secondary | ICD-10-CM | POA: Diagnosis not present

## 2018-10-21 DIAGNOSIS — I129 Hypertensive chronic kidney disease with stage 1 through stage 4 chronic kidney disease, or unspecified chronic kidney disease: Secondary | ICD-10-CM | POA: Diagnosis not present

## 2018-10-21 DIAGNOSIS — E1165 Type 2 diabetes mellitus with hyperglycemia: Secondary | ICD-10-CM | POA: Diagnosis not present

## 2018-10-21 DIAGNOSIS — F23 Brief psychotic disorder: Secondary | ICD-10-CM | POA: Diagnosis not present

## 2018-10-21 DIAGNOSIS — Z888 Allergy status to other drugs, medicaments and biological substances status: Secondary | ICD-10-CM | POA: Diagnosis not present

## 2018-10-21 DIAGNOSIS — N182 Chronic kidney disease, stage 2 (mild): Secondary | ICD-10-CM | POA: Diagnosis not present

## 2018-10-21 DIAGNOSIS — R451 Restlessness and agitation: Secondary | ICD-10-CM | POA: Diagnosis not present

## 2018-10-21 DIAGNOSIS — F5089 Other specified eating disorder: Secondary | ICD-10-CM | POA: Diagnosis not present

## 2018-10-21 DIAGNOSIS — F29 Unspecified psychosis not due to a substance or known physiological condition: Secondary | ICD-10-CM | POA: Diagnosis not present

## 2018-10-21 DIAGNOSIS — E1122 Type 2 diabetes mellitus with diabetic chronic kidney disease: Secondary | ICD-10-CM | POA: Diagnosis not present

## 2018-10-24 ENCOUNTER — Other Ambulatory Visit: Payer: Self-pay

## 2018-10-24 ENCOUNTER — Encounter (HOSPITAL_COMMUNITY): Payer: Self-pay

## 2018-10-24 ENCOUNTER — Inpatient Hospital Stay (HOSPITAL_COMMUNITY)
Admission: EM | Admit: 2018-10-24 | Discharge: 2018-10-27 | DRG: 074 | Disposition: A | Discharge status: Home Health | Payer: Medicare HMO | Attending: Internal Medicine | Admitting: Internal Medicine

## 2018-10-24 ENCOUNTER — Observation Stay (HOSPITAL_COMMUNITY): Payer: Medicare HMO

## 2018-10-24 ENCOUNTER — Emergency Department (HOSPITAL_COMMUNITY): Payer: Medicare HMO

## 2018-10-24 DIAGNOSIS — Z886 Allergy status to analgesic agent status: Secondary | ICD-10-CM

## 2018-10-24 DIAGNOSIS — T82898A Other specified complication of vascular prosthetic devices, implants and grafts, initial encounter: Secondary | ICD-10-CM

## 2018-10-24 DIAGNOSIS — R451 Restlessness and agitation: Secondary | ICD-10-CM | POA: Diagnosis not present

## 2018-10-24 DIAGNOSIS — E1143 Type 2 diabetes mellitus with diabetic autonomic (poly)neuropathy: Secondary | ICD-10-CM | POA: Diagnosis present

## 2018-10-24 DIAGNOSIS — R112 Nausea with vomiting, unspecified: Secondary | ICD-10-CM | POA: Diagnosis not present

## 2018-10-24 DIAGNOSIS — E785 Hyperlipidemia, unspecified: Secondary | ICD-10-CM | POA: Diagnosis present

## 2018-10-24 DIAGNOSIS — R1110 Vomiting, unspecified: Secondary | ICD-10-CM | POA: Diagnosis not present

## 2018-10-24 DIAGNOSIS — Z8249 Family history of ischemic heart disease and other diseases of the circulatory system: Secondary | ICD-10-CM | POA: Diagnosis not present

## 2018-10-24 DIAGNOSIS — E876 Hypokalemia: Secondary | ICD-10-CM | POA: Diagnosis present

## 2018-10-24 DIAGNOSIS — N179 Acute kidney failure, unspecified: Secondary | ICD-10-CM | POA: Diagnosis present

## 2018-10-24 DIAGNOSIS — N184 Chronic kidney disease, stage 4 (severe): Secondary | ICD-10-CM | POA: Diagnosis present

## 2018-10-24 DIAGNOSIS — Z6841 Body Mass Index (BMI) 40.0 and over, adult: Secondary | ICD-10-CM

## 2018-10-24 DIAGNOSIS — T80818A Extravasation of other vesicant agent, initial encounter: Secondary | ICD-10-CM | POA: Diagnosis not present

## 2018-10-24 DIAGNOSIS — E1122 Type 2 diabetes mellitus with diabetic chronic kidney disease: Secondary | ICD-10-CM | POA: Diagnosis present

## 2018-10-24 DIAGNOSIS — N189 Chronic kidney disease, unspecified: Secondary | ICD-10-CM | POA: Diagnosis not present

## 2018-10-24 DIAGNOSIS — Z880 Allergy status to penicillin: Secondary | ICD-10-CM

## 2018-10-24 DIAGNOSIS — E113599 Type 2 diabetes mellitus with proliferative diabetic retinopathy without macular edema, unspecified eye: Secondary | ICD-10-CM

## 2018-10-24 DIAGNOSIS — Z888 Allergy status to other drugs, medicaments and biological substances status: Secondary | ICD-10-CM

## 2018-10-24 DIAGNOSIS — I129 Hypertensive chronic kidney disease with stage 1 through stage 4 chronic kidney disease, or unspecified chronic kidney disease: Secondary | ICD-10-CM | POA: Diagnosis present

## 2018-10-24 DIAGNOSIS — F419 Anxiety disorder, unspecified: Secondary | ICD-10-CM | POA: Diagnosis present

## 2018-10-24 DIAGNOSIS — D649 Anemia, unspecified: Secondary | ICD-10-CM

## 2018-10-24 DIAGNOSIS — Z833 Family history of diabetes mellitus: Secondary | ICD-10-CM

## 2018-10-24 DIAGNOSIS — I160 Hypertensive urgency: Secondary | ICD-10-CM

## 2018-10-24 DIAGNOSIS — Z794 Long term (current) use of insulin: Secondary | ICD-10-CM

## 2018-10-24 DIAGNOSIS — E869 Volume depletion, unspecified: Secondary | ICD-10-CM | POA: Diagnosis present

## 2018-10-24 DIAGNOSIS — R221 Localized swelling, mass and lump, neck: Secondary | ICD-10-CM | POA: Diagnosis not present

## 2018-10-24 DIAGNOSIS — Z881 Allergy status to other antibiotic agents status: Secondary | ICD-10-CM

## 2018-10-24 DIAGNOSIS — Z79899 Other long term (current) drug therapy: Secondary | ICD-10-CM | POA: Diagnosis not present

## 2018-10-24 DIAGNOSIS — I517 Cardiomegaly: Secondary | ICD-10-CM | POA: Diagnosis not present

## 2018-10-24 DIAGNOSIS — K219 Gastro-esophageal reflux disease without esophagitis: Secondary | ICD-10-CM | POA: Diagnosis present

## 2018-10-24 DIAGNOSIS — I878 Other specified disorders of veins: Secondary | ICD-10-CM

## 2018-10-24 DIAGNOSIS — E1151 Type 2 diabetes mellitus with diabetic peripheral angiopathy without gangrene: Secondary | ICD-10-CM | POA: Diagnosis present

## 2018-10-24 DIAGNOSIS — K3184 Gastroparesis: Secondary | ICD-10-CM | POA: Diagnosis present

## 2018-10-24 DIAGNOSIS — H409 Unspecified glaucoma: Secondary | ICD-10-CM | POA: Diagnosis present

## 2018-10-24 DIAGNOSIS — Z91013 Allergy to seafood: Secondary | ICD-10-CM

## 2018-10-24 DIAGNOSIS — Z883 Allergy status to other anti-infective agents status: Secondary | ICD-10-CM

## 2018-10-24 LAB — URINALYSIS, ROUTINE W REFLEX MICROSCOPIC
Bilirubin Urine: NEGATIVE
Glucose, UA: >=500 mg/dL — AB
Ketones, ur: NEGATIVE mg/dL
Leukocytes,Ua: NEGATIVE
Nitrite: NEGATIVE
Protein, ur: >=300 mg/dL — AB
Specific Gravity, Urine: 1.014 (ref 1.005–1.030)
pH: 5 (ref 5.0–8.0)

## 2018-10-24 LAB — CBC WITH DIFFERENTIAL/PLATELET
Abs Immature Granulocytes: 0.03 10*3/uL (ref 0.00–0.07)
Basophils Absolute: 0 10*3/uL (ref 0.0–0.1)
Basophils Relative: 0 %
Eosinophils Absolute: 0.1 10*3/uL (ref 0.0–0.5)
Eosinophils Relative: 1 %
HCT: 24.9 % — ABNORMAL LOW (ref 39.0–52.0)
Hemoglobin: 8.1 g/dL — ABNORMAL LOW (ref 13.0–17.0)
Immature Granulocytes: 0 %
Lymphocytes Relative: 8 %
Lymphs Abs: 0.7 10*3/uL (ref 0.7–4.0)
MCH: 30.2 pg (ref 26.0–34.0)
MCHC: 32.5 g/dL (ref 30.0–36.0)
MCV: 92.9 fL (ref 80.0–100.0)
Monocytes Absolute: 0.6 10*3/uL (ref 0.1–1.0)
Monocytes Relative: 7 %
Neutro Abs: 6.6 10*3/uL (ref 1.7–7.7)
Neutrophils Relative %: 84 %
Platelets: 153 10*3/uL (ref 150–400)
RBC: 2.68 MIL/uL — ABNORMAL LOW (ref 4.22–5.81)
RDW: 12 % (ref 11.5–15.5)
WBC: 7.9 10*3/uL (ref 4.0–10.5)
nRBC: 0 % (ref 0.0–0.2)

## 2018-10-24 LAB — CBG MONITORING, ED: Glucose-Capillary: 204 mg/dL — ABNORMAL HIGH (ref 70–99)

## 2018-10-24 LAB — COMPREHENSIVE METABOLIC PANEL
ALT: 11 U/L (ref 0–44)
AST: 21 U/L (ref 15–41)
Albumin: 3.2 g/dL — ABNORMAL LOW (ref 3.5–5.0)
Alkaline Phosphatase: 49 U/L (ref 38–126)
Anion gap: 8 (ref 5–15)
BUN: 26 mg/dL — ABNORMAL HIGH (ref 6–20)
CO2: 21 mmol/L — ABNORMAL LOW (ref 22–32)
Calcium: 8.3 mg/dL — ABNORMAL LOW (ref 8.9–10.3)
Chloride: 109 mmol/L (ref 98–111)
Creatinine, Ser: 4.21 mg/dL — ABNORMAL HIGH (ref 0.61–1.24)
GFR calc Af Amer: 18 mL/min — ABNORMAL LOW (ref >60)
GFR calc non Af Amer: 16 mL/min — ABNORMAL LOW (ref >60)
Glucose, Bld: 233 mg/dL — ABNORMAL HIGH (ref 70–99)
Potassium: 3.4 mmol/L — ABNORMAL LOW (ref 3.5–5.1)
Sodium: 138 mmol/L (ref 135–145)
Total Bilirubin: 0.7 mg/dL (ref 0.3–1.2)
Total Protein: 6.5 g/dL (ref 6.5–8.1)

## 2018-10-24 LAB — LIPASE, BLOOD: Lipase: 33 U/L (ref 11–51)

## 2018-10-24 MED ORDER — ONDANSETRON HCL 4 MG/2ML IJ SOLN: 4.0000 mg | Freq: Four times a day (QID) | INTRAMUSCULAR | Status: DC | PRN

## 2018-10-24 MED ORDER — LINAGLIPTIN 5 MG PO TABS
5.0000 mg | ORAL_TABLET | Freq: Every day | ORAL | Status: DC
  Administered 2018-10-25 – 2018-10-27 (×3): 5 mg via ORAL
  Filled 2018-10-24 (×4): qty 1

## 2018-10-24 MED ORDER — ENOXAPARIN SODIUM 30 MG/0.3ML ~~LOC~~ SOLN
30.0000 mg | SUBCUTANEOUS | Status: DC
  Administered 2018-10-25 (×2): 30 mg via SUBCUTANEOUS
  Filled 2018-10-24 (×2): qty 0.3

## 2018-10-24 MED ORDER — LORAZEPAM 2 MG/ML IJ SOLN
1.0000 mg | Freq: Once | INTRAMUSCULAR | Status: DC
  Administered 2018-10-24: 2 mg via INTRAVENOUS

## 2018-10-24 MED ORDER — ACETAMINOPHEN-CODEINE 120-12 MG/5ML PO SUSP: 5.0000 mL | Freq: Four times a day (QID) | ORAL | 0 refills | Status: DC | PRN

## 2018-10-24 MED ORDER — FENTANYL CITRATE (PF) 100 MCG/2ML IJ SOLN
50.0000 ug | Freq: Once | INTRAMUSCULAR | Status: AC
  Administered 2018-10-24: 50 ug via INTRAVENOUS
  Filled 2018-10-24: qty 2

## 2018-10-24 MED ORDER — LORAZEPAM 2 MG/ML IJ SOLN
0.5000 mg | Freq: Once | INTRAMUSCULAR | Status: AC
  Administered 2018-10-24: 0.5 mg via INTRAMUSCULAR

## 2018-10-24 MED ORDER — BRIMONIDINE TARTRATE 0.2 % OP SOLN
1.0000 [drp] | Freq: Three times a day (TID) | OPHTHALMIC | Status: DC
  Administered 2018-10-25 – 2018-10-27 (×8): 1 [drp] via OPHTHALMIC
  Filled 2018-10-24 (×3): qty 5

## 2018-10-24 MED ORDER — SODIUM CHLORIDE 0.9 % IV BOLUS
1000.0000 mL | Freq: Once | INTRAVENOUS | Status: AC
  Administered 2018-10-24: 1000 mL via INTRAVENOUS

## 2018-10-24 MED ORDER — INSULIN ASPART PROT & ASPART (70-30 MIX) 100 UNIT/ML ~~LOC~~ SUSP
25.0000 [IU] | Freq: Two times a day (BID) | SUBCUTANEOUS | Status: DC
  Filled 2018-10-24: qty 10

## 2018-10-24 MED ORDER — POLYETHYLENE GLYCOL 3350 17 G PO PACK: 17.0000 g | PACK | Freq: Every day | ORAL | Status: DC | PRN

## 2018-10-24 MED ORDER — HYDROMORPHONE HCL 2 MG/ML IJ SOLN
2.0000 mg | Freq: Once | INTRAMUSCULAR | Status: AC
  Administered 2018-10-24: 2 mg via INTRAMUSCULAR

## 2018-10-24 MED ORDER — SUCRALFATE 1 GM/10ML PO SUSP: 1.0000 g | Freq: Two times a day (BID) | ORAL | 0 refills | Status: DC | PRN

## 2018-10-24 MED ORDER — PANTOPRAZOLE SODIUM 40 MG PO TBEC
40.0000 mg | DELAYED_RELEASE_TABLET | Freq: Two times a day (BID) | ORAL | Status: DC
  Administered 2018-10-25 – 2018-10-27 (×5): 40 mg via ORAL
  Filled 2018-10-24 (×5): qty 1

## 2018-10-24 MED ORDER — HYDRALAZINE HCL 25 MG PO TABS
100.0000 mg | ORAL_TABLET | Freq: Three times a day (TID) | ORAL | Status: DC
  Administered 2018-10-25 – 2018-10-27 (×8): 100 mg via ORAL
  Filled 2018-10-24 (×8): qty 4

## 2018-10-24 MED ORDER — POTASSIUM CHLORIDE CRYS ER 20 MEQ PO TBCR
40.0000 meq | EXTENDED_RELEASE_TABLET | Freq: Once | ORAL | Status: AC
  Administered 2018-10-25: 40 meq via ORAL
  Filled 2018-10-24: qty 2

## 2018-10-24 MED ORDER — CARVEDILOL 12.5 MG PO TABS
25.0000 mg | ORAL_TABLET | Freq: Two times a day (BID) | ORAL | Status: DC
  Administered 2018-10-25 – 2018-10-27 (×5): 25 mg via ORAL
  Filled 2018-10-24 (×5): qty 2

## 2018-10-24 MED ORDER — LORAZEPAM 2 MG/ML IJ SOLN: 0.5000 mg | Freq: Once | INTRAMUSCULAR | Status: DC

## 2018-10-24 MED ORDER — ATORVASTATIN CALCIUM 10 MG PO TABS
10.0000 mg | ORAL_TABLET | Freq: Every day | ORAL | Status: DC
  Administered 2018-10-25 – 2018-10-26 (×3): 10 mg via ORAL
  Filled 2018-10-24 (×3): qty 1

## 2018-10-24 MED ORDER — ONDANSETRON HCL 4 MG PO TABS: 4.0000 mg | ORAL_TABLET | Freq: Four times a day (QID) | ORAL | Status: DC | PRN

## 2018-10-24 MED ORDER — LORAZEPAM 2 MG/ML IJ SOLN
INTRAMUSCULAR | Status: AC
  Filled 2018-10-24: qty 1

## 2018-10-24 MED ORDER — ACETAMINOPHEN 325 MG PO TABS: 650.0000 mg | ORAL_TABLET | Freq: Four times a day (QID) | ORAL | Status: DC | PRN

## 2018-10-24 MED ORDER — LORAZEPAM 2 MG/ML IJ SOLN
INTRAMUSCULAR | Status: AC
  Administered 2018-10-24: 2 mg via INTRAVENOUS
  Filled 2018-10-24: qty 1

## 2018-10-24 MED ORDER — ONDANSETRON HCL 4 MG/2ML IJ SOLN
4.0000 mg | Freq: Once | INTRAMUSCULAR | Status: AC
  Administered 2018-10-24: 4 mg via INTRAVENOUS
  Filled 2018-10-24: qty 2

## 2018-10-24 MED ORDER — POTASSIUM CHLORIDE CRYS ER 20 MEQ PO TBCR
20.0000 meq | EXTENDED_RELEASE_TABLET | Freq: Every day | ORAL | Status: DC
  Administered 2018-10-25: 20 meq via ORAL
  Filled 2018-10-24: qty 1

## 2018-10-24 MED ORDER — HYDROMORPHONE HCL 2 MG/ML IJ SOLN
INTRAMUSCULAR | Status: AC
  Filled 2018-10-24: qty 1

## 2018-10-24 MED ORDER — LORAZEPAM 1 MG PO TABS
1.0000 mg | ORAL_TABLET | Freq: Two times a day (BID) | ORAL | Status: DC | PRN
  Administered 2018-10-25 – 2018-10-26 (×2): 1 mg via ORAL
  Filled 2018-10-24 (×2): qty 1

## 2018-10-24 MED ORDER — OXYCODONE-ACETAMINOPHEN 5-325 MG PO TABS
1.0000 | ORAL_TABLET | Freq: Once | ORAL | Status: AC
  Administered 2018-10-24: 1 via ORAL
  Filled 2018-10-24: qty 1

## 2018-10-24 MED ORDER — POTASSIUM CHLORIDE IN NACL 20-0.9 MEQ/L-% IV SOLN: INTRAVENOUS | Status: DC

## 2018-10-24 MED ORDER — LORAZEPAM 2 MG/ML IJ SOLN: 2.0000 mg | Freq: Once | INTRAMUSCULAR | Status: DC

## 2018-10-24 MED ORDER — CLONIDINE HCL 0.2 MG PO TABS
0.3000 mg | ORAL_TABLET | Freq: Two times a day (BID) | ORAL | Status: DC
  Administered 2018-10-25 – 2018-10-27 (×6): 0.3 mg via ORAL
  Filled 2018-10-24 (×6): qty 1

## 2018-10-24 MED ORDER — ENOXAPARIN SODIUM 40 MG/0.4ML ~~LOC~~ SOLN: 40.0000 mg | SUBCUTANEOUS | Status: DC

## 2018-10-24 MED ORDER — HYDROMORPHONE HCL 1 MG/ML IJ SOLN
1.0000 mg | Freq: Once | INTRAMUSCULAR | Status: AC
  Administered 2018-10-24: 1 mg via INTRAMUSCULAR
  Filled 2018-10-24: qty 1

## 2018-10-24 MED ORDER — LOSARTAN POTASSIUM 25 MG PO TABS: 25.0000 mg | ORAL_TABLET | Freq: Every day | ORAL | Status: DC

## 2018-10-24 MED ORDER — ACETAMINOPHEN 650 MG RE SUPP: 650.0000 mg | Freq: Four times a day (QID) | RECTAL | Status: DC | PRN

## 2018-10-24 NOTE — ED Notes (Signed)
Sitter notes that pt has reached over to increase his O2  When she asked what he was doing, pt decreased his O2  Pt reports he feels as though he is choking  Dr C informed and will evaluate

## 2018-10-24 NOTE — ED Provider Notes (Signed)
[facility] EMERGENCY DEPARTMENT Provider Note   CSN: [id number] Arrival date & time: 10/24/18  1007    History   Chief Complaint Chief Complaint  Patient presents with  . Emesis    HPI Timothy Clark is a 48 y.o. male.     Patient presents with nausea and vomiting for the past day.  He has gastroparesis secondary to diabetes.  No diarrhea, fever, sweats, chills, chest pain, dyspnea.  This medical history includes diabetes, gastroparesis, chronic kidney disease.  Severity of symptoms moderate.  Nothing makes symptoms better or worse.     Past Medical History:  Diagnosis Date  . Chronic abdominal pain   . Esophagitis   . Eye hemorrhage   . Gastritis   . Gastroparesis   . GERD (gastroesophageal reflux disease)   . Glaucoma   . Helicobacter pylori (H. pylori) infection 2016   ERADICATION DOCUMENTED VIA STOOL TEST in AUG 2016 at [place]  . Hiccups   . Hypertension   . Nausea and vomiting    chronic, recurrent  . Type 2 diabetes mellitus with complications (HCC)    diagnosed around age 55    Patient Active Problem List   Diagnosis Date Noted  . HTN (hypertension), malignant 06/13/2018  . Scrotal swelling 11/10/2017  . Swelling 11/09/2017  . Scrotal edema   . Obesity, Class III, BMI 40-49.9 (morbid obesity) ([place]) 11/02/2017  . CKD stage 3 due to type 2 diabetes mellitus ([place]) 10/30/2017  . Wound eschars of foot, right  10/30/2017  . Chronic abdominal pain 09/21/2017  . Prolonged QT interval 09/21/2017  . GERD (gastroesophageal reflux disease) 07/14/2017  . Glaucoma 07/14/2017  . Anemia 07/14/2017  . Pressure injury of skin 02/01/2017  . Insulin dependent diabetes mellitus ([place]) 09/07/2016  . Orthostatic syncope 09/05/2016  . 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 Gastroparesis due to DM ([place]) 02/06/2015  . Essential hypertension 12/06/2014  . Diabetic macular edema ([place]) 02/18/2014  . PDR (proliferative diabetic retinopathy) ([place]) 02/06/2014  . Neovascular  glaucoma of left eye 01/11/2014  . Diabetic infection of left heel ([place]) 02/14/2010  . UNSPECIFIED PERIPHERAL VASCULAR DISEASE 10/19/2009  . ERECTILE DYSFUNCTION, ORGANIC 10/19/2009  . NUMBNESS 07/20/2009  . Diabetes mellitus type 2 with complications, uncontrolled ([place]) 03/22/2009  . Hyperlipidemia 03/22/2009  . Depression 03/22/2009    Past Surgical History:  Procedure Laterality Date  . CATARACT EXTRACTION W/PHACO Left 05/19/2016   Procedure: CATARACT EXTRACTION PHACO AND INTRAOCULAR LENS PLACEMENT LEFT EYE;  Surgeon: Tonny Branch, MD;  Location: [facility];  Service: Ophthalmology;  Laterality: Left;  CDE: 7.30  . CATARACT EXTRACTION W/PHACO Right 06/23/2016   Procedure: CATARACT EXTRACTION PHACO AND INTRAOCULAR LENS PLACEMENT RIGHT EYE CDE=9.87;  Surgeon: Tonny Branch, MD;  Location: [facility];  Service: Ophthalmology;  Laterality: Right;  right  . ESOPHAGOGASTRODUODENOSCOPY  2015   Dr. Britta Mccreedy  . ESOPHAGOGASTRODUODENOSCOPY N/A 10/26/2014   RMR: Distal esophagititis-likely reflux related although an element of pill induced injuruy no exclueded  status post biopsy. Diffusely abnormal gastric mucosa of uncertain signigicane -status post gastric biopsy. Focal area of excoriation in the cardia most consistant with a trauma of heaving.   . ESOPHAGOGASTRODUODENOSCOPY  08/2015   [facility]: mild esophagitis and old gastric contents  . EYE SURGERY  2015   stent placed to left eye  . EYE SURGERY    . INCISION AND DRAINAGE OF WOUND Right 07/16/2017   Procedure: IRRIGATION AND DEBRIDEMENT OF SOFT TISSUE OF ULCERATION RIGHT FOOT;  Surgeon: Caprice Beaver, DPM;  Location: [facility];  Service: Podiatry;  Laterality: Right;        Home Medications    Prior to Admission medications   Medication Sig Start Date End Date Taking? Authorizing Provider  atorvastatin (LIPITOR) 10 MG tablet Take 10 mg by mouth at bedtime.    Yes [provider]  Blood Glucose Monitoring Suppl (PRODIGY AUTOCODE BLOOD  GLUCOSE) w/Device KIT  04/16/18  Yes [provider]  brimonidine (ALPHAGAN) 0.2 % ophthalmic solution Place 1 drop into both eyes 3 (three) times daily.    Yes [provider]  carvedilol (COREG) 25 MG tablet Take 25 mg by mouth 2 (two) times daily with a meal.   Yes [provider]  cloNIDine (CATAPRES) 0.2 MG tablet Take 0.4 mg by mouth 2 (two) times daily.    Yes [provider]  dorzolamide (TRUSOPT) 2 % ophthalmic solution INSTILL 1 DROPS INTO EACH EYE TWICE DAILY 04/23/18  Yes [provider]  folic acid (FOLVITE) 1 MG tablet Take 1 mg by mouth daily.   Yes [provider]  hydrALAZINE (APRESOLINE) 100 MG tablet Take 100 mg by mouth 3 (three) times daily. 10/21/18  Yes [provider]  insulin aspart protamine- aspart (NOVOLOG MIX 70/30) (70-30) 100 UNIT/ML injection Inject 0.25 mLs (25 Units total) into the skin 2 (two) times daily with a meal. 09/24/17  Yes Georgette Shell, MD  lactulose (CEPHULAC) 10 g packet Take 1 packet (10 g total) by mouth every morning. 06/09/18  Yes Nena Polio, MD  LORazepam (ATIVAN) 1 MG tablet Take 1 mg by mouth 2 (two) times daily as needed. for anxiety 05/07/18  Yes [provider]  losartan (COZAAR) 25 MG tablet Take 25 mg by mouth daily. 10/21/18  Yes [provider]  metoCLOPramide (REGLAN) 5 MG tablet Take 1 tablet (5 mg total) by mouth 2 (two) times daily at 8 am and 10 pm. 09/03/18  Yes Annitta Needs, NP  pantoprazole (PROTONIX) 40 MG tablet Take 1 tablet (40 mg total) by mouth 2 (two) times daily before a meal. 08/24/18  Yes Annitta Needs, NP  potassium chloride SA (K-DUR,KLOR-CON) 20 MEQ tablet Take 1 tablet (20 mEq total) by mouth 2 (two) times daily. Patient taking differently: Take 20 mEq by mouth daily.  11/12/17  Yes Gherghe, Vella Redhead, MD  promethazine (PHENERGAN) 25 MG tablet Take 0.5 tablets (12.5 mg total) by mouth every 8 (eight) hours as needed for nausea or vomiting.  To one tablet every 6 hours 08/26/18  Yes Annitta Needs, NP  sitaGLIPtin (JANUVIA) 50 MG tablet Take 50 mg by mouth daily.   Yes [provider]  sucralfate (CARAFATE) 1 g tablet Take 1 g by mouth 4 (four) times daily.    Yes [provider]  timolol (TIMOPTIC) 0.5 % ophthalmic solution Place 1 drop into both eyes 2 (two) times daily.   Yes [provider]  torsemide (DEMADEX) 20 MG tablet Take 1 tablet (20 mg total) by mouth daily. Patient taking differently: Take 40 mg by mouth daily.  06/16/18 10/24/18 Yes Sainani, Belia Heman, MD  aMILoride (MIDAMOR) 5 MG tablet Take 1 tablet (5 mg total) by mouth daily. Patient not taking: Reported on 09/03/2018 06/17/18 07/17/18  Henreitta Leber, MD  cyanocobalamin (,VITAMIN B-12,) 1000 MCG/ML injection INJECT 1 ML (CC) INTRAMUSCULARLY ONCE A WEEK FOR 4 WEEKS THEN MONTHLY 04/19/18   [provider]  hydrALAZINE (APRESOLINE) 100 MG tablet Take 1 tablet (100  mg total) by mouth 3 (three) times daily. 06/16/18 09/03/18  Henreitta Leber, MD    Family History Family History  Problem Relation Age of Onset  . Ovarian cancer Mother   . Heart attack Father   . Cervical cancer Sister   . Diabetes Sister   . Colon cancer Neg Hx     Social History Social History   Tobacco Use  . Smoking status: Never Smoker  . Smokeless tobacco: Never Used  Substance Use Topics  . Alcohol use: No    Alcohol/week: 0.0 standard drinks  . Drug use: No     Allergies   Donnatal [pb-hyoscy-atropine-scopolamine]; Fish allergy; Haldol [haloperidol lactate]; Lidocaine; Maalox [calcium carbonate antacid]; Marinol [dronabinol]; Aspirin; Bactrim [sulfamethoxazole-trimethoprim]; Doxycycline; Keflex [cephalexin]; and Penicillins   Review of Systems Review of Systems  All other systems reviewed and are negative.    Physical Exam Updated Vital Signs BP (!) 197/100   Pulse 87   Temp 98.3 F (36.8 C)   Resp 18   Ht 6' (1.829 m)   Wt 136.1 kg    SpO2 100%   BMI 40.69 kg/m   Physical Exam Vitals signs and nursing note reviewed.  Constitutional:      Appearance: He is well-developed.     Comments: Vomiting, elevated BMI  HENT:     Head: Normocephalic and atraumatic.  Eyes:     Conjunctiva/sclera: Conjunctivae normal.  Neck:     Musculoskeletal: Neck supple.  Cardiovascular:     Rate and Rhythm: Normal rate and regular rhythm.  Pulmonary:     Effort: Pulmonary effort is normal.     Breath sounds: Normal breath sounds.  Abdominal:     General: Bowel sounds are normal.     Palpations: Abdomen is soft.     Comments: Minimal generalized tenderness.  Musculoskeletal: Normal range of motion.  Skin:    General: Skin is warm and dry.  Neurological:     Mental Status: He is alert and oriented to person, place, and time.  Psychiatric:        Behavior: Behavior normal.      ED Treatments / Results  Labs (all labs ordered are listed, but only abnormal results are displayed) Labs Reviewed  CBC WITH DIFFERENTIAL/PLATELET - Abnormal; Notable for the following components:      Result Value   RBC 2.68 (*)    Hemoglobin 8.1 (*)    HCT 24.9 (*)    All other components within normal limits  COMPREHENSIVE METABOLIC PANEL - Abnormal; Notable for the following components:   Potassium 3.4 (*)    CO2 21 (*)    Glucose, Bld 233 (*)    BUN 26 (*)    Creatinine, Ser 4.21 (*)    Calcium 8.3 (*)    Albumin 3.2 (*)    GFR calc non Af Amer 16 (*)    GFR calc Af Amer 18 (*)    All other components within normal limits  URINALYSIS, ROUTINE W REFLEX MICROSCOPIC - Abnormal; Notable for the following components:   Glucose, UA >=500 (*)    Hgb urine dipstick MODERATE (*)    Protein, ur >=300 (*)    Bacteria, UA RARE (*)    All other components within normal limits  CBG MONITORING, ED - Abnormal; Notable for the following components:   Glucose-Capillary 204 (*)    All other components within normal limits  LIPASE, BLOOD    EKG  None  Radiology No results found.  Procedures  Procedures (including critical care time)  Medications Ordered in ED Medications  ondansetron (ZOFRAN) injection 4 mg (4 mg Intravenous Given 10/24/18 1135)  sodium chloride 0.9 % bolus 1,000 mL (1,000 mLs Intravenous New Bag/Given 10/24/18 1137)  LORazepam (ATIVAN) 2 MG/ML injection (2 mg  Given by Other 10/24/18 1030)  HYDROmorphone (DILAUDID) injection 2 mg (2 mg Intramuscular Given 10/24/18 1045)  sodium chloride 0.9 % bolus 1,000 mL (0 mLs Intravenous Stopped 10/24/18 1351)  fentaNYL (SUBLIMAZE) injection 50 mcg (50 mcg Intravenous Given 10/24/18 1345)     Initial Impression / Assessment and Plan / ED Course  I have reviewed the triage vital signs and the nursing notes.  Pertinent labs & imaging results that were available during my care of the patient were reviewed by me and considered in my medical decision making (see chart for details).        Patient presents with nausea vomiting likely secondary to his diabetic gastroparesis.  EJ IV placed by RN under my supervision for hydration and pain management.  Patient responded well to iv pain medication and antiemetics.  1505: Right EJ IV has infiltrated.  IV removed.  Ice pack applied.  Patient will be observed.  No airway compromise.  Discussed with Dr. Alvino Chapel.  Final Clinical Impressions(s) / ED Diagnoses   Final diagnoses:  Intractable vomiting with nausea, unspecified vomiting type  Anemia, unspecified type  AKI (acute kidney injury) [facility])    ED Discharge Orders    None       [name], MD 10/24/18 [phone]

## 2018-10-24 NOTE — ED Notes (Signed)
Pt complains pain med has not yet worked   Runner, broadcasting/film/video that IV faster than po and takes a bit longer   Will recheck

## 2018-10-24 NOTE — ED Notes (Signed)
IV to EJ noted to infiltrated   Dr Donnie Mesa, RN, CN apprised and ice pack application

## 2018-10-24 NOTE — ED Provider Notes (Signed)
  Physical Exam  BP (!) 200/100   Pulse 91   Temp 98.3 F (36.8 C)   Resp 18   Ht 6' (1.829 m)   Wt 136.1 kg   SpO2 100%   BMI 40.69 kg/m   Physical Exam  ED Course/Procedures     Procedures  MDM  Patient in signout history of gastroparesis.  Continued abdominal pain and cramping.  Cannot tolerate orals although not currently vomiting, however an EJ line had been placed and reportedly had had some infiltration.  Now complaining some trouble breathing with a little bit of gurgling.  Still able to swallow however.  CT scan done and shows fluid collection retropharyngeal.  Discussed with Dr. Redmond Baseman from ENT.  Feels as if this is just saline and not an extremis right now patient can be monitored overnight and likely not need transfer down to [facility] at all.  Should be able to just be intubated if more breathing issues develop.  Will discuss with hospitalist for admission.       Davonna Belling, MD 10/24/18 [phone]

## 2018-10-24 NOTE — H&P (Addendum)
History and Physical    LAKEITH CAREAGA QBV:[id number] DOB: [birth year] DOA: 10/24/2018  PCP: [name], MD   Patient coming from: Home  I have personally briefly reviewed patient's old medical records in [place]  Chief Complaint: Abdominal pain  HPI: Timothy Clark is a 48 y.o. male with medical history significant for DM with gastroparesis, HTN, CKD3, depression, peripheral vascular disease, obesity who was brought to the ED with complaints of multiple episodes of vomiting, with nausea unable to keep food down over 1 to 2 days duration, with abdominal pain mostly upper abdomen with bloating.  Symptoms are consistent with patient gastroparesis symptoms.  Reports last bowel movement was last night.  ED Course: Elevated blood pressure systolic up to 280K, otherwise stable vitals.  Creatinine 4.21, recent check 3 months ago 3.7.  Mild hypokalemia 3.4.  Patient's had IV access placed in external jugular which subsequently infiltrated.  With swelling of his neck and feeling like he was choking, CT neck without contrast was obtained which showed-retropharyngeal low-density fluid collection estimated at 34 mils with associated mass-effect on the oral and hypopharynx, mild associated airway narrowing.  ED provider talked to ENT on call who recommended admission here no intervention recommended.  If patient gets significantly short of breath may consider intubation. Patient very anxious in the ED, but at bedside this is not new, and neck swelling appears to have improved in the ED.  Requiring IV Dilaudid to help calm him down, and needs sitter at bedside.  Review of Systems: As per HPI all other systems reviewed and negative.  Past Medical History:  Diagnosis Date   Chronic abdominal pain    Esophagitis    Eye hemorrhage    Gastritis    Gastroparesis    GERD (gastroesophageal reflux disease)    Glaucoma    Helicobacter pylori (H. pylori) infection 2016   ERADICATION  DOCUMENTED VIA STOOL TEST in AUG 2016 at [facility]   Hiccups    Hypertension    Nausea and vomiting    chronic, recurrent   Type 2 diabetes mellitus with complications ([place])    diagnosed around age 24    Past Surgical History:  Procedure Laterality Date   CATARACT EXTRACTION W/PHACO Left 05/19/2016   Procedure: CATARACT EXTRACTION PHACO AND INTRAOCULAR LENS PLACEMENT LEFT EYE;  Surgeon: Tonny Branch, MD;  Location: [facility];  Service: Ophthalmology;  Laterality: Left;  CDE: 7.30   CATARACT EXTRACTION W/PHACO Right 06/23/2016   Procedure: CATARACT EXTRACTION PHACO AND INTRAOCULAR LENS PLACEMENT RIGHT EYE CDE=9.87;  Surgeon: Tonny Branch, MD;  Location: [facility];  Service: Ophthalmology;  Laterality: Right;  right   ESOPHAGOGASTRODUODENOSCOPY  2015   Dr. Britta Mccreedy   ESOPHAGOGASTRODUODENOSCOPY N/A 10/26/2014   RMR: Distal esophagititis-likely reflux related although an element of pill induced injuruy no exclueded  status post biopsy. Diffusely abnormal gastric mucosa of uncertain signigicane -status post gastric biopsy. Focal area of excoriation in the cardia most consistant with a trauma of heaving.    ESOPHAGOGASTRODUODENOSCOPY  08/2015   [facility]: mild esophagitis and old gastric contents   EYE SURGERY  2015   stent placed to left eye   EYE SURGERY     INCISION AND DRAINAGE OF WOUND Right 07/16/2017   Procedure: IRRIGATION AND DEBRIDEMENT OF SOFT TISSUE OF ULCERATION RIGHT FOOT;  Surgeon: Caprice Beaver, DPM;  Location: [facility];  Service: Podiatry;  Laterality: Right;     reports that he has never smoked. He has never used smokeless tobacco. He  reports that he does not drink alcohol or use drugs.  Allergies  Allergen Reactions   Donnatal [Pb-Hyoscy-Atropine-Scopolamine] Anaphylaxis    Reaction to GI cocktail   Fish Allergy Anaphylaxis, Hives and Rash   Haldol [Haloperidol Lactate] Other (See Comments)    Chest Pain   Lidocaine Anaphylaxis    Reaction to GI cocktail   Maalox  [Calcium Carbonate Antacid] Anaphylaxis    Reaction to GI cocktail   Marinol [Dronabinol]     Caused worsening vomiting.    Aspirin Hives and Itching   Bactrim [Sulfamethoxazole-Trimethoprim] Itching   Doxycycline Hives and Nausea And Vomiting    Severe    Keflex [Cephalexin] Hives, Itching and Nausea And Vomiting   Penicillins Hives and Itching    Has patient had a PCN reaction causing immediate rash, facial/tongue/throat swelling, SOB or lightheadedness with hypotension: yes Has patient had a PCN reaction causing severe rash involving mucus membranes or skin necrosis: yes Has patient had a PCN reaction that required hospitalization no Has patient had a PCN reaction occurring within the last 10 years: yes If all of the above answers are "NO", then may proceed with Cephalospor    Family History  Problem Relation Age of Onset   Ovarian cancer Mother    Heart attack Father    Cervical cancer Sister    Diabetes Sister    Colon cancer Neg Hx     Prior to Admission medications   Medication Sig Start Date End Date Taking? Authorizing Provider  atorvastatin (LIPITOR) 10 MG tablet Take 10 mg by mouth at bedtime.    Yes [provider]  Blood Glucose Monitoring Suppl (PRODIGY AUTOCODE BLOOD GLUCOSE) w/Device KIT  04/16/18  Yes [provider]  brimonidine (ALPHAGAN) 0.2 % ophthalmic solution Place 1 drop into both eyes 3 (three) times daily.    Yes [provider]  carvedilol (COREG) 25 MG tablet Take 25 mg by mouth 2 (two) times daily with a meal.   Yes [provider]  cloNIDine (CATAPRES) 0.2 MG tablet Take 0.4 mg by mouth 2 (two) times daily.    Yes [provider]  dorzolamide (TRUSOPT) 2 % ophthalmic solution INSTILL 1 DROPS INTO EACH EYE TWICE DAILY 04/23/18  Yes [provider]  folic acid (FOLVITE) 1 MG tablet Take 1 mg by mouth daily.   Yes [provider]  hydrALAZINE (APRESOLINE) 100 MG tablet Take 100 mg by  mouth 3 (three) times daily. 10/21/18  Yes [provider]  insulin aspart protamine- aspart (NOVOLOG MIX 70/30) (70-30) 100 UNIT/ML injection Inject 0.25 mLs (25 Units total) into the skin 2 (two) times daily with a meal. 09/24/17  Yes Georgette Shell, MD  lactulose (CEPHULAC) 10 g packet Take 1 packet (10 g total) by mouth every morning. 06/09/18  Yes Nena Polio, MD  LORazepam (ATIVAN) 1 MG tablet Take 1 mg by mouth 2 (two) times daily as needed. for anxiety 05/07/18  Yes [provider]  losartan (COZAAR) 25 MG tablet Take 25 mg by mouth daily. 10/21/18  Yes [provider]  metoCLOPramide (REGLAN) 5 MG tablet Take 1 tablet (5 mg total) by mouth 2 (two) times daily at 8 am and 10 pm. 09/03/18  Yes Annitta Needs, NP  pantoprazole (PROTONIX) 40 MG tablet Take 1 tablet (40 mg total) by mouth 2 (two) times daily before a meal. 08/24/18  Yes Annitta Needs, NP  potassium chloride SA (K-DUR,KLOR-CON) 20 MEQ tablet Take 1 tablet (20 mEq  total) by mouth 2 (two) times daily. Patient taking differently: Take 20 mEq by mouth daily.  11/12/17  Yes Gherghe, Vella Redhead, MD  promethazine (PHENERGAN) 25 MG tablet Take 0.5 tablets (12.5 mg total) by mouth every 8 (eight) hours as needed for nausea or vomiting. To one tablet every 6 hours 08/26/18  Yes Annitta Needs, NP  sitaGLIPtin (JANUVIA) 50 MG tablet Take 50 mg by mouth daily.   Yes [provider]  timolol (TIMOPTIC) 0.5 % ophthalmic solution Place 1 drop into both eyes 2 (two) times daily.   Yes [provider]  torsemide (DEMADEX) 20 MG tablet Take 1 tablet (20 mg total) by mouth daily. Patient taking differently: Take 40 mg by mouth daily.  06/16/18 10/24/18 Yes Sainani, Belia Heman, MD  acetaminophen-codeine 120-12 MG/5ML suspension Take 5 mLs by mouth every 6 (six) hours as needed for pain. 10/24/18   [name], MD  aMILoride (MIDAMOR) 5 MG tablet Take 1 tablet (5 mg total) by mouth daily. Patient not taking:  Reported on 09/03/2018 06/17/18 07/17/18  Henreitta Leber, MD  cyanocobalamin (,VITAMIN B-12,) 1000 MCG/ML injection INJECT 1 ML (CC) INTRAMUSCULARLY ONCE A WEEK FOR 4 WEEKS THEN MONTHLY 04/19/18   [provider]  hydrALAZINE (APRESOLINE) 100 MG tablet Take 1 tablet (100 mg total) by mouth 3 (three) times daily. 06/16/18 09/03/18  Henreitta Leber, MD  sucralfate (CARAFATE) 1 GM/10ML suspension Take 10 mLs (1 g total) by mouth 2 (two) times daily as needed. 10/24/18   [name], MD    Physical Exam: Vitals:   10/24/18 1456 10/24/18 1603 10/24/18 1815 10/24/18 1833  BP: (!) 197/100 (!) 200/100 (!) 178/101 (!) 183/98  Pulse: 87 91 80 80  Resp:    16  Temp:      SpO2: 100% 100% 100% 100%  Weight:      Height:        Constitutional: Suddenly became very anxious, and agitated attempting to get out of the bed reportedly saying that he cannot breathe- but O2 sats maintained at 100%, after calming patient down symptoms markedly improved, and he became calm. Vitals:   10/24/18 1456 10/24/18 1603 10/24/18 1815 10/24/18 1833  BP: (!) 197/100 (!) 200/100 (!) 178/101 (!) 183/98  Pulse: 87 91 80 80  Resp:    16  Temp:      SpO2: 100% 100% 100% 100%  Weight:      Height:       Eyes: Pupil equal and round, lids and conjunctivae normal. Patient is blind ENMT: Mucous membranes are moist. Posterior pharynx clear of any exudate or lesions.  Neck: normal, supple, no masses, no thyromegaly Respiratory: Initial snoring respirations while he was awake became agitated, that subsequently improved when patient calmed down, clear to auscultation bilaterally, no wheezing, no crackles. Normal respiratory effort. No accessory muscle use.  Cardiovascular: Regular rate and rhythm, no murmurs / rubs / gallops. No extremity edema. 2+ pedal pulses. No carotid bruits.  Abdomen: Mild epigatric tenderness, no masses palpated. No hepatosplenomegaly. Bowel sounds positive.  Musculoskeletal: no clubbing / cyanosis. No  joint deformity upper and lower extremities. Good ROM, no contractures. Normal muscle tone.  Skin: no rashes, lesions, ulcers. No induration Neurologic: CN 2-12 grossly intact.  Strength 5/5 in all 4.  Psychiatric: Normal judgment and insight. Alert and oriented x 3. Normal mood.   Labs on Admission: I have personally reviewed following labs and imaging studies  CBC: Recent Labs  Lab 10/24/18 1125  WBC 7.9  NEUTROABS 6.6  HGB 8.1*  HCT 24.9*  MCV 92.9  PLT 992   Basic Metabolic Panel: Recent Labs  Lab 10/24/18 1125  NA 138  K 3.4*  CL 109  CO2 21*  GLUCOSE 233*  BUN 26*  CREATININE 4.21*  CALCIUM 8.3*   Liver Function Tests: Recent Labs  Lab 10/24/18 1125  AST 21  ALT 11  ALKPHOS 49  BILITOT 0.7  PROT 6.5  ALBUMIN 3.2*   Recent Labs  Lab 10/24/18 1125  LIPASE 33   CBG: Recent Labs  Lab 10/24/18 1116  GLUCAP 204*   Urine analysis:    Component Value Date/Time   COLORURINE YELLOW 10/24/2018 1424   APPEARANCEUR CLEAR 10/24/2018 1424   LABSPEC 1.014 10/24/2018 1424   PHURINE 5.0 10/24/2018 1424   GLUCOSEU >=500 (A) 10/24/2018 1424   HGBUR MODERATE (A) 10/24/2018 1424   BILIRUBINUR NEGATIVE 10/24/2018 1424   KETONESUR NEGATIVE 10/24/2018 1424   PROTEINUR >=300 (A) 10/24/2018 1424   UROBILINOGEN 0.2 06/07/2015 1353   NITRITE NEGATIVE 10/24/2018 1424   LEUKOCYTESUR NEGATIVE 10/24/2018 1424    Radiological Exams on Admission: Dg Chest 2 View  Result Date: 10/24/2018 CLINICAL DATA:  Right EJ IV is infiltrated.  IV removed. EXAM: CHEST - 2 VIEW COMPARISON:  October 13, 2018 FINDINGS: Stable cardiomegaly. The hila and mediastinum are unremarkable. No pulmonary nodules or masses. No focal infiltrates. IMPRESSION: No active cardiopulmonary disease. Electronically Signed   By: Dorise Bullion III M.D   On: 10/24/2018 17:45   Ct Soft Tissue Neck Wo Contrast  Result Date: 10/24/2018 CLINICAL DATA:  48 year old male with neck swelling and feeling of choking  after IV infiltration. EXAM: CT NECK WITHOUT CONTRAST TECHNIQUE: Multidetector CT imaging of the neck was performed following the standard protocol without intravenous contrast. COMPARISON:  Brain MRI 06/13/2018 and earlier. [facility] chest CT 12/07/2017. FINDINGS: Pharynx and larynx: There is substantial swelling in the retropharyngeal space which measures simple fluid density and could be abnormal retropharyngeal edema, infiltrated fluid, or less likely hematoma. For the dominant portion of the collection the volume is estimated at 34 milliliters. There is mass effect on the posterior pharynx and supraglottic larynx. Only mild airway narrowing at this time (sagittal image 59 and series 2, image 62). The parapharyngeal spaces are spared. The laryngeal and pharyngeal mucosal contours appear to remain normal. Beginning at the false cords the larynx and airway appear normal. Salivary glands: Negative sublingual space. Atrophied left submandibular gland. There is inflammation in the right submandibular and parotid spaces. There is gas tracking in the right submandibular space and deep to the right sternocleidomastoid muscle. Superimposed superficial subcutaneous stranding throughout the right lateral neck with thickening of the platysma. Thyroid: Negative. Lymph nodes: No lymphadenopathy identified. Vascular: Gas between the right sternocleidomastoid muscle and carotid space. Both carotids have a mildly retropharyngeal course at the level of the superior hyoid. Vascular patency is not evaluated in the absence of IV contrast. Limited intracranial: Negative. Visualized orbits: Stable postoperative changes to both globes. Mastoids and visualized paranasal sinuses: Clear paranasal sinuses. Stable mild right mastoid effusion. Skeleton: Poor dentition throughout. Lower cervical disc and endplate degeneration. No acute osseous abnormality identified. Upper chest: There is soft tissue stranding in the right  paratracheal space of the superior mediastinum which is new from the 2019 CT (series 2, image 101). Negative visible trachea and lung apices. Other: Generalized anterior right lower neck and chest wall subcutaneous stranding. IMPRESSION: 1. Retropharyngeal low-density fluid collection  estimated at 34 mL with associated mass effect on the oro- and hypopharynx. Mild associated airway narrowing at this time (see sagittal image 59). 2. Associated mild soft tissue stranding and swelling in the superior mediastinum right paratracheal space. Normal subglottic trachea. 3. Right side deep space neck soft tissue stranding and small volume of soft tissue gas. More generalized right neck and upper chest subcutaneous stranding. Study discussed by telephone with Dr. Davonna Belling on 10/24/2018 at 17:59. He advises the patient was having IV fluids infused via a right neck IV. Therefore, I favor that the IV fluid has 3rd space throughout the neck and into the retropharynx resulting in this appearance. Interstitial and retropharyngeal bleeding would be more serious, but is not favored given the low fluid density. Electronically Signed   By: Genevie Ann M.D.   On: 10/24/2018 18:01    EKG: None  Assessment/Plan Active Problems:   Gastroparesis  Gastroparesis-vomiting, bloating, mild epigastric pain and tenderness without leukocytosis. 2Lbolus given in ED.  - Abd x-ray negative for obstruction,  possible enteritis vs early ileus -N.p.o. -gentle hydration IV fluids N/s + 20KCL 50cc/hr x 15 hrs. -Zofran PRN -Continue home PPI - BMP a.m -History of prolonged QTC, will check EKG as he is on QT prolonging medications.  Neck swelling- 2/2 infiltrated IV access.  CT neck shows retropharyngeal fluid collection with associated mass-effect on the oral and hypopharynx.  Mild associated airway narrowing.  Patient currently on room air, initial anxiety that affected patient's breathing causing stridor, suddenly improved when anxiety  was controlled. -Continous pulse ox, monitor -Continue home PRN Ativan -Sitter At bedside  AKI on CKD- recent Cr 4.21, recent check 3 months ago 3.7.  Likely from fluid loss vomiting, and Torsemide 88m daily, ARB vs  progression of renal insufficiency. -Hydrate -BMP a.m.  DM2- glucose random 233. Hgba1c 06/2018- 6.4.  -Continue home sitagliptin,  70/30 insulin - SSI- S  HTN-systolic blood pressure 1241Hto 200. -Continue home Coreg, clonidine, hydralazine,  - Hold Losartan with AKI,  Depression-  With Marked anxiety in the ED 0.5 mg Ativan given -Continue home Ativan 1 mg twice daily as needed -Sitter At bedside  DVT prophylaxis: Lovenox Code Status: Full Family Communication: Sister at bedside Disposition Plan:  1- 2 days Consults called: None Admission status: Obs, Med-surg   EBethena RoysMD [facility]  10/24/2018, 10:22 PM

## 2018-10-24 NOTE — Discharge Instructions (Signed)
Prescription for pain medicine and stomach medicine.  Clear liquids today.  Follow-up your primary care doctor.  No change in your blood work.

## 2018-10-24 NOTE — ED Notes (Signed)
EJ noted to be infiltrated  Dr Lacinda Axon in to eval   IV removed, Ice applicaiton

## 2018-10-24 NOTE — ED Notes (Signed)
Pt has had multiple IV attempts this visit by multiple nurses without success.  2 attempts by this nurse without success.  MD made aware.

## 2018-10-25 ENCOUNTER — Encounter (HOSPITAL_COMMUNITY): Payer: Self-pay

## 2018-10-25 DIAGNOSIS — N184 Chronic kidney disease, stage 4 (severe): Secondary | ICD-10-CM | POA: Diagnosis present

## 2018-10-25 DIAGNOSIS — Z79899 Other long term (current) drug therapy: Secondary | ICD-10-CM | POA: Diagnosis not present

## 2018-10-25 DIAGNOSIS — K219 Gastro-esophageal reflux disease without esophagitis: Secondary | ICD-10-CM | POA: Diagnosis present

## 2018-10-25 DIAGNOSIS — N189 Chronic kidney disease, unspecified: Secondary | ICD-10-CM

## 2018-10-25 DIAGNOSIS — R1110 Vomiting, unspecified: Secondary | ICD-10-CM

## 2018-10-25 DIAGNOSIS — F419 Anxiety disorder, unspecified: Secondary | ICD-10-CM | POA: Diagnosis present

## 2018-10-25 DIAGNOSIS — Z833 Family history of diabetes mellitus: Secondary | ICD-10-CM | POA: Diagnosis not present

## 2018-10-25 DIAGNOSIS — I129 Hypertensive chronic kidney disease with stage 1 through stage 4 chronic kidney disease, or unspecified chronic kidney disease: Secondary | ICD-10-CM | POA: Diagnosis present

## 2018-10-25 DIAGNOSIS — Z886 Allergy status to analgesic agent status: Secondary | ICD-10-CM | POA: Diagnosis not present

## 2018-10-25 DIAGNOSIS — E1151 Type 2 diabetes mellitus with diabetic peripheral angiopathy without gangrene: Secondary | ICD-10-CM | POA: Diagnosis present

## 2018-10-25 DIAGNOSIS — H409 Unspecified glaucoma: Secondary | ICD-10-CM | POA: Diagnosis present

## 2018-10-25 DIAGNOSIS — I878 Other specified disorders of veins: Secondary | ICD-10-CM | POA: Diagnosis not present

## 2018-10-25 DIAGNOSIS — R451 Restlessness and agitation: Secondary | ICD-10-CM | POA: Diagnosis not present

## 2018-10-25 DIAGNOSIS — E869 Volume depletion, unspecified: Secondary | ICD-10-CM | POA: Diagnosis present

## 2018-10-25 DIAGNOSIS — I160 Hypertensive urgency: Secondary | ICD-10-CM

## 2018-10-25 DIAGNOSIS — R112 Nausea with vomiting, unspecified: Secondary | ICD-10-CM

## 2018-10-25 DIAGNOSIS — T82898A Other specified complication of vascular prosthetic devices, implants and grafts, initial encounter: Secondary | ICD-10-CM

## 2018-10-25 DIAGNOSIS — K3184 Gastroparesis: Secondary | ICD-10-CM | POA: Diagnosis present

## 2018-10-25 DIAGNOSIS — D649 Anemia, unspecified: Secondary | ICD-10-CM | POA: Diagnosis not present

## 2018-10-25 DIAGNOSIS — Z8249 Family history of ischemic heart disease and other diseases of the circulatory system: Secondary | ICD-10-CM | POA: Diagnosis not present

## 2018-10-25 DIAGNOSIS — Z888 Allergy status to other drugs, medicaments and biological substances status: Secondary | ICD-10-CM | POA: Diagnosis not present

## 2018-10-25 DIAGNOSIS — Z794 Long term (current) use of insulin: Secondary | ICD-10-CM | POA: Diagnosis not present

## 2018-10-25 DIAGNOSIS — N179 Acute kidney failure, unspecified: Secondary | ICD-10-CM | POA: Diagnosis present

## 2018-10-25 DIAGNOSIS — R221 Localized swelling, mass and lump, neck: Secondary | ICD-10-CM | POA: Diagnosis not present

## 2018-10-25 DIAGNOSIS — E1122 Type 2 diabetes mellitus with diabetic chronic kidney disease: Secondary | ICD-10-CM | POA: Diagnosis present

## 2018-10-25 DIAGNOSIS — E1143 Type 2 diabetes mellitus with diabetic autonomic (poly)neuropathy: Secondary | ICD-10-CM | POA: Diagnosis present

## 2018-10-25 DIAGNOSIS — E113599 Type 2 diabetes mellitus with proliferative diabetic retinopathy without macular edema, unspecified eye: Secondary | ICD-10-CM

## 2018-10-25 DIAGNOSIS — Z6841 Body Mass Index (BMI) 40.0 and over, adult: Secondary | ICD-10-CM | POA: Diagnosis not present

## 2018-10-25 DIAGNOSIS — E876 Hypokalemia: Secondary | ICD-10-CM | POA: Diagnosis present

## 2018-10-25 DIAGNOSIS — E785 Hyperlipidemia, unspecified: Secondary | ICD-10-CM | POA: Diagnosis present

## 2018-10-25 LAB — BASIC METABOLIC PANEL
Anion gap: 7 (ref 5–15)
BUN: 23 mg/dL — ABNORMAL HIGH (ref 6–20)
CO2: 22 mmol/L (ref 22–32)
Calcium: 8.4 mg/dL — ABNORMAL LOW (ref 8.9–10.3)
Chloride: 112 mmol/L — ABNORMAL HIGH (ref 98–111)
Creatinine, Ser: 4.02 mg/dL — ABNORMAL HIGH (ref 0.61–1.24)
GFR calc Af Amer: 19 mL/min — ABNORMAL LOW (ref >60)
GFR calc non Af Amer: 16 mL/min — ABNORMAL LOW (ref >60)
Glucose, Bld: 159 mg/dL — ABNORMAL HIGH (ref 70–99)
Potassium: 3.4 mmol/L — ABNORMAL LOW (ref 3.5–5.1)
Sodium: 141 mmol/L (ref 135–145)

## 2018-10-25 LAB — PROTIME-INR
INR: 1.1 (ref 0.8–1.2)
Prothrombin Time: 14.4 seconds (ref 11.4–15.2)

## 2018-10-25 LAB — GLUCOSE, CAPILLARY
Glucose-Capillary: 115 mg/dL — ABNORMAL HIGH (ref 70–99)
Glucose-Capillary: 123 mg/dL — ABNORMAL HIGH (ref 70–99)
Glucose-Capillary: 141 mg/dL — ABNORMAL HIGH (ref 70–99)
Glucose-Capillary: 147 mg/dL — ABNORMAL HIGH (ref 70–99)
Glucose-Capillary: 148 mg/dL — ABNORMAL HIGH (ref 70–99)
Glucose-Capillary: 157 mg/dL — ABNORMAL HIGH (ref 70–99)
Glucose-Capillary: 192 mg/dL — ABNORMAL HIGH (ref 70–99)

## 2018-10-25 LAB — CBC
HCT: 23.6 % — ABNORMAL LOW (ref 39.0–52.0)
Hemoglobin: 7.5 g/dL — ABNORMAL LOW (ref 13.0–17.0)
MCH: 29.6 pg (ref 26.0–34.0)
MCHC: 31.8 g/dL (ref 30.0–36.0)
MCV: 93.3 fL (ref 80.0–100.0)
Platelets: 156 10*3/uL (ref 150–400)
RBC: 2.53 MIL/uL — ABNORMAL LOW (ref 4.22–5.81)
RDW: 12 % (ref 11.5–15.5)
WBC: 5.7 10*3/uL (ref 4.0–10.5)
nRBC: 0 % (ref 0.0–0.2)

## 2018-10-25 LAB — MRSA PCR SCREENING: MRSA by PCR: NEGATIVE

## 2018-10-25 LAB — MAGNESIUM: Magnesium: 1.8 mg/dL (ref 1.7–2.4)

## 2018-10-25 LAB — HEMOGLOBIN A1C
Hgb A1c MFr Bld: 7.1 % — ABNORMAL HIGH (ref 4.8–5.6)
Mean Plasma Glucose: 157.07 mg/dL

## 2018-10-25 MED ORDER — POTASSIUM CHLORIDE IN NACL 20-0.9 MEQ/L-% IV SOLN
INTRAVENOUS | Status: DC
  Administered 2018-10-25 – 2018-10-26 (×2): via INTRAVENOUS

## 2018-10-25 MED ORDER — INSULIN ASPART 100 UNIT/ML ~~LOC~~ SOLN
0.0000 [IU] | SUBCUTANEOUS | Status: DC
  Administered 2018-10-25: 2 [IU] via SUBCUTANEOUS
  Administered 2018-10-25 (×3): 1 [IU] via SUBCUTANEOUS
  Administered 2018-10-25 – 2018-10-26 (×2): 2 [IU] via SUBCUTANEOUS
  Administered 2018-10-26 (×2): 1 [IU] via SUBCUTANEOUS

## 2018-10-25 MED ORDER — CHLOROPROCAINE HCL 1 % IJ SOLN
10.0000 mL | Freq: Once | INTRAMUSCULAR | Status: AC
  Administered 2018-10-25: 10 mL
  Filled 2018-10-25: qty 30

## 2018-10-25 MED ORDER — PROMETHAZINE HCL 25 MG/ML IJ SOLN
12.5000 mg | Freq: Once | INTRAMUSCULAR | Status: AC
  Administered 2018-10-25: 12.5 mg via INTRAMUSCULAR
  Filled 2018-10-25: qty 1

## 2018-10-25 MED ORDER — FENTANYL CITRATE (PF) 100 MCG/2ML IJ SOLN
50.0000 ug | Freq: Once | INTRAMUSCULAR | Status: AC
  Administered 2018-10-25: 50 ug via INTRAVENOUS
  Filled 2018-10-25: qty 2

## 2018-10-25 MED ORDER — METOCLOPRAMIDE HCL 5 MG/ML IJ SOLN
5.0000 mg | Freq: Three times a day (TID) | INTRAMUSCULAR | Status: DC | PRN
  Administered 2018-10-25 – 2018-10-26 (×2): 5 mg via INTRAVENOUS
  Filled 2018-10-25 (×2): qty 2

## 2018-10-25 MED ORDER — SUCRALFATE 1 GM/10ML PO SUSP
1.0000 g | Freq: Two times a day (BID) | ORAL | Status: DC
  Administered 2018-10-25 – 2018-10-27 (×4): 1 g via ORAL
  Filled 2018-10-25 (×4): qty 10

## 2018-10-25 NOTE — Procedures (Signed)
Procedure Note  10/25/18   Preoperative Diagnosis:  Poor venous access and chronic renal insufficiency    Postoperative Diagnosis: Same   Procedure(s) Performed: Central Line placement, left femoral vein    Surgeon: Lanell Matar. Constance Haw, MD   Assistants: None   Anesthesia: 1% chloroprocaine (allergy to lidocaine on chart)    Complications: None    Indications: Mr. Scheck is a 48 y.o. with chronic renal insufficiency and poor venous access. He had a right sided EJ placed in the ED that infiltrated and caused some compression on his pharynx. I have been asked to place a femoral line due to the poor access and chronic kidney insufficiency and inability to use the IJ give the recent infiltration and compression. I discussed the risk and benefits of placement of the central line with the patient, including but not limited to bleeding, infection, and risk of injury to the artery.  He has given consent for the procedure.    Procedure: The patient placed supine. The left groin was prepped and draped in the usual sterile fashion.  Wearing full gown and gloves, I performed the procedure.  One percent chloroprocaine was used for local anesthesia. An ultrasound was utilized to assess the left femoral vein.  The needle with syringe was advanced into the femoral vein with dark venous return, and a wire was placed using the Seldinger technique without difficulty.  The skin was knicked and a dilator was placed, and the three lumen catheter was placed over the wire with continued control of the wire.  There was good draw back of blood from all three lumens and each flushed easily with saline.  The catheter was secured with 2-0 silk and a biopatch and dressing was placed.     The patient tolerated the procedure well.  Curlene Labrum, MD [facility] [address] [place], [place] [address] [phone] (office)

## 2018-10-25 NOTE — Progress Notes (Signed)
This RN spoke with Dr. Shanon Brow again about pts BP. Adv he has no IV access since he has been in ED and now nauseated and dry heaving. New order for Phenergen IM. Dr. Shanon Brow stated to call her if his BP did not decrease after IM phenergen.

## 2018-10-25 NOTE — Progress Notes (Signed)
PROGRESS NOTE  Timothy Clark:[id number] DOB: 12-Mar-1971 DOA: 10/24/2018 PCP: [name], MD  Brief History:  48 year old male with a history of diabetes mellitus type 2, gastroparesis, hypertension, CKD stage IV, peripheral vascular disease, morbid obesity, anxiety/depression presenting with 2-day history of intractable nausea and vomiting.  The patient denies any recent travels or sick contacts.  He has not had any fevers, chills, coughing, shortness of breath, headache, sore throat.  He denies any new medications.  He endorses compliance with his oral medications at home except for the past 2 days when he has had nausea and vomiting.  Upon presentation, the patient was noted to have serum creatinine 4.21 which was higher than his usual baseline.  He was placed on fluid resuscitation.  The patient had a peripheral IV placed and his right external jugular vein in the emergency department.  Apparently, the IV infiltrated causing some dysphagia.  CT of the neck was obtained and showed a retropharyngeal low-density fluid collection approximately 34 cc with mass-effect on the hypopharynx.  There was mild airway narrowing.  Fortunately, the patient did not have any respiratory distress and continued to have oxygen saturation 100% room air.  However, the patient did have significant anxiety and agitation requiring Ativan.  ENT was consulted by EDP who did not feel the patient needed any additional operative intervention.  Assessment/Plan: Intractable nausea and vomiting -Secondary to diabetic gastroparesis -Start Zofran around-the-clock -IV fluids -Urine drug screen -UA negative for pyuria  Infiltrated right EJ IV -Resulting in hypopharyngeal fluid collection -Presently stable on room air -Monitor clinically  Acute on chronic renal failure -due to volume depletion -baseline creatinine 2.6-2.9 in Nov 2019 -judicious IVF  Lack of IV access -Consult general surgery for femoral  central line if possible -Discussed with the patient and he is agreeable  Hypertensive urgency -Patient currently lacking IV access -Try to administer a.m. meds if the patient is able to tolerate -Holding losartan secondary to acute on chronic renal failure -Holding torsemide and amiloride  Diabetes mellitus type 2 with gastroparesis and retinopathy -06/23/2018 hemoglobin A1c 6.4 -Repeat hemoglobin A1c -NovoLog sliding scale -Holding home dose 70/30 secondary to intractable vomiting  Anxiety/depression -Continue home dose Ativan     Disposition Plan:   Home in 1-2 days  Family Communication:  No Family at bedside  Consultants:  General surgery  Code Status:  FULL   DVT Prophylaxis:  [place] Heparin    Procedures: As Listed in Progress Note Above  Antibiotics: None       Subjective: Patient is feeling a little bit better.  He still has some nausea but denies any further emesis overnight.  He denies any fevers, chills, coughing, hemoptysis, sore throat, headache.  There is no shortness of breath.  He denies any abdominal pain, dysuria, hematuria.  Last bowel movement was on 10/23/2018 p.m.  Objective: Vitals:   10/25/18 0230 10/25/18 0245 10/25/18 0558 10/25/18 0600  BP: (!) 153/83 (!) 148/76 (!) 196/97 (!) 194/99  Pulse:      Resp:      Temp:      TempSrc:      SpO2:      Weight:      Height:        Intake/Output Summary (Last 24 hours) at 10/25/2018 0736 Last data filed at 10/25/2018 0602 Gross per 24 hour  Intake 2381.1 ml  Output 600 ml  Net 1781.1 ml   Weight change:  Exam:   General:  Pt is alert, follows commands appropriately, not in acute distress  HEENT: No icterus, No thrush, No neck mass, [place]/AT  Cardiovascular: RRR, S1/S2, no rubs, no gallops  Respiratory: CTA bilaterally, no wheezing, no crackles, no rhonchi  Abdomen: Soft/+BS, non tender, non distended, no guarding  Extremities: 1 +LE edema, No lymphangitis, No petechiae, No rashes,  no synovitis   Data Reviewed: I have personally reviewed following labs and imaging studies Basic Metabolic Panel: Recent Labs  Lab 10/24/18 1125 10/25/18 0409  NA 138 141  K 3.4* 3.4*  CL 109 112*  CO2 21* 22  GLUCOSE 233* 159*  BUN 26* 23*  CREATININE 4.21* 4.02*  CALCIUM 8.3* 8.4*   Liver Function Tests: Recent Labs  Lab 10/24/18 1125  AST 21  ALT 11  ALKPHOS 49  BILITOT 0.7  PROT 6.5  ALBUMIN 3.2*   Recent Labs  Lab 10/24/18 1125  LIPASE 33   No results for input(s): AMMONIA in the last 168 hours. Coagulation Profile: No results for input(s): INR, PROTIME in the last 168 hours. CBC: Recent Labs  Lab 10/24/18 1125 10/25/18 0651  WBC 7.9 5.7  NEUTROABS 6.6  --   HGB 8.1* 7.5*  HCT 24.9* 23.6*  MCV 92.9 93.3  PLT 153 156   Cardiac Enzymes: No results for input(s): CKTOTAL, CKMB, CKMBINDEX, TROPONINI in the last 168 hours. BNP: Invalid input(s): POCBNP CBG: Recent Labs  Lab 10/24/18 1116 10/25/18 0110 10/25/18 0406  GLUCAP 204* 115* 157*   HbA1C: No results for input(s): HGBA1C in the last 72 hours. Urine analysis:    Component Value Date/Time   COLORURINE YELLOW 10/24/2018 1424   APPEARANCEUR CLEAR 10/24/2018 1424   LABSPEC 1.014 10/24/2018 1424   PHURINE 5.0 10/24/2018 1424   GLUCOSEU >=500 (A) 10/24/2018 1424   HGBUR MODERATE (A) 10/24/2018 1424   BILIRUBINUR NEGATIVE 10/24/2018 1424   KETONESUR NEGATIVE 10/24/2018 1424   PROTEINUR >=300 (A) 10/24/2018 1424   UROBILINOGEN 0.2 06/07/2015 1353   NITRITE NEGATIVE 10/24/2018 1424   LEUKOCYTESUR NEGATIVE 10/24/2018 1424   Sepsis Labs: @LABRCNTIP (procalcitonin:4,lacticidven:4) ) Recent Results (from the past 240 hour(s))  MRSA PCR Screening     Status: None   Collection Time: 10/25/18 12:56 AM  Result Value Ref Range Status   MRSA by PCR NEGATIVE NEGATIVE Final    Comment:        The GeneXpert MRSA Assay (FDA approved for NASAL specimens only), is one component of a  comprehensive MRSA colonization surveillance program. It is not intended to diagnose MRSA infection nor to guide or monitor treatment for MRSA infections. Performed at [facility], [address], [place], [place] [address]      Scheduled Meds: . atorvastatin  10 mg Oral QHS  . brimonidine  1 drop Both Eyes TID  . carvedilol  25 mg Oral BID WC  . cloNIDine  0.3 mg Oral BID  . enoxaparin (LOVENOX) injection  30 mg Subcutaneous Q24H  . hydrALAZINE  100 mg Oral Q8H  . insulin aspart  0-9 Units Subcutaneous Q4H  . linagliptin  5 mg Oral Daily  . LORazepam  0.5 mg Intravenous Once  . pantoprazole  40 mg Oral BID AC  . potassium chloride SA  20 mEq Oral Daily   Continuous Infusions: . 0.9 % NaCl with KCl 20 mEq / L      Procedures/Studies: Dg Chest 2 View  Result Date: 10/24/2018 CLINICAL DATA:  Right EJ IV is infiltrated.  IV removed.  EXAM: CHEST - 2 VIEW COMPARISON:  October 13, 2018 FINDINGS: Stable cardiomegaly. The hila and mediastinum are unremarkable. No pulmonary nodules or masses. No focal infiltrates. IMPRESSION: No active cardiopulmonary disease. Electronically Signed   By: Dorise Bullion III M.D   On: 10/24/2018 17:45   Dg Abd 1 View  Result Date: 10/24/2018 CLINICAL DATA:  Vomiting EXAM: ABDOMEN - 1 VIEW COMPARISON:  CT abdomen and pelvis October 13, 2018 FINDINGS: There is moderate stool in the colon. There is no bowel dilatation or air-fluid level to suggest bowel obstruction. Most small bowel loops are fluid-filled. No evident free air. IMPRESSION: Most small bowel loops are fluid-filled. This is a finding that may be seen normally but also may be indicative of early ileus or enteritis. Bowel obstruction is not felt to be likely. No free air appreciable. Electronically Signed   By: Lowella Grip III M.D.   On: 10/24/2018 21:37   Ct Soft Tissue Neck Wo Contrast  Result Date: 10/24/2018 CLINICAL DATA:  48 year old male with neck swelling and feeling of choking after IV  infiltration. EXAM: CT NECK WITHOUT CONTRAST TECHNIQUE: Multidetector CT imaging of the neck was performed following the standard protocol without intravenous contrast. COMPARISON:  Brain MRI 06/13/2018 and earlier. [facility] chest CT 12/07/2017. FINDINGS: Pharynx and larynx: There is substantial swelling in the retropharyngeal space which measures simple fluid density and could be abnormal retropharyngeal edema, infiltrated fluid, or less likely hematoma. For the dominant portion of the collection the volume is estimated at 34 milliliters. There is mass effect on the posterior pharynx and supraglottic larynx. Only mild airway narrowing at this time (sagittal image 59 and series 2, image 62). The parapharyngeal spaces are spared. The laryngeal and pharyngeal mucosal contours appear to remain normal. Beginning at the false cords the larynx and airway appear normal. Salivary glands: Negative sublingual space. Atrophied left submandibular gland. There is inflammation in the right submandibular and parotid spaces. There is gas tracking in the right submandibular space and deep to the right sternocleidomastoid muscle. Superimposed superficial subcutaneous stranding throughout the right lateral neck with thickening of the platysma. Thyroid: Negative. Lymph nodes: No lymphadenopathy identified. Vascular: Gas between the right sternocleidomastoid muscle and carotid space. Both carotids have a mildly retropharyngeal course at the level of the superior hyoid. Vascular patency is not evaluated in the absence of IV contrast. Limited intracranial: Negative. Visualized orbits: Stable postoperative changes to both globes. Mastoids and visualized paranasal sinuses: Clear paranasal sinuses. Stable mild right mastoid effusion. Skeleton: Poor dentition throughout. Lower cervical disc and endplate degeneration. No acute osseous abnormality identified. Upper chest: There is soft tissue stranding in the right paratracheal  space of the superior mediastinum which is new from the 2019 CT (series 2, image 101). Negative visible trachea and lung apices. Other: Generalized anterior right lower neck and chest wall subcutaneous stranding. IMPRESSION: 1. Retropharyngeal low-density fluid collection estimated at 34 mL with associated mass effect on the oro- and hypopharynx. Mild associated airway narrowing at this time (see sagittal image 59). 2. Associated mild soft tissue stranding and swelling in the superior mediastinum right paratracheal space. Normal subglottic trachea. 3. Right side deep space neck soft tissue stranding and small volume of soft tissue gas. More generalized right neck and upper chest subcutaneous stranding. Study discussed by telephone with Dr. Davonna Belling on 10/24/2018 at 17:59. He advises the patient was having IV fluids infused via a right neck IV. Therefore, I favor that the IV fluid has 3rd space throughout  the neck and into the retropharynx resulting in this appearance. Interstitial and retropharyngeal bleeding would be more serious, but is not favored given the low fluid density. Electronically Signed   By: Genevie Ann M.D.   On: 10/24/2018 18:01    Orson Eva, DO  [facility] Pager [phone]  If 7PM-7AM, please contact night-coverage [email or web address] Password [facility] 10/25/2018, 7:36 AM   LOS: 0 days

## 2018-10-25 NOTE — Progress Notes (Signed)
Pt c/o 9/10 abdominal pain. Reglan was given @ 1703- with no effect. Pt states his last BM was 10/23/18. Pt states only IV pain medication helps with his abdomen. Tylene Fantasia, NP paged to see if pain medication could be ordered for this patient. Waiting for call back/orders.

## 2018-10-25 NOTE — ED Notes (Signed)
Per Dia Crawford pt can be moved to the floor while tolerating PO liquids. Pt has no IV access. Multiple IV attempts by several nurses with no success. Pt to be evaluated by Am doctor regarding need to mid/picc line.

## 2018-10-25 NOTE — TOC Initial Note (Addendum)
Transition of Care [facility]) - Initial/Assessment Note    Patient Details  Name: Timothy Clark MRN: [id number] Date of Birth: Jun 18, 1971  Transition of Care [facility]) CM/SW Contact:    Dhalia Zingaro Dimitri Ped, LCSW Phone Number: 10/25/2018, 6:15 PM  Clinical Narrative:    Pt presented alert and oriented x4. Pt was cooperative and engaged. Pt was visited by CSW to complete TOC assessment and to address any discharge needs. Pt reported that he lived at home with his sister and father. Pt goes into detail and explains that he receives support from his sisters such as transportation. Pt shares that he does not have any DME needs although he has a year old amputation. CSW asked if he was sure to ambulate safely unassisted and pt replied "yes". CSW inquired about any DME needs to assist with ADLs. Pt reports that he does use a blood pressure cuff in the home but it does not work currently. Pt has a history of receiving home health services from [facility] agency located in [place], [place]. Pt also reports that he does have a PCP and sees a kidney specialist as well. Assigned CSW will continue to follow for D/C needs.  [place] Worker  Transitions of Care Department   Expected Discharge Plan: Home w Home Health Services Barriers to Discharge: No Barriers Identified   Patient Goals and CMS Choice Patient states their goals for this hospitalization and ongoing recovery are:: to discharge home with home health and family support      Expected Discharge Plan and Services Expected Discharge Plan: [place] Acute Care Choice: [place] arrangements for the past 2 months: Single Family Home                 DME Arranged: N/A DME Agency: [place] ([place], [place]) [place] Arranged: PT, OT, Nurse's Aide, Social Work [employer] Agency: [place] ([place], [place])  Prior Living Arrangements/Services Living arrangements for the past  2 months: [place] with:: Parents, Siblings   Do you feel safe going back to the place where you live?: Yes        Care giver support system in place?: Yes (comment)   Criminal Activity/Legal Involvement Pertinent to Current Situation/Hospitalization: No - Comment as needed  Activities of Daily Living Home Assistive Devices/Equipment: None ADL Screening (condition at time of admission) Patient's cognitive ability adequate to safely complete daily activities?: Yes Is the patient deaf or have difficulty hearing?: No Does the patient have difficulty seeing, even when wearing glasses/contacts?: Yes Does the patient have difficulty concentrating, remembering, or making decisions?: No Patient able to express need for assistance with ADLs?: Yes Does the patient have difficulty dressing or bathing?: No Independently performs ADLs?: Yes (appropriate for developmental age) Does the patient have difficulty walking or climbing stairs?: No Weakness of Legs: None Weakness of Arms/Hands: None  Permission Sought/Granted Permission sought to share information with : Family Supports, PCP Permission granted to share information with : Yes, Verbal Permission Granted([place],Eddie Sister [phone] )              Emotional Assessment Appearance:: Appears stated age, Developmentally appropriate Attitude/Demeanor/Rapport: Engaged Affect (typically observed): Accepting, Calm Orientation: : Oriented to Self, Oriented to  Time, Oriented to Place, Oriented to Situation   Psych Involvement: No (comment)  Admission diagnosis:  Intractable vomiting [R11.10] AKI (acute kidney injury) ([place]) [N17.9] Extravasation injury of IV catheter site with other  complication, initial encounter ([place]) [T82.898A] Anemia, unspecified type [D64.9] Intractable vomiting with nausea, unspecified vomiting type [R11.2] Chronic kidney disease, unspecified CKD stage [N18.9] Patient Active Problem List    Diagnosis Date Noted  . Type 2 diabetes mellitus with proliferative retinopathy ([place]) 10/25/2018  . Acute renal failure superimposed on stage 4 chronic kidney disease ([place]) 10/25/2018  . Extravasation injury of IV catheter site with other complication, initial encounter ([place])   . Intractable vomiting   . Chronic kidney disease   . Poor venous access   . Gastroparesis 10/24/2018  . HTN (hypertension), malignant 06/13/2018  . Scrotal swelling 11/10/2017  . Swelling 11/09/2017  . Scrotal edema   . Obesity, Class III, BMI 40-49.9 (morbid obesity) ([place]) 11/02/2017  . CKD stage 3 due to type 2 diabetes mellitus ([place]) 10/30/2017  . Wound eschars of foot, right  10/30/2017  . Chronic abdominal pain 09/21/2017  . Hypertensive urgency 09/21/2017  . Prolonged QT interval 09/21/2017  . GERD (gastroesophageal reflux disease) 07/14/2017  . Glaucoma 07/14/2017  . Anemia 07/14/2017  . Pressure injury of skin 02/01/2017  . Insulin dependent diabetes mellitus ([place]) 09/07/2016  . Orthostatic syncope 09/05/2016  . 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 Gastroparesis due to DM ([place]) 02/06/2015  . Essential hypertension 12/06/2014  . Diabetic macular edema ([place]) 02/18/2014  . PDR (proliferative diabetic retinopathy) ([place]) 02/06/2014  . Neovascular glaucoma of left eye 01/11/2014  . Diabetic infection of left heel ([place]) 02/14/2010  . UNSPECIFIED PERIPHERAL VASCULAR DISEASE 10/19/2009  . ERECTILE DYSFUNCTION, ORGANIC 10/19/2009  . NUMBNESS 07/20/2009  . Diabetes mellitus type 2 with complications, uncontrolled ([place]) 03/22/2009  . Hyperlipidemia 03/22/2009  . Depression 03/22/2009   PCP:  [name], MD Pharmacy:   [place], [place] - [address] [place] [address] Phone: [phone] Fax: [phone]     Social Determinants of Health ([place]) Interventions    Readmission Risk Interventions 30 Day Unplanned Readmission Risk Score      ED to Hosp-Admission (Current) from 10/24/2018 in [place]  30 Day Unplanned Readmission Risk Score (%)  28 Filed at 10/25/2018 1600     This score is the patient's risk of an unplanned readmission within 30 days of being discharged (0 -100%). The score is based on dignosis, age, lab data, medications, orders, and past utilization.   Low:  0-14.9   Medium: 15-21.9   High: 22-29.9   Extreme: 30 and above       Readmission Risk Prevention Plan 10/25/2018  Transportation Screening Complete  Palliative Care Screening Not Applicable  Some recent data might be hidden

## 2018-10-25 NOTE — Progress Notes (Signed)
This RN paged Dr. Shanon Brow and made her aware of pts BP 206/107, as well as pain level 9/10. Pt placed on q4h blood sugars, and sensitive scale insulin on prior call. Pt has no IV access d/t multiple nurses trying, as well as EJ infiltration. Micheal in ED tried IV with ultrasound and MD was aware.   Waiting on call back from Dr. Shanon Brow about pts BP as well as pain medication. Will continue to monitor pt

## 2018-10-26 DIAGNOSIS — D649 Anemia, unspecified: Secondary | ICD-10-CM

## 2018-10-26 LAB — BASIC METABOLIC PANEL
Anion gap: 6 (ref 5–15)
BUN: 21 mg/dL — ABNORMAL HIGH (ref 6–20)
CO2: 19 mmol/L — ABNORMAL LOW (ref 22–32)
Calcium: 8.1 mg/dL — ABNORMAL LOW (ref 8.9–10.3)
Chloride: 113 mmol/L — ABNORMAL HIGH (ref 98–111)
Creatinine, Ser: 3.69 mg/dL — ABNORMAL HIGH (ref 0.61–1.24)
GFR calc Af Amer: 21 mL/min — ABNORMAL LOW (ref >60)
GFR calc non Af Amer: 18 mL/min — ABNORMAL LOW (ref >60)
Glucose, Bld: 134 mg/dL — ABNORMAL HIGH (ref 70–99)
Potassium: 3.5 mmol/L (ref 3.5–5.1)
Sodium: 138 mmol/L (ref 135–145)

## 2018-10-26 LAB — GLUCOSE, CAPILLARY
Glucose-Capillary: 118 mg/dL — ABNORMAL HIGH (ref 70–99)
Glucose-Capillary: 126 mg/dL — ABNORMAL HIGH (ref 70–99)
Glucose-Capillary: 177 mg/dL — ABNORMAL HIGH (ref 70–99)
Glucose-Capillary: 140 mg/dL — ABNORMAL HIGH (ref 70–99)
Glucose-Capillary: 148 mg/dL — ABNORMAL HIGH (ref 70–99)

## 2018-10-26 LAB — MAGNESIUM: Magnesium: 1.8 mg/dL (ref 1.7–2.4)

## 2018-10-26 MED ORDER — INSULIN ASPART 100 UNIT/ML ~~LOC~~ SOLN
0.0000 [IU] | Freq: Three times a day (TID) | SUBCUTANEOUS | Status: DC
  Administered 2018-10-26: 2 [IU] via SUBCUTANEOUS
  Administered 2018-10-27: 3 [IU] via SUBCUTANEOUS

## 2018-10-26 MED ORDER — CHLORHEXIDINE GLUCONATE CLOTH 2 % EX PADS
6.0000 | MEDICATED_PAD | Freq: Every day | CUTANEOUS | Status: DC
  Administered 2018-10-26: 6 via TOPICAL

## 2018-10-26 MED ORDER — INSULIN ASPART 100 UNIT/ML ~~LOC~~ SOLN: 0.0000 [IU] | Freq: Every day | SUBCUTANEOUS | Status: DC

## 2018-10-26 MED ORDER — SODIUM CHLORIDE 0.9% FLUSH
10.0000 mL | Freq: Two times a day (BID) | INTRAVENOUS | Status: DC
  Administered 2018-10-26 (×2): 10 mL
  Administered 2018-10-27: 20 mL

## 2018-10-26 MED ORDER — ENOXAPARIN SODIUM 30 MG/0.3ML ~~LOC~~ SOLN
30.0000 mg | SUBCUTANEOUS | Status: DC
  Administered 2018-10-26: 30 mg via SUBCUTANEOUS
  Filled 2018-10-26: qty 0.3

## 2018-10-26 MED ORDER — POTASSIUM CHLORIDE IN NACL 20-0.9 MEQ/L-% IV SOLN
INTRAVENOUS | Status: DC
  Administered 2018-10-26 – 2018-10-27 (×2): via INTRAVENOUS

## 2018-10-26 MED ORDER — ENOXAPARIN SODIUM 60 MG/0.6ML ~~LOC~~ SOLN: 60.0000 mg | SUBCUTANEOUS | Status: DC

## 2018-10-26 MED ORDER — AMLODIPINE BESYLATE 5 MG PO TABS
5.0000 mg | ORAL_TABLET | Freq: Every day | ORAL | Status: DC
  Administered 2018-10-26 – 2018-10-27 (×2): 5 mg via ORAL
  Filled 2018-10-26 (×2): qty 1

## 2018-10-26 MED ORDER — SODIUM CHLORIDE 0.9% FLUSH: 10.0000 mL | INTRAVENOUS | Status: DC | PRN

## 2018-10-26 MED ORDER — MORPHINE SULFATE (PF) 2 MG/ML IV SOLN
2.0000 mg | Freq: Once | INTRAVENOUS | Status: AC
  Administered 2018-10-26: 2 mg via INTRAVENOUS
  Filled 2018-10-26: qty 1

## 2018-10-26 NOTE — Progress Notes (Signed)
PROGRESS NOTE  Timothy Clark CHY:[id number] DOB: [birth year] DOA: 10/24/2018 PCP: [name], MD  Brief History:  48 year old male with a history of diabetes mellitus type 2, gastroparesis, hypertension, CKD stage IV, peripheral vascular disease, morbid obesity, anxiety/depression presenting with 2-day history of intractable nausea and vomiting.  The patient denies any recent travels or sick contacts.  He has not had any fevers, chills, coughing, shortness of breath, headache, sore throat.  He denies any new medications.  He endorses compliance with his oral medications at home except for the past 2 days when he has had nausea and vomiting.  Upon presentation, the patient was noted to have serum creatinine 4.21 which was higher than his usual baseline.  He was placed on fluid resuscitation.  The patient had a peripheral IV placed and his right external jugular vein in the emergency department.  Apparently, the IV infiltrated causing some dysphagia.  CT of the neck was obtained and showed a retropharyngeal low-density fluid collection approximately 34 cc with mass-effect on the hypopharynx.  There was mild airway narrowing.  Fortunately, the patient did not have any respiratory distress and continued to have oxygen saturation 100% room air.  However, the patient did have significant anxiety and agitation requiring Ativan.  ENT was consulted by EDP who did not feel the patient needed any additional operative intervention.  Assessment/Plan: Intractable nausea and vomiting -Secondary to diabetic gastroparesis -Continue zofran -improving -clears>>soft diet -IV fluids -Urine drug screen -UA negative for pyuria  Infiltrated right EJ IV -Resulting in hypopharyngeal fluid collection -Presently stable on room air -Monitor clinically -pt without respiratory distress or dyphagia  Acute on chronic renal failure--CKD 4 -due to volume depletion -baseline creatinine 2.6-2.9 in Nov  2019 -judicious IVF-continue  Lack of IV access -Consult general surgery for femoral central line if possible -Discussed with the patient and he is agreeable -femoral line placed 10/25/18  Hypertensive urgency -continue coreg, clonidine, hydralazine -add amlodipine -Holding losartan secondary to acute on chronic renal failure -Holding torsemide and amiloride  Diabetes mellitus type 2 with gastroparesis and retinopathy -06/23/2018 hemoglobin A1c 6.4 -Repeat hemoglobin A1c--7.1 -NovoLog sliding scale -Holding home dose 70/30 secondary to intractable vomiting  Anxiety/depression -Continue home dose Ativan     Disposition Plan:   Home 3/18 if stable and tolerates diet Family Communication:  No Family at bedside  Consultants:  General surgery  Code Status:  FULL   DVT Prophylaxis:  [place] Heparin    Procedures: As Listed in Progress Note Above  Antibiotics: None     Subjective: Patient denies fevers, chills, headache, chest pain, dyspnea, nausea, vomiting, diarrhea, abdominal pain, dysuria, hematuria, hematochezia, and melena.   Objective: Vitals:   10/26/18 1000 10/26/18 1200 10/26/18 1300 10/26/18 1600  BP: (!) 162/86 (!) 149/90 (!) 168/86   Pulse: 63 67 65   Resp: 15 14 12    Temp:  97.9 F (36.6 C)  97.8 F (36.6 C)  TempSrc:  Oral  Oral  SpO2: 98% 100% 99%   Weight:      Height:        Intake/Output Summary (Last 24 hours) at 10/26/2018 1649 Last data filed at 10/26/2018 1200 Gross per 24 hour  Intake 2361.28 ml  Output 800 ml  Net 1561.28 ml   Weight change: -7.079 kg Exam:   General:  Pt is alert, follows commands appropriately, not in acute distress  HEENT: No icterus, No thrush, No neck mass, [place]/AT  Cardiovascular: RRR, S1/S2, no rubs, no gallops  Respiratory: CTA bilaterally, no wheezing, no crackles, no rhonchi  Abdomen: Soft/+BS, non tender, non distended, no guarding  Extremities: No edema, No lymphangitis, No  petechiae, No rashes, no synovitis   Data Reviewed: I have personally reviewed following labs and imaging studies Basic Metabolic Panel: Recent Labs  Lab 10/24/18 1125 10/25/18 0409 10/25/18 0651 10/26/18 0528  NA 138 141  --  138  K 3.4* 3.4*  --  3.5  CL 109 112*  --  113*  CO2 21* 22  --  19*  GLUCOSE 233* 159*  --  134*  BUN 26* 23*  --  21*  CREATININE 4.21* 4.02*  --  3.69*  CALCIUM 8.3* 8.4*  --  8.1*  MG  --   --  1.8 1.8   Liver Function Tests: Recent Labs  Lab 10/24/18 1125  AST 21  ALT 11  ALKPHOS 49  BILITOT 0.7  PROT 6.5  ALBUMIN 3.2*   Recent Labs  Lab 10/24/18 1125  LIPASE 33   No results for input(s): AMMONIA in the last 168 hours. Coagulation Profile: Recent Labs  Lab 10/25/18 0842  INR 1.1   CBC: Recent Labs  Lab 10/24/18 1125 10/25/18 0651  WBC 7.9 5.7  NEUTROABS 6.6  --   HGB 8.1* 7.5*  HCT 24.9* 23.6*  MCV 92.9 93.3  PLT 153 156   Cardiac Enzymes: No results for input(s): CKTOTAL, CKMB, CKMBINDEX, TROPONINI in the last 168 hours. BNP: Invalid input(s): POCBNP CBG: Recent Labs  Lab 10/25/18 2348 10/26/18 0424 10/26/18 0739 10/26/18 1135 10/26/18 1606  GLUCAP 123* 118* 126* 177* 140*   HbA1C: Recent Labs    10/25/18 0651  HGBA1C 7.1*   Urine analysis:    Component Value Date/Time   COLORURINE YELLOW 10/24/2018 1424   APPEARANCEUR CLEAR 10/24/2018 1424   LABSPEC 1.014 10/24/2018 1424   PHURINE 5.0 10/24/2018 1424   GLUCOSEU >=500 (A) 10/24/2018 1424   HGBUR MODERATE (A) 10/24/2018 1424   BILIRUBINUR NEGATIVE 10/24/2018 1424   KETONESUR NEGATIVE 10/24/2018 1424   PROTEINUR >=300 (A) 10/24/2018 1424   UROBILINOGEN 0.2 06/07/2015 1353   NITRITE NEGATIVE 10/24/2018 1424   LEUKOCYTESUR NEGATIVE 10/24/2018 1424   Sepsis Labs: @LABRCNTIP (procalcitonin:4,lacticidven:4) ) Recent Results (from the past 240 hour(s))  MRSA PCR Screening     Status: None   Collection Time: 10/25/18 12:56 AM  Result Value Ref Range  Status   MRSA by PCR NEGATIVE NEGATIVE Final    Comment:        The GeneXpert MRSA Assay (FDA approved for NASAL specimens only), is one component of a comprehensive MRSA colonization surveillance program. It is not intended to diagnose MRSA infection nor to guide or monitor treatment for MRSA infections. Performed at [facility], [address], [place], [place] [address]      Scheduled Meds:  atorvastatin  10 mg Oral QHS   brimonidine  1 drop Both Eyes TID   carvedilol  25 mg Oral BID WC   Chlorhexidine Gluconate Cloth  6 each Topical Daily   cloNIDine  0.3 mg Oral BID   enoxaparin (LOVENOX) injection  30 mg Subcutaneous Q24H   hydrALAZINE  100 mg Oral Q8H   insulin aspart  0-9 Units Subcutaneous Q4H   linagliptin  5 mg Oral Daily   LORazepam  0.5 mg Intravenous Once   pantoprazole  40 mg Oral BID AC   sodium chloride flush  10-40 mL Intracatheter Q12H  sucralfate  1 g Oral BID   Continuous Infusions:  0.9 % NaCl with KCl 20 mEq / L 75 mL/hr at 10/26/18 1200    Procedures/Studies: Dg Chest 2 View  Result Date: 10/24/2018 CLINICAL DATA:  Right EJ IV is infiltrated.  IV removed. EXAM: CHEST - 2 VIEW COMPARISON:  October 13, 2018 FINDINGS: Stable cardiomegaly. The hila and mediastinum are unremarkable. No pulmonary nodules or masses. No focal infiltrates. IMPRESSION: No active cardiopulmonary disease. Electronically Signed   By: Dorise Bullion III M.D   On: 10/24/2018 17:45   Dg Abd 1 View  Result Date: 10/24/2018 CLINICAL DATA:  Vomiting EXAM: ABDOMEN - 1 VIEW COMPARISON:  CT abdomen and pelvis October 13, 2018 FINDINGS: There is moderate stool in the colon. There is no bowel dilatation or air-fluid level to suggest bowel obstruction. Most small bowel loops are fluid-filled. No evident free air. IMPRESSION: Most small bowel loops are fluid-filled. This is a finding that may be seen normally but also may be indicative of early ileus or enteritis. Bowel obstruction  is not felt to be likely. No free air appreciable. Electronically Signed   By: Lowella Grip III M.D.   On: 10/24/2018 21:37   Ct Soft Tissue Neck Wo Contrast  Result Date: 10/24/2018 CLINICAL DATA:  48 year old male with neck swelling and feeling of choking after IV infiltration. EXAM: CT NECK WITHOUT CONTRAST TECHNIQUE: Multidetector CT imaging of the neck was performed following the standard protocol without intravenous contrast. COMPARISON:  Brain MRI 06/13/2018 and earlier. [facility] chest CT 12/07/2017. FINDINGS: Pharynx and larynx: There is substantial swelling in the retropharyngeal space which measures simple fluid density and could be abnormal retropharyngeal edema, infiltrated fluid, or less likely hematoma. For the dominant portion of the collection the volume is estimated at 34 milliliters. There is mass effect on the posterior pharynx and supraglottic larynx. Only mild airway narrowing at this time (sagittal image 59 and series 2, image 62). The parapharyngeal spaces are spared. The laryngeal and pharyngeal mucosal contours appear to remain normal. Beginning at the false cords the larynx and airway appear normal. Salivary glands: Negative sublingual space. Atrophied left submandibular gland. There is inflammation in the right submandibular and parotid spaces. There is gas tracking in the right submandibular space and deep to the right sternocleidomastoid muscle. Superimposed superficial subcutaneous stranding throughout the right lateral neck with thickening of the platysma. Thyroid: Negative. Lymph nodes: No lymphadenopathy identified. Vascular: Gas between the right sternocleidomastoid muscle and carotid space. Both carotids have a mildly retropharyngeal course at the level of the superior hyoid. Vascular patency is not evaluated in the absence of IV contrast. Limited intracranial: Negative. Visualized orbits: Stable postoperative changes to both globes. Mastoids and visualized  paranasal sinuses: Clear paranasal sinuses. Stable mild right mastoid effusion. Skeleton: Poor dentition throughout. Lower cervical disc and endplate degeneration. No acute osseous abnormality identified. Upper chest: There is soft tissue stranding in the right paratracheal space of the superior mediastinum which is new from the 2019 CT (series 2, image 101). Negative visible trachea and lung apices. Other: Generalized anterior right lower neck and chest wall subcutaneous stranding. IMPRESSION: 1. Retropharyngeal low-density fluid collection estimated at 34 mL with associated mass effect on the oro- and hypopharynx. Mild associated airway narrowing at this time (see sagittal image 59). 2. Associated mild soft tissue stranding and swelling in the superior mediastinum right paratracheal space. Normal subglottic trachea. 3. Right side deep space neck soft tissue stranding and small volume of soft  tissue gas. More generalized right neck and upper chest subcutaneous stranding. Study discussed by telephone with Dr. Davonna Belling on 10/24/2018 at 17:59. He advises the patient was having IV fluids infused via a right neck IV. Therefore, I favor that the IV fluid has 3rd space throughout the neck and into the retropharynx resulting in this appearance. Interstitial and retropharyngeal bleeding would be more serious, but is not favored given the low fluid density. Electronically Signed   By: Genevie Ann M.D.   On: 10/24/2018 18:01    Orson Eva, DO  [facility] Pager [phone]  If 7PM-7AM, please contact night-coverage [email or web address] Password [email or web address] 10/26/2018, 4:49 PM   LOS: 1 day

## 2018-10-27 LAB — BASIC METABOLIC PANEL
Anion gap: 6 (ref 5–15)
BUN: 19 mg/dL (ref 6–20)
CO2: 21 mmol/L — ABNORMAL LOW (ref 22–32)
Calcium: 8.2 mg/dL — ABNORMAL LOW (ref 8.9–10.3)
Chloride: 112 mmol/L — ABNORMAL HIGH (ref 98–111)
Creatinine, Ser: 3.57 mg/dL — ABNORMAL HIGH (ref 0.61–1.24)
GFR calc Af Amer: 22 mL/min — ABNORMAL LOW (ref >60)
GFR calc non Af Amer: 19 mL/min — ABNORMAL LOW (ref >60)
Glucose, Bld: 133 mg/dL — ABNORMAL HIGH (ref 70–99)
Potassium: 3.7 mmol/L (ref 3.5–5.1)
Sodium: 139 mmol/L (ref 135–145)

## 2018-10-27 LAB — GLUCOSE, CAPILLARY
Glucose-Capillary: 157 mg/dL — ABNORMAL HIGH (ref 70–99)
Glucose-Capillary: 158 mg/dL — ABNORMAL HIGH (ref 70–99)

## 2018-10-27 MED ORDER — AMLODIPINE BESYLATE 10 MG PO TABS: 10.0000 mg | ORAL_TABLET | Freq: Every day | ORAL | 1 refills | Status: DC

## 2018-10-27 MED ORDER — AMLODIPINE BESYLATE 5 MG PO TABS: 10.0000 mg | ORAL_TABLET | Freq: Every day | ORAL | Status: DC

## 2018-10-27 NOTE — Discharge Summary (Signed)
Physician Discharge Summary  Timothy Clark QQP:[id number] DOB: 07/21/1971 DOA: 10/24/2018  PCP: [name], MD  Admit date: 10/24/2018 Discharge date: 10/27/2018  Admitted From:Home Disposition:  Home   Recommendations for Outpatient Follow-up:  1. Follow up with PCP in 1-2 weeks 2. Please obtain BMP/CBC in one week     Discharge Condition: Stable CODE STATUS: FULL Diet recommendation: Heart Healthy / Carb Modified   Brief/Interim Summary: 48 year old male with a history of diabetes mellitus type 2, gastroparesis, hypertension, CKD stage IV, peripheral vascular disease, morbid obesity, anxiety/depression presenting with 2-day history of intractable nausea and vomiting. The patient denies any recent travels or sick contacts. He has not had any fevers, chills, coughing, shortness of breath, headache, sore throat. He denies any new medications. He endorses compliance with his oral medications at home except for the past 2 days when he has had nausea and vomiting. Upon presentation, the patient was noted to have serum creatinine 4.21 which was higher than his usual baseline. He was placed on fluid resuscitation. The patient had a peripheral IV placed and his right external jugular vein in the emergency department. Apparently, the IV infiltrated causing some dysphagia. CT of the neck was obtained and showed a retropharyngeal low-density fluid collection approximately 34 cc with mass-effect on the hypopharynx. There was mild airway narrowing. Fortunately, the patient did not have any respiratory distress and continued to have oxygen saturation 100% room air. However, the patient did have significant anxiety and agitation requiring Ativan. ENT was consulted by EDP who did not feel the patient needed any additional operative intervention.  Discharge Diagnoses:  Intractable nausea and vomiting -Secondary to diabetic gastroparesis exacerbation -Continued zofran and sulcrafate  during hospitalization -improving -clears>>soft diet which pt tolerated without vomiting or worsen abd pain -IV fluids -Urine drug screen--was not collected -UA negative for pyuria  Infiltrated right EJ IV -Resulting in hypopharyngeal fluid collection -Presently stable on room air -Monitor clinically -pt without respiratory distress or dyphagia -stable on RA and tolerating diet  Acute on chronic renal failure--CKD 4 -due to volume depletion -baseline creatinine 2.6-2.9 in Nov 2019 -likely now has new baseline 3.5-3.7 with progression of CKD -judicious IVF-continued -serum creatinine 3.57 on day of d/c -d/c losartan  Lack of IV access -Consult general surgery for femoral central line if possible -Discussed with the patient and he is agreeable -femoral line placed 10/25/18-->discontinued on 10/27/18  Hypertensive urgency -continue coreg, clonidine, hydralazine -add amlodipine-->increased to 10 mg daily -Holding losartan secondary to acute on chronic renal failure -Holding torsemide and amiloride -restart torsemide after d/c--follow with renal after d/c  Diabetes mellitus type 2 with gastroparesisand retinopathy -06/23/2018 hemoglobin A1c 6.4 -Repeat hemoglobin A1c--7.1 -NovoLog sliding scale -Holding home dose 70/30 secondary to intractable vomiting-->restart after d/c  Anxiety/depression -Continue home dose Ativan     Discharge Instructions   Allergies as of 10/27/2018      Reactions   Donnatal [pb-hyoscy-atropine-scopolamine] Anaphylaxis   Reaction to GI cocktail   Fish Allergy Anaphylaxis, Hives, Rash   Haldol [haloperidol Lactate] Other (See Comments)   Chest Pain   Lidocaine Anaphylaxis   Reaction to GI cocktail   Maalox [calcium Carbonate Antacid] Anaphylaxis   Reaction to GI cocktail   Marinol [dronabinol]    Caused worsening vomiting.    Aspirin Hives, Itching   Bactrim [sulfamethoxazole-trimethoprim] Itching   Doxycycline Hives, Nausea And  Vomiting   Severe    Keflex [cephalexin] Hives, Itching, Nausea And Vomiting   Penicillins Hives, Itching   Has  patient had a PCN reaction causing immediate rash, facial/tongue/throat swelling, SOB or lightheadedness with hypotension: yes Has patient had a PCN reaction causing severe rash involving mucus membranes or skin necrosis: yes Has patient had a PCN reaction that required hospitalization no Has patient had a PCN reaction occurring within the last 10 years: yes If all of the above answers are "NO", then may proceed with Cephalospor      Medication List    STOP taking these medications   aMILoride 5 MG tablet Commonly known as:  MIDAMOR   losartan 25 MG tablet Commonly known as:  COZAAR   sucralfate 1 g tablet Commonly known as:  CARAFATE Replaced by:  sucralfate 1 GM/10ML suspension     TAKE these medications   acetaminophen-codeine 120-12 MG/5ML suspension Take 5 mLs by mouth every 6 (six) hours as needed for pain.   amLODipine 10 MG tablet Commonly known as:  NORVASC Take 1 tablet (10 mg total) by mouth daily. Start taking on:  October 28, 2018   atorvastatin 10 MG tablet Commonly known as:  LIPITOR Take 10 mg by mouth at bedtime.   brimonidine 0.2 % ophthalmic solution Commonly known as:  ALPHAGAN Place 1 drop into both eyes 3 (three) times daily.   carvedilol 25 MG tablet Commonly known as:  COREG Take 25 mg by mouth 2 (two) times daily with a meal.   cloNIDine 0.2 MG tablet Commonly known as:  CATAPRES Take 0.4 mg by mouth 2 (two) times daily.   cyanocobalamin 1000 MCG/ML injection Commonly known as:  (VITAMIN B-12) INJECT 1 ML (CC) INTRAMUSCULARLY ONCE A WEEK FOR 4 WEEKS THEN MONTHLY   dorzolamide 2 % ophthalmic solution Commonly known as:  TRUSOPT INSTILL 1 DROPS INTO EACH EYE TWICE DAILY   folic acid 1 MG tablet Commonly known as:  FOLVITE Take 1 mg by mouth daily.   hydrALAZINE 100 MG tablet Commonly known as:  APRESOLINE Take 100 mg by  mouth 3 (three) times daily. What changed:  Another medication with the same name was removed. Continue taking this medication, and follow the directions you see here.   insulin aspart protamine- aspart (70-30) 100 UNIT/ML injection Commonly known as:  NOVOLOG MIX 70/30 Inject 0.25 mLs (25 Units total) into the skin 2 (two) times daily with a meal.   lactulose 10 g packet Commonly known as:  CEPHULAC Take 1 packet (10 g total) by mouth every morning.   LORazepam 1 MG tablet Commonly known as:  ATIVAN Take 1 mg by mouth 2 (two) times daily as needed. for anxiety   metoCLOPramide 5 MG tablet Commonly known as:  Reglan Take 1 tablet (5 mg total) by mouth 2 (two) times daily at 8 am and 10 pm.   pantoprazole 40 MG tablet Commonly known as:  PROTONIX Take 1 tablet (40 mg total) by mouth 2 (two) times daily before a meal.   potassium chloride SA 20 MEQ tablet Commonly known as:  K-DUR,KLOR-CON Take 1 tablet (20 mEq total) by mouth 2 (two) times daily. What changed:  when to take this   Prodigy Autocode Blood Glucose w/Device Kit   promethazine 25 MG tablet Commonly known as:  PHENERGAN Take 0.5 tablets (12.5 mg total) by mouth every 8 (eight) hours as needed for nausea or vomiting. To one tablet every 6 hours   sitaGLIPtin 50 MG tablet Commonly known as:  JANUVIA Take 50 mg by mouth daily.   sucralfate 1 GM/10ML suspension Commonly known as:  Carafate  Take 10 mLs (1 g total) by mouth 2 (two) times daily as needed. Replaces:  sucralfate 1 g tablet   timolol 0.5 % ophthalmic solution Commonly known as:  TIMOPTIC Place 1 drop into both eyes 2 (two) times daily.   torsemide 20 MG tablet Commonly known as:  DEMADEX Take 1 tablet (20 mg total) by mouth daily. What changed:  how much to take       Allergies  Allergen Reactions   Donnatal [Pb-Hyoscy-Atropine-Scopolamine] Anaphylaxis    Reaction to GI cocktail   Fish Allergy Anaphylaxis, Hives and Rash   Haldol  [Haloperidol Lactate] Other (See Comments)    Chest Pain   Lidocaine Anaphylaxis    Reaction to GI cocktail   Maalox [Calcium Carbonate Antacid] Anaphylaxis    Reaction to GI cocktail   Marinol [Dronabinol]     Caused worsening vomiting.    Aspirin Hives and Itching   Bactrim [Sulfamethoxazole-Trimethoprim] Itching   Doxycycline Hives and Nausea And Vomiting    Severe    Keflex [Cephalexin] Hives, Itching and Nausea And Vomiting   Penicillins Hives and Itching    Has patient had a PCN reaction causing immediate rash, facial/tongue/throat swelling, SOB or lightheadedness with hypotension: yes Has patient had a PCN reaction causing severe rash involving mucus membranes or skin necrosis: yes Has patient had a PCN reaction that required hospitalization no Has patient had a PCN reaction occurring within the last 10 years: yes If all of the above answers are "NO", then may proceed with Cephalospor    Consultations:  none   Procedures/Studies: Dg Chest 2 View  Result Date: 10/24/2018 CLINICAL DATA:  Right EJ IV is infiltrated.  IV removed. EXAM: CHEST - 2 VIEW COMPARISON:  October 13, 2018 FINDINGS: Stable cardiomegaly. The hila and mediastinum are unremarkable. No pulmonary nodules or masses. No focal infiltrates. IMPRESSION: No active cardiopulmonary disease. Electronically Signed   By: Dorise Bullion III M.D   On: 10/24/2018 17:45   Dg Abd 1 View  Result Date: 10/24/2018 CLINICAL DATA:  Vomiting EXAM: ABDOMEN - 1 VIEW COMPARISON:  CT abdomen and pelvis October 13, 2018 FINDINGS: There is moderate stool in the colon. There is no bowel dilatation or air-fluid level to suggest bowel obstruction. Most small bowel loops are fluid-filled. No evident free air. IMPRESSION: Most small bowel loops are fluid-filled. This is a finding that may be seen normally but also may be indicative of early ileus or enteritis. Bowel obstruction is not felt to be likely. No free air appreciable.  Electronically Signed   By: Lowella Grip III M.D.   On: 10/24/2018 21:37   Ct Soft Tissue Neck Wo Contrast  Result Date: 10/24/2018 CLINICAL DATA:  48 year old male with neck swelling and feeling of choking after IV infiltration. EXAM: CT NECK WITHOUT CONTRAST TECHNIQUE: Multidetector CT imaging of the neck was performed following the standard protocol without intravenous contrast. COMPARISON:  Brain MRI 06/13/2018 and earlier. [facility] chest CT 12/07/2017. FINDINGS: Pharynx and larynx: There is substantial swelling in the retropharyngeal space which measures simple fluid density and could be abnormal retropharyngeal edema, infiltrated fluid, or less likely hematoma. For the dominant portion of the collection the volume is estimated at 34 milliliters. There is mass effect on the posterior pharynx and supraglottic larynx. Only mild airway narrowing at this time (sagittal image 59 and series 2, image 62). The parapharyngeal spaces are spared. The laryngeal and pharyngeal mucosal contours appear to remain normal. Beginning at the false cords the  larynx and airway appear normal. Salivary glands: Negative sublingual space. Atrophied left submandibular gland. There is inflammation in the right submandibular and parotid spaces. There is gas tracking in the right submandibular space and deep to the right sternocleidomastoid muscle. Superimposed superficial subcutaneous stranding throughout the right lateral neck with thickening of the platysma. Thyroid: Negative. Lymph nodes: No lymphadenopathy identified. Vascular: Gas between the right sternocleidomastoid muscle and carotid space. Both carotids have a mildly retropharyngeal course at the level of the superior hyoid. Vascular patency is not evaluated in the absence of IV contrast. Limited intracranial: Negative. Visualized orbits: Stable postoperative changes to both globes. Mastoids and visualized paranasal sinuses: Clear paranasal sinuses. Stable  mild right mastoid effusion. Skeleton: Poor dentition throughout. Lower cervical disc and endplate degeneration. No acute osseous abnormality identified. Upper chest: There is soft tissue stranding in the right paratracheal space of the superior mediastinum which is new from the 2019 CT (series 2, image 101). Negative visible trachea and lung apices. Other: Generalized anterior right lower neck and chest wall subcutaneous stranding. IMPRESSION: 1. Retropharyngeal low-density fluid collection estimated at 34 mL with associated mass effect on the oro- and hypopharynx. Mild associated airway narrowing at this time (see sagittal image 59). 2. Associated mild soft tissue stranding and swelling in the superior mediastinum right paratracheal space. Normal subglottic trachea. 3. Right side deep space neck soft tissue stranding and small volume of soft tissue gas. More generalized right neck and upper chest subcutaneous stranding. Study discussed by telephone with Dr. Davonna Belling on 10/24/2018 at 17:59. He advises the patient was having IV fluids infused via a right neck IV. Therefore, I favor that the IV fluid has 3rd space throughout the neck and into the retropharynx resulting in this appearance. Interstitial and retropharyngeal bleeding would be more serious, but is not favored given the low fluid density. Electronically Signed   By: Genevie Ann M.D.   On: 10/24/2018 18:01        Discharge Exam: Vitals:   10/26/18 2128 10/27/18 0532  BP: (!) 158/94 (!) 159/87  Pulse: 68 70  Resp: 20 18  Temp: 98.5 F (36.9 C) 98.5 F (36.9 C)  SpO2: 100% 100%   Vitals:   10/26/18 1600 10/26/18 1701 10/26/18 2128 10/27/18 0532  BP: (!) 165/103 (!) 161/97 (!) 158/94 (!) 159/87  Pulse: 63 70 68 70  Resp: '13 20 20 18  ' Temp: 97.8 F (36.6 C)  98.5 F (36.9 C) 98.5 F (36.9 C)  TempSrc: Oral  Oral Oral  SpO2: 100% 100% 100% 100%  Weight:      Height:        General: Pt is alert, awake, not in acute  distress Cardiovascular: RRR, S1/S2 +, no rubs, no gallops Respiratory: CTA bilaterally, no wheezing, no rhonchi Abdominal: Soft, NT, ND, bowel sounds + Extremities: 1 + LE edema, no cyanosis   The results of significant diagnostics from this hospitalization (including imaging, microbiology, ancillary and laboratory) are listed below for reference.    Significant Diagnostic Studies: Dg Chest 2 View  Result Date: 10/24/2018 CLINICAL DATA:  Right EJ IV is infiltrated.  IV removed. EXAM: CHEST - 2 VIEW COMPARISON:  October 13, 2018 FINDINGS: Stable cardiomegaly. The hila and mediastinum are unremarkable. No pulmonary nodules or masses. No focal infiltrates. IMPRESSION: No active cardiopulmonary disease. Electronically Signed   By: Dorise Bullion III M.D   On: 10/24/2018 17:45   Dg Abd 1 View  Result Date: 10/24/2018 CLINICAL DATA:  Vomiting EXAM: ABDOMEN -  1 VIEW COMPARISON:  CT abdomen and pelvis October 13, 2018 FINDINGS: There is moderate stool in the colon. There is no bowel dilatation or air-fluid level to suggest bowel obstruction. Most small bowel loops are fluid-filled. No evident free air. IMPRESSION: Most small bowel loops are fluid-filled. This is a finding that may be seen normally but also may be indicative of early ileus or enteritis. Bowel obstruction is not felt to be likely. No free air appreciable. Electronically Signed   By: Lowella Grip III M.D.   On: 10/24/2018 21:37   Ct Soft Tissue Neck Wo Contrast  Result Date: 10/24/2018 CLINICAL DATA:  48 year old male with neck swelling and feeling of choking after IV infiltration. EXAM: CT NECK WITHOUT CONTRAST TECHNIQUE: Multidetector CT imaging of the neck was performed following the standard protocol without intravenous contrast. COMPARISON:  Brain MRI 06/13/2018 and earlier. [facility] chest CT 12/07/2017. FINDINGS: Pharynx and larynx: There is substantial swelling in the retropharyngeal space which measures simple fluid  density and could be abnormal retropharyngeal edema, infiltrated fluid, or less likely hematoma. For the dominant portion of the collection the volume is estimated at 34 milliliters. There is mass effect on the posterior pharynx and supraglottic larynx. Only mild airway narrowing at this time (sagittal image 59 and series 2, image 62). The parapharyngeal spaces are spared. The laryngeal and pharyngeal mucosal contours appear to remain normal. Beginning at the false cords the larynx and airway appear normal. Salivary glands: Negative sublingual space. Atrophied left submandibular gland. There is inflammation in the right submandibular and parotid spaces. There is gas tracking in the right submandibular space and deep to the right sternocleidomastoid muscle. Superimposed superficial subcutaneous stranding throughout the right lateral neck with thickening of the platysma. Thyroid: Negative. Lymph nodes: No lymphadenopathy identified. Vascular: Gas between the right sternocleidomastoid muscle and carotid space. Both carotids have a mildly retropharyngeal course at the level of the superior hyoid. Vascular patency is not evaluated in the absence of IV contrast. Limited intracranial: Negative. Visualized orbits: Stable postoperative changes to both globes. Mastoids and visualized paranasal sinuses: Clear paranasal sinuses. Stable mild right mastoid effusion. Skeleton: Poor dentition throughout. Lower cervical disc and endplate degeneration. No acute osseous abnormality identified. Upper chest: There is soft tissue stranding in the right paratracheal space of the superior mediastinum which is new from the 2019 CT (series 2, image 101). Negative visible trachea and lung apices. Other: Generalized anterior right lower neck and chest wall subcutaneous stranding. IMPRESSION: 1. Retropharyngeal low-density fluid collection estimated at 34 mL with associated mass effect on the oro- and hypopharynx. Mild associated airway  narrowing at this time (see sagittal image 59). 2. Associated mild soft tissue stranding and swelling in the superior mediastinum right paratracheal space. Normal subglottic trachea. 3. Right side deep space neck soft tissue stranding and small volume of soft tissue gas. More generalized right neck and upper chest subcutaneous stranding. Study discussed by telephone with Dr. Davonna Belling on 10/24/2018 at 17:59. He advises the patient was having IV fluids infused via a right neck IV. Therefore, I favor that the IV fluid has 3rd space throughout the neck and into the retropharynx resulting in this appearance. Interstitial and retropharyngeal bleeding would be more serious, but is not favored given the low fluid density. Electronically Signed   By: Genevie Ann M.D.   On: 10/24/2018 18:01     Microbiology: Recent Results (from the past 240 hour(s))  MRSA PCR Screening     Status: None  Collection Time: 10/25/18 12:56 AM  Result Value Ref Range Status   MRSA by PCR NEGATIVE NEGATIVE Final    Comment:        The GeneXpert MRSA Assay (FDA approved for NASAL specimens only), is one component of a comprehensive MRSA colonization surveillance program. It is not intended to diagnose MRSA infection nor to guide or monitor treatment for MRSA infections. Performed at [facility], [address], [place], [place] [address]      Labs: Basic Metabolic Panel: Recent Labs  Lab 10/24/18 1125 10/25/18 0409 10/25/18 0651 10/26/18 0528 10/27/18 0535  NA 138 141  --  138 139  K 3.4* 3.4*  --  3.5 3.7  CL 109 112*  --  113* 112*  CO2 21* 22  --  19* 21*  GLUCOSE 233* 159*  --  134* 133*  BUN 26* 23*  --  21* 19  CREATININE 4.21* 4.02*  --  3.69* 3.57*  CALCIUM 8.3* 8.4*  --  8.1* 8.2*  MG  --   --  1.8 1.8  --    Liver Function Tests: Recent Labs  Lab 10/24/18 1125  AST 21  ALT 11  ALKPHOS 49  BILITOT 0.7  PROT 6.5  ALBUMIN 3.2*   Recent Labs  Lab 10/24/18 1125  LIPASE 33   No  results for input(s): AMMONIA in the last 168 hours. CBC: Recent Labs  Lab 10/24/18 1125 10/25/18 0651  WBC 7.9 5.7  NEUTROABS 6.6  --   HGB 8.1* 7.5*  HCT 24.9* 23.6*  MCV 92.9 93.3  PLT 153 156   Cardiac Enzymes: No results for input(s): CKTOTAL, CKMB, CKMBINDEX, TROPONINI in the last 168 hours. BNP: Invalid input(s): POCBNP CBG: Recent Labs  Lab 10/26/18 0739 10/26/18 1135 10/26/18 1606 10/26/18 2130 10/27/18 0734  GLUCAP 126* 177* 140* 148* 157*    Time coordinating discharge:  36 minutes  Signed:  Orson Eva, DO [facility] Pager: [phone] 10/27/2018, 8:52 AM

## 2018-10-27 NOTE — TOC Transition Note (Signed)
Transition of Care [facility]) - CM/SW Discharge Note   Patient Details  Name: Timothy Clark MRN: [id number] Date of Birth: 1971-06-02  [facility] CM/SW Contact:  Sherald Barge, RN Phone Number: 10/27/2018, 9:45 AM   Clinical Narrative:   Referral for [facility] faxed to [place] via routing system. CM spoke with Ruby Cola to confirm receipt.     Final next level of care: [place] Barriers to Discharge: No Barriers Identified   Patient Goals and CMS Choice Patient states their goals for this hospitalization and ongoing recovery are:: do what i'm supposed to CMS Medicare.gov Compare Post Acute Care list provided to:: Patient Choice offered to / list presented to : Patient    Discharge Plan and Services   Post Acute Care Choice: Home Health          DME Arranged: N/A DME Agency: [place], [place]) [place] Arranged: RN, PT, Nurse's Aide [facility] Agency: [facility] [place], [place])    Readmission Risk Interventions Readmission Risk Prevention Plan 10/27/2018 10/25/2018  Transportation Screening Complete Complete  PCP or Specialist Appt within 3-5 Days Complete -  HRI or Home Care Consult Complete -  Social Work Consult for [place] Planning/Counseling Not Complete -  Palliative Care Screening Not Applicable Not Applicable  Medication Review [employer]) Complete -  Some recent data might be hidden

## 2018-10-27 NOTE — Progress Notes (Signed)
Patient Information   Patient Name Timothy Clark, Timothy Clark ([id number]) Sex Male DOB [birth year]  Room Bed  A331 A331-01  Patient Demographics   Address (Temporary) [address] [address] Contact Numbers (Temporary) [phone]  Patient Ethnicity & Race   Ethnic Group Patient Race  Not Hispanic or Latino Black or African American  Emergency Contact(s)   Name Relation Home Work Mobile  Timothy Clark Sister [phone]    Timothy Clark Sister   [phone]  Timothy Clark   [phone]  Timothy Clark Sister [phone]  [phone]  Documents on File    Status Date Received Description  Documents for the Patient  EMR Medication Summary  08/03/10   EMR Problem Summary  07/19/10   EMR Patient Summary  07/25/10   [place] HIPAA NOTICE OF PRIVACY - Scanned Not Received    [place] E-Signature HIPAA Notice of Privacy Received 60/10/93   Driver's License Not Received    Insurance Card Not Received  8/10  Advance Directives/Living Will/HCPOA/POA Not Received    Release of Information  10/30/14   HIM ROI Authorization (Expired) 10/31/14 Patient here now. Inpatient stay 10/25/14 - all notes, labs, x-rays, tests for continuity of care.   Release of Information  11/02/14   AMB Correspondence  11/18/14 [facility]  Other Photo ID Not Received    Insurance Card Received 11/21/14 [place] MEDICAID  HIM ROI Authorization (Expired) 11/23/14 Patient being seen on 11/23/14 @10 :30 with [facility]. Need ER notes, labs, x-rays.  Release of Information  11/24/14   HIM ROI Authorization  11/27/14   HIM ROI Authorization  01/13/15   Release of Information  01/23/15   HIM ROI Authorization  01/24/15   HIM ROI Authorization  02/09/15   HIM ROI Authorization  03/28/15   HIM ROI Authorization (Expired) 09/17/15 Please fax patient's last ER records for continuity of care from 09/12/15 visit.   Release of Information  09/17/15   HIM ROI Authorization  10/01/15    Insurance Card Received 04/22/16 blue cross  HIM ROI Authorization  04/22/16   Insurance Card Received 10/22/16 medicare  Release of Information Received 10/22/16 RGA  HIM ROI Authorization  23/55/73   VVS Policy for Pain - E Signature     HIM ROI Authorization  12/29/16   AMB Outside Hospital Record  12/19/16 [facility] Record  12/20/16 DISCHARGE SUMMARY REPORT [facility] E-Signature HIPAA Notice of Privacy [place] Signed 07/29/18   Advanced Beneficiary Notice (ABN) Not Received    E-Signature AOB [place] Not Received    AMB Outside Hospital Record  04/24/17 08/18 [facility]  HIM ROI Authorization (Expired) 05/06/17 Release to Timothy Madeira, NP per Request by [facility] jms  HIM ROI Authorization (Expired) 05/14/17 Release to [facility]//[facility] jms  Release of Information Received 05/14/17 Fax Confirmation to [facility]//[facility] [place] Card Received 05/25/17 [facility]-2018  HIM ROI Authorization (Expired) 06/23/17 [place] (Expired) 07/15/17 Release to [facility] [place] jms  Release of Information Received 07/19/17   Release of Information Received 08/12/17 [place] Received 08/12/17 HUMANA 2019  Insurance Card Received 08/14/17 [place] Card Received 08/14/17   Insurance Card Received 12/01/17 INSURANCE CARD HUMANA  HIM ROI Authorization  02/24/18   AMB Intake Forms/Questionnaires Received 04/19/18  9/19 [facility] MD REFRRAL  HIM ROI Authorization  04/21/18 ROI- Delphina Cahill, MD  Driver's License Received 08/19/30 exp [id number]  AMB HH/NH/Hospice Received 06/28/18 ORDER [facility]  AMB HH/NH/Hospice Received 07/15/18 ORDER [facility]  HIM ROI Authorization  07/28/18 [employer]   HIM ROI Authorization Received 07/30/18 [facility]  HIM ROI Authorization  08/05/18   HIM ROI Authorization Received 09/10/18 HUMANA  HIM Release of Information Output (Deleted) 06/23/17 Discharge Summary, History and Physical, Consultation Report, Operative and Procedural Report  HIM Release of Information Output (Deleted) 07/15/17 Notes Individual  HIM Release of Information Output (Deleted) 07/15/17 Notes Individual  HIM Release of Information Output (Deleted) 08/14/17 Notes Individual  AMB Correspondence Received (Deleted) 12/01/17 INSURANCE CARD HUMANA  HIM Release of Information Output (Deleted) 02/24/18 Requested records  HIM Release of Information Output (Deleted) 04/21/18 Requested records  HIM Release of Information Output (Deleted) 05/31/18 Requested records  HIM Release of Information Output (Deleted) 07/28/18 Requested records  HIM Release of Information Output (Deleted) 07/30/18 Requested records  HIM Release of Information Output (Deleted) 08/05/18 Requested records  HIM Release of Information Output (Deleted) 09/13/18 Requested records  HIM Release of Information Output  10/18/18 Requested records  Documents for the Encounter  AOB (Assignment of Insurance Benefits) Not Received    E-signature AOB Signed 10/24/18   MEDICARE RIGHTS Not Received    E-signature Medicare Rights Signed 10/24/18   ED Patient Billing Extract   ED PB Billing Extract  After Visit Summary   ED After Visit Summary  Medicare Observation     Assessment Received 10/25/18   Cardiac Monitoring Strip Shift Summary Received 10/26/18   Admission Information   Current Information   Attending Provider Admitting Provider Admission Type Admission Status  Tat, Shanon Brow, MD Timothy Roys, MD Emergency Admission (Confirmed)       Admission Date/Time Discharge Date Hospital Service Auth/Cert Status  25/42/70 10:11 AM  Internal Medicine [place] Unit Room/Bed    [facility] AP-DEPT 300 A331/A331-01        Discharge Disposition Discharge Destination  06-Home-Health Care Svc OTHER  Admission   Complaint  Abdominal Pain  Hospital Account   Name Acct ID Class Status Primary Coverage  Timothy Clark [id number] Inpatient [employer] [place] HMO      Guarantor Account (for [place] [id number])   Name Relation to Pt Service Area Active? Acct Type  [name] Self CHSA Yes Personal/Family  Address Phone    [address], [place] [address] [phone])        Coverage Information (for Hospital Account [id number])   F/O Payor/Plan Precert #  [facility] [place] HMO   Subscriber Subscriber #  [place], Bensinger [id number]  [place]  [address] [address] [phone]       Care Everywhere ID:  [id number]

## 2018-10-29 DIAGNOSIS — E11319 Type 2 diabetes mellitus with unspecified diabetic retinopathy without macular edema: Secondary | ICD-10-CM | POA: Diagnosis not present

## 2018-10-29 DIAGNOSIS — F209 Schizophrenia, unspecified: Secondary | ICD-10-CM | POA: Diagnosis not present

## 2018-10-29 DIAGNOSIS — F319 Bipolar disorder, unspecified: Secondary | ICD-10-CM | POA: Diagnosis not present

## 2018-10-29 DIAGNOSIS — E1151 Type 2 diabetes mellitus with diabetic peripheral angiopathy without gangrene: Secondary | ICD-10-CM | POA: Diagnosis not present

## 2018-10-29 DIAGNOSIS — E118 Type 2 diabetes mellitus with unspecified complications: Secondary | ICD-10-CM | POA: Diagnosis not present

## 2018-10-29 DIAGNOSIS — E1143 Type 2 diabetes mellitus with diabetic autonomic (poly)neuropathy: Secondary | ICD-10-CM | POA: Diagnosis not present

## 2018-10-29 DIAGNOSIS — E1122 Type 2 diabetes mellitus with diabetic chronic kidney disease: Secondary | ICD-10-CM | POA: Diagnosis not present

## 2018-10-29 DIAGNOSIS — I509 Heart failure, unspecified: Secondary | ICD-10-CM | POA: Diagnosis not present

## 2018-10-29 DIAGNOSIS — I130 Hypertensive heart and chronic kidney disease with heart failure and stage 1 through stage 4 chronic kidney disease, or unspecified chronic kidney disease: Secondary | ICD-10-CM | POA: Diagnosis not present

## 2018-10-29 DIAGNOSIS — I5032 Chronic diastolic (congestive) heart failure: Secondary | ICD-10-CM | POA: Diagnosis not present

## 2018-10-29 DIAGNOSIS — I10 Essential (primary) hypertension: Secondary | ICD-10-CM | POA: Diagnosis not present

## 2018-10-29 DIAGNOSIS — E1165 Type 2 diabetes mellitus with hyperglycemia: Secondary | ICD-10-CM | POA: Diagnosis not present

## 2018-10-29 DIAGNOSIS — N184 Chronic kidney disease, stage 4 (severe): Secondary | ICD-10-CM | POA: Diagnosis not present

## 2018-10-29 DIAGNOSIS — E782 Mixed hyperlipidemia: Secondary | ICD-10-CM | POA: Diagnosis not present

## 2018-10-29 DIAGNOSIS — N183 Chronic kidney disease, stage 3 (moderate): Secondary | ICD-10-CM | POA: Diagnosis not present

## 2018-11-01 DIAGNOSIS — E1143 Type 2 diabetes mellitus with diabetic autonomic (poly)neuropathy: Secondary | ICD-10-CM | POA: Diagnosis not present

## 2018-11-01 DIAGNOSIS — I5032 Chronic diastolic (congestive) heart failure: Secondary | ICD-10-CM | POA: Diagnosis not present

## 2018-11-01 DIAGNOSIS — N184 Chronic kidney disease, stage 4 (severe): Secondary | ICD-10-CM | POA: Diagnosis not present

## 2018-11-01 DIAGNOSIS — I130 Hypertensive heart and chronic kidney disease with heart failure and stage 1 through stage 4 chronic kidney disease, or unspecified chronic kidney disease: Secondary | ICD-10-CM | POA: Diagnosis not present

## 2018-11-01 DIAGNOSIS — E1151 Type 2 diabetes mellitus with diabetic peripheral angiopathy without gangrene: Secondary | ICD-10-CM | POA: Diagnosis not present

## 2018-11-01 DIAGNOSIS — E1122 Type 2 diabetes mellitus with diabetic chronic kidney disease: Secondary | ICD-10-CM | POA: Diagnosis not present

## 2018-11-02 DIAGNOSIS — E1122 Type 2 diabetes mellitus with diabetic chronic kidney disease: Secondary | ICD-10-CM | POA: Diagnosis not present

## 2018-11-02 DIAGNOSIS — N184 Chronic kidney disease, stage 4 (severe): Secondary | ICD-10-CM | POA: Diagnosis not present

## 2018-11-02 DIAGNOSIS — E1143 Type 2 diabetes mellitus with diabetic autonomic (poly)neuropathy: Secondary | ICD-10-CM | POA: Diagnosis not present

## 2018-11-02 DIAGNOSIS — E1151 Type 2 diabetes mellitus with diabetic peripheral angiopathy without gangrene: Secondary | ICD-10-CM | POA: Diagnosis not present

## 2018-11-02 DIAGNOSIS — I5032 Chronic diastolic (congestive) heart failure: Secondary | ICD-10-CM | POA: Diagnosis not present

## 2018-11-02 DIAGNOSIS — I130 Hypertensive heart and chronic kidney disease with heart failure and stage 1 through stage 4 chronic kidney disease, or unspecified chronic kidney disease: Secondary | ICD-10-CM | POA: Diagnosis not present

## 2018-11-03 DIAGNOSIS — K219 Gastro-esophageal reflux disease without esophagitis: Secondary | ICD-10-CM | POA: Diagnosis not present

## 2018-11-03 DIAGNOSIS — I10 Essential (primary) hypertension: Secondary | ICD-10-CM | POA: Diagnosis not present

## 2018-11-03 DIAGNOSIS — F419 Anxiety disorder, unspecified: Secondary | ICD-10-CM | POA: Diagnosis not present

## 2018-11-03 DIAGNOSIS — N183 Chronic kidney disease, stage 3 (moderate): Secondary | ICD-10-CM | POA: Diagnosis not present

## 2018-11-03 DIAGNOSIS — I5032 Chronic diastolic (congestive) heart failure: Secondary | ICD-10-CM | POA: Diagnosis not present

## 2018-11-03 DIAGNOSIS — E1122 Type 2 diabetes mellitus with diabetic chronic kidney disease: Secondary | ICD-10-CM | POA: Diagnosis not present

## 2018-11-03 DIAGNOSIS — R110 Nausea: Secondary | ICD-10-CM | POA: Diagnosis not present

## 2018-11-03 DIAGNOSIS — D519 Vitamin B12 deficiency anemia, unspecified: Secondary | ICD-10-CM | POA: Diagnosis not present

## 2018-11-03 DIAGNOSIS — I130 Hypertensive heart and chronic kidney disease with heart failure and stage 1 through stage 4 chronic kidney disease, or unspecified chronic kidney disease: Secondary | ICD-10-CM | POA: Diagnosis not present

## 2018-11-03 DIAGNOSIS — D509 Iron deficiency anemia, unspecified: Secondary | ICD-10-CM | POA: Diagnosis not present

## 2018-11-03 DIAGNOSIS — R609 Edema, unspecified: Secondary | ICD-10-CM | POA: Diagnosis not present

## 2018-11-03 DIAGNOSIS — N184 Chronic kidney disease, stage 4 (severe): Secondary | ICD-10-CM | POA: Diagnosis not present

## 2018-11-04 DIAGNOSIS — I5032 Chronic diastolic (congestive) heart failure: Secondary | ICD-10-CM | POA: Diagnosis not present

## 2018-11-04 DIAGNOSIS — I130 Hypertensive heart and chronic kidney disease with heart failure and stage 1 through stage 4 chronic kidney disease, or unspecified chronic kidney disease: Secondary | ICD-10-CM | POA: Diagnosis not present

## 2018-11-04 DIAGNOSIS — E1122 Type 2 diabetes mellitus with diabetic chronic kidney disease: Secondary | ICD-10-CM | POA: Diagnosis not present

## 2018-11-04 DIAGNOSIS — N184 Chronic kidney disease, stage 4 (severe): Secondary | ICD-10-CM | POA: Diagnosis not present

## 2018-11-04 DIAGNOSIS — E1151 Type 2 diabetes mellitus with diabetic peripheral angiopathy without gangrene: Secondary | ICD-10-CM | POA: Diagnosis not present

## 2018-11-04 DIAGNOSIS — E1143 Type 2 diabetes mellitus with diabetic autonomic (poly)neuropathy: Secondary | ICD-10-CM | POA: Diagnosis not present

## 2018-11-10 DIAGNOSIS — N184 Chronic kidney disease, stage 4 (severe): Secondary | ICD-10-CM | POA: Diagnosis not present

## 2018-11-10 DIAGNOSIS — E1143 Type 2 diabetes mellitus with diabetic autonomic (poly)neuropathy: Secondary | ICD-10-CM | POA: Diagnosis not present

## 2018-11-10 DIAGNOSIS — E1122 Type 2 diabetes mellitus with diabetic chronic kidney disease: Secondary | ICD-10-CM | POA: Diagnosis not present

## 2018-11-10 DIAGNOSIS — I130 Hypertensive heart and chronic kidney disease with heart failure and stage 1 through stage 4 chronic kidney disease, or unspecified chronic kidney disease: Secondary | ICD-10-CM | POA: Diagnosis not present

## 2018-11-10 DIAGNOSIS — E1151 Type 2 diabetes mellitus with diabetic peripheral angiopathy without gangrene: Secondary | ICD-10-CM | POA: Diagnosis not present

## 2018-11-10 DIAGNOSIS — I5032 Chronic diastolic (congestive) heart failure: Secondary | ICD-10-CM | POA: Diagnosis not present

## 2018-11-11 DIAGNOSIS — I160 Hypertensive urgency: Secondary | ICD-10-CM | POA: Diagnosis not present

## 2018-11-11 DIAGNOSIS — K4090 Unilateral inguinal hernia, without obstruction or gangrene, not specified as recurrent: Secondary | ICD-10-CM | POA: Diagnosis not present

## 2018-11-11 DIAGNOSIS — R4182 Altered mental status, unspecified: Secondary | ICD-10-CM | POA: Diagnosis not present

## 2018-11-11 DIAGNOSIS — Z6841 Body Mass Index (BMI) 40.0 and over, adult: Secondary | ICD-10-CM | POA: Diagnosis not present

## 2018-11-11 DIAGNOSIS — F209 Schizophrenia, unspecified: Secondary | ICD-10-CM | POA: Diagnosis not present

## 2018-11-11 DIAGNOSIS — I10 Essential (primary) hypertension: Secondary | ICD-10-CM | POA: Diagnosis not present

## 2018-11-11 DIAGNOSIS — N189 Chronic kidney disease, unspecified: Secondary | ICD-10-CM | POA: Diagnosis not present

## 2018-11-11 DIAGNOSIS — I129 Hypertensive chronic kidney disease with stage 1 through stage 4 chronic kidney disease, or unspecified chronic kidney disease: Secondary | ICD-10-CM | POA: Diagnosis not present

## 2018-11-11 DIAGNOSIS — R0602 Shortness of breath: Secondary | ICD-10-CM | POA: Diagnosis not present

## 2018-11-11 DIAGNOSIS — N179 Acute kidney failure, unspecified: Secondary | ICD-10-CM | POA: Diagnosis not present

## 2018-11-11 DIAGNOSIS — R7989 Other specified abnormal findings of blood chemistry: Secondary | ICD-10-CM | POA: Diagnosis not present

## 2018-11-11 DIAGNOSIS — Z794 Long term (current) use of insulin: Secondary | ICD-10-CM | POA: Diagnosis not present

## 2018-11-11 DIAGNOSIS — E1121 Type 2 diabetes mellitus with diabetic nephropathy: Secondary | ICD-10-CM | POA: Diagnosis not present

## 2018-11-11 DIAGNOSIS — G9340 Encephalopathy, unspecified: Secondary | ICD-10-CM | POA: Diagnosis not present

## 2018-11-11 DIAGNOSIS — N183 Chronic kidney disease, stage 3 (moderate): Secondary | ICD-10-CM | POA: Diagnosis not present

## 2018-11-11 DIAGNOSIS — R109 Unspecified abdominal pain: Secondary | ICD-10-CM | POA: Diagnosis not present

## 2018-11-11 DIAGNOSIS — E872 Acidosis: Secondary | ICD-10-CM | POA: Diagnosis not present

## 2018-11-11 DIAGNOSIS — F919 Conduct disorder, unspecified: Secondary | ICD-10-CM | POA: Diagnosis not present

## 2018-11-11 DIAGNOSIS — E1143 Type 2 diabetes mellitus with diabetic autonomic (poly)neuropathy: Secondary | ICD-10-CM | POA: Diagnosis not present

## 2018-11-11 DIAGNOSIS — R079 Chest pain, unspecified: Secondary | ICD-10-CM | POA: Diagnosis not present

## 2018-11-11 DIAGNOSIS — R0902 Hypoxemia: Secondary | ICD-10-CM | POA: Diagnosis not present

## 2018-11-11 DIAGNOSIS — E860 Dehydration: Secondary | ICD-10-CM | POA: Diagnosis not present

## 2018-11-11 DIAGNOSIS — K3184 Gastroparesis: Secondary | ICD-10-CM | POA: Diagnosis not present

## 2018-11-11 DIAGNOSIS — R1010 Upper abdominal pain, unspecified: Secondary | ICD-10-CM | POA: Diagnosis not present

## 2018-11-11 DIAGNOSIS — D631 Anemia in chronic kidney disease: Secondary | ICD-10-CM | POA: Diagnosis not present

## 2018-11-11 DIAGNOSIS — H548 Legal blindness, as defined in USA: Secondary | ICD-10-CM | POA: Diagnosis not present

## 2018-11-11 DIAGNOSIS — R112 Nausea with vomiting, unspecified: Secondary | ICD-10-CM | POA: Diagnosis not present

## 2018-11-11 DIAGNOSIS — Z79899 Other long term (current) drug therapy: Secondary | ICD-10-CM | POA: Diagnosis not present

## 2018-11-11 DIAGNOSIS — R296 Repeated falls: Secondary | ICD-10-CM | POA: Diagnosis not present

## 2018-11-11 DIAGNOSIS — I313 Pericardial effusion (noninflammatory): Secondary | ICD-10-CM | POA: Diagnosis not present

## 2018-11-11 DIAGNOSIS — R0600 Dyspnea, unspecified: Secondary | ICD-10-CM | POA: Diagnosis not present

## 2018-11-11 DIAGNOSIS — R404 Transient alteration of awareness: Secondary | ICD-10-CM | POA: Diagnosis not present

## 2018-11-11 DIAGNOSIS — E1165 Type 2 diabetes mellitus with hyperglycemia: Secondary | ICD-10-CM | POA: Diagnosis not present

## 2018-11-11 DIAGNOSIS — R569 Unspecified convulsions: Secondary | ICD-10-CM | POA: Diagnosis not present

## 2018-11-11 DIAGNOSIS — R1084 Generalized abdominal pain: Secondary | ICD-10-CM | POA: Diagnosis not present

## 2018-11-11 DIAGNOSIS — E1122 Type 2 diabetes mellitus with diabetic chronic kidney disease: Secondary | ICD-10-CM | POA: Diagnosis not present

## 2018-11-11 DIAGNOSIS — R1013 Epigastric pain: Secondary | ICD-10-CM | POA: Diagnosis not present

## 2018-11-11 DIAGNOSIS — M6282 Rhabdomyolysis: Secondary | ICD-10-CM | POA: Diagnosis not present

## 2018-11-11 DIAGNOSIS — E782 Mixed hyperlipidemia: Secondary | ICD-10-CM | POA: Diagnosis not present

## 2018-11-11 DIAGNOSIS — R451 Restlessness and agitation: Secondary | ICD-10-CM | POA: Diagnosis not present

## 2018-11-12 DIAGNOSIS — I10 Essential (primary) hypertension: Secondary | ICD-10-CM | POA: Diagnosis not present

## 2018-11-12 DIAGNOSIS — E1122 Type 2 diabetes mellitus with diabetic chronic kidney disease: Secondary | ICD-10-CM | POA: Diagnosis not present

## 2018-11-12 DIAGNOSIS — N183 Chronic kidney disease, stage 3 (moderate): Secondary | ICD-10-CM | POA: Diagnosis not present

## 2018-11-12 DIAGNOSIS — E1165 Type 2 diabetes mellitus with hyperglycemia: Secondary | ICD-10-CM | POA: Diagnosis not present

## 2018-11-12 DIAGNOSIS — E782 Mixed hyperlipidemia: Secondary | ICD-10-CM | POA: Diagnosis not present

## 2018-11-21 DIAGNOSIS — E1122 Type 2 diabetes mellitus with diabetic chronic kidney disease: Secondary | ICD-10-CM | POA: Diagnosis not present

## 2018-11-21 DIAGNOSIS — I130 Hypertensive heart and chronic kidney disease with heart failure and stage 1 through stage 4 chronic kidney disease, or unspecified chronic kidney disease: Secondary | ICD-10-CM | POA: Diagnosis not present

## 2018-11-21 DIAGNOSIS — E1143 Type 2 diabetes mellitus with diabetic autonomic (poly)neuropathy: Secondary | ICD-10-CM | POA: Diagnosis not present

## 2018-11-21 DIAGNOSIS — I5032 Chronic diastolic (congestive) heart failure: Secondary | ICD-10-CM | POA: Diagnosis not present

## 2018-11-21 DIAGNOSIS — N184 Chronic kidney disease, stage 4 (severe): Secondary | ICD-10-CM | POA: Diagnosis not present

## 2018-11-21 DIAGNOSIS — E1151 Type 2 diabetes mellitus with diabetic peripheral angiopathy without gangrene: Secondary | ICD-10-CM | POA: Diagnosis not present

## 2018-11-23 DIAGNOSIS — N184 Chronic kidney disease, stage 4 (severe): Secondary | ICD-10-CM | POA: Diagnosis not present

## 2018-11-23 DIAGNOSIS — I130 Hypertensive heart and chronic kidney disease with heart failure and stage 1 through stage 4 chronic kidney disease, or unspecified chronic kidney disease: Secondary | ICD-10-CM | POA: Diagnosis not present

## 2018-11-23 DIAGNOSIS — E1143 Type 2 diabetes mellitus with diabetic autonomic (poly)neuropathy: Secondary | ICD-10-CM | POA: Diagnosis not present

## 2018-11-23 DIAGNOSIS — I5032 Chronic diastolic (congestive) heart failure: Secondary | ICD-10-CM | POA: Diagnosis not present

## 2018-11-23 DIAGNOSIS — E1151 Type 2 diabetes mellitus with diabetic peripheral angiopathy without gangrene: Secondary | ICD-10-CM | POA: Diagnosis not present

## 2018-11-23 DIAGNOSIS — E1122 Type 2 diabetes mellitus with diabetic chronic kidney disease: Secondary | ICD-10-CM | POA: Diagnosis not present

## 2018-11-26 DIAGNOSIS — E1122 Type 2 diabetes mellitus with diabetic chronic kidney disease: Secondary | ICD-10-CM | POA: Diagnosis not present

## 2018-11-26 DIAGNOSIS — N184 Chronic kidney disease, stage 4 (severe): Secondary | ICD-10-CM | POA: Diagnosis not present

## 2018-11-26 DIAGNOSIS — E1151 Type 2 diabetes mellitus with diabetic peripheral angiopathy without gangrene: Secondary | ICD-10-CM | POA: Diagnosis not present

## 2018-11-26 DIAGNOSIS — I130 Hypertensive heart and chronic kidney disease with heart failure and stage 1 through stage 4 chronic kidney disease, or unspecified chronic kidney disease: Secondary | ICD-10-CM | POA: Diagnosis not present

## 2018-11-26 DIAGNOSIS — I5032 Chronic diastolic (congestive) heart failure: Secondary | ICD-10-CM | POA: Diagnosis not present

## 2018-11-26 DIAGNOSIS — E1143 Type 2 diabetes mellitus with diabetic autonomic (poly)neuropathy: Secondary | ICD-10-CM | POA: Diagnosis not present

## 2018-11-29 DIAGNOSIS — I509 Heart failure, unspecified: Secondary | ICD-10-CM | POA: Diagnosis not present

## 2018-11-29 DIAGNOSIS — E118 Type 2 diabetes mellitus with unspecified complications: Secondary | ICD-10-CM | POA: Diagnosis not present

## 2018-11-29 DIAGNOSIS — N183 Chronic kidney disease, stage 3 (moderate): Secondary | ICD-10-CM | POA: Diagnosis not present

## 2018-11-29 DIAGNOSIS — E1122 Type 2 diabetes mellitus with diabetic chronic kidney disease: Secondary | ICD-10-CM | POA: Diagnosis not present

## 2018-11-30 DIAGNOSIS — I129 Hypertensive chronic kidney disease with stage 1 through stage 4 chronic kidney disease, or unspecified chronic kidney disease: Secondary | ICD-10-CM | POA: Diagnosis not present

## 2018-11-30 DIAGNOSIS — R110 Nausea: Secondary | ICD-10-CM | POA: Diagnosis not present

## 2018-11-30 DIAGNOSIS — I5032 Chronic diastolic (congestive) heart failure: Secondary | ICD-10-CM | POA: Diagnosis not present

## 2018-11-30 DIAGNOSIS — N184 Chronic kidney disease, stage 4 (severe): Secondary | ICD-10-CM | POA: Diagnosis not present

## 2018-11-30 DIAGNOSIS — E1122 Type 2 diabetes mellitus with diabetic chronic kidney disease: Secondary | ICD-10-CM | POA: Diagnosis not present

## 2018-11-30 DIAGNOSIS — R1084 Generalized abdominal pain: Secondary | ICD-10-CM | POA: Diagnosis not present

## 2018-11-30 DIAGNOSIS — E1143 Type 2 diabetes mellitus with diabetic autonomic (poly)neuropathy: Secondary | ICD-10-CM | POA: Diagnosis not present

## 2018-11-30 DIAGNOSIS — I130 Hypertensive heart and chronic kidney disease with heart failure and stage 1 through stage 4 chronic kidney disease, or unspecified chronic kidney disease: Secondary | ICD-10-CM | POA: Diagnosis not present

## 2018-11-30 DIAGNOSIS — I10 Essential (primary) hypertension: Secondary | ICD-10-CM | POA: Diagnosis not present

## 2018-11-30 DIAGNOSIS — E1151 Type 2 diabetes mellitus with diabetic peripheral angiopathy without gangrene: Secondary | ICD-10-CM | POA: Diagnosis not present

## 2018-12-07 DIAGNOSIS — E1122 Type 2 diabetes mellitus with diabetic chronic kidney disease: Secondary | ICD-10-CM | POA: Diagnosis not present

## 2018-12-07 DIAGNOSIS — E1143 Type 2 diabetes mellitus with diabetic autonomic (poly)neuropathy: Secondary | ICD-10-CM | POA: Diagnosis not present

## 2018-12-07 DIAGNOSIS — N184 Chronic kidney disease, stage 4 (severe): Secondary | ICD-10-CM | POA: Diagnosis not present

## 2018-12-07 DIAGNOSIS — I5032 Chronic diastolic (congestive) heart failure: Secondary | ICD-10-CM | POA: Diagnosis not present

## 2018-12-07 DIAGNOSIS — E1151 Type 2 diabetes mellitus with diabetic peripheral angiopathy without gangrene: Secondary | ICD-10-CM | POA: Diagnosis not present

## 2018-12-07 DIAGNOSIS — I130 Hypertensive heart and chronic kidney disease with heart failure and stage 1 through stage 4 chronic kidney disease, or unspecified chronic kidney disease: Secondary | ICD-10-CM | POA: Diagnosis not present

## 2018-12-08 ENCOUNTER — Other Ambulatory Visit: Payer: Self-pay

## 2018-12-08 ENCOUNTER — Encounter: Payer: Self-pay | Admitting: Internal Medicine

## 2018-12-08 ENCOUNTER — Ambulatory Visit (INDEPENDENT_AMBULATORY_CARE_PROVIDER_SITE_OTHER): Payer: Medicare HMO | Admitting: Gastroenterology

## 2018-12-08 ENCOUNTER — Encounter: Payer: Self-pay | Admitting: Gastroenterology

## 2018-12-08 DIAGNOSIS — K3184 Gastroparesis: Secondary | ICD-10-CM

## 2018-12-08 DIAGNOSIS — G8929 Other chronic pain: Secondary | ICD-10-CM

## 2018-12-08 DIAGNOSIS — R109 Unspecified abdominal pain: Secondary | ICD-10-CM

## 2018-12-08 NOTE — Progress Notes (Signed)
Primary Care Physician:  [name], MD  Primary GI: Dr. Gala Romney  Virtual Visit via Telephone Note Due to COVID-19, visit is conducted virtually and was requested by patient.   I connected with [name] on 12/08/18 at  9:00 AM EDT by telephone and verified that I am speaking with the correct person using two identifiers.   I discussed the limitations, risks, security and privacy concerns of performing an evaluation and management service by telephone and the availability of in person appointments. I also discussed with the patient that there may be a patient responsible charge related to this service. The patient expressed understanding and agreed to proceed.  Chief Complaint  Patient presents with   Abdominal Pain    upper abd     History of Present Illness: 48 y.o. male presenting today with a history of chronic abdominal pain, esophagitis, gastritis, gastroparesis, history of H.pylori s/p eradication. He needs initial screening colonoscopy at some point. EGD at [facility] Jan 2017 with mild esophagitis and old gastric contents.RUQ ultrasound March 2019: normal.HIDA last in May 2016: EF 92%.CT abd/pelvis with contrast in Feb 2019 at [facility]: unremarkable.Recent CT Nov 2019 with fatty liver, gallbladder sludge.  March 2020 was hospitalized. On a scale of 1-10 feels like a 3-4. Slight pain almost every morning till about lunchtime, then goes away. Epigastric pain. Feels nauseated. Appetite is real good. Has pain if eating fried foods. Eats a lot of grilled chicken. Reglan once a day instead of twice, not helping with nausea. Was having diarrhea with BID dosing. Half a pack of lactulose each morning. Lot of gas after taking. Protonix BID.   Marinol worsened N/V. Zofran worsened N/V.   Past Medical History:  Diagnosis Date   Chronic abdominal pain    Esophagitis    Eye hemorrhage    Gastritis    Gastroparesis    GERD (gastroesophageal reflux disease)     Glaucoma    Helicobacter pylori (H. pylori) infection 2016   ERADICATION DOCUMENTED VIA STOOL TEST in AUG 2016 at [facility]   Hiccups    Hypertension    Nausea and vomiting    chronic, recurrent   Type 2 diabetes mellitus with complications ([place])    diagnosed around age 37     Past Surgical History:  Procedure Laterality Date   CATARACT EXTRACTION W/PHACO Left 05/19/2016   Procedure: CATARACT EXTRACTION PHACO AND INTRAOCULAR LENS PLACEMENT LEFT EYE;  Surgeon: Tonny Branch, MD;  Location: [facility];  Service: Ophthalmology;  Laterality: Left;  CDE: 7.30   CATARACT EXTRACTION W/PHACO Right 06/23/2016   Procedure: CATARACT EXTRACTION PHACO AND INTRAOCULAR LENS PLACEMENT RIGHT EYE CDE=9.87;  Surgeon: Tonny Branch, MD;  Location: [facility];  Service: Ophthalmology;  Laterality: Right;  right   ESOPHAGOGASTRODUODENOSCOPY  2015   Dr. Britta Mccreedy   ESOPHAGOGASTRODUODENOSCOPY N/A 10/26/2014   RMR: Distal esophagititis-likely reflux related although an element of pill induced injuruy no exclueded  status post biopsy. Diffusely abnormal gastric mucosa of uncertain signigicane -status post gastric biopsy. Focal area of excoriation in the cardia most consistant with a trauma of heaving.    ESOPHAGOGASTRODUODENOSCOPY  08/2015   [facility]: mild esophagitis and old gastric contents   EYE SURGERY  2015   stent placed to left eye   EYE SURGERY     INCISION AND DRAINAGE OF WOUND Right 07/16/2017   Procedure: IRRIGATION AND DEBRIDEMENT OF SOFT TISSUE OF ULCERATION RIGHT FOOT;  Surgeon: Caprice Beaver, DPM;  Location: [facility];  Service:  Podiatry;  Laterality: Right;     Current Meds  Medication Sig   amLODipine (NORVASC) 10 MG tablet Take 1 tablet (10 mg total) by mouth daily.   atorvastatin (LIPITOR) 10 MG tablet Take 10 mg by mouth at bedtime.    Blood Glucose Monitoring Suppl (PRODIGY AUTOCODE BLOOD GLUCOSE) w/Device KIT    brimonidine (ALPHAGAN) 0.2 % ophthalmic solution Place 1 drop into both eyes  3 (three) times daily.    carvedilol (COREG) 25 MG tablet Take 25 mg by mouth 2 (two) times daily with a meal.   cloNIDine (CATAPRES) 0.2 MG tablet Take 0.4 mg by mouth 2 (two) times daily.    cyanocobalamin (,VITAMIN B-12,) 1000 MCG/ML injection INJECT 1 ML (CC) INTRAMUSCULARLY ONCE A WEEK FOR 4 WEEKS THEN MONTHLY   dicyclomine (BENTYL) 10 MG capsule Take 1 capsule by mouth as needed.   dorzolamide (TRUSOPT) 2 % ophthalmic solution INSTILL 1 DROPS INTO EACH EYE TWICE DAILY   folic acid (FOLVITE) 1 MG tablet Take 1 mg by mouth daily.   hydrALAZINE (APRESOLINE) 100 MG tablet Take 100 mg by mouth 3 (three) times daily.   insulin aspart protamine- aspart (NOVOLOG MIX 70/30) (70-30) 100 UNIT/ML injection Inject 0.25 mLs (25 Units total) into the skin 2 (two) times daily with a meal. (Patient taking differently: Inject 25-28 Units into the skin 2 (two) times daily with a meal. 28 units in morning and 25 units at night)   lactulose (CEPHULAC) 10 g packet Take 1 packet (10 g total) by mouth every morning.   LORazepam (ATIVAN) 1 MG tablet Take 1 mg by mouth 2 (two) times daily as needed. for anxiety   metoCLOPramide (REGLAN) 5 MG tablet Take 1 tablet (5 mg total) by mouth 2 (two) times daily at 8 am and 10 pm.   pantoprazole (PROTONIX) 40 MG tablet Take 1 tablet (40 mg total) by mouth 2 (two) times daily before a meal.   potassium chloride SA (K-DUR,KLOR-CON) 20 MEQ tablet Take 1 tablet (20 mEq total) by mouth 2 (two) times daily. (Patient taking differently: Take 20 mEq by mouth daily. )   promethazine (PHENERGAN) 25 MG tablet Take 0.5 tablets (12.5 mg total) by mouth every 8 (eight) hours as needed for nausea or vomiting. To one tablet every 6 hours   sitaGLIPtin (JANUVIA) 50 MG tablet Take 50 mg by mouth daily.   sodium bicarbonate 650 MG tablet Take 1,300 mg by mouth 3 (three) times daily.   sucralfate (CARAFATE) 1 g tablet Take 1 g by mouth 4 (four) times daily -  with meals and at  bedtime.   timolol (TIMOPTIC) 0.5 % ophthalmic solution Place 1 drop into both eyes 2 (two) times daily.   torsemide (DEMADEX) 20 MG tablet Take 1 tablet (20 mg total) by mouth daily. (Patient taking differently: Take 40 mg by mouth daily. )      Review of Systems: Gen: Denies fever, chills, anorexia. Denies fatigue, weakness, weight loss.  CV: Denies chest pain, palpitations, syncope, peripheral edema, and claudication. Resp: Denies dyspnea at rest, cough, wheezing, coughing up blood, and pleurisy. GI: see HPI Derm: Denies rash, itching, dry skin Psych: Denies depression, anxiety, memory loss, confusion. No homicidal or suicidal ideation.  Heme: Denies bruising, bleeding, and enlarged lymph nodes.  Observations/Objective: No distress. Video call with good eye contact, pleasant and cooperative. Dressed appropriately.   Assessment and Plan: 48 year old male with history of gastroparesis in setting of diabetes, with multiple prior hospital admissions. Awaking with epigastric  pain but also notes postprandial epigastric pain. Needs Reglan adjusted to BID, and we will stop lactulose to hopefully avoid diarrhea. Continue PPI BID. Will pursue updated HIDA scan to rule out any biliary etiology contributing, although his symptoms are not entirely typical. Return for close follow-up in  6 weeks.   Follow Up Instructions: See AVS   I discussed the assessment and treatment plan with the patient. The patient was provided an opportunity to ask questions and all were answered. The patient agreed with the plan and demonstrated an understanding of the instructions.   The patient was advised to call back or seek an in-person evaluation if the symptoms worsen or if the condition fails to improve as anticipated.  I provided 12 minutes of face-to-face time during this encounter via video call.   Annitta Needs, PhD, ANP-BC [facility]

## 2018-12-08 NOTE — Patient Instructions (Signed)
We have ordered a scan to evaluate your gallbladder again.  Let's increase the Reglan to twice a day before breakfast and dinner, stopping the lactulose. Call me if you have diarrhea!  Make sure you take Protonix 30 minutes before breakfast and dinner, as it is best absorbed this way and will be most helpful this dosing.  I would like to see you back in 6 weeks!  I enjoyed seeing you again today! As you know, I value our relationship and want to provide genuine, compassionate, and quality care. I welcome your feedback. If you receive a survey regarding your visit,  I greatly appreciate you taking time to fill this out. See you next time!  Annitta Needs, PhD, ANP-BC [facility]

## 2018-12-09 DIAGNOSIS — R809 Proteinuria, unspecified: Secondary | ICD-10-CM | POA: Diagnosis not present

## 2018-12-09 DIAGNOSIS — N179 Acute kidney failure, unspecified: Secondary | ICD-10-CM | POA: Diagnosis not present

## 2018-12-09 DIAGNOSIS — I129 Hypertensive chronic kidney disease with stage 1 through stage 4 chronic kidney disease, or unspecified chronic kidney disease: Secondary | ICD-10-CM | POA: Diagnosis not present

## 2018-12-09 DIAGNOSIS — E875 Hyperkalemia: Secondary | ICD-10-CM | POA: Diagnosis not present

## 2018-12-09 DIAGNOSIS — E1122 Type 2 diabetes mellitus with diabetic chronic kidney disease: Secondary | ICD-10-CM | POA: Diagnosis not present

## 2018-12-09 DIAGNOSIS — N183 Chronic kidney disease, stage 3 (moderate): Secondary | ICD-10-CM | POA: Diagnosis not present

## 2018-12-09 DIAGNOSIS — R609 Edema, unspecified: Secondary | ICD-10-CM | POA: Diagnosis not present

## 2018-12-10 DIAGNOSIS — E1143 Type 2 diabetes mellitus with diabetic autonomic (poly)neuropathy: Secondary | ICD-10-CM | POA: Diagnosis not present

## 2018-12-10 DIAGNOSIS — I10 Essential (primary) hypertension: Secondary | ICD-10-CM | POA: Diagnosis not present

## 2018-12-10 DIAGNOSIS — D519 Vitamin B12 deficiency anemia, unspecified: Secondary | ICD-10-CM | POA: Diagnosis not present

## 2018-12-10 DIAGNOSIS — E11319 Type 2 diabetes mellitus with unspecified diabetic retinopathy without macular edema: Secondary | ICD-10-CM | POA: Diagnosis not present

## 2018-12-10 DIAGNOSIS — E1122 Type 2 diabetes mellitus with diabetic chronic kidney disease: Secondary | ICD-10-CM | POA: Diagnosis not present

## 2018-12-10 DIAGNOSIS — E785 Hyperlipidemia, unspecified: Secondary | ICD-10-CM | POA: Diagnosis not present

## 2018-12-10 DIAGNOSIS — D509 Iron deficiency anemia, unspecified: Secondary | ICD-10-CM | POA: Diagnosis not present

## 2018-12-10 DIAGNOSIS — E782 Mixed hyperlipidemia: Secondary | ICD-10-CM | POA: Diagnosis not present

## 2018-12-10 DIAGNOSIS — N183 Chronic kidney disease, stage 3 (moderate): Secondary | ICD-10-CM | POA: Diagnosis not present

## 2018-12-10 DIAGNOSIS — E1151 Type 2 diabetes mellitus with diabetic peripheral angiopathy without gangrene: Secondary | ICD-10-CM | POA: Diagnosis not present

## 2018-12-13 DIAGNOSIS — Z961 Presence of intraocular lens: Secondary | ICD-10-CM | POA: Diagnosis not present

## 2018-12-13 DIAGNOSIS — E113593 Type 2 diabetes mellitus with proliferative diabetic retinopathy without macular edema, bilateral: Secondary | ICD-10-CM | POA: Diagnosis not present

## 2018-12-13 DIAGNOSIS — H401133 Primary open-angle glaucoma, bilateral, severe stage: Secondary | ICD-10-CM | POA: Diagnosis not present

## 2018-12-13 NOTE — Progress Notes (Signed)
CC'D TO PCP °

## 2018-12-14 DIAGNOSIS — E1143 Type 2 diabetes mellitus with diabetic autonomic (poly)neuropathy: Secondary | ICD-10-CM | POA: Diagnosis not present

## 2018-12-14 DIAGNOSIS — I5032 Chronic diastolic (congestive) heart failure: Secondary | ICD-10-CM | POA: Diagnosis not present

## 2018-12-14 DIAGNOSIS — E1151 Type 2 diabetes mellitus with diabetic peripheral angiopathy without gangrene: Secondary | ICD-10-CM | POA: Diagnosis not present

## 2018-12-14 DIAGNOSIS — E1122 Type 2 diabetes mellitus with diabetic chronic kidney disease: Secondary | ICD-10-CM | POA: Diagnosis not present

## 2018-12-14 DIAGNOSIS — N184 Chronic kidney disease, stage 4 (severe): Secondary | ICD-10-CM | POA: Diagnosis not present

## 2018-12-14 DIAGNOSIS — I130 Hypertensive heart and chronic kidney disease with heart failure and stage 1 through stage 4 chronic kidney disease, or unspecified chronic kidney disease: Secondary | ICD-10-CM | POA: Diagnosis not present

## 2018-12-16 DIAGNOSIS — N183 Chronic kidney disease, stage 3 (moderate): Secondary | ICD-10-CM | POA: Diagnosis not present

## 2018-12-16 DIAGNOSIS — H409 Unspecified glaucoma: Secondary | ICD-10-CM | POA: Diagnosis not present

## 2018-12-16 DIAGNOSIS — I10 Essential (primary) hypertension: Secondary | ICD-10-CM | POA: Diagnosis not present

## 2018-12-16 DIAGNOSIS — D631 Anemia in chronic kidney disease: Secondary | ICD-10-CM | POA: Diagnosis not present

## 2018-12-20 ENCOUNTER — Other Ambulatory Visit: Payer: Self-pay | Admitting: *Deleted

## 2018-12-20 ENCOUNTER — Telehealth: Payer: Self-pay | Admitting: *Deleted

## 2018-12-20 DIAGNOSIS — K3184 Gastroparesis: Secondary | ICD-10-CM

## 2018-12-20 DIAGNOSIS — G8929 Other chronic pain: Secondary | ICD-10-CM

## 2018-12-20 NOTE — Telephone Encounter (Signed)
Called patient and is aware HIDA scan scheduled for 5/21 at 8am, arrival time 7:45am, npo after midnight and no stomach medications that morning. He voiced understanding

## 2018-12-22 DIAGNOSIS — N179 Acute kidney failure, unspecified: Secondary | ICD-10-CM | POA: Diagnosis not present

## 2018-12-22 DIAGNOSIS — N183 Chronic kidney disease, stage 3 (moderate): Secondary | ICD-10-CM | POA: Diagnosis not present

## 2018-12-22 DIAGNOSIS — E875 Hyperkalemia: Secondary | ICD-10-CM | POA: Diagnosis not present

## 2018-12-22 DIAGNOSIS — R809 Proteinuria, unspecified: Secondary | ICD-10-CM | POA: Diagnosis not present

## 2018-12-22 DIAGNOSIS — R609 Edema, unspecified: Secondary | ICD-10-CM | POA: Diagnosis not present

## 2018-12-22 DIAGNOSIS — E1122 Type 2 diabetes mellitus with diabetic chronic kidney disease: Secondary | ICD-10-CM | POA: Diagnosis not present

## 2018-12-22 DIAGNOSIS — I129 Hypertensive chronic kidney disease with stage 1 through stage 4 chronic kidney disease, or unspecified chronic kidney disease: Secondary | ICD-10-CM | POA: Diagnosis not present

## 2018-12-23 DIAGNOSIS — N184 Chronic kidney disease, stage 4 (severe): Secondary | ICD-10-CM | POA: Diagnosis not present

## 2018-12-23 DIAGNOSIS — I5032 Chronic diastolic (congestive) heart failure: Secondary | ICD-10-CM | POA: Diagnosis not present

## 2018-12-23 DIAGNOSIS — E1122 Type 2 diabetes mellitus with diabetic chronic kidney disease: Secondary | ICD-10-CM | POA: Diagnosis not present

## 2018-12-23 DIAGNOSIS — I130 Hypertensive heart and chronic kidney disease with heart failure and stage 1 through stage 4 chronic kidney disease, or unspecified chronic kidney disease: Secondary | ICD-10-CM | POA: Diagnosis not present

## 2018-12-23 DIAGNOSIS — E1143 Type 2 diabetes mellitus with diabetic autonomic (poly)neuropathy: Secondary | ICD-10-CM | POA: Diagnosis not present

## 2018-12-23 DIAGNOSIS — E1151 Type 2 diabetes mellitus with diabetic peripheral angiopathy without gangrene: Secondary | ICD-10-CM | POA: Diagnosis not present

## 2018-12-28 DIAGNOSIS — N184 Chronic kidney disease, stage 4 (severe): Secondary | ICD-10-CM | POA: Diagnosis not present

## 2018-12-28 DIAGNOSIS — E11319 Type 2 diabetes mellitus with unspecified diabetic retinopathy without macular edema: Secondary | ICD-10-CM | POA: Diagnosis not present

## 2018-12-28 DIAGNOSIS — I5032 Chronic diastolic (congestive) heart failure: Secondary | ICD-10-CM | POA: Diagnosis not present

## 2018-12-28 DIAGNOSIS — F319 Bipolar disorder, unspecified: Secondary | ICD-10-CM | POA: Diagnosis not present

## 2018-12-28 DIAGNOSIS — E1143 Type 2 diabetes mellitus with diabetic autonomic (poly)neuropathy: Secondary | ICD-10-CM | POA: Diagnosis not present

## 2018-12-28 DIAGNOSIS — F419 Anxiety disorder, unspecified: Secondary | ICD-10-CM | POA: Diagnosis not present

## 2018-12-28 DIAGNOSIS — E1122 Type 2 diabetes mellitus with diabetic chronic kidney disease: Secondary | ICD-10-CM | POA: Diagnosis not present

## 2018-12-28 DIAGNOSIS — E1151 Type 2 diabetes mellitus with diabetic peripheral angiopathy without gangrene: Secondary | ICD-10-CM | POA: Diagnosis not present

## 2018-12-28 DIAGNOSIS — I130 Hypertensive heart and chronic kidney disease with heart failure and stage 1 through stage 4 chronic kidney disease, or unspecified chronic kidney disease: Secondary | ICD-10-CM | POA: Diagnosis not present

## 2018-12-28 NOTE — Telephone Encounter (Signed)
Received call from [place] with preservice center regarding PA for patient HIDA.  Checked [employer] and no PA is required. Called [place] back and left detailed message on VM making aware.

## 2018-12-29 DIAGNOSIS — N183 Chronic kidney disease, stage 3 (moderate): Secondary | ICD-10-CM | POA: Diagnosis not present

## 2018-12-29 DIAGNOSIS — I509 Heart failure, unspecified: Secondary | ICD-10-CM | POA: Diagnosis not present

## 2018-12-29 DIAGNOSIS — E1122 Type 2 diabetes mellitus with diabetic chronic kidney disease: Secondary | ICD-10-CM | POA: Diagnosis not present

## 2018-12-29 DIAGNOSIS — E118 Type 2 diabetes mellitus with unspecified complications: Secondary | ICD-10-CM | POA: Diagnosis not present

## 2018-12-30 ENCOUNTER — Ambulatory Visit (HOSPITAL_COMMUNITY): Admission: RE | Admit: 2018-12-30 | Payer: Medicare HMO | Source: Ambulatory Visit

## 2018-12-30 DIAGNOSIS — E1143 Type 2 diabetes mellitus with diabetic autonomic (poly)neuropathy: Secondary | ICD-10-CM | POA: Diagnosis not present

## 2018-12-30 DIAGNOSIS — I5032 Chronic diastolic (congestive) heart failure: Secondary | ICD-10-CM | POA: Diagnosis not present

## 2018-12-30 DIAGNOSIS — I130 Hypertensive heart and chronic kidney disease with heart failure and stage 1 through stage 4 chronic kidney disease, or unspecified chronic kidney disease: Secondary | ICD-10-CM | POA: Diagnosis not present

## 2018-12-30 DIAGNOSIS — E1151 Type 2 diabetes mellitus with diabetic peripheral angiopathy without gangrene: Secondary | ICD-10-CM | POA: Diagnosis not present

## 2018-12-30 DIAGNOSIS — E1122 Type 2 diabetes mellitus with diabetic chronic kidney disease: Secondary | ICD-10-CM | POA: Diagnosis not present

## 2018-12-30 DIAGNOSIS — N184 Chronic kidney disease, stage 4 (severe): Secondary | ICD-10-CM | POA: Diagnosis not present

## 2019-01-05 ENCOUNTER — Encounter: Payer: Self-pay | Admitting: Internal Medicine

## 2019-01-06 DIAGNOSIS — H401113 Primary open-angle glaucoma, right eye, severe stage: Secondary | ICD-10-CM | POA: Diagnosis not present

## 2019-01-10 DIAGNOSIS — I10 Essential (primary) hypertension: Secondary | ICD-10-CM | POA: Diagnosis not present

## 2019-01-10 DIAGNOSIS — N183 Chronic kidney disease, stage 3 (moderate): Secondary | ICD-10-CM | POA: Diagnosis not present

## 2019-01-10 DIAGNOSIS — E1122 Type 2 diabetes mellitus with diabetic chronic kidney disease: Secondary | ICD-10-CM | POA: Diagnosis not present

## 2019-01-10 DIAGNOSIS — E782 Mixed hyperlipidemia: Secondary | ICD-10-CM | POA: Diagnosis not present

## 2019-01-11 DIAGNOSIS — D638 Anemia in other chronic diseases classified elsewhere: Secondary | ICD-10-CM | POA: Diagnosis not present

## 2019-01-11 DIAGNOSIS — E1122 Type 2 diabetes mellitus with diabetic chronic kidney disease: Secondary | ICD-10-CM | POA: Diagnosis not present

## 2019-01-11 DIAGNOSIS — K3184 Gastroparesis: Secondary | ICD-10-CM | POA: Diagnosis not present

## 2019-01-11 DIAGNOSIS — E1143 Type 2 diabetes mellitus with diabetic autonomic (poly)neuropathy: Secondary | ICD-10-CM | POA: Diagnosis not present

## 2019-01-11 DIAGNOSIS — Z9114 Patient's other noncompliance with medication regimen: Secondary | ICD-10-CM | POA: Diagnosis not present

## 2019-01-11 DIAGNOSIS — R4182 Altered mental status, unspecified: Secondary | ICD-10-CM | POA: Diagnosis not present

## 2019-01-11 DIAGNOSIS — E1129 Type 2 diabetes mellitus with other diabetic kidney complication: Secondary | ICD-10-CM | POA: Diagnosis not present

## 2019-01-11 DIAGNOSIS — F209 Schizophrenia, unspecified: Secondary | ICD-10-CM | POA: Diagnosis not present

## 2019-01-11 DIAGNOSIS — I10 Essential (primary) hypertension: Secondary | ICD-10-CM | POA: Diagnosis not present

## 2019-01-11 DIAGNOSIS — R1084 Generalized abdominal pain: Secondary | ICD-10-CM | POA: Diagnosis not present

## 2019-01-11 DIAGNOSIS — I130 Hypertensive heart and chronic kidney disease with heart failure and stage 1 through stage 4 chronic kidney disease, or unspecified chronic kidney disease: Secondary | ICD-10-CM | POA: Diagnosis not present

## 2019-01-11 DIAGNOSIS — R451 Restlessness and agitation: Secondary | ICD-10-CM | POA: Diagnosis not present

## 2019-01-11 DIAGNOSIS — I129 Hypertensive chronic kidney disease with stage 1 through stage 4 chronic kidney disease, or unspecified chronic kidney disease: Secondary | ICD-10-CM | POA: Diagnosis not present

## 2019-01-11 DIAGNOSIS — D649 Anemia, unspecified: Secondary | ICD-10-CM | POA: Diagnosis not present

## 2019-01-11 DIAGNOSIS — N189 Chronic kidney disease, unspecified: Secondary | ICD-10-CM | POA: Diagnosis not present

## 2019-01-11 DIAGNOSIS — I160 Hypertensive urgency: Secondary | ICD-10-CM | POA: Diagnosis not present

## 2019-01-11 DIAGNOSIS — N184 Chronic kidney disease, stage 4 (severe): Secondary | ICD-10-CM | POA: Diagnosis not present

## 2019-01-11 DIAGNOSIS — R109 Unspecified abdominal pain: Secondary | ICD-10-CM | POA: Diagnosis not present

## 2019-01-11 DIAGNOSIS — R112 Nausea with vomiting, unspecified: Secondary | ICD-10-CM | POA: Diagnosis not present

## 2019-01-11 DIAGNOSIS — N179 Acute kidney failure, unspecified: Secondary | ICD-10-CM | POA: Diagnosis not present

## 2019-01-11 DIAGNOSIS — F419 Anxiety disorder, unspecified: Secondary | ICD-10-CM | POA: Diagnosis not present

## 2019-01-17 ENCOUNTER — Ambulatory Visit (HOSPITAL_COMMUNITY): Admission: RE | Admit: 2019-01-17 | Payer: Medicare HMO | Source: Ambulatory Visit

## 2019-01-20 DIAGNOSIS — E1129 Type 2 diabetes mellitus with other diabetic kidney complication: Secondary | ICD-10-CM | POA: Diagnosis not present

## 2019-01-20 DIAGNOSIS — I129 Hypertensive chronic kidney disease with stage 1 through stage 4 chronic kidney disease, or unspecified chronic kidney disease: Secondary | ICD-10-CM | POA: Diagnosis not present

## 2019-01-20 DIAGNOSIS — E1143 Type 2 diabetes mellitus with diabetic autonomic (poly)neuropathy: Secondary | ICD-10-CM | POA: Diagnosis not present

## 2019-01-20 DIAGNOSIS — I5032 Chronic diastolic (congestive) heart failure: Secondary | ICD-10-CM | POA: Diagnosis not present

## 2019-01-20 DIAGNOSIS — R600 Localized edema: Secondary | ICD-10-CM | POA: Diagnosis not present

## 2019-01-20 DIAGNOSIS — I130 Hypertensive heart and chronic kidney disease with heart failure and stage 1 through stage 4 chronic kidney disease, or unspecified chronic kidney disease: Secondary | ICD-10-CM | POA: Diagnosis not present

## 2019-01-20 DIAGNOSIS — N184 Chronic kidney disease, stage 4 (severe): Secondary | ICD-10-CM | POA: Diagnosis not present

## 2019-01-20 DIAGNOSIS — E1151 Type 2 diabetes mellitus with diabetic peripheral angiopathy without gangrene: Secondary | ICD-10-CM | POA: Diagnosis not present

## 2019-01-20 DIAGNOSIS — E1122 Type 2 diabetes mellitus with diabetic chronic kidney disease: Secondary | ICD-10-CM | POA: Diagnosis not present

## 2019-01-27 DIAGNOSIS — E1151 Type 2 diabetes mellitus with diabetic peripheral angiopathy without gangrene: Secondary | ICD-10-CM | POA: Diagnosis not present

## 2019-01-27 DIAGNOSIS — E1143 Type 2 diabetes mellitus with diabetic autonomic (poly)neuropathy: Secondary | ICD-10-CM | POA: Diagnosis not present

## 2019-01-27 DIAGNOSIS — E1122 Type 2 diabetes mellitus with diabetic chronic kidney disease: Secondary | ICD-10-CM | POA: Diagnosis not present

## 2019-01-27 DIAGNOSIS — I5032 Chronic diastolic (congestive) heart failure: Secondary | ICD-10-CM | POA: Diagnosis not present

## 2019-01-27 DIAGNOSIS — I130 Hypertensive heart and chronic kidney disease with heart failure and stage 1 through stage 4 chronic kidney disease, or unspecified chronic kidney disease: Secondary | ICD-10-CM | POA: Diagnosis not present

## 2019-01-27 DIAGNOSIS — N184 Chronic kidney disease, stage 4 (severe): Secondary | ICD-10-CM | POA: Diagnosis not present

## 2019-01-28 ENCOUNTER — Other Ambulatory Visit: Payer: Self-pay

## 2019-01-28 ENCOUNTER — Ambulatory Visit (HOSPITAL_COMMUNITY)
Admission: RE | Admit: 2019-01-28 | Discharge: 2019-01-28 | Disposition: A | Discharge status: Home / Self Care | Payer: Medicare HMO | Source: Ambulatory Visit | Attending: Gastroenterology | Admitting: Gastroenterology

## 2019-01-28 ENCOUNTER — Encounter (HOSPITAL_COMMUNITY): Payer: Self-pay

## 2019-01-28 DIAGNOSIS — K3184 Gastroparesis: Secondary | ICD-10-CM | POA: Insufficient documentation

## 2019-01-28 DIAGNOSIS — G8929 Other chronic pain: Secondary | ICD-10-CM | POA: Diagnosis not present

## 2019-01-28 DIAGNOSIS — R109 Unspecified abdominal pain: Secondary | ICD-10-CM

## 2019-01-28 HISTORY — DX: Disorder of kidney and ureter, unspecified: N28.9

## 2019-01-28 HISTORY — DX: Heart failure, unspecified: I50.9

## 2019-01-28 MED ORDER — SINCALIDE 5 MCG IJ SOLR
INTRAMUSCULAR | Status: AC
  Administered 2019-01-28: 2.8 ug via INTRAVENOUS
  Filled 2019-01-28: qty 5

## 2019-01-28 MED ORDER — STERILE WATER FOR INJECTION IJ SOLN
INTRAMUSCULAR | Status: AC
  Administered 2019-01-28: 12:00:00 2.8 mL via INTRAVENOUS
  Filled 2019-01-28: qty 10

## 2019-01-28 MED ORDER — TECHNETIUM TC 99M MEBROFENIN IV KIT
5.0000 | PACK | Freq: Once | INTRAVENOUS | Status: AC | PRN
  Administered 2019-01-28: 11:00:00 5 via INTRAVENOUS

## 2019-01-28 MED ORDER — SODIUM CHLORIDE FLUSH 0.9 % IV SOLN
INTRAVENOUS | Status: AC
  Filled 2019-01-28: qty 40

## 2019-01-29 DIAGNOSIS — N183 Chronic kidney disease, stage 3 (moderate): Secondary | ICD-10-CM | POA: Diagnosis not present

## 2019-01-29 DIAGNOSIS — I509 Heart failure, unspecified: Secondary | ICD-10-CM | POA: Diagnosis not present

## 2019-01-29 DIAGNOSIS — E1122 Type 2 diabetes mellitus with diabetic chronic kidney disease: Secondary | ICD-10-CM | POA: Diagnosis not present

## 2019-01-29 DIAGNOSIS — E118 Type 2 diabetes mellitus with unspecified complications: Secondary | ICD-10-CM | POA: Diagnosis not present

## 2019-02-02 DIAGNOSIS — H4051X3 Glaucoma secondary to other eye disorders, right eye, severe stage: Secondary | ICD-10-CM | POA: Diagnosis not present

## 2019-02-02 DIAGNOSIS — E113593 Type 2 diabetes mellitus with proliferative diabetic retinopathy without macular edema, bilateral: Secondary | ICD-10-CM | POA: Diagnosis not present

## 2019-02-02 DIAGNOSIS — H4720 Unspecified optic atrophy: Secondary | ICD-10-CM | POA: Diagnosis not present

## 2019-02-02 DIAGNOSIS — E113592 Type 2 diabetes mellitus with proliferative diabetic retinopathy without macular edema, left eye: Secondary | ICD-10-CM | POA: Diagnosis not present

## 2019-02-02 DIAGNOSIS — H4312 Vitreous hemorrhage, left eye: Secondary | ICD-10-CM | POA: Diagnosis not present

## 2019-02-02 NOTE — Progress Notes (Signed)
HIDA scan with normal EF. He should be having a follow-up appt soon.

## 2019-02-04 DIAGNOSIS — E1143 Type 2 diabetes mellitus with diabetic autonomic (poly)neuropathy: Secondary | ICD-10-CM | POA: Diagnosis not present

## 2019-02-04 DIAGNOSIS — N184 Chronic kidney disease, stage 4 (severe): Secondary | ICD-10-CM | POA: Diagnosis not present

## 2019-02-04 DIAGNOSIS — I130 Hypertensive heart and chronic kidney disease with heart failure and stage 1 through stage 4 chronic kidney disease, or unspecified chronic kidney disease: Secondary | ICD-10-CM | POA: Diagnosis not present

## 2019-02-04 DIAGNOSIS — I5032 Chronic diastolic (congestive) heart failure: Secondary | ICD-10-CM | POA: Diagnosis not present

## 2019-02-04 DIAGNOSIS — E1151 Type 2 diabetes mellitus with diabetic peripheral angiopathy without gangrene: Secondary | ICD-10-CM | POA: Diagnosis not present

## 2019-02-04 DIAGNOSIS — E1122 Type 2 diabetes mellitus with diabetic chronic kidney disease: Secondary | ICD-10-CM | POA: Diagnosis not present

## 2019-02-09 DIAGNOSIS — N183 Chronic kidney disease, stage 3 (moderate): Secondary | ICD-10-CM | POA: Diagnosis not present

## 2019-02-09 DIAGNOSIS — E1122 Type 2 diabetes mellitus with diabetic chronic kidney disease: Secondary | ICD-10-CM | POA: Diagnosis not present

## 2019-02-09 DIAGNOSIS — I10 Essential (primary) hypertension: Secondary | ICD-10-CM | POA: Diagnosis not present

## 2019-02-09 DIAGNOSIS — E782 Mixed hyperlipidemia: Secondary | ICD-10-CM | POA: Diagnosis not present

## 2019-02-10 DIAGNOSIS — E1122 Type 2 diabetes mellitus with diabetic chronic kidney disease: Secondary | ICD-10-CM | POA: Diagnosis not present

## 2019-02-10 DIAGNOSIS — N184 Chronic kidney disease, stage 4 (severe): Secondary | ICD-10-CM | POA: Diagnosis not present

## 2019-02-10 DIAGNOSIS — I130 Hypertensive heart and chronic kidney disease with heart failure and stage 1 through stage 4 chronic kidney disease, or unspecified chronic kidney disease: Secondary | ICD-10-CM | POA: Diagnosis not present

## 2019-02-10 DIAGNOSIS — I5032 Chronic diastolic (congestive) heart failure: Secondary | ICD-10-CM | POA: Diagnosis not present

## 2019-02-10 DIAGNOSIS — E1151 Type 2 diabetes mellitus with diabetic peripheral angiopathy without gangrene: Secondary | ICD-10-CM | POA: Diagnosis not present

## 2019-02-10 DIAGNOSIS — E1143 Type 2 diabetes mellitus with diabetic autonomic (poly)neuropathy: Secondary | ICD-10-CM | POA: Diagnosis not present

## 2019-02-17 ENCOUNTER — Other Ambulatory Visit (INDEPENDENT_AMBULATORY_CARE_PROVIDER_SITE_OTHER): Payer: Self-pay | Admitting: Vascular Surgery

## 2019-02-17 DIAGNOSIS — N186 End stage renal disease: Secondary | ICD-10-CM

## 2019-02-18 ENCOUNTER — Ambulatory Visit: Payer: Medicare HMO | Admitting: Gastroenterology

## 2019-02-18 DIAGNOSIS — E1143 Type 2 diabetes mellitus with diabetic autonomic (poly)neuropathy: Secondary | ICD-10-CM | POA: Diagnosis not present

## 2019-02-18 DIAGNOSIS — E1122 Type 2 diabetes mellitus with diabetic chronic kidney disease: Secondary | ICD-10-CM | POA: Diagnosis not present

## 2019-02-18 DIAGNOSIS — E1151 Type 2 diabetes mellitus with diabetic peripheral angiopathy without gangrene: Secondary | ICD-10-CM | POA: Diagnosis not present

## 2019-02-18 DIAGNOSIS — I130 Hypertensive heart and chronic kidney disease with heart failure and stage 1 through stage 4 chronic kidney disease, or unspecified chronic kidney disease: Secondary | ICD-10-CM | POA: Diagnosis not present

## 2019-02-18 DIAGNOSIS — I5032 Chronic diastolic (congestive) heart failure: Secondary | ICD-10-CM | POA: Diagnosis not present

## 2019-02-18 DIAGNOSIS — N184 Chronic kidney disease, stage 4 (severe): Secondary | ICD-10-CM | POA: Diagnosis not present

## 2019-02-21 ENCOUNTER — Encounter (INDEPENDENT_AMBULATORY_CARE_PROVIDER_SITE_OTHER): Payer: Self-pay

## 2019-02-21 ENCOUNTER — Ambulatory Visit (INDEPENDENT_AMBULATORY_CARE_PROVIDER_SITE_OTHER): Payer: Medicare HMO

## 2019-02-21 ENCOUNTER — Other Ambulatory Visit: Payer: Self-pay

## 2019-02-21 ENCOUNTER — Encounter (INDEPENDENT_AMBULATORY_CARE_PROVIDER_SITE_OTHER): Payer: Self-pay | Admitting: Vascular Surgery

## 2019-02-21 ENCOUNTER — Ambulatory Visit (INDEPENDENT_AMBULATORY_CARE_PROVIDER_SITE_OTHER): Payer: Medicare HMO | Admitting: Vascular Surgery

## 2019-02-21 VITALS — BP 134/67 | HR 73 | Resp 16 | Ht 72.0 in | Wt 300.0 lb

## 2019-02-21 DIAGNOSIS — E785 Hyperlipidemia, unspecified: Secondary | ICD-10-CM | POA: Diagnosis not present

## 2019-02-21 DIAGNOSIS — N186 End stage renal disease: Secondary | ICD-10-CM

## 2019-02-21 DIAGNOSIS — N185 Chronic kidney disease, stage 5: Secondary | ICD-10-CM | POA: Diagnosis not present

## 2019-02-21 DIAGNOSIS — E1122 Type 2 diabetes mellitus with diabetic chronic kidney disease: Secondary | ICD-10-CM | POA: Diagnosis not present

## 2019-02-21 DIAGNOSIS — Z79899 Other long term (current) drug therapy: Secondary | ICD-10-CM | POA: Diagnosis not present

## 2019-02-21 DIAGNOSIS — I878 Other specified disorders of veins: Secondary | ICD-10-CM

## 2019-02-21 DIAGNOSIS — K219 Gastro-esophageal reflux disease without esophagitis: Secondary | ICD-10-CM

## 2019-02-21 DIAGNOSIS — I120 Hypertensive chronic kidney disease with stage 5 chronic kidney disease or end stage renal disease: Secondary | ICD-10-CM | POA: Diagnosis not present

## 2019-02-21 DIAGNOSIS — IMO0002 Reserved for concepts with insufficient information to code with codable children: Secondary | ICD-10-CM

## 2019-02-21 DIAGNOSIS — Z794 Long term (current) use of insulin: Secondary | ICD-10-CM

## 2019-02-21 DIAGNOSIS — E1165 Type 2 diabetes mellitus with hyperglycemia: Secondary | ICD-10-CM | POA: Diagnosis not present

## 2019-02-21 DIAGNOSIS — I10 Essential (primary) hypertension: Secondary | ICD-10-CM

## 2019-02-21 NOTE — Progress Notes (Signed)
MRN : [id number]  Timothy Clark is a 48 y.o. (01/30/1971) male who presents with chief complaint of  Chief Complaint  Patient presents with  . New Patient (Initial Visit)    ref Candiss Norse vein mapping  .  History of Present Illness:   The patient is seen for evaluation for dialysis access. The patient has chronic renal insufficiency stage V secondary to hypertension and diabetes. The patient's most recent creatinine clearance is less than 20. The patient volume status has not yet become an issue. Patient's blood pressures been relatively well controlled. There are mild uremic symptoms which appear to be relatively well tolerated at this time. The patient is right-handed.  The patient has been considering the various methods of dialysis and wishes to proceed with hemodialysis and therefore creation of AV access.  The patient denies amaurosis fugax or recent TIA symptoms. There are no recent neurological changes noted. The patient denies claudication symptoms or rest pain symptoms. The patient denies history of DVT, PE or superficial thrombophlebitis. The patient denies recent episodes of angina or shortness of breath.   Current Meds  Medication Sig  . amLODipine (NORVASC) 10 MG tablet Take 1 tablet (10 mg total) by mouth daily.  [name] atorvastatin (LIPITOR) 10 MG tablet Take 10 mg by mouth at bedtime.   . Blood Glucose Monitoring Suppl (PRODIGY AUTOCODE BLOOD GLUCOSE) w/Device KIT   . brimonidine (ALPHAGAN) 0.2 % ophthalmic solution Place 1 drop into both eyes 3 (three) times daily.   . carvedilol (COREG) 25 MG tablet Take 25 mg by mouth 2 (two) times daily with a meal.  . cloNIDine (CATAPRES) 0.2 MG tablet Take 0.4 mg by mouth 2 (two) times daily.   . cyanocobalamin (,VITAMIN B-12,) 1000 MCG/ML injection INJECT 1 ML (CC) INTRAMUSCULARLY ONCE A WEEK FOR 4 WEEKS THEN MONTHLY  . dicyclomine (BENTYL) 10 MG capsule Take 1 capsule by mouth as needed.  . dorzolamide (TRUSOPT) 2 % ophthalmic  solution INSTILL 1 DROPS INTO EACH EYE TWICE DAILY  . folic acid (FOLVITE) 1 MG tablet Take 1 mg by mouth daily.  . hydrALAZINE (APRESOLINE) 100 MG tablet Take 100 mg by mouth 3 (three) times daily.  . insulin aspart protamine- aspart (NOVOLOG MIX 70/30) (70-30) 100 UNIT/ML injection Inject 0.25 mLs (25 Units total) into the skin 2 (two) times daily with a meal. (Patient taking differently: Inject 25-28 Units into the skin 2 (two) times daily with a meal. 28 units in morning and 25 units at night)  . lactulose (CEPHULAC) 10 g packet Take 1 packet (10 g total) by mouth every morning.  [name] LORazepam (ATIVAN) 1 MG tablet Take 1 mg by mouth 2 (two) times daily as needed. for anxiety  . metoCLOPramide (REGLAN) 5 MG tablet Take 1 tablet (5 mg total) by mouth 2 (two) times daily at 8 am and 10 pm.  . pantoprazole (PROTONIX) 40 MG tablet Take 1 tablet (40 mg total) by mouth 2 (two) times daily before a meal.  . potassium chloride SA (K-DUR,KLOR-CON) 20 MEQ tablet Take 1 tablet (20 mEq total) by mouth 2 (two) times daily. (Patient taking differently: Take 20 mEq by mouth daily. )  . promethazine (PHENERGAN) 25 MG tablet Take 0.5 tablets (12.5 mg total) by mouth every 8 (eight) hours as needed for nausea or vomiting. To one tablet every 6 hours  . sitaGLIPtin (JANUVIA) 50 MG tablet Take 50 mg by mouth daily.  . sodium bicarbonate 650 MG tablet Take 1,300 mg by  mouth 3 (three) times daily.  . sucralfate (CARAFATE) 1 g tablet Take 1 g by mouth 4 (four) times daily -  with meals and at bedtime.  . timolol (TIMOPTIC) 0.5 % ophthalmic solution Place 1 drop into both eyes 2 (two) times daily.    Past Medical History:  Diagnosis Date  . CHF (congestive heart failure) ([place])   . Chronic abdominal pain   . Esophagitis   . Eye hemorrhage   . Gastritis   . Gastroparesis   . GERD (gastroesophageal reflux disease)   . Glaucoma   . Helicobacter pylori (H. pylori) infection 2016   ERADICATION DOCUMENTED VIA STOOL  TEST in AUG 2016 at [facility]  . Hiccups   . Hypertension   . Nausea and vomiting    chronic, recurrent  . Renal insufficiency   . Type 2 diabetes mellitus with complications (HCC)    diagnosed around age 65    Past Surgical History:  Procedure Laterality Date  . CATARACT EXTRACTION W/PHACO Left 05/19/2016   Procedure: CATARACT EXTRACTION PHACO AND INTRAOCULAR LENS PLACEMENT LEFT EYE;  Surgeon: Tonny Branch, MD;  Location: [facility];  Service: Ophthalmology;  Laterality: Left;  CDE: 7.30  . CATARACT EXTRACTION W/PHACO Right 06/23/2016   Procedure: CATARACT EXTRACTION PHACO AND INTRAOCULAR LENS PLACEMENT RIGHT EYE CDE=9.87;  Surgeon: Tonny Branch, MD;  Location: [facility];  Service: Ophthalmology;  Laterality: Right;  right  . ESOPHAGOGASTRODUODENOSCOPY  2015   Dr. Britta Mccreedy  . ESOPHAGOGASTRODUODENOSCOPY N/A 10/26/2014   RMR: Distal esophagititis-likely reflux related although an element of pill induced injuruy no exclueded  status post biopsy. Diffusely abnormal gastric mucosa of uncertain signigicane -status post gastric biopsy. Focal area of excoriation in the cardia most consistant with a trauma of heaving.   . ESOPHAGOGASTRODUODENOSCOPY  08/2015   [facility]: mild esophagitis and old gastric contents  . EYE SURGERY  2015   stent placed to left eye  . EYE SURGERY    . INCISION AND DRAINAGE OF WOUND Right 07/16/2017   Procedure: IRRIGATION AND DEBRIDEMENT OF SOFT TISSUE OF ULCERATION RIGHT FOOT;  Surgeon: Caprice Beaver, DPM;  Location: [facility];  Service: Podiatry;  Laterality: Right;    Social History Social History   Tobacco Use  . Smoking status: Never Smoker  . Smokeless tobacco: Never Used  Substance Use Topics  . Alcohol use: No    Alcohol/week: 0.0 standard drinks  . Drug use: No    Family History Family History  Problem Relation Age of Onset  . Ovarian cancer Mother   . Heart attack Father   . Cervical cancer Sister   . Diabetes Sister   . Colon cancer Neg Hx   No family  history of bleeding/clotting disorders, porphyria or autoimmune disease   Allergies  Allergen Reactions  . Donnatal [Pb-Hyoscy-Atropine-Scopolamine] Anaphylaxis    Reaction to GI cocktail  . Fish Allergy Anaphylaxis, Hives and Rash  . Haldol [Haloperidol Lactate] Other (See Comments)    Chest Pain  . Lidocaine Anaphylaxis    Reaction to GI cocktail  . Maalox [Calcium Carbonate Antacid] Anaphylaxis    Reaction to GI cocktail  . Marinol [Dronabinol]     Caused worsening vomiting.   . Aspirin Hives and Itching  . Bactrim [Sulfamethoxazole-Trimethoprim] Itching  . Doxycycline Hives and Nausea And Vomiting    Severe   . Keflex [Cephalexin] Hives, Itching and Nausea And Vomiting  . Penicillins Hives and Itching    Has patient had a PCN reaction causing immediate rash, facial/tongue/throat swelling,  SOB or lightheadedness with hypotension: yes Has patient had a PCN reaction causing severe rash involving mucus membranes or skin necrosis: yes Has patient had a PCN reaction that required hospitalization no Has patient had a PCN reaction occurring within the last 10 years: yes If all of the above answers are "NO", then may proceed with Cephalospor     REVIEW OF SYSTEMS (Negative unless checked)  Constitutional: '[]' Weight loss  '[]' Fever  '[]' Chills Cardiac: '[]' Chest pain   '[]' Chest pressure   '[]' Palpitations   '[]' Shortness of breath when laying flat   '[]' Shortness of breath with exertion. Vascular:  '[]' Pain in legs with walking   '[]' Pain in legs at rest  '[]' History of DVT   '[]' Phlebitis   '[]' Swelling in legs   '[]' Varicose veins   '[]' Non-healing ulcers Pulmonary:   '[]' Uses home oxygen   '[]' Productive cough   '[]' Hemoptysis   '[]' Wheeze  '[]' COPD   '[]' Asthma Neurologic:  '[]' Dizziness   '[]' Seizures   '[]' History of stroke   '[]' History of TIA  '[]' Aphasia   '[]' Vissual changes   '[]' Weakness or numbness in arm   '[]' Weakness or numbness in leg Musculoskeletal:   '[]' Joint swelling   '[]' Joint pain   '[]' Low back pain Hematologic:  '[]' Easy  bruising  '[]' Easy bleeding   '[]' Hypercoagulable state   '[]' Anemic Gastrointestinal:  '[]' Diarrhea   '[]' Vomiting  '[]' Gastroesophageal reflux/heartburn   '[]' Difficulty swallowing. Genitourinary:  '[x]' Chronic kidney disease   '[]' Difficult urination  '[]' Frequent urination   '[]' Blood in urine Skin:  '[]' Rashes   '[]' Ulcers  Psychological:  '[]' History of anxiety   '[]'  History of major depression.  Physical Examination  Vitals:   02/21/19 1503  BP: 134/67  Pulse: 73  Resp: 16  Weight: 300 lb (136.1 kg)  Height: 6' (1.829 m)   Body mass index is 40.69 kg/m. Gen: WD/WN, NAD Head: [place]/AT, No temporalis wasting.  Ear/Nose/Throat: Hearing grossly intact, nares w/o erythema or drainage, poor dentition Eyes: PER, EOMI, sclera nonicteric.  Neck: Supple, no masses.  No bruit or JVD.  Pulmonary:  Good air movement, clear to auscultation bilaterally, no use of accessory muscles.  Cardiac: RRR, normal S1, S2, no Murmurs. Vascular:  No visible veins in either arm Vessel Right Left  Radial Palpable Palpable  Brachial Palpable Palpable  Carotid Palpable Palpable  Gastrointestinal: soft, non-distended. No guarding/no peritoneal signs.  Musculoskeletal: M/S 5/5 throughout.  No deformity or atrophy.  Neurologic: CN 2-12 intact. Pain and light touch intact in extremities.  Symmetrical.  Speech is fluent. Motor exam as listed above. Psychiatric: Judgment intact, Mood & affect appropriate for pt's clinical situation. Dermatologic: No rashes or ulcers noted.  No changes consistent with cellulitis. Lymph : No Cervical lymphadenopathy, no lichenification or skin changes of chronic lymphedema.  CBC Lab Results  Component Value Date   WBC 5.7 10/25/2018   HGB 7.5 (L) 10/25/2018   HCT 23.6 (L) 10/25/2018   MCV 93.3 10/25/2018   PLT 156 10/25/2018    BMET    Component Value Date/Time   NA 139 10/27/2018 0535   K 3.7 10/27/2018 0535   CL 112 (H) 10/27/2018 0535   CO2 21 (L) 10/27/2018 0535   GLUCOSE 133 (H)  10/27/2018 0535   BUN 19 10/27/2018 0535   CREATININE 3.57 (H) 10/27/2018 0535   CREATININE 1.20 06/22/2017 1436   CALCIUM 8.2 (L) 10/27/2018 0535   GFRNONAA 19 (L) 10/27/2018 0535   GFRAA 22 (L) 10/27/2018 0535   CrCl cannot be calculated (Patient's most recent lab result is older than the maximum  21 days allowed.).  COAG Lab Results  Component Value Date   INR 1.1 10/25/2018   INR 1.05 06/13/2018   INR 1.25 10/30/2017    Radiology Nm Hepato W/eject Fract  Result Date: 01/28/2019 CLINICAL DATA:  Chronic abdominal pain for 5 years with nausea and vomiting, worsened symptoms with fatty foods, history CHF, gastric paresis, type II diabetes mellitus, hypertension EXAM: NUCLEAR MEDICINE HEPATOBILIARY IMAGING WITH GALLBLADDER EF TECHNIQUE: Sequential images of the abdomen were obtained out to 60 minutes following intravenous administration of radiopharmaceutical. After slow intravenous infusion of 2.80 micrograms Cholecystokinin, gallbladder ejection fraction was determined. RADIOPHARMACEUTICALS:  5 mCi Tc-18mCholetec IV COMPARISON:  None FINDINGS: Normal tracer extraction from bloodstream indicating normal hepatocellular function. Normal excretion of tracer into biliary tree. Gallbladder visualized at 19 min. Small bowel visualized at 7 min. No hepatic retention of tracer. Subjectively normal emptying of tracer from gallbladder following CCK administration. Calculated gallbladder ejection fraction is 77%, normal. Patient reported no symptoms following CCK administration. Normal gallbladder ejection fraction following CCK stimulation is greater than 40% at 1 hour. IMPRESSION: Normal exam. Electronically Signed   By: MLavonia DanaM.D.   On: 01/28/2019 17:20     Assessment/Plan 1. Chronic renal insufficiency, stage V (HCC) Recommend:  The patient is experiencing increasing symptoms from his advanced renal insufficiency.  His vein mapping shows he has small superficial veins with his basilic  being slightly larger but still marginal.  He has 3 + mm perforators bilaterally.  Given these findings I recommend a left arm WavelinQ.  The intention for intervention is to restore appropriate flow and prevent thrombosis and possible loss of the access.  As well as improve the quality of dialysis therapy.  The risks, benefits and alternative therapies were reviewed in detail with the patient.  All questions were answered.  The patient agrees to proceed with ang[facility].    - Ambulatory referral to Cardiology  2. Diabetes mellitus type 2 with complications, uncontrolled ([place] Continue hypoglycemic medications as already ordered, these medications have been reviewed and there are no changes at this time.  Hgb A1C to be monitored as already arranged by primary service   3. Essential hypertension Continue antihypertensive medications as already ordered, these medications have been reviewed and there are no changes at this time.  - Ambulatory referral to Cardiology  4. Gastroesophageal reflux disease without esophagitis Continue PPI as already ordered, this medication has been reviewed and there are no changes at this time.  Avoidence of caffeine and alcohol  Moderate elevation of the head of the bed   5. Hyperlipidemia, unspecified hyperlipidemia type Continue statin as ordered and reviewed, no changes at this time   6. Poor venous access This is a very problematic diagnosis for someone who hasn't even started dialysis.  Given the findings from the vein mapping I recommend WavelinQ left arm    GHortencia Pilar MD  02/21/2019 3:12 PM

## 2019-02-23 ENCOUNTER — Ambulatory Visit: Payer: Medicare HMO | Admitting: Cardiology

## 2019-02-23 ENCOUNTER — Encounter: Payer: Self-pay | Admitting: *Deleted

## 2019-02-23 ENCOUNTER — Other Ambulatory Visit: Payer: Self-pay

## 2019-02-23 ENCOUNTER — Encounter: Payer: Self-pay | Admitting: Cardiology

## 2019-02-23 VITALS — BP 165/77 | HR 68 | Temp 99.3°F | Ht 72.0 in | Wt 304.0 lb

## 2019-02-23 DIAGNOSIS — I5189 Other ill-defined heart diseases: Secondary | ICD-10-CM | POA: Diagnosis not present

## 2019-02-23 DIAGNOSIS — Z01810 Encounter for preprocedural cardiovascular examination: Secondary | ICD-10-CM | POA: Diagnosis not present

## 2019-02-23 DIAGNOSIS — I10 Essential (primary) hypertension: Secondary | ICD-10-CM | POA: Diagnosis not present

## 2019-02-23 DIAGNOSIS — E782 Mixed hyperlipidemia: Secondary | ICD-10-CM

## 2019-02-23 NOTE — Patient Instructions (Signed)

## 2019-02-23 NOTE — Progress Notes (Signed)
Clinical Summary Timothy Clark is a 48 y.o.male seen as new consult, referred by Dr Delana Meyer for preoperative evaluation   1. Preoperative evaluation - being - no recent chest pain, no SOB/DOE - walks regularly 30 minutes daily, no symptoms.  - ambulation somewhat limited to prior foot surgeries.  - can walk to up 1 mile at regular pace without troubles   2. PAD - followed by Dr Gwenlyn Found - history of chronic foot ulcerations, prior surgeries.  - ASA allergy  3. DM2 - followed by pcp  4. Hyperlipidemia - followed by pcp, on statin   5. HTN - checks regularly at home. 140/70s at home after meds.  - he reports nephrology has been titrating meds. Reports 49m caused LE edema.     6. Diastolic dysfunction - 114/4818echo: LVEF 60-65%, grade II diastolic dysfunction - can have some LE edema - no SOB or DOE. No orthopnea.   7. ESRD -Followed by Dr SCandiss Norse- has not started HD as of yet.    Past Medical History:  Diagnosis Date  . [place]) [place]   . Chronic abdominal pain   . Esophagitis   . Eye hemorrhage   . Gastritis   . Gastroparesis   . GERD (gastroesophageal reflux disease)   . Glaucoma   . Helicobacter pylori (H. pylori) infection 2016   ERAD[place] [facility] [place] . H[place]cups   . Hypertension   . Nausea and vomiting    chronic, recurrent  . Renal insufficiency   . Type 2 diabetes mellitus with complications (HCC)    diagnosed around age 48    Allergies  Allergen Reactions  . Donnatal [Pb-Hyoscy-Atropine-Scopolamine] Anaphylaxis    Reaction to GI cocktail  . Fish Allergy Anaphylaxis, Hives and Rash  . Haldol [Haloperidol Lactate] Other (See Comments)    Chest Pain  . Lidocaine Anaphylaxis    Reaction to GI cocktail  . Maalox [Calcium Carbonate Antacid] Anaphylaxis    Reaction to GI cocktail  . Marinol [Dronabinol]     Caused worsening vomiting.   . Aspirin Hives and Itching  . Bactrim  [Sulfamethoxazole-Trimethoprim] Itching  . Doxycycline Hives and Nausea And Vomiting    Severe   . Keflex [Cephalexin] Hives, Itching and Nausea And Vomiting  . Penicillins Hives and Itching    Has patient had a PCN reaction causing immediate rash, facial/tongue/throat swelling, SOB or lightheadedness with hypotension: yes Has patient had a PCN reaction causing severe rash involving mucus membranes or skin necrosis: yes Has patient had a PCN reaction that required hospitalization no Has patient had a PCN reaction occurring within the last 10 years: yes If all of the above answers are "NO", then may proceed with Cephalospor     Current Outpatient Medications  Medication Sig Dispense Refill  . amLODipine (NORVASC) 10 MG tablet Take 1 tablet (10 mg total) by mouth daily. 30 tablet 1  . atorvastatin (LIPITOR) 10 MG tablet Take 10 mg by mouth at bedtime.     . Blood Glucose Monitoring Suppl (PRODIGY AUTOCODE BLOOD GLUCOSE) w/Device KIT     . brimonidine (ALPHAGAN) 0.2 % ophthalmic solution Place 1 drop into both eyes 3 (three) times daily.     . carvedilol (COREG) 25 MG tablet Take 25 mg by mouth 2 (two) times daily with a meal.    . cloNIDine (CATAPRES) 0.2 MG tablet Take 0.4 mg by mouth 2 (two) times daily.     .[name]  cyanocobalamin (,VITAMIN B-12,) 1000 MCG/ML injection INJECT 1 ML (CC) INTRAMUSCULARLY ONCE A WEEK FOR 4 WEEKS THEN MONTHLY  1  . dicyclomine (BENTYL) 10 MG capsule Take 1 capsule by mouth as needed.    . dorzolamide (TRUSOPT) 2 % ophthalmic solution INSTILL 1 DROPS INTO EACH EYE TWICE DAILY  11  . folic acid (FOLVITE) 1 MG tablet Take 1 mg by mouth daily.    . hydrALAZINE (APRESOLINE) 100 MG tablet Take 100 mg by mouth 3 (three) times daily.    . insulin aspart protamine- aspart (NOVOLOG MIX 70/30) (70-30) 100 UNIT/ML injection Inject 0.25 mLs (25 Units total) into the skin 2 (two) times daily with a meal. (Patient taking differently: Inject 25-28 Units into the skin 2 (two) times  daily with a meal. 28 units in morning and 25 units at night) 10 mL 11  . lactulose (CEPHULAC) 10 g packet Take 1 packet (10 g total) by mouth every morning. 30 each 0  . LORazepam (ATIVAN) 1 MG tablet Take 1 mg by mouth 2 (two) times daily as needed. for anxiety  3  . metoCLOPramide (REGLAN) 5 MG tablet Take 1 tablet (5 mg total) by mouth 2 (two) times daily at 8 am and 10 pm. 60 tablet 3  . pantoprazole (PROTONIX) 40 MG tablet Take 1 tablet (40 mg total) by mouth 2 (two) times daily before a meal. 180 tablet 3  . potassium chloride SA (K-DUR,KLOR-CON) 20 MEQ tablet Take 1 tablet (20 mEq total) by mouth 2 (two) times daily. (Patient taking differently: Take 20 mEq by mouth daily. ) 20 tablet 0  . promethazine (PHENERGAN) 25 MG tablet Take 0.5 tablets (12.5 mg total) by mouth every 8 (eight) hours as needed for nausea or vomiting. To one tablet every 6 hours 40 tablet 1  . sitaGLIPtin (JANUVIA) 50 MG tablet Take 50 mg by mouth daily.    . sodium bicarbonate 650 MG tablet Take 1,300 mg by mouth 3 (three) times daily.    . sucralfate (CARAFATE) 1 g tablet Take 1 g by mouth 4 (four) times daily -  with meals and at bedtime.    . timolol (TIMOPTIC) 0.5 % ophthalmic solution Place 1 drop into both eyes 2 (two) times daily.    [name] torsemide (DEMADEX) 20 MG tablet Take 1 tablet (20 mg total) by mouth daily. (Patient taking differently: Take 40 mg by mouth daily. ) 30 tablet 0   No current facility-administered medications for this visit.      Past Surgical History:  Procedure Laterality Date  . CATARACT EXTRACTION W/PHACO Left 05/19/2016   Procedure: CATARACT EXTRACTION PHACO AND INTRAOCULAR LENS PLACEMENT LEFT EYE;  Surgeon: Tonny , MD;  Location: [facility];  Service: Ophthalmology;  Laterality: Left;  CDE: 7.30  . CATARACT EXTRACTION W/PHACO Right 06/23/2016   Procedure: CATARACT EXTRACTION PHACO AND INTRAOCULAR LENS PLACEMENT RIGHT EYE CDE=9.87;  Surgeon: Tonny , MD;  Location: [facility];  Service:  Ophthalmology;  Laterality: Right;  right  . ESOPHAGOGASTRODUODENOSCOPY  2015   Dr. Britta Mccreedy  . ESOPHAGOGASTRODUODENOSCOPY N/A 10/26/2014   RMR: Distal esophagititis-likely reflux related although an element of pill induced injuruy no exclueded  status post biopsy. Diffusely abnormal gastric mucosa of uncertain signigicane -status post gastric biopsy. Focal area of excoriation in the cardia most consistant with a trauma of heaving.   . ESOPHAGOGASTRODUODENOSCOPY  08/2015   [facility]: mild esophagitis and old gastric contents  . EYE SURGERY  2015   stent placed to left eye  .  EYE SURGERY    . INCISION AND DRAINAGE OF WOUND Right 07/16/2017   Procedure: IRRIGATION AND DEBRIDEMENT OF SOFT TISSUE OF ULCERATION RIGHT FOOT;  Surgeon: Caprice Beaver, DPM;  Location: [facility];  Service: Podiatry;  Laterality: Right;     Allergies  Allergen Reactions  . Donnatal [Pb-Hyoscy-Atropine-Scopolamine] Anaphylaxis    Reaction to GI cocktail  . Fish Allergy Anaphylaxis, Hives and Rash  . Haldol [Haloperidol Lactate] Other (See Comments)    Chest Pain  . Lidocaine Anaphylaxis    Reaction to GI cocktail  . Maalox [Calcium Carbonate Antacid] Anaphylaxis    Reaction to GI cocktail  . Marinol [Dronabinol]     Caused worsening vomiting.   . Aspirin Hives and Itching  . Bactrim [Sulfamethoxazole-Trimethoprim] Itching  . Doxycycline Hives and Nausea And Vomiting    Severe   . Keflex [Cephalexin] Hives, Itching and Nausea And Vomiting  . Penicillins Hives and Itching    Has patient had a PCN reaction causing immediate rash, facial/tongue/throat swelling, SOB or lightheadedness with hypotension: yes Has patient had a PCN reaction causing severe rash involving mucus membranes or skin necrosis: yes Has patient had a PCN reaction that required hospitalization no Has patient had a PCN reaction occurring within the last 10 years: yes If all of the above answers are "NO", then may proceed with Cephalospor       Family History  Problem Relation Age of Onset  . Ovarian cancer Mother   . Heart attack Father   . Cervical cancer Sister   . Diabetes Sister   . Colon cancer Neg Hx      Social History Timothy Clark reports that he has never smoked. He has never used smokeless tobacco. Timothy Clark reports no history of alcohol use.   Review of Systems CONSTITUTIONAL: No weight loss, fever, chills, weakness or fatigue.  HEENT: Eyes: No visual loss, blurred vision, double vision or yellow sclerae.No hearing loss, sneezing, congestion, runny nose or sore throat.  SKIN: No rash or itching.  CARDIOVASCULAR: per hpi RESPIRATORY: No shortness of breath, cough or sputum.  GASTROINTESTINAL: No anorexia, nausea, vomiting or diarrhea. No abdominal pain or blood.  GENITOURINARY: No burning on urination, no polyuria NEUROLOGICAL: No headache, dizziness, syncope, paralysis, ataxia, numbness or tingling in the extremities. No change in bowel or bladder control.  MUSCULOSKELETAL: No muscle, back pain, joint pain or stiffness.  LYMPHATICS: No enlarged nodes. No history of splenectomy.  PSYCHIATRIC: No history of depression or anxiety.  ENDOCRINOLOGIC: No reports of sweating, cold or heat intolerance. No polyuria or polydipsia.  [name]   Physical Examination Today's Vitals   02/23/19 1341  BP: (!) 165/77  Pulse: 68  Temp: 99.3 F (37.4 C)  SpO2: 100%  Weight: (!) 304 lb (137.9 kg)  Height: 6' (1.829 m)   Body mass index is 41.23 kg/m.  Gen: resting comfortably, no acute distress HEENT: no scleral icterus, pupils equal round and reactive, no palptable cervical adenopathy,  CV Resp: Clear to auscultation bilaterally GI: abdomen is soft, non-tender, non-distended, normal bowel sounds, no hepatosplenomegaly MSK: extremities are warm, no edema.  Skin: warm, no rash Neuro:  no focal deficits Psych: appropriate affect     Assessment and Plan  1. Preoperative evaluation - being considered for vascular  surgery, fistula placement for his advanced kidney disease - tolerates greater than 4 METs without significant limitations. No recent cardioupulmonary symptoms - recommend proceeding with surgery as planned  2. HTN - elevated in clinic - defer management to  his nephrologist in setting of advanced kidney disease and  who knows his history better than my self  3. Hyperlipidemia - request pcp labs - continue statin   4. Diastolic dysfunction - fluid management per nephrology, he is on torsemide. Monitored closely for possibly HD needs in near future   F/u 6 months       Arnoldo Lenis, M.D

## 2019-02-28 DIAGNOSIS — E118 Type 2 diabetes mellitus with unspecified complications: Secondary | ICD-10-CM | POA: Diagnosis not present

## 2019-02-28 DIAGNOSIS — E1122 Type 2 diabetes mellitus with diabetic chronic kidney disease: Secondary | ICD-10-CM | POA: Diagnosis not present

## 2019-02-28 DIAGNOSIS — N183 Chronic kidney disease, stage 3 (moderate): Secondary | ICD-10-CM | POA: Diagnosis not present

## 2019-02-28 DIAGNOSIS — I509 Heart failure, unspecified: Secondary | ICD-10-CM | POA: Diagnosis not present

## 2019-03-02 DIAGNOSIS — N184 Chronic kidney disease, stage 4 (severe): Secondary | ICD-10-CM | POA: Diagnosis not present

## 2019-03-02 DIAGNOSIS — E1122 Type 2 diabetes mellitus with diabetic chronic kidney disease: Secondary | ICD-10-CM | POA: Diagnosis not present

## 2019-03-02 DIAGNOSIS — E1151 Type 2 diabetes mellitus with diabetic peripheral angiopathy without gangrene: Secondary | ICD-10-CM | POA: Diagnosis not present

## 2019-03-02 DIAGNOSIS — E1143 Type 2 diabetes mellitus with diabetic autonomic (poly)neuropathy: Secondary | ICD-10-CM | POA: Diagnosis not present

## 2019-03-02 DIAGNOSIS — I5032 Chronic diastolic (congestive) heart failure: Secondary | ICD-10-CM | POA: Diagnosis not present

## 2019-03-02 DIAGNOSIS — I130 Hypertensive heart and chronic kidney disease with heart failure and stage 1 through stage 4 chronic kidney disease, or unspecified chronic kidney disease: Secondary | ICD-10-CM | POA: Diagnosis not present

## 2019-03-08 DIAGNOSIS — E1151 Type 2 diabetes mellitus with diabetic peripheral angiopathy without gangrene: Secondary | ICD-10-CM | POA: Diagnosis not present

## 2019-03-08 DIAGNOSIS — I130 Hypertensive heart and chronic kidney disease with heart failure and stage 1 through stage 4 chronic kidney disease, or unspecified chronic kidney disease: Secondary | ICD-10-CM | POA: Diagnosis not present

## 2019-03-08 DIAGNOSIS — E1143 Type 2 diabetes mellitus with diabetic autonomic (poly)neuropathy: Secondary | ICD-10-CM | POA: Diagnosis not present

## 2019-03-08 DIAGNOSIS — N184 Chronic kidney disease, stage 4 (severe): Secondary | ICD-10-CM | POA: Diagnosis not present

## 2019-03-08 DIAGNOSIS — I5032 Chronic diastolic (congestive) heart failure: Secondary | ICD-10-CM | POA: Diagnosis not present

## 2019-03-08 DIAGNOSIS — E1122 Type 2 diabetes mellitus with diabetic chronic kidney disease: Secondary | ICD-10-CM | POA: Diagnosis not present

## 2019-03-10 DIAGNOSIS — H4720 Unspecified optic atrophy: Secondary | ICD-10-CM | POA: Diagnosis not present

## 2019-03-10 DIAGNOSIS — E113592 Type 2 diabetes mellitus with proliferative diabetic retinopathy without macular edema, left eye: Secondary | ICD-10-CM | POA: Diagnosis not present

## 2019-03-10 DIAGNOSIS — H35372 Puckering of macula, left eye: Secondary | ICD-10-CM | POA: Diagnosis not present

## 2019-03-10 DIAGNOSIS — H4312 Vitreous hemorrhage, left eye: Secondary | ICD-10-CM | POA: Diagnosis not present

## 2019-03-14 ENCOUNTER — Telehealth (INDEPENDENT_AMBULATORY_CARE_PROVIDER_SITE_OTHER): Payer: Self-pay

## 2019-03-14 NOTE — Telephone Encounter (Signed)
Spoke with the patient and discussed his surgery and pre-op. Patient is schedule with Dr. Delana Meyer for surgery on 04/01/2019 and pre-op and Covid testing on 03/29/2019 with a 8:00 am arrival time. This information will be mailed out to the patient.

## 2019-03-14 NOTE — Progress Notes (Signed)
Primary Care Physician:  [name], MD Primary GI Physician: Dr. Gala Romney  Chief Complaint  Patient presents with  . gastroparesis  . Nausea    with vomiting yesterday  . Abdominal Pain    upper abd every day    HPI:   Timothy Clark is a 48 y.o. male presenting today with a history of chronic abdominal pain, GERD, esophagitis, gastritis, suspected gastroparesis, recurrent nausea and vomiting, h. Pylori s/p eradication in 2016, T2DM, CKD, and diastolic dysfunction. GES in 06/15/15 with rapid gastric transit. [facility] questioned dumping syndrome, but EGD at [facility] Jan 2017 with mild esophagitis and old gastric contents. Felt clinically consistent with gastroparesis. RUQ ultrasound March 2019: normal.CT Nov 2019 with fatty liver, gallbladder sludge. No prior colonoscopy.   Patient was last seen by our office on 12/08/18. He continued to have nausea and slight epigastric pain every morning that resolved around lunchtime. However, also reporting some postprandial epigastric pain with fried foods. Was taking Reglan once daily due to diarrhea with BID. Recommended increase Reglan to BID, stop lactulose, and update HIDA. HIDA on 01/28/19: Normal. No symptoms with CCK administration.   Today he states, he continues to have epigastric pain 4/10 every morning. Aching and constant. Around 11 am it eases off. Not worsening. About the same for the last 2-3 years. He had nausea the last 2 mornings and 1 episode of vomiting yesterday. No hematemesis or coffee ground emesis. Prior to this, he had not had any nausea for over 1 month no vomiting since April. States his blood pressure has been elevated the last 2 mornings and wonders if this was causing the nausea. This morning 170/90. Yesterday 182/92. After taking his am medication, his blood pressure will improve within a couple hours. Using phenergan as needed, which helps. Allergic to Zofran. Bentyl if epigastric pain is bad. Helps some. Dropped Reglan back  to once a day. Tried twice a day for 2 months. Was taking one in the morning and one at lunch. Caused diarrhea. No weight loss. Appetite is good.  No breakthrough GERD symptoms. Protonix works well. Tried Dexilant in the past, reports it didn't do well. Thinks he had EGD at [facility] after EGD at [facility].    No prior colonoscopy. Constipation occasionally. 1-2 times a week. Straining with hard stool. Takes nothing. No hematochezia or melena. No lower abdominal pain. Patient prefers waiting to schedule colonoscopy until next visit as he has a lot going on right now. States there is no way he can schedule this now.   About to have fistula placed later this month to start dialysis.    Past Medical History:  Diagnosis Date  . CHF (congestive heart failure) ([place])   . Chronic abdominal pain   . Esophagitis   . Eye hemorrhage   . Gastritis   . Gastroparesis   . GERD (gastroesophageal reflux disease)   . Glaucoma   . Helicobacter pylori (H. pylori) infection 2016   ERADICATION DOCUMENTED VIA STOOL TEST in AUG 2016 at [place]  . Hiccups   . Hypertension   . Nausea and vomiting    chronic, recurrent  . Renal insufficiency   . Type 2 diabetes mellitus with complications (HCC)    diagnosed around age 32    Past Surgical History:  Procedure Laterality Date  . CATARACT EXTRACTION W/PHACO Left 05/19/2016   Procedure: CATARACT EXTRACTION PHACO AND INTRAOCULAR LENS PLACEMENT LEFT EYE;  Surgeon: Tonny Branch, MD;  Location: [facility];  Service: Ophthalmology;  Laterality: Left;  CDE: 7.30  . CATARACT EXTRACTION W/PHACO Right 06/23/2016   Procedure: CATARACT EXTRACTION PHACO AND INTRAOCULAR LENS PLACEMENT RIGHT EYE CDE=9.87;  Surgeon: Tonny Branch, MD;  Location: [facility];  Service: Ophthalmology;  Laterality: Right;  right  . ESOPHAGOGASTRODUODENOSCOPY  2015   Dr. Britta Mccreedy  . ESOPHAGOGASTRODUODENOSCOPY N/A 10/26/2014   RMR: Distal esophagititis-likely reflux related although an element of pill induced injuruy  no exclueded  status post biopsy. Diffusely abnormal gastric mucosa of uncertain signigicane -status post gastric biopsy. Focal area of excoriation in the cardia most consistant with a trauma of heaving.   . ESOPHAGOGASTRODUODENOSCOPY  08/2015   [facility]: mild esophagitis and old gastric contents  . EYE SURGERY  2015   stent placed to left eye  . EYE SURGERY    . INCISION AND DRAINAGE OF WOUND Right 07/16/2017   Procedure: IRRIGATION AND DEBRIDEMENT OF SOFT TISSUE OF ULCERATION RIGHT FOOT;  Surgeon: Caprice Beaver, DPM;  Location: [facility];  Service: Podiatry;  Laterality: Right;    Current Outpatient Medications  Medication Sig Dispense Refill  . amLODipine (NORVASC) 5 MG tablet Take 5 mg by mouth daily.    [name] atorvastatin (LIPITOR) 10 MG tablet Take 10 mg by mouth at bedtime.     . Blood Glucose Monitoring Suppl (PRODIGY AUTOCODE BLOOD GLUCOSE) w/Device KIT     . brimonidine (ALPHAGAN) 0.2 % ophthalmic solution Place 1 drop into both eyes 3 (three) times daily.     . carvedilol (COREG) 25 MG tablet Take 25 mg by mouth 2 (two) times daily with a meal.    . cloNIDine (CATAPRES) 0.2 MG tablet Take 0.2 mg by mouth 3 (three) times daily.     . cyanocobalamin (,VITAMIN B-12,) 1000 MCG/ML injection INJECT 1 ML (CC) INTRAMUSCULARLY ONCE A WEEK FOR 4 WEEKS THEN MONTHLY  1  . dicyclomine (BENTYL) 10 MG capsule Take 1 capsule by mouth as needed.    . dorzolamide (TRUSOPT) 2 % ophthalmic solution INSTILL 1 DROPS INTO EACH EYE TWICE DAILY  11  . folic acid (FOLVITE) 1 MG tablet Take 1 mg by mouth daily.    . hydrALAZINE (APRESOLINE) 100 MG tablet Take 100 mg by mouth 3 (three) times daily.    . insulin aspart protamine- aspart (NOVOLOG MIX 70/30) (70-30) 100 UNIT/ML injection Inject 0.25 mLs (25 Units total) into the skin 2 (two) times daily with a meal. (Patient taking differently: Inject 25-28 Units into the skin 2 (two) times daily with a meal. 28 units in morning and 25 units at night) 10 mL 11  .  LORazepam (ATIVAN) 1 MG tablet Take 1 mg by mouth 2 (two) times daily as needed. for anxiety  3  . metoCLOPramide (REGLAN) 5 MG tablet Take 1 tablet (5 mg total) by mouth 2 (two) times daily at 8 am and 10 pm. 60 tablet 3  . pantoprazole (PROTONIX) 40 MG tablet Take 1 tablet (40 mg total) by mouth 2 (two) times daily before a meal. 180 tablet 3  . potassium chloride SA (K-DUR) 20 MEQ tablet Take 20 mEq by mouth daily.    . promethazine (PHENERGAN) 25 MG tablet Take 12.5 mg by mouth as needed for nausea or vomiting.    . sitaGLIPtin (JANUVIA) 50 MG tablet Take 50 mg by mouth daily.    . sucralfate (CARAFATE) 1 g tablet Take 1 g by mouth 4 (four) times daily -  with meals and at bedtime.    . timolol (TIMOPTIC) 0.5 % ophthalmic  solution Place 1 drop into both eyes 2 (two) times daily.    [name] torsemide (DEMADEX) 20 MG tablet Take 40 mg by mouth daily.     No current facility-administered medications for this visit.     Allergies as of 03/15/2019 - Review Complete 03/15/2019  Allergen Reaction Noted  . Donnatal [pb-hyoscy-atropine-scopolamine] Anaphylaxis 07/12/2015  . Fish allergy Anaphylaxis, Hives, and Rash 01/14/2015  . Haldol [haloperidol lactate] Other (See Comments) 08/07/2015  . Lidocaine Anaphylaxis 07/12/2015  . Maalox [calcium carbonate antacid] Anaphylaxis 07/12/2015  . Marinol [dronabinol]  05/28/2018  . Aspirin Hives and Itching 03/22/2009  . Bactrim [sulfamethoxazole-trimethoprim] Itching 12/06/2014  . Doxycycline Hives and Nausea And Vomiting 03/20/2014  . Keflex [cephalexin] Hives, Itching, and Nausea And Vomiting 06/22/2017  . Penicillins Hives and Itching 03/22/2009    Family History  Problem Relation Age of Onset  . Ovarian cancer Mother   . Heart attack Father   . Cervical cancer Sister   . Diabetes Sister   . Colon cancer Neg Hx     Social History   Socioeconomic History  . Marital status: Single    Spouse name: Not on file  . Number of children: Not on file   . Years of education: Not on file  . Highest education level: Not on file  Occupational History  . Not on file  Social Needs  . Financial resource strain: Not on file  . Food insecurity    Worry: Not on file    Inability: Not on file  . Transportation needs    Medical: Not on file    Non-medical: Not on file  Tobacco Use  . Smoking status: Never Smoker  . Smokeless tobacco: Never Used  Substance and Sexual Activity  . Alcohol use: No    Alcohol/week: 0.0 standard drinks  . Drug use: No  . Sexual activity: Yes  Lifestyle  . Physical activity    Days per week: Not on file    Minutes per session: Not on file  . Stress: Not on file  Relationships  . Social Herbalist on phone: Not on file    Gets together: Not on file    Attends religious service: Not on file    Active member of club or organization: Not on file    Attends meetings of clubs or organizations: Not on file    Relationship status: Not on file  Other Topics Concern  . Not on file  Social History Narrative  . Not on file    Review of Systems: Gen: Denies fever. Reports staying cold due to history of anemia. Denies fatigue, lightheadedness, dizziness.   CV: Denies chest pain, palpitations, syncope. Admits to peripheral edema Resp: Denies dyspnea at rest, cough, wheezing GI: See HPI Derm: Denies rash, itching. States his skin is oily when sweating.  Psych: Denies depression, anxiety Heme: Denies bruising, bleeding  Physical Exam: BP 134/70   Pulse 74   Temp (!) 97.3 F (36.3 C) (Oral)   Ht '6\' 5"'  (1.956 m)   Wt (!) 305 lb 9.6 oz (138.6 kg)   BMI 36.24 kg/m  General:   Alert and oriented. No distress noted. Pleasant and cooperative.  Head:  Normocephalic and atraumatic. Eyes:  Conjuctiva clear without scleral icterus. Heart:  S1, S2 present without murmurs appreciated. Lungs:  Clear to auscultation bilaterally. No wheezes, rales, or rhonchi. No distress.  Abdomen:  +BS, soft, non-tender and  non-distended. No rebound or guarding. No HSM or masses  noted. Msk:  Symmetrical without gross deformities. Normal posture. Extremities:  With 3+ LE edema up to the knees. Patient states this is his baseline.  Neurologic:  Alert and  oriented x4 Psych: Normal mood and affect.

## 2019-03-15 ENCOUNTER — Encounter: Payer: Self-pay | Admitting: Gastroenterology

## 2019-03-15 ENCOUNTER — Other Ambulatory Visit: Payer: Self-pay

## 2019-03-15 ENCOUNTER — Ambulatory Visit: Payer: Medicare HMO | Admitting: Gastroenterology

## 2019-03-15 VITALS — BP 134/70 | HR 74 | Temp 97.3°F | Ht 77.0 in | Wt 305.6 lb

## 2019-03-15 DIAGNOSIS — E1143 Type 2 diabetes mellitus with diabetic autonomic (poly)neuropathy: Secondary | ICD-10-CM | POA: Diagnosis not present

## 2019-03-15 DIAGNOSIS — I5032 Chronic diastolic (congestive) heart failure: Secondary | ICD-10-CM | POA: Diagnosis not present

## 2019-03-15 DIAGNOSIS — K219 Gastro-esophageal reflux disease without esophagitis: Secondary | ICD-10-CM | POA: Diagnosis not present

## 2019-03-15 DIAGNOSIS — K3184 Gastroparesis: Secondary | ICD-10-CM

## 2019-03-15 DIAGNOSIS — K5900 Constipation, unspecified: Secondary | ICD-10-CM

## 2019-03-15 DIAGNOSIS — N184 Chronic kidney disease, stage 4 (severe): Secondary | ICD-10-CM | POA: Diagnosis not present

## 2019-03-15 DIAGNOSIS — E1151 Type 2 diabetes mellitus with diabetic peripheral angiopathy without gangrene: Secondary | ICD-10-CM | POA: Diagnosis not present

## 2019-03-15 DIAGNOSIS — E1122 Type 2 diabetes mellitus with diabetic chronic kidney disease: Secondary | ICD-10-CM | POA: Diagnosis not present

## 2019-03-15 DIAGNOSIS — I130 Hypertensive heart and chronic kidney disease with heart failure and stage 1 through stage 4 chronic kidney disease, or unspecified chronic kidney disease: Secondary | ICD-10-CM | POA: Diagnosis not present

## 2019-03-15 MED ORDER — METOCLOPRAMIDE HCL 5 MG PO TABS: 5.0000 mg | ORAL_TABLET | Freq: Two times a day (BID) | ORAL | 3 refills | Status: DC

## 2019-03-15 NOTE — Assessment & Plan Note (Signed)
Patient reporting BMs every 2-3 days. Often hard and with straining. No hematochezia or melena. No lower abdominal pain. No prior colonoscopy. Patient is overdue at this time for screening, but request that we hold off at this time and discuss at next visit.   Add daily fiber supplement. Benefiber or Metamucil May add MiraLAX 1 capful with 8 oz of water as needed. I have asked patient to increase Reglan, which historically has caused diarrhea, so patient may not need MiraLAX.  Follow-up in 3 months.

## 2019-03-15 NOTE — Assessment & Plan Note (Signed)
History of GERD. Symptoms well controlled on Protonix 40 mg BID. No breakthrough symptoms. No dysphagia. He continues to have chronic epigastric pain 4/10 every morning that eases off by 11 am. No melena or hematochezia. No unintentional weight loss. Last EGD on file in 2017 at [facility] with mild esophagitis and old gastric contents. Patient thinks he had another EGD since at [facility]. Will request these records. Discussed changing PPI, but patient would rather continue on Protonix as he says this is doing really well for him. States Dexilant didn't help in the past. I suspect chronic abdominal pain is related to underlying gastroparesis as this did improve with Reglan BID. No need for EGD at this time as epigastric pain is chronic and not associated with other alarm symptoms at this time.   Continue Protonix 40 mg BID Request EGD records from [place].

## 2019-03-15 NOTE — Patient Instructions (Addendum)
Please start taking Reglan before breakfast and before dinner. If this causes diarrhea, please call us. We may be able to adjust timing a little differently.   You can add daily fiber supplement for constipation. Benefiber or metamucil works well. Start with the low dose and increase slowly.   You may use miralax 1 capful with 8 oz of water as needed for constipation. You may not need this with increasing Reglan.   Continue Bentyl and phenergan as needed.  Follow-up with PCP on elevated blood pressure to see if they need to adjust timing of medications.   We will see you back in 3 months.   Aliene Altes, PA-C [facility]

## 2019-03-15 NOTE — Assessment & Plan Note (Addendum)
48 y.o. male with past medical history of gastroparesis. GES 06/15/15 with rapid gastric transit. [facility] questioned dumping syndrome, but EGD at [facility] Jan 2017 with mild esophagitis and old gastric contents. Felt clinically consistent with gastroparesis. Symptoms are at baseline at this time. Continues to have epigastric pain 4/10 in the morning that eases off by 11 am. Tried increasing Reglan after last visit in April. Was taking before breakfast and before lunch. Helped with epigastric pain and nausea, but caused diarrhea. He is now back to taking once daily. No recent nausea except for the last two days which he notes his blood pressure has been very elevated the last 2 morning, 182/92 yesterday and 170/90 today. This gets back within normal range a few hours after taking BP medication. Vomited once yesterday. None prior to that since April. No hematemesis. No weight loss. Appetite is good.   Try increasing Reglan to twice daily before breakfast and before dinner rather than breakfast and lunch. If this causes diarrhea, patient to call.  Eat small meals throughout the day.  Do not eat a large meal for dinner. Continue Bentyl and Phenergan as needed. Advised he follow-up with PCP on am blood pressure elevation.   Follow-up in 3 months.

## 2019-03-17 NOTE — Progress Notes (Signed)
cc'd to pcp 

## 2019-03-18 DIAGNOSIS — E1122 Type 2 diabetes mellitus with diabetic chronic kidney disease: Secondary | ICD-10-CM | POA: Diagnosis not present

## 2019-03-18 DIAGNOSIS — E1143 Type 2 diabetes mellitus with diabetic autonomic (poly)neuropathy: Secondary | ICD-10-CM | POA: Diagnosis not present

## 2019-03-18 DIAGNOSIS — E785 Hyperlipidemia, unspecified: Secondary | ICD-10-CM | POA: Diagnosis not present

## 2019-03-18 DIAGNOSIS — E11319 Type 2 diabetes mellitus with unspecified diabetic retinopathy without macular edema: Secondary | ICD-10-CM | POA: Diagnosis not present

## 2019-03-18 DIAGNOSIS — E1151 Type 2 diabetes mellitus with diabetic peripheral angiopathy without gangrene: Secondary | ICD-10-CM | POA: Diagnosis not present

## 2019-03-18 DIAGNOSIS — D509 Iron deficiency anemia, unspecified: Secondary | ICD-10-CM | POA: Diagnosis not present

## 2019-03-18 DIAGNOSIS — D519 Vitamin B12 deficiency anemia, unspecified: Secondary | ICD-10-CM | POA: Diagnosis not present

## 2019-03-18 DIAGNOSIS — I10 Essential (primary) hypertension: Secondary | ICD-10-CM | POA: Diagnosis not present

## 2019-03-21 DIAGNOSIS — N183 Chronic kidney disease, stage 3 (moderate): Secondary | ICD-10-CM | POA: Diagnosis not present

## 2019-03-21 DIAGNOSIS — K3184 Gastroparesis: Secondary | ICD-10-CM | POA: Diagnosis not present

## 2019-03-21 DIAGNOSIS — E785 Hyperlipidemia, unspecified: Secondary | ICD-10-CM | POA: Diagnosis not present

## 2019-03-21 DIAGNOSIS — D631 Anemia in chronic kidney disease: Secondary | ICD-10-CM | POA: Diagnosis not present

## 2019-03-21 DIAGNOSIS — I129 Hypertensive chronic kidney disease with stage 1 through stage 4 chronic kidney disease, or unspecified chronic kidney disease: Secondary | ICD-10-CM | POA: Diagnosis not present

## 2019-03-21 DIAGNOSIS — H401190 Primary open-angle glaucoma, unspecified eye, stage unspecified: Secondary | ICD-10-CM | POA: Diagnosis not present

## 2019-03-21 DIAGNOSIS — E1122 Type 2 diabetes mellitus with diabetic chronic kidney disease: Secondary | ICD-10-CM | POA: Diagnosis not present

## 2019-03-21 DIAGNOSIS — D519 Vitamin B12 deficiency anemia, unspecified: Secondary | ICD-10-CM | POA: Diagnosis not present

## 2019-03-23 DIAGNOSIS — N184 Chronic kidney disease, stage 4 (severe): Secondary | ICD-10-CM | POA: Diagnosis not present

## 2019-03-23 DIAGNOSIS — E1151 Type 2 diabetes mellitus with diabetic peripheral angiopathy without gangrene: Secondary | ICD-10-CM | POA: Diagnosis not present

## 2019-03-23 DIAGNOSIS — I130 Hypertensive heart and chronic kidney disease with heart failure and stage 1 through stage 4 chronic kidney disease, or unspecified chronic kidney disease: Secondary | ICD-10-CM | POA: Diagnosis not present

## 2019-03-23 DIAGNOSIS — E1122 Type 2 diabetes mellitus with diabetic chronic kidney disease: Secondary | ICD-10-CM | POA: Diagnosis not present

## 2019-03-23 DIAGNOSIS — I5032 Chronic diastolic (congestive) heart failure: Secondary | ICD-10-CM | POA: Diagnosis not present

## 2019-03-23 DIAGNOSIS — E1143 Type 2 diabetes mellitus with diabetic autonomic (poly)neuropathy: Secondary | ICD-10-CM | POA: Diagnosis not present

## 2019-03-24 DIAGNOSIS — N184 Chronic kidney disease, stage 4 (severe): Secondary | ICD-10-CM | POA: Diagnosis not present

## 2019-03-24 DIAGNOSIS — I129 Hypertensive chronic kidney disease with stage 1 through stage 4 chronic kidney disease, or unspecified chronic kidney disease: Secondary | ICD-10-CM | POA: Diagnosis not present

## 2019-03-24 DIAGNOSIS — R609 Edema, unspecified: Secondary | ICD-10-CM | POA: Diagnosis not present

## 2019-03-24 DIAGNOSIS — E1129 Type 2 diabetes mellitus with other diabetic kidney complication: Secondary | ICD-10-CM | POA: Diagnosis not present

## 2019-03-25 ENCOUNTER — Other Ambulatory Visit (INDEPENDENT_AMBULATORY_CARE_PROVIDER_SITE_OTHER): Payer: Self-pay | Admitting: Nurse Practitioner

## 2019-03-29 ENCOUNTER — Encounter
Admission: RE | Admit: 2019-03-29 | Discharge: 2019-03-29 | Disposition: A | Discharge status: Home / Self Care | Payer: Medicare HMO | Source: Ambulatory Visit | Attending: Vascular Surgery | Admitting: Vascular Surgery

## 2019-03-29 ENCOUNTER — Encounter (INDEPENDENT_AMBULATORY_CARE_PROVIDER_SITE_OTHER): Payer: Self-pay

## 2019-03-29 ENCOUNTER — Other Ambulatory Visit: Payer: Self-pay

## 2019-03-29 DIAGNOSIS — R9431 Abnormal electrocardiogram [ECG] [EKG]: Secondary | ICD-10-CM | POA: Diagnosis not present

## 2019-03-29 DIAGNOSIS — Z20828 Contact with and (suspected) exposure to other viral communicable diseases: Secondary | ICD-10-CM | POA: Diagnosis not present

## 2019-03-29 DIAGNOSIS — Z01818 Encounter for other preprocedural examination: Secondary | ICD-10-CM | POA: Diagnosis not present

## 2019-03-29 HISTORY — DX: Polyneuropathy, unspecified: G62.9

## 2019-03-29 HISTORY — DX: Depression, unspecified: F32.A

## 2019-03-29 HISTORY — DX: Anxiety disorder, unspecified: F41.9

## 2019-03-29 LAB — APTT: aPTT: 43 seconds — ABNORMAL HIGH (ref 24–36)

## 2019-03-29 LAB — BASIC METABOLIC PANEL
Anion gap: 9 (ref 5–15)
BUN: 84 mg/dL — ABNORMAL HIGH (ref 6–20)
CO2: 20 mmol/L — ABNORMAL LOW (ref 22–32)
Calcium: 8.7 mg/dL — ABNORMAL LOW (ref 8.9–10.3)
Chloride: 105 mmol/L (ref 98–111)
Creatinine, Ser: 7.39 mg/dL — ABNORMAL HIGH (ref 0.61–1.24)
GFR calc Af Amer: 9 mL/min — ABNORMAL LOW (ref >60)
GFR calc non Af Amer: 8 mL/min — ABNORMAL LOW (ref >60)
Glucose, Bld: 193 mg/dL — ABNORMAL HIGH (ref 70–99)
Potassium: 4.3 mmol/L (ref 3.5–5.1)
Sodium: 134 mmol/L — ABNORMAL LOW (ref 135–145)

## 2019-03-29 LAB — CBC WITH DIFFERENTIAL/PLATELET
Abs Immature Granulocytes: 0.01 10*3/uL (ref 0.00–0.07)
Basophils Absolute: 0 10*3/uL (ref 0.0–0.1)
Basophils Relative: 1 %
Eosinophils Absolute: 0.1 10*3/uL (ref 0.0–0.5)
Eosinophils Relative: 2 %
HCT: 22.6 % — ABNORMAL LOW (ref 39.0–52.0)
Hemoglobin: 7.6 g/dL — ABNORMAL LOW (ref 13.0–17.0)
Immature Granulocytes: 0 %
Lymphocytes Relative: 22 %
Lymphs Abs: 1.2 10*3/uL (ref 0.7–4.0)
MCH: 31 pg (ref 26.0–34.0)
MCHC: 33.6 g/dL (ref 30.0–36.0)
MCV: 92.2 fL (ref 80.0–100.0)
Monocytes Absolute: 0.5 10*3/uL (ref 0.1–1.0)
Monocytes Relative: 9 %
Neutro Abs: 3.5 10*3/uL (ref 1.7–7.7)
Neutrophils Relative %: 66 %
Platelets: 105 10*3/uL — ABNORMAL LOW (ref 150–400)
RBC: 2.45 MIL/uL — ABNORMAL LOW (ref 4.22–5.81)
RDW: 13.2 % (ref 11.5–15.5)
WBC: 5.3 10*3/uL (ref 4.0–10.5)
nRBC: 0 % (ref 0.0–0.2)

## 2019-03-29 LAB — PROTIME-INR
INR: 1.2 (ref 0.8–1.2)
Prothrombin Time: 15.1 seconds (ref 11.4–15.2)

## 2019-03-29 LAB — TYPE AND SCREEN
ABO/RH(D): O POS
Antibody Screen: NEGATIVE
Extend sample reason: TRANSFUSED

## 2019-03-29 MED ORDER — CHLORHEXIDINE GLUCONATE CLOTH 2 % EX PADS
6.0000 | MEDICATED_PAD | Freq: Once | CUTANEOUS | Status: DC
  Filled 2019-03-29: qty 6

## 2019-03-29 NOTE — Patient Instructions (Signed)
Your procedure is scheduled on: 04/06/2019 Wed Report to Same Day Surgery 2nd floor medical mall [facility] Entrance-take elevator on left to 2nd floor.  Check in with surgery information desk.) To find out your arrival time please call [phone] between 1PM - 3PM on 04/05/2019 Tues  Remember: Instructions that are not followed completely may result in serious medical risk, up to and including death, or upon the discretion of your surgeon and anesthesiologist your surgery may need to be rescheduled.    _x___ 1. Do not eat food after midnight the night before your procedure. You may drink clear liquids up to 2 hours before you are scheduled to arrive at the hospital for your procedure.  Do not drink clear liquids within 2 hours of your scheduled arrival to the hospital.  Clear liquids include  --Water or Apple juice without pulp  --Clear carbohydrate beverage such as ClearFast or Gatorade  --Black Coffee or Clear Tea (No milk, no creamers, do not add anything to                  the coffee or Tea Type 1 and type 2 diabetics should only drink water.   ____Ensure clear carbohydrate drink on the way to the hospital for bariatric patients  ____Ensure clear carbohydrate drink 3 hours before surgery.   No gum chewing or hard candies.     __x__ 2. No Alcohol for 24 hours before or after surgery.   __x__3. No Smoking or e-cigarettes for 24 prior to surgery.  Do not use any chewable tobacco products for at least 6 hour prior to surgery   ____  4. Bring all medications with you on the day of surgery if instructed.    __x__ 5. Notify your doctor if there is any change in your medical condition     (cold, fever, infections).    x___6. On the morning of surgery brush your teeth with toothpaste and water.  You may rinse your mouth with mouth wash if you wish.  Do not swallow any toothpaste or mouthwash.   Do not wear jewelry, make-up, hairpins, clips or nail polish.  Do not wear lotions,  powders, or perfumes. You may wear deodorant.  Do not shave 48 hours prior to surgery. Men may shave face and neck.  Do not bring valuables to the hospital.    [facility] is not responsible for any belongings or valuables.               Contacts, dentures or bridgework may not be worn into surgery.  Leave your suitcase in the car. After surgery it may be brought to your room.  For patients admitted to the hospital, discharge time is determined by your                       treatment team.  _  Patients discharged the day of surgery will not be allowed to drive home.  You will need someone to drive you home and stay with you the night of your procedure.    Please read over the following fact sheets that you were given:   [facility] Preparing for Surgery and or MRSA Information   _x___ Take anti-hypertensive listed below, cardiac, seizure, asthma,     anti-reflux and psychiatric medicines. These include:  1. brimonidine (ALPHAGAN) 0.15 % ophthalmic solution  2.amLODipine (NORVASC) 5 MG tablet  3.carvedilol (COREG) 25 MG t  4.cloNIDine (CATAPRES) 0.2 MG tablet  5.dorzolamide (  TRUSOPT) 2 % ophthalmic solution  6.hydrALAZINE (APRESOLINE) 100 MG tablet  7.pantoprazole (PROTONIX) 40 MG tablet  8prednisoLONE acetate (PRED FORTE) 1 % ophthalmic suspension  9.timolol (TIMOPTIC) 0.5 % ophthalmic solution  ____Fleets enema or Magnesium Citrate as directed.   _x___ Use CHG Soap or sage wipes as directed on instruction sheet   ____ Use inhalers on the day of surgery and bring to hospital day of surgery  ____ Stop Metformin and Janumet 2 days prior to surgery.    _x___ Take 1/2 of usual insulin dose the night before surgery and none on the morning     surgery.   _x___ Follow recommendations from Cardiologist, Pulmonologist or PCP regarding          stopping Aspirin, Coumadin, Plavix ,Eliquis, Effient, or Pradaxa, and Pletal.  X____Stop Anti-inflammatories such as Advil, Aleve, Ibuprofen,  Motrin, Naproxen, Naprosyn, Goodies powders or aspirin products. OK to take Tylenol and                          Celebrex.   _x___ Stop supplements until after surgery.  But may continue Vitamin D, Vitamin B,       and multivitamin.   ____ Bring C-Pap to the hospital.

## 2019-03-30 DIAGNOSIS — I5032 Chronic diastolic (congestive) heart failure: Secondary | ICD-10-CM | POA: Diagnosis not present

## 2019-03-30 DIAGNOSIS — I130 Hypertensive heart and chronic kidney disease with heart failure and stage 1 through stage 4 chronic kidney disease, or unspecified chronic kidney disease: Secondary | ICD-10-CM | POA: Diagnosis not present

## 2019-03-30 DIAGNOSIS — E1122 Type 2 diabetes mellitus with diabetic chronic kidney disease: Secondary | ICD-10-CM | POA: Diagnosis not present

## 2019-03-30 DIAGNOSIS — N184 Chronic kidney disease, stage 4 (severe): Secondary | ICD-10-CM | POA: Diagnosis not present

## 2019-03-30 DIAGNOSIS — E1143 Type 2 diabetes mellitus with diabetic autonomic (poly)neuropathy: Secondary | ICD-10-CM | POA: Diagnosis not present

## 2019-03-30 DIAGNOSIS — E1151 Type 2 diabetes mellitus with diabetic peripheral angiopathy without gangrene: Secondary | ICD-10-CM | POA: Diagnosis not present

## 2019-03-31 DIAGNOSIS — E118 Type 2 diabetes mellitus with unspecified complications: Secondary | ICD-10-CM | POA: Diagnosis not present

## 2019-03-31 DIAGNOSIS — I509 Heart failure, unspecified: Secondary | ICD-10-CM | POA: Diagnosis not present

## 2019-03-31 DIAGNOSIS — N183 Chronic kidney disease, stage 3 (moderate): Secondary | ICD-10-CM | POA: Diagnosis not present

## 2019-03-31 DIAGNOSIS — E1122 Type 2 diabetes mellitus with diabetic chronic kidney disease: Secondary | ICD-10-CM | POA: Diagnosis not present

## 2019-04-03 ENCOUNTER — Other Ambulatory Visit (INDEPENDENT_AMBULATORY_CARE_PROVIDER_SITE_OTHER): Payer: Self-pay | Admitting: Nurse Practitioner

## 2019-04-04 ENCOUNTER — Other Ambulatory Visit
Admission: RE | Admit: 2019-04-04 | Discharge: 2019-04-04 | Disposition: A | Discharge status: Home / Self Care | Payer: Medicare HMO | Source: Ambulatory Visit | Attending: Vascular Surgery | Admitting: Vascular Surgery

## 2019-04-04 ENCOUNTER — Other Ambulatory Visit: Payer: Self-pay

## 2019-04-04 DIAGNOSIS — Z20828 Contact with and (suspected) exposure to other viral communicable diseases: Secondary | ICD-10-CM | POA: Diagnosis not present

## 2019-04-04 DIAGNOSIS — Z01818 Encounter for other preprocedural examination: Secondary | ICD-10-CM | POA: Diagnosis not present

## 2019-04-04 DIAGNOSIS — R9431 Abnormal electrocardiogram [ECG] [EKG]: Secondary | ICD-10-CM | POA: Diagnosis not present

## 2019-04-04 LAB — SARS CORONAVIRUS 2 (TAT 6-24 HRS): SARS Coronavirus 2: NEGATIVE

## 2019-04-05 DIAGNOSIS — H35372 Puckering of macula, left eye: Secondary | ICD-10-CM | POA: Diagnosis not present

## 2019-04-05 DIAGNOSIS — E113512 Type 2 diabetes mellitus with proliferative diabetic retinopathy with macular edema, left eye: Secondary | ICD-10-CM | POA: Diagnosis not present

## 2019-04-05 DIAGNOSIS — H4312 Vitreous hemorrhage, left eye: Secondary | ICD-10-CM | POA: Diagnosis not present

## 2019-04-05 MED ORDER — CLINDAMYCIN PHOSPHATE 300 MG/50ML IV SOLN
300.0000 mg | INTRAVENOUS | Status: AC
  Administered 2019-04-06: 300 mg via INTRAVENOUS

## 2019-04-06 ENCOUNTER — Ambulatory Visit: Payer: Medicare HMO | Admitting: Certified Registered Nurse Anesthetist

## 2019-04-06 ENCOUNTER — Other Ambulatory Visit: Payer: Self-pay

## 2019-04-06 ENCOUNTER — Encounter
Admission: RE | Disposition: A | Discharge status: Home / Self Care | Payer: Self-pay | Source: Home / Self Care | Attending: Vascular Surgery

## 2019-04-06 ENCOUNTER — Ambulatory Visit
Admission: RE | Admit: 2019-04-06 | Discharge: 2019-04-06 | Disposition: A | Discharge status: Home / Self Care | Payer: Medicare HMO | Attending: Vascular Surgery | Admitting: Vascular Surgery

## 2019-04-06 DIAGNOSIS — Z888 Allergy status to other drugs, medicaments and biological substances status: Secondary | ICD-10-CM | POA: Diagnosis not present

## 2019-04-06 DIAGNOSIS — Z7984 Long term (current) use of oral hypoglycemic drugs: Secondary | ICD-10-CM | POA: Diagnosis not present

## 2019-04-06 DIAGNOSIS — E1143 Type 2 diabetes mellitus with diabetic autonomic (poly)neuropathy: Secondary | ICD-10-CM | POA: Diagnosis not present

## 2019-04-06 DIAGNOSIS — K219 Gastro-esophageal reflux disease without esophagitis: Secondary | ICD-10-CM | POA: Diagnosis not present

## 2019-04-06 DIAGNOSIS — E1122 Type 2 diabetes mellitus with diabetic chronic kidney disease: Secondary | ICD-10-CM | POA: Insufficient documentation

## 2019-04-06 DIAGNOSIS — Z89411 Acquired absence of right great toe: Secondary | ICD-10-CM | POA: Diagnosis not present

## 2019-04-06 DIAGNOSIS — Z881 Allergy status to other antibiotic agents status: Secondary | ICD-10-CM | POA: Diagnosis not present

## 2019-04-06 DIAGNOSIS — Z794 Long term (current) use of insulin: Secondary | ICD-10-CM | POA: Insufficient documentation

## 2019-04-06 DIAGNOSIS — H409 Unspecified glaucoma: Secondary | ICD-10-CM | POA: Diagnosis not present

## 2019-04-06 DIAGNOSIS — I132 Hypertensive heart and chronic kidney disease with heart failure and with stage 5 chronic kidney disease, or end stage renal disease: Secondary | ICD-10-CM | POA: Diagnosis not present

## 2019-04-06 DIAGNOSIS — I509 Heart failure, unspecified: Secondary | ICD-10-CM | POA: Diagnosis not present

## 2019-04-06 DIAGNOSIS — K3184 Gastroparesis: Secondary | ICD-10-CM | POA: Insufficient documentation

## 2019-04-06 DIAGNOSIS — N186 End stage renal disease: Secondary | ICD-10-CM

## 2019-04-06 DIAGNOSIS — Z880 Allergy status to penicillin: Secondary | ICD-10-CM | POA: Diagnosis not present

## 2019-04-06 DIAGNOSIS — Z79899 Other long term (current) drug therapy: Secondary | ICD-10-CM | POA: Diagnosis not present

## 2019-04-06 DIAGNOSIS — Z886 Allergy status to analgesic agent status: Secondary | ICD-10-CM | POA: Diagnosis not present

## 2019-04-06 DIAGNOSIS — E785 Hyperlipidemia, unspecified: Secondary | ICD-10-CM | POA: Insufficient documentation

## 2019-04-06 DIAGNOSIS — I10 Essential (primary) hypertension: Secondary | ICD-10-CM | POA: Diagnosis not present

## 2019-04-06 DIAGNOSIS — N183 Chronic kidney disease, stage 3 (moderate): Secondary | ICD-10-CM | POA: Diagnosis not present

## 2019-04-06 DIAGNOSIS — F419 Anxiety disorder, unspecified: Secondary | ICD-10-CM | POA: Insufficient documentation

## 2019-04-06 DIAGNOSIS — N185 Chronic kidney disease, stage 5: Secondary | ICD-10-CM | POA: Diagnosis not present

## 2019-04-06 DIAGNOSIS — E782 Mixed hyperlipidemia: Secondary | ICD-10-CM | POA: Diagnosis not present

## 2019-04-06 DIAGNOSIS — I120 Hypertensive chronic kidney disease with stage 5 chronic kidney disease or end stage renal disease: Secondary | ICD-10-CM | POA: Diagnosis not present

## 2019-04-06 HISTORY — PX: AV FISTULA INSERTION W/ RF MAGNETIC GUIDANCE: CATH118308

## 2019-04-06 LAB — CBC
HCT: 22.3 % — ABNORMAL LOW (ref 39.0–52.0)
Hemoglobin: 7.5 g/dL — ABNORMAL LOW (ref 13.0–17.0)
MCH: 30.5 pg (ref 26.0–34.0)
MCHC: 33.6 g/dL (ref 30.0–36.0)
MCV: 90.7 fL (ref 80.0–100.0)
Platelets: 113 10*3/uL — ABNORMAL LOW (ref 150–400)
RBC: 2.46 MIL/uL — ABNORMAL LOW (ref 4.22–5.81)
RDW: 12.7 % (ref 11.5–15.5)
WBC: 5.6 10*3/uL (ref 4.0–10.5)
nRBC: 0 % (ref 0.0–0.2)

## 2019-04-06 LAB — GLUCOSE, CAPILLARY
Glucose-Capillary: 127 mg/dL — ABNORMAL HIGH (ref 70–99)
Glucose-Capillary: 142 mg/dL — ABNORMAL HIGH (ref 70–99)

## 2019-04-06 SURGERY — AV FISTULA INSERTION W/RF MAGNETIC GUIDANCE
Anesthesia: General | Laterality: Left

## 2019-04-06 MED ORDER — SEVOFLURANE IN SOLN
RESPIRATORY_TRACT | Status: AC
  Filled 2019-04-06: qty 250

## 2019-04-06 MED ORDER — ROCURONIUM BROMIDE 100 MG/10ML IV SOLN
INTRAVENOUS | Status: DC | PRN
  Administered 2019-04-06: 5 mg via INTRAVENOUS
  Administered 2019-04-06: 10 mg via INTRAVENOUS
  Administered 2019-04-06: 20 mg via INTRAVENOUS
  Administered 2019-04-06: 45 mg via INTRAVENOUS

## 2019-04-06 MED ORDER — METHYLPREDNISOLONE SODIUM SUCC 125 MG IJ SOLR
INTRAMUSCULAR | Status: DC | PRN
  Administered 2019-04-06: 125 mg via INTRAVENOUS

## 2019-04-06 MED ORDER — HEPARIN SODIUM (PORCINE) 5000 UNIT/ML IJ SOLN
INTRAMUSCULAR | Status: AC
  Filled 2019-04-06: qty 2

## 2019-04-06 MED ORDER — LIDOCAINE HCL (CARDIAC) PF 100 MG/5ML IV SOSY
PREFILLED_SYRINGE | INTRAVENOUS | Status: DC | PRN
  Administered 2019-04-06: 100 mg via INTRAVENOUS

## 2019-04-06 MED ORDER — FENTANYL CITRATE (PF) 100 MCG/2ML IJ SOLN
INTRAMUSCULAR | Status: DC | PRN
  Administered 2019-04-06 (×2): 50 ug via INTRAVENOUS

## 2019-04-06 MED ORDER — SUGAMMADEX SODIUM 200 MG/2ML IV SOLN
INTRAVENOUS | Status: DC | PRN
  Administered 2019-04-06: 276.6 mg via INTRAVENOUS

## 2019-04-06 MED ORDER — PROPOFOL 10 MG/ML IV BOLUS
INTRAVENOUS | Status: AC
  Filled 2019-04-06: qty 20

## 2019-04-06 MED ORDER — FENTANYL CITRATE (PF) 100 MCG/2ML IJ SOLN
INTRAMUSCULAR | Status: AC
  Filled 2019-04-06: qty 2

## 2019-04-06 MED ORDER — HYDROMORPHONE HCL 1 MG/ML IJ SOLN: 1.0000 mg | Freq: Once | INTRAMUSCULAR | Status: DC | PRN

## 2019-04-06 MED ORDER — DIPHENHYDRAMINE HCL 50 MG/ML IJ SOLN
INTRAMUSCULAR | Status: AC
  Filled 2019-04-06: qty 1

## 2019-04-06 MED ORDER — FENTANYL CITRATE (PF) 100 MCG/2ML IJ SOLN: 25.0000 ug | INTRAMUSCULAR | Status: DC | PRN

## 2019-04-06 MED ORDER — HEPARIN SODIUM (PORCINE) 1000 UNIT/ML IJ SOLN
INTRAMUSCULAR | Status: AC
  Filled 2019-04-06: qty 1

## 2019-04-06 MED ORDER — SODIUM CHLORIDE 0.9 % IV SOLN
INTRAVENOUS | Status: DC
  Administered 2019-04-06: 13:00:00 via INTRAVENOUS

## 2019-04-06 MED ORDER — ONDANSETRON HCL 4 MG/2ML IJ SOLN
4.0000 mg | Freq: Four times a day (QID) | INTRAMUSCULAR | Status: DC | PRN
  Administered 2019-04-06: 15:00:00 4 mg via INTRAVENOUS

## 2019-04-06 MED ORDER — SUCCINYLCHOLINE CHLORIDE 20 MG/ML IJ SOLN
INTRAMUSCULAR | Status: DC | PRN
  Administered 2019-04-06: 120 mg via INTRAVENOUS

## 2019-04-06 MED ORDER — MIDAZOLAM HCL 2 MG/2ML IJ SOLN
INTRAMUSCULAR | Status: AC
  Filled 2019-04-06: qty 2

## 2019-04-06 MED ORDER — VERAPAMIL HCL 2.5 MG/ML IV SOLN
INTRAVENOUS | Status: AC
  Filled 2019-04-06: qty 2

## 2019-04-06 MED ORDER — ONDANSETRON HCL 4 MG/2ML IJ SOLN
INTRAMUSCULAR | Status: AC
  Filled 2019-04-06: qty 2

## 2019-04-06 MED ORDER — IODIXANOL 320 MG/ML IV SOLN
INTRAVENOUS | Status: DC | PRN
  Administered 2019-04-06: 15 mL via INTRAVENOUS

## 2019-04-06 MED ORDER — NITROGLYCERIN 5 MG/ML IV SOLN
INTRAVENOUS | Status: AC
  Filled 2019-04-06: qty 10

## 2019-04-06 MED ORDER — DIPHENHYDRAMINE HCL 50 MG/ML IJ SOLN
INTRAMUSCULAR | Status: DC | PRN
  Administered 2019-04-06: 50 mg via INTRAVENOUS

## 2019-04-06 MED ORDER — OXYCODONE HCL 5 MG PO TABS: 5.0000 mg | ORAL_TABLET | Freq: Once | ORAL | Status: DC | PRN

## 2019-04-06 MED ORDER — OXYCODONE HCL 5 MG/5ML PO SOLN
5.0000 mg | Freq: Once | ORAL | Status: DC | PRN
  Filled 2019-04-06: qty 5

## 2019-04-06 MED ORDER — PROPOFOL 10 MG/ML IV BOLUS
INTRAVENOUS | Status: DC | PRN
  Administered 2019-04-06: 180 mg via INTRAVENOUS

## 2019-04-06 MED ORDER — SUGAMMADEX SODIUM 500 MG/5ML IV SOLN
INTRAVENOUS | Status: AC
  Filled 2019-04-06: qty 5

## 2019-04-06 MED ORDER — METHYLPREDNISOLONE SODIUM SUCC 125 MG IJ SOLR
INTRAMUSCULAR | Status: AC
  Filled 2019-04-06: qty 2

## 2019-04-06 MED ORDER — CLINDAMYCIN PHOSPHATE 300 MG/50ML IV SOLN
INTRAVENOUS | Status: AC
  Filled 2019-04-06: qty 50

## 2019-04-06 MED ORDER — MIDAZOLAM HCL 2 MG/2ML IJ SOLN
INTRAMUSCULAR | Status: DC | PRN
  Administered 2019-04-06: 2 mg via INTRAVENOUS

## 2019-04-06 SURGICAL SUPPLY — 17 items
CATH BEACON 5 .035 40 KMP TP (CATHETERS) ×1 IMPLANT
CATH BEACON 5 .038 40 KMP TP (CATHETERS) ×1
CATH CANNON HEMO 15FR 19 (HEMODIALYSIS SUPPLIES) ×2 IMPLANT
DERMABOND ADVANCED (GAUZE/BANDAGES/DRESSINGS) ×1
DERMABOND ADVANCED .7 DNX12 (GAUZE/BANDAGES/DRESSINGS) ×1 IMPLANT
DEVICE TORQUE .025-.038 (MISCELLANEOUS) ×2 IMPLANT
DRAPE BRACHIAL (DRAPES) ×2 IMPLANT
DRAPE INCISE IOBAN 66X45 STRL (DRAPES) ×2 IMPLANT
GUIDEWIRE ANGLED .035 180CM (WIRE) ×2 IMPLANT
NEEDLE ENTRY 21GA 7CM ECHOTIP (NEEDLE) ×2 IMPLANT
PACK ANGIOGRAPHY (CUSTOM PROCEDURE TRAY) ×2 IMPLANT
SET INTRO CAPELLA COAXIAL (SET/KITS/TRAYS/PACK) ×2 IMPLANT
SHEATH HALO 035 5FRX10 (SHEATH) ×2 IMPLANT
SUT MNCRL 4-0 (SUTURE) ×1
SUT MNCRL 4-0 27XMFL (SUTURE) ×1
SUT PROLENE 0 CT 1 30 (SUTURE) ×2 IMPLANT
SUTURE MNCRL 4-0 27XMF (SUTURE) ×1 IMPLANT

## 2019-04-06 NOTE — H&P (Signed)
@LOGO @   MRN : [id number]  Timothy Clark is a 48 y.o. (June 09, 1971) male who presents with chief complaint of No chief complaint on file. [name]  History of Present Illness:   The patient is a 48 year old gentleman with known renal insufficiency who is now progressed and will require initiation of hemodialysis.  He was seen 44 days ago where vein mapping was reviewed.  Is a candidate for endovascular fistula creation.  After reviewing the different methods he elected to proceed with wave link fistula creation.  As part of the requirements for general anesthesia cardiac clearance was required and now we are ready to proceed.  During the interim he has progressed with his uremic symptoms and his nephrologist is now requesting a tunnel catheter as well so that he can be initiated within the week.  His uremic symptoms include increased tiredness increased nausea loss of appetite and lethargy.   Current Meds  Medication Sig  . amLODipine (NORVASC) 5 MG tablet Take 5 mg by mouth daily.  [name] atorvastatin (LIPITOR) 10 MG tablet Take 10 mg by mouth at bedtime.   . brimonidine (ALPHAGAN) 0.15 % ophthalmic solution Place 1 drop into both eyes 3 (three) times daily.   . carvedilol (COREG) 25 MG tablet Take 25 mg by mouth 2 (two) times daily with a meal.  . cloNIDine (CATAPRES) 0.1 MG tablet Take 0.1 mg by mouth 3 (three) times daily as needed. BP <140/90  . cloNIDine (CATAPRES) 0.2 MG tablet Take 0.2 mg by mouth 3 (three) times daily.   . cyanocobalamin (,VITAMIN B-12,) 1000 MCG/ML injection Inject 1,000 mcg into the muscle every 30 (thirty) days.   . dorzolamide (TRUSOPT) 2 % ophthalmic solution Place 1 drop into both eyes 2 (two) times daily.   . folic acid (FOLVITE) 1 MG tablet Take 1 mg by mouth daily.  . hydrALAZINE (APRESOLINE) 100 MG tablet Take 100 mg by mouth 3 (three) times daily.  . insulin aspart protamine- aspart (NOVOLOG MIX 70/30) (70-30) 100 UNIT/ML injection Inject 0.25 mLs (25 Units total)  into the skin 2 (two) times daily with a meal. (Patient taking differently: Inject 25-28 Units into the skin See admin instructions. 28 units in morning and 25 units at night)  . LORazepam (ATIVAN) 1 MG tablet Take 1 mg by mouth 2 (two) times daily as needed for anxiety.   . metoCLOPramide (REGLAN) 5 MG tablet Take 1 tablet (5 mg total) by mouth 2 (two) times daily at 8 am and 10 pm.  . pantoprazole (PROTONIX) 40 MG tablet Take 1 tablet (40 mg total) by mouth 2 (two) times daily before a meal.  . potassium chloride SA (K-DUR) 20 MEQ tablet Take 20 mEq by mouth daily.  . prednisoLONE acetate (PRED FORTE) 1 % ophthalmic suspension Place 1 drop into the right eye daily.   . promethazine (PHENERGAN) 25 MG tablet Take 12.5 mg by mouth every 6 (six) hours as needed for nausea or vomiting.   . sitaGLIPtin (JANUVIA) 25 MG tablet Take 25 mg by mouth daily.   . sucralfate (CARAFATE) 1 g tablet Take 1 g by mouth 4 (four) times daily -  with meals and at bedtime.  . timolol (TIMOPTIC) 0.5 % ophthalmic solution Place 1 drop into both eyes 2 (two) times daily.  [name] torsemide (DEMADEX) 20 MG tablet Take 40 mg by mouth daily.    Past Medical History:  Diagnosis Date  . Anxiety   . CHF (congestive heart failure) ([place])   .  Chronic abdominal pain   . Depression   . Esophagitis   . Eye hemorrhage   . Gastritis   . Gastroparesis   . GERD (gastroesophageal reflux disease)   . Glaucoma   . Helicobacter pylori (H. pylori) infection 2016   ERADICATION DOCUMENTED VIA STOOL TEST in AUG 2016 at [place] [facility]  . Hiccups   . Hypertension   . Nausea and vomiting    chronic, recurrent  . Neuropathy   . Renal insufficiency   . Type 2 diabetes mellitus with complications (HCC)    diagnosed around age 57    Past Surgical History:  Procedure Laterality Date  . AMPUTATION TOE Right    great toe  . CATARACT EXTRACTION W/PHACO Left 05/19/2016   Procedure: CATARACT EXTRACTION PHACO AND INTRAOCULAR LENS PLACEMENT LEFT EYE;   Surgeon: Tonny Branch, MD;  Location: [facility];  Service: Ophthalmology;  Laterality: Left;  CDE: 7.30  . CATARACT EXTRACTION W/PHACO Right 06/23/2016   Procedure: CATARACT EXTRACTION PHACO AND INTRAOCULAR LENS PLACEMENT RIGHT EYE CDE=9.87;  Surgeon: Tonny Branch, MD;  Location: [facility];  Service: Ophthalmology;  Laterality: Right;  right  . ESOPHAGOGASTRODUODENOSCOPY  2015   Dr. Britta Mccreedy  . ESOPHAGOGASTRODUODENOSCOPY N/A 10/26/2014   RMR: Distal esophagititis-likely reflux related although an element of pill induced injuruy no exclueded  status post biopsy. Diffusely abnormal gastric mucosa of uncertain signigicane -status post gastric biopsy. Focal area of excoriation in the cardia most consistant with a trauma of heaving.   . ESOPHAGOGASTRODUODENOSCOPY  08/2015   [facility]: mild esophagitis and old gastric contents  . EYE SURGERY  2015   stent placed to left eye  . EYE SURGERY    . INCISION AND DRAINAGE OF WOUND Right 07/16/2017   Procedure: IRRIGATION AND DEBRIDEMENT OF SOFT TISSUE OF ULCERATION RIGHT FOOT;  Surgeon: Caprice Beaver, DPM;  Location: [facility];  Service: Podiatry;  Laterality: Right;    Social History Social History   Tobacco Use  . Smoking status: Never Smoker  . Smokeless tobacco: Never Used  Substance Use Topics  . Alcohol use: No    Alcohol/week: 0.0 standard drinks  . Drug use: No    Family History Family History  Problem Relation Age of Onset  . Ovarian cancer Mother   . Heart attack Father   . Colon polyps Father   . Cervical cancer Sister   . Diabetes Sister   . Colon cancer Neg Hx     Allergies  Allergen Reactions  . Donnatal [Pb-Hyoscy-Atropine-Scopolamine] Anaphylaxis and Other (See Comments)    Reaction to GI cocktail  . Fish Allergy Anaphylaxis, Hives and Rash  . Haldol [Haloperidol Lactate] Other (See Comments)    Chest Pain  . Maalox [Calcium Carbonate Antacid] Anaphylaxis and Other (See Comments)    Reaction to GI cocktail  . Marinol [Dronabinol]  Nausea And Vomiting and Other (See Comments)    Caused worsening vomiting.   . Aspirin Hives and Itching  . Bactrim [Sulfamethoxazole-Trimethoprim] Itching  . Keflex [Cephalexin] Hives, Itching and Nausea And Vomiting  . Penicillins Hives, Itching and Other (See Comments)    Has patient had a PCN reaction causing immediate rash, facial/tongue/throat swelling, SOB or lightheadedness with hypotension: yes Has patient had a PCN reaction causing severe rash involving mucus membranes or skin necrosis: yes Has patient had a PCN reaction that required hospitalization no Has patient had a PCN reaction occurring within the last 10 years: yes If all of the above answers are "NO", then may proceed with Cephalospor  REVIEW OF SYSTEMS (Negative unless checked)  Constitutional: [] Weight loss  [] Fever  [] Chills Cardiac: [] Chest pain   [] Chest pressure   [] Palpitations   [] Shortness of breath when laying flat   [] Shortness of breath with exertion. Vascular:  [] Pain in legs with walking   [] Pain in legs at rest  [] History of DVT   [] Phlebitis   [] Swelling in legs   [] Varicose veins   [] Non-healing ulcers Pulmonary:   [] Uses home oxygen   [] Productive cough   [] Hemoptysis   [] Wheeze  [] COPD   [] Asthma Neurologic:  [] Dizziness   [] Seizures   [] History of stroke   [] History of TIA  [] Aphasia   [] Vissual changes   [] Weakness or numbness in arm   [] Weakness or numbness in leg Musculoskeletal:   [] Joint swelling   [] Joint pain   [] Low back pain Hematologic:  [] Easy bruising  [] Easy bleeding   [] Hypercoagulable state   [] Anemic Gastrointestinal:  [] Diarrhea   [] Vomiting  [] Gastroesophageal reflux/heartburn   [] Difficulty swallowing. Genitourinary:  [x] Chronic kidney disease   [] Difficult urination  [] Frequent urination   [] Blood in urine Skin:  [] Rashes   [] Ulcers  Psychological:  [] History of anxiety   []  History of major depression.  Physical Examination  Vitals:   04/06/19 1216  BP: (!) 176/90  Pulse:  70  Resp: 18  Temp: 98 F (36.7 C)  TempSrc: Oral  SpO2: 100%  Weight: (!) 138.3 kg  Height: 6' (1.829 m)   Body mass index is 41.37 kg/m. Gen: WD/WN, NAD Head: [place]/AT, No temporalis wasting.  Ear/Nose/Throat: Hearing grossly intact, nares w/o erythema or drainage Eyes: PER, EOMI, sclera nonicteric.  Neck: Supple, no large masses.   Pulmonary:  Good air movement, no audible wheezing bilaterally, no use of accessory muscles.  Cardiac: RRR, no JVD Vascular: Vessel Right Left  Radial Palpable Palpable  Ulnar Palpable Palpable  Brachial Palpable Palpable  Carotid Palpable Palpable  Gastrointestinal: Non-distended. No guarding/no peritoneal signs.  Musculoskeletal: M/S 5/5 throughout.  No deformity or atrophy.  Neurologic: CN 2-12 intact. Symmetrical.  Speech is fluent. Motor exam as listed above. Psychiatric: Judgment intact, Mood & affect appropriate for pt's clinical situation. Dermatologic: No rashes or ulcers noted.  No changes consistent with cellulitis. Lymph : No lichenification or skin changes of chronic lymphedema.  CBC Lab Results  Component Value Date   WBC 5.6 04/06/2019   HGB 7.5 (L) 04/06/2019   HCT 22.3 (L) 04/06/2019   MCV 90.7 04/06/2019   PLT 113 (L) 04/06/2019    BMET    Component Value Date/Time   NA 134 (L) 03/29/2019 0910   K 4.3 03/29/2019 0910   CL 105 03/29/2019 0910   CO2 20 (L) 03/29/2019 0910   GLUCOSE 193 (H) 03/29/2019 0910   BUN 84 (H) 03/29/2019 0910   CREATININE 7.39 (H) 03/29/2019 0910   CREATININE 1.20 06/22/2017 1436   CALCIUM 8.7 (L) 03/29/2019 0910   GFRNONAA 8 (L) 03/29/2019 0910   GFRAA 9 (L) 03/29/2019 0910   Estimated Creatinine Clearance: 17.4 mL/min (A) (by C-G formula based on SCr of 7.39 mg/dL (H)).  COAG Lab Results  Component Value Date   INR 1.2 03/29/2019   INR 1.1 10/25/2018   INR 1.05 06/13/2018    Radiology No results found.   Assessment/Plan 1. Chronic renal insufficiency, stage V  (HCC) Recommend:  The patient is experiencing increasing symptoms from his advanced renal insufficiency.  His vein mapping shows he has small superficial veins with his basilic being slightly larger  but still marginal.  He has 3 + mm perforators bilaterally.  Given these findings I recommend a left arm WavelinQ.  Additionally, the patient has progressed in his uremic symptoms and his nephrologist now requests a tunnel catheter placed that he can be initiated on hemodialysis within the week.  The risks, benefits and alternative therapies were reviewed in detail with the patient for both procedures.  All questions were answered.  The patient agrees to proceed with angio/intervention for [facility] creation of the left arm.    He will also undergo tunneled catheter placement in the right IJ distribution.   2. Diabetes mellitus type 2 with complications, uncontrolled ([place]) Continue hypoglycemic medications as already ordered, these medications have been reviewed and there are no changes at this time.  Hgb A1C to be monitored as already arranged by primary service   3. Essential hypertension Continue antihypertensive medications as already ordered, these medications have been reviewed and there are no changes at this time.  Patient has received cardiac clearance.  4. Gastroesophageal reflux disease without esophagitis Continue PPI as already ordered, this medication has been reviewed and there are no changes at this time.  Avoidence of caffeine and alcohol  Moderate elevation of the head of the bed   5. Hyperlipidemia, unspecified hyperlipidemia type Continue statin as ordered and reviewed, no changes at this time   6. Poor venous access This is a very problematic diagnosis for someone who hasn't even started dialysis.  Given the findings from the vein mapping I recommend WavelinQ left arm   [name], MD  04/06/2019 1:22 PM

## 2019-04-06 NOTE — Op Note (Signed)
OPERATIVE NOTE   PROCEDURE: 1. Insertion of tunneled dialysis catheter right IJ approach with ultrasound and fluoroscopic guidance. 2. Left arm venography first-order catheter placement  PRE-OPERATIVE DIAGNOSIS: Stage V renal insufficiency requiring hemodialysis  POST-OPERATIVE DIAGNOSIS: Same  SURGEON: [name].  ANESTHESIA: General anesthesia ESTIMATED BLOOD LOSS: Minimal cc  CONTRAST USED:  None  FLUOROSCOPY TIME: 0.8 minutes  INDICATIONS:   Timothy Barboza Hamlinis a 48 y.o. y.o. male who presents with worsening of his renal function.  He is now become uremic and nephrology wishes to initiate dialysis.  Initial plans were for creation of a wave link fistula however given his deteriorating condition we have been asked to place a tunnel catheter and then move forward with fistula creation.  This was discussed with the patient preoperatively and he agrees..  DESCRIPTION: After obtaining full informed written consent, the patient was positioned supine. The right neck and chest wall was prepped and draped in a sterile fashion. Ultrasound was placed in a sterile sleeve. Ultrasound was utilized to identify the right internal jugular vein which is noted to be echolucent and compressible indicating patency. Image is recorded for the permanent record. Under direct ultrasound visualization a micro-needle is inserted into the vein followed by the micro-wire. Micro-sheath was then advanced and a J wire is inserted without difficulty under fluoroscopic guidance. Small counterincision was made at the wire insertion site. Dilators are passed over the wire and the tunneled dialysis catheter is fed into the central venous system without difficulty.  Under fluoroscopy the catheter tip positioned at the atrial caval junction. The catheter is then approximated to the right chest wall and an exit site selected. 1% lidocaine is infiltrated in soft tissues at this level small incision is made and the  tunneling device is then passed from the exit site to the right neck counterincision. Catheter is then connected to the tunneling device and the catheter was pulled subcutaneously. It is then transected and the hub assembly connected without difficulty. Both lumens aspirate and flush easily. After verification of smooth contour with proper tip position under fluoroscopy the catheter is packed with 5000 units of heparin per lumen.  Catheter secured to the skin of the right chest wall with 0 silk. A sterile dressing is applied with a Biopatch.  Attention is then turned to his left arm which is been prepped circumferentially and extended palm upward.  Using ultrasound the ulnar artery and the ulnar veins are mapped from the wrist up to the antecubital fossa.  The dominant ulnar vein at the wrist is selected it is echolucent and compressible indicating patency.  Images recorded for the permanent record.  Under direct ultrasound visualization a micro needle is inserted into the vein followed by microwire.  Micro-sheath.  Hand-injection of contrast is then performed but this does not show the cephalic or basilic veins in the upper arm.  Therefore the 0.018 wire was reintroduced and a 5 [place] sheath is inserted followed by a floppy Glidewire and a Kumpe catheter.  Kumpe catheter was negotiated through the ulnar vein past the confluence with the radial veins and into the basilic vein representing first-order catheter placement.  Multiple injections were made in the area of the antecubital fossa.  Interpretation: Ulnar veins are widely patent as is the paired brachial veins.  There is indeed a perforating vein however the cephalic vein itself is less than 2 mm and does not collateralize well with the perforator.  The basilic vein in the area of the proximal forearm  and antecubital fossa is nonvisualized.  In the mid upper arm the basilic vein does begin to fill but it appears to be quite small.  Axillary vein and  distal subclavian vein are widely patent.  Conclusion: Although the patient does have a perforator neither his cephalic nor his basilic veins appear adequate for fistula creation and therefore discontinued the procedure and did not perform a fistula creation.  I have recommended the patient undergo a left forearm loop graft placement as he is now being initiated on dialysis.  COMPLICATIONS: None  CONDITION: Good  [name] [facility]. Office:  [phone]   04/06/2019,6:16 PM

## 2019-04-06 NOTE — Anesthesia Post-op Follow-up Note (Signed)
Anesthesia QCDR form completed.        

## 2019-04-06 NOTE — Anesthesia Procedure Notes (Addendum)
Procedure Name: Intubation Performed by: Demetrius Charity, CRNA Pre-anesthesia Checklist: Patient identified, Patient being monitored, Timeout performed, Emergency Drugs available and Suction available Patient Re-evaluated:Patient Re-evaluated prior to induction Oxygen Delivery Method: Circle system utilized Preoxygenation: Pre-oxygenation with 100% oxygen Induction Type: IV induction Ventilation: Oral airway inserted - appropriate to patient size and Two handed mask ventilation required Laryngoscope Size: McGraph and 4 Grade View: Grade II Tube type: Oral Tube size: 7.5 mm Number of attempts: 1 Airway Equipment and Method: Stylet Placement Confirmation: ETT inserted through vocal cords under direct vision,  positive ETCO2 and breath sounds checked- equal and bilateral Secured at: 21 cm Tube secured with: Tape Dental Injury: Teeth and Oropharynx as per pre-operative assessment  Difficulty Due To: Difficulty was anticipated, Difficult Airway- due to dentition and Difficult Airway- due to large tongue Future Recommendations: Recommend- induction with short-acting agent, and alternative techniques readily available

## 2019-04-06 NOTE — Anesthesia Preprocedure Evaluation (Signed)
Anesthesia Evaluation  Patient identified by MRN, date of birth, ID band Patient awake    Reviewed: Allergy & Precautions, H&P , NPO status , Patient's Chart, lab work & pertinent test results  History of Anesthesia Complications Negative for: history of anesthetic complications  Airway Mallampati: III  TM Distance: >3 FB Neck ROM: full    Dental  (+) Chipped, Poor Dentition, Missing   Pulmonary neg pulmonary ROS, neg shortness of breath,           Cardiovascular Exercise Tolerance: Good hypertension, (-) angina+ Peripheral Vascular Disease and +CHF  (-) Past MI and (-) DOE      Neuro/Psych PSYCHIATRIC DISORDERS negative neurological ROS  negative psych ROS   GI/Hepatic Neg liver ROS, GERD  Medicated and Controlled,  Endo/Other  diabetes, Type 2, Insulin Dependent  Renal/GU CRFRenal disease     Musculoskeletal   Abdominal   Peds  Hematology negative hematology ROS (+)   Anesthesia Other Findings Past Medical History: No date: Anxiety No date: CHF (congestive heart failure) (HCC) No date: Chronic abdominal pain No date: Depression No date: Esophagitis No date: Eye hemorrhage No date: Gastritis No date: Gastroparesis No date: GERD (gastroesophageal reflux disease) No date: Glaucoma [id number]: Helicobacter pylori (H. pylori) infection     Comment:  ERADICATION DOCUMENTED VIA STOOL TEST in AUG 2016 at               [facility] No date: Hiccups No date: Hypertension No date: Nausea and vomiting     Comment:  chronic, recurrent No date: Neuropathy No date: Renal insufficiency No date: Type 2 diabetes mellitus with complications (HCC)     Comment:  diagnosed around age 70  Past Surgical History: No date: AMPUTATION TOE; Right     Comment:  great toe 05/19/2016: CATARACT EXTRACTION W/PHACO; Left     Comment:  Procedure: CATARACT EXTRACTION PHACO AND INTRAOCULAR               LENS PLACEMENT LEFT EYE;  Surgeon: Tonny Branch, MD;                Location: [facility];  Service: Ophthalmology;  Laterality:               Left;  CDE: 7.30 06/23/2016: CATARACT EXTRACTION W/PHACO; Right     Comment:  Procedure: CATARACT EXTRACTION PHACO AND INTRAOCULAR               LENS PLACEMENT RIGHT EYE CDE=9.87;  Surgeon: Tonny Branch,               MD;  Location: [facility];  Service: Ophthalmology;                Laterality: Right;  right 2015: ESOPHAGOGASTRODUODENOSCOPY     Comment:  Dr. Britta Mccreedy 10/26/2014: ESOPHAGOGASTRODUODENOSCOPY; N/A     Comment:  RMR: Distal esophagititis-likely reflux related although              an element of pill induced injuruy no exclueded  status               post biopsy. Diffusely abnormal gastric mucosa of               uncertain signigicane -status post gastric biopsy. Focal               area of excoriation in the cardia most consistant with a               trauma of  heaving.  08/2015: ESOPHAGOGASTRODUODENOSCOPY     Comment:  [facility]: mild esophagitis and old gastric contents 2015: EYE SURGERY     Comment:  stent placed to left eye No date: EYE SURGERY 07/16/2017: INCISION AND DRAINAGE OF WOUND; Right     Comment:  Procedure: IRRIGATION AND DEBRIDEMENT OF SOFT TISSUE OF               ULCERATION RIGHT FOOT;  Surgeon: Caprice Beaver, DPM;              Location: [facility];  Service: Podiatry;  Laterality: Right;     Reproductive/Obstetrics negative OB ROS                             Anesthesia Physical Anesthesia Plan  ASA: IV  Anesthesia Plan: General ETT   Post-op Pain Management:    Induction: Intravenous  PONV Risk Score and Plan: Ondansetron, Dexamethasone, Midazolam and Treatment may vary due to age or medical condition  Airway Management Planned: Oral ETT and Video Laryngoscope Planned  Additional Equipment:   Intra-op Plan:   Post-operative Plan: Extubation in OR  Informed Consent: I have reviewed the patients History and Physical, chart, labs  and discussed the procedure including the risks, benefits and alternatives for the proposed anesthesia with the patient or authorized representative who has indicated his/her understanding and acceptance.     Dental Advisory Given  Plan Discussed with: Anesthesiologist, CRNA and Surgeon  Anesthesia Plan Comments: (Patient consented for risks of anesthesia including but not limited to:  - adverse reactions to medications - damage to teeth, lips or other oral mucosa - sore throat or hoarseness - Damage to heart, brain, lungs or loss of life  Patient voiced understanding.)        Anesthesia Quick Evaluation

## 2019-04-06 NOTE — Transfer of Care (Signed)
Immediate Anesthesia Transfer of Care Note  Patient: Timothy Clark  Procedure(s) Performed: AV FISTULA INSERTION W/RF MAGNETIC GUIDANCE (Left )  Patient Location: PACU  Anesthesia Type:General  Level of Consciousness: sedated  Airway & Oxygen Therapy: Patient Spontanous Breathing and Patient connected to face mask oxygen  Post-op Assessment: Report given to RN and Post -op Vital signs reviewed and stable  Post vital signs: Reviewed and stable  Last Vitals:  Vitals Value Taken Time  BP 181/95 04/06/19 1518  Temp    Pulse 80 04/06/19 1521  Resp 15 04/06/19 1521  SpO2 97 % 04/06/19 1521  Vitals shown include unvalidated device data.  Last Pain:  Vitals:   04/06/19 1216  TempSrc: Oral  PainSc: 0-No pain         Complications: No apparent anesthesia complications

## 2019-04-06 NOTE — Discharge Instructions (Signed)
Tunneled Catheter Insertion, Care After °This sheet gives you information about how to care for yourself after your procedure. Your health care provider may also give you more specific instructions. If you have problems or questions, contact your health care provider. °What can I expect after the procedure? °After the procedure, it is common to have: °· Some mild redness, bruising, swelling, and pain around your catheter site. °· A small amount of blood or clear fluid coming from your incisions. °Follow these instructions at home: °Incision care ° °· Follow instructions from your health care provider about how to take care of your incisions. Make sure you: °? Wash your hands with soap and water before and after you change your bandages (dressings). If soap and water are not available, use hand sanitizer. °? Change your dressings as told by your health care provider. Wash the area around your incisions with a germ-killing (antiseptic) solution when you change your dressings. °? Leave stitches (sutures), skin glue, or adhesive strips in place. These skin closures may need to stay in place for 2 weeks or longer. If adhesive strip edges start to loosen and curl up, you may trim the loose edges. Do not remove adhesive strips completely unless your health care provider tells you to do that. °· Keep your dressings clean and dry. °· Check your incision areas every day for signs of infection. Check for: °? More redness, swelling, or pain. °? More fluid or blood. °? Warmth. °? Pus or a bad smell. °Catheter care ° °· Wash your hands with soap and water before and after caring for your catheter. If soap and water are not available, use hand sanitizer. °· Keep your catheter site clean and dry. °· Apply an antibiotic ointment to your catheter site as told by your health care provider. °· Flush your catheter as told by your health care provider. This helps prevent it from becoming clogged. °· Do not open the caps on the ends of  the catheter. °· Do not pull on your catheter. °Medicines °· Take over-the-counter and prescription medicines only as told by your health care provider. °· If you were prescribed an antibiotic medicine, take it as told by your health care provider. Do not stop taking the antibiotic even if you start to feel better. °Activity °· Return to your normal activities as told by your health care provider. Ask your health care provider what activities are safe for you. °· Follow any other activity restrictions as instructed by your health care provider. °· Do not lift anything that is heavier than 10 lb (4.5 kg), or the limit that you are told, until your health care provider says that it is safe. °Driving °· Do not drive until your health care provider approves. °· Ask your health care provider if the medicine prescribed to you requires you to avoid driving or using heavy machinery. °General instructions °· Follow your health care provider's specific instructions for the type of catheter that you have. °· Do not take baths, swim, or use a hot tub until your health care provider approves. Ask your health care provider if you may take showers. °· Keep all follow-up visits as told by your health care provider. This is important. °Contact a health care provider if: °· You feel unusually weak or nauseous. °· You have more redness, swelling, or pain at your incisions or around the area where your catheter is inserted. °· Your catheter is not working properly. °· You are unable to flush your catheter. °  Get help right away if: °· Your catheter develops a hole or it breaks. °· You have pain or swelling when fluids or medicines are being given through the catheter. °· Fluid is leaking from the catheter, under the dressing, or around the dressing. °· Your catheter comes loose or gets pulled completely out. If this happens, press on your catheter site firmly with a clean cloth until you can get medical help. °· You have swelling in  your shoulder, neck, chest, or face. °· You have chest pain or difficulty breathing. °· You feel dizzy or light-headed. °· You have pus or a bad smell coming from your catheter site. °· You have a fever or chills. °· Your catheter site feels warm to the touch. °· You develop bleeding from your catheter or your insertion site, and your bleeding does not stop. °Summary °· After the procedure, it is common to have mild redness, swelling, and pain around your catheter site. °· Return to your normal activities as told by your health care provider. Ask your health care provider what activities are safe for you. °· Follow your health care provider's specific instructions for the type of catheter that you have. °· Keep your catheter site and your dressings clean and dry. °· Contact a health care provider if your catheter is not working properly. Get help right away if you have chest pain, fever, or difficulty breathing. °This information is not intended to replace advice given to you by your health care provider. Make sure you discuss any questions you have with your health care provider. °Document Released: 07/14/2012 Document Revised: 07/20/2018 Document Reviewed: 07/20/2018 °Elsevier Patient Education © 2020 [employer] ° ° ° °Moderate Conscious Sedation, Adult, Care After °These instructions provide you with information about caring for yourself after your procedure. Your health care provider may also give you more specific instructions. Your treatment has been planned according to current medical practices, but problems sometimes occur. Call your health care provider if you have any problems or questions after your procedure. °What can I expect after the procedure? °After your procedure, it is common: °· To feel sleepy for several hours. °· To feel clumsy and have poor balance for several hours. °· To have poor judgment for several hours. °· To vomit if you eat too soon. °Follow these instructions at home: °For at  least 24 hours after the procedure: ° °· Do not: °? Participate in activities where you could fall or become injured. °? Drive. °? Use heavy machinery. °? Drink alcohol. °? Take sleeping pills or medicines that cause drowsiness. °? Make important decisions or sign legal documents. °? Take care of children on your own. °· Rest. °Eating and drinking °· Follow the diet recommended by your health care provider. °· If you vomit: °? Drink water, juice, or soup when you can drink without vomiting. °? Make sure you have little or no nausea before eating solid foods. °General instructions °· Have a responsible adult stay with you until you are awake and alert. °· Take over-the-counter and prescription medicines only as told by your health care provider. °· If you smoke, do not smoke without supervision. °· Keep all follow-up visits as told by your health care provider. This is important. °Contact a health care provider if: °· You keep feeling nauseous or you keep vomiting. °· You feel light-headed. °· You develop a rash. °· You have a fever. °Get help right away if: °· You have trouble breathing. °This information is not intended   to replace advice given to you by your health care provider. Make sure you discuss any questions you have with your health care provider. °Document Released: 05/18/2013 Document Revised: 07/10/2017 Document Reviewed: 11/17/2015 °Elsevier Patient Education © 2020 [employer] ° °

## 2019-04-07 ENCOUNTER — Encounter: Payer: Self-pay | Admitting: Vascular Surgery

## 2019-04-07 ENCOUNTER — Telehealth (INDEPENDENT_AMBULATORY_CARE_PROVIDER_SITE_OTHER): Payer: Self-pay

## 2019-04-07 LAB — TYPE AND SCREEN
ABO/RH(D): O POS
Antibody Screen: NEGATIVE
Unit division: 0
Unit division: 0

## 2019-04-07 LAB — BPAM RBC
Blood Product Expiration Date: 202009172359
Blood Product Expiration Date: 202009172359
ISSUE DATE / TIME: 202008190210
Unit Type and Rh: 5100
Unit Type and Rh: 5100

## 2019-04-07 NOTE — Telephone Encounter (Signed)
Spoke with the patient and he is now scheduled with Dr. Delana Meyer for 04/15/2019 surgery. Patient will do his Covid testing on 04/12/2019 between 12:30-2:30 pm at the [facility]. Pre-procedure instructions will be mailed to the patient.

## 2019-04-08 DIAGNOSIS — E1122 Type 2 diabetes mellitus with diabetic chronic kidney disease: Secondary | ICD-10-CM | POA: Diagnosis not present

## 2019-04-08 DIAGNOSIS — E1151 Type 2 diabetes mellitus with diabetic peripheral angiopathy without gangrene: Secondary | ICD-10-CM | POA: Diagnosis not present

## 2019-04-08 DIAGNOSIS — N184 Chronic kidney disease, stage 4 (severe): Secondary | ICD-10-CM | POA: Diagnosis not present

## 2019-04-08 DIAGNOSIS — I5032 Chronic diastolic (congestive) heart failure: Secondary | ICD-10-CM | POA: Diagnosis not present

## 2019-04-08 DIAGNOSIS — I130 Hypertensive heart and chronic kidney disease with heart failure and stage 1 through stage 4 chronic kidney disease, or unspecified chronic kidney disease: Secondary | ICD-10-CM | POA: Diagnosis not present

## 2019-04-08 DIAGNOSIS — E1143 Type 2 diabetes mellitus with diabetic autonomic (poly)neuropathy: Secondary | ICD-10-CM | POA: Diagnosis not present

## 2019-04-08 LAB — PREPARE RBC (CROSSMATCH)

## 2019-04-08 NOTE — Anesthesia Postprocedure Evaluation (Signed)
Anesthesia Post Note  Patient: Timothy Clark  Procedure(s) Performed: AV FISTULA INSERTION W/RF MAGNETIC GUIDANCE (Left )  Patient location during evaluation: PACU Anesthesia Type: General Level of consciousness: awake and alert and oriented Pain management: pain level controlled Vital Signs Assessment: post-procedure vital signs reviewed and stable Respiratory status: spontaneous breathing Cardiovascular status: blood pressure returned to baseline Anesthetic complications: no     Last Vitals:  Vitals:   04/06/19 1600 04/06/19 1615  BP: (!) 186/97 (!) 188/99  Pulse: 81 80  Resp: 13 14  Temp:    SpO2: 98% 97%    Last Pain:  Vitals:   04/06/19 1615  TempSrc:   PainSc: 0-No pain                 [name]

## 2019-04-11 ENCOUNTER — Other Ambulatory Visit: Payer: Medicare HMO

## 2019-04-11 NOTE — Telephone Encounter (Signed)
Patient's sister called in to reschedule the patient's surgery from 04/15/2019 to 04/29/2019 with Dr. Delana Meyer. Patient will need a new Covid testing time as well. Per the sister I will schedule the patient and mailed the information to the patient.

## 2019-04-12 ENCOUNTER — Other Ambulatory Visit (INDEPENDENT_AMBULATORY_CARE_PROVIDER_SITE_OTHER): Payer: Self-pay | Admitting: Nurse Practitioner

## 2019-04-12 ENCOUNTER — Other Ambulatory Visit: Admission: RE | Admit: 2019-04-12 | Payer: Medicare HMO | Source: Ambulatory Visit

## 2019-04-12 ENCOUNTER — Encounter (INDEPENDENT_AMBULATORY_CARE_PROVIDER_SITE_OTHER): Payer: Self-pay

## 2019-04-13 DIAGNOSIS — E1122 Type 2 diabetes mellitus with diabetic chronic kidney disease: Secondary | ICD-10-CM | POA: Diagnosis not present

## 2019-04-13 DIAGNOSIS — N184 Chronic kidney disease, stage 4 (severe): Secondary | ICD-10-CM | POA: Diagnosis not present

## 2019-04-13 DIAGNOSIS — E1151 Type 2 diabetes mellitus with diabetic peripheral angiopathy without gangrene: Secondary | ICD-10-CM | POA: Diagnosis not present

## 2019-04-13 DIAGNOSIS — I130 Hypertensive heart and chronic kidney disease with heart failure and stage 1 through stage 4 chronic kidney disease, or unspecified chronic kidney disease: Secondary | ICD-10-CM | POA: Diagnosis not present

## 2019-04-13 DIAGNOSIS — E1143 Type 2 diabetes mellitus with diabetic autonomic (poly)neuropathy: Secondary | ICD-10-CM | POA: Diagnosis not present

## 2019-04-13 DIAGNOSIS — I5032 Chronic diastolic (congestive) heart failure: Secondary | ICD-10-CM | POA: Diagnosis not present

## 2019-04-14 DIAGNOSIS — N186 End stage renal disease: Secondary | ICD-10-CM | POA: Diagnosis not present

## 2019-04-14 DIAGNOSIS — E785 Hyperlipidemia, unspecified: Secondary | ICD-10-CM | POA: Diagnosis not present

## 2019-04-14 DIAGNOSIS — E119 Type 2 diabetes mellitus without complications: Secondary | ICD-10-CM | POA: Diagnosis not present

## 2019-04-14 DIAGNOSIS — I10 Essential (primary) hypertension: Secondary | ICD-10-CM | POA: Diagnosis not present

## 2019-04-14 DIAGNOSIS — E1129 Type 2 diabetes mellitus with other diabetic kidney complication: Secondary | ICD-10-CM | POA: Diagnosis not present

## 2019-04-14 DIAGNOSIS — Z23 Encounter for immunization: Secondary | ICD-10-CM | POA: Diagnosis not present

## 2019-04-14 DIAGNOSIS — D638 Anemia in other chronic diseases classified elsewhere: Secondary | ICD-10-CM | POA: Diagnosis not present

## 2019-04-14 DIAGNOSIS — I509 Heart failure, unspecified: Secondary | ICD-10-CM | POA: Diagnosis not present

## 2019-04-14 DIAGNOSIS — Z992 Dependence on renal dialysis: Secondary | ICD-10-CM | POA: Diagnosis not present

## 2019-04-14 DIAGNOSIS — Z1159 Encounter for screening for other viral diseases: Secondary | ICD-10-CM | POA: Diagnosis not present

## 2019-04-16 DIAGNOSIS — Z992 Dependence on renal dialysis: Secondary | ICD-10-CM | POA: Diagnosis not present

## 2019-04-16 DIAGNOSIS — N186 End stage renal disease: Secondary | ICD-10-CM | POA: Diagnosis not present

## 2019-04-16 DIAGNOSIS — Z23 Encounter for immunization: Secondary | ICD-10-CM | POA: Diagnosis not present

## 2019-04-19 DIAGNOSIS — Z23 Encounter for immunization: Secondary | ICD-10-CM | POA: Diagnosis not present

## 2019-04-19 DIAGNOSIS — I5032 Chronic diastolic (congestive) heart failure: Secondary | ICD-10-CM | POA: Diagnosis not present

## 2019-04-19 DIAGNOSIS — E1143 Type 2 diabetes mellitus with diabetic autonomic (poly)neuropathy: Secondary | ICD-10-CM | POA: Diagnosis not present

## 2019-04-19 DIAGNOSIS — E1151 Type 2 diabetes mellitus with diabetic peripheral angiopathy without gangrene: Secondary | ICD-10-CM | POA: Diagnosis not present

## 2019-04-19 DIAGNOSIS — E1122 Type 2 diabetes mellitus with diabetic chronic kidney disease: Secondary | ICD-10-CM | POA: Diagnosis not present

## 2019-04-19 DIAGNOSIS — Z992 Dependence on renal dialysis: Secondary | ICD-10-CM | POA: Diagnosis not present

## 2019-04-19 DIAGNOSIS — I130 Hypertensive heart and chronic kidney disease with heart failure and stage 1 through stage 4 chronic kidney disease, or unspecified chronic kidney disease: Secondary | ICD-10-CM | POA: Diagnosis not present

## 2019-04-19 DIAGNOSIS — N184 Chronic kidney disease, stage 4 (severe): Secondary | ICD-10-CM | POA: Diagnosis not present

## 2019-04-19 DIAGNOSIS — N186 End stage renal disease: Secondary | ICD-10-CM | POA: Diagnosis not present

## 2019-04-21 DIAGNOSIS — N186 End stage renal disease: Secondary | ICD-10-CM | POA: Diagnosis not present

## 2019-04-21 DIAGNOSIS — Z992 Dependence on renal dialysis: Secondary | ICD-10-CM | POA: Diagnosis not present

## 2019-04-21 DIAGNOSIS — Z23 Encounter for immunization: Secondary | ICD-10-CM | POA: Diagnosis not present

## 2019-04-22 ENCOUNTER — Inpatient Hospital Stay: Admission: RE | Admit: 2019-04-22 | Payer: Medicare HMO | Source: Ambulatory Visit

## 2019-04-23 DIAGNOSIS — Z992 Dependence on renal dialysis: Secondary | ICD-10-CM | POA: Diagnosis not present

## 2019-04-23 DIAGNOSIS — N186 End stage renal disease: Secondary | ICD-10-CM | POA: Diagnosis not present

## 2019-04-23 DIAGNOSIS — Z23 Encounter for immunization: Secondary | ICD-10-CM | POA: Diagnosis not present

## 2019-04-25 ENCOUNTER — Ambulatory Visit (INDEPENDENT_AMBULATORY_CARE_PROVIDER_SITE_OTHER): Payer: Medicare HMO | Admitting: Vascular Surgery

## 2019-04-25 DIAGNOSIS — E1151 Type 2 diabetes mellitus with diabetic peripheral angiopathy without gangrene: Secondary | ICD-10-CM | POA: Diagnosis not present

## 2019-04-25 DIAGNOSIS — I130 Hypertensive heart and chronic kidney disease with heart failure and stage 1 through stage 4 chronic kidney disease, or unspecified chronic kidney disease: Secondary | ICD-10-CM | POA: Diagnosis not present

## 2019-04-25 DIAGNOSIS — E1143 Type 2 diabetes mellitus with diabetic autonomic (poly)neuropathy: Secondary | ICD-10-CM | POA: Diagnosis not present

## 2019-04-25 DIAGNOSIS — E1122 Type 2 diabetes mellitus with diabetic chronic kidney disease: Secondary | ICD-10-CM | POA: Diagnosis not present

## 2019-04-25 DIAGNOSIS — I5032 Chronic diastolic (congestive) heart failure: Secondary | ICD-10-CM | POA: Diagnosis not present

## 2019-04-25 DIAGNOSIS — N184 Chronic kidney disease, stage 4 (severe): Secondary | ICD-10-CM | POA: Diagnosis not present

## 2019-04-26 ENCOUNTER — Other Ambulatory Visit: Payer: Medicare HMO

## 2019-04-26 ENCOUNTER — Encounter (INDEPENDENT_AMBULATORY_CARE_PROVIDER_SITE_OTHER): Payer: Self-pay

## 2019-04-26 ENCOUNTER — Telehealth (INDEPENDENT_AMBULATORY_CARE_PROVIDER_SITE_OTHER): Payer: Self-pay

## 2019-04-26 DIAGNOSIS — Z23 Encounter for immunization: Secondary | ICD-10-CM | POA: Diagnosis not present

## 2019-04-26 DIAGNOSIS — N186 End stage renal disease: Secondary | ICD-10-CM | POA: Diagnosis not present

## 2019-04-26 DIAGNOSIS — Z992 Dependence on renal dialysis: Secondary | ICD-10-CM | POA: Diagnosis not present

## 2019-04-26 NOTE — Telephone Encounter (Signed)
Spoke with the patient and he has been rescheduled to 05/06/2019 from 04/29/2019 with Dr. Delana Meyer. Patient will do his pre-op today at 10:00 at the [facility] and will do his Covid testing on 05/03/2019 between 12:30-2:30pm at the [facility]. I will sent out new paperwork with new surgery dates.

## 2019-04-27 ENCOUNTER — Encounter
Admission: RE | Admit: 2019-04-27 | Discharge: 2019-04-27 | Disposition: A | Discharge status: Home / Self Care | Payer: Medicare HMO | Source: Ambulatory Visit | Attending: Vascular Surgery | Admitting: Vascular Surgery

## 2019-04-27 ENCOUNTER — Other Ambulatory Visit: Payer: Self-pay

## 2019-04-27 DIAGNOSIS — D631 Anemia in chronic kidney disease: Secondary | ICD-10-CM | POA: Diagnosis not present

## 2019-04-27 DIAGNOSIS — I132 Hypertensive heart and chronic kidney disease with heart failure and with stage 5 chronic kidney disease, or end stage renal disease: Secondary | ICD-10-CM | POA: Insufficient documentation

## 2019-04-27 DIAGNOSIS — I509 Heart failure, unspecified: Secondary | ICD-10-CM | POA: Diagnosis not present

## 2019-04-27 DIAGNOSIS — K219 Gastro-esophageal reflux disease without esophagitis: Secondary | ICD-10-CM | POA: Diagnosis not present

## 2019-04-27 DIAGNOSIS — Z01812 Encounter for preprocedural laboratory examination: Secondary | ICD-10-CM | POA: Insufficient documentation

## 2019-04-27 DIAGNOSIS — N186 End stage renal disease: Secondary | ICD-10-CM | POA: Insufficient documentation

## 2019-04-27 DIAGNOSIS — E1122 Type 2 diabetes mellitus with diabetic chronic kidney disease: Secondary | ICD-10-CM | POA: Insufficient documentation

## 2019-04-27 LAB — CBC WITH DIFFERENTIAL/PLATELET
Abs Immature Granulocytes: 0.02 10*3/uL (ref 0.00–0.07)
Basophils Absolute: 0 10*3/uL (ref 0.0–0.1)
Basophils Relative: 0 %
Eosinophils Absolute: 0.1 10*3/uL (ref 0.0–0.5)
Eosinophils Relative: 2 %
HCT: 25.6 % — ABNORMAL LOW (ref 39.0–52.0)
Hemoglobin: 8.4 g/dL — ABNORMAL LOW (ref 13.0–17.0)
Immature Granulocytes: 0 %
Lymphocytes Relative: 21 %
Lymphs Abs: 1.1 10*3/uL (ref 0.7–4.0)
MCH: 31.2 pg (ref 26.0–34.0)
MCHC: 32.8 g/dL (ref 30.0–36.0)
MCV: 95.2 fL (ref 80.0–100.0)
Monocytes Absolute: 0.5 10*3/uL (ref 0.1–1.0)
Monocytes Relative: 9 %
Neutro Abs: 3.6 10*3/uL (ref 1.7–7.7)
Neutrophils Relative %: 68 %
Platelets: 123 10*3/uL — ABNORMAL LOW (ref 150–400)
RBC: 2.69 MIL/uL — ABNORMAL LOW (ref 4.22–5.81)
RDW: 13.8 % (ref 11.5–15.5)
WBC: 5.3 10*3/uL (ref 4.0–10.5)
nRBC: 0 % (ref 0.0–0.2)

## 2019-04-27 LAB — BASIC METABOLIC PANEL
Anion gap: 9 (ref 5–15)
BUN: 32 mg/dL — ABNORMAL HIGH (ref 6–20)
CO2: 25 mmol/L (ref 22–32)
Calcium: 8.7 mg/dL — ABNORMAL LOW (ref 8.9–10.3)
Chloride: 104 mmol/L (ref 98–111)
Creatinine, Ser: 5.71 mg/dL — ABNORMAL HIGH (ref 0.61–1.24)
GFR calc Af Amer: 12 mL/min — ABNORMAL LOW (ref >60)
GFR calc non Af Amer: 11 mL/min — ABNORMAL LOW (ref >60)
Glucose, Bld: 177 mg/dL — ABNORMAL HIGH (ref 70–99)
Potassium: 4 mmol/L (ref 3.5–5.1)
Sodium: 138 mmol/L (ref 135–145)

## 2019-04-27 LAB — TYPE AND SCREEN
ABO/RH(D): O POS
Antibody Screen: NEGATIVE
Extend sample reason: TRANSFUSED

## 2019-04-27 LAB — PROTIME-INR
INR: 1.1 (ref 0.8–1.2)
Prothrombin Time: 13.6 seconds (ref 11.4–15.2)

## 2019-04-27 LAB — APTT: aPTT: 32 seconds (ref 24–36)

## 2019-04-27 NOTE — Progress Notes (Signed)
Pre-Admit Testing Provider Notification Note  Provider Notified: Dr. Delana Meyer & Eulogio Ditch, NP  Notification Mode: Fax  Reason: Abnormal CBC and BMP Results  Response: Fax confirmation received.  Additional Information: N/A  Signed: Beulah Gandy, RN

## 2019-04-27 NOTE — Patient Instructions (Signed)
Your procedure is scheduled on: Friday 05/06/19.  Report to DAY SURGERY DEPARTMENT LOCATED ON 2ND FLOOR MEDICAL MALL ENTRANCE. To find out your arrival time please call [phone] between 1PM - 3PM on Thursday 05/05/19.   Remember: Instructions that are not followed completely may result in serious medical risk, up to and including death, or upon the discretion of your surgeon and anesthesiologist your surgery may need to be rescheduled.      _X__ 1. Do not eat food after midnight the night before your procedure.                 No gum chewing or hard candies. You may drink WATER up to 2 hours                 before you are scheduled to arrive for your surgery- DO NOT drink WATER                 within 2 hours of the start of your surgery.                   __X__2.  On the morning of surgery brush your teeth with toothpaste and water, you may rinse your mouth with mouthwash if you wish.  Do not swallow any toothpaste or mouthwash.       __X__3.  Notify your doctor if there is any change in your medical condition      (cold, fever, infections).      Do not wear jewelry, make-up, hairpins, clips or nail polish. Do not wear lotions, powders, or perfumes.  Do not shave 48 hours prior to surgery. Men may shave face and neck. Do not bring valuables to the hospital.    [facility] is not responsible for any belongings or valuables.   Contacts, dentures/partials or body piercings may not be worn into surgery. Bring a case for your contacts, glasses or hearing aids, a denture cup will be supplied.    Patients discharged the day of surgery will not be allowed to drive home.    Please read over the following fact sheets that you were given:   MRSA Information   __X__ Take these medicines the morning of surgery with A SIP OF WATER:     1. amLODipine (NORVASC) 5 MG tablet  2. brimonidine (ALPHAGAN) 0.15 % ophthalmic solution  3. carvedilol (COREG) 25 MG tablet  4. cloNIDine  (CATAPRES) 0.2 MG tablet  5. dorzolamide (TRUSOPT) 2 % ophthalmic solution  6. hydrALAZINE (APRESOLINE) 100 MG tablet  7. pantoprazole (PROTONIX) 40 MG tablet  8. metoCLOPramide (REGLAN) 5 MG tablet  9. prednisoLONE acetate (PRED FORTE) 1 % ophthalmic suspension  10. timolol (TIMOPTIC) 0.5 % ophthalmic solution     __X__ Use SAGE wipes as directed     __X__ Take 1/2 of usual insulin dose the night before surgery. No insulin the morning          of surgery.     __X__ Stop Anti-inflammatories 7 days before surgery such as Advil, Ibuprofen, Motrin, BC or Goodies Powder, Naprosyn, Naproxen, Aleve, Aspirin, Meloxicam. May take Tylenol if needed for pain or discomfort.

## 2019-04-27 NOTE — Progress Notes (Signed)
Pre-Admit Testing Provider Notification Note  Provider Notified: Eulogio Ditch, NP  Notification Mode: Secure Chat  Reason: H & P needed before procedure  Response: H&P will be provided day of procedure.  Additional Information:  Noted on Pre-Admit Worksheet  Signed: Beulah Gandy, RN

## 2019-04-28 DIAGNOSIS — Z992 Dependence on renal dialysis: Secondary | ICD-10-CM | POA: Diagnosis not present

## 2019-04-28 DIAGNOSIS — Z23 Encounter for immunization: Secondary | ICD-10-CM | POA: Diagnosis not present

## 2019-04-28 DIAGNOSIS — N186 End stage renal disease: Secondary | ICD-10-CM | POA: Diagnosis not present

## 2019-04-30 DIAGNOSIS — Z23 Encounter for immunization: Secondary | ICD-10-CM | POA: Diagnosis not present

## 2019-04-30 DIAGNOSIS — N186 End stage renal disease: Secondary | ICD-10-CM | POA: Diagnosis not present

## 2019-04-30 DIAGNOSIS — Z992 Dependence on renal dialysis: Secondary | ICD-10-CM | POA: Diagnosis not present

## 2019-05-02 DIAGNOSIS — Z992 Dependence on renal dialysis: Secondary | ICD-10-CM | POA: Diagnosis not present

## 2019-05-02 DIAGNOSIS — N186 End stage renal disease: Secondary | ICD-10-CM | POA: Diagnosis not present

## 2019-05-02 DIAGNOSIS — Z23 Encounter for immunization: Secondary | ICD-10-CM | POA: Diagnosis not present

## 2019-05-03 ENCOUNTER — Other Ambulatory Visit
Admission: RE | Admit: 2019-05-03 | Discharge: 2019-05-03 | Disposition: A | Discharge status: Home / Self Care | Payer: Medicare HMO | Source: Ambulatory Visit | Attending: Vascular Surgery | Admitting: Vascular Surgery

## 2019-05-03 DIAGNOSIS — Z20828 Contact with and (suspected) exposure to other viral communicable diseases: Secondary | ICD-10-CM | POA: Diagnosis not present

## 2019-05-03 DIAGNOSIS — H35372 Puckering of macula, left eye: Secondary | ICD-10-CM | POA: Diagnosis not present

## 2019-05-03 DIAGNOSIS — Z01812 Encounter for preprocedural laboratory examination: Secondary | ICD-10-CM | POA: Insufficient documentation

## 2019-05-03 DIAGNOSIS — H4312 Vitreous hemorrhage, left eye: Secondary | ICD-10-CM | POA: Diagnosis not present

## 2019-05-03 DIAGNOSIS — E113512 Type 2 diabetes mellitus with proliferative diabetic retinopathy with macular edema, left eye: Secondary | ICD-10-CM | POA: Diagnosis not present

## 2019-05-04 LAB — SARS CORONAVIRUS 2 (TAT 6-24 HRS): SARS Coronavirus 2: NEGATIVE

## 2019-05-05 DIAGNOSIS — N186 End stage renal disease: Secondary | ICD-10-CM | POA: Diagnosis not present

## 2019-05-05 DIAGNOSIS — Z23 Encounter for immunization: Secondary | ICD-10-CM | POA: Diagnosis not present

## 2019-05-05 DIAGNOSIS — E785 Hyperlipidemia, unspecified: Secondary | ICD-10-CM | POA: Diagnosis not present

## 2019-05-05 DIAGNOSIS — Z992 Dependence on renal dialysis: Secondary | ICD-10-CM | POA: Diagnosis not present

## 2019-05-05 DIAGNOSIS — E119 Type 2 diabetes mellitus without complications: Secondary | ICD-10-CM | POA: Diagnosis not present

## 2019-05-05 MED ORDER — CLINDAMYCIN PHOSPHATE 300 MG/50ML IV SOLN
300.0000 mg | INTRAVENOUS | Status: AC
  Administered 2019-05-06: 300 mg via INTRAVENOUS

## 2019-05-06 ENCOUNTER — Ambulatory Visit: Payer: Medicare HMO | Admitting: Anesthesiology

## 2019-05-06 ENCOUNTER — Encounter: Payer: Self-pay | Admitting: *Deleted

## 2019-05-06 ENCOUNTER — Encounter
Admission: RE | Disposition: A | Discharge status: Home / Self Care | Payer: Self-pay | Source: Home / Self Care | Attending: Vascular Surgery

## 2019-05-06 ENCOUNTER — Other Ambulatory Visit: Payer: Self-pay

## 2019-05-06 ENCOUNTER — Ambulatory Visit
Admission: RE | Admit: 2019-05-06 | Discharge: 2019-05-06 | Disposition: A | Discharge status: Home / Self Care | Payer: Medicare HMO | Attending: Vascular Surgery | Admitting: Vascular Surgery

## 2019-05-06 DIAGNOSIS — E1143 Type 2 diabetes mellitus with diabetic autonomic (poly)neuropathy: Secondary | ICD-10-CM | POA: Diagnosis not present

## 2019-05-06 DIAGNOSIS — N185 Chronic kidney disease, stage 5: Secondary | ICD-10-CM | POA: Diagnosis not present

## 2019-05-06 DIAGNOSIS — I5030 Unspecified diastolic (congestive) heart failure: Secondary | ICD-10-CM | POA: Diagnosis not present

## 2019-05-06 DIAGNOSIS — N183 Chronic kidney disease, stage 3 (moderate): Secondary | ICD-10-CM | POA: Diagnosis not present

## 2019-05-06 DIAGNOSIS — N186 End stage renal disease: Secondary | ICD-10-CM | POA: Diagnosis not present

## 2019-05-06 DIAGNOSIS — K219 Gastro-esophageal reflux disease without esophagitis: Secondary | ICD-10-CM | POA: Insufficient documentation

## 2019-05-06 DIAGNOSIS — E782 Mixed hyperlipidemia: Secondary | ICD-10-CM | POA: Diagnosis not present

## 2019-05-06 DIAGNOSIS — I132 Hypertensive heart and chronic kidney disease with heart failure and with stage 5 chronic kidney disease, or end stage renal disease: Secondary | ICD-10-CM | POA: Diagnosis not present

## 2019-05-06 DIAGNOSIS — Z89411 Acquired absence of right great toe: Secondary | ICD-10-CM | POA: Diagnosis not present

## 2019-05-06 DIAGNOSIS — Z992 Dependence on renal dialysis: Secondary | ICD-10-CM | POA: Diagnosis not present

## 2019-05-06 DIAGNOSIS — E785 Hyperlipidemia, unspecified: Secondary | ICD-10-CM | POA: Insufficient documentation

## 2019-05-06 DIAGNOSIS — E1122 Type 2 diabetes mellitus with diabetic chronic kidney disease: Secondary | ICD-10-CM | POA: Diagnosis not present

## 2019-05-06 DIAGNOSIS — I509 Heart failure, unspecified: Secondary | ICD-10-CM | POA: Diagnosis not present

## 2019-05-06 DIAGNOSIS — I10 Essential (primary) hypertension: Secondary | ICD-10-CM | POA: Diagnosis not present

## 2019-05-06 HISTORY — PX: AV FISTULA PLACEMENT: SHX1204

## 2019-05-06 LAB — POCT I-STAT, CHEM 8
BUN: 37 mg/dL — ABNORMAL HIGH (ref 6–20)
Calcium, Ion: 1.14 mmol/L — ABNORMAL LOW (ref 1.15–1.40)
Chloride: 99 mmol/L (ref 98–111)
Creatinine, Ser: 6.5 mg/dL — ABNORMAL HIGH (ref 0.61–1.24)
Glucose, Bld: 226 mg/dL — ABNORMAL HIGH (ref 70–99)
HCT: 26 % — ABNORMAL LOW (ref 39.0–52.0)
Hemoglobin: 8.8 g/dL — ABNORMAL LOW (ref 13.0–17.0)
Potassium: 4.1 mmol/L (ref 3.5–5.1)
Sodium: 138 mmol/L (ref 135–145)
TCO2: 24 mmol/L (ref 22–32)

## 2019-05-06 LAB — TYPE AND SCREEN
ABO/RH(D): O POS
Antibody Screen: NEGATIVE

## 2019-05-06 LAB — GLUCOSE, CAPILLARY: Glucose-Capillary: 161 mg/dL — ABNORMAL HIGH (ref 70–99)

## 2019-05-06 SURGERY — INSERTION OF ARTERIOVENOUS (AV) GORE-TEX GRAFT ARM
Anesthesia: General | Laterality: Left

## 2019-05-06 MED ORDER — OXYCODONE-ACETAMINOPHEN 5-325 MG PO TABS: 1.0000 | ORAL_TABLET | Freq: Four times a day (QID) | ORAL | 0 refills | Status: DC | PRN

## 2019-05-06 MED ORDER — BUPIVACAINE LIPOSOME 1.3 % IJ SUSP
INTRAMUSCULAR | Status: AC
  Filled 2019-05-06: qty 20

## 2019-05-06 MED ORDER — HEPARIN SODIUM (PORCINE) 5000 UNIT/ML IJ SOLN
2000.0000 [IU] | Freq: Once | INTRAMUSCULAR | Status: AC
  Administered 2019-05-06: 2000 [IU] via INTRAVENOUS

## 2019-05-06 MED ORDER — BUPIVACAINE HCL (PF) 0.5 % IJ SOLN
INTRAMUSCULAR | Status: DC | PRN
  Administered 2019-05-06: 5 mL

## 2019-05-06 MED ORDER — ONDANSETRON HCL 4 MG/2ML IJ SOLN
INTRAMUSCULAR | Status: AC
  Filled 2019-05-06: qty 2

## 2019-05-06 MED ORDER — FENTANYL CITRATE (PF) 100 MCG/2ML IJ SOLN
INTRAMUSCULAR | Status: DC | PRN
  Administered 2019-05-06: 100 ug via INTRAVENOUS
  Administered 2019-05-06 (×2): 25 ug via INTRAVENOUS
  Administered 2019-05-06: 50 ug via INTRAVENOUS

## 2019-05-06 MED ORDER — HEPARIN SODIUM (PORCINE) 5000 UNIT/ML IJ SOLN
INTRAMUSCULAR | Status: AC
  Filled 2019-05-06: qty 1

## 2019-05-06 MED ORDER — ONDANSETRON HCL 4 MG/2ML IJ SOLN
INTRAMUSCULAR | Status: DC | PRN
  Administered 2019-05-06: 4 mg via INTRAVENOUS

## 2019-05-06 MED ORDER — PROPOFOL 10 MG/ML IV BOLUS
INTRAVENOUS | Status: DC | PRN
  Administered 2019-05-06: 150 mg via INTRAVENOUS

## 2019-05-06 MED ORDER — LIDOCAINE HCL (CARDIAC) PF 100 MG/5ML IV SOSY
PREFILLED_SYRINGE | INTRAVENOUS | Status: DC | PRN
  Administered 2019-05-06: 60 mg via INTRAVENOUS

## 2019-05-06 MED ORDER — FENTANYL CITRATE (PF) 100 MCG/2ML IJ SOLN
INTRAMUSCULAR | Status: AC
  Filled 2019-05-06: qty 2

## 2019-05-06 MED ORDER — MIDAZOLAM HCL 2 MG/2ML IJ SOLN
INTRAMUSCULAR | Status: AC
  Filled 2019-05-06: qty 2

## 2019-05-06 MED ORDER — PHENYLEPHRINE HCL (PRESSORS) 10 MG/ML IV SOLN
INTRAVENOUS | Status: DC | PRN
  Administered 2019-05-06 (×2): 100 ug via INTRAVENOUS

## 2019-05-06 MED ORDER — SEVOFLURANE IN SOLN
RESPIRATORY_TRACT | Status: AC
  Filled 2019-05-06: qty 250

## 2019-05-06 MED ORDER — DEXAMETHASONE SODIUM PHOSPHATE 10 MG/ML IJ SOLN: INTRAMUSCULAR | Status: DC | PRN

## 2019-05-06 MED ORDER — CHLORHEXIDINE GLUCONATE CLOTH 2 % EX PADS: 6.0000 | MEDICATED_PAD | Freq: Once | CUTANEOUS | Status: DC

## 2019-05-06 MED ORDER — HEPARIN SOD (PORK) LOCK FLUSH 100 UNIT/ML IV SOLN
INTRAVENOUS | Status: AC
  Filled 2019-05-06: qty 5

## 2019-05-06 MED ORDER — SODIUM CHLORIDE FLUSH 0.9 % IV SOLN
INTRAVENOUS | Status: AC
  Filled 2019-05-06: qty 20

## 2019-05-06 MED ORDER — BUPIVACAINE LIPOSOME 1.3 % IJ SUSP
INTRAMUSCULAR | Status: DC | PRN
  Administered 2019-05-06: 5 mL

## 2019-05-06 MED ORDER — MIDAZOLAM HCL 2 MG/2ML IJ SOLN
INTRAMUSCULAR | Status: DC | PRN
  Administered 2019-05-06: 2 mg via INTRAVENOUS

## 2019-05-06 MED ORDER — SODIUM CHLORIDE 0.9 % IV SOLN
INTRAVENOUS | Status: DC
  Administered 2019-05-06: 12:00:00 via INTRAVENOUS

## 2019-05-06 MED ORDER — ROCURONIUM BROMIDE 50 MG/5ML IV SOLN
INTRAVENOUS | Status: AC
  Filled 2019-05-06: qty 1

## 2019-05-06 MED ORDER — ROCURONIUM BROMIDE 100 MG/10ML IV SOLN
INTRAVENOUS | Status: DC | PRN
  Administered 2019-05-06: 35 mg via INTRAVENOUS
  Administered 2019-05-06: 15 mg via INTRAVENOUS

## 2019-05-06 MED ORDER — BUPIVACAINE HCL (PF) 0.5 % IJ SOLN
INTRAMUSCULAR | Status: AC
  Filled 2019-05-06: qty 30

## 2019-05-06 MED ORDER — CLINDAMYCIN PHOSPHATE 300 MG/50ML IV SOLN
INTRAVENOUS | Status: AC
  Filled 2019-05-06: qty 50

## 2019-05-06 MED ORDER — SUGAMMADEX SODIUM 200 MG/2ML IV SOLN
INTRAVENOUS | Status: DC | PRN
  Administered 2019-05-06: 300 mg via INTRAVENOUS

## 2019-05-06 MED ORDER — PROPOFOL 10 MG/ML IV BOLUS
INTRAVENOUS | Status: AC
  Filled 2019-05-06: qty 20

## 2019-05-06 MED ORDER — LIDOCAINE HCL (PF) 2 % IJ SOLN
INTRAMUSCULAR | Status: AC
  Filled 2019-05-06: qty 10

## 2019-05-06 SURGICAL SUPPLY — 60 items
APPLIER CLIP 11 MED OPEN (CLIP)
APPLIER CLIP 9.375 SM OPEN (CLIP)
BAG COUNTER SPONGE EZ (MISCELLANEOUS) ×2 IMPLANT
BAG DECANTER FOR FLEXI CONT (MISCELLANEOUS) ×2 IMPLANT
BLADE SURG SZ11 CARB STEEL (BLADE) ×2 IMPLANT
BOOT SUTURE AID YELLOW STND (SUTURE) ×2 IMPLANT
BRUSH SCRUB EZ  4% CHG (MISCELLANEOUS) ×1
BRUSH SCRUB EZ 4% CHG (MISCELLANEOUS) ×1 IMPLANT
CANISTER SUCT 1200ML W/VALVE (MISCELLANEOUS) ×2 IMPLANT
CHLORAPREP W/TINT 26 (MISCELLANEOUS) ×2 IMPLANT
CLIP APPLIE 11 MED OPEN (CLIP) IMPLANT
CLIP APPLIE 9.375 SM OPEN (CLIP) IMPLANT
COVER WAND RF STERILE (DRAPES) ×2 IMPLANT
DERMABOND ADVANCED (GAUZE/BANDAGES/DRESSINGS) ×1
DERMABOND ADVANCED .7 DNX12 (GAUZE/BANDAGES/DRESSINGS) ×1 IMPLANT
DRESSING SURGICEL FIBRLLR 1X2 (HEMOSTASIS) ×1 IMPLANT
DRSG SURGICEL FIBRILLAR 1X2 (HEMOSTASIS) ×2
ELECT CAUTERY BLADE 6.4 (BLADE) ×2 IMPLANT
ELECT REM PT RETURN 9FT ADLT (ELECTROSURGICAL) ×2
ELECTRODE REM PT RTRN 9FT ADLT (ELECTROSURGICAL) ×1 IMPLANT
GLOVE BIO SURGEON STRL SZ7 (GLOVE) ×4 IMPLANT
GLOVE INDICATOR 7.5 STRL GRN (GLOVE) ×5 IMPLANT
GLOVE SURG SYN 8.0 (GLOVE) ×2 IMPLANT
GLOVE SURG SYN 8.0 PF PI (GLOVE) ×1 IMPLANT
GOWN STRL REUS W/ TWL LRG LVL3 (GOWN DISPOSABLE) ×2 IMPLANT
GOWN STRL REUS W/ TWL XL LVL3 (GOWN DISPOSABLE) ×1 IMPLANT
GOWN STRL REUS W/TWL LRG LVL3 (GOWN DISPOSABLE) ×3
GOWN STRL REUS W/TWL XL LVL3 (GOWN DISPOSABLE) ×1
GRAFT PROPATEN STD WALL 4 7X45 (Vascular Products) ×2 IMPLANT
IV NS 500ML (IV SOLUTION) ×1
IV NS 500ML BAXH (IV SOLUTION) ×1 IMPLANT
KIT TURNOVER KIT A (KITS) ×2 IMPLANT
LABEL OR SOLS (LABEL) ×2 IMPLANT
LOOP RED MAXI  1X406MM (MISCELLANEOUS) ×2
LOOP VESSEL MAXI 1X406 RED (MISCELLANEOUS) ×1 IMPLANT
LOOP VESSEL MINI 0.8X406 BLUE (MISCELLANEOUS) ×2 IMPLANT
LOOPS BLUE MINI 0.8X406MM (MISCELLANEOUS) ×4
NDL FILTER BLUNT 18X1 1/2 (NEEDLE) ×1 IMPLANT
NEEDLE FILTER BLUNT 18X 1/2SAF (NEEDLE) ×1
NEEDLE FILTER BLUNT 18X1 1/2 (NEEDLE) ×1 IMPLANT
NS IRRIG 500ML POUR BTL (IV SOLUTION) ×2 IMPLANT
PACK EXTREMITY ARMC (MISCELLANEOUS) ×2 IMPLANT
PAD PREP 24X41 OB/GYN DISP (PERSONAL CARE ITEMS) ×2 IMPLANT
STOCKINETTE 48X4 2 PLY STRL (GAUZE/BANDAGES/DRESSINGS) ×1 IMPLANT
STOCKINETTE STRL 4IN 9604848 (GAUZE/BANDAGES/DRESSINGS) ×2 IMPLANT
SUT GTX CV-6 30 (SUTURE) ×2 IMPLANT
SUT MNCRL+ 5-0 UNDYED PC-3 (SUTURE) ×1 IMPLANT
SUT MONOCRYL 5-0 (SUTURE) ×1
SUT PROLENE 6 0 BV (SUTURE) ×4 IMPLANT
SUT SILK 2 0 (SUTURE) ×1
SUT SILK 2 0 SH (SUTURE) ×1 IMPLANT
SUT SILK 2-0 18XBRD TIE 12 (SUTURE) ×1 IMPLANT
SUT SILK 3 0 (SUTURE) ×1
SUT SILK 3-0 18XBRD TIE 12 (SUTURE) ×1 IMPLANT
SUT SILK 4 0 (SUTURE) ×1
SUT SILK 4-0 18XBRD TIE 12 (SUTURE) ×1 IMPLANT
SUT VIC AB 3-0 SH 27 (SUTURE) ×2
SUT VIC AB 3-0 SH 27X BRD (SUTURE) ×2 IMPLANT
SYR 20ML LL LF (SYRINGE) ×2 IMPLANT
SYR 3ML LL SCALE MARK (SYRINGE) ×2 IMPLANT

## 2019-05-06 NOTE — Anesthesia Postprocedure Evaluation (Signed)
Anesthesia Post Note  Patient: Timothy Clark  Procedure(s) Performed: INSERTION OF ARTERIOVENOUS (AV) GORE-TEX GRAFT ARM ( FOREARM LOOP ) (Left )  Patient location during evaluation: PACU Anesthesia Type: General Level of consciousness: awake and alert Pain management: pain level controlled Vital Signs Assessment: post-procedure vital signs reviewed and stable Respiratory status: spontaneous breathing, nonlabored ventilation, respiratory function stable and patient connected to nasal cannula oxygen Cardiovascular status: blood pressure returned to baseline and stable Postop Assessment: no apparent nausea or vomiting Anesthetic complications: no     Last Vitals:  Vitals:   05/06/19 1611 05/06/19 1613  BP: 112/67 112/67  Pulse: 71 71  Resp: 15 15  Temp: (!) 36.2 C (!) 36.2 C  SpO2:  98%    Last Pain:  Vitals:   05/06/19 1613  TempSrc: Oral  PainSc: 0-No pain                 Precious Haws Jonte Wollam

## 2019-05-06 NOTE — Op Note (Signed)
OPERATIVE NOTE   PROCEDURE: Left forearm loop arteriovenous graft with 4 to 7 mm tapered propatent graft  PRE-OPERATIVE DIAGNOSIS: 1. ESRD 2.  Hypertension  POST-OPERATIVE DIAGNOSIS: same as above  SURGEON: Katha Cabal, MD  ASSISTANT(S): Ms. Hezzie Bump  ANESTHESIA: General  ESTIMATED BLOOD LOSS: 75 cc  FINDING(S): 1. none  SPECIMEN(S):  none  INDICATIONS:   Timothy Clark is a 48 y.o. male who  presents with ESRD and need for permanent dialysis access.  Risk, benefits, and alternatives to access surgery were discussed.  The difference between AV fistulas and AV grafts were discussed.  The patient is aware the risks include but are not limited to: bleeding, infection, steal syndrome, nerve damage, ischemic monomelic neuropathy, failure to mature, and need for additional procedures.  The patient is aware of the risks and elects to proceed forward.  DESCRIPTION: After full informed written consent was obtained from the patient, the patient was brought back to the operating room and placed supine upon the operating table.  The patientwas given IV antibiotics prior to proceeding.    A first assistant is required in order to allow for a safe and more efficient operation.  Duties include retraction of tissues to allow for optimal exposure, assisting with suture ligation of vessels as well as maintaining a clear field of view with suction as needed.  Further duties include assisting with patient positioning during the surgery as well as wound closure.  I believe that this procedure requires a first assistant in order for it to be performed at a level in keeping with the high standards of this institution.  After obtaining adequate general anesthesia, the patient was prepped and draped in standard fashion.    I made a transverse incision at the antecubital fossa and dissected down through the subcutaneous tissue and fascia.  The brachial artery was dissected proximally and  distally and vessel loops placed for control.  The artery was patent and adequate sized for AV graft creation.  One of the brachial veins was in close proximity and was found to be patent and of adequate size for AV graft creation.  I dissected it out and placed a vessel loop around the vein, and would later control this with bulldog clamps.  I then obtained a 4 to 7 mm tapered propatent graft, which I stretched to full length to help determine the apex of this looped forearm arteriovenous graft.  I made a transverse incision at the determined apex site and dissected down out a pocket.  I then took a curved metal tunneler and dissected from the antecubital incision up to the apical incision.  I delivered the graft through the metal tunnel, taking care to maintain the orientation of the graft.  I then tunneled from the apex to the antecubital incision, leaving the metal tunnel in place and delivered the remainder of the graft through the tunnel.   I then gave the patient 3000 units of heparin to gain some anticoagulation, and allowed this to circulate for several minutes. I placed the brachial artery under tension proximally and distally with vessel loops.  I made an arteriotomy with a 11 blade and extended it with a Potts scissor for.  The graft was then cut and beveled to match the arteriotomy.  The graft was sewn to the artery with a running CV-6 Goretex suture, in a end-to-side configuration.  Prior to completing the anastomosis, I allowed the artery to backbleed from both ends.  I then  released the vessel loops on the inflow and allowed the artery to decompress through the graft. There was good pulsatile bleeding through this graft.  I then clamped the graft near its arterial anastomosis.  I then suctioned out all the blood in the graft and instilled heparinized saline into the graft.   At this point, I pulled the graft to appropriate tension to remove any redundancy.  I then used the bulldog clamps to control  the vein.  An anterior wall venotomy was then made with an 11 blade and extended with Potts scissors.  The graft was then cut an beveled to match the venotomy.  The venous anastomosis was created with a CV-6 suture. Prior to completing this anastomosis, I allowed the vein to back bleed and then I also allowed the artery to bleed in an antegrade fashion.  I completed this anastomosis in the usual fashion, and irrigated out the wound with sterile saline.  I released all clamps.  There was excellent flow in the graft, and a palpable pulse in the artery beyond the graft. At this point, I washed out the antecubital wound.  I placed Surgicel and Evicel topical hemostatic agents and hemostasis was complete. The subcutaneous tissue was reapproximated in all incisions with a running stitch of 3-0 Vicryl.  The skin was then reapproximated in all incisions with a running subcuticular 4-0 Monocryl.  The skin was then cleaned and dried at all incisions, and Dermabond used to reinforce the skin closure.  The patient was taken to the recovery room in stable condition having tolerated the procedure well.  COMPLICATIONS: none  CONDITION: stable  Timothy Clark  05/06/2019, 3:25 PM                  This note was created with Dragon Medical transcription system. Any errors in dictation are purely unintentional.

## 2019-05-06 NOTE — Transfer of Care (Signed)
Immediate Anesthesia Transfer of Care Note  Patient: Timothy Clark  Procedure(s) Performed: INSERTION OF ARTERIOVENOUS (AV) GORE-TEX GRAFT ARM ( FOREARM LOOP ) (Left )  Patient Location: PACU  Anesthesia Type:General  Level of Consciousness: awake, alert  and patient cooperative  Airway & Oxygen Therapy: Patient Spontanous Breathing and Patient connected to face mask oxygen  Post-op Assessment: Report given to RN and Post -op Vital signs reviewed and stable  Post vital signs: Reviewed and stable  Last Vitals:  Vitals Value Taken Time  BP 152/75 05/06/19 1510  Temp    Pulse 71 05/06/19 1513  Resp 13 05/06/19 1512  SpO2 100 % 05/06/19 1513  Vitals shown include unvalidated device data.  Last Pain:  Vitals:   05/06/19 0924  TempSrc: Temporal  PainSc: 0-No pain         Complications: No apparent anesthesia complications

## 2019-05-06 NOTE — Progress Notes (Signed)
Noted bruit and thrill to left forearm graft

## 2019-05-06 NOTE — Anesthesia Post-op Follow-up Note (Signed)
Anesthesia QCDR form completed.        

## 2019-05-06 NOTE — Anesthesia Procedure Notes (Signed)
Procedure Name: Intubation Date/Time: 05/06/2019 12:30 PM Performed by: Jonna Clark, CRNA Pre-anesthesia Checklist: Patient identified, Emergency Drugs available, Suction available and Patient being monitored Patient Re-evaluated:Patient Re-evaluated prior to induction Oxygen Delivery Method: Circle system utilized Preoxygenation: Pre-oxygenation with 100% oxygen Induction Type: IV induction Ventilation: Mask ventilation without difficulty Laryngoscope Size: McGraph and 4 Grade View: Grade I Tube type: Oral Tube size: 7.5 mm Number of attempts: 1 Airway Equipment and Method: Stylet and Oral airway Placement Confirmation: ETT inserted through vocal cords under direct vision,  positive ETCO2 and breath sounds checked- equal and bilateral Secured at: 23 cm Tube secured with: Tape Dental Injury: Teeth and Oropharynx as per pre-operative assessment

## 2019-05-06 NOTE — H&P (Signed)
[facility] Admission History & Physical  MRN : [id number]  Timothy Clark is a 48 y.o. ([birth year]) male who presents with chief complaint of No chief complaint on file. [name]  History of Present Illness:    The patient is seen for evaluation of dialysis access.  The patient has a history of multiple failed accesses.  There have been accesses in both arms and in the thighs.    Current access is via a catheter which is functioning poorly.  There have been several episodes of catheter infection.  The patient denies amaurosis fugax or recent TIA symptoms. There are no recent neurological changes noted. The patient denies claudication symptoms or rest pain symptoms. The patient denies history of DVT, PE or superficial thrombophlebitis. The patient denies recent episodes of angina or shortness of breath.    Current Facility-Administered Medications  Medication Dose Route Frequency Provider Last Rate Last Dose  . 0.9 %  sodium chloride infusion   Intravenous Continuous Kephart, Jamse Mead, MD      . Chlorhexidine Gluconate Cloth 2 % PADS 6 each  6 each Topical Once Kris Hartmann, NP       And  . Chlorhexidine Gluconate Cloth 2 % PADS 6 each  6 each Topical Once Kris Hartmann, NP      . clindamycin (CLEOCIN) 300 MG/50ML IVPB           . clindamycin (CLEOCIN) IVPB 300 mg  300 mg Intravenous On Call to OR Eulogio Ditch E, NP      . sodium chloride flush 0.9 % injection             Past Medical History:  Diagnosis Date  . Anxiety   . CHF (congestive heart failure) ([place])   . Chronic abdominal pain   . Depression   . Esophagitis   . Eye hemorrhage   . Gastritis   . Gastroparesis   . GERD (gastroesophageal reflux disease)   . Glaucoma   . Helicobacter pylori (H. pylori) infection 2016   ERADICATION DOCUMENTED VIA STOOL TEST in AUG 2016 at [facility]  . Hiccups   . Hypertension   . Nausea and vomiting    chronic, recurrent  . Neuropathy   . Renal  insufficiency   . Type 2 diabetes mellitus with complications (HCC)    diagnosed around age 58    Past Surgical History:  Procedure Laterality Date  . AMPUTATION TOE Right    great toe  . AV FISTULA INSERTION W/ RF MAGNETIC GUIDANCE Left 04/06/2019   Procedure: AV FISTULA INSERTION W/RF MAGNETIC GUIDANCE;  Surgeon: Katha Cabal, MD;  Location: [facility];  Service: Cardiovascular;  Laterality: Left;  . CATARACT EXTRACTION W/PHACO Left 05/19/2016   Procedure: CATARACT EXTRACTION PHACO AND INTRAOCULAR LENS PLACEMENT LEFT EYE;  Surgeon: Tonny Branch, MD;  [facility]: [facility];  Service: Ophthalmology;  Laterality: Left;  CDE: 7.30  . CATARACT EXTRACTION W/PHACO Right 06/23/2016   Procedure: CATARACT EXTRACTION PHACO AND INTRAOCULAR LENS PLACEMENT RIGHT EYE CDE=9.87;  Surgeon: Tonny Branch, MD;  Location: [facility];  Service: Ophthalmology;  Laterality: Right;  right  . ESOPHAGOGASTRODUODENOSCOPY  2015   Dr. Britta Mccreedy  . ESOPHAGOGASTRODUODENOSCOPY N/A 10/26/2014   RMR: Distal esophagititis-likely reflux related although an element of pill induced injuruy no exclueded  status post biopsy. Diffusely abnormal gastric mucosa of uncertain signigicane -status post gastric biopsy. Focal area of excoriation in the cardia most consistant with a trauma of heaving.   [name]  ESOPHAGOGASTRODUODENOSCOPY  08/2015   [facility]: mild esophagitis and old gastric contents  . EYE SURGERY  2015   stent placed to left eye  . EYE SURGERY    . INCISION AND DRAINAGE OF WOUND Right 07/16/2017   Procedure: IRRIGATION AND DEBRIDEMENT OF SOFT TISSUE OF ULCERATION RIGHT FOOT;  Surgeon: Caprice Beaver, DPM;  Location: [facility];  Service: Podiatry;  Laterality: Right;    Social History Social History   Tobacco Use  . Smoking status: Never Smoker  . Smokeless tobacco: Never Used  Substance Use Topics  . Alcohol use: No    Alcohol/week: 0.0 standard drinks  . Drug use: No    Family History Family History  Problem  Relation Age of Onset  . Ovarian cancer Mother   . Heart attack Father   . Colon polyps Father   . Cervical cancer Sister   . Diabetes Sister   . Colon cancer Neg Hx   No family history of bleeding/clotting disorders, porphyria or autoimmune disease   Allergies  Allergen Reactions  . Donnatal [Phenobarbital-Belladonna Alk] Anaphylaxis and Other (See Comments)    Reaction to GI cocktail  . Fish Allergy Anaphylaxis, Hives and Rash  . Haldol [Haloperidol Lactate] Other (See Comments)    Chest Pain  . Maalox [Calcium Carbonate Antacid] Anaphylaxis and Other (See Comments)    Reaction to GI cocktail  . Marinol [Dronabinol] Nausea And Vomiting and Other (See Comments)    Caused worsening vomiting.   . Aspirin Hives and Itching  . Bactrim [Sulfamethoxazole-Trimethoprim] Itching  . Keflex [Cephalexin] Hives, Itching and Nausea And Vomiting  . Penicillins Hives, Itching and Other (See Comments)    Has patient had a PCN reaction causing immediate rash, facial/tongue/throat swelling, SOB or lightheadedness with hypotension: yes Has patient had a PCN reaction causing severe rash involving mucus membranes or skin necrosis: yes Has patient had a PCN reaction that required hospitalization no Has patient had a PCN reaction occurring within the last 10 years: yes If all of the above answers are "NO", then may proceed with Cephalospor     REVIEW OF SYSTEMS (Negative unless checked)  Constitutional: '[]' Weight loss  '[]' Fever  '[]' Chills Cardiac: '[]' Chest pain   '[]' Chest pressure   '[]' Palpitations   '[]' Shortness of breath when laying flat   '[]' Shortness of breath at rest   '[]' Shortness of breath with exertion. Vascular:  '[]' Pain in legs with walking   '[]' Pain in legs at rest   '[]' Pain in legs when laying flat   '[]' Claudication   '[]' Pain in feet when walking  '[]' Pain in feet at rest  '[]' Pain in feet when laying flat   '[]' History of DVT   '[]' Phlebitis   '[]' Swelling in legs   '[]' Varicose veins   '[]' Non-healing  ulcers Pulmonary:   '[]' Uses home oxygen   '[]' Productive cough   '[]' Hemoptysis   '[]' Wheeze  '[]' COPD   '[]' Asthma Neurologic:  '[]' Dizziness  '[]' Blackouts   '[]' Seizures   '[]' History of stroke   '[]' History of TIA  '[]' Aphasia   '[]' Temporary blindness   '[]' Dysphagia   '[]' Weakness or numbness in arms   '[]' Weakness or numbness in legs Musculoskeletal:  '[]' Arthritis   '[]' Joint swelling   '[]' Joint pain   '[]' Low back pain Hematologic:  '[]' Easy bruising  '[]' Easy bleeding   '[]' Hypercoagulable state   '[]' Anemic  '[]' Hepatitis Gastrointestinal:  '[]' Blood in stool   '[]' Vomiting blood  '[]' Gastroesophageal reflux/heartburn   '[]' Difficulty swallowing. Genitourinary:  '[x]' Chronic kidney disease   '[]' Difficult urination  '[]' Frequent urination  '[]' Burning with urination   '[]'   Blood in urine Skin:  '[]' Rashes   '[]' Ulcers   '[]' Wounds Psychological:  '[]' History of anxiety   '[]'  History of major depression.  Physical Examination  Vitals:   05/06/19 0924  BP: 126/68  Pulse: 71  Resp: 18  Temp: 97.9 F (36.6 C)  TempSrc: Temporal  SpO2: 99%  Weight: 135.5 kg  Height: 6' (1.829 m)   Body mass index is 40.51 kg/m. Gen: WD/WN, NAD Head: [place]/AT, No temporalis wasting.  Ear/Nose/Throat: Hearing grossly intact, nares w/o erythema or drainage, oropharynx w/o Erythema/Exudate, Eyes: Sclera non-icteric, conjunctiva clear Neck: Supple, no nuchal rigidity.  No JVD.  Pulmonary:  Good air movement, no increased work of respiration or use of accessory muscles  Cardiac: RRR, normal S1, S2, no Murmurs, rubs or gallops. Vascular:  Right IJ tunneled catheter Vessel Right Left  Radial Palpable Palpable  Brachial Palpable Palpable  Carotid Palpable, without bruit Palpable, without bruit  Gastrointestinal: soft, non-tender/non-distended. No guarding/reflex. No masses, surgical incisions, or scars. Musculoskeletal: M/S 5/5 throughout.  No deformity or atrophy.  1+ edema Neurologic: Sensation grossly intact in extremities.  Symmetrical.  Speech is fluent. Motor exam as  listed above. Psychiatric: Judgment intact, Mood & affect appropriate for pt's clinical situation. Dermatologic: No rashes or ulcers noted.  No cellulitis or open wounds. Lymph : No Cervical, Axillary, or Inguinal lymphadenopathy.      CBC Lab Results  Component Value Date   WBC 5.3 04/27/2019   HGB 8.8 (L) 05/06/2019   HCT 26.0 (L) 05/06/2019   MCV 95.2 04/27/2019   PLT 123 (L) 04/27/2019    BMET    Component Value Date/Time   NA 138 05/06/2019 1020   K 4.1 05/06/2019 1020   CL 99 05/06/2019 1020   CO2 25 04/27/2019 1110   GLUCOSE 226 (H) 05/06/2019 1020   BUN 37 (H) 05/06/2019 1020   CREATININE 6.50 (H) 05/06/2019 1020   CREATININE 1.20 06/22/2017 1436   CALCIUM 8.7 (L) 04/27/2019 1110   GFRNONAA 11 (L) 04/27/2019 1110   GFRAA 12 (L) 04/27/2019 1110   Estimated Creatinine Clearance: 19.6 mL/min (A) (by C-G formula based on SCr of 6.5 mg/dL (H)).  COAG Lab Results  Component Value Date   INR 1.1 04/27/2019   INR 1.2 03/29/2019   INR 1.1 10/25/2018    Radiology No results found.    Assessment/Plan 1.  End-stage renal disease requiring hemodialysis:  Patient will continue dialysis therapy without further interruption if a successful thrombectomy is not achieved then catheter will be placed. Dialysis has already been arranged since the patient missed their previous session 2.  Diabetes mellitus:  Glucose will be monitored and oral medications been held this morning once the patient has undergone the patient's procedure po intake will be reinitiated and again Accu-Cheks will be used to assess the blood glucose level and treat as needed. The patient will be restarted on the patient's usual hypoglycemic regime 3.  Hypertension:  Patient will continue medical management; nephrology is following no changes in oral medications. 4.  Hyperlipidemia:  Continue statin as ordered and reviewed, no changes at this time 5.   GERD:  Continue PPI as already ordered, this  medication has been reviewed and there are no changes at this time.  Avoidence of caffeine and alcohol.  Moderate elevation of the head of the bed     [name], MD  05/06/2019 11:57 AM

## 2019-05-06 NOTE — Discharge Instructions (Signed)

## 2019-05-06 NOTE — Anesthesia Preprocedure Evaluation (Addendum)
Anesthesia Evaluation  Patient identified by MRN, date of birth, ID band Patient awake    Reviewed: Allergy & Precautions, H&P , NPO status , Patient's Chart, lab work & pertinent test results  Airway Mallampati: III  TM Distance: >3 FB     Dental  (+) Edentulous Upper, Chipped, Poor Dentition, Missing   Pulmonary neg pulmonary ROS, neg shortness of breath, neg COPD, neg recent URI, Not current smoker,           Cardiovascular hypertension, (-) angina+ Peripheral Vascular Disease and +CHF (HFpEF.  Grade 2 diastolic dysfunction)  (-) Cardiac Stents + dysrhythmias (prolonged QT)   EKG prior to AV fistula creation last month showed evidence of a septal infarct which was not seen on EKG in March.  Pt reports he saw cardiology and was cleared prior to his last surgery.  Pt denies any recent CP or SOB.  No issues following his last surgery.  METS>4.    Neuro/Psych PSYCHIATRIC DISORDERS Anxiety Depression CVA    GI/Hepatic Neg liver ROS, GERD  Controlled,  Endo/Other  diabetes  Renal/GU ESRF and DialysisRenal disease     Musculoskeletal   Abdominal   Peds  Hematology  (+) Blood dyscrasia, anemia ,   Anesthesia Other Findings Past Medical History: No date: Anxiety No date: CHF (congestive heart failure) (HCC) No date: Chronic abdominal pain No date: Depression No date: Esophagitis No date: Eye hemorrhage No date: Gastritis No date: Gastroparesis No date: GERD (gastroesophageal reflux disease) No date: Glaucoma [id number]: Helicobacter pylori (H. pylori) infection     Comment:  ERADICATION DOCUMENTED VIA STOOL TEST in AUG 2016 at               [facility] No date: Hiccups No date: Hypertension No date: Nausea and vomiting     Comment:  chronic, recurrent No date: Neuropathy No date: Renal insufficiency No date: Type 2 diabetes mellitus with complications (HCC)     Comment:  diagnosed around age 57  Past Surgical History: No  date: AMPUTATION TOE; Right     Comment:  great toe 04/06/2019: AV FISTULA INSERTION W/ RF MAGNETIC GUIDANCE; Left     Comment:  Procedure: AV FISTULA INSERTION W/RF MAGNETIC GUIDANCE;               Surgeon: Katha Cabal, MD;  Location: [facility];  Service: Cardiovascular;  Laterality: Left; 05/19/2016: CATARACT EXTRACTION W/PHACO; Left     Comment:  Procedure: CATARACT EXTRACTION PHACO AND INTRAOCULAR               LENS PLACEMENT LEFT EYE;  Surgeon: Tonny Branch, MD;                Location: [facility];  Service: Ophthalmology;  Laterality:               Left;  CDE: 7.30 06/23/2016: CATARACT EXTRACTION W/PHACO; Right     Comment:  Procedure: CATARACT EXTRACTION PHACO AND INTRAOCULAR               LENS PLACEMENT RIGHT EYE CDE=9.87;  Surgeon: Tonny Branch,               MD;  Location: [facility];  Service: Ophthalmology;                Laterality: Right;  right 2015: ESOPHAGOGASTRODUODENOSCOPY     Comment:  Dr. Britta Mccreedy 10/26/2014: ESOPHAGOGASTRODUODENOSCOPY; N/A  Comment:  RMR: Distal esophagititis-likely reflux related although              an element of pill induced injuruy no exclueded  status               post biopsy. Diffusely abnormal gastric mucosa of               uncertain signigicane -status post gastric biopsy. Focal               area of excoriation in the cardia most consistant with a               trauma of heaving.  08/2015: ESOPHAGOGASTRODUODENOSCOPY     Comment:  [facility]: mild esophagitis and old gastric contents 2015: EYE SURGERY     Comment:  stent placed to left eye No date: EYE SURGERY 07/16/2017: INCISION AND DRAINAGE OF WOUND; Right     Comment:  Procedure: IRRIGATION AND DEBRIDEMENT OF SOFT TISSUE OF               ULCERATION RIGHT FOOT;  Surgeon: Caprice Beaver, DPM;              Location: [facility];  Service: Podiatry;  Laterality: Right;     Reproductive/Obstetrics negative OB ROS                             Anesthesia Physical Anesthesia Plan  ASA: III  Anesthesia Plan: General ETT   Post-op Pain Management:    Induction:   PONV Risk Score and Plan: Ondansetron, Dexamethasone, Midazolam and Treatment may vary due to age or medical condition  Airway Management Planned:   Additional Equipment:   Intra-op Plan:   Post-operative Plan:   Informed Consent: I have reviewed the patients History and Physical, chart, labs and discussed the procedure including the risks, benefits and alternatives for the proposed anesthesia with the patient or authorized representative who has indicated his/her understanding and acceptance.     Dental Advisory Given  Plan Discussed with: Anesthesiologist and CRNA  Anesthesia Plan Comments: (Will proceed with surgery per ACC/AHA guidelines for perioperative cardiovascular evaluation and management of patients undergoing noncardiac surgery.)       Anesthesia Quick Evaluation

## 2019-05-07 DIAGNOSIS — Z23 Encounter for immunization: Secondary | ICD-10-CM | POA: Diagnosis not present

## 2019-05-07 DIAGNOSIS — N186 End stage renal disease: Secondary | ICD-10-CM | POA: Diagnosis not present

## 2019-05-07 DIAGNOSIS — Z992 Dependence on renal dialysis: Secondary | ICD-10-CM | POA: Diagnosis not present

## 2019-05-09 ENCOUNTER — Encounter: Payer: Self-pay | Admitting: Vascular Surgery

## 2019-05-10 DIAGNOSIS — N186 End stage renal disease: Secondary | ICD-10-CM | POA: Diagnosis not present

## 2019-05-10 DIAGNOSIS — Z992 Dependence on renal dialysis: Secondary | ICD-10-CM | POA: Diagnosis not present

## 2019-05-10 DIAGNOSIS — Z23 Encounter for immunization: Secondary | ICD-10-CM | POA: Diagnosis not present

## 2019-05-11 DIAGNOSIS — N186 End stage renal disease: Secondary | ICD-10-CM | POA: Diagnosis not present

## 2019-05-11 DIAGNOSIS — Z992 Dependence on renal dialysis: Secondary | ICD-10-CM | POA: Diagnosis not present

## 2019-05-12 DIAGNOSIS — Z992 Dependence on renal dialysis: Secondary | ICD-10-CM | POA: Diagnosis not present

## 2019-05-12 DIAGNOSIS — E119 Type 2 diabetes mellitus without complications: Secondary | ICD-10-CM | POA: Diagnosis not present

## 2019-05-12 DIAGNOSIS — E785 Hyperlipidemia, unspecified: Secondary | ICD-10-CM | POA: Diagnosis not present

## 2019-05-12 DIAGNOSIS — E782 Mixed hyperlipidemia: Secondary | ICD-10-CM | POA: Diagnosis not present

## 2019-05-12 DIAGNOSIS — I10 Essential (primary) hypertension: Secondary | ICD-10-CM | POA: Diagnosis not present

## 2019-05-12 DIAGNOSIS — N186 End stage renal disease: Secondary | ICD-10-CM | POA: Diagnosis not present

## 2019-05-12 DIAGNOSIS — E1122 Type 2 diabetes mellitus with diabetic chronic kidney disease: Secondary | ICD-10-CM | POA: Diagnosis not present

## 2019-05-12 DIAGNOSIS — Z23 Encounter for immunization: Secondary | ICD-10-CM | POA: Diagnosis not present

## 2019-05-14 DIAGNOSIS — Z23 Encounter for immunization: Secondary | ICD-10-CM | POA: Diagnosis not present

## 2019-05-14 DIAGNOSIS — Z992 Dependence on renal dialysis: Secondary | ICD-10-CM | POA: Diagnosis not present

## 2019-05-14 DIAGNOSIS — N186 End stage renal disease: Secondary | ICD-10-CM | POA: Diagnosis not present

## 2019-05-16 DIAGNOSIS — Z992 Dependence on renal dialysis: Secondary | ICD-10-CM | POA: Diagnosis not present

## 2019-05-16 DIAGNOSIS — N186 End stage renal disease: Secondary | ICD-10-CM | POA: Diagnosis not present

## 2019-05-16 DIAGNOSIS — Z23 Encounter for immunization: Secondary | ICD-10-CM | POA: Diagnosis not present

## 2019-05-18 DIAGNOSIS — N186 End stage renal disease: Secondary | ICD-10-CM | POA: Diagnosis not present

## 2019-05-18 DIAGNOSIS — Z992 Dependence on renal dialysis: Secondary | ICD-10-CM | POA: Diagnosis not present

## 2019-05-18 DIAGNOSIS — Z23 Encounter for immunization: Secondary | ICD-10-CM | POA: Diagnosis not present

## 2019-05-20 ENCOUNTER — Other Ambulatory Visit (INDEPENDENT_AMBULATORY_CARE_PROVIDER_SITE_OTHER): Payer: Self-pay | Admitting: Vascular Surgery

## 2019-05-20 DIAGNOSIS — N186 End stage renal disease: Secondary | ICD-10-CM

## 2019-05-20 DIAGNOSIS — Z23 Encounter for immunization: Secondary | ICD-10-CM | POA: Diagnosis not present

## 2019-05-20 DIAGNOSIS — Z95828 Presence of other vascular implants and grafts: Secondary | ICD-10-CM

## 2019-05-20 DIAGNOSIS — Z9889 Other specified postprocedural states: Secondary | ICD-10-CM

## 2019-05-20 DIAGNOSIS — Z992 Dependence on renal dialysis: Secondary | ICD-10-CM | POA: Diagnosis not present

## 2019-05-23 ENCOUNTER — Ambulatory Visit (INDEPENDENT_AMBULATORY_CARE_PROVIDER_SITE_OTHER): Payer: Medicare HMO | Admitting: Vascular Surgery

## 2019-05-23 ENCOUNTER — Ambulatory Visit (INDEPENDENT_AMBULATORY_CARE_PROVIDER_SITE_OTHER): Payer: Medicare HMO

## 2019-05-23 ENCOUNTER — Encounter (INDEPENDENT_AMBULATORY_CARE_PROVIDER_SITE_OTHER): Payer: Self-pay | Admitting: Vascular Surgery

## 2019-05-23 ENCOUNTER — Other Ambulatory Visit: Payer: Self-pay

## 2019-05-23 VITALS — BP 124/70 | HR 71 | Resp 16 | Wt 301.0 lb

## 2019-05-23 DIAGNOSIS — N186 End stage renal disease: Secondary | ICD-10-CM | POA: Diagnosis not present

## 2019-05-23 DIAGNOSIS — N185 Chronic kidney disease, stage 5: Secondary | ICD-10-CM

## 2019-05-23 DIAGNOSIS — Z9889 Other specified postprocedural states: Secondary | ICD-10-CM | POA: Diagnosis not present

## 2019-05-23 DIAGNOSIS — Z95828 Presence of other vascular implants and grafts: Secondary | ICD-10-CM

## 2019-05-23 NOTE — Progress Notes (Signed)
Patient ID: Timothy Clark, male   DOB: 02-22-1971, 48 y.o.   MRN: [id number]  No chief complaint on file.   HPI Timothy Clark is a 48 y.o. male.    Patient returns to the office s/p Left forearm loop arteriovenous graft with 4 to 7 mm tapered propatent graft on 05/06/2019.  He denies significant pain in the left forearm.  No fever or chills  Duplex ultrasound of the AV access shows a patent access with uniform velocities.  No focal hemodynamically significant stenosis.    Past Medical History:  Diagnosis Date  . Anxiety   . CHF (congestive heart failure) ([place])   . Chronic abdominal pain   . Depression   . Esophagitis   . Eye hemorrhage   . Gastritis   . Gastroparesis   . GERD (gastroesophageal reflux disease)   . Glaucoma   . Helicobacter pylori (H. pylori) infection 2016   ERADICATION DOCUMENTED VIA STOOL TEST in AUG 2016 at [facility]  . Hiccups   . Hypertension   . Nausea and vomiting    chronic, recurrent  . Neuropathy   . Renal insufficiency   . Type 2 diabetes mellitus with complications (HCC)    diagnosed around age 25    Past Surgical History:  Procedure Laterality Date  . AMPUTATION TOE Right    great toe  . AV FISTULA INSERTION W/ RF MAGNETIC GUIDANCE Left 04/06/2019   Procedure: AV FISTULA INSERTION W/RF MAGNETIC GUIDANCE;  Surgeon: Katha Cabal, MD;  Location: [facility];  Service: Cardiovascular;  Laterality: Left;  . AV FISTULA PLACEMENT Left 05/06/2019   Procedure: INSERTION OF ARTERIOVENOUS (AV) GORE-TEX GRAFT ARM ( FOREARM LOOP );  Surgeon: Katha Cabal, MD;  Location: [facility];  Service: Vascular;  Laterality: Left;  . CATARACT EXTRACTION W/PHACO Left 05/19/2016   Procedure: CATARACT EXTRACTION PHACO AND INTRAOCULAR LENS PLACEMENT LEFT EYE;  Surgeon: Tonny Branch, MD;  [facility]: [facility];  Service: Ophthalmology;  Laterality: Left;  CDE: 7.30  . CATARACT EXTRACTION W/PHACO Right 06/23/2016   Procedure: CATARACT EXTRACTION  PHACO AND INTRAOCULAR LENS PLACEMENT RIGHT EYE CDE=9.87;  Surgeon: Tonny Branch, MD;  [facility];  Service: Ophthalmology;  Laterality: Right;  right  . ESOPHAGOGASTRODUODENOSCOPY  2015   Dr. Britta Mccreedy  . ESOPHAGOGASTRODUODENOSCOPY N/A 10/26/2014   RMR: Distal esophagititis-likely reflux related although an element of pill induced injuruy no exclueded  status post biopsy. Diffusely abnormal gastric mucosa of uncertain signigicane -status post gastric biopsy. Focal area of excoriation in the cardia most consistant with a trauma of heaving.   . ESOPHAGOGASTRODUODENOSCOPY  08/2015   [facility]: mild esophagitis and old gastric contents  . EYE SURGERY  2015   stent placed to left eye  . EYE SURGERY    . INCISION AND DRAINAGE OF WOUND Right 07/16/2017   Procedure: IRRIGATION AND DEBRIDEMENT OF SOFT TISSUE OF ULCERATION RIGHT FOOT;  Surgeon: Caprice Beaver, DPM;  Location: [facility];  Service: Podiatry;  Laterality: Right;      Allergies  Allergen Reactions  . Donnatal [Phenobarbital-Belladonna Alk] Anaphylaxis and Other (See Comments)    Reaction to GI cocktail  . Fish Allergy Anaphylaxis, Hives and Rash  . Haldol [Haloperidol Lactate] Other (See Comments)    Chest Pain  . Maalox [Calcium Carbonate Antacid] Anaphylaxis and Other (See Comments)    Reaction to GI cocktail  . Marinol [Dronabinol] Nausea And Vomiting and Other (See Comments)    Caused worsening vomiting.   [name]  Aspirin Hives and Itching  . Bactrim [Sulfamethoxazole-Trimethoprim] Itching  . Keflex [Cephalexin] Hives, Itching and Nausea And Vomiting  . Penicillins Hives, Itching and Other (See Comments)    Has patient had a PCN reaction causing immediate rash, facial/tongue/throat swelling, SOB or lightheadedness with hypotension: yes Has patient had a PCN reaction causing severe rash involving mucus membranes or skin necrosis: yes Has patient had a PCN reaction that required hospitalization no Has patient had a PCN reaction  occurring within the last 10 years: yes If all of the above answers are "NO", then may proceed with Cephalospor    Current Outpatient Medications  Medication Sig Dispense Refill  . amLODipine (NORVASC) 5 MG tablet Take 5 mg by mouth daily.    [name] atorvastatin (LIPITOR) 10 MG tablet Take 10 mg by mouth at bedtime.     . brimonidine (ALPHAGAN) 0.15 % ophthalmic solution Place 1 drop into both eyes 3 (three) times daily.     . carvedilol (COREG) 25 MG tablet Take 25 mg by mouth 2 (two) times daily with a meal.    . cloNIDine (CATAPRES) 0.1 MG tablet Take 0.1 mg by mouth 3 (three) times daily as needed. BP <140/90    . cloNIDine (CATAPRES) 0.2 MG tablet Take 0.2 mg by mouth 3 (three) times daily.     . cyanocobalamin (,VITAMIN B-12,) 1000 MCG/ML injection Inject 1,000 mcg into the muscle every 30 (thirty) days.   1  . dorzolamide (TRUSOPT) 2 % ophthalmic solution Place 1 drop into both eyes 2 (two) times daily.   11  . folic acid (FOLVITE) 1 MG tablet Take 1 mg by mouth daily.    . hydrALAZINE (APRESOLINE) 100 MG tablet Take 100 mg by mouth 3 (three) times daily.    . insulin aspart protamine- aspart (NOVOLOG MIX 70/30) (70-30) 100 UNIT/ML injection Inject 0.25 mLs (25 Units total) into the skin 2 (two) times daily with a meal. (Patient taking differently: Inject 25-28 Units into the skin See admin instructions. 28 units in morning and 25 units at night) 10 mL 11  . LORazepam (ATIVAN) 1 MG tablet Take 1 mg by mouth 2 (two) times daily as needed for anxiety.   3  . metoCLOPramide (REGLAN) 5 MG tablet Take 1 tablet (5 mg total) by mouth 2 (two) times daily at 8 am and 10 pm. 60 tablet 3  . oxyCODONE-acetaminophen (PERCOCET) 5-325 MG tablet Take 1 tablet by mouth every 6 (six) hours as needed for moderate pain or severe pain. 40 tablet 0  . pantoprazole (PROTONIX) 40 MG tablet Take 1 tablet (40 mg total) by mouth 2 (two) times daily before a meal. 180 tablet 3  . potassium chloride SA (K-DUR) 20 MEQ  tablet Take 20 mEq by mouth daily.    . prednisoLONE acetate (PRED FORTE) 1 % ophthalmic suspension Place 1 drop into the right eye daily.     . promethazine (PHENERGAN) 25 MG tablet Take 12.5 mg by mouth every 6 (six) hours as needed for nausea or vomiting.     . sitaGLIPtin (JANUVIA) 25 MG tablet Take 25 mg by mouth daily.     . sucralfate (CARAFATE) 1 g tablet Take 1 g by mouth 4 (four) times daily -  with meals and at bedtime.    . timolol (TIMOPTIC) 0.5 % ophthalmic solution Place 1 drop into both eyes 2 (two) times daily.    [name] torsemide (DEMADEX) 20 MG tablet Take 40 mg by mouth daily.  No current facility-administered medications for this visit.         Physical Exam There were no vitals taken for this visit. Gen:  WD/WN, NAD Skin: incision C/D/I; 2-3+ edema around the graft     Assessment/Plan: 1. Chronic renal insufficiency, stage V (HCC) The patient's graft is patent but he is still very swollen and would be hard to access.  I will have him come back in 2 weeks to see if he is ready for accessing.      [name] 05/23/2019, 10:16 AM   This note was created with Dragon medical transcription system.  Any errors from dictation are unintentional.

## 2019-05-24 DIAGNOSIS — N186 End stage renal disease: Secondary | ICD-10-CM | POA: Diagnosis not present

## 2019-05-24 DIAGNOSIS — Z992 Dependence on renal dialysis: Secondary | ICD-10-CM | POA: Diagnosis not present

## 2019-05-24 DIAGNOSIS — Z23 Encounter for immunization: Secondary | ICD-10-CM | POA: Diagnosis not present

## 2019-05-25 DIAGNOSIS — Z23 Encounter for immunization: Secondary | ICD-10-CM | POA: Diagnosis not present

## 2019-05-25 DIAGNOSIS — N186 End stage renal disease: Secondary | ICD-10-CM | POA: Diagnosis not present

## 2019-05-25 DIAGNOSIS — Z992 Dependence on renal dialysis: Secondary | ICD-10-CM | POA: Diagnosis not present

## 2019-05-27 DIAGNOSIS — Z992 Dependence on renal dialysis: Secondary | ICD-10-CM | POA: Diagnosis not present

## 2019-05-27 DIAGNOSIS — N186 End stage renal disease: Secondary | ICD-10-CM | POA: Diagnosis not present

## 2019-05-27 DIAGNOSIS — Z23 Encounter for immunization: Secondary | ICD-10-CM | POA: Diagnosis not present

## 2019-05-30 DIAGNOSIS — N186 End stage renal disease: Secondary | ICD-10-CM | POA: Diagnosis not present

## 2019-05-30 DIAGNOSIS — Z23 Encounter for immunization: Secondary | ICD-10-CM | POA: Diagnosis not present

## 2019-05-30 DIAGNOSIS — Z992 Dependence on renal dialysis: Secondary | ICD-10-CM | POA: Diagnosis not present

## 2019-06-01 DIAGNOSIS — Z992 Dependence on renal dialysis: Secondary | ICD-10-CM | POA: Diagnosis not present

## 2019-06-01 DIAGNOSIS — Z23 Encounter for immunization: Secondary | ICD-10-CM | POA: Diagnosis not present

## 2019-06-01 DIAGNOSIS — N186 End stage renal disease: Secondary | ICD-10-CM | POA: Diagnosis not present

## 2019-06-03 DIAGNOSIS — Z23 Encounter for immunization: Secondary | ICD-10-CM | POA: Diagnosis not present

## 2019-06-03 DIAGNOSIS — N186 End stage renal disease: Secondary | ICD-10-CM | POA: Diagnosis not present

## 2019-06-03 DIAGNOSIS — Z992 Dependence on renal dialysis: Secondary | ICD-10-CM | POA: Diagnosis not present

## 2019-06-06 DIAGNOSIS — Z992 Dependence on renal dialysis: Secondary | ICD-10-CM | POA: Diagnosis not present

## 2019-06-06 DIAGNOSIS — N186 End stage renal disease: Secondary | ICD-10-CM | POA: Diagnosis not present

## 2019-06-06 DIAGNOSIS — Z23 Encounter for immunization: Secondary | ICD-10-CM | POA: Diagnosis not present

## 2019-06-07 ENCOUNTER — Other Ambulatory Visit: Payer: Self-pay

## 2019-06-07 ENCOUNTER — Encounter (INDEPENDENT_AMBULATORY_CARE_PROVIDER_SITE_OTHER): Payer: Self-pay | Admitting: Nurse Practitioner

## 2019-06-07 ENCOUNTER — Ambulatory Visit (INDEPENDENT_AMBULATORY_CARE_PROVIDER_SITE_OTHER): Payer: Medicare HMO | Admitting: Nurse Practitioner

## 2019-06-07 VITALS — BP 163/76 | HR 76 | Resp 16 | Wt 304.2 lb

## 2019-06-07 DIAGNOSIS — E785 Hyperlipidemia, unspecified: Secondary | ICD-10-CM

## 2019-06-07 DIAGNOSIS — K219 Gastro-esophageal reflux disease without esophagitis: Secondary | ICD-10-CM

## 2019-06-07 DIAGNOSIS — N185 Chronic kidney disease, stage 5: Secondary | ICD-10-CM

## 2019-06-07 DIAGNOSIS — I10 Essential (primary) hypertension: Secondary | ICD-10-CM

## 2019-06-08 DIAGNOSIS — Z23 Encounter for immunization: Secondary | ICD-10-CM | POA: Diagnosis not present

## 2019-06-08 DIAGNOSIS — Z992 Dependence on renal dialysis: Secondary | ICD-10-CM | POA: Diagnosis not present

## 2019-06-08 DIAGNOSIS — N186 End stage renal disease: Secondary | ICD-10-CM | POA: Diagnosis not present

## 2019-06-10 DIAGNOSIS — Z23 Encounter for immunization: Secondary | ICD-10-CM | POA: Diagnosis not present

## 2019-06-10 DIAGNOSIS — N186 End stage renal disease: Secondary | ICD-10-CM | POA: Diagnosis not present

## 2019-06-10 DIAGNOSIS — Z992 Dependence on renal dialysis: Secondary | ICD-10-CM | POA: Diagnosis not present

## 2019-06-12 HISTORY — PX: EYE SURGERY: SHX253

## 2019-06-13 ENCOUNTER — Encounter (INDEPENDENT_AMBULATORY_CARE_PROVIDER_SITE_OTHER): Payer: Self-pay | Admitting: Nurse Practitioner

## 2019-06-13 DIAGNOSIS — N186 End stage renal disease: Secondary | ICD-10-CM | POA: Diagnosis not present

## 2019-06-13 DIAGNOSIS — Z23 Encounter for immunization: Secondary | ICD-10-CM | POA: Diagnosis not present

## 2019-06-13 DIAGNOSIS — Z992 Dependence on renal dialysis: Secondary | ICD-10-CM | POA: Diagnosis not present

## 2019-06-13 NOTE — Progress Notes (Signed)
SUBJECTIVE:  Patient ID: Timothy Clark, male    DOB: 1970/12/27, 48 y.o.   MRN: [id number] Chief Complaint  Patient presents with   Follow-up    HPI  Timothy Clark is a 48 y.o. male presents today for follow-up of insertion of a left upper extremity AV forearm loop graft.  The graft was placed on 05/06/2019.  The patient previously followed up 2 weeks ago and the swelling in the left upper extremity was extensive and it was felt that the swelling would inhibit successful cannulation.  However, the flow volume was adequate for dialysis and there is no evidence of stenosis.  Today, the patient continues to have a swollen left upper extremity and edematous area over the loop graft.  The lateral portion of the loop graft is felt fairly easily however the medial portion is difficult to palpate.  The patient denies ever having a PermCath in his left chest however he does note that he had a central line there placed at 1 point time for IV antibiotics.  He denies any fever, chills, nausea, vomiting or diarrhea.  He denies any chest pain or shortness of breath.  Past Medical History:  Diagnosis Date   Anxiety    CHF (congestive heart failure) (HCC)    Chronic abdominal pain    Depression    Esophagitis    Eye hemorrhage    Gastritis    Gastroparesis    GERD (gastroesophageal reflux disease)    Glaucoma    Helicobacter pylori (H. pylori) infection 2016   ERADICATION DOCUMENTED VIA STOOL TEST in AUG 2016 at [facility]   Hiccups    Hypertension    Nausea and vomiting    chronic, recurrent   Neuropathy    Renal insufficiency    Type 2 diabetes mellitus with complications ([place])    diagnosed around age 51    Past Surgical History:  Procedure Laterality Date   AMPUTATION TOE Right    great toe   AV FISTULA INSERTION W/ RF MAGNETIC GUIDANCE Left 04/06/2019   Procedure: AV FISTULA INSERTION W/RF MAGNETIC GUIDANCE;  Surgeon: Katha Cabal, MD;  Location: [facility];  Service: Cardiovascular;  Laterality: Left;   AV FISTULA PLACEMENT Left 05/06/2019   Procedure: INSERTION OF ARTERIOVENOUS (AV) GORE-TEX GRAFT ARM ( FOREARM LOOP );  Surgeon: Katha Cabal, MD;  Location: [facility];  Service: Vascular;  Laterality: Left;   CATARACT EXTRACTION W/PHACO Left 05/19/2016   Procedure: CATARACT EXTRACTION PHACO AND INTRAOCULAR LENS PLACEMENT LEFT EYE;  Surgeon: Tonny Branch, MD;  Location: [facility];  Service: Ophthalmology;  Laterality: Left;  CDE: 7.30   CATARACT EXTRACTION W/PHACO Right 06/23/2016   Procedure: CATARACT EXTRACTION PHACO AND INTRAOCULAR LENS PLACEMENT RIGHT EYE CDE=9.87;  Surgeon: Tonny Branch, MD;  Location: [facility];  Service: Ophthalmology;  Laterality: Right;  right   ESOPHAGOGASTRODUODENOSCOPY  2015   Dr. Britta Mccreedy   ESOPHAGOGASTRODUODENOSCOPY N/A 10/26/2014   RMR: Distal esophagititis-likely reflux related although an element of pill induced injuruy no exclueded  status post biopsy. Diffusely abnormal gastric mucosa of uncertain signigicane -status post gastric biopsy. Focal area of excoriation in the cardia most consistant with a trauma of heaving.    ESOPHAGOGASTRODUODENOSCOPY  08/2015   [facility]: mild esophagitis and old gastric contents   EYE SURGERY  2015   stent placed to left eye   EYE SURGERY     INCISION AND DRAINAGE OF WOUND Right 07/16/2017   Procedure: IRRIGATION AND DEBRIDEMENT OF SOFT  TISSUE OF ULCERATION RIGHT FOOT;  Surgeon: Caprice Beaver, DPM;  Location: [facility];  Service: Podiatry;  Laterality: Right;    Social History   Socioeconomic History   Marital status: Single    Spouse name: Not on file   Number of children: Not on file   Years of education: Not on file   Highest education level: Not on file  Occupational History   Not on file  Social Needs   Financial resource strain: Not on file   Food insecurity    Worry: Not on file    Inability: Not on file   Transportation needs    Medical:  Not on file    Non-medical: Not on file  Tobacco Use   Smoking status: Never Smoker   Smokeless tobacco: Never Used  Substance and Sexual Activity   Alcohol use: No    Alcohol/week: 0.0 standard drinks   Drug use: No   Sexual activity: Yes  Lifestyle   Physical activity    Days per week: Not on file    Minutes per session: Not on file   Stress: Not on file  Relationships   Social connections    Talks on phone: Not on file    Gets together: Not on file    Attends religious service: Not on file    Active member of club or organization: Not on file    Attends meetings of clubs or organizations: Not on file    Relationship status: Not on file   Intimate partner violence    Fear of current or ex partner: Not on file    Emotionally abused: Not on file    Physically abused: Not on file    Forced sexual activity: Not on file  Other Topics Concern   Not on file  Social History Narrative   Lives with dad and sister    Family History  Problem Relation Age of Onset   Ovarian cancer Mother    Heart attack Father    Colon polyps Father    Cervical cancer Sister    Diabetes Sister    Colon cancer Neg Hx     Allergies  Allergen Reactions   Donnatal [Phenobarbital-Belladonna Alk] Anaphylaxis and Other (See Comments)    Reaction to GI cocktail   Fish Allergy Anaphylaxis, Hives and Rash   Haldol [Haloperidol Lactate] Other (See Comments)    Chest Pain   Maalox [Calcium Carbonate Antacid] Anaphylaxis and Other (See Comments)    Reaction to GI cocktail   Marinol [Dronabinol] Nausea And Vomiting and Other (See Comments)    Caused worsening vomiting.    Aspirin Hives and Itching   Bactrim [Sulfamethoxazole-Trimethoprim] Itching   Keflex [Cephalexin] Hives, Itching and Nausea And Vomiting   Penicillins Hives, Itching and Other (See Comments)    Has patient had a PCN reaction causing immediate rash, facial/tongue/throat swelling, SOB or lightheadedness with  hypotension: yes Has patient had a PCN reaction causing severe rash involving mucus membranes or skin necrosis: yes Has patient had a PCN reaction that required hospitalization no Has patient had a PCN reaction occurring within the last 10 years: yes If all of the above answers are "NO", then may proceed with Cephalospor     Review of Systems   Review of Systems: Negative Unless Checked Constitutional: '[]' Weight loss  '[]' Fever  '[]' Chills Cardiac: '[]' Chest pain   '[]'  Atrial Fibrillation  '[]' Palpitations   '[]' Shortness of breath when laying flat   '[]' Shortness of breath with exertion. '[]' Shortness of  breath at rest Vascular:  '[]' Pain in legs with walking   '[]' Pain in legs with standing '[]' Pain in legs when laying flat   '[]' Claudication    '[]' Pain in feet when laying flat    '[]' History of DVT   '[]' Phlebitis   '[]' Swelling in legs   '[]' Varicose veins   '[]' Non-healing ulcers Pulmonary:   '[]' Uses home oxygen   '[]' Productive cough   '[]' Hemoptysis   '[]' Wheeze  '[]' COPD   '[]' Asthma Neurologic:  '[]' Dizziness   '[]' Seizures  '[]' Blackouts '[]' History of stroke   '[]' History of TIA  '[]' Aphasia   '[x]'  Blindness   '[x]' Weakness or numbness in arm   '[]' Weakness or numbness in leg Musculoskeletal:   '[]' Joint swelling   '[]' Joint pain   '[]' Low back pain  '[]'  History of Knee Replacement '[]' Arthritis '[]' back Surgeries  '[]'  Spinal Stenosis    Hematologic:  '[]' Easy bruising  '[]' Easy bleeding   '[]' Hypercoagulable state   '[x]' Anemic Gastrointestinal:  '[]' Diarrhea   '[]' Vomiting  '[x]' Gastroesophageal reflux/heartburn   '[]' Difficulty swallowing. '[]' Abdominal pain Genitourinary:  '[x]' Chronic kidney disease   '[]' Difficult urination  '[]' Anuric   '[]' Blood in urine '[]' Frequent urination  '[]' Burning with urination   '[]' Hematuria Skin:  '[]' Rashes   '[]' Ulcers '[]' Wounds Psychological:  '[]' History of anxiety   '[x]'  History of major depression  '[]'  Memory Difficulties      OBJECTIVE:   Physical Exam  BP (!) 163/76 (BP Location: Right Arm)    Pulse 76    Resp 16    Wt (!) 304 lb 3.8 oz (138 kg)     BMI 41.26 kg/m   Gen: WD/WN, NAD Head: [place]/AT, No temporalis wasting.  Ear/Nose/Throat: Hearing grossly intact, nares w/o erythema or drainage Eyes: PER, EOMI, sclera nonicteric.  Neck: Supple, no masses.  No JVD.  Pulmonary:  Good air movement, no use of accessory muscles.  Cardiac: RRR Vascular: Loop graft with good thrill and bruit however forearm is very swollen hard to palpate medial portion of loop graft Vessel Right Left  Radial Palpable Palpable   Gastrointestinal: soft, non-distended. No guarding/no peritoneal signs.  Musculoskeletal: M/S 5/5 throughout.  No deformity or atrophy.  Neurologic: Pain and light touch intact in extremities.  Symmetrical.  Speech is fluent. Motor exam as listed above. Psychiatric: Judgment intact, Mood & affect appropriate for pt's clinical situation. Dermatologic: No Venous rashes. No Ulcers Noted.  No changes consistent with cellulitis. Lymph : No Cervical lymphadenopathy, no lichenification or skin changes of chronic lymphedema.       ASSESSMENT AND PLAN:  1. Chronic renal insufficiency, stage V (HCC) Currently the patient still has an excessive amount of edema around his left upper extremity loop graft.  The actual function of the graft is doing well at this time he could probably utilize it for dialysis however the concern is that he is so swollen they may not be able to adequately access his fistula without some difficulty.  We will allow the patient 2 more weeks to see if the swelling subsides.  I also discussed with the patient due to the fact that his arm seems to be more swollen than just the site, that we may need to undergo a fistulogram to determine if the patient has any possible central venous stenosis that needs to be treated.  At this time the patient does prefer with the less invasive approach of waiting to see if the swelling subsides.  We will have the patient return in 2 to 3 weeks to determine if the swelling is improved.  2.  Hyperlipidemia,  unspecified hyperlipidemia type Continue statin as ordered and reviewed, no changes at this time   3. Essential hypertension Continue antihypertensive medications as already ordered, these medications have been reviewed and there are no changes at this time.   4. Gastroesophageal reflux disease, unspecified whether esophagitis present Continue PPI as already ordered, this medication has been reviewed and there are no changes at this time.  Avoidence of caffeine and alcohol  Moderate elevation of the head of the bed   Current Outpatient Medications on File Prior to Visit  Medication Sig Dispense Refill   amLODipine (NORVASC) 5 MG tablet Take 5 mg by mouth daily.     atorvastatin (LIPITOR) 10 MG tablet Take 10 mg by mouth at bedtime.      brimonidine (ALPHAGAN) 0.15 % ophthalmic solution Place 1 drop into both eyes 3 (three) times daily.      carvedilol (COREG) 25 MG tablet Take 25 mg by mouth 2 (two) times daily with a meal.     cloNIDine (CATAPRES) 0.1 MG tablet Take 0.1 mg by mouth 3 (three) times daily as needed. BP <140/90     cloNIDine (CATAPRES) 0.2 MG tablet Take 0.2 mg by mouth 3 (three) times daily.      cyanocobalamin (,VITAMIN B-12,) 1000 MCG/ML injection Inject 1,000 mcg into the muscle every 30 (thirty) days.   1   dorzolamide (TRUSOPT) 2 % ophthalmic solution Place 1 drop into both eyes 2 (two) times daily.   11   folic acid (FOLVITE) 1 MG tablet Take 1 mg by mouth daily.     hydrALAZINE (APRESOLINE) 100 MG tablet Take 100 mg by mouth 3 (three) times daily.     insulin aspart protamine- aspart (NOVOLOG MIX 70/30) (70-30) 100 UNIT/ML injection Inject 0.25 mLs (25 Units total) into the skin 2 (two) times daily with a meal. (Patient taking differently: Inject 25-28 Units into the skin See admin instructions. 28 units in morning and 25 units at night) 10 mL 11   LORazepam (ATIVAN) 1 MG tablet Take 1 mg by mouth 2 (two) times daily as needed for  anxiety.   3   metoCLOPramide (REGLAN) 5 MG tablet Take 1 tablet (5 mg total) by mouth 2 (two) times daily at 8 am and 10 pm. 60 tablet 3   oxyCODONE-acetaminophen (PERCOCET) 5-325 MG tablet Take 1 tablet by mouth every 6 (six) hours as needed for moderate pain or severe pain. 40 tablet 0   pantoprazole (PROTONIX) 40 MG tablet Take 1 tablet (40 mg total) by mouth 2 (two) times daily before a meal. 180 tablet 3   potassium chloride SA (K-DUR) 20 MEQ tablet Take 20 mEq by mouth daily.     prednisoLONE acetate (PRED FORTE) 1 % ophthalmic suspension Place 1 drop into the right eye daily.      promethazine (PHENERGAN) 25 MG tablet Take 12.5 mg by mouth every 6 (six) hours as needed for nausea or vomiting.      sitaGLIPtin (JANUVIA) 25 MG tablet Take 25 mg by mouth daily.      sucralfate (CARAFATE) 1 g tablet Take 1 g by mouth 4 (four) times daily -  with meals and at bedtime.     timolol (TIMOPTIC) 0.5 % ophthalmic solution Place 1 drop into both eyes 2 (two) times daily.     torsemide (DEMADEX) 20 MG tablet Take 40 mg by mouth daily.     No current facility-administered medications on file prior to visit.     There are  no Patient Instructions on file for this visit. No follow-ups on file.   Kris Hartmann, NP  This note was completed with Sales executive.  Any errors are purely unintentional.

## 2019-06-15 DIAGNOSIS — Z23 Encounter for immunization: Secondary | ICD-10-CM | POA: Diagnosis not present

## 2019-06-15 DIAGNOSIS — N186 End stage renal disease: Secondary | ICD-10-CM | POA: Diagnosis not present

## 2019-06-15 DIAGNOSIS — Z992 Dependence on renal dialysis: Secondary | ICD-10-CM | POA: Diagnosis not present

## 2019-06-17 DIAGNOSIS — Z23 Encounter for immunization: Secondary | ICD-10-CM | POA: Diagnosis not present

## 2019-06-17 DIAGNOSIS — N186 End stage renal disease: Secondary | ICD-10-CM | POA: Diagnosis not present

## 2019-06-17 DIAGNOSIS — Z992 Dependence on renal dialysis: Secondary | ICD-10-CM | POA: Diagnosis not present

## 2019-06-20 DIAGNOSIS — N186 End stage renal disease: Secondary | ICD-10-CM | POA: Diagnosis not present

## 2019-06-20 DIAGNOSIS — Z23 Encounter for immunization: Secondary | ICD-10-CM | POA: Diagnosis not present

## 2019-06-20 DIAGNOSIS — Z992 Dependence on renal dialysis: Secondary | ICD-10-CM | POA: Diagnosis not present

## 2019-06-21 DIAGNOSIS — H4313 Vitreous hemorrhage, bilateral: Secondary | ICD-10-CM | POA: Diagnosis not present

## 2019-06-21 DIAGNOSIS — E1122 Type 2 diabetes mellitus with diabetic chronic kidney disease: Secondary | ICD-10-CM | POA: Diagnosis not present

## 2019-06-21 DIAGNOSIS — H4312 Vitreous hemorrhage, left eye: Secondary | ICD-10-CM | POA: Diagnosis not present

## 2019-06-21 DIAGNOSIS — H4051X3 Glaucoma secondary to other eye disorders, right eye, severe stage: Secondary | ICD-10-CM | POA: Diagnosis not present

## 2019-06-21 DIAGNOSIS — E782 Mixed hyperlipidemia: Secondary | ICD-10-CM | POA: Diagnosis not present

## 2019-06-21 DIAGNOSIS — E113512 Type 2 diabetes mellitus with proliferative diabetic retinopathy with macular edema, left eye: Secondary | ICD-10-CM | POA: Diagnosis not present

## 2019-06-21 DIAGNOSIS — I10 Essential (primary) hypertension: Secondary | ICD-10-CM | POA: Diagnosis not present

## 2019-06-22 DIAGNOSIS — Z992 Dependence on renal dialysis: Secondary | ICD-10-CM | POA: Diagnosis not present

## 2019-06-22 DIAGNOSIS — N186 End stage renal disease: Secondary | ICD-10-CM | POA: Diagnosis not present

## 2019-06-22 DIAGNOSIS — N2581 Secondary hyperparathyroidism of renal origin: Secondary | ICD-10-CM | POA: Diagnosis not present

## 2019-06-22 DIAGNOSIS — Z23 Encounter for immunization: Secondary | ICD-10-CM | POA: Diagnosis not present

## 2019-06-23 ENCOUNTER — Other Ambulatory Visit: Payer: Self-pay

## 2019-06-23 ENCOUNTER — Encounter (INDEPENDENT_AMBULATORY_CARE_PROVIDER_SITE_OTHER): Payer: Self-pay | Admitting: Nurse Practitioner

## 2019-06-23 ENCOUNTER — Telehealth: Payer: Self-pay | Admitting: Gastroenterology

## 2019-06-23 ENCOUNTER — Ambulatory Visit (INDEPENDENT_AMBULATORY_CARE_PROVIDER_SITE_OTHER): Payer: Medicare HMO | Admitting: Nurse Practitioner

## 2019-06-23 VITALS — BP 148/68 | HR 80 | Resp 16 | Wt 296.7 lb

## 2019-06-23 DIAGNOSIS — N186 End stage renal disease: Secondary | ICD-10-CM

## 2019-06-23 DIAGNOSIS — Z992 Dependence on renal dialysis: Secondary | ICD-10-CM

## 2019-06-23 DIAGNOSIS — I10 Essential (primary) hypertension: Secondary | ICD-10-CM

## 2019-06-23 DIAGNOSIS — K219 Gastro-esophageal reflux disease without esophagitis: Secondary | ICD-10-CM

## 2019-06-23 NOTE — Telephone Encounter (Signed)
EGD report from [facility] dated 03/09/2014 with mild gastritis in the antrum.  No additional recommendations.  This was for our records.  Will have report scanned in.

## 2019-06-24 DIAGNOSIS — Z992 Dependence on renal dialysis: Secondary | ICD-10-CM | POA: Diagnosis not present

## 2019-06-24 DIAGNOSIS — Z23 Encounter for immunization: Secondary | ICD-10-CM | POA: Diagnosis not present

## 2019-06-24 DIAGNOSIS — N186 End stage renal disease: Secondary | ICD-10-CM | POA: Diagnosis not present

## 2019-06-26 ENCOUNTER — Encounter (INDEPENDENT_AMBULATORY_CARE_PROVIDER_SITE_OTHER): Payer: Self-pay | Admitting: Nurse Practitioner

## 2019-06-26 DIAGNOSIS — Z992 Dependence on renal dialysis: Secondary | ICD-10-CM | POA: Insufficient documentation

## 2019-06-26 DIAGNOSIS — N186 End stage renal disease: Secondary | ICD-10-CM | POA: Insufficient documentation

## 2019-06-26 NOTE — Progress Notes (Signed)
SUBJECTIVE:  Patient ID: Timothy Clark, male    DOB: April 12, 1971, 48 y.o.   MRN: [id number] Chief Complaint  Patient presents with  . Follow-up    HPI  Timothy Clark is a 48 y.o. male that presents today for evaluation of his left lower extremity loop graft.  Previously the patient had significant swelling around his loop graft which would have made it difficult to cannulate.  Today the swelling has subsided enough to where the loop graft is palpable.  Previous noninvasive study showed adequate flow volume.  He denies any fever, chills, nausea, vomiting or diarrhea.  He denies any chest pain or shortness of breath.  Past Medical History:  Diagnosis Date  . Anxiety   . CHF (congestive heart failure) ([place])   . Chronic abdominal pain   . Depression   . Esophagitis   . Eye hemorrhage   . Gastritis   . Gastroparesis   . GERD (gastroesophageal reflux disease)   . Glaucoma   . Helicobacter pylori (H. pylori) infection 2016   ERADICATION DOCUMENTED VIA STOOL TEST in AUG 2016 at [place]  . Hiccups   . Hypertension   . Nausea and vomiting    chronic, recurrent  . Neuropathy   . Renal insufficiency   . Type 2 diabetes mellitus with complications (HCC)    diagnosed around age 71    Past Surgical History:  Procedure Laterality Date  . AMPUTATION TOE Right    great toe  . AV FISTULA INSERTION W/ RF MAGNETIC GUIDANCE Left 04/06/2019   Procedure: AV FISTULA INSERTION W/RF MAGNETIC GUIDANCE;  Surgeon: Katha Cabal, MD;  Location: [facility];  Service: Cardiovascular;  Laterality: Left;  . AV FISTULA PLACEMENT Left 05/06/2019   Procedure: INSERTION OF ARTERIOVENOUS (AV) GORE-TEX GRAFT ARM ( FOREARM LOOP );  Surgeon: Katha Cabal, MD;  Location: [facility];  Service: Vascular;  Laterality: Left;  . CATARACT EXTRACTION W/PHACO Left 05/19/2016   Procedure: CATARACT EXTRACTION PHACO AND INTRAOCULAR LENS PLACEMENT LEFT EYE;  Surgeon: Tonny Branch, MD;  Location: [facility];   Service: Ophthalmology;  Laterality: Left;  CDE: 7.30  . CATARACT EXTRACTION W/PHACO Right 06/23/2016   Procedure: CATARACT EXTRACTION PHACO AND INTRAOCULAR LENS PLACEMENT RIGHT EYE CDE=9.87;  Surgeon: Tonny Branch, MD;  Location: [facility];  Service: Ophthalmology;  Laterality: Right;  right  . ESOPHAGOGASTRODUODENOSCOPY  2015   Dr. Britta Mccreedy  . ESOPHAGOGASTRODUODENOSCOPY N/A 10/26/2014   RMR: Distal esophagititis-likely reflux related although an element of pill induced injuruy no exclueded  status post biopsy. Diffusely abnormal gastric mucosa of uncertain signigicane -status post gastric biopsy. Focal area of excoriation in the cardia most consistant with a trauma of heaving.   . ESOPHAGOGASTRODUODENOSCOPY  08/2015   [facility]: mild esophagitis and old gastric contents  . EYE SURGERY  2015   stent placed to left eye  . EYE SURGERY    . INCISION AND DRAINAGE OF WOUND Right 07/16/2017   Procedure: IRRIGATION AND DEBRIDEMENT OF SOFT TISSUE OF ULCERATION RIGHT FOOT;  Surgeon: Caprice Beaver, DPM;  Location: [facility];  Service: Podiatry;  Laterality: Right;    Social History   Socioeconomic History  . Marital status: Single    Spouse name: Not on file  . Number of children: Not on file  . Years of education: Not on file  . Highest education level: Not on file  Occupational History  . Not on file  Social Needs  . Financial resource strain: Not on file  .  Food insecurity    Worry: Not on file    Inability: Not on file  . Transportation needs    Medical: Not on file    Non-medical: Not on file  Tobacco Use  . Smoking status: Never Smoker  . Smokeless tobacco: Never Used  Substance and Sexual Activity  . Alcohol use: No    Alcohol/week: 0.0 standard drinks  . Drug use: No  . Sexual activity: Yes  Lifestyle  . Physical activity    Days per week: Not on file    Minutes per session: Not on file  . Stress: Not on file  Relationships  . Social Herbalist on phone: Not on file     Gets together: Not on file    Attends religious service: Not on file    Active member of club or organization: Not on file    Attends meetings of clubs or organizations: Not on file    Relationship status: Not on file  . Intimate partner violence    Fear of current or ex partner: Not on file    Emotionally abused: Not on file    Physically abused: Not on file    Forced sexual activity: Not on file  Other Topics Concern  . Not on file  Social History Narrative   Lives with dad and sister    Family History  Problem Relation Age of Onset  . Ovarian cancer Mother   . Heart attack Father   . Colon polyps Father   . Cervical cancer Sister   . Diabetes Sister   . Colon cancer Neg Hx     Allergies  Allergen Reactions  . Donnatal [Phenobarbital-Belladonna Alk] Anaphylaxis and Other (See Comments)    Reaction to GI cocktail  . Fish Allergy Anaphylaxis, Hives and Rash  . Haldol [Haloperidol Lactate] Other (See Comments)    Chest Pain  . Maalox [Calcium Carbonate Antacid] Anaphylaxis and Other (See Comments)    Reaction to GI cocktail  . Marinol [Dronabinol] Nausea And Vomiting and Other (See Comments)    Caused worsening vomiting.   . Aspirin Hives and Itching  . Bactrim [Sulfamethoxazole-Trimethoprim] Itching  . Keflex [Cephalexin] Hives, Itching and Nausea And Vomiting  . Penicillins Hives, Itching and Other (See Comments)    Has patient had a PCN reaction causing immediate rash, facial/tongue/throat swelling, SOB or lightheadedness with hypotension: yes Has patient had a PCN reaction causing severe rash involving mucus membranes or skin necrosis: yes Has patient had a PCN reaction that required hospitalization no Has patient had a PCN reaction occurring within the last 10 years: yes If all of the above answers are "NO", then may proceed with Cephalospor     Review of Systems   Review of Systems: Negative Unless Checked Constitutional: '[]' Weight loss  '[]' Fever  '[]' Chills  Cardiac: '[]' Chest pain   '[]'  Atrial Fibrillation  '[]' Palpitations   '[]' Shortness of breath when laying flat   '[]' Shortness of breath with exertion. '[]' Shortness of breath at rest Vascular:  '[]' Pain in legs with walking   '[]' Pain in legs with standing '[]' Pain in legs when laying flat   '[]' Claudication    '[]' Pain in feet when laying flat    '[]' History of DVT   '[]' Phlebitis   '[]' Swelling in legs   '[]' Varicose veins   '[]' Non-healing ulcers Pulmonary:   '[]' Uses home oxygen   '[]' Productive cough   '[]' Hemoptysis   '[]' Wheeze  '[]' COPD   '[]' Asthma Neurologic:  '[]' Dizziness   '[]' Seizures  '[]' Blackouts '[]' History  of stroke   '[]' History of TIA  '[]' Aphasia   '[]' Temporary Blindness   '[]' Weakness or numbness in arm   '[]' Weakness or numbness in leg Musculoskeletal:   '[]' Joint swelling   '[]' Joint pain   '[]' Low back pain  '[]'  History of Knee Replacement '[]' Arthritis '[]' back Surgeries  '[]'  Spinal Stenosis    Hematologic:  '[]' Easy bruising  '[]' Easy bleeding   '[]' Hypercoagulable state   '[]' Anemic Gastrointestinal:  '[]' Diarrhea   '[]' Vomiting  '[x]' Gastroesophageal reflux/heartburn   '[]' Difficulty swallowing. '[]' Abdominal pain Genitourinary:  '[]' Chronic kidney disease   '[]' Difficult urination  '[]' Anuric   '[]' Blood in urine '[]' Frequent urination  '[]' Burning with urination   '[]' Hematuria Skin:  '[]' Rashes   '[]' Ulcers '[]' Wounds Psychological:  '[x]' History of anxiety   '[x]'  History of major depression  '[]'  Memory Difficulties      OBJECTIVE:   Physical Exam  BP (!) 148/68 (BP Location: Right Arm)   Pulse 80   Resp 16   Wt 296 lb 11.8 oz (134.6 kg)   BMI 40.25 kg/m   Gen: WD/WN, NAD Head: [place]/AT, No temporalis wasting.  Ear/Nose/Throat: Hearing grossly intact, nares w/o erythema or drainage Eyes: PER, EOMI, sclera nonicteric.  Neck: Supple, no masses.  No JVD.  Pulmonary:  Good air movement, no use of accessory muscles.  Cardiac: RRR Vascular:  Good thrill and bruit Vessel Right Left  Radial Palpable Palpable   Gastrointestinal: soft, non-distended. No guarding/no  peritoneal signs.  Musculoskeletal: M/S 5/5 throughout.  No deformity or atrophy.  Neurologic: Pain and light touch intact in extremities.  Symmetrical.  Speech is fluent. Motor exam as listed above. Psychiatric: Judgment intact, Mood & affect appropriate for pt's clinical situation. Dermatologic: No Venous rashes. No Ulcers Noted.  No changes consistent with cellulitis. Lymph : No Cervical lymphadenopathy, no lichenification or skin changes of chronic lymphedema.       ASSESSMENT AND PLAN:  1. ESRD on dialysis [facility]) Recommend:  The patient is doing well and currently has adequate dialysis access. The patient's dialysis center is not reporting any access issues. Flow pattern is stable when compared to the prior ultrasound.  The patient should have a duplex ultrasound of the dialysis access in 6 months. The patient will follow-up with me in the office after each ultrasound   The patient was given authorization to begin using his fistula as of today.  The patient was given a letter to begin using his fistula.  We have also fax a letter to his dialysis center.    2. Gastroesophageal reflux disease, unspecified whether esophagitis present Continue PPI as already ordered, this medication has been reviewed and there are no changes at this time.  Avoidence of caffeine and alcohol  Moderate elevation of the head of the bed   3. Essential hypertension Continue antihypertensive medications as already ordered, these medications have been reviewed and there are no changes at this time.    Current Outpatient Medications on File Prior to Visit  Medication Sig Dispense Refill  . amLODipine (NORVASC) 5 MG tablet Take 5 mg by mouth daily.    [name] atorvastatin (LIPITOR) 10 MG tablet Take 10 mg by mouth at bedtime.     . brimonidine (ALPHAGAN) 0.15 % ophthalmic solution Place 1 drop into both eyes 3 (three) times daily.     . carvedilol (COREG) 25 MG tablet Take 25 mg by mouth 2 (two) times daily  with a meal.    . cloNIDine (CATAPRES) 0.1 MG tablet Take 0.1 mg by mouth 3 (three) times daily as  needed. BP <140/90    . cloNIDine (CATAPRES) 0.2 MG tablet Take 0.2 mg by mouth 3 (three) times daily.     . cyanocobalamin (,VITAMIN B-12,) 1000 MCG/ML injection Inject 1,000 mcg into the muscle every 30 (thirty) days.   1  . dorzolamide (TRUSOPT) 2 % ophthalmic solution Place 1 drop into both eyes 2 (two) times daily.   11  . folic acid (FOLVITE) 1 MG tablet Take 1 mg by mouth daily.    . hydrALAZINE (APRESOLINE) 100 MG tablet Take 100 mg by mouth 3 (three) times daily.    . insulin aspart protamine- aspart (NOVOLOG MIX 70/30) (70-30) 100 UNIT/ML injection Inject 0.25 mLs (25 Units total) into the skin 2 (two) times daily with a meal. (Patient taking differently: Inject 25-28 Units into the skin See admin instructions. 28 units in morning and 25 units at night) 10 mL 11  . LORazepam (ATIVAN) 1 MG tablet Take 1 mg by mouth 2 (two) times daily as needed for anxiety.   3  . metoCLOPramide (REGLAN) 5 MG tablet Take 1 tablet (5 mg total) by mouth 2 (two) times daily at 8 am and 10 pm. 60 tablet 3  . ofloxacin (OCUFLOX) 0.3 % ophthalmic solution Place 1 drop into the left eye 4 (four) times daily.    [name] oxyCODONE-acetaminophen (PERCOCET) 5-325 MG tablet Take 1 tablet by mouth every 6 (six) hours as needed for moderate pain or severe pain. 40 tablet 0  . pantoprazole (PROTONIX) 40 MG tablet Take 1 tablet (40 mg total) by mouth 2 (two) times daily before a meal. 180 tablet 3  . potassium chloride SA (K-DUR) 20 MEQ tablet Take 20 mEq by mouth daily.    . prednisoLONE acetate (PRED FORTE) 1 % ophthalmic suspension Place 1 drop into the right eye daily.     . promethazine (PHENERGAN) 25 MG tablet Take 12.5 mg by mouth every 6 (six) hours as needed for nausea or vomiting.     . sitaGLIPtin (JANUVIA) 25 MG tablet Take 25 mg by mouth daily.     . sucralfate (CARAFATE) 1 g tablet Take 1 g by mouth 4 (four) times  daily -  with meals and at bedtime.    . timolol (TIMOPTIC) 0.5 % ophthalmic solution Place 1 drop into both eyes 2 (two) times daily.    [name] torsemide (DEMADEX) 20 MG tablet Take 40 mg by mouth daily.     No current facility-administered medications on file prior to visit.     There are no Patient Instructions on file for this visit. No follow-ups on file.   Kris Hartmann, NP  This note was completed with Sales executive.  Any errors are purely unintentional.

## 2019-06-27 ENCOUNTER — Ambulatory Visit: Payer: Medicare HMO | Admitting: Gastroenterology

## 2019-06-27 DIAGNOSIS — N186 End stage renal disease: Secondary | ICD-10-CM | POA: Diagnosis not present

## 2019-06-27 DIAGNOSIS — Z992 Dependence on renal dialysis: Secondary | ICD-10-CM | POA: Diagnosis not present

## 2019-06-27 DIAGNOSIS — Z23 Encounter for immunization: Secondary | ICD-10-CM | POA: Diagnosis not present

## 2019-06-29 DIAGNOSIS — Z992 Dependence on renal dialysis: Secondary | ICD-10-CM | POA: Diagnosis not present

## 2019-06-29 DIAGNOSIS — Z23 Encounter for immunization: Secondary | ICD-10-CM | POA: Diagnosis not present

## 2019-06-29 DIAGNOSIS — N186 End stage renal disease: Secondary | ICD-10-CM | POA: Diagnosis not present

## 2019-07-01 DIAGNOSIS — H4312 Vitreous hemorrhage, left eye: Secondary | ICD-10-CM | POA: Diagnosis not present

## 2019-07-01 DIAGNOSIS — E113532 Type 2 diabetes mellitus with proliferative diabetic retinopathy with traction retinal detachment not involving the macula, left eye: Secondary | ICD-10-CM | POA: Diagnosis not present

## 2019-07-04 DIAGNOSIS — Z992 Dependence on renal dialysis: Secondary | ICD-10-CM | POA: Diagnosis not present

## 2019-07-04 DIAGNOSIS — N186 End stage renal disease: Secondary | ICD-10-CM | POA: Diagnosis not present

## 2019-07-04 DIAGNOSIS — Z23 Encounter for immunization: Secondary | ICD-10-CM | POA: Diagnosis not present

## 2019-07-05 DIAGNOSIS — D509 Iron deficiency anemia, unspecified: Secondary | ICD-10-CM | POA: Diagnosis not present

## 2019-07-05 DIAGNOSIS — D631 Anemia in chronic kidney disease: Secondary | ICD-10-CM | POA: Diagnosis not present

## 2019-07-05 DIAGNOSIS — E785 Hyperlipidemia, unspecified: Secondary | ICD-10-CM | POA: Diagnosis not present

## 2019-07-05 DIAGNOSIS — E11319 Type 2 diabetes mellitus with unspecified diabetic retinopathy without macular edema: Secondary | ICD-10-CM | POA: Diagnosis not present

## 2019-07-05 DIAGNOSIS — E1151 Type 2 diabetes mellitus with diabetic peripheral angiopathy without gangrene: Secondary | ICD-10-CM | POA: Diagnosis not present

## 2019-07-05 DIAGNOSIS — D519 Vitamin B12 deficiency anemia, unspecified: Secondary | ICD-10-CM | POA: Diagnosis not present

## 2019-07-05 DIAGNOSIS — E1143 Type 2 diabetes mellitus with diabetic autonomic (poly)neuropathy: Secondary | ICD-10-CM | POA: Diagnosis not present

## 2019-07-05 DIAGNOSIS — I10 Essential (primary) hypertension: Secondary | ICD-10-CM | POA: Diagnosis not present

## 2019-07-05 DIAGNOSIS — E1122 Type 2 diabetes mellitus with diabetic chronic kidney disease: Secondary | ICD-10-CM | POA: Diagnosis not present

## 2019-07-06 DIAGNOSIS — N186 End stage renal disease: Secondary | ICD-10-CM | POA: Diagnosis not present

## 2019-07-06 DIAGNOSIS — Z992 Dependence on renal dialysis: Secondary | ICD-10-CM | POA: Diagnosis not present

## 2019-07-06 DIAGNOSIS — Z23 Encounter for immunization: Secondary | ICD-10-CM | POA: Diagnosis not present

## 2019-07-08 DIAGNOSIS — Z992 Dependence on renal dialysis: Secondary | ICD-10-CM | POA: Diagnosis not present

## 2019-07-08 DIAGNOSIS — Z23 Encounter for immunization: Secondary | ICD-10-CM | POA: Diagnosis not present

## 2019-07-08 DIAGNOSIS — N186 End stage renal disease: Secondary | ICD-10-CM | POA: Diagnosis not present

## 2019-07-11 ENCOUNTER — Encounter (INDEPENDENT_AMBULATORY_CARE_PROVIDER_SITE_OTHER): Payer: Self-pay

## 2019-07-11 ENCOUNTER — Telehealth (INDEPENDENT_AMBULATORY_CARE_PROVIDER_SITE_OTHER): Payer: Self-pay

## 2019-07-11 ENCOUNTER — Other Ambulatory Visit: Payer: Self-pay | Admitting: Gastroenterology

## 2019-07-11 DIAGNOSIS — K3184 Gastroparesis: Secondary | ICD-10-CM

## 2019-07-11 DIAGNOSIS — N186 End stage renal disease: Secondary | ICD-10-CM | POA: Diagnosis not present

## 2019-07-11 DIAGNOSIS — Z992 Dependence on renal dialysis: Secondary | ICD-10-CM | POA: Diagnosis not present

## 2019-07-11 DIAGNOSIS — Z23 Encounter for immunization: Secondary | ICD-10-CM | POA: Diagnosis not present

## 2019-07-11 NOTE — Telephone Encounter (Signed)
A fax was received from [facility] for a permcath removal for the patient. Patient is scheduled with Dr. Delana Meyer for 07/19/2019 with a 1:00 pm arrival time to the [facility]. Patient will do covid testing on 07/15/2019 between 12:30-2:30 pm at the [facility]. Pre-procedure instructions were faxed back to [facility].

## 2019-07-12 DIAGNOSIS — H401190 Primary open-angle glaucoma, unspecified eye, stage unspecified: Secondary | ICD-10-CM | POA: Diagnosis not present

## 2019-07-12 DIAGNOSIS — N186 End stage renal disease: Secondary | ICD-10-CM | POA: Diagnosis not present

## 2019-07-12 DIAGNOSIS — E1122 Type 2 diabetes mellitus with diabetic chronic kidney disease: Secondary | ICD-10-CM | POA: Diagnosis not present

## 2019-07-12 DIAGNOSIS — K3184 Gastroparesis: Secondary | ICD-10-CM | POA: Diagnosis not present

## 2019-07-12 DIAGNOSIS — Z992 Dependence on renal dialysis: Secondary | ICD-10-CM | POA: Diagnosis not present

## 2019-07-12 DIAGNOSIS — I129 Hypertensive chronic kidney disease with stage 1 through stage 4 chronic kidney disease, or unspecified chronic kidney disease: Secondary | ICD-10-CM | POA: Diagnosis not present

## 2019-07-12 DIAGNOSIS — D519 Vitamin B12 deficiency anemia, unspecified: Secondary | ICD-10-CM | POA: Diagnosis not present

## 2019-07-12 DIAGNOSIS — Z6841 Body Mass Index (BMI) 40.0 and over, adult: Secondary | ICD-10-CM | POA: Diagnosis not present

## 2019-07-12 DIAGNOSIS — E785 Hyperlipidemia, unspecified: Secondary | ICD-10-CM | POA: Diagnosis not present

## 2019-07-12 DIAGNOSIS — D631 Anemia in chronic kidney disease: Secondary | ICD-10-CM | POA: Diagnosis not present

## 2019-07-12 DIAGNOSIS — U071 COVID-19: Secondary | ICD-10-CM

## 2019-07-12 HISTORY — DX: COVID-19: U07.1

## 2019-07-13 DIAGNOSIS — Z992 Dependence on renal dialysis: Secondary | ICD-10-CM | POA: Diagnosis not present

## 2019-07-13 DIAGNOSIS — N186 End stage renal disease: Secondary | ICD-10-CM | POA: Diagnosis not present

## 2019-07-13 NOTE — Progress Notes (Signed)
Referring Provider: [name], MD Primary Care Physician:  [name], MD Primary GI Physician: Dr. Gala Romney  Chief Complaint  Patient presents with  . Gastroesophageal Reflux    doing ok    HPI:   Timothy Clark is a 48 y.o. male presenting today for follow-up. History of chronic abdominal pain, GERD, esophagitis, gastritis, suspected gastroparesis, recurrent nausea and vomiting, H. pylori s/p eradication in 2016, T2DM, CKD, and diastolic dysfunction.  GES on 06/15/2015 with rapid gastric transit.  [facility] question dumping syndrome, but EGD at [facility] January 2017 with mild esophagitis and old gastric contents.  Felt clinically consistent with gastroparesis.  RUQ ultrasound March 2019 normal.  CT November 2019 with fatty liver, gallbladder sludge.  HIDA scan normal in June 2020.  No prior colonoscopy.   Patient was last seen in our office on 03/15/2019.  He continued with epigastric pain 4/10 every morning that resolved around 11 AM.  Symptoms were about the same for the last 2-3 years.  Reported nausea the last 2 mornings and one episode of vomiting.  Prior to this, no nausea or vomiting for 1 month.  Thought nausea and vomiting may be secondary to elevated blood pressures recently.  Phenergan as needed helped with nausea.  Using Bentyl if epigastric pain was bad which helped some.  Decrease Reglan to once a day as Reglan in the morning and at lunch was causing diarrhea.  Also reported constipation with BM 1-2 times a week with straining and hard stools.  Advised to increase Reglan to twice daily before breakfast and before dinner, counseled on gastroparesis diet, continue Phenergan and Bentyl as needed, continue Protonix twice daily, add daily fiber supplement and MiraLAX for constipation.  Patient did not want to schedule colonoscopy.  He was planning to have fistula placed for dialysis soon.  Plans to follow-up in 3 months.  Today he states he is doing well. Stomach is not bother him at  this time. Taking Reglan in morning and at lunch. Also taking Protonix BID. No nausea or vomiting. No dysphagia. No reflux symptoms. No abdominal pain. No unintentional weight loss. Started dialysis. Monday, Wednesday, and Friday. Feels a lot of his symptoms improved after starting dialysis. BMs daily. No constipation or diarrhea. Not having to use Miralax. No brbpr or black stools. No NSAIDs. Wanting to hold off on colonoscopy at this time.   No involuntary movements, face twitching, depression, or suicidal thoughts.  Receives his medications in a pill pack.  Has not picked up his December pack yet.  Ok with trying to decrease Reglan.  Reports he is following low fiber and low fat diet.   Past Medical History:  Diagnosis Date  . Anxiety   . CHF (congestive heart failure) ([place])   . Chronic abdominal pain   . Depression   . Esophagitis   . ESRD on hemodialysis ([place])   . Eye hemorrhage   . Gastritis   . Gastroparesis   . GERD (gastroesophageal reflux disease)   . Glaucoma   . Helicobacter pylori (H. pylori) infection 2016   ERADICATION DOCUMENTED VIA STOOL TEST in AUG 2016 at [place]  . Hiccups   . Hypertension   . Nausea and vomiting    chronic, recurrent  . Neuropathy   . Renal insufficiency   . Type 2 diabetes mellitus with complications (HCC)    diagnosed around age 9    Past Surgical History:  Procedure Laterality Date  . AMPUTATION TOE Right  great toe  . AV FISTULA INSERTION W/ RF MAGNETIC GUIDANCE Left 04/06/2019   Procedure: AV FISTULA INSERTION W/RF MAGNETIC GUIDANCE;  Surgeon: Katha Cabal, MD;  Location: [place] [facility];  Service: Cardiovascular;  Laterality: Left;  . AV FISTULA PLACEMENT Left 05/06/2019   Procedure: INSERTION OF ARTERIOVENOUS (AV) GORE-TEX GRAFT ARM ( FOREARM LOOP );  Surgeon: Katha Cabal, MD;  Location: [facility];  Service: Vascular;  Laterality: Left;  . CATARACT EXTRACTION W/PHACO Left 05/19/2016   Procedure: CATARACT EXTRACTION  PHACO AND INTRAOCULAR LENS PLACEMENT LEFT EYE;  Surgeon: Tonny Branch, MD;  Location: [facility];  Service: Ophthalmology;  Laterality: Left;  CDE: 7.30  . CATARACT EXTRACTION W/PHACO Right 06/23/2016   Procedure: CATARACT EXTRACTION PHACO AND INTRAOCULAR LENS PLACEMENT RIGHT EYE CDE=9.87;  Surgeon: Tonny Branch, MD;  Location: [facility];  Service: Ophthalmology;  Laterality: Right;  right  . ESOPHAGOGASTRODUODENOSCOPY  2015   Dr. Britta Mccreedy  . ESOPHAGOGASTRODUODENOSCOPY N/A 10/26/2014   RMR: Distal esophagititis-likely reflux related although an element of pill induced injuruy no exclueded  status post biopsy. Diffusely abnormal gastric mucosa of uncertain signigicane -status post gastric biopsy. Focal area of excoriation in the cardia most consistant with a trauma of heaving.   . ESOPHAGOGASTRODUODENOSCOPY  08/2015   [facility]: mild esophagitis and old gastric contents  . EYE SURGERY  2015   stent placed to left eye  . EYE SURGERY  06/2019   per patient- to remove scar tissue  . INCISION AND DRAINAGE OF WOUND Right 07/16/2017   Procedure: IRRIGATION AND DEBRIDEMENT OF SOFT TISSUE OF ULCERATION RIGHT FOOT;  Surgeon: Caprice Beaver, DPM;  Location: [facility];  Service: Podiatry;  Laterality: Right;    Current Outpatient Medications  Medication Sig Dispense Refill  . amLODipine (NORVASC) 5 MG tablet Take 5 mg by mouth daily.    [name] atorvastatin (LIPITOR) 10 MG tablet Take 10 mg by mouth at bedtime.     . brimonidine (ALPHAGAN) 0.15 % ophthalmic solution Place 1 drop into both eyes 3 (three) times daily.     . carvedilol (COREG) 25 MG tablet Take 25 mg by mouth 2 (two) times daily with a meal.    . cloNIDine (CATAPRES) 0.1 MG tablet Take 0.1 mg by mouth 3 (three) times daily as needed. BP <140/90    . cloNIDine (CATAPRES) 0.2 MG tablet Take 0.2 mg by mouth 3 (three) times daily.     . cyanocobalamin (,VITAMIN B-12,) 1000 MCG/ML injection Inject 1,000 mcg into the muscle every 30 (thirty) days.   1  . dorzolamide  (TRUSOPT) 2 % ophthalmic solution Place 1 drop into both eyes 2 (two) times daily.   11  . folic acid (FOLVITE) 1 MG tablet Take 1 mg by mouth daily.    . hydrALAZINE (APRESOLINE) 100 MG tablet Take 100 mg by mouth 3 (three) times daily.    . insulin aspart protamine- aspart (NOVOLOG MIX 70/30) (70-30) 100 UNIT/ML injection Inject 0.25 mLs (25 Units total) into the skin 2 (two) times daily with a meal. (Patient taking differently: Inject 25-28 Units into the skin See admin instructions. 28 units in morning and 25 units at night) 10 mL 11  . LORazepam (ATIVAN) 1 MG tablet Take 1 mg by mouth 2 (two) times daily as needed for anxiety.   3  . losartan (COZAAR) 50 MG tablet Take 1 tablet by mouth at bedtime.    [name] ofloxacin (OCUFLOX) 0.3 % ophthalmic solution Place 1 drop into the left eye  4 (four) times daily.    . pantoprazole (PROTONIX) 40 MG tablet Take 1 tablet (40 mg total) by mouth 2 (two) times daily before a meal. 180 tablet 3  . potassium chloride SA (K-DUR) 20 MEQ tablet Take 20 mEq by mouth daily.    . prednisoLONE acetate (PRED FORTE) 1 % ophthalmic suspension Place 1 drop into the right eye daily.     . promethazine (PHENERGAN) 25 MG tablet Take 12.5 mg by mouth every 6 (six) hours as needed for nausea or vomiting.     . sitaGLIPtin (JANUVIA) 25 MG tablet Take 25 mg by mouth daily.     . sucralfate (CARAFATE) 1 g tablet Take 1 g by mouth 4 (four) times daily -  with meals and at bedtime.    . timolol (TIMOPTIC) 0.5 % ophthalmic solution Place 1 drop into both eyes 2 (two) times daily.    [name] torsemide (DEMADEX) 20 MG tablet Take 40 mg by mouth daily.    . metoCLOPramide (REGLAN) 5 MG tablet Take 1 tablet (5 mg total) by mouth daily before breakfast. 30 tablet 5   No current facility-administered medications for this visit.     Allergies as of 07/14/2019 - Review Complete 07/14/2019  Allergen Reaction Noted  . Donnatal [phenobarbital-belladonna alk] Anaphylaxis and Other (See Comments)  07/12/2015  . Fish allergy Anaphylaxis, Hives, and Rash 01/14/2015  . Haldol [haloperidol lactate] Other (See Comments) 08/07/2015  . Maalox [calcium carbonate antacid] Anaphylaxis and Other (See Comments) 07/12/2015  . Marinol [dronabinol] Nausea And Vomiting and Other (See Comments) 05/28/2018  . Aspirin Hives and Itching 03/22/2009  . Bactrim [sulfamethoxazole-trimethoprim] Itching 12/06/2014  . Keflex [cephalexin] Hives, Itching, and Nausea And Vomiting 06/22/2017  . Penicillins Hives, Itching, and Other (See Comments) 03/22/2009    Family History  Problem Relation Age of Onset  . Ovarian cancer Mother   . Heart attack Father   . Colon polyps Father   . Cervical cancer Sister   . Diabetes Sister   . Colon cancer Neg Hx     Social History   Socioeconomic History  . Marital status: Single    Spouse name: Not on file  . Number of children: Not on file  . Years of education: Not on file  . Highest education level: Not on file  Occupational History  . Not on file  Social Needs  . Financial resource strain: Not on file  . Food insecurity    Worry: Not on file    Inability: Not on file  . Transportation needs    Medical: Not on file    Non-medical: Not on file  Tobacco Use  . Smoking status: Never Smoker  . Smokeless tobacco: Never Used  Substance and Sexual Activity  . Alcohol use: No    Alcohol/week: 0.0 standard drinks  . Drug use: No  . Sexual activity: Yes  Lifestyle  . Physical activity    Days per week: Not on file    Minutes per session: Not on file  . Stress: Not on file  Relationships  . Social Herbalist on phone: Not on file    Gets together: Not on file    Attends religious service: Not on file    Active member of club or organization: Not on file    Attends meetings of clubs or organizations: Not on file    Relationship status: Not on file  Other Topics Concern  . Not on file  Social History  Narrative   Lives with dad and sister     Review of Systems: Gen: Denies fever, chills, lightheadedness, dizziness, presyncope, or syncope. CV: Denies chest pain, palpitations Resp: Denies dyspnea or cough GI: See HPI Derm: Denies rash Psych: Denies depression, anxiety Heme: Denies bruising, bleeding  Physical Exam: BP 114/69   Pulse 68   Temp (!) 96.6 F (35.9 C) (Temporal)   Ht 6' (1.829 m)   Wt 296 lb 9.6 oz (134.5 kg)   BMI 40.23 kg/m  General:   Alert and oriented. No distress noted. Pleasant and cooperative.  Head:  Normocephalic and atraumatic. Eyes:  Conjuctiva clear without scleral icterus. Heart:  S1, S2 present without murmurs appreciated. Lungs:  Clear to auscultation bilaterally. No wheezes, rales, or rhonchi. No distress.  Abdomen:  +BS, soft, non-tender and non-distended. No rebound or guarding. No HSM or masses noted. Msk:  Symmetrical without gross deformities. Normal posture. Extremities: 1+ bilateral lower extremity pitting edema.  Significantly dry skin of the lower legs. Neurologic:  Alert and  oriented x4 Psych:  Normal mood and affect.

## 2019-07-14 ENCOUNTER — Encounter: Payer: Self-pay | Admitting: Gastroenterology

## 2019-07-14 ENCOUNTER — Ambulatory Visit: Payer: Medicare HMO | Admitting: Gastroenterology

## 2019-07-14 ENCOUNTER — Other Ambulatory Visit: Payer: Self-pay

## 2019-07-14 VITALS — BP 114/69 | HR 68 | Temp 96.6°F | Ht 72.0 in | Wt 296.6 lb

## 2019-07-14 DIAGNOSIS — K3184 Gastroparesis: Secondary | ICD-10-CM | POA: Diagnosis not present

## 2019-07-14 DIAGNOSIS — K5900 Constipation, unspecified: Secondary | ICD-10-CM

## 2019-07-14 DIAGNOSIS — Z09 Encounter for follow-up examination after completed treatment for conditions other than malignant neoplasm: Secondary | ICD-10-CM | POA: Diagnosis not present

## 2019-07-14 DIAGNOSIS — K219 Gastro-esophageal reflux disease without esophagitis: Secondary | ICD-10-CM | POA: Diagnosis not present

## 2019-07-14 DIAGNOSIS — E113532 Type 2 diabetes mellitus with proliferative diabetic retinopathy with traction retinal detachment not involving the macula, left eye: Secondary | ICD-10-CM | POA: Diagnosis not present

## 2019-07-14 MED ORDER — METOCLOPRAMIDE HCL 5 MG PO TABS: 5.0000 mg | ORAL_TABLET | Freq: Every day | ORAL | 5 refills | Status: DC

## 2019-07-14 NOTE — Assessment & Plan Note (Addendum)
48 year old male with past medical history of gastroparesis.  GES 06/15/2015 with rapid gastric transit.  [facility] questioned dumping syndrome, but EGD at [facility] January 2017 with mild esophagitis and old gastric contents.  Felt clinically consistent with gastroparesis.  At last visit in August 2020, patient reported epigastric pain every morning.  Also with nausea for 2 days before his appointment and one episode of emesis.  Reglan was increased to twice daily and he was counseled on a gastroparesis diet.  His symptoms now have resolved and he is without any significant upper or lower GI symptoms.  Of note, patient recently started dialysis and feels his GI symptoms improved after dialysis started.  He denies any involuntary movements, facial twitches, depression, or suicidal ideations.  As the symptoms are well controlled and he feels that dialysis had a large impact on GI symptoms, I would like to try decreasing his dose of Reglan to once a day.  He is currently without any side effects from Reglan but I want to be sure we are using the lowest effective dose.  Sent in new prescription for Reglan 5 mg daily before breakfast.  Requested nursing staff call his pharmacy and advise them of the change in medication as patient receives pill packs.  He has not picked up December pack yet. Reemphasized gastroparesis diet.  Handout provided. Advised he call us if his nausea, vomiting, or abdominal pain returns on the lower dose of Reglan. Counseled on Reglan side effects and advised he call us immediately if he develop any involuntary movements, facial twitches, depression, or suicidal thoughts. Continue taking Protonix 40 mg twice daily. Follow-up in 4-6 months.

## 2019-07-14 NOTE — Patient Instructions (Addendum)
WE will try decreasing your Reglan to once a day as you are currently doing very well. Please call us if you start to have return of nausea, vomiting, or abdominal pain.   Continue to follow low fat and low fiber diet for your gastroparesis. Eat 4-6 small meals a day. This will be important as we try to decrease your Reglan.  Continue to monitor for any unintentional movements or face twitching, depression or suicidal thoughts as we discussed today.   Continue Protonix 40 mg twice daily 30 minutes before breakfast and dinner.    Follow-up in 4-6 months. Call if questions or concerns prior.   Aliene Altes, PA-C [facility]

## 2019-07-14 NOTE — Assessment & Plan Note (Signed)
GERD symptoms are well controlled on Protonix twice daily.  Continue current medications.  Follow-up in 4-6 months.

## 2019-07-14 NOTE — Assessment & Plan Note (Signed)
Constipation has resolved.  Not having to use MiraLAX.  Patient feels a lot of his GI symptoms improved after starting dialysis.  Currently bowel movements daily.  Denies abdominal pain, BRBPR, melena, or unintentional weight loss.  He does need a colonoscopy as he has never had 1; however, patient wants to hold off on scheduling at this time.  Will consider colonoscopy at next office visit.  Follow-up in 4-6 months.

## 2019-07-15 ENCOUNTER — Other Ambulatory Visit
Admission: RE | Admit: 2019-07-15 | Discharge: 2019-07-15 | Disposition: A | Discharge status: Home / Self Care | Payer: Medicare HMO | Source: Ambulatory Visit | Attending: Vascular Surgery | Admitting: Vascular Surgery

## 2019-07-15 ENCOUNTER — Other Ambulatory Visit: Payer: Self-pay

## 2019-07-15 DIAGNOSIS — I129 Hypertensive chronic kidney disease with stage 1 through stage 4 chronic kidney disease, or unspecified chronic kidney disease: Secondary | ICD-10-CM | POA: Diagnosis not present

## 2019-07-15 DIAGNOSIS — N186 End stage renal disease: Secondary | ICD-10-CM | POA: Diagnosis not present

## 2019-07-15 DIAGNOSIS — E782 Mixed hyperlipidemia: Secondary | ICD-10-CM | POA: Diagnosis not present

## 2019-07-15 DIAGNOSIS — Z01812 Encounter for preprocedural laboratory examination: Secondary | ICD-10-CM | POA: Insufficient documentation

## 2019-07-15 DIAGNOSIS — Z20828 Contact with and (suspected) exposure to other viral communicable diseases: Secondary | ICD-10-CM | POA: Diagnosis not present

## 2019-07-15 DIAGNOSIS — Z992 Dependence on renal dialysis: Secondary | ICD-10-CM | POA: Diagnosis not present

## 2019-07-15 DIAGNOSIS — E1122 Type 2 diabetes mellitus with diabetic chronic kidney disease: Secondary | ICD-10-CM | POA: Diagnosis not present

## 2019-07-15 DIAGNOSIS — I10 Essential (primary) hypertension: Secondary | ICD-10-CM | POA: Diagnosis not present

## 2019-07-15 LAB — SARS CORONAVIRUS 2 (TAT 6-24 HRS): SARS Coronavirus 2: NEGATIVE

## 2019-07-18 ENCOUNTER — Other Ambulatory Visit (INDEPENDENT_AMBULATORY_CARE_PROVIDER_SITE_OTHER): Payer: Self-pay | Admitting: Nurse Practitioner

## 2019-07-18 DIAGNOSIS — N186 End stage renal disease: Secondary | ICD-10-CM | POA: Diagnosis not present

## 2019-07-18 DIAGNOSIS — Z992 Dependence on renal dialysis: Secondary | ICD-10-CM | POA: Diagnosis not present

## 2019-07-19 ENCOUNTER — Ambulatory Visit
Admission: RE | Admit: 2019-07-19 | Discharge: 2019-07-19 | Disposition: A | Discharge status: Home / Self Care | Payer: Medicare HMO | Attending: Vascular Surgery | Admitting: Vascular Surgery

## 2019-07-19 ENCOUNTER — Encounter: Payer: Self-pay | Admitting: Vascular Surgery

## 2019-07-19 ENCOUNTER — Encounter
Admission: RE | Disposition: A | Discharge status: Home / Self Care | Payer: Self-pay | Source: Home / Self Care | Attending: Vascular Surgery

## 2019-07-19 DIAGNOSIS — I509 Heart failure, unspecified: Secondary | ICD-10-CM | POA: Diagnosis not present

## 2019-07-19 DIAGNOSIS — Z79899 Other long term (current) drug therapy: Secondary | ICD-10-CM | POA: Diagnosis not present

## 2019-07-19 DIAGNOSIS — F419 Anxiety disorder, unspecified: Secondary | ICD-10-CM | POA: Diagnosis not present

## 2019-07-19 DIAGNOSIS — E1122 Type 2 diabetes mellitus with diabetic chronic kidney disease: Secondary | ICD-10-CM | POA: Insufficient documentation

## 2019-07-19 DIAGNOSIS — Z4901 Encounter for fitting and adjustment of extracorporeal dialysis catheter: Secondary | ICD-10-CM | POA: Diagnosis not present

## 2019-07-19 DIAGNOSIS — E1140 Type 2 diabetes mellitus with diabetic neuropathy, unspecified: Secondary | ICD-10-CM | POA: Diagnosis not present

## 2019-07-19 DIAGNOSIS — F329 Major depressive disorder, single episode, unspecified: Secondary | ICD-10-CM | POA: Insufficient documentation

## 2019-07-19 DIAGNOSIS — K219 Gastro-esophageal reflux disease without esophagitis: Secondary | ICD-10-CM | POA: Diagnosis not present

## 2019-07-19 DIAGNOSIS — I132 Hypertensive heart and chronic kidney disease with heart failure and with stage 5 chronic kidney disease, or end stage renal disease: Secondary | ICD-10-CM | POA: Insufficient documentation

## 2019-07-19 DIAGNOSIS — Z794 Long term (current) use of insulin: Secondary | ICD-10-CM | POA: Insufficient documentation

## 2019-07-19 DIAGNOSIS — Z992 Dependence on renal dialysis: Secondary | ICD-10-CM | POA: Diagnosis not present

## 2019-07-19 DIAGNOSIS — N186 End stage renal disease: Secondary | ICD-10-CM

## 2019-07-19 HISTORY — PX: DIALYSIS/PERMA CATHETER REMOVAL: CATH118289

## 2019-07-19 SURGERY — DIALYSIS/PERMA CATHETER REMOVAL
Anesthesia: LOCAL

## 2019-07-19 MED ORDER — LIDOCAINE-EPINEPHRINE (PF) 1 %-1:200000 IJ SOLN
INTRAMUSCULAR | Status: DC | PRN
  Administered 2019-07-19: 20 mL via INTRADERMAL

## 2019-07-19 SURGICAL SUPPLY — 5 items
DERMABOND ADVANCED (GAUZE/BANDAGES/DRESSINGS) ×1
DERMABOND ADVANCED .7 DNX12 (GAUZE/BANDAGES/DRESSINGS) ×1 IMPLANT
FORCEPS HALSTEAD CVD 5IN STRL (INSTRUMENTS) ×2 IMPLANT
SUT MNCRL AB 4-0 PS2 18 (SUTURE) ×2 IMPLANT
TRAY LACERAT/PLASTIC (MISCELLANEOUS) ×2 IMPLANT

## 2019-07-19 NOTE — Discharge Instructions (Signed)
Tunneled Catheter Removal, Care After °Refer to this sheet in the next few weeks. These instructions provide you with information about caring for yourself after your procedure. Your health care provider may also give you more specific instructions. Your treatment has been planned according to current medical practices, but problems sometimes occur. Call your health care provider if you have any problems or questions after your procedure. °What can I expect after the procedure? °After the procedure, it is common to have: °· Some mild redness, swelling, and pain around your catheter site. ° ° °Follow these instructions at home: °Incision care  °· Check your removal site  every day for signs of infection. Check for: °¨ More redness, swelling, or pain. °¨ More fluid or blood. °¨ Warmth. °¨ Pus or a bad smell. °· Follow instructions from your health care provider about how to take care of your removal site. Make sure you: °¨ Wash your hands with soap and water before you change your bandages (dressings). If soap and water are not available, use hand sanitizer. °Activity  °· Return to your normal activities as told by your health care provider. Ask your health care provider what activities are safe for you. °· Do not lift anything that is heavier than 10 lb (4.5 kg) for 3 weeks or as long as told by your health care provider. ° °Contact a health care provider if: °· You have more fluid or blood coming from your removal site °· You have more redness, swelling, or pain at your incisions or around the area where your catheter was removed °· Your removal site feel warm to the touch. °· You feel unusually weak. °· You feel nauseous.. °· Get help right away if °· You have swelling in your arm, shoulder, neck, or face. °· You develop chest pain. °· You have difficulty breathing. °· You feel dizzy or light-headed. °· You have pus or a bad smell coming from your removal site °· You have a fever. °· You develop bleeding from your  removal site, and your bleeding does not stop. °This information is not intended to replace advice given to you by your health care provider. Make sure you discuss any questions you have with your health care provider. °Document Released: 07/14/2012 Document Revised: 03/30/2016 Document Reviewed: 04/23/2015 °Elsevier Interactive Patient Education © 2017 Elsevier Inc. ° °

## 2019-07-19 NOTE — Op Note (Signed)
  OPERATIVE NOTE   PROCEDURE: 1. Removal of a right IJ tunneled dialysis catheter  PRE-OPERATIVE DIAGNOSIS: Complication of dialysis catheter  POST-OPERATIVE DIAGNOSIS: Same  SURGEON: [name]  ANESTHESIA: Local anesthetic with 1% lidocaine with epinephrine   ESTIMATED BLOOD LOSS: Minimal   FINDING(S): 1. Catheter intact   SPECIMEN(S):  Catheter  INDICATIONS:   Timothy Clark is a 48 y.o. male who presents with a functioning left forearm loop graft.  He therefore no longer needs his tunneled catheter.  In order to prevent septic complications his tunneled catheter is being removed.  Risk and benefits of been reviewed all questions answered patient agrees to proceed.  DESCRIPTION: After obtaining full informed written consent, the patient was positioned supine. The right IJ tunneled catheter and surrounding area is prepped and draped in a sterile fashion. The cuff was localized by palpation and noted to be greater than 3 cm from the exit site. After appropriate timeout is called, 1% lidocaine with epinephrine is infiltrated into the surrounding tissues around the cuff. Small transverse incision is created with an 11 blade scalpel and the dissection was carried down to expose the cuff of the tunneled catheter.  The catheter is then freed from the surrounding attachments and adhesions. Once the catheter has been freed circumferentially it is transected just distal to the cuff and subsequently removed in 2 pieces. Light pressure was held at the base of the neck. A 4-0 Monocryl was used close the tunnel in the subcutaneous space. The 4-0 Monocryl Monocryl was then used to close the skin in a subcuticular stitch. Dermabond is applied.  Antibiotic ointment and a sterile dressing is applied to the exit site. Patient tolerated procedure well and there were no complications.  COMPLICATIONS: None  CONDITION: Unchanged  [name]. [facility] Office:  [phone]  07/19/2019,3:16 PM

## 2019-07-20 DIAGNOSIS — N186 End stage renal disease: Secondary | ICD-10-CM | POA: Diagnosis not present

## 2019-07-20 DIAGNOSIS — Z992 Dependence on renal dialysis: Secondary | ICD-10-CM | POA: Diagnosis not present

## 2019-07-21 DIAGNOSIS — Z992 Dependence on renal dialysis: Secondary | ICD-10-CM | POA: Diagnosis not present

## 2019-07-21 DIAGNOSIS — N186 End stage renal disease: Secondary | ICD-10-CM | POA: Diagnosis not present

## 2019-07-21 DIAGNOSIS — I129 Hypertensive chronic kidney disease with stage 1 through stage 4 chronic kidney disease, or unspecified chronic kidney disease: Secondary | ICD-10-CM | POA: Diagnosis not present

## 2019-07-21 DIAGNOSIS — K3184 Gastroparesis: Secondary | ICD-10-CM | POA: Diagnosis not present

## 2019-07-21 DIAGNOSIS — Z6841 Body Mass Index (BMI) 40.0 and over, adult: Secondary | ICD-10-CM | POA: Diagnosis not present

## 2019-07-21 DIAGNOSIS — I10 Essential (primary) hypertension: Secondary | ICD-10-CM | POA: Diagnosis not present

## 2019-07-21 DIAGNOSIS — E1122 Type 2 diabetes mellitus with diabetic chronic kidney disease: Secondary | ICD-10-CM | POA: Diagnosis not present

## 2019-07-22 DIAGNOSIS — N186 End stage renal disease: Secondary | ICD-10-CM | POA: Diagnosis not present

## 2019-07-22 DIAGNOSIS — Z992 Dependence on renal dialysis: Secondary | ICD-10-CM | POA: Diagnosis not present

## 2019-07-25 DIAGNOSIS — N186 End stage renal disease: Secondary | ICD-10-CM | POA: Diagnosis not present

## 2019-07-25 DIAGNOSIS — Z992 Dependence on renal dialysis: Secondary | ICD-10-CM | POA: Diagnosis not present

## 2019-07-27 DIAGNOSIS — N186 End stage renal disease: Secondary | ICD-10-CM | POA: Diagnosis not present

## 2019-07-27 DIAGNOSIS — Z992 Dependence on renal dialysis: Secondary | ICD-10-CM | POA: Diagnosis not present

## 2019-07-29 DIAGNOSIS — Z992 Dependence on renal dialysis: Secondary | ICD-10-CM | POA: Diagnosis not present

## 2019-07-29 DIAGNOSIS — N186 End stage renal disease: Secondary | ICD-10-CM | POA: Diagnosis not present

## 2019-08-01 DIAGNOSIS — Z992 Dependence on renal dialysis: Secondary | ICD-10-CM | POA: Diagnosis not present

## 2019-08-01 DIAGNOSIS — N186 End stage renal disease: Secondary | ICD-10-CM | POA: Diagnosis not present

## 2019-08-03 DIAGNOSIS — N186 End stage renal disease: Secondary | ICD-10-CM | POA: Diagnosis not present

## 2019-08-03 DIAGNOSIS — Z992 Dependence on renal dialysis: Secondary | ICD-10-CM | POA: Diagnosis not present

## 2019-08-04 DIAGNOSIS — R0602 Shortness of breath: Secondary | ICD-10-CM | POA: Diagnosis not present

## 2019-08-04 DIAGNOSIS — R404 Transient alteration of awareness: Secondary | ICD-10-CM | POA: Diagnosis not present

## 2019-08-04 DIAGNOSIS — I10 Essential (primary) hypertension: Secondary | ICD-10-CM | POA: Diagnosis not present

## 2019-08-04 DIAGNOSIS — E785 Hyperlipidemia, unspecified: Secondary | ICD-10-CM | POA: Diagnosis not present

## 2019-08-04 DIAGNOSIS — R52 Pain, unspecified: Secondary | ICD-10-CM | POA: Diagnosis not present

## 2019-08-04 DIAGNOSIS — Z89411 Acquired absence of right great toe: Secondary | ICD-10-CM | POA: Diagnosis not present

## 2019-08-04 DIAGNOSIS — R1084 Generalized abdominal pain: Secondary | ICD-10-CM | POA: Diagnosis not present

## 2019-08-04 DIAGNOSIS — E119 Type 2 diabetes mellitus without complications: Secondary | ICD-10-CM | POA: Diagnosis not present

## 2019-08-04 DIAGNOSIS — R069 Unspecified abnormalities of breathing: Secondary | ICD-10-CM | POA: Diagnosis not present

## 2019-08-04 DIAGNOSIS — R109 Unspecified abdominal pain: Secondary | ICD-10-CM | POA: Diagnosis not present

## 2019-08-04 DIAGNOSIS — Z91013 Allergy to seafood: Secondary | ICD-10-CM | POA: Diagnosis not present

## 2019-08-04 DIAGNOSIS — R1013 Epigastric pain: Secondary | ICD-10-CM | POA: Diagnosis not present

## 2019-08-04 DIAGNOSIS — Z91048 Other nonmedicinal substance allergy status: Secondary | ICD-10-CM | POA: Diagnosis not present

## 2019-08-04 DIAGNOSIS — H5440 Blindness, one eye, unspecified eye: Secondary | ICD-10-CM | POA: Diagnosis not present

## 2019-08-04 DIAGNOSIS — Z886 Allergy status to analgesic agent status: Secondary | ICD-10-CM | POA: Diagnosis not present

## 2019-08-06 DIAGNOSIS — Z992 Dependence on renal dialysis: Secondary | ICD-10-CM | POA: Diagnosis not present

## 2019-08-06 DIAGNOSIS — N186 End stage renal disease: Secondary | ICD-10-CM | POA: Diagnosis not present

## 2019-08-07 DIAGNOSIS — Z01810 Encounter for preprocedural cardiovascular examination: Secondary | ICD-10-CM | POA: Diagnosis not present

## 2019-08-07 DIAGNOSIS — R0902 Hypoxemia: Secondary | ICD-10-CM | POA: Diagnosis not present

## 2019-08-07 DIAGNOSIS — N183 Chronic kidney disease, stage 3 unspecified: Secondary | ICD-10-CM | POA: Diagnosis not present

## 2019-08-07 DIAGNOSIS — J1282 Pneumonia due to coronavirus disease 2019: Secondary | ICD-10-CM | POA: Diagnosis not present

## 2019-08-07 DIAGNOSIS — R079 Chest pain, unspecified: Secondary | ICD-10-CM | POA: Diagnosis not present

## 2019-08-07 DIAGNOSIS — I1311 Hypertensive heart and chronic kidney disease without heart failure, with stage 5 chronic kidney disease, or end stage renal disease: Secondary | ICD-10-CM | POA: Diagnosis not present

## 2019-08-07 DIAGNOSIS — Z992 Dependence on renal dialysis: Secondary | ICD-10-CM | POA: Diagnosis not present

## 2019-08-07 DIAGNOSIS — Z0000 Encounter for general adult medical examination without abnormal findings: Secondary | ICD-10-CM | POA: Diagnosis not present

## 2019-08-07 DIAGNOSIS — Z452 Encounter for adjustment and management of vascular access device: Secondary | ICD-10-CM | POA: Diagnosis not present

## 2019-08-07 DIAGNOSIS — E119 Type 2 diabetes mellitus without complications: Secondary | ICD-10-CM | POA: Diagnosis not present

## 2019-08-07 DIAGNOSIS — I160 Hypertensive urgency: Secondary | ICD-10-CM | POA: Diagnosis not present

## 2019-08-07 DIAGNOSIS — R0789 Other chest pain: Secondary | ICD-10-CM | POA: Diagnosis not present

## 2019-08-07 DIAGNOSIS — R112 Nausea with vomiting, unspecified: Secondary | ICD-10-CM | POA: Diagnosis not present

## 2019-08-07 DIAGNOSIS — R918 Other nonspecific abnormal finding of lung field: Secondary | ICD-10-CM | POA: Diagnosis not present

## 2019-08-07 DIAGNOSIS — I129 Hypertensive chronic kidney disease with stage 1 through stage 4 chronic kidney disease, or unspecified chronic kidney disease: Secondary | ICD-10-CM | POA: Diagnosis not present

## 2019-08-07 DIAGNOSIS — I120 Hypertensive chronic kidney disease with stage 5 chronic kidney disease or end stage renal disease: Secondary | ICD-10-CM | POA: Diagnosis not present

## 2019-08-07 DIAGNOSIS — A419 Sepsis, unspecified organism: Secondary | ICD-10-CM | POA: Diagnosis not present

## 2019-08-07 DIAGNOSIS — E11649 Type 2 diabetes mellitus with hypoglycemia without coma: Secondary | ICD-10-CM | POA: Diagnosis not present

## 2019-08-07 DIAGNOSIS — R4182 Altered mental status, unspecified: Secondary | ICD-10-CM | POA: Diagnosis not present

## 2019-08-07 DIAGNOSIS — U071 COVID-19: Secondary | ICD-10-CM | POA: Diagnosis not present

## 2019-08-07 DIAGNOSIS — E782 Mixed hyperlipidemia: Secondary | ICD-10-CM | POA: Diagnosis not present

## 2019-08-07 DIAGNOSIS — N189 Chronic kidney disease, unspecified: Secondary | ICD-10-CM | POA: Diagnosis not present

## 2019-08-07 DIAGNOSIS — E785 Hyperlipidemia, unspecified: Secondary | ICD-10-CM | POA: Diagnosis not present

## 2019-08-07 DIAGNOSIS — I10 Essential (primary) hypertension: Secondary | ICD-10-CM | POA: Diagnosis not present

## 2019-08-07 DIAGNOSIS — J9601 Acute respiratory failure with hypoxia: Secondary | ICD-10-CM | POA: Diagnosis not present

## 2019-08-07 DIAGNOSIS — D631 Anemia in chronic kidney disease: Secondary | ICD-10-CM | POA: Diagnosis not present

## 2019-08-07 DIAGNOSIS — D649 Anemia, unspecified: Secondary | ICD-10-CM | POA: Diagnosis not present

## 2019-08-07 DIAGNOSIS — J189 Pneumonia, unspecified organism: Secondary | ICD-10-CM | POA: Diagnosis not present

## 2019-08-07 DIAGNOSIS — E1122 Type 2 diabetes mellitus with diabetic chronic kidney disease: Secondary | ICD-10-CM | POA: Diagnosis not present

## 2019-08-07 DIAGNOSIS — E876 Hypokalemia: Secondary | ICD-10-CM | POA: Diagnosis not present

## 2019-08-07 DIAGNOSIS — N186 End stage renal disease: Secondary | ICD-10-CM | POA: Diagnosis not present

## 2019-08-08 DIAGNOSIS — Z992 Dependence on renal dialysis: Secondary | ICD-10-CM | POA: Diagnosis not present

## 2019-08-08 DIAGNOSIS — N186 End stage renal disease: Secondary | ICD-10-CM | POA: Diagnosis not present

## 2019-08-11 DIAGNOSIS — N186 End stage renal disease: Secondary | ICD-10-CM | POA: Diagnosis not present

## 2019-08-11 DIAGNOSIS — Z992 Dependence on renal dialysis: Secondary | ICD-10-CM | POA: Diagnosis not present

## 2019-08-15 DIAGNOSIS — E1122 Type 2 diabetes mellitus with diabetic chronic kidney disease: Secondary | ICD-10-CM | POA: Diagnosis not present

## 2019-08-15 DIAGNOSIS — I10 Essential (primary) hypertension: Secondary | ICD-10-CM | POA: Diagnosis not present

## 2019-08-15 DIAGNOSIS — E782 Mixed hyperlipidemia: Secondary | ICD-10-CM | POA: Diagnosis not present

## 2019-08-15 DIAGNOSIS — N183 Chronic kidney disease, stage 3 unspecified: Secondary | ICD-10-CM | POA: Diagnosis not present

## 2019-08-15 DIAGNOSIS — I129 Hypertensive chronic kidney disease with stage 1 through stage 4 chronic kidney disease, or unspecified chronic kidney disease: Secondary | ICD-10-CM | POA: Diagnosis not present

## 2019-08-18 DIAGNOSIS — Z992 Dependence on renal dialysis: Secondary | ICD-10-CM | POA: Diagnosis not present

## 2019-08-18 DIAGNOSIS — N186 End stage renal disease: Secondary | ICD-10-CM | POA: Diagnosis not present

## 2019-08-19 ENCOUNTER — Other Ambulatory Visit: Payer: Self-pay

## 2019-08-19 NOTE — Patient Outreach (Signed)
[facility]) Care Management  08/19/2019  Timothy Clark 10/30/1970 [id number]   Referral Date: 08/19/19 Referral Source: [employer] Report Referral Reason: Discharge from [facility] 08/17/19  Outreach Attempt: Spoke with patient.  He acknowledges recent hospitalization.  He states he had COVID-19.  He states he feels better but weak at this time.    Social: He lives with sister.  He reports that he is independent with care but family takes him to appointments etc.     Conditions:Patient medical history of ESRD and currently on dialysis Tuesday, Thursday, and Saturday in [place].  He states he also has diabetes,HTN, GERD, and hyperlipidemia.  Patient denies problems with management of diabetes.  He does have some issues at times with his dialysis sites where they need to be revised.     Medications:  Medications reviewed.  Patient denies any needs with medications   Appointments:  Patient to see PCP in 2 weeks.    Advanced Directives: Patient does not have an advanced directive and declines wanting to start one.     Consent:  Discussed [facility] services and support with patient.  He declines further support and follow up as he has a lot right now.  Agreeable to receive letter and brochure however.     Plan: RN CM will close case and send letter.   Jone Baseman, RN, MSN [facility] Care Management Care Management Coordinator Direct Line [phone] Toll Free: [phone]  Fax: [phone]

## 2019-08-20 DIAGNOSIS — N186 End stage renal disease: Secondary | ICD-10-CM | POA: Diagnosis not present

## 2019-08-20 DIAGNOSIS — Z992 Dependence on renal dialysis: Secondary | ICD-10-CM | POA: Diagnosis not present

## 2019-08-21 DIAGNOSIS — G40109 Localization-related (focal) (partial) symptomatic epilepsy and epileptic syndromes with simple partial seizures, not intractable, without status epilepticus: Secondary | ICD-10-CM | POA: Diagnosis not present

## 2019-08-21 DIAGNOSIS — R404 Transient alteration of awareness: Secondary | ICD-10-CM | POA: Diagnosis not present

## 2019-08-21 DIAGNOSIS — I10 Essential (primary) hypertension: Secondary | ICD-10-CM | POA: Diagnosis not present

## 2019-08-21 DIAGNOSIS — I132 Hypertensive heart and chronic kidney disease with heart failure and with stage 5 chronic kidney disease, or end stage renal disease: Secondary | ICD-10-CM | POA: Diagnosis not present

## 2019-08-21 DIAGNOSIS — U071 COVID-19: Secondary | ICD-10-CM | POA: Diagnosis not present

## 2019-08-21 DIAGNOSIS — R569 Unspecified convulsions: Secondary | ICD-10-CM | POA: Diagnosis not present

## 2019-08-21 DIAGNOSIS — R079 Chest pain, unspecified: Secondary | ICD-10-CM | POA: Diagnosis not present

## 2019-08-21 DIAGNOSIS — G40911 Epilepsy, unspecified, intractable, with status epilepticus: Secondary | ICD-10-CM | POA: Diagnosis not present

## 2019-08-21 DIAGNOSIS — I491 Atrial premature depolarization: Secondary | ICD-10-CM | POA: Diagnosis not present

## 2019-08-21 DIAGNOSIS — R0789 Other chest pain: Secondary | ICD-10-CM | POA: Diagnosis not present

## 2019-08-21 DIAGNOSIS — E876 Hypokalemia: Secondary | ICD-10-CM | POA: Diagnosis not present

## 2019-08-21 DIAGNOSIS — N186 End stage renal disease: Secondary | ICD-10-CM | POA: Diagnosis not present

## 2019-08-21 DIAGNOSIS — G40909 Epilepsy, unspecified, not intractable, without status epilepticus: Secondary | ICD-10-CM | POA: Diagnosis not present

## 2019-08-21 DIAGNOSIS — E1122 Type 2 diabetes mellitus with diabetic chronic kidney disease: Secondary | ICD-10-CM | POA: Diagnosis not present

## 2019-08-21 DIAGNOSIS — F410 Panic disorder [episodic paroxysmal anxiety] without agoraphobia: Secondary | ICD-10-CM | POA: Diagnosis not present

## 2019-08-21 DIAGNOSIS — E1165 Type 2 diabetes mellitus with hyperglycemia: Secondary | ICD-10-CM | POA: Diagnosis not present

## 2019-08-21 DIAGNOSIS — R072 Precordial pain: Secondary | ICD-10-CM | POA: Diagnosis not present

## 2019-08-21 DIAGNOSIS — G4089 Other seizures: Secondary | ICD-10-CM | POA: Diagnosis not present

## 2019-08-21 DIAGNOSIS — I5032 Chronic diastolic (congestive) heart failure: Secondary | ICD-10-CM | POA: Diagnosis not present

## 2019-08-22 DIAGNOSIS — G40109 Localization-related (focal) (partial) symptomatic epilepsy and epileptic syndromes with simple partial seizures, not intractable, without status epilepticus: Secondary | ICD-10-CM | POA: Diagnosis not present

## 2019-08-23 DIAGNOSIS — Z992 Dependence on renal dialysis: Secondary | ICD-10-CM | POA: Diagnosis not present

## 2019-08-23 DIAGNOSIS — Z79899 Other long term (current) drug therapy: Secondary | ICD-10-CM | POA: Diagnosis not present

## 2019-08-23 DIAGNOSIS — N186 End stage renal disease: Secondary | ICD-10-CM | POA: Diagnosis not present

## 2019-08-24 DIAGNOSIS — F419 Anxiety disorder, unspecified: Secondary | ICD-10-CM | POA: Diagnosis not present

## 2019-08-25 ENCOUNTER — Other Ambulatory Visit: Payer: Self-pay | Admitting: *Deleted

## 2019-08-25 DIAGNOSIS — Z992 Dependence on renal dialysis: Secondary | ICD-10-CM | POA: Diagnosis not present

## 2019-08-25 DIAGNOSIS — N186 End stage renal disease: Secondary | ICD-10-CM | POA: Diagnosis not present

## 2019-08-25 NOTE — Patient Outreach (Signed)
[facility]) Care Management  08/25/2019  ROXY KEMME Apr 06, 1971 [id number]  Referral received: 08/24/2019 Referral Source: [employer] Report  Referral reason : Discharge from [facility] 08/22/19   Outreach Attempt #1 Unsuccessful outreach call, no answer able to leave a HIPAA compliant message for return call.   Plan Will plan return call in the next 4 business days, if no return call on today.  Will send patient unsuccessful outreach letter .   Joylene Draft, RN, [place] Management Coordinator  [phone]- Mobile [phone]- Toll Free Main Office

## 2019-08-27 DIAGNOSIS — Z992 Dependence on renal dialysis: Secondary | ICD-10-CM | POA: Diagnosis not present

## 2019-08-27 DIAGNOSIS — N186 End stage renal disease: Secondary | ICD-10-CM | POA: Diagnosis not present

## 2019-08-29 DIAGNOSIS — Z992 Dependence on renal dialysis: Secondary | ICD-10-CM | POA: Diagnosis not present

## 2019-08-29 DIAGNOSIS — N186 End stage renal disease: Secondary | ICD-10-CM | POA: Diagnosis not present

## 2019-08-30 ENCOUNTER — Other Ambulatory Visit: Payer: Self-pay

## 2019-08-30 NOTE — Patient Outreach (Signed)
[facility]) Care Management  08/30/2019  Timothy Clark [birth year] [id number]   Telephone call to patient for follow up hospitalization.  Patient states that he went to the hospital for a panic attack.  He states he was not admitted but kept overnight.  He states that he has followed up with his PCP and that his medication has changed.  Patient unable to review medications on call.  Patient again declined for CM follow up calls at this time but is agreeable to letter and brochure.   Plan: RN CM will close case.    Jone Baseman, RN, MSN [place] [facility] Management Care Management Coordinator Direct Line [phone] Cell [phone] Toll Free: [phone]  Fax: [phone]

## 2019-08-31 DIAGNOSIS — N186 End stage renal disease: Secondary | ICD-10-CM | POA: Diagnosis not present

## 2019-08-31 DIAGNOSIS — Z992 Dependence on renal dialysis: Secondary | ICD-10-CM | POA: Diagnosis not present

## 2019-09-01 ENCOUNTER — Telehealth: Payer: Self-pay | Admitting: Cardiology

## 2019-09-01 NOTE — Telephone Encounter (Signed)
Virtual Visit Pre-Appointment Phone Call  "(Name), I am calling you today to discuss your upcoming appointment. We are currently trying to limit exposure to the virus that causes COVID-19 by seeing patients at home rather than in the office."  "What is the BEST phone number to call the day of the visit?" -  [phone] 1. Do you have or have access to (through a family member/friend) a smartphone with video capability that we can use for your visit?" a. If yes - list this number in appt notes as cell (if different from BEST phone #) and list the appointment type as a VIDEO visit in appointment notes b. If no - list the appointment type as a PHONE visit in appointment notes  2. Confirm consent - "In the setting of the current Covid19 crisis, you are scheduled for a (phone or video) visit with your provider on (date) at (time).  Just as we do with many in-office visits, in order for you to participate in this visit, we must obtain consent.  If you'd like, I can send this to your mychart (if signed up) or email for you to review.  Otherwise, I can obtain your verbal consent now.  All virtual visits are billed to your insurance company just like a normal visit would be.  By agreeing to a virtual visit, we'd like you to understand that the technology does not allow for your provider to perform an examination, and thus may limit your provider's ability to fully assess your condition. If your provider identifies any concerns that need to be evaluated in person, we will make arrangements to do so.  Finally, though the technology is pretty good, we cannot assure that it will always work on either your or our end, and in the setting of a video visit, we may have to convert it to a phone-only visit.  In either situation, we cannot ensure that we have a secure connection.  Are you willing to proceed?" STAFF: Did the patient verbally acknowledge consent to telehealth visit? Document YES/NO here: YES    3. Advise patient to be prepared - "Two hours prior to your appointment, go ahead and check your blood pressure, pulse, oxygen saturation, and your weight (if you have the equipment to check those) and write them all down. When your visit starts, your provider will ask you for this information. If you have an Apple Watch or Kardia device, please plan to have heart rate information ready on the day of your appointment. Please have a pen and paper handy nearby the day of the visit as well."  4. Give patient instructions for MyChart download to smartphone OR Doximity/[email or web address] as below if video visit (depending on what platform provider is using)  5. Inform patient they will receive a phone call 15 minutes prior to their appointment time (may be from unknown caller ID) so they should be prepared to answer    TELEPHONE CALL NOTE  Timothy Clark has been deemed a candidate for a follow-up tele-health visit to limit community exposure during the Covid-19 pandemic. I spoke with the patient via phone to ensure availability of phone/video source, confirm preferred email & phone number, and discuss instructions and expectations.  I reminded Timothy Clark to be prepared with any vital sign and/or heart rhythm information that could potentially be obtained via home monitoring, at the time of his visit. I reminded Timothy Clark to expect a phone call prior to his visit.  [name] 09/01/2019 2:26 PM   INSTRUCTIONS FOR DOWNLOADING THE MYCHART APP TO SMARTPHONE  - The patient must first make sure to have activated MyChart and know their login information - If Apple, go to [employer] and type in MyChart in the search bar and download the app. If Android, ask patient to go to [employer] and type in [place] in the search bar and download the app. The app is free but as with any other app downloads, their phone may require them to verify saved payment information or Apple/Android password.   - The patient will need to then log into the app with their MyChart username and password, and select [facility] as their healthcare provider to link the account. When it is time for your visit, go to the MyChart app, find appointments, and click Begin Video Visit. Be sure to Select Allow for your device to access the Microphone and Camera for your visit. You will then be connected, and your provider will be with you shortly.  **If they have any issues connecting, or need assistance please contact MyChart service desk [phone])**  **If using a computer, in order to ensure the best quality for their visit they will need to use either of the following Internet Browsers: [employer], or Google Chrome**  IF USING DOXIMITY or DOXY.ME - The patient will receive a link just prior to their visit by text.     FULL LENGTH CONSENT FOR TELE-HEALTH VISIT   I hereby voluntarily request, consent and authorize [facility] and its employed or contracted physicians, physician assistants, nurse practitioners or other licensed health care professionals (the Practitioner), to provide me with telemedicine health care services (the Services") as deemed necessary by the treating Practitioner. I acknowledge and consent to receive the Services by the Practitioner via telemedicine. I understand that the telemedicine visit will involve communicating with the Practitioner through live audiovisual communication technology and the disclosure of certain medical information by electronic transmission. I acknowledge that I have been given the opportunity to request an in-person assessment or other available alternative prior to the telemedicine visit and am voluntarily participating in the telemedicine visit.  I understand that I have the right to withhold or withdraw my consent to the use of telemedicine in the course of my care at any time, without affecting my right to future care or treatment, and that  the Practitioner or I may terminate the telemedicine visit at any time. I understand that I have the right to inspect all information obtained and/or recorded in the course of the telemedicine visit and may receive copies of available information for a reasonable fee.  I understand that some of the potential risks of receiving the Services via telemedicine include:   Delay or interruption in medical evaluation due to technological equipment failure or disruption;  Information transmitted may not be sufficient (e.g. poor resolution of images) to allow for appropriate medical decision making by the Practitioner; and/or   In rare instances, security protocols could fail, causing a breach of personal health information.  Furthermore, I acknowledge that it is my responsibility to provide information about my medical history, conditions and care that is complete and accurate to the best of my ability. I acknowledge that Practitioner's advice, recommendations, and/or decision may be based on factors not within their control, such as incomplete or inaccurate data provided by me or distortions of diagnostic images or specimens that may result from electronic transmissions. I understand that the practice  of medicine is not an Chief Strategy Officer and that Practitioner makes no warranties or guarantees regarding treatment outcomes. I acknowledge that I will receive a copy of this consent concurrently upon execution via email to the email address I last provided but may also request a printed copy by calling the office of [facility].    I understand that my insurance will be billed for this visit.   I have read or had this consent read to me.  I understand the contents of this consent, which adequately explains the benefits and risks of the Services being provided via telemedicine.   I have been provided ample opportunity to ask questions regarding this consent and the Services and have had my questions answered to  my satisfaction.  I give my informed consent for the services to be provided through the use of telemedicine in my medical care  By participating in this telemedicine visit I agree to the above.

## 2019-09-02 DIAGNOSIS — N186 End stage renal disease: Secondary | ICD-10-CM | POA: Diagnosis not present

## 2019-09-02 DIAGNOSIS — Z992 Dependence on renal dialysis: Secondary | ICD-10-CM | POA: Diagnosis not present

## 2019-09-05 DIAGNOSIS — Z992 Dependence on renal dialysis: Secondary | ICD-10-CM | POA: Diagnosis not present

## 2019-09-05 DIAGNOSIS — N186 End stage renal disease: Secondary | ICD-10-CM | POA: Diagnosis not present

## 2019-09-07 DIAGNOSIS — N186 End stage renal disease: Secondary | ICD-10-CM | POA: Diagnosis not present

## 2019-09-07 DIAGNOSIS — Z992 Dependence on renal dialysis: Secondary | ICD-10-CM | POA: Diagnosis not present

## 2019-09-09 DIAGNOSIS — N186 End stage renal disease: Secondary | ICD-10-CM | POA: Diagnosis not present

## 2019-09-09 DIAGNOSIS — Z992 Dependence on renal dialysis: Secondary | ICD-10-CM | POA: Diagnosis not present

## 2019-09-11 DIAGNOSIS — Z992 Dependence on renal dialysis: Secondary | ICD-10-CM | POA: Diagnosis not present

## 2019-09-11 DIAGNOSIS — N186 End stage renal disease: Secondary | ICD-10-CM | POA: Diagnosis not present

## 2019-09-12 DIAGNOSIS — Z992 Dependence on renal dialysis: Secondary | ICD-10-CM | POA: Diagnosis not present

## 2019-09-12 DIAGNOSIS — N186 End stage renal disease: Secondary | ICD-10-CM | POA: Diagnosis not present

## 2019-09-14 DIAGNOSIS — N186 End stage renal disease: Secondary | ICD-10-CM | POA: Diagnosis not present

## 2019-09-14 DIAGNOSIS — Z992 Dependence on renal dialysis: Secondary | ICD-10-CM | POA: Diagnosis not present

## 2019-09-15 ENCOUNTER — Ambulatory Visit: Payer: Medicare HMO | Admitting: Cardiology

## 2019-09-16 DIAGNOSIS — Z992 Dependence on renal dialysis: Secondary | ICD-10-CM | POA: Diagnosis not present

## 2019-09-16 DIAGNOSIS — N186 End stage renal disease: Secondary | ICD-10-CM | POA: Diagnosis not present

## 2019-09-19 DIAGNOSIS — N186 End stage renal disease: Secondary | ICD-10-CM | POA: Diagnosis not present

## 2019-09-19 DIAGNOSIS — I871 Compression of vein: Secondary | ICD-10-CM | POA: Diagnosis not present

## 2019-09-19 DIAGNOSIS — E7849 Other hyperlipidemia: Secondary | ICD-10-CM | POA: Diagnosis not present

## 2019-09-19 DIAGNOSIS — E782 Mixed hyperlipidemia: Secondary | ICD-10-CM | POA: Diagnosis not present

## 2019-09-19 DIAGNOSIS — E1122 Type 2 diabetes mellitus with diabetic chronic kidney disease: Secondary | ICD-10-CM | POA: Diagnosis not present

## 2019-09-19 DIAGNOSIS — T82868A Thrombosis of vascular prosthetic devices, implants and grafts, initial encounter: Secondary | ICD-10-CM | POA: Diagnosis not present

## 2019-09-19 DIAGNOSIS — I10 Essential (primary) hypertension: Secondary | ICD-10-CM | POA: Diagnosis not present

## 2019-09-19 DIAGNOSIS — I129 Hypertensive chronic kidney disease with stage 1 through stage 4 chronic kidney disease, or unspecified chronic kidney disease: Secondary | ICD-10-CM | POA: Diagnosis not present

## 2019-09-19 DIAGNOSIS — Z992 Dependence on renal dialysis: Secondary | ICD-10-CM | POA: Diagnosis not present

## 2019-09-19 DIAGNOSIS — N183 Chronic kidney disease, stage 3 unspecified: Secondary | ICD-10-CM | POA: Diagnosis not present

## 2019-09-19 IMAGING — DX DG ABDOMEN ACUTE W/ 1V CHEST
4 series · 4 of 4 positions shown · non-contrast
Comparison: 02/10/2017 and earlier.

CLINICAL DATA: 46-year-old male with nausea vomiting and abdominal
pain since yesterday.

EXAM:
DG ABDOMEN ACUTE W/ 1V CHEST

[abdomen erect]
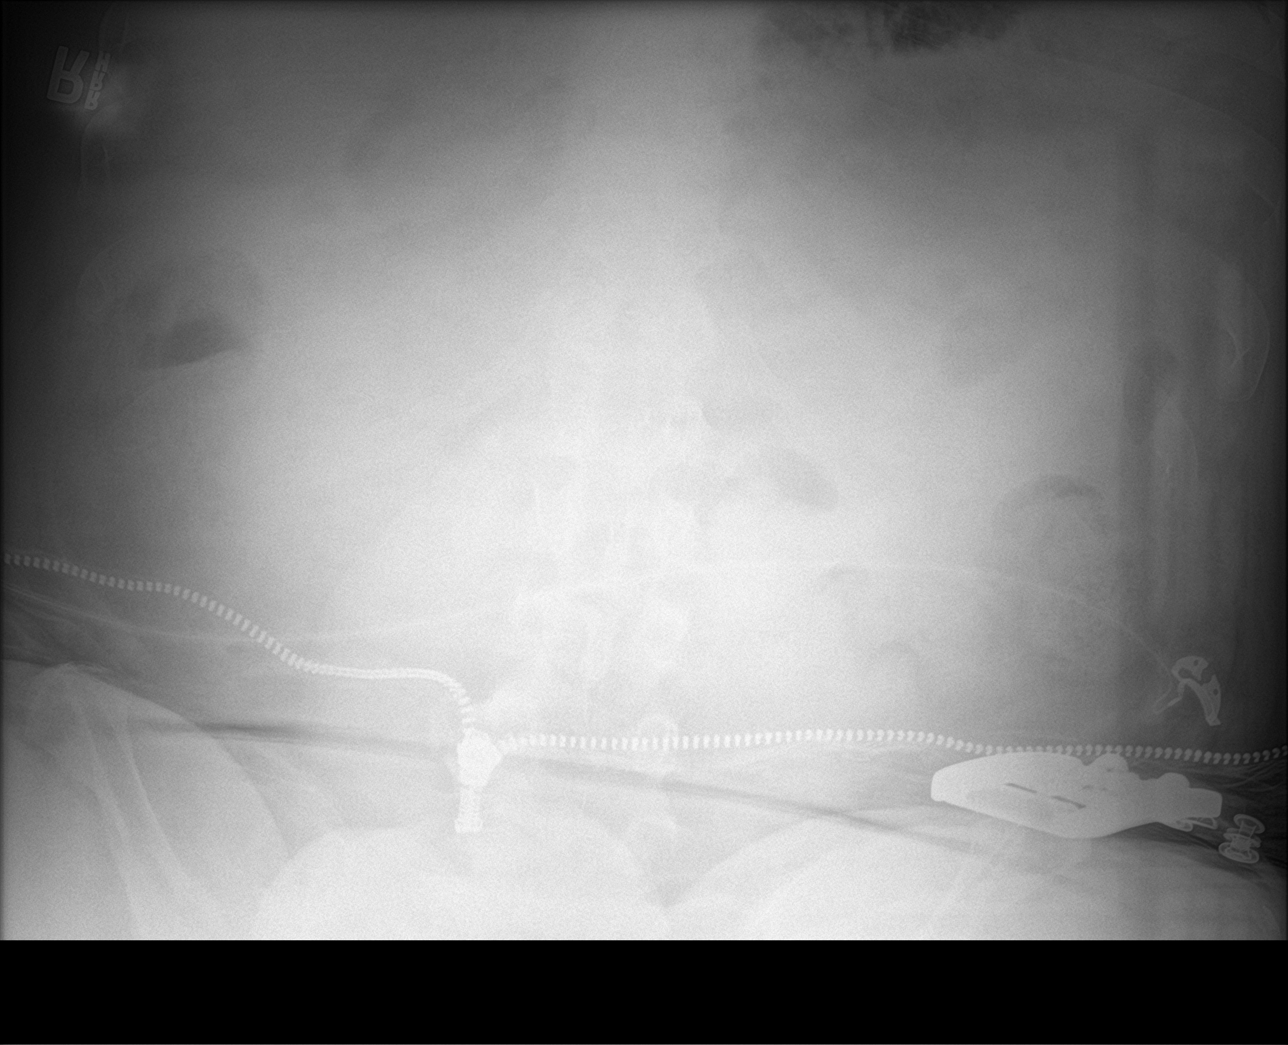

[abdomen supine (1 of 2)]
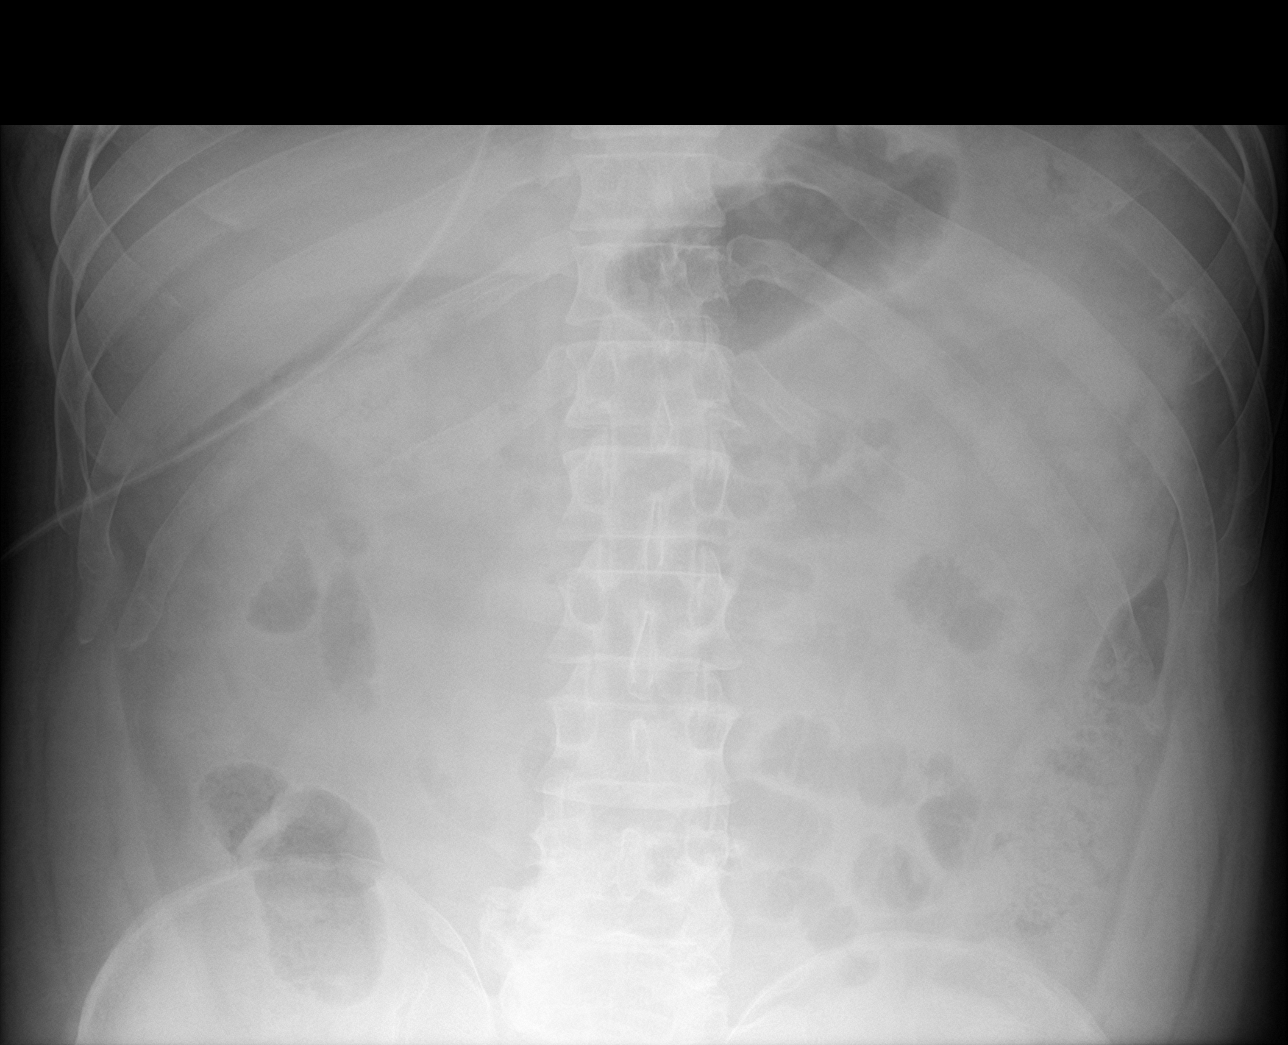

[abdomen supine (2 of 2)]
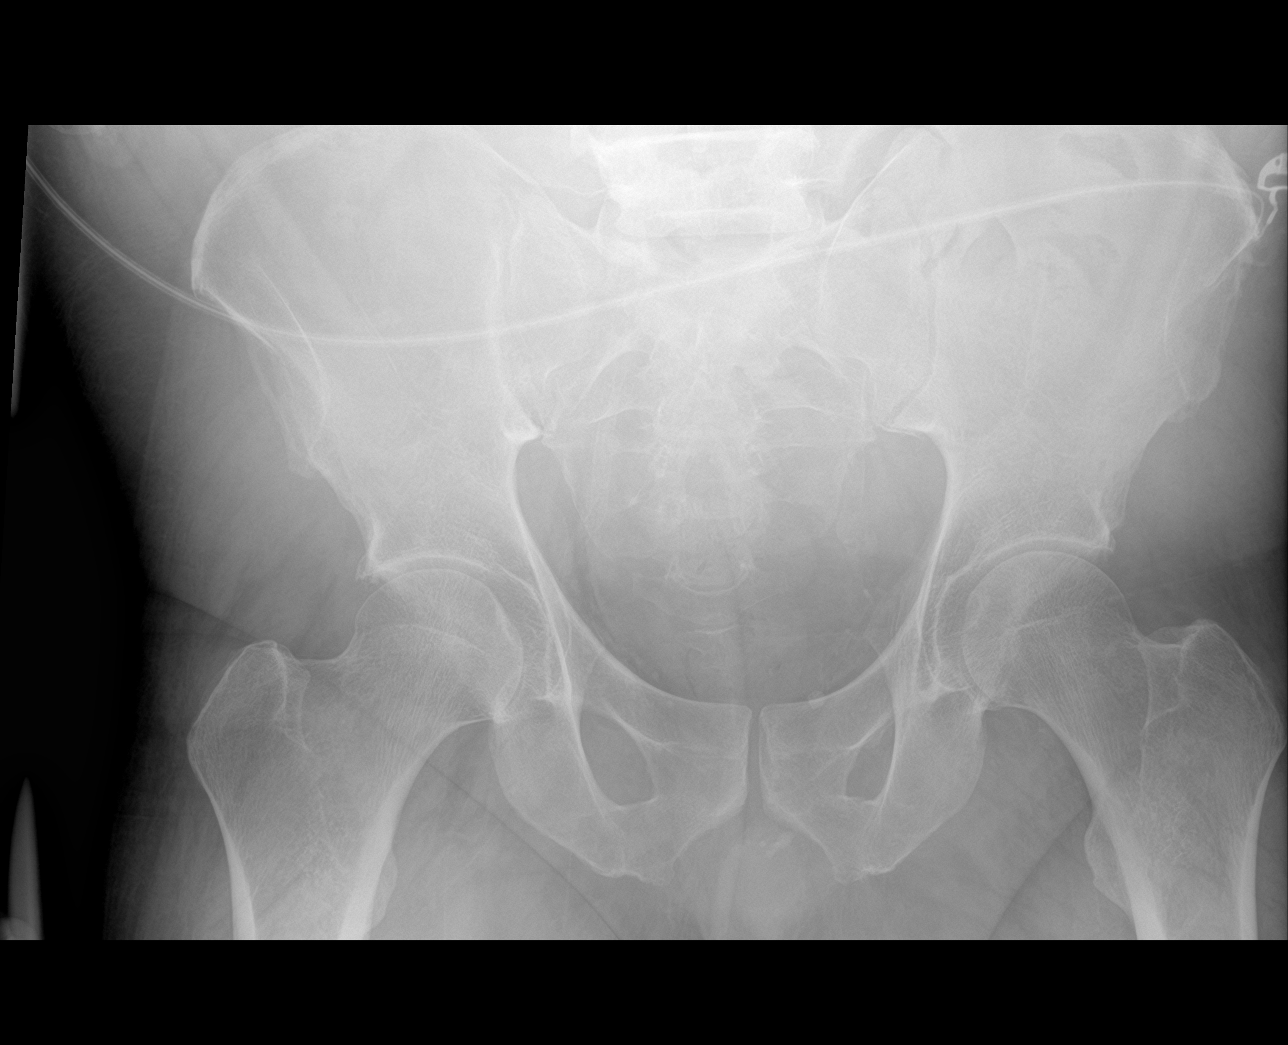

[chest ap]
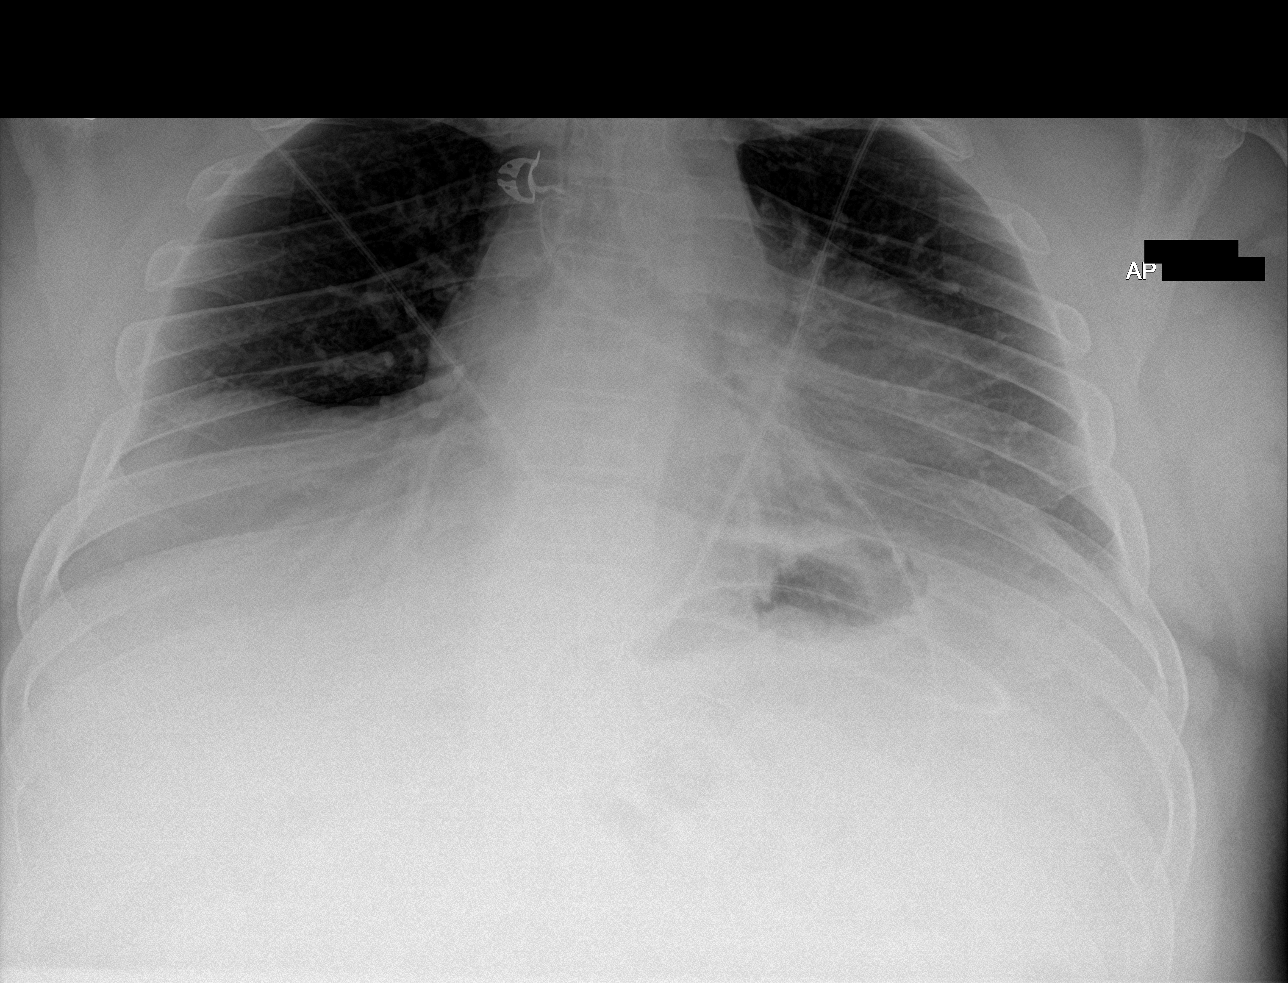

[4 of 4 positions shown; findings below may reference images not displayed]

FINDINGS: Seated upright AP view of the chest. Lordotic positioning.
Mediastinal contours and lung parenchyma suspected to be stable.
Visualized tracheal air column is within normal limits.

No pneumoperitoneum identified. Non obstructed bowel gas pattern.
Abdominal and pelvic visceral contours are within normal limits. No
acute osseous abnormality identified. Chronic right lateral L4-L5
endplate degeneration.
IMPRESSION: 1.  Normal bowel gas pattern, no free air.
2.  No acute cardiopulmonary abnormality.

## 2019-09-20 DIAGNOSIS — Z992 Dependence on renal dialysis: Secondary | ICD-10-CM | POA: Diagnosis not present

## 2019-09-20 DIAGNOSIS — N186 End stage renal disease: Secondary | ICD-10-CM | POA: Diagnosis not present

## 2019-09-21 DIAGNOSIS — Z992 Dependence on renal dialysis: Secondary | ICD-10-CM | POA: Diagnosis not present

## 2019-09-21 DIAGNOSIS — N186 End stage renal disease: Secondary | ICD-10-CM | POA: Diagnosis not present

## 2019-09-23 DIAGNOSIS — N186 End stage renal disease: Secondary | ICD-10-CM | POA: Diagnosis not present

## 2019-09-23 DIAGNOSIS — Z992 Dependence on renal dialysis: Secondary | ICD-10-CM | POA: Diagnosis not present

## 2019-09-26 ENCOUNTER — Telehealth (INDEPENDENT_AMBULATORY_CARE_PROVIDER_SITE_OTHER): Payer: Self-pay

## 2019-09-26 ENCOUNTER — Other Ambulatory Visit
Admission: RE | Admit: 2019-09-26 | Discharge: 2019-09-26 | Disposition: A | Discharge status: Home / Self Care | Payer: Medicare HMO | Source: Ambulatory Visit | Attending: Vascular Surgery | Admitting: Vascular Surgery

## 2019-09-26 ENCOUNTER — Other Ambulatory Visit (INDEPENDENT_AMBULATORY_CARE_PROVIDER_SITE_OTHER): Payer: Self-pay | Admitting: Nurse Practitioner

## 2019-09-26 ENCOUNTER — Ambulatory Visit
Admission: RE | Admit: 2019-09-26 | Discharge: 2019-09-26 | Disposition: A | Discharge status: Home / Self Care | Payer: Medicare HMO | Attending: Vascular Surgery | Admitting: Vascular Surgery

## 2019-09-26 ENCOUNTER — Other Ambulatory Visit: Payer: Self-pay

## 2019-09-26 DIAGNOSIS — Z20822 Contact with and (suspected) exposure to covid-19: Secondary | ICD-10-CM | POA: Diagnosis not present

## 2019-09-26 DIAGNOSIS — N186 End stage renal disease: Secondary | ICD-10-CM

## 2019-09-26 LAB — RESPIRATORY PANEL BY RT PCR (FLU A&B, COVID)
Influenza A by PCR: NEGATIVE
Influenza B by PCR: NEGATIVE
SARS Coronavirus 2 by RT PCR: NEGATIVE

## 2019-09-26 MED ORDER — CLINDAMYCIN PHOSPHATE 300 MG/50ML IV SOLN: 300.0000 mg | Freq: Once | INTRAVENOUS | Status: DC

## 2019-09-26 NOTE — Telephone Encounter (Signed)
Kristen from [facility] called stating the patient has a clotted access and needs a declot. I called to [facility] and was told to have the patient at the [facility] as soon as possible for covid testing and they will have the procedure following that. I contacted Cyril Mourning and gave her the information and she stated she would relay the information to the patient.

## 2019-09-26 NOTE — Progress Notes (Signed)
Pt present at [facility]. Covid test completed awaiting results. Pt scheduled to come tomorrow 2/16 Tuesday at 0800 for a 0900 start for left arm thrombectomy. Pt instructed not to eat or drink anything after midnight. Pt agreed to time will arrive at 0800 Tuesday.

## 2019-09-27 ENCOUNTER — Ambulatory Visit
Admission: RE | Admit: 2019-09-27 | Discharge: 2019-09-27 | Disposition: A | Discharge status: Home / Self Care | Payer: Medicare HMO | Source: Ambulatory Visit | Attending: Vascular Surgery | Admitting: Vascular Surgery

## 2019-09-27 ENCOUNTER — Other Ambulatory Visit: Payer: Self-pay

## 2019-09-27 ENCOUNTER — Encounter
Admission: RE | Disposition: A | Discharge status: Home / Self Care | Payer: Self-pay | Source: Home / Self Care | Attending: Vascular Surgery

## 2019-09-27 ENCOUNTER — Other Ambulatory Visit (INDEPENDENT_AMBULATORY_CARE_PROVIDER_SITE_OTHER): Payer: Self-pay | Admitting: Nurse Practitioner

## 2019-09-27 ENCOUNTER — Encounter
Admission: RE | Disposition: A | Discharge status: Home / Self Care | Payer: Self-pay | Source: Ambulatory Visit | Attending: Vascular Surgery

## 2019-09-27 ENCOUNTER — Encounter: Payer: Self-pay | Admitting: Vascular Surgery

## 2019-09-27 ENCOUNTER — Encounter: Payer: Self-pay | Admitting: Registered Nurse

## 2019-09-27 DIAGNOSIS — K219 Gastro-esophageal reflux disease without esophagitis: Secondary | ICD-10-CM | POA: Diagnosis not present

## 2019-09-27 DIAGNOSIS — Z8249 Family history of ischemic heart disease and other diseases of the circulatory system: Secondary | ICD-10-CM | POA: Insufficient documentation

## 2019-09-27 DIAGNOSIS — Z888 Allergy status to other drugs, medicaments and biological substances status: Secondary | ICD-10-CM | POA: Insufficient documentation

## 2019-09-27 DIAGNOSIS — Z880 Allergy status to penicillin: Secondary | ICD-10-CM | POA: Insufficient documentation

## 2019-09-27 DIAGNOSIS — Y832 Surgical operation with anastomosis, bypass or graft as the cause of abnormal reaction of the patient, or of later complication, without mention of misadventure at the time of the procedure: Secondary | ICD-10-CM | POA: Insufficient documentation

## 2019-09-27 DIAGNOSIS — E11649 Type 2 diabetes mellitus with hypoglycemia without coma: Secondary | ICD-10-CM | POA: Diagnosis not present

## 2019-09-27 DIAGNOSIS — H409 Unspecified glaucoma: Secondary | ICD-10-CM | POA: Insufficient documentation

## 2019-09-27 DIAGNOSIS — T8249XA Other complication of vascular dialysis catheter, initial encounter: Secondary | ICD-10-CM | POA: Diagnosis not present

## 2019-09-27 DIAGNOSIS — I509 Heart failure, unspecified: Secondary | ICD-10-CM | POA: Insufficient documentation

## 2019-09-27 DIAGNOSIS — Z881 Allergy status to other antibiotic agents status: Secondary | ICD-10-CM | POA: Diagnosis not present

## 2019-09-27 DIAGNOSIS — I132 Hypertensive heart and chronic kidney disease with heart failure and with stage 5 chronic kidney disease, or end stage renal disease: Secondary | ICD-10-CM | POA: Insufficient documentation

## 2019-09-27 DIAGNOSIS — T82868A Thrombosis of vascular prosthetic devices, implants and grafts, initial encounter: Secondary | ICD-10-CM | POA: Insufficient documentation

## 2019-09-27 DIAGNOSIS — I2510 Atherosclerotic heart disease of native coronary artery without angina pectoris: Secondary | ICD-10-CM | POA: Diagnosis not present

## 2019-09-27 DIAGNOSIS — T82898A Other specified complication of vascular prosthetic devices, implants and grafts, initial encounter: Secondary | ICD-10-CM | POA: Diagnosis not present

## 2019-09-27 DIAGNOSIS — N186 End stage renal disease: Secondary | ICD-10-CM

## 2019-09-27 DIAGNOSIS — Z833 Family history of diabetes mellitus: Secondary | ICD-10-CM | POA: Diagnosis not present

## 2019-09-27 DIAGNOSIS — Z89411 Acquired absence of right great toe: Secondary | ICD-10-CM | POA: Diagnosis not present

## 2019-09-27 DIAGNOSIS — K3184 Gastroparesis: Secondary | ICD-10-CM | POA: Diagnosis not present

## 2019-09-27 DIAGNOSIS — E1140 Type 2 diabetes mellitus with diabetic neuropathy, unspecified: Secondary | ICD-10-CM | POA: Diagnosis not present

## 2019-09-27 DIAGNOSIS — Z886 Allergy status to analgesic agent status: Secondary | ICD-10-CM | POA: Diagnosis not present

## 2019-09-27 DIAGNOSIS — Z992 Dependence on renal dialysis: Secondary | ICD-10-CM | POA: Insufficient documentation

## 2019-09-27 DIAGNOSIS — E1122 Type 2 diabetes mellitus with diabetic chronic kidney disease: Secondary | ICD-10-CM | POA: Diagnosis not present

## 2019-09-27 DIAGNOSIS — Z95828 Presence of other vascular implants and grafts: Secondary | ICD-10-CM | POA: Diagnosis not present

## 2019-09-27 HISTORY — PX: PERIPHERAL VASCULAR THROMBECTOMY: CATH118306

## 2019-09-27 LAB — POTASSIUM (ARMC VASCULAR LAB ONLY): Potassium (ARMC vascular lab): 3.9 (ref 3.5–5.1)

## 2019-09-27 LAB — GLUCOSE, CAPILLARY: Glucose-Capillary: 225 mg/dL — ABNORMAL HIGH (ref 70–99)

## 2019-09-27 SURGERY — PERIPHERAL VASCULAR THROMBECTOMY
Anesthesia: Moderate Sedation | Laterality: Left

## 2019-09-27 MED ORDER — HEPARIN SODIUM (PORCINE) 1000 UNIT/ML IJ SOLN
INTRAMUSCULAR | Status: DC | PRN
  Administered 2019-09-27: 4000 [IU] via INTRAVENOUS

## 2019-09-27 MED ORDER — MIDAZOLAM HCL 5 MG/5ML IJ SOLN
INTRAMUSCULAR | Status: AC
  Filled 2019-09-27: qty 5

## 2019-09-27 MED ORDER — DIPHENHYDRAMINE HCL 50 MG/ML IJ SOLN: 50.0000 mg | Freq: Once | INTRAMUSCULAR | Status: DC | PRN

## 2019-09-27 MED ORDER — LABETALOL HCL 5 MG/ML IV SOLN
INTRAVENOUS | Status: DC | PRN
  Administered 2019-09-27: 10 mg via INTRAVENOUS

## 2019-09-27 MED ORDER — CLINDAMYCIN PHOSPHATE 300 MG/50ML IV SOLN
INTRAVENOUS | Status: AC
  Administered 2019-09-27: 300 mg via INTRAVENOUS
  Filled 2019-09-27: qty 50

## 2019-09-27 MED ORDER — DIPHENHYDRAMINE HCL 50 MG/ML IJ SOLN
INTRAMUSCULAR | Status: AC
  Administered 2019-09-27: 10:00:00 25 mg via INTRAVENOUS
  Filled 2019-09-27: qty 1

## 2019-09-27 MED ORDER — LABETALOL HCL 5 MG/ML IV SOLN
INTRAVENOUS | Status: AC
  Filled 2019-09-27: qty 4

## 2019-09-27 MED ORDER — METHYLPREDNISOLONE SODIUM SUCC 125 MG IJ SOLR: 125.0000 mg | Freq: Once | INTRAMUSCULAR | Status: AC | PRN

## 2019-09-27 MED ORDER — ONDANSETRON HCL 4 MG/2ML IJ SOLN: 4.0000 mg | Freq: Four times a day (QID) | INTRAMUSCULAR | Status: DC | PRN

## 2019-09-27 MED ORDER — MIDAZOLAM HCL 2 MG/ML PO SYRP: 8.0000 mg | ORAL_SOLUTION | Freq: Once | ORAL | Status: DC | PRN

## 2019-09-27 MED ORDER — DIPHENHYDRAMINE HCL 50 MG/ML IJ SOLN: 50.0000 mg | Freq: Once | INTRAMUSCULAR | Status: AC | PRN

## 2019-09-27 MED ORDER — SODIUM CHLORIDE 0.9 % IV SOLN: INTRAVENOUS | Status: DC

## 2019-09-27 MED ORDER — HYDROMORPHONE HCL 1 MG/ML IJ SOLN: 1.0000 mg | Freq: Once | INTRAMUSCULAR | Status: DC | PRN

## 2019-09-27 MED ORDER — METHYLPREDNISOLONE SODIUM SUCC 125 MG IJ SOLR
INTRAMUSCULAR | Status: AC
  Administered 2019-09-27: 10:00:00 125 mg via INTRAVENOUS
  Filled 2019-09-27: qty 2

## 2019-09-27 MED ORDER — FENTANYL CITRATE (PF) 100 MCG/2ML IJ SOLN
INTRAMUSCULAR | Status: DC | PRN
  Administered 2019-09-27: 50 ug via INTRAVENOUS

## 2019-09-27 MED ORDER — FAMOTIDINE 20 MG PO TABS
40.0000 mg | ORAL_TABLET | Freq: Once | ORAL | Status: AC | PRN
  Administered 2019-09-27: 40 mg via ORAL

## 2019-09-27 MED ORDER — CLINDAMYCIN PHOSPHATE 300 MG/50ML IV SOLN: 300.0000 mg | Freq: Once | INTRAVENOUS | Status: DC

## 2019-09-27 MED ORDER — FAMOTIDINE 20 MG PO TABS
ORAL_TABLET | ORAL | Status: AC
  Filled 2019-09-27: qty 2

## 2019-09-27 MED ORDER — CLINDAMYCIN PHOSPHATE 300 MG/50ML IV SOLN: 300.0000 mg | Freq: Once | INTRAVENOUS | Status: AC

## 2019-09-27 MED ORDER — METHYLPREDNISOLONE SODIUM SUCC 125 MG IJ SOLR: 125.0000 mg | Freq: Once | INTRAMUSCULAR | Status: DC | PRN

## 2019-09-27 MED ORDER — FAMOTIDINE 20 MG PO TABS: 40.0000 mg | ORAL_TABLET | Freq: Once | ORAL | Status: DC | PRN

## 2019-09-27 MED ORDER — FENTANYL CITRATE (PF) 100 MCG/2ML IJ SOLN
INTRAMUSCULAR | Status: AC
  Filled 2019-09-27: qty 2

## 2019-09-27 MED ORDER — IODIXANOL 320 MG/ML IV SOLN
INTRAVENOUS | Status: DC | PRN
  Administered 2019-09-27: 65 mL

## 2019-09-27 MED ORDER — MIDAZOLAM HCL 2 MG/2ML IJ SOLN
INTRAMUSCULAR | Status: DC | PRN
  Administered 2019-09-27: 2 mg via INTRAVENOUS

## 2019-09-27 MED ORDER — HEPARIN SODIUM (PORCINE) 1000 UNIT/ML IJ SOLN
INTRAMUSCULAR | Status: AC
  Filled 2019-09-27: qty 1

## 2019-09-27 SURGICAL SUPPLY — 27 items
BALLN DORADO 7X40X80 (BALLOONS) ×2
BALLOON DORADO 7X40X80 (BALLOONS) ×1 IMPLANT
CANISTER PENUMBRA ENGINE (MISCELLANEOUS) ×2 IMPLANT
CATH BEACON 5 .035 65 KMP TIP (CATHETERS) ×2 IMPLANT
CATH INDIGO CAT6 KIT (CATHETERS) ×2 IMPLANT
CATH PIG 70CM (CATHETERS) ×2 IMPLANT
DEVICE PRESTO INFLATION (MISCELLANEOUS) ×2 IMPLANT
DEVICE TORQUE .025-.038 (MISCELLANEOUS) ×2 IMPLANT
DRAPE BRACHIAL (DRAPES) ×2 IMPLANT
GLIDEWIRE ADV .014X300CM (WIRE) ×2 IMPLANT
GLIDEWIRE STIFF .35X180X3 HYDR (WIRE) ×4 IMPLANT
KIT THROMB PERC PTD (MISCELLANEOUS) ×2 IMPLANT
NEEDLE ENTRY 21GA 7CM ECHOTIP (NEEDLE) ×2 IMPLANT
PACK ANGIOGRAPHY (CUSTOM PROCEDURE TRAY) ×2 IMPLANT
SET INTRO CAPELLA COAXIAL (SET/KITS/TRAYS/PACK) ×2 IMPLANT
SHEATH BRITE TIP 5FRX11 (SHEATH) ×2 IMPLANT
SHEATH BRITE TIP 6FRX5.5 (SHEATH) ×6 IMPLANT
SHEATH BRITE TIP 7FRX5.5 (SHEATH) ×2 IMPLANT
STENT VIABAHN 8X50X120 (Permanent Stent) ×2 IMPLANT
STENT VIABAHN5X120X8X (Permanent Stent) ×1 IMPLANT
SUT MNCRL 4-0 (SUTURE) ×1
SUT MNCRL 4-0 27XMFL (SUTURE) ×1
SUTURE MNCRL 4-0 27XMF (SUTURE) ×1 IMPLANT
TOWEL OR 17X26 4PK STRL BLUE (TOWEL DISPOSABLE) ×2 IMPLANT
TUBING CONTRAST HIGH PRESS 72 (TUBING) IMPLANT
WIRE G 018X200 V18 (WIRE) ×2 IMPLANT
WIRE J 3MM .035X145CM (WIRE) ×2 IMPLANT

## 2019-09-27 NOTE — Op Note (Signed)
OPERATIVE NOTE   PROCEDURE: 1. Mechanical thrombectomy of the left forearm AV loop graft with the Teratola device. 2. Mechanical thrombectomy of the brachial vein left upper arm with a CAT 6 device 3. Contrast injection left AV loop graft with central venous imaging 4. Percutaneous transluminal angioplasty and stent placement left basilic vein at the level of the distal upper arm  PRE-OPERATIVE DIAGNOSIS: Complication of dialysis access                                                       End Stage Renal Disease  POST-OPERATIVE DIAGNOSIS: same as above   SURGEON: Katha Cabal, M.D.  ANESTHESIA: Conscious sedation was administered by the radiology RN under my direct supervision. IV Versed plus fentanyl were utilized. Continuous ECG, pulse oximetry and blood pressure was monitored throughout the entire procedure. Conscious sedation was for a total of 70 minutes.    ESTIMATED BLOOD LOSS: minimal  FINDING(S): 1. Thrombus within the AV graft.  Since its creation there has been interval placement of a Viabahn stent in the venous outflow which extends approximately 10 cm into the basilic vein.  There is a string sign at the trailing edge of the Viabahn stent in the basilic vein.  Later in the case thrombus is also identified in a tributary crossing from the basilic to the brachial veins with thrombus extending into the brachial veins in the upper arm  SPECIMEN(S):  None  CONTRAST: 65 cc  FLUOROSCOPY TIME:            9.7 minutes  INDICATIONS: CORNEL Clark is a 49 y.o. male who  presents with thrombosis of his left arm AV access.  The patient is scheduled for angiography with possible intervention of the AV access.  The patient is aware the risks include but are not limited to: bleeding, infection, thrombosis of the cannulated access, and possible anaphylactic reaction to the contrast.  The patient acknowledges if the access can not be salvaged a tunneled catheter will be needed and  will be placed during this procedure.  The patient is aware of the risks of the procedure and elects to proceed with the angiogram and intervention.  DESCRIPTION: After full informed written consent was obtained, the patient was brought back to the Special Procedure suite and placed supine position.  Appropriate cardiopulmonary monitors were placed.  The left arm was prepped and draped in the standard fashion.  Appropriate timeout is called. The left forearm loop graft was cannulated with a micropuncture needle using ultrasound guidance.  With the ultrasound the AV access appeared to be filled with heterogeneous material and was poorly compressible indicating thrombosis of the AV access. The puncture was made under direct ultrasound visualization and an image was recorded for the permanent record.  The microwire was advanced and the needle was exchanged for  a microsheath.  The J-wire was then advanced and a 6 Fr sheath inserted.  Hand was then performed which demonstrated thrombus within the AV access.  The central venous structures were also imaged by hand injections.  4000 units of heparin was given and allowed to circulate as well.  A Trerotola device was then advanced beginning centrally and pulling back performing.  Several passes were made through the venous portion of the graft. Follow-up imaging now demonstrates the vast majority of  the clot had been treated. Therefore a retrograde sheath was inserted. This was a 6 [place] sheath and was positioned more proximally on the arm and angled in the retrograde direction. The forearm loop graft was cannulated with a micropuncture needle using ultrasound guidance.  With the ultrasound the AV access appeared to be filled with heterogeneous material and was poorly compressible indicating thrombosis of the AV access. The puncture was made under direct ultrasound visualization and an image was recorded for the permanent record.  Subsequently a floppy Glidewire and  a KMP catheter were negotiated into the arterial system hand injection contrast was then utilized to demonstrate patency of the artery as well as the location for the anastomosis. The Trerotola device was now advanced through the retrograde sheath was extended out into the artery the basket was opened and it was slowly pulled back into the graft and then the basket was engaged. Several passes were made on the arterial portion and pulsatility of the access was reestablished. Follow-up imaging demonstrates there was now thrombus in the venous portion surrounding the sheath and this was treated with the Trerotola device from the antegrade direction. After several passes imaging demonstrated resolution of thrombus within the graft and forward flow however stricture of the graft was noted in the trailing edge of the stent which is located in the distal upper arm.  The Viabahn has extended the loop graft into the basilic vein proper.  Also noted now that flow is been reestablished was thrombus within a crossing tributary that extended into the brachial vein which also represented 50% of the venous outflow for the AV graft.  Using the Kumpe catheter and a 0.014 advantage wire I negotiated the catheter wire into the brachial vein.  I then opened a penumbra CAT 6 device and perform mechanical thrombectomy of the brachial vein and the tributary.  Approximately 4 passes were made.  Follow-up imaging now demonstrated near total probably greater than 90% resolution of the thrombotic material and the venous outflow has now recruited the brachial vein back into the system.  I then upsized to a 7 [place] sheath.  A V 18 wire was then advanced across the basilic stricture and an 8 mm x 50 mm Viabahn stent deployed under fluoroscopic guidance.  It was postdilated with a 7 mm Dorado balloon inflated to 16 atm for 30 seconds.  Follow-up imaging demonstrated some residual stricture of the basilic vein downstream and I readvanced the  7 mm balloon inflated to 8 atm for 1 minute.  Follow-up imaging now demonstrated less than 5% residual stenosis.  With the 7 mm Dorado balloon inflated reflux of contrast was performed demonstrating the arterial anastomosis. The arterial anastomosis was widely patent no evidence of stricture stenosis no residual thrombus. Contrast was then injected in the forward direction demonstrating rapid flow.  A 4-0 Monocryl purse-string suture was sewn around both of the sheaths.  The sheaths were removed and light pressure was applied.  A sterile bandage was applied to the puncture site.    COMPLICATIONS: None  CONDITION: Timothy Clark, M.D [facility] Office: [phone]  09/27/2019 12:29 PM

## 2019-09-27 NOTE — H&P (Signed)
[facility] Admission History & Physical  MRN : [id number]  Timothy Clark is a 49 y.o. (01-26-1971) male who presents with chief complaint of No chief complaint on file. [name]  History of Present Illness: I am asked to evaluate the patient by the dialysis center. The patient was sent here because they were unable to cannulate the left forearm loop graft this morning. Furthermore the Center states there is no thrill or bruit. The patient states this is the first dialysis run to be missed. This problem is acute in onset and has been present for approximately 2 days. The patient is unaware of any other change.  Patient denies pain or tenderness overlying the access.  There is no pain with dialysis.  The patient denies hand pain or finger pain consistent with steal syndrome.   There have not had any past interventions or declots of this access.  The patient is not chronically hypotensive on dialysis.  Current Facility-Administered Medications  Medication Dose Route Frequency Provider Last Rate Last Admin  . 0.9 %  sodium chloride infusion   Intravenous Continuous Eulogio Ditch E, NP      . 0.9 %  sodium chloride infusion   Intravenous Continuous Eulogio Ditch E, NP      . clindamycin (CLEOCIN) 300 MG/50ML IVPB           . clindamycin (CLEOCIN) IVPB 300 mg  300 mg Intravenous Once Stegmayer, Kimberly A, PA-C      . clindamycin (CLEOCIN) IVPB 300 mg  300 mg Intravenous Once Eulogio Ditch E, NP      . diphenhydrAMINE (BENADRYL) injection 50 mg  50 mg Intravenous Once PRN Kris Hartmann, NP      . diphenhydrAMINE (BENADRYL) injection 50 mg  50 mg Intravenous Once PRN Kris Hartmann, NP      . famotidine (PEPCID) tablet 40 mg  40 mg Oral Once PRN Kris Hartmann, NP      . famotidine (PEPCID) tablet 40 mg  40 mg Oral Once PRN Kris Hartmann, NP      . HYDROmorphone (DILAUDID) injection 1 mg  1 mg Intravenous Once PRN Eulogio Ditch E, NP      . HYDROmorphone (DILAUDID)  injection 1 mg  1 mg Intravenous Once PRN Eulogio Ditch E, NP      . methylPREDNISolone sodium succinate (SOLU-MEDROL) 125 mg/2 mL injection 125 mg  125 mg Intravenous Once PRN Eulogio Ditch E, NP      . methylPREDNISolone sodium succinate (SOLU-MEDROL) 125 mg/2 mL injection 125 mg  125 mg Intravenous Once PRN Eulogio Ditch E, NP      . midazolam (VERSED) 2 MG/ML syrup 8 mg  8 mg Oral Once PRN Kris Hartmann, NP      . midazolam (VERSED) 2 MG/ML syrup 8 mg  8 mg Oral Once PRN Kris Hartmann, NP      . ondansetron (ZOFRAN) injection 4 mg  4 mg Intravenous Q6H PRN Kris Hartmann, NP      . ondansetron (ZOFRAN) injection 4 mg  4 mg Intravenous Q6H PRN Kris Hartmann, NP        Past Medical History:  Diagnosis Date  . Anxiety   . CHF (congestive heart failure) ([place])   . Chronic abdominal pain   . Depression   . Esophagitis   . ESRD on hemodialysis ([place])   . Eye hemorrhage   . Gastritis   . Gastroparesis   . GERD (  gastroesophageal reflux disease)   . Glaucoma   . Helicobacter pylori (H. pylori) infection 2016   ERADICATION DOCUMENTED VIA STOOL TEST in AUG 2016 at [facility]  . Hiccups   . Hypertension   . Nausea and vomiting    chronic, recurrent  . Neuropathy   . Renal insufficiency   . Type 2 diabetes mellitus with complications ([facility])    diagnosed around age 54    Past Surgical History:  Procedure Laterality Date  . AMPUTATION TOE Right    great toe  . AV FISTULA INSERTION W/ RF MAGNETIC GUIDANCE Left 04/06/2019   Procedure: AV FISTULA INSERTION W/RF MAGNETIC GUIDANCE;  Surgeon: Katha Cabal, MD;  Location: [facility];  Service: Cardiovascular;  Laterality: Left;  . AV FISTULA PLACEMENT Left 05/06/2019   Procedure: INSERTION OF ARTERIOVENOUS (AV) GORE-TEX GRAFT ARM ( FOREARM LOOP );  Surgeon: Katha Cabal, MD;  Location: [facility];  Service: Vascular;  Laterality: Left;  . CATARACT EXTRACTION W/PHACO Left 05/19/2016   Procedure: CATARACT EXTRACTION PHACO AND  INTRAOCULAR LENS PLACEMENT LEFT EYE;  Surgeon: Tonny Branch, MD;  Location: [facility];  Service: Ophthalmology;  Laterality: Left;  CDE: 7.30  . CATARACT EXTRACTION W/PHACO Right 06/23/2016   Procedure: CATARACT EXTRACTION PHACO AND INTRAOCULAR LENS PLACEMENT RIGHT EYE CDE=9.87;  Surgeon: Tonny Branch, MD;  Location: [facility];  Service: Ophthalmology;  Laterality: Right;  right  . DIALYSIS/PERMA CATHETER REMOVAL N/A 07/19/2019   Procedure: DIALYSIS/PERMA CATHETER REMOVAL;  Surgeon: Katha Cabal, MD;  Location: [facility];  Service: Cardiovascular;  Laterality: N/A;  . ESOPHAGOGASTRODUODENOSCOPY  2015   Dr. Britta Mccreedy  . ESOPHAGOGASTRODUODENOSCOPY N/A 10/26/2014   RMR: Distal esophagititis-likely reflux related although an element of pill induced injuruy no exclueded  status post biopsy. Diffusely abnormal gastric mucosa of uncertain signigicane -status post gastric biopsy. Focal area of excoriation in the cardia most consistant with a trauma of heaving.   . ESOPHAGOGASTRODUODENOSCOPY  08/2015   [facility]: mild esophagitis and old gastric contents  . EYE SURGERY  2015   stent placed to left eye  . EYE SURGERY  06/2019   per patient- to remove scar tissue  . INCISION AND DRAINAGE OF WOUND Right 07/16/2017   Procedure: IRRIGATION AND DEBRIDEMENT OF SOFT TISSUE OF ULCERATION RIGHT FOOT;  Surgeon: Caprice Beaver, DPM;  Location: [facility];  Service: Podiatry;  Laterality: Right;    Social History Social History   Tobacco Use  . Smoking status: Never Smoker  . Smokeless tobacco: Never Used  Substance Use Topics  . Alcohol use: No    Alcohol/week: 0.0 standard drinks  . Drug use: No    Family History Family History  Problem Relation Age of Onset  . Ovarian cancer Mother   . Heart attack Father   . Colon polyps Father   . Cervical cancer Sister   . Diabetes Sister   . Colon cancer Neg Hx     No family history of bleeding or clotting disorders, autoimmune disease or  porphyria  Allergies  Allergen Reactions  . Donnatal [Phenobarbital-Belladonna Alk] Anaphylaxis and Other (See Comments)    Reaction to GI cocktail  . Fish Allergy Anaphylaxis, Hives and Rash  . Haldol [Haloperidol Lactate] Other (See Comments)    Chest Pain  . Maalox [Calcium Carbonate Antacid] Anaphylaxis and Other (See Comments)    Reaction to GI cocktail  . Marinol [Dronabinol] Nausea And Vomiting and Other (See Comments)    Caused worsening vomiting.   [name]  Aspirin Hives and Itching  . Bactrim [Sulfamethoxazole-Trimethoprim] Itching  . Keflex [Cephalexin] Hives, Itching and Nausea And Vomiting  . Penicillins Hives, Itching and Other (See Comments)    Has patient had a PCN reaction causing immediate rash, facial/tongue/throat swelling, SOB or lightheadedness with hypotension: yes Has patient had a PCN reaction causing severe rash involving mucus membranes or skin necrosis: yes Has patient had a PCN reaction that required hospitalization no Has patient had a PCN reaction occurring within the last 10 years: yes If all of the above answers are "NO", then may proceed with Cephalospor     REVIEW OF SYSTEMS (Negative unless checked)  Constitutional: '[]' Weight loss  '[]' Fever  '[]' Chills Cardiac: '[]' Chest pain   '[]' Chest pressure   '[]' Palpitations   '[]' Shortness of breath when laying flat   '[]' Shortness of breath at rest   '[x]' Shortness of breath with exertion. Vascular:  '[]' Pain in legs with walking   '[]' Pain in legs at rest   '[]' Pain in legs when laying flat   '[]' Claudication   '[]' Pain in feet when walking  '[]' Pain in feet at rest  '[]' Pain in feet when laying flat   '[]' History of DVT   '[]' Phlebitis   '[]' Swelling in legs   '[]' Varicose veins   '[]' Non-healing ulcers Pulmonary:   '[]' Uses home oxygen   '[]' Productive cough   '[]' Hemoptysis   '[]' Wheeze  '[]' COPD   '[]' Asthma Neurologic:  '[]' Dizziness  '[]' Blackouts   '[]' Seizures   '[]' History of stroke   '[]' History of TIA  '[]' Aphasia   '[]' Temporary blindness   '[]' Dysphagia   '[]' Weakness  or numbness in arms   '[]' Weakness or numbness in legs Musculoskeletal:  '[]' Arthritis   '[]' Joint swelling   '[]' Joint pain   '[]' Low back pain Hematologic:  '[]' Easy bruising  '[]' Easy bleeding   '[]' Hypercoagulable state   '[]' Anemic  '[]' Hepatitis Gastrointestinal:  '[]' Blood in stool   '[]' Vomiting blood  '[]' Gastroesophageal reflux/heartburn   '[]' Difficulty swallowing. Genitourinary:  '[x]' Chronic kidney disease   '[]' Difficult urination  '[]' Frequent urination  '[]' Burning with urination   '[]' Blood in urine Skin:  '[]' Rashes   '[]' Ulcers   '[]' Wounds Psychological:  '[]' History of anxiety   '[]'  History of major depression.  Physical Examination  Vitals:   09/27/19 0955  BP: (!) 142/83  Pulse: 71  Resp: (!) 22  Temp: 98.4 F (36.9 C)  TempSrc: Oral  SpO2: 99%   There is no height or weight on file to calculate BMI. Gen: WD/WN, NAD Head: [place]/AT, No temporalis wasting. Prominent temp pulse not noted. Ear/Nose/Throat: Hearing grossly intact, nares w/o erythema or drainage, oropharynx w/o Erythema/Exudate,  Eyes: Conjunctiva clear, sclera non-icteric Neck: Trachea midline.  No JVD.  Pulmonary:  Good air movement, respirations not labored, no use of accessory muscles.  Cardiac: RRR, normal S1, S2. Vascular: left forearm loop graft no thrill no bruit Vessel Right Left  Radial Palpable Palpable  Ulnar Not Palpable Not Palpable  Brachial Palpable Palpable  Carotid Palpable, without bruit Palpable, without bruit  Gastrointestinal: soft, non-tender/non-distended. No guarding/reflex.  Musculoskeletal: M/S 5/5 throughout.  Extremities without ischemic changes.  No deformity or atrophy.  Neurologic: Sensation grossly intact in extremities.  Symmetrical.  Speech is fluent. Motor exam as listed above. Psychiatric: Judgment intact, Mood & affect appropriate for pt's clinical situation. Dermatologic: No rashes or ulcers noted.  No cellulitis or open wounds. Lymph : No Cervical, Axillary, or Inguinal lymphadenopathy.   CBC Lab  Results  Component Value Date   WBC 5.3 04/27/2019   HGB 8.8 (L) 05/06/2019   HCT 26.0 (  L) 05/06/2019   MCV 95.2 04/27/2019   PLT 123 (L) 04/27/2019    BMET    Component Value Date/Time   NA 138 05/06/2019 1020   K 4.1 05/06/2019 1020   CL 99 05/06/2019 1020   CO2 25 04/27/2019 1110   GLUCOSE 226 (H) 05/06/2019 1020   BUN 37 (H) 05/06/2019 1020   CREATININE 6.50 (H) 05/06/2019 1020   CREATININE 1.20 06/22/2017 1436   CALCIUM 8.7 (L) 04/27/2019 1110   GFRNONAA 11 (L) 04/27/2019 1110   GFRAA 12 (L) 04/27/2019 1110   CrCl cannot be calculated (Patient's most recent lab result is older than the maximum 21 days allowed.).  COAG Lab Results  Component Value Date   INR 1.1 04/27/2019   INR 1.2 03/29/2019   INR 1.1 10/25/2018    Radiology No results found.  Assessment/Plan 1.  Complication dialysis device with thrombosis AV access:  Patient's left forearm loop graft is thrombosed. The patient will undergo thrombectomy using interventional techniques.  The risks and benefits were described to the patient.  All questions were answered.  The patient agrees to proceed with angiography and intervention. Potassium will be drawn to ensure that it is an appropriate level prior to performing thrombectomy. 2.  End-stage renal disease requiring hemodialysis:  Patient will continue dialysis therapy without further interruption if a successful thrombectomy is not achieved then catheter will be placed. Dialysis has already been arranged since the patient missed their previous session 3.  Hypertension:  Patient will continue medical management; nephrology is following no changes in oral medications. 4. Diabetes mellitus:  Glucose will be monitored and oral medications been held this morning once the patient has undergone the patient's procedure po intake will be reinitiated and again Accu-Cheks will be used to assess the blood glucose level and treat as needed. The patient will be restarted on the  patient's usual hypoglycemic regime 5.  Coronary artery disease:  EKG will be monitored. Nitrates will be used if needed. The patient's oral cardiac medications will be continued.    [name], MD  09/27/2019 9:58 AM

## 2019-09-28 DIAGNOSIS — N186 End stage renal disease: Secondary | ICD-10-CM | POA: Diagnosis not present

## 2019-09-28 DIAGNOSIS — Z992 Dependence on renal dialysis: Secondary | ICD-10-CM | POA: Diagnosis not present

## 2019-09-30 DIAGNOSIS — N186 End stage renal disease: Secondary | ICD-10-CM | POA: Diagnosis not present

## 2019-09-30 DIAGNOSIS — Z992 Dependence on renal dialysis: Secondary | ICD-10-CM | POA: Diagnosis not present

## 2019-10-03 DIAGNOSIS — N186 End stage renal disease: Secondary | ICD-10-CM | POA: Diagnosis not present

## 2019-10-03 DIAGNOSIS — I871 Compression of vein: Secondary | ICD-10-CM | POA: Diagnosis not present

## 2019-10-03 DIAGNOSIS — Z992 Dependence on renal dialysis: Secondary | ICD-10-CM | POA: Diagnosis not present

## 2019-10-03 DIAGNOSIS — T82868A Thrombosis of vascular prosthetic devices, implants and grafts, initial encounter: Secondary | ICD-10-CM | POA: Diagnosis not present

## 2019-10-04 DIAGNOSIS — F419 Anxiety disorder, unspecified: Secondary | ICD-10-CM | POA: Diagnosis not present

## 2019-10-04 DIAGNOSIS — I129 Hypertensive chronic kidney disease with stage 1 through stage 4 chronic kidney disease, or unspecified chronic kidney disease: Secondary | ICD-10-CM | POA: Diagnosis not present

## 2019-10-04 DIAGNOSIS — N186 End stage renal disease: Secondary | ICD-10-CM | POA: Diagnosis not present

## 2019-10-04 DIAGNOSIS — Z992 Dependence on renal dialysis: Secondary | ICD-10-CM | POA: Diagnosis not present

## 2019-10-05 ENCOUNTER — Telehealth (INDEPENDENT_AMBULATORY_CARE_PROVIDER_SITE_OTHER): Payer: Self-pay

## 2019-10-05 ENCOUNTER — Encounter (INDEPENDENT_AMBULATORY_CARE_PROVIDER_SITE_OTHER): Payer: Self-pay

## 2019-10-05 DIAGNOSIS — Z992 Dependence on renal dialysis: Secondary | ICD-10-CM | POA: Diagnosis not present

## 2019-10-05 DIAGNOSIS — N186 End stage renal disease: Secondary | ICD-10-CM | POA: Diagnosis not present

## 2019-10-05 NOTE — Telephone Encounter (Signed)
A fax was received to have the patient scheduled for a evaluation of his clotted access. Per Dr. Lucky Cowboy the patient needs to be scheduled for a left lower declot. I contacted the patient and offered 10/07/19, 10/10/19, 10/13/19, 10/24/19 and 10/27/19 and the patient chose 10/27/19 to have his procedure with Dr. Lucky Cowboy. Patient will be scheduled for the day he requested and the information will be faxed to his dialysis center.

## 2019-10-06 ENCOUNTER — Telehealth (INDEPENDENT_AMBULATORY_CARE_PROVIDER_SITE_OTHER): Payer: Medicare HMO | Admitting: Cardiology

## 2019-10-06 ENCOUNTER — Encounter: Payer: Self-pay | Admitting: Cardiology

## 2019-10-06 VITALS — BP 147/82 | HR 72 | Ht 72.0 in | Wt 284.0 lb

## 2019-10-06 DIAGNOSIS — I10 Essential (primary) hypertension: Secondary | ICD-10-CM

## 2019-10-06 DIAGNOSIS — I5189 Other ill-defined heart diseases: Secondary | ICD-10-CM

## 2019-10-06 DIAGNOSIS — E782 Mixed hyperlipidemia: Secondary | ICD-10-CM | POA: Diagnosis not present

## 2019-10-06 NOTE — Progress Notes (Signed)
Virtual Visit via Telephone Note   This visit type was conducted due to national recommendations for restrictions regarding the COVID-19 Pandemic (e.g. social distancing) in an effort to limit this patient's exposure and mitigate transmission in our community.  Due to his co-morbid illnesses, this patient is at least at moderate risk for complications without adequate follow up.  This format is felt to be most appropriate for this patient at this time.  The patient did not have access to video technology/had technical difficulties with video requiring transitioning to audio format only (telephone).  All issues noted in this document were discussed and addressed.  No physical exam could be performed with this format.  Please refer to the patient's chart for his  consent to telehealth for [facility].   Date:  10/06/2019   ID:  Timothy Clark, DOB 1971-05-28, MRN [id number]  Patient Location: Home Provider Location: Office  PCP:  [name], MD  Cardiologist:  Dr Carlyle Dolly  Electrophysiologist:  None   Evaluation Performed:  Follow-Up Visit  Chief Complaint:  Follow up visit  History of Present Illness:    Timothy Clark is a 49 y.o. male seen today for follow up of the following medical problems.    1. DM2 - followed by pcp  2. Hyperlipidemia - followed by pcp, on statin  - compliant with statin   3. HTN - checks regularly at home. 140/70s at home after meds.  - he reports nephrology has been titrating meds. Reports 53m caused LE edema.   - home bp's before meds 140s/80s. Evening bp's 120s/60s - occasional low bp's during HD. Hydaralzine was stopped   4. Diastolic dysfunction - 110/2585echo: LVEF 60-65%, grade II diastolic dysfunction - fluid management primarily by HD, still make some urine and on torsemide.    5. ESRD -Followed by Dr SCandiss Norse- has started HD, has had some recent access problems.   The patient does not have symptoms  concerning for COVID-19 infection (fever, chills, cough, or new shortness of breath).    Past Medical History:  Diagnosis Date  . Anxiety   . [place]   . Chronic abdominal pain   . Depression   . Esophagitis   . [place] ([place]   . Eye hemorrhage   . Gastritis   . Gastroparesis   . GERD (gastroesophageal reflux disease)   . Glaucoma   . Helicobacter pylori (H. pylori) infection 2016   ER[place] . Hiccups   . Hypertension   . Nausea and vomiting    chronic, recurrent  . Neuropathy   . Renal insufficiency   . Type 2 diabetes mellitus with complications (HCC)    diagnosed around age 49  Past Surgical History:  Procedure Laterality Date  . AMPUTATION TOE Right    great toe  . AV FISTULA INSERTION W/ RF MAGNETIC GUIDANCE Left 04/06/2019   Procedure: AV FISTULA INSERTION W/RF MAGNETIC GUIDANCE;  Surgeon: SKatha Cabal MD;  [facility];  Service: Cardiovascular;  Laterality: Left;  . AV FISTULA PLACEMENT Left 05/06/2019   Procedure: INSERTION OF ARTERIOVENOUS (AV) GORE-TEX GRAFT ARM ( FOREARM LOOP );  Surgeon: SKatha Cabal MD;  Location: [facility];  Service: Vascular;  Laterality: Left;  . CATARACT EXTRACTION W/PHACO Left 05/19/2016   Procedure: CATARACT EXTRACTION PHACO AND INTRAOCULAR LENS PLACEMENT LEFT EYE;  Surgeon: KTonny Lavelle Akel MD;  [facility];  Service: Ophthalmology;  Laterality: Left;  CDE: 7.30  . CATARACT EXTRACTION W/PHACO Right 06/23/2016   Procedure: CATARACT EXTRACTION PHACO AND INTRAOCULAR LENS PLACEMENT RIGHT EYE CDE=9.87;  Surgeon: Tonny , MD;  [facility];  Service: Ophthalmology;  Laterality: Right;  right  . DIALYSIS/PERMA CATHETER REMOVAL N/A 07/19/2019   Procedure: DIALYSIS/PERMA CATHETER REMOVAL;  Surgeon: Katha Cabal, MD;  Location: [place] [facility];  Service: Cardiovascular;  Laterality: N/A;  .  ESOPHAGOGASTRODUODENOSCOPY  2015   Dr. Britta Mccreedy  . ESOPHAGOGASTRODUODENOSCOPY N/A 10/26/2014   RMR: Distal esophagititis-likely reflux related although an element of pill induced injuruy no exclueded  status post biopsy. Diffusely abnormal gastric mucosa of uncertain signigicane -status post gastric biopsy. Focal area of excoriation in the cardia most consistant with a trauma of heaving.   . ESOPHAGOGASTRODUODENOSCOPY  08/2015   [facility]: mild esophagitis and old gastric contents  . EYE SURGERY  2015   stent placed to left eye  . EYE SURGERY  06/2019   per patient- to remove scar tissue  . INCISION AND DRAINAGE OF WOUND Right 07/16/2017   Procedure: IRRIGATION AND DEBRIDEMENT OF SOFT TISSUE OF ULCERATION RIGHT FOOT;  Surgeon: Caprice Beaver, DPM;  [facility];  Service: Podiatry;  Laterality: Right;  . PERIPHERAL VASCULAR THROMBECTOMY Left 09/27/2019   Procedure: PERIPHERAL VASCULAR THROMBECTOMY;  Surgeon: Katha Cabal, MD;  Location: [place] [facility];  Service: Cardiovascular;  Laterality: Left;     Current Meds  Medication Sig  . amLODipine (NORVASC) 5 MG tablet Take 5 mg by mouth daily.  [name] atorvastatin (LIPITOR) 10 MG tablet Take 10 mg by mouth at bedtime.   . brimonidine (ALPHAGAN) 0.15 % ophthalmic solution Place 1 drop into both eyes 3 (three) times daily.   . carvedilol (COREG) 25 MG tablet Take 25 mg by mouth 2 (two) times daily with a meal.  . cloNIDine (CATAPRES) 0.2 MG tablet Take 0.2 mg by mouth 3 (three) times daily.   . cyanocobalamin (,VITAMIN B-12,) 1000 MCG/ML injection Inject 1,000 mcg into the muscle every 30 (thirty) days.   . folic acid (FOLVITE) 1 MG tablet Take 1 mg by mouth daily.  . hydrALAZINE (APRESOLINE) 100 MG tablet Take 100 mg by mouth 3 (three) times daily.  [name] losartan (COZAAR) 50 MG tablet Take 50 mg by mouth at bedtime.   . metoCLOPramide (REGLAN) 5 MG tablet Take 1 tablet (5 mg total) by mouth daily before breakfast.  . NOVOLOG MIX 70/30  FLEXPEN (70-30) 100 UNIT/ML FlexPen 25-28 Units by Subconjunctival route See admin instructions. Inject 28 units subcutaneously in the morning & inject 25 units subcutaneously in the evening.  . pantoprazole (PROTONIX) 40 MG tablet Take 1 tablet (40 mg total) by mouth 2 (two) times daily before a meal.  . potassium chloride SA (K-DUR) 20 MEQ tablet Take 20 mEq by mouth daily at 12 noon.   . promethazine (PHENERGAN) 25 MG tablet Take 12.5 mg by mouth every 6 (six) hours as needed for nausea or vomiting.   . sitaGLIPtin (JANUVIA) 25 MG tablet Take 25 mg by mouth daily.   . sucralfate (CARAFATE) 1 g tablet Take 1 g by mouth 4 (four) times daily.   . timolol (TIMOPTIC) 0.5 % ophthalmic solution Place 1 drop into both eyes 2 (two) times daily.  [name] torsemide (DEMADEX) 20 MG tablet Take 40 mg by mouth daily at 12 noon.      Allergies:   Donnatal [phenobarbital-belladonna alk], Fish allergy, Haldol [  haloperidol lactate], Maalox [calcium carbonate antacid], Shellfish allergy, Marinol [dronabinol], Aspirin, Bactrim [sulfamethoxazole-trimethoprim], Keflex [cephalexin], and Penicillins   Social History   Tobacco Use  . Smoking status: Never Smoker  . Smokeless tobacco: Never Used  Substance Use Topics  . Alcohol use: No    Alcohol/week: 0.0 standard drinks  . Drug use: No     Family Hx: The patient's family history includes Cervical cancer in his sister; Colon polyps in his father; Diabetes in his sister; Heart attack in his father; Ovarian cancer in his mother. There is no history of Colon cancer.  ROS:   Please see the history of present illness.     All other systems reviewed and are negative.   Prior CV studies:   The following studies were reviewed today:  07/2017 echo Study Conclusions   - Left ventricle: The cavity size was normal. Systolic function was  normal. The estimated ejection fraction was in the range of 60%  to 65%. Wall motion was normal; there were no regional wall    motion abnormalities. Features are consistent with a pseudonormal  left ventricular filling pattern, with concomitant abnormal  relaxation and increased filling pressure (grade 2 diastolic  dysfunction). Doppler parameters are consistent with high  ventricular filling pressure. Mild concentric and moderate focal  basal septal hypertrophy.  - Aortic valve: Trileaflet; mildly thickened, mildly calcified  leaflets.  - Mitral valve: Mildly thickened leaflets .   Labs/Other Tests and Data Reviewed:    EKG:  No ECG reviewed.  Recent Labs: 10/24/2018: ALT 11 10/26/2018: Magnesium 1.8 04/27/2019: Platelets 123 05/06/2019: BUN 37; Creatinine, Ser 6.50; Hemoglobin 8.8; Potassium 4.1; Sodium 138   Recent Lipid Panel Lab Results  Component Value Date/Time   CHOL 155 06/14/2018 02:11 AM   TRIG 141 06/14/2018 02:11 AM   HDL 30 (L) 06/14/2018 02:11 AM   CHOLHDL 5.2 06/14/2018 02:11 AM   LDLCALC 97 06/14/2018 02:11 AM   LDLDIRECT 142.1 10/19/2009 04:29 PM    Wt Readings from Last 3 Encounters:  10/06/19 284 lb (128.8 kg)  09/27/19 279 lb 15.8 oz (127 kg)  07/14/19 296 lb 9.6 oz (134.5 kg)     Objective:    Vital Signs:  BP (!) 147/82   Pulse 72   Ht 6' (1.829 m)   Wt 284 lb (128.8 kg)   BMI 38.52 kg/m    Normal affect. Normal speech pattern and tone. Comfortable, no apparent distress. No audible signs of sob or wheezing.   ASSESSMENT & PLAN:    1. HTN - home bp's at goal - continue current meds  2. Hyperlipidemia - compliant with statin - request pcp and dialysis center labs    3. Diastolic dysfunction - fluid management primarily with HD, does make some urine and is on torsemide - continue current management   COVID-19 Education: The signs and symptoms of COVID-19 were discussed with the patient and how to seek care for testing (follow up with PCP or arrange E-visit).  The importance of social distancing was discussed today.  Time:   Today, I have  spent 22 minutes with the patient with telehealth technology discussing the above problems.     Medication Adjustments/Labs and Tests Ordered: Current medicines are reviewed at length with the patient today.  Concerns regarding medicines are outlined above.   Tests Ordered: No orders of the defined types were placed in this encounter.   Medication Changes: No orders of the defined types were placed in this encounter.  Follow Up:  Either In Person or Virtual in 1 year(s)  Signed, Carlyle Dolly, MD  10/06/2019 8:11 AM    [facility]

## 2019-10-06 NOTE — Addendum Note (Signed)
Addended by: Debbora Lacrosse R on: 10/06/2019 08:44 AM   Modules accepted: Orders

## 2019-10-06 NOTE — Patient Instructions (Signed)
Medication Instructions:  Your physician recommends that you continue on your current medications as directed. Please refer to the Current Medication list given to you today.   Labwork: I will request labs from pcp/ [facility]   Testing/Procedures: none  Follow-Up: Your physician wants you to follow-up in: 1 year. You will receive a reminder letter in the mail two months in advance. If you don't receive a letter, please call our office to schedule the follow-up appointment.'  Any Other Special Instructions Will Be Listed Below (If Applicable).     If you need a refill on your cardiac medications before your next appointment, please call your pharmacy.

## 2019-10-07 DIAGNOSIS — Z992 Dependence on renal dialysis: Secondary | ICD-10-CM | POA: Diagnosis not present

## 2019-10-07 DIAGNOSIS — N186 End stage renal disease: Secondary | ICD-10-CM | POA: Diagnosis not present

## 2019-10-09 DIAGNOSIS — Z992 Dependence on renal dialysis: Secondary | ICD-10-CM | POA: Diagnosis not present

## 2019-10-09 DIAGNOSIS — N186 End stage renal disease: Secondary | ICD-10-CM | POA: Diagnosis not present

## 2019-10-10 DIAGNOSIS — N186 End stage renal disease: Secondary | ICD-10-CM | POA: Diagnosis not present

## 2019-10-10 DIAGNOSIS — Z23 Encounter for immunization: Secondary | ICD-10-CM | POA: Diagnosis not present

## 2019-10-10 DIAGNOSIS — Z992 Dependence on renal dialysis: Secondary | ICD-10-CM | POA: Diagnosis not present

## 2019-10-12 DIAGNOSIS — Z23 Encounter for immunization: Secondary | ICD-10-CM | POA: Diagnosis not present

## 2019-10-12 DIAGNOSIS — Z992 Dependence on renal dialysis: Secondary | ICD-10-CM | POA: Diagnosis not present

## 2019-10-12 DIAGNOSIS — N186 End stage renal disease: Secondary | ICD-10-CM | POA: Diagnosis not present

## 2019-10-13 DIAGNOSIS — I10 Essential (primary) hypertension: Secondary | ICD-10-CM | POA: Diagnosis not present

## 2019-10-13 DIAGNOSIS — E1165 Type 2 diabetes mellitus with hyperglycemia: Secondary | ICD-10-CM | POA: Diagnosis not present

## 2019-10-13 DIAGNOSIS — Z114 Encounter for screening for human immunodeficiency virus [HIV]: Secondary | ICD-10-CM | POA: Diagnosis not present

## 2019-10-13 DIAGNOSIS — Z1159 Encounter for screening for other viral diseases: Secondary | ICD-10-CM | POA: Diagnosis not present

## 2019-10-13 DIAGNOSIS — Z01818 Encounter for other preprocedural examination: Secondary | ICD-10-CM | POA: Diagnosis not present

## 2019-10-13 DIAGNOSIS — Z452 Encounter for adjustment and management of vascular access device: Secondary | ICD-10-CM | POA: Diagnosis not present

## 2019-10-13 DIAGNOSIS — Z7682 Awaiting organ transplant status: Secondary | ICD-10-CM | POA: Diagnosis not present

## 2019-10-13 DIAGNOSIS — I120 Hypertensive chronic kidney disease with stage 5 chronic kidney disease or end stage renal disease: Secondary | ICD-10-CM | POA: Diagnosis not present

## 2019-10-13 DIAGNOSIS — Z992 Dependence on renal dialysis: Secondary | ICD-10-CM | POA: Diagnosis not present

## 2019-10-13 DIAGNOSIS — E1140 Type 2 diabetes mellitus with diabetic neuropathy, unspecified: Secondary | ICD-10-CM | POA: Diagnosis not present

## 2019-10-13 DIAGNOSIS — N186 End stage renal disease: Secondary | ICD-10-CM | POA: Diagnosis not present

## 2019-10-14 DIAGNOSIS — Z23 Encounter for immunization: Secondary | ICD-10-CM | POA: Diagnosis not present

## 2019-10-14 DIAGNOSIS — N186 End stage renal disease: Secondary | ICD-10-CM | POA: Diagnosis not present

## 2019-10-14 DIAGNOSIS — Z992 Dependence on renal dialysis: Secondary | ICD-10-CM | POA: Diagnosis not present

## 2019-10-17 DIAGNOSIS — I10 Essential (primary) hypertension: Secondary | ICD-10-CM | POA: Diagnosis not present

## 2019-10-17 DIAGNOSIS — N186 End stage renal disease: Secondary | ICD-10-CM | POA: Diagnosis not present

## 2019-10-17 DIAGNOSIS — N183 Chronic kidney disease, stage 3 unspecified: Secondary | ICD-10-CM | POA: Diagnosis not present

## 2019-10-17 DIAGNOSIS — E782 Mixed hyperlipidemia: Secondary | ICD-10-CM | POA: Diagnosis not present

## 2019-10-17 DIAGNOSIS — I129 Hypertensive chronic kidney disease with stage 1 through stage 4 chronic kidney disease, or unspecified chronic kidney disease: Secondary | ICD-10-CM | POA: Diagnosis not present

## 2019-10-17 DIAGNOSIS — Z992 Dependence on renal dialysis: Secondary | ICD-10-CM | POA: Diagnosis not present

## 2019-10-17 DIAGNOSIS — Z23 Encounter for immunization: Secondary | ICD-10-CM | POA: Diagnosis not present

## 2019-10-17 DIAGNOSIS — E1122 Type 2 diabetes mellitus with diabetic chronic kidney disease: Secondary | ICD-10-CM | POA: Diagnosis not present

## 2019-10-17 DIAGNOSIS — E7849 Other hyperlipidemia: Secondary | ICD-10-CM | POA: Diagnosis not present

## 2019-10-18 ENCOUNTER — Other Ambulatory Visit (INDEPENDENT_AMBULATORY_CARE_PROVIDER_SITE_OTHER): Payer: Self-pay | Admitting: Vascular Surgery

## 2019-10-18 DIAGNOSIS — E113593 Type 2 diabetes mellitus with proliferative diabetic retinopathy without macular edema, bilateral: Secondary | ICD-10-CM | POA: Diagnosis not present

## 2019-10-18 DIAGNOSIS — Z95820 Peripheral vascular angioplasty status with implants and grafts: Secondary | ICD-10-CM

## 2019-10-18 DIAGNOSIS — Z9889 Other specified postprocedural states: Secondary | ICD-10-CM | POA: Diagnosis not present

## 2019-10-18 DIAGNOSIS — Z961 Presence of intraocular lens: Secondary | ICD-10-CM | POA: Diagnosis not present

## 2019-10-18 DIAGNOSIS — H401133 Primary open-angle glaucoma, bilateral, severe stage: Secondary | ICD-10-CM | POA: Diagnosis not present

## 2019-10-19 DIAGNOSIS — Z992 Dependence on renal dialysis: Secondary | ICD-10-CM | POA: Diagnosis not present

## 2019-10-19 DIAGNOSIS — N186 End stage renal disease: Secondary | ICD-10-CM | POA: Diagnosis not present

## 2019-10-19 DIAGNOSIS — Z23 Encounter for immunization: Secondary | ICD-10-CM | POA: Diagnosis not present

## 2019-10-20 ENCOUNTER — Ambulatory Visit (INDEPENDENT_AMBULATORY_CARE_PROVIDER_SITE_OTHER): Payer: Medicare HMO | Admitting: Vascular Surgery

## 2019-10-20 ENCOUNTER — Encounter (INDEPENDENT_AMBULATORY_CARE_PROVIDER_SITE_OTHER): Payer: Medicare HMO

## 2019-10-21 DIAGNOSIS — Z992 Dependence on renal dialysis: Secondary | ICD-10-CM | POA: Diagnosis not present

## 2019-10-21 DIAGNOSIS — Z23 Encounter for immunization: Secondary | ICD-10-CM | POA: Diagnosis not present

## 2019-10-21 DIAGNOSIS — N186 End stage renal disease: Secondary | ICD-10-CM | POA: Diagnosis not present

## 2019-10-24 DIAGNOSIS — N186 End stage renal disease: Secondary | ICD-10-CM | POA: Diagnosis not present

## 2019-10-24 DIAGNOSIS — Z23 Encounter for immunization: Secondary | ICD-10-CM | POA: Diagnosis not present

## 2019-10-24 DIAGNOSIS — Z992 Dependence on renal dialysis: Secondary | ICD-10-CM | POA: Diagnosis not present

## 2019-10-25 ENCOUNTER — Other Ambulatory Visit: Payer: Self-pay

## 2019-10-25 ENCOUNTER — Other Ambulatory Visit
Admission: RE | Admit: 2019-10-25 | Discharge: 2019-10-25 | Disposition: A | Discharge status: Home / Self Care | Payer: Medicare HMO | Source: Ambulatory Visit | Attending: Vascular Surgery | Admitting: Vascular Surgery

## 2019-10-25 DIAGNOSIS — Z01812 Encounter for preprocedural laboratory examination: Secondary | ICD-10-CM | POA: Diagnosis not present

## 2019-10-25 DIAGNOSIS — Z20822 Contact with and (suspected) exposure to covid-19: Secondary | ICD-10-CM | POA: Insufficient documentation

## 2019-10-26 ENCOUNTER — Other Ambulatory Visit (INDEPENDENT_AMBULATORY_CARE_PROVIDER_SITE_OTHER): Payer: Self-pay | Admitting: Nurse Practitioner

## 2019-10-26 DIAGNOSIS — N186 End stage renal disease: Secondary | ICD-10-CM | POA: Diagnosis not present

## 2019-10-26 DIAGNOSIS — Z992 Dependence on renal dialysis: Secondary | ICD-10-CM | POA: Diagnosis not present

## 2019-10-26 DIAGNOSIS — Z23 Encounter for immunization: Secondary | ICD-10-CM | POA: Diagnosis not present

## 2019-10-26 LAB — SARS CORONAVIRUS 2 (TAT 6-24 HRS): SARS Coronavirus 2: NEGATIVE

## 2019-10-26 MED ORDER — CLINDAMYCIN PHOSPHATE 300 MG/50ML IV SOLN: 300.0000 mg | Freq: Once | INTRAVENOUS | Status: DC

## 2019-10-27 ENCOUNTER — Encounter: Payer: Self-pay | Admitting: Cardiology

## 2019-10-27 ENCOUNTER — Other Ambulatory Visit: Payer: Self-pay

## 2019-10-27 ENCOUNTER — Encounter
Admission: RE | Disposition: A | Discharge status: Home / Self Care | Payer: Self-pay | Source: Home / Self Care | Attending: Vascular Surgery

## 2019-10-27 ENCOUNTER — Ambulatory Visit
Admission: RE | Admit: 2019-10-27 | Discharge: 2019-10-27 | Disposition: A | Discharge status: Home / Self Care | Payer: Medicare HMO | Attending: Vascular Surgery | Admitting: Vascular Surgery

## 2019-10-27 DIAGNOSIS — F329 Major depressive disorder, single episode, unspecified: Secondary | ICD-10-CM | POA: Insufficient documentation

## 2019-10-27 DIAGNOSIS — N178 Other acute kidney failure: Secondary | ICD-10-CM | POA: Diagnosis not present

## 2019-10-27 DIAGNOSIS — N186 End stage renal disease: Secondary | ICD-10-CM | POA: Diagnosis not present

## 2019-10-27 DIAGNOSIS — Z794 Long term (current) use of insulin: Secondary | ICD-10-CM | POA: Diagnosis not present

## 2019-10-27 DIAGNOSIS — Z992 Dependence on renal dialysis: Secondary | ICD-10-CM | POA: Diagnosis not present

## 2019-10-27 DIAGNOSIS — I358 Other nonrheumatic aortic valve disorders: Secondary | ICD-10-CM | POA: Diagnosis not present

## 2019-10-27 DIAGNOSIS — E1122 Type 2 diabetes mellitus with diabetic chronic kidney disease: Secondary | ICD-10-CM | POA: Insufficient documentation

## 2019-10-27 DIAGNOSIS — I10 Essential (primary) hypertension: Secondary | ICD-10-CM | POA: Diagnosis not present

## 2019-10-27 DIAGNOSIS — K3184 Gastroparesis: Secondary | ICD-10-CM | POA: Diagnosis not present

## 2019-10-27 DIAGNOSIS — E11319 Type 2 diabetes mellitus with unspecified diabetic retinopathy without macular edema: Secondary | ICD-10-CM | POA: Diagnosis not present

## 2019-10-27 DIAGNOSIS — R509 Fever, unspecified: Secondary | ICD-10-CM | POA: Diagnosis not present

## 2019-10-27 DIAGNOSIS — Z20822 Contact with and (suspected) exposure to covid-19: Secondary | ICD-10-CM | POA: Diagnosis not present

## 2019-10-27 DIAGNOSIS — Z886 Allergy status to analgesic agent status: Secondary | ICD-10-CM | POA: Insufficient documentation

## 2019-10-27 DIAGNOSIS — T82868A Thrombosis of vascular prosthetic devices, implants and grafts, initial encounter: Secondary | ICD-10-CM | POA: Insufficient documentation

## 2019-10-27 DIAGNOSIS — R52 Pain, unspecified: Secondary | ICD-10-CM | POA: Diagnosis not present

## 2019-10-27 DIAGNOSIS — I132 Hypertensive heart and chronic kidney disease with heart failure and with stage 5 chronic kidney disease, or end stage renal disease: Secondary | ICD-10-CM | POA: Insufficient documentation

## 2019-10-27 DIAGNOSIS — R109 Unspecified abdominal pain: Secondary | ICD-10-CM | POA: Diagnosis not present

## 2019-10-27 DIAGNOSIS — R112 Nausea with vomiting, unspecified: Secondary | ICD-10-CM | POA: Diagnosis not present

## 2019-10-27 DIAGNOSIS — E1169 Type 2 diabetes mellitus with other specified complication: Secondary | ICD-10-CM | POA: Diagnosis not present

## 2019-10-27 DIAGNOSIS — E1165 Type 2 diabetes mellitus with hyperglycemia: Secondary | ICD-10-CM | POA: Diagnosis not present

## 2019-10-27 DIAGNOSIS — I340 Nonrheumatic mitral (valve) insufficiency: Secondary | ICD-10-CM | POA: Diagnosis not present

## 2019-10-27 DIAGNOSIS — Y841 Kidney dialysis as the cause of abnormal reaction of the patient, or of later complication, without mention of misadventure at the time of the procedure: Secondary | ICD-10-CM | POA: Insufficient documentation

## 2019-10-27 DIAGNOSIS — R4182 Altered mental status, unspecified: Secondary | ICD-10-CM | POA: Diagnosis not present

## 2019-10-27 DIAGNOSIS — E669 Obesity, unspecified: Secondary | ICD-10-CM | POA: Diagnosis not present

## 2019-10-27 DIAGNOSIS — I509 Heart failure, unspecified: Secondary | ICD-10-CM | POA: Diagnosis not present

## 2019-10-27 DIAGNOSIS — E1110 Type 2 diabetes mellitus with ketoacidosis without coma: Secondary | ICD-10-CM | POA: Diagnosis not present

## 2019-10-27 DIAGNOSIS — Z881 Allergy status to other antibiotic agents status: Secondary | ICD-10-CM | POA: Insufficient documentation

## 2019-10-27 DIAGNOSIS — E872 Acidosis: Secondary | ICD-10-CM | POA: Diagnosis not present

## 2019-10-27 DIAGNOSIS — F419 Anxiety disorder, unspecified: Secondary | ICD-10-CM | POA: Insufficient documentation

## 2019-10-27 DIAGNOSIS — I5032 Chronic diastolic (congestive) heart failure: Secondary | ICD-10-CM | POA: Diagnosis not present

## 2019-10-27 DIAGNOSIS — R110 Nausea: Secondary | ICD-10-CM | POA: Diagnosis not present

## 2019-10-27 DIAGNOSIS — Z880 Allergy status to penicillin: Secondary | ICD-10-CM | POA: Diagnosis not present

## 2019-10-27 DIAGNOSIS — R739 Hyperglycemia, unspecified: Secondary | ICD-10-CM | POA: Diagnosis not present

## 2019-10-27 DIAGNOSIS — E1140 Type 2 diabetes mellitus with diabetic neuropathy, unspecified: Secondary | ICD-10-CM | POA: Diagnosis not present

## 2019-10-27 DIAGNOSIS — R000 Tachycardia, unspecified: Secondary | ICD-10-CM | POA: Diagnosis not present

## 2019-10-27 DIAGNOSIS — E1151 Type 2 diabetes mellitus with diabetic peripheral angiopathy without gangrene: Secondary | ICD-10-CM | POA: Diagnosis not present

## 2019-10-27 DIAGNOSIS — R1084 Generalized abdominal pain: Secondary | ICD-10-CM | POA: Diagnosis not present

## 2019-10-27 DIAGNOSIS — I75019 Atheroembolism of unspecified upper extremity: Secondary | ICD-10-CM | POA: Diagnosis not present

## 2019-10-27 DIAGNOSIS — E1143 Type 2 diabetes mellitus with diabetic autonomic (poly)neuropathy: Secondary | ICD-10-CM | POA: Insufficient documentation

## 2019-10-27 DIAGNOSIS — I120 Hypertensive chronic kidney disease with stage 5 chronic kidney disease or end stage renal disease: Secondary | ICD-10-CM | POA: Diagnosis not present

## 2019-10-27 DIAGNOSIS — A419 Sepsis, unspecified organism: Secondary | ICD-10-CM | POA: Diagnosis not present

## 2019-10-27 DIAGNOSIS — R456 Violent behavior: Secondary | ICD-10-CM | POA: Diagnosis not present

## 2019-10-27 HISTORY — PX: PERIPHERAL VASCULAR THROMBECTOMY: CATH118306

## 2019-10-27 LAB — POTASSIUM (ARMC VASCULAR LAB ONLY): Potassium (ARMC vascular lab): 3.6 (ref 3.5–5.1)

## 2019-10-27 LAB — GLUCOSE, CAPILLARY: Glucose-Capillary: 228 mg/dL — ABNORMAL HIGH (ref 70–99)

## 2019-10-27 SURGERY — PERIPHERAL VASCULAR THROMBECTOMY
Anesthesia: Moderate Sedation | Site: Leg Lower | Laterality: Left

## 2019-10-27 MED ORDER — FENTANYL CITRATE (PF) 100 MCG/2ML IJ SOLN
INTRAMUSCULAR | Status: DC | PRN
  Administered 2019-10-27 (×2): 50 ug via INTRAVENOUS

## 2019-10-27 MED ORDER — HEPARIN SODIUM (PORCINE) 1000 UNIT/ML IJ SOLN
INTRAMUSCULAR | Status: AC
  Filled 2019-10-27: qty 1

## 2019-10-27 MED ORDER — ONDANSETRON HCL 4 MG/2ML IJ SOLN: 4.0000 mg | Freq: Four times a day (QID) | INTRAMUSCULAR | Status: DC | PRN

## 2019-10-27 MED ORDER — DIPHENHYDRAMINE HCL 50 MG/ML IJ SOLN: 50.0000 mg | Freq: Once | INTRAMUSCULAR | Status: AC | PRN

## 2019-10-27 MED ORDER — IODIXANOL 320 MG/ML IV SOLN
INTRAVENOUS | Status: DC | PRN
  Administered 2019-10-27: 60 mL

## 2019-10-27 MED ORDER — HEPARIN SODIUM (PORCINE) 1000 UNIT/ML IJ SOLN
INTRAMUSCULAR | Status: DC | PRN
  Administered 2019-10-27: 4000 [IU] via INTRAVENOUS

## 2019-10-27 MED ORDER — FAMOTIDINE 20 MG PO TABS
ORAL_TABLET | ORAL | Status: AC
  Administered 2019-10-27: 20 mg via ORAL
  Filled 2019-10-27: qty 2

## 2019-10-27 MED ORDER — HYDROMORPHONE HCL 1 MG/ML IJ SOLN: 1.0000 mg | Freq: Once | INTRAMUSCULAR | Status: DC | PRN

## 2019-10-27 MED ORDER — METHYLPREDNISOLONE SODIUM SUCC 125 MG IJ SOLR
INTRAMUSCULAR | Status: AC
  Administered 2019-10-27: 125 mg via INTRAVENOUS
  Filled 2019-10-27: qty 2

## 2019-10-27 MED ORDER — ALTEPLASE 2 MG IJ SOLR
INTRAMUSCULAR | Status: DC | PRN
  Administered 2019-10-27: 4 mg

## 2019-10-27 MED ORDER — MIDAZOLAM HCL 2 MG/2ML IJ SOLN
INTRAMUSCULAR | Status: DC | PRN
  Administered 2019-10-27: 2 mg via INTRAVENOUS
  Administered 2019-10-27: 1 mg via INTRAVENOUS

## 2019-10-27 MED ORDER — SODIUM CHLORIDE 0.9 % IV SOLN: INTRAVENOUS | Status: DC

## 2019-10-27 MED ORDER — FAMOTIDINE 20 MG PO TABS: 40.0000 mg | ORAL_TABLET | Freq: Once | ORAL | Status: AC | PRN

## 2019-10-27 MED ORDER — MIDAZOLAM HCL 2 MG/ML PO SYRP: 8.0000 mg | ORAL_SOLUTION | Freq: Once | ORAL | Status: DC | PRN

## 2019-10-27 MED ORDER — CLINDAMYCIN PHOSPHATE 300 MG/50ML IV SOLN
INTRAVENOUS | Status: AC
  Filled 2019-10-27: qty 50

## 2019-10-27 MED ORDER — MIDAZOLAM HCL 5 MG/5ML IJ SOLN
INTRAMUSCULAR | Status: AC
  Filled 2019-10-27: qty 5

## 2019-10-27 MED ORDER — METHYLPREDNISOLONE SODIUM SUCC 125 MG IJ SOLR: 125.0000 mg | Freq: Once | INTRAMUSCULAR | Status: AC | PRN

## 2019-10-27 MED ORDER — FENTANYL CITRATE (PF) 100 MCG/2ML IJ SOLN
INTRAMUSCULAR | Status: AC
  Filled 2019-10-27: qty 2

## 2019-10-27 MED ORDER — DIPHENHYDRAMINE HCL 50 MG/ML IJ SOLN
INTRAMUSCULAR | Status: AC
  Administered 2019-10-27: 50 mg via INTRAVENOUS
  Filled 2019-10-27: qty 1

## 2019-10-27 SURGICAL SUPPLY — 22 items
BALLN LUTONIX AV 7X60X75 (BALLOONS) ×2
BALLN ULTRV 018 3X100X75 (BALLOONS) ×2
BALLN ULTRV 018 4X60X75 (BALLOONS) ×2
BALLOON LUTONIX AV 7X60X75 (BALLOONS) ×1 IMPLANT
BALLOON ULTRV 018 3X100X75 (BALLOONS) ×1 IMPLANT
BALLOON ULTRV 018 4X60X75 (BALLOONS) ×1 IMPLANT
CANISTER PENUMBRA ENGINE (MISCELLANEOUS) ×2 IMPLANT
CANNULA 5F STIFF (CANNULA) ×2 IMPLANT
CATH BEACON 5 .035 40 KMP TP (CATHETERS) ×1 IMPLANT
CATH BEACON 5 .038 40 KMP TP (CATHETERS) ×1
CATH EMBOLECTOMY 5FR (BALLOONS) ×2 IMPLANT
CATH INDIGO 7D KIT (CATHETERS) ×2 IMPLANT
DEVICE PRESTO INFLATION (MISCELLANEOUS) ×2 IMPLANT
GLIDEWIRE ADV .035X180CM (WIRE) ×2 IMPLANT
PACK ANGIOGRAPHY (CUSTOM PROCEDURE TRAY) ×2 IMPLANT
SHEATH BRITE TIP 6FRX5.5 (SHEATH) ×4 IMPLANT
SHEATH BRITE TIP 7FRX5.5 (SHEATH) ×2 IMPLANT
SUT MNCRL AB 4-0 PS2 18 (SUTURE) ×2 IMPLANT
SYR MEDRAD MARK 7 150ML (SYRINGE) ×2 IMPLANT
TUBING CONTRAST HIGH PRESS 72 (TUBING) ×2 IMPLANT
WIRE G 018X200 V18 (WIRE) ×2 IMPLANT
WIRE MAGIC TOR.035 180C (WIRE) ×4 IMPLANT

## 2019-10-27 NOTE — H&P (Signed)
[facility] Admission History & Physical  MRN : [id number]  Timothy Clark is a 49 y.o. (05/06/1971) male who presents with chief complaint of No chief complaint on file. [name]  History of Present Illness: I am asked to evaluate the patient by the dialysis center. The patient was sent here because they were unable to cannulate the graft recently. Furthermore the Center states there is no thrill or bruit. The patient states this is the first dialysis run to be missed. This problem is acute in onset and has been present for approximately 2 weeks. The patient is unaware of any other change.  Patient denies pain or tenderness overlying the access.  There is no pain with dialysis.  The patient denies hand pain or finger pain consistent with steal syndrome.   There have been past interventions or declots of this access.  The patient is not chronically hypotensive on dialysis.  Current Facility-Administered Medications  Medication Dose Route Frequency Provider Last Rate Last Admin  . fentaNYL (SUBLIMAZE) 100 MCG/2ML injection           . heparin 1000 UNIT/ML injection           . midazolam (VERSED) 5 MG/5ML injection           . 0.9 %  sodium chloride infusion   Intravenous Continuous Kris Hartmann, NP 10 mL/hr at 10/27/19 0801 New Bag at 10/27/19 0801  . clindamycin (CLEOCIN) 300 MG/50ML IVPB           . clindamycin (CLEOCIN) IVPB 300 mg  300 mg Intravenous Once Eulogio Ditch E, NP      . HYDROmorphone (DILAUDID) injection 1 mg  1 mg Intravenous Once PRN Kris Hartmann, NP      . midazolam (VERSED) 2 MG/ML syrup 8 mg  8 mg Oral Once PRN Kris Hartmann, NP      . ondansetron [facility]) injection 4 mg  4 mg Intravenous Q6H PRN Kris Hartmann, NP        Past Medical History:  Diagnosis Date  . Anxiety   . CHF (congestive heart failure) ([place])   . Chronic abdominal pain   . Depression   . Esophagitis   . ESRD on hemodialysis ([place])   . Eye hemorrhage   . Gastritis    . Gastroparesis   . GERD (gastroesophageal reflux disease)   . Glaucoma   . Helicobacter pylori (H. pylori) infection 2016   ERADICATION DOCUMENTED VIA STOOL TEST in AUG 2016 at [place]  . Hiccups   . Hypertension   . Nausea and vomiting    chronic, recurrent  . Neuropathy   . Renal insufficiency   . Type 2 diabetes mellitus with complications (HCC)    diagnosed around age 75    Past Surgical History:  Procedure Laterality Date  . AMPUTATION TOE Right    great toe  . AV FISTULA INSERTION W/ RF MAGNETIC GUIDANCE Left 04/06/2019   Procedure: AV FISTULA INSERTION W/RF MAGNETIC GUIDANCE;  Surgeon: Katha Cabal, MD;  Location: [place] [facility];  Service: Cardiovascular;  Laterality: Left;  . AV FISTULA PLACEMENT Left 05/06/2019   Procedure: INSERTION OF ARTERIOVENOUS (AV) GORE-TEX GRAFT ARM ( FOREARM LOOP );  Surgeon: Katha Cabal, MD;  Location: [facility];  Service: Vascular;  Laterality: Left;  . CATARACT EXTRACTION W/PHACO Left 05/19/2016   Procedure: CATARACT EXTRACTION PHACO AND INTRAOCULAR LENS PLACEMENT LEFT EYE;  Surgeon: Tonny Branch, MD;  [facility];  Service: Ophthalmology;  Laterality: Left;  CDE: 7.30  . CATARACT EXTRACTION W/PHACO Right 06/23/2016   Procedure: CATARACT EXTRACTION PHACO AND INTRAOCULAR LENS PLACEMENT RIGHT EYE CDE=9.87;  Surgeon: Tonny Branch, MD;  [facility];  Service: Ophthalmology;  Laterality: Right;  right  . DIALYSIS/PERMA CATHETER REMOVAL N/A 07/19/2019   Procedure: DIALYSIS/PERMA CATHETER REMOVAL;  Surgeon: Katha Cabal, MD;  Location: [place] [facility];  Service: Cardiovascular;  Laterality: N/A;  . ESOPHAGOGASTRODUODENOSCOPY  2015   Dr. Britta Mccreedy  . ESOPHAGOGASTRODUODENOSCOPY N/A 10/26/2014   RMR: Distal esophagititis-likely reflux related although an element of pill induced injuruy no exclueded  status post biopsy. Diffusely abnormal gastric mucosa of uncertain signigicane -status post gastric biopsy. Focal area of  excoriation in the cardia most consistant with a trauma of heaving.   . ESOPHAGOGASTRODUODENOSCOPY  08/2015   [facility]: mild esophagitis and old gastric contents  . EYE SURGERY  2015   stent placed to left eye  . EYE SURGERY  06/2019   per patient- to remove scar tissue  . INCISION AND DRAINAGE OF WOUND Right 07/16/2017   Procedure: IRRIGATION AND DEBRIDEMENT OF SOFT TISSUE OF ULCERATION RIGHT FOOT;  Surgeon: Caprice Beaver, DPM;  [facility];  Service: Podiatry;  Laterality: Right;  . PERIPHERAL VASCULAR THROMBECTOMY Left 09/27/2019   Procedure: PERIPHERAL VASCULAR THROMBECTOMY;  Surgeon: Katha Cabal, MD;  Location: [place] [facility];  Service: Cardiovascular;  Laterality: Left;     Social History   Tobacco Use  . Smoking status: Never Smoker  . Smokeless tobacco: Never Used  Substance Use Topics  . Alcohol use: No    Alcohol/week: 0.0 standard drinks  . Drug use: No     Family History  Problem Relation Age of Onset  . Ovarian cancer Mother   . Heart attack Father   . Colon polyps Father   . Cervical cancer Sister   . Diabetes Sister   . Colon cancer Neg Hx     No family history of bleeding or clotting disorders, autoimmune disease or porphyria  Allergies  Allergen Reactions  . Donnatal [Phenobarbital-Belladonna Alk] Anaphylaxis and Other (See Comments)    Reaction to GI cocktail  . Fish Allergy Anaphylaxis, Hives and Rash  . Haldol [Haloperidol Lactate] Other (See Comments)    Chest Pain  . Maalox [Calcium Carbonate Antacid] Anaphylaxis and Other (See Comments)    Reaction to GI cocktail  . Shellfish Allergy Hives    Per patient: Patient stated he breaks out in hives when eating shellfish  . Marinol [Dronabinol] Nausea And Vomiting and Other (See Comments)    Caused worsening vomiting.   . Aspirin Hives and Itching  . Bactrim [Sulfamethoxazole-Trimethoprim] Itching  . Keflex [Cephalexin] Hives, Itching and Nausea And Vomiting  . Penicillins Hives,  Itching and Other (See Comments)    Has patient had a PCN reaction causing immediate rash, facial/tongue/throat swelling, SOB or lightheadedness with hypotension: yes Has patient had a PCN reaction causing severe rash involving mucus membranes or skin necrosis: yes Has patient had a PCN reaction that required hospitalization no Has patient had a PCN reaction occurring within the last 10 years: yes If all of the above answers are "NO", then may proceed with Cephalospor     REVIEW OF SYSTEMS (Negative unless checked)  Constitutional: '[]' Weight loss  '[]' Fever  '[]' Chills Cardiac: '[]' Chest pain   '[]' Chest pressure   '[]' Palpitations   '[]' Shortness of breath when laying flat   '[]' Shortness of breath at rest   '[x]' Shortness  of breath with exertion. Vascular:  '[]' Pain in legs with walking   '[]' Pain in legs at rest   '[]' Pain in legs when laying flat   '[]' Claudication   '[]' Pain in feet when walking  '[]' Pain in feet at rest  '[]' Pain in feet when laying flat   '[]' History of DVT   '[]' Phlebitis   '[]' Swelling in legs   '[]' Varicose veins   '[]' Non-healing ulcers Pulmonary:   '[]' Uses home oxygen   '[]' Productive cough   '[]' Hemoptysis   '[]' Wheeze  '[]' COPD   '[]' Asthma Neurologic:  '[]' Dizziness  '[]' Blackouts   '[]' Seizures   '[]' History of stroke   '[]' History of TIA  '[]' Aphasia   '[]' Temporary blindness   '[]' Dysphagia   '[]' Weakness or numbness in arms   '[]' Weakness or numbness in legs Musculoskeletal:  '[x]' Arthritis   '[]' Joint swelling   '[]' Joint pain   '[]' Low back pain Hematologic:  '[]' Easy bruising  '[]' Easy bleeding   '[]' Hypercoagulable state   '[]' Anemic  '[]' Hepatitis Gastrointestinal:  '[]' Blood in stool   '[]' Vomiting blood  '[x]' Gastroesophageal reflux/heartburn   '[]' Difficulty swallowing. Genitourinary:  '[x]' Chronic kidney disease   '[]' Difficult urination  '[]' Frequent urination  '[]' Burning with urination   '[]' Blood in urine Skin:  '[]' Rashes   '[]' Ulcers   '[]' Wounds Psychological:  '[x]' History of anxiety   '[x]'  History of major depression.  Physical Examination  Vitals:    10/27/19 0725  BP: (!) 129/92  Pulse: 80  Resp: 12  Temp: 98.3 F (36.8 C)  TempSrc: Oral  SpO2: 99%  Weight: 131.5 kg  Height: '5\' 11"'  (1.803 m)   Body mass index is 40.45 kg/m. Gen: WD/WN, NAD Head: [place]/AT, No temporalis wasting.  Ear/Nose/Throat: Hearing grossly intact, nares w/o erythema or drainage, oropharynx w/o Erythema/Exudate,  Eyes: Conjunctiva clear, sclera non-icteric Neck: Trachea midline.  No JVD.  Pulmonary:  Good air movement, respirations not labored, no use of accessory muscles.  Cardiac: RRR, normal S1, S2. Vascular: no thrill in left arm AVG Vessel Right Left  Radial Palpable Palpable    Musculoskeletal: M/S 5/5 throughout.  Extremities without ischemic changes.  No deformity or atrophy.  Neurologic: Sensation grossly intact in extremities.  Symmetrical.  Speech is fluent. Motor exam as listed above. Psychiatric: Judgment intact, Mood & affect appropriate for pt's clinical situation. Dermatologic: No rashes or ulcers noted.  No cellulitis or open wounds.    CBC Lab Results  Component Value Date   WBC 5.3 04/27/2019   HGB 8.8 (L) 05/06/2019   HCT 26.0 (L) 05/06/2019   MCV 95.2 04/27/2019   PLT 123 (L) 04/27/2019    BMET    Component Value Date/Time   NA 138 05/06/2019 1020   K 4.1 05/06/2019 1020   CL 99 05/06/2019 1020   CO2 25 04/27/2019 1110   GLUCOSE 226 (H) 05/06/2019 1020   BUN 37 (H) 05/06/2019 1020   CREATININE 6.50 (H) 05/06/2019 1020   CREATININE 1.20 06/22/2017 1436   CALCIUM 8.7 (L) 04/27/2019 1110   GFRNONAA 11 (L) 04/27/2019 1110   GFRAA 12 (L) 04/27/2019 1110   CrCl cannot be calculated (Patient's most recent lab result is older than the maximum 21 days allowed.).  COAG Lab Results  Component Value Date   INR 1.1 04/27/2019   INR 1.2 03/29/2019   INR 1.1 10/25/2018    Radiology PERIPHERAL VASCULAR CATHETERIZATION  Result Date: 09/27/2019 See op note   Assessment/Plan 1.  Complication dialysis device with  thrombosis AV access:  Patient's dialysis access is thrombosed. The patient will undergo thrombectomy using interventional techniques.  The risks and benefits were described to the patient.  All questions were answered.  The patient agrees to proceed with angiography and intervention. Potassium will be drawn to ensure that it is an appropriate level prior to performing thrombectomy. 2.  End-stage renal disease requiring hemodialysis:  Patient will continue dialysis therapy without further interruption if a successful thrombectomy is not achieved then catheter will be placed. Dialysis has already been arranged since the patient missed their previous session 3.  Hypertension:  Patient will continue medical management; nephrology is following no changes in oral medications. 4. Diabetes mellitus:  Glucose will be monitored and oral medications been held this morning once the patient has undergone the patient's procedure po intake will be reinitiated and again Accu-Cheks will be used to assess the blood glucose level and treat as needed. The patient will be restarted on the patient's usual hypoglycemic regime     Leotis Pain, MD  10/27/2019 8:12 AM

## 2019-10-27 NOTE — Discharge Instructions (Signed)

## 2019-10-27 NOTE — Op Note (Signed)
[facility]    OPERATIVE NOTE   PROCEDURE: 1.  Left forearm loop arteriovenous graft cannulation under ultrasound guidance in both a retrograde and then antegrade fashion crossing 2.  Left arm shuntogram and central venogram 3.  Catheter directed thrombolysis with 4 mg of TPA  4.  Mechanical thrombectomy to the left forearm loop AV graft as well as the left brachial artery and ulnar artery with the penumbra CAT 7D 5.  Fogarty embolectomy for residual arterial plug 6.  Percutaneous transluminal angioplasty of brachial and ulnar artery with 4 mm diameter angioplasty balloon and of ulnar artery more distally with 3 mm diameter angioplasty balloon 7.  Percutaneous transluminal angioplasty of the mid and distal graft with 7 mm diameter by 6 cm likely tonics drug-coated angioplasty balloon 8.  Percutaneous transluminal angioplasty of the mid upper arm brachial vein just proximal to the previously placed stents with 7 mm diameter angioplasty balloon  PRE-OPERATIVE DIAGNOSIS: 1. ESRD 2.  Thrombosed loop forearm AV graft arteriovenous graft  POST-OPERATIVE DIAGNOSIS: same as above   SURGEON: Timothy Pain, MD  ANESTHESIA: local with Moderate Conscious Sedation for approximately 60 minutes using 3 mg of Versed and 100 mcg of Fentanyl  ESTIMATED BLOOD LOSS: 500 cc  SPECIMEN(S):  None  CONTRAST: 60 cc  FLUORO TIME:  11.6 minutes  INDICATIONS: Patient is a 49 y.o.male who presents with a thrombosed left loop forearm arteriovenous graft.  The patient is scheduled for an attempted declot and shuntogram.  The patient is aware the risks include but are not limited to: bleeding, infection, thrombosis of the cannulated access, and possible anaphylactic reaction to the contrast.  The patient is aware of the risks of the procedure and elects to proceed forward.  DESCRIPTION: After full informed written consent was obtained, the patient was brought back to the angiography suite and  placed supine upon the angiography table.  The patient was connected to monitoring equipment. Moderate conscious sedation was administered with a face to face encounter with the patient throughout the procedure with my supervision of the RN administering medicines and monitoring the patient's vital signs, pulse oximetry, telemetry and mental status throughout from the start of the procedure until the patient was taken to the recovery room. The left arm was prepped and draped in the standard fashion for a percutaneous access intervention.  Under ultrasound guidance, the left loop forearm arteriovenous graft was cannulated with a micropuncture needle under direct ultrasound guidance due to the pulseless nature of the graft in both an antegrade and a retrograde fashion crossing, and permanent images were performed.  The microwire was advanced and the needle was exchanged for the a microsheath.  I then upsized to a 6 Fr Sheath and imaging was performed.  Hand injections were completed to image the access including the central venous system. This demonstrated no flow within the AV graft.  Based on the images, this patient will need extensive treatment to salvage the graft. I then gave the patient 4000 units of intravenous heparin.  I then placed a Magic torque wire into the brachial artery from the retrograde sheath and into the brachial vein from the antegrade sheath. 4 mg of TPA were deployed throughout the graft. This was allowed to dwell. Mechanical thrombectomy was then performed throughout the graft and into the brachial vein.  This was done with the penumbra CAT 7D device after upsizing to the 7 French sheath.  This uncovered hyperplastic stenosis and thrombus at the proximal edge of  the previously placed stents in the brachial vein, moderate stenosis in the mid to distal graft.  A residual arterial plug was also seen at the arterial anastomosis as well as thrombus in the brachial artery. An attempt to clear the  arterial plug was done with passes of the Fogarty embolectomy balloon.  Following this, there was thrombus within the brachial artery and the radial and ulnar arteries.  Using an advantage wire was able to flip down into the distal brachial artery and then into the ulnar artery and imaging was performed showing the thrombus.  I then used several techniques including the penumbra CAT 7D device to perform mechanical thrombectomy as well as the Fogarty embolectomy in the ulnar and brachial artery.  There remained thrombus in the distal brachial artery and the ulnar artery following this, but there was not flow in the radial artery.  I then exchanged for a 0.018 wire and performed angioplasty of the ulnar and brachial arteries.  For the distal brachial artery and proximal ulnar artery a 4 mm diameter by 10 cm length angioplasty balloon was inflated to 8 atm for 1 minute.  For the ulnar artery in the midsegment a 3 mm diameter by 10 cm length angioplasty balloon was inflated to 12 atm for 1 minute.  Completion imaging following this showed some residual thrombus and now some spasm in the ulnar artery distally.  The radial artery now had flow in the hand was perfused.  The arterial anastomosis and the proximal portion of the graft were cleared of thrombus and there was brisk flow in the graft although it became sluggish at the moderate stenosis in the mid to distal segment as well as the residual lesion in the brachial vein proximal to the previously placed stents.  The retrograde sheath was removed. I then turned my attention to the thrombus in the distal graft and the brachial vein. Mechanical thrombectomy was performed with the penumbra CAT 7D device after upsizing to a 7 [place] sheath. This resulted in improvement but there remained residual stenosis in the mid to distal graft and in the brachial vein just proximal to the previously placed stents.  I then elected to treat this with angioplasty.  A 7 mm diameter by 6  cm length Lutonix drug-coated angioplasty balloon was inflated to 12 atm in the mid to distal portion of the graft.  Stent advanced to the brachial vein at and just proximal to previously placed stents inflated to 10 atm.  Completion imaging showed less than 10% residual stenosis in both locations after angioplasty.  There was some spasm in the brachial vein proximal to the treated area that was not present before angioplasty.  There is now brisk flow in the graft that was easily palpable.    Based on the completion imaging, no further intervention is necessary.  The wire and balloon were removed from the sheath.  A 4-0 Monocryl purse-string suture was sewn around the sheath.  The sheath was removed while tying down the suture.  A sterile bandage was applied to the puncture site.  COMPLICATIONS: None  CONDITION: Stable   Timothy Clark 10/27/2019 9:32 AM   This note was created with Dragon Medical transcription system. Any errors in dictation are purely unintentional.

## 2019-10-28 DIAGNOSIS — E1165 Type 2 diabetes mellitus with hyperglycemia: Secondary | ICD-10-CM | POA: Insufficient documentation

## 2019-11-02 DIAGNOSIS — Z992 Dependence on renal dialysis: Secondary | ICD-10-CM | POA: Diagnosis not present

## 2019-11-02 DIAGNOSIS — N186 End stage renal disease: Secondary | ICD-10-CM | POA: Diagnosis not present

## 2019-11-02 DIAGNOSIS — Z23 Encounter for immunization: Secondary | ICD-10-CM | POA: Diagnosis not present

## 2019-11-03 DIAGNOSIS — I10 Essential (primary) hypertension: Secondary | ICD-10-CM | POA: Diagnosis not present

## 2019-11-03 DIAGNOSIS — F419 Anxiety disorder, unspecified: Secondary | ICD-10-CM | POA: Diagnosis not present

## 2019-11-03 DIAGNOSIS — E1122 Type 2 diabetes mellitus with diabetic chronic kidney disease: Secondary | ICD-10-CM | POA: Diagnosis not present

## 2019-11-03 DIAGNOSIS — E782 Mixed hyperlipidemia: Secondary | ICD-10-CM | POA: Diagnosis not present

## 2019-11-03 DIAGNOSIS — N183 Chronic kidney disease, stage 3 unspecified: Secondary | ICD-10-CM | POA: Diagnosis not present

## 2019-11-04 DIAGNOSIS — Z992 Dependence on renal dialysis: Secondary | ICD-10-CM | POA: Diagnosis not present

## 2019-11-04 DIAGNOSIS — N186 End stage renal disease: Secondary | ICD-10-CM | POA: Diagnosis not present

## 2019-11-04 DIAGNOSIS — Z23 Encounter for immunization: Secondary | ICD-10-CM | POA: Diagnosis not present

## 2019-11-07 DIAGNOSIS — Z992 Dependence on renal dialysis: Secondary | ICD-10-CM | POA: Diagnosis not present

## 2019-11-07 DIAGNOSIS — Z23 Encounter for immunization: Secondary | ICD-10-CM | POA: Diagnosis not present

## 2019-11-07 DIAGNOSIS — N186 End stage renal disease: Secondary | ICD-10-CM | POA: Diagnosis not present

## 2019-11-09 DIAGNOSIS — Z23 Encounter for immunization: Secondary | ICD-10-CM | POA: Diagnosis not present

## 2019-11-09 DIAGNOSIS — Z992 Dependence on renal dialysis: Secondary | ICD-10-CM | POA: Diagnosis not present

## 2019-11-09 DIAGNOSIS — N186 End stage renal disease: Secondary | ICD-10-CM | POA: Diagnosis not present

## 2019-11-11 DIAGNOSIS — N186 End stage renal disease: Secondary | ICD-10-CM | POA: Diagnosis not present

## 2019-11-11 DIAGNOSIS — Z992 Dependence on renal dialysis: Secondary | ICD-10-CM | POA: Diagnosis not present

## 2019-11-14 DIAGNOSIS — N186 End stage renal disease: Secondary | ICD-10-CM | POA: Diagnosis not present

## 2019-11-14 DIAGNOSIS — Z992 Dependence on renal dialysis: Secondary | ICD-10-CM | POA: Diagnosis not present

## 2019-11-14 NOTE — Progress Notes (Deleted)
Referring Provider: [name], MD Primary Care Physician:  [name], MD Primary GI Physician: Dr. Gala Romney  No chief complaint on file.   HPI:   Timothy Clark is a 49 y.o. male presenting today for follow-up of gastroparesis, GERD. History of chronic abdominal pain, GERD, esophagitis, gastritis, suspected gastroparesis, recurrent nausea and vomiting, H. pylori s/p eradication in 2016, T2DM, CKD, and diastolic dysfunction.  GES on 06/15/2015 with rapid gastric transit.  [facility] question dumping syndrome, but EGD at [facility] January 2017 with mild esophagitis and old gastric contents.  Felt clinically consistent with gastroparesis.  RUQ ultrasound March 2019 normal.  CT November 2019 with fatty liver, gallbladder sludge.  HIDA scan normal in June 2020.  No prior colonoscopy.   Patient was last seen in our office on 07/14/2019 for follow-up.  He was doing well at that time.  Recently started dialysis on Monday, Wednesday, and Friday and he had significant improvement of symptoms after starting dialysis.  Denied abdominal pain, nausea, or vomiting.  GERD symptoms were well controlled.  Bowels moving daily without alarm symptoms.  Requested holding off on colonoscopy.  Plans to try decreasing Reglan to 5 mg daily before breakfast, reemphasized gastroparesis diet, continue Protonix 40 mg twice daily, follow-up in 4-6 months.  He was to call if he had any return of nausea, vomiting, or abdominal pain with lowering the dose of Reglan.  Hospital admission in December with COVID-19 and hypertensive urgency. Hospitalized again in January 2021 with epilepsy. Unable to view documentation related to these encounters due to claims encounter. Hospitalized 10/27/19-11/01/19 for DKA secondary to dietary indiscretions.  A1C 9.9. At discharge he was to continue home medications.   Today:    Past Medical History:  Diagnosis Date  . Anxiety   . CHF (congestive heart failure) ([place])   . Chronic abdominal pain     . Depression   . Esophagitis   . ESRD on hemodialysis ([place])   . Eye hemorrhage   . Gastritis   . Gastroparesis   . GERD (gastroesophageal reflux disease)   . Glaucoma   . Helicobacter pylori (H. pylori) infection 2016   ERADICATION DOCUMENTED VIA STOOL TEST in AUG 2016 at [place]  . Hiccups   . Hypertension   . Nausea and vomiting    chronic, recurrent  . Neuropathy   . Renal insufficiency   . Type 2 diabetes mellitus with complications ([facility])    diagnosed around age 86    Past Surgical History:  Procedure Laterality Date  . AMPUTATION TOE Right    great toe  . AV FISTULA INSERTION W/ RF MAGNETIC GUIDANCE Left 04/06/2019   Procedure: AV FISTULA INSERTION W/RF MAGNETIC GUIDANCE;  Surgeon: Katha Cabal, MD;  Location: [facility];  Service: Cardiovascular;  Laterality: Left;  . AV FISTULA PLACEMENT Left 05/06/2019   Procedure: INSERTION OF ARTERIOVENOUS (AV) GORE-TEX GRAFT ARM ( FOREARM LOOP );  Surgeon: Katha Cabal, MD;  Location: [facility];  Service: Vascular;  Laterality: Left;  . CATARACT EXTRACTION W/PHACO Left 05/19/2016   Procedure: CATARACT EXTRACTION PHACO AND INTRAOCULAR LENS PLACEMENT LEFT EYE;  Surgeon: Tonny Branch, MD;  Location: [facility];  Service: Ophthalmology;  Laterality: Left;  CDE: 7.30  . CATARACT EXTRACTION W/PHACO Right 06/23/2016   Procedure: CATARACT EXTRACTION PHACO AND INTRAOCULAR LENS PLACEMENT RIGHT EYE CDE=9.87;  Surgeon: Tonny Branch, MD;  Location: [facility];  Service: Ophthalmology;  Laterality: Right;  right  . DIALYSIS/PERMA CATHETER REMOVAL N/A  07/19/2019   Procedure: DIALYSIS/PERMA CATHETER REMOVAL;  Surgeon: Katha Cabal, MD;  Location: [facility];  Service: Cardiovascular;  Laterality: N/A;  . ESOPHAGOGASTRODUODENOSCOPY  2015   Dr. Britta Mccreedy  . ESOPHAGOGASTRODUODENOSCOPY N/A 10/26/2014   RMR: Distal esophagititis-likely reflux related although an element of pill induced injuruy no exclueded  status post biopsy. Diffusely  abnormal gastric mucosa of uncertain signigicane -status post gastric biopsy. Focal area of excoriation in the cardia most consistant with a trauma of heaving.   . ESOPHAGOGASTRODUODENOSCOPY  08/2015   [facility]: mild esophagitis and old gastric contents  . EYE SURGERY  2015   stent placed to left eye  . EYE SURGERY  06/2019   per patient- to remove scar tissue  . INCISION AND DRAINAGE OF WOUND Right 07/16/2017   Procedure: IRRIGATION AND DEBRIDEMENT OF SOFT TISSUE OF ULCERATION RIGHT FOOT;  Surgeon: Caprice Beaver, DPM;  Location: [facility];  Service: Podiatry;  Laterality: Right;  . PERIPHERAL VASCULAR THROMBECTOMY Left 09/27/2019   Procedure: PERIPHERAL VASCULAR THROMBECTOMY;  Surgeon: Katha Cabal, MD;  Location: [facility];  Service: Cardiovascular;  Laterality: Left;  . PERIPHERAL VASCULAR THROMBECTOMY Left 10/27/2019   Procedure: PERIPHERAL VASCULAR THROMBECTOMY;  Surgeon: Algernon Huxley, MD;  Location: [facility];  Service: Cardiovascular;  Laterality: Left;    Current Outpatient Medications  Medication Sig Dispense Refill  . amLODipine (NORVASC) 5 MG tablet Take 5 mg by mouth daily.    [name] atorvastatin (LIPITOR) 10 MG tablet Take 10 mg by mouth at bedtime.     . brimonidine (ALPHAGAN) 0.15 % ophthalmic solution Place 1 drop into both eyes 3 (three) times daily.     . carvedilol (COREG) 25 MG tablet Take 25 mg by mouth 2 (two) times daily with a meal.    . cloNIDine (CATAPRES) 0.2 MG tablet Take 0.2 mg by mouth 3 (three) times daily.     . cyanocobalamin (,VITAMIN B-12,) 1000 MCG/ML injection Inject 1,000 mcg into the muscle every 30 (thirty) days.   1  . folic acid (FOLVITE) 1 MG tablet Take 1 mg by mouth daily.    [name] losartan (COZAAR) 50 MG tablet Take 50 mg by mouth at bedtime.     . metoCLOPramide (REGLAN) 5 MG tablet Take 1 tablet (5 mg total) by mouth daily before breakfast. 30 tablet 5  . NOVOLOG MIX 70/30 FLEXPEN (70-30) 100 UNIT/ML FlexPen 25-28 Units by  Subconjunctival route See admin instructions. Inject 28 units subcutaneously in the morning & inject 25 units subcutaneously in the evening.    . pantoprazole (PROTONIX) 40 MG tablet Take 1 tablet (40 mg total) by mouth 2 (two) times daily before a meal. 180 tablet 3  . potassium chloride SA (K-DUR) 20 MEQ tablet Take 20 mEq by mouth daily at 12 noon.     . promethazine (PHENERGAN) 25 MG tablet Take 12.5 mg by mouth every 6 (six) hours as needed for nausea or vomiting.     . sitaGLIPtin (JANUVIA) 25 MG tablet Take 25 mg by mouth daily.     . sucralfate (CARAFATE) 1 g tablet Take 1 g by mouth 4 (four) times daily.     . timolol (TIMOPTIC) 0.5 % ophthalmic solution Place 1 drop into both eyes 2 (two) times daily.    [name] torsemide (DEMADEX) 20 MG tablet Take 40 mg by mouth daily at 12 noon.      No current facility-administered medications for this visit.    Allergies as  of 11/16/2019 - Review Complete 10/27/2019  Allergen Reaction Noted  . Donnatal [phenobarbital-belladonna alk] Anaphylaxis and Other (See Comments) 07/12/2015  . Fish allergy Anaphylaxis, Hives, and Rash 01/14/2015  . Haldol [haloperidol lactate] Other (See Comments) 08/07/2015  . Maalox [calcium carbonate antacid] Anaphylaxis and Other (See Comments) 07/12/2015  . Shellfish allergy Hives 09/27/2019  . Marinol [dronabinol] Nausea And Vomiting and Other (See Comments) 05/28/2018  . Aspirin Hives and Itching 03/22/2009  . Bactrim [sulfamethoxazole-trimethoprim] Itching 12/06/2014  . Keflex [cephalexin] Hives, Itching, and Nausea And Vomiting 06/22/2017  . Penicillins Hives, Itching, and Other (See Comments) 03/22/2009    Family History  Problem Relation Age of Onset  . Ovarian cancer Mother   . Heart attack Father   . Colon polyps Father   . Cervical cancer Sister   . Diabetes Sister   . Colon cancer Neg Hx     Social History   Socioeconomic History  . Marital status: Single    Spouse name: Not on file  . Number of  children: Not on file  . Years of education: Not on file  . Highest education level: Not on file  Occupational History  . Not on file  Tobacco Use  . Smoking status: Never Smoker  . Smokeless tobacco: Never Used  Substance and Sexual Activity  . Alcohol use: No    Alcohol/week: 0.0 standard drinks  . Drug use: No  . Sexual activity: Yes  Other Topics Concern  . Not on file  Social History Narrative   Lives with dad and sister   Social Determinants of Health   Financial Resource Strain:   . Difficulty of Paying Living Expenses:   Food Insecurity:   . Worried About Charity fundraiser in the Last Year:   . Arboriculturist in the Last Year:   Transportation Needs:   . Film/video editor (Medical):   [name] Lack of Transportation (Non-Medical):   Physical Activity:   . Days of Exercise per Week:   . Minutes of Exercise per Session:   Stress:   . Feeling of Stress :   Social Connections:   . Frequency of Communication with Friends and Family:   . Frequency of Social Gatherings with Friends and Family:   . Attends Religious Services:   . Active Member of Clubs or Organizations:   . Attends Archivist Meetings:   [name] Marital Status:     Review of Systems: Gen: Denies fever, chills, anorexia. Denies fatigue, weakness, weight loss.  CV: Denies chest pain, palpitations, syncope, peripheral edema, and claudication. Resp: Denies dyspnea at rest, cough, wheezing, coughing up blood, and pleurisy. GI: Denies vomiting blood, jaundice, and fecal incontinence.   Denies dysphagia or odynophagia. Derm: Denies rash, itching, dry skin Psych: Denies depression, anxiety, memory loss, confusion. No homicidal or suicidal ideation.  Heme: Denies bruising, bleeding, and enlarged lymph nodes.  Physical Exam: There were no vitals taken for this visit. General:   Alert and oriented. No distress noted. Pleasant and cooperative.  Head:  Normocephalic and atraumatic. Eyes:  Conjuctiva  clear without scleral icterus. Mouth:  Oral mucosa pink and moist. Good dentition. No lesions. Heart:  S1, S2 present without murmurs appreciated. Lungs:  Clear to auscultation bilaterally. No wheezes, rales, or rhonchi. No distress.  Abdomen:  +BS, soft, non-tender and non-distended. No rebound or guarding. No HSM or masses noted. Msk:  Symmetrical without gross deformities. Normal posture. Extremities:  Without edema. Neurologic:  Alert and  oriented  x4 Psych:  Alert and cooperative. Normal mood and affect.

## 2019-11-15 DIAGNOSIS — E782 Mixed hyperlipidemia: Secondary | ICD-10-CM | POA: Diagnosis not present

## 2019-11-15 DIAGNOSIS — I10 Essential (primary) hypertension: Secondary | ICD-10-CM | POA: Diagnosis not present

## 2019-11-15 DIAGNOSIS — I129 Hypertensive chronic kidney disease with stage 1 through stage 4 chronic kidney disease, or unspecified chronic kidney disease: Secondary | ICD-10-CM | POA: Diagnosis not present

## 2019-11-15 DIAGNOSIS — N183 Chronic kidney disease, stage 3 unspecified: Secondary | ICD-10-CM | POA: Diagnosis not present

## 2019-11-15 DIAGNOSIS — E1122 Type 2 diabetes mellitus with diabetic chronic kidney disease: Secondary | ICD-10-CM | POA: Diagnosis not present

## 2019-11-16 ENCOUNTER — Ambulatory Visit: Payer: Medicare HMO | Admitting: Gastroenterology

## 2019-11-16 DIAGNOSIS — Z992 Dependence on renal dialysis: Secondary | ICD-10-CM | POA: Diagnosis not present

## 2019-11-16 DIAGNOSIS — N186 End stage renal disease: Secondary | ICD-10-CM | POA: Diagnosis not present

## 2019-11-18 DIAGNOSIS — Z992 Dependence on renal dialysis: Secondary | ICD-10-CM | POA: Diagnosis not present

## 2019-11-18 DIAGNOSIS — N186 End stage renal disease: Secondary | ICD-10-CM | POA: Diagnosis not present

## 2019-11-21 DIAGNOSIS — Z992 Dependence on renal dialysis: Secondary | ICD-10-CM | POA: Diagnosis not present

## 2019-11-21 DIAGNOSIS — N186 End stage renal disease: Secondary | ICD-10-CM | POA: Diagnosis not present

## 2019-11-23 DIAGNOSIS — N186 End stage renal disease: Secondary | ICD-10-CM | POA: Diagnosis not present

## 2019-11-23 DIAGNOSIS — Z992 Dependence on renal dialysis: Secondary | ICD-10-CM | POA: Diagnosis not present

## 2019-11-24 DIAGNOSIS — E1143 Type 2 diabetes mellitus with diabetic autonomic (poly)neuropathy: Secondary | ICD-10-CM | POA: Diagnosis not present

## 2019-11-24 DIAGNOSIS — D509 Iron deficiency anemia, unspecified: Secondary | ICD-10-CM | POA: Diagnosis not present

## 2019-11-24 DIAGNOSIS — D519 Vitamin B12 deficiency anemia, unspecified: Secondary | ICD-10-CM | POA: Diagnosis not present

## 2019-11-24 DIAGNOSIS — E11319 Type 2 diabetes mellitus with unspecified diabetic retinopathy without macular edema: Secondary | ICD-10-CM | POA: Diagnosis not present

## 2019-11-24 DIAGNOSIS — E1151 Type 2 diabetes mellitus with diabetic peripheral angiopathy without gangrene: Secondary | ICD-10-CM | POA: Diagnosis not present

## 2019-11-24 DIAGNOSIS — D631 Anemia in chronic kidney disease: Secondary | ICD-10-CM | POA: Diagnosis not present

## 2019-11-24 DIAGNOSIS — Z0001 Encounter for general adult medical examination with abnormal findings: Secondary | ICD-10-CM | POA: Diagnosis not present

## 2019-11-24 DIAGNOSIS — E1122 Type 2 diabetes mellitus with diabetic chronic kidney disease: Secondary | ICD-10-CM | POA: Diagnosis not present

## 2019-11-25 DIAGNOSIS — N186 End stage renal disease: Secondary | ICD-10-CM | POA: Diagnosis not present

## 2019-11-25 DIAGNOSIS — Z992 Dependence on renal dialysis: Secondary | ICD-10-CM | POA: Diagnosis not present

## 2019-11-28 DIAGNOSIS — Z992 Dependence on renal dialysis: Secondary | ICD-10-CM | POA: Diagnosis not present

## 2019-11-28 DIAGNOSIS — N186 End stage renal disease: Secondary | ICD-10-CM | POA: Diagnosis not present

## 2019-11-29 ENCOUNTER — Other Ambulatory Visit: Payer: Self-pay

## 2019-11-29 ENCOUNTER — Ambulatory Visit (INDEPENDENT_AMBULATORY_CARE_PROVIDER_SITE_OTHER): Payer: Medicare HMO | Admitting: Nurse Practitioner

## 2019-11-29 ENCOUNTER — Ambulatory Visit (INDEPENDENT_AMBULATORY_CARE_PROVIDER_SITE_OTHER): Payer: Medicare HMO

## 2019-11-29 ENCOUNTER — Encounter (INDEPENDENT_AMBULATORY_CARE_PROVIDER_SITE_OTHER): Payer: Self-pay | Admitting: Nurse Practitioner

## 2019-11-29 VITALS — BP 117/78 | HR 70 | Resp 16 | Ht 72.0 in | Wt 277.0 lb

## 2019-11-29 DIAGNOSIS — N186 End stage renal disease: Secondary | ICD-10-CM | POA: Diagnosis not present

## 2019-11-29 DIAGNOSIS — Z992 Dependence on renal dialysis: Secondary | ICD-10-CM | POA: Diagnosis not present

## 2019-11-29 DIAGNOSIS — Z95820 Peripheral vascular angioplasty status with implants and grafts: Secondary | ICD-10-CM

## 2019-11-29 DIAGNOSIS — E118 Type 2 diabetes mellitus with unspecified complications: Secondary | ICD-10-CM | POA: Diagnosis not present

## 2019-11-29 DIAGNOSIS — E1165 Type 2 diabetes mellitus with hyperglycemia: Secondary | ICD-10-CM

## 2019-11-29 DIAGNOSIS — I10 Essential (primary) hypertension: Secondary | ICD-10-CM | POA: Diagnosis not present

## 2019-11-29 DIAGNOSIS — IMO0002 Reserved for concepts with insufficient information to code with codable children: Secondary | ICD-10-CM

## 2019-11-29 NOTE — Progress Notes (Signed)
Subjective:    Patient ID: Timothy Clark, male    DOB: Jan 02, 1971, 49 y.o.   MRN: [id number] Chief Complaint  Patient presents with  . Follow-up    ultrasound follow up     The patient returns to the office for followup status post intervention of the dialysis access left forearm loop graft. Following the intervention the patient states that he had a functional access for approximately 2 weeks.  It is also notable that after the patient was discharged from his recent thrombectomy he was traveling with a friend and had to be admitted that same day for diabetic ketoacidosis with a blood sugar of 527.  The patient denies an increase in arm swelling. At the present time the patient denies hand pain.  The patient denies amaurosis fugax or recent TIA symptoms. There are no recent neurological changes noted. The patient denies claudication symptoms or rest pain symptoms. The patient denies history of DVT, PE or superficial thrombophlebitis. The patient denies recent episodes of angina or shortness of breath.   Today the patient underwent a HDA of his forearm loop graft and show that his loop graft is completely occluded.   Review of Systems  Gastrointestinal: Positive for nausea.  All other systems reviewed and are negative.      Objective:   Physical Exam Vitals reviewed.  Constitutional:      Appearance: Normal appearance.  HENT:     Head: Normocephalic.  Cardiovascular:     Rate and Rhythm: Normal rate.     Pulses: Normal pulses.          Radial pulses are 2+ on the left side.     Arteriovenous access: left arteriovenous access is present.    Comments: No thrill or bruit Neurological:     Mental Status: He is alert and oriented to person, place, and time.  Psychiatric:        Mood and Affect: Mood normal.        Behavior: Behavior normal.        Thought Content: Thought content normal.        Judgment: Judgment normal.     BP 117/78 (BP Location: Right Arm)   Pulse  70   Resp 16   Ht 6' (1.829 m)   Wt 277 lb (125.6 kg)   BMI 37.57 kg/m   Past Medical History:  Diagnosis Date  . Anxiety   . CHF (congestive heart failure) ([place])   . Chronic abdominal pain   . Depression   . Esophagitis   . ESRD on hemodialysis ([place])   . Eye hemorrhage   . Gastritis   . Gastroparesis   . GERD (gastroesophageal reflux disease)   . Glaucoma   . Helicobacter pylori (H. pylori) infection 2016   ERADICATION DOCUMENTED VIA STOOL TEST in AUG 2016 at [place]  . Hiccups   . Hypertension   . Nausea and vomiting    chronic, recurrent  . Neuropathy   . Renal insufficiency   . Type 2 diabetes mellitus with complications (HCC)    diagnosed around age 6    Social History   Socioeconomic History  . Marital status: Single    Spouse name: Not on file  . Number of children: Not on file  . Years of education: Not on file  . Highest education level: Not on file  Occupational History  . Not on file  Tobacco Use  . Smoking status: Never Smoker  . Smokeless tobacco: Never Used  Substance and Sexual Activity  . Alcohol use: No    Alcohol/week: 0.0 standard drinks  . Drug use: No  . Sexual activity: Yes  Other Topics Concern  . Not on file  Social History Narrative   Lives with dad and sister   Social Determinants of Health   Financial Resource Strain:   . Difficulty of Paying Living Expenses:   Food Insecurity:   . Worried About Charity fundraiser in the Last Year:   . Arboriculturist in the Last Year:   Transportation Needs:   . Film/video editor (Medical):   [name] Lack of Transportation (Non-Medical):   Physical Activity:   . Days of Exercise per Week:   . Minutes of Exercise per Session:   Stress:   . Feeling of Stress :   Social Connections:   . Frequency of Communication with Friends and Family:   . Frequency of Social Gatherings with Friends and Family:   . Attends Religious Services:   . Active Member of Clubs or Organizations:   . Attends  Archivist Meetings:   [name] Marital Status:   Intimate Partner Violence:   . Fear of Current or Ex-Partner:   . Emotionally Abused:   [name] Physically Abused:   . Sexually Abused:     Past Surgical History:  Procedure Laterality Date  . AMPUTATION TOE Right    great toe  . AV FISTULA INSERTION W/ RF MAGNETIC GUIDANCE Left 04/06/2019   Procedure: AV FISTULA INSERTION W/RF MAGNETIC GUIDANCE;  Surgeon: Katha Cabal, MD;  Location: [facility];  Service: Cardiovascular;  Laterality: Left;  . AV FISTULA PLACEMENT Left 05/06/2019   Procedure: INSERTION OF ARTERIOVENOUS (AV) GORE-TEX GRAFT ARM ( FOREARM LOOP );  Surgeon: Katha Cabal, MD;  Location: [facility];  Service: Vascular;  Laterality: Left;  . CATARACT EXTRACTION W/PHACO Left 05/19/2016   Procedure: CATARACT EXTRACTION PHACO AND INTRAOCULAR LENS PLACEMENT LEFT EYE;  Surgeon: Tonny Branch, MD;  Location: [facility];  Service: Ophthalmology;  Laterality: Left;  CDE: 7.30  . CATARACT EXTRACTION W/PHACO Right 06/23/2016   Procedure: CATARACT EXTRACTION PHACO AND INTRAOCULAR LENS PLACEMENT RIGHT EYE CDE=9.87;  Surgeon: Tonny Branch, MD;  Location: [facility];  Service: Ophthalmology;  Laterality: Right;  right  . DIALYSIS/PERMA CATHETER REMOVAL N/A 07/19/2019   Procedure: DIALYSIS/PERMA CATHETER REMOVAL;  Surgeon: Katha Cabal, MD;  Location: [facility];  Service: Cardiovascular;  Laterality: N/A;  . ESOPHAGOGASTRODUODENOSCOPY  2015   Dr. Britta Mccreedy  . ESOPHAGOGASTRODUODENOSCOPY N/A 10/26/2014   RMR: Distal esophagititis-likely reflux related although an element of pill induced injuruy no exclueded  status post biopsy. Diffusely abnormal gastric mucosa of uncertain signigicane -status post gastric biopsy. Focal area of excoriation in the cardia most consistant with a trauma of heaving.   . ESOPHAGOGASTRODUODENOSCOPY  08/2015   [facility]: mild esophagitis and old gastric contents  . EYE SURGERY  2015   stent placed to left  eye  . EYE SURGERY  06/2019   per patient- to remove scar tissue  . INCISION AND DRAINAGE OF WOUND Right 07/16/2017   Procedure: IRRIGATION AND DEBRIDEMENT OF SOFT TISSUE OF ULCERATION RIGHT FOOT;  Surgeon: Caprice Beaver, DPM;  Location: [facility];  Service: Podiatry;  Laterality: Right;  . PERIPHERAL VASCULAR THROMBECTOMY Left 09/27/2019   Procedure: PERIPHERAL VASCULAR THROMBECTOMY;  Surgeon: Katha Cabal, MD;  Location: [facility];  Service: Cardiovascular;  Laterality: Left;  . PERIPHERAL VASCULAR THROMBECTOMY Left  10/27/2019   Procedure: PERIPHERAL VASCULAR THROMBECTOMY;  Surgeon: Algernon Huxley, MD;  Location: [facility];  Service: Cardiovascular;  Laterality: Left;    Family History  Problem Relation Age of Onset  . Ovarian cancer Mother   . Heart attack Father   . Colon polyps Father   . Cervical cancer Sister   . Diabetes Sister   . Colon cancer Neg Hx     Allergies  Allergen Reactions  . Donnatal [Phenobarbital-Belladonna Alk] Anaphylaxis and Other (See Comments)    Reaction to GI cocktail  . Fish Allergy Anaphylaxis, Hives and Rash  . Haldol [Haloperidol Lactate] Other (See Comments)    Chest Pain  . Maalox [Calcium Carbonate Antacid] Anaphylaxis and Other (See Comments)    Reaction to GI cocktail  . Shellfish Allergy Hives    Per patient: Patient stated he breaks out in hives when eating shellfish  . Marinol [Dronabinol] Nausea And Vomiting and Other (See Comments)    Caused worsening vomiting.   . Aspirin Hives and Itching  . Bactrim [Sulfamethoxazole-Trimethoprim] Itching  . Keflex [Cephalexin] Hives, Itching and Nausea And Vomiting  . Penicillins Hives, Itching and Other (See Comments)    Has patient had a PCN reaction causing immediate rash, facial/tongue/throat swelling, SOB or lightheadedness with hypotension: yes Has patient had a PCN reaction causing severe rash involving mucus membranes or skin necrosis: yes Has patient had a PCN  reaction that required hospitalization no Has patient had a PCN reaction occurring within the last 10 years: yes If all of the above answers are "NO", then may proceed with Cephalospor       Assessment & Plan:   1. ESRD on dialysis [facility]) Recommend:  The patient is experiencing increasing problems with their dialysis access.  Patient should have a left upper extremity thrombectomy with the intention for intervention.  The intention for intervention is to restore appropriate flow and possible loss of the access.  As well as improve the quality of dialysis therapy.  The risks, benefits and alternative therapies were reviewed in detail with the patient.  All questions were answered.  The patient agrees to proceed with angio/intervention.      2. Essential hypertension Patient's blood pressure is well controlled today however there is the concern that during dialysis his blood pressure drops too low and with him continuing to take his blood pressure medicines it drops even lower which causes him to thrombosis.  The patient also notes that it always happens from his last treatment on Friday to the next treatment on Monday.  I suspect that those extra few days are allowing him to thrombose.  I have advised the patient to discuss with the dialysis center as well as his primary care doctor about possibly adjusting medications to see if this may help stop the issue of recurrent thrombosing of his graft.  3. Diabetes mellitus type 2 with complications, uncontrolled ([place]) The patient does state that he has had issues with controlling his blood sugar lately.  His most recent A1c was 10.  This certainly can help thrombotic factors proliferate in the setting of hypotension.  The patient's recent diabetic ketoacidosis episode may have contributed as well.  Patient will be seeing his primary care soon.  Patient on appropriate medication and has been titrating after recent hospitalization.  Primary care will  continue to manage.   Current Outpatient Medications on File Prior to Visit  Medication Sig Dispense Refill  . amLODipine (NORVASC) 5 MG tablet Take  5 mg by mouth daily.    [name] atorvastatin (LIPITOR) 10 MG tablet Take 10 mg by mouth at bedtime.     . brimonidine (ALPHAGAN) 0.15 % ophthalmic solution Place 1 drop into both eyes 3 (three) times daily.     . carvedilol (COREG) 25 MG tablet Take 25 mg by mouth 2 (two) times daily with a meal.    . cyanocobalamin (,VITAMIN B-12,) 1000 MCG/ML injection Inject 1,000 mcg into the muscle every 30 (thirty) days.   1  . folic acid (FOLVITE) 1 MG tablet Take 1 mg by mouth daily.    . metoCLOPramide (REGLAN) 5 MG tablet Take 1 tablet (5 mg total) by mouth daily before breakfast. 30 tablet 5  . NOVOLOG MIX 70/30 FLEXPEN (70-30) 100 UNIT/ML FlexPen 25-28 Units by Subconjunctival route See admin instructions. Inject 28 units subcutaneously in the morning & inject 25 units subcutaneously in the evening.    . pantoprazole (PROTONIX) 40 MG tablet Take 1 tablet (40 mg total) by mouth 2 (two) times daily before a meal. 180 tablet 3  . potassium chloride SA (K-DUR) 20 MEQ tablet Take 20 mEq by mouth daily at 12 noon.     . promethazine (PHENERGAN) 25 MG tablet Take 12.5 mg by mouth every 6 (six) hours as needed for nausea or vomiting.     . sitaGLIPtin (JANUVIA) 25 MG tablet Take 25 mg by mouth daily.     . sucralfate (CARAFATE) 1 g tablet Take 1 g by mouth 4 (four) times daily.     . timolol (TIMOPTIC) 0.5 % ophthalmic solution Place 1 drop into both eyes 2 (two) times daily.    [name] torsemide (DEMADEX) 20 MG tablet Take 40 mg by mouth daily at 12 noon.      No current facility-administered medications on file prior to visit.    There are no Patient Instructions on file for this visit. No follow-ups on file.   Kris Hartmann, NP

## 2019-11-30 ENCOUNTER — Telehealth (INDEPENDENT_AMBULATORY_CARE_PROVIDER_SITE_OTHER): Payer: Self-pay

## 2019-11-30 DIAGNOSIS — N186 End stage renal disease: Secondary | ICD-10-CM | POA: Diagnosis not present

## 2019-11-30 DIAGNOSIS — Z992 Dependence on renal dialysis: Secondary | ICD-10-CM | POA: Diagnosis not present

## 2019-11-30 NOTE — Telephone Encounter (Signed)
Spoke with the patient and he is now scheduled with Dr. Delana Meyer for a left arm graft thrombectomy. Patient was offered 12/06/19 but declined. Patient procedure is on 12/13/19 with a 8:00 am arrival time to the [facility]. Patient will do covid testing on 12/09/19 between 8-1 pm at the [facility]. Pre-procedure instructions will be mailed to the patient.

## 2019-12-01 DIAGNOSIS — N184 Chronic kidney disease, stage 4 (severe): Secondary | ICD-10-CM | POA: Diagnosis not present

## 2019-12-01 DIAGNOSIS — K219 Gastro-esophageal reflux disease without esophagitis: Secondary | ICD-10-CM | POA: Diagnosis not present

## 2019-12-01 DIAGNOSIS — E1122 Type 2 diabetes mellitus with diabetic chronic kidney disease: Secondary | ICD-10-CM | POA: Diagnosis not present

## 2019-12-01 DIAGNOSIS — I130 Hypertensive heart and chronic kidney disease with heart failure and stage 1 through stage 4 chronic kidney disease, or unspecified chronic kidney disease: Secondary | ICD-10-CM | POA: Diagnosis not present

## 2019-12-01 DIAGNOSIS — F419 Anxiety disorder, unspecified: Secondary | ICD-10-CM | POA: Diagnosis not present

## 2019-12-01 DIAGNOSIS — E785 Hyperlipidemia, unspecified: Secondary | ICD-10-CM | POA: Diagnosis not present

## 2019-12-01 DIAGNOSIS — D631 Anemia in chronic kidney disease: Secondary | ICD-10-CM | POA: Diagnosis not present

## 2019-12-02 DIAGNOSIS — N186 End stage renal disease: Secondary | ICD-10-CM | POA: Diagnosis not present

## 2019-12-02 DIAGNOSIS — Z992 Dependence on renal dialysis: Secondary | ICD-10-CM | POA: Diagnosis not present

## 2019-12-05 DIAGNOSIS — Z992 Dependence on renal dialysis: Secondary | ICD-10-CM | POA: Diagnosis not present

## 2019-12-05 DIAGNOSIS — N186 End stage renal disease: Secondary | ICD-10-CM | POA: Diagnosis not present

## 2019-12-07 DIAGNOSIS — N186 End stage renal disease: Secondary | ICD-10-CM | POA: Diagnosis not present

## 2019-12-07 DIAGNOSIS — Z992 Dependence on renal dialysis: Secondary | ICD-10-CM | POA: Diagnosis not present

## 2019-12-07 NOTE — Progress Notes (Signed)
Referring Provider: [name], MD Primary Care Physician:  [name], MD Primary GI Physician: Dr. Gala Romney  Chief Complaint  Patient presents with  . gastroparesis    f/u. Doing okay. wants pancreas checked states blood sugars running high     HPI:   Timothy Clark is a 49 y.o. male presenting today for follow-up.  History of chronic abdominal pain, GERD, esophagitis, gastritis, suspected gastroparesis, recurrent nausea and vomiting, H. pylori s/p eradication in 2016, type 2 diabetes, diastolic dysfunction, end-stage renal disease on hemodialysis currently scheduled for thrombectomy of left forearm loop graft due to recurrent occlusion. GES on 06/15/2015 with rapid gastric transit.  [facility] question dumping syndrome, but EGD at [facility] January 2017 with mild esophagitis and old gastric contents.  Felt clinically consistent with gastroparesis.  RUQ ultrasound March 2019 normal.  CT November 2019 with fatty liver, gallbladder sludge.  HIDA scan normal in June 2020.  No prior colonoscopy.   He was last seen in our office on 07/14/2019.  He was doing well taking Reglan in the morning and at lunch.  Was also taking Protonix twice daily.  No nausea or vomiting, reflux symptoms, dysphagia, abdominal pain, or unintentional weight loss.  Had recently started dialysis at that time and felt a lot of the symptoms had improved since starting dialysis.  Was having BMs daily and no longer needing MiraLAX.  No alarm symptoms.  Did not want to pursue colonoscopy at that time.  Denied side effects to Reglan.  As patient felt symptoms had significantly improved with dialysis, discussed possibly trying to decrease Reglan to once daily.  He was agreeable to this.  New prescription for Reglan was sent to his pharmacy as he received a pill pack.  Also reemphasized gastroparesis diet handout provided.  He was to call us if symptoms worsened after decreasing dosage.  Otherwise would continue current medications and  follow-up in 4-6 months.  Today:  Since receiving COVID vaccine yesterday, he has a headache and nausea this morning. Had [place] in December.   Prior to yesterday, he had been doing pretty well. Blood sugars had been high. Was admitted in March for DKA.  Insulin was increased. Since then, sugars have been under better control. A1C down to 8, was up to 10.  Patient reports his nutritionist asked about having his pancreas checked due to blood sugars being elevated.  Stated he may be able to take pancreatic enzymes.  Discussed that pancreatic enzymes are for exocrine pancreatic dysfunction.  He denies diarrhea, oily or greasy-looking stools, or weight loss. Do not think checking fecal fat or elastase would be helpful as patient is without symptoms of exocrine pancreatic insufficiency.  Advised that his nutritionist could contact me and I would be glad to discuss with her further.  Had not been having nausea, vomiting, abdominal pain, or reflux symptoms. No dysphagia.Weight is stable. Appetite has been good. No early satiety. Continues taking Reglan once daily and Protonix twice daily.  Also taking Carafate.  Has not been needing Phenergan.  States he forgot he had this but should have taken it this morning due to nausea. BMs daily. No bright red blood per rectum or melena. No constipation or diarrhea.    Woke up Monday morning with cloudy vision in his left eye.  He is legally blind in his right eye. Has eye appointment today.   No fever or chills. Some lightheadedness since COVID vaccine. Continues with dialysis 3 days a week via permacath.  No CP, palpitations, SOB or cough.   Desires to discuss colonoscopy at next visit.  When I was getting ready to walk out of the room, patient states he was going to vomit.  Ultimately, patient vomited in the trash can.  No hematemesis.  Vomiting resolved.  Patient felt he was okay to go home and will take him again when he returned home.  Past Medical History:   Diagnosis Date  . Anxiety   . CHF (congestive heart failure) (HCC)    diastolic dysfunction  . Chronic abdominal pain   . COVID-19 07/2019  . Depression   . Esophagitis   . ESRD on hemodialysis ([place])   . Eye hemorrhage   . Gastritis   . Gastroparesis   . GERD (gastroesophageal reflux disease)   . Glaucoma   . Helicobacter pylori (H. pylori) infection 2016   ERADICATION DOCUMENTED VIA STOOL TEST in AUG 2016 at [facility]  . Hiccups   . Hypertension   . Nausea and vomiting    chronic, recurrent  . Neuropathy   . Renal insufficiency   . Type 2 diabetes mellitus with complications (HCC)    diagnosed around age 54    Past Surgical History:  Procedure Laterality Date  . AMPUTATION TOE Right    great toe  . AV FISTULA INSERTION W/ RF MAGNETIC GUIDANCE Left 04/06/2019   Procedure: AV FISTULA INSERTION W/RF MAGNETIC GUIDANCE;  Surgeon: Katha Cabal, MD;  Location: [facility];  Service: Cardiovascular;  Laterality: Left;  . AV FISTULA PLACEMENT Left 05/06/2019   Procedure: INSERTION OF ARTERIOVENOUS (AV) GORE-TEX GRAFT ARM ( FOREARM LOOP );  Surgeon: Katha Cabal, MD;  Location: [facility];  Service: Vascular;  Laterality: Left;  . CATARACT EXTRACTION W/PHACO Left 05/19/2016   Procedure: CATARACT EXTRACTION PHACO AND INTRAOCULAR LENS PLACEMENT LEFT EYE;  Surgeon: Tonny Branch, MD;  [facility]: [facility];  Service: Ophthalmology;  Laterality: Left;  CDE: 7.30  . CATARACT EXTRACTION W/PHACO Right 06/23/2016   Procedure: CATARACT EXTRACTION PHACO AND INTRAOCULAR LENS PLACEMENT RIGHT EYE CDE=9.87;  Surgeon: Tonny Branch, MD;  Location: [facility];  Service: Ophthalmology;  Laterality: Right;  right  . DIALYSIS/PERMA CATHETER REMOVAL N/A 07/19/2019   Procedure: DIALYSIS/PERMA CATHETER REMOVAL;  Surgeon: Katha Cabal, MD;  Location: [place] [facility];  Service: Cardiovascular;  Laterality: N/A;  . ESOPHAGOGASTRODUODENOSCOPY  2015   Dr. Britta Mccreedy  . ESOPHAGOGASTRODUODENOSCOPY N/A  10/26/2014   RMR: Distal esophagititis-likely reflux related although an element of pill induced injuruy no exclueded  status post biopsy. Diffusely abnormal gastric mucosa of uncertain signigicane -status post gastric biopsy. Focal area of excoriation in the cardia most consistant with a trauma of heaving.   . ESOPHAGOGASTRODUODENOSCOPY  08/2015   [facility]: mild esophagitis and old gastric contents  . EYE SURGERY  2015   stent placed to left eye  . EYE SURGERY  06/2019   per patient- to remove scar tissue  . INCISION AND DRAINAGE OF WOUND Right 07/16/2017   Procedure: IRRIGATION AND DEBRIDEMENT OF SOFT TISSUE OF ULCERATION RIGHT FOOT;  Surgeon: Caprice Beaver, DPM;  Location: [facility];  Service: Podiatry;  Laterality: Right;  . PERIPHERAL VASCULAR THROMBECTOMY Left 09/27/2019   Procedure: PERIPHERAL VASCULAR THROMBECTOMY;  Surgeon: Katha Cabal, MD;  Location: [place] [facility];  Service: Cardiovascular;  Laterality: Left;  . PERIPHERAL VASCULAR THROMBECTOMY Left 10/27/2019   Procedure: PERIPHERAL VASCULAR THROMBECTOMY;  Surgeon: Algernon Huxley, MD;  Location: [place] [facility] [facility];  Service: Cardiovascular;  Laterality: Left;    Current Outpatient Medications  Medication Sig Dispense Refill  . amLODipine (NORVASC) 5 MG tablet Take 5 mg by mouth daily.    [name] atorvastatin (LIPITOR) 10 MG tablet Take 10 mg by mouth at bedtime.     . brimonidine (ALPHAGAN) 0.15 % ophthalmic solution Place 1 drop into both eyes in the morning and at bedtime.     . carvedilol (COREG) 25 MG tablet Take 25 mg by mouth 2 (two) times daily with a meal.    . cyanocobalamin (,VITAMIN B-12,) 1000 MCG/ML injection Inject 1,000 mcg into the muscle every 30 (thirty) days.   1  . folic acid (FOLVITE) 1 MG tablet Take 1 mg by mouth daily.    . metoCLOPramide (REGLAN) 5 MG tablet Take 1 tablet (5 mg total) by mouth daily before breakfast. 30 tablet 5  . NOVOLOG MIX 70/30 FLEXPEN (70-30) 100 UNIT/ML FlexPen 38-50 Units by  Subconjunctival route See admin instructions. Inject 50 units subcutaneously in the morning & inject 30 units subcutaneously in the evening.    . pantoprazole (PROTONIX) 40 MG tablet Take 1 tablet (40 mg total) by mouth 2 (two) times daily before a meal. 180 tablet 3  . promethazine (PHENERGAN) 25 MG tablet Take 12.5 mg by mouth every 6 (six) hours as needed for nausea or vomiting.     . sertraline (ZOLOFT) 50 MG tablet Take 50 mg by mouth daily.    . sitaGLIPtin (JANUVIA) 25 MG tablet Take 25 mg by mouth daily.     . sucralfate (CARAFATE) 1 g tablet Take 1 g by mouth 4 (four) times daily.     . timolol (TIMOPTIC) 0.5 % ophthalmic solution Place 1 drop into both eyes 2 (two) times daily.    [name] torsemide (DEMADEX) 20 MG tablet Take 40 mg by mouth daily.      No current facility-administered medications for this visit.    Allergies as of 12/08/2019 - Review Complete 12/08/2019  Allergen Reaction Noted  . Donnatal [phenobarbital-belladonna alk] Anaphylaxis and Other (See Comments) 07/12/2015  . Fish allergy Anaphylaxis, Hives, and Rash 01/14/2015  . Haldol [haloperidol lactate] Other (See Comments) 08/07/2015  . Maalox [calcium carbonate antacid] Anaphylaxis and Other (See Comments) 07/12/2015  . Shellfish allergy Hives 09/27/2019  . Marinol [dronabinol] Nausea And Vomiting and Other (See Comments) 05/28/2018  . Aspirin Hives and Itching 03/22/2009  . Bactrim [sulfamethoxazole-trimethoprim] Itching 12/06/2014  . Keflex [cephalexin] Hives, Itching, and Nausea And Vomiting 06/22/2017  . Penicillins Hives, Itching, and Other (See Comments) 03/22/2009    Family History  Problem Relation Age of Onset  . Ovarian cancer Mother   . Heart attack Father   . Colon polyps Father   . Cervical cancer Sister   . Diabetes Sister   . Colon cancer Neg Hx     Social History   Socioeconomic History  . Marital status: Single    Spouse name: Not on file  . Number of children: Not on file  . Years of  education: Not on file  . Highest education level: Not on file  Occupational History  . Not on file  Tobacco Use  . Smoking status: Never Smoker  . Smokeless tobacco: Never Used  Substance and Sexual Activity  . Alcohol use: No    Alcohol/week: 0.0 standard drinks  . Drug use: No  . Sexual activity: Yes  Other Topics Concern  . Not on file  Social History Narrative   Lives with dad and sister  Social Determinants of Health   Financial Resource Strain:   . Difficulty of Paying Living Expenses:   Food Insecurity:   . Worried About Charity fundraiser in the Last Year:   . Arboriculturist in the Last Year:   Transportation Needs:   . Film/video editor (Medical):   [name] Lack of Transportation (Non-Medical):   Physical Activity:   . Days of Exercise per Week:   . Minutes of Exercise per Session:   Stress:   . Feeling of Stress :   Social Connections:   . Frequency of Communication with Friends and Family:   . Frequency of Social Gatherings with Friends and Family:   . Attends Religious Services:   . Active Member of Clubs or Organizations:   . Attends Archivist Meetings:   [name] Marital Status:     Review of Systems: Gen: See HPI CV: See HPI. Resp: See HPI GI: See HPI Derm: Denies rash Psych: Denies depression or anxiety Heme: See HPI.  Physical Exam: BP 137/85   Pulse 84   Temp 97.8 F (36.6 C) (Oral)   Ht 6' (1.829 m)   Wt 279 lb 6.4 oz (126.7 kg)   BMI 37.89 kg/m  General:   Alert and oriented. Pleasant and cooperative.  Patient began vomiting as I was getting ready to leave exam room at end of visit. Head:  Normocephalic and atraumatic. Eyes:  Conjuctiva clear without scleral icterus. Heart:  S1, S2 present without murmurs appreciated.  Lungs:  Clear to auscultation bilaterally. No wheezes, rales, or rhonchi. No distress.  Abdomen:  +BS, soft, non-tender and non-distended. No rebound or guarding. No HSM or masses noted. Msk:  Symmetrical  without gross deformities. Normal posture. Extremities:  Without edema.  Significant dry skin with flaking of lower extremities. Neurologic:  Alert and  oriented x4 Psych:  Normal mood and affect.

## 2019-12-08 ENCOUNTER — Ambulatory Visit: Payer: Medicare HMO | Admitting: Gastroenterology

## 2019-12-08 ENCOUNTER — Encounter: Payer: Self-pay | Admitting: Gastroenterology

## 2019-12-08 ENCOUNTER — Other Ambulatory Visit: Payer: Self-pay

## 2019-12-08 VITALS — BP 137/85 | HR 84 | Temp 97.8°F | Ht 72.0 in | Wt 279.4 lb

## 2019-12-08 DIAGNOSIS — K219 Gastro-esophageal reflux disease without esophagitis: Secondary | ICD-10-CM

## 2019-12-08 DIAGNOSIS — K3184 Gastroparesis: Secondary | ICD-10-CM | POA: Diagnosis not present

## 2019-12-08 DIAGNOSIS — R112 Nausea with vomiting, unspecified: Secondary | ICD-10-CM | POA: Insufficient documentation

## 2019-12-08 NOTE — Assessment & Plan Note (Signed)
49 year old male with past medical history of gastroparesis.  GES 06/15/2015 with rapid gastric transit.  [facility] question dumping syndrome, but EGD at [facility] January 2017 with mild esophagitis and old gastric contents.  Felt clinically consistent with gastroparesis.  At his last visit, he was doing well since starting on hemodialysis and his Reglan was decreased from twice daily to once daily.  He continued on Protonix twice daily.  In the interim, patient has continued to do well without nausea, vomiting, abdominal pain, or reflux symptoms.  No early satiety.  Weight has been stable.  Unfortunately, after receiving second Moderna Vaccine yesterday, he woke up this morning with headache and nausea.  As I was getting ready to walk out of the room, patient vomited.  No hematemesis.  Symptoms resolved and patient felt he was okay to go home.  Stated he would take Phenergan when he got home.  I suspect his nausea and vomiting this morning are secondary to side effects from Covid vaccine.  Do not suspect this is related to his gastroparesis.  He was advised to continue to monitor symptoms and let me know if he does not improve over the next couple of days.  Continue Reglan 5 mg once every morning.  Discussed monitoring for side effects including unintentional movements, face twitching, depression, or suicidal thoughts and let us know immediately if these occur. Continue Protonix 40 mg twice daily. Continue using Phenergan as needed. Okay to stop Carafate. Follow-up in 6 months.  Call if nausea/vomiting does not resolve.

## 2019-12-08 NOTE — Assessment & Plan Note (Signed)
GERD symptoms well controlled on Protonix 40 mg twice daily.  No alarm symptoms.  Plan to continue current medications.  Follow-up in 6 months.

## 2019-12-08 NOTE — Patient Instructions (Addendum)
I'm sorry you are feeling poorly today. I suspect this is likely secondary to the 2nd Covid vaccine.  For now, we will continue your current medications for the management of your gastroparesis as you have been doing very well prior to receiving your 2nd vaccine.  Continue Reglan 5 mg once every morning. Monitor for side effects including unintentional movements or face twitching, depression or suicidal thoughts and let me know immediately.   Continue Protonix 40 mg twice daily.  You may continue using Phenergan as needed.  It is okay to stop using Carafate for now.  We will plan to see you back in 6 months. Let me know if your nausea doesn't continue to improve over the next couple of days.   Aliene Altes, PA-C [facility]

## 2019-12-09 ENCOUNTER — Other Ambulatory Visit
Admission: RE | Admit: 2019-12-09 | Discharge: 2019-12-09 | Disposition: A | Discharge status: Home / Self Care | Payer: Medicare HMO | Source: Ambulatory Visit | Attending: Vascular Surgery | Admitting: Vascular Surgery

## 2019-12-09 DIAGNOSIS — Z20822 Contact with and (suspected) exposure to covid-19: Secondary | ICD-10-CM | POA: Insufficient documentation

## 2019-12-09 DIAGNOSIS — N186 End stage renal disease: Secondary | ICD-10-CM | POA: Diagnosis not present

## 2019-12-09 DIAGNOSIS — Z01812 Encounter for preprocedural laboratory examination: Secondary | ICD-10-CM | POA: Diagnosis not present

## 2019-12-09 DIAGNOSIS — Z992 Dependence on renal dialysis: Secondary | ICD-10-CM | POA: Diagnosis not present

## 2019-12-09 LAB — SARS CORONAVIRUS 2 (TAT 6-24 HRS): SARS Coronavirus 2: NEGATIVE

## 2019-12-12 ENCOUNTER — Other Ambulatory Visit (INDEPENDENT_AMBULATORY_CARE_PROVIDER_SITE_OTHER): Payer: Self-pay | Admitting: Nurse Practitioner

## 2019-12-12 DIAGNOSIS — N186 End stage renal disease: Secondary | ICD-10-CM | POA: Diagnosis not present

## 2019-12-12 DIAGNOSIS — Z992 Dependence on renal dialysis: Secondary | ICD-10-CM | POA: Diagnosis not present

## 2019-12-12 MED ORDER — CLINDAMYCIN PHOSPHATE 300 MG/50ML IV SOLN: 300.0000 mg | Freq: Once | INTRAVENOUS | Status: AC

## 2019-12-13 ENCOUNTER — Encounter: Payer: Self-pay | Admitting: Vascular Surgery

## 2019-12-13 ENCOUNTER — Ambulatory Visit
Admission: RE | Admit: 2019-12-13 | Discharge: 2019-12-13 | Disposition: A | Discharge status: Home / Self Care | Payer: Medicare HMO | Attending: Vascular Surgery | Admitting: Vascular Surgery

## 2019-12-13 ENCOUNTER — Encounter
Admission: RE | Disposition: A | Discharge status: Home / Self Care | Payer: Self-pay | Source: Home / Self Care | Attending: Vascular Surgery

## 2019-12-13 ENCOUNTER — Other Ambulatory Visit: Payer: Self-pay

## 2019-12-13 DIAGNOSIS — I132 Hypertensive heart and chronic kidney disease with heart failure and with stage 5 chronic kidney disease, or end stage renal disease: Secondary | ICD-10-CM | POA: Insufficient documentation

## 2019-12-13 DIAGNOSIS — Y841 Kidney dialysis as the cause of abnormal reaction of the patient, or of later complication, without mention of misadventure at the time of the procedure: Secondary | ICD-10-CM | POA: Diagnosis not present

## 2019-12-13 DIAGNOSIS — Z992 Dependence on renal dialysis: Secondary | ICD-10-CM | POA: Diagnosis not present

## 2019-12-13 DIAGNOSIS — Z79899 Other long term (current) drug therapy: Secondary | ICD-10-CM | POA: Diagnosis not present

## 2019-12-13 DIAGNOSIS — N186 End stage renal disease: Secondary | ICD-10-CM | POA: Insufficient documentation

## 2019-12-13 DIAGNOSIS — T82898A Other specified complication of vascular prosthetic devices, implants and grafts, initial encounter: Secondary | ICD-10-CM | POA: Diagnosis not present

## 2019-12-13 DIAGNOSIS — F419 Anxiety disorder, unspecified: Secondary | ICD-10-CM | POA: Insufficient documentation

## 2019-12-13 DIAGNOSIS — I509 Heart failure, unspecified: Secondary | ICD-10-CM | POA: Insufficient documentation

## 2019-12-13 DIAGNOSIS — E1140 Type 2 diabetes mellitus with diabetic neuropathy, unspecified: Secondary | ICD-10-CM | POA: Insufficient documentation

## 2019-12-13 DIAGNOSIS — F329 Major depressive disorder, single episode, unspecified: Secondary | ICD-10-CM | POA: Diagnosis not present

## 2019-12-13 DIAGNOSIS — Z794 Long term (current) use of insulin: Secondary | ICD-10-CM | POA: Insufficient documentation

## 2019-12-13 DIAGNOSIS — E1122 Type 2 diabetes mellitus with diabetic chronic kidney disease: Secondary | ICD-10-CM | POA: Insufficient documentation

## 2019-12-13 DIAGNOSIS — K219 Gastro-esophageal reflux disease without esophagitis: Secondary | ICD-10-CM | POA: Insufficient documentation

## 2019-12-13 DIAGNOSIS — T82868A Thrombosis of vascular prosthetic devices, implants and grafts, initial encounter: Secondary | ICD-10-CM | POA: Diagnosis not present

## 2019-12-13 HISTORY — PX: PERIPHERAL VASCULAR THROMBECTOMY: CATH118306

## 2019-12-13 LAB — POTASSIUM (ARMC VASCULAR LAB ONLY): Potassium (ARMC vascular lab): 3.6 (ref 3.5–5.1)

## 2019-12-13 LAB — GLUCOSE, CAPILLARY: Glucose-Capillary: 218 mg/dL — ABNORMAL HIGH (ref 70–99)

## 2019-12-13 SURGERY — PERIPHERAL VASCULAR THROMBECTOMY
Anesthesia: Moderate Sedation | Laterality: Left

## 2019-12-13 MED ORDER — FENTANYL CITRATE (PF) 100 MCG/2ML IJ SOLN
INTRAMUSCULAR | Status: AC
  Filled 2019-12-13: qty 2

## 2019-12-13 MED ORDER — METHYLPREDNISOLONE SODIUM SUCC 125 MG IJ SOLR
INTRAMUSCULAR | Status: AC
  Administered 2019-12-13: 125 mg via INTRAVENOUS
  Filled 2019-12-13: qty 2

## 2019-12-13 MED ORDER — FAMOTIDINE 20 MG PO TABS
ORAL_TABLET | ORAL | Status: AC
  Administered 2019-12-13: 40 mg via ORAL
  Filled 2019-12-13: qty 1

## 2019-12-13 MED ORDER — DIPHENHYDRAMINE HCL 50 MG/ML IJ SOLN: 50.0000 mg | Freq: Once | INTRAMUSCULAR | Status: AC | PRN

## 2019-12-13 MED ORDER — METHYLPREDNISOLONE SODIUM SUCC 125 MG IJ SOLR: 125.0000 mg | Freq: Once | INTRAMUSCULAR | Status: AC | PRN

## 2019-12-13 MED ORDER — MIDAZOLAM HCL 2 MG/ML PO SYRP: 8.0000 mg | ORAL_SOLUTION | Freq: Once | ORAL | Status: DC | PRN

## 2019-12-13 MED ORDER — HEPARIN SODIUM (PORCINE) 1000 UNIT/ML IJ SOLN
INTRAMUSCULAR | Status: DC | PRN
  Administered 2019-12-13: 4000 [IU] via INTRAVENOUS

## 2019-12-13 MED ORDER — ONDANSETRON HCL 4 MG/2ML IJ SOLN: 4.0000 mg | Freq: Four times a day (QID) | INTRAMUSCULAR | Status: DC | PRN

## 2019-12-13 MED ORDER — FAMOTIDINE 20 MG PO TABS: 40.0000 mg | ORAL_TABLET | Freq: Once | ORAL | Status: AC | PRN

## 2019-12-13 MED ORDER — CLINDAMYCIN PHOSPHATE 300 MG/50ML IV SOLN
INTRAVENOUS | Status: AC
  Administered 2019-12-13: 300 mg via INTRAVENOUS
  Filled 2019-12-13: qty 50

## 2019-12-13 MED ORDER — FENTANYL CITRATE (PF) 100 MCG/2ML IJ SOLN
INTRAMUSCULAR | Status: DC | PRN
  Administered 2019-12-13: 50 ug via INTRAVENOUS

## 2019-12-13 MED ORDER — DIPHENHYDRAMINE HCL 50 MG/ML IJ SOLN
INTRAMUSCULAR | Status: AC
  Administered 2019-12-13: 25 mg via INTRAVENOUS
  Filled 2019-12-13: qty 1

## 2019-12-13 MED ORDER — HYDROMORPHONE HCL 1 MG/ML IJ SOLN: 1.0000 mg | Freq: Once | INTRAMUSCULAR | Status: DC | PRN

## 2019-12-13 MED ORDER — FAMOTIDINE 20 MG PO TABS
ORAL_TABLET | ORAL | Status: AC
  Filled 2019-12-13: qty 1

## 2019-12-13 MED ORDER — HEPARIN SODIUM (PORCINE) 1000 UNIT/ML IJ SOLN
INTRAMUSCULAR | Status: AC
  Filled 2019-12-13: qty 1

## 2019-12-13 MED ORDER — MIDAZOLAM HCL 5 MG/5ML IJ SOLN
INTRAMUSCULAR | Status: AC
  Filled 2019-12-13: qty 5

## 2019-12-13 MED ORDER — SODIUM CHLORIDE 0.9 % IV SOLN: INTRAVENOUS | Status: DC

## 2019-12-13 MED ORDER — MIDAZOLAM HCL 2 MG/2ML IJ SOLN
INTRAMUSCULAR | Status: DC | PRN
  Administered 2019-12-13: 2 mg via INTRAVENOUS

## 2019-12-13 SURGICAL SUPPLY — 8 items
CATH BEACON 5 .035 65 KMP TIP (CATHETERS) ×2 IMPLANT
DEVICE TORQUE .025-.038 (MISCELLANEOUS) ×2 IMPLANT
DRAPE BRACHIAL (DRAPES) ×2 IMPLANT
GUIDEWIRE ANGLED .035 180CM (WIRE) ×2 IMPLANT
NEEDLE ENTRY 21GA 7CM ECHOTIP (NEEDLE) ×2 IMPLANT
PACK ANGIOGRAPHY (CUSTOM PROCEDURE TRAY) ×2 IMPLANT
SET INTRO CAPELLA COAXIAL (SET/KITS/TRAYS/PACK) ×2 IMPLANT
SHEATH BRITE TIP 6FRX5.5 (SHEATH) ×4 IMPLANT

## 2019-12-13 NOTE — Op Note (Signed)
OPERATIVE NOTE   PROCEDURE: 1. Contrast injection left forearm loop graft  PRE-OPERATIVE DIAGNOSIS: Complication of dialysis access                                                       End Stage Renal Disease  POST-OPERATIVE DIAGNOSIS: same as above   SURGEON: Katha Cabal, M.D.  ANESTHESIA: Conscious sedation was administered under my direct supervision by the interventional radiology RN.  IV Versed plus fentanyl were utilized. Continuous ECG, pulse oximetry and blood pressure was monitored throughout the entire procedure.  Conscious sedation was for a total of 20 minutes.  ESTIMATED BLOOD LOSS: minimal  FINDING(S): 1. Stricture of the proximal basilic vein  SPECIMEN(S):  None  CONTRAST: 20 cc  FLUOROSCOPY TIME: 1.1 minutes  INDICATIONS: Timothy Clark is a 49 y.o. male who  presents with malfunctioning left forearm AV access.  The patient is scheduled for angiography with possible intervention of the AV access to prevent loss of the permanent access.  The patient is aware the risks include but are not limited to: bleeding, infection, thrombosis of the cannulated access, and possible anaphylactic reaction to the contrast.  The patient acknowledges if the access can not be salvaged a tunneled catheter will be needed and will be placed during this procedure.  The patient is aware of the risks of the procedure and elects to proceed with the angiogram and intervention.  DESCRIPTION: After full informed written consent was obtained, the patient was brought back to the Special Procedure suite and placed supine position.  Appropriate cardiopulmonary monitors were placed.  The left arm was prepped and draped in the standard fashion.  Appropriate timeout is called.   The left forearm loop access was cannulated with a micropuncture needle under ultrasound guidence.  Ultrasound was used to evaluate the left forearm AV access.  It was echogenic indicating it was thrombosed.  An  ultrasound image was acquired for the permanent record.  A micropuncture needle was used to access the left forearm AV access under direct ultrasound guidance.  The microwire was then advanced under fluoroscopic guidance without difficulty followed by the micro-sheath.  The J-wire was then advanced and a 6 Fr sheath inserted.  Hand injections were completed to image the access from the arterial anastomosis through the entire access.  The central venous structures were also imaged by hand injections.  Based on the images, there is thrombus within the loop graft.  Previously placed Viabahn stents now extend from the forearm venous anastomosis to the more proximal upper arm.  The stricture at the venous outflow now extends up to the axillary vein.  Central veins appear widely patent  A 4-0 Monocryl purse-string suture was sewn around the sheath.  The sheath was removed and light pressure was applied.  A sterile bandage was applied to the puncture site.  Summary: The patient currently has adequate access via a tunneled catheter.  Loop forearm graft is thrombosed.  Previously placed Viabahn stents now extend to the proximal upper arm.  Will be to continue treating this venous outflow stricture it would compromise the ability to create a upper arm brachial axillary AV graft.  Therefore we will not pursue intervention and will plan for creating a new left upper arm brachial axillary AV graft.    COMPLICATIONS: None CONDITION: Good  Katha Cabal, M.D [facility] Office: [phone]  12/13/2019 10:19 AM

## 2019-12-13 NOTE — H&P (Signed)
[facility] History & Physical Update  The patient was interviewed and re-examined.  The patient's previous History and Physical has been reviewed and is unchanged.  There is no change in the plan of care. We plan to proceed with the scheduled procedure.  [name], MD  12/13/2019, 9:13 AM

## 2019-12-14 DIAGNOSIS — N186 End stage renal disease: Secondary | ICD-10-CM | POA: Diagnosis not present

## 2019-12-14 DIAGNOSIS — Z992 Dependence on renal dialysis: Secondary | ICD-10-CM | POA: Diagnosis not present

## 2019-12-15 ENCOUNTER — Ambulatory Visit (INDEPENDENT_AMBULATORY_CARE_PROVIDER_SITE_OTHER): Payer: Medicare HMO | Admitting: Ophthalmology

## 2019-12-15 ENCOUNTER — Other Ambulatory Visit: Payer: Self-pay

## 2019-12-15 ENCOUNTER — Encounter (INDEPENDENT_AMBULATORY_CARE_PROVIDER_SITE_OTHER): Payer: Self-pay | Admitting: Ophthalmology

## 2019-12-15 DIAGNOSIS — H4312 Vitreous hemorrhage, left eye: Secondary | ICD-10-CM

## 2019-12-15 DIAGNOSIS — E113552 Type 2 diabetes mellitus with stable proliferative diabetic retinopathy, left eye: Secondary | ICD-10-CM | POA: Insufficient documentation

## 2019-12-15 DIAGNOSIS — Z09 Encounter for follow-up examination after completed treatment for conditions other than malignant neoplasm: Secondary | ICD-10-CM | POA: Insufficient documentation

## 2019-12-15 DIAGNOSIS — E113532 Type 2 diabetes mellitus with proliferative diabetic retinopathy with traction retinal detachment not involving the macula, left eye: Secondary | ICD-10-CM | POA: Insufficient documentation

## 2019-12-15 DIAGNOSIS — H4052X3 Glaucoma secondary to other eye disorders, left eye, severe stage: Secondary | ICD-10-CM

## 2019-12-15 DIAGNOSIS — E113512 Type 2 diabetes mellitus with proliferative diabetic retinopathy with macular edema, left eye: Secondary | ICD-10-CM

## 2019-12-15 DIAGNOSIS — E1121 Type 2 diabetes mellitus with diabetic nephropathy: Secondary | ICD-10-CM | POA: Insufficient documentation

## 2019-12-15 HISTORY — DX: Vitreous hemorrhage, left eye: H43.12

## 2019-12-15 MED ORDER — BEVACIZUMAB CHEMO INJECTION 1.25MG/0.05ML SYRINGE FOR KALEIDOSCOPE
1.2500 mg | INTRAVITREAL | Status: AC | PRN
  Administered 2019-12-15: 1.25 mg via INTRAVITREAL

## 2019-12-15 NOTE — Progress Notes (Signed)
12/15/2019     CHIEF COMPLAINT Patient presents for Diabetic Eye Exam   HISTORY OF PRESENT ILLNESS: Timothy Clark is a 49 y.o. male who presents to the clinic today for:   HPI    Diabetic Eye Exam    Vision is worsening.  Associated Symptoms Negative for Flashes and Floaters.  Diabetes characteristics include Type 2.  Blood sugar level is controlled.  Last Blood Glucose 136.  Last A1C 6.8.  I, the attending physician,  performed the HPI with the patient and updated documentation appropriately.          Comments    4 Month Diabetic Exam OU. OCT  Pt c/o OS becoming extremely cloudy on Saturday. Pt states it has cleared a little but it is still difficult to see. Pt states he cannot see color in OS. Pt saw Dr. Katy Fitch in Feb and will see him again this month. Pt is using gtts as directed per Dr. Katy Fitch       Last edited by Tilda Franco on 12/15/2019  9:19 AM. (History)      Referring physician: [name], MD [place],  [place] [address]  HISTORICAL INFORMATION:   Selected notes from the MEDICAL RECORD NUMBER    Lab Results  Component Value Date   HGBA1C 7.1 (H) 10/25/2018     CURRENT MEDICATIONS: Current Outpatient Medications (Ophthalmic Drugs)  Medication Sig  . brimonidine (ALPHAGAN) 0.15 % ophthalmic solution Place 1 drop into both eyes in the morning and at bedtime.   . timolol (TIMOPTIC) 0.5 % ophthalmic solution Place 1 drop into both eyes 2 (two) times daily.   No current facility-administered medications for this visit. (Ophthalmic Drugs)   Current Outpatient Medications (Other)  Medication Sig  . amLODipine (NORVASC) 5 MG tablet Take 5 mg by mouth daily.  [name] atorvastatin (LIPITOR) 10 MG tablet Take 10 mg by mouth at bedtime.   . carvedilol (COREG) 25 MG tablet Take 25 mg by mouth 2 (two) times daily with a meal.  . cyanocobalamin (,VITAMIN B-12,) 1000 MCG/ML injection Inject 1,000 mcg into the muscle every 30 (thirty) days.   . folic  acid (FOLVITE) 1 MG tablet Take 1 mg by mouth daily.  . metoCLOPramide (REGLAN) 5 MG tablet Take 1 tablet (5 mg total) by mouth daily before breakfast.  . NOVOLOG MIX 70/30 FLEXPEN (70-30) 100 UNIT/ML FlexPen 38-50 Units by Subconjunctival route See admin instructions. Inject 50 units subcutaneously in the morning & inject 30 units subcutaneously in the evening.  . pantoprazole (PROTONIX) 40 MG tablet Take 1 tablet (40 mg total) by mouth 2 (two) times daily before a meal.  . promethazine (PHENERGAN) 25 MG tablet Take 12.5 mg by mouth every 6 (six) hours as needed for nausea or vomiting.   . sertraline (ZOLOFT) 50 MG tablet Take 50 mg by mouth daily.  . sitaGLIPtin (JANUVIA) 25 MG tablet Take 25 mg by mouth daily.   . sucralfate (CARAFATE) 1 g tablet Take 1 g by mouth 4 (four) times daily.   [name] torsemide (DEMADEX) 20 MG tablet Take 40 mg by mouth daily.    No current facility-administered medications for this visit. (Other)      REVIEW OF SYSTEMS: ROS    Positive for: Endocrine   Last edited by Tilda Franco on 12/15/2019  9:11 AM. (History)       ALLERGIES Allergies  Allergen Reactions  . Donnatal [Phenobarbital-Belladonna Alk] Anaphylaxis and Other (See  Comments)    Reaction to GI cocktail  . Fish Allergy Anaphylaxis, Hives and Rash  . Haldol [Haloperidol Lactate] Other (See Comments)    Chest Pain  . Maalox [Calcium Carbonate Antacid] Anaphylaxis and Other (See Comments)    Reaction to GI cocktail  . Shellfish Allergy Hives    Per patient: Patient stated he breaks out in hives when eating shellfish  . Marinol [Dronabinol] Nausea And Vomiting and Other (See Comments)    Caused worsening vomiting.   . Aspirin Hives and Itching  . Bactrim [Sulfamethoxazole-Trimethoprim] Itching  . Keflex [Cephalexin] Hives, Itching and Nausea And Vomiting  . Penicillins Hives, Itching and Other (See Comments)    Has patient had a PCN reaction causing immediate rash, facial/tongue/throat  swelling, SOB or lightheadedness with hypotension: yes Has patient had a PCN reaction causing severe rash involving mucus membranes or skin necrosis: yes Has patient had a PCN reaction that required hospitalization no Has patient had a PCN reaction occurring within the last 10 years: yes If all of the above answers are "NO", then may proceed with Cephalospor    PAST MEDICAL HISTORY Past Medical History:  Diagnosis Date  . Anxiety   . CHF (congestive heart failure) (HCC)    diastolic dysfunction  . Chronic abdominal pain   . COVID-19 07/2019  . Depression   . Esophagitis   . ESRD on hemodialysis ([place])   . Eye hemorrhage   . Gastritis   . Gastroparesis   . GERD (gastroesophageal reflux disease)   . Glaucoma   . Helicobacter pylori (H. pylori) infection 2016   ERADICATION DOCUMENTED VIA STOOL TEST in AUG 2016 at [facility]  . Hiccups   . Hypertension   . Nausea and vomiting    chronic, recurrent  . Neuropathy   . Renal insufficiency   . Type 2 diabetes mellitus with complications (HCC)    diagnosed around age 25   Past Surgical History:  Procedure Laterality Date  . AMPUTATION TOE Right    great toe  . AV FISTULA INSERTION W/ RF MAGNETIC GUIDANCE Left 04/06/2019   Procedure: AV FISTULA INSERTION W/RF MAGNETIC GUIDANCE;  Surgeon: Katha Cabal, MD;  Location: [place] [facility];  Service: Cardiovascular;  Laterality: Left;  . AV FISTULA PLACEMENT Left 05/06/2019   Procedure: INSERTION OF ARTERIOVENOUS (AV) GORE-TEX GRAFT ARM ( FOREARM LOOP );  Surgeon: Katha Cabal, MD;  Location: [facility];  Service: Vascular;  Laterality: Left;  . CATARACT EXTRACTION W/PHACO Left 05/19/2016   Procedure: CATARACT EXTRACTION PHACO AND INTRAOCULAR LENS PLACEMENT LEFT EYE;  Surgeon: Tonny Branch, MD;  Location: [facility];  Service: Ophthalmology;  Laterality: Left;  CDE: 7.30  . CATARACT EXTRACTION W/PHACO Right 06/23/2016   Procedure: CATARACT EXTRACTION PHACO AND INTRAOCULAR LENS PLACEMENT  RIGHT EYE CDE=9.87;  Surgeon: Tonny Branch, MD;  Location: [facility];  Service: Ophthalmology;  Laterality: Right;  right  . DIALYSIS/PERMA CATHETER REMOVAL N/A 07/19/2019   Procedure: DIALYSIS/PERMA CATHETER REMOVAL;  Surgeon: Katha Cabal, MD;  Location: [place] [facility];  Service: Cardiovascular;  Laterality: N/A;  . ESOPHAGOGASTRODUODENOSCOPY  2015   Dr. Britta Mccreedy  . ESOPHAGOGASTRODUODENOSCOPY N/A 10/26/2014   RMR: Distal esophagititis-likely reflux related although an element of pill induced injuruy no exclueded  status post biopsy. Diffusely abnormal gastric mucosa of uncertain signigicane -status post gastric biopsy. Focal area of excoriation in the cardia most consistant with a trauma of heaving.   . ESOPHAGOGASTRODUODENOSCOPY  08/2015   [facility]: mild esophagitis and old  gastric contents  . EYE SURGERY  2015   stent placed to left eye  . EYE SURGERY  06/2019   per patient- to remove scar tissue  . INCISION AND DRAINAGE OF WOUND Right 07/16/2017   Procedure: IRRIGATION AND DEBRIDEMENT OF SOFT TISSUE OF ULCERATION RIGHT FOOT;  Surgeon: Caprice Beaver, DPM;  Location: [facility];  Service: Podiatry;  Laterality: Right;  . PERIPHERAL VASCULAR THROMBECTOMY Left 09/27/2019   Procedure: PERIPHERAL VASCULAR THROMBECTOMY;  Surgeon: Katha Cabal, MD;  Location: [place] [facility];  Service: Cardiovascular;  Laterality: Left;  . PERIPHERAL VASCULAR THROMBECTOMY Left 10/27/2019   Procedure: PERIPHERAL VASCULAR THROMBECTOMY;  Surgeon: Algernon Huxley, MD;  Location: [place] [facility];  Service: Cardiovascular;  Laterality: Left;  . PERIPHERAL VASCULAR THROMBECTOMY Left 12/13/2019   Procedure: PERIPHERAL VASCULAR THROMBECTOMY;  Surgeon: Katha Cabal, MD;  Location: [place] [facility];  Service: Cardiovascular;  Laterality: Left;    FAMILY HISTORY Family History  Problem Relation Age of Onset  . Ovarian cancer Mother   . Heart attack Father   . Colon polyps Father   . Cervical cancer  Sister   . Diabetes Sister   . Colon cancer Neg Hx     SOCIAL HISTORY Social History   Tobacco Use  . Smoking status: Never Smoker  . Smokeless tobacco: Never Used  Substance Use Topics  . Alcohol use: No    Alcohol/week: 0.0 standard drinks  . Drug use: No         OPHTHALMIC EXAM:  Base Eye Exam    Visual Acuity (Snellen - Linear)      Right Left   Dist [place] 20/400 20/100   Dist ph [place] NI 20/100 +       Tonometry (Tonopen, 9:21 AM)      Right Left   Pressure 11 16       Pupils      Pupils Dark Light Shape React APD   Right PERRL 2 2 Round Minimal None   Left PERRL 2 2 Round Minimal None       Visual Fields (Counting fingers)      Left Right   Restrictions Total superior temporal, inferior temporal, superior nasal, inferior nasal deficiencies Total superior temporal, inferior temporal, superior nasal, inferior nasal deficiencies       Neuro/Psych    Oriented x3: Yes   Mood/Affect: Normal       Dilation    Both eyes: 1.0% Mydriacyl, 2.5% Phenylephrine @ 9:21 AM        Slit Lamp and Fundus Exam    External Exam      Right Left   External Normal Normal       Slit Lamp Exam      Right Left   Lids/Lashes  Normal   Conjunctiva/Sclera  White and quiet,, good coverage of tube   Cornea  Clear   Anterior Chamber  Tube shunt enters at 2 o'clock position into anterior chamber   Iris  Round and reactive   Lens  Posterior chamber intraocular lens   Anterior Vitreous  1+ Cells       Fundus Exam      Right Left   Posterior Vitreous   1+, Vitreous hemorrhage   Disc  Normal   C/D Ratio  0.3   Macula  Attached, no details   Vessels  Proliferative diabetic retinopathy, no obvious new neovascularization   Periphery  Completely attached with good PRP moderate haze  IMAGING AND PROCEDURES  Imaging and Procedures for 12/15/19  Intravitreal Injection, Pharmacologic Agent - OS - Left Eye       Time Out 12/15/2019. 10:31 AM. Confirmed correct  patient, procedure, site, and patient consented.   Anesthesia Topical anesthesia was used. Anesthetic medications included Akten 3.5%.   Procedure Preparation included Ofloxacin , 10% betadine to eyelids, 5% betadine to ocular surface. A 30 gauge needle was used.   Injection:  1.25 mg Bevacizumab (AVASTIN) SOLN   NDC: [address]-3, Lot: [id number]   Route: Intravitreal, Site: Left Eye, Waste: 0 mg  Post-op Post injection exam found visual acuity of at least counting fingers. The patient tolerated the procedure well. There were no complications. The patient received written and verbal post procedure care education. Post injection medications were not given.                 ASSESSMENT/PLAN:  Proliferative diabetic retinopathy of left eye with macular edema associated with type 2 diabetes mellitus ([place]) The nature of regressed proliferative diabetic retinopathy was discussed with the patient. The patient was advised to maintain good glucose, blood pressure, monitor kidney function and serum lipid control as advised by personal physician. Rare risk for reactivation of progression exist with untreated severe anemia, untreated renal failure, untreated heart failure, and smoking. Complete avoidance of smoking was recommended. The chance of recurrent proliferative diabetic retinopathy was discussed as well as the chance of vitreous hemorrhage for which further treatments may be necessary.  OS with a past history of active PDR, likely still quiesced sent and patient has had mild vitreous hemorrhage on the basis of old vascular remnant with minor vitreous hemorrhage.  We will nonetheless treat with intravitreal Avastin to hasten the clearance of the vitreous hemorrhage.      ICD-10-CM   1. Proliferative diabetic retinopathy of left eye with macular edema associated with type 2 diabetes mellitus (HCC)  W58.0998 Intravitreal Injection, Pharmacologic Agent - OS - Left Eye    Bevacizumab (AVASTIN)  SOLN 1.25 mg  2. Vitreous hemorrhage of left eye (HCC)  H43.12 Intravitreal Injection, Pharmacologic Agent - OS - Left Eye    Bevacizumab (AVASTIN) SOLN 1.25 mg  3. Secondary glaucoma due to combination mechanisms, left, severe stage  H40.52X3   4. Neovascular glaucoma of left eye, severe stage  H40.52X3     1.  Intravitreal Avastin OS today in order to facilitate rapid clearance of vitreous hemorrhage OS in a vitrectomized eye with previous quiesced sent proliferative diabetic retinopathy.  2.  If visual acuity declines or changes patient is to contact the office promptly.  3.  Ophthalmic Meds Ordered this visit:  Meds ordered this encounter  Medications  . Bevacizumab (AVASTIN) SOLN 1.25 mg       Return in about 5 weeks (around 01/19/2020) for dilate, OS, AVASTIN OCT.  There are no Patient Instructions on file for this visit.   Explained the diagnoses, plan, and follow up with the patient and they expressed understanding.  Patient expressed understanding of the importance of proper follow up care.   Clent Demark Pauline Trainer M.D. Diseases & Surgery of the Retina and Vitreous Retina [facility] Diabetic [place] 12/15/19     Abbreviations: M myopia (nearsighted); A astigmatism; H hyperopia (farsighted); P presbyopia; Mrx spectacle prescription;  CTL contact lenses; OD right eye; OS left eye; OU both eyes  XT exotropia; ET esotropia; PEK punctate epithelial keratitis; PEE punctate epithelial erosions; DES dry eye syndrome; MGD meibomian gland dysfunction; ATs artificial tears; PFAT's  preservative free artificial tears; [place] nuclear sclerotic cataract; PSC posterior subcapsular cataract; ERM epi-retinal membrane; PVD posterior vitreous detachment; RD retinal detachment; DM diabetes mellitus; DR diabetic retinopathy; NPDR non-proliferative diabetic retinopathy; PDR proliferative diabetic retinopathy; CSME clinically significant macular edema; DME diabetic macular edema; dbh dot blot hemorrhages; CWS  cotton wool spot; POAG primary open angle glaucoma; C/D cup-to-disc ratio; HVF humphrey visual field; GVF goldmann visual field; OCT optical coherence tomography; IOP intraocular pressure; BRVO Branch retinal vein occlusion; CRVO central retinal vein occlusion; CRAO central retinal artery occlusion; BRAO branch retinal artery occlusion; RT retinal tear; SB scleral buckle; PPV pars plana vitrectomy; VH Vitreous hemorrhage; PRP panretinal laser photocoagulation; IVK intravitreal kenalog; VMT vitreomacular traction; MH Macular hole;  NVD neovascularization of the disc; NVE neovascularization elsewhere; AREDS age related eye disease study; ARMD age related macular degeneration; POAG primary open angle glaucoma; EBMD epithelial/anterior basement membrane dystrophy; ACIOL anterior chamber intraocular lens; IOL intraocular lens; PCIOL posterior chamber intraocular lens; Phaco/IOL phacoemulsification with intraocular lens placement; [place] photorefractive keratectomy; LASIK laser assisted in situ keratomileusis; HTN hypertension; DM diabetes mellitus; COPD chronic obstructive pulmonary disease

## 2019-12-15 NOTE — Assessment & Plan Note (Signed)
The nature of regressed proliferative diabetic retinopathy was discussed with the patient. The patient was advised to maintain good glucose, blood pressure, monitor kidney function and serum lipid control as advised by personal physician. Rare risk for reactivation of progression exist with untreated severe anemia, untreated renal failure, untreated heart failure, and smoking. Complete avoidance of smoking was recommended. The chance of recurrent proliferative diabetic retinopathy was discussed as well as the chance of vitreous hemorrhage for which further treatments may be necessary.  OS with a past history of active PDR, likely still quiesced sent and patient has had mild vitreous hemorrhage on the basis of old vascular remnant with minor vitreous hemorrhage.  We will nonetheless treat with intravitreal Avastin to hasten the clearance of the vitreous hemorrhage.

## 2019-12-16 DIAGNOSIS — Z992 Dependence on renal dialysis: Secondary | ICD-10-CM | POA: Diagnosis not present

## 2019-12-16 DIAGNOSIS — N186 End stage renal disease: Secondary | ICD-10-CM | POA: Diagnosis not present

## 2019-12-19 DIAGNOSIS — Z992 Dependence on renal dialysis: Secondary | ICD-10-CM | POA: Diagnosis not present

## 2019-12-19 DIAGNOSIS — N186 End stage renal disease: Secondary | ICD-10-CM | POA: Diagnosis not present

## 2019-12-20 DIAGNOSIS — E782 Mixed hyperlipidemia: Secondary | ICD-10-CM | POA: Diagnosis not present

## 2019-12-20 DIAGNOSIS — E1122 Type 2 diabetes mellitus with diabetic chronic kidney disease: Secondary | ICD-10-CM | POA: Diagnosis not present

## 2019-12-20 DIAGNOSIS — N183 Chronic kidney disease, stage 3 unspecified: Secondary | ICD-10-CM | POA: Diagnosis not present

## 2019-12-20 DIAGNOSIS — I10 Essential (primary) hypertension: Secondary | ICD-10-CM | POA: Diagnosis not present

## 2019-12-20 DIAGNOSIS — I129 Hypertensive chronic kidney disease with stage 1 through stage 4 chronic kidney disease, or unspecified chronic kidney disease: Secondary | ICD-10-CM | POA: Diagnosis not present

## 2019-12-21 DIAGNOSIS — Z992 Dependence on renal dialysis: Secondary | ICD-10-CM | POA: Diagnosis not present

## 2019-12-21 DIAGNOSIS — N186 End stage renal disease: Secondary | ICD-10-CM | POA: Diagnosis not present

## 2019-12-22 ENCOUNTER — Encounter (INDEPENDENT_AMBULATORY_CARE_PROVIDER_SITE_OTHER): Payer: Medicare HMO

## 2019-12-22 ENCOUNTER — Ambulatory Visit (INDEPENDENT_AMBULATORY_CARE_PROVIDER_SITE_OTHER): Payer: Medicare HMO | Admitting: Vascular Surgery

## 2019-12-23 DIAGNOSIS — N186 End stage renal disease: Secondary | ICD-10-CM | POA: Diagnosis not present

## 2019-12-23 DIAGNOSIS — Z992 Dependence on renal dialysis: Secondary | ICD-10-CM | POA: Diagnosis not present

## 2019-12-26 DIAGNOSIS — N186 End stage renal disease: Secondary | ICD-10-CM | POA: Diagnosis not present

## 2019-12-26 DIAGNOSIS — Z992 Dependence on renal dialysis: Secondary | ICD-10-CM | POA: Diagnosis not present

## 2019-12-28 DIAGNOSIS — Z992 Dependence on renal dialysis: Secondary | ICD-10-CM | POA: Diagnosis not present

## 2019-12-28 DIAGNOSIS — N186 End stage renal disease: Secondary | ICD-10-CM | POA: Diagnosis not present

## 2019-12-29 ENCOUNTER — Ambulatory Visit (INDEPENDENT_AMBULATORY_CARE_PROVIDER_SITE_OTHER): Payer: Medicare HMO | Admitting: Vascular Surgery

## 2019-12-29 ENCOUNTER — Encounter (INDEPENDENT_AMBULATORY_CARE_PROVIDER_SITE_OTHER): Payer: Medicare HMO

## 2019-12-30 DIAGNOSIS — N186 End stage renal disease: Secondary | ICD-10-CM | POA: Diagnosis not present

## 2019-12-30 DIAGNOSIS — Z992 Dependence on renal dialysis: Secondary | ICD-10-CM | POA: Diagnosis not present

## 2020-01-02 DIAGNOSIS — N186 End stage renal disease: Secondary | ICD-10-CM | POA: Diagnosis not present

## 2020-01-02 DIAGNOSIS — Z992 Dependence on renal dialysis: Secondary | ICD-10-CM | POA: Diagnosis not present

## 2020-01-04 ENCOUNTER — Other Ambulatory Visit: Payer: Self-pay | Admitting: Gastroenterology

## 2020-01-04 ENCOUNTER — Encounter (INDEPENDENT_AMBULATORY_CARE_PROVIDER_SITE_OTHER): Payer: Medicare HMO | Admitting: Ophthalmology

## 2020-01-04 DIAGNOSIS — K3184 Gastroparesis: Secondary | ICD-10-CM

## 2020-01-04 DIAGNOSIS — Z992 Dependence on renal dialysis: Secondary | ICD-10-CM | POA: Diagnosis not present

## 2020-01-04 DIAGNOSIS — N186 End stage renal disease: Secondary | ICD-10-CM | POA: Diagnosis not present

## 2020-01-06 DIAGNOSIS — Z992 Dependence on renal dialysis: Secondary | ICD-10-CM | POA: Diagnosis not present

## 2020-01-06 DIAGNOSIS — N186 End stage renal disease: Secondary | ICD-10-CM | POA: Diagnosis not present

## 2020-01-09 DIAGNOSIS — N186 End stage renal disease: Secondary | ICD-10-CM | POA: Diagnosis not present

## 2020-01-09 DIAGNOSIS — Z992 Dependence on renal dialysis: Secondary | ICD-10-CM | POA: Diagnosis not present

## 2020-01-10 DIAGNOSIS — N183 Chronic kidney disease, stage 3 unspecified: Secondary | ICD-10-CM | POA: Diagnosis not present

## 2020-01-10 DIAGNOSIS — E1122 Type 2 diabetes mellitus with diabetic chronic kidney disease: Secondary | ICD-10-CM | POA: Diagnosis not present

## 2020-01-10 DIAGNOSIS — E782 Mixed hyperlipidemia: Secondary | ICD-10-CM | POA: Diagnosis not present

## 2020-01-10 DIAGNOSIS — I10 Essential (primary) hypertension: Secondary | ICD-10-CM | POA: Diagnosis not present

## 2020-01-11 DIAGNOSIS — N186 End stage renal disease: Secondary | ICD-10-CM | POA: Diagnosis not present

## 2020-01-11 DIAGNOSIS — Z992 Dependence on renal dialysis: Secondary | ICD-10-CM | POA: Diagnosis not present

## 2020-01-13 DIAGNOSIS — N186 End stage renal disease: Secondary | ICD-10-CM | POA: Diagnosis not present

## 2020-01-13 DIAGNOSIS — Z992 Dependence on renal dialysis: Secondary | ICD-10-CM | POA: Diagnosis not present

## 2020-01-16 DIAGNOSIS — Z992 Dependence on renal dialysis: Secondary | ICD-10-CM | POA: Diagnosis not present

## 2020-01-16 DIAGNOSIS — N186 End stage renal disease: Secondary | ICD-10-CM | POA: Diagnosis not present

## 2020-01-18 DIAGNOSIS — Z992 Dependence on renal dialysis: Secondary | ICD-10-CM | POA: Diagnosis not present

## 2020-01-18 DIAGNOSIS — N186 End stage renal disease: Secondary | ICD-10-CM | POA: Diagnosis not present

## 2020-01-19 ENCOUNTER — Encounter (INDEPENDENT_AMBULATORY_CARE_PROVIDER_SITE_OTHER): Payer: Self-pay | Admitting: Ophthalmology

## 2020-01-19 ENCOUNTER — Other Ambulatory Visit: Payer: Self-pay

## 2020-01-19 ENCOUNTER — Ambulatory Visit (INDEPENDENT_AMBULATORY_CARE_PROVIDER_SITE_OTHER): Payer: Medicare HMO | Admitting: Ophthalmology

## 2020-01-19 DIAGNOSIS — E113512 Type 2 diabetes mellitus with proliferative diabetic retinopathy with macular edema, left eye: Secondary | ICD-10-CM

## 2020-01-19 DIAGNOSIS — H4312 Vitreous hemorrhage, left eye: Secondary | ICD-10-CM

## 2020-01-19 MED ORDER — BEVACIZUMAB CHEMO INJECTION 1.25MG/0.05ML SYRINGE FOR KALEIDOSCOPE
1.2500 mg | INTRAVITREAL | Status: AC | PRN
  Administered 2020-01-19: 1.25 mg via INTRAVITREAL

## 2020-01-19 NOTE — Assessment & Plan Note (Signed)
This is clearing after recent institution of treatment for proliferative diabetic retinopathy, with CSME

## 2020-01-19 NOTE — Progress Notes (Signed)
01/19/2020     CHIEF COMPLAINT Patient presents for Retina Follow Up   HISTORY OF PRESENT ILLNESS: Timothy Clark is a 49 y.o. male who presents to the clinic today for:   HPI    Retina Follow Up    Patient presents with  Diabetic Retinopathy.  In left eye.  Severity is moderate.  Duration of 5 weeks.  Since onset it is stable.  I, the attending physician,  performed the HPI with the patient and updated documentation appropriately.          Comments    5 Week NPDR f\u OS. Possible Avastin OS. OCT  Pt states OS vision is still blurry.  BGL: 123       Last edited by Tilda Franco on 01/19/2020 10:27 AM. (History)      Referring physician: [name], MD [place],  [place] [address]  HISTORICAL INFORMATION:   Selected notes from the MEDICAL RECORD NUMBER    Lab Results  Component Value Date   HGBA1C 7.1 (H) 10/25/2018     CURRENT MEDICATIONS: Current Outpatient Medications (Ophthalmic Drugs)  Medication Sig  . brimonidine (ALPHAGAN) 0.15 % ophthalmic solution Place 1 drop into both eyes in the morning and at bedtime.   . timolol (TIMOPTIC) 0.5 % ophthalmic solution Place 1 drop into both eyes 2 (two) times daily.   No current facility-administered medications for this visit. (Ophthalmic Drugs)   Current Outpatient Medications (Other)  Medication Sig  . amLODipine (NORVASC) 5 MG tablet Take 5 mg by mouth daily.  [name] atorvastatin (LIPITOR) 10 MG tablet Take 10 mg by mouth at bedtime.   . carvedilol (COREG) 25 MG tablet Take 25 mg by mouth 2 (two) times daily with a meal.  . cyanocobalamin (,VITAMIN B-12,) 1000 MCG/ML injection Inject 1,000 mcg into the muscle every 30 (thirty) days.   . folic acid (FOLVITE) 1 MG tablet Take 1 mg by mouth daily.  . metoCLOPramide (REGLAN) 5 MG tablet TAKE ONE TABLET BY MOUTH BEFORE BREAKFAST  . NOVOLOG MIX 70/30 FLEXPEN (70-30) 100 UNIT/ML FlexPen 38-50 Units by Subconjunctival route See admin instructions.  Inject 50 units subcutaneously in the morning & inject 30 units subcutaneously in the evening.  . pantoprazole (PROTONIX) 40 MG tablet Take 1 tablet (40 mg total) by mouth 2 (two) times daily before a meal.  . promethazine (PHENERGAN) 25 MG tablet Take 12.5 mg by mouth every 6 (six) hours as needed for nausea or vomiting.   . sertraline (ZOLOFT) 50 MG tablet Take 50 mg by mouth daily.  . sitaGLIPtin (JANUVIA) 25 MG tablet Take 25 mg by mouth daily.   . sucralfate (CARAFATE) 1 g tablet Take 1 g by mouth 4 (four) times daily.   [name] torsemide (DEMADEX) 20 MG tablet Take 40 mg by mouth daily.    No current facility-administered medications for this visit. (Other)      REVIEW OF SYSTEMS: ROS    Positive for: Endocrine   Last edited by Tilda Franco on 01/19/2020 10:27 AM. (History)       ALLERGIES Allergies  Allergen Reactions  . Donnatal [Phenobarbital-Belladonna Alk] Anaphylaxis and Other (See Comments)    Reaction to GI cocktail  . Fish Allergy Anaphylaxis, Hives and Rash  . Haldol [Haloperidol Lactate] Other (See Comments)    Chest Pain  . Maalox [Calcium Carbonate Antacid] Anaphylaxis and Other (See Comments)    Reaction to GI cocktail  . Shellfish Allergy Hives  Per patient: Patient stated he breaks out in hives when eating shellfish  . Marinol [Dronabinol] Nausea And Vomiting and Other (See Comments)    Caused worsening vomiting.   . Aspirin Hives and Itching  . Bactrim [Sulfamethoxazole-Trimethoprim] Itching  . Keflex [Cephalexin] Hives, Itching and Nausea And Vomiting  . Penicillins Hives, Itching and Other (See Comments)    Has patient had a PCN reaction causing immediate rash, facial/tongue/throat swelling, SOB or lightheadedness with hypotension: yes Has patient had a PCN reaction causing severe rash involving mucus membranes or skin necrosis: yes Has patient had a PCN reaction that required hospitalization no Has patient had a PCN reaction occurring within the  last 10 years: yes If all of the above answers are "NO", then may proceed with Cephalospor    PAST MEDICAL HISTORY Past Medical History:  Diagnosis Date  . Anxiety   . CHF (congestive heart failure) (HCC)    diastolic dysfunction  . Chronic abdominal pain   . COVID-19 07/2019  . Depression   . Esophagitis   . ESRD on hemodialysis ([place])   . Eye hemorrhage   . Gastritis   . Gastroparesis   . GERD (gastroesophageal reflux disease)   . Glaucoma   . Helicobacter pylori (H. pylori) infection 2016   ERADICATION DOCUMENTED VIA STOOL TEST in AUG 2016 at [facility]  . Hiccups   . Hypertension   . Nausea and vomiting    chronic, recurrent  . Neuropathy   . Renal insufficiency   . Type 2 diabetes mellitus with complications (HCC)    diagnosed around age 77   Past Surgical History:  Procedure Laterality Date  . AMPUTATION TOE Right    great toe  . AV FISTULA INSERTION W/ RF MAGNETIC GUIDANCE Left 04/06/2019   Procedure: AV FISTULA INSERTION W/RF MAGNETIC GUIDANCE;  Surgeon: Katha Cabal, MD;  Location: [facility];  Service: Cardiovascular;  Laterality: Left;  . AV FISTULA PLACEMENT Left 05/06/2019   Procedure: INSERTION OF ARTERIOVENOUS (AV) GORE-TEX GRAFT ARM ( FOREARM LOOP );  Surgeon: Katha Cabal, MD;  Location: [facility];  Service: Vascular;  Laterality: Left;  . CATARACT EXTRACTION W/PHACO Left 05/19/2016   Procedure: CATARACT EXTRACTION PHACO AND INTRAOCULAR LENS PLACEMENT LEFT EYE;  Surgeon: Tonny Branch, MD;  Location: [facility];  Service: Ophthalmology;  Laterality: Left;  CDE: 7.30  . CATARACT EXTRACTION W/PHACO Right 06/23/2016   Procedure: CATARACT EXTRACTION PHACO AND INTRAOCULAR LENS PLACEMENT RIGHT EYE CDE=9.87;  Surgeon: Tonny Branch, MD;  Location: [facility];  Service: Ophthalmology;  Laterality: Right;  right  . DIALYSIS/PERMA CATHETER REMOVAL N/A 07/19/2019   Procedure: DIALYSIS/PERMA CATHETER REMOVAL;  Surgeon: Katha Cabal, MD;  Location: [facility];  Service: Cardiovascular;  Laterality: N/A;  . ESOPHAGOGASTRODUODENOSCOPY  2015   Dr. Britta Mccreedy  . ESOPHAGOGASTRODUODENOSCOPY N/A 10/26/2014   RMR: Distal esophagititis-likely reflux related although an element of pill induced injuruy no exclueded  status post biopsy. Diffusely abnormal gastric mucosa of uncertain signigicane -status post gastric biopsy. Focal area of excoriation in the cardia most consistant with a trauma of heaving.   . ESOPHAGOGASTRODUODENOSCOPY  08/2015   [facility]: mild esophagitis and old gastric contents  . EYE SURGERY  2015   stent placed to left eye  . EYE SURGERY  06/2019   per patient- to remove scar tissue  . INCISION AND DRAINAGE OF WOUND Right 07/16/2017   Procedure: IRRIGATION AND DEBRIDEMENT OF SOFT TISSUE OF ULCERATION RIGHT FOOT;  Surgeon: Caprice Beaver,  DPM;  Location: [facility];  Service: Podiatry;  Laterality: Right;  . PERIPHERAL VASCULAR THROMBECTOMY Left 09/27/2019   Procedure: PERIPHERAL VASCULAR THROMBECTOMY;  Surgeon: Katha Cabal, MD;  Location: [facility];  Service: Cardiovascular;  Laterality: Left;  . PERIPHERAL VASCULAR THROMBECTOMY Left 10/27/2019   Procedure: PERIPHERAL VASCULAR THROMBECTOMY;  Surgeon: Algernon Huxley, MD;  Location: [facility];  Service: Cardiovascular;  Laterality: Left;  . PERIPHERAL VASCULAR THROMBECTOMY Left 12/13/2019   Procedure: PERIPHERAL VASCULAR THROMBECTOMY;  Surgeon: Katha Cabal, MD;  Location: [facility];  Service: Cardiovascular;  Laterality: Left;    FAMILY HISTORY Family History  Problem Relation Age of Onset  . Ovarian cancer Mother   . Heart attack Father   . Colon polyps Father   . Cervical cancer Sister   . Diabetes Sister   . Colon cancer Neg Hx     SOCIAL HISTORY Social History   Tobacco Use  . Smoking status: Never Smoker  . Smokeless tobacco: Never Used  Vaping Use  . Vaping Use: Never used  Substance Use Topics  . Alcohol use: No     Alcohol/week: 0.0 standard drinks  . Drug use: No         OPHTHALMIC EXAM:  Base Eye Exam    Visual Acuity (Snellen - Linear)      Right Left   Dist [place] 20/400 20/60   Dist ph [place] NI NI       Tonometry (Tonopen, 10:32 AM)      Right Left   Pressure 8 8       Pupils      Dark Light Shape React APD   Right 1.5 1.5 Round Minimal None   Left 1.5 1.5 Round Minimal None       Visual Fields (Counting fingers)      Left Right   Restrictions Total superior temporal, inferior temporal, superior nasal, inferior nasal deficiencies Total superior temporal, inferior temporal, superior nasal deficiencies       Neuro/Psych    Oriented x3: Yes   Mood/Affect: Normal       Dilation    Left eye: 1.0% Mydriacyl, 2.5% Phenylephrine @ 10:32 AM        Slit Lamp and Fundus Exam    External Exam      Right Left   External Normal Normal       Slit Lamp Exam      Right Left   Lids/Lashes  Normal   Conjunctiva/Sclera  White and quiet,, good coverage of tube   Cornea  Clear   Anterior Chamber  Tube shunt enters at 2 o'clock position into anterior chamber   [place]ris  Round and reactive   Lens  Posterior chamber intraocular lens   Anterior Vitreous  1+ Cells       Fundus Exam      Right Left   Posterior Vitreous  clearing Vitreous hemorrhage   Disc  2+ Pallor, old FPD stable   C/D Ratio  0.4   Macula  Attached,   Vessels  Proliferative diabetic retinopathy, no obvious new neovascularization   Periphery  Completely attached with good PRP moderate haze          IMAGING AND PROCEDURES  Imaging and Procedures for 01/19/20  OCT, Retina - OU - Both Eyes       Right Eye Quality was poor. Progression has been stable. Findings include outer retinal atrophy, central retinal atrophy, inner retinal atrophy.  Left Eye Quality was borderline. Scan locations included subfoveal. Progression has improved.   Notes OS with much less CSME, some media haze still persist yet visual acuity  is improving status post intravitreal Avastin 1 month ago                ASSESSMENT/PLAN:  Vitreous hemorrhage of left eye (HCC) This is clearing after recent institution of treatment for proliferative diabetic retinopathy, with CSME  Proliferative diabetic retinopathy of left eye with macular edema associated with type 2 diabetes mellitus (HCC) CSME and vision have improved status post intravitreal Avastin 1 month prior.  Will repeat today      ICD-10-CM   1. Proliferative diabetic retinopathy of left eye with macular edema associated with type 2 diabetes mellitus (HCC)  B86.7544 OCT, Retina - OU - Both Eyes  2. Vitreous hemorrhage of left eye (HCC)  H43.12     1.CSME and vision have improved status post intravitreal Avastin 1 month prior.  Will repeat today  2.  3.  Ophthalmic Meds Ordered this visit:  No orders of the defined types were placed in this encounter.      No follow-ups on file.  There are no Patient Instructions on file for this visit.   Explained the diagnoses, plan, and follow up with the patient and they expressed understanding.  Patient expressed understanding of the importance of proper follow up care.   Clent Demark Shaquan Puerta M.D. Diseases & Surgery of the Retina and Vitreous [facility] 01/19/20     Abbreviations: M myopia (nearsighted); A astigmatism; H hyperopia (farsighted); P presbyopia; Mrx spectacle prescription;  CTL contact lenses; OD right eye; OS left eye; OU both eyes  XT exotropia; ET esotropia; PEK punctate epithelial keratitis; PEE punctate epithelial erosions; DES dry eye syndrome; MGD meibomian gland dysfunction; ATs artificial tears; PFAT's preservative free artificial tears; [place] nuclear sclerotic cataract; PSC posterior subcapsular cataract; ERM epi-retinal membrane; PVD posterior vitreous detachment; RD retinal detachment; DM diabetes mellitus; DR diabetic retinopathy; NPDR non-proliferative diabetic retinopathy;  PDR proliferative diabetic retinopathy; CSME clinically significant macular edema; DME diabetic macular edema; dbh dot blot hemorrhages; CWS cotton wool spot; POAG primary open angle glaucoma; C/D cup-to-disc ratio; HVF humphrey visual field; GVF goldmann visual field; OCT optical coherence tomography; IOP intraocular pressure; BRVO Branch retinal vein occlusion; CRVO central retinal vein occlusion; CRAO central retinal artery occlusion; BRAO branch retinal artery occlusion; RT retinal tear; SB scleral buckle; PPV pars plana vitrectomy; VH Vitreous hemorrhage; PRP panretinal laser photocoagulation; IVK intravitreal kenalog; VMT vitreomacular traction; MH Macular hole;  NVD neovascularization of the disc; NVE neovascularization elsewhere; AREDS age related eye disease study; ARMD age related macular degeneration; POAG primary open angle glaucoma; EBMD epithelial/anterior basement membrane dystrophy; ACIOL anterior chamber intraocular lens; IOL intraocular lens; PCIOL posterior chamber intraocular lens; Phaco/IOL phacoemulsification with intraocular lens placement; [place] photorefractive keratectomy; LASIK laser assisted in situ keratomileusis; HTN hypertension; DM diabetes mellitus; COPD chronic obstructive pulmonary disease

## 2020-01-19 NOTE — Assessment & Plan Note (Signed)
CSME and vision have improved status post intravitreal Avastin 1 month prior.  Will repeat today

## 2020-01-20 DIAGNOSIS — Z992 Dependence on renal dialysis: Secondary | ICD-10-CM | POA: Diagnosis not present

## 2020-01-20 DIAGNOSIS — N186 End stage renal disease: Secondary | ICD-10-CM | POA: Diagnosis not present

## 2020-01-23 DIAGNOSIS — Z992 Dependence on renal dialysis: Secondary | ICD-10-CM | POA: Diagnosis not present

## 2020-01-23 DIAGNOSIS — N186 End stage renal disease: Secondary | ICD-10-CM | POA: Diagnosis not present

## 2020-01-25 DIAGNOSIS — N186 End stage renal disease: Secondary | ICD-10-CM | POA: Diagnosis not present

## 2020-01-25 DIAGNOSIS — Z992 Dependence on renal dialysis: Secondary | ICD-10-CM | POA: Diagnosis not present

## 2020-01-26 ENCOUNTER — Other Ambulatory Visit (INDEPENDENT_AMBULATORY_CARE_PROVIDER_SITE_OTHER): Payer: Self-pay | Admitting: Vascular Surgery

## 2020-01-26 ENCOUNTER — Encounter (INDEPENDENT_AMBULATORY_CARE_PROVIDER_SITE_OTHER): Payer: Self-pay

## 2020-01-26 ENCOUNTER — Other Ambulatory Visit: Payer: Self-pay

## 2020-01-26 ENCOUNTER — Ambulatory Visit (INDEPENDENT_AMBULATORY_CARE_PROVIDER_SITE_OTHER): Payer: Medicare HMO | Admitting: Vascular Surgery

## 2020-01-26 ENCOUNTER — Ambulatory Visit (INDEPENDENT_AMBULATORY_CARE_PROVIDER_SITE_OTHER): Payer: Medicare HMO

## 2020-01-26 ENCOUNTER — Encounter (INDEPENDENT_AMBULATORY_CARE_PROVIDER_SITE_OTHER): Payer: Self-pay | Admitting: Vascular Surgery

## 2020-01-26 VITALS — BP 123/78 | HR 72 | Ht 71.0 in | Wt 284.0 lb

## 2020-01-26 DIAGNOSIS — IMO0002 Reserved for concepts with insufficient information to code with codable children: Secondary | ICD-10-CM

## 2020-01-26 DIAGNOSIS — E118 Type 2 diabetes mellitus with unspecified complications: Secondary | ICD-10-CM | POA: Diagnosis not present

## 2020-01-26 DIAGNOSIS — I739 Peripheral vascular disease, unspecified: Secondary | ICD-10-CM

## 2020-01-26 DIAGNOSIS — I10 Essential (primary) hypertension: Secondary | ICD-10-CM

## 2020-01-26 DIAGNOSIS — T829XXS Unspecified complication of cardiac and vascular prosthetic device, implant and graft, sequela: Secondary | ICD-10-CM

## 2020-01-26 DIAGNOSIS — E1165 Type 2 diabetes mellitus with hyperglycemia: Secondary | ICD-10-CM | POA: Diagnosis not present

## 2020-01-26 DIAGNOSIS — Z992 Dependence on renal dialysis: Secondary | ICD-10-CM

## 2020-01-26 DIAGNOSIS — N186 End stage renal disease: Secondary | ICD-10-CM

## 2020-01-26 DIAGNOSIS — K219 Gastro-esophageal reflux disease without esophagitis: Secondary | ICD-10-CM | POA: Diagnosis not present

## 2020-01-26 DIAGNOSIS — Z9862 Peripheral vascular angioplasty status: Secondary | ICD-10-CM

## 2020-01-27 DIAGNOSIS — N186 End stage renal disease: Secondary | ICD-10-CM | POA: Diagnosis not present

## 2020-01-27 DIAGNOSIS — Z992 Dependence on renal dialysis: Secondary | ICD-10-CM | POA: Diagnosis not present

## 2020-01-29 ENCOUNTER — Encounter (INDEPENDENT_AMBULATORY_CARE_PROVIDER_SITE_OTHER): Payer: Self-pay | Admitting: Vascular Surgery

## 2020-01-29 DIAGNOSIS — T829XXA Unspecified complication of cardiac and vascular prosthetic device, implant and graft, initial encounter: Secondary | ICD-10-CM | POA: Insufficient documentation

## 2020-01-29 NOTE — Progress Notes (Signed)
MRN : [id number]  Timothy Clark is a 49 y.o. ([birth year]) male who presents with chief complaint of No chief complaint on file. [name]  History of Present Illness:    The patient is seen for evaluation of dialysis access.  The patient has a history of multiple failed accesses.  There have been access in the left forearm which is non-salvageable.   Current access is via a catheter which is functioning poorly.  There have been several episodes of catheter infection.  The patient denies amaurosis fugax or recent TIA symptoms. There are no recent neurological changes noted. The patient denies claudication symptoms or rest pain symptoms. The patient denies history of DVT, PE or superficial thrombophlebitis. The patient denies recent episodes of angina or shortness of breath.    No outpatient medications have been marked as taking for the 01/26/20 encounter (Appointment) with Delana Meyer, Dolores Lory, MD.    Past Medical History:  Diagnosis Date  . Anxiety   . CHF (congestive heart failure) (HCC)    diastolic dysfunction  . Chronic abdominal pain   . COVID-19 07/2019  . Depression   . Esophagitis   . ESRD on hemodialysis ([place])   . Eye hemorrhage   . Gastritis   . Gastroparesis   . GERD (gastroesophageal reflux disease)   . Glaucoma   . Helicobacter pylori (H. pylori) infection 2016   ERADICATION DOCUMENTED VIA STOOL TEST in AUG 2016 at [place]  . Hiccups   . Hypertension   . Nausea and vomiting    chronic, recurrent  . Neuropathy   . Renal insufficiency   . Type 2 diabetes mellitus with complications (HCC)    diagnosed around age 3    Past Surgical History:  Procedure Laterality Date  . AMPUTATION TOE Right    great toe  . AV FISTULA INSERTION W/ RF MAGNETIC GUIDANCE Left 04/06/2019   Procedure: AV FISTULA INSERTION W/RF MAGNETIC GUIDANCE;  Surgeon: Katha Cabal, MD;  Location: [facility];  Service: Cardiovascular;  Laterality: Left;  . AV FISTULA PLACEMENT  Left 05/06/2019   Procedure: INSERTION OF ARTERIOVENOUS (AV) GORE-TEX GRAFT ARM ( FOREARM LOOP );  Surgeon: Katha Cabal, MD;  Location: [facility];  Service: Vascular;  Laterality: Left;  . CATARACT EXTRACTION W/PHACO Left 05/19/2016   Procedure: CATARACT EXTRACTION PHACO AND INTRAOCULAR LENS PLACEMENT LEFT EYE;  Surgeon: Tonny Branch, MD;  Location: [facility];  Service: Ophthalmology;  Laterality: Left;  CDE: 7.30  . CATARACT EXTRACTION W/PHACO Right 06/23/2016   Procedure: CATARACT EXTRACTION PHACO AND INTRAOCULAR LENS PLACEMENT RIGHT EYE CDE=9.87;  Surgeon: Tonny Branch, MD;  Location: [facility];  Service: Ophthalmology;  Laterality: Right;  right  . DIALYSIS/PERMA CATHETER REMOVAL N/A 07/19/2019   Procedure: DIALYSIS/PERMA CATHETER REMOVAL;  Surgeon: Katha Cabal, MD;  Location: [facility];  Service: Cardiovascular;  Laterality: N/A;  . ESOPHAGOGASTRODUODENOSCOPY  2015   Dr. Britta Mccreedy  . ESOPHAGOGASTRODUODENOSCOPY N/A 10/26/2014   RMR: Distal esophagititis-likely reflux related although an element of pill induced injuruy no exclueded  status post biopsy. Diffusely abnormal gastric mucosa of uncertain signigicane -status post gastric biopsy. Focal area of excoriation in the cardia most consistant with a trauma of heaving.   . ESOPHAGOGASTRODUODENOSCOPY  08/2015   [facility]: mild esophagitis and old gastric contents  . EYE SURGERY  2015   stent placed to left eye  . EYE SURGERY  06/2019   per patient- to remove scar tissue  . INCISION AND DRAINAGE OF  WOUND Right 07/16/2017   Procedure: IRRIGATION AND DEBRIDEMENT OF SOFT TISSUE OF ULCERATION RIGHT FOOT;  Surgeon: Caprice Beaver, DPM;  Location: [facility];  Service: Podiatry;  Laterality: Right;  . PERIPHERAL VASCULAR THROMBECTOMY Left 09/27/2019   Procedure: PERIPHERAL VASCULAR THROMBECTOMY;  Surgeon: Katha Cabal, MD;  Location: [facility];  Service: Cardiovascular;  Laterality: Left;  . PERIPHERAL VASCULAR THROMBECTOMY Left  10/27/2019   Procedure: PERIPHERAL VASCULAR THROMBECTOMY;  Surgeon: Algernon Huxley, MD;  Location: [facility];  Service: Cardiovascular;  Laterality: Left;  . PERIPHERAL VASCULAR THROMBECTOMY Left 12/13/2019   Procedure: PERIPHERAL VASCULAR THROMBECTOMY;  Surgeon: Katha Cabal, MD;  Location: [facility];  Service: Cardiovascular;  Laterality: Left;    Social History Social History   Tobacco Use  . Smoking status: Never Smoker  . Smokeless tobacco: Never Used  Vaping Use  . Vaping Use: Never used  Substance Use Topics  . Alcohol use: No    Alcohol/week: 0.0 standard drinks  . Drug use: No    Family History Family History  Problem Relation Age of Onset  . Ovarian cancer Mother   . Heart attack Father   . Colon polyps Father   . Cervical cancer Sister   . Diabetes Sister   . Colon cancer Neg Hx     Allergies  Allergen Reactions  . Donnatal [Phenobarbital-Belladonna Alk] Anaphylaxis and Other (See Comments)    Reaction to GI cocktail  . Fish Allergy Anaphylaxis, Hives and Rash  . Haldol [Haloperidol Lactate] Other (See Comments)    Chest Pain  . Maalox [Calcium Carbonate Antacid] Anaphylaxis and Other (See Comments)    Reaction to GI cocktail  . Shellfish Allergy Hives    Per patient: Patient stated he breaks out in hives when eating shellfish  . Marinol [Dronabinol] Nausea And Vomiting and Other (See Comments)    Caused worsening vomiting.   . Aspirin Hives and Itching  . Bactrim [Sulfamethoxazole-Trimethoprim] Itching  . Keflex [Cephalexin] Hives, Itching and Nausea And Vomiting  . Penicillins Hives, Itching and Other (See Comments)    Has patient had a PCN reaction causing immediate rash, facial/tongue/throat swelling, SOB or lightheadedness with hypotension: yes Has patient had a PCN reaction causing severe rash involving mucus membranes or skin necrosis: yes Has patient had a PCN reaction that required hospitalization no Has patient had a PCN  reaction occurring within the last 10 years: yes If all of the above answers are "NO", then may proceed with Cephalospor     REVIEW OF SYSTEMS (Negative unless checked)  Constitutional: _0 Weight loss  _1 Fever  _2 Chills Cardiac: _3 Chest pain   _4 Chest pressure   _5 Palpitations   _6 Shortness of breath when laying flat   _7 Shortness of breath with exertion. Vascular:  _8 Pain in legs with walking   _9 Pain in legs at rest  _10 History of DVT   _11 Phlebitis   _12 Swelling in legs   _13 Varicose veins   _14 Non-healing ulcers Pulmonary:   _15 Uses home oxygen   _16 Productive cough   _17 Hemoptysis   _18 Wheeze  _19 COPD   _20 Asthma Neurologic:  _21 Dizziness   _22 Seizures   _23 History of stroke   _24 History of TIA  _25 Aphasia   _26 Vissual changes   _27 Weakness or numbness in arm   _28 Weakness or numbness in leg Musculoskeletal:   _29 Joint swelling   _30 Joint pain   _31 Low back pain Hematologic:  _32 Easy bruising  _33 Easy bleeding   _34 Hypercoagulable state   _35 Anemic Gastrointestinal:  _36 Diarrhea   _37 Vomiting  _38   Gastroesophageal reflux/heartburn   _0 Difficulty swallowing. Genitourinary:  _1 Chronic kidney disease   _2 Difficult urination  _3 Frequent urination   _4 Blood in urine Skin:  _5 Rashes   _6 Ulcers  Psychological:  _7 History of anxiety   _8  History of major depression.  Physical Examination  There were no vitals filed for this visit. There is no height or weight on file to calculate BMI. Gen: WD/WN, NAD Head: [place]/AT, No temporalis wasting.  Ear/Nose/Throat: Hearing grossly intact, nares w/o erythema or drainage Eyes: PER, EOMI, sclera nonicteric.  Neck: Supple, no large masses.   Pulmonary:  Good air movement, no audible wheezing bilaterally, no use of accessory muscles.  Cardiac: RRR, no JVD Vascular:  Left forearm loop graft no thrill no bruit Vessel Right Left  Radial Palpable Palpable  Brachial Palpable Palpable  Gastrointestinal: Non-distended. No guarding/no peritoneal signs.  Musculoskeletal: M/S 5/5  throughout.  No deformity or atrophy.  Neurologic: CN 2-12 intact. Symmetrical.  Speech is fluent. Motor exam as listed above. Psychiatric: Judgment intact, Mood & affect appropriate for pt's clinical situation. Dermatologic: No rashes or ulcers noted.  No changes consistent with cellulitis.   CBC Lab Results  Component Value Date   WBC 5.3 04/27/2019   HGB 8.8 (L) 05/06/2019   HCT 26.0 (L) 05/06/2019   MCV 95.2 04/27/2019   PLT 123 (L) 04/27/2019    BMET    Component Value Date/Time   NA 138 05/06/2019 1020   K 4.1 05/06/2019 1020   CL 99 05/06/2019 1020   CO2 25 04/27/2019 1110   GLUCOSE 226 (H) 05/06/2019 1020   BUN 37 (H) 05/06/2019 1020   CREATININE 6.50 (H) 05/06/2019 1020   CREATININE 1.20 06/22/2017 1436   CALCIUM 8.7 (L) 04/27/2019 1110   GFRNONAA 11 (L) 04/27/2019 1110   GFRAA 12 (L) 04/27/2019 1110   CrCl cannot be calculated (Patient's most recent lab result is older than the maximum 21 days allowed.).  COAG Lab Results  Component Value Date   INR 1.1 04/27/2019   INR 1.2 03/29/2019   INR 1.1 10/25/2018    Radiology Intravitreal Injection, Pharmacologic Agent - OS - Left Eye  Result Date: 01/19/2020 Time Out 01/19/2020. 11:46 AM. Confirmed correct patient, procedure, site, and patient consented. Anesthesia Topical anesthesia was used. Anesthetic medications included Akten 3.5%. Procedure Preparation included Tobramycin 0.3%, 10% betadine to eyelids, 5% betadine to ocular surface. A 30 gauge needle was used. Injection: 1.25 mg Bevacizumab (AVASTIN) SOLN   NDC: [address]-7, Lot: [id number]   Route: Intravitreal, Site: Left Eye, Waste: 0 mg Post-op Post injection exam found visual acuity of at least counting fingers. The patient tolerated the procedure well. There were no complications. The patient received written and verbal post procedure care education. Post injection medications were not given.   OCT, Retina - OU - Both Eyes  Result Date: 01/19/2020 Right Eye  Quality was poor. Progression has been stable. Findings include outer retinal atrophy, central retinal atrophy, inner retinal atrophy. Left Eye Quality was borderline. Scan locations included subfoveal. Progression has improved. Notes OS with much less CSME, some media haze still persist yet visual acuity is improving status post intravitreal Avastin 1 month ago    Assessment/Plan 1. Complication of vascular access for dialysis, sequela Recommend:  At this time the patient does not have appropriate extremity access for dialysis  Patient should have a left brachial axillary graft created.  The risks, benefits and alternative therapies were reviewed in detail with the patient.  All questions were answered.  The  patient agrees to proceed with surgery.    2. ESRD on dialysis ([place]) At the present time the patient has adequate dialysis access.  Continue hemodialysis as ordered without interruption.  Avoid nephrotoxic medications and dehydration.  Further plans per nephrology  3. Diabetes mellitus type 2 with complications, uncontrolled ([place]) Continue hypoglycemic medications as already ordered, these medications have been reviewed and there are no changes at this time.  Hgb A1C to be monitored as already arranged by primary service   4. Essential hypertension Continue antihypertensive medications as already ordered, these medications have been reviewed and there are no changes at this time.   5. Gastroesophageal reflux disease, unspecified whether esophagitis present Continue PPI as already ordered, this medication has been reviewed and there are no changes at this time.  Avoidence of caffeine and alcohol  Moderate elevation of the head of the bed     [name], MD  01/29/2020 10:38 AM

## 2020-01-30 DIAGNOSIS — N186 End stage renal disease: Secondary | ICD-10-CM | POA: Diagnosis not present

## 2020-01-30 DIAGNOSIS — Z992 Dependence on renal dialysis: Secondary | ICD-10-CM | POA: Diagnosis not present

## 2020-01-31 ENCOUNTER — Telehealth (INDEPENDENT_AMBULATORY_CARE_PROVIDER_SITE_OTHER): Payer: Self-pay

## 2020-01-31 NOTE — Telephone Encounter (Signed)
Spoke with the patient and he is now scheduled with Dr. Delana Meyer for a left brachial axillary graft on 02/17/20. Patient will do a phone call pre-op on 02/07/20 between 1-5 pm and covid testing on 02/16/20 between 8-1 pm at the [facility]. Pre-surgical instructions were discussed and will be mailed to the patient.

## 2020-02-01 DIAGNOSIS — N186 End stage renal disease: Secondary | ICD-10-CM | POA: Diagnosis not present

## 2020-02-01 DIAGNOSIS — Z992 Dependence on renal dialysis: Secondary | ICD-10-CM | POA: Diagnosis not present

## 2020-02-03 DIAGNOSIS — N186 End stage renal disease: Secondary | ICD-10-CM | POA: Diagnosis not present

## 2020-02-03 DIAGNOSIS — Z992 Dependence on renal dialysis: Secondary | ICD-10-CM | POA: Diagnosis not present

## 2020-02-06 ENCOUNTER — Other Ambulatory Visit (INDEPENDENT_AMBULATORY_CARE_PROVIDER_SITE_OTHER): Payer: Self-pay | Admitting: Nurse Practitioner

## 2020-02-06 DIAGNOSIS — E785 Hyperlipidemia, unspecified: Secondary | ICD-10-CM | POA: Diagnosis not present

## 2020-02-06 DIAGNOSIS — N186 End stage renal disease: Secondary | ICD-10-CM | POA: Diagnosis not present

## 2020-02-06 DIAGNOSIS — Z992 Dependence on renal dialysis: Secondary | ICD-10-CM | POA: Diagnosis not present

## 2020-02-06 DIAGNOSIS — E119 Type 2 diabetes mellitus without complications: Secondary | ICD-10-CM | POA: Diagnosis not present

## 2020-02-07 ENCOUNTER — Encounter
Admission: RE | Admit: 2020-02-07 | Discharge: 2020-02-07 | Disposition: A | Discharge status: Home / Self Care | Payer: Medicare HMO | Source: Ambulatory Visit | Attending: Vascular Surgery | Admitting: Vascular Surgery

## 2020-02-07 ENCOUNTER — Other Ambulatory Visit: Payer: Self-pay

## 2020-02-07 HISTORY — DX: Anemia, unspecified: D64.9

## 2020-02-07 NOTE — Patient Instructions (Signed)
Your procedure is scheduled on: 02/17/20 Report to [facility]. To find out your arrival time please call [phone] between 1PM - 3PM on 02/16/20.  Remember: Instructions that are not followed completely may result in serious medical risk, up to and including death, or upon the discretion of your surgeon and anesthesiologist your surgery may need to be rescheduled.     _X__ 1. Do not eat food after midnight the night before your procedure.                 No gum chewing or hard candies. You may drink clear liquids up to 2 hours                 before you are scheduled to arrive for your surgery- DO not drink clear                 liquids within 2 hours of the start of your surgery.                 Clear Liquids include:  water, apple juice without pulp, clear carbohydrate                 drink such as Clearfast or Gatorade, Black Coffee or Tea (Do not add                 anything to coffee or tea). Diabetics water only  __X__2.  On the morning of surgery brush your teeth with toothpaste and water, you                 may rinse your mouth with mouthwash if you wish.  Do not swallow any              toothpaste of mouthwash.     _X__ 3.  No Alcohol for 24 hours before or after surgery.   _X__ 4.  Do Not Smoke or use e-cigarettes For 24 Hours Prior to Your Surgery.                 Do not use any chewable tobacco products for at least 6 hours prior to                 surgery.  ____  5.  Bring all medications with you on the day of surgery if instructed.   __X__  6.  Notify your doctor if there is any change in your medical condition      (cold, fever, infections).     Do not wear jewelry, make-up, hairpins, clips or nail polish. Do not wear lotions, powders, or perfumes.  Do not shave 48 hours prior to surgery. Men may shave face and neck. Do not bring valuables to the hospital.    [facility] [employer] is not responsible for any belongings or  valuables.  Contacts, dentures/partials or body piercings may not be worn into surgery. Bring a case for your contacts, glasses or hearing aids, a denture cup will be supplied. Leave your suitcase in the car. After surgery it may be brought to your room. For patients admitted to the hospital, discharge time is determined by your treatment team.   Patients discharged the day of surgery will not be allowed to drive home.   Please read over the following fact sheets that you were given:   MRSA Information  __X__ Take these medicines the morning of surgery with A SIP OF WATER:  1. amLODipine (NORVASC) 5 MG tablet  2. carvedilol (COREG) 25 MG tablet  3. cloNIDine (CATAPRES) 0.2 MG tablet  4. metoCLOPramide (REGLAN) 5 MG tablet  5. pantoprazole (PROTONIX) 40 MG tablet  6. sertraline (ZOLOFT) 50 MG tablet  ____ Fleet Enema (as directed)   __X__ Use CHG Soap/SAGE wipes as directed  ____ Use inhalers on the day of surgery  ____ Stop metformin/Janumet/Farxiga 2 days prior to surgery    ____ Take 1/2 of usual insulin dose the night before surgery. No insulin the morning          of surgery.   ____ Stop Blood Thinners Coumadin/Plavix/Xarelto/Pleta/Pradaxa/Eliquis/Effient/Aspirin  on   Or contact your Surgeon, Cardiologist or Medical Doctor regarding  ability to stop your blood thinners  __X__ Stop Anti-inflammatories 7 days before surgery such as Advil, Ibuprofen, Motrin,  BC or Goodies Powder, Naprosyn, Naproxen, Aleve, Aspirin    __X__ Stop all herbal supplements, fish oil or vitamin E until after surgery.    ____ Bring C-Pap to the hospital.

## 2020-02-08 DIAGNOSIS — Z992 Dependence on renal dialysis: Secondary | ICD-10-CM | POA: Diagnosis not present

## 2020-02-08 DIAGNOSIS — N186 End stage renal disease: Secondary | ICD-10-CM | POA: Diagnosis not present

## 2020-02-10 DIAGNOSIS — Z79899 Other long term (current) drug therapy: Secondary | ICD-10-CM | POA: Diagnosis not present

## 2020-02-10 DIAGNOSIS — Z992 Dependence on renal dialysis: Secondary | ICD-10-CM | POA: Diagnosis not present

## 2020-02-10 DIAGNOSIS — N186 End stage renal disease: Secondary | ICD-10-CM | POA: Diagnosis not present

## 2020-02-13 DIAGNOSIS — Z992 Dependence on renal dialysis: Secondary | ICD-10-CM | POA: Diagnosis not present

## 2020-02-13 DIAGNOSIS — N186 End stage renal disease: Secondary | ICD-10-CM | POA: Diagnosis not present

## 2020-02-14 DIAGNOSIS — I129 Hypertensive chronic kidney disease with stage 1 through stage 4 chronic kidney disease, or unspecified chronic kidney disease: Secondary | ICD-10-CM | POA: Diagnosis not present

## 2020-02-14 DIAGNOSIS — E1122 Type 2 diabetes mellitus with diabetic chronic kidney disease: Secondary | ICD-10-CM | POA: Diagnosis not present

## 2020-02-14 DIAGNOSIS — N183 Chronic kidney disease, stage 3 unspecified: Secondary | ICD-10-CM | POA: Diagnosis not present

## 2020-02-14 DIAGNOSIS — E782 Mixed hyperlipidemia: Secondary | ICD-10-CM | POA: Diagnosis not present

## 2020-02-14 DIAGNOSIS — I10 Essential (primary) hypertension: Secondary | ICD-10-CM | POA: Diagnosis not present

## 2020-02-15 DIAGNOSIS — Z992 Dependence on renal dialysis: Secondary | ICD-10-CM | POA: Diagnosis not present

## 2020-02-15 DIAGNOSIS — N186 End stage renal disease: Secondary | ICD-10-CM | POA: Diagnosis not present

## 2020-02-16 ENCOUNTER — Encounter
Admission: RE | Admit: 2020-02-16 | Discharge: 2020-02-16 | Disposition: A | Discharge status: Home / Self Care | Payer: Medicare HMO | Source: Ambulatory Visit | Attending: Vascular Surgery | Admitting: Vascular Surgery

## 2020-02-16 ENCOUNTER — Other Ambulatory Visit: Admission: RE | Admit: 2020-02-16 | Payer: Medicare HMO | Source: Ambulatory Visit

## 2020-02-16 ENCOUNTER — Other Ambulatory Visit: Payer: Self-pay

## 2020-02-16 DIAGNOSIS — Z20822 Contact with and (suspected) exposure to covid-19: Secondary | ICD-10-CM | POA: Insufficient documentation

## 2020-02-16 DIAGNOSIS — Z01818 Encounter for other preprocedural examination: Secondary | ICD-10-CM | POA: Diagnosis not present

## 2020-02-16 DIAGNOSIS — Z01810 Encounter for preprocedural cardiovascular examination: Secondary | ICD-10-CM | POA: Diagnosis not present

## 2020-02-16 LAB — BASIC METABOLIC PANEL
Anion gap: 15 (ref 5–15)
BUN: 59 mg/dL — ABNORMAL HIGH (ref 6–20)
CO2: 23 mmol/L (ref 22–32)
Calcium: 8.9 mg/dL (ref 8.9–10.3)
Chloride: 93 mmol/L — ABNORMAL LOW (ref 98–111)
Creatinine, Ser: 7.47 mg/dL — ABNORMAL HIGH (ref 0.61–1.24)
GFR calc Af Amer: 9 mL/min — ABNORMAL LOW (ref >60)
GFR calc non Af Amer: 8 mL/min — ABNORMAL LOW (ref >60)
Glucose, Bld: 328 mg/dL — ABNORMAL HIGH (ref 70–99)
Potassium: 3.6 mmol/L (ref 3.5–5.1)
Sodium: 131 mmol/L — ABNORMAL LOW (ref 135–145)

## 2020-02-16 LAB — CBC WITH DIFFERENTIAL/PLATELET
Abs Immature Granulocytes: 0.02 10*3/uL (ref 0.00–0.07)
Basophils Absolute: 0 10*3/uL (ref 0.0–0.1)
Basophils Relative: 1 %
Eosinophils Absolute: 0.1 10*3/uL (ref 0.0–0.5)
Eosinophils Relative: 2 %
HCT: 35.5 % — ABNORMAL LOW (ref 39.0–52.0)
Hemoglobin: 12.5 g/dL — ABNORMAL LOW (ref 13.0–17.0)
Immature Granulocytes: 0 %
Lymphocytes Relative: 26 %
Lymphs Abs: 1.9 10*3/uL (ref 0.7–4.0)
MCH: 31.9 pg (ref 26.0–34.0)
MCHC: 35.2 g/dL (ref 30.0–36.0)
MCV: 90.6 fL (ref 80.0–100.0)
Monocytes Absolute: 0.7 10*3/uL (ref 0.1–1.0)
Monocytes Relative: 9 %
Neutro Abs: 4.6 10*3/uL (ref 1.7–7.7)
Neutrophils Relative %: 62 %
Platelets: 127 10*3/uL — ABNORMAL LOW (ref 150–400)
RBC: 3.92 MIL/uL — ABNORMAL LOW (ref 4.22–5.81)
RDW: 12.9 % (ref 11.5–15.5)
WBC: 7.3 10*3/uL (ref 4.0–10.5)
nRBC: 0 % (ref 0.0–0.2)

## 2020-02-16 LAB — TYPE AND SCREEN
ABO/RH(D): O POS
Antibody Screen: NEGATIVE

## 2020-02-16 LAB — APTT: aPTT: 35 seconds (ref 24–36)

## 2020-02-16 LAB — SARS CORONAVIRUS 2 (TAT 6-24 HRS): SARS Coronavirus 2: NEGATIVE

## 2020-02-16 LAB — PROTIME-INR
INR: 1 (ref 0.8–1.2)
Prothrombin Time: 13.1 seconds (ref 11.4–15.2)

## 2020-02-16 MED ORDER — CLINDAMYCIN PHOSPHATE 300 MG/50ML IV SOLN: 300.0000 mg | INTRAVENOUS | Status: AC

## 2020-02-16 NOTE — Progress Notes (Signed)
  [facility] Perioperative Services: Pre-Admission/Anesthesia Testing  Abnormal Lab Notification    Date: 02/16/20  Name: CRISTIN SZATKOWSKI MRN:   [id number]  Re: Abnormal labs noted during PAT appointment   Provider Notified: Eulogio Ditch, NP Notification mode: Routed via [facility]   ABNORMAL LAB VALUE(S):  BUN 59 mg/dL and creatinine 7.47 mg/dL. Estimated Creatinine Clearance: 16.6 mL/min (A) (by C-G formula based on SCr of 7.47 mg/dL (H)).   Na+ 131 mmol/L  Glucose 328 mg/dL  NOTES: Patient is on HD on MWF. Scheduled for dialysis graft placement on 02/17/2020.  Honor Loh, MSN, APRN, FNP-C, CEN [facility]  Peri-operative Services Nurse Practitioner Phone: [phone] 02/16/20 2:01 PM

## 2020-02-17 ENCOUNTER — Ambulatory Visit: Admission: RE | Admit: 2020-02-17 | Payer: Medicare HMO | Source: Home / Self Care | Admitting: Vascular Surgery

## 2020-02-18 DIAGNOSIS — Z992 Dependence on renal dialysis: Secondary | ICD-10-CM | POA: Diagnosis not present

## 2020-02-18 DIAGNOSIS — N186 End stage renal disease: Secondary | ICD-10-CM | POA: Diagnosis not present

## 2020-02-20 DIAGNOSIS — Z992 Dependence on renal dialysis: Secondary | ICD-10-CM | POA: Diagnosis not present

## 2020-02-20 DIAGNOSIS — N186 End stage renal disease: Secondary | ICD-10-CM | POA: Diagnosis not present

## 2020-02-21 DIAGNOSIS — E113593 Type 2 diabetes mellitus with proliferative diabetic retinopathy without macular edema, bilateral: Secondary | ICD-10-CM | POA: Diagnosis not present

## 2020-02-21 DIAGNOSIS — Z9889 Other specified postprocedural states: Secondary | ICD-10-CM | POA: Diagnosis not present

## 2020-02-21 DIAGNOSIS — H401133 Primary open-angle glaucoma, bilateral, severe stage: Secondary | ICD-10-CM | POA: Diagnosis not present

## 2020-02-21 DIAGNOSIS — Z961 Presence of intraocular lens: Secondary | ICD-10-CM | POA: Diagnosis not present

## 2020-02-22 DIAGNOSIS — N186 End stage renal disease: Secondary | ICD-10-CM | POA: Diagnosis not present

## 2020-02-22 DIAGNOSIS — Z992 Dependence on renal dialysis: Secondary | ICD-10-CM | POA: Diagnosis not present

## 2020-02-23 ENCOUNTER — Encounter (INDEPENDENT_AMBULATORY_CARE_PROVIDER_SITE_OTHER): Payer: Medicare HMO | Admitting: Ophthalmology

## 2020-02-24 DIAGNOSIS — N186 End stage renal disease: Secondary | ICD-10-CM | POA: Diagnosis not present

## 2020-02-24 DIAGNOSIS — Z992 Dependence on renal dialysis: Secondary | ICD-10-CM | POA: Diagnosis not present

## 2020-02-26 DIAGNOSIS — Z881 Allergy status to other antibiotic agents status: Secondary | ICD-10-CM | POA: Diagnosis not present

## 2020-02-26 DIAGNOSIS — Z888 Allergy status to other drugs, medicaments and biological substances status: Secondary | ICD-10-CM | POA: Diagnosis not present

## 2020-02-26 DIAGNOSIS — K3184 Gastroparesis: Secondary | ICD-10-CM | POA: Diagnosis not present

## 2020-02-26 DIAGNOSIS — E119 Type 2 diabetes mellitus without complications: Secondary | ICD-10-CM | POA: Diagnosis not present

## 2020-02-26 DIAGNOSIS — Z886 Allergy status to analgesic agent status: Secondary | ICD-10-CM | POA: Diagnosis not present

## 2020-02-26 DIAGNOSIS — E785 Hyperlipidemia, unspecified: Secondary | ICD-10-CM | POA: Diagnosis not present

## 2020-02-26 DIAGNOSIS — Z880 Allergy status to penicillin: Secondary | ICD-10-CM | POA: Diagnosis not present

## 2020-02-26 DIAGNOSIS — H5440 Blindness, one eye, unspecified eye: Secondary | ICD-10-CM | POA: Diagnosis not present

## 2020-02-26 DIAGNOSIS — R1013 Epigastric pain: Secondary | ICD-10-CM | POA: Diagnosis not present

## 2020-02-27 DIAGNOSIS — N186 End stage renal disease: Secondary | ICD-10-CM | POA: Diagnosis not present

## 2020-02-27 DIAGNOSIS — Z992 Dependence on renal dialysis: Secondary | ICD-10-CM | POA: Diagnosis not present

## 2020-02-28 DIAGNOSIS — E119 Type 2 diabetes mellitus without complications: Secondary | ICD-10-CM | POA: Diagnosis not present

## 2020-02-28 DIAGNOSIS — R1010 Upper abdominal pain, unspecified: Secondary | ICD-10-CM | POA: Diagnosis not present

## 2020-02-28 DIAGNOSIS — I10 Essential (primary) hypertension: Secondary | ICD-10-CM | POA: Diagnosis not present

## 2020-02-28 DIAGNOSIS — F419 Anxiety disorder, unspecified: Secondary | ICD-10-CM | POA: Diagnosis not present

## 2020-02-28 DIAGNOSIS — R109 Unspecified abdominal pain: Secondary | ICD-10-CM | POA: Diagnosis not present

## 2020-02-29 DIAGNOSIS — Z992 Dependence on renal dialysis: Secondary | ICD-10-CM | POA: Diagnosis not present

## 2020-02-29 DIAGNOSIS — R9431 Abnormal electrocardiogram [ECG] [EKG]: Secondary | ICD-10-CM | POA: Diagnosis not present

## 2020-02-29 DIAGNOSIS — I252 Old myocardial infarction: Secondary | ICD-10-CM | POA: Diagnosis not present

## 2020-02-29 DIAGNOSIS — R1111 Vomiting without nausea: Secondary | ICD-10-CM | POA: Diagnosis not present

## 2020-02-29 DIAGNOSIS — Q625 Duplication of ureter: Secondary | ICD-10-CM | POA: Diagnosis not present

## 2020-02-29 DIAGNOSIS — I10 Essential (primary) hypertension: Secondary | ICD-10-CM | POA: Diagnosis not present

## 2020-02-29 DIAGNOSIS — R4182 Altered mental status, unspecified: Secondary | ICD-10-CM | POA: Diagnosis not present

## 2020-02-29 DIAGNOSIS — N186 End stage renal disease: Secondary | ICD-10-CM | POA: Diagnosis not present

## 2020-02-29 DIAGNOSIS — K4090 Unilateral inguinal hernia, without obstruction or gangrene, not specified as recurrent: Secondary | ICD-10-CM | POA: Diagnosis not present

## 2020-02-29 DIAGNOSIS — R109 Unspecified abdominal pain: Secondary | ICD-10-CM | POA: Diagnosis not present

## 2020-02-29 DIAGNOSIS — K2991 Gastroduodenitis, unspecified, with bleeding: Secondary | ICD-10-CM | POA: Diagnosis not present

## 2020-02-29 DIAGNOSIS — G9340 Encephalopathy, unspecified: Secondary | ICD-10-CM | POA: Diagnosis not present

## 2020-02-29 DIAGNOSIS — K2971 Gastritis, unspecified, with bleeding: Secondary | ICD-10-CM | POA: Diagnosis not present

## 2020-02-29 DIAGNOSIS — E119 Type 2 diabetes mellitus without complications: Secondary | ICD-10-CM | POA: Diagnosis not present

## 2020-02-29 DIAGNOSIS — Q630 Accessory kidney: Secondary | ICD-10-CM | POA: Diagnosis not present

## 2020-02-29 DIAGNOSIS — R079 Chest pain, unspecified: Secondary | ICD-10-CM | POA: Diagnosis not present

## 2020-02-29 DIAGNOSIS — N62 Hypertrophy of breast: Secondary | ICD-10-CM | POA: Diagnosis not present

## 2020-03-01 ENCOUNTER — Encounter (INDEPENDENT_AMBULATORY_CARE_PROVIDER_SITE_OTHER): Payer: Medicare HMO | Admitting: Ophthalmology

## 2020-03-01 ENCOUNTER — Inpatient Hospital Stay (HOSPITAL_COMMUNITY)
Admission: AD | Admit: 2020-03-01 | Discharge: 2020-03-03 | DRG: 368 | Disposition: A | Discharge status: Home / Self Care | Payer: Medicare HMO | Source: Other Acute Inpatient Hospital | Attending: Internal Medicine | Admitting: Internal Medicine

## 2020-03-01 DIAGNOSIS — E113532 Type 2 diabetes mellitus with proliferative diabetic retinopathy with traction retinal detachment not involving the macula, left eye: Secondary | ICD-10-CM

## 2020-03-01 DIAGNOSIS — Z880 Allergy status to penicillin: Secondary | ICD-10-CM

## 2020-03-01 DIAGNOSIS — E669 Obesity, unspecified: Secondary | ICD-10-CM | POA: Diagnosis present

## 2020-03-01 DIAGNOSIS — Z992 Dependence on renal dialysis: Secondary | ICD-10-CM

## 2020-03-01 DIAGNOSIS — N186 End stage renal disease: Secondary | ICD-10-CM

## 2020-03-01 DIAGNOSIS — Z8616 Personal history of COVID-19: Secondary | ICD-10-CM | POA: Diagnosis not present

## 2020-03-01 DIAGNOSIS — E1151 Type 2 diabetes mellitus with diabetic peripheral angiopathy without gangrene: Secondary | ICD-10-CM | POA: Diagnosis present

## 2020-03-01 DIAGNOSIS — R109 Unspecified abdominal pain: Secondary | ICD-10-CM | POA: Diagnosis present

## 2020-03-01 DIAGNOSIS — E1122 Type 2 diabetes mellitus with diabetic chronic kidney disease: Secondary | ICD-10-CM | POA: Diagnosis present

## 2020-03-01 DIAGNOSIS — G9341 Metabolic encephalopathy: Secondary | ICD-10-CM | POA: Diagnosis present

## 2020-03-01 DIAGNOSIS — Z794 Long term (current) use of insulin: Secondary | ICD-10-CM | POA: Diagnosis not present

## 2020-03-01 DIAGNOSIS — E119 Type 2 diabetes mellitus without complications: Secondary | ICD-10-CM | POA: Diagnosis not present

## 2020-03-01 DIAGNOSIS — H409 Unspecified glaucoma: Secondary | ICD-10-CM | POA: Diagnosis present

## 2020-03-01 DIAGNOSIS — I120 Hypertensive chronic kidney disease with stage 5 chronic kidney disease or end stage renal disease: Secondary | ICD-10-CM | POA: Diagnosis present

## 2020-03-01 DIAGNOSIS — K760 Fatty (change of) liver, not elsewhere classified: Secondary | ICD-10-CM | POA: Diagnosis present

## 2020-03-01 DIAGNOSIS — K3184 Gastroparesis: Secondary | ICD-10-CM | POA: Diagnosis present

## 2020-03-01 DIAGNOSIS — E1121 Type 2 diabetes mellitus with diabetic nephropathy: Secondary | ICD-10-CM | POA: Diagnosis not present

## 2020-03-01 DIAGNOSIS — Z781 Physical restraint status: Secondary | ICD-10-CM

## 2020-03-01 DIAGNOSIS — K2101 Gastro-esophageal reflux disease with esophagitis, with bleeding: Secondary | ICD-10-CM | POA: Diagnosis present

## 2020-03-01 DIAGNOSIS — K4090 Unilateral inguinal hernia, without obstruction or gangrene, not specified as recurrent: Secondary | ICD-10-CM | POA: Diagnosis not present

## 2020-03-01 DIAGNOSIS — K920 Hematemesis: Secondary | ICD-10-CM | POA: Diagnosis present

## 2020-03-01 DIAGNOSIS — I10 Essential (primary) hypertension: Secondary | ICD-10-CM | POA: Diagnosis not present

## 2020-03-01 DIAGNOSIS — H548 Legal blindness, as defined in USA: Secondary | ICD-10-CM | POA: Diagnosis present

## 2020-03-01 DIAGNOSIS — E113592 Type 2 diabetes mellitus with proliferative diabetic retinopathy without macular edema, left eye: Secondary | ICD-10-CM | POA: Diagnosis present

## 2020-03-01 DIAGNOSIS — E876 Hypokalemia: Secondary | ICD-10-CM | POA: Diagnosis present

## 2020-03-01 DIAGNOSIS — Z886 Allergy status to analgesic agent status: Secondary | ICD-10-CM

## 2020-03-01 DIAGNOSIS — G8929 Other chronic pain: Secondary | ICD-10-CM | POA: Diagnosis present

## 2020-03-01 DIAGNOSIS — F419 Anxiety disorder, unspecified: Secondary | ICD-10-CM | POA: Diagnosis present

## 2020-03-01 DIAGNOSIS — K922 Gastrointestinal hemorrhage, unspecified: Secondary | ICD-10-CM | POA: Diagnosis not present

## 2020-03-01 DIAGNOSIS — R451 Restlessness and agitation: Secondary | ICD-10-CM | POA: Diagnosis not present

## 2020-03-01 DIAGNOSIS — E1143 Type 2 diabetes mellitus with diabetic autonomic (poly)neuropathy: Secondary | ICD-10-CM | POA: Diagnosis present

## 2020-03-01 DIAGNOSIS — E1129 Type 2 diabetes mellitus with other diabetic kidney complication: Secondary | ICD-10-CM | POA: Diagnosis not present

## 2020-03-01 DIAGNOSIS — I252 Old myocardial infarction: Secondary | ICD-10-CM | POA: Diagnosis not present

## 2020-03-01 DIAGNOSIS — Z881 Allergy status to other antibiotic agents status: Secondary | ICD-10-CM

## 2020-03-01 DIAGNOSIS — Z888 Allergy status to other drugs, medicaments and biological substances status: Secondary | ICD-10-CM

## 2020-03-01 DIAGNOSIS — D631 Anemia in chronic kidney disease: Secondary | ICD-10-CM | POA: Diagnosis present

## 2020-03-01 DIAGNOSIS — Z89411 Acquired absence of right great toe: Secondary | ICD-10-CM | POA: Diagnosis not present

## 2020-03-01 DIAGNOSIS — Z6838 Body mass index (BMI) 38.0-38.9, adult: Secondary | ICD-10-CM

## 2020-03-01 DIAGNOSIS — Z8249 Family history of ischemic heart disease and other diseases of the circulatory system: Secondary | ICD-10-CM

## 2020-03-01 DIAGNOSIS — G9340 Encephalopathy, unspecified: Secondary | ICD-10-CM | POA: Diagnosis not present

## 2020-03-01 DIAGNOSIS — Z79899 Other long term (current) drug therapy: Secondary | ICD-10-CM

## 2020-03-01 DIAGNOSIS — Z833 Family history of diabetes mellitus: Secondary | ICD-10-CM

## 2020-03-01 DIAGNOSIS — Z91013 Allergy to seafood: Secondary | ICD-10-CM

## 2020-03-01 DIAGNOSIS — F329 Major depressive disorder, single episode, unspecified: Secondary | ICD-10-CM | POA: Diagnosis present

## 2020-03-01 DIAGNOSIS — N62 Hypertrophy of breast: Secondary | ICD-10-CM | POA: Diagnosis not present

## 2020-03-01 DIAGNOSIS — K2971 Gastritis, unspecified, with bleeding: Secondary | ICD-10-CM | POA: Diagnosis not present

## 2020-03-01 DIAGNOSIS — Z20822 Contact with and (suspected) exposure to covid-19: Secondary | ICD-10-CM | POA: Diagnosis present

## 2020-03-01 DIAGNOSIS — R9431 Abnormal electrocardiogram [ECG] [EKG]: Secondary | ICD-10-CM | POA: Diagnosis not present

## 2020-03-01 LAB — BASIC METABOLIC PANEL
Anion gap: 20 — ABNORMAL HIGH (ref 5–15)
BUN: 49 mg/dL — ABNORMAL HIGH (ref 6–20)
CO2: 21 mmol/L — ABNORMAL LOW (ref 22–32)
Calcium: 9.3 mg/dL (ref 8.9–10.3)
Chloride: 94 mmol/L — ABNORMAL LOW (ref 98–111)
Creatinine, Ser: 8.06 mg/dL — ABNORMAL HIGH (ref 0.61–1.24)
GFR calc Af Amer: 8 mL/min — ABNORMAL LOW (ref >60)
GFR calc non Af Amer: 7 mL/min — ABNORMAL LOW (ref >60)
Glucose, Bld: 277 mg/dL — ABNORMAL HIGH (ref 70–99)
Potassium: 3.1 mmol/L — ABNORMAL LOW (ref 3.5–5.1)
Sodium: 135 mmol/L (ref 135–145)

## 2020-03-01 LAB — CBC WITH DIFFERENTIAL/PLATELET
Abs Immature Granulocytes: 0.05 10*3/uL (ref 0.00–0.07)
Basophils Absolute: 0 10*3/uL (ref 0.0–0.1)
Basophils Relative: 0 %
Eosinophils Absolute: 0 10*3/uL (ref 0.0–0.5)
Eosinophils Relative: 0 %
HCT: 40.7 % (ref 39.0–52.0)
Hemoglobin: 14.1 g/dL (ref 13.0–17.0)
Immature Granulocytes: 0 %
Lymphocytes Relative: 13 %
Lymphs Abs: 1.5 10*3/uL (ref 0.7–4.0)
MCH: 32.7 pg (ref 26.0–34.0)
MCHC: 34.6 g/dL (ref 30.0–36.0)
MCV: 94.4 fL (ref 80.0–100.0)
Monocytes Absolute: 1.1 10*3/uL — ABNORMAL HIGH (ref 0.1–1.0)
Monocytes Relative: 9 %
Neutro Abs: 8.9 10*3/uL — ABNORMAL HIGH (ref 1.7–7.7)
Neutrophils Relative %: 78 %
Platelets: 150 10*3/uL (ref 150–400)
RBC: 4.31 MIL/uL (ref 4.22–5.81)
RDW: 12.8 % (ref 11.5–15.5)
WBC: 11.6 10*3/uL — ABNORMAL HIGH (ref 4.0–10.5)
nRBC: 0 % (ref 0.0–0.2)

## 2020-03-01 LAB — HEPATIC FUNCTION PANEL
ALT: 20 U/L (ref 0–44)
AST: 25 U/L (ref 15–41)
Albumin: 4.7 g/dL (ref 3.5–5.0)
Alkaline Phosphatase: 55 U/L (ref 38–126)
Bilirubin, Direct: 0.2 mg/dL (ref 0.0–0.2)
Indirect Bilirubin: 0.9 mg/dL (ref 0.3–0.9)
Total Bilirubin: 1.1 mg/dL (ref 0.3–1.2)
Total Protein: 8.8 g/dL — ABNORMAL HIGH (ref 6.5–8.1)

## 2020-03-01 LAB — PROTIME-INR
INR: 1.2 (ref 0.8–1.2)
Prothrombin Time: 14.5 seconds (ref 11.4–15.2)

## 2020-03-01 LAB — TYPE AND SCREEN
ABO/RH(D): O POS
Antibody Screen: NEGATIVE

## 2020-03-01 LAB — GLUCOSE, CAPILLARY: Glucose-Capillary: 272 mg/dL — ABNORMAL HIGH (ref 70–99)

## 2020-03-01 LAB — LIPASE, BLOOD: Lipase: 38 U/L (ref 11–51)

## 2020-03-01 MED ORDER — ALPRAZOLAM 0.5 MG PO TABS
0.5000 mg | ORAL_TABLET | Freq: Two times a day (BID) | ORAL | Status: DC | PRN
  Administered 2020-03-01: 0.5 mg via ORAL
  Filled 2020-03-01: qty 1

## 2020-03-01 MED ORDER — INSULIN ASPART PROT & ASPART (70-30 MIX) 100 UNIT/ML ~~LOC~~ SUSP
5.0000 [IU] | Freq: Two times a day (BID) | SUBCUTANEOUS | Status: DC
  Filled 2020-03-01: qty 10

## 2020-03-01 MED ORDER — AMLODIPINE BESYLATE 5 MG PO TABS
5.0000 mg | ORAL_TABLET | Freq: Every day | ORAL | Status: DC
  Administered 2020-03-01: 5 mg via ORAL
  Filled 2020-03-01: qty 1

## 2020-03-01 MED ORDER — DORZOLAMIDE HCL-TIMOLOL MAL 2-0.5 % OP SOLN
1.0000 [drp] | Freq: Two times a day (BID) | OPHTHALMIC | Status: DC
  Administered 2020-03-02 (×2): 1 [drp] via OPHTHALMIC
  Filled 2020-03-01: qty 10

## 2020-03-01 MED ORDER — BRIMONIDINE TARTRATE 0.15 % OP SOLN
1.0000 [drp] | Freq: Three times a day (TID) | OPHTHALMIC | Status: DC
  Administered 2020-03-02 (×2): 1 [drp] via OPHTHALMIC
  Filled 2020-03-01: qty 5

## 2020-03-01 MED ORDER — LORAZEPAM 2 MG/ML IJ SOLN
1.0000 mg | Freq: Once | INTRAMUSCULAR | Status: AC
  Administered 2020-03-01: 1 mg via INTRAVENOUS
  Filled 2020-03-01: qty 1

## 2020-03-01 MED ORDER — SODIUM CHLORIDE 0.9 % IV SOLN
8.0000 mg/h | INTRAVENOUS | Status: DC
  Administered 2020-03-01: 8 mg/h via INTRAVENOUS
  Filled 2020-03-01 (×8): qty 80

## 2020-03-01 MED ORDER — CLONIDINE HCL 0.2 MG PO TABS: 0.2000 mg | ORAL_TABLET | Freq: Every day | ORAL | Status: DC

## 2020-03-01 MED ORDER — SODIUM CHLORIDE 0.9 % IV SOLN
80.0000 mg | Freq: Once | INTRAVENOUS | Status: AC
  Administered 2020-03-01: 80 mg via INTRAVENOUS
  Filled 2020-03-01: qty 80

## 2020-03-01 MED ORDER — INSULIN ASPART 100 UNIT/ML ~~LOC~~ SOLN
0.0000 [IU] | Freq: Three times a day (TID) | SUBCUTANEOUS | Status: DC
  Administered 2020-03-02: 5 [IU] via SUBCUTANEOUS
  Administered 2020-03-03: 3 [IU] via SUBCUTANEOUS

## 2020-03-01 NOTE — H&P (Signed)
TRH H&P   Patient Demographics:    Timothy Clark, is a 49 y.o. male  MRN: [id number]   DOB - 1970-09-09  Admit Date - 03/01/2020  Outpatient Primary MD for the patient is [name], MD  Referring MD/NP/PA: From [facility]  Patient coming from: From [facility]  No chief complaint on file.     HPI:    Timothy Clark  is a 49 y.o. male, with a history of diabetes mellitus type 2, gastroparesis, hypertension, ESRD on hemodialysis Monday Wednesday Friday, peripheral vascular disease, morbid obesity, anxiety/depression, chronic abdominal pain, gastroparesis, patient was sent from [facility] for coffee-ground emesis. -Patient presents to ED and [facility] secondary to complaint of abdominal pain, nausea, this appears to be chronic problem, per the record this is a third visit to the ED (multiple facilities) in the last 7 days, patient has downtrending lipase,, patient had CT abdomen and pelvis which was reportedly unremarkable, patient was being prepared for discharge, then he had an episode of coffee-ground emesis, denies any NSAID use, aspirin, or any blood thinners or Goody's powder, he denies any previous episodes, his hemoglobin was 13.9 on admission to ED (this is a prior to his vomiting, no repeat has been done since then, reportedly his vital signs were stable, and his INR was normal).  Patient is following with local GI Dr. Buford Dresser, so is fair has been requested for continued T of care, patient reported he remains on Carafate, as well he still taking his Protonix twice daily, there has been no recurrence of vomiting since his admission to [facility].    Review of systems:    In addition to the HPI above,  No Fever-chills, No Headache, No changes with Vision or hearing, No problems swallowing food or Liquids, No Chest pain, Cough or Shortness of  Breath, Patient reports chronic abdominal pain, reported coffee-ground emesis at referring facility . No Blood in stool or Urine, No dysuria, No new skin rashes or bruises, No new joints pains-aches,  No new weakness, tingling, numbness in any extremity, No recent weight gain or loss, No polyuria, polydypsia or polyphagia, No significant Mental Stressors.  A full 10 point Review of Systems was done, except as stated above, all other Review of Systems were negative.   With Past History of the following :    Past Medical History:  Diagnosis Date  . Anemia   . Anxiety   . CHF (congestive heart failure) (HCC)    diastolic dysfunction  . Chronic abdominal pain   . COVID-19 07/2019  . Depression   . Esophagitis   . ESRD on hemodialysis ([place])   . Eye hemorrhage   . Gastritis   . Gastroparesis   . GERD (gastroesophageal reflux disease)   . Glaucoma   . Helicobacter pylori (H. pylori) infection 2016   ERADICATION DOCUMENTED VIA STOOL TEST in AUG  2016 at [place]  . Hiccups   . Hypertension   . Nausea and vomiting    chronic, recurrent  . Neuropathy   . Renal insufficiency   . Type 2 diabetes mellitus with complications (HCC)    diagnosed around age 55      Past Surgical History:  Procedure Laterality Date  . AMPUTATION TOE Right    great toe  . AV FISTULA INSERTION W/ RF MAGNETIC GUIDANCE Left 04/06/2019   Procedure: AV FISTULA INSERTION W/RF MAGNETIC GUIDANCE;  Surgeon: Katha Cabal, MD;  Location: [place] [facility];  Service: Cardiovascular;  Laterality: Left;  . AV FISTULA PLACEMENT Left 05/06/2019   Procedure: INSERTION OF ARTERIOVENOUS (AV) GORE-TEX GRAFT ARM ( FOREARM LOOP );  Surgeon: Katha Cabal, MD;  Location: [facility];  Service: Vascular;  Laterality: Left;  . CATARACT EXTRACTION W/PHACO Left 05/19/2016   Procedure: CATARACT EXTRACTION PHACO AND INTRAOCULAR LENS PLACEMENT LEFT EYE;  Surgeon: Tonny Branch, MD;  Location: [facility];  Service: Ophthalmology;   Laterality: Left;  CDE: 7.30  . CATARACT EXTRACTION W/PHACO Right 06/23/2016   Procedure: CATARACT EXTRACTION PHACO AND INTRAOCULAR LENS PLACEMENT RIGHT EYE CDE=9.87;  Surgeon: Tonny Branch, MD;  Location: [facility];  Service: Ophthalmology;  Laterality: Right;  right  . DIALYSIS/PERMA CATHETER REMOVAL N/A 07/19/2019   Procedure: DIALYSIS/PERMA CATHETER REMOVAL;  Surgeon: Katha Cabal, MD;  Location: [place] [facility];  Service: Cardiovascular;  Laterality: N/A;  . ESOPHAGOGASTRODUODENOSCOPY  2015   Dr. Britta Mccreedy  . ESOPHAGOGASTRODUODENOSCOPY N/A 10/26/2014   RMR: Distal esophagititis-likely reflux related although an element of pill induced injuruy no exclueded  status post biopsy. Diffusely abnormal gastric mucosa of uncertain signigicane -status post gastric biopsy. Focal area of excoriation in the cardia most consistant with a trauma of heaving.   . ESOPHAGOGASTRODUODENOSCOPY  08/2015   [facility]: mild esophagitis and old gastric contents  . EYE SURGERY  2015   stent placed to left eye  . EYE SURGERY  06/2019   per patient- to remove scar tissue  . INCISION AND DRAINAGE OF WOUND Right 07/16/2017   Procedure: IRRIGATION AND DEBRIDEMENT OF SOFT TISSUE OF ULCERATION RIGHT FOOT;  Surgeon: Caprice Beaver, DPM;  Location: [facility];  Service: Podiatry;  Laterality: Right;  . PERIPHERAL VASCULAR THROMBECTOMY Left 09/27/2019   Procedure: PERIPHERAL VASCULAR THROMBECTOMY;  Surgeon: Katha Cabal, MD;  Location: [place] [facility];  Service: Cardiovascular;  Laterality: Left;  . PERIPHERAL VASCULAR THROMBECTOMY Left 10/27/2019   Procedure: PERIPHERAL VASCULAR THROMBECTOMY;  Surgeon: Algernon Huxley, MD;  Location: [place] [facility];  Service: Cardiovascular;  Laterality: Left;  . PERIPHERAL VASCULAR THROMBECTOMY Left 12/13/2019   Procedure: PERIPHERAL VASCULAR THROMBECTOMY;  Surgeon: Katha Cabal, MD;  Location: [place] [facility];  Service: Cardiovascular;  Laterality: Left;      Social  History:     Social History   Tobacco Use  . Smoking status: Never Smoker  . Smokeless tobacco: Never Used  Substance Use Topics  . Alcohol use: No    Alcohol/week: 0.0 standard drinks      Family History :     Family History  Problem Relation Age of Onset  . Ovarian cancer Mother   . Heart attack Father   . Colon polyps Father   . Cervical cancer Sister   . Diabetes Sister   . Colon cancer Neg Hx       Home Medications:   Prior to Admission medications   Medication Sig Start  Date End Date Taking? Authorizing Provider  ALPRAZolam Duanne Moron) 0.5 MG tablet Take 0.5 mg by mouth 2 (two) times daily as needed.     [provider]  amLODipine (NORVASC) 5 MG tablet Take 5 mg by mouth daily.    [provider]  atorvastatin (LIPITOR) 10 MG tablet Take 10 mg by mouth at bedtime.     [provider]  brimonidine (ALPHAGAN) 0.15 % ophthalmic solution Place 1 drop into both eyes in the morning and at bedtime.     [provider]  carvedilol (COREG) 25 MG tablet Take 25 mg by mouth 2 (two) times daily with a meal.    [provider]  cloNIDine (CATAPRES) 0.2 MG tablet Take 0.2 mg by mouth daily.     [provider]  cyanocobalamin (,VITAMIN B-12,) 1000 MCG/ML injection Inject 1,000 mcg into the muscle every 30 (thirty) days.  04/19/18   [provider]  dorzolamide-timolol (COSOPT) 22.3-6.8 MG/ML ophthalmic solution  01/16/20   [provider]  folic acid (FOLVITE) 1 MG tablet Take 1 mg by mouth daily.    [provider]  insulin NPH Human (NOVOLIN N) 100 UNIT/ML injection Inject into the skin. Patient not taking: Reported on 01/26/2020    [provider]  metoCLOPramide (REGLAN) 5 MG tablet TAKE ONE TABLET BY MOUTH BEFORE BREAKFAST 01/04/20   Jodi Mourning, Kristen S, PA-C  NOVOLOG MIX 70/30 FLEXPEN (70-30) 100 UNIT/ML FlexPen 35-55 Units by Subconjunctival route See admin instructions. Inject 55 units  subcutaneously in the morning & inject 35 units subcutaneously in the evening. 06/21/19   [provider]  pantoprazole (PROTONIX) 40 MG tablet Take 1 tablet (40 mg total) by mouth 2 (two) times daily before a meal. 08/24/18   Annitta Needs, NP  promethazine (PHENERGAN) 25 MG tablet Take 12.5 mg by mouth every 6 (six) hours as needed for nausea or vomiting.     [provider]  sertraline (ZOLOFT) 50 MG tablet Take 50 mg by mouth daily.    [provider]  sitaGLIPtin (JANUVIA) 25 MG tablet Take 25 mg by mouth daily.     [provider]  sucralfate (CARAFATE) 1 g tablet Take 1 g by mouth 4 (four) times daily.     [provider]  timolol (TIMOPTIC) 0.5 % ophthalmic solution Place 1 drop into both eyes 2 (two) times daily.    [provider]  torsemide (DEMADEX) 20 MG tablet Take 40 mg by mouth daily.     [provider]     Allergies:     Allergies  Allergen Reactions  . Donnatal [Phenobarbital-Belladonna Alk] Anaphylaxis and Other (See Comments)    Reaction to GI cocktail  . Fish Allergy Anaphylaxis, Hives and Rash  . Haldol [Haloperidol Lactate] Other (See Comments)    Chest Pain  . Maalox [Calcium Carbonate Antacid] Anaphylaxis and Other (See Comments)    Reaction to GI cocktail  . Shellfish Allergy Hives    Per patient: Patient stated he breaks out in hives when eating shellfish  . Marinol [Dronabinol] Nausea And Vomiting and Other (See Comments)    Caused worsening vomiting.   . Aspirin Hives and Itching  . Bactrim [Sulfamethoxazole-Trimethoprim] Itching  . Keflex [Cephalexin] Hives, Itching and Nausea And Vomiting  . Penicillins Hives, Itching and Other (See Comments)    Has patient had a PCN reaction causing immediate rash, facial/tongue/throat swelling, SOB or lightheadedness with hypotension: yes Has patient had a PCN reaction causing severe  rash involving mucus membranes or skin necrosis: yes Has patient had a PCN  reaction that required hospitalization no Has patient had a PCN reaction occurring within the last 10 years: yes If all of the above answers are "NO", then may proceed with Cephalospor     Physical Exam:   Vitals  Blood pressure 116/79, pulse 90, temperature 98.6 F (37 C), temperature source Oral, resp. rate 18, height _0  (1.803 m), weight (!) 124.5 kg, SpO2 100 %.   1. General developed male, laying in bed, in no apparent distress 2. Normal affect and insight, Not Suicidal or Homicidal, Awake Alert, Oriented X 3.  3. No F.N deficits, ALL C.Nerves Intact, Strength 5/5 all 4 extremities, Sensation intact all 4 extremities, Plantars down going.  4. Ears  appear Normal, patient is legally blind, moist Oral Mucosa.  5. Supple Neck, No JVD, No cervical lymphadenopathy appriciated, No Carotid Bruits.  6. Symmetrical Chest wall movement, Good air movement bilaterally, CTAB.  7. RRR, No Gallops, Rubs or Murmurs, No Parasternal Heave.  8. Positive Bowel Sounds, Abdomen Soft, No tenderness, No organomegaly appriciated,No rebound -guarding or rigidity.  9.  No Cyanosis, Normal Skin Turgor, No Skin Rash or Bruise.  10. Good muscle tone,  joints appear normal , no effusions, Normal ROM.  11. No Palpable Lymph Nodes in Neck or Axillae    Data Review:    CBC No results for input(s): WBC, HGB, HCT, PLT, MCV, MCH, MCHC, RDW, LYMPHSABS, MONOABS, EOSABS, BASOSABS, BANDABS in the last 168 hours.  Invalid input(s): NEUTRABS, BANDSABD ------------------------------------------------------------------------------------------------------------------  Chemistries  No results for input(s): NA, K, CL, CO2, GLUCOSE, BUN, CREATININE, CALCIUM, MG, AST, ALT, ALKPHOS, BILITOT in the last 168 hours.  Invalid input(s): GFRCGP ------------------------------------------------------------------------------------------------------------------ estimated creatinine clearance is 15.9 mL/min (A) (by C-G  formula based on SCr of 7.47 mg/dL (H)). ------------------------------------------------------------------------------------------------------------------ No results for input(s): TSH, T4TOTAL, T3FREE, THYROIDAB in the last 72 hours.  Invalid input(s): FREET3  Coagulation profile No results for input(s): INR, PROTIME in the last 168 hours. ------------------------------------------------------------------------------------------------------------------- No results for input(s): DDIMER in the last 72 hours. -------------------------------------------------------------------------------------------------------------------  Cardiac Enzymes No results for input(s): CKMB, TROPONINI, MYOGLOBIN in the last 168 hours.  Invalid input(s): CK ------------------------------------------------------------------------------------------------------------------    Component Value Date/Time   BNP 1,422.0 (H) 06/09/2018 1146     ---------------------------------------------------------------------------------------------------------------  Urinalysis    Component Value Date/Time   COLORURINE YELLOW 10/24/2018 1424   APPEARANCEUR CLEAR 10/24/2018 1424   LABSPEC 1.014 10/24/2018 1424   PHURINE 5.0 10/24/2018 1424   GLUCOSEU >=500 (A) 10/24/2018 1424   HGBUR MODERATE (A) 10/24/2018 1424   BILIRUBINUR NEGATIVE 10/24/2018 1424   KETONESUR NEGATIVE 10/24/2018 1424   PROTEINUR >=300 (A) 10/24/2018 1424   UROBILINOGEN 0.2 06/07/2015 1353   NITRITE NEGATIVE 10/24/2018 1424   LEUKOCYTESUR NEGATIVE 10/24/2018 1424    ----------------------------------------------------------------------------------------------------------------   Imaging Results:    No results found.  My personal review of EKG: She is ordered on admission and pending   Assessment & Plan:    Active Problems:   Essential hypertension   Chronic abdominal pain   Gastroparesis   ESRD on dialysis ([place])   Controlled type 2  diabetes mellitus with left eye affected by proliferative retinopathy with traction retinal detachment not involving macula, with long-term current use of insulin (HCC)   Coffee ground emesis    Coffee-ground emesis -This is most likely due to upper GI bleed, with history of esophagitis and endoscopy in 2016, he denies any NSAIDs, aspirin or blood  thinners use. -At referring facility was 13.9, but he had coffee-ground emesis episode after that, repeat labs are pending, will check INR as well, will keep active type and screen, and will keep n.p.o., and start on Protonix drip. -consult GI. -Have ordered CBC, BMP, LFTs, INR, active type screen on admission.  ESRD -Dialysis Monday Wednesday Friday, he was dialyzed on Wednesday, will consult renal to dialyze tomorrow.  Hypertension -Continue with blood pressure is stable, awaiting medication reconciliation, will hold meds if blood pressure remains stable in the setting of GI bleed.  Diabetes mellitus type II -Home medication after reconciliation and will add on insulin sliding scale as well.  Chronic abdominal pain with gastroparesis -This seems to be chronic problem, CT abdomen pelvis from referring facility with no acute findings, avoid narcotics   legally blind -Continue with home eyedrops    DVT Prophylaxis  SCDs  AM Labs Ordered, also please review Full Orders  Family Communication: Admission, patients condition and plan of care including tests being ordered have been discussed with the patient  who indicate understanding and agree with the plan and Code Status.  Code Status Full  Likely DC to home  Condition GUARDED    Consults called: renal ,GI placed on EPIC  Admission status: observation  Time spent in minutes : 55 minutes   Phillips Climes M.D on 03/01/2020 at 6:52 PM   [facility] - Office  [phone]

## 2020-03-02 DIAGNOSIS — E876 Hypokalemia: Secondary | ICD-10-CM | POA: Diagnosis present

## 2020-03-02 DIAGNOSIS — F329 Major depressive disorder, single episode, unspecified: Secondary | ICD-10-CM | POA: Diagnosis present

## 2020-03-02 DIAGNOSIS — R109 Unspecified abdominal pain: Secondary | ICD-10-CM

## 2020-03-02 DIAGNOSIS — E1122 Type 2 diabetes mellitus with diabetic chronic kidney disease: Secondary | ICD-10-CM | POA: Diagnosis present

## 2020-03-02 DIAGNOSIS — Z8616 Personal history of COVID-19: Secondary | ICD-10-CM | POA: Diagnosis not present

## 2020-03-02 DIAGNOSIS — D631 Anemia in chronic kidney disease: Secondary | ICD-10-CM | POA: Diagnosis present

## 2020-03-02 DIAGNOSIS — K760 Fatty (change of) liver, not elsewhere classified: Secondary | ICD-10-CM | POA: Diagnosis present

## 2020-03-02 DIAGNOSIS — Z992 Dependence on renal dialysis: Secondary | ICD-10-CM | POA: Diagnosis not present

## 2020-03-02 DIAGNOSIS — H548 Legal blindness, as defined in USA: Secondary | ICD-10-CM | POA: Diagnosis present

## 2020-03-02 DIAGNOSIS — K2101 Gastro-esophageal reflux disease with esophagitis, with bleeding: Secondary | ICD-10-CM | POA: Diagnosis present

## 2020-03-02 DIAGNOSIS — G8929 Other chronic pain: Secondary | ICD-10-CM | POA: Diagnosis present

## 2020-03-02 DIAGNOSIS — K920 Hematemesis: Secondary | ICD-10-CM | POA: Diagnosis present

## 2020-03-02 DIAGNOSIS — G9341 Metabolic encephalopathy: Secondary | ICD-10-CM

## 2020-03-02 DIAGNOSIS — I10 Essential (primary) hypertension: Secondary | ICD-10-CM

## 2020-03-02 DIAGNOSIS — N186 End stage renal disease: Secondary | ICD-10-CM

## 2020-03-02 DIAGNOSIS — R451 Restlessness and agitation: Secondary | ICD-10-CM | POA: Diagnosis not present

## 2020-03-02 DIAGNOSIS — K3184 Gastroparesis: Secondary | ICD-10-CM | POA: Diagnosis present

## 2020-03-02 DIAGNOSIS — E669 Obesity, unspecified: Secondary | ICD-10-CM | POA: Diagnosis present

## 2020-03-02 DIAGNOSIS — E1151 Type 2 diabetes mellitus with diabetic peripheral angiopathy without gangrene: Secondary | ICD-10-CM | POA: Diagnosis present

## 2020-03-02 DIAGNOSIS — E1143 Type 2 diabetes mellitus with diabetic autonomic (poly)neuropathy: Secondary | ICD-10-CM | POA: Diagnosis present

## 2020-03-02 DIAGNOSIS — I120 Hypertensive chronic kidney disease with stage 5 chronic kidney disease or end stage renal disease: Secondary | ICD-10-CM | POA: Diagnosis present

## 2020-03-02 DIAGNOSIS — H409 Unspecified glaucoma: Secondary | ICD-10-CM | POA: Diagnosis present

## 2020-03-02 DIAGNOSIS — E113592 Type 2 diabetes mellitus with proliferative diabetic retinopathy without macular edema, left eye: Secondary | ICD-10-CM | POA: Diagnosis present

## 2020-03-02 DIAGNOSIS — Z89411 Acquired absence of right great toe: Secondary | ICD-10-CM | POA: Diagnosis not present

## 2020-03-02 DIAGNOSIS — F419 Anxiety disorder, unspecified: Secondary | ICD-10-CM | POA: Diagnosis present

## 2020-03-02 DIAGNOSIS — Z20822 Contact with and (suspected) exposure to covid-19: Secondary | ICD-10-CM | POA: Diagnosis present

## 2020-03-02 LAB — AMMONIA: Ammonia: 26 umol/L (ref 9–35)

## 2020-03-02 LAB — TSH: TSH: 0.873 u[IU]/mL (ref 0.350–4.500)

## 2020-03-02 LAB — GLUCOSE, CAPILLARY
Glucose-Capillary: 296 mg/dL — ABNORMAL HIGH (ref 70–99)
Glucose-Capillary: 188 mg/dL — ABNORMAL HIGH (ref 70–99)
Glucose-Capillary: 219 mg/dL — ABNORMAL HIGH (ref 70–99)

## 2020-03-02 LAB — CBC
HCT: 36.9 % — ABNORMAL LOW (ref 39.0–52.0)
Hemoglobin: 12.9 g/dL — ABNORMAL LOW (ref 13.0–17.0)
MCH: 32.3 pg (ref 26.0–34.0)
MCHC: 35 g/dL (ref 30.0–36.0)
MCV: 92.5 fL (ref 80.0–100.0)
Platelets: 141 10*3/uL — ABNORMAL LOW (ref 150–400)
RBC: 3.99 MIL/uL — ABNORMAL LOW (ref 4.22–5.81)
RDW: 12.9 % (ref 11.5–15.5)
WBC: 10.9 10*3/uL — ABNORMAL HIGH (ref 4.0–10.5)
nRBC: 0 % (ref 0.0–0.2)

## 2020-03-02 LAB — BASIC METABOLIC PANEL
Anion gap: 18 — ABNORMAL HIGH (ref 5–15)
BUN: 57 mg/dL — ABNORMAL HIGH (ref 6–20)
CO2: 23 mmol/L (ref 22–32)
Calcium: 8.9 mg/dL (ref 8.9–10.3)
Chloride: 96 mmol/L — ABNORMAL LOW (ref 98–111)
Creatinine, Ser: 8.67 mg/dL — ABNORMAL HIGH (ref 0.61–1.24)
GFR calc Af Amer: 7 mL/min — ABNORMAL LOW (ref >60)
GFR calc non Af Amer: 6 mL/min — ABNORMAL LOW (ref >60)
Glucose, Bld: 284 mg/dL — ABNORMAL HIGH (ref 70–99)
Potassium: 2.9 mmol/L — ABNORMAL LOW (ref 3.5–5.1)
Sodium: 137 mmol/L (ref 135–145)

## 2020-03-02 LAB — VITAMIN B12: Vitamin B-12: 895 pg/mL (ref 180–914)

## 2020-03-02 LAB — HIV ANTIBODY (ROUTINE TESTING W REFLEX): HIV Screen 4th Generation wRfx: NONREACTIVE

## 2020-03-02 LAB — HEPATITIS B SURFACE ANTIGEN: Hepatitis B Surface Ag: NONREACTIVE

## 2020-03-02 LAB — HEMOGLOBIN A1C
Hgb A1c MFr Bld: 8.6 % — ABNORMAL HIGH (ref 4.8–5.6)
Mean Plasma Glucose: 200.12 mg/dL

## 2020-03-02 MED ORDER — ALTEPLASE 2 MG IJ SOLR: 2.0000 mg | Freq: Once | INTRAMUSCULAR | Status: DC | PRN

## 2020-03-02 MED ORDER — HEPARIN SODIUM (PORCINE) 1000 UNIT/ML DIALYSIS
3800.0000 [IU] | INTRAMUSCULAR | Status: DC | PRN
  Filled 2020-03-02: qty 4

## 2020-03-02 MED ORDER — INSULIN ASPART PROT & ASPART (70-30 MIX) 100 UNIT/ML ~~LOC~~ SUSP
15.0000 [IU] | Freq: Two times a day (BID) | SUBCUTANEOUS | Status: DC
  Filled 2020-03-02: qty 10

## 2020-03-02 MED ORDER — SODIUM CHLORIDE 0.9 % IV SOLN: 100.0000 mL | INTRAVENOUS | Status: DC | PRN

## 2020-03-02 MED ORDER — SUCRALFATE 1 GM/10ML PO SUSP
1.0000 g | Freq: Three times a day (TID) | ORAL | Status: DC
  Administered 2020-03-03: 1 g via ORAL
  Filled 2020-03-02: qty 10

## 2020-03-02 MED ORDER — ZIPRASIDONE MESYLATE 20 MG IM SOLR
20.0000 mg | Freq: Once | INTRAMUSCULAR | Status: AC
  Administered 2020-03-02: 20 mg via INTRAMUSCULAR
  Filled 2020-03-02: qty 20

## 2020-03-02 MED ORDER — DEXMEDETOMIDINE HCL IN NACL 200 MCG/50ML IV SOLN
0.4000 ug/kg/h | INTRAVENOUS | Status: DC
  Filled 2020-03-02: qty 50

## 2020-03-02 MED ORDER — PANTOPRAZOLE SODIUM 40 MG IV SOLR
40.0000 mg | Freq: Two times a day (BID) | INTRAVENOUS | Status: DC
  Administered 2020-03-02: 40 mg via INTRAVENOUS
  Filled 2020-03-02: qty 40

## 2020-03-02 MED ORDER — METOCLOPRAMIDE HCL 10 MG PO TABS
5.0000 mg | ORAL_TABLET | Freq: Every day | ORAL | Status: DC
  Filled 2020-03-02: qty 1

## 2020-03-02 MED ORDER — CHLORHEXIDINE GLUCONATE CLOTH 2 % EX PADS
6.0000 | MEDICATED_PAD | Freq: Every day | CUTANEOUS | Status: DC
  Administered 2020-03-02: 6 via TOPICAL

## 2020-03-02 MED ORDER — DEXMEDETOMIDINE HCL IN NACL 400 MCG/100ML IV SOLN
0.4000 ug/kg/h | INTRAVENOUS | Status: DC
  Administered 2020-03-02: 0.4 ug/kg/h via INTRAVENOUS
  Administered 2020-03-02: 0.6 ug/kg/h via INTRAVENOUS
  Administered 2020-03-03: 0.4 ug/kg/h via INTRAVENOUS
  Filled 2020-03-02 (×3): qty 100

## 2020-03-02 MED ORDER — METOCLOPRAMIDE HCL 5 MG/ML IJ SOLN
10.0000 mg | Freq: Once | INTRAMUSCULAR | Status: AC
  Administered 2020-03-02: 10 mg via INTRAVENOUS
  Filled 2020-03-02: qty 2

## 2020-03-02 MED ORDER — HEPARIN SODIUM (PORCINE) 1000 UNIT/ML DIALYSIS: 1000.0000 [IU] | INTRAMUSCULAR | Status: DC | PRN

## 2020-03-02 MED ORDER — METOPROLOL TARTRATE 5 MG/5ML IV SOLN
5.0000 mg | Freq: Once | INTRAVENOUS | Status: AC
  Administered 2020-03-02: 5 mg via INTRAVENOUS
  Filled 2020-03-02: qty 5

## 2020-03-02 MED ORDER — ZIPRASIDONE MESYLATE 20 MG IM SOLR
20.0000 mg | Freq: Two times a day (BID) | INTRAMUSCULAR | Status: DC | PRN
  Filled 2020-03-02: qty 20

## 2020-03-02 MED ORDER — PANTOPRAZOLE SODIUM 40 MG PO TBEC: 40.0000 mg | DELAYED_RELEASE_TABLET | Freq: Two times a day (BID) | ORAL | Status: DC

## 2020-03-02 MED ORDER — LORAZEPAM 2 MG/ML IJ SOLN: 0.5000 mg | Freq: Once | INTRAMUSCULAR | Status: DC

## 2020-03-02 NOTE — Procedures (Signed)
     HEMODIALYSIS TREATMENT NOTE:   Cooperative with initiation of dialysis but quickly became agitated and attempted to get out of bed while still in soft wrist restraints.  Geodon 37m given IM by primary RN at 1209.  At 1230, pt was still attempting to get out of bed, pushing staff away with his feet.  Posey belt was also placed to safely complete dialysis.  Geodon eventually took effect and pt was cooperative for second half of treatment.   3.5 hour treatment completed.  Cath exit site unremarkable. Goal met: 2.5L removed without interruption in UF although rate was lowered once for c/o abd cramping.  All blood was returned.   ARockwell Alexandria RN

## 2020-03-02 NOTE — Progress Notes (Incomplete)
Nurse paged MD notifying pt is unmanageable with IV/IM pushes and request continuous sedation for pt safety; pending response

## 2020-03-02 NOTE — Consult Note (Signed)
Timothy Clark Admit Date: 03/01/2020 03/02/2020 Timothy Clark Requesting Physician:  Timothy Clark  Reason for Consult:  ESRD comanagement HPI:  49M admitted overnight with concern for upper GI bleed after single episode of coffee-ground emesis.  Has had recent evaluations at [facility] emergency room for chronic and acute abdominal pain.  Has a past history including ESRD on HD (outpatient records unclear, follows with [facility], using TDC), DM 2 with gastroparesis, hypertension, PVD, obesity, anxiety/depression, legal blindness.  At time of my evaluation patient is physically agitated and not following instructions.  Has required physical restraint.  When I walked into the room he had slipped out of the bed and his arms were still restrained.  Working with 2 other providers we put him back into the bed.  Writing HD orders for him today.  Does not provide any history.  Hemoglobin has been fairly stable, 12.9 today.  Was 14.1 at presentation.  Labs are notable for K of 2.9, bicarbonate 23.  Blood pressure is variable.  He is on room air.  Balance of 12 systems is unobtainable due to mental status   PMH  Past Medical History:  Diagnosis Date  . Anemia   . Anxiety   . CHF (congestive heart failure) (HCC)    diastolic dysfunction  . Chronic abdominal pain   . COVID-19 07/2019  . Depression   . Esophagitis   . ESRD on hemodialysis ([place])   . Eye hemorrhage   . Gastritis   . Gastroparesis   . GERD (gastroesophageal reflux disease)   . Glaucoma   . Helicobacter pylori (H. pylori) infection 2016   ERADICATION DOCUMENTED VIA STOOL TEST in AUG 2016 at [facility]  . Hiccups   . Hypertension   . Nausea and vomiting    chronic, recurrent  . Neuropathy   . Renal insufficiency   . Type 2 diabetes mellitus with complications (HCC)    diagnosed around age 13   [name]  Past Surgical History:  Procedure Laterality Date  . AMPUTATION TOE Right    great toe  . AV FISTULA INSERTION W/ RF MAGNETIC  GUIDANCE Left 04/06/2019   Procedure: AV FISTULA INSERTION W/RF MAGNETIC GUIDANCE;  Surgeon: Katha Cabal, MD;  Location: [place] [facility];  Service: Cardiovascular;  Laterality: Left;  . AV FISTULA PLACEMENT Left 05/06/2019   Procedure: INSERTION OF ARTERIOVENOUS (AV) GORE-TEX GRAFT ARM ( FOREARM LOOP );  Surgeon: Katha Cabal, MD;  Location: [facility];  Service: Vascular;  Laterality: Left;  . CATARACT EXTRACTION W/PHACO Left 05/19/2016   Procedure: CATARACT EXTRACTION PHACO AND INTRAOCULAR LENS PLACEMENT LEFT EYE;  Surgeon: Tonny Branch, MD;  Location: [facility];  Service: Ophthalmology;  Laterality: Left;  CDE: 7.30  . CATARACT EXTRACTION W/PHACO Right 06/23/2016   Procedure: CATARACT EXTRACTION PHACO AND INTRAOCULAR LENS PLACEMENT RIGHT EYE CDE=9.87;  Surgeon: Tonny Branch, MD;  Location: [facility];  Service: Ophthalmology;  Laterality: Right;  right  . DIALYSIS/PERMA CATHETER REMOVAL N/A 07/19/2019   Procedure: DIALYSIS/PERMA CATHETER REMOVAL;  Surgeon: Katha Cabal, MD;  Location: [place] [facility];  Service: Cardiovascular;  Laterality: N/A;  . ESOPHAGOGASTRODUODENOSCOPY  2015   Dr. Britta Mccreedy  . ESOPHAGOGASTRODUODENOSCOPY N/A 10/26/2014   RMR: Distal esophagititis-likely reflux related although an element of pill induced injuruy no exclueded  status post biopsy. Diffusely abnormal gastric mucosa of uncertain signigicane -status post gastric biopsy. Focal area of excoriation in the cardia most consistant with a trauma of heaving.   . ESOPHAGOGASTRODUODENOSCOPY  08/2015   [facility]: mild esophagitis and old gastric contents  . EYE SURGERY  2015   stent placed to left eye  . EYE SURGERY  06/2019   per patient- to remove scar tissue  . INCISION AND DRAINAGE OF WOUND Right 07/16/2017   Procedure: IRRIGATION AND DEBRIDEMENT OF SOFT TISSUE OF ULCERATION RIGHT FOOT;  Surgeon: Caprice Beaver, DPM;  Location: [facility];  Service: Podiatry;  Laterality: Right;  . PERIPHERAL VASCULAR  THROMBECTOMY Left 09/27/2019   Procedure: PERIPHERAL VASCULAR THROMBECTOMY;  Surgeon: Katha Cabal, MD;  Location: [place] [facility];  Service: Cardiovascular;  Laterality: Left;  . PERIPHERAL VASCULAR THROMBECTOMY Left 10/27/2019   Procedure: PERIPHERAL VASCULAR THROMBECTOMY;  Surgeon: Algernon Huxley, MD;  Location: [place] [facility];  Service: Cardiovascular;  Laterality: Left;  . PERIPHERAL VASCULAR THROMBECTOMY Left 12/13/2019   Procedure: PERIPHERAL VASCULAR THROMBECTOMY;  Surgeon: Katha Cabal, MD;  Location: [place] [facility];  Service: Cardiovascular;  Laterality: Left;   FH  Family History  Problem Relation Age of Onset  . Ovarian cancer Mother   . Heart attack Father   . Colon polyps Father   . Cervical cancer Sister   . Diabetes Sister   . Colon cancer Neg Hx    SH  reports that he has never smoked. He has never used smokeless tobacco. He reports that he does not drink alcohol and does not use drugs. Allergies  Allergies  Allergen Reactions  . Donnatal [Phenobarbital-Belladonna Alk] Anaphylaxis and Other (See Comments)    Reaction to GI cocktail  . Fish Allergy Anaphylaxis, Hives and Rash  . Haldol [Haloperidol Lactate] Other (See Comments)    Chest Pain  . Maalox [Calcium Carbonate Antacid] Anaphylaxis and Other (See Comments)    Reaction to GI cocktail  . Phenobarbital-Belladonna Alk [Pb-Hyoscy-Atropine-Scopolamine] Anaphylaxis    Reaction to GI cocktail Other reaction(s): Other (See Comments), Other (See Comments) Reaction to GI cocktail Reaction to GI cocktail Reaction to GI cocktail  . Shellfish Allergy Hives    Per patient: Patient stated he breaks out in hives when eating shellfish  . Marinol [Dronabinol] Nausea And Vomiting and Other (See Comments)    Caused worsening vomiting.   . Aspirin Hives and Itching  . Bactrim [Sulfamethoxazole-Trimethoprim] Itching  . Keflex [Cephalexin] Hives, Itching and Nausea And Vomiting  . Penicillins Hives,  Itching and Other (See Comments)    Has patient had a PCN reaction causing immediate rash, facial/tongue/throat swelling, SOB or lightheadedness with hypotension: yes Has patient had a PCN reaction causing severe rash involving mucus membranes or skin necrosis: yes Has patient had a PCN reaction that required hospitalization no Has patient had a PCN reaction occurring within the last 10 years: yes If all of the above answers are "NO", then may proceed with Cephalospor   Home medications Prior to Admission medications   Medication Sig Start Date End Date Taking? Authorizing Provider  ALPRAZolam Duanne Moron) 0.5 MG tablet Take 0.5 mg by mouth 2 (two) times daily as needed.    Yes [provider]  amLODipine (NORVASC) 5 MG tablet Take 5 mg by mouth daily.   Yes [provider]  atorvastatin (LIPITOR) 10 MG tablet Take 10 mg by mouth at bedtime.    Yes [provider]  brimonidine (ALPHAGAN) 0.15 % ophthalmic solution Place 1 drop into both eyes in the morning and at bedtime.    Yes [provider]  carvedilol (COREG) 25 MG tablet Take 25 mg by mouth 2 (  two) times daily with a meal.   Yes [provider]  cloNIDine (CATAPRES) 0.2 MG tablet Take 0.2 mg by mouth daily.    Yes [provider]  cyanocobalamin (,VITAMIN B-12,) 1000 MCG/ML injection Inject 1,000 mcg into the muscle every 30 (thirty) days.  04/19/18  Yes [provider]  dorzolamide-timolol (COSOPT) 22.3-6.8 MG/ML ophthalmic solution  01/16/20  Yes [provider]  folic acid (FOLVITE) 1 MG tablet Take 1 mg by mouth daily.   Yes [provider]  metoCLOPramide (REGLAN) 5 MG tablet TAKE ONE TABLET BY MOUTH BEFORE BREAKFAST 01/04/20  Yes Harper, Kristen S, PA-C  NOVOLOG MIX 70/30 FLEXPEN (70-30) 100 UNIT/ML FlexPen 35-55 Units by Subconjunctival route See admin instructions. Inject 55 units subcutaneously in the morning & inject 35 units subcutaneously in the evening.  06/21/19  Yes [provider]  pantoprazole (PROTONIX) 40 MG tablet Take 1 tablet (40 mg total) by mouth 2 (two) times daily before a meal. 08/24/18  Yes Annitta Needs, NP  promethazine (PHENERGAN) 25 MG tablet Take 12.5 mg by mouth every 6 (six) hours as needed for nausea or vomiting.    Yes [provider]  sertraline (ZOLOFT) 50 MG tablet Take 50 mg by mouth daily.   Yes [provider]  sitaGLIPtin (JANUVIA) 25 MG tablet Take 25 mg by mouth daily.    Yes [provider]  sucralfate (CARAFATE) 1 g tablet Take 1 g by mouth 4 (four) times daily.    Yes [provider]  torsemide (DEMADEX) 20 MG tablet Take 40 mg by mouth daily.    Yes [provider]  insulin NPH Human (NOVOLIN N) 100 UNIT/ML injection Inject into the skin. Patient not taking: Reported on 01/26/2020    [provider]  timolol (TIMOPTIC) 0.5 % ophthalmic solution Place 1 drop into both eyes 2 (two) times daily. Patient not taking: Reported on 03/01/2020    [provider]    Current Medications Scheduled Meds: . amLODipine  5 mg Oral Daily  . brimonidine  1 drop Both Eyes TID  . cloNIDine  0.2 mg Oral Daily  . dorzolamide-timolol  1 drop Both Eyes BID  . insulin aspart  0-9 Units Subcutaneous TID WC  . insulin aspart protamine- aspart  5 Units Subcutaneous BID WC   Continuous Infusions: . pantoprozole (PROTONIX) infusion 8 mg/hr (03/01/20 2340)   PRN Meds:.ALPRAZolam  CBC Recent Labs  Lab 03/01/20 2120 03/02/20 0612  WBC 11.6* 10.9*  NEUTROABS 8.9*  --   HGB 14.1 12.9*  HCT 40.7 36.9*  MCV 94.4 92.5  PLT 150 831*   Basic Metabolic Panel Recent Labs  Lab 03/01/20 2120 03/02/20 0612  NA 135 137  K 3.1* 2.9*  CL 94* 96*  CO2 21* 23  GLUCOSE 277* 284*  BUN 49* 57*  CREATININE 8.06* 8.67*  CALCIUM 9.3 8.9    Physical Exam  Blood pressure (!) 148/80, pulse 79, temperature 98.6 F (37 C), temperature source Oral, resp. rate 16,  height '5\' 11"'  (1.803 m), weight (!) 124.5 kg, SpO2 98 %. GEN: Not following commands, NAD, agitated ENT: NCAT CV: Regular, normal S1 and S2 PULM: Clear bilaterally ABD: s/nt/nd SKIN: no rashes/lesions VASC: R IJ TDC EXT:No LEE  Assessment 58M ESRD MWF TDC with [facility] (more info not known) with ? UGIB and chornic abd pain.    1. ESRD MWF TDC 2. ?UGIB, on IV PPI. GI to see. Hb fairly stable 3. Agitation, per [facility]  4. HTN 5. DM2 6. Chronic abd pain  Plan 1. HD today: 3.5h, 2-3L UF, TDC, 4K, no heparin 2. Will need potential chemical sedation for HD if agitation persists 3. Daily weights, Daily Renal Panel, Strict I/Os, Avoid nephrotoxins (NSAIDs, judicious IV Contrast)    Timothy Clark  03/02/2020, 9:48 AM

## 2020-03-02 NOTE — Progress Notes (Signed)
Patient cont to ambulate in room and hallway without assistance. Patient states he is seeking water. Two nurses explain to him they he is NPO status and the risk of eating or drinking. Mouth/lip  moisturizer offered and refused. Patient continue to ambulate in room and walking into things in the room. Staff attempts to redirect patient and assist patient back to bed several times. Patient gait is unsteady. When staff attempt to steady patient, patient swings at staff.  On-call clinician notified and gave order for soft restraints. PRNs and redirection with conversation was attempted before restraints applied.

## 2020-03-02 NOTE — Progress Notes (Signed)
Inpatient Diabetes Program Recommendations  AACE/ADA: New Consensus Statement on Inpatient Glycemic Control (2015)  Target Ranges:  Prepandial:   less than 140 mg/dL      Peak postprandial:   less than 180 mg/dL (1-2 hours)      Critically ill patients:  140 - 180 mg/dL   Lab Results  Component Value Date   GLUCAP 296 (H) 03/02/2020   HGBA1C 8.6 (H) 03/01/2020    Review of Glycemic Control Results for [name], Timothy Clark (MRN [id number]) as of 03/02/2020 13:16  Ref. Range 03/01/2020 20:33 03/02/2020 07:49  Glucose-Capillary Latest Ref Range: 70 - 99 mg/dL 272 (H) 296 (H)   Diabetes history: DM 2 Outpatient Diabetes medications:  Novolog 70/30 mix 55 units in the AM and 35 units in the evening Current orders for Inpatient glycemic control:  Novolog sensitive tid with meals Novolog 70/30 mix to 5 units bid Inpatient Diabetes Program Recommendations:     If appropriate, increase Novolog 70/30 to 10 units bid.   Thanks  Adah Perl, RN, BC-ADM Inpatient Diabetes Coordinator Pager [phone] (8a-5p)

## 2020-03-02 NOTE — Consult Note (Addendum)
Gastroenterology attending attestation note: I personally reviewed the medical chart and evaluated the patient.  Briefly, Timothy Clark is a 49 y.o. male with a past medical history of anemia, CHF, esophagitis, GERD, gastroparesis, H. pylori infection status post eradication documented in August 2016, ESRD on hemodialysis, who was referred to our hospital after presenting with new episodes of coffee-ground emesis.  Per the patient he has presented episodes of nausea and vomiting with some retching recently after which he presented a few episodes of coffee-ground emesis yesterday.  Per review of his lab work-up he is presenting mild anemia With a hemoglobin of 12.9 which dropped from 14.1 yesterday.  However, upon review of his chart, on 02/16/20 he was found to have a hemoglobin of 12.5.  His BUN is unchanged compared to prior.  The patient reported not having any more episodes of vomiting today but is still having some retching as he has not received his Reglan dose today.  He has not had any bowel movements since yesterday.He has not been taking any NSAIDs or anticoagulants.  He is taking a PPI every day once a day.  He endorse having some abdominal pain in his upper abdomen due to the retching. His most recent EGD was performed in 2016 which showed distal esophagitis possibly due to pill induced injury or reflux.  There was a focal area of excoriation in the cardia.   At this point, his clinical presentation is most consistent with an upper gastrointestinal bleeding, likely due to Mallory-Weiss tear versus esophagitis given the fact that the patient has been retching multiple times.  However, he has remained hemodynamically stable and has not presented any significant drop in his hemoglobin compared to 3 weeks ago.  Regardless, his anemia is very mild and is actually at goal for a patient that has end-stage renal disease.  Overall, the most important thing will be to control his gastroparesis at this time  increasing his Reglan dosing to twice daily 5 mg.  He will need to follow-up as outpatient to optimize his regimen as much as possible.  He may need to use an IV formulation today as he is able to tolerate diet adequately.  Another option will be to consider azithromycin as a second line therapy.  We will not proceed with an endoscopic procedure at the moment given the fact he has remained stable.  He needs to continue with his PPI IV for now and with close monitoring of his H&H every 24 hours.  Harvel Quale, MD Gastroenterology and Hepatology [facility]   Referring Provider: [facility] Primary Care Physician:  [name], MD Primary Gastroenterologist:  Dr.  Date of Admission:  Date of Consultation:   Reason for Consultation:  Coffee ground emesis  HPI:  Timothy Clark is a 49 y.o. male with a past medical history of anemia, CHF, chronic abdominal pain, esophagitis, GERD, gastroparesis, H. pylori infection status post eradication documented in August 2016, ESRD on hemodialysis.  The patient was last seen in our office 12/08/2019 for follow-up on his chronic GI issues.  Previous endoscopic evaluation consistent with gastroparesis.  Right upper quadrant ultrasound March 2019 was normal and CT in November 2019 with fatty liver and gallbladder sludge.  HIDA scan normal in June 2020.  No previous colonoscopy.  At that time he noted he did have COVID-19 in December 2020.  Previous admissions for DKA noted.  A1c improved to 8 with insulin adjustment.  At the time of  his last visit denied nausea, vomiting, abdominal pain, GERD symptoms, dysphagia.  Weight has been stable and appetite good.  Continues on Reglan once daily and Protonix twice daily as well as Carafate.  Had not recently needed Phenergan.  However, at the end of the visit patient did vomit into the trash can.  Requested to discuss colonoscopy at his next visit, although he had been  feeling poorly since receiving his COVID-19 vaccine.  Recommended continue Reglan and Protonix, Phenergan as needed.  Can stop Carafate and follow-up in 6 months.  The patient presented to the emergency department yesterday referred by [facility] for coffee-ground emesis.  At presentation at [facility] he was having abdominal pain and nausea with multiple ED visits in the past week.  CT of the abdomen and pelvis reportedly unremarkable.  While preparing for discharge from the ED he had an episode of coffee-ground emesis.  Denied NSAID use or blood thinners.  No ASA powders.  On presentation to the ED his hemoglobin was 13.9 (before hematemesis) and INR was normal.  Noted to still be on Protonix twice daily.  No recurrence of vomiting since admission to [facility].  After admission to [facility] his hemoglobin was 14.1 but this is declined to 12.9 this morning.  Potassium noted to be low at 2.9, creatinine significantly elevated as expected on hemodialysis.  Lipase normal.  When I walked into the room I found the patient half out of the bed. He was assisted back in bed with the assitance of 2 other persons. He complains of left shoulder pain so the hospitalist was paged. Today he states he's hungry and thirsty and wants to be able to eat/drink. Is scheduled for HD today. Has been having a lot of N/V recently. One episode of coffee-ground emesis at [facility], no further vomiting since admission to [facility]. Dneies nausea today. States he's been compliant with his PPI, has not been taking any NSAIDs/ASA powders. Denies ETOH intake. Has some mild epigastric and general abdominal discomfort. No other overt GI complaints,  Past Medical History:  Diagnosis Date   Anemia    Anxiety    CHF (congestive heart failure) (HCC)    diastolic dysfunction   Chronic abdominal pain    COVID-19 07/2019   Depression    Esophagitis    ESRD on hemodialysis ([place])    Eye hemorrhage    Gastritis     Gastroparesis    GERD (gastroesophageal reflux disease)    Glaucoma    Helicobacter pylori (H. pylori) infection 2016   ERADICATION DOCUMENTED VIA STOOL TEST in AUG 2016 at [facility]   Hiccups    Hypertension    Nausea and vomiting    chronic, recurrent   Neuropathy    Renal insufficiency    Type 2 diabetes mellitus with complications ([place])    diagnosed around age 23    Past Surgical History:  Procedure Laterality Date   AMPUTATION TOE Right    great toe   AV FISTULA INSERTION W/ RF MAGNETIC GUIDANCE Left 04/06/2019   Procedure: AV FISTULA INSERTION W/RF MAGNETIC GUIDANCE;  Surgeon: Katha Cabal, MD;  Location: [place] [facility];  Service: Cardiovascular;  Laterality: Left;   AV FISTULA PLACEMENT Left 05/06/2019   Procedure: INSERTION OF ARTERIOVENOUS (AV) GORE-TEX GRAFT ARM ( FOREARM LOOP );  Surgeon: Katha Cabal, MD;  Location: [facility];  Service: Vascular;  Laterality: Left;   CATARACT EXTRACTION W/PHACO Left 05/19/2016   Procedure: CATARACT EXTRACTION PHACO AND  INTRAOCULAR LENS PLACEMENT LEFT EYE;  Surgeon: Tonny Branch, MD;  Location: [facility];  Service: Ophthalmology;  Laterality: Left;  CDE: 7.30   CATARACT EXTRACTION W/PHACO Right 06/23/2016   Procedure: CATARACT EXTRACTION PHACO AND INTRAOCULAR LENS PLACEMENT RIGHT EYE CDE=9.87;  Surgeon: Tonny Branch, MD;  Location: [facility];  Service: Ophthalmology;  Laterality: Right;  right   DIALYSIS/PERMA CATHETER REMOVAL N/A 07/19/2019   Procedure: DIALYSIS/PERMA CATHETER REMOVAL;  Surgeon: Katha Cabal, MD;  Location: [place] [facility];  Service: Cardiovascular;  Laterality: N/A;   ESOPHAGOGASTRODUODENOSCOPY  2015   Dr. Britta Mccreedy   ESOPHAGOGASTRODUODENOSCOPY N/A 10/26/2014   RMR: Distal esophagititis-likely reflux related although an element of pill induced injuruy no exclueded  status post biopsy. Diffusely abnormal gastric mucosa of uncertain signigicane -status post gastric biopsy. Focal area of excoriation  in the cardia most consistant with a trauma of heaving.    ESOPHAGOGASTRODUODENOSCOPY  08/2015   [facility]: mild esophagitis and old gastric contents   EYE SURGERY  2015   stent placed to left eye   EYE SURGERY  06/2019   per patient- to remove scar tissue   INCISION AND DRAINAGE OF WOUND Right 07/16/2017   Procedure: IRRIGATION AND DEBRIDEMENT OF SOFT TISSUE OF ULCERATION RIGHT FOOT;  Surgeon: Caprice Beaver, DPM;  Location: [facility];  Service: Podiatry;  Laterality: Right;   PERIPHERAL VASCULAR THROMBECTOMY Left 09/27/2019   Procedure: PERIPHERAL VASCULAR THROMBECTOMY;  Surgeon: Katha Cabal, MD;  Location: [place] [facility];  Service: Cardiovascular;  Laterality: Left;   PERIPHERAL VASCULAR THROMBECTOMY Left 10/27/2019   Procedure: PERIPHERAL VASCULAR THROMBECTOMY;  Surgeon: Algernon Huxley, MD;  Location: [place] [facility];  Service: Cardiovascular;  Laterality: Left;   PERIPHERAL VASCULAR THROMBECTOMY Left 12/13/2019   Procedure: PERIPHERAL VASCULAR THROMBECTOMY;  Surgeon: Katha Cabal, MD;  Location: [place] [facility];  Service: Cardiovascular;  Laterality: Left;    Prior to Admission medications   Medication Sig Start Date End Date Taking? Authorizing Provider  ALPRAZolam Duanne Moron) 0.5 MG tablet Take 0.5 mg by mouth 2 (two) times daily as needed.    Yes [provider]  amLODipine (NORVASC) 5 MG tablet Take 5 mg by mouth daily.   Yes [provider]  atorvastatin (LIPITOR) 10 MG tablet Take 10 mg by mouth at bedtime.    Yes [provider]  brimonidine (ALPHAGAN) 0.15 % ophthalmic solution Place 1 drop into both eyes in the morning and at bedtime.    Yes [provider]  carvedilol (COREG) 25 MG tablet Take 25 mg by mouth 2 (two) times daily with a meal.   Yes [provider]  cloNIDine (CATAPRES) 0.2 MG tablet Take 0.2 mg by mouth daily.    Yes [provider]  cyanocobalamin (,VITAMIN B-12,) 1000 MCG/ML  injection Inject 1,000 mcg into the muscle every 30 (thirty) days.  04/19/18  Yes [provider]  dorzolamide-timolol (COSOPT) 22.3-6.8 MG/ML ophthalmic solution  01/16/20  Yes [provider]  folic acid (FOLVITE) 1 MG tablet Take 1 mg by mouth daily.   Yes [provider]  metoCLOPramide (REGLAN) 5 MG tablet TAKE ONE TABLET BY MOUTH BEFORE BREAKFAST 01/04/20  Yes Harper, Kristen S, PA-C  NOVOLOG MIX 70/30 FLEXPEN (70-30) 100 UNIT/ML FlexPen 35-55 Units by Subconjunctival route See admin instructions. Inject 55 units subcutaneously in the morning & inject 35 units subcutaneously in the evening. 06/21/19  Yes [provider]  pantoprazole (PROTONIX) 40 MG tablet Take 1 tablet (40 mg total)  by mouth 2 (two) times daily before a meal. 08/24/18  Yes Annitta Needs, NP  promethazine (PHENERGAN) 25 MG tablet Take 12.5 mg by mouth every 6 (six) hours as needed for nausea or vomiting.    Yes [provider]  sertraline (ZOLOFT) 50 MG tablet Take 50 mg by mouth daily.   Yes [provider]  sitaGLIPtin (JANUVIA) 25 MG tablet Take 25 mg by mouth daily.    Yes [provider]  sucralfate (CARAFATE) 1 g tablet Take 1 g by mouth 4 (four) times daily.    Yes [provider]  torsemide (DEMADEX) 20 MG tablet Take 40 mg by mouth daily.    Yes [provider]  insulin NPH Human (NOVOLIN N) 100 UNIT/ML injection Inject into the skin. Patient not taking: Reported on 01/26/2020    [provider]  timolol (TIMOPTIC) 0.5 % ophthalmic solution Place 1 drop into both eyes 2 (two) times daily. Patient not taking: Reported on 03/01/2020    [provider]    Current Facility-Administered Medications  Medication Dose Route Frequency Provider Last Rate Last Admin   ALPRAZolam Duanne Moron) tablet 0.5 mg  0.5 mg Oral BID PRN Elgergawy, Silver Huguenin, MD   0.5 mg at 03/01/20 2338   amLODipine (NORVASC) tablet 5 mg  5 mg Oral Daily Elgergawy,  Silver Huguenin, MD   5 mg at 03/01/20 2338   brimonidine (ALPHAGAN) 0.15 % ophthalmic solution 1 drop  1 drop Both Eyes TID Elgergawy, Silver Huguenin, MD   1 drop at 03/02/20 0031   cloNIDine (CATAPRES) tablet 0.2 mg  0.2 mg Oral Daily Elgergawy, Silver Huguenin, MD       dorzolamide-timolol (COSOPT) 22.3-6.8 MG/ML ophthalmic solution 1 drop  1 drop Both Eyes BID Elgergawy, Silver Huguenin, MD   1 drop at 03/02/20 0031   insulin aspart (novoLOG) injection 0-9 Units  0-9 Units Subcutaneous TID WC Elgergawy, Silver Huguenin, MD       insulin aspart protamine- aspart (NOVOLOG MIX 70/30) injection 5 Units  5 Units Subcutaneous BID WC Elgergawy, Silver Huguenin, MD       pantoprazole (PROTONIX) 80 mg in sodium chloride 0.9 % 100 mL (0.8 mg/mL) infusion  8 mg/hr Intravenous Continuous Elgergawy, Silver Huguenin, MD 10 mL/hr at 03/01/20 2340 8 mg/hr at 03/01/20 2340    Allergies as of 03/01/2020 - Review Complete 02/07/2020  Allergen Reaction Noted   Donnatal [phenobarbital-belladonna alk] Anaphylaxis and Other (See Comments) 07/12/2015   Fish allergy Anaphylaxis, Hives, and Rash 01/14/2015   Haldol [haloperidol lactate] Other (See Comments) 08/07/2015   Maalox [calcium carbonate antacid] Anaphylaxis and Other (See Comments) 07/12/2015   Phenobarbital-belladonna alk [pb-hyoscy-atropine-scopolamine] Anaphylaxis 07/12/2015   Shellfish allergy Hives 09/27/2019   Marinol [dronabinol] Nausea And Vomiting and Other (See Comments) 05/28/2018   Aspirin Hives and Itching 03/22/2009   Bactrim [sulfamethoxazole-trimethoprim] Itching 12/06/2014   Keflex [cephalexin] Hives, Itching, and Nausea And Vomiting 06/22/2017   Penicillins Hives, Itching, and Other (See Comments) 03/22/2009    Family History  Problem Relation Age of Onset   Ovarian cancer Mother    Heart attack Father    Colon polyps Father    Cervical cancer Sister    Diabetes Sister    Colon cancer Neg Hx     Social History   Socioeconomic History   Marital  status: Single    Spouse name: Not on file   Number of children: Not on file   Years of education: Not on file  Highest education level: Not on file  Occupational History   Not on file  Tobacco Use   Smoking status: Never Smoker   Smokeless tobacco: Never Used  Vaping Use   Vaping Use: Never used  Substance and Sexual Activity   Alcohol use: No    Alcohol/week: 0.0 standard drinks   Drug use: No   Sexual activity: Yes  Other Topics Concern   Not on file  Social History Narrative   Lives with dad and sister   Social Determinants of Health   Financial Resource Strain:    Difficulty of Paying Living Expenses:   Food Insecurity:    Worried About Charity fundraiser in the Last Year:    Arboriculturist in the Last Year:   Transportation Needs:    Film/video editor (Medical):    Lack of Transportation (Non-Medical):   Physical Activity:    Days of Exercise per Week:    Minutes of Exercise per Session:   Stress:    Feeling of Stress :   Social Connections:    Frequency of Communication with Friends and Family:    Frequency of Social Gatherings with Friends and Family:    Attends Religious Services:    Active Member of Clubs or Organizations:    Attends Music therapist:    Marital Status:   Intimate Partner Violence:    Fear of Current or Ex-Partner:    Emotionally Abused:    Physically Abused:    Sexually Abused:     Review of Systems: General: Negative for anorexia, weight loss, fever, chills, fatigue, weakness. ENT: Negative for hoarseness, difficulty swallowing , nasal congestion. CV: Negative for chest pain, angina, palpitations, dyspnea on exertion, peripheral edema.  Respiratory: Negative for dyspnea at rest, dyspnea on exertion, cough, sputum, wheezing.  GI: See history of present illness. MS: Admits left should pain.  Derm: Negative for rash or itching.   Endo: Negative for unusual weight change.  Heme:  Negative for bruising or bleeding other than per HPI.  Physical Exam: Vital signs in last 24 hours: Temp:  [98.6 F (37 C)-99.1 F (37.3 C)] 98.6 F (37 C) (07/23 0704) Pulse Rate:  [79-101] 79 (07/23 0704) Resp:  [16-18] 16 (07/23 0704) BP: (116-182)/(79-95) 148/80 (07/23 0704) SpO2:  [98 %-100 %] 98 % (07/23 0704) Weight:  [124.5 kg] 124.5 kg (07/22 1756) Last BM Date: 03/01/20 General:   Alert,  Well-developed, well-nourished, pleasant and cooperative in NAD Head:  Normocephalic and atraumatic. Eyes:  Sclera clear, no icterus. Conjunctiva pink. Ears:  Normal auditory acuity. Neck:  Supple; no masses or thyromegaly. Lungs:  Clear throughout to auscultation.   No wheezes, crackles, or rhonchi. No acute distress. Heart:  Regular rate and rhythm; no murmurs, clicks, rubs,  or gallops. Abdomen:  Soft and nondistended. Mild abdominal TTP. No masses, hepatosplenomegaly or hernias noted. Normal bowel sounds, without guarding, and without rebound.   Rectal:  Deferred until time of colonoscopy.   Msk:  Specifically no gross deformity to LUE.. Pulses:  Normal bilateral DP pulses noted. Extremities:  Without clubbing or edema. Neurologic:  Alert and  oriented x4;  grossly normal neurologically. Psych:  Alert and cooperative. Normal mood and affect.  Intake/Output from previous day: 07/22 0701 - 07/23 0700 In: 56.1 [I.V.:56.1] Out: -  Intake/Output this shift: No intake/output data recorded.  Lab Results: Recent Labs    03/01/20 2120 03/02/20 0612  WBC 11.6* 10.9*  HGB 14.1 12.9*  HCT 40.7  36.9*  PLT 150 141*   BMET Recent Labs    03/01/20 2120 03/02/20 0612  NA 135 137  K 3.1* 2.9*  CL 94* 96*  CO2 21* 23  GLUCOSE 277* 284*  BUN 49* 57*  CREATININE 8.06* 8.67*  CALCIUM 9.3 8.9   LFT Recent Labs    03/01/20 2120  PROT 8.8*  ALBUMIN 4.7  AST 25  ALT 20  ALKPHOS 55  BILITOT 1.1  BILIDIR 0.2  IBILI 0.9   PT/INR Recent Labs    03/01/20 2120  LABPROT 14.5   INR 1.2   Hepatitis Panel No results for input(s): HEPBSAG, HCVAB, HEPAIGM, HEPBIGM in the last 72 hours. C-Diff No results for input(s): CDIFFTOX in the last 72 hours.  Studies/Results: No results found.  Impression: Oriented 49 year old male with extensive PMH as noted above.  The patient had episode of coffee-ground emesis witnessed at [facility].  His hemoglobin has apparently dropped about a gram since he arrived.  Anemia- overall I feel because he is due for dialysis today his anemia is likely more delutional.  He has not had any further coffee-ground emesis.  Denies NSAIDs and aspirin powders.  Denies any recent melena.  Not even nauseated today.  Overall he would likely benefit from EGD as an outpatient but I do not feel there is an urgent need for while he is admitted.  Coffee-ground emesis- the patient notes he has been having a lot of vomiting recently.  Denies melena or other coffee-ground emesis other than as witnessed at [facility], as per above.  Most likely consistent with Mallory-Weiss tear or gastric excoriation from frequent heaving; he interestingly has a history of reflux esophagitis and similar excoriation on EGD in 2016.  I do not feel he is acutely bleeding at this time.  Plan: 1. Continue PPI, Carafate, Reglan 2. Anticipate discharge in next 24 to 48 hours 3. No need for inpatient endoscopic evaluation 4. Follow-up in our office in 4 weeks 5. Okay for him to have oral intake 6. Supportive measures   Thank you for allowing [place] to participate in the care of [place]  Walden Field, DNP, AGNP-C Adult & Gerontological Nurse Practitioner [facility]   LOS: 1 day     03/02/2020, 8:20 AM

## 2020-03-02 NOTE — Progress Notes (Signed)
PROGRESS NOTE    Timothy Clark  PZW:[id number] DOB: [birth year] DOA: 03/01/2020 PCP: [name], MD   Chief complaint: Metabolic encephalopathy/hematemesis.  Brief Narrative:  As per H&P written by Dr. Waldron Labs on 03/01/2020 Timothy Clark  is a 49 y.o. male,with a history of diabetes mellitus type 2, gastroparesis, hypertension, ESRD on hemodialysis Monday Wednesday Friday, peripheral vascular disease, morbid obesity, anxiety/depression, chronic abdominal pain, gastroparesis, patient was sent from [facility] for coffee-ground emesis. -Patient presents to ED and [facility] secondary to complaint of abdominal pain, nausea, this appears to be chronic problem, per the record this is a third visit to the ED (multiple facilities) in the last 7 days, patient has downtrending lipase,, patient had CT abdomen and pelvis which was reportedly unremarkable, patient was being prepared for discharge, then he had an episode of coffee-ground emesis, denies any NSAID use, aspirin, or any blood thinners or Goody's powder, he denies any previous episodes, his hemoglobin was 13.9 on admission to ED (this is a prior to his vomiting, no repeat labs has been done since then, reportedly his vital signs were stable, and his INR was normal).  Patient is following with local GI Dr. Gala Romney, so in order to facilitate continuity of care and HD, transfer requested to [facility].  Assessment & Plan: 1-coffee-ground emesis -One episode after multiple belching/retching symptoms -Prior endoscopic evaluation demonstrating esophagitis and gastritis -Possible presentation due to Mallory-Weiss. -Hemodynamically stable overall with a stable hemoglobin level, no further episodes of hematemesis or vomiting. -Continue to have dry heaving. -Case discussed with gastroenterology service who felt no need for endoscopic evaluation at this time. -Okay to advance diet, continue the use of Reglan, continue as needed antiemetics and resume  the use of PPI twice a day and Carafate. -Follow clinical response and hemoglobin trend.  2-metabolic encephalopathy -With concerns for uremia; patient is due for hemodialysis. -Will also check ammonia level, TSH and B12 -Continue constant reorientation -Minimize the use of sedative agents.  3-Essential hypertension -BP fairly controlled -will monitor after HD and slowly resume home meds.  4-Chronic abdominal pain/Gastroparesis -continue reglan and PPI -continue PRN analgesics.  5-ESRD on dialysis [facility]) -Continue hemodialysis service as per nephrology service recommendations. -Patient with hypokalemia; continue monitoring on telemetry and replace with dialysis. -follow electrolytes trend in am.  6-Controlled type 2 diabetes mellitus with left eye affected by proliferative retinopathy with traction retinal detachment not involving macula, with long-term current use of insulin (HCC) -Continue sliding scale insulin and [address] [place] -Modified carbohydrate diet has been ordered.  7-class II obesity -Low calorie diet and portion control discussed with patient. -Body mass index is 38.37 kg/m.  DVT prophylaxis: SCDs Code Status: Full code. Family Communication: No family at bedside. Disposition:   Status is: Inpatient  Dispo: The patient is from: Home              Anticipated d/c is to: Home              Anticipated d/c date is: 03/03/20              Patient currently stable for discharge.  Continued to be intermittently confused, difficult to redirect at and demonstrating signs of metabolic encephalopathy.  No further episode of coffee-ground emesis.  Hemoglobin has remained stable.  Per GI service recommendation no need for endoscopic evaluation currently.    Consultants:   Renal service  Gastroenterology service.   Procedures:  See below for x-ray reports.   Antimicrobials:  None  Subjective: Continued to be intermittently confused; no chest pain, no shortness of  breath, denies nausea and has not had any further episode of hematemesis.  Objective: Vitals:   03/02/20 1300 03/02/20 1330 03/02/20 1400 03/02/20 1430  BP: (!) 164/79 (!) 160/78 (!) 154/82 (!) 142/84  Pulse: 104 102 104 100  Resp:      Temp:      TempSrc:      SpO2:      Weight:      Height:        Intake/Output Summary (Last 24 hours) at 03/02/2020 1535 Last data filed at 03/02/2020 0444 Gross per 24 hour  Intake 56.05 ml  Output --  Net 56.05 ml   Filed Weights   03/01/20 1756 03/02/20 1110  Weight: (!) 124.5 kg (!) 124.8 kg    Examination:  General exam: No chest pain, no nausea, no active vomiting.  With ongoing belching/retching episodes.  Intermittently confused and difficult to redirect.  Patient ended requiring soft restraints overnight.  Afebrile. Respiratory system: Clear to auscultation. Respiratory effort normal. Cardiovascular system: S1 & S2 heard, RRR. No JVD, murmurs, rubs, gallops or clicks. No pedal edema. Gastrointestinal system: Abdomen is obese, nondistended, soft and nontender. No organomegaly or masses felt. Normal bowel sounds heard. Central nervous system: Legally blind; no focal neurological deficits.  Able to move 4 limbs spontaneously. Extremities: No cyanosis or clubbing. Skin: No rashes, no petechiae.  Chronic lower extremities stasis dermatitis appreciated. Psychiatry: Patient oriented x3 intermittently; poor judgment and insight demonstrated.  Difficult to redirect and experiencing episodes of agitation.    Data Reviewed: I have personally reviewed following labs and imaging studies  CBC: Recent Labs  Lab 03/01/20 2120 03/02/20 0612  WBC 11.6* 10.9*  NEUTROABS 8.9*  --   HGB 14.1 12.9*  HCT 40.7 36.9*  MCV 94.4 92.5  PLT 150 141*    Basic Metabolic Panel: Recent Labs  Lab 03/01/20 2120 03/02/20 0612  NA 135 137  K 3.1* 2.9*  CL 94* 96*  CO2 21* 23  GLUCOSE 277* 284*  BUN 49* 57*  CREATININE 8.06* 8.67*  CALCIUM 9.3 8.9     GFR: Estimated Creatinine Clearance: 13.7 mL/min (A) (by C-G formula based on SCr of 8.67 mg/dL (H)).  Liver Function Tests: Recent Labs  Lab 03/01/20 2120  AST 25  ALT 20  ALKPHOS 55  BILITOT 1.1  PROT 8.8*  ALBUMIN 4.7    CBG: Recent Labs  Lab 03/01/20 2033 03/02/20 0749  GLUCAP 272* 296*    Radiology Studies: No results found.   Scheduled Meds: . amLODipine  5 mg Oral Daily  . brimonidine  1 drop Both Eyes TID  . cloNIDine  0.2 mg Oral Daily  . dorzolamide-timolol  1 drop Both Eyes BID  . insulin aspart  0-9 Units Subcutaneous TID WC  . insulin aspart protamine- aspart  15 Units Subcutaneous BID WC  . metoCLOPramide (REGLAN) injection  10 mg Intravenous Once  . metoCLOPramide  5 mg Oral Daily  . pantoprazole  40 mg Oral BID  . sucralfate  1 g Oral TID WC & HS   Continuous Infusions: . sodium chloride    . sodium chloride       LOS: 1 day    Time spent: 30 minutes    Barton Dubois, MD [facility]   To contact the attending provider between 7A-7P or the covering provider during after hours 7P-7A, please log into the web site [email or web address] and access using  universal  password for that web site. If you do not have the password, please call the hospital operator.  03/02/2020, 3:35 PM

## 2020-03-03 ENCOUNTER — Encounter (HOSPITAL_COMMUNITY): Payer: Self-pay | Admitting: Internal Medicine

## 2020-03-03 ENCOUNTER — Other Ambulatory Visit: Payer: Self-pay

## 2020-03-03 DIAGNOSIS — E1121 Type 2 diabetes mellitus with diabetic nephropathy: Secondary | ICD-10-CM

## 2020-03-03 LAB — CBC
HCT: 38.2 % — ABNORMAL LOW (ref 39.0–52.0)
Hemoglobin: 13.1 g/dL (ref 13.0–17.0)
MCH: 32.2 pg (ref 26.0–34.0)
MCHC: 34.3 g/dL (ref 30.0–36.0)
MCV: 93.9 fL (ref 80.0–100.0)
Platelets: 131 10*3/uL — ABNORMAL LOW (ref 150–400)
RBC: 4.07 MIL/uL — ABNORMAL LOW (ref 4.22–5.81)
RDW: 12.9 % (ref 11.5–15.5)
WBC: 10.3 10*3/uL (ref 4.0–10.5)
nRBC: 0 % (ref 0.0–0.2)

## 2020-03-03 LAB — BASIC METABOLIC PANEL
Anion gap: 19 — ABNORMAL HIGH (ref 5–15)
BUN: 47 mg/dL — ABNORMAL HIGH (ref 6–20)
CO2: 23 mmol/L (ref 22–32)
Calcium: 9.3 mg/dL (ref 8.9–10.3)
Chloride: 95 mmol/L — ABNORMAL LOW (ref 98–111)
Creatinine, Ser: 8.44 mg/dL — ABNORMAL HIGH (ref 0.61–1.24)
GFR calc Af Amer: 8 mL/min — ABNORMAL LOW (ref >60)
GFR calc non Af Amer: 7 mL/min — ABNORMAL LOW (ref >60)
Glucose, Bld: 233 mg/dL — ABNORMAL HIGH (ref 70–99)
Potassium: 3.1 mmol/L — ABNORMAL LOW (ref 3.5–5.1)
Sodium: 137 mmol/L (ref 135–145)

## 2020-03-03 LAB — MRSA PCR SCREENING: MRSA by PCR: NEGATIVE

## 2020-03-03 LAB — GLUCOSE, CAPILLARY: Glucose-Capillary: 229 mg/dL — ABNORMAL HIGH (ref 70–99)

## 2020-03-03 MED ORDER — DICYCLOMINE HCL 10 MG PO CAPS: 10.0000 mg | ORAL_CAPSULE | Freq: Four times a day (QID) | ORAL | 0 refills | Status: DC | PRN

## 2020-03-03 MED ORDER — PNEUMOCOCCAL VAC POLYVALENT 25 MCG/0.5ML IJ INJ: 0.5000 mL | INJECTION | INTRAMUSCULAR | Status: DC

## 2020-03-03 MED ORDER — METOCLOPRAMIDE HCL 5 MG PO TABS: 5.0000 mg | ORAL_TABLET | Freq: Two times a day (BID) | ORAL | 1 refills | Status: DC

## 2020-03-03 NOTE — Discharge Summary (Signed)
Physician Discharge Summary  Timothy Clark NKN:[id number] DOB: Apr 26, 1971 DOA: 03/01/2020  PCP: [name], MD  Admit date: 03/01/2020 Discharge date: 03/03/2020  Time spent: 35 minutes  Recommendations for Outpatient Follow-up:  1. Repeat renal function panel to follow electrolyte strend. 2. Continue to monitor patient symptoms and make sure he follow up with GI service.    Discharge Diagnoses:  Active Problems:   Essential hypertension   Chronic abdominal pain   Gastroparesis   ESRD on dialysis ([place])   Type 2 diabetes with nephropathy (HCC)   Coffee ground emesis   Hematemesis   Discharge Condition: stable ad improved. Discharge home with instructions to follow up with PCP and GI service.  Code status: Full code.  Diet recommendation: small multiple meals, heart healthy and modified carbohydrates.  Filed Weights   03/02/20 1110 03/02/20 1900 03/03/20 0500  Weight: (!) 124.8 kg (!) 122.9 kg (!) 122.3 kg    History of present illness:  As per H&P written by Dr. Waldron Labs on 03/01/2020 DouglasHamlinis a49 y.o.male,with a history of diabetes mellitus type 2, gastroparesis, hypertension,ESRDon hemodialysis Monday Wednesday Friday,peripheral vascular disease, morbid obesity, anxiety/depression, chronic abdominal pain, gastroparesis, patient was sent from [facility] for coffee-ground emesis. -Patient presents to ED and [facility] secondary to complaint of abdominal pain, nausea, this appears to be chronic problem, per the record this is a third visit to the ED (multiple facilities) in the last 7 days, patient has downtrending lipase,, patient had CT abdomen and pelvis which was reportedly unremarkable, patient was being prepared for discharge, then he had an episode of coffee-ground emesis, denies any NSAID use, aspirin, or any blood thinners or Goody's powder, he denies any previous episodes, his hemoglobin was 13.9 on admission to ED (this is a prior to his  vomiting, no repeat labs has been done since then, reportedly his vital signs were stable, and his INR was normal).Patient is following with local GI Dr. Gala Romney, so in order to facilitate continuity of care and HD, transfer requested to [facility].  Hospital Course:  1-coffee-ground emesis -One episode after multiple belching/retching symptoms (right prior to admission). -Prior endoscopic evaluation demonstrated esophagitis and gastritis. -Possible presentation maybe due to Mallory-Weiss or esophagitis.. -Hemodynamically stable overall with a stable hemoglobin level, no further episodes of hematemesis or vomiting. -Case discussed with gastroenterology service who felt no need for endoscopic evaluation at this time. -diet advanced, reglan dose adjusted and he will continue PPI and carafate. -PRN bentyl ordered and patient will follow up with GI service as an outpatient.   2-metabolic encephalopathy/depression/anxiety -With not specific source -symptoms resolved after receiving HD and overnight use of precedex. -oriented X 3, following commands appropriate and in no distress. Organized thought process and stable for discharge. -ammonia level, TSH and B12 checked and WNL. -continue PRN xanax and zoloft.  3-Essential hypertension -BP fairly controlled -calcium channel blocker has been discontinued due to gastroparesis component -resume B-blocker and encouraged to follow heart healthy diet. -HD will also continue contributing with BP control.   4-Chronic abdominal pain/Gastroparesis -continue reglan and PPI; as per GI service recommendations will use Reglan q12h, PPI BID and carafate. -avoid NSAID's and narcotics. -started on PRN bentyl. -outpatient follow up with GI service as instructed.  5-ESRD on dialysis [facility]) -Continue hemodialysis on M-W-F; last HD on 03/02/20, next treatment on 03/05/20. -Patient with mild hypokalemia at discharge, to be regulated with his HD. K 3.1 -repeat renal  function panel to follow electrolytes stability.  6-Controlled type 2 diabetes  mellitus with left eye affected by proliferative retinopathy with traction retinal detachment not involving macula, nephropathy and with long-term current use of insulin ([place]) -resume home hypoglycemic regimen and follow CBG's/A1C as an outpatient for further adjustment to his treatment as needed.  -Modified carbohydrate diet has been encouraged.  7-class II obesity -Low calorie diet and portion control discussed with patient. -Body mass index is 38.37 kg/m.  Procedures: See below for x-ray reports.   Consultations:  GI service.  Discharge Exam: Vitals:   03/03/20 0700 03/03/20 0741  BP: (!) 94/56   Pulse: 64   Resp: 18   Temp:  97.7 F (36.5 C)  SpO2: 98%     General: Alert, awake and oriented X 3; no nausea, no vomiting and currently no complaining of abd pain. Feels good and ready to go home. No further episodes of agitation or hematemesis.  Cardiovascular: S1 and S2, no rubs, no gallops, no JVD on exam. Respiratory: CTA bilaterally, good O2 sat on RA> Abd: soft, BT, ND, positive BS. Extremities: with chronic stasis dermatitis appreciated, no cyanosis or clubbing.   Discharge Instructions   Discharge Instructions    Diet - low sodium heart healthy   Complete by: As directed    Discharge instructions   Complete by: As directed    Take medications as prescribed  Maintain adequate hydration  Small multiple meals throughout the day. Resume HD on Monday as scheduled. Follow up with PCP in 10 days. Outpatient follow up with GI service (Dr. Gala Romney), office will contact you with appointment details.  Minimize/avoid the use of NSAID's medications.   Increase activity slowly   Complete by: As directed    No wound care   Complete by: As directed      Allergies as of 03/03/2020      Reactions   Donnatal [phenobarbital-belladonna Alk] Anaphylaxis, Other (See Comments)   Reaction to GI  cocktail   Fish Allergy Anaphylaxis, Hives, Rash   Haldol [haloperidol Lactate] Other (See Comments)   Chest Pain   Maalox [calcium Carbonate Antacid] Anaphylaxis, Other (See Comments)   Reaction to GI cocktail   Phenobarbital-belladonna Alk [pb-hyoscy-atropine-scopolamine] Anaphylaxis   Reaction to GI cocktail Other reaction(s): Other (See Comments), Other (See Comments) Reaction to GI cocktail Reaction to GI cocktail Reaction to GI cocktail   Shellfish Allergy Hives   Per patient: Patient stated he breaks out in hives when eating shellfish   Marinol [dronabinol] Nausea And Vomiting, Other (See Comments)   Caused worsening vomiting.    Aspirin Hives, Itching   Bactrim [sulfamethoxazole-trimethoprim] Itching   Keflex [cephalexin] Hives, Itching, Nausea And Vomiting   Penicillins Hives, Itching, Other (See Comments)   Has patient had a PCN reaction causing immediate rash, facial/tongue/throat swelling, SOB or lightheadedness with hypotension: yes Has patient had a PCN reaction causing severe rash involving mucus membranes or skin necrosis: yes Has patient had a PCN reaction that required hospitalization no Has patient had a PCN reaction occurring within the last 10 years: yes If all of the above answers are "NO", then may proceed with Cephalospor      Medication List    STOP taking these medications   amLODipine 5 MG tablet Commonly known as: NORVASC   insulin NPH Human 100 UNIT/ML injection Commonly known as: NOVOLIN N   oxyCODONE-acetaminophen 5-325 MG tablet Commonly known as: PERCOCET/ROXICET   promethazine 25 MG tablet Commonly known as: PHENERGAN   torsemide 20 MG tablet Commonly known as: DEMADEX     TAKE  these medications   ALPRAZolam 0.5 MG tablet Commonly known as: XANAX Take 0.5 mg by mouth 2 (two) times daily as needed.   atorvastatin 10 MG tablet Commonly known as: LIPITOR Take 10 mg by mouth at bedtime.   brimonidine 0.15 % ophthalmic  solution Commonly known as: ALPHAGAN Place 1 drop into both eyes in the morning and at bedtime.   brimonidine 0.2 % ophthalmic solution Commonly known as: ALPHAGAN Place 1 drop into both eyes 2 (two) times daily.   carvedilol 25 MG tablet Commonly known as: COREG Take 25 mg by mouth 2 (two) times daily with a meal.   cloNIDine 0.2 MG tablet Commonly known as: CATAPRES Take 0.2 mg by mouth daily.   cyanocobalamin 1000 MCG/ML injection Commonly known as: (VITAMIN B-12) Inject 1,000 mcg into the muscle every 30 (thirty) days.   dicyclomine 10 MG capsule Commonly known as: Bentyl Take 1 capsule (10 mg total) by mouth every 6 (six) hours as needed (abd pain and spasm.).   dorzolamide-timolol 22.3-6.8 MG/ML ophthalmic solution Commonly known as: COSOPT   folic acid 1 MG tablet Commonly known as: FOLVITE Take 1 mg by mouth daily.   metoCLOPramide 5 MG tablet Commonly known as: REGLAN Take 1 tablet (5 mg total) by mouth every 12 (twelve) hours. What changed: See the new instructions.   NovoLOG Mix 70/30 FlexPen (70-30) 100 UNIT/ML FlexPen Generic drug: insulin aspart protamine - aspart 35-55 Units by Subconjunctival route See admin instructions. Inject 55 units subcutaneously in the morning & inject 35 units subcutaneously in the evening.   ondansetron 4 MG disintegrating tablet Commonly known as: ZOFRAN-ODT Take 4 mg by mouth every 8 (eight) hours as needed for nausea or vomiting.   pantoprazole 40 MG tablet Commonly known as: PROTONIX Take 1 tablet (40 mg total) by mouth 2 (two) times daily before a meal.   sertraline 100 MG tablet Commonly known as: ZOLOFT Take 100 mg by mouth daily.   sitaGLIPtin 25 MG tablet Commonly known as: JANUVIA Take 25 mg by mouth daily.   sucralfate 1 g tablet Commonly known as: CARAFATE Take 1 g by mouth 4 (four) times daily.   timolol 0.5 % ophthalmic solution Commonly known as: TIMOPTIC Place 1 drop into both eyes 2 (two) times  daily.      Allergies  Allergen Reactions  . Donnatal [Phenobarbital-Belladonna Alk] Anaphylaxis and Other (See Comments)    Reaction to GI cocktail  . Fish Allergy Anaphylaxis, Hives and Rash  . Haldol [Haloperidol Lactate] Other (See Comments)    Chest Pain  . Maalox [Calcium Carbonate Antacid] Anaphylaxis and Other (See Comments)    Reaction to GI cocktail  . Phenobarbital-Belladonna Alk [Pb-Hyoscy-Atropine-Scopolamine] Anaphylaxis    Reaction to GI cocktail Other reaction(s): Other (See Comments), Other (See Comments) Reaction to GI cocktail Reaction to GI cocktail Reaction to GI cocktail  . Shellfish Allergy Hives    Per patient: Patient stated he breaks out in hives when eating shellfish  . Marinol [Dronabinol] Nausea And Vomiting and Other (See Comments)    Caused worsening vomiting.   . Aspirin Hives and Itching  . Bactrim [Sulfamethoxazole-Trimethoprim] Itching  . Keflex [Cephalexin] Hives, Itching and Nausea And Vomiting  . Penicillins Hives, Itching and Other (See Comments)    Has patient had a PCN reaction causing immediate rash, facial/tongue/throat swelling, SOB or lightheadedness with hypotension: yes Has patient had a PCN reaction causing severe rash involving mucus membranes or skin necrosis: yes Has patient had a PCN reaction  that required hospitalization no Has patient had a PCN reaction occurring within the last 10 years: yes If all of the above answers are "NO", then may proceed with Cephalospor    Follow-up Information    [name], MD. Schedule an appointment as soon as possible for a visit in 10 day(s).   Specialty: Internal Medicine Contact information: [place] [address] [phone]                The results of significant diagnostics from this hospitalization (including imaging, microbiology, ancillary and laboratory) are listed below for reference.    Significant Diagnostic Studies: No results  found.  Microbiology: Recent Results (from the past 240 hour(s))  MRSA PCR Screening     Status: None   Collection Time: 03/02/20  6:58 PM   Specimen: Nasal Mucosa; Nasopharyngeal  Result Value Ref Range Status   MRSA by PCR NEGATIVE NEGATIVE Final    Comment:        The GeneXpert MRSA Assay (FDA approved for NASAL specimens only), is one component of a comprehensive MRSA colonization surveillance program. It is not intended to diagnose MRSA infection nor to guide or monitor treatment for MRSA infections. Performed at [facility], [address] [place], [place] [address]      Labs: Basic Metabolic Panel: Recent Labs  Lab 03/01/20 2120 03/02/20 0612 03/03/20 0459  NA 135 137 137  K 3.1* 2.9* 3.1*  CL 94* 96* 95*  CO2 21* 23 23  GLUCOSE 277* 284* 233*  BUN 49* 57* 47*  CREATININE 8.06* 8.67* 8.44*  CALCIUM 9.3 8.9 9.3   Liver Function Tests: Recent Labs  Lab 03/01/20 2120  AST 25  ALT 20  ALKPHOS 55  BILITOT 1.1  PROT 8.8*  ALBUMIN 4.7   Recent Labs  Lab 03/01/20 2120  LIPASE 38   Recent Labs  Lab 03/02/20 1828  AMMONIA 26   CBC: Recent Labs  Lab 03/01/20 2120 03/02/20 0612 03/03/20 0459  WBC 11.6* 10.9* 10.3  NEUTROABS 8.9*  --   --   HGB 14.1 12.9* 13.1  HCT 40.7 36.9* 38.2*  MCV 94.4 92.5 93.9  PLT 150 141* 131*   CBG: Recent Labs  Lab 03/01/20 2033 03/02/20 0749 03/02/20 1714 03/02/20 2047 03/03/20 0743  GLUCAP 272* 296* 188* 219* 229*    Signed:  Barton Dubois MD.  [facility] 03/03/2020, 9:45 AM

## 2020-03-03 NOTE — Progress Notes (Signed)
Patient discharged to home. Sister came to take patient home IV access removed. Catheter for dialysis left in place.External urinary catheter taken off. Patient dressed and taken down by wheelchair.

## 2020-03-04 DIAGNOSIS — R112 Nausea with vomiting, unspecified: Secondary | ICD-10-CM | POA: Diagnosis not present

## 2020-03-04 DIAGNOSIS — R4182 Altered mental status, unspecified: Secondary | ICD-10-CM | POA: Diagnosis not present

## 2020-03-04 DIAGNOSIS — E1121 Type 2 diabetes mellitus with diabetic nephropathy: Secondary | ICD-10-CM | POA: Diagnosis not present

## 2020-03-04 DIAGNOSIS — R1010 Upper abdominal pain, unspecified: Secondary | ICD-10-CM | POA: Diagnosis not present

## 2020-03-04 DIAGNOSIS — K2970 Gastritis, unspecified, without bleeding: Secondary | ICD-10-CM | POA: Diagnosis not present

## 2020-03-04 DIAGNOSIS — K2930 Chronic superficial gastritis without bleeding: Secondary | ICD-10-CM | POA: Diagnosis not present

## 2020-03-04 DIAGNOSIS — Z992 Dependence on renal dialysis: Secondary | ICD-10-CM | POA: Diagnosis not present

## 2020-03-04 DIAGNOSIS — Z20822 Contact with and (suspected) exposure to covid-19: Secondary | ICD-10-CM | POA: Diagnosis not present

## 2020-03-04 DIAGNOSIS — K920 Hematemesis: Secondary | ICD-10-CM | POA: Diagnosis not present

## 2020-03-04 DIAGNOSIS — I120 Hypertensive chronic kidney disease with stage 5 chronic kidney disease or end stage renal disease: Secondary | ICD-10-CM | POA: Diagnosis not present

## 2020-03-04 DIAGNOSIS — E1159 Type 2 diabetes mellitus with other circulatory complications: Secondary | ICD-10-CM | POA: Diagnosis not present

## 2020-03-04 DIAGNOSIS — E1165 Type 2 diabetes mellitus with hyperglycemia: Secondary | ICD-10-CM | POA: Diagnosis not present

## 2020-03-04 DIAGNOSIS — K2960 Other gastritis without bleeding: Secondary | ICD-10-CM | POA: Diagnosis not present

## 2020-03-04 DIAGNOSIS — E876 Hypokalemia: Secondary | ICD-10-CM | POA: Diagnosis not present

## 2020-03-04 DIAGNOSIS — K3184 Gastroparesis: Secondary | ICD-10-CM | POA: Diagnosis not present

## 2020-03-04 DIAGNOSIS — G9341 Metabolic encephalopathy: Secondary | ICD-10-CM | POA: Diagnosis not present

## 2020-03-04 DIAGNOSIS — K209 Esophagitis, unspecified without bleeding: Secondary | ICD-10-CM | POA: Diagnosis not present

## 2020-03-04 DIAGNOSIS — E1143 Type 2 diabetes mellitus with diabetic autonomic (poly)neuropathy: Secondary | ICD-10-CM | POA: Diagnosis not present

## 2020-03-04 DIAGNOSIS — E860 Dehydration: Secondary | ICD-10-CM | POA: Diagnosis not present

## 2020-03-04 DIAGNOSIS — Z7982 Long term (current) use of aspirin: Secondary | ICD-10-CM | POA: Diagnosis not present

## 2020-03-04 DIAGNOSIS — K208 Other esophagitis without bleeding: Secondary | ICD-10-CM | POA: Diagnosis not present

## 2020-03-04 DIAGNOSIS — R1013 Epigastric pain: Secondary | ICD-10-CM | POA: Diagnosis not present

## 2020-03-04 DIAGNOSIS — E1122 Type 2 diabetes mellitus with diabetic chronic kidney disease: Secondary | ICD-10-CM | POA: Diagnosis not present

## 2020-03-04 DIAGNOSIS — N186 End stage renal disease: Secondary | ICD-10-CM | POA: Diagnosis not present

## 2020-03-07 DIAGNOSIS — Z992 Dependence on renal dialysis: Secondary | ICD-10-CM | POA: Diagnosis not present

## 2020-03-07 DIAGNOSIS — N186 End stage renal disease: Secondary | ICD-10-CM | POA: Diagnosis not present

## 2020-03-08 ENCOUNTER — Other Ambulatory Visit: Payer: Self-pay

## 2020-03-08 NOTE — Patient Outreach (Signed)
[facility]) Care Management  03/08/2020  Timothy Clark [birth year] [id number]     Transition of Care Referral  Referral Date: 03/08/2020 Referral Source: [facility] Discharge Report Date of Discharge: 03/06/20 Facility: [place]    Outreach attempt # 1 to patient. No answer. RN CM left HIPAA compliant voicemail message along with contact info.    Plan: RN CM will make outreach attempt to patient within 3-4 business days. RN CM will send unsuccessful outreach letter to patient.   Enzo Montgomery, RN,BSN,CCM [facility] Telephonic Care Management Coordinator Direct Phone: [phone] Toll Free: [phone] Fax: [phone]

## 2020-03-09 ENCOUNTER — Telehealth (INDEPENDENT_AMBULATORY_CARE_PROVIDER_SITE_OTHER): Payer: Self-pay

## 2020-03-09 ENCOUNTER — Other Ambulatory Visit: Payer: Self-pay

## 2020-03-09 DIAGNOSIS — Z992 Dependence on renal dialysis: Secondary | ICD-10-CM | POA: Diagnosis not present

## 2020-03-09 DIAGNOSIS — N186 End stage renal disease: Secondary | ICD-10-CM | POA: Diagnosis not present

## 2020-03-09 NOTE — Telephone Encounter (Signed)
Kristen from [facility] called wanting to reschedule the patient's surgery. Patient has been rescheduled to 03/21/20 with Dr. Delana Meyer for his left brachial axillary graft. Pre-op phone call on 03/15/20 between 8-1 pm and covid testing on 03/19/20 between 8-1 pm a the [facility]. Pre-surgical instructions will be faxed to attention Kristen at [facility] per her request.

## 2020-03-09 NOTE — Patient Outreach (Signed)
[facility]) Care Management  03/09/2020  NISHAWN ROTAN 1970-11-19 [id number]   Transition of Care Referral  Referral Date: 03/08/2020 Referral Source: [facility] Discharge Report Date of Discharge: 03/06/20 Facility: [place] [place]    Outreach attempt #2 to patient. Spoke briefly with patient as he was currently at HD treatment. He denies any acute issues or concerns at present. He confirms he has all his meds and no issues regarding them. He goes for MD follow up appt and voices he has reliable transportation to get him there. [facility] services reviewed and discussed. Patient appreciative of call but voices that he does not need any assistance at this time. He is aware that he cn call back if anything changes.    Plan: RN CM will close case at this time.    Enzo Montgomery, RN,BSN,CCM [facility] Telephonic Care Management Coordinator Direct Phone: [phone] Toll Free: [phone] Fax: [phone]

## 2020-03-10 DIAGNOSIS — Z992 Dependence on renal dialysis: Secondary | ICD-10-CM | POA: Diagnosis not present

## 2020-03-10 DIAGNOSIS — N186 End stage renal disease: Secondary | ICD-10-CM | POA: Diagnosis not present

## 2020-03-12 DIAGNOSIS — E782 Mixed hyperlipidemia: Secondary | ICD-10-CM | POA: Diagnosis not present

## 2020-03-12 DIAGNOSIS — Z992 Dependence on renal dialysis: Secondary | ICD-10-CM | POA: Diagnosis not present

## 2020-03-12 DIAGNOSIS — I129 Hypertensive chronic kidney disease with stage 1 through stage 4 chronic kidney disease, or unspecified chronic kidney disease: Secondary | ICD-10-CM | POA: Diagnosis not present

## 2020-03-12 DIAGNOSIS — N186 End stage renal disease: Secondary | ICD-10-CM | POA: Diagnosis not present

## 2020-03-12 DIAGNOSIS — N183 Chronic kidney disease, stage 3 unspecified: Secondary | ICD-10-CM | POA: Diagnosis not present

## 2020-03-12 DIAGNOSIS — E1122 Type 2 diabetes mellitus with diabetic chronic kidney disease: Secondary | ICD-10-CM | POA: Diagnosis not present

## 2020-03-12 NOTE — Telephone Encounter (Signed)
Patient called and has an appt on 03/19/20 which is the same day as his covid test. Patient will do his covid test on 03/20/20 before 11:00 am.

## 2020-03-13 DIAGNOSIS — Z01818 Encounter for other preprocedural examination: Secondary | ICD-10-CM | POA: Insufficient documentation

## 2020-03-13 DIAGNOSIS — Z8616 Personal history of COVID-19: Secondary | ICD-10-CM | POA: Insufficient documentation

## 2020-03-13 DIAGNOSIS — Z8719 Personal history of other diseases of the digestive system: Secondary | ICD-10-CM | POA: Insufficient documentation

## 2020-03-13 DIAGNOSIS — F419 Anxiety disorder, unspecified: Secondary | ICD-10-CM | POA: Insufficient documentation

## 2020-03-13 DIAGNOSIS — I150 Renovascular hypertension: Secondary | ICD-10-CM | POA: Insufficient documentation

## 2020-03-13 DIAGNOSIS — Z8619 Personal history of other infectious and parasitic diseases: Secondary | ICD-10-CM | POA: Insufficient documentation

## 2020-03-14 ENCOUNTER — Other Ambulatory Visit (INDEPENDENT_AMBULATORY_CARE_PROVIDER_SITE_OTHER): Payer: Self-pay | Admitting: Nurse Practitioner

## 2020-03-14 DIAGNOSIS — N186 End stage renal disease: Secondary | ICD-10-CM | POA: Diagnosis not present

## 2020-03-14 DIAGNOSIS — Z992 Dependence on renal dialysis: Secondary | ICD-10-CM | POA: Diagnosis not present

## 2020-03-15 ENCOUNTER — Other Ambulatory Visit: Payer: Self-pay

## 2020-03-15 ENCOUNTER — Other Ambulatory Visit
Admission: RE | Admit: 2020-03-15 | Discharge: 2020-03-15 | Disposition: A | Discharge status: Home / Self Care | Payer: Medicare HMO | Source: Ambulatory Visit | Attending: Vascular Surgery | Admitting: Vascular Surgery

## 2020-03-15 DIAGNOSIS — F419 Anxiety disorder, unspecified: Secondary | ICD-10-CM | POA: Diagnosis not present

## 2020-03-15 DIAGNOSIS — E7849 Other hyperlipidemia: Secondary | ICD-10-CM | POA: Diagnosis not present

## 2020-03-15 DIAGNOSIS — I10 Essential (primary) hypertension: Secondary | ICD-10-CM | POA: Diagnosis not present

## 2020-03-15 DIAGNOSIS — Z0001 Encounter for general adult medical examination with abnormal findings: Secondary | ICD-10-CM | POA: Diagnosis not present

## 2020-03-15 DIAGNOSIS — Z20822 Contact with and (suspected) exposure to covid-19: Secondary | ICD-10-CM | POA: Diagnosis not present

## 2020-03-15 DIAGNOSIS — Z01812 Encounter for preprocedural laboratory examination: Secondary | ICD-10-CM | POA: Diagnosis not present

## 2020-03-15 DIAGNOSIS — D519 Vitamin B12 deficiency anemia, unspecified: Secondary | ICD-10-CM | POA: Diagnosis not present

## 2020-03-15 DIAGNOSIS — D509 Iron deficiency anemia, unspecified: Secondary | ICD-10-CM | POA: Diagnosis not present

## 2020-03-15 DIAGNOSIS — E1122 Type 2 diabetes mellitus with diabetic chronic kidney disease: Secondary | ICD-10-CM | POA: Diagnosis not present

## 2020-03-15 DIAGNOSIS — I129 Hypertensive chronic kidney disease with stage 1 through stage 4 chronic kidney disease, or unspecified chronic kidney disease: Secondary | ICD-10-CM | POA: Diagnosis not present

## 2020-03-15 NOTE — Patient Instructions (Signed)
Your procedure is scheduled on: Wed. 8/11 Report to Day Surgery. To find out your arrival time please call [phone] between 1PM - 3PM on Tues 8/10.  Remember: Instructions that are not followed completely may result in serious medical risk,  up to and including death, or upon the discretion of your surgeon and anesthesiologist your  surgery may need to be rescheduled.     _X__ 1. Do not eat food after midnight the night before your procedure.                 No gum chewing or hard candies. You may drink clear liquids up to 2 hours                 before you are scheduled to arrive for your surgery- DO not drink clear                 liquids within 2 hours of the start of your surgery.                 Clear Liquids include:  water, G2 or                  Gatorade Zero (avoid Red/Purple/Blue), Black Coffee or Tea (Do not add                 anything to coffee or tea). _____2.   Complete the carbohydrate drink provided to you, 2 hours before arrival.  __X__2.  On the morning of surgery brush your teeth with toothpaste and water, you                may rinse your mouth with mouthwash if you wish.  Do not swallow any toothpaste of mouthwash.     ___ 3.  No Alcohol for 24 hours before or after surgery.   ___ 4.  Do Not Smoke or use e-cigarettes For 24 Hours Prior to Your Surgery.                 Do not use any chewable tobacco products for at least 6 hours prior to                 Surgery.  ___  5.  Do not use any recreational drugs (marijuana, cocaine, heroin, ecstacy, MDMA or other)                For at least one week prior to your surgery.  Combination of these drugs with anesthesia                May have life threatening results.  ____  6.  Bring all medications with you on the day of surgery if instructed.   ___x_  7.  Notify your doctor if there is any change in your medical condition      (cold, fever, infections).     Do not wear jewelry,  Do not  wear lotions,  Do not shave 48 hours prior to surgery. Men may shave face and neck. Do not bring valuables to the hospital.    [facility] is not responsible for any belongings or valuables.  Contacts, dentures or bridgework may not be worn into surgery. Leave your suitcase in the car. After surgery it may be brought to your room. For patients admitted to the hospital, discharge time is determined by your treatment team.   Patients discharged the day of surgery will not be allowed to drive home.  Make arrangements for someone to be with you for the first 24 hours of your Same Day Discharge.   Please read over the following fact sheets that you were given:    __x__ Take these medicines the morning of surgery with A SIP OF WATER:    1. ALPRAZolam (XANAX) 0.5 MG tablet  2. amLODipine (NORVASC) 5 MG tablet  3. brimonidine (ALPHAGAN) 0.15 % ophthalmic solution  4.carvedilol (COREG) 25 MG tablet  5.cloNIDine (CATAPRES) 0.2 MG tablet  6.dicyclomine (BENTYL) 10 MG capsule if needed             7.dorzolamide-timolol (COSOPT) 22.3-6.8 MG/ML ophthalmic solution             8.metoCLOPramide (REGLAN) 5 MG tablet             9.pantoprazole (PROTONIX) 40 MG tablet            10.sucralfate (CARAFATE) 1 g tablet  ____ Fleet Enema (as directed)   __x__ Use CHG Soap (or wipes) as directed  ____ Use Benzoyl Peroxide Gel as instructed  ____ Use inhalers on the day of surgery  ____ Stop metformin 2 days prior to surgery    ___x_ Take 1/2 of usual insulin dose the night before surgery. No insulin the morning          of surgery.   ____ Stop Coumadin/Plavix/aspirin on   _x___ Stop Anti-inflammatories today/  May take tylenol   ____ Stop supplements until after surgery.    ____ Bring C-Pap to the hospital.

## 2020-03-16 DIAGNOSIS — N186 End stage renal disease: Secondary | ICD-10-CM | POA: Diagnosis not present

## 2020-03-16 DIAGNOSIS — Z992 Dependence on renal dialysis: Secondary | ICD-10-CM | POA: Diagnosis not present

## 2020-03-19 ENCOUNTER — Other Ambulatory Visit: Payer: Medicare HMO

## 2020-03-19 DIAGNOSIS — N186 End stage renal disease: Secondary | ICD-10-CM | POA: Diagnosis not present

## 2020-03-19 DIAGNOSIS — Z992 Dependence on renal dialysis: Secondary | ICD-10-CM | POA: Diagnosis not present

## 2020-03-20 ENCOUNTER — Other Ambulatory Visit: Payer: Self-pay

## 2020-03-20 ENCOUNTER — Other Ambulatory Visit
Admission: RE | Admit: 2020-03-20 | Discharge: 2020-03-20 | Disposition: A | Discharge status: Home / Self Care | Payer: Medicare HMO | Source: Ambulatory Visit | Attending: Vascular Surgery | Admitting: Vascular Surgery

## 2020-03-20 DIAGNOSIS — Z20822 Contact with and (suspected) exposure to covid-19: Secondary | ICD-10-CM | POA: Diagnosis not present

## 2020-03-20 DIAGNOSIS — Z01812 Encounter for preprocedural laboratory examination: Secondary | ICD-10-CM | POA: Insufficient documentation

## 2020-03-20 LAB — BASIC METABOLIC PANEL
Anion gap: 15 (ref 5–15)
BUN: 35 mg/dL — ABNORMAL HIGH (ref 6–20)
CO2: 25 mmol/L (ref 22–32)
Calcium: 8.9 mg/dL (ref 8.9–10.3)
Chloride: 93 mmol/L — ABNORMAL LOW (ref 98–111)
Creatinine, Ser: 5.85 mg/dL — ABNORMAL HIGH (ref 0.61–1.24)
GFR calc Af Amer: 12 mL/min — ABNORMAL LOW (ref >60)
GFR calc non Af Amer: 10 mL/min — ABNORMAL LOW (ref >60)
Glucose, Bld: 237 mg/dL — ABNORMAL HIGH (ref 70–99)
Potassium: 3.2 mmol/L — ABNORMAL LOW (ref 3.5–5.1)
Sodium: 133 mmol/L — ABNORMAL LOW (ref 135–145)

## 2020-03-20 LAB — CBC WITH DIFFERENTIAL/PLATELET
Abs Immature Granulocytes: 0.02 10*3/uL (ref 0.00–0.07)
Basophils Absolute: 0 10*3/uL (ref 0.0–0.1)
Basophils Relative: 0 %
Eosinophils Absolute: 0.1 10*3/uL (ref 0.0–0.5)
Eosinophils Relative: 1 %
HCT: 33.3 % — ABNORMAL LOW (ref 39.0–52.0)
Hemoglobin: 11.1 g/dL — ABNORMAL LOW (ref 13.0–17.0)
Immature Granulocytes: 0 %
Lymphocytes Relative: 19 %
Lymphs Abs: 1.2 10*3/uL (ref 0.7–4.0)
MCH: 32.5 pg (ref 26.0–34.0)
MCHC: 33.3 g/dL (ref 30.0–36.0)
MCV: 97.4 fL (ref 80.0–100.0)
Monocytes Absolute: 0.5 10*3/uL (ref 0.1–1.0)
Monocytes Relative: 8 %
Neutro Abs: 4.4 10*3/uL (ref 1.7–7.7)
Neutrophils Relative %: 72 %
Platelets: 144 10*3/uL — ABNORMAL LOW (ref 150–400)
RBC: 3.42 MIL/uL — ABNORMAL LOW (ref 4.22–5.81)
RDW: 13.6 % (ref 11.5–15.5)
WBC: 6.2 10*3/uL (ref 4.0–10.5)
nRBC: 0 % (ref 0.0–0.2)

## 2020-03-20 LAB — PROTIME-INR
INR: 1 (ref 0.8–1.2)
Prothrombin Time: 13.2 seconds (ref 11.4–15.2)

## 2020-03-20 LAB — SARS CORONAVIRUS 2 (TAT 6-24 HRS): SARS Coronavirus 2: NEGATIVE

## 2020-03-20 LAB — APTT: aPTT: 37 seconds — ABNORMAL HIGH (ref 24–36)

## 2020-03-21 ENCOUNTER — Encounter: Payer: Self-pay | Admitting: Vascular Surgery

## 2020-03-21 ENCOUNTER — Ambulatory Visit
Admission: RE | Admit: 2020-03-21 | Discharge: 2020-03-21 | Disposition: A | Discharge status: Home / Self Care | Payer: Medicare HMO | Attending: Vascular Surgery | Admitting: Vascular Surgery

## 2020-03-21 ENCOUNTER — Other Ambulatory Visit: Payer: Self-pay

## 2020-03-21 ENCOUNTER — Encounter
Admission: RE | Disposition: A | Discharge status: Home / Self Care | Payer: Self-pay | Source: Home / Self Care | Attending: Vascular Surgery

## 2020-03-21 ENCOUNTER — Encounter: Admission: RE | Payer: Self-pay | Source: Home / Self Care

## 2020-03-21 ENCOUNTER — Ambulatory Visit: Payer: Medicare HMO | Admitting: Certified Registered"

## 2020-03-21 DIAGNOSIS — E1143 Type 2 diabetes mellitus with diabetic autonomic (poly)neuropathy: Secondary | ICD-10-CM | POA: Diagnosis not present

## 2020-03-21 DIAGNOSIS — Z8616 Personal history of COVID-19: Secondary | ICD-10-CM | POA: Insufficient documentation

## 2020-03-21 DIAGNOSIS — Z880 Allergy status to penicillin: Secondary | ICD-10-CM | POA: Insufficient documentation

## 2020-03-21 DIAGNOSIS — D649 Anemia, unspecified: Secondary | ICD-10-CM | POA: Diagnosis not present

## 2020-03-21 DIAGNOSIS — I5032 Chronic diastolic (congestive) heart failure: Secondary | ICD-10-CM | POA: Diagnosis not present

## 2020-03-21 DIAGNOSIS — I132 Hypertensive heart and chronic kidney disease with heart failure and with stage 5 chronic kidney disease, or end stage renal disease: Secondary | ICD-10-CM | POA: Diagnosis not present

## 2020-03-21 DIAGNOSIS — I509 Heart failure, unspecified: Secondary | ICD-10-CM | POA: Diagnosis not present

## 2020-03-21 DIAGNOSIS — E1122 Type 2 diabetes mellitus with diabetic chronic kidney disease: Secondary | ICD-10-CM | POA: Diagnosis not present

## 2020-03-21 DIAGNOSIS — N186 End stage renal disease: Secondary | ICD-10-CM | POA: Insufficient documentation

## 2020-03-21 DIAGNOSIS — Z992 Dependence on renal dialysis: Secondary | ICD-10-CM | POA: Insufficient documentation

## 2020-03-21 DIAGNOSIS — Z888 Allergy status to other drugs, medicaments and biological substances status: Secondary | ICD-10-CM | POA: Diagnosis not present

## 2020-03-21 DIAGNOSIS — K219 Gastro-esophageal reflux disease without esophagitis: Secondary | ICD-10-CM | POA: Diagnosis not present

## 2020-03-21 DIAGNOSIS — F418 Other specified anxiety disorders: Secondary | ICD-10-CM | POA: Diagnosis not present

## 2020-03-21 DIAGNOSIS — Z886 Allergy status to analgesic agent status: Secondary | ICD-10-CM | POA: Diagnosis not present

## 2020-03-21 DIAGNOSIS — N185 Chronic kidney disease, stage 5: Secondary | ICD-10-CM | POA: Diagnosis not present

## 2020-03-21 DIAGNOSIS — F419 Anxiety disorder, unspecified: Secondary | ICD-10-CM | POA: Insufficient documentation

## 2020-03-21 DIAGNOSIS — F329 Major depressive disorder, single episode, unspecified: Secondary | ICD-10-CM | POA: Insufficient documentation

## 2020-03-21 DIAGNOSIS — Z881 Allergy status to other antibiotic agents status: Secondary | ICD-10-CM | POA: Diagnosis not present

## 2020-03-21 HISTORY — PX: AV FISTULA PLACEMENT: SHX1204

## 2020-03-21 LAB — TYPE AND SCREEN
ABO/RH(D): O POS
Antibody Screen: NEGATIVE

## 2020-03-21 LAB — GLUCOSE, CAPILLARY: Glucose-Capillary: 208 mg/dL — ABNORMAL HIGH (ref 70–99)

## 2020-03-21 LAB — POTASSIUM: Potassium: 4.3 mmol/L (ref 3.5–5.1)

## 2020-03-21 SURGERY — INSERTION OF ARTERIOVENOUS (AV) GORE-TEX GRAFT ARM
Anesthesia: General | Laterality: Left

## 2020-03-21 MED ORDER — ORAL CARE MOUTH RINSE: 15.0000 mL | Freq: Once | OROMUCOSAL | Status: AC

## 2020-03-21 MED ORDER — PROPOFOL 10 MG/ML IV BOLUS
INTRAVENOUS | Status: DC | PRN
  Administered 2020-03-21: 100 mg via INTRAVENOUS

## 2020-03-21 MED ORDER — BUPIVACAINE LIPOSOME 1.3 % IJ SUSP
INTRAMUSCULAR | Status: AC
  Filled 2020-03-21: qty 20

## 2020-03-21 MED ORDER — ROCURONIUM BROMIDE 10 MG/ML (PF) SYRINGE
PREFILLED_SYRINGE | INTRAVENOUS | Status: AC
  Filled 2020-03-21: qty 10

## 2020-03-21 MED ORDER — ONDANSETRON HCL 4 MG/2ML IJ SOLN
INTRAMUSCULAR | Status: DC | PRN
  Administered 2020-03-21: 4 mg via INTRAVENOUS

## 2020-03-21 MED ORDER — SODIUM CHLORIDE 0.9 % IV SOLN: INTRAVENOUS | Status: DC

## 2020-03-21 MED ORDER — MIDAZOLAM HCL 2 MG/2ML IJ SOLN
INTRAMUSCULAR | Status: AC
  Filled 2020-03-21: qty 2

## 2020-03-21 MED ORDER — HEPARIN SODIUM (PORCINE) 5000 UNIT/ML IJ SOLN
INTRAMUSCULAR | Status: AC
  Filled 2020-03-21: qty 1

## 2020-03-21 MED ORDER — ROCURONIUM BROMIDE 100 MG/10ML IV SOLN
INTRAVENOUS | Status: DC | PRN
  Administered 2020-03-21: 50 mg via INTRAVENOUS

## 2020-03-21 MED ORDER — SODIUM CHLORIDE 0.9 % IV SOLN
INTRAVENOUS | Status: DC | PRN
  Administered 2020-03-21: 100 mL via INTRAMUSCULAR

## 2020-03-21 MED ORDER — DEXAMETHASONE SODIUM PHOSPHATE 10 MG/ML IJ SOLN
INTRAMUSCULAR | Status: AC
  Filled 2020-03-21: qty 1

## 2020-03-21 MED ORDER — MIDAZOLAM HCL 2 MG/2ML IJ SOLN
INTRAMUSCULAR | Status: DC | PRN
  Administered 2020-03-21: 2 mg via INTRAVENOUS

## 2020-03-21 MED ORDER — BUPIVACAINE HCL (PF) 0.5 % IJ SOLN
INTRAMUSCULAR | Status: AC
  Filled 2020-03-21: qty 30

## 2020-03-21 MED ORDER — CHLORHEXIDINE GLUCONATE CLOTH 2 % EX PADS
6.0000 | MEDICATED_PAD | Freq: Once | CUTANEOUS | Status: AC
  Administered 2020-03-21: 6 via TOPICAL

## 2020-03-21 MED ORDER — ONDANSETRON HCL 4 MG/2ML IJ SOLN
INTRAMUSCULAR | Status: AC
  Filled 2020-03-21: qty 2

## 2020-03-21 MED ORDER — ACETAMINOPHEN 10 MG/ML IV SOLN
INTRAVENOUS | Status: DC | PRN
  Administered 2020-03-21: 1000 mg via INTRAVENOUS

## 2020-03-21 MED ORDER — DEXAMETHASONE SODIUM PHOSPHATE 10 MG/ML IJ SOLN
INTRAMUSCULAR | Status: DC | PRN
  Administered 2020-03-21: 10 mg via INTRAVENOUS

## 2020-03-21 MED ORDER — OXYCODONE HCL 5 MG PO TABS: 5.0000 mg | ORAL_TABLET | Freq: Once | ORAL | Status: DC | PRN

## 2020-03-21 MED ORDER — LIDOCAINE HCL (PF) 2 % IJ SOLN
INTRAMUSCULAR | Status: AC
  Filled 2020-03-21: qty 5

## 2020-03-21 MED ORDER — PROPOFOL 10 MG/ML IV BOLUS
INTRAVENOUS | Status: AC
  Filled 2020-03-21: qty 20

## 2020-03-21 MED ORDER — OXYCODONE HCL 5 MG/5ML PO SOLN: 5.0000 mg | Freq: Once | ORAL | Status: DC | PRN

## 2020-03-21 MED ORDER — FENTANYL CITRATE (PF) 100 MCG/2ML IJ SOLN
INTRAMUSCULAR | Status: DC | PRN
  Administered 2020-03-21: 100 ug via INTRAVENOUS
  Administered 2020-03-21: 50 ug via INTRAVENOUS
  Administered 2020-03-21: 100 ug via INTRAVENOUS

## 2020-03-21 MED ORDER — SUGAMMADEX SODIUM 500 MG/5ML IV SOLN
INTRAVENOUS | Status: DC | PRN
  Administered 2020-03-21: 200 mg via INTRAVENOUS

## 2020-03-21 MED ORDER — FENTANYL CITRATE (PF) 250 MCG/5ML IJ SOLN
INTRAMUSCULAR | Status: AC
  Filled 2020-03-21: qty 5

## 2020-03-21 MED ORDER — CHLORHEXIDINE GLUCONATE 0.12 % MT SOLN: 15.0000 mL | Freq: Once | OROMUCOSAL | Status: AC

## 2020-03-21 MED ORDER — FENTANYL CITRATE (PF) 100 MCG/2ML IJ SOLN
INTRAMUSCULAR | Status: AC
  Filled 2020-03-21: qty 2

## 2020-03-21 MED ORDER — CLINDAMYCIN PHOSPHATE 300 MG/50ML IV SOLN
300.0000 mg | INTRAVENOUS | Status: AC
  Administered 2020-03-21: 300 mg via INTRAVENOUS

## 2020-03-21 MED ORDER — LIDOCAINE HCL (CARDIAC) PF 100 MG/5ML IV SOSY
PREFILLED_SYRINGE | INTRAVENOUS | Status: DC | PRN
  Administered 2020-03-21: 80 mg via INTRAVENOUS

## 2020-03-21 MED ORDER — CLINDAMYCIN PHOSPHATE 300 MG/50ML IV SOLN
INTRAVENOUS | Status: AC
  Filled 2020-03-21: qty 50

## 2020-03-21 MED ORDER — OXYCODONE-ACETAMINOPHEN 5-325 MG PO TABS: 1.0000 | ORAL_TABLET | Freq: Four times a day (QID) | ORAL | 0 refills | Status: DC | PRN

## 2020-03-21 MED ORDER — FENTANYL CITRATE (PF) 100 MCG/2ML IJ SOLN
25.0000 ug | INTRAMUSCULAR | Status: DC | PRN
  Administered 2020-03-21: 25 ug via INTRAVENOUS

## 2020-03-21 MED ORDER — CHLORHEXIDINE GLUCONATE 0.12 % MT SOLN
OROMUCOSAL | Status: AC
  Administered 2020-03-21: 15 mL via OROMUCOSAL
  Filled 2020-03-21: qty 15

## 2020-03-21 MED ORDER — HEMOSTATIC AGENTS (NO CHARGE) OPTIME
TOPICAL | Status: DC | PRN
  Administered 2020-03-21: 1 via TOPICAL

## 2020-03-21 MED ORDER — ACETAMINOPHEN 10 MG/ML IV SOLN
INTRAVENOUS | Status: AC
  Filled 2020-03-21: qty 100

## 2020-03-21 SURGICAL SUPPLY — 63 items
ADH SKN CLS APL DERMABOND .7 (GAUZE/BANDAGES/DRESSINGS) ×2
APL PRP STRL LF DISP 70% ISPRP (MISCELLANEOUS) ×1
APPLIER CLIP 11 MED OPEN (CLIP)
APPLIER CLIP 9.375 SM OPEN (CLIP)
APR CLP MED 11 20 MLT OPN (CLIP)
APR CLP SM 9.3 20 MLT OPN (CLIP)
BAG COUNTER SPONGE EZ (MISCELLANEOUS) ×2 IMPLANT
BAG DECANTER FOR FLEXI CONT (MISCELLANEOUS) ×2 IMPLANT
BAG SPNG 4X4 CLR HAZ (MISCELLANEOUS) ×1
BLADE SURG SZ11 CARB STEEL (BLADE) ×2 IMPLANT
BOOT SUTURE AID YELLOW STND (SUTURE) ×2 IMPLANT
BRUSH SCRUB EZ  4% CHG (MISCELLANEOUS) ×1
BRUSH SCRUB EZ 4% CHG (MISCELLANEOUS) ×1 IMPLANT
CANISTER SUCT 1200ML W/VALVE (MISCELLANEOUS) ×2 IMPLANT
CHLORAPREP W/TINT 26 (MISCELLANEOUS) ×2 IMPLANT
CLIP APPLIE 11 MED OPEN (CLIP) IMPLANT
CLIP APPLIE 9.375 SM OPEN (CLIP) IMPLANT
COVER WAND RF STERILE (DRAPES) ×2 IMPLANT
DERMABOND ADVANCED (GAUZE/BANDAGES/DRESSINGS) ×2
DERMABOND ADVANCED .7 DNX12 (GAUZE/BANDAGES/DRESSINGS) ×2 IMPLANT
DRESSING SURGICEL FIBRLLR 1X2 (HEMOSTASIS) ×1 IMPLANT
DRSG SURGICEL FIBRILLAR 1X2 (HEMOSTASIS) ×2
ELECT CAUTERY BLADE 6.4 (BLADE) ×2 IMPLANT
ELECT REM PT RETURN 9FT ADLT (ELECTROSURGICAL) ×2
ELECTRODE REM PT RTRN 9FT ADLT (ELECTROSURGICAL) ×1 IMPLANT
GAUZE 4X4 16PLY RFD (DISPOSABLE) ×2 IMPLANT
GLOVE BIO SURGEON STRL SZ7 (GLOVE) ×8 IMPLANT
GLOVE INDICATOR 7.5 STRL GRN (GLOVE) ×8 IMPLANT
GLOVE SURG SYN 8.0 (GLOVE) ×2 IMPLANT
GOWN STRL REUS W/ TWL LRG LVL3 (GOWN DISPOSABLE) ×4 IMPLANT
GOWN STRL REUS W/ TWL XL LVL3 (GOWN DISPOSABLE) ×1 IMPLANT
GOWN STRL REUS W/TWL LRG LVL3 (GOWN DISPOSABLE) ×8
GOWN STRL REUS W/TWL XL LVL3 (GOWN DISPOSABLE) ×2
GRAFT PROPATEN STD WALL 4 7X45 (Vascular Products) ×2 IMPLANT
IV NS 500ML (IV SOLUTION) ×2
IV NS 500ML BAXH (IV SOLUTION) ×1 IMPLANT
KIT TURNOVER KIT A (KITS) ×2 IMPLANT
LABEL OR SOLS (LABEL) ×2 IMPLANT
LOOP RED MAXI  1X406MM (MISCELLANEOUS) ×1
LOOP VESSEL MAXI 1X406 RED (MISCELLANEOUS) ×1 IMPLANT
LOOP VESSEL MINI 0.8X406 BLUE (MISCELLANEOUS) ×2 IMPLANT
LOOPS BLUE MINI 0.8X406MM (MISCELLANEOUS) ×2
NEEDLE FILTER BLUNT 18X 1/2SAF (NEEDLE) ×1
NEEDLE FILTER BLUNT 18X1 1/2 (NEEDLE) ×1 IMPLANT
NS IRRIG 500ML POUR BTL (IV SOLUTION) ×2 IMPLANT
PACK EXTREMITY (MISCELLANEOUS) ×2 IMPLANT
PAD PREP 24X41 OB/GYN DISP (PERSONAL CARE ITEMS) ×2 IMPLANT
STOCKINETTE STRL 4IN 9604848 (GAUZE/BANDAGES/DRESSINGS) ×2 IMPLANT
SUT GTX CV-6 30 (SUTURE) ×4 IMPLANT
SUT MNCRL+ 5-0 UNDYED PC-3 (SUTURE) ×1 IMPLANT
SUT MONOCRYL 5-0 (SUTURE) ×1
SUT PROLENE 6 0 BV (SUTURE) ×6 IMPLANT
SUT SILK 2 0 (SUTURE) ×2
SUT SILK 2 0 SH (SUTURE) ×2 IMPLANT
SUT SILK 2-0 18XBRD TIE 12 (SUTURE) ×1 IMPLANT
SUT SILK 3 0 (SUTURE) ×2
SUT SILK 3-0 18XBRD TIE 12 (SUTURE) ×1 IMPLANT
SUT SILK 4 0 (SUTURE) ×2
SUT SILK 4-0 18XBRD TIE 12 (SUTURE) ×1 IMPLANT
SUT VIC AB 3-0 SH 27 (SUTURE) ×2
SUT VIC AB 3-0 SH 27X BRD (SUTURE) ×1 IMPLANT
SYR 20ML LL LF (SYRINGE) ×2 IMPLANT
SYR 3ML LL SCALE MARK (SYRINGE) ×2 IMPLANT

## 2020-03-21 NOTE — Discharge Instructions (Signed)

## 2020-03-21 NOTE — Op Note (Signed)
OPERATIVE NOTE   PROCEDURE: left brachial axillary arteriovenous graft placement  PRE-OPERATIVE DIAGNOSIS: End Stage Renal Disease  POST-OPERATIVE DIAGNOSIS: End Stage Renal Disease  SURGEON: [name]  ASSISTANT(S): Ms. Hezzie Bump  ANESTHESIA: general  ESTIMATED BLOOD LOSS: <50 cc  FINDING(S): 4 mm brachial artery 8 mm axillary vein  SPECIMEN(S):  none  INDICATIONS:   Timothy Clark is a 49 y.o. male who presents with end stage renal disease.  The patient is scheduled for left brachial axillary AV graft placement.  The patient is aware the risks include but are not limited to: bleeding, infection, steal syndrome, nerve damage, ischemic monomelic neuropathy, failure to mature, and need for additional procedures.  The patient is aware of the risks of the procedure and elects to proceed forward.  DESCRIPTION: After full informed written consent was obtained from the patient, the patient was brought back to the operating room and placed supine upon the operating table.  Prior to induction, the patient received IV antibiotics.   After obtaining adequate anesthesia, the patient was then prepped and draped in the standard fashion for a left arm access procedure.   A first assistant was required to provide a safe and appropriate environment for executing the surgery.  The assistant was integral in providing retraction, exposure, running suture providing suction and in the closing process.   A linear incision was then created along the medial border of the biceps muscle just proximal to the antecubital crease and the brachial artery which was exposed through. The brachial artery was then looped proximally and distally with Silastic Vesseloops. Side branches were controlled with 4-0 silk ties.  Attention was then turned to the exposure of the axillary vein. Linear incision was then created medial to the proximal portion of the biceps at the level of the anterior axillary  crease. The axillary vein was exposed and again looped proximally and distally with Silastic vessel loops. Associated tributaries were also controlled with Silastic Vesseloops.  The Gore tunneler was then delivered onto the field and a subcutaneous path was made from the arterial incision to the venous incision. A 4-7 tapered PTFE propatent graft by Simeon Craft was then pulled through the subcutaneous tunnel. The arterial 4 mm portion was then approximated to the brachial artery. Brachial artery was controlled proximally and distally with the Silastic Vesseloops. Arteriotomy was made with an 11 blade scalpel and extended with Potts scissors and a 6-0 Prolene stay suture was placed. End graft to side brachial artery anastomosis was then fashioned with running CV 6 suture. Flushing maneuvers were performed suture line was hemostatic and the graft was then assessed for proper position and ease of future cannulation. Heparinized saline was infused into the vein and the graft was clamped with a vascular clamp. With the graft pressurized it was approximated to the axillary vein in its native bed and then marked with a surgical marker. The vein was then delivered into the surgical field and controlled with the Silastic vessel loops. Venotomy was then made with an 11 blade scalpel and extended with Potts scissors and a 6-0 Prolene suture was used as stay suture. The the graft was then sewn to the vein in an end graft to side vein fashion using running CV 6 suture.  Flushing maneuvers were performed and the artery was allowed to forward and back bleed.  Flow was then established through the AV graft  There was good  thrill in the venous outflow, and there was 1+ palpable radial  pulse.  At this point, I irrigated out the surgical wounds.  There was no further active bleeding.  The subcutaneous tissue was reapproximated with a running stitch of 3-0 Vicryl.  The skin was then reapproximated with a running subcuticular stitch of  4-0 Vicryl.  The skin was then cleaned, dried, and reinforced with Dermabond.    The patient tolerated this procedure well.   COMPLICATIONS: None  CONDITION: Timothy Clark [facility]  Office: [phone]   03/21/2020, 2:44 PM

## 2020-03-21 NOTE — Anesthesia Preprocedure Evaluation (Signed)
Anesthesia Evaluation  Patient identified by MRN, date of birth, ID band Patient awake    Reviewed: Allergy & Precautions, H&P , NPO status , Patient's Chart, lab work & pertinent test results  History of Anesthesia Complications Negative for: history of anesthetic complications  Airway Mallampati: III  TM Distance: <3 FB Neck ROM: limited    Dental  (+) Chipped, Poor Dentition, Missing   Pulmonary neg shortness of breath,    Pulmonary exam normal        Cardiovascular Exercise Tolerance: Good hypertension, (-) angina+ Peripheral Vascular Disease and +CHF  (-) Past MI and (-) DOE Normal cardiovascular exam     Neuro/Psych PSYCHIATRIC DISORDERS negative neurological ROS  negative psych ROS   GI/Hepatic Neg liver ROS, GERD  Medicated and Controlled,  Endo/Other  diabetes, Type 2, Insulin Dependent  Renal/GU DialysisRenal disease     Musculoskeletal   Abdominal   Peds  Hematology negative hematology ROS (+) Blood dyscrasia, anemia ,   Anesthesia Other Findings Past Medical History: No date: Anemia No date: Anxiety No date: CHF (congestive heart failure) (HCC)     Comment:  diastolic dysfunction No date: Chronic abdominal pain 07/2019: COVID-19 No date: Depression No date: Esophagitis No date: ESRD on hemodialysis ([place]) No date: Eye hemorrhage No date: Gastritis No date: Gastroparesis No date: GERD (gastroesophageal reflux disease) No date: Glaucoma 8563: Helicobacter pylori (H. pylori) infection     Comment:  ERADICATION DOCUMENTED VIA STOOL TEST in AUG 2016 at               [facility] No date: Hiccups No date: Hypertension No date: Nausea and vomiting     Comment:  chronic, recurrent No date: Neuropathy     Comment:  feet No date: Renal insufficiency No date: Type 2 diabetes mellitus with complications (HCC)     Comment:  diagnosed around age 37  Past Surgical History: No date: AMPUTATION TOE;  Right     Comment:  great toe 04/06/2019: AV FISTULA INSERTION W/ RF MAGNETIC GUIDANCE; Left     Comment:  Procedure: AV FISTULA INSERTION W/RF MAGNETIC GUIDANCE;               Surgeon: Katha Cabal, MD;  Location: [place]              [facility];  Service: Cardiovascular;  Laterality: Left; 05/06/2019: AV FISTULA PLACEMENT; Left     Comment:  Procedure: INSERTION OF ARTERIOVENOUS (AV) GORE-TEX               GRAFT ARM ( FOREARM LOOP );  Surgeon: Katha Cabal,              MD;  Location: [facility];  Service: Vascular;  Laterality:              Left; 05/19/2016: CATARACT EXTRACTION W/PHACO; Left     Comment:  Procedure: CATARACT EXTRACTION PHACO AND INTRAOCULAR               LENS PLACEMENT LEFT EYE;  Surgeon: Tonny Branch, MD;                Location: [facility];  Service: Ophthalmology;  Laterality:               Left;  CDE: 7.30 06/23/2016: CATARACT EXTRACTION W/PHACO; Right     Comment:  Procedure: CATARACT EXTRACTION PHACO AND INTRAOCULAR               LENS PLACEMENT RIGHT  EYE CDE=9.87;  Surgeon: Tonny Branch,               MD;  Location: [facility];  Service: Ophthalmology;                Laterality: Right;  right 07/19/2019: DIALYSIS/PERMA CATHETER REMOVAL; N/A     Comment:  Procedure: DIALYSIS/PERMA CATHETER REMOVAL;  Surgeon:               Katha Cabal, MD;  Location: [place] [facility];               Service: Cardiovascular;  Laterality: N/A; 2015: ESOPHAGOGASTRODUODENOSCOPY     Comment:  Dr. Britta Mccreedy 10/26/2014: ESOPHAGOGASTRODUODENOSCOPY; N/A     Comment:  RMR: Distal esophagititis-likely reflux related although              an element of pill induced injuruy no exclueded  status               post biopsy. Diffusely abnormal gastric mucosa of               uncertain signigicane -status post gastric biopsy. Focal               area of excoriation in the cardia most consistant with a               trauma of heaving.  08/2015: ESOPHAGOGASTRODUODENOSCOPY     Comment:   [facility]: mild esophagitis and old gastric contents 2015: EYE SURGERY; Left     Comment:  stent placed to left eye 06/2019: EYE SURGERY; Right     Comment:  per patient- to remove scar tissue 07/16/2017: INCISION AND DRAINAGE OF WOUND; Right     Comment:  Procedure: IRRIGATION AND DEBRIDEMENT OF SOFT TISSUE OF               ULCERATION RIGHT FOOT;  Surgeon: Caprice Beaver, DPM;              Location: [facility];  Service: Podiatry;  Laterality: Right; 09/27/2019: PERIPHERAL VASCULAR THROMBECTOMY; Left     Comment:  Procedure: PERIPHERAL VASCULAR THROMBECTOMY;  Surgeon:               Katha Cabal, MD;  Location: [place] [facility];               Service: Cardiovascular;  Laterality: Left; 10/27/2019: PERIPHERAL VASCULAR THROMBECTOMY; Left     Comment:  Procedure: PERIPHERAL VASCULAR THROMBECTOMY;  Surgeon:               Algernon Huxley, MD;  Location: [place] [facility];                Service: Cardiovascular;  Laterality: Left; 12/13/2019: PERIPHERAL VASCULAR THROMBECTOMY; Left     Comment:  Procedure: PERIPHERAL VASCULAR THROMBECTOMY;  Surgeon:               Katha Cabal, MD;  Location: [place] [facility];               Service: Cardiovascular;  Laterality: Left;     Reproductive/Obstetrics negative OB ROS                             Anesthesia Physical Anesthesia Plan  ASA: IV  Anesthesia Plan: General ETT   Post-op Pain Management:    Induction: Intravenous  PONV Risk Score and Plan: Ondansetron, Dexamethasone, Midazolam and  Treatment may vary due to age or medical condition  Airway Management Planned: Oral ETT  Additional Equipment:   Intra-op Plan:   Post-operative Plan: Extubation in OR  Informed Consent: I have reviewed the patients History and Physical, chart, labs and discussed the procedure including the risks, benefits and alternatives for the proposed anesthesia with the patient or authorized representative who has indicated  his/her understanding and acceptance.     Dental Advisory Given  Plan Discussed with: Anesthesiologist, CRNA and Surgeon  Anesthesia Plan Comments: (Patient consented for risks of anesthesia including but not limited to:  - adverse reactions to medications - damage to eyes, teeth, lips or other oral mucosa - nerve damage due to positioning  - sore throat or hoarseness - Damage to heart, brain, nerves, lungs, other parts of body or loss of life  Patient voiced understanding.)        Anesthesia Quick Evaluation

## 2020-03-21 NOTE — Transfer of Care (Signed)
Immediate Anesthesia Transfer of Care Note  Patient: Timothy Clark  Procedure(s) Performed: INSERTION OF ARTERIOVENOUS (AV) GORE-TEX GRAFT ARM ( BRACHIAL AXILLARY ) (Left )  Patient Location: PACU  Anesthesia Type:General  Level of Consciousness: drowsy  Airway & Oxygen Therapy: Patient Spontanous Breathing and Patient connected to face mask oxygen  Post-op Assessment: Report given to RN and Post -op Vital signs reviewed and stable  Post vital signs: Reviewed and stable  Last Vitals:  Vitals Value Taken Time  BP 166/88 03/21/20 1454  Temp    Pulse 73 03/21/20 1456  Resp 0 03/21/20 1456  SpO2 100 % 03/21/20 1456  Vitals shown include unvalidated device data.  Last Pain:  Vitals:   03/21/20 1022  TempSrc: Oral  PainSc: 0-No pain         Complications: No complications documented.

## 2020-03-21 NOTE — Anesthesia Procedure Notes (Signed)
Procedure Name: Intubation Performed by: Gaynelle Cage, CRNA Pre-anesthesia Checklist: Patient identified, Emergency Drugs available, Suction available and Patient being monitored Patient Re-evaluated:Patient Re-evaluated prior to induction Oxygen Delivery Method: Circle system utilized Preoxygenation: Pre-oxygenation with 100% oxygen Induction Type: IV induction Ventilation: Mask ventilation without difficulty and Oral airway inserted - appropriate to patient size Laryngoscope Size: McGraph and 4 Grade View: Grade II Tube type: Oral Tube size: 7.5 mm Number of attempts: 1 Airway Equipment and Method: Stylet and Oral airway Placement Confirmation: ETT inserted through vocal cords under direct vision,  positive ETCO2 and breath sounds checked- equal and bilateral Secured at: 22 cm Tube secured with: Tape Dental Injury: Teeth and Oropharynx as per pre-operative assessment

## 2020-03-21 NOTE — H&P (Signed)
**Note Timothy-Identified via Obfuscation** [facility] Admission History & Physical  MRN : [id number]  Timothy Clark is a 49 y.o. (01/20/1971) male who presents with chief complaint of No chief complaint on file. [name]  History of Present Illness:  patient is seen for evaluation of dialysis access.  The patient has a history of multiple failed accesses.  There have been access in the left forearm which is non-salvageable. Venous outflow stent no extend to the mid biceps level.   Current access is via a catheter which is functioning adequately.  There have not been any interval episodes of catheter infection.  The patient denies amaurosis fugax or recent TIA symptoms. There are no recent neurological changes noted. The patient denies claudication symptoms or rest pain symptoms. The patient denies history of DVT, PE or superficial thrombophlebitis. The patient denies recent episodes of angina or shortness of breath.   Current Facility-Administered Medications  Medication Dose Route Frequency Provider Last Rate Last Admin  . 0.9 %  sodium chloride infusion   Intravenous Continuous Clark, Timothy Haws, MD      . clindamycin (CLEOCIN) 300 MG/50ML IVPB           . clindamycin (CLEOCIN) IVPB 300 mg  300 mg Intravenous On Call to [place], NP        Past Medical History:  Diagnosis Date  . Anemia   . Anxiety   . CHF (congestive heart failure) (HCC)    diastolic dysfunction  . Chronic abdominal pain   . COVID-19 07/2019  . Depression   . Esophagitis   . ESRD on hemodialysis ([facility])   . Eye hemorrhage   . Gastritis   . Gastroparesis   . GERD (gastroesophageal reflux disease)   . Glaucoma   . Helicobacter pylori (H. pylori) infection 2016   ERADICATION DOCUMENTED VIA STOOL TEST in AUG 2016 at [facility]  . Hiccups   . Hypertension   . Nausea and vomiting    chronic, recurrent  . Neuropathy    feet  . Renal insufficiency   . Type 2 diabetes mellitus with complications (HCC)    diagnosed  around age 32    Past Surgical History:  Procedure Laterality Date  . AMPUTATION TOE Right    great toe  . AV FISTULA INSERTION W/ RF MAGNETIC GUIDANCE Left 04/06/2019   Procedure: AV FISTULA INSERTION W/RF MAGNETIC GUIDANCE;  Surgeon: Timothy Cabal, MD;  Location: [facility];  Service: Cardiovascular;  Laterality: Left;  . AV FISTULA PLACEMENT Left 05/06/2019   Procedure: INSERTION OF ARTERIOVENOUS (AV) GORE-TEX GRAFT ARM ( FOREARM LOOP );  Surgeon: Timothy Cabal, MD;  Location: [facility];  Service: Vascular;  Laterality: Left;  . CATARACT EXTRACTION W/PHACO Left 05/19/2016   Procedure: CATARACT EXTRACTION PHACO AND INTRAOCULAR LENS PLACEMENT LEFT EYE;  Surgeon: Timothy Branch, MD;  Location: [facility];  Service: Ophthalmology;  Laterality: Left;  CDE: 7.30  . CATARACT EXTRACTION W/PHACO Right 06/23/2016   Procedure: CATARACT EXTRACTION PHACO AND INTRAOCULAR LENS PLACEMENT RIGHT EYE CDE=9.87;  Surgeon: Timothy Branch, MD;  Location: [facility];  Service: Ophthalmology;  Laterality: Right;  right  . DIALYSIS/PERMA CATHETER REMOVAL N/A 07/19/2019   Procedure: DIALYSIS/PERMA CATHETER REMOVAL;  Surgeon: Timothy Cabal, MD;  Location: [facility];  Service: Cardiovascular;  Laterality: N/A;  . ESOPHAGOGASTRODUODENOSCOPY  2015   Dr. Britta Clark  . ESOPHAGOGASTRODUODENOSCOPY N/A 10/26/2014   RMR: Distal esophagititis-likely reflux related although an element of pill induced injuruy no exclueded  status post biopsy. Diffusely abnormal gastric mucosa of uncertain signigicane -status post gastric biopsy. Focal area of excoriation in the cardia most consistant with a trauma of heaving.   . ESOPHAGOGASTRODUODENOSCOPY  08/2015   [facility]: mild esophagitis and old gastric contents  . EYE SURGERY Left 2015   stent placed to left eye  . EYE SURGERY Right 06/2019   per patient- to remove scar tissue  . INCISION AND DRAINAGE OF WOUND Right 07/16/2017   Procedure: IRRIGATION AND DEBRIDEMENT OF SOFT  TISSUE OF ULCERATION RIGHT FOOT;  Surgeon: Timothy Clark, DPM;  Location: [facility];  Service: Podiatry;  Laterality: Right;  . PERIPHERAL VASCULAR THROMBECTOMY Left 09/27/2019   Procedure: PERIPHERAL VASCULAR THROMBECTOMY;  Surgeon: Timothy Cabal, MD;  Location: [facility];  Service: Cardiovascular;  Laterality: Left;  . PERIPHERAL VASCULAR THROMBECTOMY Left 10/27/2019   Procedure: PERIPHERAL VASCULAR THROMBECTOMY;  Surgeon: Timothy Huxley, MD;  Location: [facility];  Service: Cardiovascular;  Laterality: Left;  . PERIPHERAL VASCULAR THROMBECTOMY Left 12/13/2019   Procedure: PERIPHERAL VASCULAR THROMBECTOMY;  Surgeon: Timothy Cabal, MD;  Location: [facility];  Service: Cardiovascular;  Laterality: Left;    Social History Social History   Tobacco Use  . Smoking status: Never Smoker  . Smokeless tobacco: Never Used  Vaping Use  . Vaping Use: Never used  Substance Use Topics  . Alcohol use: No    Alcohol/week: 0.0 standard drinks  . Drug use: No    Family History Family History  Problem Relation Age of Onset  . Ovarian cancer Mother   . Heart attack Father   . Colon polyps Father   . Cervical cancer Sister   . Diabetes Sister   . Colon cancer Neg Hx     No family history of bleeding or clotting disorders, autoimmune disease or porphyria  Allergies  Allergen Reactions  . Donnatal [Phenobarbital-Belladonna Alk] Anaphylaxis and Other (See Comments)    Reaction to GI cocktail  . Fish Allergy Anaphylaxis, Hives and Rash  . Haldol [Haloperidol Lactate] Other (See Comments)    Chest Pain  . Maalox [Calcium Carbonate Antacid] Anaphylaxis and Other (See Comments)    Reaction to GI cocktail  . Shellfish Allergy Hives    Per patient: Patient stated he breaks out in hives when eating shellfish  . Aspirin Hives and Itching  . Keflex [Cephalexin] Hives, Itching and Nausea And Vomiting  . Marinol [Dronabinol] Nausea And Vomiting and Other (See  Comments)    Caused worsening vomiting.   [name] Penicillins Hives, Itching and Other (See Comments)    Has patient had a PCN reaction causing immediate rash, facial/tongue/throat swelling, SOB or lightheadedness with hypotension: yes Has patient had a PCN reaction causing severe rash involving mucus membranes or skin necrosis: yes Has patient had a PCN reaction that required hospitalization no Has patient had a PCN reaction occurring within the last 10 years: yes If all of the above answers are "NO", then may proceed with Cephalospor  . Bactrim [Sulfamethoxazole-Trimethoprim] Itching     REVIEW OF SYSTEMS (Negative unless checked)  Constitutional: []Weight loss  []Fever  []Chills Cardiac: []Chest pain   []Chest pressure   []Palpitations   []Shortness of breath when laying flat   []Shortness of breath at rest   [x]Shortness of breath with exertion. Vascular:  []Pain in legs with walking   []Pain in legs at rest   []Pain in legs when laying flat   []Claudication   []Pain in feet  when walking  []Pain in feet at rest  []Pain in feet when laying flat   []History of DVT   []Phlebitis   []Swelling in legs   []Varicose veins   []Non-healing ulcers Pulmonary:   []Uses home oxygen   []Productive cough   []Hemoptysis   []Wheeze  []COPD   []Asthma Neurologic:  []Dizziness  []Blackouts   []Seizures   []History of stroke   []History of TIA  []Aphasia   []Temporary blindness   []Dysphagia   []Weakness or numbness in arms   []Weakness or numbness in legs Musculoskeletal:  []Arthritis   []Joint swelling   []Joint pain   []Low back pain Hematologic:  []Easy bruising  []Easy bleeding   []Hypercoagulable state   []Anemic  []Hepatitis Gastrointestinal:  []Blood in stool   []Vomiting blood  []Gastroesophageal reflux/heartburn   []Difficulty swallowing. Genitourinary:  [x]Chronic kidney disease   []Difficult urination  []Frequent urination  []Burning with urination   []Blood in urine Skin:  []Rashes   []Ulcers    []Wounds Psychological:  []History of anxiety   [] History of major depression.  Physical Examination  Vitals:   03/21/20 1022  BP: (!) 150/86  Pulse: 71  Resp: 16  Temp: 98.4 F (36.9 C)  TempSrc: Oral  SpO2: 100%   There is no height or weight on file to calculate BMI. Gen: WD/WN, NAD Head: [place]/AT, No temporalis wasting. Prominent temp pulse not noted. Ear/Nose/Throat: Hearing grossly intact, nares w/o erythema or drainage, oropharynx w/o Erythema/Exudate,  Eyes: Conjunctiva clear, sclera non-icteric Neck: Trachea midline.  No JVD.  Pulmonary:  Good air movement, respirations not labored, no use of accessory muscles.  Cardiac: RRR, normal S1, S2. Vascular: left forearm loop graft no thrill no bruit; right IJ catheter CD&I Vessel Right Left  Radial Palpable Palpable  Ulnar Not Palpable Not Palpable  Brachial Palpable Palpable  Carotid Palpable, without bruit Palpable, without bruit  Gastrointestinal: soft, non-tender/non-distended. No guarding/reflex.  Musculoskeletal: M/S 5/5 throughout.  Extremities without ischemic changes.  No deformity or atrophy.  Neurologic: Sensation grossly intact in extremities.  Symmetrical.  Speech is fluent. Motor exam as listed above. Psychiatric: Judgment intact, Mood & affect appropriate for pt's clinical situation. Dermatologic: No rashes or ulcers noted.  No cellulitis or open wounds.    CBC [facility] Results  Component Value Date   WBC 6.2 03/20/2020   HGB 11.1 (L) 03/20/2020   HCT 33.3 (L) 03/20/2020   MCV 97.4 03/20/2020   PLT 144 (L) 03/20/2020    BMET    Component Value Date/Time   NA 133 (L) 03/20/2020 1127   K 3.2 (L) 03/20/2020 1127   CL 93 (L) 03/20/2020 1127   CO2 25 03/20/2020 1127   GLUCOSE 237 (H) 03/20/2020 1127   BUN 35 (H) 03/20/2020 1127   CREATININE 5.85 (H) 03/20/2020 1127   CREATININE 1.20 06/22/2017 1436   CALCIUM 8.9 03/20/2020 1127   GFRNONAA 10 (L) 03/20/2020 1127   GFRAA 12 (L) 03/20/2020 1127    Estimated Creatinine Clearance: 20.6 mL/min (A) (by C-G formula based on SCr of 5.85 mg/dL (H)).  COAG [facility] Results  Component Value Date   INR 1.0 03/20/2020   INR 1.2 03/01/2020   INR 1.0 02/16/2020    Radiology No results found.  Assessment/Plan 1. Complication of vascular access for dialysis, sequela Recommend:  At this time the patient does not have appropriate extremity access for dialysis.  His forearm loop graft is thrombosed and the venous outflow stents extend to  the mid biceps level.  Further stenting proximal will prevent creation of a brachial axillary graft as the axillary vein would be stented.  Given this we have decided to move forward with a new brachial axillary graft as this would be much more durable.   Patient should have a left brachial axillary graft created.  The risks, benefits and alternative therapies were reviewed in detail with the patient.  All questions were answered.  The patient agrees to proceed with surgery.    2. ESRD on dialysis ([place]) At the present time the patient has adequate dialysis access.  Continue hemodialysis as ordered without interruption.  Avoid nephrotoxic medications and dehydration.  Further plans per nephrology  3. Diabetes mellitus type 2 with complications, uncontrolled ([place]) Continue hypoglycemic medications as already ordered, these medications have been reviewed and there are no changes at this time.  Hgb A1C to be monitored as already arranged by primary service   4. Essential hypertension Continue antihypertensive medications as already ordered, these medications have been reviewed and there are no changes at this time.   5. Gastroesophageal reflux disease, unspecified whether esophagitis present Continue PPI as already ordered, this medication has been reviewed and there are no changes at this time.  Avoidence of caffeine and alcohol  Moderate elevation of the head of the bed    [name], MD  03/21/2020 11:01 AM

## 2020-03-22 ENCOUNTER — Encounter (INDEPENDENT_AMBULATORY_CARE_PROVIDER_SITE_OTHER): Payer: Medicare HMO | Admitting: Ophthalmology

## 2020-03-22 DIAGNOSIS — Z992 Dependence on renal dialysis: Secondary | ICD-10-CM | POA: Diagnosis not present

## 2020-03-22 DIAGNOSIS — N186 End stage renal disease: Secondary | ICD-10-CM | POA: Diagnosis not present

## 2020-03-22 NOTE — Anesthesia Postprocedure Evaluation (Signed)
Anesthesia Post Note  Patient: GIONNI VACA  Procedure(s) Performed: INSERTION OF ARTERIOVENOUS (AV) GORE-TEX GRAFT ARM ( BRACHIAL AXILLARY ) (Left )  Patient location during evaluation: PACU Anesthesia Type: General Level of consciousness: awake and alert Pain management: pain level controlled Vital Signs Assessment: post-procedure vital signs reviewed and stable Respiratory status: spontaneous breathing, nonlabored ventilation, respiratory function stable and patient connected to nasal cannula oxygen Cardiovascular status: blood pressure returned to baseline and stable Postop Assessment: no apparent nausea or vomiting Anesthetic complications: no   No complications documented.   Last Vitals:  Vitals:   03/21/20 1530 03/21/20 1605  BP: (!) 146/62 (!) 136/58  Pulse: 77 74  Resp: 18 18  Temp: 36.4 C   SpO2: 100% 100%    Last Pain:  Vitals:   03/21/20 1605  TempSrc:   PainSc: 0-No pain                 Precious Haws Tiani Stanbery

## 2020-03-23 DIAGNOSIS — N186 End stage renal disease: Secondary | ICD-10-CM | POA: Diagnosis not present

## 2020-03-23 DIAGNOSIS — Z992 Dependence on renal dialysis: Secondary | ICD-10-CM | POA: Diagnosis not present

## 2020-03-26 DIAGNOSIS — N186 End stage renal disease: Secondary | ICD-10-CM | POA: Diagnosis not present

## 2020-03-26 DIAGNOSIS — Z992 Dependence on renal dialysis: Secondary | ICD-10-CM | POA: Diagnosis not present

## 2020-03-28 DIAGNOSIS — N186 End stage renal disease: Secondary | ICD-10-CM | POA: Diagnosis not present

## 2020-03-28 DIAGNOSIS — Z992 Dependence on renal dialysis: Secondary | ICD-10-CM | POA: Diagnosis not present

## 2020-03-29 ENCOUNTER — Encounter (INDEPENDENT_AMBULATORY_CARE_PROVIDER_SITE_OTHER): Payer: Self-pay | Admitting: Ophthalmology

## 2020-03-29 ENCOUNTER — Other Ambulatory Visit: Payer: Self-pay

## 2020-03-29 ENCOUNTER — Ambulatory Visit (INDEPENDENT_AMBULATORY_CARE_PROVIDER_SITE_OTHER): Payer: Medicare HMO | Admitting: Ophthalmology

## 2020-03-29 DIAGNOSIS — E113512 Type 2 diabetes mellitus with proliferative diabetic retinopathy with macular edema, left eye: Secondary | ICD-10-CM

## 2020-03-29 MED ORDER — BEVACIZUMAB CHEMO INJECTION 1.25MG/0.05ML SYRINGE FOR KALEIDOSCOPE
1.2500 mg | INTRAVITREAL | Status: AC | PRN
  Administered 2020-03-29: 1.25 mg via INTRAVITREAL

## 2020-03-29 NOTE — Progress Notes (Signed)
03/29/2020     CHIEF COMPLAINT Patient presents for Retina Follow Up   HISTORY OF PRESENT ILLNESS: Timothy Clark is a 49 y.o. male who presents to the clinic today for:   HPI    Retina Follow Up    Patient presents with  Diabetic Retinopathy.  In left eye.  This started 10 weeks ago.  Severity is moderate.  Duration of 10 weeks.  Since onset it is gradually improving.          Comments    10 Week Diabetic F/U OS, poss Avastin OS  Pt sts VA OS overall has improved since last visit, but sts VA is a little dark this AM. OD VA stable. LBS: 96 this AM       Last edited by Rockie Neighbours, [place] on 03/29/2020  9:41 AM. (History)      Referring physician: [name], MD [place],  [place] [id number]  HISTORICAL INFORMATION:   Selected notes from the MEDICAL RECORD NUMBER    Lab Results  Component Value Date   HGBA1C 8.6 (H) 03/01/2020     CURRENT MEDICATIONS: Current Outpatient Medications (Ophthalmic Drugs)  Medication Sig  . brimonidine (ALPHAGAN) 0.15 % ophthalmic solution Place 1 drop into both eyes in the morning and at bedtime.   . dorzolamide-timolol (COSOPT) 22.3-6.8 MG/ML ophthalmic solution Place 1 drop into both eyes 2 (two) times daily.    No current facility-administered medications for this visit. (Ophthalmic Drugs)   Current Outpatient Medications (Other)  Medication Sig  . ALPRAZolam (XANAX) 0.5 MG tablet Take 0.5 mg by mouth 2 (two) times daily as needed for anxiety.   [name] amLODipine (NORVASC) 5 MG tablet Take 5 mg by mouth daily.  [name] atorvastatin (LIPITOR) 10 MG tablet Take 10 mg by mouth at bedtime.   . carvedilol (COREG) 25 MG tablet Take 25 mg by mouth 2 (two) times daily with a meal.  . cloNIDine (CATAPRES) 0.2 MG tablet Take 0.2 mg by mouth 2 (two) times daily.   . cyanocobalamin (,VITAMIN B-12,) 1000 MCG/ML injection Inject 1,000 mcg into the muscle every 30 (thirty) days.   [name] dicyclomine (BENTYL) 10 MG capsule Take 1 capsule (10  mg total) by mouth every 6 (six) hours as needed (abd pain and spasm.).  [name] folic acid (FOLVITE) 1 MG tablet Take 1 mg by mouth daily.  . metoCLOPramide (REGLAN) 5 MG tablet Take 1 tablet (5 mg total) by mouth every 12 (twelve) hours.  [name] NOVOLOG MIX 70/30 FLEXPEN (70-30) 100 UNIT/ML FlexPen 35-55 Units by Subconjunctival route See admin instructions. Inject 55 units subcutaneously in the morning & inject 35 units subcutaneously in the evening.  [name] oxyCODONE-acetaminophen (PERCOCET) 5-325 MG tablet Take 1-2 tablets by mouth every 6 (six) hours as needed for moderate pain or severe pain.  . pantoprazole (PROTONIX) 40 MG tablet Take 1 tablet (40 mg total) by mouth 2 (two) times daily before a meal.  . sertraline (ZOLOFT) 100 MG tablet Take 100 mg by mouth daily.  . sitaGLIPtin (JANUVIA) 25 MG tablet Take 25 mg by mouth daily.   . sucralfate (CARAFATE) 1 g tablet Take 1 g by mouth in the morning, at noon, and at bedtime.   . torsemide (DEMADEX) 20 MG tablet Take 40 mg by mouth daily.   No current facility-administered medications for this visit. (Other)      REVIEW OF SYSTEMS:    ALLERGIES Allergies  Allergen Reactions  . Donnatal [Phenobarbital-Belladonna  Alk] Anaphylaxis and Other (See Comments)    Reaction to GI cocktail  . Fish Allergy Anaphylaxis, Hives and Rash  . Haldol [Haloperidol Lactate] Other (See Comments)    Chest Pain  . Maalox [Calcium Carbonate Antacid] Anaphylaxis and Other (See Comments)    Reaction to GI cocktail  . Shellfish Allergy Hives    Per patient: Patient stated he breaks out in hives when eating shellfish  . Aspirin Hives and Itching  . Keflex [Cephalexin] Hives, Itching and Nausea And Vomiting  . Marinol [Dronabinol] Nausea And Vomiting and Other (See Comments)    Caused worsening vomiting.   [name] Penicillins Hives, Itching and Other (See Comments)    Has patient had a PCN reaction causing immediate rash, facial/tongue/throat swelling, SOB or lightheadedness  with hypotension: yes Has patient had a PCN reaction causing severe rash involving mucus membranes or skin necrosis: yes Has patient had a PCN reaction that required hospitalization no Has patient had a PCN reaction occurring within the last 10 years: yes If all of the above answers are "NO", then may proceed with Cephalospor  . Bactrim [Sulfamethoxazole-Trimethoprim] Itching    PAST MEDICAL HISTORY Past Medical History:  Diagnosis Date  . Anemia   . Anxiety   . CHF (congestive heart failure) (HCC)    diastolic dysfunction  . Chronic abdominal pain   . COVID-19 07/2019  . Depression   . Esophagitis   . ESRD on hemodialysis ([place])   . Eye hemorrhage   . Gastritis   . Gastroparesis   . GERD (gastroesophageal reflux disease)   . Glaucoma   . Helicobacter pylori (H. pylori) infection 2016   ERADICATION DOCUMENTED VIA STOOL TEST in AUG 2016 at [place] [facility]  . Hiccups   . Hypertension   . Nausea and vomiting    chronic, recurrent  . Neuropathy    feet  . Renal insufficiency   . Type 2 diabetes mellitus with complications (HCC)    diagnosed around age 43   Past Surgical History:  Procedure Laterality Date  . AMPUTATION TOE Right    great toe  . AV FISTULA INSERTION W/ RF MAGNETIC GUIDANCE Left 04/06/2019   Procedure: AV FISTULA INSERTION W/RF MAGNETIC GUIDANCE;  Surgeon: Katha Cabal, MD;  Location: [place] [facility];  Service: Cardiovascular;  Laterality: Left;  . AV FISTULA PLACEMENT Left 05/06/2019   Procedure: INSERTION OF ARTERIOVENOUS (AV) GORE-TEX GRAFT ARM ( FOREARM LOOP );  Surgeon: Katha Cabal, MD;  Location: [facility];  Service: Vascular;  Laterality: Left;  . AV FISTULA PLACEMENT Left 03/21/2020   Procedure: INSERTION OF ARTERIOVENOUS (AV) GORE-TEX GRAFT ARM ( BRACHIAL AXILLARY );  Surgeon: Katha Cabal, MD;  Location: [facility];  Service: Vascular;  Laterality: Left;  . CATARACT EXTRACTION W/PHACO Left 05/19/2016   Procedure: CATARACT EXTRACTION PHACO  AND INTRAOCULAR LENS PLACEMENT LEFT EYE;  Surgeon: Tonny Branch, MD;  [facility];  Service: Ophthalmology;  Laterality: Left;  CDE: 7.30  . CATARACT EXTRACTION W/PHACO Right 06/23/2016   Procedure: CATARACT EXTRACTION PHACO AND INTRAOCULAR LENS PLACEMENT RIGHT EYE CDE=9.87;  Surgeon: Tonny Branch, MD;  [facility];  Service: Ophthalmology;  Laterality: Right;  right  . DIALYSIS/PERMA CATHETER REMOVAL N/A 07/19/2019   Procedure: DIALYSIS/PERMA CATHETER REMOVAL;  Surgeon: Katha Cabal, MD;  Location: [place] [facility];  Service: Cardiovascular;  Laterality: N/A;  . ESOPHAGOGASTRODUODENOSCOPY  2015   Dr. Britta Mccreedy  . ESOPHAGOGASTRODUODENOSCOPY N/A 10/26/2014   RMR: Distal esophagititis-likely reflux related although an  element of pill induced injuruy no exclueded  status post biopsy. Diffusely abnormal gastric mucosa of uncertain signigicane -status post gastric biopsy. Focal area of excoriation in the cardia most consistant with a trauma of heaving.   . ESOPHAGOGASTRODUODENOSCOPY  08/2015   [facility]: mild esophagitis and old gastric contents  . EYE SURGERY Left 2015   stent placed to left eye  . EYE SURGERY Right 06/2019   per patient- to remove scar tissue  . INCISION AND DRAINAGE OF WOUND Right 07/16/2017   Procedure: IRRIGATION AND DEBRIDEMENT OF SOFT TISSUE OF ULCERATION RIGHT FOOT;  Surgeon: Caprice Beaver, DPM;  [facility];  Service: Podiatry;  Laterality: Right;  . PERIPHERAL VASCULAR THROMBECTOMY Left 09/27/2019   Procedure: PERIPHERAL VASCULAR THROMBECTOMY;  Surgeon: Katha Cabal, MD;  Location: [place] [facility];  Service: Cardiovascular;  Laterality: Left;  . PERIPHERAL VASCULAR THROMBECTOMY Left 10/27/2019   Procedure: PERIPHERAL VASCULAR THROMBECTOMY;  Surgeon: Algernon Huxley, MD;  Location: [place] [facility];  Service: Cardiovascular;  Laterality: Left;  . PERIPHERAL VASCULAR THROMBECTOMY Left 12/13/2019   Procedure: PERIPHERAL VASCULAR THROMBECTOMY;   Surgeon: Katha Cabal, MD;  Location: [place] [facility];  Service: Cardiovascular;  Laterality: Left;    FAMILY HISTORY Family History  Problem Relation Age of Onset  . Ovarian cancer Mother   . Heart attack Father   . Colon polyps Father   . Cervical cancer Sister   . Diabetes Sister   . Colon cancer Neg Hx     SOCIAL HISTORY Social History   Tobacco Use  . Smoking status: Never Smoker  . Smokeless tobacco: Never Used  Vaping Use  . Vaping Use: Never used  Substance Use Topics  . Alcohol use: No    Alcohol/week: 0.0 standard drinks  . Drug use: No         OPHTHALMIC EXAM:  Base Eye Exam    Visual Acuity (ETDRS)      Right Left   Dist [place] 20/200 20/40   Dist ph [place] NI NI       Tonometry (Tonopen, 9:43 AM)      Right Left   Pressure 10 12       Pupils      Pupils Dark Light Shape React APD   Right PERRL 2 2 Round Minimal None   Left PERRL 2 2 Round Minimal None       Visual Fields (Counting fingers)      Left Right   Restrictions Total superior temporal, inferior temporal, superior nasal, inferior nasal deficiencies Total superior temporal, superior nasal, inferior nasal deficiencies       Extraocular Movement      Right Left    Full Full       Neuro/Psych    Oriented x3: Yes   Mood/Affect: Normal       Dilation    Left eye: 1.0% Mydriacyl, 2.5% Phenylephrine @ 9:44 AM        Slit Lamp and Fundus Exam    External Exam      Right Left   External Normal Normal       Slit Lamp Exam      Right Left   Lids/Lashes  Normal   Conjunctiva/Sclera  White and quiet,, good coverage of tube   Cornea  Clear   Anterior Chamber  Tube shunt enters at 2 o'clock position into anterior chamber   Iris  Round and reactive   Lens  Posterior chamber intraocular lens  Anterior Vitreous  1+ Cells       Fundus Exam      Right Left   Posterior Vitreous  clearing Vitreous hemorrhage   Disc  2+ Pallor, old FPD stable   C/D Ratio  0.55   Macula   Attached,   Vessels  Proliferative diabetic retinopathy, no obvious new neovascularization   Periphery  Completely attached with good PRP moderate haze          IMAGING AND PROCEDURES  Imaging and Procedures for 03/29/20  OCT, Retina - OU - Both Eyes       Right Eye Quality was poor. Scan locations included subfoveal. Central Foveal Thickness: 278. Progression has been stable. Findings include outer retinal atrophy, central retinal atrophy, inner retinal atrophy.   Left Eye Central Foveal Thickness: 303. Findings include cystoid macular edema.   Notes D, with diffuse retinal atrophy which is largely unchanged and reflects near nonperfusion diffusely  OS CME temporally, will retreat today for CSME, at 10-week interval       Intravitreal Injection, Pharmacologic Agent - OS - Left Eye       Time Out 03/29/2020. 10:41 AM. Confirmed correct patient, procedure, site, and patient consented.   Anesthesia Topical anesthesia was used. Anesthetic medications included [place] 3.5%.   Procedure Preparation included Ofloxacin , Tobramycin 0.3%, 5% betadine to ocular surface, 10% betadine to eyelids. A supplied needle was used.   Injection:  1.25 mg Bevacizumab (AVASTIN) SOLN   NDC: [address]-3, Lot: [id number]   Route: Intravitreal, Site: Left Eye, Waste: 0 mg  Post-op Post injection exam found visual acuity of at least counting fingers. The patient tolerated the procedure well. There were no complications. The patient received written and verbal post procedure care education. Post injection medications were not given.                 ASSESSMENT/PLAN:  Proliferative diabetic retinopathy of left eye with macular edema associated with type 2 diabetes mellitus (HCC) CSME improved and stable at 10-week interval, repeated ejection Avastin OS today, will repeat examination in 3 months      ICD-10-CM   1. Proliferative diabetic retinopathy of left eye with macular edema associated  with type 2 diabetes mellitus (HCC)  P32.9518 OCT, Retina - OU - Both Eyes    Intravitreal Injection, Pharmacologic Agent - OS - Left Eye    Bevacizumab (AVASTIN) SOLN 1.25 mg    1.  Repeat injection Avastin OS today and examination repeat OU in 3 months  2.  No planned injection OU next  3.  Ophthalmic Meds Ordered this visit:  Meds ordered this encounter  Medications  . Bevacizumab (AVASTIN) SOLN 1.25 mg       Return in about 3 months (around 06/29/2020) for DILATE OU, OCT, no plan inj.  There are no Patient Instructions on file for this visit.   Explained the diagnoses, plan, and follow up with the patient and they expressed understanding.  Patient expressed understanding of the importance of proper follow up care.   Clent Demark Chrishana Spargur M.D. Diseases & Surgery of the Retina and Vitreous [facility]ina [facility] Diabetic [place] 03/29/20     Abbreviations: M myopia (nearsighted); A astigmatism; H hyperopia (farsighted); P presbyopia; Mrx spectacle prescription;  CTL contact lenses; OD right eye; OS left eye; OU both eyes  XT exotropia; ET esotropia; PEK punctate epithelial keratitis; PEE punctate epithelial erosions; DES dry eye syndrome; MGD meibomian gland dysfunction; ATs artificial tears; PFAT's preservative free artificial tears; [place]  nuclear sclerotic cataract; PSC posterior subcapsular cataract; ERM epi-retinal membrane; PVD posterior vitreous detachment; RD retinal detachment; DM diabetes mellitus; DR diabetic retinopathy; NPDR non-proliferative diabetic retinopathy; PDR proliferative diabetic retinopathy; CSME clinically significant macular edema; DME diabetic macular edema; dbh dot blot hemorrhages; CWS cotton wool spot; POAG primary open angle glaucoma; C/D cup-to-disc ratio; HVF humphrey visual field; GVF goldmann visual field; OCT optical coherence tomography; IOP intraocular pressure; BRVO Branch retinal vein occlusion; CRVO central retinal vein occlusion; CRAO central retinal artery  occlusion; BRAO branch retinal artery occlusion; RT retinal tear; SB scleral buckle; PPV pars plana vitrectomy; VH Vitreous hemorrhage; PRP panretinal laser photocoagulation; IVK intravitreal kenalog; VMT vitreomacular traction; MH Macular hole;  NVD neovascularization of the disc; NVE neovascularization elsewhere; AREDS age related eye disease study; ARMD age related macular degeneration; POAG primary open angle glaucoma; EBMD epithelial/anterior basement membrane dystrophy; ACIOL anterior chamber intraocular lens; IOL intraocular lens; PCIOL posterior chamber intraocular lens; Phaco/IOL phacoemulsification with intraocular lens placement; [place] photorefractive keratectomy; LASIK laser assisted in situ keratomileusis; HTN hypertension; DM diabetes mellitus; COPD chronic obstructive pulmonary disease

## 2020-03-29 NOTE — Assessment & Plan Note (Signed)
CSME improved and stable at 10-week interval, repeated ejection Avastin OS today, will repeat examination in 3 months

## 2020-03-30 DIAGNOSIS — N186 End stage renal disease: Secondary | ICD-10-CM | POA: Diagnosis not present

## 2020-03-30 DIAGNOSIS — Z992 Dependence on renal dialysis: Secondary | ICD-10-CM | POA: Diagnosis not present

## 2020-04-02 DIAGNOSIS — Z992 Dependence on renal dialysis: Secondary | ICD-10-CM | POA: Diagnosis not present

## 2020-04-02 DIAGNOSIS — N186 End stage renal disease: Secondary | ICD-10-CM | POA: Diagnosis not present

## 2020-04-04 DIAGNOSIS — Z992 Dependence on renal dialysis: Secondary | ICD-10-CM | POA: Diagnosis not present

## 2020-04-04 DIAGNOSIS — N186 End stage renal disease: Secondary | ICD-10-CM | POA: Diagnosis not present

## 2020-04-05 DIAGNOSIS — H401190 Primary open-angle glaucoma, unspecified eye, stage unspecified: Secondary | ICD-10-CM | POA: Diagnosis not present

## 2020-04-05 DIAGNOSIS — F419 Anxiety disorder, unspecified: Secondary | ICD-10-CM | POA: Diagnosis not present

## 2020-04-05 DIAGNOSIS — I129 Hypertensive chronic kidney disease with stage 1 through stage 4 chronic kidney disease, or unspecified chronic kidney disease: Secondary | ICD-10-CM | POA: Diagnosis not present

## 2020-04-05 DIAGNOSIS — I10 Essential (primary) hypertension: Secondary | ICD-10-CM | POA: Diagnosis not present

## 2020-04-05 DIAGNOSIS — K219 Gastro-esophageal reflux disease without esophagitis: Secondary | ICD-10-CM | POA: Diagnosis not present

## 2020-04-05 DIAGNOSIS — E7849 Other hyperlipidemia: Secondary | ICD-10-CM | POA: Diagnosis not present

## 2020-04-05 DIAGNOSIS — E1122 Type 2 diabetes mellitus with diabetic chronic kidney disease: Secondary | ICD-10-CM | POA: Diagnosis not present

## 2020-04-05 DIAGNOSIS — Z0001 Encounter for general adult medical examination with abnormal findings: Secondary | ICD-10-CM | POA: Diagnosis not present

## 2020-04-05 DIAGNOSIS — D509 Iron deficiency anemia, unspecified: Secondary | ICD-10-CM | POA: Diagnosis not present

## 2020-04-05 DIAGNOSIS — E785 Hyperlipidemia, unspecified: Secondary | ICD-10-CM | POA: Diagnosis not present

## 2020-04-06 DIAGNOSIS — Z992 Dependence on renal dialysis: Secondary | ICD-10-CM | POA: Diagnosis not present

## 2020-04-06 DIAGNOSIS — N186 End stage renal disease: Secondary | ICD-10-CM | POA: Diagnosis not present

## 2020-04-09 DIAGNOSIS — Z992 Dependence on renal dialysis: Secondary | ICD-10-CM | POA: Diagnosis not present

## 2020-04-09 DIAGNOSIS — N186 End stage renal disease: Secondary | ICD-10-CM | POA: Diagnosis not present

## 2020-04-10 ENCOUNTER — Other Ambulatory Visit (INDEPENDENT_AMBULATORY_CARE_PROVIDER_SITE_OTHER): Payer: Self-pay | Admitting: Vascular Surgery

## 2020-04-10 ENCOUNTER — Ambulatory Visit (INDEPENDENT_AMBULATORY_CARE_PROVIDER_SITE_OTHER): Payer: Medicare HMO

## 2020-04-10 ENCOUNTER — Encounter (INDEPENDENT_AMBULATORY_CARE_PROVIDER_SITE_OTHER): Payer: Self-pay | Admitting: Vascular Surgery

## 2020-04-10 ENCOUNTER — Ambulatory Visit (INDEPENDENT_AMBULATORY_CARE_PROVIDER_SITE_OTHER): Payer: Medicare HMO | Admitting: Vascular Surgery

## 2020-04-10 ENCOUNTER — Other Ambulatory Visit: Payer: Self-pay

## 2020-04-10 VITALS — BP 101/64 | HR 70 | Resp 16 | Wt 287.8 lb

## 2020-04-10 DIAGNOSIS — Z95828 Presence of other vascular implants and grafts: Secondary | ICD-10-CM

## 2020-04-10 DIAGNOSIS — N186 End stage renal disease: Secondary | ICD-10-CM

## 2020-04-10 DIAGNOSIS — Z992 Dependence on renal dialysis: Secondary | ICD-10-CM

## 2020-04-10 DIAGNOSIS — Z9889 Other specified postprocedural states: Secondary | ICD-10-CM | POA: Diagnosis not present

## 2020-04-10 NOTE — Progress Notes (Signed)
Patient ID: Timothy Clark, male   DOB: [birth year], 49 y.o.   MRN: [id number]  Chief Complaint  Patient presents with  . Follow-up    [facility] 2wk post insertion of av graft    HPI Timothy Clark is a 49 y.o. male.  Patient returns about 2 to 3 weeks after left brachial artery to axillary vein AV graft creation by my partner.  He is doing well.  His incisions are healing well.  He has mild arm swelling but this is normal postop.  Duplex today shows some mildly elevated velocities at the arterial anastomosis but using a tapered graft that is not surprising.  The graft appears to be otherwise patent.   Past Medical History:  Diagnosis Date  . Anemia   . Anxiety   . CHF (congestive heart failure) (HCC)    diastolic dysfunction  . Chronic abdominal pain   . COVID-19 07/2019  . Depression   . Esophagitis   . ESRD on hemodialysis ([place])   . Eye hemorrhage   . Gastritis   . Gastroparesis   . GERD (gastroesophageal reflux disease)   . Glaucoma   . Helicobacter pylori (H. pylori) infection 2016   ERADICATION DOCUMENTED VIA STOOL TEST in AUG 2016 at [place]  . Hiccups   . Hypertension   . Nausea and vomiting    chronic, recurrent  . Neuropathy    feet  . Renal insufficiency   . Type 2 diabetes mellitus with complications (HCC)    diagnosed around age 48    Past Surgical History:  Procedure Laterality Date  . AMPUTATION TOE Right    great toe  . AV FISTULA INSERTION W/ RF MAGNETIC GUIDANCE Left 04/06/2019   Procedure: AV FISTULA INSERTION W/RF MAGNETIC GUIDANCE;  Surgeon: Katha Cabal, MD;  Location: [place] [facility];  Service: Cardiovascular;  Laterality: Left;  . AV FISTULA PLACEMENT Left 05/06/2019   Procedure: INSERTION OF ARTERIOVENOUS (AV) GORE-TEX GRAFT ARM ( FOREARM LOOP );  Surgeon: Katha Cabal, MD;  Location: [facility];  Service: Vascular;  Laterality: Left;  . AV FISTULA PLACEMENT Left 03/21/2020   Procedure: INSERTION OF ARTERIOVENOUS (AV) GORE-TEX  GRAFT ARM ( BRACHIAL AXILLARY );  Surgeon: Katha Cabal, MD;  Location: [facility];  Service: Vascular;  Laterality: Left;  . CATARACT EXTRACTION W/PHACO Left 05/19/2016   Procedure: CATARACT EXTRACTION PHACO AND INTRAOCULAR LENS PLACEMENT LEFT EYE;  Surgeon: Tonny Branch, MD;  [facility];  Service: Ophthalmology;  Laterality: Left;  CDE: 7.30  . CATARACT EXTRACTION W/PHACO Right 06/23/2016   Procedure: CATARACT EXTRACTION PHACO AND INTRAOCULAR LENS PLACEMENT RIGHT EYE CDE=9.87;  Surgeon: Tonny Branch, MD;  [facility];  Service: Ophthalmology;  Laterality: Right;  right  . DIALYSIS/PERMA CATHETER REMOVAL N/A 07/19/2019   Procedure: DIALYSIS/PERMA CATHETER REMOVAL;  Surgeon: Katha Cabal, MD;  Location: [place] [facility];  Service: Cardiovascular;  Laterality: N/A;  . ESOPHAGOGASTRODUODENOSCOPY  2015   Dr. Britta Mccreedy  . ESOPHAGOGASTRODUODENOSCOPY N/A 10/26/2014   RMR: Distal esophagititis-likely reflux related although an element of pill induced injuruy no exclueded  status post biopsy. Diffusely abnormal gastric mucosa of uncertain signigicane -status post gastric biopsy. Focal area of excoriation in the cardia most consistant with a trauma of heaving.   . ESOPHAGOGASTRODUODENOSCOPY  08/2015   [facility]: mild esophagitis and old gastric contents  . EYE SURGERY Left 2015   stent placed to left eye  . EYE SURGERY Right 06/2019   per patient-  to remove scar tissue  . INCISION AND DRAINAGE OF WOUND Right 07/16/2017   Procedure: IRRIGATION AND DEBRIDEMENT OF SOFT TISSUE OF ULCERATION RIGHT FOOT;  Surgeon: Caprice Beaver, DPM;  [facility];  Service: Podiatry;  Laterality: Right;  . PERIPHERAL VASCULAR THROMBECTOMY Left 09/27/2019   Procedure: PERIPHERAL VASCULAR THROMBECTOMY;  Surgeon: Katha Cabal, MD;  Location: [place] [facility];  Service: Cardiovascular;  Laterality: Left;  . PERIPHERAL VASCULAR THROMBECTOMY Left 10/27/2019   Procedure: PERIPHERAL VASCULAR  THROMBECTOMY;  Surgeon: Algernon Huxley, MD;  Location: [place] [facility];  Service: Cardiovascular;  Laterality: Left;  . PERIPHERAL VASCULAR THROMBECTOMY Left 12/13/2019   Procedure: PERIPHERAL VASCULAR THROMBECTOMY;  Surgeon: Katha Cabal, MD;  Location: [place] [facility];  Service: Cardiovascular;  Laterality: Left;      Allergies  Allergen Reactions  . Donnatal [Phenobarbital-Belladonna Alk] Anaphylaxis and Other (See Comments)    Reaction to GI cocktail  . Fish Allergy Anaphylaxis, Hives and Rash  . Haldol [Haloperidol Lactate] Other (See Comments)    Chest Pain  . Maalox [Calcium Carbonate Antacid] Anaphylaxis and Other (See Comments)    Reaction to GI cocktail  . Shellfish Allergy Hives    Per patient: Patient stated he breaks out in hives when eating shellfish  . Aspirin Hives and Itching  . Doxycycline Hives, Nausea And Vomiting and Other (See Comments)    Severe   . Keflex [Cephalexin] Hives, Itching and Nausea And Vomiting  . Marinol [Dronabinol] Nausea And Vomiting and Other (See Comments)    Caused worsening vomiting.   . Other Swelling  . Penicillins Hives, Itching, Other (See Comments) and Rash    Has patient had a PCN reaction causing immediate rash, facial/tongue/throat swelling, SOB or lightheadedness with hypotension: yes Has patient had a PCN reaction causing severe rash involving mucus membranes or skin necrosis: yes Has patient had a PCN reaction that required hospitalization no Has patient had a PCN reaction occurring within the last 10 years: yes If all of the above answers are "NO", then may proceed with Cephalospor  . Bactrim [Sulfamethoxazole-Trimethoprim] Itching    Current Outpatient Medications  Medication Sig Dispense Refill  . ALPRAZolam (XANAX) 0.5 MG tablet Take 0.5 mg by mouth 2 (two) times daily as needed for anxiety.     [name] amLODipine (NORVASC) 5 MG tablet Take 5 mg by mouth daily.    [name] atorvastatin (LIPITOR) 10 MG tablet Take 10 mg by  mouth at bedtime.     . brimonidine (ALPHAGAN) 0.15 % ophthalmic solution Place 1 drop into both eyes in the morning and at bedtime.     . carvedilol (COREG) 25 MG tablet Take 25 mg by mouth 2 (two) times daily with a meal.    . cloNIDine (CATAPRES) 0.2 MG tablet Take 0.2 mg by mouth 2 (two) times daily.     . cyanocobalamin (,VITAMIN B-12,) 1000 MCG/ML injection Inject 1,000 mcg into the muscle every 30 (thirty) days.   1  . dorzolamide-timolol (COSOPT) 22.3-6.8 MG/ML ophthalmic solution Place 1 drop into both eyes 2 (two) times daily.     . folic acid (FOLVITE) 1 MG tablet Take 1 mg by mouth daily.    . metoCLOPramide (REGLAN) 5 MG tablet Take 1 tablet (5 mg total) by mouth every 12 (twelve) hours. 60 tablet 1  . NOVOLOG MIX 70/30 FLEXPEN (70-30) 100 UNIT/ML FlexPen 35-55 Units by Subconjunctival route See admin instructions. Inject 55 units subcutaneously in the morning & inject 35 units  subcutaneously in the evening.    [name] oxyCODONE-acetaminophen (PERCOCET) 5-325 MG tablet Take 1-2 tablets by mouth every 6 (six) hours as needed for moderate pain or severe pain. 36 tablet 0  . pantoprazole (PROTONIX) 40 MG tablet Take 1 tablet (40 mg total) by mouth 2 (two) times daily before a meal. 180 tablet 3  . sertraline (ZOLOFT) 100 MG tablet Take 100 mg by mouth daily.    . sitaGLIPtin (JANUVIA) 25 MG tablet Take 25 mg by mouth daily.     . sucralfate (CARAFATE) 1 g tablet Take 1 g by mouth in the morning, at noon, and at bedtime.     . torsemide (DEMADEX) 20 MG tablet Take 40 mg by mouth daily.    [name] dicyclomine (BENTYL) 10 MG capsule Take 1 capsule (10 mg total) by mouth every 6 (six) hours as needed (abd pain and spasm.). 60 capsule 0   No current facility-administered medications for this visit.        Physical Exam BP 101/64 (BP Location: Right Arm)   Pulse 70   Resp 16   Wt 287 lb 12.8 oz (130.5 kg)   BMI 40.14 kg/m  Gen:  WD/WN, NAD Skin: incision C/D/I. Good thrill in left  AVG.     Assessment/Plan:  ESRD on dialysis ([place]) Duplex today shows some mildly elevated velocities at the arterial anastomosis but using a tapered graft that is not surprising.  The graft appears to be otherwise patent.  I think will be okay to start using his graft next week.  The start date can be 04/16/2020.  Once this is used and running okay, we can take out his catheter.  Plan to come back in 3 months with a duplex.      Leotis Pain 04/10/2020, 3:45 PM   This note was created with Dragon medical transcription system.  Any errors from dictation are unintentional.

## 2020-04-10 NOTE — Assessment & Plan Note (Signed)
Duplex today shows some mildly elevated velocities at the arterial anastomosis but using a tapered graft that is not surprising.  The graft appears to be otherwise patent.  I think will be okay to start using his graft next week.  The start date can be 04/16/2020.  Once this is used and running okay, we can take out his catheter.  Plan to come back in 3 months with a duplex.

## 2020-04-12 DIAGNOSIS — I129 Hypertensive chronic kidney disease with stage 1 through stage 4 chronic kidney disease, or unspecified chronic kidney disease: Secondary | ICD-10-CM | POA: Diagnosis not present

## 2020-04-12 DIAGNOSIS — E782 Mixed hyperlipidemia: Secondary | ICD-10-CM | POA: Diagnosis not present

## 2020-04-12 DIAGNOSIS — N183 Chronic kidney disease, stage 3 unspecified: Secondary | ICD-10-CM | POA: Diagnosis not present

## 2020-04-12 DIAGNOSIS — E1122 Type 2 diabetes mellitus with diabetic chronic kidney disease: Secondary | ICD-10-CM | POA: Diagnosis not present

## 2020-04-13 DIAGNOSIS — N186 End stage renal disease: Secondary | ICD-10-CM | POA: Diagnosis not present

## 2020-04-13 DIAGNOSIS — Z992 Dependence on renal dialysis: Secondary | ICD-10-CM | POA: Diagnosis not present

## 2020-04-16 DIAGNOSIS — Z992 Dependence on renal dialysis: Secondary | ICD-10-CM | POA: Diagnosis not present

## 2020-04-16 DIAGNOSIS — N186 End stage renal disease: Secondary | ICD-10-CM | POA: Diagnosis not present

## 2020-04-18 DIAGNOSIS — N186 End stage renal disease: Secondary | ICD-10-CM | POA: Diagnosis not present

## 2020-04-18 DIAGNOSIS — Z992 Dependence on renal dialysis: Secondary | ICD-10-CM | POA: Diagnosis not present

## 2020-04-20 DIAGNOSIS — Z992 Dependence on renal dialysis: Secondary | ICD-10-CM | POA: Diagnosis not present

## 2020-04-20 DIAGNOSIS — N186 End stage renal disease: Secondary | ICD-10-CM | POA: Diagnosis not present

## 2020-04-23 DIAGNOSIS — N186 End stage renal disease: Secondary | ICD-10-CM | POA: Diagnosis not present

## 2020-04-23 DIAGNOSIS — Z992 Dependence on renal dialysis: Secondary | ICD-10-CM | POA: Diagnosis not present

## 2020-04-25 DIAGNOSIS — N186 End stage renal disease: Secondary | ICD-10-CM | POA: Diagnosis not present

## 2020-04-25 DIAGNOSIS — Z992 Dependence on renal dialysis: Secondary | ICD-10-CM | POA: Diagnosis not present

## 2020-04-26 DIAGNOSIS — E1165 Type 2 diabetes mellitus with hyperglycemia: Secondary | ICD-10-CM | POA: Diagnosis not present

## 2020-04-26 DIAGNOSIS — K2900 Acute gastritis without bleeding: Secondary | ICD-10-CM | POA: Diagnosis not present

## 2020-04-26 DIAGNOSIS — E1122 Type 2 diabetes mellitus with diabetic chronic kidney disease: Secondary | ICD-10-CM | POA: Diagnosis not present

## 2020-04-26 DIAGNOSIS — F339 Major depressive disorder, recurrent, unspecified: Secondary | ICD-10-CM | POA: Diagnosis not present

## 2020-04-26 DIAGNOSIS — R112 Nausea with vomiting, unspecified: Secondary | ICD-10-CM | POA: Diagnosis not present

## 2020-04-26 DIAGNOSIS — I120 Hypertensive chronic kidney disease with stage 5 chronic kidney disease or end stage renal disease: Secondary | ICD-10-CM | POA: Diagnosis not present

## 2020-04-26 DIAGNOSIS — R52 Pain, unspecified: Secondary | ICD-10-CM | POA: Diagnosis not present

## 2020-04-26 DIAGNOSIS — R456 Violent behavior: Secondary | ICD-10-CM | POA: Diagnosis not present

## 2020-04-26 DIAGNOSIS — N186 End stage renal disease: Secondary | ICD-10-CM | POA: Diagnosis not present

## 2020-04-26 DIAGNOSIS — R404 Transient alteration of awareness: Secondary | ICD-10-CM | POA: Diagnosis not present

## 2020-04-26 DIAGNOSIS — E0865 Diabetes mellitus due to underlying condition with hyperglycemia: Secondary | ICD-10-CM | POA: Diagnosis not present

## 2020-04-26 DIAGNOSIS — K3184 Gastroparesis: Secondary | ICD-10-CM | POA: Diagnosis not present

## 2020-04-26 DIAGNOSIS — E1143 Type 2 diabetes mellitus with diabetic autonomic (poly)neuropathy: Secondary | ICD-10-CM | POA: Diagnosis not present

## 2020-04-26 DIAGNOSIS — N189 Chronic kidney disease, unspecified: Secondary | ICD-10-CM | POA: Diagnosis not present

## 2020-04-26 DIAGNOSIS — G9341 Metabolic encephalopathy: Secondary | ICD-10-CM | POA: Diagnosis not present

## 2020-04-26 DIAGNOSIS — I10 Essential (primary) hypertension: Secondary | ICD-10-CM | POA: Diagnosis not present

## 2020-04-26 DIAGNOSIS — R4182 Altered mental status, unspecified: Secondary | ICD-10-CM | POA: Diagnosis not present

## 2020-04-26 DIAGNOSIS — R069 Unspecified abnormalities of breathing: Secondary | ICD-10-CM | POA: Diagnosis not present

## 2020-04-27 DIAGNOSIS — R112 Nausea with vomiting, unspecified: Secondary | ICD-10-CM | POA: Diagnosis not present

## 2020-04-27 DIAGNOSIS — Z992 Dependence on renal dialysis: Secondary | ICD-10-CM | POA: Diagnosis not present

## 2020-04-27 DIAGNOSIS — I1311 Hypertensive heart and chronic kidney disease without heart failure, with stage 5 chronic kidney disease, or end stage renal disease: Secondary | ICD-10-CM | POA: Diagnosis not present

## 2020-04-27 DIAGNOSIS — G9341 Metabolic encephalopathy: Secondary | ICD-10-CM | POA: Diagnosis not present

## 2020-04-27 DIAGNOSIS — N186 End stage renal disease: Secondary | ICD-10-CM | POA: Diagnosis not present

## 2020-04-27 DIAGNOSIS — G9349 Other encephalopathy: Secondary | ICD-10-CM | POA: Diagnosis not present

## 2020-04-27 DIAGNOSIS — G40219 Localization-related (focal) (partial) symptomatic epilepsy and epileptic syndromes with complex partial seizures, intractable, without status epilepticus: Secondary | ICD-10-CM | POA: Diagnosis not present

## 2020-04-27 DIAGNOSIS — E1122 Type 2 diabetes mellitus with diabetic chronic kidney disease: Secondary | ICD-10-CM | POA: Diagnosis not present

## 2020-04-28 DIAGNOSIS — E1122 Type 2 diabetes mellitus with diabetic chronic kidney disease: Secondary | ICD-10-CM | POA: Diagnosis not present

## 2020-04-28 DIAGNOSIS — K3184 Gastroparesis: Secondary | ICD-10-CM | POA: Diagnosis not present

## 2020-04-28 DIAGNOSIS — I120 Hypertensive chronic kidney disease with stage 5 chronic kidney disease or end stage renal disease: Secondary | ICD-10-CM | POA: Diagnosis not present

## 2020-04-28 DIAGNOSIS — R112 Nausea with vomiting, unspecified: Secondary | ICD-10-CM | POA: Diagnosis not present

## 2020-04-28 DIAGNOSIS — R4182 Altered mental status, unspecified: Secondary | ICD-10-CM | POA: Diagnosis not present

## 2020-04-28 DIAGNOSIS — K2900 Acute gastritis without bleeding: Secondary | ICD-10-CM | POA: Diagnosis not present

## 2020-04-28 DIAGNOSIS — E1165 Type 2 diabetes mellitus with hyperglycemia: Secondary | ICD-10-CM | POA: Diagnosis not present

## 2020-04-28 DIAGNOSIS — I10 Essential (primary) hypertension: Secondary | ICD-10-CM | POA: Diagnosis not present

## 2020-04-28 DIAGNOSIS — Z992 Dependence on renal dialysis: Secondary | ICD-10-CM | POA: Diagnosis not present

## 2020-04-28 DIAGNOSIS — G9341 Metabolic encephalopathy: Secondary | ICD-10-CM | POA: Diagnosis not present

## 2020-04-28 DIAGNOSIS — N186 End stage renal disease: Secondary | ICD-10-CM | POA: Diagnosis not present

## 2020-04-28 DIAGNOSIS — F339 Major depressive disorder, recurrent, unspecified: Secondary | ICD-10-CM | POA: Diagnosis not present

## 2020-04-28 DIAGNOSIS — E1143 Type 2 diabetes mellitus with diabetic autonomic (poly)neuropathy: Secondary | ICD-10-CM | POA: Diagnosis not present

## 2020-04-29 DIAGNOSIS — Z992 Dependence on renal dialysis: Secondary | ICD-10-CM | POA: Diagnosis not present

## 2020-04-29 DIAGNOSIS — R4182 Altered mental status, unspecified: Secondary | ICD-10-CM | POA: Diagnosis not present

## 2020-04-29 DIAGNOSIS — G9341 Metabolic encephalopathy: Secondary | ICD-10-CM | POA: Diagnosis not present

## 2020-04-29 DIAGNOSIS — I10 Essential (primary) hypertension: Secondary | ICD-10-CM | POA: Diagnosis not present

## 2020-04-29 DIAGNOSIS — N186 End stage renal disease: Secondary | ICD-10-CM | POA: Diagnosis not present

## 2020-04-29 DIAGNOSIS — R112 Nausea with vomiting, unspecified: Secondary | ICD-10-CM | POA: Diagnosis not present

## 2020-04-30 DIAGNOSIS — G9341 Metabolic encephalopathy: Secondary | ICD-10-CM | POA: Diagnosis not present

## 2020-04-30 DIAGNOSIS — E1122 Type 2 diabetes mellitus with diabetic chronic kidney disease: Secondary | ICD-10-CM | POA: Diagnosis not present

## 2020-04-30 DIAGNOSIS — N186 End stage renal disease: Secondary | ICD-10-CM | POA: Diagnosis not present

## 2020-04-30 DIAGNOSIS — Z992 Dependence on renal dialysis: Secondary | ICD-10-CM | POA: Diagnosis not present

## 2020-04-30 DIAGNOSIS — I1311 Hypertensive heart and chronic kidney disease without heart failure, with stage 5 chronic kidney disease, or end stage renal disease: Secondary | ICD-10-CM | POA: Diagnosis not present

## 2020-04-30 DIAGNOSIS — R112 Nausea with vomiting, unspecified: Secondary | ICD-10-CM | POA: Diagnosis not present

## 2020-05-02 DIAGNOSIS — Z992 Dependence on renal dialysis: Secondary | ICD-10-CM | POA: Diagnosis not present

## 2020-05-02 DIAGNOSIS — N186 End stage renal disease: Secondary | ICD-10-CM | POA: Diagnosis not present

## 2020-05-03 DIAGNOSIS — R0789 Other chest pain: Secondary | ICD-10-CM | POA: Diagnosis not present

## 2020-05-03 DIAGNOSIS — R112 Nausea with vomiting, unspecified: Secondary | ICD-10-CM | POA: Diagnosis not present

## 2020-05-03 DIAGNOSIS — R1110 Vomiting, unspecified: Secondary | ICD-10-CM | POA: Diagnosis not present

## 2020-05-03 DIAGNOSIS — E1143 Type 2 diabetes mellitus with diabetic autonomic (poly)neuropathy: Secondary | ICD-10-CM | POA: Diagnosis not present

## 2020-05-03 DIAGNOSIS — R1084 Generalized abdominal pain: Secondary | ICD-10-CM | POA: Diagnosis not present

## 2020-05-03 DIAGNOSIS — I10 Essential (primary) hypertension: Secondary | ICD-10-CM | POA: Diagnosis not present

## 2020-05-03 DIAGNOSIS — K3184 Gastroparesis: Secondary | ICD-10-CM | POA: Diagnosis not present

## 2020-05-03 DIAGNOSIS — R079 Chest pain, unspecified: Secondary | ICD-10-CM | POA: Diagnosis not present

## 2020-05-03 DIAGNOSIS — J90 Pleural effusion, not elsewhere classified: Secondary | ICD-10-CM | POA: Diagnosis not present

## 2020-05-03 DIAGNOSIS — Z452 Encounter for adjustment and management of vascular access device: Secondary | ICD-10-CM | POA: Diagnosis not present

## 2020-05-04 DIAGNOSIS — Z992 Dependence on renal dialysis: Secondary | ICD-10-CM | POA: Diagnosis not present

## 2020-05-04 DIAGNOSIS — Z886 Allergy status to analgesic agent status: Secondary | ICD-10-CM | POA: Diagnosis not present

## 2020-05-04 DIAGNOSIS — M25521 Pain in right elbow: Secondary | ICD-10-CM | POA: Diagnosis not present

## 2020-05-04 DIAGNOSIS — E1122 Type 2 diabetes mellitus with diabetic chronic kidney disease: Secondary | ICD-10-CM | POA: Diagnosis not present

## 2020-05-04 DIAGNOSIS — R4182 Altered mental status, unspecified: Secondary | ICD-10-CM | POA: Diagnosis not present

## 2020-05-04 DIAGNOSIS — R404 Transient alteration of awareness: Secondary | ICD-10-CM | POA: Diagnosis not present

## 2020-05-04 DIAGNOSIS — I700 Atherosclerosis of aorta: Secondary | ICD-10-CM | POA: Diagnosis not present

## 2020-05-04 DIAGNOSIS — F251 Schizoaffective disorder, depressive type: Secondary | ICD-10-CM | POA: Diagnosis not present

## 2020-05-04 DIAGNOSIS — Z20822 Contact with and (suspected) exposure to covid-19: Secondary | ICD-10-CM | POA: Diagnosis not present

## 2020-05-04 DIAGNOSIS — R112 Nausea with vomiting, unspecified: Secondary | ICD-10-CM | POA: Diagnosis not present

## 2020-05-04 DIAGNOSIS — K4090 Unilateral inguinal hernia, without obstruction or gangrene, not specified as recurrent: Secondary | ICD-10-CM | POA: Diagnosis not present

## 2020-05-04 DIAGNOSIS — R1084 Generalized abdominal pain: Secondary | ICD-10-CM | POA: Diagnosis not present

## 2020-05-04 DIAGNOSIS — E1165 Type 2 diabetes mellitus with hyperglycemia: Secondary | ICD-10-CM | POA: Diagnosis not present

## 2020-05-04 DIAGNOSIS — N186 End stage renal disease: Secondary | ICD-10-CM | POA: Diagnosis not present

## 2020-05-04 DIAGNOSIS — R45851 Suicidal ideations: Secondary | ICD-10-CM | POA: Diagnosis not present

## 2020-05-04 DIAGNOSIS — I120 Hypertensive chronic kidney disease with stage 5 chronic kidney disease or end stage renal disease: Secondary | ICD-10-CM | POA: Diagnosis not present

## 2020-05-04 DIAGNOSIS — R52 Pain, unspecified: Secondary | ICD-10-CM | POA: Diagnosis not present

## 2020-05-05 DIAGNOSIS — M25521 Pain in right elbow: Secondary | ICD-10-CM | POA: Diagnosis not present

## 2020-05-07 DIAGNOSIS — R451 Restlessness and agitation: Secondary | ICD-10-CM | POA: Diagnosis not present

## 2020-05-07 DIAGNOSIS — Z992 Dependence on renal dialysis: Secondary | ICD-10-CM | POA: Diagnosis not present

## 2020-05-07 DIAGNOSIS — E119 Type 2 diabetes mellitus without complications: Secondary | ICD-10-CM | POA: Diagnosis not present

## 2020-05-07 DIAGNOSIS — E785 Hyperlipidemia, unspecified: Secondary | ICD-10-CM | POA: Diagnosis not present

## 2020-05-07 DIAGNOSIS — I129 Hypertensive chronic kidney disease with stage 1 through stage 4 chronic kidney disease, or unspecified chronic kidney disease: Secondary | ICD-10-CM | POA: Diagnosis not present

## 2020-05-07 DIAGNOSIS — I10 Essential (primary) hypertension: Secondary | ICD-10-CM | POA: Diagnosis not present

## 2020-05-07 DIAGNOSIS — N186 End stage renal disease: Secondary | ICD-10-CM | POA: Diagnosis not present

## 2020-05-07 DIAGNOSIS — H401190 Primary open-angle glaucoma, unspecified eye, stage unspecified: Secondary | ICD-10-CM | POA: Diagnosis not present

## 2020-05-07 DIAGNOSIS — F419 Anxiety disorder, unspecified: Secondary | ICD-10-CM | POA: Diagnosis not present

## 2020-05-09 DIAGNOSIS — N186 End stage renal disease: Secondary | ICD-10-CM | POA: Diagnosis not present

## 2020-05-09 DIAGNOSIS — Z992 Dependence on renal dialysis: Secondary | ICD-10-CM | POA: Diagnosis not present

## 2020-05-10 DIAGNOSIS — Z992 Dependence on renal dialysis: Secondary | ICD-10-CM | POA: Diagnosis not present

## 2020-05-10 DIAGNOSIS — N186 End stage renal disease: Secondary | ICD-10-CM | POA: Diagnosis not present

## 2020-05-11 DIAGNOSIS — N186 End stage renal disease: Secondary | ICD-10-CM | POA: Diagnosis not present

## 2020-05-11 DIAGNOSIS — Z992 Dependence on renal dialysis: Secondary | ICD-10-CM | POA: Diagnosis not present

## 2020-05-13 DIAGNOSIS — N3000 Acute cystitis without hematuria: Secondary | ICD-10-CM | POA: Diagnosis not present

## 2020-05-13 DIAGNOSIS — R52 Pain, unspecified: Secondary | ICD-10-CM | POA: Diagnosis not present

## 2020-05-13 DIAGNOSIS — I129 Hypertensive chronic kidney disease with stage 1 through stage 4 chronic kidney disease, or unspecified chronic kidney disease: Secondary | ICD-10-CM | POA: Diagnosis not present

## 2020-05-13 DIAGNOSIS — R569 Unspecified convulsions: Secondary | ICD-10-CM | POA: Diagnosis not present

## 2020-05-13 DIAGNOSIS — N390 Urinary tract infection, site not specified: Secondary | ICD-10-CM | POA: Diagnosis not present

## 2020-05-13 DIAGNOSIS — E1122 Type 2 diabetes mellitus with diabetic chronic kidney disease: Secondary | ICD-10-CM | POA: Diagnosis not present

## 2020-05-13 DIAGNOSIS — R410 Disorientation, unspecified: Secondary | ICD-10-CM | POA: Diagnosis not present

## 2020-05-13 DIAGNOSIS — R404 Transient alteration of awareness: Secondary | ICD-10-CM | POA: Diagnosis not present

## 2020-05-13 DIAGNOSIS — R456 Violent behavior: Secondary | ICD-10-CM | POA: Diagnosis not present

## 2020-05-13 DIAGNOSIS — N183 Chronic kidney disease, stage 3 unspecified: Secondary | ICD-10-CM | POA: Diagnosis not present

## 2020-05-13 DIAGNOSIS — E1121 Type 2 diabetes mellitus with diabetic nephropathy: Secondary | ICD-10-CM | POA: Diagnosis not present

## 2020-05-13 DIAGNOSIS — G9341 Metabolic encephalopathy: Secondary | ICD-10-CM | POA: Diagnosis not present

## 2020-05-13 DIAGNOSIS — I120 Hypertensive chronic kidney disease with stage 5 chronic kidney disease or end stage renal disease: Secondary | ICD-10-CM | POA: Diagnosis not present

## 2020-05-13 DIAGNOSIS — R1084 Generalized abdominal pain: Secondary | ICD-10-CM | POA: Diagnosis not present

## 2020-05-13 DIAGNOSIS — G9349 Other encephalopathy: Secondary | ICD-10-CM | POA: Diagnosis not present

## 2020-05-13 DIAGNOSIS — F05 Delirium due to known physiological condition: Secondary | ICD-10-CM | POA: Diagnosis not present

## 2020-05-13 DIAGNOSIS — E782 Mixed hyperlipidemia: Secondary | ICD-10-CM | POA: Diagnosis not present

## 2020-05-13 DIAGNOSIS — R451 Restlessness and agitation: Secondary | ICD-10-CM | POA: Diagnosis not present

## 2020-05-13 DIAGNOSIS — E1165 Type 2 diabetes mellitus with hyperglycemia: Secondary | ICD-10-CM | POA: Diagnosis not present

## 2020-05-13 DIAGNOSIS — Z992 Dependence on renal dialysis: Secondary | ICD-10-CM | POA: Diagnosis not present

## 2020-05-13 DIAGNOSIS — E1143 Type 2 diabetes mellitus with diabetic autonomic (poly)neuropathy: Secondary | ICD-10-CM | POA: Diagnosis not present

## 2020-05-13 DIAGNOSIS — K3184 Gastroparesis: Secondary | ICD-10-CM | POA: Diagnosis not present

## 2020-05-13 DIAGNOSIS — I1311 Hypertensive heart and chronic kidney disease without heart failure, with stage 5 chronic kidney disease, or end stage renal disease: Secondary | ICD-10-CM | POA: Diagnosis not present

## 2020-05-13 DIAGNOSIS — N186 End stage renal disease: Secondary | ICD-10-CM | POA: Diagnosis not present

## 2020-05-14 DIAGNOSIS — Z992 Dependence on renal dialysis: Secondary | ICD-10-CM | POA: Diagnosis not present

## 2020-05-14 DIAGNOSIS — E1165 Type 2 diabetes mellitus with hyperglycemia: Secondary | ICD-10-CM | POA: Diagnosis not present

## 2020-05-14 DIAGNOSIS — F05 Delirium due to known physiological condition: Secondary | ICD-10-CM | POA: Diagnosis not present

## 2020-05-14 DIAGNOSIS — N186 End stage renal disease: Secondary | ICD-10-CM | POA: Diagnosis not present

## 2020-05-14 DIAGNOSIS — E1121 Type 2 diabetes mellitus with diabetic nephropathy: Secondary | ICD-10-CM | POA: Diagnosis not present

## 2020-05-14 DIAGNOSIS — G9349 Other encephalopathy: Secondary | ICD-10-CM | POA: Diagnosis not present

## 2020-05-14 DIAGNOSIS — I1311 Hypertensive heart and chronic kidney disease without heart failure, with stage 5 chronic kidney disease, or end stage renal disease: Secondary | ICD-10-CM | POA: Diagnosis not present

## 2020-05-15 DIAGNOSIS — E782 Mixed hyperlipidemia: Secondary | ICD-10-CM | POA: Diagnosis not present

## 2020-05-15 DIAGNOSIS — E1121 Type 2 diabetes mellitus with diabetic nephropathy: Secondary | ICD-10-CM | POA: Diagnosis not present

## 2020-05-15 DIAGNOSIS — Z992 Dependence on renal dialysis: Secondary | ICD-10-CM | POA: Diagnosis not present

## 2020-05-15 DIAGNOSIS — N186 End stage renal disease: Secondary | ICD-10-CM | POA: Diagnosis not present

## 2020-05-15 DIAGNOSIS — F05 Delirium due to known physiological condition: Secondary | ICD-10-CM | POA: Diagnosis not present

## 2020-05-15 DIAGNOSIS — E1165 Type 2 diabetes mellitus with hyperglycemia: Secondary | ICD-10-CM | POA: Diagnosis not present

## 2020-05-15 DIAGNOSIS — N183 Chronic kidney disease, stage 3 unspecified: Secondary | ICD-10-CM | POA: Diagnosis not present

## 2020-05-15 DIAGNOSIS — I1311 Hypertensive heart and chronic kidney disease without heart failure, with stage 5 chronic kidney disease, or end stage renal disease: Secondary | ICD-10-CM | POA: Diagnosis not present

## 2020-05-15 DIAGNOSIS — I129 Hypertensive chronic kidney disease with stage 1 through stage 4 chronic kidney disease, or unspecified chronic kidney disease: Secondary | ICD-10-CM | POA: Diagnosis not present

## 2020-05-15 DIAGNOSIS — G9349 Other encephalopathy: Secondary | ICD-10-CM | POA: Diagnosis not present

## 2020-05-15 DIAGNOSIS — E1122 Type 2 diabetes mellitus with diabetic chronic kidney disease: Secondary | ICD-10-CM | POA: Diagnosis not present

## 2020-05-16 DIAGNOSIS — E1121 Type 2 diabetes mellitus with diabetic nephropathy: Secondary | ICD-10-CM | POA: Diagnosis not present

## 2020-05-16 DIAGNOSIS — N186 End stage renal disease: Secondary | ICD-10-CM | POA: Diagnosis not present

## 2020-05-16 DIAGNOSIS — I1311 Hypertensive heart and chronic kidney disease without heart failure, with stage 5 chronic kidney disease, or end stage renal disease: Secondary | ICD-10-CM | POA: Diagnosis not present

## 2020-05-16 DIAGNOSIS — Z992 Dependence on renal dialysis: Secondary | ICD-10-CM | POA: Diagnosis not present

## 2020-05-16 DIAGNOSIS — E1165 Type 2 diabetes mellitus with hyperglycemia: Secondary | ICD-10-CM | POA: Diagnosis not present

## 2020-05-16 DIAGNOSIS — F05 Delirium due to known physiological condition: Secondary | ICD-10-CM | POA: Diagnosis not present

## 2020-05-16 DIAGNOSIS — G9349 Other encephalopathy: Secondary | ICD-10-CM | POA: Diagnosis not present

## 2020-05-16 NOTE — Progress Notes (Deleted)
Referring Provider: [name], MD Primary Care Physician:  [name], MD Primary GI Physician: Dr. Gala Romney  No chief complaint on file.   HPI:   Timothy Clark is a 49 y.o. male presenting today for follow-up.  History of abdominal pain, GERD, esophagitis, gastritis, suspected gastroparesis, recurrent nausea and vomiting, H. pylori s/p eradication in 2016, type 2 diabetes, diastolic dysfunction, end-stage renal disease on hemodialysis.   GES on 06/15/2015 with rapid gastric transit. [facility] question dumping syndrome, but EGD at [facility] January 2017 with mild esophagitis and old gastric contents. Felt clinically consistent with gastroparesis. RUQ ultrasound March 2019 normal. CT November 2019 with fatty liver, gallbladder sludge. HIDA scan normal in June 2020. No prior colonoscopy.   He was last seen in our office 12/08/2019.  Unfortunately, he had received the Covid vaccine the day prior to his office visit and was feeling poorly with headache, nausea, and ended up having an episode of vomiting during our office visit.  Reported prior to Covid vaccine, he has been doing pretty well.  Denied nausea, vomiting, abdominal pain, or reflux symptoms with Reglan once daily and Protonix twice daily.  He was also taking Carafate.  Had not needed Phenergan.  Blood sugars had been running high and was admitted in March for DKA.  Insulin was increased and blood sugars seem to be under better control with A1c down to 8.  Also reported he woke up Monday morning with cloudy vision in his left eye with plans for eye appointment later that day.  As he had been doing well prior to Covid vaccine, advised that he continue Reglan daily, Protonix twice daily, Phenergan as needed, and follow-up in 6 months.  Advised he could discontinue Carafate.  Patient was seen at [facility] 05/04/2020 for abdominal pain, nausea and vomiting.  He had been seen on 9/23 and had CT A/P with no acute findings.  His glucose  was elevated greater than 250.  His lipase was elevated at 176.  LFTs within normal limits.  He left AGAINST MEDICAL ADVICE.  He went home and had more pain to return to the emergency room.  Reported infection in his left superior dialysis port and had been getting antibiotics with dialysis.  He missed dialysis on 9/24.  Patient sister called and stated patient was suicidal and had threatened to kill himself, his dad, and his sister.  He was IVC.  He was combative and given Geodon IM.  He had telepsych visit and telepsychiatrist recommended IVC reversal as he did not have evidence of homicidal or suicidal ideation and no psychotic features.  He was offered a transfer to [facility] but ultimately declined and was ready to go home.  Today:     Past Medical History:  Diagnosis Date  . Anemia   . Anxiety   . CHF (congestive heart failure) (HCC)    diastolic dysfunction  . Chronic abdominal pain   . COVID-19 07/2019  . Depression   . Esophagitis   . ESRD on hemodialysis ([place])   . Eye hemorrhage   . Gastritis   . Gastroparesis   . GERD (gastroesophageal reflux disease)   . Glaucoma   . Helicobacter pylori (H. pylori) infection 2016   ERADICATION DOCUMENTED VIA STOOL TEST in AUG 2016 at [place]  . Hiccups   . Hypertension   . Nausea and vomiting    chronic, recurrent  . Neuropathy    feet  . Renal insufficiency   .  Type 2 diabetes mellitus with complications (HCC)    diagnosed around age 36    Past Surgical History:  Procedure Laterality Date  . AMPUTATION TOE Right    great toe  . AV FISTULA INSERTION W/ RF MAGNETIC GUIDANCE Left 04/06/2019   Procedure: AV FISTULA INSERTION W/RF MAGNETIC GUIDANCE;  Surgeon: Katha Cabal, MD;  Location: [place] [facility];  Service: Cardiovascular;  Laterality: Left;  . AV FISTULA PLACEMENT Left 05/06/2019   Procedure: INSERTION OF ARTERIOVENOUS (AV) GORE-TEX GRAFT ARM ( FOREARM LOOP );  Surgeon: Katha Cabal, MD;  Location: [facility];   Service: Vascular;  Laterality: Left;  . AV FISTULA PLACEMENT Left 03/21/2020   Procedure: INSERTION OF ARTERIOVENOUS (AV) GORE-TEX GRAFT ARM ( BRACHIAL AXILLARY );  Surgeon: Katha Cabal, MD;  Location: [facility];  Service: Vascular;  Laterality: Left;  . CATARACT EXTRACTION W/PHACO Left 05/19/2016   Procedure: CATARACT EXTRACTION PHACO AND INTRAOCULAR LENS PLACEMENT LEFT EYE;  Surgeon: Tonny Branch, MD;  Location: [facility];  Service: Ophthalmology;  Laterality: Left;  CDE: 7.30  . CATARACT EXTRACTION W/PHACO Right 06/23/2016   Procedure: CATARACT EXTRACTION PHACO AND INTRAOCULAR LENS PLACEMENT RIGHT EYE CDE=9.87;  Surgeon: Tonny Branch, MD;  Location: [facility];  Service: Ophthalmology;  Laterality: Right;  right  . DIALYSIS/PERMA CATHETER REMOVAL N/A 07/19/2019   Procedure: DIALYSIS/PERMA CATHETER REMOVAL;  Surgeon: Katha Cabal, MD;  Location: [place] [facility];  Service: Cardiovascular;  Laterality: N/A;  . ESOPHAGOGASTRODUODENOSCOPY  2015   Dr. Britta Mccreedy  . ESOPHAGOGASTRODUODENOSCOPY N/A 10/26/2014   RMR: Distal esophagititis-likely reflux related although an element of pill induced injuruy no exclueded  status post biopsy. Diffusely abnormal gastric mucosa of uncertain signigicane -status post gastric biopsy. Focal area of excoriation in the cardia most consistant with a trauma of heaving.   . ESOPHAGOGASTRODUODENOSCOPY  08/2015   [facility]: mild esophagitis and old gastric contents  . EYE SURGERY Left 2015   stent placed to left eye  . EYE SURGERY Right 06/2019   per patient- to remove scar tissue  . INCISION AND DRAINAGE OF WOUND Right 07/16/2017   Procedure: IRRIGATION AND DEBRIDEMENT OF SOFT TISSUE OF ULCERATION RIGHT FOOT;  Surgeon: Caprice Beaver, DPM;  Location: [facility];  Service: Podiatry;  Laterality: Right;  . PERIPHERAL VASCULAR THROMBECTOMY Left 09/27/2019   Procedure: PERIPHERAL VASCULAR THROMBECTOMY;  Surgeon: Katha Cabal, MD;  Location: [place] [facility];  Service:  Cardiovascular;  Laterality: Left;  . PERIPHERAL VASCULAR THROMBECTOMY Left 10/27/2019   Procedure: PERIPHERAL VASCULAR THROMBECTOMY;  Surgeon: Algernon Huxley, MD;  Location: [place] [facility];  Service: Cardiovascular;  Laterality: Left;  . PERIPHERAL VASCULAR THROMBECTOMY Left 12/13/2019   Procedure: PERIPHERAL VASCULAR THROMBECTOMY;  Surgeon: Katha Cabal, MD;  Location: [place] [facility];  Service: Cardiovascular;  Laterality: Left;    Current Outpatient Medications  Medication Sig Dispense Refill  . ALPRAZolam (XANAX) 0.5 MG tablet Take 0.5 mg by mouth 2 (two) times daily as needed for anxiety.     [name] amLODipine (NORVASC) 5 MG tablet Take 5 mg by mouth daily.    [name] atorvastatin (LIPITOR) 10 MG tablet Take 10 mg by mouth at bedtime.     . brimonidine (ALPHAGAN) 0.15 % ophthalmic solution Place 1 drop into both eyes in the morning and at bedtime.     . carvedilol (COREG) 25 MG tablet Take 25 mg by mouth 2 (two) times daily with a meal.    . cloNIDine (CATAPRES) 0.2 MG  tablet Take 0.2 mg by mouth 2 (two) times daily.     . cyanocobalamin (,VITAMIN B-12,) 1000 MCG/ML injection Inject 1,000 mcg into the muscle every 30 (thirty) days.   1  . dicyclomine (BENTYL) 10 MG capsule Take 1 capsule (10 mg total) by mouth every 6 (six) hours as needed (abd pain and spasm.). 60 capsule 0  . dorzolamide-timolol (COSOPT) 22.3-6.8 MG/ML ophthalmic solution Place 1 drop into both eyes 2 (two) times daily.     . folic acid (FOLVITE) 1 MG tablet Take 1 mg by mouth daily.    . metoCLOPramide (REGLAN) 5 MG tablet Take 1 tablet (5 mg total) by mouth every 12 (twelve) hours. 60 tablet 1  . NOVOLOG MIX 70/30 FLEXPEN (70-30) 100 UNIT/ML FlexPen 35-55 Units by Subconjunctival route See admin instructions. Inject 55 units subcutaneously in the morning & inject 35 units subcutaneously in the evening.    [name] oxyCODONE-acetaminophen (PERCOCET) 5-325 MG tablet Take 1-2 tablets by mouth every 6 (six) hours as needed for  moderate pain or severe pain. 36 tablet 0  . pantoprazole (PROTONIX) 40 MG tablet Take 1 tablet (40 mg total) by mouth 2 (two) times daily before a meal. 180 tablet 3  . sertraline (ZOLOFT) 100 MG tablet Take 100 mg by mouth daily.    . sitaGLIPtin (JANUVIA) 25 MG tablet Take 25 mg by mouth daily.     . sucralfate (CARAFATE) 1 g tablet Take 1 g by mouth in the morning, at noon, and at bedtime.     . torsemide (DEMADEX) 20 MG tablet Take 40 mg by mouth daily.     No current facility-administered medications for this visit.    Allergies as of 05/17/2020 - Review Complete 04/10/2020  Allergen Reaction Noted  . Donnatal [phenobarbital-belladonna alk] Anaphylaxis and Other (See Comments) 07/12/2015  . Fish allergy Anaphylaxis, Hives, and Rash 01/14/2015  . Haldol [haloperidol lactate] Other (See Comments) 08/07/2015  . Maalox [calcium carbonate antacid] Anaphylaxis and Other (See Comments) 07/12/2015  . Shellfish allergy Hives 09/27/2019  . Aspirin Hives and Itching 03/22/2009  . Doxycycline Hives, Nausea And Vomiting, and Other (See Comments) 03/20/2014  . Keflex [cephalexin] Hives, Itching, and Nausea And Vomiting 06/22/2017  . Marinol [dronabinol] Nausea And Vomiting and Other (See Comments) 05/28/2018  . Other Swelling 03/13/2020  . Penicillins Hives, Itching, Other (See Comments), and Rash 03/22/2009  . Bactrim [sulfamethoxazole-trimethoprim] Itching 12/06/2014    Family History  Problem Relation Age of Onset  . Ovarian cancer Mother   . Heart attack Father   . Colon polyps Father   . Cervical cancer Sister   . Diabetes Sister   . Colon cancer Neg Hx     Social History   Socioeconomic History  . Marital status: Single    Spouse name: Not on file  . Number of children: Not on file  . Years of education: Not on file  . Highest education level: Not on file  Occupational History  . Not on file  Tobacco Use  . Smoking status: Never Smoker  . Smokeless tobacco: Never Used    Vaping Use  . Vaping Use: Never used  Substance and Sexual Activity  . Alcohol use: No    Alcohol/week: 0.0 standard drinks  . Drug use: No  . Sexual activity: Yes  Other Topics Concern  . Not on file  Social History Narrative   Lives with dad and sister   Social Determinants of Health   Financial Resource Strain:   .  Difficulty of Paying Living Expenses: Not on file  Food Insecurity:   . Worried About Charity fundraiser in the Last Year: Not on file  . Ran Out of Food in the Last Year: Not on file  Transportation Needs:   . Lack of Transportation (Medical): Not on file  . Lack of Transportation (Non-Medical): Not on file  Physical Activity:   . Days of Exercise per Week: Not on file  . Minutes of Exercise per Session: Not on file  Stress:   . Feeling of Stress : Not on file  Social Connections:   . Frequency of Communication with Friends and Family: Not on file  . Frequency of Social Gatherings with Friends and Family: Not on file  . Attends Religious Services: Not on file  . Active Member of Clubs or Organizations: Not on file  . Attends Archivist Meetings: Not on file  . Marital Status: Not on file    Review of Systems: Gen: Denies fever, chills, anorexia. Denies fatigue, weakness, weight loss.  CV: Denies chest pain, palpitations, syncope, peripheral edema, and claudication. Resp: Denies dyspnea at rest, cough, wheezing, coughing up blood, and pleurisy. GI: Denies vomiting blood, jaundice, and fecal incontinence.   Denies dysphagia or odynophagia. Derm: Denies rash, itching, dry skin Psych: Denies depression, anxiety, memory loss, confusion. No homicidal or suicidal ideation.  Heme: Denies bruising, bleeding, and enlarged lymph nodes.  Physical Exam: There were no vitals taken for this visit. General:   Alert and oriented. No distress noted. Pleasant and cooperative.  Head:  Normocephalic and atraumatic. Eyes:  Conjuctiva clear without scleral  icterus. Mouth:  Oral mucosa pink and moist. Good dentition. No lesions. Heart:  S1, S2 present without murmurs appreciated. Lungs:  Clear to auscultation bilaterally. No wheezes, rales, or rhonchi. No distress.  Abdomen:  +BS, soft, non-tender and non-distended. No rebound or guarding. No HSM or masses noted. Msk:  Symmetrical without gross deformities. Normal posture. Extremities:  Without edema. Neurologic:  Alert and  oriented x4 Psych:  Alert and cooperative. Normal mood and affect.

## 2020-05-17 ENCOUNTER — Ambulatory Visit: Payer: Medicare HMO | Admitting: Gastroenterology

## 2020-05-17 ENCOUNTER — Encounter: Payer: Self-pay | Admitting: Internal Medicine

## 2020-05-17 DIAGNOSIS — E1121 Type 2 diabetes mellitus with diabetic nephropathy: Secondary | ICD-10-CM | POA: Diagnosis not present

## 2020-05-17 DIAGNOSIS — N186 End stage renal disease: Secondary | ICD-10-CM | POA: Diagnosis not present

## 2020-05-17 DIAGNOSIS — I1311 Hypertensive heart and chronic kidney disease without heart failure, with stage 5 chronic kidney disease, or end stage renal disease: Secondary | ICD-10-CM | POA: Diagnosis not present

## 2020-05-17 DIAGNOSIS — Z992 Dependence on renal dialysis: Secondary | ICD-10-CM | POA: Diagnosis not present

## 2020-05-17 DIAGNOSIS — F05 Delirium due to known physiological condition: Secondary | ICD-10-CM | POA: Diagnosis not present

## 2020-05-17 DIAGNOSIS — E1165 Type 2 diabetes mellitus with hyperglycemia: Secondary | ICD-10-CM | POA: Diagnosis not present

## 2020-05-17 DIAGNOSIS — G9349 Other encephalopathy: Secondary | ICD-10-CM | POA: Diagnosis not present

## 2020-05-18 DIAGNOSIS — Z992 Dependence on renal dialysis: Secondary | ICD-10-CM | POA: Diagnosis not present

## 2020-05-18 DIAGNOSIS — N186 End stage renal disease: Secondary | ICD-10-CM | POA: Diagnosis not present

## 2020-05-19 DIAGNOSIS — H548 Legal blindness, as defined in USA: Secondary | ICD-10-CM | POA: Diagnosis not present

## 2020-05-19 DIAGNOSIS — G9341 Metabolic encephalopathy: Secondary | ICD-10-CM | POA: Diagnosis not present

## 2020-05-19 DIAGNOSIS — E1143 Type 2 diabetes mellitus with diabetic autonomic (poly)neuropathy: Secondary | ICD-10-CM | POA: Diagnosis not present

## 2020-05-19 DIAGNOSIS — I120 Hypertensive chronic kidney disease with stage 5 chronic kidney disease or end stage renal disease: Secondary | ICD-10-CM | POA: Diagnosis not present

## 2020-05-19 DIAGNOSIS — N3000 Acute cystitis without hematuria: Secondary | ICD-10-CM | POA: Diagnosis not present

## 2020-05-19 DIAGNOSIS — E1151 Type 2 diabetes mellitus with diabetic peripheral angiopathy without gangrene: Secondary | ICD-10-CM | POA: Diagnosis not present

## 2020-05-19 DIAGNOSIS — N186 End stage renal disease: Secondary | ICD-10-CM | POA: Diagnosis not present

## 2020-05-19 DIAGNOSIS — E1122 Type 2 diabetes mellitus with diabetic chronic kidney disease: Secondary | ICD-10-CM | POA: Diagnosis not present

## 2020-05-19 DIAGNOSIS — K219 Gastro-esophageal reflux disease without esophagitis: Secondary | ICD-10-CM | POA: Diagnosis not present

## 2020-05-21 DIAGNOSIS — Z992 Dependence on renal dialysis: Secondary | ICD-10-CM | POA: Diagnosis not present

## 2020-05-21 DIAGNOSIS — N186 End stage renal disease: Secondary | ICD-10-CM | POA: Diagnosis not present

## 2020-05-22 DIAGNOSIS — N186 End stage renal disease: Secondary | ICD-10-CM | POA: Diagnosis not present

## 2020-05-22 DIAGNOSIS — I120 Hypertensive chronic kidney disease with stage 5 chronic kidney disease or end stage renal disease: Secondary | ICD-10-CM | POA: Diagnosis not present

## 2020-05-22 DIAGNOSIS — N3000 Acute cystitis without hematuria: Secondary | ICD-10-CM | POA: Diagnosis not present

## 2020-05-22 DIAGNOSIS — G9341 Metabolic encephalopathy: Secondary | ICD-10-CM | POA: Diagnosis not present

## 2020-05-22 DIAGNOSIS — E1122 Type 2 diabetes mellitus with diabetic chronic kidney disease: Secondary | ICD-10-CM | POA: Diagnosis not present

## 2020-05-22 DIAGNOSIS — E1143 Type 2 diabetes mellitus with diabetic autonomic (poly)neuropathy: Secondary | ICD-10-CM | POA: Diagnosis not present

## 2020-05-23 DIAGNOSIS — Z992 Dependence on renal dialysis: Secondary | ICD-10-CM | POA: Diagnosis not present

## 2020-05-23 DIAGNOSIS — F05 Delirium due to known physiological condition: Secondary | ICD-10-CM | POA: Diagnosis not present

## 2020-05-23 DIAGNOSIS — N186 End stage renal disease: Secondary | ICD-10-CM | POA: Diagnosis not present

## 2020-05-23 DIAGNOSIS — N390 Urinary tract infection, site not specified: Secondary | ICD-10-CM | POA: Diagnosis not present

## 2020-05-25 DIAGNOSIS — N186 End stage renal disease: Secondary | ICD-10-CM | POA: Diagnosis not present

## 2020-05-25 DIAGNOSIS — Z992 Dependence on renal dialysis: Secondary | ICD-10-CM | POA: Diagnosis not present

## 2020-05-28 DIAGNOSIS — N186 End stage renal disease: Secondary | ICD-10-CM | POA: Diagnosis not present

## 2020-05-28 DIAGNOSIS — Z992 Dependence on renal dialysis: Secondary | ICD-10-CM | POA: Diagnosis not present

## 2020-05-29 DIAGNOSIS — Z0001 Encounter for general adult medical examination with abnormal findings: Secondary | ICD-10-CM | POA: Diagnosis not present

## 2020-05-29 DIAGNOSIS — I129 Hypertensive chronic kidney disease with stage 1 through stage 4 chronic kidney disease, or unspecified chronic kidney disease: Secondary | ICD-10-CM | POA: Diagnosis not present

## 2020-05-29 DIAGNOSIS — E1122 Type 2 diabetes mellitus with diabetic chronic kidney disease: Secondary | ICD-10-CM | POA: Diagnosis not present

## 2020-05-29 DIAGNOSIS — I10 Essential (primary) hypertension: Secondary | ICD-10-CM | POA: Diagnosis not present

## 2020-05-29 DIAGNOSIS — F419 Anxiety disorder, unspecified: Secondary | ICD-10-CM | POA: Diagnosis not present

## 2020-05-29 DIAGNOSIS — E7849 Other hyperlipidemia: Secondary | ICD-10-CM | POA: Diagnosis not present

## 2020-05-29 DIAGNOSIS — R451 Restlessness and agitation: Secondary | ICD-10-CM | POA: Diagnosis not present

## 2020-05-29 DIAGNOSIS — D509 Iron deficiency anemia, unspecified: Secondary | ICD-10-CM | POA: Diagnosis not present

## 2020-05-30 ENCOUNTER — Ambulatory Visit (INDEPENDENT_AMBULATORY_CARE_PROVIDER_SITE_OTHER): Payer: Medicare HMO

## 2020-05-30 ENCOUNTER — Other Ambulatory Visit: Payer: Self-pay

## 2020-05-30 ENCOUNTER — Ambulatory Visit (INDEPENDENT_AMBULATORY_CARE_PROVIDER_SITE_OTHER): Payer: Medicare HMO | Admitting: Podiatry

## 2020-05-30 DIAGNOSIS — E0843 Diabetes mellitus due to underlying condition with diabetic autonomic (poly)neuropathy: Secondary | ICD-10-CM

## 2020-05-30 DIAGNOSIS — B351 Tinea unguium: Secondary | ICD-10-CM

## 2020-05-30 DIAGNOSIS — M79675 Pain in left toe(s): Secondary | ICD-10-CM | POA: Diagnosis not present

## 2020-05-30 DIAGNOSIS — N186 End stage renal disease: Secondary | ICD-10-CM | POA: Diagnosis not present

## 2020-05-30 DIAGNOSIS — L97522 Non-pressure chronic ulcer of other part of left foot with fat layer exposed: Secondary | ICD-10-CM

## 2020-05-30 DIAGNOSIS — R69 Illness, unspecified: Secondary | ICD-10-CM | POA: Diagnosis not present

## 2020-05-30 DIAGNOSIS — M79674 Pain in right toe(s): Secondary | ICD-10-CM | POA: Diagnosis not present

## 2020-05-30 DIAGNOSIS — Z992 Dependence on renal dialysis: Secondary | ICD-10-CM | POA: Diagnosis not present

## 2020-05-30 NOTE — Progress Notes (Addendum)
Subjective:  49 y.o. male with PMHx of diabetes mellitus, ESRD, presenting for evaluation of an ulcer that is developed to the left hallux.  Ulcer has been present for several months now.  Patient states that he already has home health dressing change nurses coming to the house to change the dressings 1 times weekly.  He is also requesting a nail trim today.  Patient is unable to trim his own nails.  They are thickened and he is concerned since he is diabetic and he has history of amputation already.  He presents for further treatment and evaluation   Past Medical History:  Diagnosis Date  . Anemia   . Anxiety   . CHF (congestive heart failure) (HCC)    diastolic dysfunction  . Chronic abdominal pain   . COVID-19 07/2019  . Depression   . Esophagitis   . ESRD on hemodialysis ([place])   . Eye hemorrhage   . Gastritis   . Gastroparesis   . GERD (gastroesophageal reflux disease)   . Glaucoma   . Helicobacter pylori (H. pylori) infection 2016   ERADICATION DOCUMENTED VIA STOOL TEST in AUG 2016 at [place]  . Hiccups   . Hypertension   . Nausea and vomiting    chronic, recurrent  . Neuropathy    feet  . Renal insufficiency   . Type 2 diabetes mellitus with complications (HCC)    diagnosed around age 56        Objective/Physical Exam General: The patient is alert and oriented x3 in no acute distress.  Dermatology:  Wound #1 noted to the left hallux measuring approximately 2.5 x 2.0 x 0.2 cm (LxWxD).   Wound #2 is noted to the left subfifth MTPJ measuring approximately 1.0 x 1.0 x 0.2 cm.  To the noted ulceration(s), there is no eschar. There is a moderate amount of slough, fibrin, and necrotic tissue noted. Granulation tissue and wound base is red. There is a minimal amount of serosanguineous drainage noted. There is no exposed bone muscle-tendon ligament or joint. There is no malodor. Periwound integrity is intact. Skin is warm, dry and supple bilateral lower  extremities.  Thickened, hyperkeratotic, dystrophic, elongated nails also noted bilateral  Vascular: Palpable pedal pulses bilaterally. No edema or erythema noted. Capillary refill within normal limits.  Neurological: Epicritic and protective threshold diminished bilaterally.   Musculoskeletal Exam: History of partial first ray amputation right foot  Radiographic exam: No osteolytic erosion or periosteal reactions noted.  No evidence of osteomyelitis.  Cortices appear intact.  Joint spaces preserved.  Os they sicles noted to the fifth MTPJ.  Assessment: 1.  Ulcer left hallux and subfifth MTPJ left secondary to diabetes mellitus 2. diabetes mellitus w/ peripheral neuropathy 3. H/o partial first ray amputation right foot   Plan of Care:  1. Patient was evaluated. 2. medically necessary excisional debridement including subcutaneous tissue was performed using a tissue nipper and a chisel blade. Excisional debridement of all the necrotic nonviable tissue down to healthy bleeding viable tissue was performed with post-debridement measurements same as pre-. 3. the wound was cleansed and dry sterile dressing applied. 4.  Continue home health dressing changes weekly  5.  Mechanical debridement of nails 1-5 was performed bilateral using nail nipper without incident or bleeding  6.  Postoperative shoe dispensed today.  Weightbearing as tolerated.   7.  Patient is to return to clinic in 3 weeks.   Edrick Kins, DPM [facility]  Dr. Edrick Kins,  DPM    [address]                                        [place], [place] [address]                Office [phone]  Fax [phone]

## 2020-05-31 DIAGNOSIS — G9341 Metabolic encephalopathy: Secondary | ICD-10-CM | POA: Diagnosis not present

## 2020-05-31 DIAGNOSIS — N186 End stage renal disease: Secondary | ICD-10-CM | POA: Diagnosis not present

## 2020-05-31 DIAGNOSIS — N3000 Acute cystitis without hematuria: Secondary | ICD-10-CM | POA: Diagnosis not present

## 2020-05-31 DIAGNOSIS — E1122 Type 2 diabetes mellitus with diabetic chronic kidney disease: Secondary | ICD-10-CM | POA: Diagnosis not present

## 2020-05-31 DIAGNOSIS — E1143 Type 2 diabetes mellitus with diabetic autonomic (poly)neuropathy: Secondary | ICD-10-CM | POA: Diagnosis not present

## 2020-05-31 DIAGNOSIS — I120 Hypertensive chronic kidney disease with stage 5 chronic kidney disease or end stage renal disease: Secondary | ICD-10-CM | POA: Diagnosis not present

## 2020-06-01 DIAGNOSIS — Z992 Dependence on renal dialysis: Secondary | ICD-10-CM | POA: Diagnosis not present

## 2020-06-01 DIAGNOSIS — N186 End stage renal disease: Secondary | ICD-10-CM | POA: Diagnosis not present

## 2020-06-04 DIAGNOSIS — N186 End stage renal disease: Secondary | ICD-10-CM | POA: Diagnosis not present

## 2020-06-04 DIAGNOSIS — Z992 Dependence on renal dialysis: Secondary | ICD-10-CM | POA: Diagnosis not present

## 2020-06-05 DIAGNOSIS — G9341 Metabolic encephalopathy: Secondary | ICD-10-CM | POA: Diagnosis not present

## 2020-06-05 DIAGNOSIS — I120 Hypertensive chronic kidney disease with stage 5 chronic kidney disease or end stage renal disease: Secondary | ICD-10-CM | POA: Diagnosis not present

## 2020-06-05 DIAGNOSIS — N186 End stage renal disease: Secondary | ICD-10-CM | POA: Diagnosis not present

## 2020-06-05 DIAGNOSIS — E1143 Type 2 diabetes mellitus with diabetic autonomic (poly)neuropathy: Secondary | ICD-10-CM | POA: Diagnosis not present

## 2020-06-05 DIAGNOSIS — N3000 Acute cystitis without hematuria: Secondary | ICD-10-CM | POA: Diagnosis not present

## 2020-06-05 DIAGNOSIS — E1122 Type 2 diabetes mellitus with diabetic chronic kidney disease: Secondary | ICD-10-CM | POA: Diagnosis not present

## 2020-06-06 DIAGNOSIS — Z992 Dependence on renal dialysis: Secondary | ICD-10-CM | POA: Diagnosis not present

## 2020-06-06 DIAGNOSIS — N186 End stage renal disease: Secondary | ICD-10-CM | POA: Diagnosis not present

## 2020-06-08 DIAGNOSIS — Z992 Dependence on renal dialysis: Secondary | ICD-10-CM | POA: Diagnosis not present

## 2020-06-08 DIAGNOSIS — N186 End stage renal disease: Secondary | ICD-10-CM | POA: Diagnosis not present

## 2020-06-10 DIAGNOSIS — N186 End stage renal disease: Secondary | ICD-10-CM | POA: Diagnosis not present

## 2020-06-10 DIAGNOSIS — Z992 Dependence on renal dialysis: Secondary | ICD-10-CM | POA: Diagnosis not present

## 2020-06-11 DIAGNOSIS — N186 End stage renal disease: Secondary | ICD-10-CM | POA: Diagnosis not present

## 2020-06-11 DIAGNOSIS — Z992 Dependence on renal dialysis: Secondary | ICD-10-CM | POA: Diagnosis not present

## 2020-06-12 DIAGNOSIS — N3000 Acute cystitis without hematuria: Secondary | ICD-10-CM | POA: Diagnosis not present

## 2020-06-12 DIAGNOSIS — I120 Hypertensive chronic kidney disease with stage 5 chronic kidney disease or end stage renal disease: Secondary | ICD-10-CM | POA: Diagnosis not present

## 2020-06-12 DIAGNOSIS — N186 End stage renal disease: Secondary | ICD-10-CM | POA: Diagnosis not present

## 2020-06-12 DIAGNOSIS — G9341 Metabolic encephalopathy: Secondary | ICD-10-CM | POA: Diagnosis not present

## 2020-06-12 DIAGNOSIS — E1143 Type 2 diabetes mellitus with diabetic autonomic (poly)neuropathy: Secondary | ICD-10-CM | POA: Diagnosis not present

## 2020-06-12 DIAGNOSIS — E1122 Type 2 diabetes mellitus with diabetic chronic kidney disease: Secondary | ICD-10-CM | POA: Diagnosis not present

## 2020-06-13 DIAGNOSIS — Z992 Dependence on renal dialysis: Secondary | ICD-10-CM | POA: Diagnosis not present

## 2020-06-13 DIAGNOSIS — N186 End stage renal disease: Secondary | ICD-10-CM | POA: Diagnosis not present

## 2020-06-15 DIAGNOSIS — N186 End stage renal disease: Secondary | ICD-10-CM | POA: Diagnosis not present

## 2020-06-15 DIAGNOSIS — Z992 Dependence on renal dialysis: Secondary | ICD-10-CM | POA: Diagnosis not present

## 2020-06-18 DIAGNOSIS — N186 End stage renal disease: Secondary | ICD-10-CM | POA: Diagnosis not present

## 2020-06-18 DIAGNOSIS — Z992 Dependence on renal dialysis: Secondary | ICD-10-CM | POA: Diagnosis not present

## 2020-06-19 DIAGNOSIS — I10 Essential (primary) hypertension: Secondary | ICD-10-CM | POA: Diagnosis not present

## 2020-06-19 DIAGNOSIS — N3000 Acute cystitis without hematuria: Secondary | ICD-10-CM | POA: Diagnosis not present

## 2020-06-19 DIAGNOSIS — E782 Mixed hyperlipidemia: Secondary | ICD-10-CM | POA: Diagnosis not present

## 2020-06-19 DIAGNOSIS — I120 Hypertensive chronic kidney disease with stage 5 chronic kidney disease or end stage renal disease: Secondary | ICD-10-CM | POA: Diagnosis not present

## 2020-06-19 DIAGNOSIS — N186 End stage renal disease: Secondary | ICD-10-CM | POA: Diagnosis not present

## 2020-06-19 DIAGNOSIS — N183 Chronic kidney disease, stage 3 unspecified: Secondary | ICD-10-CM | POA: Diagnosis not present

## 2020-06-19 DIAGNOSIS — I129 Hypertensive chronic kidney disease with stage 1 through stage 4 chronic kidney disease, or unspecified chronic kidney disease: Secondary | ICD-10-CM | POA: Diagnosis not present

## 2020-06-19 DIAGNOSIS — E1122 Type 2 diabetes mellitus with diabetic chronic kidney disease: Secondary | ICD-10-CM | POA: Diagnosis not present

## 2020-06-19 DIAGNOSIS — E1143 Type 2 diabetes mellitus with diabetic autonomic (poly)neuropathy: Secondary | ICD-10-CM | POA: Diagnosis not present

## 2020-06-19 DIAGNOSIS — G9341 Metabolic encephalopathy: Secondary | ICD-10-CM | POA: Diagnosis not present

## 2020-06-20 DIAGNOSIS — Z992 Dependence on renal dialysis: Secondary | ICD-10-CM | POA: Diagnosis not present

## 2020-06-20 DIAGNOSIS — N186 End stage renal disease: Secondary | ICD-10-CM | POA: Diagnosis not present

## 2020-06-22 DIAGNOSIS — N186 End stage renal disease: Secondary | ICD-10-CM | POA: Diagnosis not present

## 2020-06-22 DIAGNOSIS — Z992 Dependence on renal dialysis: Secondary | ICD-10-CM | POA: Diagnosis not present

## 2020-06-25 ENCOUNTER — Other Ambulatory Visit: Payer: Self-pay

## 2020-06-25 ENCOUNTER — Ambulatory Visit (INDEPENDENT_AMBULATORY_CARE_PROVIDER_SITE_OTHER): Payer: Medicare HMO | Admitting: Podiatry

## 2020-06-25 DIAGNOSIS — E119 Type 2 diabetes mellitus without complications: Secondary | ICD-10-CM | POA: Diagnosis not present

## 2020-06-25 DIAGNOSIS — Z992 Dependence on renal dialysis: Secondary | ICD-10-CM | POA: Diagnosis not present

## 2020-06-25 DIAGNOSIS — L97522 Non-pressure chronic ulcer of other part of left foot with fat layer exposed: Secondary | ICD-10-CM

## 2020-06-25 DIAGNOSIS — N186 End stage renal disease: Secondary | ICD-10-CM | POA: Diagnosis not present

## 2020-06-25 DIAGNOSIS — E0843 Diabetes mellitus due to underlying condition with diabetic autonomic (poly)neuropathy: Secondary | ICD-10-CM

## 2020-06-26 DIAGNOSIS — I120 Hypertensive chronic kidney disease with stage 5 chronic kidney disease or end stage renal disease: Secondary | ICD-10-CM | POA: Diagnosis not present

## 2020-06-26 DIAGNOSIS — N3000 Acute cystitis without hematuria: Secondary | ICD-10-CM | POA: Diagnosis not present

## 2020-06-26 DIAGNOSIS — N186 End stage renal disease: Secondary | ICD-10-CM | POA: Diagnosis not present

## 2020-06-26 DIAGNOSIS — E1143 Type 2 diabetes mellitus with diabetic autonomic (poly)neuropathy: Secondary | ICD-10-CM | POA: Diagnosis not present

## 2020-06-26 DIAGNOSIS — E1122 Type 2 diabetes mellitus with diabetic chronic kidney disease: Secondary | ICD-10-CM | POA: Diagnosis not present

## 2020-06-26 DIAGNOSIS — G9341 Metabolic encephalopathy: Secondary | ICD-10-CM | POA: Diagnosis not present

## 2020-06-26 NOTE — Progress Notes (Signed)
Subjective:  49 y.o. male with PMHx of diabetes mellitus, ESRD, presenting for follow-up evaluation of an ulcer that is developed to the left hallux. Patient states that the nurse comes out weekly to change the dressings. He states that the ulcer to the subfifth MTPJ has healed completely. The nurse continues to apply silver alginate to the left hallux wound. He also presents for a routine diabetic foot exam  Past Medical History:  Diagnosis Date  . Anemia   . Anxiety   . CHF (congestive heart failure) (HCC)    diastolic dysfunction  . Chronic abdominal pain   . COVID-19 07/2019  . Depression   . Esophagitis   . ESRD on hemodialysis ([place])   . Eye hemorrhage   . Gastritis   . Gastroparesis   . GERD (gastroesophageal reflux disease)   . Glaucoma   . Helicobacter pylori (H. pylori) infection 2016   ERADICATION DOCUMENTED VIA STOOL TEST in AUG 2016 at [place]  . Hiccups   . Hypertension   . Nausea and vomiting    chronic, recurrent  . Neuropathy    feet  . Renal insufficiency   . Type 2 diabetes mellitus with complications (HCC)    diagnosed around age 72    Objective/Physical Exam General: The patient is alert and oriented x3 in no acute distress.  Dermatology:  Wound #1 noted to the left hallux measuring approximately 0.7x0.7 x 0.2 cm (LxWxD).   Wound #2 is noted to the left subfifth MTPJ has healed. Complete reepithelialization has occurred. There is no open wound or lesion noted.  To the noted ulceration(s), there is no eschar. There is a moderate amount of slough, fibrin, and necrotic tissue noted. Granulation tissue and wound base is red. There is a minimal amount of serosanguineous drainage noted. There is no exposed bone muscle-tendon ligament or joint. There is no malodor. Periwound integrity is intact. Skin is warm, dry and supple bilateral lower extremities.  Thickened, hyperkeratotic, dystrophic, elongated nails also noted bilateral  Vascular: Palpable pedal  pulses bilaterally. No edema or erythema noted. Capillary refill within normal limits.  Neurological: Epicritic and protective threshold diminished bilaterally.   Musculoskeletal Exam: History of partial first ray amputation right foot  Radiographic exam taken 05/30/2020: No osteolytic erosion or periosteal reactions noted.  No evidence of osteomyelitis.  Cortices appear intact.  Joint spaces preserved.  Os they sicles noted to the fifth MTPJ.  Assessment: 1.  Ulcer left hallux and subfifth MTPJ left secondary to diabetes mellitus 2. diabetes mellitus w/ peripheral neuropathy 3. H/o partial first ray amputation right foot   Plan of Care:  1. Patient was evaluated. Comprehensive diabetic foot exam was performed today 2. medically necessary excisional debridement including subcutaneous tissue was performed using a tissue nipper and a chisel blade. Excisional debridement of all the necrotic nonviable tissue down to healthy bleeding viable tissue was performed with post-debridement measurements same as pre-. 3. the wound was cleansed and dry sterile dressing applied. 4.  Continue home health dressing changes weekly  5. Continue weightbearing in postoperative shoe 6. Return to clinic in 4 weeks, at this time we will set up an appointment for new diabetic shoes   Edrick Kins, DPM [facility]  Dr. Edrick Kins, DPM    [address]  [place], [place] [address]                Office [phone]  Fax [phone]

## 2020-06-27 DIAGNOSIS — N186 End stage renal disease: Secondary | ICD-10-CM | POA: Diagnosis not present

## 2020-06-27 DIAGNOSIS — Z992 Dependence on renal dialysis: Secondary | ICD-10-CM | POA: Diagnosis not present

## 2020-06-28 ENCOUNTER — Encounter (INDEPENDENT_AMBULATORY_CARE_PROVIDER_SITE_OTHER): Payer: Medicare HMO

## 2020-06-28 ENCOUNTER — Ambulatory Visit (INDEPENDENT_AMBULATORY_CARE_PROVIDER_SITE_OTHER): Payer: Medicare HMO | Admitting: Nurse Practitioner

## 2020-06-29 DIAGNOSIS — Z992 Dependence on renal dialysis: Secondary | ICD-10-CM | POA: Diagnosis not present

## 2020-06-29 DIAGNOSIS — N186 End stage renal disease: Secondary | ICD-10-CM | POA: Diagnosis not present

## 2020-07-02 ENCOUNTER — Encounter (INDEPENDENT_AMBULATORY_CARE_PROVIDER_SITE_OTHER): Payer: Medicare HMO | Admitting: Ophthalmology

## 2020-07-02 DIAGNOSIS — N186 End stage renal disease: Secondary | ICD-10-CM | POA: Diagnosis not present

## 2020-07-02 DIAGNOSIS — Z992 Dependence on renal dialysis: Secondary | ICD-10-CM | POA: Diagnosis not present

## 2020-07-03 ENCOUNTER — Encounter (INDEPENDENT_AMBULATORY_CARE_PROVIDER_SITE_OTHER): Payer: Self-pay | Admitting: Ophthalmology

## 2020-07-03 ENCOUNTER — Other Ambulatory Visit: Payer: Self-pay

## 2020-07-03 ENCOUNTER — Ambulatory Visit (INDEPENDENT_AMBULATORY_CARE_PROVIDER_SITE_OTHER): Payer: Medicare HMO | Admitting: Ophthalmology

## 2020-07-03 DIAGNOSIS — H4720 Unspecified optic atrophy: Secondary | ICD-10-CM

## 2020-07-03 DIAGNOSIS — N3000 Acute cystitis without hematuria: Secondary | ICD-10-CM | POA: Diagnosis not present

## 2020-07-03 DIAGNOSIS — E113551 Type 2 diabetes mellitus with stable proliferative diabetic retinopathy, right eye: Secondary | ICD-10-CM | POA: Diagnosis not present

## 2020-07-03 DIAGNOSIS — E1122 Type 2 diabetes mellitus with diabetic chronic kidney disease: Secondary | ICD-10-CM | POA: Diagnosis not present

## 2020-07-03 DIAGNOSIS — E113512 Type 2 diabetes mellitus with proliferative diabetic retinopathy with macular edema, left eye: Secondary | ICD-10-CM | POA: Diagnosis not present

## 2020-07-03 DIAGNOSIS — G9341 Metabolic encephalopathy: Secondary | ICD-10-CM | POA: Diagnosis not present

## 2020-07-03 DIAGNOSIS — E1143 Type 2 diabetes mellitus with diabetic autonomic (poly)neuropathy: Secondary | ICD-10-CM | POA: Diagnosis not present

## 2020-07-03 DIAGNOSIS — I120 Hypertensive chronic kidney disease with stage 5 chronic kidney disease or end stage renal disease: Secondary | ICD-10-CM | POA: Diagnosis not present

## 2020-07-03 DIAGNOSIS — N186 End stage renal disease: Secondary | ICD-10-CM | POA: Diagnosis not present

## 2020-07-03 NOTE — Progress Notes (Signed)
07/03/2020     CHIEF COMPLAINT Patient presents for Retina Follow Up   HISTORY OF PRESENT ILLNESS: Timothy Clark is a 49 y.o. male who presents to the clinic today for:   HPI    Retina Follow Up    Patient presents with  Diabetic Retinopathy.  In both eyes.  This started 3 months ago.  Severity is mild.  Duration of 3 months.  Since onset it is gradually worsening.          Comments    3 Month F/U OU  Pt sts VA OU is "getting kind of dull" over time. No other new symptoms reported OU. A1c: 7.4, 05/2020 LBS: 111 this AM       Last edited by Rockie Neighbours, [place] on 07/03/2020  1:56 PM. (History)      Referring physician: [name], MD [place],  [place] [address]  HISTORICAL INFORMATION:   Selected notes from the MEDICAL RECORD NUMBER    Lab Results  Component Value Date   HGBA1C 8.6 (H) 03/01/2020     CURRENT MEDICATIONS: Current Outpatient Medications (Ophthalmic Drugs)  Medication Sig  . brimonidine (ALPHAGAN) 0.15 % ophthalmic solution Place 1 drop into both eyes in the morning and at bedtime.   . dorzolamide-timolol (COSOPT) 22.3-6.8 MG/ML ophthalmic solution Place 1 drop into both eyes 2 (two) times daily.    No current facility-administered medications for this visit. (Ophthalmic Drugs)   Current Outpatient Medications (Other)  Medication Sig  . ALPRAZolam (XANAX) 0.5 MG tablet Take 0.5 mg by mouth 2 (two) times daily as needed for anxiety.   [name] amLODipine (NORVASC) 5 MG tablet Take 5 mg by mouth daily.  [name] atorvastatin (LIPITOR) 10 MG tablet Take 10 mg by mouth at bedtime.   . carvedilol (COREG) 25 MG tablet Take 25 mg by mouth 2 (two) times daily with a meal.  . cloNIDine (CATAPRES) 0.2 MG tablet Take 0.2 mg by mouth 2 (two) times daily.   . cyanocobalamin (,VITAMIN B-12,) 1000 MCG/ML injection Inject 1,000 mcg into the muscle every 30 (thirty) days.   [name] dicyclomine (BENTYL) 10 MG capsule Take 1 capsule (10 mg total) by mouth every  6 (six) hours as needed (abd pain and spasm.).  [name] folic acid (FOLVITE) 1 MG tablet Take 1 mg by mouth daily.  . metoCLOPramide (REGLAN) 5 MG tablet Take 1 tablet (5 mg total) by mouth every 12 (twelve) hours.  [name] NOVOLOG MIX 70/30 FLEXPEN (70-30) 100 UNIT/ML FlexPen 35-55 Units by Subconjunctival route See admin instructions. Inject 55 units subcutaneously in the morning & inject 35 units subcutaneously in the evening.  [name] oxyCODONE-acetaminophen (PERCOCET) 5-325 MG tablet Take 1-2 tablets by mouth every 6 (six) hours as needed for moderate pain or severe pain.  . pantoprazole (PROTONIX) 40 MG tablet Take 1 tablet (40 mg total) by mouth 2 (two) times daily before a meal.  . sertraline (ZOLOFT) 100 MG tablet Take 100 mg by mouth daily.  . sitaGLIPtin (JANUVIA) 25 MG tablet Take 25 mg by mouth daily.   . sucralfate (CARAFATE) 1 g tablet Take 1 g by mouth in the morning, at noon, and at bedtime.   . torsemide (DEMADEX) 20 MG tablet Take 40 mg by mouth daily.   No current facility-administered medications for this visit. (Other)      REVIEW OF SYSTEMS:    ALLERGIES Allergies  Allergen Reactions  . Donnatal [Phenobarbital-Belladonna Alk] Anaphylaxis and Other (See Comments)  Reaction to GI cocktail  . Fish Allergy Anaphylaxis, Hives and Rash  . Haldol [Haloperidol Lactate] Other (See Comments)    Chest Pain  . Maalox [Calcium Carbonate Antacid] Anaphylaxis and Other (See Comments)    Reaction to GI cocktail  . Shellfish Allergy Hives    Per patient: Patient stated he breaks out in hives when eating shellfish  . Aspirin Hives and Itching  . Doxycycline Hives, Nausea And Vomiting and Other (See Comments)    Severe   . Keflex [Cephalexin] Hives, Itching and Nausea And Vomiting  . Marinol [Dronabinol] Nausea And Vomiting and Other (See Comments)    Caused worsening vomiting.   . Other Swelling  . Penicillins Hives, Itching, Other (See Comments) and Rash    Has patient had a PCN  reaction causing immediate rash, facial/tongue/throat swelling, SOB or lightheadedness with hypotension: yes Has patient had a PCN reaction causing severe rash involving mucus membranes or skin necrosis: yes Has patient had a PCN reaction that required hospitalization no Has patient had a PCN reaction occurring within the last 10 years: yes If all of the above answers are "NO", then may proceed with Cephalospor  . Bactrim [Sulfamethoxazole-Trimethoprim] Itching    PAST MEDICAL HISTORY Past Medical History:  Diagnosis Date  . Anemia   . Anxiety   . CHF (congestive heart failure) (HCC)    diastolic dysfunction  . Chronic abdominal pain   . COVID-19 07/2019  . Depression   . Esophagitis   . ESRD on hemodialysis ([place])   . Eye hemorrhage   . Gastritis   . Gastroparesis   . GERD (gastroesophageal reflux disease)   . Glaucoma   . Helicobacter pylori (H. pylori) infection 2016   ERADICATION DOCUMENTED VIA STOOL TEST in AUG 2016 at [facility]  . Hiccups   . Hypertension   . Nausea and vomiting    chronic, recurrent  . Neuropathy    feet  . Renal insufficiency   . Type 2 diabetes mellitus with complications (HCC)    diagnosed around age 17   Past Surgical History:  Procedure Laterality Date  . AMPUTATION TOE Right    great toe  . AV FISTULA INSERTION W/ RF MAGNETIC GUIDANCE Left 04/06/2019   Procedure: AV FISTULA INSERTION W/RF MAGNETIC GUIDANCE;  Surgeon: Katha Cabal, MD;  Location: [facility];  Service: Cardiovascular;  Laterality: Left;  . AV FISTULA PLACEMENT Left 05/06/2019   Procedure: INSERTION OF ARTERIOVENOUS (AV) GORE-TEX GRAFT ARM ( FOREARM LOOP );  Surgeon: Katha Cabal, MD;  Location: [facility];  Service: Vascular;  Laterality: Left;  . AV FISTULA PLACEMENT Left 03/21/2020   Procedure: INSERTION OF ARTERIOVENOUS (AV) GORE-TEX GRAFT ARM ( BRACHIAL AXILLARY );  Surgeon: Katha Cabal, MD;  Location: [facility];  Service: Vascular;  Laterality: Left;    . CATARACT EXTRACTION W/PHACO Left 05/19/2016   Procedure: CATARACT EXTRACTION PHACO AND INTRAOCULAR LENS PLACEMENT LEFT EYE;  Surgeon: Tonny Branch, MD;  Location: [facility];  Service: Ophthalmology;  Laterality: Left;  CDE: 7.30  . CATARACT EXTRACTION W/PHACO Right 06/23/2016   Procedure: CATARACT EXTRACTION PHACO AND INTRAOCULAR LENS PLACEMENT RIGHT EYE CDE=9.87;  Surgeon: Tonny Branch, MD;  Location: [facility];  Service: Ophthalmology;  Laterality: Right;  right  . DIALYSIS/PERMA CATHETER REMOVAL N/A 07/19/2019   Procedure: DIALYSIS/PERMA CATHETER REMOVAL;  Surgeon: Katha Cabal, MD;  Location: [facility];  Service: Cardiovascular;  Laterality: N/A;  . ESOPHAGOGASTRODUODENOSCOPY  2015   Dr. Britta Mccreedy  .  ESOPHAGOGASTRODUODENOSCOPY N/A 10/26/2014   RMR: Distal esophagititis-likely reflux related although an element of pill induced injuruy no exclueded  status post biopsy. Diffusely abnormal gastric mucosa of uncertain signigicane -status post gastric biopsy. Focal area of excoriation in the cardia most consistant with a trauma of heaving.   . ESOPHAGOGASTRODUODENOSCOPY  08/2015   [facility]: mild esophagitis and old gastric contents  . EYE SURGERY Left 2015   stent placed to left eye  . EYE SURGERY Right 06/2019   per patient- to remove scar tissue  . INCISION AND DRAINAGE OF WOUND Right 07/16/2017   Procedure: IRRIGATION AND DEBRIDEMENT OF SOFT TISSUE OF ULCERATION RIGHT FOOT;  Surgeon: Caprice Beaver, DPM;  Location: [facility];  Service: Podiatry;  Laterality: Right;  . PERIPHERAL VASCULAR THROMBECTOMY Left 09/27/2019   Procedure: PERIPHERAL VASCULAR THROMBECTOMY;  Surgeon: Katha Cabal, MD;  Location: [facility];  Service: Cardiovascular;  Laterality: Left;  . PERIPHERAL VASCULAR THROMBECTOMY Left 10/27/2019   Procedure: PERIPHERAL VASCULAR THROMBECTOMY;  Surgeon: Algernon Huxley, MD;  Location: [facility];  Service: Cardiovascular;  Laterality: Left;  . PERIPHERAL  VASCULAR THROMBECTOMY Left 12/13/2019   Procedure: PERIPHERAL VASCULAR THROMBECTOMY;  Surgeon: Katha Cabal, MD;  Location: [facility];  Service: Cardiovascular;  Laterality: Left;    FAMILY HISTORY Family History  Problem Relation Age of Onset  . Ovarian cancer Mother   . Heart attack Father   . Colon polyps Father   . Cervical cancer Sister   . Diabetes Sister   . Colon cancer Neg Hx     SOCIAL HISTORY Social History   Tobacco Use  . Smoking status: Never Smoker  . Smokeless tobacco: Never Used  Vaping Use  . Vaping Use: Never used  Substance Use Topics  . Alcohol use: No    Alcohol/week: 0.0 standard drinks  . Drug use: No         OPHTHALMIC EXAM:  Base Eye Exam    Visual Acuity (ETDRS)      Right Left   Dist [place] 20/400 20/50 +1   Dist ph [place] NI NI       Tonometry (Tonopen, 1:57 PM)      Right Left   Pressure 15 14       Pupils      Pupils Dark Light Shape React APD   Right PERRL 1 1 Round Minimal None   Left PERRL 1 1 Round Minimal None       Visual Fields (Counting fingers)      Left Right   Restrictions Total superior temporal, inferior temporal, superior nasal, inferior nasal deficiencies Total superior temporal, inferior temporal, superior nasal, inferior nasal deficiencies       Extraocular Movement      Right Left    Full Full       Neuro/Psych    Oriented x3: Yes   Mood/Affect: Normal       Dilation    Both eyes: 1.0% Mydriacyl, 2.5% Phenylephrine @ 2:00 PM        Slit Lamp and Fundus Exam    External Exam      Right Left   External Normal Normal       Slit Lamp Exam      Right Left   Lids/Lashes Normal Normal   Conjunctiva/Sclera White and quiet White and quiet,, good coverage of tube   Cornea Clear Clear   Anterior Chamber Tube shunt at 10 o'clock Tube shunt enters at 2  o'clock position into anterior chamber   Iris Round and reactive Round and reactive   Lens Centered posterior chamber intraocular lens  Posterior chamber intraocular lens   Anterior Vitreous Normal No cells       Fundus Exam      Right Left   Posterior Vitreous Pre-retinal hemorrhage, small nasal Clear   Disc 1+ Pallor 3+ Pallor,  stable   C/D Ratio 0.75 0.75   Macula No active maculopathy Attached,   Vessels PDR-quiet Proliferative diabetic retinopathy, no obvious new neovascularization   Periphery Good PRP, nearly wall-to-wall laser Completely attached with good PRP moderate haze          IMAGING AND PROCEDURES  Imaging and Procedures for 07/03/20  OCT, Retina - OU - Both Eyes       Right Eye Quality was good. Scan locations included subfoveal. Central Foveal Thickness: 281.   Left Eye Quality was good. Scan locations included subfoveal. Central Foveal Thickness: 356.   Notes Minor CME perifoveal OS on temporal margin, small, chronic                ASSESSMENT/PLAN:  Optic atrophy, left eye Severe optic atrophy likely from combination of perfusion abnormality from proliferative diabetic retinopathy as well as previous neovascular glaucoma and residual damage, stable overall at this time with good acuity  Proliferative diabetic retinopathy of left eye with macular edema associated with type 2 diabetes mellitus (HCC) Minor perifoveal CME treated some 3 months previously left eye, no active neovascular disease however  I will refrain from further treatment left eye at this time because noncentral involved yet threatened vision loss from progressive optic atrophy.  Stable treated proliferative diabetic retinopathy of right eye determined by examination associated with type 2 diabetes mellitus ([place]) The nature of regressed proliferative diabetic retinopathy was discussed with the patient. The patient was advised to maintain good glucose, blood pressure, monitor kidney function and serum lipid control as advised by personal physician. Rare risk for reactivation of progression exist with untreated severe  anemia, untreated renal failure, untreated heart failure, and smoking. Complete avoidance of smoking was recommended. The chance of recurrent proliferative diabetic retinopathy was discussed as well as the chance of vitreous hemorrhage for which further treatments may be necessary.   Explained to the patient that the quiescent  proliferative diabetic retinopathy disease is unlikely to ever worsen.  Worsening factors would include however severe anemia, hypertension out-of-control or impending renal failure.      ICD-10-CM   1. Proliferative diabetic retinopathy of left eye with macular edema associated with type 2 diabetes mellitus (HCC)  Y19.5093 OCT, Retina - OU - Both Eyes  2. Optic atrophy, left eye  H47.20   3. Stable treated proliferative diabetic retinopathy of right eye determined by examination associated with type 2 diabetes mellitus ([place])  O67.1245     1.  History of neovascular glaucoma OU status post tube shunt overall stable  2.  PDR OU overall stable, optic atrophy accounts for visual acuity left eye although the left eye still retains best acuity  3.  OD, small preretinal hemorrhage nasal to the optic nerve not likely active PDR because of presence of wall-to-wall PRP will continue to observe  4.  Follow-up with Dr. Carolynn Sayers for glaucoma management as scheduled, around December 2021  Ophthalmic Meds Ordered this visit:  No orders of the defined types were placed in this encounter.      Return in about 3 months (around 10/03/2020) for DILATE OU, COLOR  FP.  There are no Patient Instructions on file for this visit.   Explained the diagnoses, plan, and follow up with the patient and they expressed understanding.  Patient expressed understanding of the importance of proper follow up care.   Clent Demark Dalyn Kjos M.D. Diseases & Surgery of the Retina and Vitreous Retina & Diabetic [place] 07/03/20     Abbreviations: M myopia (nearsighted); A astigmatism; H hyperopia  (farsighted); P presbyopia; Mrx spectacle prescription;  CTL contact lenses; OD right eye; OS left eye; OU both eyes  XT exotropia; ET esotropia; PEK punctate epithelial keratitis; PEE punctate epithelial erosions; DES dry eye syndrome; MGD meibomian gland dysfunction; ATs artificial tears; PFAT's preservative free artificial tears; [place] nuclear sclerotic cataract; PSC posterior subcapsular cataract; ERM epi-retinal membrane; PVD posterior vitreous detachment; RD retinal detachment; DM diabetes mellitus; DR diabetic retinopathy; NPDR non-proliferative diabetic retinopathy; PDR proliferative diabetic retinopathy; CSME clinically significant macular edema; DME diabetic macular edema; dbh dot blot hemorrhages; CWS cotton wool spot; POAG primary open angle glaucoma; C/D cup-to-disc ratio; HVF humphrey visual field; GVF goldmann visual field; OCT optical coherence tomography; IOP intraocular pressure; BRVO Branch retinal vein occlusion; CRVO central retinal vein occlusion; CRAO central retinal artery occlusion; BRAO branch retinal artery occlusion; RT retinal tear; SB scleral buckle; PPV pars plana vitrectomy; VH Vitreous hemorrhage; PRP panretinal laser photocoagulation; IVK intravitreal kenalog; VMT vitreomacular traction; MH Macular hole;  NVD neovascularization of the disc; NVE neovascularization elsewhere; AREDS age related eye disease study; ARMD age related macular degeneration; POAG primary open angle glaucoma; EBMD epithelial/anterior basement membrane dystrophy; ACIOL anterior chamber intraocular lens; IOL intraocular lens; PCIOL posterior chamber intraocular lens; Phaco/IOL phacoemulsification with intraocular lens placement; [place] photorefractive keratectomy; LASIK laser assisted in situ keratomileusis; HTN hypertension; DM diabetes mellitus; COPD chronic obstructive pulmonary disease

## 2020-07-03 NOTE — Assessment & Plan Note (Signed)
Severe optic atrophy likely from combination of perfusion abnormality from proliferative diabetic retinopathy as well as previous neovascular glaucoma and residual damage, stable overall at this time with good acuity

## 2020-07-03 NOTE — Assessment & Plan Note (Signed)
Minor perifoveal CME treated some 3 months previously left eye, no active neovascular disease however  I will refrain from further treatment left eye at this time because noncentral involved yet threatened vision loss from progressive optic atrophy.

## 2020-07-03 NOTE — Assessment & Plan Note (Signed)

## 2020-07-04 DIAGNOSIS — Z992 Dependence on renal dialysis: Secondary | ICD-10-CM | POA: Diagnosis not present

## 2020-07-04 DIAGNOSIS — N186 End stage renal disease: Secondary | ICD-10-CM | POA: Diagnosis not present

## 2020-07-06 DIAGNOSIS — N186 End stage renal disease: Secondary | ICD-10-CM | POA: Diagnosis not present

## 2020-07-06 DIAGNOSIS — Z992 Dependence on renal dialysis: Secondary | ICD-10-CM | POA: Diagnosis not present

## 2020-07-09 DIAGNOSIS — N186 End stage renal disease: Secondary | ICD-10-CM | POA: Diagnosis not present

## 2020-07-09 DIAGNOSIS — Z992 Dependence on renal dialysis: Secondary | ICD-10-CM | POA: Diagnosis not present

## 2020-07-10 DIAGNOSIS — E7849 Other hyperlipidemia: Secondary | ICD-10-CM | POA: Diagnosis not present

## 2020-07-10 DIAGNOSIS — Z0001 Encounter for general adult medical examination with abnormal findings: Secondary | ICD-10-CM | POA: Diagnosis not present

## 2020-07-10 DIAGNOSIS — R451 Restlessness and agitation: Secondary | ICD-10-CM | POA: Diagnosis not present

## 2020-07-10 DIAGNOSIS — G9341 Metabolic encephalopathy: Secondary | ICD-10-CM | POA: Diagnosis not present

## 2020-07-10 DIAGNOSIS — I120 Hypertensive chronic kidney disease with stage 5 chronic kidney disease or end stage renal disease: Secondary | ICD-10-CM | POA: Diagnosis not present

## 2020-07-10 DIAGNOSIS — N186 End stage renal disease: Secondary | ICD-10-CM | POA: Diagnosis not present

## 2020-07-10 DIAGNOSIS — Z992 Dependence on renal dialysis: Secondary | ICD-10-CM | POA: Diagnosis not present

## 2020-07-10 DIAGNOSIS — F419 Anxiety disorder, unspecified: Secondary | ICD-10-CM | POA: Diagnosis not present

## 2020-07-10 DIAGNOSIS — E1122 Type 2 diabetes mellitus with diabetic chronic kidney disease: Secondary | ICD-10-CM | POA: Diagnosis not present

## 2020-07-10 DIAGNOSIS — D509 Iron deficiency anemia, unspecified: Secondary | ICD-10-CM | POA: Diagnosis not present

## 2020-07-10 DIAGNOSIS — Z125 Encounter for screening for malignant neoplasm of prostate: Secondary | ICD-10-CM | POA: Diagnosis not present

## 2020-07-10 DIAGNOSIS — E1143 Type 2 diabetes mellitus with diabetic autonomic (poly)neuropathy: Secondary | ICD-10-CM | POA: Diagnosis not present

## 2020-07-10 DIAGNOSIS — I10 Essential (primary) hypertension: Secondary | ICD-10-CM | POA: Diagnosis not present

## 2020-07-10 DIAGNOSIS — I129 Hypertensive chronic kidney disease with stage 1 through stage 4 chronic kidney disease, or unspecified chronic kidney disease: Secondary | ICD-10-CM | POA: Diagnosis not present

## 2020-07-10 DIAGNOSIS — N3000 Acute cystitis without hematuria: Secondary | ICD-10-CM | POA: Diagnosis not present

## 2020-07-11 DIAGNOSIS — Z992 Dependence on renal dialysis: Secondary | ICD-10-CM | POA: Diagnosis not present

## 2020-07-11 DIAGNOSIS — N186 End stage renal disease: Secondary | ICD-10-CM | POA: Diagnosis not present

## 2020-07-11 NOTE — Progress Notes (Signed)
Referring Provider: [name], MD Primary Care Physician:  [name], MD Primary GI Physician: Dr. Gala Romney  Chief Complaint  Patient presents with  . gastroparesis    f/u. Doing okay now. Went to ED 1 month ago but he is much improved now    HPI:   Timothy Clark is a 49 y.o. male presenting today for follow-up of gastroparesis and GERD.  History of chronic abdominal pain, GERD, esophagitis, gastritis, suspected gastroparesis, recurrent nausea and vomiting, H. pylori s/p eradication in 2016. GES on 06/15/2015 with rapid gastric transit. [facility] questioned dumping syndrome, but EGD at [facility] January 2017 with mild esophagitis and old gastric contents. Felt clinically consistent with gastroparesis. RUQ ultrasound March 2019 normal. CT November 2019 with fatty liver, gallbladder sludge. HIDA scan normal in June 2020. No prior colonoscopy. Also with history of type 2 diabetes, diastolic dysfunction, end-stage renal disease on hemodialysis.  He was last seen in our office 12/08/2019.  He had received the Covid vaccine day prior to office visit and was not feeling well with headache and nausea at the time of his visit and ended up having an episode of vomiting at the end of his visit. Stated prior to receiving the vaccine, he had been doing well with Reglan once daily and Protonix twice daily.  Was also taking Carafate.  Had not been needing Phenergan.  No significant lower GI symptoms.  Had been admitted to the hospital in March for DKA.  Insulin was increased and patient stated his blood sugars were under better control.  A1c down to 8 from 10.  Most recently, patient presented to the emergency room 05/03/2020 for abdominal pain and nausea with vomiting.  Apparently he had been discharged from [facility] hospital 3 days prior for gastroparesis.  Laboratory work-up with lipase 176, glucose 271, creatinine 7.43, potassium 3.1, LFTs within normal limits, hemoglobin 12.2.  CT A/P without  contrast with no acute findings, unremarkable pancreas, no liver, gallbladder, or biliary abnormality.  Ultimately, patient ended up leaving the emergency room [facility].  He presented again to the emergency room 05/04/2020 due to ongoing abdominal pain, nausea and vomiting.  He missed dialysis that day.  Stated left superior dialysis port was infected and he was receiving antibiotics with dialysis.  Patient seemed confused and was combative.  Provider called patient's sister who was concerned about patient safety stating he was suicidal and had also threatened to kill his dad and his sister.  He was IVC and initially plan to transfer to [facility] for additional evaluation, treatment, and dialysis.  Patient had telepsych visit.  Telepsychiatrist stated there was no reason to continue patient's IVC status and IVC was reversed.  Patient did not want to be transferred to [facility] and was ultimately feeling well enough to go home.  Today:  Gastroparesis: Taking Reglan twice daily and Protonix twice daily. Reglan was increased back to twice dailiy in July following a flare. Prior to flare in September, he had fried chicken tenders. Doesn't normally eat fried foods. Hasn't had any more trouble since September. Started a new medication for the episode of confusion/aggitation he had in the hospital. Can't remember what it is called but medications come prepackaged from [facility]. No depression or anxiety. No SI or HI. No unintentional movements or twitching.   GERD: Well-controlled.  No dysphagia.   BMs daily. Taking MiraLAX when he doesn't go to dialysis (Tuesday, Thursday, Saturday, and Sunday). Stools are hard, but he does have a  bowel movement daily. No blood in the stool or black stools. No straining.   Colon cancer screening: Ready to schedule first ever colonoscopy.  States he is also needing this for kidney transplant evaluation.  Lives with dad and sister.   Called Pharmacy- Depakote and Geodon are the  new medications that have been added. Medication list updated.   Past Medical History:  Diagnosis Date  . Anemia   . Anxiety   . CHF (congestive heart failure) (HCC)    diastolic dysfunction  . Chronic abdominal pain   . COVID-19 07/2019  . Depression   . Esophagitis   . ESRD on hemodialysis [facility])    undergoing evaluation for transplant  . Eye hemorrhage   . Gastritis   . Gastroparesis   . GERD (gastroesophageal reflux disease)   . Glaucoma   . Helicobacter pylori (H. pylori) infection 2016   ERADICATION DOCUMENTED VIA STOOL TEST in AUG 2016 at [facility]  . Hiccups   . Hypertension   . Nausea and vomiting    chronic, recurrent  . Neuropathy    feet  . Renal insufficiency   . Type 2 diabetes mellitus with complications (HCC)    diagnosed around age 52    Past Surgical History:  Procedure Laterality Date  . AMPUTATION TOE Right    great toe  . AV FISTULA INSERTION W/ RF MAGNETIC GUIDANCE Left 04/06/2019   Procedure: AV FISTULA INSERTION W/RF MAGNETIC GUIDANCE;  Surgeon: Katha Cabal, MD;  Location: [place] [facility];  Service: Cardiovascular;  Laterality: Left;  . AV FISTULA PLACEMENT Left 05/06/2019   Procedure: INSERTION OF ARTERIOVENOUS (AV) GORE-TEX GRAFT ARM ( FOREARM LOOP );  Surgeon: Katha Cabal, MD;  Location: [facility];  Service: Vascular;  Laterality: Left;  . AV FISTULA PLACEMENT Left 03/21/2020   Procedure: INSERTION OF ARTERIOVENOUS (AV) GORE-TEX GRAFT ARM ( BRACHIAL AXILLARY );  Surgeon: Katha Cabal, MD;  Location: [facility] OR[facility];  Service: Vascular;  Laterality: Left;  . CATARACT EXTRACTION W/PHACO Left 05/19/2016   Procedure: CATARACT EXTRACTION PHACO AND INTRAOCULAR LENS PLACEMENT LEFT EYE;  Surgeon: Tonny Branch, MD;  Location: [facility];  Service: Ophthalmology;  Laterality: Left;  CDE: 7.30  . CATARACT EXTRACTION W/PHACO Right 06/23/2016   Procedure: CATARACT EXTRACTION PHACO AND INTRAOCULAR LENS PLACEMENT RIGHT EYE CDE=9.87;  Surgeon: Tonny Branch,  MD;  Location: [facility];  Service: Ophthalmology;  Laterality: Right;  right  . DIALYSIS/PERMA CATHETER REMOVAL N/A 07/19/2019   Procedure: DIALYSIS/PERMA CATHETER REMOVAL;  Surgeon: Katha Cabal, MD;  Location: [place] [facility];  Service: Cardiovascular;  Laterality: N/A;  . ESOPHAGOGASTRODUODENOSCOPY  2015   Dr. Britta Mccreedy  . ESOPHAGOGASTRODUODENOSCOPY N/A 10/26/2014   RMR: Distal esophagititis-likely reflux related although an element of pill induced injuruy no exclueded  status post biopsy. Diffusely abnormal gastric mucosa of uncertain signigicane -status post gastric biopsy. Focal area of excoriation in the cardia most consistant with a trauma of heaving.   . ESOPHAGOGASTRODUODENOSCOPY  08/2015   [facility]: mild esophagitis and old gastric contents  . EYE SURGERY Left 2015   stent placed to left eye  . EYE SURGERY Right 06/2019   per patient- to remove scar tissue  . INCISION AND DRAINAGE OF WOUND Right 07/16/2017   Procedure: IRRIGATION AND DEBRIDEMENT OF SOFT TISSUE OF ULCERATION RIGHT FOOT;  Surgeon: Caprice Beaver, DPM;  Location: [facility];  Service: Podiatry;  Laterality: Right;  . PERIPHERAL VASCULAR THROMBECTOMY Left 09/27/2019   Procedure: PERIPHERAL VASCULAR THROMBECTOMY;  Surgeon:  Schnier, Dolores Lory, MD;  Location: [place] [facility];  Service: Cardiovascular;  Laterality: Left;  . PERIPHERAL VASCULAR THROMBECTOMY Left 10/27/2019   Procedure: PERIPHERAL VASCULAR THROMBECTOMY;  Surgeon: Algernon Huxley, MD;  Location: [place] [facility];  Service: Cardiovascular;  Laterality: Left;  . PERIPHERAL VASCULAR THROMBECTOMY Left 12/13/2019   Procedure: PERIPHERAL VASCULAR THROMBECTOMY;  Surgeon: Katha Cabal, MD;  Location: [place] [facility];  Service: Cardiovascular;  Laterality: Left;    Current Outpatient Medications  Medication Sig Dispense Refill  . ALPRAZolam (XANAX) 0.5 MG tablet Take 0.5 mg by mouth 2 (two) times daily as needed for anxiety.     [name] amLODipine (NORVASC)  5 MG tablet Take 5 mg by mouth daily.    [name] atorvastatin (LIPITOR) 10 MG tablet Take 10 mg by mouth at bedtime.     . brimonidine (ALPHAGAN) 0.15 % ophthalmic solution Place 1 drop into both eyes in the morning and at bedtime.     . carvedilol (COREG) 25 MG tablet Take 25 mg by mouth 2 (two) times daily with a meal.    . cloNIDine (CATAPRES) 0.2 MG tablet Take 0.2 mg by mouth 2 (two) times daily.     . cyanocobalamin (,VITAMIN B-12,) 1000 MCG/ML injection Inject 1,000 mcg into the muscle every 30 (thirty) days.   1  . divalproex (DEPAKOTE) 250 MG DR tablet Take 250 mg by mouth daily.    . dorzolamide-timolol (COSOPT) 22.3-6.8 MG/ML ophthalmic solution Place 1 drop into both eyes 2 (two) times daily.     . folic acid (FOLVITE) 1 MG tablet Take 1 mg by mouth daily.    . metoCLOPramide (REGLAN) 5 MG tablet Take 1 tablet (5 mg total) by mouth every 12 (twelve) hours. 60 tablet 1  . NOVOLOG MIX 70/30 FLEXPEN (70-30) 100 UNIT/ML FlexPen 35-55 Units by Subconjunctival route See admin instructions. Inject 55 units subcutaneously in the morning & inject 35 units subcutaneously in the evening.    . ondansetron (ZOFRAN) 4 MG tablet Take 4 mg by mouth every 8 (eight) hours as needed for nausea or vomiting.    . pantoprazole (PROTONIX) 40 MG tablet Take 1 tablet (40 mg total) by mouth 2 (two) times daily before a meal. 180 tablet 3  . sertraline (ZOLOFT) 100 MG tablet Take 100 mg by mouth daily.    . sitaGLIPtin (JANUVIA) 25 MG tablet Take 25 mg by mouth daily.     . sucralfate (CARAFATE) 1 g tablet Take 1 g by mouth in the morning, at noon, and at bedtime.     . torsemide (DEMADEX) 20 MG tablet Take 40 mg by mouth daily.    . ziprasidone (GEODON) 60 MG capsule Take 60 mg by mouth 2 (two) times daily with a meal.     No current facility-administered medications for this visit.    Allergies as of 07/12/2020 - Review Complete 07/12/2020  Allergen Reaction Noted  . Donnatal [phenobarbital-belladonna alk]  Anaphylaxis and Other (See Comments) 07/12/2015  . Fish allergy Anaphylaxis, Hives, and Rash 01/14/2015  . Haldol [haloperidol lactate] Other (See Comments) 08/07/2015  . Maalox [calcium carbonate antacid] Anaphylaxis and Other (See Comments) 07/12/2015  . Shellfish allergy Hives 09/27/2019  . Aspirin Hives and Itching 03/22/2009  . Doxycycline Hives, Nausea And Vomiting, and Other (See Comments) 03/20/2014  . Keflex [cephalexin] Hives, Itching, and Nausea And Vomiting 06/22/2017  . Marinol [dronabinol] Nausea And Vomiting and Other (See Comments) 05/28/2018  . Other Swelling 03/13/2020  . Penicillins  Hives, Itching, Other (See Comments), and Rash 03/22/2009  . Bactrim [sulfamethoxazole-trimethoprim] Itching 12/06/2014    Family History  Problem Relation Age of Onset  . Ovarian cancer Mother   . Heart attack Father   . Colon polyps Father   . Cervical cancer Sister   . Diabetes Sister   . Colon cancer Neg Hx     Social History   Socioeconomic History  . Marital status: Single    Spouse name: Not on file  . Number of children: Not on file  . Years of education: Not on file  . Highest education level: Not on file  Occupational History  . Not on file  Tobacco Use  . Smoking status: Never Smoker  . Smokeless tobacco: Never Used  Vaping Use  . Vaping Use: Never used  Substance and Sexual Activity  . Alcohol use: No    Alcohol/week: 0.0 standard drinks  . Drug use: No  . Sexual activity: Yes  Other Topics Concern  . Not on file  Social History Narrative   Lives with dad and sister   Social Determinants of Health   Financial Resource Strain:   . Difficulty of Paying Living Expenses: Not on file  Food Insecurity:   . Worried About Charity fundraiser in the Last Year: Not on file  . Ran Out of Food in the Last Year: Not on file  Transportation Needs:   . Lack of Transportation (Medical): Not on file  . Lack of Transportation (Non-Medical): Not on file  Physical  Activity:   . Days of Exercise per Week: Not on file  . Minutes of Exercise per Session: Not on file  Stress:   . Feeling of Stress : Not on file  Social Connections:   . Frequency of Communication with Friends and Family: Not on file  . Frequency of Social Gatherings with Friends and Family: Not on file  . Attends Religious Services: Not on file  . Active Member of Clubs or Organizations: Not on file  . Attends Archivist Meetings: Not on file  . Marital Status: Not on file    Review of Systems: Gen: Denies fever, chills, cold or flulike symptoms, lightheadedness, dizziness, presyncope, syncope. CV: Denies chest pain or palpitations Resp: Denies dyspnea or cough GI: See HPI Heme: See HPI  Physical Exam: BP 102/68   Pulse 69   Temp (!) 97.1 F (36.2 C)   Ht '5\' 11"'  (1.803 m)   Wt 288 lb 6.4 oz (130.8 kg)   BMI 40.22 kg/m  General:   Alert and oriented. No distress noted. Pleasant and cooperative.  Head:  Normocephalic and atraumatic. Eyes:  Conjuctiva clear without scleral icterus. Heart:  S1, S2 present without murmurs appreciated. Lungs:  Clear to auscultation bilaterally. No wheezes, rales, or rhonchi. No distress.  Abdomen:  +BS, soft, non-tender and non-distended. No rebound or guarding. No HSM or masses noted. Msk:  Symmetrical without gross deformities. Normal posture. Extremities:  Without edema. Neurologic:  Alert and  oriented x4 Psych:  Normal mood and affect.

## 2020-07-12 ENCOUNTER — Other Ambulatory Visit: Payer: Self-pay

## 2020-07-12 ENCOUNTER — Encounter: Payer: Self-pay | Admitting: Gastroenterology

## 2020-07-12 ENCOUNTER — Ambulatory Visit (INDEPENDENT_AMBULATORY_CARE_PROVIDER_SITE_OTHER): Payer: Medicare HMO | Admitting: Gastroenterology

## 2020-07-12 VITALS — BP 102/68 | HR 69 | Temp 97.1°F | Ht 71.0 in | Wt 288.4 lb

## 2020-07-12 DIAGNOSIS — K5900 Constipation, unspecified: Secondary | ICD-10-CM

## 2020-07-12 DIAGNOSIS — K3184 Gastroparesis: Secondary | ICD-10-CM

## 2020-07-12 DIAGNOSIS — K219 Gastro-esophageal reflux disease without esophagitis: Secondary | ICD-10-CM

## 2020-07-12 DIAGNOSIS — Z1211 Encounter for screening for malignant neoplasm of colon: Secondary | ICD-10-CM | POA: Diagnosis not present

## 2020-07-12 NOTE — Progress Notes (Signed)
Cc'ed to pcp °

## 2020-07-12 NOTE — Assessment & Plan Note (Addendum)
Well controlled on Protonix 40 mg BID and Reglan 5 mg BID. Using Zofran as needed. Last flare in late September. Hasn't required Zofran since then. No side effects to Reglan. Discussed possible side effects to monitor for and to let us know immediately if they occur. Previously tried to decrease Reglan to once daily, but medication was increased again to twice daily in July due to flare.   Plan:  Continue Reglan 5 mg BID for now.  Counseled on side effects to Reglan including unintentional body movements, worsening depression, and SI. Advised to monitor for this and let us know immediately if this occurs.  Continue Protonix 40 mg BID.  Continue Zofran as needed.

## 2020-07-12 NOTE — Assessment & Plan Note (Addendum)
49 y.o. male in need of first ever screening colonoscopy. History of chronic constiaption not adequately managed on MiraLAX which he takes on the days he doesn't have dialysis (Tuesday, Thursday, Saturday, and Sunday). No alarm symptoms. No family history of colon cancer.  Notably, patient is undergoing evaluation for kidney transplant and is also needing colonoscopy as part of his pre-testing.  Plan: Start Linzess 145 mcg on the days he does not have dialysis.  Samples provided.  Requested progress report in 1-2 weeks. Proceed with colonoscopy with propofol with Dr. Gala Romney in the near future. The risks, benefits, and alternatives have been discussed with the patient in detail. The patient states understanding and desires to proceed.  ASA III See separate instructions for diabetes medication adjustments.  Due to history of gastroparesis and concern for nausea with colon prep, advised to take Zofran every 8 hours he is prepping for his colonoscopy. Explained the importance of bowels moving well prior to colonoscopy with need of progress report in 1-2 weeks.  Patient voiced understanding. Follow-up after procedure.

## 2020-07-12 NOTE — Assessment & Plan Note (Addendum)
Chronic. Not adequately managed on MiraLAX Tuesday, Thursday, Saturday, and Sunday (days he doesn't have dialysis). Doesn't take MiraLAX on dialysis as he doesn't want to have a BM while at dialysis. He does have BMs daily, but states they are hard. No brbpr or melena. No unintentional weight loss. No prior colonoscopy. No family history of colon cancer.   Plan:  Start Linzess 145 mcg on Tuesday, Thursday, Saturday, and Sunday.  Samples provided.  Requested a progress report in 1-2 weeks.  May consider adding MiraLAX on the days he has dialysis once he returns home as he gets home around 11:30am.  TCS with propofol with Dr. Gala Romney in the near future for colon cancer screening. The risks, benefits, and alternatives have been discussed with the patient in detail. The patient states understanding and desires to proceed.  ASA III Explained importance of bowels moving well prior to colonoscopy and the importance of calling with a progress report in 1-2 weeks. He voiced his understanding.  Follow-up after procedure.

## 2020-07-12 NOTE — Patient Instructions (Signed)
Will need first colonoscopy in the near future regarding [place]. 1 day prior to procedure: Take 1/2 dose of insulin in the morning (27.5 units) and 1/2 dose of insulin in the evening (17.5), and 1/2 dose of Januvia. Morning of your procedure: Do not take any diabetes medications the morning of your procedure.  Monitor your blood sugars closely while you are on clear liquids and correct any low blood sugars with approved sugary clear liquids.  You may go ahead and take Zofran every 8 hours while you are prepping for your colonoscopy to prevent nausea/vomiting.   Start Linzess 157mcg 30 minutes before first meal on the days that you are not going to dialysis (Tuesday, Thursday, Saturday, Sunday). We are providing you with samples today.   Please call with a progress report in 1-2 weeks on constipation. It is very important that your constipation is under better control prior to your procedure.   Continue Protonix 40 mg twice daily 30 minutes before breakfast and dinner.  Continue Reglan 5 mg twice daily 30 minutes before breakfast and dinner. Monitor for side effects including unintentional body movements, worsening depression, or suicidal thoughts and let us know if this occurs.   We will see you back after your colonoscopy. Do not hesitate to call with questions or concerns prior.   Aliene Altes, PA-C [facility]

## 2020-07-12 NOTE — Assessment & Plan Note (Signed)
Well controlled on Protonix 40 mg BID. Continue current medications.

## 2020-07-13 DIAGNOSIS — N186 End stage renal disease: Secondary | ICD-10-CM | POA: Diagnosis not present

## 2020-07-13 DIAGNOSIS — Z992 Dependence on renal dialysis: Secondary | ICD-10-CM | POA: Diagnosis not present

## 2020-07-16 DIAGNOSIS — D649 Anemia, unspecified: Secondary | ICD-10-CM | POA: Diagnosis not present

## 2020-07-16 DIAGNOSIS — E1165 Type 2 diabetes mellitus with hyperglycemia: Secondary | ICD-10-CM | POA: Diagnosis not present

## 2020-07-16 DIAGNOSIS — I10 Essential (primary) hypertension: Secondary | ICD-10-CM | POA: Diagnosis not present

## 2020-07-16 DIAGNOSIS — R569 Unspecified convulsions: Secondary | ICD-10-CM | POA: Diagnosis not present

## 2020-07-16 DIAGNOSIS — H409 Unspecified glaucoma: Secondary | ICD-10-CM | POA: Diagnosis not present

## 2020-07-16 DIAGNOSIS — N186 End stage renal disease: Secondary | ICD-10-CM | POA: Diagnosis not present

## 2020-07-16 DIAGNOSIS — Z992 Dependence on renal dialysis: Secondary | ICD-10-CM | POA: Diagnosis not present

## 2020-07-16 DIAGNOSIS — E782 Mixed hyperlipidemia: Secondary | ICD-10-CM | POA: Diagnosis not present

## 2020-07-16 DIAGNOSIS — R143 Flatulence: Secondary | ICD-10-CM | POA: Diagnosis not present

## 2020-07-16 DIAGNOSIS — F411 Generalized anxiety disorder: Secondary | ICD-10-CM | POA: Diagnosis not present

## 2020-07-17 DIAGNOSIS — E1143 Type 2 diabetes mellitus with diabetic autonomic (poly)neuropathy: Secondary | ICD-10-CM | POA: Diagnosis not present

## 2020-07-17 DIAGNOSIS — N3000 Acute cystitis without hematuria: Secondary | ICD-10-CM | POA: Diagnosis not present

## 2020-07-17 DIAGNOSIS — I120 Hypertensive chronic kidney disease with stage 5 chronic kidney disease or end stage renal disease: Secondary | ICD-10-CM | POA: Diagnosis not present

## 2020-07-17 DIAGNOSIS — N186 End stage renal disease: Secondary | ICD-10-CM | POA: Diagnosis not present

## 2020-07-17 DIAGNOSIS — G9341 Metabolic encephalopathy: Secondary | ICD-10-CM | POA: Diagnosis not present

## 2020-07-17 DIAGNOSIS — E1122 Type 2 diabetes mellitus with diabetic chronic kidney disease: Secondary | ICD-10-CM | POA: Diagnosis not present

## 2020-07-18 DIAGNOSIS — Z992 Dependence on renal dialysis: Secondary | ICD-10-CM | POA: Diagnosis not present

## 2020-07-18 DIAGNOSIS — N186 End stage renal disease: Secondary | ICD-10-CM | POA: Diagnosis not present

## 2020-07-19 ENCOUNTER — Ambulatory Visit (INDEPENDENT_AMBULATORY_CARE_PROVIDER_SITE_OTHER): Payer: Medicare HMO | Admitting: Nurse Practitioner

## 2020-07-19 ENCOUNTER — Other Ambulatory Visit: Payer: Self-pay

## 2020-07-19 ENCOUNTER — Ambulatory Visit (INDEPENDENT_AMBULATORY_CARE_PROVIDER_SITE_OTHER): Payer: Medicare HMO

## 2020-07-19 VITALS — BP 98/63 | HR 69 | Ht 72.0 in | Wt 284.0 lb

## 2020-07-19 DIAGNOSIS — N186 End stage renal disease: Secondary | ICD-10-CM

## 2020-07-19 DIAGNOSIS — Z992 Dependence on renal dialysis: Secondary | ICD-10-CM | POA: Diagnosis not present

## 2020-07-19 DIAGNOSIS — I10 Essential (primary) hypertension: Secondary | ICD-10-CM

## 2020-07-19 DIAGNOSIS — E1165 Type 2 diabetes mellitus with hyperglycemia: Secondary | ICD-10-CM | POA: Diagnosis not present

## 2020-07-19 DIAGNOSIS — E118 Type 2 diabetes mellitus with unspecified complications: Secondary | ICD-10-CM | POA: Diagnosis not present

## 2020-07-19 DIAGNOSIS — IMO0002 Reserved for concepts with insufficient information to code with codable children: Secondary | ICD-10-CM

## 2020-07-20 DIAGNOSIS — Z992 Dependence on renal dialysis: Secondary | ICD-10-CM | POA: Diagnosis not present

## 2020-07-20 DIAGNOSIS — N186 End stage renal disease: Secondary | ICD-10-CM | POA: Diagnosis not present

## 2020-07-21 DIAGNOSIS — R0689 Other abnormalities of breathing: Secondary | ICD-10-CM | POA: Diagnosis not present

## 2020-07-21 DIAGNOSIS — K3184 Gastroparesis: Secondary | ICD-10-CM | POA: Diagnosis not present

## 2020-07-21 DIAGNOSIS — I10 Essential (primary) hypertension: Secondary | ICD-10-CM | POA: Diagnosis not present

## 2020-07-21 DIAGNOSIS — E1142 Type 2 diabetes mellitus with diabetic polyneuropathy: Secondary | ICD-10-CM | POA: Diagnosis not present

## 2020-07-21 DIAGNOSIS — R569 Unspecified convulsions: Secondary | ICD-10-CM | POA: Diagnosis not present

## 2020-07-21 DIAGNOSIS — R404 Transient alteration of awareness: Secondary | ICD-10-CM | POA: Diagnosis not present

## 2020-07-21 DIAGNOSIS — R451 Restlessness and agitation: Secondary | ICD-10-CM | POA: Diagnosis not present

## 2020-07-21 DIAGNOSIS — E1122 Type 2 diabetes mellitus with diabetic chronic kidney disease: Secondary | ICD-10-CM | POA: Diagnosis not present

## 2020-07-21 DIAGNOSIS — F05 Delirium due to known physiological condition: Secondary | ICD-10-CM | POA: Diagnosis not present

## 2020-07-21 DIAGNOSIS — Z992 Dependence on renal dialysis: Secondary | ICD-10-CM | POA: Diagnosis not present

## 2020-07-21 DIAGNOSIS — R456 Violent behavior: Secondary | ICD-10-CM | POA: Diagnosis not present

## 2020-07-21 DIAGNOSIS — R410 Disorientation, unspecified: Secondary | ICD-10-CM | POA: Diagnosis not present

## 2020-07-21 DIAGNOSIS — E1143 Type 2 diabetes mellitus with diabetic autonomic (poly)neuropathy: Secondary | ICD-10-CM | POA: Diagnosis not present

## 2020-07-21 DIAGNOSIS — R1111 Vomiting without nausea: Secondary | ICD-10-CM | POA: Diagnosis not present

## 2020-07-21 DIAGNOSIS — N186 End stage renal disease: Secondary | ICD-10-CM | POA: Diagnosis not present

## 2020-07-21 DIAGNOSIS — I120 Hypertensive chronic kidney disease with stage 5 chronic kidney disease or end stage renal disease: Secondary | ICD-10-CM | POA: Diagnosis not present

## 2020-07-23 ENCOUNTER — Encounter (HOSPITAL_COMMUNITY): Payer: Self-pay

## 2020-07-23 ENCOUNTER — Observation Stay (HOSPITAL_COMMUNITY): Payer: Medicare HMO

## 2020-07-23 ENCOUNTER — Other Ambulatory Visit: Payer: Self-pay

## 2020-07-23 ENCOUNTER — Inpatient Hospital Stay (HOSPITAL_COMMUNITY)
Admission: EM | Admit: 2020-07-23 | Discharge: 2020-07-26 | DRG: 073 | Disposition: A | Discharge status: Home / Self Care | Payer: Medicare HMO | Attending: Internal Medicine | Admitting: Internal Medicine

## 2020-07-23 ENCOUNTER — Encounter (INDEPENDENT_AMBULATORY_CARE_PROVIDER_SITE_OTHER): Payer: Self-pay | Admitting: Nurse Practitioner

## 2020-07-23 DIAGNOSIS — Z8041 Family history of malignant neoplasm of ovary: Secondary | ICD-10-CM

## 2020-07-23 DIAGNOSIS — Z888 Allergy status to other drugs, medicaments and biological substances status: Secondary | ICD-10-CM | POA: Diagnosis not present

## 2020-07-23 DIAGNOSIS — H548 Legal blindness, as defined in USA: Secondary | ICD-10-CM | POA: Diagnosis present

## 2020-07-23 DIAGNOSIS — Z89411 Acquired absence of right great toe: Secondary | ICD-10-CM

## 2020-07-23 DIAGNOSIS — Z880 Allergy status to penicillin: Secondary | ICD-10-CM | POA: Diagnosis not present

## 2020-07-23 DIAGNOSIS — K2100 Gastro-esophageal reflux disease with esophagitis, without bleeding: Secondary | ICD-10-CM | POA: Diagnosis present

## 2020-07-23 DIAGNOSIS — R109 Unspecified abdominal pain: Secondary | ICD-10-CM | POA: Diagnosis not present

## 2020-07-23 DIAGNOSIS — N2581 Secondary hyperparathyroidism of renal origin: Secondary | ICD-10-CM | POA: Diagnosis present

## 2020-07-23 DIAGNOSIS — E1165 Type 2 diabetes mellitus with hyperglycemia: Secondary | ICD-10-CM | POA: Diagnosis present

## 2020-07-23 DIAGNOSIS — E1129 Type 2 diabetes mellitus with other diabetic kidney complication: Secondary | ICD-10-CM | POA: Diagnosis not present

## 2020-07-23 DIAGNOSIS — Z8371 Family history of colonic polyps: Secondary | ICD-10-CM | POA: Diagnosis not present

## 2020-07-23 DIAGNOSIS — I5032 Chronic diastolic (congestive) heart failure: Secondary | ICD-10-CM | POA: Diagnosis present

## 2020-07-23 DIAGNOSIS — R52 Pain, unspecified: Secondary | ICD-10-CM | POA: Diagnosis not present

## 2020-07-23 DIAGNOSIS — E1122 Type 2 diabetes mellitus with diabetic chronic kidney disease: Secondary | ICD-10-CM | POA: Diagnosis present

## 2020-07-23 DIAGNOSIS — K3184 Gastroparesis: Secondary | ICD-10-CM | POA: Diagnosis present

## 2020-07-23 DIAGNOSIS — D649 Anemia, unspecified: Secondary | ICD-10-CM | POA: Diagnosis not present

## 2020-07-23 DIAGNOSIS — Z833 Family history of diabetes mellitus: Secondary | ICD-10-CM | POA: Diagnosis not present

## 2020-07-23 DIAGNOSIS — K911 Postgastric surgery syndromes: Secondary | ICD-10-CM | POA: Diagnosis present

## 2020-07-23 DIAGNOSIS — G40909 Epilepsy, unspecified, not intractable, without status epilepticus: Secondary | ICD-10-CM | POA: Diagnosis present

## 2020-07-23 DIAGNOSIS — N261 Atrophy of kidney (terminal): Secondary | ICD-10-CM | POA: Diagnosis not present

## 2020-07-23 DIAGNOSIS — I509 Heart failure, unspecified: Secondary | ICD-10-CM | POA: Diagnosis not present

## 2020-07-23 DIAGNOSIS — F319 Bipolar disorder, unspecified: Secondary | ICD-10-CM | POA: Diagnosis present

## 2020-07-23 DIAGNOSIS — D696 Thrombocytopenia, unspecified: Secondary | ICD-10-CM | POA: Diagnosis present

## 2020-07-23 DIAGNOSIS — Z992 Dependence on renal dialysis: Secondary | ICD-10-CM | POA: Diagnosis not present

## 2020-07-23 DIAGNOSIS — E118 Type 2 diabetes mellitus with unspecified complications: Secondary | ICD-10-CM | POA: Diagnosis not present

## 2020-07-23 DIAGNOSIS — R1084 Generalized abdominal pain: Secondary | ICD-10-CM | POA: Diagnosis not present

## 2020-07-23 DIAGNOSIS — R456 Violent behavior: Secondary | ICD-10-CM | POA: Diagnosis not present

## 2020-07-23 DIAGNOSIS — N186 End stage renal disease: Secondary | ICD-10-CM | POA: Diagnosis present

## 2020-07-23 DIAGNOSIS — I10 Essential (primary) hypertension: Secondary | ICD-10-CM | POA: Diagnosis present

## 2020-07-23 DIAGNOSIS — Z91013 Allergy to seafood: Secondary | ICD-10-CM | POA: Diagnosis not present

## 2020-07-23 DIAGNOSIS — G8929 Other chronic pain: Secondary | ICD-10-CM | POA: Diagnosis present

## 2020-07-23 DIAGNOSIS — Z20822 Contact with and (suspected) exposure to covid-19: Secondary | ICD-10-CM | POA: Diagnosis present

## 2020-07-23 DIAGNOSIS — F419 Anxiety disorder, unspecified: Secondary | ICD-10-CM | POA: Diagnosis present

## 2020-07-23 DIAGNOSIS — Z882 Allergy status to sulfonamides status: Secondary | ICD-10-CM | POA: Diagnosis not present

## 2020-07-23 DIAGNOSIS — E1143 Type 2 diabetes mellitus with diabetic autonomic (poly)neuropathy: Principal | ICD-10-CM | POA: Diagnosis present

## 2020-07-23 DIAGNOSIS — H409 Unspecified glaucoma: Secondary | ICD-10-CM | POA: Diagnosis present

## 2020-07-23 DIAGNOSIS — Z8249 Family history of ischemic heart disease and other diseases of the circulatory system: Secondary | ICD-10-CM | POA: Diagnosis not present

## 2020-07-23 DIAGNOSIS — Z881 Allergy status to other antibiotic agents status: Secondary | ICD-10-CM | POA: Diagnosis not present

## 2020-07-23 DIAGNOSIS — I132 Hypertensive heart and chronic kidney disease with heart failure and with stage 5 chronic kidney disease, or end stage renal disease: Secondary | ICD-10-CM | POA: Diagnosis present

## 2020-07-23 DIAGNOSIS — G9341 Metabolic encephalopathy: Secondary | ICD-10-CM | POA: Diagnosis present

## 2020-07-23 DIAGNOSIS — E11621 Type 2 diabetes mellitus with foot ulcer: Secondary | ICD-10-CM | POA: Diagnosis present

## 2020-07-23 DIAGNOSIS — R4182 Altered mental status, unspecified: Secondary | ICD-10-CM | POA: Diagnosis present

## 2020-07-23 DIAGNOSIS — E8889 Other specified metabolic disorders: Secondary | ICD-10-CM | POA: Diagnosis present

## 2020-07-23 DIAGNOSIS — Z794 Long term (current) use of insulin: Secondary | ICD-10-CM

## 2020-07-23 DIAGNOSIS — E11319 Type 2 diabetes mellitus with unspecified diabetic retinopathy without macular edema: Secondary | ICD-10-CM | POA: Diagnosis present

## 2020-07-23 DIAGNOSIS — Z8049 Family history of malignant neoplasm of other genital organs: Secondary | ICD-10-CM

## 2020-07-23 DIAGNOSIS — R9431 Abnormal electrocardiogram [ECG] [EKG]: Secondary | ICD-10-CM | POA: Diagnosis present

## 2020-07-23 DIAGNOSIS — L97529 Non-pressure chronic ulcer of other part of left foot with unspecified severity: Secondary | ICD-10-CM | POA: Diagnosis present

## 2020-07-23 DIAGNOSIS — G9340 Encephalopathy, unspecified: Secondary | ICD-10-CM | POA: Diagnosis present

## 2020-07-23 DIAGNOSIS — Z79899 Other long term (current) drug therapy: Secondary | ICD-10-CM

## 2020-07-23 DIAGNOSIS — Z7984 Long term (current) use of oral hypoglycemic drugs: Secondary | ICD-10-CM

## 2020-07-23 LAB — CBC
HCT: 32.7 % — ABNORMAL LOW (ref 39.0–52.0)
Hemoglobin: 10.9 g/dL — ABNORMAL LOW (ref 13.0–17.0)
MCH: 32.8 pg (ref 26.0–34.0)
MCHC: 33.3 g/dL (ref 30.0–36.0)
MCV: 98.5 fL (ref 80.0–100.0)
Platelets: 129 10*3/uL — ABNORMAL LOW (ref 150–400)
RBC: 3.32 MIL/uL — ABNORMAL LOW (ref 4.22–5.81)
RDW: 12.5 % (ref 11.5–15.5)
WBC: 7.8 10*3/uL (ref 4.0–10.5)
nRBC: 0 % (ref 0.0–0.2)

## 2020-07-23 LAB — COMPREHENSIVE METABOLIC PANEL
ALT: 15 U/L (ref 0–44)
AST: 39 U/L (ref 15–41)
Albumin: 4 g/dL (ref 3.5–5.0)
Alkaline Phosphatase: 42 U/L (ref 38–126)
Anion gap: 12 (ref 5–15)
BUN: 36 mg/dL — ABNORMAL HIGH (ref 6–20)
CO2: 27 mmol/L (ref 22–32)
Calcium: 8.9 mg/dL (ref 8.9–10.3)
Chloride: 95 mmol/L — ABNORMAL LOW (ref 98–111)
Creatinine, Ser: 6.03 mg/dL — ABNORMAL HIGH (ref 0.61–1.24)
GFR, Estimated: 11 mL/min — ABNORMAL LOW (ref >60)
Glucose, Bld: 139 mg/dL — ABNORMAL HIGH (ref 70–99)
Potassium: 3.5 mmol/L (ref 3.5–5.1)
Sodium: 134 mmol/L — ABNORMAL LOW (ref 135–145)
Total Bilirubin: 1.2 mg/dL (ref 0.3–1.2)
Total Protein: 7.7 g/dL (ref 6.5–8.1)

## 2020-07-23 LAB — CBG MONITORING, ED
Glucose-Capillary: 113 mg/dL — ABNORMAL HIGH (ref 70–99)
Glucose-Capillary: 130 mg/dL — ABNORMAL HIGH (ref 70–99)
Glucose-Capillary: 166 mg/dL — ABNORMAL HIGH (ref 70–99)

## 2020-07-23 LAB — ETHANOL: Alcohol, Ethyl (B): <10 mg/dL (ref <10)

## 2020-07-23 LAB — VALPROIC ACID LEVEL: Valproic Acid Lvl: <10 ug/mL — ABNORMAL LOW (ref 50.0–100.0)

## 2020-07-23 LAB — AMMONIA: Ammonia: 21 umol/L (ref 9–35)

## 2020-07-23 LAB — SARS CORONAVIRUS 2 BY RT PCR (HOSPITAL ORDER, PERFORMED IN ~~LOC~~ HOSPITAL LAB): SARS Coronavirus 2: NEGATIVE

## 2020-07-23 LAB — LIPASE, BLOOD: Lipase: 35 U/L (ref 11–51)

## 2020-07-23 MED ORDER — FENTANYL CITRATE (PF) 100 MCG/2ML IJ SOLN
INTRAMUSCULAR | Status: AC
  Administered 2020-07-23: 100 ug via INTRAVENOUS
  Filled 2020-07-23: qty 2

## 2020-07-23 MED ORDER — CARVEDILOL 12.5 MG PO TABS
25.0000 mg | ORAL_TABLET | Freq: Two times a day (BID) | ORAL | Status: DC
  Administered 2020-07-24 – 2020-07-26 (×5): 25 mg via ORAL
  Filled 2020-07-23 (×5): qty 2

## 2020-07-23 MED ORDER — CLONIDINE HCL 0.2 MG PO TABS
0.2000 mg | ORAL_TABLET | Freq: Two times a day (BID) | ORAL | Status: DC
  Administered 2020-07-24 – 2020-07-26 (×5): 0.2 mg via ORAL
  Filled 2020-07-23 (×5): qty 1

## 2020-07-23 MED ORDER — FENTANYL CITRATE (PF) 100 MCG/2ML IJ SOLN
100.0000 ug | Freq: Once | INTRAMUSCULAR | Status: AC
Start: 2020-07-23 — End: 2020-07-23

## 2020-07-23 MED ORDER — ZIPRASIDONE HCL 60 MG PO CAPS
60.0000 mg | ORAL_CAPSULE | Freq: Two times a day (BID) | ORAL | Status: DC
  Administered 2020-07-24 – 2020-07-26 (×5): 60 mg via ORAL
  Filled 2020-07-23 (×3): qty 1
  Filled 2020-07-23: qty 3
  Filled 2020-07-23 (×3): qty 1

## 2020-07-23 MED ORDER — INSULIN ASPART 100 UNIT/ML ~~LOC~~ SOLN
0.0000 [IU] | SUBCUTANEOUS | Status: DC
  Administered 2020-07-23: 2 [IU] via SUBCUTANEOUS
  Administered 2020-07-24: 1 [IU] via SUBCUTANEOUS
  Administered 2020-07-25 (×3): 2 [IU] via SUBCUTANEOUS
  Administered 2020-07-25: 3 [IU] via SUBCUTANEOUS
  Administered 2020-07-26: 2 [IU] via SUBCUTANEOUS
  Filled 2020-07-23 (×2): qty 1

## 2020-07-23 MED ORDER — ACETAMINOPHEN 325 MG PO TABS: 650.0000 mg | ORAL_TABLET | Freq: Four times a day (QID) | ORAL | Status: DC | PRN

## 2020-07-23 MED ORDER — LEVETIRACETAM IN NACL 500 MG/100ML IV SOLN
500.0000 mg | Freq: Two times a day (BID) | INTRAVENOUS | Status: DC
  Administered 2020-07-23 – 2020-07-25 (×5): 500 mg via INTRAVENOUS
  Filled 2020-07-23 (×5): qty 100

## 2020-07-23 MED ORDER — HYDROCODONE-ACETAMINOPHEN 10-325 MG PO TABS
1.0000 | ORAL_TABLET | Freq: Three times a day (TID) | ORAL | Status: DC | PRN
Start: 2020-07-23 — End: 2020-07-23

## 2020-07-23 MED ORDER — PROMETHAZINE HCL 25 MG/ML IJ SOLN
12.5000 mg | Freq: Once | INTRAMUSCULAR | Status: AC
  Administered 2020-07-23: 12.5 mg via INTRAVENOUS
  Filled 2020-07-23: qty 1

## 2020-07-23 MED ORDER — LORAZEPAM 2 MG/ML IJ SOLN
1.0000 mg | Freq: Once | INTRAMUSCULAR | Status: AC
  Administered 2020-07-23: 1 mg via INTRAVENOUS

## 2020-07-23 MED ORDER — FENTANYL CITRATE (PF) 100 MCG/2ML IJ SOLN
50.0000 ug | Freq: Once | INTRAMUSCULAR | Status: AC
  Administered 2020-07-23: 50 ug via INTRAVENOUS
  Filled 2020-07-23: qty 2

## 2020-07-23 MED ORDER — ACETAMINOPHEN 650 MG RE SUPP: 650.0000 mg | Freq: Four times a day (QID) | RECTAL | Status: DC | PRN

## 2020-07-23 MED ORDER — STERILE WATER FOR INJECTION IJ SOLN
INTRAMUSCULAR | Status: AC
  Administered 2020-07-23: 1.2 mL
  Filled 2020-07-23: qty 10

## 2020-07-23 MED ORDER — FENTANYL CITRATE (PF) 100 MCG/2ML IJ SOLN
100.0000 ug | Freq: Once | INTRAMUSCULAR | Status: AC
Start: 2020-07-23 — End: 2020-07-23
  Administered 2020-07-23: 100 ug via INTRAVENOUS
  Filled 2020-07-23: qty 2

## 2020-07-23 MED ORDER — HEPARIN SODIUM (PORCINE) 5000 UNIT/ML IJ SOLN
5000.0000 [IU] | Freq: Three times a day (TID) | INTRAMUSCULAR | Status: DC
  Administered 2020-07-23 – 2020-07-25 (×5): 5000 [IU] via SUBCUTANEOUS
  Filled 2020-07-23 (×5): qty 1

## 2020-07-23 MED ORDER — LORAZEPAM 2 MG/ML IJ SOLN: 0.5000 mg | Freq: Once | INTRAMUSCULAR | Status: DC

## 2020-07-23 MED ORDER — HYDROCODONE-ACETAMINOPHEN 10-325 MG PO TABS
1.0000 | ORAL_TABLET | Freq: Three times a day (TID) | ORAL | Status: AC | PRN
  Administered 2020-07-23: 1 via ORAL
  Filled 2020-07-23: qty 1

## 2020-07-23 MED ORDER — LORAZEPAM 2 MG/ML IJ SOLN: 1.0000 mg | Freq: Once | INTRAMUSCULAR | Status: DC

## 2020-07-23 MED ORDER — METOCLOPRAMIDE HCL 5 MG/ML IJ SOLN
10.0000 mg | Freq: Once | INTRAMUSCULAR | Status: AC
  Administered 2020-07-23: 10 mg via INTRAVENOUS
  Filled 2020-07-23: qty 2

## 2020-07-23 MED ORDER — LORAZEPAM 2 MG/ML IJ SOLN
1.0000 mg | Freq: Once | INTRAMUSCULAR | Status: AC
  Administered 2020-07-23: 1 mg via INTRAVENOUS
  Filled 2020-07-23: qty 1

## 2020-07-23 MED ORDER — DIVALPROEX SODIUM 250 MG PO DR TAB
250.0000 mg | DELAYED_RELEASE_TABLET | Freq: Every day | ORAL | Status: DC
  Administered 2020-07-23 – 2020-07-26 (×4): 250 mg via ORAL
  Filled 2020-07-23 (×4): qty 1

## 2020-07-23 MED ORDER — ZIPRASIDONE MESYLATE 20 MG IM SOLR
10.0000 mg | Freq: Once | INTRAMUSCULAR | Status: AC
  Administered 2020-07-23: 10 mg via INTRAMUSCULAR
  Filled 2020-07-23: qty 20

## 2020-07-23 MED ORDER — SUCRALFATE 1 G PO TABS
1.0000 g | ORAL_TABLET | Freq: Three times a day (TID) | ORAL | Status: DC
  Administered 2020-07-24 – 2020-07-26 (×9): 1 g via ORAL
  Filled 2020-07-23 (×9): qty 1

## 2020-07-23 MED ORDER — OXYCODONE-ACETAMINOPHEN 5-325 MG PO TABS
1.0000 | ORAL_TABLET | Freq: Once | ORAL | Status: AC
Start: 2020-07-23 — End: 2020-07-23
  Administered 2020-07-23: 1 via ORAL
  Filled 2020-07-23: qty 1

## 2020-07-23 MED ORDER — MORPHINE SULFATE (PF) 2 MG/ML IV SOLN
2.0000 mg | Freq: Once | INTRAVENOUS | Status: AC
Start: 2020-07-23 — End: 2020-07-23
  Administered 2020-07-23: 2 mg via INTRAVENOUS
  Filled 2020-07-23: qty 1

## 2020-07-23 MED ORDER — LABETALOL HCL 5 MG/ML IV SOLN: 10.0000 mg | INTRAVENOUS | Status: DC | PRN

## 2020-07-23 MED ORDER — AMLODIPINE BESYLATE 5 MG PO TABS
5.0000 mg | ORAL_TABLET | Freq: Every day | ORAL | Status: DC
Start: 2020-07-24 — End: 2020-07-26
  Administered 2020-07-24 – 2020-07-26 (×3): 5 mg via ORAL
  Filled 2020-07-23 (×3): qty 1

## 2020-07-23 MED ORDER — POLYETHYLENE GLYCOL 3350 17 G PO PACK: 17.0000 g | PACK | Freq: Every day | ORAL | Status: DC | PRN

## 2020-07-23 NOTE — ED Notes (Signed)
Pt continues to be non compliant. Security tried redirecting pt but not compliant.

## 2020-07-23 NOTE — ED Notes (Signed)
Pt ambulatory around the nurses station with assistance, pt has slow shuffeling gate, pt states that he is trying to go home, pt states that he wants to go home, explained that he needed to talk with his family, pt talking with family, states that he wants to go home, sister states that he needs to behave if he is going to come home, asked him what he would do if he goes home, pt states that he will just lay there and watch tv

## 2020-07-23 NOTE — ED Notes (Signed)
Pt in bed, pt states that he has abd pain and anxiety, states that he doesn't know what will help, but he suspects pain med will help, pain med given.

## 2020-07-23 NOTE — ED Notes (Signed)
Pt threw up and had to be suctioned. Pt puts legs in the air and rocks his body back and forth in the bed as hard as he can.

## 2020-07-23 NOTE — ED Notes (Signed)
Pt trying to climb out of bed, pt uncooperative in trying to return pt to bed, pt able to stand, pt states that he needs more pain med, md notified, pain med given, informed pt that his sister is here, pt states that he wants to go home.

## 2020-07-23 NOTE — ED Triage Notes (Signed)
EMS reports pt was at dialysis and received approx 1 hour of dialysis and became confused and combative.  Dialysis called ems.  EMS arrived, pt alert and cooperative initially but then became combative with ems.  C/O abd pain.  Reports nausea but no vomiting or diarrhea.  EMS gave 2.5mg  versed.  CBG 130.  bp 130/70.  PT alert, answering questions appropriately.  Pt says is trying to get out of bed because his stomach hurts.  Pt says went to [facility] sat for same.

## 2020-07-23 NOTE — ED Notes (Signed)
Pt able to stand without assistance, pt given crackers and water for po challenge, pt ate a cracker and had some water, pt states that he is feeling better.

## 2020-07-23 NOTE — ED Notes (Signed)
Pt  In bed, pt reports 10/10 pain, but states that the fentanyl is helping.

## 2020-07-23 NOTE — ED Notes (Signed)
Pt to and from ct with RN on monitor, pt back in room, pt mildly redirectable.

## 2020-07-23 NOTE — ED Notes (Signed)
Pt trying to get out of bed, pt reports abd pain, pain med given, pt is currently non redirectable, pt continues to pull at soft restraints, dimmed lights to decrease stimulation, redirected pt.  Pt requests something to knock him out.

## 2020-07-23 NOTE — ED Notes (Signed)
Pt in bed, pt vomited a small amount of clear liquid.

## 2020-07-23 NOTE — ED Notes (Signed)
Pt in bed, sitter at bedside, pt continues to appear very anxious requiring assistance back into bed, md notified, meds given

## 2020-07-23 NOTE — Progress Notes (Signed)
Subjective:    Patient ID: Timothy Clark, male    DOB: Jan 01, 1971, 49 y.o.   MRN: [id number] Chief Complaint  Patient presents with  . Follow-up    3 mo U/S follow up    The patient returns to the office for followup of their dialysis access. The function of the access has been stable. The patient denies increased bleeding time or increased recirculation. Patient denies difficulty with cannulation. The patient denies hand pain or other symptoms consistent with steal phenomena.  No significant arm swelling.  The patient denies redness or swelling at the access site. The patient denies fever or chills at home or while on dialysis.  The patient denies amaurosis fugax or recent TIA symptoms. There are no recent neurological changes noted. The patient denies claudication symptoms or rest pain symptoms. The patient denies history of DVT, PE or superficial thrombophlebitis. The patient denies recent episodes of angina or shortness of breath.    The patient's left brachial axillary AV graft has a flow volume of 1123.  It appears to be patent throughout with no areas of significant stenosis.     Review of Systems  Cardiovascular: Negative for leg swelling.  Hematological: Does not bruise/bleed easily.  All other systems reviewed and are negative.      Objective:   Physical Exam Vitals reviewed.  HENT:     Head: Normocephalic.  Cardiovascular:     Rate and Rhythm: Normal rate.     Pulses: Normal pulses.          Radial pulses are 2+ on the left side.     Arteriovenous access: left arteriovenous access is present.    Comments: Left brachial axillary AV graft with good thrill and bruit Pulmonary:     Effort: Pulmonary effort is normal.  Neurological:     Mental Status: He is alert and oriented to person, place, and time.  Psychiatric:        Mood and Affect: Mood normal.        Behavior: Behavior normal.        Thought Content: Thought content normal.        Judgment:  Judgment normal.     BP 98/63   Pulse 69   Ht 6' (1.829 m)   Wt 284 lb (128.8 kg)   BMI 38.52 kg/m   Past Medical History:  Diagnosis Date  . Anemia   . Anxiety   . CHF (congestive heart failure) (HCC)    diastolic dysfunction  . Chronic abdominal pain   . COVID-19 07/2019  . Depression   . Esophagitis   . ESRD on hemodialysis [facility])    undergoing evaluation for transplant  . Eye hemorrhage   . Gastritis   . Gastroparesis   . GERD (gastroesophageal reflux disease)   . Glaucoma   . Helicobacter pylori (H. pylori) infection 2016   ERADICATION DOCUMENTED VIA STOOL TEST in AUG 2016 at [place]  . Hiccups   . Hypertension   . Nausea and vomiting    chronic, recurrent  . Neuropathy    feet  . Renal insufficiency   . Type 2 diabetes mellitus with complications (HCC)    diagnosed around age 65    Social History   Socioeconomic History  . Marital status: Single    Spouse name: Not on file  . Number of children: Not on file  . Years of education: Not on file  . Highest education level: Not on file  Occupational History  .  Not on file  Tobacco Use  . Smoking status: Never Smoker  . Smokeless tobacco: Never Used  Vaping Use  . Vaping Use: Never used  Substance and Sexual Activity  . Alcohol use: No    Alcohol/week: 0.0 standard drinks  . Drug use: No  . Sexual activity: Yes  Other Topics Concern  . Not on file  Social History Narrative   Lives with dad and sister   Social Determinants of Health   Financial Resource Strain: Not on file  Food Insecurity: Not on file  Transportation Needs: Not on file  Physical Activity: Not on file  Stress: Not on file  Social Connections: Not on file  Intimate Partner Violence: Not on file    Past Surgical History:  Procedure Laterality Date  . AMPUTATION TOE Right    great toe  . AV FISTULA INSERTION W/ RF MAGNETIC GUIDANCE Left 04/06/2019   Procedure: AV FISTULA INSERTION W/RF MAGNETIC GUIDANCE;  Surgeon: Katha Cabal, MD;  Location: [facility];  Service: Cardiovascular;  Laterality: Left;  . AV FISTULA PLACEMENT Left 05/06/2019   Procedure: INSERTION OF ARTERIOVENOUS (AV) GORE-TEX GRAFT ARM ( FOREARM LOOP );  Surgeon: Katha Cabal, MD;  Location: [facility];  Service: Vascular;  Laterality: Left;  . AV FISTULA PLACEMENT Left 03/21/2020   Procedure: INSERTION OF ARTERIOVENOUS (AV) GORE-TEX GRAFT ARM ( BRACHIAL AXILLARY );  Surgeon: Katha Cabal, MD;  Location: [facility];  Service: Vascular;  Laterality: Left;  . CATARACT EXTRACTION W/PHACO Left 05/19/2016   Procedure: CATARACT EXTRACTION PHACO AND INTRAOCULAR LENS PLACEMENT LEFT EYE;  Surgeon: Tonny Branch, MD;  Location: [facility];  Service: Ophthalmology;  Laterality: Left;  CDE: 7.30  . CATARACT EXTRACTION W/PHACO Right 06/23/2016   Procedure: CATARACT EXTRACTION PHACO AND INTRAOCULAR LENS PLACEMENT RIGHT EYE CDE=9.87;  Surgeon: Tonny Branch, MD;  Location: [facility];  Service: Ophthalmology;  Laterality: Right;  right  . DIALYSIS/PERMA CATHETER REMOVAL N/A 07/19/2019   Procedure: DIALYSIS/PERMA CATHETER REMOVAL;  Surgeon: Katha Cabal, MD;  Location: [facility];  Service: Cardiovascular;  Laterality: N/A;  . ESOPHAGOGASTRODUODENOSCOPY  2015   Dr. Britta Mccreedy  . ESOPHAGOGASTRODUODENOSCOPY N/A 10/26/2014   RMR: Distal esophagititis-likely reflux related although an element of pill induced injuruy no exclueded  status post biopsy. Diffusely abnormal gastric mucosa of uncertain signigicane -status post gastric biopsy. Focal area of excoriation in the cardia most consistant with a trauma of heaving.   . ESOPHAGOGASTRODUODENOSCOPY  08/2015   [facility]: mild esophagitis and old gastric contents  . EYE SURGERY Left 2015   stent placed to left eye  . EYE SURGERY Right 06/2019   per patient- to remove scar tissue  . INCISION AND DRAINAGE OF WOUND Right 07/16/2017   Procedure: IRRIGATION AND DEBRIDEMENT OF SOFT TISSUE OF ULCERATION RIGHT FOOT;   Surgeon: Caprice Beaver, DPM;  Location: [facility];  Service: Podiatry;  Laterality: Right;  . PERIPHERAL VASCULAR THROMBECTOMY Left 09/27/2019   Procedure: PERIPHERAL VASCULAR THROMBECTOMY;  Surgeon: Katha Cabal, MD;  Location: [place] [facility];  Service: Cardiovascular;  Laterality: Left;  . PERIPHERAL VASCULAR THROMBECTOMY Left 10/27/2019   Procedure: PERIPHERAL VASCULAR THROMBECTOMY;  Surgeon: Algernon Huxley, MD;  Location: [place] [facility];  Service: Cardiovascular;  Laterality: Left;  . PERIPHERAL VASCULAR THROMBECTOMY Left 12/13/2019   Procedure: PERIPHERAL VASCULAR THROMBECTOMY;  Surgeon: Katha Cabal, MD;  Location: [place] [facility];  Service: Cardiovascular;  Laterality: Left;    Family History  Problem Relation Age of Onset  . Ovarian cancer Mother   . Heart attack Father   . Colon polyps Father   . Cervical cancer Sister   . Diabetes Sister   . Colon cancer Neg Hx     Allergies  Allergen Reactions  . Donnatal [Phenobarbital-Belladonna Alk] Anaphylaxis and Other (See Comments)    Reaction to GI cocktail  . Fish Allergy Anaphylaxis, Hives and Rash  . Haldol [Haloperidol Lactate] Other (See Comments)    Chest Pain  . Maalox [Calcium Carbonate Antacid] Anaphylaxis and Other (See Comments)    Reaction to GI cocktail  . Shellfish Allergy Hives    Per patient: Patient stated he breaks out in hives when eating shellfish  . Aspirin Hives and Itching  . Doxycycline Hives, Nausea And Vomiting and Other (See Comments)    Severe   . Keflex [Cephalexin] Hives, Itching and Nausea And Vomiting  . Marinol [Dronabinol] Nausea And Vomiting and Other (See Comments)    Caused worsening vomiting.   . Other Swelling  . Penicillins Hives, Itching, Other (See Comments) and Rash    Has patient had a PCN reaction causing immediate rash, facial/tongue/throat swelling, SOB or lightheadedness with hypotension: yes Has patient had a PCN reaction causing severe rash involving  mucus membranes or skin necrosis: yes Has patient had a PCN reaction that required hospitalization no Has patient had a PCN reaction occurring within the last 10 years: yes If all of the above answers are "NO", then may proceed with Cephalospor  . Bactrim [Sulfamethoxazole-Trimethoprim] Itching    CBC Latest Ref Rng & Units 03/20/2020 03/03/2020 03/02/2020  WBC 4.0 - 10.5 K/uL 6.2 10.3 10.9(H)  Hemoglobin 13.0 - 17.0 g/dL 11.1(L) 13.1 12.9(L)  Hematocrit 39.0 - 52.0 % 33.3(L) 38.2(L) 36.9(L)  Platelets 150 - 400 K/uL 144(L) 131(L) 141(L)      CMP     Component Value Date/Time   NA 133 (L) 03/20/2020 1127   K 4.3 03/21/2020 1139   CL 93 (L) 03/20/2020 1127   CO2 25 03/20/2020 1127   GLUCOSE 237 (H) 03/20/2020 1127   BUN 35 (H) 03/20/2020 1127   CREATININE 5.85 (H) 03/20/2020 1127   CREATININE 1.20 06/22/2017 1436   CALCIUM 8.9 03/20/2020 1127   PROT 8.8 (H) 03/01/2020 2120   ALBUMIN 4.7 03/01/2020 2120   AST 25 03/01/2020 2120   ALT 20 03/01/2020 2120   ALKPHOS 55 03/01/2020 2120   BILITOT 1.1 03/01/2020 2120   GFRNONAA 10 (L) 03/20/2020 1127   GFRAA 12 (L) 03/20/2020 1127     No results found.     Assessment & Plan:   1. ESRD on dialysis [facility]) Recommend:  The patient is doing well and currently has adequate dialysis access. The patient's dialysis center is not reporting any access issues. Flow pattern is stable when compared to the prior ultrasound.  The patient should have a duplex ultrasound of the dialysis access in 6 months. The patient will follow-up with me in the office after each ultrasound     2. Essential hypertension Continue antihypertensive medications as already ordered, these medications have been reviewed and there are no changes at this time.   3. Diabetes mellitus type 2 with complications, uncontrolled ([place]) Continue hypoglycemic medications as already ordered, these medications have been reviewed and there are no changes at this  time.  Hgb A1C to be monitored as already arranged by primary service    Current Outpatient Medications on File Prior to Visit  Medication  Sig Dispense Refill  . ALPRAZolam (XANAX) 0.5 MG tablet Take 0.5 mg by mouth 2 (two) times daily as needed for anxiety.     [name] amLODipine (NORVASC) 5 MG tablet Take 5 mg by mouth daily.    [name] atorvastatin (LIPITOR) 10 MG tablet Take 10 mg by mouth at bedtime.     . brimonidine (ALPHAGAN) 0.15 % ophthalmic solution Place 1 drop into both eyes in the morning and at bedtime.     . carvedilol (COREG) 25 MG tablet Take 25 mg by mouth 2 (two) times daily with a meal.    . cloNIDine (CATAPRES) 0.2 MG tablet Take 0.2 mg by mouth 2 (two) times daily.     . cyanocobalamin (,VITAMIN B-12,) 1000 MCG/ML injection Inject 1,000 mcg into the muscle every 30 (thirty) days.   1  . divalproex (DEPAKOTE) 250 MG DR tablet Take 250 mg by mouth daily.    . dorzolamide-timolol (COSOPT) 22.3-6.8 MG/ML ophthalmic solution Place 1 drop into both eyes 2 (two) times daily.     . folic acid (FOLVITE) 1 MG tablet Take 1 mg by mouth daily.    [name] LEADER UNIFINE PENTIPS PLUS 32G X 4 MM MISC     . levETIRAcetam (KEPPRA) 500 MG tablet     . metoCLOPramide (REGLAN) 5 MG tablet Take 1 tablet (5 mg total) by mouth every 12 (twelve) hours. 60 tablet 1  . NOVOLOG MIX 70/30 FLEXPEN (70-30) 100 UNIT/ML FlexPen 35-55 Units by Subconjunctival route See admin instructions. Inject 55 units subcutaneously in the morning & inject 35 units subcutaneously in the evening.    . ondansetron (ZOFRAN) 4 MG tablet Take 4 mg by mouth every 8 (eight) hours as needed for nausea or vomiting.    . pantoprazole (PROTONIX) 40 MG tablet Take 1 tablet (40 mg total) by mouth 2 (two) times daily before a meal. 180 tablet 3  . sertraline (ZOLOFT) 100 MG tablet Take 100 mg by mouth daily.    . sitaGLIPtin (JANUVIA) 25 MG tablet Take 25 mg by mouth daily.     . sucralfate (CARAFATE) 1 g tablet Take 1 g by mouth in the morning,  at noon, and at bedtime.     . torsemide (DEMADEX) 20 MG tablet Take 40 mg by mouth daily.    . ziprasidone (GEODON) 60 MG capsule Take 60 mg by mouth 2 (two) times daily with a meal.     No current facility-administered medications on file prior to visit.    There are no Patient Instructions on file for this visit. No follow-ups on file.   Kris Hartmann, NP

## 2020-07-23 NOTE — H&P (Addendum)
History and Physical    Timothy Clark QMG:[id number] DOB: [birth year] DOA: 07/23/2020  PCP: [name], MD   Patient coming from: Home  I have personally briefly reviewed patient's old medical records in [place]  Chief Complaint: Abdominal pain, confusion.  HPI: Timothy Clark is a 49 y.o. male with medical history significant for ESRD, hypertension, diabetes mellitus, CHF, gastroparesis, dumping syndrome. Patient presented to the ED with complaints of abdominal pain that started this morning.  Patient went for his dialysis, when he started having abdominal pain with vomiting, confused and combative.  Patient had 2 Hrs left to go on his dialysis, when it was stopped and he was sent to ED via EMS.  Per EMS patient was initially cooperative then became combative. On my evaluation patient is calm, able to answer questions, sister is present at bedside and assist with the history.  She reports this happens preoperatively patient has abdominal pain, this makes him confused and combative.  She reports patient was started on Depakote a few months ago and since then he has not had recurrence of the symptoms.  As been compliant with his Depakote, but he has been vomiting the past few days.   Patient was on the 11th for the same thing, he had a head CT done which was unremarkable.  But no abdominal imaging was done.  Patient denies NSAID use.  Bowel movement was yesterday, and was normal without diarrhea or constipation.  He still makes a small amount of urine. Patient and sister deny any alcohol intake or drug use.  ED Course: T-max 99.5, heart rate 80s to 90s, blood pressure systolic 326Z to 124P, O2 sats greater than 96% on room air.  35.  Unremarkable CBC.  Sodium 134, otherwise unremarkable CMP.  Level less than 10.  Ammonia level- 21.  Hospitalist to admit for further evaluation and management.  Review of Systems: As per HPI all other systems reviewed and negative.  Past Medical  History:  Diagnosis Date  . Anemia   . Anxiety   . CHF (congestive heart failure) (HCC)    diastolic dysfunction  . Chronic abdominal pain   . COVID-19 07/2019  . Depression   . Esophagitis   . ESRD on hemodialysis [facility])    undergoing evaluation for transplant  . Eye hemorrhage   . Gastritis   . Gastroparesis   . GERD (gastroesophageal reflux disease)   . Glaucoma   . Helicobacter pylori (H. pylori) infection 2016   ERADICATION DOCUMENTED VIA STOOL TEST in AUG 2016 at [place]  . Hiccups   . Hypertension   . Nausea and vomiting    chronic, recurrent  . Neuropathy    feet  . Renal insufficiency   . Type 2 diabetes mellitus with complications (HCC)    diagnosed around age 12    Past Surgical History:  Procedure Laterality Date  . AMPUTATION TOE Right    great toe  . AV FISTULA INSERTION W/ RF MAGNETIC GUIDANCE Left 04/06/2019   Procedure: AV FISTULA INSERTION W/RF MAGNETIC GUIDANCE;  Surgeon: Katha Cabal, MD;  Location: [facility];  Service: Cardiovascular;  Laterality: Left;  . AV FISTULA PLACEMENT Left 05/06/2019   Procedure: INSERTION OF ARTERIOVENOUS (AV) GORE-TEX GRAFT ARM ( FOREARM LOOP );  Surgeon: Katha Cabal, MD;  Location: [facility];  Service: Vascular;  Laterality: Left;  . AV FISTULA PLACEMENT Left 03/21/2020   Procedure: INSERTION OF ARTERIOVENOUS (AV) GORE-TEX GRAFT ARM ( BRACHIAL  AXILLARY );  Surgeon: Katha Cabal, MD;  Location: [facility];  Service: Vascular;  Laterality: Left;  . CATARACT EXTRACTION W/PHACO Left 05/19/2016   Procedure: CATARACT EXTRACTION PHACO AND INTRAOCULAR LENS PLACEMENT LEFT EYE;  Surgeon: Tonny Branch, MD;  Location: [facility];  Service: Ophthalmology;  Laterality: Left;  CDE: 7.30  . CATARACT EXTRACTION W/PHACO Right 06/23/2016   Procedure: CATARACT EXTRACTION PHACO AND INTRAOCULAR LENS PLACEMENT RIGHT EYE CDE=9.87;  Surgeon: Tonny Branch, MD;  Location: [facility];  Service: Ophthalmology;  Laterality: Right;  right  .  DIALYSIS/PERMA CATHETER REMOVAL N/A 07/19/2019   Procedure: DIALYSIS/PERMA CATHETER REMOVAL;  Surgeon: Katha Cabal, MD;  Location: [facility] [facility];  Service: Cardiovascular;  Laterality: N/A;  . ESOPHAGOGASTRODUODENOSCOPY  2015   Dr. Britta Mccreedy  . ESOPHAGOGASTRODUODENOSCOPY N/A 10/26/2014   RMR: Distal esophagititis-likely reflux related although an element of pill induced injuruy no exclueded  status post biopsy. Diffusely abnormal gastric mucosa of uncertain signigicane -status post gastric biopsy. Focal area of excoriation in the cardia most consistant with a trauma of heaving.   . ESOPHAGOGASTRODUODENOSCOPY  08/2015   [facility]: mild esophagitis and old gastric contents  . EYE SURGERY Left 2015   stent placed to left eye  . EYE SURGERY Right 06/2019   per patient- to remove scar tissue  . INCISION AND DRAINAGE OF WOUND Right 07/16/2017   Procedure: IRRIGATION AND DEBRIDEMENT OF SOFT TISSUE OF ULCERATION RIGHT FOOT;  Surgeon: Caprice Beaver, DPM;  Location: [facility];  Service: Podiatry;  Laterality: Right;  . PERIPHERAL VASCULAR THROMBECTOMY Left 09/27/2019   Procedure: PERIPHERAL VASCULAR THROMBECTOMY;  Surgeon: Katha Cabal, MD;  Location: [place];  Service: Cardiovascular;  Laterality: Left;  . PERIPHERAL VASCULAR THROMBECTOMY Left 10/27/2019   Procedure: PERIPHERAL VASCULAR THROMBECTOMY;  Surgeon: Algernon Huxley, MD;  Location: [place] [facility];  Service: Cardiovascular;  Laterality: Left;  . PERIPHERAL VASCULAR THROMBECTOMY Left 12/13/2019   Procedure: PERIPHERAL VASCULAR THROMBECTOMY;  Surgeon: Katha Cabal, MD;  Location: [place] [facility];  Service: Cardiovascular;  Laterality: Left;     reports that he has never smoked. He has never used smokeless tobacco. He reports that he does not drink alcohol and does not use drugs.  Allergies  Allergen Reactions  . Donnatal [Phenobarbital-Belladonna Alk] Anaphylaxis and Other (See Comments)    Reaction to GI  cocktail  . Fish Allergy Anaphylaxis, Hives and Rash  . Haldol [Haloperidol Lactate] Other (See Comments)    Chest Pain  . Lidocaine Anaphylaxis and Other (See Comments)    Reaction to GI cocktail  . Maalox [Calcium Carbonate Antacid] Anaphylaxis and Other (See Comments)    Reaction to GI cocktail  . Shellfish Allergy Hives    Per patient: Patient stated he breaks out in hives when eating shellfish  . Aspirin Hives and Itching  . Doxycycline Hives, Nausea And Vomiting and Other (See Comments)    Severe   . Keflex [Cephalexin] Hives, Itching and Nausea And Vomiting  . Marinol [Dronabinol] Nausea And Vomiting and Other (See Comments)    Caused worsening vomiting.   . Other Swelling  . Penicillins Hives, Itching, Other (See Comments) and Rash    Has patient had a PCN reaction causing immediate rash, facial/tongue/throat swelling, SOB or lightheadedness with hypotension: yes Has patient had a PCN reaction causing severe rash involving mucus membranes or skin necrosis: yes Has patient had a PCN reaction that required hospitalization no Has patient had a PCN reaction occurring within the  last 10 years: yes If all of the above answers are "NO", then may proceed with Cephalospor  . Bactrim [Sulfamethoxazole-Trimethoprim] Itching    Family History  Problem Relation Age of Onset  . Ovarian cancer Mother   . Heart attack Father   . Colon polyps Father   . Cervical cancer Sister   . Diabetes Sister   . Colon cancer Neg Hx     Prior to Admission medications   Medication Sig Start Date End Date Taking? Authorizing Provider  ALPRAZolam Duanne Moron) 0.5 MG tablet Take 0.5 mg by mouth 2 (two) times daily as needed for anxiety.     [provider]  amLODipine (NORVASC) 5 MG tablet Take 5 mg by mouth daily.    [provider]  atorvastatin (LIPITOR) 10 MG tablet Take 10 mg by mouth at bedtime.     [provider]  brimonidine (ALPHAGAN) 0.15 % ophthalmic solution Place 1  drop into both eyes in the morning and at bedtime.     [provider]  carvedilol (COREG) 25 MG tablet Take 25 mg by mouth 2 (two) times daily with a meal.    [provider]  cloNIDine (CATAPRES) 0.2 MG tablet Take 0.2 mg by mouth 2 (two) times daily.     [provider]  cyanocobalamin (,VITAMIN B-12,) 1000 MCG/ML injection Inject 1,000 mcg into the muscle every 30 (thirty) days.  04/19/18   [provider]  divalproex (DEPAKOTE) 250 MG DR tablet Take 250 mg by mouth daily.    [provider]  dorzolamide-timolol (COSOPT) 22.3-6.8 MG/ML ophthalmic solution Place 1 drop into both eyes 2 (two) times daily.  01/16/20   [provider]  folic acid (FOLVITE) 1 MG tablet Take 1 mg by mouth daily.    [provider]  LEADER UNIFINE PENTIPS PLUS 32G X 4 MM MISC  05/17/20   [provider]  levETIRAcetam (KEPPRA) 500 MG tablet  07/08/20   [provider]  metoCLOPramide (REGLAN) 5 MG tablet Take 1 tablet (5 mg total) by mouth every 12 (twelve) hours. 03/03/20   Barton Dubois, MD  NOVOLOG MIX 70/30 FLEXPEN (70-30) 100 UNIT/ML FlexPen 35-55 Units by Subconjunctival route See admin instructions. Inject 55 units subcutaneously in the morning & inject 35 units subcutaneously in the evening. 06/21/19   [provider]  ondansetron (ZOFRAN) 4 MG tablet Take 4 mg by mouth every 8 (eight) hours as needed for nausea or vomiting.    [provider]  pantoprazole (PROTONIX) 40 MG tablet Take 1 tablet (40 mg total) by mouth 2 (two) times daily before a meal. 08/24/18   Annitta Needs, NP  sertraline (ZOLOFT) 100 MG tablet Take 100 mg by mouth daily. 02/10/20   [provider]  sitaGLIPtin (JANUVIA) 25 MG tablet Take 25 mg by mouth daily.     [provider]  sucralfate (CARAFATE) 1 g tablet Take 1 g by mouth in the morning, at noon, and at bedtime.     [provider]  torsemide (DEMADEX) 20 MG tablet  Take 40 mg by mouth daily.    [provider]  ziprasidone (GEODON) 60 MG capsule Take 60 mg by mouth 2 (two) times daily with a meal.    [provider]    Physical Exam: Vitals:   07/23/20 1345 07/23/20 1501 07/23/20 1700 07/23/20 1704  BP: (!) 143/90 (!) 176/98 (!) 190/94 (!) 190/94  Pulse: 94 88 (!) 102 95  Resp: 16 18  18  Temp: 97.9 F (36.6 C) 99.5 F (37.5 C)  98.2 F (36.8 C)  TempSrc: Oral Oral    SpO2: 100% 96% 100% 100%  Weight:      Height:        Constitutional: NAD, calm, comfortable Vitals:   07/23/20 1345 07/23/20 1501 07/23/20 1700 07/23/20 1704  BP: (!) 143/90 (!) 176/98 (!) 190/94 (!) 190/94  Pulse: 94 88 (!) 102 95  Resp: _0 Temp: 97.9 F (36.6 C) 99.5 F (37.5 C)  98.2 F (36.8 C)  TempSrc: Oral Oral    SpO2: 100% 96% 100% 100%  Weight:      Height:       Eyes: PERRL, lids and conjunctivae normal ENMT: Mucous membranes are moist.  Neck: normal, supple, no masses, no thyromegaly Respiratory: clear to auscultation bilaterally, no wheezing, no crackles. Normal respiratory effort. No accessory muscle use.  Cardiovascular: Regular rate and rhythm, no murmurs / rubs / gallops. No extremity edema. 2+ pedal pulses.  Abdomen: no tenderness, no masses palpated. No hepatosplenomegaly. Bowel sounds positive.  Musculoskeletal: no clubbing / cyanosis. No joint deformity upper and lower extremities. Good ROM, no contractures. Normal muscle tone.  Skin: Very dry, Ichthyosis appearing skin with significant scaling and flaking affecting mostly bilateral lower extremities. ~3cm wide scabbed over chronic ulcer to medial aspect of big toe, does not appear infected, no surrounding tenderness Neurologic: No apparent cranial abnormality, moving extremities spontaneously. Psychiatric: At the time of my evaluation, normal judgment and insight.  Calm, alert and oriented x 3. Normal mood.   Labs on Admission: I have personally reviewed following labs  and imaging studies  CBC: Recent Labs  Lab 07/23/20 1037  WBC 7.8  HGB 10.9*  HCT 32.7*  MCV 98.5  PLT 321*   Basic Metabolic Panel: Recent Labs  Lab 07/23/20 1037  NA 134*  K 3.5  CL 95*  CO2 27  GLUCOSE 139*  BUN 36*  CREATININE 6.03*  CALCIUM 8.9   Liver Function Tests: Recent Labs  Lab 07/23/20 1037  AST 39  ALT 15  ALKPHOS 42  BILITOT 1.2  PROT 7.7  ALBUMIN 4.0   Recent Labs  Lab 07/23/20 1037  LIPASE 35   Recent Labs  Lab 07/23/20 1515  AMMONIA 21   CBG: Recent Labs  Lab 07/23/20 1026  GLUCAP 130*    Radiological Exams on Admission: No results found.  EKG: Independently reviewed.  Too many artifacts, will repeat.  Assessment/Plan Principal Problem:   Intractable abdominal pain Active Problems:   Diabetes mellitus type 2 with complications, uncontrolled ([place])   Essential hypertension   Gastroparesis due to DM ([place])   ESRD on dialysis ([place])  Intractable abdominal pain-with vomiting.  No diarrhea.  Likely due to gastroparesis, he also has history of chronic abdominal pain, esophagitis, gastritis. Normal Lipase- 35. No obvious GI blood loss, denies NSAID use.  Hemoglobin  10.9, baseline 11- 14. -Hydrocodone acetaminophen 10/325 x 1, limiting narcotics due to possible gastroparesis,  -Monitor CBC -Abdominal CT without contrast- pending. -Resume Protonix IV 40 daily, continue sucralfate -Bowel rest with clear liquid diet  Metabolic encephalopathy- occurs only when patient is having abdominal flareups, also likely related to his bipolar disorder considering improvement with Depakote.  Depakote level < 10, he reports compliance, but he has been vomiting.  Head CT done 2 days ago for same presentation, 12/11 at [facility] without acute abnormality.  Required Precedex and symptoms resolved after HD during last  hospitalization 02/2020. -Resume Depakote -No improvement in agitation and combativeness with Geodon and Ativan 24m x 2 mg.  Will use  soft restraint for now. - UDS - Blo0d alcohol level  Prolonged QTc-536.  Repeat EKG.  Patient is on antipsychotics. - IV phernegan 12.5 mg x 1  ESRD-did not complete session today, appears euvolemic, with potassium 3.5, but hypertensive.  Required HD to improve mentation as with last hospitalization. - HD schedule Monday Wednesday Friday.  Hypertension-elevated.   - PRN IV labetalol 10  -Resume Norvasc, Coreg, clonidine.  Diabetes mellitus type II-random glucose 139 - SSI- S -Hold home 7030, Januvia  Chronic Left big toe ulcer- non tender, does not appear infected.  Follows with podiatry as and has wound care nurse. -Wound care consult   legally blind, diabetic retinopathy  Seizure hx-reports seizures started 2 to 3 months ago. - Seizure precautions. -Resume Depakote, Keppra   DVT prophylaxis: Heparin Code Status: Full code Family Communication: Sister at bedside  disposition Plan: ~ 1-2 days Consults called: None. Admission status:  Obs, Tele   EBethena RoysMD [facility]  07/23/2020, 9:03 PM

## 2020-07-23 NOTE — ED Provider Notes (Addendum)
[facility] EMERGENCY DEPARTMENT Provider Note   CSN: [id number] Arrival date & time: 07/23/20  1017     History Chief Complaint  Patient presents with  . Altered Mental Status    Timothy Clark is a 49 y.o. male.  HPI Patient presents with mental status change.  Reportedly sent in from dialysis.  Reportedly had done most of dialysis and became somewhat confused and combative.  Reviewing records recently seen at [facility] ER for the same.  At that point thought to be behavioral due to abdominal pain.  Patient states his belly is hurting.  No fevers.  States the pain is in his upper abdomen and is his gastroparesis.  Denies headache.  States he is done most with dialysis today.    Past Medical History:  Diagnosis Date  . Anemia   . Anxiety   . CHF (congestive heart failure) (HCC)    diastolic dysfunction  . Chronic abdominal pain   . COVID-19 07/2019  . Depression   . Esophagitis   . ESRD on hemodialysis [facility])    undergoing evaluation for transplant  . Eye hemorrhage   . Gastritis   . Gastroparesis   . GERD (gastroesophageal reflux disease)   . Glaucoma   . Helicobacter pylori (H. pylori) infection 2016   ERADICATION DOCUMENTED VIA STOOL TEST in AUG 2016 at [place]  . Hiccups   . Hypertension   . Nausea and vomiting    chronic, recurrent  . Neuropathy    feet  . Renal insufficiency   . Type 2 diabetes mellitus with complications (HCC)    diagnosed around age 62    Patient Active Problem List   Diagnosis Date Noted  . Colon cancer screening 07/12/2020  . 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 Stable treated proliferative diabetic retinopathy of right eye determined by examination associated with type 2 diabetes mellitus ([place]) 07/03/2020  . Anxiety 03/13/2020  . History of COVID-19 03/13/2020  . History of gastroesophageal reflux (GERD) 03/13/2020  . History of Helicobacter pylori infection 03/13/2020  . Pre-transplant evaluation for kidney transplant  03/13/2020  . Renovascular hypertension 03/13/2020  . Hematemesis 03/02/2020  . Coffee ground emesis 03/01/2020  . Complication of vascular access for dialysis 01/29/2020  . Follow-up examination after eye surgery 12/15/2019  . Type 2 diabetes with nephropathy ([place]) 12/15/2019  . Proliferative diabetic retinopathy of left eye with macular edema associated with type 2 diabetes mellitus ([place]) 12/15/2019  . Vitreous hemorrhage of left eye ([place]) 12/15/2019  . Secondary glaucoma due to combination mechanisms, left, severe stage 12/15/2019  . Nausea with vomiting 12/08/2019  . Hyperglycemia due to type 2 diabetes mellitus ([place]) 10/28/2019  . DKA (diabetic ketoacidoses) 10/27/2019  . ESRD on dialysis ([place]) 06/26/2019  . Constipation 03/15/2019  . Chronic renal insufficiency, stage V ([place]) 02/21/2019  . Type 2 diabetes mellitus with proliferative retinopathy ([place]) 10/25/2018  . Acute renal failure superimposed on stage 4 chronic kidney disease ([place]) 10/25/2018  . Extravasation injury of IV catheter site with other complication, initial encounter ([place])   . Intractable vomiting   . Chronic kidney disease   . Poor venous access   . Gastroparesis 10/24/2018  . Closed displaced fracture of lateral end of right clavicle 06/18/2018  . HTN (hypertension), malignant 06/13/2018  . Scrotal swelling 11/10/2017  . Swelling 11/09/2017  . Scrotal edema   . Obesity, Class III, BMI 40-49.9 (morbid obesity) ([place]) 11/02/2017  . CKD stage 3 due to type 2 diabetes mellitus (  [place]) 10/30/2017  . Wound eschars of foot, right  10/30/2017  . Chronic abdominal pain 09/21/2017  . Hypertensive urgency 09/21/2017  . Prolonged QT interval 09/21/2017  . GERD (gastroesophageal reflux disease) 07/14/2017  . Glaucoma 07/14/2017  . Anemia 07/14/2017  . Pressure injury of skin 02/01/2017  . Insulin dependent diabetes mellitus 09/07/2016  . Orthostatic syncope 09/05/2016  . 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 Gastroparesis due  to DM ([place]) 02/06/2015  . Essential hypertension 12/06/2014  . Diabetic macular edema ([place]) 02/18/2014  . PDR (proliferative diabetic retinopathy) ([place]) 02/06/2014  . Neovascular glaucoma of left eye 01/11/2014  . Diabetic infection of left heel ([place]) 02/14/2010  . UNSPECIFIED PERIPHERAL VASCULAR DISEASE 10/19/2009  . ERECTILE DYSFUNCTION, ORGANIC 10/19/2009  . NUMBNESS 07/20/2009  . Diabetes mellitus type 2 with complications, uncontrolled ([place]) 03/22/2009  . Hyperlipidemia 03/22/2009  . Depression 03/22/2009    Past Surgical History:  Procedure Laterality Date  . AMPUTATION TOE Right    great toe  . AV FISTULA INSERTION W/ RF MAGNETIC GUIDANCE Left 04/06/2019   Procedure: AV FISTULA INSERTION W/RF MAGNETIC GUIDANCE;  Surgeon: Katha Cabal, MD;  Location: [place] [facility];  Service: Cardiovascular;  Laterality: Left;  . AV FISTULA PLACEMENT Left 05/06/2019   Procedure: INSERTION OF ARTERIOVENOUS (AV) GORE-TEX GRAFT ARM ( FOREARM LOOP );  Surgeon: Katha Cabal, MD;  Location: [facility] ORS;  Service: Vascular;  Laterality: Left;  . AV FISTULA PLACEMENT Left 03/21/2020   Procedure: INSERTION OF ARTERIOVENOUS (AV) GORE-TEX GRAFT ARM ( BRACHIAL AXILLARY );  Surgeon: Katha Cabal, MD;  Location: [facility] ORS;  Service: Vascular;  Laterality: Left;  . CATARACT EXTRACTION W/PHACO Left 05/19/2016   Procedure: CATARACT EXTRACTION PHACO AND INTRAOCULAR LENS PLACEMENT LEFT EYE;  Surgeon: Tonny Branch, MD;  Location: [facility];  Service: Ophthalmology;  Laterality: Left;  CDE: 7.30  . CATARACT EXTRACTION W/PHACO Right 06/23/2016   Procedure: CATARACT EXTRACTION PHACO AND INTRAOCULAR LENS PLACEMENT RIGHT EYE CDE=9.87;  Surgeon: Tonny Branch, MD;  Location: [facility];  Service: Ophthalmology;  Laterality: Right;  right  . DIALYSIS/PERMA CATHETER REMOVAL N/A 07/19/2019   Procedure: DIALYSIS/PERMA CATHETER REMOVAL;  Surgeon: Katha Cabal, MD;  Location: [place] [facility];  Service:  Cardiovascular;  Laterality: N/A;  . ESOPHAGOGASTRODUODENOSCOPY  2015   Dr. Britta Mccreedy  . ESOPHAGOGASTRODUODENOSCOPY N/A 10/26/2014   RMR: Distal esophagititis-likely reflux related although an element of pill induced injuruy no exclueded  status post biopsy. Diffusely abnormal gastric mucosa of uncertain signigicane -status post gastric biopsy. Focal area of excoriation in the cardia most consistant with a trauma of heaving.   . ESOPHAGOGASTRODUODENOSCOPY  08/2015   [facility]: mild esophagitis and old gastric contents  . EYE SURGERY Left 2015   stent placed to left eye  . EYE SURGERY Right 06/2019   per patient- to remove scar tissue  . INCISION AND DRAINAGE OF WOUND Right 07/16/2017   Procedure: IRRIGATION AND DEBRIDEMENT OF SOFT TISSUE OF ULCERATION RIGHT FOOT;  Surgeon: Caprice Beaver, DPM;  Location: [facility];  Service: Podiatry;  Laterality: Right;  . PERIPHERAL VASCULAR THROMBECTOMY Left 09/27/2019   Procedure: PERIPHERAL VASCULAR THROMBECTOMY;  Surgeon: Katha Cabal, MD;  Location: [place] [facility];  Service: Cardiovascular;  Laterality: Left;  . PERIPHERAL VASCULAR THROMBECTOMY Left 10/27/2019   Procedure: PERIPHERAL VASCULAR THROMBECTOMY;  Surgeon: Algernon Huxley, MD;  Location: [facility];  Service: Cardiovascular;  Laterality: Left;  . PERIPHERAL VASCULAR THROMBECTOMY Left 12/13/2019   Procedure: PERIPHERAL VASCULAR THROMBECTOMY;  Surgeon: Katha Cabal, MD;  Location: [facility];  Service: Cardiovascular;  Laterality: Left;       Family History  Problem Relation Age of Onset  . Ovarian cancer Mother   . Heart attack Father   . Colon polyps Father   . Cervical cancer Sister   . Diabetes Sister   . Colon cancer Neg Hx     Social History   Tobacco Use  . Smoking status: Never Smoker  . Smokeless tobacco: Never Used  Vaping Use  . Vaping Use: Never used  Substance Use Topics  . Alcohol use: No    Alcohol/week: 0.0 standard drinks  . Drug use:  No    Home Medications Prior to Admission medications   Medication Sig Start Date End Date Taking? Authorizing Provider  ALPRAZolam Duanne Moron) 0.5 MG tablet Take 0.5 mg by mouth 2 (two) times daily as needed for anxiety.     [provider]  amLODipine (NORVASC) 5 MG tablet Take 5 mg by mouth daily.    [provider]  atorvastatin (LIPITOR) 10 MG tablet Take 10 mg by mouth at bedtime.     [provider]  brimonidine (ALPHAGAN) 0.15 % ophthalmic solution Place 1 drop into both eyes in the morning and at bedtime.     [provider]  carvedilol (COREG) 25 MG tablet Take 25 mg by mouth 2 (two) times daily with a meal.    [provider]  cloNIDine (CATAPRES) 0.2 MG tablet Take 0.2 mg by mouth 2 (two) times daily.     [provider]  cyanocobalamin (,VITAMIN B-12,) 1000 MCG/ML injection Inject 1,000 mcg into the muscle every 30 (thirty) days.  04/19/18   [provider]  divalproex (DEPAKOTE) 250 MG DR tablet Take 250 mg by mouth daily.    [provider]  dorzolamide-timolol (COSOPT) 22.3-6.8 MG/ML ophthalmic solution Place 1 drop into both eyes 2 (two) times daily.  01/16/20   [provider]  folic acid (FOLVITE) 1 MG tablet Take 1 mg by mouth daily.    [provider]  LEADER UNIFINE PENTIPS PLUS 32G X 4 MM MISC  05/17/20   [provider]  levETIRAcetam (KEPPRA) 500 MG tablet  07/08/20   [provider]  metoCLOPramide (REGLAN) 5 MG tablet Take 1 tablet (5 mg total) by mouth every 12 (twelve) hours. 03/03/20   Barton Dubois, MD  NOVOLOG MIX 70/30 FLEXPEN (70-30) 100 UNIT/ML FlexPen 35-55 Units by Subconjunctival route See admin instructions. Inject 55 units subcutaneously in the morning & inject 35 units subcutaneously in the evening. 06/21/19   [provider]  ondansetron (ZOFRAN) 4 MG tablet Take 4 mg by mouth every 8 (eight) hours as needed for nausea or vomiting.    [provider]  pantoprazole (PROTONIX) 40 MG tablet Take 1 tablet (40 mg total) by mouth 2 (two) times daily before a meal. 08/24/18   Annitta Needs, NP  sertraline (ZOLOFT) 100 MG tablet Take 100 mg by mouth daily. 02/10/20   [provider]  sitaGLIPtin (JANUVIA) 25 MG tablet Take 25 mg by mouth daily.     [provider]  sucralfate (CARAFATE) 1 g tablet Take 1 g by mouth in the morning, at noon, and at bedtime.     [provider]  torsemide (DEMADEX) 20 MG tablet Take 40 mg by mouth daily.    [provider]  ziprasidone (GEODON) 60 MG capsule Take 60 mg by  mouth 2 (two) times daily with a meal.    [provider]    Allergies    Donnatal [phenobarbital-belladonna alk], Fish allergy, Haldol [haloperidol lactate], Lidocaine, Maalox [calcium carbonate antacid], Shellfish allergy, Aspirin, Doxycycline, Keflex [cephalexin], Marinol [dronabinol], Other, Penicillins, and Bactrim [sulfamethoxazole-trimethoprim]  Review of Systems   Review of Systems  Constitutional: Positive for appetite change.  HENT: Negative for congestion.   Respiratory: Negative for shortness of breath.   Gastrointestinal: Positive for abdominal pain, nausea and vomiting.  Genitourinary: Negative for flank pain.  Musculoskeletal: Negative for back pain.  Skin: Negative for rash.  Neurological: Negative for weakness.  Psychiatric/Behavioral: Negative for confusion.    Physical Exam Updated Vital Signs BP (!) 143/90 (BP Location: Right Arm)   Pulse 94   Temp 97.9 F (36.6 C) (Oral)   Resp 16   Ht 6' (1.829 m)   Wt 129.3 kg   SpO2 100%   BMI 38.65 kg/m   Physical Exam Vitals and nursing note reviewed.  HENT:     Head: Atraumatic.  Eyes:     Pupils: Pupils are equal, round, and reactive to light.  Cardiovascular:     Rate and Rhythm: Regular rhythm.  Pulmonary:     Breath sounds: No wheezing or rhonchi.  Abdominal:     Tenderness: There is abdominal  tenderness.     Comments: Mild upper abdominal tenderness without rebound or guarding.  Musculoskeletal:        General: No tenderness.     Cervical back: Neck supple.  Skin:    General: Skin is warm.     Capillary Refill: Capillary refill takes less than 2 seconds.  Neurological:     Mental Status: He is alert.     Comments: Sitting in bed with eyes closed, but answers questions.  Moves all extremities.     ED Results / Procedures / Treatments   Labs (all labs ordered are listed, but only abnormal results are displayed) Labs Reviewed  COMPREHENSIVE METABOLIC PANEL - Abnormal; Notable for the following components:      Result Value   Sodium 134 (*)    Chloride 95 (*)    Glucose, Bld 139 (*)    BUN 36 (*)    Creatinine, Ser 6.03 (*)    GFR, Estimated 11 (*)    All other components within normal limits  CBC - Abnormal; Notable for the following components:   RBC 3.32 (*)    Hemoglobin 10.9 (*)    HCT 32.7 (*)    Platelets 129 (*)    All other components within normal limits  CBG MONITORING, ED - Abnormal; Notable for the following components:   Glucose-Capillary 130 (*)    All other components within normal limits  LIPASE, BLOOD  VALPROIC ACID LEVEL  AMMONIA    EKG EKG Interpretation  Date/Time:  Monday July 23 2020 10:30:10 EST Ventricular Rate:  85 PR Interval:    QRS Duration: 101 QT Interval:  456 QTC Calculation: 536 R Axis:   33 Text Interpretation: Sinus rhythm Short PR interval Biatrial enlargement Low voltage, extremity and precordial leads Anteroseptal infarct, old Confirmed by Davonna Belling [phone]) on 07/23/2020 1:09:48 PM   Radiology No results found.  Procedures Procedures (including critical care time)  Medications Ordered in ED Medications  fentaNYL (SUBLIMAZE) injection 100 mcg (has no administration in time range)  metoCLOPramide (REGLAN) injection 10 mg (10 mg Intravenous Given 07/23/20 1106)  LORazepam (ATIVAN) injection 1 mg (1  mg Intravenous Given  07/23/20 1105)  LORazepam (ATIVAN) injection 1 mg (1 mg Intravenous Given 07/23/20 1118)  fentaNYL (SUBLIMAZE) injection 100 mcg (100 mcg Intravenous Given 07/23/20 1204)  oxyCODONE-acetaminophen (PERCOCET/ROXICET) 5-325 MG per tablet 1 tablet (1 tablet Oral Given 07/23/20 1317)    ED Course  I have reviewed the triage vital signs and the nursing notes.  Pertinent labs & imaging results that were available during my care of the patient were reviewed by me and considered in my medical decision making (see chart for details).    MDM Rules/Calculators/A&P                         Patient brought in for mental status change.  Reportedly was at dialysis became more combative.  Has been complaining of abdominal pain.  However here complaining abdominal pain but also uncooperative.  States belly is feeling better.  However still appears somewhat confused.  Continues to want to sit up and attempt to move around.  Lab work reassuring.  Head CT reviewed from 2 days ago and reassuring.  Lab work from 2 days ago reviewed.  Patient's medicines from 2 days ago reviewed and did not include Depakote but reviewing GI notes appears may be on Depakote.  Will now add Depakote level and ammonia.  However with continued mental status changes/encephalopathy will admit to hospitalist.  Appears may have seizures history but does not appear to have had recent seizure.  Will discuss with hospitalist.  Patient's sister is now here.  States that when his abdomen flares up he tends to get more confused.  States normally needs a day or 2 in the hospital to help improve.  States if he stays like this he will not manage at home.  Final Clinical Impression(s) / ED Diagnoses Final diagnoses:  Encephalopathy    Rx / DC Orders ED Discharge Orders    None       Davonna Belling, MD 07/23/20 1457    Davonna Belling, MD 07/23/20 [phone]

## 2020-07-23 NOTE — ED Notes (Signed)
Pt in bed, pt requests more pain med, md notified, plan to do oral medication and then dc. Relayed message to pt, asked if he wanted to call someone to come get him, informed him that his sister had called earlier, pt talked with sister on the phone to come get him, sister then called and explained that he has been a little weak as of late and if we could bring him out in a wc, that would be great, asked that she come into er and talk with md before d.c

## 2020-07-24 DIAGNOSIS — Z20822 Contact with and (suspected) exposure to covid-19: Secondary | ICD-10-CM | POA: Diagnosis present

## 2020-07-24 DIAGNOSIS — Z880 Allergy status to penicillin: Secondary | ICD-10-CM | POA: Diagnosis not present

## 2020-07-24 DIAGNOSIS — Z888 Allergy status to other drugs, medicaments and biological substances status: Secondary | ICD-10-CM | POA: Diagnosis not present

## 2020-07-24 DIAGNOSIS — R109 Unspecified abdominal pain: Secondary | ICD-10-CM | POA: Diagnosis not present

## 2020-07-24 DIAGNOSIS — Z881 Allergy status to other antibiotic agents status: Secondary | ICD-10-CM | POA: Diagnosis not present

## 2020-07-24 DIAGNOSIS — K2100 Gastro-esophageal reflux disease with esophagitis, without bleeding: Secondary | ICD-10-CM | POA: Diagnosis present

## 2020-07-24 DIAGNOSIS — E1122 Type 2 diabetes mellitus with diabetic chronic kidney disease: Secondary | ICD-10-CM | POA: Diagnosis present

## 2020-07-24 DIAGNOSIS — E118 Type 2 diabetes mellitus with unspecified complications: Secondary | ICD-10-CM | POA: Diagnosis not present

## 2020-07-24 DIAGNOSIS — E1143 Type 2 diabetes mellitus with diabetic autonomic (poly)neuropathy: Secondary | ICD-10-CM | POA: Diagnosis present

## 2020-07-24 DIAGNOSIS — Z91013 Allergy to seafood: Secondary | ICD-10-CM | POA: Diagnosis not present

## 2020-07-24 DIAGNOSIS — E1165 Type 2 diabetes mellitus with hyperglycemia: Secondary | ICD-10-CM

## 2020-07-24 DIAGNOSIS — R4182 Altered mental status, unspecified: Secondary | ICD-10-CM | POA: Diagnosis present

## 2020-07-24 DIAGNOSIS — G9341 Metabolic encephalopathy: Secondary | ICD-10-CM | POA: Diagnosis present

## 2020-07-24 DIAGNOSIS — N186 End stage renal disease: Secondary | ICD-10-CM | POA: Diagnosis present

## 2020-07-24 DIAGNOSIS — Z992 Dependence on renal dialysis: Secondary | ICD-10-CM | POA: Diagnosis not present

## 2020-07-24 DIAGNOSIS — Z8249 Family history of ischemic heart disease and other diseases of the circulatory system: Secondary | ICD-10-CM | POA: Diagnosis not present

## 2020-07-24 DIAGNOSIS — I5032 Chronic diastolic (congestive) heart failure: Secondary | ICD-10-CM | POA: Diagnosis present

## 2020-07-24 DIAGNOSIS — Z833 Family history of diabetes mellitus: Secondary | ICD-10-CM | POA: Diagnosis not present

## 2020-07-24 DIAGNOSIS — N2581 Secondary hyperparathyroidism of renal origin: Secondary | ICD-10-CM | POA: Diagnosis present

## 2020-07-24 DIAGNOSIS — H409 Unspecified glaucoma: Secondary | ICD-10-CM | POA: Diagnosis present

## 2020-07-24 DIAGNOSIS — G9340 Encephalopathy, unspecified: Secondary | ICD-10-CM | POA: Diagnosis present

## 2020-07-24 DIAGNOSIS — Z882 Allergy status to sulfonamides status: Secondary | ICD-10-CM | POA: Diagnosis not present

## 2020-07-24 DIAGNOSIS — G8929 Other chronic pain: Secondary | ICD-10-CM | POA: Diagnosis present

## 2020-07-24 DIAGNOSIS — I132 Hypertensive heart and chronic kidney disease with heart failure and with stage 5 chronic kidney disease, or end stage renal disease: Secondary | ICD-10-CM | POA: Diagnosis present

## 2020-07-24 DIAGNOSIS — I10 Essential (primary) hypertension: Secondary | ICD-10-CM

## 2020-07-24 DIAGNOSIS — K3184 Gastroparesis: Secondary | ICD-10-CM | POA: Diagnosis present

## 2020-07-24 DIAGNOSIS — Z8371 Family history of colonic polyps: Secondary | ICD-10-CM | POA: Diagnosis not present

## 2020-07-24 DIAGNOSIS — F419 Anxiety disorder, unspecified: Secondary | ICD-10-CM | POA: Diagnosis present

## 2020-07-24 DIAGNOSIS — K911 Postgastric surgery syndromes: Secondary | ICD-10-CM | POA: Diagnosis present

## 2020-07-24 LAB — CBC
HCT: 29.7 % — ABNORMAL LOW (ref 39.0–52.0)
Hemoglobin: 10 g/dL — ABNORMAL LOW (ref 13.0–17.0)
MCH: 33.1 pg (ref 26.0–34.0)
MCHC: 33.7 g/dL (ref 30.0–36.0)
MCV: 98.3 fL (ref 80.0–100.0)
Platelets: 136 10*3/uL — ABNORMAL LOW (ref 150–400)
RBC: 3.02 MIL/uL — ABNORMAL LOW (ref 4.22–5.81)
RDW: 12.4 % (ref 11.5–15.5)
WBC: 7.5 10*3/uL (ref 4.0–10.5)
nRBC: 0 % (ref 0.0–0.2)

## 2020-07-24 LAB — GLUCOSE, CAPILLARY
Glucose-Capillary: 92 mg/dL (ref 70–99)
Glucose-Capillary: 164 mg/dL — ABNORMAL HIGH (ref 70–99)

## 2020-07-24 LAB — CBG MONITORING, ED
Glucose-Capillary: 107 mg/dL — ABNORMAL HIGH (ref 70–99)
Glucose-Capillary: 90 mg/dL (ref 70–99)
Glucose-Capillary: 132 mg/dL — ABNORMAL HIGH (ref 70–99)

## 2020-07-24 LAB — BASIC METABOLIC PANEL
Anion gap: 17 — ABNORMAL HIGH (ref 5–15)
BUN: 45 mg/dL — ABNORMAL HIGH (ref 6–20)
CO2: 21 mmol/L — ABNORMAL LOW (ref 22–32)
Calcium: 8.9 mg/dL (ref 8.9–10.3)
Chloride: 96 mmol/L — ABNORMAL LOW (ref 98–111)
Creatinine, Ser: 7.26 mg/dL — ABNORMAL HIGH (ref 0.61–1.24)
GFR, Estimated: 8 mL/min — ABNORMAL LOW (ref >60)
Glucose, Bld: 97 mg/dL (ref 70–99)
Potassium: 3.4 mmol/L — ABNORMAL LOW (ref 3.5–5.1)
Sodium: 134 mmol/L — ABNORMAL LOW (ref 135–145)

## 2020-07-24 LAB — MAGNESIUM: Magnesium: 1.7 mg/dL (ref 1.7–2.4)

## 2020-07-24 LAB — MRSA PCR SCREENING: MRSA by PCR: NEGATIVE

## 2020-07-24 MED ORDER — DEXMEDETOMIDINE HCL IN NACL 400 MCG/100ML IV SOLN
0.4000 ug/kg/h | INTRAVENOUS | Status: DC
  Administered 2020-07-24: 0.6 ug/kg/h via INTRAVENOUS
  Administered 2020-07-25: 0.4 ug/kg/h via INTRAVENOUS
  Filled 2020-07-24 (×3): qty 100

## 2020-07-24 MED ORDER — DEXMEDETOMIDINE HCL IN NACL 200 MCG/50ML IV SOLN: 0.4000 ug/kg/h | INTRAVENOUS | Status: DC

## 2020-07-24 MED ORDER — SODIUM CHLORIDE 0.9 % IV SOLN: 0.4000 ug/kg/h | INTRAVENOUS | Status: DC

## 2020-07-24 MED ORDER — DOUBLE ANTIBIOTIC 500-10000 UNIT/GM EX OINT
TOPICAL_OINTMENT | Freq: Every day | CUTANEOUS | Status: DC
  Administered 2020-07-24 – 2020-07-26 (×2): 1 via TOPICAL
  Filled 2020-07-24 (×2): qty 1

## 2020-07-24 MED ORDER — CHLORHEXIDINE GLUCONATE CLOTH 2 % EX PADS
6.0000 | MEDICATED_PAD | Freq: Every day | CUTANEOUS | Status: DC
  Administered 2020-07-24 – 2020-07-26 (×3): 6 via TOPICAL

## 2020-07-24 MED ORDER — DEXMEDETOMIDINE HCL IN NACL 200 MCG/50ML IV SOLN
0.4000 ug/kg/h | INTRAVENOUS | Status: DC
  Administered 2020-07-24: 0.4 ug/kg/h via INTRAVENOUS
  Filled 2020-07-24: qty 50

## 2020-07-24 NOTE — Progress Notes (Signed)
RN called RT with concern of patient having periods of apnea while sleeping. RN contacted MD and an order was placed for CPAP. RT attempted to place patient on CPAP and patient refused stating he is claustrophobic and can not wear the mask whether it be full face or nasal. RT made RN aware. Will monitor as needed.

## 2020-07-24 NOTE — Progress Notes (Signed)
PROGRESS NOTE   Timothy Clark  UEA:[id number] DOB: 1970-12-18 DOA: 07/23/2020 PCP: [name], MD   Chief Complaint  Patient presents with  . Altered Mental Status    Brief Admission History:  49 y.o. male with medical history significant for ESRD, hypertension, diabetes mellitus, CHF, gastroparesis, dumping syndrome.  Patient presented to the ED with complaints of abdominal pain that started this morning.  Patient went for his dialysis, when he started having abdominal pain with vomiting, confused and combative.  Patient had 2 Hrs left to go on his dialysis, when it was stopped and he was sent to ED via EMS.  Per EMS patient was initially cooperative then became combative.  He had an admission in July 2021 with similar presentation of acute encephalopathy that resolved after HD treatment and temporary precedex infusion treatment. His abdominal pain work up has been unrevealing.   Unfortunately he has become increasing combative, agitated and encephalopathic and has become a danger to self and others.  He was placed on a precedex infusion.      Assessment & Plan:   Principal Problem:   Acute metabolic encephalopathy   abdominal pain Active Problems:   Diabetes mellitus type 2 with complications, uncontrolled   Essential hypertension   Gastroparesis due to DM   ESRD on dialysis   Acute encephalopathy  1. Acute metabolic encephalopathy - Pt has become increasingly combative and has become a danger to self and others.  I have started him on a precedex infusion which he has required in the past.  Hopefully we can have him weaned off in the next 24 hours.  Will consult nephrology for HD which has helped in the past with his encephalopathy.  2. Abdominal pain - CT abdomen with no abnormal findings to explain presentation or symptoms.  Follow.  3. ESRD on HD - Consult inpatient nephrology for hemodialysis treatment.   4. Type 2 diabetes mellitus with renal complications -continue current  CBG monitoring and SSI coverage.  5. Essential hypertension - BPs have improved on precedex infusion. Following.  6. Bipolar disease - resume home geodon 7. Epilepsy - IV keppra as ordered.  8. Chronic toe wound - WOC consultation for recommendations.    DVT prophylaxis:  SQ heparin Code Status:  Full  Family Communication: no family is present  Disposition: TBD  Status is: Inpatient  Remains inpatient appropriate because:IV treatments appropriate due to intensity of illness or inability to take PO and Inpatient level of care appropriate due to severity of illness  Dispo: The patient is from: Home              Anticipated d/c is to: Home              Anticipated d/c date is: 2 days              Patient currently is not medically stable to d/c.  Consultants:   Nephrology   Procedures:     Antimicrobials:     Subjective: Pt is encephalopathic   Objective: Vitals:   07/24/20 1100 07/24/20 1130 07/24/20 1200 07/24/20 1305  BP: (!) 152/78 (!) 159/69 (!) 165/79   Pulse: 81 75 73 73  Resp: 18 16 14 13   Temp:    98.5 F (36.9 C)  TempSrc:    Oral  SpO2: 97% 98% 95% 100%  Weight:    125.4 kg  Height:    5\' 11"  (1.803 m)    Intake/Output Summary (Last 24 hours) at  07/24/2020 1457 Last data filed at 07/24/2020 1134 Gross per 24 hour  Intake 153.04 ml  Output --  Net 153.04 ml   Filed Weights   07/23/20 1023 07/24/20 1305  Weight: 129.3 kg 125.4 kg   Examination:  General exam: encephalopathic, thrashing around in bed, pulling at rails, trying to get out of bed, very confused.   Respiratory system: Clear to auscultation. Respiratory effort normal. Cardiovascular system: normal S1 & S2 heard. No JVD, murmurs, rubs, gallops or clicks. No pedal edema. Gastrointestinal system: Abdomen is nondistended, soft and nontender. No organomegaly or masses felt. Normal bowel sounds heard. Central nervous system: Alert and oriented. No focal neurological deficits. Extremities:  Symmetric 5 x 5 power. Skin: No rashes, lesions or ulcers Psychiatry: severe encephalopathy.   Data Reviewed: I have personally reviewed following labs and imaging studies  CBC: Recent Labs  Lab 07/23/20 1037 07/24/20 0341  WBC 7.8 7.5  HGB 10.9* 10.0*  HCT 32.7* 29.7*  MCV 98.5 98.3  PLT 129* 136*    Basic Metabolic Panel: Recent Labs  Lab 07/23/20 1037 07/24/20 0341  NA 134* 134*  K 3.5 3.4*  CL 95* 96*  CO2 27 21*  GLUCOSE 139* 97  BUN 36* 45*  CREATININE 6.03* 7.26*  CALCIUM 8.9 8.9  MG  --  1.7    GFR: Estimated Creatinine Clearance: 16.4 mL/min (A) (by C-G formula based on SCr of 7.26 mg/dL (H)).  Liver Function Tests: Recent Labs  Lab 07/23/20 1037  AST 39  ALT 15  ALKPHOS 42  BILITOT 1.2  PROT 7.7  ALBUMIN 4.0    CBG: Recent Labs  Lab 07/23/20 2044 07/23/20 2338 07/24/20 0315 07/24/20 0807 07/24/20 1146  GLUCAP 166* 113* 107* 90 132*    Recent Results (from the past 240 hour(s))  SARS Coronavirus 2 by RT PCR (hospital order, performed in [place] hospital lab)     Status: None   Collection Time: 07/23/20  4:12 PM  Result Value Ref Range Status   SARS Coronavirus 2 NEGATIVE NEGATIVE Final    Comment: (NOTE) SARS-CoV-2 target nucleic acids are NOT DETECTED.  The SARS-CoV-2 RNA is generally detectable in upper and lower respiratory specimens during the acute phase of infection. The lowest concentration of SARS-CoV-2 viral copies this assay can detect is 250 copies / mL. A negative result does not preclude SARS-CoV-2 infection and should not be used as the sole basis for treatment or other patient management decisions.  A negative result may occur with improper specimen collection / handling, submission of specimen other than nasopharyngeal swab, presence of viral mutation(s) within the areas targeted by this assay, and inadequate number of viral copies (<250 copies / mL). A negative result must be combined with  clinical observations, patient history, and epidemiological information.  Fact Sheet for Patients:   StrictlyIdeas.no  Fact Sheet for Healthcare Providers: BankingDealers.co.za  This test is not yet approved or  cleared by the [place] FDA and has been authorized for detection and/or diagnosis of SARS-CoV-2 by FDA under an Emergency Use Authorization (EUA).  This EUA will remain in effect (meaning this test can be used) for the duration of the COVID-19 declaration under Section 564(b)(1) of the Act, 21 U.S.C. section 360bbb-3(b)(1), unless the authorization is terminated or revoked sooner.  Performed at [facility], [address] [place], [place] [address]      Radiology Studies: CT ABDOMEN WO CONTRAST  Result Date: 07/23/2020 CLINICAL DATA:  49 year old male with upper abdominal pain.  EXAM: CT ABDOMEN WITHOUT CONTRAST TECHNIQUE: Multidetector CT imaging of the abdomen was performed following the standard protocol without IV contrast. COMPARISON:  CT abdomen pelvis dated 05/04/2020. FINDINGS: Evaluation of this exam is limited in the absence of intravenous contrast. Lower chest: The visualized lung bases are clear. There is borderline cardiomegaly. Coronary vascular calcifications. No intra-abdominal free air or free fluid. Hepatobiliary: The liver is unremarkable. No intrahepatic biliary ductal dilatation. The gallbladder is unremarkable. Pancreas: Unremarkable. No pancreatic ductal dilatation or surrounding inflammatory changes. Spleen: Normal in size without focal abnormality. Adrenals/Urinary Tract: The adrenal glands unremarkable. There is mild bilateral renal parenchyma atrophy. There is no hydronephrosis or nephrolithiasis on either side. Mild bilateral perinephric stranding, nonspecific. The visualized ureters are unremarkable. Stomach/Bowel: There is no bowel obstruction or active inflammation. The visualized appendix appears  normal. Vascular/Lymphatic: The abdominal aorta and IVC are grossly unremarkable on this noncontrast CT. No portal venous gas. No adenopathy. Other: Bilateral gynecomastia. Musculoskeletal: Degenerative changes of the spine. No acute osseous pathology. IMPRESSION: 1. No acute intra-abdominal pathology. 2. No hydronephrosis or nephrolithiasis. Electronically Signed   By: Anner Crete M.D.   On: 07/23/2020 22:56   Scheduled Meds: . amLODipine  5 mg Oral Daily  . carvedilol  25 mg Oral BID WC  . Chlorhexidine Gluconate Cloth  6 each Topical Daily  . cloNIDine  0.2 mg Oral BID  . divalproex  250 mg Oral Daily  . heparin  5,000 Units Subcutaneous Q8H  . insulin aspart  0-9 Units Subcutaneous Q4H  . polymixin-bacitracin   Topical Daily  . sucralfate  1 g Oral TID WC & HS  . ziprasidone  60 mg Oral BID WC   Continuous Infusions: . dexmedetomidine 0.6 mcg/kg/hr (07/24/20 1209)  . levETIRAcetam 500 mg (07/24/20 1111)    LOS: 0 days   Critical Care Procedure Note Authorized and Performed by: Murvin Natal MD  Total Critical Care time:  55 mins  Due to a high probability of clinically significant, life threatening deterioration, the patient required my highest level of preparedness to intervene emergently and I personally spent this critical care time directly and personally managing the patient.  This critical care time included obtaining a history; examining the patient, pulse oximetry; ordering and review of studies; arranging urgent treatment with development of a management plan; evaluation of patient's response of treatment; frequent reassessment; and discussions with other providers.  This critical care time was performed to assess and manage the high probability of imminent and life threatening deterioration that could result in multi-organ failure.  It was exclusive of separately billable procedures and treating other patients and teaching time.   Irwin Brakeman, MD How to contact the [facility]  Attending or Consulting provider [place] or covering provider during after hours [place], for this patient?  1. Check the care team in [facility] and look for a) attending/consulting TRH provider listed and b) the [facility] team listed 2. Log into [email or web address] and use 's universal password to access. If you do not have the password, please contact the hospital operator. 3. Locate the [facility] provider you are looking for under [facility] and page to a number that you can be directly reached. 4. If you still have difficulty reaching the provider, please page the [facility] (Director on Call) for the Hospitalists listed on amion for assistance.  07/24/2020, 2:57 PM

## 2020-07-24 NOTE — TOC Initial Note (Signed)
Transition of Care [facility]) - Initial/Assessment Note    Patient Details  Name: Timothy Clark MRN: [id number] Date of Birth: 09/09/1970  Transition of [facility]) CM/SW Contact:    Iona Beard, [place] Phone Number: 07/24/2020, 8:03 PM  Clinical Narrative:                 Pt admitted due to intractable abdominal pain. CSW visited pt in room to complete assessment. Pt states he lives with his father and sister. Pt states that he can complete ADLs independently. Pt states that his sister or father will provide transportation when needed. Pt states that he has a HH RN one time a week through [facility]. Pt does not use any DME. Pt has dialysis at [facility] in [place] MWF. TOC to follow.   Expected Discharge Plan: [place] Barriers to Discharge: Continued Medical Work up   Patient Goals and CMS Choice Patient states their goals for this hospitalization and ongoing recovery are:: Return home   Choice offered to / list presented to : NA  Expected Discharge Plan and Services Expected Discharge Plan: [place] In-house Referral: NA Discharge Planning Services: NA Post Acute Care Choice: [place] arrangements for the past 2 months: Single Family Home                 DME Arranged: N/A DME Agency: NA                  Prior Living Arrangements/Services Living arrangements for the past 2 months: Single Family Home Lives with:: Siblings,Parents Patient language and need for interpreter reviewed:: Yes Do you feel safe going back to the place where you live?: Yes        Care giver support system in place?: Yes (comment) (Hilton,Eddie (Sister)   [phone]) Current home services: Home RN Criminal Activity/Legal Involvement Pertinent to Current Situation/Hospitalization: No - Comment as needed  Activities of Daily Living Home Assistive Devices/Equipment: None ADL Screening (condition at time of admission) Patient's cognitive ability  adequate to safely complete daily activities?: No Is the patient deaf or have difficulty hearing?: No Does the patient have difficulty seeing, even when wearing glasses/contacts?: No Does the patient have difficulty concentrating, remembering, or making decisions?: Yes Patient able to express need for assistance with ADLs?: No Does the patient have difficulty dressing or bathing?: Yes Independently performs ADLs?: No Communication: Independent Dressing (OT): Dependent Is this a change from baseline?: Change from baseline, expected to last <3days Grooming: Dependent Is this a change from baseline?: Change from baseline, expected to last <3 days Feeding: Dependent Is this a change from baseline?: Change from baseline, expected to last <3 days Bathing: Dependent Is this a change from baseline?: Change from baseline, expected to last <3 days Toileting: Dependent Is this a change from baseline?: Change from baseline, expected to last <3 days In/Out Bed: Dependent Is this a change from baseline?: Change from baseline, expected to last <3 days Walks in Home: Dependent Is this a change from baseline?: Change from baseline, expected to last <3 days Does the patient have difficulty walking or climbing stairs?: Yes Weakness of Legs: Both Weakness of Arms/Hands: Both  Permission Sought/Granted                  Emotional Assessment Appearance:: Appears stated age Attitude/Demeanor/Rapport: Engaged Affect (typically observed): Accepting Orientation: : Oriented to Self,Oriented to Place,Oriented to  Time,Oriented to Situation Alcohol / Substance Use: Not  Applicable Psych Involvement: No (comment)  Admission diagnosis:  Encephalopathy [G93.40] Acute encephalopathy [G93.40] Intractable abdominal pain [R10.9] Patient Active Problem List   Diagnosis Date Noted  . Acute encephalopathy 07/24/2020  . Intractable abdominal pain 07/23/2020  . Encephalopathy   . Colon cancer screening  07/12/2020  . 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 Stable treated proliferative diabetic retinopathy of right eye determined by examination associated with type 2 diabetes mellitus ([place]) 07/03/2020  . Anxiety 03/13/2020  . History of COVID-19 03/13/2020  . History of gastroesophageal reflux (GERD) 03/13/2020  . History of Helicobacter pylori infection 03/13/2020  . Pre-transplant evaluation for kidney transplant 03/13/2020  . Renovascular hypertension 03/13/2020  . Hematemesis 03/02/2020  . [place] emesis 03/01/2020  . Complication of vascular access for dialysis 01/29/2020  . Follow-up examination after eye surgery 12/15/2019  . Type 2 diabetes with nephropathy ([place]) 12/15/2019  . Proliferative diabetic retinopathy of left eye with macular edema associated with type 2 diabetes mellitus ([place]) 12/15/2019  . Vitreous hemorrhage of left eye ([place]) 12/15/2019  . Secondary glaucoma due to combination mechanisms, left, severe stage 12/15/2019  . Nausea with vomiting 12/08/2019  . Hyperglycemia due to type 2 diabetes mellitus ([place]) 10/28/2019  . DKA (diabetic ketoacidoses) 10/27/2019  . ESRD on dialysis ([place]) 06/26/2019  . Constipation 03/15/2019  . Chronic renal insufficiency, stage V ([place]) 02/21/2019  . Type 2 diabetes mellitus with proliferative retinopathy ([place]) 10/25/2018  . Acute renal failure superimposed on stage 4 chronic kidney disease ([place]) 10/25/2018  . Extravasation injury of IV catheter site with other complication, initial encounter ([place])   . Intractable vomiting   . Chronic kidney disease   . Poor venous access   . Gastroparesis 10/24/2018  . Closed displaced fracture of lateral end of right clavicle 06/18/2018  . HTN (hypertension), malignant 06/13/2018  . Scrotal swelling 11/10/2017  . Swelling 11/09/2017  . Scrotal edema   . Obesity, Class III, BMI 40-49.9 (morbid obesity) ([place]) 11/02/2017  . CKD stage 3 due to type 2 diabetes mellitus ([place]) 10/30/2017  . Wound  eschars of foot, right  10/30/2017  . Chronic abdominal pain 09/21/2017  . Hypertensive urgency 09/21/2017  . Prolonged QT interval 09/21/2017  . GERD (gastroesophageal reflux disease) 07/14/2017  . Glaucoma 07/14/2017  . Anemia 07/14/2017  . Pressure injury of skin 02/01/2017  . Insulin dependent diabetes mellitus 09/07/2016  . Orthostatic syncope 09/05/2016  . 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 Gastroparesis due to DM ([place]) 02/06/2015  . Essential hypertension 12/06/2014  . Diabetic macular edema ([place]) 02/18/2014  . PDR (proliferative diabetic retinopathy) ([place]) 02/06/2014  . Neovascular glaucoma of left eye 01/11/2014  . Diabetic infection of left heel ([place]) 02/14/2010  . UNSPECIFIED PERIPHERAL VASCULAR DISEASE 10/19/2009  . ERECTILE DYSFUNCTION, ORGANIC 10/19/2009  . NUMBNESS 07/20/2009  . Diabetes mellitus type 2 with complications, uncontrolled ([place]) 03/22/2009  . Hyperlipidemia 03/22/2009  . Depression 03/22/2009   PCP:  [name], MD Pharmacy:   [facility] [address] [place] [address] Phone: [phone] Fax: [phone]  [facility] [place], [place] - [address] [place] [address] Phone: [phone] Fax: [phone]     Social Determinants of Health (SDOH) Interventions    Readmission Risk Interventions Readmission Risk Prevention Plan 07/24/2020 10/27/2018 10/25/2018  Transportation Screening Complete Complete Complete  PCP or Specialist Appt within 3-5 Days - Complete -  Home  Care Screening Complete - -  Medication Review (RN CM) Complete - -  HRI or Home Care Consult - Complete -  Social Work Consult for Recovery Care Planning/Counseling - Not Complete -  Palliative Care Screening - Not Applicable Not Applicable  Medication Review (RN Care Manager) - Complete -  Some recent data might be hidden

## 2020-07-24 NOTE — ED Notes (Signed)
Pt trying to break side rails and siding out of bottom of bed. Wrist restraint were tightened and mitts placed on hands. Bilateral ankle restraints placed on pt as well.

## 2020-07-24 NOTE — Consult Note (Signed)
I have placed a request via Secure Chat to Dr. Wynetta Emery  requesting photos of the wound areas of concern to be placed in the EMR.   [place], [place], [place]

## 2020-07-24 NOTE — Consult Note (Signed)
[place] Nurse wound consult note Consultation was completed by review of records, images and assistance from the bedside nurse/clinical staff.    Contacted bedside nurse; possible use of telepsych cart for consultation  Reason for Consult:chronic big toe wound Wound type:Neuropathic foot ulceration  Pressure Injury POA: NA Measurement: see nursing flow sheets Wound bed: staff report dried blood from patient thrashing about in the bed, the wound however is clean, pink, and dry. Drainage (amount, consistency, odor) none per bedside nursing staff Periwound:intact  Dressing procedure/placement/frequency: Antibiotic ointment and dry dressing. Reapply daily.    Re consult if needed, will not follow at this time. Thanks  [name] [employer], RN,CWOCN, CNS, [place] [phone])

## 2020-07-25 DIAGNOSIS — R109 Unspecified abdominal pain: Secondary | ICD-10-CM

## 2020-07-25 LAB — GLUCOSE, CAPILLARY
Glucose-Capillary: 103 mg/dL — ABNORMAL HIGH (ref 70–99)
Glucose-Capillary: 110 mg/dL — ABNORMAL HIGH (ref 70–99)
Glucose-Capillary: 172 mg/dL — ABNORMAL HIGH (ref 70–99)
Glucose-Capillary: 173 mg/dL — ABNORMAL HIGH (ref 70–99)
Glucose-Capillary: 177 mg/dL — ABNORMAL HIGH (ref 70–99)
Glucose-Capillary: 184 mg/dL — ABNORMAL HIGH (ref 70–99)
Glucose-Capillary: 216 mg/dL — ABNORMAL HIGH (ref 70–99)

## 2020-07-25 LAB — CBC WITH DIFFERENTIAL/PLATELET
Abs Immature Granulocytes: 0.01 10*3/uL (ref 0.00–0.07)
Basophils Absolute: 0 10*3/uL (ref 0.0–0.1)
Basophils Relative: 0 %
Eosinophils Absolute: 0.1 10*3/uL (ref 0.0–0.5)
Eosinophils Relative: 1 %
HCT: 29.8 % — ABNORMAL LOW (ref 39.0–52.0)
Hemoglobin: 10.1 g/dL — ABNORMAL LOW (ref 13.0–17.0)
Immature Granulocytes: 0 %
Lymphocytes Relative: 14 %
Lymphs Abs: 1.1 10*3/uL (ref 0.7–4.0)
MCH: 33.4 pg (ref 26.0–34.0)
MCHC: 33.9 g/dL (ref 30.0–36.0)
MCV: 98.7 fL (ref 80.0–100.0)
Monocytes Absolute: 0.6 10*3/uL (ref 0.1–1.0)
Monocytes Relative: 8 %
Neutro Abs: 5.9 10*3/uL (ref 1.7–7.7)
Neutrophils Relative %: 77 %
Platelets: 123 10*3/uL — ABNORMAL LOW (ref 150–400)
RBC: 3.02 MIL/uL — ABNORMAL LOW (ref 4.22–5.81)
RDW: 12.4 % (ref 11.5–15.5)
WBC: 7.7 10*3/uL (ref 4.0–10.5)
nRBC: 0 % (ref 0.0–0.2)

## 2020-07-25 LAB — COMPREHENSIVE METABOLIC PANEL
ALT: 14 U/L (ref 0–44)
AST: 25 U/L (ref 15–41)
Albumin: 3.7 g/dL (ref 3.5–5.0)
Alkaline Phosphatase: 36 U/L — ABNORMAL LOW (ref 38–126)
Anion gap: 13 (ref 5–15)
BUN: 51 mg/dL — ABNORMAL HIGH (ref 6–20)
CO2: 25 mmol/L (ref 22–32)
Calcium: 8.8 mg/dL — ABNORMAL LOW (ref 8.9–10.3)
Chloride: 97 mmol/L — ABNORMAL LOW (ref 98–111)
Creatinine, Ser: 7.44 mg/dL — ABNORMAL HIGH (ref 0.61–1.24)
GFR, Estimated: 8 mL/min — ABNORMAL LOW (ref >60)
Glucose, Bld: 171 mg/dL — ABNORMAL HIGH (ref 70–99)
Potassium: 3 mmol/L — ABNORMAL LOW (ref 3.5–5.1)
Sodium: 135 mmol/L (ref 135–145)
Total Bilirubin: 1.1 mg/dL (ref 0.3–1.2)
Total Protein: 7 g/dL (ref 6.5–8.1)

## 2020-07-25 LAB — PHOSPHORUS: Phosphorus: 3.8 mg/dL (ref 2.5–4.6)

## 2020-07-25 LAB — MAGNESIUM: Magnesium: 1.9 mg/dL (ref 1.7–2.4)

## 2020-07-25 MED ORDER — CHLORHEXIDINE GLUCONATE CLOTH 2 % EX PADS
6.0000 | MEDICATED_PAD | Freq: Every day | CUTANEOUS | Status: DC
  Administered 2020-07-26: 6 via TOPICAL

## 2020-07-25 MED ORDER — PENTAFLUOROPROP-TETRAFLUOROETH EX AERO: 1.0000 "application " | INHALATION_SPRAY | CUTANEOUS | Status: DC | PRN

## 2020-07-25 MED ORDER — PANTOPRAZOLE SODIUM 40 MG IV SOLR
40.0000 mg | INTRAVENOUS | Status: DC
  Administered 2020-07-25 – 2020-07-26 (×2): 40 mg via INTRAVENOUS
  Filled 2020-07-25 (×2): qty 40

## 2020-07-25 MED ORDER — HEPARIN SODIUM (PORCINE) 1000 UNIT/ML DIALYSIS: 20.0000 [IU]/kg | INTRAMUSCULAR | Status: DC | PRN

## 2020-07-25 MED ORDER — SODIUM CHLORIDE 0.9 % IV SOLN: 100.0000 mL | INTRAVENOUS | Status: DC | PRN

## 2020-07-25 NOTE — Consult Note (Signed)
[facility] Renal Consultation Note    Indication for Consultation:  Management of ESRD/hemodialysis; anemia, hypertension/volume and secondary hyperparathyroidism  HPI: Timothy Clark is a 49 y.o. male with PMH significant for DM, HTN, CHF, gastroparesis, ESRD on HD MWF at [facility], and Bipolar disorder who was sent via EMS to [place] ED after developing severe abdominal pain with vomiting and altered mental status half way through dialysis on Monday.  He was initially cooperative but then became combative and confused.  He had a similar presentation in July that resolved after precedex and HD.  In the ED he became increasingly combative and agitated and was admitted to ICU and started on precedex infusion.  We were consulted to provide dialysis during his hospitalization.   Past Medical History:  Diagnosis Date  . Anemia   . Anxiety   . CHF (congestive heart failure) (HCC)    diastolic dysfunction  . Chronic abdominal pain   . COVID-19 07/2019  . Depression   . Esophagitis   . ESRD on hemodialysis [facility])    undergoing evaluation for transplant  . Eye hemorrhage   . Gastritis   . Gastroparesis   . GERD (gastroesophageal reflux disease)   . Glaucoma   . Helicobacter pylori (H. pylori) infection 2016   ERADICATION DOCUMENTED VIA STOOL TEST in AUG 2016 at [place]  . Hiccups   . Hypertension   . Nausea and vomiting    chronic, recurrent  . Neuropathy    feet  . Renal insufficiency   . Type 2 diabetes mellitus with complications (HCC)    diagnosed around age 84   Past Surgical History:  Procedure Laterality Date  . AMPUTATION TOE Right    great toe  . AV FISTULA INSERTION W/ RF MAGNETIC GUIDANCE Left 04/06/2019   Procedure: AV FISTULA INSERTION W/RF MAGNETIC GUIDANCE;  Surgeon: Katha Cabal, MD;  Location: [place] [facility];  Service: Cardiovascular;  Laterality: Left;  . AV FISTULA PLACEMENT Left 05/06/2019   Procedure: INSERTION OF ARTERIOVENOUS (AV)  GORE-TEX GRAFT ARM ( FOREARM LOOP );  Surgeon: Katha Cabal, MD;  Location: [facility]S;  Service: Vascular;  Laterality: Left;  . AV FISTULA PLACEMENT Left 03/21/2020   Procedure: INSERTION OF ARTERIOVENOUS (AV) GORE-TEX GRAFT ARM ( BRACHIAL AXILLARY );  Surgeon: Katha Cabal, MD;  Location: [facility] ORS;  Service: Vascular;  Laterality: Left;  . CATARACT EXTRACTION W/PHACO Left 05/19/2016   Procedure: CATARACT EXTRACTION PHACO AND INTRAOCULAR LENS PLACEMENT LEFT EYE;  Surgeon: Tonny Branch, MD;  Location: [facility];  Service: Ophthalmology;  Laterality: Left;  CDE: 7.30  . CATARACT EXTRACTION W/PHACO Right 06/23/2016   Procedure: CATARACT EXTRACTION PHACO AND INTRAOCULAR LENS PLACEMENT RIGHT EYE CDE=9.87;  Surgeon: Tonny Branch, MD;  Location: [facility];  Service: Ophthalmology;  Laterality: Right;  right  . DIALYSIS/PERMA CATHETER REMOVAL N/A 07/19/2019   Procedure: DIALYSIS/PERMA CATHETER REMOVAL;  Surgeon: Katha Cabal, MD;  Location: [place] [facility];  Service: Cardiovascular;  Laterality: N/A;  . ESOPHAGOGASTRODUODENOSCOPY  2015   Dr. Britta Mccreedy  . ESOPHAGOGASTRODUODENOSCOPY N/A 10/26/2014   RMR: Distal esophagititis-likely reflux related although an element of pill induced injuruy no exclueded  status post biopsy. Diffusely abnormal gastric mucosa of uncertain signigicane -status post gastric biopsy. Focal area of excoriation in the cardia most consistant with a trauma of heaving.   . ESOPHAGOGASTRODUODENOSCOPY  08/2015   [facility]: mild esophagitis and old gastric contents  . EYE SURGERY Left 2015   stent placed to  left eye  . EYE SURGERY Right 06/2019   per patient- to remove scar tissue  . INCISION AND DRAINAGE OF WOUND Right 07/16/2017   Procedure: IRRIGATION AND DEBRIDEMENT OF SOFT TISSUE OF ULCERATION RIGHT FOOT;  Surgeon: Caprice Beaver, DPM;  Location: [facility];  Service: Podiatry;  Laterality: Right;  . PERIPHERAL VASCULAR THROMBECTOMY Left 09/27/2019   Procedure: PERIPHERAL  VASCULAR THROMBECTOMY;  Surgeon: Katha Cabal, MD;  Location: [place] [facility];  Service: Cardiovascular;  Laterality: Left;  . PERIPHERAL VASCULAR THROMBECTOMY Left 10/27/2019   Procedure: PERIPHERAL VASCULAR THROMBECTOMY;  Surgeon: Algernon Huxley, MD;  Location: [place] [facility];  Service: Cardiovascular;  Laterality: Left;  . PERIPHERAL VASCULAR THROMBECTOMY Left 12/13/2019   Procedure: PERIPHERAL VASCULAR THROMBECTOMY;  Surgeon: Katha Cabal, MD;  Location: [place] [facility];  Service: Cardiovascular;  Laterality: Left;   Family History:   Family History  Problem Relation Age of Onset  . Ovarian cancer Mother   . Heart attack Father   . Colon polyps Father   . Cervical cancer Sister   . Diabetes Sister   . Colon cancer Neg Hx    Social History:  reports that he has never smoked. He has never used smokeless tobacco. He reports that he does not drink alcohol and does not use drugs. Allergies  Allergen Reactions  . Donnatal [Phenobarbital-Belladonna Alk] Anaphylaxis and Other (See Comments)    Reaction to GI cocktail  . Fish Allergy Anaphylaxis, Hives and Rash  . Haldol [Haloperidol Lactate] Other (See Comments)    Chest Pain  . Lidocaine Anaphylaxis and Other (See Comments)    Reaction to GI cocktail  . Maalox [Calcium Carbonate Antacid] Anaphylaxis and Other (See Comments)    Reaction to GI cocktail  . Shellfish Allergy Hives    Per patient: Patient stated he breaks out in hives when eating shellfish  . Aspirin Hives and Itching  . Doxycycline Hives, Nausea And Vomiting and Other (See Comments)    Severe   . Keflex [Cephalexin] Hives, Itching and Nausea And Vomiting  . Marinol [Dronabinol] Nausea And Vomiting and Other (See Comments)    Caused worsening vomiting.   . Other Swelling  . Penicillins Hives, Itching, Other (See Comments) and Rash    Has patient had a PCN reaction causing immediate rash, facial/tongue/throat swelling, SOB or lightheadedness with  hypotension: yes Has patient had a PCN reaction causing severe rash involving mucus membranes or skin necrosis: yes Has patient had a PCN reaction that required hospitalization no Has patient had a PCN reaction occurring within the last 10 years: yes If all of the above answers are "NO", then may proceed with Cephalospor  . Bactrim [Sulfamethoxazole-Trimethoprim] Itching   Prior to Admission medications   Medication Sig Start Date End Date Taking? Authorizing Provider  amLODipine (NORVASC) 5 MG tablet Take 5 mg by mouth daily.   Yes [provider]  atorvastatin (LIPITOR) 10 MG tablet Take 10 mg by mouth at bedtime.    Yes [provider]  brimonidine (ALPHAGAN) 0.15 % ophthalmic solution Place 1 drop into both eyes in the morning and at bedtime.    Yes [provider]  carvedilol (COREG) 25 MG tablet Take 25 mg by mouth 2 (two) times daily with a meal.   Yes [provider]  cloNIDine (CATAPRES) 0.2 MG tablet Take 0.2 mg by mouth 2 (two) times daily.    Yes [provider]  cyanocobalamin (,VITAMIN B-12,) 1000 MCG/ML injection Inject 1,000  mcg into the muscle every 30 (thirty) days.  04/19/18  Yes [provider]  divalproex (DEPAKOTE) 250 MG DR tablet Take 250 mg by mouth daily.   Yes [provider]  dorzolamide-timolol (COSOPT) 22.3-6.8 MG/ML ophthalmic solution Place 1 drop into both eyes 2 (two) times daily.  01/16/20  Yes [provider]  famotidine (PEPCID) 20 MG tablet Take by mouth. 07/22/20 08/06/20 Yes [provider]  folic acid (FOLVITE) 1 MG tablet Take 1 mg by mouth daily.   Yes [provider]  levETIRAcetam (KEPPRA) 500 MG tablet Take 500 mg by mouth 2 (two) times daily. 07/08/20  Yes [provider]  metoCLOPramide (REGLAN) 5 MG tablet Take 1 tablet (5 mg total) by mouth every 12 (twelve) hours. 03/03/20  Yes Barton Dubois, MD  NOVOLOG MIX 70/30 FLEXPEN (70-30) 100 UNIT/ML FlexPen  35-55 Units by Subconjunctival route See admin instructions. Inject 55 units subcutaneously in the morning & inject 35 units subcutaneously in the evening. 06/21/19  Yes [provider]  ondansetron (ZOFRAN) 4 MG tablet Take 4 mg by mouth every 8 (eight) hours as needed for nausea or vomiting.   Yes [provider]  pantoprazole (PROTONIX) 40 MG tablet Take 1 tablet (40 mg total) by mouth 2 (two) times daily before a meal. 08/24/18  Yes Annitta Needs, NP  sertraline (ZOLOFT) 100 MG tablet Take 100 mg by mouth daily. 02/10/20  Yes [provider]  sitaGLIPtin (JANUVIA) 25 MG tablet Take 25 mg by mouth daily.    Yes [provider]  sucralfate (CARAFATE) 1 g tablet Take 1 g by mouth in the morning, at noon, and at bedtime.    Yes [provider]  torsemide (DEMADEX) 20 MG tablet Take 40 mg by mouth daily.   Yes [provider]  ziprasidone (GEODON) 60 MG capsule Take 60 mg by mouth 2 (two) times daily with a meal.   Yes [provider]  LEADER UNIFINE PENTIPS PLUS 32G X 4 MM [place]  05/17/20   [provider]  prochlorperazine (COMPAZINE) 10 MG tablet Take by mouth. 07/22/20 07/29/20  [provider]   Current Facility-Administered Medications  Medication Dose Route Frequency Provider Last Rate Last Admin  . acetaminophen (TYLENOL) tablet 650 mg  650 mg Oral Q6H PRN Emokpae, Ejiroghene E, MD       Or  . acetaminophen (TYLENOL) suppository 650 mg  650 mg Rectal Q6H PRN Emokpae, Ejiroghene E, MD      . amLODipine (NORVASC) tablet 5 mg  5 mg Oral Daily Emokpae, Ejiroghene E, MD   5 mg at 07/24/20 0811  . carvedilol (COREG) tablet 25 mg  25 mg Oral BID WC Emokpae, Ejiroghene E, MD   25 mg at 07/24/20 1816  . Chlorhexidine Gluconate Cloth 2 % PADS 6 each  6 each Topical Daily Wynetta Emery, Clanford L, MD   6 each at 07/24/20 1428  . cloNIDine (CATAPRES) tablet 0.2 mg  0.2 mg Oral BID Emokpae, Ejiroghene E, MD   0.2 mg at 07/25/20 0023   . dexmedetomidine (PRECEDEX) 400 MCG/100ML (4 mcg/mL) infusion  0.4-1.2 mcg/kg/hr Intravenous Titrated Johnson, Clanford L, MD 12.93 mL/hr at 07/25/20 0756 0.4 mcg/kg/hr at 07/25/20 0756  . divalproex (DEPAKOTE) DR tablet 250 mg  250 mg Oral Daily Emokpae, Ejiroghene E, MD   250 mg at 07/24/20 0811  . insulin aspart (novoLOG) injection 0-9 Units  0-9 Units Subcutaneous Q4H Emokpae, Ejiroghene E, MD   2 Units at 07/25/20  0759  . labetalol (NORMODYNE) injection 10 mg  10 mg Intravenous Q2H PRN Emokpae, Ejiroghene E, MD      . levETIRAcetam (KEPPRA) IVPB 500 mg/100 mL premix  500 mg Intravenous Q12H Emokpae, Ejiroghene E, MD 400 mL/hr at 07/25/20 0235 500 mg at 07/25/20 0235  . pantoprazole (PROTONIX) injection 40 mg  40 mg Intravenous Q24H Manuella Ghazi, Pratik D, DO   40 mg at 07/25/20 0759  . polyethylene glycol (MIRALAX / GLYCOLAX) packet 17 g  17 g Oral Daily PRN Emokpae, Ejiroghene E, MD      . polymixin-bacitracin (POLYSPORIN) ointment   Topical Daily Wynetta Emery, Clanford L, MD   1 application at 63/89/37 1452  . sucralfate (CARAFATE) tablet 1 g  1 g Oral TID WC & HS Emokpae, Ejiroghene E, MD   1 g at 07/25/20 0023  . ziprasidone (GEODON) capsule 60 mg  60 mg Oral BID WC Emokpae, Ejiroghene E, MD   60 mg at 07/24/20 1817   Labs: Basic Metabolic Panel: Recent Labs  Lab 07/23/20 1037 07/24/20 0341 07/25/20 0511  NA 134* 134* 135  K 3.5 3.4* 3.0*  CL 95* 96* 97*  CO2 27 21* 25  GLUCOSE 139* 97 171*  BUN 36* 45* 51*  CREATININE 6.03* 7.26* 7.44*  CALCIUM 8.9 8.9 8.8*  PHOS  --   --  3.8   Liver Function Tests: Recent Labs  Lab 07/23/20 1037 07/25/20 0511  AST 39 25  ALT 15 14  ALKPHOS 42 36*  BILITOT 1.2 1.1  PROT 7.7 7.0  ALBUMIN 4.0 3.7   Recent Labs  Lab 07/23/20 1037  LIPASE 35   Recent Labs  Lab 07/23/20 1515  AMMONIA 21   CBC: Recent Labs  Lab 07/23/20 1037 07/24/20 0341 07/25/20 0511  WBC 7.8 7.5 7.7  NEUTROABS  --   --  5.9  HGB 10.9* 10.0* 10.1*  HCT 32.7*  29.7* 29.8*  MCV 98.5 98.3 98.7  PLT 129* 136* 123*   Cardiac Enzymes: No results for input(s): CKTOTAL, CKMB, CKMBINDEX, TROPONINI in the last 168 hours. CBG: Recent Labs  Lab 07/24/20 1618 07/24/20 2007 07/25/20 0014 07/25/20 0529 07/25/20 0747  GLUCAP 92 164* 103* 177* 172*   Iron Studies: No results for input(s): IRON, TIBC, TRANSFERRIN, FERRITIN in the last 72 hours. Studies/Results: CT ABDOMEN WO CONTRAST  Result Date: 07/23/2020 CLINICAL DATA:  49 year old male with upper abdominal pain. EXAM: CT ABDOMEN WITHOUT CONTRAST TECHNIQUE: Multidetector CT imaging of the abdomen was performed following the standard protocol without IV contrast. COMPARISON:  CT abdomen pelvis dated 05/04/2020. FINDINGS: Evaluation of this exam is limited in the absence of intravenous contrast. Lower chest: The visualized lung bases are clear. There is borderline cardiomegaly. Coronary vascular calcifications. No intra-abdominal free air or free fluid. Hepatobiliary: The liver is unremarkable. No intrahepatic biliary ductal dilatation. The gallbladder is unremarkable. Pancreas: Unremarkable. No pancreatic ductal dilatation or surrounding inflammatory changes. Spleen: Normal in size without focal abnormality. Adrenals/Urinary Tract: The adrenal glands unremarkable. There is mild bilateral renal parenchyma atrophy. There is no hydronephrosis or nephrolithiasis on either side. Mild bilateral perinephric stranding, nonspecific. The visualized ureters are unremarkable. Stomach/Bowel: There is no bowel obstruction or active inflammation. The visualized appendix appears normal. Vascular/Lymphatic: The abdominal aorta and IVC are grossly unremarkable on this noncontrast CT. No portal venous gas. No adenopathy. Other: Bilateral gynecomastia. Musculoskeletal: Degenerative changes of the spine. No acute osseous pathology. IMPRESSION: 1. No acute intra-abdominal pathology. 2. No hydronephrosis or nephrolithiasis.  Electronically Signed  By: Anner Crete M.D.   On: 07/23/2020 22:56    ROS: Pertinent items are noted in HPI. currently feels better without abdominal pain or agitation Physical Exam: Vitals:   07/25/20 0500 07/25/20 0600 07/25/20 0700 07/25/20 0800  BP: (!) 164/64 (!) 184/76 (!) 165/76 (!) 151/78  Pulse: 63 63 61 (!) 59  Resp: _0 Temp:    98.4 F (36.9 C)  TempSrc:    Axillary  SpO2: 100% 100% 100% 100%  Weight: 125 kg     Height:          Weight change: -3.875 kg  Intake/Output Summary (Last 24 hours) at 07/25/2020 0957 Last data filed at 07/25/2020 0500 Gross per 24 hour  Intake 92.67 ml  Output 350 ml  Net -257.33 ml   BP (!) 151/78   Pulse (!) 59   Temp 98.4 F (36.9 C) (Axillary)   Resp 13   Ht _1  (1.803 m)   Wt 125 kg   SpO2 100%   BMI 38.43 kg/m  General appearance: cooperative, no distress and slowed mentation Head: Normocephalic, without obvious abnormality, atraumatic Resp: clear to auscultation bilaterally Cardio: regular rate and rhythm and no rub GI: soft, non-tender; bowel sounds normal; no masses,  no organomegaly Extremities: edema 1+ bilateral lower extremity edema with chronic venous stasis changes  and LUE AVG +T/B Dialysis Access:  Dialysis Orders: Center: [place]  on MWF . EDW 127kg HD [place] 3K/2.5Ca  Time 4:15 Heparin 1200 unit bolus then 600 units/hr. Access LUE AVG BFR 400 DFR 600    Hectoral 2 mcg IV/HD Epogen 600   Units IV/HD  Venofer  50 mg IV weekly   Assessment/Plan: 1.  Acute metabolic encephalopathy- improved with precedex.  Unclear etiology, UDS pending. 2. Abdominal pain- CT scan without abnormal findings.  Plan per primary 3.  ESRD -  Plan to continue with HD on MWF schedule. 4.  Hypertension/volume  - stable 5.  Anemia  - continue with ESA 6.  Metabolic bone disease -  Continue with home meds when able to take po 7.  Nutrition - per primary 8. Bipolar disorder- continue with home meds 9. Seizure  disorder- on IV Keppra. 10. DM- per primary  Donetta Potts, MD [place] Pager [phone] 07/25/2020, 9:57 AM

## 2020-07-25 NOTE — Procedures (Signed)
   HEMODIALYSIS TREATMENT NOTE:  3.5 hour heparin-free HD completed via LUE AVG (15g ante/retrograde). Goal met: 1L removed.  All blood was returned and hemostasis was achieved in 15 minutes.  No changes from pre-HD assessment.  Rockwell Alexandria, RN

## 2020-07-25 NOTE — Progress Notes (Signed)
PROGRESS NOTE    Timothy Clark  KTG:[id number] DOB: 1971-08-09 DOA: 07/23/2020 PCP: [name], MD   Brief Narrative:  49 y.o.malewith medical history significant forESRD, hypertension, diabetes mellitus, CHF, gastroparesis, dumping syndrome.  Patient presented to the ED with complaints of abdominal pain that started this morning.Patient went for his dialysis, when he started having abdominal pain with vomiting, confused and combative. Patient had 2 Hrs leftto go on his dialysis,when it was stopped and he was sent to ED via EMS. Per EMS patient was initially cooperative then became combative.  He had an admission in July 2021 with similar presentation of acute encephalopathy that resolved after HD treatment and temporary precedex infusion treatment. His abdominal pain work up has been unrevealing.   Unfortunately he has become increasing combative, agitated and encephalopathic and has become a danger to self and others.  He was placed on a precedex infusion.       Assessment & Plan:   Principal Problem:   Intractable abdominal pain Active Problems:   Diabetes mellitus type 2 with complications, uncontrolled ([place])   Essential hypertension   Gastroparesis due to DM ([place])   ESRD on dialysis ([place])   Acute encephalopathy   1. Acute metabolic encephalopathy, ongoing- Pt has become increasingly combative and has become a danger to self and others.  I have started him on a precedex infusion which he has required in the past.  Hopefully we can have him weaned off in the next 24 hours.    Hopefully hemodialysis will help resolve his encephalopathy.  Urine drug screen ordered earlier, but patient not making much urine output. 2. Abdominal pain - CT abdomen with no abnormal findings to explain presentation or symptoms.  Follow.  Noted to have prior EGD 10/2014 with distal esophagitis noted at that time.  Plan to start on Protonix IV daily for now.  Continue on clear liquid diet for now and  advance as tolerated after hemodialysis. 3. ESRD on HD - Consult inpatient nephrology for hemodialysis treatment.   4. Hypokalemia-corrected with hemodialysis, per nephrology. 5. Type 2 diabetes mellitus with renal complications -continue current CBG monitoring and SSI coverage.  6. Essential hypertension - BPs continue to remain labile on Precedex infusion. 7. Bipolar disease - resume home geodon 8. Epilepsy - IV keppra as ordered.  9. Chronic toe wound - WOC consultation with with recommendations for daily dressing changes   DVT prophylaxis: Heparin to SCDs for thrombocytopenia Code Status: Full code Family Communication: None at bedside Disposition Plan:  Status is: Inpatient  Remains inpatient appropriate because:Altered mental status, IV treatments appropriate due to intensity of illness or inability to take PO and Inpatient level of care appropriate due to severity of illness   Dispo: The patient is from: Home              Anticipated d/c is to: Home              Anticipated d/c date is: 2 days              Patient currently is not medically stable to d/c.  Patient continues to remain encephalopathic and requires Precedex drip.  Consultants:   Nephrology  Procedures:   See below  Antimicrobials:  Anti-infectives (From admission, onward)   None       Subjective: Patient seen and evaluated today with periods of less agitation noted earlier in the evening, but he has required Precedex again due to his ongoing issues with agitation.  Objective: Vitals:   07/25/20 0200 07/25/20 0300 07/25/20 0400 07/25/20 0500  BP: (!) 202/100 (!) 212/83 (!) 156/64   Pulse: 68 92 70   Resp: 11 (!) 21 18   Temp:   99 F (37.2 C)   TempSrc:   Axillary   SpO2: 100% 100% 100%   Weight:    125 kg  Height:        Intake/Output Summary (Last 24 hours) at 07/25/2020 0659 Last data filed at 07/25/2020 0441 Gross per 24 hour  Intake 195.71 ml  Output --  Net 195.71 ml   Filed  Weights   07/23/20 1023 07/24/20 1305 07/25/20 0500  Weight: 129.3 kg 125.4 kg 125 kg    Examination:  General exam: Appears calm and comfortable, somnolent, obese Respiratory system: Clear to auscultation. Respiratory effort normal.  Currently on 2 L nasal cannula oxygen. Cardiovascular system: S1 & S2 heard, RRR. Gastrointestinal system: Abdomen is nondistended, soft and nontender. Central nervous system: Sleepy Extremities: No significant edema. Skin: No rashes, lesions or ulcers Psychiatry: Difficult to assess.    Data Reviewed: I have personally reviewed following labs and imaging studies  CBC: Recent Labs  Lab 07/23/20 1037 07/24/20 0341 07/25/20 0511  WBC 7.8 7.5 7.7  NEUTROABS  --   --  5.9  HGB 10.9* 10.0* 10.1*  HCT 32.7* 29.7* 29.8*  MCV 98.5 98.3 98.7  PLT 129* 136* 675*   Basic Metabolic Panel: Recent Labs  Lab 07/23/20 1037 07/24/20 0341 07/25/20 0511  NA 134* 134* 135  K 3.5 3.4* 3.0*  CL 95* 96* 97*  CO2 27 21* 25  GLUCOSE 139* 97 171*  BUN 36* 45* 51*  CREATININE 6.03* 7.26* 7.44*  CALCIUM 8.9 8.9 8.8*  MG  --  1.7 1.9  PHOS  --   --  3.8   GFR: Estimated Creatinine Clearance: 16 mL/min (A) (by C-G formula based on SCr of 7.44 mg/dL (H)). Liver Function Tests: Recent Labs  Lab 07/23/20 1037 07/25/20 0511  AST 39 25  ALT 15 14  ALKPHOS 42 36*  BILITOT 1.2 1.1  PROT 7.7 7.0  ALBUMIN 4.0 3.7   Recent Labs  Lab 07/23/20 1037  LIPASE 35   Recent Labs  Lab 07/23/20 1515  AMMONIA 21   Coagulation Profile: No results for input(s): INR, PROTIME in the last 168 hours. Cardiac Enzymes: No results for input(s): CKTOTAL, CKMB, CKMBINDEX, TROPONINI in the last 168 hours. BNP (last 3 results) No results for input(s): PROBNP in the last 8760 hours. HbA1C: No results for input(s): HGBA1C in the last 72 hours. CBG: Recent Labs  Lab 07/24/20 1146 07/24/20 1618 07/24/20 2007 07/25/20 0014 07/25/20 0529  GLUCAP 132* 92 164* 103*  177*   Lipid Profile: No results for input(s): CHOL, HDL, LDLCALC, TRIG, CHOLHDL, LDLDIRECT in the last 72 hours. Thyroid Function Tests: No results for input(s): TSH, T4TOTAL, FREET4, T3FREE, THYROIDAB in the last 72 hours. Anemia Panel: No results for input(s): VITAMINB12, FOLATE, FERRITIN, TIBC, IRON, RETICCTPCT in the last 72 hours. Sepsis Labs: No results for input(s): PROCALCITON, LATICACIDVEN in the last 168 hours.  Recent Results (from the past 240 hour(s))  SARS Coronavirus 2 by RT PCR (hospital order, performed in [facility] hospital lab)     Status: None   Collection Time: 07/23/20  4:12 PM  Result Value Ref Range Status   SARS Coronavirus 2 NEGATIVE NEGATIVE Final    Comment: (NOTE) SARS-CoV-2 target nucleic acids are NOT DETECTED.  The SARS-CoV-2  RNA is generally detectable in upper and lower respiratory specimens during the acute phase of infection. The lowest concentration of SARS-CoV-2 viral copies this assay can detect is 250 copies / mL. A negative result does not preclude SARS-CoV-2 infection and should not be used as the sole basis for treatment or other patient management decisions.  A negative result may occur with improper specimen collection / handling, submission of specimen other than nasopharyngeal swab, presence of viral mutation(s) within the areas targeted by this assay, and inadequate number of viral copies (<250 copies / mL). A negative result must be combined with clinical observations, patient history, and epidemiological information.  Fact Sheet for Patients:   StrictlyIdeas.no  Fact Sheet for Healthcare Providers: BankingDealers.co.za  This test is not yet approved or  cleared by the [place] FDA and has been authorized for detection and/or diagnosis of SARS-CoV-2 by FDA under an Emergency Use Authorization (EUA).  This EUA will remain in effect (meaning this test can be used) for the  duration of the COVID-19 declaration under Section 564(b)(1) of the Act, 21 U.S.C. section 360bbb-3(b)(1), unless the authorization is terminated or revoked sooner.  Performed at [facility], [address], [place], [place] [address]   MRSA PCR Screening     Status: None   Collection Time: 07/24/20  4:45 PM   Specimen: Nasopharyngeal  Result Value Ref Range Status   MRSA by PCR NEGATIVE NEGATIVE Final    Comment:        The GeneXpert MRSA Assay (FDA approved for NASAL specimens only), is one component of a comprehensive MRSA colonization surveillance program. It is not intended to diagnose MRSA infection nor to guide or monitor treatment for MRSA infections. Performed at [facility], [address], [place], [place] [address]          Radiology Studies: CT ABDOMEN WO CONTRAST  Result Date: 07/23/2020 CLINICAL DATA:  49 year old male with upper abdominal pain. EXAM: CT ABDOMEN WITHOUT CONTRAST TECHNIQUE: Multidetector CT imaging of the abdomen was performed following the standard protocol without IV contrast. COMPARISON:  CT abdomen pelvis dated 05/04/2020. FINDINGS: Evaluation of this exam is limited in the absence of intravenous contrast. Lower chest: The visualized lung bases are clear. There is borderline cardiomegaly. Coronary vascular calcifications. No intra-abdominal free air or free fluid. Hepatobiliary: The liver is unremarkable. No intrahepatic biliary ductal dilatation. The gallbladder is unremarkable. Pancreas: Unremarkable. No pancreatic ductal dilatation or surrounding inflammatory changes. Spleen: Normal in size without focal abnormality. Adrenals/Urinary Tract: The adrenal glands unremarkable. There is mild bilateral renal parenchyma atrophy. There is no hydronephrosis or nephrolithiasis on either side. Mild bilateral perinephric stranding, nonspecific. The visualized ureters are unremarkable. Stomach/Bowel: There is no bowel obstruction or active inflammation. The  visualized appendix appears normal. Vascular/Lymphatic: The abdominal aorta and IVC are grossly unremarkable on this noncontrast CT. No portal venous gas. No adenopathy. Other: Bilateral gynecomastia. Musculoskeletal: Degenerative changes of the spine. No acute osseous pathology. IMPRESSION: 1. No acute intra-abdominal pathology. 2. No hydronephrosis or nephrolithiasis. Electronically Signed   By: Anner Crete M.D.   On: 07/23/2020 22:56        Scheduled Meds: . amLODipine  5 mg Oral Daily  . carvedilol  25 mg Oral BID WC  . Chlorhexidine Gluconate Cloth  6 each Topical Daily  . cloNIDine  0.2 mg Oral BID  . divalproex  250 mg Oral Daily  . heparin  5,000 Units Subcutaneous Q8H  . insulin aspart  0-9 Units Subcutaneous Q4H  .  polymixin-bacitracin   Topical Daily  . sucralfate  1 g Oral TID WC & HS  . ziprasidone  60 mg Oral BID WC   Continuous Infusions: . dexmedetomidine 0.5 mcg/kg/hr (07/25/20 0441)  . levETIRAcetam 500 mg (07/25/20 0235)     LOS: 1 day    Time spent: 35 minutes    Aadvika Konen D Manuella Ghazi, DO [facility]  If 7PM-7AM, please contact night-coverage [email or web address] 07/25/2020, 6:59 AM

## 2020-07-25 NOTE — Progress Notes (Signed)
Patient became agitated and restless and would not converse or follow commands. 4 pt Restraints reapplied Medication restarted (see MAR/flowsheet) for titration

## 2020-07-26 LAB — GLUCOSE, CAPILLARY
Glucose-Capillary: 88 mg/dL (ref 70–99)
Glucose-Capillary: 93 mg/dL (ref 70–99)
Glucose-Capillary: 178 mg/dL — ABNORMAL HIGH (ref 70–99)

## 2020-07-26 LAB — COMPREHENSIVE METABOLIC PANEL
ALT: 14 U/L (ref 0–44)
AST: 18 U/L (ref 15–41)
Albumin: 3.8 g/dL (ref 3.5–5.0)
Alkaline Phosphatase: 39 U/L (ref 38–126)
Anion gap: 12 (ref 5–15)
BUN: 32 mg/dL — ABNORMAL HIGH (ref 6–20)
CO2: 26 mmol/L (ref 22–32)
Calcium: 8.8 mg/dL — ABNORMAL LOW (ref 8.9–10.3)
Chloride: 96 mmol/L — ABNORMAL LOW (ref 98–111)
Creatinine, Ser: 5.7 mg/dL — ABNORMAL HIGH (ref 0.61–1.24)
GFR, Estimated: 11 mL/min — ABNORMAL LOW (ref >60)
Glucose, Bld: 89 mg/dL (ref 70–99)
Potassium: 2.9 mmol/L — ABNORMAL LOW (ref 3.5–5.1)
Sodium: 134 mmol/L — ABNORMAL LOW (ref 135–145)
Total Bilirubin: 0.6 mg/dL (ref 0.3–1.2)
Total Protein: 7.6 g/dL (ref 6.5–8.1)

## 2020-07-26 LAB — MAGNESIUM: Magnesium: 2.1 mg/dL (ref 1.7–2.4)

## 2020-07-26 LAB — PHOSPHORUS: Phosphorus: 3.1 mg/dL (ref 2.5–4.6)

## 2020-07-26 MED ORDER — POTASSIUM CHLORIDE CRYS ER 20 MEQ PO TBCR
40.0000 meq | EXTENDED_RELEASE_TABLET | Freq: Once | ORAL | Status: AC
  Administered 2020-07-26: 40 meq via ORAL
  Filled 2020-07-26: qty 2

## 2020-07-26 MED ORDER — MELATONIN 3 MG PO TABS
6.0000 mg | ORAL_TABLET | Freq: Every evening | ORAL | Status: DC | PRN
  Administered 2020-07-26: 6 mg via ORAL
  Filled 2020-07-26: qty 2

## 2020-07-26 NOTE — Discharge Summary (Addendum)
Physician Discharge Summary  Timothy Clark LOV:[id number] DOB: 1971/05/26 DOA: 07/23/2020  PCP: [name], MD  Admit date: 07/23/2020  Discharge date: 07/26/2020  Admitted From:Home  Disposition:  Home  Recommendations for Outpatient Follow-up:  1. Follow up with PCP in 1-2 weeks 2. Continue at home medications as prior 3. Continue hemodialysis sessions with next session on 12/17 4. Patient seen by [facility] gastroenterology 12/10. Continue on Protonix, Linzess, and Reglan as prescribed with upcoming colonoscopy noted. Please follow-up as scheduled.  Home Health:Yes with PT, RN, SW  Equipment/Devices:None  Discharge Condition:Stable  CODE STATUS: Full  Diet recommendation: Renal/carb modified  Brief/Interim Summary: 50 y.o.malewith medical history significant forESRD, hypertension, diabetes mellitus, CHF, gastroparesis, dumping syndrome. Patient presented to the ED with complaints of abdominal pain that started this morning.Patient went for his dialysis, when he started having abdominal pain with vomiting, confused and combative. Patient had 2 Hrs leftto go on his dialysis,when it was stopped and he was sent to ED via EMS. Per EMS patient was initially cooperative then became combative.He had an admission in July 2021 with similar presentation of acute encephalopathy that resolved after HD treatment and temporary precedex infusion treatment. His abdominal pain work up has been unrevealing. Unfortunately he has become increasing combative, agitated and encephalopathic and has become a danger to self and others. He was placed on a precedex infusion.   -Patient was admitted with some abdominal pain and acute encephalopathy in the setting of inability to ingest his home psychiatric medications.  He is now off Precedex infusion and overall doing well with his usual home medications.  He is tolerating diet and is stable for discharge.  He has had hemodialysis last on  12/15 with further hemodialysis to take place on 12/17.  He will need close follow-up with home health agencies that could focus on his psychiatric medications and likely convert them to injectable forms so that he does not become as agitated if he is not able to tolerate those medications.  No other acute events noted throughout the course of this admission.   Discharge Diagnoses:  Principal Problem:   Intractable abdominal pain Active Problems:   Diabetes mellitus type 2 with complications, uncontrolled (HCC)   Essential hypertension   Gastroparesis due to DM ([place])   ESRD on dialysis [facility])   Acute encephalopathy    Discharge Instructions  Discharge Instructions    Diet - low sodium heart healthy   Complete by: As directed    Discharge wound care:   Complete by: As directed    Daily dressing changes.   Increase activity slowly   Complete by: As directed      Allergies as of 07/26/2020      Reactions   Donnatal [phenobarbital-belladonna Alk] Anaphylaxis, Other (See Comments)   Reaction to GI cocktail   Fish Allergy Anaphylaxis, Hives, Rash   Haldol [haloperidol Lactate] Other (See Comments)   Chest Pain   Lidocaine Anaphylaxis, Other (See Comments)   Reaction to GI cocktail   Maalox [calcium Carbonate Antacid] Anaphylaxis, Other (See Comments)   Reaction to GI cocktail   Shellfish Allergy Hives   Per patient: Patient stated he breaks out in hives when eating shellfish   Aspirin Hives, Itching   Doxycycline Hives, Nausea And Vomiting, Other (See Comments)   Severe    Keflex [cephalexin] Hives, Itching, Nausea And Vomiting   Marinol [dronabinol] Nausea And Vomiting, Other (See Comments)   Caused worsening vomiting.    Other Swelling   Penicillins  Hives, Itching, Other (See Comments), Rash   Has patient had a PCN reaction causing immediate rash, facial/tongue/throat swelling, SOB or lightheadedness with hypotension: yes Has patient had a PCN reaction causing severe rash  involving mucus membranes or skin necrosis: yes Has patient had a PCN reaction that required hospitalization no Has patient had a PCN reaction occurring within the last 10 years: yes If all of the above answers are "NO", then may proceed with Cephalospor   Bactrim [sulfamethoxazole-trimethoprim] Itching      Medication List    TAKE these medications   amLODipine 5 MG tablet Commonly known as: NORVASC Take 5 mg by mouth daily.   atorvastatin 10 MG tablet Commonly known as: LIPITOR Take 10 mg by mouth at bedtime.   brimonidine 0.15 % ophthalmic solution Commonly known as: ALPHAGAN Place 1 drop into both eyes in the morning and at bedtime.   carvedilol 25 MG tablet Commonly known as: COREG Take 25 mg by mouth 2 (two) times daily with a meal.   cloNIDine 0.2 MG tablet Commonly known as: CATAPRES Take 0.2 mg by mouth 2 (two) times daily.   cyanocobalamin 1000 MCG/ML injection Commonly known as: (VITAMIN B-12) Inject 1,000 mcg into the muscle every 30 (thirty) days.   divalproex 250 MG DR tablet Commonly known as: DEPAKOTE Take 250 mg by mouth daily.   dorzolamide-timolol 22.3-6.8 MG/ML ophthalmic solution Commonly known as: COSOPT Place 1 drop into both eyes 2 (two) times daily.   famotidine 20 MG tablet Commonly known as: PEPCID Take by mouth.   folic acid 1 MG tablet Commonly known as: FOLVITE Take 1 mg by mouth daily.   Leader Unifine Pentips Plus 32G X 4 MM Misc Generic drug: Insulin Pen Needle   levETIRAcetam 500 MG tablet Commonly known as: KEPPRA Take 500 mg by mouth 2 (two) times daily.   metoCLOPramide 5 MG tablet Commonly known as: REGLAN Take 1 tablet (5 mg total) by mouth every 12 (twelve) hours.   NovoLOG Mix 70/30 FlexPen (70-30) 100 UNIT/ML FlexPen Generic drug: insulin aspart protamine - aspart 35-55 Units by Subconjunctival route See admin instructions. Inject 55 units subcutaneously in the morning & inject 35 units subcutaneously in the  evening.   ondansetron 4 MG tablet Commonly known as: ZOFRAN Take 4 mg by mouth every 8 (eight) hours as needed for nausea or vomiting.   pantoprazole 40 MG tablet Commonly known as: PROTONIX Take 1 tablet (40 mg total) by mouth 2 (two) times daily before a meal.   prochlorperazine 10 MG tablet Commonly known as: COMPAZINE Take by mouth.   sertraline 100 MG tablet Commonly known as: ZOLOFT Take 100 mg by mouth daily.   sitaGLIPtin 25 MG tablet Commonly known as: JANUVIA Take 25 mg by mouth daily.   sucralfate 1 g tablet Commonly known as: CARAFATE Take 1 g by mouth in the morning, at noon, and at bedtime.   torsemide 20 MG tablet Commonly known as: DEMADEX Take 40 mg by mouth daily.   ziprasidone 60 MG capsule Commonly known as: GEODON Take 60 mg by mouth 2 (two) times daily with a meal.            Discharge Care Instructions  (From admission, onward)         Start     Ordered   07/26/20 0000  Discharge wound care:       Comments: Daily dressing changes.   07/26/20 1028          Follow-up  Information    [name], MD Follow up in 1 week(s).   Specialty: Internal Medicine Contact information: [place] [address] [phone]              Allergies  Allergen Reactions  . Donnatal [Phenobarbital-Belladonna Alk] Anaphylaxis and Other (See Comments)    Reaction to GI cocktail  . Fish Allergy Anaphylaxis, Hives and Rash  . Haldol [Haloperidol Lactate] Other (See Comments)    Chest Pain  . Lidocaine Anaphylaxis and Other (See Comments)    Reaction to GI cocktail  . Maalox [Calcium Carbonate Antacid] Anaphylaxis and Other (See Comments)    Reaction to GI cocktail  . Shellfish Allergy Hives    Per patient: Patient stated he breaks out in hives when eating shellfish  . Aspirin Hives and Itching  . Doxycycline Hives, Nausea And Vomiting and Other (See Comments)    Severe   . Keflex [Cephalexin] Hives, Itching and Nausea  And Vomiting  . Marinol [Dronabinol] Nausea And Vomiting and Other (See Comments)    Caused worsening vomiting.   . Other Swelling  . Penicillins Hives, Itching, Other (See Comments) and Rash    Has patient had a PCN reaction causing immediate rash, facial/tongue/throat swelling, SOB or lightheadedness with hypotension: yes Has patient had a PCN reaction causing severe rash involving mucus membranes or skin necrosis: yes Has patient had a PCN reaction that required hospitalization no Has patient had a PCN reaction occurring within the last 10 years: yes If all of the above answers are "NO", then may proceed with Cephalospor  . Bactrim [Sulfamethoxazole-Trimethoprim] Itching    Consultations:  Nephrology   Procedures/Studies: CT ABDOMEN WO CONTRAST  Result Date: 07/23/2020 CLINICAL DATA:  49 year old male with upper abdominal pain. EXAM: CT ABDOMEN WITHOUT CONTRAST TECHNIQUE: Multidetector CT imaging of the abdomen was performed following the standard protocol without IV contrast. COMPARISON:  CT abdomen pelvis dated 05/04/2020. FINDINGS: Evaluation of this exam is limited in the absence of intravenous contrast. Lower chest: The visualized lung bases are clear. There is borderline cardiomegaly. Coronary vascular calcifications. No intra-abdominal free air or free fluid. Hepatobiliary: The liver is unremarkable. No intrahepatic biliary ductal dilatation. The gallbladder is unremarkable. Pancreas: Unremarkable. No pancreatic ductal dilatation or surrounding inflammatory changes. Spleen: Normal in size without focal abnormality. Adrenals/Urinary Tract: The adrenal glands unremarkable. There is mild bilateral renal parenchyma atrophy. There is no hydronephrosis or nephrolithiasis on either side. Mild bilateral perinephric stranding, nonspecific. The visualized ureters are unremarkable. Stomach/Bowel: There is no bowel obstruction or active inflammation. The visualized appendix appears normal.  Vascular/Lymphatic: The abdominal aorta and IVC are grossly unremarkable on this noncontrast CT. No portal venous gas. No adenopathy. Other: Bilateral gynecomastia. Musculoskeletal: Degenerative changes of the spine. No acute osseous pathology. IMPRESSION: 1. No acute intra-abdominal pathology. 2. No hydronephrosis or nephrolithiasis. Electronically Signed   By: Anner Crete M.D.   On: 07/23/2020 22:56   VAS US DUPLEX DIALYSIS ACCESS (AVF,AVG)  Result Date: 07/19/2020 DIALYSIS ACCESS Access Site: Left Upper Extremity. Access Type: Brachial Ax AVG. History: Created New left BrachioCephalic after failed forearm loop 03/21/2020. Comparison Study: 04/10/2020 Performing Technologist: Almira Coaster RVS  Examination Guidelines: A complete evaluation includes B-mode imaging, spectral Doppler, color Doppler, and power Doppler as needed of all accessible portions of each vessel. Unilateral testing is considered an integral part of a complete examination. Limited examinations for reoccurring indications may be performed as noted.  Findings:   +--------------------+----------+-----------------+--------+ AVG  PSV (cm/s)Flow Vol (mL/min)Describe +--------------------+----------+-----------------+--------+ Native artery inflow   166          1123                +--------------------+----------+-----------------+--------+ Arterial anastomosis   329                              +--------------------+----------+-----------------+--------+ Prox graft             470                              +--------------------+----------+-----------------+--------+ Mid graft              182                              +--------------------+----------+-----------------+--------+ Distal graft           131                              +--------------------+----------+-----------------+--------+ Venous anastomosis     365                               +--------------------+----------+-----------------+--------+ Venous outflow         122                              +--------------------+----------+-----------------+--------+ +--------------+-------------+---------+---------+----------+------------------+               Diameter (cm)  Depth  BranchingPSV (cm/s)   Flow Volume                                  (cm)                           (ml/min)      +--------------+-------------+---------+---------+----------+------------------+ Lt Rad Art                                       46                       Dist                                                                      +--------------+-------------+---------+---------+----------+------------------+  Summary: The Left Brachial Axillary AVG appears to be patent throughout; Flow Volume appears to be Normal.  *See table(s) above for measurements and observations.  Diagnosing physician: [name] MD Electronically signed by [name] MD on 07/19/2020 at 5:47:20 PM.    --------------------------------------------------------------------------------   Final    OCT, Retina - OU - Both Eyes  Result Date: 07/03/2020 Right Eye Quality was good. Scan locations included subfoveal. Central Foveal Thickness: 281. Left Eye Quality was good. Scan locations included subfoveal. Central  Foveal Thickness: 356. Notes Minor CME perifoveal OS on temporal margin, small, chronic    Discharge Exam: Vitals:   07/26/20 0921 07/26/20 1000  BP: (!) 159/53 119/81  Pulse:    Resp:  (!) 8  Temp:    SpO2:     Vitals:   07/26/20 0800 07/26/20 0900 07/26/20 0921 07/26/20 1000  BP: (!) 109/43 126/70 (!) 159/53 119/81  Pulse:      Resp: 13 12  (!) 8  Temp: 98.5 F (36.9 C)     TempSrc: Oral     SpO2:      Weight:      Height:        General: Pt is alert, awake, not in acute distress Cardiovascular: RRR, S1/S2 +, no rubs, no gallops Respiratory: CTA bilaterally, no  wheezing, no rhonchi Abdominal: Soft, NT, ND, bowel sounds + Extremities: no edema, no cyanosis    The results of significant diagnostics from this hospitalization (including imaging, microbiology, ancillary and laboratory) are listed below for reference.     Microbiology: Recent Results (from the past 240 hour(s))  SARS Coronavirus 2 by RT PCR (hospital order, performed in [place] hospital lab)     Status: None   Collection Time: 07/23/20  4:12 PM  Result Value Ref Range Status   SARS Coronavirus 2 NEGATIVE NEGATIVE Final    Comment: (NOTE) SARS-CoV-2 target nucleic acids are NOT DETECTED.  The SARS-CoV-2 RNA is generally detectable in upper and lower respiratory specimens during the acute phase of infection. The lowest concentration of SARS-CoV-2 viral copies this assay can detect is 250 copies / mL. A negative result does not preclude SARS-CoV-2 infection and should not be used as the sole basis for treatment or other patient management decisions.  A negative result may occur with improper specimen collection / handling, submission of specimen other than nasopharyngeal swab, presence of viral mutation(s) within the areas targeted by this assay, and inadequate number of viral copies (<250 copies / mL). A negative result must be combined with clinical observations, patient history, and epidemiological information.  Fact Sheet for Patients:   StrictlyIdeas.no  Fact Sheet for Healthcare Providers: BankingDealers.co.za  This test is not yet approved or  cleared by the [place] FDA and has been authorized for detection and/or diagnosis of SARS-CoV-2 by FDA under an Emergency Use Authorization (EUA).  This EUA will remain in effect (meaning this test can be used) for the duration of the COVID-19 declaration under Section 564(b)(1) of the Act, 21 U.S.C. section 360bbb-3(b)(1), unless the authorization is terminated or revoked  sooner.  Performed at [facility], [address], [place], [place] [address]   MRSA PCR Screening     Status: None   Collection Time: 07/24/20  4:45 PM   Specimen: Nasopharyngeal  Result Value Ref Range Status   MRSA by PCR NEGATIVE NEGATIVE Final    Comment:        The GeneXpert MRSA Assay (FDA approved for NASAL specimens only), is one component of a comprehensive MRSA colonization surveillance program. It is not intended to diagnose MRSA infection nor to guide or monitor treatment for MRSA infections. Performed at [facility], [address] [place], [place] [address]      Labs: BNP (last 3 results) No results for input(s): BNP in the last 8760 hours. Basic Metabolic Panel: Recent Labs  Lab 07/23/20 1037 07/24/20 0341 07/25/20 0511 07/26/20 0401  NA 134* 134* 135 134*  K 3.5 3.4* 3.0* 2.9*  CL  95* 96* 97* 96*  CO2 27 21* 25 26  GLUCOSE 139* 97 171* 89  BUN 36* 45* 51* 32*  CREATININE 6.03* 7.26* 7.44* 5.70*  CALCIUM 8.9 8.9 8.8* 8.8*  MG  --  1.7 1.9 2.1  PHOS  --   --  3.8 3.1   Liver Function Tests: Recent Labs  Lab 07/23/20 1037 07/25/20 0511 07/26/20 0401  AST 39 25 18  ALT '15 14 14  ' ALKPHOS 42 36* 39  BILITOT 1.2 1.1 0.6  PROT 7.7 7.0 7.6  ALBUMIN 4.0 3.7 3.8   Recent Labs  Lab 07/23/20 1037  LIPASE 35   Recent Labs  Lab 07/23/20 1515  AMMONIA 21   CBC: Recent Labs  Lab 07/23/20 1037 07/24/20 0341 07/25/20 0511  WBC 7.8 7.5 7.7  NEUTROABS  --   --  5.9  HGB 10.9* 10.0* 10.1*  HCT 32.7* 29.7* 29.8*  MCV 98.5 98.3 98.7  PLT 129* 136* 123*   Cardiac Enzymes: No results for input(s): CKTOTAL, CKMB, CKMBINDEX, TROPONINI in the last 168 hours. BNP: Invalid input(s): POCBNP CBG: Recent Labs  Lab 07/25/20 1630 07/25/20 1936 07/25/20 2302 07/26/20 0334 07/26/20 0807  GLUCAP 110* 184* 216* 88 93   D-Dimer No results for input(s): DDIMER in the last 72 hours. Hgb A1c No results for input(s): HGBA1C in the last 72  hours. Lipid Profile No results for input(s): CHOL, HDL, LDLCALC, TRIG, CHOLHDL, LDLDIRECT in the last 72 hours. Thyroid function studies No results for input(s): TSH, T4TOTAL, T3FREE, THYROIDAB in the last 72 hours.  Invalid input(s): FREET3 Anemia work up No results for input(s): VITAMINB12, FOLATE, FERRITIN, TIBC, IRON, RETICCTPCT in the last 72 hours. Urinalysis    Component Value Date/Time   COLORURINE YELLOW 10/24/2018 1424   APPEARANCEUR CLEAR 10/24/2018 1424   LABSPEC 1.014 10/24/2018 1424   PHURINE 5.0 10/24/2018 1424   GLUCOSEU >=500 (A) 10/24/2018 1424   HGBUR MODERATE (A) 10/24/2018 1424   BILIRUBINUR NEGATIVE 10/24/2018 1424   KETONESUR NEGATIVE 10/24/2018 1424   PROTEINUR >=300 (A) 10/24/2018 1424   UROBILINOGEN 0.2 06/07/2015 1353   NITRITE NEGATIVE 10/24/2018 1424   LEUKOCYTESUR NEGATIVE 10/24/2018 1424   Sepsis Labs Invalid input(s): PROCALCITONIN,  WBC,  LACTICIDVEN Microbiology Recent Results (from the past 240 hour(s))  SARS Coronavirus 2 by RT PCR (hospital order, performed in [place] hospital lab)     Status: None   Collection Time: 07/23/20  4:12 PM  Result Value Ref Range Status   SARS Coronavirus 2 NEGATIVE NEGATIVE Final    Comment: (NOTE) SARS-CoV-2 target nucleic acids are NOT DETECTED.  The SARS-CoV-2 RNA is generally detectable in upper and lower respiratory specimens during the acute phase of infection. The lowest concentration of SARS-CoV-2 viral copies this assay can detect is 250 copies / mL. A negative result does not preclude SARS-CoV-2 infection and should not be used as the sole basis for treatment or other patient management decisions.  A negative result may occur with improper specimen collection / handling, submission of specimen other than nasopharyngeal swab, presence of viral mutation(s) within the areas targeted by this assay, and inadequate number of viral copies (<250 copies / mL). A negative result must be combined with  clinical observations, patient history, and epidemiological information.  Fact Sheet for Patients:   StrictlyIdeas.no  Fact Sheet for Healthcare Providers: BankingDealers.co.za  This test is not yet approved or  cleared by the [place] FDA and has been authorized for detection and/or diagnosis of SARS-CoV-2  by FDA under an Emergency Use Authorization (EUA).  This EUA will remain in effect (meaning this test can be used) for the duration of the COVID-19 declaration under Section 564(b)(1) of the Act, 21 U.S.C. section 360bbb-3(b)(1), unless the authorization is terminated or revoked sooner.  Performed at [facility], [address], [place], [place] [address]   MRSA PCR Screening     Status: None   Collection Time: 07/24/20  4:45 PM   Specimen: Nasopharyngeal  Result Value Ref Range Status   MRSA by PCR NEGATIVE NEGATIVE Final    Comment:        The GeneXpert MRSA Assay (FDA approved for NASAL specimens only), is one component of a comprehensive MRSA colonization surveillance program. It is not intended to diagnose MRSA infection nor to guide or monitor treatment for MRSA infections. Performed at [facility], [address] [place], [place] [address]      Time coordinating discharge: 35 minutes  SIGNED:   Rodena Goldmann, DO [facility] 07/26/2020, 10:29 AM  If 7PM-7AM, please contact night-coverage [email or web address]

## 2020-07-26 NOTE — Progress Notes (Signed)
  [facility] Progress Note    Assessment/ Plan:   1.  Acute metabolic encephalopathy- improved with precedex, now off.  Unclear etiology, has required Precedex in the past.  2. Abdominal pain- CT scan without abnormal findings.  Plan per primary 3.  ESRD -  Plan to continue with HD on MWF schedule.  Can resume his outpatient dialysis schedule once discharged. 4.  Hypertension/volume  - stable 5.  Anemia  - continue with ESA 6.  Metabolic bone disease -  Continue with home meds when able to take po 7.  Nutrition - per primary 8. Bipolar disorder- continue with home meds 9. Seizure disorder- on IV Keppra. 10. DM- per primary   Subjective:   Doing well, tolerated dialysis yesterday with a net UF of 1 L.  Will be discharged today given that his mentation is significantly improved and off Precedex.   Objective:   BP (!) 159/53   Pulse 62   Temp 98.5 F (36.9 C) (Oral)   Resp 13   Ht 5\' 11"  (1.803 m)   Wt 125.5 kg   SpO2 100%   BMI 38.59 kg/m   Intake/Output Summary (Last 24 hours) at 07/26/2020 5009 Last data filed at 07/26/2020 0801 Gross per 24 hour  Intake 591.1 ml  Output 1378 ml  Net -786.9 ml   Weight change: 1.1 kg  Physical Exam: Gen:nad CVS:rrr Resp:normal wob FGH:WEXH, nt Ext:no edema Access: lue avf +b/t  Imaging: No results found.  Labs: BMET Recent Labs  Lab 07/23/20 1037 07/24/20 0341 07/25/20 0511 07/26/20 0401  NA 134* 134* 135 134*  K 3.5 3.4* 3.0* 2.9*  CL 95* 96* 97* 96*  CO2 27 21* 25 26  GLUCOSE 139* 97 171* 89  BUN 36* 45* 51* 32*  CREATININE 6.03* 7.26* 7.44* 5.70*  CALCIUM 8.9 8.9 8.8* 8.8*  PHOS  --   --  3.8 3.1   CBC Recent Labs  Lab 07/23/20 1037 07/24/20 0341 07/25/20 0511  WBC 7.8 7.5 7.7  NEUTROABS  --   --  5.9  HGB 10.9* 10.0* 10.1*  HCT 32.7* 29.7* 29.8*  MCV 98.5 98.3 98.7  PLT 129* 136* 123*    Medications:    . amLODipine  5 mg Oral Daily  . carvedilol  25 mg Oral BID WC  .  Chlorhexidine Gluconate Cloth  6 each Topical Daily  . Chlorhexidine Gluconate Cloth  6 each Topical Q0600  . cloNIDine  0.2 mg Oral BID  . divalproex  250 mg Oral Daily  . insulin aspart  0-9 Units Subcutaneous Q4H  . pantoprazole (PROTONIX) IV  40 mg Intravenous Q24H  . polymixin-bacitracin   Topical Daily  . sucralfate  1 g Oral TID WC & HS  . ziprasidone  60 mg Oral BID WC      Gean Quint, MD [facility] 07/26/2020, 9:38 AM

## 2020-07-27 ENCOUNTER — Other Ambulatory Visit: Payer: Self-pay

## 2020-07-27 DIAGNOSIS — E1122 Type 2 diabetes mellitus with diabetic chronic kidney disease: Secondary | ICD-10-CM | POA: Diagnosis not present

## 2020-07-27 DIAGNOSIS — N186 End stage renal disease: Secondary | ICD-10-CM | POA: Diagnosis not present

## 2020-07-27 DIAGNOSIS — Z7984 Long term (current) use of oral hypoglycemic drugs: Secondary | ICD-10-CM | POA: Diagnosis not present

## 2020-07-27 DIAGNOSIS — I509 Heart failure, unspecified: Secondary | ICD-10-CM | POA: Diagnosis not present

## 2020-07-27 DIAGNOSIS — I132 Hypertensive heart and chronic kidney disease with heart failure and with stage 5 chronic kidney disease, or end stage renal disease: Secondary | ICD-10-CM | POA: Diagnosis not present

## 2020-07-27 DIAGNOSIS — Z992 Dependence on renal dialysis: Secondary | ICD-10-CM | POA: Diagnosis not present

## 2020-07-27 DIAGNOSIS — Z794 Long term (current) use of insulin: Secondary | ICD-10-CM | POA: Diagnosis not present

## 2020-07-27 DIAGNOSIS — G9341 Metabolic encephalopathy: Secondary | ICD-10-CM | POA: Diagnosis not present

## 2020-07-27 NOTE — Patient Outreach (Signed)
[facility]) Care Management  07/27/2020  Timothy Clark [birth year] [id number]     Transition of Care Referral  Referral Date: 07/27/2020 Referral Source: [facility] Discharge Report Date of Discharge: 07/26/2020 Facility: [place]: [facility] Medicare    Referral received. Transition of care calls being completed via EMMI-automated calls. RN CM will outreach patient for any red flags received.     Plan: RN CM will close case at this time.    Enzo Montgomery, RN,BSN,CCM [facility] Telephonic Care Management Coordinator Direct Phone: [phone] Toll Free: [phone] Fax: [phone]

## 2020-07-30 ENCOUNTER — Other Ambulatory Visit: Payer: Self-pay

## 2020-07-30 ENCOUNTER — Ambulatory Visit: Payer: Medicare HMO | Admitting: Podiatry

## 2020-07-30 DIAGNOSIS — L97522 Non-pressure chronic ulcer of other part of left foot with fat layer exposed: Secondary | ICD-10-CM

## 2020-07-30 DIAGNOSIS — Z992 Dependence on renal dialysis: Secondary | ICD-10-CM | POA: Diagnosis not present

## 2020-07-30 DIAGNOSIS — E0843 Diabetes mellitus due to underlying condition with diabetic autonomic (poly)neuropathy: Secondary | ICD-10-CM

## 2020-07-30 DIAGNOSIS — N186 End stage renal disease: Secondary | ICD-10-CM | POA: Diagnosis not present

## 2020-07-30 NOTE — Progress Notes (Signed)
Subjective:  49 y.o. male with PMHx of diabetes mellitus, ESRD, presenting for follow-up evaluation of an ulcer that is developed to the left hallux. Patient states that the nurse comes out weekly to change the dressings. He states that the ulcer to the subfifth MTPJ has healed completely. The nurse continues to apply silver alginate to the left hallux wound. He also presents for a routine diabetic foot exam  Past Medical History:  Diagnosis Date  . Anemia   . Anxiety   . CHF (congestive heart failure) (HCC)    diastolic dysfunction  . Chronic abdominal pain   . COVID-19 07/2019  . Depression   . Esophagitis   . ESRD on hemodialysis [facility])    undergoing evaluation for transplant  . Eye hemorrhage   . Gastritis   . Gastroparesis   . GERD (gastroesophageal reflux disease)   . Glaucoma   . Helicobacter pylori (H. pylori) infection 2016   ERADICATION DOCUMENTED VIA STOOL TEST in AUG 2016 at [place]  . Hiccups   . Hypertension   . Nausea and vomiting    chronic, recurrent  . Neuropathy    feet  . Renal insufficiency   . Type 2 diabetes mellitus with complications (HCC)    diagnosed around age 1    Objective/Physical Exam General: The patient is alert and oriented x3 in no acute distress.  Dermatology:  Wound #1 noted to the left hallux measuring approximately 0.5x0.5 x 0.2 cm (LxWxD).   To the noted ulceration(s), there is no eschar. There is a moderate amount of slough, fibrin, and necrotic tissue noted. Granulation tissue and wound base is red. There is a minimal amount of serosanguineous drainage noted. There is no exposed bone muscle-tendon ligament or joint. There is no malodor. Periwound integrity is intact. Skin is warm, dry and supple bilateral lower extremities.  Thickened, hyperkeratotic, dystrophic, elongated nails also noted bilateral  Vascular: Palpable pedal pulses bilaterally. No edema or erythema noted. Capillary refill within normal limits.  Neurological:  Epicritic and protective threshold diminished bilaterally.   Musculoskeletal Exam: History of partial first ray amputation right foot  Assessment: 1.  Ulcer left hallux and subfifth MTPJ left secondary to diabetes mellitus 2. diabetes mellitus w/ peripheral neuropathy 3. H/o partial first ray amputation right foot   Plan of Care:  1. Patient was evaluated. Comprehensive diabetic foot exam was performed today 2. medically necessary excisional debridement including subcutaneous tissue was performed using a tissue nipper and a chisel blade. Excisional debridement of all the necrotic nonviable tissue down to healthy bleeding viable tissue was performed with post-debridement measurements same as pre-. 3. the wound was cleansed and dry sterile dressing applied. 4.  Continue home health dressing changes weekly  5. Continue weightbearing in postoperative shoe 6.  Offloading felt dancers pads were applied to the insoles of the shoes to offload pressure from the left hallux 7.  Appointment with Pedorthist for new diabetic insoles and shoes 8.  Return to clinic in 3 weeks  Edrick Kins, DPM [facility]  Dr. Edrick Kins, DPM    [address], [place] [address]                Office [phone]  Fax [phone]

## 2020-08-01 DIAGNOSIS — Z992 Dependence on renal dialysis: Secondary | ICD-10-CM | POA: Diagnosis not present

## 2020-08-01 DIAGNOSIS — N186 End stage renal disease: Secondary | ICD-10-CM | POA: Diagnosis not present

## 2020-08-03 DIAGNOSIS — N186 End stage renal disease: Secondary | ICD-10-CM | POA: Diagnosis not present

## 2020-08-03 DIAGNOSIS — Z992 Dependence on renal dialysis: Secondary | ICD-10-CM | POA: Diagnosis not present

## 2020-08-06 DIAGNOSIS — Z992 Dependence on renal dialysis: Secondary | ICD-10-CM | POA: Diagnosis not present

## 2020-08-06 DIAGNOSIS — I132 Hypertensive heart and chronic kidney disease with heart failure and with stage 5 chronic kidney disease, or end stage renal disease: Secondary | ICD-10-CM | POA: Diagnosis not present

## 2020-08-06 DIAGNOSIS — N186 End stage renal disease: Secondary | ICD-10-CM | POA: Diagnosis not present

## 2020-08-06 DIAGNOSIS — I509 Heart failure, unspecified: Secondary | ICD-10-CM | POA: Diagnosis not present

## 2020-08-06 DIAGNOSIS — E1122 Type 2 diabetes mellitus with diabetic chronic kidney disease: Secondary | ICD-10-CM | POA: Diagnosis not present

## 2020-08-06 DIAGNOSIS — G9341 Metabolic encephalopathy: Secondary | ICD-10-CM | POA: Diagnosis not present

## 2020-08-06 DIAGNOSIS — E785 Hyperlipidemia, unspecified: Secondary | ICD-10-CM | POA: Diagnosis not present

## 2020-08-06 DIAGNOSIS — E119 Type 2 diabetes mellitus without complications: Secondary | ICD-10-CM | POA: Diagnosis not present

## 2020-08-07 DIAGNOSIS — N183 Chronic kidney disease, stage 3 unspecified: Secondary | ICD-10-CM | POA: Diagnosis not present

## 2020-08-07 DIAGNOSIS — E782 Mixed hyperlipidemia: Secondary | ICD-10-CM | POA: Diagnosis not present

## 2020-08-07 DIAGNOSIS — E1122 Type 2 diabetes mellitus with diabetic chronic kidney disease: Secondary | ICD-10-CM | POA: Diagnosis not present

## 2020-08-07 DIAGNOSIS — K5904 Chronic idiopathic constipation: Secondary | ICD-10-CM | POA: Diagnosis not present

## 2020-08-07 DIAGNOSIS — I10 Essential (primary) hypertension: Secondary | ICD-10-CM | POA: Diagnosis not present

## 2020-08-08 DIAGNOSIS — N186 End stage renal disease: Secondary | ICD-10-CM | POA: Diagnosis not present

## 2020-08-08 DIAGNOSIS — Z992 Dependence on renal dialysis: Secondary | ICD-10-CM | POA: Diagnosis not present

## 2020-08-10 DIAGNOSIS — N186 End stage renal disease: Secondary | ICD-10-CM | POA: Diagnosis not present

## 2020-08-10 DIAGNOSIS — Z992 Dependence on renal dialysis: Secondary | ICD-10-CM | POA: Diagnosis not present

## 2020-08-13 DIAGNOSIS — Z992 Dependence on renal dialysis: Secondary | ICD-10-CM | POA: Diagnosis not present

## 2020-08-13 DIAGNOSIS — N186 End stage renal disease: Secondary | ICD-10-CM | POA: Diagnosis not present

## 2020-08-14 DIAGNOSIS — N186 End stage renal disease: Secondary | ICD-10-CM | POA: Diagnosis not present

## 2020-08-14 DIAGNOSIS — I132 Hypertensive heart and chronic kidney disease with heart failure and with stage 5 chronic kidney disease, or end stage renal disease: Secondary | ICD-10-CM | POA: Diagnosis not present

## 2020-08-14 DIAGNOSIS — E1122 Type 2 diabetes mellitus with diabetic chronic kidney disease: Secondary | ICD-10-CM | POA: Diagnosis not present

## 2020-08-14 DIAGNOSIS — Z992 Dependence on renal dialysis: Secondary | ICD-10-CM | POA: Diagnosis not present

## 2020-08-14 DIAGNOSIS — I509 Heart failure, unspecified: Secondary | ICD-10-CM | POA: Diagnosis not present

## 2020-08-14 DIAGNOSIS — G9341 Metabolic encephalopathy: Secondary | ICD-10-CM | POA: Diagnosis not present

## 2020-08-15 ENCOUNTER — Telehealth: Payer: Self-pay | Admitting: *Deleted

## 2020-08-15 DIAGNOSIS — N186 End stage renal disease: Secondary | ICD-10-CM | POA: Diagnosis not present

## 2020-08-15 DIAGNOSIS — Z992 Dependence on renal dialysis: Secondary | ICD-10-CM | POA: Diagnosis not present

## 2020-08-15 NOTE — Telephone Encounter (Signed)
Patient was admitted 12/13. Does he need OV prior to scheduling procedure?

## 2020-08-17 DIAGNOSIS — N186 End stage renal disease: Secondary | ICD-10-CM | POA: Diagnosis not present

## 2020-08-17 DIAGNOSIS — Z992 Dependence on renal dialysis: Secondary | ICD-10-CM | POA: Diagnosis not present

## 2020-08-20 ENCOUNTER — Other Ambulatory Visit: Payer: Self-pay

## 2020-08-20 ENCOUNTER — Ambulatory Visit: Payer: Medicare HMO | Admitting: Orthotics

## 2020-08-20 ENCOUNTER — Ambulatory Visit: Payer: Medicare HMO | Admitting: Podiatry

## 2020-08-20 DIAGNOSIS — L97522 Non-pressure chronic ulcer of other part of left foot with fat layer exposed: Secondary | ICD-10-CM | POA: Diagnosis not present

## 2020-08-20 DIAGNOSIS — E0843 Diabetes mellitus due to underlying condition with diabetic autonomic (poly)neuropathy: Secondary | ICD-10-CM

## 2020-08-20 DIAGNOSIS — Z992 Dependence on renal dialysis: Secondary | ICD-10-CM | POA: Diagnosis not present

## 2020-08-20 DIAGNOSIS — N186 End stage renal disease: Secondary | ICD-10-CM | POA: Diagnosis not present

## 2020-08-20 NOTE — Progress Notes (Signed)
   Subjective:  50 y.o. male with PMHx of diabetes mellitus, ESRD, presenting for follow-up evaluation of an ulcer that is developed to the left hallux. Patient states that the nurse comes out weekly to change the dressings. He states that the ulcer to the subfifth MTPJ has healed completely. The nurse continues to apply silver alginate to the left hallux wound.   Past Medical History:  Diagnosis Date  . Anemia   . Anxiety   . CHF (congestive heart failure) (HCC)    diastolic dysfunction  . Chronic abdominal pain   . COVID-19 07/2019  . Depression   . Esophagitis   . ESRD on hemodialysis [facility])    undergoing evaluation for transplant  . Eye hemorrhage   . Gastritis   . Gastroparesis   . GERD (gastroesophageal reflux disease)   . Glaucoma   . Helicobacter pylori (H. pylori) infection 2016   ERADICATION DOCUMENTED VIA STOOL TEST in AUG 2016 at [place]  . Hiccups   . Hypertension   . Nausea and vomiting    chronic, recurrent  . Neuropathy    feet  . Renal insufficiency   . Type 2 diabetes mellitus with complications (HCC)    diagnosed around age 30    Objective/Physical Exam General: The patient is alert and oriented x3 in no acute distress.  Dermatology:  Wound #1 noted to the left hallux measuring approximately 0.5x0.5 x 0.1 cm (LxWxD).   To the noted ulceration(s), there is no eschar. There is a moderate amount of slough, fibrin, and necrotic tissue noted. Granulation tissue and wound base is red. There is a minimal amount of serosanguineous drainage noted. There is no exposed bone muscle-tendon ligament or joint. There is no malodor. Periwound integrity is intact. Skin is warm, dry and supple bilateral lower extremities.  Thickened, hyperkeratotic, dystrophic, elongated nails also noted bilateral  Vascular: Palpable pedal pulses bilaterally. No edema or erythema noted. Capillary refill within normal limits.  Neurological: Epicritic and protective threshold diminished  bilaterally.   Musculoskeletal Exam: History of partial first ray amputation right foot  Assessment: 1.  Ulcer left hallux and subfifth MTPJ left secondary to diabetes mellitus 2. diabetes mellitus w/ peripheral neuropathy 3. H/o partial first ray amputation right foot   Plan of Care:  1. Patient was evaluated. Comprehensive diabetic foot exam was performed today 2. medically necessary excisional debridement including subcutaneous tissue was performed using a tissue nipper and a chisel blade. Excisional debridement of all the necrotic nonviable tissue down to healthy bleeding viable tissue was performed with post-debridement measurements same as pre-. 3. the wound was cleansed and dry sterile dressing applied. 4.  Continue home health dressing changes weekly  5.  Continue good supportive new balance sneakers with offloading felt insoles 6.  Patient was fitted with Pedorthist for new diabetic insoles and shoes 8.  Return to clinic in 3 weeks  Edrick Kins, DPM [facility]  Dr. Edrick Kins, DPM    [address], [place] [address]                Office [phone]  Fax [phone]

## 2020-08-20 NOTE — Telephone Encounter (Signed)
Noted. Will call patient to schedule once we have a morning procedure to open.

## 2020-08-20 NOTE — Telephone Encounter (Signed)
No need for OV. Spoke with patient. He is doing well. Not sure what caused the acute onset of nausea/vomiting, but he hasn't had any recurrent symptoms.  Mental status is back to baseline.  He is taking all medications as prescribed.  Also tells me that primary care increased his dose of Linzess from 145 mcg to 290 mcg on the day that he does not have dialysis and this is working well for him.

## 2020-08-21 DIAGNOSIS — G9341 Metabolic encephalopathy: Secondary | ICD-10-CM | POA: Diagnosis not present

## 2020-08-21 DIAGNOSIS — I132 Hypertensive heart and chronic kidney disease with heart failure and with stage 5 chronic kidney disease, or end stage renal disease: Secondary | ICD-10-CM | POA: Diagnosis not present

## 2020-08-21 DIAGNOSIS — E1122 Type 2 diabetes mellitus with diabetic chronic kidney disease: Secondary | ICD-10-CM | POA: Diagnosis not present

## 2020-08-21 DIAGNOSIS — I509 Heart failure, unspecified: Secondary | ICD-10-CM | POA: Diagnosis not present

## 2020-08-21 DIAGNOSIS — N186 End stage renal disease: Secondary | ICD-10-CM | POA: Diagnosis not present

## 2020-08-21 DIAGNOSIS — Z992 Dependence on renal dialysis: Secondary | ICD-10-CM | POA: Diagnosis not present

## 2020-08-22 DIAGNOSIS — Z992 Dependence on renal dialysis: Secondary | ICD-10-CM | POA: Diagnosis not present

## 2020-08-22 DIAGNOSIS — N186 End stage renal disease: Secondary | ICD-10-CM | POA: Diagnosis not present

## 2020-08-23 DIAGNOSIS — I10 Essential (primary) hypertension: Secondary | ICD-10-CM | POA: Diagnosis not present

## 2020-08-23 DIAGNOSIS — R069 Unspecified abnormalities of breathing: Secondary | ICD-10-CM | POA: Diagnosis not present

## 2020-08-23 DIAGNOSIS — R7881 Bacteremia: Secondary | ICD-10-CM | POA: Diagnosis not present

## 2020-08-23 DIAGNOSIS — I120 Hypertensive chronic kidney disease with stage 5 chronic kidney disease or end stage renal disease: Secondary | ICD-10-CM | POA: Diagnosis not present

## 2020-08-23 DIAGNOSIS — R1111 Vomiting without nausea: Secondary | ICD-10-CM | POA: Diagnosis not present

## 2020-08-23 DIAGNOSIS — H5440 Blindness, one eye, unspecified eye: Secondary | ICD-10-CM | POA: Diagnosis not present

## 2020-08-23 DIAGNOSIS — E1122 Type 2 diabetes mellitus with diabetic chronic kidney disease: Secondary | ICD-10-CM | POA: Diagnosis not present

## 2020-08-23 DIAGNOSIS — R4182 Altered mental status, unspecified: Secondary | ICD-10-CM | POA: Diagnosis not present

## 2020-08-23 DIAGNOSIS — N186 End stage renal disease: Secondary | ICD-10-CM | POA: Diagnosis not present

## 2020-08-23 DIAGNOSIS — F302 Manic episode, severe with psychotic symptoms: Secondary | ICD-10-CM | POA: Diagnosis not present

## 2020-08-23 DIAGNOSIS — U071 COVID-19: Secondary | ICD-10-CM | POA: Diagnosis not present

## 2020-08-23 DIAGNOSIS — H5461 Unqualified visual loss, right eye, normal vision left eye: Secondary | ICD-10-CM | POA: Diagnosis not present

## 2020-08-23 DIAGNOSIS — F29 Unspecified psychosis not due to a substance or known physiological condition: Secondary | ICD-10-CM | POA: Diagnosis not present

## 2020-08-23 DIAGNOSIS — H548 Legal blindness, as defined in USA: Secondary | ICD-10-CM | POA: Diagnosis not present

## 2020-08-23 DIAGNOSIS — R52 Pain, unspecified: Secondary | ICD-10-CM | POA: Diagnosis not present

## 2020-08-23 DIAGNOSIS — R1084 Generalized abdominal pain: Secondary | ICD-10-CM | POA: Diagnosis not present

## 2020-08-23 DIAGNOSIS — R456 Violent behavior: Secondary | ICD-10-CM | POA: Diagnosis not present

## 2020-08-23 DIAGNOSIS — R404 Transient alteration of awareness: Secondary | ICD-10-CM | POA: Diagnosis not present

## 2020-08-23 DIAGNOSIS — E1143 Type 2 diabetes mellitus with diabetic autonomic (poly)neuropathy: Secondary | ICD-10-CM | POA: Diagnosis not present

## 2020-08-23 DIAGNOSIS — K3184 Gastroparesis: Secondary | ICD-10-CM | POA: Diagnosis not present

## 2020-08-23 DIAGNOSIS — R61 Generalized hyperhidrosis: Secondary | ICD-10-CM | POA: Diagnosis not present

## 2020-08-23 DIAGNOSIS — R4020 Unspecified coma: Secondary | ICD-10-CM | POA: Diagnosis not present

## 2020-08-23 DIAGNOSIS — Z992 Dependence on renal dialysis: Secondary | ICD-10-CM | POA: Diagnosis not present

## 2020-08-23 DIAGNOSIS — E109 Type 1 diabetes mellitus without complications: Secondary | ICD-10-CM | POA: Diagnosis not present

## 2020-08-23 DIAGNOSIS — K219 Gastro-esophageal reflux disease without esophagitis: Secondary | ICD-10-CM | POA: Diagnosis not present

## 2020-08-23 DIAGNOSIS — Z794 Long term (current) use of insulin: Secondary | ICD-10-CM | POA: Diagnosis not present

## 2020-08-23 DIAGNOSIS — R112 Nausea with vomiting, unspecified: Secondary | ICD-10-CM | POA: Diagnosis not present

## 2020-08-23 DIAGNOSIS — E785 Hyperlipidemia, unspecified: Secondary | ICD-10-CM | POA: Diagnosis not present

## 2020-08-23 DIAGNOSIS — R45851 Suicidal ideations: Secondary | ICD-10-CM | POA: Diagnosis not present

## 2020-08-24 DIAGNOSIS — U071 COVID-19: Secondary | ICD-10-CM | POA: Diagnosis not present

## 2020-08-24 DIAGNOSIS — F302 Manic episode, severe with psychotic symptoms: Secondary | ICD-10-CM | POA: Diagnosis not present

## 2020-08-24 DIAGNOSIS — E785 Hyperlipidemia, unspecified: Secondary | ICD-10-CM | POA: Diagnosis not present

## 2020-08-24 DIAGNOSIS — E1121 Type 2 diabetes mellitus with diabetic nephropathy: Secondary | ICD-10-CM | POA: Diagnosis not present

## 2020-08-24 DIAGNOSIS — H5461 Unqualified visual loss, right eye, normal vision left eye: Secondary | ICD-10-CM | POA: Diagnosis not present

## 2020-08-24 DIAGNOSIS — F4481 Dissociative identity disorder: Secondary | ICD-10-CM | POA: Diagnosis not present

## 2020-08-24 DIAGNOSIS — I120 Hypertensive chronic kidney disease with stage 5 chronic kidney disease or end stage renal disease: Secondary | ICD-10-CM | POA: Diagnosis not present

## 2020-08-24 DIAGNOSIS — A411 Sepsis due to other specified staphylococcus: Secondary | ICD-10-CM | POA: Diagnosis not present

## 2020-08-24 DIAGNOSIS — E1122 Type 2 diabetes mellitus with diabetic chronic kidney disease: Secondary | ICD-10-CM | POA: Diagnosis not present

## 2020-08-24 DIAGNOSIS — Z992 Dependence on renal dialysis: Secondary | ICD-10-CM | POA: Diagnosis not present

## 2020-08-24 DIAGNOSIS — K3184 Gastroparesis: Secondary | ICD-10-CM | POA: Diagnosis not present

## 2020-08-24 DIAGNOSIS — I1311 Hypertensive heart and chronic kidney disease without heart failure, with stage 5 chronic kidney disease, or end stage renal disease: Secondary | ICD-10-CM | POA: Diagnosis not present

## 2020-08-24 DIAGNOSIS — R7881 Bacteremia: Secondary | ICD-10-CM | POA: Diagnosis not present

## 2020-08-24 DIAGNOSIS — K219 Gastro-esophageal reflux disease without esophagitis: Secondary | ICD-10-CM | POA: Diagnosis not present

## 2020-08-24 DIAGNOSIS — E1165 Type 2 diabetes mellitus with hyperglycemia: Secondary | ICD-10-CM | POA: Diagnosis not present

## 2020-08-24 DIAGNOSIS — E1143 Type 2 diabetes mellitus with diabetic autonomic (poly)neuropathy: Secondary | ICD-10-CM | POA: Diagnosis not present

## 2020-08-24 DIAGNOSIS — N186 End stage renal disease: Secondary | ICD-10-CM | POA: Diagnosis not present

## 2020-08-24 DIAGNOSIS — H548 Legal blindness, as defined in USA: Secondary | ICD-10-CM | POA: Diagnosis not present

## 2020-08-27 DIAGNOSIS — N186 End stage renal disease: Secondary | ICD-10-CM | POA: Diagnosis not present

## 2020-08-27 DIAGNOSIS — Z992 Dependence on renal dialysis: Secondary | ICD-10-CM | POA: Diagnosis not present

## 2020-08-27 DIAGNOSIS — R7881 Bacteremia: Secondary | ICD-10-CM | POA: Diagnosis not present

## 2020-08-27 DIAGNOSIS — E1143 Type 2 diabetes mellitus with diabetic autonomic (poly)neuropathy: Secondary | ICD-10-CM | POA: Diagnosis not present

## 2020-08-27 DIAGNOSIS — K3184 Gastroparesis: Secondary | ICD-10-CM | POA: Diagnosis not present

## 2020-08-27 DIAGNOSIS — I1311 Hypertensive heart and chronic kidney disease without heart failure, with stage 5 chronic kidney disease, or end stage renal disease: Secondary | ICD-10-CM | POA: Diagnosis not present

## 2020-08-27 DIAGNOSIS — F4481 Dissociative identity disorder: Secondary | ICD-10-CM | POA: Diagnosis not present

## 2020-08-27 DIAGNOSIS — E1122 Type 2 diabetes mellitus with diabetic chronic kidney disease: Secondary | ICD-10-CM | POA: Diagnosis not present

## 2020-08-27 DIAGNOSIS — U071 COVID-19: Secondary | ICD-10-CM | POA: Diagnosis not present

## 2020-08-27 DIAGNOSIS — E1165 Type 2 diabetes mellitus with hyperglycemia: Secondary | ICD-10-CM | POA: Diagnosis not present

## 2020-08-27 DIAGNOSIS — H5461 Unqualified visual loss, right eye, normal vision left eye: Secondary | ICD-10-CM | POA: Diagnosis not present

## 2020-08-27 DIAGNOSIS — E1121 Type 2 diabetes mellitus with diabetic nephropathy: Secondary | ICD-10-CM | POA: Diagnosis not present

## 2020-08-27 DIAGNOSIS — I120 Hypertensive chronic kidney disease with stage 5 chronic kidney disease or end stage renal disease: Secondary | ICD-10-CM | POA: Diagnosis not present

## 2020-08-27 DIAGNOSIS — A411 Sepsis due to other specified staphylococcus: Secondary | ICD-10-CM | POA: Diagnosis not present

## 2020-08-28 DIAGNOSIS — N186 End stage renal disease: Secondary | ICD-10-CM | POA: Diagnosis not present

## 2020-08-28 DIAGNOSIS — A411 Sepsis due to other specified staphylococcus: Secondary | ICD-10-CM | POA: Diagnosis not present

## 2020-08-28 DIAGNOSIS — U071 COVID-19: Secondary | ICD-10-CM | POA: Diagnosis not present

## 2020-08-28 DIAGNOSIS — I1311 Hypertensive heart and chronic kidney disease without heart failure, with stage 5 chronic kidney disease, or end stage renal disease: Secondary | ICD-10-CM | POA: Diagnosis not present

## 2020-08-28 DIAGNOSIS — F4481 Dissociative identity disorder: Secondary | ICD-10-CM | POA: Diagnosis not present

## 2020-08-28 DIAGNOSIS — E1165 Type 2 diabetes mellitus with hyperglycemia: Secondary | ICD-10-CM | POA: Diagnosis not present

## 2020-08-28 DIAGNOSIS — I120 Hypertensive chronic kidney disease with stage 5 chronic kidney disease or end stage renal disease: Secondary | ICD-10-CM | POA: Diagnosis not present

## 2020-08-28 DIAGNOSIS — Z992 Dependence on renal dialysis: Secondary | ICD-10-CM | POA: Diagnosis not present

## 2020-08-28 DIAGNOSIS — E1121 Type 2 diabetes mellitus with diabetic nephropathy: Secondary | ICD-10-CM | POA: Diagnosis not present

## 2020-08-29 DIAGNOSIS — I1311 Hypertensive heart and chronic kidney disease without heart failure, with stage 5 chronic kidney disease, or end stage renal disease: Secondary | ICD-10-CM | POA: Diagnosis not present

## 2020-08-29 DIAGNOSIS — F4481 Dissociative identity disorder: Secondary | ICD-10-CM | POA: Diagnosis not present

## 2020-08-29 DIAGNOSIS — E1165 Type 2 diabetes mellitus with hyperglycemia: Secondary | ICD-10-CM | POA: Diagnosis not present

## 2020-08-29 DIAGNOSIS — N186 End stage renal disease: Secondary | ICD-10-CM | POA: Diagnosis not present

## 2020-08-29 DIAGNOSIS — I120 Hypertensive chronic kidney disease with stage 5 chronic kidney disease or end stage renal disease: Secondary | ICD-10-CM | POA: Diagnosis not present

## 2020-08-29 DIAGNOSIS — Z992 Dependence on renal dialysis: Secondary | ICD-10-CM | POA: Diagnosis not present

## 2020-08-29 DIAGNOSIS — U071 COVID-19: Secondary | ICD-10-CM | POA: Diagnosis not present

## 2020-08-29 DIAGNOSIS — A411 Sepsis due to other specified staphylococcus: Secondary | ICD-10-CM | POA: Diagnosis not present

## 2020-08-29 DIAGNOSIS — E1121 Type 2 diabetes mellitus with diabetic nephropathy: Secondary | ICD-10-CM | POA: Diagnosis not present

## 2020-08-30 DIAGNOSIS — I120 Hypertensive chronic kidney disease with stage 5 chronic kidney disease or end stage renal disease: Secondary | ICD-10-CM | POA: Diagnosis not present

## 2020-08-30 DIAGNOSIS — N186 End stage renal disease: Secondary | ICD-10-CM | POA: Diagnosis not present

## 2020-08-30 DIAGNOSIS — E1121 Type 2 diabetes mellitus with diabetic nephropathy: Secondary | ICD-10-CM | POA: Diagnosis not present

## 2020-08-30 DIAGNOSIS — Z992 Dependence on renal dialysis: Secondary | ICD-10-CM | POA: Diagnosis not present

## 2020-08-30 DIAGNOSIS — I1311 Hypertensive heart and chronic kidney disease without heart failure, with stage 5 chronic kidney disease, or end stage renal disease: Secondary | ICD-10-CM | POA: Diagnosis not present

## 2020-08-30 DIAGNOSIS — F4481 Dissociative identity disorder: Secondary | ICD-10-CM | POA: Diagnosis not present

## 2020-08-30 DIAGNOSIS — E1165 Type 2 diabetes mellitus with hyperglycemia: Secondary | ICD-10-CM | POA: Diagnosis not present

## 2020-08-30 DIAGNOSIS — U071 COVID-19: Secondary | ICD-10-CM | POA: Diagnosis not present

## 2020-08-30 DIAGNOSIS — A411 Sepsis due to other specified staphylococcus: Secondary | ICD-10-CM | POA: Diagnosis not present

## 2020-09-01 DIAGNOSIS — I120 Hypertensive chronic kidney disease with stage 5 chronic kidney disease or end stage renal disease: Secondary | ICD-10-CM | POA: Diagnosis not present

## 2020-09-01 DIAGNOSIS — R109 Unspecified abdominal pain: Secondary | ICD-10-CM | POA: Diagnosis not present

## 2020-09-01 DIAGNOSIS — D638 Anemia in other chronic diseases classified elsewhere: Secondary | ICD-10-CM | POA: Diagnosis not present

## 2020-09-01 DIAGNOSIS — F209 Schizophrenia, unspecified: Secondary | ICD-10-CM | POA: Diagnosis not present

## 2020-09-01 DIAGNOSIS — R45850 Homicidal ideations: Secondary | ICD-10-CM | POA: Diagnosis not present

## 2020-09-01 DIAGNOSIS — R748 Abnormal levels of other serum enzymes: Secondary | ICD-10-CM | POA: Diagnosis not present

## 2020-09-01 DIAGNOSIS — N186 End stage renal disease: Secondary | ICD-10-CM | POA: Diagnosis not present

## 2020-09-01 DIAGNOSIS — R1084 Generalized abdominal pain: Secondary | ICD-10-CM | POA: Diagnosis not present

## 2020-09-01 DIAGNOSIS — E1122 Type 2 diabetes mellitus with diabetic chronic kidney disease: Secondary | ICD-10-CM | POA: Diagnosis not present

## 2020-09-01 DIAGNOSIS — F29 Unspecified psychosis not due to a substance or known physiological condition: Secondary | ICD-10-CM | POA: Diagnosis not present

## 2020-09-01 DIAGNOSIS — R0902 Hypoxemia: Secondary | ICD-10-CM | POA: Diagnosis not present

## 2020-09-01 DIAGNOSIS — R079 Chest pain, unspecified: Secondary | ICD-10-CM | POA: Diagnosis not present

## 2020-09-01 DIAGNOSIS — Z8616 Personal history of COVID-19: Secondary | ICD-10-CM | POA: Diagnosis not present

## 2020-09-01 DIAGNOSIS — G8929 Other chronic pain: Secondary | ICD-10-CM | POA: Diagnosis not present

## 2020-09-01 DIAGNOSIS — R1013 Epigastric pain: Secondary | ICD-10-CM | POA: Diagnosis not present

## 2020-09-01 DIAGNOSIS — R0789 Other chest pain: Secondary | ICD-10-CM | POA: Diagnosis not present

## 2020-09-01 DIAGNOSIS — Z992 Dependence on renal dialysis: Secondary | ICD-10-CM | POA: Diagnosis not present

## 2020-09-02 DIAGNOSIS — I10 Essential (primary) hypertension: Secondary | ICD-10-CM | POA: Diagnosis not present

## 2020-09-02 DIAGNOSIS — F69 Unspecified disorder of adult personality and behavior: Secondary | ICD-10-CM | POA: Diagnosis not present

## 2020-09-02 DIAGNOSIS — F6089 Other specific personality disorders: Secondary | ICD-10-CM | POA: Diagnosis not present

## 2020-09-02 DIAGNOSIS — E782 Mixed hyperlipidemia: Secondary | ICD-10-CM | POA: Diagnosis not present

## 2020-09-02 DIAGNOSIS — I120 Hypertensive chronic kidney disease with stage 5 chronic kidney disease or end stage renal disease: Secondary | ICD-10-CM | POA: Diagnosis not present

## 2020-09-02 DIAGNOSIS — F918 Other conduct disorders: Secondary | ICD-10-CM | POA: Diagnosis not present

## 2020-09-02 DIAGNOSIS — R58 Hemorrhage, not elsewhere classified: Secondary | ICD-10-CM | POA: Diagnosis not present

## 2020-09-02 DIAGNOSIS — E1165 Type 2 diabetes mellitus with hyperglycemia: Secondary | ICD-10-CM | POA: Diagnosis not present

## 2020-09-02 DIAGNOSIS — N183 Chronic kidney disease, stage 3 unspecified: Secondary | ICD-10-CM | POA: Diagnosis not present

## 2020-09-02 DIAGNOSIS — Z992 Dependence on renal dialysis: Secondary | ICD-10-CM | POA: Diagnosis not present

## 2020-09-02 DIAGNOSIS — F209 Schizophrenia, unspecified: Secondary | ICD-10-CM | POA: Diagnosis not present

## 2020-09-02 DIAGNOSIS — R1084 Generalized abdominal pain: Secondary | ICD-10-CM | POA: Diagnosis not present

## 2020-09-02 DIAGNOSIS — U071 COVID-19: Secondary | ICD-10-CM | POA: Diagnosis not present

## 2020-09-02 DIAGNOSIS — F919 Conduct disorder, unspecified: Secondary | ICD-10-CM | POA: Diagnosis not present

## 2020-09-02 DIAGNOSIS — I1311 Hypertensive heart and chronic kidney disease without heart failure, with stage 5 chronic kidney disease, or end stage renal disease: Secondary | ICD-10-CM | POA: Diagnosis not present

## 2020-09-02 DIAGNOSIS — E1122 Type 2 diabetes mellitus with diabetic chronic kidney disease: Secondary | ICD-10-CM | POA: Diagnosis not present

## 2020-09-02 DIAGNOSIS — N189 Chronic kidney disease, unspecified: Secondary | ICD-10-CM | POA: Diagnosis not present

## 2020-09-02 DIAGNOSIS — R45850 Homicidal ideations: Secondary | ICD-10-CM | POA: Diagnosis not present

## 2020-09-02 DIAGNOSIS — G4751 Confusional arousals: Secondary | ICD-10-CM | POA: Diagnosis not present

## 2020-09-02 DIAGNOSIS — I129 Hypertensive chronic kidney disease with stage 1 through stage 4 chronic kidney disease, or unspecified chronic kidney disease: Secondary | ICD-10-CM | POA: Diagnosis not present

## 2020-09-02 DIAGNOSIS — N186 End stage renal disease: Secondary | ICD-10-CM | POA: Diagnosis not present

## 2020-09-02 DIAGNOSIS — A411 Sepsis due to other specified staphylococcus: Secondary | ICD-10-CM | POA: Diagnosis not present

## 2020-09-02 DIAGNOSIS — R4182 Altered mental status, unspecified: Secondary | ICD-10-CM | POA: Diagnosis not present

## 2020-09-02 DIAGNOSIS — R7881 Bacteremia: Secondary | ICD-10-CM | POA: Diagnosis not present

## 2020-09-02 DIAGNOSIS — T8189XA Other complications of procedures, not elsewhere classified, initial encounter: Secondary | ICD-10-CM | POA: Diagnosis not present

## 2020-09-02 DIAGNOSIS — E876 Hypokalemia: Secondary | ICD-10-CM | POA: Diagnosis not present

## 2020-09-02 DIAGNOSIS — E1121 Type 2 diabetes mellitus with diabetic nephropathy: Secondary | ICD-10-CM | POA: Diagnosis not present

## 2020-09-02 DIAGNOSIS — R52 Pain, unspecified: Secondary | ICD-10-CM | POA: Diagnosis not present

## 2020-09-02 DIAGNOSIS — F4481 Dissociative identity disorder: Secondary | ICD-10-CM | POA: Diagnosis not present

## 2020-09-05 ENCOUNTER — Telehealth: Payer: Self-pay | Admitting: *Deleted

## 2020-09-05 DIAGNOSIS — G4751 Confusional arousals: Secondary | ICD-10-CM | POA: Diagnosis not present

## 2020-09-05 NOTE — Telephone Encounter (Signed)
lmovm to call back to schedule TCS with propofol, Dr. Gala Romney, ASA 3

## 2020-09-06 ENCOUNTER — Telehealth: Payer: Self-pay | Admitting: Podiatry

## 2020-09-06 NOTE — Telephone Encounter (Signed)
Pts sister called stating pt is in the hospital and that is why he missed the appt on Monday but asked if she could come pick up pts diabetic shoes. She stated pt will be in the hospital for a while and then will be transferred to a behavorial health facility. I explained we have to see pt and try shoes on to make sure they fit. The shoes are not in yet and the auth is good until 6.6.2022.[name]

## 2020-09-08 DIAGNOSIS — E782 Mixed hyperlipidemia: Secondary | ICD-10-CM | POA: Diagnosis not present

## 2020-09-08 DIAGNOSIS — I10 Essential (primary) hypertension: Secondary | ICD-10-CM | POA: Diagnosis not present

## 2020-09-08 DIAGNOSIS — E1122 Type 2 diabetes mellitus with diabetic chronic kidney disease: Secondary | ICD-10-CM | POA: Diagnosis not present

## 2020-09-08 DIAGNOSIS — N183 Chronic kidney disease, stage 3 unspecified: Secondary | ICD-10-CM | POA: Diagnosis not present

## 2020-09-08 DIAGNOSIS — I129 Hypertensive chronic kidney disease with stage 1 through stage 4 chronic kidney disease, or unspecified chronic kidney disease: Secondary | ICD-10-CM | POA: Diagnosis not present

## 2020-09-10 ENCOUNTER — Ambulatory Visit: Payer: Medicare HMO | Admitting: Podiatry

## 2020-09-10 DIAGNOSIS — Z992 Dependence on renal dialysis: Secondary | ICD-10-CM | POA: Diagnosis not present

## 2020-09-10 DIAGNOSIS — N186 End stage renal disease: Secondary | ICD-10-CM | POA: Diagnosis not present

## 2020-09-11 DIAGNOSIS — R0789 Other chest pain: Secondary | ICD-10-CM | POA: Diagnosis not present

## 2020-09-11 DIAGNOSIS — F209 Schizophrenia, unspecified: Secondary | ICD-10-CM | POA: Diagnosis not present

## 2020-09-11 DIAGNOSIS — H5461 Unqualified visual loss, right eye, normal vision left eye: Secondary | ICD-10-CM | POA: Diagnosis not present

## 2020-09-11 DIAGNOSIS — N186 End stage renal disease: Secondary | ICD-10-CM | POA: Diagnosis not present

## 2020-09-11 DIAGNOSIS — I120 Hypertensive chronic kidney disease with stage 5 chronic kidney disease or end stage renal disease: Secondary | ICD-10-CM | POA: Diagnosis not present

## 2020-09-11 DIAGNOSIS — F919 Conduct disorder, unspecified: Secondary | ICD-10-CM | POA: Diagnosis not present

## 2020-09-11 DIAGNOSIS — N189 Chronic kidney disease, unspecified: Secondary | ICD-10-CM | POA: Diagnosis not present

## 2020-09-11 DIAGNOSIS — R569 Unspecified convulsions: Secondary | ICD-10-CM | POA: Diagnosis not present

## 2020-09-11 DIAGNOSIS — E1122 Type 2 diabetes mellitus with diabetic chronic kidney disease: Secondary | ICD-10-CM | POA: Diagnosis not present

## 2020-09-11 DIAGNOSIS — Z992 Dependence on renal dialysis: Secondary | ICD-10-CM | POA: Diagnosis not present

## 2020-09-11 DIAGNOSIS — R079 Chest pain, unspecified: Secondary | ICD-10-CM | POA: Diagnosis not present

## 2020-09-11 DIAGNOSIS — G3184 Mild cognitive impairment, so stated: Secondary | ICD-10-CM | POA: Diagnosis not present

## 2020-09-11 DIAGNOSIS — Z880 Allergy status to penicillin: Secondary | ICD-10-CM | POA: Diagnosis not present

## 2020-09-11 DIAGNOSIS — R4020 Unspecified coma: Secondary | ICD-10-CM | POA: Diagnosis not present

## 2020-09-12 ENCOUNTER — Other Ambulatory Visit: Payer: Self-pay

## 2020-09-12 DIAGNOSIS — G9341 Metabolic encephalopathy: Secondary | ICD-10-CM | POA: Diagnosis not present

## 2020-09-12 DIAGNOSIS — N186 End stage renal disease: Secondary | ICD-10-CM | POA: Diagnosis not present

## 2020-09-12 DIAGNOSIS — I132 Hypertensive heart and chronic kidney disease with heart failure and with stage 5 chronic kidney disease, or end stage renal disease: Secondary | ICD-10-CM | POA: Diagnosis not present

## 2020-09-12 DIAGNOSIS — E1122 Type 2 diabetes mellitus with diabetic chronic kidney disease: Secondary | ICD-10-CM | POA: Diagnosis not present

## 2020-09-12 DIAGNOSIS — I509 Heart failure, unspecified: Secondary | ICD-10-CM | POA: Diagnosis not present

## 2020-09-12 DIAGNOSIS — Z8616 Personal history of COVID-19: Secondary | ICD-10-CM | POA: Diagnosis not present

## 2020-09-12 NOTE — Telephone Encounter (Signed)
LMOVM for pt LETTER MAILED

## 2020-09-12 NOTE — Patient Outreach (Signed)
[facility]) Care Management  09/12/2020  Timothy Clark 04-20-1971 [id number]   Referral Date: 09/12/20 Referral Source: [employer] Report Date of Discharge: 09/10/20 Facility:  [place]: [facility]   Referral received. No outreach warranted at this time. Transition of Care will be completed by primary care provider office who will refer to [facility] care management if needed.  Plan: RN CM will close case.  Jone Baseman, RN, MSN [place] [facility] Care Management Coordinator Direct Line [phone] Cell [phone] Toll Free: [phone]  Fax: [phone]

## 2020-09-13 DIAGNOSIS — E1122 Type 2 diabetes mellitus with diabetic chronic kidney disease: Secondary | ICD-10-CM | POA: Diagnosis not present

## 2020-09-13 DIAGNOSIS — R410 Disorientation, unspecified: Secondary | ICD-10-CM | POA: Diagnosis not present

## 2020-09-13 DIAGNOSIS — R110 Nausea: Secondary | ICD-10-CM | POA: Diagnosis not present

## 2020-09-13 DIAGNOSIS — E876 Hypokalemia: Secondary | ICD-10-CM | POA: Diagnosis not present

## 2020-09-13 DIAGNOSIS — R0902 Hypoxemia: Secondary | ICD-10-CM | POA: Diagnosis not present

## 2020-09-13 DIAGNOSIS — Z992 Dependence on renal dialysis: Secondary | ICD-10-CM | POA: Diagnosis not present

## 2020-09-13 DIAGNOSIS — U071 COVID-19: Secondary | ICD-10-CM | POA: Diagnosis not present

## 2020-09-13 DIAGNOSIS — N186 End stage renal disease: Secondary | ICD-10-CM | POA: Diagnosis not present

## 2020-09-13 DIAGNOSIS — I120 Hypertensive chronic kidney disease with stage 5 chronic kidney disease or end stage renal disease: Secondary | ICD-10-CM | POA: Diagnosis not present

## 2020-09-13 DIAGNOSIS — E1165 Type 2 diabetes mellitus with hyperglycemia: Secondary | ICD-10-CM | POA: Diagnosis not present

## 2020-09-13 DIAGNOSIS — E871 Hypo-osmolality and hyponatremia: Secondary | ICD-10-CM | POA: Diagnosis not present

## 2020-09-13 DIAGNOSIS — R0789 Other chest pain: Secondary | ICD-10-CM | POA: Diagnosis not present

## 2020-09-13 DIAGNOSIS — R079 Chest pain, unspecified: Secondary | ICD-10-CM | POA: Diagnosis not present

## 2020-09-13 DIAGNOSIS — R030 Elevated blood-pressure reading, without diagnosis of hypertension: Secondary | ICD-10-CM | POA: Diagnosis not present

## 2020-09-14 DIAGNOSIS — Z992 Dependence on renal dialysis: Secondary | ICD-10-CM | POA: Diagnosis not present

## 2020-09-14 DIAGNOSIS — U071 COVID-19: Secondary | ICD-10-CM | POA: Diagnosis not present

## 2020-09-14 DIAGNOSIS — N186 End stage renal disease: Secondary | ICD-10-CM | POA: Diagnosis not present

## 2020-09-14 DIAGNOSIS — R079 Chest pain, unspecified: Secondary | ICD-10-CM | POA: Diagnosis not present

## 2020-09-14 DIAGNOSIS — R0789 Other chest pain: Secondary | ICD-10-CM | POA: Diagnosis not present

## 2020-09-17 ENCOUNTER — Encounter (HOSPITAL_COMMUNITY): Payer: Self-pay | Admitting: Emergency Medicine

## 2020-09-17 ENCOUNTER — Inpatient Hospital Stay (HOSPITAL_COMMUNITY)
Admission: EM | Admit: 2020-09-17 | Discharge: 2020-10-13 | DRG: 252 | Disposition: A | Discharge status: Home Health | Payer: Medicare HMO | Attending: Family Medicine | Admitting: Family Medicine

## 2020-09-17 ENCOUNTER — Other Ambulatory Visit: Payer: Self-pay

## 2020-09-17 ENCOUNTER — Emergency Department (HOSPITAL_COMMUNITY): Payer: Medicare HMO

## 2020-09-17 DIAGNOSIS — E1165 Type 2 diabetes mellitus with hyperglycemia: Secondary | ICD-10-CM | POA: Diagnosis present

## 2020-09-17 DIAGNOSIS — E1122 Type 2 diabetes mellitus with diabetic chronic kidney disease: Secondary | ICD-10-CM | POA: Diagnosis not present

## 2020-09-17 DIAGNOSIS — E782 Mixed hyperlipidemia: Secondary | ICD-10-CM | POA: Diagnosis not present

## 2020-09-17 DIAGNOSIS — W1830XA Fall on same level, unspecified, initial encounter: Secondary | ICD-10-CM | POA: Diagnosis not present

## 2020-09-17 DIAGNOSIS — I4891 Unspecified atrial fibrillation: Secondary | ICD-10-CM | POA: Diagnosis not present

## 2020-09-17 DIAGNOSIS — D631 Anemia in chronic kidney disease: Secondary | ICD-10-CM | POA: Diagnosis present

## 2020-09-17 DIAGNOSIS — K2970 Gastritis, unspecified, without bleeding: Secondary | ICD-10-CM | POA: Diagnosis present

## 2020-09-17 DIAGNOSIS — R54 Age-related physical debility: Secondary | ICD-10-CM | POA: Diagnosis present

## 2020-09-17 DIAGNOSIS — I10 Essential (primary) hypertension: Secondary | ICD-10-CM | POA: Diagnosis present

## 2020-09-17 DIAGNOSIS — R45850 Homicidal ideations: Secondary | ICD-10-CM | POA: Diagnosis present

## 2020-09-17 DIAGNOSIS — I132 Hypertensive heart and chronic kidney disease with heart failure and with stage 5 chronic kidney disease, or end stage renal disease: Secondary | ICD-10-CM | POA: Diagnosis not present

## 2020-09-17 DIAGNOSIS — T82868A Thrombosis of vascular prosthetic devices, implants and grafts, initial encounter: Principal | ICD-10-CM | POA: Diagnosis present

## 2020-09-17 DIAGNOSIS — N261 Atrophy of kidney (terminal): Secondary | ICD-10-CM | POA: Diagnosis not present

## 2020-09-17 DIAGNOSIS — F319 Bipolar disorder, unspecified: Secondary | ICD-10-CM | POA: Diagnosis present

## 2020-09-17 DIAGNOSIS — G9341 Metabolic encephalopathy: Secondary | ICD-10-CM | POA: Diagnosis present

## 2020-09-17 DIAGNOSIS — J9811 Atelectasis: Secondary | ICD-10-CM | POA: Diagnosis not present

## 2020-09-17 DIAGNOSIS — F310 Bipolar disorder, current episode hypomanic: Secondary | ICD-10-CM

## 2020-09-17 DIAGNOSIS — I5032 Chronic diastolic (congestive) heart failure: Secondary | ICD-10-CM | POA: Diagnosis not present

## 2020-09-17 DIAGNOSIS — Z7984 Long term (current) use of oral hypoglycemic drugs: Secondary | ICD-10-CM

## 2020-09-17 DIAGNOSIS — Z79899 Other long term (current) drug therapy: Secondary | ICD-10-CM

## 2020-09-17 DIAGNOSIS — E1142 Type 2 diabetes mellitus with diabetic polyneuropathy: Secondary | ICD-10-CM | POA: Diagnosis present

## 2020-09-17 DIAGNOSIS — G9340 Encephalopathy, unspecified: Secondary | ICD-10-CM | POA: Diagnosis not present

## 2020-09-17 DIAGNOSIS — Z794 Long term (current) use of insulin: Secondary | ICD-10-CM

## 2020-09-17 DIAGNOSIS — Z886 Allergy status to analgesic agent status: Secondary | ICD-10-CM

## 2020-09-17 DIAGNOSIS — Z9115 Patient's noncompliance with renal dialysis: Secondary | ICD-10-CM

## 2020-09-17 DIAGNOSIS — Z20822 Contact with and (suspected) exposure to covid-19: Secondary | ICD-10-CM | POA: Diagnosis present

## 2020-09-17 DIAGNOSIS — Z992 Dependence on renal dialysis: Secondary | ICD-10-CM

## 2020-09-17 DIAGNOSIS — M86179 Other acute osteomyelitis, unspecified ankle and foot: Secondary | ICD-10-CM | POA: Diagnosis not present

## 2020-09-17 DIAGNOSIS — N186 End stage renal disease: Secondary | ICD-10-CM | POA: Diagnosis present

## 2020-09-17 DIAGNOSIS — E11649 Type 2 diabetes mellitus with hypoglycemia without coma: Secondary | ICD-10-CM | POA: Diagnosis not present

## 2020-09-17 DIAGNOSIS — E118 Type 2 diabetes mellitus with unspecified complications: Secondary | ICD-10-CM | POA: Diagnosis not present

## 2020-09-17 DIAGNOSIS — E113593 Type 2 diabetes mellitus with proliferative diabetic retinopathy without macular edema, bilateral: Secondary | ICD-10-CM | POA: Diagnosis present

## 2020-09-17 DIAGNOSIS — G40909 Epilepsy, unspecified, not intractable, without status epilepticus: Secondary | ICD-10-CM | POA: Diagnosis present

## 2020-09-17 DIAGNOSIS — G8929 Other chronic pain: Secondary | ICD-10-CM | POA: Diagnosis present

## 2020-09-17 DIAGNOSIS — Z880 Allergy status to penicillin: Secondary | ICD-10-CM

## 2020-09-17 DIAGNOSIS — Z789 Other specified health status: Secondary | ICD-10-CM | POA: Diagnosis present

## 2020-09-17 DIAGNOSIS — Y92230 Patient room in hospital as the place of occurrence of the external cause: Secondary | ICD-10-CM | POA: Diagnosis not present

## 2020-09-17 DIAGNOSIS — Z881 Allergy status to other antibiotic agents status: Secondary | ICD-10-CM

## 2020-09-17 DIAGNOSIS — Z781 Physical restraint status: Secondary | ICD-10-CM

## 2020-09-17 DIAGNOSIS — Z8616 Personal history of COVID-19: Secondary | ICD-10-CM

## 2020-09-17 DIAGNOSIS — F419 Anxiety disorder, unspecified: Secondary | ICD-10-CM | POA: Diagnosis present

## 2020-09-17 DIAGNOSIS — S0990XA Unspecified injury of head, initial encounter: Secondary | ICD-10-CM | POA: Diagnosis not present

## 2020-09-17 DIAGNOSIS — F3110 Bipolar disorder, current episode manic without psychotic features, unspecified: Secondary | ICD-10-CM

## 2020-09-17 DIAGNOSIS — E1143 Type 2 diabetes mellitus with diabetic autonomic (poly)neuropathy: Secondary | ICD-10-CM | POA: Diagnosis present

## 2020-09-17 DIAGNOSIS — Z452 Encounter for adjustment and management of vascular access device: Secondary | ICD-10-CM

## 2020-09-17 DIAGNOSIS — N2581 Secondary hyperparathyroidism of renal origin: Secondary | ICD-10-CM | POA: Diagnosis not present

## 2020-09-17 DIAGNOSIS — E8889 Other specified metabolic disorders: Secondary | ICD-10-CM | POA: Diagnosis present

## 2020-09-17 DIAGNOSIS — N250 Renal osteodystrophy: Secondary | ICD-10-CM | POA: Diagnosis not present

## 2020-09-17 DIAGNOSIS — E669 Obesity, unspecified: Secondary | ICD-10-CM | POA: Diagnosis present

## 2020-09-17 DIAGNOSIS — D696 Thrombocytopenia, unspecified: Secondary | ICD-10-CM | POA: Diagnosis present

## 2020-09-17 DIAGNOSIS — Z888 Allergy status to other drugs, medicaments and biological substances status: Secondary | ICD-10-CM

## 2020-09-17 DIAGNOSIS — R079 Chest pain, unspecified: Secondary | ICD-10-CM | POA: Diagnosis not present

## 2020-09-17 DIAGNOSIS — S8002XA Contusion of left knee, initial encounter: Secondary | ICD-10-CM | POA: Diagnosis not present

## 2020-09-17 DIAGNOSIS — I120 Hypertensive chronic kidney disease with stage 5 chronic kidney disease or end stage renal disease: Secondary | ICD-10-CM | POA: Diagnosis not present

## 2020-09-17 DIAGNOSIS — R0600 Dyspnea, unspecified: Secondary | ICD-10-CM | POA: Diagnosis not present

## 2020-09-17 DIAGNOSIS — K3184 Gastroparesis: Secondary | ICD-10-CM | POA: Diagnosis present

## 2020-09-17 DIAGNOSIS — F3111 Bipolar disorder, current episode manic without psychotic features, mild: Secondary | ICD-10-CM | POA: Diagnosis not present

## 2020-09-17 DIAGNOSIS — N289 Disorder of kidney and ureter, unspecified: Secondary | ICD-10-CM | POA: Diagnosis not present

## 2020-09-17 DIAGNOSIS — Z884 Allergy status to anesthetic agent status: Secondary | ICD-10-CM

## 2020-09-17 DIAGNOSIS — H547 Unspecified visual loss: Secondary | ICD-10-CM | POA: Diagnosis present

## 2020-09-17 DIAGNOSIS — E871 Hypo-osmolality and hyponatremia: Secondary | ICD-10-CM | POA: Diagnosis present

## 2020-09-17 DIAGNOSIS — T82858A Stenosis of vascular prosthetic devices, implants and grafts, initial encounter: Secondary | ICD-10-CM | POA: Diagnosis not present

## 2020-09-17 DIAGNOSIS — M25562 Pain in left knee: Secondary | ICD-10-CM

## 2020-09-17 DIAGNOSIS — R9431 Abnormal electrocardiogram [ECG] [EKG]: Secondary | ICD-10-CM | POA: Diagnosis present

## 2020-09-17 DIAGNOSIS — K2100 Gastro-esophageal reflux disease with esophagitis, without bleeding: Secondary | ICD-10-CM | POA: Diagnosis present

## 2020-09-17 DIAGNOSIS — I129 Hypertensive chronic kidney disease with stage 1 through stage 4 chronic kidney disease, or unspecified chronic kidney disease: Secondary | ICD-10-CM | POA: Diagnosis not present

## 2020-09-17 DIAGNOSIS — Y832 Surgical operation with anastomosis, bypass or graft as the cause of abnormal reaction of the patient, or of later complication, without mention of misadventure at the time of the procedure: Secondary | ICD-10-CM | POA: Diagnosis present

## 2020-09-17 DIAGNOSIS — Z6838 Body mass index (BMI) 38.0-38.9, adult: Secondary | ICD-10-CM

## 2020-09-17 DIAGNOSIS — E876 Hypokalemia: Secondary | ICD-10-CM | POA: Diagnosis not present

## 2020-09-17 DIAGNOSIS — Z8249 Family history of ischemic heart disease and other diseases of the circulatory system: Secondary | ICD-10-CM

## 2020-09-17 DIAGNOSIS — R45851 Suicidal ideations: Secondary | ICD-10-CM | POA: Diagnosis not present

## 2020-09-17 DIAGNOSIS — N183 Chronic kidney disease, stage 3 unspecified: Secondary | ICD-10-CM | POA: Diagnosis not present

## 2020-09-17 DIAGNOSIS — Z833 Family history of diabetes mellitus: Secondary | ICD-10-CM

## 2020-09-17 DIAGNOSIS — I509 Heart failure, unspecified: Secondary | ICD-10-CM | POA: Diagnosis not present

## 2020-09-17 DIAGNOSIS — Z91013 Allergy to seafood: Secondary | ICD-10-CM

## 2020-09-17 DIAGNOSIS — H409 Unspecified glaucoma: Secondary | ICD-10-CM | POA: Diagnosis present

## 2020-09-17 LAB — CBC WITH DIFFERENTIAL/PLATELET
Abs Immature Granulocytes: 0.02 10*3/uL (ref 0.00–0.07)
Basophils Absolute: 0 10*3/uL (ref 0.0–0.1)
Basophils Relative: 0 %
Eosinophils Absolute: 0.1 10*3/uL (ref 0.0–0.5)
Eosinophils Relative: 1 %
HCT: 28.3 % — ABNORMAL LOW (ref 39.0–52.0)
Hemoglobin: 9.8 g/dL — ABNORMAL LOW (ref 13.0–17.0)
Immature Granulocytes: 0 %
Lymphocytes Relative: 24 %
Lymphs Abs: 1.4 10*3/uL (ref 0.7–4.0)
MCH: 34.8 pg — ABNORMAL HIGH (ref 26.0–34.0)
MCHC: 34.6 g/dL (ref 30.0–36.0)
MCV: 100.4 fL — ABNORMAL HIGH (ref 80.0–100.0)
Monocytes Absolute: 0.6 10*3/uL (ref 0.1–1.0)
Monocytes Relative: 10 %
Neutro Abs: 3.8 10*3/uL (ref 1.7–7.7)
Neutrophils Relative %: 65 %
Platelets: 91 10*3/uL — ABNORMAL LOW (ref 150–400)
RBC: 2.82 MIL/uL — ABNORMAL LOW (ref 4.22–5.81)
RDW: 13.9 % (ref 11.5–15.5)
WBC: 5.8 10*3/uL (ref 4.0–10.5)
nRBC: 0 % (ref 0.0–0.2)

## 2020-09-17 LAB — VALPROIC ACID LEVEL: Valproic Acid Lvl: <10 ug/mL — ABNORMAL LOW (ref 50.0–100.0)

## 2020-09-17 LAB — SARS CORONAVIRUS 2 BY RT PCR (HOSPITAL ORDER, PERFORMED IN ~~LOC~~ HOSPITAL LAB): SARS Coronavirus 2: NEGATIVE

## 2020-09-17 LAB — COMPREHENSIVE METABOLIC PANEL
ALT: 16 U/L (ref 0–44)
AST: 15 U/L (ref 15–41)
Albumin: 3.7 g/dL (ref 3.5–5.0)
Alkaline Phosphatase: 40 U/L (ref 38–126)
Anion gap: 12 (ref 5–15)
BUN: 67 mg/dL — ABNORMAL HIGH (ref 6–20)
CO2: 23 mmol/L (ref 22–32)
Calcium: 8.5 mg/dL — ABNORMAL LOW (ref 8.9–10.3)
Chloride: 96 mmol/L — ABNORMAL LOW (ref 98–111)
Creatinine, Ser: 11.19 mg/dL — ABNORMAL HIGH (ref 0.61–1.24)
GFR, Estimated: 5 mL/min — ABNORMAL LOW (ref >60)
Glucose, Bld: 156 mg/dL — ABNORMAL HIGH (ref 70–99)
Potassium: 3.1 mmol/L — ABNORMAL LOW (ref 3.5–5.1)
Sodium: 131 mmol/L — ABNORMAL LOW (ref 135–145)
Total Bilirubin: 0.7 mg/dL (ref 0.3–1.2)
Total Protein: 6.9 g/dL (ref 6.5–8.1)

## 2020-09-17 LAB — ETHANOL: Alcohol, Ethyl (B): <10 mg/dL (ref <10)

## 2020-09-17 MED ORDER — LEVETIRACETAM IN NACL 500 MG/100ML IV SOLN
500.0000 mg | Freq: Two times a day (BID) | INTRAVENOUS | Status: DC
  Administered 2020-09-18 (×2): 500 mg via INTRAVENOUS
  Filled 2020-09-17 (×2): qty 100

## 2020-09-17 MED ORDER — ZIPRASIDONE HCL 40 MG PO CAPS
60.0000 mg | ORAL_CAPSULE | Freq: Two times a day (BID) | ORAL | Status: DC
  Administered 2020-09-18 – 2020-09-23 (×10): 60 mg via ORAL
  Filled 2020-09-17 (×12): qty 1

## 2020-09-17 MED ORDER — POLYETHYLENE GLYCOL 3350 17 G PO PACK
17.0000 g | PACK | Freq: Every day | ORAL | Status: DC | PRN
  Administered 2020-09-21 – 2020-09-23 (×2): 17 g via ORAL
  Filled 2020-09-17 (×2): qty 1

## 2020-09-17 MED ORDER — METHOCARBAMOL 500 MG PO TABS: 1000.0000 mg | ORAL_TABLET | Freq: Once | ORAL | Status: DC

## 2020-09-17 MED ORDER — LORAZEPAM 2 MG/ML IJ SOLN
2.0000 mg | INTRAMUSCULAR | Status: DC | PRN
  Administered 2020-09-18 – 2020-10-07 (×18): 2 mg via INTRAVENOUS
  Filled 2020-09-17 (×19): qty 1

## 2020-09-17 MED ORDER — INSULIN ASPART 100 UNIT/ML ~~LOC~~ SOLN
0.0000 [IU] | SUBCUTANEOUS | Status: DC
  Administered 2020-09-18 (×3): 2 [IU] via SUBCUTANEOUS
  Administered 2020-09-19: 3 [IU] via SUBCUTANEOUS
  Administered 2020-09-21: 2 [IU] via SUBCUTANEOUS
  Administered 2020-09-22: 3 [IU] via SUBCUTANEOUS
  Administered 2020-09-22 – 2020-09-25 (×8): 2 [IU] via SUBCUTANEOUS
  Administered 2020-09-25: 3 [IU] via SUBCUTANEOUS
  Administered 2020-09-26: 2 [IU] via SUBCUTANEOUS
  Administered 2020-09-26: 5 [IU] via SUBCUTANEOUS
  Administered 2020-09-26: 3 [IU] via SUBCUTANEOUS
  Administered 2020-09-27 (×2): 2 [IU] via SUBCUTANEOUS
  Administered 2020-09-27: 5 [IU] via SUBCUTANEOUS
  Administered 2020-09-27 – 2020-09-28 (×3): 2 [IU] via SUBCUTANEOUS
  Administered 2020-09-28: 3 [IU] via SUBCUTANEOUS
  Administered 2020-09-29: 2 [IU] via SUBCUTANEOUS
  Administered 2020-09-29: 3 [IU] via SUBCUTANEOUS
  Administered 2020-09-29 – 2020-09-30 (×4): 2 [IU] via SUBCUTANEOUS
  Administered 2020-09-30 (×3): 3 [IU] via SUBCUTANEOUS
  Administered 2020-10-01: 2 [IU] via SUBCUTANEOUS
  Administered 2020-10-01: 5 [IU] via SUBCUTANEOUS
  Administered 2020-10-02: 2 [IU] via SUBCUTANEOUS
  Administered 2020-10-02: 3 [IU] via SUBCUTANEOUS
  Administered 2020-10-02: 2 [IU] via SUBCUTANEOUS
  Administered 2020-10-03: 5 [IU] via SUBCUTANEOUS
  Administered 2020-10-03 (×2): 2 [IU] via SUBCUTANEOUS
  Administered 2020-10-03 – 2020-10-04 (×3): 3 [IU] via SUBCUTANEOUS
  Administered 2020-10-04: 2 [IU] via SUBCUTANEOUS
  Administered 2020-10-04: 3 [IU] via SUBCUTANEOUS
  Administered 2020-10-04: 2 [IU] via SUBCUTANEOUS
  Administered 2020-10-05 (×2): 3 [IU] via SUBCUTANEOUS
  Administered 2020-10-05 – 2020-10-06 (×3): 2 [IU] via SUBCUTANEOUS
  Administered 2020-10-06: 3 [IU] via SUBCUTANEOUS
  Administered 2020-10-06 (×2): 2 [IU] via SUBCUTANEOUS
  Administered 2020-10-06: 3 [IU] via SUBCUTANEOUS
  Administered 2020-10-07 – 2020-10-08 (×7): 2 [IU] via SUBCUTANEOUS
  Administered 2020-10-08: 5 [IU] via SUBCUTANEOUS
  Administered 2020-10-09: 2 [IU] via SUBCUTANEOUS
  Administered 2020-10-09 (×2): 3 [IU] via SUBCUTANEOUS
  Administered 2020-10-09: 2 [IU] via SUBCUTANEOUS
  Administered 2020-10-09: 3 [IU] via SUBCUTANEOUS

## 2020-09-17 MED ORDER — LORAZEPAM 2 MG/ML IJ SOLN
4.0000 mg | Freq: Once | INTRAMUSCULAR | Status: AC
  Administered 2020-09-17: 4 mg via INTRAVENOUS
  Filled 2020-09-17: qty 2

## 2020-09-17 MED ORDER — LORAZEPAM 2 MG/ML IJ SOLN
2.0000 mg | Freq: Once | INTRAMUSCULAR | Status: AC
  Administered 2020-09-17: 2 mg via INTRAMUSCULAR
  Filled 2020-09-17: qty 1

## 2020-09-17 MED ORDER — ACETAMINOPHEN 325 MG PO TABS
650.0000 mg | ORAL_TABLET | Freq: Four times a day (QID) | ORAL | Status: DC | PRN
  Administered 2020-09-20 – 2020-10-05 (×7): 650 mg via ORAL
  Filled 2020-09-17 (×7): qty 2

## 2020-09-17 MED ORDER — STERILE WATER FOR INJECTION IJ SOLN
INTRAMUSCULAR | Status: AC
  Filled 2020-09-17: qty 10

## 2020-09-17 MED ORDER — ZIPRASIDONE MESYLATE 20 MG IM SOLR
20.0000 mg | Freq: Once | INTRAMUSCULAR | Status: AC
  Administered 2020-09-17: 20 mg via INTRAMUSCULAR
  Filled 2020-09-17: qty 20

## 2020-09-17 MED ORDER — ACETAMINOPHEN 650 MG RE SUPP: 650.0000 mg | Freq: Four times a day (QID) | RECTAL | Status: DC | PRN

## 2020-09-17 MED ORDER — LABETALOL HCL 5 MG/ML IV SOLN: 10.0000 mg | INTRAVENOUS | Status: DC | PRN

## 2020-09-17 MED ORDER — POTASSIUM CHLORIDE CRYS ER 20 MEQ PO TBCR
20.0000 meq | EXTENDED_RELEASE_TABLET | Freq: Once | ORAL | Status: AC
  Administered 2020-09-18: 20 meq via ORAL
  Filled 2020-09-17: qty 1

## 2020-09-17 MED ORDER — DEXMEDETOMIDINE HCL IN NACL 200 MCG/50ML IV SOLN: 0.4000 ug/kg/h | INTRAVENOUS | Status: DC
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
## 2020-09-17 NOTE — ED Notes (Signed)
Pt walked out of room and was not redirectable back to room. Security and [employer] called to assist with getting patient back to room. Pt continues to try to leave ED. Geodon given. Still unable to redirect. Meal provided, toileting offered. Dr. Kathrynn Humble to bedside to assess pt. Order for ativan and restraints given. Ativan administered. B/l wrist restraints applied with discontinuation criteria explained. Pt placed on heart monitor. Will continue to monitor.
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
## 2020-09-17 NOTE — ED Notes (Signed)
IVC papers faxed to Magistrate and called to confirm.

## 2020-09-17 NOTE — ED Provider Notes (Addendum)
  Physical Exam  Ht '5\' 11"'$  (1.803 m)   Wt 124.7 kg   BMI 38.35 kg/m   Physical Exam  ED Course/Procedures     Procedures  MDM   Assuming care of patient from Dr. Alvino Chapel.   Patient in the ED for : hostile behavior. Really no chest pain per Dr. Alvino Chapel. Workup thus far shows : pending  Important pending results are all labs. He has HD hx and his graft is clotted.  According to Dr. Alvino Chapel, primary etiology for AMS is psych - r/o metabolic changes due to his ESRD hx. plan is to medically clear and admit for vascular issues.  - Follow up on labs to ensure no critical abnormality. - Pt has HD hx. Last HD was Friday. He has been non compliant over the last 2 weeks. EKG is reassuring. - Pt's graft is occluded - will need to go to [facility] for vascular care. Medicine will need to place the consult.    Varney Biles, MD 09/17/20 1516  Pt needed restraints for he is responding to internal stimuli and just wanting to get up and walk. He is a0 x 3. Answering my questions appropriately. Ordered some ativan. Need EKG. Stable for admission. Dr. Denton Brick made aware for the need for vascular consult for the possible thrombosis of the graft.    Varney Biles, MD 09/17/20 1902

## 2020-09-17 NOTE — ED Notes (Signed)
Called pt's sister [name] [phone] and updated on pt's condition. Received verbal consent to transfer pt to [facility].

## 2020-09-18 DIAGNOSIS — G9340 Encephalopathy, unspecified: Secondary | ICD-10-CM

## 2020-09-18 DIAGNOSIS — T82868A Thrombosis of vascular prosthetic devices, implants and grafts, initial encounter: Secondary | ICD-10-CM

## 2020-09-18 DIAGNOSIS — E118 Type 2 diabetes mellitus with unspecified complications: Secondary | ICD-10-CM | POA: Diagnosis not present

## 2020-09-18 DIAGNOSIS — Z789 Other specified health status: Secondary | ICD-10-CM | POA: Diagnosis not present

## 2020-09-18 DIAGNOSIS — E1165 Type 2 diabetes mellitus with hyperglycemia: Secondary | ICD-10-CM | POA: Diagnosis not present

## 2020-09-18 DIAGNOSIS — F310 Bipolar disorder, current episode hypomanic: Secondary | ICD-10-CM

## 2020-09-18 DIAGNOSIS — Z992 Dependence on renal dialysis: Secondary | ICD-10-CM

## 2020-09-18 DIAGNOSIS — N186 End stage renal disease: Secondary | ICD-10-CM

## 2020-09-18 LAB — HEMOGLOBIN A1C
Hgb A1c MFr Bld: 6.1 % — ABNORMAL HIGH (ref 4.8–5.6)
Mean Plasma Glucose: 128.37 mg/dL

## 2020-09-18 LAB — GLUCOSE, CAPILLARY
Glucose-Capillary: 113 mg/dL — ABNORMAL HIGH (ref 70–99)
Glucose-Capillary: 114 mg/dL — ABNORMAL HIGH (ref 70–99)
Glucose-Capillary: 119 mg/dL — ABNORMAL HIGH (ref 70–99)
Glucose-Capillary: 134 mg/dL — ABNORMAL HIGH (ref 70–99)
Glucose-Capillary: 140 mg/dL — ABNORMAL HIGH (ref 70–99)
Glucose-Capillary: 146 mg/dL — ABNORMAL HIGH (ref 70–99)

## 2020-09-18 LAB — CBC
HCT: 32.3 % — ABNORMAL LOW (ref 39.0–52.0)
Hemoglobin: 10.9 g/dL — ABNORMAL LOW (ref 13.0–17.0)
MCH: 33.6 pg (ref 26.0–34.0)
MCHC: 33.7 g/dL (ref 30.0–36.0)
MCV: 99.7 fL (ref 80.0–100.0)
Platelets: 105 10*3/uL — ABNORMAL LOW (ref 150–400)
RBC: 3.24 MIL/uL — ABNORMAL LOW (ref 4.22–5.81)
RDW: 13.9 % (ref 11.5–15.5)
WBC: 6.7 10*3/uL (ref 4.0–10.5)
nRBC: 0 % (ref 0.0–0.2)

## 2020-09-18 LAB — MAGNESIUM: Magnesium: 2 mg/dL (ref 1.7–2.4)

## 2020-09-18 LAB — BASIC METABOLIC PANEL
Anion gap: 17 — ABNORMAL HIGH (ref 5–15)
BUN: 64 mg/dL — ABNORMAL HIGH (ref 6–20)
CO2: 23 mmol/L (ref 22–32)
Calcium: 8.8 mg/dL — ABNORMAL LOW (ref 8.9–10.3)
Chloride: 96 mmol/L — ABNORMAL LOW (ref 98–111)
Creatinine, Ser: 10.51 mg/dL — ABNORMAL HIGH (ref 0.61–1.24)
GFR, Estimated: 5 mL/min — ABNORMAL LOW (ref >60)
Glucose, Bld: 134 mg/dL — ABNORMAL HIGH (ref 70–99)
Potassium: 3.2 mmol/L — ABNORMAL LOW (ref 3.5–5.1)
Sodium: 136 mmol/L (ref 135–145)

## 2020-09-18 LAB — MRSA PCR SCREENING: MRSA by PCR: NEGATIVE

## 2020-09-18 MED ORDER — ATORVASTATIN CALCIUM 10 MG PO TABS
10.0000 mg | ORAL_TABLET | Freq: Every day | ORAL | Status: DC
  Administered 2020-09-18 – 2020-10-12 (×25): 10 mg via ORAL
  Filled 2020-09-18 (×25): qty 1

## 2020-09-18 MED ORDER — CHLORHEXIDINE GLUCONATE CLOTH 2 % EX PADS
6.0000 | MEDICATED_PAD | Freq: Every day | CUTANEOUS | Status: DC
  Administered 2020-09-19 – 2020-09-20 (×2): 6 via TOPICAL

## 2020-09-18 MED ORDER — FOLIC ACID 1 MG PO TABS
1.0000 mg | ORAL_TABLET | Freq: Every day | ORAL | Status: DC
  Administered 2020-09-18 – 2020-10-13 (×25): 1 mg via ORAL
  Filled 2020-09-18 (×25): qty 1

## 2020-09-18 MED ORDER — VALPROATE SODIUM 100 MG/ML IV SOLN
2500.0000 mg | Freq: Once | INTRAVENOUS | Status: AC
  Administered 2020-09-18: 2500 mg via INTRAVENOUS
  Filled 2020-09-18: qty 25

## 2020-09-18 MED ORDER — AMLODIPINE BESYLATE 5 MG PO TABS
5.0000 mg | ORAL_TABLET | Freq: Every day | ORAL | Status: DC
  Administered 2020-09-18 – 2020-09-20 (×3): 5 mg via ORAL
  Filled 2020-09-18 (×3): qty 1

## 2020-09-18 MED ORDER — POTASSIUM CHLORIDE CRYS ER 20 MEQ PO TBCR
40.0000 meq | EXTENDED_RELEASE_TABLET | Freq: Once | ORAL | Status: AC
  Administered 2020-09-18: 40 meq via ORAL
  Filled 2020-09-18: qty 2

## 2020-09-18 MED ORDER — HYDRALAZINE HCL 20 MG/ML IJ SOLN
10.0000 mg | Freq: Four times a day (QID) | INTRAMUSCULAR | Status: DC | PRN
  Administered 2020-09-20: 10 mg via INTRAVENOUS
  Filled 2020-09-18: qty 1

## 2020-09-18 MED ORDER — VALPROATE SODIUM 100 MG/ML IV SOLN
750.0000 mg | Freq: Three times a day (TID) | INTRAVENOUS | Status: DC
  Administered 2020-09-19 – 2020-09-20 (×4): 750 mg via INTRAVENOUS
  Filled 2020-09-18 (×9): qty 7.5

## 2020-09-18 MED ORDER — DORZOLAMIDE HCL-TIMOLOL MAL 2-0.5 % OP SOLN
1.0000 [drp] | Freq: Two times a day (BID) | OPHTHALMIC | Status: DC
  Administered 2020-09-18 – 2020-10-13 (×48): 1 [drp] via OPHTHALMIC
  Filled 2020-09-18 (×2): qty 10

## 2020-09-18 MED ORDER — METOCLOPRAMIDE HCL 10 MG PO TABS
5.0000 mg | ORAL_TABLET | Freq: Once | ORAL | Status: AC
  Administered 2020-09-18: 5 mg via ORAL
  Filled 2020-09-18: qty 1

## 2020-09-18 MED ORDER — CHLORHEXIDINE GLUCONATE CLOTH 2 % EX PADS
6.0000 | MEDICATED_PAD | Freq: Every day | CUTANEOUS | Status: DC
  Administered 2020-09-18 – 2020-09-20 (×3): 6 via TOPICAL

## 2020-09-18 MED ORDER — PANTOPRAZOLE SODIUM 40 MG PO TBEC
40.0000 mg | DELAYED_RELEASE_TABLET | Freq: Two times a day (BID) | ORAL | Status: DC
  Administered 2020-09-18 – 2020-10-13 (×45): 40 mg via ORAL
  Filled 2020-09-18 (×45): qty 1

## 2020-09-18 MED ORDER — DARBEPOETIN ALFA 60 MCG/0.3ML IJ SOSY
60.0000 ug | PREFILLED_SYRINGE | INTRAMUSCULAR | Status: DC
  Administered 2020-09-19: 60 ug via INTRAVENOUS
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
## 2020-09-18 NOTE — Consult Note (Incomplete)
[facility] Renal Consultation Note  Indication for Consultation:  Management of ESRD/hemodialysis; anemia, hypertension/volume and secondary hyperparathyroidism  HPI: Timothy Clark is a 50 y.o. male.       Past Medical History:  Diagnosis Date  . Anemia   . Anxiety   . CHF (congestive heart failure) (HCC)    diastolic dysfunction  . Chronic abdominal pain   . COVID-19 07/2019  . Depression   . Esophagitis   . ESRD on hemodialysis [facility])    undergoing evaluation for transplant  . Eye hemorrhage   . Gastritis   . Gastroparesis   . GERD (gastroesophageal reflux disease)   . Glaucoma   . Helicobacter pylori (H. pylori) infection 2016   ERADICATION DOCUMENTED VIA STOOL TEST in AUG 2016 at [place]  . Hiccups   . Hypertension   . Nausea and vomiting    chronic, recurrent  . Neuropathy    feet  . Renal insufficiency   . Type 2 diabetes mellitus with complications (HCC)    diagnosed around age 23    Past Surgical History:  Procedure Laterality Date  . AMPUTATION TOE Right    great toe  . AV FISTULA INSERTION W/ RF MAGNETIC GUIDANCE Left 04/06/2019   Procedure: AV FISTULA INSERTION W/RF MAGNETIC GUIDANCE;  Surgeon: Katha Cabal, MD;  Location: [place] [facility];  Service: Cardiovascular;  Laterality: Left;  . AV FISTULA PLACEMENT Left 05/06/2019   Procedure: INSERTION OF ARTERIOVENOUS (AV) GORE-TEX GRAFT ARM ( FOREARM LOOP );  Surgeon: Katha Cabal, MD;  Location: [facility];  Service: Vascular;  Laterality: Left;  . AV FISTULA PLACEMENT Left 03/21/2020   Procedure: INSERTION OF ARTERIOVENOUS (AV) GORE-TEX GRAFT ARM ( BRACHIAL AXILLARY );  Surgeon: Katha Cabal, MD;  Location: [facility];  Service: Vascular;  Laterality: Left;  . CATARACT EXTRACTION W/PHACO Left 05/19/2016   Procedure: CATARACT EXTRACTION PHACO AND INTRAOCULAR LENS PLACEMENT LEFT EYE;  Surgeon: Tonny Branch, MD;  Location: [facility];  Service: Ophthalmology;  Laterality: Left;  CDE:  7.30  . CATARACT EXTRACTION W/PHACO Right 06/23/2016   Procedure: CATARACT EXTRACTION PHACO AND INTRAOCULAR LENS PLACEMENT RIGHT EYE CDE=9.87;  Surgeon: Tonny Branch, MD;  Location: [facility];  Service: Ophthalmology;  Laterality: Right;  right  . DIALYSIS/PERMA CATHETER REMOVAL N/A 07/19/2019   Procedure: DIALYSIS/PERMA CATHETER REMOVAL;  Surgeon: Katha Cabal, MD;  Location: [place] [facility];  Service: Cardiovascular;  Laterality: N/A;  . ESOPHAGOGASTRODUODENOSCOPY  2015   Dr. Britta Mccreedy  . ESOPHAGOGASTRODUODENOSCOPY N/A 10/26/2014   RMR: Distal esophagititis-likely reflux related although an element of pill induced injuruy no exclueded  status post biopsy. Diffusely abnormal gastric mucosa of uncertain signigicane -status post gastric biopsy. Focal area of excoriation in the cardia most consistant with a trauma of heaving.   . ESOPHAGOGASTRODUODENOSCOPY  08/2015   [facility]: mild esophagitis and old gastric contents  . EYE SURGERY Left 2015   stent placed to left eye  . EYE SURGERY Right 06/2019   per patient- to remove scar tissue  . INCISION AND DRAINAGE OF WOUND Right 07/16/2017   Procedure: IRRIGATION AND DEBRIDEMENT OF SOFT TISSUE OF ULCERATION RIGHT FOOT;  Surgeon: Caprice Beaver, DPM;  Location: [facility];  Service: Podiatry;  Laterality: Right;  . PERIPHERAL VASCULAR THROMBECTOMY Left 09/27/2019   Procedure: PERIPHERAL VASCULAR THROMBECTOMY;  Surgeon: Katha Cabal, MD;  Location: [place] [facility];  Service: Cardiovascular;  Laterality: Left;  . PERIPHERAL VASCULAR THROMBECTOMY Left 10/27/2019   Procedure: PERIPHERAL  VASCULAR THROMBECTOMY;  Surgeon: Algernon Huxley, MD;  Location: [place] [facility];  Service: Cardiovascular;  Laterality: Left;  . PERIPHERAL VASCULAR THROMBECTOMY Left 12/13/2019   Procedure: PERIPHERAL VASCULAR THROMBECTOMY;  Surgeon: Katha Cabal, MD;  Location: [place] [facility];  Service: Cardiovascular;  Laterality: Left;      Family History   Problem Relation Age of Onset  . Ovarian cancer Mother   . Heart attack Father   . Colon polyps Father   . Cervical cancer Sister   . Diabetes Sister   . Colon cancer Neg Hx       reports that he has never smoked. He has never used smokeless tobacco. He reports that he does not drink alcohol and does not use drugs.   Allergies  Allergen Reactions  . Donnatal [Phenobarbital-Belladonna Alk] Anaphylaxis and Other (See Comments)    Reaction to GI cocktail  . Fish Allergy Anaphylaxis, Hives and Rash  . Haldol [Haloperidol Lactate] Other (See Comments)    Chest Pain  . Lidocaine Anaphylaxis and Other (See Comments)    Reaction to GI cocktail  . Maalox [Calcium Carbonate Antacid] Anaphylaxis and Other (See Comments)    Reaction to GI cocktail  . Shellfish Allergy Hives    Per patient: Patient stated he breaks out in hives when eating shellfish  . Aspirin Hives and Itching  . Doxycycline Hives, Nausea And Vomiting and Other (See Comments)    Severe   . Keflex [Cephalexin] Hives, Itching and Nausea And Vomiting  . Marinol [Dronabinol] Nausea And Vomiting and Other (See Comments)    Caused worsening vomiting.   . Other Swelling  . Penicillins Hives, Itching, Other (See Comments) and Rash    Has patient had a PCN reaction causing immediate rash, facial/tongue/throat swelling, SOB or lightheadedness with hypotension: yes Has patient had a PCN reaction causing severe rash involving mucus membranes or skin necrosis: yes Has patient had a PCN reaction that required hospitalization no Has patient had a PCN reaction occurring within the last 10 years: yes If all of the above answers are "NO", then may proceed with Cephalospor  . Bactrim [Sulfamethoxazole-Trimethoprim] Itching    Prior to Admission medications   Medication Sig Start Date End Date Taking? Authorizing Provider  amLODipine (NORVASC) 5 MG tablet Take 5 mg by mouth daily.    [provider]  atorvastatin (LIPITOR) 10  MG tablet Take 10 mg by mouth at bedtime.     [provider]  brimonidine (ALPHAGAN) 0.15 % ophthalmic solution Place 1 drop into both eyes in the morning and at bedtime.     [provider]  carvedilol (COREG) 25 MG tablet Take 25 mg by mouth 2 (two) times daily with a meal.    [provider]  cloNIDine (CATAPRES) 0.2 MG tablet Take 0.2 mg by mouth 2 (two) times daily.     [provider]  cyanocobalamin (,VITAMIN B-12,) 1000 MCG/ML injection Inject 1,000 mcg into the muscle every 30 (thirty) days.  04/19/18   [provider]  divalproex (DEPAKOTE) 250 MG DR tablet Take 250 mg by mouth daily.    [provider]  dorzolamide-timolol (COSOPT) 22.3-6.8 MG/ML ophthalmic solution Place 1 drop into both eyes 2 (two) times daily.  01/16/20   [provider]  folic acid (FOLVITE) 1 MG tablet Take 1 mg by mouth daily.    [provider]  LEADER UNIFINE PENTIPS PLUS 32G X 4 MM MISC  05/17/20   [provider]  levETIRAcetam (KEPPRA) 500 MG tablet Take 500 mg by mouth 2 (two) times daily. 07/08/20   [provider]  metoCLOPramide (REGLAN) 5 MG tablet Take 1 tablet (5 mg total) by mouth every 12 (twelve) hours. 03/03/20   Barton Dubois, MD  NOVOLOG MIX 70/30 FLEXPEN (70-30) 100 UNIT/ML FlexPen 35-55 Units by Subconjunctival route See admin instructions. Inject 55 units subcutaneously in the morning & inject 35 units subcutaneously in the evening. 06/21/19   [provider]  ondansetron (ZOFRAN) 4 MG tablet Take 4 mg by mouth every 8 (eight) hours as needed for nausea or vomiting.    [provider]  pantoprazole (PROTONIX) 40 MG tablet Take 1 tablet (40 mg total) by mouth 2 (two) times daily before a meal. 08/24/18   Annitta Needs, NP  sertraline (ZOLOFT) 100 MG tablet Take 100 mg by mouth daily. 02/10/20   [provider]  sitaGLIPtin (JANUVIA) 25 MG tablet Take 25 mg by mouth daily.     [provider]  sucralfate (CARAFATE) 1 g tablet Take 1 g by mouth in the morning, at noon, and at bedtime.     [provider]  torsemide (DEMADEX) 20 MG tablet Take 40 mg by mouth daily.    [provider]  ziprasidone (GEODON) 60 MG capsule Take 60 mg by mouth 2 (two) times daily with a meal.    [provider]    {medication reviewed/display:[id number]}  Results for orders placed or performed during the hospital encounter of 09/17/20 (from the past 48 hour(s))  SARS Coronavirus 2 by RT PCR (hospital order, performed in [facility] hospital lab) Nasopharyngeal Nasopharyngeal Swab     Status: None   Collection Time: 09/17/20  2:13 PM   Specimen: Nasopharyngeal Swab  Result Value Ref Range   SARS Coronavirus 2 NEGATIVE NEGATIVE    Comment: (NOTE) SARS-CoV-2 target nucleic acids are NOT DETECTED.  The SARS-CoV-2 RNA is generally detectable in upper and lower respiratory specimens during the acute phase of infection. The lowest concentration of SARS-CoV-2 viral copies this assay can detect is 250 copies / mL. A negative result does not preclude SARS-CoV-2 infection and should not be used as the sole basis for treatment or other patient management decisions.  A negative result may occur with improper specimen collection / handling, submission of specimen other than nasopharyngeal swab, presence of viral mutation(s) within the areas targeted by this assay, and inadequate number of viral copies (<250 copies / mL). A negative result must be combined with clinical observations, patient history, and epidemiological information.  Fact Sheet for Patients:   StrictlyIdeas.no  Fact Sheet for Healthcare Providers: BankingDealers.co.za  This test is not yet approved or  cleared by the [place] FDA and has been authorized for detection and/or diagnosis of SARS-CoV-2 by FDA under an Emergency Use Authorization (EUA).   This EUA will remain in effect (meaning this test can be used) for the duration of the COVID-19 declaration under Section 564(b)(1) of the Act, 21 U.S.C. section 360bbb-3(b)(1), unless the authorization is terminated or revoked sooner.  Performed at [facility], [address], [place], [place] [address]   Comprehensive metabolic panel     Status: Abnormal   Collection Time: 09/17/20  4:39 PM  Result Value Ref Range   Sodium 131 (L) 135 - 145 mmol/L   Potassium 3.1 (L) 3.5 - 5.1 mmol/L   Chloride 96 (L) 98 - 111 mmol/L   CO2 23 22 - 32 mmol/L  Glucose, Bld 156 (H) 70 - 99 mg/dL    Comment: Glucose reference range applies only to samples taken after fasting for at least 8 hours.   BUN 67 (H) 6 - 20 mg/dL   Creatinine, Ser 11.19 (H) 0.61 - 1.24 mg/dL   Calcium 8.5 (L) 8.9 - 10.3 mg/dL   Total Protein 6.9 6.5 - 8.1 g/dL   Albumin 3.7 3.5 - 5.0 g/dL   AST 15 15 - 41 U/L   ALT 16 0 - 44 U/L   Alkaline Phosphatase 40 38 - 126 U/L   Total Bilirubin 0.7 0.3 - 1.2 mg/dL   GFR, Estimated 5 (L) >60 mL/min    Comment: (NOTE) Calculated using the CKD-EPI Creatinine Equation (2021)    Anion gap 12 5 - 15    Comment: Performed at [facility], [address] [place], [place] [address]  CBC with Differential     Status: Abnormal   Collection Time: 09/17/20  4:39 PM  Result Value Ref Range   WBC 5.8 4.0 - 10.5 K/uL   RBC 2.82 (L) 4.22 - 5.81 MIL/uL   Hemoglobin 9.8 (L) 13.0 - 17.0 g/dL   HCT 28.3 (L) 39.0 - 52.0 %   MCV 100.4 (H) 80.0 - 100.0 fL   MCH 34.8 (H) 26.0 - 34.0 pg   MCHC 34.6 30.0 - 36.0 g/dL   RDW 13.9 11.5 - 15.5 %   Platelets 91 (L) 150 - 400 K/uL    Comment: REPEATED TO VERIFY PLATELET COUNT CONFIRMED BY SMEAR SPECIMEN CHECKED FOR CLOTS    nRBC 0.0 0.0 - 0.2 %   Neutrophils Relative % 65 %   Neutro Abs 3.8 1.7 - 7.7 K/uL   Lymphocytes Relative 24 %   Lymphs Abs 1.4 0.7 - 4.0 K/uL   Monocytes Relative 10 %   Monocytes Absolute 0.6 0.1 - 1.0 K/uL   Eosinophils  Relative 1 %   Eosinophils Absolute 0.1 0.0 - 0.5 K/uL   Basophils Relative 0 %   Basophils Absolute 0.0 0.0 - 0.1 K/uL   Immature Granulocytes 0 %   Abs Immature Granulocytes 0.02 0.00 - 0.07 K/uL    Comment: Performed at [facility], [address] [place], [place] [address]  Ethanol     Status: None   Collection Time: 09/17/20  4:39 PM  Result Value Ref Range   Alcohol, Ethyl (B) <10 <10 mg/dL    Comment: (NOTE) Lowest detectable limit for serum alcohol is 10 mg/dL.  For medical purposes only. Performed at [facility], [address] [place],  [address]   Valproic acid level     Status: Abnormal   Collection Time: 09/17/20  4:39 PM  Result Value Ref Range   Valproic Acid Lvl <10 (L) 50.0 - 100.0 ug/mL    Comment: Performed at [facility], [address] [place],  [address]  Glucose, capillary     Status: Abnormal   Collection Time: 09/18/20 12:12 AM  Result Value Ref Range   Glucose-Capillary 119 (H) 70 - 99 mg/dL    Comment: Glucose reference range applies only to samples taken after fasting for at least 8 hours.  Magnesium     Status: None   Collection Time: 09/18/20  3:17 AM  Result Value Ref Range   Magnesium 2.0 1.7 - 2.4 mg/dL    Comment: Performed at [facility] [address], [place],  [address]  Hemoglobin A1c     Status: Abnormal   Collection  Time: 09/18/20  3:17 AM  Result Value Ref Range   Hgb A1c MFr Bld 6.1 (H) 4.8 - 5.6 %    Comment: (NOTE) Pre diabetes:          5.7%-6.4%  Diabetes:              >6.4%  Glycemic control for   <7.0% adults with diabetes    Mean Plasma Glucose 128.37 mg/dL    Comment: Performed at [facility] [address], [place], [place] [address]  Basic metabolic panel     Status: Abnormal   Collection Time: 09/18/20  3:17 AM  Result Value Ref Range   Sodium 136 135 - 145 mmol/L   Potassium 3.2 (L) 3.5 - 5.1 mmol/L   Chloride 96 (L) 98 - 111 mmol/L   CO2 23 22 - 32 mmol/L   Glucose, Bld  134 (H) 70 - 99 mg/dL    Comment: Glucose reference range applies only to samples taken after fasting for at least 8 hours.   BUN 64 (H) 6 - 20 mg/dL   Creatinine, Ser 10.51 (H) 0.61 - 1.24 mg/dL   Calcium 8.8 (L) 8.9 - 10.3 mg/dL   GFR, Estimated 5 (L) >60 mL/min    Comment: (NOTE) Calculated using the CKD-EPI Creatinine Equation (2021)    Anion gap 17 (H) 5 - 15    Comment: Performed at [facility] [address] [place], [place] [address]  CBC     Status: Abnormal   Collection Time: 09/18/20  3:17 AM  Result Value Ref Range   WBC 6.7 4.0 - 10.5 K/uL   RBC 3.24 (L) 4.22 - 5.81 MIL/uL   Hemoglobin 10.9 (L) 13.0 - 17.0 g/dL   HCT 32.3 (L) 39.0 - 52.0 %   MCV 99.7 80.0 - 100.0 fL   MCH 33.6 26.0 - 34.0 pg   MCHC 33.7 30.0 - 36.0 g/dL   RDW 13.9 11.5 - 15.5 %   Platelets 105 (L) 150 - 400 K/uL    Comment: REPEATED TO VERIFY PLATELET COUNT CONFIRMED BY SMEAR SPECIMEN CHECKED FOR CLOTS Immature Platelet Fraction may be clinically indicated, consider ordering this additional test [id number]    nRBC 0.0 0.0 - 0.2 %    Comment: Performed at [facility], [place] [address], [place], [place] [address]  Glucose, capillary     Status: Abnormal   Collection Time: 09/18/20  3:58 AM  Result Value Ref Range   Glucose-Capillary 146 (H) 70 - 99 mg/dL    Comment: Glucose reference range applies only to samples taken after fasting for at least 8 hours.  Glucose, capillary     Status: Abnormal   Collection Time: 09/18/20  8:12 AM  Result Value Ref Range   Glucose-Capillary 134 (H) 70 - 99 mg/dL    Comment: Glucose reference range applies only to samples taken after fasting for at least 8 hours.   .  ROS:  {ros master:310782}  Physical Exam: Vitals:   09/18/20 0402 09/18/20 0802  BP: (!) 112/93 (!) (P) 163/94  Pulse: 89   Resp: 20   Temp: 98.9 F (37.2 C) 98.9 F (37.2 C)  SpO2: 100%      General: *** HEENT: *** Eyes: *** Neck: *** Heart: *** Lungs: *** Abdomen:  *** Extremities: *** Skin: *** Neuro: *** Dialysis Access: ***   Dialysis Orders: Center: [facility]  MWF . EDW 127kg HD Bath 3K/2.5Ca  Time 4:15 Heparin 1000 unit  bolus then 600 units/hr. Access LUE AVG BFR 400 DFR 600    Hectoral 2 mcg IV/HD Epogen 1000   Units IV/HD  Venofer  50 mg IV weekly   Assessment/Plan 1. *** 2. ESRD -  *** 3. Hypertension/volume  - *** 4. Anemia  - *** 5. Metabolic bone disease -  *** 6. Nutrition - ***  Ernest Haber, PA-C [facility] [phone] 09/18/2020, 11:16 AM

## 2020-09-18 NOTE — Consult Note (Signed)
Chief Complaint: Patient was seen in consultation today for left arm fistulagram with possible thrombolysis/thrombectomy/stenting or dialysis catheter placement Chief Complaint  Patient presents with  . Chest Pain  . Suicidal    Referring Physician(s): Rai,R  Supervising Physician: Markus Daft  Patient Status: [facility] - In-pt  History of Present Illness: Timothy Clark is a 50 y.o. male with past medical history significant for anxiety, CHF, prior COVID-19, anxiety/depression, end-stage renal disease with left upper extremity AV graft, GERD, glaucoma, hypertension, diabetes, neuropathy who was transferred from [facility] for thrombosed AV graft and suicidal/combative behavior.  Patient is encephalopathic and not responsive.  He is in upper and lower extremity but restraints.  Left arm fistula was placed in August 2020 with insertion of AV Gore-Tex graft forearm loop in September 2020 and brachial/axillary AV Gore-Tex graft placement in August 2021.  Patient has has undergone prior left upper extremity thrombectomies in February, March and May 2021 at [facility].  The left brachial/axillary AV graft was patent on last study in December 2021 by Dr. Lucky Cowboy.  Request now received for left upper arm AV graft thrombolysis.  Past Medical History:  Diagnosis Date  . Anemia   . Anxiety   . CHF (congestive heart failure) (HCC)    diastolic dysfunction  . Chronic abdominal pain   . COVID-19 07/2019  . Depression   . Esophagitis   . ESRD on hemodialysis [facility])    undergoing evaluation for transplant  . Eye hemorrhage   . Gastritis   . Gastroparesis   . GERD (gastroesophageal reflux disease)   . Glaucoma   . Helicobacter pylori (H. pylori) infection 2016   ERADICATION DOCUMENTED VIA STOOL TEST in AUG 2016 at [place]  . Hiccups   . Hypertension   . Nausea and vomiting    chronic, recurrent  . Neuropathy    feet  . Renal insufficiency   . Type 2 diabetes mellitus with complications  (HCC)    diagnosed around age 38    Past Surgical History:  Procedure Laterality Date  . AMPUTATION TOE Right    great toe  . AV FISTULA INSERTION W/ RF MAGNETIC GUIDANCE Left 04/06/2019   Procedure: AV FISTULA INSERTION W/RF MAGNETIC GUIDANCE;  Surgeon: Katha Cabal, MD;  Location: [place] [facility];  Service: Cardiovascular;  Laterality: Left;  . AV FISTULA PLACEMENT Left 05/06/2019   Procedure: INSERTION OF ARTERIOVENOUS (AV) GORE-TEX GRAFT ARM ( FOREARM LOOP );  Surgeon: Katha Cabal, MD;  Location: [facility] ORS;  Service: Vascular;  Laterality: Left;  . AV FISTULA PLACEMENT Left 03/21/2020   Procedure: INSERTION OF ARTERIOVENOUS (AV) GORE-TEX GRAFT ARM ( BRACHIAL AXILLARY );  Surgeon: Katha Cabal, MD;  Location: [facility] ORS;  Service: Vascular;  Laterality: Left;  . CATARACT EXTRACTION W/PHACO Left 05/19/2016   Procedure: CATARACT EXTRACTION PHACO AND INTRAOCULAR LENS PLACEMENT LEFT EYE;  Surgeon: Tonny Branch, MD;  [facility]: [facility];  Service: Ophthalmology;  Laterality: Left;  CDE: 7.30  . CATARACT EXTRACTION W/PHACO Right 06/23/2016   Procedure: CATARACT EXTRACTION PHACO AND INTRAOCULAR LENS PLACEMENT RIGHT EYE CDE=9.87;  Surgeon: Tonny Branch, MD;  Location: [facility];  Service: Ophthalmology;  Laterality: Right;  right  . DIALYSIS/PERMA CATHETER REMOVAL N/A 07/19/2019   Procedure: DIALYSIS/PERMA CATHETER REMOVAL;  Surgeon: Katha Cabal, MD;  Location: [place] [facility];  Service: Cardiovascular;  Laterality: N/A;  . ESOPHAGOGASTRODUODENOSCOPY  2015   Dr. Britta Mccreedy  . ESOPHAGOGASTRODUODENOSCOPY N/A 10/26/2014   RMR: Distal esophagititis-likely reflux  related although an element of pill induced injuruy no exclueded  status post biopsy. Diffusely abnormal gastric mucosa of uncertain signigicane -status post gastric biopsy. Focal area of excoriation in the cardia most consistant with a trauma of heaving.   . ESOPHAGOGASTRODUODENOSCOPY  08/2015   [facility]: mild esophagitis and old  gastric contents  . EYE SURGERY Left 2015   stent placed to left eye  . EYE SURGERY Right 06/2019   per patient- to remove scar tissue  . INCISION AND DRAINAGE OF WOUND Right 07/16/2017   Procedure: IRRIGATION AND DEBRIDEMENT OF SOFT TISSUE OF ULCERATION RIGHT FOOT;  Surgeon: Caprice Beaver, DPM;  Location: [facility];  Service: Podiatry;  Laterality: Right;  . PERIPHERAL VASCULAR THROMBECTOMY Left 09/27/2019   Procedure: PERIPHERAL VASCULAR THROMBECTOMY;  Surgeon: Katha Cabal, MD;  Location: [place] [facility];  Service: Cardiovascular;  Laterality: Left;  . PERIPHERAL VASCULAR THROMBECTOMY Left 10/27/2019   Procedure: PERIPHERAL VASCULAR THROMBECTOMY;  Surgeon: Algernon Huxley, MD;  Location: [place] [facility];  Service: Cardiovascular;  Laterality: Left;  . PERIPHERAL VASCULAR THROMBECTOMY Left 12/13/2019   Procedure: PERIPHERAL VASCULAR THROMBECTOMY;  Surgeon: Katha Cabal, MD;  Location: [place] [facility];  Service: Cardiovascular;  Laterality: Left;    Allergies: Donnatal [phenobarbital-belladonna alk], Fish allergy, Haldol [haloperidol lactate], Lidocaine, Maalox [calcium carbonate antacid], Shellfish allergy, Aspirin, Doxycycline, Keflex [cephalexin], Marinol [dronabinol], Other, Penicillins, and Bactrim [sulfamethoxazole-trimethoprim]  Medications: Prior to Admission medications   Medication Sig Start Date End Date Taking? Authorizing Provider  amLODipine (NORVASC) 5 MG tablet Take 5 mg by mouth daily.    [provider]  atorvastatin (LIPITOR) 10 MG tablet Take 10 mg by mouth at bedtime.     [provider]  brimonidine (ALPHAGAN) 0.15 % ophthalmic solution Place 1 drop into both eyes in the morning and at bedtime.     [provider]  carvedilol (COREG) 25 MG tablet Take 25 mg by mouth 2 (two) times daily with a meal.    [provider]  cloNIDine (CATAPRES) 0.2 MG tablet Take 0.2 mg by mouth 2 (two) times daily.     [provider]  cyanocobalamin (,VITAMIN B-12,) 1000 MCG/ML injection Inject 1,000 mcg into the muscle every 30 (thirty) days.  04/19/18   [provider]  divalproex (DEPAKOTE) 250 MG DR tablet Take 250 mg by mouth daily.    [provider]  dorzolamide-timolol (COSOPT) 22.3-6.8 MG/ML ophthalmic solution Place 1 drop into both eyes 2 (two) times daily.  01/16/20   [provider]  folic acid (FOLVITE) 1 MG tablet Take 1 mg by mouth daily.    [provider]  LEADER UNIFINE PENTIPS PLUS 32G X 4 MM MISC  05/17/20   [provider]  levETIRAcetam (KEPPRA) 500 MG tablet Take 500 mg by mouth 2 (two) times daily. 07/08/20   [provider]  metoCLOPramide (REGLAN) 5 MG tablet Take 1 tablet (5 mg total) by mouth every 12 (twelve) hours. 03/03/20   Barton Dubois, MD  NOVOLOG MIX 70/30 FLEXPEN (70-30) 100 UNIT/ML FlexPen 35-55 Units by Subconjunctival route See admin instructions. Inject 55 units subcutaneously in the morning & inject 35 units subcutaneously in the evening. 06/21/19   [provider]  ondansetron (ZOFRAN) 4 MG tablet Take 4 mg by mouth every 8 (eight) hours as needed for nausea or vomiting.    [provider]  pantoprazole (PROTONIX) 40 MG tablet Take 1 tablet (40 mg total) by mouth 2 (two) times  daily before a meal. 08/24/18   Annitta Needs, NP  sertraline (ZOLOFT) 100 MG tablet Take 100 mg by mouth daily. 02/10/20   [provider]  sitaGLIPtin (JANUVIA) 25 MG tablet Take 25 mg by mouth daily.     [provider]  sucralfate (CARAFATE) 1 g tablet Take 1 g by mouth in the morning, at noon, and at bedtime.     [provider]  torsemide (DEMADEX) 20 MG tablet Take 40 mg by mouth daily.    [provider]  ziprasidone (GEODON) 60 MG capsule Take 60 mg by mouth 2 (two) times daily with a meal.    [provider]     Family History  Problem Relation Age of Onset  . Ovarian cancer  Mother   . Heart attack Father   . Colon polyps Father   . Cervical cancer Sister   . Diabetes Sister   . Colon cancer Neg Hx     Social History   Socioeconomic History  . Marital status: Single    Spouse name: Not on file  . Number of children: Not on file  . Years of education: Not on file  . Highest education level: Not on file  Occupational History  . Not on file  Tobacco Use  . Smoking status: Never Smoker  . Smokeless tobacco: Never Used  Vaping Use  . Vaping Use: Never used  Substance and Sexual Activity  . Alcohol use: No    Alcohol/week: 0.0 standard drinks  . Drug use: No  . Sexual activity: Yes  Other Topics Concern  . Not on file  Social History Narrative   Lives with dad and sister   Social Determinants of Health   Financial Resource Strain: Not on file  Food Insecurity: Not on file  Transportation Needs: Not on file  Physical Activity: Not on file  Stress: Not on file  Social Connections: Not on file      Review of Systems unable to obtain; pt sedated/encephalopathic  Vital Signs: BP (!) (P) 163/94 (BP Location: Right Arm)   Pulse 89   Temp 98.9 F (37.2 C) (Oral)   Resp 20   Ht $R'5\' 11"'pb$  (1.803 m)   Wt 275 lb (124.7 kg)   SpO2 100%   BMI 38.35 kg/m   Physical Exam patient sedated, in upper and lower extremity restraints; chest clear to auscultation bilaterally anteriorly.  Heart with regular rate and rhythm.  Abdomen soft, positive bowel sounds, nontender.  No significant lower extremity edema.  No thrill or bruit in left arm AV graft  Imaging: DG Chest Portable 1 View  Result Date: 09/17/2020 CLINICAL DATA:  Chest pain, CHF EXAM: PORTABLE CHEST 1 VIEW COMPARISON:  Chest radiograph July 22, 2020 FINDINGS: Apparent cardiac enlargement due to technique. Low lung volumes. No focal consolidation. No pleural effusion. No pneumothorax. The visualized skeletal structures are unchanged. IMPRESSION: 1. Low lung volumes with bibasilar subsegmental  atelectasis. No focal consolidation. Electronically Signed   By: Dahlia Bailiff MD   On: 09/17/2020 15:29    Labs:  CBC: Recent Labs    07/24/20 0341 07/25/20 0511 09/17/20 1639 09/18/20 0317  WBC 7.5 7.7 5.8 6.7  HGB 10.0* 10.1* 9.8* 10.9*  HCT 29.7* 29.8* 28.3* 32.3*  PLT 136* 123* 91* 105*    COAGS: Recent Labs    02/16/20 1144 03/01/20 2120 03/20/20 1127  INR 1.0 1.2 1.0  APTT 35  --  37*    BMP: Recent Labs  03/01/20 2120 03/02/20 0612 03/03/20 0459 03/20/20 1127 03/21/20 1139 07/25/20 0511 07/26/20 0401 09/17/20 1639 09/18/20 0317  NA 135 137 137 133*   < > 135 134* 131* 136  K 3.1* 2.9* 3.1* 3.2*   < > 3.0* 2.9* 3.1* 3.2*  CL 94* 96* 95* 93*   < > 97* 96* 96* 96*  CO2 21* _0 < > _1 GLUCOSE 277* 284* 233* 237*   < > 171* 89 156* 134*  BUN 49* 57* 47* 35*   < > 51* 32* 67* 64*  CALCIUM 9.3 8.9 9.3 8.9   < > 8.8* 8.8* 8.5* 8.8*  CREATININE 8.06* 8.67* 8.44* 5.85*   < > 7.44* 5.70* 11.19* 10.51*  GFRNONAA 7* 6* 7* 10*   < > 8* 11* 5* 5*  GFRAA 8* 7* 8* 12*  --   --   --   --   --    < > = values in this interval not displayed.    LIVER FUNCTION TESTS: Recent Labs    07/23/20 1037 07/25/20 0511 07/26/20 0401 09/17/20 1639  BILITOT 1.2 1.1 0.6 0.7  AST 39 _2 ALT _3 ALKPHOS 42 36* 39 40  PROT 7.7 7.0 7.6 6.9  ALBUMIN 4.0 3.7 3.8 3.7    TUMOR MARKERS: No results for input(s): AFPTM, CEA, CA199, CHROMGRNA in the last 8760 hours.  Assessment and Plan: 50 y.o. male with past medical history significant for anxiety, CHF, prior COVID-19, anxiety/depression, end-stage renal disease with left upper extremity AV graft, GERD, glaucoma, hypertension, diabetes, neuropathy who was transferred from [facility] for thrombosed AV graft and suicidal/combative behavior.  Patient is encephalopathic and not responsive.  He is in upper and lower extremity but restraints.  Left arm fistula was placed in August 2020 with  insertion of AV Gore-Tex graft forearm loop in September 2020 and brachial/axillary AV Gore-Tex graft placement in August 2021.  Patient has has undergone prior left upper extremity thrombectomies in February, March and May 2021 at [facility].  The left brachial/axillary AV graft was patent on last study in December 2021 by Dr. Lucky Cowboy.  Request now received for left arm AV graft thrombolysis. K 3.2, creat 10.51 today. Details/risks of shuntogram with possible graft thrombolysis/thrombectomy/stent placement for possible dialysis cath placement including but not limited to, internal bleeding, infection, injury to adjacent structures discussed with patient's sister [name] with her understanding and consent.    Thank you for this interesting consult.  I greatly enjoyed meeting Timothy Clark and look forward to participating in their care.  A copy of this report was sent to the requesting provider on this date.  Electronically Signed: D. Rowe Robert, PA-C 09/18/2020, 9:54 AM   I spent a total of 30 minutes    in face to face in clinical consultation, greater than 50% of which was counseling/coordinating care for left arm shuntogram with possible thrombolysis/thrombectomy/stent placement and/or dialysis catheter placement

## 2020-09-18 NOTE — Consult Note (Signed)
ASSESSMENT & PLAN:  50 y.o. male with thrombosed left upper extremity AVG. Last HD Friday. Recommend consultation with interventional radiology to evaluate for percutaneous thrombectomy. We are available should new access need to be created.   CHIEF COMPLAINT:   Thrombosed LUE AVG  HISTORY:  HISTORY OF PRESENT ILLNESS: Timothy Clark is a 50 y.o. male transferred from [place] for thrombosed AVG, combative behavior. He is encephalopathic on my evaluation, and not responsive. He is in upper extremity and belt restraints. Per admission H&P he was admitted for suicidal and homicidal ideation. All history is per chart review and discussion with staff.   Past Medical History:  Diagnosis Date   Anemia    Anxiety    CHF (congestive heart failure) (HCC)    diastolic dysfunction   Chronic abdominal pain    COVID-19 07/2019   Depression    Esophagitis    ESRD on hemodialysis (HCC)    undergoing evaluation for transplant   Eye hemorrhage    Gastritis    Gastroparesis    GERD (gastroesophageal reflux disease)    Glaucoma    Helicobacter pylori (H. pylori) infection 2016   ERADICATION DOCUMENTED VIA STOOL TEST in AUG 2016 at [facility]   Hiccups    Hypertension    Nausea and vomiting    chronic, recurrent   Neuropathy    feet   Renal insufficiency    Type 2 diabetes mellitus with complications ([place])    diagnosed around age 50    Past Surgical History:  Procedure Laterality Date   AMPUTATION TOE Right    great toe   AV FISTULA INSERTION W/ RF MAGNETIC GUIDANCE Left 04/06/2019   Procedure: AV FISTULA INSERTION W/RF MAGNETIC GUIDANCE;  Surgeon: Katha Cabal, MD;  Location: [place] [facility];  Service: Cardiovascular;  Laterality: Left;   AV FISTULA PLACEMENT Left 05/06/2019   Procedure: INSERTION OF ARTERIOVENOUS (AV) GORE-TEX GRAFT ARM ( FOREARM LOOP );  Surgeon: Katha Cabal, MD;  Location: [facility] ORS;  Service: Vascular;  Laterality:  Left;   AV FISTULA PLACEMENT Left 03/21/2020   Procedure: INSERTION OF ARTERIOVENOUS (AV) GORE-TEX GRAFT ARM ( BRACHIAL AXILLARY );  Surgeon: Katha Cabal, MD;  Location: [facility] ORS;  Service: Vascular;  Laterality: Left;   CATARACT EXTRACTION W/PHACO Left 05/19/2016   Procedure: CATARACT EXTRACTION PHACO AND INTRAOCULAR LENS PLACEMENT LEFT EYE;  Surgeon: Tonny Branch, MD;  Location: [facility];  Service: Ophthalmology;  Laterality: Left;  CDE: 7.30   CATARACT EXTRACTION W/PHACO Right 06/23/2016   Procedure: CATARACT EXTRACTION PHACO AND INTRAOCULAR LENS PLACEMENT RIGHT EYE CDE=9.87;  Surgeon: Tonny Branch, MD;  Location: [facility];  Service: Ophthalmology;  Laterality: Right;  right   DIALYSIS/PERMA CATHETER REMOVAL N/A 07/19/2019   Procedure: DIALYSIS/PERMA CATHETER REMOVAL;  Surgeon: Katha Cabal, MD;  Location: [place] [facility];  Service: Cardiovascular;  Laterality: N/A;   ESOPHAGOGASTRODUODENOSCOPY  2015   Dr. Britta Mccreedy   ESOPHAGOGASTRODUODENOSCOPY N/A 10/26/2014   RMR: Distal esophagititis-likely reflux related although an element of pill induced injuruy no exclueded  status post biopsy. Diffusely abnormal gastric mucosa of uncertain signigicane -status post gastric biopsy. Focal area of excoriation in the cardia most consistant with a trauma of heaving.    ESOPHAGOGASTRODUODENOSCOPY  08/2015   [facility]: mild esophagitis and old gastric contents   EYE SURGERY Left 2015   stent placed to left eye   EYE SURGERY Right 06/2019   per patient- to remove scar tissue  INCISION AND DRAINAGE OF WOUND Right 07/16/2017   Procedure: IRRIGATION AND DEBRIDEMENT OF SOFT TISSUE OF ULCERATION RIGHT FOOT;  Surgeon: Caprice Beaver, DPM;  Location: [facility];  Service: Podiatry;  Laterality: Right;   PERIPHERAL VASCULAR THROMBECTOMY Left 09/27/2019   Procedure: PERIPHERAL VASCULAR THROMBECTOMY;  Surgeon: Katha Cabal, MD;  Location: [place] [facility];  Service: Cardiovascular;  Laterality:  Left;   PERIPHERAL VASCULAR THROMBECTOMY Left 10/27/2019   Procedure: PERIPHERAL VASCULAR THROMBECTOMY;  Surgeon: Algernon Huxley, MD;  Location: [place] [facility];  Service: Cardiovascular;  Laterality: Left;   PERIPHERAL VASCULAR THROMBECTOMY Left 12/13/2019   Procedure: PERIPHERAL VASCULAR THROMBECTOMY;  Surgeon: Katha Cabal, MD;  Location: [place] [facility];  Service: Cardiovascular;  Laterality: Left;    Family History  Problem Relation Age of Onset   Ovarian cancer Mother    Heart attack Father    Colon polyps Father    Cervical cancer Sister    Diabetes Sister    Colon cancer Neg Hx     Social History   Socioeconomic History   Marital status: Single    Spouse name: Not on file   Number of children: Not on file   Years of education: Not on file   Highest education level: Not on file  Occupational History   Not on file  Tobacco Use   Smoking status: Never Smoker   Smokeless tobacco: Never Used  Vaping Use   Vaping Use: Never used  Substance and Sexual Activity   Alcohol use: No    Alcohol/week: 0.0 standard drinks   Drug use: No   Sexual activity: Yes  Other Topics Concern   Not on file  Social History Narrative   Lives with dad and sister   Social Determinants of Health   Financial Resource Strain: Not on file  Food Insecurity: Not on file  Transportation Needs: Not on file  Physical Activity: Not on file  Stress: Not on file  Social Connections: Not on file  Intimate Partner Violence: Not on file    Allergies  Allergen Reactions   Donnatal [Phenobarbital-Belladonna Alk] Anaphylaxis and Other (See Comments)    Reaction to GI cocktail   Fish Allergy Anaphylaxis, Hives and Rash   Haldol [Haloperidol Lactate] Other (See Comments)    Chest Pain   Lidocaine Anaphylaxis and Other (See Comments)    Reaction to GI cocktail   Maalox [Calcium Carbonate Antacid] Anaphylaxis and Other (See Comments)    Reaction to GI cocktail    Shellfish Allergy Hives    Per patient: Patient stated he breaks out in hives when eating shellfish   Aspirin Hives and Itching   Doxycycline Hives, Nausea And Vomiting and Other (See Comments)    Severe    Keflex [Cephalexin] Hives, Itching and Nausea And Vomiting   Marinol [Dronabinol] Nausea And Vomiting and Other (See Comments)    Caused worsening vomiting.    Other Swelling   Penicillins Hives, Itching, Other (See Comments) and Rash    Has patient had a PCN reaction causing immediate rash, facial/tongue/throat swelling, SOB or lightheadedness with hypotension: yes Has patient had a PCN reaction causing severe rash involving mucus membranes or skin necrosis: yes Has patient had a PCN reaction that required hospitalization no Has patient had a PCN reaction occurring within the last 10 years: yes If all of the above answers are "NO", then may proceed with Cephalospor   Bactrim [Sulfamethoxazole-Trimethoprim] Itching    Current Facility-Administered Medications  Medication Dose  Route Frequency Provider Last Rate Last Admin   acetaminophen (TYLENOL) tablet 650 mg  650 mg Oral Q6H PRN Emokpae, Ejiroghene E, MD       Or   acetaminophen (TYLENOL) suppository 650 mg  650 mg Rectal Q6H PRN Emokpae, Ejiroghene E, MD       Chlorhexidine Gluconate Cloth 2 % PADS 6 each  6 each Topical Daily Rai, Ripudeep K, MD   6 each at 09/18/20 0819   hydrALAZINE (APRESOLINE) injection 10 mg  10 mg Intravenous Q6H PRN Rai, Ripudeep K, MD       insulin aspart (novoLOG) injection 0-15 Units  0-15 Units Subcutaneous Q4H Emokpae, Ejiroghene E, MD   2 Units at 09/18/20 0818   labetalol (NORMODYNE) injection 10 mg  10 mg Intravenous Q2H PRN Emokpae, Ejiroghene E, MD       levETIRAcetam (KEPPRA) IVPB 500 mg/100 mL premix  500 mg Intravenous Q12H Emokpae, Ejiroghene E, MD 400 mL/hr at 09/18/20 0007 500 mg at 09/18/20 0007   LORazepam (ATIVAN) injection 2 mg  2 mg Intravenous Q4H PRN Emokpae,  Ejiroghene E, MD   2 mg at 09/18/20 0655   polyethylene glycol (MIRALAX / GLYCOLAX) packet 17 g  17 g Oral Daily PRN Emokpae, Ejiroghene E, MD       ziprasidone (GEODON) capsule 60 mg  60 mg Oral BID WC Emokpae, Ejiroghene E, MD   60 mg at 09/18/20 0819    REVIEW OF SYSTEMS:  Unable to obtain - encephalopathic  PHYSICAL EXAM:   Vitals:   09/17/20 2307 09/17/20 2308 09/18/20 0402 09/18/20 0802  BP: 138/81  (!) 112/93   Pulse: 66  89   Resp: '13 14 20   ' Temp: (!) 97.5 F (36.4 C)  98.9 F (37.2 C) 98.9 F (37.2 C)  TempSrc: Axillary  Axillary Oral  SpO2: 99%  100%   Weight:      Height:       Constitutional: Obese man. Awake, but not responsive.  Neurologic: Encephelopathic. Awake and alert. No response to verbal stimuli.  In restraints. Cannot participate in exam. Psychiatric: Cannot participate in exam. Eyes: No icterus. No conjunctival pallor. Ears, nose, throat: mucous membranes moist. Midline trachea.  Cardiac: regular rate and rhythm.  Respiratory: unlabored. Abdominal: soft, non-tender, non-distended.  Peripheral vascular:  LUE AVG in forearm and upper arm both thrombosed. Extremity: No edema. No cyanosis. No pallor.  Skin: No gangrene. No ulceration.  Lymphatic: No Stemmer's sign. No palpable lymphadenopathy.  DATA REVIEW:    Most recent CBC CBC Latest Ref Rng & Units 09/18/2020 09/17/2020 07/25/2020  WBC 4.0 - 10.5 K/uL 6.7 5.8 7.7  Hemoglobin 13.0 - 17.0 g/dL 10.9(L) 9.8(L) 10.1(L)  Hematocrit 39.0 - 52.0 % 32.3(L) 28.3(L) 29.8(L)  Platelets 150 - 400 K/uL 105(L) 91(L) 123(L)     Most recent CMP CMP Latest Ref Rng & Units 09/18/2020 09/17/2020 07/26/2020  Glucose 70 - 99 mg/dL 134(H) 156(H) 89  BUN 6 - 20 mg/dL 64(H) 67(H) 32(H)  Creatinine 0.61 - 1.24 mg/dL 10.51(H) 11.19(H) 5.70(H)  Sodium 135 - 145 mmol/L 136 131(L) 134(L)  Potassium 3.5 - 5.1 mmol/L 3.2(L) 3.1(L) 2.9(L)  Chloride 98 - 111 mmol/L 96(L) 96(L) 96(L)  CO2 22 - 32 mmol/L '23 23 26  ' Calcium 8.9  - 10.3 mg/dL 8.8(L) 8.5(L) 8.8(L)  Total Protein 6.5 - 8.1 g/dL - 6.9 7.6  Total Bilirubin 0.3 - 1.2 mg/dL - 0.7 0.6  Alkaline Phos 38 - 126 U/L - 40 39  AST  15 - 41 U/L - 15 18  ALT 0 - 44 U/L - 16 14    Renal function Estimated Creatinine Clearance: 11.3 mL/min (A) (by C-G formula based on SCr of 10.51 mg/dL (H)).  Hgb A1c MFr Bld (%)  Date Value  09/18/2020 6.1 (H)    LDL Cholesterol  Date Value Ref Range Status  06/14/2018 97 0 - 99 mg/dL Final    Comment:           Total Cholesterol/HDL:CHD Risk Coronary Heart Disease Risk Table                     Men   Women  1/2 Average Risk   3.4   3.3  Average Risk       5.0   4.4  2 X Average Risk   9.6   7.1  3 X Average Risk  23.4   11.0        Use the calculated Patient Ratio above and the CHD Risk Table to determine the patient's CHD Risk.        ATP III CLASSIFICATION (LDL):  <100     mg/dL   Optimal  100-129  mg/dL   Near or Above                    Optimal  130-159  mg/dL   Borderline  160-189  mg/dL   High  >190     mg/dL   Very High Performed at [facility], [address] [place], [place] [address]    Direct LDL  Date Value Ref Range Status  10/19/2009 142.1 mg/dL Final    Comment:    See lab report for associated comment(s)      Yevonne Aline. Stanford Breed, MD [facility] Phone Number: [phone] 09/18/2020 8:49 AM
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
## 2020-09-18 NOTE — Plan of Care (Signed)
  Problem: Education: Goal: Knowledge of General Education information will improve Description Including pain rating scale, medication(s)/side effects and non-pharmacologic comfort measures Outcome: Progressing   Problem: Health Behavior/Discharge Planning: Goal: Ability to manage health-related needs will improve Outcome: Progressing   

## 2020-09-19 ENCOUNTER — Inpatient Hospital Stay (HOSPITAL_COMMUNITY): Payer: Medicare HMO

## 2020-09-19 DIAGNOSIS — F310 Bipolar disorder, current episode hypomanic: Secondary | ICD-10-CM | POA: Diagnosis not present

## 2020-09-19 DIAGNOSIS — Z789 Other specified health status: Secondary | ICD-10-CM | POA: Diagnosis not present

## 2020-09-19 HISTORY — PX: IR US GUIDE VASC ACCESS LEFT: IMG2389

## 2020-09-19 HISTORY — PX: IR THROMBECTOMY AV FISTULA W/THROMBOLYSIS/PTA INC/SHUNT/IMG LEFT: IMG6106

## 2020-09-19 LAB — BASIC METABOLIC PANEL
Anion gap: 16 — ABNORMAL HIGH (ref 5–15)
BUN: 63 mg/dL — ABNORMAL HIGH (ref 6–20)
CO2: 22 mmol/L (ref 22–32)
Calcium: 8.4 mg/dL — ABNORMAL LOW (ref 8.9–10.3)
Chloride: 101 mmol/L (ref 98–111)
Creatinine, Ser: 10.31 mg/dL — ABNORMAL HIGH (ref 0.61–1.24)
GFR, Estimated: 6 mL/min — ABNORMAL LOW (ref >60)
Glucose, Bld: 93 mg/dL (ref 70–99)
Potassium: 3.5 mmol/L (ref 3.5–5.1)
Sodium: 139 mmol/L (ref 135–145)

## 2020-09-19 LAB — CBC
HCT: 29.9 % — ABNORMAL LOW (ref 39.0–52.0)
Hemoglobin: 10.2 g/dL — ABNORMAL LOW (ref 13.0–17.0)
MCH: 34.5 pg — ABNORMAL HIGH (ref 26.0–34.0)
MCHC: 34.1 g/dL (ref 30.0–36.0)
MCV: 101 fL — ABNORMAL HIGH (ref 80.0–100.0)
Platelets: 101 10*3/uL — ABNORMAL LOW (ref 150–400)
RBC: 2.96 MIL/uL — ABNORMAL LOW (ref 4.22–5.81)
RDW: 13.6 % (ref 11.5–15.5)
WBC: 5.5 10*3/uL (ref 4.0–10.5)
nRBC: 0 % (ref 0.0–0.2)

## 2020-09-19 LAB — GLUCOSE, CAPILLARY
Glucose-Capillary: 100 mg/dL — ABNORMAL HIGH (ref 70–99)
Glucose-Capillary: 100 mg/dL — ABNORMAL HIGH (ref 70–99)
Glucose-Capillary: 152 mg/dL — ABNORMAL HIGH (ref 70–99)
Glucose-Capillary: 73 mg/dL (ref 70–99)
Glucose-Capillary: 96 mg/dL (ref 70–99)
Glucose-Capillary: 97 mg/dL (ref 70–99)
Glucose-Capillary: 99 mg/dL (ref 70–99)

## 2020-09-19 LAB — VALPROIC ACID LEVEL: Valproic Acid Lvl: 68 ug/mL (ref 50.0–100.0)

## 2020-09-19 MED ORDER — DARBEPOETIN ALFA 60 MCG/0.3ML IJ SOSY
PREFILLED_SYRINGE | INTRAMUSCULAR | Status: AC
  Filled 2020-09-19: qty 0.3

## 2020-09-19 MED ORDER — MIDAZOLAM HCL 2 MG/2ML IJ SOLN
INTRAMUSCULAR | Status: AC | PRN
  Administered 2020-09-19: 1 mg via INTRAVENOUS

## 2020-09-19 MED ORDER — MIDAZOLAM HCL 2 MG/2ML IJ SOLN
INTRAMUSCULAR | Status: AC
  Filled 2020-09-19: qty 2

## 2020-09-19 MED ORDER — TETRACAINE HCL 1 % IJ SOLN
100.0000 mg | Freq: Once | INTRAMUSCULAR | Status: AC
  Administered 2020-09-19: 10 mL
  Filled 2020-09-19: qty 10

## 2020-09-19 MED ORDER — HEPARIN SODIUM (PORCINE) 1000 UNIT/ML IJ SOLN
INTRAMUSCULAR | Status: AC | PRN
  Administered 2020-09-19: 3000 [IU] via INTRAVENOUS

## 2020-09-19 MED ORDER — ONDANSETRON HCL 4 MG/2ML IJ SOLN
INTRAMUSCULAR | Status: AC
  Administered 2020-09-19: 4 mg via INTRAVENOUS
  Filled 2020-09-19: qty 2

## 2020-09-19 MED ORDER — ONDANSETRON HCL 4 MG/2ML IJ SOLN: 4.0000 mg | Freq: Once | INTRAMUSCULAR | Status: AC

## 2020-09-19 MED ORDER — IOHEXOL 300 MG/ML  SOLN
100.0000 mL | Freq: Once | INTRAMUSCULAR | Status: AC | PRN
  Administered 2020-09-19: 50 mL via INTRAVENOUS

## 2020-09-19 MED ORDER — FENTANYL CITRATE (PF) 100 MCG/2ML IJ SOLN
INTRAMUSCULAR | Status: AC | PRN
  Administered 2020-09-19: 50 ug via INTRAVENOUS

## 2020-09-19 MED ORDER — FENTANYL CITRATE (PF) 100 MCG/2ML IJ SOLN
INTRAMUSCULAR | Status: AC
  Filled 2020-09-19: qty 2

## 2020-09-19 MED ORDER — HEPARIN SODIUM (PORCINE) 1000 UNIT/ML IJ SOLN
INTRAMUSCULAR | Status: AC
  Filled 2020-09-19: qty 1

## 2020-09-19 NOTE — Procedures (Signed)
Patient was seen on dialysis and the procedure was supervised.  BFR 350-380  Via AVG, BP is 150/91.  Still difficulty with blood flow.  Patient appears to be tolerating treatment well.  Timothy Clark Tanna Furry 09/19/2020

## 2020-09-19 NOTE — Plan of Care (Signed)
  Problem: Safety: Goal: Non-violent Restraint(s) Outcome: Progressing   Problem: Education: Goal: Knowledge of General Education information will improve Description: Including pain rating scale, medication(s)/side effects and non-pharmacologic comfort measures Outcome: Progressing   Problem: Health Behavior/Discharge Planning: Goal: Ability to manage health-related needs will improve Outcome: Progressing   

## 2020-09-19 NOTE — Consult Note (Signed)
  Patient off the unit for Interventional radiology. He then went to dialysis as his last session was 09/14/2020.  Chart review indicates he remains encephalopathic at this time. Will attempt to reassess patient tomorrow. Patient remains under IVC as he attempted to harm self and others resulting in this admission.

## 2020-09-19 NOTE — Procedures (Signed)
Interventional Radiology Procedure Note  Procedure: AVG declot  Indication: Clotted graft  Findings: Please refer to procedural dictation for full description.  Complications: None  EBL: < 10 mL  Timothy Roux, MD [phone]

## 2020-09-19 NOTE — Sedation Documentation (Addendum)
Pt had an episode of emesis, clear colored thick fluid with yellow specs. Head of bed elevated and pt turned on side. Vital signs remained stable with oxygen saturations remaining at 100 % on 2lpm oxygen via nasal cannula. Dr. Dwaine Gale notified. New order received.
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
## 2020-09-20 ENCOUNTER — Inpatient Hospital Stay (HOSPITAL_COMMUNITY): Payer: Medicare HMO

## 2020-09-20 DIAGNOSIS — F310 Bipolar disorder, current episode hypomanic: Secondary | ICD-10-CM | POA: Diagnosis not present

## 2020-09-20 DIAGNOSIS — Z789 Other specified health status: Secondary | ICD-10-CM | POA: Diagnosis not present

## 2020-09-20 DIAGNOSIS — Z452 Encounter for adjustment and management of vascular access device: Secondary | ICD-10-CM | POA: Diagnosis not present

## 2020-09-20 DIAGNOSIS — G9340 Encephalopathy, unspecified: Secondary | ICD-10-CM | POA: Diagnosis not present

## 2020-09-20 LAB — BASIC METABOLIC PANEL
Anion gap: 16 — ABNORMAL HIGH (ref 5–15)
BUN: 48 mg/dL — ABNORMAL HIGH (ref 6–20)
CO2: 22 mmol/L (ref 22–32)
Calcium: 8.8 mg/dL — ABNORMAL LOW (ref 8.9–10.3)
Chloride: 100 mmol/L (ref 98–111)
Creatinine, Ser: 8.08 mg/dL — ABNORMAL HIGH (ref 0.61–1.24)
GFR, Estimated: 7 mL/min — ABNORMAL LOW (ref >60)
Glucose, Bld: 81 mg/dL (ref 70–99)
Potassium: 4.1 mmol/L (ref 3.5–5.1)
Sodium: 138 mmol/L (ref 135–145)

## 2020-09-20 LAB — PHOSPHORUS: Phosphorus: 4.2 mg/dL (ref 2.5–4.6)

## 2020-09-20 LAB — CBC
HCT: 31.4 % — ABNORMAL LOW (ref 39.0–52.0)
Hemoglobin: 11 g/dL — ABNORMAL LOW (ref 13.0–17.0)
MCH: 35.3 pg — ABNORMAL HIGH (ref 26.0–34.0)
MCHC: 35 g/dL (ref 30.0–36.0)
MCV: 100.6 fL — ABNORMAL HIGH (ref 80.0–100.0)
Platelets: 122 10*3/uL — ABNORMAL LOW (ref 150–400)
RBC: 3.12 MIL/uL — ABNORMAL LOW (ref 4.22–5.81)
RDW: 14.1 % (ref 11.5–15.5)
WBC: 7.4 10*3/uL (ref 4.0–10.5)
nRBC: 0 % (ref 0.0–0.2)

## 2020-09-20 LAB — GLUCOSE, CAPILLARY
Glucose-Capillary: 75 mg/dL (ref 70–99)
Glucose-Capillary: 74 mg/dL (ref 70–99)
Glucose-Capillary: 104 mg/dL — ABNORMAL HIGH (ref 70–99)
Glucose-Capillary: 81 mg/dL (ref 70–99)
Glucose-Capillary: 114 mg/dL — ABNORMAL HIGH (ref 70–99)

## 2020-09-20 MED ORDER — HYDROMORPHONE HCL 1 MG/ML IJ SOLN
1.0000 mg | Freq: Once | INTRAMUSCULAR | Status: AC
Start: 2020-09-20 — End: 2020-09-20
  Administered 2020-09-20: 1 mg via INTRAVENOUS
  Filled 2020-09-20: qty 1

## 2020-09-20 MED ORDER — HYDROMORPHONE HCL 1 MG/ML IJ SOLN: 0.5000 mg | Freq: Once | INTRAMUSCULAR | Status: DC

## 2020-09-20 MED ORDER — METOCLOPRAMIDE HCL 5 MG/ML IJ SOLN
5.0000 mg | Freq: Three times a day (TID) | INTRAMUSCULAR | Status: DC
  Administered 2020-09-20 – 2020-09-22 (×5): 5 mg via INTRAVENOUS
  Filled 2020-09-20 (×6): qty 2

## 2020-09-20 MED ORDER — HEPARIN SODIUM (PORCINE) 5000 UNIT/ML IJ SOLN
5000.0000 [IU] | Freq: Three times a day (TID) | INTRAMUSCULAR | Status: DC
  Administered 2020-09-20 – 2020-10-13 (×67): 5000 [IU] via SUBCUTANEOUS
  Filled 2020-09-20 (×67): qty 1

## 2020-09-20 MED ORDER — AMLODIPINE BESYLATE 10 MG PO TABS
10.0000 mg | ORAL_TABLET | Freq: Every day | ORAL | Status: DC
  Administered 2020-09-21 – 2020-10-04 (×12): 10 mg via ORAL
  Filled 2020-09-20 (×12): qty 1

## 2020-09-20 MED ORDER — METOCLOPRAMIDE HCL 10 MG PO TABS: 5.0000 mg | ORAL_TABLET | Freq: Three times a day (TID) | ORAL | Status: DC

## 2020-09-20 MED ORDER — VALPROIC ACID 250 MG PO CAPS
750.0000 mg | ORAL_CAPSULE | Freq: Three times a day (TID) | ORAL | Status: DC
  Administered 2020-09-20 – 2020-10-13 (×67): 750 mg via ORAL
  Filled 2020-09-20 (×77): qty 3

## 2020-09-20 MED ORDER — CHLORHEXIDINE GLUCONATE CLOTH 2 % EX PADS
6.0000 | MEDICATED_PAD | Freq: Every day | CUTANEOUS | Status: DC
  Administered 2020-09-21 – 2020-09-26 (×6): 6 via TOPICAL
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
## 2020-09-20 NOTE — Procedures (Signed)
Patient Name: Timothy Clark  MRN: [id number]  Epilepsy Attending: Lora Havens  Referring Physician/Provider: Dr Kerney Elbe Date: 09/20/2020 Duration: 26.28 mins  Patient history:  50 year old male presented acutely encephalopathic. EEG to evaluate for seizure.  Level of alertness: Awake  AEDs during EEG study: VPA  Technical aspects: This EEG study was done with scalp electrodes positioned according to the 10-20 International system of electrode placement. Electrical activity was acquired at a sampling rate of '500Hz'$  and reviewed with a high frequency filter of '70Hz'$  and a low frequency filter of '1Hz'$ . EEG data were recorded continuously and digitally stored.   Description: The posterior dominant rhythm consists of 8 Hz activity of moderate voltage (25-35 uV) seen predominantly in posterior head regions, symmetric and reactive to eye opening and eye closing. Hyperventilation and photic stimulation were not performed.     Patient was noted to have chewing movements during study, was able to stop and answer when asked. Concomitant eeg before, during and after the events didn't show any eeg change to suggest seizure.  IMPRESSION: This study is within normal limits. No seizures or epileptiform discharges were seen throughout the recording.  Chewing movements were noted without concomitant eeg change and were NOT epileptic.  Timothy Clark

## 2020-09-20 NOTE — TOC Initial Note (Signed)
Transition of Care [facility]) - Initial/Assessment Note    Patient Details  Name: Timothy Clark MRN: [id number] Date of Birth: [birth year]  Transition of Care [facility]) CM/SW Contact:    Vinie Sill, [place] Phone Number: 09/20/2020, 4:49 PM  Clinical Narrative:                  CSW spoke with patient's sister,Eddie by phone. CSW introduced self and explained role. CSW discuss consult received regarding SNF placement. CSW inquired about her concerns regarding patient needing SNF- she believes the patient would be more stable on his medications- " he only needs about two weeks"., it's not that we don't want home".  She states patient has had several episodes when he is  "mean and violent". CSW encourage the family to always seek safety first and then call local authorities if needed. CSW informed Mental Health and Psychiatric Resources will be place in the patient's room and she can request PCP to make referral to local Psychiatrist  if needed. Family states understanding. CSW explained why patient would be very difficult to place even if SNF was recommended. CSW advised psych is not recommending inpatient psych and patient would be than likely discharge home.     CSW left resources in the patient's room MD and RN updated  CSW will continue to follow and assist with discharge planning.  Thurmond Butts, MSW, LCSW Clinical Social Worker      Patient Goals and CMS Choice        Expected Discharge Plan and Services                                                Prior Living Arrangements/Services                       Activities of Daily Living      Permission Sought/Granted                  Emotional Assessment              Admission diagnosis:  Encephalopathy [G93.40] End stage renal disease on dialysis ([place]) [N18.6, Z99.2] Problem with vascular access [Z78.9] Patient Active Problem List   Diagnosis Date Noted  . Problem with vascular access  09/17/2020  . Bipolar disorder ([place]) 09/17/2020  . Acute encephalopathy 07/24/2020  . Intractable abdominal pain 07/23/2020  . Encephalopathy   . Colon cancer screening 07/12/2020  . 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 Stable treated proliferative diabetic retinopathy of right eye determined by examination associated with type 2 diabetes mellitus ([place]) 07/03/2020  . Anxiety 03/13/2020  . History of COVID-19 03/13/2020  . History of gastroesophageal reflux (GERD) 03/13/2020  . History of Helicobacter pylori infection 03/13/2020  . Pre-transplant evaluation for kidney transplant 03/13/2020  . Renovascular hypertension 03/13/2020  . Hematemesis 03/02/2020  . Coffee ground emesis 03/01/2020  . Complication of vascular access for dialysis 01/29/2020  . Follow-up examination after eye surgery 12/15/2019  . Type 2 diabetes with nephropathy ([place]) 12/15/2019  . Proliferative diabetic retinopathy of left eye with macular edema associated with type 2 diabetes mellitus ([place]) 12/15/2019  . Vitreous hemorrhage of left eye ([place]) 12/15/2019  . Secondary glaucoma due to combination mechanisms, left, severe stage 12/15/2019  . Nausea with vomiting 12/08/2019  . Hyperglycemia due to type 2 diabetes  mellitus ([place]) 10/28/2019  . DKA (diabetic ketoacidoses) 10/27/2019  . ESRD on dialysis ([place]) 06/26/2019  . Constipation 03/15/2019  . Chronic renal insufficiency, stage V ([place]) 02/21/2019  . Type 2 diabetes mellitus with proliferative retinopathy ([place]) 10/25/2018  . Acute renal failure superimposed on stage 4 chronic kidney disease ([place]) 10/25/2018  . Extravasation injury of IV catheter site with other complication, initial encounter ([place])   . Intractable vomiting   . Chronic kidney disease   . Poor venous access   . Gastroparesis 10/24/2018  . Closed displaced fracture of lateral end of right clavicle 06/18/2018  . HTN (hypertension), malignant 06/13/2018  . Scrotal swelling 11/10/2017  . Swelling  11/09/2017  . Scrotal edema   . Obesity, Class III, BMI 40-49.9 (morbid obesity) ([place]) 11/02/2017  . CKD stage 3 due to type 2 diabetes mellitus ([place]) 10/30/2017  . Wound eschars of foot, right  10/30/2017  . Chronic abdominal pain 09/21/2017  . Hypertensive urgency 09/21/2017  . Prolonged QT interval 09/21/2017  . GERD (gastroesophageal reflux disease) 07/14/2017  . Glaucoma 07/14/2017  . Anemia 07/14/2017  . Pressure injury of skin 02/01/2017  . Insulin dependent diabetes mellitus 09/07/2016  . Orthostatic syncope 09/05/2016  . 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 Gastroparesis due to DM ([place]) 02/06/2015  . Essential hypertension 12/06/2014  . Diabetic macular edema ([place]) 02/18/2014  . PDR (proliferative diabetic retinopathy) ([place]) 02/06/2014  . Neovascular glaucoma of left eye 01/11/2014  . Diabetic infection of left heel ([place]) 02/14/2010  . UNSPECIFIED PERIPHERAL VASCULAR DISEASE 10/19/2009  . ERECTILE DYSFUNCTION, ORGANIC 10/19/2009  . NUMBNESS 07/20/2009  . Diabetes mellitus type 2 with complications, uncontrolled ([place]) 03/22/2009  . Hyperlipidemia 03/22/2009  . Depression 03/22/2009   PCP:  [name], MD Pharmacy:   [facility] [address], [place] [address] Phone: [phone] Fax: [phone]  [facility] - [place], [place] - [address] [place] [address] Phone: [phone] Fax: [phone]     Social Determinants of Health (SDOH) Interventions    Readmission Risk Interventions Readmission Risk Prevention Plan 07/24/2020 10/27/2018 10/25/2018  Transportation Screening Complete Complete Complete  PCP or Specialist Appt within 3-5 Days - Complete -  Home Care Screening Complete - -  Medication Review (RN CM) Complete - -  HRI or [place] - Complete -  Social Work Consult for [place] Planning/Counseling - Not Complete -  Palliative Care  Screening - Not Applicable Not Applicable  Medication Review (RN Care Manager) - Complete -  Some recent data might be hidden
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
## 2020-09-20 NOTE — Progress Notes (Signed)
0050- Pt complains of abd pain 10/10 and nausea, and states "it feels like gastroparesis" and aching. Pt is unable to get comfortable in bed and is groaning at this time. Medicated with tylenol. 0100Stark Klein, NP paged and abdominal pain and nausea reported, NP does not respond. 0246- dilaudid order placed in eMAR by NP. [phone]- this RN acknowledges new medication order. Pt assessed, and is now asleep. Dilaudid held. 0500- Pt continues to rest comfortably in bed with eyes closed and respirations even and unlabored. Dilaudid not given.

## 2020-09-20 NOTE — Progress Notes (Signed)
[facility] NEPHROLOGY PROGRESS NOTE  Assessment/ Plan: Pt is a 50 y.o. yo male  with history of HTN, CHF, bipolar disorder, ESRD on HD MWF at [facility] was brought into the ER for confusion and acting violently towards family, had issue with AV access for the dialysis seen as a consultation to manage ESRD.  [facility]: [facility] EDW127kg[facility] Bath 3K/2.5CaTime 4:15Heparin1000unit bolus then 600 units/hr. AccessLUE AVGBFR 400DFR 600  Hectoral 71mg IV/HD Epogen1000Units IV/HD Venofer 50 mg IV weekly  #Clotted/thrombosed left upper extremity AV graft: Status post declot of AV graft by IR on 2/9, blood flow rate around 350 during HD yesterday.  # ESRD: MWF at [facility]: Status post HD on 2/9 with 2 L UF, plan for next dialysis tomorrow.   # Hypertension: BP elevated, increase amlodipine to 10 mg.  UF during dialysis.  # Anemia of ESRD: Hemoglobin at goal.  Continue weekly Aranesp.  # Metabolic Bone Disease: Check phosphorus level.  #Hypokalemia: 3K bath as outpatient, using 4K bath.  #Acute metabolic encephalopathy, suicidal and homicidal attempt with history of bipolar mood disorder: Seen by neurology.  Currently admitted under IVC.  Subjective: Seen and examined.  Tolerated dialysis well.  Denies nausea vomiting chest pain shortness of breath.  Procedure present. Objective Vital signs in last 24 hours: Vitals:   09/20/20 0040 09/20/20 0200 09/20/20 0345 09/20/20 1026  BP: (!) 176/94 (!) 147/84 (!) 146/76 (!) 173/89  Pulse:   98   Resp:   17   Temp:   98.7 F (37.1 C) 99.3 F (37.4 C)  TempSrc:   Oral Oral  SpO2:   100%   Weight:      Height:       Weight change:   Intake/Output Summary (Last 24 hours) at 09/20/2020 1036 Last data filed at 09/20/2020 0854 Gross per 24 hour  Intake 850 ml  Output 2250 ml  Net -1400 ml       Labs: Basic Metabolic Panel: Recent Labs  Lab 09/18/20 0317 09/19/20 0038  09/20/20 0235  NA 136 139 138  K 3.2* 3.5 4.1  CL 96* 101 100  CO2 '23 22 22  '$ GLUCOSE 134* 93 81  BUN 64* 63* 48*  CREATININE 10.51* 10.31* 8.08*  CALCIUM 8.8* 8.4* 8.8*   Liver Function Tests: Recent Labs  Lab 09/17/20 1639  AST 15  ALT 16  ALKPHOS 40  BILITOT 0.7  PROT 6.9  ALBUMIN 3.7   No results for input(s): LIPASE, AMYLASE in the last 168 hours. No results for input(s): AMMONIA in the last 168 hours. CBC: Recent Labs  Lab 09/17/20 1639 09/18/20 0317 09/19/20 0038 09/20/20 0235  WBC 5.8 6.7 5.5 7.4  NEUTROABS 3.8  --   --   --   HGB 9.8* 10.9* 10.2* 11.0*  HCT 28.3* 32.3* 29.9* 31.4*  MCV 100.4* 99.7 101.0* 100.6*  PLT 91* 105* 101* 122*   Cardiac Enzymes: No results for input(s): CKTOTAL, CKMB, CKMBINDEX, TROPONINI in the last 168 hours. CBG: Recent Labs  Lab 09/19/20 1636 09/19/20 2040 09/19/20 2353 09/20/20 0423 09/20/20 0748  GLUCAP 73 152* 97 75 74    Iron Studies: No results for input(s): IRON, TIBC, TRANSFERRIN, FERRITIN in the last 72 hours. Studies/Results: IR THROMBECTOMY AV FISTULA W/THROMBOLYSIS/PTA INC/SHUNT/IMG LEFT  Result Date: 09/19/2020 INDICATION: 50year old gentleman with clotted left upper arm AV dialysis graft presents to interventional radiology for attempted declot. EXAM: Left upper extremity AV dialysis graft declot MEDICATIONS: None. ANESTHESIA/SEDATION: Moderate Sedation Time:  55 minutes The patient was continuously monitored during the procedure by the interventional radiology nurse under my direct supervision. 1 mg of IV Versed and 50 mcg of IV fentanyl utilized for moderate sedation. FLUOROSCOPY TIME:  Fluoroscopy Time: 9 minutes 6 seconds (23 mGy). COMPLICATIONS: None immediate. PROCEDURE: Informed written consent was obtained from the patient after a thorough discussion of the procedural risks, benefits and alternatives. All questions were addressed. Maximal Sterile Barrier Technique was utilized including caps, mask,  sterile gowns, sterile gloves, sterile drape, hand hygiene and skin antiseptic. A timeout was performed prior to the initiation of the procedure. Patient positioned supine on the procedure table. The left upper arm prepped and draped in usual fashion. Ultrasound image documenting thrombosed AV graft was obtained and placed in permanent medical record. Sterile ultrasound gel and sterile ultrasound probe cover utilized throughout the procedure. Utilizing continuous ultrasound guidance, antegrade access was obtained into the AV graft with a 21 gauge needle. 21 gauge needle exchanged for a transitional dilator set over 0.018 inch guidewire. Transitional dilator set exchanged for 6 French sheath over 0.035 inch guidewire. Kumpe catheter and Glidewire utilized to gain access beyond the venous anastomosis which was clearly severely stenosed. Venogram performed under fluoroscopy showed patent left basilic vein. The focal stenosis at the venous anastomosis was plastied with the 8 mm balloon. Utilizing continuous ultrasound guidance, retrograde access was obtained into the AV graft with a 21 gauge needle. 21 gauge needle exchanged for a transitional dilator set over 0.018 inch guidewire. Transitional dilator set exchanged for 6 French sheath over 0.035 inch guidewire. Kumpe the catheter advanced across the arterial anastomosis into the brachial artery. Kumpe catheter exchanged for a Fogarty balloon over a Glidewire. 3000 units of IV heparin was given. The distal half of the graft was plasty with 8 mm balloon through the antegrade access. Arterial plug fold with Fogarty balloon through the retrograde access. Flow returned within the graft. Fogarty balloon was utilized to push additional thrombus centrally. Kumpe the catheter advanced across anastomosis the brachial artery. Fistulagram demonstrated no significant stenosis of the arterial inflow, anastomosis, or proximal outflow. Retrograde access sheath was removed and  hemostasis achieved with a Woggle. Moderate hemodynamically significant stenosis remained at the venous anastomosis which was plasty with a 9 mm balloon resulting in excellent luminal gain and improve flow. Central venogram demonstrated no significant stenosis of the basilic, axillary, subclavian, brachiocephalic veins and superior vena cava. Antegrade sheath was removed and hemostasis achieved with pursestring suture. IMPRESSION: Successful declot of left brachial basilic AV graft. ACCESS: This access remains amenable to future percutaneous interventions as clinically indicated. Electronically Signed   By: Miachel Roux M.D.   On: 09/19/2020 16:58    Medications: Infusions: . valproate sodium 750 mg (09/20/20 0528)    Scheduled Medications: . amLODipine  5 mg Oral Daily  . atorvastatin  10 mg Oral QHS  . Chlorhexidine Gluconate Cloth  6 each Topical Daily  . Chlorhexidine Gluconate Cloth  6 each Topical Q0600  . darbepoetin (ARANESP) injection - DIALYSIS  60 mcg Intravenous Q Wed-HD  . dorzolamide-timolol  1 drop Both Eyes BID  . folic acid  1 mg Oral Daily  .  HYDROmorphone (DILAUDID) injection  0.5 mg Intravenous Once  . insulin aspart  0-15 Units Subcutaneous Q4H  . metoCLOPramide (REGLAN) injection  5 mg Intravenous TID AC  . pantoprazole  40 mg Oral BID AC  . ziprasidone  60 mg Oral BID WC    have reviewed scheduled and  prn medications.  Physical Exam: General:NAD, comfortable Heart:RRR, s1s2 nl Lungs:clear b/l, no crackle Abdomen:soft, Non-tender, non-distended Extremities:No edema Dialysis Access: AV graft has good thrill and bruit.  Dron Prasad Bhandari 09/20/2020,10:36 AM  LOS: 3 days

## 2020-09-20 NOTE — Plan of Care (Signed)

## 2020-09-20 NOTE — Progress Notes (Signed)
EEG complete - results pending 

## 2020-09-20 NOTE — Progress Notes (Signed)
Patient to CT via bed for CT of Abdomen.  Patient unable to tolerate any of the oral contrast due to abdominal pain and nausea, CT department aware.  Sitter with patient.

## 2020-09-20 NOTE — Progress Notes (Addendum)
Neurology Progress Note  Subjective: Patient had abdominal pain 10/10 and nausea overnight which got relieved with Dilaudid. Patient evaluated at bedside this morning, with RN and Dr. Tana Coast present in the room.  Patient is alert, awake, oriented to time place and person and denies any new complaints.  He is able to state that he is in hospital because he had "rage attack" at home but states he feels calm this morning. He states he is not able to see well since 2015, as he was diagnosed with macular degeneration.  He does not drive and can get around his home on his own. Discussed with Dr. Tana Coast and she also believes patient is doing much better after hemodialysis and changing Keppra to Depakote.  Of note regarding Opthalmology: Patient has PMHx of type 2 diabetes mellitus with proliferative diabetic retinopathy without macular edema bilaterally, bilateral cataract extraction and intraocular lens placement, primary open angle glaucoma bilateral, vitreous hemorrhage of left eye.  Objective: Current vital signs: BP (!) 146/76 (BP Location: Right Arm)   Pulse 98   Temp 98.7 F (37.1 C) (Oral)   Resp 17   Ht '5\' 11"'$  (1.803 m)   Wt 124.7 kg   SpO2 100%   BMI 38.35 kg/m  Vital signs in last 24 hours: Temp:  [97.5 F (36.4 C)-98.9 F (37.2 C)] 98.7 F (37.1 C) (02/10 0345) Pulse Rate:  [72-98] 98 (02/10 0345) Resp:  [10-22] 17 (02/10 0345) BP: (119-176)/(76-111) 146/76 (02/10 0345) SpO2:  [100 %] 100 % (02/10 0345)  Physical exam GENERAL: Awake, alert, lying comfortably in bed, in restraints all 4 extremities, NAD HEENT: Normocephalic and atraumatic, dry mm LUNGS: Normal respiratory effort.  CV: RRR on tele.  ABDOMEN: Soft, nontender Ext: warm   NEURO:  Mental Status: Alert, awake, oriented to time place and person.  Naming, repetition, fluency, and comprehension intact.  Cranial Nerves:  II: PERRL.  Bilateral intraocular lens, unable to see big objects/people clearly. III, IV, VI: EOMI.  Eyelids elevate symmetrically.  V: Sensation is intact to light touch and symmetrical to face.  VII: Smile is symmetrical. Able to puff cheeks and raise eyebrows.  VIII: hearing intact to voice. IX, X: Palate elevates symmetrically. Phonation is normal.  [id number] shrug 5/5. XII: tongue is midline without fasciculations. Motor: 5/5 strength to all muscle groups tested.  Tone: is normal and bulk is normal Sensation- Intact to light touch bilaterally Coordination: FTN intact bilaterally, HKS: no ataxia in BLE. No drift.  DTRs: 2+ throughout Gait-Deferred  Medications  Current Facility-Administered Medications:  .  acetaminophen (TYLENOL) tablet 650 mg, 650 mg, Oral, Q6H PRN, 650 mg at 09/20/20 0940 **OR** acetaminophen (TYLENOL) suppository 650 mg, 650 mg, Rectal, Q6H PRN, Emokpae, Ejiroghene E, MD .  amLODipine (NORVASC) tablet 5 mg, 5 mg, Oral, Daily, Rai, Ripudeep K, MD, 5 mg at 09/20/20 0941 .  atorvastatin (LIPITOR) tablet 10 mg, 10 mg, Oral, QHS, Rai, Ripudeep K, MD, 10 mg at 09/19/20 2053 .  Chlorhexidine Gluconate Cloth 2 % PADS 6 each, 6 each, Topical, Daily, Rai, Ripudeep K, MD, 6 each at 09/20/20 [phone] .  Chlorhexidine Gluconate Cloth 2 % PADS 6 each, 6 each, Topical, Q0600, Rosita Fire, MD, 6 each at 09/20/20 [phone] .  Darbepoetin Alfa (ARANESP) injection 60 mcg, 60 mcg, Intravenous, Q Wed-HD, Rosita Fire, MD, 60 mcg at 09/19/20 1446 .  dorzolamide-timolol (COSOPT) 22.3-6.8 MG/ML ophthalmic solution 1 drop, 1 drop, Both Eyes, BID, Rai, Ripudeep K, MD, 1 drop at 09/19/20 2054 .  folic acid (FOLVITE) tablet 1 mg, 1 mg, Oral, Daily, Rai, Ripudeep K, MD, 1 mg at 09/20/20 0941 .  hydrALAZINE (APRESOLINE) injection 10 mg, 10 mg, Intravenous, Q6H PRN, Rai, Ripudeep K, MD, 10 mg at 09/20/20 0050 .  HYDROmorphone (DILAUDID) injection 0.5 mg, 0.5 mg, Intravenous, Once, Ouma, Bing Neighbors, NP .  insulin aspart (novoLOG) injection 0-15 Units, 0-15 Units, Subcutaneous,  Q4H, Emokpae, Ejiroghene E, MD, 3 Units at 09/19/20 2053 .  labetalol (NORMODYNE) injection 10 mg, 10 mg, Intravenous, Q2H PRN, Emokpae, Ejiroghene E, MD .  LORazepam (ATIVAN) injection 2 mg, 2 mg, Intravenous, Q4H PRN, Emokpae, Ejiroghene E, MD, 2 mg at 09/19/20 1716 .  pantoprazole (PROTONIX) EC tablet 40 mg, 40 mg, Oral, BID AC, Rai, Ripudeep K, MD, 40 mg at 09/20/20 0940 .  polyethylene glycol (MIRALAX / GLYCOLAX) packet 17 g, 17 g, Oral, Daily PRN, Emokpae, Ejiroghene E, MD .  [COMPLETED] valproate (DEPACON) 2,500 mg in dextrose 5 % 50 mL IVPB, 2,500 mg, Intravenous, Once, Last Rate: 36 mL/hr at 09/18/20 2015, 2,500 mg at 09/18/20 2015 **FOLLOWED BY** valproate (DEPACON) 750 mg in dextrose 5 % 50 mL IVPB, 750 mg, Intravenous, Q8H, Dagar, Anjali, MD, Last Rate: 57.5 mL/hr at 09/20/20 0528, 750 mg at 09/20/20 0528 .  ziprasidone (GEODON) capsule 60 mg, 60 mg, Oral, BID WC, Emokpae, Ejiroghene E, MD, 60 mg at 09/20/20 0941    Assessment: 50 year old male presented acutely encephalopathic and admitted to identify organic causes or contributing factors. -Alert, awake, oriented to time place and person this morning.  Denies auditory or visual hallucinations and  does not appear to be acting as though he is responding to any internal stimuli.  -Received HD session yesterday on 09/19/2020, tolerated the treatment well. -Keppra changed to Depakote to prevent aggression.  Loading dose of 20 mg/kg with maintenance dose of 750 mg 3 times daily for seizures and mood stabilization.  Valproic acid level yesterday on 09/19/2020 was 68 (therapeutic level).  -Still at high risk for delirium.  Recommendations: -      Continue Depakote 750 mg 3 times daily for seizures and mood stabilization.  Can be changed from IV to p.o. once patient is stabilized. -      Avoid Keppra due to past recurrent episodes of uncontrollable rage and violent behavior. Aggression and agitation are known side effects of Keppra -       Prolonged QTC-recommend to avoid Keppra, Seroquel. -      Concern for delirium: Try to minimize deliriogenic medications as much as possible (J Am Geriatr Soc. 2012 Apr;60(4):616-31): benzodiazepines, anticholinergics, diphenhydramine, antihistamines,                         narcotics, as well as sleep aids such as Ambien/Lunesta/Sonata. -      EEG    Honor Junes, MD PGY-1, Resident  Electronically signed: Dr. Kerney Elbe

## 2020-09-21 ENCOUNTER — Encounter (HOSPITAL_COMMUNITY): Payer: Self-pay

## 2020-09-21 DIAGNOSIS — F3111 Bipolar disorder, current episode manic without psychotic features, mild: Secondary | ICD-10-CM

## 2020-09-21 DIAGNOSIS — G9340 Encephalopathy, unspecified: Secondary | ICD-10-CM | POA: Diagnosis not present

## 2020-09-21 DIAGNOSIS — Z789 Other specified health status: Secondary | ICD-10-CM | POA: Diagnosis not present

## 2020-09-21 LAB — GLUCOSE, CAPILLARY
Glucose-Capillary: 111 mg/dL — ABNORMAL HIGH (ref 70–99)
Glucose-Capillary: 127 mg/dL — ABNORMAL HIGH (ref 70–99)
Glucose-Capillary: 140 mg/dL — ABNORMAL HIGH (ref 70–99)
Glucose-Capillary: 79 mg/dL (ref 70–99)
Glucose-Capillary: 80 mg/dL (ref 70–99)
Glucose-Capillary: 82 mg/dL (ref 70–99)

## 2020-09-21 LAB — HEPATITIS B SURFACE ANTIGEN: Hepatitis B Surface Ag: NONREACTIVE

## 2020-09-21 LAB — CBC
HCT: 30.8 % — ABNORMAL LOW (ref 39.0–52.0)
Hemoglobin: 10.3 g/dL — ABNORMAL LOW (ref 13.0–17.0)
MCH: 34.2 pg — ABNORMAL HIGH (ref 26.0–34.0)
MCHC: 33.4 g/dL (ref 30.0–36.0)
MCV: 102.3 fL — ABNORMAL HIGH (ref 80.0–100.0)
Platelets: 104 10*3/uL — ABNORMAL LOW (ref 150–400)
RBC: 3.01 MIL/uL — ABNORMAL LOW (ref 4.22–5.81)
RDW: 13.6 % (ref 11.5–15.5)
WBC: 7.9 10*3/uL (ref 4.0–10.5)
nRBC: 0 % (ref 0.0–0.2)

## 2020-09-21 LAB — BASIC METABOLIC PANEL
Anion gap: 20 — ABNORMAL HIGH (ref 5–15)
BUN: 64 mg/dL — ABNORMAL HIGH (ref 6–20)
CO2: 17 mmol/L — ABNORMAL LOW (ref 22–32)
Calcium: 9 mg/dL (ref 8.9–10.3)
Chloride: 98 mmol/L (ref 98–111)
Creatinine, Ser: 9.78 mg/dL — ABNORMAL HIGH (ref 0.61–1.24)
GFR, Estimated: 6 mL/min — ABNORMAL LOW (ref >60)
Glucose, Bld: 69 mg/dL — ABNORMAL LOW (ref 70–99)
Potassium: 4.2 mmol/L (ref 3.5–5.1)
Sodium: 135 mmol/L (ref 135–145)

## 2020-09-21 LAB — HEPATITIS B CORE ANTIBODY, TOTAL: Hep B Core Total Ab: NONREACTIVE

## 2020-09-21 IMAGING — CT CT HEAD CODE STROKE
3 series · 15 of 47 positions shown, 18 images · non-contrast
Comparison: CT head without contrast 05/13/2018.

CLINICAL DATA: Code stroke. Acute onset of left-sided facial droop
beginning at [DATE] a.m. today. Is

EXAM:
CT HEAD WITHOUT CONTRAST
TECHNIQUE: Contiguous axial images were obtained from the base of the skull
through the vertex without intravenous contrast.

[Series 2: head wo · axial · 0.44mm/px · z∈[-107,+23]mm · 9 of 32 slices shown, 12 images]
[im 3/32  brain]
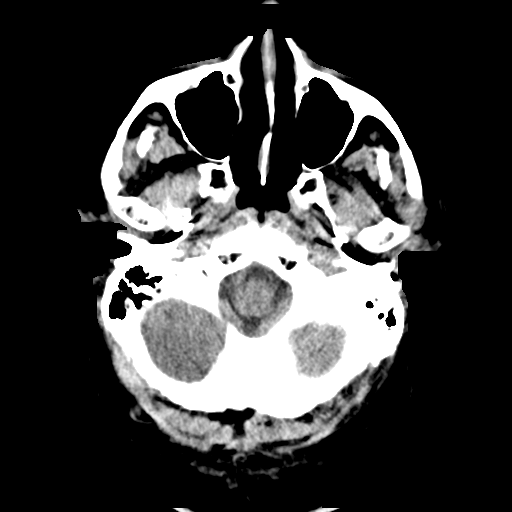
[im 3/32  bone]
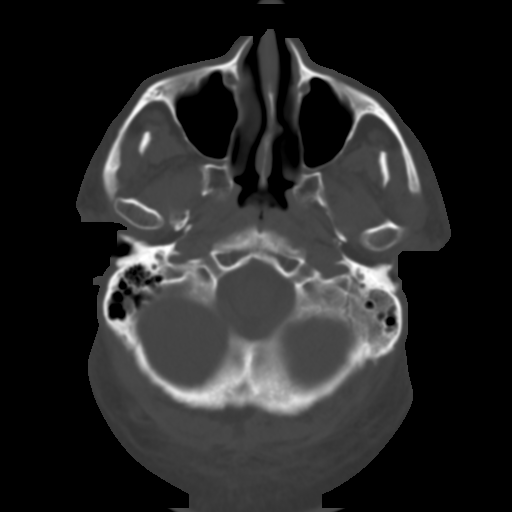
[im 6/32  brain]
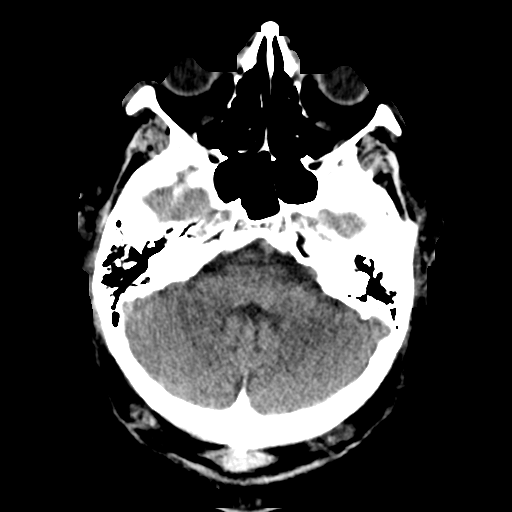
[im 9/32  brain]
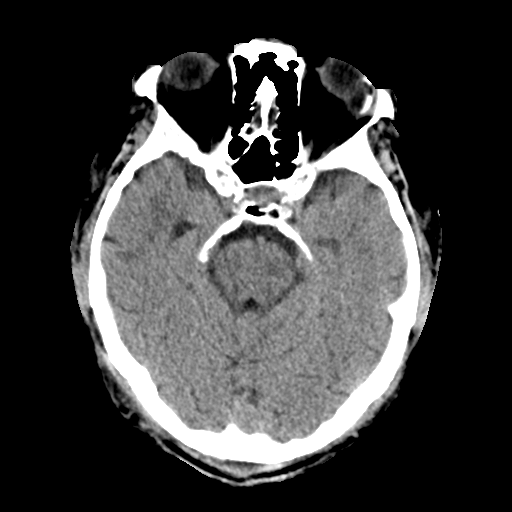
[im 12/32  brain]
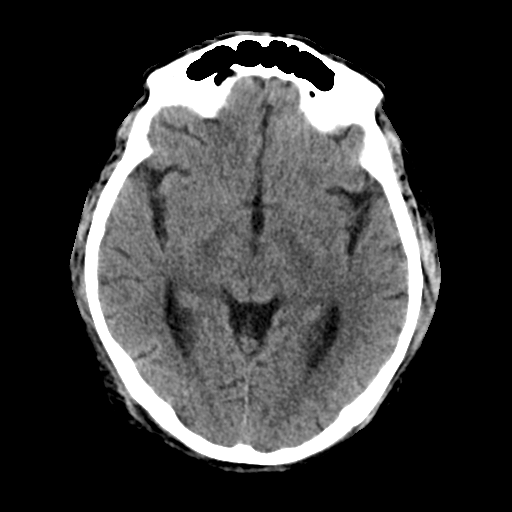
[im 17/32  brain]
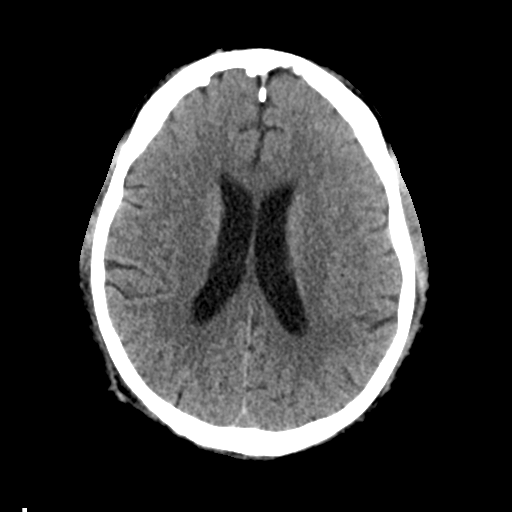
[im 17/32  bone]
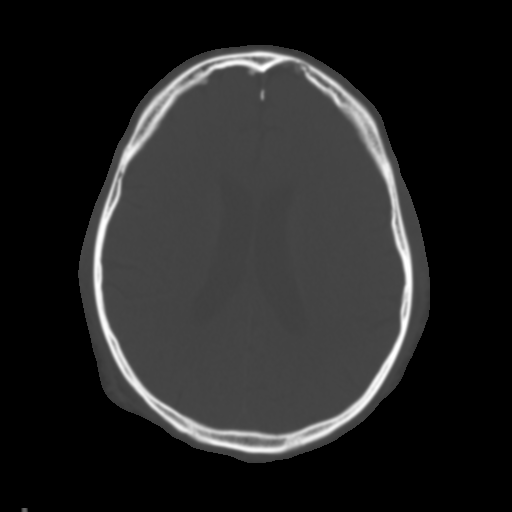
[im 20/32  brain]
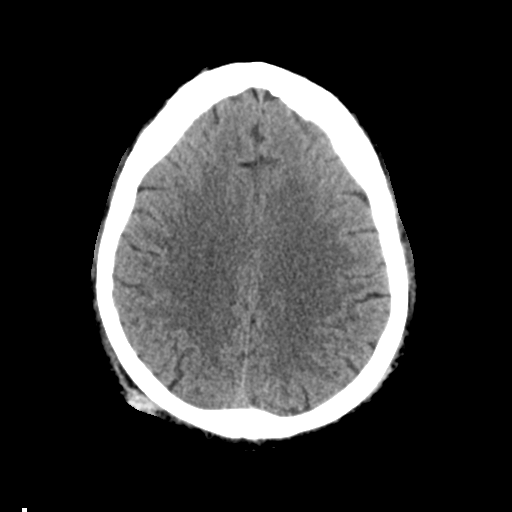
[im 23/32  brain]
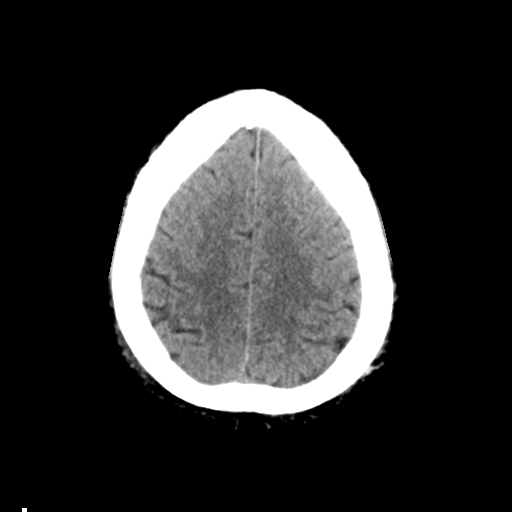
[im 26/32  brain]
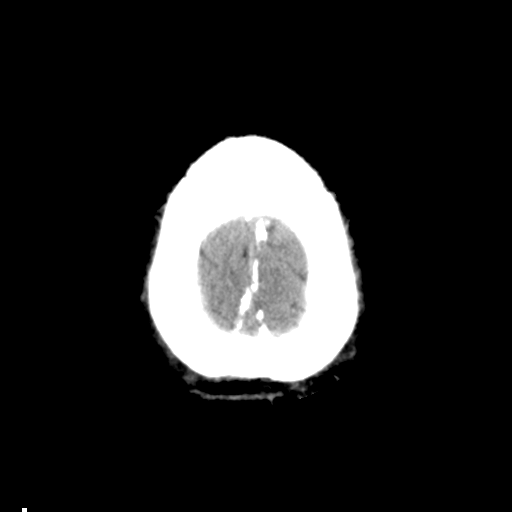
[im 29/32  brain]
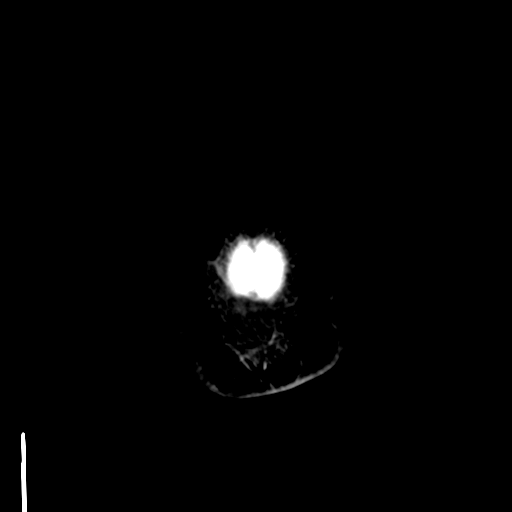
[im 29/32  bone]
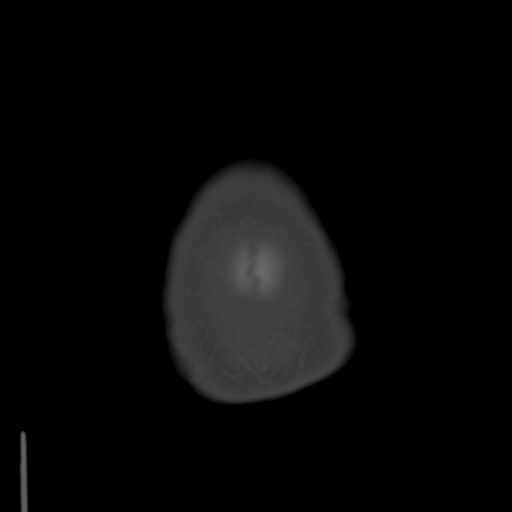

[Series 4: coronal soft tissue · coronal · 0.31mm/px · 3 of 67 slices shown]
[im 23/67  brain]
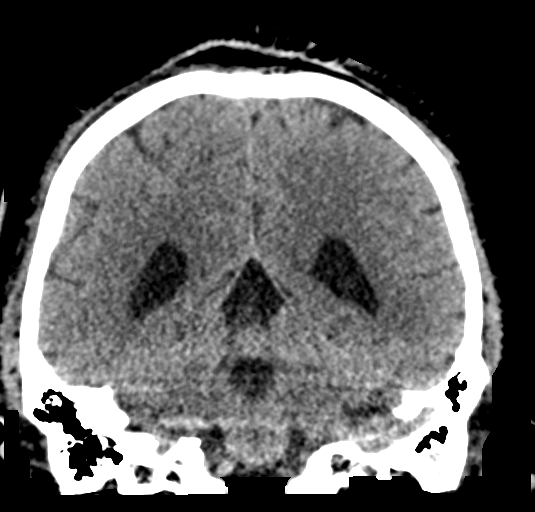
[im 30/67  brain]
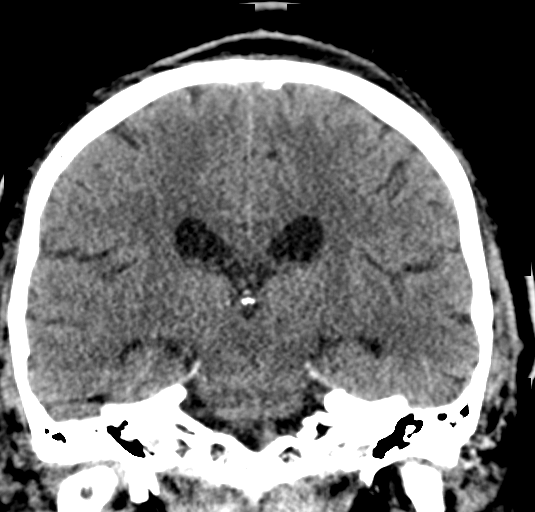
[im 37/67  brain]
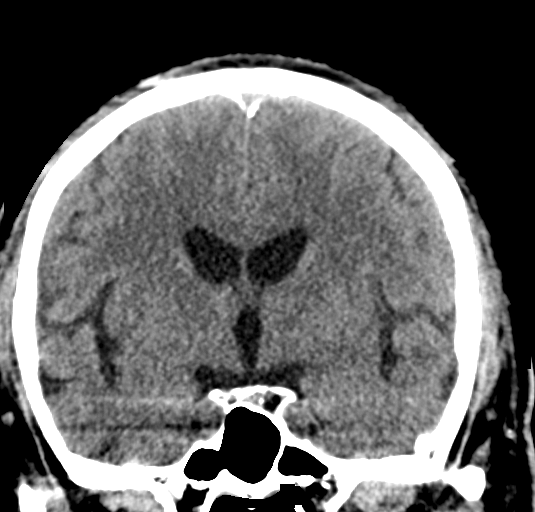

[Series 5: sagittal soft tissue · sagittal · 0.29mm/px · 3 of 56 slices shown]
[im 19/56  brain]
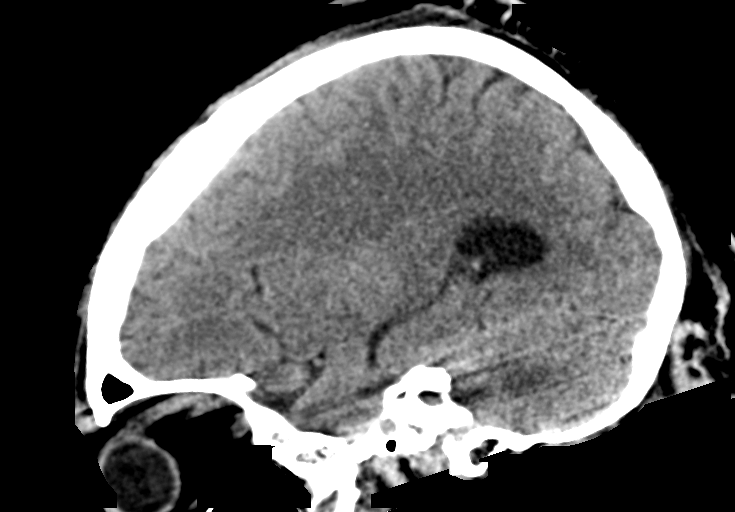
[im 28/56  brain]
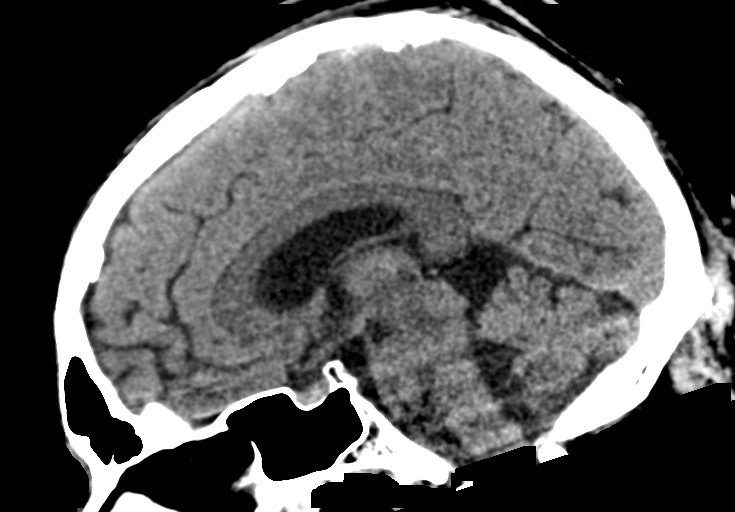
[im 37/56  brain]
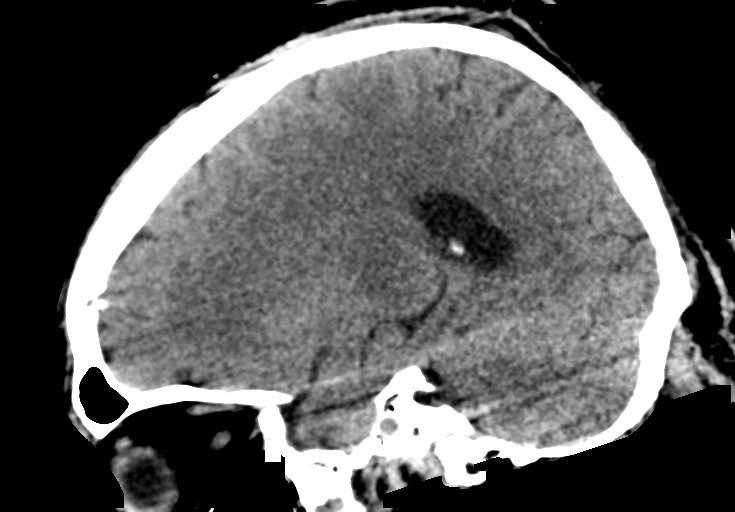

[15 of 47 positions shown; findings below may reference images not displayed]

FINDINGS: Brain: No acute infarct, hemorrhage, or mass lesion is present. The
ventricles are of normal size. No significant extraaxial fluid
collection is present. Significant white matter disease is present.
The brainstem and cerebellum are normal.

Vascular: No hyperdense vessel or unexpected calcification.

Skull: Calvarium is intact. No focal lytic or blastic lesions are
present. Focal hyperdensity in the right parietal scalp is stable,
chronic scarring.

Sinuses/Orbits: The paranasal sinuses and mastoid air cells are
clear. Globes and orbits are within normal limits. Left lacrimal
gland stent is again noted.

ASPECTS (Alberta Stroke Program Early CT Score)

- Ganglionic level infarction (caudate, lentiform nuclei, internal
capsule, insula, M1-M3 cortex): [DATE]

- Supraganglionic infarction (M4-M6 cortex): [DATE]

Total score (0-10 with 10 being normal): [DATE]
IMPRESSION: 1. Normal CT appearance of brain
2. Stable scarring in the right parietal scalp.
3. Left lacrimal gland stents
4. ASPECTS is [DATE]

These results were called by telephone at the time of interpretation
on 06/13/2018 at [DATE] to Dr. DIARMO DALEY , who verbally
acknowledged these results.

## 2020-09-21 NOTE — Plan of Care (Signed)

## 2020-09-21 NOTE — Progress Notes (Signed)
PT Cancellation Note  Patient Details Name: Timothy Clark MRN: [id number] DOB: 10/17/1970   Cancelled Treatment:    Reason Eval/Treat Not Completed: Patient at procedure or test/unavailable (at HD) this morning. PT will continue to follow and evaluate as time/schedule allows.   Hardie Pulley, DPT   Acute Rehabilitation Department Pager #: [phone]   Otho Bellows 09/21/2020, 9:36 AM

## 2020-09-21 NOTE — Progress Notes (Signed)
[facility] NEPHROLOGY PROGRESS NOTE  Assessment/ Plan: Pt is a 50 y.o. yo male  with history of HTN, CHF, bipolar disorder, ESRD on HD MWF at [facility] was brought into the ER for confusion and acting violently towards family, had issue with AV access for the dialysis seen as a consultation to manage ESRD.  [facility]: [facility] EDW127[facility] Bath 3K/2.5CaTime 4:15Heparin1000unit bolus then 600 units/hr. AccessLUE AVGBFR 400DFR 600  Hectoral 15mg IV/HD Epogen1000Units IV/HD Venofer 50 mg IV weekly  #Clotted/thrombosed left upper extremity AV graft: Status post declot of AV graft by IR on 2/9, access working well.  # ESRD: MWF at [facility]: Receiving dialysis today, tolerated well, 4K bath.  UF around 3 L.     # Hypertension: BP elevated, increased amlodipine to 10 mg.  UF during dialysis.  # Anemia of ESRD: Hemoglobin at goal.  Continue weekly Aranesp.  # Metabolic Bone Disease:  Calcium and phosphorus level acceptable.  #Hypokalemia: 3K bath as outpatient, using 4K bath.  #Acute metabolic encephalopathy, suicidal and homicidal attempt with history of bipolar mood disorder: Seen by neurology.  Currently admitted under IVC.  Subjective: Seen and examined.  Tolerating dialysis well.  No complaint. Objective Vital signs in last 24 hours: Vitals:   09/21/20 0340 09/21/20 0755 09/21/20 0757 09/21/20 0815  BP: (!) 142/82 (!) 175/85 (!) 166/82 (!) 179/90  Pulse: 90 91  93  Resp: 20 20    Temp: 98.3 F (36.8 C) 98.9 F (37.2 C)    TempSrc: Oral Oral    SpO2: 98% 100% 100% 100%  Weight:      Height:       Weight change:   Intake/Output Summary (Last 24 hours) at 09/21/2020 0935 Last data filed at 09/21/2020 0400 Gross per 24 hour  Intake 780 ml  Output 550 ml  Net 230 ml       Labs: Basic Metabolic Panel: Recent Labs  Lab 09/19/20 0038 09/20/20 0235 09/21/20 0353  NA 139 138 135  K 3.5 4.1 4.2  CL 101 100 98   CO2 22 22 17*  GLUCOSE 93 81 69*  BUN 63* 48* 64*  CREATININE 10.31* 8.08* 9.78*  CALCIUM 8.4* 8.8* 9.0  PHOS  --  4.2  --    Liver Function Tests: Recent Labs  Lab 09/17/20 1639  AST 15  ALT 16  ALKPHOS 40  BILITOT 0.7  PROT 6.9  ALBUMIN 3.7   No results for input(s): LIPASE, AMYLASE in the last 168 hours. No results for input(s): AMMONIA in the last 168 hours. CBC: Recent Labs  Lab 09/17/20 1639 09/18/20 0317 09/19/20 0038 09/20/20 0235 09/21/20 0353  WBC 5.8 6.7 5.5 7.4 7.9  NEUTROABS 3.8  --   --   --   --   HGB 9.8* 10.9* 10.2* 11.0* 10.3*  HCT 28.3* 32.3* 29.9* 31.4* 30.8*  MCV 100.4* 99.7 101.0* 100.6* 102.3*  PLT 91* 105* 101* 122* 104*   Cardiac Enzymes: No results for input(s): CKTOTAL, CKMB, CKMBINDEX, TROPONINI in the last 168 hours. CBG: Recent Labs  Lab 09/20/20 1157 09/20/20 1737 09/20/20 2005 09/21/20 0020 09/21/20 0348  GLUCAP 104* 81 114* 79 80    Iron Studies: No results for input(s): IRON, TIBC, TRANSFERRIN, FERRITIN in the last 72 hours. Studies/Results: EEG  Result Date: 09/20/2020 YLora Havens MD     09/20/2020  2:01 PM Patient Name: DHALEY KUCHERAMRN: [id number] Attending: PLora HavensReferring Physician/Provider: Dr EKerney ElbeDate: 09/20/2020  Duration: 26.28 mins Patient history:  50 year old male presented acutely encephalopathic. EEG to evaluate for seizure. Level of alertness: Awake AEDs during EEG study: VPA Technical aspects: This EEG study was done with scalp electrodes positioned according to the 10-20 International system of electrode placement. Electrical activity was acquired at a sampling rate of '500Hz'$  and reviewed with a high frequency filter of '70Hz'$  and a low frequency filter of '1Hz'$ . EEG data were recorded continuously and digitally stored. Description: The posterior dominant rhythm consists of 8 Hz activity of moderate voltage (25-35 uV) seen predominantly in posterior head regions, symmetric and  reactive to eye opening and eye closing. Hyperventilation and photic stimulation were not performed.   Patient was noted to have chewing movements during study, was able to stop and answer when asked. Concomitant eeg before, during and after the events didn't show any eeg change to suggest seizure. IMPRESSION: This study is within normal limits. No seizures or epileptiform discharges were seen throughout the recording. Chewing movements were noted without concomitant eeg change and were NOT epileptic. Priyanka Barbra Sarks   CT ABDOMEN PELVIS WO CONTRAST  Result Date: 09/20/2020 CLINICAL DATA:  50 year old male with abdominal pain. EXAM: CT ABDOMEN AND PELVIS WITHOUT CONTRAST TECHNIQUE: Multidetector CT imaging of the abdomen and pelvis was performed following the standard protocol without IV contrast. COMPARISON:  CT abdomen pelvis dated 07/23/2020. FINDINGS: Evaluation of this exam is limited in the absence of intravenous contrast. Lower chest: Minimal bibasilar atelectasis. The visualized lung bases are otherwise clear. No intra-abdominal free air or free fluid. Hepatobiliary: The liver is unremarkable. No intrahepatic biliary dilatation. Probable vicarious excretion of recently administered contrast within the gallbladder. No pericholecystic fluid. Pancreas: Unremarkable. No pancreatic ductal dilatation or surrounding inflammatory changes. Spleen: Normal in size without focal abnormality. Adrenals/Urinary Tract: The adrenal glands unremarkable. Moderate bilateral renal parenchyma atrophy. A 9 mm exophytic lesion from the posterior upper pole of the right kidney (81/6) appears similar to prior CT and not characterized on the CT. This can be better evaluated with ultrasound. There is no hydronephrosis or nephrolithiasis on either side. The visualized ureters appear unremarkable. High attenuating content within the urinary bladder, likely excreted contrast from recent IV administration. Stomach/Bowel: There is no  bowel obstruction or active inflammation. The appendix is normal. Vascular/Lymphatic: Mild atherosclerotic calcification. The abdominal aorta and IVC are otherwise unremarkable. No portal venous gas. There is no adenopathy. Reproductive: The prostate and seminal vesicles are grossly unremarkable no pelvic mass. Other: None Musculoskeletal: Degenerative changes of the spine. No acute osseous pathology. IMPRESSION: 1. No acute intra-abdominal or pelvic pathology. No bowel obstruction. Normal appendix. 2. Moderate bilateral renal parenchyma atrophy. 3. Aortic Atherosclerosis (ICD10-I70.0). Electronically Signed   By: Anner Crete M.D.   On: 09/20/2020 18:22   IR THROMBECTOMY AV FISTULA W/THROMBOLYSIS/PTA INC/SHUNT/IMG LEFT  Result Date: 09/19/2020 INDICATION: 50 year old gentleman with clotted left upper arm AV dialysis graft presents to interventional radiology for attempted declot. EXAM: Left upper extremity AV dialysis graft declot MEDICATIONS: None. ANESTHESIA/SEDATION: Moderate Sedation Time:  55 minutes The patient was continuously monitored during the procedure by the interventional radiology nurse under my direct supervision. 1 mg of IV Versed and 50 mcg of IV fentanyl utilized for moderate sedation. FLUOROSCOPY TIME:  Fluoroscopy Time: 9 minutes 6 seconds (23 mGy). COMPLICATIONS: None immediate. PROCEDURE: Informed written consent was obtained from the patient after a thorough discussion of the procedural risks, benefits and alternatives. All questions were addressed. Maximal Sterile Barrier Technique was utilized including caps, mask,  sterile gowns, sterile gloves, sterile drape, hand hygiene and skin antiseptic. A timeout was performed prior to the initiation of the procedure. Patient positioned supine on the procedure table. The left upper arm prepped and draped in usual fashion. Ultrasound image documenting thrombosed AV graft was obtained and placed in permanent medical record. Sterile ultrasound  gel and sterile ultrasound probe cover utilized throughout the procedure. Utilizing continuous ultrasound guidance, antegrade access was obtained into the AV graft with a 21 gauge needle. 21 gauge needle exchanged for a transitional dilator set over 0.018 inch guidewire. Transitional dilator set exchanged for 6 French sheath over 0.035 inch guidewire. Kumpe catheter and Glidewire utilized to gain access beyond the venous anastomosis which was clearly severely stenosed. Venogram performed under fluoroscopy showed patent left basilic vein. The focal stenosis at the venous anastomosis was plastied with the 8 mm balloon. Utilizing continuous ultrasound guidance, retrograde access was obtained into the AV graft with a 21 gauge needle. 21 gauge needle exchanged for a transitional dilator set over 0.018 inch guidewire. Transitional dilator set exchanged for 6 French sheath over 0.035 inch guidewire. Kumpe the catheter advanced across the arterial anastomosis into the brachial artery. Kumpe catheter exchanged for a Fogarty balloon over a Glidewire. 3000 units of IV heparin was given. The distal half of the graft was plasty with 8 mm balloon through the antegrade access. Arterial plug fold with Fogarty balloon through the retrograde access. Flow returned within the graft. Fogarty balloon was utilized to push additional thrombus centrally. Kumpe the catheter advanced across anastomosis the brachial artery. Fistulagram demonstrated no significant stenosis of the arterial inflow, anastomosis, or proximal outflow. Retrograde access sheath was removed and hemostasis achieved with a Woggle. Moderate hemodynamically significant stenosis remained at the venous anastomosis which was plasty with a 9 mm balloon resulting in excellent luminal gain and improve flow. Central venogram demonstrated no significant stenosis of the basilic, axillary, subclavian, brachiocephalic veins and superior vena cava. Antegrade sheath was removed and  hemostasis achieved with pursestring suture. IMPRESSION: Successful declot of left brachial basilic AV graft. ACCESS: This access remains amenable to future percutaneous interventions as clinically indicated. Electronically Signed   By: Miachel Roux M.D.   On: 09/19/2020 16:58    Medications: Infusions:   Scheduled Medications: . amLODipine  10 mg Oral Daily  . atorvastatin  10 mg Oral QHS  . Chlorhexidine Gluconate Cloth  6 each Topical Q0600  . darbepoetin (ARANESP) injection - DIALYSIS  60 mcg Intravenous Q Wed-HD  . dorzolamide-timolol  1 drop Both Eyes BID  . folic acid  1 mg Oral Daily  . heparin injection (subcutaneous)  5,000 Units Subcutaneous Q8H  . insulin aspart  0-15 Units Subcutaneous Q4H  . metoCLOPramide (REGLAN) injection  5 mg Intravenous TID AC  . pantoprazole  40 mg Oral BID AC  . valproic acid  750 mg Oral TID  . ziprasidone  60 mg Oral BID WC    have reviewed scheduled and prn medications.  Physical Exam: General:NAD, comfortable Heart:RRR, s1s2 nl Lungs: Clear b/l, no crackle Abdomen:soft, Non-tender, non-distended Extremities:No LE edema Dialysis Access: AV graft has good thrill and bruit.  Dron Prasad Bhandari 09/21/2020,9:35 AM  LOS: 4 days
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
## 2020-09-21 NOTE — Consult Note (Signed)
Addendum -please see yesterday's consult note for full evaluation.  Patient has been psychiatrically cleared.  Collateral information was obtained by sister, who is in agreement with patient going to a skilled nursing facility.  She reports concerns regarding his increased agitation and rage, and would like for him to receive additional services prior to returning home.  Patient is receiving new medication to help target and manage these behaviors.  Will benefit from outpatient psychiatric services for medication management.  At this time we can discontinue sitter, and rescind his IVC.

## 2020-09-21 NOTE — Progress Notes (Signed)
OT Cancellation Note  Patient Details Name: Timothy Clark MRN: [id number] DOB: [birth year]   Cancelled Treatment:    Reason Eval/Treat Not Completed: Patient at procedure or test/ unavailable (HD). Will return as schedule allows.   [place], OTR/L Acute Rehab Pager: [phone] Office: [phone] 09/21/2020, 9:33 AM

## 2020-09-21 NOTE — Evaluation (Signed)
Occupational Therapy Evaluation Patient Details Name: Timothy Clark MRN: [id number] DOB: [birth year] Today's Date: 09/21/2020    History of Present Illness The pt is a 50 yo male presenting with confusion, demonstrating self-harm and acting violently towards his family. Pt found to have acute metabolic encephalopathy. PMH includes: ESRD on HD MWF, seizures, HTN, DM II, CHF, depression, bipolar disorder.   Clinical Impression   PTA, pt was living with his father and reports he was independent with ADLs and simple IADLs. Pt currently requiring Mod A for UB ADLs, Max A for LB ADLs, and Mod A +2 for functional transfers. Pt presenting with deficits in cognition, balance, strength, and safety. When discussing pt's currently functional performance, pt demonstrating poor awareness and reporting he doesn't feel far off baseline despite requiring increased assistance for ADLs and mobility. Pt would benefit from further acute OT to facilitate safe dc. Recommend dc to SNF for further OT to optimize safety, independence with ADLs, and return to PLOF.     Follow Up Recommendations  SNF    Equipment Recommendations  Other (comment) (Defer to next venue)    Recommendations for Other Services PT consult     Precautions / Restrictions Precautions Precautions: Fall Precaution Comments: pt is blind Restrictions Weight Bearing Restrictions: No      Mobility Bed Mobility Overal bed mobility: Needs Assistance Bed Mobility: Supine to Sit     Supine to sit: Mod assist     General bed mobility comments: modA with significantly increased time for all mobility. Cues for sequencing and modA to complete elevation of trunk from flat bed    Transfers Overall transfer level: Needs assistance Equipment used: 2 person hand held assist Transfers: Sit to/from [facility] Sit to Stand: Min assist;+2 physical assistance Stand pivot transfers: Mod assist;+2 physical assistance        General transfer comment: minA to power up, but with very poor eccentric lower as pt does not flex at hips at all for stand-sit transition. modA of 2 to complete small lateral steps to pivot to recliner. cues for safety and modA of 2 to maintain stability    Balance Overall balance assessment: Needs assistance Sitting-balance support: Single extremity supported Sitting balance-Leahy Scale: Fair     Standing balance support: Bilateral upper extremity supported Standing balance-Leahy Scale: Poor Standing balance comment: reliant on external support                           ADL either performed or assessed with clinical judgement   ADL Overall ADL's : Needs assistance/impaired Eating/Feeding: Moderate assistance;Sitting Eating/Feeding Details (indicate cue type and reason): RN reports that pt does not attempt to self feed even with food organized on table Grooming: Wash/dry face;Minimal assistance;Sitting   Upper Body Bathing: Moderate assistance   Lower Body Bathing: Maximal assistance;Sit to/from stand   Upper Body Dressing : Moderate assistance;Sitting   Lower Body Dressing: Maximal assistance;+2 for physical assistance;Sit to/from stand Lower Body Dressing Details (indicate cue type and reason): Pt attempting to don socks while sitting at EOB. Pt bringing ankles to knees but then reaching out with sock to mid calf. Pt attempting for significant time and unable to problem solve solution. Initating and donning socks over toes, pt contieus to present with poor cognition and grasp to bring sock over heel. Max A to don socks overall Toilet Transfer: Moderate assistance;+2 for physical assistance;Stand-pivot (simulated to recliner)  Functional mobility during ADLs: Moderate assistance;+2 for physical assistance;+2 for safety/equipment General ADL Comments: Pt presenting with poor cognition, balance, and strength     Vision Baseline Vision/History: Legally  blind Patient Visual Report:  (reports he can see shadows or outlines)       Perception     Praxis      Pertinent Vitals/Pain Pain Assessment: No/denies pain     Hand Dominance Right   Extremity/Trunk Assessment Upper Extremity Assessment Upper Extremity Assessment: Generalized weakness (Poor FM)   Lower Extremity Assessment Lower Extremity Assessment: Defer to PT evaluation   Cervical / Trunk Assessment Cervical / Trunk Assessment: Normal (large body habitus)   Communication Communication Communication: No difficulties   Cognition Arousal/Alertness: Awake/alert Behavior During Therapy: Flat affect Overall Cognitive Status: Impaired/Different from baseline Area of Impairment: Attention;Following commands;Safety/judgement;Awareness;Problem solving                   Current Attention Level: Focused   Following Commands: Follows one step commands with increased time Safety/Judgement: Decreased awareness of safety;Decreased awareness of deficits Awareness: Intellectual Problem Solving: Slow processing;Decreased initiation;Difficulty sequencing;Requires verbal cues;Requires tactile cues General Comments: pt with very slowed responses, at times needing repeated multimodal cues and increased time to respond. Pt with significant deficit in awareness of deficits and assist needed for safety. Poor problem solving, even with intermittent cueing or initiation of task   General Comments  VSS through session, pt with flat affect but answering all questions posed by therapists. Pt remains adamant he would be able to manage at home despite assist given through session    Exercises     Shoulder Instructions      Home Living Family/patient expects to be discharged to:: Private residence Living Arrangements: Parent Available Help at Discharge: Family Type of Home: House Home Access: Stairs to enter Technical brewer of Steps: 1 Entrance Stairs-Rails: Left Home Layout:  One level     Bathroom Shower/Tub: Occupational psychologist: [place]: Shower seat;Grab bars - tub/shower;Hand held shower head   Additional Comments: pt lives with his father, sister lives nearby      Prior Functioning/Environment Level of Independence: Independent        Comments: pt reports independence with mobility without AD, but states he is able to complete ADLs without assist, but relies on father (who he lives with) to complete IADLs such as cooking and cleaning. His sister takes him for outings        OT Problem List: Decreased range of motion;Decreased strength;Decreased activity tolerance;Impaired balance (sitting and/or standing);Decreased knowledge of use of DME or AE;Decreased knowledge of precautions;Decreased safety awareness;Pain      OT Treatment/Interventions: Self-care/ADL training;Therapeutic exercise;Energy conservation;DME and/or AE instruction;Therapeutic activities;Patient/family education;Cognitive remediation/compensation;Visual/perceptual remediation/compensation    OT Goals(Current goals can be found in the care plan section) Acute Rehab OT Goals Patient Stated Goal: return home OT Goal Formulation: With patient Time For Goal Achievement: 10/05/20 Potential to Achieve Goals: Good  OT Frequency: Min 2X/week   Barriers to D/C:            Co-evaluation PT/OT/SLP Co-Evaluation/Treatment: Yes Reason for Co-Treatment: For patient/therapist safety;To address functional/ADL transfers PT goals addressed during session: Mobility/safety with mobility;Balance OT goals addressed during session: ADL's and self-care      AM-PAC OT "6 Clicks" Daily Activity     Outcome Measure Help from another person eating meals?: A Lot Help from another person taking care of personal grooming?: A Little Help  from another person toileting, which includes using toliet, bedpan, or urinal?: A Lot Help from another person bathing (including  washing, rinsing, drying)?: A Lot Help from another person to put on and taking off regular upper body clothing?: A Lot Help from another person to put on and taking off regular lower body clothing?: A Lot 6 Click Score: 13   End of Session Equipment Utilized During Treatment: Gait belt Nurse Communication: Mobility status  Activity Tolerance: Patient tolerated treatment well;Patient limited by fatigue Patient left: in chair;with call bell/phone within reach;with chair alarm set;with restraints reapplied  OT Visit Diagnosis: Unsteadiness on feet (R26.81);Other abnormalities of gait and mobility (R26.89);Muscle weakness (generalized) (M62.81)                Time: [id number] OT Time Calculation (min): 32 min Charges:  OT General Charges $OT Visit: 1 Visit OT Evaluation $OT Eval Moderate Complexity: [place], OTR/L Acute Rehab Pager: [phone] Office: [place] 09/21/2020, 5:48 PM

## 2020-09-21 NOTE — Evaluation (Signed)
Physical Therapy Evaluation Patient Details Name: Timothy Clark MRN: [id number] DOB: 10/10/1970 Today's Date: 09/21/2020   History of Present Illness  The pt is a 50 yo male presenting with confusion, demonstrating self-harm and acting violently towards his family. Pt found to have acute metabolic encephalopathy. PMH includes: ESRD on HD MWF, seizures, HTN, DM II, CHF, depression, bipolar disorder.  Clinical Impression   Pt in bed upon arrival of PT, agreeable to evaluation at this time. Prior to admission the pt reports he was completely independent with mobility without use of AD, independent with ADLs, and has had no recent falls. The pt now presents with limitations in functional mobility, strength, power, motor planning and control, endurance, and stability due to above dx, and will continue to benefit from skilled PT to address these deficits. The pt required modA to complete bed mobility with significant increased time and assist to initiate and complete all mobility. The pt was then able to transition to recliner from EOB, but requires mod/maxA of 2 to steady and take lateral steps with HHA of 2. The pt currently requires significant physical assist with all mobility, but also presents with decreased cognition that impacts safety awareness, problem solving, and the pt's ability to mobilize safely without assist. Recommend SNF for continued rehab to facilitate return to pt's prior level of mobility.        Follow Up Recommendations SNF;Supervision/Assistance - 24 hour    Equipment Recommendations   (defer to post acute)    Recommendations for Other Services       Precautions / Restrictions Precautions Precautions: Fall Precaution Comments: pt is blind Restrictions Weight Bearing Restrictions: No      Mobility  Bed Mobility Overal bed mobility: Needs Assistance Bed Mobility: Supine to Sit     Supine to sit: Mod assist     General bed mobility comments: modA with  significantly increased time for all mobility. Cues for sequencing and modA to complete elevation of trunk from flat bed    Transfers Overall transfer level: Needs assistance Equipment used: 2 person hand held assist Transfers: Sit to/from [facility] Sit to Stand: Min assist;+2 physical assistance Stand pivot transfers: Mod assist;+2 physical assistance       General transfer comment: minA to power up, but with very poor eccentric lower as pt does not flex at hips at all for stand-sit transition. modA of 2 to complete small lateral steps to pivot to recliner. cues for safety and modA of 2 to maintain stability  Ambulation/Gait Ambulation/Gait assistance: Mod assist;+2 physical assistance Gait Distance (Feet): 3 Feet Assistive device: 2 person hand held assist Gait Pattern/deviations: Step-to pattern;Decreased stride length;Trunk flexed Gait velocity: decrease   General Gait Details: pt with small lateral steps to recliner, but modA to steady and maintain upright. decreased saftey awareness and endurance      Balance Overall balance assessment: Needs assistance Sitting-balance support: Single extremity supported Sitting balance-Leahy Scale: Fair     Standing balance support: Bilateral upper extremity supported Standing balance-Leahy Scale: Poor Standing balance comment: reliant on external support                             Pertinent Vitals/Pain Pain Assessment: No/denies pain    Home Living Family/patient expects to be discharged to:: Private residence Living Arrangements: Parent Available Help at Discharge: Family Type of Home: House Home Access: Stairs to enter Entrance Stairs-Rails: Left Entrance Stairs-Number of Steps: 1 Home  Layout: One level Home Equipment: Shower seat;Grab bars - tub/shower;Hand held shower head Additional Comments: pt lives with his father, sister lives nearby    Prior Function Level of Independence: Independent          Comments: pt reports independence with mobility without AD, but states he is able to complete ADLs without assist, but relies on father (who he lives with) to complete IADLs such as cooking and cleaning. His sister takes him for outings     Hand Dominance   Dominant Hand: Right    Extremity/Trunk Assessment   Upper Extremity Assessment Upper Extremity Assessment: Defer to OT evaluation    Lower Extremity Assessment Lower Extremity Assessment: Generalized weakness    Cervical / Trunk Assessment Cervical / Trunk Assessment: Normal (large body habitus)  Communication   Communication: No difficulties  Cognition Arousal/Alertness: Awake/alert Behavior During Therapy: Flat affect Overall Cognitive Status: Impaired/Different from baseline Area of Impairment: Attention;Following commands;Safety/judgement;Awareness;Problem solving                   Current Attention Level: Focused   Following Commands: Follows one step commands with increased time Safety/Judgement: Decreased awareness of safety;Decreased awareness of deficits Awareness: Intellectual Problem Solving: Slow processing;Decreased initiation;Difficulty sequencing;Requires verbal cues;Requires tactile cues General Comments: pt with very slowed responses, at times needing repeated multimodal cues and increased time to respond. Pt with significant deficit in awareness of deficits and assist needed for safety. Poor problem solving, even with intermittent cueing or initiation of task      General Comments General comments (skin integrity, edema, etc.): VSs through session, pt with flat affect but answering all questions posed by therapists. Pt remains adamant he would be able to manage at home despite assist given through session    Exercises     Assessment/Plan    PT Assessment Patient needs continued PT services  PT Problem List Decreased strength;Decreased range of motion;Decreased activity  tolerance;Decreased balance;Decreased mobility;Decreased coordination;Decreased cognition;Decreased safety awareness;Obesity       PT Treatment Interventions DME instruction;Gait training;Functional mobility training;Therapeutic activities;Therapeutic exercise;Balance training;Cognitive remediation;Patient/family education    PT Goals (Current goals can be found in the Care Plan section)  Acute Rehab PT Goals Patient Stated Goal: return home PT Goal Formulation: With patient Time For Goal Achievement: 10/05/20 Potential to Achieve Goals: Good    Frequency Min 2X/week   Barriers to discharge Decreased caregiver support pt requires significant assist of 2 for mobility    Co-evaluation PT/OT/SLP Co-Evaluation/Treatment: Yes Reason for Co-Treatment: Necessary to address cognition/behavior during functional activity;For patient/therapist safety;To address functional/ADL transfers PT goals addressed during session: Mobility/safety with mobility;Balance         AM-PAC PT "6 Clicks" Mobility  Outcome Measure Help needed turning from your back to your side while in a flat bed without using bedrails?: A Little Help needed moving from lying on your back to sitting on the side of a flat bed without using bedrails?: A Lot Help needed moving to and from a bed to a chair (including a wheelchair)?: A Lot Help needed standing up from a chair using your arms (e.g., wheelchair or bedside chair)?: A Lot Help needed to walk in hospital room?: A Lot Help needed climbing 3-5 steps with a railing? : Total 6 Click Score: 12    End of Session Equipment Utilized During Treatment: Gait belt Activity Tolerance: Patient tolerated treatment well Patient left: in chair;with call bell/phone within reach;with chair alarm set;with restraints reapplied (posey belt) Nurse Communication: Mobility status PT  Visit Diagnosis: Unsteadiness on feet (R26.81);Other abnormalities of gait and mobility (R26.89)     Time: [id number] PT Time Calculation (min) (ACUTE ONLY): 31 min   Charges:   PT Evaluation $PT Eval Moderate Complexity: 1 Mod          Karma Ganja, PT, DPT   Acute Rehabilitation Department Pager #: [phone]  Otho Bellows 09/21/2020, 4:47 PM

## 2020-09-21 NOTE — Progress Notes (Addendum)
Neurology Progress Note  Subjective: No acute overnight events. Denies any new complaints and ready to go to hemodialysis session.  Objective: Current vital signs: BP 138/79   Pulse 100   Temp 98.9 F (37.2 C) (Oral)   Resp 20   Ht '5\' 11"'$  (1.803 m)   Wt 124.7 kg   SpO2 100%   BMI 38.35 kg/m  Vital signs in last 24 hours: Temp:  [98.3 F (36.8 C)-99.7 F (37.6 C)] 98.9 F (37.2 C) (02/11 0755) Pulse Rate:  [87-112] 100 (02/11 0945) Resp:  [10-20] 20 (02/11 0755) BP: (134-179)/(76-101) 138/79 (02/11 0945) SpO2:  [96 %-100 %] 100 % (02/11 0945)  Physical exam GENERAL: Awake, alert, lying comfortably in bed, in restraints all 4 extremities, NAD HEENT: Normocephalic and atraumatic, dry mm LUNGS: Normal respiratory effort.  CV: RRR on tele.  ABDOMEN: Soft, nontender Ext: warm   NEURO:  Mental Status: Alert, awake, oriented to time place and person.  Naming, repetition, fluency, and comprehension intact.  Cranial Nerves:  II: PERRL.  In the context of bilateral intraocular lens and extensive history of ocular disease, unable to see objects/people clearly. III, IV, VI: EOMI. Eyelids elevate symmetrically.  V: Sensation is intact to light touch and symmetrical to face.  VII: Smile is symmetrical. Able to puff cheeks and raise eyebrows.  VIII: hearing intact to voice. IX, X: Palate elevates symmetrically. Phonation is normal.  [id number] shrug 5/5. XII: tongue is midline without fasciculations. Motor: 5/5 strength to all muscle groups tested.  Tone: is normal and bulk is normal Sensation- Intact to light touch bilaterally Coordination: FTN intact bilaterally, HKS: no ataxia in BLE. No drift.  DTRs: 2+ throughout Gait- Deferred  Medications  Current Facility-Administered Medications:  .  acetaminophen (TYLENOL) tablet 650 mg, 650 mg, Oral, Q6H PRN, 650 mg at 09/20/20 0940 **OR** acetaminophen (TYLENOL) suppository 650 mg, 650 mg, Rectal, Q6H PRN, Emokpae, Ejiroghene E,  MD .  amLODipine (NORVASC) tablet 10 mg, 10 mg, Oral, Daily, Rosita Fire, MD .  atorvastatin (LIPITOR) tablet 10 mg, 10 mg, Oral, QHS, Rai, Ripudeep K, MD, 10 mg at 09/20/20 2203 .  Chlorhexidine Gluconate Cloth 2 % PADS 6 each, 6 each, Topical, Q0600, Rosita Fire, MD, 6 each at 09/21/20 [phone] .  Darbepoetin Alfa (ARANESP) injection 60 mcg, 60 mcg, Intravenous, Q Wed-HD, Rosita Fire, MD, 60 mcg at 09/19/20 1446 .  dorzolamide-timolol (COSOPT) 22.3-6.8 MG/ML ophthalmic solution 1 drop, 1 drop, Both Eyes, BID, Rai, Ripudeep K, MD, 1 drop at 09/20/20 2203 .  folic acid (FOLVITE) tablet 1 mg, 1 mg, Oral, Daily, Rai, Ripudeep K, MD, 1 mg at 09/20/20 0941 .  heparin injection 5,000 Units, 5,000 Units, Subcutaneous, Q8H, Rai, Ripudeep K, MD, 5,000 Units at 09/21/20 0513 .  hydrALAZINE (APRESOLINE) injection 10 mg, 10 mg, Intravenous, Q6H PRN, Rai, Ripudeep K, MD, 10 mg at 09/20/20 0050 .  insulin aspart (novoLOG) injection 0-15 Units, 0-15 Units, Subcutaneous, Q4H, Emokpae, Ejiroghene E, MD, 3 Units at 09/19/20 2053 .  labetalol (NORMODYNE) injection 10 mg, 10 mg, Intravenous, Q2H PRN, Emokpae, Ejiroghene E, MD .  LORazepam (ATIVAN) injection 2 mg, 2 mg, Intravenous, Q4H PRN, Emokpae, Ejiroghene E, MD, 2 mg at 09/19/20 1716 .  metoCLOPramide (REGLAN) injection 5 mg, 5 mg, Intravenous, TID AC, Rai, Ripudeep K, MD, 5 mg at 09/20/20 1842 .  pantoprazole (PROTONIX) EC tablet 40 mg, 40 mg, Oral, BID AC, Rai, Ripudeep K, MD, 40 mg at 09/20/20 1842 .  polyethylene glycol (  MIRALAX / GLYCOLAX) packet 17 g, 17 g, Oral, Daily PRN, Emokpae, Ejiroghene E, MD .  valproic acid (DEPAKENE) 250 MG capsule 750 mg, 750 mg, Oral, TID, Dagar, Anjali, MD, 750 mg at 09/20/20 2203 .  ziprasidone (GEODON) capsule 60 mg, 60 mg, Oral, BID WC, Emokpae, Ejiroghene E, MD, 60 mg at 09/20/20 1844    Assessment: 50 year old male presented acutely encephalopathic and admitted to identify organic causes or  contributing factors. -Alert, awake, oriented to time place and person this morning.  Denies auditory or visual hallucinations and  does not appear to be acting as though he is responding to any internal stimuli.  -Received HD session on 09/19/2020, receiving  today on 09/21/2020 -Keppra changed to Depakote to prevent aggression.  Loading dose of 20 mg/kg with maintenance dose of 750 mg 3 times daily for seizures and mood stabilization.  Valproic acid level yesterday on 09/19/2020 was 68 (therapeutic level).  - Changed to PO Depakote by primary team yesterday. - EEG negative for seizure or epileptiform discharges. -Still at high risk for delirium.  Recommendations: -      Continue Depakote 750 mg 3 times daily for seizures and mood stabilization.   -      Avoid Keppra due to past recurrent episodes of uncontrollable rage and violent behavior.                 Aggression and agitation are known side effects of Keppra -      Prolonged QTC-recommend to avoid Keppra, Seroquel. -      Concern for delirium: Try to minimize deliriogenic medications as much as possible (J Am              Geriatr Soc. 2012 Apr;60(4):616-31): benzodiazepines, anticholinergics, diphenhydramine,              antihistamines,narcotics, as well as sleep aids such as Ambien/Lunesta/Sonata. -      Neurology is signing off, but will be available for any questions.   Honor Junes, MD PGY-1, Resident  Electronically signed: Dr. Kerney Elbe

## 2020-09-21 NOTE — TOC Progression Note (Signed)
Transition of Care [facility]) - Progression Note    Patient Details  Name: Timothy Clark MRN: [id number] Date of Birth: April 12, 1971  Transition of Care [facility]) CM/SW [place], [place] Phone Number: 09/21/2020, 1:24 PM  Clinical Narrative:     CSW spoke with patient's sister,Eddie- CSW wanted to clarity with patient's family SNF is not an option placement. She states understanding and family wants the patient to return home. However, she explained family has had unexpected death and will participate in a funeral on Sunday- she expressed concerns that if patient is discharged over the weekend no family will be able to be with him or pick him up. Eddie, states she is the person to contact (and providing transport) once patient is medically stable for discharge.   TOC will continue to follow and assist with discharge planning.  Thurmond Butts, MSW, LCSW Clinical Social Worker   Expected Discharge Plan: Home/Self Care Barriers to Discharge: Continued Medical Work up  Expected Discharge Plan and Services Expected Discharge Plan: Home/Self Care In-house Referral: Clinical Social Work                                             Social Determinants of Health (SDOH) Interventions    Readmission Risk Interventions Readmission Risk Prevention Plan 07/24/2020 10/27/2018 10/25/2018  Transportation Screening Complete Complete Complete  PCP or Specialist Appt within 3-5 Days - Complete -  Home Care Screening Complete - -  Medication Review (RN CM) Complete - -  HRI or [place] - Complete -  Social Work Consult for [place] Planning/Counseling - Not Complete -  Palliative Care Screening - Not Applicable Not Applicable  Medication Review (RN Care Manager) - Complete -  Some recent data might be hidden

## 2020-09-22 ENCOUNTER — Inpatient Hospital Stay (HOSPITAL_COMMUNITY): Payer: Medicare HMO

## 2020-09-22 DIAGNOSIS — E1165 Type 2 diabetes mellitus with hyperglycemia: Secondary | ICD-10-CM | POA: Diagnosis not present

## 2020-09-22 DIAGNOSIS — F310 Bipolar disorder, current episode hypomanic: Secondary | ICD-10-CM | POA: Diagnosis not present

## 2020-09-22 DIAGNOSIS — Z789 Other specified health status: Secondary | ICD-10-CM | POA: Diagnosis not present

## 2020-09-22 DIAGNOSIS — E118 Type 2 diabetes mellitus with unspecified complications: Secondary | ICD-10-CM | POA: Diagnosis not present

## 2020-09-22 LAB — GLUCOSE, CAPILLARY
Glucose-Capillary: 104 mg/dL — ABNORMAL HIGH (ref 70–99)
Glucose-Capillary: 115 mg/dL — ABNORMAL HIGH (ref 70–99)
Glucose-Capillary: 119 mg/dL — ABNORMAL HIGH (ref 70–99)
Glucose-Capillary: 137 mg/dL — ABNORMAL HIGH (ref 70–99)
Glucose-Capillary: 175 mg/dL — ABNORMAL HIGH (ref 70–99)
Glucose-Capillary: 78 mg/dL (ref 70–99)

## 2020-09-22 LAB — RESP PANEL BY RT-PCR (FLU A&B, COVID) ARPGX2
Influenza A by PCR: NEGATIVE
Influenza B by PCR: NEGATIVE
SARS Coronavirus 2 by RT PCR: NEGATIVE

## 2020-09-22 MED ORDER — HYDROCODONE-ACETAMINOPHEN 5-325 MG PO TABS
1.0000 | ORAL_TABLET | Freq: Once | ORAL | Status: DC
  Filled 2020-09-22 (×2): qty 1

## 2020-09-22 NOTE — Plan of Care (Signed)

## 2020-09-22 NOTE — Progress Notes (Signed)
Patient very slow with any activity; takes 2 people to get out of bed, from chair to bed, or with any physical activity.  Patient takes a lot of time and multiple cues and guidance for simple tasks.  MD aware.

## 2020-09-22 NOTE — Progress Notes (Signed)
[facility] NEPHROLOGY PROGRESS NOTE  Assessment/ Plan: Pt is a 50 y.o. yo male  with history of HTN, CHF, bipolar disorder, ESRD on HD MWF at [facility] was brought into the ER for confusion and acting violently towards family, had issue with AV access for the dialysis seen as a consultation to manage ESRD.  [facility]: [facility] EDW127kg[facility] Bath 3K/2.5CaTime 4:15Heparin1000unit bolus then 600 units/hr. AccessLUE AVGBFR 400DFR 600  Hectoral 45mg IV/HD Epogen1000Units IV/HD Venofer 50 mg IV weekly  #Clotted/thrombosed left upper extremity AV graft: Status post declot of AV graft by IR on 2/9, access working well.  # ESRD: MWF at [facility]: Status post HD on 2/11 with 3 L UF, tolerated well.  Plan for next HD on Monday 1/14.    # Hypertension: Increased amlodipine to 10 mg on 2/11, monitor BP.  Continue current medication.  UF during dialysis.  # Anemia of ESRD: Hemoglobin at goal.  Continue weekly Aranesp.  # Metabolic Bone Disease:  Calcium and phosphorus level acceptable.  #Hypokalemia: 3K bath as outpatient, using 4K bath.  #Acute metabolic encephalopathy, suicidal and homicidal attempt with history of bipolar mood disorder: Seen by neurology.  Currently admitted under IVC.  Subjective: Seen and examined.  No new event.  No nausea, vomiting, chest pain or shortness of breath.    Objective Vital signs in last 24 hours: Vitals:   09/22/20 0015 09/22/20 0405 09/22/20 0451 09/22/20 0719  BP: 119/69 (!) 91/54 111/75 (!) 149/80  Pulse: 81 78 80 85  Resp: '20 17 19 15  '$ Temp: 98.1 F (36.7 C) 98.6 F (37 C)  98.7 F (37.1 C)  TempSrc: Axillary Axillary  Oral  SpO2: 100% 98% 98% 100%  Weight:      Height:       Weight change:   Intake/Output Summary (Last 24 hours) at 09/22/2020 1058 Last data filed at 09/21/2020 1805 Gross per 24 hour  Intake 460 ml  Output -  Net 460 ml       Labs: Basic Metabolic  Panel: Recent Labs  Lab 09/19/20 0038 09/20/20 0235 09/21/20 0353  NA 139 138 135  K 3.5 4.1 4.2  CL 101 100 98  CO2 22 22 17*  GLUCOSE 93 81 69*  BUN 63* 48* 64*  CREATININE 10.31* 8.08* 9.78*  CALCIUM 8.4* 8.8* 9.0  PHOS  --  4.2  --    Liver Function Tests: Recent Labs  Lab 09/17/20 1639  AST 15  ALT 16  ALKPHOS 40  BILITOT 0.7  PROT 6.9  ALBUMIN 3.7   No results for input(s): LIPASE, AMYLASE in the last 168 hours. No results for input(s): AMMONIA in the last 168 hours. CBC: Recent Labs  Lab 09/17/20 1639 09/18/20 0317 09/19/20 0038 09/20/20 0235 09/21/20 0353  WBC 5.8 6.7 5.5 7.4 7.9  NEUTROABS 3.8  --   --   --   --   HGB 9.8* 10.9* 10.2* 11.0* 10.3*  HCT 28.3* 32.3* 29.9* 31.4* 30.8*  MCV 100.4* 99.7 101.0* 100.6* 102.3*  PLT 91* 105* 101* 122* 104*   Cardiac Enzymes: No results for input(s): CKTOTAL, CKMB, CKMBINDEX, TROPONINI in the last 168 hours. CBG: Recent Labs  Lab 09/21/20 1651 09/21/20 2049 09/22/20 0014 09/22/20 0404 09/22/20 0737  GLUCAP 111* 140* 175* 104* 78    Iron Studies: No results for input(s): IRON, TIBC, TRANSFERRIN, FERRITIN in the last 72 hours. Studies/Results: EEG  Result Date: 09/20/2020 YLora Havens MD     09/20/2020  2:01 PM Patient Name: Timothy Clark MRN: [id number] Epilepsy Attending: Lora Clark Referring Physician/Provider: Dr Kerney Elbe Date: 09/20/2020 Duration: 26.28 mins Patient history:  50 year old male presented acutely encephalopathic. EEG to evaluate for seizure. Level of alertness: Awake AEDs during EEG study: VPA Technical aspects: This EEG study was done with scalp electrodes positioned according to the 10-20 International system of electrode placement. Electrical activity was acquired at a sampling rate of '500Hz'$  and reviewed with a high frequency filter of '70Hz'$  and a low frequency filter of '1Hz'$ . EEG data were recorded continuously and digitally stored. Description: The posterior dominant  rhythm consists of 8 Hz activity of moderate voltage (25-35 uV) seen predominantly in posterior head regions, symmetric and reactive to eye opening and eye closing. Hyperventilation and photic stimulation were not performed.   Patient was noted to have chewing movements during study, was able to stop and answer when asked. Concomitant eeg before, during and after the events didn't show any eeg change to suggest seizure. IMPRESSION: This study is within normal limits. No seizures or epileptiform discharges were seen throughout the recording. Chewing movements were noted without concomitant eeg change and were NOT epileptic. Priyanka Barbra Sarks   CT ABDOMEN PELVIS WO CONTRAST  Result Date: 09/20/2020 CLINICAL DATA:  50 year old male with abdominal pain. EXAM: CT ABDOMEN AND PELVIS WITHOUT CONTRAST TECHNIQUE: Multidetector CT imaging of the abdomen and pelvis was performed following the standard protocol without IV contrast. COMPARISON:  CT abdomen pelvis dated 07/23/2020. FINDINGS: Evaluation of this exam is limited in the absence of intravenous contrast. Lower chest: Minimal bibasilar atelectasis. The visualized lung bases are otherwise clear. No intra-abdominal free air or free fluid. Hepatobiliary: The liver is unremarkable. No intrahepatic biliary dilatation. Probable vicarious excretion of recently administered contrast within the gallbladder. No pericholecystic fluid. Pancreas: Unremarkable. No pancreatic ductal dilatation or surrounding inflammatory changes. Spleen: Normal in size without focal abnormality. Adrenals/Urinary Tract: The adrenal glands unremarkable. Moderate bilateral renal parenchyma atrophy. A 9 mm exophytic lesion from the posterior upper pole of the right kidney (81/6) appears similar to prior CT and not characterized on the CT. This can be better evaluated with ultrasound. There is no hydronephrosis or nephrolithiasis on either side. The visualized ureters appear unremarkable. High  attenuating content within the urinary bladder, likely excreted contrast from recent IV administration. Stomach/Bowel: There is no bowel obstruction or active inflammation. The appendix is normal. Vascular/Lymphatic: Mild atherosclerotic calcification. The abdominal aorta and IVC are otherwise unremarkable. No portal venous gas. There is no adenopathy. Reproductive: The prostate and seminal vesicles are grossly unremarkable no pelvic mass. Other: None Musculoskeletal: Degenerative changes of the spine. No acute osseous pathology. IMPRESSION: 1. No acute intra-abdominal or pelvic pathology. No bowel obstruction. Normal appendix. 2. Moderate bilateral renal parenchyma atrophy. 3. Aortic Atherosclerosis (ICD10-I70.0). Electronically Signed   By: Anner Crete M.D.   On: 09/20/2020 18:22    Medications: Infusions:   Scheduled Medications: . amLODipine  10 mg Oral Daily  . atorvastatin  10 mg Oral QHS  . Chlorhexidine Gluconate Cloth  6 each Topical Q0600  . darbepoetin (ARANESP) injection - DIALYSIS  60 mcg Intravenous Q Wed-HD  . dorzolamide-timolol  1 drop Both Eyes BID  . folic acid  1 mg Oral Daily  . heparin injection (subcutaneous)  5,000 Units Subcutaneous Q8H  . HYDROcodone-acetaminophen  1 tablet Oral Once  . insulin aspart  0-15 Units Subcutaneous Q4H  . metoCLOPramide (REGLAN) injection  5 mg Intravenous TID AC  . pantoprazole  40 mg Oral BID AC  . valproic acid  750 mg Oral TID  . ziprasidone  60 mg Oral BID WC    have reviewed scheduled and prn medications.  Physical Exam: General:NAD, resting comfortably Heart:RRR, s1s2 nl Lungs: Clear b/l, no crackle Abdomen:soft, Non-tender, non-distended Extremities:No LE edema Dialysis Access: AV graft has good thrill and bruit.  Timothy Clark Timothy Clark 09/22/2020,10:58 AM  LOS: 5 days

## 2020-09-22 NOTE — Progress Notes (Signed)
Patient states he has 8/10 pain in L AV fistula and numbness in L wrist.  Bruit palpable and thrill present.  Only PRN is tylenol, given at 0212. Hospitalist on call paged at 0223, received order for one time dose of Norco/vicodin.  When entering patient's room at 0250, patient states his pain is now 0/10 at the fistula site and that the numbness in the wrist is gone.  Dose of Norco/Vicodin not given due to lack of pain.  Will continue to monitor patient's pain.

## 2020-09-22 NOTE — Progress Notes (Signed)
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  --  2.0  --   --   --   PHOS  --   --   --  4.2  --    GFR: Estimated Creatinine Clearance: 12.2 mL/min (A) (by C-G formula based on SCr of 9.78 mg/dL (H)). Liver Function Tests: Recent Labs  Lab 09/17/20 1639  AST 15  ALT 16  ALKPHOS 40  BILITOT 0.7  PROT 6.9  ALBUMIN 3.7   No results for input(s): LIPASE, AMYLASE in the last 168 hours. No results for input(s): AMMONIA in the last 168 hours. Coagulation Profile: No results for input(s): INR, PROTIME in the last 168 hours. Cardiac Enzymes: No results for input(s): CKTOTAL, CKMB, CKMBINDEX, TROPONINI in the last 168 hours. BNP (last 3 results) No results for input(s): PROBNP in the last 8760 hours. HbA1C: No results for input(s): HGBA1C in the last 72 hours. CBG: Recent Labs  Lab  09/21/20 2049 09/22/20 0014 09/22/20 0404 09/22/20 0737 09/22/20 1147  GLUCAP 140* 175* 104* 78 115*   Lipid Profile: No results for input(s): CHOL, HDL, LDLCALC, TRIG, CHOLHDL, LDLDIRECT in the last 72 hours. Thyroid Function Tests: No results for input(s): TSH, T4TOTAL, FREET4, T3FREE, THYROIDAB in the last 72 hours. Anemia Panel: No results for input(s): VITAMINB12, FOLATE, FERRITIN, TIBC, IRON, RETICCTPCT  in the last 72 hours. Urine analysis:    Component Value Date/Time   COLORURINE YELLOW 10/24/2018 1424   APPEARANCEUR CLEAR 10/24/2018 1424   LABSPEC 1.014 10/24/2018 1424   PHURINE 5.0 10/24/2018 1424   GLUCOSEU >=500 (A) 10/24/2018 1424   HGBUR MODERATE (A) 10/24/2018 1424   BILIRUBINUR NEGATIVE 10/24/2018 1424   KETONESUR NEGATIVE 10/24/2018 1424   PROTEINUR >=300 (A) 10/24/2018 1424   UROBILINOGEN 0.2 06/07/2015 1353   NITRITE NEGATIVE 10/24/2018 1424   LEUKOCYTESUR NEGATIVE 10/24/2018 1424     Jazalyn Mondor M.D. [facility] 09/22/2020, 2:27 PM   Call night coverage person covering after 7pm

## 2020-09-23 DIAGNOSIS — F310 Bipolar disorder, current episode hypomanic: Secondary | ICD-10-CM | POA: Diagnosis not present

## 2020-09-23 DIAGNOSIS — R0600 Dyspnea, unspecified: Secondary | ICD-10-CM

## 2020-09-23 DIAGNOSIS — Z789 Other specified health status: Secondary | ICD-10-CM | POA: Diagnosis not present

## 2020-09-23 DIAGNOSIS — E118 Type 2 diabetes mellitus with unspecified complications: Secondary | ICD-10-CM | POA: Diagnosis not present

## 2020-09-23 LAB — GLUCOSE, CAPILLARY
Glucose-Capillary: 106 mg/dL — ABNORMAL HIGH (ref 70–99)
Glucose-Capillary: 107 mg/dL — ABNORMAL HIGH (ref 70–99)
Glucose-Capillary: 138 mg/dL — ABNORMAL HIGH (ref 70–99)
Glucose-Capillary: 95 mg/dL (ref 70–99)
Glucose-Capillary: 95 mg/dL (ref 70–99)
Glucose-Capillary: 95 mg/dL (ref 70–99)

## 2020-09-23 MED ORDER — METOCLOPRAMIDE HCL 5 MG PO TABS
5.0000 mg | ORAL_TABLET | Freq: Three times a day (TID) | ORAL | Status: DC
  Administered 2020-09-23 – 2020-10-13 (×52): 5 mg via ORAL
  Filled 2020-09-23 (×51): qty 1

## 2020-09-23 MED ORDER — CHLORHEXIDINE GLUCONATE CLOTH 2 % EX PADS
6.0000 | MEDICATED_PAD | Freq: Every day | CUTANEOUS | Status: DC
  Administered 2020-09-23 – 2020-09-26 (×4): 6 via TOPICAL

## 2020-09-23 MED ORDER — ZIPRASIDONE HCL 40 MG PO CAPS
40.0000 mg | ORAL_CAPSULE | Freq: Two times a day (BID) | ORAL | Status: DC
  Administered 2020-09-23 – 2020-09-25 (×5): 40 mg via ORAL
  Filled 2020-09-23 (×6): qty 1

## 2020-09-23 MED ORDER — METOCLOPRAMIDE HCL 5 MG/ML IJ SOLN
5.0000 mg | Freq: Once | INTRAMUSCULAR | Status: AC
  Administered 2020-09-23: 5 mg via INTRAVENOUS
  Filled 2020-09-23: qty 2

## 2020-09-23 MED ORDER — TRAMADOL HCL 50 MG PO TABS
50.0000 mg | ORAL_TABLET | Freq: Once | ORAL | Status: AC
  Administered 2020-09-23: 50 mg via ORAL
  Filled 2020-09-23: qty 1

## 2020-09-23 MED ORDER — SUCRALFATE 1 G PO TABS
1.0000 g | ORAL_TABLET | Freq: Four times a day (QID) | ORAL | Status: DC
  Administered 2020-09-23 – 2020-10-13 (×69): 1 g via ORAL
  Filled 2020-09-23 (×88): qty 1

## 2020-09-23 NOTE — Consult Note (Addendum)
Reason for Consult: ''reevaluate patient, patient is alert but with flat affect and slow/somnolent. Not likely due to Depakote per neuro however he is on Geodon also''   50 year old male with history of DM type 2 with gastroparesis, hypertension, ESRD on HD MWF, diastolic CHF, bipolar disorder was brought to the hospital due to agitation, confusion and  acting violently towards his family. Patient was evaluated by psychiatric service who continues him on Geodon 60 mg BID for Bipolar disorder. However, patient was also evaluated by Neurology service who replaced his Keppra with 750 mg of Depakote DR 3 x daily. Per treating physician(Hospitalist), patient is now slow/somnolent and Neuro service says it not likely due to Depakote.  Diagnosis: Bipolar 1 Disorder, most recent episode hypomania  Recommendation discussed with Rai Ripudeep,MD: Will decrease Geodon from 60 mg twice daily to 40 mg twice daily due to patient being somnolent.  Corena Pilgrim, MD Attending psychiatrist.

## 2020-09-23 NOTE — Progress Notes (Signed)
[facility] NEPHROLOGY PROGRESS NOTE  Assessment/ Plan: Pt is a 50 y.o. yo male  with history of HTN, CHF, bipolar disorder, ESRD on HD MWF at [facility] was brought into the ER for confusion and acting violently towards family, had issue with AV access for the dialysis seen as a consultation to manage ESRD.  [facility]: [facility] EDW127[facility] Bath 3K/2.5CaTime 4:15Heparin1000unit bolus then 600 units/hr. AccessLUE AVGBFR 400DFR 600  Hectoral 45mg IV/HD Epogen1000Units IV/HD Venofer 50 mg IV weekly  #Clotted/thrombosed left upper extremity AV graft: Status post declot of AV graft by IR on 2/9, access working well.  # ESRD: MWF at [facility]: Status post HD on 2/11 with 3 L UF, tolerated well.  Plan for next HD on Monday 1/14.  Clinically stable today.  # Hypertension: Increased amlodipine to 10 mg on 2/11, monitor BP.  Continue current medication.  UF during dialysis.  # Anemia of ESRD: Hemoglobin at goal.  Continue weekly Aranesp.  # Metabolic Bone Disease:  Calcium and phosphorus level acceptable.  #Hypokalemia: 3K bath as outpatient.  Potassium level acceptable.  #Acute metabolic encephalopathy, suicidal and homicidal attempt with history of bipolar mood disorder: Seen by neurology.  Per primary team.  Subjective: Seen and examined.  Resting comfortably.  Denies nausea vomiting chest pain shortness of breath.  No new event. Objective Vital signs in last 24 hours: Vitals:   09/22/20 2025 09/22/20 2341 09/23/20 0422 09/23/20 0709  BP: 132/75 105/66 124/66 (!) 147/72  Pulse: 82 79 87 89  Resp: '15 17 16 16  '$ Temp: 99 F (37.2 C) 98.9 F (37.2 C) 98.5 F (36.9 C) 98.7 F (37.1 C)  TempSrc: Axillary Axillary Oral Oral  SpO2: 97% 96% 98% 99%  Weight:      Height:       Weight change:   Intake/Output Summary (Last 24 hours) at 09/23/2020 1011 Last data filed at 09/23/2020 0600 Gross per 24 hour  Intake 420 ml  Output 375  ml  Net 45 ml       Labs: Basic Metabolic Panel: Recent Labs  Lab 09/19/20 0038 09/20/20 0235 09/21/20 0353  NA 139 138 135  K 3.5 4.1 4.2  CL 101 100 98  CO2 22 22 17*  GLUCOSE 93 81 69*  BUN 63* 48* 64*  CREATININE 10.31* 8.08* 9.78*  CALCIUM 8.4* 8.8* 9.0  PHOS  --  4.2  --    Liver Function Tests: Recent Labs  Lab 09/17/20 1639  AST 15  ALT 16  ALKPHOS 40  BILITOT 0.7  PROT 6.9  ALBUMIN 3.7   No results for input(s): LIPASE, AMYLASE in the last 168 hours. No results for input(s): AMMONIA in the last 168 hours. CBC: Recent Labs  Lab 09/17/20 1639 09/18/20 0317 09/19/20 0038 09/20/20 0235 09/21/20 0353  WBC 5.8 6.7 5.5 7.4 7.9  NEUTROABS 3.8  --   --   --   --   HGB 9.8* 10.9* 10.2* 11.0* 10.3*  HCT 28.3* 32.3* 29.9* 31.4* 30.8*  MCV 100.4* 99.7 101.0* 100.6* 102.3*  PLT 91* 105* 101* 122* 104*   Cardiac Enzymes: No results for input(s): CKTOTAL, CKMB, CKMBINDEX, TROPONINI in the last 168 hours. CBG: Recent Labs  Lab 09/22/20 1522 09/22/20 2028 09/23/20 0002 09/23/20 0421 09/23/20 0741  GLUCAP 119* 137* 106* 95 95    Iron Studies: No results for input(s): IRON, TIBC, TRANSFERRIN, FERRITIN in the last 72 hours. Studies/Results: DG CHEST PORT 1 VIEW  Result Date: 09/22/2020 CLINICAL  DATA:  Unresponsive.  Dyspnea. EXAM: PORTABLE CHEST 1 VIEW COMPARISON:  09/17/2020 FINDINGS: Cardiac silhouette normal in size. No mediastinal or hilar masses. Clear lungs. No convincing pleural effusion and no pneumothorax. Skeletal structures are grossly intact. IMPRESSION: No active disease. Electronically Signed   By: Lajean Manes M.D.   On: 09/22/2020 15:34    Medications: Infusions:   Scheduled Medications: . amLODipine  10 mg Oral Daily  . atorvastatin  10 mg Oral QHS  . Chlorhexidine Gluconate Cloth  6 each Topical Q0600  . darbepoetin (ARANESP) injection - DIALYSIS  60 mcg Intravenous Q Wed-HD  . dorzolamide-timolol  1 drop Both Eyes BID  . folic  acid  1 mg Oral Daily  . heparin injection (subcutaneous)  5,000 Units Subcutaneous Q8H  . HYDROcodone-acetaminophen  1 tablet Oral Once  . insulin aspart  0-15 Units Subcutaneous Q4H  . metoCLOPramide (REGLAN) injection  5 mg Intravenous TID AC  . pantoprazole  40 mg Oral BID AC  . sucralfate  1 g Oral QID  . valproic acid  750 mg Oral TID  . ziprasidone  60 mg Oral BID WC    have reviewed scheduled and prn medications.  Physical Exam: General:NAD, resting comfortably Heart:RRR, s1s2 nl Lungs: Clear b/l, no crackle Abdomen:soft, Non-tender, non-distended Extremities:No LE edema Dialysis Access: AV graft has good thrill and bruit.  Yashira Offenberger Prasad Charlesa Ehle 09/23/2020,10:11 AM  LOS: 6 days

## 2020-09-23 NOTE — Progress Notes (Signed)
0220- patient complaining of aching stomach pain, bowel sounds active in all 4 quadrants, abdomen soft, tylenol and miralax given  0310- pain reassessed, patient reports pain still in upper stomach, rates it 8/10, on call NP paged [phone]- order placed for reglan, given at 0328 0410- pain reassessed, patient reports pain still 8/10 aching, on call NP paged, received orders for Tramdol 0436- Tramdol given and patient repositioned 0615- Pain reassessed, patient said he felt relief of pain but that it was still 7/10, will pass onto day shift nurse to discuss with physician

## 2020-09-23 NOTE — Progress Notes (Signed)
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 28.3* 32.3* 29.9* 31.4* 30.8*  MCV 100.4* 99.7 101.0* 100.6* 102.3*  PLT 91* 105* 101* 122* [id number]*   Basic Metabolic Panel: Recent Labs  Lab 09/17/20 1639 09/18/20 0317 09/19/20 0038 09/20/20 0235 09/21/20 0353  NA 131* 136 139 138 135  K 3.1* 3.2* 3.5 4.1 4.2  CL 96* 96* 101 100 98  CO2 '23 23 22 22 '$ 17*  GLUCOSE 156* 134* 93 81 69*  BUN 67* 64* 63* 48* 64*  CREATININE 11.19* 10.51* 10.31* 8.08* 9.78*  CALCIUM 8.5* 8.8* 8.4* 8.8* 9.0  MG  --  2.0  --   --   --   PHOS  --   --   --  4.2  --    GFR: Estimated Creatinine Clearance: 12.2 mL/min (A) (by C-G formula based on SCr of 9.78 mg/dL (H)). Liver Function Tests: Recent Labs  Lab 09/17/20 1639  AST 15  ALT 16  ALKPHOS 40  BILITOT 0.7  PROT 6.9  ALBUMIN 3.7   No  results for input(s): LIPASE, AMYLASE in the last 168 hours. No results for input(s): AMMONIA in the last 168 hours. Coagulation Profile: No results for input(s): INR, PROTIME in the last 168 hours. Cardiac Enzymes: No results for input(s): CKTOTAL, CKMB, CKMBINDEX, TROPONINI in the last 168 hours. BNP (last 3 results) No results for input(s): PROBNP in the last  8760 hours. HbA1C: No results for input(s): HGBA1C in the last 72 hours. CBG: Recent Labs  Lab 09/22/20 2028 09/23/20 0002 09/23/20 0421 09/23/20 0741 09/23/20 1152  GLUCAP 137* 106* 95 95 107*   Lipid Profile: No results for input(s): CHOL, HDL, LDLCALC, TRIG, CHOLHDL, LDLDIRECT in the last 72 hours. Thyroid Function Tests: No results for input(s): TSH, T4TOTAL, FREET4, T3FREE, THYROIDAB in the last 72 hours. Anemia Panel: No results for input(s): VITAMINB12, FOLATE, FERRITIN, TIBC, IRON, RETICCTPCT in the last 72 hours. Urine analysis:    Component Value Date/Time   COLORURINE YELLOW 10/24/2018 1424   APPEARANCEUR CLEAR 10/24/2018 1424   LABSPEC 1.014 10/24/2018 1424   PHURINE 5.0 10/24/2018 1424   GLUCOSEU >=500 (A) 10/24/2018 1424   HGBUR MODERATE (A) 10/24/2018 1424   BILIRUBINUR NEGATIVE 10/24/2018 1424   KETONESUR NEGATIVE 10/24/2018 1424   PROTEINUR >=300 (A) 10/24/2018 1424   UROBILINOGEN 0.2 06/07/2015 1353   NITRITE NEGATIVE 10/24/2018 1424   LEUKOCYTESUR NEGATIVE 10/24/2018 1424     Kimberlin Scheel M.D. [facility] 09/23/2020, 12:12 PM   Call night coverage person covering after 7pm

## 2020-09-23 NOTE — Progress Notes (Signed)
Patient wanted to get up to chair to call a friend, required 2 person assist to stand and pivot.  Patient still slow with movement and requiring many cues to complete tasks simple tasks.

## 2020-09-24 DIAGNOSIS — Z789 Other specified health status: Secondary | ICD-10-CM | POA: Diagnosis not present

## 2020-09-24 DIAGNOSIS — F310 Bipolar disorder, current episode hypomanic: Secondary | ICD-10-CM | POA: Diagnosis not present

## 2020-09-24 LAB — GLUCOSE, CAPILLARY
Glucose-Capillary: 100 mg/dL — ABNORMAL HIGH (ref 70–99)
Glucose-Capillary: 110 mg/dL — ABNORMAL HIGH (ref 70–99)
Glucose-Capillary: 116 mg/dL — ABNORMAL HIGH (ref 70–99)
Glucose-Capillary: 133 mg/dL — ABNORMAL HIGH (ref 70–99)
Glucose-Capillary: 138 mg/dL — ABNORMAL HIGH (ref 70–99)
Glucose-Capillary: 76 mg/dL (ref 70–99)
Glucose-Capillary: 94 mg/dL (ref 70–99)

## 2020-09-24 LAB — HEPATITIS B E ANTIBODY: Hep B E Ab: NEGATIVE

## 2020-09-24 NOTE — Progress Notes (Signed)
[facility] ROUNDING NOTE   Subjective:   Interval History: This is a 50 year old gentleman with a history of diabetes and end-stage renal disease Monday Wednesday Friday dialysis [facility].  He also has a history of congestive heart failure with diastolic dysfunction.  He was brought to the emergency room with a change in behavior.  Violent activity towards his family members.  Next dialysis planned for 09/24/2020  Blood pressure 135/99 pulse 102 temperature 98.4 O2 sats 100%  Sodium 135 potassium 4.2 chloride 98 CO2 17 BUN 64 creatinine 9.78 glucose 69 calcium 9 hemoglobin 10.3  Objective:  Vital signs in last 24 hours:  Temp:  [98 F (36.7 C)-98.5 F (36.9 C)] 98.4 F (36.9 C) (02/14 0727) Pulse Rate:  [77-87] 87 (02/14 0727) Resp:  [12-15] 12 (02/14 0727) BP: (135-148)/(65-99) 135/99 (02/14 0727) SpO2:  [95 %-100 %] 100 % (02/14 0727)  Weight change:  Filed Weights   09/17/20 1338  Weight: 124.7 kg    Intake/Output: I/O last 3 completed shifts: In: -  Out: 925 [Urine:925]   Intake/Output this shift:  No intake/output data recorded.  General:NAD, resting comfortably Heart:RRR, s1s2 nl Lungs: Clear b/l, no crackle Abdomen:soft, Non-tender, non-distended Extremities:No LE edema Dialysis Access: AV graft has good thrill and bruit.   Basic Metabolic Panel: Recent Labs  Lab 09/17/20 1639 09/18/20 0317 09/19/20 0038 09/20/20 0235 09/21/20 0353  NA 131* 136 139 138 135  K 3.1* 3.2* 3.5 4.1 4.2  CL 96* 96* 101 100 98  CO2 '23 23 22 22 '$ 17*  GLUCOSE 156* 134* 93 81 69*  BUN 67* 64* 63* 48* 64*  CREATININE 11.19* 10.51* 10.31* 8.08* 9.78*  CALCIUM 8.5* 8.8* 8.4* 8.8* 9.0  MG  --  2.0  --   --   --   PHOS  --   --   --  4.2  --     Liver Function Tests: Recent Labs  Lab 09/17/20 1639  AST 15  ALT 16  ALKPHOS 40  BILITOT 0.7  PROT 6.9  ALBUMIN 3.7   No results for input(s): LIPASE, AMYLASE in the last 168 hours. No results for input(s):  AMMONIA in the last 168 hours.  CBC: Recent Labs  Lab 09/17/20 1639 09/18/20 0317 09/19/20 0038 09/20/20 0235 09/21/20 0353  WBC 5.8 6.7 5.5 7.4 7.9  NEUTROABS 3.8  --   --   --   --   HGB 9.8* 10.9* 10.2* 11.0* 10.3*  HCT 28.3* 32.3* 29.9* 31.4* 30.8*  MCV 100.4* 99.7 101.0* 100.6* 102.3*  PLT 91* 105* 101* 122* 104*    Cardiac Enzymes: No results for input(s): CKTOTAL, CKMB, CKMBINDEX, TROPONINI in the last 168 hours.  BNP: Invalid input(s): POCBNP  CBG: Recent Labs  Lab 09/23/20 1630 09/23/20 2111 09/24/20 0017 09/24/20 0343 09/24/20 0750  GLUCAP 138* 95 133* 76 100*    Microbiology: Results for orders placed or performed during the hospital encounter of 09/17/20  SARS Coronavirus 2 by RT PCR (hospital order, performed in [facility] hospital lab) Nasopharyngeal Nasopharyngeal Swab     Status: None   Collection Time: 09/17/20  2:13 PM   Specimen: Nasopharyngeal Swab  Result Value Ref Range Status   SARS Coronavirus 2 NEGATIVE NEGATIVE Final    Comment: (NOTE) SARS-CoV-2 target nucleic acids are NOT DETECTED.  The SARS-CoV-2 RNA is generally detectable in upper and lower respiratory specimens during the acute phase of infection. The lowest concentration of SARS-CoV-2 viral copies this assay can detect is 250  copies / mL. A negative result does not preclude SARS-CoV-2 infection and should not be used as the sole basis for treatment or other patient management decisions.  A negative result may occur with improper specimen collection / handling, submission of specimen other than nasopharyngeal swab, presence of viral mutation(s) within the areas targeted by this assay, and inadequate number of viral copies (<250 copies / mL). A negative result must be combined with clinical observations, patient history, and epidemiological information.  Fact Sheet for Patients:   StrictlyIdeas.no  Fact Sheet for Healthcare  Providers: BankingDealers.co.za  This test is not yet approved or  cleared by the [place] FDA and has been authorized for detection and/or diagnosis of SARS-CoV-2 by FDA under an Emergency Use Authorization (EUA).  This EUA will remain in effect (meaning this test can be used) for the duration of the COVID-19 declaration under Section 564(b)(1) of the Act, 21 U.S.C. section 360bbb-3(b)(1), unless the authorization is terminated or revoked sooner.  Performed at [facility], [address], [place], [place] [address]   MRSA PCR Screening     Status: None   Collection Time: 09/18/20  4:57 PM   Specimen: Nasal Mucosa; Nasopharyngeal  Result Value Ref Range Status   MRSA by PCR NEGATIVE NEGATIVE Final    Comment:        The GeneXpert MRSA Assay (FDA approved for NASAL specimens only), is one component of a comprehensive MRSA colonization surveillance program. It is not intended to diagnose MRSA infection nor to guide or monitor treatment for MRSA infections. Performed at [facility], [place] [address] [place], [place] [address]   Resp Panel by RT-PCR (Flu A&B, Covid) Nasopharyngeal Swab     Status: None   Collection Time: 09/22/20  8:41 AM   Specimen: Nasopharyngeal Swab; Nasopharyngeal(NP) swabs in vial transport medium  Result Value Ref Range Status   SARS Coronavirus 2 by RT PCR NEGATIVE NEGATIVE Final    Comment: (NOTE) SARS-CoV-2 target nucleic acids are NOT DETECTED.  The SARS-CoV-2 RNA is generally detectable in upper respiratory specimens during the acute phase of infection. The lowest concentration of SARS-CoV-2 viral copies this assay can detect is 138 copies/mL. A negative result does not preclude SARS-Cov-2 infection and should not be used as the sole basis for treatment or other patient management decisions. A negative result may occur with  improper specimen collection/handling, submission of specimen other than nasopharyngeal  swab, presence of viral mutation(s) within the areas targeted by this assay, and inadequate number of viral copies(<138 copies/mL). A negative result must be combined with clinical observations, patient history, and epidemiological information. The expected result is Negative.  Fact Sheet for Patients:  EntrepreneurPulse.com.au  Fact Sheet for Healthcare Providers:  IncredibleEmployment.be  This test is no t yet approved or cleared by the [place] FDA and  has been authorized for detection and/or diagnosis of SARS-CoV-2 by FDA under an Emergency Use Authorization (EUA). This EUA will remain  in effect (meaning this test can be used) for the duration of the COVID-19 declaration under Section 564(b)(1) of the Act, 21 U.S.C.section 360bbb-3(b)(1), unless the authorization is terminated  or revoked sooner.       Influenza A by PCR NEGATIVE NEGATIVE Final   Influenza B by PCR NEGATIVE NEGATIVE Final    Comment: (NOTE) The Xpert Xpress SARS-CoV-2/FLU/RSV plus assay is intended as an aid in the diagnosis of influenza from Nasopharyngeal swab specimens and should not be used as a sole basis for treatment.  Nasal washings and aspirates are unacceptable for Xpert Xpress SARS-CoV-2/FLU/RSV testing.  Fact Sheet for Patients: EntrepreneurPulse.com.au  Fact Sheet for Healthcare Providers: IncredibleEmployment.be  This test is not yet approved or cleared by the [place] FDA and has been authorized for detection and/or diagnosis of SARS-CoV-2 by FDA under an Emergency Use Authorization (EUA). This EUA will remain in effect (meaning this test can be used) for the duration of the COVID-19 declaration under Section 564(b)(1) of the Act, 21 U.S.C. section 360bbb-3(b)(1), unless the authorization is terminated or revoked.  Performed at [facility], [place] [address] [place], [place] [address]      Coagulation Studies: No results for input(s): LABPROT, INR in the last 72 hours.  Urinalysis: No results for input(s): COLORURINE, LABSPEC, PHURINE, GLUCOSEU, HGBUR, BILIRUBINUR, KETONESUR, PROTEINUR, UROBILINOGEN, NITRITE, LEUKOCYTESUR in the last 72 hours.  Invalid input(s): APPERANCEUR    Imaging: DG CHEST PORT 1 VIEW  Result Date: 09/22/2020 CLINICAL DATA:  Unresponsive.  Dyspnea. EXAM: PORTABLE CHEST 1 VIEW COMPARISON:  09/17/2020 FINDINGS: Cardiac silhouette normal in size. No mediastinal or hilar masses. Clear lungs. No convincing pleural effusion and no pneumothorax. Skeletal structures are grossly intact. IMPRESSION: No active disease. Electronically Signed   By: Lajean Manes M.D.   On: 09/22/2020 15:34     Medications:    . amLODipine  10 mg Oral Daily  . atorvastatin  10 mg Oral QHS  . Chlorhexidine Gluconate Cloth  6 each Topical Q0600  . Chlorhexidine Gluconate Cloth  6 each Topical Q0600  . darbepoetin (ARANESP) injection - DIALYSIS  60 mcg Intravenous Q Wed-HD  . dorzolamide-timolol  1 drop Both Eyes BID  . folic acid  1 mg Oral Daily  . heparin injection (subcutaneous)  5,000 Units Subcutaneous Q8H  . HYDROcodone-acetaminophen  1 tablet Oral Once  . insulin aspart  0-15 Units Subcutaneous Q4H  . metoCLOPramide  5 mg Oral TID AC  . pantoprazole  40 mg Oral BID AC  . sucralfate  1 g Oral QID  . valproic acid  750 mg Oral TID  . ziprasidone  40 mg Oral BID WC   acetaminophen **OR** acetaminophen, hydrALAZINE, labetalol, LORazepam, polyethylene glycol  Assessment/ Plan:  1.Clotted/thrombosed left upper extremity AV graft: Status post declot of AV graft by IR on 2/9, access working well.  2. ESRD:MWF at [facility]: Status post HD on 2/11 with 3 L UF, tolerated well.  Plan for next HD on Monday 1/14.  Clinically stable today.  3Hypertension: Increased amlodipine to 10 mg on 2/11, monitor BP.  Continue current medication.UF during dialysis.  4.Anemia of  ESRD:Hemoglobin at goal. Continue weekly Aranesp.  5. Metabolic Bone Disease: Calcium and phosphorus level acceptable.  6. Hypokalemia: 3K bath as outpatient.  Potassium level acceptable.  7. Acute metabolic encephalopathy, suicidal and homicidal attempt with history of bipolar mood disorder: Seen by neurology.  Per primary team.   LOS: Timothy Clark '@TODAY''@10'$ :51 AM

## 2020-09-24 NOTE — Progress Notes (Signed)
Physical Therapy Treatment Patient Details Name: Timothy Clark MRN: [id number] DOB: 1970-11-28 Today's Date: 09/24/2020    History of Present Illness The pt is a 50 yo male presenting with confusion, demonstrating self-harm and acting violently towards his family. Pt found to have acute metabolic encephalopathy. PMH includes: ESRD on HD MWF, seizures, HTN, DM II, CHF, depression, bipolar disorder.    PT Comments    The pt was able to progress with OOB mobility during today's session, but continues to require significant assist of 2 to complete short bouts of ambulation in the room with use of RW. The pt is able to generate power for sit-stand transfers, but is unable to eccentrically control lower without significant assist. Pt mobility is further complicated by chronic vision deficits and current cognitive deficits that impact safety awareness and problem solving. Pt requires cues for all mobility and sequencing to mobilize safely. The pt will continue to benefit from skilled PT to progress safety with mobility and decrease burden of care.     Follow Up Recommendations  SNF;Supervision/Assistance - 24 hour     Equipment Recommendations  Rolling walker with 5" wheels;Wheelchair (measurements PT);Wheelchair cushion (measurements PT);3in1 (PT) (if home today)    Recommendations for Other Services       Precautions / Restrictions Precautions Precautions: Fall Precaution Comments: pt is blind Restrictions Weight Bearing Restrictions: No    Mobility  Bed Mobility Overal bed mobility: Needs Assistance Bed Mobility: Supine to Sit     Supine to sit: Mod assist;HOB elevated     General bed mobility comments: MOD A for bed mobility with pt needing step by step cues to sequence task and significant assist to elevate trunk with pt reaching out for therapist    Transfers Overall transfer level: Needs assistance Equipment used: Rolling walker (2 wheeled) Transfers: Sit to/from  Stand Sit to Stand: Min assist;+2 physical assistance;From elevated surface         General transfer comment: MIN A +2 to rise from elevated EOB, pt required cues to bring hips into full extension as pt noted to lean back on bed in standing  Ambulation/Gait Ambulation/Gait assistance: Mod assist;Max assist;+2 physical assistance Gait Distance (Feet): 10 Feet (x2) Assistive device: Rolling walker (2 wheeled) Gait Pattern/deviations: Step-to pattern;Decreased stride length;Shuffle;Leaning posteriorly Gait velocity: decrease   General Gait Details: pt with small steps with minimal clearance cues for positioning in RW as well as upright posture due to strong posterior lean       Balance Overall balance assessment: Needs assistance Sitting-balance support: Feet supported;No upper extremity supported Sitting balance-Leahy Scale: Poor Sitting balance - Comments: slight posterior lean with static sitting balance, mim guard for safety   Standing balance support: Bilateral upper extremity supported Standing balance-Leahy Scale: Poor Standing balance comment: reliant on external support and BUE support on RW                            Cognition Arousal/Alertness: Awake/alert Behavior During Therapy: Flat affect (eyes closed during session) Overall Cognitive Status: Impaired/Different from baseline Area of Impairment: Attention;Following commands;Safety/judgement;Awareness;Problem solving                   Current Attention Level: Sustained   Following Commands: Follows one step commands with increased time Safety/Judgement: Decreased awareness of safety;Decreased awareness of deficits Awareness: Intellectual Problem Solving: Slow processing;Decreased initiation;Difficulty sequencing;Requires verbal cues;Requires tactile cues General Comments: pt continues to present with slow processing needing increased  time to follow commands, pt slow to respond and noted to keep  eyes closed during session. pt with impaired awareness likely d/t visual deficits but also noted to have poor problem solving skills noted when pt washing hands pt with no awareness to needing to supinate wrist to wash off excess soap, pt required cues to terimate ADL tasks      Exercises      General Comments General comments (skin integrity, edema, etc.): VSS      Pertinent Vitals/Pain Pain Assessment: No/denies pain           PT Goals (current goals can now be found in the care plan section) Acute Rehab PT Goals Patient Stated Goal: sit in chair PT Goal Formulation: With patient Time For Goal Achievement: 10/05/20 Potential to Achieve Goals: Good Progress towards PT goals: Progressing toward goals    Frequency    Min 2X/week      PT Plan Current plan remains appropriate    Co-evaluation PT/OT/SLP Co-Evaluation/Treatment: Yes Reason for Co-Treatment: Necessary to address cognition/behavior during functional activity;For patient/therapist safety;To address functional/ADL transfers PT goals addressed during session: Mobility/safety with mobility;Balance;Strengthening/ROM        AM-PAC PT "6 Clicks" Mobility   Outcome Measure  Help needed turning from your back to your side while in a flat bed without using bedrails?: A Little Help needed moving from lying on your back to sitting on the side of a flat bed without using bedrails?: A Lot Help needed moving to and from a bed to a chair (including a wheelchair)?: A Lot Help needed standing up from a chair using your arms (e.g., wheelchair or bedside chair)?: A Lot Help needed to walk in hospital room?: A Lot Help needed climbing 3-5 steps with a railing? : Total 6 Click Score: 12    End of Session Equipment Utilized During Treatment: Gait belt Activity Tolerance: Patient tolerated treatment well Patient left: in chair;with call bell/phone within reach;with chair alarm set Nurse Communication: Mobility status PT  Visit Diagnosis: Unsteadiness on feet (R26.81);Other abnormalities of gait and mobility (R26.89)     Time: [id number] PT Time Calculation (min) (ACUTE ONLY): 25 min  Charges:  $Gait Training: 8-22 mins                     Karma Ganja, PT, DPT   Acute Rehabilitation Department Pager #: [phone]   Otho Bellows 09/24/2020, 6:05 PM

## 2020-09-24 NOTE — Progress Notes (Signed)
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 105* 101* 122* [id number]*   Basic Metabolic Panel: Recent Labs  Lab 09/17/20 1639 09/18/20 0317 09/19/20 0038 09/20/20 0235 09/21/20 0353  NA 131* 136 139 138 135  K 3.1* 3.2* 3.5 4.1 4.2  CL 96* 96* 101 100 98  CO2 '23 23 22 22 '$ 17*  GLUCOSE 156* 134* 93 81 69*  BUN 67* 64* 63* 48* 64*  CREATININE 11.19* 10.51* 10.31* 8.08* 9.78*  CALCIUM 8.5* 8.8* 8.4* 8.8* 9.0  MG  --  2.0  --   --   --   PHOS  --   --   --  4.2  --    GFR: Estimated Creatinine Clearance: 12.2 mL/min (A) (by C-G formula based on SCr of 9.78 mg/dL (H)). Liver Function Tests: Recent Labs  Lab 09/17/20 1639  AST 15  ALT 16  ALKPHOS 40  BILITOT 0.7  PROT 6.9  ALBUMIN 3.7   No results for input(s): LIPASE, AMYLASE in the last 168 hours. No results for input(s): AMMONIA in the last 168  hours. Coagulation Profile: No results for input(s): INR, PROTIME in the last 168 hours. Cardiac Enzymes: No results for input(s): CKTOTAL, CKMB, CKMBINDEX, TROPONINI in the last 168 hours. BNP (last 3 results) No results for input(s): PROBNP in the last 8760 hours. HbA1C: No results for input(s): HGBA1C  in the last 72 hours. CBG: Recent Labs  Lab 09/23/20 2111 09/24/20 0017 09/24/20 0343 09/24/20 0750 09/24/20 1152  GLUCAP 95 133* 76 100* 138*   Lipid Profile: No results for input(s): CHOL, HDL, LDLCALC, TRIG, CHOLHDL, LDLDIRECT in the last 72 hours. Thyroid Function Tests: No results for input(s): TSH, T4TOTAL, FREET4, T3FREE, THYROIDAB in the last 72 hours. Anemia Panel: No results for input(s): VITAMINB12, FOLATE, FERRITIN, TIBC, IRON, RETICCTPCT in the last 72 hours. Urine analysis:    Component Value Date/Time   COLORURINE YELLOW 10/24/2018 1424   APPEARANCEUR CLEAR 10/24/2018 1424   LABSPEC 1.014 10/24/2018 1424   PHURINE 5.0 10/24/2018 1424   GLUCOSEU >=500 (A) 10/24/2018 1424   HGBUR MODERATE (A) 10/24/2018 1424   BILIRUBINUR NEGATIVE 10/24/2018 1424   KETONESUR NEGATIVE 10/24/2018 1424   PROTEINUR >=300 (A) 10/24/2018 1424   UROBILINOGEN 0.2 06/07/2015 1353   NITRITE NEGATIVE 10/24/2018 1424   LEUKOCYTESUR NEGATIVE 10/24/2018 1424     Ripudeep Rai M.D. [facility] 09/24/2020, 1:22 PM   Call night coverage person covering after 7pm
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
## 2020-09-24 NOTE — Plan of Care (Signed)
  Problem: Education: Goal: Knowledge of General Education information will improve Description: Including pain rating scale, medication(s)/side effects and non-pharmacologic comfort measures Outcome: Progressing   Problem: Health Behavior/Discharge Planning: Goal: Ability to manage health-related needs will improve Outcome: Progressing   Problem: Safety: Goal: Non-violent Restraint(s) Outcome: Not Applicable

## 2020-09-24 NOTE — TOC Progression Note (Signed)
Transition of Care [facility]) - Progression Note    Patient Details  Name: Timothy Clark MRN: [id number] Date of Birth: Jul 14, 1971  Transition of [facility]) CM/SW [place], [place] Phone Number: 09/24/2020, 2:40 PM  Clinical Narrative:     Patient has no bed offer- CSW has previously advised the family because of patient's  Behaviors(pscyh hx and needs) ,more than likely SNF will not be an option. Family was agreeable to patient returning home once medically stable. CSW has requested PT to continually work with patient-patient will d/c home.   CSW will continue to follow and assist with discharge planning. CSW will follow up with family.   Thurmond Butts, MSW, LCSW Clinical Social Worker   Expected Discharge Plan: Home/Self Care Barriers to Discharge: Continued Medical Work up  Expected Discharge Plan and Services Expected Discharge Plan: Home/Self Care In-house Referral: Clinical Social Work                                             Social Determinants of Health (SDOH) Interventions    Readmission Risk Interventions Readmission Risk Prevention Plan 07/24/2020 10/27/2018 10/25/2018  Transportation Screening Complete Complete Complete  PCP or Specialist Appt within 3-5 Days - Complete -  Home Care Screening Complete - -  Medication Review (RN CM) Complete - -  [place]RI or [place] - Complete -  Social Work Consult for [place] Planning/Counseling - Not Complete -  Palliative Care Screening - Not Applicable Not Applicable  Medication Review (RN Care Manager) - Complete -  Some recent data might be hidden

## 2020-09-25 ENCOUNTER — Encounter (INDEPENDENT_AMBULATORY_CARE_PROVIDER_SITE_OTHER): Payer: Medicare HMO | Admitting: Ophthalmology

## 2020-09-25 DIAGNOSIS — E118 Type 2 diabetes mellitus with unspecified complications: Secondary | ICD-10-CM | POA: Diagnosis not present

## 2020-09-25 DIAGNOSIS — F310 Bipolar disorder, current episode hypomanic: Secondary | ICD-10-CM | POA: Diagnosis not present

## 2020-09-25 DIAGNOSIS — R0600 Dyspnea, unspecified: Secondary | ICD-10-CM | POA: Diagnosis not present

## 2020-09-25 DIAGNOSIS — Z789 Other specified health status: Secondary | ICD-10-CM | POA: Diagnosis not present

## 2020-09-25 LAB — BASIC METABOLIC PANEL
Anion gap: 18 — ABNORMAL HIGH (ref 5–15)
BUN: 54 mg/dL — ABNORMAL HIGH (ref 6–20)
CO2: 24 mmol/L (ref 22–32)
Calcium: 9.4 mg/dL (ref 8.9–10.3)
Chloride: 94 mmol/L — ABNORMAL LOW (ref 98–111)
Creatinine, Ser: 8.51 mg/dL — ABNORMAL HIGH (ref 0.61–1.24)
GFR, Estimated: 7 mL/min — ABNORMAL LOW (ref >60)
Glucose, Bld: 153 mg/dL — ABNORMAL HIGH (ref 70–99)
Potassium: 3.3 mmol/L — ABNORMAL LOW (ref 3.5–5.1)
Sodium: 136 mmol/L (ref 135–145)

## 2020-09-25 LAB — GLUCOSE, CAPILLARY
Glucose-Capillary: 112 mg/dL — ABNORMAL HIGH (ref 70–99)
Glucose-Capillary: 127 mg/dL — ABNORMAL HIGH (ref 70–99)
Glucose-Capillary: 143 mg/dL — ABNORMAL HIGH (ref 70–99)
Glucose-Capillary: 131 mg/dL — ABNORMAL HIGH (ref 70–99)
Glucose-Capillary: 147 mg/dL — ABNORMAL HIGH (ref 70–99)
Glucose-Capillary: 161 mg/dL — ABNORMAL HIGH (ref 70–99)

## 2020-09-25 LAB — AMMONIA: Ammonia: 30 umol/L (ref 9–35)

## 2020-09-25 MED ORDER — CHLORHEXIDINE GLUCONATE CLOTH 2 % EX PADS
6.0000 | MEDICATED_PAD | Freq: Every day | CUTANEOUS | Status: DC
  Administered 2020-09-25 – 2020-09-27 (×3): 6 via TOPICAL

## 2020-09-25 MED ORDER — SORBITOL 70 % SOLN
30.0000 mL | Freq: Every day | Status: DC | PRN
  Administered 2020-09-25 – 2020-09-30 (×2): 30 mL via ORAL
  Filled 2020-09-25 (×3): qty 30

## 2020-09-25 NOTE — Progress Notes (Signed)
Patient back on floor from HD. Patient with increased Respiration rate at 36. Patient stated "I feel like Im having a panic attack"  RN tried relaxation techniques such as deep breathing, dim lights. No success. PRN ativan given for anxiety.

## 2020-09-25 NOTE — Care Management Important Message (Signed)
Important Message  Patient Details  Name: Timothy Clark MRN: [id number] Date of Birth: 09/19/1970   Medicare Important Message Given:  Yes     Orbie Pyo 09/25/2020, 1:26 PM

## 2020-09-25 NOTE — Progress Notes (Signed)
Patient has left to go to Hemodialysis.

## 2020-09-25 NOTE — Plan of Care (Signed)

## 2020-09-25 NOTE — Progress Notes (Signed)
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 Panel: Recent Labs  Lab 09/19/20 0038 09/20/20 0235 09/21/20 0353  NA 139 138 135  K 3.5 4.1 4.2  CL 101 100 98  CO2 22 22 17*  GLUCOSE 93 81 69*  BUN 63* 48* 64*  CREATININE 10.31* 8.08* 9.78*  CALCIUM 8.4* 8.8* 9.0  PHOS  --  4.2  --    GFR: Estimated Creatinine Clearance: 12.2 mL/min (A) (by C-G formula based on SCr of 9.78 mg/dL (H)). Liver Function Tests: No results for input(s): AST, ALT, ALKPHOS, BILITOT, PROT, ALBUMIN in the last 168 hours. No results for input(s): LIPASE, AMYLASE in the last 168 hours. No results for input(s): AMMONIA in the last 168 hours. Coagulation Profile: No results for input(s): INR, PROTIME in the last 168 hours. Cardiac Enzymes: No results for input(s): CKTOTAL, CKMB, CKMBINDEX, TROPONINI in the last 168  hours. BNP (last 3 results) No results for input(s): PROBNP in the last 8760 hours. HbA1C: No results for input(s): HGBA1C in the last 72 hours. CBG: Recent Labs  Lab 09/24/20 2037 09/24/20 2336 09/25/20 0522 09/25/20 0809 09/25/20 1111  GLUCAP 110* 94 112* 127* 143*   Lipid Profile: No results for input(s): CHOL, HDL, LDLCALC, TRIG, CHOLHDL, LDLDIRECT in the last 72 hours. Thyroid Function Tests: No results for input(s): TSH, T4TOTAL, FREET4, T3FREE, THYROIDAB in the last 72 hours. Anemia Panel: No results for input(s): VITAMINB12, FOLATE, FERRITIN, TIBC, IRON, RETICCTPCT in the  last 72 hours. Urine analysis:    Component Value Date/Time   COLORURINE YELLOW 10/24/2018 1424   APPEARANCEUR CLEAR 10/24/2018 1424   LABSPEC 1.014 10/24/2018 1424   PHURINE 5.0 10/24/2018 1424   GLUCOSEU >=500 (A) 10/24/2018 1424   HGBUR MODERATE (A) 10/24/2018 1424   BILIRUBINUR NEGATIVE 10/24/2018 1424   KETONESUR NEGATIVE 10/24/2018 1424   PROTEINUR >=300 (A) 10/24/2018 1424   UROBILINOGEN 0.2 06/07/2015 1353   NITRITE NEGATIVE 10/24/2018 1424   LEUKOCYTESUR NEGATIVE 10/24/2018 1424     Jennylee Uehara M.D. [facility] 09/25/2020, 2:11 PM   Call night coverage person covering after 7pm

## 2020-09-25 NOTE — Progress Notes (Signed)
[facility] ROUNDING NOTE   Subjective:   Interval History: This is a 50 year old gentleman with a history of diabetes and end-stage renal disease Monday Wednesday Friday dialysis [place].  He also has a history of congestive heart failure with diastolic dysfunction.  He was brought to the emergency room with a change in behavior.  Violent activity towards his family members.  Underwent dialysis 09/25/2020.  This was a late treatment and his next dialysis treatment be planned for 09/26/2020 he had 1.8 L of ultrafiltration  Blood pressure 137/78 pulse 91 temperature 98.8 O2 sats 90% room air  Sodium 135 potassium 4.2 chloride 98 CO2 17 BUN 64 creatinine 9.78 glucose 69 calcium 9 hemoglobin 10.3  Objective:  Vital signs in last 24 hours:  Temp:  [97.8 F (36.6 C)-98.8 F (37.1 C)] 98.8 F (37.1 C) (02/15 0448) Pulse Rate:  [71-101] 89 (02/15 0550) Resp:  [9-30] 16 (02/15 0550) BP: (114-179)/(68-141) 137/78 (02/15 0540) SpO2:  [96 %-100 %] 100 % (02/15 0550)  Weight change:  Filed Weights   09/17/20 1338  Weight: 124.7 kg    Intake/Output: I/O last 3 completed shifts: In: -  Out: 2670 [Urine:850; Other:1820]   Intake/Output this shift:  No intake/output data recorded.  General:NAD, resting comfortably Heart:RRR, s1s2 nl Lungs: Clear b/l, no crackle Abdomen:soft, Non-tender, non-distended Extremities:No LE edema Dialysis Access: AV graft has good thrill and bruit.   Basic Metabolic Panel: Recent Labs  Lab 09/19/20 0038 09/20/20 0235 09/21/20 0353  NA 139 138 135  K 3.5 4.1 4.2  CL 101 100 98  CO2 22 22 17*  GLUCOSE 93 81 69*  BUN 63* 48* 64*  CREATININE 10.31* 8.08* 9.78*  CALCIUM 8.4* 8.8* 9.0  PHOS  --  4.2  --     Liver Function Tests: No results for input(s): AST, ALT, ALKPHOS, BILITOT, PROT, ALBUMIN in the last 168 hours. No results for input(s): LIPASE, AMYLASE in the last 168 hours. No results for input(s): AMMONIA in the last 168  hours.  CBC: Recent Labs  Lab 09/19/20 0038 09/20/20 0235 09/21/20 0353  WBC 5.5 7.4 7.9  HGB 10.2* 11.0* 10.3*  HCT 29.9* 31.4* 30.8*  MCV 101.0* 100.6* 102.3*  PLT 101* 122* 104*    Cardiac Enzymes: No results for input(s): CKTOTAL, CKMB, CKMBINDEX, TROPONINI in the last 168 hours.  BNP: Invalid input(s): POCBNP  CBG: Recent Labs  Lab 09/24/20 1152 09/24/20 1559 09/24/20 2037 09/24/20 2336 09/25/20 0522  GLUCAP 138* 116* 110* 94 112*    Microbiology: Results for orders placed or performed during the hospital encounter of 09/17/20  SARS Coronavirus 2 by RT PCR (hospital order, performed in [facility] hospital lab) Nasopharyngeal Nasopharyngeal Swab     Status: None   Collection Time: 09/17/20  2:13 PM   Specimen: Nasopharyngeal Swab  Result Value Ref Range Status   SARS Coronavirus 2 NEGATIVE NEGATIVE Final    Comment: (NOTE) SARS-CoV-2 target nucleic acids are NOT DETECTED.  The SARS-CoV-2 RNA is generally detectable in upper and lower respiratory specimens during the acute phase of infection. The lowest concentration of SARS-CoV-2 viral copies this assay can detect is 250 copies / mL. A negative result does not preclude SARS-CoV-2 infection and should not be used as the sole basis for treatment or other patient management decisions.  A negative result may occur with improper specimen collection / handling, submission of specimen other than nasopharyngeal swab, presence of viral mutation(s) within the areas targeted by this assay, and inadequate  number of viral copies (<250 copies / mL). A negative result must be combined with clinical observations, patient history, and epidemiological information.  Fact Sheet for Patients:   StrictlyIdeas.no  Fact Sheet for Healthcare Providers: BankingDealers.co.za  This test is not yet approved or  cleared by the [place] FDA and has been authorized for detection  and/or diagnosis of SARS-CoV-2 by FDA under an Emergency Use Authorization (EUA).  This EUA will remain in effect (meaning this test can be used) for the duration of the COVID-19 declaration under Section 564(b)(1) of the Act, 21 U.S.C. section 360bbb-3(b)(1), unless the authorization is terminated or revoked sooner.  Performed at [facility], [address], [place], [place] [address]   MRSA PCR Screening     Status: None   Collection Time: 09/18/20  4:57 PM   Specimen: Nasal Mucosa; Nasopharyngeal  Result Value Ref Range Status   MRSA by PCR NEGATIVE NEGATIVE Final    Comment:        The GeneXpert MRSA Assay (FDA approved for NASAL specimens only), is one component of a comprehensive MRSA colonization surveillance program. It is not intended to diagnose MRSA infection nor to guide or monitor treatment for MRSA infections. Performed at [facility], [place] [address] [place], [place] [address]   Resp Panel by RT-PCR (Flu A&B, Covid) Nasopharyngeal Swab     Status: None   Collection Time: 09/22/20  8:41 AM   Specimen: Nasopharyngeal Swab; Nasopharyngeal(NP) swabs in vial transport medium  Result Value Ref Range Status   SARS Coronavirus 2 by RT PCR NEGATIVE NEGATIVE Final    Comment: (NOTE) SARS-CoV-2 target nucleic acids are NOT DETECTED.  The SARS-CoV-2 RNA is generally detectable in upper respiratory specimens during the acute phase of infection. The lowest concentration of SARS-CoV-2 viral copies this assay can detect is 138 copies/mL. A negative result does not preclude SARS-Cov-2 infection and should not be used as the sole basis for treatment or other patient management decisions. A negative result may occur with  improper specimen collection/handling, submission of specimen other than nasopharyngeal swab, presence of viral mutation(s) within the areas targeted by this assay, and inadequate number of viral copies(<138 copies/mL). A negative result must be  combined with clinical observations, patient history, and epidemiological information. The expected result is Negative.  Fact Sheet for Patients:  EntrepreneurPulse.com.au  Fact Sheet for Healthcare Providers:  IncredibleEmployment.be  This test is no t yet approved or cleared by the [place] FDA and  has been authorized for detection and/or diagnosis of SARS-CoV-2 by FDA under an Emergency Use Authorization (EUA). This EUA will remain  in effect (meaning this test can be used) for the duration of the COVID-19 declaration under Section 564(b)(1) of the Act, 21 U.S.C.section 360bbb-3(b)(1), unless the authorization is terminated  or revoked sooner.       Influenza A by PCR NEGATIVE NEGATIVE Final   Influenza B by PCR NEGATIVE NEGATIVE Final    Comment: (NOTE) The Xpert Xpress SARS-CoV-2/FLU/RSV plus assay is intended as an aid in the diagnosis of influenza from Nasopharyngeal swab specimens and should not be used as a sole basis for treatment. Nasal washings and aspirates are unacceptable for Xpert Xpress SARS-CoV-2/FLU/RSV testing.  Fact Sheet for Patients: EntrepreneurPulse.com.au  Fact Sheet for Healthcare Providers: IncredibleEmployment.be  This test is not yet approved or cleared by the [place] FDA and has been authorized for detection and/or diagnosis of SARS-CoV-2 by FDA under an Emergency Use Authorization (EUA). This EUA will  remain in effect (meaning this test can be used) for the duration of the COVID-19 declaration under Section 564(b)(1) of the Act, 21 U.S.C. section 360bbb-3(b)(1), unless the authorization is terminated or revoked.  Performed at [facility], [place] [address], [place], [place] [address]     Coagulation Studies: No results for input(s): LABPROT, INR in the last 72 hours.  Urinalysis: No results for input(s): COLORURINE, LABSPEC, PHURINE, GLUCOSEU,  HGBUR, BILIRUBINUR, KETONESUR, PROTEINUR, UROBILINOGEN, NITRITE, LEUKOCYTESUR in the last 72 hours.  Invalid input(s): APPERANCEUR    Imaging: No results found.   Medications:    . amLODipine  10 mg Oral Daily  . atorvastatin  10 mg Oral QHS  . Chlorhexidine Gluconate Cloth  6 each Topical Q0600  . Chlorhexidine Gluconate Cloth  6 each Topical Q0600  . darbepoetin (ARANESP) injection - DIALYSIS  60 mcg Intravenous Q Wed-HD  . dorzolamide-timolol  1 drop Both Eyes BID  . folic acid  1 mg Oral Daily  . heparin injection (subcutaneous)  5,000 Units Subcutaneous Q8H  . HYDROcodone-acetaminophen  1 tablet Oral Once  . insulin aspart  0-15 Units Subcutaneous Q4H  . metoCLOPramide  5 mg Oral TID AC  . pantoprazole  40 mg Oral BID AC  . sucralfate  1 g Oral QID  . valproic acid  750 mg Oral TID  . ziprasidone  40 mg Oral BID WC   acetaminophen **OR** acetaminophen, hydrALAZINE, labetalol, LORazepam, polyethylene glycol  Assessment/ Plan:  1.Clotted/thrombosed left upper extremity AV graft: Status post declot of AV graft by IR on 2/9, access working well.  2. ESRD:MWF at [facility]: Status post HD on 09/25/2020 1.8 L removed late treatment next dialysis will be 09/26/2020 .   Clinically stable today.  3Hypertension: Increased amlodipine to 10 mg on 2/11, monitor BP.  Continue current medication.UF during dialysis.  4.Anemia of ESRD:Hemoglobin at goal. Continue weekly Aranesp.  5. Metabolic Bone Disease: Calcium and phosphorus level acceptable.  6. Hypokalemia: 3K bath as outpatient.  Potassium level acceptable.  7. Acute metabolic encephalopathy, suicidal and homicidal attempt with history of bipolar mood disorder: Seen by neurology.  Per primary team.   LOS: [place] '@TODAY''@7'$ :03 AM

## 2020-09-25 NOTE — Consult Note (Signed)
50 year old male with history of DM type 2 with gastroparesis, hypertension, ESRD on HD MWF, diastolic CHF, bipolar disorder was brought to the hospital due to agitation, confusion and  acting violently towards his family. Patient was evaluated by psychiatric service who continues him on Geodon 60 mg BID for Bipolar disorder. However, patient was also evaluated by Neurology service who replaced his Keppra with 750 mg of Depakote DR 3 x daily. Per treating physician(Hospitalist), patient is now slow/somnolent and Neuro service says it not likely due to Depakote.  Received message from Dr. Tana Coast via secure chat to re-consult patient. Patient with acute onset of solemnence, flat affect and slow that began after Depakote. Patient was previously psych cleared as he did not meet inpatient criteria. He was assessed by neurology for seizure, and other causes of agitation, rage, and aggression. He was initiated on Depakote and Keppra was discontinued at the time. At current he is on Depakote '750mg'$  po TID, his last valproic acid level was 68, 02/09. Geodon was reduce '40mg'$  po BID on 02/13, with no improvement in symptoms. Attending is requesting review of medication. Will order valproic acid level for tomorrow. If level is therapeutic, will recommend reducing Geodon '20mg'$  po qam and Geodon '40mg'$  po qhs. There are no known adverse reactions or drug to drug interactions with Geodon and Depakote, however both can cause CNS depression. Depakote can cause sedation and somnolent at high doses, in addition to Depakote toxicity. Since Depakote was recently initiated it is most likely the offending agent to cause new cns depressant symptoms such as drowsiness, slow to respond, and worsening confusion.   -Depakote was initiated by neurology, will obtain valproic acid level to r/o Depakote toxicity and Depakote related encephalopathy. Will also order ammonia level.  -If levels are therapeutic, will recommend reducing Geodon '20mg'$  po qam  and Geodon '40mg'$  po qhs.  Also will need to get collateral from family to see how patient is at baseline.  -Awaiting new consult order to reassess patient.

## 2020-09-26 DIAGNOSIS — Z789 Other specified health status: Secondary | ICD-10-CM | POA: Diagnosis not present

## 2020-09-26 LAB — BASIC METABOLIC PANEL
Anion gap: 17 — ABNORMAL HIGH (ref 5–15)
BUN: 61 mg/dL — ABNORMAL HIGH (ref 6–20)
CO2: 24 mmol/L (ref 22–32)
Calcium: 9.4 mg/dL (ref 8.9–10.3)
Chloride: 94 mmol/L — ABNORMAL LOW (ref 98–111)
Creatinine, Ser: 8.92 mg/dL — ABNORMAL HIGH (ref 0.61–1.24)
GFR, Estimated: 7 mL/min — ABNORMAL LOW (ref >60)
Glucose, Bld: 109 mg/dL — ABNORMAL HIGH (ref 70–99)
Potassium: 2.9 mmol/L — ABNORMAL LOW (ref 3.5–5.1)
Sodium: 135 mmol/L (ref 135–145)

## 2020-09-26 LAB — CBC
HCT: 33.7 % — ABNORMAL LOW (ref 39.0–52.0)
HCT: 35.3 % — ABNORMAL LOW (ref 39.0–52.0)
Hemoglobin: 12.4 g/dL — ABNORMAL LOW (ref 13.0–17.0)
Hemoglobin: 12.7 g/dL — ABNORMAL LOW (ref 13.0–17.0)
MCH: 35.3 pg — ABNORMAL HIGH (ref 26.0–34.0)
MCH: 35.8 pg — ABNORMAL HIGH (ref 26.0–34.0)
MCHC: 36 g/dL (ref 30.0–36.0)
MCHC: 36.8 g/dL — ABNORMAL HIGH (ref 30.0–36.0)
MCV: 97.4 fL (ref 80.0–100.0)
MCV: 98.1 fL (ref 80.0–100.0)
Platelets: 133 10*3/uL — ABNORMAL LOW (ref 150–400)
Platelets: 143 10*3/uL — ABNORMAL LOW (ref 150–400)
RBC: 3.46 MIL/uL — ABNORMAL LOW (ref 4.22–5.81)
RBC: 3.6 MIL/uL — ABNORMAL LOW (ref 4.22–5.81)
RDW: 13.4 % (ref 11.5–15.5)
RDW: 13.5 % (ref 11.5–15.5)
WBC: 5.5 10*3/uL (ref 4.0–10.5)
WBC: 6 10*3/uL (ref 4.0–10.5)
nRBC: 0 % (ref 0.0–0.2)
nRBC: 0 % (ref 0.0–0.2)

## 2020-09-26 LAB — GLUCOSE, CAPILLARY
Glucose-Capillary: 98 mg/dL (ref 70–99)
Glucose-Capillary: 106 mg/dL — ABNORMAL HIGH (ref 70–99)
Glucose-Capillary: 121 mg/dL — ABNORMAL HIGH (ref 70–99)
Glucose-Capillary: 172 mg/dL — ABNORMAL HIGH (ref 70–99)
Glucose-Capillary: 197 mg/dL — ABNORMAL HIGH (ref 70–99)
Glucose-Capillary: 144 mg/dL — ABNORMAL HIGH (ref 70–99)

## 2020-09-26 LAB — VALPROIC ACID LEVEL: Valproic Acid Lvl: 77 ug/mL (ref 50.0–100.0)

## 2020-09-26 MED ORDER — ZIPRASIDONE HCL 20 MG PO CAPS
20.0000 mg | ORAL_CAPSULE | Freq: Every day | ORAL | Status: DC
Start: 2020-09-27 — End: 2020-09-26

## 2020-09-26 MED ORDER — ZIPRASIDONE HCL 40 MG PO CAPS: 40.0000 mg | ORAL_CAPSULE | Freq: Every day | ORAL | Status: DC

## 2020-09-26 MED ORDER — ZIPRASIDONE HCL 40 MG PO CAPS
40.0000 mg | ORAL_CAPSULE | Freq: Every evening | ORAL | Status: DC
  Administered 2020-09-26 – 2020-10-12 (×17): 40 mg via ORAL
  Filled 2020-09-26 (×21): qty 1

## 2020-09-26 MED ORDER — DARBEPOETIN ALFA 60 MCG/0.3ML IJ SOSY
PREFILLED_SYRINGE | INTRAMUSCULAR | Status: AC
  Administered 2020-09-26: 60 ug via INTRAVENOUS
  Filled 2020-09-26: qty 0.3

## 2020-09-26 MED ORDER — ZIPRASIDONE HCL 20 MG PO CAPS
20.0000 mg | ORAL_CAPSULE | Freq: Every day | ORAL | Status: DC
  Administered 2020-09-27 – 2020-10-13 (×15): 20 mg via ORAL
  Filled 2020-09-26 (×18): qty 1

## 2020-09-26 NOTE — Progress Notes (Signed)
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 6.0  HGB 11.0* 10.3* 12.7* 12.4*  HCT 31.4* 30.8* 35.3* 33.7*  MCV 100.6* 102.3* 98.1 97.4  PLT 122* 104* 133* [id number]*   Basic Metabolic Panel: Recent Labs  Lab 09/20/20 0235 09/21/20 0353 09/25/20 2215 09/26/20 0500  NA 138 135 136 135  K 4.1 4.2 3.3* 2.9*  CL 100 98 94* 94*  CO2 22 17* 24 24  GLUCOSE 81 69* 153* 109*  BUN 48* 64* 54* 61*  CREATININE 8.08* 9.78* 8.51* 8.92*  CALCIUM 8.8* 9.0 9.4 9.4  PHOS 4.2  --   --   --    GFR: Estimated Creatinine Clearance: 13.3 mL/min (A) (by C-G formula based on SCr of 8.92 mg/dL (H)). Liver Function Tests: No results for input(s): AST, ALT, ALKPHOS, BILITOT, PROT, ALBUMIN in the last 168 hours. No results for input(s): LIPASE, AMYLASE in the last 168 hours. Recent Labs  Lab 09/25/20 2215  AMMONIA 30   Coagulation  Profile: No results for input(s): INR, PROTIME in the last 168 hours. Cardiac Enzymes: No results for input(s): CKTOTAL, CKMB, CKMBINDEX, TROPONINI in the last 168 hours. BNP (last 3 results) No results for input(s): PROBNP in the last 8760 hours. HbA1C: No results for input(s): HGBA1C in the last 72 hours. CBG: Recent Labs  Lab 09/25/20 1626 09/25/20 1915 09/25/20 2310 09/26/20 0325 09/26/20 0731  GLUCAP 131* 147*  161* 98 106*   Lipid Profile: No results for input(s): CHOL, HDL, LDLCALC, TRIG, CHOLHDL, LDLDIRECT in the last 72 hours. Thyroid Function Tests: No results for input(s): TSH, T4TOTAL, FREET4, T3FREE, THYROIDAB in the last 72 hours. Anemia Panel: No results for input(s): VITAMINB12, FOLATE, FERRITIN, TIBC, IRON, RETICCTPCT in the last 72 hours. Urine analysis:    Component Value Date/Time   COLORURINE YELLOW 10/24/2018 1424   APPEARANCEUR CLEAR 10/24/2018 1424   LABSPEC 1.014 10/24/2018 1424   PHURINE 5.0 10/24/2018 1424   GLUCOSEU >=500 (A) 10/24/2018 1424   HGBUR MODERATE (A) 10/24/2018 1424   BILIRUBINUR NEGATIVE 10/24/2018 1424   KETONESUR NEGATIVE 10/24/2018 1424   PROTEINUR >=300 (A) 10/24/2018 1424   UROBILINOGEN 0.2 06/07/2015 1353   NITRITE NEGATIVE 10/24/2018 1424   LEUKOCYTESUR NEGATIVE 10/24/2018 [place] M.D. [facility] 09/26/2020, 11:07 AM   Call night coverage person covering after 7pm

## 2020-09-26 NOTE — Plan of Care (Signed)
  Problem: Activity: Goal: Risk for activity intolerance will decrease Outcome: Progressing   

## 2020-09-26 NOTE — Progress Notes (Signed)
[facility] ROUNDING NOTE   Subjective:   Interval History: This is a 50 year old gentleman with a history of diabetes and end-stage renal disease Monday Wednesday Friday dialysis [place].  He also has a history of congestive heart failure with diastolic dysfunction.  He was brought to the emergency room with a change in behavior.  Violent activity towards his family members.  Underwent dialysis 09/25/2020.  This was a late treatment and his next dialysis treatment be planned for 09/26/2020 he had 1.8 L of ultrafiltration  Blood pressure 117/62 pulse 82 temperature 98.5 O2 sats 99% room air  Sodium 136 potassium 3.3 chloride 95 CO2 24 BUN of 54 creatinine 8.41 glucose 153 calcium 9.4 ammonia 30 hemoglobin 12.4  Objective:  Vital signs in last 24 hours:  Temp:  [97.1 F (36.2 C)-99.1 F (37.3 C)] 98.5 F (36.9 C) (02/16 0755) Pulse Rate:  [76-92] 82 (02/16 0830) Resp:  [13-19] 18 (02/16 0830) BP: (93-145)/(53-85) 117/62 (02/16 0830) SpO2:  [98 %-100 %] 99 % (02/16 0830)  Weight change:  Filed Weights   09/17/20 1338  Weight: 124.7 kg    Intake/Output: I/O last 3 completed shifts: In: -  Out: 2720 [Urine:900; Other:1820]   Intake/Output this shift:  Total I/O In: 240 [P.O.:240] Out: -   General:NAD, resting comfortably Heart:RRR, s1s2 nl Lungs: Clear b/l, no crackle Abdomen:soft, Non-tender, non-distended Extremities:No LE edema Dialysis Access: AV graft has good thrill and bruit.   Basic Metabolic Panel: Recent Labs  Lab 09/20/20 0235 09/21/20 0353 09/25/20 2215  NA 138 135 136  K 4.1 4.2 3.3*  CL 100 98 94*  CO2 22 17* 24  GLUCOSE 81 69* 153*  BUN 48* 64* 54*  CREATININE 8.08* 9.78* 8.51*  CALCIUM 8.8* 9.0 9.4  PHOS 4.2  --   --     Liver Function Tests: No results for input(s): AST, ALT, ALKPHOS, BILITOT, PROT, ALBUMIN in the last 168 hours. No results for input(s): LIPASE, AMYLASE in the last 168 hours. Recent Labs  Lab 09/25/20 2215   AMMONIA 30    CBC: Recent Labs  Lab 09/20/20 0235 09/21/20 0353 09/25/20 2330 09/26/20 0500  WBC 7.4 7.9 5.5 6.0  HGB 11.0* 10.3* 12.7* 12.4*  HCT 31.4* 30.8* 35.3* 33.7*  MCV 100.6* 102.3* 98.1 97.4  PLT 122* 104* 133* 143*    Cardiac Enzymes: No results for input(s): CKTOTAL, CKMB, CKMBINDEX, TROPONINI in the last 168 hours.  BNP: Invalid input(s): POCBNP  CBG: Recent Labs  Lab 09/25/20 1626 09/25/20 1915 09/25/20 2310 09/26/20 0325 09/26/20 0731  GLUCAP 131* 147* 161* 60 106*    Microbiology: Results for orders placed or performed during the hospital encounter of 09/17/20  SARS Coronavirus 2 by RT PCR (hospital order, performed in [facility] hospital lab) Nasopharyngeal Nasopharyngeal Swab     Status: None   Collection Time: 09/17/20  2:13 PM   Specimen: Nasopharyngeal Swab  Result Value Ref Range Status   SARS Coronavirus 2 NEGATIVE NEGATIVE Final    Comment: (NOTE) SARS-CoV-2 target nucleic acids are NOT DETECTED.  The SARS-CoV-2 RNA is generally detectable in upper and lower respiratory specimens during the acute phase of infection. The lowest concentration of SARS-CoV-2 viral copies this assay can detect is 250 copies / mL. A negative result does not preclude SARS-CoV-2 infection and should not be used as the sole basis for treatment or other patient management decisions.  A negative result may occur with improper specimen collection / handling, submission of specimen other than  nasopharyngeal swab, presence of viral mutation(s) within the areas targeted by this assay, and inadequate number of viral copies (<250 copies / mL). A negative result must be combined with clinical observations, patient history, and epidemiological information.  Fact Sheet for Patients:   StrictlyIdeas.no  Fact Sheet for Healthcare Providers: BankingDealers.co.za  This test is not yet approved or  cleared by the [place] FDA and has been authorized for detection and/or diagnosis of SARS-CoV-2 by FDA under an Emergency Use Authorization (EUA).  This EUA will remain in effect (meaning this test can be used) for the duration of the COVID-19 declaration under Section 564(b)(1) of the Act, 21 U.S.C. section 360bbb-3(b)(1), unless the authorization is terminated or revoked sooner.  Performed at [facility], [address], [place], [place] [address]   MRSA PCR Screening     Status: None   Collection Time: 09/18/20  4:57 PM   Specimen: Nasal Mucosa; Nasopharyngeal  Result Value Ref Range Status   MRSA by PCR NEGATIVE NEGATIVE Final    Comment:        The GeneXpert MRSA Assay (FDA approved for NASAL specimens only), is one component of a comprehensive MRSA colonization surveillance program. It is not intended to diagnose MRSA infection nor to guide or monitor treatment for MRSA infections. Performed at [facility], [place] [address], [place], [place] [address]   Resp Panel by RT-PCR (Flu A&B, Covid) Nasopharyngeal Swab     Status: None   Collection Time: 09/22/20  8:41 AM   Specimen: Nasopharyngeal Swab; Nasopharyngeal(NP) swabs in vial transport medium  Result Value Ref Range Status   SARS Coronavirus 2 by RT PCR NEGATIVE NEGATIVE Final    Comment: (NOTE) SARS-CoV-2 target nucleic acids are NOT DETECTED.  The SARS-CoV-2 RNA is generally detectable in upper respiratory specimens during the acute phase of infection. The lowest concentration of SARS-CoV-2 viral copies this assay can detect is 138 copies/mL. A negative result does not preclude SARS-Cov-2 infection and should not be used as the sole basis for treatment or other patient management decisions. A negative result may occur with  improper specimen collection/handling, submission of specimen other than nasopharyngeal swab, presence of viral mutation(s) within the areas targeted by this assay, and inadequate number of  viral copies(<138 copies/mL). A negative result must be combined with clinical observations, patient history, and epidemiological information. The expected result is Negative.  Fact Sheet for Patients:  EntrepreneurPulse.com.au  Fact Sheet for Healthcare Providers:  IncredibleEmployment.be  This test is no t yet approved or cleared by the [place] FDA and  has been authorized for detection and/or diagnosis of SARS-CoV-2 by FDA under an Emergency Use Authorization (EUA). This EUA will remain  in effect (meaning this test can be used) for the duration of the COVID-19 declaration under Section 564(b)(1) of the Act, 21 U.S.C.section 360bbb-3(b)(1), unless the authorization is terminated  or revoked sooner.       Influenza A by PCR NEGATIVE NEGATIVE Final   Influenza B by PCR NEGATIVE NEGATIVE Final    Comment: (NOTE) The Xpert Xpress SARS-CoV-2/FLU/RSV plus assay is intended as an aid in the diagnosis of influenza from Nasopharyngeal swab specimens and should not be used as a sole basis for treatment. Nasal washings and aspirates are unacceptable for Xpert Xpress SARS-CoV-2/FLU/RSV testing.  Fact Sheet for Patients: EntrepreneurPulse.com.au  Fact Sheet for Healthcare Providers: IncredibleEmployment.be  This test is not yet approved or cleared by the [place] FDA and has been authorized for detection  and/or diagnosis of SARS-CoV-2 by FDA under an Emergency Use Authorization (EUA). This EUA will remain in effect (meaning this test can be used) for the duration of the COVID-19 declaration under Section 564(b)(1) of the Act, 21 U.S.C. section 360bbb-3(b)(1), unless the authorization is terminated or revoked.  Performed at [facility], [place] [address], [place], [place] [address]     Coagulation Studies: No results for input(s): LABPROT, INR in the last 72 hours.  Urinalysis: No results  for input(s): COLORURINE, LABSPEC, PHURINE, GLUCOSEU, HGBUR, BILIRUBINUR, KETONESUR, PROTEINUR, UROBILINOGEN, NITRITE, LEUKOCYTESUR in the last 72 hours.  Invalid input(s): APPERANCEUR    Imaging: No results found.   Medications:    . amLODipine  10 mg Oral Daily  . atorvastatin  10 mg Oral QHS  . Chlorhexidine Gluconate Cloth  6 each Topical Q0600  . Chlorhexidine Gluconate Cloth  6 each Topical Q0600  . Chlorhexidine Gluconate Cloth  6 each Topical Q0600  . darbepoetin (ARANESP) injection - DIALYSIS  60 mcg Intravenous Q Wed-HD  . dorzolamide-timolol  1 drop Both Eyes BID  . folic acid  1 mg Oral Daily  . heparin injection (subcutaneous)  5,000 Units Subcutaneous Q8H  . HYDROcodone-acetaminophen  1 tablet Oral Once  . insulin aspart  0-15 Units Subcutaneous Q4H  . metoCLOPramide  5 mg Oral TID AC  . pantoprazole  40 mg Oral BID AC  . sucralfate  1 g Oral QID  . valproic acid  750 mg Oral TID  . ziprasidone  40 mg Oral BID WC   acetaminophen **OR** acetaminophen, hydrALAZINE, labetalol, LORazepam, polyethylene glycol, sorbitol  Assessment/ Plan:  1.Clotted/thrombosed left upper extremity AV graft: Status post declot of AV graft by IR on 2/9, access working well.  2. ESRD:MWF at [facility]: Status post HD on 09/25/2020 1.8 L removed late treatment next dialysis will be 09/26/2020 .   Clinically stable today.  3Hypertension: Increased amlodipine to 10 mg on 2/11, monitor BP.  Continue current medication.UF during dialysis.  4.Anemia of ESRD:Hemoglobin at goal. Continue weekly Aranesp.  5. Metabolic Bone Disease: Calcium and phosphorus level acceptable.  6. Hypokalemia: 3K bath as outpatient.  Potassium level acceptable.  7. Acute metabolic encephalopathy, suicidal and homicidal attempt with history of bipolar mood disorder: Seen by neurology.  Per primary team.   LOS: [place] '@TODAY''@8'$ :56 AM

## 2020-09-26 NOTE — Consult Note (Signed)
  50 year old male with history of DM type 2 with gastroparesis, hypertension, ESRD on HD MWF, diastolic CHF, bipolar disorder was brought tothe hospital due to agitation,confusionandacting violently towards his family. Patient was evaluated by psychiatric service who continues him on Geodon 60 mg BID for Bipolar disorder. However, patient was also evaluated by Neurology service who replaced his Keppra with 750 mg of Depakote DR 3 x daily. Per treating physician(Hospitalist), patient is now slow/somnolent and Neuro service says it not likely due to Depakote.  Patient with acute onset of solemnence, flat affect and slow that began after Depakote. Patient is improved today at this time.   -Depakote level obtained is 77. Ammonia level of 30.  - Will reduce Geodon '20mg'$  po qam and Geodon '40mg'$  po with evening.  -Psychiatry to sign off at this time.

## 2020-09-26 NOTE — Progress Notes (Signed)
PT Cancellation Note  Patient Details Name: Timothy Clark MRN: [id number] DOB: 1971-02-12   Cancelled Treatment:    Reason Eval/Treat Not Completed: Patient at procedure or test/unavailable - at HD, PT to check back at a later time.   Stacie Glaze, PT Acute Rehabilitation Services Pager [phone]  Office [phone]    [name] 09/26/2020, 8:47 AM

## 2020-09-27 DIAGNOSIS — Z789 Other specified health status: Secondary | ICD-10-CM | POA: Diagnosis not present

## 2020-09-27 LAB — BASIC METABOLIC PANEL
Anion gap: 17 — ABNORMAL HIGH (ref 5–15)
BUN: 51 mg/dL — ABNORMAL HIGH (ref 6–20)
CO2: 21 mmol/L — ABNORMAL LOW (ref 22–32)
Calcium: 9.5 mg/dL (ref 8.9–10.3)
Chloride: 97 mmol/L — ABNORMAL LOW (ref 98–111)
Creatinine, Ser: 7.96 mg/dL — ABNORMAL HIGH (ref 0.61–1.24)
GFR, Estimated: 8 mL/min — ABNORMAL LOW (ref >60)
Glucose, Bld: 101 mg/dL — ABNORMAL HIGH (ref 70–99)
Potassium: 4.2 mmol/L (ref 3.5–5.1)
Sodium: 135 mmol/L (ref 135–145)

## 2020-09-27 LAB — CBC
HCT: 39.6 % (ref 39.0–52.0)
Hemoglobin: 14.2 g/dL (ref 13.0–17.0)
MCH: 35 pg — ABNORMAL HIGH (ref 26.0–34.0)
MCHC: 35.9 g/dL (ref 30.0–36.0)
MCV: 97.5 fL (ref 80.0–100.0)
Platelets: 131 10*3/uL — ABNORMAL LOW (ref 150–400)
RBC: 4.06 MIL/uL — ABNORMAL LOW (ref 4.22–5.81)
RDW: 13.5 % (ref 11.5–15.5)
WBC: 7.8 10*3/uL (ref 4.0–10.5)
nRBC: 0.3 % — ABNORMAL HIGH (ref 0.0–0.2)

## 2020-09-27 LAB — GLUCOSE, CAPILLARY
Glucose-Capillary: 101 mg/dL — ABNORMAL HIGH (ref 70–99)
Glucose-Capillary: 129 mg/dL — ABNORMAL HIGH (ref 70–99)
Glucose-Capillary: 130 mg/dL — ABNORMAL HIGH (ref 70–99)
Glucose-Capillary: 141 mg/dL — ABNORMAL HIGH (ref 70–99)
Glucose-Capillary: 203 mg/dL — ABNORMAL HIGH (ref 70–99)

## 2020-09-27 MED ORDER — CHLORHEXIDINE GLUCONATE CLOTH 2 % EX PADS
6.0000 | MEDICATED_PAD | Freq: Every day | CUTANEOUS | Status: DC
  Administered 2020-09-28 – 2020-10-12 (×14): 6 via TOPICAL

## 2020-09-27 NOTE — Progress Notes (Signed)
Timothy Clark ROUNDING NOTE   Subjective:   Interval History: This is a 50 year old gentleman with a history of diabetes and end-stage renal disease Monday Wednesday Friday dialysis [place].  He also has a history of congestive heart failure with diastolic dysfunction.  He was brought to the emergency room with a change in behavior.  Violent activity towards his family members.  Underwent dialysis 09/26/2020. His next dialysis treatment 09/28/2020  Blood pressure 146/80 pulse 80 temperature 98.9 O2 sats 98% room air  Sodium 135 potassium 4.2 chloride 97 CO2 21 BUN 51 creatinine 7.96 glucose 101 calcium 9.5 hemoglobin 14.2  Objective:  Vital signs in last 24 hours:  Temp:  [97 F (36.1 C)-98.9 F (37.2 C)] 98.9 F (37.2 C) (02/17 0752) Pulse Rate:  [78-92] 79 (02/17 0752) Resp:  [11-22] 14 (02/17 0752) BP: (115-146)/(64-94) 146/80 (02/17 0905) SpO2:  [94 %-100 %] 100 % (02/17 0752) Weight:  [108.9 kg] 108.9 kg (02/16 1130)  Weight change:  Filed Weights   09/26/20 1130  Weight: 108.9 kg    Intake/Output: I/O last 3 completed shifts: In: 360 [P.O.:360] Out: [id number] [Urine:400; Other:1332]   Intake/Output this shift:  Total I/O In: 200 [P.O.:200] Out: -   General:NAD, resting comfortably Heart:RRR, s1s2 nl Lungs: Clear b/l, no crackle Abdomen:soft, Non-tender, non-distended Extremities:No LE edema Dialysis Access: AV graft has good thrill and bruit.   Basic Metabolic Panel: Recent Labs  Lab 09/21/20 0353 09/25/20 2215 09/26/20 0500 09/27/20 0233  NA 135 136 135 135  K 4.2 3.3* 2.9* 4.2  CL 98 94* 94* 97*  CO2 17* 24 24 21*  GLUCOSE 69* 153* 109* 101*  BUN 64* 54* 61* 51*  CREATININE 9.78* 8.51* 8.92* 7.96*  CALCIUM 9.0 9.4 9.4 9.5    Liver Function Tests: No results for input(s): AST, ALT, ALKPHOS, BILITOT, PROT, ALBUMIN in the last 168 hours. No results for input(s): LIPASE, AMYLASE in the last 168 hours. Recent Labs  Lab 09/25/20 2215   AMMONIA 30    CBC: Recent Labs  Lab 09/21/20 0353 09/25/20 2330 09/26/20 0500 09/27/20 0233  WBC 7.9 5.5 6.0 7.8  HGB 10.3* 12.7* 12.4* 14.2  HCT 30.8* 35.3* 33.7* 39.6  MCV 102.3* 98.1 97.4 97.5  PLT 104* 133* 143* 131*    Cardiac Enzymes: No results for input(s): CKTOTAL, CKMB, CKMBINDEX, TROPONINI in the last 168 hours.  BNP: Invalid input(s): POCBNP  CBG: Recent Labs  Lab 09/26/20 1502 09/26/20 1937 09/26/20 2321 09/27/20 0331 09/27/20 0750  GLUCAP 172* 197* 144* 101* 129*    Microbiology: Results for orders placed or performed during the hospital encounter of 09/17/20  SARS Coronavirus 2 by RT PCR (hospital order, performed in [facility] hospital lab) Nasopharyngeal Nasopharyngeal Swab     Status: None   Collection Time: 09/17/20  2:13 PM   Specimen: Nasopharyngeal Swab  Result Value Ref Range Status   SARS Coronavirus 2 NEGATIVE NEGATIVE Final    Comment: (NOTE) SARS-CoV-2 target nucleic acids are NOT DETECTED.  The SARS-CoV-2 RNA is generally detectable in upper and lower respiratory specimens during the acute phase of infection. The lowest concentration of SARS-CoV-2 viral copies this assay can detect is 250 copies / mL. A negative result does not preclude SARS-CoV-2 infection and should not be used as the sole basis for treatment or other patient management decisions.  A negative result may occur with improper specimen collection / handling, submission of specimen other than nasopharyngeal swab, presence of viral mutation(s) within the areas targeted  by this assay, and inadequate number of viral copies (<250 copies / mL). A negative result must be combined with clinical observations, patient history, and epidemiological information.  Fact Sheet for Patients:   StrictlyIdeas.no  Fact Sheet for Healthcare Providers: BankingDealers.co.za  This test is not yet approved or  cleared by the [place]  FDA and has been authorized for detection and/or diagnosis of SARS-CoV-2 by FDA under an Emergency Use Authorization (EUA).  This EUA will remain in effect (meaning this test can be used) for the duration of the COVID-19 declaration under Section 564(b)(1) of the Act, 21 U.S.C. section 360bbb-3(b)(1), unless the authorization is terminated or revoked sooner.  Performed at [facility], [address] [place], [place] [address]   MRSA PCR Screening     Status: None   Collection Time: 09/18/20  4:57 PM   Specimen: Nasal Mucosa; Nasopharyngeal  Result Value Ref Range Status   MRSA by PCR NEGATIVE NEGATIVE Final    Comment:        The GeneXpert MRSA Assay (FDA approved for NASAL specimens only), is one component of a comprehensive MRSA colonization surveillance program. It is not intended to diagnose MRSA infection nor to guide or monitor treatment for MRSA infections. Performed at [facility], [place] [address], [place], [place] [address]   Resp Panel by RT-PCR (Flu A&B, Covid) Nasopharyngeal Swab     Status: None   Collection Time: 09/22/20  8:41 AM   Specimen: Nasopharyngeal Swab; Nasopharyngeal(NP) swabs in vial transport medium  Result Value Ref Range Status   SARS Coronavirus 2 by RT PCR NEGATIVE NEGATIVE Final    Comment: (NOTE) SARS-CoV-2 target nucleic acids are NOT DETECTED.  The SARS-CoV-2 RNA is generally detectable in upper respiratory specimens during the acute phase of infection. The lowest concentration of SARS-CoV-2 viral copies this assay can detect is 138 copies/mL. A negative result does not preclude SARS-Cov-2 infection and should not be used as the sole basis for treatment or other patient management decisions. A negative result may occur with  improper specimen collection/handling, submission of specimen other than nasopharyngeal swab, presence of viral mutation(s) within the areas targeted by this assay, and inadequate number of viral copies(<138  copies/mL). A negative result must be combined with clinical observations, patient history, and epidemiological information. The expected result is Negative.  Fact Sheet for Patients:  EntrepreneurPulse.com.au  Fact Sheet for Healthcare Providers:  IncredibleEmployment.be  This test is no t yet approved or cleared by the [place] FDA and  has been authorized for detection and/or diagnosis of SARS-CoV-2 by FDA under an Emergency Use Authorization (EUA). This EUA will remain  in effect (meaning this test can be used) for the duration of the COVID-19 declaration under Section 564(b)(1) of the Act, 21 U.S.C.section 360bbb-3(b)(1), unless the authorization is terminated  or revoked sooner.       Influenza A by PCR NEGATIVE NEGATIVE Final   Influenza B by PCR NEGATIVE NEGATIVE Final    Comment: (NOTE) The Xpert Xpress SARS-CoV-2/FLU/RSV plus assay is intended as an aid in the diagnosis of influenza from Nasopharyngeal swab specimens and should not be used as a sole basis for treatment. Nasal washings and aspirates are unacceptable for Xpert Xpress SARS-CoV-2/FLU/RSV testing.  Fact Sheet for Patients: EntrepreneurPulse.com.au  Fact Sheet for Healthcare Providers: IncredibleEmployment.be  This test is not yet approved or cleared by the [place] FDA and has been authorized for detection and/or diagnosis of SARS-CoV-2 by FDA under an Emergency Use  Authorization (EUA). This EUA will remain in effect (meaning this test can be used) for the duration of the COVID-19 declaration under Section 564(b)(1) of the Act, 21 U.S.C. section 360bbb-3(b)(1), unless the authorization is terminated or revoked.  Performed at [facility], [place] [address], [place], [place] [address]     Coagulation Studies: No results for input(s): LABPROT, INR in the last 72 hours.  Urinalysis: No results for input(s):  COLORURINE, LABSPEC, PHURINE, GLUCOSEU, HGBUR, BILIRUBINUR, KETONESUR, PROTEINUR, UROBILINOGEN, NITRITE, LEUKOCYTESUR in the last 72 hours.  Invalid input(s): APPERANCEUR    Imaging: No results found.   Medications:    . amLODipine  10 mg Oral Daily  . atorvastatin  10 mg Oral QHS  . Chlorhexidine Gluconate Cloth  6 each Topical Q0600  . Chlorhexidine Gluconate Cloth  6 each Topical Q0600  . Chlorhexidine Gluconate Cloth  6 each Topical Q0600  . darbepoetin (ARANESP) injection - DIALYSIS  60 mcg Intravenous Q Wed-HD  . dorzolamide-timolol  1 drop Both Eyes BID  . folic acid  1 mg Oral Daily  . heparin injection (subcutaneous)  5,000 Units Subcutaneous Q8H  . HYDROcodone-acetaminophen  1 tablet Oral Once  . insulin aspart  0-15 Units Subcutaneous Q4H  . metoCLOPramide  5 mg Oral TID AC  . pantoprazole  40 mg Oral BID AC  . sucralfate  1 g Oral QID  . valproic acid  750 mg Oral TID  . ziprasidone  40 mg Oral QPM   And  . ziprasidone  20 mg Oral Q breakfast   acetaminophen **OR** acetaminophen, hydrALAZINE, labetalol, LORazepam, polyethylene glycol, sorbitol  Assessment/ Plan:  1.Clotted/thrombosed left upper extremity AV graft: Status post declot of AV graft by IR on 2/9, access working well.  2. ESRD:MWF at [facility]: Status post HD on 09/26/2020 with 1.5 L removed his next dialysis will be 09/28/2020  3Hypertension: Increased amlodipine to 10 mg on 2/11, monitor BP.  Continue current medication.UF during dialysis.  4.Anemia of ESRD:Hemoglobin at goal. Continue weekly Aranesp.  5. Metabolic Bone Disease: Calcium and phosphorus level acceptable.  6. Hypokalemia: 3K bath as outpatient.  Potassium level acceptable.  7. Acute metabolic encephalopathy, suicidal and homicidal attempt with history of bipolar mood disorder: Seen by neurology.  Per primary team.   LOS: [place] '@TODAY''@9'$ :42 AM

## 2020-09-27 NOTE — TOC Progression Note (Addendum)
Transition of Care (TOC) - Progression Note  Marvetta Gibbons RN, BSN Transitions of Care Unit 4E- RN Case Manager See Treatment Team for direct phone #    Patient Details  Name: Timothy Clark MRN: [id number] Date of Birth: 08-02-1971  Transition of Care [facility]) CM/SW Contact  Dahlia Client, Romeo Rabon, RN Phone Number: 09/27/2020, 4:59 PM  Clinical Narrative:    Received call from Ricka Burdock to discuss transition plan for patient, Per TC with sister explained that there are no SNF bed offers and discussed need to plan on taking pt home with family. Per sister pt was staying with dad most of time who lives in [place]. Pt also was receiving [place] visit from [facility]- sister is agreeable to pt resuming [place] services with [facility] and adding PT/OT to the nursing services. Sister also reports that plan will be for pt to return to the Dad's home to stay most of the time for now. Discussed with sister DME needs- per sister she does not feel pt will use wheelchair and feels like RW and 3n1 would work pt for him. Will request MD to place both [facility] and DME orders for home.  Sister also reporting that there have been some deaths in the family, with services coming up this Saturday- she would prefer pt to stay here until Monday due to this- This Probation officer did explain to sister that if pt was medically stable for discharge per MD- that we could not keep him here for convenience. Sister voiced understanding.  Sister did say that she plans to come see pt in the am.   Call made to [facility] in [phone] and confirmed pt as active with them for [facility] services - they can accept pt back for services- will need orders faxed to them prior to discharge along with d/c summary.   Call made to [employer] for DME needs- RW and 3n1- DME to be delivered to room prior to discharge.  1715- received call from [employer]- sister now wants w/c and not rw- will need order for wheelchair- [employer] to f/u in am   Expected Discharge  Plan: Home/Self Care Barriers to Discharge: Continued Medical Work up  Expected Discharge Plan and Services Expected Discharge Plan: Home/Self Care In-house Referral: Clinical Social Work Discharge Planning Services: CM Consult Post Acute Care Choice: [place] Living arrangements for the past 2 months: Single Family Home                 DME Arranged: 3-N-1,Walker rolling DME Agency: [employer] Date DME Agency Contacted: 09/27/20 Time DME Agency Contacted: 60 Representative spoke with at DME Agency: Freda Munro [place]: RN,PT,OT [place]: [facility], [place]) Date [facility] Agency Contacted: 09/27/20 Time HH Agency Contacted: [place] (SDOH) Interventions    Readmission Risk Interventions Readmission Risk Prevention Plan 07/24/2020 10/27/2018 10/25/2018  Transportation Screening Complete Complete Complete  PCP or Specialist Appt within 3-5 Days - Complete -  Home Care Screening Complete - -  Medication Review (RN CM) Complete - -  HRI or [place] - Complete -  Social Work Consult for [place] Planning/Counseling - Not Complete -  Palliative Care Screening - Not Applicable Not Applicable  Medication Review (RN Care Manager) - Complete -  Some recent data might be hidden

## 2020-09-27 NOTE — Consult Note (Signed)
  50 year old male with history of DM type 2 with gastroparesis, hypertension, ESRD on HD MWF, diastolic CHF, bipolar disorder was brought tothe hospital due to agitation,confusionandacting violently towards his family. Patient was evaluated by psychiatric service who continues him on Geodon 60 mg BID for Bipolar disorder. However, patient was also evaluated by Neurology service who replaced his Keppra with 750 mg of Depakote DR 3 x daily. Per treating physician(Hospitalist), patient is now slow/somnolent and Neuro service says it not likely due to Depakote.  Psychiatric consult place for patient sister [name] [id number] request psychiatric reconsult, states patient is not himself, and any request psychiatry to call her.  This nurse practitioner placed a call to [facility], to address concerns.  Writer introduced self and the reason for call, in which she reports she is very concerned about her brother.  She states patient has been becoming increasingly more aggressive and violent, has recently been  " [facility]red from [facility]" due to his behaviors.  She expresses great concern regarding his level of aggression, and how he currently resides with his father who is 46 years old and also has dementia.  She states her brother has previously went and laid in the road, and required lots of talking and coaxing in order to get him out to road.  She states that he is no longer able to provide care to himself, due to his worsening behavior disturbances.  She is requesting that he be placed in this facility, 3 to 4 weeks so that he can be evaluated.  She reports he was previously accepted into a facility and [facility], in which he declined.  She reports a history of previously attempting to obtain guardianship and or power of attorney, so that he can decline placement.  She endorses much concern surrounding his mental health, and wishing he receive appropriate resources.  She also inquires about long-acting  injectable medication, and which we reviewed his current medication list.  We did discuss he has been resumed on Depakote, with dose increase to further target agitation, aggression, behavioral disturbances, and violence.  He is also taking Geodon which is use for mania, hallucinations, and adjunct therapy for management of aggression agitation.  She states she will presented to the hospital hopefully tomorrow morning, to evaluate him and determine if he has improved or if he is at his baseline.  She does continue to ruminate her concerns and patient returning home, and not being a safe environment.  She is encouraged to discuss these concerns with social work, as psychiatry team does not assist with placement.  At current time patient does not meet inpatient psychiatric criteria. -Psychiatry to sign off at this time.

## 2020-09-27 NOTE — Progress Notes (Signed)
Physical Therapy Treatment Patient Details Name: Timothy Clark MRN: [id number] DOB: 1970-09-05 Today's Date: 09/27/2020    History of Present Illness The pt is a 50 yo male presenting with confusion, demonstrating self-harm and acting violently towards his family. Pt found to have acute metabolic encephalopathy. PMH includes: ESRD on HD MWF, seizures, HTN, DM II, CHF, depression, bipolar disorder.    PT Comments    Pt agreeable to PT session with focus on progression of transfers and OOB stability at this time. The pt was able to complete multiple sit-stand transfers at this time, but requires heavy use of recliner for UE support as well as mod-maxA to complete power up to stand. Pt with very strong posterior lean upon standing which he is not able to recognize or correct without significant assist. Given the pt's abilities today, it was unsafe to attempt further progression of gait without additional assistance, lateral scooting was used to allow pt to transfer to chair which he completed with modA of 2. The pt continues to be a very high fall risk due to poor strength, stability, and decreased reactions and safety awareness to make corrections as needed. Will continue to progress pt mobility and transfers to allow for improved safety and reduced caregiver burden.     Follow Up Recommendations  SNF;Supervision/Assistance - 24 hour     Equipment Recommendations  Rolling walker with 5" wheels;Wheelchair (measurements PT);Wheelchair cushion (measurements PT);3in1 (PT) (if going home)    Recommendations for Other Services       Precautions / Restrictions Precautions Precautions: Fall Precaution Comments: pt is blind Restrictions Weight Bearing Restrictions: No    Mobility  Bed Mobility Overal bed mobility: Needs Assistance Bed Mobility: Supine to Sit     Supine to sit: Mod assist;HOB elevated     General bed mobility comments: MOD A for bed mobility with pt needing step by step  cues to sequence task and significant assist to elevate trunk with pt reaching out for therapist    Transfers Overall transfer level: Needs assistance Equipment used:  (recliner) Transfers: Sit to/from Stand;Lateral/Scoot Transfers Sit to Stand: Mod assist        Lateral/Scoot Transfers: Mod assist General transfer comment: modA to power up from bed with single UE on recliner back for support. Pt withstrong posterior lean requiring modA to steady, continued cues to reposition and balance. completed x5 from bed. lateral scoot to R withmodA to assist at hips, good clearance with hips  Ambulation/Gait             General Gait Details: deferred for safety today       Balance Overall balance assessment: Needs assistance Sitting-balance support: Feet supported;No upper extremity supported Sitting balance-Leahy Scale: Poor Sitting balance - Comments: slight posterior lean with static sitting balance, mim guard for safety Postural control: Posterior lean Standing balance support: Bilateral upper extremity supported Standing balance-Leahy Scale: Poor Standing balance comment: reliant on external support and BUE support to maintain static stance                            Cognition Arousal/Alertness: Awake/alert Behavior During Therapy: Flat affect (eyes closed during session) Overall Cognitive Status: Impaired/Different from baseline Area of Impairment: Attention;Following commands;Safety/judgement;Awareness;Problem solving                   Current Attention Level: Focused   Following Commands: Follows one step commands with increased time;Follows one step commands inconsistently Safety/Judgement:  Decreased awareness of safety;Decreased awareness of deficits Awareness: Intellectual Problem Solving: Slow processing;Decreased initiation;Difficulty sequencing;Requires verbal cues;Requires tactile cues General Comments: pt continues to present with slow  processing needing increased time to follow commands, pt slow to respond and noted to keep eyes closed during session. pt with imparied awareness that could be due to visual deficits, but also with poor processing and problem solving with repeated tasks where pt had been oriented to surroundings.      Exercises      General Comments General comments (skin integrity, edema, etc.): VSS through session. pt with flat affect through session and maintained eyes closed      Pertinent Vitals/Pain Pain Assessment: No/denies pain           PT Goals (current goals can now be found in the care plan section) Acute Rehab PT Goals Patient Stated Goal: sit in chair PT Goal Formulation: With patient Time For Goal Achievement: 10/05/20 Potential to Achieve Goals: Good Progress towards PT goals: Progressing toward goals    Frequency    Min 3X/week      PT Plan Current plan remains appropriate       AM-PAC PT "6 Clicks" Mobility   Outcome Measure  Help needed turning from your back to your side while in a flat bed without using bedrails?: A Little Help needed moving from lying on your back to sitting on the side of a flat bed without using bedrails?: A Lot Help needed moving to and from a bed to a chair (including a wheelchair)?: A Lot Help needed standing up from a chair using your arms (e.g., wheelchair or bedside chair)?: A Lot Help needed to walk in hospital room?: A Lot Help needed climbing 3-5 steps with a railing? : Total 6 Click Score: 12    End of Session Equipment Utilized During Treatment: Gait belt Activity Tolerance: Patient tolerated treatment well Patient left: in chair;with call bell/phone within reach;with chair alarm set Nurse Communication: Mobility status PT Visit Diagnosis: Unsteadiness on feet (R26.81);Other abnormalities of gait and mobility (R26.89)     Time: [id number] PT Time Calculation (min) (ACUTE ONLY): 15 min  Charges:  $Therapeutic Activity: 8-22  mins                     Karma Ganja, PT, DPT   Acute Rehabilitation Department Pager #: [phone]   Otho Bellows 09/27/2020, 5:19 PM

## 2020-09-27 NOTE — Progress Notes (Addendum)
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 09/19/2020 16:58    Lab Data:  CBC: Recent Labs  Lab 09/21/20 0353 09/25/20 2330 09/26/20 0500 09/27/20 0233  WBC 7.9 5.5 6.0 7.8  HGB 10.3* 12.7* 12.4* 14.2  HCT 30.8* 35.3* 33.7* 39.6  MCV 102.3* 98.1 97.4 97.5  PLT 104* 133* 143* [id number]*   Basic Metabolic Panel: Recent Labs  Lab 09/21/20 0353 09/25/20 2215 09/26/20 0500 09/27/20 0233  NA 135 136 135 135  K 4.2 3.3* 2.9* 4.2  CL 98 94* 94* 97*  CO2 17* 24 24 21*  GLUCOSE 69* 153* 109* 101*  BUN 64* 54* 61* 51*  CREATININE 9.78* 8.51* 8.92* 7.96*  CALCIUM 9.0 9.4 9.4 9.5   GFR: Estimated Creatinine Clearance: 13.9 mL/min (A) (by C-G formula based on SCr of 7.96 mg/dL (H)). Liver Function Tests: No results for input(s): AST, ALT, ALKPHOS, BILITOT, PROT, ALBUMIN in the last 168 hours. No results for input(s): LIPASE, AMYLASE in the last 168  hours. Recent Labs  Lab 09/25/20 2215  AMMONIA 30   Coagulation Profile: No results for input(s): INR, PROTIME in the last 168 hours. Cardiac Enzymes: No results for input(s): CKTOTAL, CKMB, CKMBINDEX, TROPONINI in the last 168 hours. BNP (last 3 results) No results for input(s): PROBNP in the last 8760 hours. HbA1C: No results for input(s): HGBA1C in the last 72 hours. CBG: Recent Labs  Lab 09/26/20 1937 09/26/20 2321 09/27/20 0331 09/27/20  0750 09/27/20 1151  GLUCAP 197* 144* 101* 129* 130*   Lipid Profile: No results for input(s): CHOL, HDL, LDLCALC, TRIG, CHOLHDL, LDLDIRECT in the last 72 hours. Thyroid Function Tests: No results for input(s): TSH, T4TOTAL, FREET4, T3FREE, THYROIDAB in the last 72 hours. Anemia Panel: No results for input(s): VITAMINB12, FOLATE, FERRITIN, TIBC, IRON, RETICCTPCT in the last 72 hours. Urine analysis:    Component Value Date/Time   COLORURINE YELLOW 10/24/2018 1424   APPEARANCEUR CLEAR 10/24/2018 1424   LABSPEC 1.014 10/24/2018 1424   PHURINE 5.0 10/24/2018 1424   GLUCOSEU >=500 (A) 10/24/2018 1424   HGBUR MODERATE (A) 10/24/2018 1424   BILIRUBINUR NEGATIVE 10/24/2018 1424   KETONESUR NEGATIVE 10/24/2018 1424   PROTEINUR >=300 (A) 10/24/2018 1424   UROBILINOGEN 0.2 06/07/2015 1353   NITRITE NEGATIVE 10/24/2018 1424   LEUKOCYTESUR NEGATIVE 10/24/2018 [place] M.D. [facility] 09/27/2020, 1:40 PM   Call night coverage person covering after 7pm

## 2020-09-28 DIAGNOSIS — Z789 Other specified health status: Secondary | ICD-10-CM | POA: Diagnosis not present

## 2020-09-28 LAB — GLUCOSE, CAPILLARY
Glucose-Capillary: 114 mg/dL — ABNORMAL HIGH (ref 70–99)
Glucose-Capillary: 129 mg/dL — ABNORMAL HIGH (ref 70–99)
Glucose-Capillary: 139 mg/dL — ABNORMAL HIGH (ref 70–99)
Glucose-Capillary: 177 mg/dL — ABNORMAL HIGH (ref 70–99)

## 2020-09-28 LAB — RENAL FUNCTION PANEL
Albumin: 3.1 g/dL — ABNORMAL LOW (ref 3.5–5.0)
Anion gap: 15 (ref 5–15)
BUN: 62 mg/dL — ABNORMAL HIGH (ref 6–20)
CO2: 22 mmol/L (ref 22–32)
Calcium: 8.8 mg/dL — ABNORMAL LOW (ref 8.9–10.3)
Chloride: 93 mmol/L — ABNORMAL LOW (ref 98–111)
Creatinine, Ser: 7.97 mg/dL — ABNORMAL HIGH (ref 0.61–1.24)
GFR, Estimated: 8 mL/min — ABNORMAL LOW (ref >60)
Glucose, Bld: 120 mg/dL — ABNORMAL HIGH (ref 70–99)
Phosphorus: 3.6 mg/dL (ref 2.5–4.6)
Potassium: 2.7 mmol/L — CL (ref 3.5–5.1)
Sodium: 130 mmol/L — ABNORMAL LOW (ref 135–145)

## 2020-09-28 LAB — CBC
HCT: 35 % — ABNORMAL LOW (ref 39.0–52.0)
Hemoglobin: 12.7 g/dL — ABNORMAL LOW (ref 13.0–17.0)
MCH: 35.5 pg — ABNORMAL HIGH (ref 26.0–34.0)
MCHC: 36.3 g/dL — ABNORMAL HIGH (ref 30.0–36.0)
MCV: 97.8 fL (ref 80.0–100.0)
Platelets: 120 10*3/uL — ABNORMAL LOW (ref 150–400)
RBC: 3.58 MIL/uL — ABNORMAL LOW (ref 4.22–5.81)
RDW: 13.2 % (ref 11.5–15.5)
WBC: 8.5 10*3/uL (ref 4.0–10.5)
nRBC: 0 % (ref 0.0–0.2)

## 2020-09-28 MED ORDER — SODIUM CHLORIDE 0.9 % IV SOLN
100.0000 mL | INTRAVENOUS | Status: DC | PRN
Start: 2020-09-28 — End: 2020-09-28

## 2020-09-28 MED ORDER — SODIUM CHLORIDE 0.9 % IV SOLN: 100.0000 mL | INTRAVENOUS | Status: DC | PRN

## 2020-09-28 MED ORDER — LIDOCAINE-PRILOCAINE 2.5-2.5 % EX CREA
1.0000 | TOPICAL_CREAM | CUTANEOUS | Status: DC | PRN
Start: 2020-09-28 — End: 2020-09-28

## 2020-09-28 MED ORDER — HEPARIN SODIUM (PORCINE) 1000 UNIT/ML DIALYSIS: 1000.0000 [IU] | INTRAMUSCULAR | Status: DC | PRN

## 2020-09-28 MED ORDER — LIDOCAINE HCL (PF) 1 % IJ SOLN
5.0000 mL | INTRAMUSCULAR | Status: DC | PRN
Start: 2020-09-28 — End: 2020-09-28

## 2020-09-28 MED ORDER — ALTEPLASE 2 MG IJ SOLR: 2.0000 mg | Freq: Once | INTRAMUSCULAR | Status: DC | PRN

## 2020-09-28 MED ORDER — PENTAFLUOROPROP-TETRAFLUOROETH EX AERO: 1.0000 "application " | INHALATION_SPRAY | CUTANEOUS | Status: DC | PRN

## 2020-09-28 NOTE — Care Management (Addendum)
    Durable Medical Equipment  (From admission, onward)         Start     Ordered   09/28/20 1001  For home use only DME standard manual wheelchair with seat cushion  Once       Comments: Patient suffers from metabolic encephalopathy which impairs their ability to perform daily activities like ambulating in the home.  A rolling walker will not resolve issue with performing activities of daily living. A wheelchair will allow patient to safely perform daily activities. Patient can safely propel the wheelchair in the home or has a caregiver who can provide assistance. Length of need :6 months.  Accessories: elevating leg rests (ELRs), wheel locks, extensions and anti-tippers.   09/28/20 1002   09/27/20 1456  For home use only DME 3 n 1  Once        09/27/20 1455   09/27/20 1456  For home use only DME Walker rolling  Once       Question Answer Comment  Walker: With [place]   Patient needs a walker to treat with the following condition Balance problem      09/27/20 [place], RN, BSN  Trauma/Neuro ICU Case Manager [phone]
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
## 2020-09-28 NOTE — Progress Notes (Signed)
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 72 hours. Urine analysis:    Component Value Date/Time   COLORURINE YELLOW 10/24/2018 1424   APPEARANCEUR CLEAR 10/24/2018 1424   LABSPEC 1.014 10/24/2018 1424   PHURINE 5.0 10/24/2018 1424   GLUCOSEU >=500 (A) 10/24/2018 1424   HGBUR MODERATE (A) 10/24/2018 1424   BILIRUBINUR NEGATIVE 10/24/2018 1424   KETONESUR NEGATIVE 10/24/2018 1424   PROTEINUR >=300 (A) 10/24/2018 1424   UROBILINOGEN 0.2 06/07/2015 1353   NITRITE NEGATIVE 10/24/2018 1424   LEUKOCYTESUR NEGATIVE 10/24/2018 Per[place] M.D. PhD FACP [facility] 09/28/2020, 4:25 PM   Call night coverage person covering after 7pm

## 2020-09-28 NOTE — Progress Notes (Signed)
[facility] ROUNDING NOTE   Subjective:   Brief history: This is a 50 year old gentleman with a history of diabetes and end-stage renal disease Monday Wednesday Friday dialysis [place].  He also has a history of congestive heart failure with diastolic dysfunction.  He was brought to the emergency room with a change in behavior.  Violent activity towards his family members.  Interval history: Patient in good spirits today.  Tolerating dialysis without any issues.  Labs returned while the patient was on dialysis with a potassium of 2.7.  He was switched to a 4K bath at that time  Objective:  Vital signs in last 24 hours:  Temp:  [96.6 F (35.9 C)-99 F (37.2 C)] 96.6 F (35.9 C) (02/17 2316) Pulse Rate:  [72-95] 84 (02/18 0700) Resp:  [14-21] 21 (02/18 0700) BP: (107-143)/(63-78) 126/78 (02/18 0732) SpO2:  [97 %-100 %] 97 % (02/18 0700) Weight:  [111.1 kg] 111.1 kg (02/18 0715)  Weight change:  Filed Weights   09/26/20 1130 09/28/20 0715  Weight: 108.9 kg 111.1 kg    Intake/Output: I/O last 3 completed shifts: In: 640 [P.O.:640] Out: 300 [Urine:300]   Intake/Output this shift:  No intake/output data recorded.  General:NAD, resting comfortably Heart:RRR, s1s2 nl Lungs: Clear b/l, no crackle Abdomen:soft, Non-tender, non-distended Extremities:No LE edema Dialysis Access: AV graft has good thrill and bruit.   Basic Metabolic Panel: Recent Labs  Lab 09/25/20 2215 09/26/20 0500 09/27/20 0233 09/28/20 0800  NA 136 135 135 130*  K 3.3* 2.9* 4.2 2.7*  CL 94* 94* 97* 93*  CO2 24 24 21* 22  GLUCOSE 153* 109* 101* 120*  BUN 54* 61* 51* 62*  CREATININE 8.51* 8.92* 7.96* 7.97*  CALCIUM 9.4 9.4 9.5 8.8*  PHOS  --   --   --  3.6    Liver Function Tests: Recent Labs  Lab 09/28/20 0800  ALBUMIN 3.1*   No results for input(s): LIPASE, AMYLASE in the last 168 hours. Recent Labs  Lab 09/25/20 2215  AMMONIA 30    CBC: Recent Labs  Lab 09/25/20 2330  09/26/20 0500 09/27/20 0233 09/28/20 0202  WBC 5.5 6.0 7.8 8.5  HGB 12.7* 12.4* 14.2 12.7*  HCT 35.3* 33.7* 39.6 35.0*  MCV 98.1 97.4 97.5 97.8  PLT 133* 143* 131* 120*    Cardiac Enzymes: No results for input(s): CKTOTAL, CKMB, CKMBINDEX, TROPONINI in the last 168 hours.  BNP: Invalid input(s): POCBNP  CBG: Recent Labs  Lab 09/27/20 1151 09/27/20 1602 09/27/20 2017 09/28/20 0009 09/28/20 0353  GLUCAP 130* 141* 203* 129* 114*    Microbiology: Results for orders placed or performed during the hospital encounter of 09/17/20  SARS Coronavirus 2 by RT PCR (hospital order, performed in [facility] hospital lab) Nasopharyngeal Nasopharyngeal Swab     Status: None   Collection Time: 09/17/20  2:13 PM   Specimen: Nasopharyngeal Swab  Result Value Ref Range Status   SARS Coronavirus 2 NEGATIVE NEGATIVE Final    Comment: (NOTE) SARS-CoV-2 target nucleic acids are NOT DETECTED.  The SARS-CoV-2 RNA is generally detectable in upper and lower respiratory specimens during the acute phase of infection. The lowest concentration of SARS-CoV-2 viral copies this assay can detect is 250 copies / mL. A negative result does not preclude SARS-CoV-2 infection and should not be used as the sole basis for treatment or other patient management decisions.  A negative result may occur with improper specimen collection / handling, submission of specimen other than nasopharyngeal swab, presence of viral  mutation(s) within the areas targeted by this assay, and inadequate number of viral copies (<250 copies / mL). A negative result must be combined with clinical observations, patient history, and epidemiological information.  Fact Sheet for Patients:   StrictlyIdeas.no  Fact Sheet for Healthcare Providers: BankingDealers.co.za  This test is not yet approved or  cleared by the [place] FDA and has been authorized for detection and/or diagnosis  of SARS-CoV-2 by FDA under an Emergency Use Authorization (EUA).  This EUA will remain in effect (meaning this test can be used) for the duration of the COVID-19 declaration under Section 564(b)(1) of the Act, 21 U.S.C. section 360bbb-3(b)(1), unless the authorization is terminated or revoked sooner.  Performed at [facility], [address] [place], [place] [address]   MRSA PCR Screening     Status: None   Collection Time: 09/18/20  4:57 PM   Specimen: Nasal Mucosa; Nasopharyngeal  Result Value Ref Range Status   MRSA by PCR NEGATIVE NEGATIVE Final    Comment:        The GeneXpert MRSA Assay (FDA approved for NASAL specimens only), is one component of a comprehensive MRSA colonization surveillance program. It is not intended to diagnose MRSA infection nor to guide or monitor treatment for MRSA infections. Performed at [facility], [place] [address] [place], [place] [address]   Resp Panel by RT-PCR (Flu A&B, Covid) Nasopharyngeal Swab     Status: None   Collection Time: 09/22/20  8:41 AM   Specimen: Nasopharyngeal Swab; Nasopharyngeal(NP) swabs in vial transport medium  Result Value Ref Range Status   SARS Coronavirus 2 by RT PCR NEGATIVE NEGATIVE Final    Comment: (NOTE) SARS-CoV-2 target nucleic acids are NOT DETECTED.  The SARS-CoV-2 RNA is generally detectable in upper respiratory specimens during the acute phase of infection. The lowest concentration of SARS-CoV-2 viral copies this assay can detect is 138 copies/mL. A negative result does not preclude SARS-Cov-2 infection and should not be used as the sole basis for treatment or other patient management decisions. A negative result may occur with  improper specimen collection/handling, submission of specimen other than nasopharyngeal swab, presence of viral mutation(s) within the areas targeted by this assay, and inadequate number of viral copies(<138 copies/mL). A negative result must be combined with clinical  observations, patient history, and epidemiological information. The expected result is Negative.  Fact Sheet for Patients:  EntrepreneurPulse.com.au  Fact Sheet for Healthcare Providers:  IncredibleEmployment.be  This test is no t yet approved or cleared by the [place] FDA and  has been authorized for detection and/or diagnosis of SARS-CoV-2 by FDA under an Emergency Use Authorization (EUA). This EUA will remain  in effect (meaning this test can be used) for the duration of the COVID-19 declaration under Section 564(b)(1) of the Act, 21 U.S.C.section 360bbb-3(b)(1), unless the authorization is terminated  or revoked sooner.       Influenza A by PCR NEGATIVE NEGATIVE Final   Influenza B by PCR NEGATIVE NEGATIVE Final    Comment: (NOTE) The Xpert Xpress SARS-CoV-2/FLU/RSV plus assay is intended as an aid in the diagnosis of influenza from Nasopharyngeal swab specimens and should not be used as a sole basis for treatment. Nasal washings and aspirates are unacceptable for Xpert Xpress SARS-CoV-2/FLU/RSV testing.  Fact Sheet for Patients: EntrepreneurPulse.com.au  Fact Sheet for Healthcare Providers: IncredibleEmployment.be  This test is not yet approved or cleared by the [place] FDA and has been authorized for detection and/or diagnosis of SARS-CoV-2 by  FDA under an Emergency Use Authorization (EUA). This EUA will remain in effect (meaning this test can be used) for the duration of the COVID-19 declaration under Section 564(b)(1) of the Act, 21 U.S.C. section 360bbb-3(b)(1), unless the authorization is terminated or revoked.  Performed at [facility], [place] [address] [place], [place] [address]     Coagulation Studies: No results for input(s): LABPROT, INR in the last 72 hours.  Urinalysis: No results for input(s): COLORURINE, LABSPEC, PHURINE, GLUCOSEU, HGBUR, BILIRUBINUR,  KETONESUR, PROTEINUR, UROBILINOGEN, NITRITE, LEUKOCYTESUR in the last 72 hours.  Invalid input(s): APPERANCEUR    Imaging: No results found.   Medications:   . sodium chloride    . sodium chloride     . amLODipine  10 mg Oral Daily  . atorvastatin  10 mg Oral QHS  . Chlorhexidine Gluconate Cloth  6 each Topical Q0600  . darbepoetin (ARANESP) injection - DIALYSIS  60 mcg Intravenous Q Wed-HD  . dorzolamide-timolol  1 drop Both Eyes BID  . folic acid  1 mg Oral Daily  . heparin injection (subcutaneous)  5,000 Units Subcutaneous Q8H  . HYDROcodone-acetaminophen  1 tablet Oral Once  . insulin aspart  0-15 Units Subcutaneous Q4H  . metoCLOPramide  5 mg Oral TID AC  . pantoprazole  40 mg Oral BID AC  . sucralfate  1 g Oral QID  . valproic acid  750 mg Oral TID  . ziprasidone  40 mg Oral QPM   And  . ziprasidone  20 mg Oral Q breakfast   sodium chloride, sodium chloride, acetaminophen **OR** acetaminophen, alteplase, heparin, hydrALAZINE, labetalol, lidocaine (PF), lidocaine-prilocaine, LORazepam, pentafluoroprop-tetrafluoroeth, polyethylene glycol, sorbitol  Assessment/ Plan:  1.Clotted/thrombosed left upper extremity AV graft: Status post declot of AV graft by IR on 2/9, access working well.  2. ESRD:MWF at [facility]: Continue dialysis Monday Wednesday Friday schedule.  3Hypertension: Continue current blood pressure medications and continue to monitor  4.Anemia of ESRD:Hemoglobin at goal. Continue weekly Aranesp.  5. Metabolic Bone Disease: Calcium and phosphorus level acceptable.  6. Hypokalemia: 3K bath as outpatient.    Hypokalemia again today.  Unclear cause.  4K bath today  7. Acute metabolic encephalopathy, suicidal and homicidal attempt with history of bipolar mood disorder: Seen by neurology.  Per primary team.   LOS: 11 Shaune Pollack Tyaisha Cullom '@TODAY''@9'$ :35 AM

## 2020-09-29 DIAGNOSIS — Z789 Other specified health status: Secondary | ICD-10-CM | POA: Diagnosis not present

## 2020-09-29 LAB — GLUCOSE, CAPILLARY
Glucose-Capillary: 104 mg/dL — ABNORMAL HIGH (ref 70–99)
Glucose-Capillary: 118 mg/dL — ABNORMAL HIGH (ref 70–99)
Glucose-Capillary: 132 mg/dL — ABNORMAL HIGH (ref 70–99)
Glucose-Capillary: 133 mg/dL — ABNORMAL HIGH (ref 70–99)
Glucose-Capillary: 143 mg/dL — ABNORMAL HIGH (ref 70–99)
Glucose-Capillary: 146 mg/dL — ABNORMAL HIGH (ref 70–99)
Glucose-Capillary: 157 mg/dL — ABNORMAL HIGH (ref 70–99)
Glucose-Capillary: 188 mg/dL — ABNORMAL HIGH (ref 70–99)

## 2020-09-29 MED ORDER — POLYETHYLENE GLYCOL 3350 17 G PO PACK
17.0000 g | PACK | Freq: Every day | ORAL | Status: DC
  Administered 2020-09-29 – 2020-10-12 (×6): 17 g via ORAL
  Filled 2020-09-29 (×7): qty 1

## 2020-09-29 MED ORDER — SENNOSIDES-DOCUSATE SODIUM 8.6-50 MG PO TABS
1.0000 | ORAL_TABLET | Freq: Two times a day (BID) | ORAL | Status: DC
  Administered 2020-09-29 – 2020-10-12 (×19): 1 via ORAL
  Filled 2020-09-29 (×20): qty 1

## 2020-09-29 NOTE — Progress Notes (Signed)
[facility] ROUNDING NOTE   Subjective:   Brief history: This is a 50 year old gentleman with a history of diabetes and end-stage renal disease Monday Wednesday Friday dialysis [place].  He also has a history of congestive heart failure with diastolic dysfunction.  He was brought to the emergency room with a change in behavior.  Violent activity towards his family members.  Interval history: Patient feels well today without specific complaints  Objective:  Vital signs in last 24 hours:  Temp:  [97.5 F (36.4 C)-99.1 F (37.3 C)] 98.3 F (36.8 C) (02/19 1115) Pulse Rate:  [81-93] 81 (02/19 1115) Resp:  [15-20] 16 (02/19 1115) BP: (110-132)/(69-75) 117/69 (02/19 1115) SpO2:  [96 %-100 %] 100 % (02/19 1115)  Weight change:  Filed Weights   09/26/20 1130 09/28/20 0715 09/28/20 1114  Weight: 108.9 kg 111.1 kg 110.4 kg    Intake/Output: I/O last 3 completed shifts: In: 296 [P.O.:296] Out: 2000 [Other:2000]   Intake/Output this shift:  Total I/O In: 120 [P.O.:120] Out: -   General:NAD, resting comfortably Heart:RRR, s1s2 nl Lungs:  Bilateral chest rise with no increased work of breathing Abdomen:soft, Non-tender, non-distended Extremities:No LE edema Dialysis Access: AV graft has good thrill and bruit.   Basic Metabolic Panel: Recent Labs  Lab 09/25/20 2215 09/26/20 0500 09/27/20 0233 09/28/20 0800  NA 136 135 135 130*  K 3.3* 2.9* 4.2 2.7*  CL 94* 94* 97* 93*  CO2 24 24 21* 22  GLUCOSE 153* 109* 101* 120*  BUN 54* 61* 51* 62*  CREATININE 8.51* 8.92* 7.96* 7.97*  CALCIUM 9.4 9.4 9.5 8.8*  PHOS  --   --   --  3.6    Liver Function Tests: Recent Labs  Lab 09/28/20 0800  ALBUMIN 3.1*   No results for input(s): LIPASE, AMYLASE in the last 168 hours. Recent Labs  Lab 09/25/20 2215  AMMONIA 30    CBC: Recent Labs  Lab 09/25/20 2330 09/26/20 0500 09/27/20 0233 09/28/20 0202  WBC 5.5 6.0 7.8 8.5  HGB 12.7* 12.4* 14.2 12.7*  HCT 35.3*  33.7* 39.6 35.0*  MCV 98.1 97.4 97.5 97.8  PLT 133* 143* 131* 120*    Cardiac Enzymes: No results for input(s): CKTOTAL, CKMB, CKMBINDEX, TROPONINI in the last 168 hours.  BNP: Invalid input(s): POCBNP  CBG: Recent Labs  Lab 09/28/20 1659 09/28/20 1949 09/29/20 0000 09/29/20 0308 09/29/20 0753  GLUCAP 139* 177* 133* 104* 146*    Microbiology: Results for orders placed or performed during the hospital encounter of 09/17/20  SARS Coronavirus 2 by RT PCR (hospital order, performed in [facility] hospital lab) Nasopharyngeal Nasopharyngeal Swab     Status: None   Collection Time: 09/17/20  2:13 PM   Specimen: Nasopharyngeal Swab  Result Value Ref Range Status   SARS Coronavirus 2 NEGATIVE NEGATIVE Final    Comment: (NOTE) SARS-CoV-2 target nucleic acids are NOT DETECTED.  The SARS-CoV-2 RNA is generally detectable in upper and lower respiratory specimens during the acute phase of infection. The lowest concentration of SARS-CoV-2 viral copies this assay can detect is 250 copies / mL. A negative result does not preclude SARS-CoV-2 infection and should not be used as the sole basis for treatment or other patient management decisions.  A negative result may occur with improper specimen collection / handling, submission of specimen other than nasopharyngeal swab, presence of viral mutation(s) within the areas targeted by this assay, and inadequate number of viral copies (<250 copies / mL). A negative result must be  combined with clinical observations, patient history, and epidemiological information.  Fact Sheet for Patients:   StrictlyIdeas.no  Fact Sheet for Healthcare Providers: BankingDealers.co.za  This test is not yet approved or  cleared by the [place] FDA and has been authorized for detection and/or diagnosis of SARS-CoV-2 by FDA under an Emergency Use Authorization (EUA).  This EUA will remain in effect (meaning  this test can be used) for the duration of the COVID-19 declaration under Section 564(b)(1) of the Act, 21 U.S.C. section 360bbb-3(b)(1), unless the authorization is terminated or revoked sooner.  Performed at [facility], [address], [place], [place] [address]   MRSA PCR Screening     Status: None   Collection Time: 09/18/20  4:57 PM   Specimen: Nasal Mucosa; Nasopharyngeal  Result Value Ref Range Status   MRSA by PCR NEGATIVE NEGATIVE Final    Comment:        The GeneXpert MRSA Assay (FDA approved for NASAL specimens only), is one component of a comprehensive MRSA colonization surveillance program. It is not intended to diagnose MRSA infection nor to guide or monitor treatment for MRSA infections. Performed at [facility], [place] [address] [place], [place] [address]   Resp Panel by RT-PCR (Flu A&B, Covid) Nasopharyngeal Swab     Status: None   Collection Time: 09/22/20  8:41 AM   Specimen: Nasopharyngeal Swab; Nasopharyngeal(NP) swabs in vial transport medium  Result Value Ref Range Status   SARS Coronavirus 2 by RT PCR NEGATIVE NEGATIVE Final    Comment: (NOTE) SARS-CoV-2 target nucleic acids are NOT DETECTED.  The SARS-CoV-2 RNA is generally detectable in upper respiratory specimens during the acute phase of infection. The lowest concentration of SARS-CoV-2 viral copies this assay can detect is 138 copies/mL. A negative result does not preclude SARS-Cov-2 infection and should not be used as the sole basis for treatment or other patient management decisions. A negative result may occur with  improper specimen collection/handling, submission of specimen other than nasopharyngeal swab, presence of viral mutation(s) within the areas targeted by this assay, and inadequate number of viral copies(<138 copies/mL). A negative result must be combined with clinical observations, patient history, and epidemiological information. The expected result is Negative.  Fact  Sheet for Patients:  EntrepreneurPulse.com.au  Fact Sheet for Healthcare Providers:  IncredibleEmployment.be  This test is no t yet approved or cleared by the [place] FDA and  has been authorized for detection and/or diagnosis of SARS-CoV-2 by FDA under an Emergency Use Authorization (EUA). This EUA will remain  in effect (meaning this test can be used) for the duration of the COVID-19 declaration under Section 564(b)(1) of the Act, 21 U.S.C.section 360bbb-3(b)(1), unless the authorization is terminated  or revoked sooner.       Influenza A by PCR NEGATIVE NEGATIVE Final   Influenza B by PCR NEGATIVE NEGATIVE Final    Comment: (NOTE) The Xpert Xpress SARS-CoV-2/FLU/RSV plus assay is intended as an aid in the diagnosis of influenza from Nasopharyngeal swab specimens and should not be used as a sole basis for treatment. Nasal washings and aspirates are unacceptable for Xpert Xpress SARS-CoV-2/FLU/RSV testing.  Fact Sheet for Patients: EntrepreneurPulse.com.au  Fact Sheet for Healthcare Providers: IncredibleEmployment.be  This test is not yet approved or cleared by the [place] FDA and has been authorized for detection and/or diagnosis of SARS-CoV-2 by FDA under an Emergency Use Authorization (EUA). This EUA will remain in effect (meaning this test can be used) for the duration of  the COVID-19 declaration under Section 564(b)(1) of the Act, 21 U.S.C. section 360bbb-3(b)(1), unless the authorization is terminated or revoked.  Performed at [facility], [place] [address], [place], [place] [address]     Coagulation Studies: No results for input(s): LABPROT, INR in the last 72 hours.  Urinalysis: No results for input(s): COLORURINE, LABSPEC, PHURINE, GLUCOSEU, HGBUR, BILIRUBINUR, KETONESUR, PROTEINUR, UROBILINOGEN, NITRITE, LEUKOCYTESUR in the last 72 hours.  Invalid input(s): APPERANCEUR     Imaging: No results found.   Medications:    . amLODipine  10 mg Oral Daily  . atorvastatin  10 mg Oral QHS  . Chlorhexidine Gluconate Cloth  6 each Topical Q0600  . dorzolamide-timolol  1 drop Both Eyes BID  . folic acid  1 mg Oral Daily  . heparin injection (subcutaneous)  5,000 Units Subcutaneous Q8H  . HYDROcodone-acetaminophen  1 tablet Oral Once  . insulin aspart  0-15 Units Subcutaneous Q4H  . metoCLOPramide  5 mg Oral TID AC  . pantoprazole  40 mg Oral BID AC  . polyethylene glycol  17 g Oral Daily  . senna-docusate  1 tablet Oral BID  . sucralfate  1 g Oral QID  . valproic acid  750 mg Oral TID  . ziprasidone  40 mg Oral QPM   And  . ziprasidone  20 mg Oral Q breakfast   acetaminophen **OR** acetaminophen, hydrALAZINE, labetalol, LORazepam, polyethylene glycol, sorbitol  Assessment/ Plan:  1.Clotted/thrombosed left upper extremity AV graft: Status post declot of AV graft by IR on 2/9, access working well.  2. ESRD:MWF at [facility]: Continue dialysis Monday Wednesday Friday schedule.  3Hypertension: Continue current blood pressure medications and continue to monitor  4.Anemia of ESRD:Hemoglobin at goal. Continue weekly Aranesp.  5. Metabolic Bone Disease: Calcium and phosphorus level acceptable.  6. Hypokalemia: 3K bath as outpatient.    Hypokalemia again today.  Unclear cause. Follow-up RFP today  7. Acute metabolic encephalopathy, suicidal and homicidal attempt with history of bipolar mood disorder: Seen by neurology.  Per primary team.   LOS: 12 Reesa Chew '@TODAY''@11'$ :17 AM

## 2020-09-29 NOTE — Progress Notes (Signed)
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 10/24/2018 1424   KETONESUR NEGATIVE 10/24/2018 1424   PROTEINUR >=300 (A) 10/24/2018 1424   UROBILINOGEN 0.2 06/07/2015 1353   NITRITE NEGATIVE 10/24/2018 1424   LEUKOCYTESUR NEGATIVE 10/24/2018 [employer] M.D. PhD FACP [facility] 09/29/2020, 3:57 PM   Call night coverage person covering after 7pm

## 2020-09-30 DIAGNOSIS — Z789 Other specified health status: Secondary | ICD-10-CM | POA: Diagnosis not present

## 2020-09-30 LAB — RENAL FUNCTION PANEL
Albumin: 3.3 g/dL — ABNORMAL LOW (ref 3.5–5.0)
Anion gap: 20 — ABNORMAL HIGH (ref 5–15)
BUN: 68 mg/dL — ABNORMAL HIGH (ref 6–20)
CO2: 18 mmol/L — ABNORMAL LOW (ref 22–32)
Calcium: 9.3 mg/dL (ref 8.9–10.3)
Chloride: 95 mmol/L — ABNORMAL LOW (ref 98–111)
Creatinine, Ser: 8.87 mg/dL — ABNORMAL HIGH (ref 0.61–1.24)
GFR, Estimated: 7 mL/min — ABNORMAL LOW (ref >60)
Glucose, Bld: 93 mg/dL (ref 70–99)
Phosphorus: 4.1 mg/dL (ref 2.5–4.6)
Potassium: 3.1 mmol/L — ABNORMAL LOW (ref 3.5–5.1)
Sodium: 133 mmol/L — ABNORMAL LOW (ref 135–145)

## 2020-09-30 LAB — GLUCOSE, CAPILLARY
Glucose-Capillary: 98 mg/dL (ref 70–99)
Glucose-Capillary: 104 mg/dL — ABNORMAL HIGH (ref 70–99)
Glucose-Capillary: 157 mg/dL — ABNORMAL HIGH (ref 70–99)
Glucose-Capillary: 157 mg/dL — ABNORMAL HIGH (ref 70–99)
Glucose-Capillary: 128 mg/dL — ABNORMAL HIGH (ref 70–99)

## 2020-09-30 LAB — MAGNESIUM: Magnesium: 2.4 mg/dL (ref 1.7–2.4)

## 2020-09-30 MED ORDER — POTASSIUM CHLORIDE CRYS ER 20 MEQ PO TBCR
40.0000 meq | EXTENDED_RELEASE_TABLET | Freq: Once | ORAL | Status: AC
  Administered 2020-09-30: 40 meq via ORAL
  Filled 2020-09-30: qty 2

## 2020-09-30 NOTE — Progress Notes (Signed)
[facility] ROUNDING NOTE   Subjective:   Brief history: This is a 50 year old gentleman with a history of diabetes and end-stage renal disease Monday Wednesday Friday dialysis [place].  He also has a history of congestive heart failure with diastolic dysfunction.  He was brought to the emergency room with a change in behavior.  Violent activity towards his family members.  Interval history: Patient feels well today without specific complaints  Objective:  Vital signs in last 24 hours:  Temp:  [97.8 F (36.6 C)-98.3 F (36.8 C)] 98.1 F (36.7 C) (02/20 0735) Pulse Rate:  [73-81] 81 (02/20 0735) Resp:  [15-20] 16 (02/20 0735) BP: (115-134)/(68-81) 134/77 (02/20 0735) SpO2:  [96 %-100 %] 99 % (02/20 0735)  Weight change:  Filed Weights   09/26/20 1130 09/28/20 0715 09/28/20 1114  Weight: 108.9 kg 111.1 kg 110.4 kg    Intake/Output: I/O last 3 completed shifts: In: 120 [P.O.:120] Out: -    Intake/Output this shift:  No intake/output data recorded.  General:NAD, resting comfortably Heart:RRR, s1s2 nl Lungs:  Bilateral chest rise with no increased work of breathing Abdomen:soft, Non-tender, non-distended Extremities:No LE edema Dialysis Access: AV graft has good thrill and bruit.   Basic Metabolic Panel: Recent Labs  Lab 09/25/20 2215 09/26/20 0500 09/27/20 0233 09/28/20 0800 09/30/20 0407  NA 136 135 135 130* 133*  K 3.3* 2.9* 4.2 2.7* 3.1*  CL 94* 94* 97* 93* 95*  CO2 24 24 21* 22 18*  GLUCOSE 153* 109* 101* 120* 93  BUN 54* 61* 51* 62* 68*  CREATININE 8.51* 8.92* 7.96* 7.97* 8.87*  CALCIUM 9.4 9.4 9.5 8.8* 9.3  PHOS  --   --   --  3.6 4.1    Liver Function Tests: Recent Labs  Lab 09/28/20 0800 09/30/20 0407  ALBUMIN 3.1* 3.3*   No results for input(s): LIPASE, AMYLASE in the last 168 hours. Recent Labs  Lab 09/25/20 2215  AMMONIA 30    CBC: Recent Labs  Lab 09/25/20 2330 09/26/20 0500 09/27/20 0233 09/28/20 0202  WBC 5.5 6.0  7.8 8.5  HGB 12.7* 12.4* 14.2 12.7*  HCT 35.3* 33.7* 39.6 35.0*  MCV 98.1 97.4 97.5 97.8  PLT 133* 143* 131* 120*    Cardiac Enzymes: No results for input(s): CKTOTAL, CKMB, CKMBINDEX, TROPONINI in the last 168 hours.  BNP: Invalid input(s): POCBNP  CBG: Recent Labs  Lab 09/29/20 1533 09/29/20 2031 09/29/20 2353 09/30/20 0344 09/30/20 0747  GLUCAP 118* 132* 188* 98 104*    Microbiology: Results for orders placed or performed during the hospital encounter of 09/17/20  SARS Coronavirus 2 by RT PCR (hospital order, performed in [facility] hospital lab) Nasopharyngeal Nasopharyngeal Swab     Status: None   Collection Time: 09/17/20  2:13 PM   Specimen: Nasopharyngeal Swab  Result Value Ref Range Status   SARS Coronavirus 2 NEGATIVE NEGATIVE Final    Comment: (NOTE) SARS-CoV-2 target nucleic acids are NOT DETECTED.  The SARS-CoV-2 RNA is generally detectable in upper and lower respiratory specimens during the acute phase of infection. The lowest concentration of SARS-CoV-2 viral copies this assay can detect is 250 copies / mL. A negative result does not preclude SARS-CoV-2 infection and should not be used as the sole basis for treatment or other patient management decisions.  A negative result may occur with improper specimen collection / handling, submission of specimen other than nasopharyngeal swab, presence of viral mutation(s) within the areas targeted by this assay, and inadequate number of viral  copies (<250 copies / mL). A negative result must be combined with clinical observations, patient history, and epidemiological information.  Fact Sheet for Patients:   StrictlyIdeas.no  Fact Sheet for Healthcare Providers: BankingDealers.co.za  This test is not yet approved or  cleared by the [place] FDA and has been authorized for detection and/or diagnosis of SARS-CoV-2 by FDA under an Emergency Use Authorization  (EUA).  This EUA will remain in effect (meaning this test can be used) for the duration of the COVID-19 declaration under Section 564(b)(1) of the Act, 21 U.S.C. section 360bbb-3(b)(1), unless the authorization is terminated or revoked sooner.  Performed at [facility], [address] [place], [place] [address]   MRSA PCR Screening     Status: None   Collection Time: 09/18/20  4:57 PM   Specimen: Nasal Mucosa; Nasopharyngeal  Result Value Ref Range Status   MRSA by PCR NEGATIVE NEGATIVE Final    Comment:        The GeneXpert MRSA Assay (FDA approved for NASAL specimens only), is one component of a comprehensive MRSA colonization surveillance program. It is not intended to diagnose MRSA infection nor to guide or monitor treatment for MRSA infections. Performed at [facility], [place] [address] [place], [place] [address]   Resp Panel by RT-PCR (Flu A&B, Covid) Nasopharyngeal Swab     Status: None   Collection Time: 09/22/20  8:41 AM   Specimen: Nasopharyngeal Swab; Nasopharyngeal(NP) swabs in vial transport medium  Result Value Ref Range Status   SARS Coronavirus 2 by RT PCR NEGATIVE NEGATIVE Final    Comment: (NOTE) SARS-CoV-2 target nucleic acids are NOT DETECTED.  The SARS-CoV-2 RNA is generally detectable in upper respiratory specimens during the acute phase of infection. The lowest concentration of SARS-CoV-2 viral copies this assay can detect is 138 copies/mL. A negative result does not preclude SARS-Cov-2 infection and should not be used as the sole basis for treatment or other patient management decisions. A negative result may occur with  improper specimen collection/handling, submission of specimen other than nasopharyngeal swab, presence of viral mutation(s) within the areas targeted by this assay, and inadequate number of viral copies(<138 copies/mL). A negative result must be combined with clinical observations, patient history, and  epidemiological information. The expected result is Negative.  Fact Sheet for Patients:  EntrepreneurPulse.com.au  Fact Sheet for Healthcare Providers:  IncredibleEmployment.be  This test is no t yet approved or cleared by the [place] FDA and  has been authorized for detection and/or diagnosis of SARS-CoV-2 by FDA under an Emergency Use Authorization (EUA). This EUA will remain  in effect (meaning this test can be used) for the duration of the COVID-19 declaration under Section 564(b)(1) of the Act, 21 U.S.C.section 360bbb-3(b)(1), unless the authorization is terminated  or revoked sooner.       Influenza A by PCR NEGATIVE NEGATIVE Final   Influenza B by PCR NEGATIVE NEGATIVE Final    Comment: (NOTE) The Xpert Xpress SARS-CoV-2/FLU/RSV plus assay is intended as an aid in the diagnosis of influenza from Nasopharyngeal swab specimens and should not be used as a sole basis for treatment. Nasal washings and aspirates are unacceptable for Xpert Xpress SARS-CoV-2/FLU/RSV testing.  Fact Sheet for Patients: EntrepreneurPulse.com.au  Fact Sheet for Healthcare Providers: IncredibleEmployment.be  This test is not yet approved or cleared by the [place] FDA and has been authorized for detection and/or diagnosis of SARS-CoV-2 by FDA under an Emergency Use Authorization (EUA). This EUA will remain in effect (  meaning this test can be used) for the duration of the COVID-19 declaration under Section 564(b)(1) of the Act, 21 U.S.C. section 360bbb-3(b)(1), unless the authorization is terminated or revoked.  Performed at [facility], [place] [address], [place], [place] [address]     Coagulation Studies: No results for input(s): LABPROT, INR in the last 72 hours.  Urinalysis: No results for input(s): COLORURINE, LABSPEC, PHURINE, GLUCOSEU, HGBUR, BILIRUBINUR, KETONESUR, PROTEINUR, UROBILINOGEN, NITRITE,  LEUKOCYTESUR in the last 72 hours.  Invalid input(s): APPERANCEUR    Imaging: No results found.   Medications:    . amLODipine  10 mg Oral Daily  . atorvastatin  10 mg Oral QHS  . Chlorhexidine Gluconate Cloth  6 each Topical Q0600  . dorzolamide-timolol  1 drop Both Eyes BID  . folic acid  1 mg Oral Daily  . heparin injection (subcutaneous)  5,000 Units Subcutaneous Q8H  . insulin aspart  0-15 Units Subcutaneous Q4H  . metoCLOPramide  5 mg Oral TID AC  . pantoprazole  40 mg Oral BID AC  . polyethylene glycol  17 g Oral Daily  . senna-docusate  1 tablet Oral BID  . sucralfate  1 g Oral QID  . valproic acid  750 mg Oral TID  . ziprasidone  40 mg Oral QPM   And  . ziprasidone  20 mg Oral Q breakfast   acetaminophen **OR** acetaminophen, hydrALAZINE, labetalol, LORazepam, polyethylene glycol, sorbitol  Assessment/ Plan:  1.Clotted/thrombosed left upper extremity AV graft: Status post declot of AV graft by IR on 2/9, access working well.  2. ESRD:MWF at [facility]: Continue dialysis Monday Wednesday Friday schedule.  3Hypertension: Continue current blood pressure medications and continue to monitor  4.Anemia of ESRD:Hemoglobin at goal. Aranesp on hold  5. Metabolic Bone Disease: Calcium and phosphorus level acceptable.  6. Hypokalemia: 3K bath as outpatient.    Hypokalemia again today.  Unclear cause. Repleting PRN  7. Acute metabolic encephalopathy, suicidal and homicidal attempt with history of bipolar mood disorder: Seen by neurology.  Per primary team.   LOS: [place] '@TODAY''@11'$ :15 AM

## 2020-09-30 NOTE — Plan of Care (Signed)
  Problem: Health Behavior/Discharge Planning: Goal: Ability to manage health-related needs will improve Outcome: Progressing   Problem: Clinical Measurements: Goal: Ability to maintain clinical measurements within normal limits will improve Outcome: Progressing   

## 2020-09-30 NOTE — Progress Notes (Signed)
Spoke with Dr. Erlinda Hong and she reiterated the importance of having the patient sit up in the chair for 3 hours.  Patient sat in the chair for 1.5 hrs this morning and could not tolerate longer so was moved back to the bed (2 assist).  Will transfer the patient back to the chair for dinner with the hope he can tolerate another 1.5 hr. Sit.

## 2020-09-30 NOTE — Progress Notes (Signed)
Patient's sister, [name] came to visit the patient.  She stated that he is not even close to baseline at this point.  She said he usually has his eye open, and speaks in complete sentences.  His responses are slow, but she feel are better than the last visit.

## 2020-09-30 NOTE — Progress Notes (Signed)
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 FOLATE, FERRITIN, TIBC, IRON, RETICCTPCT in the last 72 hours. Urine analysis:    Component Value Date/Time   COLORURINE YELLOW 10/24/2018 1424   APPEARANCEUR CLEAR 10/24/2018 1424   LABSPEC 1.014 10/24/2018 1424   PHURINE 5.0 10/24/2018 1424   GLUCOSEU >=500 (A) 10/24/2018 1424   HGBUR MODERATE (A) 10/24/2018 1424   BILIRUBINUR NEGATIVE 10/24/2018 1424   KETONESUR NEGATIVE 10/24/2018 1424   PROTEINUR >=300 (A) 10/24/2018 1424   UROBILINOGEN 0.2 06/07/2015 1353   NITRITE NEGATIVE 10/24/2018 1424   LEUKOCYTESUR NEGATIVE 10/24/2018 Valier M.D. PhD FACP [facility] 09/30/2020, 3:21 PM   Call night coverage person covering after 7pm

## 2020-10-01 DIAGNOSIS — Z789 Other specified health status: Secondary | ICD-10-CM | POA: Diagnosis not present

## 2020-10-01 LAB — CBC
HCT: 35.3 % — ABNORMAL LOW (ref 39.0–52.0)
Hemoglobin: 12 g/dL — ABNORMAL LOW (ref 13.0–17.0)
MCH: 34.1 pg — ABNORMAL HIGH (ref 26.0–34.0)
MCHC: 34 g/dL (ref 30.0–36.0)
MCV: 100.3 fL — ABNORMAL HIGH (ref 80.0–100.0)
Platelets: 128 10*3/uL — ABNORMAL LOW (ref 150–400)
RBC: 3.52 MIL/uL — ABNORMAL LOW (ref 4.22–5.81)
RDW: 13.1 % (ref 11.5–15.5)
WBC: 6 10*3/uL (ref 4.0–10.5)
nRBC: 0 % (ref 0.0–0.2)

## 2020-10-01 LAB — GLUCOSE, CAPILLARY
Glucose-Capillary: 113 mg/dL — ABNORMAL HIGH (ref 70–99)
Glucose-Capillary: 122 mg/dL — ABNORMAL HIGH (ref 70–99)
Glucose-Capillary: 101 mg/dL — ABNORMAL HIGH (ref 70–99)
Glucose-Capillary: 212 mg/dL — ABNORMAL HIGH (ref 70–99)
Glucose-Capillary: 149 mg/dL — ABNORMAL HIGH (ref 70–99)

## 2020-10-01 LAB — RENAL FUNCTION PANEL
Albumin: 3.2 g/dL — ABNORMAL LOW (ref 3.5–5.0)
Anion gap: 16 — ABNORMAL HIGH (ref 5–15)
BUN: 85 mg/dL — ABNORMAL HIGH (ref 6–20)
CO2: 20 mmol/L — ABNORMAL LOW (ref 22–32)
Calcium: 9.2 mg/dL (ref 8.9–10.3)
Chloride: 97 mmol/L — ABNORMAL LOW (ref 98–111)
Creatinine, Ser: 9.42 mg/dL — ABNORMAL HIGH (ref 0.61–1.24)
GFR, Estimated: 6 mL/min — ABNORMAL LOW (ref >60)
Glucose, Bld: 133 mg/dL — ABNORMAL HIGH (ref 70–99)
Phosphorus: 4.3 mg/dL (ref 2.5–4.6)
Potassium: 3.1 mmol/L — ABNORMAL LOW (ref 3.5–5.1)
Sodium: 133 mmol/L — ABNORMAL LOW (ref 135–145)

## 2020-10-01 LAB — MAGNESIUM: Magnesium: 2.4 mg/dL (ref 1.7–2.4)

## 2020-10-01 NOTE — Progress Notes (Addendum)
50 year old male with history of end-stage renal disease, bipolar disorder, depression, CHF, hypertension, seizure disorder, type 2 diabetes on hemodialysis was admitted for acute encephalopathy, 09/17/2020.  The patient has had ongoing problems with confusion.  Has been followed by physical therapy and has not progressed in terms of mobility and is still requiring +2 mod to max assist for transfers and standing.  PT is recommending skilled nursing facility.  Based on review, do not feel patient would progress in CIR setting.  Would re evaluate if PT is able to document significant session to session improvements.
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
## 2020-10-01 NOTE — Plan of Care (Signed)

## 2020-10-01 NOTE — Progress Notes (Signed)
PT Cancellation Note  Patient Details Name: Timothy Clark MRN: [id number] DOB: 03-Jan-1971   Cancelled Treatment:    Reason Eval/Treat Not Completed: Patient at procedure or test/unavailable this morning. Will follow back as time/schedule allows.   Karma Ganja, PT, DPT   Acute Rehabilitation Department Pager #: [phone]   Otho Bellows 10/01/2020, 8:15 AM
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
## 2020-10-01 NOTE — Progress Notes (Addendum)
    BRIEF OVERNIGHT PROGRESS REPORT   SUBJECTIVE: Notified by the primary RN that patient had unwitnessed fall and was found face down on the floor. Per RN, patient stated he was feeling "lonely" and wanted to get up. He was noted to be alert and oriented to person and time unclear about situation. No other focal neurological deficit or head trauma noted other than c/o knee pain.  OBJECTIVE: Per assessment,  he was afebrile with blood pressure 108/67 mm Hg and pulse rate 85 beats/min. There were no focal neurological deficits; he was alert and oriented x3, and he did not demonstrate any memory deficits.   ASSESSMENT & PLAN:  Acute metabolic encephalopathy with suicidal and homicidal attempt now complicated by fall with suspected head injury. -Hold Heparin for now -Neurochecks -Avoid sedating medication due to risk of masking acute neurological changes -Hold off imaging for now however will consider STAT non-contrast CT head if change in neurological status noted from baseline.     Rufina Falco, DNP, CCRN, FNP-C, AGACNP-BC [facility] Nurse Practitioner Between 7pm to 7am - Pager [phone]  After 7am go to [email or web address] - [email or web address] [facility]  [facility] [employer]  [phone]
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
## 2020-10-01 NOTE — Progress Notes (Signed)
Inpatient Rehabilitation Admissions Coordinator  Asked by [facility] to place rehab consult order for Rehab MD to assess for appropriateness for CIR admit.  Danne Baxter, RN, MSN Rehab Admissions Coordinator [phone] 10/01/2020 1:16 PM

## 2020-10-01 NOTE — Progress Notes (Signed)
Pt to dialysis via bed.

## 2020-10-01 NOTE — Progress Notes (Signed)
Inpatient Rehab Admissions Coordinator:   Pt. Screened by Rehab MD who felt that pt  Would not benefit from CIR admission. I will not pursue CIR at this time. I have notified the patient that he is not being considered for CIR admit.Clemens Catholic, [place], [place] Admissions Coordinator  [phone] ([place]) [phone] (office)

## 2020-10-02 ENCOUNTER — Inpatient Hospital Stay (HOSPITAL_COMMUNITY): Payer: Medicare HMO

## 2020-10-02 DIAGNOSIS — Z789 Other specified health status: Secondary | ICD-10-CM | POA: Diagnosis not present

## 2020-10-02 LAB — GLUCOSE, CAPILLARY
Glucose-Capillary: 118 mg/dL — ABNORMAL HIGH (ref 70–99)
Glucose-Capillary: 140 mg/dL — ABNORMAL HIGH (ref 70–99)
Glucose-Capillary: 140 mg/dL — ABNORMAL HIGH (ref 70–99)
Glucose-Capillary: 174 mg/dL — ABNORMAL HIGH (ref 70–99)

## 2020-10-02 LAB — RENAL FUNCTION PANEL
Albumin: 3.2 g/dL — ABNORMAL LOW (ref 3.5–5.0)
Anion gap: 15 (ref 5–15)
BUN: 61 mg/dL — ABNORMAL HIGH (ref 6–20)
CO2: 23 mmol/L (ref 22–32)
Calcium: 9 mg/dL (ref 8.9–10.3)
Chloride: 94 mmol/L — ABNORMAL LOW (ref 98–111)
Creatinine, Ser: 7.97 mg/dL — ABNORMAL HIGH (ref 0.61–1.24)
GFR, Estimated: 8 mL/min — ABNORMAL LOW (ref >60)
Glucose, Bld: 131 mg/dL — ABNORMAL HIGH (ref 70–99)
Phosphorus: 4.1 mg/dL (ref 2.5–4.6)
Potassium: 3 mmol/L — ABNORMAL LOW (ref 3.5–5.1)
Sodium: 132 mmol/L — ABNORMAL LOW (ref 135–145)

## 2020-10-02 MED ORDER — CHLORHEXIDINE GLUCONATE CLOTH 2 % EX PADS
6.0000 | MEDICATED_PAD | Freq: Every day | CUTANEOUS | Status: DC
  Administered 2020-10-04 – 2020-10-05 (×2): 6 via TOPICAL

## 2020-10-02 MED ORDER — POTASSIUM CHLORIDE CRYS ER 20 MEQ PO TBCR
20.0000 meq | EXTENDED_RELEASE_TABLET | Freq: Once | ORAL | Status: AC
  Administered 2020-10-02: 20 meq via ORAL
  Filled 2020-10-02: qty 1
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
## 2020-10-02 NOTE — Progress Notes (Addendum)
Physical Therapy Treatment Patient Details Name: Timothy Clark MRN: [id number] DOB: 1970/10/17 Today's Date: 10/02/2020    History of Present Illness The pt is a 50 yo male presenting with confusion, demonstrating self-harm and acting violently towards his family. Pt found to have acute metabolic encephalopathy. Pt had fall on morning of 10/02/20 - CT head was negative.  He later c/o L knee pain - imaging pending.  Pt with difficult discharge plan/placement and may have to go home with family.   PMH includes: ESRD on HD MWF, seizures, HTN, DM II, CHF, depression, bipolar disorder.    PT Comments    Pt with fall this morning.  Since fall he had ambulated with nursing to bathroom with assist of 1.  However, later c/o knee pain and had xray.  Results are pending so performed limited therapy session that limited weight bearing on L LE , AROM on knee exercises, and limited knee flexion.  Pt required increased time, cues for sequence, but with good participation and tolerated well with little to no c/o pain in L knee.  Pt was not able to get OOB with PT due to pending imaging but did walk earlier with assist of 1 which is progress from prior PT session.  Noted pt is difficult to place in facility and may be returning home with family - needs continued therapy to progress to this level.      Follow Up Recommendations  SNF     Equipment Recommendations  Rolling walker with 5" wheels;Wheelchair (measurements PT);Wheelchair cushion (measurements PT);3in1 (PT)    Recommendations for Other Services       Precautions / Restrictions Precautions Precautions: Fall Precaution Comments: pt is blind    Mobility  Bed Mobility Overal bed mobility: Needs Assistance Bed Mobility: Supine to Sit;Sit to Supine     Supine to sit: Min assist Sit to supine: Min assist   General bed mobility comments: Min A , cues and increased time    Transfers Overall transfer level: Needs assistance   Transfers:  Lateral/Scoot Transfers          Lateral/Scoot Transfers: Min guard General transfer comment: Lateral scoot toward HOB with min guard safety ; did not attempt stand due to L Knee xray pending  Ambulation/Gait                 Stairs             Wheelchair Mobility    Modified Rankin (Stroke Patients Only)       Balance Overall balance assessment: Needs assistance Sitting-balance support: Feet supported;No upper extremity supported Sitting balance-Leahy Scale: Good                                      Cognition Arousal/Alertness: Awake/alert Behavior During Therapy: Flat affect Overall Cognitive Status: Impaired/Different from baseline Area of Impairment: Attention;Memory;Following commands;Safety/judgement;Awareness;Problem solving;Orientation                 Orientation Level: Disoriented to;Time Current Attention Level: Focused Memory: Decreased short-term memory;Decreased recall of precautions Following Commands: Follows one step commands with increased time;Follows one step commands inconsistently Safety/Judgement: Decreased awareness of safety;Decreased awareness of deficits Awareness: Intellectual Problem Solving: Slow processing;Decreased initiation;Difficulty sequencing;Requires verbal cues;Requires tactile cues        Exercises General Exercises - Lower Extremity Ankle Circles/Pumps: AROM;Both;Supine;20 reps;Seated (10 supine, 10 seated; required tactile cues) Quad Sets: AROM;Right;10 reps;Supine (  painful on L did not perform) Gluteal Sets: AROM;Both;5 reps;Supine Long Arc Quad: AROM;20 reps;Right;Seated Heel Slides: AROM;Right;10 reps;Supine (with mod resistance for ext) Hip ABduction/ADduction: AROM;Supine;10 reps;Both (with mod resistance on R, AROM on L) Straight Leg Raises: AROM;Right;10 reps;Supine Hip Flexion/Marching: AROM;Both;10 reps;Seated    General Comments General comments (skin integrity, edema, etc.): Pt  with fall this morning.  Since fall he had ambulated with nursing to bathroom with assist of 1.  However, later c/o knee pain and had xray.  REsults are pending so performed limited therapy session.      Pertinent Vitals/Pain Pain Assessment: Faces Faces Pain Scale: Hurts a little bit Pain Location: L knee - with palpation just distal to patella Pain Descriptors / Indicators: Discomfort Pain Intervention(s): Limited activity within patient's tolerance;Monitored during session    Home Living                      Prior Function            PT Goals (current goals can now be found in the care plan section) Acute Rehab PT Goals Patient Stated Goal: sit in chair PT Goal Formulation: With patient Time For Goal Achievement: 10/05/20 Potential to Achieve Goals: Good Progress towards PT goals: Progressing toward goals    Frequency    Min 4X/week      PT Plan Discharge plan needs to be updated (noted CIR denied per notes, updated to SNF)    Co-evaluation              AM-PAC PT "6 Clicks" Mobility   Outcome Measure  Help needed turning from your back to your side while in a flat bed without using bedrails?: A Little Help needed moving from lying on your back to sitting on the side of a flat bed without using bedrails?: A Little Help needed moving to and from a bed to a chair (including a wheelchair)?: A Lot Help needed standing up from a chair using your arms (e.g., wheelchair or bedside chair)?: A Lot Help needed to walk in hospital room?: A Lot Help needed climbing 3-5 steps with a railing? : A Lot 6 Click Score: 14    End of Session   Activity Tolerance: Other (comment) (Tolerated well but limited due to imaging pending) Patient left: with call bell/phone within reach;in bed;with bed alarm set Nurse Communication: Mobility status PT Visit Diagnosis: Unsteadiness on feet (R26.81);Other abnormalities of gait and mobility (R26.89)     Time: [id number] PT  Time Calculation (min) (ACUTE ONLY): 25 min  Charges:  $Therapeutic Exercise: 8-22 mins                     Abran Richard, PT Acute Rehab Services Pager [phone] [facility] Rehab [phone]     Karlton Lemon 10/02/2020, 5:25 PM

## 2020-10-02 NOTE — Progress Notes (Signed)
[facility] ROUNDING NOTE   Subjective:   noted per charting found face down in floor on 2/21.  He has been agitated.  They have been in communication with primary team.  Had HD on 2/21 with 1.8 kg UF.  Patient denies any dizziness.  Spoke with RN - she's in room with him now as well.  Patient felt good after HD yesterday  Review of systems  Denies chest pain  Denies n/v Denies shortness of breath Visually impaired  -------------------- Brief history: This is a 50 year old gentleman with a history of diabetes and end-stage renal disease Monday Wednesday Friday dialysis [place].  He also has a history of congestive heart failure with diastolic dysfunction.  He was brought to the emergency room with a change in behavior.  Violent activity towards his family members.   Objective:  Vital signs in last 24 hours:  Temp:  [96.7 F (35.9 C)-98.4 F (36.9 C)] 98 F (36.7 C) (02/22 0713) Pulse Rate:  [69-90] 69 (02/22 0713) Resp:  [13-25] 13 (02/22 0713) BP: (98-128)/(55-83) 116/73 (02/22 0713) SpO2:  [96 %-100 %] 100 % (02/22 0713) Weight:  [108.8 kg] 108.8 kg (02/21 1133)  Weight change:  Filed Weights   09/28/20 1114 10/01/20 0816 10/01/20 1133  Weight: 110.4 kg 110.4 kg 108.8 kg    Intake/Output: I/O last 3 completed shifts: In: 300 [P.O.:300] Out: 1763 [Other:1763]   Intake/Output this shift:  Total I/O In: 270 [P.O.:270] Out: -   General adult male in bed in no acute distress; visually impaired   HEENT normocephalic atraumatic extraocular movements intact sclera anicteric Neck supple trachea midline Lungs clear to auscultation bilaterally normal work of breathing at rest  Heart S1S2 no rub Abdomen soft nontender nondistended Extremities no edema  Psych normal mood and affect Access - AVG left with bruit and thrill   Basic Metabolic Panel: Recent Labs  Lab 09/27/20 0233 09/28/20 0800 09/30/20 0407 10/01/20 0354 10/02/20 0258  NA 135 130* 133* 133*  132*  K 4.2 2.7* 3.1* 3.1* 3.0*  CL 97* 93* 95* 97* 94*  CO2 21* 22 18* 20* 23  GLUCOSE 101* 120* 93 133* 131*  BUN 51* 62* 68* 85* 61*  CREATININE 7.96* 7.97* 8.87* 9.42* 7.97*  CALCIUM 9.5 8.8* 9.3 9.2 9.0  MG  --   --  2.4 2.4  --   PHOS  --  3.6 4.1 4.3 4.1    Liver Function Tests: Recent Labs  Lab 09/28/20 0800 09/30/20 0407 10/01/20 0354 10/02/20 0258  ALBUMIN 3.1* 3.3* 3.2* 3.2*   No results for input(s): LIPASE, AMYLASE in the last 168 hours. Recent Labs  Lab 09/25/20 2215  AMMONIA 30    CBC: Recent Labs  Lab 09/25/20 2330 09/26/20 0500 09/27/20 0233 09/28/20 0202 10/01/20 0354  WBC 5.5 6.0 7.8 8.5 6.0  HGB 12.7* 12.4* 14.2 12.7* 12.0*  HCT 35.3* 33.7* 39.6 35.0* 35.3*  MCV 98.1 97.4 97.5 97.8 100.3*  PLT 133* 143* 131* 120* 128*    Cardiac Enzymes: No results for input(s): CKTOTAL, CKMB, CKMBINDEX, TROPONINI in the last 168 hours.  BNP: Invalid input(s): POCBNP  CBG: Recent Labs  Lab 10/01/20 1041 10/01/20 1213 10/01/20 1615 10/01/20 2147 10/02/20 0747  GLUCAP 122* 101* 212* 149* 118*    Microbiology: Results for orders placed or performed during the hospital encounter of 09/17/20  SARS Coronavirus 2 by RT PCR (hospital order, performed in [facility] hospital lab) Nasopharyngeal Nasopharyngeal Swab     Status: None  Collection Time: 09/17/20  2:13 PM   Specimen: Nasopharyngeal Swab  Result Value Ref Range Status   SARS Coronavirus 2 NEGATIVE NEGATIVE Final    Comment: (NOTE) SARS-CoV-2 target nucleic acids are NOT DETECTED.  The SARS-CoV-2 RNA is generally detectable in upper and lower respiratory specimens during the acute phase of infection. The lowest concentration of SARS-CoV-2 viral copies this assay can detect is 250 copies / mL. A negative result does not preclude SARS-CoV-2 infection and should not be used as the sole basis for treatment or other patient management decisions.  A negative result may occur with improper  specimen collection / handling, submission of specimen other than nasopharyngeal swab, presence of viral mutation(s) within the areas targeted by this assay, and inadequate number of viral copies (<250 copies / mL). A negative result must be combined with clinical observations, patient history, and epidemiological information.  Fact Sheet for Patients:   StrictlyIdeas.no  Fact Sheet for Healthcare Providers: BankingDealers.co.za  This test is not yet approved or  cleared by the [place] FDA and has been authorized for detection and/or diagnosis of SARS-CoV-2 by FDA under an Emergency Use Authorization (EUA).  This EUA will remain in effect (meaning this test can be used) for the duration of the COVID-19 declaration under Section 564(b)(1) of the Act, 21 U.S.C. section 360bbb-3(b)(1), unless the authorization is terminated or revoked sooner.  Performed at [facility], [address], [place], [place] [address]   MRSA PCR Screening     Status: None   Collection Time: 09/18/20  4:57 PM   Specimen: Nasal Mucosa; Nasopharyngeal  Result Value Ref Range Status   MRSA by PCR NEGATIVE NEGATIVE Final    Comment:        The GeneXpert MRSA Assay (FDA approved for NASAL specimens only), is one component of a comprehensive MRSA colonization surveillance program. It is not intended to diagnose MRSA infection nor to guide or monitor treatment for MRSA infections. Performed at [facility], [place] [address], [place], [place] [address]   Resp Panel by RT-PCR (Flu A&B, Covid) Nasopharyngeal Swab     Status: None   Collection Time: 09/22/20  8:41 AM   Specimen: Nasopharyngeal Swab; Nasopharyngeal(NP) swabs in vial transport medium  Result Value Ref Range Status   SARS Coronavirus 2 by RT PCR NEGATIVE NEGATIVE Final    Comment: (NOTE) SARS-CoV-2 target nucleic acids are NOT DETECTED.  The SARS-CoV-2 RNA is generally detectable in upper  respiratory specimens during the acute phase of infection. The lowest concentration of SARS-CoV-2 viral copies this assay can detect is 138 copies/mL. A negative result does not preclude SARS-Cov-2 infection and should not be used as the sole basis for treatment or other patient management decisions. A negative result may occur with  improper specimen collection/handling, submission of specimen other than nasopharyngeal swab, presence of viral mutation(s) within the areas targeted by this assay, and inadequate number of viral copies(<138 copies/mL). A negative result must be combined with clinical observations, patient history, and epidemiological information. The expected result is Negative.  Fact Sheet for Patients:  EntrepreneurPulse.com.au  Fact Sheet for Healthcare Providers:  IncredibleEmployment.be  This test is no t yet approved or cleared by the [place] FDA and  has been authorized for detection and/or diagnosis of SARS-CoV-2 by FDA under an Emergency Use Authorization (EUA). This EUA will remain  in effect (meaning this test can be used) for the duration of the COVID-19 declaration under Section 564(b)(1) of the Act, 21  U.S.C.section 360bbb-3(b)(1), unless the authorization is terminated  or revoked sooner.       Influenza A by PCR NEGATIVE NEGATIVE Final   Influenza B by PCR NEGATIVE NEGATIVE Final    Comment: (NOTE) The Xpert Xpress SARS-CoV-2/FLU/RSV plus assay is intended as an aid in the diagnosis of influenza from Nasopharyngeal swab specimens and should not be used as a sole basis for treatment. Nasal washings and aspirates are unacceptable for Xpert Xpress SARS-CoV-2/FLU/RSV testing.  Fact Sheet for Patients: EntrepreneurPulse.com.au  Fact Sheet for Healthcare Providers: IncredibleEmployment.be  This test is not yet approved or cleared by the [place] FDA and has been  authorized for detection and/or diagnosis of SARS-CoV-2 by FDA under an Emergency Use Authorization (EUA). This EUA will remain in effect (meaning this test can be used) for the duration of the COVID-19 declaration under Section 564(b)(1) of the Act, 21 U.S.C. section 360bbb-3(b)(1), unless the authorization is terminated or revoked.  Performed at [facility], [place] [address] [place], [place] [address]     Imaging: No results found.   Medications:    . amLODipine  10 mg Oral Daily  . atorvastatin  10 mg Oral QHS  . Chlorhexidine Gluconate Cloth  6 each Topical Q0600  . dorzolamide-timolol  1 drop Both Eyes BID  . folic acid  1 mg Oral Daily  . heparin injection (subcutaneous)  5,000 Units Subcutaneous Q8H  . insulin aspart  0-15 Units Subcutaneous Q4H  . metoCLOPramide  5 mg Oral TID AC  . pantoprazole  40 mg Oral BID AC  . polyethylene glycol  17 g Oral Daily  . senna-docusate  1 tablet Oral BID  . sucralfate  1 g Oral QID  . valproic acid  750 mg Oral TID  . ziprasidone  40 mg Oral QPM   And  . ziprasidone  20 mg Oral Q breakfast   acetaminophen **OR** acetaminophen, hydrALAZINE, labetalol, LORazepam, polyethylene glycol, sorbitol  Assessment/ Plan:  1.Clotted/thrombosed left upper extremity AV graft: Status post declot of AV graft by IR on 2/9, access working well.  2. ESRD:MWF at [facility]: Continue dialysis Monday Wednesday Friday schedule.  3Hypertension: controlled on current regimen  4.Anemia of ESRD:Hemoglobin at goal. Aranesp on hold  5. Metabolic Bone Disease: Calcium and phosphorus level acceptable.  6. Hypokalemia: 3K bath.  Potassium 64mq po once now  7. Acute metabolic encephalopathy, suicidal and homicidal attempt with history of bipolar mood disorder: Seen by neurology.  Per primary team.  Disposition per primary team   LClaudia Desanctis MD 10/02/2020 10:15 AM

## 2020-10-02 NOTE — Progress Notes (Signed)
1950- Pt found face down on floor in room. Hoyer lift and staff assistance x3 needed to get him back into bed. Pt questioned as to why he got up unassisted, pt states "I was getting up cause I was depressed and needed you to give me something." Pt A&O to person and time, but is easily re-orientated to place and situation. Complains of new left knee pain. 2000- head to toe assessment completed. Jonny Ruiz, NP paged, awaiting response. This RN remains at bedside and pt needs constant re-direction to not get oob. Pt states "I need something for anxiety" and "hello? i'm blind don't leave me" reassurance and emotional support provided. 2012- call received from [facility], NP. Received new order to hold 2200 scheduled SQ heparin, perform frequent neuro checks, and notify NP if any neurological changes are present. This RN notifies NP that pt is complaining of anxiety and needing continuous re-direction to stay in bed. NP states to hold anxiolytic drugs to avoid masking neurological changes. NP also gives order for 1:1 safety sitter and states if a sitter is not available, to apply posey belt restraint. 2030- Pt states "call my sister [name]" [name] called and notified of fall and pt status. Phone given to pt, pt speaks into phone and says to sister "I jumped over the side rail and landed straight on my face" and then "I was depressed and looking for somebody." 2032- charge nurse notifies this RN that safety sitter is not available. Pt continues attempting to get oob and needing reassurance. Soft posey belt applied [id number] re-assessment completed, see flowsheet. no changes from baseline noted. Pt still restless, posey belt continued. 0235- re-assessment completed, see flowsheet. No changes from baseline noted. Pt calm and resting in bed, A&O x4. Pt agreeable to using call bell. This RN asks pt "what do you have to do if you need something" pt replies "call you" this RN asks "and then what?" Pt says "and wait for you to come."  This RN asks pt "are you going to try and get up by yourself?" Pt says "no. Call don't fall." posey belt removed and restraints discontinued. 0504- re-assessment completed, see flowsheet. No changes from baseline evident. Heparin given as ordered in eMAR. Pt reminded to use call bell when assistance is needed, pt agrees.

## 2020-10-02 NOTE — Consult Note (Signed)
  50 year old male with history of DM type 2 with gastroparesis, hypertension, ESRD on HD MWF, diastolic CHF, bipolar disorder was brought tothe hospital due to agitation,confusionandacting violently towards his family.  Patient with extended length of stay all 14 days, who is currently being managed with Depakote DR 750 mg p.o. 3 times daily and Geodon 20 mg p.o. every morning and Geodon 40 mg p.o. nightly.  Patient appears to be improving from a psychiatric standpoint.  He has not displayed any behavioral disturbances, agitation, aggression, and or irritability.  Psychiatric consult place for delirium, confusion, anxiety during the night, medication management, and sister inquiring about monthly injection.   Patient seen and case discussed with Dr. Dwyane Dee.  Patient was observed to be alert and oriented, calm and cooperative, very pleasant.  He appears to be responding to treatment at this current time, and is able to follow directions well.  He appears to be compliant with his medication regimen.  He denies any current anxiety, agitation, and or irritability.  Per nursing staff patient did become confused last night, and made an attempt to get out of bed in order to seek assistance for medication.  As a result patient did fall, stat CT scan of head was ordered, and x-ray of knee for follow-up management.   Writer also contacted his Charissa Bash [phone], who requested patient needs something for anxiety.  She reports his fall last night was related to his anxiety.  She also inquires about increasing his Geodon to help manage his anxiety.  She then inquires about him getting a shot for his mood.  Sister was reminded about medication management while in the hospital, previously she had concerns about him becoming more aggressive, violent, and not acting as his self.  Patient appears to be much more improve at this time and he does not appear to be visibly anxious and or restless.  Discussed out of  her concerns, medical staff, and patient concerns w will recommend we continue his current medication at this time.  At this time no further adjustments will be made to his Depakote and or Geodon at this time, as noted patient continues to respond appropriately, no longer overtly sedated, and is now able to participate in therapy and communicate his request and concerns.  We will add buspirone 10 mg p.o. twice daily to target anxiety.   -Continue current medication regimen, will not adjust at this time.  Patient has required multiple medication adjustments while inpatient, appears to be responding appropriately to current therapy and medication regimen. -Will initiate buspirone 10 mg p.o. twice daily to further target anxiety.  Would like to refrain from use of benzodiazepines at this time. -Continue fall risk precautions. -Psychiatry to sign off.

## 2020-10-02 NOTE — Plan of Care (Signed)
Patient's mobility and mental state improving. Patient able to get up and ambulate to bathroom with walker and minimum assist, needs more help to guide around the room since he cannot see and to lift from low seats.  Patient alert & oriented x4 this morning and very anxious to go home.   Problem: Education: Goal: Knowledge of General Education information will improve Description: Including pain rating scale, medication(s)/side effects and non-pharmacologic comfort measures Outcome: Progressing   Problem: Health Behavior/Discharge Planning: Goal: Ability to manage health-related needs will improve Outcome: Progressing   Problem: Clinical Measurements: Goal: Ability to maintain clinical measurements within normal limits will improve Outcome: Progressing Goal: Will remain free from infection Outcome: Progressing Goal: Diagnostic test results will improve Outcome: Progressing Goal: Respiratory complications will improve Outcome: Progressing Goal: Cardiovascular complication will be avoided Outcome: Progressing   Problem: Activity: Goal: Risk for activity intolerance will decrease Outcome: Progressing   Problem: Nutrition: Goal: Adequate nutrition will be maintained Outcome: Progressing   Problem: Coping: Goal: Level of anxiety will decrease Outcome: Progressing   Problem: Elimination: Goal: Will not experience complications related to bowel motility Outcome: Progressing Goal: Will not experience complications related to urinary retention Outcome: Progressing   Problem: Pain Managment: Goal: General experience of comfort will improve Outcome: Progressing   Problem: Safety: Goal: Ability to remain free from injury will improve Outcome: Progressing   Problem: Skin Integrity: Goal: Risk for impaired skin integrity will decrease Outcome: Progressing

## 2020-10-03 DIAGNOSIS — Z789 Other specified health status: Secondary | ICD-10-CM | POA: Diagnosis not present

## 2020-10-03 LAB — CBC
HCT: 35.7 % — ABNORMAL LOW (ref 39.0–52.0)
Hemoglobin: 12.5 g/dL — ABNORMAL LOW (ref 13.0–17.0)
MCH: 34.9 pg — ABNORMAL HIGH (ref 26.0–34.0)
MCHC: 35 g/dL (ref 30.0–36.0)
MCV: 99.7 fL (ref 80.0–100.0)
Platelets: 116 10*3/uL — ABNORMAL LOW (ref 150–400)
RBC: 3.58 MIL/uL — ABNORMAL LOW (ref 4.22–5.81)
RDW: 13.1 % (ref 11.5–15.5)
WBC: 6.5 10*3/uL (ref 4.0–10.5)
nRBC: 0 % (ref 0.0–0.2)

## 2020-10-03 LAB — GLUCOSE, CAPILLARY
Glucose-Capillary: 172 mg/dL — ABNORMAL HIGH (ref 70–99)
Glucose-Capillary: 159 mg/dL — ABNORMAL HIGH (ref 70–99)
Glucose-Capillary: 136 mg/dL — ABNORMAL HIGH (ref 70–99)
Glucose-Capillary: 220 mg/dL — ABNORMAL HIGH (ref 70–99)

## 2020-10-03 LAB — RENAL FUNCTION PANEL
Albumin: 3.1 g/dL — ABNORMAL LOW (ref 3.5–5.0)
Anion gap: 20 — ABNORMAL HIGH (ref 5–15)
BUN: 90 mg/dL — ABNORMAL HIGH (ref 6–20)
CO2: 18 mmol/L — ABNORMAL LOW (ref 22–32)
Calcium: 9 mg/dL (ref 8.9–10.3)
Chloride: 94 mmol/L — ABNORMAL LOW (ref 98–111)
Creatinine, Ser: 9.06 mg/dL — ABNORMAL HIGH (ref 0.61–1.24)
GFR, Estimated: 7 mL/min — ABNORMAL LOW (ref >60)
Glucose, Bld: 167 mg/dL — ABNORMAL HIGH (ref 70–99)
Phosphorus: 3.7 mg/dL (ref 2.5–4.6)
Potassium: 3.2 mmol/L — ABNORMAL LOW (ref 3.5–5.1)
Sodium: 132 mmol/L — ABNORMAL LOW (ref 135–145)

## 2020-10-03 MED ORDER — POTASSIUM CHLORIDE CRYS ER 20 MEQ PO TBCR
20.0000 meq | EXTENDED_RELEASE_TABLET | Freq: Once | ORAL | Status: AC
  Administered 2020-10-03: 20 meq via ORAL
  Filled 2020-10-03: qty 1

## 2020-10-03 MED ORDER — BUSPIRONE HCL 5 MG PO TABS
5.0000 mg | ORAL_TABLET | Freq: Two times a day (BID) | ORAL | Status: DC
  Administered 2020-10-03 – 2020-10-05 (×5): 5 mg via ORAL
  Filled 2020-10-03 (×5): qty 1
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
## 2020-10-03 NOTE — Progress Notes (Signed)
Patient awake and alert this morning.  A&Ox4.  Some breakfast given and patient transferred to dialysis via bed.  NAD.

## 2020-10-03 NOTE — Progress Notes (Signed)
Pt complaining of anxiety and inabilitiy to sleep stating "I have a lot on my mind, how a person can go from walking one day to not being able to the next." and requesting anxiety medicine. Emotional support given and ativan administered, see eMAR.

## 2020-10-03 NOTE — Progress Notes (Signed)
Patient was only able to sit up in the chair for about 30 minutes.  Then he became increasingly anxious and was trying to get up.  Patient assisted back to bed with a max assist of 2 people.  Patient cleaned up of incontinent smears of stool and cream applied to buttocks.  After cleaned up and repositioned in bed, patient still very anxious and keeps stating, "I'm about to go crazy."  Unable to console patient.  Ativan given as ordered and patient now resting quietly.  Bed exit alarm on, floor mats in place, and call light in reach.
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
## 2020-10-03 NOTE — Progress Notes (Signed)
Patient had chills for some time after coming back from dialysis, otherwise, no changes in assessment.  However, patient states, "I don't feel good."  Vitals checked and stable.  Blood sugar 136.  Patient ate a late lunch.  Denies any specific complaints.  Up in chair with therapy, hoyer pad underneath patient as he is not moving as well as he has the past two afternoons.  Chair exit alarm on and call light in reach.  Will continue to monitor.
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
## 2020-10-03 NOTE — Plan of Care (Signed)

## 2020-10-04 ENCOUNTER — Encounter (INDEPENDENT_AMBULATORY_CARE_PROVIDER_SITE_OTHER): Payer: Medicare HMO | Admitting: Ophthalmology

## 2020-10-04 DIAGNOSIS — Z789 Other specified health status: Secondary | ICD-10-CM | POA: Diagnosis not present

## 2020-10-04 LAB — GLUCOSE, CAPILLARY
Glucose-Capillary: 136 mg/dL — ABNORMAL HIGH (ref 70–99)
Glucose-Capillary: 89 mg/dL (ref 70–99)
Glucose-Capillary: 186 mg/dL — ABNORMAL HIGH (ref 70–99)
Glucose-Capillary: 123 mg/dL — ABNORMAL HIGH (ref 70–99)
Glucose-Capillary: 136 mg/dL — ABNORMAL HIGH (ref 70–99)
Glucose-Capillary: 192 mg/dL — ABNORMAL HIGH (ref 70–99)

## 2020-10-04 MED ORDER — CHLORHEXIDINE GLUCONATE CLOTH 2 % EX PADS: 6.0000 | MEDICATED_PAD | Freq: Every day | CUTANEOUS | Status: DC

## 2020-10-04 MED ORDER — AMLODIPINE BESYLATE 5 MG PO TABS
5.0000 mg | ORAL_TABLET | Freq: Every day | ORAL | Status: DC
  Administered 2020-10-05 – 2020-10-11 (×7): 5 mg via ORAL
  Filled 2020-10-04 (×7): qty 1
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
## 2020-10-04 NOTE — Progress Notes (Signed)
Patient was fed a honeybun and 120cc of water; pt ate then vomitted back up undigested food and water from his mouth.
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
## 2020-10-04 NOTE — Plan of Care (Signed)
  Problem: Education: Goal: Knowledge of General Education information will improve Description Including pain rating scale, medication(s)/side effects and non-pharmacologic comfort measures Outcome: Progressing   Problem: Health Behavior/Discharge Planning: Goal: Ability to manage health-related needs will improve Outcome: Progressing   

## 2020-10-04 NOTE — Progress Notes (Signed)
[facility] ROUNDING NOTE   Subjective:   Last HD on 2/23 with 1.5 kg UF. Also with 200 mL uop over 2/23.  He's listening to a program.  States HD going ok.   Review of systems Denies chest pain  Denies n/v Denies shortness of breath Visually impaired  -------------------- Brief history: This is a 50 year old gentleman with a history of diabetes and end-stage renal disease Monday Wednesday Friday dialysis [place].  He also has a history of congestive heart failure with diastolic dysfunction.  He was brought to the emergency room with a change in behavior.  Violent activity towards his family members.   Objective:  Vital signs in last 24 hours:  Temp:  [97.8 F (36.6 C)-101.8 F (38.8 C)] 98.9 F (37.2 C) (02/24 0731) Pulse Rate:  [85-116] 100 (02/24 0731) Resp:  [13-18] 14 (02/24 0731) BP: (89-145)/(57-79) 104/63 (02/24 0731) SpO2:  [98 %-100 %] 100 % (02/24 0731)  Weight change:  Filed Weights   10/01/20 0816 10/01/20 1133 10/03/20 0800  Weight: 110.4 kg 108.8 kg 109.5 kg    Intake/Output: I/O last 3 completed shifts: In: 900 [P.O.:900] Out: 2011 [Urine:500; Other:1511]   Intake/Output this shift:  No intake/output data recorded.  General adult male in bed in no acute distress; visually impaired  HEENT normocephalic atraumatic extraocular movements intact sclera anicteric Neck supple trachea midline Lungs clear to auscultation bilaterally normal work of breathing at rest  Heart S1S2 no rub Abdomen soft nontender nondistended Extremities no edema  Psych normal mood and affect Access - AVG left bruit and thrill   Basic Metabolic Panel: Recent Labs  Lab 09/28/20 0800 09/30/20 0407 10/01/20 0354 10/02/20 0258 10/03/20 0738  NA 130* 133* 133* 132* 132*  K 2.7* 3.1* 3.1* 3.0* 3.2*  CL 93* 95* 97* 94* 94*  CO2 22 18* 20* 23 18*  GLUCOSE 120* 93 133* 131* 167*  BUN 62* 68* 85* 61* 90*  CREATININE 7.97* 8.87* 9.42* 7.97* 9.06*  CALCIUM 8.8* 9.3  9.2 9.0 9.0  MG  --  2.4 2.4  --   --   PHOS 3.6 4.1 4.3 4.1 3.7    Liver Function Tests: Recent Labs  Lab 09/28/20 0800 09/30/20 0407 10/01/20 0354 10/02/20 0258 10/03/20 0738  ALBUMIN 3.1* 3.3* 3.2* 3.2* 3.1*    CBC: Recent Labs  Lab 09/28/20 0202 10/01/20 0354 10/03/20 0738  WBC 8.5 6.0 6.5  HGB 12.7* 12.0* 12.5*  HCT 35.0* 35.3* 35.7*  MCV 97.8 100.3* 99.7  PLT 120* 128* 116*     Imaging: DG Knee 1-2 Views Left  Result Date: 10/02/2020 CLINICAL DATA:  Fall from standing position last night anterior bruising a knee. EXAM: LEFT KNEE - 1-2 VIEW COMPARISON:  None FINDINGS: No sign of fracture or dislocation. No joint effusion. Vascular calcifications in the soft tissues which are otherwise unremarkable. IMPRESSION: No acute osseous abnormality. No joint effusion. Vascular calcifications. Electronically Signed   By: Zetta Bills M.D.   On: 10/02/2020 21:56   CT HEAD WO CONTRAST  Result Date: 10/02/2020 CLINICAL DATA:  Head trauma. EXAM: CT HEAD WITHOUT CONTRAST TECHNIQUE: Contiguous axial images were obtained from the base of the skull through the vertex without intravenous contrast. COMPARISON:  June 13, 2018. FINDINGS: Brain: No evidence of acute infarction, hemorrhage, hydrocephalus, extra-axial collection or mass lesion/mass effect. Mild patchy white matter hypoattenuation, most likely related to chronic microvascular ischemic disease. Vascular: Calcific atherosclerosis. No hyperdense vessel identified. Skull: No acute fracture.  Right parietal scalp scar.  Sinuses/Orbits: Clear sinuses. Bilateral suspected glaucoma shunts or stents. Otherwise, unremarkable orbits. Other: No mastoid effusions. IMPRESSION: No evidence of acute intracranial abnormality. Electronically Signed   By: Margaretha Sheffield MD   On: 10/02/2020 13:33     Medications:    . amLODipine  10 mg Oral Daily  . atorvastatin  10 mg Oral QHS  . busPIRone  5 mg Oral BID  . Chlorhexidine Gluconate Cloth   6 each Topical Q0600  . Chlorhexidine Gluconate Cloth  6 each Topical Q0600  . dorzolamide-timolol  1 drop Both Eyes BID  . folic acid  1 mg Oral Daily  . heparin injection (subcutaneous)  5,000 Units Subcutaneous Q8H  . insulin aspart  0-15 Units Subcutaneous Q4H  . metoCLOPramide  5 mg Oral TID AC  . pantoprazole  40 mg Oral BID AC  . polyethylene glycol  17 g Oral Daily  . senna-docusate  1 tablet Oral BID  . sucralfate  1 g Oral QID  . valproic acid  750 mg Oral TID  . ziprasidone  40 mg Oral QPM   And  . ziprasidone  20 mg Oral Q breakfast   acetaminophen **OR** acetaminophen, hydrALAZINE, labetalol, LORazepam, polyethylene glycol, sorbitol  Assessment/ Plan:  1.Clotted/thrombosed left upper extremity AV graft: Status post declot of AV graft by IR on 2/9, access working well.  2. ESRD:MWF at [facility]:  - Continue dialysis Monday Wednesday Friday schedule. - last labs 2/23 acceptable - renal panel in am  3Hypertension: reduce amlodipine from 10 to 5 mg daily  4.Anemia of ESRD:Hemoglobin at goal. Aranesp on hold.   5. Metabolic Bone Disease: Calcium and phosphorus level acceptable.  6. Hypokalemia: 3K bath.    7. Acute metabolic encephalopathy, suicidal and homicidal attempt with history of bipolar mood disorder: Seen by neurology.  Per primary team.  Disposition per primary team   Claudia Desanctis, MD 10/04/2020 9:30 AM

## 2020-10-04 NOTE — Progress Notes (Signed)
Occupational Therapy Treatment Patient Details Name: Timothy Clark MRN: [id number] DOB: 04-Mar-1971 Today's Date: 10/04/2020    History of present illness The pt is a 50 yo male presenting with confusion, demonstrating self-harm and acting violently towards his family. Pt found to have acute metabolic encephalopathy. Pt had fall on morning of 10/02/20 - CT head was negative.  He later c/o L knee pain - imaging pending.  Pt with difficult discharge plan/placement and may have to go home with family.   PMH includes: ESRD on HD MWF, seizures, HTN, DM II, CHF, depression, bipolar disorder.   OT comments  Upon arrival, pt supine in bed with eye closed. Pt very slow to respond and answering following ~20% of simple commands. Pt requiring Mod-Max A +2 for stand pivot to [facility] and Total A for toilet hygiene. Pt  presenting with poor arousal and requiring Max cues throughout for sequencing, initiation, and termination of tasks. Continue to recommend dc to SNF. Will continue to follow acutely as admitted. Downgrading pt goals.    Follow Up Recommendations  SNF    Equipment Recommendations  Other (comment) (Defer to next venue)    Recommendations for Other Services PT consult    Precautions / Restrictions Precautions Precautions: Fall Precaution Comments: pt is blind       Mobility Bed Mobility Overal bed mobility: Needs Assistance Bed Mobility: Supine to Sit     Supine to sit: Max assist     General bed mobility comments: Max A for bringing BLES towards and then elevate trunk. Pt very slow processing    Transfers Overall transfer level: Needs assistance Equipment used: Rolling walker (2 wheeled) Transfers: Sit to/from [facility] Sit to Stand: Mod assist;Max assist;+2 physical assistance;+2 safety/equipment Stand pivot transfers: Mod assist;Max assist;+2 physical assistance;+2 safety/equipment       General transfer comment: Mod-Max A +2 for power up into standing.  Max tactile and verbal cues for upright posture. Mod-Max A +2 pivot to [facility]; assist for balance nad sequencing    Balance Overall balance assessment: Needs assistance Sitting-balance support: No upper extremity supported;Feet supported Sitting balance-Leahy Scale: Fair Sitting balance - Comments: Min Guard A. Forward lean and leaning on elbows   Standing balance support: Bilateral upper extremity supported;During functional activity Standing balance-Leahy Scale: Poor                             ADL either performed or assessed with clinical judgement   ADL Overall ADL's : Needs assistance/impaired Eating/Feeding: Maximal assistance;Sitting Eating/Feeding Details (indicate cue type and reason): Max support at wrist to bring cup with straw to mouth.                     Toilet Transfer: Maximal assistance;+2 for physical assistance;+2 for safety/equipment;Stand-pivot;BSC Toilet Transfer Details (indicate cue type and reason): Max A +2 for pivot to BSC. Poor sequencing of BLES Toileting- Clothing Manipulation and Hygiene: Total assistance Toileting - Clothing Manipulation Details (indicate cue type and reason): Max A for maintaing standing. Total A for posterior peri care     Functional mobility during ADLs: Maximal assistance;+2 for physical assistance;+2 for safety/equipment;Rolling walker (stand pivot only) General ADL Comments: Pt with poor cognition, coordination, strength, and balance. No intation and poor sequencing     Vision       Perception     Praxis      Cognition Arousal/Alertness: Lethargic Behavior During Therapy: Flat affect Overall  Cognitive Status: Impaired/Different from baseline Area of Impairment: Attention;Memory;Following commands;Safety/judgement;Awareness;Problem solving;Orientation                   Current Attention Level: Focused Memory: Decreased short-term memory;Decreased recall of precautions Following Commands:  Follows one step commands with increased time Safety/Judgement: Decreased awareness of safety;Decreased awareness of deficits Awareness: Intellectual Problem Solving: Slow processing;Decreased initiation;Difficulty sequencing;Requires verbal cues;Requires tactile cues General Comments: Pt very lethargic - almost catatonic like. Pt requiring significant time for following simple commands. Inconsistent following of commands; following only ~20% of commands. Unaware that he needed to have BM (as he was having BM). Pt presenting with poor initiation, sequencing, and termination.        Exercises     Shoulder Instructions       General Comments      Pertinent Vitals/ Pain       Pain Assessment: Faces Faces Pain Scale: Hurts a little bit Pain Location: Generalized Pain Descriptors / Indicators: Discomfort Pain Intervention(s): Monitored during session;Limited activity within patient's tolerance;Repositioned  Home Living                                          Prior Functioning/Environment              Frequency  Min 2X/week        Progress Toward Goals  OT Goals(current goals can now be found in the care plan section)  Progress towards OT goals: Progressing toward goals  Acute Rehab OT Goals Patient Stated Goal: sit in chair OT Goal Formulation: With patient Time For Goal Achievement: 10/18/20 Potential to Achieve Goals: Good ADL Goals Pt Will Perform Grooming: with min guard assist;sitting (Min cues) Pt Will Perform Upper Body Dressing: with min assist;sitting Pt Will Perform Lower Body Dressing: with min assist;sit to/from stand Pt Will Transfer to Toilet: with min assist;ambulating;bedside commode;with +2 assist Pt Will Perform Toileting - Clothing Manipulation and hygiene: with min assist;sitting/lateral leans;sit to/from stand Additional ADL Goal #1: Pt will demonstrate selective attention during ADLs with Min cues  Plan Discharge plan remains  appropriate;Frequency remains appropriate    Co-evaluation    PT/OT/SLP Co-Evaluation/Treatment: Yes Reason for Co-Treatment: For patient/therapist safety;To address functional/ADL transfers   OT goals addressed during session: ADL's and self-care      AM-PAC OT "6 Clicks" Daily Activity     Outcome Measure   Help from another person eating meals?: A Lot Help from another person taking care of personal grooming?: A Lot Help from another person toileting, which includes using toliet, bedpan, or urinal?: A Lot Help from another person bathing (including washing, rinsing, drying)?: A Lot Help from another person to put on and taking off regular upper body clothing?: A Lot Help from another person to put on and taking off regular lower body clothing?: A Lot 6 Click Score: 12    End of Session Equipment Utilized During Treatment: Rolling walker;Gait belt  OT Visit Diagnosis: Unsteadiness on feet (R26.81);Other abnormalities of gait and mobility (R26.89);Muscle weakness (generalized) (M62.81)   Activity Tolerance Patient limited by lethargy   Patient Left in chair;with call bell/phone within reach;with chair alarm set   Nurse Communication Mobility status;Need for lift equipment        Time: 1429-1455 OT Time Calculation (min): 26 min  Charges: OT General Charges $OT Visit: 1 Visit OT Treatments $Self Care/Home Management : 8-22 mins  [place], OTR/L Acute Rehab Pager: [phone] Office: [place] 10/04/2020, 3:35 PM

## 2020-10-05 DIAGNOSIS — Z789 Other specified health status: Secondary | ICD-10-CM | POA: Diagnosis not present

## 2020-10-05 LAB — RENAL FUNCTION PANEL
Albumin: 2.8 g/dL — ABNORMAL LOW (ref 3.5–5.0)
Anion gap: 16 — ABNORMAL HIGH (ref 5–15)
BUN: 88 mg/dL — ABNORMAL HIGH (ref 6–20)
CO2: 23 mmol/L (ref 22–32)
Calcium: 8.9 mg/dL (ref 8.9–10.3)
Chloride: 92 mmol/L — ABNORMAL LOW (ref 98–111)
Creatinine, Ser: 9.01 mg/dL — ABNORMAL HIGH (ref 0.61–1.24)
GFR, Estimated: 7 mL/min — ABNORMAL LOW (ref >60)
Glucose, Bld: 170 mg/dL — ABNORMAL HIGH (ref 70–99)
Phosphorus: 4.1 mg/dL (ref 2.5–4.6)
Potassium: 2.8 mmol/L — ABNORMAL LOW (ref 3.5–5.1)
Sodium: 131 mmol/L — ABNORMAL LOW (ref 135–145)

## 2020-10-05 LAB — CBC
HCT: 30.6 % — ABNORMAL LOW (ref 39.0–52.0)
Hemoglobin: 10.5 g/dL — ABNORMAL LOW (ref 13.0–17.0)
MCH: 34.8 pg — ABNORMAL HIGH (ref 26.0–34.0)
MCHC: 34.3 g/dL (ref 30.0–36.0)
MCV: 101.3 fL — ABNORMAL HIGH (ref 80.0–100.0)
Platelets: 120 10*3/uL — ABNORMAL LOW (ref 150–400)
RBC: 3.02 MIL/uL — ABNORMAL LOW (ref 4.22–5.81)
RDW: 13.3 % (ref 11.5–15.5)
WBC: 7.8 10*3/uL (ref 4.0–10.5)
nRBC: 0 % (ref 0.0–0.2)

## 2020-10-05 LAB — GLUCOSE, CAPILLARY
Glucose-Capillary: 131 mg/dL — ABNORMAL HIGH (ref 70–99)
Glucose-Capillary: 168 mg/dL — ABNORMAL HIGH (ref 70–99)
Glucose-Capillary: 168 mg/dL — ABNORMAL HIGH (ref 70–99)
Glucose-Capillary: 138 mg/dL — ABNORMAL HIGH (ref 70–99)

## 2020-10-05 MED ORDER — SODIUM CHLORIDE 0.9 % IV SOLN: 100.0000 mL | INTRAVENOUS | Status: DC | PRN

## 2020-10-05 MED ORDER — HEPARIN SODIUM (PORCINE) 1000 UNIT/ML DIALYSIS: 1000.0000 [IU] | INTRAMUSCULAR | Status: DC | PRN

## 2020-10-05 MED ORDER — POTASSIUM CHLORIDE CRYS ER 20 MEQ PO TBCR
40.0000 meq | EXTENDED_RELEASE_TABLET | Freq: Once | ORAL | Status: AC
  Administered 2020-10-05: 40 meq via ORAL
  Filled 2020-10-05: qty 2

## 2020-10-05 MED ORDER — PENTAFLUOROPROP-TETRAFLUOROETH EX AERO: 1.0000 "application " | INHALATION_SPRAY | CUTANEOUS | Status: DC | PRN

## 2020-10-05 MED ORDER — HEPARIN SODIUM (PORCINE) 1000 UNIT/ML DIALYSIS: 20.0000 [IU]/kg | INTRAMUSCULAR | Status: DC | PRN

## 2020-10-05 MED ORDER — ALTEPLASE 2 MG IJ SOLR: 2.0000 mg | Freq: Once | INTRAMUSCULAR | Status: DC | PRN

## 2020-10-05 MED ORDER — LIDOCAINE-PRILOCAINE 2.5-2.5 % EX CREA: 1.0000 "application " | TOPICAL_CREAM | CUTANEOUS | Status: DC | PRN

## 2020-10-05 MED ORDER — LIDOCAINE HCL (PF) 1 % IJ SOLN
5.0000 mL | INTRAMUSCULAR | Status: DC | PRN
Start: 2020-10-05 — End: 2020-10-05

## 2020-10-05 NOTE — Progress Notes (Signed)
Report given to Dialysis.

## 2020-10-05 NOTE — Progress Notes (Addendum)
Per lab they could not draw patients blood was able to get a flash but no draw back, lab and patient both wanted dialysis to get labs when patient goes down today. Nurse agreed will notify oncoming nurse.

## 2020-10-05 NOTE — Progress Notes (Addendum)
Night RN notified by day RN that patient is feeling very anxious.  RN went into assess patient.  Patient is axO 4 and states that he feels, "edgey."  Patient states that he feels "afraid".  RN spoke with patient and offered support and redirection.  Patient still asking for medication before mood escalates.  RN will administer medication, Geodon PO and reevaluate.  20:20  Pt called RN into room to tell her that pill is not working; he feels, "like he's wide open".  RN tried to redirect but pt still requesting iv medication; IV medication administered.  Pt also c/o new sudden onset headache, located bilaterally around temples.  Tylenol administered for headache.

## 2020-10-05 NOTE — Progress Notes (Signed)
Pt transported back from dialysis. Pt resting comfortably, bed in lowest position call bell in reach. VS WNL.    RN called pharmacy about giving Pt's Depakene and Geodon. Pr Pharmacy Depakene can be given now and then following dose at 1600 as scheduled and give Geodon now and push evening dose to 2000.

## 2020-10-05 NOTE — Progress Notes (Signed)
[place] [facility] ROUNDING [facility]   Subjective:   Seen and examined on dialysis.  Procedure supervised.  Blood pressure 105/65 and HR 85.   Tolerating goal.  He's not sure of plans for dispo  Review of systems   Denies chest pain  Denies n/v Denies shortness of breath  -------------------- Brief history: This is a 50 year old gentleman with a history of diabetes and end-stage renal disease Monday Wednesday Friday dialysis [place].  He also has a history of congestive heart failure with diastolic dysfunction.  He was brought to the emergency room with a change in behavior.  Violent activity towards his family members.   Objective:  Vital signs in last 24 hours:  Temp:  [97.9 F (36.6 C)-99.3 F (37.4 C)] 97.9 F (36.6 C) (02/25 0728) Pulse Rate:  [71-91] 85 (02/25 0900) Resp:  [13-20] 16 (02/25 0735) BP: (99-132)/(60-74) 105/65 (02/25 0900) SpO2:  [97 %-100 %] 98 % (02/25 0728) Weight:  [108.3 kg] 108.3 kg (02/25 0723)  Weight change:  Filed Weights   10/01/20 1133 10/03/20 0800 10/05/20 0723  Weight: 108.8 kg 109.5 kg 108.3 kg    Intake/Output: I/O last 3 completed shifts: In: [id number] [P.O.:1170] Out: 200 [Urine:200]   Intake/Output this shift:  No intake/output data recorded.  General adult male in bed in no acute distress; visually impaired  HEENT normocephalic atraumatic extraocular movements intact sclera anicteric Neck supple trachea midline Lungs clear to auscultation bilaterally normal work of breathing at rest  Heart S1S2 no rub Abdomen soft nontender nondistended Extremities no edema  Psych normal mood and affect Access - AVG left in use  Basic Metabolic Panel: Recent Labs  Lab 09/30/20 0407 10/01/20 0354 10/02/20 0258 10/03/20 0738 10/05/20 0739  NA 133* 133* 132* 132* 131*  K 3.1* 3.1* 3.0* 3.2* 2.8*  CL 95* 97* 94* 94* 92*  CO2 18* 20* 23 18* 23  GLUCOSE 93 133* 131* 167* 170*  BUN 68* 85* 61* 90* 88*  CREATININE 8.87* 9.42* 7.97* 9.06*  9.01*  CALCIUM 9.3 9.2 9.0 9.0 8.9  MG 2.4 2.4  --   --   --   PHOS 4.1 4.3 4.1 3.7 4.1    Liver Function Tests: Recent Labs  Lab 09/30/20 0407 10/01/20 0354 10/02/20 0258 10/03/20 0738 10/05/20 0739  ALBUMIN 3.3* 3.2* 3.2* 3.1* 2.8*    CBC: Recent Labs  Lab 10/01/20 0354 10/03/20 0738 10/05/20 0739  WBC 6.0 6.5 7.8  HGB 12.0* 12.5* 10.5*  HCT 35.3* 35.7* 30.6*  MCV 100.3* 99.7 101.3*  PLT 128* 116* 120*     Imaging: No results found.   Medications:   . sodium chloride    . sodium chloride     . amLODipine  5 mg Oral Daily  . atorvastatin  10 mg Oral QHS  . busPIRone  5 mg Oral BID  . Chlorhexidine Gluconate Cloth  6 each Topical Q0600  . dorzolamide-timolol  1 drop Both Eyes BID  . folic acid  1 mg Oral Daily  . heparin injection (subcutaneous)  5,000 Units Subcutaneous Q8H  . insulin aspart  0-15 Units Subcutaneous Q4H  . metoCLOPramide  5 mg Oral TID AC  . pantoprazole  40 mg Oral BID AC  . polyethylene glycol  17 g Oral Daily  . senna-docusate  1 tablet Oral BID  . sucralfate  1 g Oral QID  . valproic acid  750 mg Oral TID  . ziprasidone  40 mg Oral QPM   And  . ziprasidone  20 mg Oral Q breakfast   sodium chloride, sodium chloride, acetaminophen **OR** acetaminophen, alteplase, heparin, heparin, hydrALAZINE, labetalol, lidocaine (PF), LORazepam, pentafluoroprop-tetrafluoroeth, polyethylene glycol, sorbitol  Assessment/ Plan:  1.Clotted/thrombosed left upper extremity AV graft: Status post declot of AV graft by IR on 2/9, access working well.  2. ESRD:MWF at [facility]:  - Continue dialysis Monday Wednesday Friday schedule.  3Hypertension: note have reduced amlodipine from 10 to 5 mg daily  4.Anemia of ESRD:Hemoglobin at goal. Aranesp on hold.   5. Metabolic Bone Disease: Calcium and phosphorus level acceptable.  6. Hypokalemia: 3K bath.  Potassium 40 meq once now; BMP, mag in AM.   7. Acute metabolic encephalopathy, suicidal and  homicidal attempt with history of bipolar mood disorder: Seen by neurology.  Per primary team.  Disposition per primary team   Claudia Desanctis, MD 10/05/2020 9:21 AM
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
## 2020-10-06 DIAGNOSIS — Z789 Other specified health status: Secondary | ICD-10-CM | POA: Diagnosis not present

## 2020-10-06 LAB — BASIC METABOLIC PANEL
Anion gap: 11 (ref 5–15)
BUN: 53 mg/dL — ABNORMAL HIGH (ref 6–20)
CO2: 25 mmol/L (ref 22–32)
Calcium: 8.9 mg/dL (ref 8.9–10.3)
Chloride: 97 mmol/L — ABNORMAL LOW (ref 98–111)
Creatinine, Ser: 6.75 mg/dL — ABNORMAL HIGH (ref 0.61–1.24)
GFR, Estimated: 9 mL/min — ABNORMAL LOW (ref >60)
Glucose, Bld: 127 mg/dL — ABNORMAL HIGH (ref 70–99)
Potassium: 3.2 mmol/L — ABNORMAL LOW (ref 3.5–5.1)
Sodium: 133 mmol/L — ABNORMAL LOW (ref 135–145)

## 2020-10-06 LAB — GLUCOSE, CAPILLARY
Glucose-Capillary: 110 mg/dL — ABNORMAL HIGH (ref 70–99)
Glucose-Capillary: 134 mg/dL — ABNORMAL HIGH (ref 70–99)
Glucose-Capillary: 142 mg/dL — ABNORMAL HIGH (ref 70–99)
Glucose-Capillary: 145 mg/dL — ABNORMAL HIGH (ref 70–99)
Glucose-Capillary: 156 mg/dL — ABNORMAL HIGH (ref 70–99)
Glucose-Capillary: 172 mg/dL — ABNORMAL HIGH (ref 70–99)

## 2020-10-06 LAB — MAGNESIUM: Magnesium: 2.3 mg/dL (ref 1.7–2.4)

## 2020-10-06 MED ORDER — POTASSIUM CHLORIDE CRYS ER 20 MEQ PO TBCR
20.0000 meq | EXTENDED_RELEASE_TABLET | Freq: Once | ORAL | Status: AC
  Administered 2020-10-06: 20 meq via ORAL
  Filled 2020-10-06: qty 1
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
## 2020-10-07 DIAGNOSIS — Z789 Other specified health status: Secondary | ICD-10-CM | POA: Diagnosis not present

## 2020-10-07 LAB — BASIC METABOLIC PANEL
Anion gap: 18 — ABNORMAL HIGH (ref 5–15)
BUN: 83 mg/dL — ABNORMAL HIGH (ref 6–20)
CO2: 21 mmol/L — ABNORMAL LOW (ref 22–32)
Calcium: 9 mg/dL (ref 8.9–10.3)
Chloride: 95 mmol/L — ABNORMAL LOW (ref 98–111)
Creatinine, Ser: 7.79 mg/dL — ABNORMAL HIGH (ref 0.61–1.24)
GFR, Estimated: 8 mL/min — ABNORMAL LOW (ref >60)
Glucose, Bld: 136 mg/dL — ABNORMAL HIGH (ref 70–99)
Potassium: 3.4 mmol/L — ABNORMAL LOW (ref 3.5–5.1)
Sodium: 134 mmol/L — ABNORMAL LOW (ref 135–145)

## 2020-10-07 LAB — GLUCOSE, CAPILLARY
Glucose-Capillary: 132 mg/dL — ABNORMAL HIGH (ref 70–99)
Glucose-Capillary: 126 mg/dL — ABNORMAL HIGH (ref 70–99)
Glucose-Capillary: 137 mg/dL — ABNORMAL HIGH (ref 70–99)
Glucose-Capillary: 145 mg/dL — ABNORMAL HIGH (ref 70–99)
Glucose-Capillary: 140 mg/dL — ABNORMAL HIGH (ref 70–99)

## 2020-10-07 MED ORDER — LORAZEPAM 1 MG PO TABS
1.0000 mg | ORAL_TABLET | Freq: Four times a day (QID) | ORAL | Status: DC | PRN
  Administered 2020-10-07 – 2020-10-12 (×8): 1 mg via ORAL
  Filled 2020-10-07 (×8): qty 1

## 2020-10-07 MED ORDER — CHLORHEXIDINE GLUCONATE CLOTH 2 % EX PADS: 6.0000 | MEDICATED_PAD | Freq: Every day | CUTANEOUS | Status: DC

## 2020-10-07 NOTE — Progress Notes (Signed)
Patient asking for anxiety medication.  Pt redirected and emotional support given; pt asked to call sister Inez Catalina for emotional support; Inez Catalina was called and they talked for some length over the phone.  Patient still requesting anxiety medication at this time; Pt stated, "I feel anxious."  Pt cannot verbalize what is causing him to feel anxious at this time.  PRN ativan administered.
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
## 2020-10-08 DIAGNOSIS — Z789 Other specified health status: Secondary | ICD-10-CM | POA: Diagnosis not present

## 2020-10-08 DIAGNOSIS — I129 Hypertensive chronic kidney disease with stage 1 through stage 4 chronic kidney disease, or unspecified chronic kidney disease: Secondary | ICD-10-CM | POA: Diagnosis not present

## 2020-10-08 DIAGNOSIS — E782 Mixed hyperlipidemia: Secondary | ICD-10-CM | POA: Diagnosis not present

## 2020-10-08 DIAGNOSIS — N183 Chronic kidney disease, stage 3 unspecified: Secondary | ICD-10-CM | POA: Diagnosis not present

## 2020-10-08 DIAGNOSIS — E1122 Type 2 diabetes mellitus with diabetic chronic kidney disease: Secondary | ICD-10-CM | POA: Diagnosis not present

## 2020-10-08 DIAGNOSIS — I10 Essential (primary) hypertension: Secondary | ICD-10-CM | POA: Diagnosis not present

## 2020-10-08 LAB — RENAL FUNCTION PANEL
Albumin: 2.8 g/dL — ABNORMAL LOW (ref 3.5–5.0)
Anion gap: 15 (ref 5–15)
BUN: 103 mg/dL — ABNORMAL HIGH (ref 6–20)
CO2: 20 mmol/L — ABNORMAL LOW (ref 22–32)
Calcium: 9 mg/dL (ref 8.9–10.3)
Chloride: 96 mmol/L — ABNORMAL LOW (ref 98–111)
Creatinine, Ser: 7.48 mg/dL — ABNORMAL HIGH (ref 0.61–1.24)
GFR, Estimated: 8 mL/min — ABNORMAL LOW (ref >60)
Glucose, Bld: 144 mg/dL — ABNORMAL HIGH (ref 70–99)
Phosphorus: 4 mg/dL (ref 2.5–4.6)
Potassium: 3.1 mmol/L — ABNORMAL LOW (ref 3.5–5.1)
Sodium: 131 mmol/L — ABNORMAL LOW (ref 135–145)

## 2020-10-08 LAB — CBC
HCT: 31.3 % — ABNORMAL LOW (ref 39.0–52.0)
Hemoglobin: 10.9 g/dL — ABNORMAL LOW (ref 13.0–17.0)
MCH: 34.6 pg — ABNORMAL HIGH (ref 26.0–34.0)
MCHC: 34.8 g/dL (ref 30.0–36.0)
MCV: 99.4 fL (ref 80.0–100.0)
Platelets: 149 10*3/uL — ABNORMAL LOW (ref 150–400)
RBC: 3.15 MIL/uL — ABNORMAL LOW (ref 4.22–5.81)
RDW: 12.5 % (ref 11.5–15.5)
WBC: 5.6 10*3/uL (ref 4.0–10.5)
nRBC: 0 % (ref 0.0–0.2)

## 2020-10-08 LAB — GLUCOSE, CAPILLARY
Glucose-Capillary: 107 mg/dL — ABNORMAL HIGH (ref 70–99)
Glucose-Capillary: 109 mg/dL — ABNORMAL HIGH (ref 70–99)
Glucose-Capillary: 128 mg/dL — ABNORMAL HIGH (ref 70–99)
Glucose-Capillary: 140 mg/dL — ABNORMAL HIGH (ref 70–99)
Glucose-Capillary: 211 mg/dL — ABNORMAL HIGH (ref 70–99)
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
## 2020-10-08 NOTE — Progress Notes (Signed)
Physical Therapy Treatment Patient Details Name: Timothy Clark MRN: [id number] DOB: Feb 01, 1971 Today's Date: 10/08/2020    History of Present Illness The pt is a 50 yo male presenting with confusion, demonstrating self-harm and acting violently towards his family. Pt found to have acute metabolic encephalopathy. Pt had fall on morning of 10/02/20 - CT head was negative.  He later c/o L knee pain - imaging pending.  Pt with difficult discharge plan/placement and may have to go home with family.   PMH includes: ESRD on HD MWF, seizures, HTN, DM II, CHF, depression, bipolar disorder.    PT Comments    Pt progressing slowly toward goals, generally due to recent day being in a stupor/?encephalopathic.  Today, pt was more alert, needing less repetition to maintain some focus on task.  Emphasis on warm up exercise, transition to EOB, sitting balance, sit to stand, short distance gait, standing sink activity.    Follow Up Recommendations  SNF     Equipment Recommendations  Rolling walker with 5" wheels;Wheelchair (measurements PT);Wheelchair cushion (measurements PT);3in1 (PT)    Recommendations for Other Services       Precautions / Restrictions Precautions Precaution Comments: pt is blind    Mobility  Bed Mobility Overal bed mobility: Needs Assistance Bed Mobility: Supine to Sit     Supine to sit: Mod assist;+2 for safety/equipment     General bed mobility comments: After cues to initiate, pt moved toward EOB and needed assist up toward R elbow    Transfers Overall transfer level: Needs assistance Equipment used: Rolling walker (2 wheeled) Transfers: Sit to/from Stand Sit to Stand: Mod assist            Ambulation/Gait Ambulation/Gait assistance: Min assist;+2 safety/equipment Gait Distance (Feet): 12 Feet (x2) Assistive device: Rolling walker (2 wheeled) Gait Pattern/deviations: Step-through pattern;Decreased stride length Gait velocity: decreased Gait velocity  interpretation: <1.31 ft/sec, indicative of household ambulator General Gait Details: slow movements in general, physical cueing for posture and staying up closer to the RW. moderately heavy lean on the RW.  Consistent cuing for direction.   Stairs             Wheelchair Mobility    Modified Rankin (Stroke Patients Only)       Balance   Sitting-balance support: No upper extremity supported;Feet supported Sitting balance-Leahy Scale: Fair Sitting balance - Comments: slight forward list, min guard for safety.   Standing balance support: Single extremity supported;Bilateral upper extremity supported;During functional activity;No upper extremity supported Standing balance-Leahy Scale: Poor Standing balance comment: reliant on AD/stationary surface or exernal support in generall, but able to release his hands briefly to hold a washcloth under the water and then slowly wash his face.  pt leaned into the sink base during this time.                            Cognition   Behavior During Therapy: Flat affect Overall Cognitive Status: Impaired/Different from baseline Area of Impairment: Attention;Following commands;Awareness;Problem solving                 Orientation Level: Disoriented to;Time Current Attention Level: Focused;Sustained   Following Commands: Follows one step commands with increased time Safety/Judgement: Decreased awareness of safety;Decreased awareness of deficits Awareness: Intellectual Problem Solving: Slow processing;Decreased initiation;Difficulty sequencing;Requires verbal cues;Requires tactile cues General Comments: pt still stuporous, but minimally improved, needing less repetition with cuing.      Exercises Other Exercises Other Exercises:  hip/knee flexion/ext AROM with resisted gross extension for warm up prior to mobility.    General Comments General comments (skin integrity, edema, etc.): vss throughout.  Slowed responses to  question and conversation, but did respond more readily than in past sessions.      Pertinent Vitals/Pain Pain Assessment: Faces Faces Pain Scale: No hurt Pain Intervention(s): Monitored during session    Home Living                      Prior Function            PT Goals (current goals can now be found in the care plan section) Acute Rehab PT Goals Patient Stated Goal: sit in chair PT Goal Formulation: With patient Time For Goal Achievement: 10/19/20 Potential to Achieve Goals: Fair Progress towards PT goals: Progressing toward goals (continue same goals)    Frequency    Min 3X/week      PT Plan Current plan remains appropriate;Frequency needs to be updated    Co-evaluation              AM-PAC PT "6 Clicks" Mobility   Outcome Measure  Help needed turning from your back to your side while in a flat bed without using bedrails?: A Lot Help needed moving from lying on your back to sitting on the side of a flat bed without using bedrails?: A Lot Help needed moving to and from a bed to a chair (including a wheelchair)?: A Lot Help needed standing up from a chair using your arms (e.g., wheelchair or bedside chair)?: A Lot Help needed to walk in hospital room?: A Little Help needed climbing 3-5 steps with a railing? : A Lot 6 Click Score: 13    End of Session   Activity Tolerance: Patient limited by fatigue;Patient limited by lethargy;Patient tolerated treatment well Patient left: in chair;with chair alarm set;with call bell/phone within reach Nurse Communication: Mobility status PT Visit Diagnosis: Unsteadiness on feet (R26.81);Other abnormalities of gait and mobility (R26.89);Difficulty in walking, not elsewhere classified (R26.2)     Time: [id number] PT Time Calculation (min) (ACUTE ONLY): 25 min  Charges:  $Gait Training: 8-22 mins                     10/08/2020  Ginger Carne., PT Acute Rehabilitation Services [phone]  (pager) [phone]   (office)   Tessie Fass Aneya Daddona 10/08/2020, 3:48 PM

## 2020-10-08 NOTE — Procedures (Signed)
I was present at this dialysis session. I have reviewed the session itself and made appropriate changes.   Vital signs in last 24 hours:  Temp:  [97.6 F (36.4 C)-98.1 F (36.7 C)] 97.8 F (36.6 C) (02/28 0735) Pulse Rate:  [65-78] 78 (02/28 0830) Resp:  [16-18] 18 (02/28 0750) BP: (98-137)/(58-73) 99/59 (02/28 0830) SpO2:  [92 %-100 %] 98 % (02/28 0735) Weight:  [110.5 kg] 110.5 kg (02/28 0735) Weight change:  Filed Weights   10/05/20 0723 10/05/20 1040 10/08/20 0735  Weight: 108.3 kg 106.6 kg 110.5 kg    Recent Labs  Lab 10/05/20 0739 10/06/20 0047 10/07/20 0418  NA 131*   < > 134*  K 2.8*   < > 3.4*  CL 92*   < > 95*  CO2 23   < > 21*  GLUCOSE 170*   < > 136*  BUN 88*   < > 83*  CREATININE 9.01*   < > 7.79*  CALCIUM 8.9   < > 9.0  PHOS 4.1  --   --    < > = values in this interval not displayed.    Recent Labs  Lab 10/03/20 0738 10/05/20 0739 10/08/20 0734  WBC 6.5 7.8 5.6  HGB 12.5* 10.5* 10.9*  HCT 35.7* 30.6* 31.3*  MCV 99.7 101.3* 99.4  PLT 116* 120* 149*    Scheduled Meds: . amLODipine  5 mg Oral Daily  . atorvastatin  10 mg Oral QHS  . Chlorhexidine Gluconate Cloth  6 each Topical Q0600  . dorzolamide-timolol  1 drop Both Eyes BID  . folic acid  1 mg Oral Daily  . heparin injection (subcutaneous)  5,000 Units Subcutaneous Q8H  . insulin aspart  0-15 Units Subcutaneous Q4H  . metoCLOPramide  5 mg Oral TID AC  . pantoprazole  40 mg Oral BID AC  . polyethylene glycol  17 g Oral Daily  . senna-docusate  1 tablet Oral BID  . sucralfate  1 g Oral QID  . valproic acid  750 mg Oral TID  . ziprasidone  40 mg Oral QPM   And  . ziprasidone  20 mg Oral Q breakfast   Continuous Infusions: PRN Meds:.acetaminophen **OR** acetaminophen, hydrALAZINE, labetalol, LORazepam, polyethylene glycol, sorbitol    [facility] EDW127kg[facility] Bath 3K/2.5CaTime 4:15Heparin1000unit bolus then 600 units/hr. AccessLUE AVGBFR 400DFR  600  Hectoral 33mg IV/HD Epogen1000Units IV/HD Venofer 50 mg IV weekly  Assessment/Plan 1. ESRD- cont with HD MWF schedule 2. Acute metabolic encephalopathy with suicidal and homicidal attempt.  History of bipolar mood disorder.  Neuro and Psych consulted and meds adjusted.  Ativan prn  3. Vascular access- s/p declot of AVG 4. Anemia of ESRD- Hgb at goal 5. HTN/volume - stable 6. CKD-MBD- continue with home meds. Labs stable. 7. Disposition- per primary svc.  CIR consulted last week and felt not to be a candidate.  Likely will require SNF placement.   JDonetta Potts  MD 10/08/2020, 8:36 AM

## 2020-10-08 NOTE — Progress Notes (Signed)
OT Cancellation Note  Patient Details Name: Timothy Clark MRN: [id number] DOB: 1971-04-14   Cancelled Treatment:    Reason Eval/Treat Not Completed: Patient at procedure or test/ unavailable (HD. Will return as schedule allows.)  [place], OTR/L Acute Rehab Pager: [phone] Office: [phone] 10/08/2020, 7:50 AM

## 2020-10-08 NOTE — Progress Notes (Signed)
Occupational Therapy Treatment Patient Details Name: Timothy Clark MRN: [id number] DOB: [birth year] Today's Date: 10/08/2020    History of present illness The pt is a 50 yo male presenting with confusion, demonstrating self-harm and acting violently towards his family. Pt found to have acute metabolic encephalopathy. Pt had fall on morning of 10/02/20 - CT head was negative.  He later c/o L knee pain - imaging pending.  Pt with difficult discharge plan/placement and may have to go home with family.   PMH includes: ESRD on HD MWF, seizures, HTN, DM II, CHF, depression, bipolar disorder.   OT comments  Pt progressing slowly towards established OT goals. Pt presenting with increased activity tolerance this session and performing grooming standing at sink in bathroom. Pt requiring Mod A +2 for functional mobility and Max cues for RW management. Pt washing his face at sink with Min A for balance and cues for sequencing. Continue to recommend dc to SNF as pt continues to present with poor cognition, balance, strength, and safety, Will continue to follow acutely as admitted.    Follow Up Recommendations  SNF    Equipment Recommendations  Other (comment) (Defer to next venue)    Recommendations for Other Services PT consult    Precautions / Restrictions Precautions Precautions: Fall Precaution Comments: Legally blind       Mobility Bed Mobility Overal bed mobility: Needs Assistance Bed Mobility: Supine to Sit     Supine to sit: Mod assist;+2 for safety/equipment     General bed mobility comments: After cues to initiate, pt moved toward EOB and needed assist up toward R elbow    Transfers Overall transfer level: Needs assistance Equipment used: Rolling walker (2 wheeled) Transfers: Sit to/from Stand Sit to Stand: Mod assist         General transfer comment: Mod A for power up    Balance   Sitting-balance support: No upper extremity supported;Feet supported Sitting  balance-Leahy Scale: Fair Sitting balance - Comments: slight forward list, min guard for safety.   Standing balance support: Single extremity supported;Bilateral upper extremity supported;During functional activity;No upper extremity supported Standing balance-Leahy Scale: Poor Standing balance comment: reliant on AD/stationary surface or exernal support in generall, but able to release his hands briefly to hold a washcloth under the water and then slowly wash his face.  pt leaned into the sink base during this time.                           ADL either performed or assessed with clinical judgement   ADL Overall ADL's : Needs assistance/impaired     Grooming: Minimal assistance;Standing;Wash/dry face Grooming Details (indicate cue type and reason): Min A for standing balance. Pt fatigues quickly and leaning heavily on sink with BUEs. Pt requiring assistance to turn on faucet due to visual deficits. Pt requiring Min cues for sequencing tasks.                 Toilet Transfer: +2 for physical assistance;+2 for safety/equipment;Moderate assistance;Ambulation (simulated to recliner) Toilet Transfer Details (indicate cue type and reason): Mod A to power up into standing and then Max cues for RW management and balance         Functional mobility during ADLs: Rolling walker;+2 for safety/equipment;+2 for physical assistance;Moderate assistance General ADL Comments: Pt continues to present with decreased cognition, balance, strength, and safety. Pt with increased activity [place] this session and performing grooming at sink  Vision       Perception     Praxis      Cognition Arousal/Alertness: Awake/alert Behavior During Therapy: Flat affect Overall Cognitive Status: Impaired/Different from baseline Area of Impairment: Attention;Following commands;Awareness;Problem solving;Safety/judgement                 Orientation Level: Disoriented to;Time Current  Attention Level: Focused;Sustained   Following Commands: Follows one step commands with increased time Safety/Judgement: Decreased awareness of safety;Decreased awareness of deficits Awareness: Intellectual Problem Solving: Slow processing;Decreased initiation;Difficulty sequencing;Requires verbal cues;Requires tactile cues General Comments: Pt continues to present with poor arousal        Exercises Other Exercises Other Exercises: hip/knee flexion/ext AROM with resisted gross extension for warm up prior to mobility.   Shoulder Instructions       General Comments VSS on RA    Pertinent Vitals/ Pain       Pain Assessment: Faces Faces Pain Scale: No hurt Pain Intervention(s): Monitored during session  Home Living                                          Prior Functioning/Environment              Frequency  Min 2X/week        Progress Toward Goals  OT Goals(current goals can now be found in the care plan section)  Progress towards OT goals: Progressing toward goals  Acute Rehab OT Goals Patient Stated Goal: sit in chair OT Goal Formulation: With patient Time For Goal Achievement: 10/18/20 Potential to Achieve Goals: Good ADL Goals Pt Will Perform Grooming: with min guard assist;sitting Pt Will Perform Upper Body Dressing: with min assist;sitting Pt Will Perform Lower Body Dressing: with min assist;sit to/from stand Pt Will Transfer to Toilet: with min assist;ambulating;bedside commode;with +2 assist Pt Will Perform Toileting - Clothing Manipulation and hygiene: with min assist;sitting/lateral leans;sit to/from stand Additional ADL Goal #1: Pt will demonstrate selective attention during ADLs with Min cues  Plan Discharge plan remains appropriate    Co-evaluation    PT/OT/SLP Co-Evaluation/Treatment: Yes Reason for Co-Treatment: For patient/therapist safety;To address functional/ADL transfers   OT goals addressed during session: ADL's and  self-care      AM-PAC OT "6 Clicks" Daily Activity     Outcome Measure   Help from another person eating meals?: A Lot Help from another person taking care of personal grooming?: A Lot Help from another person toileting, which includes using toliet, bedpan, or urinal?: A Lot Help from another person bathing (including washing, rinsing, drying)?: A Lot Help from another person to put on and taking off regular upper body clothing?: A Lot Help from another person to put on and taking off regular lower body clothing?: A Lot 6 Click Score: 12    End of Session Equipment Utilized During Treatment: Rolling walker  OT Visit Diagnosis: Unsteadiness on feet (R26.81);Other abnormalities of gait and mobility (R26.89);Muscle weakness (generalized) (M62.81)   Activity Tolerance Patient tolerated treatment well   Patient Left in chair;with call bell/phone within reach;with chair alarm set   Nurse Communication Mobility status        Time: 1411-1436 OT Time Calculation (min): 25 min  Charges: OT General Charges $OT Visit: 1 Visit OT Treatments $Self Care/Home Management : 8-22 mins  [place], OTR/L Acute Rehab Pager: [phone] Office: [place] 10/08/2020, 4:50 PM

## 2020-10-09 DIAGNOSIS — Z789 Other specified health status: Secondary | ICD-10-CM | POA: Diagnosis not present

## 2020-10-09 LAB — GLUCOSE, CAPILLARY
Glucose-Capillary: 189 mg/dL — ABNORMAL HIGH (ref 70–99)
Glucose-Capillary: 157 mg/dL — ABNORMAL HIGH (ref 70–99)
Glucose-Capillary: 124 mg/dL — ABNORMAL HIGH (ref 70–99)
Glucose-Capillary: 158 mg/dL — ABNORMAL HIGH (ref 70–99)
Glucose-Capillary: 128 mg/dL — ABNORMAL HIGH (ref 70–99)

## 2020-10-09 MED ORDER — BRIMONIDINE TARTRATE 0.15 % OP SOLN
1.0000 [drp] | Freq: Three times a day (TID) | OPHTHALMIC | Status: DC
  Administered 2020-10-09 – 2020-10-13 (×13): 1 [drp] via OPHTHALMIC
  Filled 2020-10-09: qty 5

## 2020-10-09 NOTE — Plan of Care (Signed)
  Problem: Clinical Measurements: Goal: Diagnostic test results will improve Outcome: Progressing Goal: Cardiovascular complication will be avoided Outcome: Progressing   Problem: Activity: Goal: Risk for activity intolerance will decrease Outcome: Progressing   

## 2020-10-09 NOTE — Progress Notes (Addendum)
Received report from [place] [phone],  PT in bed, no complaints of pain or discomfort.  Vs taken by the CNA.  Pt alert and oriented x4, no complaints of pain or discomfort.  Bed in low position, call bell within reach.  Bed alarms on and functioning.  Assessment done and charted.  Will continue to monitor and do hourly rounding throughout the shift

## 2020-10-09 NOTE — Progress Notes (Signed)
Physical Therapy Treatment Patient Details Name: Timothy Clark MRN: [id number] DOB: 1971-05-01 Today's Date: 10/09/2020    History of Present Illness The pt is a 50 yo male presenting with confusion, demonstrating self-harm and acting violently towards his family. Pt found to have acute metabolic encephalopathy. Pt had fall on morning of 10/02/20 - CT head was negative.  He later c/o L knee pain - imaging pending.  Pt with difficult discharge plan/placement and may have to go home with family.   PMH includes: ESRD on HD MWF, seizures, HTN, DM II, CHF, depression, bipolar disorder.    PT Comments    Pt with increased lethargy and participation this session keeping eyes closed t/o session with minimal verbal interaction. Pt requiring increased assist today to complete transfers. Pt remains unsafe to d/c home as pt at significant falls risk and is requiring mod/maxA for mobility in which family can not provide. Acute PT to cont to follow.   Follow Up Recommendations  SNF - aware "no SNF will take pt", pt will need to be minimally minA with mobility for safe d/c home with father     Equipment Recommendations  Rolling walker with 5" wheels;Wheelchair (measurements PT);Wheelchair cushion (measurements PT);3in1 (PT)    Recommendations for Other Services Rehab consult     Precautions / Restrictions Precautions Precautions: Fall Precaution Comments: legally blind Restrictions Weight Bearing Restrictions: No    Mobility  Bed Mobility Overal bed mobility: Needs Assistance Bed Mobility: Supine to Sit     Supine to sit: Mod assist;+2 for safety/equipment     General bed mobility comments: max directional and verbal cues to complete task, modAx2 for trunk elevation, modA to maintain EOB balance    Transfers Overall transfer level: Needs assistance Equipment used: Rolling walker (2 wheeled) Transfers: Sit to/from Stand Sit to Stand: Mod assist;+2 physical assistance Stand pivot  transfers: Mod assist;Max assist;+2 physical assistance       General transfer comment: attempted to stand up to RW, pt with eyes closed required maxA to to power up to RW, pt with L lateral posterior lean and no effort to maintain standing requiring to sit on bed. pt then completed std pvt transfer with 2 person assist face to face with modAx2, pt did better with increased support  Ambulation/Gait             General Gait Details: unable to amb today due to lethargy   Stairs             Wheelchair Mobility    Modified Rankin (Stroke Patients Only)       Balance Overall balance assessment: Needs assistance Sitting-balance support: No upper extremity supported;Feet supported Sitting balance-Leahy Scale: Fair Sitting balance - Comments: pt with L lateral posterior lean, pt with increased lethargy and decreased effort/participation today   Standing balance support: Bilateral upper extremity supported;During functional activity Standing balance-Leahy Scale: Poor Standing balance comment: reliant on AD/stationary surface or exernal support in generall, but able to release his hands briefly to hold a washcloth under the water and then slowly wash his face.  pt leaned into the sink base during this time.                            Cognition Arousal/Alertness: Lethargic Behavior During Therapy: Flat affect Overall Cognitive Status: No family/caregiver present to determine baseline cognitive functioning  General Comments: pt kept eyes closed t/o session, difficulty to keep awake, pt with depressed spirits saying "I"m crazy, I'm having a rough day" but when asked why he couldn't state. Pt requiring constant verbal and tactile cues to complete tasks and OOB mobility,      Exercises      General Comments General comments (skin integrity, edema, etc.): VSS      Pertinent Vitals/Pain Pain Assessment: Faces Faces  Pain Scale: No hurt    Home Living                      Prior Function            PT Goals (current goals can now be found in the care plan section) Progress towards PT goals: Not progressing toward goals - comment (very lethargic today)    Frequency    Min 3X/week      PT Plan Current plan remains appropriate;Frequency needs to be updated    Co-evaluation              AM-PAC PT "6 Clicks" Mobility   Outcome Measure  Help needed turning from your back to your side while in a flat bed without using bedrails?: A Lot Help needed moving from lying on your back to sitting on the side of a flat bed without using bedrails?: A Lot Help needed moving to and from a bed to a chair (including a wheelchair)?: A Lot Help needed standing up from a chair using your arms (e.g., wheelchair or bedside chair)?: A Lot Help needed to walk in hospital room?: A Lot Help needed climbing 3-5 steps with a railing? : Total 6 Click Score: 11    End of Session Equipment Utilized During Treatment: Gait belt Activity Tolerance: Patient limited by lethargy Patient left: in chair;with call bell/phone within reach;with chair alarm set Nurse Communication: Mobility status PT Visit Diagnosis: Unsteadiness on feet (R26.81);Other abnormalities of gait and mobility (R26.89);Difficulty in walking, not elsewhere classified (R26.2)     Time: [id number] PT Time Calculation (min) (ACUTE ONLY): 20 min  Charges:  $Therapeutic Activity: 8-22 mins                     Kittie Plater, PT, DPT Acute Rehabilitation Services Pager #: [phone] Office #: [phone]    [place] Lopes 10/09/2020, 1:37 PM
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
## 2020-10-10 DIAGNOSIS — Z789 Other specified health status: Secondary | ICD-10-CM | POA: Diagnosis not present

## 2020-10-10 DIAGNOSIS — G9340 Encephalopathy, unspecified: Secondary | ICD-10-CM | POA: Diagnosis not present

## 2020-10-10 DIAGNOSIS — I10 Essential (primary) hypertension: Secondary | ICD-10-CM

## 2020-10-10 DIAGNOSIS — N186 End stage renal disease: Secondary | ICD-10-CM | POA: Diagnosis not present

## 2020-10-10 DIAGNOSIS — F310 Bipolar disorder, current episode hypomanic: Secondary | ICD-10-CM | POA: Diagnosis not present

## 2020-10-10 LAB — CBC
HCT: 28.9 % — ABNORMAL LOW (ref 39.0–52.0)
Hemoglobin: 10.4 g/dL — ABNORMAL LOW (ref 13.0–17.0)
MCH: 35.7 pg — ABNORMAL HIGH (ref 26.0–34.0)
MCHC: 36 g/dL (ref 30.0–36.0)
MCV: 99.3 fL (ref 80.0–100.0)
Platelets: 143 10*3/uL — ABNORMAL LOW (ref 150–400)
RBC: 2.91 MIL/uL — ABNORMAL LOW (ref 4.22–5.81)
RDW: 12.8 % (ref 11.5–15.5)
WBC: 6.1 10*3/uL (ref 4.0–10.5)
nRBC: 0 % (ref 0.0–0.2)

## 2020-10-10 LAB — RENAL FUNCTION PANEL
Albumin: 2.8 g/dL — ABNORMAL LOW (ref 3.5–5.0)
Anion gap: 14 (ref 5–15)
BUN: 94 mg/dL — ABNORMAL HIGH (ref 6–20)
CO2: 21 mmol/L — ABNORMAL LOW (ref 22–32)
Calcium: 9.3 mg/dL (ref 8.9–10.3)
Chloride: 99 mmol/L (ref 98–111)
Creatinine, Ser: 6.17 mg/dL — ABNORMAL HIGH (ref 0.61–1.24)
GFR, Estimated: 10 mL/min — ABNORMAL LOW (ref >60)
Glucose, Bld: 145 mg/dL — ABNORMAL HIGH (ref 70–99)
Phosphorus: 3.2 mg/dL (ref 2.5–4.6)
Potassium: 2.8 mmol/L — ABNORMAL LOW (ref 3.5–5.1)
Sodium: 134 mmol/L — ABNORMAL LOW (ref 135–145)

## 2020-10-10 LAB — GLUCOSE, CAPILLARY
Glucose-Capillary: 106 mg/dL — ABNORMAL HIGH (ref 70–99)
Glucose-Capillary: 118 mg/dL — ABNORMAL HIGH (ref 70–99)
Glucose-Capillary: 133 mg/dL — ABNORMAL HIGH (ref 70–99)
Glucose-Capillary: 163 mg/dL — ABNORMAL HIGH (ref 70–99)
Glucose-Capillary: 174 mg/dL — ABNORMAL HIGH (ref 70–99)
Glucose-Capillary: 179 mg/dL — ABNORMAL HIGH (ref 70–99)

## 2020-10-10 MED ORDER — INSULIN ASPART 100 UNIT/ML ~~LOC~~ SOLN
0.0000 [IU] | Freq: Three times a day (TID) | SUBCUTANEOUS | Status: DC
  Administered 2020-10-10 (×2): 3 [IU] via SUBCUTANEOUS
  Administered 2020-10-11: 2 [IU] via SUBCUTANEOUS
  Administered 2020-10-11: 5 [IU] via SUBCUTANEOUS
  Administered 2020-10-11 – 2020-10-13 (×5): 3 [IU] via SUBCUTANEOUS

## 2020-10-10 NOTE — Progress Notes (Signed)
Patient in dialysis.

## 2020-10-10 NOTE — Plan of Care (Signed)

## 2020-10-10 NOTE — Progress Notes (Signed)
OT Cancellation Note  Patient Details Name: Timothy Clark MRN: [id number] DOB: 08/07/1971   Cancelled Treatment:    Reason Eval/Treat Not Completed: Patient at procedure or test/ unavailable. Pt off unit at HD, will follow up next available time  [name] 10/10/2020, 1:49 PM
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
## 2020-10-10 NOTE — Plan of Care (Signed)
  Problem: Clinical Measurements: Goal: Ability to maintain clinical measurements within normal limits will improve Outcome: Progressing Goal: Will remain free from infection Outcome: Progressing   Problem: Activity: Goal: Risk for activity intolerance will decrease Outcome: Progressing   
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
## 2020-10-10 NOTE — Progress Notes (Signed)
PT Cancellation Note  Patient Details Name: Timothy Clark MRN: [id number] DOB: [birth year]   Cancelled Treatment:    Reason Eval/Treat Not Completed: Patient at procedure or test/unavailable Patient off unit in HD. PT will re-attempt as time allows.   Solei Wubben A. Gilford Rile PT, DPT Acute Rehabilitation Services Pager [phone] Office [phone]    Linna Hoff 10/10/2020, 1:41 PM

## 2020-10-10 NOTE — Progress Notes (Signed)
03/01 1020 Pt difficult to arouse previously so rapid response was called eventually the patient was able to wake up before they arrived without any event."Pt stated I'm ok, I'm ok'' Pt oriented X4. Pt is stable and observed be comfortably sleeping. RN will continue to monitor pt.  03/02 0200 Pt is stable. Pt is oriented X4. Pt observed to be comfortably sleeping.RN will continue to monitor pt.  03/02 0600 Pt is stable. Pt oriented x4. Pt is observed to be comfortaby sleeping. Pt took all his medication without an issue.

## 2020-10-11 DIAGNOSIS — G9340 Encephalopathy, unspecified: Secondary | ICD-10-CM | POA: Diagnosis not present

## 2020-10-11 DIAGNOSIS — N186 End stage renal disease: Secondary | ICD-10-CM | POA: Diagnosis not present

## 2020-10-11 DIAGNOSIS — F310 Bipolar disorder, current episode hypomanic: Secondary | ICD-10-CM | POA: Diagnosis not present

## 2020-10-11 DIAGNOSIS — Z789 Other specified health status: Secondary | ICD-10-CM | POA: Diagnosis not present

## 2020-10-11 LAB — GLUCOSE, CAPILLARY
Glucose-Capillary: 145 mg/dL — ABNORMAL HIGH (ref 70–99)
Glucose-Capillary: 171 mg/dL — ABNORMAL HIGH (ref 70–99)
Glucose-Capillary: 243 mg/dL — ABNORMAL HIGH (ref 70–99)
Glucose-Capillary: 179 mg/dL — ABNORMAL HIGH (ref 70–99)

## 2020-10-11 LAB — RENAL FUNCTION PANEL
Albumin: 3 g/dL — ABNORMAL LOW (ref 3.5–5.0)
Anion gap: 12 (ref 5–15)
BUN: 59 mg/dL — ABNORMAL HIGH (ref 6–20)
CO2: 25 mmol/L (ref 22–32)
Calcium: 9.5 mg/dL (ref 8.9–10.3)
Chloride: 97 mmol/L — ABNORMAL LOW (ref 98–111)
Creatinine, Ser: 5.19 mg/dL — ABNORMAL HIGH (ref 0.61–1.24)
GFR, Estimated: 13 mL/min — ABNORMAL LOW (ref >60)
Glucose, Bld: 154 mg/dL — ABNORMAL HIGH (ref 70–99)
Phosphorus: 3.3 mg/dL (ref 2.5–4.6)
Potassium: 3.1 mmol/L — ABNORMAL LOW (ref 3.5–5.1)
Sodium: 134 mmol/L — ABNORMAL LOW (ref 135–145)

## 2020-10-11 LAB — CBC
HCT: 32.2 % — ABNORMAL LOW (ref 39.0–52.0)
Hemoglobin: 11 g/dL — ABNORMAL LOW (ref 13.0–17.0)
MCH: 34.2 pg — ABNORMAL HIGH (ref 26.0–34.0)
MCHC: 34.2 g/dL (ref 30.0–36.0)
MCV: 100 fL (ref 80.0–100.0)
Platelets: 131 10*3/uL — ABNORMAL LOW (ref 150–400)
RBC: 3.22 MIL/uL — ABNORMAL LOW (ref 4.22–5.81)
RDW: 12.8 % (ref 11.5–15.5)
WBC: 6.4 10*3/uL (ref 4.0–10.5)
nRBC: 0 % (ref 0.0–0.2)

## 2020-10-11 MED ORDER — CHLORHEXIDINE GLUCONATE CLOTH 2 % EX PADS
6.0000 | MEDICATED_PAD | Freq: Every day | CUTANEOUS | Status: DC
  Administered 2020-10-12: 6 via TOPICAL

## 2020-10-11 MED ORDER — AMLODIPINE BESYLATE 5 MG PO TABS
5.0000 mg | ORAL_TABLET | Freq: Every day | ORAL | Status: DC
  Administered 2020-10-12: 5 mg via ORAL
  Filled 2020-10-11: qty 1
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
## 2020-10-11 NOTE — Plan of Care (Signed)
  Problem: Education: Goal: Knowledge of General Education information will improve Description: Including pain rating scale, medication(s)/side effects and non-pharmacologic comfort measures 10/11/2020 0834 by Madaline Brilliant, RN Outcome: Progressing 10/11/2020 0826 by Madaline Brilliant, RN Outcome: Progressing   Problem: Health Behavior/Discharge Planning: Goal: Ability to manage health-related needs will improve 10/11/2020 0834 by Madaline Brilliant, RN Outcome: Progressing 10/11/2020 0826 by Madaline Brilliant, RN Outcome: Progressing   Problem: Clinical Measurements: Goal: Ability to maintain clinical measurements within normal limits will improve 10/11/2020 0834 by Madaline Brilliant, RN Outcome: Progressing 10/11/2020 0826 by Madaline Brilliant, RN Outcome: Progressing Goal: Will remain free from infection 10/11/2020 0834 by Madaline Brilliant, RN Outcome: Progressing 10/11/2020 0826 by Madaline Brilliant, RN Outcome: Progressing Goal: Diagnostic test results will improve 10/11/2020 0834 by Madaline Brilliant, RN Outcome: Progressing 10/11/2020 0826 by Madaline Brilliant, RN Outcome: Progressing Goal: Respiratory complications will improve 10/11/2020 0834 by Madaline Brilliant, RN Outcome: Progressing 10/11/2020 0826 by Madaline Brilliant, RN Outcome: Progressing Goal: Cardiovascular complication will be avoided 10/11/2020 0834 by Madaline Brilliant, RN Outcome: Progressing 10/11/2020 0826 by Madaline Brilliant, RN Outcome: Progressing   Problem: Activity: Goal: Risk for activity intolerance will decrease 10/11/2020 0834 by Madaline Brilliant, RN Outcome: Progressing 10/11/2020 0826 by Madaline Brilliant, RN Outcome: Progressing   Problem: Nutrition: Goal: Adequate nutrition will be maintained 10/11/2020 0834 by Madaline Brilliant, RN Outcome: Progressing 10/11/2020 0826 by Madaline Brilliant, RN Outcome: Progressing   Problem: Coping: Goal: Level of anxiety will decrease 10/11/2020 0834 by Madaline Brilliant, RN Outcome:  Progressing 10/11/2020 0826 by Madaline Brilliant, RN Outcome: Progressing   Problem: Elimination: Goal: Will not experience complications related to bowel motility 10/11/2020 0834 by Madaline Brilliant, RN Outcome: Progressing 10/11/2020 0826 by Madaline Brilliant, RN Outcome: Progressing Goal: Will not experience complications related to urinary retention 10/11/2020 0834 by Madaline Brilliant, RN Outcome: Progressing 10/11/2020 0826 by Madaline Brilliant, RN Outcome: Progressing   Problem: Pain Managment: Goal: General experience of comfort will improve 10/11/2020 0834 by Madaline Brilliant, RN Outcome: Progressing 10/11/2020 0826 by Madaline Brilliant, RN Outcome: Progressing   Problem: Safety: Goal: Ability to remain free from injury will improve 10/11/2020 0834 by Madaline Brilliant, RN Outcome: Progressing 10/11/2020 0826 by Madaline Brilliant, RN Outcome: Progressing   Problem: Skin Integrity: Goal: Risk for impaired skin integrity will decrease 10/11/2020 0834 by Madaline Brilliant, RN Outcome: Progressing 10/11/2020 0826 by Madaline Brilliant, RN Outcome: Progressing

## 2020-10-11 NOTE — Plan of Care (Signed)
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
## 2020-10-12 DIAGNOSIS — N186 End stage renal disease: Secondary | ICD-10-CM | POA: Diagnosis not present

## 2020-10-12 DIAGNOSIS — F310 Bipolar disorder, current episode hypomanic: Secondary | ICD-10-CM | POA: Diagnosis not present

## 2020-10-12 DIAGNOSIS — G9340 Encephalopathy, unspecified: Secondary | ICD-10-CM | POA: Diagnosis not present

## 2020-10-12 DIAGNOSIS — Z789 Other specified health status: Secondary | ICD-10-CM | POA: Diagnosis not present

## 2020-10-12 LAB — GLUCOSE, CAPILLARY
Glucose-Capillary: 152 mg/dL — ABNORMAL HIGH (ref 70–99)
Glucose-Capillary: 160 mg/dL — ABNORMAL HIGH (ref 70–99)
Glucose-Capillary: 179 mg/dL — ABNORMAL HIGH (ref 70–99)

## 2020-10-12 LAB — CBC
HCT: 32.1 % — ABNORMAL LOW (ref 39.0–52.0)
HCT: 31 % — ABNORMAL LOW (ref 39.0–52.0)
Hemoglobin: 10.9 g/dL — ABNORMAL LOW (ref 13.0–17.0)
Hemoglobin: 10.5 g/dL — ABNORMAL LOW (ref 13.0–17.0)
MCH: 34.1 pg — ABNORMAL HIGH (ref 26.0–34.0)
MCH: 33.8 pg (ref 26.0–34.0)
MCHC: 34 g/dL (ref 30.0–36.0)
MCHC: 33.9 g/dL (ref 30.0–36.0)
MCV: 100.3 fL — ABNORMAL HIGH (ref 80.0–100.0)
MCV: 99.7 fL (ref 80.0–100.0)
Platelets: 125 10*3/uL — ABNORMAL LOW (ref 150–400)
Platelets: 123 10*3/uL — ABNORMAL LOW (ref 150–400)
RBC: 3.2 MIL/uL — ABNORMAL LOW (ref 4.22–5.81)
RBC: 3.11 MIL/uL — ABNORMAL LOW (ref 4.22–5.81)
RDW: 12.7 % (ref 11.5–15.5)
RDW: 12.6 % (ref 11.5–15.5)
WBC: 7.1 10*3/uL (ref 4.0–10.5)
WBC: 6.2 10*3/uL (ref 4.0–10.5)
nRBC: 0 % (ref 0.0–0.2)
nRBC: 0 % (ref 0.0–0.2)

## 2020-10-12 LAB — RENAL FUNCTION PANEL
Albumin: 2.9 g/dL — ABNORMAL LOW (ref 3.5–5.0)
Albumin: 2.8 g/dL — ABNORMAL LOW (ref 3.5–5.0)
Anion gap: 14 (ref 5–15)
Anion gap: 15 (ref 5–15)
BUN: 89 mg/dL — ABNORMAL HIGH (ref 6–20)
BUN: 93 mg/dL — ABNORMAL HIGH (ref 6–20)
CO2: 23 mmol/L (ref 22–32)
CO2: 21 mmol/L — ABNORMAL LOW (ref 22–32)
Calcium: 9.4 mg/dL (ref 8.9–10.3)
Calcium: 9 mg/dL (ref 8.9–10.3)
Chloride: 96 mmol/L — ABNORMAL LOW (ref 98–111)
Chloride: 97 mmol/L — ABNORMAL LOW (ref 98–111)
Creatinine, Ser: 6.71 mg/dL — ABNORMAL HIGH (ref 0.61–1.24)
Creatinine, Ser: 6.58 mg/dL — ABNORMAL HIGH (ref 0.61–1.24)
GFR, Estimated: 9 mL/min — ABNORMAL LOW (ref >60)
GFR, Estimated: 10 mL/min — ABNORMAL LOW (ref >60)
Glucose, Bld: 157 mg/dL — ABNORMAL HIGH (ref 70–99)
Glucose, Bld: 146 mg/dL — ABNORMAL HIGH (ref 70–99)
Phosphorus: 4.1 mg/dL (ref 2.5–4.6)
Phosphorus: 3.4 mg/dL (ref 2.5–4.6)
Potassium: 3.2 mmol/L — ABNORMAL LOW (ref 3.5–5.1)
Potassium: 3.1 mmol/L — ABNORMAL LOW (ref 3.5–5.1)
Sodium: 133 mmol/L — ABNORMAL LOW (ref 135–145)
Sodium: 133 mmol/L — ABNORMAL LOW (ref 135–145)

## 2020-10-12 MED ORDER — ALTEPLASE 2 MG IJ SOLR
2.0000 mg | Freq: Once | INTRAMUSCULAR | Status: DC | PRN
  Filled 2020-10-12: qty 2

## 2020-10-12 MED ORDER — PENTAFLUOROPROP-TETRAFLUOROETH EX AERO: 1.0000 "application " | INHALATION_SPRAY | CUTANEOUS | Status: DC | PRN

## 2020-10-12 MED ORDER — LIDOCAINE HCL (PF) 1 % IJ SOLN
5.0000 mL | INTRAMUSCULAR | Status: DC | PRN
  Filled 2020-10-12: qty 5

## 2020-10-12 MED ORDER — HEPARIN SODIUM (PORCINE) 1000 UNIT/ML IJ SOLN
INTRAMUSCULAR | Status: AC
  Filled 2020-10-12: qty 2

## 2020-10-12 MED ORDER — HEPARIN SODIUM (PORCINE) 1000 UNIT/ML DIALYSIS
2000.0000 [IU] | INTRAMUSCULAR | Status: DC | PRN
  Administered 2020-10-12: 2000 [IU] via INTRAVENOUS_CENTRAL
  Filled 2020-10-12 (×2): qty 2

## 2020-10-12 MED ORDER — SODIUM CHLORIDE 0.9 % IV SOLN: 100.0000 mL | INTRAVENOUS | Status: DC | PRN

## 2020-10-12 MED ORDER — HEPARIN SODIUM (PORCINE) 1000 UNIT/ML DIALYSIS
1000.0000 [IU] | INTRAMUSCULAR | Status: DC | PRN
  Filled 2020-10-12: qty 1

## 2020-10-12 MED ORDER — LIDOCAINE-PRILOCAINE 2.5-2.5 % EX CREA
1.0000 | TOPICAL_CREAM | CUTANEOUS | Status: DC | PRN
Start: 2020-10-12 — End: 2020-10-12
  Filled 2020-10-12: qty 5

## 2020-10-12 NOTE — Procedures (Signed)
I was present at this dialysis session. I have reviewed the session itself and made appropriate changes.   Vital signs in last 24 hours:  Temp:  [97.5 F (36.4 C)-98.8 F (37.1 C)] 98.6 F (37 C) (03/04 1115) Pulse Rate:  [74-82] 77 (03/04 1230) Resp:  [16-20] 19 (03/04 1230) BP: (94-143)/(55-80) 112/59 (03/04 1230) SpO2:  [100 %] 100 % (03/04 1115) Weight:  [109.5 kg] 109.5 kg (03/04 1115) Weight change:  Filed Weights   10/10/20 1230 10/10/20 1541 10/12/20 1115  Weight: 111.1 kg 107.8 kg 109.5 kg    Recent Labs  Lab 10/12/20 1059  NA 133*  K 3.1*  CL 97*  CO2 21*  GLUCOSE 146*  BUN 93*  CREATININE 6.58*  CALCIUM 9.0  PHOS 3.4    Recent Labs  Lab 10/11/20 0329 10/12/20 0553 10/12/20 1059  WBC 6.4 7.1 6.2  HGB 11.0* 10.9* 10.5*  HCT 32.2* 32.1* 31.0*  MCV 100.0 100.3* 99.7  PLT 131* 125* 123*    Scheduled Meds: . amLODipine  5 mg Oral QHS  . atorvastatin  10 mg Oral QHS  . brimonidine  1 drop Both Eyes TID  . Chlorhexidine Gluconate Cloth  6 each Topical Q0600  . Chlorhexidine Gluconate Cloth  6 each Topical Q0600  . dorzolamide-timolol  1 drop Both Eyes BID  . folic acid  1 mg Oral Daily  . heparin injection (subcutaneous)  5,000 Units Subcutaneous Q8H  . heparin sodium (porcine)      . insulin aspart  0-15 Units Subcutaneous TID WC  . metoCLOPramide  5 mg Oral TID AC  . pantoprazole  40 mg Oral BID AC  . polyethylene glycol  17 g Oral Daily  . senna-docusate  1 tablet Oral BID  . sucralfate  1 g Oral QID  . valproic acid  750 mg Oral TID  . ziprasidone  40 mg Oral QPM   And  . ziprasidone  20 mg Oral Q breakfast   Continuous Infusions: . sodium chloride    . sodium chloride     PRN Meds:.sodium chloride, sodium chloride, acetaminophen **OR** acetaminophen, alteplase, heparin, heparin, hydrALAZINE, labetalol, lidocaine (PF), lidocaine-prilocaine, LORazepam, pentafluoroprop-tetrafluoroeth, polyethylene glycol, sorbitol   Timothy Potts,   MD 10/12/2020, 1:06 PM
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
## 2020-10-12 NOTE — Plan of Care (Signed)
  Problem: Activity: Goal: Risk for activity intolerance will decrease Outcome: Progressing   Problem: Safety: Goal: Ability to remain free from injury will improve Outcome: Progressing   

## 2020-10-12 NOTE — Consult Note (Signed)
   [facility] Inpatient Consult   10/12/2020  ADVIK MCGINLEY 1971/01/22 [id number]  [facility] [ACO] Patient: [employer]  LOS: 25 days   PCP: Allyn Kenner, MD, does the [facility] follow up  Patient screened for hospitalization with noted extreme high risk score for unplanned readmission risk and with [place] Management service for needs for post hospital transition.  Review of patient's medical record for admission and progress, it also reveals patient is for home with home health in the care of his family, currently. Plan:  Continue to follow progress and with inpatient for disposition to assess for post hospital care management needs.    For questions contact:   Natividad Brood, RN BSN [facility] Liaison  [phone] business mobile phone Toll free office [phone]  Fax number: [phone] [email or web address]
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
## 2020-10-12 NOTE — Care Management Important Message (Signed)
Important Message  Patient Details  Name: Timothy Clark MRN: [id number] Date of Birth: September 18, 1970   Medicare Important Message Given:  Yes     Karletta Millay P Rudolfo Brandow 10/12/2020, 2:22 PM

## 2020-10-12 NOTE — Plan of Care (Signed)

## 2020-10-13 DIAGNOSIS — F310 Bipolar disorder, current episode hypomanic: Secondary | ICD-10-CM | POA: Diagnosis not present

## 2020-10-13 DIAGNOSIS — G9340 Encephalopathy, unspecified: Secondary | ICD-10-CM | POA: Diagnosis not present

## 2020-10-13 DIAGNOSIS — N186 End stage renal disease: Secondary | ICD-10-CM | POA: Diagnosis not present

## 2020-10-13 DIAGNOSIS — Z789 Other specified health status: Secondary | ICD-10-CM | POA: Diagnosis not present

## 2020-10-13 LAB — GLUCOSE, CAPILLARY
Glucose-Capillary: 178 mg/dL — ABNORMAL HIGH (ref 70–99)
Glucose-Capillary: 165 mg/dL — ABNORMAL HIGH (ref 70–99)

## 2020-10-13 MED ORDER — POLYETHYLENE GLYCOL 3350 17 G PO PACK: 17.0000 g | PACK | Freq: Every day | ORAL | 0 refills | Status: AC | PRN

## 2020-10-13 MED ORDER — ZIPRASIDONE HCL 20 MG PO CAPS: ORAL_CAPSULE | ORAL | 1 refills | Status: AC

## 2020-10-13 MED ORDER — VALPROIC ACID 250 MG PO CAPS: 750.0000 mg | ORAL_CAPSULE | Freq: Three times a day (TID) | ORAL | 1 refills | Status: DC

## 2020-10-13 MED ORDER — SENNOSIDES-DOCUSATE SODIUM 8.6-50 MG PO TABS: 1.0000 | ORAL_TABLET | Freq: Two times a day (BID) | ORAL | 1 refills | Status: AC

## 2020-10-13 MED ORDER — METOCLOPRAMIDE HCL 5 MG PO TABS: 5.0000 mg | ORAL_TABLET | Freq: Three times a day (TID) | ORAL | 0 refills | Status: DC

## 2020-10-13 MED ORDER — ATORVASTATIN CALCIUM 10 MG PO TABS: 10.0000 mg | ORAL_TABLET | Freq: Every day | ORAL | 0 refills | Status: AC

## 2020-10-13 NOTE — Discharge Summary (Signed)
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 Electronically Signed   By: Margaretha Sheffield MD   On: 10/02/2020 13:33   IR US Guide Vasc Access Left  Result Date: 09/21/2020 Narrative & Impression INDICATION: 50 year old gentleman with clotted left upper arm AV dialysis graft presents to interventional radiology for attempted declot.  EXAM: Left upper extremity AV dialysis graft declot  MEDICATIONS: None.  ANESTHESIA/SEDATION: Moderate Sedation Time:  55 minutes  The patient was continuously monitored during the procedure by the interventional radiology nurse under my direct supervision.  1 mg of IV Versed and 50 mcg of IV fentanyl utilized for moderate sedation.  FLUOROSCOPY TIME:  Fluoroscopy Time: 9 minutes 6 seconds (23 mGy).  COMPLICATIONS: None immediate.   PROCEDURE: Informed written consent was obtained from the patient after a thorough discussion of the procedural risks, benefits and alternatives. All questions were addressed. Maximal Sterile Barrier Technique was utilized including caps, mask, sterile gowns, sterile gloves, sterile drape, hand hygiene and skin antiseptic. A timeout was performed prior to the initiation of the procedure.  Patient positioned supine on the procedure table. The left upper arm prepped and draped in usual fashion. Ultrasound image documenting thrombosed AV graft was obtained and placed in permanent medical record.  Sterile ultrasound gel and sterile ultrasound probe cover utilized throughout the procedure.  Utilizing continuous ultrasound guidance, antegrade access was obtained into the AV graft with a 21 gauge needle. 21 gauge needle exchanged for a transitional dilator set over 0.018 inch guidewire. Transitional dilator set exchanged for 6 French sheath over 0.035 inch guidewire.  Kumpe catheter and Glidewire utilized to gain access beyond the venous anastomosis which was clearly severely stenosed. Venogram performed under fluoroscopy showed patent left basilic vein.  The focal stenosis at the venous anastomosis was plastied with the 8 mm balloon.  Utilizing continuous ultrasound guidance, retrograde access was obtained into the AV graft with a 21 gauge needle. 21 gauge needle exchanged for a transitional dilator set over 0.018 inch guidewire. Transitional dilator set exchanged for 6 French sheath over 0.035 inch guidewire.  Kumpe the catheter advanced across the arterial anastomosis into the brachial artery. Kumpe catheter exchanged for a Fogarty balloon over a Glidewire.  3000 units of IV heparin was given.  The distal half of the graft was plasty with 8 mm balloon through the antegrade access. Arterial plug fold with Fogarty balloon through the retrograde access. Flow returned within the graft.  Fogarty balloon was  utilized to push additional thrombus centrally. Kumpe the catheter advanced across anastomosis the brachial artery. Fistulagram demonstrated no significant stenosis of the arterial inflow, anastomosis, or proximal outflow. Retrograde access sheath was removed and hemostasis achieved with a Woggle.  Moderate hemodynamically significant stenosis remained at the venous anastomosis which was plasty with a 9 mm balloon resulting in excellent luminal gain and improve flow.  Central venogram demonstrated  no significant stenosis of the basilic, axillary, subclavian, brachiocephalic veins and superior vena cava.  Antegrade sheath was removed and hemostasis achieved with pursestring suture.  IMPRESSION: Successful declot of left brachial basilic AV graft.  ACCESS: This access remains amenable to future percutaneous interventions as clinically indicated.   Electronically Signed   By: Miachel Roux M.D.   On: 09/19/2020 16:58   DG CHEST PORT 1 VIEW  Result Date: 09/22/2020 CLINICAL DATA:  Unresponsive.  Dyspnea. EXAM: PORTABLE CHEST 1 VIEW COMPARISON:  09/17/2020 FINDINGS: Cardiac silhouette normal in size. No mediastinal or hilar masses. Clear lungs. No convincing pleural effusion and no pneumothorax. Skeletal structures are grossly intact. IMPRESSION: No active disease. Electronically Signed   By: Lajean Manes M.D.   On: 09/22/2020 15:34   DG Chest Portable 1 View  Result Date: 09/17/2020 CLINICAL DATA:  Chest pain, CHF EXAM: PORTABLE CHEST 1 VIEW COMPARISON:  Chest radiograph July 22, 2020 FINDINGS: Apparent cardiac enlargement due to technique. Low lung volumes. No focal consolidation. No pleural effusion. No pneumothorax. The visualized skeletal structures are unchanged. IMPRESSION: 1. Low lung volumes with bibasilar subsegmental atelectasis. No focal consolidation. Electronically Signed   By: Dahlia Bailiff MD   On: 09/17/2020 15:29   IR THROMBECTOMY AV FISTULA W/THROMBOLYSIS/PTA INC/SHUNT/IMG  LEFT  Result Date: 09/19/2020 INDICATION: 50 year old gentleman with clotted left upper arm AV dialysis graft presents to interventional radiology for attempted declot. EXAM: Left upper extremity AV dialysis graft declot MEDICATIONS: None. ANESTHESIA/SEDATION: Moderate Sedation Time:  55 minutes The patient was continuously monitored during the procedure by the interventional radiology nurse under my direct supervision. 1 mg of IV Versed and 50 mcg of IV fentanyl utilized for moderate sedation. FLUOROSCOPY TIME:  Fluoroscopy Time: 9 minutes 6 seconds (23 mGy). COMPLICATIONS: None immediate. PROCEDURE: Informed written consent was obtained from the patient after a thorough discussion of the procedural risks, benefits and alternatives. All questions were addressed. Maximal Sterile Barrier Technique was utilized including caps, mask, sterile gowns, sterile gloves, sterile drape, hand hygiene and skin antiseptic. A timeout was performed prior to the initiation of the procedure. Patient positioned supine on the procedure table. The left upper arm prepped and draped in usual fashion. Ultrasound image documenting thrombosed AV graft was obtained and placed in permanent medical record. Sterile ultrasound gel and sterile ultrasound probe cover utilized throughout the procedure. Utilizing continuous ultrasound guidance, antegrade access was obtained into the AV graft with a 21 gauge needle. 21 gauge needle exchanged for a transitional dilator set over 0.018 inch guidewire. Transitional dilator set exchanged for 6 French sheath over 0.035 inch guidewire. Kumpe catheter and Glidewire utilized to gain access beyond the venous anastomosis which was clearly severely stenosed. Venogram performed under fluoroscopy showed patent left basilic vein. The focal stenosis at the venous anastomosis was plastied with the 8 mm balloon. Utilizing continuous ultrasound guidance, retrograde access was obtained into the AV graft with a 21 gauge  needle. 21 gauge needle exchanged for a transitional dilator set over 0.018 inch guidewire. Transitional dilator set exchanged for 6 French sheath over 0.035 inch guidewire. Kumpe the catheter advanced across the arterial anastomosis into the brachial artery. Kumpe catheter exchanged for a Fogarty balloon over a Glidewire. 3000 units of IV heparin was given. The distal half of the graft was plasty with 8 mm balloon through the antegrade access. Arterial plug fold with Fogarty balloon through the retrograde access. Flow returned within the graft. Fogarty balloon was utilized to push additional thrombus centrally. Kumpe the  catheter advanced across anastomosis the brachial artery. Fistulagram demonstrated no significant stenosis of the arterial inflow, anastomosis, or proximal outflow. Retrograde access sheath was removed and hemostasis achieved with a Woggle. Moderate hemodynamically significant stenosis remained at the venous anastomosis which was plasty with a 9 mm balloon resulting in excellent luminal gain and improve flow. Central venogram demonstrated no significant stenosis of the basilic, axillary, subclavian, brachiocephalic veins and superior vena cava. Antegrade sheath was removed and hemostasis achieved with pursestring suture. IMPRESSION: Successful declot of left brachial basilic AV graft. ACCESS: This access remains amenable to future percutaneous interventions as clinically indicated. Electronically Signed   By: Miachel Roux M.D.   On: 09/19/2020 16:58     Subjective: Pt without complaints, he is agreeable to going home today with sister.  He will go to his outpatient HD on Monday on schedule  Discharge Exam: Vitals:   10/13/20 0330 10/13/20 0726  BP: 118/76 110/65  Pulse: 80 69  Resp: 15 17  Temp: 98 F (36.7 C) (!) 97.3 F (36.3 C)  SpO2: 100% 100%   Vitals:   10/12/20 1558 10/12/20 2008 10/13/20 0330 10/13/20 0726  BP: 136/70 114/78 118/76 110/65  Pulse: 83 83 80 69  Resp: _0 Temp: 97.7 F (36.5 C) 98 F (36.7 C) 98 F (36.7 C) (!) 97.3 F (36.3 C)  TempSrc: Oral Oral Oral Oral  SpO2: 100% 100% 100% 100%  Weight:      Height:       Examination: General exam: awake, alert, NAD, cooperative.  HEENT: MMM.   Respiratory system: BBS no rales, no crackles.  Cardiovascular system: normal s1, s2 sounds. Gastrointestinal system: Abdomen soft, NT,ND, BS+ Nervous System:  Alert, awake, able to move all his extremities.  Extremities: No edema, distal peripheral pulses palpable.  Skin: No rashes,no icterus. MSK: no gross abnormalities.    The results of significant diagnostics from this hospitalization (including imaging, microbiology, ancillary and laboratory) are listed below for reference.     Microbiology: No results found for this or any previous visit (from the past 240 hour(s)).   Labs: BNP (last 3 results) No results for input(s): BNP in the last 8760 hours. Basic Metabolic Panel: Recent Labs  Lab 10/08/20 0734 10/10/20 1350 10/11/20 0329 10/12/20 0553 10/12/20 1059  NA 131* 134* 134* 133* 133*  K 3.1* 2.8* 3.1* 3.2* 3.1*  CL 96* 99 97* 96* 97*  CO2 20* 21* 25 23 21*  GLUCOSE 144* 145* 154* 157* 146*  BUN 103* 94* 59* 89* 93*  CREATININE 7.48* 6.17* 5.19* 6.71* 6.58*  CALCIUM 9.0 9.3 9.5 9.4 9.0  PHOS 4.0 3.2 3.3 4.1 3.4   Liver Function Tests: Recent Labs  Lab 10/08/20 0734 10/10/20 1350 10/11/20 0329 10/12/20 0553 10/12/20 1059  ALBUMIN 2.8* 2.8* 3.0* 2.9* 2.8*   No results for input(s): LIPASE, AMYLASE in the last 168 hours. No results for input(s): AMMONIA in the last 168 hours. CBC: Recent Labs  Lab 10/08/20 0734 10/10/20 1350 10/11/20 0329 10/12/20 0553 10/12/20 1059  WBC 5.6 6.1 6.4 7.1 6.2  HGB 10.9* 10.4* 11.0* 10.9* 10.5*  HCT 31.3* 28.9* 32.2* 32.1* 31.0*  MCV 99.4 99.3 100.0 100.3* 99.7  PLT 149* 143* 131* 125* 123*   Cardiac Enzymes: No results for input(s): CKTOTAL, CKMB, CKMBINDEX, TROPONINI  in the last 168 hours. BNP: Invalid input(s): POCBNP CBG: Recent Labs  Lab 10/12/20 0624 10/12/20 1707 10/12/20 2010 10/13/20 0643 10/13/20 1131  GLUCAP 152* 160* 179*  178* 165*   D-Dimer No results for input(s): DDIMER in the last 72 hours. Hgb A1c No results for input(s): HGBA1C in the last 72 hours. Lipid Profile No results for input(s): CHOL, HDL, LDLCALC, TRIG, CHOLHDL, LDLDIRECT in the last 72 hours. Thyroid function studies No results for input(s): TSH, T4TOTAL, T3FREE, THYROIDAB in the last 72 hours.  Invalid input(s): FREET3 Anemia work up No results for input(s): VITAMINB12, FOLATE, FERRITIN, TIBC, IRON, RETICCTPCT in the last 72 hours. Urinalysis    Component Value Date/Time   COLORURINE YELLOW 10/24/2018 1424   APPEARANCEUR CLEAR 10/24/2018 1424   LABSPEC 1.014 10/24/2018 1424   PHURINE 5.0 10/24/2018 1424   GLUCOSEU >=500 (A) 10/24/2018 1424   HGBUR MODERATE (A) 10/24/2018 1424   BILIRUBINUR NEGATIVE 10/24/2018 1424   KETONESUR NEGATIVE 10/24/2018 1424   PROTEINUR >=300 (A) 10/24/2018 1424   UROBILINOGEN 0.2 06/07/2015 1353   NITRITE NEGATIVE 10/24/2018 1424   LEUKOCYTESUR NEGATIVE 10/24/2018 1424   Sepsis Labs Invalid input(s): PROCALCITONIN,  WBC,  LACTICIDVEN Microbiology No results found for this or any previous visit (from the past 240 hour(s)).  Time coordinating discharge:  48 mins   SIGNED:  Irwin Brakeman, MD  [facility] 10/13/2020, 11:36 AM How to contact the [facility] Attending or Consulting provider [place] or covering provider during after hours [place], for this patient?  1. Check the care team in [facility] and look for a) attending/consulting [facility] provider listed and b) the [facility] team listed 2. Log into [email or web address] and use [place]'s universal password to access. If you do not have the password, please contact the hospital operator. 3. Locate the [facility] provider you are looking for under [facility] and page to a number that you can  be directly reached. 4. If you still have difficulty reaching the provider, please page the [facility] (Director on Call) for the Hospitalists listed on amion for assistance.

## 2020-10-13 NOTE — Plan of Care (Signed)
  Problem: Health Behavior/Discharge Planning: Goal: Ability to manage health-related needs will improve Outcome: Progressing   Problem: Activity: Goal: Risk for activity intolerance will decrease Outcome: Adequate for Discharge

## 2020-10-13 NOTE — Progress Notes (Signed)
Patient ID: Timothy Clark, male   DOB: Aug 24, 1970, 50 y.o.   MRN: [id number] S: Feeling better no complaints O:BP 110/65 (BP Location: Right Arm)   Pulse 69   Temp (!) 97.3 F (36.3 C) (Oral)   Resp 17   Ht '5\' 11"'$  (1.803 m)   Wt 107.5 kg   SpO2 100%   BMI 33.05 kg/m   Intake/Output Summary (Last 24 hours) at 10/13/2020 1208 Last data filed at 10/13/2020 0830 Gross per 24 hour  Intake 475 ml  Output 2000 ml  Net -1525 ml   Intake/Output: I/O last 3 completed shifts: In: 440 [P.O.:440] Out: 2710 [Urine:710; Other:2000]  Intake/Output this shift:  Total I/O In: 275 [P.O.:275] Out: -  Weight change:  Gen: NAD CVS: RRR Resp: cta Abd: benign  Ext:no edema, LUE AVG +T/B  Recent Labs  Lab 10/07/20 0418 10/08/20 0734 10/10/20 1350 10/11/20 0329 10/12/20 0553 10/12/20 1059  NA 134* 131* 134* 134* 133* 133*  K 3.4* 3.1* 2.8* 3.1* 3.2* 3.1*  CL 95* 96* 99 97* 96* 97*  CO2 21* 20* 21* 25 23 21*  GLUCOSE 136* 144* 145* 154* 157* 146*  BUN 83* 103* 94* 59* 89* 93*  CREATININE 7.79* 7.48* 6.17* 5.19* 6.71* 6.58*  ALBUMIN  --  2.8* 2.8* 3.0* 2.9* 2.8*  CALCIUM 9.0 9.0 9.3 9.5 9.4 9.0  PHOS  --  4.0 3.2 3.3 4.1 3.4   Liver Function Tests: Recent Labs  Lab 10/11/20 0329 10/12/20 0553 10/12/20 1059  ALBUMIN 3.0* 2.9* 2.8*   No results for input(s): LIPASE, AMYLASE in the last 168 hours. No results for input(s): AMMONIA in the last 168 hours. CBC: Recent Labs  Lab 10/08/20 0734 10/10/20 1350 10/11/20 0329 10/12/20 0553 10/12/20 1059  WBC 5.6 6.1 6.4 7.1 6.2  HGB 10.9* 10.4* 11.0* 10.9* 10.5*  HCT 31.3* 28.9* 32.2* 32.1* 31.0*  MCV 99.4 99.3 100.0 100.3* 99.7  PLT 149* 143* 131* 125* 123*   Cardiac Enzymes: No results for input(s): CKTOTAL, CKMB, CKMBINDEX, TROPONINI in the last 168 hours. CBG: Recent Labs  Lab 10/12/20 0624 10/12/20 1707 10/12/20 2010 10/13/20 0643 10/13/20 1131  GLUCAP 152* 160* 179* 178* 165*    Iron Studies: No results for  input(s): IRON, TIBC, TRANSFERRIN, FERRITIN in the last 72 hours. Studies/Results: No results found. [name] amLODipine  5 mg Oral QHS  . atorvastatin  10 mg Oral QHS  . brimonidine  1 drop Both Eyes TID  . Chlorhexidine Gluconate Cloth  6 each Topical Q0600  . Chlorhexidine Gluconate Cloth  6 each Topical Q0600  . dorzolamide-timolol  1 drop Both Eyes BID  . folic acid  1 mg Oral Daily  . heparin injection (subcutaneous)  5,000 Units Subcutaneous Q8H  . insulin aspart  0-15 Units Subcutaneous TID WC  . metoCLOPramide  5 mg Oral TID AC  . pantoprazole  40 mg Oral BID AC  . polyethylene glycol  17 g Oral Daily  . senna-docusate  1 tablet Oral BID  . sucralfate  1 g Oral QID  . valproic acid  750 mg Oral TID  . ziprasidone  40 mg Oral QPM   And  . ziprasidone  20 mg Oral Q breakfast    BMET    Component Value Date/Time   NA 133 (L) 10/12/2020 1059   K 3.1 (L) 10/12/2020 1059   CL 97 (L) 10/12/2020 1059   CO2 21 (L) 10/12/2020 1059   GLUCOSE 146 (H) 10/12/2020 1059   BUN  93 (H) 10/12/2020 1059   CREATININE 6.58 (H) 10/12/2020 1059   CREATININE 1.20 06/22/2017 1436   CALCIUM 9.0 10/12/2020 1059   GFRNONAA 10 (L) 10/12/2020 1059   GFRAA 12 (L) 03/20/2020 1127   CBC    Component Value Date/Time   WBC 6.2 10/12/2020 1059   RBC 3.11 (L) 10/12/2020 1059   HGB 10.5 (L) 10/12/2020 1059   HCT 31.0 (L) 10/12/2020 1059   PLT 123 (L) 10/12/2020 1059   MCV 99.7 10/12/2020 1059   MCH 33.8 10/12/2020 1059   MCHC 33.9 10/12/2020 1059   RDW 12.6 10/12/2020 1059   LYMPHSABS 1.4 09/17/2020 1639   MONOABS 0.6 09/17/2020 1639   EOSABS 0.1 09/17/2020 1639   BASOSABS 0.0 09/17/2020 1639    [facility] EDW127kgHD [facility] 3K/2.5CaTime 4:15Heparin1000unit bolus then 600 units/hr. AccessLUE AVGBFR 400DFR 600  Hectoral 29mg IV/HD Epogen1000Units IV/HD Venofer 50 mg IV weekly  Brief history: This is a 50year old gentleman with a history of diabetes and  end-stage renal disease Monday Wednesday [place] He also has a history of congestive heart failure with diastolic dysfunction. He was brought to the emergency room with a change in behavior. Violent activity towards his family members.  Assessment/Plan: 1. ESRD -On HD MWF.  Orders written for HD tomorrow per regular schedule..   2. Clotted L UA AVGG - sp declot by IR on 09/19/20.  Now with positive thrill/bruit.  Using for HD.  3. HTN/volume -BP in goal.Continue amlodipine '5mg'$  qhs.  Does not appear grossly volume overloaded.  Under EDW if weights correct, will need new ED on d/c.  Continue to titrate down as tolerated. Weights 108-111kg.  4. Anemia -Hgb 11.0. No indication for ESA at this time.  5. Secondary hyperparathyroidism -Calcium and phos in goal.  No binders or VDRA. 6. Acute metabolic encephalopathy, suicidal and homicidal attempt with history of bipolar mood disorder: Seen by neurology.Plan per primary team. Please avoid buspirone in ESRD patient 7. Seizure disorder - per primary 8. Abd pain/gastroparesis/gastritis - on PPI, Reglan, started on carafate (should be limited to 6-8 weeks in dialysis patient) 9. Prolonged QT 10. Disposition- pt not eligible for SNF and will need to be strong enough [facility].   JDonetta Potts MD [employer]([phone]

## 2020-10-13 NOTE — Discharge Instructions (Signed)
Please resume your regular hemodialysis schedule on Monday as already arranged.    IMPORTANT INFORMATION: PAY CLOSE ATTENTION   PHYSICIAN DISCHARGE INSTRUCTIONS  Follow with Primary care provider  [name], MD  and other consultants as instructed by your Hospitalist Physician  [place] IF SYMPTOMS COME BACK, WORSEN OR NEW PROBLEM DEVELOPS   Please note: You were cared for by a hospitalist during your hospital stay. Every effort will be made to forward records to your primary care provider.  You can request that your primary care provider send for your hospital records if they have not received them.  Once you are discharged, your primary care physician will handle any further medical issues. Please note that NO REFILLS for any discharge medications will be authorized once you are discharged, as it is imperative that you return to your primary care physician (or establish a relationship with a primary care physician if you do not have one) for your post hospital discharge needs so that they can reassess your need for medications and monitor your lab values.  Please get a complete blood count and chemistry panel checked by your Primary MD at your next visit, and again as instructed by your Primary MD.  Get Medicines reviewed and adjusted: Please take all your medications with you for your next visit with your Primary MD  Laboratory/radiological data: Please request your Primary MD to go over all hospital tests and procedure/radiological results at the follow up, please ask your primary care provider to get all Hospital records sent to his/her office.  In some cases, they will be blood work, cultures and biopsy results pending at the time of your discharge. Please request that your primary care provider follow up on these results.  If you are diabetic, please bring your blood sugar readings with you to your follow up appointment with primary care.     Please call and make your follow up appointments as soon as possible.    Also Note the following: If you experience worsening of your admission symptoms, develop shortness of breath, life threatening emergency, suicidal or homicidal thoughts you must seek medical attention immediately by calling 911 or calling your MD immediately  if symptoms less severe.  You must read complete instructions/literature along with all the possible adverse reactions/side effects for all the Medicines you take and that have been prescribed to you. Take any new Medicines after you have completely understood and accpet all the possible adverse reactions/side effects.   Do not drive when taking Pain medications or sleeping medications (Benzodiazepines)  Do not take more than prescribed Pain, Sleep and Anxiety Medications. It is not advisable to combine anxiety,sleep and pain medications without talking with your primary care practitioner  Special Instructions: If you have smoked or chewed Tobacco  in the last 2 yrs please stop smoking, stop any regular Alcohol  and or any Recreational drug use.  Wear Seat belts while driving.  Do not drive if taking any narcotic, mind altering or controlled substances or recreational drugs or alcohol.

## 2020-10-13 NOTE — Progress Notes (Signed)
Received referral to assist with DME (RW, W/C, 3-in-BSC). Contacted Freda Munro with [facility] for DME referral. Insurance won't pay for a W/C and a RW. Pt reports that he is going to pay for the RW.   [place] to request fax #. Spoke with [place], RN, informed her that pt has been D/C. She provided the fax # [phone]. Faxed HH orders and D/C summary.

## 2020-10-13 NOTE — Progress Notes (Signed)
Pt given discharge instructions and gone over with him and sisters. All questions answered. Pt took wheelchair, 3n1, and walker home. All belongings gathered to be sent home. Pt in no distress at discharge.

## 2020-10-15 DIAGNOSIS — N186 End stage renal disease: Secondary | ICD-10-CM | POA: Diagnosis not present

## 2020-10-15 DIAGNOSIS — Z992 Dependence on renal dialysis: Secondary | ICD-10-CM | POA: Diagnosis not present

## 2020-10-16 DIAGNOSIS — E1122 Type 2 diabetes mellitus with diabetic chronic kidney disease: Secondary | ICD-10-CM | POA: Diagnosis not present

## 2020-10-16 DIAGNOSIS — E1143 Type 2 diabetes mellitus with diabetic autonomic (poly)neuropathy: Secondary | ICD-10-CM | POA: Diagnosis not present

## 2020-10-16 DIAGNOSIS — I5032 Chronic diastolic (congestive) heart failure: Secondary | ICD-10-CM | POA: Diagnosis not present

## 2020-10-16 DIAGNOSIS — D631 Anemia in chronic kidney disease: Secondary | ICD-10-CM | POA: Diagnosis not present

## 2020-10-16 DIAGNOSIS — N186 End stage renal disease: Secondary | ICD-10-CM | POA: Diagnosis not present

## 2020-10-16 DIAGNOSIS — I132 Hypertensive heart and chronic kidney disease with heart failure and with stage 5 chronic kidney disease, or end stage renal disease: Secondary | ICD-10-CM | POA: Diagnosis not present

## 2020-10-17 DIAGNOSIS — I132 Hypertensive heart and chronic kidney disease with heart failure and with stage 5 chronic kidney disease, or end stage renal disease: Secondary | ICD-10-CM | POA: Diagnosis not present

## 2020-10-17 DIAGNOSIS — I5032 Chronic diastolic (congestive) heart failure: Secondary | ICD-10-CM | POA: Diagnosis not present

## 2020-10-17 DIAGNOSIS — N186 End stage renal disease: Secondary | ICD-10-CM | POA: Diagnosis not present

## 2020-10-17 DIAGNOSIS — E1122 Type 2 diabetes mellitus with diabetic chronic kidney disease: Secondary | ICD-10-CM | POA: Diagnosis not present

## 2020-10-17 DIAGNOSIS — E1143 Type 2 diabetes mellitus with diabetic autonomic (poly)neuropathy: Secondary | ICD-10-CM | POA: Diagnosis not present

## 2020-10-17 DIAGNOSIS — Z992 Dependence on renal dialysis: Secondary | ICD-10-CM | POA: Diagnosis not present

## 2020-10-17 DIAGNOSIS — D631 Anemia in chronic kidney disease: Secondary | ICD-10-CM | POA: Diagnosis not present

## 2020-10-18 DIAGNOSIS — E1122 Type 2 diabetes mellitus with diabetic chronic kidney disease: Secondary | ICD-10-CM | POA: Diagnosis not present

## 2020-10-18 DIAGNOSIS — F209 Schizophrenia, unspecified: Secondary | ICD-10-CM | POA: Diagnosis not present

## 2020-10-18 DIAGNOSIS — F319 Bipolar disorder, unspecified: Secondary | ICD-10-CM | POA: Diagnosis not present

## 2020-10-18 DIAGNOSIS — R569 Unspecified convulsions: Secondary | ICD-10-CM | POA: Diagnosis not present

## 2020-10-18 DIAGNOSIS — R531 Weakness: Secondary | ICD-10-CM | POA: Diagnosis not present

## 2020-10-18 DIAGNOSIS — M542 Cervicalgia: Secondary | ICD-10-CM | POA: Diagnosis not present

## 2020-10-18 DIAGNOSIS — Z992 Dependence on renal dialysis: Secondary | ICD-10-CM | POA: Diagnosis not present

## 2020-10-18 DIAGNOSIS — I132 Hypertensive heart and chronic kidney disease with heart failure and with stage 5 chronic kidney disease, or end stage renal disease: Secondary | ICD-10-CM | POA: Diagnosis not present

## 2020-10-18 DIAGNOSIS — I10 Essential (primary) hypertension: Secondary | ICD-10-CM | POA: Diagnosis not present

## 2020-10-19 DIAGNOSIS — N186 End stage renal disease: Secondary | ICD-10-CM | POA: Diagnosis not present

## 2020-10-19 DIAGNOSIS — Z992 Dependence on renal dialysis: Secondary | ICD-10-CM | POA: Diagnosis not present

## 2020-10-22 ENCOUNTER — Ambulatory Visit: Payer: Medicare HMO | Admitting: Podiatry

## 2020-10-22 DIAGNOSIS — Z992 Dependence on renal dialysis: Secondary | ICD-10-CM | POA: Diagnosis not present

## 2020-10-22 DIAGNOSIS — N186 End stage renal disease: Secondary | ICD-10-CM | POA: Diagnosis not present

## 2020-10-23 ENCOUNTER — Other Ambulatory Visit: Payer: Medicare HMO

## 2020-10-23 DIAGNOSIS — E1122 Type 2 diabetes mellitus with diabetic chronic kidney disease: Secondary | ICD-10-CM | POA: Diagnosis not present

## 2020-10-23 DIAGNOSIS — D631 Anemia in chronic kidney disease: Secondary | ICD-10-CM | POA: Diagnosis not present

## 2020-10-23 DIAGNOSIS — I132 Hypertensive heart and chronic kidney disease with heart failure and with stage 5 chronic kidney disease, or end stage renal disease: Secondary | ICD-10-CM | POA: Diagnosis not present

## 2020-10-23 DIAGNOSIS — E1143 Type 2 diabetes mellitus with diabetic autonomic (poly)neuropathy: Secondary | ICD-10-CM | POA: Diagnosis not present

## 2020-10-23 DIAGNOSIS — I5032 Chronic diastolic (congestive) heart failure: Secondary | ICD-10-CM | POA: Diagnosis not present

## 2020-10-23 DIAGNOSIS — N186 End stage renal disease: Secondary | ICD-10-CM | POA: Diagnosis not present

## 2020-10-24 DIAGNOSIS — N186 End stage renal disease: Secondary | ICD-10-CM | POA: Diagnosis not present

## 2020-10-24 DIAGNOSIS — Z992 Dependence on renal dialysis: Secondary | ICD-10-CM | POA: Diagnosis not present

## 2020-10-25 DIAGNOSIS — D509 Iron deficiency anemia, unspecified: Secondary | ICD-10-CM | POA: Diagnosis not present

## 2020-10-25 DIAGNOSIS — D519 Vitamin B12 deficiency anemia, unspecified: Secondary | ICD-10-CM | POA: Diagnosis not present

## 2020-10-25 DIAGNOSIS — D631 Anemia in chronic kidney disease: Secondary | ICD-10-CM | POA: Diagnosis not present

## 2020-10-25 DIAGNOSIS — E785 Hyperlipidemia, unspecified: Secondary | ICD-10-CM | POA: Diagnosis not present

## 2020-10-25 DIAGNOSIS — N189 Chronic kidney disease, unspecified: Secondary | ICD-10-CM | POA: Diagnosis not present

## 2020-10-25 DIAGNOSIS — I120 Hypertensive chronic kidney disease with stage 5 chronic kidney disease or end stage renal disease: Secondary | ICD-10-CM | POA: Diagnosis not present

## 2020-10-25 DIAGNOSIS — E1122 Type 2 diabetes mellitus with diabetic chronic kidney disease: Secondary | ICD-10-CM | POA: Diagnosis not present

## 2020-10-25 DIAGNOSIS — E11319 Type 2 diabetes mellitus with unspecified diabetic retinopathy without macular edema: Secondary | ICD-10-CM | POA: Diagnosis not present

## 2020-10-26 DIAGNOSIS — I132 Hypertensive heart and chronic kidney disease with heart failure and with stage 5 chronic kidney disease, or end stage renal disease: Secondary | ICD-10-CM | POA: Diagnosis not present

## 2020-10-26 DIAGNOSIS — E1143 Type 2 diabetes mellitus with diabetic autonomic (poly)neuropathy: Secondary | ICD-10-CM | POA: Diagnosis not present

## 2020-10-26 DIAGNOSIS — Z992 Dependence on renal dialysis: Secondary | ICD-10-CM | POA: Diagnosis not present

## 2020-10-26 DIAGNOSIS — D631 Anemia in chronic kidney disease: Secondary | ICD-10-CM | POA: Diagnosis not present

## 2020-10-26 DIAGNOSIS — I5032 Chronic diastolic (congestive) heart failure: Secondary | ICD-10-CM | POA: Diagnosis not present

## 2020-10-26 DIAGNOSIS — E1122 Type 2 diabetes mellitus with diabetic chronic kidney disease: Secondary | ICD-10-CM | POA: Diagnosis not present

## 2020-10-26 DIAGNOSIS — N186 End stage renal disease: Secondary | ICD-10-CM | POA: Diagnosis not present

## 2020-10-29 ENCOUNTER — Ambulatory Visit: Payer: Medicare HMO | Admitting: Podiatry

## 2020-10-29 DIAGNOSIS — N186 End stage renal disease: Secondary | ICD-10-CM | POA: Diagnosis not present

## 2020-10-29 DIAGNOSIS — Z992 Dependence on renal dialysis: Secondary | ICD-10-CM | POA: Diagnosis not present

## 2020-10-30 DIAGNOSIS — I132 Hypertensive heart and chronic kidney disease with heart failure and with stage 5 chronic kidney disease, or end stage renal disease: Secondary | ICD-10-CM | POA: Diagnosis not present

## 2020-10-30 DIAGNOSIS — E1122 Type 2 diabetes mellitus with diabetic chronic kidney disease: Secondary | ICD-10-CM | POA: Diagnosis not present

## 2020-10-30 DIAGNOSIS — E1143 Type 2 diabetes mellitus with diabetic autonomic (poly)neuropathy: Secondary | ICD-10-CM | POA: Diagnosis not present

## 2020-10-30 DIAGNOSIS — D631 Anemia in chronic kidney disease: Secondary | ICD-10-CM | POA: Diagnosis not present

## 2020-10-30 DIAGNOSIS — I5032 Chronic diastolic (congestive) heart failure: Secondary | ICD-10-CM | POA: Diagnosis not present

## 2020-10-30 DIAGNOSIS — N186 End stage renal disease: Secondary | ICD-10-CM | POA: Diagnosis not present

## 2020-10-31 DIAGNOSIS — N186 End stage renal disease: Secondary | ICD-10-CM | POA: Diagnosis not present

## 2020-10-31 DIAGNOSIS — D631 Anemia in chronic kidney disease: Secondary | ICD-10-CM | POA: Diagnosis not present

## 2020-10-31 DIAGNOSIS — Z992 Dependence on renal dialysis: Secondary | ICD-10-CM | POA: Diagnosis not present

## 2020-11-01 DIAGNOSIS — D509 Iron deficiency anemia, unspecified: Secondary | ICD-10-CM | POA: Diagnosis not present

## 2020-11-01 DIAGNOSIS — R569 Unspecified convulsions: Secondary | ICD-10-CM | POA: Diagnosis not present

## 2020-11-01 DIAGNOSIS — I5032 Chronic diastolic (congestive) heart failure: Secondary | ICD-10-CM | POA: Diagnosis not present

## 2020-11-01 DIAGNOSIS — D631 Anemia in chronic kidney disease: Secondary | ICD-10-CM | POA: Diagnosis not present

## 2020-11-01 DIAGNOSIS — F411 Generalized anxiety disorder: Secondary | ICD-10-CM | POA: Diagnosis not present

## 2020-11-01 DIAGNOSIS — R451 Restlessness and agitation: Secondary | ICD-10-CM | POA: Diagnosis not present

## 2020-11-01 DIAGNOSIS — I129 Hypertensive chronic kidney disease with stage 1 through stage 4 chronic kidney disease, or unspecified chronic kidney disease: Secondary | ICD-10-CM | POA: Diagnosis not present

## 2020-11-01 DIAGNOSIS — E1122 Type 2 diabetes mellitus with diabetic chronic kidney disease: Secondary | ICD-10-CM | POA: Diagnosis not present

## 2020-11-01 DIAGNOSIS — Z0001 Encounter for general adult medical examination with abnormal findings: Secondary | ICD-10-CM | POA: Diagnosis not present

## 2020-11-01 DIAGNOSIS — E1143 Type 2 diabetes mellitus with diabetic autonomic (poly)neuropathy: Secondary | ICD-10-CM | POA: Diagnosis not present

## 2020-11-01 DIAGNOSIS — I132 Hypertensive heart and chronic kidney disease with heart failure and with stage 5 chronic kidney disease, or end stage renal disease: Secondary | ICD-10-CM | POA: Diagnosis not present

## 2020-11-01 DIAGNOSIS — N186 End stage renal disease: Secondary | ICD-10-CM | POA: Diagnosis not present

## 2020-11-01 DIAGNOSIS — R143 Flatulence: Secondary | ICD-10-CM | POA: Diagnosis not present

## 2020-11-02 DIAGNOSIS — Z992 Dependence on renal dialysis: Secondary | ICD-10-CM | POA: Diagnosis not present

## 2020-11-02 DIAGNOSIS — N186 End stage renal disease: Secondary | ICD-10-CM | POA: Diagnosis not present

## 2020-11-05 DIAGNOSIS — N186 End stage renal disease: Secondary | ICD-10-CM | POA: Diagnosis not present

## 2020-11-05 DIAGNOSIS — I871 Compression of vein: Secondary | ICD-10-CM | POA: Diagnosis not present

## 2020-11-05 DIAGNOSIS — T82868A Thrombosis of vascular prosthetic devices, implants and grafts, initial encounter: Secondary | ICD-10-CM | POA: Diagnosis not present

## 2020-11-05 DIAGNOSIS — Z992 Dependence on renal dialysis: Secondary | ICD-10-CM | POA: Diagnosis not present

## 2020-11-06 ENCOUNTER — Ambulatory Visit (INDEPENDENT_AMBULATORY_CARE_PROVIDER_SITE_OTHER): Payer: Medicare HMO | Admitting: Ophthalmology

## 2020-11-06 ENCOUNTER — Other Ambulatory Visit: Payer: Self-pay

## 2020-11-06 ENCOUNTER — Encounter (INDEPENDENT_AMBULATORY_CARE_PROVIDER_SITE_OTHER): Payer: Self-pay | Admitting: Ophthalmology

## 2020-11-06 DIAGNOSIS — E1122 Type 2 diabetes mellitus with diabetic chronic kidney disease: Secondary | ICD-10-CM | POA: Diagnosis not present

## 2020-11-06 DIAGNOSIS — E113551 Type 2 diabetes mellitus with stable proliferative diabetic retinopathy, right eye: Secondary | ICD-10-CM | POA: Diagnosis not present

## 2020-11-06 DIAGNOSIS — E113552 Type 2 diabetes mellitus with stable proliferative diabetic retinopathy, left eye: Secondary | ICD-10-CM | POA: Diagnosis not present

## 2020-11-06 DIAGNOSIS — Z794 Long term (current) use of insulin: Secondary | ICD-10-CM

## 2020-11-06 DIAGNOSIS — E113512 Type 2 diabetes mellitus with proliferative diabetic retinopathy with macular edema, left eye: Secondary | ICD-10-CM

## 2020-11-06 DIAGNOSIS — H409 Unspecified glaucoma: Secondary | ICD-10-CM

## 2020-11-06 DIAGNOSIS — D631 Anemia in chronic kidney disease: Secondary | ICD-10-CM | POA: Diagnosis not present

## 2020-11-06 DIAGNOSIS — I5032 Chronic diastolic (congestive) heart failure: Secondary | ICD-10-CM | POA: Diagnosis not present

## 2020-11-06 DIAGNOSIS — E113553 Type 2 diabetes mellitus with stable proliferative diabetic retinopathy, bilateral: Secondary | ICD-10-CM

## 2020-11-06 DIAGNOSIS — H4720 Unspecified optic atrophy: Secondary | ICD-10-CM | POA: Diagnosis not present

## 2020-11-06 DIAGNOSIS — N186 End stage renal disease: Secondary | ICD-10-CM | POA: Diagnosis not present

## 2020-11-06 DIAGNOSIS — I132 Hypertensive heart and chronic kidney disease with heart failure and with stage 5 chronic kidney disease, or end stage renal disease: Secondary | ICD-10-CM | POA: Diagnosis not present

## 2020-11-06 DIAGNOSIS — E1143 Type 2 diabetes mellitus with diabetic autonomic (poly)neuropathy: Secondary | ICD-10-CM | POA: Diagnosis not present

## 2020-11-06 NOTE — Assessment & Plan Note (Signed)

## 2020-11-06 NOTE — Progress Notes (Signed)
11/06/2020     CHIEF COMPLAINT Patient presents for Retina Follow Up (4 Mo F/U OU//Pt c/o worsening VA slightly OS since last visit. No other new symptoms reported OU.)   HISTORY OF PRESENT ILLNESS: Timothy Clark is a 50 y.o. male who presents to the clinic today for:   HPI    Retina Follow Up    Patient presents with  Diabetic Retinopathy.  In both eyes.  This started 4 months ago.  Severity is moderate.  Duration of 4 months.  Since onset it is gradually worsening. Additional comments: 4 Mo F/U OU  Pt c/o worsening VA slightly OS since last visit. No other new symptoms reported OU.       Last edited by Rockie Neighbours, [place] on 11/06/2020 10:28 AM. (History)      Referring physician: [name], MD [place],  [place] [address]  HISTORICAL INFORMATION:   Selected notes from the MEDICAL RECORD NUMBER    Lab Results  Component Value Date   HGBA1C 6.1 (H) 09/18/2020     CURRENT MEDICATIONS: Current Outpatient Medications (Ophthalmic Drugs)  Medication Sig  . brimonidine (ALPHAGAN) 0.15 % ophthalmic solution Place 1 drop into both eyes in the morning and at bedtime.   . dorzolamide-timolol (COSOPT) 22.3-6.8 MG/ML ophthalmic solution Place 1 drop into both eyes 2 (two) times daily.    No current facility-administered medications for this visit. (Ophthalmic Drugs)   Current Outpatient Medications (Other)  Medication Sig  . ALPRAZolam (XANAX) 0.5 MG tablet Take 0.5 mg by mouth 2 (two) times daily as needed for anxiety.  [name] amLODipine (NORVASC) 5 MG tablet Take 5 mg by mouth daily.  [name] atorvastatin (LIPITOR) 10 MG tablet Take 1 tablet (10 mg total) by mouth at bedtime.  . folic acid (FOLVITE) 1 MG tablet Take 1 mg by mouth daily.  . Glucagon (GVOKE HYPOPEN 2-PACK) 1 MG/0.2ML SOAJ Inject 1 Syringe into the skin as needed (hypoglycemia).  . Iron-FA-B Cmp-C-Biot-Probiotic (FUSION PLUS PO) Take 1 capsule by mouth daily.  [name] LEADER UNIFINE PENTIPS PLUS 32G X 4 MM  MISC   . metoCLOPramide (REGLAN) 5 MG tablet Take 1 tablet (5 mg total) by mouth 3 (three) times daily before meals for 10 days.  . pantoprazole (PROTONIX) 40 MG tablet Take 1 tablet (40 mg total) by mouth 2 (two) times daily before a meal.  . polyethylene glycol (MIRALAX / GLYCOLAX) 17 g packet Take 17 g by mouth daily as needed for mild constipation.  . senna-docusate (SENOKOT-S) 8.6-50 MG tablet Take 1 tablet by mouth 2 (two) times daily.  . sitaGLIPtin (JANUVIA) 25 MG tablet Take 25 mg by mouth daily.   . sucralfate (CARAFATE) 1 g tablet Take 1 g by mouth 4 (four) times daily.  [name] valproic acid (DEPAKENE) 250 MG capsule Take 3 capsules (750 mg total) by mouth 3 (three) times daily.  . ziprasidone (GEODON) 20 MG capsule Take 1 tab with breakfast and 2 tabs at HS   No current facility-administered medications for this visit. (Other)      REVIEW OF SYSTEMS:    ALLERGIES Allergies  Allergen Reactions  . Donnatal [Phenobarbital-Belladonna Alk] Anaphylaxis and Other (See Comments)    Reaction to GI cocktail  . Fish Allergy Anaphylaxis, Hives and Rash  . Haldol [Haloperidol Lactate] Other (See Comments)    Chest Pain  . Lidocaine Anaphylaxis and Other (See Comments)    Reaction to GI cocktail  . Maalox [Calcium Carbonate  Antacid] Anaphylaxis and Other (See Comments)    Reaction to GI cocktail  . Shellfish Allergy Hives    Per patient: Patient stated he breaks out in hives when eating shellfish  . Aspirin Hives and Itching  . Doxycycline Hives, Nausea And Vomiting and Other (See Comments)    Severe   . Keflex [Cephalexin] Hives, Itching and Nausea And Vomiting  . Marinol [Dronabinol] Nausea And Vomiting and Other (See Comments)    Caused worsening vomiting.   . Other Swelling  . Penicillins Hives, Itching, Other (See Comments) and Rash    Has patient had a PCN reaction causing immediate rash, facial/tongue/throat swelling, SOB or lightheadedness with hypotension: yes Has patient  had a PCN reaction causing severe rash involving mucus membranes or skin necrosis: yes Has patient had a PCN reaction that required hospitalization no Has patient had a PCN reaction occurring within the last 10 years: yes If all of the above answers are "NO", then may proceed with Cephalospor  . Bactrim [Sulfamethoxazole-Trimethoprim] Itching    PAST MEDICAL HISTORY Past Medical History:  Diagnosis Date  . Anemia   . Anxiety   . CHF (congestive heart failure) (HCC)    diastolic dysfunction  . Chronic abdominal pain   . COVID-19 07/2019  . Depression   . Esophagitis   . ESRD on hemodialysis [facility])    undergoing evaluation for transplant  . Eye hemorrhage   . Gastritis   . Gastroparesis   . GERD (gastroesophageal reflux disease)   . Glaucoma   . Helicobacter pylori (H. pylori) infection 2016   ERADICATION DOCUMENTED VIA STOOL TEST in AUG 2016 at [place]  . Hiccups   . Hypertension   . Nausea and vomiting    chronic, recurrent  . Neuropathy    feet  . Renal insufficiency   . Type 2 diabetes mellitus with complications ([place])    diagnosed around age 11  . Vitreous hemorrhage of left eye ([place]) 12/15/2019   Past Surgical History:  Procedure Laterality Date  . AMPUTATION TOE Right    great toe  . AV FISTULA INSERTION W/ RF MAGNETIC GUIDANCE Left 04/06/2019   Procedure: AV FISTULA INSERTION W/RF MAGNETIC GUIDANCE;  Surgeon: Katha Cabal, MD;  Location: [place] [facility];  Service: Cardiovascular;  Laterality: Left;  . AV FISTULA PLACEMENT Left 05/06/2019   Procedure: INSERTION OF ARTERIOVENOUS (AV) GORE-TEX GRAFT ARM ( FOREARM LOOP );  Surgeon: Katha Cabal, MD;  Location: [facility] ORS;  Service: Vascular;  Laterality: Left;  . AV FISTULA PLACEMENT Left 03/21/2020   Procedure: INSERTION OF ARTERIOVENOUS (AV) GORE-TEX GRAFT ARM ( BRACHIAL AXILLARY );  Surgeon: Katha Cabal, MD;  Location: [facility] OR[facility];  Service: Vascular;  Laterality: Left;  . CATARACT EXTRACTION W/PHACO  Left 05/19/2016   Procedure: CATARACT EXTRACTION PHACO AND INTRAOCULAR LENS PLACEMENT LEFT EYE;  Surgeon: Tonny Branch, MD;  Location: [facility];  Service: Ophthalmology;  Laterality: Left;  CDE: 7.30  . CATARACT EXTRACTION W/PHACO Right 06/23/2016   Procedure: CATARACT EXTRACTION PHACO AND INTRAOCULAR LENS PLACEMENT RIGHT EYE CDE=9.87;  Surgeon: Tonny Branch, MD;  Location: [facility];  Service: Ophthalmology;  Laterality: Right;  right  . DIALYSIS/PERMA CATHETER REMOVAL N/A 07/19/2019   Procedure: DIALYSIS/PERMA CATHETER REMOVAL;  Surgeon: Katha Cabal, MD;  Location: [place] [facility];  Service: Cardiovascular;  Laterality: N/A;  . ESOPHAGOGASTRODUODENOSCOPY  2015   Dr. Britta Mccreedy  . ESOPHAGOGASTRODUODENOSCOPY N/A 10/26/2014   RMR: Distal esophagititis-likely reflux related although an element of pill  induced injuruy no exclueded  status post biopsy. Diffusely abnormal gastric mucosa of uncertain signigicane -status post gastric biopsy. Focal area of excoriation in the cardia most consistant with a trauma of heaving.   . ESOPHAGOGASTRODUODENOSCOPY  08/2015   [facility]: mild esophagitis and old gastric contents  . EYE SURGERY Left 2015   stent placed to left eye  . EYE SURGERY Right 06/2019   per patient- to remove scar tissue  . INCISION AND DRAINAGE OF WOUND Right 07/16/2017   Procedure: IRRIGATION AND DEBRIDEMENT OF SOFT TISSUE OF ULCERATION RIGHT FOOT;  Surgeon: Caprice Beaver, DPM;  Location: [facility];  Service: Podiatry;  Laterality: Right;  . IR THROMBECTOMY AV FISTULA W/THROMBOLYSIS/PTA INC/SHUNT/IMG LEFT Left 09/19/2020  . IR US GUIDE VASC ACCESS LEFT  09/19/2020  . PERIPHERAL VASCULAR THROMBECTOMY Left 09/27/2019   Procedure: PERIPHERAL VASCULAR THROMBECTOMY;  Surgeon: Katha Cabal, MD;  Location: [place] [facility];  Service: Cardiovascular;  Laterality: Left;  . PERIPHERAL VASCULAR THROMBECTOMY Left 10/27/2019   Procedure: PERIPHERAL VASCULAR THROMBECTOMY;  Surgeon: Algernon Huxley, MD;   Location: [place] [facility];  Service: Cardiovascular;  Laterality: Left;  . PERIPHERAL VASCULAR THROMBECTOMY Left 12/13/2019   Procedure: PERIPHERAL VASCULAR THROMBECTOMY;  Surgeon: Katha Cabal, MD;  Location: [place] [facility];  Service: Cardiovascular;  Laterality: Left;    FAMILY HISTORY Family History  Problem Relation Age of Onset  . Ovarian cancer Mother   . Heart attack Father   . Colon polyps Father   . Cervical cancer Sister   . Diabetes Sister   . Colon cancer Neg Hx     SOCIAL HISTORY Social History   Tobacco Use  . Smoking status: Never Smoker  . Smokeless tobacco: Never Used  Vaping Use  . Vaping Use: Never used  Substance Use Topics  . Alcohol use: No    Alcohol/week: 0.0 standard drinks  . Drug use: No         OPHTHALMIC EXAM: Base Eye Exam    Visual Acuity (ETDRS)      Right Left   Dist [place] 20/400 20/60   Dist ph [place] NI 20/50 +1       Tonometry (Tonopen, 10:29 AM)      Right Left   Pressure 15 15       Pupils      Dark Light Shape React APD   Right 2 2 Round Minimal None   Left 2 2 Irregular Minimal None       Visual Fields (Counting fingers)      Left Right   Restrictions Total superior temporal, inferior temporal, superior nasal, inferior nasal deficiencies Total superior temporal, inferior temporal, superior nasal, inferior nasal deficiencies       Extraocular Movement      Right Left    Full Full       Neuro/Psych    Oriented x3: Yes   Mood/Affect: Normal       Dilation    Both eyes: 1.0% Mydriacyl, 2.5% Phenylephrine @ 10:32 AM        Slit Lamp and Fundus Exam    External Exam      Right Left   External Normal Normal       Slit Lamp Exam      Right Left   Lids/Lashes Normal Normal   Conjunctiva/Sclera White and quiet, no exposures of external tube to footplate of glaucoma shunt White and quiet,, good coverage of tube   Cornea Clear Clear  Anterior Chamber Tube shunt at 10 o'clock Tube shunt enters at 2  o'clock position into anterior chamber   Iris Round and reactive Round and reactive   Lens Centered posterior chamber intraocular lens Posterior chamber intraocular lens   Anterior Vitreous Normal No cells       Fundus Exam      Right Left   Posterior Vitreous Pre-retinal hemorrhage, small nasal Clear   Disc 1+ Pallor 3+ Pallor,  stable   C/D Ratio 0.75 0.75   Macula No active maculopathy Attached, no active maculopathy   Vessels PDR-quiet Proliferative diabetic retinopathy, no obvious new neovascularization, PDR-quiet   Periphery Good PRP, nearly wall-to-wall laser Completely attached with good PRP moderate haze          IMAGING AND PROCEDURES  Imaging and Procedures for 11/06/20  Color Fundus Photography Optos - OU - Both Eyes       Right Eye Progression has been stable. Disc findings include increased cup to disc ratio, pallor. Macula : microaneurysms.   Left Eye Progression has been stable. Disc findings include increased cup to disc ratio, pallor. Macula : microaneurysms.   Notes Quiescent PDR overall with good clear media, stable condition                ASSESSMENT/PLAN:  Controlled diabetes mellitus with stable proliferative retinopathy of left eye, with long-term current use of insulin (HCC) The nature of regressed proliferative diabetic retinopathy was discussed with the patient. The patient was advised to maintain good glucose, blood pressure, monitor kidney function and serum lipid control as advised by personal physician. Rare risk for reactivation of progression exist with untreated severe anemia, untreated renal failure, untreated heart failure, and smoking. Complete avoidance of smoking was recommended. The chance of recurrent proliferative diabetic retinopathy was discussed as well as the chance of vitreous hemorrhage for which further treatments may be necessary.   Explained to the patient that the quiescent  proliferative diabetic retinopathy disease  is unlikely to ever worsen.  Worsening factors would include however severe anemia, hypertension out-of-control or impending renal failure.  Stable treated proliferative diabetic retinopathy of right eye determined by examination associated with type 2 diabetes mellitus ([place]) The nature of regressed proliferative diabetic retinopathy was discussed with the patient. The patient was advised to maintain good glucose, blood pressure, monitor kidney function and serum lipid control as advised by personal physician. Rare risk for reactivation of progression exist with untreated severe anemia, untreated renal failure, untreated heart failure, and smoking. Complete avoidance of smoking was recommended. The chance of recurrent proliferative diabetic retinopathy was discussed as well as the chance of vitreous hemorrhage for which further treatments may be necessary.   Explained to the patient that the quiescent  proliferative diabetic retinopathy disease is unlikely to ever worsen.  Worsening factors would include however severe anemia, hypertension out-of-control or impending renal failure.  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  The secondary glaucoma is now controlled post tube shunt  Glaucoma Follow-up and continued care under the direction of Dr. Warden Fillers       ICD-10-CM   1. Proliferative diabetic retinopathy of left eye with macular edema associated with type 2 diabetes mellitus (HCC)  A12.8786 Color Fundus Photography Optos - OU - Both Eyes  2. Optic atrophy, left eye  H47.20 Color Fundus Photography Optos - OU - Both Eyes  3. Controlled type 2 diabetes mellitus with stable  proliferative retinopathy of left eye, with long-term current use of insulin ([place])  Y85.0277    Z79.4   4. Stable treated proliferative diabetic retinopathy of right eye determined by  examination associated with type 2 diabetes mellitus ([place])  E11.3551   5. Glaucoma of both eyes, unspecified glaucoma type  H40.9     1.  PDR OU quiescent, no active disease, will continue to observe.  He is clear OU.  2.  Vision is limited by extensive optic atrophy from the combination of prior diabetic eye disease and anterior ischemic optic neuropathy secondarily as well as prior neovascular glaucoma now controlled.  3.  Follow-up with Dr. Warden Fillers as scheduled for ongoing management of glaucoma  Ophthalmic Meds Ordered this visit:  No orders of the defined types were placed in this encounter.      Return in about 6 months (around 05/09/2021) for DILATE OU, COLOR FP.  There are no Patient Instructions on file for this visit.   Explained the diagnoses, plan, and follow up with the patient and they expressed understanding.  Patient expressed understanding of the importance of proper follow up care.   Clent Demark Aryahna Spagna M.D. Diseases & Surgery of the Retina and Vitreous Retina & Diabetic [place] 11/06/20     Abbreviations: M myopia (nearsighted); A astigmatism; H hyperopia (farsighted); P presbyopia; Mrx spectacle prescription;  CTL contact lenses; OD right eye; OS left eye; OU both eyes  XT exotropia; ET esotropia; PEK punctate epithelial keratitis; PEE punctate epithelial erosions; DES dry eye syndrome; MGD meibomian gland dysfunction; ATs artificial tears; PFAT's preservative free artificial tears; [place] nuclear sclerotic cataract; PSC posterior subcapsular cataract; ERM epi-retinal membrane; PVD posterior vitreous detachment; RD retinal detachment; DM diabetes mellitus; DR diabetic retinopathy; NPDR non-proliferative diabetic retinopathy; PDR proliferative diabetic retinopathy; CSME clinically significant macular edema; DME diabetic macular edema; dbh dot blot hemorrhages; CWS cotton wool spot; POAG primary open angle glaucoma; C/D cup-to-disc ratio; HVF humphrey visual  field; GVF goldmann visual field; OCT optical coherence tomography; IOP intraocular pressure; BRVO Branch retinal vein occlusion; CRVO central retinal vein occlusion; CRAO central retinal artery occlusion; BRAO branch retinal artery occlusion; RT retinal tear; SB scleral buckle; PPV pars plana vitrectomy; VH Vitreous hemorrhage; PRP panretinal laser photocoagulation; IVK intravitreal kenalog; VMT vitreomacular traction; MH Macular hole;  NVD neovascularization of the disc; NVE neovascularization elsewhere; AREDS age related eye disease study; ARMD age related macular degeneration; POAG primary open angle glaucoma; EBMD epithelial/anterior basement membrane dystrophy; ACIOL anterior chamber intraocular lens; IOL intraocular lens; PCIOL posterior chamber intraocular lens; Phaco/IOL phacoemulsification with intraocular lens placement; [place] photorefractive keratectomy; LASIK laser assisted in situ keratomileusis; HTN hypertension; DM diabetes mellitus; COPD chronic obstructive pulmonary disease

## 2020-11-06 NOTE — Assessment & Plan Note (Signed)
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  The secondary glaucoma is now controlled post tube shunt

## 2020-11-06 NOTE — Assessment & Plan Note (Signed)
Follow-up and continued care under the direction of Dr. Warden Fillers

## 2020-11-07 ENCOUNTER — Ambulatory Visit: Payer: Medicare HMO | Admitting: Podiatry

## 2020-11-07 DIAGNOSIS — L97522 Non-pressure chronic ulcer of other part of left foot with fat layer exposed: Secondary | ICD-10-CM

## 2020-11-07 DIAGNOSIS — E119 Type 2 diabetes mellitus without complications: Secondary | ICD-10-CM | POA: Diagnosis not present

## 2020-11-07 DIAGNOSIS — I10 Essential (primary) hypertension: Secondary | ICD-10-CM | POA: Diagnosis not present

## 2020-11-07 DIAGNOSIS — Z992 Dependence on renal dialysis: Secondary | ICD-10-CM | POA: Diagnosis not present

## 2020-11-07 DIAGNOSIS — E782 Mixed hyperlipidemia: Secondary | ICD-10-CM | POA: Diagnosis not present

## 2020-11-07 DIAGNOSIS — L97529 Non-pressure chronic ulcer of other part of left foot with unspecified severity: Secondary | ICD-10-CM | POA: Diagnosis not present

## 2020-11-07 DIAGNOSIS — L97509 Non-pressure chronic ulcer of other part of unspecified foot with unspecified severity: Secondary | ICD-10-CM | POA: Diagnosis not present

## 2020-11-07 DIAGNOSIS — Z794 Long term (current) use of insulin: Secondary | ICD-10-CM | POA: Diagnosis not present

## 2020-11-07 DIAGNOSIS — E0843 Diabetes mellitus due to underlying condition with diabetic autonomic (poly)neuropathy: Secondary | ICD-10-CM

## 2020-11-07 DIAGNOSIS — I259 Chronic ischemic heart disease, unspecified: Secondary | ICD-10-CM | POA: Diagnosis not present

## 2020-11-07 DIAGNOSIS — E1140 Type 2 diabetes mellitus with diabetic neuropathy, unspecified: Secondary | ICD-10-CM | POA: Diagnosis not present

## 2020-11-07 DIAGNOSIS — E1122 Type 2 diabetes mellitus with diabetic chronic kidney disease: Secondary | ICD-10-CM | POA: Diagnosis not present

## 2020-11-07 DIAGNOSIS — Z89411 Acquired absence of right great toe: Secondary | ICD-10-CM | POA: Diagnosis not present

## 2020-11-07 DIAGNOSIS — N186 End stage renal disease: Secondary | ICD-10-CM | POA: Diagnosis not present

## 2020-11-07 NOTE — Progress Notes (Signed)
   Subjective:  50 y.o. male with PMHx of diabetes mellitus, ESRD, presenting for follow-up evaluation of an ulcer that is developed to the left hallux. Patient states that the nurse comes out weekly to change the dressings. The nurse continues to apply silver alginate to the left hallux wound weekly.  No new complaints this time  Past Medical History:  Diagnosis Date  . Anemia   . Anxiety   . CHF (congestive heart failure) (HCC)    diastolic dysfunction  . Chronic abdominal pain   . COVID-19 07/2019  . Depression   . Esophagitis   . ESRD on hemodialysis [facility])    undergoing evaluation for transplant  . Eye hemorrhage   . Gastritis   . Gastroparesis   . GERD (gastroesophageal reflux disease)   . Glaucoma   . Helicobacter pylori (H. pylori) infection 2016   ERADICATION DOCUMENTED VIA STOOL TEST in AUG 2016 at [place]  . Hiccups   . Hypertension   . Nausea and vomiting    chronic, recurrent  . Neuropathy    feet  . Renal insufficiency   . Type 2 diabetes mellitus with complications ([place])    diagnosed around age 25  . Vitreous hemorrhage of left eye ([place]) 12/15/2019    Objective/Physical Exam General: The patient is alert and oriented x3 in no acute distress.  Dermatology:  Wound #1 noted to the left hallux measuring approximately 0.5x0.5 x 0.1 cm (LxWxD).   To the noted ulceration(s), there is no eschar. There is a moderate amount of slough, fibrin, and necrotic tissue noted. Granulation tissue and wound base is red. There is a minimal amount of serosanguineous drainage noted. There is no exposed bone muscle-tendon ligament or joint. There is no malodor. Periwound integrity is intact. Skin is warm, dry and supple bilateral lower extremities.  Thickened, hyperkeratotic, dystrophic, elongated nails also noted bilateral  Vascular: Palpable pedal pulses bilaterally. No edema or erythema noted. Capillary refill within normal limits.  Neurological: Epicritic and protective  threshold diminished bilaterally.   Musculoskeletal Exam: History of partial first ray amputation right foot  Assessment: 1.  Ulcer left hallux  secondary to diabetes mellitus 2. diabetes mellitus w/ peripheral neuropathy 3. H/o partial first ray amputation right foot   Plan of Care:  1. Patient was evaluated.  2. medically necessary excisional debridement including subcutaneous tissue was performed using a tissue nipper and a chisel blade. Excisional debridement of all the necrotic nonviable tissue down to healthy bleeding viable tissue was performed with post-debridement measurements same as pre-. 3. the wound was cleansed and dry sterile dressing applied. 4.  Continue home health dressing changes weekly  5.  Diabetic shoes and insoles were dispensed today. 6.  Return to clinic in 4 weeks  Edrick Kins, DPM [facility]  Dr. Edrick Kins, DPM    [address], [place] [address]                Office [phone]  Fax [phone]

## 2020-11-08 DIAGNOSIS — E1122 Type 2 diabetes mellitus with diabetic chronic kidney disease: Secondary | ICD-10-CM | POA: Diagnosis not present

## 2020-11-08 DIAGNOSIS — F209 Schizophrenia, unspecified: Secondary | ICD-10-CM | POA: Diagnosis not present

## 2020-11-08 DIAGNOSIS — F319 Bipolar disorder, unspecified: Secondary | ICD-10-CM | POA: Diagnosis not present

## 2020-11-08 DIAGNOSIS — R011 Cardiac murmur, unspecified: Secondary | ICD-10-CM | POA: Diagnosis not present

## 2020-11-08 DIAGNOSIS — I5032 Chronic diastolic (congestive) heart failure: Secondary | ICD-10-CM | POA: Diagnosis not present

## 2020-11-08 DIAGNOSIS — R569 Unspecified convulsions: Secondary | ICD-10-CM | POA: Diagnosis not present

## 2020-11-08 DIAGNOSIS — N186 End stage renal disease: Secondary | ICD-10-CM | POA: Diagnosis not present

## 2020-11-08 DIAGNOSIS — D631 Anemia in chronic kidney disease: Secondary | ICD-10-CM | POA: Diagnosis not present

## 2020-11-08 DIAGNOSIS — E1143 Type 2 diabetes mellitus with diabetic autonomic (poly)neuropathy: Secondary | ICD-10-CM | POA: Diagnosis not present

## 2020-11-08 DIAGNOSIS — Z992 Dependence on renal dialysis: Secondary | ICD-10-CM | POA: Diagnosis not present

## 2020-11-08 DIAGNOSIS — I132 Hypertensive heart and chronic kidney disease with heart failure and with stage 5 chronic kidney disease, or end stage renal disease: Secondary | ICD-10-CM | POA: Diagnosis not present

## 2020-11-08 DIAGNOSIS — R531 Weakness: Secondary | ICD-10-CM | POA: Diagnosis not present

## 2020-11-09 DIAGNOSIS — Z992 Dependence on renal dialysis: Secondary | ICD-10-CM | POA: Diagnosis not present

## 2020-11-09 DIAGNOSIS — N186 End stage renal disease: Secondary | ICD-10-CM | POA: Diagnosis not present

## 2020-11-12 DIAGNOSIS — Z992 Dependence on renal dialysis: Secondary | ICD-10-CM | POA: Diagnosis not present

## 2020-11-12 DIAGNOSIS — N186 End stage renal disease: Secondary | ICD-10-CM | POA: Diagnosis not present

## 2020-11-13 DIAGNOSIS — M86179 Other acute osteomyelitis, unspecified ankle and foot: Secondary | ICD-10-CM | POA: Diagnosis not present

## 2020-11-13 DIAGNOSIS — N183 Chronic kidney disease, stage 3 unspecified: Secondary | ICD-10-CM | POA: Diagnosis not present

## 2020-11-13 DIAGNOSIS — I132 Hypertensive heart and chronic kidney disease with heart failure and with stage 5 chronic kidney disease, or end stage renal disease: Secondary | ICD-10-CM | POA: Diagnosis not present

## 2020-11-13 DIAGNOSIS — I5032 Chronic diastolic (congestive) heart failure: Secondary | ICD-10-CM | POA: Diagnosis not present

## 2020-11-13 DIAGNOSIS — E118 Type 2 diabetes mellitus with unspecified complications: Secondary | ICD-10-CM | POA: Diagnosis not present

## 2020-11-13 DIAGNOSIS — E1122 Type 2 diabetes mellitus with diabetic chronic kidney disease: Secondary | ICD-10-CM | POA: Diagnosis not present

## 2020-11-13 DIAGNOSIS — E1143 Type 2 diabetes mellitus with diabetic autonomic (poly)neuropathy: Secondary | ICD-10-CM | POA: Diagnosis not present

## 2020-11-13 DIAGNOSIS — N186 End stage renal disease: Secondary | ICD-10-CM | POA: Diagnosis not present

## 2020-11-13 DIAGNOSIS — D631 Anemia in chronic kidney disease: Secondary | ICD-10-CM | POA: Diagnosis not present

## 2020-11-14 DIAGNOSIS — Z992 Dependence on renal dialysis: Secondary | ICD-10-CM | POA: Diagnosis not present

## 2020-11-14 DIAGNOSIS — N186 End stage renal disease: Secondary | ICD-10-CM | POA: Diagnosis not present

## 2020-11-16 DIAGNOSIS — D631 Anemia in chronic kidney disease: Secondary | ICD-10-CM | POA: Diagnosis not present

## 2020-11-16 DIAGNOSIS — E1122 Type 2 diabetes mellitus with diabetic chronic kidney disease: Secondary | ICD-10-CM | POA: Diagnosis not present

## 2020-11-16 DIAGNOSIS — N186 End stage renal disease: Secondary | ICD-10-CM | POA: Diagnosis not present

## 2020-11-16 DIAGNOSIS — Z992 Dependence on renal dialysis: Secondary | ICD-10-CM | POA: Diagnosis not present

## 2020-11-16 DIAGNOSIS — I5032 Chronic diastolic (congestive) heart failure: Secondary | ICD-10-CM | POA: Diagnosis not present

## 2020-11-16 DIAGNOSIS — E1143 Type 2 diabetes mellitus with diabetic autonomic (poly)neuropathy: Secondary | ICD-10-CM | POA: Diagnosis not present

## 2020-11-16 DIAGNOSIS — I132 Hypertensive heart and chronic kidney disease with heart failure and with stage 5 chronic kidney disease, or end stage renal disease: Secondary | ICD-10-CM | POA: Diagnosis not present

## 2020-11-19 DIAGNOSIS — N186 End stage renal disease: Secondary | ICD-10-CM | POA: Diagnosis not present

## 2020-11-19 DIAGNOSIS — Z992 Dependence on renal dialysis: Secondary | ICD-10-CM | POA: Diagnosis not present

## 2020-11-20 DIAGNOSIS — Z992 Dependence on renal dialysis: Secondary | ICD-10-CM | POA: Diagnosis not present

## 2020-11-20 DIAGNOSIS — I132 Hypertensive heart and chronic kidney disease with heart failure and with stage 5 chronic kidney disease, or end stage renal disease: Secondary | ICD-10-CM | POA: Diagnosis not present

## 2020-11-20 DIAGNOSIS — T82898A Other specified complication of vascular prosthetic devices, implants and grafts, initial encounter: Secondary | ICD-10-CM | POA: Diagnosis not present

## 2020-11-20 DIAGNOSIS — E1122 Type 2 diabetes mellitus with diabetic chronic kidney disease: Secondary | ICD-10-CM | POA: Diagnosis not present

## 2020-11-20 DIAGNOSIS — I5032 Chronic diastolic (congestive) heart failure: Secondary | ICD-10-CM | POA: Diagnosis not present

## 2020-11-20 DIAGNOSIS — E1143 Type 2 diabetes mellitus with diabetic autonomic (poly)neuropathy: Secondary | ICD-10-CM | POA: Diagnosis not present

## 2020-11-20 DIAGNOSIS — N186 End stage renal disease: Secondary | ICD-10-CM | POA: Diagnosis not present

## 2020-11-20 DIAGNOSIS — D631 Anemia in chronic kidney disease: Secondary | ICD-10-CM | POA: Diagnosis not present

## 2020-11-21 DIAGNOSIS — N186 End stage renal disease: Secondary | ICD-10-CM | POA: Diagnosis not present

## 2020-11-21 DIAGNOSIS — Z992 Dependence on renal dialysis: Secondary | ICD-10-CM | POA: Diagnosis not present

## 2020-11-22 DIAGNOSIS — E1143 Type 2 diabetes mellitus with diabetic autonomic (poly)neuropathy: Secondary | ICD-10-CM | POA: Diagnosis not present

## 2020-11-22 DIAGNOSIS — D631 Anemia in chronic kidney disease: Secondary | ICD-10-CM | POA: Diagnosis not present

## 2020-11-22 DIAGNOSIS — E1122 Type 2 diabetes mellitus with diabetic chronic kidney disease: Secondary | ICD-10-CM | POA: Diagnosis not present

## 2020-11-22 DIAGNOSIS — N186 End stage renal disease: Secondary | ICD-10-CM | POA: Diagnosis not present

## 2020-11-22 DIAGNOSIS — I5032 Chronic diastolic (congestive) heart failure: Secondary | ICD-10-CM | POA: Diagnosis not present

## 2020-11-22 DIAGNOSIS — I132 Hypertensive heart and chronic kidney disease with heart failure and with stage 5 chronic kidney disease, or end stage renal disease: Secondary | ICD-10-CM | POA: Diagnosis not present

## 2020-11-23 DIAGNOSIS — N186 End stage renal disease: Secondary | ICD-10-CM | POA: Diagnosis not present

## 2020-11-23 DIAGNOSIS — Z992 Dependence on renal dialysis: Secondary | ICD-10-CM | POA: Diagnosis not present

## 2020-11-26 DIAGNOSIS — Z992 Dependence on renal dialysis: Secondary | ICD-10-CM | POA: Diagnosis not present

## 2020-11-26 DIAGNOSIS — N186 End stage renal disease: Secondary | ICD-10-CM | POA: Diagnosis not present

## 2020-11-26 NOTE — Progress Notes (Signed)
Cardiology Office Note  Date: 11/27/2020   ID: Timothy Clark, DOB 12-19-1970, MRN [id number]  PCP:  [name], MD  Cardiologist:  No primary care provider on file. Electrophysiologist:  None   Chief Complaint: Cardiac murmur referred by PCP  History of Present Illness: Timothy Clark is a 50 y.o. male with a history of chronic diastolic heart failure, HTN, DM2, ESRD on hemodialysis,  Recently seen by  primary care provider on 11/08/2020.  He was referred to [place] due to a cardiac murmur described as systolic murmur noted on exam.  Patient stated he had never been told he had a heart murmur.  According to PCP notes he had an echocardiogram in 2018 that showed grade 2 systolic dysfunction and EF of 60 to 65%.  It sounds as though he had grade 2 diastolic dysfunction rather.  He recently had an echocardiogram 06/07/2020 at [facility] in anticipation of possibly getting on renal transplant list which showed EF of 60 to 65% LA mildly dilated, RA mildly dilated no significant valvular stenosis or regurgitation.  No pericardial effusion.  It did state there was trace mitral regurgitation and trace aortic regurgitation.  He also had a stress echocardiogram with dobutamine with no chest pain during stress.  Normal LV function and global wall motion with stress.  Negative dobutamine echocardiography for inducible ischemia at target heart rate.  Negative stress EKG for inducible ischemia at target rate.  Today he denies any anginal or exertional symptoms, palpitations or arrhythmias, orthostatic symptoms, CVA or TIA-like symptoms, bleeding, PND, orthopnea, claudication-like symptoms, DVT or PE like symptoms, or lower extremity edema.  He is currently undergoing dialysis on Monday Wednesdays and Fridays.  Has a AV fistula in left upper extremity with positive thrill and bruit.  Blood pressure is well controlled on current medical therapy.  Past Medical History:  Diagnosis Date   . Anemia   . Anxiety   . CHF (congestive heart failure) (HCC)    diastolic dysfunction  . Chronic abdominal pain   . COVID-19 07/2019  . Depression   . Esophagitis   . ESRD on hemodialysis [facility])    undergoing evaluation for transplant  . Eye hemorrhage   . Gastritis   . Gastroparesis   . GERD (gastroesophageal reflux disease)   . Glaucoma   . Helicobacter pylori (H. pylori) infection 2016   ERADICATION DOCUMENTED VIA STOOL TEST in AUG 2016 at [place]  . Hiccups   . Hypertension   . Nausea and vomiting    chronic, recurrent  . Neuropathy    feet  . Renal insufficiency   . Type 2 diabetes mellitus with complications ([place])    diagnosed around age 3  . Vitreous hemorrhage of left eye ([place]) 12/15/2019    Past Surgical History:  Procedure Laterality Date  . AMPUTATION TOE Right    great toe  . AV FISTULA INSERTION W/ RF MAGNETIC GUIDANCE Left 04/06/2019   Procedure: AV FISTULA INSERTION W/RF MAGNETIC GUIDANCE;  Surgeon: Katha Cabal, MD;  Location: [place] [facility];  Service: Cardiovascular;  Laterality: Left;  . AV FISTULA PLACEMENT Left 05/06/2019   Procedure: INSERTION OF ARTERIOVENOUS (AV) GORE-TEX GRAFT ARM ( FOREARM LOOP );  Surgeon: Katha Cabal, MD;  Location: [facility];  Service: Vascular;  Laterality: Left;  . AV FISTULA PLACEMENT Left 03/21/2020   Procedure: INSERTION OF ARTERIOVENOUS (AV) GORE-TEX GRAFT ARM ( BRACHIAL AXILLARY );  Surgeon: Katha Cabal, MD;  Location: [facility] OR[facility];  Service: Vascular;  Laterality: Left;  . CATARACT EXTRACTION W/PHACO Left 05/19/2016   Procedure: CATARACT EXTRACTION PHACO AND INTRAOCULAR LENS PLACEMENT LEFT EYE;  Surgeon: Tonny Branch, MD;  Location: [facility];  Service: Ophthalmology;  Laterality: Left;  CDE: 7.30  . CATARACT EXTRACTION W/PHACO Right 06/23/2016   Procedure: CATARACT EXTRACTION PHACO AND INTRAOCULAR LENS PLACEMENT RIGHT EYE CDE=9.87;  Surgeon: Tonny Branch, MD;  Location: [facility];  Service: Ophthalmology;   Laterality: Right;  right  . DIALYSIS/PERMA CATHETER REMOVAL N/A 07/19/2019   Procedure: DIALYSIS/PERMA CATHETER REMOVAL;  Surgeon: Katha Cabal, MD;  Location: [place] [facility];  Service: Cardiovascular;  Laterality: N/A;  . ESOPHAGOGASTRODUODENOSCOPY  2015   Dr. Britta Mccreedy  . ESOPHAGOGASTRODUODENOSCOPY N/A 10/26/2014   RMR: Distal esophagititis-likely reflux related although an element of pill induced injuruy no exclueded  status post biopsy. Diffusely abnormal gastric mucosa of uncertain signigicane -status post gastric biopsy. Focal area of excoriation in the cardia most consistant with a trauma of heaving.   . ESOPHAGOGASTRODUODENOSCOPY  08/2015   [facility]: mild esophagitis and old gastric contents  . EYE SURGERY Left 2015   stent placed to left eye  . EYE SURGERY Right 06/2019   per patient- to remove scar tissue  . INCISION AND DRAINAGE OF WOUND Right 07/16/2017   Procedure: IRRIGATION AND DEBRIDEMENT OF SOFT TISSUE OF ULCERATION RIGHT FOOT;  Surgeon: Caprice Beaver, DPM;  Location: [facility];  Service: Podiatry;  Laterality: Right;  . IR THROMBECTOMY AV FISTULA W/THROMBOLYSIS/PTA INC/SHUNT/IMG LEFT Left 09/19/2020  . IR US GUIDE VASC ACCESS LEFT  09/19/2020  . PERIPHERAL VASCULAR THROMBECTOMY Left 09/27/2019   Procedure: PERIPHERAL VASCULAR THROMBECTOMY;  Surgeon: Katha Cabal, MD;  Location: [facility];  Service: Cardiovascular;  Laterality: Left;  . PERIPHERAL VASCULAR THROMBECTOMY Left 10/27/2019   Procedure: PERIPHERAL VASCULAR THROMBECTOMY;  Surgeon: Algernon Huxley, MD;  Location: [place] [facility];  Service: Cardiovascular;  Laterality: Left;  . PERIPHERAL VASCULAR THROMBECTOMY Left 12/13/2019   Procedure: PERIPHERAL VASCULAR THROMBECTOMY;  Surgeon: Katha Cabal, MD;  Location: [place] [facility];  Service: Cardiovascular;  Laterality: Left;    Current Outpatient Medications  Medication Sig Dispense Refill  . ALPRAZolam (XANAX) 0.5 MG tablet Take 0.5 mg by  mouth 2 (two) times daily as needed for anxiety.    [name] amLODipine (NORVASC) 5 MG tablet Take 5 mg by mouth daily.    [name] atorvastatin (LIPITOR) 10 MG tablet Take 1 tablet (10 mg total) by mouth at bedtime. 30 tablet 0  . brimonidine (ALPHAGAN) 0.15 % ophthalmic solution Place 1 drop into both eyes in the morning and at bedtime.     . dorzolamide-timolol (COSOPT) 22.3-6.8 MG/ML ophthalmic solution Place 1 drop into both eyes 2 (two) times daily.     . folic acid (FOLVITE) 1 MG tablet Take 1 mg by mouth daily.    . Glucagon (GVOKE HYPOPEN 2-PACK) 1 MG/0.2ML SOAJ Inject 1 Syringe into the skin as needed (hypoglycemia).    . Iron-FA-B Cmp-C-Biot-Probiotic (FUSION PLUS PO) Take 1 capsule by mouth daily.    [name] LEADER UNIFINE PENTIPS PLUS 32G X 4 MM MISC     . metoCLOPramide (REGLAN) 5 MG tablet Take 1 tablet (5 mg total) by mouth 3 (three) times daily before meals for 10 days. 30 tablet 0  . pantoprazole (PROTONIX) 40 MG tablet Take 1 tablet (40 mg total) by mouth 2 (two) times daily before a meal. 180 tablet 3  . polyethylene  glycol (MIRALAX / GLYCOLAX) 17 g packet Take 17 g by mouth daily as needed for mild constipation. 14 each 0  . senna-docusate (SENOKOT-S) 8.6-50 MG tablet Take 1 tablet by mouth 2 (two) times daily. 60 tablet 1  . sitaGLIPtin (JANUVIA) 25 MG tablet Take 25 mg by mouth daily.     . sucralfate (CARAFATE) 1 g tablet Take 1 g by mouth 4 (four) times daily.    [name] valproic acid (DEPAKENE) 250 MG capsule Take 3 capsules (750 mg total) by mouth 3 (three) times daily. 270 capsule 1  . ziprasidone (GEODON) 20 MG capsule Take 1 tab with breakfast and 2 tabs at HS 90 capsule 1   No current facility-administered medications for this visit.   Allergies:  Donnatal [phenobarbital-belladonna alk], Fish allergy, Haldol [haloperidol lactate], Lidocaine, Maalox [calcium carbonate antacid], Shellfish allergy, Aspirin, Doxycycline, Keflex [cephalexin], Marinol [dronabinol], Other, Penicillins, and Bactrim  [sulfamethoxazole-trimethoprim]   Social History: The patient  reports that he has never smoked. He has never used smokeless tobacco. He reports that he does not drink alcohol and does not use drugs.   Family History: The patient's family history includes Cervical cancer in his sister; Colon polyps in his father; Diabetes in his sister; Heart attack in his father; Ovarian cancer in his mother.   ROS:  Please see the history of present illness. Otherwise, complete review of systems is positive for none.  All other systems are reviewed and negative.   Physical Exam: VS:  BP 120/70   Pulse 80   Ht '5\' 11"'  (1.803 m)   Wt 265 lb 9.6 oz (120.5 kg)   SpO2 99%   BMI 37.04 kg/m , BMI Body mass index is 37.04 kg/m.  Wt Readings from Last 3 Encounters:  11/27/20 265 lb 9.6 oz (120.5 kg)  10/12/20 236 lb 15.9 oz (107.5 kg)  07/26/20 276 lb 10.8 oz (125.5 kg)    General: Patient appears comfortable at rest. Neck: Supple, no elevated JVP or carotid bruits, no thyromegaly. Lungs: Clear to auscultation, nonlabored breathing at rest. Cardiac: Regular rate and rhythm, no S3 or significant systolic murmur, no pericardial rub. Extremities: No pitting edema, distal pulses 2+.  AV fistula left upper extremity with positive thrill and bruit. Skin: Warm and dry. Musculoskeletal: No kyphosis. Neuropsychiatric: Alert and oriented x3, affect grossly appropriate.  ECG:    Recent Labwork: 03/02/2020: TSH 0.873 09/17/2020: ALT 16; AST 15 10/06/2020: Magnesium 2.3 10/12/2020: BUN 93; Creatinine, Ser 6.58; Hemoglobin 10.5; Platelets 123; Potassium 3.1; Sodium 133     Component Value Date/Time   CHOL 155 06/14/2018 0211   TRIG 141 06/14/2018 0211   HDL 30 (L) 06/14/2018 0211   CHOLHDL 5.2 06/14/2018 0211   VLDL 28 06/14/2018 0211   LDLCALC 97 06/14/2018 0211   LDLDIRECT 142.1 10/19/2009 1629    Other Studies Reviewed Today:  Stress Echo [facility] 11/15/2020 SUMMARY  The patient had no chest pain during  stress  The patient achieved 92 % of maximum predicted heart rate.  Normal left ventricular function and global wall motion with stress.  Negative dobutamine echocardiography for inducible ischemia at target  heart rate.  Negative stress ECG for inducible ischemia at target heart rate.    Echocardiogram [facility] 06/07/2020 PROCEDURE A two-dimensional transthoracic echocardiogram with color flow and Doppler was performed. - SUMMARY The left ventricular size is normal. Mild left ventricular hypertrophy LV ejection fraction = 60-65%. Left ventricular systolic function is normal. The right ventricle is normal  in size and function. The left atrium is mildly dilated. The right atrium is mildly dilated. There was insufficient TR detected to calculate RV systolic pressure. Estimated right atrial pressure is 5 mmHg.[name] There is no significant valvular stenosis or regurgitation. There is no pericardial effusion. There is no comparison study available.  - FINDINGS: LEFT VENTRICLE The left ventricular size is normal. Mild left ventricular hypertrophy. LV ejection fraction = 60-65%. Left ventricular systolic function is normal. Left ventricular filling pattern is indeterminate. The left ventricular wall motion is normal.  - RIGHT VENTRICLE The right ventricle is normal in size and function.  LEFT ATRIUM The left atrium is mildly dilated.  RIGHT ATRIUM The right atrium is mildly dilated. - AORTIC VALVE The aortic valve is trileaflet. Focal calcification of the aortic valve. There is no aortic stenosis. There is trace aortic regurgitation. - MITRAL VALVE The mitral valve is normal in structure and function. There is trace mitral regurgitation. - TRICUSPID VALVE The tricuspid valve is normal in structure and function. There is trace tricuspid regurgitation. There was insufficient TR detected to calculate RV systolic pressure. Estimated right atrial  pressure is 5 mmHg.. - PULMONIC VALVE The pulmonic valve is not well visualized. Trace pulmonic valvular regurgitation. - ARTERIES The aortic sinus is normal size. - VENOUS Pulmonary venous flow pattern is normal. The IVC is normal in size with an inspiratory collapse of greater then 50%, suggesting normal right atrial pressure. - EFFUSION There is no pericardial effusion  Assessment and Plan:  1. Murmur, cardiac   2. Mitral valve insufficiency, unspecified etiology    1. Murmur, cardiac Referred by PCP for cardiac murmur.  Had a recent echocardiogram as noted above at [facility] in anticipation of being placed on renal transplant list due to ESRD.  No significant systolic murmur heard on exam.  2. Mitral valve insufficiency, unspecified etiology Recent echo did show a trace of MR and trace AR.  We will obtain a follow-up echocardiogram in 1 year if not obtained at [facility] prior to follow-up.  3.  Hypertension BP is well controlled on current therapy.  Continue amlodipine 5 mg daily.  4.  Hyperlipidemia Continue atorvastatin 10 mg daily. Medication Adjustments/Labs and Tests Ordered: Current medicines are reviewed at length with the patient today.  Concerns regarding medicines are outlined above.   Disposition: Follow-up with Dr. Harl Bowie or APP 1 year.  Signed, Levell July, NP 11/27/2020 11:42 AM    [facility] [address], [address], [place] [address] Phone: [phone]; Fax: [phone]

## 2020-11-27 ENCOUNTER — Encounter: Payer: Self-pay | Admitting: Family Medicine

## 2020-11-27 ENCOUNTER — Ambulatory Visit (INDEPENDENT_AMBULATORY_CARE_PROVIDER_SITE_OTHER): Payer: Medicare HMO | Admitting: Family Medicine

## 2020-11-27 VITALS — BP 120/70 | HR 80 | Ht 71.0 in | Wt 265.6 lb

## 2020-11-27 DIAGNOSIS — E1143 Type 2 diabetes mellitus with diabetic autonomic (poly)neuropathy: Secondary | ICD-10-CM | POA: Diagnosis not present

## 2020-11-27 DIAGNOSIS — I340 Nonrheumatic mitral (valve) insufficiency: Secondary | ICD-10-CM | POA: Diagnosis not present

## 2020-11-27 DIAGNOSIS — R011 Cardiac murmur, unspecified: Secondary | ICD-10-CM | POA: Diagnosis not present

## 2020-11-27 DIAGNOSIS — E1122 Type 2 diabetes mellitus with diabetic chronic kidney disease: Secondary | ICD-10-CM | POA: Diagnosis not present

## 2020-11-27 DIAGNOSIS — I5032 Chronic diastolic (congestive) heart failure: Secondary | ICD-10-CM | POA: Diagnosis not present

## 2020-11-27 DIAGNOSIS — N186 End stage renal disease: Secondary | ICD-10-CM | POA: Diagnosis not present

## 2020-11-27 DIAGNOSIS — D631 Anemia in chronic kidney disease: Secondary | ICD-10-CM | POA: Diagnosis not present

## 2020-11-27 DIAGNOSIS — I132 Hypertensive heart and chronic kidney disease with heart failure and with stage 5 chronic kidney disease, or end stage renal disease: Secondary | ICD-10-CM | POA: Diagnosis not present

## 2020-11-27 NOTE — Patient Instructions (Signed)
Medication Instructions:  Continue all current medications.  Labwork: none  Testing/Procedures: Your physician has requested that you have an echocardiogram. Echocardiography is a painless test that uses sound waves to create images of your heart. It provides your doctor with information about the size and shape of your heart and how well your heart's chambers and valves are working. This procedure takes approximately one hour. There are no restrictions for this procedure - DUE IN 1 YEAR JUST PRIOR TO NEXT VISIT   Follow-Up: Your physician wants you to follow up in:  1 year.  You will receive a reminder letter in the mail one-two months in advance.  If you don't receive a letter, please call our office to schedule the follow up appointment.    Any Other Special Instructions Will Be Listed Below (If Applicable).  If you need a refill on your cardiac medications before your next appointment, please call your pharmacy.

## 2020-11-28 DIAGNOSIS — Z992 Dependence on renal dialysis: Secondary | ICD-10-CM | POA: Diagnosis not present

## 2020-11-28 DIAGNOSIS — N186 End stage renal disease: Secondary | ICD-10-CM | POA: Diagnosis not present

## 2020-11-30 DIAGNOSIS — Z992 Dependence on renal dialysis: Secondary | ICD-10-CM | POA: Diagnosis not present

## 2020-11-30 DIAGNOSIS — N186 End stage renal disease: Secondary | ICD-10-CM | POA: Diagnosis not present

## 2020-12-03 DIAGNOSIS — N186 End stage renal disease: Secondary | ICD-10-CM | POA: Diagnosis not present

## 2020-12-03 DIAGNOSIS — Z992 Dependence on renal dialysis: Secondary | ICD-10-CM | POA: Diagnosis not present

## 2020-12-04 ENCOUNTER — Telehealth: Payer: Self-pay | Admitting: *Deleted

## 2020-12-04 DIAGNOSIS — I132 Hypertensive heart and chronic kidney disease with heart failure and with stage 5 chronic kidney disease, or end stage renal disease: Secondary | ICD-10-CM | POA: Diagnosis not present

## 2020-12-04 DIAGNOSIS — E1122 Type 2 diabetes mellitus with diabetic chronic kidney disease: Secondary | ICD-10-CM | POA: Diagnosis not present

## 2020-12-04 DIAGNOSIS — D631 Anemia in chronic kidney disease: Secondary | ICD-10-CM | POA: Diagnosis not present

## 2020-12-04 DIAGNOSIS — N186 End stage renal disease: Secondary | ICD-10-CM | POA: Diagnosis not present

## 2020-12-04 DIAGNOSIS — I5032 Chronic diastolic (congestive) heart failure: Secondary | ICD-10-CM | POA: Diagnosis not present

## 2020-12-04 DIAGNOSIS — E1143 Type 2 diabetes mellitus with diabetic autonomic (poly)neuropathy: Secondary | ICD-10-CM | POA: Diagnosis not present

## 2020-12-04 NOTE — Telephone Encounter (Signed)
Jonelle Sports is calling with [facility] and are about to discharge patient from their services but will still need new wound care orders. Please advise.

## 2020-12-05 DIAGNOSIS — Z992 Dependence on renal dialysis: Secondary | ICD-10-CM | POA: Diagnosis not present

## 2020-12-05 DIAGNOSIS — N186 End stage renal disease: Secondary | ICD-10-CM | POA: Diagnosis not present

## 2020-12-07 DIAGNOSIS — Z992 Dependence on renal dialysis: Secondary | ICD-10-CM | POA: Diagnosis not present

## 2020-12-07 DIAGNOSIS — N186 End stage renal disease: Secondary | ICD-10-CM | POA: Diagnosis not present

## 2020-12-08 DIAGNOSIS — N186 End stage renal disease: Secondary | ICD-10-CM | POA: Diagnosis not present

## 2020-12-08 DIAGNOSIS — Z992 Dependence on renal dialysis: Secondary | ICD-10-CM | POA: Diagnosis not present

## 2020-12-09 DIAGNOSIS — K219 Gastro-esophageal reflux disease without esophagitis: Secondary | ICD-10-CM | POA: Diagnosis not present

## 2020-12-09 DIAGNOSIS — E1165 Type 2 diabetes mellitus with hyperglycemia: Secondary | ICD-10-CM | POA: Diagnosis not present

## 2020-12-10 DIAGNOSIS — N186 End stage renal disease: Secondary | ICD-10-CM | POA: Diagnosis not present

## 2020-12-10 DIAGNOSIS — Z992 Dependence on renal dialysis: Secondary | ICD-10-CM | POA: Diagnosis not present

## 2020-12-12 ENCOUNTER — Ambulatory Visit (INDEPENDENT_AMBULATORY_CARE_PROVIDER_SITE_OTHER): Payer: Medicare HMO | Admitting: Podiatry

## 2020-12-12 ENCOUNTER — Other Ambulatory Visit: Payer: Self-pay

## 2020-12-12 VITALS — Temp 99.3°F

## 2020-12-12 DIAGNOSIS — E0843 Diabetes mellitus due to underlying condition with diabetic autonomic (poly)neuropathy: Secondary | ICD-10-CM

## 2020-12-12 DIAGNOSIS — N186 End stage renal disease: Secondary | ICD-10-CM | POA: Diagnosis not present

## 2020-12-12 DIAGNOSIS — L97522 Non-pressure chronic ulcer of other part of left foot with fat layer exposed: Secondary | ICD-10-CM | POA: Diagnosis not present

## 2020-12-12 DIAGNOSIS — Z992 Dependence on renal dialysis: Secondary | ICD-10-CM | POA: Diagnosis not present

## 2020-12-12 MED ORDER — GENTAMICIN SULFATE 0.1 % EX CREA: 1.0000 "application " | TOPICAL_CREAM | Freq: Two times a day (BID) | CUTANEOUS | 1 refills | Status: AC

## 2020-12-13 DIAGNOSIS — E1122 Type 2 diabetes mellitus with diabetic chronic kidney disease: Secondary | ICD-10-CM | POA: Diagnosis not present

## 2020-12-13 DIAGNOSIS — N186 End stage renal disease: Secondary | ICD-10-CM | POA: Diagnosis not present

## 2020-12-13 DIAGNOSIS — N183 Chronic kidney disease, stage 3 unspecified: Secondary | ICD-10-CM | POA: Diagnosis not present

## 2020-12-13 DIAGNOSIS — E118 Type 2 diabetes mellitus with unspecified complications: Secondary | ICD-10-CM | POA: Diagnosis not present

## 2020-12-13 DIAGNOSIS — E1143 Type 2 diabetes mellitus with diabetic autonomic (poly)neuropathy: Secondary | ICD-10-CM | POA: Diagnosis not present

## 2020-12-13 DIAGNOSIS — M86179 Other acute osteomyelitis, unspecified ankle and foot: Secondary | ICD-10-CM | POA: Diagnosis not present

## 2020-12-13 DIAGNOSIS — D631 Anemia in chronic kidney disease: Secondary | ICD-10-CM | POA: Diagnosis not present

## 2020-12-13 DIAGNOSIS — I5032 Chronic diastolic (congestive) heart failure: Secondary | ICD-10-CM | POA: Diagnosis not present

## 2020-12-13 DIAGNOSIS — I132 Hypertensive heart and chronic kidney disease with heart failure and with stage 5 chronic kidney disease, or end stage renal disease: Secondary | ICD-10-CM | POA: Diagnosis not present

## 2020-12-14 DIAGNOSIS — N186 End stage renal disease: Secondary | ICD-10-CM | POA: Diagnosis not present

## 2020-12-14 DIAGNOSIS — Z992 Dependence on renal dialysis: Secondary | ICD-10-CM | POA: Diagnosis not present

## 2020-12-17 DIAGNOSIS — N186 End stage renal disease: Secondary | ICD-10-CM | POA: Diagnosis not present

## 2020-12-17 DIAGNOSIS — Z992 Dependence on renal dialysis: Secondary | ICD-10-CM | POA: Diagnosis not present

## 2020-12-19 DIAGNOSIS — N186 End stage renal disease: Secondary | ICD-10-CM | POA: Diagnosis not present

## 2020-12-19 DIAGNOSIS — Z992 Dependence on renal dialysis: Secondary | ICD-10-CM | POA: Diagnosis not present

## 2020-12-20 DIAGNOSIS — I5032 Chronic diastolic (congestive) heart failure: Secondary | ICD-10-CM | POA: Diagnosis not present

## 2020-12-20 DIAGNOSIS — E1122 Type 2 diabetes mellitus with diabetic chronic kidney disease: Secondary | ICD-10-CM | POA: Diagnosis not present

## 2020-12-20 DIAGNOSIS — I132 Hypertensive heart and chronic kidney disease with heart failure and with stage 5 chronic kidney disease, or end stage renal disease: Secondary | ICD-10-CM | POA: Diagnosis not present

## 2020-12-20 DIAGNOSIS — D631 Anemia in chronic kidney disease: Secondary | ICD-10-CM | POA: Diagnosis not present

## 2020-12-20 DIAGNOSIS — E1143 Type 2 diabetes mellitus with diabetic autonomic (poly)neuropathy: Secondary | ICD-10-CM | POA: Diagnosis not present

## 2020-12-20 DIAGNOSIS — N186 End stage renal disease: Secondary | ICD-10-CM | POA: Diagnosis not present

## 2020-12-21 DIAGNOSIS — N186 End stage renal disease: Secondary | ICD-10-CM | POA: Diagnosis not present

## 2020-12-21 DIAGNOSIS — Z992 Dependence on renal dialysis: Secondary | ICD-10-CM | POA: Diagnosis not present

## 2020-12-24 ENCOUNTER — Emergency Department (HOSPITAL_COMMUNITY): Payer: Medicare HMO

## 2020-12-24 ENCOUNTER — Other Ambulatory Visit: Payer: Self-pay

## 2020-12-24 ENCOUNTER — Encounter (HOSPITAL_COMMUNITY): Payer: Self-pay

## 2020-12-24 ENCOUNTER — Emergency Department (HOSPITAL_COMMUNITY)
Admission: EM | Admit: 2020-12-24 | Discharge: 2020-12-24 | Disposition: A | Discharge status: Home / Self Care | Payer: Medicare HMO | Attending: Emergency Medicine | Admitting: Emergency Medicine

## 2020-12-24 DIAGNOSIS — I120 Hypertensive chronic kidney disease with stage 5 chronic kidney disease or end stage renal disease: Secondary | ICD-10-CM | POA: Diagnosis not present

## 2020-12-24 DIAGNOSIS — I132 Hypertensive heart and chronic kidney disease with heart failure and with stage 5 chronic kidney disease, or end stage renal disease: Secondary | ICD-10-CM | POA: Insufficient documentation

## 2020-12-24 DIAGNOSIS — M545 Low back pain, unspecified: Secondary | ICD-10-CM

## 2020-12-24 DIAGNOSIS — I509 Heart failure, unspecified: Secondary | ICD-10-CM | POA: Diagnosis not present

## 2020-12-24 DIAGNOSIS — Y92002 Bathroom of unspecified non-institutional (private) residence single-family (private) house as the place of occurrence of the external cause: Secondary | ICD-10-CM | POA: Insufficient documentation

## 2020-12-24 DIAGNOSIS — Z79899 Other long term (current) drug therapy: Secondary | ICD-10-CM | POA: Diagnosis not present

## 2020-12-24 DIAGNOSIS — N186 End stage renal disease: Secondary | ICD-10-CM | POA: Diagnosis not present

## 2020-12-24 DIAGNOSIS — S0990XA Unspecified injury of head, initial encounter: Secondary | ICD-10-CM | POA: Diagnosis present

## 2020-12-24 DIAGNOSIS — S0081XA Abrasion of other part of head, initial encounter: Secondary | ICD-10-CM | POA: Insufficient documentation

## 2020-12-24 DIAGNOSIS — R296 Repeated falls: Secondary | ICD-10-CM

## 2020-12-24 DIAGNOSIS — W01198A Fall on same level from slipping, tripping and stumbling with subsequent striking against other object, initial encounter: Secondary | ICD-10-CM | POA: Diagnosis not present

## 2020-12-24 DIAGNOSIS — Z992 Dependence on renal dialysis: Secondary | ICD-10-CM | POA: Insufficient documentation

## 2020-12-24 DIAGNOSIS — E1122 Type 2 diabetes mellitus with diabetic chronic kidney disease: Secondary | ICD-10-CM | POA: Diagnosis not present

## 2020-12-24 DIAGNOSIS — I10 Essential (primary) hypertension: Secondary | ICD-10-CM | POA: Diagnosis not present

## 2020-12-24 DIAGNOSIS — Z8616 Personal history of COVID-19: Secondary | ICD-10-CM | POA: Insufficient documentation

## 2020-12-24 LAB — COMPREHENSIVE METABOLIC PANEL
ALT: 11 U/L (ref 0–44)
AST: 15 U/L (ref 15–41)
Albumin: 3 g/dL — ABNORMAL LOW (ref 3.5–5.0)
Alkaline Phosphatase: 43 U/L (ref 38–126)
Anion gap: 12 (ref 5–15)
BUN: 78 mg/dL — ABNORMAL HIGH (ref 6–20)
CO2: 27 mmol/L (ref 22–32)
Calcium: 8.4 mg/dL — ABNORMAL LOW (ref 8.9–10.3)
Chloride: 94 mmol/L — ABNORMAL LOW (ref 98–111)
Creatinine, Ser: 8.52 mg/dL — ABNORMAL HIGH (ref 0.61–1.24)
GFR, Estimated: 7 mL/min — ABNORMAL LOW (ref >60)
Glucose, Bld: 115 mg/dL — ABNORMAL HIGH (ref 70–99)
Potassium: 3.4 mmol/L — ABNORMAL LOW (ref 3.5–5.1)
Sodium: 133 mmol/L — ABNORMAL LOW (ref 135–145)
Total Bilirubin: 0.5 mg/dL (ref 0.3–1.2)
Total Protein: 6.6 g/dL (ref 6.5–8.1)

## 2020-12-24 LAB — CBC WITH DIFFERENTIAL/PLATELET
Abs Immature Granulocytes: 0.03 10*3/uL (ref 0.00–0.07)
Basophils Absolute: 0 10*3/uL (ref 0.0–0.1)
Basophils Relative: 0 %
Eosinophils Absolute: 0.1 10*3/uL (ref 0.0–0.5)
Eosinophils Relative: 1 %
HCT: 32 % — ABNORMAL LOW (ref 39.0–52.0)
Hemoglobin: 10.7 g/dL — ABNORMAL LOW (ref 13.0–17.0)
Immature Granulocytes: 0 %
Lymphocytes Relative: 14 %
Lymphs Abs: 0.9 10*3/uL (ref 0.7–4.0)
MCH: 36 pg — ABNORMAL HIGH (ref 26.0–34.0)
MCHC: 33.4 g/dL (ref 30.0–36.0)
MCV: 107.7 fL — ABNORMAL HIGH (ref 80.0–100.0)
Monocytes Absolute: 0.8 10*3/uL (ref 0.1–1.0)
Monocytes Relative: 12 %
Neutro Abs: 4.9 10*3/uL (ref 1.7–7.7)
Neutrophils Relative %: 73 %
Platelets: 96 10*3/uL — ABNORMAL LOW (ref 150–400)
RBC: 2.97 MIL/uL — ABNORMAL LOW (ref 4.22–5.81)
RDW: 12.2 % (ref 11.5–15.5)
WBC: 6.8 10*3/uL (ref 4.0–10.5)
nRBC: 0 % (ref 0.0–0.2)

## 2020-12-24 LAB — CBG MONITORING, ED: Glucose-Capillary: 115 mg/dL — ABNORMAL HIGH (ref 70–99)

## 2020-12-24 MED ORDER — OXYCODONE-ACETAMINOPHEN 5-325 MG PO TABS: 1.0000 | ORAL_TABLET | Freq: Three times a day (TID) | ORAL | 0 refills | Status: DC | PRN

## 2020-12-24 MED ORDER — OXYCODONE-ACETAMINOPHEN 5-325 MG PO TABS
1.0000 | ORAL_TABLET | Freq: Once | ORAL | Status: AC
  Administered 2020-12-24: 1 via ORAL
  Filled 2020-12-24: qty 1

## 2020-12-24 NOTE — ED Provider Notes (Signed)
[facility] EMERGENCY DEPARTMENT Provider Note   CSN: [id number] Arrival date & time: 12/24/20  1026     History Chief Complaint  Patient presents with  . Fall    Timothy Clark is a 50 y.o. male.  HPI Patient is a 50 year old male with a complex medical history noted below.  He presents to the emergency department due to a fall that occurred 4 days ago.  He states that his blood sugar dropped to 47, he became weak, and he fell to the floor.  Only notes a single fall.  States that he struck the left side of his face causing an abrasion to the left side of the head.  Also notes moderate right low back pain.  Unsure if he syncopized during the event.  States that he has had no head pain or headache since the fall.  States his back pain has been constant.  Worsens with ambulation.  Denies any chest pain, abdominal pain, pelvic pain, pain in the lower extremities.  No numbness or weakness.  No bowel or bladder incontinence.  Patient also notes that he has a history of ESRD on HD.  He is a Monday, Wednesday, as well as Friday dialysis patient.  States that he is due for dialysis today and is planning on going to the dialysis center after being discharged.  Patient denies being anticoagulated.    Past Medical History:  Diagnosis Date  . Anemia   . Anxiety   . CHF (congestive heart failure) (HCC)    diastolic dysfunction  . Chronic abdominal pain   . COVID-19 07/2019  . Depression   . Esophagitis   . ESRD on hemodialysis [facility])    undergoing evaluation for transplant  . Eye hemorrhage   . Gastritis   . Gastroparesis   . GERD (gastroesophageal reflux disease)   . Glaucoma   . Helicobacter pylori (H. pylori) infection 2016   ERADICATION DOCUMENTED VIA STOOL TEST in AUG 2016 at [place]  . Hiccups   . Hypertension   . Nausea and vomiting    chronic, recurrent  . Neuropathy    feet  . Renal insufficiency   . Type 2 diabetes mellitus with complications ([place])    diagnosed around age  74  . Vitreous hemorrhage of left eye ([place]) 12/15/2019    Patient Active Problem List   Diagnosis Date Noted  . Problem with vascular access 09/17/2020  . Bipolar disorder ([place]) 09/17/2020  . Acute encephalopathy 07/24/2020  . Intractable abdominal pain 07/23/2020  . Encephalopathy   . Colon cancer screening 07/12/2020  . 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 Stable treated proliferative diabetic retinopathy of right eye determined by examination associated with type 2 diabetes mellitus ([place]) 07/03/2020  . Anxiety 03/13/2020  . History of COVID-19 03/13/2020  . History of gastroesophageal reflux (GERD) 03/13/2020  . History of Helicobacter pylori infection 03/13/2020  . Pre-transplant evaluation for kidney transplant 03/13/2020  . Renovascular hypertension 03/13/2020  . Hematemesis 03/02/2020  . Coffee ground emesis 03/01/2020  . Complication of vascular access for dialysis 01/29/2020  . Follow-up examination after eye surgery 12/15/2019  . Type 2 diabetes with nephropathy ([place]) 12/15/2019  . Controlled diabetes mellitus with stable proliferative retinopathy of left eye, with long-term current use of insulin ([place]) 12/15/2019  . Secondary glaucoma due to combination mechanisms, left, severe stage 12/15/2019  . Nausea with vomiting 12/08/2019  . Hyperglycemia due to type 2 diabetes mellitus ([place]) 10/28/2019  . DKA (diabetic ketoacidoses) 10/27/2019  .  ESRD on dialysis ([place]) 06/26/2019  . Constipation 03/15/2019  . Chronic renal insufficiency, stage V ([place]) 02/21/2019  . Type 2 diabetes mellitus with proliferative retinopathy ([place]) 10/25/2018  . Acute renal failure superimposed on stage 4 chronic kidney disease ([place]) 10/25/2018  . Extravasation injury of IV catheter site with other complication, initial encounter ([place])   . Intractable vomiting   . Chronic kidney disease   . Poor venous access   . Gastroparesis 10/24/2018  . Closed displaced fracture of lateral end of right clavicle  06/18/2018  . HTN (hypertension), malignant 06/13/2018  . Scrotal swelling 11/10/2017  . Swelling 11/09/2017  . Scrotal edema   . Obesity, Class III, BMI 40-49.9 (morbid obesity) ([place]) 11/02/2017  . CKD stage 3 due to type 2 diabetes mellitus ([place]) 10/30/2017  . Wound eschars of foot, right  10/30/2017  . Chronic abdominal pain 09/21/2017  . Hypertensive urgency 09/21/2017  . Prolonged QT interval 09/21/2017  . GERD (gastroesophageal reflux disease) 07/14/2017  . Glaucoma 07/14/2017  . Anemia 07/14/2017  . Pressure injury of skin 02/01/2017  . Insulin dependent diabetes mellitus 09/07/2016  . Orthostatic syncope 09/05/2016  . 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 Gastroparesis due to DM ([place]) 02/06/2015  . Essential hypertension 12/06/2014  . Diabetic macular edema ([place]) 02/18/2014  . PDR (proliferative diabetic retinopathy) ([place]) 02/06/2014  . Neovascular glaucoma of left eye 01/11/2014  . Diabetic infection of left heel ([place]) 02/14/2010  . UNSPECIFIED PERIPHERAL VASCULAR DISEASE 10/19/2009  . ERECTILE DYSFUNCTION, ORGANIC 10/19/2009  . NUMBNESS 07/20/2009  . Diabetes mellitus type 2 with complications, uncontrolled ([place]) 03/22/2009  . Hyperlipidemia 03/22/2009  . Depression 03/22/2009    Past Surgical History:  Procedure Laterality Date  . AMPUTATION TOE Right    great toe  . AV FISTULA INSERTION W/ RF MAGNETIC GUIDANCE Left 04/06/2019   Procedure: AV FISTULA INSERTION W/RF MAGNETIC GUIDANCE;  Surgeon: Katha Cabal, MD;  Location: [place] [facility];  Service: Cardiovascular;  Laterality: Left;  . AV FISTULA PLACEMENT Left 05/06/2019   Procedure: INSERTION OF ARTERIOVENOUS (AV) GORE-TEX GRAFT ARM ( FOREARM LOOP );  Surgeon: Katha Cabal, MD;  Location: [facility] ORS;  Service: Vascular;  Laterality: Left;  . AV FISTULA PLACEMENT Left 03/21/2020   Procedure: INSERTION OF ARTERIOVENOUS (AV) GORE-TEX GRAFT ARM ( BRACHIAL AXILLARY );  Surgeon: Katha Cabal, MD;  Location:  [facility] ORS;  Service: Vascular;  Laterality: Left;  . CATARACT EXTRACTION W/PHACO Left 05/19/2016   Procedure: CATARACT EXTRACTION PHACO AND INTRAOCULAR LENS PLACEMENT LEFT EYE;  Surgeon: Tonny Branch, MD;  Location: [facility];  Service: Ophthalmology;  Laterality: Left;  CDE: 7.30  . CATARACT EXTRACTION W/PHACO Right 06/23/2016   Procedure: CATARACT EXTRACTION PHACO AND INTRAOCULAR LENS PLACEMENT RIGHT EYE CDE=9.87;  Surgeon: Tonny Branch, MD;  Location: [facility];  Service: Ophthalmology;  Laterality: Right;  right  . DIALYSIS/PERMA CATHETER REMOVAL N/A 07/19/2019   Procedure: DIALYSIS/PERMA CATHETER REMOVAL;  Surgeon: Katha Cabal, MD;  Location: [place] [facility];  Service: Cardiovascular;  Laterality: N/A;  . ESOPHAGOGASTRODUODENOSCOPY  2015   Dr. Britta Mccreedy  . ESOPHAGOGASTRODUODENOSCOPY N/A 10/26/2014   RMR: Distal esophagititis-likely reflux related although an element of pill induced injuruy no exclueded  status post biopsy. Diffusely abnormal gastric mucosa of uncertain signigicane -status post gastric biopsy. Focal area of excoriation in the cardia most consistant with a trauma of heaving.   . ESOPHAGOGASTRODUODENOSCOPY  08/2015   [facility]: mild esophagitis and old gastric contents  . EYE SURGERY Left 2015  stent placed to left eye  . EYE SURGERY Right 06/2019   per patient- to remove scar tissue  . INCISION AND DRAINAGE OF WOUND Right 07/16/2017   Procedure: IRRIGATION AND DEBRIDEMENT OF SOFT TISSUE OF ULCERATION RIGHT FOOT;  Surgeon: Caprice Beaver, DPM;  Location: [facility];  Service: Podiatry;  Laterality: Right;  . IR THROMBECTOMY AV FISTULA W/THROMBOLYSIS/PTA INC/SHUNT/IMG LEFT Left 09/19/2020  . IR US GUIDE VASC ACCESS LEFT  09/19/2020  . PERIPHERAL VASCULAR THROMBECTOMY Left 09/27/2019   Procedure: PERIPHERAL VASCULAR THROMBECTOMY;  Surgeon: Katha Cabal, MD;  Location: [place] [facility];  Service: Cardiovascular;  Laterality: Left;  . PERIPHERAL VASCULAR THROMBECTOMY Left  10/27/2019   Procedure: PERIPHERAL VASCULAR THROMBECTOMY;  Surgeon: Algernon Huxley, MD;  Location: [place] [facility];  Service: Cardiovascular;  Laterality: Left;  . PERIPHERAL VASCULAR THROMBECTOMY Left 12/13/2019   Procedure: PERIPHERAL VASCULAR THROMBECTOMY;  Surgeon: Katha Cabal, MD;  Location: [place] [facility];  Service: Cardiovascular;  Laterality: Left;       Family History  Problem Relation Age of Onset  . Ovarian cancer Mother   . Heart attack Father   . Colon polyps Father   . Cervical cancer Sister   . Diabetes Sister   . Colon cancer Neg Hx     Social History   Tobacco Use  . Smoking status: Never Smoker  . Smokeless tobacco: Never Used  Vaping Use  . Vaping Use: Never used  Substance Use Topics  . Alcohol use: No    Alcohol/week: 0.0 standard drinks  . Drug use: No    Home Medications Prior to Admission medications   Medication Sig Start Date End Date Taking? Authorizing Provider  oxyCODONE-acetaminophen (PERCOCET/ROXICET) 5-325 MG tablet Take 1 tablet by mouth every 8 (eight) hours as needed for severe pain. 12/24/20  Yes Rayna Sexton, PA-C  ALPRAZolam Duanne Moron) 0.5 MG tablet Take 0.5 mg by mouth 2 (two) times daily as needed for anxiety.    [provider]  amLODipine (NORVASC) 5 MG tablet Take 5 mg by mouth daily.    [provider]  atorvastatin (LIPITOR) 10 MG tablet Take 1 tablet (10 mg total) by mouth at bedtime. 10/13/20 11/12/20  Johnson, Clanford L, MD  brimonidine (ALPHAGAN) 0.15 % ophthalmic solution Place 1 drop into both eyes in the morning and at bedtime.     [provider]  dorzolamide-timolol (COSOPT) 22.3-6.8 MG/ML ophthalmic solution Place 1 drop into both eyes 2 (two) times daily.  01/16/20   [provider]  folic acid (FOLVITE) 1 MG tablet Take 1 mg by mouth daily.    [provider]  gentamicin cream (GARAMYCIN) 0.1 % Apply 1 application topically 2 (two) times daily. 12/12/20   Edrick Kins, DPM  Glucagon (GVOKE HYPOPEN 2-PACK) 1 MG/0.2ML SOAJ Inject 1 Syringe into the skin as needed (hypoglycemia).    [provider]  Iron-FA-B Cmp-C-Biot-Probiotic (FUSION PLUS PO) Take 1 capsule by mouth daily.    [provider]  LEADER UNIFINE PENTIPS PLUS 32G X 4 MM MISC  05/17/20   [provider]  metoCLOPramide (REGLAN) 5 MG tablet Take 1 tablet (5 mg total) by mouth 3 (three) times daily before meals for 10 days. 10/13/20 10/23/20  Murlean Iba, MD  pantoprazole (PROTONIX) 40 MG tablet Take 1 tablet (40 mg total) by mouth 2 (two) times daily before a meal. 08/24/18   Annitta Needs, NP  polyethylene glycol (MIRALAX / GLYCOLAX) 17 g packet  Take 17 g by mouth daily as needed for mild constipation. 10/13/20   Johnson, Clanford L, MD  senna-docusate (SENOKOT-S) 8.6-50 MG tablet Take 1 tablet by mouth 2 (two) times daily. 10/13/20   Johnson, Clanford L, MD  sitaGLIPtin (JANUVIA) 25 MG tablet Take 25 mg by mouth daily.     [provider]  sucralfate (CARAFATE) 1 g tablet Take 1 g by mouth 4 (four) times daily.    [provider]  valproic acid (DEPAKENE) 250 MG capsule Take 3 capsules (750 mg total) by mouth 3 (three) times daily. 10/13/20 11/12/20  Murlean Iba, MD  ziprasidone (GEODON) 20 MG capsule Take 1 tab with breakfast and 2 tabs at [facility] 10/13/20   Johnson, Clanford L, MD    Allergies    Donnatal [phenobarbital-belladonna alk], Fish allergy, Haldol [haloperidol lactate], Lidocaine, Maalox [calcium carbonate antacid], Shellfish allergy, Aspirin, Doxycycline, Keflex [cephalexin], Marinol [dronabinol], Other, Penicillins, and Bactrim [sulfamethoxazole-trimethoprim]  Review of Systems   Review of Systems  All other systems reviewed and are negative. Ten systems reviewed and are negative for acute change, except as noted in the HPI.   Physical Exam Updated Vital Signs BP (!) 143/76   Pulse 77   Temp 98.1 F (36.7 C) (Oral)   Resp 15   Ht 6'  (1.829 m)   Wt 120 kg   SpO2 100%   BMI 35.88 kg/m   Physical Exam Vitals and nursing note reviewed.  Constitutional:      General: He is not in acute distress.    Appearance: Normal appearance. He is not ill-appearing, toxic-appearing or diaphoretic.  HENT:     Head: Normocephalic.     Comments: Abrasion noted to the left side of the head.  Well-healing.  No bleeding noted from the site.  Minimal tenderness overlying the site.    Right Ear: External ear normal.     Left Ear: External ear normal.     Nose: Nose normal.     Mouth/Throat:     Mouth: Mucous membranes are moist.     Pharynx: Oropharynx is clear. No oropharyngeal exudate or posterior oropharyngeal erythema.  Eyes:     General: No scleral icterus.       Right eye: No discharge.        Left eye: No discharge.     Conjunctiva/sclera: Conjunctivae normal.  Cardiovascular:     Rate and Rhythm: Normal rate and regular rhythm.     Pulses: Normal pulses.     Heart sounds: Normal heart sounds. No murmur heard. No friction rub. No gallop.   Pulmonary:     Effort: Pulmonary effort is normal. No respiratory distress.     Breath sounds: Normal breath sounds. No stridor. No wheezing, rhonchi or rales.  Abdominal:     General: Abdomen is flat.     Tenderness: There is no abdominal tenderness.  Musculoskeletal:        General: Tenderness present. Normal range of motion.     Cervical back: Normal range of motion and neck supple. No tenderness.     Comments: Moderate TTP with a palpable spasm noted to the right lumbar musculature.  No midline C, T, or L-spine tenderness.  No step-offs, crepitus, or deformities.  Skin:    General: Skin is warm and dry.  Neurological:     General: No focal deficit present.     Mental Status: He is alert and oriented to person, place, and time.     Comments: A&O  x3.  Moving all 4 extremities with ease.  No gross deficits.  Strength is 5 out of 5 in all 4 extremities.  Psychiatric:        Mood  and Affect: Mood normal.        Behavior: Behavior normal.    ED Results / Procedures / Treatments   Labs (all labs ordered are listed, but only abnormal results are displayed) Labs Reviewed  COMPREHENSIVE METABOLIC PANEL - Abnormal; Notable for the following components:      Result Value   Sodium 133 (*)    Potassium 3.4 (*)    Chloride 94 (*)    Glucose, Bld 115 (*)    BUN 78 (*)    Creatinine, Ser 8.52 (*)    Calcium 8.4 (*)    Albumin 3.0 (*)    GFR, Estimated 7 (*)    All other components within normal limits  CBC WITH DIFFERENTIAL/PLATELET - Abnormal; Notable for the following components:   RBC 2.97 (*)    Hemoglobin 10.7 (*)    HCT 32.0 (*)    MCV 107.7 (*)    MCH 36.0 (*)    Platelets 96 (*)    All other components within normal limits  CBG MONITORING, ED - Abnormal; Notable for the following components:   Glucose-Capillary 115 (*)    All other components within normal limits    EKG None  Radiology DG Lumbar Spine Complete  Result Date: 12/24/2020 CLINICAL DATA:  Low back pain after falling.  Hypoglycemia. EXAM: LUMBAR SPINE - COMPLETE 4+ VIEW COMPARISON:  Abdominopelvic CT 10/13/2018. FINDINGS: There are 5 lumbar type vertebral bodies. The alignment is normal. There is moderate spondylosis with disc space narrowing and endplate osteophytes at L3-4 and L4-5. Mild facet degenerative changes are present inferiorly. No evidence of acute fracture or pars defect. IMPRESSION: No evidence of acute lumbar spine injury. Moderate lower lumbar spondylosis, similar to previous abdominal CT. Electronically Signed   By: Richardean Sale M.D.   On: 12/24/2020 12:27    Procedures Procedures   Medications Ordered in ED Medications  oxyCODONE-acetaminophen (PERCOCET/ROXICET) 5-325 MG per tablet 1 tablet (1 tablet Oral Given 12/24/20 1110)    ED Course  I have reviewed the triage vital signs and the nursing notes.  Pertinent labs & imaging results that were available during my  care of the patient were reviewed by me and considered in my medical decision making (see chart for details).    MDM Rules/Calculators/A&P                          Pt is a 50 y.o. male who presents to the emergency department due to a fall that occurred last week.  Labs: CBC with hemoglobin of 10.7, MCV of 107.7, platelets 96. CMP with a sodium of 133, potassium of 3.4, chloride of 94, glucose of 115, BUN of 78, creatinine of 8.52, GFR 7.  Patient has history of ESRD on HD Monday, Wednesday, and Friday.  Last dialysis session was 3 days ago.  Imaging: X-ray of the lumbar spine shows no evidence of acute lumbar spine injury.  I, Rayna Sexton, PA-C, personally reviewed and evaluated these images and lab results as part of my medical decision-making.  Patient has palpable pain in the right lumbar region along the lumbar musculature.  No midline spine pain in the cervical, thoracic, or lumbar region.  Patient given a dose of Percocet with almost complete resolution of his  pain.  Patient is neurovascularly intact in the lower extremities.  Denies any bowel or bladder incontinence.  No red flags. Imaging is reassuring.   Patient does note that he is a Monday, Wednesday, and Friday dialysis patient and was supposed to be dialyzed today.  Potassium is 3.4.  His significant other is now at bedside and states that she is going to take him to dialysis first thing tomorrow morning.  He is denying any chest pain or shortness of breath.  Will discharge patient on an additional course of Percocet for the next few days for breakthrough pain.  We discussed safety regarding this medication.  His wife states that she is going to follow-up with his PCP right after being discharged this afternoon.  On further discussion he notes that he is on NovoLog twice per day for his diabetes mellitus in addition to [place].  He states that he takes 35 units once per day and 25 units once per day.  Feel that his insulin  dosing is likely too high and is causing his hyperglycemia and recurrent falls.  Recommended decreasing the dose to 30 and 20 respectively.  Urged him to make sure that he discusses this with his PCP as soon as possible.  Discussed return precautions and the length.  Feel the patient is stable for discharge at this time and he is agreeable.  His questions were answered and he was amicable at the time of discharge.  Note: Portions of this report may have been transcribed using voice recognition software. Every effort was made to ensure accuracy; however, inadvertent computerized transcription errors may be present.    Final Clinical Impression(s) / ED Diagnoses Final diagnoses:  Right lumbar pain  Recurrent falls    Rx / DC Orders ED Discharge Orders         Ordered    oxyCODONE-acetaminophen (PERCOCET/ROXICET) 5-325 MG tablet  Every 8 hours PRN        12/24/20 1337           Rayna Sexton, PA-C 12/24/20 1351    Luna Fuse, MD 12/24/20 1542

## 2020-12-24 NOTE — ED Notes (Signed)
Pt blind and unable to sign MSE.

## 2020-12-24 NOTE — ED Triage Notes (Signed)
Pt presents to ED following fall at home Thursday night. Pt states his blood sugar dropped to 47, he became weak and fell to the floor, hitting his head and his back. Pt denies LOC. Pt c/o right sided rib pain.

## 2020-12-24 NOTE — Discharge Instructions (Signed)
I have prescribed you a strong narcotic called Percocet. Please only take this as prescribed. I would recommend taking ibuprofen or tylenol for your pain throughout the day and only take this medication for breakthrough pain that you cannot control. This medication also has tylenol in it, so please be sure you are not taking more than 3000 mg of tylenol per day. Do not drive or operate heavy machinery after taking this medication. Do not mix it with alcohol.   Please follow-up with your regular doctor soon as possible.  You need to schedule a follow-up appointment to discuss your insulin dosage.  This is likely causing your hypoglycemia as well as your recurrent falls.  Attached is a note saying that you can return for dialysis.  I would recommend keeping glucose pills close by in the event that you have low blood sugar while you are going through dialysis.  If you develop any new or worsening symptoms, please come back to the emergency department immediately for reevaluation.  It was a pleasure to meet you.

## 2020-12-25 DIAGNOSIS — I5032 Chronic diastolic (congestive) heart failure: Secondary | ICD-10-CM | POA: Diagnosis not present

## 2020-12-25 DIAGNOSIS — E1122 Type 2 diabetes mellitus with diabetic chronic kidney disease: Secondary | ICD-10-CM | POA: Diagnosis not present

## 2020-12-25 DIAGNOSIS — Z992 Dependence on renal dialysis: Secondary | ICD-10-CM | POA: Diagnosis not present

## 2020-12-25 DIAGNOSIS — N186 End stage renal disease: Secondary | ICD-10-CM | POA: Diagnosis not present

## 2020-12-25 DIAGNOSIS — I132 Hypertensive heart and chronic kidney disease with heart failure and with stage 5 chronic kidney disease, or end stage renal disease: Secondary | ICD-10-CM | POA: Diagnosis not present

## 2020-12-25 DIAGNOSIS — D631 Anemia in chronic kidney disease: Secondary | ICD-10-CM | POA: Diagnosis not present

## 2020-12-25 DIAGNOSIS — E1143 Type 2 diabetes mellitus with diabetic autonomic (poly)neuropathy: Secondary | ICD-10-CM | POA: Diagnosis not present

## 2020-12-26 DIAGNOSIS — N186 End stage renal disease: Secondary | ICD-10-CM | POA: Diagnosis not present

## 2020-12-26 DIAGNOSIS — Z992 Dependence on renal dialysis: Secondary | ICD-10-CM | POA: Diagnosis not present

## 2020-12-28 DIAGNOSIS — N186 End stage renal disease: Secondary | ICD-10-CM | POA: Diagnosis not present

## 2020-12-28 DIAGNOSIS — Z992 Dependence on renal dialysis: Secondary | ICD-10-CM | POA: Diagnosis not present

## 2020-12-31 DIAGNOSIS — E1122 Type 2 diabetes mellitus with diabetic chronic kidney disease: Secondary | ICD-10-CM | POA: Diagnosis not present

## 2020-12-31 DIAGNOSIS — R110 Nausea: Secondary | ICD-10-CM | POA: Diagnosis not present

## 2020-12-31 DIAGNOSIS — W19XXXA Unspecified fall, initial encounter: Secondary | ICD-10-CM | POA: Diagnosis not present

## 2020-12-31 DIAGNOSIS — I120 Hypertensive chronic kidney disease with stage 5 chronic kidney disease or end stage renal disease: Secondary | ICD-10-CM | POA: Diagnosis not present

## 2020-12-31 DIAGNOSIS — F29 Unspecified psychosis not due to a substance or known physiological condition: Secondary | ICD-10-CM | POA: Diagnosis not present

## 2020-12-31 DIAGNOSIS — G319 Degenerative disease of nervous system, unspecified: Secondary | ICD-10-CM | POA: Diagnosis not present

## 2020-12-31 DIAGNOSIS — R9431 Abnormal electrocardiogram [ECG] [EKG]: Secondary | ICD-10-CM | POA: Diagnosis not present

## 2020-12-31 DIAGNOSIS — N186 End stage renal disease: Secondary | ICD-10-CM | POA: Diagnosis not present

## 2020-12-31 DIAGNOSIS — J9811 Atelectasis: Secondary | ICD-10-CM | POA: Diagnosis not present

## 2020-12-31 DIAGNOSIS — Z20822 Contact with and (suspected) exposure to covid-19: Secondary | ICD-10-CM | POA: Diagnosis not present

## 2020-12-31 DIAGNOSIS — Z992 Dependence on renal dialysis: Secondary | ICD-10-CM | POA: Diagnosis not present

## 2020-12-31 DIAGNOSIS — R569 Unspecified convulsions: Secondary | ICD-10-CM | POA: Diagnosis not present

## 2020-12-31 DIAGNOSIS — R4182 Altered mental status, unspecified: Secondary | ICD-10-CM | POA: Diagnosis not present

## 2020-12-31 DIAGNOSIS — R404 Transient alteration of awareness: Secondary | ICD-10-CM | POA: Diagnosis not present

## 2020-12-31 DIAGNOSIS — Z794 Long term (current) use of insulin: Secondary | ICD-10-CM | POA: Diagnosis not present

## 2020-12-31 NOTE — Progress Notes (Signed)
   Subjective:  50 y.o. male with PMHx of diabetes mellitus, ESRD, presenting for follow-up evaluation of an ulcer that is developed to the left hallux. Patient states that the nurse comes out weekly to change the dressings. The nurse continues to apply silver alginate to the left hallux wound weekly.  No new complaints this time  Past Medical History:  Diagnosis Date  . Anemia   . Anxiety   . CHF (congestive heart failure) (HCC)    diastolic dysfunction  . Chronic abdominal pain   . COVID-19 07/2019  . Depression   . Esophagitis   . ESRD on hemodialysis [facility])    undergoing evaluation for transplant  . Eye hemorrhage   . Gastritis   . Gastroparesis   . GERD (gastroesophageal reflux disease)   . Glaucoma   . Helicobacter pylori (H. pylori) infection 2016   ERADICATION DOCUMENTED VIA STOOL TEST in AUG 2016 at [place]  . Hiccups   . Hypertension   . Nausea and vomiting    chronic, recurrent  . Neuropathy    feet  . Renal insufficiency   . Type 2 diabetes mellitus with complications ([place])    diagnosed around age 76  . Vitreous hemorrhage of left eye ([place]) 12/15/2019    Objective/Physical Exam General: The patient is alert and oriented x3 in no acute distress.  Dermatology:  Wound #1 noted to the left hallux measuring approximately 0.5x0.5 x 0.1 cm (LxWxD).   To the noted ulceration(s), there is no eschar. There is a moderate amount of slough, fibrin, and necrotic tissue noted. Granulation tissue and wound base is red. There is a minimal amount of serosanguineous drainage noted. There is no exposed bone muscle-tendon ligament or joint. There is no malodor. Periwound integrity is intact. Skin is warm, dry and supple bilateral lower extremities.  Thickened, hyperkeratotic, dystrophic, elongated nails also noted bilateral  Vascular: Palpable pedal pulses bilaterally. No edema or erythema noted. Capillary refill within normal limits.  Neurological: Epicritic and protective  threshold diminished bilaterally.   Musculoskeletal Exam: History of partial first ray amputation right foot  Assessment: 1.  Ulcer left hallux  secondary to diabetes mellitus 2. diabetes mellitus w/ peripheral neuropathy 3. H/o partial first ray amputation right foot   Plan of Care:  1. Patient was evaluated.  2. medically necessary excisional debridement including subcutaneous tissue was performed using a tissue nipper and a chisel blade. Excisional debridement of all the necrotic nonviable tissue down to healthy bleeding viable tissue was performed with post-debridement measurements same as pre-. 3. the wound was cleansed and dry sterile dressing applied. 4.  Continue home health dressing changes weekly  5.  Continue diabetic shoes and insoles with the felt offloading dancers pads 6.  Prescription for gentamicin cream.  Apply daily 7.  Return to clinic in 4 weeks  Edrick Kins, DPM [facility]  Dr. Edrick Kins, DPM    [address], [place] [address]                Office [phone]  Fax [phone]

## 2021-01-02 ENCOUNTER — Encounter (HOSPITAL_COMMUNITY): Payer: Self-pay | Admitting: Family Medicine

## 2021-01-02 ENCOUNTER — Observation Stay (HOSPITAL_COMMUNITY)
Admission: EM | Admit: 2021-01-02 | Discharge: 2021-01-04 | Disposition: A | Discharge status: Home / Self Care | Payer: Medicare HMO | Source: Other Acute Inpatient Hospital | Attending: Internal Medicine | Admitting: Internal Medicine

## 2021-01-02 DIAGNOSIS — Z7984 Long term (current) use of oral hypoglycemic drugs: Secondary | ICD-10-CM | POA: Diagnosis not present

## 2021-01-02 DIAGNOSIS — I10 Essential (primary) hypertension: Secondary | ICD-10-CM | POA: Diagnosis not present

## 2021-01-02 DIAGNOSIS — F310 Bipolar disorder, current episode hypomanic: Secondary | ICD-10-CM

## 2021-01-02 DIAGNOSIS — E1122 Type 2 diabetes mellitus with diabetic chronic kidney disease: Secondary | ICD-10-CM | POA: Insufficient documentation

## 2021-01-02 DIAGNOSIS — Z8616 Personal history of COVID-19: Secondary | ICD-10-CM | POA: Diagnosis not present

## 2021-01-02 DIAGNOSIS — F32A Depression, unspecified: Secondary | ICD-10-CM | POA: Diagnosis present

## 2021-01-02 DIAGNOSIS — N186 End stage renal disease: Secondary | ICD-10-CM | POA: Diagnosis not present

## 2021-01-02 DIAGNOSIS — R2681 Unsteadiness on feet: Secondary | ICD-10-CM | POA: Diagnosis not present

## 2021-01-02 DIAGNOSIS — R569 Unspecified convulsions: Principal | ICD-10-CM

## 2021-01-02 DIAGNOSIS — I5032 Chronic diastolic (congestive) heart failure: Secondary | ICD-10-CM | POA: Insufficient documentation

## 2021-01-02 DIAGNOSIS — Z79899 Other long term (current) drug therapy: Secondary | ICD-10-CM | POA: Insufficient documentation

## 2021-01-02 DIAGNOSIS — F319 Bipolar disorder, unspecified: Secondary | ICD-10-CM | POA: Diagnosis present

## 2021-01-02 DIAGNOSIS — E1121 Type 2 diabetes mellitus with diabetic nephropathy: Secondary | ICD-10-CM | POA: Diagnosis present

## 2021-01-02 DIAGNOSIS — F419 Anxiety disorder, unspecified: Secondary | ICD-10-CM | POA: Diagnosis present

## 2021-01-02 DIAGNOSIS — Z992 Dependence on renal dialysis: Secondary | ICD-10-CM

## 2021-01-02 DIAGNOSIS — I132 Hypertensive heart and chronic kidney disease with heart failure and with stage 5 chronic kidney disease, or end stage renal disease: Secondary | ICD-10-CM | POA: Insufficient documentation

## 2021-01-02 DIAGNOSIS — R4182 Altered mental status, unspecified: Secondary | ICD-10-CM | POA: Diagnosis not present

## 2021-01-02 LAB — CBC
HCT: 32.2 % — ABNORMAL LOW (ref 39.0–52.0)
Hemoglobin: 10.7 g/dL — ABNORMAL LOW (ref 13.0–17.0)
MCH: 35.3 pg — ABNORMAL HIGH (ref 26.0–34.0)
MCHC: 33.2 g/dL (ref 30.0–36.0)
MCV: 106.3 fL — ABNORMAL HIGH (ref 80.0–100.0)
Platelets: 145 10*3/uL — ABNORMAL LOW (ref 150–400)
RBC: 3.03 MIL/uL — ABNORMAL LOW (ref 4.22–5.81)
RDW: 12.3 % (ref 11.5–15.5)
WBC: 6.3 10*3/uL (ref 4.0–10.5)
nRBC: 0 % (ref 0.0–0.2)

## 2021-01-02 LAB — BASIC METABOLIC PANEL
Anion gap: 12 (ref 5–15)
BUN: 55 mg/dL — ABNORMAL HIGH (ref 6–20)
CO2: 27 mmol/L (ref 22–32)
Calcium: 8.4 mg/dL — ABNORMAL LOW (ref 8.9–10.3)
Chloride: 94 mmol/L — ABNORMAL LOW (ref 98–111)
Creatinine, Ser: 7.97 mg/dL — ABNORMAL HIGH (ref 0.61–1.24)
GFR, Estimated: 8 mL/min — ABNORMAL LOW (ref >60)
Glucose, Bld: 158 mg/dL — ABNORMAL HIGH (ref 70–99)
Potassium: 3.4 mmol/L — ABNORMAL LOW (ref 3.5–5.1)
Sodium: 133 mmol/L — ABNORMAL LOW (ref 135–145)

## 2021-01-02 LAB — GLUCOSE, CAPILLARY: Glucose-Capillary: 148 mg/dL — ABNORMAL HIGH (ref 70–99)

## 2021-01-02 MED ORDER — AMLODIPINE BESYLATE 5 MG PO TABS: 5.0000 mg | ORAL_TABLET | Freq: Every day | ORAL | Status: DC

## 2021-01-02 MED ORDER — ATORVASTATIN CALCIUM 10 MG PO TABS
10.0000 mg | ORAL_TABLET | Freq: Every day | ORAL | Status: DC
  Administered 2021-01-02 – 2021-01-03 (×2): 10 mg via ORAL
  Filled 2021-01-02 (×2): qty 1

## 2021-01-02 MED ORDER — ACETAMINOPHEN 650 MG RE SUPP: 650.0000 mg | Freq: Four times a day (QID) | RECTAL | Status: DC | PRN

## 2021-01-02 MED ORDER — PANTOPRAZOLE SODIUM 40 MG PO TBEC
40.0000 mg | DELAYED_RELEASE_TABLET | Freq: Two times a day (BID) | ORAL | Status: DC
  Administered 2021-01-03 – 2021-01-04 (×4): 40 mg via ORAL
  Filled 2021-01-02 (×4): qty 1

## 2021-01-02 MED ORDER — ZIPRASIDONE HCL 20 MG PO CAPS
20.0000 mg | ORAL_CAPSULE | Freq: Every day | ORAL | Status: DC
  Administered 2021-01-03 – 2021-01-04 (×2): 20 mg via ORAL
  Filled 2021-01-02 (×3): qty 1

## 2021-01-02 MED ORDER — SODIUM CHLORIDE 0.9 % IV SOLN: 250.0000 mL | INTRAVENOUS | Status: DC | PRN

## 2021-01-02 MED ORDER — ZIPRASIDONE HCL 20 MG PO CAPS: 20.0000 mg | ORAL_CAPSULE | ORAL | Status: DC

## 2021-01-02 MED ORDER — INSULIN ASPART 100 UNIT/ML IJ SOLN: 0.0000 [IU] | Freq: Three times a day (TID) | INTRAMUSCULAR | Status: DC

## 2021-01-02 MED ORDER — VALPROIC ACID 250 MG PO CAPS
750.0000 mg | ORAL_CAPSULE | Freq: Three times a day (TID) | ORAL | Status: DC
  Administered 2021-01-03 (×2): 750 mg via ORAL
  Filled 2021-01-02 (×3): qty 3

## 2021-01-02 MED ORDER — ALPRAZOLAM 0.5 MG PO TABS: 0.5000 mg | ORAL_TABLET | Freq: Two times a day (BID) | ORAL | Status: DC | PRN

## 2021-01-02 MED ORDER — SUCRALFATE 1 G PO TABS
1.0000 g | ORAL_TABLET | Freq: Four times a day (QID) | ORAL | Status: DC
  Administered 2021-01-02 – 2021-01-04 (×6): 1 g via ORAL
  Filled 2021-01-02 (×7): qty 1

## 2021-01-02 MED ORDER — HEPARIN SODIUM (PORCINE) 5000 UNIT/ML IJ SOLN
5000.0000 [IU] | Freq: Three times a day (TID) | INTRAMUSCULAR | Status: DC
  Administered 2021-01-02 – 2021-01-04 (×5): 5000 [IU] via SUBCUTANEOUS
  Filled 2021-01-02 (×6): qty 1

## 2021-01-02 MED ORDER — ACETAMINOPHEN 325 MG PO TABS: 650.0000 mg | ORAL_TABLET | Freq: Four times a day (QID) | ORAL | Status: DC | PRN

## 2021-01-02 MED ORDER — LORAZEPAM 2 MG/ML IJ SOLN: 1.0000 mg | Freq: Four times a day (QID) | INTRAMUSCULAR | Status: DC | PRN

## 2021-01-02 MED ORDER — ZIPRASIDONE HCL 40 MG PO CAPS
40.0000 mg | ORAL_CAPSULE | Freq: Every day | ORAL | Status: DC
  Administered 2021-01-03 – 2021-01-04 (×3): 40 mg via ORAL
  Filled 2021-01-02 (×4): qty 1

## 2021-01-02 NOTE — Consult Note (Addendum)
NEUROLOGY CONSULTATION NOTE   Date of service: Jan 02, 2021 Patient Name: Timothy Clark MRN:  [id number] DOB:  [birth year] Reason for consult: "Seizure" Requesting Provider: Vianne Bulls, MD _ _ _   _ __   _ __ _ _  __ __   _ __   __ _  History of Present Illness  Timothy Clark is a 50 y.o. male with PMH significant for ESRD on dialysis, depression, chronic diastolic CHF, hypertension, diabetes type 2 with gastroparesis who initially presented to [facility] emergency department with seizure-like activity followed by altered mental status and confusion and combative.  He was given Haldol and Versed by EMS and noted to be poorly responsive for several hours in the emergency department and thus this case was discussed with meand plan was made to transfer this patient for further evaluation and monitoring.  Per ED provider notes at [facility], "EMS reports that they were called for seizure activity. Upon arrival, they noticed that the patient had episodes when he was chewing his lips as described by family. EMS did not notice any tonic-clonic seizure activity."  Of note, he has been evaluated by her team in the past for acute encephalopathy which has resolved spontaneously with no obvious organic cause identified.  Work-up in the past with EEG negative for seizures or epileptiform discharges.  09/20/2020 did capture some chewing movements with no concomitant EEG changes and were nonepileptic.  Patient has no recollection of the event. He reports his sister saw his episode. Thought he was swallowing his tongue and called EMS.   He is compliant with his Depakote and has not missed any doses. However VPA levels here < 10.  I attempted to contact his sister [name] but was unable to get in touch with her.   ROS   Constitutional Denies weight loss, fever and chills.  HEENT Denies changes in vision and hearing.  Respiratory Denies SOB and cough.  CV Denies palpitations and CP  GI  Denies abdominal pain, nausea, vomiting and diarrhea.  GU Denies dysuria and urinary frequency.  MSK Denies myalgia and joint pain.  Skin Denies rash and pruritus.  Neurological Denies headache and syncope.  Psychiatric Denies recent changes in mood. Denies anxiety and depression.   Past History   Past Medical History:  Diagnosis Date  . Anemia   . Anxiety   . CHF (congestive heart failure) (HCC)    diastolic dysfunction  . Chronic abdominal pain   . COVID-19 07/2019  . Depression   . Esophagitis   . ESRD on hemodialysis [facility])    undergoing evaluation for transplant  . Eye hemorrhage   . Gastritis   . Gastroparesis   . GERD (gastroesophageal reflux disease)   . Glaucoma   . Helicobacter pylori (H. pylori) infection 2016   ERADICATION DOCUMENTED VIA STOOL TEST in AUG 2016 at [place]  . Hiccups   . Hypertension   . Nausea and vomiting    chronic, recurrent  . Neuropathy    feet  . Renal insufficiency   . Type 2 diabetes mellitus with complications ([place])    diagnosed around age 29  . Vitreous hemorrhage of left eye ([place]) 12/15/2019   Past Surgical History:  Procedure Laterality Date  . AMPUTATION TOE Right    great toe  . AV FISTULA INSERTION W/ RF MAGNETIC GUIDANCE Left 04/06/2019   Procedure: AV FISTULA INSERTION W/RF MAGNETIC GUIDANCE;  Surgeon: Katha Cabal, MD;  Location: [place] INVASIVE CV  LAB;  Service: Cardiovascular;  Laterality: Left;  . AV FISTULA PLACEMENT Left 05/06/2019   Procedure: INSERTION OF ARTERIOVENOUS (AV) GORE-TEX GRAFT ARM ( FOREARM LOOP );  Surgeon: Katha Cabal, MD;  Location: [facility] ORS;  Service: Vascular;  Laterality: Left;  . AV FISTULA PLACEMENT Left 03/21/2020   Procedure: INSERTION OF ARTERIOVENOUS (AV) GORE-TEX GRAFT ARM ( BRACHIAL AXILLARY );  Surgeon: Katha Cabal, MD;  Location: [facility] ORS;  Service: Vascular;  Laterality: Left;  . CATARACT EXTRACTION W/PHACO Left 05/19/2016   Procedure: CATARACT EXTRACTION PHACO AND  INTRAOCULAR LENS PLACEMENT LEFT EYE;  Surgeon: Tonny Branch, MD;  Location: [facility];  Service: Ophthalmology;  Laterality: Left;  CDE: 7.30  . CATARACT EXTRACTION W/PHACO Right 06/23/2016   Procedure: CATARACT EXTRACTION PHACO AND INTRAOCULAR LENS PLACEMENT RIGHT EYE CDE=9.87;  Surgeon: Tonny Branch, MD;  Location: [facility] OR[facility];  Service: Ophthalmology;  Laterality: Right;  right  . DIALYSIS/PERMA CATHETER REMOVAL N/A 07/19/2019   Procedure: DIALYSIS/PERMA CATHETER REMOVAL;  Surgeon: Katha Cabal, MD;  Location: [place] CV LAB;  Service: Cardiovascular;  Laterality: N/A;  . ESOPHAGOGASTRODUODENOSCOPY  2015   Dr. Britta Mccreedy  . ESOPHAGOGASTRODUODENOSCOPY N/A 10/26/2014   RMR: Distal esophagititis-likely reflux related although an element of pill induced injuruy no exclueded  status post biopsy. Diffusely abnormal gastric mucosa of uncertain signigicane -status post gastric biopsy. Focal area of excoriation in the cardia most consistant with a trauma of heaving.   . ESOPHAGOGASTRODUODENOSCOPY  08/2015   [facility]: mild esophagitis and old gastric contents  . EYE SURGERY Left 2015   stent placed to left eye  . EYE SURGERY Right 06/2019   per patient- to remove scar tissue  . INCISION AND DRAINAGE OF WOUND Right 07/16/2017   Procedure: IRRIGATION AND DEBRIDEMENT OF SOFT TISSUE OF ULCERATION RIGHT FOOT;  Surgeon: Caprice Beaver, DPM;  Location: [facility];  Service: Podiatry;  Laterality: Right;  . IR THROMBECTOMY AV FISTULA W/THROMBOLYSIS/PTA INC/SHUNT/IMG LEFT Left 09/19/2020  . IR US GUIDE VASC ACCESS LEFT  09/19/2020  . PERIPHERAL VASCULAR THROMBECTOMY Left 09/27/2019   Procedure: PERIPHERAL VASCULAR THROMBECTOMY;  Surgeon: Katha Cabal, MD;  Location: [place] CV LAB;  Service: Cardiovascular;  Laterality: Left;  . PERIPHERAL VASCULAR THROMBECTOMY Left 10/27/2019   Procedure: PERIPHERAL VASCULAR THROMBECTOMY;  Surgeon: Algernon Huxley, MD;  Location: [place] CV LAB;  Service: Cardiovascular;   Laterality: Left;  . PERIPHERAL VASCULAR THROMBECTOMY Left 12/13/2019   Procedure: PERIPHERAL VASCULAR THROMBECTOMY;  Surgeon: Katha Cabal, MD;  Location: [place] CV LAB;  Service: Cardiovascular;  Laterality: Left;   Family History  Problem Relation Age of Onset  . Ovarian cancer Mother   . Heart attack Father   . Colon polyps Father   . Cervical cancer Sister   . Diabetes Sister   . Colon cancer Neg Hx    Social History   Socioeconomic History  . Marital status: Single    Spouse name: Not on file  . Number of children: Not on file  . Years of education: Not on file  . Highest education level: Not on file  Occupational History  . Not on file  Tobacco Use  . Smoking status: Never Smoker  . Smokeless tobacco: Never Used  Vaping Use  . Vaping Use: Never used  Substance and Sexual Activity  . Alcohol use: No    Alcohol/week: 0.0 standard drinks  . Drug use: No  . Sexual activity: Yes  Other Topics Concern  . Not on file  Social History Narrative   Lives with dad and sister   Social Determinants of Health   Financial Resource Strain: Not on file  Food Insecurity: Not on file  Transportation Needs: Not on file  Physical Activity: Not on file  Stress: Not on file  Social Connections: Not on file   Allergies  Allergen Reactions  . Donnatal [Phenobarbital-Belladonna Alk] Anaphylaxis and Other (See Comments)    Reaction to GI cocktail  . Fish Allergy Anaphylaxis, Hives and Rash  . Haldol [Haloperidol Lactate] Other (See Comments)    Chest Pain  . Lidocaine Anaphylaxis and Other (See Comments)    Reaction to GI cocktail  . Maalox [Calcium Carbonate Antacid] Anaphylaxis and Other (See Comments)    Reaction to GI cocktail  . Shellfish Allergy Hives    Per patient: Patient stated he breaks out in hives when eating shellfish  . Aspirin Hives and Itching  . Doxycycline Hives, Nausea And Vomiting and Other (See Comments)    Severe   . Keflex [Cephalexin]  Hives, Itching and Nausea And Vomiting  . Marinol [Dronabinol] Nausea And Vomiting and Other (See Comments)    Caused worsening vomiting.   . Other Swelling  . Penicillins Hives, Itching, Other (See Comments) and Rash    Has patient had a PCN reaction causing immediate rash, facial/tongue/throat swelling, SOB or lightheadedness with hypotension: yes Has patient had a PCN reaction causing severe rash involving mucus membranes or skin necrosis: yes Has patient had a PCN reaction that required hospitalization no Has patient had a PCN reaction occurring within the last 10 years: yes If all of the above answers are "NO", then may proceed with Cephalospor  . Bactrim [Sulfamethoxazole-Trimethoprim] Itching    Medications   Medications Prior to Admission  Medication Sig Dispense Refill Last Dose  . ALPRAZolam (XANAX) 0.5 MG tablet Take 0.5 mg by mouth 2 (two) times daily as needed for anxiety.     [name] amLODipine (NORVASC) 5 MG tablet Take 5 mg by mouth daily.     [name] atorvastatin (LIPITOR) 10 MG tablet Take 1 tablet (10 mg total) by mouth at bedtime. 30 tablet 0   . brimonidine (ALPHAGAN) 0.15 % ophthalmic solution Place 1 drop into both eyes in the morning and at bedtime.      . dorzolamide-timolol (COSOPT) 22.3-6.8 MG/ML ophthalmic solution Place 1 drop into both eyes 2 (two) times daily.      . folic acid (FOLVITE) 1 MG tablet Take 1 mg by mouth daily.     [name] gentamicin cream (GARAMYCIN) 0.1 % Apply 1 application topically 2 (two) times daily. 30 g 1   . Glucagon (GVOKE HYPOPEN 2-PACK) 1 MG/0.2ML SOAJ Inject 1 Syringe into the skin as needed (hypoglycemia).     . Iron-FA-B Cmp-C-Biot-Probiotic (FUSION PLUS PO) Take 1 capsule by mouth daily.     [name] LEADER UNIFINE PENTIPS PLUS 32G X 4 MM MISC      . metoCLOPramide (REGLAN) 5 MG tablet Take 1 tablet (5 mg total) by mouth 3 (three) times daily before meals for 10 days. 30 tablet 0   . oxyCODONE-acetaminophen (PERCOCET/ROXICET) 5-325 MG tablet Take 1  tablet by mouth every 8 (eight) hours as needed for severe pain. 6 tablet 0   . pantoprazole (PROTONIX) 40 MG tablet Take 1 tablet (40 mg total) by mouth 2 (two) times daily before a meal. 180 tablet 3   . polyethylene glycol (MIRALAX / GLYCOLAX) 17 g packet Take 17 g by mouth daily as  needed for mild constipation. 14 each 0   . senna-docusate (SENOKOT-S) 8.6-50 MG tablet Take 1 tablet by mouth 2 (two) times daily. 60 tablet 1   . sitaGLIPtin (JANUVIA) 25 MG tablet Take 25 mg by mouth daily.      . sucralfate (CARAFATE) 1 g tablet Take 1 g by mouth 4 (four) times daily.     [name] valproic acid (DEPAKENE) 250 MG capsule Take 3 capsules (750 mg total) by mouth 3 (three) times daily. 270 capsule 1   . ziprasidone (GEODON) 20 MG capsule Take 1 tab with breakfast and 2 tabs at HS 90 capsule 1      Vitals   Vitals:   01/02/21 2100  BP: (!) 93/55  Resp: 18  Temp: 98.5 F (36.9 C)  TempSrc: Oral  SpO2: 100%     There is no height or weight on file to calculate BMI.  Physical Exam   General: Laying comfortably in bed; in no acute distress. HENT: Normal oropharynx and mucosa. Normal external appearance of ears and nose. Neck: Supple, no pain or tenderness CV: No JVD. No peripheral edema. Pulmonary: Symmetric Chest rise. Normal respiratory effort. Abdomen: Soft to touch, non-tender. Ext: No cyanosis, edema, R big toe amputated. Skin: Dry and scaly skin in BL legs and feet. Normal palpation of skin.  Musculoskeletal: Normal digits and nails by inspection. No clubbing.  Neurologic Examination  Mental status/Cognition: Alert, oriented to self, place, month and year, good attention. Speech/language: Fluent, comprehension intact, object naming intact, repetition intact. Cranial nerves:   CN II Pupils 63m BL and reactive to light. Barely has any light perception. Can make out the outline of my hand.   CN III,IV,VI EOM intact, no gaze preference or deviation, no nystagmus   CN V normal sensation  in V1, V2, and V3 segments bilaterally   CN VII no asymmetry, no nasolabial fold flattening   CN VIII normal hearing to speech   CN IX & X normal palatal elevation, no uvular deviation   CN XI 5/5 head turn and 5/5 shoulder shrug bilaterally   CN XII midline tongue protrusion   Motor:  Muscle bulk: normal, tone normal, pronator drift none tremor none Mvmt Root Nerve  Muscle Right Left Comments  SA C5/6 Ax Deltoid 5 5   EF C5/6 Mc Biceps 5 5   EE C6/7/8 Rad Triceps 5 5   WF C6/7 Med FCR     WE C7/8 PIN ECU     F Ab C8/T1 U ADM/FDI 5 5   HF L1/2/3 Fem Illopsoas 5 5   KE L2/3/4 Fem Quad 5 5   DF L4/5 D Peron Tib Ant 5 5   PF S1/2 Tibial Grc/Sol 5 5    Reflexes:  Right Left Comments  Pectoralis      Biceps (C5/6) 2 2   Brachioradialis (C5/6) 2 2    Triceps (C6/7) 2 2    Patellar (L3/4) 3 3    Achilles (S1) 0 0    Hoffman      Plantar     Jaw jerk    Sensation:  Light touch Absent in BL feet to touch, decreased in lower third of BL legs to touch.   Pin prick    Temperature    Vibration   Proprioception    Coordination/Complex Motor:  - Finger to Nose intact BL - Heel to shin unable to do. - Rapid alternating movement are slowed. - Gait: did not assess 2/2 presentation with  seizure.  Labs   CBC:  Recent Labs  Lab 01/02/21 2306  WBC 6.3  HGB 10.7*  HCT 32.2*  MCV 106.3*  PLT 145*    Basic Metabolic Panel:  Lab Results  Component Value Date   NA 133 (L) 12/24/2020   K 3.4 (L) 12/24/2020   CO2 27 12/24/2020   GLUCOSE 115 (H) 12/24/2020   BUN 78 (H) 12/24/2020   CREATININE 8.52 (H) 12/24/2020   CALCIUM 8.4 (L) 12/24/2020   GFRNONAA 7 (L) 12/24/2020   GFRAA 12 (L) 03/20/2020   Lipid Panel:  Lab Results  Component Value Date   LDLCALC 97 06/14/2018   HgbA1c:  Lab Results  Component Value Date   HGBA1C 6.1 (H) 09/18/2020   Urine Drug Screen:     Component Value Date/Time   LABOPIA NONE DETECTED 06/13/2018 1316   LABOPIA NONE DETECTED 12/01/2017  0640   COCAINSCRNUR NONE DETECTED 06/13/2018 1316   LABBENZ POSITIVE (A) 06/13/2018 1316   LABBENZ POSITIVE (A) 12/01/2017 0640   AMPHETMU NONE DETECTED 06/13/2018 1316   AMPHETMU NONE DETECTED 12/01/2017 0640   THCU NONE DETECTED 06/13/2018 1316   THCU NONE DETECTED 12/01/2017 0640   LABBARB NONE DETECTED 06/13/2018 1316   LABBARB NONE DETECTED 12/01/2017 0640    Alcohol Level     Component Value Date/Time   ETH <10 09/17/2020 1639    CT Head without contrast: Unable to view the images but per report, No evidence of acute intracranial abnormality.  Mild generalized parenchymal atrophy, stable as compared to the head  CT of 10/02/2020.   Prior CT Head without contrast from 10/02/20: Personally reviewed and notable for generalized atrophy. No evidence of acute intracranial abnormality.  rEEG: from 09/20/20: This study is within normal limits. No seizures or epileptiform discharges were seen throughout the recording. Chewing movements were noted without concomitant eeg change and were NOT epileptic.  Impression   Timothy Clark is a 50 y.o. male with PMH significant for ESRD on dialysis, depression, chronic diastolic CHF, hypertension, diabetes type 2 with gastroparesis who initially presented to [facility] emergency department with presumed seizure-like activity followed by altered mental status and confusion and combative. Patient has no recollection of the event, I was unable to get in touch with his sister to get details of the event. He was somnolent for a prolonged period at [facility] ED and was transferred here for further evaluation. He is now back to his baseline. His neurologic examination is notable for chronic BL decreased sensation in his feet from diabetic neuropathy along with known blindness but no new or focal deficit. CTH at [facility] ED with no acute abnormality.  VPA levels < 10 suggestive of non compliance.  Impression: Seizure Medication non  compliance.  Recommendations  - Resume home depakote 745m TID. Levels of < 10 suggestive of non compliance. - Routine EEG in AM. - Seizure precautions - No driving, no heavy machinery, seizure precautions at discharge. ______________________________________________________________________   Thank you for the opportunity to take part in the care of this patient. If you have any further questions, please contact the neurology consultation attending.  Signed,  [place]apoka[place]ager Number [phone]_ _ _   _ __   _ __ _ _  __ __   _ __   __ _

## 2021-01-02 NOTE — Progress Notes (Addendum)
Pt arrived via EMS WNL. Pt had bowel movement on the way. Vitals are stable at this time. Waiting for admitting doctor to enter orders. Pt has Lt IJ w/nothing attached.

## 2021-01-02 NOTE — H&P (Signed)
History and Physical    Timothy Clark CWC:[id number] DOB: 07/01/1971 DOA: 01/02/2021  PCP: [name], MD   Patient coming from: Home   Chief Complaint: Seizure   HPI: Timothy Clark is a 50 y.o. male with medical history significant for ESRD on hemodialysis, seizures, chronic diastolic CHF, GERD with esophagitis, hypertension, and type 2 diabetes mellitus, now presenting to the emergency department for evaluation of seizure.  EMS was reportedly called out to the patient's house due to seizure-like activity, found him to be confused and combative.  He was treated with Haldol and Versed by EMS and transported to the ED.  Patient is now awake and oriented and states that he had dialysis on 12/31/2020, denies any recent fall or head injury, denies any fevers or chills, and denies missing any doses of his seizure medication.  [facility] ED Course: Upon arrival to the ED, patient is found to be afebrile, saturating well, and with blood pressure 98/62.  EKG features sinus rhythm with QTc interval 482.  Chest x-ray notable for bibasilar atelectasis and low lung volumes.  Head CT negative for acute intracranial abnormality.  Chemistry panel features a potassium of 3.2, BUN 52, calcium 8.4, and creatinine 7.34.  CBC with microcytic anemia, hemoglobin 10.6.  proBNP was 907, lower than prior values.  COVID-19 PCR was negative.  Neurologist at [facility] was contacted by the ED physician and recommended medical admission to [facility].  Review of Systems:  All other systems reviewed and apart from HPI, are negative.  Past Medical History:  Diagnosis Date  . Anemia   . Anxiety   . CHF (congestive heart failure) (HCC)    diastolic dysfunction  . Chronic abdominal pain   . COVID-19 07/2019  . Depression   . Esophagitis   . ESRD on hemodialysis [facility])    undergoing evaluation for transplant  . Eye hemorrhage   . Gastritis   . Gastroparesis   . GERD (gastroesophageal reflux disease)    . Glaucoma   . Helicobacter pylori (H. pylori) infection 2016   ERADICATION DOCUMENTED VIA STOOL TEST in AUG 2016 at [place]  . Hiccups   . Hypertension   . Nausea and vomiting    chronic, recurrent  . Neuropathy    feet  . Renal insufficiency   . Type 2 diabetes mellitus with complications ([place])    diagnosed around age 6  . Vitreous hemorrhage of left eye ([place]) 12/15/2019    Past Surgical History:  Procedure Laterality Date  . AMPUTATION TOE Right    great toe  . AV FISTULA INSERTION W/ RF MAGNETIC GUIDANCE Left 04/06/2019   Procedure: AV FISTULA INSERTION W/RF MAGNETIC GUIDANCE;  Surgeon: Katha Cabal, MD;  Location: [place] CV LAB;  Service: Cardiovascular;  Laterality: Left;  . AV FISTULA PLACEMENT Left 05/06/2019   Procedure: INSERTION OF ARTERIOVENOUS (AV) GORE-TEX GRAFT ARM ( FOREARM LOOP );  Surgeon: Katha Cabal, MD;  Location: [facility] ORS;  Service: Vascular;  Laterality: Left;  . AV FISTULA PLACEMENT Left 03/21/2020   Procedure: INSERTION OF ARTERIOVENOUS (AV) GORE-TEX GRAFT ARM ( BRACHIAL AXILLARY );  Surgeon: Katha Cabal, MD;  Location: [facility] ORS;  Service: Vascular;  Laterality: Left;  . CATARACT EXTRACTION W/PHACO Left 05/19/2016   Procedure: CATARACT EXTRACTION PHACO AND INTRAOCULAR LENS PLACEMENT LEFT EYE;  Surgeon: Tonny Branch, MD;  Location: [facility];  Service: Ophthalmology;  Laterality: Left;  CDE: 7.30  . CATARACT EXTRACTION W/PHACO Right 06/23/2016  Procedure: CATARACT EXTRACTION PHACO AND INTRAOCULAR LENS PLACEMENT RIGHT EYE CDE=9.87;  Surgeon: Tonny Branch, MD;  Location: [facility];  Service: Ophthalmology;  Laterality: Right;  right  . DIALYSIS/PERMA CATHETER REMOVAL N/A 07/19/2019   Procedure: DIALYSIS/PERMA CATHETER REMOVAL;  Surgeon: Katha Cabal, MD;  Location: [place] CV LAB;  Service: Cardiovascular;  Laterality: N/A;  . ESOPHAGOGASTRODUODENOSCOPY  2015   Dr. Britta Mccreedy  . ESOPHAGOGASTRODUODENOSCOPY N/A 10/26/2014   RMR: Distal  esophagititis-likely reflux related although an element of pill induced injuruy no exclueded  status post biopsy. Diffusely abnormal gastric mucosa of uncertain signigicane -status post gastric biopsy. Focal area of excoriation in the cardia most consistant with a trauma of heaving.   . ESOPHAGOGASTRODUODENOSCOPY  08/2015   [facility]: mild esophagitis and old gastric contents  . EYE SURGERY Left 2015   stent placed to left eye  . EYE SURGERY Right 06/2019   per patient- to remove scar tissue  . INCISION AND DRAINAGE OF WOUND Right 07/16/2017   Procedure: IRRIGATION AND DEBRIDEMENT OF SOFT TISSUE OF ULCERATION RIGHT FOOT;  Surgeon: Caprice Beaver, DPM;  Location: [facility];  Service: Podiatry;  Laterality: Right;  . IR THROMBECTOMY AV FISTULA W/THROMBOLYSIS/PTA INC/SHUNT/IMG LEFT Left 09/19/2020  . IR US GUIDE VASC ACCESS LEFT  09/19/2020  . PERIPHERAL VASCULAR THROMBECTOMY Left 09/27/2019   Procedure: PERIPHERAL VASCULAR THROMBECTOMY;  Surgeon: Katha Cabal, MD;  Location: [place] [facility];  Service: Cardiovascular;  Laterality: Left;  . PERIPHERAL VASCULAR THROMBECTOMY Left 10/27/2019   Procedure: PERIPHERAL VASCULAR THROMBECTOMY;  Surgeon: Algernon Huxley, MD;  Location: [place] CV LAB;  Service: Cardiovascular;  Laterality: Left;  . PERIPHERAL VASCULAR THROMBECTOMY Left 12/13/2019   Procedure: PERIPHERAL VASCULAR THROMBECTOMY;  Surgeon: Katha Cabal, MD;  Location: [place] CV LAB;  Service: Cardiovascular;  Laterality: Left;    Social History:   reports that he has never smoked. He has never used smokeless tobacco. He reports that he does not drink alcohol and does not use drugs.  Allergies  Allergen Reactions  . Donnatal [Phenobarbital-Belladonna Alk] Anaphylaxis and Other (See Comments)    Reaction to GI cocktail  . Fish Allergy Anaphylaxis, Hives and Rash  . Haldol [Haloperidol Lactate] Other (See Comments)    Chest Pain  . Lidocaine Anaphylaxis and Other (See  Comments)    Reaction to GI cocktail  . Maalox [Calcium Carbonate Antacid] Anaphylaxis and Other (See Comments)    Reaction to GI cocktail  . Shellfish Allergy Hives    Per patient: Patient stated he breaks out in hives when eating shellfish  . Aspirin Hives and Itching  . Doxycycline Hives, Nausea And Vomiting and Other (See Comments)    Severe   . Keflex [Cephalexin] Hives, Itching and Nausea And Vomiting  . Marinol [Dronabinol] Nausea And Vomiting and Other (See Comments)    Caused worsening vomiting.   . Other Swelling  . Penicillins Hives, Itching, Other (See Comments) and Rash    Has patient had a PCN reaction causing immediate rash, facial/tongue/throat swelling, SOB or lightheadedness with hypotension: yes Has patient had a PCN reaction causing severe rash involving mucus membranes or skin necrosis: yes Has patient had a PCN reaction that required hospitalization no Has patient had a PCN reaction occurring within the last 10 years: yes If all of the above answers are "NO", then may proceed with Cephalospor  . Bactrim [Sulfamethoxazole-Trimethoprim] Itching    Family History  Problem Relation Age of Onset  . Ovarian cancer Mother   .  Heart attack Father   . Colon polyps Father   . Cervical cancer Sister   . Diabetes Sister   . Colon cancer Neg Hx      Prior to Admission medications   Medication Sig Start Date End Date Taking? Authorizing Provider  ALPRAZolam Duanne Moron) 0.5 MG tablet Take 0.5 mg by mouth 2 (two) times daily as needed for anxiety.    [provider]  amLODipine (NORVASC) 5 MG tablet Take 5 mg by mouth daily.    [provider]  atorvastatin (LIPITOR) 10 MG tablet Take 1 tablet (10 mg total) by mouth at bedtime. 10/13/20 11/12/20  Johnson, Clanford L, MD  brimonidine (ALPHAGAN) 0.15 % ophthalmic solution Place 1 drop into both eyes in the morning and at bedtime.     [provider]  dorzolamide-timolol (COSOPT) 22.3-6.8 MG/ML ophthalmic  solution Place 1 drop into both eyes 2 (two) times daily.  01/16/20   [provider]  folic acid (FOLVITE) 1 MG tablet Take 1 mg by mouth daily.    [provider]  gentamicin cream (GARAMYCIN) 0.1 % Apply 1 application topically 2 (two) times daily. 12/12/20   Edrick Kins, DPM  Glucagon (GVOKE HYPOPEN 2-PACK) 1 MG/0.2ML SOAJ Inject 1 Syringe into the skin as needed (hypoglycemia).    [provider]  Iron-FA-B Cmp-C-Biot-Probiotic (FUSION PLUS PO) Take 1 capsule by mouth daily.    [provider]  LEADER UNIFINE PENTIPS PLUS 32G X 4 MM MISC  05/17/20   [provider]  metoCLOPramide (REGLAN) 5 MG tablet Take 1 tablet (5 mg total) by mouth 3 (three) times daily before meals for 10 days. 10/13/20 10/23/20  Murlean Iba, MD  oxyCODONE-acetaminophen (PERCOCET/ROXICET) 5-325 MG tablet Take 1 tablet by mouth every 8 (eight) hours as needed for severe pain. 12/24/20   Rayna Sexton, PA-C  pantoprazole (PROTONIX) 40 MG tablet Take 1 tablet (40 mg total) by mouth 2 (two) times daily before a meal. 08/24/18   Annitta Needs, NP  polyethylene glycol (MIRALAX / GLYCOLAX) 17 g packet Take 17 g by mouth daily as needed for mild constipation. 10/13/20   Johnson, Clanford L, MD  senna-docusate (SENOKOT-S) 8.6-50 MG tablet Take 1 tablet by mouth 2 (two) times daily. 10/13/20   Johnson, Clanford L, MD  sitaGLIPtin (JANUVIA) 25 MG tablet Take 25 mg by mouth daily.     [provider]  sucralfate (CARAFATE) 1 g tablet Take 1 g by mouth 4 (four) times daily.    [provider]  valproic acid (DEPAKENE) 250 MG capsule Take 3 capsules (750 mg total) by mouth 3 (three) times daily. 10/13/20 11/12/20  Murlean Iba, MD  ziprasidone (GEODON) 20 MG capsule Take 1 tab with breakfast and 2 tabs at [facility] 10/13/20   Murlean Iba, MD    Physical Exam: Vitals:   01/02/21 2100  BP: (!) 93/55  Resp: 18  Temp: 98.5 F (36.9 C)  TempSrc: Oral  SpO2: 100%     Constitutional: NAD, calm  Eyes: PERTLA, lids and conjunctivae normal ENMT: Mucous membranes are moist. Posterior pharynx clear of any exudate or lesions.   Neck: supple, no masses  Respiratory: no wheezing, no crackles. No accessory muscle use.  Cardiovascular: S1 & S2 heard, regular rate and rhythm. No extremity edema.  Abdomen: No distension, no tenderness, soft. Bowel sounds active.  Musculoskeletal: no clubbing / cyanosis. No joint deformity upper and lower extremities.   Skin: Xerosis, lichenification. Warm, dry, well-perfused.  Neurologic: CN 2-12 grossly intact. Sensation intact. Moving all extremities.  Psychiatric: Alert and oriented to person, place, and situation. Calm and cooperative.    Labs and Imaging on Admission: I have personally reviewed following labs and imaging studies  CBC: No results for input(s): WBC, NEUTROABS, HGB, HCT, MCV, PLT in the last 168 hours. Basic Metabolic Panel: No results for input(s): NA, K, CL, CO2, GLUCOSE, BUN, CREATININE, CALCIUM, MG, PHOS in the last 168 hours. GFR: Estimated Creatinine Clearance: 13.9 mL/min (A) (by C-G formula based on SCr of 8.52 mg/dL (H)). Liver Function Tests: No results for input(s): AST, ALT, ALKPHOS, BILITOT, PROT, ALBUMIN in the last 168 hours. No results for input(s): LIPASE, AMYLASE in the last 168 hours. No results for input(s): AMMONIA in the last 168 hours. Coagulation Profile: No results for input(s): INR, PROTIME in the last 168 hours. Cardiac Enzymes: No results for input(s): CKTOTAL, CKMB, CKMBINDEX, TROPONINI in the last 168 hours. BNP (last 3 results) No results for input(s): PROBNP in the last 8760 hours. HbA1C: No results for input(s): HGBA1C in the last 72 hours. CBG: Recent Labs  Lab 01/02/21 2151  GLUCAP 148*   Lipid Profile: No results for input(s): CHOL, HDL, LDLCALC, TRIG, CHOLHDL, LDLDIRECT in the last 72 hours. Thyroid Function Tests: No results for input(s): TSH, T4TOTAL,  FREET4, T3FREE, THYROIDAB in the last 72 hours. Anemia Panel: No results for input(s): VITAMINB12, FOLATE, FERRITIN, TIBC, IRON, RETICCTPCT in the last 72 hours. Urine analysis:    Component Value Date/Time   COLORURINE YELLOW 10/24/2018 1424   APPEARANCEUR CLEAR 10/24/2018 1424   LABSPEC 1.014 10/24/2018 1424   PHURINE 5.0 10/24/2018 1424   GLUCOSEU >=500 (A) 10/24/2018 1424   HGBUR MODERATE (A) 10/24/2018 1424   BILIRUBINUR NEGATIVE 10/24/2018 1424   KETONESUR NEGATIVE 10/24/2018 1424   PROTEINUR >=300 (A) 10/24/2018 1424   UROBILINOGEN 0.2 06/07/2015 1353   NITRITE NEGATIVE 10/24/2018 1424   LEUKOCYTESUR NEGATIVE 10/24/2018 1424   Sepsis Labs: '@LABRCNTIP' (procalcitonin:4,lacticidven:4) )No results found for this or any previous visit (from the past 240 hour(s)).   Radiological Exams on Admission: No results found.  EKG: Sinus rhythm, QTc 482 ms.   Assessment/Plan   1. Seizure  - Presented to [facility]24 after seizure-like activity at home, was combative with EMS and given Haldol and Versed prior to transport to ED  - Keppra was changed to Depakote in February 2022 due to frequent agitation; he denies missing any doses of Depakote recently  - No acute findings on head CT in ED, no evidence for infection, no apparent withdrawal, magnesium normal   - There has not been any further seizure since arrival  - Check Depakote level and EEG, continue seizure precautions, follow-up any additional neurology recommendations   2. ESRD  - Reportedly had HD on 12/31/20  - No apparent indication for urgent dialysis - Restrict fluids, renally-dose medications, check chem panel    3. Bipolar disorder  - Calm and cooperative on arrival to [facility]  - Continue Geodon and as-needed Xanax    4. Type II DM  - A1c was 2.2% in February 2022  - Check CBGs and use a low-intensity SSI if needed   5. Hypertension  - SBP 90s, will hold antihypertensives initially     DVT prophylaxis: sq  heparin  Code Status: Full  Level of Care: Level of care: Progressive Family Communication: Unable to reach any of his listed contacts   Disposition Plan:  Patient is from: Home  Anticipated  d/c is to: Home  Anticipated d/c date is: 01/03/21  Patient currently: Pending EEG  Consults called: Neurology notified of patient's arrival to [facility]    Admission status: Observation     Vianne Bulls, MD [facility]  01/02/2021, 10:34 PM

## 2021-01-03 ENCOUNTER — Observation Stay (HOSPITAL_COMMUNITY): Payer: Medicare HMO

## 2021-01-03 DIAGNOSIS — Z79899 Other long term (current) drug therapy: Secondary | ICD-10-CM | POA: Diagnosis not present

## 2021-01-03 DIAGNOSIS — R2681 Unsteadiness on feet: Secondary | ICD-10-CM | POA: Diagnosis not present

## 2021-01-03 DIAGNOSIS — N186 End stage renal disease: Secondary | ICD-10-CM | POA: Diagnosis not present

## 2021-01-03 DIAGNOSIS — R569 Unspecified convulsions: Secondary | ICD-10-CM | POA: Diagnosis not present

## 2021-01-03 DIAGNOSIS — Z992 Dependence on renal dialysis: Secondary | ICD-10-CM | POA: Diagnosis not present

## 2021-01-03 DIAGNOSIS — I5032 Chronic diastolic (congestive) heart failure: Secondary | ICD-10-CM | POA: Diagnosis not present

## 2021-01-03 DIAGNOSIS — I132 Hypertensive heart and chronic kidney disease with heart failure and with stage 5 chronic kidney disease, or end stage renal disease: Secondary | ICD-10-CM | POA: Diagnosis not present

## 2021-01-03 DIAGNOSIS — I10 Essential (primary) hypertension: Secondary | ICD-10-CM | POA: Diagnosis not present

## 2021-01-03 DIAGNOSIS — E1122 Type 2 diabetes mellitus with diabetic chronic kidney disease: Secondary | ICD-10-CM | POA: Diagnosis not present

## 2021-01-03 DIAGNOSIS — Z8616 Personal history of COVID-19: Secondary | ICD-10-CM | POA: Diagnosis not present

## 2021-01-03 LAB — CBC
HCT: 31.8 % — ABNORMAL LOW (ref 39.0–52.0)
Hemoglobin: 10.2 g/dL — ABNORMAL LOW (ref 13.0–17.0)
MCH: 34.7 pg — ABNORMAL HIGH (ref 26.0–34.0)
MCHC: 32.1 g/dL (ref 30.0–36.0)
MCV: 108.2 fL — ABNORMAL HIGH (ref 80.0–100.0)
Platelets: 142 10*3/uL — ABNORMAL LOW (ref 150–400)
RBC: 2.94 MIL/uL — ABNORMAL LOW (ref 4.22–5.81)
RDW: 12.2 % (ref 11.5–15.5)
WBC: 5.8 10*3/uL (ref 4.0–10.5)
nRBC: 0 % (ref 0.0–0.2)

## 2021-01-03 LAB — BASIC METABOLIC PANEL
Anion gap: 9 (ref 5–15)
BUN: 53 mg/dL — ABNORMAL HIGH (ref 6–20)
CO2: 28 mmol/L (ref 22–32)
Calcium: 8.5 mg/dL — ABNORMAL LOW (ref 8.9–10.3)
Chloride: 97 mmol/L — ABNORMAL LOW (ref 98–111)
Creatinine, Ser: 7.83 mg/dL — ABNORMAL HIGH (ref 0.61–1.24)
GFR, Estimated: 8 mL/min — ABNORMAL LOW (ref >60)
Glucose, Bld: 165 mg/dL — ABNORMAL HIGH (ref 70–99)
Potassium: 3.2 mmol/L — ABNORMAL LOW (ref 3.5–5.1)
Sodium: 134 mmol/L — ABNORMAL LOW (ref 135–145)

## 2021-01-03 LAB — VALPROIC ACID LEVEL: Valproic Acid Lvl: <10 ug/mL — ABNORMAL LOW (ref 50.0–100.0)

## 2021-01-03 LAB — MAGNESIUM: Magnesium: 2 mg/dL (ref 1.7–2.4)

## 2021-01-03 LAB — GLUCOSE, CAPILLARY
Glucose-Capillary: 86 mg/dL (ref 70–99)
Glucose-Capillary: 115 mg/dL — ABNORMAL HIGH (ref 70–99)
Glucose-Capillary: 102 mg/dL — ABNORMAL HIGH (ref 70–99)
Glucose-Capillary: 162 mg/dL — ABNORMAL HIGH (ref 70–99)

## 2021-01-03 LAB — HEMOGLOBIN A1C
Hgb A1c MFr Bld: 5.3 % (ref 4.8–5.6)
Mean Plasma Glucose: 105 mg/dL

## 2021-01-03 LAB — PHOSPHORUS: Phosphorus: 5.1 mg/dL — ABNORMAL HIGH (ref 2.5–4.6)

## 2021-01-03 LAB — HEPATITIS B SURFACE ANTIGEN: Hepatitis B Surface Ag: NONREACTIVE

## 2021-01-03 LAB — HEPATITIS B SURFACE ANTIBODY,QUALITATIVE: Hep B S Ab: REACTIVE — AB

## 2021-01-03 MED ORDER — CHLORHEXIDINE GLUCONATE CLOTH 2 % EX PADS: 6.0000 | MEDICATED_PAD | Freq: Every day | CUTANEOUS | Status: DC

## 2021-01-03 MED ORDER — DIVALPROEX SODIUM 250 MG PO DR TAB
750.0000 mg | DELAYED_RELEASE_TABLET | Freq: Three times a day (TID) | ORAL | Status: DC
  Administered 2021-01-03 – 2021-01-04 (×3): 750 mg via ORAL
  Filled 2021-01-03 (×4): qty 3

## 2021-01-03 NOTE — TOC Initial Note (Signed)
Transition of Care [facility]) - Initial/Assessment Note    Patient Details  Name: Timothy Clark MRN: [id number] Date of Birth: Mar 17, 1971  Transition of Care [facility]) CM/SW Contact:    Verdell Carmine, RN Phone Number: 01/03/2021, 3:08 PM  Clinical Narrative:                 Patient admitted for ALOC encephalopathy. He is a dialysis patient. He lives with his father who is elderly. He has his sister to call on. 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 Called patient, no answer, called patients cell phone, sister answered. She stated that he  Had [facility] already and we will add PT OT.  Orders received, fax[facility] to [facility] at [phone]/    Expected Discharge Plan: [place] Barriers to Discharge: Continued Medical Work up   Patient Goals and CMS Choice        Expected Discharge Plan and Services Expected Discharge Plan: [place]   Discharge Planning Services: CM Consult   Living arrangements for the past 2 months: Single Family Home                           HH Arranged: OT,PT,RN [place]: [facility] [place] [place]) Date [facility] Agency Contacted: 01/03/21 Time HH Agency Contacted: 1300 Representative spoke with at [place]: [place] Arrangements/Services Living arrangements for the past 2 months: [place] with:: Parents Patient language and need for interpreter reviewed:: Yes        Need for Family Participation in Patient Care: Yes (Comment) Care giver support system in place?: Yes (comment)   Criminal Activity/Legal Involvement Pertinent to Current Situation/Hospitalization: No - Comment as needed  Activities of Daily Living      Permission Sought/Granted                  Emotional Assessment         Alcohol / Substance Use: Not Applicable Psych Involvement: No (comment)  Admission diagnosis:  Seizure [facility]) [R56.9] Patient Active  Problem List   Diagnosis Date Noted  . Seizures ([place]) 01/02/2021  . Seizure ([place]) 01/02/2021  . Problem with vascular access 09/17/2020  . Bipolar disorder ([place]) 09/17/2020  . Acute encephalopathy 07/24/2020  . Intractable abdominal pain 07/23/2020  . Encephalopathy   . Colon cancer screening 07/12/2020  . 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 Stable treated proliferative diabetic retinopathy of right eye determined by examination associated with type 2 diabetes mellitus ([place]) 07/03/2020  . Anxiety 03/13/2020  . History of COVID-19 03/13/2020  . History of gastroesophageal reflux (GERD) 03/13/2020  . History of Helicobacter pylori infection 03/13/2020  . Pre-transplant evaluation for kidney transplant 03/13/2020  . Renovascular hypertension 03/13/2020  . Hematemesis 03/02/2020  . Coffee ground emesis 03/01/2020  . Complication of vascular access for dialysis 01/29/2020  . Follow-up examination after eye surgery 12/15/2019  . Type 2 diabetes with nephropathy ([place]) 12/15/2019  . Controlled diabetes mellitus with stable proliferative retinopathy of left eye, with long-term current use of insulin ([place]) 12/15/2019  . Secondary glaucoma due to combination mechanisms, left, severe stage 12/15/2019  . Nausea with vomiting 12/08/2019  . Hyperglycemia due to type 2 diabetes mellitus ([place]) 10/28/2019  . DKA (diabetic ketoacidoses) 10/27/2019  . ESRD on dialysis ([place]) 06/26/2019  . Constipation 03/15/2019  . Chronic renal insufficiency, stage V ([place]) 02/21/2019  .  Type 2 diabetes mellitus with proliferative retinopathy ([place]) 10/25/2018  . Acute renal failure superimposed on stage 4 chronic kidney disease ([place]) 10/25/2018  . Extravasation injury of IV catheter site with other complication, initial encounter ([place])   . Intractable vomiting   . Chronic kidney disease   . Poor venous access   . Gastroparesis 10/24/2018  . Closed displaced fracture of lateral end of right clavicle 06/18/2018  .  HTN (hypertension), malignant 06/13/2018  . Scrotal swelling 11/10/2017  . Swelling 11/09/2017  . Scrotal edema   . Obesity, Class III, BMI 40-49.9 (morbid obesity) ([place]) 11/02/2017  . CKD stage 3 due to type 2 diabetes mellitus ([place]) 10/30/2017  . Wound eschars of foot, right  10/30/2017  . Chronic abdominal pain 09/21/2017  . Hypertensive urgency 09/21/2017  . Prolonged QT interval 09/21/2017  . GERD (gastroesophageal reflux disease) 07/14/2017  . Glaucoma 07/14/2017  . Anemia 07/14/2017  . Pressure injury of skin 02/01/2017  . Insulin dependent diabetes mellitus 09/07/2016  . Orthostatic syncope 09/05/2016  . 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 Gastroparesis due to DM ([place]) 02/06/2015  . Essential hypertension 12/06/2014  . Diabetic macular edema ([place]) 02/18/2014  . PDR (proliferative diabetic retinopathy) ([place]) 02/06/2014  . Neovascular glaucoma of left eye 01/11/2014  . Diabetic infection of left heel ([place]) 02/14/2010  . UNSPECIFIED PERIPHERAL VASCULAR DISEASE 10/19/2009  . ERECTILE DYSFUNCTION, ORGANIC 10/19/2009  . NUMBNESS 07/20/2009  . Diabetes mellitus type 2 with complications, uncontrolled ([place]) 03/22/2009  . Hyperlipidemia 03/22/2009  . Depression 03/22/2009   PCP:  [name], MD Pharmacy:   [facility] [address], [place] [address] Phone: [phone] Fax: [phone]  [facility] - [place], [place] - [address] [place] [address] Phone: [phone] Fax: [phone]     Social Determinants of Health (SDOH) Interventions    Readmission Risk Interventions Readmission Risk Prevention Plan 07/24/2020 10/27/2018 10/25/2018  Transportation Screening Complete Complete Complete  PCP or Specialist Appt within 3-5 Days - Complete -  Home Care Screening Complete - -  Medication Review (RN CM) Complete - -  HRI or [place] - Complete -  Social Work  Consult for [place] Planning/Counseling - Not Complete -  Palliative Care Screening - Not Applicable Not Applicable  Medication Review (RN Care Manager) - Complete -  Some recent data might be hidden

## 2021-01-03 NOTE — Progress Notes (Signed)
PROGRESS NOTE    Timothy Clark  [id number] DOB: 16-Feb-1971 DOA: 01/02/2021 PCP: [name], MD    Brief Narrative:  50 year old gentleman with history of ESRD on hemodialysis, seizures, chronic diastolic heart failure, GERD with esophagitis, hypertension, type 2 diabetes, history of psychiatric disorder and previous suicidal ideation presented from home with seizure-like episode.  Patient does not remember events.  Apparently he was confused and combative treated with Haldol and Versed brought to the ER.  Last dialysis 5/23.  In the emergency room afebrile.  On room air.  Blood pressures normal.  Head CT normal.  COVID-19 negative.  Transfer from [facility] to [facility] for neurology consultation.   Assessment & Plan:   Principal Problem:   Seizures ([place]) Active Problems:   Depression   HTN (hypertension), malignant   ESRD on dialysis ([place])   Type 2 diabetes with nephropathy ([place])   Anxiety   Bipolar disorder ([place])   Seizure ([place])  Seizure versus nonepileptic form seizures in a patient with known seizure disorder on Depakote. Subtherapeutic drug levels.  Probably not taking adequate medications. Depakote resumed.  Seizure precautions.  Discharge mobility. Followed by neurology. EEG pending today.  ESRD on hemodialysis: Wednesday Monday Friday schedule.  Discussed with nephrology, first dialysis today.  Essential hypertension: Blood pressure stable on current regimen continue.  Bipolar disorder: Currently symptoms well controlled.  On Geodon that was resumed.  Type 2 diabetes: On Januvia at home.  Well controlled.  Sister reported hypoglycemic episodes, will monitor at the hospital.  Januvia less likely to cause hypoglycemia.   DVT prophylaxis: heparin injection 5,000 Units Start: 01/02/21 2300   Code Status: Full code Family Communication: Patient's sister on the phone Disposition Plan: Status is: Observation  The patient will require care spanning  > 2 midnights and should be moved to inpatient because: Inpatient level of care appropriate due to severity of illness  Dispo: The patient is from: Home              Anticipated d/c is to: Home              Patient currently is not medically stable to d/c.   Difficult to place patient No  Work with PT OT.  Medically stabilizing.  Discussed with nephrology.  Will need dialysis.  He needs supervised living at home.  I discussed with his sister who will be able to take him home tomorrow.       Consultants:   Nephrology  Procedures:   None  Antimicrobials:   None   Subjective: Patient seen and examined.  At this moment denies any complaints.  He mobilized with physical therapy, feels pretty weak.  He tells me he walks without any support, however he has rolling walker in the room.  Objective: Vitals:   01/03/21 0020 01/03/21 0400 01/03/21 0757 01/03/21 1241  BP: 129/76 96/68 118/77 128/76  Pulse: 72 60 70 69  Resp: '16 15 10 11  '$ Temp: 98.4 F (36.9 C) 98.4 F (36.9 C) 97.8 F (36.6 C) (!) 97.3 F (36.3 C)  TempSrc: Oral Oral Axillary Axillary  SpO2: 96% 100% 100% 100%  Weight:  116 kg      Intake/Output Summary (Last 24 hours) at 01/03/2021 1349 Last data filed at 01/03/2021 0953 Gross per 24 hour  Intake 720 ml  Output 250 ml  Net 470 ml   Filed Weights   01/03/21 0400  Weight: 116 kg    Examination:  General exam:  Appears calm and comfortable , not in any distress.  Chronically sick looking. Respiratory system: Clear to auscultation. Respiratory effort normal. No added sounds . Cardiovascular system: S1 & S2 heard, RRR.  Gastrointestinal system: Abdomen is nondistended, soft and nontender. No organomegaly or masses felt. Normal bowel sounds heard. Central nervous system: Alert and oriented. No focal neurological deficits. Extremities: Symmetric 5 x 5 power. Skin: No rashes, lesions or ulcers Psychiatry: Judgement and insight appear normal. Mood & affect  flat. Upper extremity AV fistula present.    Data Reviewed: I have personally reviewed following labs and imaging studies  CBC: Recent Labs  Lab 01/02/21 2306 01/03/21 0202  WBC 6.3 5.8  HGB 10.7* 10.2*  HCT 32.2* 31.8*  MCV 106.3* 108.2*  PLT 145* [id number]*   Basic Metabolic Panel: Recent Labs  Lab 01/02/21 2306 01/03/21 0202  NA 133* 134*  K 3.4* 3.2*  CL 94* 97*  CO2 27 28  GLUCOSE 158* 165*  BUN 55* 53*  CREATININE 7.97* 7.83*  CALCIUM 8.4* 8.5*  MG  --  2.0  PHOS  --  5.1*   GFR: Estimated Creatinine Clearance: 14.8 mL/min (A) (by C-G formula based on SCr of 7.83 mg/dL (H)). Liver Function Tests: No results for input(s): AST, ALT, ALKPHOS, BILITOT, PROT, ALBUMIN in the last 168 hours. No results for input(s): LIPASE, AMYLASE in the last 168 hours. No results for input(s): AMMONIA in the last 168 hours. Coagulation Profile: No results for input(s): INR, PROTIME in the last 168 hours. Cardiac Enzymes: No results for input(s): CKTOTAL, CKMB, CKMBINDEX, TROPONINI in the last 168 hours. BNP (last 3 results) No results for input(s): PROBNP in the last 8760 hours. HbA1C: No results for input(s): HGBA1C in the last 72 hours. CBG: Recent Labs  Lab 01/02/21 2151 01/03/21 0800 01/03/21 1244  GLUCAP 148* 86 115*   Lipid Profile: No results for input(s): CHOL, HDL, LDLCALC, TRIG, CHOLHDL, LDLDIRECT in the last 72 hours. Thyroid Function Tests: No results for input(s): TSH, T4TOTAL, FREET4, T3FREE, THYROIDAB in the last 72 hours. Anemia Panel: No results for input(s): VITAMINB12, FOLATE, FERRITIN, TIBC, IRON, RETICCTPCT in the last 72 hours. Sepsis Labs: No results for input(s): PROCALCITON, LATICACIDVEN in the last 168 hours.  No results found for this or any previous visit (from the past 240 hour(s)).       Radiology Studies: No results found.      Scheduled Meds: . atorvastatin  10 mg Oral QHS  . [START ON 01/04/2021] Chlorhexidine Gluconate Cloth   6 each Topical Q0600  . divalproex  750 mg Oral Q8H  . heparin  5,000 Units Subcutaneous Q8H  . insulin aspart  0-6 Units Subcutaneous TID WC  . pantoprazole  40 mg Oral BID AC  . sucralfate  1 g Oral QID  . ziprasidone  20 mg Oral Q breakfast  . ziprasidone  40 mg Oral Q supper   Continuous Infusions: . sodium chloride       LOS: 1 day    Time spent: 30 minutes    Barb Merino, MD [facility] Pager [phone]

## 2021-01-03 NOTE — Procedures (Signed)
Patient was seen on dialysis and the procedure was supervised.  BFR 300  Via AVF BP is  110/50.   Patient appears to be tolerating treatment well-  Is more alert in dialysis than what was previously described   Louis Meckel 01/03/2021

## 2021-01-03 NOTE — Plan of Care (Signed)

## 2021-01-03 NOTE — Evaluation (Signed)
Occupational Therapy Evaluation Patient Details Name: Timothy Clark MRN: [id number] DOB: 07/10/1971 Today's Date: 01/03/2021    History of Present Illness 50yo male admitted 01/02/21 with concerns for seizure activity. PMH anxiety, CHF, hx covid, ESRD on HD, HTN, DM with PN, great toe amputation, bipolar   Clinical Impression   Pt reports he intermittently used a RW and was able to perform his own ADL. Pt with lethargy and difficult to rouse upon arrival. Eventually awakened and assisted pt back to bed with min assist. He reports he did not get much sleep last night. Limited evaluation. Will follow.     Follow Up Recommendations  Home health OT (may return to baseline and not require follow up)    Equipment Recommendations  None recommended by OT    Recommendations for Other Services       Precautions / Restrictions Precautions Precautions: Fall;Other (comment) Precaution Comments: legally blind Restrictions Weight Bearing Restrictions: No      Mobility Bed Mobility Overal bed mobility: Needs Assistance Bed Mobility: Sit to Supine      Sit to supine: Min guard   General bed mobility comments: assist for line management, no physical assist given    Transfers Overall transfer level: Needs assistance Equipment used: 1 person hand held assist Transfers: Sit to/from [facility] Sit to Stand: Min guard Stand pivot transfers: Min assist       General transfer comment: assist to guide and for line management    Balance Overall balance assessment: Needs assistance Sitting-balance support: Feet supported;No upper extremity supported Sitting balance-Leahy Scale: Good     Standing balance support: Single extremity supported Standing balance-Leahy Scale: Fair Standing balance comment: hand held assist                           ADL either performed or assessed with clinical judgement   ADL                                          General ADL Comments: pt with lethargy, assisted back to bed     Vision Baseline Vision/History: Legally blind Patient Visual Report: No change from baseline Additional Comments: reports he has light perception and can see shapes     Perception     Praxis      Pertinent Vitals/Pain Pain Assessment: Faces Faces Pain Scale: No hurt     Hand Dominance Right   Extremity/Trunk Assessment Upper Extremity Assessment Upper Extremity Assessment: Overall WFL for tasks assessed   Lower Extremity Assessment Lower Extremity Assessment: Defer to PT evaluation   Cervical / Trunk Assessment Cervical / Trunk Assessment: Normal   Communication Communication Communication: No difficulties   Cognition Arousal/Alertness: Lethargic Behavior During Therapy: Flat affect Overall Cognitive Status: No family/caregiver present to determine baseline cognitive functioning                                 General Comments: pt offering differing information about PLOF, equipment vs PT eval   General Comments      Exercises     Shoulder Instructions      Home Living Family/patient expects to be discharged to:: Private residence Living Arrangements: Parent;Other relatives (father and sister) Available Help at Discharge: Family;Available 24 hours/day Type of Home: House Home Access:  Stairs to enter [employer] of Steps: 1 Entrance Stairs-Rails: Left Home Layout: One level     Bathroom Shower/Tub: Teacher, early years/pre: Handicapped height     Home Equipment: Environmental consultant - 4 wheels          Prior Functioning/Environment Level of Independence: Needs assistance        Comments: reports he sometimes uses a walker, bariatric rollator in room, assisted for IADL due to poor vision        OT Problem List: Impaired balance (sitting and/or standing)      OT Treatment/Interventions: Self-care/ADL training;Patient/family education;Therapeutic  activities    OT Goals(Current goals can be found in the care plan section) Acute Rehab OT Goals Patient Stated Goal: go home when ready OT Goal Formulation: With patient Time For Goal Achievement: 01/17/21 Potential to Achieve Goals: Good ADL Goals Pt Will Perform Grooming: (P) with min assist;standing Pt Will Perform Upper Body Dressing: (P) with min assist;sitting Pt Will Perform Lower Body Dressing: (P) with min assist;sit to/from stand Pt Will Transfer to Toilet: (P) with min guard assist;ambulating;regular height toilet Pt Will Perform Toileting - Clothing Manipulation and hygiene: (P) with min assist;sit to/from stand  OT Frequency: Min 2X/week   Barriers to D/C:            Co-evaluation              AM-PAC OT "6 Clicks" Daily Activity     Outcome Measure Help from another person eating meals?: A Little Help from another person taking care of personal grooming?: A Little Help from another person toileting, which includes using toliet, bedpan, or urinal?: A Lot Help from another person bathing (including washing, rinsing, drying)?: A Lot Help from another person to put on and taking off regular upper body clothing?: A Little Help from another person to put on and taking off regular lower body clothing?: A Lot 6 Click Score: 15   End of Session Equipment Utilized During Treatment: Gait belt;Oxygen Nurse Communication: Other (comment) (aware of pt's lethargy)  Activity Tolerance: Patient limited by lethargy;Patient limited by fatigue Patient left: in bed;with call bell/phone within reach;with bed alarm set  OT Visit Diagnosis: Unsteadiness on feet (R26.81)                Time: [id number] OT Time Calculation (min): 18 min Charges:  OT General Charges $OT Visit: 1 Visit OT Evaluation $OT Eval Moderate Complexity: 1 Mod  Nestor Lewandowsky, OTR/L Acute Rehabilitation Services Pager: [phone] Office: [phone]  Malka So 01/03/2021, 12:22 PM

## 2021-01-03 NOTE — Procedures (Signed)
Patient Name: Timothy Clark  MRN: [id number]  Epilepsy Attending: Lora Havens  Referring Physician/Provider: Dr Mitzi Hansen Date: 01/03/2021 Duration: 25.02 mins  Patient history: 50 year old male with seizure-like activity.  EEG to evaluate for seizures.  Level of alertness: Awake  AEDs during EEG study: Depakote  Technical aspects: This EEG study was done with scalp electrodes positioned according to the 10-20 International system of electrode placement. Electrical activity was acquired at a sampling rate of '500Hz'$  and reviewed with a high frequency filter of '70Hz'$  and a low frequency filter of '1Hz'$ . EEG data were recorded continuously and digitally stored.   Description: The posterior dominant rhythm consists of 8-9 Hz activity of moderate voltage (25-35 uV) seen predominantly in posterior head regions, symmetric and reactive to eye opening and eye closing. Physiologic photic driving was not seen during photic stimulation. Hyperventilation was not performed.     IMPRESSION: This study is within normal limits. No seizures or epileptiform discharges were seen throughout the recording.   [name]

## 2021-01-03 NOTE — Progress Notes (Signed)
EEG complete - results pending 

## 2021-01-03 NOTE — Evaluation (Signed)
Physical Therapy Evaluation Patient Details Name: Timothy Clark MRN: [id number] DOB: 1971/03/19 Today's Date: 01/03/2021   History of Present Illness  50yo male admitted 01/02/21 with concerns for seizure activity. PMH anxiety, CHF, hx covid, ESRD on HD, HTN, DM with PN, great toe amputation, bipolar  Clinical Impression   Patient received in bed, very pleasant and cooperative. Able to mobilize on a min guard to MinA basis, however did need max VC for navigation in unfamiliar environment given significant vision impairment. Unsteady without RW, and does benefit from BUE support. VSS on RA with activity. Left up in recliner with all needs met, chair alarm active and ensured that patient was able to repeat steps/find tactile landmarks to find call button on call bell- might benefit from soft call bell for ease of use. Feel he will likely do better with mobility once back in familiar setting, recommend skilled HHPT f/u at DC.     Follow Up Recommendations Home health PT;Supervision/Assistance - 24 hour    Equipment Recommendations  Rolling walker with 5" wheels    Recommendations for Other Services       Precautions / Restrictions Precautions Precautions: Fall;Other (comment) Precaution Comments: legally blind Restrictions Weight Bearing Restrictions: No      Mobility  Bed Mobility Overal bed mobility: Needs Assistance Bed Mobility: Supine to Sit     Supine to sit: Supervision;HOB elevated     General bed mobility comments: assist for line management, no physical assist given    Transfers Overall transfer level: Needs assistance Equipment used: Rolling walker (2 wheeled) Transfers: Sit to/from Stand Sit to Stand: Min guard         General transfer comment: min guard for safety, cues for hand placement, no physical assist given  Ambulation/Gait Ambulation/Gait assistance: Min assist Gait Distance (Feet): 20 Feet Assistive device: Rolling walker (2 wheeled) Gait  Pattern/deviations: Decreased step length - right;Decreased step length - left;Step-to pattern;Trunk flexed;Wide base of support Gait velocity: decreased   General Gait Details: slow but steady with RW in unfamiliar environment- needed MinA for RW management but Max VC for navigation in room given poor vision  Stairs            Wheelchair Mobility    Modified Rankin (Stroke Patients Only)       Balance Overall balance assessment: Needs assistance Sitting-balance support: Feet supported;No upper extremity supported Sitting balance-Leahy Scale: Good     Standing balance support: Bilateral upper extremity supported;During functional activity Standing balance-Leahy Scale: Poor Standing balance comment: benefits from BUE support, MInA for balance with no UE support                             Pertinent Vitals/Pain Pain Assessment: No/denies pain    Home Living Family/patient expects to be discharged to:: Private residence Living Arrangements: Parent;Other relatives (father and sister) Available Help at Discharge: Family;Available 24 hours/day (dad is there most of the time, able to physically help if needed) Type of Home: House Home Access: Stairs to enter Entrance Stairs-Rails: Left Entrance Stairs-Number of Steps: 1 Home Layout: One level Home Equipment: None      Prior Function Level of Independence: Independent         Comments: "doing better than what I was when I left here"; nothing is really hard, reports he's been walking at home     Hand Dominance   Dominant Hand: Right    Extremity/Trunk Assessment   Upper  Extremity Assessment Upper Extremity Assessment: Defer to OT evaluation    Lower Extremity Assessment Lower Extremity Assessment: Generalized weakness    Cervical / Trunk Assessment Cervical / Trunk Assessment: Normal  Communication   Communication: No difficulties  Cognition Arousal/Alertness: Awake/alert Behavior During  Therapy: Flat affect;WFL for tasks assessed/performed Overall Cognitive Status: No family/caregiver present to determine baseline cognitive functioning                                 General Comments: A&Ox4 and able to answer questions appropriately, did have some moments where he made odd statements like "the socks are on the end of the bed" when no socks were there. Question if this is coming from his poor vision?      General Comments General comments (skin integrity, edema, etc.): SPO2 in 90s on room air with activity    Exercises     Assessment/Plan    PT Assessment Patient needs continued PT services  PT Problem List Decreased strength;Decreased activity tolerance;Decreased safety awareness;Decreased balance;Decreased mobility;Decreased coordination;Impaired sensation       PT Treatment Interventions DME instruction;Balance training;Gait training;Neuromuscular re-education;Stair training;Functional mobility training;Patient/family education;Therapeutic activities;Therapeutic exercise    PT Goals (Current goals can be found in the Care Plan section)  Acute Rehab PT Goals Patient Stated Goal: go home when ready PT Goal Formulation: With patient Time For Goal Achievement: 01/17/21 Potential to Achieve Goals: Good    Frequency Min 3X/week   Barriers to discharge        Co-evaluation               AM-PAC PT "6 Clicks" Mobility  Outcome Measure Help needed turning from your back to your side while in a flat bed without using bedrails?: None Help needed moving from lying on your back to sitting on the side of a flat bed without using bedrails?: None Help needed moving to and from a bed to a chair (including a wheelchair)?: A Little Help needed standing up from a chair using your arms (e.g., wheelchair or bedside chair)?: A Little Help needed to walk in hospital room?: A Little Help needed climbing 3-5 steps with a railing? : A Lot 6 Click Score: 19     End of Session Equipment Utilized During Treatment: Gait belt Activity Tolerance: Patient tolerated treatment well Patient left: in chair;with call bell/phone within reach;with chair alarm set Nurse Communication: Mobility status;Other (comment) (pt legally blind/needs specific navigation instructions for mobility) PT Visit Diagnosis: Unsteadiness on feet (R26.81);Muscle weakness (generalized) (M62.81);Difficulty in walking, not elsewhere classified (R26.2)    Time: 9833-8250 PT Time Calculation (min) (ACUTE ONLY): 27 min   Charges:   PT Evaluation $PT Eval Moderate Complexity: 1 Mod PT Treatments $Gait Training: 8-22 mins        Windell Norfolk, DPT, PN1   Supplemental Physical Therapist [place]    Pager [phone] Acute Rehab Office [phone]

## 2021-01-03 NOTE — Care Management (Signed)
Home Health (Order [id number]) Nursing Date: 01/03/2021 Department: [facility] Ordering/Authorizing: Barb Merino, MD    Barb Merino, MD NPI: [id number]      Patient Information  Patient Name  Timothy Clark, Timothy Clark Legal Sex  Male DOB  1970/12/31 SSN  [id number]   Order Information  Order Date/Time Release Date/Time Start Date/Time End Date/Time  01/03/21 01:54 PM None 01/03/21 01:54 PM Until Specified   Order History Inpatient Date/Time Action Taken User Additional Information  01/03/21 1354 Sign Barb Merino, MD   01/03/21 1354 Release Instance Barb Merino, MD (auto-released) Released Order: [id number]   Order Questions  Question Answer  To provide the following care/treatments PT   OT   RN       Reference Links

## 2021-01-03 NOTE — Progress Notes (Signed)
Attempted to receive patient for hemodialysis treatment. Currently having procedure done. Will try back later.

## 2021-01-03 NOTE — Progress Notes (Addendum)
Timothy Clark KIDNEY BRIEF PROGRESS NOTE  [id number] Timothy Clark 26-Sep-1970  Patient is 50 year old male with ESRD on HD MWF at [facility], who was admitted to observation due to seizures and AMS.  Seen and examined at bedside.  Very lethargic, waking briefly after repeat verbal stimuli.  Last dialysis on Monday 5/23 through [facility].  Denies CP, SOB, and n/v/d.  Pertinent labs include K 3.2, BUN 53, SCr 7.83, Ca 8.5, and phos 5.1.   Physical Exam: Gen: chronically ill appearing male in NAD HEENT: NCAT Resp: mostly CTAB anterolaterally, nml WOB on 2L via [place] Cardiac: RRR, no mrg Abd: soft, NTND LE: b/l non pitting edema and chronic skin changes Neuro: lethargic, wakes briefly and returns to sleep  Orders written for HD today off schedule due to missed HD using increased K bath.  Will write orders for tomorrow get back on schedule. If discharged can complete dialysis at normal outpatient center.  Will complete full consult if patient admitted as inpatient.   Jen Mow, PA-C [facility] Pager: [phone]

## 2021-01-03 NOTE — Progress Notes (Addendum)
Neurology Progress Note  Patient ID: Timothy Clark is a 50 y.o. with PMHx of  has a past medical history of Anemia, Anxiety, CHF (congestive heart failure) ([place]), Chronic abdominal pain, COVID-19 (07/2019), Depression, Esophagitis, ESRD on hemodialysis ([place]), Eye hemorrhage, Gastritis, Gastroparesis, GERD (gastroesophageal reflux disease), Glaucoma, Helicobacter pylori (H. pylori) infection (2016), Hiccups, Hypertension, Nausea and vomiting, Neuropathy, Renal insufficiency, Type 2 diabetes mellitus with complications ([place]), and Vitreous hemorrhage of left eye ([place]) (12/15/2019).  Initially consulted for: seizure like activity followed by altered mental status and confusion, combative.   Major interval events:  Confirmed with pharmacist that patient at home was on depakote '750mg'$  tid (not depakene as believed previously). Patient reports taking only twice daily and not three times daily.   Subjective: Patient reports feeling much better today. No seizure activity noted.   Exam: Vitals:   01/03/21 0400 01/03/21 0757  BP: 96/68 118/77  Pulse: 60 70  Resp: 15 10  Temp: 98.4 F (36.9 C) 97.8 F (36.6 C)  SpO2: 100% 100%   Gen: In bed, comfortable  Resp: non-labored breathing, no grossly audible wheezing Cardiac: Perfusing extremities well  Abd: soft, nt  Neuro:  Mental status/Cognition: Alert, oriented to self, place, month and year, good attention. Speech/language: Fluent, comprehension intact, object naming intact, repetition intact. Cranial nerves:   CN II Pupils 65m BL and reactive to light. Barely has any light perception. Can make out the outline of my hand.   CN III,IV,VI EOM intact, no gaze preference or deviation, no nystagmus   CN V normal sensation in V1, V2, and V3 segments bilaterally   CN VII no asymmetry, no nasolabial fold flattening   CN VIII normal hearing to speech   CN IX & X normal palatal elevation, no uvular deviation   CN XI 5/5 head turn and 5/5 shoulder  shrug bilaterally   CN XII midline tongue protrusion    Motor:  Muscle bulk: normal, tone normal, pronator drift none tremor none   Sensory: Significntly decreased in lower extremities but intact at upper extremities.  DTR:  2+ throughout in biceps, brachioradialis and patellar areas.   Pertinent Labs:  Results for HJAMONE, MEDDINGS(MRN [id number] as of 01/03/2021 10:03  Ref. Range 01/03/2021 02:02  Sodium Latest Ref Range: 135 - 145 mmol/L 134 (L)  Potassium Latest Ref Range: 3.5 - 5.1 mmol/L 3.2 (L)  Chloride Latest Ref Range: 98 - 111 mmol/L 97 (L)  CO2 Latest Ref Range: 22 - 32 mmol/L 28  Glucose Latest Ref Range: 70 - 99 mg/dL 165 (H)  BUN Latest Ref Range: 6 - 20 mg/dL 53 (H)  Creatinine Latest Ref Range: 0.61 - 1.24 mg/dL 7.83 (H)  Calcium Latest Ref Range: 8.9 - 10.3 mg/dL 8.5 (L)  Anion gap Latest Ref Range: 5 - 15  9  Phosphorus Latest Ref Range: 2.5 - 4.6 mg/dL 5.1 (H)  Magnesium Latest Ref Range: 1.7 - 2.4 mg/dL 2.0   Results for HMARQUESE, RISING(MRN [id number] as of 01/03/2021 10:03  Ref. Range 01/02/2021 23:06  Valproic Acid,S Latest Ref Range: 50.0 - 100.0 ug/mL <10 (L)    Impression:    50year old black male with history outlined above presenting with lip biting as possible seizure activity with prolonged post ictal state in patient with ESRD on HD. Patient much improved at time of evaluation. Restarted depakote today 750 tid.   Attempted to call sister at listed number. Mailbox full unable to leave message.   Recommendations: -  Routine EEG pending - Continued medical management.  -Continue seizure precautions.  - we will continue to follow.

## 2021-01-04 ENCOUNTER — Other Ambulatory Visit: Payer: Self-pay

## 2021-01-04 DIAGNOSIS — Z8616 Personal history of COVID-19: Secondary | ICD-10-CM | POA: Diagnosis not present

## 2021-01-04 DIAGNOSIS — Z79899 Other long term (current) drug therapy: Secondary | ICD-10-CM | POA: Diagnosis not present

## 2021-01-04 DIAGNOSIS — N186 End stage renal disease: Secondary | ICD-10-CM | POA: Diagnosis not present

## 2021-01-04 DIAGNOSIS — I5032 Chronic diastolic (congestive) heart failure: Secondary | ICD-10-CM | POA: Diagnosis not present

## 2021-01-04 DIAGNOSIS — E1122 Type 2 diabetes mellitus with diabetic chronic kidney disease: Secondary | ICD-10-CM | POA: Diagnosis not present

## 2021-01-04 DIAGNOSIS — I132 Hypertensive heart and chronic kidney disease with heart failure and with stage 5 chronic kidney disease, or end stage renal disease: Secondary | ICD-10-CM | POA: Diagnosis not present

## 2021-01-04 DIAGNOSIS — R2681 Unsteadiness on feet: Secondary | ICD-10-CM | POA: Diagnosis not present

## 2021-01-04 DIAGNOSIS — Z992 Dependence on renal dialysis: Secondary | ICD-10-CM | POA: Diagnosis not present

## 2021-01-04 DIAGNOSIS — R569 Unspecified convulsions: Secondary | ICD-10-CM | POA: Diagnosis not present

## 2021-01-04 DIAGNOSIS — I10 Essential (primary) hypertension: Secondary | ICD-10-CM | POA: Diagnosis not present

## 2021-01-04 LAB — BASIC METABOLIC PANEL
Anion gap: 9 (ref 5–15)
BUN: 30 mg/dL — ABNORMAL HIGH (ref 6–20)
CO2: 28 mmol/L (ref 22–32)
Calcium: 8.2 mg/dL — ABNORMAL LOW (ref 8.9–10.3)
Chloride: 96 mmol/L — ABNORMAL LOW (ref 98–111)
Creatinine, Ser: 6.26 mg/dL — ABNORMAL HIGH (ref 0.61–1.24)
GFR, Estimated: 10 mL/min — ABNORMAL LOW (ref >60)
Glucose, Bld: 178 mg/dL — ABNORMAL HIGH (ref 70–99)
Potassium: 3.4 mmol/L — ABNORMAL LOW (ref 3.5–5.1)
Sodium: 133 mmol/L — ABNORMAL LOW (ref 135–145)

## 2021-01-04 LAB — CBC
HCT: 29 % — ABNORMAL LOW (ref 39.0–52.0)
Hemoglobin: 9.9 g/dL — ABNORMAL LOW (ref 13.0–17.0)
MCH: 35.5 pg — ABNORMAL HIGH (ref 26.0–34.0)
MCHC: 34.1 g/dL (ref 30.0–36.0)
MCV: 103.9 fL — ABNORMAL HIGH (ref 80.0–100.0)
Platelets: 134 10*3/uL — ABNORMAL LOW (ref 150–400)
RBC: 2.79 MIL/uL — ABNORMAL LOW (ref 4.22–5.81)
RDW: 11.9 % (ref 11.5–15.5)
WBC: 5.5 10*3/uL (ref 4.0–10.5)
nRBC: 0 % (ref 0.0–0.2)

## 2021-01-04 LAB — GLUCOSE, CAPILLARY
Glucose-Capillary: 119 mg/dL — ABNORMAL HIGH (ref 70–99)
Glucose-Capillary: 180 mg/dL — ABNORMAL HIGH (ref 70–99)
Glucose-Capillary: 108 mg/dL — ABNORMAL HIGH (ref 70–99)

## 2021-01-04 LAB — HEPATITIS B SURFACE ANTIBODY, QUANTITATIVE: Hep B S AB Quant (Post): 39.1 m[IU]/mL (ref >9.9)

## 2021-01-04 LAB — VALPROIC ACID LEVEL: Valproic Acid Lvl: 47 ug/mL — ABNORMAL LOW (ref 50.0–100.0)

## 2021-01-04 NOTE — TOC Transition Note (Addendum)
Transition of Care [facility]) - CM/SW Discharge Note   Patient Details  Name: Timothy Clark MRN: [id number] Date of Birth: September 22, 1970  Transition of Care [facility]) CM/SW Contact:  Verdell Carmine, RN Phone Number: 01/04/2021, 10:07 AM   Clinical Narrative:    Discharging today. Home Health with [facility] set up- already had previous RN orders faxed yesterday. To add PT  And OT.  Spoke to patient, he has a wheelchair and walker at home. He is looking for a ramp. Instructed to call medical supply companies and his insurance to see what is covered. Will DC after dialysis, patient states he has transportation home  1310 faxed H&P and discharge summary to [facility] home health [phone]     Barriers to Discharge: Continued Medical Work up   Patient Goals and CMS Choice        Discharge Placement                 Discharge to home with home health      Discharge Plan and Services   Discharge Planning Services: CM Consult                      HH Arranged: OT,PT,RN [place]: [facility] [place], [place]) Date [facility] Agency Contacted: 01/03/21 Time HH Agency Contacted: 1300 Representative spoke with at [place]: [place] ([place]) Interventions     Readmission Risk Interventions Readmission Risk Prevention Plan 07/24/2020 10/27/2018 10/25/2018  Transportation Screening Complete Complete Complete  PCP or Specialist Appt within 3-5 Days - Complete -  Home Care Screening Complete - -  Medication Review (RN CM) Complete - -  HRI or [place] - Complete -  Social Work Consult for [employer] Planning/Counseling - Not Complete -  Palliative Care Screening - Not Applicable Not Applicable  Medication Review (RN Care Manager) - Complete -  Some recent data might be hidden

## 2021-01-04 NOTE — Progress Notes (Signed)
Renal Navigator faxed Discharge Summary to [facility] to provide continuity of care.   Alphonzo Cruise, [place] Renal Navigator [phone]

## 2021-01-04 NOTE — Plan of Care (Signed)

## 2021-01-04 NOTE — Progress Notes (Signed)
Neurology Progress Note  S: No overnight events. No further episodes of seizure like activity. Hospitalist came in room. Plan is to discharge patient today after HD. Patient states he feels well. Stressed to patient importance of taking his seizure medication as prescribed and that when he goes home, he will take Depakote tid now. He states his medicines are put into pill packs, which is good.   O: Current vital signs: BP 122/75 (BP Location: Right Arm)   Pulse 79   Temp 98.4 F (36.9 C) (Oral)   Resp 10   Wt 112.6 kg   SpO2 100%   BMI 33.67 kg/m  Vital signs in last 24 hours: Temp:  [97.3 F (36.3 C)-98.5 F (36.9 C)] 98.4 F (36.9 C) (05/27 0747) Pulse Rate:  [62-90] 79 (05/27 0747) Resp:  [10-17] 10 (05/27 0747) BP: (93-135)/(35-80) 122/75 (05/27 0747) SpO2:  [100 %] 100 % (05/27 0747) Weight:  [112.6 kg-114.5 kg] 112.6 kg (05/27 0538)  GENERAL: Awake, alert in NAD. HEENT: Normocephalic and atraumatic. LUNGS: Normal respiratory effort.  CV: RRR. Ext: warm.  NEURO:  Mental Status: alert, oriented except to day and date.  Speech/Language: speech is without aphasia or dysarthria.  Patient is partially blind so is unable to name objects. Comprehension intact.   Cranial Nerves:  II: Equal, pinpoint pupils, ? Reactive. Patient can see NPs fingers and count them if they are held close and in center of vision.  III, IV, VI: EOMI. Eyelids elevate symmetrically.  V: Sensation is intact to light touch and symmetrical to face.  VII: Smile is symmetrical.  VIII: hearing intact to voice. IX, X: Palate elevates symmetrically. Phonation is normal.  XI: Shoulder shrug 5/5. XII: tongue is midline without fasciculations. Motor: 5/5 strength to all muscle groups tested.  Tone: is normal and bulk is normal Sensation- Intact to light touch bilaterally but decreased in bilateral feet.    Coordination: FNF intact. No tremors or clonus noted. No jerking or twitching or face or extremities.   Gait- deferred  Medications  Current Facility-Administered Medications:  .  0.9 %  sodium chloride infusion, 250 mL, Intravenous, PRN, Opyd, Ilene Qua, MD .  acetaminophen (TYLENOL) tablet 650 mg, 650 mg, Oral, Q6H PRN **OR** acetaminophen (TYLENOL) suppository 650 mg, 650 mg, Rectal, Q6H PRN, Opyd, Ilene Qua, MD .  ALPRAZolam Duanne Moron) tablet 0.5 mg, 0.5 mg, Oral, BID PRN, Opyd, Ilene Qua, MD .  atorvastatin (LIPITOR) tablet 10 mg, 10 mg, Oral, QHS, Opyd, Ilene Qua, MD, 10 mg at 01/03/21 2139 .  Chlorhexidine Gluconate Cloth 2 % PADS 6 each, 6 each, Topical, Q0600, Penninger, Lindsay, PA .  divalproex (DEPAKOTE) DR tablet 750 mg, 750 mg, Oral, Q8H, Dennison Mascot, PA-C, 750 mg at 01/04/21 0542 .  heparin injection 5,000 Units, 5,000 Units, Subcutaneous, Q8H, Opyd, Ilene Qua, MD, 5,000 Units at 01/04/21 0542 .  insulin aspart (novoLOG) injection 0-6 Units, 0-6 Units, Subcutaneous, TID WC, Opyd, Timothy S, MD .  LORazepam (ATIVAN) injection 1 mg, 1 mg, Intravenous, Q6H PRN, Opyd, Timothy S, MD .  pantoprazole (PROTONIX) EC tablet 40 mg, 40 mg, Oral, BID AC, Opyd, Ilene Qua, MD, 40 mg at 01/03/21 2139 .  sucralfate (CARAFATE) tablet 1 g, 1 g, Oral, QID, Opyd, Ilene Qua, MD, 1 g at 01/03/21 2139 .  ziprasidone (GEODON) capsule 20 mg, 20 mg, Oral, Q breakfast, Opyd, Ilene Qua, MD, 20 mg at 01/03/21 0840 .  ziprasidone (GEODON) capsule 40 mg, 40 mg, Oral, Q supper, Opyd, [employer]  S, MD, 40 mg at 01/03/21 2140  EEG:  This study is within normal limits. No seizures or epileptiform discharges were seen throughout the recording.  Assessment: 50 yo male which neurology was consulted for seizures. He was found to have a low VPA level on admission, which is likely the etiology of his seizures. Depakote increased to '750mg'$  po tid and he has had no further seizure like activity. EEG was negative. He presented with AMS thought to be post ictal state vs. Versed administration in the field. He is back to  baseline mental status now.   Recommendations: -continue Depakote '750mg'$  po tid.  -He needs out patient f/up with his neurologist in one month. -stressed importance of being compliant with medications.  -Hospitalist plan to discharge patient after HD today, which is fine with neurology.   Per [facility] statutes, patients with seizures are not allowed to drive until they have been seizure-free for six months.   Use caution when using heavy equipment or power tools. Avoid working on ladders or at heights. Take showers instead of baths. Ensure the water temperature is not too high on the home water heater. Do not go swimming alone. Do not lock yourself in a room alone (i.e. bathroom). When caring for infants or small children, sit down when holding, feeding, or changing them to minimize risk of injury to the child in the event you have a seizure. Maintain good sleep hygiene. Avoid alcohol.   If patient has another seizure, call 911 and bring them back to the ED if: A. The seizure lasts longer than 5 minutes.  B. The patient doesn't wake shortly after the seizure or has new problems such as difficulty seeing, speaking or moving following the seizure C. The patient was injured during the seizure D. The patient has a temperature over 102 F (39C) E. The patient vomited during the seizure and now is having trouble breathing  Pt seen by Clance Boll, MSN, APN-BC/Nurse Practitioner/Neuro Pager: [phone]

## 2021-01-04 NOTE — Progress Notes (Signed)
Occupational Therapy Treatment Patient Details Name: Timothy Clark MRN: [id number] DOB: Oct 16, 1970 Today's Date: 01/04/2021    History of present illness 50yo male admitted 01/02/21 with concerns for seizure activity. PMH anxiety, CHF, hx covid, ESRD on HD, HTN, DM with PN, great toe amputation, bipolar   OT comments  Pt ambulated with hand held guidance to bathroom and then to sink for grooming with min assist, primarily due to low vision. Pt reports he has assist to dress R foot at home. He stands to shower, but would feel safer sitting. Recommending tub seat with a back and hand held shower seat. Pt feels back to normal and is eager to go home later today following HD.  Follow Up Recommendations  Home health OT    Equipment Recommendations  Tub/shower seat (may defer to [facility])    Recommendations for Other Services      Precautions / Restrictions Precautions Precautions: Fall;Other (comment) Precaution Comments: legally blind       Mobility Bed Mobility Overal bed mobility: Needs Assistance Bed Mobility: Supine to Sit;Sit to Supine     Supine to sit: Supervision;HOB elevated Sit to supine: Supervision   General bed mobility comments: assist for line management, HOB flat    Transfers Overall transfer level: Needs assistance Equipment used: 1 person hand held assist Transfers: Sit to/from Stand Sit to Stand: Min guard         General transfer comment: guidance and line management    Balance Overall balance assessment: Needs assistance   Sitting balance-Leahy Scale: Good     Standing balance support: No upper extremity supported Standing balance-Leahy Scale: Fair Standing balance comment: fair static standing at sink                           ADL either performed or assessed with clinical judgement   ADL Overall ADL's : Needs assistance/impaired Eating/Feeding: Minimal assistance;Sitting Eating/Feeding Details (indicate cue type and reason):  uses clock method at home Grooming: Wash/dry hands;Standing;Minimal assistance Grooming Details (indicate cue type and reason): assist to locate soap and paper towels         Upper Body Dressing : Minimal assistance;Sitting Upper Body Dressing Details (indicate cue type and reason): to orient gown Lower Body Dressing: Minimal assistance;Sitting/lateral leans Lower Body Dressing Details (indicate cue type and reason): assisted at baseline for R sock Toilet Transfer: Minimal assistance;Ambulation Toilet Transfer Details (indicate cue type and reason): hand held assist Toileting- Clothing Manipulation and Hygiene: Minimal assistance;Sit to/from stand       Functional mobility during ADLs: Minimal assistance General ADL Comments: Alert and interactive today.     Vision       Perception     Praxis      Cognition Arousal/Alertness: Awake/alert Behavior During Therapy: WFL for tasks assessed/performed Overall Cognitive Status: Within Functional Limits for tasks assessed                                          Exercises     Shoulder Instructions       General Comments      Pertinent Vitals/ Pain       Pain Assessment: No/denies pain  Home Living  Prior Functioning/Environment              Frequency  Min 2X/week        Progress Toward Goals  OT Goals(current goals can now be found in the care plan section)  Progress towards OT goals: Progressing toward goals  Acute Rehab OT Goals Patient Stated Goal: to go home after HD OT Goal Formulation: With patient Time For Goal Achievement: 01/17/21 Potential to Achieve Goals: Good  Plan Discharge plan remains appropriate    Co-evaluation                 AM-PAC OT "6 Clicks" Daily Activity     Outcome Measure   Help from another person eating meals?: A Little Help from another person taking care of personal grooming?: A  Little Help from another person toileting, which includes using toliet, bedpan, or urinal?: A Little Help from another person bathing (including washing, rinsing, drying)?: A Little Help from another person to put on and taking off regular upper body clothing?: A Little Help from another person to put on and taking off regular lower body clothing?: A Little 6 Click Score: 18    End of Session Equipment Utilized During Treatment: Gait belt  OT Visit Diagnosis: Unsteadiness on feet (R26.81)   Activity Tolerance Patient tolerated treatment well   Patient Left in bed;with call bell/phone within reach;with bed alarm set   Nurse Communication          Time: [id number] OT Time Calculation (min): 18 min  Charges: OT General Charges $OT Visit: 1 Visit OT Treatments $Self Care/Home Management : 8-22 mins  Nestor Lewandowsky, OTR/L Acute Rehabilitation Services Pager: [phone] Office: [phone]   Malka So 01/04/2021, 11:35 AM

## 2021-01-04 NOTE — Discharge Summary (Signed)
Physician Discharge Summary  Timothy Clark DOB: May 07, 1971 DOA: 01/02/2021  PCP: [name], MD  Admit date: 01/02/2021 Discharge date: 01/04/2021  Admitted From: Home Disposition: Home with home health  Recommendations for Outpatient Follow-up:  1. Follow up with PCP in 1-2 weeks 2. Follow-up with neurology as a scheduled 3. All-time fall precautions and seizure precautions.  Home Health: PT/OT Equipment/Devices: Available at home  Discharge Condition: Fair CODE STATUS: Full code Diet recommendation: Low-salt and low-carb diet  Discharge summary: 50 year old gentleman with history of ESRD on hemodialysis, seizures, chronic diastolic heart failure, GERD with esophagitis, hypertension, type 2 diabetes, history of psychiatric disorder and previous suicidal ideation presented from home with seizure-like episode.  Patient does not remember events.  Apparently he was confused and combative, ems was called and was treated with Haldol and Versed brought to the ER.  Last dialysis 5/23.  In the emergency room afebrile.  On room air.  Blood pressures normal.  Head CT normal. COVID-19 negative.  Transferred from [facility] to [facility] for neurology consultation.  # Seizure versus nonepileptic form seizures in a patient with known seizure disorder on Depakote.  Patient also with advanced psychiatric and mental health illnesses. Subtherapeutic valproic acid level.  Patient reported taking medication twice a day only. EEG without any new findings.  No episodes of seizures since being in the hospital. Seen and followed by neurology. Recommended to resume Depakote on previous prescribed level 750 mg 3 times a day. All-time seizure precautions.  Fall precautions.  He is mostly supervised at home.  # ESRD on hemodialysis: Monday Wednesday and Friday schedule.  Received dialysis 5/26.  Will need dialysis today so that he can go back on his schedule Monday.  Stable.     # Essential hypertension: Blood pressure stable on current regimen continue.  # Bipolar disorder: Currently symptoms well controlled.  On Geodon that was resumed.  Also uses occasional Ativan that he will continue.  # Type 2 diabetes with retinopathy and blindness: On Januvia at home.  Well controlled.  Sister reported hypoglycemic episodes, however no evidence of hypoglycemia in the hospital.  Januvia less likely to cause hypoglycemia.  Continue Januvia.  Asked to liberalize carb diet.  Patient is fairly stabilized.  He will be able to go home after hemodialysis session today so he can resume dialysis Monday 5/30.    Discharge Diagnoses:  Principal Problem:   Seizures ([place]) Active Problems:   Depression   HTN (hypertension), malignant   ESRD on dialysis ([place])   Type 2 diabetes with nephropathy ([place])   Anxiety   Bipolar disorder ([place])   Seizure ([place])    Discharge Instructions  Discharge Instructions    Diet - low sodium heart healthy   Complete by: As directed    Diet Carb Modified   Complete by: As directed    Discharge instructions   Complete by: As directed    Take your seizure medicine 3 times a day as scheduled and prescribed.   Increase activity slowly   Complete by: As directed      Allergies as of 01/04/2021      Reactions   Donnatal [phenobarbital-belladonna Alk] Anaphylaxis, Other (See Comments)   Reaction to GI cocktail   Fish Allergy Anaphylaxis, Hives, Rash   Haldol [haloperidol Lactate] Other (See Comments)   Chest Pain   Lidocaine Anaphylaxis, Other (See Comments)   Reaction to GI cocktail   Maalox [calcium Carbonate Antacid] Anaphylaxis, Other (See Comments)  Reaction to GI cocktail   Shellfish Allergy Hives   Per patient: Patient stated he breaks out in hives when eating shellfish   Aspirin Hives, Itching   Doxycycline Hives, Nausea And Vomiting, Other (See Comments)   Severe    Keflex [cephalexin] Hives, Itching, Nausea And Vomiting    Marinol [dronabinol] Nausea And Vomiting, Other (See Comments)   Caused worsening vomiting.    Other Swelling   Penicillins Hives, Itching, Other (See Comments), Rash   Has patient had a PCN reaction causing immediate rash, facial/tongue/throat swelling, SOB or lightheadedness with hypotension: yes Has patient had a PCN reaction causing severe rash involving mucus membranes or skin necrosis: yes Has patient had a PCN reaction that required hospitalization no Has patient had a PCN reaction occurring within the last 10 years: yes If all of the above answers are "NO", then may proceed with Cephalospor   Bactrim [sulfamethoxazole-trimethoprim] Itching      Medication List    STOP taking these medications   metoCLOPramide 5 MG tablet Commonly known as: REGLAN   oxyCODONE-acetaminophen 5-325 MG tablet Commonly known as: PERCOCET/ROXICET   valproic acid 250 MG capsule Commonly known as: DEPAKENE     TAKE these medications   ALPRAZolam 0.5 MG tablet Commonly known as: XANAX Take 0.5 mg by mouth 2 (two) times daily as needed for anxiety.   amLODipine 5 MG tablet Commonly known as: NORVASC Take 5 mg by mouth daily.   atorvastatin 10 MG tablet Commonly known as: LIPITOR Take 1 tablet (10 mg total) by mouth at bedtime.   brimonidine 0.15 % ophthalmic solution Commonly known as: ALPHAGAN Place 1 drop into both eyes in the morning and at bedtime.   divalproex 250 MG DR tablet Commonly known as: DEPAKOTE Take 750 mg by mouth 3 (three) times daily.   dorzolamide-timolol 22.3-6.8 MG/ML ophthalmic solution Commonly known as: COSOPT Place 1 drop into both eyes 2 (two) times daily.   folic acid 1 MG tablet Commonly known as: FOLVITE Take 1 mg by mouth daily.   FUSION PLUS PO Take 1 capsule by mouth daily.   gentamicin cream 0.1 % Commonly known as: GARAMYCIN Apply 1 application topically 2 (two) times daily.   Gvoke HypoPen 2-Pack 1 MG/0.2ML Soaj Generic drug:  Glucagon Inject 1 Syringe into the skin as needed (hypoglycemia).   Leader Unifine Pentips Plus 32G X 4 MM Misc Generic drug: Insulin Pen Needle   pantoprazole 40 MG tablet Commonly known as: PROTONIX Take 1 tablet (40 mg total) by mouth 2 (two) times daily before a meal.   polyethylene glycol 17 g packet Commonly known as: MIRALAX / GLYCOLAX Take 17 g by mouth daily as needed for mild constipation.   senna-docusate 8.6-50 MG tablet Commonly known as: Senokot-S Take 1 tablet by mouth 2 (two) times daily.   sitaGLIPtin 25 MG tablet Commonly known as: JANUVIA Take 25 mg by mouth daily.   sucralfate 1 g tablet Commonly known as: CARAFATE Take 1 g by mouth 4 (four) times daily.   ziprasidone 20 MG capsule Commonly known as: GEODON Take 1 tab with breakfast and 2 tabs at HS What changed:   how much to take  how to take this  when to take this  additional instructions       Follow-up Information    [facility] [place] Follow up.   Why: for PT OT and RN             Allergies  Allergen Reactions  .  Donnatal [Phenobarbital-Belladonna Alk] Anaphylaxis and Other (See Comments)    Reaction to GI cocktail  . Fish Allergy Anaphylaxis, Hives and Rash  . Haldol [Haloperidol Lactate] Other (See Comments)    Chest Pain  . Lidocaine Anaphylaxis and Other (See Comments)    Reaction to GI cocktail  . Maalox [Calcium Carbonate Antacid] Anaphylaxis and Other (See Comments)    Reaction to GI cocktail  . Shellfish Allergy Hives    Per patient: Patient stated he breaks out in hives when eating shellfish  . Aspirin Hives and Itching  . Doxycycline Hives, Nausea And Vomiting and Other (See Comments)    Severe   . Keflex [Cephalexin] Hives, Itching and Nausea And Vomiting  . Marinol [Dronabinol] Nausea And Vomiting and Other (See Comments)    Caused worsening vomiting.   . Other Swelling  . Penicillins Hives, Itching, Other (See Comments) and Rash    Has patient  had a PCN reaction causing immediate rash, facial/tongue/throat swelling, SOB or lightheadedness with hypotension: yes Has patient had a PCN reaction causing severe rash involving mucus membranes or skin necrosis: yes Has patient had a PCN reaction that required hospitalization no Has patient had a PCN reaction occurring within the last 10 years: yes If all of the above answers are "NO", then may proceed with Cephalospor  . Bactrim [Sulfamethoxazole-Trimethoprim] Itching    Consultations:  Neurology  Nephrology   Procedures/Studies: DG Lumbar Spine Complete  Result Date: 12/24/2020 CLINICAL DATA:  Low back pain after falling.  Hypoglycemia. EXAM: LUMBAR SPINE - COMPLETE 4+ VIEW COMPARISON:  Abdominopelvic CT 10/13/2018. FINDINGS: There are 5 lumbar type vertebral bodies. The alignment is normal. There is moderate spondylosis with disc space narrowing and endplate osteophytes at L3-4 and L4-5. Mild facet degenerative changes are present inferiorly. No evidence of acute fracture or pars defect. IMPRESSION: No evidence of acute lumbar spine injury. Moderate lower lumbar spondylosis, similar to previous abdominal CT. Electronically Signed   By: Richardean Sale M.D.   On: 12/24/2020 12:27   EEG adult  Result Date: 01/03/2021 Lora Havens, MD     01/03/2021  3:43 PM Patient Name: Timothy Clark MRN: [id number] Epilepsy Attending: Lora Havens Referring Physician/Provider: Dr Mitzi Hansen Date: 01/03/2021 Duration: 25.02 mins Patient history: 50 year old male with seizure-like activity.  EEG to evaluate for seizures. Level of alertness: Awake AEDs during EEG study: Depakote Technical aspects: This EEG study was done with scalp electrodes positioned according to the 10-20 International system of electrode placement. Electrical activity was acquired at a sampling rate of '500Hz'  and reviewed with a high frequency filter of '70Hz'  and a low frequency filter of '1Hz' . EEG data were recorded continuously  and digitally stored. Description: The posterior dominant rhythm consists of 8-9 Hz activity of moderate voltage (25-35 uV) seen predominantly in posterior head regions, symmetric and reactive to eye opening and eye closing. Physiologic photic driving was not seen during photic stimulation. Hyperventilation was not performed.   IMPRESSION: This study is within normal limits. No seizures or epileptiform discharges were seen throughout the recording. Priyanka O Yadav    (Echo, Carotid, EGD, Colonoscopy, ERCP)    Subjective: Patient seen and examined.  Denies any overnight events.  Denies any complaints.  He thinks his sister will pick him up but it has to be before 3 PM.  He feels fine to go home as he has adequate support at home.  His sisters are  phone call away and he lives with his dad.  Discharge Exam: Vitals:   01/04/21 0401 01/04/21 0747  BP: 110/63 122/75  Pulse: 68 79  Resp: 12 10  Temp: 98.5 F (36.9 C) 98.4 F (36.9 C)  SpO2: 100% 100%   Vitals:   01/03/21 2350 01/04/21 0401 01/04/21 0538 01/04/21 0747  BP: 106/65 110/63  122/75  Pulse: 74 68  79  Resp: '13 12  10  ' Temp: 98.3 F (36.8 C) 98.5 F (36.9 C)  98.4 F (36.9 C)  TempSrc: Axillary Axillary  Oral  SpO2: 100% 100%  100%  Weight:   112.6 kg     General: Pt is alert, awake, not in acute distress Chronically sick looking and frail and debilitated gentleman.  Older than his stated age. He is blind in both eyes, can only see webbing of fingers. Cardiovascular: RRR, S1/S2 +, no rubs, no gallops Respiratory: CTA bilaterally, no wheezing, no rhonchi Abdominal: Soft, NT, ND, bowel sounds + Extremities:  Left upper extremity AV fistula present with thrill. Bilateral lower extremity with dry and scaly skin, no open wounds. Right toe amputation the stump clean and dry.    The results of significant diagnostics from this hospitalization (including imaging, microbiology, ancillary and laboratory) are listed below  for reference.     Microbiology: No results found for this or any previous visit (from the past 240 hour(s)).   Labs: BNP (last 3 results) No results for input(s): BNP in the last 8760 hours. Basic Metabolic Panel: Recent Labs  Lab 01/02/21 2306 01/03/21 0202 01/04/21 0101  NA 133* 134* 133*  K 3.4* 3.2* 3.4*  CL 94* 97* 96*  CO2 '27 28 28  ' GLUCOSE 158* 165* 178*  BUN 55* 53* 30*  CREATININE 7.97* 7.83* 6.26*  CALCIUM 8.4* 8.5* 8.2*  MG  --  2.0  --   PHOS  --  5.1*  --    Liver Function Tests: No results for input(s): AST, ALT, ALKPHOS, BILITOT, PROT, ALBUMIN in the last 168 hours. No results for input(s): LIPASE, AMYLASE in the last 168 hours. No results for input(s): AMMONIA in the last 168 hours. CBC: Recent Labs  Lab 01/02/21 2306 01/03/21 0202 01/04/21 0101  WBC 6.3 5.8 5.5  HGB 10.7* 10.2* 9.9*  HCT 32.2* 31.8* 29.0*  MCV 106.3* 108.2* 103.9*  PLT 145* 142* 134*   Cardiac Enzymes: No results for input(s): CKTOTAL, CKMB, CKMBINDEX, TROPONINI in the last 168 hours. BNP: Invalid input(s): POCBNP CBG: Recent Labs  Lab 01/03/21 0800 01/03/21 1244 01/03/21 1609 01/03/21 2135 01/04/21 0750  GLUCAP 86 115* 102* 162* 119*   D-Dimer No results for input(s): DDIMER in the last 72 hours. Hgb A1c Recent Labs    01/03/21 0202  HGBA1C 5.3   Lipid Profile No results for input(s): CHOL, HDL, LDLCALC, TRIG, CHOLHDL, LDLDIRECT in the last 72 hours. Thyroid function studies No results for input(s): TSH, T4TOTAL, T3FREE, THYROIDAB in the last 72 hours.  Invalid input(s): FREET3 Anemia work up No results for input(s): VITAMINB12, FOLATE, FERRITIN, TIBC, IRON, RETICCTPCT in the last 72 hours. Urinalysis    Component Value Date/Time   COLORURINE YELLOW 10/24/2018 1424   APPEARANCEUR CLEAR 10/24/2018 1424   LABSPEC 1.014 10/24/2018 1424   PHURINE 5.0 10/24/2018 1424   GLUCOSEU >=500 (A) 10/24/2018 1424   HGBUR MODERATE (A) 10/24/2018 1424   BILIRUBINUR  NEGATIVE 10/24/2018 1424   KETONESUR NEGATIVE 10/24/2018 1424   PROTEINUR >=300 (A) 10/24/2018 1424   UROBILINOGEN 0.2 06/07/2015 1353   NITRITE NEGATIVE 10/24/2018 1424  LEUKOCYTESUR NEGATIVE 10/24/2018 1424   Sepsis Labs Invalid input(s): PROCALCITONIN,  WBC,  LACTICIDVEN Microbiology No results found for this or any previous visit (from the past 240 hour(s)).   Time coordinating discharge:  28 minutes  SIGNED:   Barb Merino, MD  [facility] 01/04/2021, 11:08 AM

## 2021-01-05 DIAGNOSIS — E1122 Type 2 diabetes mellitus with diabetic chronic kidney disease: Secondary | ICD-10-CM | POA: Diagnosis not present

## 2021-01-05 DIAGNOSIS — E1143 Type 2 diabetes mellitus with diabetic autonomic (poly)neuropathy: Secondary | ICD-10-CM | POA: Diagnosis not present

## 2021-01-05 DIAGNOSIS — N186 End stage renal disease: Secondary | ICD-10-CM | POA: Diagnosis not present

## 2021-01-05 DIAGNOSIS — D631 Anemia in chronic kidney disease: Secondary | ICD-10-CM | POA: Diagnosis not present

## 2021-01-05 DIAGNOSIS — I132 Hypertensive heart and chronic kidney disease with heart failure and with stage 5 chronic kidney disease, or end stage renal disease: Secondary | ICD-10-CM | POA: Diagnosis not present

## 2021-01-05 DIAGNOSIS — I5032 Chronic diastolic (congestive) heart failure: Secondary | ICD-10-CM | POA: Diagnosis not present

## 2021-01-07 DIAGNOSIS — N186 End stage renal disease: Secondary | ICD-10-CM | POA: Diagnosis not present

## 2021-01-07 DIAGNOSIS — Z992 Dependence on renal dialysis: Secondary | ICD-10-CM | POA: Diagnosis not present

## 2021-01-08 ENCOUNTER — Telehealth: Payer: Self-pay | Admitting: *Deleted

## 2021-01-08 DIAGNOSIS — K219 Gastro-esophageal reflux disease without esophagitis: Secondary | ICD-10-CM | POA: Diagnosis not present

## 2021-01-08 DIAGNOSIS — Z992 Dependence on renal dialysis: Secondary | ICD-10-CM | POA: Diagnosis not present

## 2021-01-08 DIAGNOSIS — N186 End stage renal disease: Secondary | ICD-10-CM | POA: Diagnosis not present

## 2021-01-08 DIAGNOSIS — E1165 Type 2 diabetes mellitus with hyperglycemia: Secondary | ICD-10-CM | POA: Diagnosis not present

## 2021-01-08 NOTE — Telephone Encounter (Signed)
Error

## 2021-01-10 DIAGNOSIS — N186 End stage renal disease: Secondary | ICD-10-CM | POA: Diagnosis not present

## 2021-01-10 DIAGNOSIS — Z992 Dependence on renal dialysis: Secondary | ICD-10-CM | POA: Diagnosis not present

## 2021-01-11 DIAGNOSIS — Z992 Dependence on renal dialysis: Secondary | ICD-10-CM | POA: Diagnosis not present

## 2021-01-11 DIAGNOSIS — N186 End stage renal disease: Secondary | ICD-10-CM | POA: Diagnosis not present

## 2021-01-12 DIAGNOSIS — E1143 Type 2 diabetes mellitus with diabetic autonomic (poly)neuropathy: Secondary | ICD-10-CM | POA: Diagnosis not present

## 2021-01-12 DIAGNOSIS — E1122 Type 2 diabetes mellitus with diabetic chronic kidney disease: Secondary | ICD-10-CM | POA: Diagnosis not present

## 2021-01-12 DIAGNOSIS — N186 End stage renal disease: Secondary | ICD-10-CM | POA: Diagnosis not present

## 2021-01-12 DIAGNOSIS — I5032 Chronic diastolic (congestive) heart failure: Secondary | ICD-10-CM | POA: Diagnosis not present

## 2021-01-12 DIAGNOSIS — I132 Hypertensive heart and chronic kidney disease with heart failure and with stage 5 chronic kidney disease, or end stage renal disease: Secondary | ICD-10-CM | POA: Diagnosis not present

## 2021-01-12 DIAGNOSIS — D631 Anemia in chronic kidney disease: Secondary | ICD-10-CM | POA: Diagnosis not present

## 2021-01-13 DIAGNOSIS — N183 Chronic kidney disease, stage 3 unspecified: Secondary | ICD-10-CM | POA: Diagnosis not present

## 2021-01-13 DIAGNOSIS — E118 Type 2 diabetes mellitus with unspecified complications: Secondary | ICD-10-CM | POA: Diagnosis not present

## 2021-01-13 DIAGNOSIS — N186 End stage renal disease: Secondary | ICD-10-CM | POA: Diagnosis not present

## 2021-01-13 DIAGNOSIS — M86179 Other acute osteomyelitis, unspecified ankle and foot: Secondary | ICD-10-CM | POA: Diagnosis not present

## 2021-01-13 DIAGNOSIS — E1122 Type 2 diabetes mellitus with diabetic chronic kidney disease: Secondary | ICD-10-CM | POA: Diagnosis not present

## 2021-01-14 ENCOUNTER — Ambulatory Visit: Payer: Medicare HMO | Admitting: Podiatry

## 2021-01-14 ENCOUNTER — Other Ambulatory Visit: Payer: Self-pay

## 2021-01-14 DIAGNOSIS — N186 End stage renal disease: Secondary | ICD-10-CM | POA: Diagnosis not present

## 2021-01-14 DIAGNOSIS — L97522 Non-pressure chronic ulcer of other part of left foot with fat layer exposed: Secondary | ICD-10-CM

## 2021-01-14 DIAGNOSIS — L97402 Non-pressure chronic ulcer of unspecified heel and midfoot with fat layer exposed: Secondary | ICD-10-CM

## 2021-01-14 DIAGNOSIS — E08621 Diabetes mellitus due to underlying condition with foot ulcer: Secondary | ICD-10-CM

## 2021-01-14 DIAGNOSIS — Z992 Dependence on renal dialysis: Secondary | ICD-10-CM | POA: Diagnosis not present

## 2021-01-14 DIAGNOSIS — E0843 Diabetes mellitus due to underlying condition with diabetic autonomic (poly)neuropathy: Secondary | ICD-10-CM

## 2021-01-14 NOTE — Progress Notes (Signed)
Subjective:  50 y.o. male with PMHx of diabetes mellitus, ESRD, presenting for follow-up evaluation of an ulcer that is developed to the left hallux.  Patient states that the nurse has not come out to the house for the last 2 weeks now.  They have been changing dressings at home.  No new complaints at this time  Past Medical History:  Diagnosis Date  . Anemia   . Anxiety   . CHF (congestive heart failure) (HCC)    diastolic dysfunction  . Chronic abdominal pain   . COVID-19 07/2019  . Depression   . Esophagitis   . ESRD on hemodialysis [facility])    undergoing evaluation for transplant  . Eye hemorrhage   . Gastritis   . Gastroparesis   . GERD (gastroesophageal reflux disease)   . Glaucoma   . Helicobacter pylori (H. pylori) infection 2016   ERADICATION DOCUMENTED VIA STOOL TEST in AUG 2016 at [facility]  . Hiccups   . Hypertension   . Nausea and vomiting    chronic, recurrent  . Neuropathy    feet  . Renal insufficiency   . Type 2 diabetes mellitus with complications ([place])    diagnosed around age 46  . Vitreous hemorrhage of left eye ([place]) 12/15/2019    Objective/Physical Exam General: The patient is alert and oriented x3 in no acute distress.  Dermatology:  Wound #1 noted to the left hallux measuring approximately 1.0x1.5 x 0.2 cm (LxWxD).   To the noted ulceration(s), there is no eschar. There is a moderate amount of slough, fibrin, and necrotic tissue noted. Granulation tissue and wound base is red. There is a minimal amount of serosanguineous drainage noted. There is no exposed bone muscle-tendon ligament or joint. There is no malodor. Periwound integrity is intact. Skin is warm, dry and supple bilateral lower extremities.  Thickened, hyperkeratotic, dystrophic, elongated nails also noted bilateral  Vascular: Palpable pedal pulses bilaterally. No edema or erythema noted. Capillary refill within normal limits.  Neurological: Epicritic and protective threshold diminished  bilaterally.   Musculoskeletal Exam: History of partial first ray amputation right foot  Assessment: 1.  Ulcer left hallux  secondary to diabetes mellitus 2. diabetes mellitus w/ peripheral neuropathy 3. H/o partial first ray amputation right foot   Plan of Care:  1. Patient was evaluated.  2. medically necessary excisional debridement including subcutaneous tissue was performed using a tissue nipper and a chisel blade. Excisional debridement of all the necrotic nonviable tissue down to healthy bleeding viable tissue was performed with post-debridement measurements same as pre-. 3. the wound was cleansed and dry sterile dressing applied. 4.  Continue home health dressing changes weekly  5.  Continue diabetic shoes and insoles with the felt offloading dancers pads 6.  Patient states he has not been applying the gentamicin cream that was prescribed last visit.  He states that the nurse needs an official order to apply the cream.  Order was provided for the patient today on a prescription paper.  Apply daily. 7.  Return to clinic in 4 weeks, at this time we will pursue wound graft application.  The patient has failed conservative treatment for this wound for greater than 6 weeks and he has a history of right hallux amputation.  Edrick Kins, DPM [facility]  Dr. Edrick Kins, DPM    [address]  [place], [place] [address]                Office [phone]  Fax [phone]

## 2021-01-15 DIAGNOSIS — R079 Chest pain, unspecified: Secondary | ICD-10-CM | POA: Diagnosis not present

## 2021-01-15 DIAGNOSIS — Z91013 Allergy to seafood: Secondary | ICD-10-CM | POA: Diagnosis not present

## 2021-01-15 DIAGNOSIS — Z880 Allergy status to penicillin: Secondary | ICD-10-CM | POA: Diagnosis not present

## 2021-01-15 DIAGNOSIS — R456 Violent behavior: Secondary | ICD-10-CM | POA: Diagnosis not present

## 2021-01-15 DIAGNOSIS — E1122 Type 2 diabetes mellitus with diabetic chronic kidney disease: Secondary | ICD-10-CM | POA: Diagnosis not present

## 2021-01-15 DIAGNOSIS — I129 Hypertensive chronic kidney disease with stage 1 through stage 4 chronic kidney disease, or unspecified chronic kidney disease: Secondary | ICD-10-CM | POA: Diagnosis not present

## 2021-01-15 DIAGNOSIS — I10 Essential (primary) hypertension: Secondary | ICD-10-CM | POA: Diagnosis not present

## 2021-01-15 DIAGNOSIS — N189 Chronic kidney disease, unspecified: Secondary | ICD-10-CM | POA: Diagnosis not present

## 2021-01-15 DIAGNOSIS — Z886 Allergy status to analgesic agent status: Secondary | ICD-10-CM | POA: Diagnosis not present

## 2021-01-15 DIAGNOSIS — R1084 Generalized abdominal pain: Secondary | ICD-10-CM | POA: Diagnosis not present

## 2021-01-15 DIAGNOSIS — Z992 Dependence on renal dialysis: Secondary | ICD-10-CM | POA: Diagnosis not present

## 2021-01-15 DIAGNOSIS — Z888 Allergy status to other drugs, medicaments and biological substances status: Secondary | ICD-10-CM | POA: Diagnosis not present

## 2021-01-16 DIAGNOSIS — N186 End stage renal disease: Secondary | ICD-10-CM | POA: Diagnosis not present

## 2021-01-16 DIAGNOSIS — Z992 Dependence on renal dialysis: Secondary | ICD-10-CM | POA: Diagnosis not present

## 2021-01-17 ENCOUNTER — Ambulatory Visit (INDEPENDENT_AMBULATORY_CARE_PROVIDER_SITE_OTHER): Payer: Medicare HMO | Admitting: Vascular Surgery

## 2021-01-17 ENCOUNTER — Encounter (INDEPENDENT_AMBULATORY_CARE_PROVIDER_SITE_OTHER): Payer: Medicare HMO

## 2021-01-17 DIAGNOSIS — I132 Hypertensive heart and chronic kidney disease with heart failure and with stage 5 chronic kidney disease, or end stage renal disease: Secondary | ICD-10-CM | POA: Diagnosis not present

## 2021-01-17 DIAGNOSIS — E1143 Type 2 diabetes mellitus with diabetic autonomic (poly)neuropathy: Secondary | ICD-10-CM | POA: Diagnosis not present

## 2021-01-17 DIAGNOSIS — N186 End stage renal disease: Secondary | ICD-10-CM | POA: Diagnosis not present

## 2021-01-17 DIAGNOSIS — E1122 Type 2 diabetes mellitus with diabetic chronic kidney disease: Secondary | ICD-10-CM | POA: Diagnosis not present

## 2021-01-17 DIAGNOSIS — I5032 Chronic diastolic (congestive) heart failure: Secondary | ICD-10-CM | POA: Diagnosis not present

## 2021-01-17 DIAGNOSIS — D631 Anemia in chronic kidney disease: Secondary | ICD-10-CM | POA: Diagnosis not present

## 2021-01-18 DIAGNOSIS — N186 End stage renal disease: Secondary | ICD-10-CM | POA: Diagnosis not present

## 2021-01-18 DIAGNOSIS — Z992 Dependence on renal dialysis: Secondary | ICD-10-CM | POA: Diagnosis not present

## 2021-01-21 DIAGNOSIS — N186 End stage renal disease: Secondary | ICD-10-CM | POA: Diagnosis not present

## 2021-01-21 DIAGNOSIS — Z992 Dependence on renal dialysis: Secondary | ICD-10-CM | POA: Diagnosis not present

## 2021-01-22 ENCOUNTER — Telehealth: Payer: Self-pay | Admitting: Podiatry

## 2021-01-22 ENCOUNTER — Telehealth: Payer: Self-pay | Admitting: *Deleted

## 2021-01-22 NOTE — Telephone Encounter (Signed)
[employer] called and stated that they sent a fax over to be completed, they are requesting some additional information.

## 2021-01-22 NOTE — Telephone Encounter (Signed)
The clinical nurse w/ [employer] [phone], ext. [phone]) is calling to inform that she was unable to process the Authorization -[id number] from June 8th - Sept. 5th,2022 for patient.  It will require a review by a medical doctor-Peer to Peer prior to June 16th.

## 2021-01-22 NOTE — Telephone Encounter (Signed)
Not sure where this form is. If Estill Bamberg could fill it out great, if not I'll have to fill it out when I get back next week.

## 2021-01-23 DIAGNOSIS — N186 End stage renal disease: Secondary | ICD-10-CM | POA: Diagnosis not present

## 2021-01-23 DIAGNOSIS — Z992 Dependence on renal dialysis: Secondary | ICD-10-CM | POA: Diagnosis not present

## 2021-01-24 DIAGNOSIS — E1143 Type 2 diabetes mellitus with diabetic autonomic (poly)neuropathy: Secondary | ICD-10-CM | POA: Diagnosis not present

## 2021-01-24 DIAGNOSIS — E1122 Type 2 diabetes mellitus with diabetic chronic kidney disease: Secondary | ICD-10-CM | POA: Diagnosis not present

## 2021-01-24 DIAGNOSIS — I132 Hypertensive heart and chronic kidney disease with heart failure and with stage 5 chronic kidney disease, or end stage renal disease: Secondary | ICD-10-CM | POA: Diagnosis not present

## 2021-01-24 DIAGNOSIS — I5032 Chronic diastolic (congestive) heart failure: Secondary | ICD-10-CM | POA: Diagnosis not present

## 2021-01-24 DIAGNOSIS — N186 End stage renal disease: Secondary | ICD-10-CM | POA: Diagnosis not present

## 2021-01-24 DIAGNOSIS — D631 Anemia in chronic kidney disease: Secondary | ICD-10-CM | POA: Diagnosis not present

## 2021-01-25 DIAGNOSIS — N186 End stage renal disease: Secondary | ICD-10-CM | POA: Diagnosis not present

## 2021-01-25 DIAGNOSIS — Z992 Dependence on renal dialysis: Secondary | ICD-10-CM | POA: Diagnosis not present

## 2021-01-28 DIAGNOSIS — N186 End stage renal disease: Secondary | ICD-10-CM | POA: Diagnosis not present

## 2021-01-28 DIAGNOSIS — Z992 Dependence on renal dialysis: Secondary | ICD-10-CM | POA: Diagnosis not present

## 2021-01-28 NOTE — Telephone Encounter (Signed)
Lexi, Clinical nurse with [employer] [facility] is calling to inform doctor that the authorization [id number]: [id number] for the patient has been denied by medical examiner, letter will follow. Any questions please call [phone].

## 2021-01-29 NOTE — Telephone Encounter (Signed)
Thx for the update. - Dr. Amalia Hailey

## 2021-01-30 DIAGNOSIS — E1143 Type 2 diabetes mellitus with diabetic autonomic (poly)neuropathy: Secondary | ICD-10-CM | POA: Diagnosis not present

## 2021-01-30 DIAGNOSIS — N186 End stage renal disease: Secondary | ICD-10-CM | POA: Diagnosis not present

## 2021-01-30 DIAGNOSIS — I5032 Chronic diastolic (congestive) heart failure: Secondary | ICD-10-CM | POA: Diagnosis not present

## 2021-01-30 DIAGNOSIS — D631 Anemia in chronic kidney disease: Secondary | ICD-10-CM | POA: Diagnosis not present

## 2021-01-30 DIAGNOSIS — E1122 Type 2 diabetes mellitus with diabetic chronic kidney disease: Secondary | ICD-10-CM | POA: Diagnosis not present

## 2021-01-30 DIAGNOSIS — I132 Hypertensive heart and chronic kidney disease with heart failure and with stage 5 chronic kidney disease, or end stage renal disease: Secondary | ICD-10-CM | POA: Diagnosis not present

## 2021-01-30 DIAGNOSIS — Z992 Dependence on renal dialysis: Secondary | ICD-10-CM | POA: Diagnosis not present

## 2021-01-31 DIAGNOSIS — R251 Tremor, unspecified: Secondary | ICD-10-CM | POA: Diagnosis not present

## 2021-01-31 NOTE — Telephone Encounter (Signed)
Butch Penny from [employer] called and stated that the patient was denied for a graft and that it could be appealed.  Phone: [phone] Fax:      [phone]

## 2021-02-01 DIAGNOSIS — Z992 Dependence on renal dialysis: Secondary | ICD-10-CM | POA: Diagnosis not present

## 2021-02-01 DIAGNOSIS — N186 End stage renal disease: Secondary | ICD-10-CM | POA: Diagnosis not present

## 2021-02-04 DIAGNOSIS — I259 Chronic ischemic heart disease, unspecified: Secondary | ICD-10-CM | POA: Diagnosis not present

## 2021-02-04 DIAGNOSIS — E119 Type 2 diabetes mellitus without complications: Secondary | ICD-10-CM | POA: Diagnosis not present

## 2021-02-04 DIAGNOSIS — N186 End stage renal disease: Secondary | ICD-10-CM | POA: Diagnosis not present

## 2021-02-04 DIAGNOSIS — Z794 Long term (current) use of insulin: Secondary | ICD-10-CM | POA: Diagnosis not present

## 2021-02-04 DIAGNOSIS — Z992 Dependence on renal dialysis: Secondary | ICD-10-CM | POA: Diagnosis not present

## 2021-02-06 DIAGNOSIS — Z992 Dependence on renal dialysis: Secondary | ICD-10-CM | POA: Diagnosis not present

## 2021-02-06 DIAGNOSIS — N186 End stage renal disease: Secondary | ICD-10-CM | POA: Diagnosis not present

## 2021-02-07 ENCOUNTER — Ambulatory Visit (INDEPENDENT_AMBULATORY_CARE_PROVIDER_SITE_OTHER): Payer: Medicare HMO | Admitting: Nurse Practitioner

## 2021-02-07 ENCOUNTER — Encounter (INDEPENDENT_AMBULATORY_CARE_PROVIDER_SITE_OTHER): Payer: Medicare HMO

## 2021-02-07 DIAGNOSIS — D631 Anemia in chronic kidney disease: Secondary | ICD-10-CM | POA: Diagnosis not present

## 2021-02-07 DIAGNOSIS — Z992 Dependence on renal dialysis: Secondary | ICD-10-CM | POA: Diagnosis not present

## 2021-02-07 DIAGNOSIS — I5032 Chronic diastolic (congestive) heart failure: Secondary | ICD-10-CM | POA: Diagnosis not present

## 2021-02-07 DIAGNOSIS — N186 End stage renal disease: Secondary | ICD-10-CM | POA: Diagnosis not present

## 2021-02-07 DIAGNOSIS — E1122 Type 2 diabetes mellitus with diabetic chronic kidney disease: Secondary | ICD-10-CM | POA: Diagnosis not present

## 2021-02-07 DIAGNOSIS — K219 Gastro-esophageal reflux disease without esophagitis: Secondary | ICD-10-CM | POA: Diagnosis not present

## 2021-02-07 DIAGNOSIS — E1143 Type 2 diabetes mellitus with diabetic autonomic (poly)neuropathy: Secondary | ICD-10-CM | POA: Diagnosis not present

## 2021-02-07 DIAGNOSIS — I132 Hypertensive heart and chronic kidney disease with heart failure and with stage 5 chronic kidney disease, or end stage renal disease: Secondary | ICD-10-CM | POA: Diagnosis not present

## 2021-02-07 DIAGNOSIS — E1165 Type 2 diabetes mellitus with hyperglycemia: Secondary | ICD-10-CM | POA: Diagnosis not present

## 2021-02-08 DIAGNOSIS — Z992 Dependence on renal dialysis: Secondary | ICD-10-CM | POA: Diagnosis not present

## 2021-02-08 DIAGNOSIS — N186 End stage renal disease: Secondary | ICD-10-CM | POA: Diagnosis not present

## 2021-02-11 DIAGNOSIS — R404 Transient alteration of awareness: Secondary | ICD-10-CM | POA: Diagnosis not present

## 2021-02-11 DIAGNOSIS — I10 Essential (primary) hypertension: Secondary | ICD-10-CM | POA: Diagnosis not present

## 2021-02-11 DIAGNOSIS — F209 Schizophrenia, unspecified: Secondary | ICD-10-CM | POA: Diagnosis not present

## 2021-02-11 DIAGNOSIS — R079 Chest pain, unspecified: Secondary | ICD-10-CM | POA: Diagnosis not present

## 2021-02-11 DIAGNOSIS — E1122 Type 2 diabetes mellitus with diabetic chronic kidney disease: Secondary | ICD-10-CM | POA: Diagnosis not present

## 2021-02-11 DIAGNOSIS — R1110 Vomiting, unspecified: Secondary | ICD-10-CM | POA: Diagnosis not present

## 2021-02-11 DIAGNOSIS — R112 Nausea with vomiting, unspecified: Secondary | ICD-10-CM | POA: Diagnosis not present

## 2021-02-11 DIAGNOSIS — R569 Unspecified convulsions: Secondary | ICD-10-CM | POA: Diagnosis not present

## 2021-02-11 DIAGNOSIS — Z992 Dependence on renal dialysis: Secondary | ICD-10-CM | POA: Diagnosis not present

## 2021-02-11 DIAGNOSIS — R0789 Other chest pain: Secondary | ICD-10-CM | POA: Diagnosis not present

## 2021-02-11 DIAGNOSIS — N186 End stage renal disease: Secondary | ICD-10-CM | POA: Diagnosis not present

## 2021-02-11 DIAGNOSIS — Z794 Long term (current) use of insulin: Secondary | ICD-10-CM | POA: Diagnosis not present

## 2021-02-11 DIAGNOSIS — R918 Other nonspecific abnormal finding of lung field: Secondary | ICD-10-CM | POA: Diagnosis not present

## 2021-02-11 DIAGNOSIS — R4182 Altered mental status, unspecified: Secondary | ICD-10-CM | POA: Diagnosis not present

## 2021-02-11 DIAGNOSIS — E785 Hyperlipidemia, unspecified: Secondary | ICD-10-CM | POA: Diagnosis not present

## 2021-02-11 DIAGNOSIS — I120 Hypertensive chronic kidney disease with stage 5 chronic kidney disease or end stage renal disease: Secondary | ICD-10-CM | POA: Diagnosis not present

## 2021-02-11 DIAGNOSIS — R1084 Generalized abdominal pain: Secondary | ICD-10-CM | POA: Diagnosis not present

## 2021-02-12 DIAGNOSIS — E118 Type 2 diabetes mellitus with unspecified complications: Secondary | ICD-10-CM | POA: Diagnosis not present

## 2021-02-12 DIAGNOSIS — M86179 Other acute osteomyelitis, unspecified ankle and foot: Secondary | ICD-10-CM | POA: Diagnosis not present

## 2021-02-12 DIAGNOSIS — N186 End stage renal disease: Secondary | ICD-10-CM | POA: Diagnosis not present

## 2021-02-12 DIAGNOSIS — N183 Chronic kidney disease, stage 3 unspecified: Secondary | ICD-10-CM | POA: Diagnosis not present

## 2021-02-12 DIAGNOSIS — Z992 Dependence on renal dialysis: Secondary | ICD-10-CM | POA: Diagnosis not present

## 2021-02-12 DIAGNOSIS — E1122 Type 2 diabetes mellitus with diabetic chronic kidney disease: Secondary | ICD-10-CM | POA: Diagnosis not present

## 2021-02-13 ENCOUNTER — Ambulatory Visit: Payer: Medicare HMO | Admitting: Podiatry

## 2021-02-13 DIAGNOSIS — Z20822 Contact with and (suspected) exposure to covid-19: Secondary | ICD-10-CM | POA: Diagnosis not present

## 2021-02-13 DIAGNOSIS — I120 Hypertensive chronic kidney disease with stage 5 chronic kidney disease or end stage renal disease: Secondary | ICD-10-CM | POA: Diagnosis not present

## 2021-02-13 DIAGNOSIS — S2241XA Multiple fractures of ribs, right side, initial encounter for closed fracture: Secondary | ICD-10-CM | POA: Diagnosis not present

## 2021-02-13 DIAGNOSIS — R1111 Vomiting without nausea: Secondary | ICD-10-CM | POA: Diagnosis not present

## 2021-02-13 DIAGNOSIS — Z992 Dependence on renal dialysis: Secondary | ICD-10-CM | POA: Diagnosis not present

## 2021-02-13 DIAGNOSIS — E119 Type 2 diabetes mellitus without complications: Secondary | ICD-10-CM | POA: Diagnosis not present

## 2021-02-13 DIAGNOSIS — K4090 Unilateral inguinal hernia, without obstruction or gangrene, not specified as recurrent: Secondary | ICD-10-CM | POA: Diagnosis not present

## 2021-02-13 DIAGNOSIS — N186 End stage renal disease: Secondary | ICD-10-CM | POA: Diagnosis not present

## 2021-02-13 DIAGNOSIS — M8448XA Pathological fracture, other site, initial encounter for fracture: Secondary | ICD-10-CM | POA: Diagnosis not present

## 2021-02-13 DIAGNOSIS — N3289 Other specified disorders of bladder: Secondary | ICD-10-CM | POA: Diagnosis not present

## 2021-02-13 DIAGNOSIS — R112 Nausea with vomiting, unspecified: Secondary | ICD-10-CM | POA: Diagnosis not present

## 2021-02-13 DIAGNOSIS — W19XXXA Unspecified fall, initial encounter: Secondary | ICD-10-CM | POA: Diagnosis not present

## 2021-02-13 DIAGNOSIS — R9431 Abnormal electrocardiogram [ECG] [EKG]: Secondary | ICD-10-CM | POA: Diagnosis not present

## 2021-02-15 DIAGNOSIS — N186 End stage renal disease: Secondary | ICD-10-CM | POA: Diagnosis not present

## 2021-02-15 DIAGNOSIS — Z992 Dependence on renal dialysis: Secondary | ICD-10-CM | POA: Diagnosis not present

## 2021-02-17 ENCOUNTER — Other Ambulatory Visit: Payer: Self-pay

## 2021-02-17 ENCOUNTER — Encounter (HOSPITAL_COMMUNITY): Payer: Self-pay | Admitting: Emergency Medicine

## 2021-02-17 ENCOUNTER — Emergency Department (HOSPITAL_COMMUNITY)
Admission: EM | Admit: 2021-02-17 | Discharge: 2021-02-17 | Disposition: A | Discharge status: Home / Self Care | Payer: Medicare HMO | Attending: Emergency Medicine | Admitting: Emergency Medicine

## 2021-02-17 DIAGNOSIS — Z79899 Other long term (current) drug therapy: Secondary | ICD-10-CM | POA: Diagnosis not present

## 2021-02-17 DIAGNOSIS — K219 Gastro-esophageal reflux disease without esophagitis: Secondary | ICD-10-CM | POA: Insufficient documentation

## 2021-02-17 DIAGNOSIS — R1013 Epigastric pain: Secondary | ICD-10-CM | POA: Diagnosis not present

## 2021-02-17 DIAGNOSIS — R0789 Other chest pain: Secondary | ICD-10-CM | POA: Diagnosis not present

## 2021-02-17 DIAGNOSIS — Z992 Dependence on renal dialysis: Secondary | ICD-10-CM | POA: Diagnosis not present

## 2021-02-17 DIAGNOSIS — Z8616 Personal history of COVID-19: Secondary | ICD-10-CM | POA: Insufficient documentation

## 2021-02-17 DIAGNOSIS — R112 Nausea with vomiting, unspecified: Secondary | ICD-10-CM | POA: Diagnosis not present

## 2021-02-17 DIAGNOSIS — R110 Nausea: Secondary | ICD-10-CM | POA: Diagnosis not present

## 2021-02-17 DIAGNOSIS — I10 Essential (primary) hypertension: Secondary | ICD-10-CM | POA: Diagnosis not present

## 2021-02-17 DIAGNOSIS — R1084 Generalized abdominal pain: Secondary | ICD-10-CM | POA: Diagnosis not present

## 2021-02-17 DIAGNOSIS — N186 End stage renal disease: Secondary | ICD-10-CM | POA: Insufficient documentation

## 2021-02-17 DIAGNOSIS — E1122 Type 2 diabetes mellitus with diabetic chronic kidney disease: Secondary | ICD-10-CM | POA: Insufficient documentation

## 2021-02-17 DIAGNOSIS — R079 Chest pain, unspecified: Secondary | ICD-10-CM | POA: Diagnosis not present

## 2021-02-17 DIAGNOSIS — I132 Hypertensive heart and chronic kidney disease with heart failure and with stage 5 chronic kidney disease, or end stage renal disease: Secondary | ICD-10-CM | POA: Insufficient documentation

## 2021-02-17 DIAGNOSIS — I5030 Unspecified diastolic (congestive) heart failure: Secondary | ICD-10-CM | POA: Diagnosis not present

## 2021-02-17 LAB — I-STAT CHEM 8, ED
BUN: 38 mg/dL — ABNORMAL HIGH (ref 6–20)
Calcium, Ion: 1.11 mmol/L — ABNORMAL LOW (ref 1.15–1.40)
Chloride: 101 mmol/L (ref 98–111)
Creatinine, Ser: 6.4 mg/dL — ABNORMAL HIGH (ref 0.61–1.24)
Glucose, Bld: 86 mg/dL (ref 70–99)
HCT: 31 % — ABNORMAL LOW (ref 39.0–52.0)
Hemoglobin: 10.5 g/dL — ABNORMAL LOW (ref 13.0–17.0)
Potassium: 3.4 mmol/L — ABNORMAL LOW (ref 3.5–5.1)
Sodium: 137 mmol/L (ref 135–145)
TCO2: 23 mmol/L (ref 22–32)

## 2021-02-17 MED ORDER — ONDANSETRON 4 MG PO TBDP
4.0000 mg | ORAL_TABLET | Freq: Once | ORAL | Status: AC
  Administered 2021-02-17: 4 mg via ORAL
  Filled 2021-02-17: qty 1

## 2021-02-17 NOTE — ED Provider Notes (Signed)
[facility] EMERGENCY DEPARTMENT Provider Note   CSN: [id number] Arrival date & time: 02/17/21  1513     History No chief complaint on file.   Timothy Clark is a 50 y.o. male.  HPI  This patient is a 50 year old male, he has a history of congestive heart failure end-stage renal disease on dialysis Monday Wednesday and Friday, he also has a history of gastroparesis with recurrent and chronic nausea and vomiting.  The patient has not missed any episodes of dialysis, he has been to each session, he denies fevers or chills, denies coughing or shortness of breath, denies swelling of the legs.  At this time he complains of epigastric discomfort.  It has been on for approximately 24 hours, on and off throughout that timeframe, nothing seems to make it better or worse.  Past Medical History:  Diagnosis Date   Anemia    Anxiety    CHF (congestive heart failure) (HCC)    diastolic dysfunction   Chronic abdominal pain    COVID-19 07/2019   Depression    Esophagitis    ESRD on hemodialysis ([place])    undergoing evaluation for transplant   Eye hemorrhage    Gastritis    Gastroparesis    GERD (gastroesophageal reflux disease)    Glaucoma    Helicobacter pylori (H. pylori) infection 2016   ERADICATION DOCUMENTED VIA STOOL TEST in AUG 2016 at [facility]   Hiccups    Hypertension    Nausea and vomiting    chronic, recurrent   Neuropathy    feet   Renal insufficiency    Type 2 diabetes mellitus with complications ([place])    diagnosed around age 36   Vitreous hemorrhage of left eye ([place]) 12/15/2019    Patient Active Problem List   Diagnosis Date Noted   Seizures ([place]) 01/02/2021   Seizure ([place]) 01/02/2021   Problem with vascular access 09/17/2020   Bipolar disorder ([place]) 09/17/2020   Acute encephalopathy 07/24/2020   Intractable abdominal pain 07/23/2020   Encephalopathy    Colon cancer screening 07/12/2020   Optic atrophy, left eye 07/03/2020   Stable treated proliferative  diabetic retinopathy of right eye determined by examination associated with type 2 diabetes mellitus ([place]) 07/03/2020   Anxiety 03/13/2020   History of COVID-19 03/13/2020   History of gastroesophageal reflux (GERD) 82/99/3716   History of Helicobacter pylori infection 03/13/2020   Pre-transplant evaluation for kidney transplant 03/13/2020   Renovascular hypertension 03/13/2020   Hematemesis 03/02/2020   Coffee ground emesis 96/78/9381   Complication of vascular access for dialysis 01/29/2020   Follow-up examination after eye surgery 12/15/2019   Type 2 diabetes with nephropathy ([place]) 12/15/2019   Controlled diabetes mellitus with stable proliferative retinopathy of left eye, with long-term current use of insulin ([place]) 12/15/2019   Secondary glaucoma due to combination mechanisms, left, severe stage 12/15/2019   Nausea with vomiting 12/08/2019   Hyperglycemia due to type 2 diabetes mellitus ([place]) 10/28/2019   DKA (diabetic ketoacidoses) 10/27/2019   ESRD on dialysis ([place]) 06/26/2019   Constipation 03/15/2019   Chronic renal insufficiency, stage V ([place]) 02/21/2019   Type 2 diabetes mellitus with proliferative retinopathy ([place]) 10/25/2018   Acute renal failure superimposed on stage 4 chronic kidney disease ([place]) 10/25/2018   Extravasation injury of IV catheter site with other complication, initial encounter ([place])    Intractable vomiting    Chronic kidney disease    Poor venous access    Gastroparesis 10/24/2018   Closed displaced fracture of  lateral end of right clavicle 06/18/2018   HTN (hypertension), malignant 06/13/2018   Scrotal swelling 11/10/2017   Swelling 11/09/2017   Scrotal edema    Obesity, Class III, BMI 40-49.9 (morbid obesity) ([place]) 11/02/2017   CKD stage 3 due to type 2 diabetes mellitus ([place]) 10/30/2017   Wound eschars of foot, right  10/30/2017   Chronic abdominal pain 09/21/2017   Hypertensive urgency 09/21/2017   Prolonged QT interval 09/21/2017   GERD  (gastroesophageal reflux disease) 07/14/2017   Glaucoma 07/14/2017   Anemia 07/14/2017   Pressure injury of skin 02/01/2017   Insulin dependent diabetes mellitus 09/07/2016   Orthostatic syncope 09/05/2016   Dumping syndrome 04/22/2016   Gastroparesis due to DM ([place]) 02/06/2015   Essential hypertension 12/06/2014   Diabetic macular edema ([place]) 02/18/2014   PDR (proliferative diabetic retinopathy) ([place]) 02/06/2014   Neovascular glaucoma of left eye 01/11/2014   Diabetic infection of left heel ([place]) 02/14/2010   UNSPECIFIED PERIPHERAL VASCULAR DISEASE 10/19/2009   ERECTILE DYSFUNCTION, ORGANIC 10/19/2009   NUMBNESS 07/20/2009   Diabetes mellitus type 2 with complications, uncontrolled ([place]) 03/22/2009   Hyperlipidemia 03/22/2009   Depression 03/22/2009    Past Surgical History:  Procedure Laterality Date   AMPUTATION TOE Right    great toe   AV FISTULA INSERTION W/ RF MAGNETIC GUIDANCE Left 04/06/2019   Procedure: AV FISTULA INSERTION W/RF MAGNETIC GUIDANCE;  Surgeon: Katha Cabal, MD;  Location: [facility];  Service: Cardiovascular;  Laterality: Left;   AV FISTULA PLACEMENT Left 05/06/2019   Procedure: INSERTION OF ARTERIOVENOUS (AV) GORE-TEX GRAFT ARM ( FOREARM LOOP );  Surgeon: Katha Cabal, MD;  Location: [facility];  Service: Vascular;  Laterality: Left;   AV FISTULA PLACEMENT Left 03/21/2020   Procedure: INSERTION OF ARTERIOVENOUS (AV) GORE-TEX GRAFT ARM ( BRACHIAL AXILLARY );  Surgeon: Katha Cabal, MD;  Location: [facility];  Service: Vascular;  Laterality: Left;   CATARACT EXTRACTION W/PHACO Left 05/19/2016   Procedure: CATARACT EXTRACTION PHACO AND INTRAOCULAR LENS PLACEMENT LEFT EYE;  Surgeon: Tonny Branch, MD;  Location: [facility];  Service: Ophthalmology;  Laterality: Left;  CDE: 7.30   CATARACT EXTRACTION W/PHACO Right 06/23/2016   Procedure: CATARACT EXTRACTION PHACO AND INTRAOCULAR LENS PLACEMENT RIGHT EYE CDE=9.87;  Surgeon: Tonny Branch, MD;  Location:  [facility];  Service: Ophthalmology;  Laterality: Right;  right   DIALYSIS/PERMA CATHETER REMOVAL N/A 07/19/2019   Procedure: DIALYSIS/PERMA CATHETER REMOVAL;  Surgeon: Katha Cabal, MD;  Location: [facility];  Service: Cardiovascular;  Laterality: N/A;   ESOPHAGOGASTRODUODENOSCOPY  2015   Dr. Britta Mccreedy   ESOPHAGOGASTRODUODENOSCOPY N/A 10/26/2014   RMR: Distal esophagititis-likely reflux related although an element of pill induced injuruy no exclueded  status post biopsy. Diffusely abnormal gastric mucosa of uncertain signigicane -status post gastric biopsy. Focal area of excoriation in the cardia most consistant with a trauma of heaving.    ESOPHAGOGASTRODUODENOSCOPY  08/2015   [facility]: mild esophagitis and old gastric contents   EYE SURGERY Left 2015   stent placed to left eye   EYE SURGERY Right 06/2019   per patient- to remove scar tissue   INCISION AND DRAINAGE OF WOUND Right 07/16/2017   Procedure: IRRIGATION AND DEBRIDEMENT OF SOFT TISSUE OF ULCERATION RIGHT FOOT;  Surgeon: Caprice [place], DPM;  Location: [facility];  Service: Podiatry;  Laterality: Right;   IR THROMBECTOMY AV FISTULA W/THROMBOLYSIS/PTA INC/SHUNT/IMG LEFT Left 09/19/2020   IR US GUIDE VASC ACCESS LEFT  09/19/2020   PERIPHERAL VASCULAR THROMBECTOMY Left  09/27/2019   Procedure: PERIPHERAL VASCULAR THROMBECTOMY;  Surgeon: Katha Cabal, MD;  Location: [facility];  Service: Cardiovascular;  Laterality: Left;   PERIPHERAL VASCULAR THROMBECTOMY Left 10/27/2019   Procedure: PERIPHERAL VASCULAR THROMBECTOMY;  Surgeon: Algernon Huxley, MD;  Location: [facility];  Service: Cardiovascular;  Laterality: Left;   PERIPHERAL VASCULAR THROMBECTOMY Left 12/13/2019   Procedure: PERIPHERAL VASCULAR THROMBECTOMY;  Surgeon: Katha Cabal, MD;  Location: [facility];  Service: Cardiovascular;  Laterality: Left;       Family History  Problem Relation Age of Onset   Ovarian cancer Mother    Heart attack  Father    Colon polyps Father    Cervical cancer Sister    Diabetes Sister    Colon cancer Neg Hx     Social History   Tobacco Use   Smoking status: Never   Smokeless tobacco: Never  Vaping Use   Vaping Use: Never used  Substance Use Topics   Alcohol use: No    Alcohol/week: 0.0 standard drinks   Drug use: No    Home Medications Prior to Admission medications   Medication Sig Start Date End Date Taking? Authorizing Provider  ALPRAZolam Duanne Moron) 0.5 MG tablet Take 0.5 mg by mouth 2 (two) times daily as needed for anxiety.   Yes [provider]  amLODipine (NORVASC) 5 MG tablet Take 5 mg by mouth daily.   Yes [provider]  atorvastatin (LIPITOR) 10 MG tablet Take 1 tablet (10 mg total) by mouth at bedtime. 10/13/20 02/17/21 Yes Johnson, Clanford L, MD  brimonidine (ALPHAGAN) 0.15 % ophthalmic solution Place 1 drop into both eyes in the morning and at bedtime.    Yes [provider]  dicyclomine (BENTYL) 10 MG capsule Take 10 mg by mouth 4 (four) times daily -  before meals and at bedtime.   Yes [provider]  divalproex (DEPAKOTE) 250 MG DR tablet Take 750 mg by mouth 3 (three) times daily.   Yes [provider]  dorzolamide-timolol (COSOPT) 22.3-6.8 MG/ML ophthalmic solution Place 1 drop into both eyes 2 (two) times daily.  01/16/20  Yes [provider]  famotidine (PEPCID) 20 MG tablet Take 20 mg by mouth 2 (two) times daily.   Yes [provider]  folic acid (FOLVITE) 1 MG tablet Take 1 mg by mouth daily.   Yes [provider]  Glucagon (GVOKE HYPOPEN 2-PACK) 1 MG/0.2ML SOAJ Inject 1 Syringe into the skin as needed (hypoglycemia).   Yes [provider]  Iron-FA-B Cmp-C-Biot-Probiotic (FUSION PLUS PO) Take 1 capsule by mouth daily.   Yes [provider]  metoCLOPramide (REGLAN) 10 MG tablet Take 10 mg by mouth every 6 (six) hours as needed for nausea or vomiting.   Yes [provider]   NOVOLOG MIX 70/30 FLEXPEN (70-30) 100 UNIT/ML FlexPen Inject 35-45 Units into the skin See admin instructions. Inject 45 units in the morning and 35 every evening 02/06/21  Yes [provider]  ondansetron (ZOFRAN-ODT) 4 MG disintegrating tablet Take 4 mg by mouth every 8 (eight) hours as needed for nausea or vomiting.   Yes [provider]  pantoprazole (PROTONIX) 40 MG tablet Take 1 tablet (40 mg total) by mouth 2 (two) times daily before a meal. 08/24/18  Yes Annitta Needs, NP  senna-docusate (SENOKOT-S) 8.6-50 MG tablet Take 1 tablet by mouth 2 (two) times daily. 10/13/20  Yes Johnson, Clanford L, MD  sitaGLIPtin (JANUVIA) 25 MG tablet  Take 25 mg by mouth daily.    Yes [provider]  sucralfate (CARAFATE) 1 g tablet Take 1 g by mouth 4 (four) times daily.   Yes [provider]  torsemide (DEMADEX) 20 MG tablet Take 20 mg by mouth daily. At noon   Yes [provider]  valproic acid (DEPAKENE) 250 MG capsule Take 750 mg by mouth 3 (three) times daily.   Yes [provider]  ziprasidone (GEODON) 20 MG capsule Take 1 tab with breakfast and 2 tabs at HS Patient taking differently: Take 20-40 mg by mouth See admin instructions. Take 20 mg capsule with breakfast and 40 mg at bedtime 10/13/20  Yes Johnson, Clanford L, MD  divalproex (DEPAKOTE) 500 MG DR tablet  08/30/20   [provider]  gentamicin cream (GARAMYCIN) 0.1 % Apply 1 application topically 2 (two) times daily. Patient not taking: No sig reported 12/12/20   Edrick Kins, DPM  LEADER UNIFINE PENTIPS PLUS 32G X 4 MM MISC  05/17/20   [provider]  linezolid (ZYVOX) 600 MG tablet linezolid 600 mg tablet  TAKE 1 TABLET BY MOUTH EVERY 12 HOURS Patient not taking: No sig reported    [provider]  polyethylene glycol (MIRALAX / GLYCOLAX) 17 g packet Take 17 g by mouth daily as needed for mild constipation. Patient not taking: No sig reported 10/13/20   Irwin Brakeman L, MD  tiZANidine (ZANAFLEX) 2 MG tablet Take 2 mg by mouth See admin instructions. Take 1 tablet by mouth every 6 to 8 hours for pain/muscle spams Patient not taking: No sig reported    [provider]    Allergies    Donnatal [phenobarbital-belladonna alk], Fish allergy, Haldol [haloperidol lactate], Lidocaine, Maalox [calcium carbonate antacid], Shellfish allergy, Aspirin, Doxycycline, Keflex [cephalexin], Marinol [dronabinol], Other, Penicillins, and Bactrim [sulfamethoxazole-trimethoprim]  Review of Systems   Review of Systems  All other systems reviewed and are negative.  Physical Exam Updated Vital Signs BP (!) 155/88   Pulse 91   Temp 98.2 F (36.8 C) (Oral)   Resp 19   Ht 1.829 m (6')   Wt 127 kg   SpO2 100%   BMI 37.97 kg/m   Physical Exam Vitals and nursing note reviewed.  Constitutional:      General: He is not in acute distress.    Appearance: He is well-developed.  HENT:     Head: Normocephalic and atraumatic.     Mouth/Throat:     Pharynx: No oropharyngeal exudate.  Eyes:     General:        Right eye: No discharge.        Left eye: No discharge.     Comments: Bilateral blindness  Neck:     Thyroid: No thyromegaly.     Vascular: No JVD.  Cardiovascular:     Rate and Rhythm: Normal rate and regular rhythm.     Heart sounds: Normal heart sounds. No murmur heard.   No friction rub. No gallop.     Comments: Fistula in the left upper extremity has a good thrill without any overlying redness tenderness or warmth Pulmonary:     Effort: Pulmonary effort is normal. No respiratory distress.     Breath sounds: Normal breath sounds. No wheezing or rales.  Abdominal:     General: Bowel sounds are normal. There is no distension.     Palpations: Abdomen is soft. There is no mass.     Tenderness: There is abdominal tenderness.  Comments: The patient has a benign abdomen with minimal tenderness in the epigastric area, he has no guarding or  peritoneal signs, no masses, no pulsating masses, no redness, no rigidity at all  Musculoskeletal:        General: No tenderness. Normal range of motion.     Cervical back: Normal range of motion and neck supple.     Right lower leg: No edema.     Left lower leg: No edema.  Lymphadenopathy:     Cervical: No cervical adenopathy.  Skin:    General: Skin is warm and dry.     Findings: No erythema or rash.  Neurological:     Mental Status: He is alert.     Coordination: Coordination normal.  Psychiatric:        Behavior: Behavior normal.    ED Results / Procedures / Treatments   Labs (all labs ordered are listed, but only abnormal results are displayed) Labs Reviewed  I-STAT CHEM 8, ED - Abnormal; Notable for the following components:      Result Value   Potassium 3.4 (*)    BUN 38 (*)    Creatinine, Ser 6.40 (*)    Calcium, Ion 1.11 (*)    Hemoglobin 10.5 (*)    HCT 31.0 (*)    All other components within normal limits    EKG EKG Interpretation  Date/Time:  Sunday February 17 2021 16:36:44 EDT Ventricular Rate:  86 PR Interval:  172 QRS Duration: 78 QT Interval:  387 QTC Calculation: 463 R Axis:   1 Text Interpretation: Sinus rhythm Multiple ventricular premature complexes Low voltage, precordial leads Anteroseptal infarct, old Confirmed by Noemi Chapel [phone]) on 02/17/2021 4:38:49 PM  Radiology No results found.  Procedures Procedures   Medications Ordered in ED Medications  ondansetron (ZOFRAN-ODT) disintegrating tablet 4 mg (4 mg Oral Given 02/17/21 1635)    ED Course  I have reviewed the triage vital signs and the nursing notes.  Pertinent labs & imaging results that were available during my care of the patient were reviewed by me and considered in my medical decision making (see chart for details).    MDM Rules/Calculators/A&P                          Essentially no significant abdominal tenderness but chronic nausea and vomiting.  The patient will be  given some medications to help, check some basic labs given that he is 2 days from dialysis make sure he is not hyperkalemic  Pt has been able to tolerate PO after single dose of zofran He has reassuring labs - no elevated K, Pt is requesting d/c - I think this is reasonable since he is better No other high risk features that I can see at this time.  Final Clinical Impression(s) / ED Diagnoses Final diagnoses:  Non-intractable vomiting with nausea, unspecified vomiting type    Rx / DC Orders ED Discharge Orders     None        Noemi Chapel, MD 02/17/21 1940

## 2021-02-17 NOTE — Discharge Instructions (Addendum)
Your testing has been reassuring, Please go to dialysis tomorrow at your scheduled time ER for worsening symptoms.

## 2021-02-17 NOTE — ED Triage Notes (Signed)
Pt c/o abd pain, N/V starting this morning. Per EMS pt is dialysis pt, partially blind and can be combative. EMS states pt has hx of gastroparesis.

## 2021-02-18 DIAGNOSIS — Z992 Dependence on renal dialysis: Secondary | ICD-10-CM | POA: Diagnosis not present

## 2021-02-18 DIAGNOSIS — N186 End stage renal disease: Secondary | ICD-10-CM | POA: Diagnosis not present

## 2021-02-20 DIAGNOSIS — N186 End stage renal disease: Secondary | ICD-10-CM | POA: Diagnosis not present

## 2021-02-20 DIAGNOSIS — Z992 Dependence on renal dialysis: Secondary | ICD-10-CM | POA: Diagnosis not present

## 2021-02-21 ENCOUNTER — Encounter (INDEPENDENT_AMBULATORY_CARE_PROVIDER_SITE_OTHER): Payer: Medicare HMO

## 2021-02-21 ENCOUNTER — Ambulatory Visit (INDEPENDENT_AMBULATORY_CARE_PROVIDER_SITE_OTHER): Payer: Medicare HMO | Admitting: Nurse Practitioner

## 2021-02-22 DIAGNOSIS — N186 End stage renal disease: Secondary | ICD-10-CM | POA: Diagnosis not present

## 2021-02-22 DIAGNOSIS — Z992 Dependence on renal dialysis: Secondary | ICD-10-CM | POA: Diagnosis not present

## 2021-02-25 ENCOUNTER — Other Ambulatory Visit: Payer: Self-pay

## 2021-02-25 ENCOUNTER — Ambulatory Visit (INDEPENDENT_AMBULATORY_CARE_PROVIDER_SITE_OTHER): Payer: Medicare HMO | Admitting: Podiatry

## 2021-02-25 DIAGNOSIS — E08621 Diabetes mellitus due to underlying condition with foot ulcer: Secondary | ICD-10-CM | POA: Diagnosis not present

## 2021-02-25 DIAGNOSIS — Z992 Dependence on renal dialysis: Secondary | ICD-10-CM | POA: Diagnosis not present

## 2021-02-25 DIAGNOSIS — L97522 Non-pressure chronic ulcer of other part of left foot with fat layer exposed: Secondary | ICD-10-CM

## 2021-02-25 DIAGNOSIS — N186 End stage renal disease: Secondary | ICD-10-CM | POA: Diagnosis not present

## 2021-02-25 NOTE — Progress Notes (Signed)
Subjective:  51 y.o. male with PMHx of diabetes mellitus, ESRD, presenting for follow-up evaluation of an ulcer that is developed to the left hallux.  Patient states that the nurse has not come out to the house for the last 2 weeks now.  They have been changing dressings at home.  No new complaints at this time  Past Medical History:  Diagnosis Date   Anemia    Anxiety    CHF (congestive heart failure) (HCC)    diastolic dysfunction   Chronic abdominal pain    COVID-19 07/2019   Depression    Esophagitis    ESRD on hemodialysis ([place])    undergoing evaluation for transplant   Eye hemorrhage    Gastritis    Gastroparesis    GERD (gastroesophageal reflux disease)    Glaucoma    Helicobacter pylori (H. pylori) infection 2016   ERADICATION DOCUMENTED VIA STOOL TEST in AUG 2016 at [facility]   Hiccups    Hypertension    Nausea and vomiting    chronic, recurrent   Neuropathy    feet   Renal insufficiency    Type 2 diabetes mellitus with complications ([place])    diagnosed around age 53   Vitreous hemorrhage of left eye ([place]) 12/15/2019     Objective/Physical Exam General: The patient is alert and oriented x3 in no acute distress.  Dermatology:  Wound #1 noted to the left hallux measuring approximately 0.8 x 0.8 x 0.2 cm (LxWxD).   To the noted ulceration(s), there is no eschar. There is a moderate amount of slough, fibrin, and necrotic tissue noted. Granulation tissue and wound base is red. There is a minimal amount of serosanguineous drainage noted. There is no exposed bone muscle-tendon ligament or joint. There is no malodor. Periwound integrity is intact. Skin is warm, dry and supple bilateral lower extremities.  Thickened, hyperkeratotic, dystrophic, elongated nails also noted bilateral  Vascular: Palpable pedal pulses bilaterally. No edema or erythema noted. Capillary refill within normal limits.  Neurological: Epicritic and protective threshold diminished bilaterally.    Musculoskeletal Exam: History of partial first ray amputation right foot  Assessment: 1.  Ulcer left hallux  secondary to diabetes mellitus 2. diabetes mellitus w/ peripheral neuropathy 3. H/o partial first ray amputation right foot   Plan of Care:  1. Patient was evaluated.  2. medically necessary excisional debridement including subcutaneous tissue was performed using a tissue nipper and a chisel blade. Excisional debridement of all the necrotic nonviable tissue down to healthy bleeding viable tissue was performed with post-debridement measurements same as pre-. 3. the wound was cleansed and dry sterile dressing applied. 4.  [facility] apparently discontinued dressing changes at the house because they need further orders.  New orders will be sent to them today.  Orders: Cleanse with normal saline.  Apply gentamicin cream with a light dressing 2x/week 5.  Continue diabetic shoes and insoles with the felt offloading dancers pads 6.  Return to clinic 6 weeks 7.  Patient was denied skin graft application  Edrick Kins, DPM [facility]  Dr. Edrick Kins, DPM    [address], [place] [address]                Office [phone]  Fax [phone]

## 2021-02-27 DIAGNOSIS — Z992 Dependence on renal dialysis: Secondary | ICD-10-CM | POA: Diagnosis not present

## 2021-02-27 DIAGNOSIS — N186 End stage renal disease: Secondary | ICD-10-CM | POA: Diagnosis not present

## 2021-03-01 DIAGNOSIS — Z992 Dependence on renal dialysis: Secondary | ICD-10-CM | POA: Diagnosis not present

## 2021-03-01 DIAGNOSIS — N186 End stage renal disease: Secondary | ICD-10-CM | POA: Diagnosis not present

## 2021-03-04 DIAGNOSIS — Z992 Dependence on renal dialysis: Secondary | ICD-10-CM | POA: Diagnosis not present

## 2021-03-04 DIAGNOSIS — N186 End stage renal disease: Secondary | ICD-10-CM | POA: Diagnosis not present

## 2021-03-06 DIAGNOSIS — N186 End stage renal disease: Secondary | ICD-10-CM | POA: Diagnosis not present

## 2021-03-06 DIAGNOSIS — Z992 Dependence on renal dialysis: Secondary | ICD-10-CM | POA: Diagnosis not present

## 2021-03-08 DIAGNOSIS — Z992 Dependence on renal dialysis: Secondary | ICD-10-CM | POA: Diagnosis not present

## 2021-03-08 DIAGNOSIS — N186 End stage renal disease: Secondary | ICD-10-CM | POA: Diagnosis not present

## 2021-03-10 DIAGNOSIS — E1165 Type 2 diabetes mellitus with hyperglycemia: Secondary | ICD-10-CM | POA: Diagnosis not present

## 2021-03-10 DIAGNOSIS — Z992 Dependence on renal dialysis: Secondary | ICD-10-CM | POA: Diagnosis not present

## 2021-03-10 DIAGNOSIS — K219 Gastro-esophageal reflux disease without esophagitis: Secondary | ICD-10-CM | POA: Diagnosis not present

## 2021-03-10 DIAGNOSIS — N186 End stage renal disease: Secondary | ICD-10-CM | POA: Diagnosis not present

## 2021-03-11 DIAGNOSIS — N186 End stage renal disease: Secondary | ICD-10-CM | POA: Diagnosis not present

## 2021-03-11 DIAGNOSIS — Z992 Dependence on renal dialysis: Secondary | ICD-10-CM | POA: Diagnosis not present

## 2021-03-13 DIAGNOSIS — Z992 Dependence on renal dialysis: Secondary | ICD-10-CM | POA: Diagnosis not present

## 2021-03-13 DIAGNOSIS — N186 End stage renal disease: Secondary | ICD-10-CM | POA: Diagnosis not present

## 2021-03-14 DIAGNOSIS — I132 Hypertensive heart and chronic kidney disease with heart failure and with stage 5 chronic kidney disease, or end stage renal disease: Secondary | ICD-10-CM | POA: Diagnosis not present

## 2021-03-14 DIAGNOSIS — F209 Schizophrenia, unspecified: Secondary | ICD-10-CM | POA: Diagnosis not present

## 2021-03-14 DIAGNOSIS — E1165 Type 2 diabetes mellitus with hyperglycemia: Secondary | ICD-10-CM | POA: Diagnosis not present

## 2021-03-14 DIAGNOSIS — E782 Mixed hyperlipidemia: Secondary | ICD-10-CM | POA: Diagnosis not present

## 2021-03-14 DIAGNOSIS — F319 Bipolar disorder, unspecified: Secondary | ICD-10-CM | POA: Diagnosis not present

## 2021-03-14 DIAGNOSIS — I10 Essential (primary) hypertension: Secondary | ICD-10-CM | POA: Diagnosis not present

## 2021-03-14 DIAGNOSIS — R569 Unspecified convulsions: Secondary | ICD-10-CM | POA: Diagnosis not present

## 2021-03-14 DIAGNOSIS — R531 Weakness: Secondary | ICD-10-CM | POA: Diagnosis not present

## 2021-03-14 DIAGNOSIS — D631 Anemia in chronic kidney disease: Secondary | ICD-10-CM | POA: Diagnosis not present

## 2021-03-15 DIAGNOSIS — E118 Type 2 diabetes mellitus with unspecified complications: Secondary | ICD-10-CM | POA: Diagnosis not present

## 2021-03-15 DIAGNOSIS — N186 End stage renal disease: Secondary | ICD-10-CM | POA: Diagnosis not present

## 2021-03-15 DIAGNOSIS — M86179 Other acute osteomyelitis, unspecified ankle and foot: Secondary | ICD-10-CM | POA: Diagnosis not present

## 2021-03-15 DIAGNOSIS — Z992 Dependence on renal dialysis: Secondary | ICD-10-CM | POA: Diagnosis not present

## 2021-03-15 DIAGNOSIS — E1122 Type 2 diabetes mellitus with diabetic chronic kidney disease: Secondary | ICD-10-CM | POA: Diagnosis not present

## 2021-03-15 DIAGNOSIS — N183 Chronic kidney disease, stage 3 unspecified: Secondary | ICD-10-CM | POA: Diagnosis not present

## 2021-03-18 DIAGNOSIS — N186 End stage renal disease: Secondary | ICD-10-CM | POA: Diagnosis not present

## 2021-03-18 DIAGNOSIS — Z992 Dependence on renal dialysis: Secondary | ICD-10-CM | POA: Diagnosis not present

## 2021-03-20 DIAGNOSIS — N186 End stage renal disease: Secondary | ICD-10-CM | POA: Diagnosis not present

## 2021-03-20 DIAGNOSIS — Z992 Dependence on renal dialysis: Secondary | ICD-10-CM | POA: Diagnosis not present

## 2021-03-22 DIAGNOSIS — Z992 Dependence on renal dialysis: Secondary | ICD-10-CM | POA: Diagnosis not present

## 2021-03-22 DIAGNOSIS — N186 End stage renal disease: Secondary | ICD-10-CM | POA: Diagnosis not present

## 2021-03-25 DIAGNOSIS — N186 End stage renal disease: Secondary | ICD-10-CM | POA: Diagnosis not present

## 2021-03-25 DIAGNOSIS — Z992 Dependence on renal dialysis: Secondary | ICD-10-CM | POA: Diagnosis not present

## 2021-03-27 DIAGNOSIS — N186 End stage renal disease: Secondary | ICD-10-CM | POA: Diagnosis not present

## 2021-03-27 DIAGNOSIS — Z992 Dependence on renal dialysis: Secondary | ICD-10-CM | POA: Diagnosis not present

## 2021-03-29 DIAGNOSIS — N186 End stage renal disease: Secondary | ICD-10-CM | POA: Diagnosis not present

## 2021-03-29 DIAGNOSIS — Z992 Dependence on renal dialysis: Secondary | ICD-10-CM | POA: Diagnosis not present

## 2021-03-30 DIAGNOSIS — R1084 Generalized abdominal pain: Secondary | ICD-10-CM | POA: Diagnosis not present

## 2021-03-30 DIAGNOSIS — W19XXXA Unspecified fall, initial encounter: Secondary | ICD-10-CM | POA: Diagnosis not present

## 2021-03-30 DIAGNOSIS — I10 Essential (primary) hypertension: Secondary | ICD-10-CM | POA: Diagnosis not present

## 2021-03-31 DIAGNOSIS — R109 Unspecified abdominal pain: Secondary | ICD-10-CM | POA: Diagnosis not present

## 2021-03-31 DIAGNOSIS — L97521 Non-pressure chronic ulcer of other part of left foot limited to breakdown of skin: Secondary | ICD-10-CM | POA: Diagnosis not present

## 2021-03-31 DIAGNOSIS — Z992 Dependence on renal dialysis: Secondary | ICD-10-CM | POA: Diagnosis not present

## 2021-03-31 DIAGNOSIS — R52 Pain, unspecified: Secondary | ICD-10-CM | POA: Diagnosis not present

## 2021-03-31 DIAGNOSIS — L97529 Non-pressure chronic ulcer of other part of left foot with unspecified severity: Secondary | ICD-10-CM | POA: Diagnosis not present

## 2021-03-31 DIAGNOSIS — E1122 Type 2 diabetes mellitus with diabetic chronic kidney disease: Secondary | ICD-10-CM | POA: Diagnosis not present

## 2021-03-31 DIAGNOSIS — E1143 Type 2 diabetes mellitus with diabetic autonomic (poly)neuropathy: Secondary | ICD-10-CM | POA: Diagnosis not present

## 2021-03-31 DIAGNOSIS — R1084 Generalized abdominal pain: Secondary | ICD-10-CM | POA: Diagnosis not present

## 2021-03-31 DIAGNOSIS — F32A Depression, unspecified: Secondary | ICD-10-CM | POA: Diagnosis not present

## 2021-03-31 DIAGNOSIS — N186 End stage renal disease: Secondary | ICD-10-CM | POA: Diagnosis not present

## 2021-03-31 DIAGNOSIS — I120 Hypertensive chronic kidney disease with stage 5 chronic kidney disease or end stage renal disease: Secondary | ICD-10-CM | POA: Diagnosis not present

## 2021-03-31 DIAGNOSIS — R456 Violent behavior: Secondary | ICD-10-CM | POA: Diagnosis not present

## 2021-03-31 DIAGNOSIS — E11621 Type 2 diabetes mellitus with foot ulcer: Secondary | ICD-10-CM | POA: Diagnosis not present

## 2021-03-31 DIAGNOSIS — S99922A Unspecified injury of left foot, initial encounter: Secondary | ICD-10-CM | POA: Diagnosis not present

## 2021-03-31 DIAGNOSIS — R1013 Epigastric pain: Secondary | ICD-10-CM | POA: Diagnosis not present

## 2021-03-31 DIAGNOSIS — Z794 Long term (current) use of insulin: Secondary | ICD-10-CM | POA: Diagnosis not present

## 2021-03-31 DIAGNOSIS — R0600 Dyspnea, unspecified: Secondary | ICD-10-CM | POA: Diagnosis not present

## 2021-04-01 DIAGNOSIS — Z992 Dependence on renal dialysis: Secondary | ICD-10-CM | POA: Diagnosis not present

## 2021-04-01 DIAGNOSIS — N186 End stage renal disease: Secondary | ICD-10-CM | POA: Diagnosis not present

## 2021-04-02 DIAGNOSIS — E785 Hyperlipidemia, unspecified: Secondary | ICD-10-CM | POA: Diagnosis not present

## 2021-04-02 DIAGNOSIS — R110 Nausea: Secondary | ICD-10-CM | POA: Diagnosis not present

## 2021-04-02 DIAGNOSIS — K3184 Gastroparesis: Secondary | ICD-10-CM | POA: Diagnosis not present

## 2021-04-02 DIAGNOSIS — Z794 Long term (current) use of insulin: Secondary | ICD-10-CM | POA: Diagnosis not present

## 2021-04-02 DIAGNOSIS — Z7982 Long term (current) use of aspirin: Secondary | ICD-10-CM | POA: Diagnosis not present

## 2021-04-02 DIAGNOSIS — R079 Chest pain, unspecified: Secondary | ICD-10-CM | POA: Diagnosis not present

## 2021-04-02 DIAGNOSIS — R1084 Generalized abdominal pain: Secondary | ICD-10-CM | POA: Diagnosis not present

## 2021-04-02 DIAGNOSIS — R52 Pain, unspecified: Secondary | ICD-10-CM | POA: Diagnosis not present

## 2021-04-02 DIAGNOSIS — Z880 Allergy status to penicillin: Secondary | ICD-10-CM | POA: Diagnosis not present

## 2021-04-02 DIAGNOSIS — R1111 Vomiting without nausea: Secondary | ICD-10-CM | POA: Diagnosis not present

## 2021-04-02 DIAGNOSIS — I10 Essential (primary) hypertension: Secondary | ICD-10-CM | POA: Diagnosis not present

## 2021-04-02 DIAGNOSIS — R109 Unspecified abdominal pain: Secondary | ICD-10-CM | POA: Diagnosis not present

## 2021-04-02 DIAGNOSIS — E1165 Type 2 diabetes mellitus with hyperglycemia: Secondary | ICD-10-CM | POA: Diagnosis not present

## 2021-04-02 DIAGNOSIS — F209 Schizophrenia, unspecified: Secondary | ICD-10-CM | POA: Diagnosis not present

## 2021-04-02 DIAGNOSIS — E1143 Type 2 diabetes mellitus with diabetic autonomic (poly)neuropathy: Secondary | ICD-10-CM | POA: Diagnosis not present

## 2021-04-03 ENCOUNTER — Encounter (HOSPITAL_COMMUNITY): Payer: Self-pay | Admitting: Emergency Medicine

## 2021-04-03 ENCOUNTER — Emergency Department (HOSPITAL_COMMUNITY): Payer: Medicare HMO

## 2021-04-03 ENCOUNTER — Emergency Department (HOSPITAL_COMMUNITY)
Admission: EM | Admit: 2021-04-03 | Discharge: 2021-04-03 | Disposition: A | Discharge status: Home / Self Care | Payer: Medicare HMO | Attending: Emergency Medicine | Admitting: Emergency Medicine

## 2021-04-03 ENCOUNTER — Other Ambulatory Visit: Payer: Self-pay

## 2021-04-03 DIAGNOSIS — R1084 Generalized abdominal pain: Secondary | ICD-10-CM | POA: Insufficient documentation

## 2021-04-03 DIAGNOSIS — R404 Transient alteration of awareness: Secondary | ICD-10-CM | POA: Diagnosis not present

## 2021-04-03 DIAGNOSIS — R1012 Left upper quadrant pain: Secondary | ICD-10-CM | POA: Diagnosis not present

## 2021-04-03 DIAGNOSIS — R1010 Upper abdominal pain, unspecified: Secondary | ICD-10-CM

## 2021-04-03 DIAGNOSIS — E1110 Type 2 diabetes mellitus with ketoacidosis without coma: Secondary | ICD-10-CM | POA: Diagnosis not present

## 2021-04-03 DIAGNOSIS — I132 Hypertensive heart and chronic kidney disease with heart failure and with stage 5 chronic kidney disease, or end stage renal disease: Secondary | ICD-10-CM | POA: Insufficient documentation

## 2021-04-03 DIAGNOSIS — R1013 Epigastric pain: Secondary | ICD-10-CM | POA: Diagnosis not present

## 2021-04-03 DIAGNOSIS — N271 Small kidney, bilateral: Secondary | ICD-10-CM | POA: Diagnosis not present

## 2021-04-03 DIAGNOSIS — R52 Pain, unspecified: Secondary | ICD-10-CM | POA: Diagnosis not present

## 2021-04-03 DIAGNOSIS — R0902 Hypoxemia: Secondary | ICD-10-CM | POA: Diagnosis not present

## 2021-04-03 DIAGNOSIS — E1140 Type 2 diabetes mellitus with diabetic neuropathy, unspecified: Secondary | ICD-10-CM | POA: Insufficient documentation

## 2021-04-03 DIAGNOSIS — E1122 Type 2 diabetes mellitus with diabetic chronic kidney disease: Secondary | ICD-10-CM | POA: Insufficient documentation

## 2021-04-03 DIAGNOSIS — Z79899 Other long term (current) drug therapy: Secondary | ICD-10-CM | POA: Diagnosis not present

## 2021-04-03 DIAGNOSIS — I5030 Unspecified diastolic (congestive) heart failure: Secondary | ICD-10-CM | POA: Diagnosis not present

## 2021-04-03 DIAGNOSIS — Z992 Dependence on renal dialysis: Secondary | ICD-10-CM | POA: Diagnosis not present

## 2021-04-03 DIAGNOSIS — R109 Unspecified abdominal pain: Secondary | ICD-10-CM | POA: Diagnosis not present

## 2021-04-03 DIAGNOSIS — N186 End stage renal disease: Secondary | ICD-10-CM | POA: Insufficient documentation

## 2021-04-03 DIAGNOSIS — R4182 Altered mental status, unspecified: Secondary | ICD-10-CM | POA: Diagnosis not present

## 2021-04-03 DIAGNOSIS — Z8616 Personal history of COVID-19: Secondary | ICD-10-CM | POA: Insufficient documentation

## 2021-04-03 LAB — COMPREHENSIVE METABOLIC PANEL
ALT: 27 U/L (ref 0–44)
AST: 30 U/L (ref 15–41)
Albumin: 3.4 g/dL — ABNORMAL LOW (ref 3.5–5.0)
Alkaline Phosphatase: 61 U/L (ref 38–126)
Anion gap: 9 (ref 5–15)
BUN: 55 mg/dL — ABNORMAL HIGH (ref 6–20)
CO2: 25 mmol/L (ref 22–32)
Calcium: 8.8 mg/dL — ABNORMAL LOW (ref 8.9–10.3)
Chloride: 99 mmol/L (ref 98–111)
Creatinine, Ser: 6.39 mg/dL — ABNORMAL HIGH (ref 0.61–1.24)
GFR, Estimated: 10 mL/min — ABNORMAL LOW (ref >60)
Glucose, Bld: 156 mg/dL — ABNORMAL HIGH (ref 70–99)
Potassium: 3.3 mmol/L — ABNORMAL LOW (ref 3.5–5.1)
Sodium: 133 mmol/L — ABNORMAL LOW (ref 135–145)
Total Bilirubin: 0.7 mg/dL (ref 0.3–1.2)
Total Protein: 7.2 g/dL (ref 6.5–8.1)

## 2021-04-03 LAB — CBC WITH DIFFERENTIAL/PLATELET
Abs Immature Granulocytes: 0.03 10*3/uL (ref 0.00–0.07)
Basophils Absolute: 0 10*3/uL (ref 0.0–0.1)
Basophils Relative: 0 %
Eosinophils Absolute: 0 10*3/uL (ref 0.0–0.5)
Eosinophils Relative: 0 %
HCT: 32.6 % — ABNORMAL LOW (ref 39.0–52.0)
Hemoglobin: 11.2 g/dL — ABNORMAL LOW (ref 13.0–17.0)
Immature Granulocytes: 1 %
Lymphocytes Relative: 16 %
Lymphs Abs: 1 10*3/uL (ref 0.7–4.0)
MCH: 35.8 pg — ABNORMAL HIGH (ref 26.0–34.0)
MCHC: 34.4 g/dL (ref 30.0–36.0)
MCV: 104.2 fL — ABNORMAL HIGH (ref 80.0–100.0)
Monocytes Absolute: 0.6 10*3/uL (ref 0.1–1.0)
Monocytes Relative: 10 %
Neutro Abs: 4.6 10*3/uL (ref 1.7–7.7)
Neutrophils Relative %: 73 %
Platelets: 109 10*3/uL — ABNORMAL LOW (ref 150–400)
RBC: 3.13 MIL/uL — ABNORMAL LOW (ref 4.22–5.81)
RDW: 13.1 % (ref 11.5–15.5)
WBC: 6.3 10*3/uL (ref 4.0–10.5)
nRBC: 0 % (ref 0.0–0.2)

## 2021-04-03 LAB — LIPASE, BLOOD: Lipase: 28 U/L (ref 11–51)

## 2021-04-03 MED ORDER — HYDROMORPHONE HCL 1 MG/ML IJ SOLN: 0.5000 mg | Freq: Once | INTRAMUSCULAR | Status: DC

## 2021-04-03 MED ORDER — OXYCODONE-ACETAMINOPHEN 5-325 MG PO TABS: 1.0000 | ORAL_TABLET | Freq: Four times a day (QID) | ORAL | 0 refills | Status: DC | PRN

## 2021-04-03 MED ORDER — PROCHLORPERAZINE EDISYLATE 10 MG/2ML IJ SOLN: 10.0000 mg | Freq: Once | INTRAMUSCULAR | Status: DC

## 2021-04-03 MED ORDER — HYDROMORPHONE HCL 1 MG/ML IJ SOLN
0.5000 mg | Freq: Once | INTRAMUSCULAR | Status: AC
  Administered 2021-04-03: 0.5 mg via INTRAMUSCULAR
  Filled 2021-04-03: qty 1

## 2021-04-03 MED ORDER — PANTOPRAZOLE SODIUM 40 MG IV SOLR: 40.0000 mg | Freq: Once | INTRAVENOUS | Status: DC

## 2021-04-03 NOTE — ED Notes (Signed)
MD aware of pts hard IV access. Unable to  Gain IV access at this time.

## 2021-04-03 NOTE — Discharge Instructions (Addendum)
Follow-up with your stomach doctor next week.  Go to dialysis tomorrow.  Return if any problem

## 2021-04-03 NOTE — ED Triage Notes (Signed)
Pt to the ED via [facility] from dialysis in [place].  Pt had not started his treatment when he became belligerent and [place] police had to be called.  Pt was uncooperative with EMS personal and stood up in the back of the truck in route. Patient is partially blind and was upset due to the death of a friend. Pt is calm at this time.

## 2021-04-03 NOTE — ED Notes (Signed)
Pt sleeping at this time. Pt in no obvious distress. Equal rise and fall of chest. Pt stretcher locked and lowered. Will continue to monitor pt.

## 2021-04-04 DIAGNOSIS — Z881 Allergy status to other antibiotic agents status: Secondary | ICD-10-CM | POA: Diagnosis not present

## 2021-04-04 DIAGNOSIS — N186 End stage renal disease: Secondary | ICD-10-CM | POA: Diagnosis not present

## 2021-04-04 DIAGNOSIS — R1084 Generalized abdominal pain: Secondary | ICD-10-CM | POA: Diagnosis not present

## 2021-04-04 DIAGNOSIS — Z886 Allergy status to analgesic agent status: Secondary | ICD-10-CM | POA: Diagnosis not present

## 2021-04-04 DIAGNOSIS — E1122 Type 2 diabetes mellitus with diabetic chronic kidney disease: Secondary | ICD-10-CM | POA: Diagnosis not present

## 2021-04-04 DIAGNOSIS — R109 Unspecified abdominal pain: Secondary | ICD-10-CM | POA: Diagnosis not present

## 2021-04-04 DIAGNOSIS — Z91013 Allergy to seafood: Secondary | ICD-10-CM | POA: Diagnosis not present

## 2021-04-04 DIAGNOSIS — I120 Hypertensive chronic kidney disease with stage 5 chronic kidney disease or end stage renal disease: Secondary | ICD-10-CM | POA: Diagnosis not present

## 2021-04-04 DIAGNOSIS — Z992 Dependence on renal dialysis: Secondary | ICD-10-CM | POA: Diagnosis not present

## 2021-04-04 DIAGNOSIS — R079 Chest pain, unspecified: Secondary | ICD-10-CM | POA: Diagnosis not present

## 2021-04-04 DIAGNOSIS — K2900 Acute gastritis without bleeding: Secondary | ICD-10-CM | POA: Diagnosis not present

## 2021-04-04 DIAGNOSIS — R1013 Epigastric pain: Secondary | ICD-10-CM | POA: Diagnosis not present

## 2021-04-05 ENCOUNTER — Emergency Department (HOSPITAL_COMMUNITY)
Admission: EM | Admit: 2021-04-05 | Discharge: 2021-04-08 | Disposition: A | Discharge status: Home / Self Care | Payer: Medicare HMO | Attending: Emergency Medicine | Admitting: Emergency Medicine

## 2021-04-05 ENCOUNTER — Encounter (HOSPITAL_COMMUNITY): Payer: Self-pay | Admitting: *Deleted

## 2021-04-05 ENCOUNTER — Other Ambulatory Visit: Payer: Self-pay

## 2021-04-05 DIAGNOSIS — Z794 Long term (current) use of insulin: Secondary | ICD-10-CM | POA: Insufficient documentation

## 2021-04-05 DIAGNOSIS — R45851 Suicidal ideations: Secondary | ICD-10-CM | POA: Insufficient documentation

## 2021-04-05 DIAGNOSIS — F419 Anxiety disorder, unspecified: Secondary | ICD-10-CM

## 2021-04-05 DIAGNOSIS — Y900 Blood alcohol level of less than 20 mg/100 ml: Secondary | ICD-10-CM | POA: Insufficient documentation

## 2021-04-05 DIAGNOSIS — R079 Chest pain, unspecified: Secondary | ICD-10-CM | POA: Diagnosis not present

## 2021-04-05 DIAGNOSIS — N186 End stage renal disease: Secondary | ICD-10-CM

## 2021-04-05 DIAGNOSIS — Z79899 Other long term (current) drug therapy: Secondary | ICD-10-CM | POA: Insufficient documentation

## 2021-04-05 DIAGNOSIS — Z8616 Personal history of COVID-19: Secondary | ICD-10-CM | POA: Diagnosis not present

## 2021-04-05 DIAGNOSIS — I132 Hypertensive heart and chronic kidney disease with heart failure and with stage 5 chronic kidney disease, or end stage renal disease: Secondary | ICD-10-CM | POA: Diagnosis not present

## 2021-04-05 DIAGNOSIS — R1084 Generalized abdominal pain: Secondary | ICD-10-CM

## 2021-04-05 DIAGNOSIS — Z20822 Contact with and (suspected) exposure to covid-19: Secondary | ICD-10-CM | POA: Insufficient documentation

## 2021-04-05 DIAGNOSIS — D649 Anemia, unspecified: Secondary | ICD-10-CM | POA: Diagnosis not present

## 2021-04-05 DIAGNOSIS — E1122 Type 2 diabetes mellitus with diabetic chronic kidney disease: Secondary | ICD-10-CM | POA: Diagnosis not present

## 2021-04-05 DIAGNOSIS — R1013 Epigastric pain: Secondary | ICD-10-CM | POA: Diagnosis not present

## 2021-04-05 DIAGNOSIS — F314 Bipolar disorder, current episode depressed, severe, without psychotic features: Secondary | ICD-10-CM | POA: Insufficient documentation

## 2021-04-05 DIAGNOSIS — F4325 Adjustment disorder with mixed disturbance of emotions and conduct: Secondary | ICD-10-CM

## 2021-04-05 DIAGNOSIS — Z992 Dependence on renal dialysis: Secondary | ICD-10-CM | POA: Insufficient documentation

## 2021-04-05 DIAGNOSIS — N250 Renal osteodystrophy: Secondary | ICD-10-CM | POA: Diagnosis not present

## 2021-04-05 DIAGNOSIS — R109 Unspecified abdominal pain: Secondary | ICD-10-CM | POA: Diagnosis not present

## 2021-04-05 DIAGNOSIS — I509 Heart failure, unspecified: Secondary | ICD-10-CM | POA: Insufficient documentation

## 2021-04-05 DIAGNOSIS — I120 Hypertensive chronic kidney disease with stage 5 chronic kidney disease or end stage renal disease: Secondary | ICD-10-CM | POA: Diagnosis not present

## 2021-04-05 DIAGNOSIS — E876 Hypokalemia: Secondary | ICD-10-CM | POA: Diagnosis not present

## 2021-04-05 LAB — CBC WITH DIFFERENTIAL/PLATELET
Abs Immature Granulocytes: 0.02 10*3/uL (ref 0.00–0.07)
Basophils Absolute: 0 10*3/uL (ref 0.0–0.1)
Basophils Relative: 0 %
Eosinophils Absolute: 0 10*3/uL (ref 0.0–0.5)
Eosinophils Relative: 0 %
HCT: 28.3 % — ABNORMAL LOW (ref 39.0–52.0)
Hemoglobin: 10 g/dL — ABNORMAL LOW (ref 13.0–17.0)
Immature Granulocytes: 0 %
Lymphocytes Relative: 22 %
Lymphs Abs: 1.6 10*3/uL (ref 0.7–4.0)
MCH: 36.5 pg — ABNORMAL HIGH (ref 26.0–34.0)
MCHC: 35.3 g/dL (ref 30.0–36.0)
MCV: 103.3 fL — ABNORMAL HIGH (ref 80.0–100.0)
Monocytes Absolute: 0.8 10*3/uL (ref 0.1–1.0)
Monocytes Relative: 11 %
Neutro Abs: 4.8 10*3/uL (ref 1.7–7.7)
Neutrophils Relative %: 67 %
Platelets: 113 10*3/uL — ABNORMAL LOW (ref 150–400)
RBC: 2.74 MIL/uL — ABNORMAL LOW (ref 4.22–5.81)
RDW: 12.7 % (ref 11.5–15.5)
WBC: 7.3 10*3/uL (ref 4.0–10.5)
nRBC: 0 % (ref 0.0–0.2)

## 2021-04-05 LAB — COMPREHENSIVE METABOLIC PANEL
ALT: 26 U/L (ref 0–44)
AST: 36 U/L (ref 15–41)
Albumin: 3.5 g/dL (ref 3.5–5.0)
Alkaline Phosphatase: 58 U/L (ref 38–126)
Anion gap: 10 (ref 5–15)
BUN: 67 mg/dL — ABNORMAL HIGH (ref 6–20)
CO2: 21 mmol/L — ABNORMAL LOW (ref 22–32)
Calcium: 8 mg/dL — ABNORMAL LOW (ref 8.9–10.3)
Chloride: 96 mmol/L — ABNORMAL LOW (ref 98–111)
Creatinine, Ser: 9.45 mg/dL — ABNORMAL HIGH (ref 0.61–1.24)
GFR, Estimated: 6 mL/min — ABNORMAL LOW (ref >60)
Glucose, Bld: 105 mg/dL — ABNORMAL HIGH (ref 70–99)
Potassium: 3 mmol/L — ABNORMAL LOW (ref 3.5–5.1)
Sodium: 127 mmol/L — ABNORMAL LOW (ref 135–145)
Total Bilirubin: 0.6 mg/dL (ref 0.3–1.2)
Total Protein: 7 g/dL (ref 6.5–8.1)

## 2021-04-05 LAB — ETHANOL: Alcohol, Ethyl (B): <10 mg/dL (ref <10)

## 2021-04-05 MED ORDER — ALUM & MAG HYDROXIDE-SIMETH 200-200-20 MG/5ML PO SUSP
30.0000 mL | Freq: Once | ORAL | Status: AC
  Administered 2021-04-05: 30 mL via ORAL
  Filled 2021-04-05: qty 30

## 2021-04-05 MED ORDER — ONDANSETRON 4 MG PO TBDP
4.0000 mg | ORAL_TABLET | Freq: Once | ORAL | Status: AC
  Administered 2021-04-05: 4 mg via ORAL
  Filled 2021-04-05: qty 1

## 2021-04-05 NOTE — ED Provider Notes (Signed)
[facility] EMERGENCY DEPARTMENT Provider Note   CSN: [id number] Arrival date & time: 03/11/2021  5102     History Chief Complaint  Patient presents with   Abdominal Pain    Timothy Clark is a 50 y.o. male.  Patient complains of upper epigastric discomfort.  Patient history of chronic abdominal discomfort  The history is provided by the patient and medical records. No language interpreter was used.  Abdominal Pain Pain location:  Generalized Pain quality: aching   Pain radiates to:  Does not radiate Pain severity:  Moderate Timing:  Constant Progression:  Worsening Chronicity:  Recurrent Context: not alcohol use   Relieved by:  Nothing Associated symptoms: no chest pain, no cough, no diarrhea, no fatigue and no hematuria       Past Medical History:  Diagnosis Date   Anemia    Anxiety    CHF (congestive heart failure) (HCC)    diastolic dysfunction   Chronic abdominal pain    COVID-19 07/2019   Depression    Esophagitis    ESRD on hemodialysis ([place])    undergoing evaluation for transplant   Eye hemorrhage    Gastritis    Gastroparesis    GERD (gastroesophageal reflux disease)    Glaucoma    Helicobacter pylori (H. pylori) infection 2016   ERADICATION DOCUMENTED VIA STOOL TEST in AUG 2016 at [facility]   Hiccups    Hypertension    Nausea and vomiting    chronic, recurrent   Neuropathy    feet   Renal insufficiency    Type 2 diabetes mellitus with complications ([place])    diagnosed around age 69   Vitreous hemorrhage of left eye ([place]) 12/15/2019    Patient Active Problem List   Diagnosis Date Noted   Seizures ([place]) 01/02/2021   Seizure ([place]) 01/02/2021   Problem with vascular access 09/17/2020   Bipolar disorder ([place]) 09/17/2020   Acute encephalopathy 07/24/2020   Intractable abdominal pain 07/23/2020   Encephalopathy    Colon cancer screening 07/12/2020   Optic atrophy, left eye 07/03/2020   Stable treated proliferative diabetic retinopathy of right  eye determined by examination associated with type 2 diabetes mellitus ([place]) 07/03/2020   Anxiety 03/13/2020   History of COVID-19 03/13/2020   History of gastroesophageal reflux (GERD) 58/52/7782   History of Helicobacter pylori infection 03/13/2020   Pre-transplant evaluation for kidney transplant 03/13/2020   Renovascular hypertension 03/13/2020   Hematemesis 03/02/2020   Coffee ground emesis 42/35/3614   Complication of vascular access for dialysis 01/29/2020   Follow-up examination after eye surgery 12/15/2019   Type 2 diabetes with nephropathy ([place]) 12/15/2019   Controlled diabetes mellitus with stable proliferative retinopathy of left eye, with long-term current use of insulin ([place]) 12/15/2019   Secondary glaucoma due to combination mechanisms, left, severe stage 12/15/2019   Nausea with vomiting 12/08/2019   Hyperglycemia due to type 2 diabetes mellitus ([place]) 10/28/2019   DKA (diabetic ketoacidoses) 10/27/2019   ESRD on dialysis ([place]) 06/26/2019   Constipation 03/15/2019   Chronic renal insufficiency, stage V ([place]) 02/21/2019   Type 2 diabetes mellitus with proliferative retinopathy ([place]) 10/25/2018   Acute renal failure superimposed on stage 4 chronic kidney disease ([place]) 10/25/2018   Extravasation injury of IV catheter site with other complication, initial encounter ([place])    Intractable vomiting    Chronic kidney disease    Poor venous access    Gastroparesis 10/24/2018   Closed displaced fracture of lateral end of right clavicle 06/18/2018  HTN (hypertension), malignant 06/13/2018   Scrotal swelling 11/10/2017   Swelling 11/09/2017   Scrotal edema    Obesity, Class III, BMI 40-49.9 (morbid obesity) ([place]) 11/02/2017   CKD stage 3 due to type 2 diabetes mellitus ([place]) 10/30/2017   Wound eschars of foot, right  10/30/2017   Chronic abdominal pain 09/21/2017   Hypertensive urgency 09/21/2017   Prolonged QT interval 09/21/2017   GERD (gastroesophageal reflux disease)  07/14/2017   Glaucoma 07/14/2017   Anemia 07/14/2017   Pressure injury of skin 02/01/2017   Insulin dependent diabetes mellitus 09/07/2016   Orthostatic syncope 09/05/2016   Dumping syndrome 04/22/2016   Gastroparesis due to DM ([place]) 02/06/2015   Essential hypertension 12/06/2014   Diabetic macular edema ([place]) 02/18/2014   PDR (proliferative diabetic retinopathy) ([place]) 02/06/2014   Neovascular glaucoma of left eye 01/11/2014   Diabetic infection of left heel ([place]) 02/14/2010   UNSPECIFIED PERIPHERAL VASCULAR DISEASE 10/19/2009   ERECTILE DYSFUNCTION, ORGANIC 10/19/2009   NUMBNESS 07/20/2009   Diabetes mellitus type 2 with complications, uncontrolled ([place]) 03/22/2009   Hyperlipidemia 03/22/2009   Depression 03/22/2009    Past Surgical History:  Procedure Laterality Date   AMPUTATION TOE Right    great toe   AV FISTULA INSERTION W/ RF MAGNETIC GUIDANCE Left 04/06/2019   Procedure: AV FISTULA INSERTION W/RF MAGNETIC GUIDANCE;  Surgeon: Katha Cabal, MD;  Location: [place] [facility];  Service: Cardiovascular;  Laterality: Left;   AV FISTULA PLACEMENT Left 05/06/2019   Procedure: INSERTION OF ARTERIOVENOUS (AV) GORE-TEX GRAFT ARM ( FOREARM LOOP );  Surgeon: Katha Cabal, MD;  Location: [facility];  Service: Vascular;  Laterality: Left;   AV FISTULA PLACEMENT Left 03/21/2020   Procedure: INSERTION OF ARTERIOVENOUS (AV) GORE-TEX GRAFT ARM ( BRACHIAL AXILLARY );  Surgeon: Katha Cabal, MD;  Location: [facility];  Service: Vascular;  Laterality: Left;   CATARACT EXTRACTION W/PHACO Left 05/19/2016   Procedure: CATARACT EXTRACTION PHACO AND INTRAOCULAR LENS PLACEMENT LEFT EYE;  Surgeon: Tonny Branch, MD;  Location: [facility];  Service: Ophthalmology;  Laterality: Left;  CDE: 7.30   CATARACT EXTRACTION W/PHACO Right 06/23/2016   Procedure: CATARACT EXTRACTION PHACO AND INTRAOCULAR LENS PLACEMENT RIGHT EYE CDE=9.87;  Surgeon: Tonny Branch, MD;  Location: [facility];  Service: Ophthalmology;   Laterality: Right;  right   DIALYSIS/PERMA CATHETER REMOVAL N/A 07/19/2019   Procedure: DIALYSIS/PERMA CATHETER REMOVAL;  Surgeon: Katha Cabal, MD;  Location: [place] [facility];  Service: Cardiovascular;  Laterality: N/A;   ESOPHAGOGASTRODUODENOSCOPY  2015   Dr. Britta Mccreedy   ESOPHAGOGASTRODUODENOSCOPY N/A 10/26/2014   RMR: Distal esophagititis-likely reflux related although an element of pill induced injuruy no exclueded  status post biopsy. Diffusely abnormal gastric mucosa of uncertain signigicane -status post gastric biopsy. Focal area of excoriation in the cardia most consistant with a trauma of heaving.    ESOPHAGOGASTRODUODENOSCOPY  08/2015   [facility]: mild esophagitis and old gastric contents   EYE SURGERY Left 2015   stent placed to left eye   EYE SURGERY Right 06/2019   per patient- to remove scar tissue   INCISION AND DRAINAGE OF WOUND Right 07/16/2017   Procedure: IRRIGATION AND DEBRIDEMENT OF SOFT TISSUE OF ULCERATION RIGHT FOOT;  Surgeon: Caprice Beaver, DPM;  Location: [facility];  Service: Podiatry;  Laterality: Right;   IR THROMBECTOMY AV FISTULA W/THROMBOLYSIS/PTA INC/SHUNT/IMG LEFT Left 09/19/2020   IR US GUIDE VASC ACCESS LEFT  09/19/2020   PERIPHERAL VASCULAR THROMBECTOMY Left 09/27/2019   Procedure: PERIPHERAL VASCULAR THROMBECTOMY;  Surgeon: Katha Cabal, MD;  Location: [place] [facility];  Service: Cardiovascular;  Laterality: Left;   PERIPHERAL VASCULAR THROMBECTOMY Left 10/27/2019   Procedure: PERIPHERAL VASCULAR THROMBECTOMY;  Surgeon: Algernon Huxley, MD;  Location: [place] [facility];  Service: Cardiovascular;  Laterality: Left;   PERIPHERAL VASCULAR THROMBECTOMY Left 12/13/2019   Procedure: PERIPHERAL VASCULAR THROMBECTOMY;  Surgeon: Katha Cabal, MD;  Location: [place] [facility];  Service: Cardiovascular;  Laterality: Left;       Family History  Problem Relation Age of Onset   Ovarian cancer Mother    Heart attack Father    Colon polyps Father     Cervical cancer Sister    Diabetes Sister    Colon cancer Neg Hx     Social History   Tobacco Use   Smoking status: Never   Smokeless tobacco: Never  Vaping Use   Vaping Use: Never used  Substance Use Topics   Alcohol use: No    Alcohol/week: 0.0 standard drinks   Drug use: No    Home Medications Prior to Admission medications   Medication Sig Start Date End Date Taking? Authorizing Provider  oxyCODONE-acetaminophen (PERCOCET) 5-325 MG tablet Take 1 tablet by mouth every 6 (six) hours as needed. 03/12/2021  Yes Milton Ferguson, MD  ALPRAZolam Duanne Moron) 0.5 MG tablet Take 0.5 mg by mouth 2 (two) times daily as needed for anxiety.    [provider]  amLODipine (NORVASC) 5 MG tablet Take 5 mg by mouth daily.    [provider]  atorvastatin (LIPITOR) 10 MG tablet Take 1 tablet (10 mg total) by mouth at bedtime. 10/13/20 02/17/21  Johnson, Clanford L, MD  brimonidine (ALPHAGAN) 0.15 % ophthalmic solution Place 1 drop into both eyes in the morning and at bedtime.     [provider]  dicyclomine (BENTYL) 10 MG capsule Take 10 mg by mouth 4 (four) times daily -  before meals and at bedtime.    [provider]  divalproex (DEPAKOTE) 250 MG DR tablet Take 750 mg by mouth 3 (three) times daily.    [provider]  divalproex (DEPAKOTE) 500 MG DR tablet  08/30/20   [provider]  dorzolamide-timolol (COSOPT) 22.3-6.8 MG/ML ophthalmic solution Place 1 drop into both eyes 2 (two) times daily.  01/16/20   [provider]  famotidine (PEPCID) 20 MG tablet Take 20 mg by mouth 2 (two) times daily.    [provider]  folic acid (FOLVITE) 1 MG tablet Take 1 mg by mouth daily.    [provider]  gentamicin cream (GARAMYCIN) 0.1 % Apply 1 application topically 2 (two) times daily. Patient not taking: No sig reported 12/12/20   Edrick Kins, DPM  Glucagon (GVOKE HYPOPEN 2-PACK) 1 MG/0.2ML SOAJ Inject 1 Syringe into the skin as  needed (hypoglycemia).    [provider]  Iron-FA-B Cmp-C-Biot-Probiotic (FUSION PLUS PO) Take 1 capsule by mouth daily.    [provider]  LEADER UNIFINE PENTIPS PLUS 32G X 4 MM MISC  05/17/20   [provider]  linezolid (ZYVOX) 600 MG tablet linezolid 600 mg tablet  TAKE 1 TABLET BY MOUTH EVERY 12 HOURS Patient not taking: No sig reported    [provider]  metoCLOPramide (REGLAN) 10 MG tablet Take 10 mg by mouth every 6 (six) hours as needed for nausea or vomiting.    [provider]  NOVOLOG MIX 70/30 FLEXPEN (70-30) 100 UNIT/ML FlexPen Inject 35-45 Units into the  skin See admin instructions. Inject 45 units in the morning and 35 every evening 02/06/21   [provider]  ondansetron (ZOFRAN-ODT) 4 MG disintegrating tablet Take 4 mg by mouth every 8 (eight) hours as needed for nausea or vomiting.    [provider]  pantoprazole (PROTONIX) 40 MG tablet Take 1 tablet (40 mg total) by mouth 2 (two) times daily before a meal. 08/24/18   Annitta Needs, NP  polyethylene glycol (MIRALAX / GLYCOLAX) 17 g packet Take 17 g by mouth daily as needed for mild constipation. Patient not taking: No sig reported 10/13/20   Wynetta Emery, Clanford L, MD  senna-docusate (SENOKOT-S) 8.6-50 MG tablet Take 1 tablet by mouth 2 (two) times daily. 10/13/20   Johnson, Clanford L, MD  sitaGLIPtin (JANUVIA) 25 MG tablet Take 25 mg by mouth daily.     [provider]  sucralfate (CARAFATE) 1 g tablet Take 1 g by mouth 4 (four) times daily.    [provider]  tiZANidine (ZANAFLEX) 2 MG tablet Take 2 mg by mouth See admin instructions. Take 1 tablet by mouth every 6 to 8 hours for pain/muscle spams Patient not taking: No sig reported    [provider]  torsemide (DEMADEX) 20 MG tablet Take 20 mg by mouth daily. At noon    [provider]  valproic acid (DEPAKENE) 250 MG capsule Take 750 mg by mouth 3 (three) times daily.    [provider]  ziprasidone (GEODON) 20 MG capsule Take 1 tab with breakfast and 2 tabs at HS Patient taking differently: Take 20-40 mg by mouth See admin instructions. Take 20 mg capsule with breakfast and 40 mg at bedtime 10/13/20   Murlean Iba, MD    Allergies    Donnatal [phenobarbital-belladonna alk], Fish allergy, Haldol [haloperidol lactate], Lidocaine, Maalox [calcium carbonate antacid], Shellfish allergy, Aspirin, Doxycycline, Keflex [cephalexin], Marinol [dronabinol], Other, Penicillins, and Bactrim [sulfamethoxazole-trimethoprim]  Review of Systems   Review of Systems  Constitutional:  Negative for appetite change and fatigue.  HENT:  Negative for congestion, ear discharge and sinus pressure.   Eyes:  Negative for discharge.  Respiratory:  Negative for cough.   Cardiovascular:  Negative for chest pain.  Gastrointestinal:  Positive for abdominal pain. Negative for diarrhea.  Genitourinary:  Negative for frequency and hematuria.  Musculoskeletal:  Negative for back pain.  Skin:  Negative for rash.  Neurological:  Negative for seizures and headaches.  Psychiatric/Behavioral:  Negative for hallucinations.    Physical Exam Updated Vital Signs BP (!) 182/89 (BP Location: Right Arm)   Pulse 89   Temp 99.6 F (37.6 C) (Oral)   Resp 16   Ht 6' (1.829 m)   Wt 127 kg   SpO2 100%   BMI 37.97 kg/m   Physical Exam Vitals and nursing note reviewed.  Constitutional:      Appearance: He is well-developed.  HENT:     Head: Normocephalic.     Nose: Nose normal.  Eyes:     General: No scleral icterus.    Conjunctiva/sclera: Conjunctivae normal.  Neck:     Thyroid: No thyromegaly.  Cardiovascular:     Rate and Rhythm: Normal rate and regular rhythm.     Heart sounds: No murmur heard.   No friction rub. No gallop.  Pulmonary:     Breath sounds: No stridor. No wheezing or rales.  Chest:     Chest wall: No tenderness.  Abdominal:     General: There is  no distension.      Tenderness: There is abdominal tenderness. There is no rebound.  Musculoskeletal:        General: Normal range of motion.     Cervical back: Neck supple.  Lymphadenopathy:     Cervical: No cervical adenopathy.  Skin:    Findings: No erythema or rash.  Neurological:     Mental Status: He is alert and oriented to person, place, and time.     Motor: No abnormal muscle tone.     Coordination: Coordination normal.  Psychiatric:        Behavior: Behavior normal.    ED Results / Procedures / Treatments   Labs (all labs ordered are listed, but only abnormal results are displayed) Labs Reviewed  CBC WITH DIFFERENTIAL/PLATELET - Abnormal; Notable for the following components:      Result Value   RBC 3.13 (*)    Hemoglobin 11.2 (*)    HCT 32.6 (*)    MCV 104.2 (*)    MCH 35.8 (*)    Platelets 109 (*)    All other components within normal limits  COMPREHENSIVE METABOLIC PANEL - Abnormal; Notable for the following components:   Sodium 133 (*)    Potassium 3.3 (*)    Glucose, Bld 156 (*)    BUN 55 (*)    Creatinine, Ser 6.39 (*)    Calcium 8.8 (*)    Albumin 3.4 (*)    GFR, Estimated 10 (*)    All other components within normal limits  LIPASE, BLOOD    EKG None  Radiology CT ABDOMEN PELVIS WO CONTRAST  Result Date: 04/05/2021 CLINICAL DATA:  50 year old male with abdominal pain before starting dialysis today. EXAM: CT ABDOMEN AND PELVIS WITHOUT CONTRAST TECHNIQUE: Multidetector CT imaging of the abdomen and pelvis was performed following the standard protocol without IV contrast. COMPARISON:  CT Abdomen and Pelvis 02/13/2021 and earlier. FINDINGS: Lower chest: Negative.  No pericardial or pleural effusion. Hepatobiliary: Vicarious excretion of contrast to the gallbladder. Negative noncontrast liver. No bile duct enlargement. Pancreas: Negative. Spleen: Negative. Adrenals/Urinary Tract: Negative adrenal glands. Nonobstructed kidneys with a small volume of excreted IV contrast in  the renal collecting systems and ureters. A degree of chronic renal cortical volume loss and symmetric perinephric inflammatory stranding appears stable since July. There is excreted contrast in the urinary bladder which is negative aside from mild chronic bladder wall thickening. Stomach/Bowel: Negative large bowel. Appendix remains within normal limits (series 2, image 64). Decompressed and negative terminal ileum. No dilated small bowel. Negative stomach and duodenum. No free air, free fluid, mesenteric inflammation. Vascular/Lymphatic: Normal caliber abdominal aorta. Iliofemoral calcified atherosclerosis. Vascular patency is not evaluated in the absence of IV contrast. No lymphadenopathy. Reproductive: Negative. Other: No pelvic free fluid. Musculoskeletal: Chronic right 10th through 12th rib fractures. Stable visible thoracolumbar spine. No new osseous abnormality identified. IMPRESSION: 1. No acute or inflammatory process identified. 2. Evidence of recent contrast administration, with vicarious contrast excretion to the gallbladder and a small volume of excreted contrast in the nondilated renal collecting systems, ureters, and bladder. 3. Chronic right posterior rib fractures. Electronically Signed   By: Genevie Ann M.D.   On: 04/04/2021 11:52    Procedures Procedures   Medications Ordered in ED Medications  HYDROmorphone (DILAUDID) injection 0.5 mg (0.5 mg Intramuscular Given 04/02/2021 1105)    ED Course  I have reviewed the triage vital signs and the nursing notes.  Pertinent labs & imaging results that were available during my  care of the patient were reviewed by me and considered in my medical decision making (see chart for details).    MDM Rules/Calculators/A&P                           Patient with exacerbation of his chronic abdominal discomfort.  Patient has history  Gastroparesis.  CT scan negative.  He is given some pain medicine will continue his present medicines and follow-up with  his GI doctor Final Clinical Impression(s) / ED Diagnoses Final diagnoses:  Pain of upper abdomen    Rx / DC Orders ED Discharge Orders          Ordered    oxyCODONE-acetaminophen (PERCOCET) 5-325 MG tablet  Every 6 hours PRN        03/14/2021 1343             Milton Ferguson, MD 04/05/21 [phone]

## 2021-04-05 NOTE — ED Triage Notes (Signed)
States he has pain in abdomen and has not has dialysis for the past 2 treatments

## 2021-04-05 NOTE — ED Triage Notes (Signed)
Family member states he has been aggressive at dialysis, states he needs behavioral health evaluation

## 2021-04-05 NOTE — ED Provider Notes (Signed)
  Provider Note MRN:  [id number]  Arrival date & time: 04/05/21    ED Course and Medical Decision Making  Assumed care from Dr. Karle Starch at shift change.  Here for SI, having chronic abdominal pain lately, recent negative work-up in the emergency department, dialysis patient who is missed 2 sessions recently.  We will recheck labs in the morning and inform morning team of his need for dialysis at some point if he were to stay for an extended period of time for TTS evaluation.  4 AM update: Patient exhibiting some tremulous behavior in the hallway.  He somnolent but wakes and able to talk during this tremulous behavior.  He is mildly diaphoretic.  Not consistent with seizure activity.  Also having an episode of vomiting.  QT interval a bit long, given 1 mg Ativan.  Considering metabolic disarray, hyperkalemia, rigors.  Repeat labs drawn which are reassuring, no leukocytosis, normal lactic acid, all renal numbers are actually improved from prior.  Patient resting comfortably, no further episodes.  Spoke with nursing staff, apparently earlier in the day he was having similar behaviors, favoring behavioral.  Still need to ensure the daytime nephrology team is aware of the patient.  Signed out to oncoming provider at shift change.  Procedures  Final Clinical Impressions(s) / ED Diagnoses     ICD-10-CM   1. Suicidal ideation  R45.851     2. ESRD on hemodialysis (HCC)  N18.6    Z99.2     3. Hypokalemia  E87.6       ED Discharge Orders     None       Discharge Instructions   None     Barth Kirks. Sedonia Small, [place] [email or web address]    Maudie Flakes, MD 04/06/21 0630
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
## 2021-04-06 DIAGNOSIS — D649 Anemia, unspecified: Secondary | ICD-10-CM | POA: Diagnosis not present

## 2021-04-06 DIAGNOSIS — N250 Renal osteodystrophy: Secondary | ICD-10-CM | POA: Diagnosis not present

## 2021-04-06 DIAGNOSIS — Z992 Dependence on renal dialysis: Secondary | ICD-10-CM | POA: Diagnosis not present

## 2021-04-06 DIAGNOSIS — R109 Unspecified abdominal pain: Secondary | ICD-10-CM | POA: Diagnosis not present

## 2021-04-06 DIAGNOSIS — N186 End stage renal disease: Secondary | ICD-10-CM | POA: Diagnosis not present

## 2021-04-06 DIAGNOSIS — I120 Hypertensive chronic kidney disease with stage 5 chronic kidney disease or end stage renal disease: Secondary | ICD-10-CM | POA: Diagnosis not present

## 2021-04-06 DIAGNOSIS — R45851 Suicidal ideations: Secondary | ICD-10-CM | POA: Diagnosis not present

## 2021-04-06 DIAGNOSIS — E876 Hypokalemia: Secondary | ICD-10-CM | POA: Diagnosis not present

## 2021-04-06 LAB — COMPREHENSIVE METABOLIC PANEL
ALT: 26 U/L (ref 0–44)
AST: 32 U/L (ref 15–41)
Albumin: 3.4 g/dL — ABNORMAL LOW (ref 3.5–5.0)
Alkaline Phosphatase: 58 U/L (ref 38–126)
Anion gap: 11 (ref 5–15)
BUN: 73 mg/dL — ABNORMAL HIGH (ref 6–20)
CO2: 24 mmol/L (ref 22–32)
Calcium: 8.5 mg/dL — ABNORMAL LOW (ref 8.9–10.3)
Chloride: 97 mmol/L — ABNORMAL LOW (ref 98–111)
Creatinine, Ser: 10.55 mg/dL — ABNORMAL HIGH (ref 0.61–1.24)
GFR, Estimated: 5 mL/min — ABNORMAL LOW (ref >60)
Glucose, Bld: 91 mg/dL (ref 70–99)
Potassium: 3.7 mmol/L (ref 3.5–5.1)
Sodium: 132 mmol/L — ABNORMAL LOW (ref 135–145)
Total Bilirubin: 0.8 mg/dL (ref 0.3–1.2)
Total Protein: 7.1 g/dL (ref 6.5–8.1)

## 2021-04-06 LAB — CBC
HCT: 31.1 % — ABNORMAL LOW (ref 39.0–52.0)
Hemoglobin: 10.9 g/dL — ABNORMAL LOW (ref 13.0–17.0)
MCH: 36.6 pg — ABNORMAL HIGH (ref 26.0–34.0)
MCHC: 35 g/dL (ref 30.0–36.0)
MCV: 104.4 fL — ABNORMAL HIGH (ref 80.0–100.0)
Platelets: 114 10*3/uL — ABNORMAL LOW (ref 150–400)
RBC: 2.98 MIL/uL — ABNORMAL LOW (ref 4.22–5.81)
RDW: 12.8 % (ref 11.5–15.5)
WBC: 6.8 10*3/uL (ref 4.0–10.5)
nRBC: 0 % (ref 0.0–0.2)

## 2021-04-06 LAB — LACTIC ACID, PLASMA: Lactic Acid, Venous: 1.8 mmol/L (ref 0.5–1.9)

## 2021-04-06 LAB — I-STAT CHEM 8, ED
BUN: 63 mg/dL — ABNORMAL HIGH (ref 6–20)
Calcium, Ion: 1.17 mmol/L (ref 1.15–1.40)
Chloride: 96 mmol/L — ABNORMAL LOW (ref 98–111)
Creatinine, Ser: 10.9 mg/dL — ABNORMAL HIGH (ref 0.61–1.24)
Glucose, Bld: 86 mg/dL (ref 70–99)
HCT: 32 % — ABNORMAL LOW (ref 39.0–52.0)
Hemoglobin: 10.9 g/dL — ABNORMAL LOW (ref 13.0–17.0)
Potassium: 3.4 mmol/L — ABNORMAL LOW (ref 3.5–5.1)
Sodium: 132 mmol/L — ABNORMAL LOW (ref 135–145)
TCO2: 24 mmol/L (ref 22–32)

## 2021-04-06 LAB — CBG MONITORING, ED
Glucose-Capillary: 99 mg/dL (ref 70–99)
Glucose-Capillary: 101 mg/dL — ABNORMAL HIGH (ref 70–99)
Glucose-Capillary: 112 mg/dL — ABNORMAL HIGH (ref 70–99)

## 2021-04-06 LAB — RESP PANEL BY RT-PCR (FLU A&B, COVID) ARPGX2
Influenza A by PCR: NEGATIVE
Influenza B by PCR: NEGATIVE
SARS Coronavirus 2 by RT PCR: NEGATIVE

## 2021-04-06 LAB — HEMOGLOBIN A1C
Hgb A1c MFr Bld: 5 % (ref 4.8–5.6)
Mean Plasma Glucose: 96.8 mg/dL

## 2021-04-06 MED ORDER — DIVALPROEX SODIUM 250 MG PO DR TAB
750.0000 mg | DELAYED_RELEASE_TABLET | Freq: Three times a day (TID) | ORAL | Status: DC
  Administered 2021-04-06 – 2021-04-08 (×7): 750 mg via ORAL
  Filled 2021-04-06 (×6): qty 3

## 2021-04-06 MED ORDER — LORAZEPAM 2 MG/ML IJ SOLN
1.0000 mg | Freq: Once | INTRAMUSCULAR | Status: AC
  Administered 2021-04-06: 1 mg via INTRAVENOUS
  Filled 2021-04-06: qty 1

## 2021-04-06 MED ORDER — AMLODIPINE BESYLATE 5 MG PO TABS
5.0000 mg | ORAL_TABLET | Freq: Every day | ORAL | Status: DC
  Administered 2021-04-07 – 2021-04-08 (×2): 5 mg via ORAL
  Filled 2021-04-06 (×2): qty 1

## 2021-04-06 MED ORDER — LINAGLIPTIN 5 MG PO TABS
5.0000 mg | ORAL_TABLET | Freq: Every day | ORAL | Status: DC
  Administered 2021-04-07 – 2021-04-08 (×2): 5 mg via ORAL
  Filled 2021-04-06 (×2): qty 1

## 2021-04-06 MED ORDER — ALPRAZOLAM 0.5 MG PO TABS
0.5000 mg | ORAL_TABLET | Freq: Two times a day (BID) | ORAL | Status: DC | PRN
  Administered 2021-04-06 – 2021-04-08 (×2): 0.5 mg via ORAL
  Filled 2021-04-06 (×2): qty 1

## 2021-04-06 MED ORDER — INSULIN ASPART 100 UNIT/ML IJ SOLN
0.0000 [IU] | Freq: Three times a day (TID) | INTRAMUSCULAR | Status: DC
  Administered 2021-04-07 – 2021-04-08 (×2): 1 [IU] via SUBCUTANEOUS
  Filled 2021-04-06 (×2): qty 1

## 2021-04-06 MED ORDER — ONDANSETRON 4 MG PO TBDP: 4.0000 mg | ORAL_TABLET | Freq: Three times a day (TID) | ORAL | Status: DC | PRN

## 2021-04-06 MED ORDER — SUCRALFATE 1 G PO TABS
1.0000 g | ORAL_TABLET | Freq: Four times a day (QID) | ORAL | Status: DC
  Administered 2021-04-06 – 2021-04-08 (×6): 1 g via ORAL
  Filled 2021-04-06 (×7): qty 1

## 2021-04-06 MED ORDER — SODIUM CHLORIDE 0.9 % IV SOLN: 100.0000 mL | INTRAVENOUS | Status: DC | PRN

## 2021-04-06 MED ORDER — PENTAFLUOROPROP-TETRAFLUOROETH EX AERO: 1.0000 "application " | INHALATION_SPRAY | CUTANEOUS | Status: DC | PRN

## 2021-04-06 MED ORDER — ZIPRASIDONE HCL 20 MG PO CAPS
20.0000 mg | ORAL_CAPSULE | ORAL | Status: DC
  Administered 2021-04-06: 40 mg via ORAL
  Filled 2021-04-06: qty 2

## 2021-04-06 MED ORDER — DICYCLOMINE HCL 10 MG PO CAPS
10.0000 mg | ORAL_CAPSULE | Freq: Three times a day (TID) | ORAL | Status: DC
  Administered 2021-04-06 – 2021-04-08 (×7): 10 mg via ORAL
  Filled 2021-04-06 (×7): qty 1

## 2021-04-06 MED ORDER — ZIPRASIDONE MESYLATE 20 MG IM SOLR
20.0000 mg | Freq: Once | INTRAMUSCULAR | Status: AC
  Administered 2021-04-06: 20 mg via INTRAMUSCULAR
  Filled 2021-04-06: qty 20

## 2021-04-06 NOTE — ED Notes (Signed)
Patient on stretcher in hallway, patient snoring and sound asleep.

## 2021-04-06 NOTE — ED Notes (Signed)
Pt trying to get out of bed repeatedly stating that he is going to leave.

## 2021-04-06 NOTE — Procedures (Signed)
   HEMODIALYSIS TREATMENT NOTE:  Uneventful 3 hour heparin-free session completed via left upper arm AVG (15g ante/retrograde).  Goal met: 3 liters removed without interruption in UF.  Hemodynamically stable, calm, and cooperative throughout treatment.  All blood was returned and hemostasis was achieved in 15 minutes.  No changes from pre-HD assessment.   Rockwell Alexandria, RN

## 2021-04-06 NOTE — ED Notes (Signed)
Pt left at 1600 to go upstairs to dialysis and will return to room when done.

## 2021-04-06 NOTE — Progress Notes (Signed)
Currently under review at [facility] per [facility], admissions.   Glennie Isle, MSW, [place], LCAS-A Phone: [phone] Disposition/TOC

## 2021-04-06 NOTE — ED Provider Notes (Signed)
Emergency Medicine Observation Re-evaluation Note  BRAYLYN RAPIER is a 50 y.o. male, seen on rounds today.  Pt initially presented to the ED for complaints of Abdominal Pain Currently, the patient is awaiting placement.  Physical Exam  BP 123/72   Pulse 62   Temp 98.4 F (36.9 C) (Oral)   Resp 14   SpO2 100%  Physical Exam General: Calm.  Cardiac: Well perfused.  Lungs: Even, unlabored respirations.  Psych: Calm.   ED Course / MDM  EKG:EKG Interpretation  Date/Time:  Saturday April 06 2021 04:26:48 EDT Ventricular Rate:  87 PR Interval:  162 QRS Duration: 83 QT Interval:  397 QTC Calculation: 478 R Axis:   2 Text Interpretation: Sinus rhythm Low voltage, precordial leads Probable anteroseptal infarct, old Confirmed by Gerlene Fee [phone]) on 04/06/2021 4:45:50 AM  I have reviewed the labs performed to date as well as medications administered while in observation.  Recent changes in the last 24 hours include patient seen by TTS. Patient meets inpatient criteria.   Plan  Current plan is for TTS evaluation and Nephrology consultation. Patient has missed the last two HD sessions. No indication for emergent HD.  07:30 AM  Spoke with Nephrology on call. They will arrange HD while patient is awaiting placement. Home medications ordered. SSI ordered.   ELIUS CHIPLEY is not under involuntary commitment.     Margette Fast, MD 04/07/21 [phone]

## 2021-04-06 NOTE — ED Notes (Signed)
Patient in hallway at this time. Patient asleep and snoring.

## 2021-04-06 NOTE — ED Notes (Signed)
Patient woke up coughing as if he was going to throw up. Patient coughed and gagged. Got patient a vomit bag, patient spit up clear frothy sputum like substance. RN called to bedside. Patient vitals taken and EDP called to bedside at this time.

## 2021-04-06 NOTE — Progress Notes (Signed)
Per Clovis Riley, patient meets criteria for inpatient treatment. There are no available or appropriate beds at [facility] today. CSW faxed referrals to the following facilities for review:  [facility]  Pending - No Request Sent N/A [address] [place] [address] [phone] --  [place]  Pending - No Request Sent N/A [place] [place] [address] [place] --  [facility]  Pending - No Request Sent N/A [address] [place] [address] [id number] --  [place]  Pending - No Request Sent N/A [place], [place] [address] [phone] --  [facility]  Pending - No Request Sent N/A [address] [place], [place] [address] [phone] --  [facility]  Pending - No Request Sent N/A [address] [place] [address] [place] --  [facility]  Pending - No Request Sent N/A [address] [place] [address] [phone] --  [facility]  Pending - No Request Sent N/A [address] [place] [address] [phone] --  [facility]  Pending - No Request Sent N/A [address], [place] [address] [id number] [phone] --  [facility]  Pending - No Request Sent N/A [place]., [place] [address] [phone] --  [place]  Pending - No Request Sent N/A [place]., [place] [address] [phone] --  [facility]  Pending - No Request Sent N/A [place], [place] [address] [id number] --  [place]  Pending - No Request Sent N/A [address] [place] [address] [phone] --   [facility]  Pending - No Request Sent N/A [address] [place] [address] [phone] --  [facility]  Pending - No Request Sent N/A [address] [place] [address] [phone] --  [facility]  Pending - No Request Sent N/A [place], [place] [address] [id number] [phone] --  [facility]  Pending - No Request Sent N/A [address] [place] [address] [id number] --  [place] No Request Sent N/A [address] [place] [address] [phone] --   TTS will continue to seek bed placement.  Glennie Isle, MSW, [place], LCAS-A Phone: [phone] Disposition/TOC

## 2021-04-06 NOTE — ED Notes (Signed)
Patient on 12 lead at this time.
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
## 2021-04-06 NOTE — ED Notes (Signed)
Patient still asleep at this time. Patient is behaving age appropriate.

## 2021-04-06 NOTE — ED Notes (Signed)
Patient resting well at this time. Patient has sleep apnea. Once 02 was on patient respirations are equal and unlabored.

## 2021-04-06 NOTE — ED Notes (Signed)
Pt will be moved to [address] in order to be monitored by sitter.

## 2021-04-07 DIAGNOSIS — I120 Hypertensive chronic kidney disease with stage 5 chronic kidney disease or end stage renal disease: Secondary | ICD-10-CM | POA: Diagnosis not present

## 2021-04-07 DIAGNOSIS — R45851 Suicidal ideations: Secondary | ICD-10-CM | POA: Diagnosis not present

## 2021-04-07 DIAGNOSIS — E876 Hypokalemia: Secondary | ICD-10-CM | POA: Diagnosis not present

## 2021-04-07 DIAGNOSIS — N186 End stage renal disease: Secondary | ICD-10-CM | POA: Diagnosis not present

## 2021-04-07 LAB — CBG MONITORING, ED
Glucose-Capillary: 93 mg/dL (ref 70–99)
Glucose-Capillary: 148 mg/dL — ABNORMAL HIGH (ref 70–99)
Glucose-Capillary: 177 mg/dL — ABNORMAL HIGH (ref 70–99)
Glucose-Capillary: 166 mg/dL — ABNORMAL HIGH (ref 70–99)

## 2021-04-07 LAB — RAPID URINE DRUG SCREEN, HOSP PERFORMED
Amphetamines: NOT DETECTED
Barbiturates: NOT DETECTED
Benzodiazepines: POSITIVE — AB
Cocaine: NOT DETECTED
Opiates: NOT DETECTED
Tetrahydrocannabinol: NOT DETECTED

## 2021-04-07 LAB — HEPATITIS B SURFACE ANTIGEN: Hepatitis B Surface Ag: NONREACTIVE
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
## 2021-04-07 NOTE — ED Notes (Signed)
Pt asleep in bed.

## 2021-04-07 NOTE — BHH Counselor (Signed)
Requested cart for reax

## 2021-04-07 NOTE — ED Notes (Signed)
Pt ate dinner.

## 2021-04-07 NOTE — ED Notes (Signed)
Patient provided with dinner tray at bedside and is eating.

## 2021-04-08 ENCOUNTER — Ambulatory Visit: Payer: Medicare HMO | Admitting: Podiatry

## 2021-04-08 DIAGNOSIS — E876 Hypokalemia: Secondary | ICD-10-CM | POA: Diagnosis not present

## 2021-04-08 DIAGNOSIS — I120 Hypertensive chronic kidney disease with stage 5 chronic kidney disease or end stage renal disease: Secondary | ICD-10-CM | POA: Diagnosis not present

## 2021-04-08 DIAGNOSIS — R45851 Suicidal ideations: Secondary | ICD-10-CM | POA: Diagnosis not present

## 2021-04-08 DIAGNOSIS — N186 End stage renal disease: Secondary | ICD-10-CM | POA: Diagnosis not present

## 2021-04-08 LAB — CBG MONITORING, ED
Glucose-Capillary: 105 mg/dL — ABNORMAL HIGH (ref 70–99)
Glucose-Capillary: 172 mg/dL — ABNORMAL HIGH (ref 70–99)

## 2021-04-08 MED ORDER — SUCRALFATE 1 G PO TABS: 1.0000 g | ORAL_TABLET | Freq: Three times a day (TID) | ORAL | Status: DC

## 2021-04-08 MED ORDER — ACETAMINOPHEN 325 MG PO TABS
650.0000 mg | ORAL_TABLET | Freq: Four times a day (QID) | ORAL | Status: DC | PRN
  Administered 2021-04-08: 650 mg via ORAL
  Filled 2021-04-08: qty 2

## 2021-04-08 MED ORDER — OXYCODONE HCL 5 MG PO TABS
5.0000 mg | ORAL_TABLET | Freq: Once | ORAL | Status: AC
  Administered 2021-04-08: 5 mg via ORAL
  Filled 2021-04-08: qty 1

## 2021-04-08 NOTE — Progress Notes (Signed)
Admit: 04/05/2021 LOS: 0  27M ESRD [facility] here with SI, awaiting acute inpatient psychiatric hospital  Subjective:  No interval events HD 8/27, uneventful; 3L UF Pt w/o complaints  08/28 0701 - 08/29 0700 In: 720 [P.O.:720] Out: 200 [Urine:200]  There were no vitals filed for this visit.  Scheduled Meds:  amLODipine  5 mg Oral Daily   dicyclomine  10 mg Oral TID AC & HS   divalproex  750 mg Oral TID   insulin aspart  0-6 Units Subcutaneous TID WC   linagliptin  5 mg Oral Daily   sucralfate  1 g Oral QID   ziprasidone  20-40 mg Oral See admin instructions   Continuous Infusions:  sodium chloride     sodium chloride     PRN Meds:.sodium chloride, sodium chloride, ALPRAZolam, ondansetron, pentafluoroprop-tetrafluoroeth  Current Labs: reviewed    Physical Exam:  Blood pressure 124/69, pulse 70, temperature 98.2 F (36.8 C), temperature source Oral, resp. rate 19, SpO2 97 %. GEN: NAD, chronically ill-appearing' eating breakfast ENT: NCAT EYES: EOMI CV: Regular, normal S1 and S2 PULM: Clear bilaterally ABD: Soft, nontender SKIN: No rashes or lesions EXT: No significant peripheral edema VASCULAR: Left upper extremity AV graft with bruit and thrill   A SI / BPAD: per psych awaiting inpatient admission ESRD: missed tx x2 prior to arrival; cont on THS schedule for now: 3.5h, 3K, 2-3L UF, AVG 400/600, no heparin HTN/Vol: trend BPs with UF CKD-BMD: Ca ok, f/u P ANemia: Hb 10s, CTM  P As above Medication Issues; Preferred narcotic agents for pain control are hydromorphone, fentanyl, and methadone. Morphine should not be used.  Baclofen should be avoided Avoid oral sodium phosphate and magnesium citrate based laxatives / bowel preps    Pearson Grippe MD 04/08/2021, 9:00 AM  Recent Labs  Lab 03/11/2021 0911 04/05/21 2002 04/06/21 0413 04/06/21 0416  NA 133* 127* 132* 132*  K 3.3* 3.0* 3.7 3.4*  CL 99 96* 97* 96*  CO2 25 21* 24  --   GLUCOSE 156* 105* 91 86   BUN 55* 67* 73* 63*  CREATININE 6.39* 9.45* 10.55* 10.90*  CALCIUM 8.8* 8.0* 8.5*  --    Recent Labs  Lab 03/29/2021 0911 04/05/21 2002 04/06/21 0413 04/06/21 0416  WBC 6.3 7.3 6.8  --   NEUTROABS 4.6 4.8  --   --   HGB 11.2* 10.0* 10.9* 10.9*  HCT 32.6* 28.3* 31.1* 32.0*  MCV 104.2* 103.3* 104.4*  --   PLT 109* 113* 114*  --

## 2021-04-08 NOTE — ED Notes (Signed)
Pt given lunch tray.

## 2021-04-08 NOTE — ED Notes (Signed)
Breakfast tray placed at bedside.  

## 2021-04-08 NOTE — ED Provider Notes (Signed)
Emergency Medicine Observation Re-evaluation Note  Timothy Clark is a 50 y.o. male, seen on rounds today.  Pt initially presented to the ED for complaints of abdominal pain, and also shared intermittent feelings of anxiety/stress and recent feelings of depression. Patient reports is feeling much improved. States earlier event 'was blown out of proportion'. States does not want to harm or kill self. States was medicated for abd pain and feels improved. Requests dressing to left great toe ulcer - denies drainage, redness, or increased pain to area, but states dressing was taken off prior and needs to be put back on - states had wound care appt today and will need to reschedule. Pt does not feel he wound benefit from remaining in ED or from inpatient psychiatric care - states he feels he would do better, be happier, as outpatient, and does say he is willing to follow up as outpt. Denies chest pain or sob. No weakness. Normal appetite. No new physical c/o.   Physical Exam  BP 132/79   Pulse 77   Temp 98.2 F (36.8 C) (Oral)   Resp 11   SpO2 94%  Physical Exam General: calm, alert, conversant.  Cardiac: regular rate.  Lungs: breathing comfortably. Psych: normal mood and affect. Pt denies any thoughts of harm to self or others. He does not appear to be acutely depressed. He does acknowledge periods of anxiety/sadness, but states from that standpoint he feels fine/stable/consistent with baseline. Pt is not responding to internal stimuli. No hallucinations or delusions. Expresses clear and logical thoughts. Expresses optimism about returning home, and outpt f/u. No acute psychosis is noted.   ED Course / MDM    I have reviewed the labs performed to date as well as medications administered while in observation.  Recent changes in the last 24 hours include ED observation, stabilization, and reassessment.   Plan  Pt reports feeling much improved and requests d/c. On exam, there is no psychosis, pt  does not appear depressed or despondent, and he denies any thoughts of harm to self.   Pt denies physical c/o, no chest pain or sob. No weakness or faintness.   Pt currently appears stable for d/c.   Rec pcp, HD, and BH f/u.  Return precautions provided.        Lajean Saver, MD 04/08/21 [phone]

## 2021-04-08 NOTE — Discharge Instructions (Addendum)
It was our pleasure to provide your ER care today - we hope that you feel better.  Follow up with primary care doctor in the coming week - have them assist with coordinating your health care needs, including your behavioral health follow up.  See resource guide provided as relates other behavioral health resources.   Contact your dialysis center today or tomorrow AM to facilitate/confirm your next dialysis time tomorrow.   Also contact your wound care provider to reschedule that visit.   Return to ER if worse, new symptoms, fevers, new or severe pain, vomiting, trouble breathing, weak/fainting, or other concern.

## 2021-04-08 NOTE — Progress Notes (Signed)
Patient has been denied by [facility] due to no bed available. Patient has been recommended for med/psych inpatient placement due to him being currently on dialysis. Patient meets inpatient criteria per Ene Ajibola,NP. Patient referred to the following facilities:  [facility]  [address] [place] [address] [phone] [place]  [address] [place] [address] [place]  [facility]  [address] [place] [address] [phone] [place]  [place], [place] [address] [phone] [facility]  [address] [place], [place] [address] [phone] [facility]  [place] [address] [place] [address] [place]  [facility]  [address] [place] [address] [phone]  [facility]  [address], [place] [address] [phone]  [place] [place] [address] [phone]  [facility]  [place] [address] [phone]  [facility]  [address] [place] [address] [phone] [facility]  [address] [place] [address] [phone]  [facility]  [address] [place] [address] [phone] [facility]  [address] [place] [address] [phone] [place]  [address] Madelaine Bhat [place] [address] [phone]  [facility]  [place], [place] [address] [place]  [facility]  [address], [place] [address] [phone] [place] [address] [place] [address] [phone]    CSW will continue to monitor disposition.    Mariea Clonts, MSW, LCSW-A  11:54 AM 04/08/2021

## 2021-04-10 DIAGNOSIS — K219 Gastro-esophageal reflux disease without esophagitis: Secondary | ICD-10-CM | POA: Diagnosis not present

## 2021-04-10 DIAGNOSIS — Z992 Dependence on renal dialysis: Secondary | ICD-10-CM | POA: Diagnosis not present

## 2021-04-10 DIAGNOSIS — N186 End stage renal disease: Secondary | ICD-10-CM | POA: Diagnosis not present

## 2021-04-10 DIAGNOSIS — E1165 Type 2 diabetes mellitus with hyperglycemia: Secondary | ICD-10-CM | POA: Diagnosis not present

## 2021-04-11 ENCOUNTER — Telehealth: Payer: Self-pay | Admitting: Internal Medicine

## 2021-04-11 ENCOUNTER — Encounter: Payer: Self-pay | Admitting: Internal Medicine

## 2021-04-11 DIAGNOSIS — 419620001 Death: Secondary | SNOMED CT | POA: Diagnosis not present

## 2021-04-11 NOTE — Telephone Encounter (Signed)
Pt needs OV he was last seen 07/2020. Letter to call office to schedule procedure was mailed 09/2020 after not being able to reach him.

## 2021-04-11 NOTE — Telephone Encounter (Signed)
Please advise if patient needs NV or OV to schedule his first colonoscopy. [phone]

## 2021-04-11 NOTE — Telephone Encounter (Signed)
Noted  

## 2021-04-11 NOTE — Telephone Encounter (Signed)
Looks like pt is reaching out to [place] from letter that he received.  Routing to [facility] Clinical Pool.

## 2021-04-11 DEATH — deceased

## 2021-04-12 DIAGNOSIS — Z992 Dependence on renal dialysis: Secondary | ICD-10-CM | POA: Diagnosis not present

## 2021-04-12 DIAGNOSIS — N186 End stage renal disease: Secondary | ICD-10-CM | POA: Diagnosis not present

## 2021-04-15 DIAGNOSIS — E1122 Type 2 diabetes mellitus with diabetic chronic kidney disease: Secondary | ICD-10-CM | POA: Diagnosis not present

## 2021-04-15 DIAGNOSIS — N186 End stage renal disease: Secondary | ICD-10-CM | POA: Diagnosis not present

## 2021-04-15 DIAGNOSIS — E118 Type 2 diabetes mellitus with unspecified complications: Secondary | ICD-10-CM | POA: Diagnosis not present

## 2021-04-15 DIAGNOSIS — Z992 Dependence on renal dialysis: Secondary | ICD-10-CM | POA: Diagnosis not present

## 2021-04-15 DIAGNOSIS — N183 Chronic kidney disease, stage 3 unspecified: Secondary | ICD-10-CM | POA: Diagnosis not present

## 2021-04-15 DIAGNOSIS — M86179 Other acute osteomyelitis, unspecified ankle and foot: Secondary | ICD-10-CM | POA: Diagnosis not present

## 2021-04-16 DIAGNOSIS — E113593 Type 2 diabetes mellitus with proliferative diabetic retinopathy without macular edema, bilateral: Secondary | ICD-10-CM | POA: Diagnosis not present

## 2021-04-16 DIAGNOSIS — Z961 Presence of intraocular lens: Secondary | ICD-10-CM | POA: Diagnosis not present

## 2021-04-16 DIAGNOSIS — H401133 Primary open-angle glaucoma, bilateral, severe stage: Secondary | ICD-10-CM | POA: Diagnosis not present

## 2021-04-16 DIAGNOSIS — Z9889 Other specified postprocedural states: Secondary | ICD-10-CM | POA: Diagnosis not present

## 2021-04-17 DIAGNOSIS — Z992 Dependence on renal dialysis: Secondary | ICD-10-CM | POA: Diagnosis not present

## 2021-04-17 DIAGNOSIS — N186 End stage renal disease: Secondary | ICD-10-CM | POA: Diagnosis not present

## 2021-04-19 DIAGNOSIS — N186 End stage renal disease: Secondary | ICD-10-CM | POA: Diagnosis not present

## 2021-04-19 DIAGNOSIS — Z992 Dependence on renal dialysis: Secondary | ICD-10-CM | POA: Diagnosis not present

## 2021-04-22 DIAGNOSIS — N186 End stage renal disease: Secondary | ICD-10-CM | POA: Diagnosis not present

## 2021-04-22 DIAGNOSIS — Z992 Dependence on renal dialysis: Secondary | ICD-10-CM | POA: Diagnosis not present

## 2021-04-24 DIAGNOSIS — N186 End stage renal disease: Secondary | ICD-10-CM | POA: Diagnosis not present

## 2021-04-24 DIAGNOSIS — Z992 Dependence on renal dialysis: Secondary | ICD-10-CM | POA: Diagnosis not present

## 2021-04-26 DIAGNOSIS — Z992 Dependence on renal dialysis: Secondary | ICD-10-CM | POA: Diagnosis not present

## 2021-04-26 DIAGNOSIS — N186 End stage renal disease: Secondary | ICD-10-CM | POA: Diagnosis not present

## 2021-04-29 ENCOUNTER — Ambulatory Visit (INDEPENDENT_AMBULATORY_CARE_PROVIDER_SITE_OTHER): Payer: Medicare HMO | Admitting: Podiatry

## 2021-04-29 ENCOUNTER — Other Ambulatory Visit: Payer: Self-pay

## 2021-04-29 ENCOUNTER — Ambulatory Visit (INDEPENDENT_AMBULATORY_CARE_PROVIDER_SITE_OTHER): Payer: Medicare HMO

## 2021-04-29 DIAGNOSIS — L97522 Non-pressure chronic ulcer of other part of left foot with fat layer exposed: Secondary | ICD-10-CM | POA: Diagnosis not present

## 2021-04-29 DIAGNOSIS — E08621 Diabetes mellitus due to underlying condition with foot ulcer: Secondary | ICD-10-CM

## 2021-04-29 NOTE — Progress Notes (Signed)
Subjective:  50 y.o. male with PMHx of diabetes mellitus, ESRD, presenting for follow-up evaluation of an ulcer that is developed to the left hallux.  Patient states that the nurse has not come out to the house for the last 2 weeks now.  They have been changing dressings at home.  No new complaints at this time  Past Medical History:  Diagnosis Date   Anemia    Anxiety    CHF (congestive heart failure) (HCC)    diastolic dysfunction   Chronic abdominal pain    COVID-19 07/2019   Depression    Esophagitis    ESRD on hemodialysis ([place])    undergoing evaluation for transplant   Eye hemorrhage    Gastritis    Gastroparesis    GERD (gastroesophageal reflux disease)    Glaucoma    Helicobacter pylori (H. pylori) infection 2016   ERADICATION DOCUMENTED VIA STOOL TEST in AUG 2016 at [facility]   Hiccups    Hypertension    Nausea and vomiting    chronic, recurrent   Neuropathy    feet   Renal insufficiency    Type 2 diabetes mellitus with complications ([place])    diagnosed around age 59   Vitreous hemorrhage of left eye ([place]) 12/15/2019      Objective/Physical Exam General: The patient is alert and oriented x3 in no acute distress.  Dermatology:  Wound #1 noted to the left hallux measuring approximately 0.8 x 0.8 x 0.2 cm (LxWxD).   To the noted ulceration(s), there is no eschar. There is a moderate amount of slough, fibrin, and necrotic tissue noted. Granulation tissue and wound base is red. There is a minimal amount of serosanguineous drainage noted. There is no exposed bone muscle-tendon ligament or joint. There is no malodor. Periwound integrity is intact. Skin is warm, dry and supple bilateral lower extremities.  Thickened, hyperkeratotic, dystrophic, elongated nails also noted bilateral  Vascular: Palpable pedal pulses bilaterally. No edema or erythema noted. Capillary refill within normal limits.  Neurological: Epicritic and protective threshold diminished bilaterally.    Musculoskeletal Exam: History of partial first ray amputation right foot  Radiographic exam: Normal osseous mineralization.  No osteolytic process underneath the left great toe.  No concern at the moment for osteomyelitis of the  Assessment: 1.  Ulcer left hallux  secondary to diabetes mellitus 2. diabetes mellitus w/ peripheral neuropathy 3. H/o partial first ray amputation right foot   Plan of Care:  1. Patient was evaluated.  2. medically necessary excisional debridement including subcutaneous tissue was performed using a tissue nipper and a chisel blade. Excisional debridement of all the necrotic nonviable tissue down to healthy bleeding viable tissue was performed with post-debridement measurements same as pre-. 3. the wound was cleansed and dry sterile dressing applied. 4.  [place] apparently discontinued dressing changes at the house because they need further orders.  New orders will be sent to them today.  Orders: Cleanse with normal saline.  Apply gentamicin cream with a light dressing 2x/week 5.  Continue diabetic shoes and insoles with the felt offloading dancers pads 6.  Return to clinic 6 weeks 7.  Patient was denied skin graft application  Edrick Kins, DPM [facility]  Dr. Edrick Kins, DPM    [address]  [place], [place] [address]                Office [phone]  Fax [phone]

## 2021-04-30 DIAGNOSIS — Z992 Dependence on renal dialysis: Secondary | ICD-10-CM | POA: Diagnosis not present

## 2021-04-30 DIAGNOSIS — N186 End stage renal disease: Secondary | ICD-10-CM | POA: Diagnosis not present

## 2021-05-01 DIAGNOSIS — N186 End stage renal disease: Secondary | ICD-10-CM | POA: Diagnosis not present

## 2021-05-01 DIAGNOSIS — Z992 Dependence on renal dialysis: Secondary | ICD-10-CM | POA: Diagnosis not present

## 2021-05-04 ENCOUNTER — Encounter (HOSPITAL_COMMUNITY): Payer: Self-pay | Admitting: *Deleted

## 2021-05-04 ENCOUNTER — Other Ambulatory Visit: Payer: Self-pay

## 2021-05-04 ENCOUNTER — Emergency Department (HOSPITAL_COMMUNITY): Payer: Medicare HMO

## 2021-05-04 ENCOUNTER — Emergency Department (HOSPITAL_COMMUNITY)
Admission: EM | Admit: 2021-05-04 | Discharge: 2021-05-04 | Disposition: A | Discharge status: Home / Self Care | Payer: Medicare HMO | Attending: Emergency Medicine | Admitting: Emergency Medicine

## 2021-05-04 DIAGNOSIS — E1110 Type 2 diabetes mellitus with ketoacidosis without coma: Secondary | ICD-10-CM | POA: Insufficient documentation

## 2021-05-04 DIAGNOSIS — Z8616 Personal history of COVID-19: Secondary | ICD-10-CM | POA: Insufficient documentation

## 2021-05-04 DIAGNOSIS — Z794 Long term (current) use of insulin: Secondary | ICD-10-CM | POA: Diagnosis not present

## 2021-05-04 DIAGNOSIS — I5030 Unspecified diastolic (congestive) heart failure: Secondary | ICD-10-CM | POA: Insufficient documentation

## 2021-05-04 DIAGNOSIS — I132 Hypertensive heart and chronic kidney disease with heart failure and with stage 5 chronic kidney disease, or end stage renal disease: Secondary | ICD-10-CM | POA: Insufficient documentation

## 2021-05-04 DIAGNOSIS — I10 Essential (primary) hypertension: Secondary | ICD-10-CM | POA: Diagnosis not present

## 2021-05-04 DIAGNOSIS — E1122 Type 2 diabetes mellitus with diabetic chronic kidney disease: Secondary | ICD-10-CM | POA: Diagnosis not present

## 2021-05-04 DIAGNOSIS — N186 End stage renal disease: Secondary | ICD-10-CM | POA: Insufficient documentation

## 2021-05-04 DIAGNOSIS — R1013 Epigastric pain: Secondary | ICD-10-CM | POA: Insufficient documentation

## 2021-05-04 DIAGNOSIS — R1111 Vomiting without nausea: Secondary | ICD-10-CM | POA: Diagnosis not present

## 2021-05-04 DIAGNOSIS — Z79899 Other long term (current) drug therapy: Secondary | ICD-10-CM | POA: Insufficient documentation

## 2021-05-04 DIAGNOSIS — N261 Atrophy of kidney (terminal): Secondary | ICD-10-CM | POA: Diagnosis not present

## 2021-05-04 DIAGNOSIS — Z992 Dependence on renal dialysis: Secondary | ICD-10-CM | POA: Insufficient documentation

## 2021-05-04 DIAGNOSIS — M8448XA Pathological fracture, other site, initial encounter for fracture: Secondary | ICD-10-CM | POA: Diagnosis not present

## 2021-05-04 DIAGNOSIS — R112 Nausea with vomiting, unspecified: Secondary | ICD-10-CM

## 2021-05-04 DIAGNOSIS — E11319 Type 2 diabetes mellitus with unspecified diabetic retinopathy without macular edema: Secondary | ICD-10-CM | POA: Insufficient documentation

## 2021-05-04 DIAGNOSIS — K4090 Unilateral inguinal hernia, without obstruction or gangrene, not specified as recurrent: Secondary | ICD-10-CM | POA: Diagnosis not present

## 2021-05-04 LAB — COMPREHENSIVE METABOLIC PANEL
ALT: 15 U/L (ref 0–44)
AST: 22 U/L (ref 15–41)
Albumin: 3.6 g/dL (ref 3.5–5.0)
Alkaline Phosphatase: 58 U/L (ref 38–126)
Anion gap: 10 (ref 5–15)
BUN: 55 mg/dL — ABNORMAL HIGH (ref 6–20)
CO2: 24 mmol/L (ref 22–32)
Calcium: 9.3 mg/dL (ref 8.9–10.3)
Chloride: 104 mmol/L (ref 98–111)
Creatinine, Ser: 5.72 mg/dL — ABNORMAL HIGH (ref 0.61–1.24)
GFR, Estimated: 11 mL/min — ABNORMAL LOW (ref >60)
Glucose, Bld: 108 mg/dL — ABNORMAL HIGH (ref 70–99)
Potassium: 4 mmol/L (ref 3.5–5.1)
Sodium: 138 mmol/L (ref 135–145)
Total Bilirubin: 0.6 mg/dL (ref 0.3–1.2)
Total Protein: 7.5 g/dL (ref 6.5–8.1)

## 2021-05-04 LAB — CBC WITH DIFFERENTIAL/PLATELET
Abs Immature Granulocytes: 0.02 10*3/uL (ref 0.00–0.07)
Basophils Absolute: 0 10*3/uL (ref 0.0–0.1)
Basophils Relative: 0 %
Eosinophils Absolute: 0.1 10*3/uL (ref 0.0–0.5)
Eosinophils Relative: 1 %
HCT: 32.8 % — ABNORMAL LOW (ref 39.0–52.0)
Hemoglobin: 11.3 g/dL — ABNORMAL LOW (ref 13.0–17.0)
Immature Granulocytes: 0 %
Lymphocytes Relative: 23 %
Lymphs Abs: 1.2 10*3/uL (ref 0.7–4.0)
MCH: 36.2 pg — ABNORMAL HIGH (ref 26.0–34.0)
MCHC: 34.5 g/dL (ref 30.0–36.0)
MCV: 105.1 fL — ABNORMAL HIGH (ref 80.0–100.0)
Monocytes Absolute: 0.5 10*3/uL (ref 0.1–1.0)
Monocytes Relative: 9 %
Neutro Abs: 3.6 10*3/uL (ref 1.7–7.7)
Neutrophils Relative %: 67 %
Platelets: 146 10*3/uL — ABNORMAL LOW (ref 150–400)
RBC: 3.12 MIL/uL — ABNORMAL LOW (ref 4.22–5.81)
RDW: 13.3 % (ref 11.5–15.5)
WBC: 5.3 10*3/uL (ref 4.0–10.5)
nRBC: 0 % (ref 0.0–0.2)

## 2021-05-04 LAB — LIPASE, BLOOD: Lipase: 34 U/L (ref 11–51)

## 2021-05-04 LAB — TROPONIN I (HIGH SENSITIVITY)
Troponin I (High Sensitivity): 12 ng/L (ref <18)
Troponin I (High Sensitivity): 11 ng/L (ref <18)

## 2021-05-04 MED ORDER — MORPHINE SULFATE (PF) 4 MG/ML IV SOLN
4.0000 mg | Freq: Once | INTRAVENOUS | Status: AC
Start: 2021-05-04 — End: 2021-05-04
  Administered 2021-05-04: 4 mg via INTRAMUSCULAR

## 2021-05-04 MED ORDER — METOCLOPRAMIDE HCL 5 MG/ML IJ SOLN
10.0000 mg | Freq: Once | INTRAMUSCULAR | Status: AC
  Administered 2021-05-04: 10 mg via INTRAMUSCULAR

## 2021-05-04 MED ORDER — MORPHINE SULFATE (PF) 4 MG/ML IV SOLN
4.0000 mg | Freq: Once | INTRAVENOUS | Status: DC
  Filled 2021-05-04: qty 1

## 2021-05-04 MED ORDER — METOCLOPRAMIDE HCL 5 MG/ML IJ SOLN
10.0000 mg | Freq: Once | INTRAMUSCULAR | Status: DC
  Filled 2021-05-04: qty 2

## 2021-05-04 MED ORDER — HYDROMORPHONE HCL 1 MG/ML IJ SOLN
1.0000 mg | Freq: Once | INTRAMUSCULAR | Status: AC
Start: 2021-05-04 — End: 2021-05-04
  Administered 2021-05-04: 1 mg via INTRAMUSCULAR
  Filled 2021-05-04: qty 1

## 2021-05-04 NOTE — ED Notes (Signed)
Pt complains of having sharp epigastric pain that is constant. Says he went to dialysis yesterday and plans on going to [facility] Monday at 1pm.

## 2021-05-04 NOTE — Discharge Instructions (Addendum)
Your symptoms may be due to complications with your diabetes, a condition called diabetic gastroparesis.   I recommend taking your Bentyl dose twice daily for the next week instead of just when your symptoms get worse as taking it regularly may prevent these episodes of severe pain.  Call Dr. Coralee North office to schedule an appointment and to get your colonoscopy scheduled.

## 2021-05-04 NOTE — ED Notes (Signed)
Pt is a hard stick 

## 2021-05-04 NOTE — ED Triage Notes (Signed)
States he has abdominal pain that started this am.also has not had dialysis because his shunt is clogged. Last dialysis 3 days ago

## 2021-05-04 NOTE — ED Provider Notes (Signed)
[facility] EMERGENCY DEPARTMENT Provider Note   CSN: [id number] Arrival date & time: 05/04/21  1345     History Chief Complaint  Patient presents with   Abdominal Pain    Timothy Clark is a 50 y.o. male with a history including CHF, end-stage renal disease on dialysis Monday Wednesday Friday, history of esophagitis and gastritis, GERD, gastroparesis also hypertension presenting for evaluation of nausea and vomiting along with epigastric pain that started this morning.  He states this feels like his gastroparesis.  He used to be on Reglan but was discontinued from this medication for unclear reasons, did try using Zofran for nausea which was not effective.  He describes waves of pain without radiation in his stomach.  He has Bentyl which she uses as needed and tried this medication this morning without any relief.  He denies fevers or chills, diarrhea, abdominal distention and back pain.  He states he has a symptoms at least once per week, can sometimes get it under control with Bentyl which has not been helpful today.  He is a patient of Dr. Gala Romney, states he is arranging a screening colonoscopy.  He also reports that his dialysis graft became clotted on Friday to did not undergo full treatment and is scheduled to see a vascular surgeon in 2 days to have this fixed.  The history is provided by the patient.      Past Medical History:  Diagnosis Date   Anemia    Anxiety    CHF (congestive heart failure) (HCC)    diastolic dysfunction   Chronic abdominal pain    COVID-19 07/2019   Depression    Esophagitis    ESRD on hemodialysis ([place])    undergoing evaluation for transplant   Eye hemorrhage    Gastritis    Gastroparesis    GERD (gastroesophageal reflux disease)    Glaucoma    Helicobacter pylori (H. pylori) infection 2016   ERADICATION DOCUMENTED VIA STOOL TEST in AUG 2016 at [facility]   Hiccups    Hypertension    Nausea and vomiting    chronic, recurrent   Neuropathy     feet   Renal insufficiency    Type 2 diabetes mellitus with complications ([place])    diagnosed around age 80   Vitreous hemorrhage of left eye ([place]) 12/15/2019    Patient Active Problem List   Diagnosis Date Noted   Seizures ([place]) 01/02/2021   Seizure ([place]) 01/02/2021   Problem with vascular access 09/17/2020   Bipolar disorder ([place]) 09/17/2020   Acute encephalopathy 07/24/2020   Intractable abdominal pain 07/23/2020   Encephalopathy    Colon cancer screening 07/12/2020   Optic atrophy, left eye 07/03/2020   Stable treated proliferative diabetic retinopathy of right eye determined by examination associated with type 2 diabetes mellitus ([place]) 07/03/2020   Anxiety 03/13/2020   History of COVID-19 03/13/2020   History of gastroesophageal reflux (GERD) 48/54/6270   History of Helicobacter pylori infection 03/13/2020   Pre-transplant evaluation for kidney transplant 03/13/2020   Renovascular hypertension 03/13/2020   Hematemesis 03/02/2020   Coffee ground emesis 35/00/9381   Complication of vascular access for dialysis 01/29/2020   Follow-up examination after eye surgery 12/15/2019   Type 2 diabetes with nephropathy ([place]) 12/15/2019   Controlled diabetes mellitus with stable proliferative retinopathy of left eye, with long-term current use of insulin ([place]) 12/15/2019   Secondary glaucoma due to combination mechanisms, left, severe stage 12/15/2019   Nausea with vomiting 12/08/2019   Hyperglycemia due  to type 2 diabetes mellitus ([place]) 10/28/2019   DKA (diabetic ketoacidoses) 10/27/2019   ESRD on dialysis ([place]) 06/26/2019   Constipation 03/15/2019   Chronic renal insufficiency, stage V ([place]) 02/21/2019   Type 2 diabetes mellitus with proliferative retinopathy ([place]) 10/25/2018   Acute renal failure superimposed on stage 4 chronic kidney disease ([place]) 10/25/2018   Extravasation injury of IV catheter site with other complication, initial encounter ([place])    Intractable vomiting    Chronic  kidney disease    Poor venous access    Gastroparesis 10/24/2018   Closed displaced fracture of lateral end of right clavicle 06/18/2018   HTN (hypertension), malignant 06/13/2018   Scrotal swelling 11/10/2017   Swelling 11/09/2017   Scrotal edema    Obesity, Class III, BMI 40-49.9 (morbid obesity) ([place]) 11/02/2017   CKD stage 3 due to type 2 diabetes mellitus ([place]) 10/30/2017   Wound eschars of foot, right  10/30/2017   Chronic abdominal pain 09/21/2017   Hypertensive urgency 09/21/2017   Prolonged QT interval 09/21/2017   GERD (gastroesophageal reflux disease) 07/14/2017   Glaucoma 07/14/2017   Anemia 07/14/2017   Pressure injury of skin 02/01/2017   Insulin dependent diabetes mellitus 09/07/2016   Orthostatic syncope 09/05/2016   Dumping syndrome 04/22/2016   Gastroparesis due to DM ([place]) 02/06/2015   Essential hypertension 12/06/2014   Diabetic macular edema ([place]) 02/18/2014   PDR (proliferative diabetic retinopathy) ([place]) 02/06/2014   Neovascular glaucoma of left eye 01/11/2014   Diabetic infection of left heel ([place]) 02/14/2010   UNSPECIFIED PERIPHERAL VASCULAR DISEASE 10/19/2009   ERECTILE DYSFUNCTION, ORGANIC 10/19/2009   NUMBNESS 07/20/2009   Diabetes mellitus type 2 with complications, uncontrolled ([place]) 03/22/2009   Hyperlipidemia 03/22/2009   Depression 03/22/2009    Past Surgical History:  Procedure Laterality Date   AMPUTATION TOE Right    great toe   AV FISTULA INSERTION W/ RF MAGNETIC GUIDANCE Left 04/06/2019   Procedure: AV FISTULA INSERTION W/RF MAGNETIC GUIDANCE;  Surgeon: Katha Cabal, MD;  Location: [facility];  Service: Cardiovascular;  Laterality: Left;   AV FISTULA PLACEMENT Left 05/06/2019   Procedure: INSERTION OF ARTERIOVENOUS (AV) GORE-TEX GRAFT ARM ( FOREARM LOOP );  Surgeon: Katha Cabal, MD;  Location: [facility];  Service: Vascular;  Laterality: Left;   AV FISTULA PLACEMENT Left 03/21/2020   Procedure: INSERTION OF  ARTERIOVENOUS (AV) GORE-TEX GRAFT ARM ( BRACHIAL AXILLARY );  Surgeon: Katha Cabal, MD;  Location: [facility];  Service: Vascular;  Laterality: Left;   CATARACT EXTRACTION W/PHACO Left 05/19/2016   Procedure: CATARACT EXTRACTION PHACO AND INTRAOCULAR LENS PLACEMENT LEFT EYE;  Surgeon: Tonny Branch, MD;  Location: [facility];  Service: Ophthalmology;  Laterality: Left;  CDE: 7.30   CATARACT EXTRACTION W/PHACO Right 06/23/2016   Procedure: CATARACT EXTRACTION PHACO AND INTRAOCULAR LENS PLACEMENT RIGHT EYE CDE=9.87;  Surgeon: Tonny Branch, MD;  Location: [facility];  Service: Ophthalmology;  Laterality: Right;  right   DIALYSIS/PERMA CATHETER REMOVAL N/A 07/19/2019   Procedure: DIALYSIS/PERMA CATHETER REMOVAL;  Surgeon: Katha Cabal, MD;  Location: [facility];  Service: Cardiovascular;  Laterality: N/A;   ESOPHAGOGASTRODUODENOSCOPY  2015   Dr. Britta Mccreedy   ESOPHAGOGASTRODUODENOSCOPY N/A 10/26/2014   RMR: Distal esophagititis-likely reflux related although an element of pill induced injuruy no exclueded  status post biopsy. Diffusely abnormal gastric mucosa of uncertain signigicane -status post gastric biopsy. Focal area of excoriation in the cardia most consistant with a trauma of heaving.    ESOPHAGOGASTRODUODENOSCOPY  08/2015  [facility]: mild esophagitis and old gastric contents   EYE SURGERY Left 2015   stent placed to left eye   EYE SURGERY Right 06/2019   per patient- to remove scar tissue   INCISION AND DRAINAGE OF WOUND Right 07/16/2017   Procedure: IRRIGATION AND DEBRIDEMENT OF SOFT TISSUE OF ULCERATION RIGHT FOOT;  Surgeon: Caprice Beaver, DPM;  Location: [facility];  Service: Podiatry;  Laterality: Right;   IR THROMBECTOMY AV FISTULA W/THROMBOLYSIS/PTA INC/SHUNT/IMG LEFT Left 09/19/2020   IR US GUIDE VASC ACCESS LEFT  09/19/2020   PERIPHERAL VASCULAR THROMBECTOMY Left 09/27/2019   Procedure: PERIPHERAL VASCULAR THROMBECTOMY;  Surgeon: Katha Cabal, MD;  Location: [facility];   Service: Cardiovascular;  Laterality: Left;   PERIPHERAL VASCULAR THROMBECTOMY Left 10/27/2019   Procedure: PERIPHERAL VASCULAR THROMBECTOMY;  Surgeon: Algernon Huxley, MD;  Location: [facility];  Service: Cardiovascular;  Laterality: Left;   PERIPHERAL VASCULAR THROMBECTOMY Left 12/13/2019   Procedure: PERIPHERAL VASCULAR THROMBECTOMY;  Surgeon: Katha Cabal, MD;  Location: [facility];  Service: Cardiovascular;  Laterality: Left;       Family History  Problem Relation Age of Onset   Ovarian cancer Mother    Heart attack Father    Colon polyps Father    Cervical cancer Sister    Diabetes Sister    Colon cancer Neg Hx     Social History   Tobacco Use   Smoking status: Never   Smokeless tobacco: Never  Vaping Use   Vaping Use: Never used  Substance Use Topics   Alcohol use: No    Alcohol/week: 0.0 standard drinks   Drug use: No    Home Medications Prior to Admission medications   Medication Sig Start Date End Date Taking? Authorizing Provider  ALPRAZolam Duanne Moron) 0.5 MG tablet Take 0.5 mg by mouth 2 (two) times daily as needed for anxiety.    [provider]  amLODipine (NORVASC) 5 MG tablet Take 5 mg by mouth daily.    [provider]  atorvastatin (LIPITOR) 10 MG tablet Take 1 tablet (10 mg total) by mouth at bedtime. 10/13/20 04/05/21  Murlean Iba, MD  atorvastatin (LIPITOR) 10 MG tablet atorvastatin 10 mg tablet Patient not taking: Reported on 04/05/2021    [provider]  brimonidine (ALPHAGAN) 0.15 % ophthalmic solution Place 1 drop into both eyes in the morning and at bedtime.     [provider]  brimonidine (ALPHAGAN) 0.2 % ophthalmic solution 1 drop 2 (two) times daily. Patient not taking: Reported on 04/05/2021 02/06/21   [provider]  dicyclomine (BENTYL) 10 MG capsule Take 10 mg by mouth 4 (four) times daily -  before meals and at bedtime.    [provider]  divalproex (DEPAKOTE) 250  MG DR tablet Take 750 mg by mouth 3 (three) times daily.    [provider]  divalproex (DEPAKOTE) 500 MG DR tablet  08/30/20   [provider]  dorzolamide-timolol (COSOPT) 22.3-6.8 MG/ML ophthalmic solution Place 1 drop into both eyes 2 (two) times daily.  01/16/20   [provider]  famotidine (PEPCID) 20 MG tablet Take 20 mg by mouth 2 (two) times daily.    [provider]  folic acid (FOLVITE) 1 MG tablet Take 1 mg by mouth daily.    [provider]  gentamicin cream (GARAMYCIN) 0.1 % Apply 1 application topically 2 (two) times daily. Patient not taking: No sig reported 12/12/20   Edrick Kins, DPM  Glucagon (GVOKE HYPOPEN 2-PACK) 1 MG/0.2ML SOAJ Inject 1 Syringe into the skin as needed (hypoglycemia).    [provider]  Iron-FA-B Cmp-C-Biot-Probiotic (FUSION PLUS PO) Take 1 capsule by mouth daily.    [provider]  LEADER UNIFINE PENTIPS PLUS 32G X 4 MM MISC  05/17/20   [provider]  linezolid (ZYVOX) 600 MG tablet linezolid 600 mg tablet  TAKE 1 TABLET BY MOUTH EVERY 12 HOURS Patient not taking: No sig reported    [provider]  metoCLOPramide (REGLAN) 10 MG tablet Take 10 mg by mouth every 6 (six) hours as needed for nausea or vomiting. Patient not taking: Reported on 04/05/2021    [provider]  NOVOLOG MIX 70/30 FLEXPEN (70-30) 100 UNIT/ML FlexPen Inject 35-45 Units into the skin See admin instructions. Inject 45 units in the morning and 35 every evening 02/06/21   [provider]  ondansetron (ZOFRAN-ODT) 4 MG disintegrating tablet Take 4 mg by mouth every 8 (eight) hours as needed for nausea or vomiting.    [provider]  oxyCODONE-acetaminophen (PERCOCET) 5-325 MG tablet Take 1 tablet by mouth every 6 (six) hours as needed. 04/02/2021   Milton Ferguson, MD  pantoprazole (PROTONIX) 40 MG tablet Take 1 tablet (40 mg total) by mouth 2 (two) times daily before a meal. 08/24/18    Annitta Needs, NP  polyethylene glycol (MIRALAX / GLYCOLAX) 17 g packet Take 17 g by mouth daily as needed for mild constipation. Patient not taking: No sig reported 10/13/20   Wynetta Emery, Clanford L, MD  senna-docusate (SENOKOT-S) 8.6-50 MG tablet Take 1 tablet by mouth 2 (two) times daily. Patient not taking: Reported on 04/05/2021 10/13/20   Irwin Brakeman L, MD  sitaGLIPtin (JANUVIA) 25 MG tablet Take 25 mg by mouth daily.     [provider]  sucralfate (CARAFATE) 1 g tablet Take 1 g by mouth 4 (four) times daily.    [provider]  tiZANidine (ZANAFLEX) 2 MG tablet Take 2 mg by mouth See admin instructions. Take 1 tablet by mouth every 6 to 8 hours for pain/muscle spams Patient not taking: No sig reported    [provider]  torsemide (DEMADEX) 20 MG tablet Take 40 mg by mouth daily. At noon Patient not taking: Reported on 04/05/2021    [provider]  valproic acid (DEPAKENE) 250 MG capsule Take 750 mg by mouth 3 (three) times daily. Patient not taking: Reported on 04/05/2021    [provider]  ziprasidone (GEODON) 20 MG capsule Take 1 tab with breakfast and 2 tabs at HS Patient taking differently: Take 20-40 mg by mouth See admin instructions. Take 20 mg capsule with breakfast and 40 mg at bedtime 10/13/20   Murlean Iba, MD    Allergies    Donnatal [phenobarbital-belladonna alk], Fish allergy, Haldol [haloperidol lactate], Lidocaine, Maalox [calcium carbonate antacid], Shellfish allergy, Aspirin, Doxycycline, Keflex [cephalexin], Marinol [dronabinol], Other, Penicillins, and Bactrim [sulfamethoxazole-trimethoprim]  Review of Systems   Review of Systems  Constitutional:  Negative for chills and fever.  HENT:  Negative for congestion and sore throat.   Eyes: Negative.   Respiratory:  Negative for chest tightness and shortness of breath.   Cardiovascular:  Negative for chest pain.  Gastrointestinal:  Positive for abdominal pain, nausea  and vomiting. Negative for constipation and diarrhea.  Genitourinary: Negative.   Musculoskeletal:  Negative for arthralgias, joint swelling and neck pain.  Skin: Negative.  Negative for rash and wound.  Neurological:  Negative for dizziness, weakness, light-headedness, numbness and headaches.  Psychiatric/Behavioral: Negative.     Physical Exam Updated Vital Signs BP (!) 158/85   Pulse 80   Temp 98.6 F (37 C)   Resp 20   Ht 6' (1.829 m)   Wt 119.6 kg   SpO2 100%   BMI 35.76 kg/m   Physical Exam Vitals and nursing note reviewed.  Constitutional:      Appearance: He is well-developed.  HENT:     Head: Normocephalic and atraumatic.  Eyes:     Conjunctiva/sclera: Conjunctivae normal.  Cardiovascular:     Rate and Rhythm: Normal rate and regular rhythm.     Heart sounds: Normal heart sounds.  Pulmonary:     Effort: Pulmonary effort is normal.     Breath sounds: Normal breath sounds. No wheezing.  Abdominal:     General: Abdomen is protuberant. Bowel sounds are normal.     Palpations: Abdomen is soft.     Tenderness: There is abdominal tenderness in the epigastric area. There is no guarding or rebound.  Musculoskeletal:        General: Normal range of motion.     Cervical back: Normal range of motion.  Skin:    General: Skin is warm and dry.  Neurological:     Mental Status: He is alert.    ED Results / Procedures / Treatments   Labs (all labs ordered are listed, but only abnormal results are displayed) Labs Reviewed  COMPREHENSIVE METABOLIC PANEL - Abnormal; Notable for the following components:      Result Value   Glucose, Bld 108 (*)    BUN 55 (*)    Creatinine, Ser 5.72 (*)    GFR, Estimated 11 (*)    All other components within normal limits  CBC WITH DIFFERENTIAL/PLATELET - Abnormal; Notable for the following components:   RBC 3.12 (*)    Hemoglobin 11.3 (*)    HCT 32.8 (*)    MCV 105.1 (*)    MCH 36.2 (*)    Platelets 146 (*)    All other components  within normal limits  LIPASE, BLOOD  TROPONIN I (HIGH SENSITIVITY)  TROPONIN I (HIGH SENSITIVITY)    EKG None  Radiology CT ABDOMEN PELVIS WO CONTRAST  Result Date: 05/04/2021 CLINICAL DATA:  Epigastric pain vomiting EXAM: CT ABDOMEN AND PELVIS WITHOUT CONTRAST TECHNIQUE: Multidetector CT imaging of the abdomen and pelvis was performed following the standard protocol without IV contrast. COMPARISON:  CT 03/29/2021, 09/20/2020 FINDINGS: Lower chest: Lung bases demonstrate no acute consolidation or effusion. Normal cardiac size. Hepatobiliary: No focal hepatic abnormality. No biliary dilatation. No calcified stone Pancreas: Unremarkable. No pancreatic ductal dilatation or surrounding inflammatory changes. Spleen: Normal in size without focal abnormality. Adrenals/Urinary Tract: Adrenal glands are normal. Mild bilateral renal atrophy. No hydronephrosis. The bladder is unremarkable. Stomach/Bowel: Stomach is within normal limits. Appendix appears normal. No evidence of bowel wall thickening, distention, or inflammatory changes. Vascular/Lymphatic: Nonaneurysmal aorta. Mild atherosclerosis. No suspicious nodes Reproductive: Prostate is unremarkable. Other: Negative for free air or free fluid. Fat containing right inguinal hernia Musculoskeletal: Degenerative changes. No acute osseous abnormality. Chronic right-sided rib fractures. IMPRESSION: 1. No CT evidence for acute intra-abdominal or pelvic abnormality. Electronically Signed   By: Donavan Foil M.D.   On: 05/04/2021 16:19    Procedures Procedures   Medications Ordered in ED Medications  morphine 4 MG/ML injection 4 mg (4 mg Intramuscular Given 05/04/21 1522)  metoCLOPramide (REGLAN) injection 10 mg (10 mg Intramuscular  Given 05/04/21 1525)  HYDROmorphone (DILAUDID) injection 1 mg (1 mg Intramuscular Given 05/04/21 1622)    ED Course  I have reviewed the triage vital signs and the nursing notes.  Pertinent labs & imaging results that were  available during my care of the patient were reviewed by me and considered in my medical decision making (see chart for details).    MDM Rules/Calculators/A&P                           Labs and imaging reviewed and is reassuring.  CT is negative for small bowel obstruction, he has a nonaneurysmal aorta, no free air, no evidence for intra-abdominal infection.  Patient was given IM Reglan and had complete relief of his nausea, 4 mg of morphine did not relieve his pain.  After he was given 1 mg of Dilaudid his pain completely resolved.  He came in very hypertensive but was also in significant stress secondary to pain.  Once his pain was controlled his blood pressure improved.  Advised patient to take his Bentyl twice daily for the next week to see if this will prevent these episodes of pain.  He currently takes his medication as needed, usually after his pain has become severe and he finds it to not be helpful.  He was encouraged to follow-up with Dr. Gala Romney.  The patient appears reasonably screened and/or stabilized for discharge and I doubt any other medical condition or other [facility] requiring further screening, evaluation, or treatment in the ED at this time prior to discharge.  Final Clinical Impression(s) / ED Diagnoses Final diagnoses:  Epigastric pain  Nausea and vomiting, intractability of vomiting not specified, unspecified vomiting type    Rx / DC Orders ED Discharge Orders     None        Landis Martins 05/04/21 1831    Milton Ferguson, MD 05/06/21 1113

## 2021-05-06 DIAGNOSIS — Z992 Dependence on renal dialysis: Secondary | ICD-10-CM | POA: Diagnosis not present

## 2021-05-06 DIAGNOSIS — T82868A Thrombosis of vascular prosthetic devices, implants and grafts, initial encounter: Secondary | ICD-10-CM | POA: Diagnosis not present

## 2021-05-06 DIAGNOSIS — N186 End stage renal disease: Secondary | ICD-10-CM | POA: Diagnosis not present

## 2021-05-06 DIAGNOSIS — I871 Compression of vein: Secondary | ICD-10-CM | POA: Diagnosis not present

## 2021-05-07 DIAGNOSIS — N186 End stage renal disease: Secondary | ICD-10-CM | POA: Diagnosis not present

## 2021-05-07 DIAGNOSIS — Z992 Dependence on renal dialysis: Secondary | ICD-10-CM | POA: Diagnosis not present

## 2021-05-08 DIAGNOSIS — N186 End stage renal disease: Secondary | ICD-10-CM | POA: Diagnosis not present

## 2021-05-08 DIAGNOSIS — Z992 Dependence on renal dialysis: Secondary | ICD-10-CM | POA: Diagnosis not present

## 2021-05-09 ENCOUNTER — Encounter (INDEPENDENT_AMBULATORY_CARE_PROVIDER_SITE_OTHER): Payer: Medicare HMO | Admitting: Ophthalmology

## 2021-05-10 DIAGNOSIS — N186 End stage renal disease: Secondary | ICD-10-CM | POA: Diagnosis not present

## 2021-05-10 DIAGNOSIS — Z992 Dependence on renal dialysis: Secondary | ICD-10-CM | POA: Diagnosis not present

## 2021-05-12 ENCOUNTER — Encounter (HOSPITAL_COMMUNITY): Payer: Self-pay | Admitting: Emergency Medicine

## 2021-05-12 ENCOUNTER — Other Ambulatory Visit: Payer: Self-pay

## 2021-05-12 ENCOUNTER — Emergency Department (HOSPITAL_COMMUNITY)
Admission: EM | Admit: 2021-05-12 | Discharge: 2021-05-12 | Disposition: A | Discharge status: Home / Self Care | Payer: Medicare HMO | Attending: Emergency Medicine | Admitting: Emergency Medicine

## 2021-05-12 DIAGNOSIS — Z992 Dependence on renal dialysis: Secondary | ICD-10-CM | POA: Diagnosis not present

## 2021-05-12 DIAGNOSIS — I5032 Chronic diastolic (congestive) heart failure: Secondary | ICD-10-CM | POA: Insufficient documentation

## 2021-05-12 DIAGNOSIS — E1110 Type 2 diabetes mellitus with ketoacidosis without coma: Secondary | ICD-10-CM | POA: Insufficient documentation

## 2021-05-12 DIAGNOSIS — R1013 Epigastric pain: Secondary | ICD-10-CM

## 2021-05-12 DIAGNOSIS — E1122 Type 2 diabetes mellitus with diabetic chronic kidney disease: Secondary | ICD-10-CM | POA: Diagnosis not present

## 2021-05-12 DIAGNOSIS — I132 Hypertensive heart and chronic kidney disease with heart failure and with stage 5 chronic kidney disease, or end stage renal disease: Secondary | ICD-10-CM | POA: Insufficient documentation

## 2021-05-12 DIAGNOSIS — R112 Nausea with vomiting, unspecified: Secondary | ICD-10-CM | POA: Diagnosis not present

## 2021-05-12 DIAGNOSIS — N186 End stage renal disease: Secondary | ICD-10-CM | POA: Insufficient documentation

## 2021-05-12 DIAGNOSIS — K3184 Gastroparesis: Secondary | ICD-10-CM | POA: Diagnosis not present

## 2021-05-12 DIAGNOSIS — Z8616 Personal history of COVID-19: Secondary | ICD-10-CM | POA: Insufficient documentation

## 2021-05-12 DIAGNOSIS — Z79899 Other long term (current) drug therapy: Secondary | ICD-10-CM | POA: Diagnosis not present

## 2021-05-12 DIAGNOSIS — Z794 Long term (current) use of insulin: Secondary | ICD-10-CM | POA: Insufficient documentation

## 2021-05-12 LAB — CBC WITH DIFFERENTIAL/PLATELET
Abs Immature Granulocytes: 0.02 10*3/uL (ref 0.00–0.07)
Basophils Absolute: 0 10*3/uL (ref 0.0–0.1)
Basophils Relative: 0 %
Eosinophils Absolute: 0 10*3/uL (ref 0.0–0.5)
Eosinophils Relative: 1 %
HCT: 33.6 % — ABNORMAL LOW (ref 39.0–52.0)
Hemoglobin: 11.3 g/dL — ABNORMAL LOW (ref 13.0–17.0)
Immature Granulocytes: 0 %
Lymphocytes Relative: 21 %
Lymphs Abs: 1.1 10*3/uL (ref 0.7–4.0)
MCH: 35.6 pg — ABNORMAL HIGH (ref 26.0–34.0)
MCHC: 33.6 g/dL (ref 30.0–36.0)
MCV: 106 fL — ABNORMAL HIGH (ref 80.0–100.0)
Monocytes Absolute: 0.5 10*3/uL (ref 0.1–1.0)
Monocytes Relative: 9 %
Neutro Abs: 3.7 10*3/uL (ref 1.7–7.7)
Neutrophils Relative %: 69 %
Platelets: 129 10*3/uL — ABNORMAL LOW (ref 150–400)
RBC: 3.17 MIL/uL — ABNORMAL LOW (ref 4.22–5.81)
RDW: 12.9 % (ref 11.5–15.5)
WBC: 5.3 10*3/uL (ref 4.0–10.5)
nRBC: 0 % (ref 0.0–0.2)

## 2021-05-12 LAB — COMPREHENSIVE METABOLIC PANEL
ALT: 16 U/L (ref 0–44)
AST: 25 U/L (ref 15–41)
Albumin: 3.7 g/dL (ref 3.5–5.0)
Alkaline Phosphatase: 55 U/L (ref 38–126)
Anion gap: 12 (ref 5–15)
BUN: 43 mg/dL — ABNORMAL HIGH (ref 6–20)
CO2: 22 mmol/L (ref 22–32)
Calcium: 9 mg/dL (ref 8.9–10.3)
Chloride: 100 mmol/L (ref 98–111)
Creatinine, Ser: 5.84 mg/dL — ABNORMAL HIGH (ref 0.61–1.24)
GFR, Estimated: 11 mL/min — ABNORMAL LOW (ref >60)
Glucose, Bld: 154 mg/dL — ABNORMAL HIGH (ref 70–99)
Potassium: 3.7 mmol/L (ref 3.5–5.1)
Sodium: 134 mmol/L — ABNORMAL LOW (ref 135–145)
Total Bilirubin: 1 mg/dL (ref 0.3–1.2)
Total Protein: 7.4 g/dL (ref 6.5–8.1)

## 2021-05-12 LAB — LIPASE, BLOOD: Lipase: 36 U/L (ref 11–51)

## 2021-05-12 MED ORDER — HYDROMORPHONE HCL 1 MG/ML IJ SOLN
0.5000 mg | Freq: Once | INTRAMUSCULAR | Status: AC
  Administered 2021-05-12: 0.5 mg via INTRAMUSCULAR
  Filled 2021-05-12: qty 1

## 2021-05-12 MED ORDER — ALUM & MAG HYDROXIDE-SIMETH 200-200-20 MG/5ML PO SUSP
30.0000 mL | Freq: Once | ORAL | Status: AC
  Administered 2021-05-12: 30 mL via ORAL
  Filled 2021-05-12: qty 30

## 2021-05-12 MED ORDER — METOCLOPRAMIDE HCL 5 MG/ML IJ SOLN
10.0000 mg | Freq: Once | INTRAMUSCULAR | Status: AC
  Administered 2021-05-12: 10 mg via INTRAMUSCULAR
  Filled 2021-05-12: qty 2

## 2021-05-12 MED ORDER — FAMOTIDINE IN NACL 20-0.9 MG/50ML-% IV SOLN: 20.0000 mg | Freq: Once | INTRAVENOUS | Status: DC

## 2021-05-12 MED ORDER — HYDROMORPHONE HCL 1 MG/ML IJ SOLN: 0.5000 mg | Freq: Once | INTRAMUSCULAR | Status: DC

## 2021-05-12 MED ORDER — HYDROMORPHONE HCL 1 MG/ML IJ SOLN
1.0000 mg | Freq: Once | INTRAMUSCULAR | Status: AC
  Administered 2021-05-12: 1 mg via INTRAMUSCULAR
  Filled 2021-05-12: qty 1

## 2021-05-12 MED ORDER — METOCLOPRAMIDE HCL 5 MG/ML IJ SOLN: 10.0000 mg | Freq: Once | INTRAMUSCULAR | Status: DC

## 2021-05-12 MED ORDER — METOCLOPRAMIDE HCL 10 MG PO TABS: 10.0000 mg | ORAL_TABLET | Freq: Four times a day (QID) | ORAL | 0 refills | Status: AC

## 2021-05-12 MED ORDER — DICYCLOMINE HCL 20 MG PO TABS: 20.0000 mg | ORAL_TABLET | Freq: Two times a day (BID) | ORAL | 0 refills | Status: DC

## 2021-05-12 NOTE — Discharge Instructions (Signed)
Please use Reglan at home as soon as you start to feel nauseous or have epigastric pain.  I suspect gastroparesis is the cause of a lot of your symptoms.  You can use Bentyl as needed.  Make sure you are taking your home Protonix.  Please follow-up with your regular doctor regarding the symptoms.

## 2021-05-12 NOTE — ED Notes (Signed)
During triage pt stands up and begins to walk out of the room. Pt is redirected by his sister and security. Pt will not respond as to why he is trying to leave.

## 2021-05-12 NOTE — ED Triage Notes (Signed)
Pt reports abdominal pain that started yesterday. Pt reports he takes bentyl daily for this but it has not been helping.

## 2021-05-12 NOTE — ED Provider Notes (Signed)
[facility] EMERGENCY DEPARTMENT Provider Note   CSN: [id number] Arrival date & time: 05/12/21  3762     History Chief Complaint  Patient presents with   Abdominal Pain    Timothy Clark is a 50 y.o. male.  Timothy Clark is a 50 y.o. male  with a history of gastroparesis, GERD, H. Pylori, CKD on dialysis, and HTN presenting with epigastric pain for one day. The patient's sister is at bedside and assisted with history. He endorses nausea and vomiting associated with his pain. No hematemesis, but unable to keep anythign down today, dry heaving on arrival. Denies radiation to his back, or chest pain. He was seen in the ED for similar complaints last week and reports he had relief from that visit. He has tried home medications without relief. He reports the pain is so bad he is having trouble "controlling himself" due to pain. He has been seen for similar symptoms several times in the ED.  Per triage nurse patient intermittently becoming agitated, repeatedly walking out of the room during triage, accompanied by his sister.  On Friday he had 50% of his dialysis before stopping due to "being sick" and was last fully dialyzed on Wednesday (9/28).   The history is provided by the patient, a relative and medical records.      Past Medical History:  Diagnosis Date   Anemia    Anxiety    CHF (congestive heart failure) (HCC)    diastolic dysfunction   Chronic abdominal pain    COVID-19 07/2019   Depression    Esophagitis    ESRD on hemodialysis ([place])    undergoing evaluation for transplant   Eye hemorrhage    Gastritis    Gastroparesis    GERD (gastroesophageal reflux disease)    Glaucoma    Helicobacter pylori (H. pylori) infection 2016   ERADICATION DOCUMENTED VIA STOOL TEST in AUG 2016 at [facility]   Hiccups    Hypertension    Nausea and vomiting    chronic, recurrent   Neuropathy    feet   Renal insufficiency    Type 2 diabetes mellitus with complications ([place])     diagnosed around age 56   Vitreous hemorrhage of left eye ([place]) 12/15/2019    Patient Active Problem List   Diagnosis Date Noted   Seizures ([place]) 01/02/2021   Seizure ([place]) 01/02/2021   Problem with vascular access 09/17/2020   Bipolar disorder ([place]) 09/17/2020   Acute encephalopathy 07/24/2020   Intractable abdominal pain 07/23/2020   Encephalopathy    Colon cancer screening 07/12/2020   Optic atrophy, left eye 07/03/2020   Stable treated proliferative diabetic retinopathy of right eye determined by examination associated with type 2 diabetes mellitus ([employer]) 07/03/2020   Anxiety 03/13/2020   History of COVID-19 03/13/2020   History of gastroesophageal reflux (GERD) 83/15/1761   History of Helicobacter pylori infection 03/13/2020   Pre-transplant evaluation for kidney transplant 03/13/2020   Renovascular hypertension 03/13/2020   Hematemesis 03/02/2020   Coffee ground emesis 60/73/7106   Complication of vascular access for dialysis 01/29/2020   Follow-up examination after eye surgery 12/15/2019   Type 2 diabetes with nephropathy ([place]) 12/15/2019   Controlled diabetes mellitus with stable proliferative retinopathy of left eye, with long-term current use of insulin ([place]) 12/15/2019   Secondary glaucoma due to combination mechanisms, left, severe stage 12/15/2019   Nausea with vomiting 12/08/2019   Hyperglycemia due to type 2 diabetes mellitus ([place]) 10/28/2019   DKA (diabetic ketoacidoses) 10/27/2019  ESRD on dialysis ([place]) 06/26/2019   Constipation 03/15/2019   Chronic renal insufficiency, stage V ([place]) 02/21/2019   Type 2 diabetes mellitus with proliferative retinopathy ([place]) 10/25/2018   Acute renal failure superimposed on stage 4 chronic kidney disease ([place]) 10/25/2018   Extravasation injury of IV catheter site with other complication, initial encounter ([place])    Intractable vomiting    Chronic kidney disease    Poor venous access    Gastroparesis 10/24/2018   Closed displaced  fracture of lateral end of right clavicle 06/18/2018   HTN (hypertension), malignant 06/13/2018   Scrotal swelling 11/10/2017   Swelling 11/09/2017   Scrotal edema    Obesity, Class III, BMI 40-49.9 (morbid obesity) ([place]) 11/02/2017   CKD stage 3 due to type 2 diabetes mellitus ([place]) 10/30/2017   Wound eschars of foot, right  10/30/2017   Chronic abdominal pain 09/21/2017   Hypertensive urgency 09/21/2017   Prolonged QT interval 09/21/2017   GERD (gastroesophageal reflux disease) 07/14/2017   Glaucoma 07/14/2017   Anemia 07/14/2017   Pressure injury of skin 02/01/2017   Insulin dependent diabetes mellitus 09/07/2016   Orthostatic syncope 09/05/2016   Dumping syndrome 04/22/2016   Gastroparesis due to DM ([place]) 02/06/2015   Essential hypertension 12/06/2014   Diabetic macular edema ([place]) 02/18/2014   PDR (proliferative diabetic retinopathy) ([place]) 02/06/2014   Neovascular glaucoma of left eye 01/11/2014   Diabetic infection of left heel ([place]) 02/14/2010   UNSPECIFIED PERIPHERAL VASCULAR DISEASE 10/19/2009   ERECTILE DYSFUNCTION, ORGANIC 10/19/2009   NUMBNESS 07/20/2009   Diabetes mellitus type 2 with complications, uncontrolled 03/22/2009   Hyperlipidemia 03/22/2009   Depression 03/22/2009    Past Surgical History:  Procedure Laterality Date   AMPUTATION TOE Right    great toe   AV FISTULA INSERTION W/ RF MAGNETIC GUIDANCE Left 04/06/2019   Procedure: AV FISTULA INSERTION W/RF MAGNETIC GUIDANCE;  Surgeon: Katha Cabal, MD;  Location: [facility];  Service: Cardiovascular;  Laterality: Left;   AV FISTULA PLACEMENT Left 05/06/2019   Procedure: INSERTION OF ARTERIOVENOUS (AV) GORE-TEX GRAFT ARM ( FOREARM LOOP );  Surgeon: Katha Cabal, MD;  Location: [facility];  Service: Vascular;  Laterality: Left;   AV FISTULA PLACEMENT Left 03/21/2020   Procedure: INSERTION OF ARTERIOVENOUS (AV) GORE-TEX GRAFT ARM ( BRACHIAL AXILLARY );  Surgeon: Katha Cabal, MD;   Location: [facility];  Service: Vascular;  Laterality: Left;   CATARACT EXTRACTION W/PHACO Left 05/19/2016   Procedure: CATARACT EXTRACTION PHACO AND INTRAOCULAR LENS PLACEMENT LEFT EYE;  Surgeon: Tonny Branch, MD;  Location: [facility];  Service: Ophthalmology;  Laterality: Left;  CDE: 7.30   CATARACT EXTRACTION W/PHACO Right 06/23/2016   Procedure: CATARACT EXTRACTION PHACO AND INTRAOCULAR LENS PLACEMENT RIGHT EYE CDE=9.87;  Surgeon: Tonny Branch, MD;  Location: [facility];  Service: Ophthalmology;  Laterality: Right;  right   DIALYSIS/PERMA CATHETER REMOVAL N/A 07/19/2019   Procedure: DIALYSIS/PERMA CATHETER REMOVAL;  Surgeon: Katha Cabal, MD;  Location: [facility];  Service: Cardiovascular;  Laterality: N/A;   ESOPHAGOGASTRODUODENOSCOPY  2015   Dr. Britta Mccreedy   ESOPHAGOGASTRODUODENOSCOPY N/A 10/26/2014   RMR: Distal esophagititis-likely reflux related although an element of pill induced injuruy no exclueded  status post biopsy. Diffusely abnormal gastric mucosa of uncertain signigicane -status post gastric biopsy. Focal area of excoriation in the cardia most consistant with a trauma of heaving.    ESOPHAGOGASTRODUODENOSCOPY  08/2015   [facility]: mild esophagitis and old gastric contents   EYE SURGERY Left 2015  stent placed to left eye   EYE SURGERY Right 06/2019   per patient- to remove scar tissue   INCISION AND DRAINAGE OF WOUND Right 07/16/2017   Procedure: IRRIGATION AND DEBRIDEMENT OF SOFT TISSUE OF ULCERATION RIGHT FOOT;  Surgeon: Caprice Beaver, DPM;  Location: [facility];  Service: Podiatry;  Laterality: Right;   IR THROMBECTOMY AV FISTULA W/THROMBOLYSIS/PTA INC/SHUNT/IMG LEFT Left 09/19/2020   IR US GUIDE VASC ACCESS LEFT  09/19/2020   PERIPHERAL VASCULAR THROMBECTOMY Left 09/27/2019   Procedure: PERIPHERAL VASCULAR THROMBECTOMY;  Surgeon: Katha Cabal, MD;  Location: [facility];  Service: Cardiovascular;  Laterality: Left;   PERIPHERAL VASCULAR THROMBECTOMY Left 10/27/2019    Procedure: PERIPHERAL VASCULAR THROMBECTOMY;  Surgeon: Algernon Huxley, MD;  Location: [facility];  Service: Cardiovascular;  Laterality: Left;   PERIPHERAL VASCULAR THROMBECTOMY Left 12/13/2019   Procedure: PERIPHERAL VASCULAR THROMBECTOMY;  Surgeon: Katha Cabal, MD;  Location: [facility];  Service: Cardiovascular;  Laterality: Left;       Family History  Problem Relation Age of Onset   Ovarian cancer Mother    Heart attack Father    Colon polyps Father    Cervical cancer Sister    Diabetes Sister    Colon cancer Neg Hx     Social History   Tobacco Use   Smoking status: Never   Smokeless tobacco: Never  Vaping Use   Vaping Use: Never used  Substance Use Topics   Alcohol use: No    Alcohol/week: 0.0 standard drinks   Drug use: No    Home Medications Prior to Admission medications   Medication Sig Start Date End Date Taking? Authorizing Provider  dicyclomine (BENTYL) 20 MG tablet Take 1 tablet (20 mg total) by mouth 2 (two) times daily. 05/12/21  Yes Jacqlyn Larsen, PA-C  metoCLOPramide (REGLAN) 10 MG tablet Take 1 tablet (10 mg total) by mouth every 6 (six) hours. 05/12/21  Yes Jacqlyn Larsen, PA-C  ALPRAZolam Duanne Moron) 0.5 MG tablet Take 0.5 mg by mouth 2 (two) times daily as needed for anxiety.    [provider]  amLODipine (NORVASC) 5 MG tablet Take 5 mg by mouth daily.    [provider]  atorvastatin (LIPITOR) 10 MG tablet Take 1 tablet (10 mg total) by mouth at bedtime. 10/13/20 04/05/21  Murlean Iba, MD  atorvastatin (LIPITOR) 10 MG tablet atorvastatin 10 mg tablet Patient not taking: Reported on 04/05/2021    [provider]  brimonidine (ALPHAGAN) 0.15 % ophthalmic solution Place 1 drop into both eyes in the morning and at bedtime.     [provider]  brimonidine (ALPHAGAN) 0.2 % ophthalmic solution 1 drop 2 (two) times daily. Patient not taking: Reported on 04/05/2021 02/06/21   [provider]   divalproex (DEPAKOTE) 250 MG DR tablet Take 750 mg by mouth 3 (three) times daily.    [provider]  divalproex (DEPAKOTE) 500 MG DR tablet  08/30/20   [provider]  dorzolamide-timolol (COSOPT) 22.3-6.8 MG/ML ophthalmic solution Place 1 drop into both eyes 2 (two) times daily.  01/16/20   [provider]  famotidine (PEPCID) 20 MG tablet Take 20 mg by mouth 2 (two) times daily.    [provider]  folic acid (FOLVITE) 1 MG tablet Take 1 mg by mouth daily.    [provider]  gentamicin cream (GARAMYCIN) 0.1 % Apply 1 application topically 2 (two) times daily. Patient not taking: No sig reported  12/12/20   Edrick Kins, DPM  Glucagon (GVOKE HYPOPEN 2-PACK) 1 MG/0.2ML SOAJ Inject 1 Syringe into the skin as needed (hypoglycemia).    [provider]  Iron-FA-B Cmp-C-Biot-Probiotic (FUSION PLUS PO) Take 1 capsule by mouth daily.    [provider]  LEADER UNIFINE PENTIPS PLUS 32G X 4 MM MISC  05/17/20   [provider]  linezolid (ZYVOX) 600 MG tablet linezolid 600 mg tablet  TAKE 1 TABLET BY MOUTH EVERY 12 HOURS Patient not taking: No sig reported    [provider]  NOVOLOG MIX 70/30 FLEXPEN (70-30) 100 UNIT/ML FlexPen Inject 35-45 Units into the skin See admin instructions. Inject 45 units in the morning and 35 every evening 02/06/21   [provider]  ondansetron (ZOFRAN-ODT) 4 MG disintegrating tablet Take 4 mg by mouth every 8 (eight) hours as needed for nausea or vomiting.    [provider]  oxyCODONE-acetaminophen (PERCOCET) 5-325 MG tablet Take 1 tablet by mouth every 6 (six) hours as needed. 03/23/2021   Milton Ferguson, MD  pantoprazole (PROTONIX) 40 MG tablet Take 1 tablet (40 mg total) by mouth 2 (two) times daily before a meal. 08/24/18   Annitta Needs, NP  polyethylene glycol (MIRALAX / GLYCOLAX) 17 g packet Take 17 g by mouth daily as needed for mild constipation. Patient not taking: No sig  reported 10/13/20   Wynetta Emery, [place] L, MD  senna-docusate (SENOKOT-S) 8.6-50 MG tablet Take 1 tablet by mouth 2 (two) times daily. Patient not taking: Reported on 04/05/2021 10/13/20   Irwin Brakeman L, MD  sitaGLIPtin (JANUVIA) 25 MG tablet Take 25 mg by mouth daily.     [provider]  sucralfate (CARAFATE) 1 g tablet Take 1 g by mouth 4 (four) times daily.    [provider]  tiZANidine (ZANAFLEX) 2 MG tablet Take 2 mg by mouth See admin instructions. Take 1 tablet by mouth every 6 to 8 hours for pain/muscle spams Patient not taking: No sig reported    [provider]  torsemide (DEMADEX) 20 MG tablet Take 40 mg by mouth daily. At noon Patient not taking: Reported on 04/05/2021    [provider]  valproic acid (DEPAKENE) 250 MG capsule Take 750 mg by mouth 3 (three) times daily. Patient not taking: Reported on 04/05/2021    [provider]  ziprasidone (GEODON) 20 MG capsule Take 1 tab with breakfast and 2 tabs at HS Patient taking differently: Take 20-40 mg by mouth See admin instructions. Take 20 mg capsule with breakfast and 40 mg at bedtime 10/13/20   Murlean Iba, MD    Allergies    Donnatal [phenobarbital-belladonna alk], Fish allergy, Haldol [haloperidol lactate], Lidocaine, Maalox [calcium carbonate antacid], Shellfish allergy, Aspirin, Doxycycline, Keflex [cephalexin], Marinol [dronabinol], Other, Penicillins, and Bactrim [sulfamethoxazole-trimethoprim]  Review of Systems   Review of Systems  Constitutional:  Negative for chills and fever.  HENT: Negative.    Respiratory:  Negative for cough and shortness of breath.   Cardiovascular:  Negative for chest pain.  Gastrointestinal:  Positive for abdominal pain, nausea and vomiting. Negative for diarrhea.  Genitourinary:  Negative for dysuria and frequency.  Musculoskeletal:  Negative for myalgias.  Skin:  Negative for color change and rash.  Neurological:  Negative for  light-headedness and headaches.  All other systems reviewed and are negative.  Physical Exam Updated Vital Signs BP (!) 163/89 (BP Location: Right Arm)   Pulse 89   Temp 98 F (36.7 C) (Oral)  Resp 14   SpO2 100%   Physical Exam Vitals and nursing note reviewed.  Constitutional:      General: He is not in acute distress.    Appearance: Normal appearance. He is well-developed. He is not diaphoretic.     Comments: Alert, dry heaving and complaining of pain, no acute distress  HENT:     Head: Normocephalic and atraumatic.     Mouth/Throat:     Mouth: Mucous membranes are moist.     Pharynx: Oropharynx is clear.  Eyes:     General:        Right eye: No discharge.        Left eye: No discharge.     Pupils: Pupils are equal, round, and reactive to light.  Cardiovascular:     Rate and Rhythm: Normal rate and regular rhythm.     Pulses: Normal pulses.     Heart sounds: Normal heart sounds.  Pulmonary:     Effort: Pulmonary effort is normal. No respiratory distress.     Breath sounds: Normal breath sounds. No wheezing or rales.     Comments: Respirations equal and unlabored, patient able to speak in full sentences, lungs clear to auscultation bilaterally  Abdominal:     General: Bowel sounds are normal. There is no distension.     Palpations: Abdomen is soft. There is no mass.     Tenderness: There is abdominal tenderness in the epigastric area. There is no guarding.     Comments: Abdomen soft, nondistended, bowel sounds present throughout, focal epigastric tenderness without guarding or rebound tenderness, all other quadrants nontender.  Musculoskeletal:        General: No deformity.     Cervical back: Neck supple.  Skin:    General: Skin is warm and dry.     Capillary Refill: Capillary refill takes less than 2 seconds.  Neurological:     Mental Status: He is alert and oriented to person, place, and time.     Coordination: Coordination normal.     Comments: Speech is clear,  able to follow commands Moves extremities without ataxia, coordination intact  Psychiatric:        Mood and Affect: Mood normal.        Behavior: Behavior normal.    ED Results / Procedures / Treatments   Labs (all labs ordered are listed, but only abnormal results are displayed) Labs Reviewed  COMPREHENSIVE METABOLIC PANEL - Abnormal; Notable for the following components:      Result Value   Sodium 134 (*)    Glucose, Bld 154 (*)    BUN 43 (*)    Creatinine, Ser 5.84 (*)    GFR, Estimated 11 (*)    All other components within normal limits  CBC WITH DIFFERENTIAL/PLATELET - Abnormal; Notable for the following components:   RBC 3.17 (*)    Hemoglobin 11.3 (*)    HCT 33.6 (*)    MCV 106.0 (*)    MCH 35.6 (*)    Platelets 129 (*)    All other components within normal limits  LIPASE, BLOOD    EKG None  Radiology No results found.  Procedures Procedures   Medications Ordered in ED Medications  metoCLOPramide (REGLAN) injection 10 mg (10 mg Intramuscular Given 05/12/21 1127)  HYDROmorphone (DILAUDID) injection 1 mg (1 mg Intramuscular Given 05/12/21 1127)  alum & mag hydroxide-simeth (MAALOX/MYLANTA) 200-200-20 MG/5ML suspension 30 mL (30 mLs Oral Given 05/12/21 1326)  HYDROmorphone (DILAUDID) injection 0.5 mg (0.5 mg  Intramuscular Given 05/12/21 1326)    ED Course  I have reviewed the triage vital signs and the nursing notes.  Pertinent labs & imaging results that were available during my care of the patient were reviewed by me and considered in my medical decision making (see chart for details).    MDM Rules/Calculators/A&P                           Patient presents to the ED with complaints of abdominal pain. Patient nontoxic appearing, crying out in pain and intermittently becoming agitated, dry heaving, vitals WNL. On exam patient tender to palpation in the epigastric region, no peritoneal signs. Will evaluate with labs, has been seen numerous times for similar  symptoms, do not feel that imaging is indicated.  High suspicion this is due to exacerbation of gastroparesis.  Analgesics, anti-emetics, and fluids administered.   Additional history obtained:  Additional history obtained from chart review & nursing note review.   Lab Tests:  I Ordered, reviewed, and interpreted labs, which included:  CBC: No leukocytosis, stable hemoglobin CMP: No significant electrolyte derangements, despite only receiving half of dialysis treatment on Friday potassium of 3.7, creatinine of 5.84 with BUN of 43, normal LFTs Lipase: WNL   ED Course:    RE-EVAL: After pain and nausea medication patient is feeling much better, now tolerating p.o. fluids.  On repeat abdominal exam patient remains without peritoneal signs, low suspicion for cholecystitis, pancreatitis, diverticulitis, appendicitis, bowel obstruction/perforation, or other acute surgical process. Patient tolerating PO in the emergency department. Patient denying suspect much of this is coming from his gastroparesis.  Prescribed Bentyl and Reglan to help manage symptoms at home.  Stressed the importance of follow-up.  Will discharge home with supportive measures. I discussed results, treatment plan, need for PCP follow-up, and return precautions with the patient. Provided opportunity for questions, patient confirmed understanding and is in agreement with plan.    Portions of this note were generated with [employer]. Dictation errors may occur despite best attempts at proofreading.     Final Clinical Impression(s) / ED Diagnoses Final diagnoses:  Epigastric pain  Nausea and vomiting, unspecified vomiting type  Gastroparesis    Rx / DC Orders ED Discharge Orders          Ordered    metoCLOPramide (REGLAN) 10 MG tablet  Every 6 hours        05/12/21 1411    dicyclomine (BENTYL) 20 MG tablet  2 times daily        05/12/21 1411             Jacqlyn Larsen, [place] 05/13/21 1200     Truddie Hidden, MD 05/13/21 1258

## 2021-05-13 DIAGNOSIS — Z992 Dependence on renal dialysis: Secondary | ICD-10-CM | POA: Diagnosis not present

## 2021-05-13 DIAGNOSIS — N186 End stage renal disease: Secondary | ICD-10-CM | POA: Diagnosis not present

## 2021-05-15 DIAGNOSIS — E118 Type 2 diabetes mellitus with unspecified complications: Secondary | ICD-10-CM | POA: Diagnosis not present

## 2021-05-15 DIAGNOSIS — M86179 Other acute osteomyelitis, unspecified ankle and foot: Secondary | ICD-10-CM | POA: Diagnosis not present

## 2021-05-15 DIAGNOSIS — N186 End stage renal disease: Secondary | ICD-10-CM | POA: Diagnosis not present

## 2021-05-15 DIAGNOSIS — Z992 Dependence on renal dialysis: Secondary | ICD-10-CM | POA: Diagnosis not present

## 2021-05-15 DIAGNOSIS — E1122 Type 2 diabetes mellitus with diabetic chronic kidney disease: Secondary | ICD-10-CM | POA: Diagnosis not present

## 2021-05-15 DIAGNOSIS — N183 Chronic kidney disease, stage 3 unspecified: Secondary | ICD-10-CM | POA: Diagnosis not present

## 2021-05-17 DIAGNOSIS — Z992 Dependence on renal dialysis: Secondary | ICD-10-CM | POA: Diagnosis not present

## 2021-05-17 DIAGNOSIS — N186 End stage renal disease: Secondary | ICD-10-CM | POA: Diagnosis not present

## 2021-05-20 DIAGNOSIS — E119 Type 2 diabetes mellitus without complications: Secondary | ICD-10-CM | POA: Diagnosis not present

## 2021-05-20 DIAGNOSIS — Z992 Dependence on renal dialysis: Secondary | ICD-10-CM | POA: Diagnosis not present

## 2021-05-20 DIAGNOSIS — Z794 Long term (current) use of insulin: Secondary | ICD-10-CM | POA: Diagnosis not present

## 2021-05-20 DIAGNOSIS — N186 End stage renal disease: Secondary | ICD-10-CM | POA: Diagnosis not present

## 2021-05-22 DIAGNOSIS — N186 End stage renal disease: Secondary | ICD-10-CM | POA: Diagnosis not present

## 2021-05-22 DIAGNOSIS — Z992 Dependence on renal dialysis: Secondary | ICD-10-CM | POA: Diagnosis not present

## 2021-05-27 ENCOUNTER — Other Ambulatory Visit: Payer: Self-pay

## 2021-05-27 ENCOUNTER — Emergency Department (HOSPITAL_COMMUNITY): Payer: Medicare HMO

## 2021-05-27 ENCOUNTER — Observation Stay (HOSPITAL_COMMUNITY)
Admission: EM | Admit: 2021-05-27 | Discharge: 2021-05-28 | Disposition: A | Discharge status: Home / Self Care | Payer: Medicare HMO | Attending: Internal Medicine | Admitting: Internal Medicine

## 2021-05-27 DIAGNOSIS — Z992 Dependence on renal dialysis: Secondary | ICD-10-CM | POA: Diagnosis not present

## 2021-05-27 DIAGNOSIS — Z89411 Acquired absence of right great toe: Secondary | ICD-10-CM | POA: Insufficient documentation

## 2021-05-27 DIAGNOSIS — Z95828 Presence of other vascular implants and grafts: Secondary | ICD-10-CM | POA: Diagnosis not present

## 2021-05-27 DIAGNOSIS — Z888 Allergy status to other drugs, medicaments and biological substances status: Secondary | ICD-10-CM | POA: Insufficient documentation

## 2021-05-27 DIAGNOSIS — Z8616 Personal history of COVID-19: Secondary | ICD-10-CM | POA: Diagnosis not present

## 2021-05-27 DIAGNOSIS — Z833 Family history of diabetes mellitus: Secondary | ICD-10-CM | POA: Insufficient documentation

## 2021-05-27 DIAGNOSIS — T82868A Thrombosis of vascular prosthetic devices, implants and grafts, initial encounter: Principal | ICD-10-CM

## 2021-05-27 DIAGNOSIS — Z79899 Other long term (current) drug therapy: Secondary | ICD-10-CM | POA: Insufficient documentation

## 2021-05-27 DIAGNOSIS — K219 Gastro-esophageal reflux disease without esophagitis: Secondary | ICD-10-CM | POA: Diagnosis not present

## 2021-05-27 DIAGNOSIS — E1143 Type 2 diabetes mellitus with diabetic autonomic (poly)neuropathy: Secondary | ICD-10-CM | POA: Insufficient documentation

## 2021-05-27 DIAGNOSIS — Z880 Allergy status to penicillin: Secondary | ICD-10-CM | POA: Insufficient documentation

## 2021-05-27 DIAGNOSIS — Z7984 Long term (current) use of oral hypoglycemic drugs: Secondary | ICD-10-CM | POA: Insufficient documentation

## 2021-05-27 DIAGNOSIS — Y832 Surgical operation with anastomosis, bypass or graft as the cause of abnormal reaction of the patient, or of later complication, without mention of misadventure at the time of the procedure: Secondary | ICD-10-CM | POA: Insufficient documentation

## 2021-05-27 DIAGNOSIS — K2100 Gastro-esophageal reflux disease with esophagitis, without bleeding: Secondary | ICD-10-CM | POA: Insufficient documentation

## 2021-05-27 DIAGNOSIS — T82590A Other mechanical complication of surgically created arteriovenous fistula, initial encounter: Secondary | ICD-10-CM | POA: Diagnosis not present

## 2021-05-27 DIAGNOSIS — N186 End stage renal disease: Secondary | ICD-10-CM | POA: Diagnosis not present

## 2021-05-27 DIAGNOSIS — I509 Heart failure, unspecified: Secondary | ICD-10-CM | POA: Diagnosis not present

## 2021-05-27 DIAGNOSIS — E1122 Type 2 diabetes mellitus with diabetic chronic kidney disease: Secondary | ICD-10-CM | POA: Diagnosis not present

## 2021-05-27 DIAGNOSIS — R0602 Shortness of breath: Secondary | ICD-10-CM | POA: Diagnosis not present

## 2021-05-27 DIAGNOSIS — I5030 Unspecified diastolic (congestive) heart failure: Secondary | ICD-10-CM | POA: Diagnosis not present

## 2021-05-27 DIAGNOSIS — I10 Essential (primary) hypertension: Secondary | ICD-10-CM | POA: Diagnosis present

## 2021-05-27 DIAGNOSIS — I132 Hypertensive heart and chronic kidney disease with heart failure and with stage 5 chronic kidney disease, or end stage renal disease: Secondary | ICD-10-CM | POA: Insufficient documentation

## 2021-05-27 DIAGNOSIS — Z20822 Contact with and (suspected) exposure to covid-19: Secondary | ICD-10-CM | POA: Diagnosis not present

## 2021-05-27 DIAGNOSIS — Z886 Allergy status to analgesic agent status: Secondary | ICD-10-CM | POA: Diagnosis not present

## 2021-05-27 DIAGNOSIS — Z881 Allergy status to other antibiotic agents status: Secondary | ICD-10-CM | POA: Insufficient documentation

## 2021-05-27 DIAGNOSIS — D631 Anemia in chronic kidney disease: Secondary | ICD-10-CM | POA: Diagnosis not present

## 2021-05-27 DIAGNOSIS — Z8249 Family history of ischemic heart disease and other diseases of the circulatory system: Secondary | ICD-10-CM | POA: Insufficient documentation

## 2021-05-27 DIAGNOSIS — T829XXA Unspecified complication of cardiac and vascular prosthetic device, implant and graft, initial encounter: Secondary | ICD-10-CM

## 2021-05-27 DIAGNOSIS — Z794 Long term (current) use of insulin: Secondary | ICD-10-CM | POA: Diagnosis not present

## 2021-05-27 DIAGNOSIS — E1129 Type 2 diabetes mellitus with other diabetic kidney complication: Secondary | ICD-10-CM | POA: Diagnosis not present

## 2021-05-27 LAB — CBC WITH DIFFERENTIAL/PLATELET
Abs Immature Granulocytes: 0.03 10*3/uL (ref 0.00–0.07)
Basophils Absolute: 0 10*3/uL (ref 0.0–0.1)
Basophils Relative: 0 %
Eosinophils Absolute: 0.1 10*3/uL (ref 0.0–0.5)
Eosinophils Relative: 2 %
HCT: 33.3 % — ABNORMAL LOW (ref 39.0–52.0)
Hemoglobin: 11.4 g/dL — ABNORMAL LOW (ref 13.0–17.0)
Immature Granulocytes: 1 %
Lymphocytes Relative: 18 %
Lymphs Abs: 0.9 10*3/uL (ref 0.7–4.0)
MCH: 35.6 pg — ABNORMAL HIGH (ref 26.0–34.0)
MCHC: 34.2 g/dL (ref 30.0–36.0)
MCV: 104.1 fL — ABNORMAL HIGH (ref 80.0–100.0)
Monocytes Absolute: 0.4 10*3/uL (ref 0.1–1.0)
Monocytes Relative: 8 %
Neutro Abs: 3.8 10*3/uL (ref 1.7–7.7)
Neutrophils Relative %: 71 %
Platelets: 147 10*3/uL — ABNORMAL LOW (ref 150–400)
RBC: 3.2 MIL/uL — ABNORMAL LOW (ref 4.22–5.81)
RDW: 12.6 % (ref 11.5–15.5)
WBC: 5.3 10*3/uL (ref 4.0–10.5)
nRBC: 0 % (ref 0.0–0.2)

## 2021-05-27 LAB — COMPREHENSIVE METABOLIC PANEL
ALT: 11 U/L (ref 0–44)
AST: 17 U/L (ref 15–41)
Albumin: 3.2 g/dL — ABNORMAL LOW (ref 3.5–5.0)
Alkaline Phosphatase: 48 U/L (ref 38–126)
Anion gap: 13 (ref 5–15)
BUN: 64 mg/dL — ABNORMAL HIGH (ref 6–20)
CO2: 23 mmol/L (ref 22–32)
Calcium: 9 mg/dL (ref 8.9–10.3)
Chloride: 103 mmol/L (ref 98–111)
Creatinine, Ser: 6.8 mg/dL — ABNORMAL HIGH (ref 0.61–1.24)
GFR, Estimated: 9 mL/min — ABNORMAL LOW (ref >60)
Glucose, Bld: 95 mg/dL (ref 70–99)
Potassium: 4.3 mmol/L (ref 3.5–5.1)
Sodium: 139 mmol/L (ref 135–145)
Total Bilirubin: 0.6 mg/dL (ref 0.3–1.2)
Total Protein: 6.7 g/dL (ref 6.5–8.1)

## 2021-05-27 LAB — CBG MONITORING, ED
Glucose-Capillary: 100 mg/dL — ABNORMAL HIGH (ref 70–99)
Glucose-Capillary: 112 mg/dL — ABNORMAL HIGH (ref 70–99)

## 2021-05-27 LAB — RESP PANEL BY RT-PCR (FLU A&B, COVID) ARPGX2
Influenza A by PCR: NEGATIVE
Influenza B by PCR: NEGATIVE
SARS Coronavirus 2 by RT PCR: NEGATIVE

## 2021-05-27 MED ORDER — METOCLOPRAMIDE HCL 5 MG PO TABS
10.0000 mg | ORAL_TABLET | Freq: Four times a day (QID) | ORAL | Status: DC
  Administered 2021-05-27 – 2021-05-28 (×3): 10 mg via ORAL
  Filled 2021-05-27: qty 1
  Filled 2021-05-27 (×2): qty 2
  Filled 2021-05-27 (×2): qty 1

## 2021-05-27 MED ORDER — INSULIN ASPART 100 UNIT/ML IJ SOLN
0.0000 [IU] | Freq: Three times a day (TID) | INTRAMUSCULAR | Status: DC
  Administered 2021-05-28: 1 [IU] via SUBCUTANEOUS

## 2021-05-27 MED ORDER — ACETAMINOPHEN 650 MG RE SUPP: 650.0000 mg | Freq: Four times a day (QID) | RECTAL | Status: DC | PRN

## 2021-05-27 MED ORDER — ATORVASTATIN CALCIUM 10 MG PO TABS
10.0000 mg | ORAL_TABLET | Freq: Every day | ORAL | Status: DC
  Administered 2021-05-27: 10 mg via ORAL
  Filled 2021-05-27: qty 1

## 2021-05-27 MED ORDER — INSULIN ASPART PROT & ASPART (70-30 MIX) 100 UNIT/ML ~~LOC~~ SUSP: 25.0000 [IU] | Freq: Every day | SUBCUTANEOUS | Status: DC

## 2021-05-27 MED ORDER — BRIMONIDINE TARTRATE 0.15 % OP SOLN
1.0000 [drp] | Freq: Two times a day (BID) | OPHTHALMIC | Status: DC
  Administered 2021-05-28: 1 [drp] via OPHTHALMIC
  Filled 2021-05-27: qty 5

## 2021-05-27 MED ORDER — FAMOTIDINE 20 MG PO TABS
20.0000 mg | ORAL_TABLET | Freq: Two times a day (BID) | ORAL | Status: DC
  Administered 2021-05-27 – 2021-05-28 (×2): 20 mg via ORAL
  Filled 2021-05-27: qty 1

## 2021-05-27 MED ORDER — PANTOPRAZOLE SODIUM 40 MG PO TBEC
40.0000 mg | DELAYED_RELEASE_TABLET | Freq: Two times a day (BID) | ORAL | Status: DC
  Administered 2021-05-28 (×2): 40 mg via ORAL
  Filled 2021-05-27 (×2): qty 1

## 2021-05-27 MED ORDER — AMLODIPINE BESYLATE 5 MG PO TABS
5.0000 mg | ORAL_TABLET | Freq: Every day | ORAL | Status: DC
  Administered 2021-05-28: 5 mg via ORAL
  Filled 2021-05-27: qty 1

## 2021-05-27 MED ORDER — ZIPRASIDONE HCL 40 MG PO CAPS
40.0000 mg | ORAL_CAPSULE | Freq: Every day | ORAL | Status: DC
  Administered 2021-05-28: 40 mg via ORAL
  Filled 2021-05-27 (×3): qty 1

## 2021-05-27 MED ORDER — DIVALPROEX SODIUM 500 MG PO DR TAB
750.0000 mg | DELAYED_RELEASE_TABLET | Freq: Three times a day (TID) | ORAL | Status: DC
  Administered 2021-05-27 – 2021-05-28 (×2): 750 mg via ORAL
  Filled 2021-05-27: qty 3
  Filled 2021-05-27 (×3): qty 1

## 2021-05-27 MED ORDER — SUCRALFATE 1 G PO TABS
1.0000 g | ORAL_TABLET | Freq: Four times a day (QID) | ORAL | Status: DC
  Administered 2021-05-27 – 2021-05-28 (×3): 1 g via ORAL
  Filled 2021-05-27 (×5): qty 1

## 2021-05-27 MED ORDER — DIPHENHYDRAMINE HCL 25 MG PO CAPS
25.0000 mg | ORAL_CAPSULE | Freq: Once | ORAL | Status: AC
  Administered 2021-05-28: 25 mg via ORAL
  Filled 2021-05-27: qty 1

## 2021-05-27 MED ORDER — ZIPRASIDONE HCL 20 MG PO CAPS
20.0000 mg | ORAL_CAPSULE | Freq: Every day | ORAL | Status: DC
  Administered 2021-05-28: 20 mg via ORAL
  Filled 2021-05-27: qty 1

## 2021-05-27 MED ORDER — ACETAMINOPHEN 325 MG PO TABS
650.0000 mg | ORAL_TABLET | Freq: Four times a day (QID) | ORAL | Status: DC | PRN
  Administered 2021-05-27: 650 mg via ORAL
  Filled 2021-05-27: qty 2

## 2021-05-27 MED ORDER — INSULIN ASPART PROT & ASPART (70-30 MIX) 100 UNIT/ML ~~LOC~~ SUSP
30.0000 [IU] | Freq: Every day | SUBCUTANEOUS | Status: DC
  Filled 2021-05-27: qty 10

## 2021-05-27 MED ORDER — DORZOLAMIDE HCL-TIMOLOL MAL 2-0.5 % OP SOLN
1.0000 [drp] | Freq: Two times a day (BID) | OPHTHALMIC | Status: DC
  Administered 2021-05-28: 1 [drp] via OPHTHALMIC
  Filled 2021-05-27: qty 10

## 2021-05-27 MED ORDER — METOCLOPRAMIDE HCL 5 MG/ML IJ SOLN
10.0000 mg | Freq: Once | INTRAMUSCULAR | Status: DC
Start: 2021-05-27 — End: 2021-05-29

## 2021-05-27 MED ORDER — INSULIN ASPART 100 UNIT/ML IJ SOLN: 0.0000 [IU] | Freq: Every day | INTRAMUSCULAR | Status: DC

## 2021-05-27 NOTE — ED Triage Notes (Signed)
Pt. Stated, my dialysis port is clogged.Last dialysis was last WEdnesday.

## 2021-05-27 NOTE — Subjective & Objective (Signed)
CC: AV graft clotted HPI: 50 yo AAM with hx of type 2 DM, end-stage renal disease on hemodialysis Monday Wednesday Friday, hypertension, reflux who presents to the ER today from vascular surgery office.  Patient went to dialysis last on Wednesday.  He dialyzes in [place].  He states that on Friday when he went to dialysis, they were unable to run his dialysis due to clots in his AV graft.  Patient went to vascular surgery on Monday.  Patient was unable to have his graft declotted.  Patient did have some coughing while he was lying on the table.  He was sent to the ER for evaluation.  Interventional radiology was contacted.  And patient set up for AV graft declot tomorrow morning.  [facility] hospitalist consulted for admission.  Patient states he is feeling fine otherwise.  No fever, chills, diarrhea.  His breathing is normal.  Denies any shortness of breath.  He states he is hungry.  He is eating all day.  Patient is legally blind.

## 2021-05-27 NOTE — Progress Notes (Signed)
Patient with a history of ESRD s/p  left brachial axillary gore tex graft placed on 8.11.22. Previous left forearm loop graft and a previous left AVF. Patient presented to the ED at [facility] with malfunctioning AVG.  Patient last had dialysis on Wednesday. On Friday he returned to dialysis and reports that they were unable to access the graft and were pulling clots. IR previously performed a thrombectomy on 2.9.22 with a 8 mm balloon Per the ED PA said there is no thrill or bruit. No imaging. BUN 64, Cr 6.8. Please see allergies list for permanent allergies.   IR consulted for possible declot. Case has been reviewed and procedure approved by Dr. [name].  Patient tentatively scheduled for 10.18.22 at 9am.  Should patient be admitted Team instructed to: Keep Patient to be NPO after midnight Hold prophylactic anticoagulation 24 hours prior to scheduled procedure. IR will call patient when ready.  If Patient is hemodynamically able to be discharged he should: Present to short stay @ 0700 on 10.18.22  Bring a driver  NPO at midnight.

## 2021-05-27 NOTE — H&P (Signed)
History and Physical    Timothy Clark LPF:[id number] DOB: [birth year] DOA: 05/27/2021  PCP: [name], MD   Patient coming from: Home  I have personally briefly reviewed patient's old medical records in [place]  CC: AV graft clotted HPI: 50 yo AAM with hx of type 2 DM, end-stage renal disease on hemodialysis Monday Wednesday Friday, hypertension, reflux who presents to the ER today from vascular surgery office.  Patient went to dialysis last on Wednesday.  He dialyzes in [place].  He states that on Friday when he went to dialysis, they were unable to run his dialysis due to clots in his AV graft.  Patient went to vascular surgery on Monday.  Patient was unable to have his graft declotted.  Patient did have some coughing while he was lying on the table.  He was sent to the ER for evaluation.  Interventional radiology was contacted.  And patient set up for AV graft declot tomorrow morning.  [facility] hospitalist consulted for admission.  Patient states he is feeling fine otherwise.  No fever, chills, diarrhea.  His breathing is normal.  Denies any shortness of breath.  He states he is hungry.  He is eating all day.  Patient is legally blind.   ED Course: IR contacted. Pt scheduled for declot tomorrow morning  Review of Systems:  Review of Systems  Constitutional: Negative.   HENT: Negative.    Eyes:        Pt is legally blind  Respiratory:  Positive for cough. Negative for shortness of breath and wheezing.   Cardiovascular: Negative.        Left UE AVG clotted since Friday.  Gastrointestinal: Negative.   Genitourinary: Negative.   Musculoskeletal: Negative.   Skin: Negative.   Neurological: Negative.   Endo/Heme/Allergies: Negative.   Psychiatric/Behavioral: Negative.    All other systems reviewed and are negative.  Past Medical History:  Diagnosis Date   Anemia    Anxiety    CHF (congestive heart failure) (HCC)    diastolic dysfunction   Chronic abdominal pain     COVID-19 07/2019   Depression    Esophagitis    ESRD on hemodialysis (HCC)    undergoing evaluation for transplant   Eye hemorrhage    Gastritis    Gastroparesis    GERD (gastroesophageal reflux disease)    Glaucoma    Helicobacter pylori (H. pylori) infection 2016   ERADICATION DOCUMENTED VIA STOOL TEST in AUG 2016 at [facility]   Hiccups    Hypertension    Nausea and vomiting    chronic, recurrent   Neuropathy    feet   Renal insufficiency    Type 2 diabetes mellitus with complications ([place])    diagnosed around age 11   Vitreous hemorrhage of left eye ([place]) 12/15/2019    Past Surgical History:  Procedure Laterality Date   AMPUTATION TOE Right    great toe   AV FISTULA INSERTION W/ RF MAGNETIC GUIDANCE Left 04/06/2019   Procedure: AV FISTULA INSERTION W/RF MAGNETIC GUIDANCE;  Surgeon: Katha Cabal, MD;  Location: [facility];  Service: Cardiovascular;  Laterality: Left;   AV FISTULA PLACEMENT Left 05/06/2019   Procedure: INSERTION OF ARTERIOVENOUS (AV) GORE-TEX GRAFT ARM ( FOREARM LOOP );  Surgeon: Katha Cabal, MD;  Location: [facility];  Service: Vascular;  Laterality: Left;   AV FISTULA PLACEMENT Left 03/21/2020   Procedure: INSERTION OF ARTERIOVENOUS (AV) GORE-TEX GRAFT ARM ( BRACHIAL AXILLARY );  Surgeon:  Schnier, Dolores Lory, MD;  Location: [facility];  Service: Vascular;  Laterality: Left;   CATARACT EXTRACTION W/PHACO Left 05/19/2016   Procedure: CATARACT EXTRACTION PHACO AND INTRAOCULAR LENS PLACEMENT LEFT EYE;  Surgeon: Tonny Branch, MD;  Location: [facility];  Service: Ophthalmology;  Laterality: Left;  CDE: 7.30   CATARACT EXTRACTION W/PHACO Right 06/23/2016   Procedure: CATARACT EXTRACTION PHACO AND INTRAOCULAR LENS PLACEMENT RIGHT EYE CDE=9.87;  Surgeon: Tonny Branch, MD;  Location: [facility];  Service: Ophthalmology;  Laterality: Right;  right   DIALYSIS/PERMA CATHETER REMOVAL N/A 07/19/2019   Procedure: DIALYSIS/PERMA CATHETER REMOVAL;  Surgeon: Katha Cabal, MD;  Location: [facility];  Service: Cardiovascular;  Laterality: N/A;   ESOPHAGOGASTRODUODENOSCOPY  2015   Dr. Britta Mccreedy   ESOPHAGOGASTRODUODENOSCOPY N/A 10/26/2014   RMR: Distal esophagititis-likely reflux related although an element of pill induced injuruy no exclueded  status post biopsy. Diffusely abnormal gastric mucosa of uncertain signigicane -status post gastric biopsy. Focal area of excoriation in the cardia most consistant with a trauma of heaving.    ESOPHAGOGASTRODUODENOSCOPY  08/2015   [facility]: mild esophagitis and old gastric contents   EYE SURGERY Left 2015   stent placed to left eye   EYE SURGERY Right 06/2019   per patient- to remove scar tissue   INCISION AND DRAINAGE OF WOUND Right 07/16/2017   Procedure: IRRIGATION AND DEBRIDEMENT OF SOFT TISSUE OF ULCERATION RIGHT FOOT;  Surgeon: Caprice Beaver, DPM;  Location: [facility];  Service: Podiatry;  Laterality: Right;   IR THROMBECTOMY AV FISTULA W/THROMBOLYSIS/PTA INC/SHUNT/IMG LEFT Left 09/19/2020   IR US GUIDE VASC ACCESS LEFT  09/19/2020   PERIPHERAL VASCULAR THROMBECTOMY Left 09/27/2019   Procedure: PERIPHERAL VASCULAR THROMBECTOMY;  Surgeon: Katha Cabal, MD;  Location: [facility];  Service: Cardiovascular;  Laterality: Left;   PERIPHERAL VASCULAR THROMBECTOMY Left 10/27/2019   Procedure: PERIPHERAL VASCULAR THROMBECTOMY;  Surgeon: Algernon Huxley, MD;  Location: [facility];  Service: Cardiovascular;  Laterality: Left;   PERIPHERAL VASCULAR THROMBECTOMY Left 12/13/2019   Procedure: PERIPHERAL VASCULAR THROMBECTOMY;  Surgeon: Katha Cabal, MD;  Location: [facility];  Service: Cardiovascular;  Laterality: Left;     reports that he has never smoked. He has never used smokeless tobacco. He reports that he does not drink alcohol and does not use drugs.  Allergies  Allergen Reactions   Donnatal [Phenobarbital-Belladonna Alk] Anaphylaxis and Other (See Comments)    Reaction to GI  cocktail   Fish Allergy Anaphylaxis, Hives and Rash   Haldol [Haloperidol Lactate] Other (See Comments)    Chest Pain   Lidocaine Anaphylaxis and Other (See Comments)    Reaction to GI cocktail   Maalox [Calcium Carbonate Antacid] Anaphylaxis and Other (See Comments)    Reaction to GI cocktail   Shellfish Allergy Hives    Per patient: Patient stated he breaks out in hives when eating shellfish   Aspirin Hives and Itching   Doxycycline Hives, Nausea And Vomiting and Other (See Comments)    Severe    Keflex [Cephalexin] Hives, Itching and Nausea And Vomiting   Marinol [Dronabinol] Nausea And Vomiting and Other (See Comments)    Caused worsening vomiting.    Other Swelling   Penicillins Hives, Itching, Other (See Comments) and Rash    Has patient had a PCN reaction causing immediate rash, facial/tongue/throat swelling, SOB or lightheadedness with hypotension: yes Has patient had a PCN reaction causing severe rash involving mucus membranes or skin necrosis: yes Has patient  had a PCN reaction that required hospitalization no Has patient had a PCN reaction occurring within the last 10 years: yes If all of the above answers are "NO", then may proceed with Cephalospor   Bactrim [Sulfamethoxazole-Trimethoprim] Itching    Family History  Problem Relation Age of Onset   Ovarian cancer Mother    Heart attack Father    Colon polyps Father    Cervical cancer Sister    Diabetes Sister    Colon cancer Neg Hx     Prior to Admission medications   Medication Sig Start Date End Date Taking? Authorizing Provider  ALPRAZolam Duanne Moron) 0.5 MG tablet Take 0.5 mg by mouth 2 (two) times daily as needed for anxiety.   Yes [provider]  amLODipine (NORVASC) 5 MG tablet Take 5 mg by mouth daily.   Yes [provider]  atorvastatin (LIPITOR) 10 MG tablet Take 1 tablet (10 mg total) by mouth at bedtime. 10/13/20 05/27/21 Yes Johnson, Clanford L, MD  brimonidine (ALPHAGAN) 0.15 % ophthalmic  solution Place 1 drop into both eyes in the morning and at bedtime.    Yes [provider]  dicyclomine (BENTYL) 20 MG tablet Take 1 tablet (20 mg total) by mouth 2 (two) times daily. Patient taking differently: Take 20 mg by mouth 2 (two) times daily as needed for spasms. 05/12/21  Yes Jacqlyn Larsen, PA-C  divalproex (DEPAKOTE) 250 MG DR tablet Take 750 mg by mouth 3 (three) times daily.   Yes [provider]  dorzolamide-timolol (COSOPT) 22.3-6.8 MG/ML ophthalmic solution Place 1 drop into both eyes 2 (two) times daily.  01/16/20  Yes [provider]  famotidine (PEPCID) 20 MG tablet Take 20 mg by mouth 2 (two) times daily.   Yes [provider]  folic acid (FOLVITE) 1 MG tablet Take 1 mg by mouth daily.   Yes [provider]  gentamicin cream (GARAMYCIN) 0.1 % Apply 1 application topically 2 (two) times daily. 12/12/20  Yes Edrick Kins, DPM  Glucagon (GVOKE HYPOPEN 2-PACK) 1 MG/0.2ML SOAJ Inject 1 Syringe into the skin once as needed (hypoglycemia).   Yes [provider]  Iron-FA-B Cmp-C-Biot-Probiotic (FUSION PLUS PO) Take 1 capsule by mouth daily.   Yes [provider]  metoCLOPramide (REGLAN) 10 MG tablet Take 1 tablet (10 mg total) by mouth every 6 (six) hours. 05/12/21  Yes Ford, Kelsey N, PA-C  NOVOLOG MIX 70/30 FLEXPEN (70-30) 100 UNIT/ML FlexPen Inject 25-35 Units into the skin See admin instructions. Inject 35 units in the morning and 25 every evening - Sliding scale 02/06/21  Yes [provider]  ondansetron (ZOFRAN-ODT) 4 MG disintegrating tablet Take 4 mg by mouth every 8 (eight) hours as needed for nausea or vomiting.   Yes [provider]  pantoprazole (PROTONIX) 40 MG tablet Take 1 tablet (40 mg total) by mouth 2 (two) times daily before a meal. 08/24/18  Yes Annitta Needs, NP  polyethylene glycol (MIRALAX / GLYCOLAX) 17 g packet Take 17 g by mouth daily as needed for mild constipation. Patient taking  differently: Take 17 g by mouth daily as needed for moderate constipation. 10/13/20  Yes Johnson, Clanford L, MD  sitaGLIPtin (JANUVIA) 25 MG tablet Take 25 mg by mouth daily.    Yes [provider]  sucralfate (CARAFATE) 1 g tablet Take 1 g by mouth 4 (four) times daily.   Yes [provider]  torsemide (DEMADEX) 20 MG tablet Take 40 mg by mouth daily. At  noon   Yes [provider]  ziprasidone (GEODON) 20 MG capsule Take 1 tab with breakfast and 2 tabs at HS Patient taking differently: Take 20-40 mg by mouth See admin instructions. Take 20 mg capsule with breakfast and 40 mg at bedtime 10/13/20  Yes Johnson, Clanford L, MD  LEADER UNIFINE PENTIPS PLUS 32G X 4 MM MISC  05/17/20   [provider]  oxyCODONE-acetaminophen (PERCOCET) 5-325 MG tablet Take 1 tablet by mouth every 6 (six) hours as needed. Patient not taking: Reported on 05/27/2021 03/20/2021   Milton Ferguson, MD  senna-docusate (SENOKOT-S) 8.6-50 MG tablet Take 1 tablet by mouth 2 (two) times daily. Patient not taking: No sig reported 10/13/20   Murlean Iba, MD    Physical Exam: Vitals:   05/27/21 0928 05/27/21 1235 05/27/21 2059 05/27/21 2136  BP: (!) 170/96 (!) 165/95 (!) 173/92 (!) 186/94  Pulse: 86 77 73 80  Resp: _0 Temp: 98.3 F (36.8 C)  98.3 F (36.8 C)   TempSrc: Oral     SpO2: 100% 100% 100% 100%    Physical Exam Vitals and nursing note reviewed.  Constitutional:      General: He is not in acute distress.    Appearance: He is obese. He is not ill-appearing, toxic-appearing or diaphoretic.  HENT:     Head: Normocephalic and atraumatic.     Nose: Nose normal.  Cardiovascular:     Rate and Rhythm: Normal rate and regular rhythm.     Pulses: Normal pulses.  Pulmonary:     Effort: Pulmonary effort is normal. No respiratory distress.     Breath sounds: No wheezing or rales.  Abdominal:     General: Bowel sounds are normal. There is no distension.     Tenderness:  There is no abdominal tenderness. There is no guarding or rebound.  Musculoskeletal:     Comments: No thrill or bruit in left UE AVG  Skin:    General: Skin is warm and dry.     Capillary Refill: Capillary refill takes less than 2 seconds.  Neurological:     Mental Status: He is alert and oriented to person, place, and time.     Labs on Admission: I have personally reviewed following labs and imaging studies  CBC: Recent Labs  Lab 05/27/21 0943  WBC 5.3  NEUTROABS 3.8  HGB 11.4*  HCT 33.3*  MCV 104.1*  PLT 263*   Basic Metabolic Panel: Recent Labs  Lab 05/27/21 0943  NA 139  K 4.3  CL 103  CO2 23  GLUCOSE 95  BUN 64*  CREATININE 6.80*  CALCIUM 9.0   GFR: CrCl cannot be calculated (Unknown ideal weight.). Liver Function Tests: Recent Labs  Lab 05/27/21 0943  AST 17  ALT 11  ALKPHOS 48  BILITOT 0.6  PROT 6.7  ALBUMIN 3.2*   No results for input(s): LIPASE, AMYLASE in the last 168 hours. No results for input(s): AMMONIA in the last 168 hours. Coagulation Profile: No results for input(s): INR, PROTIME in the last 168 hours. Cardiac Enzymes: No results for input(s): CKTOTAL, CKMB, CKMBINDEX, TROPONINI in the last 168 hours. BNP (last 3 results) No results for input(s): PROBNP in the last 8760 hours. HbA1C: No results for input(s): HGBA1C in the last 72 hours. CBG: Recent Labs  Lab 05/27/21 1514  GLUCAP 100*   Lipid Profile: No results for input(s): CHOL, HDL, LDLCALC, TRIG, CHOLHDL, LDLDIRECT in the last 72 hours. Thyroid Function Tests: No  results for input(s): TSH, T4TOTAL, FREET4, T3FREE, THYROIDAB in the last 72 hours. Anemia Panel: No results for input(s): VITAMINB12, FOLATE, FERRITIN, TIBC, IRON, RETICCTPCT in the last 72 hours. Urine analysis:    Component Value Date/Time   COLORURINE YELLOW 10/24/2018 1424   APPEARANCEUR CLEAR 10/24/2018 1424   LABSPEC 1.014 10/24/2018 1424   PHURINE 5.0 10/24/2018 1424   GLUCOSEU >=500 (A) 10/24/2018  1424   HGBUR MODERATE (A) 10/24/2018 1424   BILIRUBINUR NEGATIVE 10/24/2018 1424   KETONESUR NEGATIVE 10/24/2018 1424   PROTEINUR >=300 (A) 10/24/2018 1424   UROBILINOGEN 0.2 06/07/2015 1353   NITRITE NEGATIVE 10/24/2018 1424   LEUKOCYTESUR NEGATIVE 10/24/2018 1424    Radiological Exams on Admission: I have personally reviewed images DG Chest 2 View  Result Date: 05/27/2021 CLINICAL DATA:  Shortness of breath. EXAM: CHEST - 2 VIEW COMPARISON:  01/02/2021 FINDINGS: Both lungs are clear. Heart and mediastinum are within normal limits. Trachea is midline. No pleural effusions. Negative for a pneumothorax. IMPRESSION: No active cardiopulmonary disease. Electronically Signed   By: Markus Daft M.D.   On: 05/27/2021 10:31    EKG: I have personally reviewed EKG: NSR   Assessment/Plan Principal Problem:   Clotted renal dialysis AV graft (HCC) Active Problems:   Type 2 diabetes mellitus with ESRD (end-stage renal disease) ([place])   ESRD on dialysis ([place])   Essential hypertension   GERD (gastroesophageal reflux disease)    Clotted renal dialysis AV graft ([place]) Observation overnight. IR has been contacted and pt will get declot tomorrow morning. NPO after MN.  Type 2 diabetes mellitus with ESRD (end-stage renal disease) ([place]) Add SSI.  ESRD on dialysis [facility]) Has not had HD since last Wednesday. May need inpatient HD session prior to DC.  Essential hypertension Stable.  GERD (gastroesophageal reflux disease) Stable.  DVT prophylaxis: SCDs Code Status: Full Code Family Communication: no family at bedside  Disposition Plan: return home  Consults called: IR called by EDP  Admission status: Observation, Med-Surg   Kristopher Oppenheim, DO [facility] 05/27/2021, 10:45 PM

## 2021-05-27 NOTE — ED Provider Notes (Signed)
I evaluated patient briefly in triage and discussed with Dr. Caryl Comes of vascular surgery who recommended discussion with interventional radiology.  I discussed with Anderson Malta of interventional radiology nurse practitioner who will make attending aware of case and will evaluate patient.  IR eval order placed.    Tedd Sias, [place] 05/27/21 1416    Valarie Merino, MD 05/28/21 [phone]

## 2021-05-27 NOTE — Assessment & Plan Note (Addendum)
-   declotted in IR on 10/18

## 2021-05-27 NOTE — Assessment & Plan Note (Addendum)
Has not had HD since last Wednesday.  -completed HD in hospital on 10/18 prior to d/c

## 2021-05-27 NOTE — Assessment & Plan Note (Signed)
Stable

## 2021-05-27 NOTE — Assessment & Plan Note (Signed)
Add SSI. ?

## 2021-05-27 NOTE — ED Provider Notes (Signed)
Emergency Medicine Provider Triage Evaluation Note  Timothy Clark , a 50 y.o. male  was evaluated in triage.  Pt complains of dialysis since last Wednesday, clog in his AV fistula in his upper left arm.  Patient reports that the clot was found on last Friday, he was unable to complete dialysis, they attempted to unclog it however patient was having some congestion, coughing while he was on the table, and so they sent him to the emergency department for further evaluation.  Review of Systems  Positive: SOB, clog Negative: Chest pain, nausea, vomiting, dysuria  Physical Exam  BP (!) 170/96 (BP Location: Right Arm)   Pulse 86   Temp 98.3 F (36.8 C) (Oral)   Resp 16   SpO2 100%  Gen:   Awake, no distress  Resp:  Normal effort  MSK:   Moves extremities without difficulty  Other:  LUE fistula without palpable thrill, no tenderness to palpation of surrounding tissue  Medical Decision Making  Medically screening exam initiated at 9:39 AM.  Appropriate orders placed.  KYROLLOS ROODE was informed that the remainder of the evaluation will be completed by another provider, this initial triage assessment does not replace that evaluation, and the importance of remaining in the ED until their evaluation is complete.  Vascular consult placed by [facility], PA-C   Keldon Lassen, [place] [facility], PA-C 05/27/21 [id number]    Daleen Bo, MD 05/27/21 2036

## 2021-05-27 NOTE — ED Provider Notes (Signed)
[facility] EMERGENCY DEPARTMENT Provider Note   CSN: [id number] Arrival date & time: 05/27/21  0263     History Chief Complaint  Patient presents with   Vascular Access Problem    Timothy Clark is a 50 y.o. male who presents to the emergency department for evaluation of a dialysis fistula problem.  Patient is an ESRD patient and receives dialysis Monday Wednesday Friday and has not received a full dialysis session since 05/22/2021.  On Friday, 05/24/2021, the patient noticed that he had a clotted fistula and was scheduled for an outpatient declotting procedure today, but had an episode of coughing and vomiting while on the table and outpatient providers uncomfortable with taking care of the patient in the outpatient setting thus sending the patient to the emergency department.  He currently denies chest pain, shortness of breath, abdominal pain or nausea, vomiting or any systemic symptoms.  HPI     Past Medical History:  Diagnosis Date   Anemia    Anxiety    CHF (congestive heart failure) (HCC)    diastolic dysfunction   Chronic abdominal pain    COVID-19 07/2019   Depression    Esophagitis    ESRD on hemodialysis ([place])    undergoing evaluation for transplant   Eye hemorrhage    Gastritis    Gastroparesis    GERD (gastroesophageal reflux disease)    Glaucoma    Helicobacter pylori (H. pylori) infection 2016   ERADICATION DOCUMENTED VIA STOOL TEST in AUG 2016 at [facility]   Hiccups    Hypertension    Nausea and vomiting    chronic, recurrent   Neuropathy    feet   Renal insufficiency    Type 2 diabetes mellitus with complications ([place])    diagnosed around age 42   Vitreous hemorrhage of left eye ([place]) 12/15/2019    Patient Active Problem List   Diagnosis Date Noted   Clotted renal dialysis AV graft ([place]) 05/27/2021   Seizures ([place]) 01/02/2021   Seizure ([place]) 01/02/2021   Problem with vascular access 09/17/2020   Bipolar disorder ([place])  09/17/2020   Acute encephalopathy 07/24/2020   Intractable abdominal pain 07/23/2020   Encephalopathy    Colon cancer screening 07/12/2020   Optic atrophy, left eye 07/03/2020   Stable treated proliferative diabetic retinopathy of right eye determined by examination associated with type 2 diabetes mellitus ([place]) 07/03/2020   Anxiety 03/13/2020   History of COVID-19 03/13/2020   History of gastroesophageal reflux (GERD) 78/58/8502   History of Helicobacter pylori infection 03/13/2020   Pre-transplant evaluation for kidney transplant 03/13/2020   Renovascular hypertension 03/13/2020   Hematemesis 03/02/2020   Coffee ground emesis 77/41/2878   Complication of vascular access for dialysis 01/29/2020   Follow-up examination after eye surgery 12/15/2019   Type 2 diabetes with nephropathy ([place]) 12/15/2019   Controlled diabetes mellitus with stable proliferative retinopathy of left eye, with long-term current use of insulin ([place]) 12/15/2019   Secondary glaucoma due to combination mechanisms, left, severe stage 12/15/2019   Nausea with vomiting 12/08/2019   Hyperglycemia due to type 2 diabetes mellitus ([place]) 10/28/2019   DKA (diabetic ketoacidoses) 10/27/2019   ESRD on dialysis ([place]) 06/26/2019   Constipation 03/15/2019   Type 2 diabetes mellitus with proliferative retinopathy ([place]) 10/25/2018   Extravasation injury of IV catheter site with other complication, initial encounter ([place])    Intractable vomiting    Chronic kidney disease    Poor venous access    Gastroparesis 10/24/2018  Closed displaced fracture of lateral end of right clavicle 06/18/2018   HTN (hypertension), malignant 06/13/2018   Scrotal swelling 11/10/2017   Swelling 11/09/2017   Scrotal edema    Obesity, Class III, BMI 40-49.9 (morbid obesity) ([place]) 11/02/2017   Wound eschars of foot, right  10/30/2017   Chronic abdominal pain 09/21/2017   Hypertensive urgency 09/21/2017   Prolonged QT interval 09/21/2017   GERD  (gastroesophageal reflux disease) 07/14/2017   Glaucoma 07/14/2017   Anemia 07/14/2017   Pressure injury of skin 02/01/2017   Insulin dependent diabetes mellitus 09/07/2016   Orthostatic syncope 09/05/2016   Dumping syndrome 04/22/2016   Gastroparesis due to DM ([place]) 02/06/2015   Essential hypertension 12/06/2014   Diabetic macular edema ([place]) 02/18/2014   PDR (proliferative diabetic retinopathy) ([employer]) 02/06/2014   Neovascular glaucoma of left eye 01/11/2014   Diabetic infection of left heel ([place]) 02/14/2010   UNSPECIFIED PERIPHERAL VASCULAR DISEASE 10/19/2009   ERECTILE DYSFUNCTION, ORGANIC 10/19/2009   NUMBNESS 07/20/2009   Type 2 diabetes mellitus with ESRD (end-stage renal disease) ([place]) 03/22/2009   Hyperlipidemia 03/22/2009   Depression 03/22/2009    Past Surgical History:  Procedure Laterality Date   AMPUTATION TOE Right    great toe   AV FISTULA INSERTION W/ RF MAGNETIC GUIDANCE Left 04/06/2019   Procedure: AV FISTULA INSERTION W/RF MAGNETIC GUIDANCE;  Surgeon: Katha Cabal, MD;  Location: [facility];  Service: Cardiovascular;  Laterality: Left;   AV FISTULA PLACEMENT Left 05/06/2019   Procedure: INSERTION OF ARTERIOVENOUS (AV) GORE-TEX GRAFT ARM ( FOREARM LOOP );  Surgeon: Katha Cabal, MD;  Location: [facility];  Service: Vascular;  Laterality: Left;   AV FISTULA PLACEMENT Left 03/21/2020   Procedure: INSERTION OF ARTERIOVENOUS (AV) GORE-TEX GRAFT ARM ( BRACHIAL AXILLARY );  Surgeon: Katha Cabal, MD;  Location: [facility];  Service: Vascular;  Laterality: Left;   CATARACT EXTRACTION W/PHACO Left 05/19/2016   Procedure: CATARACT EXTRACTION PHACO AND INTRAOCULAR LENS PLACEMENT LEFT EYE;  Surgeon: Tonny Branch, MD;  Location: [facility];  Service: Ophthalmology;  Laterality: Left;  CDE: 7.30   CATARACT EXTRACTION W/PHACO Right 06/23/2016   Procedure: CATARACT EXTRACTION PHACO AND INTRAOCULAR LENS PLACEMENT RIGHT EYE CDE=9.87;  Surgeon: Tonny Branch, MD;   Location: [facility];  Service: Ophthalmology;  Laterality: Right;  right   DIALYSIS/PERMA CATHETER REMOVAL N/A 07/19/2019   Procedure: DIALYSIS/PERMA CATHETER REMOVAL;  Surgeon: Katha Cabal, MD;  Location: [facility];  Service: Cardiovascular;  Laterality: N/A;   ESOPHAGOGASTRODUODENOSCOPY  2015   Dr. Britta Mccreedy   ESOPHAGOGASTRODUODENOSCOPY N/A 10/26/2014   RMR: Distal esophagititis-likely reflux related although an element of pill induced injuruy no exclueded  status post biopsy. Diffusely abnormal gastric mucosa of uncertain signigicane -status post gastric biopsy. Focal area of excoriation in the cardia most consistant with a trauma of heaving.    ESOPHAGOGASTRODUODENOSCOPY  08/2015   [facility]: mild esophagitis and old gastric contents   EYE SURGERY Left 2015   stent placed to left eye   EYE SURGERY Right 06/2019   per patient- to remove scar tissue   INCISION AND DRAINAGE OF WOUND Right 07/16/2017   Procedure: IRRIGATION AND DEBRIDEMENT OF SOFT TISSUE OF ULCERATION RIGHT FOOT;  Surgeon: Caprice Beaver, DPM;  Location: [facility];  Service: Podiatry;  Laterality: Right;   IR THROMBECTOMY AV FISTULA W/THROMBOLYSIS/PTA INC/SHUNT/IMG LEFT Left 09/19/2020   IR US GUIDE VASC ACCESS LEFT  09/19/2020   PERIPHERAL VASCULAR THROMBECTOMY Left 09/27/2019   Procedure: PERIPHERAL VASCULAR THROMBECTOMY;  Surgeon: Katha Cabal, MD;  Location: [facility];  Service: Cardiovascular;  Laterality: Left;   PERIPHERAL VASCULAR THROMBECTOMY Left 10/27/2019   Procedure: PERIPHERAL VASCULAR THROMBECTOMY;  Surgeon: Algernon Huxley, MD;  Location: [facility];  Service: Cardiovascular;  Laterality: Left;   PERIPHERAL VASCULAR THROMBECTOMY Left 12/13/2019   Procedure: PERIPHERAL VASCULAR THROMBECTOMY;  Surgeon: Katha Cabal, MD;  Location: [facility];  Service: Cardiovascular;  Laterality: Left;       Family History  Problem Relation Age of Onset   Ovarian cancer Mother     Heart attack Father    Colon polyps Father    Cervical cancer Sister    Diabetes Sister    Colon cancer Neg Hx     Social History   Tobacco Use   Smoking status: Never   Smokeless tobacco: Never  Vaping Use   Vaping Use: Never used  Substance Use Topics   Alcohol use: No    Alcohol/week: 0.0 standard drinks   Drug use: No    Home Medications Prior to Admission medications   Medication Sig Start Date End Date Taking? Authorizing Provider  ALPRAZolam Duanne Moron) 0.5 MG tablet Take 0.5 mg by mouth 2 (two) times daily as needed for anxiety.   Yes [provider]  amLODipine (NORVASC) 5 MG tablet Take 5 mg by mouth daily.   Yes [provider]  atorvastatin (LIPITOR) 10 MG tablet Take 1 tablet (10 mg total) by mouth at bedtime. 10/13/20 05/27/21 Yes Johnson, Clanford L, MD  brimonidine (ALPHAGAN) 0.15 % ophthalmic solution Place 1 drop into both eyes in the morning and at bedtime.    Yes [provider]  dicyclomine (BENTYL) 20 MG tablet Take 1 tablet (20 mg total) by mouth 2 (two) times daily. Patient taking differently: Take 20 mg by mouth 2 (two) times daily as needed for spasms. 05/12/21  Yes Jacqlyn Larsen, PA-C  divalproex (DEPAKOTE) 250 MG DR tablet Take 750 mg by mouth 3 (three) times daily.   Yes [provider]  dorzolamide-timolol (COSOPT) 22.3-6.8 MG/ML ophthalmic solution Place 1 drop into both eyes 2 (two) times daily.  01/16/20  Yes [provider]  famotidine (PEPCID) 20 MG tablet Take 20 mg by mouth 2 (two) times daily.   Yes [provider]  folic acid (FOLVITE) 1 MG tablet Take 1 mg by mouth daily.   Yes [provider]  gentamicin cream (GARAMYCIN) 0.1 % Apply 1 application topically 2 (two) times daily. 12/12/20  Yes Edrick Kins, DPM  Glucagon (GVOKE HYPOPEN 2-PACK) 1 MG/0.2ML SOAJ Inject 1 Syringe into the skin once as needed (hypoglycemia).   Yes [provider]  Iron-FA-B Cmp-C-Biot-Probiotic (FUSION  PLUS PO) Take 1 capsule by mouth daily.   Yes [provider]  metoCLOPramide (REGLAN) 10 MG tablet Take 1 tablet (10 mg total) by mouth every 6 (six) hours. 05/12/21  Yes Ford, Kelsey N, PA-C  NOVOLOG MIX 70/30 FLEXPEN (70-30) 100 UNIT/ML FlexPen Inject 25-35 Units into the skin See admin instructions. Inject 35 units in the morning and 25 every evening - Sliding scale 02/06/21  Yes [provider]  ondansetron (ZOFRAN-ODT) 4 MG disintegrating tablet Take 4 mg by mouth every 8 (eight) hours as needed for nausea or vomiting.   Yes [provider]  pantoprazole (PROTONIX) 40 MG tablet Take 1 tablet (40 mg total) by mouth 2 (two) times daily before a meal. 08/24/18  Yes Annitta Needs, NP  polyethylene glycol (MIRALAX / GLYCOLAX) 17 g packet Take 17 g by mouth daily as needed for mild constipation. Patient taking differently: Take 17 g by mouth daily as needed for moderate constipation. 10/13/20  Yes Johnson, Clanford L, MD  sitaGLIPtin (JANUVIA) 25 MG tablet Take 25 mg by mouth daily.    Yes [provider]  sucralfate (CARAFATE) 1 g tablet Take 1 g by mouth 4 (four) times daily.   Yes [provider]  torsemide (DEMADEX) 20 MG tablet Take 40 mg by mouth daily. At noon   Yes [provider]  ziprasidone (GEODON) 20 MG capsule Take 1 tab with breakfast and 2 tabs at HS Patient taking differently: Take 20-40 mg by mouth See admin instructions. Take 20 mg capsule with breakfast and 40 mg at bedtime 10/13/20  Yes Johnson, Clanford L, MD  LEADER UNIFINE PENTIPS PLUS 32G X 4 MM MISC  05/17/20   [provider]  oxyCODONE-acetaminophen (PERCOCET) 5-325 MG tablet Take 1 tablet by mouth every 6 (six) hours as needed. Patient not taking: Reported on 05/27/2021 04/06/2021   Milton Ferguson, MD  senna-docusate (SENOKOT-S) 8.6-50 MG tablet Take 1 tablet by mouth 2 (two) times daily. Patient not taking: No sig reported 10/13/20   Murlean Iba, MD     Allergies    Donnatal [phenobarbital-belladonna alk], Fish allergy, Haldol [haloperidol lactate], Lidocaine, Maalox [calcium carbonate antacid], Shellfish allergy, Aspirin, Doxycycline, Keflex [cephalexin], Marinol [dronabinol], Other, Penicillins, and Bactrim [sulfamethoxazole-trimethoprim]  Review of Systems   Review of Systems  Constitutional:  Negative for chills and fever.  HENT:  Negative for ear pain and sore throat.   Eyes:  Negative for pain and visual disturbance.  Respiratory:  Negative for cough and shortness of breath.   Cardiovascular:  Negative for chest pain and palpitations.  Gastrointestinal:  Negative for abdominal pain and vomiting.  Genitourinary:  Negative for dysuria and hematuria.  Musculoskeletal:  Negative for arthralgias and back pain.  Skin:  Negative for color change and rash.  Neurological:  Negative for seizures and syncope.  All other systems reviewed and are negative.  Physical Exam Updated Vital Signs BP (!) 186/94   Pulse 80   Temp 98.3 F (36.8 C)   Resp 14   SpO2 100%   Physical Exam Vitals and nursing note reviewed.  Constitutional:      Appearance: He is well-developed.  HENT:     Head: Normocephalic and atraumatic.  Eyes:     Conjunctiva/sclera: Conjunctivae normal.  Cardiovascular:     Rate and Rhythm: Normal rate and regular rhythm.     Heart sounds: No murmur heard.    Comments: No palpable thrill over left upper extremity fistula Pulmonary:     Effort: Pulmonary effort is normal. No respiratory distress.     Breath sounds: Normal breath sounds.  Abdominal:     Palpations: Abdomen is soft.     Tenderness: There is no abdominal tenderness.  Musculoskeletal:     Cervical back: Neck supple.  Skin:    General: Skin is warm and dry.  Neurological:     Mental Status: He is alert.    ED Results / Procedures / Treatments   Labs (all labs ordered are listed, but only abnormal results are displayed) Labs Reviewed  CBC WITH  DIFFERENTIAL/PLATELET - Abnormal; Notable for the following components:      Result Value   RBC 3.20 (*)    Hemoglobin 11.4 (*)    HCT 33.3 (*)  MCV 104.1 (*)    MCH 35.6 (*)    Platelets 147 (*)    All other components within normal limits  COMPREHENSIVE METABOLIC PANEL - Abnormal; Notable for the following components:   BUN 64 (*)    Creatinine, Ser 6.80 (*)    Albumin 3.2 (*)    GFR, Estimated 9 (*)    All other components within normal limits  CBG MONITORING, ED - Abnormal; Notable for the following components:   Glucose-Capillary 100 (*)    All other components within normal limits  RESP PANEL BY RT-PCR (FLU A&B, COVID) ARPGX2  CBC WITH DIFFERENTIAL/PLATELET  COMPREHENSIVE METABOLIC PANEL  MAGNESIUM  HIV ANTIBODY (ROUTINE TESTING W REFLEX)    EKG None  Radiology DG Chest 2 View  Result Date: 05/27/2021 CLINICAL DATA:  Shortness of breath. EXAM: CHEST - 2 VIEW COMPARISON:  01/02/2021 FINDINGS: Both lungs are clear. Heart and mediastinum are within normal limits. Trachea is midline. No pleural effusions. Negative for a pneumothorax. IMPRESSION: No active cardiopulmonary disease. Electronically Signed   By: Markus Daft M.D.   On: 05/27/2021 10:31    Procedures Procedures   Medications Ordered in ED Medications  metoCLOPramide (REGLAN) injection 10 mg (has no administration in time range)  diphenhydrAMINE (BENADRYL) capsule 25 mg (has no administration in time range)  amLODipine (NORVASC) tablet 5 mg (has no administration in time range)  atorvastatin (LIPITOR) tablet 10 mg (has no administration in time range)  ziprasidone (GEODON) capsule 20 mg (has no administration in time range)  ziprasidone (GEODON) capsule 40 mg (has no administration in time range)  famotidine (PEPCID) tablet 20 mg (has no administration in time range)  metoCLOPramide (REGLAN) tablet 10 mg (has no administration in time range)  pantoprazole (PROTONIX) EC tablet 40 mg (has no administration in  time range)  sucralfate (CARAFATE) tablet 1 g (has no administration in time range)  divalproex (DEPAKOTE) DR tablet 750 mg (has no administration in time range)  brimonidine (ALPHAGAN) 0.15 % ophthalmic solution 1 drop (has no administration in time range)  dorzolamide-timolol (COSOPT) 22.3-6.8 MG/ML ophthalmic solution 1 drop (has no administration in time range)  insulin aspart (novoLOG) injection 0-6 Units (has no administration in time range)  insulin aspart (novoLOG) injection 0-5 Units (has no administration in time range)  insulin aspart protamine - aspart (NOVOLOG 70/30 MIX) FlexPen 25 Units (has no administration in time range)  insulin aspart protamine- aspart (NOVOLOG MIX 70/30) injection 30 Units (has no administration in time range)  acetaminophen (TYLENOL) tablet 650 mg (has no administration in time range)    Or  acetaminophen (TYLENOL) suppository 650 mg (has no administration in time range)    ED Course  I have reviewed the triage vital signs and the nursing notes.  Pertinent labs & imaging results that were available during my care of the patient were reviewed by me and considered in my medical decision making (see chart for details).  Clinical Course as of 05/27/21 2339  Mon May 27, 2021  1111 Discussed with Dr. Kasandra Knudsen of vascular surgery recommended discussion with interventional radiology  Discussed with Anderson Malta of interventional radiology NP.  She will discuss with attending physician after their current procedure and inform me of scheduling update. [WF]    Clinical Course User Index [WF] Tedd Sias, [place]   MDM Rules/Calculators/A&P                           Patient seen emergency  department for evaluation of a clotted left upper extremity dialysis fistula.  Physical exam reveals no palpable thrill over the dialysis fistula but is otherwise unremarkable.  While in the lobby for approximately 12 hours, the patient's care was directed by the physician assistant  who spoke with Dr. Marylou Mccoy of vascular surgery who recommended discussion with interventional radiology.  Interventional radiology has scheduled the patient for a declotting procedure at 9 AM.  Laboratory evaluation revealing a creatinine of 6.8, BUN 64 but a normal potassium at 5.3.  Patient then admitted to medicine to facilitate this procedure being performed and then having routine dialysis afterwards.  Patient then admitted. Final Clinical Impression(s) / ED Diagnoses Final diagnoses:  Complication of arteriovenous dialysis fistula, initial encounter    Rx / DC Orders ED Discharge Orders     None        , Debe Coder, MD 05/27/21 2342

## 2021-05-28 ENCOUNTER — Other Ambulatory Visit: Payer: Self-pay | Admitting: Radiology

## 2021-05-28 ENCOUNTER — Inpatient Hospital Stay (HOSPITAL_COMMUNITY): Admit: 2021-05-28 | Discharge: 2021-05-28 | Disposition: A | Discharge status: Home / Self Care | Payer: Medicare HMO

## 2021-05-28 ENCOUNTER — Observation Stay (HOSPITAL_COMMUNITY): Payer: Medicare HMO

## 2021-05-28 DIAGNOSIS — D631 Anemia in chronic kidney disease: Secondary | ICD-10-CM | POA: Diagnosis not present

## 2021-05-28 DIAGNOSIS — N186 End stage renal disease: Secondary | ICD-10-CM | POA: Diagnosis not present

## 2021-05-28 DIAGNOSIS — E1129 Type 2 diabetes mellitus with other diabetic kidney complication: Secondary | ICD-10-CM | POA: Diagnosis not present

## 2021-05-28 DIAGNOSIS — T82590A Other mechanical complication of surgically created arteriovenous fistula, initial encounter: Secondary | ICD-10-CM | POA: Diagnosis not present

## 2021-05-28 DIAGNOSIS — I5030 Unspecified diastolic (congestive) heart failure: Secondary | ICD-10-CM | POA: Diagnosis not present

## 2021-05-28 DIAGNOSIS — T82868A Thrombosis of vascular prosthetic devices, implants and grafts, initial encounter: Secondary | ICD-10-CM | POA: Diagnosis not present

## 2021-05-28 DIAGNOSIS — Z794 Long term (current) use of insulin: Secondary | ICD-10-CM | POA: Diagnosis not present

## 2021-05-28 DIAGNOSIS — I132 Hypertensive heart and chronic kidney disease with heart failure and with stage 5 chronic kidney disease, or end stage renal disease: Secondary | ICD-10-CM | POA: Diagnosis not present

## 2021-05-28 DIAGNOSIS — Z992 Dependence on renal dialysis: Secondary | ICD-10-CM | POA: Diagnosis not present

## 2021-05-28 HISTORY — PX: IR THROMBECTOMY AV FISTULA W/THROMBOLYSIS/PTA INC/SHUNT/IMG LEFT: IMG6106

## 2021-05-28 HISTORY — PX: IR US GUIDE VASC ACCESS LEFT: IMG2389

## 2021-05-28 LAB — COMPREHENSIVE METABOLIC PANEL
ALT: 11 U/L (ref 0–44)
AST: 16 U/L (ref 15–41)
Albumin: 2.9 g/dL — ABNORMAL LOW (ref 3.5–5.0)
Alkaline Phosphatase: 46 U/L (ref 38–126)
Anion gap: 9 (ref 5–15)
BUN: 62 mg/dL — ABNORMAL HIGH (ref 6–20)
CO2: 22 mmol/L (ref 22–32)
Calcium: 8.8 mg/dL — ABNORMAL LOW (ref 8.9–10.3)
Chloride: 104 mmol/L (ref 98–111)
Creatinine, Ser: 6.45 mg/dL — ABNORMAL HIGH (ref 0.61–1.24)
GFR, Estimated: 10 mL/min — ABNORMAL LOW (ref >60)
Glucose, Bld: 161 mg/dL — ABNORMAL HIGH (ref 70–99)
Potassium: 3.8 mmol/L (ref 3.5–5.1)
Sodium: 135 mmol/L (ref 135–145)
Total Bilirubin: 0.8 mg/dL (ref 0.3–1.2)
Total Protein: 6 g/dL — ABNORMAL LOW (ref 6.5–8.1)

## 2021-05-28 LAB — HEPATITIS B SURFACE ANTIBODY,QUALITATIVE: Hep B S Ab: REACTIVE — AB

## 2021-05-28 LAB — CBC WITH DIFFERENTIAL/PLATELET
Abs Immature Granulocytes: 0.01 10*3/uL (ref 0.00–0.07)
Basophils Absolute: 0 10*3/uL (ref 0.0–0.1)
Basophils Relative: 0 %
Eosinophils Absolute: 0.1 10*3/uL (ref 0.0–0.5)
Eosinophils Relative: 2 %
HCT: 31.8 % — ABNORMAL LOW (ref 39.0–52.0)
Hemoglobin: 10.6 g/dL — ABNORMAL LOW (ref 13.0–17.0)
Immature Granulocytes: 0 %
Lymphocytes Relative: 29 %
Lymphs Abs: 1.3 10*3/uL (ref 0.7–4.0)
MCH: 35.7 pg — ABNORMAL HIGH (ref 26.0–34.0)
MCHC: 33.3 g/dL (ref 30.0–36.0)
MCV: 107.1 fL — ABNORMAL HIGH (ref 80.0–100.0)
Monocytes Absolute: 0.5 10*3/uL (ref 0.1–1.0)
Monocytes Relative: 11 %
Neutro Abs: 2.6 10*3/uL (ref 1.7–7.7)
Neutrophils Relative %: 58 %
Platelets: 129 10*3/uL — ABNORMAL LOW (ref 150–400)
RBC: 2.97 MIL/uL — ABNORMAL LOW (ref 4.22–5.81)
RDW: 12.7 % (ref 11.5–15.5)
WBC: 4.6 10*3/uL (ref 4.0–10.5)
nRBC: 0 % (ref 0.0–0.2)

## 2021-05-28 LAB — HIV ANTIBODY (ROUTINE TESTING W REFLEX): HIV Screen 4th Generation wRfx: NONREACTIVE

## 2021-05-28 LAB — HEPATITIS B SURFACE ANTIGEN: Hepatitis B Surface Ag: NONREACTIVE

## 2021-05-28 LAB — GLUCOSE, CAPILLARY
Glucose-Capillary: 93 mg/dL (ref 70–99)
Glucose-Capillary: 176 mg/dL — ABNORMAL HIGH (ref 70–99)
Glucose-Capillary: 93 mg/dL (ref 70–99)

## 2021-05-28 LAB — MAGNESIUM: Magnesium: 2.3 mg/dL (ref 1.7–2.4)

## 2021-05-28 MED ORDER — TETRACAINE HCL 1 % IJ SOLN
10.0000 mg | Freq: Once | INTRAMUSCULAR | Status: DC
Start: 2021-05-28 — End: 2021-05-29
  Filled 2021-05-28: qty 2

## 2021-05-28 MED ORDER — HEPARIN SODIUM (PORCINE) 1000 UNIT/ML IJ SOLN
INTRAMUSCULAR | Status: DC | PRN
  Administered 2021-05-28: 3000 [IU] via INTRAVENOUS

## 2021-05-28 MED ORDER — FENTANYL CITRATE (PF) 100 MCG/2ML IJ SOLN
INTRAMUSCULAR | Status: DC | PRN
  Administered 2021-05-28: 25 ug via INTRAVENOUS

## 2021-05-28 MED ORDER — SODIUM CHLORIDE 0.9 % IV SOLN: INTRAVENOUS | Status: DC

## 2021-05-28 MED ORDER — SODIUM CHLORIDE 0.9 % IV SOLN: 100.0000 mL | INTRAVENOUS | Status: DC | PRN

## 2021-05-28 MED ORDER — MIDAZOLAM HCL 2 MG/2ML IJ SOLN
INTRAMUSCULAR | Status: DC | PRN
  Administered 2021-05-28: .5 mg via INTRAVENOUS

## 2021-05-28 MED ORDER — HEPARIN SODIUM (PORCINE) 1000 UNIT/ML IJ SOLN
INTRAMUSCULAR | Status: AC
  Filled 2021-05-28: qty 1

## 2021-05-28 MED ORDER — FAMOTIDINE 20 MG PO TABS: 20.0000 mg | ORAL_TABLET | Freq: Every day | ORAL | Status: AC

## 2021-05-28 MED ORDER — ALTEPLASE 2 MG IJ SOLR: 2.0000 mg | Freq: Once | INTRAMUSCULAR | Status: DC | PRN

## 2021-05-28 MED ORDER — HEPARIN SODIUM (PORCINE) 1000 UNIT/ML DIALYSIS
1000.0000 [IU] | INTRAMUSCULAR | Status: DC | PRN
  Filled 2021-05-28: qty 1

## 2021-05-28 MED ORDER — FENTANYL CITRATE (PF) 100 MCG/2ML IJ SOLN
INTRAMUSCULAR | Status: AC
  Filled 2021-05-28: qty 2

## 2021-05-28 MED ORDER — IOHEXOL 350 MG/ML SOLN
100.0000 mL | Freq: Once | INTRAVENOUS | Status: AC | PRN
  Administered 2021-05-28: 50 mL via INTRAVENOUS

## 2021-05-28 MED ORDER — FAMOTIDINE 20 MG PO TABS
20.0000 mg | ORAL_TABLET | Freq: Every day | ORAL | Status: DC
  Filled 2021-05-28: qty 1

## 2021-05-28 MED ORDER — ALTEPLASE 2 MG IJ SOLR
INTRAMUSCULAR | Status: AC
  Filled 2021-05-28: qty 2

## 2021-05-28 MED ORDER — CHLORHEXIDINE GLUCONATE CLOTH 2 % EX PADS: 6.0000 | MEDICATED_PAD | Freq: Every day | CUTANEOUS | Status: DC

## 2021-05-28 MED ORDER — PENTAFLUOROPROP-TETRAFLUOROETH EX AERO: 1.0000 "application " | INHALATION_SPRAY | CUTANEOUS | Status: DC | PRN

## 2021-05-28 MED ORDER — MIDAZOLAM HCL 2 MG/2ML IJ SOLN
INTRAMUSCULAR | Status: AC
  Filled 2021-05-28: qty 2

## 2021-05-28 MED ORDER — ALTEPLASE 1 MG/ML SYRINGE FOR VASCULAR PROCEDURE
INTRAMUSCULAR | Status: DC | PRN
  Administered 2021-05-28: 2 mg via INTRA_ARTERIAL

## 2021-05-28 NOTE — Sedation Documentation (Signed)
Took patient CBG at 0853 results 56 doctor notified.

## 2021-05-28 NOTE — ED Notes (Addendum)
Attempted to call report x 1  

## 2021-05-28 NOTE — Discharge Summary (Signed)
Physician Discharge Summary   Patient name: Timothy Clark  Admit date:     05/27/2021  Discharge date: 05/28/2021  Discharge Physician: Dwyane Dee   PCP: [name], MD   Recommendations at discharge:  Skin purse string sutures can be removed at next HD session per radiology  Discharge Diagnoses Active Problems:   Type 2 diabetes mellitus with ESRD (end-stage renal disease) ([place])   Essential hypertension   GERD (gastroesophageal reflux disease)   ESRD on dialysis [facility])   Resolved Diagnoses Principal Problem (Resolved):   Clotted renal dialysis AV graft [facility])   Hospital Course    * Clotted renal dialysis AV graft (HCC)-resolved as of 05/28/2021 - declotted in IR on 10/18  ESRD on dialysis [facility]) Has not had HD since last Wednesday.  -completed HD in hospital on 10/18 prior to d/c   GERD (gastroesophageal reflux disease) Stable.  Essential hypertension Stable.  Type 2 diabetes mellitus with ESRD (end-stage renal disease) ([place]) Add SSI.     Procedures performed:  AV graft declot with thrombolysis, thrombectomy and angioplasty, 05/28/21  Condition at discharge: stable  Exam Physical Exam Constitutional:      Appearance: Normal appearance.  HENT:     Head: Atraumatic.     Mouth/Throat:     Mouth: Mucous membranes are moist.  Eyes:     Extraocular Movements: Extraocular movements intact.  Cardiovascular:     Rate and Rhythm: Normal rate and regular rhythm.  Pulmonary:     Effort: No respiratory distress.  Abdominal:     General: Bowel sounds are normal. There is no distension.     Palpations: Abdomen is soft.     Tenderness: There is no abdominal tenderness.  Musculoskeletal:     Cervical back: Normal range of motion and neck supple.     Comments: Left graft site noted with lack of bruit and thrill   Skin:    General: Skin is warm and dry.  Neurological:     General: No focal deficit present.     Mental Status: He is alert.  Psychiatric:         Mood and Affect: Mood normal.        Behavior: Behavior normal.     Disposition: Home  Discharge time: greater than 30 minutes.   Allergies as of 05/28/2021       Reactions   Donnatal [phenobarbital-belladonna Alk] Anaphylaxis, Other (See Comments)   Reaction to GI cocktail   Fish Allergy Anaphylaxis, Hives, Rash   Haldol [haloperidol Lactate] Other (See Comments)   Chest Pain   Lidocaine Anaphylaxis, Other (See Comments)   Reaction to GI cocktail   Maalox [calcium Carbonate Antacid] Anaphylaxis, Other (See Comments)   Reaction to GI cocktail   Shellfish Allergy Hives   Per patient: Patient stated he breaks out in hives when eating shellfish   Aspirin Hives, Itching   Doxycycline Hives, Nausea And Vomiting, Other (See Comments)   Severe    Keflex [cephalexin] Hives, Itching, Nausea And Vomiting   Marinol [dronabinol] Nausea And Vomiting, Other (See Comments)   Caused worsening vomiting.    Other Swelling   Penicillins Hives, Itching, Other (See Comments), Rash   Has patient had a PCN reaction causing immediate rash, facial/tongue/throat swelling, SOB or lightheadedness with hypotension: yes Has patient had a PCN reaction causing severe rash involving mucus membranes or skin necrosis: yes Has patient had a PCN reaction that required hospitalization no Has patient had a PCN reaction occurring within the  last 10 years: yes If all of the above answers are "NO", then may proceed with Cephalospor   Bactrim [sulfamethoxazole-trimethoprim] Itching        Medication List     STOP taking these medications    oxyCODONE-acetaminophen 5-325 MG tablet Commonly known as: Percocet   pantoprazole 40 MG tablet Commonly known as: PROTONIX       TAKE these medications    ALPRAZolam 0.5 MG tablet Commonly known as: XANAX Take 0.5 mg by mouth 2 (two) times daily as needed for anxiety.   amLODipine 5 MG tablet Commonly known as: NORVASC Take 5 mg by mouth daily.    atorvastatin 10 MG tablet Commonly known as: LIPITOR Take 1 tablet (10 mg total) by mouth at bedtime.   brimonidine 0.15 % ophthalmic solution Commonly known as: ALPHAGAN Place 1 drop into both eyes in the morning and at bedtime.   dicyclomine 20 MG tablet Commonly known as: BENTYL Take 1 tablet (20 mg total) by mouth 2 (two) times daily. What changed:  when to take this reasons to take this   divalproex 250 MG DR tablet Commonly known as: DEPAKOTE Take 750 mg by mouth 3 (three) times daily.   dorzolamide-timolol 22.3-6.8 MG/ML ophthalmic solution Commonly known as: COSOPT Place 1 drop into both eyes 2 (two) times daily.   famotidine 20 MG tablet Commonly known as: PEPCID Take 1 tablet (20 mg total) by mouth daily. What changed: when to take this   folic acid 1 MG tablet Commonly known as: FOLVITE Take 1 mg by mouth daily.   FUSION PLUS PO Take 1 capsule by mouth daily.   gentamicin cream 0.1 % Commonly known as: GARAMYCIN Apply 1 application topically 2 (two) times daily.   Gvoke HypoPen 2-Pack 1 MG/0.2ML Soaj Generic drug: Glucagon Inject 1 Syringe into the skin once as needed (hypoglycemia).   Leader Unifine Pentips Plus 32G X 4 MM Misc Generic drug: Insulin Pen Needle   metoCLOPramide 10 MG tablet Commonly known as: REGLAN Take 1 tablet (10 mg total) by mouth every 6 (six) hours.   NovoLOG Mix 70/30 FlexPen (70-30) 100 UNIT/ML FlexPen Generic drug: insulin aspart protamine - aspart Inject 25-35 Units into the skin See admin instructions. Inject 35 units in the morning and 25 every evening - Sliding scale   ondansetron 4 MG disintegrating tablet Commonly known as: ZOFRAN-ODT Take 4 mg by mouth every 8 (eight) hours as needed for nausea or vomiting.   polyethylene glycol 17 g packet Commonly known as: MIRALAX / GLYCOLAX Take 17 g by mouth daily as needed for mild constipation. What changed: reasons to take this   senna-docusate 8.6-50 MG  tablet Commonly known as: Senokot-S Take 1 tablet by mouth 2 (two) times daily.   sitaGLIPtin 25 MG tablet Commonly known as: JANUVIA Take 25 mg by mouth daily.   sucralfate 1 g tablet Commonly known as: CARAFATE Take 1 g by mouth 4 (four) times daily.   torsemide 20 MG tablet Commonly known as: DEMADEX Take 40 mg by mouth daily. At noon   ziprasidone 20 MG capsule Commonly known as: GEODON Take 1 tab with breakfast and 2 tabs at HS What changed:  how much to take how to take this when to take this additional instructions        CT ABDOMEN PELVIS WO CONTRAST  Result Date: 05/04/2021 CLINICAL DATA:  Epigastric pain vomiting EXAM: CT ABDOMEN AND PELVIS WITHOUT CONTRAST TECHNIQUE: Multidetector CT imaging of the abdomen and pelvis  was performed following the standard protocol without IV contrast. COMPARISON:  CT 03/25/2021, 09/20/2020 FINDINGS: Lower chest: Lung bases demonstrate no acute consolidation or effusion. Normal cardiac size. Hepatobiliary: No focal hepatic abnormality. No biliary dilatation. No calcified stone Pancreas: Unremarkable. No pancreatic ductal dilatation or surrounding inflammatory changes. Spleen: Normal in size without focal abnormality. Adrenals/Urinary Tract: Adrenal glands are normal. Mild bilateral renal atrophy. No hydronephrosis. The bladder is unremarkable. Stomach/Bowel: Stomach is within normal limits. Appendix appears normal. No evidence of bowel wall thickening, distention, or inflammatory changes. Vascular/Lymphatic: Nonaneurysmal aorta. Mild atherosclerosis. No suspicious nodes Reproductive: Prostate is unremarkable. Other: Negative for free air or free fluid. Fat containing right inguinal hernia Musculoskeletal: Degenerative changes. No acute osseous abnormality. Chronic right-sided rib fractures. IMPRESSION: 1. No CT evidence for acute intra-abdominal or pelvic abnormality. Electronically Signed   By: Donavan Foil M.D.   On: 05/04/2021 16:19   DG  Chest 2 View  Result Date: 05/27/2021 CLINICAL DATA:  Shortness of breath. EXAM: CHEST - 2 VIEW COMPARISON:  01/02/2021 FINDINGS: Both lungs are clear. Heart and mediastinum are within normal limits. Trachea is midline. No pleural effusions. Negative for a pneumothorax. IMPRESSION: No active cardiopulmonary disease. Electronically Signed   By: Markus Daft M.D.   On: 05/27/2021 10:31   IR US Guide Vasc Access Left  Result Date: 05/28/2021 INDICATION: 50 year old with end-stage renal disease and thrombosed left upper extremity AV graft. EXAM: 1. AV graft declot with thrombolysis, mechanical thrombectomy and balloon angioplasty 2. Ultrasound guidance for vascular access Physician: Stephan Minister. Henn, MD MEDICATIONS: Heparin 3000 units, TPA 2 mg ANESTHESIA/SEDATION: Moderate (conscious) sedation was employed during this procedure. A total of Versed 0.55m and fentanyl 25 mcg was administered intravenously at the order of the provider performing the procedure. Total intra-service moderate sedation time: 63 minutes. Patient's level of consciousness and vital signs were monitored continuously by radiology nurse throughout the procedure under the supervision of the provider performing the procedure. FLUOROSCOPY TIME:  Fluoroscopy Time: 8 minutes, 48 seconds, 3 7 mGy CONTRAST:  50 mL Omnipaque [id number]: None immediate. PROCEDURE: The procedure was explained to the patient. The risks and benefits of the procedure were discussed and the patient's questions were addressed. Informed consent was obtained from the patient. Patient was placed supine on the interventional table. Ultrasound was used to evaluate for access. Ultrasound confirmed an occluded left upper arm AV graft. Ultrasound image was saved for documentation. Left upper arm was prepped and draped in sterile fashion. Maximal barrier sterile technique was utilized including caps, mask, sterile gowns, sterile gloves, sterile drape, hand hygiene and skin  antiseptic. Skin was anesthetized using 1% tetracaine. The graft was accessed using 21 gauge needles towards the venous and arterial anastomoses with ultrasound guidance.Micropuncture catheters were placed. 2 mg of TPA was infused through the micropuncture catheters. The vascular access pointing towards the central veins was upsized to a 6-French vascular sheath. A 5-French catheter was advanced into the central venous structures and a central venogram was performed. Fluoroscopic images were saved for documentation. The graft and outflow vein near the anastomosis was treated with the Angiojet mechanical thrombectomy. The venous anastomosis was angioplastied with a 6 mm x 40 mm balloon. Micropuncture catheter pointing towards the arterial system was upsized to a 6 [place] sheath. A wire was advanced into the arterial system. The arterial plug was pulled using a 5 [place] balloon. There was flow in the graft. Angiogram demonstrated residual thrombus in the graft and near the anastomosis. The  graft and anastomosis were treated again with the Angiojet device. Follow-up angiogram was performed. The venous anastomosis and outflow vein were angioplastied again with a 6 mm balloon and follow-up angioplasty images were obtained. The anastomosis and outflow vein were angioplastied with a 7 mm x 40 mm balloon and follow-up angiogram images were obtained. Fogarty balloon was inflated in the graft and reflux images obtained to evaluate the arterial anastomosis. The vascular sheaths were removed with purse string sutures. No immediate complication. FINDINGS: Left upper arm AV graft was occluded. Central veins were patent. Left upper arm AV graft was widely patent after the declot procedure. At the end of the procedure, the venous anastomosis had mild irregularity but no significant stenosis. Arterial anastomosis was patent. IMPRESSION: Successful declot of the left upper extremity AV graft. Left upper arm AV graft  remains amenable to percutaneous intervention. Electronically Signed   By: Markus Daft M.D.   On: 05/28/2021 13:10   DG Foot Complete Left  Result Date: 04/30/2021 Please see detailed radiograph report in office note.  IR THROMBECTOMY AV FISTULA W/THROMBOLYSIS/PTA INC/SHUNT/IMG LEFT  Result Date: 05/28/2021 INDICATION: 50 year old with end-stage renal disease and thrombosed left upper extremity AV graft. EXAM: 1. AV graft declot with thrombolysis, mechanical thrombectomy and balloon angioplasty 2. Ultrasound guidance for vascular access Physician: Stephan Minister. Henn, MD MEDICATIONS: Heparin 3000 units, TPA 2 mg ANESTHESIA/SEDATION: Moderate (conscious) sedation was employed during this procedure. A total of Versed 0.35m and fentanyl 25 mcg was administered intravenously at the order of the provider performing the procedure. Total intra-service moderate sedation time: 63 minutes. Patient's level of consciousness and vital signs were monitored continuously by radiology nurse throughout the procedure under the supervision of the provider performing the procedure. FLUOROSCOPY TIME:  Fluoroscopy Time: 8 minutes, 48 seconds, 3 7 mGy CONTRAST:  50 mL Omnipaque [id number]: None immediate. PROCEDURE: The procedure was explained to the patient. The risks and benefits of the procedure were discussed and the patient's questions were addressed. Informed consent was obtained from the patient. Patient was placed supine on the interventional table. Ultrasound was used to evaluate for access. Ultrasound confirmed an occluded left upper arm AV graft. Ultrasound image was saved for documentation. Left upper arm was prepped and draped in sterile fashion. Maximal barrier sterile technique was utilized including caps, mask, sterile gowns, sterile gloves, sterile drape, hand hygiene and skin antiseptic. Skin was anesthetized using 1% tetracaine. The graft was accessed using 21 gauge needles towards the venous and arterial  anastomoses with ultrasound guidance.Micropuncture catheters were placed. 2 mg of TPA was infused through the micropuncture catheters. The vascular access pointing towards the central veins was upsized to a 6-French vascular sheath. A 5-French catheter was advanced into the central venous structures and a central venogram was performed. Fluoroscopic images were saved for documentation. The graft and outflow vein near the anastomosis was treated with the Angiojet mechanical thrombectomy. The venous anastomosis was angioplastied with a 6 mm x 40 mm balloon. Micropuncture catheter pointing towards the arterial system was upsized to a 6 [place] sheath. A wire was advanced into the arterial system. The arterial plug was pulled using a 5 [place] balloon. There was flow in the graft. Angiogram demonstrated residual thrombus in the graft and near the anastomosis. The graft and anastomosis were treated again with the Angiojet device. Follow-up angiogram was performed. The venous anastomosis and outflow vein were angioplastied again with a 6 mm balloon and follow-up angioplasty images were obtained. The anastomosis and outflow  vein were angioplastied with a 7 mm x 40 mm balloon and follow-up angiogram images were obtained. Fogarty balloon was inflated in the graft and reflux images obtained to evaluate the arterial anastomosis. The vascular sheaths were removed with purse string sutures. No immediate complication. FINDINGS: Left upper arm AV graft was occluded. Central veins were patent. Left upper arm AV graft was widely patent after the declot procedure. At the end of the procedure, the venous anastomosis had mild irregularity but no significant stenosis. Arterial anastomosis was patent. IMPRESSION: Successful declot of the left upper extremity AV graft. Left upper arm AV graft remains amenable to percutaneous intervention. Electronically Signed   By: Markus Daft M.D.   On: 05/28/2021 13:10   Results for  orders placed or performed during the hospital encounter of 05/27/21  Resp Panel by RT-PCR (Flu A&B, Covid) Nasopharyngeal Swab     Status: None   Collection Time: 05/27/21  9:40 AM   Specimen: Nasopharyngeal Swab; Nasopharyngeal(NP) swabs in vial transport medium  Result Value Ref Range Status   SARS Coronavirus 2 by RT PCR NEGATIVE NEGATIVE Final    Comment: (NOTE) SARS-CoV-2 target nucleic acids are NOT DETECTED.  The SARS-CoV-2 RNA is generally detectable in upper respiratory specimens during the acute phase of infection. The lowest concentration of SARS-CoV-2 viral copies this assay can detect is 138 copies/mL. A negative result does not preclude SARS-Cov-2 infection and should not be used as the sole basis for treatment or other patient management decisions. A negative result may occur with  improper specimen collection/handling, submission of specimen other than nasopharyngeal swab, presence of viral mutation(s) within the areas targeted by this assay, and inadequate number of viral copies(<138 copies/mL). A negative result must be combined with clinical observations, patient history, and epidemiological information. The expected result is Negative.  Fact Sheet for Patients:  EntrepreneurPulse.com.au  Fact Sheet for Healthcare Providers:  IncredibleEmployment.be  This test is no t yet approved or cleared by the [place] FDA and  has been authorized for detection and/or diagnosis of SARS-CoV-2 by FDA under an Emergency Use Authorization (EUA). This EUA will remain  in effect (meaning this test can be used) for the duration of the COVID-19 declaration under Section 564(b)(1) of the Act, 21 U.S.C.section 360bbb-3(b)(1), unless the authorization is terminated  or revoked sooner.       Influenza A by PCR NEGATIVE NEGATIVE Final   Influenza B by PCR NEGATIVE NEGATIVE Final    Comment: (NOTE) The Xpert Xpress SARS-CoV-2/FLU/RSV plus  assay is intended as an aid in the diagnosis of influenza from Nasopharyngeal swab specimens and should not be used as a sole basis for treatment. Nasal washings and aspirates are unacceptable for Xpert Xpress SARS-CoV-2/FLU/RSV testing.  Fact Sheet for Patients: EntrepreneurPulse.com.au  Fact Sheet for Healthcare Providers: IncredibleEmployment.be  This test is not yet approved or cleared by the [place] FDA and has been authorized for detection and/or diagnosis of SARS-CoV-2 by FDA under an Emergency Use Authorization (EUA). This EUA will remain in effect (meaning this test can be used) for the duration of the COVID-19 declaration under Section 564(b)(1) of the Act, 21 U.S.C. section 360bbb-3(b)(1), unless the authorization is terminated or revoked.  Performed at [facility], [place] [address], [place],  [address]     Signed:  Dwyane Dee MD.  [facility] 05/28/2021, 2:22 PM

## 2021-05-28 NOTE — Procedures (Signed)
Interventional Radiology Procedure:   Indications: Thrombosed left arm AV graft  Procedure: AV graft declot with thrombolysis, thrombectomy and angioplasty  Findings: Occluded left upper arm AV graft.  Successful declot with balloon angioplasty up to 7 mm.  Complications: No immediate complications noted.     EBL: Minimal  Plan: AV graft is ready for dialysis.  Skin purse string sutures should be removed at next dialysis session.   Aniesa Boback R. Anselm Pancoast, MD  Pager: [phone]

## 2021-05-28 NOTE — Progress Notes (Signed)
Patient with hypotension during hemodialysis. UF was turned off and bolus was given. Dr. Johnney Ou notified of bp and gave telephone orders to keep SBP greater than 80 as long as patient remains asymptomatic. UF resumed with at net uf of 1.196. Patient alert with no complaints.

## 2021-05-28 NOTE — Progress Notes (Addendum)
Pt stated does not want to wait for discharge paper. IV was removed belonging was given to pt. Pt father waiting for pt. Accompanied pt to ER where pt father was waiting for pt to be taken home by car.Charge nurse was informed.

## 2021-05-28 NOTE — Progress Notes (Signed)
Admit: 05/27/2021 LOS: 0  29M ESRD MWF [facility] with clotted AVG  Subjective:  Seen after AVG declot with IR today, now for  HD  No c/o this AM  No intake/output data recorded.  Filed Weights   05/27/21 2350 05/28/21 0658  Weight: 117.9 kg 120.3 kg    Scheduled Meds:  amLODipine  5 mg Oral Daily   atorvastatin  10 mg Oral QHS   brimonidine  1 drop Both Eyes BID   Chlorhexidine Gluconate Cloth  6 each Topical Q0600   diphenhydrAMINE  25 mg Oral Once   divalproex  750 mg Oral TID   dorzolamide-timolol  1 drop Both Eyes BID   famotidine  20 mg Oral BID   fentaNYL       insulin aspart  0-5 Units Subcutaneous QHS   insulin aspart  0-6 Units Subcutaneous TID WC   insulin aspart protamine- aspart  25 Units Subcutaneous Q supper   insulin aspart protamine- aspart  30 Units Subcutaneous Q breakfast   metoCLOPramide (REGLAN) injection  10 mg Intravenous Once   metoCLOPramide  10 mg Oral Q6H   midazolam       pantoprazole  40 mg Oral BID AC   sucralfate  1 g Oral QID   tetracaine  10 mg Other Once   ziprasidone  20 mg Oral Daily   ziprasidone  40 mg Oral QHS   Continuous Infusions:  sodium chloride     sodium chloride     PRN Meds:.sodium chloride, sodium chloride, acetaminophen **OR** acetaminophen, alteplase, heparin, pentafluoroprop-tetrafluoroeth  Current Labs: reviewed    Physical Exam:  Blood pressure (!) 185/109, pulse 86, temperature 98.3 F (36.8 C), temperature source Oral, resp. rate 14, height 6' (1.829 m), weight 120.3 kg, SpO2 100 %. NAD RRR no rub CTAB LUE AVG +B/T No LEE Nonfocal  A Clotted AVG: declot planned today ESRD on HD MWF [facility] Anemia of ESRD HTN DM2  P HD today Prob ok for DC pos HD if uneventful Can return to outpt HD tomorrow  Medication Issues; Preferred narcotic agents for pain control are hydromorphone, fentanyl, and methadone. Morphine should not be used.  Baclofen should be avoided Avoid oral sodium phosphate and  magnesium citrate based laxatives / bowel preps    Pearson Grippe MD 05/28/2021, 9:53 AM  Recent Labs  Lab 05/27/21 0943 05/28/21 0134  NA 139 135  K 4.3 3.8  CL 103 104  CO2 23 22  GLUCOSE 95 161*  BUN 64* 62*  CREATININE 6.80* 6.45*  CALCIUM 9.0 8.8*   Recent Labs  Lab 05/27/21 0943 05/28/21 0134  WBC 5.3 4.6  NEUTROABS 3.8 2.6  HGB 11.4* 10.6*  HCT 33.3* 31.8*  MCV 104.1* 107.1*  PLT 147* 129*

## 2021-05-28 NOTE — ED Notes (Signed)
Breakfast Orders placed 

## 2021-05-28 NOTE — Consult Note (Signed)
[facility]  INPATIENT CONSULTATION  Reason for Consultation: ESRD and assoc conditions Requesting Provider: Dr. Bridgett Larsson  HPI: Timothy Clark is an 50 y.o. male with ESRD on HD MWF [facility], HTN, DM, HFpEF, GERD who is seen for ongoing care of ESRD and associated conditions.   Presented today for outpt thrombectomy of clotted AVG (last HD Wed, AVG clotted Friday) but had dyspnea, protracted vomiting episode requiring multiple rounds of suctioning.  He had a thrombectomy last month and reported sedation was inadequate.  Due to concern for potential respiratory compromise was deemed not suitable for outpt thrombectomy. Pt was asked to present to [facility] ED . Labs K 4.3, Bicarb 23, BUN 64, Hb 11.4, CXR ok.   Per report from HD clinic he frequently is volume overloaded due to dietary discretion.   PMH: Past Medical History:  Diagnosis Date   Anemia    Anxiety    CHF (congestive heart failure) (HCC)    diastolic dysfunction   Chronic abdominal pain    COVID-19 07/2019   Depression    Esophagitis    ESRD on hemodialysis (HCC)    undergoing evaluation for transplant   Eye hemorrhage    Gastritis    Gastroparesis    GERD (gastroesophageal reflux disease)    Glaucoma    Helicobacter pylori (H. pylori) infection 2016   ERADICATION DOCUMENTED VIA STOOL TEST in AUG 2016 at [facility]   Hiccups    Hypertension    Nausea and vomiting    chronic, recurrent   Neuropathy    feet   Renal insufficiency    Type 2 diabetes mellitus with complications ([place])    diagnosed around age 8   Vitreous hemorrhage of left eye ([place]) 12/15/2019   PSH: Past Surgical History:  Procedure Laterality Date   AMPUTATION TOE Right    great toe   AV FISTULA INSERTION W/ RF MAGNETIC GUIDANCE Left 04/06/2019   Procedure: AV FISTULA INSERTION W/RF MAGNETIC GUIDANCE;  Surgeon: Katha Cabal, MD;  Location: [place] [facility];  Service: Cardiovascular;  Laterality: Left;   AV FISTULA PLACEMENT Left  05/06/2019   Procedure: INSERTION OF ARTERIOVENOUS (AV) GORE-TEX GRAFT ARM ( FOREARM LOOP );  Surgeon: Katha Cabal, MD;  Location: [facility];  Service: Vascular;  Laterality: Left;   AV FISTULA PLACEMENT Left 03/21/2020   Procedure: INSERTION OF ARTERIOVENOUS (AV) GORE-TEX GRAFT ARM ( BRACHIAL AXILLARY );  Surgeon: Katha Cabal, MD;  Location: [facility];  Service: Vascular;  Laterality: Left;   CATARACT EXTRACTION W/PHACO Left 05/19/2016   Procedure: CATARACT EXTRACTION PHACO AND INTRAOCULAR LENS PLACEMENT LEFT EYE;  Surgeon: Tonny Branch, MD;  Location: [facility];  Service: Ophthalmology;  Laterality: Left;  CDE: 7.30   CATARACT EXTRACTION W/PHACO Right 06/23/2016   Procedure: CATARACT EXTRACTION PHACO AND INTRAOCULAR LENS PLACEMENT RIGHT EYE CDE=9.87;  Surgeon: Tonny Branch, MD;  Location: [facility];  Service: Ophthalmology;  Laterality: Right;  right   DIALYSIS/PERMA CATHETER REMOVAL N/A 07/19/2019   Procedure: DIALYSIS/PERMA CATHETER REMOVAL;  Surgeon: Katha Cabal, MD;  Location: [place] [facility];  Service: Cardiovascular;  Laterality: N/A;   ESOPHAGOGASTRODUODENOSCOPY  2015   Dr. Britta Mccreedy   ESOPHAGOGASTRODUODENOSCOPY N/A 10/26/2014   RMR: Distal esophagititis-likely reflux related although an element of pill induced injuruy no exclueded  status post biopsy. Diffusely abnormal gastric mucosa of uncertain signigicane -status post gastric biopsy. Focal area of excoriation in the cardia most consistant with a trauma of heaving.    ESOPHAGOGASTRODUODENOSCOPY  08/2015   [facility]: mild esophagitis and old gastric contents   EYE SURGERY Left 2015   stent placed to left eye   EYE SURGERY Right 06/2019   per patient- to remove scar tissue   INCISION AND DRAINAGE OF WOUND Right 07/16/2017   Procedure: IRRIGATION AND DEBRIDEMENT OF SOFT TISSUE OF ULCERATION RIGHT FOOT;  Surgeon: Caprice Beaver, DPM;  Location: [facility];  Service: Podiatry;  Laterality: Right;   IR THROMBECTOMY AV FISTULA  W/THROMBOLYSIS/PTA INC/SHUNT/IMG LEFT Left 09/19/2020   IR US GUIDE VASC ACCESS LEFT  09/19/2020   PERIPHERAL VASCULAR THROMBECTOMY Left 09/27/2019   Procedure: PERIPHERAL VASCULAR THROMBECTOMY;  Surgeon: Katha Cabal, MD;  Location: [place] [facility];  Service: Cardiovascular;  Laterality: Left;   PERIPHERAL VASCULAR THROMBECTOMY Left 10/27/2019   Procedure: PERIPHERAL VASCULAR THROMBECTOMY;  Surgeon: Algernon Huxley, MD;  Location: [place] [facility];  Service: Cardiovascular;  Laterality: Left;   PERIPHERAL VASCULAR THROMBECTOMY Left 12/13/2019   Procedure: PERIPHERAL VASCULAR THROMBECTOMY;  Surgeon: Katha Cabal, MD;  Location: [place] [facility];  Service: Cardiovascular;  Laterality: Left;     Past Medical History:  Diagnosis Date   Anemia    Anxiety    CHF (congestive heart failure) (HCC)    diastolic dysfunction   Chronic abdominal pain    COVID-19 07/2019   Depression    Esophagitis    ESRD on hemodialysis ([place])    undergoing evaluation for transplant   Eye hemorrhage    Gastritis    Gastroparesis    GERD (gastroesophageal reflux disease)    Glaucoma    Helicobacter pylori (H. pylori) infection 2016   ERADICATION DOCUMENTED VIA STOOL TEST in AUG 2016 at [facility]   Hiccups    Hypertension    Nausea and vomiting    chronic, recurrent   Neuropathy    feet   Renal insufficiency    Type 2 diabetes mellitus with complications ([place])    diagnosed around age 63   Vitreous hemorrhage of left eye ([place]) 12/15/2019    Medications:  I have reviewed the patient's current medications.   (Not in a hospital admission)   ALLERGIES:   Allergies  Allergen Reactions   Donnatal [Phenobarbital-Belladonna Alk] Anaphylaxis and Other (See Comments)    Reaction to GI cocktail   Fish Allergy Anaphylaxis, Hives and Rash   Haldol [Haloperidol Lactate] Other (See Comments)    Chest Pain   Lidocaine Anaphylaxis and Other (See Comments)    Reaction to GI cocktail   Maalox  [Calcium Carbonate Antacid] Anaphylaxis and Other (See Comments)    Reaction to GI cocktail   Shellfish Allergy Hives    Per patient: Patient stated he breaks out in hives when eating shellfish   Aspirin Hives and Itching   Doxycycline Hives, Nausea And Vomiting and Other (See Comments)    Severe    Keflex [Cephalexin] Hives, Itching and Nausea And Vomiting   Marinol [Dronabinol] Nausea And Vomiting and Other (See Comments)    Caused worsening vomiting.    Other Swelling   Penicillins Hives, Itching, Other (See Comments) and Rash    Has patient had a PCN reaction causing immediate rash, facial/tongue/throat swelling, SOB or lightheadedness with hypotension: yes Has patient had a PCN reaction causing severe rash involving mucus membranes or skin necrosis: yes Has patient had a PCN reaction that required hospitalization no Has patient had a PCN reaction occurring within the last 10 years: yes If all of the above answers are "NO",  then may proceed with Cephalospor   Bactrim [Sulfamethoxazole-Trimethoprim] Itching    FAM HX: Family History  Problem Relation Age of Onset   Ovarian cancer Mother    Heart attack Father    Colon polyps Father    Cervical cancer Sister    Diabetes Sister    Colon cancer Neg Hx     Social History:   reports that he has never smoked. He has never used smokeless tobacco. He reports that he does not drink alcohol and does not use drugs.  ROS: 12 system ROS neg except per HPI above  Blood pressure 140/88, pulse 64, temperature 97.6 F (36.4 C), temperature source Oral, resp. rate 15, height 6' (1.829 m), weight 117.9 kg, SpO2 99 %. PHYSICAL EXAM: Gen: obese man who is comfortable  Eyes: anciteric ENT: MMM Neck: supple, thick CV:  RRR, no rub Abd:  soft, obese Lungs: no inc WOB at rest, on RA Extr: 1+ tibial edema, LUE AVG no bruit or thrill Neuro: grossly nonfocal   Results for orders placed or performed during the hospital encounter of 05/27/21  (from the past 48 hour(s))  Resp Panel by RT-PCR (Flu A&B, Covid) Nasopharyngeal Swab     Status: None   Collection Time: 05/27/21  9:40 AM   Specimen: Nasopharyngeal Swab; Nasopharyngeal(NP) swabs in vial transport medium  Result Value Ref Range   SARS Coronavirus 2 by RT PCR NEGATIVE NEGATIVE    Comment: (NOTE) SARS-CoV-2 target nucleic acids are NOT DETECTED.  The SARS-CoV-2 RNA is generally detectable in upper respiratory specimens during the acute phase of infection. The lowest concentration of SARS-CoV-2 viral copies this assay can detect is 138 copies/mL. A negative result does not preclude SARS-Cov-2 infection and should not be used as the sole basis for treatment or other patient management decisions. A negative result may occur with  improper specimen collection/handling, submission of specimen other than nasopharyngeal swab, presence of viral mutation(s) within the areas targeted by this assay, and inadequate number of viral copies(<138 copies/mL). A negative result must be combined with clinical observations, patient history, and epidemiological information. The expected result is Negative.  Fact Sheet for Patients:  EntrepreneurPulse.com.au  Fact Sheet for Healthcare Providers:  IncredibleEmployment.be  This test is no t yet approved or cleared by the [place] FDA and  has been authorized for detection and/or diagnosis of SARS-CoV-2 by FDA under an Emergency Use Authorization (EUA). This EUA will remain  in effect (meaning this test can be used) for the duration of the COVID-19 declaration under Section 564(b)(1) of the Act, 21 U.S.C.section 360bbb-3(b)(1), unless the authorization is terminated  or revoked sooner.       Influenza A by PCR NEGATIVE NEGATIVE   Influenza B by PCR NEGATIVE NEGATIVE    Comment: (NOTE) The Xpert Xpress SARS-CoV-2/FLU/RSV plus assay is intended as an aid in the diagnosis of influenza from  Nasopharyngeal swab specimens and should not be used as a sole basis for treatment. Nasal washings and aspirates are unacceptable for Xpert Xpress SARS-CoV-2/FLU/RSV testing.  Fact Sheet for Patients: EntrepreneurPulse.com.au  Fact Sheet for Healthcare Providers: IncredibleEmployment.be  This test is not yet approved or cleared by the [place] FDA and has been authorized for detection and/or diagnosis of SARS-CoV-2 by FDA under an Emergency Use Authorization (EUA). This EUA will remain in effect (meaning this test can be used) for the duration of the COVID-19 declaration under Section 564(b)(1) of the Act, 21 U.S.C. section 360bbb-3(b)(1), unless the authorization is terminated or  revoked.  Performed at [facility], [place] [address], [place], [place] [address]   CBC with Differential     Status: Abnormal   Collection Time: 05/27/21  9:43 AM  Result Value Ref Range   WBC 5.3 4.0 - 10.5 K/uL   RBC 3.20 (L) 4.22 - 5.81 MIL/uL   Hemoglobin 11.4 (L) 13.0 - 17.0 g/dL   HCT 33.3 (L) 39.0 - 52.0 %   MCV 104.1 (H) 80.0 - 100.0 fL   MCH 35.6 (H) 26.0 - 34.0 pg   MCHC 34.2 30.0 - 36.0 g/dL   RDW 12.6 11.5 - 15.5 %   Platelets 147 (L) 150 - 400 K/uL   nRBC 0.0 0.0 - 0.2 %   Neutrophils Relative % 71 %   Neutro Abs 3.8 1.7 - 7.7 K/uL   Lymphocytes Relative 18 %   Lymphs Abs 0.9 0.7 - 4.0 K/uL   Monocytes Relative 8 %   Monocytes Absolute 0.4 0.1 - 1.0 K/uL   Eosinophils Relative 2 %   Eosinophils Absolute 0.1 0.0 - 0.5 K/uL   Basophils Relative 0 %   Basophils Absolute 0.0 0.0 - 0.1 K/uL   Immature Granulocytes 1 %   Abs Immature Granulocytes 0.03 0.00 - 0.07 K/uL    Comment: Performed at [facility], [address], [place], [place] [address]  Comprehensive metabolic panel     Status: Abnormal   Collection Time: 05/27/21  9:43 AM  Result Value Ref Range   Sodium 139 135 - 145 mmol/L   Potassium 4.3 3.5 - 5.1 mmol/L    Chloride 103 98 - 111 mmol/L   CO2 23 22 - 32 mmol/L   Glucose, Bld 95 70 - 99 mg/dL    Comment: Glucose reference range applies only to samples taken after fasting for at least 8 hours.   BUN 64 (H) 6 - 20 mg/dL   Creatinine, Ser 6.80 (H) 0.61 - 1.24 mg/dL   Calcium 9.0 8.9 - 10.3 mg/dL   Total Protein 6.7 6.5 - 8.1 g/dL   Albumin 3.2 (L) 3.5 - 5.0 g/dL   AST 17 15 - 41 U/L   ALT 11 0 - 44 U/L   Alkaline Phosphatase 48 38 - 126 U/L   Total Bilirubin 0.6 0.3 - 1.2 mg/dL   GFR, Estimated 9 (L) >60 mL/min    Comment: (NOTE) Calculated using the CKD-EPI Creatinine Equation (2021)    Anion gap 13 5 - 15    Comment: Performed at [facility], [place] [address] [place], [place] [address]  CBG monitoring, ED     Status: Abnormal   Collection Time: 05/27/21  3:14 PM  Result Value Ref Range   Glucose-Capillary 100 (H) 70 - 99 mg/dL    Comment: Glucose reference range applies only to samples taken after fasting for at least 8 hours.   Comment 1 Notify RN    Comment 2 Document in Chart   CBG monitoring, ED     Status: Abnormal   Collection Time: 05/27/21 11:44 PM  Result Value Ref Range   Glucose-Capillary 112 (H) 70 - 99 mg/dL    Comment: Glucose reference range applies only to samples taken after fasting for at least 8 hours.  CBC with Differential/Platelet     Status: Abnormal   Collection Time: 05/28/21  1:34 AM  Result Value Ref Range   WBC 4.6 4.0 - 10.5 K/uL   RBC 2.97 (L) 4.22 - 5.81 MIL/uL  Hemoglobin 10.6 (L) 13.0 - 17.0 g/dL   HCT 31.8 (L) 39.0 - 52.0 %   MCV 107.1 (H) 80.0 - 100.0 fL   MCH 35.7 (H) 26.0 - 34.0 pg   MCHC 33.3 30.0 - 36.0 g/dL   RDW 12.7 11.5 - 15.5 %   Platelets 129 (L) 150 - 400 K/uL    Comment: Immature Platelet Fraction may be clinically indicated, consider ordering this additional test [id number] REPEATED TO VERIFY    nRBC 0.0 0.0 - 0.2 %   Neutrophils Relative % 58 %   Neutro Abs 2.6 1.7 - 7.7 K/uL   Lymphocytes Relative 29 %   Lymphs Abs  1.3 0.7 - 4.0 K/uL   Monocytes Relative 11 %   Monocytes Absolute 0.5 0.1 - 1.0 K/uL   Eosinophils Relative 2 %   Eosinophils Absolute 0.1 0.0 - 0.5 K/uL   Basophils Relative 0 %   Basophils Absolute 0.0 0.0 - 0.1 K/uL   Immature Granulocytes 0 %   Abs Immature Granulocytes 0.01 0.00 - 0.07 K/uL    Comment: Performed at [facility], [address] , [place] [address]  Comprehensive metabolic panel     Status: Abnormal   Collection Time: 05/28/21  1:34 AM  Result Value Ref Range   Sodium 135 135 - 145 mmol/L   Potassium 3.8 3.5 - 5.1 mmol/L   Chloride 104 98 - 111 mmol/L   CO2 22 22 - 32 mmol/L   Glucose, Bld 161 (H) 70 - 99 mg/dL    Comment: Glucose reference range applies only to samples taken after fasting for at least 8 hours.   BUN 62 (H) 6 - 20 mg/dL   Creatinine, Ser 6.45 (H) 0.61 - 1.24 mg/dL   Calcium 8.8 (L) 8.9 - 10.3 mg/dL   Total Protein 6.0 (L) 6.5 - 8.1 g/dL   Albumin 2.9 (L) 3.5 - 5.0 g/dL   AST 16 15 - 41 U/L   ALT 11 0 - 44 U/L   Alkaline Phosphatase 46 38 - 126 U/L   Total Bilirubin 0.8 0.3 - 1.2 mg/dL   GFR, Estimated 10 (L) >60 mL/min    Comment: (NOTE) Calculated using the CKD-EPI Creatinine Equation (2021)    Anion gap 9 5 - 15    Comment: Performed at [facility], [place] [address] [place], [place] [address]  Magnesium     Status: None   Collection Time: 05/28/21  1:34 AM  Result Value Ref Range   Magnesium 2.3 1.7 - 2.4 mg/dL    Comment: Performed at [facility] [address], [place], [place] [address]  HIV Antibody (routine testing w rflx)     Status: None   Collection Time: 05/28/21  1:34 AM  Result Value Ref Range   HIV Screen 4th Generation wRfx Non Reactive Non Reactive    Comment: Performed at [facility], [place] [address] [place], [place] [address]    DG Chest 2 View  Result Date: 05/27/2021 CLINICAL DATA:  Shortness of breath. EXAM: CHEST - 2 VIEW COMPARISON:  01/02/2021 FINDINGS: Both lungs are  clear. Heart and mediastinum are within normal limits. Trachea is midline. No pleural effusions. Negative for a pneumothorax. IMPRESSION: No active cardiopulmonary disease. Electronically Signed   By: Markus Daft M.D.   On: 05/27/2021 10:31    Assessment/Plan **clotted AVG: IR consulted for thrombectomy which is planned tomorrow AM.  **ESRD on HD:  will have dialysis following  thrombectomy and potentially can d/c following.  Can clarify outpt orders in the AM.  Renal diet with fluid restrictions.    **Anemia of ESRD:  Hb in 11s  **HTN:  resume home meds  **DM: insulin per primary.  Justin Mend 05/28/2021, 6:38 AM

## 2021-05-28 NOTE — Consult Note (Signed)
Chief Complaint: Patient was seen in consultation today for left arm dialysis graft declot at the request of Pati Gallo S  Referring Physician(s): Tedd Sias  Supervising Physician: Markus Daft  Patient Status: [facility] - In-pt  History of Present Illness: Timothy Clark is a 50 y.o. male   ESRD Left dialysis graft - 03/21/20 Last use Wed of last week-- ran fine Friday noted slow flow and clots Monday went to [facility] for eval and declot--- but was vomitting and was sent to ED  Admitted now  And for declot in IR today Last IR intervention on this graft was 09/19/20   Past Medical History:  Diagnosis Date   Anemia    Anxiety    CHF (congestive heart failure) (HCC)    diastolic dysfunction   Chronic abdominal pain    COVID-19 07/2019   Depression    Esophagitis    ESRD on hemodialysis ([place])    undergoing evaluation for transplant   Eye hemorrhage    Gastritis    Gastroparesis    GERD (gastroesophageal reflux disease)    Glaucoma    Helicobacter pylori (H. pylori) infection 2016   ERADICATION DOCUMENTED VIA STOOL TEST in AUG 2016 at [facility]   Hiccups    Hypertension    Nausea and vomiting    chronic, recurrent   Neuropathy    feet   Renal insufficiency    Type 2 diabetes mellitus with complications ([place])    diagnosed around age 48   Vitreous hemorrhage of left eye ([place]) 12/15/2019    Past Surgical History:  Procedure Laterality Date   AMPUTATION TOE Right    great toe   AV FISTULA INSERTION W/ RF MAGNETIC GUIDANCE Left 04/06/2019   Procedure: AV FISTULA INSERTION W/RF MAGNETIC GUIDANCE;  Surgeon: Katha Cabal, MD;  Location: [place] [facility];  Service: Cardiovascular;  Laterality: Left;   AV FISTULA PLACEMENT Left 05/06/2019   Procedure: INSERTION OF ARTERIOVENOUS (AV) GORE-TEX GRAFT ARM ( FOREARM LOOP );  Surgeon: Katha Cabal, MD;  Location: [facility];  Service: Vascular;  Laterality: Left;   AV FISTULA PLACEMENT Left 03/21/2020    Procedure: INSERTION OF ARTERIOVENOUS (AV) GORE-TEX GRAFT ARM ( BRACHIAL AXILLARY );  Surgeon: Katha Cabal, MD;  Location: [facility];  Service: Vascular;  Laterality: Left;   CATARACT EXTRACTION W/PHACO Left 05/19/2016   Procedure: CATARACT EXTRACTION PHACO AND INTRAOCULAR LENS PLACEMENT LEFT EYE;  Surgeon: Tonny Branch, MD;  Location: [facility];  Service: Ophthalmology;  Laterality: Left;  CDE: 7.30   CATARACT EXTRACTION W/PHACO Right 06/23/2016   Procedure: CATARACT EXTRACTION PHACO AND INTRAOCULAR LENS PLACEMENT RIGHT EYE CDE=9.87;  Surgeon: Tonny Branch, MD;  Location: [facility];  Service: Ophthalmology;  Laterality: Right;  right   DIALYSIS/PERMA CATHETER REMOVAL N/A 07/19/2019   Procedure: DIALYSIS/PERMA CATHETER REMOVAL;  Surgeon: Katha Cabal, MD;  Location: [place] [facility];  Service: Cardiovascular;  Laterality: N/A;   ESOPHAGOGASTRODUODENOSCOPY  2015   Dr. Britta Mccreedy   ESOPHAGOGASTRODUODENOSCOPY N/A 10/26/2014   RMR: Distal esophagititis-likely reflux related although an element of pill induced injuruy no exclueded  status post biopsy. Diffusely abnormal gastric mucosa of uncertain signigicane -status post gastric biopsy. Focal area of excoriation in the cardia most consistant with a trauma of heaving.    ESOPHAGOGASTRODUODENOSCOPY  08/2015   [facility]: mild esophagitis and old gastric contents   EYE SURGERY Left 2015   stent placed to left eye   EYE SURGERY Right 06/2019   per patient-  to remove scar tissue   INCISION AND DRAINAGE OF WOUND Right 07/16/2017   Procedure: IRRIGATION AND DEBRIDEMENT OF SOFT TISSUE OF ULCERATION RIGHT FOOT;  Surgeon: Caprice Beaver, DPM;  Location: [facility];  Service: Podiatry;  Laterality: Right;   IR THROMBECTOMY AV FISTULA W/THROMBOLYSIS/PTA INC/SHUNT/IMG LEFT Left 09/19/2020   IR US GUIDE VASC ACCESS LEFT  09/19/2020   PERIPHERAL VASCULAR THROMBECTOMY Left 09/27/2019   Procedure: PERIPHERAL VASCULAR THROMBECTOMY;  Surgeon: Katha Cabal, MD;  Location:  [place] [facility];  Service: Cardiovascular;  Laterality: Left;   PERIPHERAL VASCULAR THROMBECTOMY Left 10/27/2019   Procedure: PERIPHERAL VASCULAR THROMBECTOMY;  Surgeon: Algernon Huxley, MD;  Location: [place] [facility];  Service: Cardiovascular;  Laterality: Left;   PERIPHERAL VASCULAR THROMBECTOMY Left 12/13/2019   Procedure: PERIPHERAL VASCULAR THROMBECTOMY;  Surgeon: Katha Cabal, MD;  Location: [place] [facility];  Service: Cardiovascular;  Laterality: Left;    Allergies: Donnatal [phenobarbital-belladonna alk], Fish allergy, Haldol [haloperidol lactate], Lidocaine, Maalox [calcium carbonate antacid], Shellfish allergy, Aspirin, Doxycycline, Keflex [cephalexin], Marinol [dronabinol], Other, Penicillins, and Bactrim [sulfamethoxazole-trimethoprim]  Medications: Prior to Admission medications   Medication Sig Start Date End Date Taking? Authorizing Provider  ALPRAZolam Duanne Moron) 0.5 MG tablet Take 0.5 mg by mouth 2 (two) times daily as needed for anxiety.    [provider]  amLODipine (NORVASC) 5 MG tablet Take 5 mg by mouth daily.    [provider]  atorvastatin (LIPITOR) 10 MG tablet Take 1 tablet (10 mg total) by mouth at bedtime. 10/13/20 05/27/21  Johnson, Clanford L, MD  brimonidine (ALPHAGAN) 0.15 % ophthalmic solution Place 1 drop into both eyes in the morning and at bedtime.     [provider]  dicyclomine (BENTYL) 20 MG tablet Take 1 tablet (20 mg total) by mouth 2 (two) times daily. Patient taking differently: Take 20 mg by mouth 2 (two) times daily as needed for spasms. 05/12/21   Jacqlyn Larsen, PA-C  divalproex (DEPAKOTE) 250 MG DR tablet Take 750 mg by mouth 3 (three) times daily.    [provider]  dorzolamide-timolol (COSOPT) 22.3-6.8 MG/ML ophthalmic solution Place 1 drop into both eyes 2 (two) times daily.  01/16/20   [provider]  famotidine (PEPCID) 20 MG tablet Take 20 mg by mouth 2 (two) times daily.    [provider]  folic acid (FOLVITE) 1 MG tablet Take 1 mg by mouth daily.    [provider]  gentamicin cream (GARAMYCIN) 0.1 % Apply 1 application topically 2 (two) times daily. 12/12/20   Edrick Kins, DPM  Glucagon (GVOKE HYPOPEN 2-PACK) 1 MG/0.2ML SOAJ Inject 1 Syringe into the skin once as needed (hypoglycemia).    [provider]  Iron-FA-B Cmp-C-Biot-Probiotic (FUSION PLUS PO) Take 1 capsule by mouth daily.    [provider]  LEADER UNIFINE PENTIPS PLUS 32G X 4 MM MISC  05/17/20   [provider]  metoCLOPramide (REGLAN) 10 MG tablet Take 1 tablet (10 mg total) by mouth every 6 (six) hours. 05/12/21   Jacqlyn Larsen, PA-C  NOVOLOG MIX 70/30 FLEXPEN (70-30) 100 UNIT/ML FlexPen Inject 25-35 Units into the skin See admin instructions. Inject 35 units in the morning and 25 every evening - Sliding scale 02/06/21   [provider]  ondansetron (ZOFRAN-ODT) 4 MG disintegrating tablet Take 4 mg by mouth every 8 (eight) hours as needed for nausea or vomiting.    [provider]  oxyCODONE-acetaminophen (PERCOCET) 5-325 MG tablet Take  1 tablet by mouth every 6 (six) hours as needed. Patient not taking: Reported on 05/27/2021 04/07/2021   Milton Ferguson, MD  pantoprazole (PROTONIX) 40 MG tablet Take 1 tablet (40 mg total) by mouth 2 (two) times daily before a meal. 08/24/18   Annitta Needs, NP  polyethylene glycol (MIRALAX / GLYCOLAX) 17 g packet Take 17 g by mouth daily as needed for mild constipation. Patient taking differently: Take 17 g by mouth daily as needed for moderate constipation. 10/13/20   Johnson, Clanford L, MD  senna-docusate (SENOKOT-S) 8.6-50 MG tablet Take 1 tablet by mouth 2 (two) times daily. Patient not taking: No sig reported 10/13/20   Wynetta Emery, Clanford L, MD  sitaGLIPtin (JANUVIA) 25 MG tablet Take 25 mg by mouth daily.     [provider]  sucralfate (CARAFATE) 1 g tablet Take 1 g by mouth 4 (four) times daily.    [provider]  torsemide (DEMADEX) 20 MG tablet Take 40 mg by mouth daily. At noon    [provider]  ziprasidone (GEODON) 20 MG capsule Take 1 tab with breakfast and 2 tabs at HS Patient taking differently: Take 20-40 mg by mouth See admin instructions. Take 20 mg capsule with breakfast and 40 mg at bedtime 10/13/20   Murlean Iba, MD     Family History  Problem Relation Age of Onset   Ovarian cancer Mother    Heart attack Father    Colon polyps Father    Cervical cancer Sister    Diabetes Sister    Colon cancer Neg Hx     Social History   Socioeconomic History   Marital status: Single    Spouse name: Not on file   Number of children: Not on file   Years of education: Not on file   Highest education level: Not on file  Occupational History   Not on file  Tobacco Use   Smoking status: Never   Smokeless tobacco: Never  Vaping Use   Vaping Use: Never used  Substance and Sexual Activity   Alcohol use: No    Alcohol/week: 0.0 standard drinks   Drug use: No   Sexual activity: Yes  Other Topics Concern   Not on file  Social History Narrative   Lives with dad and sister   Social Determinants of Health   Financial Resource Strain: Not on file  Food Insecurity: Not on file  Transportation Needs: Not on file  Physical Activity: Not on file  Stress: Not on file  Social Connections: Not on file    Review of Systems: A 12 point ROS discussed and pertinent positives are indicated in the HPI above.  All other systems are negative.  Review of Systems  Constitutional:  Positive for activity change and fatigue. Negative for fever.  Respiratory:  Positive for cough. Negative for shortness of breath.   Cardiovascular:  Negative for chest pain.  Gastrointestinal:  Positive for nausea and vomiting.  Psychiatric/Behavioral:  Negative for behavioral problems and confusion.    Vital Signs: There were no vitals taken for this visit.  Physical Exam Vitals  reviewed.  Constitutional:      Comments: Pt is blind Verbal consent was obtained  HENT:     Mouth/Throat:     Mouth: Mucous membranes are moist.  Cardiovascular:     Rate and Rhythm: Normal rate and regular rhythm.     Heart sounds: Normal heart sounds.  Pulmonary:     Breath sounds: Normal breath  sounds.  Abdominal:     Palpations: Abdomen is soft.  Musculoskeletal:     Comments: LUE no swelling Graft with no thrill or bruit  Skin:    General: Skin is warm.  Neurological:     Mental Status: He is alert and oriented to person, place, and time.  Psychiatric:        Behavior: Behavior normal.    Imaging: CT ABDOMEN PELVIS WO CONTRAST  Result Date: 05/04/2021 CLINICAL DATA:  Epigastric pain vomiting EXAM: CT ABDOMEN AND PELVIS WITHOUT CONTRAST TECHNIQUE: Multidetector CT imaging of the abdomen and pelvis was performed following the standard protocol without IV contrast. COMPARISON:  CT 04/08/2021, 09/20/2020 FINDINGS: Lower chest: Lung bases demonstrate no acute consolidation or effusion. Normal cardiac size. Hepatobiliary: No focal hepatic abnormality. No biliary dilatation. No calcified stone Pancreas: Unremarkable. No pancreatic ductal dilatation or surrounding inflammatory changes. Spleen: Normal in size without focal abnormality. Adrenals/Urinary Tract: Adrenal glands are normal. Mild bilateral renal atrophy. No hydronephrosis. The bladder is unremarkable. Stomach/Bowel: Stomach is within normal limits. Appendix appears normal. No evidence of bowel wall thickening, distention, or inflammatory changes. Vascular/Lymphatic: Nonaneurysmal aorta. Mild atherosclerosis. No suspicious nodes Reproductive: Prostate is unremarkable. Other: Negative for free air or free fluid. Fat containing right inguinal hernia Musculoskeletal: Degenerative changes. No acute osseous abnormality. Chronic right-sided rib fractures. IMPRESSION: 1. No CT evidence for acute intra-abdominal or pelvic abnormality.  Electronically Signed   By: Donavan Foil M.D.   On: 05/04/2021 16:19   DG Chest 2 View  Result Date: 05/27/2021 CLINICAL DATA:  Shortness of breath. EXAM: CHEST - 2 VIEW COMPARISON:  01/02/2021 FINDINGS: Both lungs are clear. Heart and mediastinum are within normal limits. Trachea is midline. No pleural effusions. Negative for a pneumothorax. IMPRESSION: No active cardiopulmonary disease. Electronically Signed   By: Markus Daft M.D.   On: 05/27/2021 10:31   DG Foot Complete Left  Result Date: 04/30/2021 Please see detailed radiograph report in office note.   Labs:  CBC: Recent Labs    05/04/21 1438 05/12/21 1132 05/27/21 0943 05/28/21 0134  WBC 5.3 5.3 5.3 4.6  HGB 11.3* 11.3* 11.4* 10.6*  HCT 32.8* 33.6* 33.3* 31.8*  PLT 146* 129* 147* 129*    COAGS: No results for input(s): INR, APTT in the last 8760 hours.  BMP: Recent Labs    05/04/21 1438 05/12/21 1132 05/27/21 0943 05/28/21 0134  NA 138 134* 139 135  K 4.0 3.7 4.3 3.8  CL 104 100 103 104  CO2 '24 22 23 22  ' GLUCOSE 108* 154* 95 161*  BUN 55* 43* 64* 62*  CALCIUM 9.3 9.0 9.0 8.8*  CREATININE 5.72* 5.84* 6.80* 6.45*  GFRNONAA 11* 11* 9* 10*    LIVER FUNCTION TESTS: Recent Labs    05/04/21 1438 05/12/21 1132 05/27/21 0943 05/28/21 0134  BILITOT 0.6 1.0 0.6 0.8  AST '22 25 17 16  ' ALT '15 16 11 11  ' ALKPHOS 58 55 48 46  PROT 7.5 7.4 6.7 6.0*  ALBUMIN 3.6 3.7 3.2* 2.9*    TUMOR MARKERS: No results for input(s): AFPTM, CEA, CA199, CHROMGRNA in the last 8760 hours.  Assessment and Plan:  ESRD Clotted LUE dialysis graft For intervention in IR today- possible tunneled dialysis catheter placement Risks and benefits discussed with the patient including, but not limited to bleeding, infection, vascular injury, pulmonary embolism, need for tunneled HD catheter placement or even death.  All of the patient's questions were answered, patient is agreeable to proceed. Consent signed and in  chart.    Thank you  for this interesting consult.  I greatly enjoyed meeting Timothy Clark and look forward to participating in their care.  A copy of this report was sent to the requesting provider on this date.  Electronically Signed: Lavonia Drafts, PA-C 05/28/2021, 8:18 AM   I spent a total of 20 Minutes   in face to face in clinical consultation, greater than 50% of which was counseling/coordinating care for LUE dialysis graft declot with poss intervention

## 2021-05-29 ENCOUNTER — Ambulatory Visit: Payer: Medicare HMO | Admitting: Podiatry

## 2021-05-29 DIAGNOSIS — Z992 Dependence on renal dialysis: Secondary | ICD-10-CM | POA: Diagnosis not present

## 2021-05-29 DIAGNOSIS — N186 End stage renal disease: Secondary | ICD-10-CM | POA: Diagnosis not present

## 2021-05-29 LAB — HEPATITIS B SURFACE ANTIBODY, QUANTITATIVE: Hep B S AB Quant (Post): 26.1 m[IU]/mL (ref >9.9)

## 2021-05-29 NOTE — Progress Notes (Signed)
Late Entry Note:  Pt was d/c to home yesterday. Spoke to [place] at [employer] this am to provide update regarding d/c yesterday and to resume today. Per Joy, d/c summary does not need to be faxed. MD can access.   Melven Sartorius Renal Navigator [phone]

## 2021-05-30 ENCOUNTER — Encounter (INDEPENDENT_AMBULATORY_CARE_PROVIDER_SITE_OTHER): Payer: Self-pay | Admitting: Ophthalmology

## 2021-05-30 ENCOUNTER — Ambulatory Visit (INDEPENDENT_AMBULATORY_CARE_PROVIDER_SITE_OTHER): Payer: Medicare HMO | Admitting: Ophthalmology

## 2021-05-30 ENCOUNTER — Other Ambulatory Visit: Payer: Self-pay

## 2021-05-30 DIAGNOSIS — H4720 Unspecified optic atrophy: Secondary | ICD-10-CM | POA: Diagnosis not present

## 2021-05-30 DIAGNOSIS — E113512 Type 2 diabetes mellitus with proliferative diabetic retinopathy with macular edema, left eye: Secondary | ICD-10-CM | POA: Diagnosis not present

## 2021-05-30 DIAGNOSIS — E113552 Type 2 diabetes mellitus with stable proliferative diabetic retinopathy, left eye: Secondary | ICD-10-CM | POA: Diagnosis not present

## 2021-05-30 DIAGNOSIS — E113551 Type 2 diabetes mellitus with stable proliferative diabetic retinopathy, right eye: Secondary | ICD-10-CM

## 2021-05-30 DIAGNOSIS — R0683 Snoring: Secondary | ICD-10-CM

## 2021-05-30 DIAGNOSIS — H401133 Primary open-angle glaucoma, bilateral, severe stage: Secondary | ICD-10-CM | POA: Insufficient documentation

## 2021-05-30 NOTE — Assessment & Plan Note (Signed)
Patient told in the recent hospital visit that likely the monitoring and night disclosed he may very well have sleep apnea.  No formal testing was performed nor has any been scheduled  I do recommend patient commence with his PCP for formal testing because of the critical importance of preventing nightly hypoxia that is low oxygen to the retina and to the optic nerve.  I explained the patient that this can be the most critical factor in preventing his continued visual loss due to nightly hypoxia occurring with untreated sleep apnea which could be present

## 2021-05-30 NOTE — Assessment & Plan Note (Signed)
No signs of progression of diabetic retinopathy quiet PDR

## 2021-05-30 NOTE — Progress Notes (Signed)
  05/30/2021     CHIEF COMPLAINT Patient presents for  Chief Complaint  Patient presents with   Retina Follow Up      HISTORY OF PRESENT ILLNESS: Timothy Clark is a 50 y.o. male who presents to the clinic today for:   HPI     Retina Follow Up   Patient presents with  Diabetic Retinopathy.  In both eyes.  This started 6 months ago.  Severity is mild.  Duration of 6 months.  Since onset it is gradually worsening.        Comments   6 month fu ou and fp Pt states, "My vision seems to be terrible. It is darker and it just overall seems worse.' Pt reports using Combigan and Dorzolamide BID OU and a "white top" BID OU A1C:5.2 LBS:079  Patient reports being referred to [facility] in [place] for potential glaucoma evaluation and potentially another aqueous bypass shunt to be placed to control the progression of his low-tension glaucoma and vision loss attributable to progressive optic atrophy.  Sooner than reviewed and I asked the patient given the body habitus in front of me, does he have sleep apnea.   He says he is never been treated For it and has never been formally diagnosed but did relate that in the hospital they told him that he likely has sleep apnea Due to their monitoring at that time.  He never followed up with his PCP for formal test ing         Last edited by ,  A, MD on 05/30/2021  9:58 AM.      Referring physician: Hall, John Z, MD [address] [place],  [place] [address]  HISTORICAL INFORMATION:   Selected notes from the medical record:     Lab Results  Component Value Date   HGBA1C 5.0 04/06/2021     CURRENT MEDICATIONS: Current Outpatient Medications (Ophthalmic Drugs)  Medication Sig   brimonidine (ALPHAGAN) 0.15 % ophthalmic solution Place 1 drop into both eyes in the morning and at bedtime.    dorzolamide-timolol (COSOPT) 22.3-6.8 MG/ML ophthalmic solution Place 1 drop into both eyes 2 (two) times daily.     No current facility-administered medications for this visit. (Ophthalmic Drugs)   Current Outpatient Medications (Other)  Medication Sig   ALPRAZolam (XANAX) 0.5 MG tablet Take 0.5 mg by mouth 2 (two) times daily as needed for anxiety.   amLODipine (NORVASC) 5 MG tablet Take 5 mg by mouth daily.   atorvastatin (LIPITOR) 10 MG tablet Take 1 tablet (10 mg total) by mouth at bedtime.   dicyclomine (BENTYL) 20 MG tablet Take 1 tablet (20 mg total) by mouth 2 (two) times daily. (Patient taking differently: Take 20 mg by mouth 2 (two) times daily as needed for spasms.)   divalproex (DEPAKOTE) 250 MG DR tablet Take 750 mg by mouth 3 (three) times daily.   famotidine (PEPCID) 20 MG tablet Take 1 tablet (20 mg total) by mouth daily.   folic acid (FOLVITE) 1 MG tablet Take 1 mg by mouth daily.   gentamicin cream (GARAMYCIN) 0.1 % Apply 1 application topically 2 (two) times daily.   Glucagon (GVOKE HYPOPEN 2-PACK) 1 MG/0.2ML SOAJ Inject 1 Syringe into the skin once as needed (hypoglycemia).   Iron-FA-B Cmp-C-Biot-Probiotic (FUSION PLUS PO) Take 1 capsule by mouth daily.   LEADER UNIFINE PENTIPS PLUS 32G X 4 MM MISC    metoCLOPramide (REGLAN) 10 MG tablet Take 1 tablet (10 mg total)   by mouth every 6 (six) hours.   NOVOLOG MIX 70/30 FLEXPEN (70-30) 100 UNIT/ML FlexPen Inject 25-35 Units into the skin See admin instructions. Inject 35 units in the morning and 25 every evening - Sliding scale   ondansetron (ZOFRAN-ODT) 4 MG disintegrating tablet Take 4 mg by mouth every 8 (eight) hours as needed for nausea or vomiting.   polyethylene glycol (MIRALAX / GLYCOLAX) 17 g packet Take 17 g by mouth daily as needed for mild constipation. (Patient taking differently: Take 17 g by mouth daily as needed for moderate constipation.)   senna-docusate (SENOKOT-S) 8.6-50 MG tablet Take 1 tablet by mouth 2 (two) times daily. (Patient not taking: No sig reported)   sitaGLIPtin (JANUVIA) 25 MG tablet Take 25 mg by mouth  daily.    sucralfate (CARAFATE) 1 g tablet Take 1 g by mouth 4 (four) times daily.   torsemide (DEMADEX) 20 MG tablet Take 40 mg by mouth daily. At noon   ziprasidone (GEODON) 20 MG capsule Take 1 tab with breakfast and 2 tabs at HS (Patient taking differently: Take 20-40 mg by mouth See admin instructions. Take 20 mg capsule with breakfast and 40 mg at bedtime)   No current facility-administered medications for this visit. (Other)      REVIEW OF SYSTEMS: ROS   Positive for: Respiratory Last edited by Hurman Horn, MD on 05/30/2021  9:58 AM.       ALLERGIES Allergies  Allergen Reactions   Donnatal [Phenobarbital-Belladonna Alk] Anaphylaxis and Other (See Comments)    Reaction to GI cocktail   Fish Allergy Anaphylaxis, Hives and Rash   Haldol [Haloperidol Lactate] Other (See Comments)    Chest Pain   Lidocaine Anaphylaxis and Other (See Comments)    Reaction to GI cocktail   Maalox [Calcium Carbonate Antacid] Anaphylaxis and Other (See Comments)    Reaction to GI cocktail   Shellfish Allergy Hives    Per patient: Patient stated he breaks out in hives when eating shellfish   Aspirin Hives and Itching   Doxycycline Hives, Nausea And Vomiting and Other (See Comments)    Severe    Keflex [Cephalexin] Hives, Itching and Nausea And Vomiting   Marinol [Dronabinol] Nausea And Vomiting and Other (See Comments)    Caused worsening vomiting.    Other Swelling   Penicillins Hives, Itching, Other (See Comments) and Rash    Has patient had a PCN reaction causing immediate rash, facial/tongue/throat swelling, SOB or lightheadedness with hypotension: yes Has patient had a PCN reaction causing severe rash involving mucus membranes or skin necrosis: yes Has patient had a PCN reaction that required hospitalization no Has patient had a PCN reaction occurring within the last 10 years: yes If all of the above answers are "NO", then may proceed with Cephalospor   Bactrim  [Sulfamethoxazole-Trimethoprim] Itching    PAST MEDICAL HISTORY Past Medical History:  Diagnosis Date   Anemia    Anxiety    CHF (congestive heart failure) (HCC)    diastolic dysfunction   Chronic abdominal pain    COVID-19 07/2019   Depression    Esophagitis    ESRD on hemodialysis ([place])    undergoing evaluation for transplant   Eye hemorrhage    Gastritis    Gastroparesis    GERD (gastroesophageal reflux disease)    Glaucoma    Helicobacter pylori (H. pylori) infection 2016   ERADICATION DOCUMENTED VIA STOOL TEST in AUG 2016 at [facility]   Hiccups    Hypertension  Nausea and vomiting    chronic, recurrent   Neuropathy    feet   Renal insufficiency    Type 2 diabetes mellitus with complications ([place])    diagnosed around age 25   Vitreous hemorrhage of left eye ([place]) 12/15/2019   Past Surgical History:  Procedure Laterality Date   AMPUTATION TOE Right    great toe   AV FISTULA INSERTION W/ RF MAGNETIC GUIDANCE Left 04/06/2019   Procedure: AV FISTULA INSERTION W/RF MAGNETIC GUIDANCE;  Surgeon: Katha Cabal, MD;  Location: [facility];  Service: Cardiovascular;  Laterality: Left;   AV FISTULA PLACEMENT Left 05/06/2019   Procedure: INSERTION OF ARTERIOVENOUS (AV) GORE-TEX GRAFT ARM ( FOREARM LOOP );  Surgeon: Katha Cabal, MD;  Location: [facility];  Service: Vascular;  Laterality: Left;   AV FISTULA PLACEMENT Left 03/21/2020   Procedure: INSERTION OF ARTERIOVENOUS (AV) GORE-TEX GRAFT ARM ( BRACHIAL AXILLARY );  Surgeon: Katha Cabal, MD;  Location: [facility];  Service: Vascular;  Laterality: Left;   CATARACT EXTRACTION W/PHACO Left 05/19/2016   Procedure: CATARACT EXTRACTION PHACO AND INTRAOCULAR LENS PLACEMENT LEFT EYE;  Surgeon: Tonny Branch, MD;  Location: [facility];  Service: Ophthalmology;  Laterality: Left;  CDE: 7.30   CATARACT EXTRACTION W/PHACO Right 06/23/2016   Procedure: CATARACT EXTRACTION PHACO AND INTRAOCULAR LENS PLACEMENT RIGHT EYE CDE=9.87;   Surgeon: Tonny Branch, MD;  Location: [facility];  Service: Ophthalmology;  Laterality: Right;  right   DIALYSIS/PERMA CATHETER REMOVAL N/A 07/19/2019   Procedure: DIALYSIS/PERMA CATHETER REMOVAL;  Surgeon: Katha Cabal, MD;  Location: [facility];  Service: Cardiovascular;  Laterality: N/A;   ESOPHAGOGASTRODUODENOSCOPY  2015   Dr. Britta Mccreedy   ESOPHAGOGASTRODUODENOSCOPY N/A 10/26/2014   RMR: Distal esophagititis-likely reflux related although an element of pill induced injuruy no exclueded  status post biopsy. Diffusely abnormal gastric mucosa of uncertain signigicane -status post gastric biopsy. Focal area of excoriation in the cardia most consistant with a trauma of heaving.    ESOPHAGOGASTRODUODENOSCOPY  08/2015   [facility]: mild esophagitis and old gastric contents   EYE SURGERY Left 2015   stent placed to left eye   EYE SURGERY Right 06/2019   per patient- to remove scar tissue   INCISION AND DRAINAGE OF WOUND Right 07/16/2017   Procedure: IRRIGATION AND DEBRIDEMENT OF SOFT TISSUE OF ULCERATION RIGHT FOOT;  Surgeon: Caprice Beaver, DPM;  Location: [facility];  Service: Podiatry;  Laterality: Right;   IR THROMBECTOMY AV FISTULA W/THROMBOLYSIS/PTA INC/SHUNT/IMG LEFT Left 09/19/2020   IR THROMBECTOMY AV FISTULA W/THROMBOLYSIS/PTA INC/SHUNT/IMG LEFT Left 05/28/2021   IR US GUIDE VASC ACCESS LEFT  09/19/2020   IR US GUIDE VASC ACCESS LEFT  05/28/2021   PERIPHERAL VASCULAR THROMBECTOMY Left 09/27/2019   Procedure: PERIPHERAL VASCULAR THROMBECTOMY;  Surgeon: Katha Cabal, MD;  Location: [facility];  Service: Cardiovascular;  Laterality: Left;   PERIPHERAL VASCULAR THROMBECTOMY Left 10/27/2019   Procedure: PERIPHERAL VASCULAR THROMBECTOMY;  Surgeon: Algernon Huxley, MD;  Location: [facility];  Service: Cardiovascular;  Laterality: Left;   PERIPHERAL VASCULAR THROMBECTOMY Left 12/13/2019   Procedure: PERIPHERAL VASCULAR THROMBECTOMY;  Surgeon: Katha Cabal, MD;  Location:  [facility];  Service: Cardiovascular;  Laterality: Left;    FAMILY HISTORY Family History  Problem Relation Age of Onset   Ovarian cancer Mother    Heart attack Father    Colon polyps Father    Cervical cancer Sister    Diabetes Sister  Colon cancer Neg Hx     SOCIAL HISTORY Social History   Tobacco Use   Smoking status: Never   Smokeless tobacco: Never  Vaping Use   Vaping Use: Never used  Substance Use Topics   Alcohol use: No    Alcohol/week: 0.0 standard drinks   Drug use: No         OPHTHALMIC EXAM:  Base Eye Exam     Visual Acuity (ETDRS)       Right Left   Dist [place] 20/400 20/40   Dist ph [place] NI          Tonometry (Tonopen, 9:18 AM)       Right Left   Pressure 11 14         Pupils       Dark Light Shape React APD   Right 2 2 Irregular Minimal None   Left 2 2 Irregular Minimal None         Visual Fields       Left Right   Restrictions Total superior temporal, inferior temporal, superior nasal, inferior nasal deficiencies Total superior temporal, inferior temporal, superior nasal, inferior nasal deficiencies         Neuro/Psych     Oriented x3: Yes   Mood/Affect: Normal         Dilation     Both eyes: 1.0% Mydriacyl, 2.5% Phenylephrine @ 9:18 AM           Slit Lamp and Fundus Exam     External Exam       Right Left   External Normal Normal         Slit Lamp Exam       Right Left   Lids/Lashes Normal Normal   Conjunctiva/Sclera White and quiet, no exposures of external tube to footplate of glaucoma shunt White and quiet,, good coverage of tube   Cornea Clear Clear   Anterior Chamber Tube shunt at 10 o'clock Tube shunt enters at 2 o'clock position into anterior chamber   Iris Round and reactive Round and reactive   Lens Centered posterior chamber intraocular lens Posterior chamber intraocular lens   Anterior Vitreous Normal No cells         Fundus Exam       Right Left   Posterior Vitreous  Pre-retinal hemorrhage, small nasal Clear   Disc 1+ Pallor 3+ Pallor,  stable   C/D Ratio 0.75 0.75   Macula No active maculopathy Attached, no active maculopathy   Vessels PDR-quiet Proliferative diabetic retinopathy, no obvious new neovascularization, PDR-quiet   Periphery Good PRP, nearly wall-to-wall laser Completely attached with good PRP moderate haze            IMAGING AND PROCEDURES  Imaging and Procedures for 05/30/21  Color Fundus Photography Optos - OU - Both Eyes       Right Eye Progression has been stable. Disc findings include increased cup to disc ratio, pallor. Macula : microaneurysms.   Left Eye Progression has been stable. Disc findings include increased cup to disc ratio, pallor. Macula : microaneurysms.   Notes Quiescent PDR overall with good clear media, stable condition             ASSESSMENT/PLAN:  Snores Patient told in the recent hospital visit that likely the monitoring and night disclosed he may very well have sleep apnea.  No formal testing was performed nor has any been scheduled  I do recommend patient commence with his PCP for formal testing  because of the critical importance of preventing nightly hypoxia that is low oxygen to the retina and to the optic nerve.  I explained the patient that this can be the most critical factor in preventing his continued visual loss due to nightly hypoxia occurring with untreated sleep apnea which could be present  Stable treated proliferative diabetic retinopathy of right eye determined by examination associated with type 2 diabetes mellitus (HCC) No signs of progression of proliferative diabetic retinopathy, quiet  Stable treated proliferative diabetic retinopathy of left eye determined by examination associated with type 2 diabetes mellitus (HCC) No signs of progression of diabetic retinopathy quiet PDR  Optic atrophy, left eye Progressive optic atrophy and vision loss.  I do recommend patient have  formal sleep study testing to a recent hospitalization apparent nightly monitoring suggesting to him that he has sleep apnea, per report as he was being discharged.     ICD-10-CM   1. Proliferative diabetic retinopathy of left eye with macular edema associated with type 2 diabetes mellitus (HCC)  E11.3512 Color Fundus Photography Optos - OU - Both Eyes    2. Stable treated proliferative diabetic retinopathy of right eye determined by examination associated with type 2 diabetes mellitus (HCC)  E11.3551 Color Fundus Photography Optos - OU - Both Eyes    3. Optic atrophy, left eye  H47.20 Color Fundus Photography Optos - OU - Both Eyes    4. Snores  R06.83     5. Stable treated proliferative diabetic retinopathy of left eye determined by examination associated with type 2 diabetes mellitus (HCC)  E11.3552     6. Primary open angle glaucoma of both eyes, severe stage  H40.1133       1.  No specific retinal therapy warranted today, quiescent PDR OU  2.  Follow-up with PCP Dr. John Hall for consideration of home sleep study  3.  Follow-up with [facility] as scheduled but also with [facility] [place] for glaucoma evaluation consideration of an additional shunt as reported to me by patient  Ophthalmic Meds Ordered this visit:  No orders of the defined types were placed in this encounter.      Return in about 6 months (around 11/28/2021) for DILATE OU, COLOR FP, OCT.  There are no Patient Instructions on file for this visit.   Explained the diagnoses, plan, and follow up with the patient and they expressed understanding.  Patient expressed understanding of the importance of proper follow up care.   Armida Vickroy A. Myeshia Fojtik M.D. Diseases & Surgery of the Retina and Vitreous [facility] 05/30/21     Abbreviations: M myopia (nearsighted); A astigmatism; H hyperopia (farsighted); P presbyopia; Mrx spectacle prescription;  CTL contact lenses; OD right eye; OS left  eye; OU both eyes  XT exotropia; ET esotropia; PEK punctate epithelial keratitis; PEE punctate epithelial erosions; DES dry eye syndrome; MGD meibomian gland dysfunction; ATs artificial tears; PFAT's preservative free artificial tears; NSC nuclear sclerotic cataract; PSC posterior subcapsular cataract; ERM epi-retinal membrane; PVD posterior vitreous detachment; RD retinal detachment; DM diabetes mellitus; DR diabetic retinopathy; NPDR non-proliferative diabetic retinopathy; PDR proliferative diabetic retinopathy; CSME clinically significant macular edema; DME diabetic macular edema; dbh dot blot hemorrhages; CWS cotton wool spot; POAG primary open angle glaucoma; C/D cup-to-disc ratio; HVF humphrey visual field; GVF goldmann visual field; OCT optical coherence tomography; IOP intraocular pressure; BRVO Branch retinal vein occlusion; CRVO central retinal vein occlusion; CRAO central retinal artery occlusion; BRAO branch retinal artery occlusion; RT   retinal tear; SB scleral buckle; PPV pars plana vitrectomy; VH Vitreous hemorrhage; PRP panretinal laser photocoagulation; IVK intravitreal kenalog; VMT vitreomacular traction; MH Macular hole;  NVD neovascularization of the disc; NVE neovascularization elsewhere; AREDS age related eye disease study; ARMD age related macular degeneration; POAG primary open angle glaucoma; EBMD epithelial/anterior basement membrane dystrophy; ACIOL anterior chamber intraocular lens; IOL intraocular lens; PCIOL posterior chamber intraocular lens; Phaco/IOL phacoemulsification with intraocular lens placement; PRK photorefractive keratectomy; LASIK laser assisted in situ keratomileusis; HTN hypertension; DM diabetes mellitus; COPD chronic obstructive pulmonary disease  

## 2021-05-30 NOTE — Assessment & Plan Note (Signed)
No signs of progression of proliferative diabetic retinopathy, quiet

## 2021-05-30 NOTE — Assessment & Plan Note (Signed)
Progressive optic atrophy and vision loss.  I do recommend patient have formal sleep study testing to a recent hospitalization apparent nightly monitoring suggesting to him that he has sleep apnea, per report as he was being discharged.

## 2021-05-31 DIAGNOSIS — N186 End stage renal disease: Secondary | ICD-10-CM | POA: Diagnosis not present

## 2021-05-31 DIAGNOSIS — Z992 Dependence on renal dialysis: Secondary | ICD-10-CM | POA: Diagnosis not present

## 2021-06-03 DIAGNOSIS — Z992 Dependence on renal dialysis: Secondary | ICD-10-CM | POA: Diagnosis not present

## 2021-06-03 DIAGNOSIS — N186 End stage renal disease: Secondary | ICD-10-CM | POA: Diagnosis not present

## 2021-06-04 ENCOUNTER — Ambulatory Visit: Payer: Medicare HMO | Admitting: Internal Medicine

## 2021-06-05 DIAGNOSIS — N186 End stage renal disease: Secondary | ICD-10-CM | POA: Diagnosis not present

## 2021-06-05 DIAGNOSIS — I259 Chronic ischemic heart disease, unspecified: Secondary | ICD-10-CM | POA: Diagnosis not present

## 2021-06-05 DIAGNOSIS — Z992 Dependence on renal dialysis: Secondary | ICD-10-CM | POA: Diagnosis not present

## 2021-06-06 DIAGNOSIS — E782 Mixed hyperlipidemia: Secondary | ICD-10-CM | POA: Diagnosis not present

## 2021-06-06 DIAGNOSIS — R109 Unspecified abdominal pain: Secondary | ICD-10-CM | POA: Diagnosis not present

## 2021-06-06 DIAGNOSIS — G4710 Hypersomnia, unspecified: Secondary | ICD-10-CM | POA: Diagnosis not present

## 2021-06-06 DIAGNOSIS — F319 Bipolar disorder, unspecified: Secondary | ICD-10-CM | POA: Diagnosis not present

## 2021-06-06 DIAGNOSIS — D631 Anemia in chronic kidney disease: Secondary | ICD-10-CM | POA: Diagnosis not present

## 2021-06-06 DIAGNOSIS — I10 Essential (primary) hypertension: Secondary | ICD-10-CM | POA: Diagnosis not present

## 2021-06-06 DIAGNOSIS — H409 Unspecified glaucoma: Secondary | ICD-10-CM | POA: Diagnosis not present

## 2021-06-06 DIAGNOSIS — K5909 Other constipation: Secondary | ICD-10-CM | POA: Diagnosis not present

## 2021-06-06 DIAGNOSIS — E1165 Type 2 diabetes mellitus with hyperglycemia: Secondary | ICD-10-CM | POA: Diagnosis not present

## 2021-06-07 DIAGNOSIS — Z992 Dependence on renal dialysis: Secondary | ICD-10-CM | POA: Diagnosis not present

## 2021-06-07 DIAGNOSIS — N186 End stage renal disease: Secondary | ICD-10-CM | POA: Diagnosis not present

## 2021-06-10 DIAGNOSIS — N186 End stage renal disease: Secondary | ICD-10-CM | POA: Diagnosis not present

## 2021-06-10 DIAGNOSIS — Z992 Dependence on renal dialysis: Secondary | ICD-10-CM | POA: Diagnosis not present

## 2021-06-12 DIAGNOSIS — N186 End stage renal disease: Secondary | ICD-10-CM | POA: Diagnosis not present

## 2021-06-12 DIAGNOSIS — Z992 Dependence on renal dialysis: Secondary | ICD-10-CM | POA: Diagnosis not present

## 2021-06-14 DIAGNOSIS — R1013 Epigastric pain: Secondary | ICD-10-CM | POA: Diagnosis not present

## 2021-06-14 DIAGNOSIS — E1121 Type 2 diabetes mellitus with diabetic nephropathy: Secondary | ICD-10-CM | POA: Diagnosis not present

## 2021-06-14 DIAGNOSIS — E1122 Type 2 diabetes mellitus with diabetic chronic kidney disease: Secondary | ICD-10-CM | POA: Diagnosis not present

## 2021-06-14 DIAGNOSIS — N186 End stage renal disease: Secondary | ICD-10-CM | POA: Diagnosis not present

## 2021-06-14 DIAGNOSIS — E11319 Type 2 diabetes mellitus with unspecified diabetic retinopathy without macular edema: Secondary | ICD-10-CM | POA: Diagnosis not present

## 2021-06-14 DIAGNOSIS — R456 Violent behavior: Secondary | ICD-10-CM | POA: Diagnosis not present

## 2021-06-14 DIAGNOSIS — H548 Legal blindness, as defined in USA: Secondary | ICD-10-CM | POA: Diagnosis not present

## 2021-06-14 DIAGNOSIS — R1111 Vomiting without nausea: Secondary | ICD-10-CM | POA: Diagnosis not present

## 2021-06-14 DIAGNOSIS — K3184 Gastroparesis: Secondary | ICD-10-CM | POA: Diagnosis not present

## 2021-06-14 DIAGNOSIS — R1084 Generalized abdominal pain: Secondary | ICD-10-CM | POA: Diagnosis not present

## 2021-06-14 DIAGNOSIS — R109 Unspecified abdominal pain: Secondary | ICD-10-CM | POA: Diagnosis not present

## 2021-06-14 DIAGNOSIS — E1143 Type 2 diabetes mellitus with diabetic autonomic (poly)neuropathy: Secondary | ICD-10-CM | POA: Diagnosis not present

## 2021-06-14 DIAGNOSIS — I10 Essential (primary) hypertension: Secondary | ICD-10-CM | POA: Diagnosis not present

## 2021-06-14 DIAGNOSIS — I120 Hypertensive chronic kidney disease with stage 5 chronic kidney disease or end stage renal disease: Secondary | ICD-10-CM | POA: Diagnosis not present

## 2021-06-14 DIAGNOSIS — Z992 Dependence on renal dialysis: Secondary | ICD-10-CM | POA: Diagnosis not present

## 2021-06-15 DIAGNOSIS — M86179 Other acute osteomyelitis, unspecified ankle and foot: Secondary | ICD-10-CM | POA: Diagnosis not present

## 2021-06-15 DIAGNOSIS — N183 Chronic kidney disease, stage 3 unspecified: Secondary | ICD-10-CM | POA: Diagnosis not present

## 2021-06-15 DIAGNOSIS — E118 Type 2 diabetes mellitus with unspecified complications: Secondary | ICD-10-CM | POA: Diagnosis not present

## 2021-06-15 DIAGNOSIS — N186 End stage renal disease: Secondary | ICD-10-CM | POA: Diagnosis not present

## 2021-06-15 DIAGNOSIS — E1122 Type 2 diabetes mellitus with diabetic chronic kidney disease: Secondary | ICD-10-CM | POA: Diagnosis not present

## 2021-06-17 ENCOUNTER — Ambulatory Visit: Payer: Medicare HMO | Admitting: Podiatry

## 2021-06-17 DIAGNOSIS — I871 Compression of vein: Secondary | ICD-10-CM | POA: Diagnosis not present

## 2021-06-17 DIAGNOSIS — Z992 Dependence on renal dialysis: Secondary | ICD-10-CM | POA: Diagnosis not present

## 2021-06-17 DIAGNOSIS — T82868A Thrombosis of vascular prosthetic devices, implants and grafts, initial encounter: Secondary | ICD-10-CM | POA: Diagnosis not present

## 2021-06-17 DIAGNOSIS — N186 End stage renal disease: Secondary | ICD-10-CM | POA: Diagnosis not present

## 2021-06-18 DIAGNOSIS — Z992 Dependence on renal dialysis: Secondary | ICD-10-CM | POA: Diagnosis not present

## 2021-06-18 DIAGNOSIS — N186 End stage renal disease: Secondary | ICD-10-CM | POA: Diagnosis not present

## 2021-06-19 DIAGNOSIS — Z992 Dependence on renal dialysis: Secondary | ICD-10-CM | POA: Diagnosis not present

## 2021-06-19 DIAGNOSIS — N186 End stage renal disease: Secondary | ICD-10-CM | POA: Diagnosis not present

## 2021-06-20 DIAGNOSIS — Z961 Presence of intraocular lens: Secondary | ICD-10-CM | POA: Diagnosis not present

## 2021-06-20 DIAGNOSIS — H4089 Other specified glaucoma: Secondary | ICD-10-CM | POA: Diagnosis not present

## 2021-06-20 DIAGNOSIS — Z794 Long term (current) use of insulin: Secondary | ICD-10-CM | POA: Diagnosis not present

## 2021-06-20 DIAGNOSIS — H401133 Primary open-angle glaucoma, bilateral, severe stage: Secondary | ICD-10-CM | POA: Diagnosis not present

## 2021-06-20 DIAGNOSIS — E113513 Type 2 diabetes mellitus with proliferative diabetic retinopathy with macular edema, bilateral: Secondary | ICD-10-CM | POA: Diagnosis not present

## 2021-06-21 DIAGNOSIS — Z992 Dependence on renal dialysis: Secondary | ICD-10-CM | POA: Diagnosis not present

## 2021-06-21 DIAGNOSIS — N186 End stage renal disease: Secondary | ICD-10-CM | POA: Diagnosis not present

## 2021-06-22 ENCOUNTER — Encounter (HOSPITAL_COMMUNITY): Payer: Self-pay | Admitting: *Deleted

## 2021-06-22 ENCOUNTER — Emergency Department (HOSPITAL_COMMUNITY)
Admission: EM | Admit: 2021-06-22 | Discharge: 2021-06-22 | Disposition: A | Discharge status: Home / Self Care | Payer: Medicare HMO | Attending: Emergency Medicine | Admitting: Emergency Medicine

## 2021-06-22 DIAGNOSIS — Z79899 Other long term (current) drug therapy: Secondary | ICD-10-CM | POA: Insufficient documentation

## 2021-06-22 DIAGNOSIS — R059 Cough, unspecified: Secondary | ICD-10-CM | POA: Insufficient documentation

## 2021-06-22 DIAGNOSIS — R112 Nausea with vomiting, unspecified: Secondary | ICD-10-CM | POA: Insufficient documentation

## 2021-06-22 DIAGNOSIS — Z8616 Personal history of COVID-19: Secondary | ICD-10-CM | POA: Diagnosis not present

## 2021-06-22 DIAGNOSIS — R1013 Epigastric pain: Secondary | ICD-10-CM | POA: Diagnosis not present

## 2021-06-22 DIAGNOSIS — I509 Heart failure, unspecified: Secondary | ICD-10-CM | POA: Diagnosis not present

## 2021-06-22 DIAGNOSIS — Z794 Long term (current) use of insulin: Secondary | ICD-10-CM | POA: Diagnosis not present

## 2021-06-22 DIAGNOSIS — Z992 Dependence on renal dialysis: Secondary | ICD-10-CM | POA: Insufficient documentation

## 2021-06-22 DIAGNOSIS — N186 End stage renal disease: Secondary | ICD-10-CM | POA: Diagnosis not present

## 2021-06-22 DIAGNOSIS — I132 Hypertensive heart and chronic kidney disease with heart failure and with stage 5 chronic kidney disease, or end stage renal disease: Secondary | ICD-10-CM | POA: Insufficient documentation

## 2021-06-22 DIAGNOSIS — E1122 Type 2 diabetes mellitus with diabetic chronic kidney disease: Secondary | ICD-10-CM | POA: Diagnosis not present

## 2021-06-22 LAB — CBC WITH DIFFERENTIAL/PLATELET
Abs Immature Granulocytes: 0.02 10*3/uL (ref 0.00–0.07)
Basophils Absolute: 0 10*3/uL (ref 0.0–0.1)
Basophils Relative: 1 %
Eosinophils Absolute: 0 10*3/uL (ref 0.0–0.5)
Eosinophils Relative: 1 %
HCT: 36.3 % — ABNORMAL LOW (ref 39.0–52.0)
Hemoglobin: 12.4 g/dL — ABNORMAL LOW (ref 13.0–17.0)
Immature Granulocytes: 0 %
Lymphocytes Relative: 24 %
Lymphs Abs: 1.5 10*3/uL (ref 0.7–4.0)
MCH: 35 pg — ABNORMAL HIGH (ref 26.0–34.0)
MCHC: 34.2 g/dL (ref 30.0–36.0)
MCV: 102.5 fL — ABNORMAL HIGH (ref 80.0–100.0)
Monocytes Absolute: 1.1 10*3/uL — ABNORMAL HIGH (ref 0.1–1.0)
Monocytes Relative: 17 %
Neutro Abs: 3.6 10*3/uL (ref 1.7–7.7)
Neutrophils Relative %: 57 %
Platelets: 124 10*3/uL — ABNORMAL LOW (ref 150–400)
RBC: 3.54 MIL/uL — ABNORMAL LOW (ref 4.22–5.81)
RDW: 12.1 % (ref 11.5–15.5)
WBC: 6.2 10*3/uL (ref 4.0–10.5)
nRBC: 0 % (ref 0.0–0.2)

## 2021-06-22 LAB — COMPREHENSIVE METABOLIC PANEL
ALT: 18 U/L (ref 0–44)
AST: 28 U/L (ref 15–41)
Albumin: 3.6 g/dL (ref 3.5–5.0)
Alkaline Phosphatase: 53 U/L (ref 38–126)
Anion gap: 15 (ref 5–15)
BUN: 39 mg/dL — ABNORMAL HIGH (ref 6–20)
CO2: 23 mmol/L (ref 22–32)
Calcium: 9.1 mg/dL (ref 8.9–10.3)
Chloride: 96 mmol/L — ABNORMAL LOW (ref 98–111)
Creatinine, Ser: 6.42 mg/dL — ABNORMAL HIGH (ref 0.61–1.24)
GFR, Estimated: 10 mL/min — ABNORMAL LOW (ref >60)
Glucose, Bld: 128 mg/dL — ABNORMAL HIGH (ref 70–99)
Potassium: 3.4 mmol/L — ABNORMAL LOW (ref 3.5–5.1)
Sodium: 134 mmol/L — ABNORMAL LOW (ref 135–145)
Total Bilirubin: 0.6 mg/dL (ref 0.3–1.2)
Total Protein: 7.6 g/dL (ref 6.5–8.1)

## 2021-06-22 LAB — LIPASE, BLOOD: Lipase: 48 U/L (ref 11–51)

## 2021-06-22 MED ORDER — HYDROMORPHONE HCL 2 MG/ML IJ SOLN
2.0000 mg | Freq: Once | INTRAMUSCULAR | Status: DC
Start: 2021-06-22 — End: 2021-06-22
  Filled 2021-06-22: qty 1

## 2021-06-22 MED ORDER — HYDROMORPHONE HCL 1 MG/ML IJ SOLN
1.0000 mg | Freq: Once | INTRAMUSCULAR | Status: AC
  Administered 2021-06-22: 1 mg via INTRAVENOUS

## 2021-06-22 MED ORDER — METOCLOPRAMIDE HCL 5 MG/ML IJ SOLN
10.0000 mg | Freq: Once | INTRAMUSCULAR | Status: AC
  Administered 2021-06-22: 10 mg via INTRAVENOUS

## 2021-06-22 MED ORDER — HYDROMORPHONE HCL 1 MG/ML IJ SOLN
1.0000 mg | Freq: Once | INTRAMUSCULAR | Status: AC
  Administered 2021-06-22: 1 mg via INTRAVENOUS
  Filled 2021-06-22: qty 1

## 2021-06-22 MED ORDER — METOCLOPRAMIDE HCL 5 MG/ML IJ SOLN
10.0000 mg | Freq: Once | INTRAMUSCULAR | Status: DC
  Filled 2021-06-22: qty 2

## 2021-06-22 NOTE — ED Triage Notes (Signed)
Abdominal pain with vomiting 

## 2021-06-22 NOTE — ED Notes (Signed)
Pt provided some water for PO challenge. Pt able to slowly drink the entire glass without increased N/V or abdominal pain

## 2021-06-22 NOTE — Discharge Instructions (Signed)

## 2021-06-22 NOTE — ED Provider Notes (Signed)
[facility] EMERGENCY DEPARTMENT Provider Note   CSN: [id number] Arrival date & time: 06/22/21  1055     History Chief Complaint  Patient presents with   Cough   Abdominal Pain    Timothy Clark is a 50 y.o. male with multiple ED visits in the past 6 months who presents emergency department chief complaint of epigastric abdominal pain and vomiting.  He is history of end-stage renal disease on hemodialysis Monday Wednesday Friday with last full dialysis yesterday.  He has a history of gastroparesis and states that his pain is identical to previous episodes of gastroparesis.  Patient states that he woke this morning with epigastric abdominal pain.  He has had multiple episodes of nausea and vomiting.  He is passing gas.  He took Reglan and Bentyl at home without any relief of his symptoms.  He denies chest pain or shortness of breath.  He denies urinary symptoms or flank pain.  He denies fevers, chills, cough, body aches.   Cough Abdominal Pain Associated symptoms: cough       Past Medical History:  Diagnosis Date   Anemia    Anxiety    CHF (congestive heart failure) (HCC)    diastolic dysfunction   Chronic abdominal pain    COVID-19 07/2019   Depression    Esophagitis    ESRD on hemodialysis ([place])    undergoing evaluation for transplant   Eye hemorrhage    Gastritis    Gastroparesis    GERD (gastroesophageal reflux disease)    Glaucoma    Helicobacter pylori (H. pylori) infection 2016   ERADICATION DOCUMENTED VIA STOOL TEST in AUG 2016 at [facility]   Hiccups    Hypertension    Nausea and vomiting    chronic, recurrent   Neuropathy    feet   Renal insufficiency    Type 2 diabetes mellitus with complications ([place])    diagnosed around age 85   Vitreous hemorrhage of left eye ([place]) 12/15/2019    Patient Active Problem List   Diagnosis Date Noted   Snores 05/30/2021   Primary open angle glaucoma of both eyes, severe stage 05/30/2021   Seizures ([place]) 01/02/2021    Seizure ([place]) 01/02/2021   Problem with vascular access 09/17/2020   Bipolar disorder ([place]) 09/17/2020   Acute encephalopathy 07/24/2020   Intractable abdominal pain 07/23/2020   Encephalopathy    Colon cancer screening 07/12/2020   Optic atrophy, left eye 07/03/2020   Stable treated proliferative diabetic retinopathy of right eye determined by examination associated with type 2 diabetes mellitus ([place]) 07/03/2020   Anxiety 03/13/2020   History of COVID-19 03/13/2020   History of gastroesophageal reflux (GERD) 35/57/3220   History of Helicobacter pylori infection 03/13/2020   Pre-transplant evaluation for kidney transplant 03/13/2020   Renovascular hypertension 03/13/2020   Hematemesis 03/02/2020   Coffee ground emesis 25/42/7062   Complication of vascular access for dialysis 01/29/2020   Follow-up examination after eye surgery 12/15/2019   Type 2 diabetes with nephropathy ([place]) 12/15/2019   Stable treated proliferative diabetic retinopathy of left eye determined by examination associated with type 2 diabetes mellitus ([place]) 12/15/2019   Secondary glaucoma due to combination mechanisms, left, severe stage 12/15/2019   Nausea with vomiting 12/08/2019   Hyperglycemia due to type 2 diabetes mellitus ([place]) 10/28/2019   DKA (diabetic ketoacidoses) 10/27/2019   ESRD on dialysis ([place]) 06/26/2019   Constipation 03/15/2019   Type 2 diabetes mellitus with proliferative retinopathy ([place]) 10/25/2018   Extravasation injury of IV  catheter site with other complication, initial encounter ([place])    Intractable vomiting    Chronic kidney disease    Poor venous access    Gastroparesis 10/24/2018   Closed displaced fracture of lateral end of right clavicle 06/18/2018   HTN (hypertension), malignant 06/13/2018   Scrotal swelling 11/10/2017   Swelling 11/09/2017   Scrotal edema    Obesity, Class III, BMI 40-49.9 (morbid obesity) ([place]) 11/02/2017   Wound eschars of foot, right  10/30/2017   Chronic  abdominal pain 09/21/2017   Hypertensive urgency 09/21/2017   Prolonged QT interval 09/21/2017   GERD (gastroesophageal reflux disease) 07/14/2017   Glaucoma 07/14/2017   Anemia 07/14/2017   Pressure injury of skin 02/01/2017   Insulin dependent diabetes mellitus 09/07/2016   Orthostatic syncope 09/05/2016   Dumping syndrome 04/22/2016   Gastroparesis due to DM ([place]) 02/06/2015   Essential hypertension 12/06/2014   Diabetic macular edema ([place]) 02/18/2014   PDR (proliferative diabetic retinopathy) ([place]) 02/06/2014   Neovascular glaucoma of left eye 01/11/2014   Diabetic infection of left heel ([place]) 02/14/2010   UNSPECIFIED PERIPHERAL VASCULAR DISEASE 10/19/2009   ERECTILE DYSFUNCTION, ORGANIC 10/19/2009   NUMBNESS 07/20/2009   Type 2 diabetes mellitus with ESRD (end-stage renal disease) ([place]) 03/22/2009   Hyperlipidemia 03/22/2009   Depression 03/22/2009    Past Surgical History:  Procedure Laterality Date   AMPUTATION TOE Right    great toe   AV FISTULA INSERTION W/ RF MAGNETIC GUIDANCE Left 04/06/2019   Procedure: AV FISTULA INSERTION W/RF MAGNETIC GUIDANCE;  Surgeon: Katha Cabal, MD;  Location: [place] [facility];  Service: Cardiovascular;  Laterality: Left;   AV FISTULA PLACEMENT Left 05/06/2019   Procedure: INSERTION OF ARTERIOVENOUS (AV) GORE-TEX GRAFT ARM ( FOREARM LOOP );  Surgeon: Katha Cabal, MD;  Location: [facility];  Service: Vascular;  Laterality: Left;   AV FISTULA PLACEMENT Left 03/21/2020   Procedure: INSERTION OF ARTERIOVENOUS (AV) GORE-TEX GRAFT ARM ( BRACHIAL AXILLARY );  Surgeon: Katha Cabal, MD;  Location: [facility];  Service: Vascular;  Laterality: Left;   CATARACT EXTRACTION W/PHACO Left 05/19/2016   Procedure: CATARACT EXTRACTION PHACO AND INTRAOCULAR LENS PLACEMENT LEFT EYE;  Surgeon: Tonny Branch, MD;  Location: [facility];  Service: Ophthalmology;  Laterality: Left;  CDE: 7.30   CATARACT EXTRACTION W/PHACO Right 06/23/2016   Procedure:  CATARACT EXTRACTION PHACO AND INTRAOCULAR LENS PLACEMENT RIGHT EYE CDE=9.87;  Surgeon: Tonny Branch, MD;  Location: [facility];  Service: Ophthalmology;  Laterality: Right;  right   DIALYSIS/PERMA CATHETER REMOVAL N/A 07/19/2019   Procedure: DIALYSIS/PERMA CATHETER REMOVAL;  Surgeon: Katha Cabal, MD;  Location: [place] [facility];  Service: Cardiovascular;  Laterality: N/A;   ESOPHAGOGASTRODUODENOSCOPY  2015   Dr. Britta Mccreedy   ESOPHAGOGASTRODUODENOSCOPY N/A 10/26/2014   RMR: Distal esophagititis-likely reflux related although an element of pill induced injuruy no exclueded  status post biopsy. Diffusely abnormal gastric mucosa of uncertain signigicane -status post gastric biopsy. Focal area of excoriation in the cardia most consistant with a trauma of heaving.    ESOPHAGOGASTRODUODENOSCOPY  08/2015   [facility]: mild esophagitis and old gastric contents   EYE SURGERY Left 2015   stent placed to left eye   EYE SURGERY Right 06/2019   per patient- to remove scar tissue   INCISION AND DRAINAGE OF WOUND Right 07/16/2017   Procedure: IRRIGATION AND DEBRIDEMENT OF SOFT TISSUE OF ULCERATION RIGHT FOOT;  Surgeon: Caprice Beaver, DPM;  Location: [facility];  Service: Podiatry;  Laterality: Right;  IR THROMBECTOMY AV FISTULA W/THROMBOLYSIS/PTA INC/SHUNT/IMG LEFT Left 09/19/2020   IR THROMBECTOMY AV FISTULA W/THROMBOLYSIS/PTA INC/SHUNT/IMG LEFT Left 05/28/2021   IR US GUIDE VASC ACCESS LEFT  09/19/2020   IR US GUIDE VASC ACCESS LEFT  05/28/2021   PERIPHERAL VASCULAR THROMBECTOMY Left 09/27/2019   Procedure: PERIPHERAL VASCULAR THROMBECTOMY;  Surgeon: Katha Cabal, MD;  Location: [place] [facility];  Service: Cardiovascular;  Laterality: Left;   PERIPHERAL VASCULAR THROMBECTOMY Left 10/27/2019   Procedure: PERIPHERAL VASCULAR THROMBECTOMY;  Surgeon: Algernon Huxley, MD;  Location: [place] [facility];  Service: Cardiovascular;  Laterality: Left;   PERIPHERAL VASCULAR THROMBECTOMY Left 12/13/2019   Procedure:  PERIPHERAL VASCULAR THROMBECTOMY;  Surgeon: Katha Cabal, MD;  Location: [place] [facility];  Service: Cardiovascular;  Laterality: Left;       Family History  Problem Relation Age of Onset   Ovarian cancer Mother    Heart attack Father    Colon polyps Father    Cervical cancer Sister    Diabetes Sister    Colon cancer Neg Hx     Social History   Tobacco Use   Smoking status: Never   Smokeless tobacco: Never  Vaping Use   Vaping Use: Never used  Substance Use Topics   Alcohol use: No    Alcohol/week: 0.0 standard drinks   Drug use: No    Home Medications Prior to Admission medications   Medication Sig Start Date End Date Taking? Authorizing Provider  ALPRAZolam Duanne Moron) 0.5 MG tablet Take 0.5 mg by mouth 2 (two) times daily as needed for anxiety.    [provider]  amLODipine (NORVASC) 5 MG tablet Take 5 mg by mouth daily.    [provider]  atorvastatin (LIPITOR) 10 MG tablet Take 1 tablet (10 mg total) by mouth at bedtime. 10/13/20 05/27/21  Johnson, Clanford L, MD  brimonidine (ALPHAGAN) 0.15 % ophthalmic solution Place 1 drop into both eyes in the morning and at bedtime.     [provider]  dicyclomine (BENTYL) 20 MG tablet Take 1 tablet (20 mg total) by mouth 2 (two) times daily. Patient taking differently: Take 20 mg by mouth 2 (two) times daily as needed for spasms. 05/12/21   Jacqlyn Larsen, PA-C  divalproex (DEPAKOTE) 250 MG DR tablet Take 750 mg by mouth 3 (three) times daily.    [provider]  dorzolamide-timolol (COSOPT) 22.3-6.8 MG/ML ophthalmic solution Place 1 drop into both eyes 2 (two) times daily.  01/16/20   [provider]  famotidine (PEPCID) 20 MG tablet Take 1 tablet (20 mg total) by mouth daily. 05/28/21   Dwyane Dee, MD  folic acid (FOLVITE) 1 MG tablet Take 1 mg by mouth daily.    [provider]  gentamicin cream (GARAMYCIN) 0.1 % Apply 1 application topically 2 (two) times daily.  12/12/20   Edrick Kins, DPM  Glucagon (GVOKE HYPOPEN 2-PACK) 1 MG/0.2ML SOAJ Inject 1 Syringe into the skin once as needed (hypoglycemia).    [provider]  Iron-FA-B Cmp-C-Biot-Probiotic (FUSION PLUS PO) Take 1 capsule by mouth daily.    [provider]  LEADER UNIFINE PENTIPS PLUS 32G X 4 MM MISC  05/17/20   [provider]  metoCLOPramide (REGLAN) 10 MG tablet Take 1 tablet (10 mg total) by mouth every 6 (six) hours. 05/12/21   Jacqlyn Larsen, PA-C  NOVOLOG MIX 70/30 FLEXPEN (70-30) 100 UNIT/ML FlexPen Inject 25-35 Units into the skin See admin instructions. Inject 35 units in  the morning and 25 every evening - Sliding scale 02/06/21   [provider]  ondansetron (ZOFRAN-ODT) 4 MG disintegrating tablet Take 4 mg by mouth every 8 (eight) hours as needed for nausea or vomiting.    [provider]  polyethylene glycol (MIRALAX / GLYCOLAX) 17 g packet Take 17 g by mouth daily as needed for mild constipation. Patient taking differently: Take 17 g by mouth daily as needed for moderate constipation. 10/13/20   Johnson, Clanford L, MD  senna-docusate (SENOKOT-S) 8.6-50 MG tablet Take 1 tablet by mouth 2 (two) times daily. Patient not taking: No sig reported 10/13/20   Wynetta Emery, Clanford L, MD  sitaGLIPtin (JANUVIA) 25 MG tablet Take 25 mg by mouth daily.     [provider]  sucralfate (CARAFATE) 1 g tablet Take 1 g by mouth 4 (four) times daily.    [provider]  torsemide (DEMADEX) 20 MG tablet Take 40 mg by mouth daily. At noon    [provider]  ziprasidone (GEODON) 20 MG capsule Take 1 tab with breakfast and 2 tabs at HS Patient taking differently: Take 20-40 mg by mouth See admin instructions. Take 20 mg capsule with breakfast and 40 mg at bedtime 10/13/20   Murlean Iba, MD    Allergies    Donnatal [phenobarbital-belladonna alk], Fish allergy, Haldol [haloperidol lactate], Lidocaine, Maalox [calcium carbonate antacid],  Shellfish allergy, Aspirin, Doxycycline, Keflex [cephalexin], Marinol [dronabinol], Other, Penicillins, and Bactrim [sulfamethoxazole-trimethoprim]  Review of Systems   Review of Systems  Respiratory:  Positive for cough.   Gastrointestinal:  Positive for abdominal pain.   Physical Exam Updated Vital Signs BP (!) 144/84 (BP Location: Right Arm)   Pulse 94   Temp 97.9 F (36.6 C) (Oral)   Resp 18   SpO2 100%   Physical Exam  ED Results / Procedures / Treatments   Labs (all labs ordered are listed, but only abnormal results are displayed) Labs Reviewed - No data to display  EKG None  Radiology No results found.  Procedures Procedures   Medications Ordered in ED Medications - No data to display  ED Course  I have reviewed the triage vital signs and the nursing notes.  Pertinent labs & imaging results that were available during my care of the patient were reviewed by me and considered in my medical decision making (see chart for details).    MDM Rules/Calculators/A&P                           Patient here with epigastric abdominal pain, nausea and vomiting Differential diagnosis of epigastric pain includes: Functional or nonulcer dyspepsia ( PUD, GERD, Gastritis, (NSAIDs, alcohol, stress, H. pylori, pernicious anemia), pancreatitis or pancreatic cancer, overeating indigestion (high-fat foods, coffee), drugs (aspirin, antibiotics (eg, macrolides, metronidazole), corticosteroids, digoxin, narcotics, theophylline), gastroparesis, lactose intolerance, malabsorption gastric cancer, parasitic infection, (Giardia, Strongyloides, Ascaris) cholelithiasis, choledocholithiasis, or cholangitis, ACS, pericarditis, pneumonia, abdominal hernia, pregnancy, intestinal ischemia, esophageal rupture, gastric volvulus, hepatitis. Patient's symptoms are consistent with recurrent gastroparesis.  Patient given 2 rounds of IV pain medication and antiemetics.  He states that his pain is resolved.   He has not had any active vomiting here in the emergency department and is currently tolerating p.o. fluids.  I reviewed the patient's labs including lipase which is within normal limits, CMP with elevated creatinine and BUN consistent with history of renal disease, CBC without significant abnormality.  Patient appears appropriate for discharge at this time.  He feels comfortable with plan.  Discussed return precautions. Final Clinical Impression(s) / ED Diagnoses Final diagnoses:  None    Rx / DC Orders ED Discharge Orders     None        Margarita Mail, PA-C 06/22/21 1811    Noemi Chapel, MD 06/23/21 1455

## 2021-06-24 ENCOUNTER — Other Ambulatory Visit: Payer: Self-pay

## 2021-06-24 ENCOUNTER — Ambulatory Visit (INDEPENDENT_AMBULATORY_CARE_PROVIDER_SITE_OTHER): Payer: Medicare HMO | Admitting: Podiatry

## 2021-06-24 DIAGNOSIS — E08621 Diabetes mellitus due to underlying condition with foot ulcer: Secondary | ICD-10-CM | POA: Diagnosis not present

## 2021-06-24 DIAGNOSIS — L97522 Non-pressure chronic ulcer of other part of left foot with fat layer exposed: Secondary | ICD-10-CM | POA: Diagnosis not present

## 2021-06-24 DIAGNOSIS — Z992 Dependence on renal dialysis: Secondary | ICD-10-CM | POA: Diagnosis not present

## 2021-06-24 DIAGNOSIS — N186 End stage renal disease: Secondary | ICD-10-CM | POA: Diagnosis not present

## 2021-06-24 NOTE — Progress Notes (Signed)
Subjective:  50 y.o. male with PMHx of diabetes mellitus, ESRD, presenting for follow-up evaluation of an ulcer that is developed to the left hallux.  Patient dresses his dressings on his own and applies the gentamicin cream.  He says that his sister helps him.  No new complaints at this time  Past Medical History:  Diagnosis Date   Anemia    Anxiety    CHF (congestive heart failure) (HCC)    diastolic dysfunction   Chronic abdominal pain    COVID-19 07/2019   Depression    Esophagitis    ESRD on hemodialysis ([place])    undergoing evaluation for transplant   Eye hemorrhage    Gastritis    Gastroparesis    GERD (gastroesophageal reflux disease)    Glaucoma    Helicobacter pylori (H. pylori) infection 2016   ERADICATION DOCUMENTED VIA STOOL TEST in AUG 2016 at [facility]   Hiccups    Hypertension    Nausea and vomiting    chronic, recurrent   Neuropathy    feet   Renal insufficiency    Type 2 diabetes mellitus with complications ([place])    diagnosed around age 56   Vitreous hemorrhage of left eye ([place]) 12/15/2019    Objective/Physical Exam General: The patient is alert and oriented x3 in no acute distress.  Dermatology:  Wound #1 noted to the left hallux measuring approximately 0.8 x 0.8 x 0.1 cm (LxWxD).   Overall the wound appears stable and somewhat unchanged.  To the noted ulceration(s), there is no eschar. There is a moderate amount of slough, fibrin, and necrotic tissue noted. Granulation tissue and wound base is red. There is a minimal amount of serosanguineous drainage noted. There is no exposed bone muscle-tendon ligament or joint. There is no malodor. Periwound integrity is intact. Skin is warm, dry and supple bilateral lower extremities.  Thickened, hyperkeratotic, dystrophic, elongated nails also noted bilateral  Vascular: Palpable pedal pulses bilaterally. No edema or erythema noted. Capillary refill within normal limits.  Neurological: Epicritic and protective  threshold diminished bilaterally.   Musculoskeletal Exam: History of partial first ray amputation right foot  Radiographic exam: Normal osseous mineralization.  No osteolytic process underneath the left great toe.  No concern at the moment for osteomyelitis of the  Assessment: 1.  Ulcer left hallux  secondary to diabetes mellitus 2. diabetes mellitus w/ peripheral neuropathy 3. H/o partial first ray amputation right foot   Plan of Care:  1. Patient was evaluated.  2. medically necessary excisional debridement including subcutaneous tissue was performed using a tissue nipper and a chisel blade. Excisional debridement of all the necrotic nonviable tissue down to healthy bleeding viable tissue was performed with post-debridement measurements same as pre-. 3. the wound was cleansed and dry sterile dressing applied. 4.  Patient is not having home health care.  He is changing the dressings on his own.  He has been applying gentamicin cream with a light Band-Aid daily  5.  Patient states that he cannot wear the diabetic shoes because it throws his balance off and puts him at risk of falls.  He has been wearing the felt offloading dancers pads to the insoles of his new balance shoes however they had shifted and not in the correct position today.  They were adjusted today 6.  Return to clinic 6 weeks for follow-up x-ray 7.  Patient was denied skin graft application  Edrick Kins, DPM [facility]  Dr. Edrick Kins,  DPM    [address],  [address]                Office [phone]  Fax [phone]

## 2021-06-26 DIAGNOSIS — Z992 Dependence on renal dialysis: Secondary | ICD-10-CM | POA: Diagnosis not present

## 2021-06-26 DIAGNOSIS — N186 End stage renal disease: Secondary | ICD-10-CM | POA: Diagnosis not present

## 2021-06-28 DIAGNOSIS — Z992 Dependence on renal dialysis: Secondary | ICD-10-CM | POA: Diagnosis not present

## 2021-06-28 DIAGNOSIS — N186 End stage renal disease: Secondary | ICD-10-CM | POA: Diagnosis not present

## 2021-07-01 DIAGNOSIS — Z992 Dependence on renal dialysis: Secondary | ICD-10-CM | POA: Diagnosis not present

## 2021-07-01 DIAGNOSIS — N186 End stage renal disease: Secondary | ICD-10-CM | POA: Diagnosis not present

## 2021-07-02 DIAGNOSIS — H401133 Primary open-angle glaucoma, bilateral, severe stage: Secondary | ICD-10-CM | POA: Diagnosis not present

## 2021-07-02 DIAGNOSIS — H42 Glaucoma in diseases classified elsewhere: Secondary | ICD-10-CM | POA: Diagnosis not present

## 2021-07-02 DIAGNOSIS — Z961 Presence of intraocular lens: Secondary | ICD-10-CM | POA: Diagnosis not present

## 2021-07-02 DIAGNOSIS — E1140 Type 2 diabetes mellitus with diabetic neuropathy, unspecified: Secondary | ICD-10-CM | POA: Diagnosis not present

## 2021-07-02 DIAGNOSIS — I509 Heart failure, unspecified: Secondary | ICD-10-CM | POA: Diagnosis not present

## 2021-07-02 DIAGNOSIS — Z794 Long term (current) use of insulin: Secondary | ICD-10-CM | POA: Diagnosis not present

## 2021-07-02 DIAGNOSIS — H401113 Primary open-angle glaucoma, right eye, severe stage: Secondary | ICD-10-CM | POA: Diagnosis not present

## 2021-07-02 DIAGNOSIS — E1122 Type 2 diabetes mellitus with diabetic chronic kidney disease: Secondary | ICD-10-CM | POA: Diagnosis not present

## 2021-07-02 DIAGNOSIS — I129 Hypertensive chronic kidney disease with stage 1 through stage 4 chronic kidney disease, or unspecified chronic kidney disease: Secondary | ICD-10-CM | POA: Diagnosis not present

## 2021-07-02 DIAGNOSIS — N183 Chronic kidney disease, stage 3 unspecified: Secondary | ICD-10-CM | POA: Diagnosis not present

## 2021-07-02 DIAGNOSIS — E113513 Type 2 diabetes mellitus with proliferative diabetic retinopathy with macular edema, bilateral: Secondary | ICD-10-CM | POA: Diagnosis not present

## 2021-07-02 DIAGNOSIS — I110 Hypertensive heart disease with heart failure: Secondary | ICD-10-CM | POA: Diagnosis not present

## 2021-07-02 DIAGNOSIS — Z9841 Cataract extraction status, right eye: Secondary | ICD-10-CM | POA: Diagnosis not present

## 2021-07-02 DIAGNOSIS — E1139 Type 2 diabetes mellitus with other diabetic ophthalmic complication: Secondary | ICD-10-CM | POA: Diagnosis not present

## 2021-07-02 DIAGNOSIS — H4010X3 Unspecified open-angle glaucoma, severe stage: Secondary | ICD-10-CM | POA: Diagnosis not present

## 2021-07-04 DIAGNOSIS — Z992 Dependence on renal dialysis: Secondary | ICD-10-CM | POA: Diagnosis not present

## 2021-07-04 DIAGNOSIS — N186 End stage renal disease: Secondary | ICD-10-CM | POA: Diagnosis not present

## 2021-07-05 DIAGNOSIS — N186 End stage renal disease: Secondary | ICD-10-CM | POA: Diagnosis not present

## 2021-07-05 DIAGNOSIS — Z992 Dependence on renal dialysis: Secondary | ICD-10-CM | POA: Diagnosis not present

## 2021-07-08 DIAGNOSIS — N186 End stage renal disease: Secondary | ICD-10-CM | POA: Diagnosis not present

## 2021-07-08 DIAGNOSIS — Z992 Dependence on renal dialysis: Secondary | ICD-10-CM | POA: Diagnosis not present

## 2021-07-09 DIAGNOSIS — N186 End stage renal disease: Secondary | ICD-10-CM | POA: Diagnosis not present

## 2021-07-09 DIAGNOSIS — Z992 Dependence on renal dialysis: Secondary | ICD-10-CM | POA: Diagnosis not present

## 2021-07-10 DIAGNOSIS — Z992 Dependence on renal dialysis: Secondary | ICD-10-CM | POA: Diagnosis not present

## 2021-07-10 DIAGNOSIS — N186 End stage renal disease: Secondary | ICD-10-CM | POA: Diagnosis not present

## 2021-07-11 DIAGNOSIS — E782 Mixed hyperlipidemia: Secondary | ICD-10-CM | POA: Diagnosis not present

## 2021-07-11 DIAGNOSIS — E1165 Type 2 diabetes mellitus with hyperglycemia: Secondary | ICD-10-CM | POA: Diagnosis not present

## 2021-07-11 DIAGNOSIS — D631 Anemia in chronic kidney disease: Secondary | ICD-10-CM | POA: Diagnosis not present

## 2021-07-12 DIAGNOSIS — N186 End stage renal disease: Secondary | ICD-10-CM | POA: Diagnosis not present

## 2021-07-12 DIAGNOSIS — Z992 Dependence on renal dialysis: Secondary | ICD-10-CM | POA: Diagnosis not present

## 2021-07-12 DIAGNOSIS — N2581 Secondary hyperparathyroidism of renal origin: Secondary | ICD-10-CM | POA: Diagnosis not present

## 2021-07-15 DIAGNOSIS — N2581 Secondary hyperparathyroidism of renal origin: Secondary | ICD-10-CM | POA: Diagnosis not present

## 2021-07-15 DIAGNOSIS — Z992 Dependence on renal dialysis: Secondary | ICD-10-CM | POA: Diagnosis not present

## 2021-07-15 DIAGNOSIS — N186 End stage renal disease: Secondary | ICD-10-CM | POA: Diagnosis not present

## 2021-07-16 DIAGNOSIS — Z0001 Encounter for general adult medical examination with abnormal findings: Secondary | ICD-10-CM | POA: Diagnosis not present

## 2021-07-16 DIAGNOSIS — E782 Mixed hyperlipidemia: Secondary | ICD-10-CM | POA: Diagnosis not present

## 2021-07-16 DIAGNOSIS — R109 Unspecified abdominal pain: Secondary | ICD-10-CM | POA: Diagnosis not present

## 2021-07-16 DIAGNOSIS — F319 Bipolar disorder, unspecified: Secondary | ICD-10-CM | POA: Diagnosis not present

## 2021-07-16 DIAGNOSIS — E1165 Type 2 diabetes mellitus with hyperglycemia: Secondary | ICD-10-CM | POA: Diagnosis not present

## 2021-07-16 DIAGNOSIS — I10 Essential (primary) hypertension: Secondary | ICD-10-CM | POA: Diagnosis not present

## 2021-07-16 DIAGNOSIS — K5909 Other constipation: Secondary | ICD-10-CM | POA: Diagnosis not present

## 2021-07-16 DIAGNOSIS — D631 Anemia in chronic kidney disease: Secondary | ICD-10-CM | POA: Diagnosis not present

## 2021-07-16 DIAGNOSIS — H409 Unspecified glaucoma: Secondary | ICD-10-CM | POA: Diagnosis not present

## 2021-07-17 DIAGNOSIS — Z992 Dependence on renal dialysis: Secondary | ICD-10-CM | POA: Diagnosis not present

## 2021-07-17 DIAGNOSIS — N186 End stage renal disease: Secondary | ICD-10-CM | POA: Diagnosis not present

## 2021-07-17 DIAGNOSIS — N2581 Secondary hyperparathyroidism of renal origin: Secondary | ICD-10-CM | POA: Diagnosis not present

## 2021-07-19 DIAGNOSIS — N2581 Secondary hyperparathyroidism of renal origin: Secondary | ICD-10-CM | POA: Diagnosis not present

## 2021-07-19 DIAGNOSIS — Z992 Dependence on renal dialysis: Secondary | ICD-10-CM | POA: Diagnosis not present

## 2021-07-19 DIAGNOSIS — N186 End stage renal disease: Secondary | ICD-10-CM | POA: Diagnosis not present

## 2021-07-21 ENCOUNTER — Emergency Department (HOSPITAL_COMMUNITY)
Admission: EM | Admit: 2021-07-21 | Discharge: 2021-07-21 | Disposition: A | Discharge status: Home / Self Care | Payer: Medicare HMO | Attending: Emergency Medicine | Admitting: Emergency Medicine

## 2021-07-21 ENCOUNTER — Other Ambulatory Visit: Payer: Self-pay

## 2021-07-21 DIAGNOSIS — Z7984 Long term (current) use of oral hypoglycemic drugs: Secondary | ICD-10-CM | POA: Insufficient documentation

## 2021-07-21 DIAGNOSIS — E1140 Type 2 diabetes mellitus with diabetic neuropathy, unspecified: Secondary | ICD-10-CM | POA: Insufficient documentation

## 2021-07-21 DIAGNOSIS — E1122 Type 2 diabetes mellitus with diabetic chronic kidney disease: Secondary | ICD-10-CM | POA: Diagnosis not present

## 2021-07-21 DIAGNOSIS — I132 Hypertensive heart and chronic kidney disease with heart failure and with stage 5 chronic kidney disease, or end stage renal disease: Secondary | ICD-10-CM | POA: Diagnosis not present

## 2021-07-21 DIAGNOSIS — Z794 Long term (current) use of insulin: Secondary | ICD-10-CM | POA: Diagnosis not present

## 2021-07-21 DIAGNOSIS — Z8616 Personal history of COVID-19: Secondary | ICD-10-CM | POA: Diagnosis not present

## 2021-07-21 DIAGNOSIS — K3184 Gastroparesis: Secondary | ICD-10-CM | POA: Diagnosis not present

## 2021-07-21 DIAGNOSIS — R112 Nausea with vomiting, unspecified: Secondary | ICD-10-CM

## 2021-07-21 DIAGNOSIS — N186 End stage renal disease: Secondary | ICD-10-CM | POA: Insufficient documentation

## 2021-07-21 DIAGNOSIS — Z992 Dependence on renal dialysis: Secondary | ICD-10-CM | POA: Insufficient documentation

## 2021-07-21 DIAGNOSIS — Z79899 Other long term (current) drug therapy: Secondary | ICD-10-CM | POA: Insufficient documentation

## 2021-07-21 DIAGNOSIS — I509 Heart failure, unspecified: Secondary | ICD-10-CM | POA: Insufficient documentation

## 2021-07-21 LAB — COMPREHENSIVE METABOLIC PANEL WITH GFR
ALT: 14 U/L (ref 0–44)
AST: 20 U/L (ref 15–41)
Albumin: 4 g/dL (ref 3.5–5.0)
Alkaline Phosphatase: 57 U/L (ref 38–126)
Anion gap: 11 (ref 5–15)
BUN: 61 mg/dL — ABNORMAL HIGH (ref 6–20)
CO2: 25 mmol/L (ref 22–32)
Calcium: 9.1 mg/dL (ref 8.9–10.3)
Chloride: 100 mmol/L (ref 98–111)
Creatinine, Ser: 7.05 mg/dL — ABNORMAL HIGH (ref 0.61–1.24)
GFR, Estimated: 9 mL/min — ABNORMAL LOW
Glucose, Bld: 170 mg/dL — ABNORMAL HIGH (ref 70–99)
Potassium: 3.7 mmol/L (ref 3.5–5.1)
Sodium: 136 mmol/L (ref 135–145)
Total Bilirubin: 0.7 mg/dL (ref 0.3–1.2)
Total Protein: 8 g/dL (ref 6.5–8.1)

## 2021-07-21 LAB — CBC
HCT: 38.1 % — ABNORMAL LOW (ref 39.0–52.0)
Hemoglobin: 13.1 g/dL (ref 13.0–17.0)
MCH: 35.1 pg — ABNORMAL HIGH (ref 26.0–34.0)
MCHC: 34.4 g/dL (ref 30.0–36.0)
MCV: 102.1 fL — ABNORMAL HIGH (ref 80.0–100.0)
Platelets: 132 K/uL — ABNORMAL LOW (ref 150–400)
RBC: 3.73 MIL/uL — ABNORMAL LOW (ref 4.22–5.81)
RDW: 12.4 % (ref 11.5–15.5)
WBC: 8 K/uL (ref 4.0–10.5)
nRBC: 0 % (ref 0.0–0.2)

## 2021-07-21 LAB — LIPASE, BLOOD: Lipase: 42 U/L (ref 11–51)

## 2021-07-21 MED ORDER — HYDROMORPHONE HCL 1 MG/ML IJ SOLN
1.0000 mg | Freq: Once | INTRAMUSCULAR | Status: AC
  Administered 2021-07-21: 1 mg via INTRAVENOUS
  Filled 2021-07-21: qty 1

## 2021-07-21 MED ORDER — METOCLOPRAMIDE HCL 5 MG/ML IJ SOLN
10.0000 mg | Freq: Once | INTRAMUSCULAR | Status: AC
  Administered 2021-07-21: 10 mg via INTRAVENOUS
  Filled 2021-07-21: qty 2

## 2021-07-21 MED ORDER — SODIUM CHLORIDE 0.9 % IV BOLUS
500.0000 mL | Freq: Once | INTRAVENOUS | Status: AC
  Administered 2021-07-21: 500 mL via INTRAVENOUS

## 2021-07-21 MED ORDER — FAMOTIDINE IN NACL 20-0.9 MG/50ML-% IV SOLN
20.0000 mg | Freq: Once | INTRAVENOUS | Status: AC
  Administered 2021-07-21: 20 mg via INTRAVENOUS
  Filled 2021-07-21: qty 50

## 2021-07-21 NOTE — ED Triage Notes (Signed)
Pt hx of gastroparesis. Abd pain and vomiting started yesterday

## 2021-07-21 NOTE — Discharge Instructions (Signed)
You were seen in the emergency department for vomiting and abdominal pain.  Your symptoms improved with some medication.  Please follow-up with your primary care doctor.  Return to the emergency department if any worsening or concerning symptoms

## 2021-07-21 NOTE — ED Notes (Signed)
Fluid challenge completed with out difficulty  

## 2021-07-21 NOTE — ED Provider Notes (Signed)
[facility] EMERGENCY DEPARTMENT Provider Note   CSN: [id number] Arrival date & time: 07/21/21  2007     History Chief Complaint  Patient presents with   Abdominal Pain   Emesis    X 1 day    Timothy Clark is a 50 y.o. male.  He is here with a complaint of abdominal pain nausea and vomiting that started yesterday.  Has had similar episodes in the past and was diagnosed with gastroparesis.  He says this is typical and nothing else different about it.  No blood in the vomit.  No fevers or chills shortness of breath.  No diarrhea.  He said his blood sugars have been running in the 130s.  History of end-stage renal disease dialysis Monday Wednesday Friday  The history is provided by the patient.  Abdominal Pain Pain location:  Epigastric Pain quality: stabbing   Pain severity:  Severe Onset quality:  Gradual Duration:  1 day Timing:  Constant Progression:  Worsening Chronicity:  Recurrent Context: not trauma   Relieved by:  Nothing Worsened by:  Vomiting Ineffective treatments:  None tried Associated symptoms: nausea and vomiting   Associated symptoms: no chest pain, no constipation, no cough, no diarrhea, no dysuria, no fever, no hematemesis, no shortness of breath and no sore throat   Emesis Associated symptoms: abdominal pain   Associated symptoms: no cough, no diarrhea, no fever, no headaches and no sore throat       Past Medical History:  Diagnosis Date   Anemia    Anxiety    CHF (congestive heart failure) (HCC)    diastolic dysfunction   Chronic abdominal pain    COVID-19 07/2019   Depression    Esophagitis    ESRD on hemodialysis ([place])    undergoing evaluation for transplant   Eye hemorrhage    Gastritis    Gastroparesis    GERD (gastroesophageal reflux disease)    Glaucoma    Helicobacter pylori (H. pylori) infection 2016   ERADICATION DOCUMENTED VIA STOOL TEST in AUG 2016 at [facility]   Hiccups    Hypertension    Nausea and vomiting    chronic,  recurrent   Neuropathy    feet   Renal insufficiency    Type 2 diabetes mellitus with complications ([name])    diagnosed around age 39   Vitreous hemorrhage of left eye ([place]) 12/15/2019    Patient Active Problem List   Diagnosis Date Noted   Snores 05/30/2021   Primary open angle glaucoma of both eyes, severe stage 05/30/2021   Seizures ([place]) 01/02/2021   Seizure ([place]) 01/02/2021   Problem with vascular access 09/17/2020   Bipolar disorder ([place]) 09/17/2020   Acute encephalopathy 07/24/2020   Intractable abdominal pain 07/23/2020   Encephalopathy    Colon cancer screening 07/12/2020   Optic atrophy, left eye 07/03/2020   Stable treated proliferative diabetic retinopathy of right eye determined by examination associated with type 2 diabetes mellitus ([place]) 07/03/2020   Anxiety 03/13/2020   History of COVID-19 03/13/2020   History of gastroesophageal reflux (GERD) 40/98/1191   History of Helicobacter pylori infection 03/13/2020   Pre-transplant evaluation for kidney transplant 03/13/2020   Renovascular hypertension 03/13/2020   Hematemesis 03/02/2020   Coffee ground emesis 47/82/9562   Complication of vascular access for dialysis 01/29/2020   Follow-up examination after eye surgery 12/15/2019   Type 2 diabetes with nephropathy ([place]) 12/15/2019   Stable treated proliferative diabetic retinopathy of left eye determined by examination associated with type  2 diabetes mellitus ([place]) 12/15/2019   Secondary glaucoma due to combination mechanisms, left, severe stage 12/15/2019   Nausea with vomiting 12/08/2019   Hyperglycemia due to type 2 diabetes mellitus ([place]) 10/28/2019   DKA (diabetic ketoacidoses) 10/27/2019   ESRD on dialysis ([place]) 06/26/2019   Constipation 03/15/2019   Type 2 diabetes mellitus with proliferative retinopathy ([place]) 10/25/2018   Extravasation injury of IV catheter site with other complication, initial encounter ([place])    Intractable vomiting    Chronic kidney disease     Poor venous access    Gastroparesis 10/24/2018   Closed displaced fracture of lateral end of right clavicle 06/18/2018   HTN (hypertension), malignant 06/13/2018   Scrotal swelling 11/10/2017   Swelling 11/09/2017   Scrotal edema    Obesity, Class III, BMI 40-49.9 (morbid obesity) ([place]) 11/02/2017   Wound eschars of foot, right  10/30/2017   Chronic abdominal pain 09/21/2017   Hypertensive urgency 09/21/2017   Prolonged QT interval 09/21/2017   GERD (gastroesophageal reflux disease) 07/14/2017   Glaucoma 07/14/2017   Anemia 07/14/2017   Pressure injury of skin 02/01/2017   Insulin dependent diabetes mellitus 09/07/2016   Orthostatic syncope 09/05/2016   Dumping syndrome 04/22/2016   Gastroparesis due to DM ([place]) 02/06/2015   Essential hypertension 12/06/2014   Diabetic macular edema ([place]) 02/18/2014   PDR (proliferative diabetic retinopathy) ([place]) 02/06/2014   Neovascular glaucoma of left eye 01/11/2014   Diabetic infection of left heel ([place]) 02/14/2010   UNSPECIFIED PERIPHERAL VASCULAR DISEASE 10/19/2009   ERECTILE DYSFUNCTION, ORGANIC 10/19/2009   NUMBNESS 07/20/2009   Type 2 diabetes mellitus with ESRD (end-stage renal disease) ([place]) 03/22/2009   Hyperlipidemia 03/22/2009   Depression 03/22/2009    Past Surgical History:  Procedure Laterality Date   AMPUTATION TOE Right    great toe   AV FISTULA INSERTION W/ RF MAGNETIC GUIDANCE Left 04/06/2019   Procedure: AV FISTULA INSERTION W/RF MAGNETIC GUIDANCE;  Surgeon: Katha Cabal, MD;  Location: [place] [facility];  Service: Cardiovascular;  Laterality: Left;   AV FISTULA PLACEMENT Left 05/06/2019   Procedure: INSERTION OF ARTERIOVENOUS (AV) GORE-TEX GRAFT ARM ( FOREARM LOOP );  Surgeon: Katha Cabal, MD;  Location: [facility];  Service: Vascular;  Laterality: Left;   AV FISTULA PLACEMENT Left 03/21/2020   Procedure: INSERTION OF ARTERIOVENOUS (AV) GORE-TEX GRAFT ARM ( BRACHIAL AXILLARY );  Surgeon: Katha Cabal, MD;  Location: [facility];  Service: Vascular;  Laterality: Left;   CATARACT EXTRACTION W/PHACO Left 05/19/2016   Procedure: CATARACT EXTRACTION PHACO AND INTRAOCULAR LENS PLACEMENT LEFT EYE;  Surgeon: Tonny Branch, MD;  Location: [facility];  Service: Ophthalmology;  Laterality: Left;  CDE: 7.30   CATARACT EXTRACTION W/PHACO Right 06/23/2016   Procedure: CATARACT EXTRACTION PHACO AND INTRAOCULAR LENS PLACEMENT RIGHT EYE CDE=9.87;  Surgeon: Tonny Branch, MD;  Location: [facility];  Service: Ophthalmology;  Laterality: Right;  right   DIALYSIS/PERMA CATHETER REMOVAL N/A 07/19/2019   Procedure: DIALYSIS/PERMA CATHETER REMOVAL;  Surgeon: Katha Cabal, MD;  Location: [place] [facility];  Service: Cardiovascular;  Laterality: N/A;   ESOPHAGOGASTRODUODENOSCOPY  2015   Dr. Britta Mccreedy   ESOPHAGOGASTRODUODENOSCOPY N/A 10/26/2014   R[facility]: Distal esophagititis-likely reflux related although an element of pill induced injuruy no exclueded  status post biopsy. Diffusely abnormal gastric mucosa of uncertain signigicane -status post gastric biopsy. Focal area of excoriation in the cardia most consistant with a trauma of heaving.    ESOPHAGOGASTRODUODENOSCOPY  08/2015   [facility]: mild esophagitis and old  gastric contents   EYE SURGERY Left 2015   stent placed to left eye   EYE SURGERY Right 06/2019   per patient- to remove scar tissue   INCISION AND DRAINAGE OF WOUND Right 07/16/2017   Procedure: IRRIGATION AND DEBRIDEMENT OF SOFT TISSUE OF ULCERATION RIGHT FOOT;  Surgeon: Caprice Beaver, DPM;  Location: [facility];  Service: Podiatry;  Laterality: Right;   IR THROMBECTOMY AV FISTULA W/THROMBOLYSIS/PTA INC/SHUNT/IMG LEFT Left 09/19/2020   IR THROMBECTOMY AV FISTULA W/THROMBOLYSIS/PTA INC/SHUNT/IMG LEFT Left 05/28/2021   IR US GUIDE VASC ACCESS LEFT  09/19/2020   IR US GUIDE VASC ACCESS LEFT  05/28/2021   PERIPHERAL VASCULAR THROMBECTOMY Left 09/27/2019   Procedure: PERIPHERAL VASCULAR THROMBECTOMY;  Surgeon: Katha Cabal, MD;  Location: [place] [facility];  Service: Cardiovascular;  Laterality: Left;   PERIPHERAL VASCULAR THROMBECTOMY Left 10/27/2019   Procedure: PERIPHERAL VASCULAR THROMBECTOMY;  Surgeon: Algernon Huxley, MD;  Location: [place] [facility];  Service: Cardiovascular;  Laterality: Left;   PERIPHERAL VASCULAR THROMBECTOMY Left 12/13/2019   Procedure: PERIPHERAL VASCULAR THROMBECTOMY;  Surgeon: Katha Cabal, MD;  Location: [place] [facility];  Service: Cardiovascular;  Laterality: Left;       Family History  Problem Relation Age of Onset   Ovarian cancer Mother    Heart attack Father    Colon polyps Father    Cervical cancer Sister    Diabetes Sister    Colon cancer Neg Hx     Social History   Tobacco Use   Smoking status: Never   Smokeless tobacco: Never  Vaping Use   Vaping Use: Never used  Substance Use Topics   Alcohol use: No    Alcohol/week: 0.0 standard drinks   Drug use: No    Home Medications Prior to Admission medications   Medication Sig Start Date End Date Taking? Authorizing Provider  ALPRAZolam Duanne Moron) 0.5 MG tablet Take 0.5 mg by mouth 2 (two) times daily as needed for anxiety.    [provider]  amLODipine (NORVASC) 5 MG tablet Take 5 mg by mouth daily. Patient not taking: Reported on 06/22/2021    [provider]  atorvastatin (LIPITOR) 10 MG tablet Take 1 tablet (10 mg total) by mouth at bedtime. 10/13/20 06/22/21  Johnson, Clanford L, MD  brimonidine (ALPHAGAN) 0.15 % ophthalmic solution Place 1 drop into both eyes in the morning and at bedtime.     [provider]  dicyclomine (BENTYL) 20 MG tablet Take 1 tablet (20 mg total) by mouth 2 (two) times daily. Patient taking differently: Take 20 mg by mouth 2 (two) times daily as needed for spasms. 05/12/21   Jacqlyn Larsen, PA-C  divalproex (DEPAKOTE) 250 MG DR tablet Take 750 mg by mouth 3 (three) times daily.    [provider]  dorzolamide-timolol (COSOPT) 22.3-6.8  MG/ML ophthalmic solution Place 1 drop into both eyes 2 (two) times daily.  01/16/20   [provider]  famotidine (PEPCID) 20 MG tablet Take 1 tablet (20 mg total) by mouth daily. 05/28/21   Dwyane Dee, MD  folic acid (FOLVITE) 1 MG tablet Take 1 mg by mouth daily.    [provider]  gentamicin cream (GARAMYCIN) 0.1 % Apply 1 application topically 2 (two) times daily. Patient taking differently: Apply 1 application topically at bedtime. 12/12/20   Edrick Kins, DPM  Glucagon (GVOKE HYPOPEN 2-PACK) 1 MG/0.2ML SOAJ Inject 1 Syringe into the skin once as needed (hypoglycemia).    [provider]  Iron-FA-B Cmp-C-Biot-Probiotic (FUSION PLUS PO) Take 1 capsule by mouth daily.    [provider]  LEADER UNIFINE PENTIPS PLUS 32G X 4 MM MISC  05/17/20   [provider]  metoCLOPramide (REGLAN) 10 MG tablet Take 1 tablet (10 mg total) by mouth every 6 (six) hours. 05/12/21   Jacqlyn Larsen, PA-C  NOVOLOG MIX 70/30 FLEXPEN (70-30) 100 UNIT/ML FlexPen Inject 25-35 Units into the skin See admin instructions. Inject 35 units in the morning and 25 every evening - Sliding scale 02/06/21   [provider]  ondansetron (ZOFRAN-ODT) 4 MG disintegrating tablet Take 4 mg by mouth every 8 (eight) hours as needed for nausea or vomiting.    [provider]  pantoprazole (PROTONIX) 40 MG tablet Take 40 mg by mouth daily. 06/11/21   [provider]  polyethylene glycol (MIRALAX / GLYCOLAX) 17 g packet Take 17 g by mouth daily as needed for mild constipation. Patient taking differently: Take 17 g by mouth daily as needed for moderate constipation. 10/13/20   Johnson, Clanford L, MD  senna-docusate (SENOKOT-S) 8.6-50 MG tablet Take 1 tablet by mouth 2 (two) times daily. Patient not taking: No sig reported 10/13/20   Wynetta Emery, Clanford L, MD  sitaGLIPtin (JANUVIA) 25 MG tablet Take 25 mg by mouth daily.     [provider]  sucralfate (CARAFATE) 1 g  tablet Take 1 g by mouth 4 (four) times daily.    [provider]  torsemide (DEMADEX) 20 MG tablet Take 40 mg by mouth daily. At noon    [provider]  ziprasidone (GEODON) 20 MG capsule Take 1 tab with breakfast and 2 tabs at HS Patient taking differently: Take 20-40 mg by mouth See admin instructions. Take 20 mg capsule with breakfast and 40 mg at bedtime 10/13/20   Murlean Iba, MD    Allergies    Donnatal [phenobarbital-belladonna alk], Fish allergy, Haldol [haloperidol lactate], Lidocaine, Maalox [calcium carbonate antacid], Shellfish allergy, Aspirin, Doxycycline, Keflex [cephalexin], Marinol [dronabinol], Other, Penicillins, Tramadol, and Bactrim [sulfamethoxazole-trimethoprim]  Review of Systems   Review of Systems  Constitutional:  Negative for fever.  HENT:  Negative for sore throat.   Eyes:  Negative for visual disturbance.  Respiratory:  Negative for cough and shortness of breath.   Cardiovascular:  Negative for chest pain.  Gastrointestinal:  Positive for abdominal pain, nausea and vomiting. Negative for constipation, diarrhea and hematemesis.  Genitourinary:  Negative for dysuria.  Musculoskeletal:  Negative for neck pain.  Skin:  Negative for rash.  Neurological:  Negative for headaches.   Physical Exam Updated Vital Signs BP (!) 172/87 (BP Location: Right Arm)   Pulse 89   Temp 98.5 F (36.9 C) (Oral)   Resp 18   Ht 6' (1.829 m)   Wt 122.5 kg   SpO2 100%   BMI 36.62 kg/m   Physical Exam Vitals and nursing note reviewed.  Constitutional:      General: He is not in acute distress.    Appearance: Normal appearance. He is well-developed.  HENT:     Head: Normocephalic and atraumatic.  Eyes:     Conjunctiva/sclera: Conjunctivae normal.  Cardiovascular:     Rate and Rhythm: Normal rate and regular rhythm.     Heart sounds: No murmur heard. Pulmonary:     Effort: Pulmonary effort is normal. No respiratory distress.     Breath sounds:  Normal breath sounds.  Abdominal:     Palpations: Abdomen is soft.  Tenderness: There is abdominal tenderness in the epigastric area.  Musculoskeletal:        General: No swelling.     Cervical back: Neck supple.  Skin:    General: Skin is warm and dry.     Capillary Refill: Capillary refill takes less than 2 seconds.  Neurological:     General: No focal deficit present.     Mental Status: He is alert.  Psychiatric:        Mood and Affect: Mood normal.    ED Results / Procedures / Treatments   Labs (all labs ordered are listed, but only abnormal results are displayed) Labs Reviewed  COMPREHENSIVE METABOLIC PANEL - Abnormal; Notable for the following components:      Result Value   Glucose, Bld 170 (*)    BUN 61 (*)    Creatinine, Ser 7.05 (*)    GFR, Estimated 9 (*)    All other components within normal limits  CBC - Abnormal; Notable for the following components:   RBC 3.73 (*)    HCT 38.1 (*)    MCV 102.1 (*)    MCH 35.1 (*)    Platelets 132 (*)    All other components within normal limits  LIPASE, BLOOD    EKG None  Radiology No results found.  Procedures Procedures   Medications Ordered in ED Medications  HYDROmorphone (DILAUDID) injection 1 mg (has no administration in time range)  metoCLOPramide (REGLAN) injection 10 mg (has no administration in time range)  sodium chloride 0.9 % bolus 500 mL (has no administration in time range)    ED Course  I have reviewed the triage vital signs and the nursing notes.  Pertinent labs & imaging results that were available during my care of the patient were reviewed by me and considered in my medical decision making (see chart for details).  Clinical Course as of 07/22/21 1018  Sun Jul 21, 2021  2154 Patient looks much more comfortable talking on phone. [MB]  2254 Patient states he feels better now and is comfortable with plan for discharge.  Recommended following up with his treating providers. [MB]     Clinical Course User Index [MB] Hayden Rasmussen, MD   MDM Rules/Calculators/A&P                          This patient complains of nausea and vomiting abdominal pain consistent similar to prior episodes of gastroparesis; this involves an extensive number of treatment Options and is a complaint that carries with it a high risk of complications and Morbidity. The differential includes gastroparesis, peptic ulcer disease, gastritis, dehydration, metabolic derangement  I ordered, reviewed and interpreted labs, which included CBC with normal white count stable hemoglobin, chemistries with elevated glucose elevated BUN/creatinine I ordered medication IV fluids pain medication nausea medication, IV Pepcid  Previous records obtained and reviewed in epic including prior similar episodes in the past  After the interventions stated above, I reevaluated the patient and found patient's symptoms to be controlled.  Do not feel needs imaging at the current time as has had similar presentations in the past.  He is comfortable plan for discharge.  Recommended close follow-up with his treating providers and return instructions discussed   Final Clinical Impression(s) / ED Diagnoses Final diagnoses:  Nausea and vomiting, unspecified vomiting type  Gastroparesis    Rx / DC Orders ED Discharge Orders     None  Hayden Rasmussen, MD 07/22/21 1020

## 2021-07-22 DIAGNOSIS — N186 End stage renal disease: Secondary | ICD-10-CM | POA: Diagnosis not present

## 2021-07-22 DIAGNOSIS — Z992 Dependence on renal dialysis: Secondary | ICD-10-CM | POA: Diagnosis not present

## 2021-07-22 DIAGNOSIS — N2581 Secondary hyperparathyroidism of renal origin: Secondary | ICD-10-CM | POA: Diagnosis not present

## 2021-07-23 ENCOUNTER — Emergency Department (HOSPITAL_COMMUNITY)
Admission: EM | Admit: 2021-07-23 | Discharge: 2021-07-23 | Disposition: A | Discharge status: Home / Self Care | Payer: Medicare HMO | Attending: Emergency Medicine | Admitting: Emergency Medicine

## 2021-07-23 ENCOUNTER — Encounter (HOSPITAL_COMMUNITY): Payer: Self-pay | Admitting: *Deleted

## 2021-07-23 DIAGNOSIS — R109 Unspecified abdominal pain: Secondary | ICD-10-CM

## 2021-07-23 DIAGNOSIS — E1121 Type 2 diabetes mellitus with diabetic nephropathy: Secondary | ICD-10-CM | POA: Insufficient documentation

## 2021-07-23 DIAGNOSIS — G8929 Other chronic pain: Secondary | ICD-10-CM | POA: Diagnosis not present

## 2021-07-23 DIAGNOSIS — E113551 Type 2 diabetes mellitus with stable proliferative diabetic retinopathy, right eye: Secondary | ICD-10-CM | POA: Insufficient documentation

## 2021-07-23 DIAGNOSIS — Z7984 Long term (current) use of oral hypoglycemic drugs: Secondary | ICD-10-CM | POA: Diagnosis not present

## 2021-07-23 DIAGNOSIS — K3184 Gastroparesis: Secondary | ICD-10-CM | POA: Diagnosis not present

## 2021-07-23 DIAGNOSIS — I132 Hypertensive heart and chronic kidney disease with heart failure and with stage 5 chronic kidney disease, or end stage renal disease: Secondary | ICD-10-CM | POA: Diagnosis not present

## 2021-07-23 DIAGNOSIS — I509 Heart failure, unspecified: Secondary | ICD-10-CM | POA: Diagnosis not present

## 2021-07-23 DIAGNOSIS — Z8616 Personal history of COVID-19: Secondary | ICD-10-CM | POA: Diagnosis not present

## 2021-07-23 DIAGNOSIS — E1143 Type 2 diabetes mellitus with diabetic autonomic (poly)neuropathy: Secondary | ICD-10-CM | POA: Insufficient documentation

## 2021-07-23 DIAGNOSIS — Z992 Dependence on renal dialysis: Secondary | ICD-10-CM | POA: Diagnosis not present

## 2021-07-23 DIAGNOSIS — N186 End stage renal disease: Secondary | ICD-10-CM | POA: Diagnosis not present

## 2021-07-23 DIAGNOSIS — Z794 Long term (current) use of insulin: Secondary | ICD-10-CM | POA: Insufficient documentation

## 2021-07-23 MED ORDER — DICYCLOMINE HCL 10 MG/ML IM SOLN
20.0000 mg | Freq: Once | INTRAMUSCULAR | Status: AC
  Administered 2021-07-23: 20 mg via INTRAMUSCULAR
  Filled 2021-07-23: qty 2

## 2021-07-23 MED ORDER — DICYCLOMINE HCL 20 MG PO TABS: 20.0000 mg | ORAL_TABLET | Freq: Two times a day (BID) | ORAL | 0 refills | Status: AC | PRN

## 2021-07-23 NOTE — ED Notes (Signed)
Pt in hallway. Pt unable to be redirected by 3 RNs and a NT back to room. Pt states he does not want to leave, but does not want to stay in his room. Attempted to explain to pt that it was unsafe for him and others for him to be in the hallway. Pt started breathing heavily and resisting staff. Pt did agree to sit in bed to receive Bentyl injection Security and [employer] notified and at bedside. Dr. Dina Rich aware.

## 2021-07-23 NOTE — ED Triage Notes (Signed)
States medication for abdominal pain is on back order

## 2021-07-23 NOTE — ED Notes (Signed)
Pt assisted to wheelchair by security and [employer]. Pt's father notified to come pick up patient.

## 2021-07-23 NOTE — ED Triage Notes (Signed)
Abdominal pain onset today, seen 2 days ago for same. Patient will not lie down

## 2021-07-23 NOTE — ED Provider Notes (Signed)
[facility] Provider Note   CSN: [id number] Arrival date & time: 07/23/21  1108     History Chief Complaint  Patient presents with   Abdominal Pain    Timothy Clark is a 50 y.o. male with history of CHF, ESRD on hemodialysis, type 2 diabetes and chronic abdominal pain who presents to the emergency department complaining of persistent abdominal pain onset of last night.  Patient was seen on 12/11 for similar symptoms, his pain was treated and he was discharged in stable condition.  He states that he is normally on Bentyl for gastroparesis, but the Acacian has been on backorder at the pharmacy and he cannot obtain it.  He states that he has not had anything to eat for the past 2 days, but has been tolerating fluids well.  1 episode of emesis prior to arrival, continues to feel nauseous at this time.  No diarrhea or constipation.  Last bowel movement was this morning and was normal for him.   Abdominal Pain Associated symptoms: no chest pain, no chills, no constipation, no diarrhea, no fever, no nausea, no shortness of breath and no vomiting       Past Medical History:  Diagnosis Date   Anemia    Anxiety    CHF (congestive heart failure) (HCC)    diastolic dysfunction   Chronic abdominal pain    COVID-19 07/2019   Depression    Esophagitis    ESRD on hemodialysis ([place])    undergoing evaluation for transplant   Eye hemorrhage    Gastritis    Gastroparesis    GERD (gastroesophageal reflux disease)    Glaucoma    Helicobacter pylori (H. pylori) infection 2016   ERADICATION DOCUMENTED VIA STOOL TEST in AUG 2016 at [facility]   Hiccups    Hypertension    Nausea and vomiting    chronic, recurrent   Neuropathy    feet   Renal insufficiency    Type 2 diabetes mellitus with complications ([place])    diagnosed around age 76   Vitreous hemorrhage of left eye ([place]) 12/15/2019    Patient Active Problem List   Diagnosis Date Noted   Snores 05/30/2021   Primary  open angle glaucoma of both eyes, severe stage 05/30/2021   Seizures ([place]) 01/02/2021   Seizure ([place]) 01/02/2021   Problem with vascular access 09/17/2020   Bipolar disorder ([place]) 09/17/2020   Acute encephalopathy 07/24/2020   Intractable abdominal pain 07/23/2020   Encephalopathy    Colon cancer screening 07/12/2020   Optic atrophy, left eye 07/03/2020   Stable treated proliferative diabetic retinopathy of right eye determined by examination associated with type 2 diabetes mellitus ([place]) 07/03/2020   Anxiety 03/13/2020   History of COVID-19 03/13/2020   History of gastroesophageal reflux (GERD) 08/65/7846   History of Helicobacter pylori infection 03/13/2020   Pre-transplant evaluation for kidney transplant 03/13/2020   Renovascular hypertension 03/13/2020   Hematemesis 03/02/2020   Coffee ground emesis 96/29/5284   Complication of vascular access for dialysis 01/29/2020   Follow-up examination after eye surgery 12/15/2019   Type 2 diabetes with nephropathy ([place]) 12/15/2019   Stable treated proliferative diabetic retinopathy of left eye determined by examination associated with type 2 diabetes mellitus ([place]) 12/15/2019   Secondary glaucoma due to combination mechanisms, left, severe stage 12/15/2019   Nausea with vomiting 12/08/2019   Hyperglycemia due to type 2 diabetes mellitus ([place]) 10/28/2019   DKA (diabetic ketoacidoses) 10/27/2019   ESRD on dialysis ([place]) 06/26/2019  Constipation 03/15/2019   Type 2 diabetes mellitus with proliferative retinopathy ([place]) 10/25/2018   Extravasation injury of IV catheter site with other complication, initial encounter ([place])    Intractable vomiting    Chronic kidney disease    Poor venous access    Gastroparesis 10/24/2018   Closed displaced fracture of lateral end of right clavicle 06/18/2018   HTN (hypertension), malignant 06/13/2018   Scrotal swelling 11/10/2017   Swelling 11/09/2017   Scrotal edema    Obesity, Class III, BMI 40-49.9  (morbid obesity) ([place]) 11/02/2017   Wound eschars of foot, right  10/30/2017   Chronic abdominal pain 09/21/2017   Hypertensive urgency 09/21/2017   Prolonged QT interval 09/21/2017   GERD (gastroesophageal reflux disease) 07/14/2017   Glaucoma 07/14/2017   Anemia 07/14/2017   Pressure injury of skin 02/01/2017   Insulin dependent diabetes mellitus 09/07/2016   Orthostatic syncope 09/05/2016   Dumping syndrome 04/22/2016   Gastroparesis due to DM ([place]) 02/06/2015   Essential hypertension 12/06/2014   Diabetic macular edema ([place]) 02/18/2014   PDR (proliferative diabetic retinopathy) ([place]) 02/06/2014   Neovascular glaucoma of left eye 01/11/2014   Diabetic infection of left heel ([place]) 02/14/2010   UNSPECIFIED PERIPHERAL VASCULAR DISEASE 10/19/2009   ERECTILE DYSFUNCTION, ORGANIC 10/19/2009   NUMBNESS 07/20/2009   Type 2 diabetes mellitus with ESRD (end-stage renal disease) ([place]) 03/22/2009   Hyperlipidemia 03/22/2009   Depression 03/22/2009    Past Surgical History:  Procedure Laterality Date   AMPUTATION TOE Right    great toe   AV FISTULA INSERTION W/ RF MAGNETIC GUIDANCE Left 04/06/2019   Procedure: AV FISTULA INSERTION W/RF MAGNETIC GUIDANCE;  Surgeon: Katha Cabal, MD;  Location: [facility];  Service: Cardiovascular;  Laterality: Left;   AV FISTULA PLACEMENT Left 05/06/2019   Procedure: INSERTION OF ARTERIOVENOUS (AV) GORE-TEX GRAFT ARM ( FOREARM LOOP );  Surgeon: Katha Cabal, MD;  Location: [facility];  Service: Vascular;  Laterality: Left;   AV FISTULA PLACEMENT Left 03/21/2020   Procedure: INSERTION OF ARTERIOVENOUS (AV) GORE-TEX GRAFT ARM ( BRACHIAL AXILLARY );  Surgeon: Katha Cabal, MD;  Location: [facility];  Service: Vascular;  Laterality: Left;   CATARACT EXTRACTION W/PHACO Left 05/19/2016   Procedure: CATARACT EXTRACTION PHACO AND INTRAOCULAR LENS PLACEMENT LEFT EYE;  Surgeon: Tonny Branch, MD;  Location: [facility];  Service: Ophthalmology;   Laterality: Left;  CDE: 7.30   CATARACT EXTRACTION W/PHACO Right 06/23/2016   Procedure: CATARACT EXTRACTION PHACO AND INTRAOCULAR LENS PLACEMENT RIGHT EYE CDE=9.87;  Surgeon: Tonny Branch, MD;  Location: [facility];  Service: Ophthalmology;  Laterality: Right;  right   DIALYSIS/PERMA CATHETER REMOVAL N/A 07/19/2019   Procedure: DIALYSIS/PERMA CATHETER REMOVAL;  Surgeon: Katha Cabal, MD;  Location: [facility];  Service: Cardiovascular;  Laterality: N/A;   ESOPHAGOGASTRODUODENOSCOPY  2015   Dr. Britta Mccreedy   ESOPHAGOGASTRODUODENOSCOPY N/A 10/26/2014   RMR: Distal esophagititis-likely reflux related although an element of pill induced injuruy no exclueded  status post biopsy. Diffusely abnormal gastric mucosa of uncertain signigicane -status post gastric biopsy. Focal area of excoriation in the cardia most consistant with a trauma of heaving.    ESOPHAGOGASTRODUODENOSCOPY  08/2015   [facility]: mild esophagitis and old gastric contents   EYE SURGERY Left 2015   stent placed to left eye   EYE SURGERY Right 06/2019   per patient- to remove scar tissue   INCISION AND DRAINAGE OF WOUND Right 07/16/2017   Procedure: IRRIGATION AND DEBRIDEMENT OF SOFT TISSUE OF ULCERATION  RIGHT FOOT;  Surgeon: Caprice Beaver, DPM;  Location: [facility];  Service: Podiatry;  Laterality: Right;   IR THROMBECTOMY AV FISTULA W/THROMBOLYSIS/PTA INC/SHUNT/IMG LEFT Left 09/19/2020   IR THROMBECTOMY AV FISTULA W/THROMBOLYSIS/PTA INC/SHUNT/IMG LEFT Left 05/28/2021   IR US GUIDE VASC ACCESS LEFT  09/19/2020   IR US GUIDE VASC ACCESS LEFT  05/28/2021   PERIPHERAL VASCULAR THROMBECTOMY Left 09/27/2019   Procedure: PERIPHERAL VASCULAR THROMBECTOMY;  Surgeon: Katha Cabal, MD;  Location: [facility];  Service: Cardiovascular;  Laterality: Left;   PERIPHERAL VASCULAR THROMBECTOMY Left 10/27/2019   Procedure: PERIPHERAL VASCULAR THROMBECTOMY;  Surgeon: Algernon Huxley, MD;  Location: [facility];  Service:  Cardiovascular;  Laterality: Left;   PERIPHERAL VASCULAR THROMBECTOMY Left 12/13/2019   Procedure: PERIPHERAL VASCULAR THROMBECTOMY;  Surgeon: Katha Cabal, MD;  Location: [facility];  Service: Cardiovascular;  Laterality: Left;       Family History  Problem Relation Age of Onset   Ovarian cancer Mother    Heart attack Father    Colon polyps Father    Cervical cancer Sister    Diabetes Sister    Colon cancer Neg Hx     Social History   Tobacco Use   Smoking status: Never   Smokeless tobacco: Never  Vaping Use   Vaping Use: Never used  Substance Use Topics   Alcohol use: No    Alcohol/week: 0.0 standard drinks   Drug use: No    Home Medications Prior to Admission medications   Medication Sig Start Date End Date Taking? Authorizing Provider  ALPRAZolam Duanne Moron) 0.5 MG tablet Take 0.5 mg by mouth 2 (two) times daily as needed for anxiety.    [provider]  amLODipine (NORVASC) 5 MG tablet Take 5 mg by mouth daily. Patient not taking: Reported on 06/22/2021    [provider]  atorvastatin (LIPITOR) 10 MG tablet Take 1 tablet (10 mg total) by mouth at bedtime. 10/13/20 06/22/21  Johnson, Clanford L, MD  brimonidine (ALPHAGAN) 0.15 % ophthalmic solution Place 1 drop into both eyes in the morning and at bedtime.     [provider]  dicyclomine (BENTYL) 20 MG tablet Take 1 tablet (20 mg total) by mouth 2 (two) times daily as needed for spasms. 07/23/21   Tamee Battin T, PA-C  divalproex (DEPAKOTE) 250 MG DR tablet Take 750 mg by mouth 3 (three) times daily.    [provider]  dorzolamide-timolol (COSOPT) 22.3-6.8 MG/ML ophthalmic solution Place 1 drop into both eyes 2 (two) times daily.  01/16/20   [provider]  famotidine (PEPCID) 20 MG tablet Take 1 tablet (20 mg total) by mouth daily. 05/28/21   Dwyane Dee, MD  folic acid (FOLVITE) 1 MG tablet Take 1 mg by mouth daily.    [provider]  gentamicin  cream (GARAMYCIN) 0.1 % Apply 1 application topically 2 (two) times daily. Patient taking differently: Apply 1 application topically at bedtime. 12/12/20   Edrick Kins, DPM  Glucagon (GVOKE HYPOPEN 2-PACK) 1 MG/0.2ML SOAJ Inject 1 Syringe into the skin once as needed (hypoglycemia).    [provider]  Iron-FA-B Cmp-C-Biot-Probiotic (FUSION PLUS PO) Take 1 capsule by mouth daily.    [provider]  LEADER UNIFINE PENTIPS PLUS 32G X 4 MM MISC  05/17/20   [provider]  metoCLOPramide (REGLAN) 10 MG tablet Take 1 tablet (10 mg total) by mouth every 6 (six) hours. 05/12/21   Jacqlyn Larsen, PA-C  NOVOLOG MIX 70/30 FLEXPEN (70-30) 100 UNIT/ML FlexPen Inject 25-35 Units into the skin See admin instructions. Inject 35 units in the morning and 25 every evening - Sliding scale 02/06/21   [provider]  ondansetron (ZOFRAN-ODT) 4 MG disintegrating tablet Take 4 mg by mouth every 8 (eight) hours as needed for nausea or vomiting.    [provider]  pantoprazole (PROTONIX) 40 MG tablet Take 40 mg by mouth daily. 06/11/21   [provider]  polyethylene glycol (MIRALAX / GLYCOLAX) 17 g packet Take 17 g by mouth daily as needed for mild constipation. Patient taking differently: Take 17 g by mouth daily as needed for moderate constipation. 10/13/20   Johnson, Clanford L, MD  senna-docusate (SENOKOT-S) 8.6-50 MG tablet Take 1 tablet by mouth 2 (two) times daily. Patient not taking: No sig reported 10/13/20   Wynetta Emery, Clanford L, MD  sitaGLIPtin (JANUVIA) 25 MG tablet Take 25 mg by mouth daily.     [provider]  sucralfate (CARAFATE) 1 g tablet Take 1 g by mouth 4 (four) times daily.    [provider]  torsemide (DEMADEX) 20 MG tablet Take 40 mg by mouth daily. At noon    [provider]  ziprasidone (GEODON) 20 MG capsule Take 1 tab with breakfast and 2 tabs at HS Patient taking differently: Take 20-40 mg by mouth See admin  instructions. Take 20 mg capsule with breakfast and 40 mg at bedtime 10/13/20   Murlean Iba, MD    Allergies    Donnatal [phenobarbital-belladonna alk], Fish allergy, Haldol [haloperidol lactate], Lidocaine, Maalox [calcium carbonate antacid], Shellfish allergy, Aspirin, Doxycycline, Keflex [cephalexin], Marinol [dronabinol], Other, Penicillins, Tramadol, and Bactrim [sulfamethoxazole-trimethoprim]  Review of Systems   Review of Systems  Constitutional:  Negative for chills and fever.  Respiratory:  Negative for shortness of breath.   Cardiovascular:  Negative for chest pain.  Gastrointestinal:  Positive for abdominal pain. Negative for constipation, diarrhea, nausea and vomiting.  All other systems reviewed and are negative.  Physical Exam Updated Vital Signs BP (!) 193/101    Pulse 98    Temp 99 F (37.2 C)    Resp (!) 22    SpO2 98%   Physical Exam Vitals and nursing note reviewed.  Constitutional:      Appearance: Normal appearance.  HENT:     Head: Normocephalic and atraumatic.  Eyes:     Conjunctiva/sclera: Conjunctivae normal.  Cardiovascular:     Rate and Rhythm: Normal rate and regular rhythm.  Pulmonary:     Effort: Pulmonary effort is normal. No respiratory distress.     Breath sounds: Normal breath sounds.  Abdominal:     General: There is no distension.     Palpations: Abdomen is soft.     Tenderness: There is no abdominal tenderness.  Skin:    General: Skin is warm and dry.  Neurological:     General: No focal deficit present.     Mental Status: He is alert.    ED Results / Procedures / Treatments   Labs (all labs ordered are listed, but only abnormal results are displayed) Labs Reviewed - No data to display  EKG None  Radiology No results found.  Procedures Procedures   Medications Ordered in ED Medications  dicyclomine (BENTYL) injection 20 mg (20 mg Intramuscular Given 07/23/21 1216)    ED Course  I have reviewed the triage vital  signs and the nursing notes.  Pertinent labs & imaging results that were  available during my care of the patient were reviewed by me and considered in my medical decision making (see chart for details).    MDM Rules/Calculators/A&P                           Patient is 50 year old male with history of CHF, ESRD on hemodialysis, type 2 diabetes and chronic abdominal pain who presents to the emergency department complaining of persistent abdominal pain onset of last night.  Upon chart review patient was seen on 12/11 for similar symptoms, and his pain was controlled and he was discharged in stable condition.  At that time he had normal labs including CBC, CMP.  Patient states that his pain feels the same as his normal gastroparesis, and he just wants dose of his prescribed medicine.  Patient given 1 dose of Bentyl, and states that his pain improved significantly.  Patient states that his prior prescription was on backorder at the pharmacy, but would be helpful with the prescription was changed from capsules to tablets, and I have done this.  Overall patient is clinically well-appearing, he is afebrile, not tachycardic, no acute distress.  Abdomen is soft, nontender nondistended.  I have low concern for other acute intra-abdominal pathology, and symptoms follow similar pattern to his usual.  I do not think repeat lab work today would be helpful.  Since pain is well controlled, do not feel that he needs imaging at this time.  Plan to discharge to home, and given close return precautions.  Patient agreeable to plan.  Final Clinical Impression(s) / ED Diagnoses Final diagnoses:  Chronic abdominal pain  Gastroparesis    Rx / DC Orders ED Discharge Orders          Ordered    dicyclomine (BENTYL) 20 MG tablet  2 times daily PRN        07/23/21 1243           Portions of this report may have been transcribed using voice recognition software. Every effort was made to ensure accuracy; however,  inadvertent computerized transcription errors may be present.    Kateri Plummer, PA-C 07/23/21 [place], [place], DO 07/25/21 [id number]

## 2021-07-23 NOTE — ED Notes (Signed)
Discharge instructions reviewed with pt and pt's father. Pt escorted to car with [employer] and security. Unable to sign for discharge or obtain discharge vitals. See notes.

## 2021-07-23 NOTE — Discharge Instructions (Addendum)
You were seen in the emergency department today for abdominal pain.  We gave you 1 dose of your Bentyl medication via injection.  And I am attempting to refill your Bentyl tablets.  Please pick these up from the pharmacy.  Continue to monitor how you're doing and return to the ER for new or worsening symptoms such as inability to keep down medication, fevers or chills, or persistent vomiting.

## 2021-07-24 DIAGNOSIS — R109 Unspecified abdominal pain: Secondary | ICD-10-CM | POA: Diagnosis not present

## 2021-07-24 DIAGNOSIS — W19XXXA Unspecified fall, initial encounter: Secondary | ICD-10-CM | POA: Diagnosis not present

## 2021-07-24 DIAGNOSIS — E1140 Type 2 diabetes mellitus with diabetic neuropathy, unspecified: Secondary | ICD-10-CM | POA: Diagnosis not present

## 2021-07-24 DIAGNOSIS — M79603 Pain in arm, unspecified: Secondary | ICD-10-CM | POA: Diagnosis not present

## 2021-07-24 DIAGNOSIS — I120 Hypertensive chronic kidney disease with stage 5 chronic kidney disease or end stage renal disease: Secondary | ICD-10-CM | POA: Diagnosis not present

## 2021-07-24 DIAGNOSIS — M79622 Pain in left upper arm: Secondary | ICD-10-CM | POA: Diagnosis not present

## 2021-07-24 DIAGNOSIS — K3184 Gastroparesis: Secondary | ICD-10-CM | POA: Diagnosis not present

## 2021-07-24 DIAGNOSIS — I10 Essential (primary) hypertension: Secondary | ICD-10-CM | POA: Diagnosis not present

## 2021-07-24 DIAGNOSIS — R404 Transient alteration of awareness: Secondary | ICD-10-CM | POA: Diagnosis not present

## 2021-07-24 DIAGNOSIS — E11319 Type 2 diabetes mellitus with unspecified diabetic retinopathy without macular edema: Secondary | ICD-10-CM | POA: Diagnosis not present

## 2021-07-24 DIAGNOSIS — M79602 Pain in left arm: Secondary | ICD-10-CM | POA: Diagnosis not present

## 2021-07-24 DIAGNOSIS — R1084 Generalized abdominal pain: Secondary | ICD-10-CM | POA: Diagnosis not present

## 2021-07-24 DIAGNOSIS — E1122 Type 2 diabetes mellitus with diabetic chronic kidney disease: Secondary | ICD-10-CM | POA: Diagnosis not present

## 2021-07-24 DIAGNOSIS — N186 End stage renal disease: Secondary | ICD-10-CM | POA: Diagnosis not present

## 2021-07-24 DIAGNOSIS — Z992 Dependence on renal dialysis: Secondary | ICD-10-CM | POA: Diagnosis not present

## 2021-07-24 DIAGNOSIS — E1143 Type 2 diabetes mellitus with diabetic autonomic (poly)neuropathy: Secondary | ICD-10-CM | POA: Diagnosis not present

## 2021-07-26 DIAGNOSIS — Z992 Dependence on renal dialysis: Secondary | ICD-10-CM | POA: Diagnosis not present

## 2021-07-26 DIAGNOSIS — N2581 Secondary hyperparathyroidism of renal origin: Secondary | ICD-10-CM | POA: Diagnosis not present

## 2021-07-26 DIAGNOSIS — N186 End stage renal disease: Secondary | ICD-10-CM | POA: Diagnosis not present

## 2021-07-29 DIAGNOSIS — N2581 Secondary hyperparathyroidism of renal origin: Secondary | ICD-10-CM | POA: Diagnosis not present

## 2021-07-29 DIAGNOSIS — Z992 Dependence on renal dialysis: Secondary | ICD-10-CM | POA: Diagnosis not present

## 2021-07-29 DIAGNOSIS — G4730 Sleep apnea, unspecified: Secondary | ICD-10-CM | POA: Diagnosis not present

## 2021-07-29 DIAGNOSIS — N186 End stage renal disease: Secondary | ICD-10-CM | POA: Diagnosis not present

## 2021-07-30 DIAGNOSIS — R109 Unspecified abdominal pain: Secondary | ICD-10-CM | POA: Diagnosis not present

## 2021-07-30 DIAGNOSIS — N6320 Unspecified lump in the left breast, unspecified quadrant: Secondary | ICD-10-CM | POA: Diagnosis not present

## 2021-07-31 DIAGNOSIS — E119 Type 2 diabetes mellitus without complications: Secondary | ICD-10-CM | POA: Diagnosis not present

## 2021-07-31 DIAGNOSIS — N2581 Secondary hyperparathyroidism of renal origin: Secondary | ICD-10-CM | POA: Diagnosis not present

## 2021-07-31 DIAGNOSIS — N186 End stage renal disease: Secondary | ICD-10-CM | POA: Diagnosis not present

## 2021-07-31 DIAGNOSIS — Z992 Dependence on renal dialysis: Secondary | ICD-10-CM | POA: Diagnosis not present

## 2021-07-31 DIAGNOSIS — Z794 Long term (current) use of insulin: Secondary | ICD-10-CM | POA: Diagnosis not present

## 2021-08-02 DIAGNOSIS — N186 End stage renal disease: Secondary | ICD-10-CM | POA: Diagnosis not present

## 2021-08-02 DIAGNOSIS — N2581 Secondary hyperparathyroidism of renal origin: Secondary | ICD-10-CM | POA: Diagnosis not present

## 2021-08-02 DIAGNOSIS — Z992 Dependence on renal dialysis: Secondary | ICD-10-CM | POA: Diagnosis not present

## 2021-08-05 DIAGNOSIS — Z992 Dependence on renal dialysis: Secondary | ICD-10-CM | POA: Diagnosis not present

## 2021-08-05 DIAGNOSIS — N2581 Secondary hyperparathyroidism of renal origin: Secondary | ICD-10-CM | POA: Diagnosis not present

## 2021-08-05 DIAGNOSIS — N186 End stage renal disease: Secondary | ICD-10-CM | POA: Diagnosis not present

## 2021-08-06 ENCOUNTER — Other Ambulatory Visit (HOSPITAL_COMMUNITY): Payer: Self-pay | Admitting: Nurse Practitioner

## 2021-08-06 DIAGNOSIS — N6320 Unspecified lump in the left breast, unspecified quadrant: Secondary | ICD-10-CM

## 2021-08-07 DIAGNOSIS — Z992 Dependence on renal dialysis: Secondary | ICD-10-CM | POA: Diagnosis not present

## 2021-08-07 DIAGNOSIS — N186 End stage renal disease: Secondary | ICD-10-CM | POA: Diagnosis not present

## 2021-08-07 DIAGNOSIS — N2581 Secondary hyperparathyroidism of renal origin: Secondary | ICD-10-CM | POA: Diagnosis not present

## 2021-08-09 DIAGNOSIS — N2581 Secondary hyperparathyroidism of renal origin: Secondary | ICD-10-CM | POA: Diagnosis not present

## 2021-08-09 DIAGNOSIS — Z992 Dependence on renal dialysis: Secondary | ICD-10-CM | POA: Diagnosis not present

## 2021-08-09 DIAGNOSIS — N186 End stage renal disease: Secondary | ICD-10-CM | POA: Diagnosis not present

## 2021-08-10 DIAGNOSIS — N186 End stage renal disease: Secondary | ICD-10-CM | POA: Diagnosis not present

## 2021-08-10 DIAGNOSIS — Z992 Dependence on renal dialysis: Secondary | ICD-10-CM | POA: Diagnosis not present

## 2021-08-14 ENCOUNTER — Ambulatory Visit: Payer: Medicare HMO | Admitting: Podiatry

## 2021-08-14 NOTE — Progress Notes (Signed)
Appointment was canceled patient was not seen there is no visit

## 2021-08-15 ENCOUNTER — Ambulatory Visit (HOSPITAL_COMMUNITY)
Admission: RE | Admit: 2021-08-15 | Discharge: 2021-08-15 | Disposition: A | Discharge status: Home / Self Care | Payer: No Typology Code available for payment source | Source: Ambulatory Visit | Attending: Nurse Practitioner | Admitting: Nurse Practitioner

## 2021-08-15 ENCOUNTER — Other Ambulatory Visit: Payer: Self-pay

## 2021-08-15 ENCOUNTER — Other Ambulatory Visit (HOSPITAL_COMMUNITY): Payer: Self-pay | Admitting: Nurse Practitioner

## 2021-08-15 DIAGNOSIS — N6320 Unspecified lump in the left breast, unspecified quadrant: Secondary | ICD-10-CM

## 2021-08-15 DIAGNOSIS — N62 Hypertrophy of breast: Secondary | ICD-10-CM | POA: Insufficient documentation

## 2021-08-15 DIAGNOSIS — N6321 Unspecified lump in the left breast, upper outer quadrant: Secondary | ICD-10-CM | POA: Insufficient documentation

## 2021-08-15 DIAGNOSIS — R928 Other abnormal and inconclusive findings on diagnostic imaging of breast: Secondary | ICD-10-CM

## 2021-08-20 ENCOUNTER — Encounter (HOSPITAL_COMMUNITY): Payer: Self-pay

## 2021-08-20 ENCOUNTER — Other Ambulatory Visit: Payer: Self-pay

## 2021-08-20 ENCOUNTER — Ambulatory Visit (HOSPITAL_COMMUNITY)
Admission: RE | Admit: 2021-08-20 | Discharge: 2021-08-20 | Disposition: A | Discharge status: Home / Self Care | Payer: No Typology Code available for payment source | Source: Ambulatory Visit | Attending: Nurse Practitioner | Admitting: Nurse Practitioner

## 2021-08-20 DIAGNOSIS — R928 Other abnormal and inconclusive findings on diagnostic imaging of breast: Secondary | ICD-10-CM | POA: Diagnosis present

## 2021-08-20 MED ORDER — CHLOROPROCAINE HCL 2 % IJ SOLN
10.0000 mL | Freq: Once | INTRAMUSCULAR | Status: AC
  Administered 2021-08-20: 10 mL
  Filled 2021-08-20: qty 30

## 2021-08-21 ENCOUNTER — Encounter: Payer: Self-pay | Admitting: Podiatry

## 2021-08-21 ENCOUNTER — Ambulatory Visit (INDEPENDENT_AMBULATORY_CARE_PROVIDER_SITE_OTHER): Payer: No Typology Code available for payment source | Admitting: Podiatry

## 2021-08-21 ENCOUNTER — Ambulatory Visit (INDEPENDENT_AMBULATORY_CARE_PROVIDER_SITE_OTHER): Payer: No Typology Code available for payment source

## 2021-08-21 DIAGNOSIS — E08621 Diabetes mellitus due to underlying condition with foot ulcer: Secondary | ICD-10-CM

## 2021-08-21 DIAGNOSIS — L97522 Non-pressure chronic ulcer of other part of left foot with fat layer exposed: Secondary | ICD-10-CM | POA: Diagnosis not present

## 2021-08-21 DIAGNOSIS — E0843 Diabetes mellitus due to underlying condition with diabetic autonomic (poly)neuropathy: Secondary | ICD-10-CM

## 2021-08-21 LAB — SURGICAL PATHOLOGY

## 2021-08-21 NOTE — Progress Notes (Signed)
° °  Subjective:  51 y.o. male with PMHx of diabetes mellitus, ESRD on hemodialysis presenting for chronic ulcer of the left hallux.  Patient last seen in the office 06/24/2021.  He has been applying gentamicin cream to the toe.  He presents for further treatment evaluation   Past Medical History:  Diagnosis Date   Anemia    Anxiety    CHF (congestive heart failure) (HCC)    diastolic dysfunction   Chronic abdominal pain    COVID-19 07/2019   Depression    Esophagitis    ESRD on hemodialysis ([place])    undergoing evaluation for transplant   Eye hemorrhage    Gastritis    Gastroparesis    GERD (gastroesophageal reflux disease)    Glaucoma    Helicobacter pylori (H. pylori) infection 2016   ERADICATION DOCUMENTED VIA STOOL TEST in AUG 2016 at [facility]   Hiccups    Hypertension    Nausea and vomiting    chronic, recurrent   Neuropathy    feet   Renal insufficiency    Type 2 diabetes mellitus with complications ([place])    diagnosed around age 55   Vitreous hemorrhage of left eye ([place]) 12/15/2019       Objective/Physical Exam General: The patient is alert and oriented x3 in no acute distress.  Dermatology:  Wound #1 noted to the 1.0 x 1.0 x 0.2 cm (LxWxD).   To the noted ulceration(s), there is no eschar. There is a moderate amount of slough, fibrin, and necrotic tissue noted. Granulation tissue and wound base is red. There is a minimal amount of serosanguineous drainage noted. There is no exposed bone muscle-tendon ligament or joint. There is no malodor. Periwound integrity is intact. Skin is warm, dry and supple bilateral lower extremities.  Vascular: Palpable pedal pulses bilaterally. No edema or erythema noted. Capillary refill within normal limits.  Neurological: Epicritic and protective threshold diminished bilaterally.   Musculoskeletal Exam: Range of motion within normal limits to all pedal and ankle joints bilateral. Muscle strength 5/5 in all groups bilateral.    Assessment: 1.  Ulcer left hallux secondary to diabetes mellitus 2. diabetes mellitus w/ peripheral neuropathy   Plan of Care:  1. Patient was evaluated. 2. medically necessary excisional debridement including subcutaneous tissue was performed using a tissue nipper and a chisel blade. Excisional debridement of all the necrotic nonviable tissue down to healthy bleeding viable tissue was performed with post-debridement measurements same as pre-. 3. the wound was cleansed and dry sterile dressing applied. 4. Medihoney applied and provided to the patient to apply daily  5. Offloading felt pads applied to the insoles of the patient's new shoes  6.  Patient has not had updated ABIs ordered.  [facility]  7.  Patient is to return to clinic in 4 weeks   Edrick Kins, DPM [facility]  Dr. Edrick Kins, [address]                                        [place], [place] [address]                Office [phone]  Fax [phone]

## 2021-08-22 ENCOUNTER — Other Ambulatory Visit: Payer: Self-pay

## 2021-08-22 ENCOUNTER — Emergency Department (HOSPITAL_COMMUNITY)
Admission: EM | Admit: 2021-08-22 | Discharge: 2021-08-23 | Disposition: A | Discharge status: Home / Self Care | Payer: No Typology Code available for payment source | Attending: Emergency Medicine | Admitting: Emergency Medicine

## 2021-08-22 ENCOUNTER — Encounter (HOSPITAL_COMMUNITY): Payer: Self-pay

## 2021-08-22 DIAGNOSIS — K3184 Gastroparesis: Secondary | ICD-10-CM | POA: Insufficient documentation

## 2021-08-22 DIAGNOSIS — Z992 Dependence on renal dialysis: Secondary | ICD-10-CM | POA: Diagnosis not present

## 2021-08-22 DIAGNOSIS — R1013 Epigastric pain: Secondary | ICD-10-CM | POA: Diagnosis present

## 2021-08-22 LAB — COMPREHENSIVE METABOLIC PANEL
ALT: 15 U/L (ref 0–44)
AST: 20 U/L (ref 15–41)
Albumin: 4.1 g/dL (ref 3.5–5.0)
Alkaline Phosphatase: 60 U/L (ref 38–126)
Anion gap: 15 (ref 5–15)
BUN: 35 mg/dL — ABNORMAL HIGH (ref 6–20)
CO2: 22 mmol/L (ref 22–32)
Calcium: 9.7 mg/dL (ref 8.9–10.3)
Chloride: 97 mmol/L — ABNORMAL LOW (ref 98–111)
Creatinine, Ser: 6.51 mg/dL — ABNORMAL HIGH (ref 0.61–1.24)
GFR, Estimated: 10 mL/min — ABNORMAL LOW (ref >60)
Glucose, Bld: 99 mg/dL (ref 70–99)
Potassium: 3.2 mmol/L — ABNORMAL LOW (ref 3.5–5.1)
Sodium: 134 mmol/L — ABNORMAL LOW (ref 135–145)
Total Bilirubin: 0.8 mg/dL (ref 0.3–1.2)
Total Protein: 8.2 g/dL — ABNORMAL HIGH (ref 6.5–8.1)

## 2021-08-22 LAB — LIPASE, BLOOD: Lipase: 64 U/L — ABNORMAL HIGH (ref 11–51)

## 2021-08-22 LAB — CBC
HCT: 38.9 % — ABNORMAL LOW (ref 39.0–52.0)
Hemoglobin: 13.7 g/dL (ref 13.0–17.0)
MCH: 35.4 pg — ABNORMAL HIGH (ref 26.0–34.0)
MCHC: 35.2 g/dL (ref 30.0–36.0)
MCV: 100.5 fL — ABNORMAL HIGH (ref 80.0–100.0)
Platelets: 162 10*3/uL (ref 150–400)
RBC: 3.87 MIL/uL — ABNORMAL LOW (ref 4.22–5.81)
RDW: 12.8 % (ref 11.5–15.5)
WBC: 6.8 10*3/uL (ref 4.0–10.5)
nRBC: 0 % (ref 0.0–0.2)

## 2021-08-22 MED ORDER — HYDROMORPHONE HCL 1 MG/ML IJ SOLN
1.0000 mg | Freq: Once | INTRAMUSCULAR | Status: AC
  Administered 2021-08-22: 1 mg via INTRAVENOUS
  Filled 2021-08-22: qty 1

## 2021-08-22 MED ORDER — METOCLOPRAMIDE HCL 5 MG/ML IJ SOLN
10.0000 mg | Freq: Once | INTRAMUSCULAR | Status: AC
  Administered 2021-08-22: 10 mg via INTRAVENOUS
  Filled 2021-08-22: qty 2

## 2021-08-22 MED ORDER — FENTANYL CITRATE PF 50 MCG/ML IJ SOSY
50.0000 ug | PREFILLED_SYRINGE | Freq: Once | INTRAMUSCULAR | Status: AC
  Administered 2021-08-22: 50 ug via INTRAVENOUS
  Filled 2021-08-22: qty 1
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
## 2021-08-22 NOTE — Discharge Instructions (Signed)
Your testing is reassuring Please continue to take your home medicines as prescribed ER for worsening symtpoms.

## 2021-08-22 NOTE — ED Provider Notes (Signed)
[facility] Provider Note   CSN: [id number] Arrival date & time: 08/22/21  1812     History  Chief Complaint  Patient presents with   Gastroparesis    Timothy Clark is a 51 y.o. male.  HPI  This patient is a 51 year old male, he is presenting today with a complaint of nausea vomiting and epigastric pain.  The patient reports that this is exactly the same as his chronic gastroparesis.  He denies a history of pancreatitis or alcohol use, he states that he is already had medication for this prior to arrival today including metoclopramide.  The patient denies a history of other significant abdominal surgical problems.  His symptoms started today, he is a dialysis patient going Monday Wednesday and Friday.  He has not missed any dialysis and in fact he did go yesterday.  He does have nausea and vomiting with this but does not have any diarrhea and has not felt febrile nor has he felt short of breath or coughing.  Home Medications Prior to Admission medications   Medication Sig Start Date End Date Taking? Authorizing Provider  ALPRAZolam Duanne Moron) 0.5 MG tablet Take 0.5 mg by mouth 2 (two) times daily as needed for anxiety.    [provider]  amLODipine (NORVASC) 5 MG tablet Take 5 mg by mouth daily. Patient not taking: Reported on 06/22/2021    [provider]  atorvastatin (LIPITOR) 10 MG tablet Take 1 tablet (10 mg total) by mouth at bedtime. 10/13/20 06/22/21  Johnson, Clanford L, MD  brimonidine (ALPHAGAN) 0.15 % ophthalmic solution Place 1 drop into both eyes in the morning and at bedtime.     [provider]  dicyclomine (BENTYL) 20 MG tablet Take 1 tablet (20 mg total) by mouth 2 (two) times daily as needed for spasms. 07/23/21   Roemhildt, Lorin T, PA-C  divalproex (DEPAKOTE) 250 MG DR tablet Take 750 mg by mouth 3 (three) times daily.    [provider]  dorzolamide-timolol (COSOPT) 22.3-6.8 MG/ML ophthalmic solution Place 1  drop into both eyes 2 (two) times daily.  01/16/20   [provider]  famotidine (PEPCID) 20 MG tablet Take 1 tablet (20 mg total) by mouth daily. 05/28/21   Dwyane Dee, MD  folic acid (FOLVITE) 1 MG tablet Take 1 mg by mouth daily.    [provider]  gentamicin cream (GARAMYCIN) 0.1 % Apply 1 application topically 2 (two) times daily. Patient taking differently: Apply 1 application topically at bedtime. 12/12/20   Edrick Kins, DPM  Glucagon (GVOKE HYPOPEN 2-PACK) 1 MG/0.2ML SOAJ Inject 1 Syringe into the skin once as needed (hypoglycemia).    [provider]  Iron-FA-B Cmp-C-Biot-Probiotic (FUSION PLUS PO) Take 1 capsule by mouth daily.    [provider]  LEADER UNIFINE PENTIPS PLUS 32G X 4 MM MISC  05/17/20   [provider]  metoCLOPramide (REGLAN) 10 MG tablet Take 1 tablet (10 mg total) by mouth every 6 (six) hours. 05/12/21   Jacqlyn Larsen, PA-C  NOVOLOG MIX 70/30 FLEXPEN (70-30) 100 UNIT/ML FlexPen Inject 25-35 Units into the skin See admin instructions. Inject 35 units in the morning and 25 every evening - Sliding scale 02/06/21   [provider]  ondansetron (ZOFRAN-ODT) 4 MG disintegrating tablet Take 4 mg by mouth every 8 (eight) hours as needed for nausea or vomiting.    [provider]  pantoprazole (PROTONIX) 40 MG tablet Take 40 mg by mouth daily. 06/11/21  [provider]  polyethylene glycol (MIRALAX / GLYCOLAX) 17 g packet Take 17 g by mouth daily as needed for mild constipation. Patient taking differently: Take 17 g by mouth daily as needed for moderate constipation. 10/13/20   Johnson, Clanford L, MD  senna-docusate (SENOKOT-S) 8.6-50 MG tablet Take 1 tablet by mouth 2 (two) times daily. Patient not taking: No sig reported 10/13/20   Wynetta Emery, Clanford L, MD  sitaGLIPtin (JANUVIA) 25 MG tablet Take 25 mg by mouth daily.     [provider]  sucralfate (CARAFATE) 1 g tablet Take 1 g by mouth 4 (four)  times daily.    [provider]  torsemide (DEMADEX) 20 MG tablet Take 40 mg by mouth daily. At noon    [provider]  ziprasidone (GEODON) 20 MG capsule Take 1 tab with breakfast and 2 tabs at HS Patient taking differently: Take 20-40 mg by mouth See admin instructions. Take 20 mg capsule with breakfast and 40 mg at bedtime 10/13/20   Murlean Iba, MD      Allergies    Donnatal [phenobarbital-belladonna alk], Fish allergy, Haldol [haloperidol lactate], Lidocaine, Maalox [calcium carbonate antacid], Shellfish allergy, Aspirin, Doxycycline, Keflex [cephalexin], Marinol [dronabinol], Other, Penicillins, Tramadol, and Bactrim [sulfamethoxazole-trimethoprim]    Review of Systems   Review of Systems  All other systems reviewed and are negative.  Physical Exam Updated Vital Signs BP (!) 148/90    Pulse 89    Temp 98.3 F (36.8 C) (Oral)    Resp 18    Ht 1.829 m (6')    Wt 122.5 kg    SpO2 98%    BMI 36.63 kg/m  Physical Exam Vitals and nursing note reviewed.  Constitutional:      General: He is not in acute distress.    Appearance: He is well-developed.  HENT:     Head: Normocephalic and atraumatic.     Mouth/Throat:     Pharynx: No oropharyngeal exudate.  Eyes:     General: No scleral icterus.       Right eye: No discharge.        Left eye: No discharge.     Conjunctiva/sclera: Conjunctivae normal.     Pupils: Pupils are equal, round, and reactive to light.  Neck:     Thyroid: No thyromegaly.     Vascular: No JVD.  Cardiovascular:     Rate and Rhythm: Normal rate and regular rhythm.     Heart sounds: Normal heart sounds. No murmur heard.   No friction rub. No gallop.  Pulmonary:     Effort: Pulmonary effort is normal. No respiratory distress.     Breath sounds: Normal breath sounds. No wheezing or rales.  Abdominal:     General: Bowel sounds are normal. There is no distension.     Palpations: Abdomen is soft. There is no mass.     Tenderness: There is  abdominal tenderness.     Comments: Minimal epigastric tenderness, no guarding or peritoneal signs  Musculoskeletal:        General: No tenderness. Normal range of motion.     Cervical back: Normal range of motion and neck supple.     Right lower leg: No edema.     Left lower leg: No edema.  Lymphadenopathy:     Cervical: No cervical adenopathy.  Skin:    General: Skin is warm and dry.     Findings: No erythema or rash.  Neurological:     Mental Status: He  is alert.     Coordination: Coordination normal.     Comments: The patient is blind  Psychiatric:        Behavior: Behavior normal.    ED Results / Procedures / Treatments   Labs (all labs ordered are listed, but only abnormal results are displayed) Labs Reviewed  LIPASE, BLOOD - Abnormal; Notable for the following components:      Result Value   Lipase 64 (*)    All other components within normal limits  COMPREHENSIVE METABOLIC PANEL - Abnormal; Notable for the following components:   Sodium 134 (*)    Potassium 3.2 (*)    Chloride 97 (*)    BUN 35 (*)    Creatinine, Ser 6.51 (*)    Total Protein 8.2 (*)    GFR, Estimated 10 (*)    All other components within normal limits  CBC - Abnormal; Notable for the following components:   RBC 3.87 (*)    HCT 38.9 (*)    MCV 100.5 (*)    MCH 35.4 (*)    All other components within normal limits    EKG None  Radiology No results found.  Procedures Procedures    Medications Ordered in ED Medications  HYDROmorphone (DILAUDID) injection 1 mg (1 mg Intravenous Given 08/22/21 2028)  metoCLOPramide (REGLAN) injection 10 mg (10 mg Intravenous Given 08/22/21 2029)  fentaNYL (SUBLIMAZE) injection 50 mcg (50 mcg Intravenous Given 08/22/21 2316)    ED Course/ Medical Decision Making/ A&P                           Medical Decision Making  This patient presents to the ED for concern of nausea and vomiting, this is likely related to gastroparesis do I would keep an open mind  and think of things like pancreatitis or acute cholecystitis or bowel obstruction in the differential., this involves an extensive number of treatment options, and is a complaint that carries with it a high risk of complications and morbidity.     Co morbidities that complicate the patient evaluation  End-stage renal disease   Additional history obtained:  Additional history obtained from electronic medical record External records from outside source obtained and reviewed including multiple prior emergency department visits but also a prior admission of the hospital for a clotted AV graft, no recent admissions to the hospital   Lab Tests:  I Ordered, and personally interpreted labs.  The pertinent results include: Metabolic panel showing minimal hypokalemia at 3.2, creatinine of 6.5 which is understandable given that he is a dialysis patient, CBC without significant anemia or leukocytosis.  Lipase within normal limits    Cardiac Monitoring:  The patient was maintained on a cardiac monitor.  I personally viewed and interpreted the cardiac monitored which showed an underlying rhythm of: Normal sinus rhythm   Medicines ordered and prescription drug management:  I ordered medication including hydromorphone and metoclopramide for nausea and vomiting Reevaluation of the patient after these medicines showed that the patient improved I have reviewed the patients home medicines and have made adjustments as needed   Test Considered:  CT scan but patient has minimal tenderness and no significant distention to consider other causes   Critical Interventions:  Evaluation for other causes of patient's abdominal pain including pancreatitis, thankfully negative    Problem List / ED Course:  Gastroparesis as the likely cause, the patient's laboratory work-up is reassuring, he improved significantly with medications and at  this time is stable for discharge, he wants to go to dialysis in the  morning   Reevaluation:  After the interventions noted above, I reevaluated the patient and found that they have :improved   Social Determinants of Health:  Blind, dialysis patient   Dispostion:  After consideration of the diagnostic results and the patients response to treatment, I feel that the patent would benefit from discharge home.          Final Clinical Impression(s) / ED Diagnoses Final diagnoses:  Gastroparesis     Noemi Chapel, MD 08/22/21 2329

## 2021-08-22 NOTE — ED Triage Notes (Signed)
Pov with father- who has went home- will come back for pt.  Complains of abdominal pain with emesis x5 from his gastroparesis. Took bentyl and Reglan around 6pm. Says he is able to keep liquids down sometimes.  MWF dialysis. He did go Wednesday. He is left side restricted from AV fistula.  Pt is almost completely blind.

## 2021-08-24 ENCOUNTER — Encounter (HOSPITAL_COMMUNITY): Payer: Self-pay | Admitting: *Deleted

## 2021-08-24 ENCOUNTER — Emergency Department (HOSPITAL_COMMUNITY)
Admission: EM | Admit: 2021-08-24 | Discharge: 2021-08-24 | Disposition: A | Discharge status: Home / Self Care | Payer: No Typology Code available for payment source | Attending: Emergency Medicine | Admitting: Emergency Medicine

## 2021-08-24 DIAGNOSIS — G8929 Other chronic pain: Secondary | ICD-10-CM

## 2021-08-24 DIAGNOSIS — K047 Periapical abscess without sinus: Secondary | ICD-10-CM | POA: Diagnosis not present

## 2021-08-24 DIAGNOSIS — R109 Unspecified abdominal pain: Secondary | ICD-10-CM

## 2021-08-24 DIAGNOSIS — Z79899 Other long term (current) drug therapy: Secondary | ICD-10-CM | POA: Insufficient documentation

## 2021-08-24 DIAGNOSIS — R1013 Epigastric pain: Secondary | ICD-10-CM | POA: Insufficient documentation

## 2021-08-24 MED ORDER — CLINDAMYCIN HCL 300 MG PO CAPS: 300.0000 mg | ORAL_CAPSULE | Freq: Three times a day (TID) | ORAL | 0 refills | Status: AC

## 2021-08-24 MED ORDER — CHLORHEXIDINE GLUCONATE 0.12 % MT SOLN: 15.0000 mL | Freq: Two times a day (BID) | OROMUCOSAL | 0 refills | Status: AC

## 2021-08-24 MED ORDER — SUCRALFATE 1 GM/10ML PO SUSP
1.0000 g | Freq: Once | ORAL | Status: AC
  Administered 2021-08-24: 1 g via ORAL
  Filled 2021-08-24: qty 10

## 2021-08-24 MED ORDER — ALPRAZOLAM 0.5 MG PO TABS
0.5000 mg | ORAL_TABLET | Freq: Once | ORAL | Status: AC
  Administered 2021-08-24: 0.5 mg via ORAL
  Filled 2021-08-24: qty 1

## 2021-08-24 MED ORDER — CLINDAMYCIN HCL 150 MG PO CAPS
300.0000 mg | ORAL_CAPSULE | Freq: Once | ORAL | Status: AC
  Administered 2021-08-24: 300 mg via ORAL
  Filled 2021-08-24: qty 2

## 2021-08-24 NOTE — ED Notes (Signed)
Abcess noted to top left gum. No drainage noted at this time. Several broken / black teeth in his mouth.

## 2021-08-24 NOTE — Discharge Instructions (Addendum)
You have been prescribed clindamycin for your dental/mouth infection.  You are also given a mouth cleansing solution to help.  Follow-up with a dentist to see if any further treatment is needed.  Return to the ER if you develop fever, inability to swallow, or any other new/concerning symptoms.

## 2021-08-24 NOTE — ED Notes (Signed)
Pt requesting to talk with dr.

## 2021-08-24 NOTE — ED Triage Notes (Signed)
States he has an abscess in his mouth, states the drainage is causing abdominal pain, also feels like his anxiety is at a high level

## 2021-08-24 NOTE — ED Provider Notes (Signed)
[facility] Provider Note   CSN: [id number] Arrival date & time: 08/24/21  1116     History  Chief Complaint  Patient presents with   Abdominal Pain   Anxiety    Timothy Clark is a 51 y.o. male.  HPI 51 year old male presents with dental infection and subsequent flare of his chronic gastroparesis.  He tells me that he has had a few days of drainage in his mouth and left maxillary dental pain.  No facial swelling.  No fevers.  This drainage has caused his gastroparesis to flare.  This is what was going on when he was seen in the emergency department on 1/12.  He states he was too "out of it" to tell the physician at that time.  He has tried some leftover Cipro he had.  He states he vomited twice this morning.  He went to dialysis yesterday and had a normal session.  Home Medications Prior to Admission medications   Medication Sig Start Date End Date Taking? Authorizing Provider  chlorhexidine (PERIDEX) 0.12 % solution Use as directed 15 mLs in the mouth or throat 2 (two) times daily. 08/24/21  Yes Sherwood Gambler, MD  clindamycin (CLEOCIN) 300 MG capsule Take 1 capsule (300 mg total) by mouth 3 (three) times daily for 7 days. 08/24/21 08/31/21 Yes Sherwood Gambler, MD  ALPRAZolam Duanne Moron) 0.5 MG tablet Take 0.5 mg by mouth 2 (two) times daily as needed for anxiety.    [provider]  amLODipine (NORVASC) 5 MG tablet Take 5 mg by mouth daily. Patient not taking: Reported on 06/22/2021    [provider]  atorvastatin (LIPITOR) 10 MG tablet Take 1 tablet (10 mg total) by mouth at bedtime. 10/13/20 06/22/21  Johnson, Clanford L, MD  brimonidine (ALPHAGAN) 0.15 % ophthalmic solution Place 1 drop into both eyes in the morning and at bedtime.     [provider]  dicyclomine (BENTYL) 20 MG tablet Take 1 tablet (20 mg total) by mouth 2 (two) times daily as needed for spasms. 07/23/21   Roemhildt, Lorin T, PA-C  divalproex (DEPAKOTE) 250 MG DR  tablet Take 750 mg by mouth 3 (three) times daily.    [provider]  dorzolamide-timolol (COSOPT) 22.3-6.8 MG/ML ophthalmic solution Place 1 drop into both eyes 2 (two) times daily.  01/16/20   [provider]  famotidine (PEPCID) 20 MG tablet Take 1 tablet (20 mg total) by mouth daily. 05/28/21   Dwyane Dee, MD  folic acid (FOLVITE) 1 MG tablet Take 1 mg by mouth daily.    [provider]  gentamicin cream (GARAMYCIN) 0.1 % Apply 1 application topically 2 (two) times daily. Patient taking differently: Apply 1 application topically at bedtime. 12/12/20   Edrick Kins, DPM  Glucagon (GVOKE HYPOPEN 2-PACK) 1 MG/0.2ML SOAJ Inject 1 Syringe into the skin once as needed (hypoglycemia).    [provider]  Iron-FA-B Cmp-C-Biot-Probiotic (FUSION PLUS PO) Take 1 capsule by mouth daily.    [provider]  LEADER UNIFINE PENTIPS PLUS 32G X 4 MM MISC  05/17/20   [provider]  metoCLOPramide (REGLAN) 10 MG tablet Take 1 tablet (10 mg total) by mouth every 6 (six) hours. 05/12/21   Jacqlyn Larsen, PA-C  NOVOLOG MIX 70/30 FLEXPEN (70-30) 100 UNIT/ML FlexPen Inject 25-35 Units into the skin See admin instructions. Inject 35 units in the morning and 25 every evening - Sliding scale 02/06/21   [provider]  ondansetron (ZOFRAN-ODT) 4  MG disintegrating tablet Take 4 mg by mouth every 8 (eight) hours as needed for nausea or vomiting.    [provider]  pantoprazole (PROTONIX) 40 MG tablet Take 40 mg by mouth daily. 06/11/21   [provider]  polyethylene glycol (MIRALAX / GLYCOLAX) 17 g packet Take 17 g by mouth daily as needed for mild constipation. Patient taking differently: Take 17 g by mouth daily as needed for moderate constipation. 10/13/20   Johnson, Clanford L, MD  senna-docusate (SENOKOT-S) 8.6-50 MG tablet Take 1 tablet by mouth 2 (two) times daily. Patient not taking: No sig reported 10/13/20   Wynetta Emery, Clanford L, MD   sitaGLIPtin (JANUVIA) 25 MG tablet Take 25 mg by mouth daily.     [provider]  sucralfate (CARAFATE) 1 g tablet Take 1 g by mouth 4 (four) times daily.    [provider]  torsemide (DEMADEX) 20 MG tablet Take 40 mg by mouth daily. At noon    [provider]  ziprasidone (GEODON) 20 MG capsule Take 1 tab with breakfast and 2 tabs at HS Patient taking differently: Take 20-40 mg by mouth See admin instructions. Take 20 mg capsule with breakfast and 40 mg at bedtime 10/13/20   Murlean Iba, MD      Allergies    Donnatal [phenobarbital-belladonna alk], Fish allergy, Haldol [haloperidol lactate], Lidocaine, Maalox [calcium carbonate antacid], Shellfish allergy, Aspirin, Doxycycline, Keflex [cephalexin], Marinol [dronabinol], Other, Penicillins, Tramadol, and Bactrim [sulfamethoxazole-trimethoprim]    Review of Systems   Review of Systems  Constitutional:  Negative for fever.  HENT:  Positive for dental problem. Negative for facial swelling.   Respiratory:  Negative for shortness of breath.   Cardiovascular:  Negative for chest pain.  Gastrointestinal:  Positive for abdominal pain and vomiting.   Physical Exam Updated Vital Signs BP (!) 95/57    Pulse 84    Temp 98 F (36.7 C)    Resp (!) 22    SpO2 100%  Physical Exam Vitals and nursing note reviewed.  Constitutional:      General: He is not in acute distress.    Appearance: He is well-developed. He is obese. He is not ill-appearing or diaphoretic.  HENT:     Head: Normocephalic and atraumatic.     Jaw: No trismus, swelling, pain on movement or malocclusion.     Comments: No facial swelling.    Mouth/Throat:     Comments: Diffusely poor dentition.  Multiple diffusely broken teeth with only stumps remaining.  Do not see any obvious dental abscess or purulent drainage. Cardiovascular:     Rate and Rhythm: Normal rate and regular rhythm.     Heart sounds: Normal heart sounds.  Pulmonary:     Effort:  Pulmonary effort is normal.     Breath sounds: Normal breath sounds.  Abdominal:     Palpations: Abdomen is soft.     Tenderness: There is abdominal tenderness in the epigastric area.  Skin:    General: Skin is warm and dry.  Neurological:     Mental Status: He is alert.    ED Results / Procedures / Treatments   Labs (all labs ordered are listed, but only abnormal results are displayed) Labs Reviewed - No data to display  EKG None  Radiology DG Foot Complete Left  Result Date: 08/23/2021 Please see detailed radiograph report in office note.   Procedures Procedures    Medications Ordered in ED Medications  clindamycin (CLEOCIN) capsule 300 mg (300  mg Oral Given 08/24/21 1222)    ED Course/ Medical Decision Making/ A&P                           Medical Decision Making  Overall, patient appears to have a potential dental/gingival infection without an obvious abscess on exam.  He also has acute on chronic abdominal pain/gastroparesis.  From a dental perspective, there is no drainable abscess and after discussion with patient we will try some clindamycin and stop the Cipro.  From an abdominal pain perspective, this pain he is describing is very similar to many prior flares of his chronic abdominal pain.  He is requesting narcotics.  I do not think this is warranted and have tried to have a discussion with he and family at the bedside.  However, patient has frequently received narcotics whenever he complains of abdominal pain and so this is what he is expecting.  However he is telling me this as he is sitting very comfortably on the bed and has not shown any vomiting.  My suspicion of an acute intra-abdominal emergency is pretty low.  He had labs 2 days ago when he was here for similar symptoms and they were near his baseline.  He just received dialysis yesterday and so my suspicion of an acute electrolyte disturbance is pretty low.  After long and multiple discussions with  patient, we will give him his chronic Xanax as he is stating he now has an anxiety attack and will give him Carafate.  He has declined Bentyl and Reglan which he states he already took this morning.  I do not feel like labs or CTs are indicated at this time.  Will discharge with return precautions.        Final Clinical Impression(s) / ED Diagnoses Final diagnoses:  Dental infection  Chronic abdominal pain    Rx / DC Orders ED Discharge Orders          Ordered    clindamycin (CLEOCIN) 300 MG capsule  3 times daily        08/24/21 1213    chlorhexidine (PERIDEX) 0.12 % solution  2 times daily        08/24/21 1250              Sherwood Gambler, MD 08/24/21 1439

## 2021-08-29 ENCOUNTER — Encounter (HOSPITAL_COMMUNITY): Payer: No Typology Code available for payment source

## 2021-09-07 ENCOUNTER — Encounter (HOSPITAL_COMMUNITY): Payer: Self-pay | Admitting: *Deleted

## 2021-09-07 ENCOUNTER — Emergency Department (HOSPITAL_COMMUNITY)
Admission: EM | Admit: 2021-09-07 | Discharge: 2021-09-07 | Disposition: A | Discharge status: Home / Self Care | Payer: No Typology Code available for payment source | Attending: Emergency Medicine | Admitting: Emergency Medicine

## 2021-09-07 ENCOUNTER — Other Ambulatory Visit: Payer: Self-pay

## 2021-09-07 DIAGNOSIS — Z79899 Other long term (current) drug therapy: Secondary | ICD-10-CM | POA: Diagnosis not present

## 2021-09-07 DIAGNOSIS — K3184 Gastroparesis: Secondary | ICD-10-CM

## 2021-09-07 DIAGNOSIS — Z992 Dependence on renal dialysis: Secondary | ICD-10-CM | POA: Diagnosis not present

## 2021-09-07 DIAGNOSIS — E1143 Type 2 diabetes mellitus with diabetic autonomic (poly)neuropathy: Secondary | ICD-10-CM | POA: Diagnosis not present

## 2021-09-07 DIAGNOSIS — N186 End stage renal disease: Secondary | ICD-10-CM | POA: Diagnosis not present

## 2021-09-07 DIAGNOSIS — R112 Nausea with vomiting, unspecified: Secondary | ICD-10-CM | POA: Diagnosis not present

## 2021-09-07 DIAGNOSIS — Z7984 Long term (current) use of oral hypoglycemic drugs: Secondary | ICD-10-CM | POA: Insufficient documentation

## 2021-09-07 DIAGNOSIS — D649 Anemia, unspecified: Secondary | ICD-10-CM | POA: Diagnosis not present

## 2021-09-07 DIAGNOSIS — Z794 Long term (current) use of insulin: Secondary | ICD-10-CM | POA: Insufficient documentation

## 2021-09-07 DIAGNOSIS — I120 Hypertensive chronic kidney disease with stage 5 chronic kidney disease or end stage renal disease: Secondary | ICD-10-CM | POA: Diagnosis not present

## 2021-09-07 DIAGNOSIS — R944 Abnormal results of kidney function studies: Secondary | ICD-10-CM | POA: Insufficient documentation

## 2021-09-07 DIAGNOSIS — E876 Hypokalemia: Secondary | ICD-10-CM | POA: Diagnosis not present

## 2021-09-07 DIAGNOSIS — R1013 Epigastric pain: Secondary | ICD-10-CM | POA: Diagnosis present

## 2021-09-07 LAB — COMPREHENSIVE METABOLIC PANEL
ALT: 16 U/L (ref 0–44)
AST: 19 U/L (ref 15–41)
Albumin: 3.5 g/dL (ref 3.5–5.0)
Alkaline Phosphatase: 41 U/L (ref 38–126)
Anion gap: 11 (ref 5–15)
BUN: 22 mg/dL — ABNORMAL HIGH (ref 6–20)
CO2: 25 mmol/L (ref 22–32)
Calcium: 8.6 mg/dL — ABNORMAL LOW (ref 8.9–10.3)
Chloride: 97 mmol/L — ABNORMAL LOW (ref 98–111)
Creatinine, Ser: 6.03 mg/dL — ABNORMAL HIGH (ref 0.61–1.24)
GFR, Estimated: 11 mL/min — ABNORMAL LOW (ref >60)
Glucose, Bld: 102 mg/dL — ABNORMAL HIGH (ref 70–99)
Potassium: 2.7 mmol/L — CL (ref 3.5–5.1)
Sodium: 133 mmol/L — ABNORMAL LOW (ref 135–145)
Total Bilirubin: 1.1 mg/dL (ref 0.3–1.2)
Total Protein: 6.5 g/dL (ref 6.5–8.1)

## 2021-09-07 LAB — CBC WITH DIFFERENTIAL/PLATELET
Abs Immature Granulocytes: 0.02 10*3/uL (ref 0.00–0.07)
Basophils Absolute: 0 10*3/uL (ref 0.0–0.1)
Basophils Relative: 0 %
Eosinophils Absolute: 0 10*3/uL (ref 0.0–0.5)
Eosinophils Relative: 0 %
HCT: 28.4 % — ABNORMAL LOW (ref 39.0–52.0)
Hemoglobin: 9.9 g/dL — ABNORMAL LOW (ref 13.0–17.0)
Immature Granulocytes: 0 %
Lymphocytes Relative: 15 %
Lymphs Abs: 1 10*3/uL (ref 0.7–4.0)
MCH: 36.3 pg — ABNORMAL HIGH (ref 26.0–34.0)
MCHC: 34.9 g/dL (ref 30.0–36.0)
MCV: 104 fL — ABNORMAL HIGH (ref 80.0–100.0)
Monocytes Absolute: 0.6 10*3/uL (ref 0.1–1.0)
Monocytes Relative: 9 %
Neutro Abs: 5.2 10*3/uL (ref 1.7–7.7)
Neutrophils Relative %: 76 %
Platelets: 126 10*3/uL — ABNORMAL LOW (ref 150–400)
RBC: 2.73 MIL/uL — ABNORMAL LOW (ref 4.22–5.81)
RDW: 13.2 % (ref 11.5–15.5)
WBC: 7 10*3/uL (ref 4.0–10.5)
nRBC: 0 % (ref 0.0–0.2)

## 2021-09-07 LAB — ETHANOL: Alcohol, Ethyl (B): <10 mg/dL (ref <10)

## 2021-09-07 LAB — POC OCCULT BLOOD, ED: Fecal Occult Bld: NEGATIVE

## 2021-09-07 LAB — LIPASE, BLOOD: Lipase: 27 U/L (ref 11–51)

## 2021-09-07 MED ORDER — POTASSIUM CHLORIDE ER 10 MEQ PO TBCR: 10.0000 meq | EXTENDED_RELEASE_TABLET | Freq: Every day | ORAL | 0 refills | Status: AC

## 2021-09-07 MED ORDER — METOCLOPRAMIDE HCL 5 MG/ML IJ SOLN
10.0000 mg | Freq: Once | INTRAMUSCULAR | Status: AC
  Administered 2021-09-07: 10 mg via INTRAVENOUS
  Filled 2021-09-07: qty 2

## 2021-09-07 MED ORDER — FENTANYL CITRATE PF 50 MCG/ML IJ SOSY
50.0000 ug | PREFILLED_SYRINGE | Freq: Once | INTRAMUSCULAR | Status: AC
  Administered 2021-09-07: 50 ug via INTRAVENOUS
  Filled 2021-09-07: qty 1

## 2021-09-07 MED ORDER — POTASSIUM CHLORIDE 10 MEQ/100ML IV SOLN
10.0000 meq | INTRAVENOUS | Status: AC
  Administered 2021-09-07 (×2): 10 meq via INTRAVENOUS
  Filled 2021-09-07 (×2): qty 100

## 2021-09-07 MED ORDER — SODIUM CHLORIDE 0.9 % IV BOLUS
500.0000 mL | Freq: Once | INTRAVENOUS | Status: AC
  Administered 2021-09-07: 500 mL via INTRAVENOUS

## 2021-09-07 MED ORDER — SODIUM CHLORIDE 0.9 % IV BOLUS: 1000.0000 mL | Freq: Once | INTRAVENOUS | Status: DC

## 2021-09-07 MED ORDER — HYDROMORPHONE HCL 1 MG/ML IJ SOLN
1.0000 mg | Freq: Once | INTRAMUSCULAR | Status: AC
  Administered 2021-09-07: 1 mg via INTRAVENOUS
  Filled 2021-09-07: qty 1

## 2021-09-07 NOTE — Discharge Instructions (Addendum)
Please pick up potassium supplements and take as prescribed. You will need to have your potassium level rechecked in 1-2 weeks. This can be done by your PCP.   Continue taking your Reglan and Bentyl for your gastroparesis. Please follow up with your gastroenterologist for further evaluation/to discuss other options for symptom control.   You can also follow up with the [facility] as needed for further evaluation of concern for behavioral changes per your family member today.   Return to the ED for any new/worsening symptoms

## 2021-09-07 NOTE — ED Triage Notes (Signed)
Pt c/o abdominal pain and n/v since this morning.

## 2021-09-07 NOTE — ED Notes (Signed)
Brother , Lanny Hurst contacted for transportation.

## 2021-09-07 NOTE — ED Notes (Signed)
Date and time results received: 09/07/21 1230  Test: Potassium Critical Value: 2.7  Name of Provider Notified: Eustaquio Maize, PA  Orders Received? Or Actions Taken?: See chart

## 2021-09-07 NOTE — ED Notes (Signed)
Pt very difficult IV stick. Multiple ultrasound IV attempts. PA notified.

## 2021-09-07 NOTE — ED Provider Notes (Signed)
[facility] Provider Note   CSN: [id number] Arrival date & time: 09/07/21  1030     History  Chief Complaint  Patient presents with   Abdominal Pain    MITCH ARQUETTE is a 51 y.o. male with PMHx HTN, Diabetes, Gastroparesis, GERD, ESRD on Dialysis MWF who presents to the ED today with complaint of gradual onset, constant, sharp, epigastric abdominal pain that began this morning. Pt also complains of nausea and NBNB emesis. Pt reports this feels similar to previous gastroparesis. He states he takes Bentyl for same; last took a dose this morning without relief.   Pt's family member is at bedside. Reports that he has been to multiple ERs "every other day" for the past 2 weeks for same. Per chart review pt with ED on 1/12 to this ED. Additional ED visit on 1/22 and 1/24 at [facility] for same. Workup overall reassuring and CT scan done on 1/24 without acute findings. Family member pulls me aside after examination and informs me that pt has been sneaking out of his house and laying in the middle of the highway at times. He states that pt attempted to jump out of his car today and run away prior to coming to the ED however he was able to convince him to come. He states that at dialysis yesterday pt's sister was given information for a behavioral health clinic for further evaluation due to pt's behavior. Per chart review it appears that pt was combative during last ED visit on 1/24 in [place] which they reported was consistent with previous behavior s/2 abdominal pain. He required sedation with Haldol, Ativan, and Benadryl as well as Geodon as he would not cooperative for labwork or CT scan. Workup was completed while pt was sedated and once he awoke he returned to his baseline mental status. It does appear that family was quite upset regarding pt's behavior problems and began getting verbally aggressive with staff. Ultimately pt decided to leave AMA.   The history is provided  by the patient, medical records and a relative.      Home Medications Prior to Admission medications   Medication Sig Start Date End Date Taking? Authorizing Provider  ALPRAZolam Duanne Moron) 0.5 MG tablet Take 0.5 mg by mouth 2 (two) times daily as needed for anxiety.   Yes [provider]  amLODipine (NORVASC) 5 MG tablet Take 5 mg by mouth daily.   Yes [provider]  atorvastatin (LIPITOR) 10 MG tablet Take 1 tablet (10 mg total) by mouth at bedtime. 10/13/20 09/07/21 Yes Johnson, Clanford L, MD  brimonidine (ALPHAGAN) 0.15 % ophthalmic solution Place 1 drop into both eyes in the morning and at bedtime.    Yes [provider]  chlorhexidine (PERIDEX) 0.12 % solution Use as directed 15 mLs in the mouth or throat 2 (two) times daily. 08/24/21  Yes Sherwood Gambler, MD  dicyclomine (BENTYL) 20 MG tablet Take 1 tablet (20 mg total) by mouth 2 (two) times daily as needed for spasms. 07/23/21  Yes Roemhildt, Lorin T, PA-C  divalproex (DEPAKOTE) 250 MG DR tablet Take 750 mg by mouth 3 (three) times daily.   Yes [provider]  dorzolamide-timolol (COSOPT) 22.3-6.8 MG/ML ophthalmic solution Place 1 drop into both eyes 2 (two) times daily.  01/16/20  Yes [provider]  famotidine (PEPCID) 20 MG tablet Take 1 tablet (20 mg total) by mouth daily. 05/28/21  Yes Dwyane Dee, MD  folic acid (FOLVITE) 1 MG tablet Take 1  mg by mouth daily.   Yes [provider]  potassium chloride (KLOR-CON) 10 MEQ tablet Take 1 tablet (10 mEq total) by mouth daily for 5 days. 09/07/21 09/12/21 Yes Devoiry Corriher, PA-C  gentamicin cream (GARAMYCIN) 0.1 % Apply 1 application topically 2 (two) times daily. Patient not taking: Reported on 09/07/2021 12/12/20   Edrick Kins, DPM  Glucagon (GVOKE HYPOPEN 2-PACK) 1 MG/0.2ML SOAJ Inject 1 Syringe into the skin once as needed (hypoglycemia).    [provider]  Iron-FA-B Cmp-C-Biot-Probiotic (FUSION PLUS PO) Take 1 capsule by  mouth daily.    [provider]  LEADER UNIFINE PENTIPS PLUS 32G X 4 MM MISC  05/17/20   [provider]  metoCLOPramide (REGLAN) 10 MG tablet Take 1 tablet (10 mg total) by mouth every 6 (six) hours. 05/12/21   Jacqlyn Larsen, PA-C  NOVOLOG MIX 70/30 FLEXPEN (70-30) 100 UNIT/ML FlexPen Inject 25-35 Units into the skin See admin instructions. Inject 35 units in the morning and 25 every evening - Sliding scale 02/06/21   [provider]  ondansetron (ZOFRAN-ODT) 4 MG disintegrating tablet Take 4 mg by mouth every 8 (eight) hours as needed for nausea or vomiting.    [provider]  pantoprazole (PROTONIX) 40 MG tablet Take 40 mg by mouth daily. 06/11/21   [provider]  polyethylene glycol (MIRALAX / GLYCOLAX) 17 g packet Take 17 g by mouth daily as needed for mild constipation. Patient taking differently: Take 17 g by mouth daily as needed for moderate constipation. 10/13/20   Johnson, Clanford L, MD  senna-docusate (SENOKOT-S) 8.6-50 MG tablet Take 1 tablet by mouth 2 (two) times daily. Patient not taking: No sig reported 10/13/20   Wynetta Emery, Clanford L, MD  sitaGLIPtin (JANUVIA) 25 MG tablet Take 25 mg by mouth daily.     [provider]  sucralfate (CARAFATE) 1 g tablet Take 1 g by mouth 4 (four) times daily.    [provider]  torsemide (DEMADEX) 20 MG tablet Take 40 mg by mouth daily. At noon    [provider]  ziprasidone (GEODON) 20 MG capsule Take 1 tab with breakfast and 2 tabs at HS Patient taking differently: Take 20-40 mg by mouth See admin instructions. Take 20 mg capsule with breakfast and 40 mg at bedtime 10/13/20   Murlean Iba, MD      Allergies    Donnatal [phenobarbital-belladonna alk], Fish allergy, Haldol [haloperidol lactate], Lidocaine, Maalox [calcium carbonate antacid], Shellfish allergy, Aspirin, Doxycycline, Keflex [cephalexin], Marinol [dronabinol], Other, Penicillins, Tramadol, and Bactrim  [sulfamethoxazole-trimethoprim]    Review of Systems   Review of Systems  Constitutional:  Negative for chills and fever.  Gastrointestinal:  Positive for abdominal pain, nausea and vomiting. Negative for constipation and diarrhea.  All other systems reviewed and are negative.  Physical Exam Updated Vital Signs BP 133/63 (BP Location: Right Arm)    Pulse (!) 105    Temp 98.5 F (36.9 C) (Oral)    Resp 15    Ht 6' (1.829 m)    Wt 122.5 kg    SpO2 100%    BMI 36.63 kg/m  Physical Exam Vitals and nursing note reviewed.  Constitutional:      Appearance: He is obese. He is not ill-appearing or diaphoretic.     Comments: Laying in bed with eyes closed and clutching epigastric area  HENT:     Head: Normocephalic and atraumatic.  Eyes:     Conjunctiva/sclera: Conjunctivae normal.  Cardiovascular:  Rate and Rhythm: Normal rate and regular rhythm.  Pulmonary:     Effort: Pulmonary effort is normal.     Breath sounds: Normal breath sounds. No wheezing, rhonchi or rales.  Abdominal:     General: Abdomen is flat.     Palpations: Abdomen is soft.     Tenderness: There is abdominal tenderness in the epigastric area. There is no guarding or rebound.  Musculoskeletal:     Cervical back: Neck supple.  Skin:    General: Skin is warm and dry.  Neurological:     Mental Status: He is alert.    ED Results / Procedures / Treatments   Labs (all labs ordered are listed, but only abnormal results are displayed) Labs Reviewed  COMPREHENSIVE METABOLIC PANEL - Abnormal; Notable for the following components:      Result Value   Sodium 133 (*)    Potassium 2.7 (*)    Chloride 97 (*)    Glucose, Bld 102 (*)    BUN 22 (*)    Creatinine, Ser 6.03 (*)    Calcium 8.6 (*)    GFR, Estimated 11 (*)    All other components within normal limits  CBC WITH DIFFERENTIAL/PLATELET - Abnormal; Notable for the following components:   RBC 2.73 (*)    Hemoglobin 9.9 (*)    HCT 28.4 (*)    MCV 104.0 (*)     MCH 36.3 (*)    Platelets 126 (*)    All other components within normal limits  LIPASE, BLOOD  ETHANOL  POC OCCULT BLOOD, ED    EKG EKG Interpretation  Date/Time:  Saturday September 07 2021 13:47:13 EST Ventricular Rate:  99 PR Interval:  160 QRS Duration: 77 QT Interval:  372 QTC Calculation: 478 R Axis:   2 Text Interpretation: Sinus tachycardia Multiple premature complexes, vent & supraven Low voltage, precordial leads Anteroseptal infarct, old Since last tracing rate faster Confirmed by Dorie Rank [phone]) on 09/07/2021 2:14:05 PM  Radiology No results found.  Procedures Procedures    Medications Ordered in ED Medications  potassium chloride 10 mEq in 100 mL IVPB (10 mEq Intravenous New Bag/Given 09/07/21 1555)  HYDROmorphone (DILAUDID) injection 1 mg (1 mg Intravenous Given 09/07/21 1238)  metoCLOPramide (REGLAN) injection 10 mg (10 mg Intravenous Given 09/07/21 1224)  sodium chloride 0.9 % bolus 500 mL (500 mLs Intravenous New Bag/Given 09/07/21 1323)  fentaNYL (SUBLIMAZE) injection 50 mcg (50 mcg Intravenous Given 09/07/21 1423)    ED Course/ Medical Decision Making/ A&P Clinical Course as of 09/07/21 1628  Sat Sep 07, 2021  1326 Potassium(!!): 2.7 [MV]  1328 Hemoglobin(!): 9.9 11.4 on 1/24 per chart review [MV]    Clinical Course User Index [MV] Eustaquio Maize, PA-C                           Medical Decision Making 51 year old male who presents to the ED today with complaint of epigastric abdominal pain, nausea, vomiting that began this morning with history of gastroparesis.  Easily seen at [facility] 4 days ago with reassuring work-up and negative CT scan.  Arrival to the ED today patient is afebrile, nontachycardic and nontachypneic.  He does appear uncomfortable on exam however is not ill-appearing.  He is laying in bed with his eyes closed and clutching his epigastric area.  He is noted to be mildly tender to palpation in this area.  No lower abdominal tenderness  palpation no rebound or guarding.  Family member does pull me aside to provide additional information.  He is concerned regarding patient's behavioral outburst.  It appears that he was recently combative during last ED visit on 1/24 and required multiple medications for sedation.  Family member endorses that patient has been attempting to lay out in the highway however continues to deny SI, HI, AVH and therefore is alluding psychiatric evaluation.  We will plan to monitor patient closely.  Per my chart review does not appear that he has been combative in our hospital system during previous episodes for gastroparesis which is likely the cause of patient's symptoms at this time.  Plan for lab work including CBC, CMP, lipase.  Will provide small amount of fluids, antiemetics, pain medication with plans for reevaluation.  Patient is feeling better will discuss family concerns with him and potentially get behavioral health/TTS involved.   Once patient more comfortable I was able to have lengthy discussion with him. He denies SI, HI, or AVH. He reports he tried to jump out of the car earlier this week due to the amount of pain he was in. He states the car was stopped and he was not in any real danger. He also vehemently denies laying in the highway. Pt does not appear to be a threat to himself or others at this time. He has been cooperative the entire time in the ED today without combativeness. Pt provided with [facility] follow up as needed due to familial concerns.   Problems Addressed: Anemia, unspecified type: acute illness or injury    Details: Hgb 9.9 today. Fecal occult negative. Pt has had a quite a bit of bloodwork recently due to multiple ED visits and I suspect this is the cause. Gastroparesis: acute illness or injury    Details: Uncomplicated here in the ED. Has been able to tolerate fluids without difficulty. Stable for discharge home. Have recommended outpatient follow up with GI for further eval. He  reports he follows with Dr. Gala Romney. He is requesting pain medication to go home with. Do not feel Rx narcotics are appropriate for chronic pain. He has bentyl and reglan at home to take. Hypokalemia: acute illness or injury    Details: 2.7 here in the ED. 2 rounds IV K provided. Will discharge home with additional [place]. pt recommended to have potassium level rechecked in 1-2 weeks by PCP.  Amount and/or Complexity of Data Reviewed Labs: ordered. Decision-making details documented in ED Course.    Details: CBC without leukocytosis. Hgb 9.9. per chart review it was 11.4 four days ago. Pt unable to relay dark colored stool due to blindness. Will proceed with fecal occult testing at this time. Platelets 126 which appears baseline.   CMP with critical potassium 2.7. Will start on IV potassium at this time. EKG without hypokalemia changes. Creatinine 6.03 and BUN 22. Hx of dialysis. Has not missed any doses.  Lipase 27  EtOH negative  Fecal occult negative  Risk Prescription drug management.          Final Clinical Impression(s) / ED Diagnoses Final diagnoses:  Gastroparesis  Anemia, unspecified type  Hypokalemia    Rx / DC Orders ED Discharge Orders          Ordered    potassium chloride (KLOR-CON) 10 MEQ tablet  Daily        09/07/21 1625             Discharge Instructions      Please pick up potassium supplements and take as  prescribed. You will need to have your potassium level rechecked in 1-2 weeks. This can be done by your PCP.   Continue taking your Reglan and Bentyl for your gastroparesis. Please follow up with your gastroenterologist for further evaluation/to discuss other options for symptom control.   You can also follow up with the [facility] as needed for further evaluation of concern for behavioral changes per your family member today.   Return to the ED for any new/worsening symptoms       Eustaquio Maize, Hershal Coria 09/07/21  1628    Dorie Rank, MD 09/08/21 [phone]

## 2021-09-11 DIAGNOSIS — I96 Gangrene, not elsewhere classified: Secondary | ICD-10-CM | POA: Diagnosis not present

## 2021-09-11 DIAGNOSIS — Z992 Dependence on renal dialysis: Secondary | ICD-10-CM | POA: Diagnosis not present

## 2021-09-11 DIAGNOSIS — N2581 Secondary hyperparathyroidism of renal origin: Secondary | ICD-10-CM | POA: Diagnosis not present

## 2021-09-11 DIAGNOSIS — N186 End stage renal disease: Secondary | ICD-10-CM | POA: Diagnosis not present

## 2021-09-11 DIAGNOSIS — Z89412 Acquired absence of left great toe: Secondary | ICD-10-CM | POA: Diagnosis not present

## 2021-09-12 ENCOUNTER — Inpatient Hospital Stay (HOSPITAL_COMMUNITY): Admission: RE | Admit: 2021-09-12 | Payer: No Typology Code available for payment source | Source: Ambulatory Visit

## 2021-09-13 DIAGNOSIS — Z992 Dependence on renal dialysis: Secondary | ICD-10-CM | POA: Diagnosis not present

## 2021-09-13 DIAGNOSIS — N186 End stage renal disease: Secondary | ICD-10-CM | POA: Diagnosis not present

## 2021-09-13 DIAGNOSIS — Z89412 Acquired absence of left great toe: Secondary | ICD-10-CM | POA: Diagnosis not present

## 2021-09-13 DIAGNOSIS — I96 Gangrene, not elsewhere classified: Secondary | ICD-10-CM | POA: Diagnosis not present

## 2021-09-13 DIAGNOSIS — N2581 Secondary hyperparathyroidism of renal origin: Secondary | ICD-10-CM | POA: Diagnosis not present

## 2021-09-16 DIAGNOSIS — N2581 Secondary hyperparathyroidism of renal origin: Secondary | ICD-10-CM | POA: Diagnosis not present

## 2021-09-16 DIAGNOSIS — N186 End stage renal disease: Secondary | ICD-10-CM | POA: Diagnosis not present

## 2021-09-16 DIAGNOSIS — I96 Gangrene, not elsewhere classified: Secondary | ICD-10-CM | POA: Diagnosis not present

## 2021-09-16 DIAGNOSIS — Z992 Dependence on renal dialysis: Secondary | ICD-10-CM | POA: Diagnosis not present

## 2021-09-16 DIAGNOSIS — Z89412 Acquired absence of left great toe: Secondary | ICD-10-CM | POA: Diagnosis not present

## 2021-09-18 DIAGNOSIS — N2581 Secondary hyperparathyroidism of renal origin: Secondary | ICD-10-CM | POA: Diagnosis not present

## 2021-09-18 DIAGNOSIS — Z992 Dependence on renal dialysis: Secondary | ICD-10-CM | POA: Diagnosis not present

## 2021-09-18 DIAGNOSIS — I96 Gangrene, not elsewhere classified: Secondary | ICD-10-CM | POA: Diagnosis not present

## 2021-09-18 DIAGNOSIS — N186 End stage renal disease: Secondary | ICD-10-CM | POA: Diagnosis not present

## 2021-09-18 DIAGNOSIS — Z89412 Acquired absence of left great toe: Secondary | ICD-10-CM | POA: Diagnosis not present

## 2021-09-19 DIAGNOSIS — E1152 Type 2 diabetes mellitus with diabetic peripheral angiopathy with gangrene: Secondary | ICD-10-CM | POA: Diagnosis not present

## 2021-09-19 DIAGNOSIS — I96 Gangrene, not elsewhere classified: Secondary | ICD-10-CM | POA: Diagnosis not present

## 2021-09-19 DIAGNOSIS — E1165 Type 2 diabetes mellitus with hyperglycemia: Secondary | ICD-10-CM | POA: Diagnosis not present

## 2021-09-19 DIAGNOSIS — E1121 Type 2 diabetes mellitus with diabetic nephropathy: Secondary | ICD-10-CM | POA: Diagnosis not present

## 2021-09-19 DIAGNOSIS — I10 Essential (primary) hypertension: Secondary | ICD-10-CM | POA: Diagnosis not present

## 2021-09-19 DIAGNOSIS — I70262 Atherosclerosis of native arteries of extremities with gangrene, left leg: Secondary | ICD-10-CM | POA: Diagnosis not present

## 2021-09-19 DIAGNOSIS — A480 Gas gangrene: Secondary | ICD-10-CM | POA: Diagnosis not present

## 2021-09-19 DIAGNOSIS — R1084 Generalized abdominal pain: Secondary | ICD-10-CM | POA: Diagnosis not present

## 2021-09-19 DIAGNOSIS — Z89412 Acquired absence of left great toe: Secondary | ICD-10-CM | POA: Diagnosis not present

## 2021-09-19 DIAGNOSIS — R110 Nausea: Secondary | ICD-10-CM | POA: Diagnosis not present

## 2021-09-19 DIAGNOSIS — E1143 Type 2 diabetes mellitus with diabetic autonomic (poly)neuropathy: Secondary | ICD-10-CM | POA: Diagnosis not present

## 2021-09-19 DIAGNOSIS — Z992 Dependence on renal dialysis: Secondary | ICD-10-CM | POA: Diagnosis not present

## 2021-09-19 DIAGNOSIS — S98212A Complete traumatic amputation of two or more left lesser toes, initial encounter: Secondary | ICD-10-CM | POA: Diagnosis not present

## 2021-09-19 DIAGNOSIS — L089 Local infection of the skin and subcutaneous tissue, unspecified: Secondary | ICD-10-CM | POA: Diagnosis not present

## 2021-09-19 DIAGNOSIS — E11621 Type 2 diabetes mellitus with foot ulcer: Secondary | ICD-10-CM | POA: Diagnosis not present

## 2021-09-19 DIAGNOSIS — M868X7 Other osteomyelitis, ankle and foot: Secondary | ICD-10-CM | POA: Diagnosis not present

## 2021-09-19 DIAGNOSIS — E876 Hypokalemia: Secondary | ICD-10-CM | POA: Diagnosis not present

## 2021-09-19 DIAGNOSIS — R1111 Vomiting without nausea: Secondary | ICD-10-CM | POA: Diagnosis not present

## 2021-09-19 DIAGNOSIS — I120 Hypertensive chronic kidney disease with stage 5 chronic kidney disease or end stage renal disease: Secondary | ICD-10-CM | POA: Diagnosis not present

## 2021-09-19 DIAGNOSIS — R1013 Epigastric pain: Secondary | ICD-10-CM | POA: Diagnosis not present

## 2021-09-19 DIAGNOSIS — I7389 Other specified peripheral vascular diseases: Secondary | ICD-10-CM | POA: Diagnosis not present

## 2021-09-19 DIAGNOSIS — K3184 Gastroparesis: Secondary | ICD-10-CM | POA: Diagnosis not present

## 2021-09-19 DIAGNOSIS — E1122 Type 2 diabetes mellitus with diabetic chronic kidney disease: Secondary | ICD-10-CM | POA: Diagnosis not present

## 2021-09-19 DIAGNOSIS — R918 Other nonspecific abnormal finding of lung field: Secondary | ICD-10-CM | POA: Diagnosis not present

## 2021-09-19 DIAGNOSIS — A4189 Other specified sepsis: Secondary | ICD-10-CM | POA: Diagnosis not present

## 2021-09-19 DIAGNOSIS — R456 Violent behavior: Secondary | ICD-10-CM | POA: Diagnosis not present

## 2021-09-19 DIAGNOSIS — M86172 Other acute osteomyelitis, left ankle and foot: Secondary | ICD-10-CM | POA: Diagnosis not present

## 2021-09-19 DIAGNOSIS — K219 Gastro-esophageal reflux disease without esophagitis: Secondary | ICD-10-CM | POA: Diagnosis not present

## 2021-09-19 DIAGNOSIS — N186 End stage renal disease: Secondary | ICD-10-CM | POA: Diagnosis not present

## 2021-09-19 DIAGNOSIS — M869 Osteomyelitis, unspecified: Secondary | ICD-10-CM | POA: Diagnosis not present

## 2021-09-19 DIAGNOSIS — R079 Chest pain, unspecified: Secondary | ICD-10-CM | POA: Diagnosis not present

## 2021-09-19 DIAGNOSIS — R7881 Bacteremia: Secondary | ICD-10-CM | POA: Diagnosis not present

## 2021-09-19 DIAGNOSIS — E871 Hypo-osmolality and hyponatremia: Secondary | ICD-10-CM | POA: Diagnosis not present

## 2021-09-19 DIAGNOSIS — D631 Anemia in chronic kidney disease: Secondary | ICD-10-CM | POA: Diagnosis not present

## 2021-09-23 ENCOUNTER — Ambulatory Visit: Payer: No Typology Code available for payment source | Admitting: Podiatry

## 2021-10-01 DIAGNOSIS — I739 Peripheral vascular disease, unspecified: Secondary | ICD-10-CM | POA: Diagnosis not present

## 2021-10-01 DIAGNOSIS — K3184 Gastroparesis: Secondary | ICD-10-CM | POA: Diagnosis not present

## 2021-10-01 DIAGNOSIS — E1122 Type 2 diabetes mellitus with diabetic chronic kidney disease: Secondary | ICD-10-CM | POA: Diagnosis not present

## 2021-10-01 DIAGNOSIS — I120 Hypertensive chronic kidney disease with stage 5 chronic kidney disease or end stage renal disease: Secondary | ICD-10-CM | POA: Diagnosis not present

## 2021-10-01 DIAGNOSIS — R0689 Other abnormalities of breathing: Secondary | ICD-10-CM | POA: Diagnosis not present

## 2021-10-01 DIAGNOSIS — E785 Hyperlipidemia, unspecified: Secondary | ICD-10-CM | POA: Diagnosis not present

## 2021-10-01 DIAGNOSIS — H548 Legal blindness, as defined in USA: Secondary | ICD-10-CM | POA: Diagnosis not present

## 2021-10-01 DIAGNOSIS — Z794 Long term (current) use of insulin: Secondary | ICD-10-CM | POA: Diagnosis not present

## 2021-10-01 DIAGNOSIS — Z992 Dependence on renal dialysis: Secondary | ICD-10-CM | POA: Diagnosis not present

## 2021-10-01 DIAGNOSIS — R112 Nausea with vomiting, unspecified: Secondary | ICD-10-CM | POA: Diagnosis not present

## 2021-10-01 DIAGNOSIS — N186 End stage renal disease: Secondary | ICD-10-CM | POA: Diagnosis not present

## 2021-10-03 DIAGNOSIS — Z89422 Acquired absence of other left toe(s): Secondary | ICD-10-CM | POA: Diagnosis not present

## 2021-10-03 DIAGNOSIS — Z992 Dependence on renal dialysis: Secondary | ICD-10-CM | POA: Diagnosis not present

## 2021-10-03 DIAGNOSIS — F209 Schizophrenia, unspecified: Secondary | ICD-10-CM | POA: Diagnosis not present

## 2021-10-03 DIAGNOSIS — R109 Unspecified abdominal pain: Secondary | ICD-10-CM | POA: Diagnosis not present

## 2021-10-04 DIAGNOSIS — Z992 Dependence on renal dialysis: Secondary | ICD-10-CM | POA: Diagnosis not present

## 2021-10-04 DIAGNOSIS — I96 Gangrene, not elsewhere classified: Secondary | ICD-10-CM | POA: Diagnosis not present

## 2021-10-04 DIAGNOSIS — N2581 Secondary hyperparathyroidism of renal origin: Secondary | ICD-10-CM | POA: Diagnosis not present

## 2021-10-04 DIAGNOSIS — Z89412 Acquired absence of left great toe: Secondary | ICD-10-CM | POA: Diagnosis not present

## 2021-10-04 DIAGNOSIS — N186 End stage renal disease: Secondary | ICD-10-CM | POA: Diagnosis not present

## 2021-10-05 DIAGNOSIS — R531 Weakness: Secondary | ICD-10-CM | POA: Diagnosis not present

## 2021-10-05 DIAGNOSIS — E876 Hypokalemia: Secondary | ICD-10-CM | POA: Diagnosis not present

## 2021-10-05 DIAGNOSIS — R1111 Vomiting without nausea: Secondary | ICD-10-CM | POA: Diagnosis not present

## 2021-10-05 DIAGNOSIS — R6521 Severe sepsis with septic shock: Secondary | ICD-10-CM | POA: Diagnosis not present

## 2021-10-05 DIAGNOSIS — R0989 Other specified symptoms and signs involving the circulatory and respiratory systems: Secondary | ICD-10-CM | POA: Diagnosis not present

## 2021-10-05 DIAGNOSIS — R739 Hyperglycemia, unspecified: Secondary | ICD-10-CM | POA: Diagnosis not present

## 2021-10-05 DIAGNOSIS — D500 Iron deficiency anemia secondary to blood loss (chronic): Secondary | ICD-10-CM | POA: Diagnosis not present

## 2021-10-05 DIAGNOSIS — A419 Sepsis, unspecified organism: Secondary | ICD-10-CM | POA: Diagnosis not present

## 2021-10-05 DIAGNOSIS — R110 Nausea: Secondary | ICD-10-CM | POA: Diagnosis not present

## 2021-10-05 DIAGNOSIS — M86172 Other acute osteomyelitis, left ankle and foot: Secondary | ICD-10-CM | POA: Diagnosis not present

## 2021-10-05 DIAGNOSIS — E1152 Type 2 diabetes mellitus with diabetic peripheral angiopathy with gangrene: Secondary | ICD-10-CM | POA: Diagnosis not present

## 2021-10-05 DIAGNOSIS — I7389 Other specified peripheral vascular diseases: Secondary | ICD-10-CM | POA: Diagnosis not present

## 2021-10-05 DIAGNOSIS — I10 Essential (primary) hypertension: Secondary | ICD-10-CM | POA: Diagnosis not present

## 2021-10-05 DIAGNOSIS — E1122 Type 2 diabetes mellitus with diabetic chronic kidney disease: Secondary | ICD-10-CM | POA: Diagnosis not present

## 2021-10-05 DIAGNOSIS — R112 Nausea with vomiting, unspecified: Secondary | ICD-10-CM | POA: Diagnosis not present

## 2021-10-05 DIAGNOSIS — E440 Moderate protein-calorie malnutrition: Secondary | ICD-10-CM | POA: Diagnosis not present

## 2021-10-05 DIAGNOSIS — E1165 Type 2 diabetes mellitus with hyperglycemia: Secondary | ICD-10-CM | POA: Diagnosis not present

## 2021-10-05 DIAGNOSIS — R42 Dizziness and giddiness: Secondary | ICD-10-CM | POA: Diagnosis not present

## 2021-10-05 DIAGNOSIS — E1129 Type 2 diabetes mellitus with other diabetic kidney complication: Secondary | ICD-10-CM | POA: Diagnosis not present

## 2021-10-05 DIAGNOSIS — D631 Anemia in chronic kidney disease: Secondary | ICD-10-CM | POA: Diagnosis not present

## 2021-10-05 DIAGNOSIS — E1140 Type 2 diabetes mellitus with diabetic neuropathy, unspecified: Secondary | ICD-10-CM | POA: Diagnosis not present

## 2021-10-05 DIAGNOSIS — E113293 Type 2 diabetes mellitus with mild nonproliferative diabetic retinopathy without macular edema, bilateral: Secondary | ICD-10-CM | POA: Diagnosis not present

## 2021-10-05 DIAGNOSIS — D649 Anemia, unspecified: Secondary | ICD-10-CM | POA: Diagnosis not present

## 2021-10-05 DIAGNOSIS — E11621 Type 2 diabetes mellitus with foot ulcer: Secondary | ICD-10-CM | POA: Diagnosis not present

## 2021-10-05 DIAGNOSIS — R7881 Bacteremia: Secondary | ICD-10-CM | POA: Diagnosis not present

## 2021-10-05 DIAGNOSIS — L97524 Non-pressure chronic ulcer of other part of left foot with necrosis of bone: Secondary | ICD-10-CM | POA: Diagnosis not present

## 2021-10-05 DIAGNOSIS — M86672 Other chronic osteomyelitis, left ankle and foot: Secondary | ICD-10-CM | POA: Diagnosis not present

## 2021-10-05 DIAGNOSIS — A4102 Sepsis due to Methicillin resistant Staphylococcus aureus: Secondary | ICD-10-CM | POA: Diagnosis not present

## 2021-10-05 DIAGNOSIS — E1143 Type 2 diabetes mellitus with diabetic autonomic (poly)neuropathy: Secondary | ICD-10-CM | POA: Diagnosis not present

## 2021-10-05 DIAGNOSIS — E1121 Type 2 diabetes mellitus with diabetic nephropathy: Secondary | ICD-10-CM | POA: Diagnosis not present

## 2021-10-05 DIAGNOSIS — N189 Chronic kidney disease, unspecified: Secondary | ICD-10-CM | POA: Diagnosis not present

## 2021-10-05 DIAGNOSIS — E871 Hypo-osmolality and hyponatremia: Secondary | ICD-10-CM | POA: Diagnosis not present

## 2021-10-05 DIAGNOSIS — S91302A Unspecified open wound, left foot, initial encounter: Secondary | ICD-10-CM | POA: Diagnosis not present

## 2021-10-05 DIAGNOSIS — I96 Gangrene, not elsewhere classified: Secondary | ICD-10-CM | POA: Diagnosis not present

## 2021-10-05 DIAGNOSIS — N186 End stage renal disease: Secondary | ICD-10-CM | POA: Diagnosis not present

## 2021-10-05 DIAGNOSIS — E11628 Type 2 diabetes mellitus with other skin complications: Secondary | ICD-10-CM | POA: Diagnosis not present

## 2021-10-05 DIAGNOSIS — Z992 Dependence on renal dialysis: Secondary | ICD-10-CM | POA: Diagnosis not present

## 2021-10-05 DIAGNOSIS — D62 Acute posthemorrhagic anemia: Secondary | ICD-10-CM | POA: Diagnosis not present

## 2021-10-05 DIAGNOSIS — I120 Hypertensive chronic kidney disease with stage 5 chronic kidney disease or end stage renal disease: Secondary | ICD-10-CM | POA: Diagnosis not present

## 2021-10-05 DIAGNOSIS — G9341 Metabolic encephalopathy: Secondary | ICD-10-CM | POA: Diagnosis not present

## 2021-10-06 DIAGNOSIS — A419 Sepsis, unspecified organism: Secondary | ICD-10-CM | POA: Diagnosis not present

## 2021-10-06 DIAGNOSIS — D649 Anemia, unspecified: Secondary | ICD-10-CM | POA: Diagnosis not present

## 2021-10-06 DIAGNOSIS — N189 Chronic kidney disease, unspecified: Secondary | ICD-10-CM | POA: Diagnosis not present

## 2021-10-06 DIAGNOSIS — N186 End stage renal disease: Secondary | ICD-10-CM | POA: Diagnosis not present

## 2021-10-06 DIAGNOSIS — E876 Hypokalemia: Secondary | ICD-10-CM | POA: Diagnosis not present

## 2021-10-06 DIAGNOSIS — R6521 Severe sepsis with septic shock: Secondary | ICD-10-CM | POA: Diagnosis not present

## 2021-10-06 DIAGNOSIS — D500 Iron deficiency anemia secondary to blood loss (chronic): Secondary | ICD-10-CM | POA: Diagnosis not present

## 2021-10-07 DIAGNOSIS — E871 Hypo-osmolality and hyponatremia: Secondary | ICD-10-CM | POA: Diagnosis not present

## 2021-10-07 DIAGNOSIS — A419 Sepsis, unspecified organism: Secondary | ICD-10-CM | POA: Diagnosis not present

## 2021-10-07 DIAGNOSIS — D500 Iron deficiency anemia secondary to blood loss (chronic): Secondary | ICD-10-CM | POA: Diagnosis not present

## 2021-10-07 DIAGNOSIS — N189 Chronic kidney disease, unspecified: Secondary | ICD-10-CM | POA: Diagnosis not present

## 2021-10-07 DIAGNOSIS — E876 Hypokalemia: Secondary | ICD-10-CM | POA: Diagnosis not present

## 2021-10-07 DIAGNOSIS — N186 End stage renal disease: Secondary | ICD-10-CM | POA: Diagnosis not present

## 2021-10-07 DIAGNOSIS — R6521 Severe sepsis with septic shock: Secondary | ICD-10-CM | POA: Diagnosis not present

## 2021-10-07 DIAGNOSIS — E1165 Type 2 diabetes mellitus with hyperglycemia: Secondary | ICD-10-CM | POA: Diagnosis not present

## 2021-10-07 DIAGNOSIS — R7881 Bacteremia: Secondary | ICD-10-CM | POA: Diagnosis not present

## 2021-10-07 DIAGNOSIS — D631 Anemia in chronic kidney disease: Secondary | ICD-10-CM | POA: Diagnosis not present

## 2021-10-07 DIAGNOSIS — Z992 Dependence on renal dialysis: Secondary | ICD-10-CM | POA: Diagnosis not present

## 2021-10-07 DIAGNOSIS — E1121 Type 2 diabetes mellitus with diabetic nephropathy: Secondary | ICD-10-CM | POA: Diagnosis not present

## 2021-10-07 DIAGNOSIS — E1143 Type 2 diabetes mellitus with diabetic autonomic (poly)neuropathy: Secondary | ICD-10-CM | POA: Diagnosis not present

## 2021-10-08 DIAGNOSIS — E1143 Type 2 diabetes mellitus with diabetic autonomic (poly)neuropathy: Secondary | ICD-10-CM | POA: Diagnosis not present

## 2021-10-08 DIAGNOSIS — R6521 Severe sepsis with septic shock: Secondary | ICD-10-CM | POA: Diagnosis not present

## 2021-10-08 DIAGNOSIS — D500 Iron deficiency anemia secondary to blood loss (chronic): Secondary | ICD-10-CM | POA: Diagnosis not present

## 2021-10-08 DIAGNOSIS — E1121 Type 2 diabetes mellitus with diabetic nephropathy: Secondary | ICD-10-CM | POA: Diagnosis not present

## 2021-10-08 DIAGNOSIS — N186 End stage renal disease: Secondary | ICD-10-CM | POA: Diagnosis not present

## 2021-10-08 DIAGNOSIS — Z992 Dependence on renal dialysis: Secondary | ICD-10-CM | POA: Diagnosis not present

## 2021-10-08 DIAGNOSIS — E876 Hypokalemia: Secondary | ICD-10-CM | POA: Diagnosis not present

## 2021-10-08 DIAGNOSIS — E1165 Type 2 diabetes mellitus with hyperglycemia: Secondary | ICD-10-CM | POA: Diagnosis not present

## 2021-10-08 DIAGNOSIS — E871 Hypo-osmolality and hyponatremia: Secondary | ICD-10-CM | POA: Diagnosis not present

## 2021-10-08 DIAGNOSIS — R7881 Bacteremia: Secondary | ICD-10-CM | POA: Diagnosis not present

## 2021-10-08 DIAGNOSIS — D631 Anemia in chronic kidney disease: Secondary | ICD-10-CM | POA: Diagnosis not present

## 2021-10-08 DIAGNOSIS — N189 Chronic kidney disease, unspecified: Secondary | ICD-10-CM | POA: Diagnosis not present

## 2021-10-08 DIAGNOSIS — A419 Sepsis, unspecified organism: Secondary | ICD-10-CM | POA: Diagnosis not present

## 2021-10-09 DIAGNOSIS — E1121 Type 2 diabetes mellitus with diabetic nephropathy: Secondary | ICD-10-CM | POA: Diagnosis not present

## 2021-10-09 DIAGNOSIS — E876 Hypokalemia: Secondary | ICD-10-CM | POA: Diagnosis not present

## 2021-10-09 DIAGNOSIS — D631 Anemia in chronic kidney disease: Secondary | ICD-10-CM | POA: Diagnosis not present

## 2021-10-09 DIAGNOSIS — N186 End stage renal disease: Secondary | ICD-10-CM | POA: Diagnosis not present

## 2021-10-09 DIAGNOSIS — E1143 Type 2 diabetes mellitus with diabetic autonomic (poly)neuropathy: Secondary | ICD-10-CM | POA: Diagnosis not present

## 2021-10-09 DIAGNOSIS — R7881 Bacteremia: Secondary | ICD-10-CM | POA: Diagnosis not present

## 2021-10-09 DIAGNOSIS — R6521 Severe sepsis with septic shock: Secondary | ICD-10-CM | POA: Diagnosis not present

## 2021-10-09 DIAGNOSIS — N189 Chronic kidney disease, unspecified: Secondary | ICD-10-CM | POA: Diagnosis not present

## 2021-10-09 DIAGNOSIS — E1165 Type 2 diabetes mellitus with hyperglycemia: Secondary | ICD-10-CM | POA: Diagnosis not present

## 2021-10-09 DIAGNOSIS — A419 Sepsis, unspecified organism: Secondary | ICD-10-CM | POA: Diagnosis not present

## 2021-10-09 DIAGNOSIS — Z992 Dependence on renal dialysis: Secondary | ICD-10-CM | POA: Diagnosis not present

## 2021-10-09 DIAGNOSIS — D500 Iron deficiency anemia secondary to blood loss (chronic): Secondary | ICD-10-CM | POA: Diagnosis not present

## 2021-10-09 DIAGNOSIS — E871 Hypo-osmolality and hyponatremia: Secondary | ICD-10-CM | POA: Diagnosis not present

## 2021-10-10 DIAGNOSIS — N189 Chronic kidney disease, unspecified: Secondary | ICD-10-CM | POA: Diagnosis not present

## 2021-10-10 DIAGNOSIS — E1121 Type 2 diabetes mellitus with diabetic nephropathy: Secondary | ICD-10-CM | POA: Diagnosis not present

## 2021-10-10 DIAGNOSIS — D631 Anemia in chronic kidney disease: Secondary | ICD-10-CM | POA: Diagnosis not present

## 2021-10-10 DIAGNOSIS — R7881 Bacteremia: Secondary | ICD-10-CM | POA: Diagnosis not present

## 2021-10-10 DIAGNOSIS — D500 Iron deficiency anemia secondary to blood loss (chronic): Secondary | ICD-10-CM | POA: Diagnosis not present

## 2021-10-10 DIAGNOSIS — E1165 Type 2 diabetes mellitus with hyperglycemia: Secondary | ICD-10-CM | POA: Diagnosis not present

## 2021-10-10 DIAGNOSIS — N186 End stage renal disease: Secondary | ICD-10-CM | POA: Diagnosis not present

## 2021-10-10 DIAGNOSIS — S91302A Unspecified open wound, left foot, initial encounter: Secondary | ICD-10-CM | POA: Diagnosis not present

## 2021-10-10 DIAGNOSIS — E1143 Type 2 diabetes mellitus with diabetic autonomic (poly)neuropathy: Secondary | ICD-10-CM | POA: Diagnosis not present

## 2021-10-10 DIAGNOSIS — E11621 Type 2 diabetes mellitus with foot ulcer: Secondary | ICD-10-CM | POA: Diagnosis not present

## 2021-10-10 DIAGNOSIS — A419 Sepsis, unspecified organism: Secondary | ICD-10-CM | POA: Diagnosis not present

## 2021-10-10 DIAGNOSIS — Z992 Dependence on renal dialysis: Secondary | ICD-10-CM | POA: Diagnosis not present

## 2021-10-10 DIAGNOSIS — R6521 Severe sepsis with septic shock: Secondary | ICD-10-CM | POA: Diagnosis not present

## 2021-10-10 DIAGNOSIS — E876 Hypokalemia: Secondary | ICD-10-CM | POA: Diagnosis not present

## 2021-10-10 DIAGNOSIS — E871 Hypo-osmolality and hyponatremia: Secondary | ICD-10-CM | POA: Diagnosis not present

## 2021-10-11 DIAGNOSIS — D500 Iron deficiency anemia secondary to blood loss (chronic): Secondary | ICD-10-CM | POA: Diagnosis not present

## 2021-10-11 DIAGNOSIS — E871 Hypo-osmolality and hyponatremia: Secondary | ICD-10-CM | POA: Diagnosis not present

## 2021-10-11 DIAGNOSIS — Z992 Dependence on renal dialysis: Secondary | ICD-10-CM | POA: Diagnosis not present

## 2021-10-11 DIAGNOSIS — E1143 Type 2 diabetes mellitus with diabetic autonomic (poly)neuropathy: Secondary | ICD-10-CM | POA: Diagnosis not present

## 2021-10-11 DIAGNOSIS — R6521 Severe sepsis with septic shock: Secondary | ICD-10-CM | POA: Diagnosis not present

## 2021-10-11 DIAGNOSIS — N189 Chronic kidney disease, unspecified: Secondary | ICD-10-CM | POA: Diagnosis not present

## 2021-10-11 DIAGNOSIS — E1121 Type 2 diabetes mellitus with diabetic nephropathy: Secondary | ICD-10-CM | POA: Diagnosis not present

## 2021-10-11 DIAGNOSIS — D631 Anemia in chronic kidney disease: Secondary | ICD-10-CM | POA: Diagnosis not present

## 2021-10-11 DIAGNOSIS — E876 Hypokalemia: Secondary | ICD-10-CM | POA: Diagnosis not present

## 2021-10-11 DIAGNOSIS — N186 End stage renal disease: Secondary | ICD-10-CM | POA: Diagnosis not present

## 2021-10-11 DIAGNOSIS — A419 Sepsis, unspecified organism: Secondary | ICD-10-CM | POA: Diagnosis not present

## 2021-10-11 DIAGNOSIS — E1165 Type 2 diabetes mellitus with hyperglycemia: Secondary | ICD-10-CM | POA: Diagnosis not present

## 2021-10-11 DIAGNOSIS — R7881 Bacteremia: Secondary | ICD-10-CM | POA: Diagnosis not present

## 2021-10-12 DIAGNOSIS — E1121 Type 2 diabetes mellitus with diabetic nephropathy: Secondary | ICD-10-CM | POA: Diagnosis not present

## 2021-10-12 DIAGNOSIS — E871 Hypo-osmolality and hyponatremia: Secondary | ICD-10-CM | POA: Diagnosis not present

## 2021-10-12 DIAGNOSIS — R7881 Bacteremia: Secondary | ICD-10-CM | POA: Diagnosis not present

## 2021-10-12 DIAGNOSIS — A419 Sepsis, unspecified organism: Secondary | ICD-10-CM | POA: Diagnosis not present

## 2021-10-12 DIAGNOSIS — E1143 Type 2 diabetes mellitus with diabetic autonomic (poly)neuropathy: Secondary | ICD-10-CM | POA: Diagnosis not present

## 2021-10-12 DIAGNOSIS — N189 Chronic kidney disease, unspecified: Secondary | ICD-10-CM | POA: Diagnosis not present

## 2021-10-12 DIAGNOSIS — R6521 Severe sepsis with septic shock: Secondary | ICD-10-CM | POA: Diagnosis not present

## 2021-10-12 DIAGNOSIS — E876 Hypokalemia: Secondary | ICD-10-CM | POA: Diagnosis not present

## 2021-10-12 DIAGNOSIS — Z992 Dependence on renal dialysis: Secondary | ICD-10-CM | POA: Diagnosis not present

## 2021-10-12 DIAGNOSIS — D500 Iron deficiency anemia secondary to blood loss (chronic): Secondary | ICD-10-CM | POA: Diagnosis not present

## 2021-10-12 DIAGNOSIS — E1165 Type 2 diabetes mellitus with hyperglycemia: Secondary | ICD-10-CM | POA: Diagnosis not present

## 2021-10-12 DIAGNOSIS — N186 End stage renal disease: Secondary | ICD-10-CM | POA: Diagnosis not present

## 2021-10-12 DIAGNOSIS — D631 Anemia in chronic kidney disease: Secondary | ICD-10-CM | POA: Diagnosis not present

## 2021-10-13 DIAGNOSIS — N189 Chronic kidney disease, unspecified: Secondary | ICD-10-CM | POA: Diagnosis not present

## 2021-10-13 DIAGNOSIS — A419 Sepsis, unspecified organism: Secondary | ICD-10-CM | POA: Diagnosis not present

## 2021-10-13 DIAGNOSIS — D500 Iron deficiency anemia secondary to blood loss (chronic): Secondary | ICD-10-CM | POA: Diagnosis not present

## 2021-10-13 DIAGNOSIS — R6521 Severe sepsis with septic shock: Secondary | ICD-10-CM | POA: Diagnosis not present

## 2021-10-14 DIAGNOSIS — N186 End stage renal disease: Secondary | ICD-10-CM | POA: Diagnosis not present

## 2021-10-14 DIAGNOSIS — I10 Essential (primary) hypertension: Secondary | ICD-10-CM | POA: Diagnosis not present

## 2021-10-14 DIAGNOSIS — Z992 Dependence on renal dialysis: Secondary | ICD-10-CM | POA: Diagnosis not present

## 2021-10-14 DIAGNOSIS — D649 Anemia, unspecified: Secondary | ICD-10-CM | POA: Diagnosis not present

## 2021-10-14 DIAGNOSIS — D500 Iron deficiency anemia secondary to blood loss (chronic): Secondary | ICD-10-CM | POA: Diagnosis not present

## 2021-10-14 DIAGNOSIS — R6521 Severe sepsis with septic shock: Secondary | ICD-10-CM | POA: Diagnosis not present

## 2021-10-14 DIAGNOSIS — N189 Chronic kidney disease, unspecified: Secondary | ICD-10-CM | POA: Diagnosis not present

## 2021-10-14 DIAGNOSIS — A419 Sepsis, unspecified organism: Secondary | ICD-10-CM | POA: Diagnosis not present

## 2021-10-15 DIAGNOSIS — N189 Chronic kidney disease, unspecified: Secondary | ICD-10-CM | POA: Diagnosis not present

## 2021-10-15 DIAGNOSIS — E1122 Type 2 diabetes mellitus with diabetic chronic kidney disease: Secondary | ICD-10-CM | POA: Diagnosis not present

## 2021-10-15 DIAGNOSIS — R6521 Severe sepsis with septic shock: Secondary | ICD-10-CM | POA: Diagnosis not present

## 2021-10-15 DIAGNOSIS — N186 End stage renal disease: Secondary | ICD-10-CM | POA: Diagnosis not present

## 2021-10-15 DIAGNOSIS — E11621 Type 2 diabetes mellitus with foot ulcer: Secondary | ICD-10-CM | POA: Diagnosis not present

## 2021-10-15 DIAGNOSIS — M86672 Other chronic osteomyelitis, left ankle and foot: Secondary | ICD-10-CM | POA: Diagnosis not present

## 2021-10-15 DIAGNOSIS — A419 Sepsis, unspecified organism: Secondary | ICD-10-CM | POA: Diagnosis not present

## 2021-10-15 DIAGNOSIS — D500 Iron deficiency anemia secondary to blood loss (chronic): Secondary | ICD-10-CM | POA: Diagnosis not present

## 2021-10-15 DIAGNOSIS — L97524 Non-pressure chronic ulcer of other part of left foot with necrosis of bone: Secondary | ICD-10-CM | POA: Diagnosis not present

## 2021-10-15 DIAGNOSIS — I7389 Other specified peripheral vascular diseases: Secondary | ICD-10-CM | POA: Diagnosis not present

## 2021-10-15 DIAGNOSIS — E1140 Type 2 diabetes mellitus with diabetic neuropathy, unspecified: Secondary | ICD-10-CM | POA: Diagnosis not present

## 2021-10-15 DIAGNOSIS — I96 Gangrene, not elsewhere classified: Secondary | ICD-10-CM | POA: Diagnosis not present

## 2021-10-15 DIAGNOSIS — M86172 Other acute osteomyelitis, left ankle and foot: Secondary | ICD-10-CM | POA: Diagnosis not present

## 2021-10-16 DIAGNOSIS — E1143 Type 2 diabetes mellitus with diabetic autonomic (poly)neuropathy: Secondary | ICD-10-CM | POA: Diagnosis not present

## 2021-10-16 DIAGNOSIS — R6521 Severe sepsis with septic shock: Secondary | ICD-10-CM | POA: Diagnosis not present

## 2021-10-16 DIAGNOSIS — N186 End stage renal disease: Secondary | ICD-10-CM | POA: Diagnosis not present

## 2021-10-16 DIAGNOSIS — E1165 Type 2 diabetes mellitus with hyperglycemia: Secondary | ICD-10-CM | POA: Diagnosis not present

## 2021-10-16 DIAGNOSIS — Z992 Dependence on renal dialysis: Secondary | ICD-10-CM | POA: Diagnosis not present

## 2021-10-16 DIAGNOSIS — N189 Chronic kidney disease, unspecified: Secondary | ICD-10-CM | POA: Diagnosis not present

## 2021-10-16 DIAGNOSIS — A419 Sepsis, unspecified organism: Secondary | ICD-10-CM | POA: Diagnosis not present

## 2021-10-16 DIAGNOSIS — E876 Hypokalemia: Secondary | ICD-10-CM | POA: Diagnosis not present

## 2021-10-16 DIAGNOSIS — E1121 Type 2 diabetes mellitus with diabetic nephropathy: Secondary | ICD-10-CM | POA: Diagnosis not present

## 2021-10-16 DIAGNOSIS — D500 Iron deficiency anemia secondary to blood loss (chronic): Secondary | ICD-10-CM | POA: Diagnosis not present

## 2021-10-16 DIAGNOSIS — E871 Hypo-osmolality and hyponatremia: Secondary | ICD-10-CM | POA: Diagnosis not present

## 2021-10-16 DIAGNOSIS — D631 Anemia in chronic kidney disease: Secondary | ICD-10-CM | POA: Diagnosis not present

## 2021-10-17 DIAGNOSIS — K219 Gastro-esophageal reflux disease without esophagitis: Secondary | ICD-10-CM | POA: Diagnosis not present

## 2021-10-17 DIAGNOSIS — E1140 Type 2 diabetes mellitus with diabetic neuropathy, unspecified: Secondary | ICD-10-CM | POA: Diagnosis not present

## 2021-10-17 DIAGNOSIS — G9341 Metabolic encephalopathy: Secondary | ICD-10-CM | POA: Diagnosis not present

## 2021-10-17 DIAGNOSIS — Z89412 Acquired absence of left great toe: Secondary | ICD-10-CM | POA: Diagnosis not present

## 2021-10-17 DIAGNOSIS — Z1159 Encounter for screening for other viral diseases: Secondary | ICD-10-CM | POA: Diagnosis not present

## 2021-10-17 DIAGNOSIS — E11649 Type 2 diabetes mellitus with hypoglycemia without coma: Secondary | ICD-10-CM | POA: Diagnosis not present

## 2021-10-17 DIAGNOSIS — N2581 Secondary hyperparathyroidism of renal origin: Secondary | ICD-10-CM | POA: Diagnosis not present

## 2021-10-17 DIAGNOSIS — E113293 Type 2 diabetes mellitus with mild nonproliferative diabetic retinopathy without macular edema, bilateral: Secondary | ICD-10-CM | POA: Diagnosis not present

## 2021-10-17 DIAGNOSIS — L97423 Non-pressure chronic ulcer of left heel and midfoot with necrosis of muscle: Secondary | ICD-10-CM | POA: Diagnosis not present

## 2021-10-17 DIAGNOSIS — E1121 Type 2 diabetes mellitus with diabetic nephropathy: Secondary | ICD-10-CM | POA: Diagnosis not present

## 2021-10-17 DIAGNOSIS — K089 Disorder of teeth and supporting structures, unspecified: Secondary | ICD-10-CM | POA: Diagnosis not present

## 2021-10-17 DIAGNOSIS — I469 Cardiac arrest, cause unspecified: Secondary | ICD-10-CM | POA: Diagnosis not present

## 2021-10-17 DIAGNOSIS — R6521 Severe sepsis with septic shock: Secondary | ICD-10-CM | POA: Diagnosis not present

## 2021-10-17 DIAGNOSIS — R112 Nausea with vomiting, unspecified: Secondary | ICD-10-CM | POA: Diagnosis not present

## 2021-10-17 DIAGNOSIS — R531 Weakness: Secondary | ICD-10-CM | POA: Diagnosis not present

## 2021-10-17 DIAGNOSIS — F39 Unspecified mood [affective] disorder: Secondary | ICD-10-CM | POA: Diagnosis not present

## 2021-10-17 DIAGNOSIS — I499 Cardiac arrhythmia, unspecified: Secondary | ICD-10-CM | POA: Diagnosis not present

## 2021-10-17 DIAGNOSIS — A419 Sepsis, unspecified organism: Secondary | ICD-10-CM | POA: Diagnosis not present

## 2021-10-17 DIAGNOSIS — E11621 Type 2 diabetes mellitus with foot ulcer: Secondary | ICD-10-CM | POA: Diagnosis not present

## 2021-10-17 DIAGNOSIS — R4020 Unspecified coma: Secondary | ICD-10-CM | POA: Diagnosis not present

## 2021-10-17 DIAGNOSIS — Z794 Long term (current) use of insulin: Secondary | ICD-10-CM | POA: Diagnosis not present

## 2021-10-17 DIAGNOSIS — T8744 Infection of amputation stump, left lower extremity: Secondary | ICD-10-CM | POA: Diagnosis not present

## 2021-10-17 DIAGNOSIS — R404 Transient alteration of awareness: Secondary | ICD-10-CM | POA: Diagnosis not present

## 2021-10-17 DIAGNOSIS — E119 Type 2 diabetes mellitus without complications: Secondary | ICD-10-CM | POA: Diagnosis not present

## 2021-10-17 DIAGNOSIS — N186 End stage renal disease: Secondary | ICD-10-CM | POA: Diagnosis not present

## 2021-10-17 DIAGNOSIS — N189 Chronic kidney disease, unspecified: Secondary | ICD-10-CM | POA: Diagnosis not present

## 2021-10-17 DIAGNOSIS — D500 Iron deficiency anemia secondary to blood loss (chronic): Secondary | ICD-10-CM | POA: Diagnosis not present

## 2021-10-17 DIAGNOSIS — E11628 Type 2 diabetes mellitus with other skin complications: Secondary | ICD-10-CM | POA: Diagnosis not present

## 2021-10-17 DIAGNOSIS — L97519 Non-pressure chronic ulcer of other part of right foot with unspecified severity: Secondary | ICD-10-CM | POA: Diagnosis not present

## 2021-10-17 DIAGNOSIS — D649 Anemia, unspecified: Secondary | ICD-10-CM | POA: Diagnosis not present

## 2021-10-17 DIAGNOSIS — B957 Other staphylococcus as the cause of diseases classified elsewhere: Secondary | ICD-10-CM | POA: Diagnosis not present

## 2021-10-17 DIAGNOSIS — Z992 Dependence on renal dialysis: Secondary | ICD-10-CM | POA: Diagnosis not present

## 2021-10-17 DIAGNOSIS — E1129 Type 2 diabetes mellitus with other diabetic kidney complication: Secondary | ICD-10-CM | POA: Diagnosis not present

## 2021-10-18 DIAGNOSIS — N186 End stage renal disease: Secondary | ICD-10-CM | POA: Diagnosis not present

## 2021-10-18 DIAGNOSIS — T8744 Infection of amputation stump, left lower extremity: Secondary | ICD-10-CM | POA: Diagnosis not present

## 2021-10-18 DIAGNOSIS — Z794 Long term (current) use of insulin: Secondary | ICD-10-CM | POA: Diagnosis not present

## 2021-10-18 DIAGNOSIS — Z992 Dependence on renal dialysis: Secondary | ICD-10-CM | POA: Diagnosis not present

## 2021-10-18 DIAGNOSIS — B957 Other staphylococcus as the cause of diseases classified elsewhere: Secondary | ICD-10-CM | POA: Diagnosis not present

## 2021-10-18 DIAGNOSIS — E1121 Type 2 diabetes mellitus with diabetic nephropathy: Secondary | ICD-10-CM | POA: Diagnosis not present

## 2021-10-18 DIAGNOSIS — K219 Gastro-esophageal reflux disease without esophagitis: Secondary | ICD-10-CM | POA: Diagnosis not present

## 2021-10-18 DIAGNOSIS — N2581 Secondary hyperparathyroidism of renal origin: Secondary | ICD-10-CM | POA: Diagnosis not present

## 2021-10-21 DIAGNOSIS — Z992 Dependence on renal dialysis: Secondary | ICD-10-CM | POA: Diagnosis not present

## 2021-10-21 DIAGNOSIS — T8744 Infection of amputation stump, left lower extremity: Secondary | ICD-10-CM | POA: Diagnosis not present

## 2021-10-21 DIAGNOSIS — B957 Other staphylococcus as the cause of diseases classified elsewhere: Secondary | ICD-10-CM | POA: Diagnosis not present

## 2021-10-21 DIAGNOSIS — N186 End stage renal disease: Secondary | ICD-10-CM | POA: Diagnosis not present

## 2021-10-21 DIAGNOSIS — N2581 Secondary hyperparathyroidism of renal origin: Secondary | ICD-10-CM | POA: Diagnosis not present

## 2021-10-22 ENCOUNTER — Encounter: Payer: Self-pay | Admitting: Internal Medicine

## 2021-10-22 ENCOUNTER — Ambulatory Visit: Payer: Medicare HMO | Admitting: Gastroenterology

## 2021-10-22 DIAGNOSIS — E1121 Type 2 diabetes mellitus with diabetic nephropathy: Secondary | ICD-10-CM | POA: Diagnosis not present

## 2021-10-22 NOTE — Progress Notes (Deleted)
Primary Care Physician:  [name], MD ? ?Primary Gastroenterologist:  Garfield Cornea, MD ? ? ?No chief complaint on file. ? ? ?HPI:  Timothy Clark is a 51 y.o. male here to schedule***last seen in December 2021 for follow-up of gastroparesis and GERD. History of chronic abdominal pain, constipation, GERD, esophagitis, gastritis, suspected gastroparesis, recurrent nausea and vomiting, H. pylori s/p eradication in 2016. GES on 06/15/2015 with rapid gastric transit.  [facility] questioned dumping syndrome, but EGD at [facility] January 2017 with mild esophagitis and old gastric contents.  Felt clinically consistent with gastroparesis.  RUQ ultrasound March 2019 normal.  CT November 2019 with fatty liver, gallbladder sludge.  HIDA scan normal in June 2020. No prior colonoscopy. Also with history of type 2 diabetes, diastolic dysfunction, end-stage renal disease on hemodialysis. ? ?At last office visit he was ready to schedule his first ever colonoscopy.  Also stated that this was a requirement for his kidney transplant evaluation. ? ?Never scheduled his colonoscopy, we reached out over the phone to schedule without a response.  Letter was sent February 2022 after not being able to reach him and he finally called in September to make arrangements.  He is brought in for reassessment given last visit was in December 2021. ? ? ?Hemoglobin: 07/2021, 13.1. 08/22/21, 13.7. 09/03/21, 11.4. 09/07/21, 9.9.  ?Labs January 2023: Total bilirubin 0.9, alkaline phosphatase 58, AST 29, ALT 23.  Lipase 102 mildly elevated. ? ?CT abdomen pelvis without contrast September 03, 2021: Bilateral renal parenchymal atrophy and cortical thinning consistent with known chronic renal disease.  Fat-containing right inguinal hernia, minimal fat in the left inguinal canal.  Mild fatty pancreatic atrophy. ? ?Current Outpatient Medications  ?Medication Sig Dispense Refill  ? ALPRAZolam (XANAX) 0.5 MG tablet Take 0.5 mg by mouth 2 (two) times daily as needed  for anxiety.    ? amLODipine (NORVASC) 5 MG tablet Take 5 mg by mouth daily.    ? atorvastatin (LIPITOR) 10 MG tablet Take 1 tablet (10 mg total) by mouth at bedtime. 30 tablet 0  ? brimonidine (ALPHAGAN) 0.15 % ophthalmic solution Place 1 drop into both eyes in the morning and at bedtime.     ? chlorhexidine (PERIDEX) 0.12 % solution Use as directed 15 mLs in the mouth or throat 2 (two) times daily. 120 mL 0  ? dicyclomine (BENTYL) 20 MG tablet Take 1 tablet (20 mg total) by mouth 2 (two) times daily as needed for spasms. 30 tablet 0  ? divalproex (DEPAKOTE) 250 MG DR tablet Take 750 mg by mouth 3 (three) times daily.    ? dorzolamide-timolol (COSOPT) 22.3-6.8 MG/ML ophthalmic solution Place 1 drop into both eyes 2 (two) times daily.     ? famotidine (PEPCID) 20 MG tablet Take 1 tablet (20 mg total) by mouth daily.    ? folic acid (FOLVITE) 1 MG tablet Take 1 mg by mouth daily.    ? gentamicin cream (GARAMYCIN) 0.1 % Apply 1 application topically 2 (two) times daily. (Patient not taking: Reported on 09/07/2021) 30 g 1  ? Glucagon (GVOKE HYPOPEN 2-PACK) 1 MG/0.2ML SOAJ Inject 1 Syringe into the skin once as needed (hypoglycemia).    ? Iron-FA-B Cmp-C-Biot-Probiotic (FUSION PLUS PO) Take 1 capsule by mouth daily.    ? LEADER UNIFINE PENTIPS PLUS 32G X 4 MM MISC     ? metoCLOPramide (REGLAN) 10 MG tablet Take 1 tablet (10 mg total) by mouth every 6 (six) hours. 15 tablet 0  ? [place]  70/30 FLEXPEN (70-30) 100 UNIT/ML FlexPen Inject 25-35 Units into the skin See admin instructions. Inject 35 units in the morning and 25 every evening - Sliding scale    ? ondansetron (ZOFRAN-ODT) 4 MG disintegrating tablet Take 4 mg by mouth every 8 (eight) hours as needed for nausea or vomiting.    ? pantoprazole (PROTONIX) 40 MG tablet Take 40 mg by mouth daily.    ? polyethylene glycol (MIRALAX / GLYCOLAX) 17 g packet Take 17 g by mouth daily as needed for mild constipation. (Patient taking differently: Take 17 g by mouth daily as  needed for moderate constipation.) 14 each 0  ? potassium chloride (KLOR-CON) 10 MEQ tablet Take 1 tablet (10 mEq total) by mouth daily for 5 days. 5 tablet 0  ? senna-docusate (SENOKOT-S) 8.6-50 MG tablet Take 1 tablet by mouth 2 (two) times daily. (Patient not taking: No sig reported) 60 tablet 1  ? sitaGLIPtin (JANUVIA) 25 MG tablet Take 25 mg by mouth daily.     ? sucralfate (CARAFATE) 1 g tablet Take 1 g by mouth 4 (four) times daily.    ? torsemide (DEMADEX) 20 MG tablet Take 40 mg by mouth daily. At noon    ? ziprasidone (GEODON) 20 MG capsule Take 1 tab with breakfast and 2 tabs at HS (Patient taking differently: Take 20-40 mg by mouth See admin instructions. Take 20 mg capsule with breakfast and 40 mg at bedtime) 90 capsule 1  ? ?No current facility-administered medications for this visit.  ? ? ?Allergies as of 10/22/2021 - Review Complete 09/07/2021  ?Allergen Reaction Noted  ? Donnatal [phenobarbital-belladonna alk] Anaphylaxis and Other (See Comments) 07/12/2015  ? Fish allergy Anaphylaxis, Hives, and Rash 01/14/2015  ? Haldol [haloperidol lactate] Other (See Comments) 08/07/2015  ? Lidocaine Anaphylaxis and Other (See Comments) 07/12/2015  ? Maalox [calcium carbonate antacid] Anaphylaxis and Other (See Comments) 07/12/2015  ? Shellfish allergy Hives 09/27/2019  ? Aspirin Hives and Itching 03/22/2009  ? Doxycycline Hives, Nausea And Vomiting, and Other (See Comments) 03/20/2014  ? Keflex [cephalexin] Hives, Itching, and Nausea And Vomiting 06/22/2017  ? Marinol [dronabinol] Nausea And Vomiting and Other (See Comments) 05/28/2018  ? Other Swelling 03/13/2020  ? Penicillins Hives, Itching, Other (See Comments), and Rash 03/22/2009  ? Tramadol Itching and Nausea And Vomiting 07/21/2021  ? Bactrim [sulfamethoxazole-trimethoprim] Itching 12/06/2014  ? ? ?Past Medical History:  ?Diagnosis Date  ? Anemia   ? Anxiety   ? CHF (congestive heart failure) ([place])   ? diastolic dysfunction  ? Chronic abdominal pain   ?  COVID-19 07/2019  ? Depression   ? Esophagitis   ? ESRD on hemodialysis ([place])   ? undergoing evaluation for transplant  ? Eye hemorrhage   ? Gastritis   ? Gastroparesis   ? GERD (gastroesophageal reflux disease)   ? Glaucoma   ? Helicobacter pylori (H. pylori) infection 2016  ? ERADICATION DOCUMENTED VIA STOOL TEST in AUG 2016 at [facility]  ? Hiccups   ? Hypertension   ? Nausea and vomiting   ? chronic, recurrent  ? Neuropathy   ? feet  ? Renal insufficiency   ? Type 2 diabetes mellitus with complications ([place])   ? diagnosed around age 71  ? Vitreous hemorrhage of left eye ([place]) 12/15/2019  ? ? ?Past Surgical History:  ?Procedure Laterality Date  ? AMPUTATION TOE Right   ? great toe  ? AV FISTULA INSERTION W/ RF MAGNETIC GUIDANCE Left 04/06/2019  ? Procedure: AV FISTULA INSERTION  W/RF MAGNETIC GUIDANCE;  Surgeon: Katha Cabal, MD;  Location: [facility];  Service: Cardiovascular;  Laterality: Left;  ? AV FISTULA PLACEMENT Left 05/06/2019  ? Procedure: INSERTION OF ARTERIOVENOUS (AV) GORE-TEX GRAFT ARM ( FOREARM LOOP );  Surgeon: Katha Cabal, MD;  Location: [facility];  Service: Vascular;  Laterality: Left;  ? AV FISTULA PLACEMENT Left 03/21/2020  ? Procedure: INSERTION OF ARTERIOVENOUS (AV) GORE-TEX GRAFT ARM ( BRACHIAL AXILLARY );  Surgeon: Katha Cabal, MD;  Location: [facility];  Service: Vascular;  Laterality: Left;  ? CATARACT EXTRACTION W/PHACO Left 05/19/2016  ? Procedure: CATARACT EXTRACTION PHACO AND INTRAOCULAR LENS PLACEMENT LEFT EYE;  Surgeon: Tonny Branch, MD;  Location: [facility];  Service: Ophthalmology;  Laterality: Left;  CDE: 7.30  ? CATARACT EXTRACTION W/PHACO Right 06/23/2016  ? Procedure: CATARACT EXTRACTION PHACO AND INTRAOCULAR LENS PLACEMENT RIGHT EYE CDE=9.87;  Surgeon: Tonny Branch, MD;  Location: [facility];  Service: Ophthalmology;  Laterality: Right;  right  ? DIALYSIS/PERMA CATHETER REMOVAL N/A 07/19/2019  ? Procedure: DIALYSIS/PERMA CATHETER REMOVAL;  Surgeon: Katha Cabal,  MD;  Location: [facility];  Service: Cardiovascular;  Laterality: N/A;  ? ESOPHAGOGASTRODUODENOSCOPY  2015  ? Dr. Britta Mccreedy  ? ESOPHAGOGASTRODUODENOSCOPY N/A 10/26/2014  ? RMR: Distal esophagitit

## 2021-10-23 DIAGNOSIS — Z992 Dependence on renal dialysis: Secondary | ICD-10-CM | POA: Diagnosis not present

## 2021-10-23 DIAGNOSIS — T8744 Infection of amputation stump, left lower extremity: Secondary | ICD-10-CM | POA: Diagnosis not present

## 2021-10-23 DIAGNOSIS — N2581 Secondary hyperparathyroidism of renal origin: Secondary | ICD-10-CM | POA: Diagnosis not present

## 2021-10-23 DIAGNOSIS — L97519 Non-pressure chronic ulcer of other part of right foot with unspecified severity: Secondary | ICD-10-CM | POA: Diagnosis not present

## 2021-10-23 DIAGNOSIS — L97423 Non-pressure chronic ulcer of left heel and midfoot with necrosis of muscle: Secondary | ICD-10-CM | POA: Diagnosis not present

## 2021-10-23 DIAGNOSIS — B957 Other staphylococcus as the cause of diseases classified elsewhere: Secondary | ICD-10-CM | POA: Diagnosis not present

## 2021-10-23 DIAGNOSIS — N186 End stage renal disease: Secondary | ICD-10-CM | POA: Diagnosis not present

## 2021-10-23 DIAGNOSIS — E1121 Type 2 diabetes mellitus with diabetic nephropathy: Secondary | ICD-10-CM | POA: Diagnosis not present

## 2021-10-23 DIAGNOSIS — R531 Weakness: Secondary | ICD-10-CM | POA: Diagnosis not present

## 2021-10-24 ENCOUNTER — Encounter: Payer: Self-pay | Admitting: Cardiology

## 2021-10-24 DIAGNOSIS — R112 Nausea with vomiting, unspecified: Secondary | ICD-10-CM | POA: Diagnosis not present

## 2021-10-24 DIAGNOSIS — E1121 Type 2 diabetes mellitus with diabetic nephropathy: Secondary | ICD-10-CM | POA: Diagnosis not present

## 2021-10-24 DIAGNOSIS — R531 Weakness: Secondary | ICD-10-CM | POA: Diagnosis not present

## 2021-10-24 DIAGNOSIS — F39 Unspecified mood [affective] disorder: Secondary | ICD-10-CM | POA: Diagnosis not present

## 2021-10-24 DIAGNOSIS — N186 End stage renal disease: Secondary | ICD-10-CM | POA: Diagnosis not present

## 2021-10-25 DIAGNOSIS — N186 End stage renal disease: Secondary | ICD-10-CM | POA: Diagnosis not present

## 2021-10-25 DIAGNOSIS — B957 Other staphylococcus as the cause of diseases classified elsewhere: Secondary | ICD-10-CM | POA: Diagnosis not present

## 2021-10-25 DIAGNOSIS — Z992 Dependence on renal dialysis: Secondary | ICD-10-CM | POA: Diagnosis not present

## 2021-10-25 DIAGNOSIS — N2581 Secondary hyperparathyroidism of renal origin: Secondary | ICD-10-CM | POA: Diagnosis not present

## 2021-10-25 DIAGNOSIS — T8744 Infection of amputation stump, left lower extremity: Secondary | ICD-10-CM | POA: Diagnosis not present

## 2021-10-28 ENCOUNTER — Ambulatory Visit (HOSPITAL_COMMUNITY): Payer: Medicare HMO | Attending: Family Medicine | Admitting: Physical Therapy

## 2021-10-28 DIAGNOSIS — T8744 Infection of amputation stump, left lower extremity: Secondary | ICD-10-CM | POA: Diagnosis not present

## 2021-10-28 DIAGNOSIS — B957 Other staphylococcus as the cause of diseases classified elsewhere: Secondary | ICD-10-CM | POA: Diagnosis not present

## 2021-10-28 DIAGNOSIS — E1121 Type 2 diabetes mellitus with diabetic nephropathy: Secondary | ICD-10-CM | POA: Diagnosis not present

## 2021-10-28 DIAGNOSIS — Z992 Dependence on renal dialysis: Secondary | ICD-10-CM | POA: Diagnosis not present

## 2021-10-28 DIAGNOSIS — N186 End stage renal disease: Secondary | ICD-10-CM | POA: Diagnosis not present

## 2021-10-28 DIAGNOSIS — F39 Unspecified mood [affective] disorder: Secondary | ICD-10-CM | POA: Diagnosis not present

## 2021-10-28 DIAGNOSIS — R531 Weakness: Secondary | ICD-10-CM | POA: Diagnosis not present

## 2021-10-28 DIAGNOSIS — N2581 Secondary hyperparathyroidism of renal origin: Secondary | ICD-10-CM | POA: Diagnosis not present

## 2021-10-28 DIAGNOSIS — R112 Nausea with vomiting, unspecified: Secondary | ICD-10-CM | POA: Diagnosis not present

## 2021-10-30 DIAGNOSIS — Z992 Dependence on renal dialysis: Secondary | ICD-10-CM | POA: Diagnosis not present

## 2021-10-30 DIAGNOSIS — E11649 Type 2 diabetes mellitus with hypoglycemia without coma: Secondary | ICD-10-CM | POA: Diagnosis not present

## 2021-10-30 DIAGNOSIS — Z89412 Acquired absence of left great toe: Secondary | ICD-10-CM | POA: Diagnosis not present

## 2021-10-30 DIAGNOSIS — L97423 Non-pressure chronic ulcer of left heel and midfoot with necrosis of muscle: Secondary | ICD-10-CM | POA: Diagnosis not present

## 2021-10-30 DIAGNOSIS — T8744 Infection of amputation stump, left lower extremity: Secondary | ICD-10-CM | POA: Diagnosis not present

## 2021-10-30 DIAGNOSIS — L97519 Non-pressure chronic ulcer of other part of right foot with unspecified severity: Secondary | ICD-10-CM | POA: Diagnosis not present

## 2021-10-30 DIAGNOSIS — N2581 Secondary hyperparathyroidism of renal origin: Secondary | ICD-10-CM | POA: Diagnosis not present

## 2021-10-30 DIAGNOSIS — B957 Other staphylococcus as the cause of diseases classified elsewhere: Secondary | ICD-10-CM | POA: Diagnosis not present

## 2021-10-30 DIAGNOSIS — E1121 Type 2 diabetes mellitus with diabetic nephropathy: Secondary | ICD-10-CM | POA: Diagnosis not present

## 2021-10-30 DIAGNOSIS — N186 End stage renal disease: Secondary | ICD-10-CM | POA: Diagnosis not present

## 2021-11-01 DIAGNOSIS — T8744 Infection of amputation stump, left lower extremity: Secondary | ICD-10-CM | POA: Diagnosis not present

## 2021-11-01 DIAGNOSIS — N2581 Secondary hyperparathyroidism of renal origin: Secondary | ICD-10-CM | POA: Diagnosis not present

## 2021-11-01 DIAGNOSIS — Z992 Dependence on renal dialysis: Secondary | ICD-10-CM | POA: Diagnosis not present

## 2021-11-01 DIAGNOSIS — N186 End stage renal disease: Secondary | ICD-10-CM | POA: Diagnosis not present

## 2021-11-01 DIAGNOSIS — B957 Other staphylococcus as the cause of diseases classified elsewhere: Secondary | ICD-10-CM | POA: Diagnosis not present

## 2021-11-04 DIAGNOSIS — T8744 Infection of amputation stump, left lower extremity: Secondary | ICD-10-CM | POA: Diagnosis not present

## 2021-11-04 DIAGNOSIS — N186 End stage renal disease: Secondary | ICD-10-CM | POA: Diagnosis not present

## 2021-11-04 DIAGNOSIS — B957 Other staphylococcus as the cause of diseases classified elsewhere: Secondary | ICD-10-CM | POA: Diagnosis not present

## 2021-11-04 DIAGNOSIS — N2581 Secondary hyperparathyroidism of renal origin: Secondary | ICD-10-CM | POA: Diagnosis not present

## 2021-11-04 DIAGNOSIS — Z992 Dependence on renal dialysis: Secondary | ICD-10-CM | POA: Diagnosis not present

## 2021-11-05 DIAGNOSIS — K089 Disorder of teeth and supporting structures, unspecified: Secondary | ICD-10-CM | POA: Diagnosis not present

## 2021-11-06 DIAGNOSIS — N186 End stage renal disease: Secondary | ICD-10-CM | POA: Diagnosis not present

## 2021-11-06 DIAGNOSIS — B957 Other staphylococcus as the cause of diseases classified elsewhere: Secondary | ICD-10-CM | POA: Diagnosis not present

## 2021-11-06 DIAGNOSIS — T8744 Infection of amputation stump, left lower extremity: Secondary | ICD-10-CM | POA: Diagnosis not present

## 2021-11-06 DIAGNOSIS — L97423 Non-pressure chronic ulcer of left heel and midfoot with necrosis of muscle: Secondary | ICD-10-CM | POA: Diagnosis not present

## 2021-11-06 DIAGNOSIS — Z992 Dependence on renal dialysis: Secondary | ICD-10-CM | POA: Diagnosis not present

## 2021-11-06 DIAGNOSIS — L97519 Non-pressure chronic ulcer of other part of right foot with unspecified severity: Secondary | ICD-10-CM | POA: Diagnosis not present

## 2021-11-06 DIAGNOSIS — N2581 Secondary hyperparathyroidism of renal origin: Secondary | ICD-10-CM | POA: Diagnosis not present

## 2021-11-08 DIAGNOSIS — Z992 Dependence on renal dialysis: Secondary | ICD-10-CM | POA: Diagnosis not present

## 2021-11-08 DIAGNOSIS — N186 End stage renal disease: Secondary | ICD-10-CM | POA: Diagnosis not present

## 2021-11-09 DIAGNOSIS — 419620001 Death: Secondary | SNOMED CT | POA: Diagnosis not present

## 2021-11-09 DEATH — deceased

## 2021-11-27 ENCOUNTER — Other Ambulatory Visit: Payer: Medicare HMO

## 2021-11-28 ENCOUNTER — Encounter (INDEPENDENT_AMBULATORY_CARE_PROVIDER_SITE_OTHER): Payer: Medicare HMO | Admitting: Ophthalmology
